# Patient Record
Sex: Male | Born: 1935 | Race: White | Hispanic: No | Smoking: Former smoker
Health system: Southern US, Community
[De-identification: ages and names within clinical notes are randomized; demographics above are authoritative.]

## PROBLEM LIST (undated history)

## (undated) DIAGNOSIS — I6529 Occlusion and stenosis of unspecified carotid artery: Secondary | ICD-10-CM

## (undated) DIAGNOSIS — I10 Essential (primary) hypertension: Secondary | ICD-10-CM

## (undated) DIAGNOSIS — K5792 Diverticulitis of intestine, part unspecified, without perforation or abscess without bleeding: Secondary | ICD-10-CM

## (undated) DIAGNOSIS — D62 Acute posthemorrhagic anemia: Secondary | ICD-10-CM

## (undated) DIAGNOSIS — J449 Chronic obstructive pulmonary disease, unspecified: Secondary | ICD-10-CM

## (undated) DIAGNOSIS — I639 Cerebral infarction, unspecified: Secondary | ICD-10-CM

## (undated) DIAGNOSIS — I2510 Atherosclerotic heart disease of native coronary artery without angina pectoris: Secondary | ICD-10-CM

## (undated) DIAGNOSIS — F17201 Nicotine dependence, unspecified, in remission: Secondary | ICD-10-CM

## (undated) DIAGNOSIS — K625 Hemorrhage of anus and rectum: Secondary | ICD-10-CM

## (undated) DIAGNOSIS — R933 Abnormal findings on diagnostic imaging of other parts of digestive tract: Secondary | ICD-10-CM

## (undated) DIAGNOSIS — C649 Malignant neoplasm of unspecified kidney, except renal pelvis: Secondary | ICD-10-CM

## (undated) DIAGNOSIS — M1990 Unspecified osteoarthritis, unspecified site: Secondary | ICD-10-CM

## (undated) HISTORY — DX: Malignant neoplasm of unspecified kidney, except renal pelvis: C64.9

## (undated) HISTORY — DX: Chronic obstructive pulmonary disease, unspecified: J44.9

## (undated) HISTORY — PX: KIDNEY SURGERY: SHX687

## (undated) HISTORY — PX: OTHER SURGICAL HISTORY: SHX169

## (undated) HISTORY — PX: COLON SURGERY: SHX602

---

## 1997-07-27 ENCOUNTER — Ambulatory Visit (HOSPITAL_COMMUNITY): Admission: RE | Admit: 1997-07-27 | Discharge: 1997-07-27 | Payer: Self-pay | Admitting: Internal Medicine

## 1999-01-02 ENCOUNTER — Encounter: Payer: Self-pay | Admitting: Internal Medicine

## 1999-01-02 ENCOUNTER — Emergency Department (HOSPITAL_COMMUNITY): Admission: EM | Admit: 1999-01-02 | Discharge: 1999-01-02 | Payer: Self-pay | Admitting: Emergency Medicine

## 1999-01-02 ENCOUNTER — Encounter: Payer: Self-pay | Admitting: General Surgery

## 1999-12-23 ENCOUNTER — Encounter: Payer: Self-pay | Admitting: Orthopedic Surgery

## 1999-12-30 ENCOUNTER — Encounter: Payer: Self-pay | Admitting: Orthopedic Surgery

## 1999-12-30 ENCOUNTER — Inpatient Hospital Stay (HOSPITAL_COMMUNITY): Admission: RE | Admit: 1999-12-30 | Discharge: 2000-01-03 | Payer: Self-pay | Admitting: Orthopedic Surgery

## 2003-03-07 ENCOUNTER — Encounter: Admission: RE | Admit: 2003-03-07 | Discharge: 2003-03-07 | Payer: Self-pay | Admitting: Orthopedic Surgery

## 2003-04-05 ENCOUNTER — Encounter: Admission: RE | Admit: 2003-04-05 | Discharge: 2003-04-05 | Payer: Self-pay | Admitting: Orthopedic Surgery

## 2004-06-03 ENCOUNTER — Encounter: Admission: RE | Admit: 2004-06-03 | Discharge: 2004-06-03 | Payer: Self-pay | Admitting: Orthopedic Surgery

## 2005-04-27 ENCOUNTER — Encounter: Admission: RE | Admit: 2005-04-27 | Discharge: 2005-04-27 | Payer: Self-pay | Admitting: Neurological Surgery

## 2005-05-14 ENCOUNTER — Inpatient Hospital Stay (HOSPITAL_COMMUNITY): Admission: RE | Admit: 2005-05-14 | Discharge: 2005-05-21 | Payer: Self-pay | Admitting: Neurological Surgery

## 2005-05-27 ENCOUNTER — Ambulatory Visit (HOSPITAL_COMMUNITY): Admission: RE | Admit: 2005-05-27 | Discharge: 2005-05-27 | Payer: Self-pay | Admitting: Neurological Surgery

## 2005-05-27 ENCOUNTER — Encounter: Payer: Self-pay | Admitting: Vascular Surgery

## 2006-01-20 ENCOUNTER — Ambulatory Visit (HOSPITAL_COMMUNITY): Admission: RE | Admit: 2006-01-20 | Discharge: 2006-01-20 | Payer: Self-pay | Admitting: Internal Medicine

## 2008-03-16 ENCOUNTER — Encounter: Admission: RE | Admit: 2008-03-16 | Discharge: 2008-03-16 | Payer: Self-pay | Admitting: Internal Medicine

## 2009-02-23 DIAGNOSIS — I639 Cerebral infarction, unspecified: Secondary | ICD-10-CM

## 2009-02-23 HISTORY — DX: Cerebral infarction, unspecified: I63.9

## 2010-07-11 NOTE — Discharge Summary (Signed)
[facility]  Patient:    Wayne Meadows, Wayne Meadows                     MRN: [id number] Adm. Date:  [id number] Disc. Date: [id number] Attending:  Tarri Glenn Page Dictator:   Alexzandrew L. Perkins, P.A.-C.                           Discharge Summary  ADMITTING DIAGNOSES: 1. Avascular necrosis, right femoral head. 2. Hypertension. 3. Tobacco dependency.  DISCHARGE DIAGNOSES: 1. Avascular necrosis, right femoral head, status post right total hip    replacement arthroplasty. 2. Postoperative hemorrhagic anemia. 3. Hypertension. 4. Tobacco dependency.  PROCEDURE:  Patient was taken to the operating room on December 30, 1999, and underwent right total hip replacement arthroplasty.  Surgeon:  Tarri Glenn, M.D.  Assistant:  Basil Dess, M.D.  Surgery under spinal anesthesia.  CONSULTS:  None.  HISTORY OF PRESENT ILLNESS:  Patient is a 76 year old male who has been followed for and seen in second opinion regarding his right hip.  He has had increasing pain for some time now.  MRI was performed in January 2001 which demonstrated some mild osteonecrosis involving the right femoral head.  He was felt to have some mild flattening of the femoral head with 40% articular surface involved anteriorly.  He has tried conservative measures including NSAIDS with no relief.  He has had increasing pain and debility and it is felt he would benefit from undergoing surgical intervention.  Patient was subsequently admitted to the hospital.  LABORATORY DATA:  CBC on admission showed a hemoglobin of 12.1, hematocrit 35.6, white cell count elevated at 10.6, red cell count 3.84.  Serial H&Hs were followed.  Last noted hemoglobin and hematocrit at 9.1 and hematocrit of 25.9.  PT and PTT on admission were 14.1 and 30 respectively, with an INR of 1.2.  Serial pro times followed per Coumadin protocol.  Last noted PT-INR was 19.4 and 2.1 respectively.  Chemistry panel on admission:   Elevated glucose of 124. Remaining chemistry panel all within normal limits.  Follow-up BMET on 12/31/99:  Glucose remained stable at 124.  Remaining BMET all within normal limits.  Urinalysis on admission positive for protein and ______ crystals, and 0 to 5 white cells, otherwise urine was negative.  Blood group type O positive.  Chest x-ray:  No active disease, no interval change since previous exam.  This was dated December 23, 1999.  Right hip film shows osteoarthritic changes over her right hip.  HOSPITAL COURSE:  Patient was admitted to [facility], taken to the operating room, underwent the above-stated procedure without complications. Patient tolerated the procedure well, was later returned to the recovery room and then to the orthopedic floor for continued postoperative care.  Patient was placed on PC analgesia for pain control following surgery.  He was weaned off his PCA by postoperative day #2.  Dressing changes were initiated and, on postoperative day #2, wound was healing well.  He did have some slight postoperative temperature noted on postoperative day #2 of 101.9, treated with an incentive spirometer and antipyretics.  Physical therapy was consulted postoperatively.  Patient progressed fairly well with physical therapy, only ambulating 20 feet by postoperative day #2, however increased up to 120 feet by postoperative day #3.  He was ambulating greater than 200 feet prior to discharge.  Patient was placed on Coumadin postoperatively.  He continued to progress with physical  therapy.  He was noted again to have a maximum temperature on the evening prior to admission of 101.2; however, temperature did come back down and was 99 prior to discharge.  Patient is doing quite well.  She has been weaned over to p.o. analgesics and it was decided that patient could be discharged home on January 03, 2000.  DISCHARGE PLAN:  Patient is discharged home on January 03, 2000.  DISCHARGE DIAGNOSES:  Please see above.  DISCHARGE MEDICATIONS: 1. Percocet p.r.n. pain. 2. Robaxin p.r.n. spasm. 3. Coumadin as per pharmacy protocol. 4. Colace as directed.  DIET:  As tolerated.  ACTIVITY:  Touchdown weightbearing.  Home health PT, home health nursing. Patient is placed on hip precautions.  FOLLOW-UP:  Follow up in two weeks.  DISPOSITION:  Home.  CONDITION UPON DISCHARGE:  Improved. DD:  02/04/00 TD:  02/04/00 Job: [id number]

## 2010-07-11 NOTE — H&P (Signed)
[facility]  Patient:    Wayne Meadows, Wayne Meadows [facility])                MRN: [id number] Adm. Date:  12/30/99 Attending:  Jeneen Rinks P. Aplington, M.D. Dictator:   Jenetta Loges, P.A. CC:         Kirtland Bouchard, M.D.   History and Physical  DATE OF BIRTH:  October 17, 1934  DATE OF HISTORY AND PHYSICAL:  December 22, 1999  CHIEF COMPLAINT:  Right hip pain.  HISTORY OF PRESENT ILLNESS:  This is a 76 year old male who was initially seen in our office for a second opinion regarding his right hip. He has had about a year of increasing right hip pain. Initially, he had MRI performed in March 03, 1999, which demonstrated some mild osteonecrosis involving the right femoral head. At that time, he was felt to have mild flattening of the femoral head with 40% articular surface involved anteriorly. He was tried on some NSAIDs with no relief. Over the past 8 months, he has had increasing pain and disability. He was seen in our office 6 months later. Follow-up x-rays were obtained which demonstrated collapse of the femoral head and avascular necrosis much more apparent with significant joint destruction. Since that time, the patient has been maintained on nonsteroidals. A total hip replacement was discussed with him at length. He waited several months. However, he was significant night pain, start up pain, and the disability secondary to his hip and wished to proceed with total hip arthroplasty. Risks, benefits discussed with the patient. He did donate 2 units of autologous blood for the procedure in preparation.  PAST MEDICAL HISTORY: 1. Hypertension. 2. Tobacco dependence.  PAST SURGICAL HISTORY:  Appendectomy.  CURRENT MEDICATIONS: 1. Accupril 20 mg q.d. 2. Mobic 7.5 mg b.i.d. 3. Glucosamine sulfate 1000 mg b.i.d. 4. Vitamins over-the-counter, as well as vitamin E over-the-counter.  FAMILY HISTORY:  Significant for mother with heart disease.  SOCIAL HISTORY:   Smokes at least a pack per day. Social alcohol intake. Primary physician is Kirtland Bouchard, M.D. who has provided medical clearance. He also lives in a three-level home but can be maintained on one level. Living circumstances postoperatively:  He is married and has a wife that will assist him postoperatively.  REVIEW OF SYSTEMS:  The patient denies any recent fevers, chills, night sweats. No bleeding tendencies. CNS:  No blurred or double vision, seizures, or paralysis. RESPIRATORY:  No shortness of breath, productive cough, or hemoptysis. CARDIOVASCULAR:  No chest pain, angina, orthopnea. GI:  No nausea, vomiting, constipation, melena, bloody stools. GENITOURINARY:  No dysuria or hematuria. MUSCULOSKELETAL:  As pertinent to present illness.  PHYSICAL EXAMINATION:  GENERAL:  Well-developed, well-nourished, 76 year old male. Walks with an antalgic gait.  VITAL SIGNS:  Blood pressure 158/82.  HEENT:  Normocephalic. Extraocular motions intact.  NECK:  Supple. No lymphadenopathy. No carotid bruits appreciated on exam.  CHEST:  Clear to auscultation bilaterally. No rales or rhonchi.  HEART:  Regular rate and rhythm. No murmurs, rubs, gallops, heaves, or thrills.  ABDOMEN:  Positive bowel sounds. Soft, nontender.  EXTREMITIES:  Right lower extremity shows decreased range of motion. Internal rotation significantly limited. Positive intact distal pulses.  IMPRESSION: 1. Avascular necrosis right femoral head. 2. Hypertension. 3. Tobacco dependence.  PLAN:  The patient will be admitted for right total hip arthroplasty. DD:  12/23/99 TD:  12/23/99 Job: [id number]

## 2010-07-11 NOTE — Discharge Summary (Signed)
NAME:  FOYE, COMAN NO.:  [id number]   MEDICAL RECORD NO.:  [id number]          PATIENT TYPE:  INP   LOCATION:  [id number]                         FACILITY:  [facility]   PHYSICIAN:  Earleen Newport, M.D.  DATE OF BIRTH:  11-01-1934   DATE OF ADMISSION:  05/14/2005  DATE OF DISCHARGE:  05/21/2005                                 DISCHARGE SUMMARY   ADMITTING DIAGNOSES:  1.  Spondylosis and herniated nucleus pulposus, L2-L3 and L3-L4.  2.  Spondylosis, L4-L5 and L5-S1 with right lumbar radiculopathy.   DISCHARGE AND FINAL DIAGNOSES:  1.  Spondylosis and herniated nucleus pulposus, L2-L3 and L3-L4.  2.  Spondylosis, L4-L5 and L5-S1 with right lumbar radiculopathy.  3.  Urinary retention.  4.  Acute blood loss anemia secondary to surgery.  5.  Postoperative ileus.   CONDITION ON DISCHARGE:  Improving.   HOSPITAL COURSE:  Mr. Beaver Taub is a 76 year old individual who has had  significant back and right lower extremity pain.  He was found to have  severely degenerated hip disease and underwent a hip arthroplasty several  years ago but he has had kept right buttock and hip pain that has been  unrelieved with conservative measures including evaluation and treatment  with epidural steroid injections.  An MRI demonstrated he had significant  disc degeneration with chronically herniated discs at L2-L3 and L3-L4.  He  had severely degenerated discs at L4-L5 and L5-S1.  He was advised regarding  surgical decompression and stabilization from L2 to the sacrum and was  admitted on March 22 and underwent this procedure.  He had segmental  fixation from L2 to the sacrum with interbody fixation and decompression, L2-  L3 and L3-L4.   Since the time of surgery, the patient has been gradually mobilized.  He had  acute blood loss anemia and had urinary retention.  He required the  placement of a Foley catheter which was removed on the second postoperative  day but then was replaced  on day 3 through day 5.  He was started on Flomax  and this gradually improved his respiratory status.  His incision has been  clean and dry.  He has been voiding spontaneously.  He had a brief case of a  postoperative ileus and this is resolved.  He has been seen by physical  therapy and has been given a rolling walker and a 3-in-1 for home use.  He  will be sent home on Percocet #60 without refills and Flexeril #30, 10 mg  one t.i.d. p.r.n. with p.r.n. refills.  He will be seen in the office in  three weeks' time.  His incision is clean and dry.  His staples have been  removed and the wound was dressed with Steri-Strips.   CONDITION ON DISCHARGE:  Improving.     Earleen Newport, M.D.  Electronically Signed    HJE/MEDQ  D:  05/21/2005  T:  05/22/2005  Job:  [id number]

## 2010-07-11 NOTE — Op Note (Signed)
NAME:  Wayne Meadows, Wayne Meadows NO.:  [id number]   MEDICAL RECORD NO.:  [id number]          PATIENT TYPE:  INP   LOCATION:  3310                         FACILITY:  [facility]   PHYSICIAN:  Earleen Newport, M.D.  DATE OF BIRTH:  January 18, 1935   DATE OF PROCEDURE:  05/14/2005  DATE OF DISCHARGE:                                 OPERATIVE REPORT   PREOPERATIVE DIAGNOSES:  1.  Spondylosis and herniated nucleus pulposus L2-3, L3-4.  2.  Spondylosis L4-5 and L5-S1 with right lumbar radiculopathy.   POSTOPERATIVE DIAGNOSES:  1.  Spondylosis and herniated nucleus pulposus L2-3, L3-4.  2.  Spondylosis L4-5 and L5-S1 with right lumbar radiculopathy.   OPERATIONS:  Bilateral diskectomy L2-3, L3-4 posterior interbody arthrodesis  with peak bone spacer, local autograft and allograft L2-3, L3-4, segmental  fixation L2 to sacrum with pedicle screws and posterolateral arthrodesis  with autograft, allograft and Infuse L2 to sacrum.   SURGEON:  Earleen Newport, M.D.   FIRST ASSISTANT:  Elizabeth Sauer, M.D.   ANESTHESIA:  General endotracheal.   INDICATIONS:  [name] is a 76 year old individual who has had  significant back and right lower extremity pain.  He initially was found to  have a severely degenerated hip and underwent hip arthroplasty.  However, he  had a radicular pain in the right buttock and right hip.  He failed  extensive efforts at conservative management though he did get some brief  temporary relief with the use of epidural steroids.  He was evaluated with a  new RI of his lumbar spine and advised regarding surgical decompression that  would involve L2 down to the sacrum.  He is now taken to the operating room  for this procedure.   DESCRIPTION OF PROCEDURE:  The patient was brought to the operating room  supine on the stretcher.  After the smooth induction of general endotracheal  anesthesia, he was turned prone with all the bony prominences being  carefully  padded and protected.  The back was then shaved with a clipper and  prepped with DuraPrep and draped in sterile fashion.  A midline incision was  created over the lower lumbar spine.  Dissection was carried down to lumbar  dorsal fashion.  The first spinous processes were identified as L3-L4 with  localizing radiograph.  Then the interlaminar spaces at L3-4, L4-5 and L5-S1  and L2-3 were cleared using monopolar cautery and periosteal elevators and  packing the wound to the side.  Once this area was uncovered, the facet  joints at L2-3, L3-4, L4-5 and L5-S1 were uncovered, and then a laminotomy  was created removing the inferior margin lamina of L2 out to and including  the entirety of the facet which was also done at L3.  This procedure was  performed bilaterally.  The epidural tissues were then dissected and  epidural veins were cauterized and divided and the common dural tube was  retracted medially.  There was a  broad based bulge at the L2-3 level with a  partially calcified disk protrusion at this level, and this was opened and  removed in a piecemeal fashion with a combination of Kerrison rongeurs and  Kerrison punches.  The disk space was then eventually evacuated in its  entirety using the dissecting tools.  L3-4 was then approached in a similar  fashion and again a complete diskectomy was performed. Once the disk had  been all dissected away of the endplates were prepared using a series of  curettes to remove any cartilaginous end plate that could be had.  A self-  retaining disk spreader was also used to expand the interspace and allow for  better visualization of the disk space itself.  Once this was completed, the  interspaces were sized and 10 mm Peak spaces were placed with bone within  them at the L2-3 level.  The 9 mm spacers were placed at L3-4.  On the right  lateral gutter, there was noted to be a substantial protrusion of the disk  in the extraforaminal space at L3-4, and  this was resected.  The disk  herniation was definitively much larger at the L3-4 level than it was at L2-  3.  Once the placement of the peak spacers with the bone graft was  completed, the bone graft had been prepared with the autograft from the  laminotomies mixed with 10 cc of demineralized bone matrix putty.  The  interspace was also packed with a strip of Infuse.  With this being  completed, the right sided lateral recess was cleared with a combination of  curets and rongeurs and the common dural tube on the right side was  dissected free.  The exiting nerve root at L5 was cleared on the right side  only as the patient had primarily right lumbar radiculopathy.  The right-  sided S1 nerve root was similarly cleared by a laminotomy at the L5-S1  level.  To allow better placement of pedicle screws inferior portion of the  facet joints at L4-5, L5-S1 were opened, and the inferior portion of the  facet was removed itself.  This was prepared for bone graft, and the face of  the facet joint was completely decorticated very carefully to allow more  placement of bone graft material.  Then with fluoroscopy being brought into  the field, pedicle screws were placed at the sacrum L5, L4, L3 and L2 up one  side and then down the other.  Care was taken to make sure that the  placement of pedicle screws was symmetric and within the pedicle and each  pedicle was not only checked radiographically, but also sounded after being  tapped to the 6.5 mm size.  The 6.5 mm x 45 mm screws were used for this  purpose.  Once this procedure was completed and final fluoroscopic views  were obtained, the rods were then contoured to fit between the screws from  L2 down to the sacrum, and the rods were provisionally placed and then the  screws were tightened from the outside in using the appropriate torque  wrench.  The lateral gutters had been decorticated prior to placement of the hardware, and then these  decorticated areas were then packed with the  remainder of autograft and allograft and Infuse strips.  In addition, along  the posterior lateral borders two strips of VITOSS bone matrix demineralized  tricalcium phosphate sponges were placed in the lateral gutters.  The  sponges had been soaked with a bone marrow aspirate that had been obtained  from the multiple pedicle probes that were placed, and the Jamshidi needle  was used to remove the bone marrow aspirate for a total of 2 cc at each  pedicle site.  Once all the hardware was placed and torqued down and the  bone graft was placed, care was taken to make sure that the lateral recess  and lateral gutters were well decompressed.  The nerve roots themselves in  the exit foramen were covered with some small pledgets of Gelfoam that were  soaked in thrombin and once hemostasis was achieved they were left in place.  The lumbodorsal fascia was then closed with #1 Vicryl interrupted fashion; 2-  0 Vicryl was used in the subcutaneous tissues, 3-0 Vicryl subcuticularly and  surgical staples were ultimately placed on the skin.  The patient tolerated  the procedure well.  Blood loss estimate about 1000 mL, and 400 mL of  CellSaver blood was returned to the patient. During the procedure care was  taken to perform laminotomies, however, the spinous process of L5 and the  spinous process of L2 had each fractured off.  These fractured portions were  harvested and used for bone graft.      Earleen Newport, M.D.  Electronically Signed     HJE/MEDQ  D:  05/14/2005  T:  05/16/2005  Job:  [id number]
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
## 2010-07-11 NOTE — Op Note (Signed)
[facility]  Patient:    Wayne Meadows, Wayne Meadows                     MRN: [id number] Proc. Date: 12/30/99 Adm. 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  OPERATION PERFORMED:  Osteonics total hip replacement, right.  SURGEON:  Dr. Shellia Carwin.  ASSISTANT:  Dr. Basil Dess.  ANESTHESIA:  Spinal.  PATHOLOGY AND JUSTIFICATION FOR PROCEDURE:  As stated in the diagnosis. He is age 76 with good bone ______ of the non-cemented hip was the treatment of choice.  DESCRIPTION OF PROCEDURE:  Prophylactic antibiotics, satisfactory spinal anesthesia, Foley catheter inserted, left lateral decubitus position on the Mark 2 frame, right hip was prepped with duraprep, draped in a sterile field. Ioban employed, posterolateral incision down to the fascia lata. Zickels band was cut. External rotators were detached off the femur with cutting cautery. The gluteus medius was partially detached and tagged with #1 Ethibond. The piriformis was likewise identified, detached and tagged with #1 Ethibond. The hip capsule was then identified and subtotal capsulectomy performed and the hip dislocated. I made my initial cut of the femoral neck just below the femoral head and cleared the piriformis fossa of soft tissue. I then placed a guide pin down in the femoral canal over drilling it followed by the canal finder. I then began the cylindrical reaming process and also the broaching to determined proper sizing. Along the way, I used the greater trochanteric reamer and also made my definitive cut on the femoral neck a finger breadth above the lesser trochanter. We went up to a size 10 which gave a nice fit with minimal cancellous bone off the calcar. We then reamed for the distal tip at a 14  tip reaming up to 14.5 mm. I then went to the acetabulum where the capsulectomy was completed. He had a very deep fossa ovale which was cleared of soft tissue. The plain x-ray was a little deceiving in that the acetabular wall looked quite thick but after leveling off the inner acetabulum down to the level of the fossa ovale, there is basically no bone left at this point. Consequently after using an expanding reamer up to a 62, I took a portion of the second bone I got from the femoral neck removing some of the cortical bone and thinning it out and tailored it to fit the defect in the acetabular wall. I then filled the acetabulum with morcellized cancellous bone from the femoral head. I went through a trial reduction with a +10 cut liner at 12 oclock and this gave a nice stable hip with a +5 neck. Consequently I went ahead and placed the final metal back acetabulum secured with 2 screws superiorly, 1 at 40 mm and 1 at 35 mm. I then went through another trial reduction with the trial liner once again at 12 oclock and found the hip to be very stable and I consequently placed my final polyethylene cup in this position. I then returned to the femur which I irrigated well with antibiotic solution and then I gently placed in the microstructured 127 degree primary secure fit femur until we could not impact it anymore. I then  checked reduction once again with a plus 5 neck and found this to be optimally tight. Prior to the surgery, he had measured about 1/4 inch short and I told him that we would try to equalize his leg lengths as close as possible but that the stability of the fit was the most important thing. I then went ahead and put the final C tapered head impacting it and reducing the hip and taking it through a full range of motion with good stability. The wound was then once again irrigated with antibiotic solution, Zickels band was reconstituted with interrupted #1 Vicryl. Piriformis and  gluteus medius were reattached with #1 Ethibond with the gluteus medius through drill holes. Also reattached external rotators with #1 Vicryl through drill holes. The wound was then closed with interrupted #1 Vicryl and the fascia lata and deep subcutaneous tissue with 2-0 Vicryl and superficial subcutaneous tissue and staples in the skin. Betadine Adaptic dry sterile dressing were applied. He was then gently placed on his back and in an abduction pillow. He was taken to the recovery room in satisfactory condition with no known complications. Estimated blood loss was approximately 300 cc with no blood replacement. DD:  12/30/99 TD:  12/30/99 Job: [id number]

## 2011-02-21 ENCOUNTER — Other Ambulatory Visit: Payer: Self-pay

## 2011-02-21 ENCOUNTER — Emergency Department (HOSPITAL_COMMUNITY): Payer: Medicare Other

## 2011-02-21 ENCOUNTER — Encounter: Payer: Self-pay | Admitting: *Deleted

## 2011-02-21 ENCOUNTER — Inpatient Hospital Stay (HOSPITAL_COMMUNITY)
Admission: EM | Admit: 2011-02-21 | Discharge: 2011-02-25 | DRG: 249 | Disposition: A | Discharge status: Home / Self Care | Payer: Medicare Other | Attending: Cardiology | Admitting: Cardiology

## 2011-02-21 DIAGNOSIS — I200 Unstable angina: Secondary | ICD-10-CM

## 2011-02-21 DIAGNOSIS — I2583 Coronary atherosclerosis due to lipid rich plaque: Secondary | ICD-10-CM | POA: Insufficient documentation

## 2011-02-21 DIAGNOSIS — J449 Chronic obstructive pulmonary disease, unspecified: Secondary | ICD-10-CM

## 2011-02-21 DIAGNOSIS — Z87891 Personal history of nicotine dependence: Secondary | ICD-10-CM

## 2011-02-21 DIAGNOSIS — I679 Cerebrovascular disease, unspecified: Secondary | ICD-10-CM | POA: Diagnosis present

## 2011-02-21 DIAGNOSIS — J4489 Other specified chronic obstructive pulmonary disease: Secondary | ICD-10-CM | POA: Diagnosis present

## 2011-02-21 DIAGNOSIS — Z9861 Coronary angioplasty status: Secondary | ICD-10-CM

## 2011-02-21 DIAGNOSIS — F17201 Nicotine dependence, unspecified, in remission: Secondary | ICD-10-CM

## 2011-02-21 DIAGNOSIS — R935 Abnormal findings on diagnostic imaging of other abdominal regions, including retroperitoneum: Secondary | ICD-10-CM

## 2011-02-21 DIAGNOSIS — I214 Non-ST elevation (NSTEMI) myocardial infarction: Principal | ICD-10-CM | POA: Diagnosis present

## 2011-02-21 DIAGNOSIS — I2510 Atherosclerotic heart disease of native coronary artery without angina pectoris: Secondary | ICD-10-CM | POA: Insufficient documentation

## 2011-02-21 DIAGNOSIS — I2111 ST elevation (STEMI) myocardial infarction involving right coronary artery: Secondary | ICD-10-CM | POA: Diagnosis present

## 2011-02-21 DIAGNOSIS — Z8673 Personal history of transient ischemic attack (TIA), and cerebral infarction without residual deficits: Secondary | ICD-10-CM

## 2011-02-21 DIAGNOSIS — I2582 Chronic total occlusion of coronary artery: Secondary | ICD-10-CM | POA: Diagnosis present

## 2011-02-21 DIAGNOSIS — J441 Chronic obstructive pulmonary disease with (acute) exacerbation: Secondary | ICD-10-CM

## 2011-02-21 HISTORY — DX: Essential (primary) hypertension: I10

## 2011-02-21 HISTORY — DX: Nicotine dependence, unspecified, in remission: F17.201

## 2011-02-21 HISTORY — DX: Atherosclerotic heart disease of native coronary artery without angina pectoris: I25.10

## 2011-02-21 HISTORY — DX: Cerebral infarction, unspecified: I63.9

## 2011-02-21 MED ORDER — NITROGLYCERIN 0.4 MG SL SUBL
0.4000 mg | SUBLINGUAL_TABLET | SUBLINGUAL | Status: AC | PRN
  Administered 2011-02-22 (×3): 0.4 mg via SUBLINGUAL
  Filled 2011-02-21: qty 25

## 2011-02-21 MED ORDER — ASPIRIN 81 MG PO CHEW
324.0000 mg | CHEWABLE_TABLET | Freq: Once | ORAL | Status: AC
  Administered 2011-02-21: 324 mg via ORAL
  Filled 2011-02-21: qty 4

## 2011-02-21 MED ORDER — SODIUM CHLORIDE 0.9 % IV SOLN
Freq: Once | INTRAVENOUS | Status: AC
  Administered 2011-02-21: via INTRAVENOUS

## 2011-02-21 NOTE — ED Notes (Signed)
Pt in c/o intermittent chest pain over last two days, states pain has increased tonight and will not go away, states pain is to central chest and radiates into back, pt took SL nitro PTA but states they were old, states some relief after taking nitro but pain has returned.

## 2011-02-21 NOTE — ED Notes (Signed)
Patient transported to X-ray 

## 2011-02-21 NOTE — ED Provider Notes (Signed)
History     CSN: [id number]  Arrival date & time 02/21/11  2255   First MD Initiated Contact with Patient 02/21/11 2308      Chief Complaint  Patient presents with  . Chest Pain    (Consider location/radiation/quality/duration/timing/severity/associated sxs/prior treatment) HPI Comments: 76 year old male with a history of coronary artery disease status post coronary stenting in 2009, TIA/stroke in 2009, hypertension who presents with substernal chest pain radiating to his mid back between the shoulder blades. Onset of symptoms was gradual and approximately one week ago. This has been intermittent, occurs at night but also during the day for the last few days. It is worse with exertion noting that it is hard to walk without pain. He has no associated shortness of breath, nausea, vomiting, swelling of the lower extremities, cough, fever or other complaints. He has taken sublingual nitroglycerin at home several times with some improvement but tonight the pain has persisted. He has also taken Alka-Seltzer several times sometimes with improvement in symptoms not.  Stent placed in [place] Stroke treated in [facility] Primary care physician, [place] in [place]  Patient is a 76 y.o. male presenting with chest pain. The history is provided by the patient and medical records.  Chest Pain     Past Medical History  Diagnosis Date  . Hypertension   . Stroke   . Coronary artery disease     Past Surgical History  Procedure Date  . Hip relacement   . Brain surgery   . Kidney surgery     History reviewed. No pertinent family history.  History  Substance Use Topics  . Smoking status: Never Smoker   . Smokeless tobacco: Not on file  . Alcohol Use: Yes      Review of Systems  Cardiovascular: Positive for chest pain.  All other systems reviewed and are negative.    Allergies  Bee venom  Home Medications   Current Outpatient Rx  Name Route Sig Dispense Refill  .  IPRATROPIUM-ALBUTEROL 18-103 MCG/ACT IN AERO Inhalation Inhale 2 puffs into the lungs every 6 (six) hours as needed.      . ASPIRIN EC 81 MG PO TBEC Oral Take 81 mg by mouth daily.      . CHOLECALCIFEROL 400 UNITS PO TABS Oral Take 2,000 Units by mouth daily.      Marland Kitchen DILTIAZEM HCL ER COATED BEADS 300 MG PO CP24 Oral Take 300 mg by mouth daily.      . ASPIRIN-DIPYRIDAMOLE 25-200 MG PO CP12 Oral Take 1 capsule by mouth 2 (two) times daily.      . ENALAPRIL MALEATE 20 MG PO TABS Oral Take 20 mg by mouth daily.      Marland Kitchen FLAXSEED OIL 1000 MG PO CAPS Oral Take 1,000 mg by mouth daily.      Marland Kitchen HYPROMELLOSE 2.5 % OP SOLN  1 drop.      . MOMETASONE FUROATE 220 MCG/INH IN AEPB Inhalation Inhale 1 puff into the lungs at bedtime.      Marland Kitchen NITROGLYCERIN 0.4 MG SL SUBL Sublingual Place 0.4 mg under the tongue every 5 (five) minutes as needed.      . OMEGA-3-ACID ETHYL ESTERS 1 G PO CAPS Oral Take 1 g by mouth 2 (two) times daily.      Marland Kitchen SIMVASTATIN 80 MG PO TABS Oral Take 80 mg by mouth at bedtime.        BP 129/67  Pulse 86  Temp(Src) 97.8 F (36.6 C) (Oral)  Resp  14  SpO2 95%  Physical Exam  Nursing note and vitals reviewed. Constitutional: He appears well-developed and well-nourished. No distress.  HENT:  Head: Normocephalic and atraumatic.  Mouth/Throat: Oropharynx is clear and moist. No oropharyngeal exudate.  Eyes: Conjunctivae and EOM are normal. Pupils are equal, round, and reactive to light. Right eye exhibits no discharge. Left eye exhibits no discharge. No scleral icterus.  Neck: Normal range of motion. Neck supple. No JVD present. No thyromegaly present.  Cardiovascular: Normal rate, regular rhythm, normal heart sounds and intact distal pulses.  Exam reveals no gallop and no friction rub.   No murmur heard. Pulmonary/Chest: Effort normal and breath sounds normal. No respiratory distress. He has no wheezes. He has no rales.  Abdominal: Soft. Bowel sounds are normal. He exhibits no distension  and no mass. There is no tenderness.  Musculoskeletal: Normal range of motion. He exhibits no edema and no tenderness.  Lymphadenopathy:    He has no cervical adenopathy.  Neurological: He is alert. Coordination normal.  Skin: Skin is warm and dry. No rash noted. No erythema.  Psychiatric: He has a normal mood and affect. His behavior is normal.    ED Course  Procedures (including critical care time)  Labs Reviewed  CBC - Abnormal; Notable for the following:    WBC 12.7 (*)    All other components within normal limits  DIFFERENTIAL - Abnormal; Notable for the following:    Neutro Abs 9.3 (*)    Monocytes Absolute 1.1 (*)    All other components within normal limits  APTT - Abnormal; Notable for the following:    aPTT 41 (*)    All other components within normal limits  POCT I-STAT, CHEM 8 - Abnormal; Notable for the following:    Glucose, Bld 122 (*)    All other components within normal limits  PROTIME-INR  POCT I-STAT TROPONIN I  I-STAT TROPONIN I  I-STAT, CHEM 8   Dg Chest Port 1 View  02/21/2011  *RADIOLOGY REPORT*  Clinical Data: Chest pain and hypertension.  PORTABLE CHEST - 1 VIEW  Comparison: 01/20/2006  Findings: Hyperinflation/COPD. Midline trachea.  Normal heart size and mediastinal contours.  Costophrenic angles are minimally excluded.  Given this factor, no pleural fluid. No pneumothorax. Bibasilar scarring.  IMPRESSION: COPD with bibasilar scarring. No acute superimposed process.  Minimal exclusion of the costophrenic angles bilaterally.  Original Report Authenticated By: Areta Haber, M.D.     1. Unstable angina       MDM  No acute findings on physical exam, clear heart sounds, clear lung sounds, no peripheral edema. EKG abnormal with nonspecific ST abnormalities in the inferior and lateral precordial leads. No ST elevation at this time. We'll proceed with chest x-ray, labs, nitroglycerin, aspirin.  ED ECG REPORT   Date: 02/22/2011   Rate: 92  Rhythm:  normal sinus rhythm  QRS Axis: normal  Intervals: PR prolonged  ST/T Wave abnormalities: nonspecific ST changes  Conduction Disutrbances:none  Narrative Interpretation:   Old EKG Reviewed: changes noted and Abnormal ST segments new from 05/11/2005  Patient reevaluated at 1:00 AM, is currently nearly chest pain-free after second dose of sublingual nitroglycerin. Heparin ordered for likely unstable angina with EKG changes. I have discussed the case with the cardiologist on call who will see the patient in the ER and admit.  CRITICAL CARE Performed by: Johnna Acosta   Total critical care time: 35  Critical care time was exclusive of separately billable procedures and treating other patients.  Critical care was necessary to treat or prevent imminent or life-threatening deterioration.  Critical care was time spent personally by me on the following activities: development of treatment plan with patient and/or surrogate as well as nursing, discussions with consultants, evaluation of patient's response to treatment, examination of patient, obtaining history from patient or surrogate, ordering and performing treatments and interventions, ordering and review of laboratory studies, ordering and review of radiographic studies, pulse oximetry and re-evaluation of patient's condition.        Johnna Acosta, MD 02/22/11 [phone]

## 2011-02-21 NOTE — ED Notes (Signed)
MD at bedside. 

## 2011-02-22 ENCOUNTER — Other Ambulatory Visit: Payer: Self-pay

## 2011-02-22 ENCOUNTER — Encounter (HOSPITAL_COMMUNITY): Payer: Self-pay | Admitting: Cardiovascular Disease

## 2011-02-22 ENCOUNTER — Emergency Department (HOSPITAL_COMMUNITY): Payer: Medicare Other

## 2011-02-22 DIAGNOSIS — J449 Chronic obstructive pulmonary disease, unspecified: Secondary | ICD-10-CM

## 2011-02-22 DIAGNOSIS — I679 Cerebrovascular disease, unspecified: Secondary | ICD-10-CM

## 2011-02-22 DIAGNOSIS — I2510 Atherosclerotic heart disease of native coronary artery without angina pectoris: Secondary | ICD-10-CM | POA: Insufficient documentation

## 2011-02-22 DIAGNOSIS — I214 Non-ST elevation (NSTEMI) myocardial infarction: Secondary | ICD-10-CM | POA: Diagnosis present

## 2011-02-22 DIAGNOSIS — F17201 Nicotine dependence, unspecified, in remission: Secondary | ICD-10-CM

## 2011-02-22 DIAGNOSIS — I2583 Coronary atherosclerosis due to lipid rich plaque: Secondary | ICD-10-CM | POA: Insufficient documentation

## 2011-02-22 DIAGNOSIS — R079 Chest pain, unspecified: Secondary | ICD-10-CM

## 2011-02-22 DIAGNOSIS — J441 Chronic obstructive pulmonary disease with (acute) exacerbation: Secondary | ICD-10-CM

## 2011-02-22 DIAGNOSIS — I2111 ST elevation (STEMI) myocardial infarction involving right coronary artery: Secondary | ICD-10-CM | POA: Diagnosis present

## 2011-02-22 DIAGNOSIS — Z8673 Personal history of transient ischemic attack (TIA), and cerebral infarction without residual deficits: Secondary | ICD-10-CM

## 2011-02-22 LAB — CBC
HCT: 40.9 % (ref 39.0–52.0)
Hemoglobin: 13.6 g/dL (ref 13.0–17.0)
MCH: 31 pg (ref 26.0–34.0)
MCV: 93.2 fL (ref 78.0–100.0)
RBC: 4.39 MIL/uL (ref 4.22–5.81)
WBC: 12.7 10*3/uL — ABNORMAL HIGH (ref 4.0–10.5)

## 2011-02-22 LAB — DIFFERENTIAL
Eosinophils Absolute: 0.3 10*3/uL (ref 0.0–0.7)
Eosinophils Relative: 2 % (ref 0–5)
Lymphocytes Relative: 16 % (ref 12–46)
Lymphs Abs: 2 10*3/uL (ref 0.7–4.0)
Monocytes Absolute: 1.1 10*3/uL — ABNORMAL HIGH (ref 0.1–1.0)
Monocytes Relative: 8 % (ref 3–12)

## 2011-02-22 LAB — POCT I-STAT, CHEM 8
Creatinine, Ser: 1 mg/dL (ref 0.50–1.35)
Glucose, Bld: 122 mg/dL — ABNORMAL HIGH (ref 70–99)
Hemoglobin: 14.6 g/dL (ref 13.0–17.0)
Potassium: 4 mEq/L (ref 3.5–5.1)

## 2011-02-22 MED ORDER — VITAMIN D3 25 MCG (1000 UNIT) PO TABS
2000.0000 [IU] | ORAL_TABLET | Freq: Every day | ORAL | Status: DC
  Administered 2011-02-22 – 2011-02-25 (×4): 2000 [IU] via ORAL
  Filled 2011-02-22 (×5): qty 2

## 2011-02-22 MED ORDER — ASPIRIN EC 81 MG PO TBEC
81.0000 mg | DELAYED_RELEASE_TABLET | Freq: Every day | ORAL | Status: DC
  Administered 2011-02-22: 81 mg via ORAL
  Filled 2011-02-22 (×2): qty 1

## 2011-02-22 MED ORDER — IPRATROPIUM-ALBUTEROL 18-103 MCG/ACT IN AERO
2.0000 | INHALATION_SPRAY | Freq: Four times a day (QID) | RESPIRATORY_TRACT | Status: DC | PRN
  Administered 2011-02-22: 2 via RESPIRATORY_TRACT
  Filled 2011-02-22: qty 14.7

## 2011-02-22 MED ORDER — NITROGLYCERIN 0.4 MG SL SUBL
SUBLINGUAL_TABLET | SUBLINGUAL | Status: AC
  Administered 2011-02-22: 0.4 mg
  Filled 2011-02-22: qty 25

## 2011-02-22 MED ORDER — MORPHINE SULFATE 2 MG/ML IJ SOLN
INTRAMUSCULAR | Status: AC
  Administered 2011-02-23: 1 mg via INTRAVENOUS
  Filled 2011-02-22: qty 1

## 2011-02-22 MED ORDER — HYPROMELLOSE (GONIOSCOPIC) 2.5 % OP SOLN
1.0000 [drp] | Freq: Three times a day (TID) | OPHTHALMIC | Status: DC
  Filled 2011-02-22 (×2): qty 15

## 2011-02-22 MED ORDER — FLAXSEED OIL 1000 MG PO CAPS: 1000.0000 mg | ORAL_CAPSULE | Freq: Every day | ORAL | Status: DC

## 2011-02-22 MED ORDER — ROSUVASTATIN CALCIUM 20 MG PO TABS
20.0000 mg | ORAL_TABLET | Freq: Every day | ORAL | Status: DC
  Administered 2011-02-22 – 2011-02-24 (×2): 20 mg via ORAL
  Filled 2011-02-22 (×5): qty 1

## 2011-02-22 MED ORDER — POLYVINYL ALCOHOL 1.4 % OP SOLN
1.0000 [drp] | Freq: Three times a day (TID) | OPHTHALMIC | Status: DC
  Administered 2011-02-22 – 2011-02-25 (×8): 1 [drp] via OPHTHALMIC
  Filled 2011-02-22: qty 15

## 2011-02-22 MED ORDER — IPRATROPIUM BROMIDE 0.02 % IN SOLN
0.5000 mg | Freq: Four times a day (QID) | RESPIRATORY_TRACT | Status: DC
  Administered 2011-02-22 – 2011-02-23 (×2): 0.5 mg via RESPIRATORY_TRACT
  Filled 2011-02-22 (×2): qty 2.5

## 2011-02-22 MED ORDER — ASPIRIN-DIPYRIDAMOLE ER 25-200 MG PO CP12
1.0000 | ORAL_CAPSULE | Freq: Two times a day (BID) | ORAL | Status: DC
  Administered 2011-02-22 – 2011-02-23 (×3): 1 via ORAL
  Filled 2011-02-22 (×7): qty 1

## 2011-02-22 MED ORDER — FLUTICASONE PROPIONATE HFA 44 MCG/ACT IN AERO
2.0000 | INHALATION_SPRAY | Freq: Two times a day (BID) | RESPIRATORY_TRACT | Status: DC
  Administered 2011-02-22 – 2011-02-25 (×6): 2 via RESPIRATORY_TRACT
  Filled 2011-02-22 (×2): qty 10.6

## 2011-02-22 MED ORDER — SODIUM CHLORIDE 0.9 % IJ SOLN: 3.0000 mL | INTRAMUSCULAR | Status: DC | PRN

## 2011-02-22 MED ORDER — NITROGLYCERIN IN D5W 200-5 MCG/ML-% IV SOLN
2.0000 ug/min | INTRAVENOUS | Status: DC
  Administered 2011-02-22: 5 ug/min via INTRAVENOUS
  Administered 2011-02-22: 50 ug/min via INTRAVENOUS
  Administered 2011-02-23: 60 ug/min via INTRAVENOUS
  Filled 2011-02-22 (×2): qty 250

## 2011-02-22 MED ORDER — ONDANSETRON HCL 4 MG/2ML IJ SOLN: 4.0000 mg | Freq: Four times a day (QID) | INTRAMUSCULAR | Status: DC | PRN

## 2011-02-22 MED ORDER — HEPARIN (PORCINE) IN NACL 100-0.45 UNIT/ML-% IJ SOLN
12.0000 [IU]/kg/h | INTRAMUSCULAR | Status: DC
  Administered 2011-02-22: 12 [IU]/kg/h via INTRAVENOUS
  Filled 2011-02-22: qty 250

## 2011-02-22 MED ORDER — NITROGLYCERIN 0.4 MG SL SUBL: 0.4000 mg | SUBLINGUAL_TABLET | SUBLINGUAL | Status: DC | PRN

## 2011-02-22 MED ORDER — MORPHINE SULFATE 2 MG/ML IJ SOLN
2.0000 mg | Freq: Once | INTRAMUSCULAR | Status: AC
  Administered 2011-02-22: 2 mg via INTRAVENOUS

## 2011-02-22 MED ORDER — OMEGA-3-ACID ETHYL ESTERS 1 G PO CAPS
1.0000 g | ORAL_CAPSULE | Freq: Two times a day (BID) | ORAL | Status: DC
  Administered 2011-02-22 – 2011-02-25 (×7): 1 g via ORAL
  Filled 2011-02-22 (×11): qty 1

## 2011-02-22 MED ORDER — SODIUM CHLORIDE 0.9 % IV SOLN: 250.0000 mL | INTRAVENOUS | Status: DC | PRN

## 2011-02-22 MED ORDER — NITROGLYCERIN 0.4 MG SL SUBL
0.4000 mg | SUBLINGUAL_TABLET | SUBLINGUAL | Status: AC | PRN
  Administered 2011-02-22 (×3): 0.4 mg via SUBLINGUAL
  Filled 2011-02-22: qty 75

## 2011-02-22 MED ORDER — HEPARIN BOLUS VIA INFUSION
4000.0000 [IU] | Freq: Once | INTRAVENOUS | Status: AC
  Administered 2011-02-22: 4000 [IU] via INTRAVENOUS
  Filled 2011-02-22: qty 4000

## 2011-02-22 MED ORDER — ENOXAPARIN SODIUM 100 MG/ML ~~LOC~~ SOLN
90.0000 mg | Freq: Two times a day (BID) | SUBCUTANEOUS | Status: DC
  Administered 2011-02-22: 90 mg via SUBCUTANEOUS
  Administered 2011-02-22: 12:00:00 via SUBCUTANEOUS
  Filled 2011-02-22 (×7): qty 1

## 2011-02-22 MED ORDER — PANTOPRAZOLE SODIUM 40 MG PO TBEC
40.0000 mg | DELAYED_RELEASE_TABLET | Freq: Two times a day (BID) | ORAL | Status: DC
Start: 2011-02-22 — End: 2011-02-25
  Administered 2011-02-22 – 2011-02-25 (×6): 40 mg via ORAL
  Filled 2011-02-22 (×6): qty 1

## 2011-02-22 MED ORDER — ENALAPRIL MALEATE 20 MG PO TABS
20.0000 mg | ORAL_TABLET | Freq: Every day | ORAL | Status: DC
  Administered 2011-02-22 – 2011-02-25 (×4): 20 mg via ORAL
  Filled 2011-02-22 (×5): qty 1

## 2011-02-22 MED ORDER — ACETAMINOPHEN 325 MG PO TABS: 650.0000 mg | ORAL_TABLET | ORAL | Status: DC | PRN

## 2011-02-22 MED ORDER — IOHEXOL 350 MG/ML SOLN
125.0000 mL | Freq: Once | INTRAVENOUS | Status: AC | PRN
  Administered 2011-02-22: 125 mL via INTRAVENOUS

## 2011-02-22 MED ORDER — DILTIAZEM HCL ER COATED BEADS 300 MG PO CP24
300.0000 mg | ORAL_CAPSULE | Freq: Every day | ORAL | Status: DC
  Administered 2011-02-22 – 2011-02-23 (×2): 300 mg via ORAL
  Filled 2011-02-22 (×3): qty 1

## 2011-02-22 MED ORDER — SODIUM CHLORIDE 0.9 % IJ SOLN
3.0000 mL | Freq: Two times a day (BID) | INTRAMUSCULAR | Status: DC
  Administered 2011-02-22: 3 mL via INTRAVENOUS
  Administered 2011-02-23: 10:00:00 via INTRAVENOUS

## 2011-02-22 MED ORDER — ALBUTEROL SULFATE (5 MG/ML) 0.5% IN NEBU
2.5000 mg | INHALATION_SOLUTION | RESPIRATORY_TRACT | Status: DC
  Administered 2011-02-22 – 2011-02-23 (×4): 2.5 mg via RESPIRATORY_TRACT
  Filled 2011-02-22 (×4): qty 0.5

## 2011-02-22 NOTE — ED Notes (Signed)
MD Rothbart with patient.  Pt in no apparent distress.

## 2011-02-22 NOTE — Consult Note (Addendum)
Reason for Consult:Chest Pain Referring Physician: Dr Sabra Heck (ER)  Wayne Meadows is an 76 y.o. male with a history of CAD s/p PCI in 2009. HPI: Patient is a 76 yr old with a history of HTN and CVA in 2009. During that admission for CVA, he had a cardiac catheterization that revealed significant obstructive disease (? Vessel) for which he had PCI. His cardiac history has remained unremarkble since then until about a week ago when he started having recurrent substernal chest pain radiating to his back and jaw. Patient described the pain as a "burning pressure", it was 9/10 at maximal intensity, worsened by physical activity. He denies palpitations, diaphoresis, nausea or vomiting. No orthopnea, PND or leg edema. Vitals were significant for hypertension in the ER otherwise normal, his pulses were normal and bilaterally symmetrical. EKG revealed sinus rhythm with IVCD and widespread ST depression. Chest xray did not reveal any widening of the mediastinum. His presentation was raised the suspicion for aortic dissection and the recommendation was to have that ruled out by CTA prior to treating for possible ACS.  Past Medical History  Diagnosis Date  . Hypertension   . Stroke   . Coronary artery disease     S/p PCI 2009    Past Surgical History  Procedure Date  . Hip relacement   . Brain surgery   . Kidney surgery     History reviewed. No pertinent family history.  Social History:  reports that he has never smoked. He does not have any smokeless tobacco history on file. He reports that he drinks alcohol. He reports that he does not use illicit drugs.  Allergies:  Allergies  Allergen Reactions  . Bee Venom     Medications: I have reviewed the patient's current medications.  Results for orders placed during the hospital encounter of 02/21/11 (from the past 48 hour(s))  CBC     Status: Abnormal   Collection Time   02/21/11 11:35 PM      Component Value Range Comment   WBC 12.7 (*) 4.0  - 10.5 (K/uL)    RBC 4.39  4.22 - 5.81 (MIL/uL)    Hemoglobin 13.6  13.0 - 17.0 (g/dL)    HCT 40.9  39.0 - 52.0 (%)    MCV 93.2  78.0 - 100.0 (fL)    MCH 31.0  26.0 - 34.0 (pg)    MCHC 33.3  30.0 - 36.0 (g/dL)    RDW 12.6  11.5 - 15.5 (%)    Platelets 346  150 - 400 (K/uL)   DIFFERENTIAL     Status: Abnormal   Collection Time   02/21/11 11:35 PM      Component Value Range Comment   Neutrophils Relative 73  43 - 77 (%)    Neutro Abs 9.3 (*) 1.7 - 7.7 (K/uL)    Lymphocytes Relative 16  12 - 46 (%)    Lymphs Abs 2.0  0.7 - 4.0 (K/uL)    Monocytes Relative 8  3 - 12 (%)    Monocytes Absolute 1.1 (*) 0.1 - 1.0 (K/uL)    Eosinophils Relative 2  0 - 5 (%)    Eosinophils Absolute 0.3  0.0 - 0.7 (K/uL)    Basophils Relative 0  0 - 1 (%)    Basophils Absolute 0.1  0.0 - 0.1 (K/uL)   APTT     Status: Abnormal   Collection Time   02/21/11 11:35 PM      Component Value Range Comment  aPTT 41 (*) 24 - 37 (seconds)   PROTIME-INR     Status: Normal   Collection Time   02/21/11 11:35 PM      Component Value Range Comment   Prothrombin Time 14.6  11.6 - 15.2 (seconds)    INR 1.12  0.00 - 1.49    POCT I-STAT TROPONIN I     Status: Normal   Collection Time   02/22/11 12:33 AM      Component Value Range Comment   Troponin i, poc 0.03  0.00 - 0.08 (ng/mL)    Comment 3            POCT I-STAT, CHEM 8     Status: Abnormal   Collection Time   02/22/11 12:35 AM      Component Value Range Comment   Sodium 143  135 - 145 (mEq/L)    Potassium 4.0  3.5 - 5.1 (mEq/L)    Chloride 109  96 - 112 (mEq/L)    BUN 14  6 - 23 (mg/dL)    Creatinine, Ser 1.00  0.50 - 1.35 (mg/dL)    Glucose, Bld 122 (*) 70 - 99 (mg/dL)    Calcium, Ion 1.28  1.12 - 1.32 (mmol/L)    TCO2 24  0 - 100 (mmol/L)    Hemoglobin 14.6  13.0 - 17.0 (g/dL)    HCT 43.0  39.0 - 52.0 (%)     Ct Angio Chest W/cm &/or Wo Cm  02/22/2011  *RADIOLOGY REPORT*  Clinical Data:  76 year old with chest pain.  Evaluate for aortic dissection.   CT ANGIOGRAPHY CHEST, ABDOMEN AND PELVIS  Technique:  Multidetector CT imaging through the chest, abdomen and pelvis was performed using the standard protocol during bolus administration of intravenous contrast.  Multiplanar reconstructed images including MIPs were obtained and reviewed to evaluate the vascular anatomy.  Contrast: 162mL OMNIPAQUE IOHEXOL 350 MG/ML IV SOLN  Comparison:  Chest radiograph 02/21/2011  CTA CHEST  Findings:  No evidence for aortic dissection or intramural hematoma.  Small amount of plaque involving the descending thoracic aorta.  No evidence for a large pulmonary embolism.  There is no significant chest lymphadenopathy.  No significant pericardial or pleural fluid.  Evidence for coronary artery calcifications. The trachea and mainstem bronchi are patent.  There are emphysematous changes in the lungs.  Volume loss or scarring at the lung bases. Focal pleural thickening along the right major fissure on image 32, sequence 11, measures 3 mm.   Review of the MIP images confirms the above findings.  IMPRESSION: No evidence for an aortic dissection and no acute chest findings.  Emphysematous changes.  3 mm nodule along the right major fissure. Based on the evidence of emphysematous disease, the patient may benefit from a 20-month follow-up CT to ensure stability.  CTA ABDOMEN AND PELVIS  Findings:  No evidence for an abdominal aortic aneurysm or dissection.  Incidentally, there is a separate origin of the right hepatic artery off the celiac trunk. There are multiple enhancing structures within the liver.  Largest measures up to 1.4 cm in right hepatic lobe on sequence 5, image 93.  Area of hyperperfusion in the right hepatic lobe with a focal lesion on image 104. There are multiple hyperdense round structures associated with both kidneys.  Most of these structures do not demonstrate significant enhancement on the postcontrast images.  However, there is a 1 cm exophytic round structure along the  anterior left kidney that measures 53 HU on the precontrast images  and 78 HU on the postcontrast images.  Findings raise concern for enhancement of this lesion.  No evidence for free fluid or lymphadenopathy.  No gross abnormality to the adrenal glands, spleen or pancreas.  No gross abnormality to the gallbladder.  The patient has a right total hip replacement and has undergone pedicle screw fixation from L2-S1.  There is a ventral hernia containing loops of bowel but no evidence for obstruction.  There is extensive diverticulosis involving the left colon without acute colonic inflammation.  No gross abnormality to the prostate. Urinary bladder may be slightly herniating into the right inguinal canal.  There is marked degenerative changes at L1-L2.  Review of the MIP images confirms the above findings.  IMPRESSION: No evidence for aortic dissection or aneurysm.  Numerous enhancing small lesions in the liver.  Largest measures up to 1.4 cm.  These findings are indeterminate.  Findings could represent flash filling hemangiomas but a neoplastic process cannot be excluded.  Recommend further characterization of these small liver lesions with an MRI of the abdomen (with and without contrast).  Numerous exophytic round renal lesions.  Majority of these lesions appear to represent low density and hyperdense cysts.  However, there is one lesion in the anterior left kidney that may be enhancing and cannot exclude a neoplastic process.  This lesion could be further evaluated with an MRI of the abdomen and evaluated at the same time as the liver lesions.  These results were called by telephone on 02/22/2011  at  8:30 a.m. to  Dr.  Ashok Cordia, who verbally acknowledged these results.  Original Report Authenticated By: Markus Daft, M.D.   Dg Chest Port 1 View  02/21/2011  *RADIOLOGY REPORT*  Clinical Data: Chest pain and hypertension.  PORTABLE CHEST - 1 VIEW  Comparison: 01/20/2006  Findings: Hyperinflation/COPD. Midline trachea.   Normal heart size and mediastinal contours.  Costophrenic angles are minimally excluded.  Given this factor, no pleural fluid. No pneumothorax. Bibasilar scarring.  IMPRESSION: COPD with bibasilar scarring. No acute superimposed process.  Minimal exclusion of the costophrenic angles bilaterally.  Original Report Authenticated By: Areta Haber, M.D.   Ct Angio Abd/pel W/ And/or W/o  02/22/2011  *RADIOLOGY REPORT*  Clinical Data:  76 year old with chest pain.  Evaluate for aortic dissection.  CT ANGIOGRAPHY CHEST, ABDOMEN AND PELVIS  Technique:  Multidetector CT imaging through the chest, abdomen and pelvis was performed using the standard protocol during bolus administration of intravenous contrast.  Multiplanar reconstructed images including MIPs were obtained and reviewed to evaluate the vascular anatomy.  Contrast: 158mL OMNIPAQUE IOHEXOL 350 MG/ML IV SOLN  Comparison:  Chest radiograph 02/21/2011  CTA CHEST  Findings:  No evidence for aortic dissection or intramural hematoma.  Small amount of plaque involving the descending thoracic aorta.  No evidence for a large pulmonary embolism.  There is no significant chest lymphadenopathy.  No significant pericardial or pleural fluid.  Evidence for coronary artery calcifications. The trachea and mainstem bronchi are patent.  There are emphysematous changes in the lungs.  Volume loss or scarring at the lung bases. Focal pleural thickening along the right major fissure on image 32, sequence 11, measures 3 mm.   Review of the MIP images confirms the above findings.  IMPRESSION: No evidence for an aortic dissection and no acute chest findings.  Emphysematous changes.  3 mm nodule along the right major fissure. Based on the evidence of emphysematous disease, the patient may benefit from a 2-month follow-up CT to ensure stability.  CTA ABDOMEN AND PELVIS  Findings:  No evidence for an abdominal aortic aneurysm or dissection.  Incidentally, there is a separate origin of  the right hepatic artery off the celiac trunk. There are multiple enhancing structures within the liver.  Largest measures up to 1.4 cm in right hepatic lobe on sequence 5, image 93.  Area of hyperperfusion in the right hepatic lobe with a focal lesion on image 104. There are multiple hyperdense round structures associated with both kidneys.  Most of these structures do not demonstrate significant enhancement on the postcontrast images.  However, there is a 1 cm exophytic round structure along the anterior left kidney that measures 53 HU on the precontrast images and 78 HU on the postcontrast images.  Findings raise concern for enhancement of this lesion.  No evidence for free fluid or lymphadenopathy.  No gross abnormality to the adrenal glands, spleen or pancreas.  No gross abnormality to the gallbladder.  The patient has a right total hip replacement and has undergone pedicle screw fixation from L2-S1.  There is a ventral hernia containing loops of bowel but no evidence for obstruction.  There is extensive diverticulosis involving the left colon without acute colonic inflammation.  No gross abnormality to the prostate. Urinary bladder may be slightly herniating into the right inguinal canal.  There is marked degenerative changes at L1-L2.  Review of the MIP images confirms the above findings.  IMPRESSION: No evidence for aortic dissection or aneurysm.  Numerous enhancing small lesions in the liver.  Largest measures up to 1.4 cm.  These findings are indeterminate.  Findings could represent flash filling hemangiomas but a neoplastic process cannot be excluded.  Recommend further characterization of these small liver lesions with an MRI of the abdomen (with and without contrast).  Numerous exophytic round renal lesions.  Majority of these lesions appear to represent low density and hyperdense cysts.  However, there is one lesion in the anterior left kidney that may be enhancing and cannot exclude a neoplastic  process.  This lesion could be further evaluated with an MRI of the abdomen and evaluated at the same time as the liver lesions.  These results were called by telephone on 02/22/2011  at  8:30 a.m. to  Dr.  Ashok Cordia, who verbally acknowledged these results.  Original Report Authenticated By: Markus Daft, M.D.    Review of Systems  Constitutional: Negative.   HENT: Negative.   Eyes: Negative.   Respiratory: Negative.   Gastrointestinal: Negative.   Genitourinary: Negative.   Musculoskeletal: Positive for back pain.  Skin: Negative.   Neurological: Negative.   Endo/Heme/Allergies: Negative.   Psychiatric/Behavioral: Negative.    Blood pressure 137/61, pulse 80, temperature 98.6 F (37 C), temperature source Oral, resp. rate 12, height 6\' 2"  (1.88 m), weight 206 lb (93.441 kg), SpO2 93.00%. Physical Exam  Constitutional: He is oriented to person, place, and time. No distress.  HENT:  Head: Normocephalic.  Mouth/Throat: Oropharynx is clear and moist.  Eyes: Conjunctivae are normal. Pupils are equal, round, and reactive to light. No scleral icterus.  Neck: Normal range of motion. Neck supple. No JVD present.  Cardiovascular: Normal rate, regular rhythm, normal heart sounds and intact distal pulses.  Exam reveals no gallop and no friction rub.   No murmur heard. Respiratory: Effort normal and breath sounds normal. No respiratory distress. He has no wheezes. He has no rales. He exhibits no tenderness.  GI: Soft. Bowel sounds are normal. There is no tenderness.  Genitourinary:  Deferred.  Musculoskeletal: Normal range of motion.  Lymphadenopathy:    He has no cervical adenopathy.  Neurological: He is alert and oriented to person, place, and time. No cranial nerve deficit.  Skin: Skin is warm and dry. No rash noted. He is not diaphoretic. No erythema.  Psychiatric: He has a normal mood and affect.    Assessment/Plan: Substernal chest pain radiating to the back in a patient with CAD  s/p remote PCI.  Given the history of radiation to the back , the possibility of aortic dissection was considered and we recommended ruling this out first. If CTA is normal, will treat patient for ACS. Given the ST depression on EKG and ongoing stuttering chest pain, will possibly give ASA, plavix, BB, ACEI, Statin and heparin drip. Patient will be considered for cardiac catheterization.  Addendum The above plan was communicated to the ER prior to the CTA. However at the time of officially documenting the plan, the CTA had been done and read as negative for aortic dissection. Given this new information, patient will be treated for ACS.  Alric Ran 02/22/2011, 8:42 AM    Cardiology Attending Patient interviewed and examined. Discussed with Alric Ran, MD.  Above note annotated and modified based upon my findings.  Patient presents with worrisome chest discomfort and a history of coronary artery disease plus ST segment depression on EKG this is new when compared to the most recent prior tracing performed in 2007. Symptoms are somewhat atypical in that there is no definite relationship to exertion; patient has a history of hiatal hernia and GERD and was recently admitted to [facility] for abdominal discomfort.  Prior records will be obtained to document the extent of his coronary disease when he underwent PCI. Initial treatment will include aspirin, which he takes regularly do to cerebrovascular disease, IV nitroglycerin and enoxaparin at full dose. Beta blockers will be held due to active wheezing. With EKG changes are demonstrated to be old, symptoms resolved, myocardial infarction rules out and coronary disease was mild, stress testing can be considered. However, he will likely require cardiac catheterization.  Jacqulyn Ducking, MD 02/22/2011, 10:27 AM

## 2011-02-22 NOTE — ED Notes (Signed)
[facility] notified to transport pt to [facility]. Reports approximately 31min to 1hr before transport.

## 2011-02-22 NOTE — ED Notes (Signed)
Inpatient bed obtained for patient.  Beds switched out for patient. Pt reports feeling comfortable in new bed.

## 2011-02-22 NOTE — ED Notes (Signed)
Patient reports pain improved after first nitro to a 7/10.

## 2011-02-22 NOTE — ED Notes (Signed)
MD at bedside. 

## 2011-02-22 NOTE — ED Notes (Signed)
Pt had a brief episode of back and chest pain. Pt pain was not improved with change of position. Pt reported pain to be 9/10 at the worst. Pt nitro drip increased without relief of immediate symptoms. Pt given one nitro SL. Patient reports pain to be down to a 4/10 and continuing to improve. Pt vitals remain stable

## 2011-02-22 NOTE — Progress Notes (Signed)
ANTICOAGULATION CONSULT NOTE - Initial Consult  Pharmacy Consult for Lovenox Indication: chest pain/ACS  Allergies  Allergen Reactions  . Bee Venom     Patient Measurements: Height: 6\' 2"  (188 cm) Weight: 206 lb (93.441 kg) IBW/kg (Calculated) : 82.2   Vital Signs: Temp: 98.6 F (37 C) (12/30 0726) Temp src: Oral (12/30 0726) BP: 137/61 mmHg (12/30 0812) Pulse Rate: 80  (12/30 0812)  Labs:  Basename 02/22/11 0035 02/21/11 2335  HGB 14.6 13.6  HCT 43.0 40.9  PLT -- 346  APTT -- 41*  LABPROT -- 14.6  INR -- 1.12  HEPARINUNFRC -- --  CREATININE 1.00 --  CKTOTAL -- --  CKMB -- --  TROPONINI -- --   Estimated Creatinine Clearance: 75.4 ml/min (by C-G formula based on Cr of 1).  Medical History: Past Medical History  Diagnosis Date  . Hypertension   . CVA (cerebral infarction) 2011    Right cerebral; total obstruction of the right ICA  . Coronary artery disease     S/p PCI 2011  . Tobacco abuse, in remission     Medications:  Scheduled:    . sodium chloride   Intravenous Once  . aspirin  324 mg Oral Once  . aspirin EC  81 mg Oral Daily  . cholecalciferol  2,000 Units Oral Daily  . diltiazem  300 mg Oral Daily  . dipyridamole-aspirin  1 capsule Oral BID  . enalapril  20 mg Oral Daily  . Flaxseed Oil  1,000 mg Oral Daily  . fluticasone  2 puff Inhalation BID  . heparin  4,000 Units Intravenous Once  . omega-3 acid ethyl esters  1 g Oral BID  . rosuvastatin  20 mg Oral Daily    Assessment: Per notes, patient will be treated for ACS,  History of CAD s/p remote PCI, CVA 2009,  Previously on Heparin with 4000 unit bolus at 0200 and infusion rate of 12 untis/kg/hr for about 7 hours Heparin level not obtained Renal function is normal  Goal of Therapy:  Lovenox per renal function   Plan:  Lovenox 90 units SQ Q 12 hours Follow daily renal function and labs.  Reubin Milan 02/22/2011,10:21 AM

## 2011-02-22 NOTE — Progress Notes (Signed)
PHARMACIST - PHYSICIAN ORDER COMMUNICATION  CONCERNING: P&T Medication Policy on Herbal Medications  DESCRIPTION:  This patient's order for:  Flaxseed Oil  has been noted.  This product(s) is classified as an "herbal" or natural product. Due to a lack of definitive safety studies or FDA approval, nonstandard manufacturing practices, plus the potential risk of unknown drug-drug interactions while on inpatient medications, the Pharmacy and Therapeutics Committee does not permit the use of "herbal" or natural products of this type within [facility].  PATIENT IS ALREADY ON LOVAZA.   ACTION TAKEN: The pharmacy department is unable to verify this order at this time and your patient has been informed of this safety policy. Please reevaluate patient's clinical condition at discharge and address if the herbal or natural product(s) should be resumed at that time.

## 2011-02-22 NOTE — ED Notes (Signed)
MD at bedside. Cardiologist at bedside.

## 2011-02-22 NOTE — ED Notes (Signed)
Patient back from CT.

## 2011-02-22 NOTE — ED Provider Notes (Signed)
Pt had been signed out by Dr Sabra Heck that pt was admitted/cardiology seeing. I was informed by nursing staff that [place] [facility] had come to ED, seen pt, consulted, ordered CT angio, and recommended admit to medical service, they will consult.  Recheck pt, no chest discomfort, no increased wob. Pt says has no local pcp, [facility] called to admit. 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  [facility] indicates placed bed request to team 5, tele.   Mirna Mires, MD 02/22/11 [phone]

## 2011-02-22 NOTE — ED Notes (Signed)
Report called to Levada Dy, [place] at [phone]

## 2011-02-22 NOTE — ED Notes (Signed)
Report called to [facility] RN-[facility].

## 2011-02-22 NOTE — ED Notes (Signed)
Pt waiting for [facility] transport to [facility].

## 2011-02-22 NOTE — ED Notes (Signed)
MD Ashok Cordia and Linker made aware of patient episode of chest pain.  Pt now denies any pain and is sitting up eating breakfast.  MD Ashok Cordia said okay to feed patient.

## 2011-02-23 ENCOUNTER — Encounter (HOSPITAL_COMMUNITY)
Admission: EM | Disposition: A | Discharge status: Home / Self Care | Payer: Self-pay | Source: Home / Self Care | Attending: Cardiology

## 2011-02-23 ENCOUNTER — Other Ambulatory Visit: Payer: Self-pay

## 2011-02-23 DIAGNOSIS — R935 Abnormal findings on diagnostic imaging of other abdominal regions, including retroperitoneum: Secondary | ICD-10-CM

## 2011-02-23 DIAGNOSIS — I2510 Atherosclerotic heart disease of native coronary artery without angina pectoris: Secondary | ICD-10-CM

## 2011-02-23 DIAGNOSIS — I498 Other specified cardiac arrhythmias: Secondary | ICD-10-CM

## 2011-02-23 HISTORY — PX: TEMPORARY PACEMAKER INSERTION: SHX5471

## 2011-02-23 HISTORY — PX: LEFT HEART CATHETERIZATION WITH CORONARY ANGIOGRAM: SHX5451

## 2011-02-23 HISTORY — PX: PERCUTANEOUS CORONARY STENT INTERVENTION (PCI-S): SHX5485

## 2011-02-23 LAB — CARDIAC PANEL(CRET KIN+CKTOT+MB+TROPI)
CK, MB: 2.4 ng/mL (ref 0.3–4.0)
CK, MB: 2.6 ng/mL (ref 0.3–4.0)
Relative Index: 5 — ABNORMAL HIGH (ref 0.0–2.5)
Relative Index: 5.7 — ABNORMAL HIGH (ref 0.0–2.5)
Relative Index: INVALID (ref 0.0–2.5)
Relative Index: INVALID (ref 0.0–2.5)
Total CK: 121 U/L (ref 7–232)
Total CK: 137 U/L (ref 7–232)
Total CK: 84 U/L (ref 7–232)
Total CK: 95 U/L (ref 7–232)

## 2011-02-23 SURGERY — LEFT HEART CATHETERIZATION WITH CORONARY ANGIOGRAM
Anesthesia: LOCAL

## 2011-02-23 MED ORDER — BIVALIRUDIN 250 MG IV SOLR
INTRAVENOUS | Status: AC
  Filled 2011-02-23: qty 250

## 2011-02-23 MED ORDER — ASPIRIN 81 MG PO CHEW
324.0000 mg | CHEWABLE_TABLET | ORAL | Status: DC
  Filled 2011-02-23: qty 4

## 2011-02-23 MED ORDER — HEPARIN (PORCINE) IN NACL 2-0.9 UNIT/ML-% IJ SOLN
INTRAMUSCULAR | Status: AC
  Filled 2011-02-23: qty 2000

## 2011-02-23 MED ORDER — CLOPIDOGREL BISULFATE 75 MG PO TABS
75.0000 mg | ORAL_TABLET | Freq: Every day | ORAL | Status: DC
  Administered 2011-02-24 – 2011-02-25 (×2): 75 mg via ORAL
  Filled 2011-02-23 (×2): qty 1

## 2011-02-23 MED ORDER — SODIUM CHLORIDE 0.9 % IV SOLN: 1.0000 mL/kg/h | INTRAVENOUS | Status: DC

## 2011-02-23 MED ORDER — MORPHINE SULFATE 2 MG/ML IJ SOLN
1.0000 mg | INTRAMUSCULAR | Status: DC | PRN
  Filled 2011-02-23: qty 1

## 2011-02-23 MED ORDER — VERAPAMIL HCL 2.5 MG/ML IV SOLN
INTRAVENOUS | Status: AC
  Filled 2011-02-23: qty 2

## 2011-02-23 MED ORDER — LIDOCAINE HCL (PF) 1 % IJ SOLN
INTRAMUSCULAR | Status: AC
  Filled 2011-02-23: qty 30

## 2011-02-23 MED ORDER — MORPHINE SULFATE 2 MG/ML IJ SOLN
1.0000 mg | INTRAMUSCULAR | Status: DC | PRN
  Administered 2011-02-23 (×3): 1 mg via INTRAVENOUS
  Filled 2011-02-23 (×2): qty 1

## 2011-02-23 MED ORDER — SODIUM CHLORIDE 0.9 % IV SOLN
INTRAVENOUS | Status: AC
  Administered 2011-02-23: 150 mL via INTRAVENOUS

## 2011-02-23 MED ORDER — MIDAZOLAM HCL 2 MG/2ML IJ SOLN
INTRAMUSCULAR | Status: AC
  Filled 2011-02-23: qty 2

## 2011-02-23 MED ORDER — DIAZEPAM 5 MG PO TABS: 5.0000 mg | ORAL_TABLET | ORAL | Status: DC

## 2011-02-23 MED ORDER — SODIUM CHLORIDE 0.9 % IV SOLN
INTRAVENOUS | Status: DC
  Administered 2011-02-23 – 2011-02-24 (×3): 10 mL/h via INTRAVENOUS

## 2011-02-23 MED ORDER — BIOTENE DRY MOUTH MT LIQD
15.0000 mL | Freq: Two times a day (BID) | OROMUCOSAL | Status: DC
  Administered 2011-02-23: 15 mL via OROMUCOSAL

## 2011-02-23 MED ORDER — FENTANYL CITRATE 0.05 MG/ML IJ SOLN
INTRAMUSCULAR | Status: AC
  Filled 2011-02-23: qty 2

## 2011-02-23 MED ORDER — NITROGLYCERIN 0.2 MG/ML ON CALL CATH LAB
INTRAVENOUS | Status: AC
  Filled 2011-02-23: qty 1

## 2011-02-23 MED ORDER — HEPARIN SODIUM (PORCINE) 5000 UNIT/ML IJ SOLN
5000.0000 [IU] | Freq: Three times a day (TID) | INTRAMUSCULAR | Status: DC
  Administered 2011-02-23: 5000 [IU] via SUBCUTANEOUS
  Filled 2011-02-23 (×5): qty 1

## 2011-02-23 MED ORDER — ASPIRIN 81 MG PO CHEW
81.0000 mg | CHEWABLE_TABLET | Freq: Every day | ORAL | Status: DC
  Administered 2011-02-24 – 2011-02-25 (×2): 81 mg via ORAL
  Filled 2011-02-23 (×2): qty 1

## 2011-02-23 MED ORDER — ONDANSETRON HCL 4 MG/2ML IJ SOLN
INTRAMUSCULAR | Status: AC
  Filled 2011-02-23: qty 2

## 2011-02-23 MED ORDER — IPRATROPIUM-ALBUTEROL 18-103 MCG/ACT IN AERO
2.0000 | INHALATION_SPRAY | Freq: Four times a day (QID) | RESPIRATORY_TRACT | Status: DC
  Administered 2011-02-23 – 2011-02-25 (×8): 2 via RESPIRATORY_TRACT
  Filled 2011-02-23: qty 14.7

## 2011-02-23 NOTE — Progress Notes (Signed)
[place] Progress Note Patient Name: Wayne Meadows DOB: 17-Oct-1934 MRN: [id number]  Date of Service  02/23/2011   HPI/Events of Note   dvt proph needed  eICU Interventions  See orders for proph   Intervention Category Intermediate Interventions: Best-practice therapies (e.g. DVT, beta blocker, etc.)  Asencion Noble 02/23/2011, 6:29 PM

## 2011-02-23 NOTE — Progress Notes (Signed)
CRITICAL VALUE ALERT  Critical value received:  ckmb 6.1, troponin 2.11  Date of notification:  02/23/2011    Time of notification:  1555  Critical value read back:yes  Nurse who received alert:  Carol Ada, RN  MD notified (1st page):  Dr Lia Foyer  Time of first page:    MD notified (2nd page):  Time of second page:1630  Responding MD:  Dr Lia Foyer   Time MD responded:

## 2011-02-23 NOTE — Progress Notes (Signed)
Subjective: Chest pain last night, better with increase in NTG GTT and morphine. No dyspnea at rest. Some left arm pain as well.   Objective: Temp:  [98 F (36.7 C)-99.5 F (37.5 C)] 98 F (36.7 C) (12/31 0800) Pulse Rate:  [76-94] 81  (12/31 0800) Resp:  [8-18] 12  (12/31 0600) BP: (103-177)/(40-82) 123/55 mmHg (12/31 0800) SpO2:  [93 %-100 %] 100 % (12/31 0800) Weight:  [197 lb 12 oz (89.7 kg)] 197 lb 12 oz (89.7 kg) (12/30 1805)  I/O last 3 completed shifts: In: 186 [I.V.:186] Out: 1380 [Urine:1380]  Telemetry - Sinus rhythm.  Exam -   General - NAD.  Lungs - Decreased BS but clear, no active wheeze.  Cardiac - RRR, no rub or S3.  Abdomen - Soft, NABS.  Extremities - No pitting.  Testing -   Lab Results  Component Value Date   WBC 12.7* 02/21/2011   HGB 14.6 02/22/2011   HCT 43.0 02/22/2011   MCV 93.2 02/21/2011   PLT 346 02/21/2011    Lab Results  Component Value Date   CREATININE 1.00 02/22/2011   BUN 14 02/22/2011   NA 143 02/22/2011   K 4.0 02/22/2011   CL 109 02/22/2011    Lab Results  Component Value Date   CKTOTAL 84 02/23/2011   CKMB 2.4 02/23/2011   TROPONINI <0.30 02/23/2011    ECG - 12/30 tracing reviewed, inferolateral ST depression.   Current Medications    . ipratropium  0.5 mg Nebulization QID   And  . albuterol  2.5 mg Nebulization Q4H  . cholecalciferol  2,000 Units Oral Daily  . diltiazem  300 mg Oral Daily  . dipyridamole-aspirin  1 capsule Oral BID  . enalapril  20 mg Oral Daily  . enoxaparin (LOVENOX) injection  90 mg Subcutaneous Q12H  . fluticasone  2 puff Inhalation BID  .  morphine injection  2 mg Intravenous Once  . morphine      . nitroGLYCERIN      . omega-3 acid ethyl esters  1 g Oral BID  . pantoprazole  40 mg Oral BID AC  . polyvinyl alcohol  1 drop Both Eyes TID  . rosuvastatin  20 mg Oral q1800  . sodium chloride  3 mL Intravenous Q12H  . DISCONTD: aspirin EC  81 mg Oral Daily  . DISCONTD: Flaxseed  Oil  1,000 mg Oral Daily  . DISCONTD: hydroxypropyl methylcellulose  1 drop Both Eyes TID    Assessment:  1. Chest pain/back pain concerning for unstable angina, stuttering over last week. Cardiac markers are negative at this point. CTA negative for aortic dissection.  2. CAD status prior PCI in [facility] 2009, details not clear. LVEF not certain. Has had prior regular medical followup at the [facility] hospital system in [place].  3. History of stroke 2009, on Aggrenox as outpatient.  4. HTN.  5. Lipid status not clear, on Crestor. FLP pending.  6. Lesions noted in liver and kidney by CT, possible cysts however further evaluation recommended to exclude neoplastic process. Would assess with MRI abdomen and pelvis (with and without contrast) after cardiac issues stabilized.  Plan:  Situation discussed with patient. Proceed with cardiac catheterization today to best understand coronary anatomy and assess for revascularization options. He will eventually also need MRI abdomen and pelvis (with and without contrast) to further evaluate lesions as noted above.  Satira Sark, M.D., F.A.C.C.

## 2011-02-23 NOTE — Brief Op Note (Signed)
See operative note Jenkins Rouge 02/23/2011

## 2011-02-23 NOTE — Progress Notes (Signed)
Rt groin sheath pulled at 1715, manual pressure held for 15 minutes.  Site level 0 no bleeding or hematoma.  Bed rest begins at 1730.  Pt given instructions to call nurse for any sign of bleeding.  Pt states "I can do that".  Carol Ada, RN, td, 7:11 PM

## 2011-02-23 NOTE — Progress Notes (Signed)
Received order post PCI, pt on BR. Came to talk with pt today since CR is closed tomorrow for New Years. Pt sleepy and does not want to talk in-depth. Gave pt stent card, heart healthy diet, ex gl, and CRPII. Pt sts he will read information and is in agreement for referral for CRPII in [place]. Will f/u Wed to walk and ed. If pt is still here.  [place], ACSM

## 2011-02-23 NOTE — Op Note (Signed)
Cardiac Catheterization Procedure Note  Name: Wayne Meadows MRN: [id number]  DOB: 10-22-1934  Procedure: Left Heart Cath, Selective Coronary Angiography, LV angiography, PTCA and stenting of the RCA with 4.0 mm x 64mm non DES stent.  Indication:and Procedural Data:  Please see the details of Dr. Kyla Meadows note. The patient underwent radial cardiac catheterization by Dr. Johnsie Meadows.  The patient had a critical right coronary artery stenosis, and after injection of this vessel he developed profound bradycardia. Additional views of the left coronary artery were obtained by Dr. Johnsie Meadows. Emergency temporary transvenous pacing was then performed by Dr. Johnsie Meadows with placement of a 5 French femoral pacing catheter into the RV apex. Backup pacing was performed and the patient was given both atropine and epinephrine. The heart rate subsequently came down and the patient stabilized. He had evidence of a small diagonal occlusion, but a extremely large right coronary artery with diminished flow, and thrombotic appearing lesion in the mid vessel. As a result, I was called in to perform emergency PCI.  The sheath was upgraded to a 24F sheath The patient was given bivalirudin using a weight-based protocol. A CT was checked and found to be appropriate. A JR 4 guiding catheter was utilized. The lesion was crossed with a pro-water guidewire, and dilatations performed using both a 2.5 mm balloon, and a 3 mm balloon. A 4 mm x 20 mm Veriflex non-drug-eluting stent was then placed across the lesion and deployed at 14 atmospheres.  Post dilatation was then done with a 4.5 mm noncompliant balloon. The final result was excellent. Dr. Johnsie Meadows and I then discussed the case, and I went back in and took additional views of the left coronary artery. There was occlusion of a small diagonal branch, and given the vessel size in the presence of some collateralization we elected not to perform an intervention on this vessel. Ventriculography was then  performed in the RAO view. I used a hand-held echoprobe to assess the LV. Following this, the femoral sheath was sewn into place and a TR band was placed for hemostasis of the right radial artery.  He had mild residual chest pain, and was taken to the holding area in satisfactory clinical condition. The temporary transvenous pacing wire was removed. External pads for  pacing were placed.  There were no major complications.  He was in atrial fib at the completion of the procedure.    PROCEDURAL FINDINGS Hemodynamics: AO 117/58 (83) LV 104/20   Coronary angiography: Coronary dominance: right  Left mainstem: Free of critical disease  Left anterior descending (LAD): 60% stenosis at the septal.  Small diagonal 1 with total occlusion proximally and some collateralization of the D1 from D2.  Caliber of vessel is about 1-1.36mm.    Left circumflex (LCx): Ramus intermedius with 30-40% proximally.  Large distally.  AV circumflex diffusely plaqued with patent stent and 60% beyond stent.  Vessel is smaller in caliber  Right coronary artery (RCA): Large caliber vessel with large AM, PD, and PLA with mild irregularity.  Has 99% mid stenosis with Timi 2 flow.  Following PCI, 0% residual.  20% proximal irregularity.  Left ventriculography: Left ventricular systolic function is normal, LVEF is estimated at 55-65%, there is no significant mitral regurgitation      Final Conclusions:   Subtotal large RCA with emergent PCI Total occlusion of the small diagonal Preserved LV function  Recommendations:   ASA/Plavix, hold Aggrenox.  Determine neuro status.  Check enzymes.   Wayne Meadows 02/23/2011, 1:18 PM

## 2011-02-23 NOTE — Op Note (Addendum)
Cardiac Catheterization Procedure Note  Name: Wayne Meadows MRN: [id number] DOB: Jan 08, 1935  Procedure: Left Heart Cath, Selective Coronary Angiography, LV angiography  Indication: Chest pain History of Stent in [facility] 2009   Procedural Details: The right wrist was prepped, draped, and anesthetized with 1% lidocaine. Using the modified Seldinger technique, a 5 French sheath was introduced into the right radial artery. 3 mg of verapamil was administered through the sheath, weight-based unfractionated heparin was administered intravenously. Standard Judkins catheters were used for selective coronary angiography and left ventriculography. Catheter exchanges were performed over an exchange length guidewire. There were no immediate procedural complications. A TR band was used for radial hemostasis at the completion of the procedure.  The patient was transferred to the post catheterization recovery area for further monitoring.  Procedural Findings: Hemodynamics:  AO 124 59   Coronary angiography: Coronary dominance: right  Left mainstem: normal  Left anterior descending (LAD): 30% proxima 40% distal  D1:  Subtotally occluded with late filling small vessel   Left circumflex (LCx): Patent stent to the OM 60% disease distal to stent in small vessel  Right coronary artery (RCA):  95% mid vessel lesion   After injecting the RCA the patient had severe SSCP with CHF and hypotension.  He was given atopine 1mg  and epinephrine.  A temporary pacer was placed in the RFV With good caputure.  After about 10 minutes with epi BP 12/80 and CHB gone  Impression:  Critical RCA lesion  Dr Lia Foyer proceding with PCI of RCA ?  Subtotal occlusion of D1 likely old with late collateral filling  Jenkins Rouge 02/23/2011, 11:40 AM

## 2011-02-24 DIAGNOSIS — I214 Non-ST elevation (NSTEMI) myocardial infarction: Principal | ICD-10-CM

## 2011-02-24 DIAGNOSIS — I200 Unstable angina: Secondary | ICD-10-CM

## 2011-02-24 DIAGNOSIS — I2510 Atherosclerotic heart disease of native coronary artery without angina pectoris: Secondary | ICD-10-CM

## 2011-02-24 LAB — CARDIAC PANEL(CRET KIN+CKTOT+MB+TROPI)
Relative Index: 5.2 — ABNORMAL HIGH (ref 0.0–2.5)
Relative Index: 5.1 — ABNORMAL HIGH (ref 0.0–2.5)
Total CK: 110 U/L (ref 7–232)

## 2011-02-24 LAB — BASIC METABOLIC PANEL
BUN: 16 mg/dL (ref 6–23)
Chloride: 108 mEq/L (ref 96–112)
GFR calc non Af Amer: 62 mL/min — ABNORMAL LOW (ref >90)
Glucose, Bld: 91 mg/dL (ref 70–99)
Potassium: 3.9 mEq/L (ref 3.5–5.1)

## 2011-02-24 LAB — LIPID PANEL
HDL: 31 mg/dL — ABNORMAL LOW (ref >39)
LDL Cholesterol: 50 mg/dL (ref 0–99)
Total CHOL/HDL Ratio: 3.3 RATIO
Triglycerides: 101 mg/dL (ref <150)
VLDL: 20 mg/dL (ref 0–40)

## 2011-02-24 LAB — CBC
HCT: 36.9 % — ABNORMAL LOW (ref 39.0–52.0)
Hemoglobin: 12.2 g/dL — ABNORMAL LOW (ref 13.0–17.0)
MCHC: 33.1 g/dL (ref 30.0–36.0)

## 2011-02-24 MED ORDER — HEPARIN SODIUM (PORCINE) 5000 UNIT/ML IJ SOLN
5000.0000 [IU] | Freq: Three times a day (TID) | INTRAMUSCULAR | Status: DC
  Administered 2011-02-24 – 2011-02-25 (×3): 5000 [IU] via SUBCUTANEOUS
  Filled 2011-02-24 (×5): qty 1

## 2011-02-24 MED ORDER — METOPROLOL TARTRATE 12.5 MG HALF TABLET
12.5000 mg | ORAL_TABLET | Freq: Two times a day (BID) | ORAL | Status: DC
  Administered 2011-02-24 – 2011-02-25 (×3): 12.5 mg via ORAL
  Filled 2011-02-24 (×4): qty 1

## 2011-02-24 NOTE — Progress Notes (Signed)
Name: RAD SHROFF MRN: [id number] DOB: 07-08-1934  ELECTRONIC ICU PHYSICIAN NOTE  Problem:  DVT prophylaxis absent as pt was to be discharged today per nursing now staying   Intervention:  Restart hep sq per best practices protocol  Christinia Gully 02/24/2011, 3:20 PM

## 2011-02-24 NOTE — Progress Notes (Signed)
I called Dr. Lattie Haw to discuss case, and spoke with the patient.  I have reviewed his chart as well. He describes a prior kidney cancer on the left. There are lesions on both sides, and also a liver lesion as well. MRI has been recommended.  Kidney surgery was done one year ago, and back in 2006.  I called Dr. Purcell Nails.  Rec is for MRI and I inquired, which is noted on results, of screws and MRI, and he implied it would be ok if done recently.   Will order MRI with and without contrast as recommended for tomorrow.  Will give PO fluids.  Try to get old records.    Will also start metoprolol in light of recent MI.  I did stop his diltiazem.    Spoke with patient at length.   Note: groin site where Dr. Johnsie Cancel placed temp pacer looks fine.    [name] 12:35 PM 02/24/2011

## 2011-02-24 NOTE — Consult Note (Signed)
Wayne Meadows  77 y.o.  male  Subjective: No symptoms since PCI.  Some problems with prolonged bleeding from cath sites-now stopped.  Allergy: Bee venom  Objective: Vital signs in last 24 hours: Temp:  [98.2 F (36.8 C)-100.1 F (37.8 C)] 98.2 F (36.8 C) (01/01 0800) Pulse Rate:  [51-98] 87  (01/01 0900) Resp:  [12-20] 18  (01/01 0830) BP: (98-159)/(28-77) 130/69 mmHg (01/01 0900) SpO2:  [90 %-98 %] 93 % (01/01 0900)  197 lb 12 oz (89.7 kg) Body mass index is 25.39 kg/(m^2).  Weight change:  Last BM Date: 02/22/11 (per pt report)  Intake/Output from previous day: 12/31 0701 - 01/01 0700 In: 1020.6 [P.O.:240; I.V.:780.6] Out: 800 [Urine:800]  General- Well developed; no acute distress  Neck- No JVD, no carotid bruits Lungs- exp wheezing and prolonged I:E ratio, markedly improved since admit Cardiovascular- normal PMI; normal S1 and S2 Abdomen- normal bowel sounds; soft and non-tender without masses or organomegaly Skin- Warm, no significant lesions Extremities- Nl distal pulses; no edema; normal right RA pulse;  Modest ecchymosis in right FA region without bruits.  Lab Results: Cardiac Markers:   Basename 02/24/11 0054 02/23/11 1809  TROPONINI 2.67* 3.29*   CBC:   Basename 02/24/11 0530 02/22/11 0035 02/21/11 2335  WBC 11.4* -- 12.7*  HGB 12.2* 14.6 --  HCT 36.9* 43.0 --  PLT 297 -- 346   BMET:  Basename 02/24/11 0530 02/22/11 0035  NA 139 143  K 3.9 4.0  CL 108 109  CO2 21 --  GLUCOSE 91 122*  BUN 16 14  CREATININE 1.13 1.00  CALCIUM 9.3 --   Hepatic Function:  No results found for this basename: PROT,ALBUMIN,AST,ALT,ALKPHOS,BILITOT,BILIDIR,IBILI in the last 72 hours GFR:  Estimated Creatinine Clearance: 66.7 ml/min (by C-G formula based on Cr of 1.13). Lipids:   Basename 02/24/11 0530  CHOL 101  TRIG 101  HDL 31*   IPrincipal Problem:  *Unstable angina Active Problems:  Cerebrovascular disease  Coronary atherosclerosis of native coronary  artery  COPD (chronic obstructive pulmonary disease)  Tobacco abuse, in remission  Abnormal abdominal CT scan   Assessment/Plan: ACS- minimal non-Q MI.  Stable post-BMS stent implantation.  OK for discharge.  Importance on uninterrupted Rx with plavix emphasized.  Resume usual inhaled steroids and bronchodilators.  20 minutes.  LOS: 3 days   Jacqulyn Ducking 02/24/2011, 9:14 AM

## 2011-02-25 ENCOUNTER — Inpatient Hospital Stay (HOSPITAL_COMMUNITY): Payer: Medicare Other

## 2011-02-25 DIAGNOSIS — I214 Non-ST elevation (NSTEMI) myocardial infarction: Secondary | ICD-10-CM

## 2011-02-25 LAB — BASIC METABOLIC PANEL
CO2: 23 mEq/L (ref 19–32)
Chloride: 104 mEq/L (ref 96–112)
Glucose, Bld: 93 mg/dL (ref 70–99)
Potassium: 3.7 mEq/L (ref 3.5–5.1)
Sodium: 137 mEq/L (ref 135–145)

## 2011-02-25 MED ORDER — METOPROLOL TARTRATE 12.5 MG HALF TABLET: 12.5000 mg | ORAL_TABLET | Freq: Two times a day (BID) | ORAL | Status: DC

## 2011-02-25 MED ORDER — NITROGLYCERIN 0.4 MG SL SUBL: 0.4000 mg | SUBLINGUAL_TABLET | SUBLINGUAL | Status: DC | PRN

## 2011-02-25 MED ORDER — CLOPIDOGREL BISULFATE 75 MG PO TABS: 75.0000 mg | ORAL_TABLET | Freq: Every day | ORAL | Status: AC

## 2011-02-25 MED ORDER — GADOBENATE DIMEGLUMINE 529 MG/ML IV SOLN
20.0000 mL | Freq: Once | INTRAVENOUS | Status: AC
  Administered 2011-02-25: 20 mL via INTRAVENOUS

## 2011-02-25 MED FILL — Dextrose Inj 5%: INTRAVENOUS | Qty: 50 | Status: AC

## 2011-02-25 NOTE — Progress Notes (Signed)
CARDIAC REHAB PHASE I   PRE:  Rate/Rhythm: 82 SR    BP: sitting 143/71    SaO2:   MODE:  Ambulation: 350 ft   POST:  Rate/Rhythm: 101 ST    BP: sitting 166/69     SaO2:   Tolerated well, some SOB/fatigue toward end. Pt very pleasant. Ed completed. [place], [place], ACSM

## 2011-02-25 NOTE — Discharge Summary (Signed)
Discharge Summary   Patient ID: Wayne Meadows,  MRN: [id number], DOB/AGE: 1934-08-06 77 y.o.  Admit date: 02/21/2011 Discharge date: 02/25/2011  Discharge Diagnoses Principal Problem:  *Non Q wave myocardial infarction Active Problems:  Cerebrovascular disease  Coronary atherosclerosis of native coronary artery  COPD (chronic obstructive pulmonary disease)  Tobacco abuse, in remission  Abnormal abdominal CT scan   Allergies Allergies  Allergen Reactions  . Bee Venom     Procedures  Cardiac catheterization  PROCEDURAL FINDINGS  Hemodynamics:  AO 117/58 (83)  LV 104/20  Coronary angiography:  Coronary dominance: right  Left mainstem: Free of critical disease  Left anterior descending (LAD): 60% stenosis at the septal. Small diagonal 1 with total occlusion proximally and some collateralization of the D1 from D2. Caliber of vessel is about 1-1.82mm.  Left circumflex (LCx): Ramus intermedius with 30-40% proximally. Large distally. AV circumflex diffusely plaqued with patent stent and 60% beyond stent. Vessel is smaller in caliber  Right coronary artery (RCA): Large caliber vessel with large AM, PD, and PLA with mild irregularity. Has 99% mid stenosis with Timi 2 flow. Following PCI, 0% residual. 20% proximal irregularity.  Left ventriculography: Left ventricular systolic function is normal, LVEF is estimated at 55-65%, there is no significant mitral regurgitation  Final Conclusions:  Subtotal large RCA with emergent PCI  Total occlusion of the small diagonal  Preserved LV function  Recommendations:  ASA/Plavix, hold Aggrenox. Determine neuro status. Check enzymes.   History of Present Illness  [name] is a 77 yo Caucasian male with PMHx significant for CAD (s/p PCI to LCx 2011), HTN, HL who was admitted to [facility] hospital with NSTEMI.   He reported SSCP radiating to mid back between shoulder blades. He described it as intermittent, occurring mostly at night, worse with  exertion. This was alleviated by NTG SL in the past, but failed to alleviate recently. He denied n/v, LE swelling, cough, fever and SOB.   Hospital Course   He presented to [facility] ED with EKG revealing inferolateral ST depressions. CXR revealed COPD with bibasilar scarring, no acute process noted. POC TnI initially negative. He was subsequently admitted with NSTEMI. Started on NTG gtt and morphine.   He underwent scheduled cardiac catheterization revealing the above details including 99% stenosis to RCA, BMS intervention successful. During the procedure, he became markedly bradycardic, given epinephrine and atropine, subsequently paced transvenously with success. PCI was emergent, but successful. He was stable, and asymptomatic in the holding area, found to be in atrial fibrillation immediately post-procedure, which resolved. Recommendations were made to pursue DAPT- ASA/Plavix, and hold Aggrenox.   On return to CCU, he remained in NSR without acute ischemic changes or evidence of bradycardia. He ambulated well in the halls and remained asymptomatic. BP remained borderline high. He would benefit from aggressive BP control (<130/80) considering CAD and CVA history. He will be discharged with the aforementioned medications in addition to Metoprolol which replaced outpatient Diltiazem.   He was found to have abnormal findings on CT including renal and liver cysts. He states he has undergone biopsy/excision of these lesions in the past which were in fact malignant. Repeat MRI abdomen was ordered and performed. At the time of this discharge summary, formal results were pending.  Today he is stable, in good condition and will be discharged home with the aforementioned medication changes and adjustments. He will follow-up in our office in 1-2 weeks and was encouraged to follow-up with his PCP regarding abnormal CT/MRI.   Discharge Vitals:  Blood pressure 139/66, pulse 76, temperature 98.4 F (36.9 C),  temperature source Oral, resp. rate 14, height 6\' 2"  (1.88 m), weight 89.7 kg (197 lb 12 oz), SpO2 93.00%.   Labs: Recent Labs  Basename 02/24/11 0530   WBC 11.4*   HGB 12.2*   HCT 36.9*   MCV 95.1   PLT 297    Lab 02/25/11 0520 02/24/11 0530 02/22/11 0035  NA 137 139 143  K 3.7 3.9 4.0  CL 104 108 109  CO2 23 21 --  BUN 17 16 14   CREATININE 1.06 1.13 1.00  CALCIUM 9.4 9.3 --  PROT -- -- --  BILITOT -- -- --  ALKPHOS -- -- --  ALT -- -- --  AST -- -- --  AMYLASE -- -- --  LIPASE -- -- --  GLUCOSE 93 91 122*    Recent Labs  Basename 02/24/11 1655 02/24/11 0054 02/23/11 1809   CKTOTAL 110 131 137   CKMB 5.6* 6.8* 7.8*   CKMBINDEX -- -- --   TROPONINI 1.30* 2.67* 3.29*    Recent Labs  Basename 02/24/11 0530   CHOL 101   HDL 31*   LDLCALC 50   TRIG 101   CHOLHDL 3.3   LDLDIRECT --    Disposition: stable, in good condition, will be discharged home today Discharge Orders    Future Appointments: Provider: Department: Dept Phone: Center:   03/02/2011 3:30 PM Liliane Shi, [facility]     Follow-up Information    Follow up with Richardson Dopp, PA on 03/02/2011. (At 3:30PM)    Contact information:   [address] [phone]        Discharge Medications:  Current Discharge Medication List    START taking these medications   Details  clopidogrel (PLAVIX) 75 MG tablet Take 1 tablet (75 mg total) by mouth daily with breakfast. Qty: 30 tablet, Refills: 3    metoprolol tartrate (LOPRESSOR) 12.5 mg TABS Take 0.5 tablets (12.5 mg total) by mouth 2 (two) times daily. Qty: 60 tablet, Refills: 60      CONTINUE these medications which have NOT CHANGED   Details  albuterol-ipratropium (COMBIVENT) 18-103 MCG/ACT inhaler Inhale 2 puffs into the lungs every 6 (six) hours as needed.      aspirin EC 81 MG tablet Take 81 mg by mouth daily.      cholecalciferol (VITAMIN D) 400 UNITS  TABS Take 2,000 Units by mouth daily.      enalapril (VASOTEC) 20 MG tablet Take 20 mg by mouth daily.      Flaxseed, Linseed, (FLAXSEED OIL) 1000 MG CAPS Take 1,000 mg by mouth daily.      hydroxypropyl methylcellulose (ISOPTO TEARS) 2.5 % ophthalmic solution 1 drop.      mometasone (ASMANEX) 220 MCG/INH inhaler Inhale 1 puff into the lungs at bedtime.      nitroGLYCERIN (NITROSTAT) 0.4 MG SL tablet Place 0.4 mg under the tongue every 5 (five) minutes as needed.      omega-3 acid ethyl esters (LOVAZA) 1 G capsule Take 1 g by mouth 2 (two) times daily.      simvastatin (ZOCOR) 80 MG tablet Take 80 mg by mouth at bedtime.        STOP taking these medications     diltiazem (CARDIZEM CD) 300 MG 24 hr capsule Comments:  Reason for Stopping:       dipyridamole-aspirin (AGGRENOX) 25-200 MG per 12 hr  capsule Comments:  Reason for Stopping:          Outstanding Labs/Studies: Abdominal MRI results pending  Duration of Discharge Encounter: 40 minutes including physician time.  Patient was made aware of need for follow up and for need to return to the [place] with the results of his MRI.  He acknowledged that his follow up for this is at the [facility] hospital.    Signed, R. Valeria Batman, PA-C 02/25/2011, 3:18 PM  [name] 4:18 AM 03/26/2011

## 2011-02-25 NOTE — Progress Notes (Signed)
Patient Name: Wayne Meadows Date of Encounter: 02/25/2011     Principal Problem:  *Non Q wave myocardial infarction Active Problems:  Cerebrovascular disease  Coronary atherosclerosis of native coronary artery  COPD (chronic obstructive pulmonary disease)  Tobacco abuse, in remission  Abnormal abdominal CT scan    SUBJECTIVE: Pt sitting comfortably in chair. He denies chest pain, SOB, lightheadedness, palpitations, diaphoresis, n/v and abdominal pain. He c/o back pain which is a chronic issue for him.   OBJECTIVE  Filed Vitals:   02/25/11 0930 02/25/11 1129 02/25/11 1149 02/25/11 1150  BP: 166/69 139/66    Pulse:  71 76   Temp:  98.4 F (36.9 C)    TempSrc:  Oral    Resp:      Height:      Weight:      SpO2:  92% 93% 93%    Intake/Output Summary (Last 24 hours) at 02/25/11 1414 Last data filed at 02/25/11 1000  Gross per 24 hour  Intake    950 ml  Output      0 ml  Net    950 ml   Weight change:   PHYSICAL EXAM  General: Well developed, well nourished, NAD Head: Normocephalic, atraumatic, sclera non-icteric, no xanthomas Neck: Supple without bruits or JVD. Lungs:  Resp regular and unlabored, CTAB, without wheezes, rales or rhonchi Heart: RRR no s3, s4, or murmurs. Abdomen: Soft, central obesity, non-tender, non-distended, BS + x 4.  Msk:  Strength and tone appears normal for age. Extremities: No clubbing, cyanosis or edema. DP/PT/Radials 2+ and equal bilaterally. Neuro: Alert and oriented X 3. Moves all extremities spontaneously. Psych: Normal affect.  LABS:  Recent Labs  Basename 02/24/11 0530   WBC 11.4*   HGB 12.2*   HCT 36.9*   MCV 95.1   PLT 297   No results found for this basename: VITAMINB12,FOLATE,FERRITIN,TIBC,IRON,RETICCTPCT in the last 72 hours No results found for this basename: DDIMER:2 in the last 72 hours  Lab 02/25/11 0520 02/24/11 0530 02/22/11 0035  NA 137 139 143  K 3.7 3.9 4.0  CL 104 108 109  CO2 23 21 --  BUN 17 16 14     CREATININE 1.06 1.13 1.00  CALCIUM 9.4 9.3 --  PROT -- -- --  BILITOT -- -- --  ALKPHOS -- -- --  ALT -- -- --  AST -- -- --  AMYLASE -- -- --  LIPASE -- -- --  GLUCOSE 93 91 122*   No results found for this basename: HGBA1C in the last 72 hours Recent Labs  Basename 02/24/11 1655 02/24/11 0054 02/23/11 1809   CKTOTAL 110 131 137   CKMB 5.6* 6.8* 7.8*   CKMBINDEX -- -- --   TROPONINI 1.30* 2.67* 3.29*   No components found with this basename: POCBNP Recent Labs  Basename 02/24/11 0530   CHOL 101   HDL 31*   LDLCALC 50   TRIG 101   CHOLHDL 3.3   LDLDIRECT --   No results found for this basename: TSH,T4TOTAL,FREET3,T3FREE,THYROIDAB in the last 72 hours   TELE: NSR, 60-70 bpm, no significant episodes of bradycardia  ECG: NSR, 74 bpm, old Q waves V1-V3, no ST-T wave changes, resolution of prior inferolateral ST depressions   Radiology/Studies:  Ct Angio Chest W/cm &/or Wo Cm  02/22/2011  *RADIOLOGY REPORT*  Clinical Data:  77 year old with chest pain.  Evaluate for aortic dissection.  CT ANGIOGRAPHY CHEST, ABDOMEN AND PELVIS  Technique:  Multidetector CT imaging through the  chest, abdomen and pelvis was performed using the standard protocol during bolus administration of intravenous contrast.  Multiplanar reconstructed images including MIPs were obtained and reviewed to evaluate the vascular anatomy.  Contrast: 142mL OMNIPAQUE IOHEXOL 350 MG/ML IV SOLN  Comparison:  Chest radiograph 02/21/2011  CTA CHEST  Findings:  No evidence for aortic dissection or intramural hematoma.  Small amount of plaque involving the descending thoracic aorta.  No evidence for a large pulmonary embolism.  There is no significant chest lymphadenopathy.  No significant pericardial or pleural fluid.  Evidence for coronary artery calcifications. The trachea and mainstem bronchi are patent.  There are emphysematous changes in the lungs.  Volume loss or scarring at the lung bases. Focal pleural thickening  along the right major fissure on image 32, sequence 11, measures 3 mm.   Review of the MIP images confirms the above findings.  IMPRESSION: No evidence for an aortic dissection and no acute chest findings.  Emphysematous changes.  3 mm nodule along the right major fissure. Based on the evidence of emphysematous disease, the patient may benefit from a 62-month follow-up CT to ensure stability.  CTA ABDOMEN AND PELVIS  Findings:  No evidence for an abdominal aortic aneurysm or dissection.  Incidentally, there is a separate origin of the right hepatic artery off the celiac trunk. There are multiple enhancing structures within the liver.  Largest measures up to 1.4 cm in right hepatic lobe on sequence 5, image 93.  Area of hyperperfusion in the right hepatic lobe with a focal lesion on image 104. There are multiple hyperdense round structures associated with both kidneys.  Most of these structures do not demonstrate significant enhancement on the postcontrast images.  However, there is a 1 cm exophytic round structure along the anterior left kidney that measures 53 HU on the precontrast images and 78 HU on the postcontrast images.  Findings raise concern for enhancement of this lesion.  No evidence for free fluid or lymphadenopathy.  No gross abnormality to the adrenal glands, spleen or pancreas.  No gross abnormality to the gallbladder.  The patient has a right total hip replacement and has undergone pedicle screw fixation from L2-S1.  There is a ventral hernia containing loops of bowel but no evidence for obstruction.  There is extensive diverticulosis involving the left colon without acute colonic inflammation.  No gross abnormality to the prostate. Urinary bladder may be slightly herniating into the right inguinal canal.  There is marked degenerative changes at L1-L2.  Review of the MIP images confirms the above findings.  IMPRESSION: No evidence for aortic dissection or aneurysm.  Numerous enhancing small lesions  in the liver.  Largest measures up to 1.4 cm.  These findings are indeterminate.  Findings could represent flash filling hemangiomas but a neoplastic process cannot be excluded.  Recommend further characterization of these small liver lesions with an MRI of the abdomen (with and without contrast).  Numerous exophytic round renal lesions.  Majority of these lesions appear to represent low density and hyperdense cysts.  However, there is one lesion in the anterior left kidney that may be enhancing and cannot exclude a neoplastic process.  This lesion could be further evaluated with an MRI of the abdomen and evaluated at the same time as the liver lesions.  These results were called by telephone on 02/22/2011  at  8:30 a.m. to  Dr.  Ashok Cordia, who verbally acknowledged these results.  Original Report Authenticated By: Markus Daft, M.D.   Dg Chest [facility]  1 View  02/21/2011  *RADIOLOGY REPORT*  Clinical Data: Chest pain and hypertension.  PORTABLE CHEST - 1 VIEW  Comparison: 01/20/2006  Findings: Hyperinflation/COPD. Midline trachea.  Normal heart size and mediastinal contours.  Costophrenic angles are minimally excluded.  Given this factor, no pleural fluid. No pneumothorax. Bibasilar scarring.  IMPRESSION: COPD with bibasilar scarring. No acute superimposed process.  Minimal exclusion of the costophrenic angles bilaterally.  Original Report Authenticated By: Areta Haber, M.D.   Ct Angio Abd/pel W/ And/or W/o  02/22/2011  *RADIOLOGY REPORT*  Clinical Data:  77 year old with chest pain.  Evaluate for aortic dissection.  CT ANGIOGRAPHY CHEST, ABDOMEN AND PELVIS  Technique:  Multidetector CT imaging through the chest, abdomen and pelvis was performed using the standard protocol during bolus administration of intravenous contrast.  Multiplanar reconstructed images including MIPs were obtained and reviewed to evaluate the vascular anatomy.  Contrast: 144mL OMNIPAQUE IOHEXOL 350 MG/ML IV SOLN  Comparison:  Chest  radiograph 02/21/2011  CTA CHEST  Findings:  No evidence for aortic dissection or intramural hematoma.  Small amount of plaque involving the descending thoracic aorta.  No evidence for a large pulmonary embolism.  There is no significant chest lymphadenopathy.  No significant pericardial or pleural fluid.  Evidence for coronary artery calcifications. The trachea and mainstem bronchi are patent.  There are emphysematous changes in the lungs.  Volume loss or scarring at the lung bases. Focal pleural thickening along the right major fissure on image 32, sequence 11, measures 3 mm.   Review of the MIP images confirms the above findings.  IMPRESSION: No evidence for an aortic dissection and no acute chest findings.  Emphysematous changes.  3 mm nodule along the right major fissure. Based on the evidence of emphysematous disease, the patient may benefit from a 71-month follow-up CT to ensure stability.  CTA ABDOMEN AND PELVIS  Findings:  No evidence for an abdominal aortic aneurysm or dissection.  Incidentally, there is a separate origin of the right hepatic artery off the celiac trunk. There are multiple enhancing structures within the liver.  Largest measures up to 1.4 cm in right hepatic lobe on sequence 5, image 93.  Area of hyperperfusion in the right hepatic lobe with a focal lesion on image 104. There are multiple hyperdense round structures associated with both kidneys.  Most of these structures do not demonstrate significant enhancement on the postcontrast images.  However, there is a 1 cm exophytic round structure along the anterior left kidney that measures 53 HU on the precontrast images and 78 HU on the postcontrast images.  Findings raise concern for enhancement of this lesion.  No evidence for free fluid or lymphadenopathy.  No gross abnormality to the adrenal glands, spleen or pancreas.  No gross abnormality to the gallbladder.  The patient has a right total hip replacement and has undergone pedicle screw  fixation from L2-S1.  There is a ventral hernia containing loops of bowel but no evidence for obstruction.  There is extensive diverticulosis involving the left colon without acute colonic inflammation.  No gross abnormality to the prostate. Urinary bladder may be slightly herniating into the right inguinal canal.  There is marked degenerative changes at L1-L2.  Review of the MIP images confirms the above findings.  IMPRESSION: No evidence for aortic dissection or aneurysm.  Numerous enhancing small lesions in the liver.  Largest measures up to 1.4 cm.  These findings are indeterminate.  Findings could represent flash filling hemangiomas but a neoplastic process cannot be  excluded.  Recommend further characterization of these small liver lesions with an MRI of the abdomen (with and without contrast).  Numerous exophytic round renal lesions.  Majority of these lesions appear to represent low density and hyperdense cysts.  However, there is one lesion in the anterior left kidney that may be enhancing and cannot exclude a neoplastic process.  This lesion could be further evaluated with an MRI of the abdomen and evaluated at the same time as the liver lesions.  These results were called by telephone on 02/22/2011  at  8:30 a.m. to  Dr.  Ashok Cordia, who verbally acknowledged these results.  Original Report Authenticated By: Markus Daft, M.D.    Current Medications:     . albuterol-ipratropium  2 puff Inhalation QID  . aspirin  81 mg Oral Daily  . cholecalciferol  2,000 Units Oral Daily  . clopidogrel  75 mg Oral Q breakfast  . enalapril  20 mg Oral Daily  . fluticasone  2 puff Inhalation BID  . gadobenate dimeglumine  20 mL Intravenous Once  . heparin subcutaneous  5,000 Units Subcutaneous Q8H  . metoprolol tartrate  12.5 mg Oral BID  . omega-3 acid ethyl esters  1 g Oral BID  . pantoprazole  40 mg Oral BID AC  . polyvinyl alcohol  1 drop Both Eyes TID  . rosuvastatin  20 mg Oral q1800  . DISCONTD:  antiseptic oral rinse  15 mL Mouth Rinse BID    ASSESSMENT AND PLAN:  1. NSTEMI- pt s/p emergent PCI (BMS to mid RCA) on 12/31 during which time he became bradycardic requiring intravenous pacing. CEs trending down, resolution of inferolateral ST depressions on EKG. On DAPT- ASA/Plavix. Pt on Aggrenox outpatient for hx of CVA in 2009, this was held. He is asymptomatic today, ambulating well in the halls.   2. Renal, liver cysts noted on CT- pt reports prior diagnostic work-up of these lesions including biopsy/excision, states they are malignant. MRI of the abdomen completed today. Formal results pending. These findings will need to be followed-up outpatient.   3. HTN- would benefit from aggressive control (<130/80) outpatient considering CAD, CVA history  4. HL  - continue on statin  5. Disposition- pt has recovered well today, no further episodes of bradycardia, he is essentially asymptomatic complaining only of chronic back pain. He has undergone scheduled MRI. I suspect he can be discharged soon. At the least, he is stable for transfer to telemetry. Will discuss further with Dr. Lia Foyer.   Signed, R. Valeria Batman, PA-C  Patient is much better.  He is stable for discharge.  No chest pain, and ambulating.  Exam benign.  Will arrange for follow up in the [place] office next week.  His data from [place] is currently pending as their med records was not open.  He has undergone MRI today, so we will have those results for follow up next week.  He wants to go to [facility] for the [facility], but I am not sure he should be doing this at this time, and told him so--but he seems determined as he has gone for thirty consecutive years.  I told him no driving for more than two weeks.  Importance of getting and taking meds emphasized.      Will defer switching back to Aggrenox for a month.    [name] 3:02 PM 02/25/2011  02/25/2011, 2:14 PM

## 2011-03-02 ENCOUNTER — Ambulatory Visit (INDEPENDENT_AMBULATORY_CARE_PROVIDER_SITE_OTHER): Payer: Medicare Other | Admitting: Physician Assistant

## 2011-03-02 ENCOUNTER — Encounter: Payer: Self-pay | Admitting: Physician Assistant

## 2011-03-02 DIAGNOSIS — J984 Other disorders of lung: Secondary | ICD-10-CM

## 2011-03-02 DIAGNOSIS — I2510 Atherosclerotic heart disease of native coronary artery without angina pectoris: Secondary | ICD-10-CM

## 2011-03-02 DIAGNOSIS — E7849 Other hyperlipidemia: Secondary | ICD-10-CM | POA: Insufficient documentation

## 2011-03-02 DIAGNOSIS — K769 Liver disease, unspecified: Secondary | ICD-10-CM | POA: Insufficient documentation

## 2011-03-02 DIAGNOSIS — R911 Solitary pulmonary nodule: Secondary | ICD-10-CM | POA: Insufficient documentation

## 2011-03-02 DIAGNOSIS — K7689 Other specified diseases of liver: Secondary | ICD-10-CM

## 2011-03-02 DIAGNOSIS — E785 Hyperlipidemia, unspecified: Secondary | ICD-10-CM

## 2011-03-02 NOTE — Progress Notes (Signed)
[address] [place], [place]  [address] Phone: [phone] Fax:  [phone]  Date:  03/02/2011   Name:  Wayne Meadows       DOB:  11-18-1934 MRN:  [id number]  PCP:  Dr. Einar Grad at the [facility] [phone], extension [phone]) Primary Cardiologist:  Dr.  [name]  Primary Electrophysiologist:  None    History of Present Illness: DEMTRIUS Meadows is a 77 y.o. male who presents for post hospital follow up.  He has a history of CAD, COPD, hypertension, dyslipidemia, prior stroke.  He has a prior history of PCI to the obtuse marginal in 2009 at [facility].  He was admitted 12/29-1/2 with NSTEMI.  LHC demonstrated significant RCA stenosis and he developed profound bradycardia with injection of the RCA.  This required treatment with epinephrine and atropine and temporary pacemaker placement.  Dr. Lia Foyer performed LHC/PCI 02/23/11: LAD 60% after the septal perforator, D1 occluded with distal collaterals, proximal RI 30-40%, AV circumflex stent patent with 60% stenosis after the stent, RCA 99%, EF 60-65%.  His RCA was treated with a bare-metal stent.  He was placed on aspirin and Plavix.  His Aggrenox was held.  Apparently, his Aggrenox will be held for one month.    He did have a chest and abdominal CT upon presentation to the emergency room 02/22/11.  He had no evidence of aortic dissection.  He did have evidence of emphysema.  There was a 3 mm nodule noted in the right major fissure.  It was recommended that he have a 12 month follow up chest CT.  There were also several liver lesions with the largest being 1.4 cm and several renal lesions resembling cysts.  Follow up MRI was recommended.  MRI was done 02/25/11 and demonstrated 2 separate 11 millimeter lesions possibly representing hemangiomas or FNH.  Metastasis cannot be excluded.  Follow up is recommended in 3-6 months.  He also had multiple bilateral renal cysts most of which appear to be hemorrhagic.  Hepatic  steatosis and gallstones were also noted.  The patient apparently has a history of biopsy of these lesions in the past.  He notes he was dx with renal carcinoma and underwent simple resection of the lesion in 2011 at [facility].    Labs: Hemoglobin 12.2, potassium 3.7, creatinine 1.06, TC 101, HDL 31, LDL 50, TG 101.  He is doing well.  The patient denies chest pain, shortness of breath, syncope, orthopnea, PND or significant pedal edema.   Past Medical History  Diagnosis Date  . Hypertension   . CVA (cerebral infarction) 2011    Right cerebral; total obstruction of the right ICA  . Coronary artery disease     S/p PCI 2011;  NSTEMI 12/12:  LHC/PCI 02/23/11: LAD 60% after the septal perforator, D1 occluded with distal collaterals, proximal RI 30-40%, AV circumflex stent patent with 60% stenosis after the stent, RCA 99%, EF 60-65%.  His RCA was treated with a bare-metal stent  . Tobacco abuse, in remission   . Renal carcinoma   . COPD (chronic obstructive pulmonary disease)     Current Outpatient Prescriptions  Medication Sig Dispense Refill  . albuterol-ipratropium (COMBIVENT) 18-103 MCG/ACT inhaler Inhale 2 puffs into the lungs every 6 (six) hours as needed.        Marland Kitchen aspirin EC 81 MG tablet Take 81 mg by mouth daily.        . cholecalciferol (VITAMIN D) 400 UNITS TABS Take  2,000 Units by mouth daily.        . clopidogrel (PLAVIX) 75 MG tablet Take 1 tablet (75 mg total) by mouth daily with breakfast.  30 tablet  3  . enalapril (VASOTEC) 20 MG tablet Take 20 mg by mouth daily.        . Flaxseed, Linseed, (FLAXSEED OIL) 1000 MG CAPS Take 1,000 mg by mouth daily.        Marland Kitchen HYDROcodone-acetaminophen (VICODIN) 5-500 MG per tablet Take 1 tablet by mouth every 6 (six) hours as needed.        . hydroxypropyl methylcellulose (ISOPTO TEARS) 2.5 % ophthalmic solution 1 drop.        . metoprolol tartrate (LOPRESSOR) 12.5 mg TABS Take 0.5 tablets (12.5 mg total) by mouth 2 (two)  times daily.  60 tablet  60  . mometasone (ASMANEX) 220 MCG/INH inhaler Inhale 1 puff into the lungs at bedtime.        . nitroGLYCERIN (NITROSTAT) 0.4 MG SL tablet Place 1 tablet (0.4 mg total) under the tongue every 5 (five) minutes as needed for chest pain.  25 tablet  3  . omega-3 acid ethyl esters (LOVAZA) 1 G capsule Take 1 g by mouth 2 (two) times daily.        . simvastatin (ZOCOR) 80 MG tablet Take 80 mg by mouth at bedtime.          Allergies: Allergies  Allergen Reactions  . Bee Venom     History  Substance Use Topics  . Smoking status: Former Smoker    Types: Cigarettes    Quit date: 02/24/2007  . Smokeless tobacco: Not on file  . Alcohol Use: Yes     ROS:  Please see the history of present illness.   All other systems reviewed and negative.   PHYSICAL EXAM: VS:  BP 126/74  Pulse 70  Ht 6\' 2"  (1.88 m)  Wt 202 lb (91.627 kg)  BMI 25.94 kg/m2 Well nourished, well developed, in no acute distress HEENT: normal Neck: no JVD Cardiac:  normal S1, S2; RRR; no murmur Lungs:  Decreased breath sounds bilaterally, no wheezing, rhonchi or rales Abd: soft, nontender, no hepatomegaly Ext: no edema; right radial site without hematoma or bruit Skin: warm and dry Neuro:  CNs 2-12 intact, no focal abnormalities noted  EKG:  Sinus rhythm, heart rate 70, normal axis, interventricular conduction delay, nonspecific ST-T wave changes, no change from prior  ASSESSMENT AND PLAN:

## 2011-03-02 NOTE — Patient Instructions (Signed)
Your physician recommends that you schedule a follow-up appointment in: 3 weeks with Dr Lia Foyer  Your physician recommends that you schedule a follow-up appointment in: 2-3 weeks with Dr Einar Grad at the [facility]

## 2011-03-02 NOTE — Assessment & Plan Note (Signed)
He is on high dose simvastatin.  He has been on this for years and it is managed at the [place].

## 2011-03-02 NOTE — Assessment & Plan Note (Signed)
He will need follow up chest CT in 12 mos.  I will try to touch base with his PCP at the [facility] and get follow up arranged there.

## 2011-03-02 NOTE — Assessment & Plan Note (Signed)
He needs follow up.  Again, I will try to reach his PCP and arrange for follow up at the [place].  He has a h/o renal cancer that was resected.

## 2011-03-02 NOTE — Assessment & Plan Note (Signed)
Doing well.  He is not interested in cardiac rehab.  Continue ASA and Plavix.  Follow up with Dr.  [name] to decide when to stop Plavix and restart Aggrenox.

## 2011-03-03 NOTE — Progress Notes (Signed)
Appt scheduled with Dr Einar Grad on 03/30/11 at 9:30am (first available).  Test results and ov were faxed to Dr Einar Grad also.  Pt was notified of date and time of appt.

## 2011-03-17 ENCOUNTER — Other Ambulatory Visit: Payer: Self-pay

## 2011-03-17 ENCOUNTER — Emergency Department (HOSPITAL_COMMUNITY): Payer: Medicare Other

## 2011-03-17 ENCOUNTER — Encounter (HOSPITAL_COMMUNITY): Payer: Self-pay | Admitting: Emergency Medicine

## 2011-03-17 ENCOUNTER — Inpatient Hospital Stay (HOSPITAL_COMMUNITY)
Admission: EM | Admit: 2011-03-17 | Discharge: 2011-03-19 | DRG: 287 | Disposition: A | Discharge status: Home / Self Care | Payer: Medicare Other | Attending: Cardiology | Admitting: Cardiology

## 2011-03-17 DIAGNOSIS — I2510 Atherosclerotic heart disease of native coronary artery without angina pectoris: Secondary | ICD-10-CM | POA: Insufficient documentation

## 2011-03-17 DIAGNOSIS — I498 Other specified cardiac arrhythmias: Secondary | ICD-10-CM | POA: Diagnosis present

## 2011-03-17 DIAGNOSIS — Z87891 Personal history of nicotine dependence: Secondary | ICD-10-CM

## 2011-03-17 DIAGNOSIS — I10 Essential (primary) hypertension: Secondary | ICD-10-CM | POA: Diagnosis present

## 2011-03-17 DIAGNOSIS — Z6825 Body mass index (BMI) 25.0-25.9, adult: Secondary | ICD-10-CM

## 2011-03-17 DIAGNOSIS — I6529 Occlusion and stenosis of unspecified carotid artery: Secondary | ICD-10-CM | POA: Diagnosis present

## 2011-03-17 DIAGNOSIS — Z7902 Long term (current) use of antithrombotics/antiplatelets: Secondary | ICD-10-CM

## 2011-03-17 DIAGNOSIS — K219 Gastro-esophageal reflux disease without esophagitis: Secondary | ICD-10-CM | POA: Diagnosis present

## 2011-03-17 DIAGNOSIS — R911 Solitary pulmonary nodule: Secondary | ICD-10-CM | POA: Diagnosis present

## 2011-03-17 DIAGNOSIS — J4489 Other specified chronic obstructive pulmonary disease: Secondary | ICD-10-CM | POA: Diagnosis present

## 2011-03-17 DIAGNOSIS — Z91038 Other insect allergy status: Secondary | ICD-10-CM

## 2011-03-17 DIAGNOSIS — E785 Hyperlipidemia, unspecified: Secondary | ICD-10-CM | POA: Diagnosis present

## 2011-03-17 DIAGNOSIS — J449 Chronic obstructive pulmonary disease, unspecified: Secondary | ICD-10-CM | POA: Diagnosis present

## 2011-03-17 DIAGNOSIS — K449 Diaphragmatic hernia without obstruction or gangrene: Secondary | ICD-10-CM | POA: Diagnosis present

## 2011-03-17 DIAGNOSIS — Z79899 Other long term (current) drug therapy: Secondary | ICD-10-CM

## 2011-03-17 DIAGNOSIS — Q618 Other cystic kidney diseases: Secondary | ICD-10-CM

## 2011-03-17 DIAGNOSIS — E7849 Other hyperlipidemia: Secondary | ICD-10-CM | POA: Insufficient documentation

## 2011-03-17 DIAGNOSIS — R0789 Other chest pain: Principal | ICD-10-CM | POA: Diagnosis present

## 2011-03-17 DIAGNOSIS — I252 Old myocardial infarction: Secondary | ICD-10-CM

## 2011-03-17 DIAGNOSIS — Z7982 Long term (current) use of aspirin: Secondary | ICD-10-CM

## 2011-03-17 DIAGNOSIS — Z96649 Presence of unspecified artificial hip joint: Secondary | ICD-10-CM

## 2011-03-17 DIAGNOSIS — R079 Chest pain, unspecified: Secondary | ICD-10-CM

## 2011-03-17 DIAGNOSIS — Z9861 Coronary angioplasty status: Secondary | ICD-10-CM

## 2011-03-17 DIAGNOSIS — Z8553 Personal history of malignant neoplasm of renal pelvis: Secondary | ICD-10-CM

## 2011-03-17 DIAGNOSIS — Z8673 Personal history of transient ischemic attack (TIA), and cerebral infarction without residual deficits: Secondary | ICD-10-CM

## 2011-03-17 LAB — CBC
HCT: 42.7 % (ref 39.0–52.0)
Hemoglobin: 14.2 g/dL (ref 13.0–17.0)
MCH: 31.1 pg (ref 26.0–34.0)
MCV: 93.4 fL (ref 78.0–100.0)
Platelets: 315 10*3/uL (ref 150–400)
RBC: 4.57 MIL/uL (ref 4.22–5.81)
WBC: 9.2 10*3/uL (ref 4.0–10.5)

## 2011-03-17 LAB — POCT I-STAT, CHEM 8
BUN: 12 mg/dL (ref 6–23)
Calcium, Ion: 1.28 mmol/L (ref 1.12–1.32)
HCT: 45 % (ref 39.0–52.0)
TCO2: 26 mmol/L (ref 0–100)

## 2011-03-17 LAB — BASIC METABOLIC PANEL
BUN: 12 mg/dL (ref 6–23)
CO2: 26 mEq/L (ref 19–32)
Calcium: 9.8 mg/dL (ref 8.4–10.5)
Chloride: 100 mEq/L (ref 96–112)
Creatinine, Ser: 1.2 mg/dL (ref 0.50–1.35)
Glucose, Bld: 98 mg/dL (ref 70–99)

## 2011-03-17 LAB — POCT I-STAT TROPONIN I: Troponin i, poc: 0.01 ng/mL (ref 0.00–0.08)

## 2011-03-17 MED ORDER — MORPHINE SULFATE 4 MG/ML IJ SOLN
4.0000 mg | Freq: Once | INTRAMUSCULAR | Status: AC
  Administered 2011-03-17: 4 mg via INTRAVENOUS
  Filled 2011-03-17: qty 1

## 2011-03-17 MED ORDER — ONDANSETRON HCL 4 MG/2ML IJ SOLN
4.0000 mg | Freq: Once | INTRAMUSCULAR | Status: AC
  Administered 2011-03-17: 4 mg via INTRAVENOUS
  Filled 2011-03-17: qty 2

## 2011-03-17 MED ORDER — ASPIRIN 81 MG PO CHEW
162.0000 mg | CHEWABLE_TABLET | Freq: Once | ORAL | Status: AC
  Administered 2011-03-17: 162 mg via ORAL
  Filled 2011-03-17: qty 2

## 2011-03-17 MED ORDER — NITROGLYCERIN IN D5W 200-5 MCG/ML-% IV SOLN
2.0000 ug/min | Freq: Once | INTRAVENOUS | Status: AC
  Administered 2011-03-17: 5 ug/min via INTRAVENOUS
  Filled 2011-03-17: qty 250

## 2011-03-17 NOTE — ED Notes (Signed)
PT. REPORTS SUBSTERNAL CHEST PAIN ONSET THIS AFTERNOON , SLIGHT SOB WITH NO COUGH , NO NAUSEA OR DIAPHORESIS , TOOK 3 NTG SL WITH RELIEF.

## 2011-03-17 NOTE — ED Provider Notes (Signed)
History     CSN: [id number]  Arrival date & time 03/17/11  2116   First MD Initiated Contact with Patient 03/17/11 2144      Chief Complaint  Patient presents with  . Chest Pain    (Consider location/radiation/quality/duration/timing/severity/associated sxs/prior treatment) HPI  Patient who had recent cardiac stent placement at the end of December 2012 who is followed by [facility] cardiology, Dr. Hyman Hopes, presents to the emergency department complaining of chest pain. Patient states that since discharge from the hospital early January he has had no complaints of chest pain and has increased exercising daily. Patient notes that around 3 PM today he had gradual onset substernal to left lower chest pain that throughout the evening gradually increased in severity despite taking 3 nitroglycerin spaced out throughout the evening. Patient states that after taking the third nitroglycerin around 7 PM, the chest pain completely resolved until it recurred once again at 845 and with greater severity. Patient states it was at this point that he drove himself to the ER and took a fourth nitroglycerin on the way with only mild decrease in the pain stating a current 5/10 chest discomfort. Patient's daughter is now sitting at bedside. Patient states "maybe a little short of breath" but denies any diaphoresis, nausea, cough, abdominal pain, fevers, chills, recent illness. Patient states he took his daily baby aspirin and Plavix this morning. Patient has taken nothing else for pain prior to arrival.  Past Medical History  Diagnosis Date  . Hypertension   . CVA (cerebral infarction) 2011    Right cerebral; total obstruction of the right ICA  . Coronary artery disease     S/p PCI 2011;  NSTEMI 12/12:  LHC/PCI 02/23/11: LAD 60% after the septal perforator, D1 occluded with distal collaterals, proximal RI 30-40%, AV circumflex stent patent with 60% stenosis after the stent, RCA 99%, EF 60-65%.  His RCA was treated  with a bare-metal stent  . Tobacco abuse, in remission   . Renal carcinoma   . COPD (chronic obstructive pulmonary disease)     Past Surgical History  Procedure Date  . Hip relacement   . Brain surgery   . Kidney surgery   . Colon surgery     No family history on file.  History  Substance Use Topics  . Smoking status: Former Smoker    Types: Cigarettes    Quit date: 02/24/2007  . Smokeless tobacco: Not on file  . Alcohol Use: Yes      Review of Systems  All other systems reviewed and are negative.    Allergies  Bee venom  Home Medications   Current Outpatient Rx  Name Route Sig Dispense Refill  . IPRATROPIUM-ALBUTEROL 18-103 MCG/ACT IN AERO Inhalation Inhale 2 puffs into the lungs every 6 (six) hours as needed. For shortness of breath    . ASPIRIN EC 81 MG PO TBEC Oral Take 81 mg by mouth daily.      . CHOLECALCIFEROL 400 UNITS PO TABS Oral Take 2,000 Units by mouth daily.      Marland Kitchen CLOPIDOGREL BISULFATE 75 MG PO TABS Oral Take 1 tablet (75 mg total) by mouth daily with breakfast. 30 tablet 3  . ENALAPRIL MALEATE 20 MG PO TABS Oral Take 20 mg by mouth daily.      Marland Kitchen HYDROCODONE-ACETAMINOPHEN 5-500 MG PO TABS Oral Take 1 tablet by mouth every 6 (six) hours as needed. For pain    . HYPROMELLOSE 2.5 % OP SOLN Both Eyes Place 1 drop  into both eyes daily.     Marland Kitchen METOPROLOL TARTRATE 12.5 MG HALF TABLET Oral Take 0.5 tablets (12.5 mg total) by mouth 2 (two) times daily. 60 tablet 60  . MOMETASONE FUROATE 220 MCG/INH IN AEPB Inhalation Inhale 1 puff into the lungs at bedtime.      Marland Kitchen NITROGLYCERIN 0.4 MG SL SUBL Sublingual Place 1 tablet (0.4 mg total) under the tongue every 5 (five) minutes as needed for chest pain. 25 tablet 3  . OMEGA-3-ACID ETHYL ESTERS 1 G PO CAPS Oral Take 1 g by mouth 2 (two) times daily.      Marland Kitchen SIMVASTATIN 80 MG PO TABS Oral Take 80 mg by mouth at bedtime.      Marland Kitchen FLAXSEED OIL 1000 MG PO CAPS Oral Take 1,000 mg by mouth daily.        BP 169/72  Pulse  81  Temp(Src) 97.8 F (36.6 C) (Oral)  Resp 20  SpO2 100%  Physical Exam  Nursing note and vitals reviewed. Constitutional: He is oriented to person, place, and time. He appears well-developed and well-nourished. No distress.  HENT:  Head: Normocephalic and atraumatic.  Eyes: Conjunctivae are normal.  Neck: Normal range of motion. Neck supple.  Cardiovascular: Normal rate, regular rhythm, normal heart sounds and intact distal pulses.  Exam reveals no gallop and no friction rub.   No murmur heard. Pulmonary/Chest: Effort normal and breath sounds normal. No respiratory distress. He has no wheezes. He has no rales. He exhibits no tenderness.  Abdominal: Bowel sounds are normal. He exhibits no distension and no mass. There is no tenderness. There is no rebound and no guarding.  Musculoskeletal: Normal range of motion. He exhibits no edema and no tenderness.  Neurological: He is alert and oriented to person, place, and time.  Skin: Skin is warm and dry. No rash noted. He is not diaphoretic. No erythema.  Psychiatric: He has a normal mood and affect.    ED Course  Procedures (including critical care time)  PO aspirin  Labs Reviewed  BASIC METABOLIC PANEL - Abnormal; Notable for the following:    GFR calc non Af Amer 58 (*)    GFR calc Af Amer 67 (*)    All other components within normal limits  CBC  POCT I-STAT, CHEM 8  POCT I-STAT TROPONIN I  I-STAT TROPONIN I   Dg Chest 2 View  03/17/2011  *RADIOLOGY REPORT*  Clinical Data: Mid chest pain.  CHEST - 2 VIEW  Comparison: Chest radiograph performed 02/21/2011, and CTA of the chest performed 02/22/2011  Findings: The lungs are well-aerated.  There is no evidence of focal opacification, pleural effusion or pneumothorax.  Increased density at the lung bases reflects overlying soft tissues.  A tiny nodular density along the right major fissure on the prior CTA may reflect a tiny lymph node.  The heart is normal in size; the mediastinal  contour is within normal limits.  No acute osseous abnormalities are seen.  IMPRESSION: No acute cardiopulmonary process seen.  Original Report Authenticated By: Santa Lighter, M.D.   11:41 PM Patient is lying comfortably in bed stating that the pain is resolved after IV nitroglycerin and pain medication. He remains on cardiac monitor. I'm waiting on [facility]ology to call me back to discuss the patient.  12:26 AM The fellow for [facility] returned my call and he will come to ER to evaluate patient who remains CP free.  No diagnosis found.    MDM  [facility] to  evaluate patient. VSS. CP resolved in ER.         [place], [place] 03/18/11 [phone]

## 2011-03-17 NOTE — ED Notes (Signed)
Stated about 3 this afternoon he started with chest discomfort "like it usually does"  Took NTG about 6pm, 630pm and again at 830pm   Stated it is a burning pain rates it about 5-6

## 2011-03-18 ENCOUNTER — Encounter (HOSPITAL_COMMUNITY): Payer: Self-pay | Admitting: Cardiovascular Disease

## 2011-03-18 ENCOUNTER — Encounter (HOSPITAL_COMMUNITY)
Admission: EM | Disposition: A | Discharge status: Home / Self Care | Payer: Self-pay | Source: Home / Self Care | Attending: Cardiology

## 2011-03-18 DIAGNOSIS — I200 Unstable angina: Secondary | ICD-10-CM | POA: Insufficient documentation

## 2011-03-18 DIAGNOSIS — I2510 Atherosclerotic heart disease of native coronary artery without angina pectoris: Secondary | ICD-10-CM

## 2011-03-18 HISTORY — PX: LEFT HEART CATHETERIZATION WITH CORONARY ANGIOGRAM: SHX5451

## 2011-03-18 HISTORY — DX: Unstable angina: I20.0

## 2011-03-18 LAB — CBC
HCT: 39 % (ref 39.0–52.0)
Hemoglobin: 13.1 g/dL (ref 13.0–17.0)
MCHC: 33.6 g/dL (ref 30.0–36.0)
Platelets: 279 10*3/uL (ref 150–400)
RBC: 4.34 MIL/uL (ref 4.22–5.81)
RDW: 12 % (ref 11.5–15.5)
RDW: 12.2 % (ref 11.5–15.5)
WBC: 8.8 10*3/uL (ref 4.0–10.5)
WBC: 7.5 10*3/uL (ref 4.0–10.5)

## 2011-03-18 LAB — BASIC METABOLIC PANEL
CO2: 24 mEq/L (ref 19–32)
Chloride: 102 mEq/L (ref 96–112)
Creatinine, Ser: 1.03 mg/dL (ref 0.50–1.35)
GFR calc Af Amer: 81 mL/min — ABNORMAL LOW (ref >90)
Sodium: 136 mEq/L (ref 135–145)

## 2011-03-18 LAB — LIPID PANEL
HDL: 33 mg/dL — ABNORMAL LOW (ref >39)
LDL Cholesterol: 73 mg/dL (ref 0–99)
Total CHOL/HDL Ratio: 4.2 RATIO
VLDL: 33 mg/dL (ref 0–40)

## 2011-03-18 LAB — CARDIAC PANEL(CRET KIN+CKTOT+MB+TROPI)
CK, MB: 2 ng/mL (ref 0.3–4.0)
Relative Index: 2.4 (ref 0.0–2.5)
Relative Index: INVALID (ref 0.0–2.5)
Total CK: 115 U/L (ref 7–232)
Total CK: 98 U/L (ref 7–232)
Total CK: 91 U/L (ref 7–232)
Troponin I: <0.3 ng/mL (ref <0.30)

## 2011-03-18 LAB — CREATININE, SERUM
Creatinine, Ser: 1.13 mg/dL (ref 0.50–1.35)
GFR calc Af Amer: 72 mL/min — ABNORMAL LOW (ref >90)
GFR calc non Af Amer: 62 mL/min — ABNORMAL LOW (ref >90)

## 2011-03-18 LAB — POCT ACTIVATED CLOTTING TIME: Activated Clotting Time: 127 seconds

## 2011-03-18 LAB — PROTIME-INR
INR: 1.08 (ref 0.00–1.49)
Prothrombin Time: 14.2 seconds (ref 11.6–15.2)

## 2011-03-18 SURGERY — LEFT HEART CATHETERIZATION WITH CORONARY ANGIOGRAM
Anesthesia: LOCAL

## 2011-03-18 MED ORDER — ONDANSETRON HCL 4 MG/2ML IJ SOLN: 4.0000 mg | Freq: Four times a day (QID) | INTRAMUSCULAR | Status: DC | PRN

## 2011-03-18 MED ORDER — MIDAZOLAM HCL 2 MG/2ML IJ SOLN
INTRAMUSCULAR | Status: AC
  Filled 2011-03-18: qty 2

## 2011-03-18 MED ORDER — SODIUM CHLORIDE 0.9 % IJ SOLN: 3.0000 mL | Freq: Two times a day (BID) | INTRAMUSCULAR | Status: DC

## 2011-03-18 MED ORDER — NITROGLYCERIN IN D5W 200-5 MCG/ML-% IV SOLN
5.0000 ug/min | INTRAVENOUS | Status: DC
  Administered 2011-03-18: 5 ug/min via INTRAVENOUS
  Filled 2011-03-18: qty 250

## 2011-03-18 MED ORDER — IPRATROPIUM-ALBUTEROL 18-103 MCG/ACT IN AERO
2.0000 | INHALATION_SPRAY | Freq: Four times a day (QID) | RESPIRATORY_TRACT | Status: DC | PRN
  Filled 2011-03-18: qty 14.7

## 2011-03-18 MED ORDER — NITROGLYCERIN 0.4 MG SL SUBL: 0.4000 mg | SUBLINGUAL_TABLET | SUBLINGUAL | Status: DC | PRN

## 2011-03-18 MED ORDER — LIDOCAINE HCL (PF) 1 % IJ SOLN
INTRAMUSCULAR | Status: AC
  Filled 2011-03-18: qty 30

## 2011-03-18 MED ORDER — SODIUM CHLORIDE 0.9 % IJ SOLN: 3.0000 mL | INTRAMUSCULAR | Status: DC | PRN

## 2011-03-18 MED ORDER — HYPROMELLOSE (GONIOSCOPIC) 2.5 % OP SOLN
1.0000 [drp] | Freq: Every day | OPHTHALMIC | Status: DC
  Administered 2011-03-18: 1 [drp] via OPHTHALMIC
  Filled 2011-03-18: qty 15

## 2011-03-18 MED ORDER — HEPARIN (PORCINE) IN NACL 2-0.9 UNIT/ML-% IJ SOLN
INTRAMUSCULAR | Status: AC
  Filled 2011-03-18: qty 2000

## 2011-03-18 MED ORDER — METOPROLOL TARTRATE 12.5 MG HALF TABLET
12.5000 mg | ORAL_TABLET | Freq: Two times a day (BID) | ORAL | Status: DC
  Administered 2011-03-18 – 2011-03-19 (×3): 12.5 mg via ORAL
  Filled 2011-03-18: qty 0.5
  Filled 2011-03-18: qty 1
  Filled 2011-03-18: qty 0.5
  Filled 2011-03-18: qty 1

## 2011-03-18 MED ORDER — PANTOPRAZOLE SODIUM 40 MG PO TBEC
40.0000 mg | DELAYED_RELEASE_TABLET | Freq: Every day | ORAL | Status: DC
  Administered 2011-03-18 – 2011-03-19 (×2): 40 mg via ORAL
  Filled 2011-03-18 (×2): qty 1

## 2011-03-18 MED ORDER — NITROGLYCERIN 0.2 MG/ML ON CALL CATH LAB
INTRAVENOUS | Status: AC
  Filled 2011-03-18: qty 1

## 2011-03-18 MED ORDER — ENALAPRIL MALEATE 20 MG PO TABS
20.0000 mg | ORAL_TABLET | Freq: Every day | ORAL | Status: DC
  Administered 2011-03-18 – 2011-03-19 (×2): 20 mg via ORAL
  Filled 2011-03-18 (×2): qty 1

## 2011-03-18 MED ORDER — ASPIRIN 81 MG PO CHEW: 324.0000 mg | CHEWABLE_TABLET | ORAL | Status: AC

## 2011-03-18 MED ORDER — OMEGA-3-ACID ETHYL ESTERS 1 G PO CAPS
1.0000 g | ORAL_CAPSULE | Freq: Two times a day (BID) | ORAL | Status: DC
  Administered 2011-03-18 – 2011-03-19 (×3): 1 g via ORAL
  Filled 2011-03-18 (×4): qty 1

## 2011-03-18 MED ORDER — ASPIRIN 81 MG PO CHEW: 324.0000 mg | CHEWABLE_TABLET | ORAL | Status: DC

## 2011-03-18 MED ORDER — FLAXSEED OIL 1000 MG PO CAPS: 1000.0000 mg | ORAL_CAPSULE | Freq: Every day | ORAL | Status: DC

## 2011-03-18 MED ORDER — SODIUM CHLORIDE 0.9 % IV SOLN: INTRAVENOUS | Status: AC

## 2011-03-18 MED ORDER — ROSUVASTATIN CALCIUM 20 MG PO TABS
20.0000 mg | ORAL_TABLET | Freq: Every day | ORAL | Status: DC
  Administered 2011-03-18 – 2011-03-19 (×2): 20 mg via ORAL
  Filled 2011-03-18 (×2): qty 1

## 2011-03-18 MED ORDER — ACETAMINOPHEN 325 MG PO TABS: 650.0000 mg | ORAL_TABLET | ORAL | Status: DC | PRN

## 2011-03-18 MED ORDER — FENTANYL CITRATE 0.05 MG/ML IJ SOLN
INTRAMUSCULAR | Status: AC
  Filled 2011-03-18: qty 2

## 2011-03-18 MED ORDER — HEPARIN BOLUS VIA INFUSION
4000.0000 [IU] | Freq: Once | INTRAVENOUS | Status: AC
  Filled 2011-03-18: qty 4000

## 2011-03-18 MED ORDER — VITAMIN D3 25 MCG (1000 UNIT) PO TABS
2000.0000 [IU] | ORAL_TABLET | Freq: Every day | ORAL | Status: DC
  Administered 2011-03-18 – 2011-03-19 (×2): 2000 [IU] via ORAL
  Filled 2011-03-18 (×2): qty 2

## 2011-03-18 MED ORDER — FLUTICASONE PROPIONATE HFA 44 MCG/ACT IN AERO
2.0000 | INHALATION_SPRAY | Freq: Two times a day (BID) | RESPIRATORY_TRACT | Status: DC
  Administered 2011-03-18 – 2011-03-19 (×3): 2 via RESPIRATORY_TRACT
  Filled 2011-03-18: qty 10.6

## 2011-03-18 MED ORDER — ASPIRIN 300 MG RE SUPP
300.0000 mg | RECTAL | Status: AC
  Filled 2011-03-18: qty 1

## 2011-03-18 MED ORDER — HEPARIN SODIUM (PORCINE) 5000 UNIT/ML IJ SOLN
5000.0000 [IU] | Freq: Three times a day (TID) | INTRAMUSCULAR | Status: DC
  Administered 2011-03-18 – 2011-03-19 (×2): 5000 [IU] via SUBCUTANEOUS
  Filled 2011-03-18 (×6): qty 1

## 2011-03-18 MED ORDER — ASPIRIN EC 81 MG PO TBEC
81.0000 mg | DELAYED_RELEASE_TABLET | Freq: Every day | ORAL | Status: DC
  Filled 2011-03-18: qty 1

## 2011-03-18 MED ORDER — SODIUM CHLORIDE 0.9 % IV SOLN: 250.0000 mL | INTRAVENOUS | Status: DC | PRN

## 2011-03-18 MED ORDER — CLOPIDOGREL BISULFATE 75 MG PO TABS
75.0000 mg | ORAL_TABLET | Freq: Every day | ORAL | Status: DC
  Administered 2011-03-18 – 2011-03-19 (×2): 75 mg via ORAL
  Filled 2011-03-18 (×2): qty 1

## 2011-03-18 MED ORDER — HEPARIN SOD (PORCINE) IN D5W 100 UNIT/ML IV SOLN
1300.0000 [IU]/h | INTRAVENOUS | Status: DC
  Administered 2011-03-18: 1300 [IU]/h via INTRAVENOUS
  Administered 2011-03-18: 4000 [IU]/h via INTRAVENOUS
  Filled 2011-03-18: qty 250

## 2011-03-18 MED ORDER — SODIUM CHLORIDE 0.9 % IV SOLN: 1.0000 mL/kg/h | INTRAVENOUS | Status: DC

## 2011-03-18 NOTE — ED Provider Notes (Signed)
Medical screening examination/treatment/procedure(s) were conducted as a shared visit with non-physician practitioner(s) and myself.  I personally evaluated the patient during the encounter   77 year old male with a history of hypertension and hypercholesterolemia and, stroke, history of tobacco abuse as well as COPD who presents at approximately 2 weeks after having a stent placed in his coronaries. He developed some chest pain earlier today and states that the pain has been burning, radiating to his back and feels similar to his pain from his prior heart problems. Currently the symptoms are mild but they're starting to come back despite being on a nitroglycerin drip.  Physical exam:  Patient appears to be in mild discomfort, no peripheral edema, abdomen is soft, lungs are clear heart is regular without murmurs.  Assessment:  EKG shows no acute ischemia, patient is uncomfortable and will have to go up and titrate the nitroglycerin drip. Cardiology made aware and the cardiologist on call will see the patient for admission. Currently critical care is being delivered by way of nitroglycerin drip, repeat evaluations, consultation with specialists, evaluation of prior medical record, labs and patient's history. Patient has been informed of results and plan and disposition.  CRITICAL CARE Performed by: Johnna Acosta   Total critical care time: 35  Critical care time was exclusive of separately billable procedures and treating other patients.  Critical care was necessary to treat or prevent imminent or life-threatening deterioration.  Critical care was time spent personally by me on the following activities: development of treatment plan with patient and/or surrogate as well as nursing, discussions with consultants, evaluation of patient's response to treatment, examination of patient, obtaining history from patient or surrogate, ordering and performing treatments and interventions, ordering and review  of laboratory studies, ordering and review of radiographic studies, pulse oximetry and re-evaluation of patient's condition.   Johnna Acosta, MD 03/18/11 [phone]

## 2011-03-18 NOTE — Progress Notes (Signed)
ANTICOAGULATION CONSULT NOTE - Initial Consult  Pharmacy Consult for heparin Indication: chest pain/ACS  Allergies  Allergen Reactions  . Bee Venom     unknown    Patient Measurements: Height: 6\' 2"  (188 cm) Weight: 202 lb (91.627 kg) (Copied from 03/02/11 OV) IBW/kg (Calculated) : 82.2   Vital Signs: Temp: 97.8 F (36.6 C) (01/23 0343) Temp src: Oral (01/23 0343) BP: 124/56 mmHg (01/23 0343) Pulse Rate: 66  (01/23 0343)  Labs:  Basename 03/17/11 2155 03/17/11 2135  HGB 15.3 14.2  HCT 45.0 42.7  PLT -- 315  APTT -- --  LABPROT -- --  INR -- --  HEPARINUNFRC -- --  CREATININE 1.30 1.20  CKTOTAL -- --  CKMB -- --  TROPONINI -- --   Estimated Creatinine Clearance: 58 ml/min (by C-G formula based on Cr of 1.3).  Medical History: Past Medical History  Diagnosis Date  . Hypertension   . CVA (cerebral infarction) 2011    Right cerebral; total obstruction of the right ICA  . Coronary artery disease     S/p PCI 2011;  NSTEMI 12/12:  LHC/PCI 02/23/11: LAD 60% after the septal perforator, D1 occluded with distal collaterals, proximal RI 30-40%, AV circumflex stent patent with 60% stenosis after the stent, RCA 99%, EF 60-65%.  His RCA was treated with a bare-metal stent  . Tobacco abuse, in remission   . Renal carcinoma   . COPD (chronic obstructive pulmonary disease)     Assessment: 77yo male with recent stent placement c/o CP resolved after third NTG tab, to begin heparin for ACS.  Goal of Therapy:  Heparin level 0.3-0.7 units/ml   Plan:  Will give heparin bolus of 4000 units x1 followed by gtt at 1300 units/hr and monitor heparin levels and CBC.  Rogue Bussing PharmD BCPS 03/18/2011,5:05 AM

## 2011-03-18 NOTE — Progress Notes (Signed)
Pt to have cath done today with PCI, as ordered.  Pt educated on procedure, pt verbalized understanding and familiarity of procedure.  Pt transported to cath lab via stretcher on monitor.  Pt stable and reports less pain then before with increase of nitro gtt.   03/18/2011 11:21 AM Delisa Finck, Matt Holmes

## 2011-03-18 NOTE — Procedures (Signed)
Cardiac Catheterization Procedure Note  Name: Wayne Meadows MRN: [id number] DOB: [birth year]  Procedure: Selective Coronary Angiography  Indication: Chest pain, history of CAD   Procedural details: The right groin was prepped, draped, and anesthetized with 1% lidocaine. Using modified Seldinger technique, a 5 French sheath was introduced into the right femoral artery. Standard Judkins catheters were used for coronary angiography. Catheter exchanges were performed over a guidewire. There were no immediate procedural complications. The patient was transferred to the post catheterization recovery area for further monitoring.  Procedural Findings: Hemodynamics:  AO 125/72   Coronary angiography: Coronary dominance: right  Left mainstem: No significant disease.   Left anterior descending (LAD): 50% proximal LAD stenosis after D1 at the 1st septal.  D1 was a small vessel and was subtotally occluded with collaterals filling the distal vessel.   Left circumflex (LCx): Large ramus with 30% proximal stenosis.  The AV LCx itself was a small vessel.  There was a patent mid-LCx stent.  Just proximal to the stent there was a discrete 30-40% stenosis.    Right coronary artery (RCA): Patent proximal to mid RCA stent.  Luminal irregularities in RCA.   Left ventriculography: Not done (recent cath with normal EF on LV-gram).   Final Conclusions:  Patent RCA stent.  No change to appearance of the left system compared to prior study.  There was a chronically subtotally occluded D1 that was a small vessel with good collateral filling beyond the obstruction. I suspect epigastric pain he is having is noncardiac.   Recommendations: Discussed with Dr. Lia Foyer.  Will hold overnight.  Will start Protonix.   [name] 03/18/2011, 11:59 AM

## 2011-03-18 NOTE — H&P (View-Only) (Signed)
HPI:  Patient has developed some recurrent pain after the NTG was turned off that is nearly identical to what he had previously.  It is in midepigastrium.  See prior notes, but he developed severe brady in the lab after Dr. Johnsie Cancel took an image of his right coronary, which was subtotally occluded.  He was opened emergently.  He has hemorrhagic cysts of the kidneys, and MRI lesions of the liver.  Follow up at the [facility] is scheduled Feb 4.  NTG being restarted.  Will cancel nuclear studies given the identical nature of his symptoms.    Current Facility-Administered Medications  Medication Dose Route Frequency Provider Last Rate Last Dose  . acetaminophen (TYLENOL) tablet 650 mg  650 mg Oral Q4H PRN Johnna Acosta, MD      . albuterol-ipratropium (COMBIVENT) inhaler 2 puff  2 puff Inhalation Q6H PRN Johnna Acosta, MD      . aspirin chewable tablet 162 mg  162 mg Oral Once Eben Burow, PA   162 mg at 03/17/11 2301  . aspirin chewable tablet 324 mg  324 mg Oral NOW Johnna Acosta, MD       Or  . aspirin suppository 300 mg  300 mg Rectal NOW Johnna Acosta, MD      . aspirin EC tablet 81 mg  81 mg Oral Daily Johnna Acosta, MD      . cholecalciferol (VITAMIN D) tablet 2,000 Units  2,000 Units Oral Daily Johnna Acosta, MD      . clopidogrel (PLAVIX) tablet 75 mg  75 mg Oral Q breakfast Johnna Acosta, MD   75 mg at 03/18/11 0741  . enalapril (VASOTEC) tablet 20 mg  20 mg Oral Daily Johnna Acosta, MD      . fluticasone (FLOVENT HFA) 44 MCG/ACT inhaler 2 puff  2 puff Inhalation BID Johnna Acosta, MD      . heparin ADULT infusion 100 units/ml (25000 units/250 ml)  1,300 Units/hr Intravenous Continuous Johnna Acosta, MD 40 mL/hr at 03/18/11 0555 4,000 Units/hr at 03/18/11 0555  . heparin bolus via infusion 4,000 Units  4,000 Units Intravenous Once Johnna Acosta, MD      . hydroxypropyl methylcellulose (ISOPTO TEARS) 2.5 % ophthalmic solution 1 drop  1 drop Both Eyes Daily Johnna Acosta, MD      .  metoprolol tartrate (LOPRESSOR) tablet 12.5 mg  12.5 mg Oral BID Johnna Acosta, MD      . morphine 4 MG/ML injection 4 mg  4 mg Intravenous Once Eben Burow, PA   4 mg at 03/17/11 2304  . nitroGLYCERIN (NITROSTAT) SL tablet 0.4 mg  0.4 mg Sublingual Q5 min PRN Johnna Acosta, MD      . nitroGLYCERIN 0.2 mg/mL in dextrose 5 % infusion  2-200 mcg/min Intravenous Once Eben Burow, PA 1.5 mL/hr at 03/17/11 2309 5 mcg/min at 03/17/11 2309  . omega-3 acid ethyl esters (LOVAZA) capsule 1 g  1 g Oral BID Johnna Acosta, MD      . ondansetron [facility]) injection 4 mg  4 mg Intravenous Once Eben Burow, PA   4 mg at 03/17/11 2307  . ondansetron (ZOFRAN) injection 4 mg  4 mg Intravenous Q6H PRN Johnna Acosta, MD      . rosuvastatin (CRESTOR) tablet 20 mg  20 mg Oral Daily Johnna Acosta, MD      . DISCONTD: Flaxseed Oil CAPS 1,000 mg  1,000 mg Oral Daily Johnna Acosta, MD        Allergies  Allergen Reactions  . Bee Venom     unknown    Past Medical History  Diagnosis Date  . Hypertension   . CVA (cerebral infarction) 2011    Right cerebral; total obstruction of the right ICA  . Coronary artery disease     S/p PCI 2011;  NSTEMI 12/12:  LHC/PCI 02/23/11: LAD 60% after the septal perforator, D1 occluded with distal collaterals, proximal RI 30-40%, AV circumflex stent patent with 60% stenosis after the stent, RCA 99%, EF 60-65%.  His RCA was treated with a bare-metal stent  . Tobacco abuse, in remission   . Renal carcinoma   . COPD (chronic obstructive pulmonary disease)     Past Surgical History  Procedure Date  . Hip relacement   . Brain surgery   . Kidney surgery   . Colon surgery     History reviewed. No pertinent family history.  History   Social History  . Marital Status: Divorced    Spouse Name: N/A    Number of Children: N/A  . Years of Education: N/A   Occupational History  . Not on file.   Social History Main Topics  . Smoking status: Former Smoker    Types:  Cigarettes    Quit date: 02/24/2007  . Smokeless tobacco: Not on file  . Alcohol Use: Yes  . Drug Use: No  . Sexually Active: Not on file   Other Topics Concern  . Not on file   Social History Narrative  . No narrative on file    ROS: Please see the HPI.  All other systems reviewed and negative.  PHYSICAL EXAM:  BP 131/62  Pulse 72  Temp(Src) 97.8 F (36.6 C) (Oral)  Resp 11  Ht 6\' 2"  (1.88 m)  Wt 90.3 kg (199 lb 1.2 oz)  BMI 25.56 kg/m2  SpO2 94%  General: Well developed, well nourished, in no acute distress. Head:  Normocephalic and atraumatic. Neck: no JVD Lungs: Clear to auscultation and percussion. Heart: Normal S1 and S2.  No murmur, rubs or gallops.  Abdomen:  Normal bowel sounds; soft; non tender; no organomegaly Pulses: Pulses normal in all 4 extremities.  Small scar at R radial site with minimal erythema.   Extremities: No clubbing or cyanosis. No edema. Neurologic: Alert and oriented x 3.  EKG: none at present.    ASSESSMENT AND PLAN:  1.  Unstable angina  -  Symptoms are similar to what he had previously.  Best option would be for repeat cardiac catheterization.  I have explained this to the patient, and he is agreeable to proceed.  Will cancel nuclear study.  Lab notified.   2.  Abnormal MRI  --  Needs follow up which is scheduled at the [facility] hospital.   3.  Prior CVA 4.  Cysts  -- hemmorhagic --need to watch.

## 2011-03-18 NOTE — ED Notes (Signed)
Dr Cyndie Chime called regarding patient and admission.  Stated he will be here soon.

## 2011-03-18 NOTE — Progress Notes (Signed)
HPI:  Patient has developed some recurrent pain after the NTG was turned off that is nearly identical to what he had previously.  It is in midepigastrium.  See prior notes, but he developed severe brady in the lab after Dr. Johnsie Cancel took an image of his right coronary, which was subtotally occluded.  He was opened emergently.  He has hemorrhagic cysts of the kidneys, and MRI lesions of the liver.  Follow up at the [facility] is scheduled Feb 4.  NTG being restarted.  Will cancel nuclear studies given the identical nature of his symptoms.    Current Facility-Administered Medications  Medication Dose Route Frequency Provider Last Rate Last Dose  . acetaminophen (TYLENOL) tablet 650 mg  650 mg Oral Q4H PRN Johnna Acosta, MD      . albuterol-ipratropium (COMBIVENT) inhaler 2 puff  2 puff Inhalation Q6H PRN Johnna Acosta, MD      . aspirin chewable tablet 162 mg  162 mg Oral Once Eben Burow, PA   162 mg at 03/17/11 2301  . aspirin chewable tablet 324 mg  324 mg Oral NOW Johnna Acosta, MD       Or  . aspirin suppository 300 mg  300 mg Rectal NOW Johnna Acosta, MD      . aspirin EC tablet 81 mg  81 mg Oral Daily Johnna Acosta, MD      . cholecalciferol (VITAMIN D) tablet 2,000 Units  2,000 Units Oral Daily Johnna Acosta, MD      . clopidogrel (PLAVIX) tablet 75 mg  75 mg Oral Q breakfast Johnna Acosta, MD   75 mg at 03/18/11 0741  . enalapril (VASOTEC) tablet 20 mg  20 mg Oral Daily Johnna Acosta, MD      . fluticasone (FLOVENT HFA) 44 MCG/ACT inhaler 2 puff  2 puff Inhalation BID Johnna Acosta, MD      . heparin ADULT infusion 100 units/ml (25000 units/250 ml)  1,300 Units/hr Intravenous Continuous Johnna Acosta, MD 40 mL/hr at 03/18/11 0555 4,000 Units/hr at 03/18/11 0555  . heparin bolus via infusion 4,000 Units  4,000 Units Intravenous Once Johnna Acosta, MD      . hydroxypropyl methylcellulose (ISOPTO TEARS) 2.5 % ophthalmic solution 1 drop  1 drop Both Eyes Daily Johnna Acosta, MD      .  metoprolol tartrate (LOPRESSOR) tablet 12.5 mg  12.5 mg Oral BID Johnna Acosta, MD      . morphine 4 MG/ML injection 4 mg  4 mg Intravenous Once Eben Burow, PA   4 mg at 03/17/11 2304  . nitroGLYCERIN (NITROSTAT) SL tablet 0.4 mg  0.4 mg Sublingual Q5 min PRN Johnna Acosta, MD      . nitroGLYCERIN 0.2 mg/mL in dextrose 5 % infusion  2-200 mcg/min Intravenous Once Eben Burow, PA 1.5 mL/hr at 03/17/11 2309 5 mcg/min at 03/17/11 2309  . omega-3 acid ethyl esters (LOVAZA) capsule 1 g  1 g Oral BID Johnna Acosta, MD      . ondansetron [facility]) injection 4 mg  4 mg Intravenous Once Eben Burow, PA   4 mg at 03/17/11 2307  . ondansetron (ZOFRAN) injection 4 mg  4 mg Intravenous Q6H PRN Johnna Acosta, MD      . rosuvastatin (CRESTOR) tablet 20 mg  20 mg Oral Daily Johnna Acosta, MD      . DISCONTD: Flaxseed Oil CAPS 1,000 mg  1,000 mg Oral Daily Johnna Acosta, MD        Allergies  Allergen Reactions  . Bee Venom     unknown    Past Medical History  Diagnosis Date  . Hypertension   . CVA (cerebral infarction) 2011    Right cerebral; total obstruction of the right ICA  . Coronary artery disease     S/p PCI 2011;  NSTEMI 12/12:  LHC/PCI 02/23/11: LAD 60% after the septal perforator, D1 occluded with distal collaterals, proximal RI 30-40%, AV circumflex stent patent with 60% stenosis after the stent, RCA 99%, EF 60-65%.  His RCA was treated with a bare-metal stent  . Tobacco abuse, in remission   . Renal carcinoma   . COPD (chronic obstructive pulmonary disease)     Past Surgical History  Procedure Date  . Hip relacement   . Brain surgery   . Kidney surgery   . Colon surgery     History reviewed. No pertinent family history.  History   Social History  . Marital Status: Divorced    Spouse Name: N/A    Number of Children: N/A  . Years of Education: N/A   Occupational History  . Not on file.   Social History Main Topics  . Smoking status: Former Smoker    Types:  Cigarettes    Quit date: 02/24/2007  . Smokeless tobacco: Not on file  . Alcohol Use: Yes  . Drug Use: No  . Sexually Active: Not on file   Other Topics Concern  . Not on file   Social History Narrative  . No narrative on file    ROS: Please see the HPI.  All other systems reviewed and negative.  PHYSICAL EXAM:  BP 131/62  Pulse 72  Temp(Src) 97.8 F (36.6 C) (Oral)  Resp 11  Ht 6\' 2"  (1.88 m)  Wt 90.3 kg (199 lb 1.2 oz)  BMI 25.56 kg/m2  SpO2 94%  General: Well developed, well nourished, in no acute distress. Head:  Normocephalic and atraumatic. Neck: no JVD Lungs: Clear to auscultation and percussion. Heart: Normal S1 and S2.  No murmur, rubs or gallops.  Abdomen:  Normal bowel sounds; soft; non tender; no organomegaly Pulses: Pulses normal in all 4 extremities.  Small scar at R radial site with minimal erythema.   Extremities: No clubbing or cyanosis. No edema. Neurologic: Alert and oriented x 3.  EKG: none at present.    ASSESSMENT AND PLAN:  1.  Unstable angina  -  Symptoms are similar to what he had previously.  Best option would be for repeat cardiac catheterization.  I have explained this to the patient, and he is agreeable to proceed.  Will cancel nuclear study.  Lab notified.   2.  Abnormal MRI  --  Needs follow up which is scheduled at the [facility] hospital.   3.  Prior CVA 4.  Cysts  -- hemmorhagic --need to watch.

## 2011-03-18 NOTE — ED Notes (Signed)
Patient has been painfree

## 2011-03-18 NOTE — H&P (Signed)
Wayne Meadows is an 77 y.o. male with CAD s/p remote and then a recent PCI 3 weeks ago, HTN, hyperlipidemia and prior CVA. Chief Complaint: Chest pain  HPI: Patient with the above stated history presents with gradual onset of chest pain first noticed about 3pm yesterday. Intensity of the pain went from mild to 7/10 over a 30 minute period. Pain was dull in nature, non radiating, not associated with any nausea, vomiting or diaphoresis. He might have had mild shortness of breath. He took 3 nitro pills Q 68mins with resolution of pain after the 3rd. He was pain free for about 4 hrs then pain recurred. This time, he got in his car to come to the hospital and took a fourth nitro in the car on the way here. It took NTG drip to address the pain which is still present but now 2/10. Cardiac history is significant for CAD s/p PCI in [place] in 2009. He presented late December 2012 with an NSTEMI and had PCI with BMS to RCA. His old AV groove stent was patent but had a 60% stenosis post stent. (see report below). Of note PCI was complicated by transient CHB that required a transfemoral temporary pacemaker. Following discharge, he was seen in clinic and appeared to be doing well, making advances with his rehab exercises.  EKG in the ER was unremarkable and initial point of care troponin was negative.   Past Medical History  Diagnosis Date  . Hypertension   . CVA (cerebral infarction) 2011    Right cerebral; total obstruction of the right ICA  . Coronary artery disease     S/p PCI 2011;  NSTEMI 12/12:  LHC/PCI 02/23/11: LAD 60% after the septal perforator, D1 occluded with distal collaterals, proximal RI 30-40%, AV circumflex stent patent with 60% stenosis after the stent, RCA 99%, EF 60-65%.  His RCA was treated with a bare-metal stent  . Tobacco abuse, in remission   . Renal carcinoma   . COPD (chronic obstructive pulmonary disease)     Past Surgical History  Procedure Date  . Hip relacement   .  Brain surgery   . Kidney surgery   . Colon surgery     History reviewed. No pertinent family history. Social History:  reports that he quit smoking about 4 years ago. His smoking use included Cigarettes. He does not have any smokeless tobacco history on file. He reports that he drinks alcohol. He reports that he does not use illicit drugs.  Allergies:  Allergies  Allergen Reactions  . Bee Venom     unknown    Medications Prior to Admission  Medication Dose Route Frequency Provider Last Rate Last Dose  . aspirin chewable tablet 162 mg  162 mg Oral Once [employer], [employer]   162 mg at 03/17/11 2301  . morphine 4 MG/ML injection 4 mg  4 mg Intravenous Once [employer], [place]   4 mg at 03/17/11 2304  . nitroGLYCERIN 0.2 mg/mL in dextrose 5 % infusion  2-200 mcg/min Intravenous Once Eben Burow, PA 1.5 mL/hr at 03/17/11 2309 5 mcg/min at 03/17/11 2309  . ondansetron (ZOFRAN) injection 4 mg  4 mg Intravenous Once Eben Burow, PA   4 mg at 03/17/11 2307   Medications Prior to Admission  Medication Sig Dispense Refill  . albuterol-ipratropium (COMBIVENT) 18-103 MCG/ACT inhaler Inhale 2 puffs into the lungs every 6 (six) hours as needed. For shortness of breath      .  aspirin EC 81 MG tablet Take 81 mg by mouth daily.        . cholecalciferol (VITAMIN D) 400 UNITS TABS Take 2,000 Units by mouth daily.        . clopidogrel (PLAVIX) 75 MG tablet Take 1 tablet (75 mg total) by mouth daily with breakfast.  30 tablet  3  . enalapril (VASOTEC) 20 MG tablet Take 20 mg by mouth daily.        Marland Kitchen HYDROcodone-acetaminophen (VICODIN) 5-500 MG per tablet Take 1 tablet by mouth every 6 (six) hours as needed. For pain      . hydroxypropyl methylcellulose (ISOPTO TEARS) 2.5 % ophthalmic solution Place 1 drop into both eyes daily.       . metoprolol tartrate (LOPRESSOR) 12.5 mg TABS Take 0.5 tablets (12.5 mg total) by mouth 2 (two) times daily.  60 tablet  60  . mometasone (ASMANEX) 220 MCG/INH inhaler  Inhale 1 puff into the lungs at bedtime.        . nitroGLYCERIN (NITROSTAT) 0.4 MG SL tablet Place 1 tablet (0.4 mg total) under the tongue every 5 (five) minutes as needed for chest pain.  25 tablet  3  . omega-3 acid ethyl esters (LOVAZA) 1 G capsule Take 1 g by mouth 2 (two) times daily.        . simvastatin (ZOCOR) 80 MG tablet Take 80 mg by mouth at bedtime.        . Flaxseed, Linseed, (FLAXSEED OIL) 1000 MG CAPS Take 1,000 mg by mouth daily.          Results for orders placed during the hospital encounter of 03/17/11 (from the past 48 hour(s))  CBC     Status: Normal   Collection Time   03/17/11  9:35 PM      Component Value Range Comment   WBC 9.2  4.0 - 10.5 (K/uL)    RBC 4.57  4.22 - 5.81 (MIL/uL)    Hemoglobin 14.2  13.0 - 17.0 (g/dL)    HCT 42.7  39.0 - 52.0 (%)    MCV 93.4  78.0 - 100.0 (fL)    MCH 31.1  26.0 - 34.0 (pg)    MCHC 33.3  30.0 - 36.0 (g/dL)    RDW 12.1  11.5 - 15.5 (%)    Platelets 315  150 - 400 (K/uL)   BASIC METABOLIC PANEL     Status: Abnormal   Collection Time   03/17/11  9:35 PM      Component Value Range Comment   Sodium 135  135 - 145 (mEq/L)    Potassium 4.7  3.5 - 5.1 (mEq/L)    Chloride 100  96 - 112 (mEq/L)    CO2 26  19 - 32 (mEq/L)    Glucose, Bld 98  70 - 99 (mg/dL)    BUN 12  6 - 23 (mg/dL)    Creatinine, Ser 1.20  0.50 - 1.35 (mg/dL)    Calcium 9.8  8.4 - 10.5 (mg/dL)    GFR calc non Af Amer 58 (*) >90 (mL/min)    GFR calc Af Amer 67 (*) >90 (mL/min)   POCT I-STAT, CHEM 8     Status: Normal   Collection Time   03/17/11  9:55 PM      Component Value Range Comment   Sodium 139  135 - 145 (mEq/L)    Potassium 4.7  3.5 - 5.1 (mEq/L)    Chloride 103  96 - 112 (mEq/L)  BUN 12  6 - 23 (mg/dL)    Creatinine, Ser 1.30  0.50 - 1.35 (mg/dL)    Glucose, Bld 94  70 - 99 (mg/dL)    Calcium, Ion 1.28  1.12 - 1.32 (mmol/L)    TCO2 26  0 - 100 (mmol/L)    Hemoglobin 15.3  13.0 - 17.0 (g/dL)    HCT 45.0  39.0 - 52.0 (%)   POCT I-STAT TROPONIN I      Status: Normal   Collection Time   03/17/11 10:49 PM      Component Value Range Comment   Troponin i, poc 0.01  0.00 - 0.08 (ng/mL)    Comment 3             Dg Chest 2 View  03/17/2011  *RADIOLOGY REPORT*  Clinical Data: Mid chest pain.  CHEST - 2 VIEW  Comparison: Chest radiograph performed 02/21/2011, and CTA of the chest performed 02/22/2011  Findings: The lungs are well-aerated.  There is no evidence of focal opacification, pleural effusion or pneumothorax.  Increased density at the lung bases reflects overlying soft tissues.  A tiny nodular density along the right major fissure on the prior CTA may reflect a tiny lymph node.  The heart is normal in size; the mediastinal contour is within normal limits.  No acute osseous abnormalities are seen.  IMPRESSION: No acute cardiopulmonary process seen.  Original Report Authenticated By: Santa Lighter, M.D.    Review of Systems  Constitutional: Negative.   HENT: Negative.   Eyes: Negative.   Respiratory: Positive for shortness of breath. Negative for cough, hemoptysis, sputum production and wheezing.   Gastrointestinal: Negative.   Genitourinary: Negative.   Musculoskeletal: Negative.   Skin: Negative.   Neurological: Negative.   Endo/Heme/Allergies: Negative.   Psychiatric/Behavioral: Negative.     Blood pressure 124/56, pulse 66, temperature 97.8 F (36.6 C), temperature source Oral, resp. rate 18, SpO2 99.00%. Physical Exam  Constitutional: He is oriented to person, place, and time. No distress.       Mild chest pain 2/10.  HENT:  Head: Normocephalic and atraumatic.  Eyes: No scleral icterus.  Neck: No JVD present.  Cardiovascular: Normal rate, regular rhythm, normal heart sounds and intact distal pulses.  Exam reveals no gallop and no friction rub.   No murmur heard. Respiratory: Effort normal and breath sounds normal. No respiratory distress. He has no wheezes. He has no rales. He exhibits no tenderness.  GI: He exhibits  distension. There is no tenderness. There is no rebound.  Musculoskeletal: Normal range of motion. He exhibits no edema and no tenderness.  Neurological: He is alert and oriented to person, place, and time.  Skin: Skin is dry. No rash noted. He is not diaphoretic. No erythema. No pallor.  Psychiatric: He has a normal mood and affect. His behavior is normal. Judgment and thought content normal.    Coronary angiography:  Coronary dominance: right  Left mainstem: Free of critical disease  Left anterior descending (LAD): 60% stenosis at the septal. Small diagonal 1 with total occlusion proximally and some collateralization of the D1 from D2. Caliber of vessel is about 1-1.36mm.  Left circumflex (LCx): Ramus intermedius with 30-40% proximally. Large distally. AV circumflex diffusely plaqued with patent stent and 60% beyond stent. Vessel is smaller in caliber  Right coronary artery (RCA): Large caliber vessel with large AM, PD, and PLA with mild irregularity. Has 99% mid stenosis with [place] 2 flow. Following PCI, 0% residual. 20% proximal irregularity.  Left ventriculography: Left ventricular systolic function is normal, LVEF is estimated at 55-65%, there is no significant mitral regurgitation   EKG SInus, normal axis, IVCD, no significant ST segment depression or elevation.  Assessment/Plan Unstable angina in a patient with CAD 3 weeks post PCI HTN Hyperlipidemia  PLAN Admit to step down C/w Nitro drip C/w with home meds ASA, Plavix, BB, ACEI, statin Add heparin infusion for now given pain is ongoing Serial EKG and cardiac enzymes If pain resolves by morning, will have nuclear stress test If pain persists or troponin becomes positive, will proceed to [facility], [place] 03/18/2011, 4:35 AM

## 2011-03-18 NOTE — Interval H&P Note (Signed)
History and Physical Interval Note:  03/18/2011 11:31 AM  Wayne Meadows  has presented today for surgery, with the diagnosis of chest pain  The various methods of treatment have been discussed with the patient and family. After consideration of risks, benefits and other options for treatment, the patient has consented to  Procedure(s): [place] as a surgical intervention .  The patients' history has been reviewed, patient examined, no change in status, stable for surgery.  I have reviewed the patients' chart and labs.  Questions were answered to the patient's satisfaction.     D[place] [employer]

## 2011-03-18 NOTE — ED Provider Notes (Signed)
  Physical Exam  BP 130/60  Pulse 61  Temp(Src) 97.8 F (36.6 C) (Oral)  Resp 15  SpO2 98%  Physical Exam  ED Course  Procedures  MDM Patient discussed with cardiologist who will see the patient for admission.      Johnna Acosta, MD 03/18/11 0120

## 2011-03-19 ENCOUNTER — Telehealth: Payer: Self-pay | Admitting: Cardiology

## 2011-03-19 ENCOUNTER — Other Ambulatory Visit: Payer: Self-pay

## 2011-03-19 DIAGNOSIS — R079 Chest pain, unspecified: Secondary | ICD-10-CM

## 2011-03-19 DIAGNOSIS — I10 Essential (primary) hypertension: Secondary | ICD-10-CM

## 2011-03-19 LAB — CBC
Hemoglobin: 13.2 g/dL (ref 13.0–17.0)
MCH: 31.6 pg (ref 26.0–34.0)
MCHC: 33.5 g/dL (ref 30.0–36.0)
Platelets: 276 10*3/uL (ref 150–400)

## 2011-03-19 MED ORDER — POLYVINYL ALCOHOL 1.4 % OP SOLN
1.0000 [drp] | Freq: Every day | OPHTHALMIC | Status: DC
  Administered 2011-03-19: 1 [drp] via OPHTHALMIC
  Filled 2011-03-19: qty 15

## 2011-03-19 MED ORDER — PANTOPRAZOLE SODIUM 40 MG PO TBEC: 40.0000 mg | DELAYED_RELEASE_TABLET | Freq: Every day | ORAL | Status: DC

## 2011-03-19 NOTE — Progress Notes (Signed)
Subjective:  Patient is stable.  No further chest pain.  Not short of breath and no evidence of DVT.  Had prior CT of chest with no large emboli.  Needs to ambulate this am, then can likely go home later.    Objective:  Vital Signs in the last 24 hours: Temp:  [97.8 F (36.6 C)-98.3 F (36.8 C)] 97.8 F (36.6 C) (01/24 0752) Pulse Rate:  [65-82] 66  (01/24 0752) Resp:  [18-19] 18  (01/24 0752) BP: (103-143)/(46-72) 128/72 mmHg (01/24 0752) SpO2:  [94 %-97 %] 94 % (01/24 0752) Weight:  [88.497 kg (195 lb 1.6 oz)] 88.497 kg (195 lb 1.6 oz) (01/24 0500)  Intake/Output from previous day: 01/23 0701 - 01/24 0700 In: 143.3 [P.O.:98.3; I.V.:45] Out: 1500 [Urine:1500]   Physical Exam: General: Well developed, well nourished, in no acute distress. Head:  Normocephalic and atraumatic. Lungs: Clear to auscultation and percussion. Heart: Normal S1 and S2.  No murmur, rubs or gallops.  Pulses: Pulses normal in all 4 extremities.  Groin site looks good.   Extremities: No clubbing or cyanosis. No edema. Neurologic: Alert and oriented x 3.    Lab Results:  Basename 03/19/11 0540 03/18/11 1249  WBC 8.3 7.5  HGB 13.2 13.1  PLT 276 279    Basename 03/18/11 1249 03/18/11 0503 03/17/11 2155 03/17/11 2135  NA -- 136 139 --  K -- 3.9 4.7 --  CL -- 102 103 --  CO2 -- 24 -- 26  GLUCOSE -- 92 94 --  BUN -- 11 12 --  CREATININE 1.13 1.03 -- --    Basename 03/18/11 1828 03/18/11 1248  TROPONINI <0.30 <0.30   Hepatic Function Panel No results found for this basename: PROT,ALBUMIN,AST,ALT,ALKPHOS,BILITOT,BILIDIR,IBILI in the last 72 hours  Basename 03/18/11 0502  CHOL 139   No results found for this basename: PROTIME in the last 72 hours  Imaging: Dg Chest 2 View  03/17/2011  *RADIOLOGY REPORT*  Clinical Data: Mid chest pain.  CHEST - 2 VIEW  Comparison: Chest radiograph performed 02/21/2011, and CTA of the chest performed 02/22/2011  Findings: The lungs are well-aerated.  There is no  evidence of focal opacification, pleural effusion or pneumothorax.  Increased density at the lung bases reflects overlying soft tissues.  A tiny nodular density along the right major fissure on the prior CTA may reflect a tiny lymph node.  The heart is normal in size; the mediastinal contour is within normal limits.  No acute osseous abnormalities are seen.  IMPRESSION: No acute cardiopulmonary process seen.  Original Report Authenticated By: Santa Lighter, M.D.    EKG:  NSR.  WNL this am  Cardiac Studies:  See cath results of Dr. Aundra Dubin.  Patent stents.    Assessment/Plan:  Patient Active Hospital Problem List: Unstable angina (03/18/2011)   Assessment: Pain was similar.  Not quite sure of source   Plan: Ambulate today.  I would like to see him on Monday in the office for continued followup.  He has a visit to the [facility] in [place] which provides his primary care.  We will make contact again with them on Monday.    Results reviewed in detail with the patient.    [name]  9:05 AM 03/19/2011       [name], MD, [facility], [place] 03/19/2011, 9:01 AM

## 2011-03-19 NOTE — Discharge Summary (Signed)
Discharge Summary   Patient ID: Wayne Meadows MRN: [id number], DOB/AGE: 1934/12/01 77 y.o.  [facility] [place] Primary Cardiologist: [name] MD Admit date: 03/17/2011 D/C date:     03/19/2011      Primary Discharge Diagnoses:  1. Chest pain, non cardiac, likely etiology GI   - Cardiac enzymes negative x 3  -  s/p Cardiac Cath 03/18/11 revealing a patent RCA stent and a chronically subtotally occluded D1 with good collateral filling.  - Initiated on Protonix.    Secondary Discharge Diagnoses:  1. CAD   - s/p PCI to OM '09 (stent placed in [place])  - NSTEMI 02/23/11 --> LHC/PCI - LAD 60% after the septal perforator, D1 occluded with distal collaterals, proximal RI 30-40%, AV circumflex stent patent with 60% stenosis after the stent, RCA 99%, EF 60-65%, s/p BMS to RCA  - s/p Cardiac Cath 03/18/11 with findings as noted below  2. HTN 3. HLD - LDL 73 on statin 4. CVA '09 - Right cerebral; total obstruction of the right ICA  5. H/o Renal Cell Carcinoma s/p resection '11 and recent H/o renal lesions on CT 01/2011 6. H/o pulmonary nodule on CT 01/2011 with recommendation for f/u Chest CT in 89yr 7. H/o liver lesions on CT 01/2011 with f/u MRI completed on 02/25/11 demonstrating 2 separate 11 millimeter lesions possibly representing hemangiomas or FNH. Metastasis cannot be excluded. Follow up is recommended in 3-6 months.  8. H/o Tobacco Abuse 9. COPD 10. Hiatal Hernia 11. GERD  Allergies Allergen  . Bee Venom     Diagnostic Studies/Procedures:   03/18/11 - Selective Coronary Angiography Hemodynamics:  AO 125/72  Coronary dominance: right  Left mainstem: No significant disease.  Left anterior descending (LAD): 50% proximal LAD stenosis after D1 at the 1st septal. D1 was a small vessel and was subtotally occluded with collaterals filling the distal vessel.  Left circumflex (LCx): Large ramus with 30% proximal stenosis. The AV LCx itself was a small vessel.  There was a patent mid-LCx stent. Just proximal to the stent there was a discrete 30-40% stenosis.  Right coronary artery (RCA): Patent proximal to mid RCA stent. Luminal irregularities in RCA.  Left ventriculography: Not done (recent cath with normal EF on LV-gram).  Final Conclusions: Patent RCA stent. No change to appearance of the left system compared to prior study. There was a chronically subtotally occluded D1 that was a small vessel with good collateral filling beyond the obstruction. I suspect epigastric pain he is having is noncardiac.  Recommendations: Discussed with Dr. Lia Foyer. Will hold overnight. Will start Protonix.   History of Present Illness: 77 y.o. male w/ PMHx significant for CAD s/p BMS to RCA on 02/23/11 who presented to [facility] on 03/18/11 with complaints of chest pain that was initially relieved with SL NTG but returned.   Hospital Course: EKG showed no acute ischemia and initial poc troponin was negative. CXR was without acute cardiopulmonary findings.  ASA, Plavix, BB, ACEI, and statin were continued and heparin and NTG drips were initiated. His chest pain continued despite medical therapy and was nearly identical to his previous angina. He underwent cardiac catheterization on 03/18/11 which revealed a patent RCA stent and a chronically subtotally occluded D1 with good collateral filling. It was thought his epigastric pain was noncardiac in nature and he would best benefit from addition of Protonix. Cardiac enzymes remained negative.  He had no further chest pain and was able to ambulate without shortness of  breath or chest pain on day of discharge. He was seen and examined by Dr. Lia Foyer who felt he was stable for discharge home with plans for follow up as scheduled below.    Discharge Vitals: Blood pressure 119/61, pulse 70, temperature 97.7 F (36.5 C), temperature source Oral, resp. rate 18, height 6\' 2"  (1.88 m), weight 195 lb 1.6 oz (88.497 kg), SpO2  94.00%.  Labs: Lab Results   WBC 8.3 03/19/2011   HGB 13.2 03/19/2011   HCT 39.4 03/19/2011   MCV 94.3 03/19/2011   PLT 276 03/19/2011    Lab 03/18/11 1249 03/18/11 0503  NA -- 136  K -- 3.9  CL -- 102  CO2 -- 24  BUN -- 11  CREATININE 1.13 --  CALCIUM -- 9.6  GLUCOSE -- 92     03/17/2011 22:49  Troponin i, poc 0.01   Basename 03/18/11 1828 03/18/11 1248 03/18/11 0502  CKTOTAL 91 98 115  CKMB 2.0 2.6 2.8  TROPONINI <0.30 <0.30 <0.30   Component Value Date   CHOL 139 03/18/2011   HDL 33* 03/18/2011   LDLCALC 73 03/18/2011   TRIG 163* 03/18/2011    Discharge Medications   Medication List  As of 03/19/2011  3:01 PM   TAKE these medications         albuterol-ipratropium 18-103 MCG/ACT inhaler   Commonly known as: COMBIVENT   Inhale 2 puffs into the lungs every 6 (six) hours as needed. For shortness of breath      aspirin EC 81 MG tablet   Take 81 mg by mouth daily.      cholecalciferol 400 UNITS Tabs   Commonly known as: VITAMIN D   Take 2,000 Units by mouth daily.      clopidogrel 75 MG tablet   Commonly known as: PLAVIX   Take 1 tablet (75 mg total) by mouth daily with breakfast.      enalapril 20 MG tablet   Commonly known as: VASOTEC   Take 20 mg by mouth daily.      Flaxseed Oil 1000 MG Caps   Take 1,000 mg by mouth daily.      HYDROcodone-acetaminophen 5-500 MG per tablet   Commonly known as: VICODIN   Take 1 tablet by mouth every 6 (six) hours as needed. For pain      hydroxypropyl methylcellulose 2.5 % ophthalmic solution   Commonly known as: ISOPTO TEARS   Place 1 drop into both eyes daily.      metoprolol tartrate 12.5 mg Tabs   Commonly known as: LOPRESSOR   Take 0.5 tablets (12.5 mg total) by mouth 2 (two) times daily.      mometasone 220 MCG/INH inhaler   Commonly known as: ASMANEX   Inhale 1 puff into the lungs at bedtime.      nitroGLYCERIN 0.4 MG SL tablet   Commonly known as: NITROSTAT   Place 1 tablet (0.4 mg total) under the tongue  every 5 (five) minutes as needed for chest pain.      omega-3 acid ethyl esters 1 G capsule   Commonly known as: LOVAZA   Take 1 g by mouth 2 (two) times daily.      pantoprazole 40 MG tablet   Commonly known as: PROTONIX   Take 1 tablet (40 mg total) by mouth daily at 12 noon.      simvastatin 80 MG tablet   Commonly known as: ZOCOR   Take 80 mg by mouth at bedtime.  Disposition   Discharge Orders    Future Appointments: Provider: Department: Dept Phone: Center:   03/25/2011 12:00 PM [name], [place] [phone] [id number]     Future Orders Please Complete By Expires   Diet - low sodium heart healthy      Increase activity slowly      Discharge instructions      Comments:   **PLEASE REMEMBER TO BRING ALL OF YOUR MEDICATIONS TO EACH OF YOUR FOLLOW-UP OFFICE VISITS.  * KEEP GROIN SITE CLEAN AND DRY. Call the office for any signs of bleedings, pus, swelling, increased pain, or any other concerns. * NO HEAVY LIFTING (>10lbs) OR SEXUAL ACTIVITY X 6 DAYS. * NO DRIVING X 1-2 DAYS. * NO SOAKING BATHS, HOT TUBS, POOLS, ETC., X 6 DAYS.      Follow-up Information    Follow up with Primary Care Provider at the [facility] in [place]. (As scheduled)       Follow up with [name], MD on 03/25/2011. (12:00)    Contact information:   [facility] [place] [phone]           Outstanding Labs/Studies:  1. Follow up Abdominal MRI for liver and renal lesions in 3-37mos 2. Follow up Chest CT for pulmonary nodules in 33yr   Duration of Discharge Encounter: Greater than 30 minutes including physician and PA time.  Signed, Yun Gutierrez PA-C 03/19/2011, 3:01 PM

## 2011-03-23 ENCOUNTER — Ambulatory Visit (INDEPENDENT_AMBULATORY_CARE_PROVIDER_SITE_OTHER): Payer: Medicare Other | Admitting: Cardiology

## 2011-03-23 ENCOUNTER — Encounter: Payer: Self-pay | Admitting: Cardiology

## 2011-03-23 DIAGNOSIS — J984 Other disorders of lung: Secondary | ICD-10-CM

## 2011-03-23 DIAGNOSIS — R935 Abnormal findings on diagnostic imaging of other abdominal regions, including retroperitoneum: Secondary | ICD-10-CM

## 2011-03-23 DIAGNOSIS — E785 Hyperlipidemia, unspecified: Secondary | ICD-10-CM

## 2011-03-23 DIAGNOSIS — R911 Solitary pulmonary nodule: Secondary | ICD-10-CM

## 2011-03-23 DIAGNOSIS — I2510 Atherosclerotic heart disease of native coronary artery without angina pectoris: Secondary | ICD-10-CM

## 2011-03-23 NOTE — Assessment & Plan Note (Signed)
Small.  Follow up one year suggested.  Patient reminded. Copy of CT given to the patient, and he will provide to the [facility].

## 2011-03-23 NOTE — Progress Notes (Signed)
HPI:  Patient is stable.  He was readmitted to the hospital and underwent repeat cardiac cath.  The study was done by Dr. Aundra Dubin.  Today, I gave the patient a copy of his MRI and also CT scan.  He is scheduled to be seen at the [place] with his primary, and he identifies him as the main provider and follow up to his renal cancer.  I made patient aware of the need for follow up  His repeat cath done by Dr. Aundra Dubin demonstrated the following:  AO 125/72  Coronary angiography:  Coronary dominance: right  Left mainstem: No significant disease.  Left anterior descending (LAD): 50% proximal LAD stenosis after D1 at the 1st septal. D1 was a small vessel and was subtotally occluded with collaterals filling the distal vessel.  Left circumflex (LCx): Large ramus with 30% proximal stenosis. The AV LCx itself was a small vessel. There was a patent mid-LCx stent. Just proximal to the stent there was a discrete 30-40% stenosis.  Right coronary artery (RCA): Patent proximal to mid RCA stent. Luminal irregularities in RCA.  Left ventriculography: Not done (recent cath with normal EF on LV-gram).  Final Conclusions: Patent RCA stent. No change to appearance of the left system compared to prior study. There was a chronically subtotally occluded D1 that was a small vessel with good collateral filling beyond the obstruction. I suspect epigastric pain he is having is noncardiac.  Recommendations: Discussed with Dr. Lia Foyer. Will hold overnight. Will start Protonix.  [name]  03/18/2011, 11:59 AM    Current Outpatient Prescriptions  Medication Sig Dispense Refill  . albuterol-ipratropium (COMBIVENT) 18-103 MCG/ACT inhaler Inhale 2 puffs into the lungs every 6 (six) hours as needed. For shortness of breath      . aspirin EC 81 MG tablet Take 81 mg by mouth daily.        . cholecalciferol (VITAMIN D) 400 UNITS TABS Take 2,000 Units by mouth daily.        . clopidogrel (PLAVIX) 75 MG tablet Take 1 tablet (75 mg  total) by mouth daily with breakfast.  30 tablet  3  . enalapril (VASOTEC) 20 MG tablet Take 20 mg by mouth daily.        . Flaxseed, Linseed, (FLAXSEED OIL) 1000 MG CAPS Take 1,000 mg by mouth daily.        Marland Kitchen HYDROcodone-acetaminophen (VICODIN) 5-500 MG per tablet Take 1 tablet by mouth every 6 (six) hours as needed. For pain      . hydroxypropyl methylcellulose (ISOPTO TEARS) 2.5 % ophthalmic solution Place 1 drop into both eyes daily.       . metoprolol tartrate (LOPRESSOR) 12.5 mg TABS Take 0.5 tablets (12.5 mg total) by mouth 2 (two) times daily.  60 tablet  60  . mometasone (ASMANEX) 220 MCG/INH inhaler Inhale 1 puff into the lungs at bedtime.        . Multiple Vitamin (MULTIVITAMIN) tablet Take 1 tablet by mouth daily.      . nitroGLYCERIN (NITROSTAT) 0.4 MG SL tablet Place 1 tablet (0.4 mg total) under the tongue every 5 (five) minutes as needed for chest pain.  25 tablet  3  . omega-3 acid ethyl esters (LOVAZA) 1 G capsule Take 1 g by mouth 2 (two) times daily.        . pantoprazole (PROTONIX) 40 MG tablet Take 1 tablet (40 mg total) by mouth daily at 12 noon.  30 tablet  6  . simvastatin (ZOCOR) 80 MG  tablet Take 80 mg by mouth at bedtime.          Allergies  Allergen Reactions  . Bee Venom     unknown    Past Medical History  Diagnosis Date  . Hypertension   . CVA (cerebral infarction) 2011    Right cerebral; total obstruction of the right ICA  . Coronary artery disease     S/p PCI 2011;  NSTEMI 12/12:  LHC/PCI 02/23/11: LAD 60% after the septal perforator, D1 occluded with distal collaterals, proximal RI 30-40%, AV circumflex stent patent with 60% stenosis after the stent, RCA 99%, EF 60-65%.  His RCA was treated with a bare-metal stent  . Tobacco abuse, in remission   . Renal carcinoma   . COPD (chronic obstructive pulmonary disease)     Past Surgical History  Procedure Date  . Hip relacement   . Brain surgery   . Kidney surgery   . Colon surgery     No family  history on file.  History   Social History  . Marital Status: Divorced    Spouse Name: N/A    Number of Children: N/A  . Years of Education: N/A   Occupational History  . Not on file.   Social History Main Topics  . Smoking status: Former Smoker    Types: Cigarettes    Quit date: 02/24/2007  . Smokeless tobacco: Not on file  . Alcohol Use: Yes  . Drug Use: No  . Sexually Active: Not on file   Other Topics Concern  . Not on file   Social History Narrative  . No narrative on file    ROS: Please see the HPI.  All other systems reviewed and negative.  PHYSICAL EXAM:  BP 126/64  Pulse 73  Ht 6\' 2"  (1.88 m)  Wt 91.082 kg (200 lb 12.8 oz)  BMI 25.78 kg/m2  General: Well developed, well nourished, in no acute distress. Head:  Normocephalic and atraumatic. Neck: no JVD Lungs: Clear to auscultation and percussion. Heart: Normal S1 and S2.  No murmur, rubs or gallops.  Abdomen:  Normal bowel sounds; soft; non tender; no organomegaly Pulses: Pulses normal in all 4 extremities. Extremities: No clubbing or cyanosis. No edema. Neurologic: Alert and oriented x 3.  EKG:  NSR.  Possible LAE.  Delay in R wave progression likely secondary to lead placement.   ASSESSMENT AND PLAN:

## 2011-03-23 NOTE — Patient Instructions (Signed)
Your physician recommends that you schedule a follow-up appointment in: [place] has recommended you make the following change in your medication: DECREASE Simvastatin to 80mg  take one-half tablet by mouth daily

## 2011-03-23 NOTE — Assessment & Plan Note (Signed)
Repeat cath study showed patent stent.  He does have a diagonal occlusion.  Will continue medical therapy.

## 2011-03-23 NOTE — Assessment & Plan Note (Signed)
MRI results given to patient.  Has abnormal lesions with several months follow up recommended preferably at the [place] in [place].  Also has cholelithiasis.  Will remain on DAPT.

## 2011-03-23 NOTE — Assessment & Plan Note (Signed)
simva cut to 40mg .  Need and rec of FDA given to patient.  He will have follow up at [facility].

## 2011-03-25 ENCOUNTER — Ambulatory Visit: Payer: Medicare Other | Admitting: Cardiology

## 2011-03-26 ENCOUNTER — Encounter: Payer: Medicare Other | Admitting: Physician Assistant

## 2011-04-23 ENCOUNTER — Encounter: Payer: Self-pay | Admitting: Cardiology

## 2011-04-23 ENCOUNTER — Ambulatory Visit (INDEPENDENT_AMBULATORY_CARE_PROVIDER_SITE_OTHER): Payer: Medicare Other | Admitting: Cardiology

## 2011-04-23 VITALS — BP 150/82 | HR 66 | Ht 74.0 in | Wt 202.8 lb

## 2011-04-23 DIAGNOSIS — K7689 Other specified diseases of liver: Secondary | ICD-10-CM

## 2011-04-23 DIAGNOSIS — I10 Essential (primary) hypertension: Secondary | ICD-10-CM

## 2011-04-23 DIAGNOSIS — K769 Liver disease, unspecified: Secondary | ICD-10-CM

## 2011-04-23 DIAGNOSIS — I4891 Unspecified atrial fibrillation: Secondary | ICD-10-CM

## 2011-04-23 DIAGNOSIS — K8020 Calculus of gallbladder without cholecystitis without obstruction: Secondary | ICD-10-CM

## 2011-04-23 DIAGNOSIS — I2510 Atherosclerotic heart disease of native coronary artery without angina pectoris: Secondary | ICD-10-CM

## 2011-04-23 DIAGNOSIS — E785 Hyperlipidemia, unspecified: Secondary | ICD-10-CM

## 2011-04-23 DIAGNOSIS — R911 Solitary pulmonary nodule: Secondary | ICD-10-CM

## 2011-04-23 NOTE — Patient Instructions (Signed)
Your physician recommends that you schedule a follow-up appointment in: 3 MONTHS  Your physician recommends that you continue on your current medications as directed. Please refer to the Current Medication list given to you today.   

## 2011-04-23 NOTE — Progress Notes (Signed)
HPI:  [name] is in for follow up.  He has been relatively stable.  He denies chest pain at this time.  He says he does worry all of the time.  I told him I was concerned in that his care is a bit disjointed between here and the [facility] [place].  He also tells me that he only sleeps about 3-4 hours per night for a variety of reasons.  He did say he brought his information from [facility] to the [place] is [facility] where he gets his overall care.  I reemphasized to him the need for this to be followed, and he assured me that a six month follow up at the [facility] [place] with regard to his MRI was planned.  From a cardiac perspective, he has been stable since discharged from the hospital.  No new symptoms.  He has an abnormal MRI with both liver and kidney lesions, but MRI follow up at the [place] has been suggested.  He also has a lung nodule, 13mm, and a follow up study in twelve months was suggested.  Finally he also has cholelithiasis---he was seen by [facility] per patient, but is now followed at the [facility].  He suggests that from time to time he will get RUQ pain with radiation to the right scapula.  He says that he has discussed it with his MD at the [facility] [place], so we will leave it there as this is where his care is mostly covered.    Current Outpatient Prescriptions  Medication Sig Dispense Refill  . albuterol-ipratropium (COMBIVENT) 18-103 MCG/ACT inhaler Inhale 2 puffs into the lungs every 6 (six) hours as needed. For shortness of breath      . aspirin EC 81 MG tablet Take 81 mg by mouth daily.        . cholecalciferol (VITAMIN D) 400 UNITS TABS Take 2,000 Units by mouth daily.        . clopidogrel (PLAVIX) 75 MG tablet Take 1 tablet (75 mg total) by mouth daily with breakfast.  30 tablet  3  . enalapril (VASOTEC) 20 MG tablet Take 20 mg by mouth daily.        . Flaxseed, Linseed, (FLAXSEED OIL) 1000 MG CAPS Take 1,000 mg by mouth daily.        Marland Kitchen HYDROcodone-acetaminophen (VICODIN) 5-500 MG per tablet Take 1 tablet by mouth every 6  (six) hours as needed. For pain      . hydroxypropyl methylcellulose (ISOPTO TEARS) 2.5 % ophthalmic solution Place 1 drop into both eyes daily.       . metoprolol tartrate (LOPRESSOR) 12.5 mg TABS Take 0.5 tablets (12.5 mg total) by mouth 2 (two) times daily.  60 tablet  60  . mometasone (ASMANEX) 220 MCG/INH inhaler Inhale 1 puff into the lungs at bedtime.        . Multiple Vitamin (MULTIVITAMIN) tablet Take 1 tablet by mouth daily.      . nitroGLYCERIN (NITROSTAT) 0.4 MG SL tablet Place 1 tablet (0.4 mg total) under the tongue every 5 (five) minutes as needed for chest pain.  25 tablet  3  . omega-3 acid ethyl esters (LOVAZA) 1 G capsule Take 1 g by mouth 2 (two) times daily.        . pantoprazole (PROTONIX) 40 MG tablet Take 40 mg by mouth 2 (two) times daily.      . simvastatin (ZOCOR) 80 MG tablet Take 0.5 tablets (40 mg total) by mouth at bedtime.  Allergies  Allergen Reactions  . Bee Venom     unknown    Past Medical History  Diagnosis Date  . Hypertension   . CVA (cerebral infarction) 2011    Right cerebral; total obstruction of the right ICA  . Coronary artery disease     S/p PCI 2011;  NSTEMI 12/12:  LHC/PCI 02/23/11: LAD 60% after the septal perforator, D1 occluded with distal collaterals, proximal RI 30-40%, AV circumflex stent patent with 60% stenosis after the stent, RCA 99%, EF 60-65%.  His RCA was treated with a bare-metal stent  . Tobacco abuse, in remission   . Renal carcinoma   . COPD (chronic obstructive pulmonary disease)     Past Surgical History  Procedure Date  . Hip relacement   . Brain surgery   . Kidney surgery   . Colon surgery     No family history on file.  History   Social History  . Marital Status: Divorced    Spouse Name: N/A    Number of Children: N/A  . Years of Education: N/A   Occupational History  . Not on file.   Social History Main Topics  . Smoking status: Former Smoker    Types: Cigarettes    Quit date: 02/24/2007    . Smokeless tobacco: Not on file  . Alcohol Use: Yes  . Drug Use: No  . Sexually Active: Not on file   Other Topics Concern  . Not on file   Social History Narrative  . No narrative on file    ROS: Please see the HPI.  All other systems reviewed and negative.  PHYSICAL EXAM:  BP 150/82  Pulse 66  Ht 6\' 2"  (1.88 m)  Wt 202 lb 12.8 oz (91.989 kg)  BMI 26.04 kg/m2  General: Well developed, well nourished, in no acute distress. Head:  Normocephalic and atraumatic. Neck: no JVD Lungs: Clear to auscultation and percussion. Heart: Normal S1 and S2.  No murmur, rubs or gallops.  Abdomen:  Normal bowel sounds; soft; non tender; no organomegaly.  No masses, and no RUQ tenderness today on exam.   Pulses: Pulses normal in all 4 extremities. Extremities: No clubbing or cyanosis. No edema. Neurologic: Alert and oriented x 3.  EKG:  NSR.  Delay in R wave progression.  No acute changes.    ASSESSMENT AND PLAN:

## 2011-04-26 DIAGNOSIS — K8020 Calculus of gallbladder without cholecystitis without obstruction: Secondary | ICD-10-CM | POA: Insufficient documentation

## 2011-04-26 DIAGNOSIS — I4891 Unspecified atrial fibrillation: Secondary | ICD-10-CM | POA: Insufficient documentation

## 2011-04-26 DIAGNOSIS — I480 Paroxysmal atrial fibrillation: Secondary | ICD-10-CM | POA: Insufficient documentation

## 2011-04-26 NOTE — Assessment & Plan Note (Signed)
Noted in cath lab at the time of his urgent cath, done in the setting of very unstable angina.  He has not had clinical recurrence.  We may elect to do a holter monitor, but would be reluctant to use warfarin at present---has hemorrhagic renal cysts, some disjointed care, and not clear that afib has been recurrent or a continuing issue.

## 2011-04-26 NOTE — Assessment & Plan Note (Signed)
Borderline elevation.

## 2011-04-26 NOTE — Assessment & Plan Note (Signed)
Currently on Simva 40mg  through the [facility] [place] system.

## 2011-04-26 NOTE — Assessment & Plan Note (Signed)
I reminded him to discuss with folks at the [place].

## 2011-04-26 NOTE — Assessment & Plan Note (Signed)
See follow up.  He is not currently having any chest pain and is doing well from that standpoint.  He will continue to follow with me.  He is on DAPT for now per guidelines, but does have BMS in place, and is now out more than two months, so some flexibility in treatment.

## 2011-04-26 NOTE — Assessment & Plan Note (Signed)
See overview.  One year follow up was suggested.  TS

## 2011-04-26 NOTE — Assessment & Plan Note (Signed)
Abnormal MRI.  He carried his records to the [facility], and tells me they are planning a follow up in six months.

## 2011-07-21 ENCOUNTER — Ambulatory Visit: Payer: Medicare Other | Admitting: Cardiology

## 2011-08-20 ENCOUNTER — Ambulatory Visit: Payer: Medicare Other | Admitting: Cardiology

## 2011-08-25 ENCOUNTER — Ambulatory Visit (INDEPENDENT_AMBULATORY_CARE_PROVIDER_SITE_OTHER): Payer: Medicare Other | Admitting: Cardiology

## 2011-08-25 ENCOUNTER — Encounter: Payer: Self-pay | Admitting: Cardiology

## 2011-08-25 VITALS — BP 152/70 | HR 72 | Ht 74.0 in | Wt 210.0 lb

## 2011-08-25 DIAGNOSIS — E785 Hyperlipidemia, unspecified: Secondary | ICD-10-CM

## 2011-08-25 DIAGNOSIS — I10 Essential (primary) hypertension: Secondary | ICD-10-CM

## 2011-08-25 DIAGNOSIS — I2510 Atherosclerotic heart disease of native coronary artery without angina pectoris: Secondary | ICD-10-CM

## 2011-08-25 DIAGNOSIS — I4891 Unspecified atrial fibrillation: Secondary | ICD-10-CM

## 2011-08-25 DIAGNOSIS — R911 Solitary pulmonary nodule: Secondary | ICD-10-CM

## 2011-08-25 NOTE — Progress Notes (Signed)
HPI:  The patient is in for followup. He's been seen at the [facility]. Think he potentially needs to have his gallbladder removed. 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 He continues to have some discomfort intermittently in his right upper quadrant. He's getting out at a time period where certainly this could be feasible, and we decided today would make arrangements for an exercise tolerance test if he did well he probably could go ahead and get this done. Notably, the patient had bare-metal stent placed.  Current Outpatient Prescriptions  Medication Sig Dispense Refill  . albuterol-ipratropium (COMBIVENT) 18-103 MCG/ACT inhaler Inhale 4 puffs into the lungs daily. For shortness of breath      . aspirin EC 81 MG tablet Take 81 mg by mouth daily.        . cholecalciferol (VITAMIN D) 400 UNITS TABS Take 2,000 Units by mouth daily.        . clopidogrel (PLAVIX) 75 MG tablet Take 1 tablet (75 mg total) by mouth daily with breakfast.  30 tablet  3  . enalapril (VASOTEC) 20 MG tablet Take 20 mg by mouth daily.        . Flaxseed, Linseed, (FLAXSEED OIL) 1000 MG CAPS Take 1,000 mg by mouth daily.        Marland Kitchen HYDROcodone-acetaminophen (VICODIN) 5-500 MG per tablet Take 1 tablet by mouth every 6 (six) hours as needed. For pain      . hydroxypropyl methylcellulose (ISOPTO TEARS) 2.5 % ophthalmic solution Place 1 drop into both eyes daily.       . metoprolol tartrate (LOPRESSOR) 12.5 mg TABS Take 0.5 tablets (12.5 mg total) by mouth 2 (two) times daily.  60 tablet  60  . mometasone (ASMANEX) 220 MCG/INH inhaler Inhale 1 puff into the lungs at bedtime.        . Multiple Vitamin (MULTIVITAMIN) tablet Take 1 tablet by mouth daily.      . nitroGLYCERIN (NITROSTAT) 0.4 MG SL tablet Place 1 tablet (0.4 mg total) under the tongue every 5 (five) minutes as needed for chest pain.  25 tablet  3  . omega-3 acid ethyl esters (LOVAZA) 1 G capsule Take 1 g by mouth 2 (two)  times daily.        . pantoprazole (PROTONIX) 40 MG tablet Take 40 mg by mouth 2 (two) times daily.      . simvastatin (ZOCOR) 80 MG tablet Take 0.5 tablets (40 mg total) by mouth at bedtime.        Allergies  Allergen Reactions  . Bee Venom     unknown    Past Medical History  Diagnosis Date  . Hypertension   . CVA (cerebral infarction) 2011    Right cerebral; total obstruction of the right ICA  . Coronary artery disease     S/p PCI 2011;  NSTEMI 12/12:  LHC/PCI 02/23/11: LAD 60% after the septal perforator, D1 occluded with distal collaterals, proximal RI 30-40%, AV circumflex stent patent with 60% stenosis after the stent, RCA 99%, EF 60-65%.  His RCA was treated with a bare-metal stent  . Tobacco abuse, in remission   . Renal carcinoma   . COPD (chronic obstructive pulmonary disease)     Past Surgical History  Procedure Date  . Hip relacement   . Brain surgery   . Kidney surgery   . Colon surgery     No family history on file.  History  Social History  . Marital Status: Divorced    Spouse Name: N/A    Number of Children: N/A  . Years of Education: N/A   Occupational History  . Not on file.   Social History Main Topics  . Smoking status: Former Smoker    Types: Cigarettes    Quit date: 02/24/2007  . Smokeless tobacco: Never Used  . Alcohol Use: Yes  . Drug Use: No  . Sexually Active: Not on file   Other Topics Concern  . Not on file   Social History Narrative  . No narrative on file    ROS: Please see the HPI.  All other systems reviewed and negative.  PHYSICAL EXAM:  BP 152/70  Pulse 72  Ht 6\' 2"  (1.88 m)  Wt 210 lb (95.255 kg)  BMI 26.96 kg/m2  General: Well developed, well nourished, in no acute distress. Head:  Normocephalic and atraumatic. Neck: no JVD Lungs: Clear to auscultation and percussion. Heart: Normal S1 and S2.  No murmur, rubs or gallops.  Abdomen:  Normal bowel sounds; soft; non tender; no organomegaly Pulses: Pulses  normal in all 4 extremities. Extremities: No clubbing or cyanosis. No edema. Neurologic: Alert and oriented x 3.  EKG:  NSR.  Delay in R wave progression, likely due to lead position.    ASSESSMENT AND PLAN:

## 2011-08-25 NOTE — Patient Instructions (Addendum)
Your physician has requested that you have an exercise tolerance test. For further information please visit [email or web address]. Please also follow instruction sheet, as given.   

## 2011-08-30 NOTE — Assessment & Plan Note (Signed)
Being followed at the [facility] hospital.

## 2011-08-30 NOTE — Assessment & Plan Note (Signed)
Patient has had cath with BMS.  He is now out more than six months.  He is to undergone exercise stress testing. If he has important gallstone disease, and it sounds as though he does  (followed at the [place]), I think he could likely undergo the surgery.

## 2011-08-30 NOTE — Assessment & Plan Note (Signed)
Patient being followed at the [place].

## 2011-08-30 NOTE — Assessment & Plan Note (Signed)
See above.  Should have a follow up CT at the end of the year.  Patient is aware and gets most of his studies done at the [facility] hospital.

## 2011-08-30 NOTE — Assessment & Plan Note (Signed)
Borderline elevation.

## 2011-09-16 ENCOUNTER — Emergency Department (HOSPITAL_COMMUNITY)
Admission: EM | Admit: 2011-09-16 | Discharge: 2011-09-17 | Disposition: A | Discharge status: Home / Self Care | Payer: Medicare Other | Attending: Emergency Medicine | Admitting: Emergency Medicine

## 2011-09-16 ENCOUNTER — Encounter (HOSPITAL_COMMUNITY): Payer: Self-pay | Admitting: Emergency Medicine

## 2011-09-16 ENCOUNTER — Emergency Department (HOSPITAL_COMMUNITY): Payer: Medicare Other

## 2011-09-16 DIAGNOSIS — I2510 Atherosclerotic heart disease of native coronary artery without angina pectoris: Secondary | ICD-10-CM | POA: Insufficient documentation

## 2011-09-16 DIAGNOSIS — J029 Acute pharyngitis, unspecified: Secondary | ICD-10-CM | POA: Insufficient documentation

## 2011-09-16 DIAGNOSIS — I10 Essential (primary) hypertension: Secondary | ICD-10-CM | POA: Insufficient documentation

## 2011-09-16 DIAGNOSIS — R0989 Other specified symptoms and signs involving the circulatory and respiratory systems: Secondary | ICD-10-CM

## 2011-09-16 DIAGNOSIS — R6889 Other general symptoms and signs: Secondary | ICD-10-CM | POA: Insufficient documentation

## 2011-09-16 NOTE — ED Notes (Signed)
Pt alert, arrives from home, c/o FB in throat, onset this evening, states ate fish and watermelon, resp even unlabored, no stridor noted, states sensation remains

## 2011-09-16 NOTE — ED Provider Notes (Signed)
History     CSN: [id number]  Arrival date & time 09/16/11  2148   First MD Initiated Contact with Patient 09/16/11 2319      Chief Complaint  Patient presents with  . Foreign Body    (Consider location/radiation/quality/duration/timing/severity/associated sxs/prior treatment) HPI Is a 77 year old white male who ate dinner about 9:30. He felt like something got stuck in his throat. He felt that he was unable to cough it up and became anxious. The symptoms were moderate. The sense of dyspnea has since abated but he feels like there is still some swelling in his throat. He has not vomited. He states he is able to swallow. It does not hurt to swallow. He is having no difficulty speaking.  Past Medical History  Diagnosis Date  . Hypertension   . CVA (cerebral infarction) 2011    Right cerebral; total obstruction of the right ICA  . Coronary artery disease     S/p PCI 2011;  NSTEMI 12/12:  LHC/PCI 02/23/11: LAD 60% after the septal perforator, D1 occluded with distal collaterals, proximal RI 30-40%, AV circumflex stent patent with 60% stenosis after the stent, RCA 99%, EF 60-65%.  His RCA was treated with a bare-metal stent  . Tobacco abuse, in remission   . Renal carcinoma   . COPD (chronic obstructive pulmonary disease)     Past Surgical History  Procedure Date  . Hip relacement   . Brain surgery   . Kidney surgery   . Colon surgery     No family history on file.  History  Substance Use Topics  . Smoking status: Former Smoker    Types: Cigarettes    Quit date: 02/24/2007  . Smokeless tobacco: Never Used  . Alcohol Use: Yes      Review of Systems  All other systems reviewed and are negative.    Allergies  Bee venom  Home Medications   Current Outpatient Rx  Name Route Sig Dispense Refill  . IPRATROPIUM-ALBUTEROL 18-103 MCG/ACT IN AERO Inhalation Inhale 4 puffs into the lungs daily. For shortness of breath    . ASPIRIN EC 81 MG PO TBEC Oral Take 81 mg by  mouth daily.      . CHOLECALCIFEROL 400 UNITS PO TABS Oral Take 2,000 Units by mouth daily.      Marland Kitchen CLOPIDOGREL BISULFATE 75 MG PO TABS Oral Take 1 tablet (75 mg total) by mouth daily with breakfast. 30 tablet 3  . ENALAPRIL MALEATE 20 MG PO TABS Oral Take 20 mg by mouth daily.      Marland Kitchen FLAXSEED OIL 1000 MG PO CAPS Oral Take 1,000 mg by mouth 2 (two) times daily.     Marland Kitchen HYDROCODONE-ACETAMINOPHEN 5-325 MG PO TABS Oral Take 1 tablet by mouth 2 (two) times daily.    Marland Kitchen HYPROMELLOSE 2.5 % OP SOLN Both Eyes Place 1 drop into both eyes daily.     Marland Kitchen METOPROLOL TARTRATE 12.5 MG HALF TABLET Oral Take 0.5 tablets (12.5 mg total) by mouth 2 (two) times daily. 60 tablet 60  . MOMETASONE FUROATE 220 MCG/INH IN AEPB Inhalation Inhale 1 puff into the lungs at bedtime.      Marland Kitchen ONE-DAILY MULTI VITAMINS PO TABS Oral Take 1 tablet by mouth daily.    Marland Kitchen NITROGLYCERIN 0.4 MG SL SUBL Sublingual Place 1 tablet (0.4 mg total) under the tongue every 5 (five) minutes as needed for chest pain. 25 tablet 3  . OMEGA-3-ACID ETHYL ESTERS 1 G PO CAPS Oral Take 1 g  by mouth 2 (two) times daily.      Marland Kitchen PANTOPRAZOLE SODIUM 40 MG PO TBEC Oral Take 40 mg by mouth daily.     Marland Kitchen SIMVASTATIN 80 MG PO TABS Oral Take 0.5 tablets (40 mg total) by mouth at bedtime.      BP 155/70  Pulse 82  Temp 98 F (36.7 C) (Oral)  Resp 20  SpO2 94%  Physical Exam General: Well-developed, well-nourished male in no acute distress; appearance consistent with age of record HENT: normocephalic, atraumatic; edentulous; no pharyngeal edema or foreign body seen Eyes: pupils equal round and reactive to light; extraocular muscles intact Neck: supple Heart: regular rate and rhythm Lungs: clear to auscultation bilaterally; no stridor Abdomen: soft; nondistended Extremities: No deformity; full range of motion; no edema Neurologic: Awake, alert and oriented; motor function intact in all extremities and symmetric; no facial droop Skin: Warm and dry Psychiatric:  Normal mood and affect    ED Course  Procedures (including critical care time)     MDM   Nursing notes and vitals signs, including pulse oximetry, reviewed.  Summary of this visit's results, reviewed by myself:   Imaging Studies: Dg Neck Soft Tissue  09/17/2011  *RADIOLOGY REPORT*  Clinical Data: Fish bone stuck in throat with sore throat.  NECK SOFT TISSUES - 1+ VIEW  Comparison: None.  Findings: No visible foreign body in the neck. No abnormal soft tissue swelling.  Airway shows normal patency.  Calcified atherosclerotic plaque is noted in the region of the left carotid bifurcation.  Spondylosis of the cervical spine, most prominently at C4-5, C5-6 and C6-7.  IMPRESSION: No visible foreign body in the neck or soft tissue swelling. Incidental calcified atherosclerotic plaque at the left carotid bifurcation.  Original Report Authenticated By: Azzie Roup, M.D.      11:50 PM Able swallow fluids without difficulty.      Wynetta Fines, MD 09/17/11 0003

## 2011-09-16 NOTE — ED Notes (Signed)
Pt was able to tolerate fluids.

## 2011-09-16 NOTE — ED Notes (Signed)
Pt states he was eating Tile Fish and then a personal watermelon at 2130. Pt states he feels like something is stuck in his throat. Pt states he had an allergic reaction to bee stings before and had his throat tighten up. He states he feels like whatever was stuck is gone now, but his throat feels kind of tight.

## 2011-09-24 ENCOUNTER — Encounter: Payer: Self-pay | Admitting: Physician Assistant

## 2011-09-24 ENCOUNTER — Ambulatory Visit (INDEPENDENT_AMBULATORY_CARE_PROVIDER_SITE_OTHER): Payer: Medicare Other | Admitting: Physician Assistant

## 2011-09-24 DIAGNOSIS — I4891 Unspecified atrial fibrillation: Secondary | ICD-10-CM

## 2011-09-24 NOTE — Procedures (Signed)
Exercise Treadmill Test  Pre-Exercise Testing Evaluation Rhythm: normal sinus  Rate: 71   PR:  .18 QRS:  .10  QT:  .38 QTc: .42     Test  Exercise Tolerance Test Ordering MD: [name], MD  Interpreting MD: Richardson Dopp PA-C  Unique Test No: 1  Treadmill:  1  Indication for ETT: A-FIB  Contraindication to ETT: No   Stress Modality: exercise - treadmill  Cardiac Imaging Performed: non   Protocol: standard Bruce - maximal  Max BP:  201/81  Max MPHR (bpm):  146 85% MPR (bpm):  124  MPHR obtained (bpm):  102 % MPHR obtained:  70%  Reached 85% MPHR (min:sec):  n/a Total Exercise Time (min-sec):  3:13  Workload in METS:  4.6 Borg Scale: 15  Reason ETT Terminated:  patient's desire to stop    ST Segment Analysis At Rest: normal ST segments - no evidence of significant ST depression With Exercise: no evidence of significant ST depression at submaximal exercise.   Other Information Arrhythmia:  No Angina during ETT:  absent (0) Quality of ETT:  non-diagnostic  ETT Interpretation:  Patient did not achieve target heart rate.  Comments: Poor exercise tolerance. No chest pain. Normal BP response to exercise. No ST-T changes to suggest ischemia at submaximal exercise.   Recommendations: Discussed with Dr.  [name]. He feels his ETT is low risk enough.  No further testing indicated. He may proceed with his surgery at acceptable risk. Richardson Dopp, PA-C  11:39 AM 09/24/2011

## 2011-10-06 ENCOUNTER — Telehealth: Payer: Self-pay | Admitting: Cardiology

## 2011-10-06 NOTE — Telephone Encounter (Signed)
Left message to call back.  I will speak with this pt when he calls back.

## 2011-10-06 NOTE — Telephone Encounter (Signed)
Wayne Meadows called back and said that they did receive the fax our office had sent previously.  She said the clearance note had been turned into pre-op and anesthesia. I left a message on the pt's voicemail to make him aware of this information.

## 2011-10-06 NOTE — Telephone Encounter (Signed)
I spoke with the pt and he said the fax number that we are using for the [place] is incorrect.  I have a left a message for Emy to contact our office with a correct fax number.

## 2011-10-06 NOTE — Telephone Encounter (Signed)
F/u  Returning call back to [place].

## 2011-10-06 NOTE — Telephone Encounter (Signed)
New problem:  patient calling - check on status of cardiac clearance for the [place] . Had stress test done. Left paperwork with Richardson Dopp 8/1.

## 2011-10-25 ENCOUNTER — Emergency Department (HOSPITAL_COMMUNITY): Payer: Medicare Other

## 2011-10-25 ENCOUNTER — Encounter (HOSPITAL_COMMUNITY): Payer: Self-pay | Admitting: *Deleted

## 2011-10-25 ENCOUNTER — Inpatient Hospital Stay (HOSPITAL_COMMUNITY)
Admission: EM | Admit: 2011-10-25 | Discharge: 2011-10-28 | DRG: 195 | Disposition: A | Discharge status: Home Health | Payer: Medicare Other | Attending: Internal Medicine | Admitting: Internal Medicine

## 2011-10-25 DIAGNOSIS — Z9861 Coronary angioplasty status: Secondary | ICD-10-CM

## 2011-10-25 DIAGNOSIS — I679 Cerebrovascular disease, unspecified: Secondary | ICD-10-CM

## 2011-10-25 DIAGNOSIS — E785 Hyperlipidemia, unspecified: Secondary | ICD-10-CM

## 2011-10-25 DIAGNOSIS — Z87891 Personal history of nicotine dependence: Secondary | ICD-10-CM

## 2011-10-25 DIAGNOSIS — Z85528 Personal history of other malignant neoplasm of kidney: Secondary | ICD-10-CM

## 2011-10-25 DIAGNOSIS — J449 Chronic obstructive pulmonary disease, unspecified: Secondary | ICD-10-CM | POA: Diagnosis present

## 2011-10-25 DIAGNOSIS — I4891 Unspecified atrial fibrillation: Secondary | ICD-10-CM

## 2011-10-25 DIAGNOSIS — Z96649 Presence of unspecified artificial hip joint: Secondary | ICD-10-CM

## 2011-10-25 DIAGNOSIS — I2510 Atherosclerotic heart disease of native coronary artery without angina pectoris: Secondary | ICD-10-CM | POA: Diagnosis present

## 2011-10-25 DIAGNOSIS — Z8673 Personal history of transient ischemic attack (TIA), and cerebral infarction without residual deficits: Secondary | ICD-10-CM

## 2011-10-25 DIAGNOSIS — R0602 Shortness of breath: Secondary | ICD-10-CM

## 2011-10-25 DIAGNOSIS — I10 Essential (primary) hypertension: Secondary | ICD-10-CM | POA: Diagnosis present

## 2011-10-25 DIAGNOSIS — D72829 Elevated white blood cell count, unspecified: Secondary | ICD-10-CM

## 2011-10-25 DIAGNOSIS — J441 Chronic obstructive pulmonary disease with (acute) exacerbation: Secondary | ICD-10-CM | POA: Diagnosis present

## 2011-10-25 DIAGNOSIS — J189 Pneumonia, unspecified organism: Principal | ICD-10-CM | POA: Diagnosis present

## 2011-10-25 DIAGNOSIS — J4489 Other specified chronic obstructive pulmonary disease: Secondary | ICD-10-CM | POA: Diagnosis present

## 2011-10-25 DIAGNOSIS — R079 Chest pain, unspecified: Secondary | ICD-10-CM

## 2011-10-25 LAB — POCT I-STAT, CHEM 8
BUN: 13 mg/dL (ref 6–23)
Calcium, Ion: 1.2 mmol/L (ref 1.13–1.30)
Chloride: 105 mEq/L (ref 96–112)
Glucose, Bld: 122 mg/dL — ABNORMAL HIGH (ref 70–99)
TCO2: 20 mmol/L (ref 0–100)

## 2011-10-25 LAB — CBC WITH DIFFERENTIAL/PLATELET
Basophils Relative: 0 % (ref 0–1)
Eosinophils Absolute: 0.2 10*3/uL (ref 0.0–0.7)
Eosinophils Relative: 1 % (ref 0–5)
MCH: 31.2 pg (ref 26.0–34.0)
MCHC: 33.7 g/dL (ref 30.0–36.0)
MCV: 92.6 fL (ref 78.0–100.0)
Monocytes Relative: 8 % (ref 3–12)
Neutrophils Relative %: 85 % — ABNORMAL HIGH (ref 43–77)
Platelets: 240 10*3/uL (ref 150–400)

## 2011-10-25 LAB — PRO B NATRIURETIC PEPTIDE: Pro B Natriuretic peptide (BNP): 524.6 pg/mL — ABNORMAL HIGH (ref 0–125)

## 2011-10-25 LAB — PROTIME-INR
INR: 1.38 (ref 0.00–1.49)
Prothrombin Time: 17.2 seconds — ABNORMAL HIGH (ref 11.6–15.2)

## 2011-10-25 MED ORDER — IOHEXOL 350 MG/ML SOLN
100.0000 mL | Freq: Once | INTRAVENOUS | Status: AC | PRN
Start: 2011-10-25 — End: 2011-10-25
  Administered 2011-10-25: 100 mL via INTRAVENOUS

## 2011-10-25 NOTE — ED Notes (Signed)
IV team at pt bedside 

## 2011-10-25 NOTE — ED Provider Notes (Signed)
History     CSN: [id number]  Arrival date & time 10/25/11  1700   First MD Initiated Contact with Patient 10/25/11 2035      Chief Complaint  Patient presents with  . Chest Pain    left sided   Patient is a 77 year old male with past medical history relevant for hypertension, CVA, coronary artery disease status post stenting, and COPD who presents with complaints of increasing amounts of shortness of breath and chest pressure. Of note patient was recently hospitalized at [facility] in [place] for a cholecystectomy. Patient reports being discharged earlier today. However upon arrival at his home he noted that he became increasingly short of breath. He reports being able to do minimal activity with onset of shortness of breath. In regards to a chest pressure, pressure is located in substernal region and mild in severity.  Associated symptoms include nausea and decreased appetite.   (Consider location/radiation/quality/duration/timing/severity/associated sxs/prior treatment) Patient is a 77 y.o. male presenting with shortness of breath. The history is provided by the patient and medical records. No language interpreter was used.  Shortness of Breath  The current episode started today. The onset was gradual. The problem occurs continuously. The problem has been gradually worsening. The problem is severe. The symptoms are relieved by rest. The symptoms are aggravated by activity. Associated symptoms include chest pressure and shortness of breath. There was no intake of a foreign body. The Heimlich maneuver was not attempted.    Past Medical History  Diagnosis Date  . Hypertension   . CVA (cerebral infarction) 2011    Right cerebral; total obstruction of the right ICA  . Coronary artery disease     S/p PCI 2011;  NSTEMI 12/12:  LHC/PCI 02/23/11: LAD 60% after the septal perforator, D1 occluded with distal collaterals, proximal RI 30-40%, AV circumflex stent patent with 60% stenosis after the  stent, RCA 99%, EF 60-65%.  His RCA was treated with a bare-metal stent  . Tobacco abuse, in remission   . Renal carcinoma   . COPD (chronic obstructive pulmonary disease)     Past Surgical History  Procedure Date  . Hip relacement   . Brain surgery   . Kidney surgery   . Colon surgery     No family history on file.  History  Substance Use Topics  . Smoking status: Former Smoker    Types: Cigarettes    Quit date: 02/24/2007  . Smokeless tobacco: Never Used  . Alcohol Use: Yes      Review of Systems  Respiratory: Positive for shortness of breath.   All other systems reviewed and are negative.    Allergies  Bee venom  Home Medications   Current Outpatient Rx  Name Route Sig Dispense Refill  . IPRATROPIUM-ALBUTEROL 18-103 MCG/ACT IN AERO Inhalation Inhale 4 puffs into the lungs daily. For shortness of breath    . ASPIRIN EC 81 MG PO TBEC Oral Take 81 mg by mouth daily.      . CHOLECALCIFEROL 400 UNITS PO TABS Oral Take 2,000 Units by mouth daily.      Marland Kitchen CLOPIDOGREL BISULFATE 75 MG PO TABS Oral Take 1 tablet (75 mg total) by mouth daily with breakfast. 30 tablet 3  . ENALAPRIL MALEATE 20 MG PO TABS Oral Take 20 mg by mouth daily.      Marland Kitchen FLAXSEED OIL 1000 MG PO CAPS Oral Take 1,000 mg by mouth 2 (two) times daily.     Marland Kitchen HYDROCODONE-ACETAMINOPHEN 5-325 MG PO TABS  Oral Take 1 tablet by mouth 2 (two) times daily.    Marland Kitchen HYPROMELLOSE 2.5 % OP SOLN Both Eyes Place 1 drop into both eyes daily.     Marland Kitchen METOPROLOL TARTRATE 12.5 MG HALF TABLET Oral Take 0.5 tablets (12.5 mg total) by mouth 2 (two) times daily. 60 tablet 60  . MOMETASONE FUROATE 220 MCG/INH IN AEPB Inhalation Inhale 1 puff into the lungs at bedtime.      Marland Kitchen ONE-DAILY MULTI VITAMINS PO TABS Oral Take 1 tablet by mouth daily.    Marland Kitchen NITROGLYCERIN 0.4 MG SL SUBL Sublingual Place 1 tablet (0.4 mg total) under the tongue every 5 (five) minutes as needed for chest pain. 25 tablet 3  . OMEGA-3-ACID ETHYL ESTERS 1 G PO CAPS  Oral Take 1 g by mouth 2 (two) times daily.      Marland Kitchen PANTOPRAZOLE SODIUM 40 MG PO TBEC Oral Take 40 mg by mouth daily.     Marland Kitchen SIMVASTATIN 80 MG PO TABS Oral Take 0.5 tablets (40 mg total) by mouth at bedtime.      BP 174/86  Pulse 87  Temp 99.2 F (37.3 C) (Oral)  Resp 23  SpO2 96%  Physical Exam  Constitutional: He is oriented to person, place, and time. He appears well-developed and well-nourished.  HENT:  Head: Normocephalic and atraumatic.  Right Ear: External ear normal.  Left Ear: External ear normal.  Nose: Nose normal.  Mouth/Throat: Oropharynx is clear and moist.  Eyes: Conjunctivae and EOM are normal. Pupils are equal, round, and reactive to light.  Neck: Normal range of motion. Neck supple.  Cardiovascular: Normal rate, regular rhythm, normal heart sounds and intact distal pulses.  Exam reveals no gallop and no friction rub.   No murmur heard. Pulmonary/Chest: Effort normal and breath sounds normal. No respiratory distress. He has no wheezes. He has no rales. He exhibits no tenderness.  Abdominal: Soft. Bowel sounds are normal. He exhibits distension (mild). He exhibits no mass. There is tenderness (moderate diffuse). There is no rebound and no guarding.  Musculoskeletal: Normal range of motion. He exhibits no edema and no tenderness.  Neurological: He is alert and oriented to person, place, and time. He has normal reflexes.  Skin: Skin is warm and dry.  Psychiatric: He has a normal mood and affect.    ED Course  Procedures (including critical care time)  Labs Reviewed  CBC WITH DIFFERENTIAL - Abnormal; Notable for the following:    WBC 14.5 (*)     RBC 3.91 (*)     Hemoglobin 12.2 (*)     HCT 36.2 (*)     Neutrophils Relative 85 (*)     Neutro Abs 12.4 (*)     Lymphocytes Relative 6 (*)     Monocytes Absolute 1.1 (*)     All other components within normal limits  PROTIME-INR - Abnormal; Notable for the following:    Prothrombin Time 17.2 (*)     All other  components within normal limits  POCT I-STAT, CHEM 8 - Abnormal; Notable for the following:    Glucose, Bld 122 (*)     Hemoglobin 12.9 (*)     HCT 38.0 (*)     All other components within normal limits  PRO B NATRIURETIC PEPTIDE - Abnormal; Notable for the following:    Pro B Natriuretic peptide (BNP) 524.6 (*)     All other components within normal limits  TROPONIN I   Dg Chest 2 View  10/25/2011  *  RADIOLOGY REPORT*  Clinical Data: Shortness of breath, chest pain, cough, congestion, history hypertension, COPD, smoking, stenting, renal cell carcinoma  CHEST - 2 VIEW  Comparison: 03/17/2011  Findings: Borderline enlargement of cardiac silhouette. Slight pulmonary vascular congestion. Tortuous aorta. Bibasilar atelectasis. Underlying emphysematous changes. Small bibasilar pleural effusions. Remaining lungs clear. Bones demineralized.  IMPRESSION: Borderline enlargement of cardiac silhouette. Emphysematous changes with small bibasilar effusions and atelectasis.   Original Report Authenticated By: Burnetta Sabin, M.D.    Ct Angio Chest Pe W/cm &/or Wo Cm  10/26/2011  *RADIOLOGY REPORT*  Clinical Data: Recent laparoscopic cholecystectomy with subsequent shortness of breath and chest pain.  Question pulmonary embolism.  CT ANGIOGRAPHY CHEST  Technique:  Multidetector CT imaging of the chest using the standard protocol during bolus administration of intravenous contrast. Multiplanar reconstructed images including MIPs were obtained and reviewed to evaluate the vascular anatomy.  Contrast: 176mL OMNIPAQUE IOHEXOL 350 MG/ML SOLN  Comparison: Chest CT 02/22/2011.  Findings: The pulmonary arteries are well opacified with contrast. There is no evidence of acute pulmonary embolism.  There is stable mild great vessel, aortic and coronary artery atherosclerosis.  No enlarged mediastinal or hilar lymph nodes are present.  There is trace pleural fluid bilaterally.  There is no pericardial effusion.  A small hiatal  hernia appears stable.  Mild emphysematous changes are present.  There are increased dependent opacities in both lower lobes, primarily linear on the reformatted images.  Component in the right lower lobe is more confluent and could reflect pneumonia.  The visualized upper abdomen is unchanged with mild contour irregularity of the liver.  The previously noted liver lesions are not seen on this examination.  IMPRESSION:  1.  No evidence of acute pulmonary embolism. 2.  Increased dependent opacities at both lung bases may reflect atelectasis.  There is a more confluent component in the right lower lobe which could reflect pneumonia or aspiration.  Trace pleural fluid is present bilaterally. 3.  Stable atherosclerosis.   Original Report Authenticated By: Vivia Ewing, M.D.      Date: 10/25/2011  Rate: 87   Rhythm: normal sinus rhythm  QRS Axis: normal  Intervals: normal  ST/T Wave abnormalities: normal  Conduction Disutrbances:none  Narrative Interpretation:   Old EKG Reviewed: unchanged    1. Shortness of breath   2. Chest pain   3. Healthcare-associated pneumonia   4. Leukocytosis       MDM   Patient is a 77 year old male with past medical history significant for coronary artery disease, COPD, and recent cholecystectomy 3 days ago who presents to emergency department with complaints of shortness of breath and chest pressure.  Upon arrival in the emergency department patient's vital signs remarkable only for blood pressure of 161/66.  On exam patient with multiple surgical incision sites all over abdomen but overall appeared clean dry and intact. Rest of physical exam as above unremarkable. Patient was recently hospitalized there is concern for possible pneumonia versus pulmonary embolism. However given patient's history of coronary artery disease, ACS cannot be ruled out. Review of patient's workup shows the patient has a questionable right lower lobe pneumonia seen on CT scan. Review of  patient's labs shows that he has a leukocytosis. With clinical presentation, leukocytosis, and right lower lobe pneumonia on CT scan it was felt the patient needed treatment for healthcare associated pneumonia and was admitted to hospital service.        Corlis Leak, MD 10/26/11 [phone]

## 2011-10-25 NOTE — ED Notes (Signed)
IV team notified.

## 2011-10-25 NOTE — ED Notes (Signed)
PT reports he had his gallbladder out last week and was D/C on Friday. Surgery was done at [facility] hospital .

## 2011-10-25 NOTE — ED Notes (Signed)
Pt states he was D/C today post gall bladder surgery from [facility]. Pt states that on the way home "I just didn't feel good". Pt states he checked his BP and it was 220/170. Pt reports increased SOB on exertion and constant nausea.

## 2011-10-26 ENCOUNTER — Inpatient Hospital Stay (HOSPITAL_COMMUNITY): Payer: Medicare Other

## 2011-10-26 ENCOUNTER — Encounter (HOSPITAL_COMMUNITY): Payer: Self-pay | Admitting: Internal Medicine

## 2011-10-26 DIAGNOSIS — J189 Pneumonia, unspecified organism: Secondary | ICD-10-CM | POA: Diagnosis present

## 2011-10-26 DIAGNOSIS — I2510 Atherosclerotic heart disease of native coronary artery without angina pectoris: Secondary | ICD-10-CM

## 2011-10-26 DIAGNOSIS — J449 Chronic obstructive pulmonary disease, unspecified: Secondary | ICD-10-CM

## 2011-10-26 DIAGNOSIS — E785 Hyperlipidemia, unspecified: Secondary | ICD-10-CM

## 2011-10-26 LAB — CBC WITH DIFFERENTIAL/PLATELET
Eosinophils Absolute: 0.2 10*3/uL (ref 0.0–0.7)
Lymphocytes Relative: 9 % — ABNORMAL LOW (ref 12–46)
Lymphs Abs: 1.1 10*3/uL (ref 0.7–4.0)
Neutro Abs: 10.2 10*3/uL — ABNORMAL HIGH (ref 1.7–7.7)
Neutrophils Relative %: 83 % — ABNORMAL HIGH (ref 43–77)
Platelets: 218 10*3/uL (ref 150–400)
RBC: 3.61 MIL/uL — ABNORMAL LOW (ref 4.22–5.81)
WBC: 12.4 10*3/uL — ABNORMAL HIGH (ref 4.0–10.5)

## 2011-10-26 LAB — TROPONIN I: Troponin I: <0.3 ng/mL (ref <0.30)

## 2011-10-26 LAB — COMPREHENSIVE METABOLIC PANEL
BUN: 11 mg/dL (ref 6–23)
CO2: 21 mEq/L (ref 19–32)
Chloride: 105 mEq/L (ref 96–112)
Creatinine, Ser: 1 mg/dL (ref 0.50–1.35)
GFR calc Af Amer: 83 mL/min — ABNORMAL LOW (ref >90)
GFR calc non Af Amer: 72 mL/min — ABNORMAL LOW (ref >90)
Total Bilirubin: 1.1 mg/dL (ref 0.3–1.2)

## 2011-10-26 LAB — TSH: TSH: 1.768 u[IU]/mL (ref 0.350–4.500)

## 2011-10-26 MED ORDER — HYDROCODONE-ACETAMINOPHEN 5-325 MG PO TABS
1.0000 | ORAL_TABLET | ORAL | Status: DC | PRN
  Administered 2011-10-26 – 2011-10-28 (×7): 1 via ORAL
  Filled 2011-10-26 (×7): qty 1

## 2011-10-26 MED ORDER — BACLOFEN 10 MG PO TABS
10.0000 mg | ORAL_TABLET | Freq: Three times a day (TID) | ORAL | Status: DC
  Administered 2011-10-26 – 2011-10-28 (×8): 10 mg via ORAL
  Filled 2011-10-26 (×9): qty 1

## 2011-10-26 MED ORDER — IPRATROPIUM-ALBUTEROL 18-103 MCG/ACT IN AERO
2.0000 | INHALATION_SPRAY | RESPIRATORY_TRACT | Status: DC | PRN
  Administered 2011-10-28: 2 via RESPIRATORY_TRACT
  Filled 2011-10-26: qty 14.7

## 2011-10-26 MED ORDER — ACETAMINOPHEN 325 MG PO TABS
650.0000 mg | ORAL_TABLET | Freq: Four times a day (QID) | ORAL | Status: DC | PRN
  Administered 2011-10-26: 650 mg via ORAL
  Filled 2011-10-26: qty 2

## 2011-10-26 MED ORDER — ONDANSETRON HCL 4 MG PO TABS
4.0000 mg | ORAL_TABLET | Freq: Four times a day (QID) | ORAL | Status: DC | PRN
  Administered 2011-10-26: 4 mg via ORAL
  Filled 2011-10-26: qty 1

## 2011-10-26 MED ORDER — CYANOCOBALAMIN 500 MCG PO TABS
500.0000 ug | ORAL_TABLET | Freq: Every day | ORAL | Status: DC
  Administered 2011-10-26 – 2011-10-28 (×3): 500 ug via ORAL
  Filled 2011-10-26 (×3): qty 1

## 2011-10-26 MED ORDER — IPRATROPIUM-ALBUTEROL 18-103 MCG/ACT IN AERO
4.0000 | INHALATION_SPRAY | Freq: Every day | RESPIRATORY_TRACT | Status: DC
Start: 2011-10-26 — End: 2011-10-26
  Administered 2011-10-26: 4 via RESPIRATORY_TRACT
  Filled 2011-10-26: qty 14.7

## 2011-10-26 MED ORDER — VANCOMYCIN HCL 1000 MG IV SOLR
1500.0000 mg | Freq: Once | INTRAVENOUS | Status: AC
  Administered 2011-10-26: 1500 mg via INTRAVENOUS
  Filled 2011-10-26: qty 1500

## 2011-10-26 MED ORDER — FLUTICASONE PROPIONATE HFA 44 MCG/ACT IN AERO
2.0000 | INHALATION_SPRAY | Freq: Two times a day (BID) | RESPIRATORY_TRACT | Status: DC
  Administered 2011-10-26 – 2011-10-28 (×5): 2 via RESPIRATORY_TRACT
  Filled 2011-10-26: qty 10.6

## 2011-10-26 MED ORDER — POLYVINYL ALCOHOL 1.4 % OP SOLN
1.0000 [drp] | Freq: Every day | OPHTHALMIC | Status: DC
  Administered 2011-10-26 – 2011-10-28 (×3): 1 [drp] via OPHTHALMIC
  Filled 2011-10-26: qty 15

## 2011-10-26 MED ORDER — PANTOPRAZOLE SODIUM 40 MG PO TBEC
40.0000 mg | DELAYED_RELEASE_TABLET | Freq: Every day | ORAL | Status: DC
  Administered 2011-10-26 – 2011-10-28 (×3): 40 mg via ORAL
  Filled 2011-10-26 (×3): qty 1

## 2011-10-26 MED ORDER — LEVOFLOXACIN IN D5W 750 MG/150ML IV SOLN
750.0000 mg | INTRAVENOUS | Status: DC
  Administered 2011-10-26 – 2011-10-28 (×3): 750 mg via INTRAVENOUS
  Filled 2011-10-26 (×4): qty 150

## 2011-10-26 MED ORDER — HYDROCODONE-ACETAMINOPHEN 5-325 MG PO TABS
1.0000 | ORAL_TABLET | Freq: Two times a day (BID) | ORAL | Status: DC
  Administered 2011-10-26 (×2): 1 via ORAL
  Filled 2011-10-26 (×2): qty 1

## 2011-10-26 MED ORDER — OMEGA-3-ACID ETHYL ESTERS 1 G PO CAPS
1.0000 g | ORAL_CAPSULE | Freq: Two times a day (BID) | ORAL | Status: DC
  Administered 2011-10-26 – 2011-10-28 (×6): 1 g via ORAL
  Filled 2011-10-26 (×7): qty 1

## 2011-10-26 MED ORDER — VANCOMYCIN HCL 1000 MG IV SOLR
1250.0000 mg | Freq: Two times a day (BID) | INTRAVENOUS | Status: DC
  Administered 2011-10-26 – 2011-10-28 (×5): 1250 mg via INTRAVENOUS
  Filled 2011-10-26 (×6): qty 1250

## 2011-10-26 MED ORDER — SODIUM CHLORIDE 0.9 % IJ SOLN
3.0000 mL | Freq: Two times a day (BID) | INTRAMUSCULAR | Status: DC
  Administered 2011-10-26 – 2011-10-27 (×5): 3 mL via INTRAVENOUS

## 2011-10-26 MED ORDER — SIMVASTATIN 20 MG PO TABS
20.0000 mg | ORAL_TABLET | Freq: Every day | ORAL | Status: DC
  Administered 2011-10-26 – 2011-10-27 (×2): 20 mg via ORAL
  Filled 2011-10-26 (×3): qty 1

## 2011-10-26 MED ORDER — ONDANSETRON HCL 4 MG/2ML IJ SOLN: 4.0000 mg | Freq: Four times a day (QID) | INTRAMUSCULAR | Status: DC | PRN

## 2011-10-26 MED ORDER — ACETAMINOPHEN 650 MG RE SUPP: 650.0000 mg | Freq: Four times a day (QID) | RECTAL | Status: DC | PRN

## 2011-10-26 MED ORDER — ALBUTEROL SULFATE (5 MG/ML) 0.5% IN NEBU
2.5000 mg | INHALATION_SOLUTION | Freq: Four times a day (QID) | RESPIRATORY_TRACT | Status: DC | PRN
  Administered 2011-10-26 (×2): 2.5 mg via RESPIRATORY_TRACT
  Filled 2011-10-26 (×2): qty 0.5

## 2011-10-26 MED ORDER — ADULT MULTIVITAMIN W/MINERALS CH
1.0000 | ORAL_TABLET | Freq: Every day | ORAL | Status: DC
  Administered 2011-10-26 – 2011-10-28 (×3): 1 via ORAL
  Filled 2011-10-26 (×3): qty 1

## 2011-10-26 MED ORDER — ASPIRIN EC 81 MG PO TBEC
81.0000 mg | DELAYED_RELEASE_TABLET | Freq: Every day | ORAL | Status: DC
  Administered 2011-10-26 – 2011-10-28 (×3): 81 mg via ORAL
  Filled 2011-10-26 (×3): qty 1

## 2011-10-26 MED ORDER — SODIUM CHLORIDE 0.9 % IJ SOLN
3.0000 mL | Freq: Two times a day (BID) | INTRAMUSCULAR | Status: DC
  Administered 2011-10-27: 3 mL via INTRAVENOUS

## 2011-10-26 MED ORDER — METOPROLOL TARTRATE 12.5 MG HALF TABLET
12.5000 mg | ORAL_TABLET | Freq: Two times a day (BID) | ORAL | Status: DC
Start: 2011-10-26 — End: 2011-10-28
  Administered 2011-10-26 – 2011-10-28 (×6): 12.5 mg via ORAL
  Filled 2011-10-26 (×7): qty 1

## 2011-10-26 MED ORDER — ENALAPRIL MALEATE 20 MG PO TABS
20.0000 mg | ORAL_TABLET | Freq: Every day | ORAL | Status: DC
  Administered 2011-10-26 – 2011-10-28 (×3): 20 mg via ORAL
  Filled 2011-10-26 (×3): qty 1

## 2011-10-26 MED ORDER — ENOXAPARIN SODIUM 40 MG/0.4ML ~~LOC~~ SOLN
40.0000 mg | SUBCUTANEOUS | Status: DC
  Administered 2011-10-26 – 2011-10-28 (×3): 40 mg via SUBCUTANEOUS
  Filled 2011-10-26 (×3): qty 0.4

## 2011-10-26 MED ORDER — DEXTROSE 5 % IV SOLN
1.0000 g | Freq: Two times a day (BID) | INTRAVENOUS | Status: DC
  Administered 2011-10-26 – 2011-10-28 (×5): 1 g via INTRAVENOUS
  Filled 2011-10-26 (×8): qty 1

## 2011-10-26 MED ORDER — IPRATROPIUM-ALBUTEROL 18-103 MCG/ACT IN AERO
2.0000 | INHALATION_SPRAY | Freq: Two times a day (BID) | RESPIRATORY_TRACT | Status: DC
  Administered 2011-10-27 – 2011-10-28 (×4): 2 via RESPIRATORY_TRACT
  Filled 2011-10-26: qty 14.7

## 2011-10-26 MED ORDER — CLOPIDOGREL BISULFATE 75 MG PO TABS
75.0000 mg | ORAL_TABLET | Freq: Every day | ORAL | Status: DC
  Administered 2011-10-26 – 2011-10-28 (×3): 75 mg via ORAL
  Filled 2011-10-26 (×3): qty 1

## 2011-10-26 MED ORDER — ALBUTEROL SULFATE (5 MG/ML) 0.5% IN NEBU
INHALATION_SOLUTION | RESPIRATORY_TRACT | Status: AC
  Administered 2011-10-26: 2.5 mg via RESPIRATORY_TRACT
  Filled 2011-10-26: qty 0.5

## 2011-10-26 MED ORDER — PIPERACILLIN-TAZOBACTAM 3.375 G IVPB 30 MIN
3.3750 g | Freq: Once | INTRAVENOUS | Status: AC
  Administered 2011-10-26: 3.375 g via INTRAVENOUS
  Filled 2011-10-26: qty 50

## 2011-10-26 MED ORDER — VITAMIN D3 25 MCG (1000 UNIT) PO TABS
2000.0000 [IU] | ORAL_TABLET | Freq: Every day | ORAL | Status: DC
Start: 2011-10-26 — End: 2011-10-28
  Administered 2011-10-26 – 2011-10-28 (×3): 2000 [IU] via ORAL
  Filled 2011-10-26 (×3): qty 2

## 2011-10-26 MED ORDER — NITROGLYCERIN 0.4 MG SL SUBL: 0.4000 mg | SUBLINGUAL_TABLET | SUBLINGUAL | Status: DC | PRN

## 2011-10-26 NOTE — H&P (Signed)
Wayne Meadows is an 77 y.o. male.   Patient was seen and examined on November 02, 2011 at 1:20 AM. PCP - at [facility] medical center. Cardiologist - Dr. Lia Foyer. Chief Complaint: Fever and shortness of breath. HPI: 77 year old male with history of CAD status post stenting, COPD, history of renal cell cancer, who had a recent cholecystectomy at [place] 3 days ago and was discharged yesterday home felt feverish and short of breath after he reached home. At home in accordance temperature 102F. Since patient was feeling unwell he came to the ER. In the ER CT angiogram was done of the chest to rule out PE which showed features concerning for pneumonia. Patient has been started on empiric antibiotics. Will be admitted for further management. Patient denies any chest pain. He has mild nausea but no vomiting. He has pain at the surgical sites of the abdomen. His abdomen is mildly distended which he states is chronic.  Past Medical History  Diagnosis Date  . Hypertension   . CVA (cerebral infarction) 2011    Right cerebral; total obstruction of the right ICA  . Coronary artery disease     S/p PCI 2011;  NSTEMI 12/12:  LHC/PCI 02/23/11: LAD 60% after the septal perforator, D1 occluded with distal collaterals, proximal RI 30-40%, AV circumflex stent patent with 60% stenosis after the stent, RCA 99%, EF 60-65%.  His RCA was treated with a bare-metal stent  . Tobacco abuse, in remission   . Renal carcinoma   . COPD (chronic obstructive pulmonary disease)     Past Surgical History  Procedure Date  . Hip relacement   . Brain surgery   . Kidney surgery   . Colon surgery     History reviewed. No pertinent family history. Social History:  reports that he quit smoking about 4 years ago. His smoking use included Cigarettes. He has never used smokeless tobacco. He reports that he drinks alcohol. He reports that he does not use illicit drugs.  Allergies:  Allergies  Allergen Reactions  . Bee Venom     unknown      (Not in a hospital admission)  Results for orders placed during the hospital encounter of 10/25/11 (from the past 48 hour(s))  TROPONIN I     Status: Normal   Collection Time   10/25/11  5:44 PM      Component Value Range Comment   Troponin I <0.30  <0.30 ng/mL   PRO B NATRIURETIC PEPTIDE     Status: Abnormal   Collection Time   10/25/11  5:44 PM      Component Value Range Comment   Pro B Natriuretic peptide (BNP) 524.6 (*) 0 - 125 pg/mL   CBC WITH DIFFERENTIAL     Status: Abnormal   Collection Time   10/25/11  5:45 PM      Component Value Range Comment   WBC 14.5 (*) 4.0 - 10.5 K/uL    RBC 3.91 (*) 4.22 - 5.81 MIL/uL    Hemoglobin 12.2 (*) 13.0 - 17.0 g/dL    HCT 36.2 (*) 39.0 - 52.0 %    MCV 92.6  78.0 - 100.0 fL    MCH 31.2  26.0 - 34.0 pg    MCHC 33.7  30.0 - 36.0 g/dL    RDW 12.7  11.5 - 15.5 %    Platelets 240  150 - 400 K/uL    Neutrophils Relative 85 (*) 43 - 77 %    Neutro Abs 12.4 (*) 1.7 -  7.7 K/uL    Lymphocytes Relative 6 (*) 12 - 46 %    Lymphs Abs 0.9  0.7 - 4.0 K/uL    Monocytes Relative 8  3 - 12 %    Monocytes Absolute 1.1 (*) 0.1 - 1.0 K/uL    Eosinophils Relative 1  0 - 5 %    Eosinophils Absolute 0.2  0.0 - 0.7 K/uL    Basophils Relative 0  0 - 1 %    Basophils Absolute 0.0  0.0 - 0.1 K/uL   PROTIME-INR     Status: Abnormal   Collection Time   10/25/11  5:45 PM      Component Value Range Comment   Prothrombin Time 17.2 (*) 11.6 - 15.2 seconds    INR 1.38  0.00 - 1.49   POCT I-STAT, CHEM 8     Status: Abnormal   Collection Time   10/25/11  6:16 PM      Component Value Range Comment   Sodium 138  135 - 145 mEq/L    Potassium 4.0  3.5 - 5.1 mEq/L    Chloride 105  96 - 112 mEq/L    BUN 13  6 - 23 mg/dL    Creatinine, Ser 1.20  0.50 - 1.35 mg/dL    Glucose, Bld 122 (*) 70 - 99 mg/dL    Calcium, Ion 1.20  1.13 - 1.30 mmol/L    TCO2 20  0 - 100 mmol/L    Hemoglobin 12.9 (*) 13.0 - 17.0 g/dL    HCT 38.0 (*) 39.0 - 52.0 %    Dg Chest 2  View  10/25/2011  *RADIOLOGY REPORT*  Clinical Data: Shortness of breath, chest pain, cough, congestion, history hypertension, COPD, smoking, stenting, renal cell carcinoma  CHEST - 2 VIEW  Comparison: 03/17/2011  Findings: Borderline enlargement of cardiac silhouette. Slight pulmonary vascular congestion. Tortuous aorta. Bibasilar atelectasis. Underlying emphysematous changes. Small bibasilar pleural effusions. Remaining lungs clear. Bones demineralized.  IMPRESSION: Borderline enlargement of cardiac silhouette. Emphysematous changes with small bibasilar effusions and atelectasis.   Original Report Authenticated By: Burnetta Sabin, M.D.    Ct Angio Chest Pe W/cm &/or Wo Cm  10/26/2011  *RADIOLOGY REPORT*  Clinical Data: Recent laparoscopic cholecystectomy with subsequent shortness of breath and chest pain.  Question pulmonary embolism.  CT ANGIOGRAPHY CHEST  Technique:  Multidetector CT imaging of the chest using the standard protocol during bolus administration of intravenous contrast. Multiplanar reconstructed images including MIPs were obtained and reviewed to evaluate the vascular anatomy.  Contrast: 18mL OMNIPAQUE IOHEXOL 350 MG/ML SOLN  Comparison: Chest CT 02/22/2011.  Findings: The pulmonary arteries are well opacified with contrast. There is no evidence of acute pulmonary embolism.  There is stable mild great vessel, aortic and coronary artery atherosclerosis.  No enlarged mediastinal or hilar lymph nodes are present.  There is trace pleural fluid bilaterally.  There is no pericardial effusion.  A small hiatal hernia appears stable.  Mild emphysematous changes are present.  There are increased dependent opacities in both lower lobes, primarily linear on the reformatted images.  Component in the right lower lobe is more confluent and could reflect pneumonia.  The visualized upper abdomen is unchanged with mild contour irregularity of the liver.  The previously noted liver lesions are not seen on this  examination.  IMPRESSION:  1.  No evidence of acute pulmonary embolism. 2.  Increased dependent opacities at both lung bases may reflect atelectasis.  There is a more confluent component in the  right lower lobe which could reflect pneumonia or aspiration.  Trace pleural fluid is present bilaterally. 3.  Stable atherosclerosis.   Original Report Authenticated By: Vivia Ewing, M.D.     Review of Systems  Constitutional: Positive for fever.  HENT: Negative.   Eyes: Negative.   Respiratory: Negative.   Cardiovascular: Negative.   Gastrointestinal: Positive for nausea.  Genitourinary: Negative.   Musculoskeletal: Negative.   Skin: Negative.   Neurological: Negative.   Endo/Heme/Allergies: Negative.   Psychiatric/Behavioral: Negative.     Blood pressure 174/86, pulse 87, temperature 99.2 F (37.3 C), temperature source Oral, resp. rate 23, SpO2 96.00%. Physical Exam  Constitutional: He is oriented to person, place, and time. He appears well-developed and well-nourished. No distress.  HENT:  Head: Normocephalic and atraumatic.  Right Ear: External ear normal.  Left Ear: External ear normal.  Nose: Nose normal.  Mouth/Throat: Oropharynx is clear and moist. No oropharyngeal exudate.  Eyes: Conjunctivae are normal. Pupils are equal, round, and reactive to light. Right eye exhibits no discharge. Left eye exhibits no discharge. No scleral icterus.  Neck: Normal range of motion. Neck supple.  Cardiovascular: Normal rate and regular rhythm.   Respiratory: Effort normal and breath sounds normal. No respiratory distress. He has no wheezes.  GI: Soft.       Scars and dressing from surgery. Mildly distended abdomen. Small ventral hernia.  Musculoskeletal: Normal range of motion. He exhibits no edema and no tenderness.  Neurological: He is alert and oriented to person, place, and time.       Moves all extremities.  Skin: Skin is warm and dry. He is not diaphoretic.     Assessment/Plan #1.  Health care associated pneumonia - patient will be placed on vancomycin cefepime and Levaquin. Follow closely clinically. #2. Recent cholecystectomy with mild nausea - patient's abdomen is mildly distended which patient states is chronic. I'm not able appreciate bowel sounds I will get a KUB. #3. CAD status post stenting - denies chest pain. But given the shortness of breath we will cycle cardiac markers. Continue antiplatelet agents. #4. COPD - presently not wheezing. #5. History of CVA. #6. History of renal cell cancer.  CODE STATUS - full code.  Anamari Galeas N. 10/26/2011, 1:42 AM

## 2011-10-26 NOTE — ED Notes (Signed)
Admitting MD at pts bedside.

## 2011-10-26 NOTE — ED Notes (Signed)
Pt c/o slight difficulty breathing. O2 stats at 96%. Placed pt on 2L [place]. Pt states that it has relieved his difficulty breathing. Will continue to monitor.

## 2011-10-26 NOTE — Progress Notes (Signed)
ANTIBIOTIC CONSULT NOTE - INITIAL  Pharmacy Consult for Vancocin/Maxipime/Levaquin Indication: rule out pneumonia  Allergies  Allergen Reactions  . Bee Venom     unknown    Patient Measurements: Height: 6\' 2"  (188 cm) Weight: 209 lb 7 oz (95 kg) IBW/kg (Calculated) : 82.2   Vital Signs: Temp: 99.2 F (37.3 C) (09/01 2038) Temp src: Oral (09/01 2038) BP: 164/72 mmHg (09/02 0130) Pulse Rate: 94  (09/02 0130)  Labs:  Basename 10/25/11 1816 10/25/11 1745  WBC -- 14.5*  HGB 12.9* 12.2*  PLT -- 240  LABCREA -- --  CREATININE 1.20 --   Estimated Creatinine Clearance: 62.8 ml/min (by C-G formula based on Cr of 1.2).  Microbiology: No results found for this or any previous visit (from the past 720 hour(s)).  Medical History: Past Medical History  Diagnosis Date  . Hypertension   . CVA (cerebral infarction) 2011    Right cerebral; total obstruction of the right ICA  . Coronary artery disease     S/p PCI 2011;  NSTEMI 12/12:  LHC/PCI 02/23/11: LAD 60% after the septal perforator, D1 occluded with distal collaterals, proximal RI 30-40%, AV circumflex stent patent with 60% stenosis after the stent, RCA 99%, EF 60-65%.  His RCA was treated with a bare-metal stent  . Tobacco abuse, in remission   . Renal carcinoma   . COPD (chronic obstructive pulmonary disease)     Medications:  Prescriptions prior to admission  Medication Sig Dispense Refill  . albuterol-ipratropium (COMBIVENT) 18-103 MCG/ACT inhaler Inhale 4 puffs into the lungs daily. For shortness of breath      . ARTIFICIAL TEAR OP Place 1 drop into both eyes daily.      Marland Kitchen aspirin EC 81 MG tablet Take 81 mg by mouth daily.        . baclofen (LIORESAL) 10 MG tablet Take 10 mg by mouth 3 (three) times daily.      . cholecalciferol (VITAMIN D) 400 UNITS TABS Take 2,000 Units by mouth daily.        . clopidogrel (PLAVIX) 75 MG tablet Take 1 tablet (75 mg total) by mouth daily with breakfast.  30 tablet  3  .  cyanocobalamin 500 MCG tablet Take 500 mcg by mouth daily.      . enalapril (VASOTEC) 20 MG tablet Take 20 mg by mouth daily.        Marland Kitchen EPINEPHrine (EPI-PEN) 0.3 mg/0.3 mL DEVI Inject 0.3 mg into the muscle once.      . Flaxseed, Linseed, (FLAXSEED OIL) 1000 MG CAPS Take 1,000 mg by mouth 2 (two) times daily.       Marland Kitchen HYDROcodone-acetaminophen (NORCO/VICODIN) 5-325 MG per tablet Take 1 tablet by mouth 2 (two) times daily.      . hydroxypropyl methylcellulose (ISOPTO TEARS) 2.5 % ophthalmic solution Place 1 drop into both eyes daily.       . metoprolol tartrate (LOPRESSOR) 12.5 mg TABS Take 0.5 tablets (12.5 mg total) by mouth 2 (two) times daily.  60 tablet  60  . mometasone (ASMANEX) 220 MCG/INH inhaler Inhale 1 puff into the lungs at bedtime.        . Multiple Vitamin (MULTIVITAMIN) tablet Take 1 tablet by mouth daily.      . nitroGLYCERIN (NITROSTAT) 0.4 MG SL tablet Place 1 tablet (0.4 mg total) under the tongue every 5 (five) minutes as needed for chest pain.  25 tablet  3  . omega-3 acid ethyl esters (LOVAZA) 1 G capsule Take 1  g by mouth 2 (two) times daily.        . pantoprazole (PROTONIX) 40 MG tablet Take 40 mg by mouth daily.       . pravastatin (PRAVACHOL) 40 MG tablet Take 40 mg by mouth at bedtime.       Scheduled:    . albuterol-ipratropium  4 puff Inhalation Daily  . aspirin EC  81 mg Oral Daily  . baclofen  10 mg Oral TID  . ceFEPime (MAXIPIME) IV  1 g Intravenous Q12H  . cholecalciferol  2,000 Units Oral Daily  . clopidogrel  75 mg Oral Q breakfast  . cyanocobalamin  500 mcg Oral Daily  . enalapril  20 mg Oral Daily  . enoxaparin (LOVENOX) injection  40 mg Subcutaneous Q24H  . fluticasone  2 puff Inhalation BID  . HYDROcodone-acetaminophen  1 tablet Oral BID  . levofloxacin (LEVAQUIN) IV  750 mg Intravenous Q24H  . metoprolol tartrate  12.5 mg Oral BID  . multivitamin with minerals  1 tablet Oral Daily  . omega-3 acid ethyl esters  1 g Oral BID WC  . pantoprazole  40 mg  Oral Daily  . piperacillin-tazobactam  3.375 g Intravenous Once  . polyvinyl alcohol  1 drop Both Eyes Daily  . simvastatin  20 mg Oral q1800  . sodium chloride  3 mL Intravenous Q12H  . sodium chloride  3 mL Intravenous Q12H  . vancomycin  1,250 mg Intravenous Q12H  . vancomycin  1,500 mg Intravenous Once    Assessment: 77yo male with cholecystectomy 3d ago c/o fever and SOB with CT concerning for PNA to begin IV ABX.  Goal of Therapy:  Vancomycin trough level 15-20 mcg/ml  Plan:  Rec'd vanc 1500mg  and Zosyn 3.375g IV in ED; will continue with vancomycin 1250mg  IV Q12H and add cefepime 1g IV q12H and Levaquin 750mg  IV Q24H and monitor CBC, Cx, levels prn.  [place] Bussing PharmD BCPS 10/26/2011,3:04 AM

## 2011-10-26 NOTE — ED Provider Notes (Signed)
I saw and evaluated the patient, reviewed the resident's note and I agree with the findings and plan.  Perlie Mayo, MD 10/26/11 [phone]

## 2011-10-26 NOTE — Plan of Care (Signed)
Problem: Consults Goal: Diabetes Guidelines if Diabetic/Glucose > 140 If diabetic or lab glucose is > 140 mg/dl - Initiate Diabetes/Hyperglycemia Guidelines & Document Interventions  Outcome: Not Applicable Date Met:  123456 Pt is not diabetic

## 2011-10-26 NOTE — Progress Notes (Signed)
Patient seen and examined. Admitted earlier today with complaints of fever and SOB. On Friday had a cholecystectomy at the [facility]. Was found to have a PNA on chest CT. Is on vanc/cefepime/levaquin for treatment of HCAP given his recent hospital stay. Continue present treatment for now. Titrate down oxygen as tolerated. Will continue to follow.  Domingo Mend, MD [facility] Pager: [phone]

## 2011-10-27 DIAGNOSIS — R0602 Shortness of breath: Secondary | ICD-10-CM

## 2011-10-27 DIAGNOSIS — I679 Cerebrovascular disease, unspecified: Secondary | ICD-10-CM

## 2011-10-27 DIAGNOSIS — I4891 Unspecified atrial fibrillation: Secondary | ICD-10-CM

## 2011-10-27 LAB — BASIC METABOLIC PANEL
Calcium: 9.3 mg/dL (ref 8.4–10.5)
GFR calc Af Amer: 82 mL/min — ABNORMAL LOW (ref >90)
GFR calc non Af Amer: 71 mL/min — ABNORMAL LOW (ref >90)
Glucose, Bld: 110 mg/dL — ABNORMAL HIGH (ref 70–99)
Potassium: 3.9 mEq/L (ref 3.5–5.1)
Sodium: 137 mEq/L (ref 135–145)

## 2011-10-27 LAB — CBC
Hemoglobin: 11.3 g/dL — ABNORMAL LOW (ref 13.0–17.0)
Platelets: 263 10*3/uL (ref 150–400)
RBC: 3.58 MIL/uL — ABNORMAL LOW (ref 4.22–5.81)
WBC: 9.3 10*3/uL (ref 4.0–10.5)

## 2011-10-27 NOTE — Plan of Care (Signed)
Problem: Phase II Progression Outcomes Goal: Wean O2 if indicated Outcome: Progressing Pt started on 1L last night & is currently at this moment 0630 on 10/27/2011 having unlabored breathing with adequate Sats without any oxygen.

## 2011-10-27 NOTE — Progress Notes (Signed)
[facility]             Progress Note   Subjective: Feels better. Is not requiring oxygen.  Objective: Vital signs in last 24 hours: Temp:  [98.3 F (36.8 C)-99.5 F (37.5 C)] 99.3 F (37.4 C) (09/03 0500) Pulse Rate:  [77-84] 77  (09/03 0500) Resp:  [20] 20  (09/02 1400) BP: (155-179)/(68-80) 157/72 mmHg (09/03 0710) SpO2:  [93 %-98 %] 94 % (09/03 0825) Weight change:  Last BM Date: 10/26/11  Intake/Output from previous day: 09/02 0701 - 09/03 0700 In: 3 [I.V.:3] Out: -      Physical Exam: General: Alert, awake, oriented x3, in no acute distress. HEENT: No bruits, no goiter. Heart: Regular rate and rhythm, without murmurs, rubs, gallops. Lungs: Clear to auscultation bilaterally. Abdomen: Soft, nontender, nondistended, positive bowel sounds. Extremities: No clubbing cyanosis or edema with positive pedal pulses. Neuro: Grossly intact, nonfocal.    Lab Results: Basic Metabolic Panel:  Basename 10/27/11 0500 10/26/11 0324  NA 137 136  K 3.9 3.7  CL 106 105  CO2 24 21  GLUCOSE 110* 114*  BUN 9 11  CREATININE 1.01 1.00  CALCIUM 9.3 9.0  MG -- --  PHOS -- --   Liver Function Tests:  [facility] 10/26/11 0324  AST 20  ALT 20  ALKPHOS 84  BILITOT 1.1  PROT 6.5  ALBUMIN 2.9*   CBC:  Basename 10/27/11 0500 10/26/11 0324 10/25/11 1745  WBC 9.3 12.4* --  NEUTROABS -- 10.2* 12.4*  HGB 11.3* 11.2* --  HCT 33.4* 33.0* --  MCV 93.3 91.4 --  PLT 263 218 --   Cardiac Enzymes:  Basename 10/26/11 1519 10/26/11 0911 10/26/11 0324  CKTOTAL -- -- --  CKMB -- -- --  CKMBINDEX -- -- --  TROPONINI <0.30 <0.30 <0.30   BNP:  Basename 10/25/11 1744  PROBNP 524.6*   Thyroid Function Tests:  Basename 10/26/11 0324  TSH 1.768  T4TOTAL --  FREET4 --  T3FREE --  THYROIDAB --   Coagulation:  Basename 10/25/11 1745  LABPROT 17.2*  INR 1.38    Studies/Results: Dg Chest 2 View  10/25/2011  *RADIOLOGY REPORT*  Clinical Data: Shortness of  breath, chest pain, cough, congestion, history hypertension, COPD, smoking, stenting, renal cell carcinoma  CHEST - 2 VIEW  Comparison: 03/17/2011  Findings: Borderline enlargement of cardiac silhouette. Slight pulmonary vascular congestion. Tortuous aorta. Bibasilar atelectasis. Underlying emphysematous changes. Small bibasilar pleural effusions. Remaining lungs clear. Bones demineralized.  IMPRESSION: Borderline enlargement of cardiac silhouette. Emphysematous changes with small bibasilar effusions and atelectasis.   Original Report Authenticated By: Burnetta Sabin, M.D.    Dg Abd 1 View  10/26/2011  *RADIOLOGY REPORT*  Clinical Data: Nausea, right side abdominal pain, gallbladder surgery 3 days ago  ABDOMEN - 1 VIEW  Comparison: 08/04/2006  Findings: Surgical clips right upper quadrant consistent with history cholecystectomy. Prior L2-S1 lumbar fusion. Prior right hip replacement. Excreted contrast material within the urinary bladder. Question retained contrast within bowel loops in right abdomen. Dilated small bowel loops are seen with slight distention of the colon as well, question postoperative ileus. Stool noted in left colon. No definite bowel wall thickening identified. Atelectasis at bilateral lung bases. Osseous demineralization.  IMPRESSION: Question postoperative ileus. Bibasilar atelectasis.   Original Report Authenticated By: Burnetta Sabin, M.D.    Ct Angio Chest Pe W/cm &/or Wo Cm  10/26/2011  *RADIOLOGY REPORT*  Clinical Data: Recent laparoscopic cholecystectomy with subsequent shortness of breath and chest pain.  Question pulmonary embolism.  CT ANGIOGRAPHY CHEST  Technique:  Multidetector CT imaging of the chest using the standard protocol during bolus administration of intravenous contrast. Multiplanar reconstructed images including MIPs were obtained and reviewed to evaluate the vascular anatomy.  Contrast: 154mL OMNIPAQUE IOHEXOL 350 MG/ML SOLN  Comparison: Chest CT 02/22/2011.  Findings:  The pulmonary arteries are well opacified with contrast. There is no evidence of acute pulmonary embolism.  There is stable mild great vessel, aortic and coronary artery atherosclerosis.  No enlarged mediastinal or hilar lymph nodes are present.  There is trace pleural fluid bilaterally.  There is no pericardial effusion.  A small hiatal hernia appears stable.  Mild emphysematous changes are present.  There are increased dependent opacities in both lower lobes, primarily linear on the reformatted images.  Component in the right lower lobe is more confluent and could reflect pneumonia.  The visualized upper abdomen is unchanged with mild contour irregularity of the liver.  The previously noted liver lesions are not seen on this examination.  IMPRESSION:  1.  No evidence of acute pulmonary embolism. 2.  Increased dependent opacities at both lung bases may reflect atelectasis.  There is a more confluent component in the right lower lobe which could reflect pneumonia or aspiration.  Trace pleural fluid is present bilaterally. 3.  Stable atherosclerosis.   Original Report Authenticated By: Vivia Ewing, M.D.     Medications: Scheduled Meds:   . albuterol-ipratropium  2 puff Inhalation BID  . aspirin EC  81 mg Oral Daily  . baclofen  10 mg Oral TID  . ceFEPime (MAXIPIME) IV  1 g Intravenous Q12H  . cholecalciferol  2,000 Units Oral Daily  . clopidogrel  75 mg Oral Q breakfast  . cyanocobalamin  500 mcg Oral Daily  . enalapril  20 mg Oral Daily  . enoxaparin (LOVENOX) injection  40 mg Subcutaneous Q24H  . fluticasone  2 puff Inhalation BID  . levofloxacin (LEVAQUIN) IV  750 mg Intravenous Q24H  . metoprolol tartrate  12.5 mg Oral BID  . multivitamin with minerals  1 tablet Oral Daily  . omega-3 acid ethyl esters  1 g Oral BID WC  . pantoprazole  40 mg Oral Daily  . polyvinyl alcohol  1 drop Both Eyes Daily  . simvastatin  20 mg Oral q1800  . sodium chloride  3 mL Intravenous Q12H  . sodium  chloride  3 mL Intravenous Q12H  . vancomycin  1,250 mg Intravenous Q12H  . DISCONTD: albuterol-ipratropium  4 puff Inhalation Daily  . DISCONTD: HYDROcodone-acetaminophen  1 tablet Oral BID   Continuous Infusions:  PRN Meds:.acetaminophen, acetaminophen, albuterol, albuterol-ipratropium, HYDROcodone-acetaminophen, nitroGLYCERIN, ondansetron (ZOFRAN) IV, ondansetron  Assessment/Plan:  Principal Problem:  *Healthcare-associated pneumonia Active Problems:  Coronary atherosclerosis of native coronary artery  COPD (chronic obstructive pulmonary disease)  Hypertension   #1 HCAP: Seems to be clinically improving. Continue broad spectrum antibiotics today. Consider narrowing to PO coverage if afebrile and leukocytosis continues to improve.  #2 Dispo: perform ambulatory sats. Potential DC home in 1-2 days.  Rest of chronic medical issues have been stable so far.   LOS: 2 days   [facility] Pager: [phone] 10/27/2011, 9:00 AM

## 2011-10-27 NOTE — Progress Notes (Signed)
Pt O2 sats at rest 93%  on room air.  Pt ambulated 300 ft, with O2 saturation between 88-91%  on room air.  Pt oxygen level did not drop below 88% while ambulating.  Pt experienced some mild dyspnea on exertion.  Pt returned to room and O2 saturation returned to 93% on room air at rest.  Shanda Bumps

## 2011-10-28 DIAGNOSIS — I10 Essential (primary) hypertension: Secondary | ICD-10-CM

## 2011-10-28 LAB — CBC
HCT: 32.9 % — ABNORMAL LOW (ref 39.0–52.0)
Hemoglobin: 11.1 g/dL — ABNORMAL LOW (ref 13.0–17.0)
MCV: 92.2 fL (ref 78.0–100.0)
RBC: 3.57 MIL/uL — ABNORMAL LOW (ref 4.22–5.81)
WBC: 8.3 10*3/uL (ref 4.0–10.5)

## 2011-10-28 MED ORDER — HYDRALAZINE HCL 20 MG/ML IJ SOLN
5.0000 mg | Freq: Once | INTRAMUSCULAR | Status: AC
  Administered 2011-10-28: 5 mg via INTRAVENOUS
  Filled 2011-10-28: qty 1

## 2011-10-28 MED ORDER — LEVOFLOXACIN 750 MG PO TABS: 750.0000 mg | ORAL_TABLET | Freq: Every day | ORAL | Status: AC

## 2011-10-28 MED ORDER — POLYETHYLENE GLYCOL 3350 17 G PO PACK
17.0000 g | PACK | Freq: Every day | ORAL | Status: DC
  Administered 2011-10-28: 17 g via ORAL
  Filled 2011-10-28: qty 1

## 2011-10-28 MED ORDER — HYDROCODONE-ACETAMINOPHEN 5-325 MG PO TABS: 1.0000 | ORAL_TABLET | Freq: Two times a day (BID) | ORAL | Status: DC

## 2011-10-28 NOTE — Discharge Summary (Signed)
Physician Discharge Summary  MIAN KOLM [id number] DOB: November 09, 1934 DOA: 10/25/2011  PCP: Provider Not In System  Admit date: 10/25/2011 Discharge date: 10/28/2011  Recommendations for Outpatient Follow-up:  1. Patient will be followed by home health nurse to ensure continued improvement. They will be checking oxygen saturation levels.  Discharge Diagnoses:  Principal Problem:  *Healthcare-associated pneumonia Active Problems:  Coronary atherosclerosis of native coronary artery  COPD (chronic obstructive pulmonary disease)  Hypertension   Discharge Condition: Improved, being discharged home  Diet recommendation: Heart healthy  Filed Weights   10/26/11 0300  Weight: 95 kg (209 lb 7 oz)    History of present illness:  Patient is a 77 year old white male past medical history of CAD and COPD who 3 days prior to admission underwent a cholecystectomy at the [place] and presented to the emergency room on 10/26/11 with complaints of fever and shortness of breath. Pulmonary embolus was ruled out, a chest x-ray noted pneumonia. Patient was admitted and treated for healthcare associated pneumonia given his recent surgery/hospitalization.  Hospital Course:  Healthcare associated pneumonia: Patient was put on broad-spectrum antibiotics for several days. By hospital day 3, his white blood cell count had normalized and with ambulation his oxygen saturation stayed at 90%. At this point his antibiotics were changed over to by mouth and he is being discharged home with 7 more days of antibiotics to complete a 10 day course.  Status post recent cholecystectomy: Patient will follow up at the [facility] accordingly. Continue medication for pain.  CAD: Stable. COPD: Stable.   Procedures:  None  Consultations:  None  Discharge Exam: Filed Vitals:   10/28/11 1530  BP: 161/74  Pulse: 72  Temp:   Resp:    Filed Vitals:   10/28/11 1033 10/28/11 1335 10/28/11 1435 10/28/11 1530  BP: 188/74 184/62   161/74  Pulse: 76 70  72  Temp:  98.9 F (37.2 C)    TempSrc:      Resp:  22    Height:      Weight:      SpO2:  91% 94%     General: Alert and oriented x3, no acute distress, fatigue, looks about stated age  Cardiovascular: Regular rate and rhythm, S1-S2  Respiratory: Clear to auscultation bilaterally  Abdomen soft, nontender, nondistended, positive bowel sounds  Discharge Instructions  Discharge Orders    Future Orders Please Complete By Expires   Diet - low sodium heart healthy      Increase activity slowly        Medication List  As of 10/28/2011  3:37 PM   TAKE these medications         albuterol-ipratropium 18-103 MCG/ACT inhaler   Commonly known as: COMBIVENT   Inhale 4 puffs into the lungs daily. For shortness of breath      ARTIFICIAL TEAR OP   Place 1 drop into both eyes daily.      aspirin EC 81 MG tablet   Take 81 mg by mouth daily.      baclofen 10 MG tablet   Commonly known as: LIORESAL   Take 10 mg by mouth 3 (three) times daily.      cholecalciferol 400 UNITS Tabs   Commonly known as: VITAMIN D   Take 2,000 Units by mouth daily.      clopidogrel 75 MG tablet   Commonly known as: PLAVIX   Take 1 tablet (75 mg total) by mouth daily with breakfast.      cyanocobalamin 500  MCG tablet   Take 500 mcg by mouth daily.      enalapril 20 MG tablet   Commonly known as: VASOTEC   Take 20 mg by mouth daily.      EPINEPHrine 0.3 mg/0.3 mL Devi   Commonly known as: EPI-PEN   Inject 0.3 mg into the muscle once.      Flaxseed Oil 1000 MG Caps   Take 1,000 mg by mouth 2 (two) times daily.      HYDROcodone-acetaminophen 5-325 MG per tablet   Commonly known as: NORCO/VICODIN   Take 1 tablet by mouth 2 (two) times daily.      hydroxypropyl methylcellulose 2.5 % ophthalmic solution   Commonly known as: ISOPTO TEARS   Place 1 drop into both eyes daily.      levofloxacin 750 MG tablet   Commonly known as: LEVAQUIN   Take 1 tablet (750 mg total) by mouth  daily.      metoprolol tartrate 12.5 mg Tabs   Commonly known as: LOPRESSOR   Take 0.5 tablets (12.5 mg total) by mouth 2 (two) times daily.      mometasone 220 MCG/INH inhaler   Commonly known as: ASMANEX   Inhale 1 puff into the lungs at bedtime.      multivitamin tablet   Take 1 tablet by mouth daily.      nitroGLYCERIN 0.4 MG SL tablet   Commonly known as: NITROSTAT   Place 1 tablet (0.4 mg total) under the tongue every 5 (five) minutes as needed for chest pain.      omega-3 acid ethyl esters 1 G capsule   Commonly known as: LOVAZA   Take 1 g by mouth 2 (two) times daily.      pantoprazole 40 MG tablet   Commonly known as: PROTONIX   Take 40 mg by mouth daily.      pravastatin 40 MG tablet   Commonly known as: PRAVACHOL   Take 40 mg by mouth at bedtime.           Follow-up Information    Follow up with Primary doctor at the [facility] in 2 weeks.          The results of significant diagnostics from this hospitalization (including imaging, microbiology, ancillary and laboratory) are listed below for reference.    Significant Diagnostic Studies: Dg Chest 2 View  10/25/2011  IMPRESSION: Borderline enlargement of cardiac silhouette. Emphysematous changes with small bibasilar effusions and atelectasis.   Original Report Authenticated By: Burnetta Sabin, M.D.    Dg Abd 1 View  10/26/2011    IMPRESSION: Question postoperative ileus. Bibasilar atelectasis.   Original Report Authenticated By: Burnetta Sabin, M.D.    Ct Angio Chest Pe W/cm &/or Wo Cm  10/26/2011    IMPRESSION:  1.  No evidence of acute pulmonary embolism. 2.  Increased dependent opacities at both lung bases may reflect atelectasis.  There is a more confluent component in the right lower lobe which could reflect pneumonia or aspiration.  Trace pleural fluid is present bilaterally. 3.  Stable atherosclerosis.   Original Report Authenticated By: Vivia Ewing, M.D.     Microbiology: No results found for this or  any previous visit (from the past 240 hour(s)).   Labs: Basic Metabolic Panel:  Lab [id number] 0500 10/26/11 0324 10/25/11 1816  NA 137 136 138  K 3.9 3.7 4.0  CL 106 105 105  CO2 24 21 --  GLUCOSE 110* 114* 122*  BUN 9  11 13  CREATININE 1.01 1.00 1.20  CALCIUM 9.3 9.0 --  MG -- -- --  PHOS -- -- --   Liver Function Tests:  Lab 10/26/11 0324  AST 20  ALT 20  ALKPHOS 84  BILITOT 1.1  PROT 6.5  ALBUMIN 2.9*   CBC:  Lab 10/28/11 0940 10/27/11 0500 10/26/11 0324 10/25/11 1816 10/25/11 1745  WBC 8.3 9.3 12.4* -- 14.5*  NEUTROABS -- -- 10.2* -- 12.4*  HGB 11.1* 11.3* 11.2* 12.9* 12.2*  HCT 32.9* 33.4* 33.0* 38.0* 36.2*  MCV 92.2 93.3 91.4 -- 92.6  PLT 294 263 218 -- 240   Cardiac Enzymes:  Lab 10/26/11 1519 10/26/11 0911 10/26/11 0324 10/25/11 1744  CKTOTAL -- -- -- --  CKMB -- -- -- --  CKMBINDEX -- -- -- --  TROPONINI <0.30 <0.30 <0.30 <0.30   BNP: BNP (last 3 results)  Basename 10/25/11 1744  PROBNP 524.6*    Time coordinating discharge: 40  minutes  Signed:  Annita Brod  [facility] 10/28/2011, 3:37 PM

## 2011-10-28 NOTE — Progress Notes (Signed)
UR Completed Susi Goslin Graves-Bigelow, RN,BSN [phone]  

## 2011-10-28 NOTE — Care Management Note (Signed)
    Page 1 of 1   10/28/2011     12:34:21 PM   CARE MANAGEMENT NOTE 10/28/2011  Patient:  Wayne Meadows, Wayne Meadows   Account Number:  [id number]  Date Initiated:  10/28/2011  Documentation initiated by:  GRAVES-BIGELOW,Coltan Spinello  Subjective/Objective Assessment:   Pt admitted with HCAP. Pt is from home alone and plan is for d/c today. Pt ambulated and sats remained wnl today. RN will f/u from [facility] for disease managemet and will f/u with sats if need for 02 PCP will be notified and o2 will be obtained.     Action/Plan:   Referral was made to [facility] for services. SOC to begin within 24-48 hours post d/c. No further needs from CM at this time.   Anticipated DC Date:  10/28/2011   Anticipated DC Plan:  [place]  CM consult      [facility] Choice  HOME HEALTH   Choice offered to / List presented to:  C-1 Patient        [place] arranged  HH-1 RN  [place].   Status of service:  Completed, signed off Medicare Important Message given?   (If response is "NO", the following Medicare IM given date fields will be blank) Date Medicare IM given:   Date Additional Medicare IM given:    Discharge Disposition:  [place]  Per UR Regulation:  Reviewed for med. necessity/level of care/duration of stay  If discussed at [place] of Stay Meetings, dates discussed:    Comments:

## 2011-10-28 NOTE — Clinical Documentation Improvement (Signed)
GENERIC DOCUMENTATION CLARIFICATION QUERY  THIS DOCUMENT IS NOT A PERMANENT PART OF THE MEDICAL RECORD  TO RESPOND TO THE THIS QUERY, FOLLOW THE INSTRUCTIONS BELOW:  1. If needed, update documentation for the patient's encounter via the notes activity.  2. Access this query again and click edit on the [facility] [place].  3. After updating, or not, click F2 to complete all highlighted (required) fields concerning your review. Select "additional documentation in the medical record" OR "no additional documentation provided".  4. Click Sign note button.  5. The deficiency will fall out of your In Basket *Please let us know if you are not able to complete this workflow by phone or e-mail (listed below).  Please update your documentation within the medical record to reflect your response to this query.                                                                                        10/28/11   Dear Dr. Isaac Bliss  / Associates,  In a better effort to capture your patient's severity of illness/SOI, risk of mortality/ROM, reflect appropriate length of stay and utilization of resources, a review of the patient medical record has revealed the following indicators.    PER XR 9/2: QUESTIONABLE POSTOPERATIVE ILEUS. BIBASILAR ATELECTASIS. PLEASE ADDRESS IN NOTES AND DC SUMMARY. THANK YOU.  Possible Clinical Conditions? -  Postop ileus, mild -  Other condition -  Unable to determine   Supporting Information: -  Risk Factors: Pt had cholecystectomy 3 days ago at [place]. Admitted with HCAP. -  Signs/Symptoms: Distended abdomen on PE. Nausea. SOB. -  Radiology: 9/2: Question retained contrast within bowel loops in right abdomen. Dilated small bowel loops are seen with slight distention of the colon as well, question postoperative ileus.  Also 9/2: Atelectasis at bilateral lung bases.  -  Treatment: 9/4 Miralax, Up with assistance, Pulse Ox, O2   You may use possible, probable, or suspect with  inpatient documentation. possible, probable, suspected diagnoses MUST be documented at the time of discharge  Reviewed:  no additional documentation provided  Thank You,  Ezekiel Ina RN Clinical Documentation Specialist: Pager:  [place] Management: [place]

## 2013-07-13 ENCOUNTER — Emergency Department (HOSPITAL_COMMUNITY)
Admission: EM | Admit: 2013-07-13 | Discharge: 2013-07-13 | Disposition: A | Discharge status: Home / Self Care | Payer: Medicare Other | Attending: Emergency Medicine | Admitting: Emergency Medicine

## 2013-07-13 ENCOUNTER — Encounter (HOSPITAL_COMMUNITY): Payer: Self-pay | Admitting: Emergency Medicine

## 2013-07-13 ENCOUNTER — Emergency Department (HOSPITAL_COMMUNITY): Payer: Medicare Other

## 2013-07-13 DIAGNOSIS — Z79899 Other long term (current) drug therapy: Secondary | ICD-10-CM | POA: Insufficient documentation

## 2013-07-13 DIAGNOSIS — K029 Dental caries, unspecified: Secondary | ICD-10-CM | POA: Insufficient documentation

## 2013-07-13 DIAGNOSIS — R599 Enlarged lymph nodes, unspecified: Secondary | ICD-10-CM | POA: Insufficient documentation

## 2013-07-13 DIAGNOSIS — I10 Essential (primary) hypertension: Secondary | ICD-10-CM | POA: Insufficient documentation

## 2013-07-13 DIAGNOSIS — Z85528 Personal history of other malignant neoplasm of kidney: Secondary | ICD-10-CM | POA: Insufficient documentation

## 2013-07-13 DIAGNOSIS — J441 Chronic obstructive pulmonary disease with (acute) exacerbation: Secondary | ICD-10-CM | POA: Insufficient documentation

## 2013-07-13 DIAGNOSIS — K089 Disorder of teeth and supporting structures, unspecified: Secondary | ICD-10-CM | POA: Insufficient documentation

## 2013-07-13 DIAGNOSIS — I2510 Atherosclerotic heart disease of native coronary artery without angina pectoris: Secondary | ICD-10-CM | POA: Insufficient documentation

## 2013-07-13 DIAGNOSIS — Z7982 Long term (current) use of aspirin: Secondary | ICD-10-CM | POA: Insufficient documentation

## 2013-07-13 DIAGNOSIS — Z87891 Personal history of nicotine dependence: Secondary | ICD-10-CM | POA: Insufficient documentation

## 2013-07-13 DIAGNOSIS — Z8673 Personal history of transient ischemic attack (TIA), and cerebral infarction without residual deficits: Secondary | ICD-10-CM | POA: Insufficient documentation

## 2013-07-13 DIAGNOSIS — L03211 Cellulitis of face: Secondary | ICD-10-CM

## 2013-07-13 DIAGNOSIS — K002 Abnormalities of size and form of teeth: Secondary | ICD-10-CM | POA: Insufficient documentation

## 2013-07-13 DIAGNOSIS — L0201 Cutaneous abscess of face: Secondary | ICD-10-CM | POA: Insufficient documentation

## 2013-07-13 LAB — BASIC METABOLIC PANEL
BUN: 14 mg/dL (ref 6–23)
CHLORIDE: 102 meq/L (ref 96–112)
CO2: 23 mEq/L (ref 19–32)
CREATININE: 1.02 mg/dL (ref 0.50–1.35)
Calcium: 9.6 mg/dL (ref 8.4–10.5)
GFR calc non Af Amer: 69 mL/min — ABNORMAL LOW (ref >90)
GFR, EST AFRICAN AMERICAN: 80 mL/min — AB (ref >90)
Glucose, Bld: 102 mg/dL — ABNORMAL HIGH (ref 70–99)
Potassium: 3.8 mEq/L (ref 3.7–5.3)
Sodium: 141 mEq/L (ref 137–147)

## 2013-07-13 LAB — CBC
HCT: 40.1 % (ref 39.0–52.0)
Hemoglobin: 13.7 g/dL (ref 13.0–17.0)
MCH: 31.5 pg (ref 26.0–34.0)
MCHC: 34.2 g/dL (ref 30.0–36.0)
MCV: 92.2 fL (ref 78.0–100.0)
PLATELETS: 306 10*3/uL (ref 150–400)
RBC: 4.35 MIL/uL (ref 4.22–5.81)
RDW: 12.5 % (ref 11.5–15.5)
WBC: 9.7 10*3/uL (ref 4.0–10.5)

## 2013-07-13 MED ORDER — CLINDAMYCIN HCL 300 MG PO CAPS
450.0000 mg | ORAL_CAPSULE | Freq: Once | ORAL | Status: AC
  Administered 2013-07-13: 450 mg via ORAL
  Filled 2013-07-13: qty 1

## 2013-07-13 MED ORDER — IOHEXOL 300 MG/ML  SOLN
100.0000 mL | Freq: Once | INTRAMUSCULAR | Status: AC | PRN
  Administered 2013-07-13: 100 mL via INTRAVENOUS

## 2013-07-13 MED ORDER — HYDROCODONE-ACETAMINOPHEN 5-325 MG PO TABS: 1.0000 | ORAL_TABLET | Freq: Four times a day (QID) | ORAL | Status: DC | PRN

## 2013-07-13 MED ORDER — CLINDAMYCIN HCL 150 MG PO CAPS: 450.0000 mg | ORAL_CAPSULE | Freq: Three times a day (TID) | ORAL | Status: DC

## 2013-07-13 NOTE — ED Notes (Signed)
Pt presents with c/o tongue, oral, and facial swelling that has been going on since Sunday. Pt says he has not been exposed to anything new over the past few days. Pt does have significant swelling to his jaw, chin, and inside of his mouth. Pt is is NAD at this time but says he has had some trouble breathing in the mornings after he wakes up. Pt has been unable to eat or drink much since Tuesday.

## 2013-07-13 NOTE — ED Notes (Signed)
Message sent to Pharmacy to have [place] sent to ED Will DC patient when med arrives

## 2013-07-13 NOTE — ED Provider Notes (Signed)
CSN: [id number]     Arrival date & time 07/13/13  1630 History   First MD Initiated Contact with Patient 07/13/13 1646     Chief Complaint  Patient presents with  . Oral Swelling  . Facial Swelling     (Consider location/radiation/quality/duration/timing/severity/associated sxs/prior Treatment) Patient is a 79 y.o. male presenting with tooth pain and general illness. The history is provided by the patient.  Dental Pain Associated symptoms: no fever   Illness Location:  Floor of mouth swelling, pain Quality:  Pain, swelling Severity:  Moderate Onset quality:  Gradual Duration:  5 days Timing:  Intermittent Progression:  Worsening Chronicity:  New Context:  Poor dentition in anterior lower incisors due to inhaled steroids Relieved by:  Nothing Worsened by:  Nothing Associated symptoms: shortness of breath (in the mornings)   Associated symptoms: no cough, no diarrhea, no fever, no loss of consciousness, no nausea, no rash and no vomiting     Past Medical History  Diagnosis Date  . Hypertension   . CVA (cerebral infarction) 2011    Right cerebral; total obstruction of the right ICA  . Coronary artery disease     S/p PCI 2011;  NSTEMI 12/12:  LHC/PCI 02/23/11: LAD 60% after the septal perforator, D1 occluded with distal collaterals, proximal RI 30-40%, AV circumflex stent patent with 60% stenosis after the stent, RCA 99%, EF 60-65%.  His RCA was treated with a bare-metal stent  . Tobacco abuse, in remission   . Renal carcinoma   . COPD (chronic obstructive pulmonary disease)    Past Surgical History  Procedure Laterality Date  . Hip relacement    . Kidney surgery    . Colon surgery     No family history on file. History  Substance Use Topics  . Smoking status: Former Smoker    Types: Cigarettes    Quit date: 02/24/2007  . Smokeless tobacco: Never Used  . Alcohol Use: Yes     Comment: socially     Review of Systems  Constitutional: Negative for fever.   Respiratory: Positive for shortness of breath (in the mornings). Negative for cough.   Gastrointestinal: Negative for nausea, vomiting and diarrhea.  Skin: Negative for rash.  Neurological: Negative for loss of consciousness.  All other systems reviewed and are negative.     Allergies  Bee venom  Home Medications   Prior to Admission medications   Medication Sig Start Date End Date Taking? Authorizing Provider  albuterol-ipratropium (COMBIVENT) 18-103 MCG/ACT inhaler Inhale 4 puffs into the lungs daily. For shortness of breath    Historical Provider, MD  ARTIFICIAL TEAR OP Place 1 drop into both eyes daily.    Historical Provider, MD  aspirin EC 81 MG tablet Take 81 mg by mouth daily.      Historical Provider, MD  baclofen (LIORESAL) 10 MG tablet Take 10 mg by mouth 3 (three) times daily.    Historical Provider, MD  cholecalciferol (VITAMIN D) 400 UNITS TABS Take 2,000 Units by mouth daily.      Historical Provider, MD  cyanocobalamin 500 MCG tablet Take 500 mcg by mouth daily.    Historical Provider, MD  enalapril (VASOTEC) 20 MG tablet Take 20 mg by mouth daily.      Historical Provider, MD  EPINEPHrine (EPI-PEN) 0.3 mg/0.3 mL DEVI Inject 0.3 mg into the muscle once.    Historical Provider, MD  Flaxseed, Linseed, (FLAXSEED OIL) 1000 MG CAPS Take 1,000 mg by mouth 2 (two) times daily.  Historical Provider, MD  HYDROcodone-acetaminophen (NORCO/VICODIN) 5-325 MG per tablet Take 1 tablet by mouth 2 (two) times daily. 10/28/11   Annita Brod, MD  hydroxypropyl methylcellulose (ISOPTO TEARS) 2.5 % ophthalmic solution Place 1 drop into both eyes daily.     Historical Provider, MD  metoprolol tartrate (LOPRESSOR) 12.5 mg TABS Take 0.5 tablets (12.5 mg total) by mouth 2 (two) times daily. 02/25/11   [name], PA-C  mometasone (ASMANEX) 220 MCG/INH inhaler Inhale 1 puff into the lungs at bedtime.      Historical Provider, MD  Multiple Vitamin (MULTIVITAMIN) tablet Take 1 tablet by  mouth daily.    Historical Provider, MD  nitroGLYCERIN (NITROSTAT) 0.4 MG SL tablet Place 1 tablet (0.4 mg total) under the tongue every 5 (five) minutes as needed for chest pain. 02/25/11 02/25/12  [name], PA-C  omega-3 acid ethyl esters (LOVAZA) 1 G capsule Take 1 g by mouth 2 (two) times daily.      Historical Provider, MD  pantoprazole (PROTONIX) 40 MG tablet Take 40 mg by mouth daily.  03/19/11 03/18/12  Jessica A Hope, PA-C  pravastatin (PRAVACHOL) 40 MG tablet Take 40 mg by mouth at bedtime.    Historical Provider, MD   BP 208/89  Pulse 97  Temp(Src) 99 F (37.2 C) (Oral)  Resp 20  SpO2 95% Physical Exam  Constitutional: He is oriented to person, place, and time. He appears well-developed and well-nourished. No distress.  HENT:  Head: Normocephalic and atraumatic.  Mouth/Throat: No oral lesions. No trismus in the jaw. Abnormal dentition. Dental caries (poor dentition, front 4 lower teeth with severe decay. No oral abscess) present. No dental abscesses or lacerations. No oropharyngeal exudate or posterior oropharyngeal edema.  Swelling on floor of mouth just behind front four teeth.  Eyes: EOM are normal. Pupils are equal, round, and reactive to light.  Neck: Normal range of motion. Neck supple. No tracheal deviation present. No thyromegaly present.  Cardiovascular: Normal rate and regular rhythm.  Exam reveals no friction rub.   No murmur heard. Pulmonary/Chest: Effort normal and breath sounds normal. No stridor. No respiratory distress. He has no wheezes. He has no rales.  Abdominal: He exhibits no distension. There is no tenderness. There is no rebound.  Musculoskeletal: Normal range of motion. He exhibits no edema.  Lymphadenopathy:    He has cervical adenopathy (L and R submandibular).  Neurological: He is alert and oriented to person, place, and time.  Skin: He is not diaphoretic.    ED Course  Procedures (including critical care time) Labs Review Labs Reviewed   BASIC METABOLIC PANEL - Abnormal; Notable for the following:    Glucose, Bld 102 (*)    GFR calc non Af Amer 69 (*)    GFR calc Af Amer 80 (*)    All other components within normal limits  CBC    Imaging Review Ct Soft Tissue Neck W Contrast  07/13/2013   CLINICAL DATA:  Floor of mouth swelling.  Question Ludwig's angina.  EXAM: CT NECK WITH CONTRAST  TECHNIQUE: Multidetector CT imaging of the neck was performed using the standard protocol following the bolus administration of intravenous contrast.  CONTRAST:  115mL OMNIPAQUE IOHEXOL 300 MG/ML  SOLN  COMPARISON:  No comparison neck CT. Plain film examination 09/16/2011. MR brain 08/03/2006.  FINDINGS: Superficial inflammation anterior to the mandible extending superficial to the platysma muscle greater on the right reaching the lower hyoid level which may represent cellulitis without drainable abscess. No  inflammation deep within the neck/ floor of mouth.  Prominent caries may contribute to the cellulitis.  Scattered normal-sized lymph nodes may be reactive in origin.  Chronically occluded right internal carotid artery. Atherosclerotic type changes left carotid bifurcation with narrowing which currently does not appear to be hemodynamically significant.  Cervical spondylotic changes most notable at C5-6.  Lung apical pleural thickening with calcification without associated bony destruction.  IMPRESSION: Superficial inflammation anterior to the mandible extending superficial to the platysma muscle greater on the right reaching the lower hyoid level which may represent cellulitis without drainable abscess. No inflammation deep within the neck/ floor of mouth.  Prominent caries may contribute to the cellulitis.  Scattered normal-sized lymph nodes may be reactive in origin.  Chronically occluded right internal carotid artery. Atherosclerotic type changes left carotid bifurcation with narrowing which currently does not appear to be hemodynamically  significant.  Lung apical pleural thickening with calcification without associated bony destruction.   Electronically Signed   By: Chauncey Cruel M.D.   On: 07/13/2013 18:49     EKG Interpretation None      MDM   Final diagnoses:  Cellulitis of chin    63M presents with facial pain, swelling for past 5 days. Began having facial pain 5 days ago, it went away for about 2 days, then it returned 2 days ago and has persisted. He began having some oral swelling also, underneath his tongue, at that time too. Persistent pain and swelling with mild progression of the swelling. On exam, no stridor, talking in complete sentences. No posterior oropharyngeal swelling. Floor of mouth swollen underneath tongue. Submental fullness on the R side. Anterior lower incisors all rotted away to gingiva, no identifiable oral abscess.  Patient has no wheezes, he is breathing well. No lip swelling. Concern for possible early Ludwig's angina. Will CT face. CT shows no evidence of Ludwig's. Chin cellulitis seen with nothing deep to the platysma. No deep abscess/cellulitis. Dr. Erik Obey with ENT states he should f/u with a dentist. Airway still clear, no stridor, resting comfortably. Pain meds, antibiotics prescribed. Stable for discharge. I have reviewed all labs and imaging and considered them in my medical decision making.   Osvaldo Shipper, MD 07/13/13 [id number]

## 2013-07-13 NOTE — Discharge Instructions (Signed)
Cellulitis Cellulitis is an infection of the skin and the tissue beneath it. The infected area is usually red and tender. Cellulitis occurs most often in the arms and lower legs.  CAUSES  Cellulitis is caused by bacteria that enter the skin through cracks or cuts in the skin. The most common types of bacteria that cause cellulitis are Staphylococcus and Streptococcus. SYMPTOMS   Redness and warmth.  Swelling.  Tenderness or pain.  Fever. DIAGNOSIS  Your caregiver can usually determine what is wrong based on a physical exam. Blood tests may also be done. TREATMENT  Treatment usually involves taking an antibiotic medicine. HOME CARE INSTRUCTIONS   Take your antibiotics as directed. Finish them even if you start to feel better.  Keep the infected arm or leg elevated to reduce swelling.  Apply a warm cloth to the affected area up to 4 times per day to relieve pain.  Only take over-the-counter or prescription medicines for pain, discomfort, or fever as directed by your caregiver.  Keep all follow-up appointments as directed by your caregiver. SEEK MEDICAL CARE IF:   You notice red streaks coming from the infected area.  Your red area gets larger or turns dark in color.  Your bone or joint underneath the infected area becomes painful after the skin has healed.  Your infection returns in the same area or another area.  You notice a swollen bump in the infected area.  You develop new symptoms. SEEK IMMEDIATE MEDICAL CARE IF:   You have a fever.  You feel very sleepy.  You develop vomiting or diarrhea.  You have a general ill feeling (malaise) with muscle aches and pains. MAKE SURE YOU:   Understand these instructions.  Will watch your condition.  Will get help right away if you are not doing well or get worse. Document Released: 11/19/2004 Document Revised: 08/11/2011 Document Reviewed: 04/27/2011 [employer] Patient Information 2014 [employer].  

## 2013-08-30 ENCOUNTER — Emergency Department (HOSPITAL_COMMUNITY)
Admission: EM | Admit: 2013-08-30 | Discharge: 2013-08-30 | Disposition: A | Discharge status: Home / Self Care | Payer: Medicare Other | Attending: Emergency Medicine | Admitting: Emergency Medicine

## 2013-08-30 ENCOUNTER — Encounter (HOSPITAL_COMMUNITY): Payer: Self-pay | Admitting: Emergency Medicine

## 2013-08-30 DIAGNOSIS — T63441A Toxic effect of venom of bees, accidental (unintentional), initial encounter: Secondary | ICD-10-CM

## 2013-08-30 DIAGNOSIS — I10 Essential (primary) hypertension: Secondary | ICD-10-CM | POA: Insufficient documentation

## 2013-08-30 DIAGNOSIS — Y9289 Other specified places as the place of occurrence of the external cause: Secondary | ICD-10-CM | POA: Insufficient documentation

## 2013-08-30 DIAGNOSIS — T63461A Toxic effect of venom of wasps, accidental (unintentional), initial encounter: Secondary | ICD-10-CM | POA: Insufficient documentation

## 2013-08-30 DIAGNOSIS — I2510 Atherosclerotic heart disease of native coronary artery without angina pectoris: Secondary | ICD-10-CM | POA: Insufficient documentation

## 2013-08-30 DIAGNOSIS — Z85528 Personal history of other malignant neoplasm of kidney: Secondary | ICD-10-CM | POA: Insufficient documentation

## 2013-08-30 DIAGNOSIS — IMO0002 Reserved for concepts with insufficient information to code with codable children: Secondary | ICD-10-CM | POA: Insufficient documentation

## 2013-08-30 DIAGNOSIS — J441 Chronic obstructive pulmonary disease with (acute) exacerbation: Secondary | ICD-10-CM | POA: Insufficient documentation

## 2013-08-30 DIAGNOSIS — Y9389 Activity, other specified: Secondary | ICD-10-CM | POA: Insufficient documentation

## 2013-08-30 DIAGNOSIS — Z87891 Personal history of nicotine dependence: Secondary | ICD-10-CM | POA: Insufficient documentation

## 2013-08-30 DIAGNOSIS — Z7982 Long term (current) use of aspirin: Secondary | ICD-10-CM | POA: Insufficient documentation

## 2013-08-30 DIAGNOSIS — T6391XA Toxic effect of contact with unspecified venomous animal, accidental (unintentional), initial encounter: Secondary | ICD-10-CM | POA: Insufficient documentation

## 2013-08-30 DIAGNOSIS — Z79899 Other long term (current) drug therapy: Secondary | ICD-10-CM | POA: Insufficient documentation

## 2013-08-30 MED ORDER — METHYLPREDNISOLONE SODIUM SUCC 125 MG IJ SOLR
125.0000 mg | Freq: Once | INTRAMUSCULAR | Status: AC
  Administered 2013-08-30: 125 mg via INTRAVENOUS
  Filled 2013-08-30: qty 2

## 2013-08-30 MED ORDER — FAMOTIDINE IN NACL 20-0.9 MG/50ML-% IV SOLN
20.0000 mg | Freq: Once | INTRAVENOUS | Status: AC
  Administered 2013-08-30: 20 mg via INTRAVENOUS
  Filled 2013-08-30: qty 50

## 2013-08-30 MED ORDER — PREDNISONE 20 MG PO TABS: 40.0000 mg | ORAL_TABLET | Freq: Every day | ORAL | Status: DC

## 2013-08-30 MED ORDER — DIPHENHYDRAMINE HCL 50 MG/ML IJ SOLN
25.0000 mg | Freq: Once | INTRAMUSCULAR | Status: AC
  Administered 2013-08-30: 25 mg via INTRAVENOUS
  Filled 2013-08-30: qty 1

## 2013-08-30 NOTE — Discharge Instructions (Signed)

## 2013-08-30 NOTE — ED Notes (Signed)
Pt reports bee sting and used EPI pin just before arrival.  Pt c/o of shortness of breath EDP Pickering evaluated pt upon arrival to ED

## 2013-08-30 NOTE — ED Provider Notes (Signed)
CSN: [id number]     Arrival date & time 08/30/13  1607 History   First MD Initiated Contact with Patient 08/30/13 1612     Chief Complaint  Patient presents with  . Allergic Reaction     (Consider location/radiation/quality/duration/timing/severity/associated sxs/prior Treatment) Patient is a 79 y.o. male presenting with allergic reaction. The history is provided by the patient.  Allergic Reaction Presenting symptoms: no rash    patient has a history of allergic reactions. He states his been stung twice before and had to come to the ER after giving himself an EpiPen. He states he was done on the left earlobe just prior to arrival. He gave himself a breathing treatment in EpiPen and came to the ER. He states he does feel mildly short of breath. No chest pain. He states he feels a swelling in his left face. No lightheadedness or dizziness. No fevers. No numbness weakness.  Past Medical History  Diagnosis Date  . Hypertension   . CVA (cerebral infarction) 2011    Right cerebral; total obstruction of the right ICA  . Coronary artery disease     S/p PCI 2011;  NSTEMI 12/12:  LHC/PCI 02/23/11: LAD 60% after the septal perforator, D1 occluded with distal collaterals, proximal RI 30-40%, AV circumflex stent patent with 60% stenosis after the stent, RCA 99%, EF 60-65%.  His RCA was treated with a bare-metal stent  . Tobacco abuse, in remission   . Renal carcinoma   . COPD (chronic obstructive pulmonary disease)    Past Surgical History  Procedure Laterality Date  . Hip relacement    . Kidney surgery    . Colon surgery     No family history on file. History  Substance Use Topics  . Smoking status: Former Smoker    Types: Cigarettes    Quit date: 02/24/2007  . Smokeless tobacco: Never Used  . Alcohol Use: Yes     Comment: socially     Review of Systems  Constitutional: Negative for activity change and appetite change.  Eyes: Negative for pain.  Respiratory: Positive for shortness of  breath. Negative for chest tightness.   Cardiovascular: Negative for chest pain and leg swelling.  Gastrointestinal: Negative for nausea, vomiting, abdominal pain and diarrhea.  Genitourinary: Negative for flank pain.  Musculoskeletal: Negative for back pain and neck stiffness.  Skin: Positive for color change. Negative for rash.  Neurological: Negative for weakness, numbness and headaches.  Psychiatric/Behavioral: Negative for behavioral problems.      Allergies  Influenza vaccines and Bee venom  Home Medications   Prior to Admission medications   Medication Sig Start Date End Date Taking? Authorizing Provider  amLODipine (NORVASC) 10 MG tablet Take 5 mg by mouth daily.   Yes Historical Provider, MD  ARTIFICIAL TEAR OP Place 1 drop into both eyes 4 (four) times daily.    Yes Historical Provider, MD  aspirin EC 81 MG tablet Take 81 mg by mouth daily.     Yes Historical Provider, MD  baclofen (LIORESAL) 10 MG tablet Take 10 mg by mouth at bedtime as needed (sleep).    Yes Historical Provider, MD  cyanocobalamin 500 MCG tablet Take 500 mcg by mouth daily.   Yes Historical Provider, MD  docusate sodium (COLACE) 100 MG capsule Take 100 mg by mouth 2 (two) times daily.   Yes Historical Provider, MD  enalapril (VASOTEC) 20 MG tablet Take 20 mg by mouth at bedtime.    Yes Historical Provider, MD  EPINEPHrine (EPI-PEN) 0.3  mg/0.3 mL DEVI Inject 0.3 mg into the muscle once.   Yes Historical Provider, MD  Flaxseed, Linseed, (FLAXSEED OIL) 1000 MG CAPS Take 1,000 mg by mouth 2 (two) times daily.    Yes Historical Provider, MD  HYDROcodone-acetaminophen (NORCO/VICODIN) 5-325 MG per tablet Take 1 tablet by mouth 2 (two) times daily as needed for moderate pain.   Yes Historical Provider, MD  Ipratropium-Albuterol (COMBIVENT) 20-100 MCG/ACT AERS respimat Inhale 1 puff into the lungs 4 (four) times daily.   Yes Historical Provider, MD  metoprolol tartrate (LOPRESSOR) 12.5 mg TABS Take 0.5 tablets (12.5  mg total) by mouth 2 (two) times daily. 02/25/11  Yes [name], PA-C  mometasone (ASMANEX) 220 MCG/INH inhaler Inhale 1 puff into the lungs at bedtime.     Yes Historical Provider, MD  Multiple Vitamin (MULTIVITAMIN) tablet Take 1 tablet by mouth daily.   Yes Historical Provider, MD  Omega-3 Fatty Acids (FISH OIL CONCENTRATE) 1000 MG CAPS Take 1,000 mg by mouth 2 (two) times daily.   Yes Historical Provider, MD  pantoprazole (PROTONIX) 40 MG tablet Take 40 mg by mouth 2 (two) times daily.   Yes Historical Provider, MD  pravastatin (PRAVACHOL) 40 MG tablet Take 40 mg by mouth at bedtime.   Yes Historical Provider, MD  ranitidine (ZANTAC) 300 MG tablet Take 300 mg by mouth at bedtime.   Yes Historical Provider, MD  nitroGLYCERIN (NITROSTAT) 0.4 MG SL tablet Place 1 tablet (0.4 mg total) under the tongue every 5 (five) minutes as needed for chest pain. 02/25/11 02/25/12  [name], PA-C  predniSONE (DELTASONE) 20 MG tablet Take 2 tablets (40 mg total) by mouth daily. 08/30/13   Jasper Riling. Osama Coleson, MD   BP 173/74  Pulse 64  Temp(Src) 98.3 F (36.8 C) (Oral)  Resp 14  SpO2 94% Physical Exam  Constitutional: He is oriented to person, place, and time. He appears well-developed and well-nourished.  HENT:  Head: Normocephalic.  Erythema and mild swelling to left ear. Mild tracking to left face. No fluctuance. No posterior pharyngeal edema. No stridor  Eyes: Pupils are equal, round, and reactive to light.  Cardiovascular: Normal rate and regular rhythm.   Pulmonary/Chest:  Mildly harsh breath sounds. No frank wheezing  Abdominal: Soft. Bowel sounds are normal.  Musculoskeletal: He exhibits no edema.  Neurological: He is alert and oriented to person, place, and time.  Skin: Skin is warm.    ED Course  Procedures (including critical care time) Labs Review Labs Reviewed - No data to display  Imaging Review No results found.   EKG Interpretation   Date/Time:  Wednesday August 30 2013 16:15:54 EDT Ventricular Rate:  91 PR Interval:  169 QRS Duration: 101 QT Interval:  379 QTC Calculation: 466 R Axis:   77 Text Interpretation:  Sinus rhythm Consider left atrial enlargement  Minimal ST depression, diffuse leads Confirmed by Alvino Chapel  MD, Ovid Curd  [phone]) on 08/30/2013 4:29:34 PM      MDM   Final diagnoses:  Allergic reaction to bee sting, accidental or unintentional, initial encounter    Patient is allergic reaction to bee sting. History of same. Patient states he still has EpiPen at home. Will give short dose of steroids. Patient has been stable for 3 and half hours. Will discharge home   Jasper Riling. Alvino Chapel, MD 08/30/13 [phone]

## 2014-01-31 ENCOUNTER — Encounter (HOSPITAL_COMMUNITY): Payer: Self-pay | Admitting: Cardiovascular Disease

## 2014-02-01 ENCOUNTER — Encounter (HOSPITAL_COMMUNITY): Payer: Self-pay | Admitting: Cardiology

## 2014-04-08 ENCOUNTER — Encounter (HOSPITAL_COMMUNITY): Payer: Self-pay | Admitting: Emergency Medicine

## 2014-04-08 ENCOUNTER — Emergency Department (HOSPITAL_COMMUNITY): Payer: Medicare Other

## 2014-04-08 ENCOUNTER — Inpatient Hospital Stay (HOSPITAL_COMMUNITY)
Admission: EM | Admit: 2014-04-08 | Discharge: 2014-04-09 | DRG: 195 | Disposition: A | Discharge status: Home / Self Care | Payer: Medicare Other | Attending: Internal Medicine | Admitting: Internal Medicine

## 2014-04-08 DIAGNOSIS — Z8673 Personal history of transient ischemic attack (TIA), and cerebral infarction without residual deficits: Secondary | ICD-10-CM | POA: Diagnosis not present

## 2014-04-08 DIAGNOSIS — I679 Cerebrovascular disease, unspecified: Secondary | ICD-10-CM | POA: Diagnosis present

## 2014-04-08 DIAGNOSIS — Z887 Allergy status to serum and vaccine status: Secondary | ICD-10-CM

## 2014-04-08 DIAGNOSIS — I2510 Atherosclerotic heart disease of native coronary artery without angina pectoris: Secondary | ICD-10-CM | POA: Diagnosis present

## 2014-04-08 DIAGNOSIS — J449 Chronic obstructive pulmonary disease, unspecified: Secondary | ICD-10-CM | POA: Diagnosis present

## 2014-04-08 DIAGNOSIS — G8929 Other chronic pain: Secondary | ICD-10-CM | POA: Diagnosis present

## 2014-04-08 DIAGNOSIS — R0602 Shortness of breath: Secondary | ICD-10-CM | POA: Diagnosis not present

## 2014-04-08 DIAGNOSIS — Z85528 Personal history of other malignant neoplasm of kidney: Secondary | ICD-10-CM | POA: Diagnosis not present

## 2014-04-08 DIAGNOSIS — Z955 Presence of coronary angioplasty implant and graft: Secondary | ICD-10-CM

## 2014-04-08 DIAGNOSIS — R0781 Pleurodynia: Secondary | ICD-10-CM | POA: Diagnosis present

## 2014-04-08 DIAGNOSIS — R072 Precordial pain: Secondary | ICD-10-CM

## 2014-04-08 DIAGNOSIS — I252 Old myocardial infarction: Secondary | ICD-10-CM

## 2014-04-08 DIAGNOSIS — R918 Other nonspecific abnormal finding of lung field: Secondary | ICD-10-CM | POA: Diagnosis not present

## 2014-04-08 DIAGNOSIS — Z91030 Bee allergy status: Secondary | ICD-10-CM | POA: Diagnosis not present

## 2014-04-08 DIAGNOSIS — R079 Chest pain, unspecified: Secondary | ICD-10-CM | POA: Diagnosis present

## 2014-04-08 DIAGNOSIS — J189 Pneumonia, unspecified organism: Principal | ICD-10-CM | POA: Diagnosis present

## 2014-04-08 DIAGNOSIS — J441 Chronic obstructive pulmonary disease with (acute) exacerbation: Secondary | ICD-10-CM | POA: Diagnosis present

## 2014-04-08 DIAGNOSIS — R0789 Other chest pain: Secondary | ICD-10-CM | POA: Diagnosis not present

## 2014-04-08 DIAGNOSIS — E663 Overweight: Secondary | ICD-10-CM | POA: Diagnosis present

## 2014-04-08 DIAGNOSIS — Z87891 Personal history of nicotine dependence: Secondary | ICD-10-CM | POA: Diagnosis not present

## 2014-04-08 DIAGNOSIS — E7849 Other hyperlipidemia: Secondary | ICD-10-CM | POA: Diagnosis present

## 2014-04-08 DIAGNOSIS — E785 Hyperlipidemia, unspecified: Secondary | ICD-10-CM | POA: Diagnosis present

## 2014-04-08 DIAGNOSIS — J42 Unspecified chronic bronchitis: Secondary | ICD-10-CM | POA: Diagnosis not present

## 2014-04-08 DIAGNOSIS — Z7982 Long term (current) use of aspirin: Secondary | ICD-10-CM | POA: Diagnosis not present

## 2014-04-08 DIAGNOSIS — I10 Essential (primary) hypertension: Secondary | ICD-10-CM | POA: Diagnosis present

## 2014-04-08 DIAGNOSIS — M549 Dorsalgia, unspecified: Secondary | ICD-10-CM | POA: Diagnosis not present

## 2014-04-08 DIAGNOSIS — J181 Lobar pneumonia, unspecified organism: Secondary | ICD-10-CM

## 2014-04-08 HISTORY — DX: Pleurodynia: R07.81

## 2014-04-08 LAB — CBC WITH DIFFERENTIAL/PLATELET
BASOS ABS: 0.1 10*3/uL (ref 0.0–0.1)
BASOS PCT: 1 % (ref 0–1)
Eosinophils Absolute: 0.4 10*3/uL (ref 0.0–0.7)
Eosinophils Relative: 4 % (ref 0–5)
HCT: 39.6 % (ref 39.0–52.0)
Hemoglobin: 13.2 g/dL (ref 13.0–17.0)
LYMPHS PCT: 19 % (ref 12–46)
Lymphs Abs: 1.7 10*3/uL (ref 0.7–4.0)
MCH: 31.1 pg (ref 26.0–34.0)
MCHC: 33.3 g/dL (ref 30.0–36.0)
MCV: 93.2 fL (ref 78.0–100.0)
MONO ABS: 0.8 10*3/uL (ref 0.1–1.0)
Monocytes Relative: 9 % (ref 3–12)
NEUTROS ABS: 6 10*3/uL (ref 1.7–7.7)
NEUTROS PCT: 67 % (ref 43–77)
Platelets: 300 10*3/uL (ref 150–400)
RBC: 4.25 MIL/uL (ref 4.22–5.81)
RDW: 12.5 % (ref 11.5–15.5)
WBC: 9 10*3/uL (ref 4.0–10.5)

## 2014-04-08 LAB — COMPREHENSIVE METABOLIC PANEL
ALT: 18 U/L (ref 0–53)
AST: 23 U/L (ref 0–37)
Albumin: 3.7 g/dL (ref 3.5–5.2)
Alkaline Phosphatase: 61 U/L (ref 39–117)
Anion gap: 4 — ABNORMAL LOW (ref 5–15)
BILIRUBIN TOTAL: 0.6 mg/dL (ref 0.3–1.2)
BUN: 10 mg/dL (ref 6–23)
CO2: 27 mmol/L (ref 19–32)
CREATININE: 1.07 mg/dL (ref 0.50–1.35)
Calcium: 9.1 mg/dL (ref 8.4–10.5)
Chloride: 108 mmol/L (ref 96–112)
GFR calc Af Amer: 75 mL/min — ABNORMAL LOW (ref >90)
GFR, EST NON AFRICAN AMERICAN: 65 mL/min — AB (ref >90)
Glucose, Bld: 109 mg/dL — ABNORMAL HIGH (ref 70–99)
Potassium: 3.7 mmol/L (ref 3.5–5.1)
Sodium: 139 mmol/L (ref 135–145)
TOTAL PROTEIN: 6.7 g/dL (ref 6.0–8.3)

## 2014-04-08 LAB — CBC
HEMATOCRIT: 40.6 % (ref 39.0–52.0)
Hemoglobin: 13.6 g/dL (ref 13.0–17.0)
MCH: 31.2 pg (ref 26.0–34.0)
MCHC: 33.5 g/dL (ref 30.0–36.0)
MCV: 93.1 fL (ref 78.0–100.0)
PLATELETS: 294 10*3/uL (ref 150–400)
RBC: 4.36 MIL/uL (ref 4.22–5.81)
RDW: 12.6 % (ref 11.5–15.5)
WBC: 9.5 10*3/uL (ref 4.0–10.5)

## 2014-04-08 LAB — TROPONIN I
Troponin I: <0.03 ng/mL (ref <0.031)
Troponin I: <0.03 ng/mL (ref <0.031)
Troponin I: <0.03 ng/mL (ref <0.031)

## 2014-04-08 LAB — TSH: TSH: 2.94 u[IU]/mL (ref 0.350–4.500)

## 2014-04-08 LAB — CREATININE, SERUM
Creatinine, Ser: 1.1 mg/dL (ref 0.50–1.35)
GFR calc Af Amer: 73 mL/min — ABNORMAL LOW (ref >90)
GFR, EST NON AFRICAN AMERICAN: 63 mL/min — AB (ref >90)

## 2014-04-08 MED ORDER — FAMOTIDINE 40 MG PO TABS
40.0000 mg | ORAL_TABLET | Freq: Every day | ORAL | Status: DC
  Administered 2014-04-08: 40 mg via ORAL
  Filled 2014-04-08 (×2): qty 1

## 2014-04-08 MED ORDER — TRAZODONE HCL 50 MG PO TABS
50.0000 mg | ORAL_TABLET | Freq: Every evening | ORAL | Status: DC | PRN
  Filled 2014-04-08 (×2): qty 1

## 2014-04-08 MED ORDER — IPRATROPIUM-ALBUTEROL 0.5-2.5 (3) MG/3ML IN SOLN
3.0000 mL | Freq: Four times a day (QID) | RESPIRATORY_TRACT | Status: DC
  Administered 2014-04-08 – 2014-04-09 (×5): 3 mL via RESPIRATORY_TRACT
  Filled 2014-04-08 (×6): qty 3

## 2014-04-08 MED ORDER — MORPHINE SULFATE 2 MG/ML IJ SOLN: 2.0000 mg | INTRAMUSCULAR | Status: DC | PRN

## 2014-04-08 MED ORDER — ONDANSETRON HCL 4 MG/2ML IJ SOLN: 4.0000 mg | Freq: Four times a day (QID) | INTRAMUSCULAR | Status: DC | PRN

## 2014-04-08 MED ORDER — HYDROCODONE-ACETAMINOPHEN 5-325 MG PO TABS
1.0000 | ORAL_TABLET | Freq: Two times a day (BID) | ORAL | Status: DC | PRN
  Administered 2014-04-08: 1 via ORAL
  Filled 2014-04-08: qty 1

## 2014-04-08 MED ORDER — AMLODIPINE BESYLATE 5 MG PO TABS
5.0000 mg | ORAL_TABLET | Freq: Two times a day (BID) | ORAL | Status: DC
  Administered 2014-04-08 – 2014-04-09 (×3): 5 mg via ORAL
  Filled 2014-04-08 (×4): qty 1

## 2014-04-08 MED ORDER — ALBUTEROL SULFATE (2.5 MG/3ML) 0.083% IN NEBU: 2.5000 mg | INHALATION_SOLUTION | Freq: Four times a day (QID) | RESPIRATORY_TRACT | Status: DC

## 2014-04-08 MED ORDER — IPRATROPIUM BROMIDE 0.02 % IN SOLN: 0.5000 mg | Freq: Four times a day (QID) | RESPIRATORY_TRACT | Status: DC

## 2014-04-08 MED ORDER — DOCUSATE SODIUM 100 MG PO CAPS
100.0000 mg | ORAL_CAPSULE | Freq: Two times a day (BID) | ORAL | Status: DC
  Administered 2014-04-08: 100 mg via ORAL
  Filled 2014-04-08 (×3): qty 1

## 2014-04-08 MED ORDER — SODIUM CHLORIDE 0.9 % IV SOLN
INTRAVENOUS | Status: AC
  Administered 2014-04-08: 10:00:00 via INTRAVENOUS

## 2014-04-08 MED ORDER — ALBUTEROL SULFATE (2.5 MG/3ML) 0.083% IN NEBU: 2.5000 mg | INHALATION_SOLUTION | RESPIRATORY_TRACT | Status: DC | PRN

## 2014-04-08 MED ORDER — ADULT MULTIVITAMIN W/MINERALS CH
1.0000 | ORAL_TABLET | Freq: Every day | ORAL | Status: DC
  Administered 2014-04-08 – 2014-04-09 (×2): 1 via ORAL
  Filled 2014-04-08 (×2): qty 1

## 2014-04-08 MED ORDER — METOPROLOL TARTRATE 12.5 MG HALF TABLET
12.5000 mg | ORAL_TABLET | Freq: Two times a day (BID) | ORAL | Status: DC
  Administered 2014-04-08 – 2014-04-09 (×3): 12.5 mg via ORAL
  Filled 2014-04-08 (×4): qty 1

## 2014-04-08 MED ORDER — BACLOFEN 10 MG PO TABS
10.0000 mg | ORAL_TABLET | Freq: Every evening | ORAL | Status: DC | PRN
  Filled 2014-04-08 (×2): qty 1

## 2014-04-08 MED ORDER — CLOPIDOGREL BISULFATE 75 MG PO TABS
75.0000 mg | ORAL_TABLET | Freq: Every day | ORAL | Status: DC
  Administered 2014-04-08 – 2014-04-09 (×2): 75 mg via ORAL
  Filled 2014-04-08 (×2): qty 1

## 2014-04-08 MED ORDER — CEFTRIAXONE SODIUM IN DEXTROSE 20 MG/ML IV SOLN
1.0000 g | INTRAVENOUS | Status: DC
  Administered 2014-04-08: 1 g via INTRAVENOUS
  Filled 2014-04-08 (×2): qty 50

## 2014-04-08 MED ORDER — SODIUM CHLORIDE 0.9 % IJ SOLN
3.0000 mL | Freq: Two times a day (BID) | INTRAMUSCULAR | Status: DC
  Administered 2014-04-08: 3 mL via INTRAVENOUS

## 2014-04-08 MED ORDER — ACETAMINOPHEN 650 MG RE SUPP: 650.0000 mg | Freq: Four times a day (QID) | RECTAL | Status: DC | PRN

## 2014-04-08 MED ORDER — ASPIRIN EC 81 MG PO TBEC
81.0000 mg | DELAYED_RELEASE_TABLET | Freq: Every day | ORAL | Status: DC
  Administered 2014-04-08 – 2014-04-09 (×2): 81 mg via ORAL
  Filled 2014-04-08 (×2): qty 1

## 2014-04-08 MED ORDER — GUAIFENESIN ER 600 MG PO TB12
1200.0000 mg | ORAL_TABLET | Freq: Two times a day (BID) | ORAL | Status: DC
  Administered 2014-04-08 – 2014-04-09 (×3): 1200 mg via ORAL
  Filled 2014-04-08 (×4): qty 2

## 2014-04-08 MED ORDER — DEXTROSE 5 % IV SOLN
500.0000 mg | INTRAVENOUS | Status: DC
  Administered 2014-04-08: 500 mg via INTRAVENOUS
  Filled 2014-04-08 (×2): qty 500

## 2014-04-08 MED ORDER — FLUTICASONE PROPIONATE HFA 44 MCG/ACT IN AERO
2.0000 | INHALATION_SPRAY | Freq: Two times a day (BID) | RESPIRATORY_TRACT | Status: DC
  Administered 2014-04-08 (×2): 2 via RESPIRATORY_TRACT
  Filled 2014-04-08 (×2): qty 10.6

## 2014-04-08 MED ORDER — PRAVASTATIN SODIUM 40 MG PO TABS
40.0000 mg | ORAL_TABLET | Freq: Every day | ORAL | Status: DC
  Administered 2014-04-08: 40 mg via ORAL
  Filled 2014-04-08 (×2): qty 1

## 2014-04-08 MED ORDER — ENOXAPARIN SODIUM 40 MG/0.4ML ~~LOC~~ SOLN
40.0000 mg | SUBCUTANEOUS | Status: DC
  Administered 2014-04-08: 40 mg via SUBCUTANEOUS
  Filled 2014-04-08 (×2): qty 0.4

## 2014-04-08 MED ORDER — ACETAMINOPHEN 325 MG PO TABS
650.0000 mg | ORAL_TABLET | Freq: Four times a day (QID) | ORAL | Status: DC | PRN
  Administered 2014-04-09: 650 mg via ORAL

## 2014-04-08 MED ORDER — NITROGLYCERIN 0.4 MG SL SUBL: 0.4000 mg | SUBLINGUAL_TABLET | SUBLINGUAL | Status: DC | PRN

## 2014-04-08 MED ORDER — ALUM & MAG HYDROXIDE-SIMETH 200-200-20 MG/5ML PO SUSP: 30.0000 mL | Freq: Four times a day (QID) | ORAL | Status: DC | PRN

## 2014-04-08 MED ORDER — CYANOCOBALAMIN 500 MCG PO TABS
500.0000 ug | ORAL_TABLET | Freq: Every day | ORAL | Status: DC
  Administered 2014-04-08 – 2014-04-09 (×2): 500 ug via ORAL
  Filled 2014-04-08 (×2): qty 1

## 2014-04-08 MED ORDER — ENALAPRIL MALEATE 20 MG PO TABS
20.0000 mg | ORAL_TABLET | Freq: Every day | ORAL | Status: DC
  Administered 2014-04-08: 20 mg via ORAL
  Filled 2014-04-08 (×2): qty 1

## 2014-04-08 MED ORDER — ONDANSETRON HCL 4 MG PO TABS: 4.0000 mg | ORAL_TABLET | Freq: Four times a day (QID) | ORAL | Status: DC | PRN

## 2014-04-08 MED ORDER — PANTOPRAZOLE SODIUM 40 MG PO TBEC
40.0000 mg | DELAYED_RELEASE_TABLET | Freq: Two times a day (BID) | ORAL | Status: DC
  Administered 2014-04-08 – 2014-04-09 (×3): 40 mg via ORAL
  Filled 2014-04-08 (×2): qty 1

## 2014-04-08 NOTE — ED Notes (Addendum)
Per EMS: pt from home reports pain between scapula radiating through front of central chest, pt gave self 3 nitros and 4 baby aspirin and 2  Nitro were given EMS. Pain is now at 1/10.  Pt hx of 2 stents with no MI.  Pt reports SOB and denies n/v/d.

## 2014-04-08 NOTE — ED Notes (Addendum)
pt from home reports pain between scapula radiating through front of central chest, pt gave self 3 nitros and 4 baby aspirin and 2  Nitro were given EMS. Pain is now at 1/10.  Pt hx of 2 stents with no MI.  Pt reports SOB and denies n/v/d.   Pt R carotid artery blocked.

## 2014-04-08 NOTE — Progress Notes (Signed)
Utilization Review Completed.Kathlyne Loud T2/14/2016  

## 2014-04-08 NOTE — H&P (Signed)
[facility] History and Physical                                                                                    Wayne Meadows, is a 80 y.o. male  MRN: [id number]   DOB - [birth year]  Admit Date - 04/08/2014  Outpatient Primary MD for the patient is PROVIDER NOT [facility].   With History of -  Past Medical History  Diagnosis Date  . Hypertension   . CVA (cerebral infarction) 2011    Right cerebral; total obstruction of the right ICA  . Coronary artery disease     S/p PCI 2011;  NSTEMI 12/12:  LHC/PCI 02/23/11: LAD 60% after the septal perforator, D1 occluded with distal collaterals, proximal RI 30-40%, AV circumflex stent patent with 60% stenosis after the stent, RCA 99%, EF 60-65%.  His RCA was treated with a bare-metal stent  . Tobacco abuse, in remission   . Renal carcinoma   . COPD (chronic obstructive pulmonary disease)       Past Surgical History  Procedure Laterality Date  . Hip relacement    . Kidney surgery    . Colon surgery    . Left heart catheterization with coronary angiogram N/A 02/23/2011    Procedure: LEFT HEART CATHETERIZATION WITH CORONARY ANGIOGRAM;  Surgeon: [name], MD;  Location: [facility] CATH LAB;  Service: Cardiovascular;  Laterality: N/A;  . Percutaneous coronary stent intervention (pci-s) N/A 02/23/2011    Procedure: PERCUTANEOUS CORONARY STENT INTERVENTION (PCI-S);  Surgeon: [name], MD;  Location: [facility] CATH LAB;  Service: Cardiovascular;  Laterality: N/A;  . Temporary pacemaker insertion N/A 02/23/2011    Procedure: TEMPORARY PACEMAKER INSERTION;  Surgeon: [name], MD;  Location: [facility] CATH LAB;  Service: Cardiovascular;  Laterality: N/A;  . Left heart catheterization with coronary angiogram N/A 03/18/2011    Procedure: LEFT HEART CATHETERIZATION WITH CORONARY ANGIOGRAM;  Surgeon: [name], MD;  Location: [facility] CATH LAB;  Service: Cardiovascular;  Laterality: N/A;    in for   Chief  Complaint  Patient presents with  . Chest Pain     HPI  Wayne Meadows  is a 80 y.o. male, retired Corporate treasurer, with a past medical history of renal cell carcinoma, coronary artery disease status post stenting, COPD, and CVA. [name] woke at 2 AM this morning (2/14) diaphoretic, with chest tightness and pain between his shoulder blades. He had a fever and chills. He reports coughing 4-5 times a day and brings up thick mucus (he doesn't know what color). His chest tightness and back pain have been intermittent since 2 AM. He describes the pain in his back as burning. He states the pain reminds him of his previous anginal pain. [name] lives alone and is independent with his ADLs. He is medically compliant. He tells me that he wakes up every day with pain somewhere in his body usually his back, hip, or abdomen where he has had previous surgeries. He seems to manage well despite his chronic pain.  In the ER, Mr. Rogelia Meadows troponin is normal, his EKG shows sinus rhythm, labs are unremarkable, he has a low-grade  fever. Chest x-ray shows right basilar pneumonia. We will admit him for treatment of community-acquired pneumonia and chest pain rule out.  Review of Systems   In addition to the HPI above,  + Fever-chills, No Headache, No changes with Vision or hearing, No problems swallowing food or Liquids, + Chronic Abdominal pain, No Nausea or Vomiting, he had one episode of diarrhea day before yesterday but none since. No Blood in stool or Urine, No dysuria, No new skin rashes or bruises, No new joints pains-aches,  No new weakness, tingling, numbness in any extremity, No recent weight gain or loss, A full 10 point Review of Systems was done, except as stated above, all other Review of Systems were negative.  Social History History  Substance Use Topics  . Smoking status: Former Smoker    Types: Cigarettes    Quit date: 02/24/2007  . Smokeless tobacco: Never Used  . Alcohol Use: Yes      Comment: socially    he is an ex smoker, and reports smoking for 58 years before he quit several years ago. He drinks a beer occasionally. He denies any recreational drugs. He lives alone but is in contact with his daughter.  Family History  his mother died of a heart attack when she was 29, he doesn't know how his father passed,, he died when the patient was 80 years old  Prior to Admission medications   Medication Sig Start Date End Date Taking? Authorizing Provider  amLODipine (NORVASC) 10 MG tablet Take 5 mg by mouth 2 (two) times daily.   Yes Historical Provider, MD  ARTIFICIAL TEAR OP Place 1 drop into both eyes 2 (two) times daily.    Yes Historical Provider, MD  aspirin EC 81 MG tablet Take 81 mg by mouth daily.     Yes Historical Provider, MD  baclofen (LIORESAL) 10 MG tablet Take 10 mg by mouth at bedtime as needed (sleep).    Yes Historical Provider, MD  clopidogrel (PLAVIX) 75 MG tablet Take 75 mg by mouth daily.   Yes Historical Provider, MD  cyanocobalamin 500 MCG tablet Take 500 mcg by mouth daily.   Yes Historical Provider, MD  docusate sodium (COLACE) 100 MG capsule Take 100 mg by mouth 2 (two) times daily.   Yes Historical Provider, MD  enalapril (VASOTEC) 20 MG tablet Take 20 mg by mouth at bedtime.    Yes Historical Provider, MD  EPINEPHrine (EPI-PEN) 0.3 mg/0.3 mL DEVI Inject 0.3 mg into the muscle once.   Yes Historical Provider, MD  Flaxseed, Linseed, (FLAXSEED OIL) 1000 MG CAPS Take 1,000 mg by mouth 2 (two) times daily.    Yes Historical Provider, MD  HYDROcodone-acetaminophen (NORCO/VICODIN) 5-325 MG per tablet Take 1 tablet by mouth 2 (two) times daily as needed for moderate pain.   Yes Historical Provider, MD  Ipratropium-Albuterol (COMBIVENT) 20-100 MCG/ACT AERS respimat Inhale 1 puff into the lungs 4 (four) times daily.   Yes Historical Provider, MD  metoprolol tartrate (LOPRESSOR) 12.5 mg TABS Take 0.5 tablets (12.5 mg total) by mouth 2 (two) times daily. 02/25/11  Yes  Wayne A Arguello, PA-C  mometasone (ASMANEX) 220 MCG/INH inhaler Inhale 1 puff into the lungs at bedtime.     Yes Historical Provider, MD  Multiple Vitamin (MULTIVITAMIN) tablet Take 1 tablet by mouth daily.   Yes Historical Provider, MD  nitroGLYCERIN (NITROSTAT) 0.4 MG SL tablet Place 0.4 mg under the tongue every 5 (five) minutes as needed for chest pain.   Yes  Historical Provider, MD  Omega-3 Fatty Acids (FISH OIL CONCENTRATE) 1000 MG CAPS Take 1,000 mg by mouth 2 (two) times daily.   Yes Historical Provider, MD  pantoprazole (PROTONIX) 40 MG tablet Take 40 mg by mouth 2 (two) times daily.   Yes Historical Provider, MD  pravastatin (PRAVACHOL) 40 MG tablet Take 40 mg by mouth at bedtime.   Yes Historical Provider, MD  ranitidine (ZANTAC) 300 MG tablet Take 300 mg by mouth at bedtime.   Yes Historical Provider, MD  nitroGLYCERIN (NITROSTAT) 0.4 MG SL tablet Place 1 tablet (0.4 mg total) under the tongue every 5 (five) minutes as needed for chest pain. 02/25/11 02/25/12  Wayne A Arguello, PA-C  predniSONE (DELTASONE) 20 MG tablet Take 2 tablets (40 mg total) by mouth daily. Patient not taking: Reported on 04/08/2014 08/30/13   Jasper Riling. Alvino Chapel, MD    Allergies  Allergen Reactions  . Influenza Vaccines     "Mortally sick for 2 weeks"  . Bee Venom     unknown    Physical Exam  Vitals  Blood pressure 132/70, pulse 67, temperature 99.3 F (37.4 C), temperature source Oral, resp. rate 28, height 6\' 2"  (1.88 m), weight 92.987 kg (205 lb), SpO2 96 %.   General:  lying in bed in NAD,   Psych:  Normal affect and insight, Not Suicidal or Homicidal, Awake Alert, Oriented X 3.  Neuro:   No F.N deficits, ALL C.Nerves Intact, Strength 5/5 all 4 extremities, Sensation intact all 4 extremities.  ENT:  Ears and Eyes appear Normal, Conjunctivae clear, PER. Moist oral mucosa without erythema or exudates.  Neck:  Supple, No lymphadenopathy appreciated  Respiratory:  Symmetrical chest wall movement,  Good air movement bilaterally, CTAB.  Cardiac:  RRR, No Murmurs, no LE edema noted, no JVD.    Abdomen:  Chronically distended abdomen with periumbilical hernia, soft, positive bowel sounds, nontender.  Skin:  No Cyanosis, Normal Skin Turgor, No Skin Rash or Bruise.  Extremities:  Able to move all 4. 5/5 strength in each,  no effusions.  Data Review  CBC  Recent Labs Lab 04/08/14 0450  WBC 9.0  HGB 13.2  HCT 39.6  PLT 300  MCV 93.2  MCH 31.1  MCHC 33.3  RDW 12.5  LYMPHSABS 1.7  MONOABS 0.8  EOSABS 0.4  BASOSABS 0.1    Chemistries   Recent Labs Lab 04/08/14 0450  NA 139  K 3.7  CL 108  CO2 27  GLUCOSE 109*  BUN 10  CREATININE 1.07  CALCIUM 9.1  AST 23  ALT 18  ALKPHOS 61  BILITOT 0.6    Cardiac Enzymes  Recent Labs Lab 04/08/14 0450  TROPONINI <0.03    Imaging results:   Dg Chest 2 View  04/08/2014   CLINICAL DATA:  Acute onset of shortness of breath, chest pain and upper back pain. Initial encounter.  EXAM: CHEST  2 VIEW  COMPARISON:  Chest radiograph and CTA of the chest performed 10/25/2011  FINDINGS: The lungs are well-aerated. Mild right basilar opacity may reflect atelectasis or possibly mild pneumonia. There is no evidence of pleural effusion or pneumothorax.  The heart is normal in size; the mediastinal contour is within normal limits. No acute osseous abnormalities are seen.  IMPRESSION: Mild right basilar airspace opacity may reflect atelectasis or possibly mild pneumonia.   Electronically Signed   By: Garald Balding M.D.   On: 04/08/2014 06:00    My personal review of EKG: NSR   Assessment &  Plan  Principal Problem:   CAP (community acquired pneumonia) Active Problems:   Cerebrovascular disease   COPD (chronic obstructive pulmonary disease)   HLD (hyperlipidemia)   Hypertension   Chest pain  Community-acquired pneumonia in the setting of COPD. Will admit and treat with azithromycin and Rocephin IV, nebulizers, and supportive  care.  Chest pain with history of CAD and Stenting. EKG is reassuring. First troponin is negative.  We will cycle troponins. Repeat EKG when necessary chest pain. Sublingual nitroglycerin when necessary. He is on Pepcid and Protonix. Continue aspirin and plavix.  COPD Not currently wheezing. We will continue home inhalers (except Combivent) and add scheduled nebulizers.  HTN Continue amlodipine, metoprolol, enalapril  Chronic pain Continue baclofen and norco.  HLD Continue pravastatin.     DVT Prophylaxis: lovenox  AM Labs Ordered, also please review Full Orders  Family Communication:   Patient is alert and orientated.  He understands the plan of care.  Code Status:  full  Condition:  Guarded but stable.  Time spent in minutes : [place],  PA-C on 04/08/2014 at 7:46 AM  Between 7am to 7pm - Pager - [phone]  After 7pm go to [email or web address] - password [email or web address]  And look for the night coverage person covering me after hours  [facility]

## 2014-04-08 NOTE — ED Provider Notes (Signed)
CSN: [id number]     Arrival date & time 04/08/14  0433 History   First MD Initiated Contact with Patient 04/08/14 0446     Chief Complaint  Patient presents with  . Chest Pain     (Consider location/radiation/quality/duration/timing/severity/associated sxs/prior Treatment) HPI  patient with a history of CVA and CAD with stent placed in 2012 presents with right-sided scapular pain wake him from sleep around 2:30 this morning. Per EMS the pain radiated through to the chest. Patient does not relate this history to me. He states he has had no pain in the chest with the scapular pain. He states since he has been in the emergency department he is having mild chest pressure. This does not radiate. His Pain has improved. He took 3 nitroglycerin and 4 baby aspirins before calling EMS. EMS gave the patient 2 nitroglycerin glycerin en route. He states his pain is now mostly resolved. At no point did he have nausea, vomiting or shortness of breath. He's had similar pain in the past with both gallbladder-related issues and when requiring cardiac stents. He's had no increased swelling or pain in his legs. Past Medical History  Diagnosis Date  . Hypertension   . CVA (cerebral infarction) 2011    Right cerebral; total obstruction of the right ICA  . Coronary artery disease     S/p PCI 2011;  NSTEMI 12/12:  LHC/PCI 02/23/11: LAD 60% after the septal perforator, D1 occluded with distal collaterals, proximal RI 30-40%, AV circumflex stent patent with 60% stenosis after the stent, RCA 99%, EF 60-65%.  His RCA was treated with a bare-metal stent  . Tobacco abuse, in remission   . Renal carcinoma   . COPD (chronic obstructive pulmonary disease)    Past Surgical History  Procedure Laterality Date  . Hip relacement    . Kidney surgery    . Colon surgery    . Left heart catheterization with coronary angiogram N/A 02/23/2011    Procedure: LEFT HEART CATHETERIZATION WITH CORONARY ANGIOGRAM;  Surgeon: [name], MD;  Location: [facility] CATH LAB;  Service: Cardiovascular;  Laterality: N/A;  . Percutaneous coronary stent intervention (pci-s) N/A 02/23/2011    Procedure: PERCUTANEOUS CORONARY STENT INTERVENTION (PCI-S);  Surgeon: [name], MD;  Location: [facility] CATH LAB;  Service: Cardiovascular;  Laterality: N/A;  . Temporary pacemaker insertion N/A 02/23/2011    Procedure: TEMPORARY PACEMAKER INSERTION;  Surgeon: [name], MD;  Location: [facility] CATH LAB;  Service: Cardiovascular;  Laterality: N/A;  . Left heart catheterization with coronary angiogram N/A 03/18/2011    Procedure: LEFT HEART CATHETERIZATION WITH CORONARY ANGIOGRAM;  Surgeon: [name], MD;  Location: [facility] CATH LAB;  Service: Cardiovascular;  Laterality: N/A;   History reviewed. No pertinent family history. History  Substance Use Topics  . Smoking status: Former Smoker    Types: Cigarettes    Quit date: 02/24/2007  . Smokeless tobacco: Never Used  . Alcohol Use: Yes     Comment: socially     Review of Systems  Constitutional: Negative for fever and chills.  Respiratory: Positive for chest tightness. Negative for cough and shortness of breath.   Cardiovascular: Negative for chest pain.  Gastrointestinal: Negative for nausea, vomiting, abdominal pain and diarrhea.  Musculoskeletal: Positive for back pain. Negative for neck pain and neck stiffness.  Skin: Negative for rash and wound.  Neurological: Negative for dizziness, weakness, light-headedness, numbness and headaches.  All other systems reviewed and are negative.     Allergies  Influenza vaccines and Bee venom  Home Medications   Prior to Admission medications   Medication Sig Start Date End Date Taking? Authorizing Provider  amLODipine (NORVASC) 10 MG tablet Take 5 mg by mouth daily.    Historical Provider, MD  ARTIFICIAL TEAR OP Place 1 drop into both eyes 4 (four) times daily.     Historical Provider, MD  aspirin EC 81 MG tablet Take 81 mg by mouth daily.       Historical Provider, MD  baclofen (LIORESAL) 10 MG tablet Take 10 mg by mouth at bedtime as needed (sleep).     Historical Provider, MD  cyanocobalamin 500 MCG tablet Take 500 mcg by mouth daily.    Historical Provider, MD  docusate sodium (COLACE) 100 MG capsule Take 100 mg by mouth 2 (two) times daily.    Historical Provider, MD  enalapril (VASOTEC) 20 MG tablet Take 20 mg by mouth at bedtime.     Historical Provider, MD  EPINEPHrine (EPI-PEN) 0.3 mg/0.3 mL DEVI Inject 0.3 mg into the muscle once.    Historical Provider, MD  Flaxseed, Linseed, (FLAXSEED OIL) 1000 MG CAPS Take 1,000 mg by mouth 2 (two) times daily.     Historical Provider, MD  HYDROcodone-acetaminophen (NORCO/VICODIN) 5-325 MG per tablet Take 1 tablet by mouth 2 (two) times daily as needed for moderate pain.    Historical Provider, MD  Ipratropium-Albuterol (COMBIVENT) 20-100 MCG/ACT AERS respimat Inhale 1 puff into the lungs 4 (four) times daily.    Historical Provider, MD  metoprolol tartrate (LOPRESSOR) 12.5 mg TABS Take 0.5 tablets (12.5 mg total) by mouth 2 (two) times daily. 02/25/11   [name], PA-C  mometasone (ASMANEX) 220 MCG/INH inhaler Inhale 1 puff into the lungs at bedtime.      Historical Provider, MD  Multiple Vitamin (MULTIVITAMIN) tablet Take 1 tablet by mouth daily.    Historical Provider, MD  nitroGLYCERIN (NITROSTAT) 0.4 MG SL tablet Place 1 tablet (0.4 mg total) under the tongue every 5 (five) minutes as needed for chest pain. 02/25/11 02/25/12  [name], PA-C  Omega-3 Fatty Acids (FISH OIL CONCENTRATE) 1000 MG CAPS Take 1,000 mg by mouth 2 (two) times daily.    Historical Provider, MD  pantoprazole (PROTONIX) 40 MG tablet Take 40 mg by mouth 2 (two) times daily.    Historical Provider, MD  pravastatin (PRAVACHOL) 40 MG tablet Take 40 mg by mouth at bedtime.    Historical Provider, MD  predniSONE (DELTASONE) 20 MG tablet Take 2 tablets (40 mg total) by mouth daily. 08/30/13   Jasper Riling. [place], MD   ranitidine (ZANTAC) 300 MG tablet Take 300 mg by mouth at bedtime.    Historical Provider, MD   BP 132/70 mmHg  Pulse 67  Temp(Src) 99.3 F (37.4 C) (Oral)  Resp 28  Ht 6\' 2"  (1.88 m)  Wt 205 lb (92.987 kg)  BMI 26.31 kg/m2  SpO2 96% Physical Exam  Constitutional: He is oriented to person, place, and time. He appears well-developed and well-nourished. No distress.  HENT:  Head: Normocephalic and atraumatic.  Mouth/Throat: Oropharynx is clear and moist.  Eyes: EOM are normal. Pupils are equal, round, and reactive to light.  Neck: Normal range of motion. Neck supple.  Cardiovascular: Normal rate and regular rhythm.   Pulmonary/Chest: Effort normal and breath sounds normal. No respiratory distress. He has no wheezes. He has no rales. He exhibits no tenderness.  Abdominal: Soft. Bowel sounds are normal. He exhibits no distension and no  mass. There is no tenderness. There is no rebound and no guarding.  Musculoskeletal: Normal range of motion. He exhibits tenderness. He exhibits no edema.   Patient with mild reproduced tenderness of the thoracic back in the right paraspinal region. There is no obvious deformity or evidence of trauma.   Patient has no calf tenderness or swelling.  Neurological: He is alert and oriented to person, place, and time.   5/5 motor in all extremities. Sensation is grossly intact.  Skin: Skin is warm and dry. No rash noted. No erythema.  Psychiatric: He has a normal mood and affect. His behavior is normal.  Nursing note and vitals reviewed.   ED Course  Procedures (including critical care time) Labs Review Labs Reviewed  COMPREHENSIVE METABOLIC PANEL - Abnormal; Notable for the following:    Glucose, Bld 109 (*)    GFR calc non Af Amer 65 (*)    GFR calc Af Amer 75 (*)    Anion gap 4 (*)    All other components within normal limits  CBC WITH DIFFERENTIAL/PLATELET  TROPONIN I    Imaging Review Dg Chest 2 View  04/08/2014   CLINICAL DATA:  Acute  onset of shortness of breath, chest pain and upper back pain. Initial encounter.  EXAM: CHEST  2 VIEW  COMPARISON:  Chest radiograph and CTA of the chest performed 10/25/2011  FINDINGS: The lungs are well-aerated. Mild right basilar opacity may reflect atelectasis or possibly mild pneumonia. There is no evidence of pleural effusion or pneumothorax.  The heart is normal in size; the mediastinal contour is within normal limits. No acute osseous abnormalities are seen.  IMPRESSION: Mild right basilar airspace opacity may reflect atelectasis or possibly mild pneumonia.   Electronically Signed   By: Garald Balding M.D.   On: 04/08/2014 06:00     EKG Interpretation   Date/Time:  Sunday April 08 2014 04:41:39 EST Ventricular Rate:  74 PR Interval:  183 QRS Duration: 96 QT Interval:  393 QTC Calculation: 436 R Axis:   79 Text Interpretation:  Sinus rhythm Anteroseptal infarct, old  no acute  changes noted Confirmed by Lita Mains  MD, Violett Hobbs ([id number]) on 04/08/2014  5:45:56 AM      MDM   Final diagnoses:  Chest pain    when questioned further patient states he has had increased cough with some sputum production. Temp 99.3. Normal WBC. CXR PNA vs atelectasis. Chest pain/pressure has completely resolved. Will discuss with [facility] about admission for r/o MI.    discussed with Dr. Posey Pronto. Advised holding abx. Will admit to obs tele bed.   Julianne Rice, MD 04/08/14 [phone]

## 2014-04-08 NOTE — ED Notes (Signed)
Admitting MD at the bedside.  

## 2014-04-09 DIAGNOSIS — E785 Hyperlipidemia, unspecified: Secondary | ICD-10-CM

## 2014-04-09 LAB — BASIC METABOLIC PANEL
ANION GAP: 4 — AB (ref 5–15)
BUN: 9 mg/dL (ref 6–23)
CALCIUM: 8.7 mg/dL (ref 8.4–10.5)
CO2: 26 mmol/L (ref 19–32)
Chloride: 111 mmol/L (ref 96–112)
Creatinine, Ser: 1.05 mg/dL (ref 0.50–1.35)
GFR calc non Af Amer: 66 mL/min — ABNORMAL LOW (ref >90)
GFR, EST AFRICAN AMERICAN: 77 mL/min — AB (ref >90)
Glucose, Bld: 101 mg/dL — ABNORMAL HIGH (ref 70–99)
POTASSIUM: 3.6 mmol/L (ref 3.5–5.1)
Sodium: 141 mmol/L (ref 135–145)

## 2014-04-09 LAB — CBC
HEMATOCRIT: 36.5 % — AB (ref 39.0–52.0)
HEMOGLOBIN: 12.3 g/dL — AB (ref 13.0–17.0)
MCH: 31.2 pg (ref 26.0–34.0)
MCHC: 33.7 g/dL (ref 30.0–36.0)
MCV: 92.6 fL (ref 78.0–100.0)
PLATELETS: 269 10*3/uL (ref 150–400)
RBC: 3.94 MIL/uL — AB (ref 4.22–5.81)
RDW: 12.8 % (ref 11.5–15.5)
WBC: 7.6 10*3/uL (ref 4.0–10.5)

## 2014-04-09 MED ORDER — LEVOFLOXACIN 500 MG PO TABS: 500.0000 mg | ORAL_TABLET | Freq: Every day | ORAL | Status: DC

## 2014-04-09 MED ORDER — GUAIFENESIN ER 600 MG PO TB12: 1200.0000 mg | ORAL_TABLET | Freq: Two times a day (BID) | ORAL | Status: DC

## 2014-04-09 MED ORDER — LEVOFLOXACIN 500 MG PO TABS
500.0000 mg | ORAL_TABLET | Freq: Every day | ORAL | Status: DC
  Administered 2014-04-09: 500 mg via ORAL
  Filled 2014-04-09: qty 1

## 2014-04-09 NOTE — Discharge Summary (Signed)
Physician Discharge Summary  Wayne Meadows [id number] DOB: 1934/02/25 DOA: 04/08/2014  PCP: PROVIDER NOT IN SYSTEM  Admit date: 04/08/2014 Discharge date: 04/09/2014  Time spent: 45 minutes  Recommendations for Outpatient Follow-up:  Patient will be discharged home. He will need a follow-up with his primary care physician within 1-2 weeks of discharge. Patient to continue taking his medications as prescribed. Patient should follow a heart healthy diet, and resume activity as tolerated.  Discharge Diagnoses:  Immunity acquired pneumonia Chest pain COPD Essential hypertension Chronic pain Hyperlipidemia  Discharge Condition: Stable  Diet recommendation: Heart healthy  Filed Weights   04/08/14 0447 04/08/14 0751  Weight: 92.987 kg (205 lb) 92.4 kg (203 lb 11.3 oz)    History of present illness:  On 04/08/2014 by Imogene Burn, PA with Dr. Gevena Barre Wayne Meadows is a 80 y.o. male, retired [employer] treasurer, with a past medical history of renal cell carcinoma, coronary artery disease status post stenting, COPD, and CVA. Wayne Meadows woke at 2 AM this morning (2/14) diaphoretic, with chest tightness and pain between his shoulder blades. He had a fever and chills. He reports coughing 4-5 times a day and brings up thick mucus (he doesn't know what color). His chest tightness and back pain have been intermittent since 2 AM. He describes the pain in his back as burning. He states the pain reminds him of his previous anginal pain. Wayne Meadows lives alone and is independent with his ADLs. He is medically compliant. He tells me that he wakes up every day with pain somewhere in his body usually his back, hip, or abdomen where he has had previous surgeries. He seems to manage well despite his chronic pain. In the ER, Wayne Meadows troponin is normal, his EKG shows sinus rhythm, labs are unremarkable, he has a low-grade fever. Chest x-ray shows right basilar pneumonia. We will admit him for treatment of  community-acquired pneumonia and chest pain rule out.  Hospital Course:  Community-acquired pneumonia in the setting of COPD. -Patient initially placed on azithromycin and ceftriaxone along with DuoNeb treatments and antitussives -Patient has improved, feels his shortness of breath has improved -Wil discharge patient with Levaquin 500 mg daily for additional 5 days, along with antitussive -Patient should continue his breathing treatments at home  Chest pain with history of CAD and Stenting. -Troponins were cycled and found to be negative -Patient denied any chest pain stated it was more back pain -Likely secondary to his pneumonia -Patient continue his home medications aspirin, Plavix, statin, Metoprolol, enalapril   COPD -Currently stable, patient is not wheezing  -Continue home medications   Essential hypertension  -Stable, Continue amlodipine, metoprolol, enalapril  Chronic pain -Continue baclofen and norco.  Hyperlipidemia  -Continue pravastatin.   Procedures:  None   Consultations:  None   Discharge Exam: Filed Vitals:   04/09/14 0440  BP: 131/51  Pulse: 73  Temp: 98.3 F (36.8 C)  Resp: 20     General: Well developed, well nourished, NAD, appears stated age  HEENT: NCAT,  mucous membranes moist.  Cardiovascular: S1 S2 auscultated, RRR  Respiratory: Clear to auscultation bilaterally with equal chest rise  Abdomen: Soft, nontender, nondistended, + bowel sounds  Extremities: warm dry without cyanosis clubbing or edema  Neuro: AAOx3, nonfocal  Psych: Normal affect and demeanor with intact judgement and insight  Discharge Instructions      Discharge Instructions    Discharge instructions    Complete by:  As directed   Patient will be discharged home.  He will need a follow-up with his primary care physician within 1-2 weeks of discharge. Patient to continue taking his medications as prescribed. Patient should follow a heart healthy diet, and  resume activity as tolerated.            Medication List    STOP taking these medications        predniSONE 20 MG tablet  Commonly known as:  DELTASONE      TAKE these medications        amLODipine 10 MG tablet  Commonly known as:  NORVASC  Take 5 mg by mouth 2 (two) times daily.     ARTIFICIAL TEAR OP  Place 1 drop into both eyes 2 (two) times daily.     aspirin EC 81 MG tablet  Take 81 mg by mouth daily.     baclofen 10 MG tablet  Commonly known as:  LIORESAL  Take 10 mg by mouth at bedtime as needed (sleep).     clopidogrel 75 MG tablet  Commonly known as:  PLAVIX  Take 75 mg by mouth daily.     cyanocobalamin 500 MCG tablet  Take 500 mcg by mouth daily.     docusate sodium 100 MG capsule  Commonly known as:  COLACE  Take 100 mg by mouth 2 (two) times daily.     enalapril 20 MG tablet  Commonly known as:  VASOTEC  Take 20 mg by mouth at bedtime.     EPINEPHrine 0.3 mg/0.3 mL Devi  Commonly known as:  EPI-PEN  Inject 0.3 mg into the muscle once.     FISH OIL CONCENTRATE 1000 MG Caps  Take 1,000 mg by mouth 2 (two) times daily.     Flaxseed Oil 1000 MG Caps  Take 1,000 mg by mouth 2 (two) times daily.     guaiFENesin 600 MG 12 hr tablet  Commonly known as:  MUCINEX  Take 2 tablets (1,200 mg total) by mouth 2 (two) times daily.     HYDROcodone-acetaminophen 5-325 MG per tablet  Commonly known as:  NORCO/VICODIN  Take 1 tablet by mouth 2 (two) times daily as needed for moderate pain.     Ipratropium-Albuterol 20-100 MCG/ACT Aers respimat  Commonly known as:  COMBIVENT  Inhale 1 puff into the lungs 4 (four) times daily.     levofloxacin 500 MG tablet  Commonly known as:  LEVAQUIN  Take 1 tablet (500 mg total) by mouth daily.     metoprolol tartrate 12.5 mg Tabs tablet  Commonly known as:  LOPRESSOR  Take 0.5 tablets (12.5 mg total) by mouth 2 (two) times daily.     mometasone 220 MCG/INH inhaler  Commonly known as:  ASMANEX  Inhale 1 puff  into the lungs at bedtime.     multivitamin tablet  Take 1 tablet by mouth daily.     nitroGLYCERIN 0.4 MG SL tablet  Commonly known as:  NITROSTAT  Place 0.4 mg under the tongue every 5 (five) minutes as needed for chest pain.     pantoprazole 40 MG tablet  Commonly known as:  PROTONIX  Take 40 mg by mouth 2 (two) times daily.     pravastatin 40 MG tablet  Commonly known as:  PRAVACHOL  Take 40 mg by mouth at bedtime.     ranitidine 300 MG tablet  Commonly known as:  ZANTAC  Take 300 mg by mouth at bedtime.       Allergies  Allergen Reactions  . Influenza Vaccines     "  Mortally sick for 2 weeks"  . Bee Venom     unknown      The results of significant diagnostics from this hospitalization (including imaging, microbiology, ancillary and laboratory) are listed below for reference.    Significant Diagnostic Studies: Dg Chest 2 View  04/08/2014   CLINICAL DATA:  Acute onset of shortness of breath, chest pain and upper back pain. Initial encounter.  EXAM: CHEST  2 VIEW  COMPARISON:  Chest radiograph and CTA of the chest performed 10/25/2011  FINDINGS: The lungs are well-aerated. Mild right basilar opacity may reflect atelectasis or possibly mild pneumonia. There is no evidence of pleural effusion or pneumothorax.  The heart is normal in size; the mediastinal contour is within normal limits. No acute osseous abnormalities are seen.  IMPRESSION: Mild right basilar airspace opacity may reflect atelectasis or possibly mild pneumonia.   Electronically Signed   By: Garald Balding M.D.   On: 04/08/2014 06:00    Microbiology: No results found for this or any previous visit (from the past 240 hour(s)).   Labs: Basic Metabolic Panel:  Recent Labs Lab 04/08/14 0450 04/08/14 0855 04/09/14 0500  NA 139  --  141  K 3.7  --  3.6  CL 108  --  111  CO2 27  --  26  GLUCOSE 109*  --  101*  BUN 10  --  9  CREATININE 1.07 1.10 1.05  CALCIUM 9.1  --  8.7   Liver Function  Tests:  Recent Labs Lab 04/08/14 0450  AST 23  ALT 18  ALKPHOS 61  BILITOT 0.6  PROT 6.7  ALBUMIN 3.7   No results for input(s): LIPASE, AMYLASE in the last 168 hours. No results for input(s): AMMONIA in the last 168 hours. CBC:  Recent Labs Lab 04/08/14 0450 04/08/14 0855 04/09/14 0500  WBC 9.0 9.5 7.6  NEUTROABS 6.0  --   --   HGB 13.2 13.6 12.3*  HCT 39.6 40.6 36.5*  MCV 93.2 93.1 92.6  PLT 300 294 269   Cardiac Enzymes:  Recent Labs Lab 04/08/14 0450 04/08/14 0855 04/08/14 1440 04/08/14 1942  TROPONINI <0.03 <0.03 <0.03 <0.03   BNP: BNP (last 3 results) No results for input(s): BNP in the last 8760 hours.  ProBNP (last 3 results) No results for input(s): PROBNP in the last 8760 hours.  CBG: No results for input(s): GLUCAP in the last 168 hours.     SignedCristal Ford  [facility] 04/09/2014, 12:13 PM

## 2014-04-09 NOTE — Evaluation (Signed)
Physical Therapy Evaluation and discharge Patient Details Name: Wayne Meadows MRN: [id number] DOB: 1934-04-05 Today's Date: 04/09/2014   History of Present Illness  Admitted with CAP.  Clinical Impression  Pt moving at baseline level and no skilled PT needs identified.  Will d/c from PT at this time.  o2 94% on room air with gait.    Follow Up Recommendations No PT follow up    Equipment Recommendations  None recommended by PT    Recommendations for Other Services       Precautions / Restrictions        Mobility  Bed Mobility Overal bed mobility: Independent                Transfers Overall transfer level: Modified independent                  Ambulation/Gait Ambulation/Gait assistance: Modified independent (Device/Increase time);Supervision Ambulation Distance (Feet): 225 Feet Assistive device: None Gait Pattern/deviations: Step-through pattern Gait velocity: WNL   General Gait Details: slight limp with gait due to leg length discrepancy.  o2 94% and HR 93 bpm on room air  Stairs            Wheelchair Mobility    Modified Rankin (Stroke Patients Only)       Balance Overall balance assessment: No apparent balance deficits (not formally assessed)                                           Pertinent Vitals/Pain Pain Assessment: 0-10 Pain Score: 4  Pain Location: back, R hip Pain Descriptors / Indicators: Aching Pain Intervention(s): Patient requesting pain meds-RN notified    Home Living Family/patient expects to be discharged to:: Private residence Living Arrangements: Alone   Type of Home: House Home Access: Stairs to enter Entrance Stairs-Rails: Right Entrance Stairs-Number of Steps: 3 Home Layout: One level Home Equipment: None      Prior Function Level of Independence: Independent               Hand Dominance        Extremity/Trunk Assessment   Upper Extremity Assessment: Overall WFL for  tasks assessed           Lower Extremity Assessment: Overall WFL for tasks assessed      Cervical / Trunk Assessment: Normal  Communication   Communication: No difficulties  Cognition Arousal/Alertness: Awake/alert Behavior During Therapy: WFL for tasks assessed/performed Overall Cognitive Status: Within Functional Limits for tasks assessed                      General Comments General comments (skin integrity, edema, etc.): Educated on pursed lip breathing and o2 sats. Pt has o2 sat machine at home.    Exercises        Assessment/Plan    PT Assessment Patent does not need any further PT services  PT Diagnosis Generalized weakness   PT Problem List    PT Treatment Interventions     PT Goals (Current goals can be found in the Care Plan section) Acute Rehab PT Goals PT Goal Formulation: All assessment and education complete, DC therapy    Frequency     Barriers to discharge        Co-evaluation               End of Session   Activity  Tolerance: Patient tolerated treatment well Patient left: in chair Nurse Communication: Mobility status;Patient requests pain meds         Time: 0913-0924 PT Time Calculation (min) (ACUTE ONLY): 11 min   Charges:   PT Evaluation $Initial PT Evaluation Tier I: 1 Procedure     PT G Codes:        Wayne Meadows 04/09/2014, 9:33 AM

## 2014-04-09 NOTE — Clinical Social Work Note (Signed)
CSW consulted for transportation issues- confirmed that patient has no family/friends capable of driving him home today.  Dr would like taxi vs bus due to pneumonia/pt age/inclement weather- patient also live several blocks from nearest bus stop.  CSW provided RN with taxi voucher.  CSW signing off.  Domenica Reamer, [place] Social Worker [phone]

## 2014-04-09 NOTE — Progress Notes (Signed)
SATURATION QUALIFICATIONS: (This note is used to comply with regulatory documentation for home oxygen)  Patient Saturations on Room Air at Rest = 94%  Patient Saturations on Room Air while Ambulating = 94%   Santiago Glad L. Tamala Julian, [place] Pager [phone] 04/09/2014

## 2014-04-10 LAB — HIV ANTIBODY (ROUTINE TESTING W REFLEX): HIV Screen 4th Generation wRfx: NONREACTIVE

## 2014-04-14 LAB — CULTURE, BLOOD (ROUTINE X 2)
CULTURE: NO GROWTH
Culture: NO GROWTH

## 2015-04-19 DIAGNOSIS — M961 Postlaminectomy syndrome, not elsewhere classified: Secondary | ICD-10-CM | POA: Diagnosis not present

## 2015-04-19 DIAGNOSIS — Z79891 Long term (current) use of opiate analgesic: Secondary | ICD-10-CM | POA: Diagnosis not present

## 2015-04-19 DIAGNOSIS — M5416 Radiculopathy, lumbar region: Secondary | ICD-10-CM | POA: Diagnosis not present

## 2015-06-12 DIAGNOSIS — M5416 Radiculopathy, lumbar region: Secondary | ICD-10-CM | POA: Diagnosis not present

## 2015-07-17 DIAGNOSIS — M961 Postlaminectomy syndrome, not elsewhere classified: Secondary | ICD-10-CM | POA: Diagnosis not present

## 2015-07-17 DIAGNOSIS — M5416 Radiculopathy, lumbar region: Secondary | ICD-10-CM | POA: Diagnosis not present

## 2015-07-25 DIAGNOSIS — K625 Hemorrhage of anus and rectum: Secondary | ICD-10-CM

## 2015-07-25 HISTORY — DX: Hemorrhage of anus and rectum: K62.5

## 2015-08-08 ENCOUNTER — Emergency Department (HOSPITAL_COMMUNITY): Payer: Medicare Other

## 2015-08-08 ENCOUNTER — Emergency Department (HOSPITAL_COMMUNITY)
Admission: EM | Admit: 2015-08-08 | Discharge: 2015-08-09 | Disposition: A | Discharge status: Home / Self Care | Payer: Medicare Other | Attending: Emergency Medicine | Admitting: Emergency Medicine

## 2015-08-08 DIAGNOSIS — R51 Headache: Secondary | ICD-10-CM | POA: Insufficient documentation

## 2015-08-08 DIAGNOSIS — Z7982 Long term (current) use of aspirin: Secondary | ICD-10-CM | POA: Insufficient documentation

## 2015-08-08 DIAGNOSIS — R0602 Shortness of breath: Secondary | ICD-10-CM | POA: Insufficient documentation

## 2015-08-08 DIAGNOSIS — M5481 Occipital neuralgia: Secondary | ICD-10-CM | POA: Diagnosis not present

## 2015-08-08 DIAGNOSIS — R030 Elevated blood-pressure reading, without diagnosis of hypertension: Secondary | ICD-10-CM | POA: Diagnosis not present

## 2015-08-08 DIAGNOSIS — J449 Chronic obstructive pulmonary disease, unspecified: Secondary | ICD-10-CM | POA: Diagnosis not present

## 2015-08-08 DIAGNOSIS — I2510 Atherosclerotic heart disease of native coronary artery without angina pectoris: Secondary | ICD-10-CM | POA: Diagnosis not present

## 2015-08-08 DIAGNOSIS — T17208A Unspecified foreign body in pharynx causing other injury, initial encounter: Secondary | ICD-10-CM | POA: Diagnosis not present

## 2015-08-08 DIAGNOSIS — Z87891 Personal history of nicotine dependence: Secondary | ICD-10-CM | POA: Diagnosis not present

## 2015-08-08 DIAGNOSIS — J029 Acute pharyngitis, unspecified: Secondary | ICD-10-CM | POA: Diagnosis not present

## 2015-08-08 DIAGNOSIS — Z79899 Other long term (current) drug therapy: Secondary | ICD-10-CM | POA: Diagnosis not present

## 2015-08-08 DIAGNOSIS — I10 Essential (primary) hypertension: Secondary | ICD-10-CM | POA: Diagnosis not present

## 2015-08-08 DIAGNOSIS — R1310 Dysphagia, unspecified: Secondary | ICD-10-CM | POA: Diagnosis not present

## 2015-08-08 DIAGNOSIS — R070 Pain in throat: Secondary | ICD-10-CM | POA: Diagnosis not present

## 2015-08-08 NOTE — ED Notes (Signed)
Pt stated he does not have a sore throat but just cannot swallow.  States his abdomen feels like it is "burning" and has been doing this off and on today.

## 2015-08-08 NOTE — ED Notes (Signed)
Bed: WA01 Expected date:  Expected time:  Means of arrival:  Comments: EMS 81 yo male who states he feels like he can not swallow-negative for stroke symptoms

## 2015-08-08 NOTE — ED Provider Notes (Addendum)
CSN: [id number]     Arrival date & time 08/08/15  2230 History  By signing my name below, I, Jasmyn B. Alexander, attest that this documentation has been prepared under the direction and in the presence of Orpah Greek, MD.  Electronically Signed: Tedra Coupe. Sheppard Coil, ED Scribe. 08/08/2015. 11:42 PM.    Chief Complaint  Patient presents with  . Sore Throat    The history is provided by the patient. No language interpreter was used.   HPI Comments: Wayne Meadows is a 81 y.o. male, brought in by ambulance, with PMHx of HTN, CVA, and CAD who presents to the Emergency Department complaining of gradual onset, constant, worsening, sore throat onset 1 day. Pt reports one day ago he ate a piece of bread and drank Gatorade when he felt it get stuck in his throat, which he notes pain has been present since event. He has only been able to drink Gatorade, coffee, and take Plavix medication since then. Pt notes symptoms worsened PTA when he had sudden SOB while lying supine, prompting him to call EMS. Pt has associated loss of appetite and trouble upon swallowing. There are no alleviating factors. Denies headache.  Past Medical History  Diagnosis Date  . Hypertension   . CVA (cerebral infarction) 2011    Right cerebral; total obstruction of the right ICA  . Coronary artery disease     S/p PCI 2011;  NSTEMI 12/12:  LHC/PCI 02/23/11: LAD 60% after the septal perforator, D1 occluded with distal collaterals, proximal RI 30-40%, AV circumflex stent patent with 60% stenosis after the stent, RCA 99%, EF 60-65%.  His RCA was treated with a bare-metal stent  . Tobacco abuse, in remission   . Renal carcinoma   . COPD (chronic obstructive pulmonary disease)    Past Surgical History  Procedure Laterality Date  . Hip relacement    . Kidney surgery    . Colon surgery    . Left heart catheterization with coronary angiogram N/A 02/23/2011    Procedure: LEFT HEART CATHETERIZATION WITH CORONARY  ANGIOGRAM;  Surgeon: [name], MD;  Location: [facility] CATH LAB;  Service: Cardiovascular;  Laterality: N/A;  . Percutaneous coronary stent intervention (pci-s) N/A 02/23/2011    Procedure: PERCUTANEOUS CORONARY STENT INTERVENTION (PCI-S);  Surgeon: [name], MD;  Location: [facility] CATH LAB;  Service: Cardiovascular;  Laterality: N/A;  . Temporary pacemaker insertion N/A 02/23/2011    Procedure: TEMPORARY PACEMAKER INSERTION;  Surgeon: [name], MD;  Location: [facility] CATH LAB;  Service: Cardiovascular;  Laterality: N/A;  . Left heart catheterization with coronary angiogram N/A 03/18/2011    Procedure: LEFT HEART CATHETERIZATION WITH CORONARY ANGIOGRAM;  Surgeon: [name], MD;  Location: [facility] CATH LAB;  Service: Cardiovascular;  Laterality: N/A;   No family history on file. Social History  Substance Use Topics  . Smoking status: Former Smoker    Types: Cigarettes    Quit date: 02/24/2007  . Smokeless tobacco: Never Used  . Alcohol Use: Yes     Comment: socially     Review of Systems  Constitutional: Positive for appetite change.  HENT: Positive for sore throat and trouble swallowing.   Respiratory: Positive for shortness of breath.   Neurological: Negative for headaches.  All other systems reviewed and are negative.   Allergies  Influenza vaccines and Bee venom  Home Medications   Prior to Admission medications   Medication Sig Start Date End Date Taking? Authorizing Provider  amLODipine (NORVASC) 10 MG  tablet Take 10 mg by mouth daily.    Yes Historical Provider, MD  ARTIFICIAL TEAR OP Place 1 drop into both eyes 2 (two) times daily as needed (dry eyes).    Yes Historical Provider, MD  aspirin EC 81 MG tablet Take 81 mg by mouth daily.     Yes Historical Provider, MD  baclofen (LIORESAL) 10 MG tablet Take 10 mg by mouth at bedtime as needed (sleep).    Yes Historical Provider, MD  clopidogrel (PLAVIX) 75 MG tablet Take 75 mg by mouth daily.   Yes Historical Provider, MD   cyanocobalamin 500 MCG tablet Take 500 mcg by mouth daily.   Yes Historical Provider, MD  docusate sodium (COLACE) 100 MG capsule Take 100 mg by mouth 2 (two) times daily.   Yes Historical Provider, MD  enalapril (VASOTEC) 20 MG tablet Take 20 mg by mouth at bedtime.    Yes Historical Provider, MD  EPINEPHrine (EPI-PEN) 0.3 mg/0.3 mL DEVI Inject 0.3 mg into the muscle once.   Yes Historical Provider, MD  gabapentin (NEURONTIN) 300 MG capsule Take 300-600 mg by mouth 3 (three) times daily. Takes one capsule in the morning and one at noon, then takes two capsules at bedtime.   Yes Historical Provider, MD  HYDROcodone-acetaminophen (NORCO) 10-325 MG tablet Take 1 tablet by mouth 2 (two) times daily as needed.   Yes Historical Provider, MD  Ipratropium-Albuterol (COMBIVENT) 20-100 MCG/ACT AERS respimat Inhale 1 puff into the lungs 4 (four) times daily.   Yes Historical Provider, MD  metoprolol tartrate (LOPRESSOR) 12.5 mg TABS Take 0.5 tablets (12.5 mg total) by mouth 2 (two) times daily. 02/25/11  Yes [name], PA-C  mometasone (ASMANEX) 220 MCG/INH inhaler Inhale 1 puff into the lungs at bedtime.     Yes Historical Provider, MD  Multiple Vitamin (MULTIVITAMIN) tablet Take 1 tablet by mouth daily.   Yes Historical Provider, MD  nitroGLYCERIN (NITROSTAT) 0.4 MG SL tablet Place 0.4 mg under the tongue every 5 (five) minutes as needed for chest pain.   Yes Historical Provider, MD  Omega-3 Fatty Acids (FISH OIL CONCENTRATE) 1000 MG CAPS Take 1,000 mg by mouth 2 (two) times daily.   Yes Historical Provider, MD  pravastatin (PRAVACHOL) 40 MG tablet Take 40 mg by mouth at bedtime.   Yes Historical Provider, MD  ranitidine (ZANTAC) 300 MG tablet Take 300 mg by mouth at bedtime.   Yes Historical Provider, MD  guaiFENesin (MUCINEX) 600 MG 12 hr tablet Take 2 tablets (1,200 mg total) by mouth 2 (two) times daily. Patient not taking: Reported on 08/09/2015 04/09/14   Velta Addison Mikhail, DO  levofloxacin  (LEVAQUIN) 500 MG tablet Take 1 tablet (500 mg total) by mouth daily. Patient not taking: Reported on 08/09/2015 04/09/14   Maryann Mikhail, DO   BP 155/81 mmHg  Pulse 91  Temp(Src) 98.1 F (36.7 C) (Oral)  Resp 20  SpO2 96% Physical Exam  Constitutional: He is oriented to person, place, and time. He appears well-developed and well-nourished. No distress.  HENT:  Head: Normocephalic and atraumatic.  Right Ear: Hearing normal.  Left Ear: Hearing normal.  Nose: Nose normal.  Mouth/Throat: Oropharynx is clear and moist and mucous membranes are normal.  Eyes: Conjunctivae and EOM are normal. Pupils are equal, round, and reactive to light.  Neck: Normal range of motion. Neck supple.  Cardiovascular: Regular rhythm, S1 normal and S2 normal.  Exam reveals no gallop and no friction rub.   No murmur heard. Pulmonary/Chest: Effort  normal and breath sounds normal. No respiratory distress. He exhibits no tenderness.  Abdominal: Soft. Normal appearance and bowel sounds are normal. There is no hepatosplenomegaly. There is no tenderness. There is no rebound, no guarding, no tenderness at McBurney's point and negative Murphy's sign. No hernia.  Musculoskeletal: Normal range of motion.  Neurological: He is alert and oriented to person, place, and time. He has normal strength. No cranial nerve deficit or sensory deficit. Coordination normal. GCS eye subscore is 4. GCS verbal subscore is 5. GCS motor subscore is 6.  Skin: Skin is warm, dry and intact. No rash noted. No cyanosis.  Psychiatric: He has a normal mood and affect. His speech is normal and behavior is normal. Thought content normal.  Nursing note and vitals reviewed.   ED Course  Procedures (including critical care time) DIAGNOSTIC STUDIES: Oxygen Saturation is 96% on RA, normal by my interpretation.    COORDINATION OF CARE: 11:16 PM-Discussed treatment plan with pt at bedside and pt agreed to plan.   Labs Review Labs Reviewed  BASIC  METABOLIC PANEL - Abnormal; Notable for the following:    Glucose, Bld 106 (*)    All other components within normal limits  CBC WITH DIFFERENTIAL/PLATELET    ED ECG REPORT   Date: 08/09/2015  Rate: 80  Rhythm: normal sinus rhythm  QRS Axis: normal  Intervals: normal  ST/T Wave abnormalities: nonspecific ST changes  Conduction Disutrbances:none  Narrative Interpretation:   Old EKG Reviewed: unchanged  I have personally reviewed the EKG tracing and agree with the computerized printout as noted.   MDM   Final diagnoses:  None  Dysphagia  Patient presents to the emergency department with difficulty swallowing. He was difficult to get the patient to adequately describe to me what he was feeling. He reports that he is having difficulty swallowing solids and liquids. At first it seemed that he was saying that he had a piece of bread stuck in his throat, but ultimately he conveyed to me that everything he swallows feels like it's getting stuck, but it does not. He is able to swallow it, nothing comes back up. He had a headache yesterday. CT scan performed to evaluate for bleed or other abnormalities that would cause difficulty with swallowing. CT head was negative. Soft tissue neck was negative. Lab work was unremarkable. Patient reevaluated and further history revealed the fact that he felt like there was an object in his throat when he swallows. This was therefore concern for possible mass. Patient sent back to radiology for CT scan of neck. There is no mass or other abnormality noted in the oropharyngeal area or upper esophagus.  Nose also been describing symptoms of feeling like he has a burning in his upper abdomen. I suspect that he has significant reflux and is experiencing dysphagia secondary to this. He will require GI evaluation. Patient will be placed on empiric treatment.He is already on Zantac and Protonix. Will increase Protonix to twice a day. Add Carafate.  I personally  performed the services described in this documentation, which was scribed in my presence. The recorded information has been reviewed and is accurate.    Orpah Greek, MD 08/09/15 [place], MD 08/09/15 [phone]

## 2015-08-08 NOTE — ED Notes (Addendum)
Pt states that he started having a low grade fever at home last night but feels like he has a scratchy throat and feels like he has difficulty swallowing. Runny nose this morning. Also has been unable to take his meds, including his zantac. Alert and oriented.

## 2015-08-09 ENCOUNTER — Emergency Department (HOSPITAL_COMMUNITY): Payer: Medicare Other

## 2015-08-09 ENCOUNTER — Encounter (HOSPITAL_COMMUNITY): Payer: Self-pay

## 2015-08-09 DIAGNOSIS — J029 Acute pharyngitis, unspecified: Secondary | ICD-10-CM | POA: Diagnosis not present

## 2015-08-09 DIAGNOSIS — R51 Headache: Secondary | ICD-10-CM | POA: Diagnosis not present

## 2015-08-09 DIAGNOSIS — T17208A Unspecified foreign body in pharynx causing other injury, initial encounter: Secondary | ICD-10-CM | POA: Diagnosis not present

## 2015-08-09 DIAGNOSIS — M5481 Occipital neuralgia: Secondary | ICD-10-CM | POA: Diagnosis not present

## 2015-08-09 LAB — CBC WITH DIFFERENTIAL/PLATELET
Basophils Absolute: 0 10*3/uL (ref 0.0–0.1)
Basophils Relative: 0 %
Eosinophils Absolute: 0.1 10*3/uL (ref 0.0–0.7)
Eosinophils Relative: 2 %
HEMATOCRIT: 41.1 % (ref 39.0–52.0)
Hemoglobin: 14.1 g/dL (ref 13.0–17.0)
LYMPHS ABS: 1.7 10*3/uL (ref 0.7–4.0)
LYMPHS PCT: 24 %
MCH: 31.3 pg (ref 26.0–34.0)
MCHC: 34.3 g/dL (ref 30.0–36.0)
MCV: 91.1 fL (ref 78.0–100.0)
Monocytes Absolute: 0.8 10*3/uL (ref 0.1–1.0)
Monocytes Relative: 11 %
NEUTROS ABS: 4.5 10*3/uL (ref 1.7–7.7)
Neutrophils Relative %: 63 %
Platelets: 322 10*3/uL (ref 150–400)
RBC: 4.51 MIL/uL (ref 4.22–5.81)
RDW: 13.3 % (ref 11.5–15.5)
WBC: 7.1 10*3/uL (ref 4.0–10.5)

## 2015-08-09 LAB — BASIC METABOLIC PANEL
ANION GAP: 7 (ref 5–15)
BUN: 12 mg/dL (ref 6–20)
CHLORIDE: 108 mmol/L (ref 101–111)
CO2: 23 mmol/L (ref 22–32)
Calcium: 9.6 mg/dL (ref 8.9–10.3)
Creatinine, Ser: 1.06 mg/dL (ref 0.61–1.24)
GFR calc Af Amer: >60 mL/min (ref >60)
GFR calc non Af Amer: >60 mL/min (ref >60)
GLUCOSE: 106 mg/dL — AB (ref 65–99)
POTASSIUM: 3.6 mmol/L (ref 3.5–5.1)
Sodium: 138 mmol/L (ref 135–145)

## 2015-08-09 MED ORDER — HYDROCODONE-ACETAMINOPHEN 7.5-325 MG/15ML PO SOLN: 10.0000 mL | Freq: Four times a day (QID) | ORAL | Status: DC | PRN

## 2015-08-09 MED ORDER — PANTOPRAZOLE SODIUM 20 MG PO TBEC: 20.0000 mg | DELAYED_RELEASE_TABLET | Freq: Two times a day (BID) | ORAL | Status: DC

## 2015-08-09 MED ORDER — SUCRALFATE 1 GM/10ML PO SUSP: 1.0000 g | Freq: Three times a day (TID) | ORAL | Status: DC

## 2015-08-09 MED ORDER — HYDROCODONE-ACETAMINOPHEN 7.5-325 MG/15ML PO SOLN
10.0000 mL | Freq: Once | ORAL | Status: AC
  Administered 2015-08-09: 10 mL via ORAL
  Filled 2015-08-09: qty 15

## 2015-08-09 MED ORDER — IOPAMIDOL (ISOVUE-300) INJECTION 61%
75.0000 mL | Freq: Once | INTRAVENOUS | Status: AC | PRN
  Administered 2015-08-09: 75 mL via INTRAVENOUS

## 2015-08-09 NOTE — ED Notes (Signed)
Pt in CT at this time.

## 2015-08-09 NOTE — ED Notes (Signed)
Patient transported to X-ray 

## 2015-08-09 NOTE — Discharge Instructions (Signed)

## 2015-08-09 NOTE — ED Notes (Signed)
Pt able to tolerate drinking fluids.  Pt showed no signs of choking or distress.  Breath sounds clear at this time.

## 2015-08-18 ENCOUNTER — Encounter (HOSPITAL_COMMUNITY): Payer: Self-pay | Admitting: *Deleted

## 2015-08-18 ENCOUNTER — Inpatient Hospital Stay (HOSPITAL_COMMUNITY)
Admission: EM | Admit: 2015-08-18 | Discharge: 2015-08-22 | DRG: 394 | Disposition: A | Discharge status: Home / Self Care | Payer: Medicare Other | Attending: Internal Medicine | Admitting: Internal Medicine

## 2015-08-18 DIAGNOSIS — I6529 Occlusion and stenosis of unspecified carotid artery: Secondary | ICD-10-CM | POA: Diagnosis present

## 2015-08-18 DIAGNOSIS — K219 Gastro-esophageal reflux disease without esophagitis: Secondary | ICD-10-CM | POA: Diagnosis present

## 2015-08-18 DIAGNOSIS — K529 Noninfective gastroenteritis and colitis, unspecified: Secondary | ICD-10-CM | POA: Diagnosis present

## 2015-08-18 DIAGNOSIS — K625 Hemorrhage of anus and rectum: Secondary | ICD-10-CM | POA: Diagnosis not present

## 2015-08-18 DIAGNOSIS — R634 Abnormal weight loss: Secondary | ICD-10-CM | POA: Diagnosis present

## 2015-08-18 DIAGNOSIS — D126 Benign neoplasm of colon, unspecified: Secondary | ICD-10-CM | POA: Diagnosis present

## 2015-08-18 DIAGNOSIS — R1310 Dysphagia, unspecified: Secondary | ICD-10-CM | POA: Diagnosis not present

## 2015-08-18 DIAGNOSIS — Z9049 Acquired absence of other specified parts of digestive tract: Secondary | ICD-10-CM

## 2015-08-18 DIAGNOSIS — J449 Chronic obstructive pulmonary disease, unspecified: Secondary | ICD-10-CM | POA: Diagnosis not present

## 2015-08-18 DIAGNOSIS — K621 Rectal polyp: Secondary | ICD-10-CM | POA: Diagnosis present

## 2015-08-18 DIAGNOSIS — Z7902 Long term (current) use of antithrombotics/antiplatelets: Secondary | ICD-10-CM

## 2015-08-18 DIAGNOSIS — I739 Peripheral vascular disease, unspecified: Secondary | ICD-10-CM | POA: Diagnosis present

## 2015-08-18 DIAGNOSIS — D123 Benign neoplasm of transverse colon: Secondary | ICD-10-CM | POA: Diagnosis present

## 2015-08-18 DIAGNOSIS — Z955 Presence of coronary angioplasty implant and graft: Secondary | ICD-10-CM

## 2015-08-18 DIAGNOSIS — I252 Old myocardial infarction: Secondary | ICD-10-CM

## 2015-08-18 DIAGNOSIS — J432 Centrilobular emphysema: Secondary | ICD-10-CM | POA: Diagnosis present

## 2015-08-18 DIAGNOSIS — Z7951 Long term (current) use of inhaled steroids: Secondary | ICD-10-CM

## 2015-08-18 DIAGNOSIS — K5730 Diverticulosis of large intestine without perforation or abscess without bleeding: Secondary | ICD-10-CM | POA: Diagnosis not present

## 2015-08-18 DIAGNOSIS — K921 Melena: Secondary | ICD-10-CM

## 2015-08-18 DIAGNOSIS — C649 Malignant neoplasm of unspecified kidney, except renal pelvis: Secondary | ICD-10-CM | POA: Diagnosis present

## 2015-08-18 DIAGNOSIS — Z887 Allergy status to serum and vaccine status: Secondary | ICD-10-CM

## 2015-08-18 DIAGNOSIS — I10 Essential (primary) hypertension: Secondary | ICD-10-CM | POA: Diagnosis present

## 2015-08-18 DIAGNOSIS — Z91030 Bee allergy status: Secondary | ICD-10-CM

## 2015-08-18 DIAGNOSIS — K648 Other hemorrhoids: Secondary | ICD-10-CM | POA: Diagnosis not present

## 2015-08-18 DIAGNOSIS — D62 Acute posthemorrhagic anemia: Secondary | ICD-10-CM | POA: Diagnosis not present

## 2015-08-18 DIAGNOSIS — Z8673 Personal history of transient ischemic attack (TIA), and cerebral infarction without residual deficits: Secondary | ICD-10-CM

## 2015-08-18 DIAGNOSIS — E785 Hyperlipidemia, unspecified: Secondary | ICD-10-CM | POA: Diagnosis present

## 2015-08-18 DIAGNOSIS — Z7982 Long term (current) use of aspirin: Secondary | ICD-10-CM

## 2015-08-18 DIAGNOSIS — Z87891 Personal history of nicotine dependence: Secondary | ICD-10-CM

## 2015-08-18 DIAGNOSIS — I2510 Atherosclerotic heart disease of native coronary artery without angina pectoris: Secondary | ICD-10-CM | POA: Diagnosis present

## 2015-08-18 DIAGNOSIS — E7849 Other hyperlipidemia: Secondary | ICD-10-CM | POA: Diagnosis present

## 2015-08-18 DIAGNOSIS — Z85528 Personal history of other malignant neoplasm of kidney: Secondary | ICD-10-CM

## 2015-08-18 HISTORY — DX: Unspecified osteoarthritis, unspecified site: M19.90

## 2015-08-18 HISTORY — DX: Occlusion and stenosis of unspecified carotid artery: I65.29

## 2015-08-18 HISTORY — DX: Hemorrhage of anus and rectum: K62.5

## 2015-08-18 HISTORY — DX: Diverticulitis of intestine, part unspecified, without perforation or abscess without bleeding: K57.92

## 2015-08-18 LAB — TYPE AND SCREEN
ABO/RH(D): O POS
Antibody Screen: NEGATIVE

## 2015-08-18 LAB — COMPREHENSIVE METABOLIC PANEL
ALBUMIN: 4.2 g/dL (ref 3.5–5.0)
ALT: 23 U/L (ref 17–63)
AST: 25 U/L (ref 15–41)
Alkaline Phosphatase: 57 U/L (ref 38–126)
Anion gap: 8 (ref 5–15)
BUN: 8 mg/dL (ref 6–20)
CHLORIDE: 109 mmol/L (ref 101–111)
CO2: 23 mmol/L (ref 22–32)
CREATININE: 1.11 mg/dL (ref 0.61–1.24)
Calcium: 9.7 mg/dL (ref 8.9–10.3)
GFR calc Af Amer: >60 mL/min (ref >60)
GLUCOSE: 95 mg/dL (ref 65–99)
POTASSIUM: 3.9 mmol/L (ref 3.5–5.1)
SODIUM: 140 mmol/L (ref 135–145)
TOTAL PROTEIN: 7.5 g/dL (ref 6.5–8.1)
Total Bilirubin: 0.4 mg/dL (ref 0.3–1.2)

## 2015-08-18 LAB — CBC
HEMATOCRIT: 42.6 % (ref 39.0–52.0)
Hemoglobin: 14.1 g/dL (ref 13.0–17.0)
MCH: 30.1 pg (ref 26.0–34.0)
MCHC: 33.1 g/dL (ref 30.0–36.0)
MCV: 90.8 fL (ref 78.0–100.0)
Platelets: 366 10*3/uL (ref 150–400)
RBC: 4.69 MIL/uL (ref 4.22–5.81)
RDW: 12.8 % (ref 11.5–15.5)
WBC: 8.4 10*3/uL (ref 4.0–10.5)

## 2015-08-18 LAB — LIPASE, BLOOD: LIPASE: 31 U/L (ref 11–51)

## 2015-08-18 LAB — POC OCCULT BLOOD, ED: FECAL OCCULT BLD: POSITIVE — AB

## 2015-08-18 MED ORDER — GI COCKTAIL ~~LOC~~
30.0000 mL | Freq: Once | ORAL | Status: AC
  Administered 2015-08-18: 30 mL via ORAL
  Filled 2015-08-18: qty 30

## 2015-08-18 MED ORDER — FAMOTIDINE IN NACL 20-0.9 MG/50ML-% IV SOLN
20.0000 mg | Freq: Once | INTRAVENOUS | Status: AC
  Administered 2015-08-18: 20 mg via INTRAVENOUS
  Filled 2015-08-18: qty 50

## 2015-08-18 NOTE — ED Notes (Signed)
Pt reports upper abd pains for over a week, has been to [facility] on 6/15 for same. Pt now having bright red blood in stools, reports clots being present. Denies vomiting or diarrhea.

## 2015-08-18 NOTE — ED Provider Notes (Signed)
CSN: [id number]     Arrival date & time 08/18/15  1741 History   First MD Initiated Contact with Patient 08/18/15 2128     Chief Complaint  Patient presents with  . Abdominal Pain  . Blood In Stools     (Consider location/radiation/quality/duration/timing/severity/associated sxs/prior Treatment) HPI Comments: Patient presents with abdominal pain and rectal bleeding ongoing since yesterday. States she "always has blood in his stools" but has become more pronounced over the past day. He's had multiple episodes of blood in the toilet bowl today. There is no stool. States he is passing straight blood with clots and red with wiping. Denies any vomiting but has had nausea. No chest pain or shortness of breath. Endorses some dizziness with standing. Was seen in the ED 10 days ago with dysphagia with a negative workup. Was thought to have gastritis and possible GERD. He is on aspirin and Plavix but no other blood thinners. Reports having a colonoscopy many years ago at the [place] that led to a complication requiring a bowel resection due to bleeding.  The history is provided by the patient.    Past Medical History  Diagnosis Date  . Hypertension   . CVA (cerebral infarction) 2011    Right cerebral; total obstruction of the right ICA  . Coronary artery disease     S/p PCI 2011;  NSTEMI 12/12:  LHC/PCI 02/23/11: LAD 60% after the septal perforator, D1 occluded with distal collaterals, proximal RI 30-40%, AV circumflex stent patent with 60% stenosis after the stent, RCA 99%, EF 60-65%.  His RCA was treated with a bare-metal stent  . Tobacco abuse, in remission   . Renal carcinoma ([place])   . COPD (chronic obstructive pulmonary disease) [facility])    Past Surgical History  Procedure Laterality Date  . Hip relacement    . Kidney surgery    . Colon surgery    . Left heart catheterization with coronary angiogram N/A 02/23/2011    Procedure: LEFT HEART CATHETERIZATION WITH CORONARY ANGIOGRAM;  Surgeon: [name], MD;  Location: [facility] CATH LAB;  Service: Cardiovascular;  Laterality: N/A;  . Percutaneous coronary stent intervention (pci-s) N/A 02/23/2011    Procedure: PERCUTANEOUS CORONARY STENT INTERVENTION (PCI-S);  Surgeon: [name], MD;  Location: [facility] CATH LAB;  Service: Cardiovascular;  Laterality: N/A;  . Temporary pacemaker insertion N/A 02/23/2011    Procedure: TEMPORARY PACEMAKER INSERTION;  Surgeon: [name], MD;  Location: [facility] CATH LAB;  Service: Cardiovascular;  Laterality: N/A;  . Left heart catheterization with coronary angiogram N/A 03/18/2011    Procedure: LEFT HEART CATHETERIZATION WITH CORONARY ANGIOGRAM;  Surgeon: [name], MD;  Location: [facility] CATH LAB;  Service: Cardiovascular;  Laterality: N/A;   History reviewed. No pertinent family history. Social History  Substance Use Topics  . Smoking status: Former Smoker    Types: Cigarettes    Quit date: 02/24/2007  . Smokeless tobacco: Never Used  . Alcohol Use: Yes     Comment: socially     Review of Systems  Constitutional: Positive for activity change and appetite change. Negative for fever and fatigue.  HENT: Negative for congestion and rhinorrhea.   Respiratory: Negative for cough, chest tightness and shortness of breath.   Cardiovascular: Negative for chest pain.  Gastrointestinal: Positive for vomiting, blood in stool and rectal pain. Negative for abdominal pain.  Genitourinary: Negative for dysuria, hematuria, decreased urine volume and testicular pain.  Musculoskeletal: Negative for myalgias and arthralgias.  Skin: Negative for rash.  Neurological: Negative for dizziness, weakness and headaches.  A complete 10 system review of systems was obtained and all systems are negative except as noted in the HPI and PMH.      Allergies  Bee venom and Influenza vaccines  Home Medications   Prior to Admission medications   Medication Sig Start Date End Date Taking? Authorizing Provider  amLODipine (NORVASC)  10 MG tablet Take 10 mg by mouth daily.     Historical Provider, MD  ARTIFICIAL TEAR OP Place 1 drop into both eyes 2 (two) times daily as needed (dry eyes).     Historical Provider, MD  aspirin EC 81 MG tablet Take 81 mg by mouth daily.      Historical Provider, MD  baclofen (LIORESAL) 10 MG tablet Take 10 mg by mouth at bedtime as needed (sleep).     Historical Provider, MD  clopidogrel (PLAVIX) 75 MG tablet Take 75 mg by mouth daily.    Historical Provider, MD  cyanocobalamin 500 MCG tablet Take 500 mcg by mouth daily.    Historical Provider, MD  docusate sodium (COLACE) 100 MG capsule Take 100 mg by mouth 2 (two) times daily.    Historical Provider, MD  enalapril (VASOTEC) 20 MG tablet Take 20 mg by mouth at bedtime.     Historical Provider, MD  EPINEPHrine (EPI-PEN) 0.3 mg/0.3 mL DEVI Inject 0.3 mg into the muscle once.    Historical Provider, MD  gabapentin (NEURONTIN) 300 MG capsule Take 300-600 mg by mouth 3 (three) times daily. Takes one capsule in the morning and one at noon, then takes two capsules at bedtime.    Historical Provider, MD  guaiFENesin (MUCINEX) 600 MG 12 hr tablet Take 2 tablets (1,200 mg total) by mouth 2 (two) times daily. Patient not taking: Reported on 08/09/2015 04/09/14   Velta Addison Mikhail, DO  HYDROcodone-acetaminophen (HYCET) 7.5-325 mg/15 ml solution Take 10 mLs by mouth 4 (four) times daily as needed for moderate pain. 08/09/15   Orpah Greek, MD  HYDROcodone-acetaminophen (NORCO) 10-325 MG tablet Take 1 tablet by mouth 2 (two) times daily as needed.    Historical Provider, MD  Ipratropium-Albuterol (COMBIVENT) 20-100 MCG/ACT AERS respimat Inhale 1 puff into the lungs 4 (four) times daily.    Historical Provider, MD  levofloxacin (LEVAQUIN) 500 MG tablet Take 1 tablet (500 mg total) by mouth daily. Patient not taking: Reported on 08/09/2015 04/09/14   Velta Addison Mikhail, DO  metoprolol tartrate (LOPRESSOR) 12.5 mg TABS Take 0.5 tablets (12.5 mg total) by mouth 2  (two) times daily. 02/25/11   [name], PA-C  mometasone (ASMANEX) 220 MCG/INH inhaler Inhale 1 puff into the lungs at bedtime.      Historical Provider, MD  Multiple Vitamin (MULTIVITAMIN) tablet Take 1 tablet by mouth daily.    Historical Provider, MD  nitroGLYCERIN (NITROSTAT) 0.4 MG SL tablet Place 0.4 mg under the tongue every 5 (five) minutes as needed for chest pain.    Historical Provider, MD  Omega-3 Fatty Acids (FISH OIL CONCENTRATE) 1000 MG CAPS Take 1,000 mg by mouth 2 (two) times daily.    Historical Provider, MD  pantoprazole (PROTONIX) 20 MG tablet Take 1 tablet (20 mg total) by mouth 2 (two) times daily before a meal. 08/09/15   Orpah Greek, MD  pravastatin (PRAVACHOL) 40 MG tablet Take 40 mg by mouth at bedtime.    Historical Provider, MD  ranitidine (ZANTAC) 300 MG tablet Take 300 mg by mouth at bedtime.    Historical  Provider, MD  sucralfate (CARAFATE) 1 GM/10ML suspension Take 10 mLs (1 g total) by mouth 4 (four) times daily -  with meals and at bedtime. 08/09/15   Orpah Greek, MD   BP 167/82 mmHg  Pulse 76  Temp(Src) 97.8 F (36.6 C) (Oral)  Resp 17  SpO2 94% Physical Exam  Constitutional: He is oriented to person, place, and time. He appears well-developed and well-nourished. No distress.  HENT:  Head: Normocephalic and atraumatic.  Mouth/Throat: Oropharynx is clear and moist. No oropharyngeal exudate.  Eyes: Conjunctivae and EOM are normal. Pupils are equal, round, and reactive to light.  Neck: Normal range of motion. Neck supple.  No meningismus.  Cardiovascular: Normal rate, regular rhythm, normal heart sounds and intact distal pulses.   No murmur heard. Pulmonary/Chest: Effort normal and breath sounds normal. No respiratory distress.  Abdominal: Soft. He exhibits distension. There is tenderness. There is no rebound and no guarding.  Diffuse abdominal tenderness, worse in the epigastrium. Midline incision well-healed.  Genitourinary:  No  gross blood. No visible hemorrhoids or anal fissures.  Musculoskeletal: Normal range of motion. He exhibits no edema or tenderness.  Neurological: He is alert and oriented to person, place, and time. No cranial nerve deficit. He exhibits normal muscle tone. Coordination normal.  No ataxia on finger to nose bilaterally. No pronator drift. 5/5 strength throughout. CN 2-12 intact.Equal grip strength. Sensation intact.   Skin: Skin is warm. No rash noted.  Psychiatric: He has a normal mood and affect. His behavior is normal.  Nursing note and vitals reviewed.   ED Course  Procedures (including critical care time) Labs Review Labs Reviewed  POC OCCULT BLOOD, ED - Abnormal; Notable for the following:    Fecal Occult Bld POSITIVE (*)    All other components within normal limits  COMPREHENSIVE METABOLIC PANEL  CBC  LIPASE, BLOOD  TROPONIN I  TYPE AND SCREEN    Imaging Review No results found. I have personally reviewed and evaluated these images and lab results as part of my medical decision-making.   EKG Interpretation None      MDM   Final diagnoses:  None   Patient with abdominal pain and rectal bleeding ongoing over the past 2 days. He is on aspirin and Plavix. States he's been passing straight blood today. He is hemodynamically stable.  Hemoglobin is stable. Hemoccult is positive but no gross blood seen, no evidence of melena. Orthostatics are negative.  Patient is well-appearing. Given his ongoing abdominal pain we'll obtain CT scan. SUspect diverticular or hemorrhoidal bleeding.  Denies melena. No history of ulcers. Denies excessive ETOH or NSAID use.  Care transferred to Dr. Leonides Schanz at shift change. Anticipate observation given his ongoing hematochezia and antiplatelet medication use.   Ezequiel Essex, MD 08/19/15 [phone]

## 2015-08-19 ENCOUNTER — Other Ambulatory Visit: Payer: Self-pay

## 2015-08-19 ENCOUNTER — Emergency Department (HOSPITAL_COMMUNITY): Payer: Medicare Other

## 2015-08-19 ENCOUNTER — Encounter (HOSPITAL_COMMUNITY): Payer: Self-pay | Admitting: Internal Medicine

## 2015-08-19 DIAGNOSIS — K625 Hemorrhage of anus and rectum: Secondary | ICD-10-CM | POA: Insufficient documentation

## 2015-08-19 DIAGNOSIS — K921 Melena: Secondary | ICD-10-CM | POA: Insufficient documentation

## 2015-08-19 DIAGNOSIS — I10 Essential (primary) hypertension: Secondary | ICD-10-CM | POA: Diagnosis not present

## 2015-08-19 DIAGNOSIS — Z91030 Bee allergy status: Secondary | ICD-10-CM | POA: Diagnosis not present

## 2015-08-19 DIAGNOSIS — Z9049 Acquired absence of other specified parts of digestive tract: Secondary | ICD-10-CM | POA: Diagnosis not present

## 2015-08-19 DIAGNOSIS — E785 Hyperlipidemia, unspecified: Secondary | ICD-10-CM | POA: Diagnosis present

## 2015-08-19 DIAGNOSIS — Z7982 Long term (current) use of aspirin: Secondary | ICD-10-CM | POA: Diagnosis not present

## 2015-08-19 DIAGNOSIS — R1310 Dysphagia, unspecified: Secondary | ICD-10-CM | POA: Diagnosis not present

## 2015-08-19 DIAGNOSIS — R634 Abnormal weight loss: Secondary | ICD-10-CM | POA: Diagnosis not present

## 2015-08-19 DIAGNOSIS — Z87891 Personal history of nicotine dependence: Secondary | ICD-10-CM | POA: Diagnosis not present

## 2015-08-19 DIAGNOSIS — Z7902 Long term (current) use of antithrombotics/antiplatelets: Secondary | ICD-10-CM | POA: Diagnosis not present

## 2015-08-19 DIAGNOSIS — K649 Unspecified hemorrhoids: Secondary | ICD-10-CM | POA: Diagnosis not present

## 2015-08-19 DIAGNOSIS — I252 Old myocardial infarction: Secondary | ICD-10-CM | POA: Diagnosis not present

## 2015-08-19 DIAGNOSIS — D123 Benign neoplasm of transverse colon: Secondary | ICD-10-CM | POA: Diagnosis not present

## 2015-08-19 DIAGNOSIS — K5730 Diverticulosis of large intestine without perforation or abscess without bleeding: Secondary | ICD-10-CM | POA: Diagnosis not present

## 2015-08-19 DIAGNOSIS — K621 Rectal polyp: Secondary | ICD-10-CM | POA: Diagnosis not present

## 2015-08-19 DIAGNOSIS — K5790 Diverticulosis of intestine, part unspecified, without perforation or abscess without bleeding: Secondary | ICD-10-CM | POA: Diagnosis not present

## 2015-08-19 DIAGNOSIS — D62 Acute posthemorrhagic anemia: Secondary | ICD-10-CM | POA: Diagnosis not present

## 2015-08-19 DIAGNOSIS — Z955 Presence of coronary angioplasty implant and graft: Secondary | ICD-10-CM | POA: Diagnosis not present

## 2015-08-19 DIAGNOSIS — I2510 Atherosclerotic heart disease of native coronary artery without angina pectoris: Secondary | ICD-10-CM | POA: Diagnosis not present

## 2015-08-19 DIAGNOSIS — I739 Peripheral vascular disease, unspecified: Secondary | ICD-10-CM | POA: Diagnosis present

## 2015-08-19 DIAGNOSIS — Z7951 Long term (current) use of inhaled steroids: Secondary | ICD-10-CM | POA: Diagnosis not present

## 2015-08-19 DIAGNOSIS — I6529 Occlusion and stenosis of unspecified carotid artery: Secondary | ICD-10-CM | POA: Diagnosis not present

## 2015-08-19 DIAGNOSIS — K219 Gastro-esophageal reflux disease without esophagitis: Secondary | ICD-10-CM | POA: Diagnosis not present

## 2015-08-19 DIAGNOSIS — Z85528 Personal history of other malignant neoplasm of kidney: Secondary | ICD-10-CM | POA: Diagnosis not present

## 2015-08-19 DIAGNOSIS — D125 Benign neoplasm of sigmoid colon: Secondary | ICD-10-CM | POA: Diagnosis not present

## 2015-08-19 DIAGNOSIS — Z887 Allergy status to serum and vaccine status: Secondary | ICD-10-CM | POA: Diagnosis not present

## 2015-08-19 DIAGNOSIS — J449 Chronic obstructive pulmonary disease, unspecified: Secondary | ICD-10-CM | POA: Diagnosis not present

## 2015-08-19 DIAGNOSIS — K648 Other hemorrhoids: Secondary | ICD-10-CM | POA: Diagnosis not present

## 2015-08-19 DIAGNOSIS — K529 Noninfective gastroenteritis and colitis, unspecified: Secondary | ICD-10-CM | POA: Diagnosis not present

## 2015-08-19 DIAGNOSIS — Z8673 Personal history of transient ischemic attack (TIA), and cerebral infarction without residual deficits: Secondary | ICD-10-CM | POA: Diagnosis not present

## 2015-08-19 LAB — CBC
HEMATOCRIT: 37.7 % — AB (ref 39.0–52.0)
HEMATOCRIT: 40.4 % (ref 39.0–52.0)
HEMOGLOBIN: 12.5 g/dL — AB (ref 13.0–17.0)
Hemoglobin: 13.4 g/dL (ref 13.0–17.0)
MCH: 29.9 pg (ref 26.0–34.0)
MCH: 30.2 pg (ref 26.0–34.0)
MCHC: 33.2 g/dL (ref 30.0–36.0)
MCHC: 33.2 g/dL (ref 30.0–36.0)
MCV: 90.2 fL (ref 78.0–100.0)
MCV: 91.2 fL (ref 78.0–100.0)
PLATELETS: 361 10*3/uL (ref 150–400)
Platelets: 324 10*3/uL (ref 150–400)
RBC: 4.18 MIL/uL — ABNORMAL LOW (ref 4.22–5.81)
RBC: 4.43 MIL/uL (ref 4.22–5.81)
RDW: 12.9 % (ref 11.5–15.5)
RDW: 13.1 % (ref 11.5–15.5)
WBC: 7 10*3/uL (ref 4.0–10.5)
WBC: 8.1 10*3/uL (ref 4.0–10.5)

## 2015-08-19 LAB — COMPREHENSIVE METABOLIC PANEL
ALBUMIN: 3.6 g/dL (ref 3.5–5.0)
ALT: 19 U/L (ref 17–63)
AST: 23 U/L (ref 15–41)
Alkaline Phosphatase: 46 U/L (ref 38–126)
Anion gap: 6 (ref 5–15)
BILIRUBIN TOTAL: 0.8 mg/dL (ref 0.3–1.2)
BUN: 8 mg/dL (ref 6–20)
CHLORIDE: 109 mmol/L (ref 101–111)
CO2: 24 mmol/L (ref 22–32)
Calcium: 9 mg/dL (ref 8.9–10.3)
Creatinine, Ser: 1.05 mg/dL (ref 0.61–1.24)
GFR calc Af Amer: >60 mL/min (ref >60)
GFR calc non Af Amer: >60 mL/min (ref >60)
GLUCOSE: 89 mg/dL (ref 65–99)
POTASSIUM: 3.5 mmol/L (ref 3.5–5.1)
SODIUM: 139 mmol/L (ref 135–145)
Total Protein: 6.7 g/dL (ref 6.5–8.1)

## 2015-08-19 LAB — PROTIME-INR
INR: 1.2 (ref 0.00–1.49)
Prothrombin Time: 15.3 seconds — ABNORMAL HIGH (ref 11.6–15.2)

## 2015-08-19 LAB — APTT: aPTT: 35 seconds (ref 24–37)

## 2015-08-19 LAB — TROPONIN I

## 2015-08-19 LAB — MRSA PCR SCREENING: MRSA by PCR: NEGATIVE

## 2015-08-19 MED ORDER — DOCUSATE SODIUM 100 MG PO CAPS
100.0000 mg | ORAL_CAPSULE | Freq: Two times a day (BID) | ORAL | Status: DC
  Administered 2015-08-19 – 2015-08-21 (×6): 100 mg via ORAL
  Filled 2015-08-19 (×6): qty 1

## 2015-08-19 MED ORDER — HYDROMORPHONE HCL 1 MG/ML IJ SOLN
0.5000 mg | Freq: Once | INTRAMUSCULAR | Status: AC
  Administered 2015-08-19: 0.5 mg via INTRAVENOUS
  Filled 2015-08-19: qty 1

## 2015-08-19 MED ORDER — MORPHINE SULFATE (PF) 2 MG/ML IV SOLN
1.0000 mg | Freq: Once | INTRAVENOUS | Status: AC
  Administered 2015-08-19: 1 mg via INTRAVENOUS
  Filled 2015-08-19: qty 1

## 2015-08-19 MED ORDER — METOPROLOL TARTRATE 12.5 MG HALF TABLET
12.5000 mg | ORAL_TABLET | Freq: Two times a day (BID) | ORAL | Status: DC
  Administered 2015-08-19 – 2015-08-20 (×3): 12.5 mg via ORAL
  Filled 2015-08-19 (×3): qty 1

## 2015-08-19 MED ORDER — SUCRALFATE 1 GM/10ML PO SUSP
1.0000 g | Freq: Three times a day (TID) | ORAL | Status: DC
  Administered 2015-08-19 – 2015-08-20 (×6): 1 g via ORAL
  Filled 2015-08-19 (×7): qty 10

## 2015-08-19 MED ORDER — ACETAMINOPHEN 650 MG RE SUPP: 650.0000 mg | Freq: Four times a day (QID) | RECTAL | Status: DC | PRN

## 2015-08-19 MED ORDER — IOPAMIDOL (ISOVUE-300) INJECTION 61%
INTRAVENOUS | Status: AC
  Administered 2015-08-19: 100 mL
  Filled 2015-08-19: qty 100

## 2015-08-19 MED ORDER — PRAVASTATIN SODIUM 40 MG PO TABS
40.0000 mg | ORAL_TABLET | Freq: Every day | ORAL | Status: DC
  Administered 2015-08-19 – 2015-08-21 (×3): 40 mg via ORAL
  Filled 2015-08-19 (×3): qty 1

## 2015-08-19 MED ORDER — BUDESONIDE 0.5 MG/2ML IN SUSP
0.5000 mg | Freq: Every day | RESPIRATORY_TRACT | Status: DC
  Administered 2015-08-19 – 2015-08-21 (×3): 0.5 mg via RESPIRATORY_TRACT
  Filled 2015-08-19 (×3): qty 2

## 2015-08-19 MED ORDER — PANTOPRAZOLE SODIUM 40 MG PO TBEC
40.0000 mg | DELAYED_RELEASE_TABLET | Freq: Two times a day (BID) | ORAL | Status: DC
  Administered 2015-08-19 – 2015-08-22 (×6): 40 mg via ORAL
  Filled 2015-08-19 (×6): qty 1

## 2015-08-19 MED ORDER — ACETAMINOPHEN 325 MG PO TABS: 650.0000 mg | ORAL_TABLET | Freq: Four times a day (QID) | ORAL | Status: DC | PRN

## 2015-08-19 MED ORDER — SODIUM CHLORIDE 0.9 % IV SOLN
INTRAVENOUS | Status: DC
  Administered 2015-08-19: 06:00:00 via INTRAVENOUS

## 2015-08-19 MED ORDER — ONDANSETRON HCL 4 MG/2ML IJ SOLN: 4.0000 mg | Freq: Four times a day (QID) | INTRAMUSCULAR | Status: DC | PRN

## 2015-08-19 MED ORDER — IPRATROPIUM-ALBUTEROL 0.5-2.5 (3) MG/3ML IN SOLN: 3.0000 mL | Freq: Four times a day (QID) | RESPIRATORY_TRACT | Status: DC

## 2015-08-19 MED ORDER — TRAMADOL HCL 50 MG PO TABS: 50.0000 mg | ORAL_TABLET | Freq: Four times a day (QID) | ORAL | Status: DC | PRN

## 2015-08-19 MED ORDER — GABAPENTIN 300 MG PO CAPS
300.0000 mg | ORAL_CAPSULE | Freq: Three times a day (TID) | ORAL | Status: DC
  Administered 2015-08-19 – 2015-08-21 (×6): 300 mg via ORAL
  Administered 2015-08-21: 600 mg via ORAL
  Administered 2015-08-21 – 2015-08-22 (×2): 300 mg via ORAL
  Filled 2015-08-19: qty 1
  Filled 2015-08-19: qty 2
  Filled 2015-08-19 (×7): qty 1

## 2015-08-19 MED ORDER — MOMETASONE FUROATE 220 MCG/INH IN AEPB: 1.0000 | INHALATION_SPRAY | Freq: Every day | RESPIRATORY_TRACT | Status: DC

## 2015-08-19 MED ORDER — ASPIRIN 81 MG PO CHEW
81.0000 mg | CHEWABLE_TABLET | Freq: Every day | ORAL | Status: DC
  Administered 2015-08-19 – 2015-08-22 (×4): 81 mg via ORAL
  Filled 2015-08-19 (×4): qty 1

## 2015-08-19 MED ORDER — SODIUM CHLORIDE 0.9% FLUSH
3.0000 mL | Freq: Two times a day (BID) | INTRAVENOUS | Status: DC
  Administered 2015-08-19 – 2015-08-22 (×6): 3 mL via INTRAVENOUS

## 2015-08-19 MED ORDER — FAMOTIDINE IN NACL 20-0.9 MG/50ML-% IV SOLN
20.0000 mg | Freq: Two times a day (BID) | INTRAVENOUS | Status: DC
Start: 2015-08-19 — End: 2015-08-20
  Administered 2015-08-19 (×2): 20 mg via INTRAVENOUS
  Filled 2015-08-19 (×3): qty 50

## 2015-08-19 MED ORDER — OXYCODONE HCL 5 MG PO TABS: 5.0000 mg | ORAL_TABLET | ORAL | Status: DC | PRN

## 2015-08-19 MED ORDER — IPRATROPIUM-ALBUTEROL 0.5-2.5 (3) MG/3ML IN SOLN
3.0000 mL | Freq: Four times a day (QID) | RESPIRATORY_TRACT | Status: DC
  Administered 2015-08-19 – 2015-08-22 (×13): 3 mL via RESPIRATORY_TRACT
  Filled 2015-08-19 (×14): qty 3

## 2015-08-19 MED ORDER — KCL IN DEXTROSE-NACL 20-5-0.9 MEQ/L-%-% IV SOLN
INTRAVENOUS | Status: DC
  Administered 2015-08-19: 1000 mL via INTRAVENOUS
  Administered 2015-08-20: 13:00:00 via INTRAVENOUS
  Filled 2015-08-19 (×4): qty 1000

## 2015-08-19 MED ORDER — PANTOPRAZOLE SODIUM 20 MG PO TBEC
20.0000 mg | DELAYED_RELEASE_TABLET | Freq: Two times a day (BID) | ORAL | Status: DC
  Filled 2015-08-19: qty 1

## 2015-08-19 NOTE — Progress Notes (Signed)
[facility] TEAM 1 - Stepdown/ICU TEAM  Wayne Meadows  [id number] DOB: 1934-09-23 DOA: 08/18/2015 PCP: PROVIDER NOT IN SYSTEM    Brief Narrative:  81 y.o. male, w/ COPD (not on home o2), CAD s/p stent, and CVA who presented w/ c/o bright red rectal bleeding starting 2am on 08/18/2015.  This occurred several times throughout the day and was accompanied by sharp upper abdominal pain + nausea. No emesis. Denied constipation, diarrhea, black stool.  Subjective: Pt is seen for a f/u visit.    Assessment & Plan:  BRBPR  Acute blood loss anemia   D[facility] W[facility]   GERD  CAD - s/p circ stent 2011 holding ASA and Plavix   Hx Renal Cell CA  HTN  COPD  Hx CVA 2011 Known R ICA obstruction   DVT prophylaxis: SCDs Code Status: FULL CODE Family Communication: no family present at time of exam  Disposition Plan:   Consultants:  [facility]   Procedures: none  Antimicrobials:  none   Objective: Blood pressure 168/92, pulse 97, temperature 98.1 F (36.7 C), temperature source Oral, resp. rate 16, height 6\' 2"  (1.88 m), weight 92.5 kg (203 lb 14.8 oz), SpO2 98 %.  Intake/Output Summary (Last 24 hours) at 08/19/15 1156 Last data filed at 08/19/15 0347  Gross per 24 hour  Intake    250 ml  Output    300 ml  Net    -50 ml   Filed Weights   08/19/15 0912  Weight: 92.5 kg (203 lb 14.8 oz)    Examination: Pt was seen for a f/u visit.    CBC:  Recent Labs Lab 08/18/15 2108 08/19/15 0445  WBC 8.4 7.0  HGB 14.1 12.5*  HCT 42.6 37.7*  MCV 90.8 90.2  PLT 366 0000000   Basic Metabolic Panel:  Recent Labs Lab 08/18/15 2108 08/19/15 0445  NA 140 139  K 3.9 3.5  CL 109 109  CO2 23 24  GLUCOSE 95 89  BUN 8 8  CREATININE 1.11 1.05  CALCIUM 9.7 9.0   GFR: Estimated Creatinine Clearance: 67.4 mL/min (by C-G formula based on Cr of 1.05).  Liver Function Tests:  Recent Labs Lab 08/18/15 2108 08/19/15 0445  AST 25 23  ALT 23  19  ALKPHOS 57 46  BILITOT 0.4 0.8  PROT 7.5 6.7  ALBUMIN 4.2 3.6    Recent Labs Lab 08/18/15 2108  LIPASE 31   Cardiac Enzymes:  Recent Labs Lab 08/19/15 0009  TROPONINI <0.03    Recent Results (from the past 240 hour(s))  MRSA PCR Screening     Status: None   Collection Time: 08/19/15  9:00 AM  Result Value Ref Range Status   MRSA by PCR NEGATIVE NEGATIVE Final    Comment:        The GeneXpert MRSA Assay (FDA approved for NASAL specimens only), is one component of a comprehensive MRSA colonization surveillance program. It is not intended to diagnose MRSA infection nor to guide or monitor treatment for MRSA infections.      Scheduled Meds: . budesonide  0.5 mg Nebulization QHS  . docusate sodium  100 mg Oral BID  . famotidine (PEPCID) IV  20 mg Intravenous Q12H  . gabapentin  300-600 mg Oral TID  . ipratropium-albuterol  3 mL Inhalation QID  . metoprolol tartrate  12.5 mg Oral BID  . pantoprazole  40 mg Oral BID AC  . pravastatin  40 mg Oral QHS  .  sodium chloride flush  3 mL Intravenous Q12H  . sucralfate  1 g Oral TID WC & HS   Continuous Infusions: . sodium chloride 75 mL/hr at 08/19/15 0533     LOS: 0 days   Time spent: No Charge  Cherene Altes, MD [facility] Office  [phone] Pager - Text Page per Shea Evans as per below:  On-Call/Text Page:      [email or web address]      password [email or web address]  If 7PM-7AM, please contact night-coverage [email or web address] Password [facility] 08/19/2015, 11:56 AM

## 2015-08-19 NOTE — ED Notes (Signed)
Pt taken to CT.

## 2015-08-19 NOTE — ED Notes (Signed)
Admitting at bedside 

## 2015-08-19 NOTE — Consult Note (Signed)
Consultation  Referring Provider:    Dr. Thereasa Solo  Primary Care Physician:  PROVIDER NOT IN SYSTEM Primary Gastroenterologist:   [facility]  Reason for Consultation:  Rectal bleeding            HPI:   Wayne Meadows is a 81 y.o. male with a past med hx sig for COPD, CAD s/p stent and CVA who presented to the ER this morning, 08/19/15 with a chief complaint of rectal bleeding which started around 2 am on 08/18/15.  At the time of my interview, patient's daughter is present and does assist with this history. The patient describes that around 2:00 yesterday morning he got up to go to the bathroom and when he wiped he saw a large amount of bright red blood on the toilet paper, as well as enough to fill the toilet bowl. This occurred up to 6 times throughout the day yesterday and he was sent to the ER around 6 PM. Since that time the patient has had no further bowel movements. The patient does describe accompanying generalized abdominal pain which at its worst is a 6-7 out of 10. Patient describes this was relieved after a "pain injection" in the ER, though this is "starting to creep back up now". Patient does describe feeling weak and "sick" over the past couple of weeks. He tells me he has also been nauseous over this time with no episodes of vomiting. The patient's last meal was around 1 PM yesterday when he was able to eat some chicken broth. Patient does use a baby aspirin and Plavix daily for his history of carotid stenosis. The last time he took this was on 08/17/15. Patient also describes some weight loss over the past couple of weeks as he has not been able to eat his regular diet. He does report seeing some "small spots of bright red blood, rather smears" when he wipes over the past few years.  Per review patient's chart he was in the ER on 08/08/15 for complaint of dysphagia, CT of the head and neck were done and normal. It was suspected patient had reflux, his Protonix was increased to 40 mg  twice a day and he was told to continue his Ranitidine. The patient tells me he then had his EGD the next day at the [place] which "showed nothing wrong". According to the patient's daughter he is set up for further testing in a week or 2, including what sounds like a swallow study. The patient continues to describe a burning in his stomach which radiates "across the top of it". He also continues with some feeling of dysphagia over the past 2 weeks.  Past medical history is positive for a colonoscopy 4 years ago at the [place] during which they perforated his bowel and he ended up in the hospital for 17 days and had a partial colectomy. We do not have records of this.  Hospital course: hgb 12.5 at 0445 on 08/19/15, down from 14.1 on 08/18/15 at 2108; CT abdomen/pelvis with contrast-extensive colonic diverticulosis, no active inflammatory changes. No bowel obstruction.  Past Medical History  Diagnosis Date  . Hypertension   . CVA (cerebral infarction) 2011    Right cerebral; total obstruction of the right ICA  . Coronary artery disease     S/p PCI 2011;  NSTEMI 12/12:  LHC/PCI 02/23/11: LAD 60% after the septal perforator, D1 occluded with distal collaterals, proximal RI 30-40%, AV circumflex stent patent with 60% stenosis after the  stent, RCA 99%, EF 60-65%.  His RCA was treated with a bare-metal stent  . Tobacco abuse, in remission   . Renal carcinoma ([place])   . COPD (chronic obstructive pulmonary disease) ([place])   . Carotid stenosis     Past Surgical History  Procedure Laterality Date  . Hip relacement    . Kidney surgery    . Colon surgery    . Left heart catheterization with coronary angiogram N/A 02/23/2011    Procedure: LEFT HEART CATHETERIZATION WITH CORONARY ANGIOGRAM;  Surgeon: [name], MD;  Location: [facility] CATH LAB;  Service: Cardiovascular;  Laterality: N/A;  . Percutaneous coronary stent intervention (pci-s) N/A 02/23/2011    Procedure: PERCUTANEOUS CORONARY STENT INTERVENTION (PCI-S);   Surgeon: [name], MD;  Location: [facility] CATH LAB;  Service: Cardiovascular;  Laterality: N/A;  . Temporary pacemaker insertion N/A 02/23/2011    Procedure: TEMPORARY PACEMAKER INSERTION;  Surgeon: [name], MD;  Location: [facility] CATH LAB;  Service: Cardiovascular;  Laterality: N/A;  . Left heart catheterization with coronary angiogram N/A 03/18/2011    Procedure: LEFT HEART CATHETERIZATION WITH CORONARY ANGIOGRAM;  Surgeon: [name], MD;  Location: [facility] CATH LAB;  Service: Cardiovascular;  Laterality: N/A;    Family History  Problem Relation Age of Onset  . Heart attack  84  Patient denies family history of colon cancer, colon polyps, IBD, liver or pancreatic disease, breast or ovarian cancer.  Social History  Substance Use Topics  . Smoking status: Former Smoker    Types: Cigarettes    Quit date: 02/24/2007  . Smokeless tobacco: Never Used  . Alcohol Use: 0.6 oz/week    1 Standard drinks or equivalent per week     Comment: socially     Prior to Admission medications   Medication Sig Start Date End Date Taking? Authorizing Provider  amLODipine (NORVASC) 10 MG tablet Take 10 mg by mouth daily.    Yes Historical Provider, MD  ARTIFICIAL TEAR OP Place 1 drop into both eyes 2 (two) times daily as needed (dry eyes).    Yes Historical Provider, MD  aspirin EC 81 MG tablet Take 81 mg by mouth daily.     Yes Historical Provider, MD  clopidogrel (PLAVIX) 75 MG tablet Take 75 mg by mouth daily.   Yes Historical Provider, MD  cyanocobalamin 500 MCG tablet Take 500 mcg by mouth daily.   Yes Historical Provider, MD  docusate sodium (COLACE) 100 MG capsule Take 100 mg by mouth 2 (two) times daily.   Yes Historical Provider, MD  enalapril (VASOTEC) 20 MG tablet Take 20 mg by mouth at bedtime.    Yes Historical Provider, MD  EPINEPHrine (EPI-PEN) 0.3 mg/0.3 mL DEVI Inject 0.3 mg into the muscle once.   Yes Historical Provider, MD  gabapentin (NEURONTIN) 300 MG capsule Take 300-600 mg by  mouth 3 (three) times daily. Takes one capsule in the morning and one at noon, then takes two capsules at bedtime.   Yes Historical Provider, MD  HYDROcodone-acetaminophen (HYCET) 7.5-325 mg/15 ml solution Take 10 mLs by mouth 4 (four) times daily as needed for moderate pain. 08/09/15  Yes Orpah Greek, MD  HYDROcodone-acetaminophen (NORCO) 10-325 MG tablet Take 1 tablet by mouth 2 (two) times daily as needed for moderate pain.    Yes Historical Provider, MD  Ipratropium-Albuterol (COMBIVENT) 20-100 MCG/ACT AERS respimat Inhale 1 puff into the lungs 4 (four) times daily.   Yes Historical Provider, MD  metoprolol tartrate (LOPRESSOR) 25 MG  tablet Take 12.5 mg by mouth 2 (two) times daily.   Yes Historical Provider, MD  mometasone (ASMANEX) 220 MCG/INH inhaler Inhale 1 puff into the lungs at bedtime.     Yes Historical Provider, MD  Multiple Vitamin (MULTIVITAMIN) tablet Take 1 tablet by mouth daily.   Yes Historical Provider, MD  nitroGLYCERIN (NITROSTAT) 0.4 MG SL tablet Place 0.4 mg under the tongue every 5 (five) minutes as needed for chest pain.   Yes Historical Provider, MD  pantoprazole (PROTONIX) 20 MG tablet Take 1 tablet (20 mg total) by mouth 2 (two) times daily before a meal. 08/09/15  Yes Orpah Greek, MD  pravastatin (PRAVACHOL) 40 MG tablet Take 40 mg by mouth at bedtime.   Yes Historical Provider, MD  ranitidine (ZANTAC) 300 MG tablet Take 300 mg by mouth at bedtime.   Yes Historical Provider, MD  sucralfate (CARAFATE) 1 GM/10ML suspension Take 10 mLs (1 g total) by mouth 4 (four) times daily -  with meals and at bedtime. 08/09/15  Yes Orpah Greek, MD  guaiFENesin (MUCINEX) 600 MG 12 hr tablet Take 2 tablets (1,200 mg total) by mouth 2 (two) times daily. Patient not taking: Reported on 08/09/2015 04/09/14   Velta Addison Mikhail, DO  levofloxacin (LEVAQUIN) 500 MG tablet Take 1 tablet (500 mg total) by mouth daily. Patient not taking: Reported on 08/09/2015 04/09/14    Velta Addison Mikhail, DO  metoprolol tartrate (LOPRESSOR) 12.5 mg TABS Take 0.5 tablets (12.5 mg total) by mouth 2 (two) times daily. 02/25/11   [name], PA-C    Current Facility-Administered Medications  Medication Dose Route Frequency Provider Last Rate Last Dose  . 0.9 %  sodium chloride infusion   Intravenous Continuous Jani Gravel, MD 75 mL/hr at 08/19/15 0533    . acetaminophen (TYLENOL) tablet 650 mg  650 mg Oral Q6H PRN Jani Gravel, MD       Or  . acetaminophen (TYLENOL) suppository 650 mg  650 mg Rectal Q6H PRN Jani Gravel, MD      . budesonide (PULMICORT) nebulizer solution 0.5 mg  0.5 mg Nebulization QHS Wynell Balloon, RPH      . docusate sodium (COLACE) capsule 100 mg  100 mg Oral BID Jani Gravel, MD      . famotidine (PEPCID) IVPB 20 mg premix  20 mg Intravenous Q12H Jani Gravel, MD      . gabapentin (NEURONTIN) capsule 300-600 mg  300-600 mg Oral TID Jani Gravel, MD      . ipratropium-albuterol (DUONEB) 0.5-2.5 (3) MG/3ML nebulizer solution 3 mL  3 mL Inhalation QID Jani Gravel, MD      . metoprolol tartrate (LOPRESSOR) tablet 12.5 mg  12.5 mg Oral BID Jani Gravel, MD      . ondansetron [facility]) injection 4 mg  4 mg Intravenous Q6H PRN Jani Gravel, MD      . pantoprazole (PROTONIX) EC tablet 20 mg  20 mg Oral BID AC Jani Gravel, MD      . pravastatin (PRAVACHOL) tablet 40 mg  40 mg Oral QHS Jani Gravel, MD      . sodium chloride flush (NS) 0.9 % injection 3 mL  3 mL Intravenous Q12H Jani Gravel, MD      . sucralfate (CARAFATE) 1 GM/10ML suspension 1 g  1 g Oral TID WC & HS Jani Gravel, MD        Allergies as of 08/18/2015 - Review Complete 08/18/2015  Allergen Reaction Noted  . Bee venom Anaphylaxis 02/22/2011  .  Influenza vaccines Other (See Comments) 07/13/2013     Review of Systems:    Constitutional: Positive for weight loss and weakness No fever, chills or fatigue HEENT: Eyes: No change in vision               Ears, Nose, Throat:  No change in hearing Skin: No rash or  itching Cardiovascular: No chest pain, chest pressure or chest discomfort. No palpitations or edema       Respiratory: No SOB or cough Gastrointestinal: See HPI and otherwise negative Genitourinary: No dysuria or change in urinary frequency Neurological: No headache, dizziness or syncope Musculoskeletal: Positive for chronic muscle and back pain No new joint pain or stiffness. Hematologic: No history of previous anemia, bleeding or bruising Psychiatric: No history of depression or anxiety   Physical Exam:  Vital signs in last 24 hours: Temp:  [97.8 F (36.6 C)-98.1 F (36.7 C)] 98.1 F (36.7 C) (06/26 0912) Pulse Rate:  [69-96] 80 (06/26 0912) Resp:  [8-18] 16 (06/26 0912) BP: (111-167)/(58-99) 157/68 mmHg (06/26 0912) SpO2:  [94 %-98 %] 97 % (06/26 0912) Weight:  [203 lb 14.8 oz (92.5 kg)] 203 lb 14.8 oz (92.5 kg) (06/26 0912)   General:   Pleasant Caucasian male appears to be in NAD, Well developed, Well nourished, alert and cooperative Head:  Normocephalic and atraumatic. Eyes:   PEERL, EOMI. No icterus. Conjunctiva pink. Ears:  Normal auditory acuity. Neck:  Supple Throat: Oral cavity and pharynx without inflammation, swelling or lesion.  Lungs: Respirations even and unlabored. Lungs clear to auscultation bilaterally.   No wheezes, crackles, or rhonchi.  Heart: Normal S1, S2. No MRG. Regular rate and rhythm. No peripheral edema, cyanosis or pallor.  Abdomen:  Soft, nondistended, moderate analyzed TTP, worse across upper abdomen, No rebound or guarding. Normal bowel sounds. No appreciable masses or hepatomegaly. Incisional scar down midline, umbilical hernia and left sided abdominal wall hernia, easily reducible Rectal:  Not performed.  Msk:  Symmetrical without gross deformities. Peripheral pulses intact.  Extremities:  Without edema, no deformity or joint abnormality. Neurologic:  Alert and  oriented x4;  grossly normal neurologically. Skin:   Dry and intact without  significant lesions or rashes. Psychiatric: Oriented to person, place and time. Demonstrates good judgement and reason without abnormal affect or behaviors.   LAB RESULTS:  Recent Labs  08/18/15 2108 08/19/15 0445  WBC 8.4 7.0  HGB 14.1 12.5*  HCT 42.6 37.7*  PLT 366 324   BMET  Recent Labs  08/18/15 2108 08/19/15 0445  NA 140 139  K 3.9 3.5  CL 109 109  CO2 23 24  GLUCOSE 95 89  BUN 8 8  CREATININE 1.11 1.05  CALCIUM 9.7 9.0   LFT  Recent Labs  08/19/15 0445  PROT 6.7  ALBUMIN 3.6  AST 23  ALT 19  ALKPHOS 46  BILITOT 0.8   PT/INR No results for input(s): LABPROT, INR in the last 72 hours.  STUDIES: Ct Abdomen Pelvis W Contrast  08/19/2015  CLINICAL DATA:  81 year old male with abdominal pain and rectal bleeding EXAM: CT ABDOMEN AND PELVIS WITH CONTRAST TECHNIQUE: Multidetector CT imaging of the abdomen and pelvis was performed using the standard protocol following bolus administration of intravenous contrast. CONTRAST:  145mL ISOVUE-300 IOPAMIDOL (ISOVUE-300) INJECTION 61% COMPARISON:  Abdominal radiograph dated 10/26/2011 and abdominal MRI dated 02/25/2011 FINDINGS: The visualized lung bases are clear. There is coronary vascular calcification. No intra-abdominal free air or free fluid. Cholecystectomy. A 2.2 cm ill-defined right  hepatic lesion with apparent peripheral nodular enhancement is not characterized on this CT but corresponds to the lesion seen on the prior MRI and likely represents a hemangioma. The pancreas, spleen, and the adrenal glands appear unremarkable. There multiple bilateral renal hypodense lesions measuring up to 3.4 cm in the superior pole of the right kidney. The larger lesions represent cysts and the smaller lesions are not well characterized. There is no hydronephrosis on either side. The visualized ureters and urinary bladder appear unremarkable. The prostate and seminal vesicles are grossly unremarkable. There is sigmoid diverticulosis with  muscular hypertrophy. There are scattered colonic diverticula. There is no active inflammatory changes. There is no evidence of bowel obstruction. There is abutment of loops of small bowel to the anterior peritoneal wall and ventral hernia repair mesh compatible with adhesions. There is laxity of the anterior abdominal wall with protrusion of loops of small bowel. There is midline vertical anterior abdominal wall incisional scar. There is osteopenia with degenerative changes of the spine. There is disc space narrowing and anterior wedging and L1-L2. Lower lumbar posterior fixation screws noted. There is a total right hip arthroplasty. No acute fracture or dislocation. IMPRESSION: Extensive colonic diverticulosis. No active inflammatory changes. No bowel obstruction. Electronically Signed   By: Anner Crete M.D.   On: 08/19/2015 01:12     PREVIOUS ENDOSCOPIES:            At the [facility] on 08/09/15-we do not have report, the patient tells me that there was no abnormality. Apparently he is set up for further testing next week including what sounds like a swallow study.   Impression / Plan:  Impression: 1. Hematochezia: Started 08/18/15 at 2am, continued Until 6 PM last night; drop in hgb from 14.1 to 12.5 overnight-Ct shopwing extensive diverticular disease: Consider diverticular bleed vs AVM vs mass vs other 2. Acute blood loss anemia:See above 3. Generalized abdominal pain: Patient describes 2 weeks of epigastric pain and burning and now I generalized abdominal pain-consider GERD versus gastritis versus IBS versus diverticular disease or some other 4. Dysphagia: Patient with recent EGD 2 weeks ago at the [place], normal per patient-requesting report, continues with feelings of dysphagia toward solids foods, patient is set up for further swallow studies and a week or 2 5. GERD: Patient presented to the ED on 08/08/15 with complaint of dysphagia and epigastric pain, at that time Protonix was increased to 40  mg twice a day and patient was told to use ranitidine as well, continues with symptoms, we'll request recent EGD   Plan: 1. Continue supportive measures 2. Serial hgb with transfusion as necessary 3. Patient to remain NPO for now 4. Will request records from recent EGD at [facility] 5. Patient may benefit from colonoscopy in the future, though at this time with no signs of acute active bleeding, we will observe 6. Patient is already set up for further swallow evaluation as an outpatient, unsure if this needs to be pursued during this current hospital stay, at this time increased Protonix to 40mg  BID and continue IV Pepcid BID 7. Will discuss above with Dr. Addison Lank, please await any further recommendations    Thank you for your kind consultation, we will continue to follow.  Lavone Nian [facility]  08/19/2015, 9:23 AM Pager #: [phone]  Addendum: 1500 Per Dr. Silverio Decamp, restarted ASA 81 mg and started pt on clears. Will observe for further signs of acute bleeding. [place]   Attending physician's note   I have taken a  history, examined the patient and reviewed the chart. I agree with the Advanced Practitioner's note, impression and recommendations. 11 yr F with h/o CAD, ?coronoary stents and CVA on Aspirin and Plavix presented with small volume hematochezia. He also has h/o ?colon perforation and is s/p segmental colectomy. Will try to obtain records from [place]. Differential includes hemorrhoidal vs less likely diverticular. Monitor Hgb q12h. Ok to start clears and low dose aspirin given his cardiac disease and also CVA. Will need to discuss with Cardiology if OK to hold Plavix.    Damaris Hippo, MD [phone] Mon-Fri 8a-5p [phone] after 5p, weekends, holidays

## 2015-08-19 NOTE — ED Provider Notes (Signed)
2:00 AM  Assumed care from Dr. Wyvonnia Dusky.  Pt is a 81 y.o. male who presents emergency department with rectal bleeding. Has had several episodes of bright red blood with clots. Is on Plavix and aspirin. Had colonoscopy years ago at the [facility] he reports required bowel resection due to bleeding. Hemodynamically stable in the emergency department. CT of his abdomen shows diverticulosis. Suspect diverticular bleed. Plan was to admit patient for GI bleed.    2:07 AM  Date: 08/19/2015  Rate: 76  Rhythm: normal sinus rhythm  QRS Axis: normal  Intervals: normal  ST/T Wave abnormalities: normal  Conduction Disutrbances: none  Narrative Interpretation: Q waves in anterior leads which is old, minimal ST depression in inferior leads which is old, no new ischemic abnormality    3:00 AM  Discussed patient's case with hospitalist, Dr. Maudie Mercury.  Recommend admission to inpatient, stepdown bed.  I will place holding orders per their request. Patient and family (if present) updated with plan. Care transferred to hospitalist service.   3:10 Am  D/w Dr. Havery Moros on call for gastroenterology. Hospitalist requested I contact GI tonight. I feel patient is stable and can wait to be seen until the morning by gastroenterology. They recommend keeping him on liquid diet only at this time. See patient in consult.    I reviewed all nursing notes, vitals, pertinent old records, EKGs, labs, imaging (as available).   [place], DO 08/19/15 [phone]

## 2015-08-19 NOTE — ED Notes (Signed)
Attempted report to 2C.  

## 2015-08-19 NOTE — H&P (Signed)
TRH H&P   Patient Demographics:    Wayne Meadows, is a 81 y.o. male  MRN: [id number]   DOB - 06-02-1934  Admit Date - 08/18/2015  Outpatient Primary MD for the patient is PROVIDER NOT IN SYSTEM  Dr. Clydene Laming [facility] Jule Ser)  Referring MD/NP/PA: Erasmo Downer Ward  Outpatient Specialists: [name] (cardiologist)  Patient coming from: home  Chief Complaint  Patient presents with  . Abdominal Pain  . Blood In Stools      HPI:    Wayne Meadows  is a 81 y.o. male, w/ Copd(not on home o2), CAD s/p stent, CVA, apparently c/o rectal bleeding starting 2am. 08/18/2015  On the toilet paper and in the toilet.  Occurred several times throughout the day. + abdominal pain " sharp" upper abdomen.  + nausea.  No emesis.  Denies constipation, diarrhea, black stool.   Pt denies fever, chills, lightheadedness, cp, palp, sob.      Review of systems:    In addition to the HPI above,  No Fever-chills, No Headache, No changes with Vision or hearing, No problems swallowing food or Liquids, No Chest pain, Cough or Shortness of Breath, No  Vommitting, Bowel movements are regular, No Blood in stool or Urine, No dysuria, No new skin rashes or bruises, No new joints pains-aches,  No new weakness, tingling, numbness in any extremity, No recent weight gain or loss, No polyuria, polydypsia or polyphagia, No significant Mental Stressors.  A full 10 point Review of Systems was done, except as stated above, all other Review of Systems were negative.   With Past History of the following :    Past Medical History  Diagnosis Date  . Hypertension   . CVA (cerebral infarction) 2011    Right cerebral; total obstruction of the right ICA  . Coronary artery disease     S/p PCI 2011;  NSTEMI 12/12:  LHC/PCI 02/23/11: LAD 60% after the septal perforator, D1 occluded with distal collaterals, proximal  RI 30-40%, AV circumflex stent patent with 60% stenosis after the stent, RCA 99%, EF 60-65%.  His RCA was treated with a bare-metal stent  . Tobacco abuse, in remission   . Renal carcinoma ([place])   . COPD (chronic obstructive pulmonary disease) [facility])       Past Surgical History  Procedure Laterality Date  . Hip relacement    . Kidney surgery    . Colon surgery    . Left heart catheterization with coronary angiogram N/A 02/23/2011    Procedure: LEFT HEART CATHETERIZATION WITH CORONARY ANGIOGRAM;  Surgeon: [name], MD;  Location: [facility] CATH LAB;  Service: Cardiovascular;  Laterality: N/A;  . Percutaneous coronary stent intervention (pci-s) N/A 02/23/2011    Procedure: PERCUTANEOUS CORONARY STENT INTERVENTION (PCI-S);  Surgeon: [name], MD;  Location: [facility] CATH LAB;  Service: Cardiovascular;  Laterality: N/A;  . Temporary pacemaker insertion N/A 02/23/2011  Procedure: TEMPORARY PACEMAKER INSERTION;  Surgeon: [name], MD;  Location: [facility] CATH LAB;  Service: Cardiovascular;  Laterality: N/A;  . Left heart catheterization with coronary angiogram N/A 03/18/2011    Procedure: LEFT HEART CATHETERIZATION WITH CORONARY ANGIOGRAM;  Surgeon: [name], MD;  Location: [facility] CATH LAB;  Service: Cardiovascular;  Laterality: N/A;      Social History:     Social History  Substance Use Topics  . Smoking status: Former Smoker    Types: Cigarettes    Quit date: 02/24/2007  . Smokeless tobacco: Never Used  . Alcohol Use: 0.6 oz/week    1 Standard drinks or equivalent per week     Comment: socially      Lives - at home  Mobility -      Family History :     Family History  Problem Relation Age of Onset  . Heart attack  82      Home Medications:   Prior to Admission medications   Medication Sig Start Date End Date Taking? Authorizing Provider  amLODipine (NORVASC) 10 MG tablet Take 10 mg by mouth daily.    Yes Historical Provider, MD  ARTIFICIAL TEAR OP Place 1 drop  into both eyes 2 (two) times daily as needed (dry eyes).    Yes Historical Provider, MD  aspirin EC 81 MG tablet Take 81 mg by mouth daily.     Yes Historical Provider, MD  clopidogrel (PLAVIX) 75 MG tablet Take 75 mg by mouth daily.   Yes Historical Provider, MD  cyanocobalamin 500 MCG tablet Take 500 mcg by mouth daily.   Yes Historical Provider, MD  docusate sodium (COLACE) 100 MG capsule Take 100 mg by mouth 2 (two) times daily.   Yes Historical Provider, MD  enalapril (VASOTEC) 20 MG tablet Take 20 mg by mouth at bedtime.    Yes Historical Provider, MD  EPINEPHrine (EPI-PEN) 0.3 mg/0.3 mL DEVI Inject 0.3 mg into the muscle once.   Yes Historical Provider, MD  gabapentin (NEURONTIN) 300 MG capsule Take 300-600 mg by mouth 3 (three) times daily. Takes one capsule in the morning and one at noon, then takes two capsules at bedtime.   Yes Historical Provider, MD  HYDROcodone-acetaminophen (HYCET) 7.5-325 mg/15 ml solution Take 10 mLs by mouth 4 (four) times daily as needed for moderate pain. 08/09/15  Yes Orpah Greek, MD  HYDROcodone-acetaminophen (NORCO) 10-325 MG tablet Take 1 tablet by mouth 2 (two) times daily as needed for moderate pain.    Yes Historical Provider, MD  Ipratropium-Albuterol (COMBIVENT) 20-100 MCG/ACT AERS respimat Inhale 1 puff into the lungs 4 (four) times daily.   Yes Historical Provider, MD  metoprolol tartrate (LOPRESSOR) 25 MG tablet Take 12.5 mg by mouth 2 (two) times daily.   Yes Historical Provider, MD  mometasone (ASMANEX) 220 MCG/INH inhaler Inhale 1 puff into the lungs at bedtime.     Yes Historical Provider, MD  Multiple Vitamin (MULTIVITAMIN) tablet Take 1 tablet by mouth daily.   Yes Historical Provider, MD  nitroGLYCERIN (NITROSTAT) 0.4 MG SL tablet Place 0.4 mg under the tongue every 5 (five) minutes as needed for chest pain.   Yes Historical Provider, MD  pantoprazole (PROTONIX) 20 MG tablet Take 1 tablet (20 mg total) by mouth 2 (two) times daily before  a meal. 08/09/15  Yes Orpah Greek, MD  pravastatin (PRAVACHOL) 40 MG tablet Take 40 mg by mouth at bedtime.   Yes Historical Provider, MD  ranitidine (ZANTAC) 300  MG tablet Take 300 mg by mouth at bedtime.   Yes Historical Provider, MD  sucralfate (CARAFATE) 1 GM/10ML suspension Take 10 mLs (1 g total) by mouth 4 (four) times daily -  with meals and at bedtime. 08/09/15  Yes Orpah Greek, MD  guaiFENesin (MUCINEX) 600 MG 12 hr tablet Take 2 tablets (1,200 mg total) by mouth 2 (two) times daily. Patient not taking: Reported on 08/09/2015 04/09/14   Velta Addison Mikhail, DO  levofloxacin (LEVAQUIN) 500 MG tablet Take 1 tablet (500 mg total) by mouth daily. Patient not taking: Reported on 08/09/2015 04/09/14   Velta Addison Mikhail, DO  metoprolol tartrate (LOPRESSOR) 12.5 mg TABS Take 0.5 tablets (12.5 mg total) by mouth 2 (two) times daily. 02/25/11   Meriel Pica, PA-C     Allergies:     Allergies  Allergen Reactions  . Bee Venom Anaphylaxis    Has epi pen  . Influenza Vaccines Other (See Comments)    "Mortally sick for 2 weeks"     Physical Exam:   Vitals  Blood pressure 111/99, pulse 86, temperature 97.8 F (36.6 C), temperature source Oral, resp. rate 12, SpO2 98 %.   1. General  lying in bed in NAD,    2. Normal affect and insight, Not Suicidal or Homicidal, Awake Alert, Oriented X 3.  3. No F.N deficits, ALL C.Nerves Intact, Strength 5/5 all 4 extremities, Sensation intact all 4 extremities, Plantars down going.  4. Ears and Eyes appear Normal,  Pink Conjunctivae clear, PERRLA. Moist Oral Mucosa.  5. Supple Neck, No JVD, No cervical lymphadenopathy appriciated, No Carotid Bruits.  6. Symmetrical Chest wall movement, Good air movement bilaterally, CTAB.  7. RRR, No Gallops, Rubs or Murmurs, No Parasternal Heave.  8. Midline scar, Positive Bowel Sounds, Abdomen Soft, No tenderness, No organomegaly appriciated,No rebound -guarding or rigidity.  9.  No Cyanosis,  Normal Skin Turgor, No Skin Rash or Bruise.  10. Good muscle tone,  joints appear normal , no effusions, Normal ROM.  11. No Palpable Lymph Nodes in Neck or Axillae     Data Review:    CBC  Recent Labs Lab 08/18/15 2108  WBC 8.4  HGB 14.1  HCT 42.6  PLT 366  MCV 90.8  MCH 30.1  MCHC 33.1  RDW 12.8   ------------------------------------------------------------------------------------------------------------------  Chemistries   Recent Labs Lab 08/18/15 2108  NA 140  K 3.9  CL 109  CO2 23  GLUCOSE 95  BUN 8  CREATININE 1.11  CALCIUM 9.7  AST 25  ALT 23  ALKPHOS 57  BILITOT 0.4   ------------------------------------------------------------------------------------------------------------------ CrCl cannot be calculated (Unknown ideal weight.). ------------------------------------------------------------------------------------------------------------------ No results for input(s): TSH, T4TOTAL, T3FREE, THYROIDAB in the last 72 hours.  Invalid input(s): FREET3  Coagulation profile No results for input(s): INR, PROTIME in the last 168 hours. ------------------------------------------------------------------------------------------------------------------- No results for input(s): DDIMER in the last 72 hours. -------------------------------------------------------------------------------------------------------------------  Cardiac Enzymes  Recent Labs Lab 08/19/15 0009  TROPONINI <0.03   ------------------------------------------------------------------------------------------------------------------ No results found for: BNP   ---------------------------------------------------------------------------------------------------------------  Urinalysis No results found for: COLORURINE, APPEARANCEUR, LABSPEC, [place], GLUCOSEU, HGBUR, BILIRUBINUR, KETONESUR, PROTEINUR, UROBILINOGEN, NITRITE,  LEUKOCYTESUR  ----------------------------------------------------------------------------------------------------------------   Imaging Results:    Ct Abdomen Pelvis W Contrast  08/19/2015  CLINICAL DATA:  81 year old male with abdominal pain and rectal bleeding EXAM: CT ABDOMEN AND PELVIS WITH CONTRAST TECHNIQUE: Multidetector CT imaging of the abdomen and pelvis was performed using the standard protocol following bolus administration of intravenous contrast. CONTRAST:  193mL ISOVUE-300 IOPAMIDOL (ISOVUE-300)  INJECTION 61% COMPARISON:  Abdominal radiograph dated 10/26/2011 and abdominal MRI dated 02/25/2011 FINDINGS: The visualized lung bases are clear. There is coronary vascular calcification. No intra-abdominal free air or free fluid. Cholecystectomy. A 2.2 cm ill-defined right hepatic lesion with apparent peripheral nodular enhancement is not characterized on this CT but corresponds to the lesion seen on the prior MRI and likely represents a hemangioma. The pancreas, spleen, and the adrenal glands appear unremarkable. There multiple bilateral renal hypodense lesions measuring up to 3.4 cm in the superior pole of the right kidney. The larger lesions represent cysts and the smaller lesions are not well characterized. There is no hydronephrosis on either side. The visualized ureters and urinary bladder appear unremarkable. The prostate and seminal vesicles are grossly unremarkable. There is sigmoid diverticulosis with muscular hypertrophy. There are scattered colonic diverticula. There is no active inflammatory changes. There is no evidence of bowel obstruction. There is abutment of loops of small bowel to the anterior peritoneal wall and ventral hernia repair mesh compatible with adhesions. There is laxity of the anterior abdominal wall with protrusion of loops of small bowel. There is midline vertical anterior abdominal wall incisional scar. There is osteopenia with degenerative changes of the spine.  There is disc space narrowing and anterior wedging and L1-L2. Lower lumbar posterior fixation screws noted. There is a total right hip arthroplasty. No acute fracture or dislocation. IMPRESSION: Extensive colonic diverticulosis. No active inflammatory changes. No bowel obstruction. Electronically Signed   By: Anner Crete M.D.   On: 08/19/2015 01:12       Assessment & Plan:    Active Problems:   HLD (hyperlipidemia)   Hypertension   Blood in stool   Rectal bleeding    1. Abdominal pain/ rectal bleeding NPO Type and screen Serial cbc Gi consult called by ED, appreciate input   2. CAD Hold asirin, plavix  3.  Hypertension Hold amlodipine, Hold enalapril  4.  Copd  Stable  DVT Prophylaxis  SCDs   AM Labs Ordered, also please review Full Orders  Family Communication: Admission, patients condition and plan of care including tests being ordered have been discussed with the patient  who indicate understanding and agree with the plan and Code Status.  Code Status FULL CODE  Likely DC to    Condition GUARDED    Consults called: GI by ED  Admission status: inpatient  Time spent in minutes : 45 minutes   Jani Gravel M.D on 08/19/2015 at 3:14 AM  Between 7am to 7pm - Pager - [phone]. After 7pm go to [email or web address] - password [facility]  [facility] - Office  [phone]

## 2015-08-20 DIAGNOSIS — D62 Acute posthemorrhagic anemia: Secondary | ICD-10-CM | POA: Diagnosis present

## 2015-08-20 DIAGNOSIS — K625 Hemorrhage of anus and rectum: Secondary | ICD-10-CM | POA: Diagnosis present

## 2015-08-20 DIAGNOSIS — J432 Centrilobular emphysema: Secondary | ICD-10-CM | POA: Diagnosis present

## 2015-08-20 DIAGNOSIS — R1310 Dysphagia, unspecified: Secondary | ICD-10-CM | POA: Diagnosis present

## 2015-08-20 DIAGNOSIS — I10 Essential (primary) hypertension: Secondary | ICD-10-CM | POA: Diagnosis present

## 2015-08-20 DIAGNOSIS — J438 Other emphysema: Secondary | ICD-10-CM

## 2015-08-20 DIAGNOSIS — C649 Malignant neoplasm of unspecified kidney, except renal pelvis: Secondary | ICD-10-CM

## 2015-08-20 LAB — COMPREHENSIVE METABOLIC PANEL
ALT: 21 U/L (ref 17–63)
AST: 25 U/L (ref 15–41)
Albumin: 3.5 g/dL (ref 3.5–5.0)
Alkaline Phosphatase: 50 U/L (ref 38–126)
Anion gap: 8 (ref 5–15)
BILIRUBIN TOTAL: 0.6 mg/dL (ref 0.3–1.2)
BUN: 9 mg/dL (ref 6–20)
CHLORIDE: 110 mmol/L (ref 101–111)
CO2: 24 mmol/L (ref 22–32)
CREATININE: 1.11 mg/dL (ref 0.61–1.24)
Calcium: 9.3 mg/dL (ref 8.9–10.3)
Glucose, Bld: 92 mg/dL (ref 65–99)
Potassium: 3.6 mmol/L (ref 3.5–5.1)
Sodium: 142 mmol/L (ref 135–145)
TOTAL PROTEIN: 6.5 g/dL (ref 6.5–8.1)

## 2015-08-20 LAB — CBC
HEMATOCRIT: 39.2 % (ref 39.0–52.0)
Hemoglobin: 12.9 g/dL — ABNORMAL LOW (ref 13.0–17.0)
MCH: 30 pg (ref 26.0–34.0)
MCHC: 32.9 g/dL (ref 30.0–36.0)
MCV: 91.2 fL (ref 78.0–100.0)
PLATELETS: 336 10*3/uL (ref 150–400)
RBC: 4.3 MIL/uL (ref 4.22–5.81)
RDW: 13 % (ref 11.5–15.5)
WBC: 7.9 10*3/uL (ref 4.0–10.5)

## 2015-08-20 MED ORDER — SODIUM CHLORIDE 0.9 % IV SOLN: INTRAVENOUS | Status: DC

## 2015-08-20 MED ORDER — ENALAPRIL MALEATE 5 MG PO TABS
10.0000 mg | ORAL_TABLET | Freq: Every day | ORAL | Status: DC
  Administered 2015-08-20 – 2015-08-22 (×3): 10 mg via ORAL
  Filled 2015-08-20: qty 1
  Filled 2015-08-20 (×2): qty 2

## 2015-08-20 MED ORDER — PEG-KCL-NACL-NASULF-NA ASC-C 100 G PO SOLR
0.5000 | Freq: Once | ORAL | Status: DC
  Filled 2015-08-20: qty 1

## 2015-08-20 MED ORDER — HYDROCORTISONE ACETATE 25 MG RE SUPP
25.0000 mg | Freq: Two times a day (BID) | RECTAL | Status: DC
  Administered 2015-08-20 – 2015-08-21 (×3): 25 mg via RECTAL
  Filled 2015-08-20 (×2): qty 1

## 2015-08-20 MED ORDER — PEG-KCL-NACL-NASULF-NA ASC-C 100 G PO SOLR: 0.5000 | Freq: Once | ORAL | Status: DC

## 2015-08-20 MED ORDER — METOPROLOL TARTRATE 25 MG PO TABS
25.0000 mg | ORAL_TABLET | Freq: Two times a day (BID) | ORAL | Status: DC
  Administered 2015-08-20 – 2015-08-22 (×4): 25 mg via ORAL
  Filled 2015-08-20: qty 2
  Filled 2015-08-20 (×3): qty 1

## 2015-08-20 MED ORDER — PEG-KCL-NACL-NASULF-NA ASC-C 100 G PO SOLR: 1.0000 | Freq: Once | ORAL | Status: DC

## 2015-08-20 NOTE — Progress Notes (Addendum)
Progress Note   Subjective  [name] is a 81 y/o Caucasian male who was admitted on 6/36/17 with a complaint of rectal bleeding.  This morning, the patient is found sitting up in bed eating a clear liquid diet. He tells me he did have a bowel movement this morning with only a scant amt of brb on the tp. He does continue with some epigastric burning in his stomach, but otherwise denies complaints.   Objective   Vital signs in last 24 hours: Temp:  [98.1 F (36.7 C)-98.9 F (37.2 C)] 98.9 F (37.2 C) (06/27 0740) Pulse Rate:  [71-97] 71 (06/27 0805) Resp:  [13-28] 22 (06/27 0805) BP: (135-178)/(60-92) 160/75 mmHg (06/27 0805) SpO2:  [93 %-99 %] 97 % (06/27 0805) Weight:  [203 lb 14.8 oz (92.5 kg)-205 lb 4 oz (93.1 kg)] 205 lb 4 oz (93.1 kg) (06/27 0500) Last BM Date: 08/18/15 General: Caucasian male in NAD Heart:  Regular rate and rhythm; no murmurs Lungs: Respirations even and unlabored, lungs CTA bilaterally Abdomen:  Soft, Mild generalized ttp worse in epigastric region and nondistended. Normal bowel sounds. Extremities:  Without edema. Neurologic:  Alert and oriented,  grossly normal neurologically. Psych:  Cooperative. Normal mood and affect.  Intake/Output from previous day: 06/26 0701 - 06/27 0700 In: 936 [I.V.:836; IV Piggyback:100] Out: 1100 [Urine:1100]  Lab Results:  Recent Labs  08/19/15 0445 08/19/15 1523 08/20/15 0243  WBC 7.0 8.1 7.9  HGB 12.5* 13.4 12.9*  HCT 37.7* 40.4 39.2  PLT 324 361 336   BMET  Recent Labs  08/18/15 2108 08/19/15 0445 08/20/15 0243  NA 140 139 142  K 3.9 3.5 3.6  CL 109 109 110  CO2 23 24 24   GLUCOSE 95 89 92  BUN 8 8 9   CREATININE 1.11 1.05 1.11  CALCIUM 9.7 9.0 9.3   LFT  Recent Labs  08/20/15 0243  PROT 6.5  ALBUMIN 3.5  AST 25  ALT 21  ALKPHOS 50  BILITOT 0.6   PT/INR  Recent Labs  08/19/15 1523  LABPROT 15.3*  INR 1.20    Studies/Results: Ct Abdomen Pelvis W Contrast  08/19/2015   CLINICAL DATA:  81 year old male with abdominal pain and rectal bleeding EXAM: CT ABDOMEN AND PELVIS WITH CONTRAST TECHNIQUE: Multidetector CT imaging of the abdomen and pelvis was performed using the standard protocol following bolus administration of intravenous contrast. CONTRAST:  125mL ISOVUE-300 IOPAMIDOL (ISOVUE-300) INJECTION 61% COMPARISON:  Abdominal radiograph dated 10/26/2011 and abdominal MRI dated 02/25/2011 FINDINGS: The visualized lung bases are clear. There is coronary vascular calcification. No intra-abdominal free air or free fluid. Cholecystectomy. A 2.2 cm ill-defined right hepatic lesion with apparent peripheral nodular enhancement is not characterized on this CT but corresponds to the lesion seen on the prior MRI and likely represents a hemangioma. The pancreas, spleen, and the adrenal glands appear unremarkable. There multiple bilateral renal hypodense lesions measuring up to 3.4 cm in the superior pole of the right kidney. The larger lesions represent cysts and the smaller lesions are not well characterized. There is no hydronephrosis on either side. The visualized ureters and urinary bladder appear unremarkable. The prostate and seminal vesicles are grossly unremarkable. There is sigmoid diverticulosis with muscular hypertrophy. There are scattered colonic diverticula. There is no active inflammatory changes. There is no evidence of bowel obstruction. There is abutment of loops of small bowel to the anterior peritoneal wall and ventral hernia repair mesh compatible with adhesions. There is laxity of the anterior abdominal  wall with protrusion of loops of small bowel. There is midline vertical anterior abdominal wall incisional scar. There is osteopenia with degenerative changes of the spine. There is disc space narrowing and anterior wedging and L1-L2. Lower lumbar posterior fixation screws noted. There is a total right hip arthroplasty. No acute fracture or dislocation. IMPRESSION:  Extensive colonic diverticulosis. No active inflammatory changes. No bowel obstruction. Electronically Signed   By: Anner Crete M.D.   On: 08/19/2015 01:12       Assessment / Plan:   Impression: 1. Hematochezia: Started 08/18/15 at 2am, continued Until 6 PM 08/18/15; Ct shopwing extensive diverticular disease: initial drop in hgb from 14.1 to 12.5 now 12.9-pt reports scant amt of brb on tp this morning with bowel movement-diff dx continues to include hemorrhoidal vs diverticular bleed 2. Acute blood loss anemia:See above 3. Generalized abdominal pain: Patient describes 2 weeks of epigastric pain and burning and now I generalized abdominal pain-consider GERD versus gastritis versus IBS versus diverticular disease or some other 4. Dysphagia: Patient with recent EGD 2 weeks ago at the [place], normal per patient-requesting report, continues with feelings of dysphagia toward solids foods, patient is set up for further swallow studies in a week or 2 5. GERD: Patient presented to the ED on 08/08/15 with complaint of dysphagia and epigastric pain, at that time Protonix was increased to 40 mg twice a day and patient was told to use ranitidine as well, continues with symptoms, requested recent records from [place], have not received   Plan: 1. Continue supportive measures 2. Serial hgb with transfusion as necessary 3. Continue clear liquid diet for now 4. Patient does express a small amt of brbpr this morning and over the past 2 weeks, could benefit from non-emergent colonoscopy, though pt not enthusiastic regarding this due to past perforation-[facility], could consider as outpatient 5. Will discuss above with Dr. Silverio Decamp, please await any further recs  Thank you for your kind consultation, we will likely sign off today.  Active Problems:   HLD (hyperlipidemia)   Hypertension   Blood in stool   Rectal bleeding     LOS: 1 day   Levin Erp  08/20/2015, 8:57  AM  Pager # [phone]  Addendum: Per discussion with Dr. Silverio Decamp, will proceed with colonoscopy tomorrow for further eval of continued rectal bleeding. Dr. Silverio Decamp will discuss this further with pt this afternoon.    Attending physician's note   I have taken a history, examined the patient and reviewed the chart. I agree with the Advanced Practitioner's note, impression and recommendations. Patient had only episode of blood per rectum, its 2-3 drops of bright red blood "just like when he has a cut", no pain and not mixed with stool. Given the small volume BRBPR likely etiology internal hemorrhoidal bleed. No significant drop in Hgb. Will hold off colonoscopy now, will review records from [place]. Anusol suppository BID. Advance diet. Will schedule for hemorrhoidal band ligation as outpatient.   Damaris Hippo, MD [phone] Mon-Fri 8a-5p [phone] after 5p, weekends, holidays

## 2015-08-20 NOTE — Progress Notes (Signed)
PROGRESS NOTE    Wayne Meadows  [id number] DOB: October 09, 1934 DOA: 08/18/2015 PCP: PROVIDER NOT IN SYSTEM   Brief Narrative:  81 y.o. WM PMHx CVA (Right cerebral; total obstruction of the right ICA), HTN, CAD s/p stent, COPD (not on home o2), Tobacco Abuse  c/o rectal bleeding starting 2am. 08/18/2015 On the toilet paper and in the toilet. Occurred several times throughout the day. + abdominal pain " sharp" upper abdomen. + nausea. No emesis. Denies constipation, diarrhea, black stool. Pt denies fever, chills, lightheadedness, cp, palp, sob.    Assessment & Plan:   Active Problems:   HLD (hyperlipidemia)   Hypertension   Blood in stool   Rectal bleeding   Bright red blood per rectum   Acute blood loss anemia   Dysphagia   CAD in native artery   Renal cell cancer (HCC)   Essential hypertension   Chronic obstructive pulmonary disease (HCC)  BRBPR -Overnight slight amount of bright red blood per rectum. -GI debating over performing colonoscopy now or as an outpatient. Will await their recommendation; counseled patient DC in a.m. if GI not going to perform EGD.  Acute blood loss anemia  -Stable  Recent Labs Lab 08/18/15 2108 08/19/15 0445 08/19/15 1523 08/20/15 0243  HGB 14.1 12.5* 13.4 12.9*   Dysphagia -[facility] 2. Per GI notes EGD performed~2 weeks ago they are requesting records.  GERD -Protonix 40 mg BID -Carafate 1 gm TID  CAD in native artery/ s/p Circumflex stent 2011 -Aspirin 81 mg daily  -Continue to hold Plavix   Renal Cell CA  HTN -Increase metoprolol 25 mg BID -Enalapril 10 mg QHS (home dose is 20 mg)  COPD -Pulmicort nebulizer daily -DuoNeb QID  Hx CVA 2011 Known R ICA obstruction     DVT prophylaxis: SCD Code Status: Full code Family Communication: None Disposition Plan: Per GI   Consultants:  Dr.K Denzil Magnuson [facility]   Procedures/Significant Events:  EGD 2 weeks ago at  the [place], normal per patient  Cultures 6/26 MRSA by PCR negative   Antimicrobials: None   Devices    LINES / TUBES:      Continuous Infusions: . dextrose 5 % and 0.9 % NaCl with KCl 20 mEq/L 1,000 mL (08/19/15 1634)     Subjective: 6/20 7A/O 4, NAD, negative CP negative SOB negative abdominal pain. States gets most of his care at [facility]. States 2013 had colonoscopy compensated by perforation.   Objective: Filed Vitals:   08/20/15 0500 08/20/15 0600 08/20/15 0740 08/20/15 0805  BP:  147/80 152/74 160/75  Pulse:  77 73 71  Temp:   98.9 F (37.2 C)   TempSrc:   Oral   Resp:  14 15 22   Height:      Weight: 93.1 kg (205 lb 4 oz)     SpO2:  96% 95% 97%    Intake/Output Summary (Last 24 hours) at 08/20/15 1047 Last data filed at 08/20/15 0957  Gross per 24 hour  Intake   1296 ml  Output   1100 ml  Net    196 ml   Filed Weights   08/19/15 0912 08/20/15 0500  Weight: 92.5 kg (203 lb 14.8 oz) 93.1 kg (205 lb 4 oz)    Examination:  General: A/O 4, NAD, No acute respiratory distress Eyes: negative scleral hemorrhage, negative anisocoria, negative icterus ENT: Negative Runny nose, negative gingival bleeding, Neck:  Negative scars, masses, torticollis, lymphadenopathy, JVD Lungs:  Clear to auscultation bilaterally without wheezes or crackles Cardiovascular: Regular rate and rhythm without murmur gallop or rub normal S1 and S2 Abdomen: negative abdominal pain, positive distention, positive soft, bowel sounds, no rebound, no ascites, no appreciable mass Extremities: No significant cyanosis, clubbing, or edema bilateral lower extremities Skin: Negative rashes, lesions, ulcers Psychiatric:  Negative depression, negative anxiety, negative fatigue, negative mania  Central nervous system:  Cranial nerves II through XII intact, tongue/uvula midline, all extremities muscle strength 5/5, sensation intact throughout,  negative dysarthria, negative expressive aphasia,  negative receptive aphasia.  .     Data Reviewed: Care during the described time interval was provided by me .  I have reviewed this patient's available data, including medical history, events of note, physical examination, and all test results as part of my evaluation. I have personally reviewed and interpreted all radiology studies.  CBC:  Recent Labs Lab 08/18/15 2108 08/19/15 0445 08/19/15 1523 08/20/15 0243  WBC 8.4 7.0 8.1 7.9  HGB 14.1 12.5* 13.4 12.9*  HCT 42.6 37.7* 40.4 39.2  MCV 90.8 90.2 91.2 91.2  PLT 366 324 361 [id number]   Basic Metabolic Panel:  Recent Labs Lab 08/18/15 2108 08/19/15 0445 08/20/15 0243  NA 140 139 142  K 3.9 3.5 3.6  CL 109 109 110  CO2 23 24 24   GLUCOSE 95 89 92  BUN 8 8 9   CREATININE 1.11 1.05 1.11  CALCIUM 9.7 9.0 9.3   GFR: Estimated Creatinine Clearance: 63.8 mL/min (by C-G formula based on Cr of 1.11). Liver Function Tests:  Recent Labs Lab 08/18/15 2108 08/19/15 0445 08/20/15 0243  AST 25 23 25   ALT 23 19 21   ALKPHOS 57 46 50  BILITOT 0.4 0.8 0.6  PROT 7.5 6.7 6.5  ALBUMIN 4.2 3.6 3.5    Recent Labs Lab 08/18/15 2108  LIPASE 31   No results for input(s): AMMONIA in the last 168 hours. Coagulation Profile:  Recent Labs Lab 08/19/15 1523  INR 1.20   Cardiac Enzymes:  Recent Labs Lab 08/19/15 0009  TROPONINI <0.03   BNP (last 3 results) No results for input(s): PROBNP in the last 8760 hours. HbA1C: No results for input(s): HGBA1C in the last 72 hours. CBG: No results for input(s): GLUCAP in the last 168 hours. Lipid Profile: No results for input(s): CHOL, HDL, LDLCALC, TRIG, CHOLHDL, LDLDIRECT in the last 72 hours. Thyroid Function Tests: No results for input(s): TSH, T4TOTAL, FREET4, T3FREE, THYROIDAB in the last 72 hours. Anemia Panel: No results for input(s): VITAMINB12, FOLATE, FERRITIN, TIBC, IRON, RETICCTPCT in the last 72 hours. Urine analysis: No results found for: COLORURINE, APPEARANCEUR,  LABSPEC, PHURINE, GLUCOSEU, HGBUR, BILIRUBINUR, KETONESUR, PROTEINUR, UROBILINOGEN, NITRITE, LEUKOCYTESUR Sepsis Labs: @LABRCNTIP (procalcitonin:4,lacticidven:4)  ) Recent Results (from the past 240 hour(s))  MRSA PCR Screening     Status: None   Collection Time: 08/19/15  9:00 AM  Result Value Ref Range Status   MRSA by PCR NEGATIVE NEGATIVE Final    Comment:        The GeneXpert MRSA Assay (FDA approved for NASAL specimens only), is one component of a comprehensive MRSA colonization surveillance program. It is not intended to diagnose MRSA infection nor to guide or monitor treatment for MRSA infections.          Radiology Studies: Ct Abdomen Pelvis W Contrast  08/19/2015  CLINICAL DATA:  81 year old male with abdominal pain and rectal bleeding EXAM: CT ABDOMEN AND PELVIS WITH CONTRAST TECHNIQUE: Multidetector CT imaging of the abdomen and pelvis was  performed using the standard protocol following bolus administration of intravenous contrast. CONTRAST:  129mL ISOVUE-300 IOPAMIDOL (ISOVUE-300) INJECTION 61% COMPARISON:  Abdominal radiograph dated 10/26/2011 and abdominal MRI dated 02/25/2011 FINDINGS: The visualized lung bases are clear. There is coronary vascular calcification. No intra-abdominal free air or free fluid. Cholecystectomy. A 2.2 cm ill-defined right hepatic lesion with apparent peripheral nodular enhancement is not characterized on this CT but corresponds to the lesion seen on the prior MRI and likely represents a hemangioma. The pancreas, spleen, and the adrenal glands appear unremarkable. There multiple bilateral renal hypodense lesions measuring up to 3.4 cm in the superior pole of the right kidney. The larger lesions represent cysts and the smaller lesions are not well characterized. There is no hydronephrosis on either side. The visualized ureters and urinary bladder appear unremarkable. The prostate and seminal vesicles are grossly unremarkable. There is sigmoid  diverticulosis with muscular hypertrophy. There are scattered colonic diverticula. There is no active inflammatory changes. There is no evidence of bowel obstruction. There is abutment of loops of small bowel to the anterior peritoneal wall and ventral hernia repair mesh compatible with adhesions. There is laxity of the anterior abdominal wall with protrusion of loops of small bowel. There is midline vertical anterior abdominal wall incisional scar. There is osteopenia with degenerative changes of the spine. There is disc space narrowing and anterior wedging and L1-L2. Lower lumbar posterior fixation screws noted. There is a total right hip arthroplasty. No acute fracture or dislocation. IMPRESSION: Extensive colonic diverticulosis. No active inflammatory changes. No bowel obstruction. Electronically Signed   By: Anner Crete M.D.   On: 08/19/2015 01:12        Scheduled Meds: . aspirin  81 mg Oral Daily  . budesonide  0.5 mg Nebulization QHS  . docusate sodium  100 mg Oral BID  . enalapril  10 mg Oral Daily  . famotidine (PEPCID) IV  20 mg Intravenous Q12H  . gabapentin  300-600 mg Oral TID  . ipratropium-albuterol  3 mL Inhalation QID  . metoprolol tartrate  25 mg Oral BID  . pantoprazole  40 mg Oral BID AC  . pravastatin  40 mg Oral QHS  . sodium chloride flush  3 mL Intravenous Q12H  . sucralfate  1 g Oral TID WC & HS   Continuous Infusions: . dextrose 5 % and 0.9 % NaCl with KCl 20 mEq/L 1,000 mL (08/19/15 1634)     LOS: 1 day    Time spent: 40 minutes    Harjit Douds, Geraldo Docker, MD [facility] Pager [phone]   If 7PM-7AM, please contact night-coverage [email or web address] Password [facility] 08/20/2015, 10:47 AM

## 2015-08-21 ENCOUNTER — Encounter (HOSPITAL_COMMUNITY)
Admission: EM | Disposition: A | Discharge status: Home / Self Care | Payer: Self-pay | Source: Home / Self Care | Attending: Internal Medicine

## 2015-08-21 DIAGNOSIS — I10 Essential (primary) hypertension: Secondary | ICD-10-CM

## 2015-08-21 DIAGNOSIS — R1310 Dysphagia, unspecified: Secondary | ICD-10-CM

## 2015-08-21 DIAGNOSIS — I2510 Atherosclerotic heart disease of native coronary artery without angina pectoris: Secondary | ICD-10-CM

## 2015-08-21 DIAGNOSIS — J449 Chronic obstructive pulmonary disease, unspecified: Secondary | ICD-10-CM

## 2015-08-21 DIAGNOSIS — D62 Acute posthemorrhagic anemia: Secondary | ICD-10-CM

## 2015-08-21 LAB — HEMOGLOBIN: HEMOGLOBIN: 13.7 g/dL (ref 13.0–17.0)

## 2015-08-21 SURGERY — COLONOSCOPY
Anesthesia: Moderate Sedation

## 2015-08-21 MED ORDER — PEG-KCL-NACL-NASULF-NA ASC-C 100 G PO SOLR
0.5000 | Freq: Once | ORAL | Status: AC
  Administered 2015-08-21: 100 g via ORAL

## 2015-08-21 MED ORDER — POLYETHYLENE GLYCOL 3350 17 G PO PACK
17.0000 g | PACK | Freq: Every day | ORAL | Status: DC
  Administered 2015-08-21: 17 g via ORAL
  Filled 2015-08-21: qty 1

## 2015-08-21 MED ORDER — PEG-KCL-NACL-NASULF-NA ASC-C 100 G PO SOLR
0.5000 | Freq: Once | ORAL | Status: AC
  Administered 2015-08-21: 100 g via ORAL
  Filled 2015-08-21: qty 1

## 2015-08-21 MED ORDER — PEG-KCL-NACL-NASULF-NA ASC-C 100 G PO SOLR: 1.0000 | Freq: Once | ORAL | Status: DC

## 2015-08-21 NOTE — Care Management Important Message (Signed)
Important Message  Patient Details  Name: Wayne Meadows MRN: [id number] Date of Birth: 1934-11-23   Medicare Important Message Given:  Yes    Chazz Philson, Leroy Sea 08/21/2015, 10:17 AM

## 2015-08-21 NOTE — Progress Notes (Signed)
[facility] Progress Note  Patient: Wayne Meadows IHK:[id number]   PCP: PROVIDER NOT IN SYSTEM DOB: [birth year]   DOA: 08/18/2015   DOS: 08/21/2015   Date of Service: the patient was seen and examined on 08/21/2015  Subjective: Patient mentions he had another episode of blood this morning as well as a clot. Also started having some complaints of right upper quadrant abdominal pain after eating. Denies any nausea or vomiting. The pain is resolved at the time of my evaluation. Nutrition: Tolerating oral diet but with pain  Brief hospital course: Pt. with PMH of HTN, CAD, RCC, COPD; admitted on 08/18/2015, with complaint of bright red blood per rectum, was found to have suspected hemorrhoidal bleeding, initial plan was to discharge home but due to presence of clots along with the blood now plan is to go for colonoscopy on 08/22/2015. Currently further plan is follow colonoscopy finding..  Assessment and Plan: 1. BRBPR. Likely hemorrhoidal bleeding. Presence of clot is concerning for diverticular bleeding. Patient will be going for colonoscopy on 08/22/2015. Nothing by mouth after midnight. We will follow the results.  2. GERD. Continue Protonix twice a day continue Carafate.  3. Dysphagia. Recent EGD 2 weeks ago. GI is following up on this.  4. HTN. Continue Lopressor.  5. COPD. Continue Pulmicort continue DuoNeb.  6. History of CVA. Currently holding antiplatelet and anticoagulation in the setting of ongoing bleeding.  Pain management: When necessary Tylenol Activity: Currently independent, no indication for physical therapy Bowel regimen: last BM 08/21/2015 Diet: Clear liquid diet, nothing by mouth after midnight DVT Prophylaxis: mechanical compression device  Advance goals of care discussion: Full code  Family Communication: no family was present at bedside, at the time of interview.   Disposition:  Discharge to home. Expected discharge date: 08/22/2015, post  colonoscopy recovery and findings,  Consultants: Gastroenterology Procedures: Colonoscopy 08/22/2015  Antibiotics: Anti-infectives    None        Intake/Output Summary (Last 24 hours) at 08/21/15 1829 Last data filed at 08/21/15 1811  Gross per 24 hour  Intake   1182 ml  Output      0 ml  Net   1182 ml   Filed Weights   08/19/15 0912 08/20/15 0500 08/20/15 2050  Weight: 92.5 kg (203 lb 14.8 oz) 93.1 kg (205 lb 4 oz) 93.532 kg (206 lb 3.2 oz)    Objective: Physical Exam: Filed Vitals:   08/20/15 2050 08/21/15 0417 08/21/15 0751 08/21/15 1811  BP: 159/87 119/60 156/75 154/71  Pulse: 89 72 74 70  Temp: 97.5 F (36.4 C) 98 F (36.7 C) 98.2 F (36.8 C) 98.3 F (36.8 C)  TempSrc: Oral Oral Oral Oral  Resp: 17 17 16 16   Height:      Weight: 93.532 kg (206 lb 3.2 oz)     SpO2: 99% 96% 96% 98%    General: Alert, Awake and Oriented to Time, Place and Person. Appear in mild distress Eyes: PERRL, Conjunctiva normal ENT: Oral Mucosa clear moist. Neck: no JVD, no Abnormal Mass Or lumps Cardiovascular: S1 and S2 Present, no Murmur, Respiratory: Bilateral Air entry equal and Decreased, Clear to Auscultation, no Crackles, no wheezes Abdomen: Bowel Sound present, Soft and mild right-sided tenderness Skin: no redness, no Rash  Extremities: no Pedal edema, no calf tenderness Neurologic: Grossly no focal neuro deficit. Bilaterally Equal motor strength  Data Reviewed: CBC:  Recent Labs Lab 08/18/15 2108 08/19/15 0445 08/19/15 1523 08/20/15 0243 08/21/15 1129  WBC 8.4 7.0 8.1 7.9  --  HGB 14.1 12.5* 13.4 12.9* 13.7  HCT 42.6 37.7* 40.4 39.2  --   MCV 90.8 90.2 91.2 91.2  --   PLT 366 324 361 336  --    Basic Metabolic Panel:  Recent Labs Lab 08/18/15 2108 08/19/15 0445 08/20/15 0243  NA 140 139 142  K 3.9 3.5 3.6  CL 109 109 110  CO2 23 24 24   GLUCOSE 95 89 92  BUN 8 8 9   CREATININE 1.11 1.05 1.11  CALCIUM 9.7 9.0 9.3    Liver Function Tests:  Recent  Labs Lab 08/18/15 2108 08/19/15 0445 08/20/15 0243  AST 25 23 25   ALT 23 19 21   ALKPHOS 57 46 50  BILITOT 0.4 0.8 0.6  PROT 7.5 6.7 6.5  ALBUMIN 4.2 3.6 3.5    Recent Labs Lab 08/18/15 2108  LIPASE 31   No results for input(s): AMMONIA in the last 168 hours. Coagulation Profile:  Recent Labs Lab 08/19/15 1523  INR 1.20   Cardiac Enzymes:  Recent Labs Lab 08/19/15 0009  TROPONINI <0.03   BNP (last 3 results) No results for input(s): PROBNP in the last 8760 hours.  CBG: No results for input(s): GLUCAP in the last 168 hours.  Studies: No results found.   Scheduled Meds: . aspirin  81 mg Oral Daily  . budesonide  0.5 mg Nebulization QHS  . docusate sodium  100 mg Oral BID  . enalapril  10 mg Oral Daily  . gabapentin  300-600 mg Oral TID  . hydrocortisone  25 mg Rectal BID  . ipratropium-albuterol  3 mL Inhalation QID  . metoprolol tartrate  25 mg Oral BID  . pantoprazole  40 mg Oral BID AC  . peg 3350 powder  0.5 kit Oral Once  . polyethylene glycol  17 g Oral Daily  . pravastatin  40 mg Oral QHS  . sodium chloride flush  3 mL Intravenous Q12H   Continuous Infusions:  PRN Meds: acetaminophen **OR** acetaminophen, ondansetron (ZOFRAN) IV  Time spent: 30 minutes  Author: Berle Mull, MD [facility] Pager: [phone] 08/21/2015 6:29 PM  If 7PM-7AM, please contact night-coverage at [email or web address], password [facility]

## 2015-08-21 NOTE — Progress Notes (Signed)
Progress Note   Subjective  [name] is a 81 y/o [place] male who was admitted on 08/19/15 with a complaint of rectal bleeding.  This morning, the patient is found sitting up in his chair. He tells me he had another episode of hematochezia this morning that was about the size of a quarter. He explains that he just saw Dr. Silverio Decamp and plans are to proceed with a colonoscopy tomorrow. He denies any new complaints. He did eat a full regular breakfast.   Objective   Vital signs in last 24 hours: Temp:  [97.5 F (36.4 C)-98.2 F (36.8 C)] 98.2 F (36.8 C) (06/28 0751) Pulse Rate:  [72-89] 74 (06/28 0751) Resp:  [15-17] 16 (06/28 0751) BP: (119-159)/(60-87) 156/75 mmHg (06/28 0751) SpO2:  [96 %-99 %] 96 % (06/28 0751) Weight:  [206 lb 3.2 oz (93.532 kg)] 206 lb 3.2 oz (93.532 kg) (06/27 2050) Last BM Date: 08/19/15 General: Caucasian male in NAD Heart: Regular rate and rhythm; no murmurs Lungs: Respirations even and unlabored, lungs CTA bilaterally Abdomen: Soft, Mild generalized ttp worse in epigastric region and nondistended. Normal bowel sounds. Extremities: Without edema. Neurologic: Alert and oriented, grossly normal neurologically. Psych: Cooperative. Normal mood and affect.  Intake/Output from previous day: 06/27 0701 - 06/28 0700 In: 1662 [P.O.:1302; I.V.:360] Out: 600 [Urine:600] Intake/Output this shift: Total I/O In: 240 [P.O.:240] Out: 0   Lab Results:  Recent Labs  08/19/15 0445 08/19/15 1523 08/20/15 0243  WBC 7.0 8.1 7.9  HGB 12.5* 13.4 12.9*  HCT 37.7* 40.4 39.2  PLT 324 361 336   BMET  Recent Labs  08/18/15 2108 08/19/15 0445 08/20/15 0243  NA 140 139 142  K 3.9 3.5 3.6  CL 109 109 110  CO2 23 24 24   GLUCOSE 95 89 92  BUN 8 8 9   CREATININE 1.11 1.05 1.11  CALCIUM 9.7 9.0 9.3   LFT  Recent Labs  08/20/15 0243  PROT 6.5  ALBUMIN 3.5  AST 25  ALT 21  ALKPHOS 50  BILITOT 0.6   PT/INR  Recent Labs  08/19/15 1523    LABPROT 15.3*  INR 1.20    Studies/Results: No results found.     Assessment / Plan:   Impression: 1. Hematochezia: Started 08/18/15 at 2am, continued Until 6 PM 08/18/15; Ct showing extensive diverticular disease: initial drop in hgb from 14.1 to 12.5 now 13.7-pt reports continued brbpr this morning, slightly more than yesterday am 2. Acute blood loss anemia:See above 3. Generalized abdominal pain: Patient describes 2 weeks of epigastric pain and burning and now I generalized abdominal pain-consider GERD versus gastritis versus IBS versus diverticular disease or some other 4. Dysphagia: Patient with recent EGD 2 weeks ago at the [place], normal per patient-requesting report, continues with feelings of dysphagia toward solids foods, patient is set up for further swallow studies in a week or 2 5. GERD: Patient presented to the ED on 08/08/15 with complaint of dysphagia and epigastric pain, at that time Protonix was increased to 40 mg twice a day and patient was told to use ranitidine as well, continues with symptoms, requested recent records from [place], have not received   Plan: 1. Continue supportive measures 2. Serial hgb with transfusion as necessary 3. Patient to remain on clear liquid the rest of today and NPO after midnight other than bowel prep 4. Colonoscopy scheduled for tomorrow morning with Dr. Silverio Decamp. Moviprep ordered 5. Will discuss above with Dr. Silverio Decamp, please await any further recs  Thank you for your kind consultation, we will likely sign off today.  Active Problems:   HLD (hyperlipidemia)   Hypertension   Blood in stool   Rectal bleeding   Bright red blood per rectum   Acute blood loss anemia   Dysphagia   CAD in native artery   Renal cell cancer ([place])   Essential hypertension   Chronic obstructive pulmonary disease ([place])     LOS: 2 days   Levin Erp  08/21/2015, 12:08 PM  Pager # [phone]

## 2015-08-21 NOTE — Anesthesia Preprocedure Evaluation (Addendum)
Anesthesia Evaluation  Patient identified by MRN, date of birth, ID band Patient awake    Reviewed: Allergy & Precautions, NPO status , Patient's Chart, lab work & pertinent test results, reviewed documented beta blocker date and time   Airway Mallampati: I  TM Distance: >3 FB Neck ROM: Full    Dental  (+) Upper Dentures, Dental Advisory Given   Pulmonary COPD, former smoker,    breath sounds clear to auscultation       Cardiovascular hypertension, Pt. on medications and Pt. on home beta blockers + CAD, + Past MI and + Peripheral Vascular Disease   Rhythm:Regular Rate:Normal     Neuro/Psych negative neurological ROS  negative psych ROS   GI/Hepatic negative GI ROS, Neg liver ROS, GERD  Medicated,  Endo/Other  negative endocrine ROS  Renal/GU Renal disease  negative genitourinary   Musculoskeletal  (+) Arthritis , Osteoarthritis,    Abdominal   Peds negative pediatric ROS (+)  Hematology negative hematology ROS (+)   Anesthesia Other Findings   Reproductive/Obstetrics negative OB ROS                           Lab Results  Component Value Date   WBC 7.9 08/20/2015   HGB 13.7 08/21/2015   HCT 39.2 08/20/2015   MCV 91.2 08/20/2015   PLT 336 08/20/2015   Lab Results  Component Value Date   CREATININE 1.11 08/20/2015   BUN 9 08/20/2015   NA 142 08/20/2015   K 3.6 08/20/2015   CL 110 08/20/2015   CO2 24 08/20/2015   Lab Results  Component Value Date   INR 1.20 08/19/2015   INR 1.38 10/25/2011   INR 1.08 03/18/2011   07/2015 EKG: SR, PAC's noted.   Anesthesia Physical Anesthesia Plan  ASA: III  Anesthesia Plan: MAC   Post-op Pain Management:    Induction: Intravenous  Airway Management Planned: Natural Airway and Nasal Cannula  Additional Equipment:   Intra-op Plan:   Post-operative Plan: Extubation in OR  Informed Consent: I have reviewed the patients History and  Physical, chart, labs and discussed the procedure including the risks, benefits and alternatives for the proposed anesthesia with the patient or authorized representative who has indicated his/her understanding and acceptance.     Plan Discussed with: CRNA  Anesthesia Plan Comments:         Anesthesia Quick Evaluation

## 2015-08-22 ENCOUNTER — Encounter (HOSPITAL_COMMUNITY)
Admission: EM | Disposition: A | Discharge status: Home / Self Care | Payer: Self-pay | Source: Home / Self Care | Attending: Internal Medicine

## 2015-08-22 ENCOUNTER — Inpatient Hospital Stay (HOSPITAL_COMMUNITY): Payer: Medicare Other | Admitting: Anesthesiology

## 2015-08-22 ENCOUNTER — Encounter (HOSPITAL_COMMUNITY): Payer: Self-pay | Admitting: Critical Care Medicine

## 2015-08-22 DIAGNOSIS — D126 Benign neoplasm of colon, unspecified: Secondary | ICD-10-CM | POA: Diagnosis present

## 2015-08-22 DIAGNOSIS — E785 Hyperlipidemia, unspecified: Secondary | ICD-10-CM

## 2015-08-22 DIAGNOSIS — K529 Noninfective gastroenteritis and colitis, unspecified: Secondary | ICD-10-CM | POA: Diagnosis present

## 2015-08-22 DIAGNOSIS — D123 Benign neoplasm of transverse colon: Secondary | ICD-10-CM | POA: Diagnosis present

## 2015-08-22 DIAGNOSIS — D125 Benign neoplasm of sigmoid colon: Secondary | ICD-10-CM

## 2015-08-22 HISTORY — PX: COLONOSCOPY: SHX5424

## 2015-08-22 SURGERY — COLONOSCOPY
Anesthesia: Monitor Anesthesia Care

## 2015-08-22 MED ORDER — PROPOFOL 500 MG/50ML IV EMUL
INTRAVENOUS | Status: DC | PRN
  Administered 2015-08-22: 200 ug/kg/min via INTRAVENOUS

## 2015-08-22 MED ORDER — POLYETHYLENE GLYCOL 3350 17 G PO PACK: 17.0000 g | PACK | Freq: Every day | ORAL | Status: DC

## 2015-08-22 MED ORDER — PROPOFOL 10 MG/ML IV BOLUS
INTRAVENOUS | Status: DC | PRN
  Administered 2015-08-22 (×3): 20 mg via INTRAVENOUS

## 2015-08-22 MED ORDER — HYDROCORTISONE ACETATE 25 MG RE SUPP: 25.0000 mg | Freq: Two times a day (BID) | RECTAL | Status: DC

## 2015-08-22 MED ORDER — PHENYLEPHRINE HCL 10 MG/ML IJ SOLN
INTRAMUSCULAR | Status: DC | PRN
  Administered 2015-08-22: 80 ug via INTRAVENOUS

## 2015-08-22 MED ORDER — LACTATED RINGERS IV SOLN
INTRAVENOUS | Status: DC
  Administered 2015-08-22: 1000 mL via INTRAVENOUS

## 2015-08-22 MED ORDER — AMLODIPINE BESYLATE 5 MG PO TABS: 5.0000 mg | ORAL_TABLET | Freq: Every day | ORAL | Status: DC

## 2015-08-22 NOTE — Progress Notes (Signed)
Patient Discharge:  Disposition: To home  Education: all discharge insrtuctions given to the patient. Confirms understanding.  IV: removed  Telemetry: removed and returned.  Follow-up appointments: given to patient to make appts  Prescriptions: in tow.  Transportation: Family   Belongings: with the patient.

## 2015-08-22 NOTE — Op Note (Signed)
[facility] Patient Name: Wayne Meadows Procedure Date : 08/22/2015 MRN: [id number] Attending MD: [name] , MD Date of Birth: 21-Mar-1934 CSN: [id number] Age: 81 Admit Type: Inpatient Procedure:                Colonoscopy Indications:              Evaluation of unexplained GI bleeding Providers:                [name], MD, Sarah Monday RN, RN, Cletis Athens, Technician Referring MD:              Medicines:                Monitored Anesthesia Care Complications:            No immediate complications. Estimated Blood Loss:     Estimated blood loss was minimal. Procedure:                Pre-Anesthesia Assessment:                           - Prior to the procedure, a History and Physical                            was performed, and patient medications and                            allergies were reviewed. The patient's tolerance of                            previous anesthesia was also reviewed. The risks                            and benefits of the procedure and the sedation                            options and risks were discussed with the patient.                            All questions were answered, and informed consent                            was obtained. Prior Anticoagulants: The patient                            last took Plavix (clopidogrel) 5 days prior to the                            procedure and last took aspirin on the day of the                            procedure. ASA Grade Assessment: III - A patient  with severe systemic disease. After reviewing the                            risks and benefits, the patient was deemed in                            satisfactory condition to undergo the procedure.                           After obtaining informed consent, the colonoscope                            was passed under direct vision. Throughout the      procedure, the patient's blood pressure, pulse, and                            oxygen saturations were monitored continuously. The                            [id number] scope was introduced through                            the anus and advanced to the the terminal ileum,                            with identification of the appendiceal orifice and                            IC valve. The colonoscopy was performed without                            difficulty. The patient tolerated the procedure                            well. The quality of the bowel preparation was                            inadequate. The terminal ileum, ileocecal valve,                            appendiceal orifice, and rectum were photographed. Scope In: 8:11:20 AM Scope Out: 8:44:14 AM Scope Withdrawal Time: 0 hours 19 minutes 44 seconds  Total Procedure Duration: 0 hours 32 minutes 54 seconds  Findings:      The perianal and digital rectal examinations were normal.      Two sessile polyps were found in the transverse colon. The polyps were 5       to 7 mm in size. These polyps were removed with a cold snare. Resection       and retrieval were complete.      Two sessile polyps were found in the rectum and transverse colon. The       polyps were 8 to 12 mm in size. These polyps were removed with a hot       snare. Resection and retrieval were complete.  A scattered area of erythematous granular mucosa with mild edema was       found in the sigmoid colon 35-15cm from anal verge suggestive of       possible ischemic colitis. Biopsies were taken with a cold forceps for       histology. Rectum and rest of the colonic mucosa appeared normal.      Multiple small and large-mouthed diverticula were found in the sigmoid       colon, descending colon and transverse colon.      Non-bleeding internal hemorrhoids were found during retroflexion. The       hemorrhoids were medium-sized. Impression:                - Preparation of the colon was inadequate.                           - Two 5 to 7 mm polyps in the transverse colon,                            removed with a cold snare. Resected and retrieved.                           - Two 8 to 12 mm polyps in the rectum and in the                            transverse colon, removed with a hot snare.                            Resected and retrieved.                           - Granularity in the sigmoid colon. Biopsied.                           - Diverticulosis in the sigmoid colon, in the                            descending colon and in the transverse colon.                           - Non-bleeding internal hemorrhoids. Moderate Sedation:      N/A Recommendation:           - Resume previous diet.                           - Continue present medications.                           - Resume Plavix (clopidogrel) at prior dose                            tomorrow. Refer to managing physician for further                            adjustment of therapy.                           -  Await pathology results to confirm ischemic                            colitis and also for the polyps removed, will be                            mailed to patient.                           - No repeat colonoscopy due to age.                           -Will sign off, but available for any questions Procedure Code(s):        --- Professional ---                           [phone], Colonoscopy, flexible; with removal of                            tumor(s), polyp(s), or other lesion(s) by snare                            technique                           45380, 59, Colonoscopy, flexible; with biopsy,                            single or multiple Diagnosis Code(s):        --- Professional ---                           K64.8, Other hemorrhoids                           K62.1, Rectal polyp                           D12.3, Benign neoplasm of transverse colon (hepatic                             flexure or splenic flexure)                           K63.89, Other specified diseases of intestine                           K92.2, Gastrointestinal hemorrhage, unspecified                           K57.30, Diverticulosis of large intestine without                            perforation or abscess without bleeding CPT copyright 2016 [employer]. All rights reserved. The codes documented in this report are preliminary and upon coder review may  be revised to meet current compliance requirements. Kavitha V.  Silverio Decamp, MD 08/22/2015 8:59:23 AM This report has been signed electronically. Number of Addenda: 0

## 2015-08-22 NOTE — Anesthesia Postprocedure Evaluation (Signed)
Anesthesia Post Note  Patient: Wayne Meadows  Procedure(s) Performed: Procedure(s) (LRB): COLONOSCOPY (N/A)  Patient location during evaluation: PACU Anesthesia Type: MAC Level of consciousness: awake and alert Pain management: pain level controlled Vital Signs Assessment: post-procedure vital signs reviewed and stable Respiratory status: spontaneous breathing, nonlabored ventilation, respiratory function stable and patient connected to nasal cannula oxygen Cardiovascular status: stable and blood pressure returned to baseline Anesthetic complications: no    Last Vitals:  Filed Vitals:   08/22/15 0910 08/22/15 0937  BP: 134/91 143/58  Pulse: 76 76  Temp:  36.6 C  Resp: 15 18    Last Pain:  Filed Vitals:   08/22/15 0938  PainSc: 0-No pain                 Effie Berkshire

## 2015-08-22 NOTE — Care Management Note (Signed)
Case Management Note  Patient Details  Name: Wayne Meadows MRN: [id number] Date of Birth: 12-08-1934  Subjective/Objective:         CM following for progression and d/c planning.            Action/Plan: 08/22/2015 Pt for d/c to home today, No HH or DME needs identified.   Expected Discharge Date:      08/22/2015            Expected Discharge Plan:  Home/Self Care  In-House Referral:  NA  Discharge planning Services  NA  Post Acute Care Choice:  NA Choice offered to:  NA  DME Arranged:  N/A DME Agency:  NA  HH Arranged:  NA HH Agency:  NA  Status of Service:  Completed, signed off  If discussed at [place] of Stay Meetings, dates discussed:    Additional Comments:  Adron Bene, RN 08/22/2015, 11:24 AM

## 2015-08-22 NOTE — Transfer of Care (Signed)
Immediate Anesthesia Transfer of Care Note  Patient: Wayne Meadows  Procedure(s) Performed: Procedure(s): COLONOSCOPY (N/A)  Patient Location: Endoscopy Unit  Anesthesia Type:MAC  Level of Consciousness: sedated  Airway & Oxygen Therapy: Patient Spontanous Breathing and Patient connected to face mask oxygen  Post-op Assessment: Report given to RN and Post -op Vital signs reviewed and stable  Post vital signs: Reviewed and stable  Last Vitals:  Filed Vitals:   08/22/15 0517 08/22/15 0725  BP: 151/71 166/65  Pulse: 81 86  Temp: 36.7 C 36.5 C  Resp: 18 12    Last Pain:  Filed Vitals:   08/22/15 0729  PainSc: 0-No pain         Complications: No apparent anesthesia complications

## 2015-08-22 NOTE — Anesthesia Procedure Notes (Signed)
Procedure Name: MAC Date/Time: 08/22/2015 8:08 AM Performed by: Merrilyn Puma B Pre-anesthesia Checklist: Patient identified, Emergency Drugs available, Suction available, Patient being monitored and Timeout performed Patient Re-evaluated:Patient Re-evaluated prior to inductionOxygen Delivery Method: Simple face mask Placement Confirmation: positive ETCO2 and breath sounds checked- equal and bilateral Dental Injury: Teeth and Oropharynx as per pre-operative assessment

## 2015-08-24 NOTE — Discharge Summary (Signed)
[facility] Discharge Summary   Patient: Wayne Meadows [id number]   PCP: PROVIDER NOT IN SYSTEM DOB: [birth year]   Date of admission: 08/18/2015   Date of discharge: 08/22/2015   Discharge Diagnoses:  Principal Problem:   Bright red blood per rectum Active Problems:   HLD (hyperlipidemia)   Hypertension   Acute blood loss anemia   Dysphagia   CAD in native artery   Renal cell cancer (HCC)   Essential hypertension   Chronic obstructive pulmonary disease (HCC)   Noninfectious gastroenteritis, unspecified   Benign neoplasm of transverse colon   Benign neoplasm of colon  Admitted From: Home Disposition:  Home  Recommendations for Outpatient Follow-up:  1. Follow-up with gastroenterology in 2 weeks. 2. Please follow-up with PCP in one week with CBC   Follow-up Information    Schedule an appointment as soon as possible for a visit in 1 week to follow up.   Why:  with PCP for CBC re check      Follow up with [facility]. Schedule an appointment as soon as possible for a visit in 2 weeks.   Specialty:  Gastroenterology   Contact information:   [address] [place] [phone]     Diet recommendation: Soft diet, cardiac  Activity: The patient is advised to gradually reintroduce usual activities.  Discharge Condition: good  Code Status: Full code  History of present illness: As per the H and P dictated on admission, "Wayne Meadows is a 81 y.o. male, w/ Copd(not on home o2), CAD s/p stent, CVA, apparently c/o rectal bleeding starting 2am. 08/18/2015 On the toilet paper and in the toilet. Occurred several times throughout the day. + abdominal pain " sharp" upper abdomen. + nausea. No emesis. Denies constipation, diarrhea, black stool. Pt denies fever, chills, lightheadedness, cp, palp, sob. "  Hospital Course:  Summary of his active problems in the hospital is as following. 1. BRBPR. Likely hemorrhoidal  bleeding. Presence of clot is concerning for diverticular bleeding. Colonoscopy on 08/22/2015 shows evidence of diverticulosis as well as polyps. There was an area which was concerning for possible colitis possible ischemic colitis. Biopsies were taken and GI will follow-up on this biopsies. I discussed with gastroenterology and felt that the patient can be safely discharged home with close follow-up with them.  2. GERD. Continue Protonix twice a day continue Carafate.  3. Dysphagia. Recent EGD 2 weeks ago. GI is following up on this.  4. HTN. Continue Lopressor.  5. COPD. Continue Pulmicort continue DuoNeb.  6. History of CVA. Initially holding antiplatelet and anticoagulation in the setting of ongoing bleeding. Later on after the procedure GI recommended to resume aspirin and Plavix.  All other chronic medical condition were stable during the hospitalization.  Patient was ambulatory without any assistance. On the day of the discharge the patient's hemoglobin was stable as well as vitals were stable and patient was able to tolerate oral diet after procedure, and no other acute medical condition were reported by patient. the patient was felt safe to be discharge at home with family.  Procedures and Results:  Colonoscopy with biopsy   Consultations:  Gastroenterology  DISCHARGE MEDICATION: Discharge Medication List as of 08/22/2015  2:04 PM    START taking these medications   Details  hydrocortisone (ANUSOL-HC) 25 MG suppository Place 1 suppository (25 mg total) rectally 2 (two) times daily., Starting 08/22/2015, Until Discontinued, Normal    polyethylene glycol (MIRALAX / GLYCOLAX) packet Take 17 g by mouth  daily., Starting 08/22/2015, Until Discontinued, Normal      CONTINUE these medications which have CHANGED   Details  amLODipine (NORVASC) 5 MG tablet Take 1 tablet (5 mg total) by mouth daily., Starting 08/22/2015, Until Discontinued, Normal      CONTINUE these  medications which have NOT CHANGED   Details  ARTIFICIAL TEAR OP Place 1 drop into both eyes 2 (two) times daily as needed (dry eyes). , Until Discontinued, Historical Med    aspirin EC 81 MG tablet Take 81 mg by mouth daily.  , Until Discontinued, Historical Med    clopidogrel (PLAVIX) 75 MG tablet Take 75 mg by mouth daily., Until Discontinued, Historical Med    cyanocobalamin 500 MCG tablet Take 500 mcg by mouth daily., Until Discontinued, Historical Med    docusate sodium (COLACE) 100 MG capsule Take 100 mg by mouth 2 (two) times daily., Until Discontinued, Historical Med    enalapril (VASOTEC) 20 MG tablet Take 20 mg by mouth at bedtime. , Until Discontinued, Historical Med    EPINEPHrine (EPI-PEN) 0.3 mg/0.3 mL DEVI Inject 0.3 mg into the muscle once., Historical Med    gabapentin (NEURONTIN) 300 MG capsule Take 300-600 mg by mouth 3 (three) times daily. Takes one capsule in the morning and one at noon, then takes two capsules at bedtime., Until Discontinued, Historical Med    HYDROcodone-acetaminophen (NORCO) 10-325 MG tablet Take 1 tablet by mouth 2 (two) times daily as needed for moderate pain. , Until Discontinued, Historical Med    Ipratropium-Albuterol (COMBIVENT) 20-100 MCG/ACT AERS respimat Inhale 1 puff into the lungs 4 (four) times daily., Until Discontinued, Historical Med    metoprolol tartrate (LOPRESSOR) 25 MG tablet Take 12.5 mg by mouth 2 (two) times daily., Until Discontinued, Historical Med    mometasone (ASMANEX) 220 MCG/INH inhaler Inhale 1 puff into the lungs at bedtime.  , Until Discontinued, Historical Med    Multiple Vitamin (MULTIVITAMIN) tablet Take 1 tablet by mouth daily., Until Discontinued, Historical Med    nitroGLYCERIN (NITROSTAT) 0.4 MG SL tablet Place 0.4 mg under the tongue every 5 (five) minutes as needed for chest pain., Until Discontinued, Historical Med    pantoprazole (PROTONIX) 20 MG tablet Take 1 tablet (20 mg total) by mouth 2 (two)  times daily before a meal., Starting 08/09/2015, Until Discontinued, Print    pravastatin (PRAVACHOL) 40 MG tablet Take 40 mg by mouth at bedtime., Until Discontinued, Historical Med    ranitidine (ZANTAC) 300 MG tablet Take 300 mg by mouth at bedtime., Until Discontinued, Historical Med    sucralfate (CARAFATE) 1 GM/10ML suspension Take 10 mLs (1 g total) by mouth 4 (four) times daily -  with meals and at bedtime., Starting 08/09/2015, Until Discontinued, Print      STOP taking these medications     HYDROcodone-acetaminophen (HYCET) 7.5-325 mg/15 ml solution      guaiFENesin (MUCINEX) 600 MG 12 hr tablet      levofloxacin (LEVAQUIN) 500 MG tablet        Allergies  Allergen Reactions  . Bee Venom Anaphylaxis    Has epi pen  . Influenza Vaccines Other (See Comments)    "Mortally sick for 2 weeks"   Discharge Instructions    Call MD for:  extreme fatigue    Complete by:  As directed      Call MD for:  persistant dizziness or light-headedness    Complete by:  As directed      Call MD for:  persistant nausea  and vomiting    Complete by:  As directed      Call MD for:  severe uncontrolled pain    Complete by:  As directed      DIET SOFT    Complete by:  As directed      Diet - low sodium heart healthy    Complete by:  As directed      Discharge instructions    Complete by:  As directed   It is important that you read following instructions as well as go over your medication list with RN to help you understand your care after this hospitalization.  Discharge Instructions: Please follow-up with PCP in one week with CBC Please call GI if you have not heard back until Tuesday 08/27/2015.  Please request your primary care physician to go over all Hospital Tests and Procedure/Radiological results at the follow up,  Please get all Hospital records sent to your PCP by signing hospital release before you go home.   You were cared for by a hospitalist during your hospital stay. If you  have any questions about your discharge medications or the care you received while you were in the hospital after you are discharged, you can call the unit and ask to speak with the hospitalist on call if the hospitalist that took care of you is not available.  Once you are discharged, your primary care physician will handle any further medical issues. Please note that NO REFILLS for any discharge medications will be authorized once you are discharged, as it is imperative that you return to your primary care physician (or establish a relationship with a primary care physician if you do not have one) for your aftercare needs so that they can reassess your need for medications and monitor your lab values. You Must read complete instructions/literature along with all the possible adverse reactions/side effects for all the Medicines you take and that have been prescribed to you. Take any new Medicines after you have completely understood and accept all the possible adverse reactions/side effects. Wear Seat belts while driving. If you have smoked or chewed Tobacco in the last 2 yrs please stop smoking and/or stop any Recreational drug use.     Increase activity slowly    Complete by:  As directed           Discharge Exam: Filed Weights   08/20/15 0500 08/20/15 2050 08/21/15 2206  Weight: 93.1 kg (205 lb 4 oz) 93.532 kg (206 lb 3.2 oz) 94.575 kg (208 lb 8 oz)   Filed Vitals:   08/22/15 0910 08/22/15 0937  BP: 134/91 143/58  Pulse: 76 76  Temp:  97.8 F (36.6 C)  Resp: 15 18   General: Appear in no distress, no Rash; Oral Mucosa moist. Cardiovascular: S1 and S2 Present, no Murmur, no JVD Respiratory: Bilateral Air entry present and Clear to Auscultation, no Crackles, no wheezes Abdomen: Bowel Sound present, Soft and no tenderness Extremities: no Pedal edema, no calf tenderness Neurology: Grossly no focal neuro deficit.  The results of significant diagnostics from this hospitalization  (including imaging, microbiology, ancillary and laboratory) are listed below for reference.    Significant Diagnostic Studies: Dg Neck Soft Tissue  08/09/2015  CLINICAL DATA:  Choked on piece of bread, with sore throat, fever and chills. Chest congestion. Initial encounter. EXAM: NECK SOFT TISSUES - 1+ VIEW COMPARISON:  CT of the neck performed 07/13/2013 FINDINGS: The nasopharynx, oropharynx and hypopharynx are grossly unremarkable. The epiglottis is normal in  thickness. Prevertebral soft tissues are within normal limits. No radiopaque foreign bodies are seen. Note that a piece of bread is unlikely to be visible on radiograph. Mild degenerative change is noted at the mid cervical spine, with disc space narrowing and anterior and posterior disc osteophyte complexes. The visualized lung apices are grossly clear. Scattered carotid calcifications are partially imaged. IMPRESSION: 1. No radiopaque foreign bodies seen. Note that a piece of bread is unlikely to be visible on radiograph. 2. Mild degenerative change at the mid cervical spine. Electronically Signed   By: Garald Balding M.D.   On: 08/09/2015 00:39   Dg Chest 2 View  08/09/2015  CLINICAL DATA:  Choked on a piece of bread.  Sore throat. EXAM: CHEST  2 VIEW COMPARISON:  04/08/2014.  10/25/2011 FINDINGS: Again noted is a triangular-shaped density at the right lung base which has minimally changed from the previous examination. This could represent an area of scarring. Remainder of the lungs are clear. Heart and mediastinum are within normal limits. The trachea is midline. Degenerative changes in thoracic spine. No large pleural effusions. IMPRESSION: Persistent density at the right lung base. This could represent scarring but indeterminate. No acute chest findings. Electronically Signed   By: Markus Daft M.D.   On: 08/09/2015 00:38   Ct Head Wo Contrast  08/09/2015  CLINICAL DATA:  Occipital headache, gone now. EXAM: CT HEAD WITHOUT CONTRAST TECHNIQUE:  Contiguous axial images were obtained from the base of the skull through the vertex without intravenous contrast. COMPARISON:  MRI brain 06/28/2014.  CT head 08/02/2006 FINDINGS: Mild cerebral atrophy. No ventricular dilatation. Focal low-attenuation area in the right posterior frontal lobe measuring about 2.3 cm diameter. Abnormality in this area was present on previous MRI, likely representing or area of ischemia. Ventricles and sulci are otherwise symmetrical. No abnormal extra-axial fluid collections. Gray-white matter junctions are distinct. Basal cisterns are not effaced. No acute intracranial hemorrhage. Calvarium appears intact. Mucosal thickening in the paranasal sinuses. Mastoid air cells are not opacified. Vascular calcifications. IMPRESSION: Old focal ischemia in the right posterior frontal lobe. Mild cortical atrophy. No acute intracranial abnormalities. Electronically Signed   By: Lucienne Capers M.D.   On: 08/09/2015 02:23   Ct Soft Tissue Neck W Contrast  08/09/2015  CLINICAL DATA:  Feels something in throat. Rule out mass or foreign body EXAM: CT NECK WITH CONTRAST TECHNIQUE: Multidetector CT imaging of the neck was performed using the standard protocol following the bolus administration of intravenous contrast. CONTRAST:  59mL ISOVUE-300 IOPAMIDOL (ISOVUE-300) INJECTION 61% COMPARISON:  CT neck 07/13/2013 FINDINGS: Pharynx and larynx: Normal pharynx. No mass or foreign body. Tonsils are normal. Tongue is normal. Epiglottis and larynx normal. No foreign body or mass in the proximal esophagus. Salivary glands: Parotid and submandibular glands normal bilaterally. No mass or stone Thyroid: Normal Lymph nodes: No pathologic adenopathy in the neck. Vascular: Chronic occlusion right internal carotid artery unchanged. Atherosclerotic calcification the carotid bifurcation bilaterally. Jugular vein patent bilaterally. Limited intracranial: Negative Visualized orbits: Negative Mastoids and visualized  paranasal sinuses: mucosal edema in the ethmoid and maxillary sinuses bilaterally. Mastoid sinus is clear. Skeleton: Advanced disc degeneration and spondylosis C4-5 and C5-6. No fracture or bony lesion. Upper chest: Lung apices clear with mild apical scarring bilaterally. No mass in the upper mediastinum. IMPRESSION: Negative for mass or foreign body in the pharynx or proximal esophagus. No acute abnormality in the neck Chronic occlusion right internal carotid artery. Electronically Signed   By: Franchot Gallo M.D.  On: 08/09/2015 07:10   Ct Abdomen Pelvis W Contrast  08/19/2015  CLINICAL DATA:  81 year old male with abdominal pain and rectal bleeding EXAM: CT ABDOMEN AND PELVIS WITH CONTRAST TECHNIQUE: Multidetector CT imaging of the abdomen and pelvis was performed using the standard protocol following bolus administration of intravenous contrast. CONTRAST:  168mL ISOVUE-300 IOPAMIDOL (ISOVUE-300) INJECTION 61% COMPARISON:  Abdominal radiograph dated 10/26/2011 and abdominal MRI dated 02/25/2011 FINDINGS: The visualized lung bases are clear. There is coronary vascular calcification. No intra-abdominal free air or free fluid. Cholecystectomy. A 2.2 cm ill-defined right hepatic lesion with apparent peripheral nodular enhancement is not characterized on this CT but corresponds to the lesion seen on the prior MRI and likely represents a hemangioma. The pancreas, spleen, and the adrenal glands appear unremarkable. There multiple bilateral renal hypodense lesions measuring up to 3.4 cm in the superior pole of the right kidney. The larger lesions represent cysts and the smaller lesions are not well characterized. There is no hydronephrosis on either side. The visualized ureters and urinary bladder appear unremarkable. The prostate and seminal vesicles are grossly unremarkable. There is sigmoid diverticulosis with muscular hypertrophy. There are scattered colonic diverticula. There is no active inflammatory changes.  There is no evidence of bowel obstruction. There is abutment of loops of small bowel to the anterior peritoneal wall and ventral hernia repair mesh compatible with adhesions. There is laxity of the anterior abdominal wall with protrusion of loops of small bowel. There is midline vertical anterior abdominal wall incisional scar. There is osteopenia with degenerative changes of the spine. There is disc space narrowing and anterior wedging and L1-L2. Lower lumbar posterior fixation screws noted. There is a total right hip arthroplasty. No acute fracture or dislocation. IMPRESSION: Extensive colonic diverticulosis. No active inflammatory changes. No bowel obstruction. Electronically Signed   By: Anner Crete M.D.   On: 08/19/2015 01:12    Microbiology: Recent Results (from the past 240 hour(s))  MRSA PCR Screening     Status: None   Collection Time: 08/19/15  9:00 AM  Result Value Ref Range Status   MRSA by PCR NEGATIVE NEGATIVE Final    Comment:        The GeneXpert MRSA Assay (FDA approved for NASAL specimens only), is one component of a comprehensive MRSA colonization surveillance program. It is not intended to diagnose MRSA infection nor to guide or monitor treatment for MRSA infections.      Labs: CBC:  Recent Labs Lab 08/18/15 2108 08/19/15 0445 08/19/15 1523 08/20/15 0243 08/21/15 1129  WBC 8.4 7.0 8.1 7.9  --   HGB 14.1 12.5* 13.4 12.9* 13.7  HCT 42.6 37.7* 40.4 39.2  --   MCV 90.8 90.2 91.2 91.2  --   PLT 366 324 361 336  --    Basic Metabolic Panel:  Recent Labs Lab 08/18/15 2108 08/19/15 0445 08/20/15 0243  NA 140 139 142  K 3.9 3.5 3.6  CL 109 109 110  CO2 23 24 24   GLUCOSE 95 89 92  BUN 8 8 9   CREATININE 1.11 1.05 1.11  CALCIUM 9.7 9.0 9.3   Liver Function Tests:  Recent Labs Lab 08/18/15 2108 08/19/15 0445 08/20/15 0243  AST 25 23 25   ALT 23 19 21   ALKPHOS 57 46 50  BILITOT 0.4 0.8 0.6  PROT 7.5 6.7 6.5  ALBUMIN 4.2 3.6 3.5    Recent  Labs Lab 08/18/15 2108  LIPASE 31   No results for input(s): AMMONIA in the last 168 hours.  Cardiac Enzymes:  Recent Labs Lab 08/19/15 0009  TROPONINI <0.03   BNP (last 3 results) No results for input(s): BNP in the last 8760 hours. CBG: No results for input(s): GLUCAP in the last 168 hours. Time spent: 30 minutes  Signed:  Khamron Gellert  [facility] 08/22/2015 , 12:35 AM

## 2015-08-26 ENCOUNTER — Other Ambulatory Visit: Payer: Self-pay

## 2015-08-26 ENCOUNTER — Telehealth: Payer: Self-pay | Admitting: Gastroenterology

## 2015-08-26 ENCOUNTER — Other Ambulatory Visit: Payer: Self-pay | Admitting: Gastroenterology

## 2015-08-26 MED ORDER — MESALAMINE 400 MG PO CPDR: 800.0000 mg | DELAYED_RELEASE_CAPSULE | Freq: Three times a day (TID) | ORAL | Status: DC

## 2015-08-26 MED ORDER — MESALAMINE 800 MG PO TBEC: 800.0000 mg | DELAYED_RELEASE_TABLET | Freq: Three times a day (TID) | ORAL | Status: DC

## 2015-08-26 NOTE — Telephone Encounter (Signed)
The cost of Mesalamine is $600.00. His insurance requires a PA.  I have tried changing the type of Mesalamine to see if that will be covered.  We do not have a copy of his insurance card.  I have asked the pharmacy to send me his ID#

## 2015-08-29 NOTE — Telephone Encounter (Signed)
Dr Silverio Decamp can we try Pentasa for this patient  And how would you want to prescribe it  Thanks

## 2015-08-29 NOTE — Telephone Encounter (Signed)
Patient called back states he is returning Beth's phone call from 7/3.

## 2015-08-29 NOTE — Telephone Encounter (Signed)
PA submitted for medication. It is not on the [facility] formulary.

## 2015-09-01 NOTE — Telephone Encounter (Signed)
Ok to do Pentasa, Please send Rx for 1gm TID. Thanks

## 2015-09-02 ENCOUNTER — Other Ambulatory Visit: Payer: Self-pay

## 2015-09-02 MED ORDER — MESALAMINE ER 500 MG PO CPCR: 1000.0000 mg | ORAL_CAPSULE | Freq: Four times a day (QID) | ORAL | Status: DC

## 2015-09-02 NOTE — Telephone Encounter (Signed)
Robin I have sent in the new Rx, but I won't know if it is not covered. Would you take this over please? The patient is aware the medication would be changed. Thanks

## 2015-09-02 NOTE — Telephone Encounter (Signed)
Received prior auth request today, will work on

## 2015-09-03 NOTE — Telephone Encounter (Signed)
Patient is actually coming it to see Alonza Bogus tomorrow on 7/12 at 10:30am  Will discuss with [employer] alternatives for a mesalamine Gave Pam the prior auth request, patient has never tried and failed another Mesalamine

## 2015-09-04 ENCOUNTER — Encounter: Payer: Self-pay | Admitting: Gastroenterology

## 2015-09-04 ENCOUNTER — Ambulatory Visit (INDEPENDENT_AMBULATORY_CARE_PROVIDER_SITE_OTHER): Payer: Medicare Other | Admitting: Gastroenterology

## 2015-09-04 VITALS — BP 122/58 | HR 78 | Ht 74.0 in | Wt 212.0 lb

## 2015-09-04 DIAGNOSIS — K5010 Crohn's disease of large intestine without complications: Secondary | ICD-10-CM | POA: Diagnosis not present

## 2015-09-04 DIAGNOSIS — R1084 Generalized abdominal pain: Secondary | ICD-10-CM

## 2015-09-04 NOTE — Progress Notes (Signed)
     09/04/2015 Wayne Meadows [id number] 18-Jan-1935   History of Present Illness:  This is a 81 year old male who is here for hospital follow-up.  Had colonoscopy 09/16/2009 from the [place] at which time he had 7 polyps removed. We do not have pathology on those, however. He also had diverticulosis noted throughout the colon. That same day is when he had a laparotomy for repair of perforated colon associated with polypectomy.  Anyway, he is here for recent hospital follow-up of abdominal pain and rectal bleeding.  Underwent colonoscopy on June 29 by Dr. Silverio Decamp. Was found to have inadequate prep. 4 polyps were removed, some tubular adenomas and some hyperplastic, had some granularity in the sigmoid colon that was biopsied. Had diverticulosis and internal hemorrhoids.  Biopsies of the area of granularity in the sigmoid colon showed some active chronic colitis that they said could be segmental colitis associated with diverticulitis. It was advised that he start on a daily mesalamine, but he has not yet done so since the Pentasa he was prescribed was not covered by insurance. He says that he is feeling much better. Has not experienced any further rectal bleeding and abdominal pain is minimal.  Says that overall he feels well.  Patient not having any more rectal bleeding.  Is on Plavix.  Not very interested in hemorrhoid banding at this time.   Current Medications, Allergies, Past Medical History, Past Surgical History, Family History and Social History were reviewed in [employer] record.   Physical Exam: BP 122/58 mmHg  Pulse 78  Ht 6\' 2"  (1.88 m)  Wt 212 lb (96.163 kg)  BMI 27.21 kg/m2 General: Well developed white male in no acute distress Head: Normocephalic and atraumatic Eyes:  Sclerae anicteric, conjunctiva pink  Ears: Normal auditory acuity Lungs: Clear throughout to auscultation Heart: Regular rate and rhythm Abdomen: Soft, non-distended.  Laparotomy scar  noted with associated large incisional and umbilical hernias.  Normal bowel sounds.  Non-tender. Musculoskeletal: Symmetrical with no gross deformities  Extremities: No edema  Neurological: Alert oriented x 4, grossly non-focal Psychological:  Alert and cooperative. 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 Has not yet started mesalamine due to insurance issues. He is feeling much better so he may not need that at this time but is going to check with the [facility] on possibly getting some alternate. I recommended possibly trying to get Lialda 4 g daily. -Internal hemorrhoids:  No further bleeding.  Is on Plavix.  Going to hold off with banding at this time.   -Colon polyps:  7 polyps removed in 2011 with no pathology available. Polyps removed, some tubular adenomas, on recent colonoscopy as well with an adequate prep. Currently there is no plan to repeat colonoscopy due to his age, however. -Abdominal pain:  Some of his abdominal pain is ongoing and may certainly be related to scar tissue/hernia from his surgery.  *We did receive records from his previous colonoscopy in 09/16/2009 from the [place] at which time he had 7 polyps removed. We do not have pathology on those, however. He also had diverticulosis noted throughout the colon. That same day is when he had a laparotomy for repair of perforated colon associated with polypectomy.

## 2015-09-04 NOTE — Progress Notes (Signed)
Reviewed and agree with documentation and assessment and plan. K. Veena Nandigam , MD   

## 2015-09-04 NOTE — Patient Instructions (Signed)
We have given you records we printed for the PA , Janett Billow today for your office visit with [place] to take to the [place].  We can fax Jessica's note to the [place] or you can pick it up tomorrow if you so desire.

## 2015-11-14 ENCOUNTER — Encounter (HOSPITAL_COMMUNITY): Payer: Self-pay

## 2015-11-14 ENCOUNTER — Emergency Department (HOSPITAL_COMMUNITY)
Admission: EM | Admit: 2015-11-14 | Discharge: 2015-11-15 | Disposition: A | Discharge status: Home / Self Care | Payer: Medicare Other | Attending: Emergency Medicine | Admitting: Emergency Medicine

## 2015-11-14 ENCOUNTER — Emergency Department (HOSPITAL_COMMUNITY): Payer: Medicare Other

## 2015-11-14 DIAGNOSIS — R05 Cough: Secondary | ICD-10-CM | POA: Diagnosis not present

## 2015-11-14 DIAGNOSIS — I2510 Atherosclerotic heart disease of native coronary artery without angina pectoris: Secondary | ICD-10-CM | POA: Insufficient documentation

## 2015-11-14 DIAGNOSIS — Z7982 Long term (current) use of aspirin: Secondary | ICD-10-CM | POA: Insufficient documentation

## 2015-11-14 DIAGNOSIS — I10 Essential (primary) hypertension: Secondary | ICD-10-CM | POA: Insufficient documentation

## 2015-11-14 DIAGNOSIS — Z79899 Other long term (current) drug therapy: Secondary | ICD-10-CM | POA: Insufficient documentation

## 2015-11-14 DIAGNOSIS — Z85528 Personal history of other malignant neoplasm of kidney: Secondary | ICD-10-CM | POA: Diagnosis not present

## 2015-11-14 DIAGNOSIS — J449 Chronic obstructive pulmonary disease, unspecified: Secondary | ICD-10-CM | POA: Diagnosis not present

## 2015-11-14 DIAGNOSIS — R0602 Shortness of breath: Secondary | ICD-10-CM | POA: Diagnosis not present

## 2015-11-14 DIAGNOSIS — Z87891 Personal history of nicotine dependence: Secondary | ICD-10-CM | POA: Diagnosis not present

## 2015-11-14 DIAGNOSIS — R069 Unspecified abnormalities of breathing: Secondary | ICD-10-CM | POA: Diagnosis not present

## 2015-11-14 LAB — I-STAT TROPONIN, ED: TROPONIN I, POC: 0 ng/mL (ref 0.00–0.08)

## 2015-11-14 MED ORDER — MAGNESIUM SULFATE 2 GM/50ML IV SOLN
2.0000 g | Freq: Once | INTRAVENOUS | Status: AC
  Administered 2015-11-15: 2 g via INTRAVENOUS
  Filled 2015-11-14: qty 50

## 2015-11-14 MED ORDER — IPRATROPIUM-ALBUTEROL 0.5-2.5 (3) MG/3ML IN SOLN
3.0000 mL | RESPIRATORY_TRACT | Status: AC
  Administered 2015-11-15 (×3): 3 mL via RESPIRATORY_TRACT
  Filled 2015-11-14: qty 9

## 2015-11-14 NOTE — ED Triage Notes (Signed)
Pt BIB [facility] from home c/o shortness of breath. States that it started about an hour and half ago. Hx of COPD. Used Albuterol inhaler x1 today. Hx of pneumonia 1 month ago. 91% RA on EMS arrival.  10 mg of Albuterol, 0.5 Atrovent, 125 mg Solumedrol en route

## 2015-11-14 NOTE — ED Notes (Signed)
EKG given to EDP,Floyd,MD., for review. 

## 2015-11-14 NOTE — ED Notes (Signed)
Bed: WA17 Expected date:  Expected time:  Means of arrival:  Comments: 81 yr old, respiratory distress

## 2015-11-14 NOTE — ED Notes (Signed)
Pt transported to XR.  

## 2015-11-15 DIAGNOSIS — R0602 Shortness of breath: Secondary | ICD-10-CM | POA: Diagnosis not present

## 2015-11-15 LAB — CBC WITH DIFFERENTIAL/PLATELET
BASOS ABS: 0 10*3/uL (ref 0.0–0.1)
Basophils Relative: 0 %
EOS ABS: 0.3 10*3/uL (ref 0.0–0.7)
Eosinophils Relative: 2 %
HEMATOCRIT: 37.3 % — AB (ref 39.0–52.0)
Hemoglobin: 13 g/dL (ref 13.0–17.0)
LYMPHS ABS: 2.2 10*3/uL (ref 0.7–4.0)
Lymphocytes Relative: 14 %
MCH: 31.1 pg (ref 26.0–34.0)
MCHC: 34.9 g/dL (ref 30.0–36.0)
MCV: 89.2 fL (ref 78.0–100.0)
MONO ABS: 1.2 10*3/uL — AB (ref 0.1–1.0)
MONOS PCT: 8 %
Neutro Abs: 11.7 10*3/uL — ABNORMAL HIGH (ref 1.7–7.7)
Neutrophils Relative %: 76 %
PLATELETS: 290 10*3/uL (ref 150–400)
RBC: 4.18 MIL/uL — AB (ref 4.22–5.81)
RDW: 13.1 % (ref 11.5–15.5)
WBC: 15.4 10*3/uL — AB (ref 4.0–10.5)

## 2015-11-15 LAB — BASIC METABOLIC PANEL
ANION GAP: 7 (ref 5–15)
BUN: 14 mg/dL (ref 6–20)
CALCIUM: 8.9 mg/dL (ref 8.9–10.3)
CO2: 23 mmol/L (ref 22–32)
Chloride: 108 mmol/L (ref 101–111)
Creatinine, Ser: 1.25 mg/dL — ABNORMAL HIGH (ref 0.61–1.24)
GFR, EST NON AFRICAN AMERICAN: 53 mL/min — AB (ref >60)
Glucose, Bld: 128 mg/dL — ABNORMAL HIGH (ref 65–99)
Potassium: 4 mmol/L (ref 3.5–5.1)
SODIUM: 138 mmol/L (ref 135–145)

## 2015-11-15 LAB — BRAIN NATRIURETIC PEPTIDE: B NATRIURETIC PEPTIDE 5: 17.2 pg/mL (ref 0.0–100.0)

## 2015-11-15 MED ORDER — PREDNISONE 20 MG PO TABS: ORAL_TABLET | ORAL | 0 refills | Status: DC

## 2015-11-15 NOTE — ED Provider Notes (Signed)
[facility] DEPT Provider Note   CSN: [id number] Arrival date & time: 11/14/15  2307     History   Chief Complaint Chief Complaint  Patient presents with  . Shortness of Breath    HPI Wayne Meadows is a 81 y.o. male.  81 yo M with a chief complaint of shortness of breath. Patient went to sleep and woke up and felt like he was having difficulty breathing. Having a significant cough subjective fevers and chills. Denies any symptoms prior to this event. Feel that he is at something stuck but is unable to cough it out. Denies sick contacts. Denies chest pain denies lower extremity edema denies recent travel denies hemoptysis. Symptoms are mildly improved after having a continuous albuterol treatment en route as well as Solu-Medrol.   The history is provided by the patient.  Shortness of Breath  This is a new problem. The average episode lasts 1 hour. The problem occurs continuously.The current episode started less than 1 hour ago. The problem has been gradually improving. Associated symptoms include cough. Pertinent negatives include no fever, no headaches, no chest pain, no vomiting, no abdominal pain and no rash. He has tried nothing for the symptoms. The treatment provided no relief. Associated medical issues include COPD, chronic lung disease, CAD and past MI. Associated medical issues do not include PE, heart failure or DVT.    Past Medical History:  Diagnosis Date  . Arthritis   . Carotid stenosis   . COPD (chronic obstructive pulmonary disease) ([place])   . Coronary artery disease    S/p PCI 2011;  NSTEMI 12/12:  LHC/PCI 02/23/11: LAD 60% after the septal perforator, D1 occluded with distal collaterals, proximal RI 30-40%, AV circumflex stent patent with 60% stenosis after the stent, RCA 99%, EF 60-65%.  His RCA was treated with a bare-metal stent  . CVA (cerebral infarction) 2011   Right cerebral; total obstruction of the right ICA  . Diverticulitis   . Hypertension   .  Rectal bleeding 07/2015  . Renal carcinoma ([place])   . Tobacco abuse, in remission     Patient Active Problem List   Diagnosis Date Noted  . Segmental colitis ([place]) 09/04/2015  . Generalized abdominal pain 09/04/2015  . Noninfectious gastroenteritis, unspecified   . Benign neoplasm of transverse colon   . Benign neoplasm of colon   . Bright red blood per rectum   . Acute blood loss anemia   . Dysphagia   . CAD in native artery   . Renal cell cancer ([place])   . Essential hypertension   . Chronic obstructive pulmonary disease ([place])   . Blood in stool 08/19/2015  . Rectal bleeding 08/19/2015  . Chest pain 04/08/2014  . CAP (community acquired pneumonia) 04/08/2014  . Pneumonia involving right lung 04/08/2014  . Overweight (BMI 25.0-29.9) 04/08/2014  . Healthcare-associated pneumonia 10/26/2011  . Cholelithiasis 04/26/2011  . Atrial fibrillation ([place]) 04/26/2011  . Chest pain 03/19/2011  . Hypertension 03/19/2011  . Unstable angina ([place]) 03/18/2011    Class: Acute  . Lung nodule 03/02/2011  . Liver lesion 03/02/2011  . HLD (hyperlipidemia) 03/02/2011  . Abnormal abdominal CT scan 02/23/2011  . Non Q wave myocardial infarction ([place]) 02/22/2011    Class: Acute  . Cerebrovascular disease 02/22/2011  . Coronary atherosclerosis of native coronary artery 02/22/2011  . COPD (chronic obstructive pulmonary disease) ([place]) 02/22/2011  . Tobacco abuse, 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/22/2011    Past Surgical History:  Procedure Laterality Date  . COLON SURGERY    .  COLONOSCOPY N/A 08/22/2015   Procedure: COLONOSCOPY;  Surgeon: [name], MD;  Location: [facility];  Service: Endoscopy;  Laterality: N/A;  . hip relacement    . KIDNEY SURGERY    . LEFT HEART CATHETERIZATION WITH CORONARY ANGIOGRAM N/A 02/23/2011   Procedure: LEFT HEART CATHETERIZATION WITH CORONARY ANGIOGRAM;  Surgeon: [name], MD;  Location[facility] CATH LAB;  Service: Cardiovascular;  Laterality: N/A;  . LEFT HEART  CATHETERIZATION WITH CORONARY ANGIOGRAM N/A 03/18/2011   Procedure: LEFT HEART CATHETERIZATION WITH CORONARY ANGIOGRAM;  Surgeon: [name], MD;  Location: [facility] CATH LAB;  Service: Cardiovascular;  Laterality: N/A;  . PERCUTANEOUS CORONARY STENT INTERVENTION (PCI-S) N/A 02/23/2011   Procedure: PERCUTANEOUS CORONARY STENT INTERVENTION (PCI-S);  Surgeon: [name], MD;  Location: [facility] CATH LAB;  Service: Cardiovascular;  Laterality: N/A;  . TEMPORARY PACEMAKER INSERTION N/A 02/23/2011   Procedure: TEMPORARY PACEMAKER INSERTION;  Surgeon: [name], MD;  Location: [facility] CATH LAB;  Service: Cardiovascular;  Laterality: N/A;       Home Medications    Prior to Admission medications   Medication Sig Start Date End Date Taking? Authorizing Provider  albuterol (PROVENTIL HFA;VENTOLIN HFA) 108 (90 Base) MCG/ACT inhaler Inhale 1-2 puffs into the lungs every 6 (six) hours as needed for wheezing or shortness of breath.   Yes Historical Provider, MD  amLODipine (NORVASC) 5 MG tablet Take 5 mg by mouth daily.   Yes Historical Provider, MD  ARTIFICIAL TEAR OP Place 1 drop into both eyes 2 (two) times daily as needed (dry eyes).    Yes Historical Provider, MD  aspirin EC 81 MG tablet Take 81 mg by mouth daily.     Yes Historical Provider, MD  baclofen (LIORESAL) 10 MG tablet Take 10 mg by mouth at bedtime as needed for muscle spasms.   Yes Historical Provider, MD  calcium-vitamin D (OSCAL WITH D) 500-200 MG-UNIT tablet Take 1 tablet by mouth daily with breakfast.   Yes Historical Provider, MD  clopidogrel (PLAVIX) 75 MG tablet Take 75 mg by mouth daily.   Yes Historical Provider, MD  cyanocobalamin 500 MCG tablet Take 500 mcg by mouth daily.   Yes Historical Provider, MD  enalapril (VASOTEC) 20 MG tablet Take 20 mg by mouth at bedtime.    Yes Historical Provider, MD  EPINEPHrine (EPI-PEN) 0.3 mg/0.3 mL DEVI Inject 0.3 mg into the muscle once.   Yes Historical Provider, MD  gabapentin (NEURONTIN) 300  MG capsule Take 300-600 mg by mouth 3 (three) times daily. Takes one capsule in the morning and one at noon, then takes two capsules at bedtime.   Yes Historical Provider, MD  HYDROcodone-acetaminophen (NORCO) 10-325 MG tablet Take 1 tablet by mouth 2 (two) times daily as needed for moderate pain.    Yes Historical Provider, MD  Ipratropium-Albuterol (COMBIVENT) 20-100 MCG/ACT AERS respimat Inhale 1 puff into the lungs 4 (four) times daily.   Yes Historical Provider, MD  MESALAMINE PO Take 1,500 mg by mouth daily.   Yes Historical Provider, MD  metoprolol tartrate (LOPRESSOR) 25 MG tablet Take 12.5 mg by mouth 2 (two) times daily.   Yes Historical Provider, MD  mometasone (ASMANEX) 220 MCG/INH inhaler Inhale 1 puff into the lungs at bedtime.     Yes Historical Provider, MD  Multiple Vitamin (MULTIVITAMIN) tablet Take 1 tablet by mouth daily.   Yes Historical Provider, MD  nitroGLYCERIN (NITROSTAT) 0.4 MG SL tablet Place 0.4 mg under the tongue every 5 (five) minutes as needed  for chest pain.   Yes Historical Provider, MD  Omega-3 Fatty Acids (FISH OIL) 1200 MG CAPS Take 1 capsule by mouth daily.   Yes Historical Provider, MD  pantoprazole (PROTONIX) 20 MG tablet Take 1 tablet (20 mg total) by mouth 2 (two) times daily before a meal. 08/09/15  Yes Orpah Greek, MD  pravastatin (PRAVACHOL) 40 MG tablet Take 40 mg by mouth at bedtime.   Yes Historical Provider, MD  amLODipine (NORVASC) 5 MG tablet Take 1 tablet (5 mg total) by mouth daily. Patient not taking: Reported on 11/14/2015 08/22/15   Lavina Hamman, MD  hydrocortisone (ANUSOL-HC) 25 MG suppository Place 1 suppository (25 mg total) rectally 2 (two) times daily. Patient not taking: Reported on 09/04/2015 08/22/15   Lavina Hamman, MD  polyethylene glycol [facility] / Floria Raveling) packet Take 17 g by mouth daily. Patient taking differently: Take 17 g by mouth daily as needed.  08/22/15   Lavina Hamman, MD  predniSONE (DELTASONE) 20 MG tablet 2  tabs po daily x 4 days 11/15/15   Deno Etienne, DO    Family History Family History  Problem Relation Age of Onset  . Heart attack  3    Social History Social History  Substance Use Topics  . Smoking status: Former Smoker    Types: Cigarettes    Quit date: 02/24/2007  . Smokeless tobacco: Never Used  . Alcohol use 0.6 oz/week    1 Standard drinks or equivalent per week     Comment: socially      Allergies   Bee venom and Influenza vaccines   Review of Systems Review of Systems  Constitutional: Positive for fatigue (subjective). Negative for chills and fever.  HENT: Negative for congestion and facial swelling.   Eyes: Negative for discharge and visual disturbance.  Respiratory: Positive for cough and shortness of breath.   Cardiovascular: Negative for chest pain and palpitations.  Gastrointestinal: Negative for abdominal pain, diarrhea and vomiting.  Musculoskeletal: Negative for arthralgias and myalgias.  Skin: Negative for color change and rash.  Neurological: Negative for tremors, syncope and headaches.  Psychiatric/Behavioral: Negative for confusion and dysphoric mood.     Physical Exam Updated Vital Signs BP (!) 165/102 (BP Location: Right Arm)   Pulse 110   Temp 98.5 F (36.9 C) (Oral)   Resp 18   SpO2 98%   Physical Exam  Constitutional: He is oriented to person, place, and time. He appears well-developed and well-nourished.  HENT:  Head: Normocephalic and atraumatic.  Eyes: EOM are normal. Pupils are equal, round, and reactive to light.  Neck: Normal range of motion. Neck supple. No JVD present.  Cardiovascular: Normal rate and regular rhythm.  Exam reveals no gallop and no friction rub.   No murmur heard. Pulmonary/Chest: No respiratory distress. He has no wheezes.  Prolonged expiration, transmitted upper airway noises.  Abdominal: He exhibits no distension and no mass. There is no tenderness. There is no rebound and no guarding.  Musculoskeletal:  Normal range of motion.  Neurological: He is alert and oriented to person, place, and time.  Skin: No rash noted. No pallor.  Psychiatric: He has a normal mood and affect. His behavior is normal.  Nursing note and vitals reviewed.    ED Treatments / Results  Labs (all labs ordered are listed, but only abnormal results are displayed) Labs Reviewed  CBC WITH DIFFERENTIAL/PLATELET - Abnormal; Notable for the following:       Result Value   WBC 15.4 (*)  RBC 4.18 (*)    HCT 37.3 (*)    Neutro Abs 11.7 (*)    Monocytes Absolute 1.2 (*)    All other components within normal limits  BASIC METABOLIC PANEL - Abnormal; Notable for the following:    Glucose, Bld 128 (*)    Creatinine, Ser 1.25 (*)    GFR calc non Af Amer 53 (*)    All other components within normal limits  BRAIN NATRIURETIC PEPTIDE  I-STAT TROPOININ, ED    EKG  EKG Interpretation  Date/Time:  Thursday November 14 2015 23:11:49 EDT Ventricular Rate:  112 PR Interval:    QRS Duration: 95 QT Interval:  313 QTC Calculation: 428 R Axis:   85 Text Interpretation:  Sinus tachycardia Anterior infarct, old No significant change since last tracing Confirmed by Teela Narducci MD, DANIEL [phone]) on 11/14/2015 11:48:54 PM       Radiology No results found.  Procedures Procedures (including critical care time)  Medications Ordered in ED Medications  magnesium sulfate IVPB 2 g 50 mL (2 g Intravenous New Bag/Given 11/15/15 0028)  ipratropium-albuterol (DUONEB) 0.5-2.5 (3) MG/3ML nebulizer solution 3 mL (3 mLs Nebulization Given 11/15/15 0017)     Initial Impression / Assessment and Plan / ED Course  I have reviewed the triage vital signs and the nursing notes.  Pertinent labs & imaging results that were available during my care of the patient were reviewed by me and considered in my medical decision making (see chart for details).  Clinical Course  Comment By Time  Leukocytosis, ? Steroid administration. Wet read CXR with  no pna, just COPD without acute changes Deno Etienne, DO 09/22 0055    81 yo M With a chief complaint of shortness of breath. I suspect this is related to posterior nasal drip, chest x-ray is negative. Patient has a leukocytosis which may be related to recent steroid administration. Patient with no focal lung findings. All transmitted upper airway noises. Was able to ambulate without difficulty and did not become hypoxic. Discharge home with PCP follow-up.  1:07 AM:  I have discussed the diagnosis/risks/treatment options with the patient and family and believe the pt to be eligible for discharge home to follow-up with PCP. We also discussed returning to the ED immediately if new or worsening sx occur. We discussed the sx which are most concerning (e.g., sudden worsening sob, fever) that necessitate immediate return. Medications administered to the patient during their visit and any new prescriptions provided to the patient are listed below.  Medications given during this visit Medications  magnesium sulfate IVPB 2 g 50 mL (2 g Intravenous New Bag/Given 11/15/15 0028)  ipratropium-albuterol (DUONEB) 0.5-2.5 (3) MG/3ML nebulizer solution 3 mL (3 mLs Nebulization Given 11/15/15 0017)     The patient appears reasonably screen and/or stabilized for discharge and I doubt any other medical condition or other [facility] requiring further screening, evaluation, or treatment in the ED at this time prior to discharge.    Final Clinical Impressions(s) / ED Diagnoses   Final diagnoses:  SOB (shortness of breath)    New Prescriptions New Prescriptions   PREDNISONE (DELTASONE) 20 MG TABLET    2 tabs po daily x 4 days     Deno Etienne, DO 11/15/15 0107

## 2015-11-15 NOTE — ED Notes (Signed)
MD made aware of pulse ox while ambulating

## 2015-11-15 NOTE — ED Notes (Signed)
Pt ambulated down the hall with the Pulse ox. SpO2 level remained between 90-92% throughout ambulation.

## 2015-11-15 NOTE — Discharge Instructions (Signed)
Use your inhaler every 4 hours while awake for the next couple of days.

## 2016-04-11 ENCOUNTER — Emergency Department (HOSPITAL_COMMUNITY): Payer: Medicare Other

## 2016-04-11 ENCOUNTER — Inpatient Hospital Stay (HOSPITAL_COMMUNITY)
Admission: EM | Admit: 2016-04-11 | Discharge: 2016-04-16 | DRG: 871 | Disposition: A | Discharge status: Home / Self Care | Payer: Medicare Other | Attending: Family Medicine | Admitting: Family Medicine

## 2016-04-11 ENCOUNTER — Encounter (HOSPITAL_COMMUNITY): Payer: Self-pay | Admitting: Emergency Medicine

## 2016-04-11 DIAGNOSIS — I129 Hypertensive chronic kidney disease with stage 1 through stage 4 chronic kidney disease, or unspecified chronic kidney disease: Secondary | ICD-10-CM | POA: Diagnosis present

## 2016-04-11 DIAGNOSIS — I10 Essential (primary) hypertension: Secondary | ICD-10-CM | POA: Diagnosis not present

## 2016-04-11 DIAGNOSIS — A419 Sepsis, unspecified organism: Secondary | ICD-10-CM | POA: Diagnosis present

## 2016-04-11 DIAGNOSIS — R740 Nonspecific elevation of levels of transaminase and lactic acid dehydrogenase [LDH]: Secondary | ICD-10-CM | POA: Diagnosis not present

## 2016-04-11 DIAGNOSIS — R7989 Other specified abnormal findings of blood chemistry: Secondary | ICD-10-CM | POA: Diagnosis not present

## 2016-04-11 DIAGNOSIS — Z7952 Long term (current) use of systemic steroids: Secondary | ICD-10-CM

## 2016-04-11 DIAGNOSIS — Z85528 Personal history of other malignant neoplasm of kidney: Secondary | ICD-10-CM

## 2016-04-11 DIAGNOSIS — N289 Disorder of kidney and ureter, unspecified: Secondary | ICD-10-CM | POA: Diagnosis not present

## 2016-04-11 DIAGNOSIS — J181 Lobar pneumonia, unspecified organism: Secondary | ICD-10-CM | POA: Diagnosis not present

## 2016-04-11 DIAGNOSIS — R17 Unspecified jaundice: Secondary | ICD-10-CM | POA: Diagnosis present

## 2016-04-11 DIAGNOSIS — N183 Chronic kidney disease, stage 3 unspecified: Secondary | ICD-10-CM | POA: Diagnosis present

## 2016-04-11 DIAGNOSIS — I2583 Coronary atherosclerosis due to lipid rich plaque: Secondary | ICD-10-CM

## 2016-04-11 DIAGNOSIS — Z9861 Coronary angioplasty status: Secondary | ICD-10-CM

## 2016-04-11 DIAGNOSIS — Z8673 Personal history of transient ischemic attack (TIA), and cerebral infarction without residual deficits: Secondary | ICD-10-CM | POA: Diagnosis not present

## 2016-04-11 DIAGNOSIS — J432 Centrilobular emphysema: Secondary | ICD-10-CM | POA: Diagnosis present

## 2016-04-11 DIAGNOSIS — I700 Atherosclerosis of aorta: Secondary | ICD-10-CM | POA: Diagnosis present

## 2016-04-11 DIAGNOSIS — R6520 Severe sepsis without septic shock: Secondary | ICD-10-CM | POA: Diagnosis present

## 2016-04-11 DIAGNOSIS — Z91030 Bee allergy status: Secondary | ICD-10-CM

## 2016-04-11 DIAGNOSIS — Z79899 Other long term (current) drug therapy: Secondary | ICD-10-CM

## 2016-04-11 DIAGNOSIS — I4891 Unspecified atrial fibrillation: Secondary | ICD-10-CM | POA: Diagnosis present

## 2016-04-11 DIAGNOSIS — E785 Hyperlipidemia, unspecified: Secondary | ICD-10-CM | POA: Diagnosis present

## 2016-04-11 DIAGNOSIS — J441 Chronic obstructive pulmonary disease with (acute) exacerbation: Secondary | ICD-10-CM | POA: Diagnosis present

## 2016-04-11 DIAGNOSIS — Z87891 Personal history of nicotine dependence: Secondary | ICD-10-CM

## 2016-04-11 DIAGNOSIS — Z7902 Long term (current) use of antithrombotics/antiplatelets: Secondary | ICD-10-CM

## 2016-04-11 DIAGNOSIS — K529 Noninfective gastroenteritis and colitis, unspecified: Secondary | ICD-10-CM | POA: Diagnosis present

## 2016-04-11 DIAGNOSIS — I2510 Atherosclerotic heart disease of native coronary artery without angina pectoris: Secondary | ICD-10-CM | POA: Diagnosis present

## 2016-04-11 DIAGNOSIS — Z7982 Long term (current) use of aspirin: Secondary | ICD-10-CM | POA: Diagnosis not present

## 2016-04-11 DIAGNOSIS — I252 Old myocardial infarction: Secondary | ICD-10-CM

## 2016-04-11 DIAGNOSIS — J180 Bronchopneumonia, unspecified organism: Secondary | ICD-10-CM | POA: Diagnosis present

## 2016-04-11 DIAGNOSIS — J189 Pneumonia, unspecified organism: Secondary | ICD-10-CM | POA: Diagnosis present

## 2016-04-11 DIAGNOSIS — R0603 Acute respiratory distress: Secondary | ICD-10-CM | POA: Diagnosis present

## 2016-04-11 DIAGNOSIS — Z8249 Family history of ischemic heart disease and other diseases of the circulatory system: Secondary | ICD-10-CM

## 2016-04-11 HISTORY — DX: Sepsis, unspecified organism: A41.9

## 2016-04-11 LAB — COMPREHENSIVE METABOLIC PANEL
ALBUMIN: 3.8 g/dL (ref 3.5–5.0)
ALT: 21 U/L (ref 17–63)
ANION GAP: 9 (ref 5–15)
AST: 27 U/L (ref 15–41)
Alkaline Phosphatase: 57 U/L (ref 38–126)
BUN: 15 mg/dL (ref 6–20)
CHLORIDE: 107 mmol/L (ref 101–111)
CO2: 22 mmol/L (ref 22–32)
Calcium: 9.1 mg/dL (ref 8.9–10.3)
Creatinine, Ser: 1.39 mg/dL — ABNORMAL HIGH (ref 0.61–1.24)
GFR calc Af Amer: 54 mL/min — ABNORMAL LOW (ref >60)
GFR calc non Af Amer: 47 mL/min — ABNORMAL LOW (ref >60)
Glucose, Bld: 145 mg/dL — ABNORMAL HIGH (ref 65–99)
POTASSIUM: 3.8 mmol/L (ref 3.5–5.1)
Sodium: 138 mmol/L (ref 135–145)
Total Bilirubin: 2.1 mg/dL — ABNORMAL HIGH (ref 0.3–1.2)
Total Protein: 7.7 g/dL (ref 6.5–8.1)

## 2016-04-11 LAB — CBC WITH DIFFERENTIAL/PLATELET
BASOS PCT: 0 %
Basophils Absolute: 0 10*3/uL (ref 0.0–0.1)
Eosinophils Absolute: 0 10*3/uL (ref 0.0–0.7)
Eosinophils Relative: 0 %
HEMATOCRIT: 39.8 % (ref 39.0–52.0)
Hemoglobin: 13.7 g/dL (ref 13.0–17.0)
LYMPHS PCT: 6 %
Lymphs Abs: 1.3 10*3/uL (ref 0.7–4.0)
MCH: 30.7 pg (ref 26.0–34.0)
MCHC: 34.4 g/dL (ref 30.0–36.0)
MCV: 89.2 fL (ref 78.0–100.0)
Monocytes Absolute: 1.7 10*3/uL — ABNORMAL HIGH (ref 0.1–1.0)
Monocytes Relative: 7 %
NEUTROS ABS: 20 10*3/uL — AB (ref 1.7–7.7)
NEUTROS PCT: 87 %
PLATELETS: 299 10*3/uL (ref 150–400)
RBC: 4.46 MIL/uL (ref 4.22–5.81)
RDW: 13.3 % (ref 11.5–15.5)
WBC: 23 10*3/uL — AB (ref 4.0–10.5)

## 2016-04-11 LAB — RESPIRATORY PANEL BY PCR
ADENOVIRUS-RVPPCR: NOT DETECTED
BORDETELLA PERTUSSIS-RVPCR: NOT DETECTED
CHLAMYDOPHILA PNEUMONIAE-RVPPCR: NOT DETECTED
CORONAVIRUS NL63-RVPPCR: NOT DETECTED
Coronavirus 229E: NOT DETECTED
Coronavirus HKU1: NOT DETECTED
Coronavirus OC43: NOT DETECTED
INFLUENZA A-RVPPCR: NOT DETECTED
INFLUENZA B-RVPPCR: NOT DETECTED
Metapneumovirus: NOT DETECTED
Mycoplasma pneumoniae: NOT DETECTED
PARAINFLUENZA VIRUS 3-RVPPCR: NOT DETECTED
PARAINFLUENZA VIRUS 4-RVPPCR: NOT DETECTED
Parainfluenza Virus 1: NOT DETECTED
Parainfluenza Virus 2: NOT DETECTED
RESPIRATORY SYNCYTIAL VIRUS-RVPPCR: NOT DETECTED
RHINOVIRUS / ENTEROVIRUS - RVPPCR: NOT DETECTED

## 2016-04-11 LAB — I-STAT CG4 LACTIC ACID, ED: LACTIC ACID, VENOUS: 2.2 mmol/L — AB (ref 0.5–1.9)

## 2016-04-11 LAB — URINALYSIS, ROUTINE W REFLEX MICROSCOPIC
Bilirubin Urine: NEGATIVE
KETONES UR: 5 mg/dL — AB
Leukocytes, UA: NEGATIVE
NITRITE: NEGATIVE
PH: 5 (ref 5.0–8.0)
Protein, ur: >=300 mg/dL — AB
SPECIFIC GRAVITY, URINE: 1.027 (ref 1.005–1.030)

## 2016-04-11 LAB — BILIRUBIN, FRACTIONATED(TOT/DIR/INDIR)
Bilirubin, Direct: 0.5 mg/dL (ref 0.1–0.5)
Indirect Bilirubin: 1 mg/dL — ABNORMAL HIGH (ref 0.3–0.9)
Total Bilirubin: 1.5 mg/dL — ABNORMAL HIGH (ref 0.3–1.2)

## 2016-04-11 LAB — PROTIME-INR
INR: 1.3
Prothrombin Time: 16.2 seconds — ABNORMAL HIGH (ref 11.4–15.2)

## 2016-04-11 LAB — LACTIC ACID, PLASMA: Lactic Acid, Venous: 1.9 mmol/L (ref 0.5–1.9)

## 2016-04-11 LAB — INFLUENZA PANEL BY PCR (TYPE A & B)
INFLBPCR: NEGATIVE
Influenza A By PCR: NEGATIVE

## 2016-04-11 MED ORDER — ALBUTEROL SULFATE (2.5 MG/3ML) 0.083% IN NEBU
2.5000 mg | INHALATION_SOLUTION | RESPIRATORY_TRACT | Status: DC | PRN
  Administered 2016-04-12: 2.5 mg via RESPIRATORY_TRACT
  Filled 2016-04-11: qty 3

## 2016-04-11 MED ORDER — ACETAMINOPHEN 650 MG RE SUPP: 650.0000 mg | Freq: Four times a day (QID) | RECTAL | Status: DC | PRN

## 2016-04-11 MED ORDER — PREDNISONE 20 MG PO TABS
40.0000 mg | ORAL_TABLET | Freq: Every day | ORAL | Status: AC
  Administered 2016-04-11 – 2016-04-15 (×5): 40 mg via ORAL
  Filled 2016-04-11 (×5): qty 2

## 2016-04-11 MED ORDER — ENOXAPARIN SODIUM 40 MG/0.4ML ~~LOC~~ SOLN
40.0000 mg | SUBCUTANEOUS | Status: DC
  Administered 2016-04-11 – 2016-04-16 (×6): 40 mg via SUBCUTANEOUS
  Filled 2016-04-11 (×6): qty 0.4

## 2016-04-11 MED ORDER — IPRATROPIUM-ALBUTEROL 20-100 MCG/ACT IN AERS: 1.0000 | INHALATION_SPRAY | Freq: Four times a day (QID) | RESPIRATORY_TRACT | Status: DC

## 2016-04-11 MED ORDER — IPRATROPIUM-ALBUTEROL 0.5-2.5 (3) MG/3ML IN SOLN
3.0000 mL | Freq: Four times a day (QID) | RESPIRATORY_TRACT | Status: DC
  Administered 2016-04-11 (×3): 3 mL via RESPIRATORY_TRACT
  Filled 2016-04-11 (×3): qty 3

## 2016-04-11 MED ORDER — ADULT MULTIVITAMIN W/MINERALS CH
1.0000 | ORAL_TABLET | Freq: Every day | ORAL | Status: DC
  Administered 2016-04-11 – 2016-04-16 (×6): 1 via ORAL
  Filled 2016-04-11 (×7): qty 1

## 2016-04-11 MED ORDER — ALBUTEROL SULFATE (2.5 MG/3ML) 0.083% IN NEBU
5.0000 mg | INHALATION_SOLUTION | Freq: Once | RESPIRATORY_TRACT | Status: AC
  Administered 2016-04-11: 5 mg via RESPIRATORY_TRACT
  Filled 2016-04-11: qty 6

## 2016-04-11 MED ORDER — ALBUTEROL SULFATE HFA 108 (90 BASE) MCG/ACT IN AERS: 1.0000 | INHALATION_SPRAY | Freq: Four times a day (QID) | RESPIRATORY_TRACT | Status: DC | PRN

## 2016-04-11 MED ORDER — ONDANSETRON HCL 4 MG/2ML IJ SOLN: 4.0000 mg | Freq: Four times a day (QID) | INTRAMUSCULAR | Status: DC | PRN

## 2016-04-11 MED ORDER — PANTOPRAZOLE SODIUM 20 MG PO TBEC
20.0000 mg | DELAYED_RELEASE_TABLET | Freq: Two times a day (BID) | ORAL | Status: DC
  Administered 2016-04-11 – 2016-04-16 (×11): 20 mg via ORAL
  Filled 2016-04-11 (×12): qty 1

## 2016-04-11 MED ORDER — DEXTROSE 5 % IV SOLN
1.0000 g | Freq: Once | INTRAVENOUS | Status: AC
  Administered 2016-04-11: 1 g via INTRAVENOUS
  Filled 2016-04-11: qty 10

## 2016-04-11 MED ORDER — GABAPENTIN 300 MG PO CAPS
300.0000 mg | ORAL_CAPSULE | ORAL | Status: DC
  Administered 2016-04-11 – 2016-04-16 (×12): 300 mg via ORAL
  Filled 2016-04-11 (×11): qty 1

## 2016-04-11 MED ORDER — BACLOFEN 10 MG PO TABS
10.0000 mg | ORAL_TABLET | Freq: Every day | ORAL | Status: DC
  Administered 2016-04-11 – 2016-04-15 (×5): 10 mg via ORAL
  Filled 2016-04-11 (×5): qty 1

## 2016-04-11 MED ORDER — MESALAMINE 400 MG PO CPDR
1500.0000 mg | DELAYED_RELEASE_CAPSULE | Freq: Every day | ORAL | Status: DC
  Administered 2016-04-11 – 2016-04-16 (×6): 1600 mg via ORAL
  Filled 2016-04-11 (×6): qty 4

## 2016-04-11 MED ORDER — AZITHROMYCIN 250 MG PO TABS
500.0000 mg | ORAL_TABLET | ORAL | Status: DC
  Administered 2016-04-12 – 2016-04-16 (×5): 500 mg via ORAL
  Filled 2016-04-11 (×5): qty 2

## 2016-04-11 MED ORDER — DEXTROSE 5 % IV SOLN
1.0000 g | INTRAVENOUS | Status: DC
  Administered 2016-04-12 – 2016-04-16 (×5): 1 g via INTRAVENOUS
  Filled 2016-04-11 (×5): qty 10

## 2016-04-11 MED ORDER — METOPROLOL TARTRATE 12.5 MG HALF TABLET
12.5000 mg | ORAL_TABLET | Freq: Two times a day (BID) | ORAL | Status: DC
  Administered 2016-04-11 – 2016-04-16 (×11): 12.5 mg via ORAL
  Filled 2016-04-11 (×11): qty 1

## 2016-04-11 MED ORDER — HYDROCODONE-ACETAMINOPHEN 10-325 MG PO TABS
1.0000 | ORAL_TABLET | Freq: Two times a day (BID) | ORAL | Status: DC | PRN
  Administered 2016-04-11 – 2016-04-15 (×3): 1 via ORAL
  Filled 2016-04-11 (×3): qty 1

## 2016-04-11 MED ORDER — CALCIUM CARBONATE-VITAMIN D 500-200 MG-UNIT PO TABS
1.0000 | ORAL_TABLET | Freq: Every day | ORAL | Status: DC
  Administered 2016-04-11 – 2016-04-16 (×6): 1 via ORAL
  Filled 2016-04-11 (×6): qty 1

## 2016-04-11 MED ORDER — ONDANSETRON HCL 4 MG PO TABS: 4.0000 mg | ORAL_TABLET | Freq: Four times a day (QID) | ORAL | Status: DC | PRN

## 2016-04-11 MED ORDER — DEXTROSE 5 % IV SOLN
500.0000 mg | Freq: Once | INTRAVENOUS | Status: AC
  Administered 2016-04-11: 500 mg via INTRAVENOUS
  Filled 2016-04-11: qty 500

## 2016-04-11 MED ORDER — EPINEPHRINE 0.3 MG/0.3ML IJ SOAJ
0.3000 mg | INTRAMUSCULAR | Status: DC | PRN
  Filled 2016-04-11: qty 0.3

## 2016-04-11 MED ORDER — CLOPIDOGREL BISULFATE 75 MG PO TABS
75.0000 mg | ORAL_TABLET | Freq: Every day | ORAL | Status: DC
  Administered 2016-04-11 – 2016-04-16 (×6): 75 mg via ORAL
  Filled 2016-04-11 (×6): qty 1

## 2016-04-11 MED ORDER — METHYLPREDNISOLONE SODIUM SUCC 125 MG IJ SOLR
125.0000 mg | Freq: Once | INTRAMUSCULAR | Status: AC
  Administered 2016-04-11: 125 mg via INTRAVENOUS
  Filled 2016-04-11: qty 2

## 2016-04-11 MED ORDER — OSELTAMIVIR PHOSPHATE 30 MG PO CAPS
30.0000 mg | ORAL_CAPSULE | Freq: Two times a day (BID) | ORAL | Status: DC
  Administered 2016-04-11 – 2016-04-12 (×3): 30 mg via ORAL
  Filled 2016-04-11 (×5): qty 1

## 2016-04-11 MED ORDER — ENALAPRIL MALEATE 20 MG PO TABS
20.0000 mg | ORAL_TABLET | Freq: Every day | ORAL | Status: DC
  Administered 2016-04-11 – 2016-04-15 (×5): 20 mg via ORAL
  Filled 2016-04-11 (×5): qty 1

## 2016-04-11 MED ORDER — SODIUM CHLORIDE 0.9 % IV BOLUS (SEPSIS)
1000.0000 mL | Freq: Once | INTRAVENOUS | Status: AC
  Administered 2016-04-11: 1000 mL via INTRAVENOUS

## 2016-04-11 MED ORDER — IPRATROPIUM-ALBUTEROL 0.5-2.5 (3) MG/3ML IN SOLN
3.0000 mL | Freq: Once | RESPIRATORY_TRACT | Status: AC
  Administered 2016-04-11: 3 mL via RESPIRATORY_TRACT
  Filled 2016-04-11: qty 3

## 2016-04-11 MED ORDER — NITROGLYCERIN 0.4 MG SL SUBL: 0.4000 mg | SUBLINGUAL_TABLET | SUBLINGUAL | Status: DC | PRN

## 2016-04-11 MED ORDER — EPINEPHRINE 0.3 MG/0.3ML IJ SOAJ
0.3000 mg | Freq: Once | INTRAMUSCULAR | Status: DC
  Filled 2016-04-11: qty 0.3

## 2016-04-11 MED ORDER — GABAPENTIN 300 MG PO CAPS
600.0000 mg | ORAL_CAPSULE | Freq: Every day | ORAL | Status: DC
  Administered 2016-04-11 – 2016-04-15 (×5): 600 mg via ORAL
  Filled 2016-04-11 (×6): qty 2

## 2016-04-11 MED ORDER — IPRATROPIUM-ALBUTEROL 0.5-2.5 (3) MG/3ML IN SOLN
3.0000 mL | Freq: Four times a day (QID) | RESPIRATORY_TRACT | Status: DC
  Administered 2016-04-12 – 2016-04-16 (×18): 3 mL via RESPIRATORY_TRACT
  Filled 2016-04-11 (×18): qty 3

## 2016-04-11 MED ORDER — AMLODIPINE BESYLATE 5 MG PO TABS
5.0000 mg | ORAL_TABLET | Freq: Every day | ORAL | Status: DC
  Administered 2016-04-11 – 2016-04-16 (×6): 5 mg via ORAL
  Filled 2016-04-11 (×6): qty 1

## 2016-04-11 MED ORDER — SODIUM CHLORIDE 0.9 % IV SOLN
INTRAVENOUS | Status: DC
  Administered 2016-04-11 – 2016-04-14 (×6): via INTRAVENOUS

## 2016-04-11 MED ORDER — ASPIRIN EC 81 MG PO TBEC
81.0000 mg | DELAYED_RELEASE_TABLET | Freq: Every day | ORAL | Status: DC
  Administered 2016-04-11 – 2016-04-16 (×6): 81 mg via ORAL
  Filled 2016-04-11 (×6): qty 1

## 2016-04-11 MED ORDER — PRAVASTATIN SODIUM 40 MG PO TABS
40.0000 mg | ORAL_TABLET | Freq: Every day | ORAL | Status: DC
  Administered 2016-04-11 – 2016-04-15 (×5): 40 mg via ORAL
  Filled 2016-04-11 (×5): qty 1

## 2016-04-11 MED ORDER — ACETAMINOPHEN 325 MG PO TABS: 650.0000 mg | ORAL_TABLET | Freq: Four times a day (QID) | ORAL | Status: DC | PRN

## 2016-04-11 MED ORDER — ALBUTEROL SULFATE (2.5 MG/3ML) 0.083% IN NEBU: 2.5000 mg | INHALATION_SOLUTION | Freq: Four times a day (QID) | RESPIRATORY_TRACT | Status: DC

## 2016-04-11 NOTE — H&P (Addendum)
History and Physical  Patient Name: Wayne Meadows     FTD:[id number]    DOB: 03/31/1934    DOA: 04/11/2016 PCP: PROVIDER NOT IN SYSTEM   Patient coming from: Home  Chief Complaint: Fever, chills, cough, myalgias  HPI: Wayne Meadows is a 82 y.o. male with a past medical history significant for COPD not on home O2, CAD s/p PCI, hx CVA, renal Ca, CKD III baseline Cr 1.2 who presents with fever, myalgias, cough for 3 days.  He was in his usual state of health until Tuesday night when he developed cough and malaise. Then since Wednesday, he has had progressive fevers, chills, myalgias, congestion, sweats, cough, sputum production, and shortness of breath with wheezing. This didn't improve much with his home albuterol, and tonight had worsened so he came to the ER.  ED course: -Temp 100.7, heart rate 124, respirations 24, pulse ox 91% on room air, BP 156/83 -Na 138, K 3.8, Cr 1.4 (baseline 1.2), WBC 23K, Hgb 13.7 -Lactic acid 2.2 -INR 1.3 -CXR showed patchy left base opacity -Blood cultures were obtained and he was given IV fluids and ceftriaxone+azithromycin for CAP and Solu-medrol and nebs for COPD and TRH were asked to evaluate for admission     ROS: Review of Systems  Constitutional: Positive for chills, fever and malaise/fatigue.  HENT: Positive for congestion and sore throat.   Respiratory: Positive for cough, sputum production, shortness of breath and wheezing.   Gastrointestinal: Positive for nausea and vomiting.  Musculoskeletal: Positive for back pain and myalgias.  Neurological: Positive for dizziness and weakness.  All other systems reviewed and are negative.         Past Medical History:  Diagnosis Date  . Arthritis   . Carotid stenosis   . COPD (chronic obstructive pulmonary disease) ([place])   . Coronary artery disease    S/p PCI 2011;  NSTEMI 12/12:  LHC/PCI 02/23/11: LAD 60% after the septal perforator, D1 occluded with distal collaterals, proximal RI 30-40%,  AV circumflex stent patent with 60% stenosis after the stent, RCA 99%, EF 60-65%.  His RCA was treated with a bare-metal stent  . CVA (cerebral infarction) 2011   Right cerebral; total obstruction of the right ICA  . Diverticulitis   . Hypertension   . Rectal bleeding 07/2015  . Renal carcinoma ([place])   . Tobacco abuse, in remission     Past Surgical History:  Procedure Laterality Date  . COLON SURGERY    . COLONOSCOPY N/A 08/22/2015   Procedure: COLONOSCOPY;  Surgeon: [name], MD;  Location: [facility];  Service: Endoscopy;  Laterality: N/A;  . hip relacement    . KIDNEY SURGERY    . LEFT HEART CATHETERIZATION WITH CORONARY ANGIOGRAM N/A 02/23/2011   Procedure: LEFT HEART CATHETERIZATION WITH CORONARY ANGIOGRAM;  Surgeon: [name], MD;  Location: [facility] CATH LAB;  Service: Cardiovascular;  Laterality: N/A;  . LEFT HEART CATHETERIZATION WITH CORONARY ANGIOGRAM N/A 03/18/2011   Procedure: LEFT HEART CATHETERIZATION WITH CORONARY ANGIOGRAM;  Surgeon: [name], MD;  Location: [facility] CATH LAB;  Service: Cardiovascular;  Laterality: N/A;  . PERCUTANEOUS CORONARY STENT INTERVENTION (PCI-S) N/A 02/23/2011   Procedure: PERCUTANEOUS CORONARY STENT INTERVENTION (PCI-S);  Surgeon: [name], MD;  Location: [facility] CATH LAB;  Service: Cardiovascular;  Laterality: N/A;  . TEMPORARY PACEMAKER INSERTION N/A 02/23/2011   Procedure: TEMPORARY PACEMAKER INSERTION;  Surgeon: [name], MD;  Location: [facility] CATH LAB;  Service: Cardiovascular;  Laterality: N/A;  Social History: Patient lives alone.  The patient walks unassisted.  He is a retired Dealer.  Grew up in [place].  Former smoker.  Minimal alcohol use.    Allergies  Allergen Reactions  . Bee Venom Anaphylaxis    Has epi pen  . Influenza Vaccines Other (See Comments)    "Mortally sick for 2 weeks"    Family history: History reviewed.  No lung disease.  Prior to Admission medications   Medication Sig Start Date End Date  Taking? Authorizing Provider  albuterol (PROVENTIL HFA;VENTOLIN HFA) 108 (90 Base) MCG/ACT inhaler Inhale 1-2 puffs into the lungs every 6 (six) hours as needed for wheezing or shortness of breath.    Historical Provider, MD  amLODipine (NORVASC) 5 MG tablet Take 1 tablet (5 mg total) by mouth daily. Patient not taking: Reported on 11/14/2015 08/22/15   Lavina Hamman, MD  amLODipine (NORVASC) 5 MG tablet Take 5 mg by mouth daily.    Historical Provider, MD  ARTIFICIAL TEAR OP Place 1 drop into both eyes 2 (two) times daily as needed (dry eyes).     Historical Provider, MD  aspirin EC 81 MG tablet Take 81 mg by mouth daily.      Historical Provider, MD  baclofen (LIORESAL) 10 MG tablet Take 10 mg by mouth at bedtime as needed for muscle spasms.    Historical Provider, MD  calcium-vitamin D (OSCAL WITH D) 500-200 MG-UNIT tablet Take 1 tablet by mouth daily with breakfast.    Historical Provider, MD  clopidogrel (PLAVIX) 75 MG tablet Take 75 mg by mouth daily.    Historical Provider, MD  cyanocobalamin 500 MCG tablet Take 500 mcg by mouth daily.    Historical Provider, MD  enalapril (VASOTEC) 20 MG tablet Take 20 mg by mouth at bedtime.     Historical Provider, MD  EPINEPHrine (EPI-PEN) 0.3 mg/0.3 mL DEVI Inject 0.3 mg into the muscle once.    Historical Provider, MD  gabapentin (NEURONTIN) 300 MG capsule Take 300-600 mg by mouth 3 (three) times daily. Takes one capsule in the morning and one at noon, then takes two capsules at bedtime.    Historical Provider, MD  HYDROcodone-acetaminophen (NORCO) 10-325 MG tablet Take 1 tablet by mouth 2 (two) times daily as needed for moderate pain.     Historical Provider, MD  hydrocortisone (ANUSOL-HC) 25 MG suppository Place 1 suppository (25 mg total) rectally 2 (two) times daily. Patient not taking: Reported on 09/04/2015 08/22/15   Lavina Hamman, MD  Ipratropium-Albuterol (COMBIVENT) 20-100 MCG/ACT AERS respimat Inhale 1 puff into the lungs 4 (four) times daily.     Historical Provider, MD  MESALAMINE PO Take 1,500 mg by mouth daily.    Historical Provider, MD  metoprolol tartrate (LOPRESSOR) 25 MG tablet Take 12.5 mg by mouth 2 (two) times daily.    Historical Provider, MD  mometasone [facility]) 220 MCG/INH inhaler Inhale 1 puff into the lungs at bedtime.      Historical Provider, MD  Multiple Vitamin (MULTIVITAMIN) tablet Take 1 tablet by mouth daily.    Historical Provider, MD  nitroGLYCERIN (NITROSTAT) 0.4 MG SL tablet Place 0.4 mg under the tongue every 5 (five) minutes as needed for chest pain.    Historical Provider, MD  Omega-3 Fatty Acids (FISH OIL) 1200 MG CAPS Take 1 capsule by mouth daily.    Historical Provider, MD  pantoprazole (PROTONIX) 20 MG tablet Take 1 tablet (20 mg total) by mouth 2 (two) times daily before a meal.  08/09/15   Orpah Greek, MD  polyethylene glycol (MIRALAX / GLYCOLAX) packet Take 17 g by mouth daily. Patient taking differently: Take 17 g by mouth daily as needed.  08/22/15   Lavina Hamman, MD  pravastatin (PRAVACHOL) 40 MG tablet Take 40 mg by mouth at bedtime.    Historical Provider, MD  predniSONE (DELTASONE) 20 MG tablet 2 tabs po daily x 4 days 11/15/15   Deno Etienne, DO       Physical Exam: BP 170/70   Pulse 107   Temp 100.1 F (37.8 C) (Oral)   Resp 16   Ht 6\' 2"  (1.88 m)   Wt 103.4 kg (228 lb)   SpO2 98%   BMI 29.27 kg/m  General appearance: Well-developed, elderly adult male, alert and in no acute distress, appears to have moderate malaise.   Eyes: Anicteric, conjunctiva pink, lids and lashes normal. PERRL.    ENT: No nasal deformity, discharge, epistaxis.  Hearing normal. OP tacky dry without lesions.   Neck: No neck masses.  Trachea midline.  No thyromegaly/tenderness. Lymph: No cervical or supraclavicular lymphadenopathy. Skin: Warm and dry.  No jaundice.  No suspicious rashes or lesions. Cardiac: Tachycardic, regular, nl S1-S2, no murmurs appreciated.  Capillary refill is brisk.  JVP  normal.  No LE edema.  Radial and DP pulses 2+ and symmetric. Respiratory: Normal respiratory rate and rhythm.  Breath sounds wheezy throughout, no focal rales. Abdomen: Abdomen soft.  No TTP. No ascites, distension, hepatosplenomegaly.   MSK: No deformities or effusions.  No cyanosis or clubbing. Neuro: Cranial nerves normal.  Sensation intact to light touchn. Speech is fluent.  Muscle strength normal.    Psych: Sensorium intact and responding to questions, attention normal.  Behavior appropriate.  Affect normal.  Judgment and insight appear normal.     Labs on Admission:  I have personally reviewed following labs and imaging studies: CBC:  Recent Labs Lab 04/11/16 0320  WBC 23.0*  NEUTROABS 20.0*  HGB 13.7  HCT 39.8  MCV 89.2  PLT 182   Basic Metabolic Panel:  Recent Labs Lab 04/11/16 0320  NA 138  K 3.8  CL 107  CO2 22  GLUCOSE 145*  BUN 15  CREATININE 1.39*  CALCIUM 9.1   GFR: Estimated Creatinine Clearance: 55.3 mL/min (by C-G formula based on SCr of 1.39 mg/dL (H)).  Liver Function Tests:  Recent Labs Lab 04/11/16 0320  AST 27  ALT 21  ALKPHOS 57  BILITOT 2.1*  PROT 7.7  ALBUMIN 3.8   No results for input(s): LIPASE, AMYLASE in the last 168 hours. No results for input(s): AMMONIA in the last 168 hours. Coagulation Profile:  Recent Labs Lab 04/11/16 0320  INR 1.30   Cardiac Enzymes: No results for input(s): CKTOTAL, CKMB, CKMBINDEX, TROPONINI in the last 168 hours. BNP (last 3 results) No results for input(s): PROBNP in the last 8760 hours. HbA1C: No results for input(s): HGBA1C in the last 72 hours. CBG: No results for input(s): GLUCAP in the last 168 hours. Lipid Profile: No results for input(s): CHOL, HDL, LDLCALC, TRIG, CHOLHDL, LDLDIRECT in the last 72 hours. Thyroid Function Tests: No results for input(s): TSH, T4TOTAL, FREET4, T3FREE, THYROIDAB in the last 72 hours. Anemia Panel: No results for input(s): VITAMINB12, FOLATE,  FERRITIN, TIBC, IRON, RETICCTPCT in the last 72 hours. Sepsis Labs: Lactic acid 2.2 Invalid input(s): PROCALCITONIN, LACTICIDVEN No results found for this or any previous visit (from the past 240 hour(s)).  Radiological Exams on Admission: Personally reviewed CXR shows emphysematous changes, ?L base opacity: Dg Chest 2 View  Result Date: 04/11/2016 CLINICAL DATA:  Initial evaluation for acute shortness of breath. EXAM: CHEST  2 VIEW COMPARISON:  Prior radiograph from 11/14/2015. FINDINGS: Cardiac and mediastinal silhouettes are within normal limits. Aortic atherosclerosis. Lungs are hyperinflated with underlying emphysematous changes. Fever. Hazy opacity within the left lower lobe, concerning for pneumonia. Right lung clear. No pulmonary edema or pleural effusion. No pneumothorax. No acute osseous abnormality. IMPRESSION: 1. Patchy and hazy left basilar opacity, concerning for pneumonia. 2. Emphysema. 3. Aortic atherosclerosis. Electronically Signed   By: Jeannine Boga M.D.   On: 04/11/2016 03:54    EKG: Independently reviewed. Rate 120, QTc 414, no ST changes.       Assessment/Plan  1. Sepsis from community-acquired pneumonia:  Suspected source pneumonia. Organism unknown.   Patient meets criteria given tachycardia, tachypnea, fever, leukocytosis, and evidence of organ dysfunction.  Lactate 2.2 mmol/L and repeat ordered within 6 hours.  This patient is not at high risk of poor outcomes with a qSOFA score of 1 (at least 2 of the following clinical criteria: respiratory rate of 22/min or greater, altered mentation, or systolic blood pressure of 100 mm Hg or less).  Antibiotics delivered in the ED.    -Sepsis bundle utilized:  -Blood cultures drawn; sputum culture ordered  -Antibiotics: ceftriaxone and azithromycin and Tamiflu  -Check flu swab  -Check urine strep antigen  -Repeat renal function and complete blood count in AM  -Code SEPSIS called to E-link    2. COPD  exacerbation:  -Prednisone 40 mg daily for 5 days -Albuterol scheduled and PRN -Continue Combivent -Hold Asmanex while on prednisone  3. Hypertension, CAD and CVA secondary prevention:  -Continue metoprolol, enalapril -Continue statin, asiprin, Plavix  4. Elevated total bilirubin:  -Check fractionated bili  5. Other medications:  -Continue PPI -Continue Gabapentin -Continue mesalamine            DVT prophylaxis: Lovenox  Code Status: FULL  Family Communication: Daughter at bedside  Disposition Plan: Anticipate IV antibiotics and fluids for sepsis, Tamiflu at least until PCR returns, steroids and BDs for COPD flare. Consults called: None Admission status: INPATIENT         Medical decision making: Patient seen at 5:26 AM on 04/11/2016.  The patient was discussed with Dr. Roxanne Mins.  What exists of the patient's chart was reviewed in depth and summarized above.  Clinical condition: stable from cardiorespiratory standpoint for floor.        Edwin Dada [facility] Pager [phone]      At the time of admission, it appears that the appropriate admission status for this patient is INPATIENT. This is judged to be reasonable and necessary in order to provide the required intensity of service to ensure the patient's safety given the presenting symptoms, physical exam findings, and initial radiographic and laboratory data in the context of their chronic comorbidities.  Together, these circumstances are felt to place him at high risk for further clinical deterioration threatening life, limb, or organ.   Patient requires inpatient status due to high intensity of service, high risk for further deterioration and high frequency of surveillance required because of this severe exacerbation of their chronic organ failure and acute illness that poses a threat to life, limb or bodily function.  Factors that support inpatient status include: Presentation in  respiratory distress with breathing 24 times per minute, heart rate 124, and wheezing from COPD  exacerbation, with evidence of sepsis physiology from pneumonia.  I certify that at the point of admission it is my clinical judgment that the patient will require inpatient hospital care spanning beyond 2 midnights from the point of admission and that early discharge would result in unnecessary risk of decompensation and readmission or threat to life, limb or bodily function.

## 2016-04-11 NOTE — ED Notes (Signed)
Patient transported to X-ray 

## 2016-04-11 NOTE — Progress Notes (Signed)
Patient was admitted overnight after midnight and H and P has been reviewed and I am in agreement with the current Assessment and Plan done by Dr. Loleta Books. Patient is a 82 year old Caucasian Male with a PMH of COPD (not on Home O2), CAD s/p PCI, Renal Cancer, CKD Stage 3, Hx of CVA and other comorbids who prsented to [facility] with a cc of Cough, Malasie, fever, chills and sweats. Symptoms started Tuesday night and progressively got worse and came in and was diagnosed with Sepsis from a Left sided CAP. We will continue Treatment with IV Abx and continue Prednisone and obtain Respiratory Viral Panel. Influenza PCR was negative so Tamiflu was discontinued. Blood Cx are still pending. Lactic Levels are trending down. Patient is on daily Mesalamine for chronic colitis and will continue. Will repeat CXR in AM and monitor Clinical Response to Abx and repeat CBC and CMP in Am.

## 2016-04-11 NOTE — ED Provider Notes (Signed)
[facility] DEPT Provider Note   CSN: [id number] Arrival date & time: 04/11/16  0250  By signing my name below, I, Dora Sims, attest that this documentation has been prepared under the direction and in the presence of physician practitioner, Delora Fuel, MD. Electronically Signed: Dora Sims, Scribe. 04/11/2016. 3:30 AM.  History   Chief Complaint Chief Complaint  Patient presents with  . Fever  . Shortness of Breath    The history is provided by the patient. No language interpreter was used.     HPI Comments: MADS BORGMEYER is a 82 y.o. male with PMHx including COPD, CAD, HTN, HLD, and A-Fib who presents to the Emergency Department complaining of a persistent fever beginning two days ago. He reports associated chills, nasal congestion, a productive cough with green sputum, SOB, and occasional diaphoresis. He has albuterol inhalers at home and has used them with some improvement of his SOB. Pt states he has not used any medications for his fever because he does not want them to interfere with his blood pressure medications. Pt notes he has been admitted to the hospital in the past for shortness of breath. He did not receive a flu vaccination this season. Pt notes some body aches, most significantly in his back, at baseline but denies acute changes in this condition. No known sick contacts. He denies nausea, vomiting, or any other associated symptoms.   Past Medical History:  Diagnosis Date  . Arthritis   . Carotid stenosis   . COPD (chronic obstructive pulmonary disease) ([place])   . Coronary artery disease    S/p PCI 2011;  NSTEMI 12/12:  LHC/PCI 02/23/11: LAD 60% after the septal perforator, D1 occluded with distal collaterals, proximal RI 30-40%, AV circumflex stent patent with 60% stenosis after the stent, RCA 99%, EF 60-65%.  His RCA was treated with a bare-metal stent  . CVA (cerebral infarction) 2011   Right cerebral; total obstruction of the right ICA  .  Diverticulitis   . Hypertension   . Rectal bleeding 07/2015  . Renal carcinoma ([place])   . Tobacco abuse, in remission     Patient Active Problem List   Diagnosis Date Noted  . Segmental colitis ([place]) 09/04/2015  . Generalized abdominal pain 09/04/2015  . Noninfectious gastroenteritis, unspecified   . Benign neoplasm of transverse colon   . Benign neoplasm of colon   . Bright red blood per rectum   . Acute blood loss anemia   . Dysphagia   . CAD in native artery   . Renal cell cancer ([place])   . Essential hypertension   . Chronic obstructive pulmonary disease ([place])   . Blood in stool 08/19/2015  . Rectal bleeding 08/19/2015  . Chest pain 04/08/2014  . CAP (community acquired pneumonia) 04/08/2014  . Pneumonia involving right lung 04/08/2014  . Overweight (BMI 25.0-29.9) 04/08/2014  . Healthcare-associated pneumonia 10/26/2011  . Cholelithiasis 04/26/2011  . Atrial fibrillation ([place]) 04/26/2011  . Chest pain 03/19/2011  . Hypertension 03/19/2011  . Unstable angina ([place]) 03/18/2011    Class: Acute  . Lung nodule 03/02/2011  . Liver lesion 03/02/2011  . HLD (hyperlipidemia) 03/02/2011  . Abnormal abdominal CT scan 02/23/2011  . Non Q wave myocardial infarction ([place]) 02/22/2011    Class: Acute  . Cerebrovascular disease 02/22/2011  . Coronary atherosclerosis of native coronary artery 02/22/2011  . COPD (chronic obstructive pulmonary disease) ([place]) 02/22/2011  . Tobacco abuse, 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/22/2011    Past Surgical History:  Procedure Laterality Date  .  COLON SURGERY    . COLONOSCOPY N/A 08/22/2015   Procedure: COLONOSCOPY;  Surgeon: [name], MD;  Location: [facility] ENDOSCOPY;  Service: Endoscopy;  Laterality: N/A;  . hip relacement    . KIDNEY SURGERY    . LEFT HEART CATHETERIZATION WITH CORONARY ANGIOGRAM N/A 02/23/2011   Procedure: LEFT HEART CATHETERIZATION WITH CORONARY ANGIOGRAM;  Surgeon: [name], MD;  Location: [facility] CATH LAB;  Service: Cardiovascular;   Laterality: N/A;  . LEFT HEART CATHETERIZATION WITH CORONARY ANGIOGRAM N/A 03/18/2011   Procedure: LEFT HEART CATHETERIZATION WITH CORONARY ANGIOGRAM;  Surgeon: [name], MD;  Location: [facility] CATH LAB;  Service: Cardiovascular;  Laterality: N/A;  . PERCUTANEOUS CORONARY STENT INTERVENTION (PCI-S) N/A 02/23/2011   Procedure: PERCUTANEOUS CORONARY STENT INTERVENTION (PCI-S);  Surgeon: [name], MD;  Location: [facility] CATH LAB;  Service: Cardiovascular;  Laterality: N/A;  . TEMPORARY PACEMAKER INSERTION N/A 02/23/2011   Procedure: TEMPORARY PACEMAKER INSERTION;  Surgeon: [name], MD;  Location: [facility] CATH LAB;  Service: Cardiovascular;  Laterality: N/A;       Home Medications    Prior to Admission medications   Medication Sig Start Date End Date Taking? Authorizing Provider  albuterol (PROVENTIL HFA;VENTOLIN HFA) 108 (90 Base) MCG/ACT inhaler Inhale 1-2 puffs into the lungs every 6 (six) hours as needed for wheezing or shortness of breath.    Historical Provider, MD  amLODipine (NORVASC) 5 MG tablet Take 1 tablet (5 mg total) by mouth daily. Patient not taking: Reported on 11/14/2015 08/22/15   Lavina Hamman, MD  amLODipine (NORVASC) 5 MG tablet Take 5 mg by mouth daily.    Historical Provider, MD  ARTIFICIAL TEAR OP Place 1 drop into both eyes 2 (two) times daily as needed (dry eyes).     Historical Provider, MD  aspirin EC 81 MG tablet Take 81 mg by mouth daily.      Historical Provider, MD  baclofen (LIORESAL) 10 MG tablet Take 10 mg by mouth at bedtime as needed for muscle spasms.    Historical Provider, MD  calcium-vitamin D (OSCAL WITH D) 500-200 MG-UNIT tablet Take 1 tablet by mouth daily with breakfast.    Historical Provider, MD  clopidogrel (PLAVIX) 75 MG tablet Take 75 mg by mouth daily.    Historical Provider, MD  cyanocobalamin 500 MCG tablet Take 500 mcg by mouth daily.    Historical Provider, MD  enalapril (VASOTEC) 20 MG tablet Take 20 mg by mouth at bedtime.      Historical Provider, MD  EPINEPHrine (EPI-PEN) 0.3 mg/0.3 mL DEVI Inject 0.3 mg into the muscle once.    Historical Provider, MD  gabapentin (NEURONTIN) 300 MG capsule Take 300-600 mg by mouth 3 (three) times daily. Takes one capsule in the morning and one at noon, then takes two capsules at bedtime.    Historical Provider, MD  HYDROcodone-acetaminophen (NORCO) 10-325 MG tablet Take 1 tablet by mouth 2 (two) times daily as needed for moderate pain.     Historical Provider, MD  hydrocortisone (ANUSOL-HC) 25 MG suppository Place 1 suppository (25 mg total) rectally 2 (two) times daily. Patient not taking: Reported on 09/04/2015 08/22/15   Lavina Hamman, MD  Ipratropium-Albuterol (COMBIVENT) 20-100 MCG/ACT AERS respimat Inhale 1 puff into the lungs 4 (four) times daily.    Historical Provider, MD  MESALAMINE PO Take 1,500 mg by mouth daily.    Historical Provider, MD  metoprolol tartrate (LOPRESSOR) 25 MG tablet Take 12.5 mg by mouth 2 (two) times daily.  Historical Provider, MD  mometasone [facility]) 220 MCG/INH inhaler Inhale 1 puff into the lungs at bedtime.      Historical Provider, MD  Multiple Vitamin (MULTIVITAMIN) tablet Take 1 tablet by mouth daily.    Historical Provider, MD  nitroGLYCERIN (NITROSTAT) 0.4 MG SL tablet Place 0.4 mg under the tongue every 5 (five) minutes as needed for chest pain.    Historical Provider, MD  Omega-3 Fatty Acids (FISH OIL) 1200 MG CAPS Take 1 capsule by mouth daily.    Historical Provider, MD  pantoprazole (PROTONIX) 20 MG tablet Take 1 tablet (20 mg total) by mouth 2 (two) times daily before a meal. 08/09/15   Orpah Greek, MD  polyethylene glycol (MIRALAX / GLYCOLAX) packet Take 17 g by mouth daily. Patient taking differently: Take 17 g by mouth daily as needed.  08/22/15   Lavina Hamman, MD  pravastatin (PRAVACHOL) 40 MG tablet Take 40 mg by mouth at bedtime.    Historical Provider, MD  predniSONE (DELTASONE) 20 MG tablet 2 tabs po daily x 4 days  11/15/15   Deno Etienne, DO    Family History Family History  Problem Relation Age of Onset  . Heart attack  88    Social History Social History  Substance Use Topics  . Smoking status: Former Smoker    Types: Cigarettes    Quit date: 02/24/2007  . Smokeless tobacco: Never Used  . Alcohol use 0.6 oz/week    1 Standard drinks or equivalent per week     Comment: socially      Allergies   Bee venom and Influenza vaccines   Review of Systems Review of Systems  Constitutional: Positive for chills, diaphoresis and fever.  HENT: Positive for congestion.   Respiratory: Positive for cough and shortness of breath.   Gastrointestinal: Negative for nausea and vomiting.  Musculoskeletal: Positive for back pain (baseline) and myalgias (baseline).  All other systems reviewed and are negative.    Physical Exam Updated Vital Signs BP 156/83   Pulse (!) 124   Temp 100.7 F (38.2 C) (Oral)   Resp 24   Ht 6\' 2"  (1.88 m)   Wt 228 lb (103.4 kg)   SpO2 91%   BMI 29.27 kg/m   Physical Exam  Constitutional: He is oriented to person, place, and time. He appears well-developed and well-nourished.  HENT:  Head: Normocephalic and atraumatic.  Eyes: EOM are normal. Pupils are equal, round, and reactive to light.  Neck: Normal range of motion. Neck supple. No JVD present.  Cardiovascular: Normal rate, regular rhythm and normal heart sounds.   No murmur heard. Pulmonary/Chest: Effort normal. He has wheezes. He has no rales. He exhibits no tenderness.  Diffuse expiratory wheezes. Diminished air flow diffusely.   Abdominal: Soft. Bowel sounds are normal. He exhibits no distension and no mass. There is no tenderness. A hernia is present.  Small incisional hernia which is easily reducible.  Musculoskeletal: Normal range of motion. He exhibits edema.  Trace pretibial edema.  Lymphadenopathy:    He has no cervical adenopathy.  Neurological: He is alert and oriented to person, place, and time.  No cranial nerve deficit. He exhibits normal muscle tone. Coordination normal.  Skin: Skin is warm and dry. No rash noted.  Psychiatric: He has a normal mood and affect. His behavior is normal. Judgment and thought content normal.  Nursing note and vitals reviewed.    ED Treatments / Results  Labs (all labs ordered are listed, but only  abnormal results are displayed) Labs Reviewed  CULTURE, BLOOD (ROUTINE X 2)  CULTURE, BLOOD (ROUTINE X 2)  COMPREHENSIVE METABOLIC PANEL  CBC WITH DIFFERENTIAL/PLATELET  PROTIME-INR  URINALYSIS, ROUTINE W REFLEX MICROSCOPIC  I-STAT CG4 LACTIC ACID, ED    EKG  EKG Interpretation  Date/Time:  Saturday April 11 2016 02:59:48 EST Ventricular Rate:  120 PR Interval:    QRS Duration: 98 QT Interval:  293 QTC Calculation: 414 R Axis:   90 Text Interpretation:  Sinus tachycardia Probable left atrial enlargement Borderline right axis deviation Anteroseptal infarct, old Nonspecific repol abnormality, diffuse leads When compared with ECG of 11/14/2015, No significant change was found Confirmed by [facility]  MD, Donetta Isaza ([id number]) on 04/11/2016 3:05:13 AM       Radiology Dg Chest 2 View  Result Date: 04/11/2016 CLINICAL DATA:  Initial evaluation for acute shortness of breath. EXAM: CHEST  2 VIEW COMPARISON:  Prior radiograph from 11/14/2015. FINDINGS: Cardiac and mediastinal silhouettes are within normal limits. Aortic atherosclerosis. Lungs are hyperinflated with underlying emphysematous changes. Fever. Hazy opacity within the left lower lobe, concerning for pneumonia. Right lung clear. No pulmonary edema or pleural effusion. No pneumothorax. No acute osseous abnormality. IMPRESSION: 1. Patchy and hazy left basilar opacity, concerning for pneumonia. 2. Emphysema. 3. Aortic atherosclerosis. Electronically Signed   By: Jeannine Boga M.D.   On: 04/11/2016 03:54    Procedures Procedures (including critical care time)  DIAGNOSTIC STUDIES: Oxygen Saturation  is 91% on [place], low by my interpretation.    COORDINATION OF CARE: 3:35 AM Discussed treatment plan with pt at bedside and pt agreed to plan.  Medications Ordered in ED Medications  albuterol (PROVENTIL) (2.5 MG/3ML) 0.083% nebulizer solution 5 mg (5 mg Nebulization Given 04/11/16 0318)     Initial Impression / Assessment and Plan / ED Course  I have reviewed the triage vital signs and the nursing notes.  Pertinent labs & imaging results that were available during my care of the patient were reviewed by me and considered in my medical decision making (see chart for details).  Cough with probably represent COPD exacerbation. Significant wheezing is noted. He started on albuterol with ipratropium by nebulizer. Chest x-rays obtained which shows area of probable immunity acquired pneumonia in the left base. Lactic acid level is come back mildly elevated at 2.2. Vital signs do not show evidence of severe sepsis. He has mild tachycardia but normal blood pressure. He is given 1 L fluid, IV ceftriaxone, IV azithromycin, and IV methylprednisolone. He is given additional albuterol with ipratropium. Case is discussed with Dr. Loleta Books of [facility] hospital[facility] who agrees to admit the patient.  Final Clinical Impressions(s) / ED Diagnoses   Final diagnoses:  Community acquired pneumonia of left lower lobe of lung (HCC)  COPD exacerbation (HCC)  Renal insufficiency  Elevated lactic acid level    New Prescriptions New Prescriptions   No medications on file   I personally performed the services described in this documentation, which was scribed in my presence. The recorded information has been reviewed and is accurate.      Delora Fuel, MD [id number]/45/[id number]

## 2016-04-11 NOTE — ED Notes (Signed)
Patient denies pain and is resting comfortably.  

## 2016-04-11 NOTE — ED Notes (Signed)
Hospitalist at bedside 

## 2016-04-11 NOTE — ED Triage Notes (Signed)
Pt reports that for the last 2 days having fever and productive cough. Pt denies taking any medications for fever due to blood pressure medications.

## 2016-04-12 ENCOUNTER — Inpatient Hospital Stay (HOSPITAL_COMMUNITY): Payer: Medicare Other

## 2016-04-12 LAB — CBC
HCT: 35 % — ABNORMAL LOW (ref 39.0–52.0)
Hemoglobin: 12.2 g/dL — ABNORMAL LOW (ref 13.0–17.0)
MCH: 30.8 pg (ref 26.0–34.0)
MCHC: 34.9 g/dL (ref 30.0–36.0)
MCV: 88.4 fL (ref 78.0–100.0)
Platelets: 253 10*3/uL (ref 150–400)
RBC: 3.96 MIL/uL — ABNORMAL LOW (ref 4.22–5.81)
RDW: 13.4 % (ref 11.5–15.5)
WBC: 22.2 10*3/uL — ABNORMAL HIGH (ref 4.0–10.5)

## 2016-04-12 LAB — STREP PNEUMONIAE URINARY ANTIGEN: Strep Pneumo Urinary Antigen: NEGATIVE

## 2016-04-12 LAB — BLOOD CULTURE ID PANEL (REFLEXED)
Acinetobacter baumannii: NOT DETECTED
CANDIDA ALBICANS: NOT DETECTED
CANDIDA GLABRATA: NOT DETECTED
CANDIDA KRUSEI: NOT DETECTED
Candida parapsilosis: NOT DETECTED
Candida tropicalis: NOT DETECTED
ENTEROBACTER CLOACAE COMPLEX: NOT DETECTED
ENTEROBACTERIACEAE SPECIES: NOT DETECTED
ENTEROCOCCUS SPECIES: NOT DETECTED
ESCHERICHIA COLI: NOT DETECTED
Haemophilus influenzae: NOT DETECTED
KLEBSIELLA PNEUMONIAE: NOT DETECTED
Klebsiella oxytoca: NOT DETECTED
LISTERIA MONOCYTOGENES: NOT DETECTED
Methicillin resistance: DETECTED — AB
NEISSERIA MENINGITIDIS: NOT DETECTED
PROTEUS SPECIES: NOT DETECTED
Pseudomonas aeruginosa: NOT DETECTED
STAPHYLOCOCCUS SPECIES: DETECTED — AB
STREPTOCOCCUS AGALACTIAE: NOT DETECTED
STREPTOCOCCUS PYOGENES: NOT DETECTED
STREPTOCOCCUS SPECIES: NOT DETECTED
Serratia marcescens: NOT DETECTED
Staphylococcus aureus (BCID): NOT DETECTED
Streptococcus pneumoniae: NOT DETECTED

## 2016-04-12 LAB — COMPREHENSIVE METABOLIC PANEL
ALBUMIN: 3 g/dL — AB (ref 3.5–5.0)
ALT: 17 U/L (ref 17–63)
ANION GAP: 6 (ref 5–15)
AST: 20 U/L (ref 15–41)
Alkaline Phosphatase: 48 U/L (ref 38–126)
BUN: 24 mg/dL — AB (ref 6–20)
CHLORIDE: 110 mmol/L (ref 101–111)
CO2: 22 mmol/L (ref 22–32)
Calcium: 8.7 mg/dL — ABNORMAL LOW (ref 8.9–10.3)
Creatinine, Ser: 1.4 mg/dL — ABNORMAL HIGH (ref 0.61–1.24)
GFR calc Af Amer: 54 mL/min — ABNORMAL LOW (ref >60)
GFR calc non Af Amer: 46 mL/min — ABNORMAL LOW (ref >60)
GLUCOSE: 224 mg/dL — AB (ref 65–99)
POTASSIUM: 3.7 mmol/L (ref 3.5–5.1)
SODIUM: 138 mmol/L (ref 135–145)
Total Bilirubin: 0.8 mg/dL (ref 0.3–1.2)
Total Protein: 6.4 g/dL — ABNORMAL LOW (ref 6.5–8.1)

## 2016-04-12 LAB — MAGNESIUM: Magnesium: 1.9 mg/dL (ref 1.7–2.4)

## 2016-04-12 LAB — PHOSPHORUS: Phosphorus: 1.7 mg/dL — ABNORMAL LOW (ref 2.5–4.6)

## 2016-04-12 MED ORDER — VANCOMYCIN HCL 10 G IV SOLR
1250.0000 mg | INTRAVENOUS | Status: DC
  Administered 2016-04-13: 1250 mg via INTRAVENOUS
  Filled 2016-04-12 (×2): qty 1250

## 2016-04-12 MED ORDER — VANCOMYCIN HCL 10 G IV SOLR
1500.0000 mg | Freq: Once | INTRAVENOUS | Status: AC
  Administered 2016-04-12: 1500 mg via INTRAVENOUS
  Filled 2016-04-12: qty 1500

## 2016-04-12 NOTE — Progress Notes (Signed)
PHARMACY - PHYSICIAN COMMUNICATION CRITICAL VALUE ALERT - BLOOD CULTURE IDENTIFICATION (BCID)  Results for orders placed or performed during the hospital encounter of 04/11/16  Blood Culture ID Panel (Reflexed) (Collected: 04/11/2016  3:20 AM)  Result Value Ref Range   Enterococcus species NOT DETECTED NOT DETECTED   Listeria monocytogenes NOT DETECTED NOT DETECTED   Staphylococcus species DETECTED (A) NOT DETECTED   Staphylococcus aureus NOT DETECTED NOT DETECTED   Methicillin resistance DETECTED (A) NOT DETECTED   Streptococcus species NOT DETECTED NOT DETECTED   Streptococcus agalactiae NOT DETECTED NOT DETECTED   Streptococcus pneumoniae NOT DETECTED NOT DETECTED   Streptococcus pyogenes NOT DETECTED NOT DETECTED   Acinetobacter baumannii NOT DETECTED NOT DETECTED   Enterobacteriaceae species NOT DETECTED NOT DETECTED   Enterobacter cloacae complex NOT DETECTED NOT DETECTED   Escherichia coli NOT DETECTED NOT DETECTED   Klebsiella oxytoca NOT DETECTED NOT DETECTED   Klebsiella pneumoniae NOT DETECTED NOT DETECTED   Proteus species NOT DETECTED NOT DETECTED   Serratia marcescens NOT DETECTED NOT DETECTED   Haemophilus influenzae NOT DETECTED NOT DETECTED   Neisseria meningitidis NOT DETECTED NOT DETECTED   Pseudomonas aeruginosa NOT DETECTED NOT DETECTED   Candida albicans NOT DETECTED NOT DETECTED   Candida glabrata NOT DETECTED NOT DETECTED   Candida krusei NOT DETECTED NOT DETECTED   Candida parapsilosis NOT DETECTED NOT DETECTED   Candida tropicalis NOT DETECTED NOT DETECTED    Name of physician (or Provider) Contacted: Dr. Maudie Mercury  Changes to prescribed antibiotics required: Start vancomycin per pharmacy until the final results.  Nani Skillern Crowford 04/12/2016  6:11 AM

## 2016-04-12 NOTE — Progress Notes (Signed)
Pharmacy Antibiotic Note  Wayne Meadows is a 82 y.o. male admitted on 04/11/2016 with bacteremia.  Pharmacy has been consulted for Vancomycin dosing.  Plan: Vancomycin 1500mg  iv x1, then Vancomycin 1250mg  IV every 24 hours.  Goal trough 15-20 mcg/mL.  Height: 6\' 2"  (188 cm) Weight: 220 lb 10.9 oz (100.1 kg) IBW/kg (Calculated) : 82.2  Temp (24hrs), Avg:98.3 F (36.8 C), Min:98.2 F (36.8 C), Max:98.6 F (37 C)   Recent Labs Lab 04/11/16 0320 04/11/16 0332 04/11/16 0619  WBC 23.0*  --   --   CREATININE 1.39*  --   --   LATICACIDVEN  --  2.20* 1.9    Estimated Creatinine Clearance: 54.5 mL/min (by C-G formula based on SCr of 1.39 mg/dL (H)).    Allergies  Allergen Reactions  . Bee Venom Anaphylaxis    Has epi pen  . Influenza Vaccines Other (See Comments)    "Mortally sick for 2 weeks"    Antimicrobials this admission: Ceftriaxone 04/11/2016  >> Azithromycin 04/11/2016  >>  Vancomycin 2/17 >>  Dose adjustments this admission: -  Microbiology results: pending  Thank you for allowing pharmacy to be a part of this patient's care.  Nani Skillern Crowford 04/12/2016 6:28 AM

## 2016-04-12 NOTE — Progress Notes (Signed)
PROGRESS NOTE    Wayne Meadows  OFB:[id number] DOB: 1934-11-25 DOA: 04/11/2016 PCP: PROVIDER NOT IN SYSTEM   Brief Narrative:  Wayne Meadows is a 82 y.o. male with a past medical history significant for COPD not on home O2, CAD s/p PCI, hx CVA, renal Ca, CKD III baseline Cr 1.2 who presents with fever, myalgias, cough for 3 days. He was in his usual state of health until Tuesday night when he developed cough and malaise. Then since Wednesday, he has had progressive fevers, chills, myalgias, congestion, sweats, cough, sputum production, and shortness of breath with wheezing. This didn't improve much with his home albuterol, and tonight had worsened so he came to the Er. Admitted for Sepsis from CAP and found to have 1/4 Blood Cx positive for Methicillin Resistant Staphylococcus species. States he is doing ok and did not get any rest last night.   Assessment & Plan:   Principal Problem:   Sepsis, unspecified organism [facility] Active Problems:   Coronary artery disease due to lipid rich plaque   Community acquired pneumonia of left lower lobe of lung ([place])   Essential hypertension   Centrilobular emphysema (HCC)   COPD exacerbation (HCC)   CKD (chronic kidney disease), stage III   Hyperbilirubinemia   Elevated lactic acid level   Renal insufficiency  1. Sepsis from community-acquired pneumonia:  -Suspected source pneumonia. Organism unknown.  -Patient meets criteria given tachycardia, tachypnea, fever, leukocytosis, and evidence of organ dysfunction.   -Lactate 2.2 mmol/L and repeat was 1.6.  This patient is not at high risk of poor outcomes with a qSOFA score of 1 (at least 2 of the following clinical criteria: respiratory rate of 22/min or greater, altered mentation, or systolic blood pressure of 100 mm Hg or less).   -Antibiotics delivered in the ED.   -Sepsis bundle utilized -Blood cultures drawn and 1/4 show Methicillin Resistant Staphylococcus Speices; sputum culture  ordered -Antibiotics: ceftriaxone and azithromycin Initially and changed to Vancomycin with Positive Blood Cx -WBC went from 23.0 -> 22.2 -Tamiflu Discontinued as Flu Negative -Uurine strep antigen Negative -Repeat renal function and complete blood count in AM -C/w NS at 100 mL/hr -C/w Albuterol Nebs q2hprn and DuoNebs 3 mL 4 Times a Day -Code SEPSIS called to E-link  2. COPD exacerbation:  -Prednisone 40 mg daily for 5 days -Albuterol scheduled and PRN -Continue Combivent -Hold Asmanex while on prednisone  3. Hypertension, CAD and CVA secondary prevention:  -Continue Metoprolol, Enalapril and Amlodipine -Continue Pravachol, Asiprin, Plavix  4. Elevated total bilirubin:  -Improved  5. Chronic Colitis -Continue mesalamine  6. Methacillin Resistant Staphylcocccus Bacteremia -1/4 Blood Cx positive -[place] Contaminant -Was placed on IV Vancomycin -Will Repeat Blood Cx and likely D/C Vanc  6. Hx of Renal Carcinoma -Stable  DVT prophylaxis: Lovenox 40 mg sq Code Status: FULL CODE Family Communication: No Family present at bedside Disposition Plan: Home when Stable  Consultants:   None   Procedures:  None   Antimicrobials:  Anti-infectives    Start     Dose/Rate Route Frequency Ordered Stop   04/13/16 0800  vancomycin (VANCOCIN) 1,250 mg in sodium chloride 0.9 % 250 mL IVPB     1,250 mg 166.7 mL/hr over 90 Minutes Intravenous Every 24 hours 04/12/16 0627     04/12/16 0800  azithromycin (ZITHROMAX) tablet 500 mg     500 mg Oral Every 24 hours 04/11/16 0558 04/19/16 0759   04/12/16 0630  vancomycin (VANCOCIN) 1,500 mg in sodium chloride 0.9 %  500 mL IVPB     1,500 mg 250 mL/hr over 120 Minutes Intravenous  Once 04/12/16 0627 04/12/16 0849   04/12/16 0600  cefTRIAXone (ROCEPHIN) 1 g in dextrose 5 % 50 mL IVPB     1 g 100 mL/hr over 30 Minutes Intravenous Every 24 hours 04/11/16 0558 04/19/16 0559   04/11/16 0515  oseltamivir (TAMIFLU) capsule 30 mg     30 mg  Oral 2 times daily 04/11/16 0451 04/16/16 0959   04/11/16 0445  cefTRIAXone (ROCEPHIN) 1 g in dextrose 5 % 50 mL IVPB     1 g 100 mL/hr over 30 Minutes Intravenous  Once 04/11/16 0437 04/11/16 0544   04/11/16 0445  azithromycin (ZITHROMAX) 500 mg in dextrose 5 % 250 mL IVPB     500 mg 250 mL/hr over 60 Minutes Intravenous  Once 04/11/16 0437 04/11/16 0550     Subjective: Seen and examined and was still wheezing. Did not sleep well. No CP and states he feels slightly better.  Objective: Vitals:   04/11/16 2004 04/12/16 0217 04/12/16 0450 04/12/16 0809  BP: (!) 153/56  (!) 136/57   Pulse: 91  77   Resp: 18  18   Temp: 98.6 F (37 C)  98.2 F (36.8 C)   TempSrc: Oral  Oral   SpO2: 97% 96% 96% 96%  Weight:      Height:        Intake/Output Summary (Last 24 hours) at 04/12/16 1125 Last data filed at 04/12/16 3716  Gross per 24 hour  Intake             2230 ml  Output              502 ml  Net             1728 ml   Filed Weights   04/11/16 0255 04/11/16 0611  Weight: 103.4 kg (228 lb) 100.1 kg (220 lb 10.9 oz)   Examination: Physical Exam:  Constitutional: WN/WD, NAD and appears calm and comfortable Eyes: Lids and conjunctivae normal, sclerae anicteric  ENMT: External Ears, Nose appear normal. Grossly normal hearing. Neck: Appears normal, supple, no cervical masses, normal ROM, no appreciable thyromegaly, no JVD Respiratory: Diminished to auscultation bilaterally with expiratory wheezing; No rales, rhonchi or crackles. Normal respiratory effort and patient is not tachypenic. No accessory muscle use.  Cardiovascular: RRR, no murmurs / rubs / gallops. S1 and S2 auscultated. Mild extremity edema. Abdomen: Soft, non-tender, distended 2/2 to Abdominal Wall Herniation and weakneing. No masses palpated. No appreciable hepatosplenomegaly. Bowel sounds positive x4.  GU: Deferred. Musculoskeletal: No clubbing / cyanosis of digits/nails. No joint deformity upper and lower extremities.  No contractures Skin: No rashes, lesions, ulcers on limited skin evaluation. No induration; Warm and dry.  Neurologic: CN 2-12 grossly intact with no focal deficits. Romberg sign cerebellar reflexes not assessed.  Psychiatric: Normal judgment and insight. Alert and oriented x 3. Normal mood and appropriate affect.   Data Reviewed: I have personally reviewed following labs and imaging studies  CBC:  Recent Labs Lab 04/11/16 0320 04/12/16 0534  WBC 23.0* 22.2*  NEUTROABS 20.0*  --   HGB 13.7 12.2*  HCT 39.8 35.0*  MCV 89.2 88.4  PLT 299 967   Basic Metabolic Panel:  Recent Labs Lab 04/11/16 0320 04/12/16 0534  NA 138 138  K 3.8 3.7  CL 107 110  CO2 22 22  GLUCOSE 145* 224*  BUN 15 24*  CREATININE 1.39* 1.40*  CALCIUM  9.1 8.7*  MG  --  1.9  PHOS  --  1.7*   GFR: Estimated Creatinine Clearance: 54.1 mL/min (by C-G formula based on SCr of 1.4 mg/dL (H)). Liver Function Tests:  Recent Labs Lab 04/11/16 0320 04/11/16 0619 04/12/16 0534  AST 27  --  20  ALT 21  --  17  ALKPHOS 57  --  48  BILITOT 2.1* 1.5* 0.8  PROT 7.7  --  6.4*  ALBUMIN 3.8  --  3.0*   No results for input(s): LIPASE, AMYLASE in the last 168 hours. No results for input(s): AMMONIA in the last 168 hours. Coagulation Profile:  Recent Labs Lab 04/11/16 0320  INR 1.30   Cardiac Enzymes: No results for input(s): CKTOTAL, CKMB, CKMBINDEX, TROPONINI in the last 168 hours. BNP (last 3 results) No results for input(s): PROBNP in the last 8760 hours. HbA1C: No results for input(s): HGBA1C in the last 72 hours. CBG: No results for input(s): GLUCAP in the last 168 hours. Lipid Profile: No results for input(s): CHOL, HDL, LDLCALC, TRIG, CHOLHDL, LDLDIRECT in the last 72 hours. Thyroid Function Tests: No results for input(s): TSH, T4TOTAL, FREET4, T3FREE, THYROIDAB in the last 72 hours. Anemia Panel: No results for input(s): VITAMINB12, FOLATE, FERRITIN, TIBC, IRON, RETICCTPCT in the last 72  hours. Sepsis Labs:  Recent Labs Lab 04/11/16 0332 04/11/16 0619  LATICACIDVEN 2.20* 1.9    Recent Results (from the past 240 hour(s))  Culture, blood (Routine x 2)     Status: None (Preliminary result)   Collection Time: 04/11/16  2:40 AM  Result Value Ref Range Status   Specimen Description BLOOD LEFT HAND  Final   Special Requests BOTTLES DRAWN AEROBIC AND ANAEROBIC 5ML  Final   Culture   Final    NO GROWTH 1 DAY Performed at [facility], [place] [address], [place], [place] [address]    Report Status PENDING  Incomplete  Culture, blood (Routine x 2)     Status: None (Preliminary result)   Collection Time: 04/11/16  3:20 AM  Result Value Ref Range Status   Specimen Description BLOOD RIGHT HAND  Final   Special Requests BOTTLES DRAWN AEROBIC AND ANAEROBIC 5ML  Final   Culture  Setup Time   Final    AEROBIC BOTTLE ONLY GRAM POSITIVE COCCI IN CLUSTERS CRITICAL RESULT CALLED TO, READ BACK BY AND VERIFIED WITH: J GRIMSLEY,PHARMD AT 9735 04/12/16 BY T [place] Performed at [facility], [address], [place], [place] [address]    Culture GRAM POSITIVE COCCI  Final   Report Status PENDING  Incomplete  Blood Culture ID Panel (Reflexed)     Status: Abnormal   Collection Time: 04/11/16  3:20 AM  Result Value Ref Range Status   Enterococcus species NOT DETECTED NOT DETECTED Final   Listeria monocytogenes NOT DETECTED NOT DETECTED Final   Staphylococcus species DETECTED (A) NOT DETECTED Final    Comment: Methicillin (oxacillin) resistant coagulase negative staphylococcus. Possible blood culture contaminant (unless isolated from more than one blood culture draw or clinical case suggests pathogenicity). No antibiotic treatment is indicated for blood  culture contaminants. CRITICAL RESULT CALLED TO, READ BACK BY AND VERIFIED WITH: TO JGRIMSLEY(PHARMD) BY TCLEVELAND AT 6:05AM    Staphylococcus aureus NOT DETECTED NOT DETECTED Final   Methicillin resistance DETECTED (A) NOT  DETECTED Final    Comment: CRITICAL RESULT CALLED TO, READ BACK BY AND VERIFIED WITH: TO JGRIMSLEY(PHARMD) BY TCLEVELAND AT 6:05AM    Streptococcus species NOT  DETECTED NOT DETECTED Final   Streptococcus agalactiae NOT DETECTED NOT DETECTED Final   Streptococcus pneumoniae NOT DETECTED NOT DETECTED Final   Streptococcus pyogenes NOT DETECTED NOT DETECTED Final   Acinetobacter baumannii NOT DETECTED NOT DETECTED Final   Enterobacteriaceae species NOT DETECTED NOT DETECTED Final   Enterobacter cloacae complex NOT DETECTED NOT DETECTED Final   Escherichia coli NOT DETECTED NOT DETECTED Final   Klebsiella oxytoca NOT DETECTED NOT DETECTED Final   Klebsiella pneumoniae NOT DETECTED NOT DETECTED Final   Proteus species NOT DETECTED NOT DETECTED Final   Serratia marcescens NOT DETECTED NOT DETECTED Final   Haemophilus influenzae NOT DETECTED NOT DETECTED Final   Neisseria meningitidis NOT DETECTED NOT DETECTED Final   Pseudomonas aeruginosa NOT DETECTED NOT DETECTED Final   Candida albicans NOT DETECTED NOT DETECTED Final   Candida glabrata NOT DETECTED NOT DETECTED Final   Candida krusei NOT DETECTED NOT DETECTED Final   Candida parapsilosis NOT DETECTED NOT DETECTED Final   Candida tropicalis NOT DETECTED NOT DETECTED Final    Comment: Performed at [facility], [place] [address], [place], [place] [address]  Respiratory Panel by PCR     Status: None   Collection Time: 04/11/16  3:35 PM  Result Value Ref Range Status   Adenovirus NOT DETECTED NOT DETECTED Final   Coronavirus 229E NOT DETECTED NOT DETECTED Final   Coronavirus HKU1 NOT DETECTED NOT DETECTED Final   Coronavirus NL63 NOT DETECTED NOT DETECTED Final   Coronavirus OC43 NOT DETECTED NOT DETECTED Final   Metapneumovirus NOT DETECTED NOT DETECTED Final   Rhinovirus / Enterovirus NOT DETECTED NOT DETECTED Final   Influenza A NOT DETECTED NOT DETECTED Final   Influenza B NOT DETECTED NOT DETECTED Final   Parainfluenza Virus 1  NOT DETECTED NOT DETECTED Final   Parainfluenza Virus 2 NOT DETECTED NOT DETECTED Final   Parainfluenza Virus 3 NOT DETECTED NOT DETECTED Final   Parainfluenza Virus 4 NOT DETECTED NOT DETECTED Final   Respiratory Syncytial Virus NOT DETECTED NOT DETECTED Final   Bordetella pertussis NOT DETECTED NOT DETECTED Final   Chlamydophila pneumoniae NOT DETECTED NOT DETECTED Final   Mycoplasma pneumoniae NOT DETECTED NOT DETECTED Final    Radiology Studies: Dg Chest 2 View  Result Date: 04/11/2016 CLINICAL DATA:  Initial evaluation for acute shortness of breath. EXAM: CHEST  2 VIEW COMPARISON:  Prior radiograph from 11/14/2015. FINDINGS: Cardiac and mediastinal silhouettes are within normal limits. Aortic atherosclerosis. Lungs are hyperinflated with underlying emphysematous changes. Fever. Hazy opacity within the left lower lobe, concerning for pneumonia. Right lung clear. No pulmonary edema or pleural effusion. No pneumothorax. No acute osseous abnormality. IMPRESSION: 1. Patchy and hazy left basilar opacity, concerning for pneumonia. 2. Emphysema. 3. Aortic atherosclerosis. Electronically Signed   By: Jeannine Boga M.D.   On: 04/11/2016 03:54   Scheduled Meds: . amLODipine  5 mg Oral Daily  . aspirin EC  81 mg Oral Daily  . azithromycin  500 mg Oral Q24H  . baclofen  10 mg Oral QHS  . calcium-vitamin D  1 tablet Oral Q breakfast  . cefTRIAXone (ROCEPHIN)  IV  1 g Intravenous Q24H  . clopidogrel  75 mg Oral Daily  . enalapril  20 mg Oral QHS  . enoxaparin (LOVENOX) injection  40 mg Subcutaneous Q24H  . gabapentin  300 mg Oral 2 times per day  . gabapentin  600 mg Oral QHS  . ipratropium-albuterol  3 mL Nebulization QID  . Mesalamine  1,600 mg  Oral Daily  . metoprolol tartrate  12.5 mg Oral BID  . multivitamin with minerals  1 tablet Oral Daily  . oseltamivir  30 mg Oral BID  . pantoprazole  20 mg Oral BID AC  . pravastatin  40 mg Oral QHS  . predniSONE  40 mg Oral Q breakfast  .  [START ON 04/13/2016] vancomycin  1,250 mg Intravenous Q24H   Continuous Infusions: . sodium chloride 100 mL/hr at 04/12/16 0318     LOS: 1 day   Kerney Elbe, DO [facility] Pager [phone]  If 7PM-7AM, please contact night-coverage [email or web address] Password [facility] 04/12/2016, 11:25 AM

## 2016-04-13 LAB — URINE CULTURE

## 2016-04-13 LAB — COMPREHENSIVE METABOLIC PANEL
ALT: 18 U/L (ref 17–63)
AST: 19 U/L (ref 15–41)
Albumin: 2.9 g/dL — ABNORMAL LOW (ref 3.5–5.0)
Alkaline Phosphatase: 46 U/L (ref 38–126)
Anion gap: 5 (ref 5–15)
BUN: 24 mg/dL — AB (ref 6–20)
CHLORIDE: 113 mmol/L — AB (ref 101–111)
CO2: 21 mmol/L — ABNORMAL LOW (ref 22–32)
Calcium: 8.9 mg/dL (ref 8.9–10.3)
Creatinine, Ser: 1.23 mg/dL (ref 0.61–1.24)
GFR calc Af Amer: >60 mL/min (ref >60)
GFR, EST NON AFRICAN AMERICAN: 54 mL/min — AB (ref >60)
Glucose, Bld: 154 mg/dL — ABNORMAL HIGH (ref 65–99)
POTASSIUM: 3.9 mmol/L (ref 3.5–5.1)
SODIUM: 139 mmol/L (ref 135–145)
Total Bilirubin: 0.3 mg/dL (ref 0.3–1.2)
Total Protein: 6 g/dL — ABNORMAL LOW (ref 6.5–8.1)

## 2016-04-13 LAB — CBC WITH DIFFERENTIAL/PLATELET
BASOS ABS: 0 10*3/uL (ref 0.0–0.1)
BASOS PCT: 0 %
EOS ABS: 0 10*3/uL (ref 0.0–0.7)
EOS PCT: 0 %
HCT: 33.2 % — ABNORMAL LOW (ref 39.0–52.0)
Hemoglobin: 11.3 g/dL — ABNORMAL LOW (ref 13.0–17.0)
Lymphocytes Relative: 7 %
Lymphs Abs: 1.2 10*3/uL (ref 0.7–4.0)
MCH: 29.7 pg (ref 26.0–34.0)
MCHC: 34 g/dL (ref 30.0–36.0)
MCV: 87.1 fL (ref 78.0–100.0)
MONO ABS: 1 10*3/uL (ref 0.1–1.0)
Monocytes Relative: 5 %
Neutro Abs: 16.8 10*3/uL — ABNORMAL HIGH (ref 1.7–7.7)
Neutrophils Relative %: 88 %
PLATELETS: 311 10*3/uL (ref 150–400)
RBC: 3.81 MIL/uL — ABNORMAL LOW (ref 4.22–5.81)
RDW: 13.6 % (ref 11.5–15.5)
WBC: 19.1 10*3/uL — ABNORMAL HIGH (ref 4.0–10.5)

## 2016-04-13 LAB — CULTURE, BLOOD (ROUTINE X 2)

## 2016-04-13 LAB — MAGNESIUM: MAGNESIUM: 2 mg/dL (ref 1.7–2.4)

## 2016-04-13 LAB — PHOSPHORUS: PHOSPHORUS: 2.2 mg/dL — AB (ref 2.5–4.6)

## 2016-04-13 MED ORDER — ZOLPIDEM TARTRATE 5 MG PO TABS
5.0000 mg | ORAL_TABLET | Freq: Every evening | ORAL | Status: DC | PRN
  Administered 2016-04-13 – 2016-04-14 (×2): 5 mg via ORAL
  Filled 2016-04-13 (×3): qty 1

## 2016-04-13 NOTE — Progress Notes (Signed)
PROGRESS NOTE    [name]  JOA:[id number] DOB: [birth year] DOA: 04/11/2016 PCP: PROVIDER NOT IN SYSTEM   Brief Narrative:  Wayne Meadows is a 82 y.o. male with a past medical history significant for COPD not on home O2, CAD s/p PCI, hx CVA, renal Ca, CKD III baseline Cr 1.2 who presents with fever, myalgias, cough for 3 days. He was in his usual state of health until Tuesday night when he developed cough and malaise. Then since Wednesday, he has had progressive fevers, chills, myalgias, congestion, sweats, cough, sputum production, and shortness of breath with wheezing. This didn't improve much with his home albuterol, and tonight had worsened so he came to the Er. Admitted for Sepsis from CAP and found to have 1/4 Blood Cx positive for Methicillin Resistant Staphylococcus species. Feels like he is improving.   Assessment & Plan:   Principal Problem:   Sepsis, unspecified organism [facility]) Active Problems:   Coronary artery disease due to lipid rich plaque   Community acquired pneumonia of left lower lobe of lung ([place])   Essential hypertension   Centrilobular emphysema (HCC)   COPD exacerbation (HCC)   CKD (chronic kidney disease), stage III   Hyperbilirubinemia   Elevated lactic acid level   Renal insufficiency  1. Sepsis from community-acquired pneumonia:  -Suspected source pneumonia. Organism unknown.  -Patient meets criteria given tachycardia, tachypnea, fever, leukocytosis, and evidence of organ dysfunction.   -Lactate 2.2 mmol/L and repeat was 1.6.  This patient is not at high risk of poor outcomes with a qSOFA score of 1 (at least 2 of the following clinical criteria: respiratory rate of 22/min or greater, altered mentation, or systolic blood pressure of 100 mm Hg or less).   -Antibiotics delivered in the ED.   -Sepsis bundle utilized -Blood cultures drawn and 1/4 show Methicillin Resistant Staphylococcus Speices; sputum culture ordered -Urine Cx showed <10,00 CFU of  Insignificant Growth -Antibiotics: ceftriaxone and azithromycin Initially and  -D/C'd Vancomycin with Positive Blood Cx -WBC went from 23.0 -> 22.2 -> 19.1 -Tamiflu Discontinued as Flu Negative -Uurine strep antigen Negative -Repeat renal function and complete blood count in AM -C/w NS at 100 mL/hr -C/w Albuterol Nebs q2hprn and DuoNebs 3 mL 4 Times a Day -Code SEPSIS called to E-link -Repeat CXR 2/18 showed findings remain concerning for left lower lobe bronchopneumonia. Chronic changes suggestive of underlying COPD redemonstrated, as above. Aortic atherosclerosis.  2. COPD exacerbation:  -Prednisone 40 mg daily for 5 days -Albuterol scheduled and PRN -Continue Combivent -Hold Asmanex while on prednisone  3. Hypertension, CAD and CVA secondary prevention:  -Continue Metoprolol, Enalapril and Amlodipine -Continue Pravachol, Asiprin, Plavix  4. Elevated total bilirubin:  -Improved  5. Chronic Colitis -Continue mesalamine  6. Methacillin Resistant Staphylcocccus Bacteremia -1/4 Blood Cx positive -[place] Contaminant -Was placed on IV Vancomycin -D/C'd IV Vanc  6. Hx of Renal Carcinoma -Stable  DVT prophylaxis: Lovenox 40 mg sq Code Status: FULL CODE Family Communication: No Family present at bedside Disposition Plan: Home when Stable  Consultants:   None   Procedures:  None   Antimicrobials:  Anti-infectives    Start     Dose/Rate Route Frequency Ordered Stop   04/13/16 0800  vancomycin (VANCOCIN) 1,250 mg in sodium chloride 0.9 % 250 mL IVPB  Status:  Discontinued     1,250 mg 166.7 mL/hr over 90 Minutes Intravenous Every 24 hours 04/12/16 0627 04/13/16 2054   04/12/16 0800  azithromycin (ZITHROMAX) tablet 500 mg  500 mg Oral Every 24 hours 04/11/16 0558 04/19/16 0759   04/12/16 0630  vancomycin (VANCOCIN) 1,500 mg in sodium chloride 0.9 % 500 mL IVPB     1,500 mg 250 mL/hr over 120 Minutes Intravenous  Once 04/12/16 0627 04/12/16 0849   04/12/16  0600  cefTRIAXone (ROCEPHIN) 1 g in dextrose 5 % 50 mL IVPB     1 g 100 mL/hr over 30 Minutes Intravenous Every 24 hours 04/11/16 0558 04/19/16 0559   04/11/16 0515  oseltamivir (TAMIFLU) capsule 30 mg  Status:  Discontinued     30 mg Oral 2 times daily 04/11/16 0451 04/12/16 1743   04/11/16 0445  cefTRIAXone (ROCEPHIN) 1 g in dextrose 5 % 50 mL IVPB     1 g 100 mL/hr over 30 Minutes Intravenous  Once 04/11/16 0437 04/11/16 0544   04/11/16 0445  azithromycin (ZITHROMAX) 500 mg in dextrose 5 % 250 mL IVPB     500 mg 250 mL/hr over 60 Minutes Intravenous  Once 04/11/16 0437 04/11/16 0550     Subjective: Seen and examined and was still wheezing. Did not sleep well. No CP and states he feels slightly better.  Objective: Vitals:   04/13/16 0731 04/13/16 1017 04/13/16 1213 04/13/16 1531  BP:    (!) 148/55  Pulse:    90  Resp:    20  Temp:    98.2 F (36.8 C)  TempSrc:    Oral  SpO2: 94% 96% 91% 95%  Weight:      Height:        Intake/Output Summary (Last 24 hours) at 04/13/16 2057 Last data filed at 04/13/16 1914  Gross per 24 hour  Intake             1730 ml  Output                7 ml  Net             1723 ml   Filed Weights   04/11/16 0255 04/11/16 0611  Weight: 103.4 kg (228 lb) 100.1 kg (220 lb 10.9 oz)   Examination: Physical Exam:  Constitutional: WN/WD, NAD and appears calm and comfortable Eyes: Lids and conjunctivae normal, sclerae anicteric  ENMT: External Ears, Nose appear normal. Grossly normal hearing. Neck: Appears normal, supple, no cervical masses, normal ROM, no appreciable thyromegaly, no JVD Respiratory: Diminished to auscultation bilaterally with some wheezing; No rales, rhonchi or crackles. Normal respiratory effort and patient is not tachypenic. No accessory muscle use.  Cardiovascular: RRR, no murmurs / rubs / gallops. S1 and S2 auscultated. Mild extremity edema.  Abdomen: Soft, non-tender, distended 2/2 to Abdominal Wall Herniation and weakneing. No  masses palpated. No appreciable hepatosplenomegaly. Bowel sounds positive x4.  GU: Deferred. Musculoskeletal: No clubbing / cyanosis of digits/nails. No joint deformity upper and lower extremities. No contractures Skin: No rashes, lesions, ulcers on limited skin evaluation. No induration; Warm and dry.  Neurologic: CN 2-12 grossly intact with no focal deficits. Romberg sign cerebellar reflexes not assessed.  Psychiatric: Normal judgment and insight. Alert and oriented x 3. Normal mood and appropriate affect.   Data Reviewed: I have personally reviewed following labs and imaging studies  CBC:  Recent Labs Lab 04/11/16 0320 04/12/16 0534 04/13/16 0535  WBC 23.0* 22.2* 19.1*  NEUTROABS 20.0*  --  16.8*  HGB 13.7 12.2* 11.3*  HCT 39.8 35.0* 33.2*  MCV 89.2 88.4 87.1  PLT 299 253 132   Basic Metabolic Panel:  Recent Labs  Lab 04/11/16 0320 04/12/16 0534 04/13/16 0535  NA 138 138 139  K 3.8 3.7 3.9  CL 107 110 113*  CO2 22 22 21*  GLUCOSE 145* 224* 154*  BUN 15 24* 24*  CREATININE 1.39* 1.40* 1.23  CALCIUM 9.1 8.7* 8.9  MG  --  1.9 2.0  PHOS  --  1.7* 2.2*   GFR: Estimated Creatinine Clearance: 61.6 mL/min (by C-G formula based on SCr of 1.23 mg/dL). Liver Function Tests:  Recent Labs Lab 04/11/16 0320 04/11/16 0619 04/12/16 0534 04/13/16 0535  AST 27  --  20 19  ALT 21  --  17 18  ALKPHOS 57  --  48 46  BILITOT 2.1* 1.5* 0.8 0.3  PROT 7.7  --  6.4* 6.0*  ALBUMIN 3.8  --  3.0* 2.9*   No results for input(s): LIPASE, AMYLASE in the last 168 hours. No results for input(s): AMMONIA in the last 168 hours. Coagulation Profile:  Recent Labs Lab 04/11/16 0320  INR 1.30   Cardiac Enzymes: No results for input(s): CKTOTAL, CKMB, CKMBINDEX, TROPONINI in the last 168 hours. BNP (last 3 results) No results for input(s): PROBNP in the last 8760 hours. HbA1C: No results for input(s): HGBA1C in the last 72 hours. CBG: No results for input(s): GLUCAP in the last 168  hours. Lipid Profile: No results for input(s): CHOL, HDL, LDLCALC, TRIG, CHOLHDL, LDLDIRECT in the last 72 hours. Thyroid Function Tests: No results for input(s): TSH, T4TOTAL, FREET4, T3FREE, THYROIDAB in the last 72 hours. Anemia Panel: No results for input(s): VITAMINB12, FOLATE, FERRITIN, TIBC, IRON, RETICCTPCT in the last 72 hours. Sepsis Labs:  Recent Labs Lab 04/11/16 0332 04/11/16 0619  LATICACIDVEN 2.20* 1.9    Recent Results (from the past 240 hour(s))  Culture, blood (Routine x 2)     Status: None (Preliminary result)   Collection Time: 04/11/16  2:40 AM  Result Value Ref Range Status   Specimen Description BLOOD LEFT HAND  Final   Special Requests BOTTLES DRAWN AEROBIC AND ANAEROBIC 5ML  Final   Culture   Final    NO GROWTH 2 DAYS Performed at [facility], [place] [address], [place], [place] [address]    Report Status PENDING  Incomplete  Culture, blood (Routine x 2)     Status: Abnormal   Collection Time: 04/11/16  3:20 AM  Result Value Ref Range Status   Specimen Description BLOOD RIGHT HAND  Final   Special Requests BOTTLES DRAWN AEROBIC AND ANAEROBIC 5ML  Final   Culture  Setup Time   Final    AEROBIC BOTTLE ONLY GRAM POSITIVE COCCI IN CLUSTERS CRITICAL RESULT CALLED TO, READ BACK BY AND VERIFIED WITH: J GRIMSLEY,PHARMD AT 1638 04/12/16 BY T [place]    Culture (A)  Final    STAPHYLOCOCCUS SPECIES (COAGULASE NEGATIVE) THE SIGNIFICANCE OF ISOLATING THIS ORGANISM FROM A SINGLE SET OF BLOOD CULTURES WHEN MULTIPLE SETS ARE DRAWN IS UNCERTAIN. PLEASE NOTIFY THE MICROBIOLOGY DEPARTMENT WITHIN ONE WEEK IF SPECIATION AND SENSITIVITIES ARE REQUIRED. Performed at [facility], [place] [address] [place], [place] [address]    Report Status 04/13/2016 FINAL  Final  Blood Culture ID Panel (Reflexed)     Status: Abnormal   Collection Time: 04/11/16  3:20 AM  Result Value Ref Range Status   Enterococcus species NOT DETECTED NOT DETECTED Final   Listeria  monocytogenes NOT DETECTED NOT DETECTED Final   Staphylococcus species DETECTED (A) NOT DETECTED Final    Comment: Methicillin (oxacillin)  resistant coagulase negative staphylococcus. Possible blood culture contaminant (unless isolated from more than one blood culture draw or clinical case suggests pathogenicity). No antibiotic treatment is indicated for blood  culture contaminants. CRITICAL RESULT CALLED TO, READ BACK BY AND VERIFIED WITH: TO JGRIMSLEY(PHARMD) BY TCLEVELAND AT 6:05AM    Staphylococcus aureus NOT DETECTED NOT DETECTED Final   Methicillin resistance DETECTED (A) NOT DETECTED Final    Comment: CRITICAL RESULT CALLED TO, READ BACK BY AND VERIFIED WITH: TO JGRIMSLEY(PHARMD) BY TCLEVELAND AT 6:05AM    Streptococcus species NOT DETECTED NOT DETECTED Final   Streptococcus agalactiae NOT DETECTED NOT DETECTED Final   Streptococcus pneumoniae NOT DETECTED NOT DETECTED Final   Streptococcus pyogenes NOT DETECTED NOT DETECTED Final   Acinetobacter baumannii NOT DETECTED NOT DETECTED Final   Enterobacteriaceae species NOT DETECTED NOT DETECTED Final   Enterobacter cloacae complex NOT DETECTED NOT DETECTED Final   Escherichia coli NOT DETECTED NOT DETECTED Final   Klebsiella oxytoca NOT DETECTED NOT DETECTED Final   Klebsiella pneumoniae NOT DETECTED NOT DETECTED Final   Proteus species NOT DETECTED NOT DETECTED Final   Serratia marcescens NOT DETECTED NOT DETECTED Final   Haemophilus influenzae NOT DETECTED NOT DETECTED Final   Neisseria meningitidis NOT DETECTED NOT DETECTED Final   Pseudomonas aeruginosa NOT DETECTED NOT DETECTED Final   Candida albicans NOT DETECTED NOT DETECTED Final   Candida glabrata NOT DETECTED NOT DETECTED Final   Candida krusei NOT DETECTED NOT DETECTED Final   Candida parapsilosis NOT DETECTED NOT DETECTED Final   Candida tropicalis NOT DETECTED NOT DETECTED Final    Comment: Performed at [facility], [address] [place], [place] [address]    Respiratory Panel by PCR     Status: None   Collection Time: 04/11/16  3:35 PM  Result Value Ref Range Status   Adenovirus NOT DETECTED NOT DETECTED Final   Coronavirus 229E NOT DETECTED NOT DETECTED Final   Coronavirus HKU1 NOT DETECTED NOT DETECTED Final   Coronavirus NL63 NOT DETECTED NOT DETECTED Final   Coronavirus OC43 NOT DETECTED NOT DETECTED Final   Metapneumovirus NOT DETECTED NOT DETECTED Final   Rhinovirus / Enterovirus NOT DETECTED NOT DETECTED Final   Influenza A NOT DETECTED NOT DETECTED Final   Influenza B NOT DETECTED NOT DETECTED Final   Parainfluenza Virus 1 NOT DETECTED NOT DETECTED Final   Parainfluenza Virus 2 NOT DETECTED NOT DETECTED Final   Parainfluenza Virus 3 NOT DETECTED NOT DETECTED Final   Parainfluenza Virus 4 NOT DETECTED NOT DETECTED Final   Respiratory Syncytial Virus NOT DETECTED NOT DETECTED Final   Bordetella pertussis NOT DETECTED NOT DETECTED Final   Chlamydophila pneumoniae NOT DETECTED NOT DETECTED Final   Mycoplasma pneumoniae NOT DETECTED NOT DETECTED Final  Urine culture     Status: Abnormal   Collection Time: 04/11/16  7:20 PM  Result Value Ref Range Status   Specimen Description URINE, RANDOM  Final   Special Requests NONE  Final   Culture (A)  Final    <10,000 COLONIES/mL INSIGNIFICANT GROWTH Performed at [facility], [address], [place], [place] [address]    Report Status 04/13/2016 FINAL  Final    Radiology Studies: Dg Chest 2 View  Result Date: 04/12/2016 CLINICAL DATA:  82 year old male with history of congestion. Follow-up for prior history of pneumonia. EXAM: CHEST  2 VIEW COMPARISON:  Chest x-ray 04/11/2016. FINDINGS: Lungs remain hyperexpanded with flattening of the hemidiaphragms, increased retrosternal airspace, and mild emphysematous changes, suggestive of underlying  COPD. Chronic scarring in the right lung base is unchanged. Ill-defined patchy opacity and interstitial prominence in the left base remains  concerning for bronchopneumonia in the left lower lobe. No definite pleural effusions (although there is chronic right basilar pleural scarring). No evidence of pulmonary edema. Heart size is normal. Upper mediastinal contours are within normal limits. Aortic atherosclerosis. IMPRESSION: 1. Findings remain concerning for left lower lobe bronchopneumonia. 2. Chronic changes suggestive of underlying COPD redemonstrated, as above. 3. Aortic atherosclerosis. Electronically Signed   By: Vinnie Langton M.D.   On: 04/12/2016 16:30   Scheduled Meds: . amLODipine  5 mg Oral Daily  . aspirin EC  81 mg Oral Daily  . azithromycin  500 mg Oral Q24H  . baclofen  10 mg Oral QHS  . calcium-vitamin D  1 tablet Oral Q breakfast  . cefTRIAXone (ROCEPHIN)  IV  1 g Intravenous Q24H  . clopidogrel  75 mg Oral Daily  . enalapril  20 mg Oral QHS  . enoxaparin (LOVENOX) injection  40 mg Subcutaneous Q24H  . gabapentin  300 mg Oral 2 times per day  . gabapentin  600 mg Oral QHS  . ipratropium-albuterol  3 mL Nebulization QID  . Mesalamine  1,600 mg Oral Daily  . metoprolol tartrate  12.5 mg Oral BID  . multivitamin with minerals  1 tablet Oral Daily  . pantoprazole  20 mg Oral BID AC  . pravastatin  40 mg Oral QHS  . predniSONE  40 mg Oral Q breakfast   Continuous Infusions: . sodium chloride 100 mL/hr at 04/13/16 1522    LOS: 2 days   Kerney Elbe, DO [facility] Pager [phone]  If 7PM-7AM, please contact night-coverage [email or web address] Password [facility] 04/13/2016, 8:57 PM

## 2016-04-14 LAB — COMPREHENSIVE METABOLIC PANEL
ALK PHOS: 60 U/L (ref 38–126)
ALT: 21 U/L (ref 17–63)
AST: 22 U/L (ref 15–41)
Albumin: 3.1 g/dL — ABNORMAL LOW (ref 3.5–5.0)
Anion gap: 7 (ref 5–15)
BILIRUBIN TOTAL: 0.5 mg/dL (ref 0.3–1.2)
BUN: 18 mg/dL (ref 6–20)
CALCIUM: 9.1 mg/dL (ref 8.9–10.3)
CO2: 22 mmol/L (ref 22–32)
CREATININE: 1.07 mg/dL (ref 0.61–1.24)
Chloride: 112 mmol/L — ABNORMAL HIGH (ref 101–111)
GFR calc Af Amer: >60 mL/min (ref >60)
GLUCOSE: 119 mg/dL — AB (ref 65–99)
Potassium: 3.7 mmol/L (ref 3.5–5.1)
Sodium: 141 mmol/L (ref 135–145)
TOTAL PROTEIN: 6.7 g/dL (ref 6.5–8.1)

## 2016-04-14 LAB — CBC WITH DIFFERENTIAL/PLATELET
BASOS ABS: 0 10*3/uL (ref 0.0–0.1)
BASOS PCT: 0 %
Eosinophils Absolute: 0 10*3/uL (ref 0.0–0.7)
Eosinophils Relative: 0 %
HEMATOCRIT: 35.7 % — AB (ref 39.0–52.0)
HEMOGLOBIN: 12.2 g/dL — AB (ref 13.0–17.0)
LYMPHS PCT: 15 %
Lymphs Abs: 2.3 10*3/uL (ref 0.7–4.0)
MCH: 30.6 pg (ref 26.0–34.0)
MCHC: 34.2 g/dL (ref 30.0–36.0)
MCV: 89.5 fL (ref 78.0–100.0)
MONOS PCT: 6 %
Monocytes Absolute: 0.9 10*3/uL (ref 0.1–1.0)
NEUTROS ABS: 11.7 10*3/uL — AB (ref 1.7–7.7)
NEUTROS PCT: 79 %
Platelets: 343 10*3/uL (ref 150–400)
RBC: 3.99 MIL/uL — ABNORMAL LOW (ref 4.22–5.81)
RDW: 13.7 % (ref 11.5–15.5)
WBC: 14.9 10*3/uL — ABNORMAL HIGH (ref 4.0–10.5)

## 2016-04-14 LAB — CREATININE, SERUM: Creatinine, Ser: 1.11 mg/dL (ref 0.61–1.24)

## 2016-04-14 LAB — MAGNESIUM: Magnesium: 2 mg/dL (ref 1.7–2.4)

## 2016-04-14 LAB — PHOSPHORUS: Phosphorus: 2.9 mg/dL (ref 2.5–4.6)

## 2016-04-14 MED ORDER — DM-GUAIFENESIN ER 30-600 MG PO TB12
1.0000 | ORAL_TABLET | Freq: Two times a day (BID) | ORAL | Status: DC
  Administered 2016-04-14 – 2016-04-16 (×5): 1 via ORAL
  Filled 2016-04-14 (×5): qty 1

## 2016-04-14 MED ORDER — SODIUM CHLORIDE 3 % IN NEBU
4.0000 mL | INHALATION_SOLUTION | Freq: Every day | RESPIRATORY_TRACT | Status: DC
  Administered 2016-04-16: 4 mL via RESPIRATORY_TRACT
  Filled 2016-04-14 (×4): qty 15

## 2016-04-14 NOTE — Progress Notes (Addendum)
PROGRESS NOTE    Wayne Meadows  NOM:[id number] DOB: 1934/04/17 DOA: 04/11/2016 PCP: PROVIDER NOT IN SYSTEM   Brief Narrative:  Wayne Meadows is a 82 y.o. male with a past medical history significant for COPD not on home O2, CAD s/p PCI, hx CVA, renal Ca, CKD III baseline Cr 1.2 who presents with fever, myalgias, cough for 3 days. He was in his usual state of health until Tuesday night when he developed cough and malaise. Then since Wednesday, he has had progressive fevers, chills, myalgias, congestion, sweats, cough, sputum production, and shortness of breath with wheezing. This didn't improve much with his home albuterol, and tonight had worsened so he came to the Er. Admitted for Sepsis from CAP and found to have 1/4 Blood Cx positive for Methicillin Resistant Staphylococcus species. Feels like he is improving.   Assessment & Plan:   Principal Problem:   Sepsis, unspecified organism [facility]) Active Problems:   Coronary artery disease due to lipid rich plaque   Community acquired pneumonia of left lower lobe of lung ([place])   Essential hypertension   Centrilobular emphysema (HCC)   COPD exacerbation (HCC)   CKD (chronic kidney disease), stage III   Hyperbilirubinemia   Elevated lactic acid level   Renal insufficiency  1. Sepsis from community-acquired pneumonia, improving -Sepsis Physiology improving -Suspected source pneumonia. Organism unknown.  -Patient meets criteria given tachycardia, tachypnea, fever, leukocytosis, and evidence of organ dysfunction.   -Lactate 2.2 mmol/L and repeat was 1.6.  This patient is not at high risk of poor outcomes with a qSOFA score of 1 (at least 2 of the following clinical criteria: respiratory rate of 22/min or greater, altered mentation, or systolic blood pressure of 100 mm Hg or less).   -Antibiotics delivered in the ED.   -Sepsis bundle utilized -Blood cultures drawn and 1/4 show Methicillin Resistant Staphylococcus Speices; sputum culture  ordered; Repeat Blood Cx pending -Urine Cx showed <10,00 CFU of Insignificant Growth -Antibiotics: Ceftriaxone and Azithromycin  -D/C'd Vancomycin with Positive Blood Cx (likely contaminant)  -WBC went from 23.0 -> 22.2 -> 19.1 -> 14.9 -Tamiflu Discontinued as Flu Negative -Uurine strep antigen Negative -Repeat renal function and complete blood count in AM -D/C'd NS at 100 mL/hr -C/w Albuterol Nebs q2hprn and DuoNebs 3 mL 4 Times a Day -Code SEPSIS called to E-link -Repeat CXR 2/18 showed findings remain concerning for left lower lobe bronchopneumonia. Chronic changes suggestive of underlying COPD redemonstrated, as above. Aortic atherosclerosis.  2. COPD exacerbation:  -Prednisone 40 mg daily for 5 days -Albuterol scheduled and PRN -Continue Combivent -Hold Asmanex while on prednisone  3. Hypertension, CAD and CVA secondary prevention:  -Continue Metoprolol 12.5 mg po BID, Enalapril 20 mg po Daily and Amlodipine 5 mg po Daily -Continue Pravachol 40 mg po Daily, Asiprin, Plavix 75 mg  4. Elevated total bilirubin:  -Improved  5. Chronic Colitis -Continue Mesalamine  6. Methacillin Resistant Staphylcocccus Bacteremia -1/4 Blood Cx positive; Repeat Blood Cx showed NGTD < 12 hours -[place] Contaminant -Was placed on IV Vancomycin -D/C'd IV Vanc  7. Hx of Renal Carcinoma -Stable  8. CKD Stage III -Patient's Cr was 1.4 on admission and improved to 1.07 with IVF -Baseline Cr was 1.2 -Repeat CMP in AM   DVT prophylaxis: Lovenox 40 mg sq Code Status: FULL CODE Family Communication: No Family present at bedside Disposition Plan: Home when Stable  Consultants:   None   Procedures:  None   Antimicrobials:  Anti-infectives    Start  Dose/Rate Route Frequency Ordered Stop   04/13/16 0800  vancomycin (VANCOCIN) 1,250 mg in sodium chloride 0.9 % 250 mL IVPB  Status:  Discontinued     1,250 mg 166.7 mL/hr over 90 Minutes Intravenous Every 24 hours 04/12/16 0627  04/13/16 2054   04/12/16 0800  azithromycin (ZITHROMAX) tablet 500 mg     500 mg Oral Every 24 hours 04/11/16 0558 04/19/16 0759   04/12/16 0630  vancomycin (VANCOCIN) 1,500 mg in sodium chloride 0.9 % 500 mL IVPB     1,500 mg 250 mL/hr over 120 Minutes Intravenous  Once 04/12/16 0627 04/12/16 0849   04/12/16 0600  cefTRIAXone (ROCEPHIN) 1 g in dextrose 5 % 50 mL IVPB     1 g 100 mL/hr over 30 Minutes Intravenous Every 24 hours 04/11/16 0558 04/19/16 0559   04/11/16 0515  oseltamivir (TAMIFLU) capsule 30 mg  Status:  Discontinued     30 mg Oral 2 times daily 04/11/16 0451 04/12/16 1743   04/11/16 0445  cefTRIAXone (ROCEPHIN) 1 g in dextrose 5 % 50 mL IVPB     1 g 100 mL/hr over 30 Minutes Intravenous  Once 04/11/16 0437 04/11/16 0544   04/11/16 0445  azithromycin (ZITHROMAX) 500 mg in dextrose 5 % 250 mL IVPB     500 mg 250 mL/hr over 60 Minutes Intravenous  Once 04/11/16 0437 04/11/16 0550     Subjective: Seen and examined and was still wheezing diffusely. Thinks he is better than when he came in. No Nausea or Vomiting.   Objective: Vitals:   04/14/16 1233 04/14/16 1405 04/14/16 1500 04/14/16 1627  BP:  135/64  (!) 155/72  Pulse:  88  88  Resp:  20  18  Temp:  98.4 F (36.9 C)  99 F (37.2 C)  TempSrc:  Oral  Oral  SpO2: 92% 95% 93% 94%  Weight:      Height:        Intake/Output Summary (Last 24 hours) at 04/14/16 1902 Last data filed at 04/14/16 1507  Gross per 24 hour  Intake              900 ml  Output                8 ml  Net              892 ml   Filed Weights   04/11/16 0255 04/11/16 0611  Weight: 103.4 kg (228 lb) 100.1 kg (220 lb 10.9 oz)   Examination: Physical Exam:  Constitutional: WN/WD, NAD and appears calm and comfortable Eyes: Lids and conjunctivae normal, sclerae anicteric  ENMT: External Ears, Nose appear normal. Grossly normal hearing. Neck: Appears normal, supple, no cervical masses, normal ROM, no appreciable thyromegaly, no JVD Respiratory:  Diminished to auscultation bilaterally with expiratory wheezing; No rales, rhonchi or crackles. Normal respiratory effort and patient is not tachypenic. No accessory muscle use.  Cardiovascular: RRR, no murmurs / rubs / gallops. S1 and S2 auscultated. Mild extremity edema.  Abdomen: Soft, non-tender, distended 2/2 to Abdominal Wall Herniation and weakneing. No masses palpated. No appreciable hepatosplenomegaly. Bowel sounds positive x4.  GU: Deferred. Musculoskeletal: No clubbing / cyanosis of digits/nails. No joint deformity upper and lower extremities. No contractures Skin: No rashes, lesions, ulcers on limited skin evaluation. No induration; Warm and dry.  Neurologic: CN 2-12 grossly intact with no focal deficits. Romberg sign cerebellar reflexes not assessed.  Psychiatric: Normal judgment and insight. Alert and oriented x 3. Normal mood and  appropriate affect.   Data Reviewed: I have personally reviewed following labs and imaging studies  CBC:  Recent Labs Lab 04/11/16 0320 04/12/16 0534 04/13/16 0535 04/14/16 0546  WBC 23.0* 22.2* 19.1* 14.9*  NEUTROABS 20.0*  --  16.8* 11.7*  HGB 13.7 12.2* 11.3* 12.2*  HCT 39.8 35.0* 33.2* 35.7*  MCV 89.2 88.4 87.1 89.5  PLT 299 253 311 829   Basic Metabolic Panel:  Recent Labs Lab 04/11/16 0320 04/12/16 0534 04/13/16 0535 04/14/16 0546  NA 138 138 139 141  K 3.8 3.7 3.9 3.7  CL 107 110 113* 112*  CO2 22 22 21* 22  GLUCOSE 145* 224* 154* 119*  BUN 15 24* 24* 18  CREATININE 1.39* 1.40* 1.23 1.07  1.11  CALCIUM 9.1 8.7* 8.9 9.1  MG  --  1.9 2.0 2.0  PHOS  --  1.7* 2.2* 2.9   GFR: Estimated Creatinine Clearance: 68.2 mL/min (by C-G formula based on SCr of 1.11 mg/dL). Liver Function Tests:  Recent Labs Lab 04/11/16 0320 04/11/16 0619 04/12/16 0534 04/13/16 0535 04/14/16 0546  AST 27  --  20 19 22   ALT 21  --  17 18 21   ALKPHOS 57  --  48 46 60  BILITOT 2.1* 1.5* 0.8 0.3 0.5  PROT 7.7  --  6.4* 6.0* 6.7  ALBUMIN 3.8   --  3.0* 2.9* 3.1*   No results for input(s): LIPASE, AMYLASE in the last 168 hours. No results for input(s): AMMONIA in the last 168 hours. Coagulation Profile:  Recent Labs Lab 04/11/16 0320  INR 1.30   Cardiac Enzymes: No results for input(s): CKTOTAL, CKMB, CKMBINDEX, TROPONINI in the last 168 hours. BNP (last 3 results) No results for input(s): PROBNP in the last 8760 hours. HbA1C: No results for input(s): HGBA1C in the last 72 hours. CBG: No results for input(s): GLUCAP in the last 168 hours. Lipid Profile: No results for input(s): CHOL, HDL, LDLCALC, TRIG, CHOLHDL, LDLDIRECT in the last 72 hours. Thyroid Function Tests: No results for input(s): TSH, T4TOTAL, FREET4, T3FREE, THYROIDAB in the last 72 hours. Anemia Panel: No results for input(s): VITAMINB12, FOLATE, FERRITIN, TIBC, IRON, RETICCTPCT in the last 72 hours. Sepsis Labs:  Recent Labs Lab 04/11/16 0332 04/11/16 0619  LATICACIDVEN 2.20* 1.9    Recent Results (from the past 240 hour(s))  Culture, blood (Routine x 2)     Status: None (Preliminary result)   Collection Time: 04/11/16  2:40 AM  Result Value Ref Range Status   Specimen Description BLOOD LEFT HAND  Final   Special Requests BOTTLES DRAWN AEROBIC AND ANAEROBIC 5ML  Final   Culture   Final    NO GROWTH 3 DAYS Performed at [facility], [place] [address] [place], [place] [address]    Report Status PENDING  Incomplete  Culture, blood (Routine x 2)     Status: Abnormal   Collection Time: 04/11/16  3:20 AM  Result Value Ref Range Status   Specimen Description BLOOD RIGHT HAND  Final   Special Requests BOTTLES DRAWN AEROBIC AND ANAEROBIC 5ML  Final   Culture  Setup Time   Final    AEROBIC BOTTLE ONLY GRAM POSITIVE COCCI IN CLUSTERS CRITICAL RESULT CALLED TO, READ BACK BY AND VERIFIED WITH: J GRIMSLEY,PHARMD AT 0865 04/12/16 BY T [place]    Culture (A)  Final    STAPHYLOCOCCUS SPECIES (COAGULASE NEGATIVE) THE SIGNIFICANCE OF ISOLATING THIS  ORGANISM FROM A SINGLE SET OF BLOOD CULTURES WHEN MULTIPLE SETS ARE DRAWN  IS UNCERTAIN. PLEASE NOTIFY THE MICROBIOLOGY DEPARTMENT WITHIN ONE WEEK IF SPECIATION AND SENSITIVITIES ARE REQUIRED. Performed at [facility], [place] [address] [place], [place] [address]    Report Status 04/13/2016 FINAL  Final  Blood Culture ID Panel (Reflexed)     Status: Abnormal   Collection Time: 04/11/16  3:20 AM  Result Value Ref Range Status   Enterococcus species NOT DETECTED NOT DETECTED Final   Listeria monocytogenes NOT DETECTED NOT DETECTED Final   Staphylococcus species DETECTED (A) NOT DETECTED Final    Comment: Methicillin (oxacillin) resistant coagulase negative staphylococcus. Possible blood culture contaminant (unless isolated from more than one blood culture draw or clinical case suggests pathogenicity). No antibiotic treatment is indicated for blood  culture contaminants. CRITICAL RESULT CALLED TO, READ BACK BY AND VERIFIED WITH: TO JGRIMSLEY(PHARMD) BY TCLEVELAND AT 6:05AM    Staphylococcus aureus NOT DETECTED NOT DETECTED Final   Methicillin resistance DETECTED (A) NOT DETECTED Final    Comment: CRITICAL RESULT CALLED TO, READ BACK BY AND VERIFIED WITH: TO JGRIMSLEY(PHARMD) BY TCLEVELAND AT 6:05AM    Streptococcus species NOT DETECTED NOT DETECTED Final   Streptococcus agalactiae NOT DETECTED NOT DETECTED Final   Streptococcus pneumoniae NOT DETECTED NOT DETECTED Final   Streptococcus pyogenes NOT DETECTED NOT DETECTED Final   Acinetobacter baumannii NOT DETECTED NOT DETECTED Final   Enterobacteriaceae species NOT DETECTED NOT DETECTED Final   Enterobacter cloacae complex NOT DETECTED NOT DETECTED Final   Escherichia coli NOT DETECTED NOT DETECTED Final   Klebsiella oxytoca NOT DETECTED NOT DETECTED Final   Klebsiella pneumoniae NOT DETECTED NOT DETECTED Final   Proteus species NOT DETECTED NOT DETECTED Final   Serratia marcescens NOT DETECTED NOT DETECTED Final   Haemophilus  influenzae NOT DETECTED NOT DETECTED Final   Neisseria meningitidis NOT DETECTED NOT DETECTED Final   Pseudomonas aeruginosa NOT DETECTED NOT DETECTED Final   Candida albicans NOT DETECTED NOT DETECTED Final   Candida glabrata NOT DETECTED NOT DETECTED Final   Candida krusei NOT DETECTED NOT DETECTED Final   Candida parapsilosis NOT DETECTED NOT DETECTED Final   Candida tropicalis NOT DETECTED NOT DETECTED Final    Comment: Performed at [facility], [address] [place], [place] [address]  Respiratory Panel by PCR     Status: None   Collection Time: 04/11/16  3:35 PM  Result Value Ref Range Status   Adenovirus NOT DETECTED NOT DETECTED Final   Coronavirus 229E NOT DETECTED NOT DETECTED Final   Coronavirus HKU1 NOT DETECTED NOT DETECTED Final   Coronavirus NL63 NOT DETECTED NOT DETECTED Final   Coronavirus OC43 NOT DETECTED NOT DETECTED Final   Metapneumovirus NOT DETECTED NOT DETECTED Final   Rhinovirus / Enterovirus NOT DETECTED NOT DETECTED Final   Influenza A NOT DETECTED NOT DETECTED Final   Influenza B NOT DETECTED NOT DETECTED Final   Parainfluenza Virus 1 NOT DETECTED NOT DETECTED Final   Parainfluenza Virus 2 NOT DETECTED NOT DETECTED Final   Parainfluenza Virus 3 NOT DETECTED NOT DETECTED Final   Parainfluenza Virus 4 NOT DETECTED NOT DETECTED Final   Respiratory Syncytial Virus NOT DETECTED NOT DETECTED Final   Bordetella pertussis NOT DETECTED NOT DETECTED Final   Chlamydophila pneumoniae NOT DETECTED NOT DETECTED Final   Mycoplasma pneumoniae NOT DETECTED NOT DETECTED Final  Urine culture     Status: Abnormal   Collection Time: 04/11/16  7:20 PM  Result Value Ref Range Status   Specimen Description URINE, RANDOM  Final   Special  Requests NONE  Final   Culture (A)  Final    <10,000 COLONIES/mL INSIGNIFICANT GROWTH Performed at [facility], [place] [address] [place], [place] [address]    Report Status 04/13/2016 FINAL  Final  Culture, blood (Routine X  2) w Reflex to ID Panel     Status: None (Preliminary result)   Collection Time: 04/13/16  9:51 PM  Result Value Ref Range Status   Specimen Description BLOOD LEFT ARM  Final   Special Requests BOTTLES DRAWN AEROBIC AND ANAEROBIC 5 CC  Final   Culture   Final    NO GROWTH < 12 HOURS Performed at [facility], [place] [address], [place], [place] [address]    Report Status PENDING  Incomplete  Culture, blood (Routine X 2) w Reflex to ID Panel     Status: None (Preliminary result)   Collection Time: 04/13/16  9:51 PM  Result Value Ref Range Status   Specimen Description BLOOD LEFT HAND  Final   Special Requests IN PEDIATRIC BOTTLE 3 CC  Final   Culture   Final    NO GROWTH < 12 HOURS Performed at [facility], [place] [address] [place],  [address]    Report Status PENDING  Incomplete    Radiology Studies: No results found. Scheduled Meds: . amLODipine  5 mg Oral Daily  . aspirin EC  81 mg Oral Daily  . azithromycin  500 mg Oral Q24H  . baclofen  10 mg Oral QHS  . calcium-vitamin D  1 tablet Oral Q breakfast  . cefTRIAXone (ROCEPHIN)  IV  1 g Intravenous Q24H  . clopidogrel  75 mg Oral Daily  . dextromethorphan-guaiFENesin  1 tablet Oral BID  . enalapril  20 mg Oral QHS  . enoxaparin (LOVENOX) injection  40 mg Subcutaneous Q24H  . gabapentin  300 mg Oral 2 times per day  . gabapentin  600 mg Oral QHS  . ipratropium-albuterol  3 mL Nebulization QID  . Mesalamine  1,600 mg Oral Daily  . metoprolol tartrate  12.5 mg Oral BID  . multivitamin with minerals  1 tablet Oral Daily  . pantoprazole  20 mg Oral BID AC  . pravastatin  40 mg Oral QHS  . predniSONE  40 mg Oral Q breakfast  . sodium chloride HYPERTONIC  4 mL Nebulization Daily   Continuous Infusions: . sodium chloride 100 mL/hr at 04/14/16 1247    LOS: 3 days   Kerney Elbe, DO [facility] Pager [phone]  If 7PM-7AM, please contact night-coverage [email or web address] Password  [email or web address] 04/14/2016, 7:02 PM

## 2016-04-15 DIAGNOSIS — N183 Chronic kidney disease, stage 3 (moderate): Secondary | ICD-10-CM

## 2016-04-15 DIAGNOSIS — A419 Sepsis, unspecified organism: Principal | ICD-10-CM

## 2016-04-15 DIAGNOSIS — J441 Chronic obstructive pulmonary disease with (acute) exacerbation: Secondary | ICD-10-CM

## 2016-04-15 DIAGNOSIS — J181 Lobar pneumonia, unspecified organism: Secondary | ICD-10-CM

## 2016-04-15 LAB — COMPREHENSIVE METABOLIC PANEL
ALT: 23 U/L (ref 17–63)
AST: 20 U/L (ref 15–41)
Albumin: 3.1 g/dL — ABNORMAL LOW (ref 3.5–5.0)
Alkaline Phosphatase: 48 U/L (ref 38–126)
Anion gap: 4 — ABNORMAL LOW (ref 5–15)
BUN: 15 mg/dL (ref 6–20)
CHLORIDE: 110 mmol/L (ref 101–111)
CO2: 27 mmol/L (ref 22–32)
CREATININE: 1.03 mg/dL (ref 0.61–1.24)
Calcium: 9 mg/dL (ref 8.9–10.3)
GFR calc Af Amer: >60 mL/min (ref >60)
GFR calc non Af Amer: >60 mL/min (ref >60)
Glucose, Bld: 102 mg/dL — ABNORMAL HIGH (ref 65–99)
POTASSIUM: 3.2 mmol/L — AB (ref 3.5–5.1)
SODIUM: 141 mmol/L (ref 135–145)
Total Bilirubin: 0.5 mg/dL (ref 0.3–1.2)
Total Protein: 6.3 g/dL — ABNORMAL LOW (ref 6.5–8.1)

## 2016-04-15 LAB — CBC WITH DIFFERENTIAL/PLATELET
BASOS ABS: 0 10*3/uL (ref 0.0–0.1)
BASOS PCT: 0 %
EOS ABS: 0.1 10*3/uL (ref 0.0–0.7)
EOS PCT: 1 %
HCT: 37.1 % — ABNORMAL LOW (ref 39.0–52.0)
Hemoglobin: 12.5 g/dL — ABNORMAL LOW (ref 13.0–17.0)
Lymphocytes Relative: 25 %
Lymphs Abs: 3.4 10*3/uL (ref 0.7–4.0)
MCH: 29.1 pg (ref 26.0–34.0)
MCHC: 33.7 g/dL (ref 30.0–36.0)
MCV: 86.3 fL (ref 78.0–100.0)
MONO ABS: 1.1 10*3/uL — AB (ref 0.1–1.0)
Monocytes Relative: 8 %
Neutro Abs: 9.2 10*3/uL — ABNORMAL HIGH (ref 1.7–7.7)
Neutrophils Relative %: 66 %
Platelets: 346 10*3/uL (ref 150–400)
RBC: 4.3 MIL/uL (ref 4.22–5.81)
RDW: 13.3 % (ref 11.5–15.5)
WBC: 13.8 10*3/uL — AB (ref 4.0–10.5)

## 2016-04-15 LAB — MAGNESIUM: MAGNESIUM: 1.9 mg/dL (ref 1.7–2.4)

## 2016-04-15 LAB — PHOSPHORUS: PHOSPHORUS: 3.1 mg/dL (ref 2.5–4.6)

## 2016-04-15 MED ORDER — POTASSIUM CHLORIDE CRYS ER 20 MEQ PO TBCR
40.0000 meq | EXTENDED_RELEASE_TABLET | Freq: Once | ORAL | Status: AC
  Administered 2016-04-15: 40 meq via ORAL
  Filled 2016-04-15: qty 2

## 2016-04-15 MED ORDER — PREDNISONE 20 MG PO TABS
40.0000 mg | ORAL_TABLET | Freq: Every day | ORAL | Status: DC
  Administered 2016-04-16: 40 mg via ORAL
  Filled 2016-04-15: qty 2

## 2016-04-15 NOTE — Progress Notes (Signed)
PROGRESS NOTE    Wayne Meadows  ZOX:[id number] DOB: 09/18/1934 DOA: 04/11/2016 PCP: PROVIDER NOT IN SYSTEM   Brief Narrative:  Wayne Meadows is a 82 y.o. male with a past medical history significant for COPD not on home O2, CAD s/p PCI, hx CVA, renal Ca, CKD III baseline Cr 1.2 who presents with fever, myalgias, cough for 3 days. He was in his usual state of health until Tuesday night when he developed cough and malaise. Then since Wednesday, he has had progressive fevers, chills, myalgias, congestion, sweats, cough, sputum production, and shortness of breath with wheezing. This didn't improve much with his home albuterol, and tonight had worsened so he came to the Er. Admitted for Sepsis from CAP and found to have 1/4 Blood Cx positive for CoNS felt to be a contaminant. He has improved slowly but remains weak with exertional dyspnea. He lives alone and is at high risk for failure to thrive at home currently.   Assessment & Plan:   Principal Problem:   Sepsis, unspecified organism [facility]) Active Problems:   Coronary artery disease due to lipid rich plaque   Community acquired pneumonia of left lower lobe of lung ([place])   Essential hypertension   Centrilobular emphysema (HCC)   COPD exacerbation (HCC)   CKD (chronic kidney disease), stage III   Hyperbilirubinemia   Elevated lactic acid level   Renal insufficiency  Sepsis from community-acquired pneumonia: Sepsis resolved. Tachycardia, tachypnea, fever, leukocytosis, and evidence of organ dysfunction improved. Lactate 2.2 > 1.6. Leukocytosis improving steadily from 23 on admission. Flu negative.  - Continue ceftriaxone and azithromycin x7 days - Monitoring cultures  COPD exacerbation:  - s/p Prednisone 40 mg daily for 5 days. Will continue given he is still wheezing and likely taper as outpatient. - Continue scheduled and prn nebulizer treatments - Continue Combivent  Hypertension, CAD and CVA secondary prevention:  - Continue  Metoprolol 12.5 mg po BID, Enalapril 20 mg po Daily and Amlodipine 5 mg po Daily - Continue Pravachol 40 mg po Daily, Asiprin, Plavix 75 mg  Elevated total bilirubin: Related to sepsis. Improved.  Chronic Colitis - Continue Mesalamine  CoNS isolate in 1 of 4 blood culture bottles: Almost certainly a contaminant.  - Monitor repeat blood cultures.   Hx of Renal CA: Stable.   CKD Stage III: Patient's Cr was 1.4 on admission and improved to baseline thought to be 1.2. Not diagnostically consistent with AKI.   DVT prophylaxis: Lovenox 40 mg sq Code Status: FULL CODE Family Communication: None at bedside Disposition Plan: Home when improved, anticipate DC in 24 hours.   Consultants:   None   Procedures:  None   Antimicrobials:  Anti-infectives    Start     Dose/Rate Route Frequency Ordered Stop   04/13/16 0800  vancomycin (VANCOCIN) 1,250 mg in sodium chloride 0.9 % 250 mL IVPB  Status:  Discontinued     1,250 mg 166.7 mL/hr over 90 Minutes Intravenous Every 24 hours 04/12/16 0627 04/13/16 2054   04/12/16 0800  azithromycin (ZITHROMAX) tablet 500 mg     500 mg Oral Every 24 hours 04/11/16 0558 04/19/16 0759   04/12/16 0630  vancomycin (VANCOCIN) 1,500 mg in sodium chloride 0.9 % 500 mL IVPB     1,500 mg 250 mL/hr over 120 Minutes Intravenous  Once 04/12/16 0627 04/12/16 0849   04/12/16 0600  cefTRIAXone (ROCEPHIN) 1 g in dextrose 5 % 50 mL IVPB     1 g 100 mL/hr over 30  Minutes Intravenous Every 24 hours 04/11/16 0558 04/19/16 0559   04/11/16 0515  oseltamivir (TAMIFLU) capsule 30 mg  Status:  Discontinued     30 mg Oral 2 times daily 04/11/16 0451 04/12/16 1743   04/11/16 0445  cefTRIAXone (ROCEPHIN) 1 g in dextrose 5 % 50 mL IVPB     1 g 100 mL/hr over 30 Minutes Intravenous  Once 04/11/16 0437 04/11/16 0544   04/11/16 0445  azithromycin (ZITHROMAX) 500 mg in dextrose 5 % 250 mL IVPB     500 mg 250 mL/hr over 60 Minutes Intravenous  Once 04/11/16 0437 04/11/16 0550      Subjective: Patient endorses continued, mildly improved wheezing improved with breathing treatments. Dyspnea has improved when at rest but even with minimal exertion he reports significant dyspnea not at his baseline.   Objective: Vitals:   04/15/16 0448 04/15/16 0908 04/15/16 1153 04/15/16 1413  BP: (!) 170/63   126/75  Pulse: 75   85  Resp: 18   18  Temp: 99 F (37.2 C)   97.7 F (36.5 C)  TempSrc: Oral   Oral  SpO2: 97% 98% 100% 91%  Weight:      Height:        Intake/Output Summary (Last 24 hours) at 04/15/16 1637 Last data filed at 04/15/16 1245  Gross per 24 hour  Intake              720 ml  Output                1 ml  Net              719 ml   Filed Weights   04/11/16 0255 04/11/16 0611  Weight: 103.4 kg (228 lb) 100.1 kg (220 lb 10.9 oz)   Constitutional: WN/WD, NAD and appears calm and comfortable Eyes: Lids and conjunctivae normal, sclerae anicteric  ENMT: External Ears, Nose appear normal. Grossly normal hearing. Neck: Appears normal, supple, no cervical masses, normal ROM, no appreciable thyromegaly, no JVD Respiratory: Diffuse expiratory wheezing with normal rate and effort; No rales, rhonchi or crackles. Increased effort apparent just with standing from seated position which is NOT his baseline. Cardiovascular: RRR, no murmurs / rubs / gallops. S1 and S2 auscultated. Mild extremity edema.  Abdomen: Soft, non-tender, distended 2/2 to Abdominal Wall Herniation and weakening. No masses palpated. No appreciable hepatosplenomegaly. Bowel sounds positive x4.  Musculoskeletal: No clubbing / cyanosis of digits/nails. No joint deformity upper and lower extremities. No contractures Skin: No rashes, lesions, ulcers on limited skin evaluation. No induration; Warm and dry.  Neurologic: CN 2-12 grossly intact with no focal deficits. Romberg sign cerebellar reflexes not assessed.  Psychiatric: Normal judgment and insight. Alert and oriented x 3. Normal mood and appropriate  affect.   Data Reviewed: I have personally reviewed following labs and imaging studies  CBC:  Recent Labs Lab 04/11/16 0320 04/12/16 0534 04/13/16 0535 04/14/16 0546 04/15/16 0612  WBC 23.0* 22.2* 19.1* 14.9* 13.8*  NEUTROABS 20.0*  --  16.8* 11.7* 9.2*  HGB 13.7 12.2* 11.3* 12.2* 12.5*  HCT 39.8 35.0* 33.2* 35.7* 37.1*  MCV 89.2 88.4 87.1 89.5 86.3  PLT 299 253 311 343 545   Basic Metabolic Panel:  Recent Labs Lab 04/11/16 0320 04/12/16 0534 04/13/16 0535 04/14/16 0546 04/15/16 0612  NA 138 138 139 141 141  K 3.8 3.7 3.9 3.7 3.2*  CL 107 110 113* 112* 110  CO2 22 22 21* 22 27  GLUCOSE 145* 224*  154* 119* 102*  BUN 15 24* 24* 18 15  CREATININE 1.39* 1.40* 1.23 1.07  1.11 1.03  CALCIUM 9.1 8.7* 8.9 9.1 9.0  MG  --  1.9 2.0 2.0 1.9  PHOS  --  1.7* 2.2* 2.9 3.1   GFR: Estimated Creatinine Clearance: 73.5 mL/min (by C-G formula based on SCr of 1.03 mg/dL). Liver Function Tests:  Recent Labs Lab 04/11/16 0320 04/11/16 3299 04/12/16 0534 04/13/16 0535 04/14/16 0546 04/15/16 0612  AST 27  --  20 19 22 20   ALT 21  --  17 18 21 23   ALKPHOS 57  --  48 46 60 48  BILITOT 2.1* 1.5* 0.8 0.3 0.5 0.5  PROT 7.7  --  6.4* 6.0* 6.7 6.3*  ALBUMIN 3.8  --  3.0* 2.9* 3.1* 3.1*   Coagulation Profile:  Recent Labs Lab 04/11/16 0320  INR 1.30   Sepsis Labs:  Recent Labs Lab 04/11/16 0332 04/11/16 0619  LATICACIDVEN 2.20* 1.9    Recent Results (from the past 240 hour(s))  Culture, blood (Routine x 2)     Status: None (Preliminary result)   Collection Time: 04/11/16  2:40 AM  Result Value Ref Range Status   Specimen Description BLOOD LEFT HAND  Final   Special Requests BOTTLES DRAWN AEROBIC AND ANAEROBIC 5ML  Final   Culture   Final    NO GROWTH 4 DAYS Performed at [facility], [place] [address] [place], [place] [address]    Report Status PENDING  Incomplete  Culture, blood (Routine x 2)     Status: Abnormal   Collection Time: 04/11/16  3:20 AM    Result Value Ref Range Status   Specimen Description BLOOD RIGHT HAND  Final   Special Requests BOTTLES DRAWN AEROBIC AND ANAEROBIC 5ML  Final   Culture  Setup Time   Final    AEROBIC BOTTLE ONLY GRAM POSITIVE COCCI IN CLUSTERS CRITICAL RESULT CALLED TO, READ BACK BY AND VERIFIED WITH: J GRIMSLEY,PHARMD AT 3419 04/12/16 BY T [place]    Culture (A)  Final    STAPHYLOCOCCUS SPECIES (COAGULASE NEGATIVE) THE SIGNIFICANCE OF ISOLATING THIS ORGANISM FROM A SINGLE SET OF BLOOD CULTURES WHEN MULTIPLE SETS ARE DRAWN IS UNCERTAIN. PLEASE NOTIFY THE MICROBIOLOGY DEPARTMENT WITHIN ONE WEEK IF SPECIATION AND SENSITIVITIES ARE REQUIRED. Performed at [facility], [place] [address], [place], [place] [address]    Report Status 04/13/2016 FINAL  Final  Blood Culture ID Panel (Reflexed)     Status: Abnormal   Collection Time: 04/11/16  3:20 AM  Result Value Ref Range Status   Enterococcus species NOT DETECTED NOT DETECTED Final   Listeria monocytogenes NOT DETECTED NOT DETECTED Final   Staphylococcus species DETECTED (A) NOT DETECTED Final    Comment: Methicillin (oxacillin) resistant coagulase negative staphylococcus. Possible blood culture contaminant (unless isolated from more than one blood culture draw or clinical case suggests pathogenicity). No antibiotic treatment is indicated for blood  culture contaminants. CRITICAL RESULT CALLED TO, READ BACK BY AND VERIFIED WITH: TO JGRIMSLEY(PHARMD) BY TCLEVELAND AT 6:05AM    Staphylococcus aureus NOT DETECTED NOT DETECTED Final   Methicillin resistance DETECTED (A) NOT DETECTED Final    Comment: CRITICAL RESULT CALLED TO, READ BACK BY AND VERIFIED WITH: TO JGRIMSLEY(PHARMD) BY TCLEVELAND AT 6:05AM    Streptococcus species NOT DETECTED NOT DETECTED Final   Streptococcus agalactiae NOT DETECTED NOT DETECTED Final   Streptococcus pneumoniae NOT DETECTED NOT DETECTED Final   Streptococcus pyogenes NOT DETECTED NOT DETECTED Final  Acinetobacter  baumannii NOT DETECTED NOT DETECTED Final   Enterobacteriaceae species NOT DETECTED NOT DETECTED Final   Enterobacter cloacae complex NOT DETECTED NOT DETECTED Final   Escherichia coli NOT DETECTED NOT DETECTED Final   Klebsiella oxytoca NOT DETECTED NOT DETECTED Final   Klebsiella pneumoniae NOT DETECTED NOT DETECTED Final   Proteus species NOT DETECTED NOT DETECTED Final   Serratia marcescens NOT DETECTED NOT DETECTED Final   Haemophilus influenzae NOT DETECTED NOT DETECTED Final   Neisseria meningitidis NOT DETECTED NOT DETECTED Final   Pseudomonas aeruginosa NOT DETECTED NOT DETECTED Final   Candida albicans NOT DETECTED NOT DETECTED Final   Candida glabrata NOT DETECTED NOT DETECTED Final   Candida krusei NOT DETECTED NOT DETECTED Final   Candida parapsilosis NOT DETECTED NOT DETECTED Final   Candida tropicalis NOT DETECTED NOT DETECTED Final    Comment: Performed at [facility], [place] [address], [place], [place] [address]  Respiratory Panel by PCR     Status: None   Collection Time: 04/11/16  3:35 PM  Result Value Ref Range Status   Adenovirus NOT DETECTED NOT DETECTED Final   Coronavirus 229E NOT DETECTED NOT DETECTED Final   Coronavirus HKU1 NOT DETECTED NOT DETECTED Final   Coronavirus NL63 NOT DETECTED NOT DETECTED Final   Coronavirus OC43 NOT DETECTED NOT DETECTED Final   Metapneumovirus NOT DETECTED NOT DETECTED Final   Rhinovirus / Enterovirus NOT DETECTED NOT DETECTED Final   Influenza A NOT DETECTED NOT DETECTED Final   Influenza B NOT DETECTED NOT DETECTED Final   Parainfluenza Virus 1 NOT DETECTED NOT DETECTED Final   Parainfluenza Virus 2 NOT DETECTED NOT DETECTED Final   Parainfluenza Virus 3 NOT DETECTED NOT DETECTED Final   Parainfluenza Virus 4 NOT DETECTED NOT DETECTED Final   Respiratory Syncytial Virus NOT DETECTED NOT DETECTED Final   Bordetella pertussis NOT DETECTED NOT DETECTED Final   Chlamydophila pneumoniae NOT DETECTED NOT DETECTED Final    Mycoplasma pneumoniae NOT DETECTED NOT DETECTED Final  Urine culture     Status: Abnormal   Collection Time: 04/11/16  7:20 PM  Result Value Ref Range Status   Specimen Description URINE, RANDOM  Final   Special Requests NONE  Final   Culture (A)  Final    <10,000 COLONIES/mL INSIGNIFICANT GROWTH Performed at [facility], [address], [place], [place] [address]    Report Status 04/13/2016 FINAL  Final  Culture, blood (Routine X 2) w Reflex to ID Panel     Status: None (Preliminary result)   Collection Time: 04/13/16  9:51 PM  Result Value Ref Range Status   Specimen Description BLOOD LEFT ARM  Final   Special Requests BOTTLES DRAWN AEROBIC AND ANAEROBIC 5 CC  Final   Culture   Final    NO GROWTH 2 DAYS Performed at [facility], [place] [address], [place], [place] [address]    Report Status PENDING  Incomplete  Culture, blood (Routine X 2) w Reflex to ID Panel     Status: None (Preliminary result)   Collection Time: 04/13/16  9:51 PM  Result Value Ref Range Status   Specimen Description BLOOD LEFT HAND  Final   Special Requests IN PEDIATRIC BOTTLE 3 CC  Final   Culture   Final    NO GROWTH 2 DAYS Performed at [facility], [place] [address], [place], [place] [address]    Report Status PENDING  Incomplete    Radiology Studies: No results  found. Scheduled Meds: . amLODipine  5 mg Oral Daily  . aspirin EC  81 mg Oral Daily  . azithromycin  500 mg Oral Q24H  . baclofen  10 mg Oral QHS  . calcium-vitamin D  1 tablet Oral Q breakfast  . cefTRIAXone (ROCEPHIN)  IV  1 g Intravenous Q24H  . clopidogrel  75 mg Oral Daily  . dextromethorphan-guaiFENesin  1 tablet Oral BID  . enalapril  20 mg Oral QHS  . enoxaparin (LOVENOX) injection  40 mg Subcutaneous Q24H  . gabapentin  300 mg Oral 2 times per day  . gabapentin  600 mg Oral QHS  . ipratropium-albuterol  3 mL Nebulization QID  . Mesalamine  1,600 mg Oral Daily  . metoprolol tartrate  12.5 mg Oral BID  .  multivitamin with minerals  1 tablet Oral Daily  . pantoprazole  20 mg Oral BID AC  . pravastatin  40 mg Oral QHS  . sodium chloride HYPERTONIC  4 mL Nebulization Daily     LOS: 4 days   Vance Gather, MD [facility] Pager [phone]   If 7PM-7AM, please contact night-coverage [email or web address] Password [facility] 04/15/2016, 4:37 PM

## 2016-04-16 LAB — BASIC METABOLIC PANEL
ANION GAP: 8 (ref 5–15)
BUN: 19 mg/dL (ref 6–20)
CHLORIDE: 107 mmol/L (ref 101–111)
CO2: 24 mmol/L (ref 22–32)
Calcium: 8.9 mg/dL (ref 8.9–10.3)
Creatinine, Ser: 1.1 mg/dL (ref 0.61–1.24)
GFR calc Af Amer: >60 mL/min (ref >60)
GFR calc non Af Amer: >60 mL/min (ref >60)
GLUCOSE: 102 mg/dL — AB (ref 65–99)
POTASSIUM: 3.3 mmol/L — AB (ref 3.5–5.1)
Sodium: 139 mmol/L (ref 135–145)

## 2016-04-16 LAB — CBC
HCT: 37.4 % — ABNORMAL LOW (ref 39.0–52.0)
HEMOGLOBIN: 12.9 g/dL — AB (ref 13.0–17.0)
MCH: 29.6 pg (ref 26.0–34.0)
MCHC: 34.5 g/dL (ref 30.0–36.0)
MCV: 85.8 fL (ref 78.0–100.0)
Platelets: 371 10*3/uL (ref 150–400)
RBC: 4.36 MIL/uL (ref 4.22–5.81)
RDW: 13.1 % (ref 11.5–15.5)
WBC: 14.4 10*3/uL — ABNORMAL HIGH (ref 4.0–10.5)

## 2016-04-16 LAB — CULTURE, BLOOD (ROUTINE X 2): Culture: NO GROWTH

## 2016-04-16 MED ORDER — LEVOFLOXACIN 500 MG PO TABS: 500.0000 mg | ORAL_TABLET | Freq: Every day | ORAL | 0 refills | Status: DC

## 2016-04-16 MED ORDER — PREDNISONE 20 MG PO TABS: ORAL_TABLET | ORAL | 0 refills | Status: DC

## 2016-04-16 NOTE — Care Management Important Message (Signed)
Important Message  Patient Details  Name: Wayne Meadows MRN: [id number] Date of Birth: 16-Jun-1934   Medicare Important Message Given:  Yes    Kerin Salen 04/16/2016, 10:39 AMImportant Message  Patient Details  Name: Wayne Meadows MRN: [id number] Date of Birth: [birth year]   Medicare Important Message Given:  Yes    Kerin Salen 04/16/2016, 10:39 AM

## 2016-04-16 NOTE — Discharge Summary (Signed)
Physician Discharge Summary  RAYDEL HOSICK WFU:[id number] DOB: 27-Feb-1934 DOA: 04/11/2016  PCP: PROVIDER NOT IN SYSTEM  Admit date: 04/11/2016 Discharge date: 04/16/2016  Admitted From: Home Disposition: Home   Recommendations for Outpatient Follow-up:  1. Follow up with PCP in 1-2 weeks 2. Please obtain BMP/CBC in one week  Home Health: None recommended by PT Equipment/Devices: None recommended Discharge Condition: Stable CODE STATUS: Full Diet recommendation: Heart healthy  Brief/Interim Summary: Drayce Tawil Gordonis a 82 y.o.malewith a past medical history significant for COPD not on home O2, CAD s/p PCI, hx CVA, renal Ca, CKD III baseline Cr 1.2 who presented with fever, myalgias, cough for 3 days. CXR showed infiltrate and he was admitted for sepsis due to CAP. He was also treated for COPD exacerbation with slow improvement. 1 of 2 blood cultures was positive for CoNS felt to be a contaminant with negative repeat cultures. He has improved slowly and was able to ambulate in the halls without significant dyspnea prior to discharge. PT evaluated the patient and had no recommendations for physical therapy or equipment. He is discharged to continue prednisone taper and levaquin.   Discharge Diagnoses:  Principal Problem:   Sepsis, unspecified organism [facility]) Active Problems:   Coronary artery disease due to lipid rich plaque   Community acquired pneumonia of left lower lobe of lung ([place])   Essential hypertension   Centrilobular emphysema (HCC)   COPD exacerbation (HCC)   CKD (chronic kidney disease), stage III   Hyperbilirubinemia   Elevated lactic acid level   Renal insufficiency  Sepsis from community-acquired pneumonia: Sepsis resolved. Tachycardia, tachypnea, fever, leukocytosis, and evidence of organ dysfunction improved. Lactate 2.2 > 1.6. Leukocytosis improving steadily from 23 on admission. Flu negative.  - Change ceftriaxone and azithromycin to levaquin. - Monitoring  cultures  COPD exacerbation: - s/p Prednisone 40 mg daily for 5 days. Will continue given he is still wheezing and likely taper as outpatient. - Continue scheduled and prn nebulizer treatments - Continue Combivent  Hypertension, CAD and CVA secondary prevention: - Continue Metoprolol 12.5 mg po BID, Enalapril 20 mg po Daily and Amlodipine 5 mg po Daily - Continue Pravachol 40 mg po Daily, Asiprin, Plavix 75 mg  Elevated total bilirubin:Related to sepsis. Improved.  Chronic Colitis - Continue Mesalamine  CoNS isolate in 1 of 4 blood culture bottles: Almost certainly a contaminant.  - Monitor repeat blood cultures. Negative at 3 days at discharge.   Hx of Renal CA: Stable.   CKD Stage III: Patient's Cr was 1.4 on admission and improved to baseline thought to be 1.2. Not diagnostically consistent with AKI.   Discharge Instructions Discharge Instructions    Discharge instructions    Complete by:  As directed    You were admitted with a COPD exacerbation and pneumonia and have slowly improved. You are now stable for discharge with the following recommendations:  - Take prednisone as directed for the next 4 days (2 tablets daily starting tomorrow morning, follwed by 1 tablet every morning x 2 more days) - Take levaquin (antibiotic) once daily for 4 days starting tomorrow.  - Continue inhaler medications at home  - Follow up with your primary care doctor in the next 1 - 2 weeks. - If you symptoms return, seek medical care right away.     Allergies as of 04/16/2016      Reactions   Bee Venom Anaphylaxis   Has epi pen   Influenza Vaccines Other (See Comments)   "Mortally sick for  2 weeks"      Medication List    TAKE these medications   albuterol 108 (90 Base) MCG/ACT inhaler Commonly known as:  PROVENTIL HFA;VENTOLIN HFA Inhale 1-2 puffs into the lungs every 6 (six) hours as needed for wheezing or shortness of breath.   amLODipine 5 MG tablet Commonly known as:   NORVASC Take 5 mg by mouth daily.   ARTIFICIAL TEAR OP Place 1 drop into both eyes 2 (two) times daily as needed (dry eyes).   aspirin EC 81 MG tablet Take 81 mg by mouth daily.   baclofen 10 MG tablet Commonly known as:  LIORESAL Take 10 mg by mouth at bedtime.   calcium-vitamin D 500-200 MG-UNIT tablet Commonly known as:  OSCAL WITH D Take 1 tablet by mouth daily with breakfast.   clopidogrel 75 MG tablet Commonly known as:  PLAVIX Take 75 mg by mouth daily.   enalapril 20 MG tablet Commonly known as:  VASOTEC Take 20 mg by mouth at bedtime.   EPINEPHrine 0.3 mg/0.3 mL Devi Commonly known as:  EPI-PEN Inject 0.3 mg into the muscle once.   Fish Oil 1200 MG Caps Take 1 capsule by mouth daily.   gabapentin 300 MG capsule Commonly known as:  NEURONTIN Take 300-600 mg by mouth 3 (three) times daily. Takes one capsule in the morning and one at noon, then takes two capsules at bedtime.   HYDROcodone-acetaminophen 10-325 MG tablet Commonly known as:  NORCO Take 1 tablet by mouth 2 (two) times daily as needed for moderate pain.   Ipratropium-Albuterol 20-100 MCG/ACT Aers respimat Commonly known as:  COMBIVENT Inhale 1 puff into the lungs 4 (four) times daily.   levofloxacin 500 MG tablet Commonly known as:  LEVAQUIN Take 1 tablet (500 mg total) by mouth daily. Start taking on:  04/17/2016   MESALAMINE PO Take 1,500 mg by mouth daily.   metoprolol tartrate 25 MG tablet Commonly known as:  LOPRESSOR Take 12.5 mg by mouth 2 (two) times daily.   mometasone 220 MCG/INH inhaler Commonly known as:  ASMANEX Inhale 1 puff into the lungs at bedtime.   multivitamin tablet Take 1 tablet by mouth daily.   nitroGLYCERIN 0.4 MG SL tablet Commonly known as:  NITROSTAT Place 0.4 mg under the tongue every 5 (five) minutes as needed for chest pain.   pantoprazole 20 MG tablet Commonly known as:  PROTONIX Take 1 tablet (20 mg total) by mouth 2 (two) times daily before a  meal.   polyethylene glycol packet Commonly known as:  MIRALAX / GLYCOLAX Take 17 g by mouth daily. What changed:  when to take this  reasons to take this   pravastatin 40 MG tablet Commonly known as:  PRAVACHOL Take 40 mg by mouth at bedtime.   predniSONE 20 MG tablet Commonly known as:  DELTASONE 2 tabs po daily x2 days, then 1 tab po daily x2 days      Follow-up Information    Primary care provider. Schedule an appointment as soon as possible for a visit in 1 week(s).          Allergies  Allergen Reactions  . Bee Venom Anaphylaxis    Has epi pen  . Influenza Vaccines Other (See Comments)    "Mortally sick for 2 weeks"    Consultations:  None  Procedures/Studies: Dg Chest 2 View  Result Date: 04/12/2016 CLINICAL DATA:  82 year old male with history of congestion. Follow-up for prior history of pneumonia. EXAM: CHEST  2 VIEW  COMPARISON:  Chest x-ray 04/11/2016. FINDINGS: Lungs remain hyperexpanded with flattening of the hemidiaphragms, increased retrosternal airspace, and mild emphysematous changes, suggestive of underlying COPD. Chronic scarring in the right lung base is unchanged. Ill-defined patchy opacity and interstitial prominence in the left base remains concerning for bronchopneumonia in the left lower lobe. No definite pleural effusions (although there is chronic right basilar pleural scarring). No evidence of pulmonary edema. Heart size is normal. Upper mediastinal contours are within normal limits. Aortic atherosclerosis. IMPRESSION: 1. Findings remain concerning for left lower lobe bronchopneumonia. 2. Chronic changes suggestive of underlying COPD redemonstrated, as above. 3. Aortic atherosclerosis. Electronically Signed   By: Vinnie Langton M.D.   On: 04/12/2016 16:30   Dg Chest 2 View  Result Date: 04/11/2016 CLINICAL DATA:  Initial evaluation for acute shortness of breath. EXAM: CHEST  2 VIEW COMPARISON:  Prior radiograph from 11/14/2015. FINDINGS:  Cardiac and mediastinal silhouettes are within normal limits. Aortic atherosclerosis. Lungs are hyperinflated with underlying emphysematous changes. Fever. Hazy opacity within the left lower lobe, concerning for pneumonia. Right lung clear. No pulmonary edema or pleural effusion. No pneumothorax. No acute osseous abnormality. IMPRESSION: 1. Patchy and hazy left basilar opacity, concerning for pneumonia. 2. Emphysema. 3. Aortic atherosclerosis. Electronically Signed   By: Jeannine Boga M.D.   On: 04/11/2016 03:54   Subjective: Patient seen ambulating in the halls not short of breath. Minimal wheezing resolved with breathing treatments. No chest pain. Eating well, wants to go home.   Discharge Exam: Vitals:   04/15/16 2022 04/16/16 0400  BP: (!) 166/81 (!) 146/68  Pulse: 75 81  Resp: 18 17  Temp: 98.7 F (37.1 C) 98.5 F (36.9 C)   General: Pt is alert, awake, not in acute distress Cardiovascular: RRR, S1/S2 +, no rubs, no gallops Respiratory: Nonlabored on room air while ambulating. Scant end-expiratory wheezing without prolongation. Abdominal: Soft, NT, ND, bowel sounds + Extremities: No edema, no cyanosis  The results of significant diagnostics from this hospitalization (including imaging, microbiology, ancillary and laboratory) are listed below for reference.    Labs: BNP (last 3 results)  Recent Labs  11/14/15 2340  BNP 70.9   Basic Metabolic Panel:  Recent Labs Lab 04/12/16 0534 04/13/16 0535 04/14/16 0546 04/15/16 0612 04/16/16 0547  NA 138 139 141 141 139  K 3.7 3.9 3.7 3.2* 3.3*  CL 110 113* 112* 110 107  CO2 22 21* 22 27 24   GLUCOSE 224* 154* 119* 102* 102*  BUN 24* 24* 18 15 19   CREATININE 1.40* 1.23 1.07  1.11 1.03 1.10  CALCIUM 8.7* 8.9 9.1 9.0 8.9  MG 1.9 2.0 2.0 1.9  --   PHOS 1.7* 2.2* 2.9 3.1  --    Liver Function Tests:  Recent Labs Lab 04/11/16 0320 04/11/16 0619 04/12/16 0534 04/13/16 0535 04/14/16 0546 04/15/16 0612  AST 27  --   20 19 22 20   ALT 21  --  17 18 21 23   ALKPHOS 57  --  48 46 60 48  BILITOT 2.1* 1.5* 0.8 0.3 0.5 0.5  PROT 7.7  --  6.4* 6.0* 6.7 6.3*  ALBUMIN 3.8  --  3.0* 2.9* 3.1* 3.1*   CBC:  Recent Labs Lab 04/11/16 0320 04/12/16 0534 04/13/16 0535 04/14/16 0546 04/15/16 0612 04/16/16 0547  WBC 23.0* 22.2* 19.1* 14.9* 13.8* 14.4*  NEUTROABS 20.0*  --  16.8* 11.7* 9.2*  --   HGB 13.7 12.2* 11.3* 12.2* 12.5* 12.9*  HCT 39.8 35.0* 33.2* 35.7* 37.1* 37.4*  MCV 89.2 88.4 87.1 89.5 86.3 85.8  PLT 299 253 311 343 346 371   Urinalysis    Component Value Date/Time   COLORURINE YELLOW 04/11/2016 1920   APPEARANCEUR HAZY (A) 04/11/2016 1920   LABSPEC 1.027 04/11/2016 1920   PHURINE 5.0 04/11/2016 1920   GLUCOSEU >=500 (A) 04/11/2016 1920   HGBUR SMALL (A) 04/11/2016 1920   BILIRUBINUR NEGATIVE 04/11/2016 1920   KETONESUR 5 (A) 04/11/2016 1920   PROTEINUR >=300 (A) 04/11/2016 1920   NITRITE NEGATIVE 04/11/2016 1920   LEUKOCYTESUR NEGATIVE 04/11/2016 1920    Microbiology Recent Results (from the past 240 hour(s))  Culture, blood (Routine x 2)     Status: None   Collection Time: 04/11/16  2:40 AM  Result Value Ref Range Status   Specimen Description BLOOD LEFT HAND  Final   Special Requests BOTTLES DRAWN AEROBIC AND ANAEROBIC 5ML  Final   Culture   Final    NO GROWTH 5 DAYS Performed at [facility], [place] [address], [place], [place] [address]    Report Status 04/16/2016 FINAL  Final  Culture, blood (Routine x 2)     Status: Abnormal   Collection Time: 04/11/16  3:20 AM  Result Value Ref Range Status   Specimen Description BLOOD RIGHT HAND  Final   Special Requests BOTTLES DRAWN AEROBIC AND ANAEROBIC 5ML  Final   Culture  Setup Time   Final    AEROBIC BOTTLE ONLY GRAM POSITIVE COCCI IN CLUSTERS CRITICAL RESULT CALLED TO, READ BACK BY AND VERIFIED WITH: J GRIMSLEY,PHARMD AT 9381 04/12/16 BY T [place]    Culture (A)  Final    STAPHYLOCOCCUS SPECIES (COAGULASE NEGATIVE) THE  SIGNIFICANCE OF ISOLATING THIS ORGANISM FROM A SINGLE SET OF BLOOD CULTURES WHEN MULTIPLE SETS ARE DRAWN IS UNCERTAIN. PLEASE NOTIFY THE MICROBIOLOGY DEPARTMENT WITHIN ONE WEEK IF SPECIATION AND SENSITIVITIES ARE REQUIRED. Performed at [facility], [place] [address], [place], [place] [address]    Report Status 04/13/2016 FINAL  Final  Blood Culture ID Panel (Reflexed)     Status: Abnormal   Collection Time: 04/11/16  3:20 AM  Result Value Ref Range Status   Enterococcus species NOT DETECTED NOT DETECTED Final   Listeria monocytogenes NOT DETECTED NOT DETECTED Final   Staphylococcus species DETECTED (A) NOT DETECTED Final    Comment: Methicillin (oxacillin) resistant coagulase negative staphylococcus. Possible blood culture contaminant (unless isolated from more than one blood culture draw or clinical case suggests pathogenicity). No antibiotic treatment is indicated for blood  culture contaminants. CRITICAL RESULT CALLED TO, READ BACK BY AND VERIFIED WITH: TO JGRIMSLEY(PHARMD) BY TCLEVELAND AT 6:05AM    Staphylococcus aureus NOT DETECTED NOT DETECTED Final   Methicillin resistance DETECTED (A) NOT DETECTED Final    Comment: CRITICAL RESULT CALLED TO, READ BACK BY AND VERIFIED WITH: TO JGRIMSLEY(PHARMD) BY TCLEVELAND AT 6:05AM    Streptococcus species NOT DETECTED NOT DETECTED Final   Streptococcus agalactiae NOT DETECTED NOT DETECTED Final   Streptococcus pneumoniae NOT DETECTED NOT DETECTED Final   Streptococcus pyogenes NOT DETECTED NOT DETECTED Final   Acinetobacter baumannii NOT DETECTED NOT DETECTED Final   Enterobacteriaceae species NOT DETECTED NOT DETECTED Final   Enterobacter cloacae complex NOT DETECTED NOT DETECTED Final   Escherichia coli NOT DETECTED NOT DETECTED Final   Klebsiella oxytoca NOT DETECTED NOT DETECTED Final   Klebsiella pneumoniae NOT DETECTED NOT DETECTED Final   Proteus species NOT DETECTED NOT DETECTED Final   Serratia marcescens NOT DETECTED NOT  DETECTED  Final   Haemophilus influenzae NOT DETECTED NOT DETECTED Final   Neisseria meningitidis NOT DETECTED NOT DETECTED Final   Pseudomonas aeruginosa NOT DETECTED NOT DETECTED Final   Candida albicans NOT DETECTED NOT DETECTED Final   Candida glabrata NOT DETECTED NOT DETECTED Final   Candida krusei NOT DETECTED NOT DETECTED Final   Candida parapsilosis NOT DETECTED NOT DETECTED Final   Candida tropicalis NOT DETECTED NOT DETECTED Final    Comment: Performed at [facility], [place] [address] [place], [place] [address]  Respiratory Panel by PCR     Status: None   Collection Time: 04/11/16  3:35 PM  Result Value Ref Range Status   Adenovirus NOT DETECTED NOT DETECTED Final   Coronavirus 229E NOT DETECTED NOT DETECTED Final   Coronavirus HKU1 NOT DETECTED NOT DETECTED Final   Coronavirus NL63 NOT DETECTED NOT DETECTED Final   Coronavirus OC43 NOT DETECTED NOT DETECTED Final   Metapneumovirus NOT DETECTED NOT DETECTED Final   Rhinovirus / Enterovirus NOT DETECTED NOT DETECTED Final   Influenza A NOT DETECTED NOT DETECTED Final   Influenza B NOT DETECTED NOT DETECTED Final   Parainfluenza Virus 1 NOT DETECTED NOT DETECTED Final   Parainfluenza Virus 2 NOT DETECTED NOT DETECTED Final   Parainfluenza Virus 3 NOT DETECTED NOT DETECTED Final   Parainfluenza Virus 4 NOT DETECTED NOT DETECTED Final   Respiratory Syncytial Virus NOT DETECTED NOT DETECTED Final   Bordetella pertussis NOT DETECTED NOT DETECTED Final   Chlamydophila pneumoniae NOT DETECTED NOT DETECTED Final   Mycoplasma pneumoniae NOT DETECTED NOT DETECTED Final  Urine culture     Status: Abnormal   Collection Time: 04/11/16  7:20 PM  Result Value Ref Range Status   Specimen Description URINE, RANDOM  Final   Special Requests NONE  Final   Culture (A)  Final    <10,000 COLONIES/mL INSIGNIFICANT GROWTH Performed at [facility], [address] [place], [place] [address]    Report Status 04/13/2016 FINAL  Final   Culture, blood (Routine X 2) w Reflex to ID Panel     Status: None (Preliminary result)   Collection Time: 04/13/16  9:51 PM  Result Value Ref Range Status   Specimen Description BLOOD LEFT ARM  Final   Special Requests BOTTLES DRAWN AEROBIC AND ANAEROBIC 5 CC  Final   Culture   Final    NO GROWTH 3 DAYS Performed at [facility], [place] [address], [place], [place] [address]    Report Status PENDING  Incomplete  Culture, blood (Routine X 2) w Reflex to ID Panel     Status: None (Preliminary result)   Collection Time: 04/13/16  9:51 PM  Result Value Ref Range Status   Specimen Description BLOOD LEFT HAND  Final   Special Requests IN PEDIATRIC BOTTLE 3 CC  Final   Culture   Final    NO GROWTH 3 DAYS Performed at [facility], [place] [address] [place], [place] [address]    Report Status PENDING  Incomplete    Time coordinating discharge: Approximately 40 minutes  Vance Gather, MD  [facility] 04/16/2016, 4:37 PM Pager [phone]

## 2016-04-16 NOTE — Evaluation (Signed)
Physical Therapy Evaluation Patient Details Name: Wayne Meadows MRN: [id number] DOB: [birth year] Today's Date: 04/16/2016   History of Present Illness  Pt admitted with fever and SOB and dx with sepsis and CAP.  Pt with hx of CVA, CAD, COPD,   Clinical Impression  Pt admitted as above and presenting with functional mobility limitations 2* decreased endurance with mild SOB.  Pt plans dc home this date with intermittent family assist.    Follow Up Recommendations No PT follow up    Equipment Recommendations  None recommended by PT    Recommendations for Other Services       Precautions / Restrictions Restrictions Weight Bearing Restrictions: No      Mobility  Bed Mobility Overal bed mobility: Modified Independent             General bed mobility comments: Pt unassisted to EOB  Transfers Overall transfer level: Modified independent   Transfers: Sit to/from Stand Sit to Stand: Supervision;Modified independent (Device/Increase time)         General transfer comment: min cues for safety  Ambulation/Gait Ambulation/Gait assistance: Min guard;Independent Ambulation Distance (Feet): 200 Feet Assistive device: None Gait Pattern/deviations: Step-through pattern;Decreased step length - right;Decreased step length - left;Shuffle;Wide base of support Gait velocity: decr Gait velocity interpretation: Below normal speed for age/gender General Gait Details: Pt ambulating at decreased speed vs normal and with reported mild SOB..  Pt demonstrating good stability and safety awareness..  Stairs            Wheelchair Mobility    Modified Rankin (Stroke Patients Only)       Balance Overall balance assessment: No apparent balance deficits (not formally assessed)                                           Pertinent Vitals/Pain Pain Assessment: 0-10 Pain Score: 4  Pain Location: back pain Pain Descriptors / Indicators: Aching Pain  Intervention(s): Limited activity within patient's tolerance;Monitored during session    Home Living Family/patient expects to be discharged to:: Private residence Living Arrangements: Alone Available Help at Discharge: Available PRN/intermittently Type of Home: House Home Access: Stairs to enter Entrance Stairs-Rails: Right Entrance Stairs-Number of Steps: 2 Home Layout: One level Home Equipment: None      Prior Function Level of Independence: Independent               Hand Dominance        Extremity/Trunk Assessment   Upper Extremity Assessment Upper Extremity Assessment: Generalized weakness    Lower Extremity Assessment Lower Extremity Assessment: Generalized weakness       Communication   Communication: No difficulties  Cognition Arousal/Alertness: Awake/alert Behavior During Therapy: WFL for tasks assessed/performed Overall Cognitive Status: Within Functional Limits for tasks assessed                      General Comments      Exercises     Assessment/Plan    PT Assessment Patent does not need any further PT services  PT Problem List         PT Treatment Interventions      PT Goals (Current goals can be found in the Care Plan section)  Acute Rehab PT Goals Patient Stated Goal: Home     Frequency     Barriers to discharge  Co-evaluation               End of Session Equipment Utilized During Treatment: Gait belt Activity Tolerance: Patient tolerated treatment well Patient left: in bed;with call bell/phone within reach Nurse Communication: Mobility status PT Visit Diagnosis: Difficulty in walking, not elsewhere classified (R26.2)         Time: 1145-1200 PT Time Calculation (min) (ACUTE ONLY): 15 min   Charges:   PT Evaluation $PT Eval Low Complexity: 1 Procedure     PT G Codes:         Anandi Abramo 04/16/2016, 4:16 PM

## 2016-04-18 LAB — CULTURE, BLOOD (ROUTINE X 2)
CULTURE: NO GROWTH
CULTURE: NO GROWTH

## 2016-07-07 ENCOUNTER — Emergency Department (HOSPITAL_COMMUNITY)
Admission: EM | Admit: 2016-07-07 | Discharge: 2016-07-08 | Disposition: A | Discharge status: Home / Self Care | Payer: Medicare Other | Attending: Emergency Medicine | Admitting: Emergency Medicine

## 2016-07-07 DIAGNOSIS — Z87891 Personal history of nicotine dependence: Secondary | ICD-10-CM | POA: Insufficient documentation

## 2016-07-07 DIAGNOSIS — I2510 Atherosclerotic heart disease of native coronary artery without angina pectoris: Secondary | ICD-10-CM | POA: Insufficient documentation

## 2016-07-07 DIAGNOSIS — Z79899 Other long term (current) drug therapy: Secondary | ICD-10-CM | POA: Diagnosis not present

## 2016-07-07 DIAGNOSIS — Z8673 Personal history of transient ischemic attack (TIA), and cerebral infarction without residual deficits: Secondary | ICD-10-CM | POA: Insufficient documentation

## 2016-07-07 DIAGNOSIS — I129 Hypertensive chronic kidney disease with stage 1 through stage 4 chronic kidney disease, or unspecified chronic kidney disease: Secondary | ICD-10-CM | POA: Diagnosis not present

## 2016-07-07 DIAGNOSIS — Z0389 Encounter for observation for other suspected diseases and conditions ruled out: Secondary | ICD-10-CM | POA: Diagnosis not present

## 2016-07-07 DIAGNOSIS — N183 Chronic kidney disease, stage 3 (moderate): Secondary | ICD-10-CM | POA: Insufficient documentation

## 2016-07-07 DIAGNOSIS — J441 Chronic obstructive pulmonary disease with (acute) exacerbation: Secondary | ICD-10-CM | POA: Insufficient documentation

## 2016-07-07 DIAGNOSIS — Z85528 Personal history of other malignant neoplasm of kidney: Secondary | ICD-10-CM | POA: Diagnosis not present

## 2016-07-07 DIAGNOSIS — Z7982 Long term (current) use of aspirin: Secondary | ICD-10-CM | POA: Diagnosis not present

## 2016-07-07 DIAGNOSIS — R0602 Shortness of breath: Secondary | ICD-10-CM | POA: Diagnosis present

## 2016-07-07 DIAGNOSIS — R069 Unspecified abnormalities of breathing: Secondary | ICD-10-CM | POA: Diagnosis not present

## 2016-07-07 NOTE — ED Notes (Addendum)
Per EMS- Pt c/o SOB over past couple days, increasingly worse. Diminished breath sounds. Also c/o bilateral leg swelling

## 2016-07-07 NOTE — ED Notes (Signed)
Bed: WA13 Expected date:  Expected time:  Means of arrival:  Comments: EMS 

## 2016-07-08 ENCOUNTER — Other Ambulatory Visit: Payer: Self-pay

## 2016-07-08 ENCOUNTER — Encounter (HOSPITAL_COMMUNITY): Payer: Self-pay

## 2016-07-08 ENCOUNTER — Emergency Department (HOSPITAL_COMMUNITY): Payer: Medicare Other

## 2016-07-08 LAB — CBC WITH DIFFERENTIAL/PLATELET
BASOS PCT: 0 %
Basophils Absolute: 0 10*3/uL (ref 0.0–0.1)
Eosinophils Absolute: 0.2 10*3/uL (ref 0.0–0.7)
Eosinophils Relative: 2 %
HEMATOCRIT: 38.1 % — AB (ref 39.0–52.0)
HEMOGLOBIN: 12.9 g/dL — AB (ref 13.0–17.0)
Lymphocytes Relative: 18 %
Lymphs Abs: 2.1 10*3/uL (ref 0.7–4.0)
MCH: 31 pg (ref 26.0–34.0)
MCHC: 33.9 g/dL (ref 30.0–36.0)
MCV: 91.6 fL (ref 78.0–100.0)
Monocytes Absolute: 1.1 10*3/uL — ABNORMAL HIGH (ref 0.1–1.0)
Monocytes Relative: 9 %
NEUTROS ABS: 8.6 10*3/uL — AB (ref 1.7–7.7)
NEUTROS PCT: 71 %
Platelets: 326 10*3/uL (ref 150–400)
RBC: 4.16 MIL/uL — AB (ref 4.22–5.81)
RDW: 13.6 % (ref 11.5–15.5)
WBC: 12 10*3/uL — AB (ref 4.0–10.5)

## 2016-07-08 LAB — COMPREHENSIVE METABOLIC PANEL
ALBUMIN: 3.5 g/dL (ref 3.5–5.0)
ALT: 19 U/L (ref 17–63)
AST: 22 U/L (ref 15–41)
Alkaline Phosphatase: 57 U/L (ref 38–126)
Anion gap: 7 (ref 5–15)
BILIRUBIN TOTAL: 0.6 mg/dL (ref 0.3–1.2)
BUN: 14 mg/dL (ref 6–20)
CO2: 26 mmol/L (ref 22–32)
Calcium: 8.9 mg/dL (ref 8.9–10.3)
Chloride: 104 mmol/L (ref 101–111)
Creatinine, Ser: 1.32 mg/dL — ABNORMAL HIGH (ref 0.61–1.24)
GFR calc Af Amer: 58 mL/min — ABNORMAL LOW (ref >60)
GFR calc non Af Amer: 50 mL/min — ABNORMAL LOW (ref >60)
GLUCOSE: 119 mg/dL — AB (ref 65–99)
POTASSIUM: 3.8 mmol/L (ref 3.5–5.1)
Sodium: 137 mmol/L (ref 135–145)
TOTAL PROTEIN: 6.7 g/dL (ref 6.5–8.1)

## 2016-07-08 LAB — TROPONIN I: Troponin I: <0.03 ng/mL (ref <0.03)

## 2016-07-08 LAB — BRAIN NATRIURETIC PEPTIDE: B Natriuretic Peptide: 42 pg/mL (ref 0.0–100.0)

## 2016-07-08 MED ORDER — IOPAMIDOL (ISOVUE-370) INJECTION 76%
INTRAVENOUS | Status: AC
  Administered 2016-07-08: 100 mL via INTRAVENOUS
  Filled 2016-07-08: qty 100

## 2016-07-08 MED ORDER — IPRATROPIUM BROMIDE 0.02 % IN SOLN
0.5000 mg | Freq: Once | RESPIRATORY_TRACT | Status: AC
  Administered 2016-07-08: 0.5 mg via RESPIRATORY_TRACT
  Filled 2016-07-08: qty 2.5

## 2016-07-08 MED ORDER — METHYLPREDNISOLONE SODIUM SUCC 125 MG IJ SOLR
125.0000 mg | Freq: Once | INTRAMUSCULAR | Status: AC
  Administered 2016-07-08: 125 mg via INTRAVENOUS
  Filled 2016-07-08: qty 2

## 2016-07-08 MED ORDER — AZITHROMYCIN 250 MG PO TABS: 250.0000 mg | ORAL_TABLET | Freq: Every day | ORAL | 0 refills | Status: DC

## 2016-07-08 MED ORDER — ALBUTEROL (5 MG/ML) CONTINUOUS INHALATION SOLN
10.0000 mg/h | INHALATION_SOLUTION | Freq: Once | RESPIRATORY_TRACT | Status: AC
  Administered 2016-07-08: 10 mg/h via RESPIRATORY_TRACT
  Filled 2016-07-08: qty 20

## 2016-07-08 MED ORDER — ALBUTEROL SULFATE (2.5 MG/3ML) 0.083% IN NEBU
5.0000 mg | INHALATION_SOLUTION | Freq: Once | RESPIRATORY_TRACT | Status: AC
  Administered 2016-07-08: 5 mg via RESPIRATORY_TRACT
  Filled 2016-07-08: qty 6

## 2016-07-08 MED ORDER — PREDNISONE 20 MG PO TABS: ORAL_TABLET | ORAL | 0 refills | Status: DC

## 2016-07-08 MED ORDER — FUROSEMIDE 10 MG/ML IJ SOLN
40.0000 mg | Freq: Once | INTRAMUSCULAR | Status: AC
  Administered 2016-07-08: 40 mg via INTRAVENOUS
  Filled 2016-07-08: qty 4

## 2016-07-08 MED ORDER — IOPAMIDOL (ISOVUE-370) INJECTION 76%
100.0000 mL | Freq: Once | INTRAVENOUS | Status: AC | PRN
  Administered 2016-07-08: 100 mL via INTRAVENOUS

## 2016-07-08 NOTE — ED Provider Notes (Signed)
[facility] DEPT Provider Note   CSN: [id number] Arrival date & time: 07/07/16  2353  By signing my name below, I, Collene Leyden, attest that this documentation has been prepared under the direction and in the presence of Rolland Porter, MD. Electronically Signed: Collene Leyden, Scribe. 07/08/16. 1:00 AM.  Time seen 12:50 AM  History   Chief Complaint Chief Complaint  Patient presents with  . Shortness of Breath   HPI Comments: Wayne Meadows is a 82 y.o. male with a history of COPD, CAD, CVA, HTN, and renal carcinoma, who presents to the Emergency Department by ambulance, complaining of intermittent shortness of breath that began earlier today.He had some trouble breathing this afternoon and then it got better and then he states this evening it got worse again. Patient reports having intermittent trouble with inhalation, he states he feels like he can't take a big deep breath because his abdomen feels tight.. Patient states he feels as if his stomach is full of air. Patient states he received a steroid injection one week ago for his back problems. Patient reports associated cough, wheezing, and bilateral leg swelling (began several days ago but usually goes away during the night). No modifying factors indicated. Occasional drinker. Patient denies any fever, chills, diaphoresis, chest pain, nausea, or vomiting. He is not on oxygen at home, he states he's never been told he had congestive heart failure. He states he was admitted before for pneumonia and this feels different.  The history is provided by the patient. No language interpreter was used.    Past Medical History:  Diagnosis Date  . Arthritis   . Carotid stenosis   . COPD (chronic obstructive pulmonary disease) ([place])   . Coronary artery disease    S/p PCI 2011;  NSTEMI 12/12:  LHC/PCI 02/23/11: LAD 60% after the septal perforator, D1 occluded with distal collaterals, proximal RI 30-40%, AV circumflex stent patent with 60%  stenosis after the stent, RCA 99%, EF 60-65%.  His RCA was treated with a bare-metal stent  . CVA (cerebral infarction) 2011   Right cerebral; total obstruction of the right ICA  . Diverticulitis   . Hypertension   . Rectal bleeding 07/2015  . Renal carcinoma ([place])   . Tobacco abuse, in remission     Patient Active Problem List   Diagnosis Date Noted  . Sepsis, unspecified organism ([place]) 04/11/2016  . COPD exacerbation ([place]) 04/11/2016  . CKD (chronic kidney disease), stage III 04/11/2016  . Hyperbilirubinemia 04/11/2016  . Elevated lactic acid level   . Renal insufficiency   . Segmental colitis ([place]) 09/04/2015  . Generalized abdominal pain 09/04/2015  . Noninfectious gastroenteritis, unspecified   . Benign neoplasm of transverse colon   . Benign neoplasm of colon   . Bright red blood per rectum   . Acute blood loss anemia   . Dysphagia   . Renal cell cancer ([place])   . Essential hypertension   . Centrilobular emphysema ([place])   . Blood in stool 08/19/2015  . Rectal bleeding 08/19/2015  . Chest pain 04/08/2014  . CAP (community acquired pneumonia) 04/08/2014  . Community acquired pneumonia of left lower lobe of lung ([place]) 04/08/2014  . Overweight (BMI 25.0-29.9) 04/08/2014  . Healthcare-associated pneumonia 10/26/2011  . Cholelithiasis 04/26/2011  . Atrial fibrillation ([place]) 04/26/2011  . Chest pain 03/19/2011  . Hypertension 03/19/2011  . Unstable angina ([place]) 03/18/2011    Class: Acute  . Lung nodule 03/02/2011  . Liver lesion 03/02/2011  . HLD (hyperlipidemia) 03/02/2011  .  Abnormal abdominal CT scan 02/23/2011  . Non Q wave myocardial infarction ([place]) 02/22/2011    Class: Acute  . Cerebrovascular disease 02/22/2011  . Coronary artery disease due to lipid rich plaque 02/22/2011  . COPD (chronic obstructive pulmonary disease) ([place]) 02/22/2011  . Tobacco abuse, 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/22/2011    Past Surgical History:  Procedure Laterality Date  . COLON SURGERY    .  COLONOSCOPY N/A 08/22/2015   Procedure: COLONOSCOPY;  Surgeon: [name], MD;  Location: [facility] ENDOSCOPY;  Service: Endoscopy;  Laterality: N/A;  . hip relacement    . KIDNEY SURGERY    . LEFT HEART CATHETERIZATION WITH CORONARY ANGIOGRAM N/A 02/23/2011   Procedure: LEFT HEART CATHETERIZATION WITH CORONARY ANGIOGRAM;  Surgeon: [name], MD;  Location[facility] C[facility] LAB;  Service: Cardiovascular;  Laterality: N/A;  . LEFT HEART CATHETERIZATION WITH CORONARY ANGIOGRAM N/A 03/18/2011   Procedure: LEFT HEART CATHETERIZATION WITH CORONARY ANGIOGRAM;  Surgeon: [name], MD;  Location: [facility] CATH LAB;  Service: Cardiovascular;  Laterality: N/A;  . PERCUTANEOUS CORONARY STENT INTERVENTION (PCI-S) N/A 02/23/2011   Procedure: PERCUTANEOUS CORONARY STENT INTERVENTION (PCI-S);  Surgeon: [name], MD;  Location: [facility] CATH LAB;  Service: Cardiovascular;  Laterality: N/A;  . TEMPORARY PACEMAKER INSERTION N/A 02/23/2011   Procedure: TEMPORARY PACEMAKER INSERTION;  Surgeon: [name], MD;  Location: [facility] CATH LAB;  Service: Cardiovascular;  Laterality: N/A;       Home Medications    Prior to Admission medications   Medication Sig Start Date End Date Taking? Authorizing Provider  albuterol (PROVENTIL HFA;VENTOLIN HFA) 108 (90 Base) MCG/ACT inhaler Inhale 1-2 puffs into the lungs every 6 (six) hours as needed for wheezing or shortness of breath.   Yes [provider]  amLODipine (NORVASC) 5 MG tablet Take 5 mg by mouth daily.   Yes [provider]  aspirin EC 81 MG tablet Take 81 mg by mouth daily.     Yes [provider]  baclofen (LIORESAL) 10 MG tablet Take 10 mg by mouth at bedtime.    Yes [provider]  clopidogrel (PLAVIX) 75 MG tablet Take 75 mg by mouth daily.   Yes [provider]  enalapril (VASOTEC) 20 MG tablet Take 20 mg by mouth at bedtime.    Yes [provider]  Flaxseed, Linseed, (FLAX SEED OIL) 1000 MG CAPS Take 1,000 mg  by mouth daily.   Yes [provider]  gabapentin (NEURONTIN) 300 MG capsule Take 300-600 mg by mouth 3 (three) times daily. Takes one capsule in the morning and one at noon, then takes two capsules at bedtime.   Yes [provider]  HYDROcodone-acetaminophen (NORCO) 10-325 MG tablet Take 1 tablet by mouth 2 (two) times daily as needed for moderate pain.    Yes [provider]  Ipratropium-Albuterol (COMBIVENT) 20-100 MCG/ACT AERS respimat Inhale 1 puff into the lungs 4 (four) times daily.   Yes [provider]  MESALAMINE PO Take 1,500 mg by mouth daily.   Yes [provider]  metoprolol tartrate (LOPRESSOR) 25 MG tablet Take 12.5 mg by mouth 2 (two) times daily.   Yes [provider]  mometasone (ASMANEX) 220 MCG/INH inhaler Inhale 1 puff into the lungs at bedtime.     Yes [provider]  Multiple Vitamin (MULTIVITAMIN) tablet Take 1 tablet by mouth daily.   Yes [provider]  Omega-3 Fatty Acids (FISH OIL) 1200 MG CAPS Take 1 capsule by mouth 2 (two) times daily.  Yes [provider]  pantoprazole (PROTONIX) 20 MG tablet Take 1 tablet (20 mg total) by mouth 2 (two) times daily before a meal. 08/09/15  Yes Pollina, Gwenyth Allegra, MD  polyethylene glycol (MIRALAX / GLYCOLAX) packet Take 17 g by mouth daily. Patient taking differently: Take 17 g by mouth daily as needed.  08/22/15  Yes Lavina Hamman, MD  pravastatin (PRAVACHOL) 40 MG tablet Take 40 mg by mouth at bedtime.   Yes [provider]  azithromycin (ZITHROMAX) 250 MG tablet Take 1 tablet (250 mg total) by mouth daily. Take first 2 tablets together, then 1 every day until finished. 07/08/16   Rolland Porter, MD  EPINEPHrine (EPI-PEN) 0.3 mg/0.3 mL DEVI Inject 0.3 mg into the muscle once.    [provider]  nitroGLYCERIN (NITROSTAT) 0.4 MG SL tablet Place 0.4 mg under the tongue every 5 (five) minutes as needed for chest pain.    [provider]  predniSONE (DELTASONE) 20 MG tablet Take 3 po QD x 3d , then 2 po QD x 3d then 1 po QD x 3d 07/08/16   Rolland Porter, MD    Family History Family History  Problem Relation Age of Onset  . Heart attack Unknown 17    Social History Social History  Substance Use Topics  . Smoking status: Former Smoker    Types: Cigarettes    Quit date: 02/24/2007  . Smokeless tobacco: Never Used  . Alcohol use 0.6 oz/week    1 Standard drinks or equivalent per week     Comment: socially   lives at home   Allergies   Bee venom and Influenza vaccines   Review of Systems Review of Systems  Constitutional: Negative for chills, diaphoresis and fever.  Respiratory: Positive for cough, shortness of breath and wheezing.   Cardiovascular: Negative for chest pain.  Gastrointestinal: Positive for abdominal distention. Negative for nausea and vomiting.  All other systems reviewed and are negative.    Physical Exam Updated Vital Signs BP (!) 159/72 (BP Location: Left Arm)   Pulse 86   Temp 98.1 F (36.7 C) (Oral)   Resp 17   SpO2 97%   Vital signs normal    Physical Exam  Constitutional: He is oriented to person, place, and time. He appears well-developed and well-nourished.  Non-toxic appearance. He does not appear ill. No distress.  HENT:  Head: Normocephalic and atraumatic.  Right Ear: External ear normal.  Left Ear: External ear normal.  Nose: Nose normal. No mucosal edema or rhinorrhea.  Mouth/Throat: Oropharynx is clear and moist and mucous membranes are normal. No dental abscesses or uvula swelling.  Eyes: Conjunctivae and EOM are normal. Pupils are equal, round, and reactive to light.  Neck: Normal range of motion and full passive range of motion without pain. Neck supple.  Cardiovascular: Normal rate, regular rhythm and normal heart sounds.  Exam reveals no gallop and no friction rub.   No murmur heard. Pulmonary/Chest: No respiratory distress. He has decreased breath  sounds. He has no wheezes. He has no rhonchi. He has no rales. He exhibits no tenderness and no crepitus.  Abdominal: Soft. Normal appearance and bowel sounds are normal. He exhibits distension. There is no tenderness. There is no rebound and no guarding.  Musculoskeletal: Normal range of motion. He exhibits edema (Trace edema, almost up to his knees). He exhibits no tenderness.  Moves all extremities well. 1+ at ankles, trace almost up to knees  Neurological: He is alert and  oriented to person, place, and time. He has normal strength. No cranial nerve deficit.  Skin: Skin is warm, dry and intact. No rash noted. No erythema. No pallor.  Psychiatric: He has a normal mood and affect. His speech is normal and behavior is normal. His mood appears not anxious.  Nursing note and vitals reviewed.    ED Treatments / Results   Results for orders placed or performed during the hospital encounter of 07/07/16  CBC with Differential  Result Value Ref Range   WBC 12.0 (H) 4.0 - 10.5 K/uL   RBC 4.16 (L) 4.22 - 5.81 MIL/uL   Hemoglobin 12.9 (L) 13.0 - 17.0 g/dL   HCT 38.1 (L) 39.0 - 52.0 %   MCV 91.6 78.0 - 100.0 fL   MCH 31.0 26.0 - 34.0 pg   MCHC 33.9 30.0 - 36.0 g/dL   RDW 13.6 11.5 - 15.5 %   Platelets 326 150 - 400 K/uL   Neutrophils Relative % 71 %   Neutro Abs 8.6 (H) 1.7 - 7.7 K/uL   Lymphocytes Relative 18 %   Lymphs Abs 2.1 0.7 - 4.0 K/uL   Monocytes Relative 9 %   Monocytes Absolute 1.1 (H) 0.1 - 1.0 K/uL   Eosinophils Relative 2 %   Eosinophils Absolute 0.2 0.0 - 0.7 K/uL   Basophils Relative 0 %   Basophils Absolute 0.0 0.0 - 0.1 K/uL  Comprehensive metabolic panel  Result Value Ref Range   Sodium 137 135 - 145 mmol/L   Potassium 3.8 3.5 - 5.1 mmol/L   Chloride 104 101 - 111 mmol/L   CO2 26 22 - 32 mmol/L   Glucose, Bld 119 (H) 65 - 99 mg/dL   BUN 14 6 - 20 mg/dL   Creatinine, Ser 1.32 (H) 0.61 - 1.24 mg/dL   Calcium 8.9 8.9 - 10.3 mg/dL   Total Protein 6.7 6.5 - 8.1 g/dL    Albumin 3.5 3.5 - 5.0 g/dL   AST 22 15 - 41 U/L   ALT 19 17 - 63 U/L   Alkaline Phosphatase 57 38 - 126 U/L   Total Bilirubin 0.6 0.3 - 1.2 mg/dL   GFR calc non Af Amer 50 (L) >60 mL/min   GFR calc Af Amer 58 (L) >60 mL/min   Anion gap 7 5 - 15  Brain natriuretic peptide  Result Value Ref Range   B Natriuretic Peptide 42.0 0.0 - 100.0 pg/mL  Troponin I  Result Value Ref Range   Troponin I <0.03 <0.03 ng/mL   Laboratory interpretation all normal except Leukocytosis, hyperglycemia, renal insufficiency   EKG  EKG Interpretation None         Radiology Dg Chest 2 View  Result Date: 07/08/2016 CLINICAL DATA:  Shortness of breath for 3 days, progressive over the last few hours. EXAM: CHEST  2 VIEW COMPARISON:  Radiographs 04/12/2016 FINDINGS: Chronic hyperinflation and bronchial thickening. Stable right basilar scarring. Improved left lung opacity from prior exam. Unchanged heart size and mediastinal contours allowing for differences in technique. No pulmonary edema. No new airspace disease. No pleural effusion or pneumothorax. Osseous structures are stable. IMPRESSION: Chronic hyperinflation and bronchial thickening. Right basilar scarring. No acute abnormality. Electronically Signed   By: Jeb Levering M.D.   On: 07/08/2016 00:38   Ct Angio Chest Pe W/cm &/or Wo Cm  Result Date: 07/08/2016 CLINICAL DATA:  Sudden onset of shortness of breath. Shortness of breath different from prior difficulty breathing or pneumonia. Bilateral leg swelling. EXAM: CT  ANGIOGRAPHY CHEST WITH CONTRAST TECHNIQUE: Multidetector CT imaging of the chest was performed using the standard protocol during bolus administration of intravenous contrast. Multiplanar CT image reconstructions and MIPs were obtained to evaluate the vascular anatomy. CONTRAST:  100 cc Isovue 370 IV COMPARISON:  Radiographs earlier this same day.  Chest CT 10/25/2011 FINDINGS: Cardiovascular: There are no filling defects within the  pulmonary arteries to suggest pulmonary embolus. Lower lung zone evaluation partially obscured by breathing motion artifact. Moderate atherosclerosis of the thoracic aorta without aneurysm or dissection. The heart is normal in size. Coronary artery calcifications are seen. Mediastinum/Nodes: No mediastinal, hilar, or axillary adenopathy. No pericardial effusion. The esophagus is decompressed. Lungs/Pleura: Upper lobe predominant emphysema. Mild biapical pleuroparenchymal scarring. Mild central bronchial thickening most prominent in the lower lobes. Peripheral streaky atelectasis, right greater than left lower lobe. Minimal mucous plugging in the posterior basal left lower lobe. No evidence of pulmonary edema. No pleural effusion. No pulmonary mass or suspicious nodule. Upper Abdomen: Multiple bilateral renal cysts. Hepatic meningioma again seen, unchanged from abdominal CT 08/19/2015. No acute abnormality. Musculoskeletal: There are no acute or suspicious osseous abnormalities. Review of the MIP images confirms the above findings. IMPRESSION: 1. No pulmonary embolus. 2. Moderate emphysema. Central bronchial thickening is likely chronic. Minimal mucous plugging in the posterior basal left lower lobe. Streaky lower lobe atelectasis. 3. Thoracic aortic atherosclerosis.  Coronary artery calcifications. Electronically Signed   By: Jeb Levering M.D.   On: 07/08/2016 04:11    Procedures Procedures (including critical care time)  Medications Ordered in ED Medications  albuterol (PROVENTIL) (2.5 MG/3ML) 0.083% nebulizer solution 5 mg (5 mg Nebulization Given 07/08/16 0039)  albuterol (PROVENTIL) (2.5 MG/3ML) 0.083% nebulizer solution 5 mg (5 mg Nebulization Given 07/08/16 0146)  methylPREDNISolone sodium succinate (SOLU-MEDROL) 125 mg/2 mL injection 125 mg (125 mg Intravenous Given 07/08/16 0113)  furosemide (LASIX) injection 40 mg (40 mg Intravenous Given 07/08/16 0115)  albuterol (PROVENTIL,VENTOLIN) solution  continuous neb (10 mg/hr Nebulization Given 07/08/16 0252)  iopamidol (ISOVUE-370) 76 % injection 100 mL (100 mLs Intravenous Contrast Given 07/08/16 0348)  albuterol (PROVENTIL) (2.5 MG/3ML) 0.083% nebulizer solution 5 mg (5 mg Nebulization Given 07/08/16 0500)  ipratropium (ATROVENT) nebulizer solution 0.5 mg (0.5 mg Nebulization Given 07/08/16 0500)     Initial Impression / Assessment and Plan / ED Course  I have reviewed the triage vital signs and the nursing notes.  Pertinent labs & imaging results that were available during my care of the patient were reviewed by me and considered in my medical decision making (see chart for details).    DIAGNOSTIC STUDIES: Oxygen Saturation is 97% on RA, normal by my interpretation.    COORDINATION OF CARE: 12:59 AM Discussed treatment plan with pt at bedside and pt agreed to plan, which includes blood work and lasix. Patient was given a nebulizer treatment and he was given IV soluMedrol.  2:36 AM recheck after nebulizer treatment and Solu-Medrol. Patient states his leg swelling and shortness of breath have improved significantly after breathing treatment and lasix. Patient has had good urine output after given lasix. Discussed second breathing treatment and CT scan. Patient now has wheezing on exam. We discussed doing CT scan angiogram to look for PE. He states this shortness of breath is different from his prior COPD exacerbation and his histories of pneumonia. With the swelling is his legs there was concern for PE.  4:25 AM we discussed his CT results which were good, there is no underlying pneumonia or pulmonary embolus.  On exam patient still has some scattered wheezing. He received an additional albuterol nebulizer treatment.  5:20 AM patient was ambulated by nursing staff he reports his pulse ox remained above 97%. Patient did not get more short of breath. When I examined his lungs he now is clear without any wheezing. At this point he feels ready to  be discharged home.    Final Clinical Impressions(s) / ED Diagnoses   Final diagnoses:  COPD exacerbation (HCC)    New Prescriptions New Prescriptions   AZITHROMYCIN (ZITHROMAX) 250 MG TABLET    Take 1 tablet (250 mg total) by mouth daily. Take first 2 tablets together, then 1 every day until finished.   PREDNISONE (DELTASONE) 20 MG TABLET    Take 3 po QD x 3d , then 2 po QD x 3d then 1 po QD x 3d   Plan discharge  Rolland Porter, MD, FACEP  I personally performed the services described in this documentation, which was scribed in my presence. The recorded information has been reviewed and considered.  Rolland Porter, MD, Barbette Or, MD 07/08/16 [phone]

## 2016-07-08 NOTE — Discharge Instructions (Signed)
Continue using your inhalers and nebulizer's as needed for shortness of breath or wheezing. Take the prednisone and the antibiotic, Zpak, until gone. Recheck if you get a high fever, or your shortness of breath is getting worse instead of better.

## 2016-09-18 ENCOUNTER — Emergency Department (HOSPITAL_COMMUNITY)
Admission: EM | Admit: 2016-09-18 | Discharge: 2016-09-18 | Disposition: A | Discharge status: Home / Self Care | Payer: Medicare Other | Attending: Emergency Medicine | Admitting: Emergency Medicine

## 2016-09-18 ENCOUNTER — Encounter (HOSPITAL_COMMUNITY): Payer: Self-pay | Admitting: Emergency Medicine

## 2016-09-18 ENCOUNTER — Emergency Department (HOSPITAL_COMMUNITY): Payer: Medicare Other

## 2016-09-18 DIAGNOSIS — Z79899 Other long term (current) drug therapy: Secondary | ICD-10-CM | POA: Diagnosis not present

## 2016-09-18 DIAGNOSIS — Z87891 Personal history of nicotine dependence: Secondary | ICD-10-CM | POA: Insufficient documentation

## 2016-09-18 DIAGNOSIS — N183 Chronic kidney disease, stage 3 (moderate): Secondary | ICD-10-CM | POA: Diagnosis not present

## 2016-09-18 DIAGNOSIS — J441 Chronic obstructive pulmonary disease with (acute) exacerbation: Secondary | ICD-10-CM

## 2016-09-18 DIAGNOSIS — Z7982 Long term (current) use of aspirin: Secondary | ICD-10-CM | POA: Diagnosis not present

## 2016-09-18 DIAGNOSIS — R609 Edema, unspecified: Secondary | ICD-10-CM

## 2016-09-18 DIAGNOSIS — I2510 Atherosclerotic heart disease of native coronary artery without angina pectoris: Secondary | ICD-10-CM | POA: Insufficient documentation

## 2016-09-18 DIAGNOSIS — I1310 Hypertensive heart and chronic kidney disease without heart failure, with stage 1 through stage 4 chronic kidney disease, or unspecified chronic kidney disease: Secondary | ICD-10-CM | POA: Insufficient documentation

## 2016-09-18 DIAGNOSIS — R0602 Shortness of breath: Secondary | ICD-10-CM | POA: Diagnosis present

## 2016-09-18 DIAGNOSIS — R600 Localized edema: Secondary | ICD-10-CM | POA: Diagnosis not present

## 2016-09-18 DIAGNOSIS — R9431 Abnormal electrocardiogram [ECG] [EKG]: Secondary | ICD-10-CM | POA: Diagnosis not present

## 2016-09-18 LAB — COMPREHENSIVE METABOLIC PANEL
ALT: 23 U/L (ref 17–63)
AST: 33 U/L (ref 15–41)
Albumin: 4 g/dL (ref 3.5–5.0)
Alkaline Phosphatase: 60 U/L (ref 38–126)
Anion gap: 11 (ref 5–15)
BILIRUBIN TOTAL: 0.7 mg/dL (ref 0.3–1.2)
BUN: 13 mg/dL (ref 6–20)
CO2: 26 mmol/L (ref 22–32)
CREATININE: 1.49 mg/dL — AB (ref 0.61–1.24)
Calcium: 9.3 mg/dL (ref 8.9–10.3)
Chloride: 104 mmol/L (ref 101–111)
GFR, EST AFRICAN AMERICAN: 50 mL/min — AB (ref >60)
GFR, EST NON AFRICAN AMERICAN: 43 mL/min — AB (ref >60)
Glucose, Bld: 148 mg/dL — ABNORMAL HIGH (ref 65–99)
POTASSIUM: 3.9 mmol/L (ref 3.5–5.1)
Sodium: 141 mmol/L (ref 135–145)
TOTAL PROTEIN: 7.4 g/dL (ref 6.5–8.1)

## 2016-09-18 LAB — CBC WITH DIFFERENTIAL/PLATELET
BASOS ABS: 0.1 10*3/uL (ref 0.0–0.1)
Basophils Relative: 1 %
EOS PCT: 3 %
Eosinophils Absolute: 0.2 10*3/uL (ref 0.0–0.7)
HEMATOCRIT: 40.4 % (ref 39.0–52.0)
Hemoglobin: 13.6 g/dL (ref 13.0–17.0)
LYMPHS ABS: 1.6 10*3/uL (ref 0.7–4.0)
LYMPHS PCT: 21 %
MCH: 30.8 pg (ref 26.0–34.0)
MCHC: 33.7 g/dL (ref 30.0–36.0)
MCV: 91.6 fL (ref 78.0–100.0)
MONO ABS: 0.5 10*3/uL (ref 0.1–1.0)
Monocytes Relative: 6 %
NEUTROS ABS: 5.6 10*3/uL (ref 1.7–7.7)
Neutrophils Relative %: 71 %
Platelets: 311 10*3/uL (ref 150–400)
RBC: 4.41 MIL/uL (ref 4.22–5.81)
RDW: 13.1 % (ref 11.5–15.5)
WBC: 8 10*3/uL (ref 4.0–10.5)

## 2016-09-18 LAB — I-STAT TROPONIN, ED
TROPONIN I, POC: 0 ng/mL (ref 0.00–0.08)
Troponin i, poc: 0 ng/mL (ref 0.00–0.08)

## 2016-09-18 LAB — BRAIN NATRIURETIC PEPTIDE: B NATRIURETIC PEPTIDE 5: 37.5 pg/mL (ref 0.0–100.0)

## 2016-09-18 MED ORDER — IPRATROPIUM-ALBUTEROL 0.5-2.5 (3) MG/3ML IN SOLN
3.0000 mL | RESPIRATORY_TRACT | Status: AC
  Administered 2016-09-18 (×2): 3 mL via RESPIRATORY_TRACT
  Filled 2016-09-18: qty 6

## 2016-09-18 MED ORDER — PREDNISONE 10 MG PO TABS: 40.0000 mg | ORAL_TABLET | Freq: Every day | ORAL | 0 refills | Status: AC

## 2016-09-18 NOTE — ED Provider Notes (Signed)
[facility] DEPT Provider Note   CSN: [id number] Arrival date & time: 09/18/16  6659     History   Chief Complaint Chief Complaint  Patient presents with  . Shortness of Breath  . Leg Swelling    HPI Wayne Meadows is a 82 y.o. male.  The history is provided by the patient.  Shortness of Breath  This is a recurrent problem. Duration: several weeks. The problem occurs frequently.The problem has been gradually worsening. Associated symptoms include cough, sputum production, wheezing, orthopnea and leg swelling. Pertinent negatives include no fever, no rhinorrhea and no chest pain. He has tried beta-agonist inhalers for the symptoms. The treatment provided mild relief. Associated medical issues include COPD. Associated medical issues do not include CAD, heart failure or past MI.   [facility], increased 2 weeks ago. Scheduled for ECHO in Sept. Never diagnosed with HF.  Past Medical History:  Diagnosis Date  . Arthritis   . Carotid stenosis   . COPD (chronic obstructive pulmonary disease) ([name])   . Coronary artery disease    S/p PCI 2011;  NSTEMI 12/12:  LHC/PCI 02/23/11: LAD 60% after the septal perforator, D1 occluded with distal collaterals, proximal RI 30-40%, AV circumflex stent patent with 60% stenosis after the stent, RCA 99%, EF 60-65%.  His RCA was treated with a bare-metal stent  . CVA (cerebral infarction) 2011   Right cerebral; total obstruction of the right ICA  . Diverticulitis   . Hypertension   . Rectal bleeding 07/2015  . Renal carcinoma ([facility])   . Tobacco abuse, in remission     Patient Active Problem List   Diagnosis Date Noted  . Sepsis, unspecified organism ([name]) 04/11/2016  . COPD exacerbation ([name]) 04/11/2016  . CKD (chronic kidney disease), stage III 04/11/2016  . Hyperbilirubinemia 04/11/2016  . Elevated lactic acid level   . Renal insufficiency   . Segmental colitis ([name]) 09/04/2015  . Generalized abdominal pain  09/04/2015  . Noninfectious gastroenteritis, unspecified   . Benign neoplasm of transverse colon   . Benign neoplasm of colon   . Bright red blood per rectum   . Acute blood loss anemia   . Dysphagia   . Renal cell cancer ([name])   . Essential hypertension   . Centrilobular emphysema ([name])   . Blood in stool 08/19/2015  . Rectal bleeding 08/19/2015  . Chest pain 04/08/2014  . CAP (community acquired pneumonia) 04/08/2014  . Community acquired pneumonia of left lower lobe of lung ([place]) 04/08/2014  . Overweight (BMI 25.0-29.9) 04/08/2014  . Healthcare-associated pneumonia 10/26/2011  . Cholelithiasis 04/26/2011  . Atrial fibrillation ([place]) 04/26/2011  . Chest pain 03/19/2011  . Hypertension 03/19/2011  . Unstable angina ([place]) 03/18/2011    Class: Acute  . Lung nodule 03/02/2011  . Liver lesion 03/02/2011  . HLD (hyperlipidemia) 03/02/2011  . Abnormal abdominal CT scan 02/23/2011  . Non Q wave myocardial infarction ([place]) 02/22/2011    Class: Acute  . Cerebrovascular disease 02/22/2011  . Coronary artery disease due to lipid rich plaque 02/22/2011  . COPD (chronic obstructive pulmonary disease) ([place]) 02/22/2011  . Tobacco abuse, 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/22/2011    Past Surgical History:  Procedure Laterality Date  . COLON SURGERY    . COLONOSCOPY N/A 08/22/2015   Procedure: COLONOSCOPY;  Surgeon: [name], MD;  Location: [facility];  Service: Endoscopy;  Laterality: N/A;  . hip relacement    . KIDNEY SURGERY    . LEFT  HEART CATHETERIZATION WITH CORONARY ANGIOGRAM N/A 02/23/2011   Procedure: LEFT HEART CATHETERIZATION WITH CORONARY ANGIOGRAM;  Surgeon: [name], MD;  Location: [facility] CATH LAB;  Service: Cardiovascular;  Laterality: N/A;  . LEFT HEART CATHETERIZATION WITH CORONARY ANGIOGRAM N/A 03/18/2011   Procedure: LEFT HEART CATHETERIZATION WITH CORONARY ANGIOGRAM;  Surgeon: [name], MD;  Location: [facility] CATH LAB;  Service: Cardiovascular;  Laterality: N/A;  .  PERCUTANEOUS CORONARY STENT INTERVENTION (PCI-S) N/A 02/23/2011   Procedure: PERCUTANEOUS CORONARY STENT INTERVENTION (PCI-S);  Surgeon: [name], MD;  Location: [facility] CATH LAB;  Service: Cardiovascular;  Laterality: N/A;  . TEMPORARY PACEMAKER INSERTION N/A 02/23/2011   Procedure: TEMPORARY PACEMAKER INSERTION;  Surgeon: [name], MD;  Location: [facility] CATH LAB;  Service: Cardiovascular;  Laterality: N/A;       Home Medications    Prior to Admission medications   Medication Sig Start Date End Date Taking? Authorizing Provider  albuterol (PROVENTIL HFA;VENTOLIN HFA) 108 (90 Base) MCG/ACT inhaler Inhale 1-2 puffs into the lungs every 6 (six) hours as needed for wheezing or shortness of breath.   Yes [provider]  amLODipine (NORVASC) 5 MG tablet Take 5 mg by mouth daily.   Yes [provider]  aspirin EC 81 MG tablet Take 81 mg by mouth daily.     Yes [provider]  baclofen (LIORESAL) 10 MG tablet Take 10 mg by mouth at bedtime.    Yes [provider]  clopidogrel (PLAVIX) 75 MG tablet Take 75 mg by mouth daily.   Yes [provider]  enalapril (VASOTEC) 20 MG tablet Take 20 mg by mouth at bedtime.    Yes [provider]  EPINEPHrine (EPI-PEN) 0.3 mg/0.3 mL DEVI Inject 0.3 mg into the muscle once.   Yes [provider]  Flaxseed, Linseed, (FLAX SEED OIL) 1000 MG CAPS Take 1,000 mg by mouth daily.   Yes [provider]  furosemide (LASIX) 40 MG tablet Take 40 mg by mouth 2 (two) times daily.   Yes [provider]  gabapentin (NEURONTIN) 300 MG capsule Take 300-600 mg by mouth 3 (three) times daily. Takes one capsule in the morning and one at noon, then takes two capsules at bedtime.   Yes [provider]  HYDROcodone-acetaminophen (NORCO) 10-325 MG tablet Take 1 tablet by mouth 2 (two) times daily as needed for moderate pain.    Yes [provider]  Ipratropium-Albuterol (COMBIVENT)  20-100 MCG/ACT AERS respimat Inhale 1 puff into the lungs 4 (four) times daily.   Yes [provider]  MESALAMINE PO Take 1,500 mg by mouth daily.   Yes [provider]  metoprolol tartrate (LOPRESSOR) 25 MG tablet Take 12.5 mg by mouth 2 (two) times daily.   Yes [provider]  mometasone (ASMANEX) 220 MCG/INH inhaler Inhale 1 puff into the lungs at bedtime.     Yes [provider]  Multiple Vitamin (MULTIVITAMIN) tablet Take 1 tablet by mouth daily.   Yes [provider]  nitroGLYCERIN (NITROSTAT) 0.4 MG SL tablet Place 0.4 mg under the tongue every 5 (five) minutes as needed for chest pain.   Yes [provider]  Omega-3 Fatty Acids (FISH OIL) 1200 MG CAPS Take 1 capsule by mouth 2 (two) times daily.    Yes [provider]  pantoprazole (PROTONIX) 20 MG tablet Take 1 tablet (20 mg total) by mouth 2 (two) times daily before a meal. 08/09/15  Yes Pollina, Gwenyth Allegra, MD  polyethylene glycol (  MIRALAX / GLYCOLAX) packet Take 17 g by mouth daily. Patient taking differently: Take 17 g by mouth daily as needed for mild constipation or moderate constipation.  08/22/15  Yes Lavina Hamman, MD  pravastatin (PRAVACHOL) 40 MG tablet Take 40 mg by mouth at bedtime.   Yes [provider]  azithromycin (ZITHROMAX) 250 MG tablet Take 1 tablet (250 mg total) by mouth daily. Take first 2 tablets together, then 1 every day until finished. Patient not taking: Reported on 09/18/2016 07/08/16   Rolland Porter, MD  predniSONE (DELTASONE) 10 MG tablet Take 4 tablets (40 mg total) by mouth daily. 09/18/16 09/22/16  Fatima Blank, MD    Family History Family History  Problem Relation Age of Onset  . Heart attack Unknown 32    Social History Social History  Substance Use Topics  . Smoking status: Former Smoker    Types: Cigarettes    Quit date: 02/24/2007  . Smokeless tobacco: Never Used  . Alcohol use 0.6 oz/week    1 Standard drinks or  equivalent per week     Comment: socially      Allergies   Bee venom and Influenza vaccines   Review of Systems Review of Systems  Constitutional: Negative for fever.  HENT: Negative for rhinorrhea.   Respiratory: Positive for cough, sputum production, shortness of breath and wheezing.   Cardiovascular: Positive for orthopnea and leg swelling. Negative for chest pain.   All other systems are reviewed and are negative for acute change except as noted in the HPI   Physical Exam Updated Vital Signs BP (!) 155/70 (BP Location: Left Arm)   Pulse 78   Temp 98.6 F (37 C)   Resp 15   SpO2 96%   Physical Exam  Constitutional: He is oriented to person, place, and time. He appears well-developed and well-nourished. No distress.  HENT:  Head: Normocephalic and atraumatic.  Nose: Nose normal.  Eyes: Pupils are equal, round, and reactive to light. Conjunctivae and EOM are normal. Right eye exhibits no discharge. Left eye exhibits no discharge. No scleral icterus.  Neck: Normal range of motion. Neck supple.  Cardiovascular: Normal rate and regular rhythm.  Exam reveals no gallop and no friction rub.   No murmur heard. Pulmonary/Chest: Effort normal. No stridor. No respiratory distress. He has wheezes (diffuse). He has rales in the right lower field and the left lower field.  Abdominal: Soft. He exhibits no distension. There is no tenderness.  Musculoskeletal: He exhibits no edema or tenderness.  2+ pitting BLE edema up to knee  Neurological: He is alert and oriented to person, place, and time.  Skin: Skin is warm and dry. No rash noted. He is not diaphoretic. No erythema.  Psychiatric: He has a normal mood and affect.  Vitals reviewed.    ED Treatments / Results  Labs (all labs ordered are listed, but only abnormal results are displayed) Labs Reviewed  COMPREHENSIVE METABOLIC PANEL - Abnormal; Notable for the following:       Result Value   Glucose, Bld 148 (*)    Creatinine,  Ser 1.49 (*)    GFR calc non Af Amer 43 (*)    GFR calc Af Amer 50 (*)    All other components within normal limits  CBC WITH DIFFERENTIAL/PLATELET  BRAIN NATRIURETIC PEPTIDE  I-STAT TROPONIN, ED  I-STAT TROPONIN, ED    EKG  EKG Interpretation  Date/Time:  Friday September 18 2016 09:32:07 EDT Ventricular Rate:  83 PR Interval:  QRS Duration: 101 QT Interval:  373 QTC Calculation: 439 R Axis:   88 Text Interpretation:  Sinus rhythm Borderline right axis deviation resolved TWI in anterior leads NO STEMI Confirmed by Addison Lank [phone]) on 09/18/2016 11:20:46 AM       Radiology Dg Chest 2 View  Result Date: 09/18/2016 CLINICAL DATA:  Cough and shortness of breath beginning today. Symptoms are intermittent. EXAM: CHEST  2 VIEW COMPARISON:  07/08/2016 and multiple previous FINDINGS: Heart size is normal. There is aortic atherosclerosis. The lungs show emphysema with pleural and parenchymal scarring at the apices, with some calcification on the right. Areas of chronic scarring are again seen in both lower lobes. No sign of effusions. No infiltrate, collapse or effusion. No acute bone finding. Previous spinal surgery. IMPRESSION: No active disease detected by radiography. Emphysema in chronic pulmonary scarring as outlined above. Electronically Signed   By: Nelson Chimes M.D.   On: 09/18/2016 10:31    Procedures Procedures (including critical care time)  Medications Ordered in ED Medications  ipratropium-albuterol (DUONEB) 0.5-2.5 (3) MG/3ML nebulizer solution 3 mL (3 mLs Nebulization Not Given 09/18/16 1040)     Initial Impression / Assessment and Plan / ED Course  I have reviewed the triage vital signs and the nursing notes.  Pertinent labs & imaging results that were available during my care of the patient were reviewed by me and considered in my medical decision making (see chart for details).     Workup grossly reassuring and not suggestive of heart failure, or ACS. Low  suspicion for pulmonary embolism. Patient did have diffuse wheezing that was consistent with COPD exacerbation. This improved following the breathing treatment. Chest x-ray without evidence of pneumonia, pleural effusions, pulmonary edema.   The patient is safe for discharge with strict return precautions.  Final Clinical Impressions(s) / ED Diagnoses   Final diagnoses:  Peripheral edema  COPD exacerbation ([place])   Disposition: Discharge  Condition: Good  I have discussed the results, Dx and Tx plan with the patient who expressed understanding and agree(s) with the plan. Discharge instructions discussed at great length. The patient was given strict return precautions who verbalized understanding of the instructions. No further questions at time of discharge.    Discharge Medication List as of 09/18/2016  3:07 PM      Follow Up: Primary care provider  Schedule an appointment as soon as possible for a visit        Yarden Hillis, Grayce Sessions, MD 09/18/16 1603

## 2016-09-18 NOTE — ED Notes (Signed)
O2 STATS FELL TO 88%WHILE WALKING PATIENT AROUND THE EMERGENCY ROOM

## 2016-09-18 NOTE — ED Triage Notes (Signed)
Pt reports bilateral lower leg swelling for the past few weeks that has gradually gotten worse. Is on lasix for this, but it is not helping.  Pt woke up this am with SOB. Hx of COPD, and has an echocardiogram scheduled for September. No CP, dizziness or light headedness.

## 2017-07-31 ENCOUNTER — Emergency Department (HOSPITAL_COMMUNITY): Payer: Medicare Other

## 2017-07-31 ENCOUNTER — Emergency Department (HOSPITAL_COMMUNITY)
Admission: EM | Admit: 2017-07-31 | Discharge: 2017-07-31 | Disposition: A | Discharge status: Home / Self Care | Payer: Medicare Other | Attending: Emergency Medicine | Admitting: Emergency Medicine

## 2017-07-31 ENCOUNTER — Other Ambulatory Visit: Payer: Self-pay

## 2017-07-31 ENCOUNTER — Encounter (HOSPITAL_COMMUNITY): Payer: Self-pay

## 2017-07-31 DIAGNOSIS — R0789 Other chest pain: Secondary | ICD-10-CM | POA: Diagnosis not present

## 2017-07-31 DIAGNOSIS — Z7982 Long term (current) use of aspirin: Secondary | ICD-10-CM | POA: Insufficient documentation

## 2017-07-31 DIAGNOSIS — Z87891 Personal history of nicotine dependence: Secondary | ICD-10-CM | POA: Diagnosis not present

## 2017-07-31 DIAGNOSIS — Z7901 Long term (current) use of anticoagulants: Secondary | ICD-10-CM | POA: Diagnosis not present

## 2017-07-31 DIAGNOSIS — Z79899 Other long term (current) drug therapy: Secondary | ICD-10-CM | POA: Diagnosis not present

## 2017-07-31 DIAGNOSIS — R200 Anesthesia of skin: Secondary | ICD-10-CM | POA: Insufficient documentation

## 2017-07-31 DIAGNOSIS — N183 Chronic kidney disease, stage 3 (moderate): Secondary | ICD-10-CM | POA: Insufficient documentation

## 2017-07-31 DIAGNOSIS — I491 Atrial premature depolarization: Secondary | ICD-10-CM | POA: Diagnosis not present

## 2017-07-31 DIAGNOSIS — R000 Tachycardia, unspecified: Secondary | ICD-10-CM | POA: Diagnosis not present

## 2017-07-31 DIAGNOSIS — I129 Hypertensive chronic kidney disease with stage 1 through stage 4 chronic kidney disease, or unspecified chronic kidney disease: Secondary | ICD-10-CM | POA: Diagnosis not present

## 2017-07-31 DIAGNOSIS — Z85528 Personal history of other malignant neoplasm of kidney: Secondary | ICD-10-CM | POA: Insufficient documentation

## 2017-07-31 DIAGNOSIS — R0902 Hypoxemia: Secondary | ICD-10-CM | POA: Diagnosis not present

## 2017-07-31 DIAGNOSIS — R079 Chest pain, unspecified: Secondary | ICD-10-CM | POA: Diagnosis not present

## 2017-07-31 LAB — CBC
HCT: 41.5 % (ref 39.0–52.0)
Hemoglobin: 13.6 g/dL (ref 13.0–17.0)
MCH: 31.3 pg (ref 26.0–34.0)
MCHC: 32.8 g/dL (ref 30.0–36.0)
MCV: 95.4 fL (ref 78.0–100.0)
PLATELETS: 312 10*3/uL (ref 150–400)
RBC: 4.35 MIL/uL (ref 4.22–5.81)
RDW: 13.2 % (ref 11.5–15.5)
WBC: 8.4 10*3/uL (ref 4.0–10.5)

## 2017-07-31 LAB — COMPREHENSIVE METABOLIC PANEL
ALBUMIN: 3.1 g/dL — AB (ref 3.5–5.0)
ALT: 15 U/L — ABNORMAL LOW (ref 17–63)
ANION GAP: 7 (ref 5–15)
AST: 28 U/L (ref 15–41)
Alkaline Phosphatase: 74 U/L (ref 38–126)
BILIRUBIN TOTAL: 0.8 mg/dL (ref 0.3–1.2)
BUN: 10 mg/dL (ref 6–20)
CO2: 28 mmol/L (ref 22–32)
Calcium: 8.4 mg/dL — ABNORMAL LOW (ref 8.9–10.3)
Chloride: 107 mmol/L (ref 101–111)
Creatinine, Ser: 1.42 mg/dL — ABNORMAL HIGH (ref 0.61–1.24)
GFR calc Af Amer: 52 mL/min — ABNORMAL LOW (ref >60)
GFR calc non Af Amer: 45 mL/min — ABNORMAL LOW (ref >60)
GLUCOSE: 124 mg/dL — AB (ref 65–99)
POTASSIUM: 3.9 mmol/L (ref 3.5–5.1)
SODIUM: 142 mmol/L (ref 135–145)
TOTAL PROTEIN: 5.8 g/dL — AB (ref 6.5–8.1)

## 2017-07-31 LAB — DIFFERENTIAL
Abs Immature Granulocytes: 0 10*3/uL (ref 0.0–0.1)
BASOS ABS: 0.1 10*3/uL (ref 0.0–0.1)
BASOS PCT: 1 %
EOS ABS: 0.3 10*3/uL (ref 0.0–0.7)
EOS PCT: 3 %
IMMATURE GRANULOCYTES: 1 %
Lymphocytes Relative: 20 %
Lymphs Abs: 1.7 10*3/uL (ref 0.7–4.0)
Monocytes Absolute: 0.8 10*3/uL (ref 0.1–1.0)
Monocytes Relative: 9 %
Neutro Abs: 5.5 10*3/uL (ref 1.7–7.7)
Neutrophils Relative %: 66 %

## 2017-07-31 LAB — RAPID URINE DRUG SCREEN, HOSP PERFORMED
Amphetamines: NOT DETECTED
Barbiturates: NOT DETECTED
Benzodiazepines: NOT DETECTED
COCAINE: NOT DETECTED
Opiates: POSITIVE — AB
TETRAHYDROCANNABINOL: NOT DETECTED

## 2017-07-31 LAB — PROTIME-INR
INR: 1.13
PROTHROMBIN TIME: 14.5 s (ref 11.4–15.2)

## 2017-07-31 LAB — URINALYSIS, ROUTINE W REFLEX MICROSCOPIC
Bacteria, UA: NONE SEEN
Bilirubin Urine: NEGATIVE
Glucose, UA: NEGATIVE mg/dL
Hgb urine dipstick: NEGATIVE
KETONES UR: NEGATIVE mg/dL
LEUKOCYTES UA: NEGATIVE
Nitrite: NEGATIVE
PH: 7 (ref 5.0–8.0)
PROTEIN: 100 mg/dL — AB
Specific Gravity, Urine: 1.008 (ref 1.005–1.030)

## 2017-07-31 LAB — I-STAT TROPONIN, ED
TROPONIN I, POC: 0 ng/mL (ref 0.00–0.08)
Troponin i, poc: 0 ng/mL (ref 0.00–0.08)

## 2017-07-31 LAB — APTT: APTT: 34 s (ref 24–36)

## 2017-07-31 MED ORDER — FENTANYL CITRATE (PF) 100 MCG/2ML IJ SOLN
25.0000 ug | Freq: Once | INTRAMUSCULAR | Status: AC
  Administered 2017-07-31: 25 ug via INTRAVENOUS
  Filled 2017-07-31: qty 2

## 2017-07-31 MED ORDER — HYDROCODONE-ACETAMINOPHEN 10-325 MG PO TABS
1.0000 | ORAL_TABLET | Freq: Once | ORAL | Status: AC
  Administered 2017-07-31: 1 via ORAL
  Filled 2017-07-31: qty 1

## 2017-07-31 MED ORDER — GABAPENTIN 300 MG PO CAPS
300.0000 mg | ORAL_CAPSULE | Freq: Once | ORAL | Status: AC
  Administered 2017-07-31: 300 mg via ORAL
  Filled 2017-07-31: qty 1

## 2017-07-31 MED ORDER — SODIUM CHLORIDE 0.9 % IV SOLN
100.0000 mL/h | INTRAVENOUS | Status: DC
  Administered 2017-07-31: 100 mL/h via INTRAVENOUS

## 2017-07-31 MED ORDER — SODIUM CHLORIDE 0.9 % IV BOLUS
500.0000 mL | Freq: Once | INTRAVENOUS | Status: AC
  Administered 2017-07-31: 500 mL via INTRAVENOUS

## 2017-07-31 NOTE — ED Triage Notes (Signed)
Patient at home and felt numbness on left side face, ar, thigh with left sided chest pain.

## 2017-07-31 NOTE — ED Provider Notes (Signed)
[facility] EMERGENCY DEPARTMENT Provider Note   CSN: [id number] Arrival date & time: 07/31/17  1611     History   Chief Complaint Chief Complaint  Patient presents with  . Numbness  . Chest Pain    HPI Wayne Meadows is a 83 y.o. male.  HPI Patient presents with concern of numbness, pain. Onset was about 1 hour ago, in briefly the patient had some apparent speech difficulty, though this has resolved. However, since onset he has had persistent numbness throughout his left upper and lower extremity, without lightheadedness, confusion, disorientation, weakness There is some ongoing pain in the left chest as well, unclear when this started. Patient has a history of prior stroke, states that he takes Plavix as directed, and was doing well until 1 hour ago. Since onset no clear alleviating or exacerbating factors.  History provided by patient and EMS, note the patient was awake and alert, hemodynamically unremarkable in route.  Past Medical History:  Diagnosis Date  . Arthritis   . Carotid stenosis   . COPD (chronic obstructive pulmonary disease) ([place])   . Coronary artery disease    S/p PCI 2011;  NSTEMI 12/12:  LHC/PCI 02/23/11: LAD 60% after the septal perforator, D1 occluded with distal collaterals, proximal RI 30-40%, AV circumflex stent patent with 60% stenosis after the stent, RCA 99%, EF 60-65%.  His RCA was treated with a bare-metal stent  . CVA (cerebral infarction) 2011   Right cerebral; total obstruction of the right ICA  . Diverticulitis   . Hypertension   . Rectal bleeding 07/2015  . Renal carcinoma ([place])   . Tobacco abuse, in remission     Patient Active Problem List   Diagnosis Date Noted  . Sepsis, unspecified organism ([place]) 04/11/2016  . COPD exacerbation ([place]) 04/11/2016  . CKD (chronic kidney disease), stage III ([place]) 04/11/2016  . Hyperbilirubinemia 04/11/2016  . Elevated lactic acid level   . Renal insufficiency   . Segmental  colitis ([place]) 09/04/2015  . Generalized abdominal pain 09/04/2015  . Noninfectious gastroenteritis, unspecified   . Benign neoplasm of transverse colon   . Benign neoplasm of colon   . Bright red blood per rectum   . Acute blood loss anemia   . Dysphagia   . Renal cell cancer ([place])   . Essential hypertension   . Centrilobular emphysema ([place])   . Blood in stool 08/19/2015  . Rectal bleeding 08/19/2015  . Chest pain 04/08/2014  . CAP (community acquired pneumonia) 04/08/2014  . Community acquired pneumonia of left lower lobe of lung ([place]) 04/08/2014  . Overweight (BMI 25.0-29.9) 04/08/2014  . Healthcare-associated pneumonia 10/26/2011  . Cholelithiasis 04/26/2011  . Atrial fibrillation ([place]) 04/26/2011  . Chest pain 03/19/2011  . Hypertension 03/19/2011  . Unstable angina ([place]) 03/18/2011    Class: Acute  . Lung nodule 03/02/2011  . Liver lesion 03/02/2011  . HLD (hyperlipidemia) 03/02/2011  . Abnormal abdominal CT scan 02/23/2011  . Non Q wave myocardial infarction ([place]) 02/22/2011    Class: Acute  . Cerebrovascular disease 02/22/2011  . Coronary artery disease due to lipid rich plaque 02/22/2011  . COPD (chronic obstructive pulmonary disease) ([place]) 02/22/2011  . Tobacco abuse, 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/22/2011    Past Surgical History:  Procedure Laterality Date  . COLON SURGERY    . COLONOSCOPY N/A 08/22/2015   Procedure: COLONOSCOPY;  Surgeon: [name], MD;  Location: [facility];  Service: Endoscopy;  Laterality: N/A;  . hip relacement    .  KIDNEY SURGERY    . LEFT HEART CATHETERIZATION WITH CORONARY ANGIOGRAM N/A 02/23/2011   Procedure: LEFT HEART CATHETERIZATION WITH CORONARY ANGIOGRAM;  Surgeon: [name], MD;  Location: [facility] CATH LAB;  Service: Cardiovascular;  Laterality: N/A;  . LEFT HEART CATHETERIZATION WITH CORONARY ANGIOGRAM N/A 03/18/2011   Procedure: LEFT HEART CATHETERIZATION WITH CORONARY ANGIOGRAM;  Surgeon: [name], MD;  Location: [facility] CATH  LAB;  Service: Cardiovascular;  Laterality: N/A;  . PERCUTANEOUS CORONARY STENT INTERVENTION (PCI-S) N/A 02/23/2011   Procedure: PERCUTANEOUS CORONARY STENT INTERVENTION (PCI-S);  Surgeon: [name], MD;  Location: [facility] CATH LAB;  Service: Cardiovascular;  Laterality: N/A;  . TEMPORARY PACEMAKER INSERTION N/A 02/23/2011   Procedure: TEMPORARY PACEMAKER INSERTION;  Surgeon: [name], MD;  Location: [facility] CATH LAB;  Service: Cardiovascular;  Laterality: N/A;        Home Medications    Prior to Admission medications   Medication Sig Start Date End Date Taking? Authorizing Provider  amLODipine (NORVASC) 5 MG tablet Take 5 mg by mouth daily.   Yes [provider]  aspirin EC 81 MG tablet Take 81 mg by mouth daily.     Yes [provider]  baclofen (LIORESAL) 10 MG tablet Take 10 mg by mouth at bedtime.    Yes [provider]  clopidogrel (PLAVIX) 75 MG tablet Take 75 mg by mouth daily.   Yes [provider]  enalapril (VASOTEC) 20 MG tablet Take 20 mg by mouth at bedtime.    Yes [provider]  EPINEPHrine (EPI-PEN) 0.3 mg/0.3 mL DEVI Inject 0.3 mg into the muscle once as needed (for anaphylactic reaction).    Yes [provider]  furosemide (LASIX) 40 MG tablet Take 40 mg by mouth 2 (two) times daily.   Yes [provider]  gabapentin (NEURONTIN) 300 MG capsule Take 300-600 mg by mouth See admin instructions. Take 300 mg by mouth in the morning then 300 mg at noon then 600 mg at bedtime   Yes [provider]  HYDROcodone-acetaminophen (NORCO) 10-325 MG tablet Take 1 tablet by mouth 2 (two) times daily.    Yes [provider]  Ipratropium-Albuterol (COMBIVENT) 20-100 MCG/ACT AERS respimat Inhale 1 puff into the lungs 4 (four) times daily.   Yes [provider]  Melatonin 3 MG TABS Take 6 mg by mouth at bedtime.   Yes [provider]  metoprolol tartrate (LOPRESSOR) 25 MG tablet Take 12.5 mg  by mouth 2 (two) times daily.   Yes [provider]  mometasone (ASMANEX) 220 MCG/INH inhaler Inhale 1 puff into the lungs at bedtime.     Yes [provider]  Multiple Vitamin (MULTIVITAMIN) tablet Take 1 tablet by mouth daily.   Yes [provider]  nitroGLYCERIN (NITROSTAT) 0.4 MG SL tablet Place 0.4 mg under the tongue every 5 (five) minutes as needed for chest pain.   Yes [provider]  pantoprazole (PROTONIX) 20 MG tablet Take 1 tablet (20 mg total) by mouth 2 (two) times daily before a meal. 08/09/15  Yes Pollina, Gwenyth Allegra, MD  polyethylene glycol (MIRALAX / GLYCOLAX) packet Take 17 g by mouth daily. Patient taking differently: Take 17 g by mouth daily as needed for mild constipation or moderate constipation.  08/22/15  Yes Lavina Hamman, MD  pravastatin (PRAVACHOL) 40 MG tablet Take 40 mg by mouth at bedtime.   Yes [provider]  azithromycin (ZITHROMAX) 250 MG tablet Take 1 tablet (250 mg total) by  mouth daily. Take first 2 tablets together, then 1 every day until finished. Patient not taking: Reported on 07/31/2017 07/08/16   Rolland Porter, MD  MESALAMINE PO Take 1,500 mg by mouth daily.    [provider]    Family History Family History  Problem Relation Age of Onset  . Heart attack Unknown 8    Social History Social History   Tobacco Use  . Smoking status: Former Smoker    Types: Cigarettes    Last attempt to quit: 02/24/2007    Years since quitting: 10.4  . Smokeless tobacco: Never Used  Substance Use Topics  . Alcohol use: Yes    Alcohol/week: 0.6 oz    Types: 1 Standard drinks or equivalent per week    Comment: socially   . Drug use: No     Allergies   Bee venom and Influenza vaccines   Review of Systems Review of Systems  Constitutional:       Per HPI, otherwise negative  HENT:       Per HPI, otherwise negative  Respiratory:       Per HPI, otherwise negative  Cardiovascular:       Per HPI,  otherwise negative  Gastrointestinal: Negative for vomiting.  Endocrine:       Negative aside from HPI  Genitourinary:       Neg aside from HPI   Musculoskeletal:       Per HPI, otherwise negative  Skin: Negative.   Neurological: Positive for numbness. Negative for syncope.     Physical Exam Updated Vital Signs BP (!) 162/69   Pulse 70   Temp 99 F (37.2 C) (Oral)   Resp 13   Ht 6\' 2"  (1.88 m)   Wt 100.2 kg (221 lb)   SpO2 95%   BMI 28.37 kg/m   Physical Exam  Constitutional: He is oriented to person, place, and time. He appears well-developed. No distress.  HENT:  Head: Normocephalic and atraumatic.  Eyes: Conjunctivae and EOM are normal.  Cardiovascular: Normal rate and regular rhythm.  Pulmonary/Chest: Effort normal. No stridor. No respiratory distress.  Abdominal: He exhibits no distension.  Musculoskeletal: He exhibits no edema.  Neurological: He is alert and oriented to person, place, and time. He displays no atrophy and no tremor. No cranial nerve deficit or sensory deficit. He exhibits normal muscle tone. He displays no seizure activity. Coordination normal.  Skin: Skin is warm and dry.  Psychiatric: He has a normal mood and affect.  Nursing note and vitals reviewed.    ED Treatments / Results  Labs (all labs ordered are listed, but only abnormal results are displayed) Labs Reviewed  COMPREHENSIVE METABOLIC PANEL - Abnormal; Notable for the following components:      Result Value   Glucose, Bld 124 (*)    Creatinine, Ser 1.42 (*)    Calcium 8.4 (*)    Total Protein 5.8 (*)    Albumin 3.1 (*)    ALT 15 (*)    GFR calc non Af Amer 45 (*)    GFR calc Af Amer 52 (*)    All other components within normal limits  URINALYSIS, ROUTINE W REFLEX MICROSCOPIC - Abnormal; Notable for the following components:   Color, Urine STRAW (*)    Protein, ur 100 (*)    All other components within normal limits  PROTIME-INR  APTT  CBC  DIFFERENTIAL  RAPID URINE DRUG  SCREEN, HOSP PERFORMED  I-STAT TROPONIN, ED  I-STAT TROPONIN, ED  EKG None  Radiology Dg Chest 2 View  Result Date: 07/31/2017 CLINICAL DATA:  Left facial numbness as well as numbness left arm, chest and leg. EXAM: CHEST - 2 VIEW COMPARISON:  09/18/2016 FINDINGS: Lungs are adequately inflated with stable tenting of the right hemidiaphragm. No focal airspace consolidation or effusion. Flattening of the hemidiaphragms. Cardiomediastinal silhouette and remainder of the exam is unchanged. IMPRESSION: No acute cardiopulmonary disease. Electronically Signed   By: Marin Olp M.D.   On: 07/31/2017 20:34   Ct Head Wo Contrast  Result Date: 07/31/2017 CLINICAL DATA:  Unexplained altered level consciousness. Left-sided facial numbness. EXAM: CT HEAD WITHOUT CONTRAST TECHNIQUE: Contiguous axial images were obtained from the base of the skull through the vertex without intravenous contrast. COMPARISON:  08/09/2015 FINDINGS: Brain: No evidence of acute infarction, hemorrhage, hydrocephalus, extra-axial collection, or mass lesion/mass effect. Old right parietal lobe infarct is unchanged. Stable mild diffuse cerebral atrophy. Vascular:  No hyperdense vessel or other acute findings. Skull: No evidence of fracture or other significant bone abnormality. Sinuses/Orbits:  No acute findings. Other: None. IMPRESSION: No acute intracranial abnormality. Stable mild diffuse cerebral atrophy and old right parietal infarct. Electronically Signed   By: Earle Gell M.D.   On: 07/31/2017 18:07    Procedures Procedures (including critical care time)  Medications Ordered in ED Medications  sodium chloride 0.9 % bolus 500 mL (0 mLs Intravenous Stopped 07/31/17 1900)    Followed by  0.9 %  sodium chloride infusion (100 mL/hr Intravenous New Bag/Given 07/31/17 2027)  fentaNYL (SUBLIMAZE) injection 25 mcg (has no administration in time range)  HYDROcodone-acetaminophen (NORCO) 10-325 MG per tablet 1 tablet (1 tablet Oral Given  07/31/17 2027)  gabapentin (NEURONTIN) capsule 300 mg (300 mg Oral Given 07/31/17 2027)     Initial Impression / Assessment and Plan / ED Course  I have reviewed the triage vital signs and the nursing notes.  Pertinent labs & imaging results that were available during my care of the patient were reviewed by me and considered in my medical decision making (see chart for details).     Date on repeat exam the patient is awake and alert, continues to complain of some pain on his left axilla, multiple family members are not present and we discussed his presentation per The all agreed that the patient is interacting in a typical manner, has normal speech, clear, no facial asymmetry, and appears in his usual state of health. -Troponin unremarkable, initial labs unremarkable, patient awaiting second troponin, chest x-ray.  10:22 PM Patient awake and alert, notes that his left-sided chest pain is intermittent, occasionally in the chest wall, occasionally in the left arm, not consistent, and has not gotten any worse since arrival, and he does have some ongoing back pain from sitting in the bed for several hours, consistent with his prior back pain. Patient has received both gabapentin and Norco, typical home medications for him. He continues to exhibit no respiratory distress, no other chest pain anteriorly, no lightheadedness, no syncope, no confusion. We had a lengthy conversation with his 2 family members present about all findings including 2 normal troponin, nonischemic EKG, reassuring CT scan, low suspicion for stroke given his reassuring physical exam, no evidence for ACS. Patient is already on aspirin and Plavix, maximal medical management for minimizing stroke risk, MI. We discussed admission for pain management and monitoring versus discharge with close outpatient follow-up, and the patient had a strong preference for this latter option. Given  the evidence for other acute  new pathology, with his  unremarkable hemodynamic status, differential reassuring findings, discussion, the patient was discharged in stable condition with outpatient follow-up in 2 days. Final Clinical Impressions(s) / ED Diagnoses   Final diagnoses:  Atypical chest pain  Numbness     Carmin Muskrat, MD 07/31/17 2223

## 2017-07-31 NOTE — Discharge Instructions (Addendum)
As discussed, your evaluation today has been largely reassuring.  But, it is important that you monitor your condition carefully, and do not hesitate to return to the ED if you develop new, or concerning changes in your condition. ? ?Otherwise, please follow-up with your physician for appropriate ongoing care. ? ?

## 2017-07-31 NOTE — ED Notes (Signed)
Patient walked to bathroom with no ill effects.

## 2018-09-26 ENCOUNTER — Other Ambulatory Visit: Payer: Self-pay

## 2020-02-06 DIAGNOSIS — I10 Essential (primary) hypertension: Secondary | ICD-10-CM | POA: Diagnosis not present

## 2020-02-06 DIAGNOSIS — R0902 Hypoxemia: Secondary | ICD-10-CM | POA: Diagnosis not present

## 2020-02-06 DIAGNOSIS — M25572 Pain in left ankle and joints of left foot: Secondary | ICD-10-CM | POA: Diagnosis not present

## 2020-02-06 DIAGNOSIS — M25551 Pain in right hip: Secondary | ICD-10-CM | POA: Diagnosis not present

## 2020-02-06 DIAGNOSIS — R52 Pain, unspecified: Secondary | ICD-10-CM | POA: Diagnosis not present

## 2020-06-20 ENCOUNTER — Inpatient Hospital Stay (HOSPITAL_COMMUNITY)
Admission: EM | Admit: 2020-06-20 | Discharge: 2020-06-23 | DRG: 392 | Disposition: A | Discharge status: Home / Self Care | Payer: No Typology Code available for payment source | Attending: Student in an Organized Health Care Education/Training Program | Admitting: Student in an Organized Health Care Education/Training Program

## 2020-06-20 ENCOUNTER — Encounter (HOSPITAL_COMMUNITY): Payer: Self-pay

## 2020-06-20 ENCOUNTER — Emergency Department (HOSPITAL_COMMUNITY): Payer: No Typology Code available for payment source

## 2020-06-20 DIAGNOSIS — K5720 Diverticulitis of large intestine with perforation and abscess without bleeding: Secondary | ICD-10-CM | POA: Diagnosis present

## 2020-06-20 DIAGNOSIS — J449 Chronic obstructive pulmonary disease, unspecified: Secondary | ICD-10-CM | POA: Diagnosis present

## 2020-06-20 DIAGNOSIS — Z6825 Body mass index (BMI) 25.0-25.9, adult: Secondary | ICD-10-CM

## 2020-06-20 DIAGNOSIS — R112 Nausea with vomiting, unspecified: Secondary | ICD-10-CM | POA: Diagnosis not present

## 2020-06-20 DIAGNOSIS — Z87891 Personal history of nicotine dependence: Secondary | ICD-10-CM

## 2020-06-20 DIAGNOSIS — R0789 Other chest pain: Secondary | ICD-10-CM | POA: Diagnosis present

## 2020-06-20 DIAGNOSIS — E860 Dehydration: Secondary | ICD-10-CM | POA: Diagnosis present

## 2020-06-20 DIAGNOSIS — N2889 Other specified disorders of kidney and ureter: Secondary | ICD-10-CM | POA: Diagnosis present

## 2020-06-20 DIAGNOSIS — Z8249 Family history of ischemic heart disease and other diseases of the circulatory system: Secondary | ICD-10-CM

## 2020-06-20 DIAGNOSIS — R634 Abnormal weight loss: Secondary | ICD-10-CM | POA: Diagnosis present

## 2020-06-20 DIAGNOSIS — K449 Diaphragmatic hernia without obstruction or gangrene: Secondary | ICD-10-CM | POA: Diagnosis present

## 2020-06-20 DIAGNOSIS — D649 Anemia, unspecified: Secondary | ICD-10-CM | POA: Diagnosis present

## 2020-06-20 DIAGNOSIS — R1010 Upper abdominal pain, unspecified: Secondary | ICD-10-CM

## 2020-06-20 DIAGNOSIS — Z7982 Long term (current) use of aspirin: Secondary | ICD-10-CM

## 2020-06-20 DIAGNOSIS — Z85528 Personal history of other malignant neoplasm of kidney: Secondary | ICD-10-CM

## 2020-06-20 DIAGNOSIS — Z955 Presence of coronary angioplasty implant and graft: Secondary | ICD-10-CM

## 2020-06-20 DIAGNOSIS — K319 Disease of stomach and duodenum, unspecified: Secondary | ICD-10-CM

## 2020-06-20 DIAGNOSIS — N179 Acute kidney failure, unspecified: Secondary | ICD-10-CM | POA: Diagnosis not present

## 2020-06-20 DIAGNOSIS — K219 Gastro-esophageal reflux disease without esophagitis: Secondary | ICD-10-CM | POA: Diagnosis present

## 2020-06-20 DIAGNOSIS — R1114 Bilious vomiting: Secondary | ICD-10-CM

## 2020-06-20 DIAGNOSIS — Z7902 Long term (current) use of antithrombotics/antiplatelets: Secondary | ICD-10-CM

## 2020-06-20 DIAGNOSIS — Z20822 Contact with and (suspected) exposure to covid-19: Secondary | ICD-10-CM | POA: Diagnosis present

## 2020-06-20 DIAGNOSIS — I252 Old myocardial infarction: Secondary | ICD-10-CM

## 2020-06-20 DIAGNOSIS — Z79899 Other long term (current) drug therapy: Secondary | ICD-10-CM

## 2020-06-20 DIAGNOSIS — I129 Hypertensive chronic kidney disease with stage 1 through stage 4 chronic kidney disease, or unspecified chronic kidney disease: Secondary | ICD-10-CM | POA: Diagnosis present

## 2020-06-20 DIAGNOSIS — I2510 Atherosclerotic heart disease of native coronary artery without angina pectoris: Secondary | ICD-10-CM | POA: Diagnosis present

## 2020-06-20 DIAGNOSIS — Z91030 Bee allergy status: Secondary | ICD-10-CM

## 2020-06-20 DIAGNOSIS — R079 Chest pain, unspecified: Secondary | ICD-10-CM

## 2020-06-20 DIAGNOSIS — K222 Esophageal obstruction: Secondary | ICD-10-CM | POA: Diagnosis present

## 2020-06-20 DIAGNOSIS — N1832 Chronic kidney disease, stage 3b: Secondary | ICD-10-CM | POA: Diagnosis present

## 2020-06-20 DIAGNOSIS — R935 Abnormal findings on diagnostic imaging of other abdominal regions, including retroperitoneum: Secondary | ICD-10-CM | POA: Diagnosis not present

## 2020-06-20 DIAGNOSIS — Z888 Allergy status to other drugs, medicaments and biological substances status: Secondary | ICD-10-CM

## 2020-06-20 DIAGNOSIS — Z85820 Personal history of malignant melanoma of skin: Secondary | ICD-10-CM

## 2020-06-20 DIAGNOSIS — Z8673 Personal history of transient ischemic attack (TIA), and cerebral infarction without residual deficits: Secondary | ICD-10-CM

## 2020-06-20 HISTORY — DX: Nausea with vomiting, unspecified: R11.2

## 2020-06-20 LAB — SARS CORONAVIRUS 2 (TAT 6-24 HRS): SARS Coronavirus 2: NEGATIVE

## 2020-06-20 LAB — CBC WITH DIFFERENTIAL/PLATELET
Abs Immature Granulocytes: 0.09 10*3/uL — ABNORMAL HIGH (ref 0.00–0.07)
Basophils Absolute: 0 10*3/uL (ref 0.0–0.1)
Basophils Relative: 0 %
Eosinophils Absolute: 0 10*3/uL (ref 0.0–0.5)
Eosinophils Relative: 0 %
HCT: 33.8 % — ABNORMAL LOW (ref 39.0–52.0)
Hemoglobin: 10.7 g/dL — ABNORMAL LOW (ref 13.0–17.0)
Immature Granulocytes: 1 %
Lymphocytes Relative: 7 %
Lymphs Abs: 1 10*3/uL (ref 0.7–4.0)
MCH: 29.6 pg (ref 26.0–34.0)
MCHC: 31.7 g/dL (ref 30.0–36.0)
MCV: 93.6 fL (ref 80.0–100.0)
Monocytes Absolute: 1.1 10*3/uL — ABNORMAL HIGH (ref 0.1–1.0)
Monocytes Relative: 8 %
Neutro Abs: 12.5 10*3/uL — ABNORMAL HIGH (ref 1.7–7.7)
Neutrophils Relative %: 84 %
Platelets: 421 10*3/uL — ABNORMAL HIGH (ref 150–400)
RBC: 3.61 MIL/uL — ABNORMAL LOW (ref 4.22–5.81)
RDW: 13.2 % (ref 11.5–15.5)
WBC: 14.8 10*3/uL — ABNORMAL HIGH (ref 4.0–10.5)
nRBC: 0 % (ref 0.0–0.2)

## 2020-06-20 LAB — COMPREHENSIVE METABOLIC PANEL WITH GFR
ALT: 14 U/L (ref 0–44)
AST: 23 U/L (ref 15–41)
Albumin: 2.1 g/dL — ABNORMAL LOW (ref 3.5–5.0)
Alkaline Phosphatase: 82 U/L (ref 38–126)
Anion gap: 9 (ref 5–15)
BUN: 10 mg/dL (ref 8–23)
CO2: 24 mmol/L (ref 22–32)
Calcium: 8.5 mg/dL — ABNORMAL LOW (ref 8.9–10.3)
Chloride: 104 mmol/L (ref 98–111)
Creatinine, Ser: 2.1 mg/dL — ABNORMAL HIGH (ref 0.61–1.24)
GFR, Estimated: 31 mL/min — ABNORMAL LOW
Glucose, Bld: 145 mg/dL — ABNORMAL HIGH (ref 70–99)
Potassium: 3.4 mmol/L — ABNORMAL LOW (ref 3.5–5.1)
Sodium: 137 mmol/L (ref 135–145)
Total Bilirubin: 0.8 mg/dL (ref 0.3–1.2)
Total Protein: 6.8 g/dL (ref 6.5–8.1)

## 2020-06-20 LAB — URINALYSIS, ROUTINE W REFLEX MICROSCOPIC
Bacteria, UA: NONE SEEN
Bilirubin Urine: NEGATIVE
Glucose, UA: 50 mg/dL — AB
Ketones, ur: 5 mg/dL — AB
Leukocytes,Ua: NEGATIVE
Nitrite: NEGATIVE
Protein, ur: >=300 mg/dL — AB
Specific Gravity, Urine: 1.013 (ref 1.005–1.030)
pH: 7 (ref 5.0–8.0)

## 2020-06-20 LAB — LIPASE, BLOOD: Lipase: 51 U/L (ref 11–51)

## 2020-06-20 MED ORDER — HYDROCODONE-ACETAMINOPHEN 10-325 MG PO TABS
1.0000 | ORAL_TABLET | ORAL | Status: DC | PRN
Start: 2020-06-20 — End: 2020-06-24
  Administered 2020-06-22 – 2020-06-23 (×4): 1 via ORAL
  Filled 2020-06-20 (×5): qty 1

## 2020-06-20 MED ORDER — HYDROCODONE-ACETAMINOPHEN 10-325 MG PO TABS: 1.0000 | ORAL_TABLET | Freq: Two times a day (BID) | ORAL | Status: DC | PRN

## 2020-06-20 MED ORDER — PANTOPRAZOLE SODIUM 40 MG IV SOLR
40.0000 mg | Freq: Two times a day (BID) | INTRAVENOUS | Status: DC
  Administered 2020-06-20 – 2020-06-22 (×4): 40 mg via INTRAVENOUS
  Filled 2020-06-20 (×4): qty 40

## 2020-06-20 MED ORDER — ONDANSETRON HCL 4 MG/2ML IJ SOLN
4.0000 mg | Freq: Once | INTRAMUSCULAR | Status: AC
  Administered 2020-06-20: 4 mg via INTRAVENOUS
  Filled 2020-06-20: qty 2

## 2020-06-20 MED ORDER — CLONIDINE HCL 0.1 MG PO TABS
0.1000 mg | ORAL_TABLET | Freq: Every day | ORAL | Status: DC
  Administered 2020-06-20 – 2020-06-23 (×4): 0.1 mg via ORAL
  Filled 2020-06-20 (×4): qty 1

## 2020-06-20 MED ORDER — METOPROLOL TARTRATE 25 MG PO TABS
25.0000 mg | ORAL_TABLET | Freq: Every day | ORAL | Status: DC
  Administered 2020-06-21 – 2020-06-23 (×3): 25 mg via ORAL
  Filled 2020-06-20 (×3): qty 1

## 2020-06-20 MED ORDER — ONDANSETRON HCL 4 MG PO TABS: 4.0000 mg | ORAL_TABLET | Freq: Four times a day (QID) | ORAL | Status: DC | PRN

## 2020-06-20 MED ORDER — MOMETASONE FURO-FORMOTEROL FUM 100-5 MCG/ACT IN AERO
2.0000 | INHALATION_SPRAY | Freq: Two times a day (BID) | RESPIRATORY_TRACT | Status: DC
  Administered 2020-06-20 – 2020-06-23 (×7): 2 via RESPIRATORY_TRACT
  Filled 2020-06-20: qty 8.8

## 2020-06-20 MED ORDER — ENALAPRIL MALEATE 20 MG PO TABS
20.0000 mg | ORAL_TABLET | Freq: Two times a day (BID) | ORAL | Status: DC
  Filled 2020-06-20: qty 1

## 2020-06-20 MED ORDER — IOHEXOL 9 MG/ML PO SOLN
ORAL | Status: AC
  Filled 2020-06-20: qty 1000

## 2020-06-20 MED ORDER — ALBUTEROL SULFATE HFA 108 (90 BASE) MCG/ACT IN AERS
4.0000 | INHALATION_SPRAY | Freq: Once | RESPIRATORY_TRACT | Status: AC
  Administered 2020-06-20: 4 via RESPIRATORY_TRACT
  Filled 2020-06-20: qty 6.7

## 2020-06-20 MED ORDER — METOCLOPRAMIDE HCL 5 MG/ML IJ SOLN
10.0000 mg | Freq: Once | INTRAMUSCULAR | Status: AC
  Administered 2020-06-20: 10 mg via INTRAVENOUS
  Filled 2020-06-20: qty 2

## 2020-06-20 MED ORDER — ONDANSETRON HCL 4 MG/2ML IJ SOLN: 4.0000 mg | Freq: Four times a day (QID) | INTRAMUSCULAR | Status: DC | PRN

## 2020-06-20 MED ORDER — BACLOFEN 10 MG PO TABS
10.0000 mg | ORAL_TABLET | Freq: Every day | ORAL | Status: DC
  Administered 2020-06-20 – 2020-06-23 (×5): 10 mg via ORAL
  Filled 2020-06-20 (×5): qty 1

## 2020-06-20 MED ORDER — LEVOTHYROXINE SODIUM 75 MCG PO TABS
75.0000 ug | ORAL_TABLET | Freq: Every day | ORAL | Status: DC
  Administered 2020-06-22 – 2020-06-23 (×2): 75 ug via ORAL
  Filled 2020-06-20 (×2): qty 1

## 2020-06-20 MED ORDER — PRAVASTATIN SODIUM 40 MG PO TABS
40.0000 mg | ORAL_TABLET | Freq: Every day | ORAL | Status: DC
  Administered 2020-06-20 – 2020-06-23 (×4): 40 mg via ORAL
  Filled 2020-06-20 (×4): qty 1

## 2020-06-20 MED ORDER — SODIUM CHLORIDE 0.9 % IV BOLUS
500.0000 mL | Freq: Once | INTRAVENOUS | Status: AC
  Administered 2020-06-20: 500 mL via INTRAVENOUS

## 2020-06-20 MED ORDER — ALBUTEROL SULFATE HFA 108 (90 BASE) MCG/ACT IN AERS
1.0000 | INHALATION_SPRAY | Freq: Four times a day (QID) | RESPIRATORY_TRACT | Status: DC | PRN
  Filled 2020-06-20: qty 6.7

## 2020-06-20 MED ORDER — ENALAPRIL MALEATE 20 MG PO TABS
20.0000 mg | ORAL_TABLET | Freq: Every day | ORAL | Status: DC
  Filled 2020-06-20: qty 1

## 2020-06-20 MED ORDER — AMLODIPINE BESYLATE 5 MG PO TABS
5.0000 mg | ORAL_TABLET | Freq: Every day | ORAL | Status: DC
  Administered 2020-06-21 – 2020-06-23 (×3): 5 mg via ORAL
  Filled 2020-06-20 (×3): qty 1

## 2020-06-20 MED ORDER — GABAPENTIN 300 MG PO CAPS
300.0000 mg | ORAL_CAPSULE | Freq: Two times a day (BID) | ORAL | Status: DC
  Administered 2020-06-22 – 2020-06-23 (×4): 300 mg via ORAL
  Filled 2020-06-20 (×5): qty 1

## 2020-06-20 MED ORDER — GABAPENTIN 300 MG PO CAPS
600.0000 mg | ORAL_CAPSULE | Freq: Every day | ORAL | Status: DC
  Administered 2020-06-20 – 2020-06-23 (×4): 600 mg via ORAL
  Filled 2020-06-20 (×4): qty 2

## 2020-06-20 MED ORDER — SODIUM CHLORIDE 0.9 % IV SOLN: INTRAVENOUS | Status: DC

## 2020-06-20 MED ORDER — ENALAPRIL MALEATE 5 MG PO TABS
20.0000 mg | ORAL_TABLET | Freq: Two times a day (BID) | ORAL | Status: DC
  Administered 2020-06-21 – 2020-06-23 (×6): 20 mg via ORAL
  Filled 2020-06-20: qty 1
  Filled 2020-06-20 (×6): qty 4

## 2020-06-20 MED ORDER — IPRATROPIUM-ALBUTEROL 0.5-2.5 (3) MG/3ML IN SOLN
3.0000 mL | Freq: Four times a day (QID) | RESPIRATORY_TRACT | Status: DC
  Administered 2020-06-20: 3 mL via RESPIRATORY_TRACT
  Filled 2020-06-20 (×2): qty 3

## 2020-06-20 NOTE — ED Triage Notes (Signed)
Nausea, vomiting and dehydration x 2 days. +orthostatic VS.

## 2020-06-20 NOTE — ED Notes (Signed)
Pt reports worsening nausea again at this time. MD notified.

## 2020-06-20 NOTE — ED Provider Notes (Signed)
[facility] EMERGENCY DEPARTMENT Provider Note   CSN: [id number] Arrival date & time: 06/20/20  1030     History Chief Complaint  Patient presents with  . Nausea  . Dehydration    Wayne Meadows is a 86 y.o. male.  HPI Patient presents via EMS with abdominal pain, nausea, vomiting. History is obtained by the patient and EMS providers. Patient has multiple medical issues, but was seemingly in his usual state of health until about 3 days ago.  After developing abdominal distention, but not necessarily pain he subsequently began having episodes of nausea, vomiting.  Since that time he has had progressive weakness, anorexia, nausea, vomiting.  No clear change in bowel movements. Pain is currently mostly associated with retching, left-sided upper abdomen. EMS report some increased tachycardia with upright positioning, but no hypotension in route. He did receive fluids, Zofran in route, without appreciable change in his condition.     Past Medical History:  Diagnosis Date  . Arthritis   . Carotid stenosis   . COPD (chronic obstructive pulmonary disease) ([place])   . Coronary artery disease    S/p PCI 2011;  NSTEMI 12/12:  LHC/PCI 02/23/11: LAD 60% after the septal perforator, D1 occluded with distal collaterals, proximal RI 30-40%, AV circumflex stent patent with 60% stenosis after the stent, RCA 99%, EF 60-65%.  His RCA was treated with a bare-metal stent  . CVA (cerebral infarction) 2011   Right cerebral; total obstruction of the right ICA  . Diverticulitis   . Hypertension   . Rectal bleeding 07/2015  . Renal carcinoma ([place])   . Stroke ([place])   . Tobacco abuse, in remission     Patient Active Problem List   Diagnosis Date Noted  . Sepsis, unspecified organism ([place]) 04/11/2016  . COPD exacerbation ([place]) 04/11/2016  . CKD (chronic kidney disease), stage III ([place]) 04/11/2016  . Hyperbilirubinemia 04/11/2016  . Elevated lactic acid level   . Renal  insufficiency   . Segmental colitis ([place]) 09/04/2015  . Generalized abdominal pain 09/04/2015  . Noninfectious gastroenteritis, unspecified   . Benign neoplasm of transverse colon   . Benign neoplasm of colon   . Bright red blood per rectum   . Acute blood loss anemia   . Dysphagia   . Renal cell cancer ([place])   . Essential hypertension   . Centrilobular emphysema ([place])   . Blood in stool 08/19/2015  . Rectal bleeding 08/19/2015  . Chest pain 04/08/2014  . CAP (community acquired pneumonia) 04/08/2014  . Community acquired pneumonia of left lower lobe of lung 04/08/2014  . Overweight (BMI 25.0-29.9) 04/08/2014  . Healthcare-associated pneumonia 10/26/2011  . Cholelithiasis 04/26/2011  . Atrial fibrillation ([place]) 04/26/2011  . Chest pain 03/19/2011  . Hypertension 03/19/2011  . Unstable angina ([place]) 03/18/2011    Class: Acute  . Lung nodule 03/02/2011  . Liver lesion 03/02/2011  . HLD (hyperlipidemia) 03/02/2011  . Abnormal abdominal CT scan 02/23/2011  . Non Q wave myocardial infarction ([place]) 02/22/2011    Class: Acute  . Cerebrovascular disease 02/22/2011  . Coronary artery disease due to lipid rich plaque 02/22/2011  . COPD (chronic obstructive pulmonary disease) ([place]) 02/22/2011  . Tobacco abuse, 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/22/2011    Past Surgical History:  Procedure Laterality Date  . COLON SURGERY    . COLONOSCOPY N/A 08/22/2015   Procedure: COLONOSCOPY;  Surgeon: [name], MD;  Location: [facility] END[facility];  Service: Endoscopy;  Laterality: N/A;  . hip relacement    .  KIDNEY SURGERY    . LEFT HEART CATHETERIZATION WITH CORONARY ANGIOGRAM N/A 02/23/2011   Procedure: LEFT HEART CATHETERIZATION WITH CORONARY ANGIOGRAM;  Surgeon: [name], MD;  Location: [facility] CATH LAB;  Service: Cardiovascular;  Laterality: N/A;  . LEFT HEART CATHETERIZATION WITH CORONARY ANGIOGRAM N/A 03/18/2011   Procedure: LEFT HEART CATHETERIZATION WITH CORONARY ANGIOGRAM;  Surgeon: [name], MD;   Location: [facility] CATH LAB;  Service: Cardiovascular;  Laterality: N/A;  . PERCUTANEOUS CORONARY STENT INTERVENTION (PCI-S) N/A 02/23/2011   Procedure: PERCUTANEOUS CORONARY STENT INTERVENTION (PCI-S);  Surgeon: [name], MD;  Location: [facility] CATH LAB;  Service: Cardiovascular;  Laterality: N/A;  . TEMPORARY PACEMAKER INSERTION N/A 02/23/2011   Procedure: TEMPORARY PACEMAKER INSERTION;  Surgeon: [name], MD;  Location: [facility] CATH LAB;  Service: Cardiovascular;  Laterality: N/A;       Family History  Problem Relation Age of Onset  . Heart attack Other 45    Social History   Tobacco Use  . Smoking status: Former Smoker    Types: Cigarettes    Quit date: 02/24/2007    Years since quitting: 13.3  . Smokeless tobacco: Never Used  Substance Use Topics  . Alcohol use: Not Currently    Alcohol/week: 1.0 standard drink    Types: 1 Standard drinks or equivalent per week    Comment: socially   . Drug use: No    Home Medications Prior to Admission medications   Medication Sig Start Date End Date Taking? Authorizing Provider  amLODipine (NORVASC) 5 MG tablet Take 5 mg by mouth daily.    [provider]  aspirin EC 81 MG tablet Take 81 mg by mouth daily.      [provider]  azithromycin (ZITHROMAX) 250 MG tablet Take 1 tablet (250 mg total) by mouth daily. Take first 2 tablets together, then 1 every day until finished. Patient not taking: Reported on 07/31/2017 07/08/16   Rolland Porter, MD  baclofen (LIORESAL) 10 MG tablet Take 10 mg by mouth at bedtime.     [provider]  clopidogrel (PLAVIX) 75 MG tablet Take 75 mg by mouth daily.    [provider]  enalapril (VASOTEC) 20 MG tablet Take 20 mg by mouth at bedtime.     [provider]  EPINEPHrine (EPI-PEN) 0.3 mg/0.3 mL DEVI Inject 0.3 mg into the muscle once as needed (for anaphylactic reaction).     [provider]  furosemide (LASIX) 40 MG tablet Take 40 mg by mouth 2 (two) times  daily.    [provider]  gabapentin (NEURONTIN) 300 MG capsule Take 300-600 mg by mouth See admin instructions. Take 300 mg by mouth in the morning then 300 mg at noon then 600 mg at bedtime    [provider]  HYDROcodone-acetaminophen (NORCO) 10-325 MG tablet Take 1 tablet by mouth 2 (two) times daily.     [provider]  Ipratropium-Albuterol (COMBIVENT) 20-100 MCG/ACT AERS respimat Inhale 1 puff into the lungs 4 (four) times daily.    [provider]  Melatonin 3 MG TABS Take 6 mg by mouth at bedtime.    [provider]  MESALAMINE PO Take 1,500 mg by mouth daily.    [provider]  metoprolol tartrate (LOPRESSOR) 25 MG tablet Take 12.5 mg by mouth 2 (two) times daily.    [provider]  mometasone (ASMANEX) 220 MCG/INH inhaler Inhale 1 puff into the lungs at bedtime.      [provider]  Multiple Vitamin (MULTIVITAMIN) tablet Take 1 tablet by mouth daily.    [provider]  nitroGLYCERIN (NITROSTAT) 0.4 MG SL tablet Place 0.4 mg under the tongue every 5 (five) minutes as needed for chest pain.    [provider]  pantoprazole (PROTONIX) 20 MG tablet Take 1 tablet (20 mg total) by mouth 2 (two) times daily before a meal. 08/09/15   Pollina, Gwenyth Allegra, MD  polyethylene glycol (MIRALAX / GLYCOLAX) packet Take 17 g by mouth daily. Patient taking differently: Take 17 g by mouth daily as needed for mild constipation or moderate constipation.  08/22/15   Lavina Hamman, MD  pravastatin (PRAVACHOL) 40 MG tablet Take 40 mg by mouth at bedtime.    [provider]    Allergies    Bee venom and Influenza vaccines  Review of Systems   Review of Systems  Constitutional:       Per HPI, otherwise negative  HENT:       Per HPI, otherwise negative  Respiratory:       Per HPI, otherwise negative  Cardiovascular:       Per HPI, otherwise negative  Gastrointestinal: Positive for abdominal  distention, abdominal pain, nausea and vomiting.  Endocrine:       Negative aside from HPI  Genitourinary:       Neg aside from HPI   Musculoskeletal:       Per HPI, otherwise negative  Skin: Negative.   Neurological: Negative for syncope.    Physical Exam Updated Vital Signs BP (!) 154/70   Pulse 94   Temp 98.6 F (37 C) (Oral)   Resp 20   Ht 6\' 2"  (1.88 m)   Wt 93.9 kg   SpO2 97%   BMI 26.58 kg/m   Physical Exam Vitals and nursing note reviewed.  Constitutional:      General: He is not in acute distress.    Appearance: He is well-developed.     Comments: Uncomfortable appearing adult male awake and alert participating in transfer from gurney to [facility].  HENT:     Head: Normocephalic and atraumatic.  Eyes:     Conjunctiva/sclera: Conjunctivae normal.  Cardiovascular:     Rate and Rhythm: Regular rhythm. Tachycardia present.  Pulmonary:     Effort: Pulmonary effort is normal. No respiratory distress.     Breath sounds: No stridor.  Abdominal:     General: There is distension.     Tenderness: There is no abdominal tenderness. There is no guarding.  Skin:    General: Skin is warm and dry.  Neurological:     Mental Status: He is alert and oriented to person, place, and time.     ED Results / Procedures / Treatments   Labs (all labs ordered are listed, but only abnormal results are displayed) Labs Reviewed  COMPREHENSIVE METABOLIC PANEL - Abnormal; Notable for the following components:      Result Value   Potassium 3.4 (*)    Glucose, Bld 145 (*)    Creatinine, Ser 2.10 (*)    Calcium 8.5 (*)    Albumin 2.1 (*)    GFR, Estimated 31 (*)    All other components within normal limits  CBC WITH DIFFERENTIAL/PLATELET - Abnormal; Notable for the following components:   WBC 14.8 (*)    RBC 3.61 (*)    Hemoglobin 10.7 (*)    HCT 33.8 (*)    Platelets 421 (*)    Neutro Abs 12.5 (*)  Monocytes Absolute 1.1 (*)    Abs Immature Granulocytes 0.09 (*)    All  other components within normal limits  SARS CORONAVIRUS 2 (TAT 6-24 HRS)  LIPASE, BLOOD  URINALYSIS, ROUTINE W REFLEX MICROSCOPIC    EKG EKG Interpretation  Date/Time:  Thursday June 20 2020 10:41:31 EDT Ventricular Rate:  101 PR Interval:  193 QRS Duration: 100 QT Interval:  328 QTC Calculation: 426 R Axis:   81 Text Interpretation: Sinus tachycardia Consider left atrial enlargement Anterior infarct, old Nonspecific repol abnormality, diffuse leads Baseline wander Abnormal ECG Confirmed by Carmin Muskrat [phone]) on 06/20/2020 10:44:47 AM   Radiology CT ABDOMEN PELVIS WO CONTRAST  Result Date: 06/20/2020 CLINICAL DATA:  Abdominal distension. Nausea. Diverticulitis suspected. EXAM: CT ABDOMEN AND PELVIS WITHOUT CONTRAST TECHNIQUE: Multidetector CT imaging of the abdomen and pelvis was performed following the standard protocol without IV contrast. COMPARISON:  CT 08/19/2015 FINDINGS: Lower chest: Linear atelectasis in the right lower lobe. No pleural fluid. Normal heart size with coronary artery calcifications. Hepatobiliary: Vague low-density in the right lobe of the liver corresponds to hemangioma on prior contrast-enhanced exam. No evidence of new hepatic lesion. Cholecystectomy without biliary dilatation. Pancreas: Inflammatory fat stranding in the left upper quadrant fat abuts the tail the pancreas, however pancreatic inflammation is not favored at this time. Scattered parenchymal calcifications. No ductal dilatation. Spleen: Normal in size without focal abnormality. Adrenals/Urinary Tract: No adrenal nodule. No hydronephrosis. There is symmetric bilateral perinephric edema, also seen on prior exam. Multiple bilateral low-density lesions consistent with cysts. There are also 2 lesions are not definitively cysts. In the left kidney there is a 16 mm lesion with Hounsfield units of 27, this previously measured 12 mm in 2017. In the upper right kidney there is a subcentimeter hyperdense lesion  with Hounsfield units of 36. Isodense to renal parenchymal lesion in the anterior mid left kidney corresponds to a probable hyperdense cyst on prior exam. This was not definitively seen on prior. No visualized renal calculi. Unremarkable urinary bladder. Stomach/Bowel: There is irregular gastric wall thickening about the cardia and greater curvature, for example series 3, image 21. Wall thickening measures at least 3.5 cm. The administered enteric contrast has exited the stomach, with gastric nondistention limiting detailed assessment. There is adjacent fat stranding in the perigastric fat in the left upper quadrant, prominent in degree. No extraluminal air or enteric contrast. Tiny intramural lipoma in the duodenum. This is of no clinical significance. Mildly dilated small bowel in the lower abdomen measuring up to 3.2 cm. Distal small bowel are nondistended, however no discrete transition point is seen. Extensive colonic diverticulosis. No discrete diverticulitis or colonic inflammation. Appendix is not confidently visualized. No evidence of appendicitis. Vascular/Lymphatic: Aortic and branch atherosclerosis. No aortic aneurysm. Scattered small upper abdominal and perigastric nodes. No enlarged lymph nodes by size criteria. Reproductive: Prostate is unremarkable. Other: Prominent fat stranding and inflammation in the left upper quadrant, which appears to be related to adjacent gastric wall thickening. Small amount of fluid or soft tissue thickening in the left pericolic gutter, series 3, images 35 and 43, may be reactive but is nonspecific. Broad-based lower ventral abdominal wall laxity with rectus diastasis. No free air. No focal fluid collection. Musculoskeletal: Bones diffusely under mineralized. Posterior lumbar fusion L2 through S1. Focal kyphosis at L1-L2 with disc space obliteration. This is not significantly changed from prior exam. Right hip arthroplasty. IMPRESSION: 1. Irregular gastric wall thickening  about the cardia and greater curvature, greater than 3.5 cm. Differential considerations  include neoplasm, peptic ulcer disease or less likely focal gastritis. There is significant adjacent inflammation in the left upper quadrant, but no free air or frank perforation. This inflammation abuts the tail the pancreas, however appears to be related to stomach rather than pancreatic. Endoscopy recommended for further evaluation. 2. Small amount of free fluid or soft tissue density in the left pericolic gutter, possibly reactive but indeterminate. 3. Mildly dilated small bowel in the lower abdomen without discrete transition point, suspicious for ileus. 4. Extensive colonic diverticulosis without diverticulitis. 5. Bilateral renal cysts. There also 2 indeterminate lesions, 1 in the right and 1 in the left kidney. Left renal lesion has slightly increased in size from 2017, currently 16 mm. Recommend MRI characterization after resolution of acute event on an elective outpatient basis. Aortic Atherosclerosis (ICD10-I70.0). Electronically Signed   By: Keith Rake M.D.   On: 06/20/2020 15:43    Procedures Procedures   Medications Ordered in ED Medications  0.9 %  sodium chloride infusion ( Intravenous New Bag/Given 06/20/20 1056)  iohexol (OMNIPAQUE) 9 MG/ML oral solution (has no administration in time range)  ondansetron (ZOFRAN) injection 4 mg (4 mg Intravenous Given 06/20/20 1057)  metoCLOPramide (REGLAN) injection 10 mg (10 mg Intravenous Given 06/20/20 1222)  albuterol (VENTOLIN HFA) 108 (90 Base) MCG/ACT inhaler 4 puff (4 puffs Inhalation Given 06/20/20 1339)  sodium chloride 0.9 % bolus 500 mL (500 mLs Intravenous New Bag/Given 06/20/20 1600)    ED Course  I have reviewed the triage vital signs and the nursing notes.  Pertinent labs & imaging results that were available during my care of the patient were reviewed by me and considered in my medical decision making (see chart for details).    4:32  PM Patient continues to have some emesis, though diminished since arrival.  He remains without hypotension, and heart rate has now diminished somewhat after initial fluid resuscitation. Findings notable for evidence for acute kidney injury, and abnormal CT with concerning changes around the gastric lining. I have discussed the patient's case with our gastroenterology colleagues, the bowel gastrology and they will follow as a consulting service.  I also discussed with our internal medicine colleagues for admission.  MDM Rules/Calculators/A&P MDM Number of Diagnoses or Management Options AKI (acute kidney injury) ([place]): new, needed workup Bilious vomiting with nausea: new, needed workup Gastric lesion: new, needed workup   Amount and/or Complexity of Data Reviewed Clinical lab tests: ordered and reviewed Tests in the radiology section of CPT: ordered and reviewed Tests in the medicine section of CPT: reviewed and ordered Discuss the patient with other providers: yes Independent visualization of images, tracings, or specimens: yes  Risk of Complications, Morbidity, and/or Mortality Presenting problems: high Diagnostic procedures: high Management options: high  Critical Care Total time providing critical care: < 30 minutes  Patient Progress Patient progress: stable   Final Clinical Impression(s) / ED Diagnoses Final diagnoses:  AKI (acute kidney injury) ([place])  Bilious vomiting with nausea  Gastric lesion    Rx / DC Orders ED Discharge Orders    None       Carmin Muskrat, MD 06/20/20 [phone]

## 2020-06-20 NOTE — Hospital Course (Addendum)
Hx of EGD for GERD, diagnosed with hiatal hernia.  No dysphagia or odynphagia.  Other cancer hx: melanoma (2019, excised), renal cell carcinoma (records missing)

## 2020-06-20 NOTE — ED Notes (Signed)
Oral omnipaque solution given to pt at this time. Plan is to do CT 2 hours from now. Pt tolerating PO at this time. Will continue to monitor. Pt was instructed to call RN if he becomes nauseous again.

## 2020-06-20 NOTE — Consult Note (Addendum)
[facility] Consult: 4:12 PM 06/20/2020  LOS: 0 days    Referring Provider: Dr. Vanita Panda in the ED. Primary Care Physician:  System, Provider Not In Primary Gastroenterologist:  unassigned    Reason for Consultation: Nausea, vomiting and irregular appearance to the stomach on CT.   HPI: Wayne Meadows is a 86 y.o. male.  PMH COPD.  CAD on cardiac stenting.  CVA.  Chronic Plavix.  Colon perforation following colonoscopy (not in [place]) around 2013 required partial colectomy.  Previous remote EGDs. 08/2015 colonoscopy: For evaluation of GI bleeding.  An adequate bowel prep.  Polyps removed from rectum and transverse colon.  Sigmoid colon granularity.  Diverticulosis in the sigmoid, descending and transverse colon.  Nonbleeding internal hemorrhoids Pathology on polyps were tubular adenoma and hyperplastic.  Sigmoid biopsies chronic active colitis  Compliant with pantoprazole 20 mg bid.  Takes Plavix but his last dose was on 4/25, Monday.  Daily low-dose aspirin.  Drinks an occasional glass of wine but not a heavy drinker. Between January 2022 and now he is lost 20 pounds associated with reduced appetite.  Was not having any active GI symptoms until this Tuesday, 2 days ago, when he developed active nonbloody emesis of almost everything he tried to swallow.  Eventually having just dry heaves.  Stools brown but smaller in quantity.  Abdomen feels tight and bloated but no true pain.  No lower extremity edema.  No palpitations, dizziness.  Generally feels weak.  Vital signs stable. CTAP w/o contrast: Irregular gastric wall thickening at cardia and greater curvature.  Differential includes neoplasm, PUD or less likely focal gastritis.  Significant adjacent left upper quadrant inflammation, no perforation.  Inflammation  abuts tail of pancreas but appears to be gastric in origin rather than pancreatic.  Small amount of free fluid or soft tissue density in the left pericolic gutter, possibly reactive.  Mildly dilated small bowel loops without obstruction, suspicious for ileus.  Extensive colonic diverticulosis.  Bilateral renal cysts.  One of the cysts in the left kidney has increased in size and recommend MRI for further eval.  WBCs 14.8.  Hb 10.7.  MCV 93.  Normal platelet. GFR 31 with normal BUN, creatinine 2.1.  LFTs, lipase normal    Past Medical History:  Diagnosis Date  . Arthritis   . Carotid stenosis   . COPD (chronic obstructive pulmonary disease) ([place])   . Coronary artery disease    S/p PCI 2011;  NSTEMI 12/12:  LHC/PCI 02/23/11: LAD 60% after the septal perforator, D1 occluded with distal collaterals, proximal RI 30-40%, AV circumflex stent patent with 60% stenosis after the stent, RCA 99%, EF 60-65%.  His RCA was treated with a bare-metal stent  . CVA (cerebral infarction) 2011   Right cerebral; total obstruction of the right ICA  . Diverticulitis   . Hypertension   . Rectal bleeding 07/2015  . Renal carcinoma ([place])   . Stroke ([place])   . Tobacco abuse, in remission     Past Surgical History:  Procedure Laterality Date  . COLON SURGERY    .  COLONOSCOPY N/A 08/22/2015   Procedure: COLONOSCOPY;  Surgeon: [name], MD;  Location: [facility];  Service: Endoscopy;  Laterality: N/A;  . hip relacement    . KIDNEY SURGERY    . LEFT HEART CATHETERIZATION WITH CORONARY ANGIOGRAM N/A 02/23/2011   Procedure: LEFT HEART CATHETERIZATION WITH CORONARY ANGIOGRAM;  Surgeon: [name], MD;  Location: [facility] CATH LAB;  Service: Cardiovascular;  Laterality: N/A;  . LEFT HEART CATHETERIZATION WITH CORONARY ANGIOGRAM N/A 03/18/2011   Procedure: LEFT HEART CATHETERIZATION WITH CORONARY ANGIOGRAM;  Surgeon: [name], MD;  Location: [facility] CATH LAB;  Service: Cardiovascular;  Laterality: N/A;  .  PERCUTANEOUS CORONARY STENT INTERVENTION (PCI-S) N/A 02/23/2011   Procedure: PERCUTANEOUS CORONARY STENT INTERVENTION (PCI-S);  Surgeon: [name], MD;  Location: [facility] CATH LAB;  Service: Cardiovascular;  Laterality: N/A;  . TEMPORARY PACEMAKER INSERTION N/A 02/23/2011   Procedure: TEMPORARY PACEMAKER INSERTION;  Surgeon: [name], MD;  Location: [facility] CATH LAB;  Service: Cardiovascular;  Laterality: N/A;    Prior to Admission medications   Medication Sig Start Date End Date Taking? Authorizing Provider  amLODipine (NORVASC) 5 MG tablet Take 5 mg by mouth daily.    [provider]  aspirin EC 81 MG tablet Take 81 mg by mouth daily.      [provider]  azithromycin (ZITHROMAX) 250 MG tablet Take 1 tablet (250 mg total) by mouth daily. Take first 2 tablets together, then 1 every day until finished. Patient not taking: Reported on 07/31/2017 07/08/16   Rolland Porter, MD  baclofen (LIORESAL) 10 MG tablet Take 10 mg by mouth at bedtime.     [provider]  clopidogrel (PLAVIX) 75 MG tablet Take 75 mg by mouth daily.    [provider]  enalapril (VASOTEC) 20 MG tablet Take 20 mg by mouth at bedtime.     [provider]  EPINEPHrine (EPI-PEN) 0.3 mg/0.3 mL DEVI Inject 0.3 mg into the muscle once as needed (for anaphylactic reaction).     [provider]  furosemide (LASIX) 40 MG tablet Take 40 mg by mouth 2 (two) times daily.    [provider]  gabapentin (NEURONTIN) 300 MG capsule Take 300-600 mg by mouth See admin instructions. Take 300 mg by mouth in the morning then 300 mg at noon then 600 mg at bedtime    [provider]  HYDROcodone-acetaminophen (NORCO) 10-325 MG tablet Take 1 tablet by mouth 2 (two) times daily.     [provider]  Ipratropium-Albuterol (COMBIVENT) 20-100 MCG/ACT AERS respimat Inhale 1 puff into the lungs 4 (four) times daily.    [provider]  Melatonin 3 MG TABS Take 6 mg by mouth  at bedtime.    [provider]  MESALAMINE PO Take 1,500 mg by mouth daily.    [provider]  metoprolol tartrate (LOPRESSOR) 25 MG tablet Take 12.5 mg by mouth 2 (two) times daily.    [provider]  mometasone (ASMANEX) 220 MCG/INH inhaler Inhale 1 puff into the lungs at bedtime.      [provider]  Multiple Vitamin (MULTIVITAMIN) tablet Take 1 tablet by mouth daily.    [provider]  nitroGLYCERIN (NITROSTAT) 0.4 MG SL tablet Place 0.4 mg under the tongue every 5 (five) minutes as needed for chest pain.    [provider]  pantoprazole (PROTONIX) 20 MG tablet Take 1 tablet (20 mg total) by mouth 2 (two) times daily before a meal. 08/09/15  Orpah Greek, MD  polyethylene glycol (MIRALAX / GLYCOLAX) packet Take 17 g by mouth daily. Patient taking differently: Take 17 g by mouth daily as needed for mild constipation or moderate constipation.  08/22/15   Lavina Hamman, MD  pravastatin (PRAVACHOL) 40 MG tablet Take 40 mg by mouth at bedtime.    [provider]    Scheduled Meds: . iohexol       Infusions: . sodium chloride 125 mL/hr at 06/20/20 1056   PRN Meds:    Allergies as of 06/20/2020 - Review Complete 06/20/2020  Allergen Reaction Noted  . Bee venom Anaphylaxis 02/22/2011  . Influenza vaccines Other (See Comments) 07/13/2013    Family History  Problem Relation Age of Onset  . Heart attack Other 30    Social History   Socioeconomic History  . Marital status: Divorced    Spouse name: Not on file  . Number of children: Not on file  . Years of education: Not on file  . Highest education level: Not on file  Occupational History  . Not on file  Tobacco Use  . Smoking status: Former Smoker    Types: Cigarettes    Quit date: 02/24/2007    Years since quitting: 13.3  . Smokeless tobacco: Never Used  Substance and Sexual Activity  . Alcohol use: Not Currently    Alcohol/week: 1.0 standard  drink    Types: 1 Standard drinks or equivalent per week    Comment: socially   . Drug use: No  . Sexual activity: Not on file  Other Topics Concern  . Not on file  Social History Narrative  . Not on file   Social Determinants of Health   Financial Resource Strain: Not on file  Food Insecurity: Not on file  Transportation Needs: Not on file  Physical Activity: Not on file  Stress: Not on file  Social Connections: Not on file  Intimate Partner Violence: Not on file    REVIEW OF SYSTEMS: Constitutional: Weakness ENT:  No nose bleeds Pulm: No shortness of breath.  Occasional cough. CV:  No palpitations, no LE edema.  GU:  No hematuria, no frequency GI: See HPI. Heme: No unusual bleeding or bruising. Transfusions: No records of previous blood transfusions in epic. Neuro:  No headaches, no peripheral tingling or numbness.  No syncope, no seizures. Derm:  No itching, no rash or sores.  Endocrine:  No sweats or chills.  No polyuria or dysuria Immunization: Has received 2 COVID vaccines. Travel:  None beyond local counties in last few months.    PHYSICAL EXAM: Vital signs in last 24 hours: Vitals:   06/20/20 1445 06/20/20 1600  BP: (!) 173/67 (!) 154/70  Pulse: (!) 104 94  Resp: (!) 23 20  Temp:    SpO2: 97% 97%   Wt Readings from Last 3 Encounters:  06/20/20 93.9 kg  07/31/17 100.2 kg  09/18/16 103.4 kg    General: Patient looks unwell, tired.  Does not look unstable Head: No facial asymmetry or swelling.  No signs of head trauma. Eyes: Nose lateral icterus or conjunctival pallor. Ears: Not hard of hearing Nose: No congestion or discharge Mouth: Oropharynx moist, pink, clear.  Bulk of his teeth are absent.  Upper dentures in place.  Tongue midline. Neck: No JVD, no masses, no thyromegaly Lungs: Slight cough.  No dyspnea.  Lungs clear bilaterally Heart: RRR.  No MRG.  S1, S2 present Abdomen: Distended with small incisional hernia in the lower abdomen.  No HSM,  masses, bruits.  Not tender.  Active bowel sounds..   Rectal: Deferred Musc/Skeltl: No joint redness, swelling or significant deformities. Extremities: No lower extremity edema. Neurologic: Alert and oriented x3.  Moves all 4 limbs without tremor, strength not tested. Skin: No rash, no sores, no telangiectasia Nodes: No cervical adenopathy Psych: Cooperative, calm, fluid speech.  Intake/Output from previous day: No intake/output data recorded. Intake/Output this shift: No intake/output data recorded.  LAB RESULTS: Recent Labs    06/20/20 1039  WBC 14.8*  HGB 10.7*  HCT 33.8*  PLT 421*   BMET Lab Results  Component Value Date   NA 137 06/20/2020   NA 142 07/31/2017   NA 141 09/18/2016   K 3.4 (L) 06/20/2020   K 3.9 07/31/2017   K 3.9 09/18/2016   CL 104 06/20/2020   CL 107 07/31/2017   CL 104 09/18/2016   CO2 24 06/20/2020   CO2 28 07/31/2017   CO2 26 09/18/2016   GLUCOSE 145 (H) 06/20/2020   GLUCOSE 124 (H) 07/31/2017   GLUCOSE 148 (H) 09/18/2016   BUN 10 06/20/2020   BUN 10 07/31/2017   BUN 13 09/18/2016   CREATININE 2.10 (H) 06/20/2020   CREATININE 1.42 (H) 07/31/2017   CREATININE 1.49 (H) 09/18/2016   CALCIUM 8.5 (L) 06/20/2020   CALCIUM 8.4 (L) 07/31/2017   CALCIUM 9.3 09/18/2016   LFT Recent Labs    06/20/20 1039  PROT 6.8  ALBUMIN 2.1*  AST 23  ALT 14  ALKPHOS 82  BILITOT 0.8   PT/INR Lab Results  Component Value Date   INR 1.13 07/31/2017   INR 1.30 04/11/2016   INR 1.20 08/19/2015   Hepatitis Panel No results for input(s): HEPBSAG, HCVAB, HEPAIGM, HEPBIGM in the last 72 hours. C-Diff No components found for: CDIFF Lipase     Component Value Date/Time   LIPASE 51 06/20/2020 1039    Drugs of Abuse     Component Value Date/Time   LABOPIA POSITIVE (A) 07/31/2017 2148   COCAINSCRNUR NONE DETECTED 07/31/2017 2148   LABBENZ NONE DETECTED 07/31/2017 2148   AMPHETMU NONE DETECTED 07/31/2017 2148   THCU NONE DETECTED 07/31/2017 2148    LABBARB NONE DETECTED 07/31/2017 2148     RADIOLOGY STUDIES: CT ABDOMEN PELVIS WO CONTRAST  Result Date: 06/20/2020 CLINICAL DATA:  Abdominal distension. Nausea. Diverticulitis suspected. EXAM: CT ABDOMEN AND PELVIS WITHOUT CONTRAST TECHNIQUE: Multidetector CT imaging of the abdomen and pelvis was performed following the standard protocol without IV contrast. COMPARISON:  CT 08/19/2015 FINDINGS: Lower chest: Linear atelectasis in the right lower lobe. No pleural fluid. Normal heart size with coronary artery calcifications. Hepatobiliary: Vague low-density in the right lobe of the liver corresponds to hemangioma on prior contrast-enhanced exam. No evidence of new hepatic lesion. Cholecystectomy without biliary dilatation. Pancreas: Inflammatory fat stranding in the left upper quadrant fat abuts the tail the pancreas, however pancreatic inflammation is not favored at this time. Scattered parenchymal calcifications. No ductal dilatation. Spleen: Normal in size without focal abnormality. Adrenals/Urinary Tract: No adrenal nodule. No hydronephrosis. There is symmetric bilateral perinephric edema, also seen on prior exam. Multiple bilateral low-density lesions consistent with cysts. There are also 2 lesions are not definitively cysts. In the left kidney there is a 16 mm lesion with Hounsfield units of 27, this previously measured 12 mm in 2017. In the upper right kidney there is a subcentimeter hyperdense lesion with Hounsfield units of 36. Isodense to renal parenchymal lesion in the anterior mid left kidney  corresponds to a probable hyperdense cyst on prior exam. This was not definitively seen on prior. No visualized renal calculi. Unremarkable urinary bladder. Stomach/Bowel: There is irregular gastric wall thickening about the cardia and greater curvature, for example series 3, image 21. Wall thickening measures at least 3.5 cm. The administered enteric contrast has exited the stomach, with gastric  nondistention limiting detailed assessment. There is adjacent fat stranding in the perigastric fat in the left upper quadrant, prominent in degree. No extraluminal air or enteric contrast. Tiny intramural lipoma in the duodenum. This is of no clinical significance. Mildly dilated small bowel in the lower abdomen measuring up to 3.2 cm. Distal small bowel are nondistended, however no discrete transition point is seen. Extensive colonic diverticulosis. No discrete diverticulitis or colonic inflammation. Appendix is not confidently visualized. No evidence of appendicitis. Vascular/Lymphatic: Aortic and branch atherosclerosis. No aortic aneurysm. Scattered small upper abdominal and perigastric nodes. No enlarged lymph nodes by size criteria. Reproductive: Prostate is unremarkable. Other: Prominent fat stranding and inflammation in the left upper quadrant, which appears to be related to adjacent gastric wall thickening. Small amount of fluid or soft tissue thickening in the left pericolic gutter, series 3, images 35 and 43, may be reactive but is nonspecific. Broad-based lower ventral abdominal wall laxity with rectus diastasis. No free air. No focal fluid collection. Musculoskeletal: Bones diffusely under mineralized. Posterior lumbar fusion L2 through S1. Focal kyphosis at L1-L2 with disc space obliteration. This is not significantly changed from prior exam. Right hip arthroplasty. IMPRESSION: 1. Irregular gastric wall thickening about the cardia and greater curvature, greater than 3.5 cm. Differential considerations include neoplasm, peptic ulcer disease or less likely focal gastritis. There is significant adjacent inflammation in the left upper quadrant, but no free air or frank perforation. This inflammation abuts the tail the pancreas, however appears to be related to stomach rather than pancreatic. Endoscopy recommended for further evaluation. 2. Small amount of free fluid or soft tissue density in the left  pericolic gutter, possibly reactive but indeterminate. 3. Mildly dilated small bowel in the lower abdomen without discrete transition point, suspicious for ileus. 4. Extensive colonic diverticulosis without diverticulitis. 5. Bilateral renal cysts. There also 2 indeterminate lesions, 1 in the right and 1 in the left kidney. Left renal lesion has slightly increased in size from 2017, currently 16 mm. Recommend MRI characterization after resolution of acute event on an elective outpatient basis. Aortic Atherosclerosis (ICD10-I70.0). Electronically Signed   By: Keith Rake M.D.   On: 06/20/2020 15:43      IMPRESSION:   *   Acute, 3-day history of nausea, nonbloody emesis/dry heaves, abdominal bloating. CT scan showing irregular appearance in the gastric cardia and greater curvature.  History of 20 pound weight loss over 4 months.  Compliant with pantoprazole 20 mg bid.  Overall picture concerning for neoplasm.  *   Normocytic anemia.  Hb 10.7, only prior for comparison is 13.6 in June 2019.  *    Leukocytosis.  *     GI bleed, presumed diverticular in 08/2015.  History of adenomatous and hyperplastic colon polyps along with diverticulosis and sigmoid biopsies confirming chronic active colitis during colonoscopy in 08/2015.  Sustained bowel perforation requiring partial colectomy during colonoscopy prior to that around 2013.  *    chronic Plavix.  History of coronary stenting and CVA.  Due to the nausea and vomiting he has not taken his Plavix for 3 days, the last dose was on Monday 4/25.  PLAN:     *    EGD tomorrow.  *    OK for clear liquids but n.p.o. after midnight  *     Protonix 40 mg IV bid for now.   Azucena Freed  06/20/2020, 4:12 PM Phone [phone]  GI ATTENDING  History, laboratories, x-rays reviewed.  Patient seen and examined.  Agree with comprehensive consultation note as outlined above.  Patient presents with nausea, vomiting, abdominal pain.  No significant  abnormality is appearance of the stomach on CT.  Changes are concerning for neoplasia versus ulcer disease.  Plans for upper endoscopy tomorrow morning.  The patient is high risk given his comorbidities and the need for Plavix.  The nature of the procedure, as well as the risks, benefits, and alternatives were carefully and thoroughly reviewed with the patient. Ample time for discussion and questions allowed. The patient understood, was satisfied, and agreed to proceed.  Docia Chuck. Geri Seminole., M.D. [facility] Division of Gastroenterology

## 2020-06-20 NOTE — H&P (Addendum)
Date: 06/20/2020               Patient Name:  Wayne Meadows MRN: [id number]  DOB: 1934-03-01 Age / Sex: 86 y.o., male   PCP: System, Provider Not In              Medical Service: Internal Medicine Teaching Service              Attending Physician: Dr. Velna Ochs    First Contact: Ethelene Hal, MS3 Pager: [phone]  Second Contact: Dr. Virl Axe Pager: [phone]  Third Contact Dr. Jose Persia Pager: [phone]       After Hours (After 5p/  First Contact Pager: [phone]  weekends / holidays): Second Contact Pager: [phone]   Chief Complaint: Nausea and vomiting with dehydration  History of Present Illness:  Wayne Meadows is a 86 y.o. male with a PMH of COPD, HTN, CAD, CVA, diverticulitis, GERD, renal cell carcinoma, and stage 3b CKD who presented to the ED with nausea and vomiting with dehydration. Wayne Meadows states these symptoms started 3 days ago after Wayne Meadows ate "a couple bites" of potato salad from a restaurant Wayne Meadows frequents. Wayne Meadows could not eat much due to not having an appetite. Wayne Meadows contacted his PCP at the [place] who recommended Wayne Meadows come to the ED. Over the course of two days, Wayne Meadows vomited 2-3 times. Wayne Meadows characterizes the vomit as clear with no food or blood. Wayne Meadows has not been able to keep any food or medications down since these symptoms began, only Gatorade. For the last day, Wayne Meadows has only dry-heaved. Wayne Meadows endorses 5/10 pain in the epigastrium when Wayne Meadows dry heaves. Wayne Meadows also has RUQ pain that radiates to his back occasionally and without specific trigger, but this is chronic and was initially attributed to gallbladder disease although it did not resolve despite cholecystectomy.  Wayne Meadows denies ever having symptoms like this before. Wayne Meadows has not been around any sick contacts that Wayne Meadows is aware of. Wayne Meadows has not traveled outside of adjacent counties. Wayne Meadows last had a bowel movement yesterday that was normal for him without pain or blood; Wayne Meadows has not had any recent diarrhea or constipation. Wayne Meadows has not had any pain  or difficulty urinating. Wayne Meadows denies any fever, but Wayne Meadows does endorse some chills on Monday night where Wayne Meadows felt cold despite being covered up with multiple blankets. Wayne Meadows also has noticed an unintentional weight loss of around 20 pounds since January, and Wayne Meadows remembers his appetite waning around December of 2021. Wayne Meadows endorses a history of gastric reflux that Wayne Meadows takes daily medication for, but Wayne Meadows cannot recall the name. This medication controls his reflux symptoms well. Wayne Meadows denies any chest pain, SOB, dizziness, or palpitations.   Meds: Current Meds  Medication Sig  . albuterol (VENTOLIN HFA) 108 (90 Base) MCG/ACT inhaler Inhale 1 puff into the lungs every 6 (six) hours as needed for shortness of breath.  Marland Kitchen amLODipine (NORVASC) 5 MG tablet Take 10 mg by mouth daily.  . baclofen (LIORESAL) 10 MG tablet Take 10 mg by mouth at bedtime.  . clopidogrel (PLAVIX) 75 MG tablet Take 75 mg by mouth daily.  . enalapril (VASOTEC) 20 MG tablet Take 10 mg in the morning and 20 mg by mouth at bedtime.  . gabapentin (NEURONTIN) 300 MG capsule Take 300-600 mg by mouth See admin instructions. Take 300 mg by mouth in the morning then 300 mg at noon then 600 mg at bedtime  . HYDROcodone-acetaminophen (NORCO) 10-325 MG tablet Take 1  tablet by mouth 2 (two) times daily.  Marland Kitchen MESALAMINE PO Take 1,500 mg by mouth daily.  . metoprolol tartrate (LOPRESSOR) 25 MG tablet Take 25 mg by mouth daily.  . mometasone (ASMANEX) 220 MCG/INH inhaler Inhale 1 puff into the lungs at bedtime.  . Multiple Vitamin (MULTIVITAMIN) tablet Take 1 tablet by mouth daily.  . nitroGLYCERIN (NITROSTAT) 0.4 MG SL tablet Place 0.4 mg under the tongue every 5 (five) minutes as needed for chest pain.  . pantoprazole (PROTONIX) 20 MG tablet Take 1 tablet (20 mg total) by mouth 2 (two) times daily before a meal.  . polyethylene glycol (MIRALAX / GLYCOLAX) packet Take 17 g by mouth daily. (Patient taking differently: Take 17 g by mouth daily as needed for mild  constipation or moderate constipation.)  . pravastatin (PRAVACHOL) 40 MG tablet Take 40 mg by mouth at bedtime.    Allergies: Allergies as of 06/20/2020 - Review Complete 06/20/2020  Allergen Reaction Noted  . Bee venom Anaphylaxis 02/22/2011  . Influenza vaccines Other (See Comments) 07/13/2013   Past Medical History:  Diagnosis Date  . Arthritis   . Carotid stenosis   . COPD (chronic obstructive pulmonary disease) ([place])   . Coronary artery disease    S/p PCI 2011;  NSTEMI 12/12:  LHC/PCI 02/23/11: LAD 60% after the septal perforator, D1 occluded with distal collaterals, proximal RI 30-40%, AV circumflex stent patent with 60% stenosis after the stent, RCA 99%, EF 60-65%.  His RCA was treated with a bare-metal stent  . CVA (cerebral infarction) 2011   Right cerebral; total obstruction of the right ICA  . Diverticulitis   . Hypertension   . Rectal bleeding 07/2015  . Renal carcinoma ([place])   . Stroke ([place])   . 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 Wayne Meadows lives in [place] with his dog. Wayne Meadows is divorced and has one son and one daughter who are healthy. Wayne Meadows reports occasional alcohol use with dinner. Wayne Meadows smoked one pack of cigarettes for about 50 years and quit in 2009. Wayne Meadows denies any other substance use.   Review of Systems: A complete ROS was negative except as per HPI.  Physical Exam: Blood pressure (!) 157/71, pulse (!) 110, temperature 100.1 F (37.8 C), resp. rate 20, height 6\' 2"  (1.88 m), weight 89.5 kg, SpO2 96 %.  Physical Exam Constitutional:      Comments: Uncomfortable in bed.  HENT:     Head: Normocephalic and atraumatic.  Eyes:     Extraocular Movements: Extraocular movements intact.   Cardiovascular:     Rate and Rhythm: Regular rhythm. Tachycardia present.     Pulses: Normal pulses.     Heart sounds: Normal heart sounds.  Pulmonary:     Effort: Pulmonary effort is normal.     Breath sounds: Wheezing present.  Abdominal:     General: Bowel sounds are normal. There is distension.     Palpations: Abdomen is soft.  Musculoskeletal:        General: Normal range of motion.     Cervical back: Normal range of motion.  Neurological:     General: No focal deficit present.     Mental Status: Wayne Meadows is alert and oriented to person, place, and  time.  Psychiatric:        Mood and Affect: Mood normal.        Behavior: Behavior normal.        Thought Content: Thought content normal.    EKG: Sinus tachycardia with possible left atrial enlargement, old anterior infarct, nonspecific repol abnormality in diffuse leads  CT A/P w/o contrast:  IMPRESSION: 1. Irregular gastric wall thickening about the cardia and greater curvature, greater than 3.5 cm. Differential considerations include neoplasm, peptic ulcer disease or less likely focal gastritis. There is significant adjacent inflammation in the left upper quadrant, but no free air or frank perforation. This inflammation abuts the tail the pancreas, however appears to be related to stomach rather than pancreatic. Endoscopy recommended for further evaluation. 2. Small amount of free fluid or soft tissue density in the left pericolic gutter, possibly reactive but indeterminate. 3. Mildly dilated small bowel in the lower abdomen without discrete transition point, suspicious for ileus. 4. Extensive colonic diverticulosis without diverticulitis. 5. Bilateral renal cysts. There also 2 indeterminate lesions, 1 in the right and 1 in the left kidney. Left renal lesion has slightly increased in size from 2017, currently 16 mm. Recommend MRI characterization after resolution of acute event on an elective outpatient basis. 6. Aortic  Atherosclerosis (ICD10-I70.0).   CBC w/ diff: WBC 14.8, RBC 3.61, Hgb 10.7, HCT 33.8, Plts 421 CMP: K 3.4, glucose 145, Cr 2.10, Ca 8.5, albumin 2.1, GFR 31 Lipase: 51  Assessment & Plan by Problem: Active Problems:   Refractory nausea and vomiting  1. Refractory nausea and vomiting Patient is uncomfortable in bed. Wayne Meadows is HDS and afebrile. Has had one episode of dry-heaving without vomiting since arrival to the ED. Tachycardia present. Leukocytosis present. Unsure of baseline hemoglobin, but patient with mild anemia. CT demonstrates gastric wall thickening greater than 3.5 cm with significant adjacent inflammation in the LUQ. Patient with 20lb weight loss over past 4 months. Overall picture concerning for neoplasm vs. PUD. -Gastroenterology consulted, will get EGD tomorrow morning for further evaluation -NPO at midnight -Occult blood card to assess bleeding due to low H&H -Ondansetron 4 mg IV every 6 hours prn for nausea -trend CBC  2. Hypertension Patient hypertensive up to 170s/70s. At home, takes norvasc 10mg  daily, clonidine 0.1mg  daily, enalapril 20mg  in the morning and 10mg  at night. Wayne Meadows was on lasix in the past but this was discontinued. Hypertension likely due being unable to tolerate PO intake vs rebound hypertension from missing clonidine. -Continue home clonidine  3. COPD Patient has mild wheezing on exam without SOB. Has not held medication down due to nausea and vomiting. -Continue home albuterol -Continue home Dulera  4. CKD Stage 3B Stable. Baseline creatinine appears to be 2.20. On admission, creatinine is 2.10 so unlikely for this to be a superimposed acute kidney injury. -trend CMP  5. GERD Patient has chronic reflux.  -Protonix 40 mg IV BID for reflux symptoms  6. History of coronary stenting and CVA On chronic plavix at home, but has been unable to take for past 3 days due to N/V. -holding plavix in anticipation for EGD tomorrow  7. Bilateral indeterminate  renal lesions Patient's MRI demonstrates bilateral lesions in the kidney; the left mass has grown in size since 2017. Patient is stable with no c/o flank pain or dysuria. -Recommend further evaluation in the outpatient setting   Dispo: Admit patient to Observation with expected length of stay less than 2 midnights.  Signed: Mikal Plane, Medical Student 06/20/2020,  7:28 PM  Pager: [phone] After 5pm on weekdays and 1pm on weekends: On Call pager [phone]   Attestation for Student Documentation:  I personally was present and performed or re-performed the history, physical exam and medical decision-making activities of this service and have verified that the service and findings are accurately documented in the student's note.  Virl Axe, MD 06/20/2020, 7:57 PM

## 2020-06-20 NOTE — H&P (View-Only) (Signed)
[facility] Consult: 4:12 PM 06/20/2020  LOS: 0 days    Referring Provider: Dr. Vanita Panda in the ED. Primary Care Physician:  System, Provider Not In Primary Gastroenterologist:  unassigned    Reason for Consultation: Nausea, vomiting and irregular appearance to the stomach on CT.   HPI: Wayne Meadows is a 86 y.o. male.  PMH COPD.  CAD on cardiac stenting.  CVA.  Chronic Plavix.  Colon perforation following colonoscopy (not in [place]) around 2013 required partial colectomy.  Previous remote EGDs. 08/2015 colonoscopy: For evaluation of GI bleeding.  An adequate bowel prep.  Polyps removed from rectum and transverse colon.  Sigmoid colon granularity.  Diverticulosis in the sigmoid, descending and transverse colon.  Nonbleeding internal hemorrhoids Pathology on polyps were tubular adenoma and hyperplastic.  Sigmoid biopsies chronic active colitis  Compliant with pantoprazole 20 mg bid.  Takes Plavix but his last dose was on 4/25, Monday.  Daily low-dose aspirin.  Drinks an occasional glass of wine but not a heavy drinker. Between January 2022 and now he is lost 20 pounds associated with reduced appetite.  Was not having any active GI symptoms until this Tuesday, 2 days ago, when he developed active nonbloody emesis of almost everything he tried to swallow.  Eventually having just dry heaves.  Stools brown but smaller in quantity.  Abdomen feels tight and bloated but no true pain.  No lower extremity edema.  No palpitations, dizziness.  Generally feels weak.  Vital signs stable. CTAP w/o contrast: Irregular gastric wall thickening at cardia and greater curvature.  Differential includes neoplasm, PUD or less likely focal gastritis.  Significant adjacent left upper quadrant inflammation, no perforation.  Inflammation  abuts tail of pancreas but appears to be gastric in origin rather than pancreatic.  Small amount of free fluid or soft tissue density in the left pericolic gutter, possibly reactive.  Mildly dilated small bowel loops without obstruction, suspicious for ileus.  Extensive colonic diverticulosis.  Bilateral renal cysts.  One of the cysts in the left kidney has increased in size and recommend MRI for further eval.  WBCs 14.8.  Hb 10.7.  MCV 93.  Normal platelet. GFR 31 with normal BUN, creatinine 2.1.  LFTs, lipase normal    Past Medical History:  Diagnosis Date  . Arthritis   . Carotid stenosis   . COPD (chronic obstructive pulmonary disease) ([place])   . Coronary artery disease    S/p PCI 2011;  NSTEMI 12/12:  LHC/PCI 02/23/11: LAD 60% after the septal perforator, D1 occluded with distal collaterals, proximal RI 30-40%, AV circumflex stent patent with 60% stenosis after the stent, RCA 99%, EF 60-65%.  His RCA was treated with a bare-metal stent  . CVA (cerebral infarction) 2011   Right cerebral; total obstruction of the right ICA  . Diverticulitis   . Hypertension   . Rectal bleeding 07/2015  . Renal carcinoma ([place])   . Stroke ([place])   . Tobacco abuse, in remission     Past Surgical History:  Procedure Laterality Date  . COLON SURGERY    .  COLONOSCOPY N/A 08/22/2015   Procedure: COLONOSCOPY;  Surgeon: [name], MD;  Location: [facility];  Service: Endoscopy;  Laterality: N/A;  . hip relacement    . KIDNEY SURGERY    . LEFT HEART CATHETERIZATION WITH CORONARY ANGIOGRAM N/A 02/23/2011   Procedure: LEFT HEART CATHETERIZATION WITH CORONARY ANGIOGRAM;  Surgeon: [name], MD;  Location: [facility] CATH LAB;  Service: Cardiovascular;  Laterality: N/A;  . LEFT HEART CATHETERIZATION WITH CORONARY ANGIOGRAM N/A 03/18/2011   Procedure: LEFT HEART CATHETERIZATION WITH CORONARY ANGIOGRAM;  Surgeon: [name], MD;  Location: [facility] CATH LAB;  Service: Cardiovascular;  Laterality: N/A;  .  PERCUTANEOUS CORONARY STENT INTERVENTION (PCI-S) N/A 02/23/2011   Procedure: PERCUTANEOUS CORONARY STENT INTERVENTION (PCI-S);  Surgeon: [name], MD;  Location[facility] C[facility] LAB;  Service: Cardiovascular;  Laterality: N/A;  . TEMPORARY PACEMAKER INSERTION N/A 02/23/2011   Procedure: TEMPORARY PACEMAKER INSERTION;  Surgeon: [name], MD;  Location: [facility] CATH LAB;  Service: Cardiovascular;  Laterality: N/A;    Prior to Admission medications   Medication Sig Start Date End Date Taking? Authorizing Provider  amLODipine (NORVASC) 5 MG tablet Take 5 mg by mouth daily.    [provider]  aspirin EC 81 MG tablet Take 81 mg by mouth daily.      [provider]  azithromycin (ZITHROMAX) 250 MG tablet Take 1 tablet (250 mg total) by mouth daily. Take first 2 tablets together, then 1 every day until finished. Patient not taking: Reported on 07/31/2017 07/08/16   Rolland Porter, MD  baclofen (LIORESAL) 10 MG tablet Take 10 mg by mouth at bedtime.     [provider]  clopidogrel (PLAVIX) 75 MG tablet Take 75 mg by mouth daily.    [provider]  enalapril (VASOTEC) 20 MG tablet Take 20 mg by mouth at bedtime.     [provider]  EPINEPHrine (EPI-PEN) 0.3 mg/0.3 mL DEVI Inject 0.3 mg into the muscle once as needed (for anaphylactic reaction).     [provider]  furosemide (LASIX) 40 MG tablet Take 40 mg by mouth 2 (two) times daily.    [provider]  gabapentin (NEURONTIN) 300 MG capsule Take 300-600 mg by mouth See admin instructions. Take 300 mg by mouth in the morning then 300 mg at noon then 600 mg at bedtime    [provider]  HYDROcodone-acetaminophen (NORCO) 10-325 MG tablet Take 1 tablet by mouth 2 (two) times daily.     [provider]  Ipratropium-Albuterol (COMBIVENT) 20-100 MCG/ACT AERS respimat Inhale 1 puff into the lungs 4 (four) times daily.    [provider]  Melatonin 3 MG TABS Take 6 mg by mouth  at bedtime.    [provider]  MESALAMINE PO Take 1,500 mg by mouth daily.    [provider]  metoprolol tartrate (LOPRESSOR) 25 MG tablet Take 12.5 mg by mouth 2 (two) times daily.    [provider]  mometasone (ASMANEX) 220 MCG/INH inhaler Inhale 1 puff into the lungs at bedtime.      [provider]  Multiple Vitamin (MULTIVITAMIN) tablet Take 1 tablet by mouth daily.    [provider]  nitroGLYCERIN (NITROSTAT) 0.4 MG SL tablet Place 0.4 mg under the tongue every 5 (five) minutes as needed for chest pain.    [provider]  pantoprazole (PROTONIX) 20 MG tablet Take 1 tablet (20 mg total) by mouth 2 (two) times daily before a meal. 08/09/15  Orpah Greek, MD  polyethylene glycol (MIRALAX / GLYCOLAX) packet Take 17 g by mouth daily. Patient taking differently: Take 17 g by mouth daily as needed for mild constipation or moderate constipation.  08/22/15   Lavina Hamman, MD  pravastatin (PRAVACHOL) 40 MG tablet Take 40 mg by mouth at bedtime.    [provider]    Scheduled Meds: . iohexol       Infusions: . sodium chloride 125 mL/hr at 06/20/20 1056   PRN Meds:    Allergies as of 06/20/2020 - Review Complete 06/20/2020  Allergen Reaction Noted  . Bee venom Anaphylaxis 02/22/2011  . Influenza vaccines Other (See Comments) 07/13/2013    Family History  Problem Relation Age of Onset  . Heart attack Other 72    Social History   Socioeconomic History  . Marital status: Divorced    Spouse name: Not on file  . Number of children: Not on file  . Years of education: Not on file  . Highest education level: Not on file  Occupational History  . Not on file  Tobacco Use  . Smoking status: Former Smoker    Types: Cigarettes    Quit date: 02/24/2007    Years since quitting: 13.3  . Smokeless tobacco: Never Used  Substance and Sexual Activity  . Alcohol use: Not Currently    Alcohol/week: 1.0 standard  drink    Types: 1 Standard drinks or equivalent per week    Comment: socially   . Drug use: No  . Sexual activity: Not on file  Other Topics Concern  . Not on file  Social History Narrative  . Not on file   Social Determinants of Health   Financial Resource Strain: Not on file  Food Insecurity: Not on file  Transportation Needs: Not on file  Physical Activity: Not on file  Stress: Not on file  Social Connections: Not on file  Intimate Partner Violence: Not on file    REVIEW OF SYSTEMS: Constitutional: Weakness ENT:  No nose bleeds Pulm: No shortness of breath.  Occasional cough. CV:  No palpitations, no LE edema.  GU:  No hematuria, no frequency GI: See HPI. Heme: No unusual bleeding or bruising. Transfusions: No records of previous blood transfusions in epic. Neuro:  No headaches, no peripheral tingling or numbness.  No syncope, no seizures. Derm:  No itching, no rash or sores.  Endocrine:  No sweats or chills.  No polyuria or dysuria Immunization: Has received 2 COVID vaccines. Travel:  None beyond local counties in last few months.    PHYSICAL EXAM: Vital signs in last 24 hours: Vitals:   06/20/20 1445 06/20/20 1600  BP: (!) 173/67 (!) 154/70  Pulse: (!) 104 94  Resp: (!) 23 20  Temp:    SpO2: 97% 97%   Wt Readings from Last 3 Encounters:  06/20/20 93.9 kg  07/31/17 100.2 kg  09/18/16 103.4 kg    General: Patient looks unwell, tired.  Does not look unstable Head: No facial asymmetry or swelling.  No signs of head trauma. Eyes: Nose lateral icterus or conjunctival pallor. Ears: Not hard of hearing Nose: No congestion or discharge Mouth: Oropharynx moist, pink, clear.  Bulk of his teeth are absent.  Upper dentures in place.  Tongue midline. Neck: No JVD, no masses, no thyromegaly Lungs: Slight cough.  No dyspnea.  Lungs clear bilaterally Heart: RRR.  No MRG.  S1, S2 present Abdomen: Distended with small incisional hernia in the lower abdomen.  No HSM,  masses, bruits.  Not tender.  Active bowel sounds..   Rectal: Deferred Musc/Skeltl: No joint redness, swelling or significant deformities. Extremities: No lower extremity edema. Neurologic: Alert and oriented x3.  Moves all 4 limbs without tremor, strength not tested. Skin: No rash, no sores, no telangiectasia Nodes: No cervical adenopathy Psych: Cooperative, calm, fluid speech.  Intake/Output from previous day: No intake/output data recorded. Intake/Output this shift: No intake/output data recorded.  LAB RESULTS: Recent Labs    06/20/20 1039  WBC 14.8*  HGB 10.7*  HCT 33.8*  PLT 421*   BMET Lab Results  Component Value Date   NA 137 06/20/2020   NA 142 07/31/2017   NA 141 09/18/2016   K 3.4 (L) 06/20/2020   K 3.9 07/31/2017   K 3.9 09/18/2016   CL 104 06/20/2020   CL 107 07/31/2017   CL 104 09/18/2016   CO2 24 06/20/2020   CO2 28 07/31/2017   CO2 26 09/18/2016   GLUCOSE 145 (H) 06/20/2020   GLUCOSE 124 (H) 07/31/2017   GLUCOSE 148 (H) 09/18/2016   BUN 10 06/20/2020   BUN 10 07/31/2017   BUN 13 09/18/2016   CREATININE 2.10 (H) 06/20/2020   CREATININE 1.42 (H) 07/31/2017   CREATININE 1.49 (H) 09/18/2016   CALCIUM 8.5 (L) 06/20/2020   CALCIUM 8.4 (L) 07/31/2017   CALCIUM 9.3 09/18/2016   LFT Recent Labs    06/20/20 1039  PROT 6.8  ALBUMIN 2.1*  AST 23  ALT 14  ALKPHOS 82  BILITOT 0.8   PT/INR Lab Results  Component Value Date   INR 1.13 07/31/2017   INR 1.30 04/11/2016   INR 1.20 08/19/2015   Hepatitis Panel No results for input(s): HEPBSAG, HCVAB, HEPAIGM, HEPBIGM in the last 72 hours. C-Diff No components found for: CDIFF Lipase     Component Value Date/Time   LIPASE 51 06/20/2020 1039    Drugs of Abuse     Component Value Date/Time   LABOPIA POSITIVE (A) 07/31/2017 2148   COCAINSCRNUR NONE DETECTED 07/31/2017 2148   LABBENZ NONE DETECTED 07/31/2017 2148   AMPHETMU NONE DETECTED 07/31/2017 2148   THCU NONE DETECTED 07/31/2017 2148    LABBARB NONE DETECTED 07/31/2017 2148     RADIOLOGY STUDIES: CT ABDOMEN PELVIS WO CONTRAST  Result Date: 06/20/2020 CLINICAL DATA:  Abdominal distension. Nausea. Diverticulitis suspected. EXAM: CT ABDOMEN AND PELVIS WITHOUT CONTRAST TECHNIQUE: Multidetector CT imaging of the abdomen and pelvis was performed following the standard protocol without IV contrast. COMPARISON:  CT 08/19/2015 FINDINGS: Lower chest: Linear atelectasis in the right lower lobe. No pleural fluid. Normal heart size with coronary artery calcifications. Hepatobiliary: Vague low-density in the right lobe of the liver corresponds to hemangioma on prior contrast-enhanced exam. No evidence of new hepatic lesion. Cholecystectomy without biliary dilatation. Pancreas: Inflammatory fat stranding in the left upper quadrant fat abuts the tail the pancreas, however pancreatic inflammation is not favored at this time. Scattered parenchymal calcifications. No ductal dilatation. Spleen: Normal in size without focal abnormality. Adrenals/Urinary Tract: No adrenal nodule. No hydronephrosis. There is symmetric bilateral perinephric edema, also seen on prior exam. Multiple bilateral low-density lesions consistent with cysts. There are also 2 lesions are not definitively cysts. In the left kidney there is a 16 mm lesion with Hounsfield units of 27, this previously measured 12 mm in 2017. In the upper right kidney there is a subcentimeter hyperdense lesion with Hounsfield units of 36. Isodense to renal parenchymal lesion in the anterior mid left kidney  corresponds to a probable hyperdense cyst on prior exam. This was not definitively seen on prior. No visualized renal calculi. Unremarkable urinary bladder. Stomach/Bowel: There is irregular gastric wall thickening about the cardia and greater curvature, for example series 3, image 21. Wall thickening measures at least 3.5 cm. The administered enteric contrast has exited the stomach, with gastric  nondistention limiting detailed assessment. There is adjacent fat stranding in the perigastric fat in the left upper quadrant, prominent in degree. No extraluminal air or enteric contrast. Tiny intramural lipoma in the duodenum. This is of no clinical significance. Mildly dilated small bowel in the lower abdomen measuring up to 3.2 cm. Distal small bowel are nondistended, however no discrete transition point is seen. Extensive colonic diverticulosis. No discrete diverticulitis or colonic inflammation. Appendix is not confidently visualized. No evidence of appendicitis. Vascular/Lymphatic: Aortic and branch atherosclerosis. No aortic aneurysm. Scattered small upper abdominal and perigastric nodes. No enlarged lymph nodes by size criteria. Reproductive: Prostate is unremarkable. Other: Prominent fat stranding and inflammation in the left upper quadrant, which appears to be related to adjacent gastric wall thickening. Small amount of fluid or soft tissue thickening in the left pericolic gutter, series 3, images 35 and 43, may be reactive but is nonspecific. Broad-based lower ventral abdominal wall laxity with rectus diastasis. No free air. No focal fluid collection. Musculoskeletal: Bones diffusely under mineralized. Posterior lumbar fusion L2 through S1. Focal kyphosis at L1-L2 with disc space obliteration. This is not significantly changed from prior exam. Right hip arthroplasty. IMPRESSION: 1. Irregular gastric wall thickening about the cardia and greater curvature, greater than 3.5 cm. Differential considerations include neoplasm, peptic ulcer disease or less likely focal gastritis. There is significant adjacent inflammation in the left upper quadrant, but no free air or frank perforation. This inflammation abuts the tail the pancreas, however appears to be related to stomach rather than pancreatic. Endoscopy recommended for further evaluation. 2. Small amount of free fluid or soft tissue density in the left  pericolic gutter, possibly reactive but indeterminate. 3. Mildly dilated small bowel in the lower abdomen without discrete transition point, suspicious for ileus. 4. Extensive colonic diverticulosis without diverticulitis. 5. Bilateral renal cysts. There also 2 indeterminate lesions, 1 in the right and 1 in the left kidney. Left renal lesion has slightly increased in size from 2017, currently 16 mm. Recommend MRI characterization after resolution of acute event on an elective outpatient basis. Aortic Atherosclerosis (ICD10-I70.0). Electronically Signed   By: Keith Rake M.D.   On: 06/20/2020 15:43      IMPRESSION:   *   Acute, 3-day history of nausea, nonbloody emesis/dry heaves, abdominal bloating. CT scan showing irregular appearance in the gastric cardia and greater curvature.  History of 20 pound weight loss over 4 months.  Compliant with pantoprazole 20 mg bid.  Overall picture concerning for neoplasm.  *   Normocytic anemia.  Hb 10.7, only prior for comparison is 13.6 in June 2019.  *    Leukocytosis.  *     GI bleed, presumed diverticular in 08/2015.  History of adenomatous and hyperplastic colon polyps along with diverticulosis and sigmoid biopsies confirming chronic active colitis during colonoscopy in 08/2015.  Sustained bowel perforation requiring partial colectomy during colonoscopy prior to that around 2013.  *    chronic Plavix.  History of coronary stenting and CVA.  Due to the nausea and vomiting he has not taken his Plavix for 3 days, the last dose was on Monday 4/25.  PLAN:     *    EGD tomorrow.  *    OK for clear liquids but n.p.o. after midnight  *     Protonix 40 mg IV bid for now.   Azucena Freed  06/20/2020, 4:12 PM Phone [phone]  GI ATTENDING  History, laboratories, x-rays reviewed.  Patient seen and examined.  Agree with comprehensive consultation note as outlined above.  Patient presents with nausea, vomiting, abdominal pain.  No significant  abnormality is appearance of the stomach on CT.  Changes are concerning for neoplasia versus ulcer disease.  Plans for upper endoscopy tomorrow morning.  The patient is high risk given his comorbidities and the need for Plavix.  The nature of the procedure, as well as the risks, benefits, and alternatives were carefully and thoroughly reviewed with the patient. Ample time for discussion and questions allowed. The patient understood, was satisfied, and agreed to proceed.  Docia Chuck. Geri Seminole., M.D. [facility] Division of Gastroenterology

## 2020-06-20 NOTE — H&P (View-Only) (Signed)
[facility] Consult: 4:12 PM 06/20/2020  LOS: 0 days    Referring Provider: Dr. Vanita Panda in the ED. Primary Care Physician:  System, Provider Not In Primary Gastroenterologist:  unassigned    Reason for Consultation: Nausea, vomiting and irregular appearance to the stomach on CT.   HPI: Wayne Meadows is a 86 y.o. male.  PMH COPD.  CAD on cardiac stenting.  CVA.  Chronic Plavix.  Colon perforation following colonoscopy (not in [place]) around 2013 required partial colectomy.  Previous remote EGDs. 08/2015 colonoscopy: For evaluation of GI bleeding.  An adequate bowel prep.  Polyps removed from rectum and transverse colon.  Sigmoid colon granularity.  Diverticulosis in the sigmoid, descending and transverse colon.  Nonbleeding internal hemorrhoids Pathology on polyps were tubular adenoma and hyperplastic.  Sigmoid biopsies chronic active colitis  Compliant with pantoprazole 20 mg bid.  Takes Plavix but his last dose was on 4/25, Monday.  Daily low-dose aspirin.  Drinks an occasional glass of wine but not a heavy drinker. Between January 2022 and now he is lost 20 pounds associated with reduced appetite.  Was not having any active GI symptoms until this Tuesday, 2 days ago, when he developed active nonbloody emesis of almost everything he tried to swallow.  Eventually having just dry heaves.  Stools brown but smaller in quantity.  Abdomen feels tight and bloated but no true pain.  No lower extremity edema.  No palpitations, dizziness.  Generally feels weak.  Vital signs stable. CTAP w/o contrast: Irregular gastric wall thickening at cardia and greater curvature.  Differential includes neoplasm, PUD or less likely focal gastritis.  Significant adjacent left upper quadrant inflammation, no perforation.  Inflammation  abuts tail of pancreas but appears to be gastric in origin rather than pancreatic.  Small amount of free fluid or soft tissue density in the left pericolic gutter, possibly reactive.  Mildly dilated small bowel loops without obstruction, suspicious for ileus.  Extensive colonic diverticulosis.  Bilateral renal cysts.  One of the cysts in the left kidney has increased in size and recommend MRI for further eval.  WBCs 14.8.  Hb 10.7.  MCV 93.  Normal platelet. GFR 31 with normal BUN, creatinine 2.1.  LFTs, lipase normal    Past Medical History:  Diagnosis Date  . Arthritis   . Carotid stenosis   . COPD (chronic obstructive pulmonary disease) ([place])   . Coronary artery disease    S/p PCI 2011;  NSTEMI 12/12:  LHC/PCI 02/23/11: LAD 60% after the septal perforator, D1 occluded with distal collaterals, proximal RI 30-40%, AV circumflex stent patent with 60% stenosis after the stent, RCA 99%, EF 60-65%.  His RCA was treated with a bare-metal stent  . CVA (cerebral infarction) 2011   Right cerebral; total obstruction of the right ICA  . Diverticulitis   . Hypertension   . Rectal bleeding 07/2015  . Renal carcinoma ([place])   . Stroke ([place])   . Tobacco abuse, in remission     Past Surgical History:  Procedure Laterality Date  . COLON SURGERY    .  COLONOSCOPY N/A 08/22/2015   Procedure: COLONOSCOPY;  Surgeon: [name], MD;  Location: [facility];  Service: Endoscopy;  Laterality: N/A;  . hip relacement    . KIDNEY SURGERY    . LEFT HEART CATHETERIZATION WITH CORONARY ANGIOGRAM N/A 02/23/2011   Procedure: LEFT HEART CATHETERIZATION WITH CORONARY ANGIOGRAM;  Surgeon: [name], MD;  Location: [facility] CATH LAB;  Service: Cardiovascular;  Laterality: N/A;  . LEFT HEART CATHETERIZATION WITH CORONARY ANGIOGRAM N/A 03/18/2011   Procedure: LEFT HEART CATHETERIZATION WITH CORONARY ANGIOGRAM;  Surgeon: [name], MD;  Location: [facility] CATH LAB;  Service: Cardiovascular;  Laterality: N/A;  .  PERCUTANEOUS CORONARY STENT INTERVENTION (PCI-S) N/A 02/23/2011   Procedure: PERCUTANEOUS CORONARY STENT INTERVENTION (PCI-S);  Surgeon: [name], MD;  Location: [facility] CATH LAB;  Service: Cardiovascular;  Laterality: N/A;  . TEMPORARY PACEMAKER INSERTION N/A 02/23/2011   Procedure: TEMPORARY PACEMAKER INSERTION;  Surgeon: [name], MD;  Location: [facility] CATH LAB;  Service: Cardiovascular;  Laterality: N/A;    Prior to Admission medications   Medication Sig Start Date End Date Taking? Authorizing Provider  amLODipine (NORVASC) 5 MG tablet Take 5 mg by mouth daily.    [provider]  aspirin EC 81 MG tablet Take 81 mg by mouth daily.      [provider]  azithromycin (ZITHROMAX) 250 MG tablet Take 1 tablet (250 mg total) by mouth daily. Take first 2 tablets together, then 1 every day until finished. Patient not taking: Reported on 07/31/2017 07/08/16   Rolland Porter, MD  baclofen (LIORESAL) 10 MG tablet Take 10 mg by mouth at bedtime.     [provider]  clopidogrel (PLAVIX) 75 MG tablet Take 75 mg by mouth daily.    [provider]  enalapril (VASOTEC) 20 MG tablet Take 20 mg by mouth at bedtime.     [provider]  EPINEPHrine (EPI-PEN) 0.3 mg/0.3 mL DEVI Inject 0.3 mg into the muscle once as needed (for anaphylactic reaction).     [provider]  furosemide (LASIX) 40 MG tablet Take 40 mg by mouth 2 (two) times daily.    [provider]  gabapentin (NEURONTIN) 300 MG capsule Take 300-600 mg by mouth See admin instructions. Take 300 mg by mouth in the morning then 300 mg at noon then 600 mg at bedtime    [provider]  HYDROcodone-acetaminophen (NORCO) 10-325 MG tablet Take 1 tablet by mouth 2 (two) times daily.     [provider]  Ipratropium-Albuterol (COMBIVENT) 20-100 MCG/ACT AERS respimat Inhale 1 puff into the lungs 4 (four) times daily.    [provider]  Melatonin 3 MG TABS Take 6 mg by mouth  at bedtime.    [provider]  MESALAMINE PO Take 1,500 mg by mouth daily.    [provider]  metoprolol tartrate (LOPRESSOR) 25 MG tablet Take 12.5 mg by mouth 2 (two) times daily.    [provider]  mometasone (ASMANEX) 220 MCG/INH inhaler Inhale 1 puff into the lungs at bedtime.      [provider]  Multiple Vitamin (MULTIVITAMIN) tablet Take 1 tablet by mouth daily.    [provider]  nitroGLYCERIN (NITROSTAT) 0.4 MG SL tablet Place 0.4 mg under the tongue every 5 (five) minutes as needed for chest pain.    [provider]  pantoprazole (PROTONIX) 20 MG tablet Take 1 tablet (20 mg total) by mouth 2 (two) times daily before a meal. 08/09/15  Orpah Greek, MD  polyethylene glycol (MIRALAX / GLYCOLAX) packet Take 17 g by mouth daily. Patient taking differently: Take 17 g by mouth daily as needed for mild constipation or moderate constipation.  08/22/15   Lavina Hamman, MD  pravastatin (PRAVACHOL) 40 MG tablet Take 40 mg by mouth at bedtime.    [provider]    Scheduled Meds: . iohexol       Infusions: . sodium chloride 125 mL/hr at 06/20/20 1056   PRN Meds:    Allergies as of 06/20/2020 - Review Complete 06/20/2020  Allergen Reaction Noted  . Bee venom Anaphylaxis 02/22/2011  . Influenza vaccines Other (See Comments) 07/13/2013    Family History  Problem Relation Age of Onset  . Heart attack Other 28    Social History   Socioeconomic History  . Marital status: Divorced    Spouse name: Not on file  . Number of children: Not on file  . Years of education: Not on file  . Highest education level: Not on file  Occupational History  . Not on file  Tobacco Use  . Smoking status: Former Smoker    Types: Cigarettes    Quit date: 02/24/2007    Years since quitting: 13.3  . Smokeless tobacco: Never Used  Substance and Sexual Activity  . Alcohol use: Not Currently    Alcohol/week: 1.0 standard  drink    Types: 1 Standard drinks or equivalent per week    Comment: socially   . Drug use: No  . Sexual activity: Not on file  Other Topics Concern  . Not on file  Social History Narrative  . Not on file   Social Determinants of Health   Financial Resource Strain: Not on file  Food Insecurity: Not on file  Transportation Needs: Not on file  Physical Activity: Not on file  Stress: Not on file  Social Connections: Not on file  Intimate Partner Violence: Not on file    REVIEW OF SYSTEMS: Constitutional: Weakness ENT:  No nose bleeds Pulm: No shortness of breath.  Occasional cough. CV:  No palpitations, no LE edema.  GU:  No hematuria, no frequency GI: See HPI. Heme: No unusual bleeding or bruising. Transfusions: No records of previous blood transfusions in epic. Neuro:  No headaches, no peripheral tingling or numbness.  No syncope, no seizures. Derm:  No itching, no rash or sores.  Endocrine:  No sweats or chills.  No polyuria or dysuria Immunization: Has received 2 COVID vaccines. Travel:  None beyond local counties in last few months.    PHYSICAL EXAM: Vital signs in last 24 hours: Vitals:   06/20/20 1445 06/20/20 1600  BP: (!) 173/67 (!) 154/70  Pulse: (!) 104 94  Resp: (!) 23 20  Temp:    SpO2: 97% 97%   Wt Readings from Last 3 Encounters:  06/20/20 93.9 kg  07/31/17 100.2 kg  09/18/16 103.4 kg    General: Patient looks unwell, tired.  Does not look unstable Head: No facial asymmetry or swelling.  No signs of head trauma. Eyes: Nose lateral icterus or conjunctival pallor. Ears: Not hard of hearing Nose: No congestion or discharge Mouth: Oropharynx moist, pink, clear.  Bulk of his teeth are absent.  Upper dentures in place.  Tongue midline. Neck: No JVD, no masses, no thyromegaly Lungs: Slight cough.  No dyspnea.  Lungs clear bilaterally Heart: RRR.  No MRG.  S1, S2 present Abdomen: Distended with small incisional hernia in the lower abdomen.  No HSM,  masses, bruits.  Not tender.  Active bowel sounds..   Rectal: Deferred Musc/Skeltl: No joint redness, swelling or significant deformities. Extremities: No lower extremity edema. Neurologic: Alert and oriented x3.  Moves all 4 limbs without tremor, strength not tested. Skin: No rash, no sores, no telangiectasia Nodes: No cervical adenopathy Psych: Cooperative, calm, fluid speech.  Intake/Output from previous day: No intake/output data recorded. Intake/Output this shift: No intake/output data recorded.  LAB RESULTS: Recent Labs    06/20/20 1039  WBC 14.8*  HGB 10.7*  HCT 33.8*  PLT 421*   BMET Lab Results  Component Value Date   NA 137 06/20/2020   NA 142 07/31/2017   NA 141 09/18/2016   K 3.4 (L) 06/20/2020   K 3.9 07/31/2017   K 3.9 09/18/2016   CL 104 06/20/2020   CL 107 07/31/2017   CL 104 09/18/2016   CO2 24 06/20/2020   CO2 28 07/31/2017   CO2 26 09/18/2016   GLUCOSE 145 (H) 06/20/2020   GLUCOSE 124 (H) 07/31/2017   GLUCOSE 148 (H) 09/18/2016   BUN 10 06/20/2020   BUN 10 07/31/2017   BUN 13 09/18/2016   CREATININE 2.10 (H) 06/20/2020   CREATININE 1.42 (H) 07/31/2017   CREATININE 1.49 (H) 09/18/2016   CALCIUM 8.5 (L) 06/20/2020   CALCIUM 8.4 (L) 07/31/2017   CALCIUM 9.3 09/18/2016   LFT Recent Labs    06/20/20 1039  PROT 6.8  ALBUMIN 2.1*  AST 23  ALT 14  ALKPHOS 82  BILITOT 0.8   PT/INR Lab Results  Component Value Date   INR 1.13 07/31/2017   INR 1.30 04/11/2016   INR 1.20 08/19/2015   Hepatitis Panel No results for input(s): HEPBSAG, HCVAB, HEPAIGM, HEPBIGM in the last 72 hours. C-Diff No components found for: CDIFF Lipase     Component Value Date/Time   LIPASE 51 06/20/2020 1039    Drugs of Abuse     Component Value Date/Time   LABOPIA POSITIVE (A) 07/31/2017 2148   COCAINSCRNUR NONE DETECTED 07/31/2017 2148   LABBENZ NONE DETECTED 07/31/2017 2148   AMPHETMU NONE DETECTED 07/31/2017 2148   THCU NONE DETECTED 07/31/2017 2148    LABBARB NONE DETECTED 07/31/2017 2148     RADIOLOGY STUDIES: CT ABDOMEN PELVIS WO CONTRAST  Result Date: 06/20/2020 CLINICAL DATA:  Abdominal distension. Nausea. Diverticulitis suspected. EXAM: CT ABDOMEN AND PELVIS WITHOUT CONTRAST TECHNIQUE: Multidetector CT imaging of the abdomen and pelvis was performed following the standard protocol without IV contrast. COMPARISON:  CT 08/19/2015 FINDINGS: Lower chest: Linear atelectasis in the right lower lobe. No pleural fluid. Normal heart size with coronary artery calcifications. Hepatobiliary: Vague low-density in the right lobe of the liver corresponds to hemangioma on prior contrast-enhanced exam. No evidence of new hepatic lesion. Cholecystectomy without biliary dilatation. Pancreas: Inflammatory fat stranding in the left upper quadrant fat abuts the tail the pancreas, however pancreatic inflammation is not favored at this time. Scattered parenchymal calcifications. No ductal dilatation. Spleen: Normal in size without focal abnormality. Adrenals/Urinary Tract: No adrenal nodule. No hydronephrosis. There is symmetric bilateral perinephric edema, also seen on prior exam. Multiple bilateral low-density lesions consistent with cysts. There are also 2 lesions are not definitively cysts. In the left kidney there is a 16 mm lesion with Hounsfield units of 27, this previously measured 12 mm in 2017. In the upper right kidney there is a subcentimeter hyperdense lesion with Hounsfield units of 36. Isodense to renal parenchymal lesion in the anterior mid left kidney  corresponds to a probable hyperdense cyst on prior exam. This was not definitively seen on prior. No visualized renal calculi. Unremarkable urinary bladder. Stomach/Bowel: There is irregular gastric wall thickening about the cardia and greater curvature, for example series 3, image 21. Wall thickening measures at least 3.5 cm. The administered enteric contrast has exited the stomach, with gastric  nondistention limiting detailed assessment. There is adjacent fat stranding in the perigastric fat in the left upper quadrant, prominent in degree. No extraluminal air or enteric contrast. Tiny intramural lipoma in the duodenum. This is of no clinical significance. Mildly dilated small bowel in the lower abdomen measuring up to 3.2 cm. Distal small bowel are nondistended, however no discrete transition point is seen. Extensive colonic diverticulosis. No discrete diverticulitis or colonic inflammation. Appendix is not confidently visualized. No evidence of appendicitis. Vascular/Lymphatic: Aortic and branch atherosclerosis. No aortic aneurysm. Scattered small upper abdominal and perigastric nodes. No enlarged lymph nodes by size criteria. Reproductive: Prostate is unremarkable. Other: Prominent fat stranding and inflammation in the left upper quadrant, which appears to be related to adjacent gastric wall thickening. Small amount of fluid or soft tissue thickening in the left pericolic gutter, series 3, images 35 and 43, may be reactive but is nonspecific. Broad-based lower ventral abdominal wall laxity with rectus diastasis. No free air. No focal fluid collection. Musculoskeletal: Bones diffusely under mineralized. Posterior lumbar fusion L2 through S1. Focal kyphosis at L1-L2 with disc space obliteration. This is not significantly changed from prior exam. Right hip arthroplasty. IMPRESSION: 1. Irregular gastric wall thickening about the cardia and greater curvature, greater than 3.5 cm. Differential considerations include neoplasm, peptic ulcer disease or less likely focal gastritis. There is significant adjacent inflammation in the left upper quadrant, but no free air or frank perforation. This inflammation abuts the tail the pancreas, however appears to be related to stomach rather than pancreatic. Endoscopy recommended for further evaluation. 2. Small amount of free fluid or soft tissue density in the left  pericolic gutter, possibly reactive but indeterminate. 3. Mildly dilated small bowel in the lower abdomen without discrete transition point, suspicious for ileus. 4. Extensive colonic diverticulosis without diverticulitis. 5. Bilateral renal cysts. There also 2 indeterminate lesions, 1 in the right and 1 in the left kidney. Left renal lesion has slightly increased in size from 2017, currently 16 mm. Recommend MRI characterization after resolution of acute event on an elective outpatient basis. Aortic Atherosclerosis (ICD10-I70.0). Electronically Signed   By: Keith Rake M.D.   On: 06/20/2020 15:43      IMPRESSION:   *   Acute, 3-day history of nausea, nonbloody emesis/dry heaves, abdominal bloating. CT scan showing irregular appearance in the gastric cardia and greater curvature.  History of 20 pound weight loss over 4 months.  Compliant with pantoprazole 20 mg bid.  Overall picture concerning for neoplasm.  *   Normocytic anemia.  Hb 10.7, only prior for comparison is 13.6 in June 2019.  *    Leukocytosis.  *     GI bleed, presumed diverticular in 08/2015.  History of adenomatous and hyperplastic colon polyps along with diverticulosis and sigmoid biopsies confirming chronic active colitis during colonoscopy in 08/2015.  Sustained bowel perforation requiring partial colectomy during colonoscopy prior to that around 2013.  *    chronic Plavix.  History of coronary stenting and CVA.  Due to the nausea and vomiting he has not taken his Plavix for 3 days, the last dose was on Monday 4/25.  PLAN:     *    EGD tomorrow.  *    OK for clear liquids but n.p.o. after midnight  *     Protonix 40 mg IV bid for now.   Azucena Freed  06/20/2020, 4:12 PM Phone [phone]  GI ATTENDING  History, laboratories, x-rays reviewed.  Patient seen and examined.  Agree with comprehensive consultation note as outlined above.  Patient presents with nausea, vomiting, abdominal pain.  No significant  abnormality is appearance of the stomach on CT.  Changes are concerning for neoplasia versus ulcer disease.  Plans for upper endoscopy tomorrow morning.  The patient is high risk given his comorbidities and the need for Plavix.  The nature of the procedure, as well as the risks, benefits, and alternatives were carefully and thoroughly reviewed with the patient. Ample time for discussion and questions allowed. The patient understood, was satisfied, and agreed to proceed.  Docia Chuck. Geri Seminole., M.D. [facility] Division of Gastroenterology

## 2020-06-21 ENCOUNTER — Encounter (HOSPITAL_COMMUNITY)
Admission: EM | Disposition: A | Discharge status: Home / Self Care | Payer: Self-pay | Source: Home / Self Care | Attending: Student in an Organized Health Care Education/Training Program

## 2020-06-21 ENCOUNTER — Encounter (HOSPITAL_COMMUNITY): Payer: Self-pay | Admitting: Internal Medicine

## 2020-06-21 ENCOUNTER — Observation Stay (HOSPITAL_COMMUNITY): Payer: No Typology Code available for payment source | Admitting: Certified Registered Nurse Anesthetist

## 2020-06-21 DIAGNOSIS — K319 Disease of stomach and duodenum, unspecified: Secondary | ICD-10-CM | POA: Diagnosis not present

## 2020-06-21 DIAGNOSIS — K222 Esophageal obstruction: Secondary | ICD-10-CM

## 2020-06-21 DIAGNOSIS — R112 Nausea with vomiting, unspecified: Secondary | ICD-10-CM | POA: Diagnosis not present

## 2020-06-21 HISTORY — PX: ESOPHAGOGASTRODUODENOSCOPY (EGD) WITH PROPOFOL: SHX5813

## 2020-06-21 LAB — COMPREHENSIVE METABOLIC PANEL
ALT: 18 U/L (ref 0–44)
AST: 32 U/L (ref 15–41)
Albumin: 1.9 g/dL — ABNORMAL LOW (ref 3.5–5.0)
Alkaline Phosphatase: 74 U/L (ref 38–126)
Anion gap: 6 (ref 5–15)
BUN: 9 mg/dL (ref 8–23)
CO2: 22 mmol/L (ref 22–32)
Calcium: 8.2 mg/dL — ABNORMAL LOW (ref 8.9–10.3)
Chloride: 107 mmol/L (ref 98–111)
Creatinine, Ser: 1.97 mg/dL — ABNORMAL HIGH (ref 0.61–1.24)
GFR, Estimated: 33 mL/min — ABNORMAL LOW (ref >60)
Glucose, Bld: 128 mg/dL — ABNORMAL HIGH (ref 70–99)
Potassium: 3.1 mmol/L — ABNORMAL LOW (ref 3.5–5.1)
Sodium: 135 mmol/L (ref 135–145)
Total Bilirubin: 0.8 mg/dL (ref 0.3–1.2)
Total Protein: 5.8 g/dL — ABNORMAL LOW (ref 6.5–8.1)

## 2020-06-21 LAB — CBC WITH DIFFERENTIAL/PLATELET
Abs Immature Granulocytes: 0.07 10*3/uL (ref 0.00–0.07)
Basophils Absolute: 0 10*3/uL (ref 0.0–0.1)
Basophils Relative: 0 %
Eosinophils Absolute: 0 10*3/uL (ref 0.0–0.5)
Eosinophils Relative: 0 %
HCT: 31.1 % — ABNORMAL LOW (ref 39.0–52.0)
Hemoglobin: 10 g/dL — ABNORMAL LOW (ref 13.0–17.0)
Immature Granulocytes: 1 %
Lymphocytes Relative: 10 %
Lymphs Abs: 1.3 10*3/uL (ref 0.7–4.0)
MCH: 30 pg (ref 26.0–34.0)
MCHC: 32.2 g/dL (ref 30.0–36.0)
MCV: 93.4 fL (ref 80.0–100.0)
Monocytes Absolute: 1 10*3/uL (ref 0.1–1.0)
Monocytes Relative: 8 %
Neutro Abs: 9.9 10*3/uL — ABNORMAL HIGH (ref 1.7–7.7)
Neutrophils Relative %: 81 %
Platelets: 393 10*3/uL (ref 150–400)
RBC: 3.33 MIL/uL — ABNORMAL LOW (ref 4.22–5.81)
RDW: 13.2 % (ref 11.5–15.5)
WBC: 12.4 10*3/uL — ABNORMAL HIGH (ref 4.0–10.5)
nRBC: 0 % (ref 0.0–0.2)

## 2020-06-21 SURGERY — ESOPHAGOGASTRODUODENOSCOPY (EGD) WITH PROPOFOL
Anesthesia: Monitor Anesthesia Care

## 2020-06-21 MED ORDER — IPRATROPIUM-ALBUTEROL 0.5-2.5 (3) MG/3ML IN SOLN
3.0000 mL | Freq: Four times a day (QID) | RESPIRATORY_TRACT | Status: DC
  Administered 2020-06-21: 3 mL via RESPIRATORY_TRACT
  Filled 2020-06-21: qty 3

## 2020-06-21 MED ORDER — PROPOFOL 500 MG/50ML IV EMUL
INTRAVENOUS | Status: DC | PRN
  Administered 2020-06-21: 75 ug/kg/min via INTRAVENOUS

## 2020-06-21 MED ORDER — LIDOCAINE 2% (20 MG/ML) 5 ML SYRINGE
INTRAMUSCULAR | Status: DC | PRN
  Administered 2020-06-21: 100 mg via INTRAVENOUS

## 2020-06-21 MED ORDER — PROPOFOL 10 MG/ML IV BOLUS
INTRAVENOUS | Status: DC | PRN
  Administered 2020-06-21 (×2): 20 mg via INTRAVENOUS

## 2020-06-21 MED ORDER — POTASSIUM CHLORIDE 10 MEQ/100ML IV SOLN: 10.0000 meq | INTRAVENOUS | Status: DC

## 2020-06-21 MED ORDER — LACTATED RINGERS IV SOLN
INTRAVENOUS | Status: AC | PRN
  Administered 2020-06-21: 1000 mL via INTRAVENOUS

## 2020-06-21 MED ORDER — POTASSIUM CHLORIDE 10 MEQ/100ML IV SOLN
10.0000 meq | INTRAVENOUS | Status: AC
  Administered 2020-06-21 (×4): 10 meq via INTRAVENOUS
  Filled 2020-06-21 (×4): qty 100

## 2020-06-21 SURGICAL SUPPLY — 14 items

## 2020-06-21 NOTE — Transfer of Care (Signed)
Immediate Anesthesia Transfer of Care Note  Patient: DONTEZ HAUSS  Procedure(s) Performed: ESOPHAGOGASTRODUODENOSCOPY (EGD) WITH PROPOFOL (N/A )  Patient Location: PACU  Anesthesia Type:MAC  Level of Consciousness: awake, alert  and oriented  Airway & Oxygen Therapy: Patient Spontanous Breathing  Post-op Assessment: Report given to RN and Post -op Vital signs reviewed and stable  Post vital signs: Reviewed and stable  Last Vitals:  Vitals Value Taken Time  BP    Temp    Pulse 92 06/21/20 1152  Resp 25 06/21/20 1152  SpO2 95 % 06/21/20 1152  Vitals shown include unvalidated device data.  Last Pain:  Vitals:   06/21/20 1049  TempSrc: Temporal  PainSc: 0-No pain         Complications: No complications documented.

## 2020-06-21 NOTE — Anesthesia Postprocedure Evaluation (Signed)
Anesthesia Post Note  Patient: Wayne Meadows  Procedure(s) Performed: ESOPHAGOGASTRODUODENOSCOPY (EGD) WITH PROPOFOL (N/A )     Patient location during evaluation: Endoscopy Anesthesia Type: MAC Level of consciousness: awake and alert Pain management: pain level controlled Vital Signs Assessment: post-procedure vital signs reviewed and stable Respiratory status: spontaneous breathing, nonlabored ventilation and respiratory function stable Cardiovascular status: blood pressure returned to baseline and stable Postop Assessment: no apparent nausea or vomiting Anesthetic complications: no   No complications documented.  Last Vitals:  Vitals:   06/21/20 1200 06/21/20 1210  BP: (!) 143/37 (!) 157/46  Pulse: 89 94  Resp: (!) 22 17  Temp:    SpO2: 93% 95%    Last Pain:  Vitals:   06/21/20 1153  TempSrc: Temporal  PainSc: 0-No pain                 Lynda Rainwater

## 2020-06-21 NOTE — Op Note (Signed)
[facility] Patient Name: Wayne Meadows Procedure Date : 06/21/2020 MRN: [id number] Attending MD: Docia Chuck. Henrene Pastor , MD Date of Birth: Dec 23, 1934 CSN: [id number] Age: 86 Admit Type: Inpatient Procedure:                Upper GI endoscopy Indications:              Abdominal pain, Nausea with vomiting, Weight loss Providers:                Docia Chuck. Henrene Pastor, MD, Josie Dixon, RN, Laverda Sorenson, Technician, Dorthea Cove, CRNA Referring MD:             [facility] hospitalist Medicines:                Monitored Anesthesia Care Complications:            No immediate complications. Estimated Blood Loss:     Estimated blood loss: none. Procedure:                Pre-Anesthesia Assessment:                           - Prior to the procedure, a History and Physical                            was performed, and patient medications and                            allergies were reviewed. The patient's tolerance of                            previous anesthesia was also reviewed. The risks                            and benefits of the procedure and the sedation                            options and risks were discussed with the patient.                            All questions were answered, and informed consent                            was obtained. Prior Anticoagulants: The patient has                            taken Plavix (clopidogrel), last dose was 1 day                            prior to procedure. ASA Grade Assessment: III - A                            patient with severe systemic disease. After  reviewing the risks and benefits, the patient was                            deemed in satisfactory condition to undergo the                            procedure.                           After obtaining informed consent, the endoscope was                            passed under direct vision. Throughout the                             procedure, the patient's blood pressure, pulse, and                            oxygen saturations were monitored continuously. The                            [id number]-H190 ([id number] Olympus gastroscope was                            introduced through the mouth, and advanced to the                            second part of duodenum. The upper GI endoscopy was                            accomplished without difficulty. The patient                            tolerated the procedure well. Scope In: Scope Out: Findings:      The esophagus revealed a large caliber distal esophageal ring and       irregular Z-line. Otherwise normal      The stomach was grossly normal. There was the suggestion of slightly       congested mucosa in the proximal portion. No obvious mucosal       abnormality. Biopsies not taken as the patient is on Plavix. Small       hiatal hernia.      The examined duodenum was normal.      The cardia and gastric fundus were normal on retroflexion. Impression:               1. Incidental esophageal stricture                           2. Otherwise essentially normal EGD. Possibly                            mildly congested proximal esophageal mucosa as                            described. Recommendation:  1. Resume previous diet                           2. Resume previous medications                           3. Given the CT scan findings, patient symptom                            complex, and unremarkable upper endoscopic mucosal                            examination, I would recommend outpatient upper                            endoscopic ultrasound. I will forward a note to my                            [facility] colleagues who can arrange the examination as                            an outpatient.                           Okay to go home from GI perspective. Discussed with                            patient. He was provided a copy of this note. Procedure Code(s):         --- Professional ---                           [phone], Esophagogastroduodenoscopy, flexible,                            transoral; diagnostic, including collection of                            specimen(s) by brushing or washing, when performed                            (separate procedure) Diagnosis Code(s):        --- Professional ---                           R10.9, Unspecified abdominal pain                           R11.2, Nausea with vomiting, unspecified                           R63.4, Abnormal weight loss CPT copyright 2019 [employer]. All rights reserved. The codes documented in this report are preliminary and upon coder review may  be revised to meet current compliance requirements. Docia Chuck. Henrene Pastor, MD 06/21/2020 12:01:48 PM This report has been signed electronically. Number of Addenda: 0

## 2020-06-21 NOTE — Progress Notes (Signed)
Subjective:  Patient is doing well this morning. He has had no nausea or vomiting since being admitted. He states his mouth is dry but understands he is NPO until his EGD is complete. He denies any dysphagia or odynophagia. He has had an EGD before from the [place] that only showed hiatal hernia; his symptoms of epigastric pain at that time went away after the procedure. 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 He also has a history of renal cell carcinoma that was followed by the [place].  Objective:  Vital signs in last 24 hours: Vitals:   06/21/20 1150 06/21/20 1153 06/21/20 1200 06/21/20 1210  BP: (!) 150/37  (!) 143/37 (!) 157/46  Pulse: 91 93 89 94  Resp: (!) 24 (!) 24 (!) 22 17  Temp:  97.9 F (36.6 C)    TempSrc:  Temporal    SpO2: 95%  93% 95%  Weight:      Height:       Weight change:   Intake/Output Summary (Last 24 hours) at 06/21/2020 1225 Last data filed at 06/21/2020 1147 Gross per 24 hour  Intake 5820 ml  Output --  Net 5820 ml   Physical Exam Constitutional:      Appearance: Normal appearance.  HENT:     Head: Normocephalic and atraumatic.  Eyes:     Extraocular Movements: Extraocular movements intact.  Cardiovascular:     Rate and Rhythm: Normal rate and regular rhythm.     Pulses: Normal pulses.     Heart sounds: Normal heart sounds.  Pulmonary:     Effort: Pulmonary effort is normal.     Breath sounds: Normal breath sounds.  Abdominal:     General: Bowel sounds are normal.     Palpations: Abdomen is soft.  Musculoskeletal:        General: Normal range of motion.     Cervical back: Normal range of motion.  Neurological:     General: No focal deficit present.     Mental Status: He is alert and oriented to person, place, and time.  Psychiatric:        Mood and Affect: Mood normal.        Behavior: Behavior normal.        Thought Content: Thought content normal.    Assessment/Plan:  Active Problems:   Abnormal  CT of the abdomen   Refractory nausea and vomiting   Gastric lesion   Esophageal stricture  1. Refractory nausea and vomiting Patient is doing well this morning. No nausea, vomiting, or dry-heaving since admission. He has no pain. Febrile at 100F. Leukocytosis and mild anemia present. He is HDS. Leukocytosis and slight fever could represent infection, however WBC is declining and the clinical presentation with MRI are more concerning for neoplasm at this time. -Awaiting results of EGD and further recommendations from GI to guide further management, appreciate assistance -Continue NPO until after procedure -Awaiting occult blood stool to evaluate any bleeding in light of H&H trending down -Continue ondansetron 4 mg IV every 6 hours prn for nausea -Trend CBC  2. Hypertension Patient's blood pressure is decreased to 159/68 this morning from 170s/70s in the ED. -Continue clonidine 0.1 mg daily -Continue home norvasc 10mg  daily and enalapril 20mg  in the morning and 10mg  at night  3. COPD Patient is not wheezing today. SpO2 of 94% likely represents baseline due to COPD. -Continue home albuterol -Continue home Dulera  4. Stage 3b CKD  Patient's creatinine decreased to 1.97 from 2.10 and GFR stable at 33. Creatinine likely decreased slightly due to IV fluids after dehydration secondary to vomiting. -Trend CMP  5. GERD Patient has chronic reflux controlled with pantoprazole. -Continue Protonix 4 mg IV bid  6. History of coronary stenting and CVA Patient is on chronic Plavix 75 mg daily but has not had a dose in 4 days now. -Continue to hold until after EGD  7. Bilateral indeterminate renal lesions Patient's MRI demonstrates bilateral lesions in the kidney; the left mass has grown in size since 2017. Patient continues to be stable without flank pain or dysuria. -Will follow up outpatient for further evaluation -Will attempt to receive records from the [place] concerning previous renal cell  carcinoma   LOS: 0 days   Mikal Plane, Medical Student 06/21/2020, 12:25 PM Pager: [phone] After 5pm on weekdays and 1pm on weekends: On Call pager [phone]

## 2020-06-21 NOTE — Anesthesia Preprocedure Evaluation (Signed)
Anesthesia Evaluation  Patient identified by MRN, date of birth, ID band Patient awake    Reviewed: Allergy & Precautions, NPO status , Patient's Chart, lab work & pertinent test results, reviewed documented beta blocker date and time   Airway Mallampati: I  TM Distance: >3 FB Neck ROM: Full    Dental  (+) Upper Dentures, Dental Advisory Given   Pulmonary COPD, former smoker,    breath sounds clear to auscultation       Cardiovascular hypertension, Pt. on medications and Pt. on home beta blockers + CAD, + Past MI and + Peripheral Vascular Disease   Rhythm:Regular Rate:Normal     Neuro/Psych CVA negative psych ROS   GI/Hepatic negative GI ROS, Neg liver ROS, GERD  Medicated,  Endo/Other  negative endocrine ROS  Renal/GU Renal disease  negative genitourinary   Musculoskeletal  (+) Arthritis , Osteoarthritis,    Abdominal   Peds negative pediatric ROS (+)  Hematology negative hematology ROS (+)   Anesthesia Other Findings   Reproductive/Obstetrics negative OB ROS                             Lab Results  Component Value Date   WBC 12.4 (H) 06/21/2020   HGB 10.0 (L) 06/21/2020   HCT 31.1 (L) 06/21/2020   MCV 93.4 06/21/2020   PLT 393 06/21/2020   Lab Results  Component Value Date   CREATININE 1.97 (H) 06/21/2020   BUN 9 06/21/2020   NA 135 06/21/2020   K 3.1 (L) 06/21/2020   CL 107 06/21/2020   CO2 22 06/21/2020   Lab Results  Component Value Date   INR 1.13 07/31/2017   INR 1.30 04/11/2016   INR 1.20 08/19/2015   07/2015 EKG: SR, PAC's noted.   Anesthesia Physical  Anesthesia Plan  ASA: III  Anesthesia Plan: MAC   Post-op Pain Management:    Induction: Intravenous  PONV Risk Score and Plan: 1 and Ondansetron and Treatment may vary due to age or medical condition  Airway Management Planned: Natural Airway and Nasal Cannula  Additional Equipment:   Intra-op Plan:    Post-operative Plan:   Informed Consent: I have reviewed the patients History and Physical, chart, labs and discussed the procedure including the risks, benefits and alternatives for the proposed anesthesia with the patient or authorized representative who has indicated his/her understanding and acceptance.       Plan Discussed with: CRNA  Anesthesia Plan Comments:         Anesthesia Quick Evaluation

## 2020-06-21 NOTE — Anesthesia Procedure Notes (Signed)
Procedure Name: MAC Date/Time: 06/21/2020 11:35 AM Performed by: Dorthea Cove, CRNA Pre-anesthesia Checklist: Patient identified, Emergency Drugs available, Suction available, Patient being monitored and Timeout performed Patient Re-evaluated:Patient Re-evaluated prior to induction Oxygen Delivery Method: Nasal cannula Preoxygenation: Pre-oxygenation with 100% oxygen Induction Type: IV induction Placement Confirmation: positive ETCO2

## 2020-06-21 NOTE — Discharge Summary (Incomplete)
Name: Wayne Meadows MRN: [id number] DOB: April 01, 1934 86 y.o. PCP: System, Provider Not In  Date of Admission: 06/20/2020 10:30 AM Date of Discharge: *** Attending Physician: ***  Discharge Diagnosis: 1. Refractory Nausea/Vomiting  2. Gastric Wall Abnormality  3. Hypertension   Discharge Medications: Allergies as of 06/21/2020      Reactions   Bee Venom Anaphylaxis   Has epi pen   Influenza Vaccines Other (See Comments)   "Mortally sick for 2 weeks"    Med Rec must be completed prior to using this [facility]***       Disposition and follow-up:   Wayne Meadows was discharged from [facility] in {DISCHARGE CONDITION:19696} condition.  At the hospital follow up visit please address:  1.  ***  2.  Labs / imaging needed at time of follow-up: ***  3.  Pending labs/ test needing follow-up: ***  Follow-up Appointments:   Hospital Course by problem list: 1. Refractory Nausea/Vomiting  [name] was admitted to [facility] after developing nausea and vomiting for 3 days that led to inability to tolerate p.o. intake.  Symptoms likely secondary to gastric wall abnormality as described below.  Patient was treated symptomatically with Zofran and gentle IV fluid resuscitation.  Prior to discharge patient was able to tolerate p.o. intake.  2. Gastric Wall Abnormality  On initial evaluation, patient had a CT abdomen pelvis without contrast that demonstrated irregular gastric wall thickening greater than 3.5 cm concerning for neoplasm versus peptic ulcer disease. There was significant inflammation in the surrounding region including the pancreas. Of note patient noted a history of GERD that he was taking daily PPI for with adequate control of symptoms.  He did endorse a 20 pound weight loss in the last 4 months that was unintentional. GI was consulted and EGD was performed on 06/21/2020.  Other than an esophageal stricture EGD was largely normal, although there was  mild congestive proximal esophageal mucosa.  Biopsies were not obtained due to patient's previous Plavix use.  GI recommended outpatient endoscopic ultrasound that they will arrange.    3. Hypertension  On admission, patient was noted to be significantly hypertensive in the setting of inability to tolerate his p.o. medications.  His home medications were restarted and blood pressure improved to ***.   Discharge Exam:   BP (!) 157/46   Pulse 94   Temp 97.9 F (36.6 C) (Temporal)   Resp 17   Ht 6\' 2"  (1.88 m)   Wt 89.5 kg   SpO2 95%   BMI 25.33 kg/m  Discharge exam: ***  Pertinent Labs, Studies, and Procedures:  ***  CT A/P WO contrast:  FINDINGS: Lower chest: Linear atelectasis in the right lower lobe. No pleural fluid. Normal heart size with coronary artery calcifications.  Hepatobiliary: Vague low-density in the right lobe of the liver corresponds to hemangioma on prior contrast-enhanced exam. No evidence of new hepatic lesion. Cholecystectomy without biliary dilatation.  Pancreas: Inflammatory fat stranding in the left upper quadrant fat abuts the tail the pancreas, however pancreatic inflammation is not favored at this time. Scattered parenchymal calcifications. No ductal dilatation.  Spleen: Normal in size without focal abnormality.  Adrenals/Urinary Tract: No adrenal nodule. No hydronephrosis. There is symmetric bilateral perinephric edema, also seen on prior exam. Multiple bilateral low-density lesions consistent with cysts. There are also 2 lesions are not definitively cysts. In the left kidney there is a 16 mm lesion with Hounsfield units of 27, this previously measured 12 mm in  2017. In the upper right kidney there is a subcentimeter hyperdense lesion with Hounsfield units of 36. Isodense to renal parenchymal lesion in the anterior mid left kidney corresponds to a probable hyperdense cyst on prior exam. This was not definitively seen on prior. No visualized  renal calculi. Unremarkable urinary bladder.  Stomach/Bowel: There is irregular gastric wall thickening about the cardia and greater curvature, for example series 3, image 21. Wall thickening measures at least 3.5 cm. The administered enteric contrast has exited the stomach, with gastric nondistention limiting detailed assessment. There is adjacent fat stranding in the perigastric fat in the left upper quadrant, prominent in degree. No extraluminal air or enteric contrast. Tiny intramural lipoma in the duodenum. This is of no clinical significance. Mildly dilated small bowel in the lower abdomen measuring up to 3.2 cm. Distal small bowel are nondistended, however no discrete transition point is seen. Extensive colonic diverticulosis. No discrete diverticulitis or colonic inflammation. Appendix is not confidently visualized. No evidence of appendicitis.  Vascular/Lymphatic: Aortic and branch atherosclerosis. No aortic aneurysm. Scattered small upper abdominal and perigastric nodes. No enlarged lymph nodes by size criteria.  Reproductive: Prostate is unremarkable.  Other: Prominent fat stranding and inflammation in the left upper quadrant, which appears to be related to adjacent gastric wall thickening. Small amount of fluid or soft tissue thickening in the left pericolic gutter, series 3, images 35 and 43, may be reactive but is nonspecific. Broad-based lower ventral abdominal wall laxity with rectus diastasis. No free air. No focal fluid collection.  Musculoskeletal: Bones diffusely under mineralized. Posterior lumbar fusion L2 through S1. Focal kyphosis at L1-L2 with disc space obliteration. This is not significantly changed from prior exam. Right hip arthroplasty.  IMPRESSION: 1. Irregular gastric wall thickening about the cardia and greater curvature, greater than 3.5 cm. Differential considerations include neoplasm, peptic ulcer disease or less likely focal gastritis.  There is significant adjacent inflammation in the left upper quadrant, but no free air or frank perforation. This inflammation abuts the tail the pancreas, however appears to be related to stomach rather than pancreatic. Endoscopy recommended for further evaluation. 2. Small amount of free fluid or soft tissue density in the left pericolic gutter, possibly reactive but indeterminate. 3. Mildly dilated small bowel in the lower abdomen without discrete transition point, suspicious for ileus. 4. Extensive colonic diverticulosis without diverticulitis. 5. Bilateral renal cysts. There also 2 indeterminate lesions, 1 in the right and 1 in the left kidney. Left renal lesion has slightly increased in size from 2017, currently 16 mm. Recommend MRI characterization after resolution of acute event on an elective outpatient basis.  Aortic Atherosclerosis (ICD10-I70.0).  Discharge Instructions:   Signed: ***

## 2020-06-21 NOTE — Interval H&P Note (Signed)
History and Physical Interval Note:  06/21/2020 11:39 AM  Wayne Meadows  has presented today for surgery, with the diagnosis of Evaluate irregular stomach on CT scan.  Nausea, vomiting abdominal pain..  The various methods of treatment have been discussed with the patient and family. After consideration of risks, benefits and other options for treatment, the patient has consented to  Procedure(s): ESOPHAGOGASTRODUODENOSCOPY (EGD) WITH PROPOFOL (N/A) as a surgical intervention.  The patient's history has been reviewed, patient examined, no change in status, stable for surgery.  I have reviewed the patient's chart and labs.  Questions were answered to the patient's satisfaction.     Scarlette Shorts

## 2020-06-22 DIAGNOSIS — K449 Diaphragmatic hernia without obstruction or gangrene: Secondary | ICD-10-CM | POA: Diagnosis present

## 2020-06-22 DIAGNOSIS — R0789 Other chest pain: Secondary | ICD-10-CM | POA: Diagnosis present

## 2020-06-22 DIAGNOSIS — Z85528 Personal history of other malignant neoplasm of kidney: Secondary | ICD-10-CM | POA: Diagnosis not present

## 2020-06-22 DIAGNOSIS — Z79899 Other long term (current) drug therapy: Secondary | ICD-10-CM | POA: Diagnosis not present

## 2020-06-22 DIAGNOSIS — Z91030 Bee allergy status: Secondary | ICD-10-CM | POA: Diagnosis not present

## 2020-06-22 DIAGNOSIS — I129 Hypertensive chronic kidney disease with stage 1 through stage 4 chronic kidney disease, or unspecified chronic kidney disease: Secondary | ICD-10-CM | POA: Diagnosis present

## 2020-06-22 DIAGNOSIS — E860 Dehydration: Secondary | ICD-10-CM | POA: Diagnosis present

## 2020-06-22 DIAGNOSIS — R112 Nausea with vomiting, unspecified: Secondary | ICD-10-CM | POA: Diagnosis present

## 2020-06-22 DIAGNOSIS — R634 Abnormal weight loss: Secondary | ICD-10-CM | POA: Diagnosis present

## 2020-06-22 DIAGNOSIS — K5720 Diverticulitis of large intestine with perforation and abscess without bleeding: Secondary | ICD-10-CM | POA: Diagnosis present

## 2020-06-22 DIAGNOSIS — Z20822 Contact with and (suspected) exposure to covid-19: Secondary | ICD-10-CM | POA: Diagnosis present

## 2020-06-22 DIAGNOSIS — D649 Anemia, unspecified: Secondary | ICD-10-CM | POA: Diagnosis present

## 2020-06-22 DIAGNOSIS — J449 Chronic obstructive pulmonary disease, unspecified: Secondary | ICD-10-CM | POA: Diagnosis present

## 2020-06-22 DIAGNOSIS — Z955 Presence of coronary angioplasty implant and graft: Secondary | ICD-10-CM | POA: Diagnosis not present

## 2020-06-22 DIAGNOSIS — K219 Gastro-esophageal reflux disease without esophagitis: Secondary | ICD-10-CM | POA: Diagnosis present

## 2020-06-22 DIAGNOSIS — Z7902 Long term (current) use of antithrombotics/antiplatelets: Secondary | ICD-10-CM | POA: Diagnosis not present

## 2020-06-22 DIAGNOSIS — Z888 Allergy status to other drugs, medicaments and biological substances status: Secondary | ICD-10-CM | POA: Diagnosis not present

## 2020-06-22 DIAGNOSIS — Z8673 Personal history of transient ischemic attack (TIA), and cerebral infarction without residual deficits: Secondary | ICD-10-CM | POA: Diagnosis not present

## 2020-06-22 DIAGNOSIS — N1832 Chronic kidney disease, stage 3b: Secondary | ICD-10-CM | POA: Diagnosis present

## 2020-06-22 DIAGNOSIS — N179 Acute kidney failure, unspecified: Secondary | ICD-10-CM | POA: Diagnosis present

## 2020-06-22 DIAGNOSIS — I2510 Atherosclerotic heart disease of native coronary artery without angina pectoris: Secondary | ICD-10-CM | POA: Diagnosis present

## 2020-06-22 DIAGNOSIS — K222 Esophageal obstruction: Secondary | ICD-10-CM | POA: Diagnosis present

## 2020-06-22 DIAGNOSIS — Z6825 Body mass index (BMI) 25.0-25.9, adult: Secondary | ICD-10-CM | POA: Diagnosis not present

## 2020-06-22 DIAGNOSIS — I252 Old myocardial infarction: Secondary | ICD-10-CM | POA: Diagnosis not present

## 2020-06-22 LAB — CBC WITH DIFFERENTIAL/PLATELET
Abs Immature Granulocytes: 0.08 10*3/uL — ABNORMAL HIGH (ref 0.00–0.07)
Basophils Absolute: 0.1 10*3/uL (ref 0.0–0.1)
Basophils Relative: 0 %
Eosinophils Absolute: 0.2 10*3/uL (ref 0.0–0.5)
Eosinophils Relative: 2 %
HCT: 30.7 % — ABNORMAL LOW (ref 39.0–52.0)
Hemoglobin: 10.1 g/dL — ABNORMAL LOW (ref 13.0–17.0)
Immature Granulocytes: 1 %
Lymphocytes Relative: 11 %
Lymphs Abs: 1.4 10*3/uL (ref 0.7–4.0)
MCH: 30.4 pg (ref 26.0–34.0)
MCHC: 32.9 g/dL (ref 30.0–36.0)
MCV: 92.5 fL (ref 80.0–100.0)
Monocytes Absolute: 1.1 10*3/uL — ABNORMAL HIGH (ref 0.1–1.0)
Monocytes Relative: 9 %
Neutro Abs: 9.6 10*3/uL — ABNORMAL HIGH (ref 1.7–7.7)
Neutrophils Relative %: 77 %
Platelets: 384 10*3/uL (ref 150–400)
RBC: 3.32 MIL/uL — ABNORMAL LOW (ref 4.22–5.81)
RDW: 13.2 % (ref 11.5–15.5)
WBC: 12.4 10*3/uL — ABNORMAL HIGH (ref 4.0–10.5)
nRBC: 0 % (ref 0.0–0.2)

## 2020-06-22 LAB — COMPREHENSIVE METABOLIC PANEL
ALT: 20 U/L (ref 0–44)
AST: 29 U/L (ref 15–41)
Albumin: 1.9 g/dL — ABNORMAL LOW (ref 3.5–5.0)
Alkaline Phosphatase: 73 U/L (ref 38–126)
Anion gap: 9 (ref 5–15)
BUN: 8 mg/dL (ref 8–23)
CO2: 21 mmol/L — ABNORMAL LOW (ref 22–32)
Calcium: 8.5 mg/dL — ABNORMAL LOW (ref 8.9–10.3)
Chloride: 107 mmol/L (ref 98–111)
Creatinine, Ser: 1.81 mg/dL — ABNORMAL HIGH (ref 0.61–1.24)
GFR, Estimated: 37 mL/min — ABNORMAL LOW (ref >60)
Glucose, Bld: 115 mg/dL — ABNORMAL HIGH (ref 70–99)
Potassium: 3.8 mmol/L (ref 3.5–5.1)
Sodium: 137 mmol/L (ref 135–145)
Total Bilirubin: 0.8 mg/dL (ref 0.3–1.2)
Total Protein: 5.8 g/dL — ABNORMAL LOW (ref 6.5–8.1)

## 2020-06-22 MED ORDER — PANTOPRAZOLE SODIUM 40 MG PO TBEC
40.0000 mg | DELAYED_RELEASE_TABLET | Freq: Two times a day (BID) | ORAL | Status: DC
  Administered 2020-06-22 – 2020-06-23 (×3): 40 mg via ORAL
  Filled 2020-06-22 (×3): qty 1

## 2020-06-22 MED ORDER — CLOPIDOGREL BISULFATE 75 MG PO TABS
75.0000 mg | ORAL_TABLET | Freq: Every day | ORAL | Status: DC
  Administered 2020-06-22 – 2020-06-23 (×2): 75 mg via ORAL
  Filled 2020-06-22 (×2): qty 1

## 2020-06-22 MED ORDER — LIDOCAINE 5 % EX PTCH
1.0000 | MEDICATED_PATCH | CUTANEOUS | Status: DC
  Administered 2020-06-22: 1 via TRANSDERMAL
  Filled 2020-06-22: qty 1

## 2020-06-22 NOTE — Plan of Care (Signed)
Cardiovascular complication will bed avoided:Will continue to monitor vs and give medication as indicated.

## 2020-06-22 NOTE — Progress Notes (Signed)
Pt states he was awakened by severe pain to (L) side. Pt states that the hernia has been there for a while, however, he as never had pain to the area before. States the area is painful to touch. Pt was given prn medication and on call provider notified. Will follow up and tx as indicated.

## 2020-06-22 NOTE — Progress Notes (Signed)
Subjective:  O/N events: woke up with L sided pain  [name] was seen and evaluated at bedside this AM. He states that he is doing "terribly" and believes he broke a rib last night. He states that he had a severe coughing fit He was able to sleep through the night but woke up with L sided pain. He states that he received Norco which has provided little benefit.   Objective:  Vital signs in last 24 hours: Vitals:   06/21/20 2215 06/22/20 0540 06/22/20 0758 06/22/20 0946  BP: (!) 163/74 (!) 170/85  (!) 143/69  Pulse: 87 90  96  Resp: 18 20  19   Temp:  97.9 F (36.6 C)  98 F (36.7 C)  TempSrc:  Oral  Oral  SpO2:  95% 92% 94%  Weight:      Height:       Physical Exam Constitutional:      General: He is not in acute distress.    Appearance: Normal appearance. He is not ill-appearing or toxic-appearing.  Cardiovascular:     Rate and Rhythm: Normal rate and regular rhythm.     Pulses: Normal pulses.     Heart sounds: Normal heart sounds. No murmur heard. No friction rub. No gallop.   Pulmonary:     Effort: Pulmonary effort is normal.     Breath sounds: Normal breath sounds.  Abdominal:     General: Abdomen is flat. Bowel sounds are normal.     Palpations: Abdomen is soft.     Tenderness: There is no abdominal tenderness. There is no guarding.  Musculoskeletal:        General: Tenderness present. No swelling, deformity or signs of injury.     Right lower leg: No edema.     Left lower leg: No edema.     Comments: Tenderness on the L lateral chest wall to palpation. No obvious malformations.   Skin:    Coloration: Skin is not jaundiced.     Findings: No bruising or erythema.  Neurological:     Mental Status: He is alert and oriented to person, place, and time.     CBC Latest Ref Rng & Units 06/22/2020 06/21/2020 06/20/2020  WBC 4.0 - 10.5 K/uL 12.4(H) 12.4(H) 14.8(H)  Hemoglobin 13.0 - 17.0 g/dL 10.1(L) 10.0(L) 10.7(L)  Hematocrit 39.0 - 52.0 % 30.7(L) 31.1(L) 33.8(L)   Platelets 150 - 400 K/uL 384 393 421(H)   BMP Latest Ref Rng & Units 06/22/2020 06/21/2020 06/20/2020  Glucose 70 - 99 mg/dL 115(H) 128(H) 145(H)  BUN 8 - 23 mg/dL 8 9 10   Creatinine 0.61 - 1.24 mg/dL 1.81(H) 1.97(H) 2.10(H)  Sodium 135 - 145 mmol/L 137 135 137  Potassium 3.5 - 5.1 mmol/L 3.8 3.1(L) 3.4(L)  Chloride 98 - 111 mmol/L 107 107 104  CO2 22 - 32 mmol/L 21(L) 22 24  Calcium 8.9 - 10.3 mg/dL 8.5(L) 8.2(L) 8.5(L)     Assessment/Plan:  Active Problems:   Abnormal CT of the abdomen   Refractory nausea and vomiting   Gastric lesion   Esophageal stricture  Refractory Nausea and Vomiting Patient doing well this AM. Is willing to attempt his diet this AM. No vomiting or retching ON. He has not tried his diet yet, will order this AM and see if he can tolerate liquids.  - Liquid diet today, observe to see if patient tolerates diet  - FOBT waiting collection -Continue ondansetron 4 mg IV every 6 hours prn for nausea -Trend CBC  Left Lateral Chest Wall Pain:  Patient with coughing fit last night, awoke this morning with lateral rib pain. Patient states that he may have broken his rib. He states that pain is "stabbing" and is aggravated by deep breaths in. Patient received Norco for his pain which has provided little relief. On physical exam, there is no bruising, or malformation of the chest wall, mild tenderness on palpation, likely MSK in nature. Patient already on baclofen, due to kidney function will hold off on NSAIDS.  - Lidocaine 5% patch    Hypertension - Clonidine 0.1 mg daily - Norvasc 10mg  daily  - Enalapril 20mg  in the morning and 10mg  at night  COPD -Home albuterol -Home Dulera  Stage 3b CKD Patient's kidney function continues to improve.  -Trend CMP  GERD -Protonix 4 mg IV twice daily   History of coronary stenting and CVA Patient underwent EGD yesterday with no biopsy. He has been off Plavix for 4 days due to his nausea and vomiting. Will restart  today. - Plavix 75 mg daily   Bilateral indeterminate renal lesions Patient's MRI demonstrates bilateral lesions in the kidney; the left mass has grown in size since 2017. Patient continues to be stable without flank pain or dysuria. -Will follow up outpatient for further evaluation  Barriers to Discharge: Continued medical workup Dispo: Anticipated discharge in approximately 1-2 day(s).   Maudie Mercury, MD 06/22/2020, 10:04 AM Pager: [phone] After 5pm on weekdays and 1pm on weekends: On Call pager [phone]

## 2020-06-23 ENCOUNTER — Inpatient Hospital Stay (HOSPITAL_COMMUNITY): Payer: No Typology Code available for payment source

## 2020-06-23 DIAGNOSIS — R112 Nausea with vomiting, unspecified: Principal | ICD-10-CM

## 2020-06-23 DIAGNOSIS — N179 Acute kidney failure, unspecified: Secondary | ICD-10-CM

## 2020-06-23 DIAGNOSIS — R0789 Other chest pain: Secondary | ICD-10-CM

## 2020-06-23 LAB — BASIC METABOLIC PANEL
Anion gap: 8 (ref 5–15)
BUN: 9 mg/dL (ref 8–23)
CO2: 23 mmol/L (ref 22–32)
Calcium: 8.7 mg/dL — ABNORMAL LOW (ref 8.9–10.3)
Chloride: 106 mmol/L (ref 98–111)
Creatinine, Ser: 1.96 mg/dL — ABNORMAL HIGH (ref 0.61–1.24)
GFR, Estimated: 33 mL/min — ABNORMAL LOW (ref >60)
Glucose, Bld: 108 mg/dL — ABNORMAL HIGH (ref 70–99)
Potassium: 3.9 mmol/L (ref 3.5–5.1)
Sodium: 137 mmol/L (ref 135–145)

## 2020-06-23 LAB — CBC
HCT: 31.7 % — ABNORMAL LOW (ref 39.0–52.0)
Hemoglobin: 10.2 g/dL — ABNORMAL LOW (ref 13.0–17.0)
MCH: 30.3 pg (ref 26.0–34.0)
MCHC: 32.2 g/dL (ref 30.0–36.0)
MCV: 94.1 fL (ref 80.0–100.0)
Platelets: 421 10*3/uL — ABNORMAL HIGH (ref 150–400)
RBC: 3.37 MIL/uL — ABNORMAL LOW (ref 4.22–5.81)
RDW: 13.4 % (ref 11.5–15.5)
WBC: 11.3 10*3/uL — ABNORMAL HIGH (ref 4.0–10.5)
nRBC: 0 % (ref 0.0–0.2)

## 2020-06-23 MED ORDER — CLONIDINE HCL 0.1 MG PO TABS: 0.1000 mg | ORAL_TABLET | Freq: Every day | ORAL | 0 refills | Status: DC

## 2020-06-23 MED ORDER — DICLOFENAC SODIUM 1 % EX GEL: 2.0000 g | Freq: Four times a day (QID) | CUTANEOUS | 0 refills | Status: DC

## 2020-06-23 MED ORDER — DICLOFENAC SODIUM 1 % EX GEL
2.0000 g | Freq: Four times a day (QID) | CUTANEOUS | Status: DC
  Administered 2020-06-23 (×4): 2 g via TOPICAL
  Filled 2020-06-23: qty 100

## 2020-06-23 MED ORDER — PANTOPRAZOLE SODIUM 40 MG PO TBEC: 40.0000 mg | DELAYED_RELEASE_TABLET | Freq: Two times a day (BID) | ORAL | 0 refills | Status: DC

## 2020-06-23 MED ORDER — ONDANSETRON HCL 4 MG PO TABS: 4.0000 mg | ORAL_TABLET | Freq: Four times a day (QID) | ORAL | 0 refills | Status: DC | PRN

## 2020-06-23 MED ORDER — LEVOTHYROXINE SODIUM 75 MCG PO TABS: 75.0000 ug | ORAL_TABLET | Freq: Every day | ORAL | 0 refills | Status: DC

## 2020-06-23 NOTE — Discharge Summary (Addendum)
Name: Wayne Meadows MRN: [id number] DOB: [birth year] 86 y.o. PCP: System, Provider Not In  Date of Admission: 06/20/2020 10:30 AM Date of Discharge: 06/23/2020 Attending Physician: No att. providers found  Discharge Diagnosis: 1. Refractory nausea and vomiting 2. Left lateral chest wall pain 3. Bilateral indeterminate renal lesions  Discharge Medications: Allergies as of 06/23/2020      Reactions   Bee Venom Anaphylaxis   Has epi pen   Influenza Vaccines Other (See Comments)   "Mortally sick for 2 weeks"      Medication List    STOP taking these medications   furosemide 40 MG tablet Commonly known as: LASIX   MESALAMINE PO     TAKE these medications   albuterol 108 (90 Base) MCG/ACT inhaler Commonly known as: VENTOLIN HFA Inhale 1 puff into the lungs every 6 (six) hours as needed for shortness of breath.   amLODipine 5 MG tablet Commonly known as: NORVASC Take 5 mg by mouth daily.   aspirin EC 81 MG tablet Take 81 mg by mouth daily.   baclofen 10 MG tablet Commonly known as: LIORESAL Take 10 mg by mouth at bedtime.   cloNIDine 0.1 MG tablet Commonly known as: CATAPRES Take 1 tablet (0.1 mg total) by mouth daily.   clopidogrel 75 MG tablet Commonly known as: PLAVIX Take 75 mg by mouth daily.   diclofenac Sodium 1 % Gel Commonly known as: VOLTAREN Apply 2 g topically 4 (four) times daily.   enalapril 20 MG tablet Commonly known as: VASOTEC Take 20 mg by mouth at bedtime.   EPINEPHrine 0.3 mg/0.3 mL Devi Commonly known as: EPI-PEN Inject 0.3 mg into the muscle once as needed (for anaphylactic reaction).   gabapentin 300 MG capsule Commonly known as: NEURONTIN Take 300-600 mg by mouth See admin instructions. Take 300 mg by mouth in the morning then 300 mg at noon then 600 mg at bedtime   HYDROcodone-acetaminophen 10-325 MG tablet Commonly known as: NORCO Take 1 tablet by mouth 2 (two) times daily.   Ipratropium-Albuterol 20-100 MCG/ACT Aers  respimat Commonly known as: COMBIVENT Inhale 1 puff into the lungs 4 (four) times daily.   levothyroxine 75 MCG tablet Commonly known as: SYNTHROID Take 1 tablet (75 mcg total) by mouth daily at 6 (six) AM.   melatonin 3 MG Tabs tablet Take 6 mg by mouth at bedtime.   metoprolol tartrate 25 MG tablet Commonly known as: LOPRESSOR Take 25 mg by mouth daily.   mometasone 220 MCG/INH inhaler Commonly known as: ASMANEX Inhale 1 puff into the lungs at bedtime.   multivitamin tablet Take 1 tablet by mouth daily.   nitroGLYCERIN 0.4 MG SL tablet Commonly known as: NITROSTAT Place 0.4 mg under the tongue every 5 (five) minutes as needed for chest pain.   ondansetron 4 MG tablet Commonly known as: ZOFRAN Take 1 tablet (4 mg total) by mouth every 6 (six) hours as needed for nausea.   pantoprazole 40 MG tablet Commonly known as: PROTONIX Take 1 tablet (40 mg total) by mouth 2 (two) times daily. What changed:   medication strength  how much to take  when to take this   polyethylene glycol 17 g packet Commonly known as: MIRALAX / GLYCOLAX Take 17 g by mouth daily. What changed:   when to take this  reasons to take this   pravastatin 40 MG tablet Commonly known as: PRAVACHOL Take 40 mg by mouth at bedtime.      Disposition and follow-up:  Mr.Dwyne Drema Dallas was discharged from [facility] in Good condition. At the hospital follow up visit please address:  1.  Follow up: -Refractory nausea and vomiting: Ensure OP GI gastric [place] was obtained and any recommendations addressed, assess symptom resolution/recurrence -Left lateral chest wall pain: Assess efficacy of diclofenac gel and resolution/persistence of symptoms -Bilateral indeterminate renal lesions: Assess any urinary complaints, consider nephrology referral for further management  2.  Labs / imaging needed at time of follow-up: BMP for stage 3b CKD  3.  Pending labs/ test needing follow-up:  FOBT  Follow-up Appointments: request sent, will follow up with patient  Hospital Course by problem list: 1. Refractory nausea and vomiting: Patient came in with 3 days of persistent nausea and vomiting after eating some potato salad. He vomited 2-3 times and stated the vomit was clear with no food or blood. One day before presenting to the ED, he had stopped vomiting but still endorsed nausea and dry-heaving that brought on 5/10 pain in the epigastrium. He denied ever having symptoms like these before, traveling outside adjacent counties, fever, diarrhea, constipation, chest pain, SOB, dizziness, or palpitations. He did endorse some chilling and a 4 month history of decreased appetite and 20 pound weight loss. He also stated he had a history of melanoma and renal cell carcinoma.  Throughout admission, he did not have any more nausea, vomiting, or dry heaving. He did not have any dysphagia or odynophagia. He became febrile one day into admission, but his WBC continued to decline. The fever resolved spontaneously. He was mildly anemic, likely due to fluid administration. EGD after GI consult was essentially normal with recommendations for OP gastric [place]. Patient was scheduled to be discharged, but he did not have an appetite to eat. Discharge continued upon toleration of liquid diet this morning.  2. Left lateral chest wall pain Patient had a coughing spell two days ago that caused him to have a stabbing, positional, pleuritic chest pain. Norco was not helpful. Lidocaine 5% patch was given with no substantial relief. Ordered diclofenac 1% topical gel 2 g Q6H prn for pain.  3. Bilateral indeterminate renal lesions Patient found to have two indeterminate renal lesions found incidentally on CT A/P. Left renal lesion enlarged from CT in 2017. Planned to follow up outpatient for further management.  Discharge Exam:   BP (!) 143/66 (BP Location: Right Arm)   Pulse 85   Temp 99.1 F (37.3 C) (Oral)    Resp 18   Ht 6\' 2"  (1.88 m)   Wt 89.5 kg   SpO2 94%   BMI 25.33 kg/m   Physical Exam Constitutional:      Appearance: Normal appearance.  HENT:     Head: Normocephalic and atraumatic.  Eyes:     Extraocular Movements: Extraocular movements intact.  Cardiovascular:     Rate and Rhythm: Normal rate and regular rhythm.     Pulses: Normal pulses.     Heart sounds: Normal heart sounds.  Pulmonary:     Effort: Pulmonary effort is normal.     Breath sounds: Normal breath sounds.  Abdominal:     General: Bowel sounds are normal.     Palpations: Abdomen is soft.  Musculoskeletal:        General: Normal range of motion.     Cervical back: Normal range of motion and neck supple.     Comments: TTP of the left lateral chest wall  Skin:    General: Skin is warm and dry.  Neurological:     General: No focal deficit present.     Mental Status: He is alert and oriented to person, place, and time. Mental status is at baseline.  Psychiatric:        Mood and Affect: Mood normal.        Behavior: Behavior normal.        Thought Content: Thought content normal.    Pertinent Labs, Studies, and Procedures:   Recent Results (from the past 2160 hour(s))  Comprehensive metabolic panel     Status: Abnormal   Collection Time: 06/20/20 10:39 AM  Result Value Ref Range   Sodium 137 135 - 145 mmol/L   Potassium 3.4 (L) 3.5 - 5.1 mmol/L   Chloride 104 98 - 111 mmol/L   CO2 24 22 - 32 mmol/L   Glucose, Bld 145 (H) 70 - 99 mg/dL    Comment: Glucose reference range applies only to samples taken after fasting for at least 8 hours.   BUN 10 8 - 23 mg/dL   Creatinine, Ser 2.10 (H) 0.61 - 1.24 mg/dL   Calcium 8.5 (L) 8.9 - 10.3 mg/dL   Total Protein 6.8 6.5 - 8.1 g/dL   Albumin 2.1 (L) 3.5 - 5.0 g/dL   AST 23 15 - 41 U/L   ALT 14 0 - 44 U/L   Alkaline Phosphatase 82 38 - 126 U/L   Total Bilirubin 0.8 0.3 - 1.2 mg/dL   GFR, Estimated 31 (L) >60 mL/min    Comment: (NOTE) Calculated using the  CKD-EPI Creatinine Equation (2021)    Anion gap 9 5 - 15    Comment: Performed at [facility], [place] [address], [place], [place] [address]  Lipase, blood     Status: None   Collection Time: 06/20/20 10:39 AM  Result Value Ref Range   Lipase 51 11 - 51 U/L    Comment: Performed at [facility] [address], [place], [place] [address]  CBC WITH DIFFERENTIAL     Status: Abnormal   Collection Time: 06/20/20 10:39 AM  Result Value Ref Range   WBC 14.8 (H) 4.0 - 10.5 K/uL   RBC 3.61 (L) 4.22 - 5.81 MIL/uL   Hemoglobin 10.7 (L) 13.0 - 17.0 g/dL   HCT 33.8 (L) 39.0 - 52.0 %   MCV 93.6 80.0 - 100.0 fL   MCH 29.6 26.0 - 34.0 pg   MCHC 31.7 30.0 - 36.0 g/dL   RDW 13.2 11.5 - 15.5 %   Platelets 421 (H) 150 - 400 K/uL   nRBC 0.0 0.0 - 0.2 %   Neutrophils Relative % 84 %   Neutro Abs 12.5 (H) 1.7 - 7.7 K/uL   Lymphocytes Relative 7 %   Lymphs Abs 1.0 0.7 - 4.0 K/uL   Monocytes Relative 8 %   Monocytes Absolute 1.1 (H) 0.1 - 1.0 K/uL   Eosinophils Relative 0 %   Eosinophils Absolute 0.0 0.0 - 0.5 K/uL   Basophils Relative 0 %   Basophils Absolute 0.0 0.0 - 0.1 K/uL   Immature Granulocytes 1 %   Abs Immature Granulocytes 0.09 (H) 0.00 - 0.07 K/uL    Comment: Performed at [facility], [address] [place], [place] [address]  SARS CORONAVIRUS 2 (TAT 6-24 HRS) Nasopharyngeal Nasopharyngeal Swab     Status: None   Collection Time: 06/20/20  3:58 PM   Specimen: Nasopharyngeal Swab  Result Value Ref Range   SARS Coronavirus 2 NEGATIVE NEGATIVE  Comment: (NOTE) SARS-CoV-2 target nucleic acids are NOT DETECTED.  The SARS-CoV-2 RNA is generally detectable in upper and lower respiratory specimens during the acute phase of infection. Negative results do not preclude SARS-CoV-2 infection, do not rule out co-infections with other pathogens, and should not be used as the sole basis for treatment or other patient management decisions. Negative results must be combined with  clinical observations, patient history, and epidemiological information. The expected result is Negative.  Fact Sheet for Patients: SugarRoll.be  Fact Sheet for Healthcare Providers: https://www.woods-mathews.com/  This test is not yet approved or cleared by the [place] FDA and  has been authorized for detection and/or diagnosis of SARS-CoV-2 by FDA under an Emergency Use Authorization (EUA). This EUA will remain  in effect (meaning this test can be used) for the duration of the COVID-19 declaration under Se ction 564(b)(1) of the Act, 21 U.S.C. section 360bbb-3(b)(1), unless the authorization is terminated or revoked sooner.  Performed at [facility], [place] [address], [place], [place] [address]   Urinalysis, Routine w reflex microscopic     Status: Abnormal   Collection Time: 06/20/20 10:36 PM  Result Value Ref Range   Color, Urine YELLOW YELLOW   APPearance CLEAR CLEAR   Specific Gravity, Urine 1.013 1.005 - 1.030   pH 7.0 5.0 - 8.0   Glucose, UA 50 (A) NEGATIVE mg/dL   Hgb urine dipstick SMALL (A) NEGATIVE   Bilirubin Urine NEGATIVE NEGATIVE   Ketones, ur 5 (A) NEGATIVE mg/dL   Protein, ur >=300 (A) NEGATIVE mg/dL   Nitrite NEGATIVE NEGATIVE   Leukocytes,Ua NEGATIVE NEGATIVE   RBC / HPF 0-5 0 - 5 RBC/hpf   WBC, UA 0-5 0 - 5 WBC/hpf   Bacteria, UA NONE SEEN NONE SEEN   Squamous Epithelial / LPF 0-5 0 - 5    Comment: Performed at [facility], [place] [address] [place], [place] [address]  Comprehensive metabolic panel     Status: Abnormal   Collection Time: 06/21/20 12:42 AM  Result Value Ref Range   Sodium 135 135 - 145 mmol/L   Potassium 3.1 (L) 3.5 - 5.1 mmol/L   Chloride 107 98 - 111 mmol/L   CO2 22 22 - 32 mmol/L   Glucose, Bld 128 (H) 70 - 99 mg/dL    Comment: Glucose reference range applies only to samples taken after fasting for at least 8 hours.   BUN 9 8 - 23 mg/dL   Creatinine, Ser 1.97 (H) 0.61 -  1.24 mg/dL   Calcium 8.2 (L) 8.9 - 10.3 mg/dL   Total Protein 5.8 (L) 6.5 - 8.1 g/dL   Albumin 1.9 (L) 3.5 - 5.0 g/dL   AST 32 15 - 41 U/L   ALT 18 0 - 44 U/L   Alkaline Phosphatase 74 38 - 126 U/L   Total Bilirubin 0.8 0.3 - 1.2 mg/dL   GFR, Estimated 33 (L) >60 mL/min    Comment: (NOTE) Calculated using the CKD-EPI Creatinine Equation (2021)    Anion gap 6 5 - 15    Comment: Performed at [facility], [place] [address], [place], [place] [address]  CBC with Differential/Platelet     Status: Abnormal   Collection Time: 06/21/20 12:42 AM  Result Value Ref Range   WBC 12.4 (H) 4.0 - 10.5 K/uL   RBC 3.33 (L) 4.22 - 5.81 MIL/uL   Hemoglobin 10.0 (L) 13.0 - 17.0 g/dL   HCT 31.1 (L) 39.0 - 52.0 %  MCV 93.4 80.0 - 100.0 fL   MCH 30.0 26.0 - 34.0 pg   MCHC 32.2 30.0 - 36.0 g/dL   RDW 13.2 11.5 - 15.5 %   Platelets 393 150 - 400 K/uL   nRBC 0.0 0.0 - 0.2 %   Neutrophils Relative % 81 %   Neutro Abs 9.9 (H) 1.7 - 7.7 K/uL   Lymphocytes Relative 10 %   Lymphs Abs 1.3 0.7 - 4.0 K/uL   Monocytes Relative 8 %   Monocytes Absolute 1.0 0.1 - 1.0 K/uL   Eosinophils Relative 0 %   Eosinophils Absolute 0.0 0.0 - 0.5 K/uL   Basophils Relative 0 %   Basophils Absolute 0.0 0.0 - 0.1 K/uL   Immature Granulocytes 1 %   Abs Immature Granulocytes 0.07 0.00 - 0.07 K/uL    Comment: Performed at [facility] [address], [place], [place] [address]  CBC with Differential/Platelet     Status: Abnormal   Collection Time: 06/22/20  1:45 AM  Result Value Ref Range   WBC 12.4 (H) 4.0 - 10.5 K/uL   RBC 3.32 (L) 4.22 - 5.81 MIL/uL   Hemoglobin 10.1 (L) 13.0 - 17.0 g/dL   HCT 30.7 (L) 39.0 - 52.0 %   MCV 92.5 80.0 - 100.0 fL   MCH 30.4 26.0 - 34.0 pg   MCHC 32.9 30.0 - 36.0 g/dL   RDW 13.2 11.5 - 15.5 %   Platelets 384 150 - 400 K/uL   nRBC 0.0 0.0 - 0.2 %   Neutrophils Relative % 77 %   Neutro Abs 9.6 (H) 1.7 - 7.7 K/uL   Lymphocytes Relative 11 %   Lymphs Abs 1.4 0.7 - 4.0 K/uL    Monocytes Relative 9 %   Monocytes Absolute 1.1 (H) 0.1 - 1.0 K/uL   Eosinophils Relative 2 %   Eosinophils Absolute 0.2 0.0 - 0.5 K/uL   Basophils Relative 0 %   Basophils Absolute 0.1 0.0 - 0.1 K/uL   Immature Granulocytes 1 %   Abs Immature Granulocytes 0.08 (H) 0.00 - 0.07 K/uL    Comment: Performed at [facility] [address] [place], [place] [address]  Comprehensive metabolic panel     Status: Abnormal   Collection Time: 06/22/20  1:45 AM  Result Value Ref Range   Sodium 137 135 - 145 mmol/L   Potassium 3.8 3.5 - 5.1 mmol/L   Chloride 107 98 - 111 mmol/L   CO2 21 (L) 22 - 32 mmol/L   Glucose, Bld 115 (H) 70 - 99 mg/dL    Comment: Glucose reference range applies only to samples taken after fasting for at least 8 hours.   BUN 8 8 - 23 mg/dL   Creatinine, Ser 1.81 (H) 0.61 - 1.24 mg/dL   Calcium 8.5 (L) 8.9 - 10.3 mg/dL   Total Protein 5.8 (L) 6.5 - 8.1 g/dL   Albumin 1.9 (L) 3.5 - 5.0 g/dL   AST 29 15 - 41 U/L   ALT 20 0 - 44 U/L   Alkaline Phosphatase 73 38 - 126 U/L   Total Bilirubin 0.8 0.3 - 1.2 mg/dL   GFR, Estimated 37 (L) >60 mL/min    Comment: (NOTE) Calculated using the CKD-EPI Creatinine Equation (2021)    Anion gap 9 5 - 15    Comment: Performed at [facility], [place] [address] [place], [place] [address]  CBC     Status: Abnormal   Collection Time: 06/23/20  3:06 AM  Result Value Ref Range   WBC 11.3 (H) 4.0 - 10.5 K/uL   RBC 3.37 (L) 4.22 - 5.81 MIL/uL   Hemoglobin 10.2 (L) 13.0 - 17.0 g/dL   HCT 31.7 (L) 39.0 - 52.0 %   MCV 94.1 80.0 - 100.0 fL   MCH 30.3 26.0 - 34.0 pg   MCHC 32.2 30.0 - 36.0 g/dL   RDW 13.4 11.5 - 15.5 %   Platelets 421 (H) 150 - 400 K/uL   nRBC 0.0 0.0 - 0.2 %    Comment: Performed at [facility] [address] [place], [place] [address]  Basic metabolic panel     Status: Abnormal   Collection Time: 06/23/20  3:06 AM  Result Value Ref Range   Sodium 137 135 - 145 mmol/L   Potassium 3.9 3.5 - 5.1 mmol/L    Chloride 106 98 - 111 mmol/L   CO2 23 22 - 32 mmol/L   Glucose, Bld 108 (H) 70 - 99 mg/dL    Comment: Glucose reference range applies only to samples taken after fasting for at least 8 hours.   BUN 9 8 - 23 mg/dL   Creatinine, Ser 1.96 (H) 0.61 - 1.24 mg/dL   Calcium 8.7 (L) 8.9 - 10.3 mg/dL   GFR, Estimated 33 (L) >60 mL/min    Comment: (NOTE) Calculated using the CKD-EPI Creatinine Equation (2021)    Anion gap 8 5 - 15    Comment: Performed at [facility] [address], [place], [place] [address]   CT A/P WO CONTRAST IMPRESSION: 1. Irregular gastric wall thickening about the cardia and greater curvature, greater than 3.5 cm. Differential considerations include neoplasm, peptic ulcer disease or less likely focal gastritis. There is significant adjacent inflammation in the left upper quadrant, but no free air or frank perforation. This inflammation abuts the tail the pancreas, however appears to be related to stomach rather than pancreatic. Endoscopy recommended for further evaluation. 2. Small amount of free fluid or soft tissue density in the left pericolic gutter, possibly reactive but indeterminate. 3. Mildly dilated small bowel in the lower abdomen without discrete transition point, suspicious for ileus. 4. Extensive colonic diverticulosis without diverticulitis. 5. Bilateral renal cysts. There also 2 indeterminate lesions, 1 in the right and 1 in the left kidney. Left renal lesion has slightly increased in size from 2017, currently 16 mm. Recommend MRI characterization after resolution of acute event on an elective outpatient basis. 6. Aortic Atherosclerosis (ICD10-I70.0).  Discharge Instructions: Discharge Instructions    Call MD for:  difficulty breathing, headache or visual disturbances   Complete by: As directed    Call MD for:  extreme fatigue   Complete by: As directed    Call MD for:  hives   Complete by: As directed    Call MD for:  persistant  dizziness or light-headedness   Complete by: As directed    Call MD for:  persistant nausea and vomiting   Complete by: As directed    Call MD for:  redness, tenderness, or signs of infection (pain, swelling, redness, odor or green/yellow discharge around incision site)   Complete by: As directed    Call MD for:  severe uncontrolled pain   Complete by: As directed    Call MD for:  temperature >100.4   Complete by: As directed    Diet - low sodium heart healthy   Complete by: As directed    Increase activity slowly  Complete by: As directed       [name], it was a pleasure taking care of you. You were hospitalized for nausea and vomiting that was likely caused by thickening of your stomach wall. Your symptoms improved with the nausea medication Zofran and fluids. Here are your discharge instructions: 1. Follow up with the gastroenterologist to get an ultrasound of your stomach wall for further evaluation since the EGD we did here did not offer [place] an explanation of your symptoms. 2. We have sent in a prescription for the diclofenac gel that you can place on your left side for pain. If this pain continues, please follow up with [place] sooner. 3. At your follow up visit, we will discuss the places on your kidneys that were found on the CT scan. 4. Please continue all of your home medications as prescribed. 5. We will contact you soon to schedule your follow up visit with [place].  Signed: Mikal Plane, Medical Student 06/24/2020, 12:11 PM   Pager: [phone] After 5pm on weekdays and 1pm on weekends: On Call pager [phone]

## 2020-06-23 NOTE — Discharge Instructions (Signed)
[name], it was a pleasure taking care of you. You were hospitalized for nausea and vomiting that was likely caused by thickening of your stomach wall. Your symptoms improved with the nausea medication Zofran and fluids.  Here are your discharge instructions: 1. Follow up with the gastroenterologist to get an ultrasound of your stomach wall for further evaluation since the EGD we did here did not offer [place] an explanation of your symptoms. 2. We have sent in a prescription for the diclofenac gel that you can place on your left side for pain. If this pain continues, please follow up with [place] sooner. 3. At your follow up visit, we will discuss the places on your kidneys that were found on the CT scan. 4. Please continue all of your home medications as prescribed. 5. We will contact you soon to schedule your follow up visit with [place].  Nausea and Vomiting, Adult Nausea is feeling sick to your stomach or feeling that you are about to throw up (vomit). Vomiting is when food in your stomach is thrown up and out of the mouth. Throwing up can make you feel weak. It can also make you lose too much water in your body (get dehydrated). If you lose too much water in your body, you may:  Feel tired.  Feel thirsty.  Have a dry mouth.  Have cracked lips.  Go pee (urinate) less often. Older adults and people with other diseases or a weak body defense system (immune system) are at higher risk for losing too much water in the body. If you feel sick to your stomach and you throw up, it is important to follow instructions from your doctor about how to take care of yourself. Follow these instructions at home: Watch your symptoms for any changes. Tell your doctor about them. Follow these instructions to care for yourself at home. Eating and drinking  Take an ORS (oral rehydration solution). This is a drink that is sold at pharmacies and stores.  Drink clear fluids in small amounts as you are able, such  as: ? Water. ? Ice chips. ? Fruit juice that has water added (diluted fruit juice). ? Low-calorie sports drinks.  Eat bland, easy-to-digest foods in small amounts as you are able, such as: ? Bananas. ? Applesauce. ? Rice. ? Low-fat (lean) meats. ? Toast. ? Crackers.  Avoid drinking fluids that have a lot of sugar or caffeine in them. This includes energy drinks, sports drinks, and soda.  Avoid alcohol.  Avoid spicy or fatty foods.      General instructions  Take over-the-counter and prescription medicines only as told by your doctor.  Drink enough fluid to keep your pee (urine) pale yellow.  Wash your hands often with soap and water. If you cannot use soap and water, use hand sanitizer.  Make sure that all people in your home wash their hands well and often.  Rest at home while you get better.  Watch your condition for any changes.  Take slow and deep breaths when you feel sick to your stomach.  Keep all follow-up visits as told by your doctor. This is important. Contact a doctor if:  Your symptoms get worse.  You have new symptoms.  You have a fever.  You cannot drink fluids without throwing up.  You feel sick to your stomach for more than 2 days.  You feel light-headed or dizzy.  You have a headache.  You have muscle cramps.  You have a rash.  You  have pain while peeing. Get help right away if:  You have pain in your chest, neck, arm, or jaw.  You feel very weak or you pass out (faint).  You throw up again and again.  You have throw up that is bright red or looks like black coffee grounds.  You have bloody or black poop (stools) or poop that looks like tar.  You have a very bad headache, a stiff neck, or both.  You have very bad pain, cramping, or bloating in your belly (abdomen).  You have trouble breathing.  You are breathing very quickly.  Your heart is beating very quickly.  Your skin feels cold and clammy.  You feel  confused.  You have signs of losing too much water in your body, such as: ? Dark pee, very little pee, or no pee. ? Cracked lips. ? Dry mouth. ? Sunken eyes. ? Sleepiness. ? Weakness. These symptoms may be an emergency. Do not wait to see if the symptoms will go away. Get medical help right away. Call your local emergency services (911 in the [place] Do not drive yourself to the hospital. Summary  Nausea is feeling sick to your stomach or feeling that you are about to throw up (vomit). Vomiting is when food in your stomach is thrown up and out of the mouth.  Follow instructions from your doctor about eating and drinking to keep from losing too much water in your body.  Take over-the-counter and prescription medicines only as told by your doctor.  Contact your doctor if your symptoms get worse or you have new symptoms.  Keep all follow-up visits as told by your doctor. This is important. This information is not intended to replace advice given to you by your health care provider. Make sure you discuss any questions you have with your health care provider. Document Revised: 06/03/2018 Document Reviewed: 07/20/2017 Elsevier Patient Education  2021 [employer].

## 2020-06-23 NOTE — Progress Notes (Addendum)
Subjective:  Patient is doing overall well today. He has continued to not have nausea or vomiting. He has tolerated his liquid diet well.   He endorses ongoing pain at the left lateral chest wall after his coughing spell that worsens with sitting up, twisting, coughing, and pressing on the area. He states that the lidocaine patch has not give him any relief.  Objective:  Vital signs in last 24 hours: Vitals:   06/22/20 0946 06/22/20 1918 06/22/20 2043 06/23/20 0752  BP: (!) 143/69  138/70   Pulse: 96  79 86  Resp: 19  18 17   Temp: 98 F (36.7 C)  98.3 F (36.8 C)   TempSrc: Oral  Oral   SpO2: 94% 94%  95%  Weight:      Height:       Weight change:  No intake or output data in the 24 hours ending 06/23/20 1047  Physical Exam Constitutional:      Appearance: Normal appearance.  HENT:     Head: Normocephalic and atraumatic.  Eyes:     Extraocular Movements: Extraocular movements intact.  Cardiovascular:     Rate and Rhythm: Normal rate and regular rhythm.     Pulses: Normal pulses.     Heart sounds: Normal heart sounds.  Pulmonary:     Effort: Pulmonary effort is normal.     Breath sounds: Normal breath sounds.  Abdominal:     General: Bowel sounds are normal.     Palpations: Abdomen is soft.  Musculoskeletal:        General: Normal range of motion.     Cervical back: Normal range of motion and neck supple.     Comments: TTP of the left lateral chest wall with no obvious deformities  Skin:    General: Skin is warm and dry.  Neurological:     General: No focal deficit present.     Mental Status: He is alert and oriented to person, place, and time. Mental status is at baseline.  Psychiatric:        Mood and Affect: Mood normal.        Behavior: Behavior normal.        Thought Content: Thought content normal.     Assessment/Plan:  Active Problems:   Abnormal CT of the abdomen   Refractory nausea and vomiting   Gastric lesion   Esophageal stricture    Nausea and vomiting in adult  Refractory Nausea and Vomiting Patient doing well this morning. He is afebrile. His WBC continues to trend down. H&H is stable. He has continued to have no nausea or vomiting since admission. He tolerated his liquid diet this morning well. GI recommended f/u outpatient gastric ultrasound for further evaluation of thickened gastric wall given essentially normal EGD. -Discharge this afternoon with plans to follow up with GI for OP gastric ultrasound -Will follow up on pending FOBT  Left Lateral Chest Wall Pain:  Patient continues to have left lateral chest wall pain after a coughing fit two days ago. Patient states that the lidocaine patch did not offer relief. Due to pain being positional, pleuritic, and increased with palpation, likely MSK in nature but esophageal perforation on differential given recent EGD. Patient already on baclofen, due to kidney function will hold off on systemic NSAIDS -Will get CXR to ensure no perforation or other pathology contributing to new onset chest wall pain -If normal CXR, will discharge on diclofenac 1% topical gel 2 g Q6H prn for pain  Hypertension -  Clonidine 0.1 mg daily - Norvasc 10mg  daily  - Enalapril 20mg  in the morning and 10mg  at night  COPD -Home albuterol -Home Dulera  Stage 3b CKD Patient's kidney function continues to improve and is stable at Cr 1.96 and GFR 33, likely his baseline. -Check BMP at follow up  GERD -Continue home Protonix 20 mg twice daily before meals  History of coronary stenting and CVA Patient underwent EGD two days ago with no biopsy. Continued yesterday. -Plavix 75 mg daily   Bilateral indeterminate renal lesions Patient's MRI demonstrates bilateral lesions in the kidney; the left mass has grown in size since 2017. Patient continues to be stable without flank pain or dysuria. -Will follow up outpatient for further evaluation   LOS: 1 day   Mikal Plane, Medical  Student 06/23/2020, 10:47 AM Pager: [phone] After 5pm on weekdays and 1pm on weekends: On Call pager [phone]

## 2020-06-24 ENCOUNTER — Other Ambulatory Visit: Payer: Self-pay

## 2020-06-24 ENCOUNTER — Encounter (HOSPITAL_COMMUNITY): Payer: Self-pay | Admitting: Internal Medicine

## 2020-06-24 ENCOUNTER — Telehealth: Payer: Self-pay

## 2020-06-24 DIAGNOSIS — R634 Abnormal weight loss: Secondary | ICD-10-CM

## 2020-06-24 DIAGNOSIS — R933 Abnormal findings on diagnostic imaging of other parts of digestive tract: Secondary | ICD-10-CM

## 2020-06-24 NOTE — Telephone Encounter (Signed)
-----   Message from Milus Banister, MD sent at 06/24/2020  7:48 AM EDT ----- Regarding: RE: Upper endoscopic ultrasound John, I agree that clinically and radiographically this is very concerning.  Endoscopic ultrasound certainly best next step.  Eamonn Sermeno, He needs upper endoscopic ultrasound with myself or Dr. Rush Landmark, first available for abnormal stomach, weight loss.  Please let me know if this is going to be more than 2 or 3 weeks out.    ----- Message ----- From: Irene Shipper, MD Sent: 06/21/2020  12:44 PM EDT To: Milus Banister, MD, Irene Shipper, MD, # Subject: Upper endoscopic ultrasound                    Gentleman, I saw this patient in the hospital yesterday when he presented with short-lived nausea, vomiting, and abdominal discomfort.  Over the past 6 months he has had decreased appetite and weight loss.  Not much in the way of pain.  CT scan identified abnormal thickening of the proximal stomach.  Endoscopy today was unremarkable.  The proximal gastric mucosa, overlying, was normal.  May have had a slightly congested appearance.  Was not biopsied (on Plavix). Given his symptom complex and the abnormality on CT, I think that an endoscopic ultrasound is warranted to look for abnormal thickening of the stomach below the gastric mucosa proper.  The patient is likely going home.  If there is agreement, and this examination can be performed electively, I would be grateful.  I did discuss this with the patient and provided him a copy of my endoscopy report.  Thanks JP

## 2020-06-24 NOTE — Telephone Encounter (Signed)
EUS has been scheduled for 07/18/20 at 1115 am  COVID test 5/23 at 1010  plavix letter to be sent - need to know who prescribes  Left message on machine to call back

## 2020-06-25 NOTE — Telephone Encounter (Signed)
Left message on machine to call back  

## 2020-06-26 NOTE — Telephone Encounter (Signed)
The pt has been advised of the EUS appt and COVID test.  Dr Sherral Hammers the PCP in [place] prescribes the plavix.  Will send a letter

## 2020-06-26 NOTE — Telephone Encounter (Signed)
Left message on machine to call back  

## 2020-06-26 NOTE — Telephone Encounter (Signed)
Letter sent to [facility] in regards to plavix.

## 2020-07-08 ENCOUNTER — Emergency Department (HOSPITAL_COMMUNITY): Payer: No Typology Code available for payment source

## 2020-07-08 ENCOUNTER — Other Ambulatory Visit: Payer: Self-pay

## 2020-07-08 ENCOUNTER — Observation Stay (HOSPITAL_COMMUNITY)
Admission: EM | Admit: 2020-07-08 | Discharge: 2020-07-10 | Disposition: A | Discharge status: Home / Self Care | Payer: No Typology Code available for payment source | Attending: Cardiovascular Disease | Admitting: Cardiovascular Disease

## 2020-07-08 DIAGNOSIS — I2582 Chronic total occlusion of coronary artery: Secondary | ICD-10-CM | POA: Diagnosis not present

## 2020-07-08 DIAGNOSIS — R4182 Altered mental status, unspecified: Secondary | ICD-10-CM | POA: Diagnosis not present

## 2020-07-08 DIAGNOSIS — I129 Hypertensive chronic kidney disease with stage 1 through stage 4 chronic kidney disease, or unspecified chronic kidney disease: Secondary | ICD-10-CM | POA: Insufficient documentation

## 2020-07-08 DIAGNOSIS — R0789 Other chest pain: Secondary | ICD-10-CM | POA: Diagnosis present

## 2020-07-08 DIAGNOSIS — Z20822 Contact with and (suspected) exposure to covid-19: Secondary | ICD-10-CM | POA: Diagnosis not present

## 2020-07-08 DIAGNOSIS — E039 Hypothyroidism, unspecified: Secondary | ICD-10-CM | POA: Diagnosis not present

## 2020-07-08 DIAGNOSIS — J449 Chronic obstructive pulmonary disease, unspecified: Secondary | ICD-10-CM | POA: Insufficient documentation

## 2020-07-08 DIAGNOSIS — R079 Chest pain, unspecified: Secondary | ICD-10-CM | POA: Diagnosis present

## 2020-07-08 DIAGNOSIS — I2510 Atherosclerotic heart disease of native coronary artery without angina pectoris: Principal | ICD-10-CM | POA: Diagnosis present

## 2020-07-08 DIAGNOSIS — J441 Chronic obstructive pulmonary disease with (acute) exacerbation: Secondary | ICD-10-CM | POA: Diagnosis present

## 2020-07-08 DIAGNOSIS — N1832 Chronic kidney disease, stage 3b: Secondary | ICD-10-CM | POA: Insufficient documentation

## 2020-07-08 DIAGNOSIS — Z8673 Personal history of transient ischemic attack (TIA), and cerebral infarction without residual deficits: Secondary | ICD-10-CM | POA: Diagnosis not present

## 2020-07-08 DIAGNOSIS — Z7982 Long term (current) use of aspirin: Secondary | ICD-10-CM | POA: Insufficient documentation

## 2020-07-08 DIAGNOSIS — Z79899 Other long term (current) drug therapy: Secondary | ICD-10-CM | POA: Insufficient documentation

## 2020-07-08 DIAGNOSIS — I10 Essential (primary) hypertension: Secondary | ICD-10-CM | POA: Diagnosis present

## 2020-07-08 DIAGNOSIS — R0781 Pleurodynia: Secondary | ICD-10-CM | POA: Diagnosis present

## 2020-07-08 DIAGNOSIS — F17201 Nicotine dependence, unspecified, in remission: Secondary | ICD-10-CM

## 2020-07-08 LAB — CBC WITH DIFFERENTIAL/PLATELET
Abs Immature Granulocytes: 0.03 10*3/uL (ref 0.00–0.07)
Basophils Absolute: 0.1 10*3/uL (ref 0.0–0.1)
Basophils Relative: 1 %
Eosinophils Absolute: 0.2 10*3/uL (ref 0.0–0.5)
Eosinophils Relative: 2 %
HCT: 31.5 % — ABNORMAL LOW (ref 39.0–52.0)
Hemoglobin: 9.8 g/dL — ABNORMAL LOW (ref 13.0–17.0)
Immature Granulocytes: 0 %
Lymphocytes Relative: 12 %
Lymphs Abs: 1.1 10*3/uL (ref 0.7–4.0)
MCH: 29.8 pg (ref 26.0–34.0)
MCHC: 31.1 g/dL (ref 30.0–36.0)
MCV: 95.7 fL (ref 80.0–100.0)
Monocytes Absolute: 0.7 10*3/uL (ref 0.1–1.0)
Monocytes Relative: 7 %
Neutro Abs: 7.2 10*3/uL (ref 1.7–7.7)
Neutrophils Relative %: 78 %
Platelets: 358 10*3/uL (ref 150–400)
RBC: 3.29 MIL/uL — ABNORMAL LOW (ref 4.22–5.81)
RDW: 14.1 % (ref 11.5–15.5)
WBC: 9.3 10*3/uL (ref 4.0–10.5)
nRBC: 0 % (ref 0.0–0.2)

## 2020-07-08 LAB — BASIC METABOLIC PANEL
Anion gap: 7 (ref 5–15)
BUN: 16 mg/dL (ref 8–23)
CO2: 28 mmol/L (ref 22–32)
Calcium: 8.5 mg/dL — ABNORMAL LOW (ref 8.9–10.3)
Chloride: 102 mmol/L (ref 98–111)
Creatinine, Ser: 1.87 mg/dL — ABNORMAL HIGH (ref 0.61–1.24)
GFR, Estimated: 35 mL/min — ABNORMAL LOW (ref >60)
Glucose, Bld: 175 mg/dL — ABNORMAL HIGH (ref 70–99)
Potassium: 4.5 mmol/L (ref 3.5–5.1)
Sodium: 137 mmol/L (ref 135–145)

## 2020-07-08 LAB — TROPONIN I (HIGH SENSITIVITY)
Troponin I (High Sensitivity): 5 ng/L (ref <18)
Troponin I (High Sensitivity): 4 ng/L (ref <18)

## 2020-07-08 MED ORDER — NITROGLYCERIN 0.4 MG SL SUBL
0.4000 mg | SUBLINGUAL_TABLET | Freq: Once | SUBLINGUAL | Status: AC
  Administered 2020-07-08: 0.4 mg via SUBLINGUAL
  Filled 2020-07-08: qty 1

## 2020-07-08 MED ORDER — BACLOFEN 10 MG PO TABS
10.0000 mg | ORAL_TABLET | Freq: Every day | ORAL | Status: DC
  Administered 2020-07-08 – 2020-07-09 (×2): 10 mg via ORAL
  Filled 2020-07-08 (×2): qty 1

## 2020-07-08 MED ORDER — ASPIRIN EC 81 MG PO TBEC
81.0000 mg | DELAYED_RELEASE_TABLET | Freq: Every day | ORAL | Status: DC
  Administered 2020-07-10: 81 mg via ORAL
  Filled 2020-07-08: qty 1

## 2020-07-08 MED ORDER — PRAVASTATIN SODIUM 40 MG PO TABS
40.0000 mg | ORAL_TABLET | Freq: Every day | ORAL | Status: DC
  Administered 2020-07-09: 40 mg via ORAL
  Filled 2020-07-08 (×2): qty 1

## 2020-07-08 MED ORDER — FENTANYL CITRATE (PF) 100 MCG/2ML IJ SOLN
50.0000 ug | Freq: Once | INTRAMUSCULAR | Status: AC
  Administered 2020-07-08: 50 ug via INTRAVENOUS
  Filled 2020-07-08: qty 2

## 2020-07-08 MED ORDER — NITROGLYCERIN 0.4 MG SL SUBL
0.4000 mg | SUBLINGUAL_TABLET | SUBLINGUAL | Status: DC | PRN
  Administered 2020-07-08: 0.4 mg via SUBLINGUAL
  Filled 2020-07-08: qty 1

## 2020-07-08 MED ORDER — LEVOTHYROXINE SODIUM 50 MCG PO TABS
50.0000 ug | ORAL_TABLET | Freq: Every day | ORAL | Status: DC
  Administered 2020-07-09 – 2020-07-10 (×2): 50 ug via ORAL
  Filled 2020-07-08 (×2): qty 1

## 2020-07-08 MED ORDER — ONDANSETRON HCL 4 MG/2ML IJ SOLN
4.0000 mg | Freq: Four times a day (QID) | INTRAMUSCULAR | Status: DC | PRN
Start: 2020-07-08 — End: 2020-07-08

## 2020-07-08 MED ORDER — HYDROCODONE-ACETAMINOPHEN 10-325 MG PO TABS
1.0000 | ORAL_TABLET | Freq: Two times a day (BID) | ORAL | Status: DC | PRN
  Administered 2020-07-08: 1 via ORAL
  Filled 2020-07-08: qty 1

## 2020-07-08 MED ORDER — POLYETHYLENE GLYCOL 3350 17 G PO PACK: 17.0000 g | PACK | Freq: Every day | ORAL | Status: DC | PRN

## 2020-07-08 MED ORDER — PANTOPRAZOLE SODIUM 40 MG PO TBEC
40.0000 mg | DELAYED_RELEASE_TABLET | Freq: Two times a day (BID) | ORAL | Status: DC
  Administered 2020-07-08 – 2020-07-10 (×4): 40 mg via ORAL
  Filled 2020-07-08 (×4): qty 1

## 2020-07-08 MED ORDER — MELATONIN 3 MG PO TABS
6.0000 mg | ORAL_TABLET | Freq: Every day | ORAL | Status: DC
  Administered 2020-07-08 – 2020-07-09 (×2): 6 mg via ORAL
  Filled 2020-07-08 (×2): qty 2

## 2020-07-08 MED ORDER — GABAPENTIN 300 MG PO CAPS
600.0000 mg | ORAL_CAPSULE | Freq: Every day | ORAL | Status: DC
  Administered 2020-07-08: 600 mg via ORAL
  Filled 2020-07-08: qty 2

## 2020-07-08 MED ORDER — BUDESONIDE 0.25 MG/2ML IN SUSP
0.2500 mg | Freq: Two times a day (BID) | RESPIRATORY_TRACT | Status: DC
  Administered 2020-07-08 – 2020-07-10 (×4): 0.25 mg via RESPIRATORY_TRACT
  Filled 2020-07-08 (×5): qty 2

## 2020-07-08 MED ORDER — ALBUTEROL SULFATE (2.5 MG/3ML) 0.083% IN NEBU
2.5000 mg | INHALATION_SOLUTION | Freq: Four times a day (QID) | RESPIRATORY_TRACT | Status: DC | PRN
  Administered 2020-07-08 – 2020-07-09 (×2): 2.5 mg via RESPIRATORY_TRACT
  Filled 2020-07-08 (×2): qty 3

## 2020-07-08 MED ORDER — GABAPENTIN 300 MG PO CAPS: 300.0000 mg | ORAL_CAPSULE | Freq: Two times a day (BID) | ORAL | Status: DC

## 2020-07-08 MED ORDER — METOPROLOL TARTRATE 25 MG PO TABS
25.0000 mg | ORAL_TABLET | Freq: Every day | ORAL | Status: DC
  Administered 2020-07-09 – 2020-07-10 (×2): 25 mg via ORAL
  Filled 2020-07-08 (×2): qty 1

## 2020-07-08 MED ORDER — IPRATROPIUM-ALBUTEROL 0.5-2.5 (3) MG/3ML IN SOLN
3.0000 mL | Freq: Four times a day (QID) | RESPIRATORY_TRACT | Status: DC
  Administered 2020-07-08 – 2020-07-09 (×2): 3 mL via RESPIRATORY_TRACT
  Filled 2020-07-08 (×2): qty 3

## 2020-07-08 MED ORDER — HEPARIN SODIUM (PORCINE) 5000 UNIT/ML IJ SOLN
5000.0000 [IU] | Freq: Three times a day (TID) | INTRAMUSCULAR | Status: DC
  Administered 2020-07-08 – 2020-07-09 (×2): 5000 [IU] via SUBCUTANEOUS
  Filled 2020-07-08 (×4): qty 1

## 2020-07-08 MED ORDER — ACETAMINOPHEN 325 MG PO TABS: 650.0000 mg | ORAL_TABLET | ORAL | Status: DC | PRN

## 2020-07-08 MED ORDER — NITROGLYCERIN 0.4 MG SL SUBL
0.4000 mg | SUBLINGUAL_TABLET | Freq: Once | SUBLINGUAL | Status: AC
  Administered 2020-07-08: 0.4 mg via SUBLINGUAL

## 2020-07-08 MED ORDER — CLONIDINE HCL 0.1 MG PO TABS
0.1000 mg | ORAL_TABLET | Freq: Every day | ORAL | Status: DC
  Administered 2020-07-09 – 2020-07-10 (×2): 0.1 mg via ORAL
  Filled 2020-07-08 (×2): qty 1

## 2020-07-08 MED ORDER — AMLODIPINE BESYLATE 5 MG PO TABS
5.0000 mg | ORAL_TABLET | Freq: Every day | ORAL | Status: DC
  Administered 2020-07-09 – 2020-07-10 (×2): 5 mg via ORAL
  Filled 2020-07-08 (×2): qty 1

## 2020-07-08 MED ORDER — ENALAPRIL MALEATE 20 MG PO TABS: 20.0000 mg | ORAL_TABLET | Freq: Every day | ORAL | Status: DC

## 2020-07-08 MED ORDER — CLOPIDOGREL BISULFATE 75 MG PO TABS
75.0000 mg | ORAL_TABLET | Freq: Every day | ORAL | Status: DC
  Administered 2020-07-09 – 2020-07-10 (×2): 75 mg via ORAL
  Filled 2020-07-08 (×2): qty 1

## 2020-07-08 MED ORDER — ONDANSETRON HCL 4 MG PO TABS: 4.0000 mg | ORAL_TABLET | Freq: Four times a day (QID) | ORAL | Status: DC | PRN

## 2020-07-08 MED ORDER — MOMETASONE FUROATE 220 MCG/INH IN AEPB: 1.0000 | INHALATION_SPRAY | Freq: Every day | RESPIRATORY_TRACT | Status: DC

## 2020-07-08 MED ORDER — GABAPENTIN 300 MG PO CAPS: 300.0000 mg | ORAL_CAPSULE | ORAL | Status: DC

## 2020-07-08 MED ORDER — SODIUM CHLORIDE 0.9% FLUSH
3.0000 mL | Freq: Two times a day (BID) | INTRAVENOUS | Status: DC
  Administered 2020-07-08 – 2020-07-10 (×2): 3 mL via INTRAVENOUS

## 2020-07-08 NOTE — Plan of Care (Signed)
   Shared Decision Making/Informed Consent  The risks [stroke (1 in 1000), death (1 in 1000), kidney failure [usually temporary] (1 in 500), bleeding (1 in 200), allergic reaction [possibly serious] (1 in 200)], benefits (diagnostic support and management of coronary artery disease) and alternatives of a cardiac catheterization were discussed in detail with [place] and he is willing to proceed.

## 2020-07-08 NOTE — Consult Note (Addendum)
Cardiology Consultation:   [name] MRN: [id number]; DOB: Oct 03, 1934  Admit date: 07/08/2020 Date of Consult: 07/08/2020  PCP:  System, Provider Not In   [place] Providers Cardiologist:  Dr Lia Foyer last seen  in 2013    Meadows Profile:   Wayne Meadows is a 86 y.o. male with a hx of CAD s/p PCI, hypertension, hyperlipidemia, CVA in 2009, COPD, GERD, CKD stage III, bilateral renal lesions,  who is being seen 07/08/2020 for Wayne evaluation of chest pain at Wayne request of Dr. Roslynn Amble.   History of Present Illness:   Wayne Meadows has long standing CAD, received stent placement in 2009 at [facility].   Wayne Meadows was admitted for NSTEMI on 02/23/2011 and emergent heart cath with performed by Dr. Lia Foyer , showed 99% stenosis of large RCA s/p PCI with BMS,  total occlusion of small diagonal 1 with some collateralization.   AV circumflex diffusely plaqued with patent stent and 60% beyond stent, LVEF is estimated 55 to 65%.  Wayne Meadows was bradycardic during Wayne procedure and required epinephrine/atropine/transvenous pacing.  Wayne Meadows also developed post-procedure atrial fibrillation after Wayne procedure.  Anticoagulation was not pursued due to known hemorrhagic renal cyst.   Wayne Meadows was admitted shortly after on 03/18/11 for unstable angina, decision was made to repeat cardiac cath performed by Dr. Aundra Dubin, which revealed patent proximal to mid RCA stent.  No significant change of left system including 50% proximal LAD stenosis after D1, Chronically subtotally occluded D1 that was a small vessel with good collateral filling beyond Wayne obstruction, large ramus with 30% proximal stenosis, left circumflex was a small vessel and patent mid left circumflex stent.  His pain was felt noncardiac and epigastric in origin. Wayne Meadows was given protonix.   Wayne Meadows was last evaluated by Dr. Lia Foyer in Wayne office 08/25/2011, reported some right upper quadrant discomfort and plan for gallbladder surgery.  Wayne Meadows was recommended  exercise stress testing before gallbladder surgery.  Wayne Meadows was maintained on medical therapy with aspirin, plavix, enalapril, metoprolol, simvastatin at Wayne time.   Wayne Meadows had no further follow up Dr Enid Baas since 2013.   Wayne Meadows was recently admitted here from 06/20/2020-06/23/2020 for refractory nausea and vomiting. EGD showed incidental esophageal stricture, otherwise normal EGD, Wayne Meadows was recommended outpatient EUS. Wayne Meadows had CTAP done on 4/28 which showed irregular gastric wall thickening about Wayne cardia and greater curvature >3.5cm. ? Ileus. bilateral renal cysts.  Wayne Meadows complained of some left-sided chest wall pain and was treated with topical diclofenac.   Wayne Meadows returned to Wayne ER today with chief complaints of midsternal chest pain radiating to his back.  Wayne Meadows described pain as a burning sensation.  Wayne Meadows states his pain finally improved after Wayne Meadows took 6 tablets of sublingual nitroglycerin and 324 mg aspirin and IV fentanyl at ED.  Wayne Meadows reports intermittent dull achy chest pain over Wayne past several weeks, but pain was much more severe today. Wayne Meadows states pain is not triggered by exertion, nor relieved by resting. Wayne Meadows has been taking all his medication regularly. Wayne Meadows has not followed up with cardiology since 2013. Wayne Meadows reports mild dizziness earlier while walking to Wayne bathroom. Wayne Meadows reports chronic SOB with exertion due to COPD, denied worsening of SOB.  Wayne Meadows denied any diaphoresis, paresthesia, nausea, vomiting.   Diagnostic work-up today at ED revealed elevated glucose 175, creatinine 1.87, GFR 35.  CBC revealed hemoglobin 9.8, remarkable. Hs troponin negative x2.  Chest x-ray revealed COPD without acute finding.  EKG showed sinus rhythm with ventricular  rate of 67 bpm, no acute change.  Wayne Meadows is hemodynamically stable at ED.  Wayne Meadows was given 50 mcg of fentanyl, 0.4 mg sublingual nitroglycerin x2.  Cardiology consult was requested for further management.     Past Medical History:  Diagnosis Date  . Arthritis   . Carotid stenosis   . COPD  (chronic obstructive pulmonary disease) ([place])   . Coronary artery disease    S/p PCI 2011;  NSTEMI 12/12:  LHC/PCI 02/23/11: LAD 60% after Wayne septal perforator, D1 occluded with distal collaterals, proximal RI 30-40%, AV circumflex stent patent with 60% stenosis after Wayne stent, RCA 99%, EF 60-65%.  His RCA was treated with a bare-metal stent  . CVA (cerebral infarction) 2011   Right cerebral; total obstruction of Wayne right ICA  . Diverticulitis   . Hypertension   . Rectal bleeding 07/2015  . Renal carcinoma ([place])   . Stroke ([place])   . Tobacco abuse, in remission     Past Surgical History:  Procedure Laterality Date  . COLON SURGERY    . COLONOSCOPY N/A 08/22/2015   Procedure: COLONOSCOPY;  Surgeon: [name], MD;  Location: [facility] ENDOSCOPY;  Service: Endoscopy;  Laterality: N/A;  . ESOPHAGOGASTRODUODENOSCOPY (EGD) WITH PROPOFOL N/A 06/21/2020   Procedure: ESOPHAGOGASTRODUODENOSCOPY (EGD) WITH PROPOFOL;  Surgeon: Irene Shipper, MD;  Location: [facility] ENDOSCOPY;  Service: Endoscopy;  Laterality: N/A;  . hip relacement    . KIDNEY SURGERY    . LEFT HEART CATHETERIZATION WITH CORONARY ANGIOGRAM N/A 02/23/2011   Procedure: LEFT HEART CATHETERIZATION WITH CORONARY ANGIOGRAM;  Surgeon: [name], MD;  Location: [facility] CATH LAB;  Service: Cardiovascular;  Laterality: N/A;  . LEFT HEART CATHETERIZATION WITH CORONARY ANGIOGRAM N/A 03/18/2011   Procedure: LEFT HEART CATHETERIZATION WITH CORONARY ANGIOGRAM;  Surgeon: [name], MD;  Location: [facility] CATH LAB;  Service: Cardiovascular;  Laterality: N/A;  . PERCUTANEOUS CORONARY STENT INTERVENTION (PCI-S) N/A 02/23/2011   Procedure: PERCUTANEOUS CORONARY STENT INTERVENTION (PCI-S);  Surgeon: [name], MD;  Location: [facility] CATH LAB;  Service: Cardiovascular;  Laterality: N/A;  . TEMPORARY PACEMAKER INSERTION N/A 02/23/2011   Procedure: TEMPORARY PACEMAKER INSERTION;  Surgeon: [name], MD;  Location: [facility] CATH LAB;  Service: Cardiovascular;   Laterality: N/A;     Home Medications:  Prior to Admission medications   Medication Sig Start Date End Date Taking? Authorizing Provider  albuterol (VENTOLIN HFA) 108 (90 Base) MCG/ACT inhaler Inhale 1 puff into Wayne lungs every 6 (six) hours as needed for shortness of breath.    [provider]  amLODipine (NORVASC) 5 MG tablet Take 5 mg by mouth daily.    [provider]  aspirin EC 81 MG tablet Take 81 mg by mouth daily.    [provider]  baclofen (LIORESAL) 10 MG tablet Take 10 mg by mouth at bedtime.    [provider]  cloNIDine (CATAPRES) 0.1 MG tablet Take 1 tablet (0.1 mg total) by mouth daily. 06/24/20   Harvie Heck, MD  clopidogrel (PLAVIX) 75 MG tablet Take 75 mg by mouth daily.    [provider]  diclofenac Sodium (VOLTAREN) 1 % GEL Apply 2 g topically 4 (four) times daily. 06/23/20   Harvie Heck, MD  enalapril (VASOTEC) 20 MG tablet Take 20 mg by mouth at bedtime.    [provider]  EPINEPHrine (EPI-PEN) 0.3 mg/0.3 mL DEVI Inject 0.3 mg into Wayne muscle once as needed (for anaphylactic reaction).     [provider]  gabapentin (NEURONTIN) 300 MG capsule Take 300-600 mg by mouth See admin instructions. Take 300 mg by mouth in Wayne morning then 300 mg at noon then 600 mg at bedtime    [provider]  HYDROcodone-acetaminophen (NORCO) 10-325 MG tablet Take 1 tablet by mouth 2 (two) times daily.    [provider]  Ipratropium-Albuterol (COMBIVENT) 20-100 MCG/ACT AERS respimat Inhale 1 puff into Wayne lungs 4 (four) times daily.    [provider]  levothyroxine (SYNTHROID) 75 MCG tablet Take 1 tablet (75 mcg total) by mouth daily at 6 (six) AM. 06/24/20   Aslam, Loralyn Freshwater, MD  Melatonin 3 MG TABS Take 6 mg by mouth at bedtime.    [provider]  metoprolol tartrate (LOPRESSOR) 25 MG tablet Take 25 mg by mouth daily.    [provider]  mometasone (ASMANEX) 220 MCG/INH inhaler Inhale 1  puff into Wayne lungs at bedtime.    [provider]  Multiple Vitamin (MULTIVITAMIN) tablet Take 1 tablet by mouth daily.    [provider]  nitroGLYCERIN (NITROSTAT) 0.4 MG SL tablet Place 0.4 mg under Wayne tongue every 5 (five) minutes as needed for chest pain.    [provider]  ondansetron (ZOFRAN) 4 MG tablet Take 1 tablet (4 mg total) by mouth every 6 (six) hours as needed for nausea. 06/23/20   Harvie Heck, MD  pantoprazole (PROTONIX) 40 MG tablet Take 1 tablet (40 mg total) by mouth 2 (two) times daily. 06/23/20   Harvie Heck, MD  polyethylene glycol (MIRALAX / GLYCOLAX) packet Take 17 g by mouth daily. Meadows taking differently: Take 17 g by mouth daily as needed for mild constipation or moderate constipation. 08/22/15   Lavina Hamman, MD  pravastatin (PRAVACHOL) 40 MG tablet Take 40 mg by mouth at bedtime.    [provider]    Inpatient Medications: Scheduled Meds:  Continuous Infusions:  PRN Meds:   Allergies:    Allergies  Allergen Reactions  . Bee Venom Anaphylaxis    Has epi pen  . Influenza Vaccines Other (See Comments)    "Mortally sick for 2 weeks"    Social History:   Social History   Socioeconomic History  . Marital status: Divorced    Spouse name: Not on file  . Number of children: Not on file  . Years of education: Not on file  . Highest education level: Not on file  Occupational History  . Not on file  Tobacco Use  . Smoking status: Former Smoker    Types: Cigarettes    Quit date: 02/24/2007    Years since quitting: 13.3  . Smokeless tobacco: Never Used  Substance and Sexual Activity  . Alcohol use: Not Currently    Alcohol/week: 1.0 standard drink    Types: 1 Standard drinks or equivalent per week    Comment: socially   . Drug use: No  . Sexual activity: Not on file  Other Topics Concern  . Not on file  Social History Narrative  . Not on file   Social Determinants of Health   Financial Resource Strain:  Not on file  Food Insecurity: Not on file  Transportation Needs: Not on file  Physical Activity: Not on file  Stress: Not on file  Social Connections: Not on file  Intimate Partner Violence: Not on file    Family History:    Family History  Problem Relation Age of Onset  . Heart attack Other 82     ROS:  Constitutional: Denied fever, chills, malaise, night sweats Eyes: Denied vision change or loss Ears/Nose/Mouth/Throat: Denied ear ache, sore throat, coughing, sinus pain Cardiovascular: see HPI  Respiratory: chronic shortness of breath Gastrointestinal: Denied nausea, vomiting, abdominal pain, diarrhea Genital/Urinary: Denied dysuria, hematuria, urinary frequency/urgency Musculoskeletal: Denied muscle ache, joint pain, weakness Skin: Denied rash, wound Neuro: Denied headache, dizziness, syncope Psych: Denied history of depression/anxiety  Endocrine: Denied history of diabetes   Physical Exam/Data:   Vitals:   07/08/20 1245 07/08/20 1415 07/08/20 1430 07/08/20 1530  BP: (!) 146/64 (!) 146/69 (!) 147/58 (!) 162/66  Pulse: 62 65 63 63  Resp: 10 12 14 10   Temp:      TempSrc:      SpO2: 95% 96% 95% 94%  Weight:      Height:       No intake or output data in Wayne 24 hours ending 07/08/20 1656 Last 3 Weights 07/08/2020 06/20/2020 06/20/2020  Weight (lbs) 207 lb 197 lb 5 oz 207 lb  Weight (kg) 93.895 kg 89.5 kg 93.895 kg     Body mass index is 26.58 kg/m.   Vitals:  Vitals:   07/08/20 1430 07/08/20 1530  BP: (!) 147/58 (!) 162/66  Pulse: 63 63  Resp: 14 10  Temp:    SpO2: 95% 94%   General Appearance: In no apparent distress, laying in bed HEENT: Normocephalic, atraumatic. EOMs intact. Sclera anicteric.  Neck: Supple, trachea midline, no JVDs Cardiovascular: Regular rate and rhythm, normal S1-S2,  no murmur/rub/gallop Respiratory: Resting breathing unlabored, lungs sounds clear to auscultation bilaterally, no use of accessory muscles. On room air.  No wheezes,  rales or rhonchi.   Gastrointestinal: Bowel sounds positive, abdomen large and round, soft, non-tender, non-distended.  Extremities: Able to move all extremities in bed without difficulty, BLE 1+ edema with compression stocking in place Genitourinary: Genital exam not performed Musculoskeletal: Normal muscle bulk and tone, muscle strength 5/5 throughout, no limited range of motion, no swollen or erythematous joints Skin: Intact, warm, dry. No rashes or petechiae noted in exposed areas.  Neurologic: Alert, oriented to person, place and time. Fluent speech, no gross focal neuro deficit Psychiatric: Normal affect. Mood is appropriate.    EKG:  Wayne EKG was personally reviewed and demonstrates:  EKG showed sinus rhythm with ventricular rate of 67 bpm, no acute change.  Telemetry:  Telemetry was personally reviewed and demonstrates:  Sinus rhythm with HR 60s, occasional PVCs    Relevant CV Studies:  Echo from 02/08/20:  LeftVentricle: Systolic function is low normal. EF: 50-55%.  . TricuspidValve: There is mild regurgitation.  . TricuspidValve: Wayne right ventricular systolic pressure is mildly  elevated (37-49 mmHg).   Cardiac Cath on 03/18/2011:  Coronary angiography: Coronary dominance: right  Left mainstem: No significant disease.   Left anterior descending (LAD): 50% proximal LAD stenosis after D1 at Wayne 1st septal.  D1 was a small vessel and was subtotally occluded with collaterals filling Wayne distal vessel.   Left circumflex (LCx): Large ramus with 30% proximal stenosis.  Wayne AV LCx itself was a small vessel.  There was a patent mid-LCx stent.  Just proximal to Wayne stent there was a discrete 30-40% stenosis.    Right coronary artery (RCA): Patent proximal to mid RCA stent.  Luminal irregularities in RCA.   Left ventriculography: Not done (recent cath with normal EF on LV-gram).   Final Conclusions:  Patent RCA stent.  No change to appearance of Wayne left system  compared to prior study.  There was a chronically subtotally occluded D1 that was a small vessel with good collateral filling beyond Wayne obstruction. I suspect epigastric pain Wayne Meadows is having is noncardiac.   Recommendations: Discussed with Dr. Lia Foyer.  Will hold overnight.  Will start Protonix.    Cardiac catheterization on 02/23/2011:  Coronary angiography: Coronary dominance: right  Left mainstem: Free of critical disease  Left anterior descending (LAD): 60% stenosis at Wayne septal.  Small diagonal 1 with total occlusion proximally and some collateralization of Wayne D1 from D2.  Caliber of vessel is about 1-1.26mm.    Left circumflex (LCx): Ramus intermedius with 30-40% proximally.  Large distally.  AV circumflex diffusely plaqued with patent stent and 60% beyond stent.  Vessel is smaller in caliber  Right coronary artery (RCA): Large caliber vessel with large AM, PD, and PLA with mild irregularity.  Has 99% mid stenosis with [place] 2 flow.  Following PCI, 0% residual.  20% proximal irregularity.  Left ventriculography: Left ventricular systolic function is normal, LVEF is estimated at 55-65%, there is no significant mitral regurgitation   Laboratory Data:  High Sensitivity Troponin:   Recent Labs  Lab 07/08/20 0935 07/08/20 1147  TROPONINIHS 5 4     Chemistry Recent Labs  Lab 07/08/20 0935  NA 137  K 4.5  CL 102  CO2 28  GLUCOSE 175*  BUN 16  CREATININE 1.87*  CALCIUM 8.5*  GFRNONAA 35*  ANIONGAP 7    No results for input(s): PROT, ALBUMIN, AST, ALT, ALKPHOS, BILITOT in Wayne last 168 hours. Hematology Recent Labs  Lab 07/08/20 0935  WBC 9.3  RBC 3.29*  HGB 9.8*  HCT 31.5*  MCV 95.7  MCH 29.8  MCHC 31.1  RDW 14.1  PLT 358   BNPNo results for input(s): BNP, PROBNP in Wayne last 168 hours.  DDimer No results for input(s): DDIMER in Wayne last 168 hours.   Radiology/Studies:  DG Chest 2 View  Result Date: 07/08/2020 CLINICAL DATA:  Chest pain and back pain  EXAM: CHEST - 2 VIEW COMPARISON:  06/23/2020 FINDINGS: Normal heart size and mediastinal contours. Hyperinflation with diaphragm flattening and interstitial coarsening at Wayne bases. There is no edema, consolidation, effusion, or pneumothorax. IMPRESSION: COPD without acute superimposed finding. Electronically Signed   By: Monte Fantasia M.D.   On: 07/08/2020 10:39     Assessment and Plan:   Chest pain CAD s/p multiple PCI ( last in 2012 ) - presented with chest pain for 1 week, worsened today, continue have pain currently  - HS trop negative x2 - EKG without acute ischemic changes  - Last cardiac cath from 03/18/2011 revealed a 50% proximal LAD stenosis after D1, D1 was a small vessel with subtotal occlusion and collateralization, 30% proximal large ramus stenosis, small AV left Cx with patent mid left Cx stent, patent proximal to mid RCA stent (BMS) - Echo from 02/08/2020 with EF 50 to 55%, mild TR, mild RV systolic pressure elevated 37-49 mmHg - will admit to cardiac telemetry unit - recommend ischemic workup with left heart cath tomorrow, NPO after midnight, gentle IVF for renal hydration - continue GDMT with aspirin + Plavix, enalapril, metoprolol - repeat BMP, CBC, INR, lipid profile, A1C tomorrow   Hypertension - BP elevated, will resume home med amlodipine,  enalapril and metoprolol, consider wean off clonidine long term   HLD - LDL 73 from 2013 , will update lipid profile tomorrow  - continue pravastatin 40 mg daily   Hypothyroidism - resume levothyroxine , follow  up with PCP   GERD - continue PPI   COPD - no evidence of acute exacerbation   CKD stage IIIb - renal index near baseline - trend BMP daily, avoid nephrotoxin  - start IVF at 67ml.hr tonight for pre-cath renal hydration  Hx of CVA  - no residual deficit, on DAPT + statin      Risk Assessment/Risk Scores:   HEAR Score (for undifferentiated chest pain):  HEAR Score: 4       For questions or updates,  please contact [place] Please consult [email or web address] for contact info under    Signed, Margie Billet, NP  07/08/2020 4:56 PM   Meadows seen, examined. Available data reviewed. Agree with findings, assessment, and plan as outlined by Margie Billet, NP.  Wayne Meadows is independently interviewed and examined.  Wayne Meadows is an alert, oriented, elderly male in no distress.  HEENT is normal, JVP is normal, carotid upstrokes are normal without bruits, lungs are clear bilaterally, heart is regular rate and rhythm with no murmur gallop, abdomen is soft and nontender with no organomegaly, extremities have trace pretibial edema.  At Wayne time of my evaluation, Wayne Meadows continues to experience substernal chest discomfort.  Wayne Meadows states that this feels exactly like his prior cardiac pain and it is not similar to symptoms of gastroesophageal reflux that Wayne Meadows has experienced in Wayne past.  His EKG has no acute ischemic changes and this is personally reviewed today.  High-sensitivity troponin is normal at 5 and 4, respectively.  Wayne Meadows has a significant cardiac history with prior PCI in 2011 and 2012, most recently with bare-metal stenting of Wayne right coronary artery.  Wayne Meadows has not had cardiology follow-up since Dr. Maren Beach retirement in 2013.  After review of potential diagnostic options with both noninvasive and invasive assessment, I think it is most prudent to pursue limited contrast cardiac catheterization in this Meadows with known CAD, prior CVA, known total occlusion of Wayne right internal carotid artery, all features placing him at high risk of recurrent ischemia.  Wayne Meadows will be treated with nitroglycerin as needed.  We will hold on IV heparin with negative cardiac markers. I have reviewed Wayne risks, indications, and alternatives to cardiac catheterization, possible angioplasty, and stenting with Wayne Meadows. Risks include but are not limited to bleeding, infection, vascular injury, stroke, myocardial infection, arrhythmia,  kidney injury, radiation-related injury in Wayne case of prolonged fluoroscopy use, emergency cardiac surgery, and death. Wayne Meadows understands Wayne risks of serious complication is 1-2 in [id number] with diagnostic cardiac cath and 1-2% or less with angioplasty/stenting.  Wayne Meadows will be started on IV fluids this evening because of his chronic kidney disease with all precautions taken to prevent contrast nephropathy.  Wayne Meadows takes enalapril at nighttime and we will hold this tonight.  Sherren Mocha, M.D. 07/08/2020 5:06 PM      This note serve as H&P

## 2020-07-08 NOTE — ED Provider Notes (Signed)
[facility] EMERGENCY DEPARTMENT Provider Note   CSN: [id number] Arrival date & time: 07/08/20  8280     History Chief Complaint  Patient presents with  . Chest Pain    Wayne Meadows is a 86 y.o. male.  Presented to the emergency room with concern for chest pain.  Patient reports over the past couple weeks he has been having intermittent dull achy chest pains.  Mostly mild.  Then today it seemed to be more worse up to 10 out of 10 in severity.  Described as a burning sensation across his chest, also seemed to go to his shoulder blades at times.  States that he took 324 aspirin and nitro and this seemed to help his symptoms significantly.  Still having mild pain at present.  Also at times when short of breath.  No difficulty breathing at present.  CAD status post stent placement, CVA, hypertension, COPD  HPI  HPI: A 86 year old patient with a history of CVA, hypertension and hypercholesterolemia presents for evaluation of chest pain. Initial onset of pain was less than one hour ago. The patient's chest pain is sharp, is not worse with exertion and is relieved by nitroglycerin. The patient's chest pain is middle- or left-sided, is not well-localized, is not described as heaviness/pressure/tightness and does not radiate to the arms/jaw/neck. The patient does not complain of nausea and denies diaphoresis. The patient has no history of peripheral artery disease, has not smoked in the past 90 days, denies any history of treated diabetes, has no relevant family history of coronary artery disease (first degree relative at less than age 52) and does not have an elevated BMI (>=30).   Past Medical History:  Diagnosis Date  . Arthritis   . Carotid stenosis   . COPD (chronic obstructive pulmonary disease) ([place])   . Coronary artery disease    S/p PCI 2011;  NSTEMI 12/12:  LHC/PCI 02/23/11: LAD 60% after the septal perforator, D1 occluded with distal collaterals, proximal RI  30-40%, AV circumflex stent patent with 60% stenosis after the stent, RCA 99%, EF 60-65%.  His RCA was treated with a bare-metal stent  . CVA (cerebral infarction) 2011   Right cerebral; total obstruction of the right ICA  . Diverticulitis   . Hypertension   . Rectal bleeding 07/2015  . Renal carcinoma ([place])   . Stroke ([place])   . Tobacco abuse, in remission     Patient Active Problem List   Diagnosis Date Noted  . Nausea and vomiting in adult 06/22/2020  . Gastric lesion   . Esophageal stricture   . Refractory nausea and vomiting 06/20/2020  . Sepsis, unspecified organism ([place]) 04/11/2016  . COPD exacerbation ([place]) 04/11/2016  . CKD (chronic kidney disease), stage III ([place]) 04/11/2016  . Hyperbilirubinemia 04/11/2016  . Elevated lactic acid level   . Renal insufficiency   . Segmental colitis ([place]) 09/04/2015  . Generalized abdominal pain 09/04/2015  . Noninfectious gastroenteritis, unspecified   . Benign neoplasm of transverse colon   . Benign neoplasm of colon   . Bright red blood per rectum   . Acute blood loss anemia   . Dysphagia   . Renal cell cancer ([place])   . Essential hypertension   . Centrilobular emphysema ([place])   . Blood in stool 08/19/2015  . Rectal bleeding 08/19/2015  . Chest pain 04/08/2014  . CAP (community acquired pneumonia) 04/08/2014  . Community acquired pneumonia of left lower lobe of lung 04/08/2014  . Overweight (BMI  25.0-29.9) 04/08/2014  . Healthcare-associated pneumonia 10/26/2011  . Cholelithiasis 04/26/2011  . Atrial fibrillation ([place]) 04/26/2011  . Chest pain 03/19/2011  . Hypertension 03/19/2011  . Unstable angina ([place]) 03/18/2011    Class: Acute  . Lung nodule 03/02/2011  . Liver lesion 03/02/2011  . HLD (hyperlipidemia) 03/02/2011  . Abnormal CT of the abdomen 02/23/2011  . Non Q wave myocardial infarction ([place]) 02/22/2011    Class: Acute  . Cerebrovascular disease 02/22/2011  . Coronary artery disease due to lipid rich plaque  02/22/2011  . COPD (chronic obstructive pulmonary disease) ([place]) 02/22/2011  . Tobacco abuse, 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/22/2011    Past Surgical History:  Procedure Laterality Date  . COLON SURGERY    . COLONOSCOPY N/A 08/22/2015   Procedure: COLONOSCOPY;  Surgeon: [name], MD;  Location: [place] ENDOSCOPY;  Service: Endoscopy;  Laterality: N/A;  . ESOPHAGOGASTRODUODENOSCOPY (EGD) WITH PROPOFOL N/A 06/21/2020   Procedure: ESOPHAGOGASTRODUODENOSCOPY (EGD) WITH PROPOFOL;  Surgeon: Irene Shipper, MD;  Location: [facility] ENDOSCOPY;  Service: Endoscopy;  Laterality: N/A;  . hip relacement    . KIDNEY SURGERY    . LEFT HEART CATHETERIZATION WITH CORONARY ANGIOGRAM N/A 02/23/2011   Procedure: LEFT HEART CATHETERIZATION WITH CORONARY ANGIOGRAM;  Surgeon: [name], MD;  Location: [facility] CATH LAB;  Service: Cardiovascular;  Laterality: N/A;  . LEFT HEART CATHETERIZATION WITH CORONARY ANGIOGRAM N/A 03/18/2011   Procedure: LEFT HEART CATHETERIZATION WITH CORONARY ANGIOGRAM;  Surgeon: [name], MD;  Location: [facility] CATH LAB;  Service: Cardiovascular;  Laterality: N/A;  . PERCUTANEOUS CORONARY STENT INTERVENTION (PCI-S) N/A 02/23/2011   Procedure: PERCUTANEOUS CORONARY STENT INTERVENTION (PCI-S);  Surgeon: [name], MD;  Location: [facility] CATH LAB;  Service: Cardiovascular;  Laterality: N/A;  . TEMPORARY PACEMAKER INSERTION N/A 02/23/2011   Procedure: TEMPORARY PACEMAKER INSERTION;  Surgeon: [name], MD;  Location: [facility] CATH LAB;  Service: Cardiovascular;  Laterality: N/A;       Family History  Problem Relation Age of Onset  . Heart attack Other 54    Social History   Tobacco Use  . Smoking status: Former Smoker    Types: Cigarettes    Quit date: 02/24/2007    Years since quitting: 13.3  . Smokeless tobacco: Never Used  Substance Use Topics  . Alcohol use: Not Currently    Alcohol/week: 1.0 standard drink    Types: 1 Standard drinks or equivalent per week    Comment: socially   . Drug use:  No    Home Medications Prior to Admission medications   Medication Sig Start Date End Date Taking? Authorizing Provider  albuterol (VENTOLIN HFA) 108 (90 Base) MCG/ACT inhaler Inhale 1 puff into the lungs every 6 (six) hours as needed for shortness of breath.    [provider]  amLODipine (NORVASC) 5 MG tablet Take 5 mg by mouth daily.    [provider]  aspirin EC 81 MG tablet Take 81 mg by mouth daily.    [provider]  baclofen (LIORESAL) 10 MG tablet Take 10 mg by mouth at bedtime.    [provider]  cloNIDine (CATAPRES) 0.1 MG tablet Take 1 tablet (0.1 mg total) by mouth daily. 06/24/20   Harvie Heck, MD  clopidogrel (PLAVIX) 75 MG tablet Take 75 mg by mouth daily.    [provider]  diclofenac Sodium (VOLTAREN) 1 % GEL Apply 2 g topically 4 (four) times daily. 06/23/20   Harvie Heck, MD  enalapril (VASOTEC) 20 MG tablet Take 20  mg by mouth at bedtime.    [provider]  EPINEPHrine (EPI-PEN) 0.3 mg/0.3 mL DEVI Inject 0.3 mg into the muscle once as needed (for anaphylactic reaction).     [provider]  gabapentin (NEURONTIN) 300 MG capsule Take 300-600 mg by mouth See admin instructions. Take 300 mg by mouth in the morning then 300 mg at noon then 600 mg at bedtime    [provider]  HYDROcodone-acetaminophen (NORCO) 10-325 MG tablet Take 1 tablet by mouth 2 (two) times daily.    [provider]  Ipratropium-Albuterol (COMBIVENT) 20-100 MCG/ACT AERS respimat Inhale 1 puff into the lungs 4 (four) times daily.    [provider]  levothyroxine (SYNTHROID) 75 MCG tablet Take 1 tablet (75 mcg total) by mouth daily at 6 (six) AM. 06/24/20   Aslam, Loralyn Freshwater, MD  Melatonin 3 MG TABS Take 6 mg by mouth at bedtime.    [provider]  metoprolol tartrate (LOPRESSOR) 25 MG tablet Take 25 mg by mouth daily.    [provider]  mometasone (ASMANEX) 220 MCG/INH inhaler Inhale 1 puff into the  lungs at bedtime.    [provider]  Multiple Vitamin (MULTIVITAMIN) tablet Take 1 tablet by mouth daily.    [provider]  nitroGLYCERIN (NITROSTAT) 0.4 MG SL tablet Place 0.4 mg under the tongue every 5 (five) minutes as needed for chest pain.    [provider]  ondansetron (ZOFRAN) 4 MG tablet Take 1 tablet (4 mg total) by mouth every 6 (six) hours as needed for nausea. 06/23/20   Harvie Heck, MD  pantoprazole (PROTONIX) 40 MG tablet Take 1 tablet (40 mg total) by mouth 2 (two) times daily. 06/23/20   Harvie Heck, MD  polyethylene glycol (MIRALAX / GLYCOLAX) packet Take 17 g by mouth daily. Patient taking differently: Take 17 g by mouth daily as needed for mild constipation or moderate constipation. 08/22/15   Lavina Hamman, MD  pravastatin (PRAVACHOL) 40 MG tablet Take 40 mg by mouth at bedtime.    [provider]    Allergies    Bee venom and Influenza vaccines  Review of Systems   Review of Systems  Constitutional: Negative for chills and fever.  HENT: Negative for ear pain and sore throat.   Eyes: Negative for pain and visual disturbance.  Respiratory: Negative for cough and shortness of breath.   Cardiovascular: Positive for chest pain. Negative for palpitations.  Gastrointestinal: Negative for abdominal pain and vomiting.  Genitourinary: Negative for dysuria and hematuria.  Musculoskeletal: Negative for arthralgias and back pain.  Skin: Negative for color change and rash.  Neurological: Negative for seizures and syncope.  All other systems reviewed and are negative.   Physical Exam Updated Vital Signs BP (!) 162/66   Pulse 63   Temp 97.9 F (36.6 C) (Oral)   Resp 10   Ht 6\' 2"  (1.88 m)   Wt 93.9 kg   SpO2 94%   BMI 26.58 kg/m   Physical Exam Vitals and nursing note reviewed.  Constitutional:      Appearance: He is well-developed.  HENT:     Head: Normocephalic and atraumatic.  Eyes:     Conjunctiva/sclera: Conjunctivae  normal.  Cardiovascular:     Rate and Rhythm: Normal rate and regular rhythm.     Heart sounds: No murmur heard.   Pulmonary:     Effort: Pulmonary effort is normal. No respiratory distress.     Breath sounds: Normal breath sounds.  Abdominal:     Palpations: Abdomen is soft.     Tenderness: There is no abdominal tenderness.  Musculoskeletal:     Cervical back: Neck supple.  Skin:    General: Skin is warm and dry.  Neurological:     Mental Status: He is alert.     ED Results / Procedures / Treatments   Labs (all labs ordered are listed, but only abnormal results are displayed) Labs Reviewed  CBC WITH DIFFERENTIAL/PLATELET - Abnormal; Notable for the following components:      Result Value   RBC 3.29 (*)    Hemoglobin 9.8 (*)    HCT 31.5 (*)    All other components within normal limits  BASIC METABOLIC PANEL - Abnormal; Notable for the following components:   Glucose, Bld 175 (*)    Creatinine, Ser 1.87 (*)    Calcium 8.5 (*)    GFR, Estimated 35 (*)    All other components within normal limits  TROPONIN I (HIGH SENSITIVITY)  TROPONIN I (HIGH SENSITIVITY)    EKG EKG Interpretation  Date/Time:  Monday Jul 08 2020 09:38:34 EDT Ventricular Rate:  67 PR Interval:  172 QRS Duration: 99 QT Interval:  413 QTC Calculation: 436 R Axis:   77 Text Interpretation: Sinus rhythm Anterior infarct, old Confirmed by Madalyn Rob [phone]) on 07/08/2020 10:01:19 AM   Radiology DG Chest 2 View  Result Date: 07/08/2020 CLINICAL DATA:  Chest pain and back pain EXAM: CHEST - 2 VIEW COMPARISON:  06/23/2020 FINDINGS: Normal heart size and mediastinal contours. Hyperinflation with diaphragm flattening and interstitial coarsening at the bases. There is no edema, consolidation, effusion, or pneumothorax. IMPRESSION: COPD without acute superimposed finding. Electronically Signed   By: Monte Fantasia M.D.   On: 07/08/2020 10:39    Procedures Procedures   Medications Ordered in  ED Medications  nitroGLYCERIN (NITROSTAT) SL tablet 0.4 mg (0.4 mg Sublingual Given 07/08/20 1058)  fentaNYL (SUBLIMAZE) injection 50 mcg (50 mcg Intravenous Given 07/08/20 1028)  nitroGLYCERIN (NITROSTAT) SL tablet 0.4 mg (0.4 mg Sublingual Given 07/08/20 1104)    ED Course  I have reviewed the triage vital signs and the nursing notes.  Pertinent labs & imaging results that were available during my care of the patient were reviewed by me and considered in my medical decision making (see chart for details).  Clinical Course as of 07/08/20 1632  Mon Jul 08, 2020  1108 Pain improved after nitroglycerin [RD]  1155 D/w Wannetta Sender - cards will see [RD]    Clinical Course User Index [RD] Lucrezia Starch, MD   MDM Rules/Calculators/A&P HEAR Score: 52                       86 year old gentleman with extensive past medical history including history of coronary artery disease presenting to ER with concern for chest pain.  On exam, he was well-appearing in no distress, vital stable.  EKG without obvious ischemic change and troponin within normal limits.  Chest x-ray within normal limits.  Remainder basic labs stable.  Symptoms improved after receiving nitroglycerin.  Given his extensive coronary history, consult to cardiology.  While awaiting cardiology evaluation, signed out to Dr. Francia Greaves.  Final Clinical Impression(s) / ED Diagnoses Final diagnoses:  Chest pain, unspecified type    Rx / DC Orders ED Discharge Orders    None       Lucrezia Starch, MD 07/08/20 [phone]

## 2020-07-08 NOTE — ED Triage Notes (Signed)
Pt brought to ED via EMS from home with c/o central CP radiating to back. Pt describes pain as a burning sensation; denies headache, n/v. 4 SL nitro and 324 mg asp given PTA with no improvement. Pain decreased from 10/10 to 4/10 with 4 mg morphine given en route. Patient alert and oriented x 4 in ED, rates pain 4/10.  EMS v/s: 139/69 71 pulse 97% room air 207 cbg

## 2020-07-09 ENCOUNTER — Encounter (HOSPITAL_COMMUNITY)
Admission: EM | Disposition: A | Discharge status: Home / Self Care | Payer: Self-pay | Source: Home / Self Care | Attending: Emergency Medicine

## 2020-07-09 ENCOUNTER — Encounter (HOSPITAL_COMMUNITY): Payer: Self-pay | Admitting: Cardiology

## 2020-07-09 DIAGNOSIS — R400 Somnolence: Secondary | ICD-10-CM

## 2020-07-09 DIAGNOSIS — R079 Chest pain, unspecified: Secondary | ICD-10-CM | POA: Diagnosis not present

## 2020-07-09 DIAGNOSIS — I25110 Atherosclerotic heart disease of native coronary artery with unstable angina pectoris: Secondary | ICD-10-CM

## 2020-07-09 HISTORY — PX: LEFT HEART CATH AND CORONARY ANGIOGRAPHY: CATH118249

## 2020-07-09 LAB — CBC
HCT: 39.8 % (ref 39.0–52.0)
Hemoglobin: 12.7 g/dL — ABNORMAL LOW (ref 13.0–17.0)
MCH: 29.9 pg (ref 26.0–34.0)
MCHC: 31.9 g/dL (ref 30.0–36.0)
MCV: 93.6 fL (ref 80.0–100.0)
Platelets: 280 10*3/uL (ref 150–400)
RBC: 4.25 MIL/uL (ref 4.22–5.81)
RDW: 14.1 % (ref 11.5–15.5)
WBC: 7.4 10*3/uL (ref 4.0–10.5)
nRBC: 0 % (ref 0.0–0.2)

## 2020-07-09 LAB — BASIC METABOLIC PANEL
Anion gap: 6 (ref 5–15)
BUN: 14 mg/dL (ref 8–23)
CO2: 28 mmol/L (ref 22–32)
Calcium: 8.6 mg/dL — ABNORMAL LOW (ref 8.9–10.3)
Chloride: 103 mmol/L (ref 98–111)
Creatinine, Ser: 1.9 mg/dL — ABNORMAL HIGH (ref 0.61–1.24)
GFR, Estimated: 35 mL/min — ABNORMAL LOW (ref >60)
Glucose, Bld: 101 mg/dL — ABNORMAL HIGH (ref 70–99)
Potassium: 4.1 mmol/L (ref 3.5–5.1)
Sodium: 137 mmol/L (ref 135–145)

## 2020-07-09 LAB — MRSA PCR SCREENING: MRSA by PCR: POSITIVE — AB

## 2020-07-09 LAB — LIPID PANEL
Cholesterol: 84 mg/dL (ref 0–200)
HDL: 22 mg/dL — ABNORMAL LOW (ref >40)
LDL Cholesterol: 38 mg/dL (ref 0–99)
Total CHOL/HDL Ratio: 3.8 RATIO
Triglycerides: 119 mg/dL (ref <150)
VLDL: 24 mg/dL (ref 0–40)

## 2020-07-09 LAB — HEMOGLOBIN A1C
Hgb A1c MFr Bld: 7.3 % — ABNORMAL HIGH (ref 4.8–5.6)
Mean Plasma Glucose: 162.81 mg/dL

## 2020-07-09 LAB — PROTIME-INR
INR: 1.2 (ref 0.8–1.2)
Prothrombin Time: 15.1 seconds (ref 11.4–15.2)

## 2020-07-09 LAB — SARS CORONAVIRUS 2 (TAT 6-24 HRS): SARS Coronavirus 2: NEGATIVE

## 2020-07-09 SURGERY — LEFT HEART CATH AND CORONARY ANGIOGRAPHY
Anesthesia: LOCAL

## 2020-07-09 MED ORDER — CHLORHEXIDINE GLUCONATE CLOTH 2 % EX PADS
6.0000 | MEDICATED_PAD | Freq: Every day | CUTANEOUS | Status: DC
  Administered 2020-07-09: 6 via TOPICAL

## 2020-07-09 MED ORDER — LABETALOL HCL 5 MG/ML IV SOLN
INTRAVENOUS | Status: AC
  Filled 2020-07-09: qty 4

## 2020-07-09 MED ORDER — HEPARIN SODIUM (PORCINE) 1000 UNIT/ML IJ SOLN
INTRAMUSCULAR | Status: AC
  Filled 2020-07-09: qty 1

## 2020-07-09 MED ORDER — SODIUM CHLORIDE 0.9 % WEIGHT BASED INFUSION: 1.0000 mL/kg/h | INTRAVENOUS | Status: AC

## 2020-07-09 MED ORDER — NITROGLYCERIN 0.4 MG SL SUBL
SUBLINGUAL_TABLET | SUBLINGUAL | Status: AC
  Filled 2020-07-09: qty 1

## 2020-07-09 MED ORDER — LABETALOL HCL 5 MG/ML IV SOLN
INTRAVENOUS | Status: DC | PRN
  Administered 2020-07-09: 10 mg via INTRAVENOUS

## 2020-07-09 MED ORDER — SODIUM CHLORIDE 0.9 % IV SOLN
250.0000 mL | INTRAVENOUS | Status: DC | PRN
Start: 2020-07-09 — End: 2020-07-10

## 2020-07-09 MED ORDER — HEPARIN (PORCINE) IN NACL 1000-0.9 UT/500ML-% IV SOLN
INTRAVENOUS | Status: AC
  Filled 2020-07-09: qty 1500

## 2020-07-09 MED ORDER — VERAPAMIL HCL 2.5 MG/ML IV SOLN
INTRAVENOUS | Status: DC | PRN
  Administered 2020-07-09: 10 mL via INTRA_ARTERIAL

## 2020-07-09 MED ORDER — HEPARIN SODIUM (PORCINE) 1000 UNIT/ML IJ SOLN
INTRAMUSCULAR | Status: DC | PRN
  Administered 2020-07-09: 4500 [IU] via INTRAVENOUS

## 2020-07-09 MED ORDER — HEPARIN (PORCINE) IN NACL 1000-0.9 UT/500ML-% IV SOLN
INTRAVENOUS | Status: DC | PRN
  Administered 2020-07-09 (×2): 500 mL

## 2020-07-09 MED ORDER — NITROGLYCERIN 0.4 MG SL SUBL
SUBLINGUAL_TABLET | SUBLINGUAL | Status: DC | PRN
  Administered 2020-07-09: .4 mg via SUBLINGUAL

## 2020-07-09 MED ORDER — FENTANYL CITRATE (PF) 100 MCG/2ML IJ SOLN
INTRAMUSCULAR | Status: DC | PRN
  Administered 2020-07-09 (×2): 25 ug via INTRAVENOUS

## 2020-07-09 MED ORDER — MUPIROCIN 2 % EX OINT
1.0000 "application " | TOPICAL_OINTMENT | Freq: Two times a day (BID) | CUTANEOUS | Status: DC
  Administered 2020-07-09 – 2020-07-10 (×3): 1 via NASAL
  Filled 2020-07-09: qty 22

## 2020-07-09 MED ORDER — SODIUM CHLORIDE 0.9 % IV SOLN: 250.0000 mL | INTRAVENOUS | Status: DC | PRN

## 2020-07-09 MED ORDER — MIDAZOLAM HCL 2 MG/2ML IJ SOLN
INTRAMUSCULAR | Status: DC | PRN
  Administered 2020-07-09: 0.5 mg via INTRAVENOUS

## 2020-07-09 MED ORDER — SODIUM CHLORIDE 0.9 % IV SOLN: INTRAVENOUS | Status: DC

## 2020-07-09 MED ORDER — VERAPAMIL HCL 2.5 MG/ML IV SOLN
INTRAVENOUS | Status: AC
  Filled 2020-07-09: qty 2

## 2020-07-09 MED ORDER — LIDOCAINE HCL (PF) 1 % IJ SOLN
INTRAMUSCULAR | Status: AC
  Filled 2020-07-09: qty 30

## 2020-07-09 MED ORDER — LABETALOL HCL 5 MG/ML IV SOLN: 10.0000 mg | INTRAVENOUS | Status: AC | PRN

## 2020-07-09 MED ORDER — ASPIRIN 81 MG PO CHEW: 81.0000 mg | CHEWABLE_TABLET | ORAL | Status: DC

## 2020-07-09 MED ORDER — SODIUM CHLORIDE 0.9% FLUSH
3.0000 mL | Freq: Two times a day (BID) | INTRAVENOUS | Status: DC
  Administered 2020-07-10: 3 mL via INTRAVENOUS

## 2020-07-09 MED ORDER — FENTANYL CITRATE (PF) 100 MCG/2ML IJ SOLN
INTRAMUSCULAR | Status: AC
  Filled 2020-07-09: qty 2

## 2020-07-09 MED ORDER — HYDRALAZINE HCL 20 MG/ML IJ SOLN: 10.0000 mg | INTRAMUSCULAR | Status: AC | PRN

## 2020-07-09 MED ORDER — SODIUM CHLORIDE 0.9% FLUSH
3.0000 mL | INTRAVENOUS | Status: DC | PRN
Start: 2020-07-09 — End: 2020-07-10

## 2020-07-09 MED ORDER — IOHEXOL 350 MG/ML SOLN
INTRAVENOUS | Status: DC | PRN
  Administered 2020-07-09: 45 mL

## 2020-07-09 MED ORDER — SODIUM CHLORIDE 0.9% FLUSH: 3.0000 mL | INTRAVENOUS | Status: DC | PRN

## 2020-07-09 MED ORDER — LIDOCAINE HCL (PF) 1 % IJ SOLN
INTRAMUSCULAR | Status: DC | PRN
  Administered 2020-07-09: 3 mL

## 2020-07-09 MED ORDER — ASPIRIN 81 MG PO CHEW
81.0000 mg | CHEWABLE_TABLET | ORAL | Status: AC
  Administered 2020-07-09: 81 mg via ORAL
  Filled 2020-07-09: qty 1

## 2020-07-09 MED ORDER — MIDAZOLAM HCL 2 MG/2ML IJ SOLN
INTRAMUSCULAR | Status: AC
  Filled 2020-07-09: qty 2

## 2020-07-09 SURGICAL SUPPLY — 10 items
CATH OPTITORQUE TIG 4.0 5F (CATHETERS) ×2 IMPLANT
DEVICE RAD COMP TR BAND LRG (VASCULAR PRODUCTS) ×2 IMPLANT
GLIDESHEATH SLEND SS 6F .021 (SHEATH) ×2 IMPLANT
GUIDEWIRE INQWIRE 1.5J.035X260 (WIRE) ×1 IMPLANT
INQWIRE 1.5J .035X260CM (WIRE) ×2
KIT HEART LEFT (KITS) ×2 IMPLANT
PACK CARDIAC CATHETERIZATION (CUSTOM PROCEDURE TRAY) ×2 IMPLANT
SHEATH PROBE COVER 6X72 (BAG) ×2 IMPLANT
TRANSDUCER W/STOPCOCK (MISCELLANEOUS) ×2 IMPLANT
TUBING CIL FLEX 10 FLL-RA (TUBING) ×2 IMPLANT

## 2020-07-09 NOTE — Progress Notes (Addendum)
Progress Note  Patient Name: Wayne Meadows Date of Encounter: 07/09/2020  [facility] Cardiologist: None   Subjective   No chest pain overnight, no shortness of breath, somnolent this morning  Inpatient Medications    Scheduled Meds: . amLODipine  5 mg Oral Daily  . aspirin EC  81 mg Oral Daily  . baclofen  10 mg Oral QHS  . budesonide (PULMICORT) nebulizer solution  0.25 mg Nebulization BID  . Chlorhexidine Gluconate Cloth  6 each Topical Q0600  . cloNIDine  0.1 mg Oral Daily  . clopidogrel  75 mg Oral Daily  . gabapentin  300 mg Oral BID  . gabapentin  600 mg Oral QHS  . heparin  5,000 Units Subcutaneous Q8H  . levothyroxine  50 mcg Oral Q0600  . melatonin  6 mg Oral QHS  . metoprolol tartrate  25 mg Oral Daily  . mupirocin ointment  1 application Nasal BID  . pantoprazole  40 mg Oral BID  . pravastatin  40 mg Oral QHS  . sodium chloride flush  3 mL Intravenous Q12H   Continuous Infusions: . sodium chloride    . sodium chloride 75 mL/hr at 07/09/20 0622   PRN Meds: sodium chloride, acetaminophen, albuterol, HYDROcodone-acetaminophen, nitroGLYCERIN, ondansetron, polyethylene glycol, sodium chloride flush   Vital Signs    Vitals:   07/09/20 0255 07/09/20 0741 07/09/20 0841 07/09/20 0842  BP: (!) 166/70 140/66    Pulse: 82 75    Resp: 18 14    Temp: 98.8 F (37.1 C) 98.7 F (37.1 C)    TempSrc: Oral Oral    SpO2: 92%  92% 95%  Weight: 89 kg     Height: 6\' 2"  (1.88 m)      No intake or output data in the 24 hours ending 07/09/20 0906 Last 3 Weights 07/09/2020 07/08/2020 06/20/2020  Weight (lbs) 196 lb 3.4 oz 207 lb 197 lb 5 oz  Weight (kg) 89 kg 93.895 kg 89.5 kg      Telemetry    Normal sinus rhythm without arrhythmia - Personally Reviewed  Physical Exam  Somnolent but arousable, responds to questions appropriately GEN: No acute distress.   Neck: No JVD Cardiac: RRR, no murmurs, rubs, or gallops.  Respiratory: Clear to auscultation  bilaterally. GI: Soft, nontender, non-distended  MS: No edema; No deformity. Neuro:   Somnolent but nonfocal.  Moves all extremities to command, grip strength and plantar extension 5/5 bilateral Psych: Somnolent  Labs    High Sensitivity Troponin:   Recent Labs  Lab 07/08/20 0935 07/08/20 1147  TROPONINIHS 5 4      Chemistry Recent Labs  Lab 07/08/20 0935 07/09/20 0500  NA 137 137  K 4.5 4.1  CL 102 103  CO2 28 28  GLUCOSE 175* 101*  BUN 16 14  CREATININE 1.87* 1.90*  CALCIUM 8.5* 8.6*  GFRNONAA 35* 35*  ANIONGAP 7 6     Hematology Recent Labs  Lab 07/08/20 0935 07/09/20 0500  WBC 9.3 7.4  RBC 3.29* 4.25  HGB 9.8* 12.7*  HCT 31.5* 39.8  MCV 95.7 93.6  MCH 29.8 29.9  MCHC 31.1 31.9  RDW 14.1 14.1  PLT 358 280    BNPNo results for input(s): BNP, PROBNP in the last 168 hours.   DDimer No results for input(s): DDIMER in the last 168 hours.   Radiology    DG Chest 2 View  Result Date: 07/08/2020 CLINICAL DATA:  Chest pain and back pain EXAM: CHEST - 2  VIEW COMPARISON:  06/23/2020 FINDINGS: Normal heart size and mediastinal contours. Hyperinflation with diaphragm flattening and interstitial coarsening at the bases. There is no edema, consolidation, effusion, or pneumothorax. IMPRESSION: COPD without acute superimposed finding. Electronically Signed   By: Monte Fantasia M.D.   On: 07/08/2020 10:39    Cardiac Studies   Pending  Patient Profile     86 y.o. male with a hx of CAD s/p PCI, hypertension, hyperlipidemia, CVA in 2009, COPD, GERD, CKD stage III, bilateral renal lesions,  who is being seen 07/08/2020 for the evaluation of chest pain   Assessment & Plan    1.  Chest pain: Patient with remote history of multiple PCI procedures, has been clinically stable for several years, now presents with recurrent chest discomfort.  Cardiac markers are negative and EKG has no ischemic changes.  Had planned for cardiac catheterization today.  However, the patient  appears sedated this morning.  He is having trouble with verbal expression.  I have reviewed his medications and I wonder if this is related to his nighttime dose of gabapentin that was administered last night.  This was on his home medicine list, and he received 600 mg.  I do not see any other anxiolytic medications or narcotics given overnight.  We will cancel heart catheterization this morning and consider delaying his catheterization until tomorrow versus noninvasive assessment tomorrow.  The patient has had no recurrent chest discomfort overnight and he is resting comfortably this morning.  2.  Hypertension: Blood pressure currently controlled on current medicines.  3.  Mixed hyperlipidemia: Patient treated with pravastatin.  LDL cholesterol is 38.  4.  Chronic kidney disease stage IIIb: Creatinine stable after fluids overnight.  ACE inhibitor on hold pending cardiac catheterization.  5.  Altered mental status: The patient is able to tell me his name, age, and address.  He is somnolent this morning and I suspect this is medication related as outlined above.  His neurologic exam is otherwise completely nonfocal.  Hold gabapentin and all narcotic analgesics.  Continue to follow.  Hold on cardiac testing today.  For questions or updates, please contact [place] Please consult [email or web address] for contact info under     Signed, Sherren Mocha, MD  07/09/2020, 9:06 AM    Addendum: The patient is reevaluated.  He is now wide-awake and mentating normally.  He tells me that he was just very somnolent this morning because he did not sleep at all overnight.  He is answering all questions appropriately.  We discussed cardiac catheterization again and he would like to move forward with this today.  He is still n.p.o. and has received IV fluids overnight.  We will proceed with cardiac catheterization using limited contrast today in light of his stage IIIb chronic kidney disease.  If he requires PCI, it  will likely need to be staged.  Sherren Mocha 07/09/2020 10:14 AM

## 2020-07-09 NOTE — Brief Op Note (Addendum)
   BRIEF CARDIAC CATH NOTE  07/09/2020  11:39 AM   PROCEDURE:  Procedure(s): LEFT HEART CATH AND CORONARY ANGIOGRAPHY (N/A)  SURGEON:  Surgeon(s) and Role:    * Leonie Man, MD - Primary  PATIENT:  Wayne Meadows  86 y.o. male with distant history of BMS to RCA as well as stent to the LCx.  Most recent catheterization was in 2011 by Dr. Aundra Dubin.  Stents were patent with 50% mid LAD lesion.  Small  first diagonal was noted to be occluded.  He presents with symptoms concerning for unstable angina-symptoms similar to previous stents.  He is now referred for diagnostic catheterization.  Plan is minimal contrast based on his creatinine of 1.9.  If necessary would be staged PCI.  PRE-OPERATIVE DIAGNOSIS: Unstable angina  POST-OPERATIVE DIAGNOSIS:    Stable three-vessel disease with widely patent stent in the proximal Ramus Intermedius, moderate 40 to 50% in-stent restenosis in the proximal RCA BMS, focal 50% mid LAD just prior to a bend (the previously noted subtotaled first diagonal branch is now fully occluded).  Severe Systemic Hypertension with moderately elevated LVEDP.  (LV gram not performed because of contrast conservation)  Time Out: Verified patient identification, verified procedure, site/side was marked, verified correct patient position, special equipment/implants available, medications/allergies/relevent history reviewed, required imaging and test results available. Performed.  Access:  . RIGHT Radial Artery: 6 Fr sheath -- Seldinger technique using Micropuncture Kit -- Direct ultrasound guidance used.  Permanent image obtained and placed on chart. -- 10 mL radial cocktail IA; 4500 Units IV Heparin  Left Heart Catheterization: 5 Fr TIG 4.0 Catheter was advanced or exchanged over a J-wire under direct fluoroscopic guidance into the ascending aorta; the catheter was advanced across the aortic valve first for hemodynamics.  After pullback across aortic valve, the catheter was  then used to engage first the Left Coronary Artery followed by the Right Coronary Artery for selective coronary cineangiography.  Review of initial angiography revealed: Moderate three-vessel disease: Widely patent ramus intermedius stent, for stable 50% mid LAD lesion with known occlusion of D1, 40 to 50% ISR in RCA BMS.  Preparations are made for optimization medical management.  He was given 10 mg IV labetalol for hypertension along with additional fentanyl for backslash chest pain.  Upon completion of Angiogaphy, the catheter was removed completely out of the body over a wire, without complication.  Radial sheath removed in the Cardiac Catheterization lab with TR Band placed for hemostasis.  TR Band: 1130 Hours; 16 mL air  MEDICATIONS . SQ Lidocaine 3 mL . Radial Cocktail: 3 mg Verapmil in 10 mL NS . Heparin: 4500 Units . 10 mg IV Labetalol   ANESTHESIA:   local and IV sedation; 0.5 mg Versed, 50 mcg fentanyl, 3 mL local lidocaine  EBL:  <20 mL  COUNTS:  n/a  DICTATION: .Note written in EPIC  PLAN OF CARE: Patient will require additional blood pressure monitoring and IV fluid resuscitation after cardiac catheterization.  We will determine discharge plans based on primary team discussion.  PATIENT DISPOSITION:  PACU - hemodynamically stable.   Delay start of Pharmacological VTE agent (>24hrs) due to surgical blood loss or risk of bleeding: not applicable.   Glenetta Hew, MD

## 2020-07-09 NOTE — H&P (View-Only) (Signed)
Progress Note  Patient Name: Wayne Meadows Date of Encounter: 07/09/2020  [facility] Cardiologist: None   Subjective   No chest pain overnight, no shortness of breath, somnolent this morning  Inpatient Medications    Scheduled Meds: . amLODipine  5 mg Oral Daily  . aspirin EC  81 mg Oral Daily  . baclofen  10 mg Oral QHS  . budesonide (PULMICORT) nebulizer solution  0.25 mg Nebulization BID  . Chlorhexidine Gluconate Cloth  6 each Topical Q0600  . cloNIDine  0.1 mg Oral Daily  . clopidogrel  75 mg Oral Daily  . gabapentin  300 mg Oral BID  . gabapentin  600 mg Oral QHS  . heparin  5,000 Units Subcutaneous Q8H  . levothyroxine  50 mcg Oral Q0600  . melatonin  6 mg Oral QHS  . metoprolol tartrate  25 mg Oral Daily  . mupirocin ointment  1 application Nasal BID  . pantoprazole  40 mg Oral BID  . pravastatin  40 mg Oral QHS  . sodium chloride flush  3 mL Intravenous Q12H   Continuous Infusions: . sodium chloride    . sodium chloride 75 mL/hr at 07/09/20 0622   PRN Meds: sodium chloride, acetaminophen, albuterol, HYDROcodone-acetaminophen, nitroGLYCERIN, ondansetron, polyethylene glycol, sodium chloride flush   Vital Signs    Vitals:   07/09/20 0255 07/09/20 0741 07/09/20 0841 07/09/20 0842  BP: (!) 166/70 140/66    Pulse: 82 75    Resp: 18 14    Temp: 98.8 F (37.1 C) 98.7 F (37.1 C)    TempSrc: Oral Oral    SpO2: 92%  92% 95%  Weight: 89 kg     Height: 6\' 2"  (1.88 m)      No intake or output data in the 24 hours ending 07/09/20 0906 Last 3 Weights 07/09/2020 07/08/2020 06/20/2020  Weight (lbs) 196 lb 3.4 oz 207 lb 197 lb 5 oz  Weight (kg) 89 kg 93.895 kg 89.5 kg      Telemetry    Normal sinus rhythm without arrhythmia - Personally Reviewed  Physical Exam  Somnolent but arousable, responds to questions appropriately GEN: No acute distress.   Neck: No JVD Cardiac: RRR, no murmurs, rubs, or gallops.  Respiratory: Clear to auscultation  bilaterally. GI: Soft, nontender, non-distended  MS: No edema; No deformity. Neuro:   Somnolent but nonfocal.  Moves all extremities to command, grip strength and plantar extension 5/5 bilateral Psych: Somnolent  Labs    High Sensitivity Troponin:   Recent Labs  Lab 07/08/20 0935 07/08/20 1147  TROPONINIHS 5 4      Chemistry Recent Labs  Lab 07/08/20 0935 07/09/20 0500  NA 137 137  K 4.5 4.1  CL 102 103  CO2 28 28  GLUCOSE 175* 101*  BUN 16 14  CREATININE 1.87* 1.90*  CALCIUM 8.5* 8.6*  GFRNONAA 35* 35*  ANIONGAP 7 6     Hematology Recent Labs  Lab 07/08/20 0935 07/09/20 0500  WBC 9.3 7.4  RBC 3.29* 4.25  HGB 9.8* 12.7*  HCT 31.5* 39.8  MCV 95.7 93.6  MCH 29.8 29.9  MCHC 31.1 31.9  RDW 14.1 14.1  PLT 358 280    BNPNo results for input(s): BNP, PROBNP in the last 168 hours.   DDimer No results for input(s): DDIMER in the last 168 hours.   Radiology    DG Chest 2 View  Result Date: 07/08/2020 CLINICAL DATA:  Chest pain and back pain EXAM: CHEST - 2  VIEW COMPARISON:  06/23/2020 FINDINGS: Normal heart size and mediastinal contours. Hyperinflation with diaphragm flattening and interstitial coarsening at the bases. There is no edema, consolidation, effusion, or pneumothorax. IMPRESSION: COPD without acute superimposed finding. Electronically Signed   By: Monte Fantasia M.D.   On: 07/08/2020 10:39    Cardiac Studies   Pending  Patient Profile     86 y.o. male with a hx of CAD s/p PCI, hypertension, hyperlipidemia, CVA in 2009, COPD, GERD, CKD stage III, bilateral renal lesions,  who is being seen 07/08/2020 for the evaluation of chest pain   Assessment & Plan    1.  Chest pain: Patient with remote history of multiple PCI procedures, has been clinically stable for several years, now presents with recurrent chest discomfort.  Cardiac markers are negative and EKG has no ischemic changes.  Had planned for cardiac catheterization today.  However, the patient  appears sedated this morning.  He is having trouble with verbal expression.  I have reviewed his medications and I wonder if this is related to his nighttime dose of gabapentin that was administered last night.  This was on his home medicine list, and he received 600 mg.  I do not see any other anxiolytic medications or narcotics given overnight.  We will cancel heart catheterization this morning and consider delaying his catheterization until tomorrow versus noninvasive assessment tomorrow.  The patient has had no recurrent chest discomfort overnight and he is resting comfortably this morning.  2.  Hypertension: Blood pressure currently controlled on current medicines.  3.  Mixed hyperlipidemia: Patient treated with pravastatin.  LDL cholesterol is 38.  4.  Chronic kidney disease stage IIIb: Creatinine stable after fluids overnight.  ACE inhibitor on hold pending cardiac catheterization.  5.  Altered mental status: The patient is able to tell me his name, age, and address.  He is somnolent this morning and I suspect this is medication related as outlined above.  His neurologic exam is otherwise completely nonfocal.  Hold gabapentin and all narcotic analgesics.  Continue to follow.  Hold on cardiac testing today.  For questions or updates, please contact [facility] Please consult [email or web address] for contact info under     Signed, Sherren Mocha, MD  07/09/2020, 9:06 AM    Addendum: The patient is reevaluated.  He is now wide-awake and mentating normally.  He tells me that he was just very somnolent this morning because he did not sleep at all overnight.  He is answering all questions appropriately.  We discussed cardiac catheterization again and he would like to move forward with this today.  He is still n.p.o. and has received IV fluids overnight.  We will proceed with cardiac catheterization using limited contrast today in light of his stage IIIb chronic kidney disease.  If he requires PCI, it  will likely need to be staged.  Sherren Mocha 07/09/2020 10:14 AM

## 2020-07-09 NOTE — Plan of Care (Signed)

## 2020-07-09 NOTE — Interval H&P Note (Signed)
History and Physical Interval Note:  07/09/2020 11:02 AM  Wayne Meadows  has presented today for surgery, with the diagnosis of nstemi.  The various methods of treatment have been discussed with the patient and family. After consideration of risks, benefits and other options for treatment, the patient has consented to  Procedure(s): LEFT HEART CATH AND CORONARY ANGIOGRAPHY (N/A)   as a surgical intervention.  The patient's history has been reviewed, patient examined, no change in status, stable for surgery.  I have reviewed the patient's chart and labs.  Questions were answered to the patient's satisfaction.     Glenetta Hew

## 2020-07-10 DIAGNOSIS — R072 Precordial pain: Secondary | ICD-10-CM | POA: Diagnosis not present

## 2020-07-10 DIAGNOSIS — I2582 Chronic total occlusion of coronary artery: Secondary | ICD-10-CM | POA: Diagnosis not present

## 2020-07-10 DIAGNOSIS — I2510 Atherosclerotic heart disease of native coronary artery without angina pectoris: Secondary | ICD-10-CM | POA: Diagnosis not present

## 2020-07-10 DIAGNOSIS — E039 Hypothyroidism, unspecified: Secondary | ICD-10-CM | POA: Diagnosis not present

## 2020-07-10 DIAGNOSIS — Z20822 Contact with and (suspected) exposure to covid-19: Secondary | ICD-10-CM | POA: Diagnosis not present

## 2020-07-10 LAB — BASIC METABOLIC PANEL
Anion gap: 5 (ref 5–15)
BUN: 11 mg/dL (ref 8–23)
CO2: 25 mmol/L (ref 22–32)
Calcium: 8.6 mg/dL — ABNORMAL LOW (ref 8.9–10.3)
Chloride: 110 mmol/L (ref 98–111)
Creatinine, Ser: 1.77 mg/dL — ABNORMAL HIGH (ref 0.61–1.24)
GFR, Estimated: 38 mL/min — ABNORMAL LOW (ref >60)
Glucose, Bld: 102 mg/dL — ABNORMAL HIGH (ref 70–99)
Potassium: 4.3 mmol/L (ref 3.5–5.1)
Sodium: 140 mmol/L (ref 135–145)

## 2020-07-10 MED ORDER — AMLODIPINE BESYLATE 10 MG PO TABS: 10.0000 mg | ORAL_TABLET | Freq: Every day | ORAL | 1 refills | Status: DC

## 2020-07-10 MED ORDER — AMLODIPINE BESYLATE 10 MG PO TABS: 10.0000 mg | ORAL_TABLET | Freq: Every day | ORAL | Status: DC

## 2020-07-10 MED ORDER — ENALAPRIL MALEATE 5 MG PO TABS
20.0000 mg | ORAL_TABLET | Freq: Every day | ORAL | Status: DC
  Filled 2020-07-10: qty 1

## 2020-07-10 MED ORDER — HYDROCODONE-ACETAMINOPHEN 10-325 MG PO TABS
1.0000 | ORAL_TABLET | Freq: Two times a day (BID) | ORAL | Status: DC | PRN
  Administered 2020-07-10: 1 via ORAL
  Filled 2020-07-10: qty 1

## 2020-07-10 MED ORDER — ENALAPRIL MALEATE 20 MG PO TABS
20.0000 mg | ORAL_TABLET | Freq: Every day | ORAL | Status: DC
  Filled 2020-07-10: qty 1

## 2020-07-10 NOTE — TOC Progression Note (Signed)
Transition of Care [facility]) - Progression Note    Patient Details  Name: Wayne Meadows MRN: [id number] Date of Birth: [birth year]  Transition of Care [facility]) CM/SW [place], [place] Phone Number: 07/10/2020, 12:46 PM  Clinical Narrative:     CSW arranged [facility] with [facility] to take pt home.        Expected Discharge Plan and Services           Expected Discharge Date: 07/10/20                                     Social Determinants of Health (SDOH) Interventions    Readmission Risk Interventions No flowsheet data found.

## 2020-07-10 NOTE — Plan of Care (Signed)

## 2020-07-10 NOTE — Discharge Summary (Addendum)
Discharge Summary    Patient ID: Wayne Meadows MRN: [id number]; DOB: Oct 22, 1934  Admit date: 07/08/2020 Discharge date: 07/10/2020  PCP:  System, Provider Not In   [facility] Providers Cardiologist:  Sherren Mocha, MD     Discharge Diagnoses    Principal Problem:   Chest pain Active Problems:   Coronary artery disease due to lipid rich plaque   COPD (chronic obstructive pulmonary disease) ([place])   Tobacco abuse, in remission   Hypertension    Diagnostic Studies/Procedures    Left heart cath 07/09/20:   LV end diastolic pressure is moderately elevated. 17 mmHg  ------------------------  RCA is a large-caliber, dominant vessel  Prox RCA to Mid RCA bare-metal stent is 45% stenosed.  --- RPDA lesion is 60% stenosed.  Mid LAD lesion is 50% stenosed. Previously noted, mild diffuse disease otherwise noted.  ----1st Diag lesion is 100% stenosed. Known  Previously placed Ramus stent (unknown type) is widely patent. Remainder of the ramus intermedius is free of disease.  LCx is a relatively small caliber, nondominant vessel with 1 small OM branch in the AV groove branch. Most of the LCx territory is covered by the Ramus Intermedius.   POST-OPERATIVE DIAGNOSIS:  Stable three-vessel disease with widely patent stent in the proximal Ramus Intermedius, moderate 40 to 50% in-stent restenosis in the proximal RCA BMS, focal 50% mid LAD just prior to a bend (the previously noted subtotaled first diagonal branch is now fully occluded).  Severe Systemic Hypertensionwith moderately elevated LVEDP. (LV gram not performed because of contrast conservation)   RECOMMENDATIONS            Consider evaluation for causes of nonanginal chest pain  Patient will require additional blood pressure monitoring and IV fluid resuscitation after cardiac catheterization. We will determine discharge plans based on primary team discussion.   _____________   History of Present  Illness    Wayne Meadows has long standing CAD, received stent placement in 2009 at [facility].   He was admitted for NSTEMI on 02/23/2011 and emergent heart cath with performed by Dr. Lia Foyer , showed 99% stenosis of large RCA s/p PCI with BMS,  total occlusion of small diagonal 1 with some collateralization.  AV circumflex diffusely plaqued with patent stent and 60% beyond stent, LVEF is estimated 55 to 65%.  He was bradycardic during the procedure and required epinephrine/atropine/transvenous pacing.  He also developed post-procedure atrial fibrillation after the procedure.  Anticoagulation was not pursued due to known hemorrhagic renal cyst.   He was admitted shortly after on 03/18/11 for unstable angina, decision was made to repeat cardiac cath performed by Dr. Aundra Dubin, which revealed patent proximal to mid RCA stent.  No significant change of left system including 50% proximal LAD stenosis after D1, Chronically subtotally occluded D1 that was a small vessel with good collateral filling beyond the obstruction, large ramus with 30% proximal stenosis, left circumflex was a small vessel and patent mid left circumflex stent.  His pain was felt noncardiac and epigastric in origin. He was given protonix.   He was last evaluated by Dr. Lia Foyer in the office 08/25/2011, reported some right upper quadrant discomfort and plan for gallbladder surgery.  He was recommended exercise stress testing before gallbladder surgery.  He was maintained on medical therapy with aspirin, plavix, enalapril, metoprolol, simvastatin at the time.   He had no further follow up Dr Enid Baas since 2013.   He was recently admitted here from 06/20/2020-06/23/2020 for refractory nausea and vomiting. EGD showed  incidental esophageal stricture, otherwise normal EGD, he was recommended outpatient EUS. He had CTAP done on 4/28 which showed irregular gastric wall thickening about the cardia and greater curvature >3.5cm. ? Ileus. bilateral renal  cysts.  He complained of some left-sided chest wall pain and was treated with topical diclofenac.   He returned to the ER 07/08/20 with chief complaints of midsternal chest pain radiating to his back.  He described pain as a burning sensation.  He stateed his pain finally improved after he took 6 tablets of sublingual nitroglycerin and 324 mg aspirin and IV fentanyl at ED.  He reported intermittent dull achy chest pain over the past several weeks, but pain was much more severe today. He stated pain is not triggered by exertion, nor relieved by resting. He had been taking all his medication regularly. He had not followed up with cardiology since 2013. He reported mild dizziness earlier while walking to the bathroom. He reported chronic SOB with exertion due to COPD, denied worsening of SOB.  He denied any diaphoresis, paresthesia, nausea, vomiting.   Diagnostic work-up at ED revealed elevated glucose 175, creatinine 1.87, GFR 35.  CBC revealed hemoglobin 9.8, remarkable. Hs troponin negative x2.  Chest x-ray revealed COPD without acute finding.  EKG showed sinus rhythm with ventricular rate of 67 bpm, no acute change.  He was hemodynamically stable at ED.  He was given 50 mcg of fentanyl, 0.4 mg sublingual nitroglycerin x2.  Cardiology consult was requested for further management.    Hospital Course     Consultants: NA  Chest pain CAD s/p multiple PCI ( last in 2012 ) - presented with chest pain for 1 week - HS trop negative x2 - EKG without acute ischemic changes  - Echo from 02/08/2020 with EF 50 to 55%, mild TR, mild RV systolic pressure elevated 37-49 mmHg - Left heart cath on 07/09/20: LVEDP 59mmHg, Stable three-vessel disease with widely patent stent in the proximal Ramus Intermedius, moderate 40 to 50% in-stent restenosis in the proximal RCA BMS, focal 50% mid LAD just prior to a bend (the previously noted subtotaled first diagonal branch is now fully occluded). Severe Systemic  Hypertensionwith moderately elevated LVEDP. - chest pain is possible MUSK in origin  - continue home dose GDMT with aspirin + Plavix, enalapril, metoprolol, and statin  - follow up with cardiology on 08/21/20 arranged (first available appointment)   Hypertension - BP elevated, continued on amlodipine, metoprolol, and clonidine, will resume enalapril 20mg  today (was held before cath) and increased amlodipine to 10mg  daily at discharge - re-evaluate BP in the office   HLD - LDL 38 from 07/09/20 , at goal  - continue pravastatin 40 mg daily   Hypothyroidism - resume levothyroxine , follow up with PCP   GERD - continue Protonix    COPD - no evidence of acute exacerbation   CKD stage IIIb - renal index near baseline - gentle IVF hydration was given for renal hydration pre/post cath  Hx of CVA  - no residual deficit, on DAPT + statin     Did the patient have an acute coronary syndrome (MI, NSTEMI, STEMI, etc) this admission?:  No                                Did the patient have a percutaneous coronary intervention (stent / angioplasty)?:  No.       _____________  Discharge Vitals Blood pressure Marland Kitchen)  179/70, pulse 79, temperature 97.6 F (36.4 C), temperature source Oral, resp. rate 18, height 6\' 2"  (1.88 m), weight 88.6 kg, SpO2 93 %.  Filed Weights   07/08/20 0929 07/09/20 0255 07/10/20 0422  Weight: 93.9 kg 89 kg 88.6 kg    Labs & Radiologic Studies    CBC Recent Labs    07/08/20 0935 07/09/20 0500  WBC 9.3 7.4  NEUTROABS 7.2  --   HGB 9.8* 12.7*  HCT 31.5* 39.8  MCV 95.7 93.6  PLT 358 245   Basic Metabolic Panel Recent Labs    07/09/20 0500 07/10/20 0128  NA 137 140  K 4.1 4.3  CL 103 110  CO2 28 25  GLUCOSE 101* 102*  BUN 14 11  CREATININE 1.90* 1.77*  CALCIUM 8.6* 8.6*   Liver Function Tests No results for input(s): AST, ALT, ALKPHOS, BILITOT, PROT, ALBUMIN in the last 72 hours. No results for input(s): LIPASE, AMYLASE in the last 72  hours. High Sensitivity Troponin:   Recent Labs  Lab 07/08/20 0935 07/08/20 1147  TROPONINIHS 5 4    BNP Invalid input(s): POCBNP D-Dimer No results for input(s): DDIMER in the last 72 hours. Hemoglobin A1C Recent Labs    07/09/20 0500  HGBA1C 7.3*   Fasting Lipid Panel Recent Labs    07/09/20 0500  CHOL 84  HDL 22*  LDLCALC 38  TRIG 119  CHOLHDL 3.8   Thyroid Function Tests No results for input(s): TSH, T4TOTAL, T3FREE, THYROIDAB in the last 72 hours.  Invalid input(s): FREET3 _____________  CT ABDOMEN PELVIS WO CONTRAST  Result Date: 06/20/2020 CLINICAL DATA:  Abdominal distension. Nausea. Diverticulitis suspected. EXAM: CT ABDOMEN AND PELVIS WITHOUT CONTRAST TECHNIQUE: Multidetector CT imaging of the abdomen and pelvis was performed following the standard protocol without IV contrast. COMPARISON:  CT 08/19/2015 FINDINGS: Lower chest: Linear atelectasis in the right lower lobe. No pleural fluid. Normal heart size with coronary artery calcifications. Hepatobiliary: Vague low-density in the right lobe of the liver corresponds to hemangioma on prior contrast-enhanced exam. No evidence of new hepatic lesion. Cholecystectomy without biliary dilatation. Pancreas: Inflammatory fat stranding in the left upper quadrant fat abuts the tail the pancreas, however pancreatic inflammation is not favored at this time. Scattered parenchymal calcifications. No ductal dilatation. Spleen: Normal in size without focal abnormality. Adrenals/Urinary Tract: No adrenal nodule. No hydronephrosis. There is symmetric bilateral perinephric edema, also seen on prior exam. Multiple bilateral low-density lesions consistent with cysts. There are also 2 lesions are not definitively cysts. In the left kidney there is a 16 mm lesion with Hounsfield units of 27, this previously measured 12 mm in 2017. In the upper right kidney there is a subcentimeter hyperdense lesion with Hounsfield units of 36. Isodense to renal  parenchymal lesion in the anterior mid left kidney corresponds to a probable hyperdense cyst on prior exam. This was not definitively seen on prior. No visualized renal calculi. Unremarkable urinary bladder. Stomach/Bowel: There is irregular gastric wall thickening about the cardia and greater curvature, for example series 3, image 21. Wall thickening measures at least 3.5 cm. The administered enteric contrast has exited the stomach, with gastric nondistention limiting detailed assessment. There is adjacent fat stranding in the perigastric fat in the left upper quadrant, prominent in degree. No extraluminal air or enteric contrast. Tiny intramural lipoma in the duodenum. This is of no clinical significance. Mildly dilated small bowel in the lower abdomen measuring up to 3.2 cm. Distal small bowel are nondistended, however no discrete transition  point is seen. Extensive colonic diverticulosis. No discrete diverticulitis or colonic inflammation. Appendix is not confidently visualized. No evidence of appendicitis. Vascular/Lymphatic: Aortic and branch atherosclerosis. No aortic aneurysm. Scattered small upper abdominal and perigastric nodes. No enlarged lymph nodes by size criteria. Reproductive: Prostate is unremarkable. Other: Prominent fat stranding and inflammation in the left upper quadrant, which appears to be related to adjacent gastric wall thickening. Small amount of fluid or soft tissue thickening in the left pericolic gutter, series 3, images 35 and 43, may be reactive but is nonspecific. Broad-based lower ventral abdominal wall laxity with rectus diastasis. No free air. No focal fluid collection. Musculoskeletal: Bones diffusely under mineralized. Posterior lumbar fusion L2 through S1. Focal kyphosis at L1-L2 with disc space obliteration. This is not significantly changed from prior exam. Right hip arthroplasty. IMPRESSION: 1. Irregular gastric wall thickening about the cardia and greater curvature, greater  than 3.5 cm. Differential considerations include neoplasm, peptic ulcer disease or less likely focal gastritis. There is significant adjacent inflammation in the left upper quadrant, but no free air or frank perforation. This inflammation abuts the tail the pancreas, however appears to be related to stomach rather than pancreatic. Endoscopy recommended for further evaluation. 2. Small amount of free fluid or soft tissue density in the left pericolic gutter, possibly reactive but indeterminate. 3. Mildly dilated small bowel in the lower abdomen without discrete transition point, suspicious for ileus. 4. Extensive colonic diverticulosis without diverticulitis. 5. Bilateral renal cysts. There also 2 indeterminate lesions, 1 in the right and 1 in the left kidney. Left renal lesion has slightly increased in size from 2017, currently 16 mm. Recommend MRI characterization after resolution of acute event on an elective outpatient basis. Aortic Atherosclerosis (ICD10-I70.0). Electronically Signed   By: Keith Rake M.D.   On: 06/20/2020 15:43   DG Chest 2 View  Result Date: 07/08/2020 CLINICAL DATA:  Chest pain and back pain EXAM: CHEST - 2 VIEW COMPARISON:  06/23/2020 FINDINGS: Normal heart size and mediastinal contours. Hyperinflation with diaphragm flattening and interstitial coarsening at the bases. There is no edema, consolidation, effusion, or pneumothorax. IMPRESSION: COPD without acute superimposed finding. Electronically Signed   By: Monte Fantasia M.D.   On: 07/08/2020 10:39   DG Chest 2 View  Result Date: 06/23/2020 CLINICAL DATA:  Left-sided chest pain. EXAM: CHEST - 2 VIEW COMPARISON:  07/31/2017 FINDINGS: Lungs are hyperexpanded. Stable scarring at the right base. Linear atelectasis or scarring again noted at the left base. The cardiopericardial silhouette is within normal limits for size. The visualized bony structures of the thorax show no acute abnormality. Bones are diffusely demineralized.  IMPRESSION: Emphysema with basilar scarring.  No acute cardiopulmonary findings. Electronically Signed   By: Misty Stanley M.D.   On: 06/23/2020 16:00   CARDIAC CATHETERIZATION  Result Date: 7/61/6073  LV end diastolic pressure is moderately elevated. 17 mmHg  ------------------------  RCA is a large-caliber, dominant vessel  Prox RCA to Mid RCA bare-metal stent is 45% stenosed.  --- RPDA lesion is 60% stenosed.  Mid LAD lesion is 50% stenosed. Previously noted, mild diffuse disease otherwise noted.  ----1st Diag lesion is 100% stenosed. Known  Previously placed Ramus stent (unknown type) is widely patent. Remainder of the ramus intermedius is free of disease.  LCx is a relatively small caliber, nondominant vessel with 1 small OM branch in the AV groove branch. Most of the LCx territory is covered by the Ramus Intermedius.  POST-OPERATIVE DIAGNOSIS:   Stable three-vessel disease with widely  patent stent in the proximal Ramus Intermedius, moderate 40 to 50% in-stent restenosis in the proximal RCA BMS, focal 50% mid LAD just prior to a bend (the previously noted subtotaled first diagonal branch is now fully occluded).  Severe Systemic Hypertension with moderately elevated LVEDP.  (LV gram not performed because of contrast conservation) RECOMMENDATIONS  Consider evaluation for causes of nonanginal chest pain  Patient will require additional blood pressure monitoring and IV fluid resuscitation after cardiac catheterization.  We will determine discharge plans based on primary team discussion. Glenetta Hew, MD  Disposition   Pt is being discharged home today in good condition.  Follow-up Plans & Appointments     Follow-up Information    Imogene Burn, PA-C Follow up on 08/21/2020.   Specialty: Cardiology Why: at 11:15 am  Contact information: [address] [place] [address] [phone]              Discharge Instructions    Diet - low sodium heart healthy    Complete by: As directed    Discharge instructions   Complete by: As directed    Radial Site Care  Refer to this sheet in the next few weeks. These instructions provide you with information on caring for yourself after your procedure. Your caregiver may also give you more specific instructions. Your treatment has been planned according to current medical practices, but problems sometimes occur. Call your caregiver if you have any problems or questions after your procedure.  HOME CARE INSTRUCTIONS You may shower the day after the procedure.Remove the bandage (dressing) and gently wash the site with plain soap and water.Gently pat the site dry.  Do not apply powder or lotion to the site.  Do not submerge the affected site in water for 3 to 5 days.  Inspect the site at least twice daily.  Do not flex or bend the affected arm for 24 hours.  No lifting over 5 pounds (2.3 kg) for 5 days after your procedure.  Do not drive home if you are discharged the same day of the procedure. Have someone else drive you.  You may drive 24 hours after the procedure unless otherwise instructed by your caregiver.   What to expect: Any bruising will usually fade within 1 to 2 weeks.  Blood that collects in the tissue (hematoma) may be painful to the touch. It should usually decrease in size and tenderness within 1 to 2 weeks.   SEEK IMMEDIATE MEDICAL CARE IF: You have unusual pain at the radial site.  You have redness, warmth, swelling, or pain at the radial site.  You have drainage (other than a small amount of blood on the dressing).  You have chills.  You have a fever or persistent symptoms for more than 72 hours.  You have a fever and your symptoms suddenly get worse.  Your arm becomes pale, cool, tingly, or numb.  You have heavy bleeding from the site. Hold pressure on the site.   PLEASE DO NOT MISS ANY DOSES OF YOUR ASPIRIN/PLAVIX!!!!! Also keep a log of you blood pressures and bring back to your  follow up appt. Please call the office with any questions.   Patients taking blood thinners should generally stay away from medicines like ibuprofen, Advil, Motrin, naproxen, and Aleve due to risk of stomach bleeding. You may take Tylenol as directed or talk to your primary doctor about alternatives.   Increase activity slowly   Complete by: As directed  Discharge Medications   Allergies as of 07/10/2020      Reactions   Bee Venom Anaphylaxis   Has epi pen   Influenza Vaccines Other (See Comments)   "Mortally sick for 2 weeks"      Medication List    TAKE these medications   albuterol 108 (90 Base) MCG/ACT inhaler Commonly known as: VENTOLIN HFA Inhale 1 puff into the lungs every 6 (six) hours as needed for shortness of breath.   amLODipine 10 MG tablet Commonly known as: NORVASC Take 1 tablet (10 mg total) by mouth daily. Start taking on: Jul 11, 2020 What changed:   medication strength  how much to take   aspirin EC 81 MG tablet Take 81 mg by mouth daily.   baclofen 10 MG tablet Commonly known as: LIORESAL Take 10 mg by mouth at bedtime.   cloNIDine 0.1 MG tablet Commonly known as: CATAPRES Take 1 tablet (0.1 mg total) by mouth daily.   clopidogrel 75 MG tablet Commonly known as: PLAVIX Take 75 mg by mouth daily.   diclofenac Sodium 1 % Gel Commonly known as: VOLTAREN Apply 2 g topically 4 (four) times daily.   enalapril 20 MG tablet Commonly known as: VASOTEC Take 20 mg by mouth at bedtime.   EPINEPHrine 0.3 mg/0.3 mL Devi Commonly known as: EPI-PEN Inject 0.3 mg into the muscle once as needed (for anaphylactic reaction).   gabapentin 300 MG capsule Commonly known as: NEURONTIN Take 300-600 mg by mouth See admin instructions. Take 300 mg by mouth in the morning then 300 mg at noon then 600 mg at bedtime   HYDROcodone-acetaminophen 10-325 MG tablet Commonly known as: NORCO Take 1 tablet by mouth 2 (two) times daily.   Ipratropium-Albuterol  20-100 MCG/ACT Aers respimat Commonly known as: COMBIVENT Inhale 1 puff into the lungs 4 (four) times daily.   levothyroxine 50 MCG tablet Commonly known as: SYNTHROID Take 50 mcg by mouth daily before breakfast.   melatonin 3 MG Tabs tablet Take 6 mg by mouth at bedtime.   metoprolol tartrate 25 MG tablet Commonly known as: LOPRESSOR Take 25 mg by mouth daily.   mometasone 220 MCG/INH inhaler Commonly known as: ASMANEX Inhale 1 puff into the lungs at bedtime.   multivitamin tablet Take 1 tablet by mouth daily.   nitroGLYCERIN 0.4 MG SL tablet Commonly known as: NITROSTAT Place 0.4 mg under the tongue every 5 (five) minutes as needed for chest pain.   ondansetron 4 MG tablet Commonly known as: ZOFRAN Take 1 tablet (4 mg total) by mouth every 6 (six) hours as needed for nausea.   pantoprazole 40 MG tablet Commonly known as: PROTONIX Take 1 tablet (40 mg total) by mouth 2 (two) times daily.   polyethylene glycol 17 g packet Commonly known as: MIRALAX / GLYCOLAX Take 17 g by mouth daily. What changed:   when to take this  reasons to take this   pravastatin 40 MG tablet Commonly known as: PRAVACHOL Take 40 mg by mouth at bedtime.          Outstanding Labs/Studies     Duration of Discharge Encounter   Greater than 30 minutes including physician time.  Signed, Margie Billet, NP 07/10/2020, 11:18 AM

## 2020-07-10 NOTE — Progress Notes (Signed)
Progress Note  Patient Name: Wayne Meadows Date of Encounter: 07/10/2020  [facility] Cardiologist: None   Subjective   Complaining of back pain this morning.  This is chronic and he takes hydrocodone/acetaminophen for this.  No chest pain or pressure.  No shortness of breath.  Inpatient Medications    Scheduled Meds: . amLODipine  5 mg Oral Daily  . aspirin EC  81 mg Oral Daily  . baclofen  10 mg Oral QHS  . budesonide (PULMICORT) nebulizer solution  0.25 mg Nebulization BID  . Chlorhexidine Gluconate Cloth  6 each Topical Q0600  . cloNIDine  0.1 mg Oral Daily  . clopidogrel  75 mg Oral Daily  . heparin  5,000 Units Subcutaneous Q8H  . levothyroxine  50 mcg Oral Q0600  . melatonin  6 mg Oral QHS  . metoprolol tartrate  25 mg Oral Daily  . mupirocin ointment  1 application Nasal BID  . pantoprazole  40 mg Oral BID  . pravastatin  40 mg Oral QHS  . sodium chloride flush  3 mL Intravenous Q12H  . sodium chloride flush  3 mL Intravenous Q12H   Continuous Infusions: . sodium chloride     PRN Meds: sodium chloride, acetaminophen, albuterol, HYDROcodone-acetaminophen, nitroGLYCERIN, ondansetron, polyethylene glycol, sodium chloride flush   Vital Signs    Vitals:   07/10/20 0300 07/10/20 0422 07/10/20 0817 07/10/20 0839  BP: (!) 141/58  (!) 179/70   Pulse: 71  80 79  Resp: 16  20 18   Temp: 98.6 F (37 C)  97.6 F (36.4 C)   TempSrc: Oral  Oral   SpO2: 91%  92% 93%  Weight:  88.6 kg    Height:        Intake/Output Summary (Last 24 hours) at 07/10/2020 1043 Last data filed at 07/09/2020 1900 Gross per 24 hour  Intake 1143.23 ml  Output --  Net 1143.23 ml   Last 3 Weights 07/10/2020 07/09/2020 07/08/2020  Weight (lbs) 195 lb 5.2 oz 196 lb 3.4 oz 207 lb  Weight (kg) 88.6 kg 89 kg 93.895 kg      Telemetry    Normal sinus rhythm without significant arrhythmia - Personally Reviewed  ECG    Normal sinus rhythm 83 bpm, within normal limits - Personally  Reviewed  Physical Exam  Alert, oriented, no distress GEN: No acute distress.   Neck: No JVD Cardiac: RRR, no murmurs, rubs, or gallops.  Respiratory: Clear to auscultation bilaterally. GI: Soft, nontender, non-distended  MS: No edema; No deformity.  Right radial cath site is clear. Neuro:  Nonfocal  Psych: Normal affect   Labs    High Sensitivity Troponin:   Recent Labs  Lab 07/08/20 0935 07/08/20 1147  TROPONINIHS 5 4      Chemistry Recent Labs  Lab 07/08/20 0935 07/09/20 0500 07/10/20 0128  NA 137 137 140  K 4.5 4.1 4.3  CL 102 103 110  CO2 28 28 25   GLUCOSE 175* 101* 102*  BUN 16 14 11   CREATININE 1.87* 1.90* 1.77*  CALCIUM 8.5* 8.6* 8.6*  GFRNONAA 35* 35* 38*  ANIONGAP 7 6 5      Hematology Recent Labs  Lab 07/08/20 0935 07/09/20 0500  WBC 9.3 7.4  RBC 3.29* 4.25  HGB 9.8* 12.7*  HCT 31.5* 39.8  MCV 95.7 93.6  MCH 29.8 29.9  MCHC 31.1 31.9  RDW 14.1 14.1  PLT 358 280    BNPNo results for input(s): BNP, PROBNP in the last 168 hours.  DDimer No results for input(s): DDIMER in the last 168 hours.   Radiology    CARDIAC CATHETERIZATION  Result Date: 9/48/0165  LV end diastolic pressure is moderately elevated. 17 mmHg  ------------------------  RCA is a large-caliber, dominant vessel  Prox RCA to Mid RCA bare-metal stent is 45% stenosed.  --- RPDA lesion is 60% stenosed.  Mid LAD lesion is 50% stenosed. Previously noted, mild diffuse disease otherwise noted.  ----1st Diag lesion is 100% stenosed. Known  Previously placed Ramus stent (unknown type) is widely patent. Remainder of the ramus intermedius is free of disease.  LCx is a relatively small caliber, nondominant vessel with 1 small OM branch in the AV groove branch. Most of the LCx territory is covered by the Ramus Intermedius.  POST-OPERATIVE DIAGNOSIS:   Stable three-vessel disease with widely patent stent in the proximal Ramus Intermedius, moderate 40 to 50% in-stent restenosis in the  proximal RCA BMS, focal 50% mid LAD just prior to a bend (the previously noted subtotaled first diagonal branch is now fully occluded).  Severe Systemic Hypertension with moderately elevated LVEDP.  (LV gram not performed because of contrast conservation) RECOMMENDATIONS  Consider evaluation for causes of nonanginal chest pain  Patient will require additional blood pressure monitoring and IV fluid resuscitation after cardiac catheterization.  We will determine discharge plans based on primary team discussion. Glenetta Hew, MD   Cardiac Studies   Cardiac catheterization films reviewed as above  Patient Profile     86 y.o. male presents with symptoms concerning for unstable angina, negative objective markers, undergoes cardiac catheterization 07/09/2020  Assessment & Plan    1.  Chest pain at rest: Reviewed coronary angiogram images from yesterday's procedure and the patient has moderate nonobstructive coronary artery disease with no high-grade stenoses.  Ongoing medical therapy is indicated.  The patient is medically stable for discharge today.  He will be continued on his current regimen.  2.  Hypertension: Blood pressure controlled on current medical regimen.  3.  Mixed hyperlipidemia: Lipids at goal with LDL cholesterol less than 55 on pravastatin.  4.  Stage IIIb chronic kidney disease: Renal function stable post catheterization treated with adequate prehydration.  Disposition: Medically stable for discharge home today.  Treated with aspirin, clopidogrel, beta-blocker, statin drug, and amlodipine.  Okay to start back on enalapril tonight.  For questions or updates, please contact [place] Please consult [email or web address] for contact info under        Signed, Sherren Mocha, MD  07/10/2020, 10:43 AM

## 2020-07-10 NOTE — Discharge Instructions (Signed)

## 2020-07-12 ENCOUNTER — Telehealth: Payer: Self-pay

## 2020-07-12 NOTE — Telephone Encounter (Signed)
-----   Message from Timothy Lasso, RN sent at 07/10/2020  9:11 AM EDT -----  ----- Message ----- From: Timothy Lasso, RN Sent: 07/10/2020  12:00 AM EDT To: Timothy Lasso, RN  Make sure we have anti coag response from Dr Joesph Fillers

## 2020-07-12 NOTE — Telephone Encounter (Signed)
Dr Ardis Hughs while reviewing the chart for plavix hold I see that the pt was discharged from the hospital on 5/18-see below.  Should he proceed with EUS on 5/26?  Left heart cath 07/09/20:   LV end diastolic pressure is moderately elevated. 17 mmHg  ------------------------  RCA is a large-caliber, dominant vessel  Prox RCA to Mid RCA bare-metal stent is 45% stenosed.  --- RPDA lesion is 60% stenosed.  Mid LAD lesion is 50% stenosed. Previously noted, mild diffuse disease otherwise noted.  ----1st Diag lesion is 100% stenosed. Known  Previously placed Ramus stent (unknown type) is widely patent. Remainder of the ramus intermedius is free of disease.  LCx is a relatively small caliber, nondominant vessel with 1 small OM branch in the AV groove branch. Most of the LCx territory is covered by the Ramus Intermedius.  POST-OPERATIVE DIAGNOSIS:  Stable three-vessel disease with widely patent stent in the proximal Ramus Intermedius, moderate 40 to 50% in-stent restenosis in the proximal RCA BMS, focal 50% mid LAD just prior to a bend (the previously noted subtotaled first diagonal branch is now fully occluded).  Severe Systemic Hypertensionwith moderately elevated LVEDP. (LV gram not performed because of contrast conservation)   RECOMMENDATIONS  Consider evaluation for causes of nonanginal chest pain  Patient will require additional blood pressure monitoring and IV fluid resuscitation after cardiac catheterization. We will determine discharge plans based on primary team discussion.

## 2020-07-15 ENCOUNTER — Encounter (HOSPITAL_COMMUNITY): Payer: Self-pay | Admitting: Gastroenterology

## 2020-07-15 ENCOUNTER — Other Ambulatory Visit (HOSPITAL_COMMUNITY): Payer: No Typology Code available for payment source

## 2020-07-15 ENCOUNTER — Other Ambulatory Visit: Payer: Self-pay

## 2020-07-15 NOTE — Telephone Encounter (Signed)
The pt has been advised and will hold plavix until after procedure

## 2020-07-15 NOTE — Telephone Encounter (Signed)
Yes, it is still a good idea given his abnormal CT scan, GI symptoms.

## 2020-07-15 NOTE — Telephone Encounter (Signed)
PLEASE RESPOND ASAP PT PROCEDURE 5/26 [facility] Pre-operative Risk Assessment     Request for surgical clearance:     Endoscopy Procedure  What type of surgery is being performed?     EUS  When is this surgery scheduled?     5/26 PLEASE RESPOND ASAP  What type of clearance is required ?   Pharmacy  Are there any medications that need to be held prior to surgery and how long? PLAVIX  Practice name and name of physician performing surgery?      [facility]  What is your office phone and fax number?      Phone- [phone]  [phone]  Anesthesia type (None, local, MAC, general) ?       MAC

## 2020-07-15 NOTE — Telephone Encounter (Signed)
   Primary Cardiologist: Sherren Mocha, MD  Chart reviewed as part of pre-operative protocol coverage.   86 y.o. male with . Coronary artery disease  o S/p PCI to RI in 2009 [facility]) o NSTEMI 2012 s/p BMS to RCA o Cath 07/09/20: patent stents in RI, RCA; chronic occlusion of D1, mod non-obs dz in LAD. Marland Kitchen Hypertension  . Hyperlipidemia  . COPD . Chronic kidney disease  . Hx of CVA   Reviewed question of holding Clopidogrel (Plavix) with Dr. Burt Knack.   Recommendations: . Patient may hold Clopidogrel (Plavix) for procedure on 5/26.  Please resume post op as soon as it is felt to be safe.   Please call with questions. Richardson Dopp, PA-C 07/15/2020, 11:10 AM

## 2020-07-15 NOTE — Progress Notes (Signed)
Attempted to obtain medical history via telephone, unable to reach at this time. I left a voicemail to return pre surgical testing department's phone call.  

## 2020-07-15 NOTE — Telephone Encounter (Signed)
Dr Ardis Hughs I spoke with the pt and he has not heard from Dr Sherral Hammers regarding his plavix. I have not gotten a response either after multiple attempts.  Dr Sherral Hammers is a [facility] and per the pt it is very difficult to get anyone on the phone.  He tells me that he has stopped his plavix for procedures in the past.  He has not taken it today and his procedure is 5/26.  How would you like to proceed?

## 2020-07-15 NOTE — Telephone Encounter (Signed)
He just had an angiogram with [facility], can you reach out to Dr. Ellyn Hack for his opinion about plavix.  Thanks

## 2020-07-18 ENCOUNTER — Ambulatory Visit (HOSPITAL_COMMUNITY): Payer: Medicare Other | Admitting: Anesthesiology

## 2020-07-18 ENCOUNTER — Other Ambulatory Visit: Payer: Self-pay

## 2020-07-18 ENCOUNTER — Encounter (HOSPITAL_COMMUNITY): Payer: Self-pay | Admitting: Gastroenterology

## 2020-07-18 ENCOUNTER — Ambulatory Visit (HOSPITAL_COMMUNITY)
Admission: RE | Admit: 2020-07-18 | Discharge: 2020-07-18 | Disposition: A | Discharge status: Home / Self Care | Payer: Medicare Other | Attending: Gastroenterology | Admitting: Gastroenterology

## 2020-07-18 ENCOUNTER — Encounter (HOSPITAL_COMMUNITY)
Admission: RE | Disposition: A | Discharge status: Home / Self Care | Payer: Self-pay | Source: Home / Self Care | Attending: Gastroenterology

## 2020-07-18 DIAGNOSIS — Z79899 Other long term (current) drug therapy: Secondary | ICD-10-CM | POA: Diagnosis not present

## 2020-07-18 DIAGNOSIS — Z955 Presence of coronary angioplasty implant and graft: Secondary | ICD-10-CM | POA: Diagnosis not present

## 2020-07-18 DIAGNOSIS — R634 Abnormal weight loss: Secondary | ICD-10-CM | POA: Insufficient documentation

## 2020-07-18 DIAGNOSIS — Z7902 Long term (current) use of antithrombotics/antiplatelets: Secondary | ICD-10-CM | POA: Insufficient documentation

## 2020-07-18 DIAGNOSIS — Z87891 Personal history of nicotine dependence: Secondary | ICD-10-CM | POA: Diagnosis not present

## 2020-07-18 DIAGNOSIS — J441 Chronic obstructive pulmonary disease with (acute) exacerbation: Secondary | ICD-10-CM | POA: Diagnosis not present

## 2020-07-18 DIAGNOSIS — R630 Anorexia: Secondary | ICD-10-CM | POA: Insufficient documentation

## 2020-07-18 DIAGNOSIS — D62 Acute posthemorrhagic anemia: Secondary | ICD-10-CM | POA: Diagnosis not present

## 2020-07-18 DIAGNOSIS — K3189 Other diseases of stomach and duodenum: Secondary | ICD-10-CM | POA: Diagnosis not present

## 2020-07-18 DIAGNOSIS — Z8673 Personal history of transient ischemic attack (TIA), and cerebral infarction without residual deficits: Secondary | ICD-10-CM | POA: Insufficient documentation

## 2020-07-18 DIAGNOSIS — Z79891 Long term (current) use of opiate analgesic: Secondary | ICD-10-CM | POA: Insufficient documentation

## 2020-07-18 DIAGNOSIS — R8569 Abnormal cytological findings in specimens from other digestive organs and abdominal cavity: Secondary | ICD-10-CM | POA: Diagnosis not present

## 2020-07-18 DIAGNOSIS — Z6825 Body mass index (BMI) 25.0-25.9, adult: Secondary | ICD-10-CM | POA: Diagnosis not present

## 2020-07-18 DIAGNOSIS — K859 Acute pancreatitis without necrosis or infection, unspecified: Secondary | ICD-10-CM | POA: Diagnosis not present

## 2020-07-18 DIAGNOSIS — Z7982 Long term (current) use of aspirin: Secondary | ICD-10-CM | POA: Insufficient documentation

## 2020-07-18 DIAGNOSIS — R933 Abnormal findings on diagnostic imaging of other parts of digestive tract: Secondary | ICD-10-CM | POA: Insufficient documentation

## 2020-07-18 DIAGNOSIS — Z7951 Long term (current) use of inhaled steroids: Secondary | ICD-10-CM | POA: Diagnosis not present

## 2020-07-18 DIAGNOSIS — K222 Esophageal obstruction: Secondary | ICD-10-CM | POA: Diagnosis not present

## 2020-07-18 HISTORY — PX: FINE NEEDLE ASPIRATION: SHX5430

## 2020-07-18 HISTORY — PX: EUS: SHX5427

## 2020-07-18 HISTORY — PX: ESOPHAGOGASTRODUODENOSCOPY: SHX5428

## 2020-07-18 SURGERY — UPPER ENDOSCOPIC ULTRASOUND (EUS) RADIAL
Anesthesia: Monitor Anesthesia Care

## 2020-07-18 MED ORDER — CIPROFLOXACIN IN D5W 400 MG/200ML IV SOLN
INTRAVENOUS | Status: AC
  Filled 2020-07-18: qty 200

## 2020-07-18 MED ORDER — PROPOFOL 1000 MG/100ML IV EMUL
INTRAVENOUS | Status: AC
  Filled 2020-07-18: qty 100

## 2020-07-18 MED ORDER — LACTATED RINGERS IV SOLN: INTRAVENOUS | Status: DC | PRN

## 2020-07-18 MED ORDER — PHENYLEPHRINE 40 MCG/ML (10ML) SYRINGE FOR IV PUSH (FOR BLOOD PRESSURE SUPPORT)
PREFILLED_SYRINGE | INTRAVENOUS | Status: DC | PRN
  Administered 2020-07-18: 80 ug via INTRAVENOUS
  Administered 2020-07-18: 120 ug via INTRAVENOUS
  Administered 2020-07-18: 80 ug via INTRAVENOUS

## 2020-07-18 MED ORDER — PROPOFOL 10 MG/ML IV BOLUS
INTRAVENOUS | Status: DC | PRN
  Administered 2020-07-18: 20 mg via INTRAVENOUS
  Administered 2020-07-18: 30 mg via INTRAVENOUS

## 2020-07-18 MED ORDER — CIPROFLOXACIN HCL 500 MG PO TABS: 500.0000 mg | ORAL_TABLET | Freq: Two times a day (BID) | ORAL | 0 refills | Status: AC

## 2020-07-18 MED ORDER — PROPOFOL 500 MG/50ML IV EMUL
INTRAVENOUS | Status: DC | PRN
  Administered 2020-07-18: 125 ug/kg/min via INTRAVENOUS

## 2020-07-18 MED ORDER — LACTATED RINGERS IV SOLN: Freq: Once | INTRAVENOUS | Status: AC

## 2020-07-18 MED ORDER — CIPROFLOXACIN IN D5W 400 MG/200ML IV SOLN
INTRAVENOUS | Status: DC | PRN
  Administered 2020-07-18: 400 mg via INTRAVENOUS

## 2020-07-18 MED ORDER — SODIUM CHLORIDE 0.9 % IV SOLN
INTRAVENOUS | Status: DC
Start: 2020-07-18 — End: 2020-07-18

## 2020-07-18 MED ORDER — LIDOCAINE 2% (20 MG/ML) 5 ML SYRINGE
INTRAMUSCULAR | Status: DC | PRN
  Administered 2020-07-18: 60 mg via INTRAVENOUS

## 2020-07-18 MED ORDER — PROPOFOL 500 MG/50ML IV EMUL
INTRAVENOUS | Status: AC
  Filled 2020-07-18: qty 50

## 2020-07-18 NOTE — Op Note (Addendum)
[facility] Patient Name: Wayne Meadows Procedure Date: 07/18/2020 MRN: [id number] Attending MD: Milus Banister , MD Date of Birth: [birth year] CSN: [id number] Age: 86 Admit Type: Inpatient Procedure:                Upper EUS Indications:              recent anorexia, weight loss; CT (without IV                            contrast) showed abnormal proximal gastric wall,                            abnormal pancreas; in past 1-2 weeks symptoms have                            completely resolved Providers:                Milus Banister, MD, Burtis Junes, RN, Fransico Setters                            Mbumina, Technician Referring MD:             Scarlette Shorts, MD Medicines:                Monitored Anesthesia Care, cipro 400mg  IV Complications:            No immediate complications. Estimated blood loss:                            None. Estimated Blood Loss:     Estimated blood loss: none. Procedure:                Pre-Anesthesia Assessment:                           - Prior to the procedure, a History and Physical                            was performed, and patient medications and                            allergies were reviewed. The patient's tolerance of                            previous anesthesia was also reviewed. The risks                            and benefits of the procedure and the sedation                            options and risks were discussed with the patient.                            All questions were answered, and informed consent  was obtained. Prior Anticoagulants: The patient has                            taken Plavix (clopidogrel), last dose was 4 days                            prior to procedure. ASA Grade Assessment: IV - A                            patient with severe systemic disease that is a                            constant threat to life. After reviewing the risks                            and benefits, the  patient was deemed in                            satisfactory condition to undergo the procedure.                           After obtaining informed consent, the endoscope was                            passed under direct vision. Throughout the                            procedure, the patient's blood pressure, pulse, and                            oxygen saturations were monitored continuously. The                            [id number] ([id number]) Olympus Radial EUS scope was                            introduced through the mouth, and advanced to the                            second part of duodenum. The upper EUS was                            accomplished without difficulty. The patient                            tolerated the procedure well. Scope In: Scope Out: Findings:      ENDOSCOPIC FINDING (limited views with radial and linear EUS scopes): :      The examined esophagus was endoscopically normal.      The entire examined stomach was endoscopically normal.      The examined duodenum was endoscopically normal.      ENDOSONOGRAPHIC FINDING: :      1. The region of the tail of pancreas and nearby proximal gastric wall  was very abnormal. There were two gastric intramural 'lesions' directly       adjacent to each other. These were mix solid, cystic (fluid) appearing.       The largest was 3.5cm by 3.4cm. I sampled this intramural gastric lesion       with two passes with a 41 guage EUS FNA and then 1 pass with a 19 gauge       EUS FNA needle when it was clear that this was a mainly fluid filled       lesion (white/yellow) that seemed most consistent with a region of       necrosis or infection. Perhaps an intramural gastric pseuodcyst. I send       the sample for cytology review, culture, sensitivity. Total fliud volume       was about 8cc.      2. The gastric intramural pathology was quite close in proximity to a       very abnormal tail of pancreas. There were clearly  changes of chronic       pancreatitis in the body and tail of the pancreas (several shadowing       calcifications on this exam and also on recent CT). These chronic       changes started mid-pancreatic body and continued through the tail of       the pancreas proximally. There parenchym in this distal section of the       pancreas was more hypoechoic than is usual. There were no discrete       masses or tumors. I elected to sample the tail of the pancreas within       this section with two passes of a 22 gauge EUS FNA needle.      3. No peripancreatic, perigastric adenopathy.      4. CBD was normal, non-dilated.      5. Pancreatic parenchyma in the uncinate, head, neck was more normal       appearing (less hypoechoic) but still quite lobular.      6. Limited views of the liver, spleen were normal. Impression:               - It is interesting that his GI symptoms improved                            7-10 days ago (anorexia is gone and he's started to                            put weight back on).                           - Very abnormal proximal gastric wall and nearby                            tail of pancreas. He clearly has changes of chronic                            pancreatitis (calcifications in the tail). The                            intramural gastric 'lesions' may represent  inflammatory chages (intramural pseudocysts?). 8 cc                            of milky, yellowish fluid was aspirated from the                            gastric wall 'lesions.' Sent for cytology and micro                            workup. I am putting him on cipro 500BID for 7 days                            on chance that this is infectious.                           - Abnormal tail of pancreas was also sampled by                            FNA. There were clear macroscopic changes of                            chronic pancreatitis (calcifications on this exam                             and recent CT). Changes of chronic pancreatitis                            reduce the sensitivity of this examination for                            detection neoplasms. Await final cytology results,                            preliminary readings did not show changes                            consistent with malignancy. Moderate Sedation:      Not Applicable - Patient had care per Anesthesia. Recommendation:           - Discharge patient to home.                           - Start cipro 500mg  BID for 7 days.                           - OK to resume your plavix tomorrow. Procedure Code(s):        --- Professional ---                           [phone], Esophagogastroduodenoscopy, flexible,                            transoral; with transendoscopic ultrasound-guided  intramural or transmural fine needle                            aspiration/biopsy(s), (includes endoscopic                            ultrasound examination limited to the esophagus,                            stomach or duodenum, and adjacent structures) Diagnosis Code(s):        --- Professional ---                           K31.89, Other diseases of stomach and duodenum                           R93.3, Abnormal findings on diagnostic imaging of                            other parts of digestive tract CPT copyright 2019 American [employer] Association. All rights reserved. The codes documented in this report are preliminary and upon coder review may  be revised to meet current compliance requirements. Milus Banister, MD 07/18/2020 12:24:19 PM This report has been signed electronically. Number of Addenda: 0

## 2020-07-18 NOTE — Discharge Instructions (Signed)
YOU HAD AN ENDOSCOPIC PROCEDURE TODAY: Refer to the procedure report and other information in the discharge instructions given to you for any specific questions about what was found during the examination. If this information does not answer your questions, please call [place] office at [phone] to clarify.   YOU SHOULD EXPECT: Some feelings of bloating in the abdomen. Passage of more gas than usual. Walking can help get rid of the air that was put into your GI tract during the procedure and reduce the bloating. If you had a lower endoscopy (such as a colonoscopy or flexible sigmoidoscopy) you may notice spotting of blood in your stool or on the toilet paper. Some abdominal soreness may be present for a day or two, also.  DIET: Your first meal following the procedure should be a light meal and then it is ok to progress to your normal diet. A half-sandwich or bowl of soup is an example of a good first meal. Heavy or fried foods are harder to digest and may make you feel nauseous or bloated. Drink plenty of fluids but you should avoid alcoholic beverages for 24 hours. If you had a esophageal dilation, please see attached instructions for diet.    ACTIVITY: Your care partner should take you home directly after the procedure. You should plan to take it easy, moving slowly for the rest of the day. You can resume normal activity the day after the procedure however YOU SHOULD NOT DRIVE, use power tools, machinery or perform tasks that involve climbing or major physical exertion for 24 hours (because of the sedation medicines used during the test).   SYMPTOMS TO REPORT IMMEDIATELY: A gastroenterologist can be reached at any hour. Please call [phone]  for any of the following symptoms:   Following upper endoscopy (EGD, EUS, ERCP, esophageal dilation) Vomiting of blood or coffee ground material  New, significant abdominal pain  New, significant chest pain or pain under the shoulder blades  Painful or  persistently difficult swallowing  New shortness of breath  Black, tarry-looking or red, bloody stools  FOLLOW UP:  If any biopsies were taken you will be contacted by phone or by letter within the next 1-3 weeks. Call [phone]  if you have not heard about the biopsies in 3 weeks.  Please also call with any specific questions about appointments or follow up tests.  

## 2020-07-18 NOTE — Transfer of Care (Signed)
Immediate Anesthesia Transfer of Care Note  Patient: Wayne Meadows  Procedure(s) Performed: UPPER ENDOSCOPIC ULTRASOUND (EUS) RADIAL (N/A ) ESOPHAGOGASTRODUODENOSCOPY (EGD) (N/A ) UPPER ENDOSCOPIC ULTRASOUND (EUS) LINEAR (N/A ) FINE NEEDLE ASPIRATION (FNA) LINEAR (N/A )  Patient Location: Endoscopy Unit  Anesthesia Type:MAC  Level of Consciousness: drowsy and patient cooperative  Airway & Oxygen Therapy: Patient Spontanous Breathing and Patient connected to face mask oxygen  Post-op Assessment: Report given to RN and Post -op Vital signs reviewed and stable  Post vital signs: Reviewed and stable  Last Vitals:  Vitals Value Taken Time  BP 144/81 07/18/20 1220  Temp 36.8 C 07/18/20 1217  Pulse 71 07/18/20 1220  Resp 13 07/18/20 1220  SpO2 100 % 07/18/20 1220  Vitals shown include unvalidated device data.  Last Pain:  Vitals:   07/18/20 1217  TempSrc: Oral  PainSc:          Complications: No complications documented.

## 2020-07-18 NOTE — Anesthesia Preprocedure Evaluation (Signed)
Anesthesia Evaluation  Patient identified by MRN, date of birth, ID band Patient awake    Reviewed: Allergy & Precautions, NPO status , Patient's Chart, lab work & pertinent test results  Airway Mallampati: II  TM Distance: >3 FB Neck ROM: Full    Dental no notable dental hx.    Pulmonary COPD, former smoker,    Pulmonary exam normal breath sounds clear to auscultation       Cardiovascular hypertension, + CAD, + Past MI, + Cardiac Stents and + Peripheral Vascular Disease  Normal cardiovascular exam Rhythm:Regular Rate:Normal     Neuro/Psych CVA negative psych ROS   GI/Hepatic negative GI ROS, Neg liver ROS,   Endo/Other  negative endocrine ROS  Renal/GU negative Renal ROS  negative genitourinary   Musculoskeletal negative musculoskeletal ROS (+)   Abdominal   Peds negative pediatric ROS (+)  Hematology negative hematology ROS (+)   Anesthesia Other Findings   Reproductive/Obstetrics negative OB ROS                             Anesthesia Physical Anesthesia Plan  ASA: III  Anesthesia Plan: MAC   Post-op Pain Management:    Induction: Intravenous  PONV Risk Score and Plan: 1 and Propofol infusion and Treatment may vary due to age or medical condition  Airway Management Planned: Simple Face Mask  Additional Equipment:   Intra-op Plan:   Post-operative Plan:   Informed Consent: I have reviewed the patients History and Physical, chart, labs and discussed the procedure including the risks, benefits and alternatives for the proposed anesthesia with the patient or authorized representative who has indicated his/her understanding and acceptance.     Dental advisory given  Plan Discussed with: CRNA and Surgeon  Anesthesia Plan Comments:         Anesthesia Quick Evaluation

## 2020-07-18 NOTE — Interval H&P Note (Signed)
History and Physical Interval Note:  07/18/2020 9:57 AM  Wayne Meadows  has presented today for surgery, with the diagnosis of abnormal stomach-weight loss.  The various methods of treatment have been discussed with the patient and family. After consideration of risks, benefits and other options for treatment, the patient has consented to  Procedure(s): UPPER ENDOSCOPIC ULTRASOUND (EUS) RADIAL (N/A) as a surgical intervention.  The patient's history has been reviewed, patient examined, no change in status, stable for surgery.  I have reviewed the patient's chart and labs.  Questions were answered to the patient's satisfaction.     Milus Banister

## 2020-07-18 NOTE — Anesthesia Postprocedure Evaluation (Signed)
Anesthesia Post Note  Patient: Wayne Meadows  Procedure(s) Performed: UPPER ENDOSCOPIC ULTRASOUND (EUS) RADIAL (N/A ) ESOPHAGOGASTRODUODENOSCOPY (EGD) (N/A ) UPPER ENDOSCOPIC ULTRASOUND (EUS) LINEAR (N/A ) FINE NEEDLE ASPIRATION (FNA) LINEAR (N/A )     Patient location during evaluation: PACU Anesthesia Type: MAC Level of consciousness: awake and alert Pain management: pain level controlled Vital Signs Assessment: post-procedure vital signs reviewed and stable Respiratory status: spontaneous breathing, nonlabored ventilation, respiratory function stable and patient connected to nasal cannula oxygen Cardiovascular status: stable and blood pressure returned to baseline Postop Assessment: no apparent nausea or vomiting Anesthetic complications: no   No complications documented.  Last Vitals:  Vitals:   07/18/20 1217 07/18/20 1220  BP: (!) 134/42 (!) 144/81  Pulse: 70 71  Resp: 14 13  Temp: 36.8 C   SpO2: 100% 100%    Last Pain:  Vitals:   07/18/20 1220  TempSrc:   PainSc: 0-No pain                 Cyann Venti S

## 2020-07-21 ENCOUNTER — Encounter (HOSPITAL_COMMUNITY): Payer: Self-pay | Admitting: Gastroenterology

## 2020-07-23 LAB — AEROBIC/ANAEROBIC CULTURE W GRAM STAIN (SURGICAL/DEEP WOUND)

## 2020-07-23 LAB — CYTOLOGY - NON PAP

## 2020-07-25 ENCOUNTER — Telehealth: Payer: Self-pay | Admitting: Gastroenterology

## 2020-07-25 NOTE — Telephone Encounter (Signed)
Inbound call from patient. Return Dr. Ardis Hughs call about results. Best contact number [phone]

## 2020-07-25 NOTE — Telephone Encounter (Signed)
Dr Ardis Hughs the pt is returning your call.

## 2020-08-11 ENCOUNTER — Other Ambulatory Visit: Payer: Self-pay

## 2020-08-11 ENCOUNTER — Inpatient Hospital Stay (HOSPITAL_COMMUNITY)
Admission: EM | Admit: 2020-08-11 | Discharge: 2020-08-15 | DRG: 440 | Disposition: A | Discharge status: Home / Self Care | Payer: No Typology Code available for payment source | Attending: Internal Medicine | Admitting: Internal Medicine

## 2020-08-11 ENCOUNTER — Encounter (HOSPITAL_COMMUNITY): Payer: Self-pay

## 2020-08-11 DIAGNOSIS — D1803 Hemangioma of intra-abdominal structures: Secondary | ICD-10-CM | POA: Diagnosis present

## 2020-08-11 DIAGNOSIS — Z87891 Personal history of nicotine dependence: Secondary | ICD-10-CM

## 2020-08-11 DIAGNOSIS — Z7902 Long term (current) use of antithrombotics/antiplatelets: Secondary | ICD-10-CM

## 2020-08-11 DIAGNOSIS — Z79899 Other long term (current) drug therapy: Secondary | ICD-10-CM

## 2020-08-11 DIAGNOSIS — I480 Paroxysmal atrial fibrillation: Secondary | ICD-10-CM | POA: Diagnosis present

## 2020-08-11 DIAGNOSIS — E785 Hyperlipidemia, unspecified: Secondary | ICD-10-CM | POA: Diagnosis present

## 2020-08-11 DIAGNOSIS — I10 Essential (primary) hypertension: Secondary | ICD-10-CM | POA: Diagnosis present

## 2020-08-11 DIAGNOSIS — I4891 Unspecified atrial fibrillation: Secondary | ICD-10-CM | POA: Diagnosis present

## 2020-08-11 DIAGNOSIS — Z79890 Hormone replacement therapy (postmenopausal): Secondary | ICD-10-CM

## 2020-08-11 DIAGNOSIS — Z79891 Long term (current) use of opiate analgesic: Secondary | ICD-10-CM

## 2020-08-11 DIAGNOSIS — Z91030 Bee allergy status: Secondary | ICD-10-CM

## 2020-08-11 DIAGNOSIS — Z9049 Acquired absence of other specified parts of digestive tract: Secondary | ICD-10-CM

## 2020-08-11 DIAGNOSIS — I129 Hypertensive chronic kidney disease with stage 1 through stage 4 chronic kidney disease, or unspecified chronic kidney disease: Secondary | ICD-10-CM | POA: Diagnosis present

## 2020-08-11 DIAGNOSIS — J441 Chronic obstructive pulmonary disease with (acute) exacerbation: Secondary | ICD-10-CM | POA: Diagnosis present

## 2020-08-11 DIAGNOSIS — J449 Chronic obstructive pulmonary disease, unspecified: Secondary | ICD-10-CM | POA: Diagnosis present

## 2020-08-11 DIAGNOSIS — I2583 Coronary atherosclerosis due to lipid rich plaque: Secondary | ICD-10-CM | POA: Diagnosis present

## 2020-08-11 DIAGNOSIS — D631 Anemia in chronic kidney disease: Secondary | ICD-10-CM | POA: Diagnosis present

## 2020-08-11 DIAGNOSIS — E039 Hypothyroidism, unspecified: Secondary | ICD-10-CM | POA: Diagnosis present

## 2020-08-11 DIAGNOSIS — K859 Acute pancreatitis without necrosis or infection, unspecified: Principal | ICD-10-CM | POA: Diagnosis present

## 2020-08-11 DIAGNOSIS — Z20822 Contact with and (suspected) exposure to covid-19: Secondary | ICD-10-CM | POA: Diagnosis present

## 2020-08-11 DIAGNOSIS — Z8673 Personal history of transient ischemic attack (TIA), and cerebral infarction without residual deficits: Secondary | ICD-10-CM

## 2020-08-11 DIAGNOSIS — N183 Chronic kidney disease, stage 3 unspecified: Secondary | ICD-10-CM | POA: Diagnosis present

## 2020-08-11 DIAGNOSIS — K861 Other chronic pancreatitis: Secondary | ICD-10-CM | POA: Diagnosis present

## 2020-08-11 DIAGNOSIS — G8929 Other chronic pain: Secondary | ICD-10-CM | POA: Diagnosis present

## 2020-08-11 DIAGNOSIS — E1122 Type 2 diabetes mellitus with diabetic chronic kidney disease: Secondary | ICD-10-CM | POA: Diagnosis present

## 2020-08-11 DIAGNOSIS — R933 Abnormal findings on diagnostic imaging of other parts of digestive tract: Secondary | ICD-10-CM

## 2020-08-11 DIAGNOSIS — N1832 Chronic kidney disease, stage 3b: Secondary | ICD-10-CM | POA: Diagnosis present

## 2020-08-11 DIAGNOSIS — Z85528 Personal history of other malignant neoplasm of kidney: Secondary | ICD-10-CM

## 2020-08-11 DIAGNOSIS — Z888 Allergy status to other drugs, medicaments and biological substances status: Secondary | ICD-10-CM

## 2020-08-11 DIAGNOSIS — Z955 Presence of coronary angioplasty implant and graft: Secondary | ICD-10-CM

## 2020-08-11 DIAGNOSIS — I252 Old myocardial infarction: Secondary | ICD-10-CM

## 2020-08-11 DIAGNOSIS — Z8249 Family history of ischemic heart disease and other diseases of the circulatory system: Secondary | ICD-10-CM

## 2020-08-11 DIAGNOSIS — I2510 Atherosclerotic heart disease of native coronary artery without angina pectoris: Secondary | ICD-10-CM | POA: Diagnosis present

## 2020-08-11 DIAGNOSIS — Z7982 Long term (current) use of aspirin: Secondary | ICD-10-CM

## 2020-08-11 NOTE — ED Triage Notes (Signed)
Pt to ED from home with c/o substernal CP which began Saturday morning. Pt had a heart cath 2 weeks ago, states it was clean. Pt states he also had a pancreatic biopsy 2 weeks ago. Endorses heavy lifting recently, but unsure of when that was. Pt is a poor historian. Arrives A+O, VSS, NADN.

## 2020-08-12 ENCOUNTER — Emergency Department (HOSPITAL_COMMUNITY): Payer: No Typology Code available for payment source

## 2020-08-12 DIAGNOSIS — E039 Hypothyroidism, unspecified: Secondary | ICD-10-CM | POA: Diagnosis present

## 2020-08-12 DIAGNOSIS — Z7982 Long term (current) use of aspirin: Secondary | ICD-10-CM | POA: Diagnosis not present

## 2020-08-12 DIAGNOSIS — I2583 Coronary atherosclerosis due to lipid rich plaque: Secondary | ICD-10-CM | POA: Diagnosis not present

## 2020-08-12 DIAGNOSIS — I4891 Unspecified atrial fibrillation: Secondary | ICD-10-CM | POA: Diagnosis present

## 2020-08-12 DIAGNOSIS — R933 Abnormal findings on diagnostic imaging of other parts of digestive tract: Secondary | ICD-10-CM | POA: Diagnosis not present

## 2020-08-12 DIAGNOSIS — K859 Acute pancreatitis without necrosis or infection, unspecified: Secondary | ICD-10-CM

## 2020-08-12 DIAGNOSIS — Z8249 Family history of ischemic heart disease and other diseases of the circulatory system: Secondary | ICD-10-CM | POA: Diagnosis not present

## 2020-08-12 DIAGNOSIS — Z7902 Long term (current) use of antithrombotics/antiplatelets: Secondary | ICD-10-CM | POA: Diagnosis not present

## 2020-08-12 DIAGNOSIS — Z85528 Personal history of other malignant neoplasm of kidney: Secondary | ICD-10-CM | POA: Diagnosis not present

## 2020-08-12 DIAGNOSIS — Z20822 Contact with and (suspected) exposure to covid-19: Secondary | ICD-10-CM | POA: Diagnosis present

## 2020-08-12 DIAGNOSIS — Z8673 Personal history of transient ischemic attack (TIA), and cerebral infarction without residual deficits: Secondary | ICD-10-CM | POA: Diagnosis not present

## 2020-08-12 DIAGNOSIS — E1122 Type 2 diabetes mellitus with diabetic chronic kidney disease: Secondary | ICD-10-CM | POA: Diagnosis present

## 2020-08-12 DIAGNOSIS — I2510 Atherosclerotic heart disease of native coronary artery without angina pectoris: Secondary | ICD-10-CM | POA: Diagnosis not present

## 2020-08-12 DIAGNOSIS — Z888 Allergy status to other drugs, medicaments and biological substances status: Secondary | ICD-10-CM | POA: Diagnosis not present

## 2020-08-12 DIAGNOSIS — E785 Hyperlipidemia, unspecified: Secondary | ICD-10-CM | POA: Diagnosis present

## 2020-08-12 DIAGNOSIS — J449 Chronic obstructive pulmonary disease, unspecified: Secondary | ICD-10-CM | POA: Diagnosis not present

## 2020-08-12 DIAGNOSIS — Z87891 Personal history of nicotine dependence: Secondary | ICD-10-CM | POA: Diagnosis not present

## 2020-08-12 DIAGNOSIS — I252 Old myocardial infarction: Secondary | ICD-10-CM | POA: Diagnosis not present

## 2020-08-12 DIAGNOSIS — I129 Hypertensive chronic kidney disease with stage 1 through stage 4 chronic kidney disease, or unspecified chronic kidney disease: Secondary | ICD-10-CM | POA: Diagnosis present

## 2020-08-12 DIAGNOSIS — Z91030 Bee allergy status: Secondary | ICD-10-CM | POA: Diagnosis not present

## 2020-08-12 DIAGNOSIS — N1832 Chronic kidney disease, stage 3b: Secondary | ICD-10-CM | POA: Diagnosis not present

## 2020-08-12 DIAGNOSIS — D631 Anemia in chronic kidney disease: Secondary | ICD-10-CM | POA: Diagnosis present

## 2020-08-12 DIAGNOSIS — Z9049 Acquired absence of other specified parts of digestive tract: Secondary | ICD-10-CM | POA: Diagnosis not present

## 2020-08-12 DIAGNOSIS — K861 Other chronic pancreatitis: Secondary | ICD-10-CM | POA: Diagnosis not present

## 2020-08-12 DIAGNOSIS — Z79899 Other long term (current) drug therapy: Secondary | ICD-10-CM | POA: Diagnosis not present

## 2020-08-12 DIAGNOSIS — Z955 Presence of coronary angioplasty implant and graft: Secondary | ICD-10-CM | POA: Diagnosis not present

## 2020-08-12 HISTORY — DX: Acute pancreatitis without necrosis or infection, unspecified: K85.90

## 2020-08-12 LAB — CBC
HCT: 36.8 % — ABNORMAL LOW (ref 39.0–52.0)
Hemoglobin: 11.6 g/dL — ABNORMAL LOW (ref 13.0–17.0)
MCH: 29.3 pg (ref 26.0–34.0)
MCHC: 31.5 g/dL (ref 30.0–36.0)
MCV: 92.9 fL (ref 80.0–100.0)
Platelets: 421 10*3/uL — ABNORMAL HIGH (ref 150–400)
RBC: 3.96 MIL/uL — ABNORMAL LOW (ref 4.22–5.81)
RDW: 14.1 % (ref 11.5–15.5)
WBC: 12.2 10*3/uL — ABNORMAL HIGH (ref 4.0–10.5)
nRBC: 0 % (ref 0.0–0.2)

## 2020-08-12 LAB — COMPREHENSIVE METABOLIC PANEL
ALT: 12 U/L (ref 0–44)
AST: 12 U/L — ABNORMAL LOW (ref 15–41)
Albumin: 3.4 g/dL — ABNORMAL LOW (ref 3.5–5.0)
Alkaline Phosphatase: 94 U/L (ref 38–126)
Anion gap: 10 (ref 5–15)
BUN: 16 mg/dL (ref 8–23)
CO2: 28 mmol/L (ref 22–32)
Calcium: 9.9 mg/dL (ref 8.9–10.3)
Chloride: 100 mmol/L (ref 98–111)
Creatinine, Ser: 1.76 mg/dL — ABNORMAL HIGH (ref 0.61–1.24)
GFR, Estimated: 38 mL/min — ABNORMAL LOW (ref >60)
Glucose, Bld: 132 mg/dL — ABNORMAL HIGH (ref 70–99)
Potassium: 4.8 mmol/L (ref 3.5–5.1)
Sodium: 138 mmol/L (ref 135–145)
Total Bilirubin: 0.4 mg/dL (ref 0.3–1.2)
Total Protein: 7.9 g/dL (ref 6.5–8.1)

## 2020-08-12 LAB — LIPID PANEL
Cholesterol: 65 mg/dL (ref 0–200)
HDL: 27 mg/dL — ABNORMAL LOW (ref >40)
LDL Cholesterol: 23 mg/dL (ref 0–99)
Total CHOL/HDL Ratio: 2.4 RATIO
Triglycerides: 74 mg/dL (ref <150)
VLDL: 15 mg/dL (ref 0–40)

## 2020-08-12 LAB — PROTIME-INR
INR: 1.2 (ref 0.8–1.2)
Prothrombin Time: 15.3 seconds — ABNORMAL HIGH (ref 11.4–15.2)

## 2020-08-12 LAB — URINALYSIS, ROUTINE W REFLEX MICROSCOPIC
Bacteria, UA: NONE SEEN
Bilirubin Urine: NEGATIVE
Glucose, UA: NEGATIVE mg/dL
Hgb urine dipstick: NEGATIVE
Ketones, ur: NEGATIVE mg/dL
Leukocytes,Ua: NEGATIVE
Nitrite: NEGATIVE
Protein, ur: 100 mg/dL — AB
Specific Gravity, Urine: 1.006 (ref 1.005–1.030)
pH: 8 (ref 5.0–8.0)

## 2020-08-12 LAB — RESP PANEL BY RT-PCR (FLU A&B, COVID) ARPGX2
Influenza A by PCR: NEGATIVE
Influenza B by PCR: NEGATIVE
SARS Coronavirus 2 by RT PCR: NEGATIVE

## 2020-08-12 LAB — TROPONIN I (HIGH SENSITIVITY)
Troponin I (High Sensitivity): 5 ng/L (ref <18)
Troponin I (High Sensitivity): 5 ng/L (ref <18)

## 2020-08-12 LAB — GLUCOSE, CAPILLARY
Glucose-Capillary: 99 mg/dL (ref 70–99)
Glucose-Capillary: 91 mg/dL (ref 70–99)

## 2020-08-12 LAB — CBG MONITORING, ED: Glucose-Capillary: 106 mg/dL — ABNORMAL HIGH (ref 70–99)

## 2020-08-12 LAB — LIPASE, BLOOD: Lipase: 89 U/L — ABNORMAL HIGH (ref 11–51)

## 2020-08-12 MED ORDER — METOPROLOL TARTRATE 25 MG PO TABS
25.0000 mg | ORAL_TABLET | Freq: Every day | ORAL | Status: DC
  Administered 2020-08-12 – 2020-08-15 (×4): 25 mg via ORAL
  Filled 2020-08-12 (×4): qty 1

## 2020-08-12 MED ORDER — BUDESONIDE 0.25 MG/2ML IN SUSP
0.2500 mg | Freq: Every day | RESPIRATORY_TRACT | Status: DC
  Administered 2020-08-12: 0.25 mg via RESPIRATORY_TRACT
  Filled 2020-08-12: qty 2

## 2020-08-12 MED ORDER — IPRATROPIUM-ALBUTEROL 0.5-2.5 (3) MG/3ML IN SOLN
3.0000 mL | Freq: Four times a day (QID) | RESPIRATORY_TRACT | Status: DC
  Administered 2020-08-12 – 2020-08-15 (×14): 3 mL via RESPIRATORY_TRACT
  Filled 2020-08-12 (×13): qty 3

## 2020-08-12 MED ORDER — HYDROMORPHONE HCL 2 MG/ML IJ SOLN
2.0000 mg | INTRAMUSCULAR | Status: DC | PRN
  Administered 2020-08-12 – 2020-08-13 (×5): 2 mg via INTRAVENOUS
  Filled 2020-08-12 (×5): qty 1

## 2020-08-12 MED ORDER — CLOPIDOGREL BISULFATE 75 MG PO TABS
75.0000 mg | ORAL_TABLET | Freq: Every day | ORAL | Status: DC
  Administered 2020-08-12 – 2020-08-15 (×4): 75 mg via ORAL
  Filled 2020-08-12 (×4): qty 1

## 2020-08-12 MED ORDER — PRAVASTATIN SODIUM 40 MG PO TABS
40.0000 mg | ORAL_TABLET | Freq: Every day | ORAL | Status: DC
  Administered 2020-08-12 – 2020-08-14 (×3): 40 mg via ORAL
  Filled 2020-08-12 (×3): qty 1

## 2020-08-12 MED ORDER — LACTATED RINGERS IV SOLN
INTRAVENOUS | Status: DC
  Administered 2020-08-12: 1000 mL via INTRAVENOUS

## 2020-08-12 MED ORDER — ENOXAPARIN SODIUM 40 MG/0.4ML IJ SOSY
40.0000 mg | PREFILLED_SYRINGE | INTRAMUSCULAR | Status: DC
  Administered 2020-08-12 – 2020-08-15 (×4): 40 mg via SUBCUTANEOUS
  Filled 2020-08-12 (×4): qty 0.4

## 2020-08-12 MED ORDER — GABAPENTIN 300 MG PO CAPS
300.0000 mg | ORAL_CAPSULE | Freq: Two times a day (BID) | ORAL | Status: DC
  Administered 2020-08-12 – 2020-08-15 (×8): 300 mg via ORAL
  Filled 2020-08-12 (×8): qty 1

## 2020-08-12 MED ORDER — ENALAPRIL MALEATE 10 MG PO TABS
20.0000 mg | ORAL_TABLET | Freq: Every day | ORAL | Status: DC
  Administered 2020-08-12 – 2020-08-15 (×4): 20 mg via ORAL
  Filled 2020-08-12 (×4): qty 2

## 2020-08-12 MED ORDER — ENALAPRIL MALEATE 10 MG PO TABS: 20.0000 mg | ORAL_TABLET | Freq: Two times a day (BID) | ORAL | Status: DC

## 2020-08-12 MED ORDER — INSULIN ASPART 100 UNIT/ML IJ SOLN
0.0000 [IU] | Freq: Three times a day (TID) | INTRAMUSCULAR | Status: DC
  Administered 2020-08-14 – 2020-08-15 (×2): 2 [IU] via SUBCUTANEOUS
  Filled 2020-08-12: qty 0.15

## 2020-08-12 MED ORDER — GABAPENTIN 300 MG PO CAPS
600.0000 mg | ORAL_CAPSULE | Freq: Every day | ORAL | Status: DC
  Administered 2020-08-12 – 2020-08-14 (×3): 600 mg via ORAL
  Filled 2020-08-12 (×3): qty 2

## 2020-08-12 MED ORDER — HYDROMORPHONE HCL 1 MG/ML IJ SOLN
1.0000 mg | Freq: Once | INTRAMUSCULAR | Status: AC
  Administered 2020-08-12: 1 mg via INTRAVENOUS
  Filled 2020-08-12: qty 1

## 2020-08-12 MED ORDER — BUDESONIDE 0.25 MG/2ML IN SUSP
0.2500 mg | Freq: Every day | RESPIRATORY_TRACT | Status: DC
  Administered 2020-08-13 – 2020-08-14 (×2): 0.25 mg via RESPIRATORY_TRACT
  Filled 2020-08-12 (×2): qty 2

## 2020-08-12 MED ORDER — SODIUM CHLORIDE (PF) 0.9 % IJ SOLN
INTRAMUSCULAR | Status: AC
  Filled 2020-08-12: qty 50

## 2020-08-12 MED ORDER — IPRATROPIUM-ALBUTEROL 20-100 MCG/ACT IN AERS: 1.0000 | INHALATION_SPRAY | Freq: Four times a day (QID) | RESPIRATORY_TRACT | Status: DC

## 2020-08-12 MED ORDER — GABAPENTIN 300 MG PO CAPS: 300.0000 mg | ORAL_CAPSULE | ORAL | Status: DC

## 2020-08-12 MED ORDER — CLONIDINE HCL 0.1 MG PO TABS
0.1000 mg | ORAL_TABLET | Freq: Every day | ORAL | Status: DC
  Administered 2020-08-12 – 2020-08-15 (×4): 0.1 mg via ORAL
  Filled 2020-08-12 (×4): qty 1

## 2020-08-12 MED ORDER — AMLODIPINE BESYLATE 10 MG PO TABS
10.0000 mg | ORAL_TABLET | Freq: Every day | ORAL | Status: DC
  Administered 2020-08-12 – 2020-08-15 (×4): 10 mg via ORAL
  Filled 2020-08-12 (×3): qty 1
  Filled 2020-08-12: qty 2

## 2020-08-12 MED ORDER — HYDROMORPHONE HCL 1 MG/ML IJ SOLN
0.5000 mg | Freq: Once | INTRAMUSCULAR | Status: AC
Start: 2020-08-12 — End: 2020-08-12
  Administered 2020-08-12: 0.5 mg via INTRAVENOUS
  Filled 2020-08-12: qty 1

## 2020-08-12 MED ORDER — ASPIRIN EC 81 MG PO TBEC
81.0000 mg | DELAYED_RELEASE_TABLET | Freq: Every day | ORAL | Status: DC
  Administered 2020-08-12 – 2020-08-15 (×4): 81 mg via ORAL
  Filled 2020-08-12 (×4): qty 1

## 2020-08-12 MED ORDER — ENALAPRIL MALEATE 10 MG PO TABS
10.0000 mg | ORAL_TABLET | Freq: Every day | ORAL | Status: DC
  Administered 2020-08-12 – 2020-08-14 (×3): 10 mg via ORAL
  Filled 2020-08-12 (×3): qty 1

## 2020-08-12 MED ORDER — LEVOTHYROXINE SODIUM 50 MCG PO TABS
50.0000 ug | ORAL_TABLET | Freq: Every day | ORAL | Status: DC
  Administered 2020-08-12 – 2020-08-15 (×4): 50 ug via ORAL
  Filled 2020-08-12 (×4): qty 1

## 2020-08-12 MED ORDER — IOHEXOL 350 MG/ML SOLN
100.0000 mL | Freq: Once | INTRAVENOUS | Status: AC | PRN
  Administered 2020-08-12: 80 mL via INTRAVENOUS

## 2020-08-12 MED ORDER — MOMETASONE FUROATE 220 MCG/INH IN AEPB: 1.0000 | INHALATION_SPRAY | Freq: Every day | RESPIRATORY_TRACT | Status: DC

## 2020-08-12 NOTE — H&P (Addendum)
History and Physical    JAYSEAN MANVILLE KZS:[id number] DOB: [birth year] DOA: 08/11/2020  PCP: System, Provider Not In  Patient coming from: home   Chief Complaint: abdominal pain  HPI: Wayne Meadows is a 86 y.o. male with medical history significant for cops, cad, htn, ckd3, who presents with the above.  Two admissions last month. First admission for abd pain and nausea/vomiting. Had egd that hospitalization, then had outpatient EUS with biopsies of abnormal region of stomach and pancreatic tail, results neg for malignancy. Was also admitted later last month for chest pain with left heart cath showing stable 3 vessel disease.  Patient reports 3 days intense pain that was initially in chest but now is upper abdominal, primarily left upper abdomen but also epigastric and radiating to the back. No associated nausea or vomitintg. Patient denies heavy drinking or drug use, he is on chronic opioids for pain. Has hx cholecystectomy. Denies fever or cough or dysuria or hematuria.  ED Course:   Labs, imaging showing acute pancreatitis  Review of Systems: As per HPI otherwise 10 point review of systems negative.    Past Medical History:  Diagnosis Date   Arthritis    Carotid stenosis    COPD (chronic obstructive pulmonary disease) ([place])    Coronary artery disease    S/p PCI 2011;  NSTEMI 12/12:  LHC/PCI 02/23/11: LAD 60% after the septal perforator, D1 occluded with distal collaterals, proximal RI 30-40%, AV circumflex stent patent with 60% stenosis after the stent, RCA 99%, EF 60-65%.  His RCA was treated with a bare-metal stent   CVA (cerebral infarction) 2011   Right cerebral; total obstruction of the right ICA   Diverticulitis    Hypertension    Rectal bleeding 07/2015   Renal carcinoma (HCC)    Stroke ([place])    Tobacco abuse, in remission     Past Surgical History:  Procedure Laterality Date   COLON SURGERY     COLONOSCOPY N/A 08/22/2015   Procedure: COLONOSCOPY;  Surgeon:  [name], MD;  Location: [facility] ENDOSCOPY;  Service: Endoscopy;  Laterality: N/A;   ESOPHAGOGASTRODUODENOSCOPY N/A 07/18/2020   Procedure: ESOPHAGOGASTRODUODENOSCOPY (EGD);  Surgeon: Milus Banister, MD;  Location: [facility];  Service: Endoscopy;  Laterality: N/A;   ESOPHAGOGASTRODUODENOSCOPY (EGD) WITH PROPOFOL N/A 06/21/2020   Procedure: ESOPHAGOGASTRODUODENOSCOPY (EGD) WITH PROPOFOL;  Surgeon: Irene Shipper, MD;  Location: [facility] ENDOSCOPY;  Service: Endoscopy;  Laterality: N/A;   EUS N/A 07/18/2020   Procedure: UPPER ENDOSCOPIC ULTRASOUND (EUS) RADIAL;  Surgeon: Milus Banister, MD;  Location: [facility] ENDOSCOPY;  Service: Endoscopy;  Laterality: N/A;   FINE NEEDLE ASPIRATION N/A 07/18/2020   Procedure: FINE NEEDLE ASPIRATION (FNA) LINEAR;  Surgeon: Milus Banister, MD;  Location: [facility] ENDOSCOPY;  Service: Endoscopy;  Laterality: N/A;   hip relacement     KIDNEY SURGERY     LEFT HEART CATH AND CORONARY ANGIOGRAPHY N/A 07/09/2020   Procedure: LEFT HEART CATH AND CORONARY ANGIOGRAPHY;  Surgeon: Leonie Man, MD;  Location: [facility];  Service: Cardiovascular;  Laterality: N/A;   LEFT HEART CATHETERIZATION WITH CORONARY ANGIOGRAM N/A 02/23/2011   Procedure: LEFT HEART CATHETERIZATION WITH CORONARY ANGIOGRAM;  Surgeon: [name], MD;  Location: [facility] CATH LAB;  Service: Cardiovascular;  Laterality: N/A;   LEFT HEART CATHETERIZATION WITH CORONARY ANGIOGRAM N/A 03/18/2011   Procedure: LEFT HEART CATHETERIZATION WITH CORONARY ANGIOGRAM;  Surgeon: [name], MD;  Location: [facility] CATH LAB;  Service: Cardiovascular;  Laterality: N/A;  PERCUTANEOUS CORONARY STENT INTERVENTION (PCI-S) N/A 02/23/2011   Procedure: PERCUTANEOUS CORONARY STENT INTERVENTION (PCI-S);  Surgeon: [name], MD;  Location: [facility] CATH LAB;  Service: Cardiovascular;  Laterality: N/A;   TEMPORARY PACEMAKER INSERTION N/A 02/23/2011   Procedure: TEMPORARY PACEMAKER INSERTION;  Surgeon: [name], MD;  Location: [facility]  CATH LAB;  Service: Cardiovascular;  Laterality: N/A;     reports that he quit smoking about 13 years ago. His smoking use included cigarettes. He has never used smokeless tobacco. He reports previous alcohol use of about 1.0 standard drink of alcohol per week. He reports that he does not use drugs.  Allergies  Allergen Reactions   Bee Venom Anaphylaxis    Has epi pen   Influenza Vaccines Other (See Comments)    "Mortally sick for 2 weeks"    Family History  Problem Relation Age of Onset   Heart attack Other 66    Prior to Admission medications   Medication Sig Start Date End Date Taking? Authorizing Provider  amLODipine (NORVASC) 10 MG tablet Take 1 tablet (10 mg total) by mouth daily. 07/11/20  Yes Margie Billet, NP  aspirin EC 81 MG tablet Take 81 mg by mouth daily.   Yes [provider]  baclofen (LIORESAL) 10 MG tablet Take 10 mg by mouth at bedtime.   Yes [provider]  cloNIDine (CATAPRES) 0.1 MG tablet Take 1 tablet (0.1 mg total) by mouth daily. 06/24/20  Yes Aslam, Loralyn Freshwater, MD  clopidogrel (PLAVIX) 75 MG tablet Take 75 mg by mouth daily.   Yes [provider]  diclofenac Sodium (VOLTAREN) 1 % GEL Apply 2 g topically 4 (four) times daily. 06/23/20  Yes Aslam, Loralyn Freshwater, MD  enalapril (VASOTEC) 20 MG tablet Take 20 mg by mouth 2 (two) times daily. 1 whole tablet in the morning, and 1/2 tablet in the evenings   Yes [provider]  EPINEPHrine (EPI-PEN) 0.3 mg/0.3 mL DEVI Inject 0.3 mg into the muscle once as needed (for anaphylactic reaction).    Yes [provider]  ferrous sulfate 325 (65 FE) MG tablet Take 1 tablet by mouth. Every Monday, Wednesday, and Friday 07/30/20  Yes [provider]  gabapentin (NEURONTIN) 300 MG capsule Take 300-600 mg by mouth See admin instructions. Take 300 mg by mouth in the morning then 300 mg at noon then 600 mg at bedtime   Yes [provider]  HYDROcodone-acetaminophen (NORCO) 10-325 MG tablet  Take 1 tablet by mouth 2 (two) times daily.   Yes [provider]  Ipratropium-Albuterol (COMBIVENT) 20-100 MCG/ACT AERS respimat Inhale 1 puff into the lungs 4 (four) times daily.   Yes [provider]  levothyroxine (SYNTHROID) 50 MCG tablet Take 50 mcg by mouth daily before breakfast.   Yes [provider]  Melatonin 3 MG TABS Take 6 mg by mouth at bedtime.   Yes [provider]  metoprolol tartrate (LOPRESSOR) 25 MG tablet Take 25 mg by mouth daily.   Yes [provider]  mometasone (ASMANEX) 220 MCG/INH inhaler Inhale 1 puff into the lungs at bedtime.   Yes [provider]  Multiple Vitamin (MULTIVITAMIN) tablet Take 1 tablet by mouth daily.   Yes [provider]  nitroGLYCERIN (NITROSTAT) 0.4 MG SL tablet Place 0.4 mg under the tongue every 5 (five) minutes as needed for chest pain.   Yes [provider]  ondansetron (ZOFRAN) 4 MG tablet Take 1 tablet (4 mg total) by mouth every 6 (six) hours as  needed for nausea. 06/23/20  Yes Aslam, Loralyn Freshwater, MD  pantoprazole (PROTONIX) 40 MG tablet Take 1 tablet (40 mg total) by mouth 2 (two) times daily. 06/23/20  Yes Aslam, Loralyn Freshwater, MD  polyethylene glycol (MIRALAX / GLYCOLAX) packet Take 17 g by mouth daily. Patient taking differently: Take 17 g by mouth daily as needed for mild constipation or moderate constipation. 08/22/15  Yes Lavina Hamman, MD  pravastatin (PRAVACHOL) 40 MG tablet Take 40 mg by mouth at bedtime.   Yes [provider]  vitamin B-12 (CYANOCOBALAMIN) 500 MCG tablet Take 2 tablets by mouth every Monday, Wednesday, and Friday. 07/29/20  Yes [provider]  albuterol (VENTOLIN HFA) 108 (90 Base) MCG/ACT inhaler Inhale 1 puff into the lungs every 6 (six) hours as needed for shortness of breath.    [provider]    Physical Exam: Vitals:   08/12/20 0615 08/12/20 0630 08/12/20 0715 08/12/20 0730  BP: 130/78 (!) 144/77 (!) 167/72 (!) 185/71  Pulse:  70 72 74 75  Resp: 16 16 12 12   Temp:      TempSrc:      SpO2: 94% 94% 94% 95%  Weight:      Height:        Constitutional: appears in pain Head: Atraumatic Eyes: Conjunctiva clear ENM: Moist mucous membranes. Normal dentition.  Neck: Supple Respiratory: few scattered rhonchi Cardiovascular: Regular rate and rhythm. Soft systolic murmur Abdomen: ttp diffusely upper quadrants, no rebound Musculoskeletal: No joint deformity upper and lower extremities. Normal ROM, no contractures. Normal muscle tone.  Skin: No rashes, lesions, or ulcers.  Extremities: No peripheral edema. Palpable peripheral pulses. Neurologic: Alert, moving all 4 extremities. Psychiatric: Normal insight and judgement.   Labs on Admission: I have personally reviewed following labs and imaging studies  CBC: Recent Labs  Lab 08/12/20 0006  WBC 12.2*  HGB 11.6*  HCT 36.8*  MCV 92.9  PLT 127*   Basic Metabolic Panel: Recent Labs  Lab 08/12/20 0006  NA 138  K 4.8  CL 100  CO2 28  GLUCOSE 132*  BUN 16  CREATININE 1.76*  CALCIUM 9.9   GFR: Estimated Creatinine Clearance: 37 mL/min (A) (by C-G formula based on SCr of 1.76 mg/dL (H)). Liver Function Tests: Recent Labs  Lab 08/12/20 0006  AST 12*  ALT 12  ALKPHOS 94  BILITOT 0.4  PROT 7.9  ALBUMIN 3.4*   Recent Labs  Lab 08/12/20 0006  LIPASE 89*   No results for input(s): AMMONIA in the last 168 hours. Coagulation Profile: Recent Labs  Lab 08/12/20 0006  INR 1.2   Cardiac Enzymes: No results for input(s): CKTOTAL, CKMB, CKMBINDEX, TROPONINI in the last 168 hours. BNP (last 3 results) No results for input(s): PROBNP in the last 8760 hours. HbA1C: No results for input(s): HGBA1C in the last 72 hours. CBG: No results for input(s): GLUCAP in the last 168 hours. Lipid Profile: No results for input(s): CHOL, HDL, LDLCALC, TRIG, CHOLHDL, LDLDIRECT in the last 72 hours. Thyroid Function Tests: No results for input(s): TSH, T4TOTAL,  FREET4, T3FREE, THYROIDAB in the last 72 hours. Anemia Panel: No results for input(s): VITAMINB12, FOLATE, FERRITIN, TIBC, IRON, RETICCTPCT in the last 72 hours. Urine analysis:    Component Value Date/Time   COLORURINE YELLOW 06/20/2020 2236   APPEARANCEUR CLEAR 06/20/2020 2236   LABSPEC 1.013 06/20/2020 2236   PHURINE 7.0 06/20/2020 2236   GLUCOSEU 50 (A) 06/20/2020 2236   HGBUR SMALL (A) 06/20/2020 2236   BILIRUBINUR NEGATIVE 06/20/2020  [place] 5 (A) 06/20/2020 2236   PROTEINUR >=300 (A) 06/20/2020 2236   NITRITE NEGATIVE 06/20/2020 2236   LEUKOCYTESUR NEGATIVE 06/20/2020 2236    Radiological Exams on Admission: CT Angio Chest Pulmonary Embolism (PE) W or WO Contrast  Result Date: 08/12/2020 CLINICAL DATA:  Nonlocalized acute abdominal pain. EXAM: CT ANGIOGRAPHY CHEST CT ABDOMEN AND PELVIS WITH CONTRAST TECHNIQUE: Multidetector CT imaging of the chest was performed using the standard protocol during bolus administration of intravenous contrast. Multiplanar CT image reconstructions and MIPs were obtained to evaluate the vascular anatomy. Multidetector CT imaging of the abdomen and pelvis was performed using the standard protocol during bolus administration of intravenous contrast. CONTRAST:  45mL OMNIPAQUE IOHEXOL 350 MG/ML SOLN COMPARISON:  CT abdomen pelvis 06/20/2020, CT angio chest 07/08/2016 FINDINGS: CTA CHEST FINDINGS Cardiovascular: Satisfactory opacification of the pulmonary arteries to the segmental level. Unable to evaluate at the subsegmental level due to respiratory motion artifact (3: 209, 5:110). No evidence of pulmonary embolism. The main pulmonary artery is normal in caliber. Normal heart size. No pericardial effusion. The thoracic aorta is normal in caliber. Atherosclerotic plaque including at least 3 vessel coronary artery calcifications. Mediastinum/Nodes: No enlarged mediastinal, hilar, or axillary lymph nodes. Thyroid gland, trachea, and esophagus demonstrate no  significant findings. Lungs/Pleura: Biapical pleural/pulmonary scarring. Centrilobular emphysematous changes. No focal consolidation. Stable 4 mm subpleural nodule on the right (4:16). No new pulmonary nodule. No pulmonary mass. No pleural effusion. No pneumothorax. Diffuse bronchial wall thickening. Debris within the trachea and carina as well as the left mainstem bronchi. Musculoskeletal: No chest wall abnormality. No suspicious lytic or blastic osseous lesions. No acute displaced fracture. Multilevel degenerative changes of the spine. Review of the MIP images confirms the above findings. CT ABDOMEN and PELVIS FINDINGS Hepatobiliary: Similar-appearing peripheral enhancing 2.2 cm right hepatic lobe lesion on the pulmonary artery phase that fills in on the portal venous phase likely representing a hepatic hemangioma (3:296). Similar appearing finding measuring 0.7 cm (3:325). No focal liver abnormality. Status post cholecystectomy. No biliary dilatation. Pancreas: Calcifications throughout the pancreatic parenchyma consistent with chronic pancreatitis. Hazy pancreatic contour with associated peripancreatic fat stranding and trace free fluid along the distal pancreas. No definite pseudocyst formation. Question early organizing fluid collection along the greater curvature of the gastric lumen (7:67, 4:26). Spleen: Normal in size without focal abnormality. Adrenals/Urinary Tract: No adrenal nodule bilaterally. Bilateral kidneys enhance symmetrically. Several similar in size in appearance fluid density lesions within the kidneys likely represent simple renal cysts. Subcentimeter hypodensities are too small to characterize. No hydronephrosis. No hydroureter. The urinary bladder is distended with urine. Stomach/Bowel: Stomach is within normal limits. No evidence of bowel wall thickening or dilatation. Diffuse colonic diverticulosis. The appendix is not definitely identified. Vascular/Lymphatic: No splenic artery aneurysm  formation. No abdominal aorta or iliac aneurysm. Severe atherosclerotic plaque of the aorta and its branches. No abdominal, pelvic, or inguinal lymphadenopathy. Reproductive: Prostate is unremarkable. Other: No intraperitoneal free fluid. No intraperitoneal free gas. No organized fluid collection. Musculoskeletal: No abdominal wall hernia or abnormality. Diffusely decreased bone density. No suspicious lytic or blastic osseous lesions. No acute displaced fracture. Similar-appearing multilevel degenerative changes of the spine with posterolateral fusion and laminectomy of the L2 through L5 levels. Partially visualized total right hip arthroplasty. Review of the MIP images confirms the above findings. IMPRESSION: 1. Acute on chronic pancreatitis. No definite pseudocyst formation. 2. No central or segmental pulmonary embolus. Unable to evaluate at the subsegmental level due to respiratory motion artifact. 3.  Debris noted within the central airways as well as diffuse bronchial wall thickening. No new focal consolidation or finding of infection/inflammation within the lungs. 4. Diffuse colonic diverticulosis with no acute diverticulitis. 5. Two similar-appearing hepatic hemangioma. 6. Aortic Atherosclerosis (ICD10-I70.0) and Emphysema (ICD10-J43.9). 7. Status post cholecystectomy. Electronically Signed   By: Iven Finn M.D.   On: 08/12/2020 03:44   CT ABDOMEN PELVIS W CONTRAST  Result Date: 08/12/2020 CLINICAL DATA:  Nonlocalized acute abdominal pain. EXAM: CT ANGIOGRAPHY CHEST CT ABDOMEN AND PELVIS WITH CONTRAST TECHNIQUE: Multidetector CT imaging of the chest was performed using the standard protocol during bolus administration of intravenous contrast. Multiplanar CT image reconstructions and MIPs were obtained to evaluate the vascular anatomy. Multidetector CT imaging of the abdomen and pelvis was performed using the standard protocol during bolus administration of intravenous contrast. CONTRAST:  63mL  OMNIPAQUE IOHEXOL 350 MG/ML SOLN COMPARISON:  CT abdomen pelvis 06/20/2020, CT angio chest 07/08/2016 FINDINGS: CTA CHEST FINDINGS Cardiovascular: Satisfactory opacification of the pulmonary arteries to the segmental level. Unable to evaluate at the subsegmental level due to respiratory motion artifact (3: 209, 5:110). No evidence of pulmonary embolism. The main pulmonary artery is normal in caliber. Normal heart size. No pericardial effusion. The thoracic aorta is normal in caliber. Atherosclerotic plaque including at least 3 vessel coronary artery calcifications. Mediastinum/Nodes: No enlarged mediastinal, hilar, or axillary lymph nodes. Thyroid gland, trachea, and esophagus demonstrate no significant findings. Lungs/Pleura: Biapical pleural/pulmonary scarring. Centrilobular emphysematous changes. No focal consolidation. Stable 4 mm subpleural nodule on the right (4:16). No new pulmonary nodule. No pulmonary mass. No pleural effusion. No pneumothorax. Diffuse bronchial wall thickening. Debris within the trachea and carina as well as the left mainstem bronchi. Musculoskeletal: No chest wall abnormality. No suspicious lytic or blastic osseous lesions. No acute displaced fracture. Multilevel degenerative changes of the spine. Review of the MIP images confirms the above findings. CT ABDOMEN and PELVIS FINDINGS Hepatobiliary: Similar-appearing peripheral enhancing 2.2 cm right hepatic lobe lesion on the pulmonary artery phase that fills in on the portal venous phase likely representing a hepatic hemangioma (3:296). Similar appearing finding measuring 0.7 cm (3:325). No focal liver abnormality. Status post cholecystectomy. No biliary dilatation. Pancreas: Calcifications throughout the pancreatic parenchyma consistent with chronic pancreatitis. Hazy pancreatic contour with associated peripancreatic fat stranding and trace free fluid along the distal pancreas. No definite pseudocyst formation. Question early organizing  fluid collection along the greater curvature of the gastric lumen (7:67, 4:26). Spleen: Normal in size without focal abnormality. Adrenals/Urinary Tract: No adrenal nodule bilaterally. Bilateral kidneys enhance symmetrically. Several similar in size in appearance fluid density lesions within the kidneys likely represent simple renal cysts. Subcentimeter hypodensities are too small to characterize. No hydronephrosis. No hydroureter. The urinary bladder is distended with urine. Stomach/Bowel: Stomach is within normal limits. No evidence of bowel wall thickening or dilatation. Diffuse colonic diverticulosis. The appendix is not definitely identified. Vascular/Lymphatic: No splenic artery aneurysm formation. No abdominal aorta or iliac aneurysm. Severe atherosclerotic plaque of the aorta and its branches. No abdominal, pelvic, or inguinal lymphadenopathy. Reproductive: Prostate is unremarkable. Other: No intraperitoneal free fluid. No intraperitoneal free gas. No organized fluid collection. Musculoskeletal: No abdominal wall hernia or abnormality. Diffusely decreased bone density. No suspicious lytic or blastic osseous lesions. No acute displaced fracture. Similar-appearing multilevel degenerative changes of the spine with posterolateral fusion and laminectomy of the L2 through L5 levels. Partially visualized total right hip arthroplasty. Review of the MIP images confirms the above findings. IMPRESSION: 1. Acute on chronic  pancreatitis. No definite pseudocyst formation. 2. No central or segmental pulmonary embolus. Unable to evaluate at the subsegmental level due to respiratory motion artifact. 3. Debris noted within the central airways as well as diffuse bronchial wall thickening. No new focal consolidation or finding of infection/inflammation within the lungs. 4. Diffuse colonic diverticulosis with no acute diverticulitis. 5. Two similar-appearing hepatic hemangioma. 6. Aortic Atherosclerosis (ICD10-I70.0) and  Emphysema (ICD10-J43.9). 7. Status post cholecystectomy. Electronically Signed   By: Iven Finn M.D.   On: 08/12/2020 03:44     Assessment/Plan Principal Problem:   Acute pancreatitis Active Problems:   Coronary artery disease due to lipid rich plaque   COPD (chronic obstructive pulmonary disease) (HCC)   Hypertension   Atrial fibrillation (HCC)   COPD exacerbation (HCC)   CKD (chronic kidney disease), stage III ([place])   # Acute pancreatitis Recent u/s showing signs chronic pancreatitis though pt denies hx of pancreatitis. No heavy drinking. Is s/p cholecystectomy, no stone seen on CT. Did recently have biopsy of abnormal appearing tail of pancreas (path showed atypical cells favored to be inflammatory), question whether this could be complication of that procedure. Here lipase mildly elevated, ct showing acute on chronic pancreatitis. No necrosis seen. Hemodynamically stable. - npo - LR - dilaudid for pain - f/u triglycerides - gi consult given recent procedure  # COPD Does not appear exacerbated - cont home mometasone, combivent  # Chronic pain - dilaudid as above, hold home oxy - cont home gabapentin  # HTN Here bp elevated - resume home meds: amlodipine, clonidine, enelapril, metoprolol  # CAD Recent LHC w/ stable 3 v disease. Here upper abd pain but troponins wnl x2 - cont home statin, asa, plavix  # hypothyroid - cont home levothyroxine  # T2DM A1c 7.2 last month, appears to be new dx - SSI   # CKD3 Cr 1.76 appears to be baseline - monitor   DVT prophylaxis: lovenox Code Status: full  Family Communication: none @ bedside   Consults called: [facility]   Level of care: Telemetry Status is: Inpatient  Remains inpatient appropriate because:Inpatient level of care appropriate due to severity of illness  Dispo: The patient is from: Home              Anticipated d/c is to: Home              Patient currently is not medically stable to d/c.   Difficult  to place patient No        Desma Maxim MD [facility] Pager [phone]  If 7PM-7AM, please contact night-coverage [email or web address] Password [facility]  08/12/2020, 7:51 AM

## 2020-08-12 NOTE — ED Provider Notes (Signed)
[facility] Emergency Department Provider Note MRN:  [id number]  Arrival date & time: 08/12/20     Chief Complaint   Chest Pain   History of Present Illness   Wayne Meadows is a 86 y.o. year-old male with a history of CAD presenting to the ED with chief complaint of chest pain.  Patient is endorsing central chest pain as well as left upper quadrant/left flank pain.  Present for the past 5 days.  Explains that he had a biopsy of his pancreas and the next day he was lifting some heavy items at home and experienced some pain in this area.  Seems to have worsened since then.  Pain described as a sharp and/or pressure pain, moderate to severe at this time.  Denies dizziness or diaphoresis, no nausea vomiting, no trouble breathing.  No numbness or weakness to the arms or legs.  Review of Systems  A complete 10 system review of systems was obtained and all systems are negative except as noted in the HPI and PMH.   Patient's Health History    Past Medical History:  Diagnosis Date   Arthritis    Carotid stenosis    COPD (chronic obstructive pulmonary disease) ([place])    Coronary artery disease    S/p PCI 2011;  NSTEMI 12/12:  LHC/PCI 02/23/11: LAD 60% after the septal perforator, D1 occluded with distal collaterals, proximal RI 30-40%, AV circumflex stent patent with 60% stenosis after the stent, RCA 99%, EF 60-65%.  His RCA was treated with a bare-metal stent   CVA (cerebral infarction) 2011   Right cerebral; total obstruction of the right ICA   Diverticulitis    Hypertension    Rectal bleeding 07/2015   Renal carcinoma (HCC)    Stroke ([place])    Tobacco abuse, in remission     Past Surgical History:  Procedure Laterality Date   COLON SURGERY     COLONOSCOPY N/A 08/22/2015   Procedure: COLONOSCOPY;  Surgeon: [name], MD;  Location: [facility] ENDOSCOPY;  Service: Endoscopy;  Laterality: N/A;   ESOPHAGOGASTRODUODENOSCOPY N/A 07/18/2020   Procedure:  ESOPHAGOGASTRODUODENOSCOPY (EGD);  Surgeon: Milus Banister, MD;  Location: [facility];  Service: Endoscopy;  Laterality: N/A;   ESOPHAGOGASTRODUODENOSCOPY (EGD) WITH PROPOFOL N/A 06/21/2020   Procedure: ESOPHAGOGASTRODUODENOSCOPY (EGD) WITH PROPOFOL;  Surgeon: Irene Shipper, MD;  Location: [facility] ENDOSCOPY;  Service: Endoscopy;  Laterality: N/A;   EUS N/A 07/18/2020   Procedure: UPPER ENDOSCOPIC ULTRASOUND (EUS) RADIAL;  Surgeon: Milus Banister, MD;  Location: [facility] ENDOSCOPY;  Service: Endoscopy;  Laterality: N/A;   FINE NEEDLE ASPIRATION N/A 07/18/2020   Procedure: FINE NEEDLE ASPIRATION (FNA) LINEAR;  Surgeon: Milus Banister, MD;  Location: [facility] ENDOSCOPY;  Service: Endoscopy;  Laterality: N/A;   hip relacement     KIDNEY SURGERY     LEFT HEART CATH AND CORONARY ANGIOGRAPHY N/A 07/09/2020   Procedure: LEFT HEART CATH AND CORONARY ANGIOGRAPHY;  Surgeon: Leonie Man, MD;  Location: [facility];  Service: Cardiovascular;  Laterality: N/A;   LEFT HEART CATHETERIZATION WITH CORONARY ANGIOGRAM N/A 02/23/2011   Procedure: LEFT HEART CATHETERIZATION WITH CORONARY ANGIOGRAM;  Surgeon: [name], MD;  Location: [facility] CATH LAB;  Service: Cardiovascular;  Laterality: N/A;   LEFT HEART CATHETERIZATION WITH CORONARY ANGIOGRAM N/A 03/18/2011   Procedure: LEFT HEART CATHETERIZATION WITH CORONARY ANGIOGRAM;  Surgeon: [name], MD;  Location: [facility] CATH LAB;  Service: Cardiovascular;  Laterality: N/A;   PERCUTANEOUS CORONARY STENT INTERVENTION (PCI-S)  N/A 02/23/2011   Procedure: PERCUTANEOUS CORONARY STENT INTERVENTION (PCI-S);  Surgeon: [name], MD;  Location: [facility] CATH LAB;  Service: Cardiovascular;  Laterality: N/A;   TEMPORARY PACEMAKER INSERTION N/A 02/23/2011   Procedure: TEMPORARY PACEMAKER INSERTION;  Surgeon: [name], MD;  Location: [facility] C[facility] LAB;  Service: Cardiovascular;  Laterality: N/A;    Family History  Problem Relation Age of Onset   Heart attack Other 43    Social  History   Socioeconomic History   Marital status: Divorced    Spouse name: Not on file   Number of children: Not on file   Years of education: Not on file   Highest education level: Not on file  Occupational History   Not on file  Tobacco Use   Smoking status: Former    Pack years: 0.00    Types: Cigarettes    Quit date: 02/24/2007    Years since quitting: 13.4   Smokeless tobacco: Never  Substance and Sexual Activity   Alcohol use: Not Currently    Alcohol/week: 1.0 standard drink    Types: 1 Standard drinks or equivalent per week    Comment: socially    Drug use: No   Sexual activity: Not on file  Other Topics Concern   Not on file  Social History Narrative   Not on file   Social Determinants of Health   Financial Resource Strain: Not on file  Food Insecurity: Not on file  Transportation Needs: Not on file  Physical Activity: Not on file  Stress: Not on file  Social Connections: Not on file  Intimate Partner Violence: Not on file     Physical Exam   Vitals:   08/12/20 0515 08/12/20 0600  BP: (!) 160/67 (!) 153/66  Pulse: 72 73  Resp: 13 11  Temp:    SpO2: 92% 90%    CONSTITUTIONAL: Well-appearing, NAD NEURO:  Alert and oriented x 3, no focal deficits EYES:  eyes equal and reactive ENT/NECK:  no LAD, no JVD CARDIO: Regular rate, well-perfused, normal S1 and S2 PULM:  CTAB no wheezing or rhonchi GI/GU:  normal bowel sounds, non-distended, non-tender MSK/SPINE:  No gross deformities, no edema SKIN:  no rash, atraumatic PSYCH:  Appropriate speech and behavior  *Additional and/or pertinent findings included in MDM below  Diagnostic and Interventional Summary    EKG Interpretation  Date/Time:  August 11, 2020 at 23: 39: 51 Ventricular Rate:  77 PR Interval:    QRS Duration: 97 QT Interval:  396 QTC Calculation: 449 R Axis:     Text Interpretation: Sinus rhythm, no ischemic findings Confirm by Dr. Gerlene Fee at [place] Reviewed  CBC  - Abnormal; Notable for the following components:      Result Value   WBC 12.2 (*)    RBC 3.96 (*)    Hemoglobin 11.6 (*)    HCT 36.8 (*)    Platelets 421 (*)    All other components within normal limits  COMPREHENSIVE METABOLIC PANEL - Abnormal; Notable for the following components:   Glucose, Bld 132 (*)    Creatinine, Ser 1.76 (*)    Albumin 3.4 (*)    AST 12 (*)    GFR, Estimated 38 (*)    All other components within normal limits  LIPASE, BLOOD - Abnormal; Notable for the following components:   Lipase 89 (*)    All other components within normal limits  PROTIME-INR - Abnormal; Notable for  the following components:   Prothrombin Time 15.3 (*)    All other components within normal limits  RESP PANEL BY RT-PCR (FLU A&B, COVID) ARPGX2  TROPONIN I (HIGH SENSITIVITY)  TROPONIN I (HIGH SENSITIVITY)    CT Angio Chest Pulmonary Embolism (PE) W or WO Contrast  Final Result    CT ABDOMEN PELVIS W CONTRAST  Final Result      Medications  sodium chloride (PF) 0.9 % injection (has no administration in time range)  HYDROmorphone (DILAUDID) injection 0.5 mg (0.5 mg Intravenous Given 08/12/20 0025)  iohexol (OMNIPAQUE) 350 MG/ML injection 100 mL (80 mLs Intravenous Contrast Given 08/12/20 0237)  HYDROmorphone (DILAUDID) injection 1 mg (1 mg Intravenous Given 08/12/20 0427)     Procedures  /  Critical Care Procedures  ED Course and Medical Decision Making  I have reviewed the triage vital signs, the nursing notes, and pertinent available records from the EMR.  Listed above are laboratory and imaging tests that I personally ordered, reviewed, and interpreted and then considered in my medical decision making (see below for details).  Considering ACS given patient's extensive cardiac history.  Also considering bleeding from recent biopsy, post procedure pancreatitis also consideration.  PE should also be considered.  Awaiting labs, CT imaging.     Labs reveal elevated lipase, troponin  is negative x2.  CT showing acute on chronic pancreatitis.  Patient still in a great amount of pain, providing further pain control, will admit to medicine.  Barth Kirks. Sedonia Small, [place] [email or web address]  Final Clinical Impressions(s) / ED Diagnoses     ICD-10-CM   1. Acute pancreatitis, unspecified complication status, unspecified pancreatitis type  K85.90       ED Discharge Orders     None        Discharge Instructions Discussed with and Provided to Patient:   Discharge Instructions   None       Maudie Flakes, MD 08/12/20 [phone]

## 2020-08-12 NOTE — Consult Note (Signed)
Consultation  Referring Provider: Dr. Si Raider    Primary Care Physician:  System, Provider Not In Primary Gastroenterologist:    Dr. Silverio Decamp     Reason for Consultation:   Acute on chronic pancreatitis         HPI:   Wayne Meadows is a 86 y.o. male with a past medical history of partial colectomy following colon perforation from a colonoscopy in 2013 (not [facility]) status post partial colectomy, COPD, CAD, CVA and renal carcinoma as well as stroke on Plavix, who presented to the hospital with epigastric pain and found to have acute on chronic pancreatitis.    06/20/2020 admission for nausea and vomiting.  Abnormal CT showing some gastric wall thickening.  EGD performed which was essentially normal.  EUS recommended.    07/09/2020 patient had a cardiac cath with stent placed.    07/18/2020 EUS as below.    Today, the patient tells me that he has really been having pain ever since the time of his EUS.  This seems to have increased over the past 2 to 3 days.  Tells me that just after the procedure he was moving a cooler from one car to the other and felt a severe left upper quadrant/left side pain which felt like a "pull", this seemed to get slightly better but then 2 days ago this pain came back as well as some epigastric pain.  Apparently he used nitro "3 sets of 3", which seemed to work initially and then stopped working which made him present to the ER.  Tells me this left upper quadrant pain sometimes radiates into the top part of his stomach and is more of "a burning".  Associated symptoms include nausea but no vomiting.  He tells me he has been eating well at home and actually gaining weight.  Continues his Protonix 40 mg twice daily and has no symptoms of reflux.  Patient was unaware of results from EUS when asked today.  Describes very normal stools over the past couple of weeks.    Does admit to occasional alcohol use, "a nice red wine with the right meal", or "a beer with a pizza".  This  is not every night for him.    Denies fever, chills or change in bowel habits.  ER course:   CT abdomen pelvis with contrast with acute on chronic pancreatitis, no definite pseudocyst formation, no central or segmental pulmonary embolus, debris noted within the central airways as well as diffuse bronchial wall thickening, diffuse colonic diverticulosis with no acute diverticulitis, 2 similar-appearing hepatic hemangiomas, aortic atherosclerosis and status post cholecystectomy     GI history: 07/18/2020 EUS with very abnormal proximal gastric wall and nearby tail of pancreas, clear changes of chronic pancreatitis (calcifications in the tail), intramural gastric (lesions) may represent inflammatory changes (intramural pseudocyst), 8 cc of milky, yellowish fluid was aspirated from the gastric wall "lesions", sent for cytology and micro work-up, started on Cipro 500 twice daily for 7 days on chance that this is infectious, abnormal tail the pancreas also sampled by FNA, clear macroscopic changes of chronic pancreatitis; pathology showed atypical cells present in acute inflammation.  At that time followed up he completed Cipro x7 days and felt completely fine, was feeling fine for 1 to 2 weeks prior to the procedure as well, not losing any more weight-plan at that time to repeat CT in 2 months-noted that if changes were much improved and it was almost certainly not a cancer, if  changes were the same or worsening should have further testing (probably repeat EUS) 06/21/2020 EGD: Incidental esophageal stricture, some congestion in the lower esophagus and otherwise normal, EUS recommended CTAP without contrast 06/20/2020: Irregular gastric wall thickening at the cardia and greater curvature, differential included neoplasm, PUD or less likely focal gastritis-significant adjacent left upper quadrant inflammation with no perforation, inflammation about the tail of the pancreas but appeared to be gastric in origin rather  than.  Pancreatic 08/2015 colonoscopy: Evaluation of GI bleeding with inadequate bowel prep, polyps removed from rectum and transverse colon, sigmoid colon granularity, diverticulosis in the sigmoid, descending and transverse colon as well as nonbleeding internal hemorrhoids, pathology with tubular adenomas and hyperplastic, sigmoid biopsies with chronic active colitis  Past Medical History:  Diagnosis Date  . Arthritis   . Carotid stenosis   . COPD (chronic obstructive pulmonary disease) ([place])   . Coronary artery disease    S/p PCI 2011;  NSTEMI 12/12:  LHC/PCI 02/23/11: LAD 60% after the septal perforator, D1 occluded with distal collaterals, proximal RI 30-40%, AV circumflex stent patent with 60% stenosis after the stent, RCA 99%, EF 60-65%.  His RCA was treated with a bare-metal stent  . CVA (cerebral infarction) 2011   Right cerebral; total obstruction of the right ICA  . Diverticulitis   . Hypertension   . Rectal bleeding 07/2015  . Renal carcinoma ([place])   . Stroke ([place])   . Tobacco abuse, in remission     Past Surgical History:  Procedure Laterality Date  . COLON SURGERY    . COLONOSCOPY N/A 08/22/2015   Procedure: COLONOSCOPY;  Surgeon: [name], MD;  Location: [facility] ENDOSCOPY;  Service: Endoscopy;  Laterality: N/A;  . ESOPHAGOGASTRODUODENOSCOPY N/A 07/18/2020   Procedure: ESOPHAGOGASTRODUODENOSCOPY (EGD);  Surgeon: Milus Banister, MD;  Location: [facility];  Service: Endoscopy;  Laterality: N/A;  . ESOPHAGOGASTRODUODENOSCOPY (EGD) WITH PROPOFOL N/A 06/21/2020   Procedure: ESOPHAGOGASTRODUODENOSCOPY (EGD) WITH PROPOFOL;  Surgeon: Irene Shipper, MD;  Location[facility] ENDOSCOPY;  Service: Endoscopy;  Laterality: N/A;  . EUS N/A 07/18/2020   Procedure: UPPER ENDOSCOPIC ULTRASOUND (EUS) RADIAL;  Surgeon: Milus Banister, MD;  Location: [facility] ENDOSCOPY;  Service: Endoscopy;  Laterality: N/A;  . FINE NEEDLE ASPIRATION N/A 07/18/2020   Procedure: FINE NEEDLE ASPIRATION (FNA) LINEAR;   Surgeon: Milus Banister, MD;  Location: [facility] ENDOSCOPY;  Service: Endoscopy;  Laterality: N/A;  . hip relacement    . KIDNEY SURGERY    . LEFT HEART CATH AND CORONARY ANGIOGRAPHY N/A 07/09/2020   Procedure: LEFT HEART CATH AND CORONARY ANGIOGRAPHY;  Surgeon: Leonie Man, MD;  Location: [facility];  Service: Cardiovascular;  Laterality: N/A;  . LEFT HEART CATHETERIZATION WITH CORONARY ANGIOGRAM N/A 02/23/2011   Procedure: LEFT HEART CATHETERIZATION WITH CORONARY ANGIOGRAM;  Surgeon: [name], MD;  Location: [facility] CATH LAB;  Service: Cardiovascular;  Laterality: N/A;  . LEFT HEART CATHETERIZATION WITH CORONARY ANGIOGRAM N/A 03/18/2011   Procedure: LEFT HEART CATHETERIZATION WITH CORONARY ANGIOGRAM;  Surgeon: [name], MD;  Location: [facility] CATH LAB;  Service: Cardiovascular;  Laterality: N/A;  . PERCUTANEOUS CORONARY STENT INTERVENTION (PCI-S) N/A 02/23/2011   Procedure: PERCUTANEOUS CORONARY STENT INTERVENTION (PCI-S);  Surgeon: [name], MD;  Location: [facility] CATH LAB;  Service: Cardiovascular;  Laterality: N/A;  . TEMPORARY PACEMAKER INSERTION N/A 02/23/2011   Procedure: TEMPORARY PACEMAKER INSERTION;  Surgeon: [name], MD;  Location: [facility] CATH LAB;  Service: Cardiovascular;  Laterality: N/A;    Family History  Problem Relation Age of Onset  . Heart attack Other 3    Social History   Tobacco Use  . Smoking status: Former    Pack years: 0.00    Types: Cigarettes    Quit date: 02/24/2007    Years since quitting: 13.4  . Smokeless tobacco: Never  Substance Use Topics  . Alcohol use: Not Currently    Alcohol/week: 1.0 standard drink    Types: 1 Standard drinks or equivalent per week    Comment: socially   . Drug use: No    Prior to Admission medications   Medication Sig Start Date End Date Taking? Authorizing Provider  amLODipine (NORVASC) 10 MG tablet Take 1 tablet (10 mg total) by mouth daily. 07/11/20  Yes Margie Billet, NP  aspirin EC 81 MG tablet Take 81  mg by mouth daily.   Yes [provider]  baclofen (LIORESAL) 10 MG tablet Take 10 mg by mouth at bedtime.   Yes [provider]  cloNIDine (CATAPRES) 0.1 MG tablet Take 1 tablet (0.1 mg total) by mouth daily. 06/24/20  Yes Aslam, Loralyn Freshwater, MD  clopidogrel (PLAVIX) 75 MG tablet Take 75 mg by mouth daily.   Yes [provider]  diclofenac Sodium (VOLTAREN) 1 % GEL Apply 2 g topically 4 (four) times daily. 06/23/20  Yes Aslam, Loralyn Freshwater, MD  enalapril (VASOTEC) 20 MG tablet Take 20 mg by mouth 2 (two) times daily. 1 whole tablet in the morning, and 1/2 tablet in the evenings   Yes [provider]  EPINEPHrine (EPI-PEN) 0.3 mg/0.3 mL DEVI Inject 0.3 mg into the muscle once as needed (for anaphylactic reaction).    Yes [provider]  ferrous sulfate 325 (65 FE) MG tablet Take 1 tablet by mouth. Every Monday, Wednesday, and Friday 07/30/20  Yes [provider]  gabapentin (NEURONTIN) 300 MG capsule Take 300-600 mg by mouth See admin instructions. Take 300 mg by mouth in the morning then 300 mg at noon then 600 mg at bedtime   Yes [provider]  HYDROcodone-acetaminophen (NORCO) 10-325 MG tablet Take 1 tablet by mouth 2 (two) times daily.   Yes [provider]  Ipratropium-Albuterol (COMBIVENT) 20-100 MCG/ACT AERS respimat Inhale 1 puff into the lungs 4 (four) times daily.   Yes [provider]  levothyroxine (SYNTHROID) 50 MCG tablet Take 50 mcg by mouth daily before breakfast.   Yes [provider]  Melatonin 3 MG TABS Take 6 mg by mouth at bedtime.   Yes [provider]  metoprolol tartrate (LOPRESSOR) 25 MG tablet Take 25 mg by mouth daily.   Yes [provider]  mometasone (ASMANEX) 220 MCG/INH inhaler Inhale 1 puff into the lungs at bedtime.   Yes [provider]  Multiple Vitamin (MULTIVITAMIN) tablet Take 1 tablet by mouth daily.   Yes [provider]  nitroGLYCERIN (NITROSTAT) 0.4  MG SL tablet Place 0.4 mg under the tongue every 5 (five) minutes as needed for chest pain.   Yes [provider]  ondansetron (ZOFRAN) 4 MG tablet Take 1 tablet (4 mg total) by mouth every 6 (six) hours as needed for nausea. 06/23/20  Yes Aslam, Loralyn Freshwater, MD  pantoprazole (PROTONIX) 40 MG tablet Take 1 tablet (40 mg total) by mouth 2 (two) times daily. 06/23/20  Yes Aslam, Loralyn Freshwater, MD  polyethylene glycol (MIRALAX / GLYCOLAX) packet Take 17 g by mouth daily. Patient taking differently: Take 17 g by mouth daily as needed for mild constipation or moderate constipation.  08/22/15  Yes Lavina Hamman, MD  pravastatin (PRAVACHOL) 40 MG tablet Take 40 mg by mouth at bedtime.   Yes [provider]  vitamin B-12 (CYANOCOBALAMIN) 500 MCG tablet Take 2 tablets by mouth every Monday, Wednesday, and Friday. 07/29/20  Yes [provider]  albuterol (VENTOLIN HFA) 108 (90 Base) MCG/ACT inhaler Inhale 1 puff into the lungs every 6 (six) hours as needed for shortness of breath.    [provider]    Current Facility-Administered Medications  Medication Dose Route Frequency Provider Last Rate Last Admin  . amLODipine (NORVASC) tablet 10 mg  10 mg Oral Daily Wouk, Ailene Rud, MD      . aspirin EC tablet 81 mg  81 mg Oral Daily Wouk, Ailene Rud, MD      . cloNIDine (CATAPRES) tablet 0.1 mg  0.1 mg Oral Daily Wouk, Ailene Rud, MD      . clopidogrel (PLAVIX) tablet 75 mg  75 mg Oral Daily Wouk, Ailene Rud, MD      . enalapril (VASOTEC) tablet 20 mg  20 mg Oral Daily Wouk, Ailene Rud, MD       And  . enalapril (VASOTEC) tablet 10 mg  10 mg Oral QHS Wouk, Ailene Rud, MD      . enoxaparin (LOVENOX) injection 40 mg  40 mg Subcutaneous Q24H Wouk, Ailene Rud, MD      . gabapentin (NEURONTIN) capsule 300 mg  300 mg Oral BID WC Wouk, Ailene Rud, MD       And  . gabapentin (NEURONTIN) capsule 600 mg  600 mg Oral QHS Wouk, Ailene Rud, MD      . HYDROmorphone (DILAUDID) injection 2  mg  2 mg Intravenous Q3H PRN Gwynne Edinger, MD   2 mg at 08/12/20 0836  . insulin aspart (novoLOG) injection 0-15 Units  0-15 Units Subcutaneous TID WC Wouk, Ailene Rud, MD      . ipratropium-albuterol (DUONEB) 0.5-2.5 (3) MG/3ML nebulizer solution 3 mL  3 mL Nebulization QID Wouk, Ailene Rud, MD      . lactated ringers infusion   Intravenous Continuous Wouk, Ailene Rud, MD      . levothyroxine (SYNTHROID) tablet 50 mcg  50 mcg Oral QAC breakfast Wouk, Ailene Rud, MD      . metoprolol tartrate (LOPRESSOR) tablet 25 mg  25 mg Oral Daily Wouk, Ailene Rud, MD      . mometasone [facility]) inhaler 1 puff  1 puff Inhalation QHS Wouk, Ailene Rud, MD      . pravastatin (PRAVACHOL) tablet 40 mg  40 mg Oral QHS Wouk, Ailene Rud, MD       Current Outpatient Medications  Medication Sig Dispense Refill  . amLODipine (NORVASC) 10 MG tablet Take 1 tablet (10 mg total) by mouth daily. 90 tablet 1  . aspirin EC 81 MG tablet Take 81 mg by mouth daily.    . baclofen (LIORESAL) 10 MG tablet Take 10 mg by mouth at bedtime.    . cloNIDine (CATAPRES) 0.1 MG tablet Take 1 tablet (0.1 mg total) by mouth daily. 30 tablet 0  . clopidogrel (PLAVIX) 75 MG tablet Take 75 mg by mouth daily.    . diclofenac Sodium (VOLTAREN) 1 % GEL Apply 2 g topically 4 (four) times daily. 150 g 0  . enalapril (VASOTEC) 20 MG tablet Take 20 mg by mouth 2 (two) times daily. 1 whole tablet in the morning, and 1/2 tablet in the evenings    . EPINEPHrine (EPI-PEN)  0.3 mg/0.3 mL DEVI Inject 0.3 mg into the muscle once as needed (for anaphylactic reaction).     . ferrous sulfate 325 (65 FE) MG tablet Take 1 tablet by mouth. Every Monday, Wednesday, and Friday    . gabapentin (NEURONTIN) 300 MG capsule Take 300-600 mg by mouth See admin instructions. Take 300 mg by mouth in the morning then 300 mg at noon then 600 mg at bedtime    . HYDROcodone-acetaminophen (NORCO) 10-325 MG tablet Take 1 tablet by mouth 2 (two) times daily.    .  Ipratropium-Albuterol (COMBIVENT) 20-100 MCG/ACT AERS respimat Inhale 1 puff into the lungs 4 (four) times daily.    Marland Kitchen levothyroxine (SYNTHROID) 50 MCG tablet Take 50 mcg by mouth daily before breakfast.    . Melatonin 3 MG TABS Take 6 mg by mouth at bedtime.    . metoprolol tartrate (LOPRESSOR) 25 MG tablet Take 25 mg by mouth daily.    . mometasone (ASMANEX) 220 MCG/INH inhaler Inhale 1 puff into the lungs at bedtime.    . Multiple Vitamin (MULTIVITAMIN) tablet Take 1 tablet by mouth daily.    . nitroGLYCERIN (NITROSTAT) 0.4 MG SL tablet Place 0.4 mg under the tongue every 5 (five) minutes as needed for chest pain.    Marland Kitchen ondansetron (ZOFRAN) 4 MG tablet Take 1 tablet (4 mg total) by mouth every 6 (six) hours as needed for nausea. 20 tablet 0  . pantoprazole (PROTONIX) 40 MG tablet Take 1 tablet (40 mg total) by mouth 2 (two) times daily. 60 tablet 0  . polyethylene glycol (MIRALAX / GLYCOLAX) packet Take 17 g by mouth daily. (Patient taking differently: Take 17 g by mouth daily as needed for mild constipation or moderate constipation.) 14 each 0  . pravastatin (PRAVACHOL) 40 MG tablet Take 40 mg by mouth at bedtime.    . vitamin B-12 (CYANOCOBALAMIN) 500 MCG tablet Take 2 tablets by mouth every Monday, Wednesday, and Friday.    Marland Kitchen albuterol (VENTOLIN HFA) 108 (90 Base) MCG/ACT inhaler Inhale 1 puff into the lungs every 6 (six) hours as needed for shortness of breath.      Allergies as of 08/11/2020 - Review Complete 08/11/2020  Allergen Reaction Noted  . Bee venom Anaphylaxis 02/22/2011  . Influenza vaccines Other (See Comments) 07/13/2013     Review of Systems:    Constitutional: No weight loss, fever or chills Skin: No rash  Cardiovascular: No chest pain Respiratory: No SOB  Gastrointestinal: See HPI and otherwise negative Genitourinary: No dysuria  Neurological: No headache, dizziness or syncope Musculoskeletal: No new muscle or joint pain Hematologic: No bleeding Psychiatric: No  history of depression or anxiety    Physical Exam:  Vital signs in last 24 hours: Temp:  [98 F (36.7 C)] 98 F (36.7 C) (06/19 2336) Pulse Rate:  [70-83] 75 (06/20 0815) Resp:  [10-16] 12 (06/20 0815) BP: (130-191)/(61-90) 191/77 (06/20 0815) SpO2:  [90 %-96 %] 94 % (06/20 0815) Weight:  [93.9 kg] 93.9 kg (06/19 2336)   General:   Pleasant Caucasian male appears to be in NAD, Well developed, Well nourished, alert and cooperative Head:  Normocephalic and atraumatic. Eyes:   PEERL, EOMI. No icterus. Conjunctiva pink. Ears:  Normal auditory acuity. Neck:  Supple Throat: Oral cavity and pharynx without inflammation, swelling or lesion.  Lungs: Respirations even and unlabored. Lungs clear to auscultation bilaterally.   No wheezes, crackles, or rhonchi.  Heart: Normal S1, S2. No MRG. Regular rate and rhythm. No peripheral edema, cyanosis  or pallor.  Abdomen:  Soft, nondistended, mild epigastric/LUQ ttp, No rebound or guarding. Normal bowel sounds. No appreciable masses or hepatomegaly. Rectal:  Not performed.  Msk:  Symmetrical without gross deformities. Peripheral pulses intact.  Extremities:  Without edema, no deformity or joint abnormality.  Neurologic:  Alert and  oriented x4;  grossly normal neurologically.  Skin:   Dry and intact without significant lesions or rashes.+multiple tattoos Psychiatric: Demonstrates good judgement and reason without abnormal affect or behaviors.   LAB RESULTS: Recent Labs    08/12/20 0006  WBC 12.2*  HGB 11.6*  HCT 36.8*  PLT 421*   BMET Recent Labs    08/12/20 0006  NA 138  K 4.8  CL 100  CO2 28  GLUCOSE 132*  BUN 16  CREATININE 1.76*  CALCIUM 9.9   LFT Recent Labs    08/12/20 0006  PROT 7.9  ALBUMIN 3.4*  AST 12*  ALT 12  ALKPHOS 94  BILITOT 0.4   PT/INR Recent Labs    08/12/20 0006  LABPROT 15.3*  INR 1.2    STUDIES: CT Angio Chest Pulmonary Embolism (PE) W or WO Contrast  Result Date: 08/12/2020 CLINICAL DATA:   Nonlocalized acute abdominal pain. EXAM: CT ANGIOGRAPHY CHEST CT ABDOMEN AND PELVIS WITH CONTRAST TECHNIQUE: Multidetector CT imaging of the chest was performed using the standard protocol during bolus administration of intravenous contrast. Multiplanar CT image reconstructions and MIPs were obtained to evaluate the vascular anatomy. Multidetector CT imaging of the abdomen and pelvis was performed using the standard protocol during bolus administration of intravenous contrast. CONTRAST:  38mL OMNIPAQUE IOHEXOL 350 MG/ML SOLN COMPARISON:  CT abdomen pelvis 06/20/2020, CT angio chest 07/08/2016 FINDINGS: CTA CHEST FINDINGS Cardiovascular: Satisfactory opacification of the pulmonary arteries to the segmental level. Unable to evaluate at the subsegmental level due to respiratory motion artifact (3: 209, 5:110). No evidence of pulmonary embolism. The main pulmonary artery is normal in caliber. Normal heart size. No pericardial effusion. The thoracic aorta is normal in caliber. Atherosclerotic plaque including at least 3 vessel coronary artery calcifications. Mediastinum/Nodes: No enlarged mediastinal, hilar, or axillary lymph nodes. Thyroid gland, trachea, and esophagus demonstrate no significant findings. Lungs/Pleura: Biapical pleural/pulmonary scarring. Centrilobular emphysematous changes. No focal consolidation. Stable 4 mm subpleural nodule on the right (4:16). No new pulmonary nodule. No pulmonary mass. No pleural effusion. No pneumothorax. Diffuse bronchial wall thickening. Debris within the trachea and carina as well as the left mainstem bronchi. Musculoskeletal: No chest wall abnormality. No suspicious lytic or blastic osseous lesions. No acute displaced fracture. Multilevel degenerative changes of the spine. Review of the MIP images confirms the above findings. CT ABDOMEN and PELVIS FINDINGS Hepatobiliary: Similar-appearing peripheral enhancing 2.2 cm right hepatic lobe lesion on the pulmonary artery phase that  fills in on the portal venous phase likely representing a hepatic hemangioma (3:296). Similar appearing finding measuring 0.7 cm (3:325). No focal liver abnormality. Status post cholecystectomy. No biliary dilatation. Pancreas: Calcifications throughout the pancreatic parenchyma consistent with chronic pancreatitis. Hazy pancreatic contour with associated peripancreatic fat stranding and trace free fluid along the distal pancreas. No definite pseudocyst formation. Question early organizing fluid collection along the greater curvature of the gastric lumen (7:67, 4:26). Spleen: Normal in size without focal abnormality. Adrenals/Urinary Tract: No adrenal nodule bilaterally. Bilateral kidneys enhance symmetrically. Several similar in size in appearance fluid density lesions within the kidneys likely represent simple renal cysts. Subcentimeter hypodensities are too small to characterize. No hydronephrosis. No hydroureter. The urinary bladder is distended with  urine. Stomach/Bowel: Stomach is within normal limits. No evidence of bowel wall thickening or dilatation. Diffuse colonic diverticulosis. The appendix is not definitely identified. Vascular/Lymphatic: No splenic artery aneurysm formation. No abdominal aorta or iliac aneurysm. Severe atherosclerotic plaque of the aorta and its branches. No abdominal, pelvic, or inguinal lymphadenopathy. Reproductive: Prostate is unremarkable. Other: No intraperitoneal free fluid. No intraperitoneal free gas. No organized fluid collection. Musculoskeletal: No abdominal wall hernia or abnormality. Diffusely decreased bone density. No suspicious lytic or blastic osseous lesions. No acute displaced fracture. Similar-appearing multilevel degenerative changes of the spine with posterolateral fusion and laminectomy of the L2 through L5 levels. Partially visualized total right hip arthroplasty. Review of the MIP images confirms the above findings. IMPRESSION: 1. Acute on chronic  pancreatitis. No definite pseudocyst formation. 2. No central or segmental pulmonary embolus. Unable to evaluate at the subsegmental level due to respiratory motion artifact. 3. Debris noted within the central airways as well as diffuse bronchial wall thickening. No new focal consolidation or finding of infection/inflammation within the lungs. 4. Diffuse colonic diverticulosis with no acute diverticulitis. 5. Two similar-appearing hepatic hemangioma. 6. Aortic Atherosclerosis (ICD10-I70.0) and Emphysema (ICD10-J43.9). 7. Status post cholecystectomy. Electronically Signed   By: Iven Finn M.D.   On: 08/12/2020 03:44   CT ABDOMEN PELVIS W CONTRAST  Result Date: 08/12/2020 CLINICAL DATA:  Nonlocalized acute abdominal pain. EXAM: CT ANGIOGRAPHY CHEST CT ABDOMEN AND PELVIS WITH CONTRAST TECHNIQUE: Multidetector CT imaging of the chest was performed using the standard protocol during bolus administration of intravenous contrast. Multiplanar CT image reconstructions and MIPs were obtained to evaluate the vascular anatomy. Multidetector CT imaging of the abdomen and pelvis was performed using the standard protocol during bolus administration of intravenous contrast. CONTRAST:  17mL OMNIPAQUE IOHEXOL 350 MG/ML SOLN COMPARISON:  CT abdomen pelvis 06/20/2020, CT angio chest 07/08/2016 FINDINGS: CTA CHEST FINDINGS Cardiovascular: Satisfactory opacification of the pulmonary arteries to the segmental level. Unable to evaluate at the subsegmental level due to respiratory motion artifact (3: 209, 5:110). No evidence of pulmonary embolism. The main pulmonary artery is normal in caliber. Normal heart size. No pericardial effusion. The thoracic aorta is normal in caliber. Atherosclerotic plaque including at least 3 vessel coronary artery calcifications. Mediastinum/Nodes: No enlarged mediastinal, hilar, or axillary lymph nodes. Thyroid gland, trachea, and esophagus demonstrate no significant findings. Lungs/Pleura: Biapical  pleural/pulmonary scarring. Centrilobular emphysematous changes. No focal consolidation. Stable 4 mm subpleural nodule on the right (4:16). No new pulmonary nodule. No pulmonary mass. No pleural effusion. No pneumothorax. Diffuse bronchial wall thickening. Debris within the trachea and carina as well as the left mainstem bronchi. Musculoskeletal: No chest wall abnormality. No suspicious lytic or blastic osseous lesions. No acute displaced fracture. Multilevel degenerative changes of the spine. Review of the MIP images confirms the above findings. CT ABDOMEN and PELVIS FINDINGS Hepatobiliary: Similar-appearing peripheral enhancing 2.2 cm right hepatic lobe lesion on the pulmonary artery phase that fills in on the portal venous phase likely representing a hepatic hemangioma (3:296). Similar appearing finding measuring 0.7 cm (3:325). No focal liver abnormality. Status post cholecystectomy. No biliary dilatation. Pancreas: Calcifications throughout the pancreatic parenchyma consistent with chronic pancreatitis. Hazy pancreatic contour with associated peripancreatic fat stranding and trace free fluid along the distal pancreas. No definite pseudocyst formation. Question early organizing fluid collection along the greater curvature of the gastric lumen (7:67, 4:26). Spleen: Normal in size without focal abnormality. Adrenals/Urinary Tract: No adrenal nodule bilaterally. Bilateral kidneys enhance symmetrically. Several similar in size in appearance fluid density lesions  within the kidneys likely represent simple renal cysts. Subcentimeter hypodensities are too small to characterize. No hydronephrosis. No hydroureter. The urinary bladder is distended with urine. Stomach/Bowel: Stomach is within normal limits. No evidence of bowel wall thickening or dilatation. Diffuse colonic diverticulosis. The appendix is not definitely identified. Vascular/Lymphatic: No splenic artery aneurysm formation. No abdominal aorta or iliac  aneurysm. Severe atherosclerotic plaque of the aorta and its branches. No abdominal, pelvic, or inguinal lymphadenopathy. Reproductive: Prostate is unremarkable. Other: No intraperitoneal free fluid. No intraperitoneal free gas. No organized fluid collection. Musculoskeletal: No abdominal wall hernia or abnormality. Diffusely decreased bone density. No suspicious lytic or blastic osseous lesions. No acute displaced fracture. Similar-appearing multilevel degenerative changes of the spine with posterolateral fusion and laminectomy of the L2 through L5 levels. Partially visualized total right hip arthroplasty. Review of the MIP images confirms the above findings. IMPRESSION: 1. Acute on chronic pancreatitis. No definite pseudocyst formation. 2. No central or segmental pulmonary embolus. Unable to evaluate at the subsegmental level due to respiratory motion artifact. 3. Debris noted within the central airways as well as diffuse bronchial wall thickening. No new focal consolidation or finding of infection/inflammation within the lungs. 4. Diffuse colonic diverticulosis with no acute diverticulitis. 5. Two similar-appearing hepatic hemangioma. 6. Aortic Atherosclerosis (ICD10-I70.0) and Emphysema (ICD10-J43.9). 7. Status post cholecystectomy. Electronically Signed   By: Iven Finn M.D.   On: 08/12/2020 03:44     Impression / Plan:   Impression: 1.  Acute on chronic pancreatitis: Recent GI work-up including EUS with signs of chronic pancreatitis and some atypical cells thought to either be inflammation or cancer, at time of admission CT as above with acute on chronic pancreatitis 2.  Epigastric pain: With above  Plan: 1.  Agree with n.p.o. 2.  Analgesics and antiemetics as ordered 3.  Continue lactated Ringer's at 125 mL/hr for now 4.  Please await any further recommendations from Dr. Havery Moros later today  Thank you for your kind consultation, we will continue to follow.  Lavone Nian [facility]   08/12/2020, 8:38 AM

## 2020-08-13 DIAGNOSIS — K861 Other chronic pancreatitis: Secondary | ICD-10-CM

## 2020-08-13 DIAGNOSIS — K859 Acute pancreatitis without necrosis or infection, unspecified: Principal | ICD-10-CM

## 2020-08-13 DIAGNOSIS — N1832 Chronic kidney disease, stage 3b: Secondary | ICD-10-CM

## 2020-08-13 DIAGNOSIS — J449 Chronic obstructive pulmonary disease, unspecified: Secondary | ICD-10-CM

## 2020-08-13 DIAGNOSIS — I2510 Atherosclerotic heart disease of native coronary artery without angina pectoris: Secondary | ICD-10-CM

## 2020-08-13 DIAGNOSIS — R933 Abnormal findings on diagnostic imaging of other parts of digestive tract: Secondary | ICD-10-CM

## 2020-08-13 DIAGNOSIS — I2583 Coronary atherosclerosis due to lipid rich plaque: Secondary | ICD-10-CM

## 2020-08-13 LAB — COMPREHENSIVE METABOLIC PANEL
ALT: 11 U/L (ref 0–44)
AST: 20 U/L (ref 15–41)
Albumin: 2.6 g/dL — ABNORMAL LOW (ref 3.5–5.0)
Alkaline Phosphatase: 93 U/L (ref 38–126)
Anion gap: 8 (ref 5–15)
BUN: 15 mg/dL (ref 8–23)
CO2: 28 mmol/L (ref 22–32)
Calcium: 8.6 mg/dL — ABNORMAL LOW (ref 8.9–10.3)
Chloride: 102 mmol/L (ref 98–111)
Creatinine, Ser: 1.74 mg/dL — ABNORMAL HIGH (ref 0.61–1.24)
GFR, Estimated: 38 mL/min — ABNORMAL LOW (ref >60)
Glucose, Bld: 98 mg/dL (ref 70–99)
Potassium: 4 mmol/L (ref 3.5–5.1)
Sodium: 138 mmol/L (ref 135–145)
Total Bilirubin: 0.5 mg/dL (ref 0.3–1.2)
Total Protein: 6.4 g/dL — ABNORMAL LOW (ref 6.5–8.1)

## 2020-08-13 LAB — GLUCOSE, CAPILLARY
Glucose-Capillary: 83 mg/dL (ref 70–99)
Glucose-Capillary: 104 mg/dL — ABNORMAL HIGH (ref 70–99)
Glucose-Capillary: 108 mg/dL — ABNORMAL HIGH (ref 70–99)
Glucose-Capillary: 143 mg/dL — ABNORMAL HIGH (ref 70–99)

## 2020-08-13 LAB — LIPASE, BLOOD: Lipase: 67 U/L — ABNORMAL HIGH (ref 11–51)

## 2020-08-13 MED ORDER — HYDROMORPHONE HCL 1 MG/ML IJ SOLN
0.5000 mg | INTRAMUSCULAR | Status: DC | PRN
  Administered 2020-08-14 – 2020-08-15 (×5): 1 mg via INTRAVENOUS
  Filled 2020-08-13 (×5): qty 1

## 2020-08-13 NOTE — Progress Notes (Addendum)
Progress Note   Subjective  Chief Complaint: Acute on chronic pancreatitis  Interestingly, again patient's pain is mostly on the left side, even tender over his ribs.  Tells me that he is only having a small amount of pain today as compared to yesterday.  For the most part feels better.  Explained that he is still not really hungry but is okay with trying to order some clear liquids for lunch.  Mostly complains that his IV pole has been going off constantly and his hip is hurting.  He would like to get home as soon as he can.   Objective   Vital signs in last 24 hours: Temp:  [98.2 F (36.8 C)-99.5 F (37.5 C)] 99.5 F (37.5 C) (06/21 0614) Pulse Rate:  [67-86] 86 (06/21 0818) Resp:  [11-18] 16 (06/21 1610) BP: (120-184)/(60-97) 145/60 (06/21 0818) SpO2:  [89 %-94 %] 93 % (06/21 0818) Last BM Date: 08/13/20 General:    white male in NAD Heart:  Regular rate and rhythm; no murmurs Lungs: Respirations even and unlabored, lungs CTA bilaterally Abdomen:  Soft, moderate ttp over LUQ and nondistended. Normal bowel sounds. Extremities:  Without edema. Psych:  Cooperative. Normal mood and affect.  Intake/Output from previous day: 06/20 0701 - 06/21 0700 In: 2559.5 [P.O.:60; I.V.:2499.5] Out: 1 [Urine:1]   Lab Results: Recent Labs    08/12/20 0006  WBC 12.2*  HGB 11.6*  HCT 36.8*  PLT 421*   BMET Recent Labs    08/12/20 0006 08/13/20 0446  NA 138 138  K 4.8 4.0  CL 100 102  CO2 28 28  GLUCOSE 132* 98  BUN 16 15  CREATININE 1.76* 1.74*  CALCIUM 9.9 8.6*   LFT Recent Labs    08/13/20 0446  PROT 6.4*  ALBUMIN 2.6*  AST 20  ALT 11  ALKPHOS 93  BILITOT 0.5   PT/INR Recent Labs    08/12/20 0006  LABPROT 15.3*  INR 1.2    Studies/Results: CT Angio Chest Pulmonary Embolism (PE) W or WO Contrast  Result Date: 08/12/2020 CLINICAL DATA:  Nonlocalized acute abdominal pain. EXAM: CT ANGIOGRAPHY CHEST CT ABDOMEN AND PELVIS WITH CONTRAST TECHNIQUE:  Multidetector CT imaging of the chest was performed using the standard protocol during bolus administration of intravenous contrast. Multiplanar CT image reconstructions and MIPs were obtained to evaluate the vascular anatomy. Multidetector CT imaging of the abdomen and pelvis was performed using the standard protocol during bolus administration of intravenous contrast. CONTRAST:  48mL OMNIPAQUE IOHEXOL 350 MG/ML SOLN COMPARISON:  CT abdomen pelvis 06/20/2020, CT angio chest 07/08/2016 FINDINGS: CTA CHEST FINDINGS Cardiovascular: Satisfactory opacification of the pulmonary arteries to the segmental level. Unable to evaluate at the subsegmental level due to respiratory motion artifact (3: 209, 5:110). No evidence of pulmonary embolism. The main pulmonary artery is normal in caliber. Normal heart size. No pericardial effusion. The thoracic aorta is normal in caliber. Atherosclerotic plaque including at least 3 vessel coronary artery calcifications. Mediastinum/Nodes: No enlarged mediastinal, hilar, or axillary lymph nodes. Thyroid gland, trachea, and esophagus demonstrate no significant findings. Lungs/Pleura: Biapical pleural/pulmonary scarring. Centrilobular emphysematous changes. No focal consolidation. Stable 4 mm subpleural nodule on the right (4:16). No new pulmonary nodule. No pulmonary mass. No pleural effusion. No pneumothorax. Diffuse bronchial wall thickening. Debris within the trachea and carina as well as the left mainstem bronchi. Musculoskeletal: No chest wall abnormality. No suspicious lytic or blastic osseous lesions. No acute displaced fracture. Multilevel degenerative changes of the spine. Review of the  MIP images confirms the above findings. CT ABDOMEN and PELVIS FINDINGS Hepatobiliary: Similar-appearing peripheral enhancing 2.2 cm right hepatic lobe lesion on the pulmonary artery phase that fills in on the portal venous phase likely representing a hepatic hemangioma (3:296). Similar appearing  finding measuring 0.7 cm (3:325). No focal liver abnormality. Status post cholecystectomy. No biliary dilatation. Pancreas: Calcifications throughout the pancreatic parenchyma consistent with chronic pancreatitis. Hazy pancreatic contour with associated peripancreatic fat stranding and trace free fluid along the distal pancreas. No definite pseudocyst formation. Question early organizing fluid collection along the greater curvature of the gastric lumen (7:67, 4:26). Spleen: Normal in size without focal abnormality. Adrenals/Urinary Tract: No adrenal nodule bilaterally. Bilateral kidneys enhance symmetrically. Several similar in size in appearance fluid density lesions within the kidneys likely represent simple renal cysts. Subcentimeter hypodensities are too small to characterize. No hydronephrosis. No hydroureter. The urinary bladder is distended with urine. Stomach/Bowel: Stomach is within normal limits. No evidence of bowel wall thickening or dilatation. Diffuse colonic diverticulosis. The appendix is not definitely identified. Vascular/Lymphatic: No splenic artery aneurysm formation. No abdominal aorta or iliac aneurysm. Severe atherosclerotic plaque of the aorta and its branches. No abdominal, pelvic, or inguinal lymphadenopathy. Reproductive: Prostate is unremarkable. Other: No intraperitoneal free fluid. No intraperitoneal free gas. No organized fluid collection. Musculoskeletal: No abdominal wall hernia or abnormality. Diffusely decreased bone density. No suspicious lytic or blastic osseous lesions. No acute displaced fracture. Similar-appearing multilevel degenerative changes of the spine with posterolateral fusion and laminectomy of the L2 through L5 levels. Partially visualized total right hip arthroplasty. Review of the MIP images confirms the above findings. IMPRESSION: 1. Acute on chronic pancreatitis. No definite pseudocyst formation. 2. No central or segmental pulmonary embolus. Unable to evaluate at  the subsegmental level due to respiratory motion artifact. 3. Debris noted within the central airways as well as diffuse bronchial wall thickening. No new focal consolidation or finding of infection/inflammation within the lungs. 4. Diffuse colonic diverticulosis with no acute diverticulitis. 5. Two similar-appearing hepatic hemangioma. 6. Aortic Atherosclerosis (ICD10-I70.0) and Emphysema (ICD10-J43.9). 7. Status post cholecystectomy. Electronically Signed   By: Iven Finn M.D.   On: 08/12/2020 03:44   CT ABDOMEN PELVIS W CONTRAST  Result Date: 08/12/2020 CLINICAL DATA:  Nonlocalized acute abdominal pain. EXAM: CT ANGIOGRAPHY CHEST CT ABDOMEN AND PELVIS WITH CONTRAST TECHNIQUE: Multidetector CT imaging of the chest was performed using the standard protocol during bolus administration of intravenous contrast. Multiplanar CT image reconstructions and MIPs were obtained to evaluate the vascular anatomy. Multidetector CT imaging of the abdomen and pelvis was performed using the standard protocol during bolus administration of intravenous contrast. CONTRAST:  50mL OMNIPAQUE IOHEXOL 350 MG/ML SOLN COMPARISON:  CT abdomen pelvis 06/20/2020, CT angio chest 07/08/2016 FINDINGS: CTA CHEST FINDINGS Cardiovascular: Satisfactory opacification of the pulmonary arteries to the segmental level. Unable to evaluate at the subsegmental level due to respiratory motion artifact (3: 209, 5:110). No evidence of pulmonary embolism. The main pulmonary artery is normal in caliber. Normal heart size. No pericardial effusion. The thoracic aorta is normal in caliber. Atherosclerotic plaque including at least 3 vessel coronary artery calcifications. Mediastinum/Nodes: No enlarged mediastinal, hilar, or axillary lymph nodes. Thyroid gland, trachea, and esophagus demonstrate no significant findings. Lungs/Pleura: Biapical pleural/pulmonary scarring. Centrilobular emphysematous changes. No focal consolidation. Stable 4 mm subpleural  nodule on the right (4:16). No new pulmonary nodule. No pulmonary mass. No pleural effusion. No pneumothorax. Diffuse bronchial wall thickening. Debris within the trachea and carina as well as the left mainstem  bronchi. Musculoskeletal: No chest wall abnormality. No suspicious lytic or blastic osseous lesions. No acute displaced fracture. Multilevel degenerative changes of the spine. Review of the MIP images confirms the above findings. CT ABDOMEN and PELVIS FINDINGS Hepatobiliary: Similar-appearing peripheral enhancing 2.2 cm right hepatic lobe lesion on the pulmonary artery phase that fills in on the portal venous phase likely representing a hepatic hemangioma (3:296). Similar appearing finding measuring 0.7 cm (3:325). No focal liver abnormality. Status post cholecystectomy. No biliary dilatation. Pancreas: Calcifications throughout the pancreatic parenchyma consistent with chronic pancreatitis. Hazy pancreatic contour with associated peripancreatic fat stranding and trace free fluid along the distal pancreas. No definite pseudocyst formation. Question early organizing fluid collection along the greater curvature of the gastric lumen (7:67, 4:26). Spleen: Normal in size without focal abnormality. Adrenals/Urinary Tract: No adrenal nodule bilaterally. Bilateral kidneys enhance symmetrically. Several similar in size in appearance fluid density lesions within the kidneys likely represent simple renal cysts. Subcentimeter hypodensities are too small to characterize. No hydronephrosis. No hydroureter. The urinary bladder is distended with urine. Stomach/Bowel: Stomach is within normal limits. No evidence of bowel wall thickening or dilatation. Diffuse colonic diverticulosis. The appendix is not definitely identified. Vascular/Lymphatic: No splenic artery aneurysm formation. No abdominal aorta or iliac aneurysm. Severe atherosclerotic plaque of the aorta and its branches. No abdominal, pelvic, or inguinal  lymphadenopathy. Reproductive: Prostate is unremarkable. Other: No intraperitoneal free fluid. No intraperitoneal free gas. No organized fluid collection. Musculoskeletal: No abdominal wall hernia or abnormality. Diffusely decreased bone density. No suspicious lytic or blastic osseous lesions. No acute displaced fracture. Similar-appearing multilevel degenerative changes of the spine with posterolateral fusion and laminectomy of the L2 through L5 levels. Partially visualized total right hip arthroplasty. Review of the MIP images confirms the above findings. IMPRESSION: 1. Acute on chronic pancreatitis. No definite pseudocyst formation. 2. No central or segmental pulmonary embolus. Unable to evaluate at the subsegmental level due to respiratory motion artifact. 3. Debris noted within the central airways as well as diffuse bronchial wall thickening. No new focal consolidation or finding of infection/inflammation within the lungs. 4. Diffuse colonic diverticulosis with no acute diverticulitis. 5. Two similar-appearing hepatic hemangioma. 6. Aortic Atherosclerosis (ICD10-I70.0) and Emphysema (ICD10-J43.9). 7. Status post cholecystectomy. Electronically Signed   By: Iven Finn M.D.   On: 08/12/2020 03:44       Assessment / Plan:   Assessment: 1.  Acute on chronic pancreatitis 2.  Epigastric pain  Plan: 1.  Advanced patient's diet to clear liquids for lunch, we will see how he does with this. 2.  Continue analgesics and antiemetics 3.  Continue fluids 4.  Again patient will warrant a follow-up EUS at some point given this occurrence 5.  Please await any further recommendations from Dr. Havery Moros later today  Thank for kind consultation, we will continue to follow.    LOS: 1 day   Levin Erp  08/13/2020, 10:20 AM

## 2020-08-13 NOTE — Progress Notes (Signed)
PT Cancellation Note  Patient Details Name: Wayne Meadows MRN: [id number] DOB: [birth year]   Cancelled Treatment:    Reason Eval/Treat Not Completed: Patient declined, no reason specified Pt declined mobility today.  Pt states, "maybe tomorrow."     Trena Platt 08/13/2020, 2:38 PM Jannette Spanner PT, DPT Acute Rehabilitation Services Pager: [phone] [phone]

## 2020-08-13 NOTE — Progress Notes (Signed)
CVPatient ID: Wayne Meadows, male   DOB: Jul 15, 1934, 86 y.o.   MRN: [id number]  PROGRESS NOTE    Wayne Meadows  IRW:[id number] DOB: 10/18/1934 DOA: 08/11/2020 PCP: System, Provider Not In   Brief Narrative:  86 year old male with history of COPD, CAD status post cardiac cath and stenting on 07/09/2020, hypertension, CKD stage III, unspecified CVA, renal carcinoma, status post partial colectomy in 2013 following colon perforation from a colonoscopy, recent pancreatitis requiring outpatient EUS on 07/18/2020 showing changes of chronic pancreatitis of the tail and some associated intramural gastric fluid collections presented again on 08/12/2020 with worsening abdominal pain and imaging showed findings of acute on chronic pancreatitis with no definite pseudocyst formation.  GI was consulted.  He was started on IV fluids.  Assessment & Plan:   Acute on chronic pancreatitis -Patient had recent pancreatitis requiring outpatient EUS on 07/18/2020 showing changes of chronic pancreatitis of the tail and some associated intramural gastric fluid collections -Presented again with worsening abdominal pain and imaging showed findings of acute on chronic pancreatitis with no definite pseudocyst formation. -GI following.  Continue IV fluids, pain management, antiemetics as needed.  Diet advancement as per GI.  COPD -Stable.  Continue current nebs.  Hypertension -Continue amlodipine, clonidine, enalapril and metoprolol  Hyperlipidemia - continue statin  CAD -Patient had a recent cardiac cath and stenting on 07/09/2020. -Continue aspirin, beta-blocker, statin.  Outpatient follow-up with cardiology.  Hypothyroidism -Continue levothyroxine  CKD stage IIIb next-creatinine stable  Leukocytosis -Possibly reactive.  Repeat a.m. labs  Diabetes mellitus type 2 -A1c 7.2 last month.  Continue CBGs with SSI    DVT prophylaxis: Lovenox Code Status: Full Family Communication: None at  bedside Disposition Plan: Status is: Inpatient  Remains inpatient appropriate because:Inpatient level of care appropriate due to severity of illness  Dispo: The patient is from: Home              Anticipated d/c is to: Home              Patient currently is not medically stable to d/c.   Difficult to place patient No  Consultants: GI  Procedures: None  Antimicrobials: None   Subjective: Patient seen and examined at bedside.  Complains of some left-sided pain.  Does not feel hungry.  Denies current nausea or vomiting.  No overnight fever, worsening shortness of breath reported.  Objective: Vitals:   08/12/20 2130 08/13/20 0614 08/13/20 0802 08/13/20 0818  BP: (!) 149/72 136/63  (!) 145/60  Pulse: 86 81  86  Resp: 14 16    Temp: 99.3 F (37.4 C) 99.5 F (37.5 C)    TempSrc: Oral Oral    SpO2: 93% 91% 93% 93%  Weight:      Height:        Intake/Output Summary (Last 24 hours) at 08/13/2020 1105 Last data filed at 08/13/2020 0656 Gross per 24 hour  Intake 2559.52 ml  Output 1 ml  Net 2558.52 ml   Filed Weights   08/11/20 2336  Weight: 93.9 kg    Examination:  General exam: Appears calm and comfortable.  Currently on room air. Respiratory system: Bilateral decreased breath sounds at bases Cardiovascular system: S1 & S2 heard, Rate controlled Gastrointestinal system: Abdomen is nondistended, soft mildly tender in the left upper quadrant.  Normal bowel sounds heard. Extremities: No cyanosis, clubbing, edema  Central nervous system: Alert and oriented. No focal neurological deficits. Moving extremities Skin: No rashes, lesions or ulcers Psychiatry: Judgement and insight appear normal.  Mood & affect appropriate.     Data Reviewed: I have personally reviewed following labs and imaging studies  CBC: Recent Labs  Lab 08/12/20 0006  WBC 12.2*  HGB 11.6*  HCT 36.8*  MCV 92.9  PLT 098*   Basic Metabolic Panel: Recent Labs  Lab 08/12/20 0006 08/13/20 0446   NA 138 138  K 4.8 4.0  CL 100 102  CO2 28 28  GLUCOSE 132* 98  BUN 16 15  CREATININE 1.76* 1.74*  CALCIUM 9.9 8.6*   GFR: Estimated Creatinine Clearance: 37.4 mL/min (A) (by C-G formula based on SCr of 1.74 mg/dL (H)). Liver Function Tests: Recent Labs  Lab 08/12/20 0006 08/13/20 0446  AST 12* 20  ALT 12 11  ALKPHOS 94 93  BILITOT 0.4 0.5  PROT 7.9 6.4*  ALBUMIN 3.4* 2.6*   Recent Labs  Lab 08/12/20 0006 08/13/20 0446  LIPASE 89* 67*   No results for input(s): AMMONIA in the last 168 hours. Coagulation Profile: Recent Labs  Lab 08/12/20 0006  INR 1.2   Cardiac Enzymes: No results for input(s): CKTOTAL, CKMB, CKMBINDEX, TROPONINI in the last 168 hours. BNP (last 3 results) No results for input(s): PROBNP in the last 8760 hours. HbA1C: No results for input(s): HGBA1C in the last 72 hours. CBG: Recent Labs  Lab 08/12/20 1209 08/12/20 1734 08/12/20 2128 08/13/20 0711  GLUCAP 106* 99 91 83   Lipid Profile: Recent Labs    08/12/20 0400  CHOL 65  HDL 27*  LDLCALC 23  TRIG 74  CHOLHDL 2.4   Thyroid Function Tests: No results for input(s): TSH, T4TOTAL, FREET4, T3FREE, THYROIDAB in the last 72 hours. Anemia Panel: No results for input(s): VITAMINB12, FOLATE, FERRITIN, TIBC, IRON, RETICCTPCT in the last 72 hours. Sepsis Labs: No results for input(s): PROCALCITON, LATICACIDVEN in the last 168 hours.  Recent Results (from the past 240 hour(s))  Resp Panel by RT-PCR (Flu A&B, Covid) Nasopharyngeal Swab     Status: None   Collection Time: 08/12/20  4:08 AM   Specimen: Nasopharyngeal Swab; Nasopharyngeal(NP) swabs in vial transport medium  Result Value Ref Range Status   SARS Coronavirus 2 by RT PCR NEGATIVE NEGATIVE Final    Comment: (NOTE) SARS-CoV-2 target nucleic acids are NOT DETECTED.  The SARS-CoV-2 RNA is generally detectable in upper respiratory specimens during the acute phase of infection. The lowest concentration of SARS-CoV-2 viral copies  this assay can detect is 138 copies/mL. A negative result does not preclude SARS-Cov-2 infection and should not be used as the sole basis for treatment or other patient management decisions. A negative result may occur with  improper specimen collection/handling, submission of specimen other than nasopharyngeal swab, presence of viral mutation(s) within the areas targeted by this assay, and inadequate number of viral copies(<138 copies/mL). A negative result must be combined with clinical observations, patient history, and epidemiological information. The expected result is Negative.  Fact Sheet for Patients:  EntrepreneurPulse.com.au  Fact Sheet for Healthcare Providers:  IncredibleEmployment.be  This test is no t yet approved or cleared by the [place] FDA and  has been authorized for detection and/or diagnosis of SARS-CoV-2 by FDA under an Emergency Use Authorization (EUA). This EUA will remain  in effect (meaning this test can be used) for the duration of the COVID-19 declaration under Section 564(b)(1) of the Act, 21 U.S.C.section 360bbb-3(b)(1), unless the authorization is terminated  or revoked sooner.       Influenza A by PCR NEGATIVE NEGATIVE Final   Influenza  B by PCR NEGATIVE NEGATIVE Final    Comment: (NOTE) The Xpert Xpress SARS-CoV-2/FLU/RSV plus assay is intended as an aid in the diagnosis of influenza from Nasopharyngeal swab specimens and should not be used as a sole basis for treatment. Nasal washings and aspirates are unacceptable for Xpert Xpress SARS-CoV-2/FLU/RSV testing.  Fact Sheet for Patients: EntrepreneurPulse.com.au  Fact Sheet for Healthcare Providers: IncredibleEmployment.be  This test is not yet approved or cleared by the [place] FDA and has been authorized for detection and/or diagnosis of SARS-CoV-2 by FDA under an Emergency Use Authorization (EUA). This EUA  will remain in effect (meaning this test can be used) for the duration of the COVID-19 declaration under Section 564(b)(1) of the Act, 21 U.S.C. section 360bbb-3(b)(1), unless the authorization is terminated or revoked.  Performed at [facility], [place] [address] [place], [place] [address]          Radiology Studies: CT Angio Chest Pulmonary Embolism (PE) W or WO Contrast  Result Date: 08/12/2020 CLINICAL DATA:  Nonlocalized acute abdominal pain. EXAM: CT ANGIOGRAPHY CHEST CT ABDOMEN AND PELVIS WITH CONTRAST TECHNIQUE: Multidetector CT imaging of the chest was performed using the standard protocol during bolus administration of intravenous contrast. Multiplanar CT image reconstructions and MIPs were obtained to evaluate the vascular anatomy. Multidetector CT imaging of the abdomen and pelvis was performed using the standard protocol during bolus administration of intravenous contrast. CONTRAST:  41mL OMNIPAQUE IOHEXOL 350 MG/ML SOLN COMPARISON:  CT abdomen pelvis 06/20/2020, CT angio chest 07/08/2016 FINDINGS: CTA CHEST FINDINGS Cardiovascular: Satisfactory opacification of the pulmonary arteries to the segmental level. Unable to evaluate at the subsegmental level due to respiratory motion artifact (3: 209, 5:110). No evidence of pulmonary embolism. The main pulmonary artery is normal in caliber. Normal heart size. No pericardial effusion. The thoracic aorta is normal in caliber. Atherosclerotic plaque including at least 3 vessel coronary artery calcifications. Mediastinum/Nodes: No enlarged mediastinal, hilar, or axillary lymph nodes. Thyroid gland, trachea, and esophagus demonstrate no significant findings. Lungs/Pleura: Biapical pleural/pulmonary scarring. Centrilobular emphysematous changes. No focal consolidation. Stable 4 mm subpleural nodule on the right (4:16). No new pulmonary nodule. No pulmonary mass. No pleural effusion. No pneumothorax. Diffuse bronchial wall  thickening. Debris within the trachea and carina as well as the left mainstem bronchi. Musculoskeletal: No chest wall abnormality. No suspicious lytic or blastic osseous lesions. No acute displaced fracture. Multilevel degenerative changes of the spine. Review of the MIP images confirms the above findings. CT ABDOMEN and PELVIS FINDINGS Hepatobiliary: Similar-appearing peripheral enhancing 2.2 cm right hepatic lobe lesion on the pulmonary artery phase that fills in on the portal venous phase likely representing a hepatic hemangioma (3:296). Similar appearing finding measuring 0.7 cm (3:325). No focal liver abnormality. Status post cholecystectomy. No biliary dilatation. Pancreas: Calcifications throughout the pancreatic parenchyma consistent with chronic pancreatitis. Hazy pancreatic contour with associated peripancreatic fat stranding and trace free fluid along the distal pancreas. No definite pseudocyst formation. Question early organizing fluid collection along the greater curvature of the gastric lumen (7:67, 4:26). Spleen: Normal in size without focal abnormality. Adrenals/Urinary Tract: No adrenal nodule bilaterally. Bilateral kidneys enhance symmetrically. Several similar in size in appearance fluid density lesions within the kidneys likely represent simple renal cysts. Subcentimeter hypodensities are too small to characterize. No hydronephrosis. No hydroureter. The urinary bladder is distended with urine. Stomach/Bowel: Stomach is within normal limits. No evidence of bowel wall thickening or dilatation. Diffuse colonic diverticulosis. The appendix is not definitely identified. Vascular/Lymphatic: No splenic artery  aneurysm formation. No abdominal aorta or iliac aneurysm. Severe atherosclerotic plaque of the aorta and its branches. No abdominal, pelvic, or inguinal lymphadenopathy. Reproductive: Prostate is unremarkable. Other: No intraperitoneal free fluid. No intraperitoneal free gas. No organized fluid  collection. Musculoskeletal: No abdominal wall hernia or abnormality. Diffusely decreased bone density. No suspicious lytic or blastic osseous lesions. No acute displaced fracture. Similar-appearing multilevel degenerative changes of the spine with posterolateral fusion and laminectomy of the L2 through L5 levels. Partially visualized total right hip arthroplasty. Review of the MIP images confirms the above findings. IMPRESSION: 1. Acute on chronic pancreatitis. No definite pseudocyst formation. 2. No central or segmental pulmonary embolus. Unable to evaluate at the subsegmental level due to respiratory motion artifact. 3. Debris noted within the central airways as well as diffuse bronchial wall thickening. No new focal consolidation or finding of infection/inflammation within the lungs. 4. Diffuse colonic diverticulosis with no acute diverticulitis. 5. Two similar-appearing hepatic hemangioma. 6. Aortic Atherosclerosis (ICD10-I70.0) and Emphysema (ICD10-J43.9). 7. Status post cholecystectomy. Electronically Signed   By: Iven Finn M.D.   On: 08/12/2020 03:44   CT ABDOMEN PELVIS W CONTRAST  Result Date: 08/12/2020 CLINICAL DATA:  Nonlocalized acute abdominal pain. EXAM: CT ANGIOGRAPHY CHEST CT ABDOMEN AND PELVIS WITH CONTRAST TECHNIQUE: Multidetector CT imaging of the chest was performed using the standard protocol during bolus administration of intravenous contrast. Multiplanar CT image reconstructions and MIPs were obtained to evaluate the vascular anatomy. Multidetector CT imaging of the abdomen and pelvis was performed using the standard protocol during bolus administration of intravenous contrast. CONTRAST:  80mL OMNIPAQUE IOHEXOL 350 MG/ML SOLN COMPARISON:  CT abdomen pelvis 06/20/2020, CT angio chest 07/08/2016 FINDINGS: CTA CHEST FINDINGS Cardiovascular: Satisfactory opacification of the pulmonary arteries to the segmental level. Unable to evaluate at the subsegmental level due to respiratory motion  artifact (3: 209, 5:110). No evidence of pulmonary embolism. The main pulmonary artery is normal in caliber. Normal heart size. No pericardial effusion. The thoracic aorta is normal in caliber. Atherosclerotic plaque including at least 3 vessel coronary artery calcifications. Mediastinum/Nodes: No enlarged mediastinal, hilar, or axillary lymph nodes. Thyroid gland, trachea, and esophagus demonstrate no significant findings. Lungs/Pleura: Biapical pleural/pulmonary scarring. Centrilobular emphysematous changes. No focal consolidation. Stable 4 mm subpleural nodule on the right (4:16). No new pulmonary nodule. No pulmonary mass. No pleural effusion. No pneumothorax. Diffuse bronchial wall thickening. Debris within the trachea and carina as well as the left mainstem bronchi. Musculoskeletal: No chest wall abnormality. No suspicious lytic or blastic osseous lesions. No acute displaced fracture. Multilevel degenerative changes of the spine. Review of the MIP images confirms the above findings. CT ABDOMEN and PELVIS FINDINGS Hepatobiliary: Similar-appearing peripheral enhancing 2.2 cm right hepatic lobe lesion on the pulmonary artery phase that fills in on the portal venous phase likely representing a hepatic hemangioma (3:296). Similar appearing finding measuring 0.7 cm (3:325). No focal liver abnormality. Status post cholecystectomy. No biliary dilatation. Pancreas: Calcifications throughout the pancreatic parenchyma consistent with chronic pancreatitis. Hazy pancreatic contour with associated peripancreatic fat stranding and trace free fluid along the distal pancreas. No definite pseudocyst formation. Question early organizing fluid collection along the greater curvature of the gastric lumen (7:67, 4:26). Spleen: Normal in size without focal abnormality. Adrenals/Urinary Tract: No adrenal nodule bilaterally. Bilateral kidneys enhance symmetrically. Several similar in size in appearance fluid density lesions within the  kidneys likely represent simple renal cysts. Subcentimeter hypodensities are too small to characterize. No hydronephrosis. No hydroureter. The urinary bladder is distended with urine. Stomach/Bowel:  Stomach is within normal limits. No evidence of bowel wall thickening or dilatation. Diffuse colonic diverticulosis. The appendix is not definitely identified. Vascular/Lymphatic: No splenic artery aneurysm formation. No abdominal aorta or iliac aneurysm. Severe atherosclerotic plaque of the aorta and its branches. No abdominal, pelvic, or inguinal lymphadenopathy. Reproductive: Prostate is unremarkable. Other: No intraperitoneal free fluid. No intraperitoneal free gas. No organized fluid collection. Musculoskeletal: No abdominal wall hernia or abnormality. Diffusely decreased bone density. No suspicious lytic or blastic osseous lesions. No acute displaced fracture. Similar-appearing multilevel degenerative changes of the spine with posterolateral fusion and laminectomy of the L2 through L5 levels. Partially visualized total right hip arthroplasty. Review of the MIP images confirms the above findings. IMPRESSION: 1. Acute on chronic pancreatitis. No definite pseudocyst formation. 2. No central or segmental pulmonary embolus. Unable to evaluate at the subsegmental level due to respiratory motion artifact. 3. Debris noted within the central airways as well as diffuse bronchial wall thickening. No new focal consolidation or finding of infection/inflammation within the lungs. 4. Diffuse colonic diverticulosis with no acute diverticulitis. 5. Two similar-appearing hepatic hemangioma. 6. Aortic Atherosclerosis (ICD10-I70.0) and Emphysema (ICD10-J43.9). 7. Status post cholecystectomy. Electronically Signed   By: Iven Finn M.D.   On: 08/12/2020 03:44        Scheduled Meds:  amLODipine  10 mg Oral Daily   aspirin EC  81 mg Oral Daily   budesonide  0.25 mg Nebulization QHS   cloNIDine  0.1 mg Oral Daily    clopidogrel  75 mg Oral Daily   enalapril  20 mg Oral Daily   And   enalapril  10 mg Oral QHS   enoxaparin (LOVENOX) injection  40 mg Subcutaneous Q24H   gabapentin  300 mg Oral BID WC   And   gabapentin  600 mg Oral QHS   insulin aspart  0-15 Units Subcutaneous TID WC   ipratropium-albuterol  3 mL Nebulization QID   levothyroxine  50 mcg Oral QAC breakfast   metoprolol tartrate  25 mg Oral Daily   pravastatin  40 mg Oral QHS   Continuous Infusions:  lactated ringers 125 mL/hr at 08/13/20 1002          Sally-Ann Cutbirth, MD [facility] 08/13/2020, 11:05 AM

## 2020-08-14 DIAGNOSIS — K859 Acute pancreatitis without necrosis or infection, unspecified: Secondary | ICD-10-CM | POA: Diagnosis not present

## 2020-08-14 DIAGNOSIS — R933 Abnormal findings on diagnostic imaging of other parts of digestive tract: Secondary | ICD-10-CM | POA: Diagnosis not present

## 2020-08-14 LAB — COMPREHENSIVE METABOLIC PANEL
ALT: 11 U/L (ref 0–44)
AST: 20 U/L (ref 15–41)
Albumin: 2.6 g/dL — ABNORMAL LOW (ref 3.5–5.0)
Alkaline Phosphatase: 87 U/L (ref 38–126)
Anion gap: 8 (ref 5–15)
BUN: 13 mg/dL (ref 8–23)
CO2: 25 mmol/L (ref 22–32)
Calcium: 8.5 mg/dL — ABNORMAL LOW (ref 8.9–10.3)
Chloride: 105 mmol/L (ref 98–111)
Creatinine, Ser: 1.58 mg/dL — ABNORMAL HIGH (ref 0.61–1.24)
GFR, Estimated: 43 mL/min — ABNORMAL LOW (ref >60)
Glucose, Bld: 119 mg/dL — ABNORMAL HIGH (ref 70–99)
Potassium: 3.7 mmol/L (ref 3.5–5.1)
Sodium: 138 mmol/L (ref 135–145)
Total Bilirubin: 0.6 mg/dL (ref 0.3–1.2)
Total Protein: 6.4 g/dL — ABNORMAL LOW (ref 6.5–8.1)

## 2020-08-14 LAB — RETICULOCYTES
Immature Retic Fract: 17.7 % — ABNORMAL HIGH (ref 2.3–15.9)
RBC.: 3.37 MIL/uL — ABNORMAL LOW (ref 4.22–5.81)
Retic Count, Absolute: 60 10*3/uL (ref 19.0–186.0)
Retic Ct Pct: 1.8 % (ref 0.4–3.1)

## 2020-08-14 LAB — CBC WITH DIFFERENTIAL/PLATELET
Abs Immature Granulocytes: 0.02 10*3/uL (ref 0.00–0.07)
Basophils Absolute: 0.1 10*3/uL (ref 0.0–0.1)
Basophils Relative: 1 %
Eosinophils Absolute: 0.3 10*3/uL (ref 0.0–0.5)
Eosinophils Relative: 4 %
HCT: 29.9 % — ABNORMAL LOW (ref 39.0–52.0)
Hemoglobin: 9.5 g/dL — ABNORMAL LOW (ref 13.0–17.0)
Immature Granulocytes: 0 %
Lymphocytes Relative: 19 %
Lymphs Abs: 1.4 10*3/uL (ref 0.7–4.0)
MCH: 29 pg (ref 26.0–34.0)
MCHC: 31.8 g/dL (ref 30.0–36.0)
MCV: 91.2 fL (ref 80.0–100.0)
Monocytes Absolute: 0.7 10*3/uL (ref 0.1–1.0)
Monocytes Relative: 9 %
Neutro Abs: 5 10*3/uL (ref 1.7–7.7)
Neutrophils Relative %: 67 %
Platelets: 294 10*3/uL (ref 150–400)
RBC: 3.28 MIL/uL — ABNORMAL LOW (ref 4.22–5.81)
RDW: 14.6 % (ref 11.5–15.5)
WBC: 7.5 10*3/uL (ref 4.0–10.5)
nRBC: 0 % (ref 0.0–0.2)

## 2020-08-14 LAB — FOLATE: Folate: 14.7 ng/mL (ref >5.9)

## 2020-08-14 LAB — GLUCOSE, CAPILLARY
Glucose-Capillary: 108 mg/dL — ABNORMAL HIGH (ref 70–99)
Glucose-Capillary: 133 mg/dL — ABNORMAL HIGH (ref 70–99)
Glucose-Capillary: 103 mg/dL — ABNORMAL HIGH (ref 70–99)
Glucose-Capillary: 128 mg/dL — ABNORMAL HIGH (ref 70–99)

## 2020-08-14 LAB — MAGNESIUM: Magnesium: 1.8 mg/dL (ref 1.7–2.4)

## 2020-08-14 LAB — VITAMIN B12: Vitamin B-12: 240 pg/mL (ref 180–914)

## 2020-08-14 LAB — FERRITIN: Ferritin: 161 ng/mL (ref 24–336)

## 2020-08-14 LAB — IRON AND TIBC
Iron: 35 ug/dL — ABNORMAL LOW (ref 45–182)
Saturation Ratios: 17 % — ABNORMAL LOW (ref 17.9–39.5)
TIBC: 207 ug/dL — ABNORMAL LOW (ref 250–450)
UIBC: 172 ug/dL

## 2020-08-14 NOTE — Evaluation (Signed)
Physical Therapy Evaluation Patient Details Name: Wayne Meadows MRN: [id number] DOB: [birth year] Today's Date: 08/14/2020   History of Present Illness  86 year old male admitted to the hospital 08/11/2020 secondary to N/V with acute on chronic pancreatitis.  PMHx significant for COPD, CAD, CVA, arthritis  Clinical Impression  Pt admitted with above diagnosis. Pt currently with functional limitations due to the deficits listed below (see PT Problem List). Pt will benefit from skilled PT to increase their independence and safety with mobility to allow discharge to the venue listed below.   Pt ambulated in hallway and SPO2 88-93% on room air.  Pt reports mild dyspnea and increased work of breathing with activity however.  Pt pleasant and thankful for mobilizing and left in recliner end of session.  Pt would benefit from ambulating with nursing during acute stay.      Follow Up Recommendations No PT follow up    Equipment Recommendations  None recommended by PT    Recommendations for Other Services       Precautions / Restrictions Precautions Precautions: None      Mobility  Bed Mobility Overal bed mobility: Needs Assistance Bed Mobility: Supine to Sit     Supine to sit: Supervision;HOB elevated          Transfers Overall transfer level: Needs assistance Equipment used: None Transfers: Sit to/from Stand Sit to Stand: Min guard            Ambulation/Gait Ambulation/Gait assistance: Min Gaffer (Feet): 160 Feet Assistive device: IV Pole Gait Pattern/deviations: Step-through pattern;Decreased stride length;Trunk flexed     General Gait Details: cues for posture however pt reports chronic back issues, distance to tolerance, held IV pole for a little more support, SPO2 at lowest 88% however mostly 93% on room air  Stairs            Wheelchair Mobility    Modified Rankin (Stroke Patients Only)       Balance Overall balance  assessment: No apparent balance deficits (not formally assessed)                                           Pertinent Vitals/Pain Pain Assessment: Faces Faces Pain Scale: Hurts little more Pain Location: right hip and low back Pain Descriptors / Indicators: Sore;Aching Pain Intervention(s): Repositioned;Monitored during session;Premedicated before session    Home Living Family/patient expects to be discharged to:: Private residence Living Arrangements: Alone Available Help at Discharge: Family;Available PRN/intermittently Type of Home: House Home Access: Stairs to enter Entrance Stairs-Rails: Right Entrance Stairs-Number of Steps: 2 Home Layout: One level Home Equipment: Walker - 2 wheels;Cane - single point      Prior Function Level of Independence: Independent               Hand Dominance        Extremity/Trunk Assessment        Lower Extremity Assessment Lower Extremity Assessment: Generalized weakness    Cervical / Trunk Assessment Cervical / Trunk Assessment: Other exceptions Cervical / Trunk Exceptions: prefers more trunk flexion due to hx of back sx  Communication   Communication: No difficulties  Cognition Arousal/Alertness: Awake/alert Behavior During Therapy: WFL for tasks assessed/performed Overall Cognitive Status: Within Functional Limits for tasks assessed  General Comments      Exercises     Assessment/Plan    PT Assessment Patient needs continued PT services  PT Problem List Decreased activity tolerance;Decreased mobility;Decreased knowledge of use of DME;Cardiopulmonary status limiting activity       PT Treatment Interventions Gait training;DME instruction;Therapeutic exercise;Functional mobility training;Therapeutic activities;Patient/family education    PT Goals (Current goals can be found in the Care Plan section)  Acute Rehab PT Goals PT Goal  Formulation: With patient Time For Goal Achievement: 08/29/20 Potential to Achieve Goals: Good    Frequency Min 3X/week   Barriers to discharge        Co-evaluation               AM-PAC PT "6 Clicks" Mobility  Outcome Measure Help needed turning from your back to your side while in a flat bed without using bedrails?: A Little Help needed moving from lying on your back to sitting on the side of a flat bed without using bedrails?: A Little Help needed moving to and from a bed to a chair (including a wheelchair)?: A Little Help needed standing up from a chair using your arms (e.g., wheelchair or bedside chair)?: A Little Help needed to walk in hospital room?: A Little Help needed climbing 3-5 steps with a railing? : A Little 6 Click Score: 18    End of Session   Activity Tolerance: Patient tolerated treatment well Patient left: in chair;with call bell/phone within reach Nurse Communication: Mobility status PT Visit Diagnosis: Difficulty in walking, not elsewhere classified (R26.2)    Time: 9381-0175 PT Time Calculation (min) (ACUTE ONLY): 16 min   Charges:   PT Evaluation $PT Eval Low Complexity: 1 Low        Kati PT, DPT Acute Rehabilitation Services Pager: [phone] Office: [phone]  York Ram E 08/14/2020, 12:15 PM

## 2020-08-14 NOTE — Progress Notes (Signed)
[facility] Progress Note  CC:   Acute on chronic pancreatitis  Subjective:  He did not sleep well last night, doesn't sleep well at home either. No N/V or abdominal pain. He complains of right hip and back pain. He is tolerating a clear liquid diet. No BM since admission. Passing gas per the rectum. No CP. He had some SOB during the night, stated he wore oxygen nasal canula during night. No current SOB.   Objective:  Vital signs in last 24 hours: Temp:  [98.5 F (36.9 C)-99.3 F (37.4 C)] 98.5 F (36.9 C) (06/22 0415) Pulse Rate:  [84-90] 86 (06/22 0415) Resp:  [16] 16 (06/22 0415) BP: (136-159)/(47-60) 159/47 (06/22 0415) SpO2:  [92 %-94 %] 92 % (06/22 0415) Weight:  [97.1 kg] 97.1 kg (06/22 0417) Last BM Date: 08/13/20  General: Alert 86 year old male in NAD.  Heart: RRR, no murmur.  Pulm:  Few expiratory wheezes, diminished in the bases.  Abdomen: Soft with gaseous distension. Nontender. No mass. Extensive midline abdominal scar intact. No HSM.  Extremities:  No lower extremity edema.  Neurologic:  Alert and  oriented x 4. Speech is clear. Moves all extremities.  Psych:  Alert and cooperative. Normal mood and affect.  Intake/Output from previous day: 06/21 0701 - 06/22 0700 In: 3225.6 [P.O.:360; I.V.:2865.6] Out: -  Intake/Output this shift: No intake/output data recorded.  Lab Results: Recent Labs    08/12/20 0006 08/14/20 0354  WBC 12.2* 7.5  HGB 11.6* 9.5*  HCT 36.8* 29.9*  PLT 421* 294   BMET Recent Labs    08/12/20 0006 08/13/20 0446 08/14/20 0354  NA 138 138 138  K 4.8 4.0 3.7  CL 100 102 105  CO2 28 28 25   GLUCOSE 132* 98 119*  BUN 16 15 13   CREATININE 1.76* 1.74* 1.58*  CALCIUM 9.9 8.6* 8.5*   LFT Recent Labs    08/14/20 0354  PROT 6.4*  ALBUMIN 2.6*  AST 20  ALT 11  ALKPHOS 87  BILITOT 0.6   PT/INR Recent Labs    08/12/20 0006  LABPROT 15.3*  INR 1.2   Hepatitis Panel No results for input(s): HEPBSAG,  HCVAB, HEPAIGM, HEPBIGM in the last 72 hours.  No results found.  Assessment / Plan:  41. 86 year old male admitted to the hospital 08/11/2020 secondary to N/V with acute on chronic pancreatitis. EUS 07/18/2020 showed chronic pancreatitis changes of the tail and had some associated intramural gastric fluid collections. Cytology showed necrotic debris from the intramural gastric cystic lesions and biopsies of the pancreatic tail showed some acute inflammation with atypical cells, no malignancy. CT scan 08/12/2020 showed some acute on chronic changes, more so in the tail with early fluid collection along the greater curvature of the gastric lumen, no definite pseudocyst formation. Lipase 67. Tgs 74. Normal LFTs. Ca++ 8.5. No Etoh use.  -IgG4 level in am -Continue Clear liquid diet  -Eventual repeat EUS -Decrease IV fluids 75 cc/hr -Continue Ondansetron 4mg  po or IV Q 6 hours  -Continue Dilaudid Q 3 hrs PRN -OOB -> chair with assistance  -Further recommendation per Dr. Havery Moros   2. Epigastric pain. No current pain. EGD 06/21/2020 showed an incidental esophageal stricture, normal stomach.  -Pantoprazole 40mg  po bid  3. Normocytic anemia. Hg 9.5. (Hg level 11.6 on 08/12/2020. Hg 10.2 on 06/23/2020). No overt GI bleeding. Aggressive IV hydration likely dilutional component.  -CBC, iron panel and B12 add to am collection   4. CKD.  History of renal carcinoma. Cr 1.74 -> 1.58  5. S/P colectomy secondary to colon perforation 2013  6. CVA  2011 on Plavix and ASA   7. CAD, MI 2012 s/p DES x 2  8. COPD  9. 2.2 cm right liver lesion, likely hemangioma. Normal LFTs.  -AFP in am     Principal Problem:   Acute pancreatitis Active Problems:   Coronary artery disease due to lipid rich plaque   COPD (chronic obstructive pulmonary disease) (HCC)   Hypertension   Atrial fibrillation (HCC)   COPD exacerbation (HCC)   CKD (chronic kidney disease), stage III (HCC)   Abnormal finding on GI tract  imaging     LOS: 2 days   Noralyn Pick  08/14/2020, 8:20 AM

## 2020-08-14 NOTE — Plan of Care (Signed)
  Problem: Health Behavior/Discharge Planning: Goal: Ability to manage health-related needs will improve Outcome: Progressing   Problem: Clinical Measurements: Goal: Will remain free from infection Outcome: Progressing Goal: Respiratory complications will improve Outcome: Progressing   Problem: Activity: Goal: Risk for activity intolerance will decrease Outcome: Progressing   Problem: Nutrition: Goal: Adequate nutrition will be maintained Outcome: Progressing   Problem: Pain Managment: Goal: General experience of comfort will improve Outcome: Progressing

## 2020-08-14 NOTE — Progress Notes (Signed)
Progress Note    Wayne Meadows   UQJ:[id number]  DOB: [birth year]  DOA: 08/11/2020     2  PCP: System, Provider Not In  CC: abd pain  Hospital Course:  86 year old male with history of COPD, CAD status post cardiac cath and stenting on 07/09/2020, hypertension, CKD stage III, unspecified CVA, renal carcinoma, status post partial colectomy in 2013 following colon perforation from a colonoscopy, recent pancreatitis requiring outpatient EUS on 07/18/2020 showing changes of chronic pancreatitis of the tail and some associated intramural gastric fluid collections presented again on 08/12/2020 with worsening abdominal pain and imaging showed findings of acute on chronic pancreatitis with no definite pseudocyst formation.  GI was consulted.  He was started on IV fluids.  Interval History:  No events overnight.  Sitting up in recliner comfortable, no questions or concerns this morning.  ROS: Constitutional: negative for chills and fevers, Respiratory: negative for cough, Cardiovascular: negative for chest pain, and Gastrointestinal: negative for abdominal pain  Assessment & Plan: Acute on chronic pancreatitis -Patient had recent pancreatitis requiring outpatient EUS on 07/18/2020 showing changes of chronic pancreatitis of the tail and some associated intramural gastric fluid collections -Presented again with worsening abdominal pain and imaging showed findings of acute on chronic pancreatitis with no definite pseudocyst formation. -GI following.  Continue IV fluids, pain management, antiemetics as needed.  Diet advancement as per GI.  Advance to full liquids today and if tolerates plan is for soft diet tomorrow and probable discharging home - Outpatient follow-up with GI for repeat MRCP in approximately 3 weeks - GI recommending for RD consult to educate patient on low residue diet; consult placed    COPD -Stable.  Continue current nebs.  Hypertension -Continue amlodipine, clonidine,  enalapril and metoprolol  Hyperlipidemia - continue statin  CAD -Patient had a recent cardiac cath and stenting on 07/09/2020. -Continue aspirin, beta-blocker, statin.  Outpatient follow-up with cardiology.  Hypothyroidism -Continue levothyroxine   CKD stage IIIb next-creatinine stable  Leukocytosis -Possibly reactive.  Repeat a.m. labs  Diabetes mellitus type 2 -A1c 7.2 last month.  Continue CBGs with SSI  Old records reviewed in assessment of this patient  Antimicrobials:   DVT prophylaxis: enoxaparin (LOVENOX) injection 40 mg Start: 08/12/20 1000   Code Status:   Code Status: Full Code Family Communication:   Disposition Plan: Status is: Inpatient  Remains inpatient appropriate because:Inpatient level of care appropriate due to severity of illness  Dispo: The patient is from: Home              Anticipated d/c is to: Home              Patient currently is not medically stable to d/c.   Difficult to place patient No   Risk of unplanned readmission score: Unplanned Admission- Pilot do not use: 25.26   Objective: Blood pressure (!) 151/68, pulse 81, temperature 98.7 F (37.1 C), temperature source Oral, resp. rate 19, height 6\' 2"  (1.88 m), weight 97.1 kg, SpO2 96 %.  Examination: General appearance: alert, cooperative, and no distress Head: Normocephalic, without obvious abnormality, atraumatic Eyes:  EOMI Lungs: clear to auscultation bilaterally Heart: regular rate and rhythm and S1, S2 normal Abdomen: normal findings: bowel sounds normal and soft, non-tender Extremities:  no edema Skin: mobility and turgor normal Neurologic: Grossly normal  Consultants:  GI  Procedures:    Data Reviewed: I have personally reviewed following labs and imaging studies Results for orders placed or performed during the hospital encounter of 08/11/20 (from  the past 24 hour(s))  Glucose, capillary     Status: Abnormal   Collection Time: 08/13/20  8:30 PM  Result Value Ref  Range   Glucose-Capillary 143 (H) 70 - 99 mg/dL  CBC with Differential/Platelet     Status: Abnormal   Collection Time: 08/14/20  3:54 AM  Result Value Ref Range   WBC 7.5 4.0 - 10.5 K/uL   RBC 3.28 (L) 4.22 - 5.81 MIL/uL   Hemoglobin 9.5 (L) 13.0 - 17.0 g/dL   HCT 29.9 (L) 39.0 - 52.0 %   MCV 91.2 80.0 - 100.0 fL   MCH 29.0 26.0 - 34.0 pg   MCHC 31.8 30.0 - 36.0 g/dL   RDW 14.6 11.5 - 15.5 %   Platelets 294 150 - 400 K/uL   nRBC 0.0 0.0 - 0.2 %   Neutrophils Relative % 67 %   Neutro Abs 5.0 1.7 - 7.7 K/uL   Lymphocytes Relative 19 %   Lymphs Abs 1.4 0.7 - 4.0 K/uL   Monocytes Relative 9 %   Monocytes Absolute 0.7 0.1 - 1.0 K/uL   Eosinophils Relative 4 %   Eosinophils Absolute 0.3 0.0 - 0.5 K/uL   Basophils Relative 1 %   Basophils Absolute 0.1 0.0 - 0.1 K/uL   Immature Granulocytes 0 %   Abs Immature Granulocytes 0.02 0.00 - 0.07 K/uL  Comprehensive metabolic panel     Status: Abnormal   Collection Time: 08/14/20  3:54 AM  Result Value Ref Range   Sodium 138 135 - 145 mmol/L   Potassium 3.7 3.5 - 5.1 mmol/L   Chloride 105 98 - 111 mmol/L   CO2 25 22 - 32 mmol/L   Glucose, Bld 119 (H) 70 - 99 mg/dL   BUN 13 8 - 23 mg/dL   Creatinine, Ser 1.58 (H) 0.61 - 1.24 mg/dL   Calcium 8.5 (L) 8.9 - 10.3 mg/dL   Total Protein 6.4 (L) 6.5 - 8.1 g/dL   Albumin 2.6 (L) 3.5 - 5.0 g/dL   AST 20 15 - 41 U/L   ALT 11 0 - 44 U/L   Alkaline Phosphatase 87 38 - 126 U/L   Total Bilirubin 0.6 0.3 - 1.2 mg/dL   GFR, Estimated 43 (L) >60 mL/min   Anion gap 8 5 - 15  Magnesium     Status: None   Collection Time: 08/14/20  3:54 AM  Result Value Ref Range   Magnesium 1.8 1.7 - 2.4 mg/dL  Glucose, capillary     Status: Abnormal   Collection Time: 08/14/20  7:28 AM  Result Value Ref Range   Glucose-Capillary 108 (H) 70 - 99 mg/dL  Vitamin B12     Status: None   Collection Time: 08/14/20 10:04 AM  Result Value Ref Range   Vitamin B-12 240 180 - 914 pg/mL  Folate     Status: None    Collection Time: 08/14/20 10:04 AM  Result Value Ref Range   Folate 14.7 >5.9 ng/mL  Iron and TIBC     Status: Abnormal   Collection Time: 08/14/20 10:04 AM  Result Value Ref Range   Iron 35 (L) 45 - 182 ug/dL   TIBC 207 (L) 250 - 450 ug/dL   Saturation Ratios 17 (L) 17.9 - 39.5 %   UIBC 172 ug/dL  Ferritin     Status: None   Collection Time: 08/14/20 10:04 AM  Result Value Ref Range   Ferritin 161 24 - 336 ng/mL  Reticulocytes  Status: Abnormal   Collection Time: 08/14/20 10:04 AM  Result Value Ref Range   Retic Ct Pct 1.8 0.4 - 3.1 %   RBC. 3.37 (L) 4.22 - 5.81 MIL/uL   Retic Count, Absolute 60.0 19.0 - 186.0 K/uL   Immature Retic Fract 17.7 (H) 2.3 - 15.9 %  Glucose, capillary     Status: Abnormal   Collection Time: 08/14/20 11:48 AM  Result Value Ref Range   Glucose-Capillary 133 (H) 70 - 99 mg/dL  Glucose, capillary     Status: Abnormal   Collection Time: 08/14/20  4:23 PM  Result Value Ref Range   Glucose-Capillary 103 (H) 70 - 99 mg/dL    Recent Results (from the past 240 hour(s))  Resp Panel by RT-PCR (Flu A&B, Covid) Nasopharyngeal Swab     Status: None   Collection Time: 08/12/20  4:08 AM   Specimen: Nasopharyngeal Swab; Nasopharyngeal(NP) swabs in vial transport medium  Result Value Ref Range Status   SARS Coronavirus 2 by RT PCR NEGATIVE NEGATIVE Final    Comment: (NOTE) SARS-CoV-2 target nucleic acids are NOT DETECTED.  The SARS-CoV-2 RNA is generally detectable in upper respiratory specimens during the acute phase of infection. The lowest concentration of SARS-CoV-2 viral copies this assay can detect is 138 copies/mL. A negative result does not preclude SARS-Cov-2 infection and should not be used as the sole basis for treatment or other patient management decisions. A negative result may occur with  improper specimen collection/handling, submission of specimen other than nasopharyngeal swab, presence of viral mutation(s) within the areas targeted by  this assay, and inadequate number of viral copies(<138 copies/mL). A negative result must be combined with clinical observations, patient history, and epidemiological information. The expected result is Negative.  Fact Sheet for Patients:  EntrepreneurPulse.com.au  Fact Sheet for Healthcare Providers:  IncredibleEmployment.be  This test is no t yet approved or cleared by the [place] FDA and  has been authorized for detection and/or diagnosis of SARS-CoV-2 by FDA under an Emergency Use Authorization (EUA). This EUA will remain  in effect (meaning this test can be used) for the duration of the COVID-19 declaration under Section 564(b)(1) of the Act, 21 U.S.C.section 360bbb-3(b)(1), unless the authorization is terminated  or revoked sooner.       Influenza A by PCR NEGATIVE NEGATIVE Final   Influenza B by PCR NEGATIVE NEGATIVE Final    Comment: (NOTE) The Xpert Xpress SARS-CoV-2/FLU/RSV plus assay is intended as an aid in the diagnosis of influenza from Nasopharyngeal swab specimens and should not be used as a sole basis for treatment. Nasal washings and aspirates are unacceptable for Xpert Xpress SARS-CoV-2/FLU/RSV testing.  Fact Sheet for Patients: EntrepreneurPulse.com.au  Fact Sheet for Healthcare Providers: IncredibleEmployment.be  This test is not yet approved or cleared by the [place] FDA and has been authorized for detection and/or diagnosis of SARS-CoV-2 by FDA under an Emergency Use Authorization (EUA). This EUA will remain in effect (meaning this test can be used) for the duration of the COVID-19 declaration under Section 564(b)(1) of the Act, 21 U.S.C. section 360bbb-3(b)(1), unless the authorization is terminated or revoked.  Performed at [facility], [place] [address] [place], [place] [address]      Radiology Studies: No results found. CT Angio Chest  Pulmonary Embolism (PE) W or WO Contrast  Final Result    CT ABDOMEN PELVIS W CONTRAST  Final Result      Scheduled Meds:  amLODipine  10 mg Oral Daily  aspirin EC  81 mg Oral Daily   budesonide  0.25 mg Nebulization QHS   cloNIDine  0.1 mg Oral Daily   clopidogrel  75 mg Oral Daily   enalapril  20 mg Oral Daily   And   enalapril  10 mg Oral QHS   enoxaparin (LOVENOX) injection  40 mg Subcutaneous Q24H   gabapentin  300 mg Oral BID WC   And   gabapentin  600 mg Oral QHS   insulin aspart  0-15 Units Subcutaneous TID WC   ipratropium-albuterol  3 mL Nebulization QID   levothyroxine  50 mcg Oral QAC breakfast   metoprolol tartrate  25 mg Oral Daily   pravastatin  40 mg Oral QHS   PRN Meds: HYDROmorphone (DILAUDID) injection Continuous Infusions:  lactated ringers 75 mL/hr at 08/14/20 0918     LOS: 2 days  Time spent: Greater than 50% of the 35 minute visit was spent in counseling/coordination of care for the patient as laid out in the A&P.   [name], MD [facility] 08/14/2020, 5:38 PM

## 2020-08-15 ENCOUNTER — Telehealth: Payer: Self-pay

## 2020-08-15 ENCOUNTER — Other Ambulatory Visit: Payer: Self-pay

## 2020-08-15 DIAGNOSIS — K8500 Idiopathic acute pancreatitis without necrosis or infection: Secondary | ICD-10-CM

## 2020-08-15 LAB — GLUCOSE, CAPILLARY
Glucose-Capillary: 112 mg/dL — ABNORMAL HIGH (ref 70–99)
Glucose-Capillary: 141 mg/dL — ABNORMAL HIGH (ref 70–99)

## 2020-08-15 MED ORDER — POLYETHYLENE GLYCOL 3350 17 G PO PACK: 17.0000 g | PACK | Freq: Every day | ORAL | Status: DC | PRN

## 2020-08-15 NOTE — Progress Notes (Signed)
NUTRITION NOTE  Secure chat message received from MD that plan is for patient to d/c home today and request for low-residue diet education. Due to staffing constraints, RD may not be able to see patient today, but will add handouts from the [employer] to Discharge Instructions and will monitor for ability to see patient as the day progresses. MD aware of these things.      Jarome Matin, MS, RD, LDN, CNSC Inpatient Clinical Dietitian RD pager # available in [place]  After hours/weekend pager # available in [facility]

## 2020-08-15 NOTE — Plan of Care (Signed)
Discharge instructions reviewed with patient, questions answered, verbalized understanding.  Patient transported to ED entrance to drive himself home.

## 2020-08-15 NOTE — Telephone Encounter (Signed)
Can he be put with an APP and further out than the 3 to 4 week window? Thanks

## 2020-08-15 NOTE — Progress Notes (Signed)
PT Cancellation Note  Patient Details Name: Wayne Meadows MRN: [id number] DOB: 09/19/1934   Cancelled Treatment:    Reason Eval/Treat Not Completed: Patient declined, no reason specified.  Pt declined reporting he is discharging home today.   Marquasia Schmieder,KATHrine E 08/15/2020, 12:10 PM Jannette Spanner PT, DPT Acute Rehabilitation Services Pager: [phone] Office: [phone]

## 2020-08-15 NOTE — Discharge Instructions (Addendum)
Fiber-Restricted (13 grams) Nutrition Therapy A fiber-restricted diet contains less than 13 grams of fiber daily.  Your registered dietitian nutritionist (RDN) or health care provider may suggest you eat less fiber if you have Crohn's disease or ulcerative colitis and are in a flare or are taking prednisone or budesonide medications.  You might also be prescribed this diet if you have irritable bowel syndrome with diarrhea or if you are recovering from gastrointestinal surgery.  As your symptoms and condition get better, your RDN or health care provider will help you add more fiber to your diet.  It's also important to eat enough protein foods while you are on a fiber-restricted diet. A fiber-restricted diet includes limited amounts of foods that your body cannot digest.  This diet should help you slow the movement of food in your intestines and lower the amount and bulk of your stool. It may also help with your diarrhea, stomach pain, gas and bloating. A fiber-restricted diet may be low in some nutrients, because a variety of foods are limited to reduce symptoms.  Take a chewable multivitamin with minerals to make sure you are getting enough nutrients.  You might need calcium with vitamin D supplements too if you're not able to eat enough calcium and vitamin D in your diet.  Tips Eat about 5 to 6 small meals every 3 or 4 hours daily. Eat a protein food or dairy product at every meal or snack if your body can tolerate it.  See the Foods Recommended table for ideas. Avoid acidic, spicy, fried, greasy and high-fat foods. You may need to limit foods/beverages that contain: Sugar Lactose. Try lactose-free products to reduce symptoms of gas or bloating. Fructose High-fructose corn syrup Sugar-free sweeteners such as aspartame, sucralose, or sorbitol Caffeine Do not eat whole grains, seeds, fruit and vegetable peels or skins, whole nuts, raw vegetables, most raw fruits and the connective tissues  of meats. If you have a stricture, avoid all whole grains, raw fruits and raw vegetables and switch to a low-fiber diet (less than 8 grams fiber daily). Take calcium with vitamin D supplements at a different time than the multivitamin with minerals. All vitamin and mineral supplements should be taken with food. Choose foods that have been safely handled and prepared to lower your risk of foodborne illness.  These suggestions help most people with symptoms.  However, if your symptoms get worse after eating specific foods on this list, you should stop eating them until you recover.  Foods Recommended These foods are low in fiber and may help your symptoms.  However, if your symptoms get worse after eating specific foods on this list, you should stop eating them until you recover  Food Group Foods Recommended Grains Grain foods with less than 2 grams fiber per serving White flour Bread, bagels, rolls, crackers, and pasta made from white or refined flour Cold or hot cereals made from white or refined flour such as corn flakes, crispy rice, puffed rice, cream of wheat, cream of rice, or refined grits Protein Foods Tender, well-cooked, lean meats made without added fat: beef, fish, lamb, pork, or poultry Lean deli meats (heated to steaming) Well-cooked eggs Tofu Smooth nut butters: almond, peanut, or sunflower Dairy If you have lactose intolerance, drinking milk products from cows or goats may make diarrhea worse. Foods marked with an asterisk (*) have lactose. Buttermilk* Fat-free, 1%, and 2% milk* Lactose-free milk Powdered milk and evaporated milk* Fortified non-dairy milks: almond, cashew, coconut, or rice (be aware that  these options are not good sources of protein so you will need to eat an additional protein food) Fortified pea milk and soymilk (may cause gas and bloating in some people) Yogurt* with live active cultures without fruit, granola, or nuts Lactose-free yogurt Kefir  (many are 99% lactose-free) Cheese*: cheddar, Swiss, Parmesan (low-fat, block, hard and aged cheese are usually lower in lactose) Low-fat ice cream* Lactose-free ice cream Cottage cheese* Lactose-free cottage cheese Vegetables See the Foods Not Recommended table for vegetables to avoid Well-cooked vegetables without seeds or skins Potatoes without skin: white, red and yellow Small amounts of sweet potatoes without skin may be added as fiber is increased in the diet Strained vegetable juice Fruit Fruit juice, except for prune juice Ripe bananas Melons: cantaloupe, honeydew or watermelon Peeled apple; a baked apple will have less fiber than a fresh apple Canned soft fruits in juice, avoid pineapple Oils Limit fats and oils to less than 8 teaspoons per day. Choose oils (olive, canola) more often than solid fats Beverages Healthy people need 8 to 10 cups of fluid each day which mainly is recommended to be plain water; coffee, tea and water with added flavor packets are not included in this recommendation as these often increase symptoms.  You may need to drink more to replace fluids lost from diarrhea. Decaffeinated coffee Caffeine-free teas Rehydration beverages     Fat-Restricted Nutrition Therapy A fat-restricted diet can help if you have trouble digesting or absorbing fat.  This nutrition therapy will help prevent uncomfortable side effects, such as diarrhea, bloating, and cramping, that may occur when you consume high-fat foods.  In addition, this nutrition therapy may help you absorb important nutrients in your diet.  Important Points to Keep in Mind Keep the total amount of fat that you eat (including heart-healthy fats) to 25% to 35% of the calories that you eat.  If you should eat 2,000 calories per day, you can have between 50 and 75 grams of fat per day. Limit saturated fats and trans fats: Foods high in saturated fats include marbled (fatty) meat, poultry skin, bacon,  sausage, whole milk, cream, and butter. Trans fats are found in stick margarine, shortening, some fried foods, baked goods, pastries, and packaged foods made with hydrogenated oils. If you eat these foods, have them only once in a while and in small amounts.  Try to use oils instead of butter or stick margarine whenever possible, or use reduced-fat, whipped, or liquid spreads. Lactose (the sugar in milk), dairy products, and dietary fiber in foods can also cause diarrhea, bloating, and cramping.  Keeping a food journal and recording your symptoms may help you better understand which foods are causing your symptoms.  Foods Recommended  Dairy  Fat-free (skim), low-fat (1%) milk or buttermilk Fat-free or low-fat yogurt or cottage cheese; lactose-free yogurt or lactose-free cottage cheese Fat-free and low-fat cheese Fat-free, lactose-free milk and lactose-free, low-fat ice cream/frozen yogurt and sour cream may be tolerated better Fruits and Vegetables  Fresh, frozen, canned, or dried fruit Fresh, frozen, or canned vegetables without added fat or salt Grains  Whole grain breads and cereals, including oats and barley Pasta, especially whole wheat or other whole grain types Brown rice Low-fat crackers and pretzels Proteins  Lean cuts of beef and pork (loin, leg, round, extra-lean hamburger) Skinless Cytogeneticist and other wild game Dried beans and peas Meat alternatives made with soy or textured vegetable protein Egg whites or egg substitute Cold cuts made with lean meat or  soy protein Fats and Oils (Use sparingly) Unsaturated oils (olive, peanut, soy, sunflower, canola) Soft or liquid margarines and vegetable oil spreads Salad dressings, seeds and nuts, avocado   Foods Not Recommended  Dairy  Whole milk and reduced-fat (2%) milk Whole milk yogurt or ice cream Cream Half-and-half Cream cheese Sour cream Cheese Fruits and Vegetables  Fried fruits or vegetables Fruit  served with butter or cream Vegetables prepared with butter, cheese, or cream sauce. Grains  High-fat bakery products, such as doughnuts, biscuits, croissants, danish pastries, pies, cookies Snacks made with partially hydrogenated oils, including chips, cheese puffs, snack mixes, regular crackers, butter-flavored popcorn Proteins  Higher-fat cuts of meats (ribs, T-bone steak, regular hamburger) Berniece Salines Sausage Cold cuts, such as salami or bologna Corned beef Hot dogs Organ meats (liver, brains, sweetbreads) Poultry with skin Fried meat, poultry, and fish Whole eggs and egg yolks Fats and Oils  Butter Stick margarine Shortening Partially hydrogenated oils Tropical oils (coconut, palm, and palm kernel oils

## 2020-08-15 NOTE — Discharge Summary (Signed)
Physician Discharge Summary   Wayne Meadows:[id number] DOB: 08/07/1934 DOA: 08/11/2020  PCP: System, Provider Not In  Admit date: 08/11/2020 Discharge date: 08/15/2020   Admitted From: home Disposition:  home Discharging physician: [name], MD  Recommendations for Outpatient Follow-up:  Follow up with GI  Home Health:  Equipment/Devices:   Patient discharged to home in Discharge Condition: stable Risk of unplanned readmission score: Unplanned Admission- Pilot do not use: 25.54  CODE STATUS: Full Diet recommendation:  Diet Orders (From admission, onward)     Start     Ordered   08/15/20 0833  DIET SOFT Room service appropriate? Yes; Fluid consistency: Thin  Diet effective now       Question Answer Comment  Room service appropriate? Yes   Fluid consistency: Thin      08/15/20 0832            Hospital Course:  86 year old male with history of COPD, CAD status post cardiac cath and stenting on 07/09/2020, hypertension, CKD stage III, unspecified CVA, renal carcinoma, status post partial colectomy in 2013 following colon perforation from a colonoscopy, recent pancreatitis requiring outpatient EUS on 07/18/2020 showing changes of chronic pancreatitis of the tail and some associated intramural gastric fluid collections presented again on 08/12/2020 with worsening abdominal pain and imaging showed findings of acute on chronic pancreatitis with no definite pseudocyst formation.  GI was consulted.  He was started on IV fluids.  Acute on chronic pancreatitis -Patient had recent pancreatitis requiring outpatient EUS on 07/18/2020 showing changes of chronic pancreatitis of the tail and some associated intramural gastric fluid collections -Presented again with worsening abdominal pain and imaging showed findings of acute on chronic pancreatitis with no definite pseudocyst formation. -Responded well to bowel rest with fluids followed by slow advancement of diet up to soft  diet/low residue diet prior to discharge - Dietitian also consulted prior to discharge for diet education per GI recommendations -Patient will follow up outpatient with GI for repeat MRCP   COPD -Stable.  Continue current nebs.  Hypertension -Continue amlodipine, clonidine, enalapril and metoprolol  Hyperlipidemia - continue statin  CAD -Patient had a recent cardiac cath and stenting on 07/09/2020. -Continue aspirin, beta-blocker, statin.  Outpatient follow-up with cardiology.  Hypothyroidism -Continue levothyroxine   CKD stage IIIb next-creatinine stable  Leukocytosis -Possibly reactive.   Diabetes mellitus type 2 -A1c 7.2 last month.  Continue CBGs with SSI   The patient's chronic medical conditions were treated accordingly per the patient's home medication regimen except as noted.  On day of discharge, patient was felt deemed stable for discharge. Patient/family member advised to call PCP or come back to ER if needed.   Principal Diagnosis: Acute pancreatitis  Discharge Diagnoses: Active Hospital Problems   Diagnosis Date Noted   Acute pancreatitis 08/12/2020   Abnormal finding on GI tract imaging    COPD exacerbation ([place]) 04/11/2016   CKD (chronic kidney disease), stage III ([place]) 04/11/2016   Atrial fibrillation ([place]) 04/26/2011   Hypertension 03/19/2011   Coronary artery disease due to lipid rich plaque 02/22/2011   COPD (chronic obstructive pulmonary disease) ([place]) 02/22/2011    Resolved Hospital Problems  No resolved problems to display.    Discharge Instructions     Increase activity slowly   Complete by: As directed       Allergies as of 08/15/2020       Reactions   Bee Venom Anaphylaxis   Has epi pen   Influenza Vaccines Other (See Comments)   "  Mortally sick for 2 weeks"        Medication List     TAKE these medications    albuterol 108 (90 Base) MCG/ACT inhaler Commonly known as: VENTOLIN HFA Inhale 1 puff into the lungs every 6  (six) hours as needed for shortness of breath.   amLODipine 10 MG tablet Commonly known as: NORVASC Take 1 tablet (10 mg total) by mouth daily. Notes to patient: 08/16/2020    aspirin EC 81 MG tablet Take 81 mg by mouth daily. Notes to patient: 08/16/2020    baclofen 10 MG tablet Commonly known as: LIORESAL Take 10 mg by mouth at bedtime. Notes to patient: 08/15/2020 bedtime    cloNIDine 0.1 MG tablet Commonly known as: CATAPRES Take 1 tablet (0.1 mg total) by mouth daily. Notes to patient: 08/16/2020    clopidogrel 75 MG tablet Commonly known as: PLAVIX Take 75 mg by mouth daily. Notes to patient: 08/16/2020    diclofenac Sodium 1 % Gel Commonly known as: VOLTAREN Apply 2 g topically 4 (four) times daily.   enalapril 20 MG tablet Commonly known as: VASOTEC Take 20 mg by mouth 2 (two) times daily. 1 whole tablet in the morning, and 1/2 tablet in the evenings Notes to patient: 08/15/2020 evening dose    EPINEPHrine 0.3 mg/0.3 mL Devi Commonly known as: EPI-PEN Inject 0.3 mg into the muscle once as needed (for anaphylactic reaction).   ferrous sulfate 325 (65 FE) MG tablet Take 1 tablet by mouth. Every Monday, Wednesday, and Friday Notes to patient: Friday 08/16/2020    gabapentin 300 MG capsule Commonly known as: NEURONTIN Take 300-600 mg by mouth See admin instructions. Take 300 mg by mouth in the morning then 300 mg at noon then 600 mg at bedtime Notes to patient: 08/15/2020 bedtime    HYDROcodone-acetaminophen 10-325 MG tablet Commonly known as: NORCO Take 1 tablet by mouth 2 (two) times daily.   Ipratropium-Albuterol 20-100 MCG/ACT Aers respimat Commonly known as: COMBIVENT Inhale 1 puff into the lungs 4 (four) times daily. Notes to patient: 08/15/2020 2 more times today    levothyroxine 50 MCG tablet Commonly known as: SYNTHROID Take 50 mcg by mouth daily before breakfast. Notes to patient: 08/16/2020    melatonin 3 MG Tabs tablet Take 6 mg by mouth at  bedtime. Notes to patient: 08/15/2020 bedtime    metoprolol tartrate 25 MG tablet Commonly known as: LOPRESSOR Take 25 mg by mouth daily. Notes to patient: 08/16/2020    mometasone 220 MCG/INH inhaler Commonly known as: ASMANEX Inhale 1 puff into the lungs at bedtime. Notes to patient: 08/15/2020 bedtime    multivitamin tablet Take 1 tablet by mouth daily. Notes to patient: 08/16/2020    nitroGLYCERIN 0.4 MG SL tablet Commonly known as: NITROSTAT Place 0.4 mg under the tongue every 5 (five) minutes as needed for chest pain.   ondansetron 4 MG tablet Commonly known as: ZOFRAN Take 1 tablet (4 mg total) by mouth every 6 (six) hours as needed for nausea.   pantoprazole 40 MG tablet Commonly known as: PROTONIX Take 1 tablet (40 mg total) by mouth 2 (two) times daily. Notes to patient: 08/15/2020 evening dose    polyethylene glycol 17 g packet Commonly known as: MIRALAX / GLYCOLAX Take 17 g by mouth daily as needed for mild constipation or moderate constipation.   pravastatin 40 MG tablet Commonly known as: PRAVACHOL Take 40 mg by mouth at bedtime. Notes to patient: 08/15/2020 bedtime    vitamin B-12 500 MCG  tablet Commonly known as: CYANOCOBALAMIN Take 2 tablets by mouth every Monday, Wednesday, and Friday. Notes to patient: Friday 08/16/2020         Allergies  Allergen Reactions   Bee Venom Anaphylaxis    Has epi pen   Influenza Vaccines Other (See Comments)    "Mortally sick for 2 weeks"    Consultations:   Discharge Exam: BP (!) 164/70 (BP Location: Right Arm)   Pulse 83   Temp 98.3 F (36.8 C) (Oral)   Resp 18   Ht 6\' 2"  (1.88 m)   Wt 89.1 kg   SpO2 93%   BMI 25.22 kg/m  General appearance: alert, cooperative, and no distress Head: Normocephalic, without obvious abnormality, atraumatic Eyes:  EOMI Lungs: clear to auscultation bilaterally Heart: regular rate and rhythm and S1, S2 normal Abdomen: normal findings: bowel sounds normal and soft,  non-tender Extremities:  no edema Skin: mobility and turgor normal Neurologic: Grossly normal  The results of significant diagnostics from this hospitalization (including imaging, microbiology, ancillary and laboratory) are listed below for reference.   Microbiology: Recent Results (from the past 240 hour(s))  Resp Panel by RT-PCR (Flu A&B, Covid) Nasopharyngeal Swab     Status: None   Collection Time: 08/12/20  4:08 AM   Specimen: Nasopharyngeal Swab; Nasopharyngeal(NP) swabs in vial transport medium  Result Value Ref Range Status   SARS Coronavirus 2 by RT PCR NEGATIVE NEGATIVE Final    Comment: (NOTE) SARS-CoV-2 target nucleic acids are NOT DETECTED.  The SARS-CoV-2 RNA is generally detectable in upper respiratory specimens during the acute phase of infection. The lowest concentration of SARS-CoV-2 viral copies this assay can detect is 138 copies/mL. A negative result does not preclude SARS-Cov-2 infection and should not be used as the sole basis for treatment or other patient management decisions. A negative result may occur with  improper specimen collection/handling, submission of specimen other than nasopharyngeal swab, presence of viral mutation(s) within the areas targeted by this assay, and inadequate number of viral copies(<138 copies/mL). A negative result must be combined with clinical observations, patient history, and epidemiological information. The expected result is Negative.  Fact Sheet for Patients:  EntrepreneurPulse.com.au  Fact Sheet for Healthcare Providers:  IncredibleEmployment.be  This test is no t yet approved or cleared by the [place] FDA and  has been authorized for detection and/or diagnosis of SARS-CoV-2 by FDA under an Emergency Use Authorization (EUA). This EUA will remain  in effect (meaning this test can be used) for the duration of the COVID-19 declaration under Section 564(b)(1) of the Act,  21 U.S.C.section 360bbb-3(b)(1), unless the authorization is terminated  or revoked sooner.       Influenza A by PCR NEGATIVE NEGATIVE Final   Influenza B by PCR NEGATIVE NEGATIVE Final    Comment: (NOTE) The Xpert Xpress SARS-CoV-2/FLU/RSV plus assay is intended as an aid in the diagnosis of influenza from Nasopharyngeal swab specimens and should not be used as a sole basis for treatment. Nasal washings and aspirates are unacceptable for Xpert Xpress SARS-CoV-2/FLU/RSV testing.  Fact Sheet for Patients: EntrepreneurPulse.com.au  Fact Sheet for Healthcare Providers: IncredibleEmployment.be  This test is not yet approved or cleared by the [place] FDA and has been authorized for detection and/or diagnosis of SARS-CoV-2 by FDA under an Emergency Use Authorization (EUA). This EUA will remain in effect (meaning this test can be used) for the duration of the COVID-19 declaration under Section 564(b)(1) of the Act, 21 U.S.C. section 360bbb-3(b)(1), unless the authorization  is terminated or revoked.  Performed at [facility], [place] [address] [place], [place] [address]      Labs: BNP (last 3 results) No results for input(s): BNP in the last 8760 hours. Basic Metabolic Panel: Recent Labs  Lab 08/12/20 0006 08/13/20 0446 08/14/20 0354  NA 138 138 138  K 4.8 4.0 3.7  CL 100 102 105  CO2 28 28 25   GLUCOSE 132* 98 119*  BUN 16 15 13   CREATININE 1.76* 1.74* 1.58*  CALCIUM 9.9 8.6* 8.5*  MG  --   --  1.8   Liver Function Tests: Recent Labs  Lab 08/12/20 0006 08/13/20 0446 08/14/20 0354  AST 12* 20 20  ALT 12 11 11   ALKPHOS 94 93 87  BILITOT 0.4 0.5 0.6  PROT 7.9 6.4* 6.4*  ALBUMIN 3.4* 2.6* 2.6*   Recent Labs  Lab 08/12/20 0006 08/13/20 0446  LIPASE 89* 67*   No results for input(s): AMMONIA in the last 168 hours. CBC: Recent Labs  Lab 08/12/20 0006 08/14/20 0354  WBC 12.2* 7.5  NEUTROABS  --   5.0  HGB 11.6* 9.5*  HCT 36.8* 29.9*  MCV 92.9 91.2  PLT 421* 294   Cardiac Enzymes: No results for input(s): CKTOTAL, CKMB, CKMBINDEX, TROPONINI in the last 168 hours. BNP: Invalid input(s): POCBNP CBG: Recent Labs  Lab 08/14/20 1148 08/14/20 1623 08/14/20 2119 08/15/20 0728 08/15/20 1152  GLUCAP 133* 103* 128* 112* 141*   D-Dimer No results for input(s): DDIMER in the last 72 hours. Hgb A1c No results for input(s): HGBA1C in the last 72 hours. Lipid Profile No results for input(s): CHOL, HDL, LDLCALC, TRIG, CHOLHDL, LDLDIRECT in the last 72 hours. Thyroid function studies No results for input(s): TSH, T4TOTAL, T3FREE, THYROIDAB in the last 72 hours.  Invalid input(s): FREET3 Anemia work up Recent Labs    08/14/20 1004  VITAMINB12 240  FOLATE 14.7  FERRITIN 161  TIBC 207*  IRON 35*  RETICCTPCT 1.8   Urinalysis    Component Value Date/Time   COLORURINE STRAW (A) 08/12/2020 0818   APPEARANCEUR CLEAR 08/12/2020 0818   LABSPEC 1.006 08/12/2020 0818   PHURINE 8.0 08/12/2020 0818   GLUCOSEU NEGATIVE 08/12/2020 0818   HGBUR NEGATIVE 08/12/2020 0818   BILIRUBINUR NEGATIVE 08/12/2020 0818   KETONESUR NEGATIVE 08/12/2020 0818   PROTEINUR 100 (A) 08/12/2020 0818   NITRITE NEGATIVE 08/12/2020 0818   LEUKOCYTESUR NEGATIVE 08/12/2020 0818   Sepsis Labs Invalid input(s): PROCALCITONIN,  WBC,  LACTICIDVEN Microbiology Recent Results (from the past 240 hour(s))  Resp Panel by RT-PCR (Flu A&B, Covid) Nasopharyngeal Swab     Status: None   Collection Time: 08/12/20  4:08 AM   Specimen: Nasopharyngeal Swab; Nasopharyngeal(NP) swabs in vial transport medium  Result Value Ref Range Status   SARS Coronavirus 2 by RT PCR NEGATIVE NEGATIVE Final    Comment: (NOTE) SARS-CoV-2 target nucleic acids are NOT DETECTED.  The SARS-CoV-2 RNA is generally detectable in upper respiratory specimens during the acute phase of infection. The lowest concentration of SARS-CoV-2 viral  copies this assay can detect is 138 copies/mL. A negative result does not preclude SARS-Cov-2 infection and should not be used as the sole basis for treatment or other patient management decisions. A negative result may occur with  improper specimen collection/handling, submission of specimen other than nasopharyngeal swab, presence of viral mutation(s) within the areas targeted by this assay, and inadequate number of viral copies(<138 copies/mL). A negative result must be combined with clinical observations,  patient history, and epidemiological information. The expected result is Negative.  Fact Sheet for Patients:  EntrepreneurPulse.com.au  Fact Sheet for Healthcare Providers:  IncredibleEmployment.be  This test is no t yet approved or cleared by the [place] FDA and  has been authorized for detection and/or diagnosis of SARS-CoV-2 by FDA under an Emergency Use Authorization (EUA). This EUA will remain  in effect (meaning this test can be used) for the duration of the COVID-19 declaration under Section 564(b)(1) of the Act, 21 U.S.C.section 360bbb-3(b)(1), unless the authorization is terminated  or revoked sooner.       Influenza A by PCR NEGATIVE NEGATIVE Final   Influenza B by PCR NEGATIVE NEGATIVE Final    Comment: (NOTE) The Xpert Xpress SARS-CoV-2/FLU/RSV plus assay is intended as an aid in the diagnosis of influenza from Nasopharyngeal swab specimens and should not be used as a sole basis for treatment. Nasal washings and aspirates are unacceptable for Xpert Xpress SARS-CoV-2/FLU/RSV testing.  Fact Sheet for Patients: EntrepreneurPulse.com.au  Fact Sheet for Healthcare Providers: IncredibleEmployment.be  This test is not yet approved or cleared by the [place] FDA and has been authorized for detection and/or diagnosis of SARS-CoV-2 by FDA under an Emergency Use Authorization (EUA). This  EUA will remain in effect (meaning this test can be used) for the duration of the COVID-19 declaration under Section 564(b)(1) of the Act, 21 U.S.C. section 360bbb-3(b)(1), unless the authorization is terminated or revoked.  Performed at [facility], [place] [address], [place], [place] [address]     Procedures/Studies: CT Angio Chest Pulmonary Embolism (PE) W or WO Contrast  Result Date: 08/12/2020 CLINICAL DATA:  Nonlocalized acute abdominal pain. EXAM: CT ANGIOGRAPHY CHEST CT ABDOMEN AND PELVIS WITH CONTRAST TECHNIQUE: Multidetector CT imaging of the chest was performed using the standard protocol during bolus administration of intravenous contrast. Multiplanar CT image reconstructions and MIPs were obtained to evaluate the vascular anatomy. Multidetector CT imaging of the abdomen and pelvis was performed using the standard protocol during bolus administration of intravenous contrast. CONTRAST:  31mL OMNIPAQUE IOHEXOL 350 MG/ML SOLN COMPARISON:  CT abdomen pelvis 06/20/2020, CT angio chest 07/08/2016 FINDINGS: CTA CHEST FINDINGS Cardiovascular: Satisfactory opacification of the pulmonary arteries to the segmental level. Unable to evaluate at the subsegmental level due to respiratory motion artifact (3: 209, 5:110). No evidence of pulmonary embolism. The main pulmonary artery is normal in caliber. Normal heart size. No pericardial effusion. The thoracic aorta is normal in caliber. Atherosclerotic plaque including at least 3 vessel coronary artery calcifications. Mediastinum/Nodes: No enlarged mediastinal, hilar, or axillary lymph nodes. Thyroid gland, trachea, and esophagus demonstrate no significant findings. Lungs/Pleura: Biapical pleural/pulmonary scarring. Centrilobular emphysematous changes. No focal consolidation. Stable 4 mm subpleural nodule on the right (4:16). No new pulmonary nodule. No pulmonary mass. No pleural effusion. No pneumothorax. Diffuse bronchial wall thickening.  Debris within the trachea and carina as well as the left mainstem bronchi. Musculoskeletal: No chest wall abnormality. No suspicious lytic or blastic osseous lesions. No acute displaced fracture. Multilevel degenerative changes of the spine. Review of the MIP images confirms the above findings. CT ABDOMEN and PELVIS FINDINGS Hepatobiliary: Similar-appearing peripheral enhancing 2.2 cm right hepatic lobe lesion on the pulmonary artery phase that fills in on the portal venous phase likely representing a hepatic hemangioma (3:296). Similar appearing finding measuring 0.7 cm (3:325). No focal liver abnormality. Status post cholecystectomy. No biliary dilatation. Pancreas: Calcifications throughout the pancreatic parenchyma consistent with chronic pancreatitis. Hazy pancreatic contour with associated peripancreatic fat  stranding and trace free fluid along the distal pancreas. No definite pseudocyst formation. Question early organizing fluid collection along the greater curvature of the gastric lumen (7:67, 4:26). Spleen: Normal in size without focal abnormality. Adrenals/Urinary Tract: No adrenal nodule bilaterally. Bilateral kidneys enhance symmetrically. Several similar in size in appearance fluid density lesions within the kidneys likely represent simple renal cysts. Subcentimeter hypodensities are too small to characterize. No hydronephrosis. No hydroureter. The urinary bladder is distended with urine. Stomach/Bowel: Stomach is within normal limits. No evidence of bowel wall thickening or dilatation. Diffuse colonic diverticulosis. The appendix is not definitely identified. Vascular/Lymphatic: No splenic artery aneurysm formation. No abdominal aorta or iliac aneurysm. Severe atherosclerotic plaque of the aorta and its branches. No abdominal, pelvic, or inguinal lymphadenopathy. Reproductive: Prostate is unremarkable. Other: No intraperitoneal free fluid. No intraperitoneal free gas. No organized fluid collection.  Musculoskeletal: No abdominal wall hernia or abnormality. Diffusely decreased bone density. No suspicious lytic or blastic osseous lesions. No acute displaced fracture. Similar-appearing multilevel degenerative changes of the spine with posterolateral fusion and laminectomy of the L2 through L5 levels. Partially visualized total right hip arthroplasty. Review of the MIP images confirms the above findings. IMPRESSION: 1. Acute on chronic pancreatitis. No definite pseudocyst formation. 2. No central or segmental pulmonary embolus. Unable to evaluate at the subsegmental level due to respiratory motion artifact. 3. Debris noted within the central airways as well as diffuse bronchial wall thickening. No new focal consolidation or finding of infection/inflammation within the lungs. 4. Diffuse colonic diverticulosis with no acute diverticulitis. 5. Two similar-appearing hepatic hemangioma. 6. Aortic Atherosclerosis (ICD10-I70.0) and Emphysema (ICD10-J43.9). 7. Status post cholecystectomy. Electronically Signed   By: Iven Finn M.D.   On: 08/12/2020 03:44   CT ABDOMEN PELVIS W CONTRAST  Result Date: 08/12/2020 CLINICAL DATA:  Nonlocalized acute abdominal pain. EXAM: CT ANGIOGRAPHY CHEST CT ABDOMEN AND PELVIS WITH CONTRAST TECHNIQUE: Multidetector CT imaging of the chest was performed using the standard protocol during bolus administration of intravenous contrast. Multiplanar CT image reconstructions and MIPs were obtained to evaluate the vascular anatomy. Multidetector CT imaging of the abdomen and pelvis was performed using the standard protocol during bolus administration of intravenous contrast. CONTRAST:  67mL OMNIPAQUE IOHEXOL 350 MG/ML SOLN COMPARISON:  CT abdomen pelvis 06/20/2020, CT angio chest 07/08/2016 FINDINGS: CTA CHEST FINDINGS Cardiovascular: Satisfactory opacification of the pulmonary arteries to the segmental level. Unable to evaluate at the subsegmental level due to respiratory motion artifact  (3: 209, 5:110). No evidence of pulmonary embolism. The main pulmonary artery is normal in caliber. Normal heart size. No pericardial effusion. The thoracic aorta is normal in caliber. Atherosclerotic plaque including at least 3 vessel coronary artery calcifications. Mediastinum/Nodes: No enlarged mediastinal, hilar, or axillary lymph nodes. Thyroid gland, trachea, and esophagus demonstrate no significant findings. Lungs/Pleura: Biapical pleural/pulmonary scarring. Centrilobular emphysematous changes. No focal consolidation. Stable 4 mm subpleural nodule on the right (4:16). No new pulmonary nodule. No pulmonary mass. No pleural effusion. No pneumothorax. Diffuse bronchial wall thickening. Debris within the trachea and carina as well as the left mainstem bronchi. Musculoskeletal: No chest wall abnormality. No suspicious lytic or blastic osseous lesions. No acute displaced fracture. Multilevel degenerative changes of the spine. Review of the MIP images confirms the above findings. CT ABDOMEN and PELVIS FINDINGS Hepatobiliary: Similar-appearing peripheral enhancing 2.2 cm right hepatic lobe lesion on the pulmonary artery phase that fills in on the portal venous phase likely representing a hepatic hemangioma (3:296). Similar appearing finding measuring 0.7 cm (3:325). No focal  liver abnormality. Status post cholecystectomy. No biliary dilatation. Pancreas: Calcifications throughout the pancreatic parenchyma consistent with chronic pancreatitis. Hazy pancreatic contour with associated peripancreatic fat stranding and trace free fluid along the distal pancreas. No definite pseudocyst formation. Question early organizing fluid collection along the greater curvature of the gastric lumen (7:67, 4:26). Spleen: Normal in size without focal abnormality. Adrenals/Urinary Tract: No adrenal nodule bilaterally. Bilateral kidneys enhance symmetrically. Several similar in size in appearance fluid density lesions within the kidneys  likely represent simple renal cysts. Subcentimeter hypodensities are too small to characterize. No hydronephrosis. No hydroureter. The urinary bladder is distended with urine. Stomach/Bowel: Stomach is within normal limits. No evidence of bowel wall thickening or dilatation. Diffuse colonic diverticulosis. The appendix is not definitely identified. Vascular/Lymphatic: No splenic artery aneurysm formation. No abdominal aorta or iliac aneurysm. Severe atherosclerotic plaque of the aorta and its branches. No abdominal, pelvic, or inguinal lymphadenopathy. Reproductive: Prostate is unremarkable. Other: No intraperitoneal free fluid. No intraperitoneal free gas. No organized fluid collection. Musculoskeletal: No abdominal wall hernia or abnormality. Diffusely decreased bone density. No suspicious lytic or blastic osseous lesions. No acute displaced fracture. Similar-appearing multilevel degenerative changes of the spine with posterolateral fusion and laminectomy of the L2 through L5 levels. Partially visualized total right hip arthroplasty. Review of the MIP images confirms the above findings. IMPRESSION: 1. Acute on chronic pancreatitis. No definite pseudocyst formation. 2. No central or segmental pulmonary embolus. Unable to evaluate at the subsegmental level due to respiratory motion artifact. 3. Debris noted within the central airways as well as diffuse bronchial wall thickening. No new focal consolidation or finding of infection/inflammation within the lungs. 4. Diffuse colonic diverticulosis with no acute diverticulitis. 5. Two similar-appearing hepatic hemangioma. 6. Aortic Atherosclerosis (ICD10-I70.0) and Emphysema (ICD10-J43.9). 7. Status post cholecystectomy. Electronically Signed   By: Iven Finn M.D.   On: 08/12/2020 03:44     Time coordinating discharge: Over 30 minutes    [name], MD  [facility] 08/15/2020, 2:57 PM

## 2020-08-15 NOTE — Telephone Encounter (Signed)
-----   Message from Yetta Flock, MD sent at 08/14/2020  2:24 PM EDT ----- Regarding: outpatient follow up [facility],  This is one of Dr. Silverio Decamp outpatients. Needs an MRCP coordinated for 3-4 weeks from now and outpatient follow up with Dr. Silverio Decamp or APP in 3-4 weeks. Thanks.  Dr. Loni Muse

## 2020-08-18 LAB — IGG 4

## 2020-08-18 LAB — AFP TUMOR MARKER: AFP, Serum, Tumor Marker: 1.5 ng/mL (ref 0.0–6.4)

## 2020-08-19 ENCOUNTER — Other Ambulatory Visit: Payer: Self-pay

## 2020-08-19 NOTE — Telephone Encounter (Signed)
Okay to schedule follow-up visit with app in 4 to 6 weeks.  Thank you

## 2020-08-19 NOTE — Telephone Encounter (Signed)
MRCP at [facility] on 09/09/20 at 11:00 am, arrive at 10:30 am.  Follow up appointment 09/13/20 at 11:00 am, arrive at 10:45 am to see Tye Savoy, NP.  Called the patient. Cell phone goes from ringing to a fast busy signal. Called  the home number. No answer. Left a message to call back.  Left a message on the voicemail of the daughter.

## 2020-08-20 NOTE — Telephone Encounter (Signed)
Patient aware of the appointments. He has had an MRI before. He required anti-anxiety medication. Confirmed the [facility] [address] [facility]. Can we send something in for him. He understands he must have a driver.

## 2020-08-20 NOTE — Progress Notes (Signed)
Cardiology Office Note    Date:  08/21/2020   ID:  JUEL BELLEROSE, DOB 1934-10-21, MRN [id number]   PCP:  System, Provider Not In   [place]  Cardiologist:  Sherren Mocha, MD   Advanced Practice Provider:  No care team member to display Electrophysiologist:  None   [id number]   Chief Complaint  Patient presents with   Hospitalization Follow-up     History of Present Illness:  Wayne Meadows is a 86 y.o. male with history of CAD status post BMS to RCA and stent to the circumflex.  Cath in 2013 stents were patent and he had a 50% LAD and the first small diagonal that was occluded.  Repeat cardiac cath 07/09/2020 for chest pain stable three-vessel disease with widely patent stent in the ramus intermedius, moderate 40 to 50% in-stent restenosis in the proximal RCA BMS, focal 50% mid LAD just prior to the bend, occluded first diagonal.  Severe systemic hypertension with moderately elevated LVEDP.  Medical therapy recommended.  Patient discharged 08/15/2020 after hospitalization with acute on chronic pancreatitis responded well to bowel rest with fluids.  Patient comes in alone, went to the wrong address and walked a long way and became short of breath getting here. Not eating much and not getting regular exercise. No chest pain. DOE is chronic, legs weak.  Leg edema improves with compression hose.  Past Medical History:  Diagnosis Date   Arthritis    Carotid stenosis    COPD (chronic obstructive pulmonary disease) ([place])    Coronary artery disease    S/p PCI 2011;  NSTEMI 12/12:  LHC/PCI 02/23/11: LAD 60% after the septal perforator, D1 occluded with distal collaterals, proximal RI 30-40%, AV circumflex stent patent with 60% stenosis after the stent, RCA 99%, EF 60-65%.  His RCA was treated with a bare-metal stent   CVA (cerebral infarction) 2011   Right cerebral; total obstruction of the right ICA   Diverticulitis    Hypertension    Rectal bleeding  07/2015   Renal carcinoma (HCC)    Stroke ([place])    Tobacco abuse, in remission     Past Surgical History:  Procedure Laterality Date   COLON SURGERY     COLONOSCOPY N/A 08/22/2015   Procedure: COLONOSCOPY;  Surgeon: [name], MD;  Location: [facility] ENDOSCOPY;  Service: Endoscopy;  Laterality: N/A;   ESOPHAGOGASTRODUODENOSCOPY N/A 07/18/2020   Procedure: ESOPHAGOGASTRODUODENOSCOPY (EGD);  Surgeon: Milus Banister, MD;  Location: [facility];  Service: Endoscopy;  Laterality: N/A;   ESOPHAGOGASTRODUODENOSCOPY (EGD) WITH PROPOFOL N/A 06/21/2020   Procedure: ESOPHAGOGASTRODUODENOSCOPY (EGD) WITH PROPOFOL;  Surgeon: Irene Shipper, MD;  Location: [facility] ENDOSCOPY;  Service: Endoscopy;  Laterality: N/A;   EUS N/A 07/18/2020   Procedure: UPPER ENDOSCOPIC ULTRASOUND (EUS) RADIAL;  Surgeon: Milus Banister, MD;  Location: [facility] ENDOSCOPY;  Service: Endoscopy;  Laterality: N/A;   FINE NEEDLE ASPIRATION N/A 07/18/2020   Procedure: FINE NEEDLE ASPIRATION (FNA) LINEAR;  Surgeon: Milus Banister, MD;  Location: [facility] ENDOSCOPY;  Service: Endoscopy;  Laterality: N/A;   hip relacement     KIDNEY SURGERY     LEFT HEART CATH AND CORONARY ANGIOGRAPHY N/A 07/09/2020   Procedure: LEFT HEART CATH AND CORONARY ANGIOGRAPHY;  Surgeon: Leonie Man, MD;  Location: [place] CV LAB;  Service: Cardiovascular;  Laterality: N/A;   LEFT HEART CATHETERIZATION WITH CORONARY ANGIOGRAM N/A 02/23/2011   Procedure: LEFT HEART CATHETERIZATION WITH CORONARY ANGIOGRAM;  Surgeon: [name], MD;  Location: [facility] CATH LAB;  Service: Cardiovascular;  Laterality: N/A;   LEFT HEART CATHETERIZATION WITH CORONARY ANGIOGRAM N/A 03/18/2011   Procedure: LEFT HEART CATHETERIZATION WITH CORONARY ANGIOGRAM;  Surgeon: [name], MD;  Location: [facility] CATH LAB;  Service: Cardiovascular;  Laterality: N/A;   PERCUTANEOUS CORONARY STENT INTERVENTION (PCI-S) N/A 02/23/2011   Procedure: PERCUTANEOUS CORONARY STENT INTERVENTION (PCI-S);  Surgeon:  [name], MD;  Location: [facility] CATH LAB;  Service: Cardiovascular;  Laterality: N/A;   TEMPORARY PACEMAKER INSERTION N/A 02/23/2011   Procedure: TEMPORARY PACEMAKER INSERTION;  Surgeon: [name], MD;  Location: [facility] CATH LAB;  Service: Cardiovascular;  Laterality: N/A;    Current Medications: Current Meds  Medication Sig   albuterol (VENTOLIN HFA) 108 (90 Base) MCG/ACT inhaler Inhale 1 puff into the lungs every 6 (six) hours as needed for shortness of breath.   amLODipine (NORVASC) 10 MG tablet Take 1 tablet (10 mg total) by mouth daily.   aspirin EC 81 MG tablet Take 81 mg by mouth daily.   baclofen (LIORESAL) 10 MG tablet Take 10 mg by mouth at bedtime.   cloNIDine (CATAPRES) 0.1 MG tablet Take 1 tablet (0.1 mg total) by mouth daily.   clopidogrel (PLAVIX) 75 MG tablet Take 75 mg by mouth daily.   diclofenac Sodium (VOLTAREN) 1 % GEL Apply 2 g topically 4 (four) times daily.   enalapril (VASOTEC) 20 MG tablet Take 20 mg by mouth 2 (two) times daily. 1 whole tablet in the morning, and 1/2 tablet in the evenings   EPINEPHrine (EPI-PEN) 0.3 mg/0.3 mL DEVI Inject 0.3 mg into the muscle once as needed (for anaphylactic reaction).    ferrous sulfate 325 (65 FE) MG tablet Take 1 tablet by mouth. Every Monday, Wednesday, and Friday   gabapentin (NEURONTIN) 300 MG capsule Take 300-600 mg by mouth See admin instructions. Take 300 mg by mouth in the morning then 300 mg at noon then 600 mg at bedtime   HYDROcodone-acetaminophen (NORCO) 10-325 MG tablet Take 1 tablet by mouth 2 (two) times daily.   Ipratropium-Albuterol (COMBIVENT) 20-100 MCG/ACT AERS respimat Inhale 1 puff into the lungs 4 (four) times daily.   levothyroxine (SYNTHROID) 50 MCG tablet Take 50 mcg by mouth daily before breakfast.   Melatonin 3 MG TABS Take 6 mg by mouth at bedtime.   metoprolol tartrate (LOPRESSOR) 25 MG tablet Take 25 mg by mouth daily.   mometasone (ASMANEX) 220 MCG/INH inhaler Inhale 1 puff into the lungs at  bedtime.   Multiple Vitamin (MULTIVITAMIN) tablet Take 1 tablet by mouth daily.   nitroGLYCERIN (NITROSTAT) 0.4 MG SL tablet Place 0.4 mg under the tongue every 5 (five) minutes as needed for chest pain.   ondansetron (ZOFRAN) 4 MG tablet Take 1 tablet (4 mg total) by mouth every 6 (six) hours as needed for nausea.   pantoprazole (PROTONIX) 40 MG tablet Take 1 tablet (40 mg total) by mouth 2 (two) times daily.   polyethylene glycol (MIRALAX / GLYCOLAX) 17 g packet Take 17 g by mouth daily as needed for mild constipation or moderate constipation.   pravastatin (PRAVACHOL) 40 MG tablet Take 40 mg by mouth at bedtime.   vitamin B-12 (CYANOCOBALAMIN) 500 MCG tablet Take 2 tablets by mouth every Monday, Wednesday, and Friday.     Allergies:   Bee venom and Influenza vaccines   Social History   Socioeconomic History   Marital status: Divorced    Spouse name: Not on file   Number of children: Not  on file   Years of education: Not on file   Highest education level: Not on file  Occupational History   Not on file  Tobacco Use   Smoking status: Former    Pack years: 0.00    Types: Cigarettes    Quit date: 02/24/2007    Years since quitting: 13.4   Smokeless tobacco: Never  Substance and Sexual Activity   Alcohol use: Not Currently    Alcohol/week: 1.0 standard drink    Types: 1 Standard drinks or equivalent per week    Comment: socially    Drug use: No   Sexual activity: Not on file  Other Topics Concern   Not on file  Social History Narrative   Not on file   Social Determinants of Health   Financial Resource Strain: Not on file  Food Insecurity: Not on file  Transportation Needs: Not on file  Physical Activity: Not on file  Stress: Not on file  Social Connections: Not on file     Family History:  The patient's  family history includes Heart attack (age of onset: 31) in an other family member.   ROS:   Please see the history of present illness.    ROS All other systems  reviewed and are negative.   PHYSICAL EXAM:   VS:  BP (!) 118/52   Pulse 77   Ht 6\' 2"  (1.88 m)   Wt 194 lb (88 kg)   SpO2 95%   BMI 24.91 kg/m   Physical Exam  GEN: Well nourished, well developed, in no acute distress  Neck: no JVD, carotid bruits, or masses Cardiac:RRR; no murmurs, rubs, or gallops  Respiratory:  Decrease breath sounds throughout with some scattered wheezes GI: soft, nontender, nondistended, + BS Ext: plus 1-2 edema bilaterally, otherwise without cyanosis, clubbing, Good distal pulses bilaterally Neuro:  Alert and Oriented x 3, Psych: euthymic mood, full affect  Wt Readings from Last 3 Encounters:  08/21/20 194 lb (88 kg)  08/15/20 196 lb 6.9 oz (89.1 kg)  07/18/20 196 lb 3.4 oz (89 kg)      Studies/Labs Reviewed:   EKG:  EKG is not ordered today.   Recent Labs: 08/14/2020: ALT 11; BUN 13; Creatinine, Ser 1.58; Hemoglobin 9.5; Magnesium 1.8; Platelets 294; Potassium 3.7; Sodium 138   Lipid Panel    Component Value Date/Time   CHOL 65 08/12/2020 0400   TRIG 74 08/12/2020 0400   HDL 27 (L) 08/12/2020 0400   CHOLHDL 2.4 08/12/2020 0400   VLDL 15 08/12/2020 0400   LDLCALC 23 08/12/2020 0400    Additional studies/ records that were reviewed today include:   Left heart cath 07/09/20:   LV end diastolic pressure is moderately elevated. 17 mmHg ------------------------ RCA is a large-caliber, dominant vessel Prox RCA to Mid RCA bare-metal stent is 45% stenosed. --- RPDA lesion is 60% stenosed. Mid LAD lesion is 50% stenosed. Previously noted, mild diffuse disease otherwise noted. ----1st Diag lesion is 100% stenosed. Known Previously placed Ramus stent (unknown type) is widely patent. Remainder of the ramus intermedius is free of disease. LCx is a relatively small caliber, nondominant vessel with 1 small OM branch in the AV groove branch. Most of the LCx territory is covered by the Ramus Intermedius.   POST-OPERATIVE DIAGNOSIS:   Stable  three-vessel disease with widely patent stent in the proximal Ramus Intermedius, moderate 40 to 50% in-stent restenosis in the proximal RCA BMS, focal 50% mid LAD just prior to a bend (the previously  noted subtotaled first diagonal branch is now fully occluded). Severe Systemic Hypertension with moderately elevated LVEDP.  (LV gram not performed because of contrast conservation)     RECOMMENDATIONS           Consider evaluation for causes of nonanginal chest pain Patient will require additional blood pressure monitoring and IV fluid resuscitation after cardiac catheterization.  We will determine discharge plans based on primary team discussion.     _____________  Risk Assessment/Calculations:         ASSESSMENT:    1. Coronary artery disease due to lipid rich plaque   2. Chronic pancreatitis, unspecified pancreatitis type ([place])   3. Cerebrovascular disease   4. Primary hypertension   5. Other hyperlipidemia   6. Stage 3 chronic kidney disease, unspecified whether stage 3a or 3b CKD (HCC)      PLAN:  In order of problems listed above:  CAD status post BMS to the RCA and stent to the circumflex in the past.  Recent cardiac cath 07/09/2020 nonobstructive disease medical therapy recommended.  Please see above for details.  Patient without angina.  Chronic dyspnea on exertion most likely due to COPD.  No bleeding problems on Plavix and aspirin  Chronic pancreatitis discharge 08/15/2020 after acute episode  History of CVA on Plavix aspirin and statin  Hypertension blood pressure was elevated at time of cardiac cath.  Blood pressure well controlled today.  He has had multiple bouts of pancreatitis and is just getting back to normal.  Overall has lost 30 pounds.  Continue clonidine, enalapril, metoprolol  Hyperlipidemia on Pravachol, LDL 23 08/12/2020  CKD stage IIIb creatinine 1.58 08/14/2020  Leg edema managed with compression stockings. Doesn't take a diuretic.    Shared  Decision Making/Informed Consent        Medication Adjustments/Labs and Tests Ordered: Current medicines are reviewed at length with the patient today.  Concerns regarding medicines are outlined above.  Medication changes, Labs and Tests ordered today are listed in the Patient Instructions below. Patient Instructions  Medication Instructions:  Your physician recommends that you continue on your current medications as directed. Please refer to the Current Medication list given to you today.  *If you need a refill on your cardiac medications before your next appointment, please call your pharmacy*   Lab Work: None If you have labs (blood work) drawn today and your tests are completely normal, you will receive your results only by: [place] (if you have MyChart) OR A paper copy in the mail If you have any lab test that is abnormal or we need to change your treatment, we will call you to review the results.   Follow-Up: At [facility], you and your health needs are our priority.  As part of our continuing mission to provide you with exceptional heart care, we have created designated Provider Care Teams.  These Care Teams include your primary Cardiologist (physician) and Advanced Practice Providers (APPs -  Physician Assistants and Nurse Practitioners) who all work together to provide you with the care you need, when you need it.  We recommend signing up for the patient portal called "MyChart".  Sign up information is provided on this After Visit Summary.  MyChart is used to connect with patients for Virtual Visits (Telemedicine).  Patients are able to view lab/test results, encounter notes, upcoming appointments, etc.  Non-urgent messages can be sent to your provider as well.   To learn more about what you can do with MyChart, go to  [email or web address].    Your next appointment:   3-4 month(s)  The format for your next appointment:   In Person  Provider:   You may see  Sherren Mocha, MD or one of the following Advanced Practice Providers on your designated Care Team:   Richardson Dopp, PA-C [place] [address], PA-C   Signed, Wayne Meadows, [place]  08/21/2020 12:24 PM    [address], [place], [place]  [address] Phone: [phone]; Fax: [phone]

## 2020-08-21 ENCOUNTER — Ambulatory Visit (INDEPENDENT_AMBULATORY_CARE_PROVIDER_SITE_OTHER): Payer: Medicare Other | Admitting: Physician Assistant

## 2020-08-21 ENCOUNTER — Other Ambulatory Visit: Payer: Self-pay

## 2020-08-21 ENCOUNTER — Encounter: Payer: Self-pay | Admitting: Physician Assistant

## 2020-08-21 VITALS — BP 118/52 | HR 77 | Ht 74.0 in | Wt 194.0 lb

## 2020-08-21 DIAGNOSIS — I2583 Coronary atherosclerosis due to lipid rich plaque: Secondary | ICD-10-CM | POA: Diagnosis not present

## 2020-08-21 DIAGNOSIS — E7849 Other hyperlipidemia: Secondary | ICD-10-CM

## 2020-08-21 DIAGNOSIS — N183 Chronic kidney disease, stage 3 unspecified: Secondary | ICD-10-CM | POA: Diagnosis not present

## 2020-08-21 DIAGNOSIS — K861 Other chronic pancreatitis: Secondary | ICD-10-CM

## 2020-08-21 DIAGNOSIS — I2510 Atherosclerotic heart disease of native coronary artery without angina pectoris: Secondary | ICD-10-CM | POA: Diagnosis not present

## 2020-08-21 DIAGNOSIS — I679 Cerebrovascular disease, unspecified: Secondary | ICD-10-CM

## 2020-08-21 DIAGNOSIS — I10 Essential (primary) hypertension: Secondary | ICD-10-CM | POA: Diagnosis not present

## 2020-08-21 MED ORDER — LORAZEPAM 0.5 MG PO TABS: ORAL_TABLET | ORAL | 0 refills | Status: DC

## 2020-08-21 NOTE — Telephone Encounter (Signed)
Ativan 0.5 mg #2 0 [facility]

## 2020-08-21 NOTE — Patient Instructions (Signed)
Medication Instructions:  Your physician recommends that you continue on your current medications as directed. Please refer to the Current Medication list given to you today.  *If you need a refill on your cardiac medications before your next appointment, please call your pharmacy*   Lab Work: None If you have labs (blood work) drawn today and your tests are completely normal, you will receive your results only by: [place] (if you have MyChart) OR A paper copy in the mail If you have any lab test that is abnormal or we need to change your treatment, we will call you to review the results.   Follow-Up: At [facility], you and your health needs are our priority.  As part of our continuing mission to provide you with exceptional heart care, we have created designated Provider Care Teams.  These Care Teams include your primary Cardiologist (physician) and Advanced Practice Providers (APPs -  Physician Assistants and Nurse Practitioners) who all work together to provide you with the care you need, when you need it.  We recommend signing up for the patient portal called "MyChart".  Sign up information is provided on this After Visit Summary.  MyChart is used to connect with patients for Virtual Visits (Telemedicine).  Patients are able to view lab/test results, encounter notes, upcoming appointments, etc.  Non-urgent messages can be sent to your provider as well.   To learn more about what you can do with MyChart, go to [email or web address].    Your next appointment:   3-4 month(s)  The format for your next appointment:   In Person  Provider:   You may see Sherren Mocha, MD or one of the following Advanced Practice Providers on your designated Care Team:   Richardson Dopp, PA-C [place], [place]

## 2020-08-21 NOTE — Telephone Encounter (Signed)
Please send Rx for Ativan 0.5mg  X2. Thank you

## 2020-09-09 ENCOUNTER — Other Ambulatory Visit: Payer: Self-pay

## 2020-09-09 ENCOUNTER — Ambulatory Visit (HOSPITAL_COMMUNITY)
Admission: RE | Admit: 2020-09-09 | Discharge: 2020-09-09 | Disposition: A | Discharge status: Home / Self Care | Payer: Medicare Other | Source: Ambulatory Visit | Attending: Gastroenterology | Admitting: Gastroenterology

## 2020-09-09 ENCOUNTER — Ambulatory Visit (HOSPITAL_COMMUNITY): Admission: RE | Admit: 2020-09-09 | Payer: Medicare Other | Source: Ambulatory Visit

## 2020-09-09 DIAGNOSIS — K859 Acute pancreatitis without necrosis or infection, unspecified: Secondary | ICD-10-CM | POA: Diagnosis not present

## 2020-09-09 DIAGNOSIS — K8689 Other specified diseases of pancreas: Secondary | ICD-10-CM | POA: Diagnosis not present

## 2020-09-09 DIAGNOSIS — D1803 Hemangioma of intra-abdominal structures: Secondary | ICD-10-CM | POA: Diagnosis not present

## 2020-09-09 DIAGNOSIS — K8500 Idiopathic acute pancreatitis without necrosis or infection: Secondary | ICD-10-CM | POA: Diagnosis not present

## 2020-09-09 DIAGNOSIS — N281 Cyst of kidney, acquired: Secondary | ICD-10-CM | POA: Diagnosis not present

## 2020-09-09 MED ORDER — GADOBUTROL 1 MMOL/ML IV SOLN
10.0000 mL | Freq: Once | INTRAVENOUS | Status: AC | PRN
  Administered 2020-09-09: 10 mL via INTRAVENOUS

## 2020-09-13 ENCOUNTER — Ambulatory Visit (INDEPENDENT_AMBULATORY_CARE_PROVIDER_SITE_OTHER): Payer: Medicare Other | Admitting: Nurse Practitioner

## 2020-09-13 ENCOUNTER — Encounter: Payer: Self-pay | Admitting: Nurse Practitioner

## 2020-09-13 VITALS — BP 150/64 | HR 72 | Ht 74.0 in | Wt 197.0 lb

## 2020-09-13 DIAGNOSIS — I2510 Atherosclerotic heart disease of native coronary artery without angina pectoris: Secondary | ICD-10-CM

## 2020-09-13 DIAGNOSIS — I2583 Coronary atherosclerosis due to lipid rich plaque: Secondary | ICD-10-CM

## 2020-09-13 DIAGNOSIS — K8689 Other specified diseases of pancreas: Secondary | ICD-10-CM | POA: Diagnosis not present

## 2020-09-13 DIAGNOSIS — K859 Acute pancreatitis without necrosis or infection, unspecified: Secondary | ICD-10-CM

## 2020-09-13 NOTE — Progress Notes (Signed)
ASSESSMENT AND PLAN    # 86 year old male with multiple medical problems on chronic Plavix recently hospitalized for acute on chronic pancreatitis complicated by intramural gastric fluid collections.  He underwent EUS by Dr. Ardis Hughs in late May . Cytology showed acute inflammation and atypical cells, no malignancy.  Follow-up MRCP on 7/18 shows resolving acute pancreatitis but also shows 2 cm lesion in pancreatic body concerning for neoplasm.  -- I will discuss MRCP findings with patient's PCP, Dr. Silverio Decamp.  He may need repeat endoscopic ultrasound with FNA of pancreatic lesion   # CAD / distant PCI.  Recent left cardiac cath for unstable angina showed stable three-vessel disease with widely patent stent in the proximal Ramus Intermedius, moderate 40 to 50% in-stent restenosis in the proximal RCA BMS, focal 50% mid LAD just prior to a bend (the previously noted subtotaled first diagonal branch is now fully occluded). Severe Systemic Hypertension with moderately elevated LVEDP.  (LV gram not performed because of contrast conservation)   HISTORY OF PRESENT ILLNESS    Chief Complaint : Hospital follow-up  Wayne Meadows is a 86 y.o. male known to Dr. Silverio Decamp with a past medical history significant for COPD, CAD/PCI, CVA, chronic antiplatelet therapy , CKD, hypothyroidism , DM2 , colon perforation status post partial colectomy in 2013, diverticulosis, adenomatous colon polyps, segmental colitis in 2017. See PMH below for any additional medical problems.    Patient known to Dr. Silverio Decamp from 2017 when he was evaluated in the office for rectal bleeding.Marland Kitchen  He underwent colonoscopy with removal of some tubular adenomas and biopsies of sigmoid colon showing abnormal mucosa , sigmoid biopsies returned showing active chronic colitis versus segmental colitis associated with diverticulitis.  We recommended mesalamine but there was some problems with insurance coverage.  At the time of follow-up patient  was feeling better.  Our plan was to find an alternate medication that was covered by his insurance but it appears that he was lost to follow-up.   The next time we saw the patient was in April of this year in the hospital for evaluation of nausea, vomiting and abnormal on CT scan showing irregular gastric wall thickening about the cardia and greater curvature. There was significant adjacent inflammation in the left upper quadrant, but no free air or frank perforation. This inflammation abutted the tail of pancreas but appeared to be related to stomach rather than pancreas.  Inpatient EGD showed only a large caliber esophageal ring.  Given lack of findings on EGD and outpatient EUS was recommended.  EUS late May by Dr. Ardis Hughs showed a very abnormal proximal gastric wall and nearby tail of the pancreas with changes of chronic pancreatitis.  Milky fluid was aspirated from the gastric wall and sent for cytology and micro .  Patient was given a course of antibiotics  Cytology showed atypical cells and mixed acute and chronic inflammation. Fluid culture showed multiple organisms present, none predominant.   We saw the patient again for inpatient consultation 08/12/2020 after he was admitted for chest/upper abdominal pain.  CT scan showed acute on chronic pancreatitis with possible early organizing fluid collection along the greater curvature of the gastric lumen.  Patient responded to supportive measures and was discharged home within a few days. Plan was for outpatient MRCP in a few weeks. He had the MRCP on 09/10/20 which showed resolving pancreatitis, mild dilatation and beaded appearance of main PD c/w chronic pancreatitis. A focal mildly hyperenhancing masslike density is seen in the pancreatic  body at site of pancreatic duct stricture, measuring 2.0 x 1.9 cm . This is suspicious for a pancreatic mass   INTERVAL HISTORY: Here for hospital follow up.  He ate cashews a few days ago and had mild, transient LUQ  pain. Other than that he is eating and drinking okay and has not been having any abdominal pain.   PREVIOUS ENDOSCOPIC EVALUATIONS / PERTINENT STUDIES:   April 2022 noncontrast CT scan of the abdomen and pelvis IMPRESSION: 1. Irregular gastric wall thickening about the cardia and greater curvature, greater than 3.5 cm. Differential considerations include neoplasm, peptic ulcer disease or less likely focal gastritis. There is significant adjacent inflammation in the left upper quadrant, but no free air or frank perforation. This inflammation abuts the tail the pancreas, however appears to be related to stomach rather than pancreatic. Endoscopy recommended for further evaluation. 2. Small amount of free fluid or soft tissue density in the left pericolic gutter, possibly reactive but indeterminate. 3. Mildly dilated small bowel in the lower abdomen without discrete transition point, suspicious for ileus. 4. Extensive colonic diverticulosis without diverticulitis. 5. Bilateral renal cysts. There also 2 indeterminate lesions, 1 in the right and 1 in the left kidney. Left renal lesion has slightly increased in size from 2017, currently 16 mm. Recommend MRI characterization after resolution of acute event on an elective outpatient basis.  08/12/2020 CTAP with contrast IMPRESSION: 1. Acute on chronic pancreatitis. No definite pseudocyst formation. 2. No central or segmental pulmonary embolus. Unable to evaluate at the subsegmental level due to respiratory motion artifact. 3. Debris noted within the central airways as well as diffuse bronchial wall thickening. No new focal consolidation or finding of infection/inflammation within the lungs. 4. Diffuse colonic diverticulosis with no acute diverticulitis. 5. Two similar-appearing hepatic hemangioma. 6. Aortic Atherosclerosis (ICD10-I70.0) and Emphysema (ICD10-J43.9). 7. Status post cholecystectomy.  09/09/20 MRCP IMPRESSION: Decreased  inflammatory changes surrounding the pancreatic tail, consistent with improving acute pancreatitis. No evidence of pancreatic necrosis or pseudocyst.   2 cm masslike area in the pancreatic body at site of pancreatic duct stricture, highly suspicious for pancreatic neoplasm. Consider endoscopic ultrasound for further evaluation.   No evidence of abdominal metastatic disease.   Stable small benign hemangiomas in the right hepatic lobe.   Multiple benign Bosniak category 1 and 2 renal cysts.      Past Medical History:  Diagnosis Date   Arthritis    Carotid stenosis    COPD (chronic obstructive pulmonary disease) ([place])    Coronary artery disease    S/p PCI 2011;  NSTEMI 12/12:  LHC/PCI 02/23/11: LAD 60% after the septal perforator, D1 occluded with distal collaterals, proximal RI 30-40%, AV circumflex stent patent with 60% stenosis after the stent, RCA 99%, EF 60-65%.  His RCA was treated with a bare-metal stent   CVA (cerebral infarction) 2011   Right cerebral; total obstruction of the right ICA   Diverticulitis    Hypertension    Rectal bleeding 07/2015   Renal carcinoma (HCC)    Stroke (HCC)    Tobacco abuse, in remission     Current Medications, Allergies, Past Surgical History, Family History and Social History were reviewed in [employer] record.   Current Outpatient Medications  Medication Sig Dispense Refill   albuterol (VENTOLIN HFA) 108 (90 Base) MCG/ACT inhaler Inhale 1 puff into the lungs every 6 (six) hours as needed for shortness of breath.     amLODipine (NORVASC) 10 MG tablet Take 1 tablet (10 mg  total) by mouth daily. 90 tablet 1   aspirin EC 81 MG tablet Take 81 mg by mouth daily.     baclofen (LIORESAL) 10 MG tablet Take 10 mg by mouth at bedtime.     cloNIDine (CATAPRES) 0.1 MG tablet Take 1 tablet (0.1 mg total) by mouth daily. 30 tablet 0   clopidogrel (PLAVIX) 75 MG tablet Take 75 mg by mouth daily.     diclofenac Sodium (VOLTAREN)  1 % GEL Apply 2 g topically 4 (four) times daily. 150 g 0   enalapril (VASOTEC) 20 MG tablet Take 20 mg by mouth 2 (two) times daily. 1 whole tablet in the morning, and 1/2 tablet in the evenings     EPINEPHrine (EPI-PEN) 0.3 mg/0.3 mL DEVI Inject 0.3 mg into the muscle once as needed (for anaphylactic reaction).      ferrous sulfate 325 (65 FE) MG tablet Take 1 tablet by mouth. Every Monday, Wednesday, and Friday     gabapentin (NEURONTIN) 300 MG capsule Take 300-600 mg by mouth See admin instructions. Take 300 mg by mouth in the morning then 300 mg at noon then 600 mg at bedtime     HYDROcodone-acetaminophen (NORCO) 10-325 MG tablet Take 1 tablet by mouth 2 (two) times daily.     Ipratropium-Albuterol (COMBIVENT) 20-100 MCG/ACT AERS respimat Inhale 1 puff into the lungs 4 (four) times daily.     levothyroxine (SYNTHROID) 50 MCG tablet Take 50 mcg by mouth daily before breakfast.     LORazepam (ATIVAN) 0.5 MG tablet 1 PO 1 hour before MRI repeat after 30 minutes if needed 2 tablet 0   Melatonin 3 MG TABS Take 6 mg by mouth at bedtime.     metoprolol tartrate (LOPRESSOR) 25 MG tablet Take 25 mg by mouth daily.     mometasone (ASMANEX) 220 MCG/INH inhaler Inhale 1 puff into the lungs at bedtime.     Multiple Vitamin (MULTIVITAMIN) tablet Take 1 tablet by mouth daily.     nitroGLYCERIN (NITROSTAT) 0.4 MG SL tablet Place 0.4 mg under the tongue every 5 (five) minutes as needed for chest pain.     ondansetron (ZOFRAN) 4 MG tablet Take 1 tablet (4 mg total) by mouth every 6 (six) hours as needed for nausea. 20 tablet 0   pantoprazole (PROTONIX) 40 MG tablet Take 1 tablet (40 mg total) by mouth 2 (two) times daily. 60 tablet 0   polyethylene glycol (MIRALAX / GLYCOLAX) 17 g packet Take 17 g by mouth daily as needed for mild constipation or moderate constipation.     pravastatin (PRAVACHOL) 40 MG tablet Take 40 mg by mouth at bedtime.     vitamin B-12 (CYANOCOBALAMIN) 500 MCG tablet Take 2 tablets by  mouth every Monday, Wednesday, and Friday.     No current facility-administered medications for this visit.    Review of Systems: No chest pain. No shortness of breath. No urinary complaints.   PHYSICAL EXAM :    Wt Readings from Last 3 Encounters:  09/13/20 197 lb (89.4 kg)  08/21/20 194 lb (88 kg)  08/15/20 196 lb 6.9 oz (89.1 kg)    BP (!) 150/64   Pulse 72   Ht 6\' 2"  (1.88 m)   Wt 197 lb (89.4 kg)   BMI 25.29 kg/m  Constitutional:  Pleasant male in no acute distress. Psychiatric: Normal mood and affect. Behavior is normal. EENT: Pupils normal.  Conjunctivae are normal. No scleral icterus. Neck supple.  Cardiovascular: Normal rate, regular rhythm.  Pulmonary/chest: Effort normal and breath sounds normal. No wheezing, rales or rhonchi. Abdominal: Soft, nondistended, nontender. Bowel sounds active throughout. There are no masses palpable. No hepatomegaly. Neurological: Alert and oriented to person place and time. Skin: Skin is warm and dry. No rashes noted.  I spent 35 minutes total reviewing records (hospital consultation, progress notes, abdominal scans,  endoscopy reports),  obtaining history, performing exam, counseling patient and documenting visit / findings.    Tye Savoy, NP  09/13/2020, 11:26 AM

## 2020-09-13 NOTE — Patient Instructions (Signed)
If you are age 84 or older, your body mass index should be between 23-30. Your Body mass index is 25.29 kg/m. If this is out of the aforementioned range listed, please consider follow up with your Primary Care Provider.  We will call you to discuss further testing.  It was great seeing you today! Thank you for entrusting me with your care and choosing [facility].  Tye Savoy, NP

## 2020-10-21 NOTE — Progress Notes (Signed)
Reviewed and agree with documentation and assessment and plan. K. Veena Yoltzin Barg , MD   

## 2020-11-14 ENCOUNTER — Telehealth: Payer: Self-pay | Admitting: Nurse Practitioner

## 2020-11-14 NOTE — Telephone Encounter (Signed)
Patient returned the call, he is overall feeling well.  He has not had any severe abdominal pain since last hospitalization in June 2022. He had mild discomfort last week, thinks it is related to eating beans and heavy meal. He quit drinking alcohol completely 2 weeks ago.  Prior to that he would have a glass of wine or beer, denies excessive use of alcohol. Patient is upset about all the bills he has been getting from [facility] for the work-up he has had so far.  Advised him to call [facility] billing and also to contact [facility] so that they are appropriately processed.  I discussed briefly with Dr. Ardis Hughs regarding follow-up imaging with repeat MRI/MRCP or EUS to further delineate the 2 cm lesion in the distal body of pancreas.  I will forward the message to Dr. Ardis Hughs to review the images

## 2020-11-14 NOTE — Telephone Encounter (Signed)
Pt returned Dr. Woodward Ku call from yesterday. She is off this afternoon. Pls call pt again.

## 2020-11-15 ENCOUNTER — Other Ambulatory Visit: Payer: Self-pay

## 2020-11-15 ENCOUNTER — Telehealth: Payer: Self-pay | Admitting: *Deleted

## 2020-11-15 DIAGNOSIS — K8689 Other specified diseases of pancreas: Secondary | ICD-10-CM

## 2020-11-15 NOTE — Telephone Encounter (Signed)
EUS has been scheduled for 10/27 at 930 am at [facility] with DJ for pancreatic lesion.  Pt is on plavix

## 2020-11-15 NOTE — Telephone Encounter (Signed)
Dr. Burt Knack to review Plavix.  Patient has a history of BMS to RCA and stent to left circumflex.  Cardiac catheterization in 2013 showed patent stents, occluded first small diagonal, 50% LAD lesion.  Most recently, patient underwent repeat cardiac catheterization on 07/09/2020 for chest pain that showed moderate 40 to 50% in-stent restenosis in the proximal RCA BMS, focal 50% mid LAD lesion, occluded D1, medical therapy was recommended.  Since the last PCI was clearly over 1 year ago, would you be okay with him hold Plavix for 5 days prior to GI procedure?  Please forward your response to P CV DIV PREOP

## 2020-11-15 NOTE — Telephone Encounter (Signed)
Left message on machine to call back  

## 2020-11-15 NOTE — Telephone Encounter (Signed)
[facility] Pre-operative Risk Assessment     Request for surgical clearance:     Endoscopy Procedure  What type of surgery is being performed?     EUS  When is this surgery scheduled?     12/19/2020  What type of clearance is required ?   Pharmacy  Are there any medications that need to be held prior to surgery and how long? Plavix  Practice name and name of physician performing surgery?      [facility]  Dr Ardis Hughs   What is your office phone and fax number?      Phone- [phone]  [phone]  Anesthesia type (None, local, MAC, general) ?       MAC

## 2020-11-15 NOTE — Telephone Encounter (Addendum)
Called patient to discuss Dr. Ardis Hughs recommendation, did not reach him left a message on his cell phone to call back. Spoke to his daughter [name] and explained the reason for follow-up EUS exam,   She was provided an opportunity to ask questions and all were answered.  She agreed with the plan and demonstrated an understanding of the instructions.  She said her father will call back if he has any further questions  Shirlean Mylar, can you please obtain clearance from cardiology/prescribing MD to hold Plavix for 5 days prior to the procedure.   Patty, please review procedure time and instructions with patient.   Thank you

## 2020-11-15 NOTE — Telephone Encounter (Addendum)
I think we should repeat his upper EUS with me (my first available).  If you could please let him know, I will have Patty reach out to schedule.  Thanks   Patty, He needs upper endoscopic ultrasound with me, first available.  He will need to hold his Plavix 5 days prior and that will need to be authorized by the prescribing physician.  Thank you

## 2020-11-15 NOTE — Telephone Encounter (Signed)
Error appt time is 10 am due to [facility] clean up time

## 2020-11-17 NOTE — Telephone Encounter (Signed)
The patient is at acceptable risk of holding clopidogrel 5 days prior to the procedure as requested. Should resume when deemd safe after the procedure. Thank you.

## 2020-11-18 NOTE — Telephone Encounter (Signed)
EUS scheduled, pt instructed and medications reviewed.  Patient instructions mailed to home.  Patient to call with any questions or concerns.  I did advise the pt he can hold his plavix 5 days prior to the procedure.  I have also faxed the office note to the [place] at [phone] as pt requested.  Alternate fax number if needed is [phone] Shirlean Mylar you do not need to call the pt.

## 2020-11-18 NOTE — Telephone Encounter (Signed)
    Patient Name: Wayne Meadows  DOB: 1934/09/19 MRN: [id number]  Primary Cardiologist: Sherren Mocha, MD  Chart reviewed as part of pre-operative protocol coverage. I reached out to patient for update on how he is doing. The patient affirms he has been doing well without any new cardiac symptoms. Therefore, based on ACC/AHA guidelines, the patient would be at acceptable risk for the planned procedure without further cardiovascular testing. The patient was advised that if he develops new symptoms prior to surgery to contact our office to arrange for a follow-up visit, and he verbalized understanding.  Per Dr. Burt Knack, "The patient is at acceptable risk of holding clopidogrel 5 days prior to the procedure as requested. Should resume when deemd safe after the procedure." GI team is already aware of this and has notified patient.  I will route this recommendation to the requesting party via Epic fax function and remove from pre-op pool.  Please call with questions.  Charlie Pitter, PA-C 11/18/2020, 1:28 PM

## 2020-11-18 NOTE — Telephone Encounter (Signed)
Informed patient ok to hold plavix 5 days before procedure

## 2020-12-10 ENCOUNTER — Encounter (HOSPITAL_COMMUNITY): Payer: Self-pay | Admitting: Gastroenterology

## 2020-12-10 NOTE — Progress Notes (Signed)
Attempted to obtain medical history via telephone, unable to reach at this time. I left a voicemail to return pre surgical testing department's phone call.  

## 2020-12-19 ENCOUNTER — Telehealth: Payer: Self-pay | Admitting: Gastroenterology

## 2020-12-19 ENCOUNTER — Encounter (HOSPITAL_COMMUNITY): Admission: RE | Payer: Self-pay | Source: Ambulatory Visit

## 2020-12-19 ENCOUNTER — Ambulatory Visit (HOSPITAL_COMMUNITY)
Admission: RE | Admit: 2020-12-19 | Payer: No Typology Code available for payment source | Source: Ambulatory Visit | Admitting: Gastroenterology

## 2020-12-19 SURGERY — UPPER ENDOSCOPIC ULTRASOUND (EUS) RADIAL
Anesthesia: Monitor Anesthesia Care

## 2020-12-19 NOTE — Telephone Encounter (Signed)
He did not show for his repeat EUS today.  Looking through Epic it seems he's getting this done somewhere else instead ([facility] [place]?) in next few weeks.  I called all 3 phone numbers that we have a couple times today, no answer at any of them.  Please let me know if he want any further help here.  Thanks

## 2020-12-19 NOTE — Telephone Encounter (Signed)
Pt returned call. States he needed to cancel the procedure d/t the [facility] not covering [facility] provider services. He states he made Barrie Dunker (unknown employee for this practice) aware that he could not afford the procedure and would need to ensure the procedure was canceled given [facility] refusal to cover services. Thanked pt for clarifying this miscommunication and advised we will ensure this is forwarded to the appropriate parties. Pt apologized, stating "I that Santiago Glad took care of it." Advised no apology was necessary and again expressed appreciation for his clarification. Routing to Dr.'s Ardis Hughs and [place] as Juluis Rainier. Will also forward to Practice Admin for further f/u.  Additionally, notes of referral (procedure auth) indicate:  Referral Notes    Date Time Type Summary User  12/18/2020 9:08 AM EDT Insurance Verification lmom for [employer] nurse at [facility] to call me back regarding if pt needs a referral/auth for this procedure [phone] [facility] I  Note Text:  lmom for [facility] nurse at [facility] to call me back regarding if pt needs a referral/auth for this procedure [phone]

## 2020-12-19 NOTE — Telephone Encounter (Signed)
I am sorry Linna Hoff that he no showed.  Beth, can you please check what happened and why patient didn't inform us. Thanks

## 2020-12-19 NOTE — Telephone Encounter (Signed)
Per Dr. Woodward Ku request, called pt and LVM requesting returned call.

## 2020-12-20 NOTE — Telephone Encounter (Signed)
Thank you Ammie for following up.

## 2020-12-24 DIAGNOSIS — K861 Other chronic pancreatitis: Secondary | ICD-10-CM | POA: Insufficient documentation

## 2020-12-24 NOTE — Progress Notes (Deleted)
Cardiology Office Note:    Date:  12/24/2020   ID:  Wayne Meadows, DOB Jul 12, 1934, MRN [id number]  PCP:  System, Provider Not In   [facility] Prov[facility] Cardiologist:  Sherren Mocha, MD { Click to update primary MD,subspecialty MD or APP then REFRESH:1}  *** Referring MD: No ref. provider found   Chief Complaint:  No chief complaint on file. {Click here for Visit Info    :1}   Patient Profile:   Wayne Meadows is a 86 y.o. male with:  Coronary artery disease  S/p prior stent to AV LCx NSTEMI in 2021 s/p BMS to RCA  Cath in 5/22: LCx and RCA stents patent  Hypertension  Hyperlipidemia  Renal Cell CA Chronic kidney disease  COPD Carotid artery disease Hx of CVA in 2011 On ASA and Plavix  Chronic pancreatitis  Admx in 6/22 Leg edema  Hypothyroidism   History of Present Illness: Wayne Meadows was last seen in clinic by Ermalinda Barrios, PA-C in 6/22.  He returns for f/u.  ***      ASSESSMENT & PLAN:   No problem-specific Assessment & Plan notes found for this encounter.        {Are you ordering a CV Procedure (e.g. stress test, cath, DCCV, TEE, etc)?   Press F2        :[id number]}   Dispo:  No follow-ups on file.    Prior CV studies: Cardiac catheterization 07-17-20 RCA prox stent patent with 38 ISR; RPDA 60 LAD mid 28; D1 100 CTO  RI stent patent LCx patent  LVEDP 15  {Select studies to display:26339}    Past Medical History:  Diagnosis Date   Arthritis    Carotid stenosis    COPD (chronic obstructive pulmonary disease) ([place])    Coronary artery disease    S/p PCI 2011;  NSTEMI 12/12:  LHC/PCI 02/23/11: LAD 60% after the septal perforator, D1 occluded with distal collaterals, proximal RI 30-40%, AV circumflex stent patent with 60% stenosis after the stent, RCA 99%, EF 60-65%.  His RCA was treated with a bare-metal stent   CVA (cerebral infarction) 2011   Right cerebral; total obstruction of the right ICA   Diverticulitis    Hypertension    Rectal  bleeding 07/2015   Renal carcinoma (HCC)    Stroke ([place])    Tobacco abuse, in remission    Current Medications: No outpatient medications have been marked as taking for the 12/25/20 encounter (Appointment) with Richardson Dopp T, PA-C.    Allergies:   Bee venom and Influenza vaccines   Social History   Tobacco Use   Smoking status: Former    Types: Cigarettes    Quit date: 02/24/2007    Years since quitting: 13.8   Smokeless tobacco: Never  Substance Use Topics   Alcohol use: Not Currently    Alcohol/week: 1.0 standard drink    Types: 1 Standard drinks or equivalent per week    Comment: socially    Drug use: No    Family Hx: The patient's family history includes Heart attack (age of onset: 77) in an other family member.  ROS   EKGs/Labs/Other Test Reviewed:    EKG:  EKG is *** ordered today.  The ekg ordered today demonstrates ***  Recent Labs: 08/14/2020: ALT 11; BUN 13; Creatinine, Ser 1.58; Hemoglobin 9.5; Magnesium 1.8; Platelets 294; Potassium 3.7; Sodium 138   Recent Lipid Panel Lab Results  Component Value Date/Time   CHOL 65 08/12/2020 04:00 AM  TRIG 74 08/12/2020 04:00 AM   HDL 27 (L) 08/12/2020 04:00 AM   LDLCALC 23 08/12/2020 04:00 AM     Risk Assessment/Calculations:   {Does this patient have ATRIAL FIBRILLATION?[phone]}      Physical Exam:    VS:  There were no vitals taken for this visit.    Wt Readings from Last 3 Encounters:  09/13/20 197 lb (89.4 kg)  08/21/20 194 lb (88 kg)  08/15/20 196 lb 6.9 oz (89.1 kg)    Physical Exam ***    Medication Adjustments/Labs and Tests Ordered: Current medicines are reviewed at length with the patient today.  Concerns regarding medicines are outlined above.  Tests Ordered: No orders of the defined types were placed in this encounter.  Medication Changes: No orders of the defined types were placed in this encounter.  Signed, Richardson Dopp, PA-C  12/24/2020 10:51 PM    [facility] [place], [place],   [address] Phone: [phone]; Fax: [phone]

## 2020-12-25 ENCOUNTER — Ambulatory Visit: Payer: Medicare Other | Admitting: Physician Assistant

## 2020-12-25 DIAGNOSIS — N183 Chronic kidney disease, stage 3 unspecified: Secondary | ICD-10-CM

## 2020-12-25 DIAGNOSIS — I10 Essential (primary) hypertension: Secondary | ICD-10-CM

## 2020-12-25 DIAGNOSIS — E7849 Other hyperlipidemia: Secondary | ICD-10-CM

## 2020-12-25 DIAGNOSIS — I2510 Atherosclerotic heart disease of native coronary artery without angina pectoris: Secondary | ICD-10-CM

## 2020-12-25 DIAGNOSIS — K861 Other chronic pancreatitis: Secondary | ICD-10-CM

## 2021-01-01 ENCOUNTER — Emergency Department (HOSPITAL_COMMUNITY): Payer: No Typology Code available for payment source

## 2021-01-01 ENCOUNTER — Emergency Department (HOSPITAL_COMMUNITY)
Admission: EM | Admit: 2021-01-01 | Discharge: 2021-01-02 | Disposition: A | Discharge status: Home / Self Care | Payer: No Typology Code available for payment source | Attending: Emergency Medicine | Admitting: Emergency Medicine

## 2021-01-01 ENCOUNTER — Other Ambulatory Visit: Payer: Self-pay

## 2021-01-01 ENCOUNTER — Encounter (HOSPITAL_COMMUNITY): Payer: Self-pay

## 2021-01-01 DIAGNOSIS — Z85528 Personal history of other malignant neoplasm of kidney: Secondary | ICD-10-CM | POA: Diagnosis not present

## 2021-01-01 DIAGNOSIS — J9811 Atelectasis: Secondary | ICD-10-CM | POA: Diagnosis not present

## 2021-01-01 DIAGNOSIS — Z7951 Long term (current) use of inhaled steroids: Secondary | ICD-10-CM | POA: Insufficient documentation

## 2021-01-01 DIAGNOSIS — Z87891 Personal history of nicotine dependence: Secondary | ICD-10-CM | POA: Diagnosis not present

## 2021-01-01 DIAGNOSIS — Z7982 Long term (current) use of aspirin: Secondary | ICD-10-CM | POA: Diagnosis not present

## 2021-01-01 DIAGNOSIS — R030 Elevated blood-pressure reading, without diagnosis of hypertension: Secondary | ICD-10-CM

## 2021-01-01 DIAGNOSIS — J449 Chronic obstructive pulmonary disease, unspecified: Secondary | ICD-10-CM | POA: Insufficient documentation

## 2021-01-01 DIAGNOSIS — N183 Chronic kidney disease, stage 3 unspecified: Secondary | ICD-10-CM | POA: Insufficient documentation

## 2021-01-01 DIAGNOSIS — Z7902 Long term (current) use of antithrombotics/antiplatelets: Secondary | ICD-10-CM | POA: Diagnosis not present

## 2021-01-01 DIAGNOSIS — I129 Hypertensive chronic kidney disease with stage 1 through stage 4 chronic kidney disease, or unspecified chronic kidney disease: Secondary | ICD-10-CM | POA: Insufficient documentation

## 2021-01-01 DIAGNOSIS — L03116 Cellulitis of left lower limb: Secondary | ICD-10-CM | POA: Insufficient documentation

## 2021-01-01 DIAGNOSIS — L0390 Cellulitis, unspecified: Secondary | ICD-10-CM

## 2021-01-01 DIAGNOSIS — Z79899 Other long term (current) drug therapy: Secondary | ICD-10-CM | POA: Insufficient documentation

## 2021-01-01 DIAGNOSIS — R600 Localized edema: Secondary | ICD-10-CM | POA: Diagnosis not present

## 2021-01-01 DIAGNOSIS — M7989 Other specified soft tissue disorders: Secondary | ICD-10-CM | POA: Diagnosis not present

## 2021-01-01 DIAGNOSIS — J90 Pleural effusion, not elsewhere classified: Secondary | ICD-10-CM | POA: Diagnosis not present

## 2021-01-01 DIAGNOSIS — I872 Venous insufficiency (chronic) (peripheral): Secondary | ICD-10-CM | POA: Diagnosis not present

## 2021-01-01 DIAGNOSIS — R0602 Shortness of breath: Secondary | ICD-10-CM | POA: Diagnosis not present

## 2021-01-01 LAB — D-DIMER, QUANTITATIVE: D-Dimer, Quant: 2 ug/mL-FEU — ABNORMAL HIGH (ref 0.00–0.50)

## 2021-01-01 LAB — BASIC METABOLIC PANEL
Anion gap: 10 (ref 5–15)
BUN: 22 mg/dL (ref 8–23)
CO2: 27 mmol/L (ref 22–32)
Calcium: 8.9 mg/dL (ref 8.9–10.3)
Chloride: 105 mmol/L (ref 98–111)
Creatinine, Ser: 1.98 mg/dL — ABNORMAL HIGH (ref 0.61–1.24)
GFR, Estimated: 33 mL/min — ABNORMAL LOW (ref >60)
Glucose, Bld: 133 mg/dL — ABNORMAL HIGH (ref 70–99)
Potassium: 3.9 mmol/L (ref 3.5–5.1)
Sodium: 142 mmol/L (ref 135–145)

## 2021-01-01 LAB — CBC
HCT: 36.5 % — ABNORMAL LOW (ref 39.0–52.0)
Hemoglobin: 11.4 g/dL — ABNORMAL LOW (ref 13.0–17.0)
MCH: 29 pg (ref 26.0–34.0)
MCHC: 31.2 g/dL (ref 30.0–36.0)
MCV: 92.9 fL (ref 80.0–100.0)
Platelets: 328 10*3/uL (ref 150–400)
RBC: 3.93 MIL/uL — ABNORMAL LOW (ref 4.22–5.81)
RDW: 14.5 % (ref 11.5–15.5)
WBC: 11.8 10*3/uL — ABNORMAL HIGH (ref 4.0–10.5)
nRBC: 0 % (ref 0.0–0.2)

## 2021-01-01 LAB — TROPONIN I (HIGH SENSITIVITY): Troponin I (High Sensitivity): 4 ng/L (ref <18)

## 2021-01-01 LAB — BRAIN NATRIURETIC PEPTIDE: B Natriuretic Peptide: 308.5 pg/mL — ABNORMAL HIGH (ref 0.0–100.0)

## 2021-01-01 MED ORDER — CEPHALEXIN 500 MG PO CAPS: 500.0000 mg | ORAL_CAPSULE | Freq: Two times a day (BID) | ORAL | 0 refills | Status: AC

## 2021-01-01 NOTE — ED Notes (Addendum)
VASCULAR TECH [place] left at 7pm. Gray,MD made aware.

## 2021-01-01 NOTE — ED Provider Notes (Signed)
[facility] DEPT Provider Note   CSN: [id number] Arrival date & time: 01/01/21  1517     History Chief Complaint  Patient presents with   Leg Pain    Wayne Meadows is a 86 y.o. male.  This is a 86 y.o. male with significant medical history as below, including copd, htn, cva who presents to the ED with complaint of leg swelling, redness. Sent by [facility] for evaluation   Location:  right LE Duration:  2 mos Onset:  gradual Timing:  constant Description:  aching to right leg, mild swelling, peeling/oozing of clear liquid  Severity:  mild Exacerbating/Alleviating Factors:  not identified Associated Symptoms:  no fevers or chills, no hx dvt or pe, no trauma, he is ambulatory without much difficulty, feels ambulation is baseline currently.    The history is provided by the patient. No language interpreter was used.  Leg Pain Associated symptoms: no fever       Past Medical History:  Diagnosis Date   Arthritis    Carotid stenosis    COPD (chronic obstructive pulmonary disease) ([place])    Coronary artery disease    S/p PCI 2011;  NSTEMI 12/12:  LHC/PCI 02/23/11: LAD 60% after the septal perforator, D1 occluded with distal collaterals, proximal RI 30-40%, AV circumflex stent patent with 60% stenosis after the stent, RCA 99%, EF 60-65%.  His RCA was treated with a bare-metal stent   CVA (cerebral infarction) 2011   Right cerebral; total obstruction of the right ICA   Diverticulitis    Hypertension    Rectal bleeding 07/2015   Renal carcinoma (HCC)    Stroke ([place])    Tobacco abuse, in remission     Patient Active Problem List   Diagnosis Date Noted   Chronic pancreatitis ([place]) 12/24/2020   Abnormal finding on GI tract imaging    Acute pancreatitis 08/12/2020   Nausea and vomiting in adult 06/22/2020   Gastric lesion    Esophageal stricture    Refractory nausea and vomiting 06/20/2020   Sepsis, unspecified organism ([place]) 04/11/2016   COPD  exacerbation ([place]) 04/11/2016   Stage 3 chronic kidney disease ([place]) 04/11/2016   Hyperbilirubinemia 04/11/2016   Elevated lactic acid level    Renal insufficiency    Segmental colitis ([place]) 09/04/2015   Generalized abdominal pain 09/04/2015   Noninfectious gastroenteritis, unspecified    Benign neoplasm of transverse colon    Benign neoplasm of colon    Bright red blood per rectum    Acute blood loss anemia    Dysphagia    Renal cell cancer ([place])    Essential hypertension    Centrilobular emphysema (HCC)    Blood in stool 08/19/2015   Rectal bleeding 08/19/2015   Chest pain 04/08/2014   CAP (community acquired pneumonia) 04/08/2014   Community acquired pneumonia of left lower lobe of lung 04/08/2014   Overweight (BMI 25.0-29.9) 04/08/2014   Healthcare-associated pneumonia 10/26/2011   Cholelithiasis 04/26/2011   Atrial fibrillation ([place]) 04/26/2011   Chest pain 03/19/2011   Hypertension 03/19/2011   Unstable angina ([place]) 03/18/2011    Class: Acute   Lung nodule 03/02/2011   Liver lesion 03/02/2011   Other hyperlipidemia 03/02/2011   Abnormal CT of the abdomen 02/23/2011   Non Q wave myocardial infarction ([place]) 02/22/2011    Class: Acute   Cerebrovascular disease 02/22/2011   Coronary artery disease due to lipid rich plaque 02/22/2011   COPD (chronic obstructive pulmonary disease) ([place]) 02/22/2011   Tobacco abuse,  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/22/2011    Past Surgical History:  Procedure Laterality Date   COLON SURGERY     COLONOSCOPY N/A 08/22/2015   Procedure: COLONOSCOPY;  Surgeon: [name], MD;  Location: [facility];  Service: Endoscopy;  Laterality: N/A;   ESOPHAGOGASTRODUODENOSCOPY N/A 07/18/2020   Procedure: ESOPHAGOGASTRODUODENOSCOPY (EGD);  Surgeon: Milus Banister, MD;  Location: [facility];  Service: Endoscopy;  Laterality: N/A;   ESOPHAGOGASTRODUODENOSCOPY (EGD) WITH PROPOFOL N/A 06/21/2020   Procedure: ESOPHAGOGASTRODUODENOSCOPY (EGD) WITH PROPOFOL;   Surgeon: Irene Shipper, MD;  Location: [facility] ENDOSCOPY;  Service: Endoscopy;  Laterality: N/A;   EUS N/A 07/18/2020   Procedure: UPPER ENDOSCOPIC ULTRASOUND (EUS) RADIAL;  Surgeon: Milus Banister, MD;  Location: [facility] ENDOSCOPY;  Service: Endoscopy;  Laterality: N/A;   FINE NEEDLE ASPIRATION N/A 07/18/2020   Procedure: FINE NEEDLE ASPIRATION (FNA) LINEAR;  Surgeon: Milus Banister, MD;  Location: [facility] ENDOSCOPY;  Service: Endoscopy;  Laterality: N/A;   hip relacement     KIDNEY SURGERY     LEFT HEART CATH AND CORONARY ANGIOGRAPHY N/A 07/09/2020   Procedure: LEFT HEART CATH AND CORONARY ANGIOGRAPHY;  Surgeon: Leonie Man, MD;  Location: [facility];  Service: Cardiovascular;  Laterality: N/A;   LEFT HEART CATHETERIZATION WITH CORONARY ANGIOGRAM N/A 02/23/2011   Procedure: LEFT HEART CATHETERIZATION WITH CORONARY ANGIOGRAM;  Surgeon: [name], MD;  Location: [facility] CATH LAB;  Service: Cardiovascular;  Laterality: N/A;   LEFT HEART CATHETERIZATION WITH CORONARY ANGIOGRAM N/A 03/18/2011   Procedure: LEFT HEART CATHETERIZATION WITH CORONARY ANGIOGRAM;  Surgeon: [name], MD;  Location: [facility] CATH LAB;  Service: Cardiovascular;  Laterality: N/A;   PERCUTANEOUS CORONARY STENT INTERVENTION (PCI-S) N/A 02/23/2011   Procedure: PERCUTANEOUS CORONARY STENT INTERVENTION (PCI-S);  Surgeon: [name], MD;  Location: [facility] CATH LAB;  Service: Cardiovascular;  Laterality: N/A;   TEMPORARY PACEMAKER INSERTION N/A 02/23/2011   Procedure: TEMPORARY PACEMAKER INSERTION;  Surgeon: [name], MD;  Location: [facility] CATH LAB;  Service: Cardiovascular;  Laterality: N/A;       Family History  Problem Relation Age of Onset   Heart attack Other 19    Social History   Tobacco Use   Smoking status: Former    Types: Cigarettes    Quit date: 02/24/2007    Years since quitting: 13.8   Smokeless tobacco: Never  Substance Use Topics   Alcohol use: Not Currently    Alcohol/week: 1.0 standard drink     Types: 1 Standard drinks or equivalent per week    Comment: socially    Drug use: No    Home Medications Prior to Admission medications   Medication Sig Start Date End Date Taking? Authorizing Provider  cephALEXin (KEFLEX) 500 MG capsule Take 1 capsule (500 mg total) by mouth 2 (two) times daily for 7 days. 01/01/21 01/08/21 Yes Wynona Dove A, DO  albuterol (VENTOLIN HFA) 108 (90 Base) MCG/ACT inhaler Inhale 1 puff into the lungs every 6 (six) hours as needed for shortness of breath.    [provider]  amLODipine (NORVASC) 10 MG tablet Take 1 tablet (10 mg total) by mouth daily. 07/11/20   Margie Billet, NP  aspirin EC 81 MG tablet Take 81 mg by mouth daily.    [provider]  baclofen (LIORESAL) 10 MG tablet Take 10 mg by mouth at bedtime.    [provider]  cloNIDine (CATAPRES) 0.1 MG tablet Take 1 tablet (0.1 mg total) by mouth daily. 06/24/20   Aslam,  Loralyn Freshwater, MD  clopidogrel (PLAVIX) 75 MG tablet Take 75 mg by mouth daily.    [provider]  diclofenac Sodium (VOLTAREN) 1 % GEL Apply 2 g topically 4 (four) times daily. 06/23/20   Harvie Heck, MD  enalapril (VASOTEC) 20 MG tablet Take 20 mg by mouth 2 (two) times daily. 1 whole tablet in the morning, and 1/2 tablet in the evenings    [provider]  EPINEPHrine (EPI-PEN) 0.3 mg/0.3 mL DEVI Inject 0.3 mg into the muscle once as needed (for anaphylactic reaction).     [provider]  ferrous sulfate 325 (65 FE) MG tablet Take 1 tablet by mouth. Every Monday, Wednesday, and Friday 07/30/20   [provider]  gabapentin (NEURONTIN) 300 MG capsule Take 300-600 mg by mouth See admin instructions. Take 300 mg by mouth in the morning then 300 mg at noon then 600 mg at bedtime    [provider]  HYDROcodone-acetaminophen (NORCO) 10-325 MG tablet Take 1 tablet by mouth 2 (two) times daily.    [provider]  Ipratropium-Albuterol (COMBIVENT) 20-100 MCG/ACT AERS respimat  Inhale 1 puff into the lungs 4 (four) times daily.    [provider]  levothyroxine (SYNTHROID) 50 MCG tablet Take 50 mcg by mouth daily before breakfast.    [provider]  LORazepam (ATIVAN) 0.5 MG tablet 1 PO 1 hour before MRI repeat after 30 minutes if needed 08/21/20   [name], MD  Melatonin 3 MG TABS Take 6 mg by mouth at bedtime.    [provider]  metoprolol tartrate (LOPRESSOR) 25 MG tablet Take 25 mg by mouth daily.    [provider]  mometasone (ASMANEX) 220 MCG/INH inhaler Inhale 1 puff into the lungs at bedtime.    [provider]  Multiple Vitamin (MULTIVITAMIN) tablet Take 1 tablet by mouth daily.    [provider]  nitroGLYCERIN (NITROSTAT) 0.4 MG SL tablet Place 0.4 mg under the tongue every 5 (five) minutes as needed for chest pain.    [provider]  ondansetron (ZOFRAN) 4 MG tablet Take 1 tablet (4 mg total) by mouth every 6 (six) hours as needed for nausea. 06/23/20   Harvie Heck, MD  pantoprazole (PROTONIX) 40 MG tablet Take 1 tablet (40 mg total) by mouth 2 (two) times daily. 06/23/20   Harvie Heck, MD  polyethylene glycol (MIRALAX / GLYCOLAX) 17 g packet Take 17 g by mouth daily as needed for mild constipation or moderate constipation. 08/15/20   [name], MD  pravastatin (PRAVACHOL) 40 MG tablet Take 40 mg by mouth at bedtime.    [provider]  vitamin B-12 (CYANOCOBALAMIN) 500 MCG tablet Take 2 tablets by mouth every Monday, Wednesday, and Friday. 07/29/20   [provider]    Allergies    Bee venom and Influenza vaccines  Review of Systems   Review of Systems  Constitutional:  Negative for chills and fever.  HENT:  Negative for facial swelling and trouble swallowing.   Eyes:  Negative for photophobia and visual disturbance.  Respiratory:  Negative for cough and shortness of breath.   Cardiovascular:  Positive for leg swelling. Negative for chest pain and  palpitations.  Gastrointestinal:  Negative for abdominal pain, nausea and vomiting.  Endocrine: Negative for polydipsia and polyuria.  Genitourinary:  Negative for difficulty urinating and hematuria.  Musculoskeletal:  Negative for gait problem and joint swelling.  Skin:  Positive for rash. Negative for pallor.  Neurological:  Negative  for syncope and headaches.  Psychiatric/Behavioral:  Negative for agitation and confusion.    Physical Exam Updated Vital Signs BP (!) 172/78   Pulse 70   Temp 97.8 F (36.6 C) (Oral)   Resp 15   Ht 6\' 2"  (1.88 m)   Wt 93 kg   SpO2 90%   BMI 26.32 kg/m   Physical Exam Vitals and nursing note reviewed.  Constitutional:      General: He is not in acute distress.    Appearance: He is well-developed.  HENT:     Head: Normocephalic and atraumatic.     Right Ear: External ear normal.     Left Ear: External ear normal.     Mouth/Throat:     Mouth: Mucous membranes are moist.  Eyes:     General: No scleral icterus. Cardiovascular:     Rate and Rhythm: Normal rate and regular rhythm.     Pulses: Normal pulses.     Heart sounds: Normal heart sounds.  Pulmonary:     Effort: Pulmonary effort is normal. No respiratory distress.     Breath sounds: Normal breath sounds.  Abdominal:     General: Abdomen is flat.     Palpations: Abdomen is soft.     Tenderness: There is no abdominal tenderness.  Musculoskeletal:        General: Normal range of motion.     Cervical back: Normal range of motion.     Right lower leg: No edema.     Left lower leg: Edema present.  Skin:    General: Skin is warm and dry.     Capillary Refill: Capillary refill takes less than 2 seconds.     Comments: B/l le stasis dermitis, question of small area of cellulitis to RLE. 2+ dp pulses b/l  Neurological:     Mental Status: He is alert and oriented to person, place, and time.  Psychiatric:        Mood and Affect: Mood normal.        Behavior: Behavior normal.    ED  Results / Procedures / Treatments   Labs (all labs ordered are listed, but only abnormal results are displayed) Labs Reviewed  BRAIN NATRIURETIC PEPTIDE - Abnormal; Notable for the following components:      Result Value   B Natriuretic Peptide 308.5 (*)    All other components within normal limits  BASIC METABOLIC PANEL - Abnormal; Notable for the following components:   Glucose, Bld 133 (*)    Creatinine, Ser 1.98 (*)    GFR, Estimated 33 (*)    All other components within normal limits  CBC - Abnormal; Notable for the following components:   WBC 11.8 (*)    RBC 3.93 (*)    Hemoglobin 11.4 (*)    HCT 36.5 (*)    All other components within normal limits  D-DIMER, QUANTITATIVE - Abnormal; Notable for the following components:   D-Dimer, Quant 2.00 (*)    All other components within normal limits  TROPONIN I (HIGH SENSITIVITY)  TROPONIN I (HIGH SENSITIVITY)    EKG EKG Interpretation  Date/Time:  Wednesday January 01 2021 20:34:00 EST Ventricular Rate:  77 PR Interval:  70 QRS Duration: 101 QT Interval:  408 QTC Calculation: 462 R Axis:   94 Text Interpretation: Sinus rhythm Interpretation limited secondary to artifact No stemi Confirmed by Wynona Dove ([id number]) on 01/01/2021 11:51:26 PM  Radiology DG Chest 2 View  Result Date: 01/01/2021 CLINICAL DATA:  Bilateral leg  swelling.  Shortness of breath. EXAM: CHEST - 2 VIEW COMPARISON:  Chest x-ray 07/08/2020, CT chest 08/12/2020 FINDINGS: The heart and mediastinal contours are unchanged. Aortic calcification. Right base atelectasis. No focal consolidation. No pulmonary edema. No right pleural effusion. Interval development of a trace left pleural effusion. No pneumothorax. No acute osseous abnormality. IMPRESSION: 1. Interval development of a trace left pleural effusion. 2.  Aortic Atherosclerosis (ICD10-I70.0). Electronically Signed   By: Iven Finn M.D.   On: 01/01/2021 21:12    Procedures Procedures   Medications  Ordered in ED Medications - No data to display  ED Course  I have reviewed the triage vital signs and the nursing notes.  Pertinent labs & imaging results that were available during my care of the patient were reviewed by me and considered in my medical decision making (see chart for details).    MDM Rules/Calculators/A&P                           CC: leg complaint  This patient complains of above; this involves an extensive number of treatment options and is a complaint that carries with it a high risk of complications and morbidity. Vital signs were reviewed. Serious etiologies considered.  Record review:  Previous records obtained and reviewed   Work up as above, notable for:  Labs & imaging results that were available during my care of the patient were reviewed by me and considered in my medical decision making.   I ordered imaging studies which included cxr and I independently visualized and interpreted imaging which showed stable.  BNP is mildly elevated, he has no dyspnea, cxr without overt volume overload, at this time doubt acute CHF Exacerbation. He has low risk well's score, d-dimer obtained which is elevated. Vascular testing not available after 7pm. Recommend start lovenox and pt will f/u in the morning for duplex.   EKG without acute ischemia, initial troponin is not elevated. No chest pain.  Question of small area of cellulitis to his RLE, otherwise appears to be stasis dermitis. Will start pt on keflex.    At this time patient signed out to incoming physician pending troponin and lovenox dosing. He is hemodynamically stable at this time.      This chart was dictated using voice recognition software.  Despite best efforts to proofread,  errors can occur which can change the documentation meaning.  Final Clinical Impression(s) / ED Diagnoses Final diagnoses:  Venous stasis dermatitis of both lower extremities  Left leg swelling  Cellulitis, unspecified  cellulitis site    Rx / DC Orders ED Discharge Orders          Ordered    cephALEXin (KEFLEX) 500 MG capsule  2 times daily        01/01/21 2332    LE VENOUS        01/01/21 2332             Jeanell Sparrow, DO 01/01/21 2356

## 2021-01-01 NOTE — ED Provider Notes (Signed)
Emergency Medicine Provider Triage Evaluation Note  Wayne Meadows , a 86 y.o. male  was evaluated in triage.  Pt complains of right lower leg pain since mid September.  Was given antibiotics by his doctor however this has not made it better.  Review of Systems  Positive: Pain, weeping Negative: Fevers, chills or palpitations  Physical Exam  BP (!) 166/70 (BP Location: Right Arm)   Pulse 73   Temp 97.8 F (36.6 C) (Oral)   Resp 20   Ht 6\' 2"  (1.88 m)   Wt 93 kg   SpO2 95%   BMI 26.32 kg/m  Gen:   Awake, no distress   Resp:  Normal effort  MSK:   Moves extremities without difficulty  Other:  Bandage and cellulitic right lower leg  Medical Decision Making  Medically screening exam initiated at 3:56 PM.  Appropriate orders placed.  FRASER BUSCHE was informed that the remainder of the evaluation will be completed by another provider, this initial triage assessment does not replace that evaluation, and the importance of remaining in the ED until their evaluation is complete.     Rhae Hammock, PA-C 01/01/21 [place]on, [place] A, DO 01/03/21 [phone]

## 2021-01-01 NOTE — ED Triage Notes (Signed)
Pt. Arrived POV c/o R. leg pain, swelling and redness. Pt. Has been seen by his [place] doctor who told him to come to the ED if his leg got worse.

## 2021-01-01 NOTE — Discharge Instructions (Addendum)
Please follow up with PCP regarding elevated blood pressure  Please follow up in the morning regarding lower extremity ultrasound to rule out a blood clot to your left leg.

## 2021-01-02 ENCOUNTER — Ambulatory Visit (HOSPITAL_COMMUNITY): Admission: RE | Admit: 2021-01-02 | Payer: No Typology Code available for payment source | Source: Ambulatory Visit

## 2021-01-02 LAB — TROPONIN I (HIGH SENSITIVITY): Troponin I (High Sensitivity): 4 ng/L (ref <18)

## 2021-01-02 MED ORDER — ENOXAPARIN SODIUM 100 MG/ML IJ SOSY
1.0000 mg/kg | PREFILLED_SYRINGE | Freq: Two times a day (BID) | INTRAMUSCULAR | Status: DC
Start: 2021-01-02 — End: 2021-01-02
  Filled 2021-01-02: qty 0.93

## 2021-01-02 MED ORDER — ENOXAPARIN SODIUM 100 MG/ML IJ SOSY
95.0000 mg | PREFILLED_SYRINGE | Freq: Two times a day (BID) | INTRAMUSCULAR | Status: DC
  Administered 2021-01-02: 95 mg via SUBCUTANEOUS
  Filled 2021-01-02: qty 0.95

## 2021-01-02 NOTE — Progress Notes (Signed)
ANTICOAGULATION CONSULT NOTE - Initial Consult  Pharmacy Consult for Lovenox Indication: DVT  Allergies  Allergen Reactions   Bee Venom Anaphylaxis    Has epi pen   Influenza Vaccines Other (See Comments)    "Mortally sick for 2 weeks"    Patient Measurements: Height: 6\' 2"  (188 cm) Weight: 93 kg (205 lb) IBW/kg (Calculated) : 82.2    Vital Signs: Temp: 97.8 F (36.6 C) (11/09 1539) Temp Source: Oral (11/09 1539) BP: 167/69 (11/10 0010) Pulse Rate: 77 (11/10 0010)  Labs: Recent Labs    01/01/21 2044 01/01/21 2323  HGB 11.4*  --   HCT 36.5*  --   PLT 328  --   CREATININE 1.98*  --   TROPONINIHS 4 4    Estimated Creatinine Clearance: 32.9 mL/min (A) (by C-G formula based on SCr of 1.98 mg/dL (H)).   Medical History: Past Medical History:  Diagnosis Date   Arthritis    Carotid stenosis    COPD (chronic obstructive pulmonary disease) ([place])    Coronary artery disease    S/p PCI 2011;  NSTEMI 12/12:  LHC/PCI 02/23/11: LAD 60% after the septal perforator, D1 occluded with distal collaterals, proximal RI 30-40%, AV circumflex stent patent with 60% stenosis after the stent, RCA 99%, EF 60-65%.  His RCA was treated with a bare-metal stent   CVA (cerebral infarction) 2011   Right cerebral; total obstruction of the right ICA   Diverticulitis    Hypertension    Rectal bleeding 07/2015   Renal carcinoma (HCC)    Stroke (HCC)    Tobacco abuse, in remission     Medications:  No oral anticoagulation PTA  Assessment: 86 yr male with c/o leg swelling, redness and pain Elevated D-dimer Dopplers ordered for 11/10 AM  Goal of Therapy:  Full anticoagulation Monitor platelets by anticoagulation protocol: Yes   Plan:  Lovenox 1 mg/kg sq q12h Follow CBC, renal function F/U dopplers  Nataya Bastedo, Toribio Harbour, PharmD 01/02/2021,12:35 AM

## 2021-03-04 ENCOUNTER — Encounter (HOSPITAL_COMMUNITY): Payer: Self-pay

## 2021-03-04 ENCOUNTER — Other Ambulatory Visit: Payer: Self-pay

## 2021-03-04 ENCOUNTER — Emergency Department (HOSPITAL_COMMUNITY): Payer: No Typology Code available for payment source

## 2021-03-04 ENCOUNTER — Inpatient Hospital Stay (HOSPITAL_COMMUNITY)
Admission: EM | Admit: 2021-03-04 | Discharge: 2021-03-11 | DRG: 603 | Disposition: A | Discharge status: Home Health | Payer: No Typology Code available for payment source | Attending: Family Medicine | Admitting: Family Medicine

## 2021-03-04 DIAGNOSIS — I10 Essential (primary) hypertension: Secondary | ICD-10-CM | POA: Diagnosis present

## 2021-03-04 DIAGNOSIS — M7989 Other specified soft tissue disorders: Secondary | ICD-10-CM | POA: Diagnosis not present

## 2021-03-04 DIAGNOSIS — Z6825 Body mass index (BMI) 25.0-25.9, adult: Secondary | ICD-10-CM

## 2021-03-04 DIAGNOSIS — I2583 Coronary atherosclerosis due to lipid rich plaque: Secondary | ICD-10-CM | POA: Diagnosis present

## 2021-03-04 DIAGNOSIS — I252 Old myocardial infarction: Secondary | ICD-10-CM

## 2021-03-04 DIAGNOSIS — Z887 Allergy status to serum and vaccine status: Secondary | ICD-10-CM

## 2021-03-04 DIAGNOSIS — J439 Emphysema, unspecified: Secondary | ICD-10-CM | POA: Diagnosis present

## 2021-03-04 DIAGNOSIS — L03116 Cellulitis of left lower limb: Secondary | ICD-10-CM | POA: Diagnosis present

## 2021-03-04 DIAGNOSIS — R809 Proteinuria, unspecified: Secondary | ICD-10-CM | POA: Diagnosis present

## 2021-03-04 DIAGNOSIS — N1832 Chronic kidney disease, stage 3b: Secondary | ICD-10-CM | POA: Diagnosis present

## 2021-03-04 DIAGNOSIS — Z8673 Personal history of transient ischemic attack (TIA), and cerebral infarction without residual deficits: Secondary | ICD-10-CM | POA: Diagnosis not present

## 2021-03-04 DIAGNOSIS — L299 Pruritus, unspecified: Secondary | ICD-10-CM | POA: Diagnosis present

## 2021-03-04 DIAGNOSIS — Z91030 Bee allergy status: Secondary | ICD-10-CM

## 2021-03-04 DIAGNOSIS — R059 Cough, unspecified: Secondary | ICD-10-CM

## 2021-03-04 DIAGNOSIS — Z79890 Hormone replacement therapy (postmenopausal): Secondary | ICD-10-CM

## 2021-03-04 DIAGNOSIS — Z955 Presence of coronary angioplasty implant and graft: Secondary | ICD-10-CM

## 2021-03-04 DIAGNOSIS — L03115 Cellulitis of right lower limb: Secondary | ICD-10-CM | POA: Diagnosis not present

## 2021-03-04 DIAGNOSIS — Z20822 Contact with and (suspected) exposure to covid-19: Secondary | ICD-10-CM | POA: Diagnosis present

## 2021-03-04 DIAGNOSIS — L03119 Cellulitis of unspecified part of limb: Secondary | ICD-10-CM | POA: Diagnosis present

## 2021-03-04 DIAGNOSIS — J9811 Atelectasis: Secondary | ICD-10-CM | POA: Diagnosis not present

## 2021-03-04 DIAGNOSIS — N183 Chronic kidney disease, stage 3 unspecified: Secondary | ICD-10-CM

## 2021-03-04 DIAGNOSIS — Z8249 Family history of ischemic heart disease and other diseases of the circulatory system: Secondary | ICD-10-CM

## 2021-03-04 DIAGNOSIS — E663 Overweight: Secondary | ICD-10-CM | POA: Diagnosis present

## 2021-03-04 DIAGNOSIS — Z79891 Long term (current) use of opiate analgesic: Secondary | ICD-10-CM

## 2021-03-04 DIAGNOSIS — F419 Anxiety disorder, unspecified: Secondary | ICD-10-CM | POA: Diagnosis present

## 2021-03-04 DIAGNOSIS — R931 Abnormal findings on diagnostic imaging of heart and coronary circulation: Secondary | ICD-10-CM

## 2021-03-04 DIAGNOSIS — Z789 Other specified health status: Secondary | ICD-10-CM

## 2021-03-04 DIAGNOSIS — R0781 Pleurodynia: Secondary | ICD-10-CM

## 2021-03-04 DIAGNOSIS — Z87891 Personal history of nicotine dependence: Secondary | ICD-10-CM | POA: Diagnosis not present

## 2021-03-04 DIAGNOSIS — E039 Hypothyroidism, unspecified: Secondary | ICD-10-CM | POA: Diagnosis present

## 2021-03-04 DIAGNOSIS — J441 Chronic obstructive pulmonary disease with (acute) exacerbation: Secondary | ICD-10-CM | POA: Diagnosis present

## 2021-03-04 DIAGNOSIS — Z7902 Long term (current) use of antithrombotics/antiplatelets: Secondary | ICD-10-CM

## 2021-03-04 DIAGNOSIS — R700 Elevated erythrocyte sedimentation rate: Secondary | ICD-10-CM | POA: Diagnosis present

## 2021-03-04 DIAGNOSIS — I2510 Atherosclerotic heart disease of native coronary artery without angina pectoris: Secondary | ICD-10-CM | POA: Diagnosis present

## 2021-03-04 DIAGNOSIS — R7989 Other specified abnormal findings of blood chemistry: Secondary | ICD-10-CM | POA: Diagnosis not present

## 2021-03-04 DIAGNOSIS — R079 Chest pain, unspecified: Secondary | ICD-10-CM | POA: Diagnosis not present

## 2021-03-04 DIAGNOSIS — Z79899 Other long term (current) drug therapy: Secondary | ICD-10-CM

## 2021-03-04 DIAGNOSIS — Z7982 Long term (current) use of aspirin: Secondary | ICD-10-CM

## 2021-03-04 DIAGNOSIS — R21 Rash and other nonspecific skin eruption: Secondary | ICD-10-CM | POA: Diagnosis present

## 2021-03-04 DIAGNOSIS — N179 Acute kidney failure, unspecified: Secondary | ICD-10-CM | POA: Diagnosis not present

## 2021-03-04 DIAGNOSIS — I129 Hypertensive chronic kidney disease with stage 1 through stage 4 chronic kidney disease, or unspecified chronic kidney disease: Secondary | ICD-10-CM | POA: Diagnosis present

## 2021-03-04 DIAGNOSIS — Z85528 Personal history of other malignant neoplasm of kidney: Secondary | ICD-10-CM

## 2021-03-04 DIAGNOSIS — I493 Ventricular premature depolarization: Secondary | ICD-10-CM | POA: Diagnosis present

## 2021-03-04 DIAGNOSIS — L0390 Cellulitis, unspecified: Secondary | ICD-10-CM | POA: Diagnosis not present

## 2021-03-04 DIAGNOSIS — J449 Chronic obstructive pulmonary disease, unspecified: Secondary | ICD-10-CM | POA: Diagnosis present

## 2021-03-04 LAB — CBC
HCT: 33.5 % — ABNORMAL LOW (ref 39.0–52.0)
Hemoglobin: 10.9 g/dL — ABNORMAL LOW (ref 13.0–17.0)
MCH: 29.7 pg (ref 26.0–34.0)
MCHC: 32.5 g/dL (ref 30.0–36.0)
MCV: 91.3 fL (ref 80.0–100.0)
Platelets: 455 10*3/uL — ABNORMAL HIGH (ref 150–400)
RBC: 3.67 MIL/uL — ABNORMAL LOW (ref 4.22–5.81)
RDW: 13.7 % (ref 11.5–15.5)
WBC: 13.1 10*3/uL — ABNORMAL HIGH (ref 4.0–10.5)
nRBC: 0 % (ref 0.0–0.2)

## 2021-03-04 LAB — COMPREHENSIVE METABOLIC PANEL
ALT: 10 U/L (ref 0–44)
AST: 14 U/L — ABNORMAL LOW (ref 15–41)
Albumin: 2.8 g/dL — ABNORMAL LOW (ref 3.5–5.0)
Alkaline Phosphatase: 86 U/L (ref 38–126)
Anion gap: 13 (ref 5–15)
BUN: 19 mg/dL (ref 8–23)
CO2: 24 mmol/L (ref 22–32)
Calcium: 8.5 mg/dL — ABNORMAL LOW (ref 8.9–10.3)
Chloride: 101 mmol/L (ref 98–111)
Creatinine, Ser: 2.38 mg/dL — ABNORMAL HIGH (ref 0.61–1.24)
GFR, Estimated: 26 mL/min — ABNORMAL LOW (ref >60)
Glucose, Bld: 92 mg/dL (ref 70–99)
Potassium: 4.7 mmol/L (ref 3.5–5.1)
Sodium: 138 mmol/L (ref 135–145)
Total Bilirubin: 0.6 mg/dL (ref 0.3–1.2)
Total Protein: 7.4 g/dL (ref 6.5–8.1)

## 2021-03-04 LAB — CBC WITH DIFFERENTIAL/PLATELET
Abs Immature Granulocytes: 0.07 10*3/uL (ref 0.00–0.07)
Basophils Absolute: 0.1 10*3/uL (ref 0.0–0.1)
Basophils Relative: 1 %
Eosinophils Absolute: 0.3 10*3/uL (ref 0.0–0.5)
Eosinophils Relative: 2 %
HCT: 38.1 % — ABNORMAL LOW (ref 39.0–52.0)
Hemoglobin: 11.9 g/dL — ABNORMAL LOW (ref 13.0–17.0)
Immature Granulocytes: 1 %
Lymphocytes Relative: 7 %
Lymphs Abs: 1.1 10*3/uL (ref 0.7–4.0)
MCH: 29.2 pg (ref 26.0–34.0)
MCHC: 31.2 g/dL (ref 30.0–36.0)
MCV: 93.6 fL (ref 80.0–100.0)
Monocytes Absolute: 1.3 10*3/uL — ABNORMAL HIGH (ref 0.1–1.0)
Monocytes Relative: 9 %
Neutro Abs: 12.2 10*3/uL — ABNORMAL HIGH (ref 1.7–7.7)
Neutrophils Relative %: 80 %
Platelets: 485 10*3/uL — ABNORMAL HIGH (ref 150–400)
RBC: 4.07 MIL/uL — ABNORMAL LOW (ref 4.22–5.81)
RDW: 13.7 % (ref 11.5–15.5)
WBC: 15 10*3/uL — ABNORMAL HIGH (ref 4.0–10.5)
nRBC: 0 % (ref 0.0–0.2)

## 2021-03-04 LAB — RESP PANEL BY RT-PCR (FLU A&B, COVID) ARPGX2
Influenza A by PCR: NEGATIVE
Influenza B by PCR: NEGATIVE
SARS Coronavirus 2 by RT PCR: NEGATIVE

## 2021-03-04 LAB — LACTIC ACID, PLASMA: Lactic Acid, Venous: 1.8 mmol/L (ref 0.5–1.9)

## 2021-03-04 LAB — TROPONIN I (HIGH SENSITIVITY)
Troponin I (High Sensitivity): 4 ng/L (ref <18)
Troponin I (High Sensitivity): 4 ng/L (ref <18)

## 2021-03-04 LAB — CREATININE, SERUM
Creatinine, Ser: 2.47 mg/dL — ABNORMAL HIGH (ref 0.61–1.24)
GFR, Estimated: 25 mL/min — ABNORMAL LOW (ref >60)

## 2021-03-04 MED ORDER — VANCOMYCIN HCL 1750 MG/350ML IV SOLN
1750.0000 mg | Freq: Once | INTRAVENOUS | Status: AC
  Administered 2021-03-04: 1750 mg via INTRAVENOUS
  Filled 2021-03-04: qty 350

## 2021-03-04 MED ORDER — HEPARIN SODIUM (PORCINE) 5000 UNIT/ML IJ SOLN
5000.0000 [IU] | Freq: Three times a day (TID) | INTRAMUSCULAR | Status: DC
  Administered 2021-03-04 – 2021-03-07 (×10): 5000 [IU] via SUBCUTANEOUS
  Filled 2021-03-04 (×11): qty 1

## 2021-03-04 MED ORDER — ACETAMINOPHEN 650 MG RE SUPP: 650.0000 mg | Freq: Four times a day (QID) | RECTAL | Status: DC | PRN

## 2021-03-04 MED ORDER — HYDROCODONE-ACETAMINOPHEN 5-325 MG PO TABS
1.0000 | ORAL_TABLET | Freq: Four times a day (QID) | ORAL | Status: DC | PRN
  Administered 2021-03-04 – 2021-03-06 (×5): 1 via ORAL
  Filled 2021-03-04 (×5): qty 1

## 2021-03-04 MED ORDER — IPRATROPIUM-ALBUTEROL 0.5-2.5 (3) MG/3ML IN SOLN
3.0000 mL | Freq: Four times a day (QID) | RESPIRATORY_TRACT | Status: DC
  Administered 2021-03-04 – 2021-03-08 (×13): 3 mL via RESPIRATORY_TRACT
  Filled 2021-03-04 (×14): qty 3

## 2021-03-04 MED ORDER — ACETAMINOPHEN 325 MG PO TABS
650.0000 mg | ORAL_TABLET | Freq: Four times a day (QID) | ORAL | Status: DC | PRN
  Administered 2021-03-04 – 2021-03-05 (×2): 650 mg via ORAL
  Filled 2021-03-04 (×2): qty 2

## 2021-03-04 MED ORDER — ONDANSETRON HCL 4 MG PO TABS: 4.0000 mg | ORAL_TABLET | Freq: Four times a day (QID) | ORAL | Status: DC | PRN

## 2021-03-04 MED ORDER — LEVOTHYROXINE SODIUM 50 MCG PO TABS
50.0000 ug | ORAL_TABLET | Freq: Every day | ORAL | Status: DC
  Administered 2021-03-05 – 2021-03-11 (×7): 50 ug via ORAL
  Filled 2021-03-04 (×7): qty 1

## 2021-03-04 MED ORDER — FERROUS SULFATE 325 (65 FE) MG PO TABS
325.0000 mg | ORAL_TABLET | ORAL | Status: DC
  Administered 2021-03-05 – 2021-03-10 (×3): 325 mg via ORAL
  Filled 2021-03-04 (×4): qty 1

## 2021-03-04 MED ORDER — AMLODIPINE BESYLATE 10 MG PO TABS
10.0000 mg | ORAL_TABLET | Freq: Every day | ORAL | Status: DC
  Administered 2021-03-05 – 2021-03-11 (×7): 10 mg via ORAL
  Filled 2021-03-04 (×7): qty 1

## 2021-03-04 MED ORDER — ASPIRIN EC 81 MG PO TBEC
81.0000 mg | DELAYED_RELEASE_TABLET | Freq: Every day | ORAL | Status: DC
  Administered 2021-03-05 – 2021-03-11 (×7): 81 mg via ORAL
  Filled 2021-03-04 (×7): qty 1

## 2021-03-04 MED ORDER — VANCOMYCIN HCL 1250 MG/250ML IV SOLN: 1250.0000 mg | INTRAVENOUS | Status: DC

## 2021-03-04 MED ORDER — MOMETASONE FUROATE 220 MCG/INH IN AEPB: 1.0000 | INHALATION_SPRAY | Freq: Every day | RESPIRATORY_TRACT | Status: DC

## 2021-03-04 MED ORDER — SODIUM CHLORIDE 0.9 % IV SOLN: 2.0000 g | INTRAVENOUS | Status: DC

## 2021-03-04 MED ORDER — SODIUM CHLORIDE 0.9 % IV SOLN: INTRAVENOUS | Status: DC

## 2021-03-04 MED ORDER — CLOPIDOGREL BISULFATE 75 MG PO TABS
75.0000 mg | ORAL_TABLET | Freq: Every day | ORAL | Status: DC
  Administered 2021-03-05 – 2021-03-11 (×7): 75 mg via ORAL
  Filled 2021-03-04 (×7): qty 1

## 2021-03-04 MED ORDER — VANCOMYCIN HCL IN DEXTROSE 1-5 GM/200ML-% IV SOLN
1000.0000 mg | Freq: Once | INTRAVENOUS | Status: DC
  Filled 2021-03-04: qty 200

## 2021-03-04 MED ORDER — DOCUSATE SODIUM 100 MG PO CAPS
100.0000 mg | ORAL_CAPSULE | Freq: Two times a day (BID) | ORAL | Status: DC
  Administered 2021-03-04 – 2021-03-08 (×4): 100 mg via ORAL
  Filled 2021-03-04 (×12): qty 1

## 2021-03-04 MED ORDER — ONDANSETRON HCL 4 MG/2ML IJ SOLN
4.0000 mg | Freq: Four times a day (QID) | INTRAMUSCULAR | Status: DC | PRN
  Administered 2021-03-07: 4 mg via INTRAVENOUS
  Filled 2021-03-04: qty 2

## 2021-03-04 MED ORDER — SODIUM CHLORIDE 0.9 % IV SOLN
2.0000 g | Freq: Once | INTRAVENOUS | Status: AC
  Administered 2021-03-04: 2 g via INTRAVENOUS
  Filled 2021-03-04: qty 2

## 2021-03-04 MED ORDER — CLONIDINE HCL 0.1 MG PO TABS
0.1000 mg | ORAL_TABLET | Freq: Every day | ORAL | Status: DC
  Administered 2021-03-05 – 2021-03-11 (×7): 0.1 mg via ORAL
  Filled 2021-03-04 (×7): qty 1

## 2021-03-04 MED ORDER — PANTOPRAZOLE SODIUM 40 MG PO TBEC
40.0000 mg | DELAYED_RELEASE_TABLET | Freq: Two times a day (BID) | ORAL | Status: DC
  Administered 2021-03-04 – 2021-03-11 (×14): 40 mg via ORAL
  Filled 2021-03-04 (×14): qty 1

## 2021-03-04 MED ORDER — IPRATROPIUM-ALBUTEROL 20-100 MCG/ACT IN AERS: 1.0000 | INHALATION_SPRAY | Freq: Four times a day (QID) | RESPIRATORY_TRACT | Status: DC

## 2021-03-04 MED ORDER — HYDROCODONE-ACETAMINOPHEN 10-325 MG PO TABS: 1.0000 | ORAL_TABLET | Freq: Two times a day (BID) | ORAL | Status: DC

## 2021-03-04 NOTE — ED Triage Notes (Addendum)
Patient reports that he has had redness and swelling since 09/22. Patient states he has had 2 rounds of antibiotics, but "after finishing it worked for a while, but now is much worse." Patient's lower extremities are weeping and red.   Patient added that he has rash on his abdomen x 2 weeks.

## 2021-03-04 NOTE — H&P (Signed)
History and Physical    CHRISTIA COAXUM XFG:[id number] DOB: 1934-05-17 DOA: 03/04/2021  PCP: [name]   Patient coming from:  Home  I have personally briefly reviewed patient's old medical records in [place]  Chief Complaint: Pain and redness of both lower extremities.  HPI: Wayne Meadows is a 87 y.o. male with PMH significant for essential hypertension, CAD with stents, history of CVA, COPD, arthritis, tobacco abuse in remission presented in the ED with complaints of pain, swelling and redness of both lower extremities.  Patient reports that he has been dealing with recurrent cellulitis in both lower extremities for last 2 months.  He has completed 2 courses of antibiotics and cellulitis has improved, now he has recurrence of redness, pain and swelling in both lower extremities.  He denies any trauma, injury, fall.  He denies any fever, chills, cough, sore throat, runny nose, headache, sick contact, recent travel.  ED Course: He was hemodynamically stable except hypertension. Temp 98.6, HR 95, RR 18, BP 181/78, SPO2 96% on room air. Labs include sodium 138, potassium 4.7, chloride 101, bicarb 24, glucose 92, BUN 19, creatinine 2.38, calcium 8.5, anion gap 13, alkaline phosphatase 86, albumin 2.8, AST 14, ALT 10, total bilirubin 0.6, troponin 4> 4,, lactic acid 1.9, WBC 15.0, hemoglobin 11.9, hematocrit 38.1, platelet 485, influenza negative, COVID-negative, chest x-ray no infiltrate.   Review of Systems:  Review of Systems  Constitutional: Negative.   HENT: Negative.    Eyes: Negative.   Respiratory: Negative.    Cardiovascular: Negative.   Gastrointestinal: Negative.   Genitourinary: Negative.   Musculoskeletal: Negative.   Skin:  Positive for rash.       Cellulitis of both lower extremities.  Neurological: Negative.   Endo/Heme/Allergies: Negative.   Psychiatric/Behavioral: Negative.     Past Medical History:  Diagnosis Date   Arthritis    Carotid  stenosis    COPD (chronic obstructive pulmonary disease) ([place])    Coronary artery disease    S/p PCI 2011;  NSTEMI 12/12:  LHC/PCI 02/23/11: LAD 60% after the septal perforator, D1 occluded with distal collaterals, proximal RI 30-40%, AV circumflex stent patent with 60% stenosis after the stent, RCA 99%, EF 60-65%.  His RCA was treated with a bare-metal stent   CVA (cerebral infarction) 2011   Right cerebral; total obstruction of the right ICA   Diverticulitis    Hypertension    Rectal bleeding 07/2015   Renal carcinoma (H[facility])    Stroke ([place])    Tobacco abuse, in remission     Past Surgical History:  Procedure Laterality Date   COLON SURGERY     COLONOSCOPY N/A 08/22/2015   Procedure: COLONOSCOPY;  Surgeon: [name], MD;  Location: [facility] ENDOSCOPY;  Service: Endoscopy;  Laterality: N/A;   ESOPHAGOGASTRODUODENOSCOPY N/A 07/18/2020   Procedure: ESOPHAGOGASTRODUODENOSCOPY (EGD);  Surgeon: Milus Banister, MD;  Location: [facility];  Service: Endoscopy;  Laterality: N/A;   ESOPHAGOGASTRODUODENOSCOPY (EGD) WITH PROPOFOL N/A 06/21/2020   Procedure: ESOPHAGOGASTRODUODENOSCOPY (EGD) WITH PROPOFOL;  Surgeon: Irene Shipper, MD;  Location: [facility] ENDOSCOPY;  Service: Endoscopy;  Laterality: N/A;   EUS N/A 07/18/2020   Procedure: UPPER ENDOSCOPIC ULTRASOUND (EUS) RADIAL;  Surgeon: Milus Banister, MD;  Location: [facility] ENDOSCOPY;  Service: Endoscopy;  Laterality: N/A;   FINE NEEDLE ASPIRATION N/A 07/18/2020   Procedure: FINE NEEDLE ASPIRATION (FNA) LINEAR;  Surgeon: Milus Banister, MD;  Location: [facility] ENDOSCOPY;  Service: Endoscopy;  Laterality: N/A;   hip relacement  KIDNEY SURGERY     LEFT HEART CATH AND CORONARY ANGIOGRAPHY N/A 07/09/2020   Procedure: LEFT HEART CATH AND CORONARY ANGIOGRAPHY;  Surgeon: Leonie Man, MD;  Location: [facility];  Service: Cardiovascular;  Laterality: N/A;   LEFT HEART CATHETERIZATION WITH CORONARY ANGIOGRAM N/A 02/23/2011   Procedure: LEFT HEART  CATHETERIZATION WITH CORONARY ANGIOGRAM;  Surgeon: [name], MD;  Location: [facility] CATH LAB;  Service: Cardiovascular;  Laterality: N/A;   LEFT HEART CATHETERIZATION WITH CORONARY ANGIOGRAM N/A 03/18/2011   Procedure: LEFT HEART CATHETERIZATION WITH CORONARY ANGIOGRAM;  Surgeon: [name], MD;  Location: [facility] CATH LAB;  Service: Cardiovascular;  Laterality: N/A;   PERCUTANEOUS CORONARY STENT INTERVENTION (PCI-S) N/A 02/23/2011   Procedure: PERCUTANEOUS CORONARY STENT INTERVENTION (PCI-S);  Surgeon: [name], MD;  Location: [facility] CATH LAB;  Service: Cardiovascular;  Laterality: N/A;   TEMPORARY PACEMAKER INSERTION N/A 02/23/2011   Procedure: TEMPORARY PACEMAKER INSERTION;  Surgeon: [name], MD;  Location[facility] CATH LAB;  Service: Cardiovascular;  Laterality: N/A;     reports that he quit smoking about 14 years ago. His smoking use included cigarettes. He has never used smokeless tobacco. He reports that he does not currently use alcohol after a past usage of about 1.0 standard drink per week. He reports that he does not use drugs.  Allergies  Allergen Reactions   Bee Venom Anaphylaxis    Has epi pen   Influenza Vaccines Other (See Comments)    "Mortally sick for 2 weeks"    Family History  Problem Relation Age of Onset   Heart attack Other 64   Family history reviewed and not pertinent .  Prior to Admission medications   Medication Sig Start Date End Date Taking? Authorizing Provider  albuterol (VENTOLIN HFA) 108 (90 Base) MCG/ACT inhaler Inhale 1 puff into the lungs every 6 (six) hours as needed for shortness of breath.    [provider]  amLODipine (NORVASC) 10 MG tablet Take 1 tablet (10 mg total) by mouth daily. 07/11/20   Margie Billet, NP  aspirin EC 81 MG tablet Take 81 mg by mouth daily.    [provider]  baclofen (LIORESAL) 10 MG tablet Take 10 mg by mouth at bedtime.    [provider]  cloNIDine (CATAPRES) 0.1 MG tablet Take 1 tablet  (0.1 mg total) by mouth daily. 06/24/20   Harvie Heck, MD  clopidogrel (PLAVIX) 75 MG tablet Take 75 mg by mouth daily.    [provider]  diclofenac Sodium (VOLTAREN) 1 % GEL Apply 2 g topically 4 (four) times daily. 06/23/20   Harvie Heck, MD  enalapril (VASOTEC) 20 MG tablet Take 20 mg by mouth 2 (two) times daily. 1 whole tablet in the morning, and 1/2 tablet in the evenings    [provider]  EPINEPHrine (EPI-PEN) 0.3 mg/0.3 mL DEVI Inject 0.3 mg into the muscle once as needed (for anaphylactic reaction).     [provider]  ferrous sulfate 325 (65 FE) MG tablet Take 1 tablet by mouth. Every Monday, Wednesday, and Friday 07/30/20   [provider]  gabapentin (NEURONTIN) 300 MG capsule Take 300-600 mg by mouth See admin instructions. Take 300 mg by mouth in the morning then 300 mg at noon then 600 mg at bedtime    [provider]  HYDROcodone-acetaminophen (NORCO) 10-325 MG tablet Take 1 tablet by mouth 2 (two) times daily.    [provider]  Ipratropium-Albuterol (COMBIVENT) 20-100 MCG/ACT  AERS respimat Inhale 1 puff into the lungs 4 (four) times daily.    [provider]  levothyroxine (SYNTHROID) 50 MCG tablet Take 50 mcg by mouth daily before breakfast.    [provider]  LORazepam (ATIVAN) 0.5 MG tablet 1 PO 1 hour before MRI repeat after 30 minutes if needed 08/21/20   [name], MD  Melatonin 3 MG TABS Take 6 mg by mouth at bedtime.    [provider]  metoprolol tartrate (LOPRESSOR) 25 MG tablet Take 25 mg by mouth daily.    [provider]  mometasone (ASMANEX) 220 MCG/INH inhaler Inhale 1 puff into the lungs at bedtime.    [provider]  Multiple Vitamin (MULTIVITAMIN) tablet Take 1 tablet by mouth daily.    [provider]  nitroGLYCERIN (NITROSTAT) 0.4 MG SL tablet Place 0.4 mg under the tongue every 5 (five) minutes as needed for chest pain.    [provider]  ondansetron (ZOFRAN) 4 MG tablet Take 1 tablet (4 mg total) by mouth every 6 (six) hours as needed for nausea. 06/23/20   Harvie Heck, MD  pantoprazole (PROTONIX) 40 MG tablet Take 1 tablet (40 mg total) by mouth 2 (two) times daily. 06/23/20   Harvie Heck, MD  polyethylene glycol (MIRALAX / GLYCOLAX) 17 g packet Take 17 g by mouth daily as needed for mild constipation or moderate constipation. 08/15/20   [name], MD  pravastatin (PRAVACHOL) 40 MG tablet Take 40 mg by mouth at bedtime.    [provider]  vitamin B-12 (CYANOCOBALAMIN) 500 MCG tablet Take 2 tablets by mouth every Monday, Wednesday, and Friday. 07/29/20   [provider]    Physical Exam: Vitals:   03/04/21 1205 03/04/21 1213 03/04/21 1345 03/04/21 1520  BP: (!) 181/78  (!) 173/123 (!) 127/109  Pulse: 95  92 84  Resp: 18  18 18   Temp: 98.6 F (37 C)   99.2 F (37.3 C)  TempSrc: Oral   Oral  SpO2: 96%  92% 94%  Weight:  88.5 kg    Height:  6\' 2"  (1.88 m)      Constitutional: Appears comfortable, not in any acute distress. Vitals:   03/04/21 1205 03/04/21 1213 03/04/21 1345 03/04/21 1520  BP: (!) 181/78  (!) 173/123 (!) 127/109  Pulse: 95  92 84  Resp: 18  18 18   Temp: 98.6 F (37 C)   99.2 F (37.3 C)  TempSrc: Oral   Oral  SpO2: 96%  92% 94%  Weight:  88.5 kg    Height:  6\' 2"  (1.88 m)     Eyes: PERRL, lids and conjunctivae normal ENMT: Mucous membranes are moist.  Posterior pharynx without exudate.  Normal dentition.  Neck: normal, supple, no masses, no thyromegaly Respiratory: Clear to auscultation bilaterally, no wheezing, no crackles.  No accessory muscle use.  Cardiovascular: S1-S2 heard, regular rate and rhythm, no murmur.   Abdomen: Soft, distended, nontender, abdominal hernia noted, BS+ Musculoskeletal:  Good ROM, no contractures. Normal muscle tone.  Skin: Both lower extremities swollen, erythematous, warm, tender, with yellow excoriation. Neurologic: CN 2-12 grossly intact.  Sensation intact, DTR normal. Strength 5/5 in all 4.  Psychiatric: Normal judgment and insight. Alert and oriented x 3. Normal mood.    Labs on Admission: I have personally reviewed following labs and imaging studies  CBC: Recent Labs  Lab 03/04/21 1256 03/04/21 1427  WBC 15.0* 13.1*  NEUTROABS 12.2*  --   HGB 11.9* 10.9*  HCT 38.1* 33.5*  MCV 93.6 91.3  PLT 485* 492*   Basic Metabolic Panel: Recent Labs  Lab 03/04/21 1256 03/04/21 1427  NA 138  --   K 4.7  --   CL 101  --   CO2 24  --   GLUCOSE 92  --   BUN 19  --   CREATININE 2.38* 2.47*  CALCIUM 8.5*  --    GFR: Estimated Creatinine Clearance: 25.9 mL/min (A) (by C-G formula based on SCr of 2.47 mg/dL (H)). Liver Function Tests: Recent Labs  Lab 03/04/21 1256  AST 14*  ALT 10  ALKPHOS 86  BILITOT 0.6  PROT 7.4  ALBUMIN 2.8*   No results for input(s): LIPASE, AMYLASE in the last 168 hours. No results for input(s): AMMONIA in the last 168 hours. Coagulation Profile: No results for input(s): INR, PROTIME in the last 168 hours. Cardiac Enzymes: No results for input(s): CKTOTAL, CKMB, CKMBINDEX, TROPONINI in the last 168 hours. BNP (last 3 results) No results for input(s): PROBNP in the last 8760 hours. HbA1C: No results for input(s): HGBA1C in the last 72 hours. CBG: No results for input(s): GLUCAP in the last 168 hours. Lipid Profile: No results for input(s): CHOL, HDL, LDLCALC, TRIG, CHOLHDL, LDLDIRECT in the last 72 hours. Thyroid Function Tests: No results for input(s): TSH, T4TOTAL, FREET4, T3FREE, THYROIDAB in the last 72 hours. Anemia Panel: No results for input(s): VITAMINB12, FOLATE, FERRITIN, TIBC, IRON, RETICCTPCT in the last 72 hours. Urine analysis:    Component Value Date/Time   COLORURINE STRAW (A) 08/12/2020 0818   APPEARANCEUR CLEAR 08/12/2020 0818   LABSPEC 1.006 08/12/2020 0818   PHURINE 8.0 08/12/2020 0818   GLUCOSEU NEGATIVE 08/12/2020 0818   HGBUR NEGATIVE 08/12/2020 0818    BILIRUBINUR NEGATIVE 08/12/2020 0818   KETONESUR NEGATIVE 08/12/2020 0818   PROTEINUR 100 (A) 08/12/2020 0818   NITRITE NEGATIVE 08/12/2020 0818   LEUKOCYTESUR NEGATIVE 08/12/2020 0818    Radiological Exams on Admission: DG Chest 2 View  Result Date: 03/04/2021 CLINICAL DATA:  Productive cough EXAM: CHEST - 2 VIEW COMPARISON:  January 01, 2021. FINDINGS: Similar right basilar atelectasis. Suspected trace bilateral pleural effusions. No consolidation. No visible pleural effusions or pneumothorax. Incompletely imaged lumbar fusion hardware. IMPRESSION: 1. Similar right basilar atelectasis. 2. Suspected trace bilateral pleural effusions. 3. Chronic hyperinflation. Electronically Signed   By: Margaretha Sheffield M.D.   On: 03/04/2021 13:11    EKG: Ordered.  please review EKG  Assessment/Plan Principal Problem:   Recurrent cellulitis of lower leg Active Problems:   Coronary artery disease due to lipid rich plaque   COPD (chronic obstructive pulmonary disease) (HCC)   Hypertension   Overweight (BMI 25.0-29.9)   Recurrent cellulitis of both lower extremities: Patient presented with pain, erythema, swelling consistent with cellulitis Patient has completed 2 courses of p.o. antibiotics. He presented with recurrence of cellulitis. Continue vancomycin and cefepime. Follow blood cultures and urine culture, continue to follow clinical course. There is no signs of sepsis.  Essential hypertension: Resume home medications. Amlodipine, clonidine, metoprolol.  Hold lisinopril given AKI  AKI on CKD stage IIIa: Baseline serum creatinine 1.4-1.5 Presented with serum creatinine 2.38 Suspect prerenal, continue IV hydration. Continue to monitor serum creatinine  CAD s/p stents: Continue aspirin, Plavix and pravastatin.  Hypothyroidism: Continue levothyroxine  Anxiety disorder: Continue lorazepam.  COPD: Not in exacerbation,  Continue home inhalers.    DVT prophylaxis: Heparin Code  Status: Full code Family Communication: No family at bedside Disposition Plan:  Status is: Inpatient  Remains inpatient appropriate because: Admitted for recurrent cellulitis of lower extremities requiring IV antibiotics  Consults called: None Admission status: Inpatient   Shawna Clamp MD [facility]   If 7PM-7AM, please contact night-coverage   03/04/2021, 4:15 PM

## 2021-03-04 NOTE — Progress Notes (Signed)
Pharmacy Antibiotic Note  Wayne Meadows is a 87 y.o. male admitted on 03/04/2021 with cellulitis.  Pharmacy has been consulted for vancomycin & cefepime dosing.  Plan: Cefepime 2 gm IV q24 Vancomycin 1750 mg IV x 1 then vancomycin 1250 mg IV q36 for est AUC 489.9, Css min 12.6 using SCr 2.38 and Vd 0.72 F/u renal function, WBC, temp, culture data  Vancomycin levels as needed  Height: 6\' 2"  (188 cm) Weight: 88.5 kg (195 lb) IBW/kg (Calculated) : 82.2  Temp (24hrs), Avg:98.6 F (37 C), Min:98.6 F (37 C), Max:98.6 F (37 C)  Recent Labs  Lab 03/04/21 1256  WBC 15.0*  CREATININE 2.38*  LATICACIDVEN 1.8    Estimated Creatinine Clearance: 26.9 mL/min (A) (by C-G formula based on SCr of 2.38 mg/dL (H)).    Allergies  Allergen Reactions   Bee Venom Anaphylaxis    Has epi pen   Influenza Vaccines Other (See Comments)    "Mortally sick for 2 weeks"    Antimicrobials this admission: Cefepime 1/10>> Vanco 1/10>>  Dose adjustments this admission:  Microbiology results  Thank you for allowing pharmacy to be a part of this patients care.  Eudelia Bunch, Pharm.D 03/04/2021 3:00 PM

## 2021-03-04 NOTE — ED Provider Triage Note (Signed)
Emergency Medicine Provider Triage Evaluation Note  Wayne Meadows , a 87 y.o. male  was evaluated in triage.  Pt complains of worsening swelling and redness to bilateral lower extremities.  States that swelling and redness started September 2022 and has gotten progressively worse since then.    Patient also endorses new productive cough and shortness of breath with exertion.  States that this has began over the last few days.  Patient endorses history of COPD.  States he is not on any home oxygen.  Review of Systems  Positive: Shortness of breath, productive cough, swelling and redness to bilateral lower extremities,  Negative: Fever, chest pain, lightheadedness, syncope numbness, weakness  Physical Exam  BP (!) 181/78 (BP Location: Left Arm)    Pulse 95    Temp 98.6 F (37 C) (Oral)    Resp 18    Ht 6\' 2"  (1.88 m)    Wt 88.5 kg    SpO2 96%    BMI 25.04 kg/m  Gen:   Awake, no distress   Resp:  Normal effort, lungs clear to auscultation bilaterally MSK:   Moves extremities without difficulty; patient has edema and erythema to bilateral lower extremities.  Dry skin as well as wounds present to bilateral lower extremities. Other:    Medical Decision Making  Medically screening exam initiated at 12:20 PM.  Appropriate orders placed.  Wayne Meadows was informed that the remainder of the evaluation will be completed by another provider, this initial triage assessment does not replace that evaluation, and the importance of remaining in the ED until their evaluation is complete.     Wayne Meadows, [place] 03/04/21 1226

## 2021-03-04 NOTE — Plan of Care (Signed)
°  Problem: Clinical Measurements: Goal: Ability to maintain clinical measurements within normal limits will improve Outcome: Progressing   Problem: Clinical Measurements: Goal: Will remain free from infection Outcome: Progressing   Problem: Pain Managment: Goal: General experience of comfort will improve Outcome: Progressing   Problem: Safety: Goal: Ability to remain free from injury will improve Outcome: Progressing

## 2021-03-04 NOTE — ED Provider Notes (Signed)
[facility] DEPT Provider Note   CSN: [id number] Arrival date & time: 03/04/21  1159     History  No chief complaint on file.   Wayne Meadows is a 87 y.o. male.  Pt presents to the ED today with redness and swelling to both legs.  He said he's been through 2 rounds of antibiotics.  The antibiotics take the cellulitis away, but it comes back.  He said he has a lot of pain in his legs and the cellulitis is worse that prior episodes.  He denies f/c.      Home Medications Prior to Admission medications   Medication Sig Start Date End Date Taking? Authorizing Provider  albuterol (VENTOLIN HFA) 108 (90 Base) MCG/ACT inhaler Inhale 1 puff into the lungs every 6 (six) hours as needed for shortness of breath.   Yes [provider]  amLODipine (NORVASC) 10 MG tablet Take 1 tablet (10 mg total) by mouth daily. 07/11/20  Yes Margie Billet, NP  aspirin EC 81 MG tablet Take 81 mg by mouth daily.   Yes [provider]  baclofen (LIORESAL) 10 MG tablet Take 10 mg by mouth at bedtime.   Yes [provider]  cloNIDine (CATAPRES) 0.1 MG tablet Take 1 tablet (0.1 mg total) by mouth daily. 06/24/20  Yes Aslam, Loralyn Freshwater, MD  clopidogrel (PLAVIX) 75 MG tablet Take 75 mg by mouth daily.   Yes [provider]  enalapril (VASOTEC) 20 MG tablet Take 20 mg by mouth 2 (two) times daily. 1 whole tablet in the morning, and 1/2 tablet in the evenings   Yes [provider]  EPINEPHrine (EPI-PEN) 0.3 mg/0.3 mL DEVI Inject 0.3 mg into the muscle once as needed (for anaphylactic reaction).    Yes [provider]  ferrous sulfate 325 (65 FE) MG tablet Take 1 tablet by mouth. Every Monday, Wednesday, and Friday 07/30/20  Yes [provider]  gabapentin (NEURONTIN) 300 MG capsule Take 300-600 mg by mouth See admin instructions. Take 300 mg by mouth in the morning then 300 mg at noon then 600 mg at bedtime   Yes [provider]   HYDROcodone-acetaminophen (NORCO) 10-325 MG tablet Take 1 tablet by mouth 2 (two) times daily.   Yes [provider]  Ipratropium-Albuterol (COMBIVENT) 20-100 MCG/ACT AERS respimat Inhale 1 puff into the lungs 4 (four) times daily.   Yes [provider]  levothyroxine (SYNTHROID) 50 MCG tablet Take 50 mcg by mouth daily before breakfast.   Yes [provider]  Melatonin 3 MG TABS Take 6 mg by mouth at bedtime.   Yes [provider]  metoprolol tartrate (LOPRESSOR) 25 MG tablet Take 25 mg by mouth daily.   Yes [provider]  Multiple Vitamin (MULTIVITAMIN) tablet Take 1 tablet by mouth daily.   Yes [provider]  nitroGLYCERIN (NITROSTAT) 0.4 MG SL tablet Place 0.4 mg under the tongue every 5 (five) minutes as needed for chest pain.   Yes [provider]  pantoprazole (PROTONIX) 40 MG tablet Take 1 tablet (40 mg total) by mouth 2 (two) times daily. 06/23/20  Yes Aslam, Loralyn Freshwater, MD  pravastatin (PRAVACHOL) 40 MG tablet Take 40 mg by mouth at bedtime.   Yes [provider]  diclofenac Sodium (VOLTAREN) 1 % GEL Apply 2 g topically 4 (four) times daily. Patient not taking: Reported on 03/04/2021 06/23/20   Harvie Heck, MD  LORazepam (ATIVAN) 0.5 MG tablet 1 PO 1 hour before MRI repeat after  30 minutes if needed Patient not taking: Reported on 03/04/2021 08/21/20   [name], MD  ondansetron (ZOFRAN) 4 MG tablet Take 1 tablet (4 mg total) by mouth every 6 (six) hours as needed for nausea. Patient not taking: Reported on 03/04/2021 06/23/20   Harvie Heck, MD  polyethylene glycol (MIRALAX / GLYCOLAX) 17 g packet Take 17 g by mouth daily as needed for mild constipation or moderate constipation. Patient not taking: Reported on 03/04/2021 08/15/20   [name], MD      Allergies    Bee venom and Influenza vaccines    Review of Systems   Review of Systems  Skin:  Positive for color change and rash.  All other systems  reviewed and are negative.  Physical Exam Updated Vital Signs BP (!) 173/123    Pulse 92    Temp 98.6 F (37 C) (Oral)    Resp 18    Ht 6\' 2"  (1.88 m)    Wt 88.5 kg    SpO2 92%    BMI 25.04 kg/m  Physical Exam Vitals and nursing note reviewed.  Constitutional:      Appearance: Normal appearance.  HENT:     Head: Normocephalic and atraumatic.     Right Ear: External ear normal.     Left Ear: External ear normal.     Nose: Nose normal.     Mouth/Throat:     Mouth: Mucous membranes are moist.     Pharynx: Oropharynx is clear.  Eyes:     Extraocular Movements: Extraocular movements intact.     Conjunctiva/sclera: Conjunctivae normal.     Pupils: Pupils are equal, round, and reactive to light.  Cardiovascular:     Rate and Rhythm: Normal rate and regular rhythm.     Pulses: Normal pulses.     Heart sounds: Normal heart sounds.  Pulmonary:     Effort: Pulmonary effort is normal.     Breath sounds: Normal breath sounds.  Abdominal:     General: Abdomen is flat. Bowel sounds are normal.     Palpations: Abdomen is soft.  Musculoskeletal:        General: Normal range of motion.     Cervical back: Normal range of motion and neck supple.     Right lower leg: Edema present.     Left lower leg: Edema present.  Skin:    Capillary Refill: Capillary refill takes less than 2 seconds.     Comments: Bilateral LE cellulitis with drainage.  See picture.  Neurological:     General: No focal deficit present.     Mental Status: He is alert and oriented to person, place, and time.  Psychiatric:        Mood and Affect: Mood normal.        Behavior: Behavior normal.      ED Results / Procedures / Treatments   Labs (all labs ordered are listed, but only abnormal results are displayed) Labs Reviewed  COMPREHENSIVE METABOLIC PANEL - Abnormal; Notable for the following components:      Result Value   Creatinine, Ser 2.38 (*)    Calcium 8.5 (*)    Albumin 2.8 (*)    AST 14 (*)    GFR,  Estimated 26 (*)    All other components within normal limits  CBC WITH DIFFERENTIAL/PLATELET - Abnormal; Notable for the following components:   WBC 15.0 (*)    RBC 4.07 (*)    Hemoglobin 11.9 (*)  HCT 38.1 (*)    Platelets 485 (*)    Neutro Abs 12.2 (*)    Monocytes Absolute 1.3 (*)    All other components within normal limits  RESP PANEL BY RT-PCR (FLU A&B, COVID) ARPGX2  LACTIC ACID, PLASMA  CBC  CREATININE, SERUM  TROPONIN I (HIGH SENSITIVITY)  TROPONIN I (HIGH SENSITIVITY)    EKG None  Radiology DG Chest 2 View  Result Date: 03/04/2021 CLINICAL DATA:  Productive cough EXAM: CHEST - 2 VIEW COMPARISON:  January 01, 2021. FINDINGS: Similar right basilar atelectasis. Suspected trace bilateral pleural effusions. No consolidation. No visible pleural effusions or pneumothorax. Incompletely imaged lumbar fusion hardware. IMPRESSION: 1. Similar right basilar atelectasis. 2. Suspected trace bilateral pleural effusions. 3. Chronic hyperinflation. Electronically Signed   By: Margaretha Sheffield M.D.   On: 03/04/2021 13:11    Procedures Procedures    Medications Ordered in ED Medications  aspirin EC tablet 81 mg (has no administration in time range)  amLODipine (NORVASC) tablet 10 mg (has no administration in time range)  cloNIDine (CATAPRES) tablet 0.1 mg (has no administration in time range)  levothyroxine (SYNTHROID) tablet 50 mcg (has no administration in time range)  pantoprazole (PROTONIX) EC tablet 40 mg (has no administration in time range)  clopidogrel (PLAVIX) tablet 75 mg (has no administration in time range)  ferrous sulfate tablet 325 mg (has no administration in time range)  heparin injection 5,000 Units (has no administration in time range)  0.9 %  sodium chloride infusion (has no administration in time range)  acetaminophen (TYLENOL) tablet 650 mg (has no administration in time range)    Or  acetaminophen (TYLENOL) suppository 650 mg (has no administration in time  range)  docusate sodium (COLACE) capsule 100 mg (has no administration in time range)  ondansetron (ZOFRAN) tablet 4 mg (has no administration in time range)    Or  ondansetron (ZOFRAN) injection 4 mg (has no administration in time range)  vancomycin (VANCOREADY) IVPB 1750 mg/350 mL (has no administration in time range)  ceFEPIme (MAXIPIME) 2 g in sodium chloride 0.9 % 100 mL IVPB (has no administration in time range)  ipratropium-albuterol (DUONEB) 0.5-2.5 (3) MG/3ML nebulizer solution 3 mL (has no administration in time range)  ceFEPIme (MAXIPIME) 2 g in sodium chloride 0.9 % 100 mL IVPB (2 g Intravenous New Bag/Given 03/04/21 1433)    ED Course/ Medical Decision Making/ A&P                           Medical Decision Making  Pt's labs and xrays reviewed.  Pt does have cellulitis that has not responded to outpatient treatment.  Pt given vancomycin and maxipime for IV abx.  Pt d/w Dr. [name] ([facility]) for admission.         Final Clinical Impression(s) / ED Diagnoses Final diagnoses:  Cellulitis of right lower extremity  Cellulitis of left lower extremity  Failure of outpatient treatment  Acute renal failure superimposed on stage 3b chronic kidney disease, unspecified acute renal failure type [facility])    Rx / DC Orders ED Discharge Orders     None         Isla Pence, MD 03/04/21 1523

## 2021-03-05 ENCOUNTER — Inpatient Hospital Stay (HOSPITAL_COMMUNITY): Payer: No Typology Code available for payment source

## 2021-03-05 DIAGNOSIS — N179 Acute kidney failure, unspecified: Secondary | ICD-10-CM

## 2021-03-05 DIAGNOSIS — R21 Rash and other nonspecific skin eruption: Secondary | ICD-10-CM

## 2021-03-05 DIAGNOSIS — F419 Anxiety disorder, unspecified: Secondary | ICD-10-CM

## 2021-03-05 DIAGNOSIS — E039 Hypothyroidism, unspecified: Secondary | ICD-10-CM

## 2021-03-05 HISTORY — DX: Rash and other nonspecific skin eruption: R21

## 2021-03-05 LAB — COMPREHENSIVE METABOLIC PANEL WITH GFR
ALT: 8 U/L (ref 0–44)
AST: 11 U/L — ABNORMAL LOW (ref 15–41)
Albumin: 2.2 g/dL — ABNORMAL LOW (ref 3.5–5.0)
Alkaline Phosphatase: 69 U/L (ref 38–126)
Anion gap: 9 (ref 5–15)
BUN: 19 mg/dL (ref 8–23)
CO2: 22 mmol/L (ref 22–32)
Calcium: 8 mg/dL — ABNORMAL LOW (ref 8.9–10.3)
Chloride: 109 mmol/L (ref 98–111)
Creatinine, Ser: 2.43 mg/dL — ABNORMAL HIGH (ref 0.61–1.24)
GFR, Estimated: 26 mL/min — ABNORMAL LOW
Glucose, Bld: 93 mg/dL (ref 70–99)
Potassium: 4.2 mmol/L (ref 3.5–5.1)
Sodium: 140 mmol/L (ref 135–145)
Total Bilirubin: 0.7 mg/dL (ref 0.3–1.2)
Total Protein: 5.9 g/dL — ABNORMAL LOW (ref 6.5–8.1)

## 2021-03-05 LAB — URINALYSIS, COMPLETE (UACMP) WITH MICROSCOPIC
Bacteria, UA: NONE SEEN
Bilirubin Urine: NEGATIVE
Glucose, UA: 50 mg/dL — AB
Hgb urine dipstick: NEGATIVE
Ketones, ur: NEGATIVE mg/dL
Leukocytes,Ua: NEGATIVE
Nitrite: NEGATIVE
Protein, ur: >=300 mg/dL — AB
Specific Gravity, Urine: 1.012 (ref 1.005–1.030)
pH: 5 (ref 5.0–8.0)

## 2021-03-05 LAB — CBC
HCT: 31.9 % — ABNORMAL LOW (ref 39.0–52.0)
Hemoglobin: 10.1 g/dL — ABNORMAL LOW (ref 13.0–17.0)
MCH: 29.6 pg (ref 26.0–34.0)
MCHC: 31.7 g/dL (ref 30.0–36.0)
MCV: 93.5 fL (ref 80.0–100.0)
Platelets: 366 K/uL (ref 150–400)
RBC: 3.41 MIL/uL — ABNORMAL LOW (ref 4.22–5.81)
RDW: 13.9 % (ref 11.5–15.5)
WBC: 8.3 K/uL (ref 4.0–10.5)
nRBC: 0 % (ref 0.0–0.2)

## 2021-03-05 LAB — C-REACTIVE PROTEIN: CRP: 9.3 mg/dL — ABNORMAL HIGH (ref <1.0)

## 2021-03-05 LAB — SEDIMENTATION RATE: Sed Rate: 69 mm/hr — ABNORMAL HIGH (ref 0–16)

## 2021-03-05 LAB — MAGNESIUM: Magnesium: 1.6 mg/dL — ABNORMAL LOW (ref 1.7–2.4)

## 2021-03-05 LAB — PHOSPHORUS: Phosphorus: 3.8 mg/dL (ref 2.5–4.6)

## 2021-03-05 LAB — PROCALCITONIN: Procalcitonin: <0.1 ng/mL

## 2021-03-05 MED ORDER — CEFAZOLIN SODIUM-DEXTROSE 1-4 GM/50ML-% IV SOLN
1.0000 g | Freq: Two times a day (BID) | INTRAVENOUS | Status: DC
  Administered 2021-03-05 – 2021-03-06 (×3): 1 g via INTRAVENOUS
  Filled 2021-03-05 (×3): qty 50

## 2021-03-05 MED ORDER — AQUAPHOR EX OINT
TOPICAL_OINTMENT | Freq: Two times a day (BID) | CUTANEOUS | Status: DC | PRN
  Filled 2021-03-05: qty 50

## 2021-03-05 MED ORDER — PRAVASTATIN SODIUM 20 MG PO TABS
40.0000 mg | ORAL_TABLET | Freq: Every day | ORAL | Status: DC
  Administered 2021-03-05 – 2021-03-10 (×6): 40 mg via ORAL
  Filled 2021-03-05 (×6): qty 2

## 2021-03-05 MED ORDER — ALBUTEROL SULFATE (2.5 MG/3ML) 0.083% IN NEBU: 3.0000 mL | INHALATION_SOLUTION | Freq: Four times a day (QID) | RESPIRATORY_TRACT | Status: DC | PRN

## 2021-03-05 MED ORDER — METOPROLOL TARTRATE 25 MG PO TABS
25.0000 mg | ORAL_TABLET | Freq: Every day | ORAL | Status: DC
  Administered 2021-03-05 – 2021-03-11 (×7): 25 mg via ORAL
  Filled 2021-03-05 (×7): qty 1

## 2021-03-05 MED ORDER — MAGNESIUM OXIDE -MG SUPPLEMENT 400 (240 MG) MG PO TABS
400.0000 mg | ORAL_TABLET | Freq: Two times a day (BID) | ORAL | Status: DC
  Administered 2021-03-05 – 2021-03-11 (×13): 400 mg via ORAL
  Filled 2021-03-05 (×13): qty 1

## 2021-03-05 NOTE — Assessment & Plan Note (Addendum)
Holding enalapril Clonidine, amlodipine, metoprolol Follow outpatient

## 2021-03-05 NOTE — Assessment & Plan Note (Addendum)
Home inhalers Required O2 yesterday with therapy, but O2 requirement today resolved - unclear, transient

## 2021-03-05 NOTE — Hospital Course (Addendum)
Wayne Meadows is Wayne Meadows 86 y.o. male with PMH significant for essential hypertension, CAD with stents, history of CVA, COPD, arthritis, tobacco abuse in remission presented in the ED with complaints of pain, swelling and redness of both lower extremities.  Patient reports that he has been dealing with recurrent cellulitis in both lower extremities for last 2 months.   He was treated with antibiotics and received wound care.  He's gradually improved.  He had generalized rash and was treated with permetherin for concern for scabies.  Hospitalization was complicated by AKI.  He refused labs on 1/17 and requested discharge.  Recommended holding enalapril and following with outpatient nephrologist.

## 2021-03-05 NOTE — Assessment & Plan Note (Addendum)
Bilateral LE cellulitis Sounds like recurrent episodes since Sept 2022? Continue vanc/ceftriaxone - pain improving since 1/12 - discharged on abx Elevated sed rate and CRP ABI wnl I think dermatology follow up with recurrent nature would be reasonable Wound care recs apprecaited

## 2021-03-05 NOTE — Progress Notes (Signed)
PROGRESS NOTE    Wayne Meadows  AOZ:[id number] DOB: 1934/05/08 DOA: 03/04/2021 PCP: [name]  No chief complaint on file.   Brief Narrative:  Wayne Meadows is Wayne Meadows 87 y.o. male with PMH significant for essential hypertension, CAD with stents, history of CVA, COPD, arthritis, tobacco abuse in remission presented in the ED with complaints of pain, swelling and redness of both lower extremities.  Patient reports that he has been dealing with recurrent cellulitis in both lower extremities for last 2 months.      Assessment & Plan:   Principal Problem:   Recurrent cellulitis of lower leg Active Problems:   AKI (acute kidney injury) (HCC)   Rash   Hypertension   CAD (coronary artery disease)   Hypothyroidism   COPD (chronic obstructive pulmonary disease) (HCC)   Anxiety   Overweight (BMI 25.0-29.9)   CKD (chronic kidney disease), stage III (HCC)   * Recurrent cellulitis of lower leg- (present on admission) Bilateral LE cellulitis Sounds like recurrent episodes since Sept 2022? Narrow abx to ancef Elevated sed rate and CRP Follow I think dermatology follow up with recurrent nature would be reasonable   Rash Unclear etiology, flesh colored plaques scattered Occasional pruritus, continue to follow Derm follow outpatient reasonable  AKI (acute kidney injury) ([facility]) baseline creatinine appears to be ~2 2.4 today Renal US without hydro UA pending Hold enalapril Continue IVF   Hypertension- (present on admission) Holding enalapril Clonidine, amlodipine, metoprolol  CAD (coronary artery disease) DAPT Will resume pravastatin  Hypothyroidism synthroid  COPD (chronic obstructive pulmonary disease) ([facility])- (present on admission) Home inhalers  Anxiety ativan   DVT prophylaxis: heparin Code Status: full Family Communication: none at bedside Disposition:   Status is: Inpatient  Remains inpatient appropriate because: need for IV abx    Consultants:  none  Procedures:  none  Antimicrobials:  Anti-infectives (From admission, onward)    Start     Dose/Rate Route Frequency Ordered Stop   03/06/21 0600  vancomycin (VANCOREADY) IVPB 1250 mg/250 mL  Status:  Discontinued        1,250 mg 166.7 mL/hr over 90 Minutes Intravenous Every 36 hours 03/04/21 1717 03/05/21 0706   03/05/21 1200  ceFEPIme (MAXIPIME) 2 g in sodium chloride 0.9 % 100 mL IVPB  Status:  Discontinued        2 g 200 mL/hr over 30 Minutes Intravenous Every 24 hours 03/04/21 1503 03/05/21 0706   03/05/21 0800  ceFAZolin (ANCEF) IVPB 1 g/50 mL premix        1 g 100 mL/hr over 30 Minutes Intravenous Every 12 hours 03/05/21 0706     03/04/21 1530  vancomycin (VANCOREADY) IVPB 1750 mg/350 mL        1,750 mg 175 mL/hr over 120 Minutes Intravenous  Once 03/04/21 1452 03/04/21 1914   03/04/21 1400  vancomycin (VANCOCIN) IVPB 1000 mg/200 mL premix  Status:  Discontinued        1,000 mg 200 mL/hr over 60 Minutes Intravenous  Once 03/04/21 1354 03/04/21 1452   03/04/21 1400  ceFEPIme (MAXIPIME) 2 g in sodium chloride 0.9 % 100 mL IVPB        2 g 200 mL/hr over 30 Minutes Intravenous  Once 03/04/21 1354 03/04/21 1503       Subjective: C/o recurrent cellulitis  Objective: Vitals:   03/05/21 0526 03/05/21 0806 03/05/21 1131 03/05/21 1415  BP: (!) 155/66   (!) 129/58  Pulse: 80   82  Resp: 16  18  Temp: 99.5 F (37.5 C)   98.8 F (37.1 C)  TempSrc: Oral   Oral  SpO2: 91% (!) 89% (!) 88% 91%  Weight:      Height:        Intake/Output Summary (Last 24 hours) at 03/05/2021 1556 Last data filed at 03/05/2021 1400 Gross per 24 hour  Intake 2129.48 ml  Output 0 ml  Net 2129.48 ml   Filed Weights   03/04/21 1213  Weight: 88.5 kg    Examination:  General exam: Appears calm and comfortable  Respiratory system: Clear to auscultation. Respiratory effort normal. Cardiovascular system: RRR Gastrointestinal system: Abdomen is nondistended, soft and  nontender.  Central nervous system: Alert and oriented. No focal neurological deficits. Extremities: bilateral LE with erythema to below knees, flaky skin, ttp, no fluctuance, crepitus, soft compartments, areas of erosion scattered Psychiatry: Judgement and insight appear normal. Mood & affect appropriate.     Data Reviewed: I have personally reviewed following labs and imaging studies  CBC: Recent Labs  Lab 03/04/21 1256 03/04/21 1427 03/05/21 0408  WBC 15.0* 13.1* 8.3  NEUTROABS 12.2*  --   --   HGB 11.9* 10.9* 10.1*  HCT 38.1* 33.5* 31.9*  MCV 93.6 91.3 93.5  PLT 485* 455* 250    Basic Metabolic Panel: Recent Labs  Lab 03/04/21 1256 03/04/21 1427 03/05/21 0408  NA 138  --  140  K 4.7  --  4.2  CL 101  --  109  CO2 24  --  22  GLUCOSE 92  --  93  BUN 19  --  19  CREATININE 2.38* 2.47* 2.43*  CALCIUM 8.5*  --  8.0*  MG  --   --  1.6*  PHOS  --   --  3.8    GFR: Estimated Creatinine Clearance: 26.3 mL/min (Wayne Meadows) (by C-G formula based on SCr of 2.43 mg/dL (H)).  Liver Function Tests: Recent Labs  Lab 03/04/21 1256 03/05/21 0408  AST 14* 11*  ALT 10 8  ALKPHOS 86 69  BILITOT 0.6 0.7  PROT 7.4 5.9*  ALBUMIN 2.8* 2.2*    CBG: No results for input(s): GLUCAP in the last 168 hours.   Recent Results (from the past 240 hour(s))  Resp Panel by RT-PCR (Flu Wayne Meadows&B, Covid) Nasopharyngeal Swab     Status: None   Collection Time: 03/04/21 12:56 PM   Specimen: Nasopharyngeal Swab; Nasopharyngeal(NP) swabs in vial transport medium  Result Value Ref Range Status   SARS Coronavirus 2 by RT PCR NEGATIVE NEGATIVE Final    Comment: (NOTE) SARS-CoV-2 target nucleic acids are NOT DETECTED.  The SARS-CoV-2 RNA is generally detectable in upper respiratory specimens during the acute phase of infection. The lowest concentration of SARS-CoV-2 viral copies this assay can detect is 138 copies/mL. Wayne Meadows negative result does not preclude SARS-Cov-2 infection and should not be used as  the sole basis for treatment or other patient management decisions. Wayne Meadows negative result may occur with  improper specimen collection/handling, submission of specimen other than nasopharyngeal swab, presence of viral mutation(s) within the areas targeted by this assay, and inadequate number of viral copies(<138 copies/mL). Daune Colgate negative result must be combined with clinical observations, patient history, and epidemiological information. The expected result is Negative.  Fact Sheet for Patients:  EntrepreneurPulse.com.au  Fact Sheet for Healthcare Providers:  IncredibleEmployment.be  This test is no t yet approved or cleared by the [place] FDA and  has been authorized for detection and/or diagnosis of SARS-CoV-2 by  FDA under an Emergency Use Authorization (EUA). This EUA will remain  in effect (meaning this test can be used) for the duration of the COVID-19 declaration under Section 564(b)(1) of the Act, 21 U.S.C.section 360bbb-3(b)(1), unless the authorization is terminated  or revoked sooner.       Influenza Daralyn Bert by PCR NEGATIVE NEGATIVE Final   Influenza B by PCR NEGATIVE NEGATIVE Final    Comment: (NOTE) The Xpert Xpress SARS-CoV-2/FLU/RSV plus assay is intended as an aid in the diagnosis of influenza from Nasopharyngeal swab specimens and should not be used as Pascal Stiggers sole basis for treatment. Nasal washings and aspirates are unacceptable for Xpert Xpress SARS-CoV-2/FLU/RSV testing.  Fact Sheet for Patients: EntrepreneurPulse.com.au  Fact Sheet for Healthcare Providers: IncredibleEmployment.be  This test is not yet approved or cleared by the [place] FDA and has been authorized for detection and/or diagnosis of SARS-CoV-2 by FDA under an Emergency Use Authorization (EUA). This EUA will remain in effect (meaning this test can be used) for the duration of the COVID-19 declaration under Section 564(b)(1) of  the Act, 21 U.S.C. section 360bbb-3(b)(1), unless the authorization is terminated or revoked.  Performed at [facility], [place] [address], [place], [place] [address]          Radiology Studies: DG Chest 2 View  Result Date: 03/04/2021 CLINICAL DATA:  Productive cough EXAM: CHEST - 2 VIEW COMPARISON:  January 01, 2021. FINDINGS: Similar right basilar atelectasis. Suspected trace bilateral pleural effusions. No consolidation. No visible pleural effusions or pneumothorax. Incompletely imaged lumbar fusion hardware. IMPRESSION: 1. Similar right basilar atelectasis. 2. Suspected trace bilateral pleural effusions. 3. Chronic hyperinflation. Electronically Signed   By: Margaretha Sheffield M.D.   On: 03/04/2021 13:11   US RENAL  Result Date: 03/05/2021 CLINICAL DATA:  Acute kidney injury, history of left kidney biopsy and partial left nephrectomy. EXAM: RENAL / URINARY TRACT ULTRASOUND COMPLETE COMPARISON:  No prior renal ultrasound, correlation is made with abdomen ultrasound 03/16/2008 and CT abdomen pelvis 08/12/2020 FINDINGS: Right Kidney: Renal measurements: 10.5 x 5.9 x 6.8 cm = volume: 221 mL. Echogenicity is increased. No hydronephrosis visualized. Multiple anechoic lesions are seen, compatible with renal cysts, the largest of which measures up to 4.2 x 4.0 x 3.7 cm. Left Kidney: Renal measurements: 10.2 x 5.9 x 5.5 cm = volume: 174 mL. Echogenicity is increased. No hydronephrosis visualized. Multiple anechoic lesions are seen, compatible with renal cysts, the largest of which measures up to 3.2 x 2.5 x 2.8 cm Bladder: Appears normal for degree of bladder distention. Other: None. IMPRESSION: 1. Increased renal echogenicity bilaterally, compatible with medical renal disease. 2. Multiple anechoic lesions in the bilateral kidneys, compatible with renal cysts. Electronically Signed   By: Merilyn Baba M.D.   On: 03/05/2021 12:41        Scheduled Meds:  amLODipine  10 mg Oral  Daily   aspirin EC  81 mg Oral Daily   cloNIDine  0.1 mg Oral Daily   clopidogrel  75 mg Oral Daily   docusate sodium  100 mg Oral BID   ferrous sulfate  325 mg Oral Q M,W,F   heparin  5,000 Units Subcutaneous Q8H   ipratropium-albuterol  3 mL Nebulization QID   levothyroxine  50 mcg Oral QAC breakfast   magnesium oxide  400 mg Oral BID   metoprolol tartrate  25 mg Oral Daily   pantoprazole  40 mg Oral BID   Continuous Infusions:  sodium chloride 50 mL/hr at 03/05/21 1045  ceFAZolin (ANCEF) IV 1 g (03/05/21 0755)     LOS: 1 day    Time spent: over 30 min    Fayrene Helper, MD [facility]   To contact the attending provider between 7A-7P or the covering provider during after hours 7P-7A, please log into the web site [email or web address] and access using universal [place] password for that web site. If you do not have the password, please call the hospital operator.  03/05/2021, 3:56 PM

## 2021-03-05 NOTE — Progress Notes (Signed)
°  Transition of Care [facility]) Screening Note   Patient Details  Name: Wayne Meadows Date of Birth: 1934-04-29   Transition of Care [facility]) CM/SW Contact:    Lennart Pall, LCSW Phone Number: 03/05/2021, 10:23 AM    Transition of Care Department [facility]) has reviewed patient and no TOC needs have been identified at this time. We will continue to monitor patient advancement through interdisciplinary progression rounds. If new patient transition needs arise, please place a TOC consult.

## 2021-03-05 NOTE — Assessment & Plan Note (Addendum)
Unclear etiology, scattered flesh colored plaques and papules, intermittent pruritus  Occasional pruritus, continue to follow ? Consider scabies - not classic.  S/p permethrin.  Will give another dose in about Myrle Dues week. Derm follow outpatient reasonable

## 2021-03-05 NOTE — Assessment & Plan Note (Signed)
DAPT Will resume pravastatin

## 2021-03-05 NOTE — Plan of Care (Signed)

## 2021-03-05 NOTE — Assessment & Plan Note (Signed)
synthroid °

## 2021-03-05 NOTE — Assessment & Plan Note (Addendum)
baseline creatinine appears to be ~2 Rising today FeNa suggests prerenal, he didn't appear grossly overloaded, but net positive 12 L.  Will check orthostatics (negative), recheck weight (similar to weight in November).  Echo from 1/14 with normal vena cava size with >50% resp variability.  Given O2 requirement on 1/16, considered lasix trial but he refused and has refused labs today.  He wants to follow outpatient. Hold enalapril Renal US without hydro UA with proteinuria (follow UP/C -> 5.6, significant) Continue IVF Denies change in urinary frequency Needs outpatient follow up

## 2021-03-05 NOTE — Assessment & Plan Note (Addendum)
noted 

## 2021-03-05 NOTE — Evaluation (Addendum)
Physical Therapy Evaluation Patient Details Name: Wayne Meadows MRN: [id number] DOB: 03-Jun-1934 Today's Date: 03/05/2021  History of Present Illness  87 y.o. male with PMH significant for essential hypertension, CAD with stents, history of CVA, COPD (not on home O2), arthritis, tobacco abuse in remission presented in the ED with complaints of pain, swelling and redness of both lower extremities. Dx of recurrent cellulitis.  Clinical Impression  Pt admitted with above diagnosis. Pt ambulated 130' with RW without loss of balance, distance limited by 3/4 dyspnea, SpO2 88% on room air walking. Pt reports decrease in overall activity tolerance level 2* pain in feet. He is mobilizing well enough to DC home. Pt currently with functional limitations due to the deficits listed below (see PT Problem List). Pt will benefit from skilled PT to increase their independence and safety with mobility to allow discharge to the venue listed below.          Recommendations for follow up therapy are one component of a multi-disciplinary discharge planning process, led by the attending physician.  Recommendations may be updated based on patient status, additional functional criteria and insurance authorization.  Follow Up Recommendations No PT follow up    Assistance Recommended at Discharge Set up Supervision/Assistance  Patient can return home with the following       Equipment Recommendations None recommended by PT  Recommendations for Other Services       Functional Status Assessment Patient has had a recent decline in their functional status and demonstrates the ability to make significant improvements in function in a reasonable and predictable amount of time.     Precautions / Restrictions Precautions Precautions: Fall Precaution Comments: denies falls in past 6 months Restrictions Weight Bearing Restrictions: No      Mobility  Bed Mobility Overal bed mobility: Modified Independent              General bed mobility comments: HOB up, used rail    Transfers Overall transfer level: Modified independent Equipment used: Rolling walker (2 wheels)                    Ambulation/Gait Ambulation/Gait assistance: Supervision Gait Distance (Feet): 130 Feet Assistive device: Rolling walker (2 wheels) Gait Pattern/deviations: Step-through pattern;Decreased stride length Gait velocity: decr     General Gait Details: steady, no loss of balance, 3/4 dyspnea (h/o COPD, not on home O2), SaO2 88% on RA walking  Stairs            Wheelchair Mobility    Modified Rankin (Stroke Patients Only)       Balance Overall balance assessment: Modified Independent                                           Pertinent Vitals/Pain Pain Assessment: 0-10 Pain Score: 4  Pain Location: B feet with walking Pain Descriptors / Indicators: Shooting Pain Intervention(s): Limited activity within patient's tolerance;Monitored during session;Premedicated before session;Repositioned    Home Living Family/patient expects to be discharged to:: Private residence Living Arrangements: Alone Available Help at Discharge: Family;Available PRN/intermittently Type of Home: House Home Access: Stairs to enter Entrance Stairs-Rails: None Entrance Stairs-Number of Steps: 2   Home Layout: One level Home Equipment: Rollator (4 wheels);Cane - single point Additional Comments: family able to assist prn if needed    Prior Function Prior Level of Function : Independent/Modified Independent;Driving  Mobility Comments: cane or rollator for long distances, no AD in home, denies falls in past 6 months       Hand Dominance        Extremity/Trunk Assessment   Upper Extremity Assessment Upper Extremity Assessment: Overall WFL for tasks assessed    Lower Extremity Assessment Lower Extremity Assessment: Overall WFL for tasks assessed    Cervical / Trunk  Assessment Cervical / Trunk Assessment: Normal  Communication   Communication: No difficulties  Cognition Arousal/Alertness: Awake/alert Behavior During Therapy: WFL for tasks assessed/performed Overall Cognitive Status: Within Functional Limits for tasks assessed                                          General Comments      Exercises     Assessment/Plan    PT Assessment Patient needs continued PT services  PT Problem List Decreased activity tolerance;Cardiopulmonary status limiting activity       PT Treatment Interventions Gait training;Therapeutic exercise;Patient/family education    PT Goals (Current goals can be found in the Care Plan section)  Acute Rehab PT Goals Patient Stated Goal: likes to read PT Goal Formulation: With patient Time For Goal Achievement: 03/19/21 Potential to Achieve Goals: Good    Frequency Min 3X/week     Co-evaluation               AM-PAC PT "6 Clicks" Mobility  Outcome Measure Help needed turning from your back to your side while in a flat bed without using bedrails?: None Help needed moving from lying on your back to sitting on the side of a flat bed without using bedrails?: A Little Help needed moving to and from a bed to a chair (including a wheelchair)?: None Help needed standing up from a chair using your arms (e.g., wheelchair or bedside chair)?: None Help needed to walk in hospital room?: None Help needed climbing 3-5 steps with a railing? : A Little 6 Click Score: 22    End of Session Equipment Utilized During Treatment: Gait belt Activity Tolerance: Treatment limited secondary to medical complications (Comment);Patient limited by pain (dyspnea) Patient left: in chair;with call bell/phone within reach;with chair alarm set Nurse Communication: Mobility status PT Visit Diagnosis: Difficulty in walking, not elsewhere classified (R26.2);Pain Pain - Right/Left: Right Pain - part of body: Ankle and joints  of foot    Time: 7048-8891 PT Time Calculation (min) (ACUTE ONLY): 25 min   Charges:   PT Evaluation $PT Eval Moderate Complexity: 1 Mod PT Treatments $Gait Training: 8-22 mins       Blondell Reveal Kistler PT 03/05/2021  Acute Rehabilitation Services Pager [phone] Office [phone]

## 2021-03-06 ENCOUNTER — Inpatient Hospital Stay (HOSPITAL_COMMUNITY): Payer: No Typology Code available for payment source

## 2021-03-06 DIAGNOSIS — L0390 Cellulitis, unspecified: Secondary | ICD-10-CM

## 2021-03-06 LAB — CBC WITH DIFFERENTIAL/PLATELET
Abs Immature Granulocytes: 0.04 10*3/uL (ref 0.00–0.07)
Basophils Absolute: 0.1 10*3/uL (ref 0.0–0.1)
Basophils Relative: 1 %
Eosinophils Absolute: 0.3 10*3/uL (ref 0.0–0.5)
Eosinophils Relative: 5 %
HCT: 33 % — ABNORMAL LOW (ref 39.0–52.0)
Hemoglobin: 10.6 g/dL — ABNORMAL LOW (ref 13.0–17.0)
Immature Granulocytes: 1 %
Lymphocytes Relative: 17 %
Lymphs Abs: 1.3 10*3/uL (ref 0.7–4.0)
MCH: 29.7 pg (ref 26.0–34.0)
MCHC: 32.1 g/dL (ref 30.0–36.0)
MCV: 92.4 fL (ref 80.0–100.0)
Monocytes Absolute: 0.7 10*3/uL (ref 0.1–1.0)
Monocytes Relative: 9 %
Neutro Abs: 5.1 10*3/uL (ref 1.7–7.7)
Neutrophils Relative %: 67 %
Platelets: 349 10*3/uL (ref 150–400)
RBC: 3.57 MIL/uL — ABNORMAL LOW (ref 4.22–5.81)
RDW: 13.9 % (ref 11.5–15.5)
WBC: 7.5 10*3/uL (ref 4.0–10.5)
nRBC: 0 % (ref 0.0–0.2)

## 2021-03-06 LAB — COMPREHENSIVE METABOLIC PANEL
ALT: 8 U/L (ref 0–44)
AST: 11 U/L — ABNORMAL LOW (ref 15–41)
Albumin: 2.2 g/dL — ABNORMAL LOW (ref 3.5–5.0)
Alkaline Phosphatase: 68 U/L (ref 38–126)
Anion gap: 8 (ref 5–15)
BUN: 16 mg/dL (ref 8–23)
CO2: 21 mmol/L — ABNORMAL LOW (ref 22–32)
Calcium: 8.2 mg/dL — ABNORMAL LOW (ref 8.9–10.3)
Chloride: 112 mmol/L — ABNORMAL HIGH (ref 98–111)
Creatinine, Ser: 2.34 mg/dL — ABNORMAL HIGH (ref 0.61–1.24)
GFR, Estimated: 27 mL/min — ABNORMAL LOW (ref >60)
Glucose, Bld: 93 mg/dL (ref 70–99)
Potassium: 3.7 mmol/L (ref 3.5–5.1)
Sodium: 141 mmol/L (ref 135–145)
Total Bilirubin: 0.4 mg/dL (ref 0.3–1.2)
Total Protein: 6.2 g/dL — ABNORMAL LOW (ref 6.5–8.1)

## 2021-03-06 LAB — PROTEIN / CREATININE RATIO, URINE
Creatinine, Urine: 67.63 mg/dL
Protein Creatinine Ratio: 5.64 mg/mg{Cre} — ABNORMAL HIGH (ref 0.00–0.15)
Total Protein, Urine: 381.35 mg/dL

## 2021-03-06 LAB — PROCALCITONIN: Procalcitonin: <0.1 ng/mL

## 2021-03-06 LAB — MAGNESIUM: Magnesium: 1.5 mg/dL — ABNORMAL LOW (ref 1.7–2.4)

## 2021-03-06 LAB — C-REACTIVE PROTEIN: CRP: 6.6 mg/dL — ABNORMAL HIGH (ref <1.0)

## 2021-03-06 LAB — PHOSPHORUS: Phosphorus: 3.1 mg/dL (ref 2.5–4.6)

## 2021-03-06 MED ORDER — SODIUM CHLORIDE 0.9 % IV SOLN
1.0000 g | INTRAVENOUS | Status: DC
  Administered 2021-03-06 – 2021-03-09 (×4): 1 g via INTRAVENOUS
  Filled 2021-03-06 (×4): qty 10

## 2021-03-06 MED ORDER — HYDROCODONE-ACETAMINOPHEN 5-325 MG PO TABS
1.0000 | ORAL_TABLET | ORAL | Status: DC | PRN
  Administered 2021-03-06 – 2021-03-11 (×11): 1 via ORAL
  Filled 2021-03-06 (×12): qty 1

## 2021-03-06 MED ORDER — PERMETHRIN 5 % EX CREA
TOPICAL_CREAM | Freq: Once | CUTANEOUS | Status: AC
  Filled 2021-03-06: qty 60

## 2021-03-06 MED ORDER — HYDROCERIN EX CREA
TOPICAL_CREAM | Freq: Two times a day (BID) | CUTANEOUS | Status: DC
  Administered 2021-03-08: 1 via TOPICAL
  Filled 2021-03-06: qty 113

## 2021-03-06 MED ORDER — MORPHINE SULFATE (PF) 2 MG/ML IV SOLN: 2.0000 mg | INTRAVENOUS | Status: DC | PRN

## 2021-03-06 MED ORDER — VANCOMYCIN HCL 1500 MG/300ML IV SOLN
1500.0000 mg | INTRAVENOUS | Status: DC
  Administered 2021-03-06: 1500 mg via INTRAVENOUS
  Filled 2021-03-06: qty 300

## 2021-03-06 NOTE — Progress Notes (Signed)
ABI's have been completed. Preliminary results can be found in CV Proc through chart review.   03/06/21 2:43 PM Wayne Meadows RVT

## 2021-03-06 NOTE — Progress Notes (Signed)
Pharmacy Antibiotic Note  Wayne Meadows is a 87 y.o. male with hx recurrent cellulitis who presented to the ED on 03/04/2021 with bilateral LE pain and redness.  He was started on vancomycin and cefepime on admission for cellulitis and this was de-escalated to ancef on 1/11.   Pharmacy has been consulted on 1/12 to resume vancomycin back.  Today, 03/06/2021: - afeb, wbc wnl - scr 2.34 (crcl~27) - last dose of vancomycin (1750 mg IV) given too patient on 1/10 at 1714  Plan: - vancomycin 1500 mg IV q48h starting at 5pm today - ceftriaxone 1 gm IV q24h per MD  __________________________________________  Height: 6\' 2"  (188 cm) Weight: 88.5 kg (195 lb) IBW/kg (Calculated) : 82.2  Temp (24hrs), Avg:98.3 F (36.8 C), Min:97.9 F (36.6 C), Max:98.8 F (37.1 C)  Recent Labs  Lab 03/04/21 1256 03/04/21 1427 03/05/21 0408 03/06/21 0406  WBC 15.0* 13.1* 8.3 7.5  CREATININE 2.38* 2.47* 2.43* 2.34*  LATICACIDVEN 1.8  --   --   --     Estimated Creatinine Clearance: 27.3 mL/min (A) (by C-G formula based on SCr of 2.34 mg/dL (H)).    Allergies  Allergen Reactions   Bee Venom Anaphylaxis    Has epi pen   Influenza Vaccines Other (See Comments)    "Mortally sick for 2 weeks"    Thank you for allowing pharmacy to be a part of this patients care.  Lynelle Doctor 03/06/2021 12:48 PM

## 2021-03-06 NOTE — Progress Notes (Addendum)
PROGRESS NOTE    CARREL LEATHER  TIR:[id number] DOB: [birth year] DOA: 03/04/2021 PCP: [name]  No chief complaint on file.   Brief Narrative:  Wayne Meadows is Wayne Meadows 87 y.o. male with PMH significant for essential hypertension, CAD with stents, history of CVA, COPD, arthritis, tobacco abuse in remission presented in the ED with complaints of pain, swelling and redness of both lower extremities.  Patient reports that he has been dealing with recurrent cellulitis in both lower extremities for last 2 months.      Assessment & Plan:   Principal Problem:   Recurrent cellulitis of lower leg Active Problems:   AKI (acute kidney injury) (HCC)   Rash   Hypertension   CAD (coronary artery disease)   Hypothyroidism   COPD (chronic obstructive pulmonary disease) (HCC)   Anxiety   Overweight (BMI 25.0-29.9)   CKD (chronic kidney disease), stage III (HCC)   * Recurrent cellulitis of lower leg- (present on admission) Bilateral LE cellulitis Sounds like recurrent episodes since Sept 2022? Broaden abx today, worsened RLE pain - no crepitus/fluctuance, will have low threshold for imaging, but think we can hold off now Elevated sed rate and CRP ABI Follow I think dermatology follow up with recurrent nature would be reasonable Wound care recs apprecaited   Rash Unclear etiology, scattered flesh colored plaques and papules, intermittent pruritus  Occasional pruritus, continue to follow ? Consider scabies - not classic, but I think trial of permethrin reasonable Derm follow outpatient reasonable  AKI (acute kidney injury) ([place]) baseline creatinine appears to be ~2 2.47 peak 2.34 now, follow  Renal US without hydro UA with proteinuria (follow UP/C) Hold enalapril Continue IVF   Hypertension- (present on admission) Holding enalapril Clonidine, amlodipine, metoprolol  CAD (coronary artery disease) DAPT Will resume pravastatin  Hypothyroidism synthroid  COPD  (chronic obstructive pulmonary disease) ([place])- (present on admission) Home inhalers  Anxiety ativan   DVT prophylaxis: heparin Code Status: full Family Communication: none at bedside Disposition:   Status is: Inpatient  Remains inpatient appropriate because: need for IV abx   Consultants:  none  Procedures:  none  Antimicrobials:  Anti-infectives (From admission, onward)    Start     Dose/Rate Route Frequency Ordered Stop   03/06/21 1700  vancomycin (VANCOREADY) IVPB 1500 mg/300 mL        1,500 mg 150 mL/hr over 120 Minutes Intravenous Every 48 hours 03/06/21 1257     03/06/21 1600  cefTRIAXone (ROCEPHIN) 1 g in sodium chloride 0.9 % 100 mL IVPB        1 g 200 mL/hr over 30 Minutes Intravenous Every 24 hours 03/06/21 1201 03/13/21 1559   03/06/21 0600  vancomycin (VANCOREADY) IVPB 1250 mg/250 mL  Status:  Discontinued        1,250 mg 166.7 mL/hr over 90 Minutes Intravenous Every 36 hours 03/04/21 1717 03/05/21 0706   03/05/21 1200  ceFEPIme (MAXIPIME) 2 g in sodium chloride 0.9 % 100 mL IVPB  Status:  Discontinued        2 g 200 mL/hr over 30 Minutes Intravenous Every 24 hours 03/04/21 1503 03/05/21 0706   03/05/21 0800  ceFAZolin (ANCEF) IVPB 1 g/50 mL premix  Status:  Discontinued        1 g 100 mL/hr over 30 Minutes Intravenous Every 12 hours 03/05/21 0706 03/06/21 1201   03/04/21 1530  vancomycin (VANCOREADY) IVPB 1750 mg/350 mL        1,750 mg 175 mL/hr over 120 Minutes  Intravenous  Once 03/04/21 1452 03/05/21 2000   03/04/21 1400  vancomycin (VANCOCIN) IVPB 1000 mg/200 mL premix  Status:  Discontinued        1,000 mg 200 mL/hr over 60 Minutes Intravenous  Once 03/04/21 1354 03/04/21 1452   03/04/21 1400  ceFEPIme (MAXIPIME) 2 g in sodium chloride 0.9 % 100 mL IVPB        2 g 200 mL/hr over 30 Minutes Intravenous  Once 03/04/21 1354 03/05/21 1957       Subjective: C/o RLE pain, worse today  Objective: Vitals:   03/06/21 0751 03/06/21 0906 03/06/21 1331  03/06/21 1750  BP:  (!) 170/83 (!) 163/71   Pulse:  97 87   Resp:   18   Temp:  97.9 F (36.6 C) 98.3 F (36.8 C)   TempSrc:  Oral Oral   SpO2: 91% 93% 90% 94%  Weight:      Height:        Intake/Output Summary (Last 24 hours) at 03/06/2021 2016 Last data filed at 03/06/2021 1800 Gross per 24 hour  Intake 1696.96 ml  Output 1650 ml  Net 46.96 ml   Filed Weights   03/04/21 1213  Weight: 88.5 kg    Examination:  General: No acute distress. Cardiovascular: RRR Lungs: unlabored Abdomen: Soft, nontender, nondistended Neurological: Alert and oriented 3. Moves all extremities 4 . Cranial nerves II through XII grossly intact. Skin: bilateral LE erythema with scattered yellowish crust/scale, some erosions at ankle (most on medial right) - scattered excoriated papules and flesh colored plaques on upper extremities, left thigh, and abdomen --- no fluctuance, crepitus, soft compartments. Extremities: No clubbing or cyanosis. No edema.     Data Reviewed: I have personally reviewed following labs and imaging studies  CBC: Recent Labs  Lab 03/04/21 1256 03/04/21 1427 03/05/21 0408 03/06/21 0406  WBC 15.0* 13.1* 8.3 7.5  NEUTROABS 12.2*  --   --  5.1  HGB 11.9* 10.9* 10.1* 10.6*  HCT 38.1* 33.5* 31.9* 33.0*  MCV 93.6 91.3 93.5 92.4  PLT 485* 455* 366 932    Basic Metabolic Panel: Recent Labs  Lab 03/04/21 1256 03/04/21 1427 03/05/21 0408 03/06/21 0406  NA 138  --  140 141  K 4.7  --  4.2 3.7  CL 101  --  109 112*  CO2 24  --  22 21*  GLUCOSE 92  --  93 93  BUN 19  --  19 16  CREATININE 2.38* 2.47* 2.43* 2.34*  CALCIUM 8.5*  --  8.0* 8.2*  MG  --   --  1.6* 1.5*  PHOS  --   --  3.8 3.1    GFR: Estimated Creatinine Clearance: 27.3 mL/min (Kainen Struckman) (by C-G formula based on SCr of 2.34 mg/dL (H)).  Liver Function Tests: Recent Labs  Lab 03/04/21 1256 03/05/21 0408 03/06/21 0406  AST 14* 11* 11*  ALT 10 8 8   ALKPHOS 86 69 68  BILITOT 0.6 0.7 0.4  PROT 7.4  5.9* 6.2*  ALBUMIN 2.8* 2.2* 2.2*    CBG: No results for input(s): GLUCAP in the last 168 hours.   Recent Results (from the past 240 hour(s))  Resp Panel by RT-PCR (Flu Gino Garrabrant&B, Covid) Nasopharyngeal Swab     Status: None   Collection Time: 03/04/21 12:56 PM   Specimen: Nasopharyngeal Swab; Nasopharyngeal(NP) swabs in vial transport medium  Result Value Ref Range Status   SARS Coronavirus 2 by RT PCR NEGATIVE NEGATIVE Final    Comment: (NOTE)  SARS-CoV-2 target nucleic acids are NOT DETECTED.  The SARS-CoV-2 RNA is generally detectable in upper respiratory specimens during the acute phase of infection. The lowest concentration of SARS-CoV-2 viral copies this assay can detect is 138 copies/mL. Khizar Fiorella negative result does not preclude SARS-Cov-2 infection and should not be used as the sole basis for treatment or other patient management decisions. San[place] Hazan negative result may occur with  improper specimen collection/handling, submission of specimen other than nasopharyngeal swab, presence of viral mutation(s) within the areas targeted by this assay, and inadequate number of viral copies(<138 copies/mL). Marsa Matteo negative result must be combined with clinical observations, patient history, and epidemiological information. The expected result is Negative.  Fact Sheet for Patients:  EntrepreneurPulse.com.au  Fact Sheet for Healthcare Providers:  IncredibleEmployment.be  This test is no t yet approved or cleared by the [place] FDA and  has been authorized for detection and/or diagnosis of SARS-CoV-2 by FDA under an Emergency Use Authorization (EUA). This EUA will remain  in effect (meaning this test can be used) for the duration of the COVID-19 declaration under Section 564(b)(1) of the Act, 21 U.S.C.section 360bbb-3(b)(1), unless the authorization is terminated  or revoked sooner.       Influenza Keeana Pieratt by PCR NEGATIVE NEGATIVE Final   Influenza B by PCR NEGATIVE  NEGATIVE Final    Comment: (NOTE) The Xpert Xpress SARS-CoV-2/FLU/RSV plus assay is intended as an aid in the diagnosis of influenza from Nasopharyngeal swab specimens and should not be used as [place] sole basis for treatment. Nasal washings and aspirates are unacceptable for Xpert Xpress SARS-CoV-2/FLU/RSV testing.  Fact Sheet for Patients: EntrepreneurPulse.com.au  Fact Sheet for Healthcare Providers: IncredibleEmployment.be  This test is not yet approved or cleared by the [place] FDA and has been authorized for detection and/or diagnosis of SARS-CoV-2 by FDA under an Emergency Use Authorization (EUA). This EUA will remain in effect (meaning this test can be used) for the duration of the COVID-19 declaration under Section 564(b)(1) of the Act, 21 U.S.C. section 360bbb-3(b)(1), unless the authorization is terminated or revoked.  Performed at [facility], [place] [address], [place], [place] [address]          Radiology Studies: US RENAL  Result Date: 03/05/2021 CLINICAL DATA:  Acute kidney injury, history of left kidney biopsy and partial left nephrectomy. EXAM: RENAL / URINARY TRACT ULTRASOUND COMPLETE COMPARISON:  No prior renal ultrasound, correlation is made with abdomen ultrasound 03/16/2008 and CT abdomen pelvis 08/12/2020 FINDINGS: Right Kidney: Renal measurements: 10.5 x 5.9 x 6.8 cm = volume: 221 mL. Echogenicity is increased. No hydronephrosis visualized. Multiple anechoic lesions are seen, compatible with renal cysts, the largest of which measures up to 4.2 x 4.0 x 3.7 cm. Left Kidney: Renal measurements: 10.2 x 5.9 x 5.5 cm = volume: 174 mL. Echogenicity is increased. No hydronephrosis visualized. Multiple anechoic lesions are seen, compatible with renal cysts, the largest of which measures up to 3.2 x 2.5 x 2.8 cm Bladder: Appears normal for degree of bladder distention. Other: None. IMPRESSION: 1. Increased renal  echogenicity bilaterally, compatible with medical renal disease. 2. Multiple anechoic lesions in the bilateral kidneys, compatible with renal cysts. Electronically Signed   By: Merilyn Baba M.D.   On: 03/05/2021 12:41   VAS [place] ABI WITH/WO TBI  Result Date: 03/06/2021  LOWER EXTREMITY DOPPLER STUDY Patient Name:  TORRES HARDENBROOK  Date of Exam:   03/06/2021 Medical Rec #: [id number]         Accession #:  [id number] Date of Birth: 16-Jun-1934        Patient Gender: M Patient Age:   70 years Exam Location:  [facility] Procedure:      VAS [place] ABI WITH/WO TBI Referring Phys: Abdallah Hern POWELL JR --------------------------------------------------------------------------------  Indications: Cellulitis. High Risk Factors: Hypertension, coronary artery disease.  Comparison Study: No prior studies. Performing Technologist: Carlos Levering RVT  Examination Guidelines: Marius Betts complete evaluation includes at minimum, Doppler waveform signals and systolic blood pressure reading at the level of bilateral brachial, anterior tibial, and posterior tibial arteries, when vessel segments are accessible. Bilateral testing is considered an integral part of Darneshia Demary complete examination. Photoelectric Plethysmograph (PPG) waveforms and toe systolic pressure readings are included as required and additional duplex testing as needed. Limited examinations for reoccurring indications may be performed as noted.  ABI Findings: +---------+------------------+-----+--------+--------+  Right     Rt Pressure (mmHg) Index Waveform Comment   +---------+------------------+-----+--------+--------+  Brachial  173                                         +---------+------------------+-----+--------+--------+  PTA       230                1.26  biphasic           +---------+------------------+-----+--------+--------+  DP        219                1.20  biphasic           +---------+------------------+-----+--------+--------+  Great Toe 85                 0.46                      +---------+------------------+-----+--------+--------+ +---------+------------------+-----+---------+-------+  Left      Lt Pressure (mmHg) Index Waveform  Comment  +---------+------------------+-----+---------+-------+  Brachial  183                      triphasic          +---------+------------------+-----+---------+-------+  PTA       189                1.03  biphasic           +---------+------------------+-----+---------+-------+  DP        186                1.02  biphasic           +---------+------------------+-----+---------+-------+  Great Toe 105                0.57                     +---------+------------------+-----+---------+-------+ +-------+-----------+-----------+------------+------------+  ABI/TBI Today's ABI Today's TBI Previous ABI Previous TBI  +-------+-----------+-----------+------------+------------+  Right   1.26                                               +-------+-----------+-----------+------------+------------+  Left    1.03                                               +-------+-----------+-----------+------------+------------+  Summary: Right: Resting right ankle-brachial index is within normal range. No evidence of significant right lower extremity arterial disease. The right toe-brachial index is abnormal. Left: Resting left ankle-brachial index is within normal range. No evidence of significant left lower extremity arterial disease. The left toe-brachial index is abnormal.  *See table(s) above for measurements and observations.  Electronically signed by Orlie Pollen on 03/06/2021 at 3:57:06 PM.    Final         Scheduled Meds:  amLODipine  10 mg Oral Daily   aspirin EC  81 mg Oral Daily   cloNIDine  0.1 mg Oral Daily   clopidogrel  75 mg Oral Daily   docusate sodium  100 mg Oral BID   ferrous sulfate  325 mg Oral Q M,W,F   heparin  5,000 Units Subcutaneous Q8H   hydrocerin   Topical BID   ipratropium-albuterol  3 mL Nebulization QID   levothyroxine   50 mcg Oral QAC breakfast   magnesium oxide  400 mg Oral BID   metoprolol tartrate  25 mg Oral Daily   pantoprazole  40 mg Oral BID   permethrin   Topical Once   pravastatin  40 mg Oral QHS   Continuous Infusions:  sodium chloride 50 mL/hr at 03/06/21 1807   cefTRIAXone (ROCEPHIN)  IV 1 g (03/06/21 1607)   vancomycin 1,500 mg (03/06/21 1813)     LOS: 2 days    Time spent: over 62 min    Fayrene Helper, MD [facility]   To contact the attending provider between 7A-7P or the covering provider during after hours 7P-7A, please log into the web site [email or web address] and access using universal [place] password for that web site. If you do not have the password, please call the hospital operator.  03/06/2021, 8:16 PM

## 2021-03-06 NOTE — Consult Note (Signed)
[facility] Nurse Consult Note: Reason for Consult: bilateral erythema, mild edema, partial thickness wounds. See photos uploaded to EMR for further illustration. Wound type:infectious Pressure Injury POA: N/A Wound SFS:ELTR, dry Drainage (amount, consistency, odor) None Periwound:erythema Dressing procedure/placement/frequency: I will provide guidance for Nursing for the topical care of the LEs using a twice daily cleanse with our house skin cleanser that is pH balanced and no rinse. This is to be followed by application of Eucerin cream.  Topical care for open esions will be to cover with Xeroform gauze and top with ABD pads. Securement is to be with Kerlix roll gauze/paper tape.  Feet are to be placed into pressure redistribution heel boots to float heels and correct lateral rotation.  [place] nursing team will not follow, but will remain available to this patient, the nursing and medical teams.  Please re-consult if needed. Thanks, Maudie Flakes, MSN, RN, [place], Arther Abbott  Pager# [phone]

## 2021-03-06 NOTE — Progress Notes (Signed)
Bilateral lower leg wound care performed per orders, pt tolerated well.

## 2021-03-07 ENCOUNTER — Inpatient Hospital Stay (HOSPITAL_COMMUNITY): Payer: No Typology Code available for payment source

## 2021-03-07 DIAGNOSIS — R809 Proteinuria, unspecified: Secondary | ICD-10-CM

## 2021-03-07 LAB — CBC WITH DIFFERENTIAL/PLATELET
Abs Immature Granulocytes: 0.08 10*3/uL — ABNORMAL HIGH (ref 0.00–0.07)
Basophils Absolute: 0.1 10*3/uL (ref 0.0–0.1)
Basophils Relative: 1 %
Eosinophils Absolute: 0.1 10*3/uL (ref 0.0–0.5)
Eosinophils Relative: 1 %
HCT: 36.1 % — ABNORMAL LOW (ref 39.0–52.0)
Hemoglobin: 11.7 g/dL — ABNORMAL LOW (ref 13.0–17.0)
Immature Granulocytes: 1 %
Lymphocytes Relative: 9 %
Lymphs Abs: 1 10*3/uL (ref 0.7–4.0)
MCH: 29.6 pg (ref 26.0–34.0)
MCHC: 32.4 g/dL (ref 30.0–36.0)
MCV: 91.4 fL (ref 80.0–100.0)
Monocytes Absolute: 0.6 10*3/uL (ref 0.1–1.0)
Monocytes Relative: 6 %
Neutro Abs: 9.6 10*3/uL — ABNORMAL HIGH (ref 1.7–7.7)
Neutrophils Relative %: 82 %
Platelets: 406 10*3/uL — ABNORMAL HIGH (ref 150–400)
RBC: 3.95 MIL/uL — ABNORMAL LOW (ref 4.22–5.81)
RDW: 13.7 % (ref 11.5–15.5)
WBC: 11.4 10*3/uL — ABNORMAL HIGH (ref 4.0–10.5)
nRBC: 0 % (ref 0.0–0.2)

## 2021-03-07 LAB — COMPREHENSIVE METABOLIC PANEL
ALT: 8 U/L (ref 0–44)
AST: 20 U/L (ref 15–41)
Albumin: 2.4 g/dL — ABNORMAL LOW (ref 3.5–5.0)
Alkaline Phosphatase: 77 U/L (ref 38–126)
Anion gap: 13 (ref 5–15)
BUN: 14 mg/dL (ref 8–23)
CO2: 20 mmol/L — ABNORMAL LOW (ref 22–32)
Calcium: 8.7 mg/dL — ABNORMAL LOW (ref 8.9–10.3)
Chloride: 107 mmol/L (ref 98–111)
Creatinine, Ser: 1.86 mg/dL — ABNORMAL HIGH (ref 0.61–1.24)
GFR, Estimated: 35 mL/min — ABNORMAL LOW (ref >60)
Glucose, Bld: 97 mg/dL (ref 70–99)
Potassium: 3.8 mmol/L (ref 3.5–5.1)
Sodium: 140 mmol/L (ref 135–145)
Total Bilirubin: 0.6 mg/dL (ref 0.3–1.2)
Total Protein: 6.7 g/dL (ref 6.5–8.1)

## 2021-03-07 LAB — D-DIMER, QUANTITATIVE: D-Dimer, Quant: 4.21 ug/mL-FEU — ABNORMAL HIGH (ref 0.00–0.50)

## 2021-03-07 LAB — HIV ANTIBODY (ROUTINE TESTING W REFLEX): HIV Screen 4th Generation wRfx: NONREACTIVE

## 2021-03-07 LAB — MAGNESIUM: Magnesium: 1.7 mg/dL (ref 1.7–2.4)

## 2021-03-07 LAB — PHOSPHORUS: Phosphorus: 2.5 mg/dL (ref 2.5–4.6)

## 2021-03-07 LAB — PROCALCITONIN: Procalcitonin: <0.1 ng/mL

## 2021-03-07 LAB — TROPONIN I (HIGH SENSITIVITY)
Troponin I (High Sensitivity): 95 ng/L — ABNORMAL HIGH (ref <18)
Troponin I (High Sensitivity): 121 ng/L (ref <18)

## 2021-03-07 LAB — BRAIN NATRIURETIC PEPTIDE: B Natriuretic Peptide: 931.2 pg/mL — ABNORMAL HIGH (ref 0.0–100.0)

## 2021-03-07 MED ORDER — VANCOMYCIN HCL IN DEXTROSE 1-5 GM/200ML-% IV SOLN
1000.0000 mg | INTRAVENOUS | Status: DC
  Administered 2021-03-07 – 2021-03-08 (×2): 1000 mg via INTRAVENOUS
  Filled 2021-03-07 (×2): qty 200

## 2021-03-07 MED ORDER — CLONAZEPAM 0.5 MG PO TABS: 0.2500 mg | ORAL_TABLET | Freq: Two times a day (BID) | ORAL | Status: DC | PRN

## 2021-03-07 MED ORDER — HEPARIN BOLUS VIA INFUSION
2500.0000 [IU] | Freq: Once | INTRAVENOUS | Status: AC
  Administered 2021-03-07: 2500 [IU] via INTRAVENOUS
  Filled 2021-03-07: qty 2500

## 2021-03-07 MED ORDER — HEPARIN (PORCINE) 25000 UT/250ML-% IV SOLN
1700.0000 [IU]/h | INTRAVENOUS | Status: DC
  Administered 2021-03-07: 1400 [IU]/h via INTRAVENOUS
  Administered 2021-03-08: 1500 [IU]/h via INTRAVENOUS
  Administered 2021-03-09: 1700 [IU]/h via INTRAVENOUS
  Filled 2021-03-07 (×3): qty 250

## 2021-03-07 MED ORDER — HYDRALAZINE HCL 25 MG PO TABS
25.0000 mg | ORAL_TABLET | Freq: Three times a day (TID) | ORAL | Status: DC
  Administered 2021-03-07 – 2021-03-11 (×14): 25 mg via ORAL
  Filled 2021-03-07 (×14): qty 1

## 2021-03-07 MED ORDER — CLONAZEPAM 0.125 MG PO TBDP: 0.2500 mg | ORAL_TABLET | Freq: Two times a day (BID) | ORAL | Status: DC | PRN

## 2021-03-07 MED ORDER — TRAZODONE HCL 50 MG PO TABS
50.0000 mg | ORAL_TABLET | Freq: Every day | ORAL | Status: DC
  Administered 2021-03-07 – 2021-03-10 (×4): 50 mg via ORAL
  Filled 2021-03-07 (×4): qty 1

## 2021-03-07 NOTE — Progress Notes (Signed)
ANTICOAGULATION CONSULT NOTE - Initial Consult  Pharmacy Consult for IV heparin Indication:  suspected PE  Allergies  Allergen Reactions   Bee Venom Anaphylaxis    Has epi pen   Influenza Vaccines Other (See Comments)    "Mortally sick for 2 weeks"    Patient Measurements: Height: 6\' 2"  (188 cm) Weight: 88.5 kg (195 lb) IBW/kg (Calculated) : 82.2 Heparin Dosing Weight: 88.5 kg  Vital Signs: Temp: 98.2 F (36.8 C) (01/13 2022) Temp Source: Oral (01/13 2022) BP: 163/65 (01/13 2022) Pulse Rate: 96 (01/13 2022)  Labs: Recent Labs    03/05/21 0408 03/06/21 0406 03/07/21 0412 03/07/21 1714 03/07/21 1842  HGB 10.1* 10.6* 11.7*  --   --   HCT 31.9* 33.0* 36.1*  --   --   PLT 366 349 406*  --   --   CREATININE 2.43* 2.34* 1.86*  --   --   TROPONINIHS  --   --   --  95* 121*    Estimated Creatinine Clearance: 34.4 mL/min (A) (by C-G formula based on SCr of 1.86 mg/dL (H)).   Medical History: Past Medical History:  Diagnosis Date   Arthritis    Carotid stenosis    COPD (chronic obstructive pulmonary disease) ([place])    Coronary artery disease    S/p PCI 2011;  NSTEMI 12/12:  LHC/PCI 02/23/11: LAD 60% after the septal perforator, D1 occluded with distal collaterals, proximal RI 30-40%, AV circumflex stent patent with 60% stenosis after the stent, RCA 99%, EF 60-65%.  His RCA was treated with a bare-metal stent   CVA (cerebral infarction) 2011   Right cerebral; total obstruction of the right ICA   Diverticulitis    Hypertension    Rectal bleeding 07/2015   Renal carcinoma (HCC)    Stroke (HCC)    Tobacco abuse, in remission     Medications:  Scheduled:   amLODipine  10 mg Oral Daily   aspirin EC  81 mg Oral Daily   cloNIDine  0.1 mg Oral Daily   clopidogrel  75 mg Oral Daily   docusate sodium  100 mg Oral BID   ferrous sulfate  325 mg Oral Q M,W,F   heparin  5,000 Units Subcutaneous Q8H   hydrALAZINE  25 mg Oral Q8H   hydrocerin   Topical BID    ipratropium-albuterol  3 mL Nebulization QID   levothyroxine  50 mcg Oral QAC breakfast   magnesium oxide  400 mg Oral BID   metoprolol tartrate  25 mg Oral Daily   pantoprazole  40 mg Oral BID   permethrin   Topical Once   pravastatin  40 mg Oral QHS   traZODone  50 mg Oral QHS   Infusions:   sodium chloride 50 mL/hr at 03/07/21 1530   cefTRIAXone (ROCEPHIN)  IV 1 g (03/07/21 1545)   vancomycin      Assessment: 87 yo admitted with recurrent cellulitis now with elevated d-dimer and concern for PE to start IV heparin pending VQ scan results  Goal of Therapy:  Heparin level 0.3-0.7 units/ml Monitor platelets by anticoagulation protocol: Yes   Plan:  IV heparin 2500 units bolus then IV heparin 1400 units/hr Check heparin level 8 hours after start of IV heparin Daily CBC  Kara Mead 03/07/2021,8:50 PM

## 2021-03-07 NOTE — Progress Notes (Addendum)
PROGRESS NOTE    Wayne Meadows  AYT:[id number] DOB: 29-Oct-1934 DOA: 03/04/2021 PCP: [name]  No chief complaint on file.   Brief Narrative:  Wayne Meadows is Wayne Meadows 87 y.o. male with PMH significant for essential hypertension, CAD with stents, history of CVA, COPD, arthritis, tobacco abuse in remission presented in the ED with complaints of pain, swelling and redness of both lower extremities.  Patient reports that he has been dealing with recurrent cellulitis in both lower extremities for last 2 months.      Assessment & Plan:   Principal Problem:   Recurrent cellulitis of lower leg Active Problems:   Rash   AKI (acute kidney injury) ([place])   Proteinuria   Hypertension   Pleuritic chest pain   CAD (coronary artery disease)   Hypothyroidism   COPD (chronic obstructive pulmonary disease) (HCC)   Anxiety   Overweight (BMI 25.0-29.9)   CKD (chronic kidney disease), stage III (HCC)   * Recurrent cellulitis of lower leg- (present on admission) Bilateral LE cellulitis Sounds like recurrent episodes since Sept 2022? Continue vanc/ceftriaxone - pain improved since 1/12 Elevated sed rate and CRP ABI wnl I think dermatology follow up with recurrent nature would be reasonable Wound care recs apprecaited   Rash Unclear etiology, scattered flesh colored plaques and papules, intermittent pruritus  Occasional pruritus, continue to follow ? Consider scabies - not classic, but I think trial of permethrin reasonable - will do this tonight Derm follow outpatient reasonable  Proteinuria UP/C 5.6 Follow with renal outpatient Enalapril currently on hold with AKI at presentation, monitor for resumption  AKI (acute kidney injury) ([place]) baseline creatinine appears to be ~2 2.47 peak 1.86, follow - improving Renal US without hydro UA with proteinuria (follow UP/C -> 5.6, significant) Hold enalapril Continue IVF   Pleuritic chest pain Right sided, worse with  cough CXR without acute intrathoracic process EKG appears similar, PVC's, QT prolongation Will follow troponin, BNP Consider CT scan or additional w/u pending progress/findings Addendum -> elevated d dimer, BNP, troponin -> concerning for PE, will start heparin, lower suspicion for ACS based on pleuritic nature and ekg - no CP at this time   Hypertension- (present on admission) Holding enalapril Clonidine, amlodipine, metoprolol Add hydralazine with elevated BP today  CAD (coronary artery disease) DAPT Will resume pravastatin  Hypothyroidism synthroid  COPD (chronic obstructive pulmonary disease) ([place])- (present on admission) Home inhalers  Anxiety Will add clonazepam x2 prn (he wasn't taking ativan on home med list) Trazodone qhs for insomonia   DVT prophylaxis: heparin Code Status: full Family Communication: none at bedside Disposition:   Status is: Inpatient  Remains inpatient appropriate because: need for IV abx   Consultants:  none  Procedures:  none  Antimicrobials:  Anti-infectives (From admission, onward)    Start     Dose/Rate Route Frequency Ordered Stop   03/07/21 2300  vancomycin (VANCOCIN) IVPB 1000 mg/200 mL premix        1,000 mg 200 mL/hr over 60 Minutes Intravenous Every 24 hours 03/07/21 0923     03/06/21 1700  vancomycin (VANCOREADY) IVPB 1500 mg/300 mL  Status:  Discontinued        1,500 mg 150 mL/hr over 120 Minutes Intravenous Every 48 hours 03/06/21 1257 03/07/21 0923   03/06/21 1600  cefTRIAXone (ROCEPHIN) 1 g in sodium chloride 0.9 % 100 mL IVPB        1 g 200 mL/hr over 30 Minutes Intravenous Every 24 hours 03/06/21 1201 03/13/21 1559  03/06/21 0600  vancomycin (VANCOREADY) IVPB 1250 mg/250 mL  Status:  Discontinued        1,250 mg 166.7 mL/hr over 90 Minutes Intravenous Every 36 hours 03/04/21 1717 03/05/21 0706   03/05/21 1200  ceFEPIme (MAXIPIME) 2 g in sodium chloride 0.9 % 100 mL IVPB  Status:  Discontinued        2 g 200  mL/hr over 30 Minutes Intravenous Every 24 hours 03/04/21 1503 03/05/21 0706   03/05/21 0800  ceFAZolin (ANCEF) IVPB 1 g/50 mL premix  Status:  Discontinued        1 g 100 mL/hr over 30 Minutes Intravenous Every 12 hours 03/05/21 0706 03/06/21 1201   03/04/21 1530  vancomycin (VANCOREADY) IVPB 1750 mg/350 mL        1,750 mg 175 mL/hr over 120 Minutes Intravenous  Once 03/04/21 1452 03/05/21 2000   03/04/21 1400  vancomycin (VANCOCIN) IVPB 1000 mg/200 mL premix  Status:  Discontinued        1,000 mg 200 mL/hr over 60 Minutes Intravenous  Once 03/04/21 1354 03/04/21 1452   03/04/21 1400  ceFEPIme (MAXIPIME) 2 g in sodium chloride 0.9 % 100 mL IVPB        2 g 200 mL/hr over 30 Minutes Intravenous  Once 03/04/21 1354 03/05/21 1957       Subjective: C/o right sided pleuritic chest pain  Objective: Vitals:   03/07/21 1111 03/07/21 1343 03/07/21 2011 03/07/21 2022  BP: (!) 165/82 (!) 170/77  (!) 163/65  Pulse: (!) 108 94  96  Resp: 16 16  18   Temp: 98.8 F (37.1 C) 98 F (36.7 C)  98.2 F (36.8 C)  TempSrc: Oral Oral  Oral  SpO2: 91% 94% 94% 96%  Weight:      Height:        Intake/Output Summary (Last 24 hours) at 03/07/2021 2049 Last data filed at 03/07/2021 1800 Gross per 24 hour  Intake 2512.85 ml  Output 200 ml  Net 2312.85 ml   Filed Weights   03/04/21 1213  Weight: 88.5 kg    Examination:  General: No acute distress. Cardiovascular: RRR Lungs: unlabored Abdomen: Soft, nontender, nondistended Neurological: Alert and oriented 3. Moves all extremities 4 . Cranial nerves II through XII grossly intact. Skin: scattered papules and plaques Extremities: bilateral lower extremites with dressings intact    Data Reviewed: I have personally reviewed following labs and imaging studies  CBC: Recent Labs  Lab 03/04/21 1256 03/04/21 1427 03/05/21 0408 03/06/21 0406 03/07/21 0412  WBC 15.0* 13.1* 8.3 7.5 11.4*  NEUTROABS 12.2*  --   --  5.1 9.6*  HGB 11.9* 10.9*  10.1* 10.6* 11.7*  HCT 38.1* 33.5* 31.9* 33.0* 36.1*  MCV 93.6 91.3 93.5 92.4 91.4  PLT 485* 455* 366 349 406*    Basic Metabolic Panel: Recent Labs  Lab 03/04/21 1256 03/04/21 1427 03/05/21 0408 03/06/21 0406 03/07/21 0412  NA 138  --  140 141 140  K 4.7  --  4.2 3.7 3.8  CL 101  --  109 112* 107  CO2 24  --  22 21* 20*  GLUCOSE 92  --  93 93 97  BUN 19  --  19 16 14   CREATININE 2.38* 2.47* 2.43* 2.34* 1.86*  CALCIUM 8.5*  --  8.0* 8.2* 8.7*  MG  --   --  1.6* 1.5* 1.7  PHOS  --   --  3.8 3.1 2.5    GFR: Estimated Creatinine Clearance: 34.4 mL/min (  Madilyne Tadlock) (by C-G formula based on SCr of 1.86 mg/dL (H)).  Liver Function Tests: Recent Labs  Lab 03/04/21 1256 03/05/21 0408 03/06/21 0406 03/07/21 0412  AST 14* 11* 11* 20  ALT 10 8 8 8   ALKPHOS 86 69 68 77  BILITOT 0.6 0.7 0.4 0.6  PROT 7.4 5.9* 6.2* 6.7  ALBUMIN 2.8* 2.2* 2.2* 2.4*    CBG: No results for input(s): GLUCAP in the last 168 hours.   Recent Results (from the past 240 hour(s))  Resp Panel by RT-PCR (Flu Dorotea Hand&B, Covid) Nasopharyngeal Swab     Status: None   Collection Time: 03/04/21 12:56 PM   Specimen: Nasopharyngeal Swab; Nasopharyngeal(NP) swabs in vial transport medium  Result Value Ref Range Status   SARS Coronavirus 2 by RT PCR NEGATIVE NEGATIVE Final    Comment: (NOTE) SARS-CoV-2 target nucleic acids are NOT DETECTED.  The SARS-CoV-2 RNA is generally detectable in upper respiratory specimens during the acute phase of infection. The lowest concentration of SARS-CoV-2 viral copies this assay can detect is 138 copies/mL. Skylene Deremer negative result does not preclude SARS-Cov-2 infection and should not be used as the sole basis for treatment or other patient management decisions. Ilean Spradlin negative result may occur with  improper specimen collection/handling, submission of specimen other than nasopharyngeal swab, presence of viral mutation(s) within the areas targeted by this assay, and inadequate number of  viral copies(<138 copies/mL). Vanessa Alesi negative result must be combined with clinical observations, patient history, and epidemiological information. The expected result is Negative.  Fact Sheet for Patients:  EntrepreneurPulse.com.au  Fact Sheet for Healthcare Providers:  IncredibleEmployment.be  This test is no t yet approved or cleared by the [place] FDA and  has been authorized for detection and/or diagnosis of SARS-CoV-2 by FDA under an Emergency Use Authorization (EUA). This EUA will remain  in effect (meaning this test can be used) for the duration of the COVID-19 declaration under Section 564(b)(1) of the Act, 21 U.S.C.section 360bbb-3(b)(1), unless the authorization is terminated  or revoked sooner.       Influenza Akisha Sturgill by PCR NEGATIVE NEGATIVE Final   Influenza B by PCR NEGATIVE NEGATIVE Final    Comment: (NOTE) The Xpert Xpress SARS-CoV-2/FLU/RSV plus assay is intended as an aid in the diagnosis of influenza from Nasopharyngeal swab specimens and should not be used as Vanna Shavers sole basis for treatment. Nasal washings and aspirates are unacceptable for Xpert Xpress SARS-CoV-2/FLU/RSV testing.  Fact Sheet for Patients: EntrepreneurPulse.com.au  Fact Sheet for Healthcare Providers: IncredibleEmployment.be  This test is not yet approved or cleared by the [place] FDA and has been authorized for detection and/or diagnosis of SARS-CoV-2 by FDA under an Emergency Use Authorization (EUA). This EUA will remain in effect (meaning this test can be used) for the duration of the COVID-19 declaration under Section 564(b)(1) of the Act, 21 U.S.C. section 360bbb-3(b)(1), unless the authorization is terminated or revoked.  Performed at [facility], [place] [address], [place],  [address]          Radiology Studies: DG CHEST PORT 1 VIEW  Result Date: 03/07/2021 CLINICAL DATA:  Cough  EXAM: PORTABLE CHEST 1 VIEW COMPARISON:  03/04/2021 FINDINGS: Single frontal view of the chest demonstrates Neoma Uhrich stable cardiac silhouette. Lungs are hyperinflated with chronic bilateral scarring unchanged. No acute airspace disease, effusion, or pneumothorax. No acute bony abnormality. IMPRESSION: 1. Emphysema, no acute intrathoracic process. Electronically Signed   By: Randa Ngo M.D.   On: 03/07/2021 15:01   VAS [place] ABI  WITH/WO TBI  Result Date: 03/06/2021  LOWER EXTREMITY DOPPLER STUDY Patient Name:  Wayne Meadows  Date of Exam:   03/06/2021 Medical Rec #: [id number]         Accession #:    [id number] Date of Birth: 1934/06/04        Patient Gender: M Patient Age:   5 years Exam Location:  [facility] Procedure:      VAS [place] ABI WITH/WO TBI Referring Phys: Shakiara Lukic POWELL JR --------------------------------------------------------------------------------  Indications: Cellulitis. High Risk Factors: Hypertension, coronary artery disease.  Comparison Study: No prior studies. Performing Technologist: Carlos Levering RVT  Examination Guidelines: Wayne Meadows complete evaluation includes at minimum, Doppler waveform signals and systolic blood pressure reading at the level of bilateral brachial, anterior tibial, and posterior tibial arteries, when vessel segments are accessible. Bilateral testing is considered an integral part of Alexio Sroka complete examination. Photoelectric Plethysmograph (PPG) waveforms and toe systolic pressure readings are included as required and additional duplex testing as needed. Limited examinations for reoccurring indications may be performed as noted.  ABI Findings: +---------+------------------+-----+--------+--------+  Right     Rt Pressure (mmHg) Index Waveform Comment   +---------+------------------+-----+--------+--------+  Brachial  173                                         +---------+------------------+-----+--------+--------+  PTA       230                1.26  biphasic            +---------+------------------+-----+--------+--------+  DP        219                1.20  biphasic           +---------+------------------+-----+--------+--------+  Great Toe 85                 0.46                     +---------+------------------+-----+--------+--------+ +---------+------------------+-----+---------+-------+  Left      Lt Pressure (mmHg) Index Waveform  Comment  +---------+------------------+-----+---------+-------+  Brachial  183                      triphasic          +---------+------------------+-----+---------+-------+  PTA       189                1.03  biphasic           +---------+------------------+-----+---------+-------+  DP        186                1.02  biphasic           +---------+------------------+-----+---------+-------+  Great Toe 105                0.57                     +---------+------------------+-----+---------+-------+ +-------+-----------+-----------+------------+------------+  ABI/TBI Today's ABI Today's TBI Previous ABI Previous TBI  +-------+-----------+-----------+------------+------------+  Right   1.26                                               +-------+-----------+-----------+------------+------------+  Left    1.03                                               +-------+-----------+-----------+------------+------------+  Summary: Right: Resting right ankle-brachial index is within normal range. No evidence of significant right lower extremity arterial disease. The right toe-brachial index is abnormal. Left: Resting left ankle-brachial index is within normal range. No evidence of significant left lower extremity arterial disease. The left toe-brachial index is abnormal.  *See table(s) above for measurements and observations.  Electronically signed by Orlie Pollen on 03/06/2021 at 3:57:06 PM.    Final         Scheduled Meds:  amLODipine  10 mg Oral Daily   aspirin EC  81 mg Oral Daily   cloNIDine  0.1 mg Oral Daily   clopidogrel  75 mg Oral Daily    docusate sodium  100 mg Oral BID   ferrous sulfate  325 mg Oral Q M,W,F   heparin  5,000 Units Subcutaneous Q8H   hydrALAZINE  25 mg Oral Q8H   hydrocerin   Topical BID   ipratropium-albuterol  3 mL Nebulization QID   levothyroxine  50 mcg Oral QAC breakfast   magnesium oxide  400 mg Oral BID   metoprolol tartrate  25 mg Oral Daily   pantoprazole  40 mg Oral BID   permethrin   Topical Once   pravastatin  40 mg Oral QHS   traZODone  50 mg Oral QHS   Continuous Infusions:  sodium chloride 50 mL/hr at 03/07/21 1530   cefTRIAXone (ROCEPHIN)  IV 1 g (03/07/21 1545)   vancomycin       LOS: 3 days    Time spent: over 30 min    Fayrene Helper, MD [facility]   To contact the attending provider between 7A-7P or the covering provider during after hours 7P-7A, please log into the web site [email or web address] and access using universal [place] password for that web site. If you do not have the password, please call the hospital operator.  03/07/2021, 8:49 PM

## 2021-03-07 NOTE — Progress Notes (Signed)
°   03/07/21 1400  Mobility  Activity Refused mobility   Pt stated he was having pain in his feet, refused mobility.  [place] Specialist Acute Rehab Services Office: [phone]

## 2021-03-07 NOTE — Progress Notes (Signed)
Pharmacy Antibiotic Note  Wayne Meadows is a 87 y.o. male with hx recurrent cellulitis who presented to the ED on 03/04/2021 with bilateral LE pain and redness.  He was started on vancomycin and cefepime on admission for cellulitis and this was de-escalated to ancef on 1/11.  Pharmacy has been consulted on 1/12 to resume vancomycin back.  Today, 03/07/2021: - day #4 abx - afeb, wbc up 11.4 - hx CKD; scr trending down with 1.86 today (crcl~34)  Plan: - adjust vancomycin 1000 mg IV q24h for est AUC 479 - ceftriaxone 1 gm IV q24h per MD  __________________________________________  Height: 6\' 2"  (188 cm) Weight: 88.5 kg (195 lb) IBW/kg (Calculated) : 82.2  Temp (24hrs), Avg:98.1 F (36.7 C), Min:97.7 F (36.5 C), Max:98.3 F (36.8 C)  Recent Labs  Lab 03/04/21 1256 03/04/21 1427 03/05/21 0408 03/06/21 0406 03/07/21 0412  WBC 15.0* 13.1* 8.3 7.5 11.4*  CREATININE 2.38* 2.47* 2.43* 2.34* 1.86*  LATICACIDVEN 1.8  --   --   --   --      Estimated Creatinine Clearance: 34.4 mL/min (A) (by C-G formula based on SCr of 1.86 mg/dL (H)).    Allergies  Allergen Reactions   Bee Venom Anaphylaxis    Has epi pen   Influenza Vaccines Other (See Comments)    "Mortally sick for 2 weeks"    Thank you for allowing pharmacy to be a part of this patients care.  Lynelle Doctor 03/07/2021 9:10 AM

## 2021-03-07 NOTE — Progress Notes (Addendum)
PIV consult: Long 22g placed R upper arm. Marcie, RN made aware of pt sneezing repeatedly and report of new "tickle in his throat". Denied dyspnea. Encouraged him to notify RN if new symptoms developed.

## 2021-03-07 NOTE — Assessment & Plan Note (Signed)
UP/C 5.6 Follow with renal outpatient Enalapril currently on hold with AKI at presentation, monitor for resumption

## 2021-03-07 NOTE — Assessment & Plan Note (Addendum)
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

## 2021-03-08 ENCOUNTER — Inpatient Hospital Stay (HOSPITAL_COMMUNITY): Payer: No Typology Code available for payment source

## 2021-03-08 DIAGNOSIS — R079 Chest pain, unspecified: Secondary | ICD-10-CM

## 2021-03-08 DIAGNOSIS — L0390 Cellulitis, unspecified: Secondary | ICD-10-CM

## 2021-03-08 DIAGNOSIS — I10 Essential (primary) hypertension: Secondary | ICD-10-CM | POA: Diagnosis not present

## 2021-03-08 DIAGNOSIS — R931 Abnormal findings on diagnostic imaging of heart and coronary circulation: Secondary | ICD-10-CM

## 2021-03-08 DIAGNOSIS — R7989 Other specified abnormal findings of blood chemistry: Secondary | ICD-10-CM

## 2021-03-08 HISTORY — DX: Abnormal findings on diagnostic imaging of heart and coronary circulation: R93.1

## 2021-03-08 LAB — CBC WITH DIFFERENTIAL/PLATELET
Abs Immature Granulocytes: 0.07 10*3/uL (ref 0.00–0.07)
Basophils Absolute: 0.1 10*3/uL (ref 0.0–0.1)
Basophils Relative: 1 %
Eosinophils Absolute: 0.3 10*3/uL (ref 0.0–0.5)
Eosinophils Relative: 2 %
HCT: 32.8 % — ABNORMAL LOW (ref 39.0–52.0)
Hemoglobin: 10.3 g/dL — ABNORMAL LOW (ref 13.0–17.0)
Immature Granulocytes: 1 %
Lymphocytes Relative: 11 %
Lymphs Abs: 1.4 10*3/uL (ref 0.7–4.0)
MCH: 29.3 pg (ref 26.0–34.0)
MCHC: 31.4 g/dL (ref 30.0–36.0)
MCV: 93.2 fL (ref 80.0–100.0)
Monocytes Absolute: 0.9 10*3/uL (ref 0.1–1.0)
Monocytes Relative: 8 %
Neutro Abs: 9.2 10*3/uL — ABNORMAL HIGH (ref 1.7–7.7)
Neutrophils Relative %: 77 %
Platelets: 376 10*3/uL (ref 150–400)
RBC: 3.52 MIL/uL — ABNORMAL LOW (ref 4.22–5.81)
RDW: 14.2 % (ref 11.5–15.5)
WBC: 11.9 10*3/uL — ABNORMAL HIGH (ref 4.0–10.5)
nRBC: 0 % (ref 0.0–0.2)

## 2021-03-08 LAB — COMPREHENSIVE METABOLIC PANEL
ALT: 6 U/L (ref 0–44)
AST: 26 U/L (ref 15–41)
Albumin: 2.3 g/dL — ABNORMAL LOW (ref 3.5–5.0)
Alkaline Phosphatase: 70 U/L (ref 38–126)
Anion gap: 8 (ref 5–15)
BUN: 20 mg/dL (ref 8–23)
CO2: 19 mmol/L — ABNORMAL LOW (ref 22–32)
Calcium: 8.3 mg/dL — ABNORMAL LOW (ref 8.9–10.3)
Chloride: 114 mmol/L — ABNORMAL HIGH (ref 98–111)
Creatinine, Ser: 2.18 mg/dL — ABNORMAL HIGH (ref 0.61–1.24)
GFR, Estimated: 29 mL/min — ABNORMAL LOW (ref >60)
Glucose, Bld: 84 mg/dL (ref 70–99)
Potassium: 3.8 mmol/L (ref 3.5–5.1)
Sodium: 141 mmol/L (ref 135–145)
Total Bilirubin: 0.5 mg/dL (ref 0.3–1.2)
Total Protein: 6.2 g/dL — ABNORMAL LOW (ref 6.5–8.1)

## 2021-03-08 LAB — ECHOCARDIOGRAM COMPLETE
AR max vel: 2.59 cm2
AV Area VTI: 2.49 cm2
AV Area mean vel: 2.39 cm2
AV Mean grad: 8 mmHg
AV Peak grad: 12.8 mmHg
Ao pk vel: 1.79 m/s
Area-P 1/2: 2.71 cm2
Height: 74 in
S' Lateral: 3 cm
Weight: 3120 oz

## 2021-03-08 LAB — HEPARIN LEVEL (UNFRACTIONATED)
Heparin Unfractionated: 0.31 IU/mL (ref 0.30–0.70)
Heparin Unfractionated: 0.41 IU/mL (ref 0.30–0.70)
Heparin Unfractionated: 0.43 IU/mL (ref 0.30–0.70)

## 2021-03-08 LAB — BRAIN NATRIURETIC PEPTIDE: B Natriuretic Peptide: 915.5 pg/mL — ABNORMAL HIGH (ref 0.0–100.0)

## 2021-03-08 LAB — MAGNESIUM: Magnesium: 2 mg/dL (ref 1.7–2.4)

## 2021-03-08 LAB — TROPONIN I (HIGH SENSITIVITY): Troponin I (High Sensitivity): 98 ng/L — ABNORMAL HIGH (ref <18)

## 2021-03-08 LAB — PHOSPHORUS: Phosphorus: 3.3 mg/dL (ref 2.5–4.6)

## 2021-03-08 MED ORDER — IPRATROPIUM-ALBUTEROL 0.5-2.5 (3) MG/3ML IN SOLN
3.0000 mL | Freq: Three times a day (TID) | RESPIRATORY_TRACT | Status: DC
  Administered 2021-03-08 – 2021-03-11 (×6): 3 mL via RESPIRATORY_TRACT
  Filled 2021-03-08 (×7): qty 3

## 2021-03-08 NOTE — Assessment & Plan Note (Addendum)
Will discuss with cards mass along atrial septum and recommended follow up? Discussed with cards and ok for outpatient follow up

## 2021-03-08 NOTE — Progress Notes (Signed)
°   03/08/21 1417  Mobility  Activity Refused mobility   Pt stated feet were bothering him. Refused session.   [place] Specialist Acute Rehab Services Office: [phone]

## 2021-03-08 NOTE — Progress Notes (Signed)
Brief Pharmacy Anti-Coagulation Note:  For Full details see note from Gretta Arab PharmD  A/P: Confirmatory heparin level 0.43 on 1500 units/hr Per RN no bleeding Continue heparin drip at 1500 units/hr Daily heparin level and CBC  Dolly Rias RPh 03/08/2021, 10:52 PM

## 2021-03-08 NOTE — Progress Notes (Signed)
ANTICOAGULATION CONSULT NOTE - Follow Up Consult  Pharmacy Consult for Heparin Indication: pulmonary embolus  Allergies  Allergen Reactions   Bee Venom Anaphylaxis    Has epi pen   Influenza Vaccines Other (See Comments)    "Mortally sick for 2 weeks"    Patient Measurements: Height: 6\' 2"  (188 cm) Weight: 88.5 kg (195 lb) IBW/kg (Calculated) : 82.2 Heparin Dosing Weight: TBW  Vital Signs: Temp: 98.2 F (36.8 C) (01/14 1402) Temp Source: Oral (01/14 1402) BP: 166/74 (01/14 1402) Pulse Rate: 91 (01/14 1402)  Labs: Recent Labs    03/06/21 0406 03/07/21 0412 03/07/21 1714 03/07/21 1842 03/08/21 0509  HGB 10.6* 11.7*  --   --  10.3*  HCT 33.0* 36.1*  --   --  32.8*  PLT 349 406*  --   --  376  HEPARINUNFRC  --   --   --   --  0.31  CREATININE 2.34* 1.86*  --   --  2.18*  TROPONINIHS  --   --  95* 121* 98*    Estimated Creatinine Clearance: 29.3 mL/min (A) (by C-G formula based on SCr of 2.18 mg/dL (H)).   Medications:  Infusions:   sodium chloride 50 mL/hr at 03/08/21 1230   cefTRIAXone (ROCEPHIN)  IV Stopped (03/07/21 1900)   heparin 1,500 Units/hr (03/08/21 1402)   vancomycin 1,000 mg (03/07/21 2203)    Assessment: 87 yo admitted with recurrent cellulitis now with elevated d-dimer 4.2 (however, note acute on CKD) and concern for PE to start IV heparin pending VQ scan results.  Today, 03/08/2021: Heparin level 0.41, therapeutic on heparin 1500 units/hr CBC: Hgb down to 10.3 (baseline 10-12) and PLT WNL SCr 2.18 (baseline ~ 1.6-2) VQ scan ordered.   Goal of Therapy:  Heparin level 0.3-0.7 units/ml Monitor platelets by anticoagulation protocol: Yes   Plan:  Continue heparin IV infusion at 1500 units/hr Confirmatory Heparin level in 8 hours Daily heparin level and CBC Follow up VQ scan results when available.     Gretta Arab PharmD, BCPS Clinical Pharmacist [facility] main pharmacy [phone] 03/08/2021 2:47 PM

## 2021-03-08 NOTE — Progress Notes (Signed)
Echocardiogram 2D Echocardiogram has been performed.  Wayne Meadows 03/08/2021, 9:40 AM

## 2021-03-08 NOTE — Progress Notes (Signed)
ANTICOAGULATION CONSULT NOTE - follow up  Pharmacy Consult for IV heparin Indication:  suspected PE  Allergies  Allergen Reactions   Bee Venom Anaphylaxis    Has epi pen   Influenza Vaccines Other (See Comments)    "Mortally sick for 2 weeks"    Patient Measurements: Height: 6\' 2"  (188 cm) Weight: 88.5 kg (195 lb) IBW/kg (Calculated) : 82.2 Heparin Dosing Weight: 88.5 kg  Vital Signs: Temp: 98 F (36.7 C) (01/14 0521) Temp Source: Oral (01/14 0521) BP: 172/66 (01/14 0521) Pulse Rate: 90 (01/14 0521)  Labs: Recent Labs    03/06/21 0406 03/07/21 0412 03/07/21 1714 03/07/21 1842 03/08/21 0509  HGB 10.6* 11.7*  --   --  10.3*  HCT 33.0* 36.1*  --   --  32.8*  PLT 349 406*  --   --  376  HEPARINUNFRC  --   --   --   --  0.31  CREATININE 2.34* 1.86*  --   --   --   TROPONINIHS  --   --  95* 121*  --      Estimated Creatinine Clearance: 34.4 mL/min (A) (by C-G formula based on SCr of 1.86 mg/dL (H)).   Medical History: Past Medical History:  Diagnosis Date   Arthritis    Carotid stenosis    COPD (chronic obstructive pulmonary disease) ([place])    Coronary artery disease    S/p PCI 2011;  NSTEMI 12/12:  LHC/PCI 02/23/11: LAD 60% after the septal perforator, D1 occluded with distal collaterals, proximal RI 30-40%, AV circumflex stent patent with 60% stenosis after the stent, RCA 99%, EF 60-65%.  His RCA was treated with a bare-metal stent   CVA (cerebral infarction) 2011   Right cerebral; total obstruction of the right ICA   Diverticulitis    Hypertension    Rectal bleeding 07/2015   Renal carcinoma ([facility])    Stroke ([facility])    Tobacco abuse, in remission     Medications:  Scheduled:   amLODipine  10 mg Oral Daily   aspirin EC  81 mg Oral Daily   cloNIDine  0.1 mg Oral Daily   clopidogrel  75 mg Oral Daily   docusate sodium  100 mg Oral BID   ferrous sulfate  325 mg Oral Q M,W,F   hydrALAZINE  25 mg Oral Q8H   hydrocerin   Topical BID   ipratropium-albuterol   3 mL Nebulization QID   levothyroxine  50 mcg Oral QAC breakfast   magnesium oxide  400 mg Oral BID   metoprolol tartrate  25 mg Oral Daily   pantoprazole  40 mg Oral BID   permethrin   Topical Once   pravastatin  40 mg Oral QHS   traZODone  50 mg Oral QHS   Infusions:   sodium chloride 50 mL/hr at 03/08/21 0034   cefTRIAXone (ROCEPHIN)  IV Stopped (03/07/21 1900)   heparin 1,400 Units/hr (03/07/21 2201)   vancomycin 1,000 mg (03/07/21 2203)    Assessment: 87 yo admitted with recurrent cellulitis now with elevated d-dimer and concern for PE to start IV heparin pending VQ scan results  03/08/2021 HL 0.31 low end of therapeutic Hgb 10.3, plts WNL Scr improved to 1.86 No bleeding noted   Goal of Therapy:  Heparin level 0.3-0.7 units/ml Monitor platelets by anticoagulation protocol: Yes   Plan:  Increase IV heparin to 1500 units/hr Check heparin level in 8 hours Daily CBC  Dolly Rias RPh 03/08/2021, 6:15 AM

## 2021-03-08 NOTE — Progress Notes (Signed)
PROGRESS NOTE    Wayne Meadows  PIR:[id number] DOB: 1934-12-19 DOA: 03/04/2021 PCP: [name]  No chief complaint on file.   Brief Narrative:  Wayne Meadows is Wayne Meadows 87 y.o. male with PMH significant for essential hypertension, CAD with stents, history of CVA, COPD, arthritis, tobacco abuse in remission presented in the ED with complaints of pain, swelling and redness of both lower extremities.  Patient reports that he has been dealing with recurrent cellulitis in both lower extremities for last 2 months.      Assessment & Plan:   Principal Problem:   Recurrent cellulitis of lower leg Active Problems:   Rash   AKI (acute kidney injury) ([place])   Proteinuria   Hypertension   Pleuritic chest pain   CAD (coronary artery disease)   Hypothyroidism   COPD (chronic obstructive pulmonary disease) (HCC)   Anxiety   Abnormal echocardiogram   Overweight (BMI 25.0-29.9)   CKD (chronic kidney disease), stage III (HCC)   * Recurrent cellulitis of lower leg- (present on admission) Bilateral LE cellulitis Sounds like recurrent episodes since Sept 2022? Continue vanc/ceftriaxone - pain improving since 1/12 Elevated sed rate and CRP ABI wnl I think dermatology follow up with recurrent nature would be reasonable Wound care recs apprecaited   Rash Unclear etiology, scattered flesh colored plaques and papules, intermittent pruritus  Occasional pruritus, continue to follow ? Consider scabies - not classic, but I think trial of permethrin reasonable - will do this tonight (refused last night) Derm follow outpatient reasonable  Proteinuria UP/C 5.6 Follow with renal outpatient Enalapril currently on hold with AKI at presentation, monitor for resumption  AKI (acute kidney injury) ([place]) baseline creatinine appears to be ~2 2.47 peak Fluctuating 2.18 today, follow Renal US without hydro UA with proteinuria (follow UP/C -> 5.6, significant) Hold enalapril Continue  IVF   Pleuritic chest pain Right sided, worse with cough CXR without acute intrathoracic process EKG appears similar, PVC's, QT prolongation Troponin and BNP elevated and d dimer VQ scan pending Echo with EF 65-70%, no RWMA, grade 1 diastolic dysfunction, RVSF normal, mildly elevated PASP, small mass along atrial septum into RA c/f lipoma Continue heparin for possible PE, follow VQ scan   Hypertension- (present on admission) Holding enalapril Clonidine, amlodipine, metoprolol Add hydralazine with elevated BP today  CAD (coronary artery disease) DAPT Will resume pravastatin  Hypothyroidism synthroid  COPD (chronic obstructive pulmonary disease) ([place])- (present on admission) Home inhalers  Abnormal echocardiogram Will discuss with cards mass along atrial septum and recommended follow up?  Anxiety Trazodone qhs for insomonia   DVT prophylaxis: heparin Code Status: full Family Communication: none at bedside Disposition:   Status is: Inpatient  Remains inpatient appropriate because: need for IV abx   Consultants:  none  Procedures:  none  Antimicrobials:  Anti-infectives (From admission, onward)    Start     Dose/Rate Route Frequency Ordered Stop   03/07/21 2300  vancomycin (VANCOCIN) IVPB 1000 mg/200 mL premix        1,000 mg 200 mL/hr over 60 Minutes Intravenous Every 24 hours 03/07/21 0923     03/06/21 1700  vancomycin (VANCOREADY) IVPB 1500 mg/300 mL  Status:  Discontinued        1,500 mg 150 mL/hr over 120 Minutes Intravenous Every 48 hours 03/06/21 1257 03/07/21 0923   03/06/21 1600  cefTRIAXone (ROCEPHIN) 1 g in sodium chloride 0.9 % 100 mL IVPB        1 g 200 mL/hr over 30  Minutes Intravenous Every 24 hours 03/06/21 1201 03/13/21 1559   03/06/21 0600  vancomycin (VANCOREADY) IVPB 1250 mg/250 mL  Status:  Discontinued        1,250 mg 166.7 mL/hr over 90 Minutes Intravenous Every 36 hours 03/04/21 1717 03/05/21 0706   03/05/21 1200  ceFEPIme  (MAXIPIME) 2 g in sodium chloride 0.9 % 100 mL IVPB  Status:  Discontinued        2 g 200 mL/hr over 30 Minutes Intravenous Every 24 hours 03/04/21 1503 03/05/21 0706   03/05/21 0800  ceFAZolin (ANCEF) IVPB 1 g/50 mL premix  Status:  Discontinued        1 g 100 mL/hr over 30 Minutes Intravenous Every 12 hours 03/05/21 0706 03/06/21 1201   03/04/21 1530  vancomycin (VANCOREADY) IVPB 1750 mg/350 mL        1,750 mg 175 mL/hr over 120 Minutes Intravenous  Once 03/04/21 1452 03/05/21 2000   03/04/21 1400  vancomycin (VANCOCIN) IVPB 1000 mg/200 mL premix  Status:  Discontinued        1,000 mg 200 mL/hr over 60 Minutes Intravenous  Once 03/04/21 1354 03/04/21 1452   03/04/21 1400  ceFEPIme (MAXIPIME) 2 g in sodium chloride 0.9 % 100 mL IVPB        2 g 200 mL/hr over 30 Minutes Intravenous  Once 03/04/21 1354 03/05/21 1957       Subjective: C/o right sided pleuritic chest pain  Objective: Vitals:   03/07/21 2022 03/08/21 0521 03/08/21 0753 03/08/21 1134  BP: (!) 163/65 (!) 172/66    Pulse: 96 90    Resp: 18 18    Temp: 98.2 F (36.8 C) 98 F (36.7 C)    TempSrc: Oral Oral    SpO2: 96% 90% 92% 92%  Weight:      Height:        Intake/Output Summary (Last 24 hours) at 03/08/2021 1342 Last data filed at 03/08/2021 1000 Gross per 24 hour  Intake 1680.37 ml  Output 100 ml  Net 1580.37 ml   Filed Weights   03/04/21 1213  Weight: 88.5 kg    Examination:  General: No acute distress. Cardiovascular: RRR Lungs: unlabored Abdomen: Soft, nontender, nondistended Neurological: Alert and oriented 3. Moves all extremities 4 . Cranial nerves II through XII grossly intact. Skin: scattered papules and plaques Extremities: bilateral lower extremites with dressings intact    Data Reviewed: I have personally reviewed following labs and imaging studies  CBC: Recent Labs  Lab 03/04/21 1256 03/04/21 1427 03/05/21 0408 03/06/21 0406 03/07/21 0412 03/08/21 0509  WBC 15.0* 13.1*  8.3 7.5 11.4* 11.9*  NEUTROABS 12.2*  --   --  5.1 9.6* 9.2*  HGB 11.9* 10.9* 10.1* 10.6* 11.7* 10.3*  HCT 38.1* 33.5* 31.9* 33.0* 36.1* 32.8*  MCV 93.6 91.3 93.5 92.4 91.4 93.2  PLT 485* 455* 366 349 406* 956    Basic Metabolic Panel: Recent Labs  Lab 03/04/21 1256 03/04/21 1427 03/05/21 0408 03/06/21 0406 03/07/21 0412 03/08/21 0509  NA 138  --  140 141 140 141  K 4.7  --  4.2 3.7 3.8 3.8  CL 101  --  109 112* 107 114*  CO2 24  --  22 21* 20* 19*  GLUCOSE 92  --  93 93 97 84  BUN 19  --  19 16 14 20   CREATININE 2.38* 2.47* 2.43* 2.34* 1.86* 2.18*  CALCIUM 8.5*  --  8.0* 8.2* 8.7* 8.3*  MG  --   --  1.6* 1.5* 1.7 2.0  PHOS  --   --  3.8 3.1 2.5 3.3    GFR: Estimated Creatinine Clearance: 29.3 mL/min ([place] Wands) (by C-G formula based on SCr of 2.18 mg/dL (H)).  Liver Function Tests: Recent Labs  Lab 03/04/21 1256 03/05/21 0408 03/06/21 0406 03/07/21 0412 03/08/21 0509  AST 14* 11* 11* 20 26  ALT 10 8 8 8 6   ALKPHOS 86 69 68 77 70  BILITOT 0.6 0.7 0.4 0.6 0.5  PROT 7.4 5.9* 6.2* 6.7 6.2*  ALBUMIN 2.8* 2.2* 2.2* 2.4* 2.3*    CBG: No results for input(s): GLUCAP in the last 168 hours.   Recent Results (from the past 240 hour(s))  Resp Panel by RT-PCR (Flu Jerzey Komperda&B, Covid) Nasopharyngeal Swab     Status: None   Collection Time: 03/04/21 12:56 PM   Specimen: Nasopharyngeal Swab; Nasopharyngeal(NP) swabs in vial transport medium  Result Value Ref Range Status   SARS Coronavirus 2 by RT PCR NEGATIVE NEGATIVE Final    Comment: (NOTE) SARS-CoV-2 target nucleic acids are NOT DETECTED.  The SARS-CoV-2 RNA is generally detectable in upper respiratory specimens during the acute phase of infection. The lowest concentration of SARS-CoV-2 viral copies this assay can detect is 138 copies/mL. Zaryia Markel negative result does not preclude SARS-Cov-2 infection and should not be used as the sole basis for treatment or other patient management decisions. Equilla Que negative result may occur with   improper specimen collection/handling, submission of specimen other than nasopharyngeal swab, presence of viral mutation(s) within the areas targeted by this assay, and inadequate number of viral copies(<138 copies/mL). Trayton Szabo negative result must be combined with clinical observations, patient history, and epidemiological information. The expected result is Negative.  Fact Sheet for Patients:  EntrepreneurPulse.com.au  Fact Sheet for Healthcare Providers:  IncredibleEmployment.be  This test is no t yet approved or cleared by the [place] FDA and  has been authorized for detection and/or diagnosis of SARS-CoV-2 by FDA under an Emergency Use Authorization (EUA). This EUA will remain  in effect (meaning this test can be used) for the duration of the COVID-19 declaration under Section 564(b)(1) of the Act, 21 U.S.C.section 360bbb-3(b)(1), unless the authorization is terminated  or revoked sooner.       Influenza Saundra Gin by PCR NEGATIVE NEGATIVE Final   Influenza B by PCR NEGATIVE NEGATIVE Final    Comment: (NOTE) The Xpert Xpress SARS-CoV-2/FLU/RSV plus assay is intended as an aid in the diagnosis of influenza from Nasopharyngeal swab specimens and should not be used as Tyera Hansley sole basis for treatment. Nasal washings and aspirates are unacceptable for Xpert Xpress SARS-CoV-2/FLU/RSV testing.  Fact Sheet for Patients: EntrepreneurPulse.com.au  Fact Sheet for Healthcare Providers: IncredibleEmployment.be  This test is not yet approved or cleared by the [place] FDA and has been authorized for detection and/or diagnosis of SARS-CoV-2 by FDA under an Emergency Use Authorization (EUA). This EUA will remain in effect (meaning this test can be used) for the duration of the COVID-19 declaration under Section 564(b)(1) of the Act, 21 U.S.C. section 360bbb-3(b)(1), unless the authorization is terminated  or revoked.  Performed at [facility], [place] [address] [place], [place] [address]          Radiology Studies: DG CHEST PORT 1 VIEW  Result Date: 03/07/2021 CLINICAL DATA:  Cough EXAM: PORTABLE CHEST 1 VIEW COMPARISON:  03/04/2021 FINDINGS: Single frontal view of the chest demonstrates Janaria Mccammon stable cardiac silhouette. Lungs are hyperinflated with chronic bilateral scarring unchanged. No acute airspace disease,  effusion, or pneumothorax. No acute bony abnormality. IMPRESSION: 1. Emphysema, no acute intrathoracic process. Electronically Signed   By: Randa Ngo M.D.   On: 03/07/2021 15:01   VAS [place] ABI WITH/WO TBI  Result Date: 03/06/2021  LOWER EXTREMITY DOPPLER STUDY Patient Name:  AJ CRUNKLETON  Date of Exam:   03/06/2021 Medical Rec #: [id number]         Accession #:    [id number] Date of Birth: December 17, 1934        Patient Gender: M Patient Age:   78 years Exam Location:  [facility] Procedure:      VAS [place] ABI WITH/WO TBI Referring Phys: Tavyn Kurka POWELL JR --------------------------------------------------------------------------------  Indications: Cellulitis. High Risk Factors: Hypertension, coronary artery disease.  Comparison Study: No prior studies. Performing Technologist: Carlos Levering RVT  Examination Guidelines: Xavian Hardcastle complete evaluation includes at minimum, Doppler waveform signals and systolic blood pressure reading at the level of bilateral brachial, anterior tibial, and posterior tibial arteries, when vessel segments are accessible. Bilateral testing is considered an integral part of Abie Killian complete examination. Photoelectric Plethysmograph (PPG) waveforms and toe systolic pressure readings are included as required and additional duplex testing as needed. Limited examinations for reoccurring indications may be performed as noted.  ABI Findings: +---------+------------------+-----+--------+--------+  Right     Rt Pressure (mmHg) Index Waveform Comment    +---------+------------------+-----+--------+--------+  Brachial  173                                         +---------+------------------+-----+--------+--------+  PTA       230                1.26  biphasic           +---------+------------------+-----+--------+--------+  DP        219                1.20  biphasic           +---------+------------------+-----+--------+--------+  Great Toe 85                 0.46                     +---------+------------------+-----+--------+--------+ +---------+------------------+-----+---------+-------+  Left      Lt Pressure (mmHg) Index Waveform  Comment  +---------+------------------+-----+---------+-------+  Brachial  183                      triphasic          +---------+------------------+-----+---------+-------+  PTA       189                1.03  biphasic           +---------+------------------+-----+---------+-------+  DP        186                1.02  biphasic           +---------+------------------+-----+---------+-------+  Great Toe 105                0.57                     +---------+------------------+-----+---------+-------+ +-------+-----------+-----------+------------+------------+  ABI/TBI Today's ABI Today's TBI Previous ABI Previous TBI  +-------+-----------+-----------+------------+------------+  Right   1.26                                               +-------+-----------+-----------+------------+------------+  Left    1.03                                               +-------+-----------+-----------+------------+------------+  Summary: Right: Resting right ankle-brachial index is within normal range. No evidence of significant right lower extremity arterial disease. The right toe-brachial index is abnormal. Left: Resting left ankle-brachial index is within normal range. No evidence of significant left lower extremity arterial disease. The left toe-brachial index is abnormal.  *See table(s) above for measurements and observations.  Electronically  signed by Orlie Pollen on 03/06/2021 at 3:57:06 PM.    Final    ECHOCARDIOGRAM COMPLETE  Result Date: 03/08/2021    ECHOCARDIOGRAM REPORT   Patient Name:   SIRRON FRANCESCONI Date of Exam: 03/08/2021 Medical Rec #:  [id number]        Height:       74.0 in Accession #:    [id number]       Weight:       195.0 lb Date of Birth:  10-06-1934       BSA:          2.150 m Patient Age:    39 years         BP:           172/66 mmHg Patient Gender: M                HR:           90 bpm. Exam Location:  Inpatient Procedure: 2D Echo Indications:    "Elevated brain natriuretic peptide level"  History:        Patient has no prior history of Echocardiogram examinations.                 CAD, COPD and Stroke; Risk Factors:Hypertension.  Sonographer:    Arlyss Gandy Referring Phys: [place]  1. Left ventricular ejection fraction, by estimation, is 65 to 70%. The left ventricle has hyperdynamic function. The left ventricle has no regional wall motion abnormalities. There is mild concentric left ventricular hypertrophy. Left ventricular diastolic parameters are consistent with Grade I diastolic dysfunction (impaired relaxation).  2. Right ventricular systolic function is normal. The right ventricular size is normal. There is mildly elevated pulmonary artery systolic pressure.  3. Left atrial size was mildly dilated.  4. 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 can't rule out PFO.  5. Right atrial size was mildly dilated.  6. The mitral valve is normal in structure. No evidence of mitral valve regurgitation.  7. The aortic valve was not well visualized. Aortic valve regurgitation is mild to moderate.  8. The inferior vena cava is normal in size with greater than 50% respiratory variability, suggesting right atrial pressure of 3 mmHg. Comparison(s): No prior Echocardiogram. FINDINGS  Left Ventricle: Left ventricular ejection fraction, by estimation, is 65 to 70%. The left  ventricle has hyperdynamic function. The left ventricle has no regional wall motion abnormalities. The left ventricular internal cavity size was normal in size. There is mild concentric left ventricular hypertrophy. Left ventricular diastolic parameters are consistent with Grade I diastolic dysfunction (impaired relaxation). Right Ventricle: The right ventricular size is normal. No increase in right ventricular wall thickness. Right ventricular systolic function is normal. There is mildly elevated pulmonary artery systolic pressure. The tricuspid regurgitant  velocity is 2.96  m/s, and with an assumed right atrial pressure of 3 mmHg, the estimated right ventricular systolic pressure is 88.4 mmHg. Left Atrium: Left atrial size was mildly dilated. Right Atrium: Right atrial size was mildly dilated. Pericardium: There is no evidence of pericardial effusion. Mitral Valve: The mitral valve is normal in structure. No evidence of mitral valve regurgitation. Tricuspid Valve: The tricuspid valve is normal in structure. Tricuspid valve regurgitation is mild. Aortic Valve: The aortic valve was not well visualized. Aortic valve regurgitation is mild to moderate. Aortic valve mean gradient measures 8.0 mmHg. Aortic valve peak gradient measures 12.8 mmHg. Aortic valve area, by VTI measures 2.49 cm. Pulmonic Valve: The pulmonic valve was not well visualized. Aorta: The aortic root and ascending aorta are structurally normal, with no evidence of dilitation. Venous: The inferior vena cava is normal in size with greater than 50% respiratory variability, suggesting right atrial pressure of 3 mmHg. IAS/Shunts: Can't rule out PFO.  LEFT VENTRICLE PLAX 2D LVIDd:         4.40 cm   Diastology LVIDs:         3.00 cm   LV e' medial:    7.51 cm/s LV PW:         1.10 cm   LV E/e' medial:  11.0 LV IVS:        1.20 cm   LV e' lateral:   8.81 cm/s LVOT diam:     2.20 cm   LV E/e' lateral: 9.4 LV SV:         88 LV SV Index:   41 LVOT Area:      3.80 cm  RIGHT VENTRICLE             IVC RV Basal diam:  4.00 cm     IVC diam: 2.30 cm RV S prime:     21.10 cm/s TAPSE (M-mode): 2.3 cm LEFT ATRIUM             Index        RIGHT ATRIUM           Index LA diam:        4.70 cm 2.19 cm/m   RA Area:     19.80 cm LA Vol (A2C):   94.9 ml 44.14 ml/m  RA Volume:   55.60 ml  25.86 ml/m LA Vol (A4C):   68.2 ml 31.72 ml/m LA Biplane Vol: 83.0 ml 38.60 ml/m  AORTIC VALVE AV Area (Vmax):    2.59 cm AV Area (Vmean):   2.39 cm AV Area (VTI):     2.49 cm AV Vmax:           179.00 cm/s AV Vmean:          134.000 cm/s AV VTI:            0.353 m AV Peak Grad:      12.8 mmHg AV Mean Grad:      8.0 mmHg LVOT Vmax:         122.00 cm/s LVOT Vmean:        84.400 cm/s LVOT VTI:          0.231 m LVOT/AV VTI ratio: 0.65  AORTA Ao Root diam: 3.00 cm Ao Asc diam:  2.60 cm MITRAL VALVE               TRICUSPID VALVE MV Area (PHT): 2.71 cm    TR Peak grad:   35.0 mmHg MV Decel Time: 280  msec    TR Vmax:        296.00 cm/s MV E velocity: 82.70 cm/s MV Roth Ress velocity: 97.70 cm/s  SHUNTS MV E/Shayan Bramhall ratio:  0.85        Systemic VTI:  0.23 m                            Systemic Diam: 2.20 cm Phineas Inches Electronically signed by Phineas Inches Signature Date/Time: 03/08/2021/1:31:12 PM    Final    VAS [place] LOWER EXTREMITY VENOUS (DVT)  Result Date: 03/08/2021  Lower Venous DVT Study Patient Name:  ELIE LEPPO  Date of Exam:   03/08/2021 Medical Rec #: [id number]         Accession #:    [id number] Date of Birth: 05-Jul-1934        Patient Gender: M Patient Age:   84 years Exam Location:  [facility] Procedure:      VAS [place] LOWER EXTREMITY VENOUS (DVT) Referring Phys: Kymberlie Brazeau POWELL JR --------------------------------------------------------------------------------  Indications: Elevated d-dimer, cellulitis, suspected PE.  Anticoagulation: Heparin. Limitations: Bilateral bandaging of mid-lower calves. Comparison Study: No prior studies. Performing Technologist: Darlin Coco RDMS, RVT  Examination  Guidelines: Aicha Clingenpeel complete evaluation includes B-mode imaging, spectral Doppler, color Doppler, and power Doppler as needed of all accessible portions of each vessel. Bilateral testing is considered an integral part of Stark Aguinaga complete examination. Limited examinations for reoccurring indications may be performed as noted. The reflux portion of the exam is performed with the patient in reverse Trendelenburg.  +---------+---------------+---------+-----------+----------+-------------------+  RIGHT     Compressibility Phasicity Spontaneity Properties Thrombus Aging       +---------+---------------+---------+-----------+----------+-------------------+  CFV       Full            Yes       Yes                                         +---------+---------------+---------+-----------+----------+-------------------+  SFJ       Full                                                                  +---------+---------------+---------+-----------+----------+-------------------+  FV Prox   Full                                                                  +---------+---------------+---------+-----------+----------+-------------------+  FV Mid    Full                                                                  +---------+---------------+---------+-----------+----------+-------------------+  FV Distal Full                                                                  +---------+---------------+---------+-----------+----------+-------------------+  PFV       Full                                                                  +---------+---------------+---------+-----------+----------+-------------------+  POP       Full            Yes       Yes                                         +---------+---------------+---------+-----------+----------+-------------------+  PTV                                                        Limited views of                                                                 proximal segment                                                                  patent by color      +---------+---------------+---------+-----------+----------+-------------------+  PERO                                                       Limited views of                                                                 proximal segment                                                                 patent by color      +---------+---------------+---------+-----------+----------+-------------------+   +---------+---------------+---------+-----------+----------+-------------------+  LEFT      Compressibility Phasicity Spontaneity Properties Thrombus Aging       +---------+---------------+---------+-----------+----------+-------------------+  CFV       Full            Yes       Yes                                         +---------+---------------+---------+-----------+----------+-------------------+  SFJ       Full                                                                  +---------+---------------+---------+-----------+----------+-------------------+  FV Prox   Full                                                                  +---------+---------------+---------+-----------+----------+-------------------+  FV Mid    Full                                                                  +---------+---------------+---------+-----------+----------+-------------------+  FV Distal Full                                                                  +---------+---------------+---------+-----------+----------+-------------------+  PFV       Full                                                                  +---------+---------------+---------+-----------+----------+-------------------+  POP       Full            Yes       Yes                                         +---------+---------------+---------+-----------+----------+-------------------+  PTV                                                        Limited views of                                                                  proximal segment  patent by color      +---------+---------------+---------+-----------+----------+-------------------+  PERO                                                       Limited views of                                                                 proximal segment                                                                 patent by color      +---------+---------------+---------+-----------+----------+-------------------+     Summary: RIGHT: - There is no evidence of deep vein thrombosis in the lower extremity. However, portions of this examination were limited- see technologist comments above.  - No cystic structure found in the popliteal fossa.  LEFT: - There is no evidence of deep vein thrombosis in the lower extremity. However, portions of this examination were limited- see technologist comments above.  - No cystic structure found in the popliteal fossa.  *See table(s) above for measurements and observations.    Preliminary         Scheduled Meds:  amLODipine  10 mg Oral Daily   aspirin EC  81 mg Oral Daily   cloNIDine  0.1 mg Oral Daily   clopidogrel  75 mg Oral Daily   docusate sodium  100 mg Oral BID   ferrous sulfate  325 mg Oral Q M,W,F   hydrALAZINE  25 mg Oral Q8H   hydrocerin   Topical BID   ipratropium-albuterol  3 mL Nebulization QID   levothyroxine  50 mcg Oral QAC breakfast   magnesium oxide  400 mg Oral BID   metoprolol tartrate  25 mg Oral Daily   pantoprazole  40 mg Oral BID   permethrin   Topical Once   pravastatin  40 mg Oral QHS   traZODone  50 mg Oral QHS   Continuous Infusions:  sodium chloride 50 mL/hr at 03/08/21 1230   cefTRIAXone (ROCEPHIN)  IV Stopped (03/07/21 1900)   heparin 1,500 Units/hr (03/08/21 0651)   vancomycin 1,000 mg (03/07/21 2203)     LOS: 4 days    Time spent: over 30 min    Fayrene Helper, MD [facility]   To contact the attending provider between 7A-7P or the covering provider during after hours 7P-7A, please log into the web site [email or web address] and access using universal [place] password for that web site. If you do not have the password, please call the hospital operator.  03/08/2021, 1:42 PM

## 2021-03-08 NOTE — Progress Notes (Signed)
Lower extremity venous bilateral study completed.  Preliminary results relayed to Florene Glen, MD via secure chat.  See CV Proc for preliminary results report.   Darlin Coco, RDMS, RVT

## 2021-03-09 ENCOUNTER — Inpatient Hospital Stay (HOSPITAL_COMMUNITY): Payer: No Typology Code available for payment source

## 2021-03-09 LAB — COMPREHENSIVE METABOLIC PANEL
ALT: 7 U/L (ref 0–44)
AST: 26 U/L (ref 15–41)
Albumin: 2.5 g/dL — ABNORMAL LOW (ref 3.5–5.0)
Alkaline Phosphatase: 71 U/L (ref 38–126)
Anion gap: 9 (ref 5–15)
BUN: 22 mg/dL (ref 8–23)
CO2: 18 mmol/L — ABNORMAL LOW (ref 22–32)
Calcium: 8.5 mg/dL — ABNORMAL LOW (ref 8.9–10.3)
Chloride: 112 mmol/L — ABNORMAL HIGH (ref 98–111)
Creatinine, Ser: 2.36 mg/dL — ABNORMAL HIGH (ref 0.61–1.24)
GFR, Estimated: 26 mL/min — ABNORMAL LOW (ref >60)
Glucose, Bld: 89 mg/dL (ref 70–99)
Potassium: 3.9 mmol/L (ref 3.5–5.1)
Sodium: 139 mmol/L (ref 135–145)
Total Bilirubin: 0.6 mg/dL (ref 0.3–1.2)
Total Protein: 6.7 g/dL (ref 6.5–8.1)

## 2021-03-09 LAB — HEPARIN LEVEL (UNFRACTIONATED)
Heparin Unfractionated: <0.1 IU/mL — ABNORMAL LOW (ref 0.30–0.70)
Heparin Unfractionated: 0.33 IU/mL (ref 0.30–0.70)

## 2021-03-09 LAB — CBC
HCT: 36.6 % — ABNORMAL LOW (ref 39.0–52.0)
Hemoglobin: 11.4 g/dL — ABNORMAL LOW (ref 13.0–17.0)
MCH: 29.8 pg (ref 26.0–34.0)
MCHC: 31.1 g/dL (ref 30.0–36.0)
MCV: 95.6 fL (ref 80.0–100.0)
Platelets: 347 10*3/uL (ref 150–400)
RBC: 3.83 MIL/uL — ABNORMAL LOW (ref 4.22–5.81)
RDW: 14.5 % (ref 11.5–15.5)
WBC: 11.6 10*3/uL — ABNORMAL HIGH (ref 4.0–10.5)
nRBC: 0 % (ref 0.0–0.2)

## 2021-03-09 LAB — PHOSPHORUS: Phosphorus: 3.5 mg/dL (ref 2.5–4.6)

## 2021-03-09 LAB — MAGNESIUM: Magnesium: 2.1 mg/dL (ref 1.7–2.4)

## 2021-03-09 MED ORDER — VANCOMYCIN HCL 1500 MG/300ML IV SOLN
1500.0000 mg | INTRAVENOUS | Status: DC
  Filled 2021-03-09: qty 300

## 2021-03-09 MED ORDER — HEPARIN BOLUS VIA INFUSION
2000.0000 [IU] | Freq: Once | INTRAVENOUS | Status: AC
  Administered 2021-03-09: 2000 [IU] via INTRAVENOUS
  Filled 2021-03-09: qty 2000

## 2021-03-09 MED ORDER — TECHNETIUM TO 99M ALBUMIN AGGREGATED: 4.4000 | Freq: Once | INTRAVENOUS | Status: DC | PRN

## 2021-03-09 NOTE — Progress Notes (Signed)
PROGRESS NOTE    Wayne Meadows  WUX:[id number] DOB: 02-13-1935 DOA: 03/04/2021 PCP: [name]  No chief complaint on file.   Brief Narrative:  [name] is Victorino Meadows 87 y.o. male with PMH significant for essential hypertension, CAD with stents, history of CVA, COPD, arthritis, tobacco abuse in remission presented in the ED with complaints of pain, swelling and redness of both lower extremities.  Patient reports that he has been dealing with recurrent cellulitis in both lower extremities for last 2 months.      Assessment & Plan:   Principal Problem:   Recurrent cellulitis of lower leg Active Problems:   Rash   Hypertension   Pleuritic chest pain   AKI (acute kidney injury) (HCC)   Proteinuria   CAD (coronary artery disease)   Hypothyroidism   COPD (chronic obstructive pulmonary disease) (HCC)   Anxiety   Abnormal echocardiogram   Overweight (BMI 25.0-29.9)   CKD (chronic kidney disease), stage III (HCC)   * Recurrent cellulitis of lower leg- (present on admission) Bilateral LE cellulitis Sounds like recurrent episodes since Sept 2022? Continue vanc/ceftriaxone - pain improving since 1/12 Elevated sed rate and CRP ABI wnl I think dermatology follow up with recurrent nature would be reasonable Wound care recs apprecaited   Rash Unclear etiology, scattered flesh colored plaques and papules, intermittent pruritus  Occasional pruritus, continue to follow ? Consider scabies - not classic.  S/p permethrin.  Will give another dose in about Camaria Gerald week. Derm follow outpatient reasonable  Proteinuria UP/C 5.6 Follow with renal outpatient Enalapril currently on hold with AKI at presentation, monitor for resumption  AKI (acute kidney injury) ([place]) baseline creatinine appears to be ~2 2.36 peak Fluctuating 2.18 today, follow Renal US without hydro UA with proteinuria (follow UP/C -> 5.6, significant) Hold enalapril Continue IVF   Pleuritic chest  pain Right sided, worse with cough EKG appears similar, PVC's, QT prolongation Troponin and BNP elevated and d dimer VQ scan without evidence of PE  CXR with emphysema 1/15 Echo with EF 65-70%, no RWMA, grade 1 diastolic dysfunction, RVSF normal, mildly elevated PASP, small mass along atrial septum into RA c/f lipoma Stop heparin   Hypertension- (present on admission) Holding enalapril Clonidine, amlodipine, metoprolol Add hydralazine with elevated BP today  CAD (coronary artery disease) DAPT Will resume pravastatin  Hypothyroidism synthroid  COPD (chronic obstructive pulmonary disease) ([place])- (present on admission) Home inhalers  Abnormal echocardiogram Will discuss with cards mass along atrial septum and recommended follow up? Discussed with cards and ok for outpatient follow up  Anxiety Trazodone qhs for insomonia   DVT prophylaxis: heparin Code Status: full Family Communication: none at bedside Disposition:   Status is: Inpatient  Remains inpatient appropriate because: need for IV abx   Consultants:  none  Procedures:  none  Antimicrobials:  Anti-infectives (From admission, onward)    Start     Dose/Rate Route Frequency Ordered Stop   03/10/21 1000  vancomycin (VANCOREADY) IVPB 1500 mg/300 mL        1,500 mg 150 mL/hr over 120 Minutes Intravenous Every 48 hours 03/09/21 0851     03/07/21 2300  vancomycin (VANCOCIN) IVPB 1000 mg/200 mL premix  Status:  Discontinued        1,000 mg 200 mL/hr over 60 Minutes Intravenous Every 24 hours 03/07/21 0923 03/09/21 0851   03/06/21 1700  vancomycin (VANCOREADY) IVPB 1500 mg/300 mL  Status:  Discontinued        1,500 mg 150 mL/hr over  120 Minutes Intravenous Every 48 hours 03/06/21 1257 03/07/21 0923   03/06/21 1600  cefTRIAXone (ROCEPHIN) 1 g in sodium chloride 0.9 % 100 mL IVPB        1 g 200 mL/hr over 30 Minutes Intravenous Every 24 hours 03/06/21 1201 03/13/21 1559   03/06/21 0600  vancomycin (VANCOREADY)  IVPB 1250 mg/250 mL  Status:  Discontinued        1,250 mg 166.7 mL/hr over 90 Minutes Intravenous Every 36 hours 03/04/21 1717 03/05/21 0706   03/05/21 1200  ceFEPIme (MAXIPIME) 2 g in sodium chloride 0.9 % 100 mL IVPB  Status:  Discontinued        2 g 200 mL/hr over 30 Minutes Intravenous Every 24 hours 03/04/21 1503 03/05/21 0706   03/05/21 0800  ceFAZolin (ANCEF) IVPB 1 g/50 mL premix  Status:  Discontinued        1 g 100 mL/hr over 30 Minutes Intravenous Every 12 hours 03/05/21 0706 03/06/21 1201   03/04/21 1530  vancomycin (VANCOREADY) IVPB 1750 mg/350 mL        1,750 mg 175 mL/hr over 120 Minutes Intravenous  Once 03/04/21 1452 03/05/21 2000   03/04/21 1400  vancomycin (VANCOCIN) IVPB 1000 mg/200 mL premix  Status:  Discontinued        1,000 mg 200 mL/hr over 60 Minutes Intravenous  Once 03/04/21 1354 03/04/21 1452   03/04/21 1400  ceFEPIme (MAXIPIME) 2 g in sodium chloride 0.9 % 100 mL IVPB        2 g 200 mL/hr over 30 Minutes Intravenous  Once 03/04/21 1354 03/05/21 1957       Subjective: Feeling ok   Objective: Vitals:   03/09/21 0447 03/09/21 0803 03/09/21 1413 03/09/21 1743  BP: (!) 172/76  (!) 155/65   Pulse: 96  91   Resp: 18  18   Temp: 97.9 F (36.6 C)  97.9 F (36.6 C)   TempSrc: Oral  Oral   SpO2: 92% 93% 94% 91%  Weight:      Height:        Intake/Output Summary (Last 24 hours) at 03/09/2021 1759 Last data filed at 03/09/2021 1633 Gross per 24 hour  Intake 1744.86 ml  Output 0 ml  Net 1744.86 ml   Filed Weights   03/04/21 1213  Weight: 88.5 kg    Examination:  General: No acute distress. Cardiovascular: RRR Lungs: unlabored Abdomen: Soft, nontender, nondistended  Neurological: Alert and oriented 3. Moves all extremities 4. Cranial nerves II through XII grossly intact. Skin: Warm and dry. No rashes or lesions. Extremities: improved erythema, flaking - improved pain to LE's     Data Reviewed: I have personally reviewed following labs  and imaging studies  CBC: Recent Labs  Lab 03/04/21 1256 03/04/21 1427 03/05/21 0408 03/06/21 0406 03/07/21 0412 03/08/21 0509 03/09/21 0416  WBC 15.0*   < > 8.3 7.5 11.4* 11.9* 11.6*  NEUTROABS 12.2*  --   --  5.1 9.6* 9.2*  --   HGB 11.9*   < > 10.1* 10.6* 11.7* 10.3* 11.4*  HCT 38.1*   < > 31.9* 33.0* 36.1* 32.8* 36.6*  MCV 93.6   < > 93.5 92.4 91.4 93.2 95.6  PLT 485*   < > 366 349 406* 376 347   < > = values in this interval not displayed.    Basic Metabolic Panel: Recent Labs  Lab 03/05/21 0408 03/06/21 0406 03/07/21 0412 03/08/21 0509 03/09/21 0416  NA 140 141 140 141 139  K 4.2 3.7 3.8 3.8 3.9  CL 109 112* 107 114* 112*  CO2 22 21* 20* 19* 18*  GLUCOSE 93 93 97 84 89  BUN 19 16 14 20 22   CREATININE 2.43* 2.34* 1.86* 2.18* 2.36*  CALCIUM 8.0* 8.2* 8.7* 8.3* 8.5*  MG 1.6* 1.5* 1.7 2.0 2.1  PHOS 3.8 3.1 2.5 3.3 3.5    GFR: Estimated Creatinine Clearance: 27.1 mL/min (Jill Ruppe) (by C-G formula based on SCr of 2.36 mg/dL (H)).  Liver Function Tests: Recent Labs  Lab 03/05/21 0408 03/06/21 0406 03/07/21 0412 03/08/21 0509 03/09/21 0416  AST 11* 11* 20 26 26   ALT 8 8 8 6 7   ALKPHOS 69 68 77 70 71  BILITOT 0.7 0.4 0.6 0.5 0.6  PROT 5.9* 6.2* 6.7 6.2* 6.7  ALBUMIN 2.2* 2.2* 2.4* 2.3* 2.5*    CBG: No results for input(s): GLUCAP in the last 168 hours.   Recent Results (from the past 240 hour(s))  Resp Panel by RT-PCR (Flu Terri Rorrer&B, Covid) Nasopharyngeal Swab     Status: None   Collection Time: 03/04/21 12:56 PM   Specimen: Nasopharyngeal Swab; Nasopharyngeal(NP) swabs in vial transport medium  Result Value Ref Range Status   SARS Coronavirus 2 by RT PCR NEGATIVE NEGATIVE Final    Comment: (NOTE) SARS-CoV-2 target nucleic acids are NOT DETECTED.  The SARS-CoV-2 RNA is generally detectable in upper respiratory specimens during the acute phase of infection. The lowest concentration of SARS-CoV-2 viral copies this assay can detect is 138 copies/mL. Zedekiah Hinderman negative  result does not preclude SARS-Cov-2 infection and should not be used as the sole basis for treatment or other patient management decisions. Quandarius Nill negative result may occur with  improper specimen collection/handling, submission of specimen other than nasopharyngeal swab, presence of viral mutation(s) within the areas targeted by this assay, and inadequate number of viral copies(<138 copies/mL). Jynesis Nakamura negative result must be combined with clinical observations, patient history, and epidemiological information. The expected result is Negative.  Fact Sheet for Patients:  EntrepreneurPulse.com.au  Fact Sheet for Healthcare Providers:  IncredibleEmployment.be  This test is no t yet approved or cleared by the [place] FDA and  has been authorized for detection and/or diagnosis of SARS-CoV-2 by FDA under an Emergency Use Authorization (EUA). This EUA will remain  in effect (meaning this test can be used) for the duration of the COVID-19 declaration under Section 564(b)(1) of the Act, 21 U.S.C.section 360bbb-3(b)(1), unless the authorization is terminated  or revoked sooner.       Influenza Muneeb Veras by PCR NEGATIVE NEGATIVE Final   Influenza B by PCR NEGATIVE NEGATIVE Final    Comment: (NOTE) The Xpert Xpress SARS-CoV-2/FLU/RSV plus assay is intended as an aid in the diagnosis of influenza from Nasopharyngeal swab specimens and should not be used as Dreana Britz sole basis for treatment. Nasal washings and aspirates are unacceptable for Xpert Xpress SARS-CoV-2/FLU/RSV testing.  Fact Sheet for Patients: EntrepreneurPulse.com.au  Fact Sheet for Healthcare Providers: IncredibleEmployment.be  This test is not yet approved or cleared by the [place] FDA and has been authorized for detection and/or diagnosis of SARS-CoV-2 by FDA under an Emergency Use Authorization (EUA). This EUA will remain in effect (meaning this test can be used)  for the duration of the COVID-19 declaration under Section 564(b)(1) of the Act, 21 U.S.C. section 360bbb-3(b)(1), unless the authorization is terminated or revoked.  Performed at [facility], [place] [address], [place], [place] [address]          Radiology  Studies: NM Pulmonary Perfusion  Result Date: 03/09/2021 CLINICAL DATA:  Positive D-dimer.  History of COPD. EXAM: NUCLEAR MEDICINE PERFUSION LUNG SCAN TECHNIQUE: Perfusion images were obtained in multiple projections after intravenous injection of radiopharmaceutical. Ventilation scans intentionally deferred if perfusion scan and chest x-ray adequate for interpretation during COVID 19 epidemic. RADIOPHARMACEUTICALS:  4.4 mCi Tc-61m MAA IV COMPARISON:  03/07/2021.  Current chest radiograph is pending. FINDINGS: There are no segmental perfusion defects to suggest pulmonary thromboembolism. Nonsegmental heterogeneous areas of relative decreased perfusion are noted, mostly in the upper lobes, consistent with emphysema. IMPRESSION: No evidence of Myha Arizpe pulmonary embolism. Electronically Signed   By: Lajean Manes M.D.   On: 03/09/2021 14:42   DG CHEST PORT 1 VIEW  Result Date: 03/09/2021 CLINICAL DATA:  Cough EXAM: PORTABLE CHEST 1 VIEW COMPARISON:  03/07/2021 FINDINGS: Two frontal views of the chest demonstrate Sione Baumgarten stable cardiac silhouette. The lungs are hyperinflated with background interstitial prominence consistent with emphysema. No acute airspace disease, effusion, or pneumothorax. No acute bony abnormalities. IMPRESSION: 1. Stable emphysema.  No acute process. Electronically Signed   By: Randa Ngo M.D.   On: 03/09/2021 16:06   ECHOCARDIOGRAM COMPLETE  Result Date: 03/08/2021    ECHOCARDIOGRAM REPORT   Patient Name:   SHAKAI DOLLEY Date of Exam: 03/08/2021 Medical Rec #:  [id number]        Height:       74.0 in Accession #:    [id number]       Weight:       195.0 lb Date of Birth:  1935/02/17       BSA:          2.150  m Patient Age:    68 years         BP:           172/66 mmHg Patient Gender: M                HR:           90 bpm. Exam Location:  Inpatient Procedure: 2D Echo Indications:    "Elevated brain natriuretic peptide level"  History:        Patient has no prior history of Echocardiogram examinations.                 CAD, COPD and Stroke; Risk Factors:Hypertension.  Sonographer:    Arlyss Gandy Referring Phys: [place]  1. Left ventricular ejection fraction, by estimation, is 65 to 70%. The left ventricle has hyperdynamic function. The left ventricle has no regional wall motion abnormalities. There is mild concentric left ventricular hypertrophy. Left ventricular diastolic parameters are consistent with Grade I diastolic dysfunction (impaired relaxation).  2. Right ventricular systolic function is normal. The right ventricular size is normal. There is mildly elevated pulmonary artery systolic pressure.  3. Left atrial size was mildly dilated.  4. 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 can't rule out PFO.  5. Right atrial size was mildly dilated.  6. The mitral valve is normal in structure. No evidence of mitral valve regurgitation.  7. The aortic valve was not well visualized. Aortic valve regurgitation is mild to moderate.  8. The inferior vena cava is normal in size with greater than 50% respiratory variability, suggesting right atrial pressure of 3 mmHg. Comparison(s): No prior Echocardiogram. FINDINGS  Left Ventricle: Left ventricular ejection fraction, by estimation, is 65 to 70%. The left ventricle has hyperdynamic function. The  left ventricle has no regional wall motion abnormalities. The left ventricular internal cavity size was normal in size. There is mild concentric left ventricular hypertrophy. Left ventricular diastolic parameters are consistent with Grade I diastolic dysfunction (impaired relaxation). Right Ventricle: The right ventricular size  is normal. No increase in right ventricular wall thickness. Right ventricular systolic function is normal. There is mildly elevated pulmonary artery systolic pressure. The tricuspid regurgitant velocity is 2.96  m/s, and with an assumed right atrial pressure of 3 mmHg, the estimated right ventricular systolic pressure is 09.9 mmHg. Left Atrium: Left atrial size was mildly dilated. Right Atrium: Right atrial size was mildly dilated. Pericardium: There is no evidence of pericardial effusion. Mitral Valve: The mitral valve is normal in structure. No evidence of mitral valve regurgitation. Tricuspid Valve: The tricuspid valve is normal in structure. Tricuspid valve regurgitation is mild. Aortic Valve: The aortic valve was not well visualized. Aortic valve regurgitation is mild to moderate. Aortic valve mean gradient measures 8.0 mmHg. Aortic valve peak gradient measures 12.8 mmHg. Aortic valve area, by VTI measures 2.49 cm. Pulmonic Valve: The pulmonic valve was not well visualized. Aorta: The aortic root and ascending aorta are structurally normal, with no evidence of dilitation. Venous: The inferior vena cava is normal in size with greater than 50% respiratory variability, suggesting right atrial pressure of 3 mmHg. IAS/Shunts: Can't rule out PFO.  LEFT VENTRICLE PLAX 2D LVIDd:         4.40 cm   Diastology LVIDs:         3.00 cm   LV e' medial:    7.51 cm/s LV PW:         1.10 cm   LV E/e' medial:  11.0 LV IVS:        1.20 cm   LV e' lateral:   8.81 cm/s LVOT diam:     2.20 cm   LV E/e' lateral: 9.4 LV SV:         88 LV SV Index:   41 LVOT Area:     3.80 cm  RIGHT VENTRICLE             IVC RV Basal diam:  4.00 cm     IVC diam: 2.30 cm RV S prime:     21.10 cm/s TAPSE (M-mode): 2.3 cm LEFT ATRIUM             Index        RIGHT ATRIUM           Index LA diam:        4.70 cm 2.19 cm/m   RA Area:     19.80 cm LA Vol (A2C):   94.9 ml 44.14 ml/m  RA Volume:   55.60 ml  25.86 ml/m LA Vol (A4C):   68.2 ml 31.72 ml/m LA  Biplane Vol: 83.0 ml 38.60 ml/m  AORTIC VALVE AV Area (Vmax):    2.59 cm AV Area (Vmean):   2.39 cm AV Area (VTI):     2.49 cm AV Vmax:           179.00 cm/s AV Vmean:          134.000 cm/s AV VTI:            0.353 m AV Peak Grad:      12.8 mmHg AV Mean Grad:      8.0 mmHg LVOT Vmax:         122.00 cm/s LVOT Vmean:  84.400 cm/s LVOT VTI:          0.231 m LVOT/AV VTI ratio: 0.65  AORTA Ao Root diam: 3.00 cm Ao Asc diam:  2.60 cm MITRAL VALVE               TRICUSPID VALVE MV Area (PHT): 2.71 cm    TR Peak grad:   35.0 mmHg MV Decel Time: 280 msec    TR Vmax:        296.00 cm/s MV E velocity: 82.70 cm/s MV Sanam Marmo velocity: 97.70 cm/s  SHUNTS MV E/Ruberta Holck ratio:  0.85        Systemic VTI:  0.23 m                            Systemic Diam: 2.20 cm Phineas Inches Electronically signed by Phineas Inches Signature Date/Time: 03/08/2021/1:31:12 PM    Final    VAS [place] LOWER EXTREMITY VENOUS (DVT)  Result Date: 03/08/2021  Lower Venous DVT Study Patient Name:  EURA RADABAUGH  Date of Exam:   03/08/2021 Medical Rec #: [id number]         Accession #:    [id number] Date of Birth: May 19, 1934        Patient Gender: M Patient Age:   75 years Exam Location:  [facility] Procedure:      VAS [place] LOWER EXTREMITY VENOUS (DVT) Referring Phys: Gleb Mcguire POWELL JR --------------------------------------------------------------------------------  Indications: Elevated d-dimer, cellulitis, suspected PE.  Anticoagulation: Heparin. Limitations: Bilateral bandaging of mid-lower calves. Comparison Study: No prior studies. Performing Technologist: Darlin Coco RDMS, RVT  Examination Guidelines: Shacola Schussler complete evaluation includes B-mode imaging, spectral Doppler, color Doppler, and power Doppler as needed of all accessible portions of each vessel. Bilateral testing is considered an integral part of Cecilia Nishikawa complete examination. Limited examinations for reoccurring indications may be performed as noted. The reflux portion of the exam is performed with the patient  in reverse Trendelenburg.  +---------+---------------+---------+-----------+----------+-------------------+  RIGHT     Compressibility Phasicity Spontaneity Properties Thrombus Aging       +---------+---------------+---------+-----------+----------+-------------------+  CFV       Full            Yes       Yes                                         +---------+---------------+---------+-----------+----------+-------------------+  SFJ       Full                                                                  +---------+---------------+---------+-----------+----------+-------------------+  FV Prox   Full                                                                  +---------+---------------+---------+-----------+----------+-------------------+  FV Mid    Full                                                                  +---------+---------------+---------+-----------+----------+-------------------+  FV Distal Full                                                                  +---------+---------------+---------+-----------+----------+-------------------+  PFV       Full                                                                  +---------+---------------+---------+-----------+----------+-------------------+  POP       Full            Yes       Yes                                         +---------+---------------+---------+-----------+----------+-------------------+  PTV                                                        Limited views of                                                                 proximal segment                                                                 patent by color      +---------+---------------+---------+-----------+----------+-------------------+  PERO                                                       Limited views of                                                                 proximal segment  patent by color      +---------+---------------+---------+-----------+----------+-------------------+   +---------+---------------+---------+-----------+----------+-------------------+  LEFT      Compressibility Phasicity Spontaneity Properties Thrombus Aging       +---------+---------------+---------+-----------+----------+-------------------+  CFV       Full            Yes       Yes                                         +---------+---------------+---------+-----------+----------+-------------------+  SFJ       Full                                                                  +---------+---------------+---------+-----------+----------+-------------------+  FV Prox   Full                                                                  +---------+---------------+---------+-----------+----------+-------------------+  FV Mid    Full                                                                  +---------+---------------+---------+-----------+----------+-------------------+  FV Distal Full                                                                  +---------+---------------+---------+-----------+----------+-------------------+  PFV       Full                                                                  +---------+---------------+---------+-----------+----------+-------------------+  POP       Full            Yes       Yes                                         +---------+---------------+---------+-----------+----------+-------------------+  PTV                                                        Limited views of  proximal segment                                                                 patent by color      +---------+---------------+---------+-----------+----------+-------------------+  PERO                                                       Limited views of                                                                  proximal segment                                                                 patent by color      +---------+---------------+---------+-----------+----------+-------------------+     Summary: RIGHT: - There is no evidence of deep vein thrombosis in the lower extremity. However, portions of this examination were limited- see technologist comments above.  - No cystic structure found in the popliteal fossa.  LEFT: - There is no evidence of deep vein thrombosis in the lower extremity. However, portions of this examination were limited- see technologist comments above.  - No cystic structure found in the popliteal fossa.  *See table(s) above for measurements and observations. Electronically signed by Jamelle Haring on 03/08/2021 at 3:14:03 PM.    Final     Scheduled Meds:  amLODipine  10 mg Oral Daily   aspirin EC  81 mg Oral Daily   cloNIDine  0.1 mg Oral Daily   clopidogrel  75 mg Oral Daily   docusate sodium  100 mg Oral BID   ferrous sulfate  325 mg Oral Q M,W,F   hydrALAZINE  25 mg Oral Q8H   hydrocerin   Topical BID   ipratropium-albuterol  3 mL Nebulization TID   levothyroxine  50 mcg Oral QAC breakfast   magnesium oxide  400 mg Oral BID   metoprolol tartrate  25 mg Oral Daily   pantoprazole  40 mg Oral BID   pravastatin  40 mg Oral QHS   traZODone  50 mg Oral QHS   Continuous Infusions:  sodium chloride 100 mL/hr at 03/09/21 0916   cefTRIAXone (ROCEPHIN)  IV 1 g (03/09/21 1636)   [START ON 03/10/2021] vancomycin       LOS: 5 days    Time spent: over 30 min    Fayrene Helper, MD [facility]   To contact the attending provider between 7A-7P or the covering provider during after hours 7P-7A, please log into the web site [email or web address] and access using universal [place] password for that web site. If you do not have the password, please call  the hospital operator.  03/09/2021, 5:59 PM

## 2021-03-09 NOTE — Progress Notes (Signed)
ANTICOAGULATION CONSULT NOTE - Follow Up Consult  Pharmacy Consult for Heparin Indication: pulmonary embolus  Allergies  Allergen Reactions   Bee Venom Anaphylaxis    Has epi pen   Influenza Vaccines Other (See Comments)    "Mortally sick for 2 weeks"    Patient Measurements: Height: 6\' 2"  (188 cm) Weight: 88.5 kg (195 lb) IBW/kg (Calculated) : 82.2 Heparin Dosing Weight: TBW  Vital Signs: Temp: 97.9 F (36.6 C) (01/15 0447) Temp Source: Oral (01/15 0447) BP: 172/76 (01/15 0447) Pulse Rate: 96 (01/15 0447)  Labs: Recent Labs    03/07/21 0412 03/07/21 0412 03/07/21 1714 03/07/21 1842 03/08/21 0509 03/08/21 1436 03/08/21 2207 03/09/21 0416  HGB 11.7*  --   --   --  10.3*  --   --  11.4*  HCT 36.1*  --   --   --  32.8*  --   --  36.6*  PLT 406*  --   --   --  376  --   --  347  HEPARINUNFRC  --    < >  --   --  0.31 0.41 0.43 <0.10*  CREATININE 1.86*  --   --   --  2.18*  --   --  2.36*  TROPONINIHS  --   --  95* 121* 98*  --   --   --    < > = values in this interval not displayed.     Estimated Creatinine Clearance: 27.1 mL/min (A) (by C-G formula based on SCr of 2.36 mg/dL (H)).   Medications:  Infusions:   sodium chloride 50 mL/hr at 03/08/21 1230   cefTRIAXone (ROCEPHIN)  IV 1 g (03/08/21 1658)   heparin 1,500 Units/hr (03/08/21 1402)   vancomycin 1,000 mg (03/08/21 2203)    Assessment: 87 yo admitted with recurrent cellulitis now with elevated d-dimer 4.2 (however, note acute on CKD) and concern for PE to start IV heparin pending VQ scan results.  Today, 03/09/2021: Heparin level <0.1,sub-therapeutic on heparin 1500 units/hr CBC: Hgb 11.4, plts 347 SCr 2.36 (baseline ~ 1.6-2) VQ scan ordered. RN confirmed heparin running and had not been off, no bleeding   Goal of Therapy:  Heparin level 0.3-0.7 units/ml Monitor platelets by anticoagulation protocol: Yes   Plan:  Heparin bolus 2000 units x 1 Increase heparin IV infusion to 1700  units/hr Heparin level in 8 hours Daily heparin level and CBC Follow up VQ scan results when available.    Dolly Rias RPh 03/09/2021, 6:08 AM

## 2021-03-09 NOTE — Progress Notes (Signed)
Called [facility] about a VQ scan for the patient per Dr. Florene Glen, MD request. Left a call back number. Waiting for returned phone call.

## 2021-03-09 NOTE — Progress Notes (Signed)
Pharmacy Antibiotic Note  Wayne Meadows is a 87 y.o. male with hx recurrent cellulitis who presented to the ED on 03/04/2021 with bilateral LE pain and redness.  He was started on vancomycin and cefepime on admission for cellulitis and this was de-escalated to ancef on 1/11.  Pharmacy has been consulted on 1/12 to resume vancomycin.  Today, 03/09/2021: - day #6 abx - afeb, wbc 11.6 - hx CKD; scr trending up to 2.36  Plan: - adjust vancomycin 1500 mg IV q48h for est AUC 437 - ceftriaxone 1 gm IV q24h per MD  __________________________________________  Height: 6\' 2"  (188 cm) Weight: 88.5 kg (195 lb) IBW/kg (Calculated) : 82.2  Temp (24hrs), Avg:98 F (36.7 C), Min:97.9 F (36.6 C), Max:98.2 F (36.8 C)  Recent Labs  Lab 03/04/21 1256 03/04/21 1427 03/05/21 0408 03/06/21 0406 03/07/21 0412 03/08/21 0509 03/09/21 0416  WBC 15.0*   < > 8.3 7.5 11.4* 11.9* 11.6*  CREATININE 2.38*   < > 2.43* 2.34* 1.86* 2.18* 2.36*  LATICACIDVEN 1.8  --   --   --   --   --   --    < > = values in this interval not displayed.     Estimated Creatinine Clearance: 27.1 mL/min (A) (by C-G formula based on SCr of 2.36 mg/dL (H)).    Allergies  Allergen Reactions   Bee Venom Anaphylaxis    Has epi pen   Influenza Vaccines Other (See Comments)    "Mortally sick for 2 weeks"    Thank you for allowing pharmacy to be a part of this patients care.  Gretta Arab PharmD, BCPS Clinical Pharmacist [facility] [phone] 03/09/2021 8:44 AM

## 2021-03-09 NOTE — Plan of Care (Signed)
  Problem: Education: Goal: Knowledge of General Education information will improve Description Including pain rating scale, medication(s)/side effects and non-pharmacologic comfort measures Outcome: Progressing   Problem: Health Behavior/Discharge Planning: Goal: Ability to manage health-related needs will improve Outcome: Progressing   

## 2021-03-10 LAB — COMPREHENSIVE METABOLIC PANEL
ALT: 6 U/L (ref 0–44)
AST: 19 U/L (ref 15–41)
Albumin: 2.1 g/dL — ABNORMAL LOW (ref 3.5–5.0)
Alkaline Phosphatase: 60 U/L (ref 38–126)
Anion gap: 7 (ref 5–15)
BUN: 27 mg/dL — ABNORMAL HIGH (ref 8–23)
CO2: 18 mmol/L — ABNORMAL LOW (ref 22–32)
Calcium: 8.2 mg/dL — ABNORMAL LOW (ref 8.9–10.3)
Chloride: 115 mmol/L — ABNORMAL HIGH (ref 98–111)
Creatinine, Ser: 2.86 mg/dL — ABNORMAL HIGH (ref 0.61–1.24)
GFR, Estimated: 21 mL/min — ABNORMAL LOW (ref >60)
Glucose, Bld: 86 mg/dL (ref 70–99)
Potassium: 3.7 mmol/L (ref 3.5–5.1)
Sodium: 140 mmol/L (ref 135–145)
Total Bilirubin: 0.7 mg/dL (ref 0.3–1.2)
Total Protein: 5.8 g/dL — ABNORMAL LOW (ref 6.5–8.1)

## 2021-03-10 LAB — URINALYSIS, COMPLETE (UACMP) WITH MICROSCOPIC
Bilirubin Urine: NEGATIVE
Glucose, UA: 50 mg/dL — AB
Ketones, ur: NEGATIVE mg/dL
Leukocytes,Ua: NEGATIVE
Nitrite: NEGATIVE
Protein, ur: >=300 mg/dL — AB
Specific Gravity, Urine: 1.02 (ref 1.005–1.030)
pH: 5 (ref 5.0–8.0)

## 2021-03-10 LAB — CBC WITH DIFFERENTIAL/PLATELET
Abs Immature Granulocytes: 0.15 10*3/uL — ABNORMAL HIGH (ref 0.00–0.07)
Basophils Absolute: 0.1 10*3/uL (ref 0.0–0.1)
Basophils Relative: 1 %
Eosinophils Absolute: 0.4 10*3/uL (ref 0.0–0.5)
Eosinophils Relative: 4 %
HCT: 30.3 % — ABNORMAL LOW (ref 39.0–52.0)
Hemoglobin: 9.7 g/dL — ABNORMAL LOW (ref 13.0–17.0)
Immature Granulocytes: 2 %
Lymphocytes Relative: 13 %
Lymphs Abs: 1.3 10*3/uL (ref 0.7–4.0)
MCH: 29.8 pg (ref 26.0–34.0)
MCHC: 32 g/dL (ref 30.0–36.0)
MCV: 93.2 fL (ref 80.0–100.0)
Monocytes Absolute: 0.8 10*3/uL (ref 0.1–1.0)
Monocytes Relative: 9 %
Neutro Abs: 7.1 10*3/uL (ref 1.7–7.7)
Neutrophils Relative %: 71 %
Platelets: 316 10*3/uL (ref 150–400)
RBC: 3.25 MIL/uL — ABNORMAL LOW (ref 4.22–5.81)
RDW: 14.6 % (ref 11.5–15.5)
WBC: 9.8 10*3/uL (ref 4.0–10.5)
nRBC: 0 % (ref 0.0–0.2)

## 2021-03-10 LAB — BASIC METABOLIC PANEL
Anion gap: 7 (ref 5–15)
BUN: 28 mg/dL — ABNORMAL HIGH (ref 8–23)
CO2: 18 mmol/L — ABNORMAL LOW (ref 22–32)
Calcium: 8.1 mg/dL — ABNORMAL LOW (ref 8.9–10.3)
Chloride: 114 mmol/L — ABNORMAL HIGH (ref 98–111)
Creatinine, Ser: 2.93 mg/dL — ABNORMAL HIGH (ref 0.61–1.24)
GFR, Estimated: 20 mL/min — ABNORMAL LOW (ref >60)
Glucose, Bld: 94 mg/dL (ref 70–99)
Potassium: 3.7 mmol/L (ref 3.5–5.1)
Sodium: 139 mmol/L (ref 135–145)

## 2021-03-10 LAB — SODIUM, URINE, RANDOM: Sodium, Ur: 16 mmol/L

## 2021-03-10 LAB — MAGNESIUM: Magnesium: 2 mg/dL (ref 1.7–2.4)

## 2021-03-10 LAB — CREATININE, URINE, RANDOM: Creatinine, Urine: 220.62 mg/dL

## 2021-03-10 LAB — PHOSPHORUS: Phosphorus: 3.7 mg/dL (ref 2.5–4.6)

## 2021-03-10 MED ORDER — AMOXICILLIN-POT CLAVULANATE 500-125 MG PO TABS
1.0000 | ORAL_TABLET | Freq: Two times a day (BID) | ORAL | Status: DC
  Administered 2021-03-10: 500 mg via ORAL
  Filled 2021-03-10: qty 1

## 2021-03-10 MED ORDER — LACTATED RINGERS IV BOLUS: 1000.0000 mL | Freq: Once | INTRAVENOUS | Status: DC

## 2021-03-10 MED ORDER — DOXYCYCLINE HYCLATE 100 MG PO TABS
100.0000 mg | ORAL_TABLET | Freq: Two times a day (BID) | ORAL | Status: DC
  Administered 2021-03-10 – 2021-03-11 (×2): 100 mg via ORAL
  Filled 2021-03-10 (×2): qty 1

## 2021-03-10 MED ORDER — AMOXICILLIN-POT CLAVULANATE 875-125 MG PO TABS: 1.0000 | ORAL_TABLET | Freq: Two times a day (BID) | ORAL | Status: DC

## 2021-03-10 MED ORDER — AMOXICILLIN-POT CLAVULANATE 500-125 MG PO TABS
1.0000 | ORAL_TABLET | Freq: Two times a day (BID) | ORAL | Status: DC
  Administered 2021-03-10 – 2021-03-11 (×2): 500 mg via ORAL
  Filled 2021-03-10 (×2): qty 1

## 2021-03-10 MED ORDER — DOXYCYCLINE HYCLATE 100 MG PO TABS
100.0000 mg | ORAL_TABLET | Freq: Two times a day (BID) | ORAL | Status: DC
  Administered 2021-03-10: 100 mg via ORAL
  Filled 2021-03-10: qty 1

## 2021-03-10 NOTE — Progress Notes (Signed)
PHARMACY NOTE:  ANTIMICROBIAL RENAL DOSAGE ADJUSTMENT  Current antimicrobial regimen includes a mismatch between antimicrobial dosage and estimated renal function.  As per policy approved by the Pharmacy & Therapeutics and Medical Executive Committees, the antimicrobial dosage will be adjusted accordingly.  Current antimicrobial dosage:  Augmentin 875-125mg  PO BID  Indication: recurrent cellulitis of lower leg  Renal Function:  Estimated Creatinine Clearance: 22.4 mL/min (A) (by C-G formula based on SCr of 2.86 mg/dL (H)). []      On intermittent HD, scheduled: []      On CRRT    Antimicrobial dosage has been changed to:  Augmentin 500-125mg  PO BID  Thank you for allowing pharmacy to be a part of this patient's care.  Luiz Ochoa, [facility] 03/10/2021 8:04 AM

## 2021-03-10 NOTE — Progress Notes (Signed)
SATURATION QUALIFICATIONS: (This note is used to comply with regulatory documentation for home oxygen)  Patient Saturations on Room Air at Rest = 90%  Patient Saturations on Room Air while Ambulating = 81%  Patient Saturations on 2 Liters of oxygen while Ambulating = 94%  Please briefly explain why patient needs home oxygen: Pt noted to desat to 81% on RA during gait and took >2 minutes to recover to 90%. Pt desaturated with transfers as well to 87% and improved with 2L/min to 94%. He will need supplemental O2 to maintain sufficient oxygen perfusion.  Verner Mould, DPT Acute Rehabilitation Services Office [phone] Pager [phone]

## 2021-03-10 NOTE — Progress Notes (Signed)
PROGRESS NOTE    Wayne Meadows  CVE:[id number] DOB: 02-04-1935 DOA: 03/04/2021 PCP: [name]  No chief complaint on file.   Brief Narrative:  Wayne Meadows is Wayne Meadows 87 y.o. male with PMH significant for essential hypertension, CAD with stents, history of CVA, COPD, arthritis, tobacco abuse in remission presented in the ED with complaints of pain, swelling and redness of both lower extremities.  Patient reports that he has been dealing with recurrent cellulitis in both lower extremities for last 2 months.      Assessment & Plan:   Principal Problem:   Recurrent cellulitis of lower leg Active Problems:   Rash   Hypertension   Pleuritic chest pain   AKI (acute kidney injury) (HCC)   Proteinuria   CAD (coronary artery disease)   Hypothyroidism   COPD (chronic obstructive pulmonary disease) (HCC)   Anxiety   Abnormal echocardiogram   Overweight (BMI 25.0-29.9)   CKD (chronic kidney disease), stage III (HCC)   * Recurrent cellulitis of lower leg- (present on admission) Bilateral LE cellulitis Sounds like recurrent episodes since Sept 2022? Continue vanc/ceftriaxone - pain improving since 1/12 Elevated sed rate and CRP ABI wnl I think dermatology follow up with recurrent nature would be reasonable Wound care recs apprecaited   Rash Unclear etiology, scattered flesh colored plaques and papules, intermittent pruritus  Occasional pruritus, continue to follow ? Consider scabies - not classic.  S/p permethrin.  Will give another dose in about Wayne Meadows week. Derm follow outpatient reasonable  Proteinuria UP/C 5.6 Follow with renal outpatient Enalapril currently on hold with AKI at presentation, monitor for resumption  AKI (acute kidney injury) ([place]) baseline creatinine appears to be ~2 Rising today FeNa suggests prerenal, he doesn't appear grossly overloaded, but net positive 12 L.  Will check orthostatics, recheck weight.  Echo from 1/14 with normal vena cava  size with >50% resp variability.  Consider fluid trial vs trial of diuresis based on repeat weight, orthostatics, and exam.   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 Discussed with cards and ok for outpatient follow up  Anxiety Trazodone qhs for insomonia   DVT prophylaxis: heparin Code Status: full Family Communication: none at bedside Disposition:   Status is: Inpatient  Remains inpatient appropriate because: need for IV abx   Consultants:  none  Procedures:  none  Antimicrobials:  Anti-infectives (From admission, onward)    Start     Dose/Rate Route Frequency Ordered Stop   03/10/21 2200  doxycycline (VIBRA-TABS) tablet 100 mg        100 mg Oral Every 12 hours 03/10/21 1535 03/14/21 2159   03/10/21 2200  amoxicillin-clavulanate (AUGMENTIN) 500-125 MG per tablet 500 mg        1 tablet Oral 2 times daily  03/10/21 1535 03/14/21 2159   03/10/21 1000  vancomycin (VANCOREADY) IVPB 1500 mg/300 mL  Status:  Discontinued        1,500 mg 150 mL/hr over 120 Minutes Intravenous Every 48 hours 03/09/21 0851 03/10/21 0751   03/10/21 1000   amoxicillin-clavulanate (AUGMENTIN) 875-125 MG per tablet 1 tablet  Status:  Discontinued        1 tablet Oral Every 12 hours 03/10/21 0751 03/10/21 0805   03/10/21 1000  doxycycline (VIBRA-TABS) tablet 100 mg  Status:  Discontinued        100 mg Oral Every 12 hours 03/10/21 0751 03/10/21 1535   03/10/21 1000  amoxicillin-clavulanate (AUGMENTIN) 500-125 MG per tablet 500 mg  Status:  Discontinued        1 tablet Oral 2 times daily 03/10/21 0805 03/10/21 1535   03/07/21 2300  vancomycin (VANCOCIN) IVPB 1000 mg/200 mL premix  Status:  Discontinued        1,000 mg 200 mL/hr over 60 Minutes Intravenous Every 24 hours 03/07/21 0923 03/09/21 0851   03/06/21 1700  vancomycin (VANCOREADY) IVPB 1500 mg/300 mL  Status:  Discontinued        1,500 mg 150 mL/hr over 120 Minutes Intravenous Every 48 hours 03/06/21 1257 03/07/21 0923   03/06/21 1600  cefTRIAXone (ROCEPHIN) 1 g in sodium chloride 0.9 % 100 mL IVPB  Status:  Discontinued        1 g 200 mL/hr over 30 Minutes Intravenous Every 24 hours 03/06/21 1201 03/10/21 0751   03/06/21 0600  vancomycin (VANCOREADY) IVPB 1250 mg/250 mL  Status:  Discontinued        1,250 mg 166.7 mL/hr over 90 Minutes Intravenous Every 36 hours 03/04/21 1717 03/05/21 0706   03/05/21 1200  ceFEPIme (MAXIPIME) 2 g in sodium chloride 0.9 % 100 mL IVPB  Status:  Discontinued        2 g 200 mL/hr over 30 Minutes Intravenous Every 24 hours 03/04/21 1503 03/05/21 0706   03/05/21 0800  ceFAZolin (ANCEF) IVPB 1 g/50 mL premix  Status:  Discontinued        1 g 100 mL/hr over 30 Minutes Intravenous Every 12 hours 03/05/21 0706 03/06/21 1201   03/04/21 1530  vancomycin (VANCOREADY) IVPB 1750 mg/350 mL        1,750 mg 175 mL/hr over 120 Minutes Intravenous  Once 03/04/21 1452 03/05/21 2000   03/04/21 1400  vancomycin (VANCOCIN) IVPB 1000 mg/200 mL premix  Status:  Discontinued        1,000 mg 200 mL/hr over 60 Minutes Intravenous  Once 03/04/21 1354 03/04/21 1452   03/04/21 1400   ceFEPIme (MAXIPIME) 2 g in sodium chloride 0.9 % 100 mL IVPB        2 g 200 mL/hr over 30 Minutes Intravenous  Once 03/04/21 1354 03/05/21 1957       Subjective: No new complaints  Objective: Vitals:   03/09/21 1743 03/09/21 2149 03/10/21 0528 03/10/21 1357  BP:  (!) 152/68 (!) 179/73 (!) 167/70  Pulse:  93 94 85  Resp:  18 16 18   Temp:  98.2 F (36.8 C) 97.8 F (36.6 C) 98.2 F (36.8 C)  TempSrc:  Oral Oral Oral  SpO2: 91% 91% (!) 89% 95%  Weight:      Height:        Intake/Output Summary (Last 24 hours) at 03/10/2021 1559 Last data filed at 03/10/2021 1400 Gross per 24 hour  Intake 2693.41 ml  Output 0 ml  Net  2693.41 ml   Filed Weights   03/04/21 1213  Weight: 88.5 kg    Examination:  General: No acute distress. Cardiovascular: RRR Lungs: unlabored Abdomen: Soft, nontender, nondistended  Neurological: Alert and oriented 3. Moves all extremities 4 . Cranial nerves II through XII grossly intact. Skin: bilateral dressings intact Extremities: No clubbing or cyanosis. No edema.  Data Reviewed: I have personally reviewed following labs and imaging studies  CBC: Recent Labs  Lab 03/04/21 1256 03/04/21 1427 03/06/21 0406 03/07/21 0412 03/08/21 0509 03/09/21 0416 03/10/21 0357  WBC 15.0*   < > 7.5 11.4* 11.9* 11.6* 9.8  NEUTROABS 12.2*  --  5.1 9.6* 9.2*  --  7.1  HGB 11.9*   < > 10.6* 11.7* 10.3* 11.4* 9.7*  HCT 38.1*   < > 33.0* 36.1* 32.8* 36.6* 30.3*  MCV 93.6   < > 92.4 91.4 93.2 95.6 93.2  PLT 485*   < > 349 406* 376 347 316   < > = values in this interval not displayed.    Basic Metabolic Panel: Recent Labs  Lab 03/06/21 0406 03/07/21 0412 03/08/21 0509 03/09/21 0416 03/10/21 0357 03/10/21 1236  NA 141 140 141 139 140 139  K 3.7 3.8 3.8 3.9 3.7 3.7  CL 112* 107 114* 112* 115* 114*  CO2 21* 20* 19* 18* 18* 18*  GLUCOSE 93 97 84 89 86 94  BUN 16 14 20 22  27* 28*  CREATININE 2.34* 1.86* 2.18* 2.36* 2.86* 2.93*  CALCIUM 8.2* 8.7* 8.3*  8.5* 8.2* 8.1*  MG 1.5* 1.7 2.0 2.1 2.0  --   PHOS 3.1 2.5 3.3 3.5 3.7  --     GFR: Estimated Creatinine Clearance: 21.8 mL/min (Savier Trickett) (by C-G formula based on SCr of 2.93 mg/dL (H)).  Liver Function Tests: Recent Labs  Lab 03/06/21 0406 03/07/21 0412 03/08/21 0509 03/09/21 0416 03/10/21 0357  AST 11* 20 26 26 19   ALT 8 8 6 7 6   ALKPHOS 68 77 70 71 60  BILITOT 0.4 0.6 0.5 0.6 0.7  PROT 6.2* 6.7 6.2* 6.7 5.8*  ALBUMIN 2.2* 2.4* 2.3* 2.5* 2.1*    CBG: No results for input(s): GLUCAP in the last 168 hours.   Recent Results (from the past 240 hour(s))  Resp Panel by RT-PCR (Flu Criston Chancellor&B, Covid) Nasopharyngeal Swab     Status: None   Collection Time: 03/04/21 12:56 PM   Specimen: Nasopharyngeal Swab; Nasopharyngeal(NP) swabs in vial transport medium  Result Value Ref Range Status   SARS Coronavirus 2 by RT PCR NEGATIVE NEGATIVE Final    Comment: (NOTE) SARS-CoV-2 target nucleic acids are NOT DETECTED.  The SARS-CoV-2 RNA is generally detectable in upper respiratory specimens during the acute phase of infection. The lowest concentration of SARS-CoV-2 viral copies this assay can detect is 138 copies/mL. Zayonna Ayuso negative result does not preclude SARS-Cov-2 infection and should not be used as the sole basis for treatment or other patient management decisions. Grizelda Piscopo negative result may occur with  improper specimen collection/handling, submission of specimen other than nasopharyngeal swab, presence of viral mutation(s) within the areas targeted by this assay, and inadequate number of viral copies(<138 copies/mL). Cassius Cullinane negative result must be combined with clinical observations, patient history, and epidemiological information. The expected result is Negative.  Fact Sheet for Patients:  EntrepreneurPulse.com.au  Fact Sheet for Healthcare Providers:  IncredibleEmployment.be  This test is no t yet approved or cleared by the [place] FDA and  has been  authorized for detection and/or diagnosis of SARS-CoV-2  by FDA under an Emergency Use Authorization (EUA). This EUA will remain  in effect (meaning this test can be used) for the duration of the COVID-19 declaration under Section 564(b)(1) of the Act, 21 U.S.C.section 360bbb-3(b)(1), unless the authorization is terminated  or revoked sooner.       Influenza Chantry Headen by PCR NEGATIVE NEGATIVE Final   Influenza B by PCR NEGATIVE NEGATIVE Final    Comment: (NOTE) The Xpert Xpress SARS-CoV-2/FLU/RSV plus assay is intended as an aid in the diagnosis of influenza from Nasopharyngeal swab specimens and should not be used as Detrich Rakestraw sole basis for treatment. Nasal washings and aspirates are unacceptable for Xpert Xpress SARS-CoV-2/FLU/RSV testing.  Fact Sheet for Patients: EntrepreneurPulse.com.au  Fact Sheet for Healthcare Providers: IncredibleEmployment.be  This test is not yet approved or cleared by the [place] FDA and has been authorized for detection and/or diagnosis of SARS-CoV-2 by FDA under an Emergency Use Authorization (EUA). This EUA will remain in effect (meaning this test can be used) for the duration of the COVID-19 declaration under Section 564(b)(1) of the Act, 21 U.S.C. section 360bbb-3(b)(1), unless the authorization is terminated or revoked.  Performed at [facility], [place] [address] [place], [place] [address]          Radiology Studies: NM Pulmonary Perfusion  Result Date: 03/09/2021 CLINICAL DATA:  Positive D-dimer.  History of COPD. EXAM: NUCLEAR MEDICINE PERFUSION LUNG SCAN TECHNIQUE: Perfusion images were obtained in multiple projections after intravenous injection of radiopharmaceutical. Ventilation scans intentionally deferred if perfusion scan and chest x-ray adequate for interpretation during COVID 19 epidemic. RADIOPHARMACEUTICALS:  4.4 mCi Tc-91m MAA IV COMPARISON:  03/07/2021.  Current chest radiograph  is pending. FINDINGS: There are no segmental perfusion defects to suggest pulmonary thromboembolism. Nonsegmental heterogeneous areas of relative decreased perfusion are noted, mostly in the upper lobes, consistent with emphysema. IMPRESSION: No evidence of Natasa Stigall pulmonary embolism. Electronically Signed   By: Lajean Manes M.D.   On: 03/09/2021 14:42   DG CHEST PORT 1 VIEW  Result Date: 03/09/2021 CLINICAL DATA:  Cough EXAM: PORTABLE CHEST 1 VIEW COMPARISON:  03/07/2021 FINDINGS: Two frontal views of the chest demonstrate Azia Toutant stable cardiac silhouette. The lungs are hyperinflated with background interstitial prominence consistent with emphysema. No acute airspace disease, effusion, or pneumothorax. No acute bony abnormalities. IMPRESSION: 1. Stable emphysema.  No acute process. Electronically Signed   By: Randa Ngo M.D.   On: 03/09/2021 16:06    Scheduled Meds:  amLODipine  10 mg Oral Daily   amoxicillin-clavulanate  1 tablet Oral BID   aspirin EC  81 mg Oral Daily   cloNIDine  0.1 mg Oral Daily   clopidogrel  75 mg Oral Daily   docusate sodium  100 mg Oral BID   doxycycline  100 mg Oral Q12H   ferrous sulfate  325 mg Oral Q M,W,F   hydrALAZINE  25 mg Oral Q8H   hydrocerin   Topical BID   ipratropium-albuterol  3 mL Nebulization TID   levothyroxine  50 mcg Oral QAC breakfast   magnesium oxide  400 mg Oral BID   metoprolol tartrate  25 mg Oral Daily   pantoprazole  40 mg Oral BID   pravastatin  40 mg Oral QHS   traZODone  50 mg Oral QHS   Continuous Infusions:     LOS: 6 days    Time spent: over 30 min    Fayrene Helper, MD [facility]   To contact the attending provider between 7A-7P  or the covering provider during after hours 7P-7A, please log into the web site [email or web address] and access using universal [place] password for that web site. If you do not have the password, please call the hospital operator.  03/10/2021, 3:59 PM

## 2021-03-10 NOTE — Progress Notes (Signed)
Physical Therapy Treatment Patient Details Name: Wayne Meadows MRN: [id number] DOB: September 10, 1934 Today's Date: 03/10/2021   History of Present Illness 87 y.o. male with PMH significant for essential hypertension, CAD with stents, history of CVA, COPD (not on home O2), arthritis, tobacco abuse in remission presented in the ED with complaints of pain, swelling and redness of both lower extremities. Dx of recurrent cellulitis.    PT Comments    Patient required encouragement to participate but agreeable to mobilize. Pt ambulated ~20' with RW and no overt LOB however pt required cues to maintain safe position to RW. Pt completed stair mobility with min guard for safety. Pt noted to desat to 81% on RA during gait and took >2 minutes to recover to 90%. Pt desaturated with 5xsit<>stand as well to 87% and improved with 2L/min to 94% in less than 1 min. Given fatigue and weakness recommend HHPT. [facility] also [facility] as he requires it for sufficient saturation during mobility.    Recommendations for follow up therapy are one component of a multi-disciplinary discharge planning process, led by the attending physician.  Recommendations may be updated based on patient status, additional functional criteria and insurance authorization.  Follow Up Recommendations  Home health PT     Assistance Recommended at Discharge Set up Supervision/Assistance  Patient can return home with the following A little help with walking and/or transfers;Assistance with cooking/housework;Assist for transportation;Help with stairs or ramp for entrance   Equipment Recommendations       Recommendations for Other Services       Precautions / Restrictions Precautions Precautions: Fall Precaution Comments: denies falls in past 6 months Restrictions Weight Bearing Restrictions: No     Mobility  Bed Mobility Overal bed mobility: Modified Independent             General bed mobility  comments: HOB elevated, pt using bed rail and extra time    Transfers Overall transfer level: Needs assistance Equipment used: Rolling walker (2 wheels) Transfers: Sit to/from Stand Sit to Stand: Supervision           General transfer comment: supervision for safety, cues to power up with UE from EOB not RW. EOB elevated slightly.    Ambulation/Gait Ambulation/Gait assistance: Min guard;Supervision Gait Distance (Feet): 20 Feet Assistive device: Rolling walker (2 wheels) Gait Pattern/deviations: Step-through pattern;Decreased stride length Gait velocity: decr     General Gait Details: No overt LOB throughuout. pt required ceus for safe proxmitiy of RW as he has tendency to keep it too far forward. SpO2 drop to 81% on RA with gait and took 2-3 minutes to recover to 90%.   Stairs Stairs: Yes Stairs assistance: Min guard Stair Management: One rail Left;Step to pattern;Forwards Number of Stairs: 3 General stair comments: pt sequenced stairs "up with strong, down with weak". no LOB noted and pt required guarding with single rail only.   Wheelchair Mobility    Modified Rankin (Stroke Patients Only)       Balance Overall balance assessment: Needs assistance Sitting-balance support: Feet supported Sitting balance-Leahy Scale: Good     Standing balance support: Reliant on assistive device for balance;During functional activity;Bilateral upper extremity supported Standing balance-Leahy Scale: Fair                              Cognition Arousal/Alertness: Awake/alert Behavior During Therapy: WFL for tasks assessed/performed Overall Cognitive Status: Within Functional Limits for  tasks assessed                                 General Comments: a little grumpy at times but agreeable with encouragement        Exercises      General Comments        Pertinent Vitals/Pain Pain Assessment: 0-10 Pain Score: 7  Pain Location: back  pain Pain Descriptors / Indicators: Aching;Discomfort Pain Intervention(s): Limited activity within patient's tolerance;Monitored during session;Repositioned;Heat applied;Patient requesting pain meds-RN notified    Home Living                          Prior Function            PT Goals (current goals can now be found in the care plan section) Acute Rehab PT Goals Patient Stated Goal: likes to read PT Goal Formulation: With patient Time For Goal Achievement: 03/19/21 Potential to Achieve Goals: Good Progress towards PT goals: Progressing toward goals    Frequency    Min 3X/week      PT Plan Current plan remains appropriate    Co-evaluation              AM-PAC PT "6 Clicks" Mobility   Outcome Measure  Help needed turning from your back to your side while in a flat bed without using bedrails?: None Help needed moving from lying on your back to sitting on the side of a flat bed without using bedrails?: A Little Help needed moving to and from a bed to a chair (including a wheelchair)?: A Little Help needed standing up from a chair using your arms (e.g., wheelchair or bedside chair)?: A Little Help needed to walk in hospital room?: A Little Help needed climbing 3-5 steps with a railing? : A Little 6 Click Score: 19    End of Session Equipment Utilized During Treatment: Gait belt Activity Tolerance: Patient tolerated treatment well Patient left: in bed;with call bell/phone within reach (seated EOB) Nurse Communication: Mobility status PT Visit Diagnosis: Difficulty in walking, not elsewhere classified (R26.2);Pain Pain - part of body:  (back)     Time: 1256-1330 PT Time Calculation (min) (ACUTE ONLY): 34 min  Charges:  $Gait Training: 23-37 mins                     Wayne Meadows, DPT Acute Rehabilitation Services Office [phone] Pager [phone]    Wayne Meadows 03/10/2021, 3:17 PM

## 2021-03-11 MED ORDER — IPRATROPIUM-ALBUTEROL 0.5-2.5 (3) MG/3ML IN SOLN: 3.0000 mL | Freq: Two times a day (BID) | RESPIRATORY_TRACT | Status: DC

## 2021-03-11 MED ORDER — DOXYCYCLINE HYCLATE 100 MG PO TABS: 100.0000 mg | ORAL_TABLET | Freq: Two times a day (BID) | ORAL | 0 refills | Status: AC

## 2021-03-11 MED ORDER — AMOXICILLIN-POT CLAVULANATE 500-125 MG PO TABS: 1.0000 | ORAL_TABLET | Freq: Two times a day (BID) | ORAL | 0 refills | Status: AC

## 2021-03-11 NOTE — Discharge Summary (Signed)
Physician Discharge Summary  KHAMRON GELLERT WSF:[id number] DOB: [birth year] DOA: 03/04/2021  PCP: [name]  Admit date: 03/04/2021 Discharge date: 03/11/2021  Time spent: 40 minutes  Recommendations for Outpatient Follow-up:  Follow outpatient CBC/CMP Follow with renal outpatient for proteinuria, worsening creatinine Follow with dermatology for LE's outpatient, generalized rash Consider repeat dose of permethrin if concern for scabies Follow abnormal echo outpatient Follow o2 needs outpatient    Discharge Diagnoses:  Principal Problem:   Recurrent cellulitis of lower leg Active Problems:   Rash   Hypertension   Pleuritic chest pain   AKI (acute kidney injury) ([place])   Proteinuria   CAD (coronary artery disease)   Hypothyroidism   COPD (chronic obstructive pulmonary disease) (H[facility])   Anxiety   Abnormal echocardiogram   Overweight (BMI 25.0-29.9)   CKD (chronic kidney disease), stage III [facility])   Discharge Condition: stable  Diet recommendation: heart heatlhy  Filed Weights   03/04/21 1213 03/10/21 1629  Weight: 88.5 kg 94.9 kg    History of present illness:  Wayne Meadows is Wayne Meadows 87 y.o. male with PMH significant for essential hypertension, CAD with stents, history of CVA, COPD, arthritis, tobacco abuse in remission presented in the ED with complaints of pain, swelling and redness of both lower extremities.  Patient reports that he has been dealing with recurrent cellulitis in both lower extremities for last 2 months.   He was treated with antibiotics and received wound care.  He's gradually improved.  He had generalized rash and was treated with permetherin for concern for scabies.  Hospitalization was complicated by AKI.  He refused labs on 1/17 and requested discharge.  Recommended holding enalapril and following with outpatient nephrologist.  Hospital Course:  * Recurrent cellulitis of lower leg- (present on admission) Bilateral LE cellulitis Sounds  like recurrent episodes since Sept 2022? Continue vanc/ceftriaxone - pain improving since 1/12 - discharged on abx Elevated sed rate and CRP ABI wnl I think dermatology follow up with recurrent nature would be reasonable Wound care recs apprecaited   Rash Unclear etiology, scattered flesh colored plaques and papules, intermittent pruritus  Occasional pruritus, continue to follow ? Consider scabies - not classic.  S/p permethrin.  Will give another dose in about Izael Bessinger week. Derm follow outpatient reasonable  Proteinuria UP/C 5.6 Follow with renal outpatient Enalapril currently on hold with AKI at presentation, monitor for resumption  AKI (acute kidney injury) ([place]) baseline creatinine appears to be ~2 Rising today FeNa suggests prerenal, he didn't appear grossly overloaded, but net positive 12 L.  Will check orthostatics (negative), recheck weight (similar to weight in November).  Echo from 1/14 with normal vena cava size with >50% resp variability.  Given O2 requirement on 1/16, considered lasix trial but he refused and has refused labs today.  He wants to follow outpatient. 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 Discussed with cards and ok for outpatient follow up  Anxiety noted    Procedures:  LE [place] Summary:  RIGHT:  - There is no evidence of deep vein thrombosis in the lower extremity.  However, portions of this examination were limited- see technologist  comments above.     - No cystic structure found in the popliteal fossa.     LEFT:  - There is no evidence of deep vein thrombosis in the lower extremity.  However, portions of this examination were limited- see technologist  comments above.     - No cystic structure found in the popliteal fossa.   Echo IMPRESSIONS     1. Left ventricular ejection fraction, by estimation, is 65 to 70%. The  left ventricle has hyperdynamic function. The left ventricle has no  regional wall motion abnormalities. There is mild concentric left  ventricular hypertrophy. Left ventricular  diastolic parameters are consistent with Grade I diastolic dysfunction  (impaired relaxation).   2. Right ventricular systolic function is normal. The right ventricular  size is normal. There is mildly elevated pulmonary artery systolic  pressure.   3. Left atrial size was mildly dilated.   4. 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  can't rule out PFO.   5. Right atrial size was mildly dilated.   6. The mitral valve is normal in structure. No evidence of mitral valve  regurgitation.   7. The aortic valve was not well visualized. Aortic valve regurgitation  is mild to moderate.   8. The inferior vena cava is normal in size with greater than 50%  respiratory variability, suggesting right atrial pressure of 3 mmHg.   Comparison(s): No prior Echocardiogram.   Summary:  Right: Resting right ankle-brachial index is within normal range. No  evidence of  significant right lower extremity arterial disease. The right  toe-brachial index is abnormal.   Left: Resting left ankle-brachial index is within normal range. No  evidence of significant left lower extremity arterial disease. The left  toe-brachial index is abnormal.   Consultations: none  Discharge Exam: Vitals:   03/11/21 0724 03/11/21 1343  BP:  (!) 163/70  Pulse:  87  Resp:  18  Temp:  97.6 F (36.4 C)  SpO2: 91% 94%   Eager for discharge Refusing labs  General: No acute distress. Cardiovascular: Heart sounds show Mcgregor Tinnon regular rate, and rhythm.  Lungs: Clear to auscultation bilaterally with good air movement.  Abdomen: Soft, nontender, nondistended Neurological: Alert and oriented 3. Moves all extremities 4. Cranial nerves II through XII grossly intact. Skin: Warm and dry. No rashes or lesions. Extremities: dressings intact  Discharge Instructions   Discharge Instructions     Ambulatory referral to Dermatology   Complete by: As directed    Call MD for:  difficulty breathing, headache or visual disturbances   Complete by: As directed    Call MD for:  extreme fatigue   Complete by: As directed    Call MD for:  hives   Complete by: As directed    Call MD for:  persistant dizziness or light-headedness   Complete by: As directed    Call MD for:  persistant nausea and vomiting   Complete by: As directed    Call MD for:  redness,  tenderness, or signs of infection (pain, swelling, redness, odor or green/yellow discharge around incision site)   Complete by: As directed    Call MD for:  severe uncontrolled pain   Complete by: As directed    Call MD for:  temperature >100.4   Complete by: As directed    Diet - low sodium heart healthy   Complete by: As directed    Discharge instructions   Complete by: As directed    You were seen for cellulitis and have improved.  Continue your wound care at home as prescribed.  Follow up with your PCP to follow your wound  care.  I wanted to check your kidney function today, but you declined labs.  Your kidney function was down yesterday and needs close follow up.  Hold your enalapril until you follow up with your PCP for labs.  You have Pamla Pangle lot of protein in your urine, this should be followed with your kidney doctor.  Take 1 more dose of the permethrin in 1 week for possible scabies.  You have abnormal echo findings that should be followed outpatient.  I'll try to get you home health.   Return for new, recurrent, or worsening symptoms.  Please ask your PCP to request records from this hospitalization so they know what was done and what the next steps will be.   Discharge wound care:   Complete by: As directed    Wound care to bilateral LEs:  Cleanse with house skin cleanser, pat dry.  Apply Eucerin Cream to legs and feet, but do not apply between digits.  Top open areas with folded pieces of xeroform gauze Kellie Simmering # [id number]), cover with ABD pads and Secure by wrapping from just below toes to just below knees/paper with Kerlix roll gauze/tape. Float heels.   Increase activity slowly   Complete by: As directed       Allergies as of 03/11/2021       Reactions   Bee Venom Anaphylaxis   Has epi pen   Influenza Vaccines Other (See Comments)   "Mortally sick for 2 weeks"        Medication List     STOP taking these medications    enalapril 20 MG tablet Commonly known as: VASOTEC       TAKE these medications    albuterol 108 (90 Base) MCG/ACT inhaler Commonly known as: VENTOLIN HFA Inhale 1 puff into the lungs every 6 (six) hours as needed for shortness of breath.   amLODipine 10 MG tablet Commonly known as: NORVASC Take 1 tablet (10 mg total) by mouth daily.   amoxicillin-clavulanate 500-125 MG tablet Commonly known as: AUGMENTIN Take 1 tablet (500 mg total) by mouth 2 (two) times daily for 3 days.   aspirin EC 81 MG tablet Take 81 mg by mouth daily.   baclofen 10 MG tablet Commonly  known as: LIORESAL Take 10 mg by mouth at bedtime.   cloNIDine 0.1 MG tablet Commonly known as: CATAPRES Take 1 tablet (0.1 mg total) by mouth daily.   clopidogrel 75 MG tablet Commonly known as: PLAVIX Take 75 mg by mouth daily.   diclofenac Sodium 1 % Gel Commonly known as: VOLTAREN Apply 2 g topically 4 (four) times daily.   doxycycline 100 MG tablet Commonly known as: VIBRA-TABS Take 1 tablet (100 mg total) by mouth every 12 (twelve) hours for 3 days.   EPINEPHrine 0.3 mg/0.3 mL Devi Commonly known as: EPI-PEN Inject 0.3 mg into the muscle once as  needed (for anaphylactic reaction).   ferrous sulfate 325 (65 FE) MG tablet Take 1 tablet by mouth. Every Monday, Wednesday, and Friday   gabapentin 300 MG capsule Commonly known as: NEURONTIN Take 300-600 mg by mouth See admin instructions. Take 300 mg by mouth in the morning then 300 mg at noon then 600 mg at bedtime   HYDROcodone-acetaminophen 10-325 MG tablet Commonly known as: NORCO Take 1 tablet by mouth 2 (two) times daily.   Ipratropium-Albuterol 20-100 MCG/ACT Aers respimat Commonly known as: COMBIVENT Inhale 1 puff into the lungs 4 (four) times daily.   levothyroxine 50 MCG tablet Commonly known as: SYNTHROID Take 50 mcg by mouth daily before breakfast.   LORazepam 0.5 MG tablet Commonly known as: Ativan 1 PO 1 hour before MRI repeat after 30 minutes if needed   melatonin 3 MG Tabs tablet Take 6 mg by mouth at bedtime.   metoprolol tartrate 25 MG tablet Commonly known as: LOPRESSOR Take 25 mg by mouth daily.   multivitamin tablet Take 1 tablet by mouth daily.   nitroGLYCERIN 0.4 MG SL tablet Commonly known as: NITROSTAT Place 0.4 mg under the tongue every 5 (five) minutes as needed for chest pain.   ondansetron 4 MG tablet Commonly known as: ZOFRAN Take 1 tablet (4 mg total) by mouth every 6 (six) hours as needed for nausea.   pantoprazole 40 MG tablet Commonly known as: PROTONIX Take 1 tablet  (40 mg total) by mouth 2 (two) times daily.   polyethylene glycol 17 g packet Commonly known as: MIRALAX / GLYCOLAX Take 17 g by mouth daily as needed for mild constipation or moderate constipation.   pravastatin 40 MG tablet Commonly known as: PRAVACHOL Take 40 mg by mouth at bedtime.               Discharge Care Instructions  (From admission, onward)           Start     Ordered   03/11/21 0000  Discharge wound care:       Comments: Wound care to bilateral LEs:  Cleanse with house skin cleanser, pat dry.  Apply Eucerin Cream to legs and feet, but do not apply between digits.  Top open areas with folded pieces of xeroform gauze Kellie Simmering # 294), cover with ABD pads and Secure by wrapping from just below toes to just below knees/paper with Kerlix roll gauze/tape. Float heels.   03/11/21 1706           Allergies  Allergen Reactions   Bee Venom Anaphylaxis    Has epi pen   Influenza Vaccines Other (See Comments)    "Mortally sick for 2 weeks"    Follow-up Information     [facility] Follow [facility]   Specialty: [place] Why: [facility] will contact you once [facility] authorization has been approved to set up services.                 The results of significant diagnostics from this hospitalization (including imaging, microbiology, ancillary and laboratory) are listed below for reference.    Significant Diagnostic Studies: DG Chest 2 View  Result Date: 03/04/2021 CLINICAL DATA:  Productive cough EXAM: CHEST - 2 VIEW COMPARISON:  January 01, 2021. FINDINGS: Similar right basilar atelectasis. Suspected trace bilateral pleural effusions. No consolidation. No visible pleural effusions or pneumothorax. Incompletely imaged lumbar fusion hardware. IMPRESSION: 1. Similar right basilar atelectasis. 2. Suspected trace bilateral pleural effusions. 3. Chronic hyperinflation. Electronically Signed  By: Margaretha Sheffield M.D.   On: 03/04/2021 13:11    NM Pulmonary Perfusion  Result Date: 03/09/2021 CLINICAL DATA:  Positive D-dimer.  History of COPD. EXAM: NUCLEAR MEDICINE PERFUSION LUNG SCAN TECHNIQUE: Perfusion images were obtained in multiple projections after intravenous injection of radiopharmaceutical. Ventilation scans intentionally deferred if perfusion scan and chest x-ray adequate for interpretation during COVID 19 epidemic. RADIOPHARMACEUTICALS:  4.4 mCi Tc-83m MAA IV COMPARISON:  03/07/2021.  Current chest radiograph is pending. FINDINGS: There are no segmental perfusion defects to suggest pulmonary thromboembolism. Nonsegmental heterogeneous areas of relative decreased perfusion are noted, mostly in the upper lobes, consistent with emphysema. IMPRESSION: No evidence of Raymonde Hamblin pulmonary embolism. Electronically Signed   By: Lajean Manes M.D.   On: 03/09/2021 14:42   US RENAL  Result Date: 03/05/2021 CLINICAL DATA:  Acute kidney injury, history of left kidney biopsy and partial left nephrectomy. EXAM: RENAL / URINARY TRACT ULTRASOUND COMPLETE COMPARISON:  No prior renal ultrasound, correlation is made with abdomen ultrasound 03/16/2008 and CT abdomen pelvis 08/12/2020 FINDINGS: Right Kidney: Renal measurements: 10.5 x 5.9 x 6.8 cm = volume: 221 mL. Echogenicity is increased. No hydronephrosis visualized. Multiple anechoic lesions are seen, compatible with renal cysts, the largest of which measures up to 4.2 x 4.0 x 3.7 cm. Left Kidney: Renal measurements: 10.2 x 5.9 x 5.5 cm = volume: 174 mL. Echogenicity is increased. No hydronephrosis visualized. Multiple anechoic lesions are seen, compatible with renal cysts, the largest of which measures up to 3.2 x 2.5 x 2.8 cm Bladder: Appears normal for degree of bladder distention. Other: None. IMPRESSION: 1. Increased renal echogenicity bilaterally, compatible with medical renal disease. 2. Multiple anechoic lesions in the bilateral kidneys, compatible with renal cysts. Electronically Signed   By: Merilyn Baba M.D.   On: 03/05/2021 12:41   DG CHEST PORT 1 VIEW  Result Date: 03/09/2021 CLINICAL DATA:  Cough EXAM: PORTABLE CHEST 1 VIEW COMPARISON:  03/07/2021 FINDINGS: Two frontal views of the chest demonstrate Aliena Ghrist stable cardiac silhouette. The lungs are hyperinflated with background interstitial prominence consistent with emphysema. No acute airspace disease, effusion, or pneumothorax. No acute bony abnormalities. IMPRESSION: 1. Stable emphysema.  No acute process. Electronically Signed   By: Randa Ngo M.D.   On: 03/09/2021 16:06   DG CHEST PORT 1 VIEW  Result Date: 03/07/2021 CLINICAL DATA:  Cough EXAM: PORTABLE CHEST 1 VIEW COMPARISON:  03/04/2021 FINDINGS: Single frontal view of the chest demonstrates Libbi Towner stable cardiac silhouette. Lungs are hyperinflated with chronic bilateral scarring unchanged. No acute airspace disease, effusion, or pneumothorax. No acute bony abnormality. IMPRESSION: 1. Emphysema, no acute intrathoracic process. Electronically Signed   By: Randa Ngo M.D.   On: 03/07/2021 15:01   VAS [place] ABI WITH/WO TBI  Result Date: 03/06/2021  LOWER EXTREMITY DOPPLER STUDY Patient Name:  GARRIE WOODIN  Date of Exam:   03/06/2021 Medical Rec #: [id number]         Accession #:    [id number] Date of Birth: 29-Nov-1934        Patient Gender: M Patient Age:   37 years Exam Location:  [facility] Procedure:      VAS [place] ABI WITH/WO TBI Referring Phys: Coty Larsh POWELL JR --------------------------------------------------------------------------------  Indications: Cellulitis. High Risk Factors: Hypertension, coronary artery disease.  Comparison Study: No prior studies. Performing Technologist: Carlos Levering RVT  Examination Guidelines: Mardee Clune complete evaluation includes at minimum, Doppler waveform signals and systolic blood pressure reading at the level of bilateral brachial, anterior  tibial, and posterior tibial arteries, when vessel segments are accessible. Bilateral testing is considered an  integral part of Elinda Bunten complete examination. Photoelectric Plethysmograph (PPG) waveforms and toe systolic pressure readings are included as required and additional duplex testing as needed. Limited examinations for reoccurring indications may be performed as noted.  ABI Findings: +---------+------------------+-----+--------+--------+  Right     Rt Pressure (mmHg) Index Waveform Comment   +---------+------------------+-----+--------+--------+  Brachial  173                                         +---------+------------------+-----+--------+--------+  PTA       230                1.26  biphasic           +---------+------------------+-----+--------+--------+  DP        219                1.20  biphasic           +---------+------------------+-----+--------+--------+  Great Toe 85                 0.46                     +---------+------------------+-----+--------+--------+ +---------+------------------+-----+---------+-------+  Left      Lt Pressure (mmHg) Index Waveform  Comment  +---------+------------------+-----+---------+-------+  Brachial  183                      triphasic          +---------+------------------+-----+---------+-------+  PTA       189                1.03  biphasic           +---------+------------------+-----+---------+-------+  DP        186                1.02  biphasic           +---------+------------------+-----+---------+-------+  Great Toe 105                0.57                     +---------+------------------+-----+---------+-------+ +-------+-----------+-----------+------------+------------+  ABI/TBI Today's ABI Today's TBI Previous ABI Previous TBI  +-------+-----------+-----------+------------+------------+  Right   1.26                                               +-------+-----------+-----------+------------+------------+  Left    1.03                                               +-------+-----------+-----------+------------+------------+  Summary: Right: Resting right  ankle-brachial index is within normal range. No evidence of significant right lower extremity arterial disease. The right toe-brachial index is abnormal. Left: Resting left ankle-brachial index is within normal range. No evidence of significant left lower extremity arterial disease. The left toe-brachial index is abnormal.  *See table(s) above for measurements and observations.  Electronically signed by Orlie Pollen on 03/06/2021 at 3:57:06 PM.    Final  ECHOCARDIOGRAM COMPLETE  Result Date: 03/08/2021    ECHOCARDIOGRAM REPORT   Patient Name:   TYRAE ALCOSER Date of Exam: 03/08/2021 Medical Rec #:  [id number]        Height:       74.0 in Accession #:    [id number]       Weight:       195.0 lb Date of Birth:  14-Apr-1934       BSA:          2.150 m Patient Age:    63 years         BP:           172/66 mmHg Patient Gender: M                HR:           90 bpm. Exam Location:  Inpatient Procedure: 2D Echo Indications:    "Elevated brain natriuretic peptide level"  History:        Patient has no prior history of Echocardiogram examinations.                 CAD, COPD and Stroke; Risk Factors:Hypertension.  Sonographer:    Arlyss Gandy Referring Phys: [place]  1. Left ventricular ejection fraction, by estimation, is 65 to 70%. The left ventricle has hyperdynamic function. The left ventricle has no regional wall motion abnormalities. There is mild concentric left ventricular hypertrophy. Left ventricular diastolic parameters are consistent with Grade I diastolic dysfunction (impaired relaxation).  2. Right ventricular systolic function is normal. The right ventricular size is normal. There is mildly elevated pulmonary artery systolic pressure.  3. Left atrial size was mildly dilated.  4. 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 can't rule out PFO.  5. Right atrial size was mildly dilated.  6. The mitral valve is normal in structure. No evidence of mitral  valve regurgitation.  7. The aortic valve was not well visualized. Aortic valve regurgitation is mild to moderate.  8. The inferior vena cava is normal in size with greater than 50% respiratory variability, suggesting right atrial pressure of 3 mmHg. Comparison(s): No prior Echocardiogram. FINDINGS  Left Ventricle: Left ventricular ejection fraction, by estimation, is 65 to 70%. The left ventricle has hyperdynamic function. The left ventricle has no regional wall motion abnormalities. The left ventricular internal cavity size was normal in size. There is mild concentric left ventricular hypertrophy. Left ventricular diastolic parameters are consistent with Grade I diastolic dysfunction (impaired relaxation). Right Ventricle: The right ventricular size is normal. No increase in right ventricular wall thickness. Right ventricular systolic function is normal. There is mildly elevated pulmonary artery systolic pressure. The tricuspid regurgitant velocity is 2.96  m/s, and with an assumed right atrial pressure of 3 mmHg, the estimated right ventricular systolic pressure is 38.1 mmHg. Left Atrium: Left atrial size was mildly dilated. Right Atrium: Right atrial size was mildly dilated. Pericardium: There is no evidence of pericardial effusion. Mitral Valve: The mitral valve is normal in structure. No evidence of mitral valve regurgitation. Tricuspid Valve: The tricuspid valve is normal in structure. Tricuspid valve regurgitation is mild. Aortic Valve: The aortic valve was not well visualized. Aortic valve regurgitation is mild to moderate. Aortic valve mean gradient measures 8.0 mmHg. Aortic valve peak gradient measures 12.8 mmHg. Aortic valve area, by VTI measures 2.49 cm. Pulmonic Valve: The pulmonic valve was not well visualized. Aorta: The aortic root and  ascending aorta are structurally normal, with no evidence of dilitation. Venous: The inferior vena cava is normal in size with greater than 50% respiratory  variability, suggesting right atrial pressure of 3 mmHg. IAS/Shunts: Can't rule out PFO.  LEFT VENTRICLE PLAX 2D LVIDd:         4.40 cm   Diastology LVIDs:         3.00 cm   LV e' medial:    7.51 cm/s LV PW:         1.10 cm   LV E/e' medial:  11.0 LV IVS:        1.20 cm   LV e' lateral:   8.81 cm/s LVOT diam:     2.20 cm   LV E/e' lateral: 9.4 LV SV:         88 LV SV Index:   41 LVOT Area:     3.80 cm  RIGHT VENTRICLE             IVC RV Basal diam:  4.00 cm     IVC diam: 2.30 cm RV S prime:     21.10 cm/s TAPSE (M-mode): 2.3 cm LEFT ATRIUM             Index        RIGHT ATRIUM           Index LA diam:        4.70 cm 2.19 cm/m   RA Area:     19.80 cm LA Vol (A2C):   94.9 ml 44.14 ml/m  RA Volume:   55.60 ml  25.86 ml/m LA Vol (A4C):   68.2 ml 31.72 ml/m LA Biplane Vol: 83.0 ml 38.60 ml/m  AORTIC VALVE AV Area (Vmax):    2.59 cm AV Area (Vmean):   2.39 cm AV Area (VTI):     2.49 cm AV Vmax:           179.00 cm/s AV Vmean:          134.000 cm/s AV VTI:            0.353 m AV Peak Grad:      12.8 mmHg AV Mean Grad:      8.0 mmHg LVOT Vmax:         122.00 cm/s LVOT Vmean:        84.400 cm/s LVOT VTI:          0.231 m LVOT/AV VTI ratio: 0.65  AORTA Ao Root diam: 3.00 cm Ao Asc diam:  2.60 cm MITRAL VALVE               TRICUSPID VALVE MV Area (PHT): 2.71 cm    TR Peak grad:   35.0 mmHg MV Decel Time: 280 msec    TR Vmax:        296.00 cm/s MV E velocity: 82.70 cm/s MV Nylene Inlow velocity: 97.70 cm/s  SHUNTS MV E/Abraham Margulies ratio:  0.85        Systemic VTI:  0.23 m                            Systemic Diam: 2.20 cm Phineas Inches Electronically signed by Phineas Inches Signature Date/Time: 03/08/2021/1:31:12 PM    Final    VAS [place] LOWER EXTREMITY VENOUS (DVT)  Result Date: 03/08/2021  Lower Venous DVT Study Patient Name:  AUGUSTEN LIPKIN  Date of Exam:   03/08/2021 Medical Rec #: [id number]  Accession #:    [id number] Date of Birth: Oct 31, 1934        Patient Gender: M Patient Age:   89 years Exam Location:  [facility]  Procedure:      VAS [place] LOWER EXTREMITY VENOUS (DVT) Referring Phys: Blessed Girdner POWELL JR --------------------------------------------------------------------------------  Indications: Elevated d-dimer, cellulitis, suspected PE.  Anticoagulation: Heparin. Limitations: Bilateral bandaging of mid-lower calves. Comparison Study: No prior studies. Performing Technologist: Darlin Coco RDMS, RVT  Examination Guidelines: Olando Willems complete evaluation includes B-mode imaging, spectral Doppler, color Doppler, and power Doppler as needed of all accessible portions of each vessel. Bilateral testing is considered an integral part of Pebble Botkin complete examination. Limited examinations for reoccurring indications may be performed as noted. The reflux portion of the exam is performed with the patient in reverse Trendelenburg.  +---------+---------------+---------+-----------+----------+-------------------+  RIGHT     Compressibility Phasicity Spontaneity Properties Thrombus Aging       +---------+---------------+---------+-----------+----------+-------------------+  CFV       Full            Yes       Yes                                         +---------+---------------+---------+-----------+----------+-------------------+  SFJ       Full                                                                  +---------+---------------+---------+-----------+----------+-------------------+  FV Prox   Full                                                                  +---------+---------------+---------+-----------+----------+-------------------+  FV Mid    Full                                                                  +---------+---------------+---------+-----------+----------+-------------------+  FV Distal Full                                                                  +---------+---------------+---------+-----------+----------+-------------------+  PFV       Full                                                                   +---------+---------------+---------+-----------+----------+-------------------+  POP  Full            Yes       Yes                                         +---------+---------------+---------+-----------+----------+-------------------+  PTV                                                        Limited views of                                                                 proximal segment                                                                 patent by color      +---------+---------------+---------+-----------+----------+-------------------+  PERO                                                       Limited views of                                                                 proximal segment                                                                 patent by color      +---------+---------------+---------+-----------+----------+-------------------+   +---------+---------------+---------+-----------+----------+-------------------+  LEFT      Compressibility Phasicity Spontaneity Properties Thrombus Aging       +---------+---------------+---------+-----------+----------+-------------------+  CFV       Full            Yes       Yes                                         +---------+---------------+---------+-----------+----------+-------------------+  SFJ       Full                                                                  +---------+---------------+---------+-----------+----------+-------------------+  FV Prox   Full                                                                  +---------+---------------+---------+-----------+----------+-------------------+  FV Mid    Full                                                                  +---------+---------------+---------+-----------+----------+-------------------+  FV Distal Full                                                                  +---------+---------------+---------+-----------+----------+-------------------+   PFV       Full                                                                  +---------+---------------+---------+-----------+----------+-------------------+  POP       Full            Yes       Yes                                         +---------+---------------+---------+-----------+----------+-------------------+  PTV                                                        Limited views of                                                                 proximal segment                                                                 patent by color      +---------+---------------+---------+-----------+----------+-------------------+  PERO  Limited views of                                                                 proximal segment                                                                 patent by color      +---------+---------------+---------+-----------+----------+-------------------+     Summary: RIGHT: - There is no evidence of deep vein thrombosis in the lower extremity. However, portions of this examination were limited- see technologist comments above.  - No cystic structure found in the popliteal fossa.  LEFT: - There is no evidence of deep vein thrombosis in the lower extremity. However, portions of this examination were limited- see technologist comments above.  - No cystic structure found in the popliteal fossa.  *See table(s) above for measurements and observations. Electronically signed by Jamelle Haring on 03/08/2021 at 3:14:03 PM.    Final     Microbiology: Recent Results (from the past 240 hour(s))  Resp Panel by RT-PCR (Flu Fidencia Mccloud&B, Covid) Nasopharyngeal Swab     Status: None   Collection Time: 03/04/21 12:56 PM   Specimen: Nasopharyngeal Swab; Nasopharyngeal(NP) swabs in vial transport medium  Result Value Ref Range Status   SARS Coronavirus 2 by RT PCR NEGATIVE NEGATIVE Final    Comment: (NOTE) SARS-CoV-2 target nucleic  acids are NOT DETECTED.  The SARS-CoV-2 RNA is generally detectable in upper respiratory specimens during the acute phase of infection. The lowest concentration of SARS-CoV-2 viral copies this assay can detect is 138 copies/mL. Muneeb Veras negative result does not preclude SARS-Cov-2 infection and should not be used as the sole basis for treatment or other patient management decisions. Ascher Schroepfer negative result may occur with  improper specimen collection/handling, submission of specimen other than nasopharyngeal swab, presence of viral mutation(s) within the areas targeted by this assay, and inadequate number of viral copies(<138 copies/mL). Wednesday Ericsson negative result must be combined with clinical observations, patient history, and epidemiological information. The expected result is Negative.  Fact Sheet for Patients:  EntrepreneurPulse.com.au  Fact Sheet for Healthcare Providers:  IncredibleEmployment.be  This test is no t yet approved or cleared by the [place] FDA and  has been authorized for detection and/or diagnosis of SARS-CoV-2 by FDA under an Emergency Use Authorization (EUA). This EUA will remain  in effect (meaning this test can be used) for the duration of the COVID-19 declaration under Section 564(b)(1) of the Act, 21 U.S.C.section 360bbb-3(b)(1), unless the authorization is terminated  or revoked sooner.       Influenza Aayansh Codispoti by PCR NEGATIVE NEGATIVE Final   Influenza B by PCR NEGATIVE NEGATIVE Final    Comment: (NOTE) The Xpert Xpress SARS-CoV-2/FLU/RSV plus assay is intended as an aid in the diagnosis of influenza from Nasopharyngeal swab specimens and should not be used as Elnoria Livingston sole basis for treatment. Nasal washings and aspirates are unacceptable for Xpert Xpress SARS-CoV-2/FLU/RSV testing.  Fact Sheet for Patients: EntrepreneurPulse.com.au  Fact Sheet for Healthcare Providers: IncredibleEmployment.be  This  test is not  yet approved or cleared by the [place] and has been authorized for detection and/or diagnosis of SARS-CoV-2 by FDA under an Emergency Use Authorization (EUA). This EUA will remain in effect (meaning this test can be used) for the duration of the COVID-19 declaration under Section 564(b)(1) of the Act, 21 U.S.C. section 360bbb-3(b)(1), unless the authorization is terminated or revoked.  Performed at [facility], [place] [address] [place], [place] [address]      Labs: Basic Metabolic Panel: Recent Labs  Lab 03/06/21 0406 03/07/21 0412 03/08/21 0509 03/09/21 0416 03/10/21 0357 03/10/21 1236  NA 141 140 141 139 140 139  K 3.7 3.8 3.8 3.9 3.7 3.7  CL 112* 107 114* 112* 115* 114*  CO2 21* 20* 19* 18* 18* 18*  GLUCOSE 93 97 84 89 86 94  BUN 16 14 20 22  27* 28*  CREATININE 2.34* 1.86* 2.18* 2.36* 2.86* 2.93*  CALCIUM 8.2* 8.7* 8.3* 8.5* 8.2* 8.1*  MG 1.5* 1.7 2.0 2.1 2.0  --   PHOS 3.1 2.5 3.3 3.5 3.7  --    Liver Function Tests: Recent Labs  Lab 03/06/21 0406 03/07/21 0412 03/08/21 0509 03/09/21 0416 03/10/21 0357  AST 11* 20 26 26 19   ALT 8 8 6 7 6   ALKPHOS 68 77 70 71 60  BILITOT 0.4 0.6 0.5 0.6 0.7  PROT 6.2* 6.7 6.2* 6.7 5.8*  ALBUMIN 2.2* 2.4* 2.3* 2.5* 2.1*   No results for input(s): LIPASE, AMYLASE in the last 168 hours. No results for input(s): AMMONIA in the last 168 hours. CBC: Recent Labs  Lab 03/06/21 0406 03/07/21 0412 03/08/21 0509 03/09/21 0416 03/10/21 0357  WBC 7.5 11.4* 11.9* 11.6* 9.8  NEUTROABS 5.1 9.6* 9.2*  --  7.1  HGB 10.6* 11.7* 10.3* 11.4* 9.7*  HCT 33.0* 36.1* 32.8* 36.6* 30.3*  MCV 92.4 91.4 93.2 95.6 93.2  PLT 349 406* 376 347 316   Cardiac Enzymes: No results for input(s): CKTOTAL, CKMB, CKMBINDEX, TROPONINI in the last 168 hours. BNP: BNP (last 3 results) Recent Labs    01/01/21 2044 03/07/21 1714 03/08/21 0509  BNP 308.5* 931.2* 915.5*    ProBNP (last 3 results) No results  for input(s): PROBNP in the last 8760 hours.  CBG: No results for input(s): GLUCAP in the last 168 hours.     Signed:  Fayrene Helper MD.  [facility] 03/11/2021, 10:45 PM

## 2021-03-11 NOTE — Progress Notes (Signed)
Patient walked to the nurse's station from his room, 1304, and had to be wheeled back from being winded. His O2 stayed around 93 percent.

## 2021-03-11 NOTE — Progress Notes (Signed)
Patient was given discharge instructions, and all questions were answered.  Patient was stable for discharge and was taken to the main exit by wheelchair. 

## 2021-03-11 NOTE — Plan of Care (Signed)
  Problem: Coping: Goal: Level of anxiety will decrease Outcome: Progressing   Problem: Pain Managment: Goal: General experience of comfort will improve Outcome: Progressing   

## 2021-03-11 NOTE — TOC Transition Note (Signed)
Transition of Care [facility]) - CM/SW Discharge Note   Patient Details  Name: Wayne Meadows MRN: [id number] Date of Birth: Jun 17, 1934  [facility] CM/SW Contact:  Roseanne Kaufman, RN Phone Number: 03/11/2021, 5:39 PM   Clinical Narrative:     Patient being seen inpatient for recurrent cellulitis of lower leg. Patient has family support with son & daughter in-law that will check on him at home. This RNCM received verbal for [place] services: PT/RN/HHA, no TOC consult on file. No additional TOC needs.    Patient Goals and CMS Choice  [name], BSN, RN, [place], [place] [phone] RNCM spoke with pt at bedside regarding discharge planning for [place]. Offered pt medicare.gov list of home health agencies to choose from.  Pt chose [facility] to render services. Corene Cornea of [facility] notified. Patient made aware that [facility] will be in contact in 24-48 hours ( once the [place] authorization is approved).  No DME needs identified at this time.    Discharge Placement  Safe discharge to home             Discharge Plan and [place] [facility] PT, RN, HHA            Social Determinants of Health (SDOH) Interventions     Readmission Risk Interventions No flowsheet data found.

## 2021-03-17 ENCOUNTER — Encounter (HOSPITAL_COMMUNITY): Payer: Self-pay

## 2021-03-17 ENCOUNTER — Other Ambulatory Visit: Payer: Self-pay

## 2021-03-17 ENCOUNTER — Inpatient Hospital Stay (HOSPITAL_COMMUNITY)
Admission: EM | Admit: 2021-03-17 | Discharge: 2021-04-04 | DRG: 674 | Disposition: A | Discharge status: Skilled Nursing Facility | Payer: No Typology Code available for payment source | Attending: Internal Medicine | Admitting: Internal Medicine

## 2021-03-17 DIAGNOSIS — N179 Acute kidney failure, unspecified: Secondary | ICD-10-CM | POA: Diagnosis not present

## 2021-03-17 DIAGNOSIS — R112 Nausea with vomiting, unspecified: Secondary | ICD-10-CM

## 2021-03-17 DIAGNOSIS — E039 Hypothyroidism, unspecified: Secondary | ICD-10-CM | POA: Diagnosis present

## 2021-03-17 DIAGNOSIS — R2689 Other abnormalities of gait and mobility: Secondary | ICD-10-CM | POA: Diagnosis not present

## 2021-03-17 DIAGNOSIS — Z955 Presence of coronary angioplasty implant and graft: Secondary | ICD-10-CM

## 2021-03-17 DIAGNOSIS — R7989 Other specified abnormal findings of blood chemistry: Secondary | ICD-10-CM

## 2021-03-17 DIAGNOSIS — R000 Tachycardia, unspecified: Secondary | ICD-10-CM | POA: Diagnosis not present

## 2021-03-17 DIAGNOSIS — Z887 Allergy status to serum and vaccine status: Secondary | ICD-10-CM

## 2021-03-17 DIAGNOSIS — I10 Essential (primary) hypertension: Secondary | ICD-10-CM | POA: Diagnosis not present

## 2021-03-17 DIAGNOSIS — N186 End stage renal disease: Secondary | ICD-10-CM | POA: Diagnosis present

## 2021-03-17 DIAGNOSIS — D631 Anemia in chronic kidney disease: Secondary | ICD-10-CM | POA: Diagnosis present

## 2021-03-17 DIAGNOSIS — I132 Hypertensive heart and chronic kidney disease with heart failure and with stage 5 chronic kidney disease, or end stage renal disease: Secondary | ICD-10-CM | POA: Diagnosis present

## 2021-03-17 DIAGNOSIS — E1122 Type 2 diabetes mellitus with diabetic chronic kidney disease: Secondary | ICD-10-CM | POA: Diagnosis present

## 2021-03-17 DIAGNOSIS — Z743 Need for continuous supervision: Secondary | ICD-10-CM | POA: Diagnosis not present

## 2021-03-17 DIAGNOSIS — Z7902 Long term (current) use of antithrombotics/antiplatelets: Secondary | ICD-10-CM

## 2021-03-17 DIAGNOSIS — C649 Malignant neoplasm of unspecified kidney, except renal pelvis: Secondary | ICD-10-CM | POA: Diagnosis present

## 2021-03-17 DIAGNOSIS — I480 Paroxysmal atrial fibrillation: Secondary | ICD-10-CM | POA: Diagnosis present

## 2021-03-17 DIAGNOSIS — E8770 Fluid overload, unspecified: Secondary | ICD-10-CM | POA: Diagnosis present

## 2021-03-17 DIAGNOSIS — Z85528 Personal history of other malignant neoplasm of kidney: Secondary | ICD-10-CM

## 2021-03-17 DIAGNOSIS — Z20822 Contact with and (suspected) exposure to covid-19: Secondary | ICD-10-CM | POA: Diagnosis present

## 2021-03-17 DIAGNOSIS — J449 Chronic obstructive pulmonary disease, unspecified: Secondary | ICD-10-CM | POA: Diagnosis present

## 2021-03-17 DIAGNOSIS — J441 Chronic obstructive pulmonary disease with (acute) exacerbation: Secondary | ICD-10-CM | POA: Diagnosis present

## 2021-03-17 DIAGNOSIS — J432 Centrilobular emphysema: Secondary | ICD-10-CM | POA: Diagnosis present

## 2021-03-17 DIAGNOSIS — Z87891 Personal history of nicotine dependence: Secondary | ICD-10-CM

## 2021-03-17 DIAGNOSIS — I2510 Atherosclerotic heart disease of native coronary artery without angina pectoris: Secondary | ICD-10-CM | POA: Diagnosis present

## 2021-03-17 DIAGNOSIS — Z79899 Other long term (current) drug therapy: Secondary | ICD-10-CM

## 2021-03-17 DIAGNOSIS — E872 Acidosis, unspecified: Secondary | ICD-10-CM | POA: Diagnosis present

## 2021-03-17 DIAGNOSIS — R339 Retention of urine, unspecified: Secondary | ICD-10-CM | POA: Diagnosis present

## 2021-03-17 DIAGNOSIS — Z8673 Personal history of transient ischemic attack (TIA), and cerebral infarction without residual deficits: Secondary | ICD-10-CM

## 2021-03-17 DIAGNOSIS — Z992 Dependence on renal dialysis: Secondary | ICD-10-CM

## 2021-03-17 DIAGNOSIS — I252 Old myocardial infarction: Secondary | ICD-10-CM

## 2021-03-17 DIAGNOSIS — Z79890 Hormone replacement therapy (postmenopausal): Secondary | ICD-10-CM

## 2021-03-17 DIAGNOSIS — R531 Weakness: Secondary | ICD-10-CM | POA: Diagnosis not present

## 2021-03-17 DIAGNOSIS — I872 Venous insufficiency (chronic) (peripheral): Secondary | ICD-10-CM | POA: Diagnosis present

## 2021-03-17 DIAGNOSIS — I82711 Chronic embolism and thrombosis of superficial veins of right upper extremity: Secondary | ICD-10-CM | POA: Diagnosis present

## 2021-03-17 DIAGNOSIS — Z7982 Long term (current) use of aspirin: Secondary | ICD-10-CM

## 2021-03-17 DIAGNOSIS — Z8249 Family history of ischemic heart disease and other diseases of the circulatory system: Secondary | ICD-10-CM

## 2021-03-17 DIAGNOSIS — Z91030 Bee allergy status: Secondary | ICD-10-CM

## 2021-03-17 DIAGNOSIS — I4891 Unspecified atrial fibrillation: Secondary | ICD-10-CM | POA: Diagnosis present

## 2021-03-17 DIAGNOSIS — M898X9 Other specified disorders of bone, unspecified site: Secondary | ICD-10-CM | POA: Diagnosis present

## 2021-03-17 DIAGNOSIS — L8990 Pressure ulcer of unspecified site, unspecified stage: Secondary | ICD-10-CM | POA: Insufficient documentation

## 2021-03-17 LAB — HEPATIC FUNCTION PANEL
ALT: 10 U/L (ref 0–44)
AST: 15 U/L (ref 15–41)
Albumin: 2.5 g/dL — ABNORMAL LOW (ref 3.5–5.0)
Alkaline Phosphatase: 75 U/L (ref 38–126)
Bilirubin, Direct: 0.1 mg/dL (ref 0.0–0.2)
Indirect Bilirubin: 0.5 mg/dL (ref 0.3–0.9)
Total Bilirubin: 0.6 mg/dL (ref 0.3–1.2)
Total Protein: 6.6 g/dL (ref 6.5–8.1)

## 2021-03-17 LAB — RESP PANEL BY RT-PCR (FLU A&B, COVID) ARPGX2
Influenza A by PCR: NEGATIVE
Influenza B by PCR: NEGATIVE
SARS Coronavirus 2 by RT PCR: NEGATIVE

## 2021-03-17 LAB — BASIC METABOLIC PANEL
Anion gap: 7 (ref 5–15)
BUN: 57 mg/dL — ABNORMAL HIGH (ref 8–23)
CO2: 17 mmol/L — ABNORMAL LOW (ref 22–32)
Calcium: 8.3 mg/dL — ABNORMAL LOW (ref 8.9–10.3)
Chloride: 115 mmol/L — ABNORMAL HIGH (ref 98–111)
Creatinine, Ser: 4.87 mg/dL — ABNORMAL HIGH (ref 0.61–1.24)
GFR, Estimated: 11 mL/min — ABNORMAL LOW (ref >60)
Glucose, Bld: 111 mg/dL — ABNORMAL HIGH (ref 70–99)
Potassium: 4.5 mmol/L (ref 3.5–5.1)
Sodium: 139 mmol/L (ref 135–145)

## 2021-03-17 LAB — MAGNESIUM: Magnesium: 2.3 mg/dL (ref 1.7–2.4)

## 2021-03-17 LAB — CBC WITH DIFFERENTIAL/PLATELET
Abs Immature Granulocytes: 0.05 10*3/uL (ref 0.00–0.07)
Basophils Absolute: 0.1 10*3/uL (ref 0.0–0.1)
Basophils Relative: 1 %
Eosinophils Absolute: 0.3 10*3/uL (ref 0.0–0.5)
Eosinophils Relative: 3 %
HCT: 31.3 % — ABNORMAL LOW (ref 39.0–52.0)
Hemoglobin: 9.9 g/dL — ABNORMAL LOW (ref 13.0–17.0)
Immature Granulocytes: 1 %
Lymphocytes Relative: 14 %
Lymphs Abs: 1.4 10*3/uL (ref 0.7–4.0)
MCH: 29.7 pg (ref 26.0–34.0)
MCHC: 31.6 g/dL (ref 30.0–36.0)
MCV: 94 fL (ref 80.0–100.0)
Monocytes Absolute: 0.9 10*3/uL (ref 0.1–1.0)
Monocytes Relative: 9 %
Neutro Abs: 7.1 10*3/uL (ref 1.7–7.7)
Neutrophils Relative %: 72 %
Platelets: 295 10*3/uL (ref 150–400)
RBC: 3.33 MIL/uL — ABNORMAL LOW (ref 4.22–5.81)
RDW: 15.8 % — ABNORMAL HIGH (ref 11.5–15.5)
WBC: 9.8 10*3/uL (ref 4.0–10.5)
nRBC: 0 % (ref 0.0–0.2)

## 2021-03-17 LAB — BRAIN NATRIURETIC PEPTIDE: B Natriuretic Peptide: 280.9 pg/mL — ABNORMAL HIGH (ref 0.0–100.0)

## 2021-03-17 NOTE — ED Triage Notes (Signed)
BIB by [facility] from home after a recent admission for BIL LE cellulitis. Pt now unable to stand and walk r/t weakness in his LE. Pt has been compliant with prescribed abx. Pt also complaining of abd distention. 220/100

## 2021-03-18 ENCOUNTER — Encounter (HOSPITAL_COMMUNITY): Payer: Self-pay | Admitting: Internal Medicine

## 2021-03-18 ENCOUNTER — Observation Stay (HOSPITAL_COMMUNITY): Payer: No Typology Code available for payment source

## 2021-03-18 DIAGNOSIS — N179 Acute kidney failure, unspecified: Secondary | ICD-10-CM | POA: Diagnosis not present

## 2021-03-18 DIAGNOSIS — I252 Old myocardial infarction: Secondary | ICD-10-CM | POA: Diagnosis not present

## 2021-03-18 DIAGNOSIS — R531 Weakness: Secondary | ICD-10-CM | POA: Diagnosis not present

## 2021-03-18 DIAGNOSIS — N281 Cyst of kidney, acquired: Secondary | ICD-10-CM | POA: Diagnosis not present

## 2021-03-18 DIAGNOSIS — N186 End stage renal disease: Secondary | ICD-10-CM | POA: Diagnosis not present

## 2021-03-18 DIAGNOSIS — Z79899 Other long term (current) drug therapy: Secondary | ICD-10-CM | POA: Diagnosis not present

## 2021-03-18 DIAGNOSIS — J439 Emphysema, unspecified: Secondary | ICD-10-CM | POA: Diagnosis not present

## 2021-03-18 DIAGNOSIS — J432 Centrilobular emphysema: Secondary | ICD-10-CM

## 2021-03-18 DIAGNOSIS — R140 Abdominal distension (gaseous): Secondary | ICD-10-CM | POA: Diagnosis not present

## 2021-03-18 DIAGNOSIS — Z87891 Personal history of nicotine dependence: Secondary | ICD-10-CM | POA: Diagnosis not present

## 2021-03-18 DIAGNOSIS — J90 Pleural effusion, not elsewhere classified: Secondary | ICD-10-CM | POA: Diagnosis not present

## 2021-03-18 DIAGNOSIS — Z8673 Personal history of transient ischemic attack (TIA), and cerebral infarction without residual deficits: Secondary | ICD-10-CM | POA: Diagnosis not present

## 2021-03-18 DIAGNOSIS — Z7982 Long term (current) use of aspirin: Secondary | ICD-10-CM | POA: Diagnosis not present

## 2021-03-18 DIAGNOSIS — M6281 Muscle weakness (generalized): Secondary | ICD-10-CM | POA: Diagnosis not present

## 2021-03-18 DIAGNOSIS — I739 Peripheral vascular disease, unspecified: Secondary | ICD-10-CM | POA: Diagnosis not present

## 2021-03-18 DIAGNOSIS — I2510 Atherosclerotic heart disease of native coronary artery without angina pectoris: Secondary | ICD-10-CM | POA: Diagnosis not present

## 2021-03-18 DIAGNOSIS — I132 Hypertensive heart and chronic kidney disease with heart failure and with stage 5 chronic kidney disease, or end stage renal disease: Secondary | ICD-10-CM | POA: Diagnosis present

## 2021-03-18 DIAGNOSIS — J449 Chronic obstructive pulmonary disease, unspecified: Secondary | ICD-10-CM

## 2021-03-18 DIAGNOSIS — I10 Essential (primary) hypertension: Secondary | ICD-10-CM | POA: Diagnosis not present

## 2021-03-18 DIAGNOSIS — K6389 Other specified diseases of intestine: Secondary | ICD-10-CM | POA: Diagnosis not present

## 2021-03-18 DIAGNOSIS — L03116 Cellulitis of left lower limb: Secondary | ICD-10-CM | POA: Diagnosis not present

## 2021-03-18 DIAGNOSIS — I82711 Chronic embolism and thrombosis of superficial veins of right upper extremity: Secondary | ICD-10-CM | POA: Diagnosis present

## 2021-03-18 DIAGNOSIS — E8770 Fluid overload, unspecified: Secondary | ICD-10-CM | POA: Diagnosis present

## 2021-03-18 DIAGNOSIS — I4891 Unspecified atrial fibrillation: Secondary | ICD-10-CM

## 2021-03-18 DIAGNOSIS — R339 Retention of urine, unspecified: Secondary | ICD-10-CM | POA: Diagnosis present

## 2021-03-18 DIAGNOSIS — Z7401 Bed confinement status: Secondary | ICD-10-CM | POA: Diagnosis not present

## 2021-03-18 DIAGNOSIS — Z992 Dependence on renal dialysis: Secondary | ICD-10-CM | POA: Diagnosis not present

## 2021-03-18 DIAGNOSIS — I872 Venous insufficiency (chronic) (peripheral): Secondary | ICD-10-CM | POA: Diagnosis present

## 2021-03-18 DIAGNOSIS — E039 Hypothyroidism, unspecified: Secondary | ICD-10-CM | POA: Diagnosis not present

## 2021-03-18 DIAGNOSIS — L03115 Cellulitis of right lower limb: Secondary | ICD-10-CM | POA: Diagnosis not present

## 2021-03-18 DIAGNOSIS — Z955 Presence of coronary angioplasty implant and graft: Secondary | ICD-10-CM | POA: Diagnosis not present

## 2021-03-18 DIAGNOSIS — Z981 Arthrodesis status: Secondary | ICD-10-CM | POA: Diagnosis not present

## 2021-03-18 DIAGNOSIS — C649 Malignant neoplasm of unspecified kidney, except renal pelvis: Secondary | ICD-10-CM

## 2021-03-18 DIAGNOSIS — Z20822 Contact with and (suspected) exposure to covid-19: Secondary | ICD-10-CM | POA: Diagnosis present

## 2021-03-18 DIAGNOSIS — E872 Acidosis, unspecified: Secondary | ICD-10-CM | POA: Diagnosis present

## 2021-03-18 DIAGNOSIS — E1122 Type 2 diabetes mellitus with diabetic chronic kidney disease: Secondary | ICD-10-CM | POA: Diagnosis not present

## 2021-03-18 DIAGNOSIS — I679 Cerebrovascular disease, unspecified: Secondary | ICD-10-CM | POA: Diagnosis not present

## 2021-03-18 DIAGNOSIS — D631 Anemia in chronic kidney disease: Secondary | ICD-10-CM | POA: Diagnosis not present

## 2021-03-18 DIAGNOSIS — N189 Chronic kidney disease, unspecified: Secondary | ICD-10-CM | POA: Diagnosis not present

## 2021-03-18 DIAGNOSIS — M898X9 Other specified disorders of bone, unspecified site: Secondary | ICD-10-CM | POA: Diagnosis present

## 2021-03-18 LAB — CBC
HCT: 30.2 % — ABNORMAL LOW (ref 39.0–52.0)
Hemoglobin: 9.7 g/dL — ABNORMAL LOW (ref 13.0–17.0)
MCH: 29.9 pg (ref 26.0–34.0)
MCHC: 32.1 g/dL (ref 30.0–36.0)
MCV: 93.2 fL (ref 80.0–100.0)
Platelets: 258 10*3/uL (ref 150–400)
RBC: 3.24 MIL/uL — ABNORMAL LOW (ref 4.22–5.81)
RDW: 15.8 % — ABNORMAL HIGH (ref 11.5–15.5)
WBC: 9.4 10*3/uL (ref 4.0–10.5)
nRBC: 0 % (ref 0.0–0.2)

## 2021-03-18 LAB — BASIC METABOLIC PANEL
Anion gap: 8 (ref 5–15)
BUN: 59 mg/dL — ABNORMAL HIGH (ref 8–23)
CO2: 17 mmol/L — ABNORMAL LOW (ref 22–32)
Calcium: 8.1 mg/dL — ABNORMAL LOW (ref 8.9–10.3)
Chloride: 115 mmol/L — ABNORMAL HIGH (ref 98–111)
Creatinine, Ser: 4.93 mg/dL — ABNORMAL HIGH (ref 0.61–1.24)
GFR, Estimated: 11 mL/min — ABNORMAL LOW (ref >60)
Glucose, Bld: 103 mg/dL — ABNORMAL HIGH (ref 70–99)
Potassium: 4.3 mmol/L (ref 3.5–5.1)
Sodium: 140 mmol/L (ref 135–145)

## 2021-03-18 LAB — CK: Total CK: 44 U/L — ABNORMAL LOW (ref 49–397)

## 2021-03-18 MED ORDER — HYDROCODONE-ACETAMINOPHEN 10-325 MG PO TABS: 0.5000 | ORAL_TABLET | Freq: Once | ORAL | Status: DC | PRN

## 2021-03-18 MED ORDER — ACETAMINOPHEN 325 MG PO TABS
650.0000 mg | ORAL_TABLET | Freq: Four times a day (QID) | ORAL | Status: DC | PRN
  Administered 2021-03-20 – 2021-04-04 (×9): 650 mg via ORAL
  Filled 2021-03-18 (×9): qty 2

## 2021-03-18 MED ORDER — HEPARIN SODIUM (PORCINE) 5000 UNIT/ML IJ SOLN
5000.0000 [IU] | Freq: Three times a day (TID) | INTRAMUSCULAR | Status: DC
  Administered 2021-03-18 – 2021-04-04 (×49): 5000 [IU] via SUBCUTANEOUS
  Filled 2021-03-18 (×50): qty 1

## 2021-03-18 MED ORDER — CLONIDINE HCL 0.1 MG PO TABS
0.1000 mg | ORAL_TABLET | Freq: Every day | ORAL | Status: DC
  Administered 2021-03-18 – 2021-04-04 (×17): 0.1 mg via ORAL
  Filled 2021-03-18 (×17): qty 1

## 2021-03-18 MED ORDER — ALBUTEROL SULFATE (2.5 MG/3ML) 0.083% IN NEBU
2.5000 mg | INHALATION_SOLUTION | Freq: Four times a day (QID) | RESPIRATORY_TRACT | Status: DC | PRN
  Administered 2021-03-18 – 2021-03-24 (×3): 2.5 mg via RESPIRATORY_TRACT
  Filled 2021-03-18 (×3): qty 3

## 2021-03-18 MED ORDER — HYDROCODONE-ACETAMINOPHEN 10-325 MG PO TABS
1.0000 | ORAL_TABLET | Freq: Two times a day (BID) | ORAL | Status: DC | PRN
  Administered 2021-03-19 – 2021-04-04 (×24): 1 via ORAL
  Filled 2021-03-18 (×24): qty 1

## 2021-03-18 MED ORDER — LEVOTHYROXINE SODIUM 50 MCG PO TABS
50.0000 ug | ORAL_TABLET | Freq: Every day | ORAL | Status: DC
  Administered 2021-03-18 – 2021-04-04 (×17): 50 ug via ORAL
  Filled 2021-03-18 (×18): qty 1

## 2021-03-18 MED ORDER — PRAVASTATIN SODIUM 40 MG PO TABS
40.0000 mg | ORAL_TABLET | Freq: Every day | ORAL | Status: DC
  Administered 2021-03-18 – 2021-04-03 (×18): 40 mg via ORAL
  Filled 2021-03-18 (×9): qty 1
  Filled 2021-03-18: qty 2
  Filled 2021-03-18 (×2): qty 1
  Filled 2021-03-18: qty 2
  Filled 2021-03-18 (×5): qty 1

## 2021-03-18 MED ORDER — HYDROCODONE-ACETAMINOPHEN 10-325 MG PO TABS
0.5000 | ORAL_TABLET | Freq: Two times a day (BID) | ORAL | Status: DC | PRN
  Administered 2021-03-18: 20:00:00 0.5 via ORAL
  Filled 2021-03-18: qty 1

## 2021-03-18 MED ORDER — ALBUTEROL SULFATE (2.5 MG/3ML) 0.083% IN NEBU
2.5000 mg | INHALATION_SOLUTION | Freq: Four times a day (QID) | RESPIRATORY_TRACT | Status: DC
  Administered 2021-03-19 (×4): 2.5 mg via RESPIRATORY_TRACT
  Filled 2021-03-18 (×4): qty 3

## 2021-03-18 MED ORDER — SODIUM CHLORIDE 0.9 % IV SOLN
2.0000 g | INTRAVENOUS | Status: DC
  Administered 2021-03-18 – 2021-03-20 (×3): 2 g via INTRAVENOUS
  Filled 2021-03-18 (×3): qty 20

## 2021-03-18 MED ORDER — ASPIRIN EC 81 MG PO TBEC
81.0000 mg | DELAYED_RELEASE_TABLET | Freq: Every day | ORAL | Status: DC
  Administered 2021-03-18 – 2021-04-04 (×17): 81 mg via ORAL
  Filled 2021-03-18 (×17): qty 1

## 2021-03-18 MED ORDER — HYDROCERIN EX CREA
TOPICAL_CREAM | CUTANEOUS | Status: DC
  Filled 2021-03-18 (×3): qty 113

## 2021-03-18 MED ORDER — METOPROLOL TARTRATE 25 MG PO TABS
25.0000 mg | ORAL_TABLET | Freq: Every day | ORAL | Status: DC
  Administered 2021-03-18 – 2021-04-04 (×18): 25 mg via ORAL
  Filled 2021-03-18 (×18): qty 1

## 2021-03-18 MED ORDER — FERROUS SULFATE 325 (65 FE) MG PO TABS
325.0000 mg | ORAL_TABLET | Freq: Two times a day (BID) | ORAL | Status: DC
  Administered 2021-03-18 – 2021-03-29 (×18): 325 mg via ORAL
  Filled 2021-03-18 (×32): qty 1

## 2021-03-18 MED ORDER — HYDROCODONE-ACETAMINOPHEN 10-325 MG PO TABS
0.5000 | ORAL_TABLET | Freq: Once | ORAL | Status: AC
Start: 2021-03-18 — End: 2021-03-18
  Administered 2021-03-18: 21:00:00 0.5 via ORAL

## 2021-03-18 MED ORDER — CLOPIDOGREL BISULFATE 75 MG PO TABS
75.0000 mg | ORAL_TABLET | Freq: Every day | ORAL | Status: DC
  Administered 2021-03-18 – 2021-03-28 (×11): 75 mg via ORAL
  Filled 2021-03-18 (×11): qty 1

## 2021-03-18 MED ORDER — AMLODIPINE BESYLATE 10 MG PO TABS
10.0000 mg | ORAL_TABLET | Freq: Every day | ORAL | Status: DC
  Administered 2021-03-18 – 2021-04-04 (×17): 10 mg via ORAL
  Filled 2021-03-18 (×9): qty 1
  Filled 2021-03-18: qty 2
  Filled 2021-03-18 (×7): qty 1

## 2021-03-18 MED ORDER — ACETAMINOPHEN 650 MG RE SUPP: 650.0000 mg | Freq: Four times a day (QID) | RECTAL | Status: DC | PRN

## 2021-03-18 NOTE — H&P (Signed)
History and Physical    Wayne Meadows:[id number] DOB: [birth year] DOA: 03/17/2021  PCP: [name]  Patient coming from: Home.  Chief Complaint: Weakness.  HPI: Wayne Meadows is a 87 y.o. male with history of chronic kidney disease stage III, CVA and CAD, diabetes mellitus, COPD, hypertension was recently admitted for possible cellulitis of the lower extremity discharged on antibiotics presents to the ER because of increasing weakness unable to ambulate with worsening peripheral edema and increasing erythema of the lower extremities.  Denies any nausea vomiting diarrhea or using any NSAIDs.  ED Course: In the ER on exam patient has weeping wounds of the both lower extremity which are appears erythematous.  No signs of any acute ischemic extremities.  Labs show creatinine has worsened from 2.9 it is 4.8.  Patient also states his abdomen is distended but denies any abdominal pain.  Patient admitted for further management of acute on chronic kidney disease stage IV with persistent lower extremity erythema and now having weeping wounds.  COVID test negative.  Review of Systems: As per HPI, rest all negative.   Past Medical History:  Diagnosis Date   Arthritis    Carotid stenosis    COPD (chronic obstructive pulmonary disease) ([place])    Coronary artery disease    S/p PCI 2011;  NSTEMI 12/12:  LHC/PCI 02/23/11: LAD 60% after the septal perforator, D1 occluded with distal collaterals, proximal RI 30-40%, AV circumflex stent patent with 60% stenosis after the stent, RCA 99%, EF 60-65%.  His RCA was treated with a bare-metal stent   CVA (cerebral infarction) 2011   Right cerebral; total obstruction of the right ICA   Diverticulitis    Hypertension    Rectal bleeding 07/2015   Renal carcinoma (HCC)    Stroke ([place])    Tobacco abuse, in remission     Past Surgical History:  Procedure Laterality Date   COLON SURGERY     COLONOSCOPY N/A 08/22/2015   Procedure:  COLONOSCOPY;  Surgeon: [name], MD;  Location: [facility] ENDOSCOPY;  Service: Endoscopy;  Laterality: N/A;   ESOPHAGOGASTRODUODENOSCOPY N/A 07/18/2020   Procedure: ESOPHAGOGASTRODUODENOSCOPY (EGD);  Surgeon: Milus Banister, MD;  Location: [facility];  Service: Endoscopy;  Laterality: N/A;   ESOPHAGOGASTRODUODENOSCOPY (EGD) WITH PROPOFOL N/A 06/21/2020   Procedure: ESOPHAGOGASTRODUODENOSCOPY (EGD) WITH PROPOFOL;  Surgeon: Irene Shipper, MD;  Location[facility] ENDOSCOPY;  Service: Endoscopy;  Laterality: N/A;   EUS N/A 07/18/2020   Procedure: UPPER ENDOSCOPIC ULTRASOUND (EUS) RADIAL;  Surgeon: Milus Banister, MD;  Location: [facility] ENDOSCOPY;  Service: Endoscopy;  Laterality: N/A;   FINE NEEDLE ASPIRATION N/A 07/18/2020   Procedure: FINE NEEDLE ASPIRATION (FNA) LINEAR;  Surgeon: Milus Banister, MD;  Location: [facility] ENDOSCOPY;  Service: Endoscopy;  Laterality: N/A;   hip relacement     KIDNEY SURGERY     LEFT HEART CATH AND CORONARY ANGIOGRAPHY N/A 07/09/2020   Procedure: LEFT HEART CATH AND CORONARY ANGIOGRAPHY;  Surgeon: Leonie Man, MD;  Location: [place] CV LAB;  Service: Cardiovascular;  Laterality: N/A;   LEFT HEART CATHETERIZATION WITH CORONARY ANGIOGRAM N/A 02/23/2011   Procedure: LEFT HEART CATHETERIZATION WITH CORONARY ANGIOGRAM;  Surgeon: [name], MD;  Location: [facility] CATH LAB;  Service: Cardiovascular;  Laterality: N/A;   LEFT HEART CATHETERIZATION WITH CORONARY ANGIOGRAM N/A 03/18/2011   Procedure: LEFT HEART CATHETERIZATION WITH CORONARY ANGIOGRAM;  Surgeon: [name], MD;  Location: [facility] CATH LAB;  Service: Cardiovascular;  Laterality: N/A;   PERCUTANEOUS  CORONARY STENT INTERVENTION (PCI-S) N/A 02/23/2011   Procedure: PERCUTANEOUS CORONARY STENT INTERVENTION (PCI-S);  Surgeon: [name], MD;  Location: [facility] CATH LAB;  Service: Cardiovascular;  Laterality: N/A;   TEMPORARY PACEMAKER INSERTION N/A 02/23/2011   Procedure: TEMPORARY PACEMAKER INSERTION;  Surgeon: [name], MD;  Location: [facility] CATH LAB;  Service: Cardiovascular;  Laterality: N/A;     reports that he quit smoking about 14 years ago. His smoking use included cigarettes. He has never used smokeless tobacco. He reports that he does not currently use alcohol after a past usage of about 1.0 standard drink per week. He reports that he does not use drugs.  Allergies  Allergen Reactions   Bee Venom Anaphylaxis    Has epi pen   Influenza Vaccines Other (See Comments)    "Mortally sick for 2 weeks"    Family History  Problem Relation Age of Onset   Heart attack Other 88    Prior to Admission medications   Medication Sig Start Date End Date Taking? Authorizing Provider  amLODipine (NORVASC) 10 MG tablet Take 1 tablet (10 mg total) by mouth daily. 07/11/20  Yes Margie Billet, NP  aspirin EC 81 MG tablet Take 81 mg by mouth daily.   Yes [provider]  baclofen (LIORESAL) 10 MG tablet Take 10 mg by mouth at bedtime.   Yes [provider]  cloNIDine (CATAPRES) 0.1 MG tablet Take 1 tablet (0.1 mg total) by mouth daily. 06/24/20  Yes Aslam, Loralyn Freshwater, MD  clopidogrel (PLAVIX) 75 MG tablet Take 75 mg by mouth daily.   Yes [provider]  ferrous sulfate 325 (65 FE) MG tablet Take 1 tablet by mouth. Every Monday, Wednesday, and Friday 07/30/20  Yes [provider]  furosemide (LASIX) 40 MG tablet Take 40 mg by mouth 2 (two) times daily. 03/14/21  Yes [provider]  gabapentin (NEURONTIN) 300 MG capsule Take 300-600 mg by mouth See admin instructions. Take 300 mg by mouth in the morning then 300 mg at noon then 600 mg at bedtime   Yes [provider]  HYDROcodone-acetaminophen (NORCO) 10-325 MG tablet Take 1 tablet by mouth 2 (two) times daily.   Yes [provider]  Ipratropium-Albuterol (COMBIVENT) 20-100 MCG/ACT AERS respimat Inhale 1 puff into the lungs 4 (four) times daily.   Yes [provider]  levothyroxine (SYNTHROID) 50 MCG tablet  Take 50 mcg by mouth daily before breakfast.   Yes [provider]  metoprolol tartrate (LOPRESSOR) 25 MG tablet Take 25 mg by mouth daily.   Yes [provider]  Multiple Vitamin (MULTIVITAMIN) tablet Take 1 tablet by mouth daily.   Yes [provider]  polyethylene glycol (MIRALAX / GLYCOLAX) 17 g packet Take 17 g by mouth daily as needed for mild constipation or moderate constipation. 08/15/20  Yes [name], MD  pravastatin (PRAVACHOL) 40 MG tablet Take 40 mg by mouth at bedtime.   Yes [provider]  albuterol (VENTOLIN HFA) 108 (90 Base) MCG/ACT inhaler Inhale 1 puff into the lungs every 6 (six) hours as needed for shortness of breath.    [provider]  diclofenac Sodium (VOLTAREN) 1 % GEL Apply 2 g topically 4 (four) times daily. Patient not taking: Reported on 03/04/2021 06/23/20   Harvie Heck, MD  EPINEPHrine (EPI-PEN) 0.3 mg/0.3 mL DEVI Inject 0.3 mg into the muscle once as needed (for anaphylactic reaction).  Patient not taking: Reported on 03/18/2021    [provider]  LORazepam (ATIVAN) 0.5 MG tablet 1 PO 1 hour before MRI repeat after 30 minutes if needed Patient not taking: Reported on 03/04/2021 08/21/20   [name], MD  nitroGLYCERIN (NITROSTAT) 0.4 MG SL tablet Place 0.4 mg under the tongue every 5 (five) minutes as needed for chest pain. Patient not taking: Reported on 03/18/2021    [provider]    Physical Exam: Constitutional: Moderately built and nourished. Vitals:   03/18/21 0030 03/18/21 0045 03/18/21 0100 03/18/21 0115  BP: (!) 162/70 (!) 162/88 (!) 166/60 (!) 154/65  Pulse: (!) 103  (!) 101   Resp: 18 14 15 15   Temp:      TempSrc:      SpO2: 94%  100%   Weight:      Height:       Eyes: Anicteric no pallor. ENMT: No discharge from the ears eyes nose and mouth. Neck: No mass felt.  No neck rigidity. Respiratory: No rhonchi or crepitations. Cardiovascular: S1-S2 heard. Abdomen: Soft  nontender bowel sound present. Musculoskeletal: Bilateral lower extremity edema and erythema.  Pulses felt. Skin: Erythema of the both lower extremities with weeping wounds. Neurologic: Alert awake oriented time place and person.  Moves all extremities. Psychiatric: Appears normal.  Normal affect.   Labs on Admission: I have personally reviewed following labs and imaging studies  CBC: Recent Labs  Lab 03/17/21 2155  WBC 9.8  NEUTROABS 7.1  HGB 9.9*  HCT 31.3*  MCV 94.0  PLT 629   Basic Metabolic Panel: Recent Labs  Lab 03/17/21 2155  NA 139  K 4.5  CL 115*  CO2 17*  GLUCOSE 111*  BUN 57*  CREATININE 4.87*  CALCIUM 8.3*  MG 2.3   GFR: Estimated Creatinine Clearance: 13.1 mL/min (A) (by C-G formula based on SCr of 4.87 mg/dL (H)). Liver Function Tests: Recent Labs  Lab 03/17/21 2155  AST 15  ALT 10  ALKPHOS 75  BILITOT 0.6  PROT 6.6  ALBUMIN 2.5*   No results for input(s): LIPASE, AMYLASE in the last 168 hours. No results for input(s): AMMONIA in the last 168 hours. Coagulation Profile: No results for input(s): INR, PROTIME in the last 168 hours. Cardiac Enzymes: No results for input(s): CKTOTAL, CKMB, CKMBINDEX, TROPONINI in the last 168 hours. BNP (last 3 results) No results for input(s): PROBNP in the last 8760 hours. HbA1C: No results for input(s): HGBA1C in the last 72 hours. CBG: No results for input(s): GLUCAP in the last 168 hours. Lipid Profile: No results for input(s): CHOL, HDL, LDLCALC, TRIG, CHOLHDL, LDLDIRECT in the last 72 hours. Thyroid Function Tests: No results for input(s): TSH, T4TOTAL, FREET4, T3FREE, THYROIDAB in the last 72 hours. Anemia Panel: No results for input(s): VITAMINB12, FOLATE, FERRITIN, TIBC, IRON, RETICCTPCT in the last 72 hours. Urine analysis:    Component Value Date/Time   COLORURINE YELLOW 03/10/2021 1204   APPEARANCEUR CLOUDY (A) 03/10/2021 1204   LABSPEC 1.020 03/10/2021 1204   PHURINE 5.0 03/10/2021 1204    GLUCOSEU 50 (A) 03/10/2021 1204   HGBUR SMALL (A) 03/10/2021 [place] 03/10/2021 [place] 03/10/2021 1204   PROTEINUR >=300 (A) 03/10/2021 1204   NITRITE NEGATIVE 03/10/2021 1204   LEUKOCYTESUR NEGATIVE 03/10/2021 1204   Sepsis Labs: @LABRCNTIP (procalcitonin:4,lacticidven:4) ) Recent Results (from the past 240 hour(s))  Resp Panel by RT-PCR (Flu A&B, Covid) Nasopharyngeal Swab     Status: None   Collection Time: 03/17/21 10:08 PM   Specimen: Nasopharyngeal Swab; Nasopharyngeal(NP) swabs  in vial transport medium  Result Value Ref Range Status   SARS Coronavirus 2 by RT PCR NEGATIVE NEGATIVE Final    Comment: (NOTE) SARS-CoV-2 target nucleic acids are NOT DETECTED.  The SARS-CoV-2 RNA is generally detectable in upper respiratory specimens during the acute phase of infection. The lowest concentration of SARS-CoV-2 viral copies this assay can detect is 138 copies/mL. A negative result does not preclude SARS-Cov-2 infection and should not be used as the sole basis for treatment or other patient management decisions. A negative result may occur with  improper specimen collection/handling, submission of specimen other than nasopharyngeal swab, presence of viral mutation(s) within the areas targeted by this assay, and inadequate number of viral copies(<138 copies/mL). A negative result must be combined with clinical observations, patient history, and epidemiological information. The expected result is Negative.  Fact Sheet for Patients:  EntrepreneurPulse.com.au  Fact Sheet for Healthcare Providers:  IncredibleEmployment.be  This test is no t yet approved or cleared by the [place] FDA and  has been authorized for detection and/or diagnosis of SARS-CoV-2 by FDA under an Emergency Use Authorization (EUA). This EUA will remain  in effect (meaning this test can be used) for the duration of the COVID-19  declaration under Section 564(b)(1) of the Act, 21 U.S.C.section 360bbb-3(b)(1), unless the authorization is terminated  or revoked sooner.       Influenza A by PCR NEGATIVE NEGATIVE Final   Influenza B by PCR NEGATIVE NEGATIVE Final    Comment: (NOTE) The Xpert Xpress SARS-CoV-2/FLU/RSV plus assay is intended as an aid in the diagnosis of influenza from Nasopharyngeal swab specimens and should not be used as a sole basis for treatment. Nasal washings and aspirates are unacceptable for Xpert Xpress SARS-CoV-2/FLU/RSV testing.  Fact Sheet for Patients: EntrepreneurPulse.com.au  Fact Sheet for Healthcare Providers: IncredibleEmployment.be  This test is not yet approved or cleared by the [place] FDA and has been authorized for detection and/or diagnosis of SARS-CoV-2 by FDA under an Emergency Use Authorization (EUA). This EUA will remain in effect (meaning this test can be used) for the duration of the COVID-19 declaration under Section 564(b)(1) of the Act, 21 U.S.C. section 360bbb-3(b)(1), unless the authorization is terminated or revoked.  Performed at [facility], [place] [address] [place], [place] [address]      Radiological Exams on Admission: No results found.  EKG: Independently reviewed.  Sinus tachycardia.  Assessment/Plan Principal Problem:   Acute renal failure (ARF) (HCC) Active Problems:   CAD (coronary artery disease)   COPD (chronic obstructive pulmonary disease) (HCC)   Atrial fibrillation (HCC)   Renal cell cancer (HCC)   Essential hypertension   Centrilobular emphysema (HCC)   Hypothyroidism   ARF (acute renal failure) (HCC)    Acute on chronic kidney disease stage IV with non-anion gap metabolic acidosis-patient states he still makes urine.  UA is pending.  Recent admission UA showing proteinuria.  Patient has worsening edema now and appears fluid overloaded.  At this point will need  nephrology input.  Check renal ultrasound. Bilateral lower erythema and likely worsened from anasarca Place patient on ceftriaxone get wound team consult.  Recent ABI was normal.  Dopplers were negative for DVT at that time. CAD status post stenting on Plavix and statins.  Takes beta-blockers. Hypertension on amlodipine clonidine and beta-blockers.  As needed IV hydralazine. Hypothyroidism on Synthroid. COPD not actively wheezing. Chronic anemia likely from renal disease follow CBC.  KUB is pending.   DVT prophylaxis: Heparin. Code  Status: Full code. Family Communication: Discussed with patient. Disposition Plan: Home. Consults called: We will need nephrology input. Admission status: Observation.   Rise Patience MD [facility] Pager [phone].  If 7PM-7AM, please contact night-coverage [email or web address] Password [facility]  03/18/2021, 1:59 AM

## 2021-03-18 NOTE — Consult Note (Signed)
[facility] Nurse wound consult note Consultation was completed by review of records, images and assistance from the bedside/clinical staff Reason for Consult: LE wounds Patient known to the [place] nursing team from previous admission, just DC 03/11/21. Reviewed images and notes Patient with normal ABIs 03/06/21; R 1.26/L 1.03 Patient with persistent LE edema and classic signs of venous dermatitis with drainage  Wound type: weeping and ulceration related to venous stasis disease Pressure Injury POA: NA Dressing procedure/placement/frequency:  Add silver hydrofiber for noted excessive drainage, cover with foam to hold place.  Eucerin to remainder of LEs except between toes. Orthopedic tech to apply bilateral Unna's boots when topical care provided by the bedside nurse.  Kellie Simmering # provided for needed supplies for bedside nurse. [place] with provider and bedside staff for care needed and to update admitting provider of decision to proceed with therapeutic compression.  Will need to continue [facility] for Unna's boot changes 2x week with topical care and assessment if DC to home.   Discussed POC with patient and bedside nurse.  Re consult if needed, will not follow at this time. Thanks  Adrina Armijo [employer], RN,CWOCN, CNS, [place] [phone])

## 2021-03-18 NOTE — ED Notes (Signed)
I re-wrapped pt's left ankle with new sterile gauze and wrap due to previous gauze were saturated.

## 2021-03-18 NOTE — Progress Notes (Signed)
Same day note  Patient seen and examined at bedside.  Patient was admitted to the hospital for generalized weakness and leg swelling.  At the time of my evaluation, patient complains of leg swelling and discomfort.  Physical examination reveals elderly male with generalized weakness and bilateral lower extremity edema with dressing.  Laboratory data and imaging was reviewed  Assessment and Plan.  Acute on chronic kidney disease stage IV with non-anion gap metabolic acidosis- Patient still makes urine.   Recent UA showing proteinuria.  Repeat UA pending at this time.  Patient with worsening lower extremity edema.  Communicated with nephrology for consultation.  Patient has worsening edema now and appears fluid overloaded.  Ultrasound of the kidneys showed atrophic and echogenic kidneys suggestive of chronic kidney disease.  Follow-up with nephrology.  Gross bilateral lower extremity edema anasarca/venous insufficiency.  Patient did have a recent ABI which was normal.  Dopplers were negative for DVT at that time.  Patient would benefit from compression of the lower extremities/Unna boots   CAD  History of coronary stent placement.  Continue Plavix statin and beta-blockers.    Essential hypertension  Continue amlodipine clonidine and beta-blockers.  As needed IV hydralazine.  Hypothyroidism Continue Synthroid  COPD  Compensated at this time.  Continue bronchodilators as needed  Chronic anemia likely from renal disease, follow CBC  No Charge  Signed,  Delila Pereyra, MD [facility]

## 2021-03-18 NOTE — ED Notes (Signed)
Placed pt on 2 L/M nasal cannula due to SpO2 readings RA 89-91% RA

## 2021-03-18 NOTE — Progress Notes (Signed)
Upon receiving repot on pt, notified by NT that Sats 80s on RA and wheezing noted. Placed on 2L [place]. Notified RT for Neb tx.  Also c/o pain 7/10 aching in back/chronic since back sx 2007 and a fusion. He states he takes Norco 10mg /325 at home for pain and that he has for "years."  He also c/o needing his inhaler that he uses at home which is combivent and he states he uses it QID. ] Notified J.Olena Heckle, NP.

## 2021-03-19 DIAGNOSIS — J432 Centrilobular emphysema: Secondary | ICD-10-CM | POA: Diagnosis not present

## 2021-03-19 DIAGNOSIS — N179 Acute kidney failure, unspecified: Secondary | ICD-10-CM | POA: Diagnosis not present

## 2021-03-19 DIAGNOSIS — I4891 Unspecified atrial fibrillation: Secondary | ICD-10-CM | POA: Diagnosis not present

## 2021-03-19 DIAGNOSIS — J449 Chronic obstructive pulmonary disease, unspecified: Secondary | ICD-10-CM | POA: Diagnosis not present

## 2021-03-19 DIAGNOSIS — E039 Hypothyroidism, unspecified: Secondary | ICD-10-CM

## 2021-03-19 LAB — CBC
HCT: 29.8 % — ABNORMAL LOW (ref 39.0–52.0)
Hemoglobin: 9.5 g/dL — ABNORMAL LOW (ref 13.0–17.0)
MCH: 29.4 pg (ref 26.0–34.0)
MCHC: 31.9 g/dL (ref 30.0–36.0)
MCV: 92.3 fL (ref 80.0–100.0)
Platelets: 252 10*3/uL (ref 150–400)
RBC: 3.23 MIL/uL — ABNORMAL LOW (ref 4.22–5.81)
RDW: 15.9 % — ABNORMAL HIGH (ref 11.5–15.5)
WBC: 8.3 10*3/uL (ref 4.0–10.5)
nRBC: 0 % (ref 0.0–0.2)

## 2021-03-19 LAB — MAGNESIUM: Magnesium: 2.5 mg/dL — ABNORMAL HIGH (ref 1.7–2.4)

## 2021-03-19 LAB — COMPREHENSIVE METABOLIC PANEL
ALT: 8 U/L (ref 0–44)
AST: 13 U/L — ABNORMAL LOW (ref 15–41)
Albumin: 2.3 g/dL — ABNORMAL LOW (ref 3.5–5.0)
Alkaline Phosphatase: 61 U/L (ref 38–126)
Anion gap: 9 (ref 5–15)
BUN: 66 mg/dL — ABNORMAL HIGH (ref 8–23)
CO2: 17 mmol/L — ABNORMAL LOW (ref 22–32)
Calcium: 8.2 mg/dL — ABNORMAL LOW (ref 8.9–10.3)
Chloride: 114 mmol/L — ABNORMAL HIGH (ref 98–111)
Creatinine, Ser: 4.8 mg/dL — ABNORMAL HIGH (ref 0.61–1.24)
GFR, Estimated: 11 mL/min — ABNORMAL LOW (ref >60)
Glucose, Bld: 118 mg/dL — ABNORMAL HIGH (ref 70–99)
Potassium: 4.4 mmol/L (ref 3.5–5.1)
Sodium: 140 mmol/L (ref 135–145)
Total Bilirubin: 0.5 mg/dL (ref 0.3–1.2)
Total Protein: 6.1 g/dL — ABNORMAL LOW (ref 6.5–8.1)

## 2021-03-19 LAB — URINALYSIS, ROUTINE W REFLEX MICROSCOPIC
Bilirubin Urine: NEGATIVE
Glucose, UA: NEGATIVE mg/dL
Ketones, ur: NEGATIVE mg/dL
Leukocytes,Ua: NEGATIVE
Nitrite: NEGATIVE
Protein, ur: >=300 mg/dL — AB
Specific Gravity, Urine: 1.015 (ref 1.005–1.030)
pH: 5 (ref 5.0–8.0)

## 2021-03-19 LAB — PHOSPHORUS: Phosphorus: 5.3 mg/dL — ABNORMAL HIGH (ref 2.5–4.6)

## 2021-03-19 LAB — SODIUM, URINE, RANDOM: Sodium, Ur: 29 mmol/L

## 2021-03-19 LAB — CREATININE, URINE, RANDOM: Creatinine, Urine: 150.1 mg/dL

## 2021-03-19 MED ORDER — POLYETHYLENE GLYCOL 3350 17 G PO PACK
17.0000 g | PACK | Freq: Every day | ORAL | Status: DC
  Administered 2021-03-19 – 2021-03-27 (×8): 17 g via ORAL
  Filled 2021-03-19 (×12): qty 1

## 2021-03-19 MED ORDER — SIMETHICONE 40 MG/0.6ML PO SUSP
40.0000 mg | Freq: Four times a day (QID) | ORAL | Status: DC | PRN
  Filled 2021-03-19: qty 0.6

## 2021-03-19 MED ORDER — ALBUTEROL SULFATE (2.5 MG/3ML) 0.083% IN NEBU: 2.5000 mg | INHALATION_SOLUTION | Freq: Two times a day (BID) | RESPIRATORY_TRACT | Status: DC

## 2021-03-19 MED ORDER — FUROSEMIDE 10 MG/ML IJ SOLN
80.0000 mg | Freq: Three times a day (TID) | INTRAMUSCULAR | Status: DC
  Administered 2021-03-19 – 2021-03-20 (×3): 80 mg via INTRAVENOUS
  Filled 2021-03-19 (×3): qty 8

## 2021-03-19 MED ORDER — DOCUSATE SODIUM 100 MG PO CAPS
100.0000 mg | ORAL_CAPSULE | Freq: Two times a day (BID) | ORAL | Status: DC
  Administered 2021-03-19 – 2021-04-03 (×24): 100 mg via ORAL
  Filled 2021-03-19 (×29): qty 1

## 2021-03-19 NOTE — NC FL2 (Signed)
[place] LEVEL OF CARE SCREENING TOOL     IDENTIFICATION  Patient Name: Wayne Meadows Birthdate: 01/07/1935 Sex: male Admission Date (Current Location): 03/17/2021  [facility] and [place] Number:      Facility and Address:  [facility],  [place], [place]      Provider Number: [id number]  Attending Physician Name and Address:  Flora Lipps, MD  Relative Name and Phone Number:  Lyna Poser Daughter [phone]    Current Level of Care: Hospital Recommended Level of Care: [place] Prior Approval Number:    Date Approved/Denied:   PASRR Number: [id number]  Discharge Plan: SNF    Current Diagnoses: Patient Active Problem List   Diagnosis Date Noted   ARF (acute renal failure) ([place]) 03/18/2021   Acute renal failure (ARF) ([place]) 03/17/2021   Abnormal echocardiogram 03/08/2021   Proteinuria 03/07/2021   AKI (acute kidney injury) ([place]) 03/05/2021   Hypothyroidism 03/05/2021   Anxiety 03/05/2021   Rash 03/05/2021   Recurrent cellulitis of lower leg 03/04/2021   Chronic pancreatitis ([place]) 12/24/2020   Abnormal finding on GI tract imaging    Acute pancreatitis 08/12/2020   Nausea and vomiting in adult 06/22/2020   Gastric lesion    Esophageal stricture    Refractory nausea and vomiting 06/20/2020   Sepsis, unspecified organism ([place]) 04/11/2016   COPD exacerbation ([place]) 04/11/2016   CKD (chronic kidney disease), stage III ([place]) 04/11/2016   Hyperbilirubinemia 04/11/2016   Elevated lactic acid level    Renal insufficiency    Segmental colitis ([place]) 09/04/2015   Generalized abdominal pain 09/04/2015   Noninfectious gastroenteritis, unspecified    Benign neoplasm of transverse colon    Benign neoplasm of colon    Bright red blood per rectum    Acute blood loss anemia    Dysphagia    Renal cell cancer (HCC)    Essential hypertension    Centrilobular emphysema (HCC)    Blood in stool 08/19/2015    Rectal bleeding 08/19/2015   Pleuritic chest pain 04/08/2014   CAP (community acquired pneumonia) 04/08/2014   Community acquired pneumonia of left lower lobe of lung 04/08/2014   Overweight (BMI 25.0-29.9) 04/08/2014   Healthcare-associated pneumonia 10/26/2011   Cholelithiasis 04/26/2011   Atrial fibrillation ([place]) 04/26/2011   Chest pain 03/19/2011   Hypertension 03/19/2011   Unstable angina ([place]) 03/18/2011   Lung nodule 03/02/2011   Liver lesion 03/02/2011   Other hyperlipidemia 03/02/2011   Abnormal CT of the abdomen 02/23/2011   Non Q wave myocardial infarction ([place]) 02/22/2011   Cerebrovascular disease 02/22/2011   CAD (coronary artery disease) 02/22/2011   COPD (chronic obstructive pulmonary disease) ([place]) 02/22/2011   Tobacco abuse, 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/22/2011    Orientation RESPIRATION BLADDER Height & Weight     Self, Time, Situation, Place  Normal Continent Weight: 99.8 kg Height:  6\' 2"  (188 cm)  BEHAVIORAL SYMPTOMS/MOOD NEUROLOGICAL BOWEL NUTRITION STATUS      Continent Diet (Heart Healthy)  AMBULATORY STATUS COMMUNICATION OF NEEDS Skin   Extensive Assist Verbally Other (Comment) (Bilateral legs drainage)                       Personal Care Assistance Level of Assistance  Bathing, Feeding, Dressing Bathing Assistance: Maximum assistance Feeding assistance: Limited assistance Dressing Assistance: Maximum assistance     Functional Limitations Info  Sight, Hearing, Speech Sight Info: Adequate Hearing Info: Adequate Speech Info: Adequate    SPECIAL CARE FACTORS  FREQUENCY  PT (By licensed PT), OT (By licensed OT)     PT Frequency: x5 week OT Frequency: x5 week            Contractures Contractures Info: Not present    Additional Factors Info  Code Status, Allergies Code Status Info: FULL Allergies Info: Bee Venom, Influenza Vaccines           Current Medications (03/19/2021):  This is the current hospital active medication list Current  Facility-Administered Medications  Medication Dose Route Frequency Provider Last Rate Last Admin   acetaminophen (TYLENOL) tablet 650 mg  650 mg Oral Q6H PRN Rise Patience, MD       Or   acetaminophen (TYLENOL) suppository 650 mg  650 mg Rectal Q6H PRN Rise Patience, MD       albuterol (PROVENTIL) (2.5 MG/3ML) 0.083% nebulizer solution 2.5 mg  2.5 mg Inhalation Q6H PRN Pokhrel, Laxman, MD   2.5 mg at 03/18/21 2008   albuterol (PROVENTIL) (2.5 MG/3ML) 0.083% nebulizer solution 2.5 mg  2.5 mg Nebulization Q6H Pokhrel, Laxman, MD   2.5 mg at 03/19/21 0809   amLODipine (NORVASC) tablet 10 mg  10 mg Oral Daily Rise Patience, MD   10 mg at 03/19/21 9211   aspirin EC tablet 81 mg  81 mg Oral Daily Rise Patience, MD   81 mg at 03/19/21 9417   cefTRIAXone (ROCEPHIN) 2 g in sodium chloride 0.9 % 100 mL IVPB  2 g Intravenous Q24H Rise Patience, MD 200 mL/hr at 03/19/21 0237 2 g at 03/19/21 0237   cloNIDine (CATAPRES) tablet 0.1 mg  0.1 mg Oral Daily Rise Patience, MD   0.1 mg at 03/19/21 4081   clopidogrel (PLAVIX) tablet 75 mg  75 mg Oral Daily Rise Patience, MD   75 mg at 03/19/21 4481   docusate sodium (COLACE) capsule 100 mg  100 mg Oral BID Pokhrel, Laxman, MD       ferrous sulfate tablet 325 mg  325 mg Oral BID WC Rise Patience, MD   325 mg at 03/19/21 0918   heparin injection 5,000 Units  5,000 Units Subcutaneous Q8H Rise Patience, MD   5,000 Units at 03/19/21 0556   [START ON 03/20/2021] hydrocerin (EUCERIN) cream   Topical Once per day on Mon Thu Pokhrel, Laxman, MD       HYDROcodone-acetaminophen ([place]) 10-325 MG per tablet 1 tablet  1 tablet Oral BID PRN Flora Lipps, MD   1 tablet at 03/19/21 0917   levothyroxine (SYNTHROID) tablet 50 mcg  50 mcg Oral Q0600 Rise Patience, MD   50 mcg at 03/19/21 0557   metoprolol tartrate (LOPRESSOR) tablet 25 mg  25 mg Oral Daily Rise Patience, MD   25 mg at 03/19/21 8563    polyethylene glycol (MIRALAX / GLYCOLAX) packet 17 g  17 g Oral Daily Pokhrel, Laxman, MD       pravastatin (PRAVACHOL) tablet 40 mg  40 mg Oral QHS Rise Patience, MD   40 mg at 03/18/21 2026   simethicone (MYLICON) 40 JS/9.7WY suspension 40 mg  40 mg Oral QID PRN Pokhrel, Corrie Mckusick, MD         Discharge Medications: Please see discharge summary for a list of discharge medications.  Relevant Imaging Results:  Relevant Lab Results:   Additional Information OV#[id number]  Purcell Mouton, RN

## 2021-03-19 NOTE — TOC Progression Note (Signed)
Transition of Care [facility]) - Progression Note    Patient Details  Name: Wayne Meadows MRN: [id number] Date of Birth: [birth year]  Transition of Care [facility] CM/SW Contact  Purcell Mouton, RN Phone Number: 03/19/2021, 1:26 PM  Clinical Narrative:     Spoke with pt who agreed to going to SNF for Rehab. [facility].   Expected Discharge Plan: [place] Barriers to Discharge: No Barriers Identified  Expected Discharge Plan and Services Expected Discharge Plan: [place]   Discharge Planning Services: CM Consult Post Acute Care Choice: [place] Living arrangements for the past 2 months: Single Family Home                                       Social Determinants of Health (SDOH) Interventions    Readmission Risk Interventions Readmission Risk Prevention Plan 03/19/2021  SW Recovery Care/Counseling Consult Complete  Skilled Nursing Facility Complete  Some recent data might be hidden

## 2021-03-19 NOTE — ED Provider Notes (Signed)
[name] 4TH FLOOR PROGRESSIVE CARE AND UROLOGY Provider Note   CSN: [id number] Arrival date & time: 03/17/21  2017     History  Chief Complaint  Patient presents with   Weakness    Wayne Meadows is a 87 y.o. male.  Patient presents to the ER for generalized weakness, increased leg swelling and fluid discharge from bilateral legs.  Symptoms ongoing for the past 2 to 3 days.  He was recently discharged from the hospital this past week after admission for cellulitis, noted to have worsening kidney injury at the time but ultimately left AGAINST MEDICAL ADVICE and returns today.  Denies fevers or cough or vomiting or diarrhea.      Home Medications Prior to Admission medications   Medication Sig Start Date End Date Taking? Authorizing Provider  amLODipine (NORVASC) 10 MG tablet Take 1 tablet (10 mg total) by mouth daily. 07/11/20  Yes Margie Billet, NP  aspirin EC 81 MG tablet Take 81 mg by mouth daily.   Yes [provider]  baclofen (LIORESAL) 10 MG tablet Take 10 mg by mouth at bedtime.   Yes [provider]  cloNIDine (CATAPRES) 0.1 MG tablet Take 1 tablet (0.1 mg total) by mouth daily. 06/24/20  Yes Aslam, Loralyn Freshwater, MD  clopidogrel (PLAVIX) 75 MG tablet Take 75 mg by mouth daily.   Yes [provider]  ferrous sulfate 325 (65 FE) MG tablet Take 1 tablet by mouth. Every Monday, Wednesday, and Friday 07/30/20  Yes [provider]  furosemide (LASIX) 40 MG tablet Take 40 mg by mouth 2 (two) times daily. 03/14/21  Yes [provider]  gabapentin (NEURONTIN) 300 MG capsule Take 300-600 mg by mouth See admin instructions. Take 300 mg by mouth in the morning then 300 mg at noon then 600 mg at bedtime   Yes [provider]  HYDROcodone-acetaminophen (NORCO) 10-325 MG tablet Take 1 tablet by mouth 2 (two) times daily.   Yes [provider]  Ipratropium-Albuterol (COMBIVENT) 20-100 MCG/ACT AERS respimat Inhale 1 puff into the lungs 4  (four) times daily.   Yes [provider]  levothyroxine (SYNTHROID) 50 MCG tablet Take 50 mcg by mouth daily before breakfast.   Yes [provider]  metoprolol tartrate (LOPRESSOR) 25 MG tablet Take 25 mg by mouth daily.   Yes [provider]  Multiple Vitamin (MULTIVITAMIN) tablet Take 1 tablet by mouth daily.   Yes [provider]  polyethylene glycol (MIRALAX / GLYCOLAX) 17 g packet Take 17 g by mouth daily as needed for mild constipation or moderate constipation. 08/15/20  Yes [name], MD  pravastatin (PRAVACHOL) 40 MG tablet Take 40 mg by mouth at bedtime.   Yes [provider]  albuterol (VENTOLIN HFA) 108 (90 Base) MCG/ACT inhaler Inhale 1 puff into the lungs every 6 (six) hours as needed for shortness of breath.    [provider]  diclofenac Sodium (VOLTAREN) 1 % GEL Apply 2 g topically 4 (four) times daily. Patient not taking: Reported on 03/04/2021 06/23/20   Harvie Heck, MD  EPINEPHrine (EPI-PEN) 0.3 mg/0.3 mL DEVI Inject 0.3 mg into the muscle once as needed (for anaphylactic reaction).  Patient not taking: Reported on 03/18/2021    [provider]  LORazepam (ATIVAN) 0.5 MG tablet 1 PO 1 hour before MRI repeat after 30 minutes if needed Patient not taking: Reported on 03/04/2021 08/21/20   [name], MD  nitroGLYCERIN (NITROSTAT) 0.4 MG SL tablet Place 0.4 mg under  the tongue every 5 (five) minutes as needed for chest pain. Patient not taking: Reported on 03/18/2021    [provider]      Allergies    Bee venom and Influenza vaccines    Review of Systems   Review of Systems  Constitutional:  Negative for fever.  HENT:  Negative for ear pain and sore throat.   Eyes:  Negative for pain.  Respiratory:  Negative for cough.   Cardiovascular:  Negative for chest pain.  Gastrointestinal:  Negative for abdominal pain.  Genitourinary:  Negative for flank pain.  Musculoskeletal:  Negative for back  pain.  Skin:  Negative for color change and rash.  Neurological:  Negative for syncope.  All other systems reviewed and are negative.  Physical Exam Updated Vital Signs BP (!) 162/71 (BP Location: Left Arm)    Pulse 87    Temp 98.1 F (36.7 C) (Oral)    Resp 18    Ht 6\' 2"  (1.88 m)    Wt 99.8 kg    SpO2 92%    BMI 28.25 kg/m  Physical Exam Constitutional:      Appearance: He is well-developed.  HENT:     Head: Normocephalic.     Nose: Nose normal.  Eyes:     Extraocular Movements: Extraocular movements intact.  Cardiovascular:     Rate and Rhythm: Normal rate.  Pulmonary:     Effort: Pulmonary effort is normal.  Skin:    Coloration: Skin is not jaundiced.     Comments: Bilateral lower extremity induration mild erythema.  Bilateral lower extremity fluid weeping from his pretibial region.  Neurological:     Mental Status: He is alert. Mental status is at baseline.    ED Results / Procedures / Treatments   Labs (all labs ordered are listed, but only abnormal results are displayed) Labs Reviewed  CBC WITH DIFFERENTIAL/PLATELET - Abnormal; Notable for the following components:      Result Value   RBC 3.33 (*)    Hemoglobin 9.9 (*)    HCT 31.3 (*)    RDW 15.8 (*)    All other components within normal limits  BASIC METABOLIC PANEL - Abnormal; Notable for the following components:   Chloride 115 (*)    CO2 17 (*)    Glucose, Bld 111 (*)    BUN 57 (*)    Creatinine, Ser 4.87 (*)    Calcium 8.3 (*)    GFR, Estimated 11 (*)    All other components within normal limits  BRAIN NATRIURETIC PEPTIDE - Abnormal; Notable for the following components:   B Natriuretic Peptide 280.9 (*)    All other components within normal limits  HEPATIC FUNCTION PANEL - Abnormal; Notable for the following components:   Albumin 2.5 (*)    All other components within normal limits  CBC - Abnormal; Notable for the following components:   RBC 3.24 (*)    Hemoglobin 9.7 (*)    HCT 30.2 (*)    RDW  15.8 (*)    All other components within normal limits  BASIC METABOLIC PANEL - Abnormal; Notable for the following components:   Chloride 115 (*)    CO2 17 (*)    Glucose, Bld 103 (*)    BUN 59 (*)    Creatinine, Ser 4.93 (*)    Calcium 8.1 (*)    GFR, Estimated 11 (*)    All other components within normal limits  CK - Abnormal; Notable for the following  components:   Total CK 44 (*)    All other components within normal limits  CBC - Abnormal; Notable for the following components:   RBC 3.23 (*)    Hemoglobin 9.5 (*)    HCT 29.8 (*)    RDW 15.9 (*)    All other components within normal limits  COMPREHENSIVE METABOLIC PANEL - Abnormal; Notable for the following components:   Chloride 114 (*)    CO2 17 (*)    Glucose, Bld 118 (*)    BUN 66 (*)    Creatinine, Ser 4.80 (*)    Calcium 8.2 (*)    Total Protein 6.1 (*)    Albumin 2.3 (*)    AST 13 (*)    GFR, Estimated 11 (*)    All other components within normal limits  MAGNESIUM - Abnormal; Notable for the following components:   Magnesium 2.5 (*)    All other components within normal limits  PHOSPHORUS - Abnormal; Notable for the following components:   Phosphorus 5.3 (*)    All other components within normal limits  RESP PANEL BY RT-PCR (FLU A&B, COVID) [id number]  MAGNESIUM  URINALYSIS, ROUTINE W REFLEX MICROSCOPIC    EKG EKG Interpretation  Date/Time:  Monday March 17 2021 22:13:20 EST Ventricular Rate:  101 PR Interval:  175 QRS Duration: 98 QT Interval:  358 QTC Calculation: 464 R Axis:   106 Text Interpretation: Sinus tachycardia Anterior infarct, old Borderline repolarization abnormality Interpretation limited secondary to artifact Confirmed by Wynona Dove ([id number]) on 03/18/2021 9:57:25 AM  Radiology DG Abd 1 View  Result Date: 03/18/2021 CLINICAL DATA:  Increased weakness and distension EXAM: ABDOMEN - 1 VIEW COMPARISON:  10/26/2011 KUB, CT abdomen 08/12/2020 FINDINGS: Gaseous distension of the small bowel  and colon. No evidence of pneumoperitoneum, portal venous gas or pneumatosis. No pathologic calcifications along the expected course of the ureters. No acute osseous abnormality. Posterior spinal fusion from L2 through S1. Right hip arthroplasty. IMPRESSION: Gaseous distension of the small bowel and colon which may reflect an ileus versus partial bowel obstruction. Electronically Signed   By: Kathreen Devoid M.D.   On: 03/18/2021 06:35   US RENAL  Result Date: 03/18/2021 CLINICAL DATA:  Acute renal failure. EXAM: RENAL / URINARY TRACT ULTRASOUND COMPLETE COMPARISON:  Renal ultrasound dated 03/05/2021. FINDINGS: Right Kidney: Renal measurements: 11.4 x 5.9 x 6.3 cm = volume: 224 mL. Moderate parenchyma atrophy. There is diffuse increased renal echogenicity. No hydronephrosis or shadowing stone. Several cysts measure up to 4 cm in the upper pole. Left Kidney: Renal measurements: 9.6 x 5.8 x 5.7 cm = volume: 165 mL. Moderate parenchyma atrophy. Increased echogenicity. No hydronephrosis or shadowing stone. Several cysts measure up to 3.5 cm. Bladder: Appears normal for degree of bladder distention. Other: None. IMPRESSION: 1. Atrophic and echogenic kidneys in keeping with chronic kidney disease. No hydronephrosis or shadowing stone. 2. Bilateral renal cysts. Electronically Signed   By: Anner Crete M.D.   On: 03/18/2021 03:18    Procedures Procedures    Medications Ordered in ED Medications  aspirin EC tablet 81 mg (81 mg Oral Given 03/19/21 0918)  amLODipine (NORVASC) tablet 10 mg (10 mg Oral Given 03/19/21 0918)  cloNIDine (CATAPRES) tablet 0.1 mg (0.1 mg Oral Given 03/19/21 0918)  metoprolol tartrate (LOPRESSOR) tablet 25 mg (25 mg Oral Given 03/19/21 0918)  pravastatin (PRAVACHOL) tablet 40 mg (40 mg Oral Given 03/18/21 2026)  levothyroxine (SYNTHROID) tablet 50 mcg (50 mcg Oral Given 03/19/21 0557)  clopidogrel (  PLAVIX) tablet 75 mg (75 mg Oral Given 03/19/21 0918)  ferrous sulfate tablet 325 mg (325  mg Oral Given 03/19/21 0918)  heparin injection 5,000 Units (5,000 Units Subcutaneous Given 03/19/21 0556)  acetaminophen (TYLENOL) tablet 650 mg (has no administration in time range)    Or  acetaminophen (TYLENOL) suppository 650 mg (has no administration in time range)  cefTRIAXone (ROCEPHIN) 2 g in sodium chloride 0.9 % 100 mL IVPB (2 g Intravenous New Bag/Given 03/19/21 0237)  hydrocerin (EUCERIN) cream (has no administration in time range)  albuterol (PROVENTIL) (2.5 MG/3ML) 0.083% nebulizer solution 2.5 mg (2.5 mg Inhalation Given 03/18/21 2008)  albuterol (PROVENTIL) (2.5 MG/3ML) 0.083% nebulizer solution 2.5 mg (2.5 mg Nebulization Given 03/19/21 0809)  HYDROcodone-acetaminophen (NORCO) 10-325 MG per tablet 1 tablet (1 tablet Oral Given 03/19/21 0917)  HYDROcodone-acetaminophen (NORCO) 10-325 MG per tablet 0.5 tablet (0.5 tablets Oral Given 03/18/21 2108)    ED Course/ Medical Decision Making/ A&P                           Medical Decision Making Amount and/or Complexity of Data Reviewed Labs: ordered.  Risk Decision regarding hospitalization.   Labs show worsening kidney function.  Creatinine doubled to 4.  Patient appears to have significant peripheral edema in the lower extremities with fluid weeping through his dressings in his lower extremity.  Hospitalist consulted for readmission.        Final Clinical Impression(s) / ED Diagnoses Final diagnoses:  ARF (acute renal failure) (HCC)  Nausea & vomiting    Rx / DC Orders ED Discharge Orders     None         Luna Fuse, MD 03/19/21 [phone]

## 2021-03-19 NOTE — Evaluation (Signed)
Physical Therapy Evaluation Patient Details Name: Wayne Meadows MRN: [id number] DOB: 1934/11/17 Today's Date: 03/19/2021  History of Present Illness  87 y.o. male recently admitted for cellulitis of the lower extremity discharged on 1/17 on antibiotics. Pt presented to hospital with increasing weakness, difficulty ambulating with increasing redness and swelling of his lower extremities.  In the ED, patient was noted to have a weeping of his bilateral lower extremities with erythema.  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  PMH significant for HTN, CAD with stents, history of CVA, COPD (not on home O2), arthritis, tobacco abuse in remission presented in the ED with complaints of pain, swelling and redness of both lower extremities. Dx of recurrent cellulitis and ARF.   Clinical Impression  Wayne Meadows is 87 y.o. male admitted with above HPI and diagnosis. Patient is currently limited by functional impairments below (see PT problem list). Patient lives alone and PTA on 03/04/21 he was independent with rollator for mobility at baseline. He currently requires Mod assist for bed mobility and +2 assist to stand with RW from EOB. Patient will benefit from continued skilled PT interventions to address impairments and progress independence with mobility, recommending ST rehab at SNF to address impairments and improve independence with mobility prior to return home. Acute PT will follow and progress as able.     Recommendations for follow up therapy are one component of a multi-disciplinary discharge planning process, led by the attending physician.  Recommendations may be updated based on patient status, additional functional criteria and insurance authorization.  Follow Up Recommendations Skilled nursing-short term rehab (<3 hours/day)    Assistance Recommended at Discharge Frequent or  constant Supervision/Assistance  Patient can return home with the following       Equipment Recommendations None recommended by PT  Recommendations for Other Services       Functional Status Assessment Patient has had a recent decline in their functional status and demonstrates the ability to make significant improvements in function in a reasonable and predictable amount of time.     Precautions / Restrictions Precautions Precautions: Fall Restrictions Weight Bearing Restrictions: No      Mobility  Bed Mobility Overal bed mobility: Needs Assistance Bed Mobility: Supine to Sit, Sit to Supine     Supine to sit: Min assist, Mod assist, HOB elevated Sit to supine: Mod assist, Max assist, HOB elevated   General bed mobility comments: cues for use of bed rail and assist with  moving LE's off EOB. therapist using bed pad to pivot pt and pt then able to scoot forward with bil UE use. Mod-Max assist to bring LE's back onto bed. Pt able to use UE's and bend knees to help scoot self superior with bed in trendelenburg. EOS pt placed in chair position in bed.    Transfers Overall transfer level: Needs assistance Equipment used: Rolling walker (2 wheels) Transfers: Sit to/from Stand Sit to Stand: Min assist, +2 physical assistance, +2 safety/equipment, From elevated surface           General transfer comment: +2 Min assist to power up from EOB and for safety to steady. pt decelined need for [facility] and declined to sit in recliner. pt took small forward/backward/side steps at EOB wtih RW.    Ambulation/Gait             Pre-gait activities: pt took small forward/backward/side steps at EOB wtih  RW. pt took small forward/backward/side steps at EOB wtih RW.    Stairs            Wheelchair Mobility    Modified Rankin (Stroke Patients Only)       Balance Overall balance assessment: Needs assistance Sitting-balance support: Feet supported Sitting balance-Leahy Scale:  Fair     Standing balance support: Reliant on assistive device for balance, During functional activity, Bilateral upper extremity supported Standing balance-Leahy Scale: Poor                               Pertinent Vitals/Pain Pain Assessment Pain Assessment: Faces Faces Pain Scale: Hurts little more Pain Descriptors / Indicators: Aching, Discomfort Pain Intervention(s): Limited activity within patient's tolerance, Monitored during session, Repositioned    Home Living Family/patient expects to be discharged to:: Private residence Living Arrangements: Alone Available Help at Discharge: Family;Available PRN/intermittently Type of Home: House Home Access: Stairs to enter Entrance Stairs-Rails: None Entrance Stairs-Number of Steps: 2   Home Layout: One level Home Equipment: Rollator (4 wheels);Kasandra Knudsen - single point Additional Comments: family unable to provide assist needed for pt to get up and mobilize and is only available intermittently as they work    Prior Function Prior Level of Function : Independent/Modified Independent;Driving             Mobility Comments: at start of prior admission 1/10-1/17 pt was able to ambulate >100' with RW and supervision. pt reports worsening strength and tremor since that admission and has not ambulated since discharge home on the 17th. pt got weaker and reports his son was unable to help him stand.       Hand Dominance        Extremity/Trunk Assessment   Upper Extremity Assessment Upper Extremity Assessment: Generalized weakness;Defer to OT evaluation    Lower Extremity Assessment Lower Extremity Assessment: Generalized weakness    Cervical / Trunk Assessment Cervical / Trunk Assessment: Normal;Back Surgery  Communication   Communication: No difficulties  Cognition Arousal/Alertness: Awake/alert Behavior During Therapy: WFL for tasks assessed/performed Overall Cognitive Status: Within Functional Limits for tasks  assessed                                 General Comments: pt has a dry sense of humor, seems dissapointed in current mobility level, agreeable to participate with encouragement        General Comments      Exercises     Assessment/Plan    PT Assessment Patient needs continued PT services  PT Problem List Decreased activity tolerance;Cardiopulmonary status limiting activity       PT Treatment Interventions Gait training;Therapeutic exercise;Patient/family education    PT Goals (Current goals can be found in the Care Plan section)  Acute Rehab PT Goals Patient Stated Goal: get walking again PT Goal Formulation: With patient Time For Goal Achievement: 04/02/21 Potential to Achieve Goals: Good    Frequency Min 3X/week     Co-evaluation               AM-PAC PT "6 Clicks" Mobility  Outcome Measure Help needed turning from your back to your side while in a flat bed without using bedrails?: A Little Help needed moving from lying on your back to sitting on the side of a flat bed without using bedrails?: A Lot Help needed moving to and from a bed  to a chair (including a wheelchair)?: A Lot Help needed standing up from a chair using your arms (e.g., wheelchair or bedside chair)?: A Lot Help needed to walk in hospital room?: A Lot Help needed climbing 3-5 steps with a railing? : Total 6 Click Score: 12    End of Session Equipment Utilized During Treatment: Gait belt Activity Tolerance: Patient tolerated treatment well Patient left: in bed;with call bell/phone within reach Nurse Communication: Mobility status PT Visit Diagnosis: Difficulty in walking, not elsewhere classified (R26.2);Pain Pain - Right/Left: Right Pain - part of body: Ankle and joints of foot    Time: 1014-1101 PT Time Calculation (min) (ACUTE ONLY): 47 min   Charges:   PT Evaluation $PT Eval Moderate Complexity: 1 Mod PT Treatments $Therapeutic Activity: 23-37 mins         Verner Mould, DPT Acute Rehabilitation Services Office [phone] Pager [phone]   Jacques Navy 03/19/2021, 11:32 AM

## 2021-03-19 NOTE — Progress Notes (Signed)
PROGRESS NOTE  Wayne Meadows JKD:[id number] DOB: [birth year] DOA: 03/17/2021 PCP: [name]   LOS: 1 day   Brief narrative:   Wayne Meadows is a 87 y.o. male with history of chronic kidney disease stage III, CVA and CAD, diabetes mellitus, COPD, hypertension was recently admitted for cellulitis of the lower extremity discharged on antibiotic presented to hospital with increasing weakness, difficulty ambulating with increasing redness and swelling of his lower extremities.  In the ED, patient was noted to have a weeping of his bilateral lower extremities with erythema.  Creatinine had worsened from 2.9 to 4.8 on presentation.  Patient has mild distention.  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   Assessment/Plan:  Principal Problem:   Acute renal failure (ARF) (HCC) Active Problems:   CAD (coronary artery disease)   COPD (chronic obstructive pulmonary disease) (HCC)   Atrial fibrillation (HCC)   Renal cell cancer (HCC)   Essential hypertension   Centrilobular emphysema (HCC)   AKI (acute kidney injury) ([place])   Hypothyroidism   ARF (acute renal failure) (HCC)   Acute on chronic kidney disease stage IV with non-anion gap metabolic acidosis- Patient still makes urine.  Recent UA showing proteinuria.  Repeat UA pending at this time.  Patient with worsening lower extremity edema.  Communicated with nephrology for consultation.  Patient has worsening edema now and appears fluid overloaded.  Ultrasound of the kidneys showed atrophic and echogenic kidneys suggestive of chronic kidney disease.  Follow-up with nephrology.   Gross bilateral lower extremity edema anasarca/venous insufficiency.  Patient did have a recent ABI which was normal.  Dopplers were negative for DVT at that time.  Patient would benefit from compression of the lower extremities/Unna boots    CAD   History of coronary stent placement.  Continue Plavix statin and beta-blockers.     Essential hypertension  Continue amlodipine clonidine and beta-blockers.  As needed IV hydralazine.   Hypothyroidism Continue Synthroid   COPD  Compensated at this time.  Continue bronchodilators as needed   Chronic anemia likely from renal disease, follow CBC   Debility, deconditioning. Patient lives by himself at home and has a walker and cane at home.  DVT prophylaxis: heparin injection 5,000 Units Start: 03/18/21 0600   Code Status: Full code  Family Communication: I spoke with the patient's daughter on the phone and updated her about the clinical condition of the patient.    Status is: Inpatient  Remains inpatient appropriate because: Worsening CKD, fluid overload  Consultants: Nephrology  Procedures: None  Anti-infectives:  Rocephin IV  Anti-infectives (From admission, onward)    Start     Dose/Rate Route Frequency Ordered Stop   03/18/21 0200  cefTRIAXone (ROCEPHIN) 2 g in sodium chloride 0.9 % 100 mL IVPB        2 g 200 mL/hr over 30 Minutes Intravenous Every 24 hours 03/18/21 0158        Subjective:  Today, patient was seen and examined at bedside.  Complains of mild back pain.  Denies any chest pain, shortness of breath, fever or chills.  Has had bowel movement 2 days back.  Had eaten food yesterday.  Slept okay.  Objective: Vitals:   03/19/21 0454 03/19/21 0810  BP: (!) 162/71   Pulse: 87   Resp: 18   Temp: 98.1 F (36.7 C)   SpO2: 97% 92%    Intake/Output Summary (Last  24 hours) at 03/19/2021 0958 Last data filed at 03/18/2021 2209 Gross per 24 hour  Intake 480 ml  Output --  Net 480 ml   Filed Weights   03/17/21 2035 03/19/21 0500  Weight: 94.8 kg 99.8 kg   Body mass index is 28.25 kg/m.   Physical Exam:  GENERAL: Patient is alert awake and oriented. Not in obvious distress.  Slow to respond. HENT: Pallor noted.  Pupils equally reactive to  light. Oral mucosa is moist NECK: is supple, no gross swelling noted. CHEST: Clear to auscultation. No crackles or wheezes.  Diminished breath sounds bilaterally. CVS: S1 and S2 heard, no murmur. Regular rate and rhythm.  Distended and tympanic. ABDOMEN: Soft, non-tender, bowel sounds are present.  External Foley catheter in place EXTREMITIES: Bilateral lower extremity with Unna boot. CNS: Cranial nerves are intact.  Moves all extremities. SKIN: warm and dry without rashes.  Bilateral lower extremity with Unna boot.  Data Review: I have personally reviewed the following laboratory data and studies,  CBC: Recent Labs  Lab 03/17/21 2155 03/18/21 0418 03/19/21 0357  WBC 9.8 9.4 8.3  NEUTROABS 7.1  --   --   HGB 9.9* 9.7* 9.5*  HCT 31.3* 30.2* 29.8*  MCV 94.0 93.2 92.3  PLT 295 258 154   Basic Metabolic Panel: Recent Labs  Lab 03/17/21 2155 03/18/21 0418 03/19/21 0357  NA 139 140 140  K 4.5 4.3 4.4  CL 115* 115* 114*  CO2 17* 17* 17*  GLUCOSE 111* 103* 118*  BUN 57* 59* 66*  CREATININE 4.87* 4.93* 4.80*  CALCIUM 8.3* 8.1* 8.2*  MG 2.3  --  2.5*  PHOS  --   --  5.3*   Liver Function Tests: Recent Labs  Lab 03/17/21 2155 03/19/21 0357  AST 15 13*  ALT 10 8  ALKPHOS 75 61  BILITOT 0.6 0.5  PROT 6.6 6.1*  ALBUMIN 2.5* 2.3*   No results for input(s): LIPASE, AMYLASE in the last 168 hours. No results for input(s): AMMONIA in the last 168 hours. Cardiac Enzymes: Recent Labs  Lab 03/18/21 0418  CKTOTAL 44*   BNP (last 3 results) Recent Labs    03/07/21 1714 03/08/21 0509 03/17/21 2155  BNP 931.2* 915.5* 280.9*    ProBNP (last 3 results) No results for input(s): PROBNP in the last 8760 hours.  CBG: No results for input(s): GLUCAP in the last 168 hours. Recent Results (from the past 240 hour(s))  Resp Panel by RT-PCR (Flu A&B, Covid) Nasopharyngeal Swab     Status: None   Collection Time: 03/17/21 10:08 PM   Specimen: Nasopharyngeal Swab;  Nasopharyngeal(NP) swabs in vial transport medium  Result Value Ref Range Status   SARS Coronavirus 2 by RT PCR NEGATIVE NEGATIVE Final    Comment: (NOTE) SARS-CoV-2 target nucleic acids are NOT DETECTED.  The SARS-CoV-2 RNA is generally detectable in upper respiratory specimens during the acute phase of infection. The lowest concentration of SARS-CoV-2 viral copies this assay can detect is 138 copies/mL. A negative result does not preclude SARS-Cov-2 infection and should not be used as the sole basis for treatment or other patient management decisions. A negative result may occur with  improper specimen collection/handling, submission of specimen other than nasopharyngeal swab, presence of viral mutation(s) within the areas targeted by this assay, and inadequate number of viral copies(<138 copies/mL). A negative result must be combined with clinical observations, patient history, and epidemiological information. The expected result is Negative.  Fact Sheet for Patients:  EntrepreneurPulse.com.au  Fact Sheet for Healthcare Providers:  IncredibleEmployment.be  This test is no t yet approved or cleared by the [place] FDA and  has been authorized for detection and/or diagnosis of SARS-CoV-2 by FDA under an Emergency Use Authorization (EUA). This EUA will remain  in effect (meaning this test can be used) for the duration of the COVID-19 declaration under Section 564(b)(1) of the Act, 21 U.S.C.section 360bbb-3(b)(1), unless the authorization is terminated  or revoked sooner.       Influenza A by PCR NEGATIVE NEGATIVE Final   Influenza B by PCR NEGATIVE NEGATIVE Final    Comment: (NOTE) The Xpert Xpress SARS-CoV-2/FLU/RSV plus assay is intended as an aid in the diagnosis of influenza from Nasopharyngeal swab specimens and should not be used as a sole basis for treatment. Nasal washings and aspirates are unacceptable for Xpert Xpress  SARS-CoV-2/FLU/RSV testing.  Fact Sheet for Patients: EntrepreneurPulse.com.au  Fact Sheet for Healthcare Providers: IncredibleEmployment.be  This test is not yet approved or cleared by the [place] FDA and has been authorized for detection and/or diagnosis of SARS-CoV-2 by FDA under an Emergency Use Authorization (EUA). This EUA will remain in effect (meaning this test can be used) for the duration of the COVID-19 declaration under Section 564(b)(1) of the Act, 21 U.S.C. section 360bbb-3(b)(1), unless the authorization is terminated or revoked.  Performed at [facility], [place] [address] [place], [place] [address]      Studies: DG Abd 1 View  Result Date: 03/18/2021 CLINICAL DATA:  Increased weakness and distension EXAM: ABDOMEN - 1 VIEW COMPARISON:  10/26/2011 KUB, CT abdomen 08/12/2020 FINDINGS: Gaseous distension of the small bowel and colon. No evidence of pneumoperitoneum, portal venous gas or pneumatosis. No pathologic calcifications along the expected course of the ureters. No acute osseous abnormality. Posterior spinal fusion from L2 through S1. Right hip arthroplasty. IMPRESSION: Gaseous distension of the small bowel and colon which may reflect an ileus versus partial bowel obstruction. Electronically Signed   By: Kathreen Devoid M.D.   On: 03/18/2021 06:35   US RENAL  Result Date: 03/18/2021 CLINICAL DATA:  Acute renal failure. EXAM: RENAL / URINARY TRACT ULTRASOUND COMPLETE COMPARISON:  Renal ultrasound dated 03/05/2021. FINDINGS: Right Kidney: Renal measurements: 11.4 x 5.9 x 6.3 cm = volume: 224 mL. Moderate parenchyma atrophy. There is diffuse increased renal echogenicity. No hydronephrosis or shadowing stone. Several cysts measure up to 4 cm in the upper pole. Left Kidney: Renal measurements: 9.6 x 5.8 x 5.7 cm = volume: 165 mL. Moderate parenchyma atrophy. Increased echogenicity. No hydronephrosis or shadowing  stone. Several cysts measure up to 3.5 cm. Bladder: Appears normal for degree of bladder distention. Other: None. IMPRESSION: 1. Atrophic and echogenic kidneys in keeping with chronic kidney disease. No hydronephrosis or shadowing stone. 2. Bilateral renal cysts. Electronically Signed   By: Anner Crete M.D.   On: 03/18/2021 03:18      Flora Lipps, MD  [facility] 03/19/2021  If 7PM-7AM, please contact night-coverage

## 2021-03-19 NOTE — Consult Note (Signed)
Renal Service Consult Note [facility]  Wayne Meadows 03/19/2021 Wayne Blazing, MD Requesting Physician: Dr. Louanne Meadows  Reason for Consult: Renal failure HPI: The patient is a 87 y.o. year-old Wayne/ hx of COPD, CAD hx stenting, sp CVA, HTN, CKD 3b who presented from home after recent admit for LE cellulitis.  Pt c/o unable to walk, gen'd weakness in LE's, abd distension. In ED pt had worsening LE edema and erythema Wayne/ weeping wounds. Creat had worsened to 4.8 from b/l creat 1.9- 2.5.  Pt was admitted. Unna boots were added to the lower legs bilat.  Rocephin was started for cellulitis. Asked to see for renal faliure and volume overload.   Pt seen in room. Hx as above. No nsaids. No acei or ARB.    ROS - denies CP, no joint pain, no HA, no blurry vision, no rash, no diarrhea, no nausea/ vomiting, no dysuria, no difficulty voiding   Past Medical History  Past Medical History:  Diagnosis Date   Arthritis    Carotid stenosis    COPD (chronic obstructive pulmonary disease) ([place])    Coronary artery disease    S/p PCI 2011;  NSTEMI 12/12:  LHC/PCI 02/23/11: LAD 60% after the septal perforator, D1 occluded with distal collaterals, proximal RI 30-40%, AV circumflex stent patent with 60% stenosis after the stent, RCA 99%, EF 60-65%.  His RCA was treated with a bare-metal stent   CVA (cerebral infarction) 2011   Right cerebral; total obstruction of the right ICA   Diverticulitis    Hypertension    Rectal bleeding 07/2015   Renal carcinoma (HCC)    Stroke (HCC)    Tobacco abuse, in remission    Past Surgical History  Past Surgical History:  Procedure Laterality Date   COLON SURGERY     COLONOSCOPY N/A 08/22/2015   Procedure: COLONOSCOPY;  Surgeon: Wayne Pole, MD;  Location: [facility] ENDOSCOPY;  Service: Endoscopy;  Laterality: N/A;   ESOPHAGOGASTRODUODENOSCOPY N/A 07/18/2020   Procedure: ESOPHAGOGASTRODUODENOSCOPY (EGD);  Surgeon: Wayne Banister, MD;  Location: [facility]  ENDOS[facility];  Service: Endoscopy;  Laterality: N/A;   ESOPHAGOGASTRODUODENOSCOPY (EGD) WITH PROPOFOL N/A 06/21/2020   Procedure: ESOPHAGOGASTRODUODENOSCOPY (EGD) WITH PROPOFOL;  Surgeon: Wayne Shipper, MD;  Location[facility] ENDOSCOPY;  Service: Endoscopy;  Laterality: N/A;   EUS N/A 07/18/2020   Procedure: UPPER ENDOSCOPIC ULTRASOUND (EUS) RADIAL;  Surgeon: Wayne Banister, MD;  Location: [facility] ENDOSCOPY;  Service: Endoscopy;  Laterality: N/A;   FINE NEEDLE ASPIRATION N/A 07/18/2020   Procedure: FINE NEEDLE ASPIRATION (FNA) LINEAR;  Surgeon: Wayne Banister, MD;  Location: [facility] ENDOSCOPY;  Service: Endoscopy;  Laterality: N/A;   hip relacement     KIDNEY SURGERY     LEFT HEART CATH AND CORONARY ANGIOGRAPHY N/A 07/09/2020   Procedure: LEFT HEART CATH AND CORONARY ANGIOGRAPHY;  Surgeon: Wayne Man, MD;  Location: [facility] CV LAB;  Service: Cardiovascular;  Laterality: N/A;   LEFT HEART CATHETERIZATION WITH CORONARY ANGIOGRAM N/A 02/23/2011   Procedure: LEFT HEART CATHETERIZATION WITH CORONARY ANGIOGRAM;  Surgeon: Wayne Hector, MD;  Location: [facility] CATH LAB;  Service: Cardiovascular;  Laterality: N/A;   LEFT HEART CATHETERIZATION WITH CORONARY ANGIOGRAM N/A 03/18/2011   Procedure: LEFT HEART CATHETERIZATION WITH CORONARY ANGIOGRAM;  Surgeon: Wayne Dresser, MD;  Location: [facility] CATH LAB;  Service: Cardiovascular;  Laterality: N/A;   PERCUTANEOUS CORONARY STENT INTERVENTION (PCI-S) N/A 02/23/2011   Procedure: PERCUTANEOUS CORONARY STENT INTERVENTION (PCI-S);  Surgeon: Wayne Hector, MD;  Location: [facility]  CATH LAB;  Service: Cardiovascular;  Laterality: N/A;   TEMPORARY PACEMAKER INSERTION N/A 02/23/2011   Procedure: TEMPORARY PACEMAKER INSERTION;  Surgeon: Wayne Hector, MD;  Location: [facility] CATH LAB;  Service: Cardiovascular;  Laterality: N/A;   Family History  Family History  Problem Relation Age of Onset   Heart attack Other 19   Social History  reports that he quit smoking about 14 years ago. His smoking  use included cigarettes. He has never used smokeless tobacco. He reports that he does not currently use alcohol after a past usage of about 1.0 standard drink per week. He reports that he does not use drugs. Allergies  Allergies  Allergen Reactions   Bee Venom Anaphylaxis    Has epi pen   Influenza Vaccines Other (See Comments)    "Mortally sick for 2 weeks"   Home medications Prior to Admission medications   Medication Sig Start Date End Date Taking? Authorizing Provider  amLODipine (NORVASC) 10 MG tablet Take 1 tablet (10 mg total) by mouth daily. 07/11/20  Yes Wayne Billet, NP  aspirin EC 81 MG tablet Take 81 mg by mouth daily.   Yes [provider]  baclofen (LIORESAL) 10 MG tablet Take 10 mg by mouth at bedtime.   Yes [provider]  cloNIDine (CATAPRES) 0.1 MG tablet Take 1 tablet (0.1 mg total) by mouth daily. 06/24/20  Yes Wayne Meadows, Wayne Freshwater, MD  clopidogrel (PLAVIX) 75 MG tablet Take 75 mg by mouth daily.   Yes [provider]  ferrous sulfate 325 (65 FE) MG tablet Take 1 tablet by mouth. Every Monday, Wednesday, and Friday 07/30/20  Yes [provider]  furosemide (LASIX) 40 MG tablet Take 40 mg by mouth 2 (two) times daily. 03/14/21  Yes [provider]  gabapentin (NEURONTIN) 300 MG capsule Take 300-600 mg by mouth See admin instructions. Take 300 mg by mouth in the morning then 300 mg at noon then 600 mg at bedtime   Yes [provider]  HYDROcodone-acetaminophen (NORCO) 10-325 MG tablet Take 1 tablet by mouth 2 (two) times daily.   Yes [provider]  Ipratropium-Albuterol (COMBIVENT) 20-100 MCG/ACT AERS respimat Inhale 1 puff into the lungs 4 (four) times daily.   Yes [provider]  levothyroxine (SYNTHROID) 50 MCG tablet Take 50 mcg by mouth daily before breakfast.   Yes [provider]  metoprolol tartrate (LOPRESSOR) 25 MG tablet Take 25 mg by mouth daily.   Yes [provider]  Multiple Vitamin  (MULTIVITAMIN) tablet Take 1 tablet by mouth daily.   Yes [provider]  polyethylene glycol (MIRALAX / GLYCOLAX) 17 g packet Take 17 g by mouth daily as needed for mild constipation or moderate constipation. 08/15/20  Yes [name], MD  pravastatin (PRAVACHOL) 40 MG tablet Take 40 mg by mouth at bedtime.   Yes [provider]  albuterol (VENTOLIN HFA) 108 (90 Base) MCG/ACT inhaler Inhale 1 puff into the lungs every 6 (six) hours as needed for shortness of breath.    [provider]  diclofenac Sodium (VOLTAREN) 1 % GEL Apply 2 g topically 4 (four) times daily. Patient not taking: Reported on 03/04/2021 06/23/20   Harvie Heck, MD  EPINEPHrine (EPI-PEN) 0.3 mg/0.3 mL DEVI Inject 0.3 mg into the muscle once as needed (for anaphylactic reaction).  Patient not taking: Reported on 03/18/2021    [provider]  LORazepam (ATIVAN) 0.5 MG tablet 1 PO 1 hour before MRI repeat after 30 minutes if needed Patient  not taking: Reported on 03/04/2021 08/21/20   Wayne Pole, MD  nitroGLYCERIN (NITROSTAT) 0.4 MG SL tablet Place 0.4 mg under the tongue every 5 (five) minutes as needed for chest pain. Patient not taking: Reported on 03/18/2021    [provider]     Vitals:   03/19/21 0454 03/19/21 0500 03/19/21 0810 03/19/21 1323  BP: (!) 162/71   140/66  Pulse: 87   79  Resp: 18   18  Temp: 98.1 F (36.7 C)   98.3 F (36.8 C)  TempSrc: Oral   Oral  SpO2: 97%  92% 93%  Weight:  99.8 kg    Height:       Exam Gen alert, no distress No rash, cyanosis or gangrene Sclera anicteric, throat clear  No jvd or bruits Chest occ rales at bases, no wheezing, nasal O2 Bilat lower legs are wrapped, hips 2-3+ edema, UE's 2+ bilat edema RRR no MRG Abd soft ntnd no mass or ascites +bs GU normal male MS no joint effusions Neuro is alert, Ox 3 , nf     Home meds include - norvasc, asa, catapres, plavix, lasix 40 bid, norco prn, combivent, synthroid,  neurontin, lopressor 25 qd, pravachol, sl ntg, prns/ vits / supps       Date    Creat  eGFR    Jan -June 2022 1.81- 2.02 Jan 2021  1.98  33 ml/min, stage IIIb     Jan 10- 16, 2023 1.86- 2.93     Jan 23  4.87   Jan 24  4.93    Mar 19, 2020 4.80        Alb 2.3  Co2 17  BUN 66  creat 4.80       BNP 281      Last CXR 1/15 = COPD      Renal US 1/24 - 11.4 and 9.6 cm kidneys Wayne/o hydronephrosis. Mod atrophy and ^'d echotexture bilaterally.      ECHO 03/08/20 - LVEF 65- 70%, G1DD     Assessment/ Plan: AKI on CKD 3b - b/l creat from Nov 2022 1.98, eGFR 33 ml/min.  Here Wayne/ AKI creat 4.8 in setting of weeping edema of LE's and UE's, volume overload, LE cellulitis possibly. Renal US shows chronic changes, no obstruction. UA pending. AKI likely due to decompensated CHF/ vol overload.  Recommend IV Lasix 80 mg tid, titrate as needed. No uremic sx/ sx at this time, no need for RRT at this time. Will follow.  HTN - cont BP meds but keep SBP > 120 in setting of acute diuresis COPD H/o CVA H/o CAD sp stent Anemia - Hb 9's, follow       [name]  MD 03/19/2021, 4:19 PM  Recent Labs  Lab 03/18/21 0418 03/19/21 0357  WBC 9.4 8.3  HGB 9.7* 9.5*   Recent Labs  Lab 03/18/21 0418 03/19/21 0357  K 4.3 4.4  BUN 59* 66*  CREATININE 4.93* 4.80*  CALCIUM 8.1* 8.2*  PHOS  --  5.3*

## 2021-03-20 DIAGNOSIS — N179 Acute kidney failure, unspecified: Secondary | ICD-10-CM | POA: Diagnosis not present

## 2021-03-20 DIAGNOSIS — I4891 Unspecified atrial fibrillation: Secondary | ICD-10-CM | POA: Diagnosis not present

## 2021-03-20 DIAGNOSIS — J449 Chronic obstructive pulmonary disease, unspecified: Secondary | ICD-10-CM | POA: Diagnosis not present

## 2021-03-20 DIAGNOSIS — J432 Centrilobular emphysema: Secondary | ICD-10-CM | POA: Diagnosis not present

## 2021-03-20 LAB — CBC
HCT: 29.6 % — ABNORMAL LOW (ref 39.0–52.0)
Hemoglobin: 9.3 g/dL — ABNORMAL LOW (ref 13.0–17.0)
MCH: 29.4 pg (ref 26.0–34.0)
MCHC: 31.4 g/dL (ref 30.0–36.0)
MCV: 93.7 fL (ref 80.0–100.0)
Platelets: 245 10*3/uL (ref 150–400)
RBC: 3.16 MIL/uL — ABNORMAL LOW (ref 4.22–5.81)
RDW: 15.8 % — ABNORMAL HIGH (ref 11.5–15.5)
WBC: 8.3 10*3/uL (ref 4.0–10.5)
nRBC: 0 % (ref 0.0–0.2)

## 2021-03-20 LAB — COMPREHENSIVE METABOLIC PANEL
ALT: 8 U/L (ref 0–44)
AST: 14 U/L — ABNORMAL LOW (ref 15–41)
Albumin: 2.2 g/dL — ABNORMAL LOW (ref 3.5–5.0)
Alkaline Phosphatase: 64 U/L (ref 38–126)
Anion gap: 10 (ref 5–15)
BUN: 65 mg/dL — ABNORMAL HIGH (ref 8–23)
CO2: 16 mmol/L — ABNORMAL LOW (ref 22–32)
Calcium: 8.3 mg/dL — ABNORMAL LOW (ref 8.9–10.3)
Chloride: 114 mmol/L — ABNORMAL HIGH (ref 98–111)
Creatinine, Ser: 4.9 mg/dL — ABNORMAL HIGH (ref 0.61–1.24)
GFR, Estimated: 11 mL/min — ABNORMAL LOW (ref >60)
Glucose, Bld: 84 mg/dL (ref 70–99)
Potassium: 4.9 mmol/L (ref 3.5–5.1)
Sodium: 140 mmol/L (ref 135–145)
Total Bilirubin: 0.5 mg/dL (ref 0.3–1.2)
Total Protein: 6 g/dL — ABNORMAL LOW (ref 6.5–8.1)

## 2021-03-20 LAB — MAGNESIUM: Magnesium: 2.1 mg/dL (ref 1.7–2.4)

## 2021-03-20 LAB — PHOSPHORUS: Phosphorus: 5.8 mg/dL — ABNORMAL HIGH (ref 2.5–4.6)

## 2021-03-20 MED ORDER — ONDANSETRON HCL 4 MG/2ML IJ SOLN
4.0000 mg | Freq: Four times a day (QID) | INTRAMUSCULAR | Status: DC | PRN
  Administered 2021-03-20 – 2021-04-03 (×13): 4 mg via INTRAVENOUS
  Filled 2021-03-20 (×13): qty 2

## 2021-03-20 MED ORDER — LIDOCAINE 5 % EX PTCH
1.0000 | MEDICATED_PATCH | CUTANEOUS | Status: DC
  Administered 2021-03-20 – 2021-04-04 (×14): 1 via TRANSDERMAL
  Filled 2021-03-20 (×15): qty 1

## 2021-03-20 MED ORDER — FUROSEMIDE 10 MG/ML IJ SOLN
120.0000 mg | Freq: Three times a day (TID) | INTRAVENOUS | Status: DC
  Filled 2021-03-20: qty 12

## 2021-03-20 MED ORDER — CHLORHEXIDINE GLUCONATE CLOTH 2 % EX PADS
6.0000 | MEDICATED_PAD | Freq: Every day | CUTANEOUS | Status: DC
  Administered 2021-03-20 – 2021-03-29 (×10): 6 via TOPICAL

## 2021-03-20 MED ORDER — FUROSEMIDE 10 MG/ML IJ SOLN
80.0000 mg | Freq: Three times a day (TID) | INTRAMUSCULAR | Status: DC
  Administered 2021-03-20 – 2021-03-21 (×3): 80 mg via INTRAVENOUS
  Filled 2021-03-20 (×2): qty 8

## 2021-03-20 NOTE — Progress Notes (Signed)
Orthopedic Tech Progress Note Patient Details:  [name] 1934/06/13 [id number]  Patient ID: [name], male   DOB: December 06, 1934, 87 y.o.   MRN: [id number]  [name] 03/20/2021, 10:06 AM Bi lat unna boot applied

## 2021-03-20 NOTE — Progress Notes (Addendum)
PROGRESS NOTE  Wayne Meadows UUE:[id number] DOB: June 14, 1934 DOA: 03/17/2021 PCP: [name]   LOS: 2 days   Brief narrative:   Wayne Meadows is a 87 y.o. male with history of chronic kidney disease stage IIIb, CVA and CAD, diabetes mellitus, COPD, hypertension was recently admitted for cellulitis of the lower extremity, discharged on antibiotic presented to hospital with increasing weakness, difficulty ambulating with increasing redness and swelling of his lower extremities.  In the ED, patient was noted to have a weeping of his bilateral lower extremities with erythema.  Creatinine had worsened from 2.9 to 4.8 on presentation.   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   Assessment/Plan:  Principal Problem:   Acute renal failure (ARF) (HCC) Active Problems:   CAD (coronary artery disease)   COPD (chronic obstructive pulmonary disease) (HCC)   Atrial fibrillation (HCC)   Renal cell cancer (HCC)   Essential hypertension   Centrilobular emphysema (HCC)   AKI (acute kidney injury) ([place])   Hypothyroidism   ARF (acute renal failure) (HCC)   Acute on chronic kidney disease stage IV with non-anion gap metabolic acidosis- Urinalysis with proteinuria..  Patient with lower extremity edema.  Nephrology on board and has been started on IV Lasix every 8 hourly.  Patient did have urinary retention and required in and out catheterization.  Will consider Foley catheter for strict intake and output charting if continues to retain.  Ultrasound of the kidneys showed atrophic and echogenic kidneys suggestive of chronic kidney disease.  Autoimmune work-up has been sent as well.   Gross bilateral lower extremity edema anasarca/venous insufficiency.  Patient did have a recent ABI which was normal.  Dopplers were negative for DVT at that time.  Patient would benefit from compression of  the lower extremities/Unna boots    CAD  History of coronary stent placement.  Continue Plavix statin and beta-blockers.   Right chest wall pain.  Likely musculoskeletal, patient has tenderness on palpation and especially painful on movement of the chest wall.  Will add Lidoderm patch, warm compress.  Monitor   Essential hypertension  Continue amlodipine clonidine and beta-blockers.  As needed IV hydralazine.   Hypothyroidism Continue Synthroid   COPD  Compensated at this time.  Continue bronchodilators as needed   Chronic anemia likely from renal disease, follow CBC   Debility, deconditioning. Patient lives by himself at home and has a walker and cane at home.  Therapy has recommended skilled nursing facility  DVT prophylaxis: heparin injection 5,000 Units Start: 03/18/21 0600   Code Status: Full code  Family Communication:  I spoke with the patient's daughter on the phone on 03/19/2021  Status is: Inpatient  Remains inpatient appropriate because: Worsening CKD, fluid overload IV diuretics  Consultants: Nephrology  Procedures: None  Anti-infectives:  Rocephin IV  Anti-infectives (From admission, onward)    Start     Dose/Rate Route Frequency Ordered Stop   03/18/21 0200  cefTRIAXone (ROCEPHIN) 2 g in sodium chloride 0.9 % 100 mL IVPB        2 g 200 mL/hr over 30 Minutes Intravenous Every 24 hours 03/18/21 0158        Subjective:  Today, patient was seen and examined at bedside.  Complains of mild chest wall pain especially on moving his chest wall.  Has mild shortness of breath.  No nausea vomiting.  Has not had a bowel movement.  Patient did have urinary retention yesterday and required In-N-Out catheterization.    Vitals:   03/19/21 2035 03/20/21 0341  BP: (!) 142/74 (!) 166/72  Pulse: 90 88  Resp: 19 20  Temp: 98.7 F (37.1 C) 99.1 F (37.3 C)  SpO2: 94% 90%    Intake/Output Summary (Last 24 hours) at 03/20/2021 0946 Last data filed at 03/20/2021  0600 Gross per 24 hour  Intake 820 ml  Output 1450 ml  Net -630 ml    Filed Weights   03/17/21 2035 03/19/21 0500 03/20/21 0454  Weight: 94.8 kg 99.8 kg 98.4 kg   Body mass index is 27.85 kg/m.   Physical Exam:  GENERAL: Patient is alert awake and oriented. Not in obvious distress.  Slow to respond.  On nasal cannula oxygen. HENT: Mild pallor noted.  Pupils equally reactive to light. Oral mucosa is moist NECK: is supple, no gross swelling noted. CHEST: Clear to auscultation. No crackles or wheezes.  Diminished breath sounds bilaterally. CVS: S1 and S2 heard, no murmur. Regular rate and rhythm.  Distended and tympanic. ABDOMEN: Soft, non-tender, bowel sounds are present.  External Foley catheter in place EXTREMITIES: Bilateral lower extremity with erythema edema. CNS: Cranial nerves are intact.  Moves all extremities. SKIN: warm and dry without rashes  Data Review: I have personally reviewed the following laboratory data and studies,  CBC: Recent Labs  Lab 03/17/21 2155 03/18/21 0418 03/19/21 0357 03/20/21 0340  WBC 9.8 9.4 8.3 8.3  NEUTROABS 7.1  --   --   --   HGB 9.9* 9.7* 9.5* 9.3*  HCT 31.3* 30.2* 29.8* 29.6*  MCV 94.0 93.2 92.3 93.7  PLT 295 258 252 443    Basic Metabolic Panel: Recent Labs  Lab 03/17/21 2155 03/18/21 0418 03/19/21 0357 03/20/21 0340  NA 139 140 140 140  K 4.5 4.3 4.4 4.9  CL 115* 115* 114* 114*  CO2 17* 17* 17* 16*  GLUCOSE 111* 103* 118* 84  BUN 57* 59* 66* 65*  CREATININE 4.87* 4.93* 4.80* 4.90*  CALCIUM 8.3* 8.1* 8.2* 8.3*  MG 2.3  --  2.5* 2.1  PHOS  --   --  5.3* 5.8*    Liver Function Tests: Recent Labs  Lab 03/17/21 2155 03/19/21 0357 03/20/21 0340  AST 15 13* 14*  ALT 10 8 8   ALKPHOS 75 61 64  BILITOT 0.6 0.5 0.5  PROT 6.6 6.1* 6.0*  ALBUMIN 2.5* 2.3* 2.2*    No results for input(s): LIPASE, AMYLASE in the last 168 hours. No results for input(s): AMMONIA in the last 168 hours. Cardiac Enzymes: Recent Labs   Lab 03/18/21 0418  CKTOTAL 44*    BNP (last 3 results) Recent Labs    03/07/21 1714 03/08/21 0509 03/17/21 2155  BNP 931.2* 915.5* 280.9*     ProBNP (last 3 results) No results for input(s): PROBNP in the last 8760 hours.  CBG: No results for input(s): GLUCAP in the last 168 hours. Recent Results (from the past 240 hour(s))  Resp Panel by RT-PCR (Flu A&B, Covid) Nasopharyngeal Swab     Status: None   Collection Time: 03/17/21 10:08 PM   Specimen: Nasopharyngeal Swab; Nasopharyngeal(NP) swabs in vial transport medium  Result Value Ref Range Status   SARS Coronavirus 2 by RT PCR NEGATIVE NEGATIVE Final    Comment: (NOTE) SARS-CoV-2 target nucleic acids are NOT DETECTED.  The SARS-CoV-2 RNA is generally detectable in upper respiratory specimens during the acute phase of infection. The lowest concentration of SARS-CoV-2  viral copies this assay can detect is 138 copies/mL. A negative result does not preclude SARS-Cov-2 infection and should not be used as the sole basis for treatment or other patient management decisions. A negative result may occur with  improper specimen collection/handling, submission of specimen other than nasopharyngeal swab, presence of viral mutation(s) within the areas targeted by this assay, and inadequate number of viral copies(<138 copies/mL). A negative result must be combined with clinical observations, patient history, and epidemiological information. The expected result is Negative.  Fact Sheet for Patients:  EntrepreneurPulse.com.au  Fact Sheet for Healthcare Providers:  IncredibleEmployment.be  This test is no t yet approved or cleared by the [place] FDA and  has been authorized for detection and/or diagnosis of SARS-CoV-2 by FDA under an Emergency Use Authorization (EUA). This EUA will remain  in effect (meaning this test can be used) for the duration of the COVID-19 declaration under Section  564(b)(1) of the Act, 21 U.S.C.section 360bbb-3(b)(1), unless the authorization is terminated  or revoked sooner.       Influenza A by PCR NEGATIVE NEGATIVE Final   Influenza B by PCR NEGATIVE NEGATIVE Final    Comment: (NOTE) The Xpert Xpress SARS-CoV-2/FLU/RSV plus assay is intended as an aid in the diagnosis of influenza from Nasopharyngeal swab specimens and should not be used as a sole basis for treatment. Nasal washings and aspirates are unacceptable for Xpert Xpress SARS-CoV-2/FLU/RSV testing.  Fact Sheet for Patients: EntrepreneurPulse.com.au  Fact Sheet for Healthcare Providers: IncredibleEmployment.be  This test is not yet approved or cleared by the [place] FDA and has been authorized for detection and/or diagnosis of SARS-CoV-2 by FDA under an Emergency Use Authorization (EUA). This EUA will remain in effect (meaning this test can be used) for the duration of the COVID-19 declaration under Section 564(b)(1) of the Act, 21 U.S.C. section 360bbb-3(b)(1), unless the authorization is terminated or revoked.  Performed at [facility], [place] [address] [place], [place] [address]       Studies: No results found.    Flora Lipps, MD  [facility] 03/20/2021  If 7PM-7AM, please contact night-coverage

## 2021-03-20 NOTE — Progress Notes (Signed)
[facility] Progress Note  Summary: 87 y.o. year-old w/ hx of COPD, CAD hx stenting, sp CVA, HTN, CKD 3b who presented from home after recent admit for LE cellulitis.  Pt c/o unable to walk, gen'd weakness in LE's, abd distension. In ED pt had worsening LE edema and erythema w/ weeping wounds. Creat had worsened to 4.8 from b/l creat 1.9- 2.5.  Pt was admitted. Unna boots were added to the lower legs bilat.  Rocephin was started for cellulitis. Asked to see for renal faliure and volume overload.   Subjective: seen in room, nurses says had I/O cath x 2 w/ 600cc then 800 cc returned, alhtough the bladder scan only implicated around 680 cc both times. Pt w/o new c/o, no n/v or jerking /confusion.   Vitals:   03/19/21 2022 03/19/21 2035 03/20/21 0341 03/20/21 0454  BP:  (!) 142/74 (!) 166/72   Pulse:  90 88   Resp:  19 20   Temp:  98.7 F (37.1 C) 99.1 F (37.3 C)   TempSrc:  Oral Oral   SpO2: 93% 94% 90%   Weight:    98.4 kg  Height:        Exam: Gen alert, no distress No jvd or bruits Chest occ rales at bases, no wheezing, nasal O2 Bilat lower legs are wrapped, hips 2+ edema UE's 2+ bilat edema RRR no MRG Abd soft ntnd no mass or ascites +bs GU normal male w/ condom cath MS no joint effusions Neuro is alert, Ox 3 , nf     Home meds include - norvasc, asa, catapres, plavix, lasix 40 bid, norco prn, combivent, synthroid, neurontin, lopressor 25 qd, pravachol, sl ntg, prns/ vits / supps       Date                         Creat               eGFR    Jan -June 2022        1.81- 2.02 Jan 2021                  1.98                 33 ml/min, stage IIIb       Jan 10- 16, 2023      1.86- 2.93            Jan 23                      4.87   Jan 24                       4.93    Mar 19, 2020            4.80                        Alb 2.3  Co2 17  BUN 66  creat 4.80       BNP 281      Last CXR 1/15 = COPD      Renal US 1/24 - 11.4 and 9.6 cm kidneys w/o hydronephrosis.  Mod atrophy and ^'d echotexture bilaterally.      ECHO 03/08/20 - LVEF 65- 70%, G1DD       UNa 29, UCr 150  UA 21-50 rbcs/ 0-5 wbcs, prot > 300        UP/C ration from 03/06/21 prior admit = 381/67 = 5.64       Assessment/ Plan: AKI on CKD 3b - b/l creat from Nov 2022 1.98, eGFR 33 ml/min.  Here w/ AKI creat 4.8 in setting of weeping edema of LE's and UE's, volume overload, LE cellulitis possibly. Renal US shows chronic changes, no obstruction. UA w/ rbc's, high protein. Recent UP/C ratio high at 5 gms. AKI likely due to decompensated CHF/ vol overload.  For CKD could have glomerular disease and/or HTN'sive nephrosclerosis. No hx DM. Will order serologies for prognostication. Pt seems to be diuresing but having urinary retention. Cont IV Lasix 80 mg tid, place foley, send serologies. If needs HD will tx to [facility] but for now okay to stay at [facility].   HTN - okay to cont BP meds but keep SBP > 110 in setting of acute diuresis COPD H/o CVA H/o CAD sp stent Anemia - Hb 9's, follow          Rob Hays Dunnigan 03/20/2021, 6:44 AM   Recent Labs  Lab 03/19/21 0357 03/20/21 0340  K 4.4 4.9  BUN 66* 65*  CREATININE 4.80* 4.90*  CALCIUM 8.2* 8.3*  PHOS 5.3* 5.8*  HGB 9.5* 9.3*   Inpatient medications:  albuterol  2.5 mg Nebulization BID   amLODipine  10 mg Oral Daily   aspirin EC  81 mg Oral Daily   cloNIDine  0.1 mg Oral Daily   clopidogrel  75 mg Oral Daily   docusate sodium  100 mg Oral BID   ferrous sulfate  325 mg Oral BID WC   furosemide  80 mg Intravenous Q8H   heparin  5,000 Units Subcutaneous Q8H   hydrocerin   Topical Once per day on Mon Thu   levothyroxine  50 mcg Oral Q0600   metoprolol tartrate  25 mg Oral Daily   polyethylene glycol  17 g Oral Daily   pravastatin  40 mg Oral QHS    cefTRIAXone (ROCEPHIN)  IV 2 g (03/20/21 0300)   acetaminophen **OR** acetaminophen, albuterol, HYDROcodone-acetaminophen, simethicone

## 2021-03-21 DIAGNOSIS — J449 Chronic obstructive pulmonary disease, unspecified: Secondary | ICD-10-CM | POA: Diagnosis not present

## 2021-03-21 DIAGNOSIS — N179 Acute kidney failure, unspecified: Secondary | ICD-10-CM | POA: Diagnosis not present

## 2021-03-21 DIAGNOSIS — I4891 Unspecified atrial fibrillation: Secondary | ICD-10-CM | POA: Diagnosis not present

## 2021-03-21 DIAGNOSIS — J432 Centrilobular emphysema: Secondary | ICD-10-CM | POA: Diagnosis not present

## 2021-03-21 LAB — BASIC METABOLIC PANEL
Anion gap: 8 (ref 5–15)
BUN: 69 mg/dL — ABNORMAL HIGH (ref 8–23)
CO2: 19 mmol/L — ABNORMAL LOW (ref 22–32)
Calcium: 8.1 mg/dL — ABNORMAL LOW (ref 8.9–10.3)
Chloride: 112 mmol/L — ABNORMAL HIGH (ref 98–111)
Creatinine, Ser: 4.81 mg/dL — ABNORMAL HIGH (ref 0.61–1.24)
GFR, Estimated: 11 mL/min — ABNORMAL LOW (ref >60)
Glucose, Bld: 97 mg/dL (ref 70–99)
Potassium: 5.1 mmol/L (ref 3.5–5.1)
Sodium: 139 mmol/L (ref 135–145)

## 2021-03-21 LAB — CBC
HCT: 31.7 % — ABNORMAL LOW (ref 39.0–52.0)
Hemoglobin: 9.7 g/dL — ABNORMAL LOW (ref 13.0–17.0)
MCH: 29.1 pg (ref 26.0–34.0)
MCHC: 30.6 g/dL (ref 30.0–36.0)
MCV: 95.2 fL (ref 80.0–100.0)
Platelets: 264 10*3/uL (ref 150–400)
RBC: 3.33 MIL/uL — ABNORMAL LOW (ref 4.22–5.81)
RDW: 15.8 % — ABNORMAL HIGH (ref 11.5–15.5)
WBC: 7.8 10*3/uL (ref 4.0–10.5)
nRBC: 0 % (ref 0.0–0.2)

## 2021-03-21 LAB — HEPATITIS PANEL, ACUTE
HCV Ab: NONREACTIVE
Hep A IgM: NONREACTIVE
Hep B C IgM: NONREACTIVE
Hepatitis B Surface Ag: NONREACTIVE

## 2021-03-21 LAB — MAGNESIUM: Magnesium: 2.4 mg/dL (ref 1.7–2.4)

## 2021-03-21 MED ORDER — FUROSEMIDE 10 MG/ML IJ SOLN
120.0000 mg | Freq: Three times a day (TID) | INTRAVENOUS | Status: DC
  Administered 2021-03-21 – 2021-03-22 (×4): 120 mg via INTRAVENOUS
  Filled 2021-03-21 (×2): qty 10
  Filled 2021-03-21 (×2): qty 12
  Filled 2021-03-21: qty 2

## 2021-03-21 NOTE — Progress Notes (Signed)
PROGRESS NOTE  Wayne Meadows LKG:[id number] DOB: 1934-05-23 DOA: 03/17/2021 PCP: [facility] [place]   LOS: 3 days   Brief narrative:   Wayne Meadows is a 87 y.o. male with history of chronic kidney disease stage IIIb, CVA and CAD, diabetes mellitus, COPD, hypertension was recently admitted for cellulitis of the lower extremity, discharged on antibiotic presented to hospital with increasing weakness, difficulty ambulating with increasing redness and swelling of his lower extremities.  In the ED, patient was noted to have a weeping of his bilateral lower extremities with erythema.  Creatinine had worsened from 2.9 to 4.8 on presentation.   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   Assessment/Plan:  Principal Problem:   Acute renal failure (ARF) (HCC) Active Problems:   CAD (coronary artery disease)   COPD (chronic obstructive pulmonary disease) (HCC)   Atrial fibrillation (HCC)   Renal cell cancer (HCC)   Essential hypertension   Centrilobular emphysema (HCC)   AKI (acute kidney injury) ([place])   Hypothyroidism   ARF (acute renal failure) (HCC)   Acute on chronic kidney disease stage IV with non-anion gap metabolic acidosis- Urinalysis with proteinuria..  Patient with lower extremity edema.  Nephrology on board and has been started on IV Lasix 120 mg 3 times daily.  Patient did have urinary retention and required in and out catheterization.  Continue Foley catheter for strict intake and output charting.  Ultrasound of the kidneys showed atrophic and echogenic kidneys suggestive of chronic kidney disease.  Autoimmune work-up has been sent as well.  Follow nephrology recommendation.   Gross bilateral lower extremity edema anasarca/venous insufficiency.  Patient did have a recent ABI which was normal.  Dopplers were negative for DVT at that time.  Continue Unna boots.     CAD  History of coronary stent placement.  Continue Plavix statin and beta-blockers.  No acute issues at this time.  Right chest wall pain.  Likely musculoskeletal, patient has tenderness on palpation and especially painful on movement of the chest wall.  Continue Lidoderm patch, warm compress.  Monitor   Essential hypertension  Continue amlodipine clonidine and beta-blockers.  As needed IV hydralazine.   Hypothyroidism Continue Synthroid   COPD  Compensated at this time.  Continue bronchodilators as needed   Chronic anemia likely from renal disease, follow CBC   Debility, deconditioning. Patient lives by himself at home and has a walker and cane at home.  Physical therapy has recommended skilled nursing facility on discharge.  DVT prophylaxis: heparin injection 5,000 Units Start: 03/18/21 0600   Code Status: Full code  Family Communication:  I spoke with the patient's daughter on the phone on 03/19/2021  Status is: Inpatient  Remains inpatient appropriate because: Worsening CKD, fluid overload, IV diuretics  Consultants: Nephrology  Procedures: None  Anti-infectives:  Rocephin IV  Anti-infectives (From admission, onward)    Start     Dose/Rate Route Frequency Ordered Stop   03/18/21 0200  cefTRIAXone (ROCEPHIN) 2 g in sodium chloride 0.9 % 100 mL IVPB  Status:  Discontinued        2 g 200 mL/hr over 30 Minutes Intravenous Every 24 hours 03/18/21 0158 03/20/21 1542      Subjective:  Today, patient was seen and examined at bedside.  Complains of mild nausea.  Has diminished appetite.  Has not had a bowel movement.  Denies abdominal pain.  No shortness of breath  cough or fever.    Vitals:   03/20/21 2104 03/21/21 0437  BP: (!) 155/70 (!) 163/75  Pulse: 85 86  Resp: 18 18  Temp: 98.4 F (36.9 C) 98.4 F (36.9 C)  SpO2: 92% 91%    Intake/Output Summary (Last 24 hours) at 03/21/2021 1230 Last data filed at 03/21/2021 1000 Gross per 24 hour  Intake 480  ml  Output 1930 ml  Net -1450 ml    Filed Weights   03/19/21 0500 03/20/21 0454 03/21/21 0437  Weight: 99.8 kg 98.4 kg 98.7 kg   Body mass index is 27.94 kg/m.   Physical Exam:  General:  Average built, not in obvious distress, on nasal cannula oxygen, HENT:   No scleral pallor or icterus noted. Oral mucosa is moist.  Chest:  Clear breath sounds.  Diminished breath sounds bilaterally. No crackles or wheezes.  CVS: S1 &S2 heard. No murmur.  Regular rate and rhythm. Abdomen: Soft, nontender, nondistended.  Bowel sounds are heard.  Foley catheter in place. Extremities: bilateral lower extremity edema with Unna boot at this time. Psych: Alert, awake and communicative, slow to respond. CNS:  No cranial nerve deficits.  Moves all extremities. Skin: Warm and dry.  No rashes noted.   Data Review: I have personally reviewed the following laboratory data and studies,  CBC: Recent Labs  Lab 03/17/21 2155 03/18/21 0418 03/19/21 0357 03/20/21 0340 03/21/21 0409  WBC 9.8 9.4 8.3 8.3 7.8  NEUTROABS 7.1  --   --   --   --   HGB 9.9* 9.7* 9.5* 9.3* 9.7*  HCT 31.3* 30.2* 29.8* 29.6* 31.7*  MCV 94.0 93.2 92.3 93.7 95.2  PLT 295 258 252 245 818    Basic Metabolic Panel: Recent Labs  Lab 03/17/21 2155 03/18/21 0418 03/19/21 0357 03/20/21 0340 03/21/21 0409  NA 139 140 140 140 139  K 4.5 4.3 4.4 4.9 5.1  CL 115* 115* 114* 114* 112*  CO2 17* 17* 17* 16* 19*  GLUCOSE 111* 103* 118* 84 97  BUN 57* 59* 66* 65* 69*  CREATININE 4.87* 4.93* 4.80* 4.90* 4.81*  CALCIUM 8.3* 8.1* 8.2* 8.3* 8.1*  MG 2.3  --  2.5* 2.1 2.4  PHOS  --   --  5.3* 5.8*  --     Liver Function Tests: Recent Labs  Lab 03/17/21 2155 03/19/21 0357 03/20/21 0340  AST 15 13* 14*  ALT 10 8 8   ALKPHOS 75 61 64  BILITOT 0.6 0.5 0.5  PROT 6.6 6.1* 6.0*  ALBUMIN 2.5* 2.3* 2.2*    No results for input(s): LIPASE, AMYLASE in the last 168 hours. No results for input(s): AMMONIA in the last 168 hours. Cardiac  Enzymes: Recent Labs  Lab 03/18/21 0418  CKTOTAL 44*    BNP (last 3 results) Recent Labs    03/07/21 1714 03/08/21 0509 03/17/21 2155  BNP 931.2* 915.5* 280.9*     ProBNP (last 3 results) No results for input(s): PROBNP in the last 8760 hours.  CBG: No results for input(s): GLUCAP in the last 168 hours. Recent Results (from the past 240 hour(s))  Resp Panel by RT-PCR (Flu A&B, Covid) Nasopharyngeal Swab     Status: None   Collection Time: 03/17/21 10:08 PM   Specimen: Nasopharyngeal Swab; Nasopharyngeal(NP) swabs in vial transport medium  Result Value Ref Range Status   SARS Coronavirus 2 by RT PCR NEGATIVE NEGATIVE Final    Comment: (NOTE) SARS-CoV-2 target nucleic acids are NOT DETECTED.  The SARS-CoV-2 RNA is  generally detectable in upper respiratory specimens during the acute phase of infection. The lowest concentration of SARS-CoV-2 viral copies this assay can detect is 138 copies/mL. A negative result does not preclude SARS-Cov-2 infection and should not be used as the sole basis for treatment or other patient management decisions. A negative result may occur with  improper specimen collection/handling, submission of specimen other than nasopharyngeal swab, presence of viral mutation(s) within the areas targeted by this assay, and inadequate number of viral copies(<138 copies/mL). A negative result must be combined with clinical observations, patient history, and epidemiological information. The expected result is Negative.  Fact Sheet for Patients:  EntrepreneurPulse.com.au  Fact Sheet for Healthcare Providers:  IncredibleEmployment.be  This test is no t yet approved or cleared by the [place] FDA and  has been authorized for detection and/or diagnosis of SARS-CoV-2 by FDA under an Emergency Use Authorization (EUA). This EUA will remain  in effect (meaning this test can be used) for the duration of the COVID-19  declaration under Section 564(b)(1) of the Act, 21 U.S.C.section 360bbb-3(b)(1), unless the authorization is terminated  or revoked sooner.       Influenza A by PCR NEGATIVE NEGATIVE Final   Influenza B by PCR NEGATIVE NEGATIVE Final    Comment: (NOTE) The Xpert Xpress SARS-CoV-2/FLU/RSV plus assay is intended as an aid in the diagnosis of influenza from Nasopharyngeal swab specimens and should not be used as a sole basis for treatment. Nasal washings and aspirates are unacceptable for Xpert Xpress SARS-CoV-2/FLU/RSV testing.  Fact Sheet for Patients: EntrepreneurPulse.com.au  Fact Sheet for Healthcare Providers: IncredibleEmployment.be  This test is not yet approved or cleared by the [place] FDA and has been authorized for detection and/or diagnosis of SARS-CoV-2 by FDA under an Emergency Use Authorization (EUA). This EUA will remain in effect (meaning this test can be used) for the duration of the COVID-19 declaration under Section 564(b)(1) of the Act, 21 U.S.C. section 360bbb-3(b)(1), unless the authorization is terminated or revoked.  Performed at [facility], [place] [address], [place], [place] [address]       Studies: No results found.    Flora Lipps, MD  [facility] 03/21/2021  If 7PM-7AM, please contact night-coverage

## 2021-03-21 NOTE — TOC Progression Note (Signed)
Transition of Care [facility]) - Progression Note    Patient Details  Name: BLAIN HUNSUCKER MRN: [id number] Date of Birth: 01-14-1935  [facility] of [facility] CM/SW Contact  Purcell Mouton, RN Phone Number: 03/21/2021, 10:02 AM  Clinical Narrative:    [name], PCP Dr. Marlon Pel, CSW [facility] [place].    Expected Discharge Plan: [place] Barriers to Discharge: No Barriers Identified  Expected Discharge Plan and Services Expected Discharge Plan: [place]   Discharge Planning Services: CM Consult Post Acute Care Choice: [place] Living arrangements for the past 2 months: Single Family Home                                       Social Determinants of Health (SDOH) Interventions    Readmission Risk Interventions Readmission Risk Prevention Plan 03/19/2021  SW Recovery Care/Counseling Consult Complete  Skilled Nursing Facility Complete  Some recent data might be hidden

## 2021-03-21 NOTE — Progress Notes (Signed)
Physical Therapy Treatment Patient Details Name: Wayne Meadows MRN: [id number] DOB: 10/30/1934 Today's Date: 03/21/2021   History of Present Illness 87 y.o. male recently admitted for cellulitis of the lower extremity discharged on 1/17 on antibiotics. Pt presented to hospital with increasing weakness, difficulty ambulating with increasing redness and swelling of his lower extremities.  In the ED, patient was noted to have a weeping of his bilateral lower extremities with erythema.  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  PMH significant for HTN, CAD with stents, history of CVA, COPD (not on home O2), arthritis, tobacco abuse in remission presented in the ED with complaints of pain, swelling and redness of both lower extremities. Dx of recurrent cellulitis and ARF.    PT Comments    General Comments: AxO x 3 flat affect and required MAX encouragement to participate.  Biggest c/o was his back pain. He really did not want to get OOB.  General bed mobility comments: due to back pain assisted with partial "lof Roll" side roll to transition to EOB.  Pt required MAX encouragement and Max Assist. General transfer comment: pt required + 2 side by side assist to rise from elevated bed.  "My legs wont hold me".  Stated pt.  "I'm weak". Pt was able to pivot 1/4 turn to recliner but quickly sat due to fatigue. Pt remained on 2 lts nasal.  Positioned in recliner with multiple pillows as pt stated he sleeps in his recliner because he can breath better. Pt will need ST Rehab at SNF prior to returning home to address his mobility decline.   Recommendations for follow up therapy are one component of a multi-disciplinary discharge planning process, led by the attending physician.  Recommendations may be updated based on patient status, additional functional criteria and insurance authorization.  Follow Up  Recommendations  Skilled nursing-short term rehab (<3 hours/day)     Assistance Recommended at Discharge Frequent or constant Supervision/Assistance  Patient can return home with the following A little help with walking and/or transfers;Assistance with cooking/housework;Assist for transportation;Help with stairs or ramp for entrance   Equipment Recommendations  None recommended by PT    Recommendations for Other Services       Precautions / Restrictions Precautions Precautions: Fall Precaution Comments: monitor sats Restrictions Weight Bearing Restrictions: No     Mobility  Bed Mobility Overal bed mobility: Needs Assistance Bed Mobility: Sidelying to Sit, Rolling Rolling: Mod assist Sidelying to sit: Max assist       General bed mobility comments: due to back pain assisted with partial "lof Roll" side roll to transition to EOB.  Pt required MAX encouragement and Max Assist.    Transfers Overall transfer level: Needs assistance Equipment used: Rolling walker (2 wheels) Transfers: Sit to/from Stand Sit to Stand: Min assist, +2 physical assistance, +2 safety/equipment, From elevated surface, Mod assist           General transfer comment: pt required + 2 side by side assist to rise from elevated bed.  "My legs wont hold me".  Stated pt.  "I'm weak". Pt was able to pivot 1/4 turn to recliner but quickly sat due to fatigue.    Ambulation/Gait               General Gait Details: transfers only this session due to increased c/o weakness and back pain.   Stairs  Wheelchair Mobility    Modified Rankin (Stroke Patients Only)       Balance                                            Cognition Arousal/Alertness: Awake/alert Behavior During Therapy: WFL for tasks assessed/performed Overall Cognitive Status: Within Functional Limits for tasks assessed                                 General Comments: AxO x 3  flat affect and required MAX encouragement to participate.  Biggest c/o was his back pain.        Exercises      General Comments        Pertinent Vitals/Pain Pain Assessment Pain Assessment: Faces Faces Pain Scale: Hurts even more Pain Location: back pain Pain Descriptors / Indicators: Discomfort, Grimacing, Stabbing Pain Intervention(s): Monitored during session, Repositioned    Home Living                          Prior Function            PT Goals (current goals can now be found in the care plan section) Progress towards PT goals: Progressing toward goals    Frequency    Min 3X/week      PT Plan Current plan remains appropriate    Co-evaluation              AM-PAC PT "6 Clicks" Mobility   Outcome Measure  Help needed turning from your back to your side while in a flat bed without using bedrails?: A Lot Help needed moving from lying on your back to sitting on the side of a flat bed without using bedrails?: A Lot Help needed moving to and from a bed to a chair (including a wheelchair)?: A Lot Help needed standing up from a chair using your arms (e.g., wheelchair or bedside chair)?: A Lot Help needed to walk in hospital room?: Total Help needed climbing 3-5 steps with a railing? : Total 6 Click Score: 10    End of Session Equipment Utilized During Treatment: Gait belt Activity Tolerance: Patient tolerated treatment well Patient left: in chair;with call bell/phone within reach;with chair alarm set Nurse Communication: Mobility status PT Visit Diagnosis: Difficulty in walking, not elsewhere classified (R26.2);Pain     Time: 5929-2446 PT Time Calculation (min) (ACUTE ONLY): 28 min  Charges:  $Therapeutic Activity: 23-37 mins                     {Breaunna Gottlieb  PTA Acute  Rehabilitation Services Pager      [phone] Office      [phone]

## 2021-03-21 NOTE — Progress Notes (Signed)
[facility] Progress Note  Summary: 87 y.o. year-old w/ hx of COPD, CAD hx stenting, sp CVA, HTN, CKD 3b who presented from home after recent admit for LE cellulitis.  Pt c/o unable to walk, gen'd weakness in LE's, abd distension. In ED pt had worsening LE edema and erythema w/ weeping wounds. Creat had worsened to 4.8 from b/l creat 1.9- 2.5.  Pt was admitted. Unna boots were added to the lower legs bilat.  Rocephin was started for cellulitis. Asked to see for renal faliure and volume overload.   Subjective: seen in room, states arms are improving some w/ respect to swelling. Creat up 4.8, UOP better yest at 2100.    Vitals:   03/20/21 1253 03/20/21 1600 03/20/21 2104 03/21/21 0437  BP: (!) 178/74 (!) 152/80 (!) 155/70 (!) 163/75  Pulse: 83  85 86  Resp: 16  18 18   Temp: 98 F (36.7 C)  98.4 F (36.9 C) 98.4 F (36.9 C)  TempSrc: Oral  Oral Oral  SpO2: 92%  92% 91%  Weight:    98.7 kg  Height:        Exam: Gen alert, no distress No jvd or bruits Chest occ rales at bases, no wheezing, nasal O2 Bilat lower legs wrapped, hips 2+ edema UE's 2+ bilat edema RRR no MRG Abd soft ntnd no mass or ascites +bs GU normal male w/ condom cath Neuro is alert, Ox 3 , nf     Home meds include - norvasc, asa, catapres, plavix, lasix 40 bid, norco prn, combivent, synthroid, neurontin, lopressor 25 qd, pravachol, sl ntg, prns/ vits / supps       Date                         Creat               eGFR    Jan -June 2022        1.81- 2.02 Jan 2021                  1.98                 33 ml/min, stage IIIb       Jan 10- 16, 2023      1.86- 2.93            Jan 23                      4.87   Jan 24                       4.93    Mar 19, 2020            4.80                        Alb 2.3  Co2 17  BUN 66  creat 4.80       BNP 281      Last CXR 1/15 = COPD      Renal US 1/24 - 11.4 and 9.6 cm kidneys w/o hydronephrosis. Mod atrophy and ^'d echotexture bilaterally.      ECHO  03/08/20 - LVEF 65- 70%, G1DD       UNa 29, UCr 150        UA 21-50 rbcs/ 0-5 wbcs, prot > 300  UP/C ration from 03/06/21 prior admit = 381/67 = 5.64       Assessment/ Plan: AKI on CKD 3b - b/l creat from Nov 2022 1.98, eGFR 33 ml/min.  Here w/ AKI creat 4.8 in setting of weeping edema of LE's and UE's, volume overload, LE cellulitis possibly. Renal US shows chronic changes, no obstruction. UA w/ rbc's, high protein. Recent UP/C ratio high at 5 gms. AKI likely due to decompensated CHF/ vol overload. CKD could be glomerular disease and/or HTN'sive nephrosclerosis. No hx DM. Foley placed for urinary retention. Sent serologies for proteinuria 5 gm, doubtful he would be candidate for immunosuppression though. I am concerned he may progress to needing dialysis. He was taking care of himself prior to admission. Have d/w family and pt. Cont IV lasix for now, will ^120 tid.  HTN - okay to cont BP meds but keep SBP > 110 in setting of acute diuresis COPD H/o CVA H/o CAD sp stent Anemia - Hb 9's, follow          Rob Kairos Panetta 03/21/2021, 10:14 AM   Recent Labs  Lab 03/19/21 0357 03/20/21 0340 03/21/21 0409  K 4.4 4.9 5.1  BUN 66* 65* 69*  CREATININE 4.80* 4.90* 4.81*  CALCIUM 8.2* 8.3* 8.1*  PHOS 5.3* 5.8*  --   HGB 9.5* 9.3* 9.7*    Inpatient medications:  amLODipine  10 mg Oral Daily   aspirin EC  81 mg Oral Daily   Chlorhexidine Gluconate Cloth  6 each Topical Daily   cloNIDine  0.1 mg Oral Daily   clopidogrel  75 mg Oral Daily   docusate sodium  100 mg Oral BID   ferrous sulfate  325 mg Oral BID WC   heparin  5,000 Units Subcutaneous Q8H   hydrocerin   Topical Once per day on Mon Thu   levothyroxine  50 mcg Oral Q0600   lidocaine  1 patch Transdermal Q24H   metoprolol tartrate  25 mg Oral Daily   polyethylene glycol  17 g Oral Daily   pravastatin  40 mg Oral QHS    furosemide     acetaminophen **OR** acetaminophen, albuterol, HYDROcodone-acetaminophen, ondansetron  (ZOFRAN) IV, simethicone

## 2021-03-21 NOTE — TOC Progression Note (Signed)
Transition of Care [facility]) - Progression Note    Patient Details  Name: Wayne Meadows MRN: [id number] Date of Birth: 09-Sep-1934  Transition of Care [facility]) CM/SW Contact  Purcell Mouton, RN Phone Number: 03/21/2021, 10:05 AM  Clinical Narrative:    Informed pt that [facility] offered him a bed. Pt states that he will need to speak to his daughter.   Expected Discharge Plan: [place] Barriers to Discharge: No Barriers Identified  Expected Discharge Plan and Services Expected Discharge Plan: [place]   Discharge Planning Services: CM Consult Post Acute Care Choice: [place] Living arrangements for the past 2 months: Single Family Home                                       Social Determinants of Health (SDOH) Interventions    Readmission Risk Interventions Readmission Risk Prevention Plan 03/19/2021  SW Recovery Care/Counseling Consult Complete  Skilled Nursing Facility Complete  Some recent data might be hidden

## 2021-03-22 DIAGNOSIS — N179 Acute kidney failure, unspecified: Secondary | ICD-10-CM | POA: Diagnosis not present

## 2021-03-22 DIAGNOSIS — I4891 Unspecified atrial fibrillation: Secondary | ICD-10-CM | POA: Diagnosis not present

## 2021-03-22 DIAGNOSIS — J432 Centrilobular emphysema: Secondary | ICD-10-CM | POA: Diagnosis not present

## 2021-03-22 DIAGNOSIS — J449 Chronic obstructive pulmonary disease, unspecified: Secondary | ICD-10-CM | POA: Diagnosis not present

## 2021-03-22 LAB — ANTI-DNA ANTIBODY, DOUBLE-STRANDED: ds DNA Ab: 2 IU/mL (ref 0–9)

## 2021-03-22 MED ORDER — FUROSEMIDE 10 MG/ML IJ SOLN
160.0000 mg | Freq: Three times a day (TID) | INTRAVENOUS | Status: DC
  Administered 2021-03-22 – 2021-03-23 (×2): 160 mg via INTRAVENOUS
  Filled 2021-03-22: qty 10
  Filled 2021-03-22: qty 16
  Filled 2021-03-22: qty 10

## 2021-03-22 MED ORDER — METOLAZONE 5 MG PO TABS
10.0000 mg | ORAL_TABLET | Freq: Every day | ORAL | Status: DC
  Administered 2021-03-22: 10 mg via ORAL
  Filled 2021-03-22 (×2): qty 2

## 2021-03-22 NOTE — Progress Notes (Signed)
PROGRESS NOTE  CHICO CAWOOD NLZ:[id number] DOB: 05/10/1934 DOA: 03/17/2021 PCP: [facility]   LOS: 4 days   Brief narrative:   Wayne Meadows is a 87 y.o. male with history of chronic kidney disease stage IIIb, CVA and CAD, diabetes mellitus, COPD, hypertension was recently admitted for cellulitis of the lower extremity, discharged on antibiotic presented to hospital with increasing weakness, difficulty ambulating with increasing redness and swelling of his lower extremities.  In the ED, patient was noted to have a weeping of his bilateral lower extremities with erythema.  Creatinine had worsened from 2.9 to 4.8 on presentation.   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   Assessment/Plan:  Principal Problem:   Acute renal failure (ARF) (HCC) Active Problems:   CAD (coronary artery disease)   COPD (chronic obstructive pulmonary disease) (HCC)   Atrial fibrillation (HCC)   Renal cell cancer (HCC)   Essential hypertension   Centrilobular emphysema (HCC)   AKI (acute kidney injury) ([place])   Hypothyroidism   ARF (acute renal failure) (HCC)   Acute on chronic kidney disease stage IV with non-anion gap metabolic acidosis- Urinalysis with proteinuria. Nephrology on board and is on IV Lasix 120 mg 3 times daily.  Patient did have urinary retention and required in and out catheterization.  Continue Foley catheter for strict intake and output charting.  Ultrasound of the kidneys showed atrophic and echogenic kidneys suggestive of chronic kidney disease.  Autoimmune work-up has been sent as well.  Follow nephrology recommendation.  Patient is negative balance for 2881 mL so far.   Gross bilateral lower extremity edema anasarca/venous insufficiency.  Patient did have a recent ABI which was normal.  Dopplers were negative for DVT at that time.  Continue Unna boots.    CAD   History of coronary stent placement.  Continue Plavix statin and beta-blockers.  No acute issues at this time.  Right chest wall pain.  Improved with supportive care  Essential hypertension  Continue amlodipine clonidine and beta-blockers.  As needed IV hydralazine.   Hypothyroidism Continue Synthroid   COPD  Compensated at this time.  Continue bronchodilators as needed has an occasional cough.   Chronic anemia likely from renal disease, follow CBC.  Hemoglobin of 9.7.  Debility, deconditioning. Patient lives by himself at home and has a walker and cane at home.  Physical therapy has recommended skilled nursing facility on discharge.  DVT prophylaxis: heparin injection 5,000 Units Start: 03/18/21 0600   Code Status: Full code  Family Communication:  I spoke with the patient's daughter on the phone on 03/19/2021  Status is: Inpatient   Remains inpatient appropriate because: Worsening CKD, fluid overload, IV diuretics  Consultants: Nephrology  Procedures: None  Anti-infectives:  Rocephin IV  Anti-infectives (From admission, onward)    Start     Dose/Rate Route Frequency Ordered Stop   03/18/21 0200  cefTRIAXone (ROCEPHIN) 2 g in sodium chloride 0.9 % 100 mL IVPB  Status:  Discontinued        2 g 200 mL/hr over 30 Minutes Intravenous Every 24 hours 03/18/21 0158 03/20/21 1542      Subjective:  Today, patient was seen and examined at bedside.  Complains of mild cough.  Slightly constipated.  Denies any fever chills or rigor.   Vitals:   03/22/21 0732 03/22/21 0800  BP:  (!) 165/72  Pulse:  91  Resp:  18  Temp:  98.2 F (36.8 C)  SpO2: 90% 91%    Intake/Output Summary (Last 24 hours) at 03/22/2021 1147 Last data filed at 03/22/2021 1058 Gross per 24 hour  Intake 344 ml  Output 1625 ml  Net -1281 ml    Filed Weights   03/21/21 0437 03/22/21 0600 03/22/21 0800  Weight: 98.7 kg 101.4 kg 99.3 kg   Body mass index is 28.11 kg/m.   Physical  Exam:  General:  Average built, not in obvious distress, on 3 L of oxygen by nasal cannula. HENT:   No scleral pallor or icterus noted. Oral mucosa is moist.  Chest:   Diminished breath sounds bilaterally. es.  CVS: S1 &S2 heard. No murmur.  Regular rate and rhythm. Abdomen: Soft, nontender, nondistended.  Bowel sounds are heard.   Extremities: No cyanosis, clubbing with lower extremity edema on Unna boot.  Peripheral pulses are palpable. Psych: Alert, awake and oriented, slow to respond. CNS:  No cranial nerve deficits.  Moves all extremities. Skin: Warm and dry.  No rashes noted.   Data Review: I have personally reviewed the following laboratory data and studies,  CBC: Recent Labs  Lab 03/17/21 2155 03/18/21 0418 03/19/21 0357 03/20/21 0340 03/21/21 0409  WBC 9.8 9.4 8.3 8.3 7.8  NEUTROABS 7.1  --   --   --   --   HGB 9.9* 9.7* 9.5* 9.3* 9.7*  HCT 31.3* 30.2* 29.8* 29.6* 31.7*  MCV 94.0 93.2 92.3 93.7 95.2  PLT 295 258 252 245 295    Basic Metabolic Panel: Recent Labs  Lab 03/17/21 2155 03/18/21 0418 03/19/21 0357 03/20/21 0340 03/21/21 0409  NA 139 140 140 140 139  K 4.5 4.3 4.4 4.9 5.1  CL 115* 115* 114* 114* 112*  CO2 17* 17* 17* 16* 19*  GLUCOSE 111* 103* 118* 84 97  BUN 57* 59* 66* 65* 69*  CREATININE 4.87* 4.93* 4.80* 4.90* 4.81*  CALCIUM 8.3* 8.1* 8.2* 8.3* 8.1*  MG 2.3  --  2.5* 2.1 2.4  PHOS  --   --  5.3* 5.8*  --     Liver Function Tests: Recent Labs  Lab 03/17/21 2155 03/19/21 0357 03/20/21 0340  AST 15 13* 14*  ALT 10 8 8   ALKPHOS 75 61 64  BILITOT 0.6 0.5 0.5  PROT 6.6 6.1* 6.0*  ALBUMIN 2.5* 2.3* 2.2*    No results for input(s): LIPASE, AMYLASE in the last 168 hours. No results for input(s): AMMONIA in the last 168 hours. Cardiac Enzymes: Recent Labs  Lab 03/18/21 0418  CKTOTAL 44*    BNP (last 3 results) Recent Labs    03/07/21 1714 03/08/21 0509 03/17/21 2155  BNP 931.2* 915.5* 280.9*     ProBNP (last 3  results) No results for input(s): PROBNP in the last 8760 hours.  CBG: No results for input(s): GLUCAP in the last 168 hours. Recent Results (from the past 240 hour(s))  Resp Panel by RT-PCR (Flu A&B, Covid) Nasopharyngeal Swab     Status: None   Collection Time: 03/17/21 10:08 PM   Specimen: Nasopharyngeal Swab; Nasopharyngeal(NP) swabs in vial transport medium  Result Value Ref Range Status   SARS Coronavirus 2 by RT PCR NEGATIVE NEGATIVE Final    Comment: (NOTE) SARS-CoV-2 target nucleic acids are NOT DETECTED.  The SARS-CoV-2 RNA is generally detectable in upper respiratory specimens during the acute phase of infection. The lowest concentration of SARS-CoV-2 viral copies this assay can detect is 138 copies/mL. A negative result does not  preclude SARS-Cov-2 infection and should not be used as the sole basis for treatment or other patient management decisions. A negative result may occur with  improper specimen collection/handling, submission of specimen other than nasopharyngeal swab, presence of viral mutation(s) within the areas targeted by this assay, and inadequate number of viral copies(<138 copies/mL). A negative result must be combined with clinical observations, patient history, and epidemiological information. The expected result is Negative.  Fact Sheet for Patients:  EntrepreneurPulse.com.au  Fact Sheet for Healthcare Providers:  IncredibleEmployment.be  This test is no t yet approved or cleared by the [place] FDA and  has been authorized for detection and/or diagnosis of SARS-CoV-2 by FDA under an Emergency Use Authorization (EUA). This EUA will remain  in effect (meaning this test can be used) for the duration of the COVID-19 declaration under Section 564(b)(1) of the Act, 21 U.S.C.section 360bbb-3(b)(1), unless the authorization is terminated  or revoked sooner.       Influenza A by PCR NEGATIVE NEGATIVE Final    Influenza B by PCR NEGATIVE NEGATIVE Final    Comment: (NOTE) The Xpert Xpress SARS-CoV-2/FLU/RSV plus assay is intended as an aid in the diagnosis of influenza from Nasopharyngeal swab specimens and should not be used as a sole basis for treatment. Nasal washings and aspirates are unacceptable for Xpert Xpress SARS-CoV-2/FLU/RSV testing.  Fact Sheet for Patients: EntrepreneurPulse.com.au  Fact Sheet for Healthcare Providers: IncredibleEmployment.be  This test is not yet approved or cleared by the [place] FDA and has been authorized for detection and/or diagnosis of SARS-CoV-2 by FDA under an Emergency Use Authorization (EUA). This EUA will remain in effect (meaning this test can be used) for the duration of the COVID-19 declaration under Section 564(b)(1) of the Act, 21 U.S.C. section 360bbb-3(b)(1), unless the authorization is terminated or revoked.  Performed at [facility], [place] [address] [place], [place] [address]       Studies: No results found.    Flora Lipps, MD  [facility] 03/22/2021  If 7PM-7AM, please contact night-coverage

## 2021-03-22 NOTE — Progress Notes (Signed)
[facility] Progress Note  Summary: 87 y.o. year-old w/ hx of COPD, CAD hx stenting, sp CVA, HTN, CKD 3b who presented from home after recent admit for LE cellulitis.  Pt c/o unable to walk, gen'd weakness in LE's, abd distension. In ED pt had worsening LE edema and erythema w/ weeping wounds. Creat had worsened to 4.8 from b/l creat 1.9- 2.5.  Pt was admitted. Unna boots were added to the lower legs bilat.  Rocephin was started for cellulitis. Asked to see for renal failure and volume overload.   Subjective: seen in room, feels like swelling in arms is improving  Vitals:   03/22/21 0715 03/22/21 0732 03/22/21 0800 03/22/21 1200  BP:   (!) 165/72 (!) 154/70  Pulse:   91 73  Resp:   18 18  Temp:   98.2 F (36.8 C) 98.1 F (36.7 C)  TempSrc:   Oral Oral  SpO2: (!) 88% 90% 91% 94%  Weight:   99.3 kg   Height:   6' 2"  (1.88 m)     Exam: Gen alert, no distress No jvd or bruits Chest occ rales at bases, no wheezing, nasal O2 Bilat lower legs wrapped, hips 2+ edema UE's 2+ bilat edema RRR no MRG Abd soft ntnd no mass or ascites +bs GU normal male w/ condom cath Neuro is alert, Ox 3 , nf     Home meds include - norvasc, asa, catapres, plavix, lasix 40 bid, norco prn, combivent, synthroid, neurontin, lopressor 25 qd, pravachol, sl ntg, prns/ vits / supps       Date                         Creat               eGFR    Jan -June 2022        1.81- 2.02 Jan 2021                  1.98                 33 ml/min, stage IIIb       Jan 10- 16, 2023      1.86- 2.93            Jan 23                      4.87   Jan 24                       4.93    Mar 19, 2020            4.80                        Alb 2.3  Co2 17  BUN 66  creat 4.80       BNP 281      Last CXR 1/15 = COPD      Renal US 1/24 - 11.4 and 9.6 cm kidneys w/o hydronephrosis. Mod atrophy and ^'d echotexture bilaterally.      ECHO 03/08/20 - LVEF 65- 70%, G1DD       UNa 29, UCr 150        UA 21-50 rbcs/ 0-5  wbcs, prot > 300        UP/C ration from 03/06/21 prior admit = 381/67 = 5.64  Assessment/ Plan: AKI on CKD 3b - b/l creat from Nov 2022 1.98, eGFR 33 ml/min.  Here w/ AKI creat 4.8 in setting of weeping edema of LE's and UE's, volume overload, LE cellulitis possibly. Renal US shows chronic changes, no obstruction. UA w/ rbc's, high protein. Recent UP/C ratio high at 5 gms. AKI likely due to decompensated CHF/ vol overload. CKD could be glomerular disease and/or HTN'sive nephrosclerosis. No hx DM. Foley placed for urinary retention. Sent serologies for proteinuria just for prognosis sake, don't think he would be candidate for immunosuppression. I am concerned he may progress to needing dialysis, but no indication yet.  He is elderly but was taking care of himself prior to admission. Have d/w family and pt. Regarding diuresis, he feels better but wt's aren't down and still edematous. Will add zaroxolyn po, and ^ IV lasix to 160 tid.  HTN - okay to cont BP meds but keep SBP > 110 in setting of acute diuresis COPD H/o CVA H/o CAD sp stent Anemia - Hb 9's, follow          Rob Dymphna Wadley 03/22/2021, 3:19 PM   Recent Labs  Lab 03/19/21 0357 03/20/21 0340 03/21/21 0409  K 4.4 4.9 5.1  BUN 66* 65* 69*  CREATININE 4.80* 4.90* 4.81*  CALCIUM 8.2* 8.3* 8.1*  PHOS 5.3* 5.8*  --   HGB 9.5* 9.3* 9.7*    Inpatient medications:  amLODipine  10 mg Oral Daily   aspirin EC  81 mg Oral Daily   Chlorhexidine Gluconate Cloth  6 each Topical Daily   cloNIDine  0.1 mg Oral Daily   clopidogrel  75 mg Oral Daily   docusate sodium  100 mg Oral BID   ferrous sulfate  325 mg Oral BID WC   heparin  5,000 Units Subcutaneous Q8H   hydrocerin   Topical Once per day on Mon Thu   levothyroxine  50 mcg Oral Q0600   lidocaine  1 patch Transdermal Q24H   metolazone  10 mg Oral Daily   metoprolol tartrate  25 mg Oral Daily   polyethylene glycol  17 g Oral Daily   pravastatin  40 mg Oral QHS     furosemide 120 mg (03/22/21 1400)   acetaminophen **OR** acetaminophen, albuterol, HYDROcodone-acetaminophen, ondansetron (ZOFRAN) IV, simethicone

## 2021-03-23 DIAGNOSIS — J449 Chronic obstructive pulmonary disease, unspecified: Secondary | ICD-10-CM | POA: Diagnosis not present

## 2021-03-23 DIAGNOSIS — I4891 Unspecified atrial fibrillation: Secondary | ICD-10-CM | POA: Diagnosis not present

## 2021-03-23 DIAGNOSIS — J432 Centrilobular emphysema: Secondary | ICD-10-CM | POA: Diagnosis not present

## 2021-03-23 DIAGNOSIS — N179 Acute kidney failure, unspecified: Secondary | ICD-10-CM | POA: Diagnosis not present

## 2021-03-23 LAB — BASIC METABOLIC PANEL
Anion gap: 9 (ref 5–15)
BUN: 71 mg/dL — ABNORMAL HIGH (ref 8–23)
CO2: 18 mmol/L — ABNORMAL LOW (ref 22–32)
Calcium: 8.4 mg/dL — ABNORMAL LOW (ref 8.9–10.3)
Chloride: 110 mmol/L (ref 98–111)
Creatinine, Ser: 5.33 mg/dL — ABNORMAL HIGH (ref 0.61–1.24)
GFR, Estimated: 10 mL/min — ABNORMAL LOW (ref >60)
Glucose, Bld: 82 mg/dL (ref 70–99)
Potassium: 5.2 mmol/L — ABNORMAL HIGH (ref 3.5–5.1)
Sodium: 137 mmol/L (ref 135–145)

## 2021-03-23 LAB — C4 COMPLEMENT: Complement C4, Body Fluid: 34 mg/dL (ref 12–38)

## 2021-03-23 LAB — CBC
HCT: 32.6 % — ABNORMAL LOW (ref 39.0–52.0)
Hemoglobin: 9.9 g/dL — ABNORMAL LOW (ref 13.0–17.0)
MCH: 29.1 pg (ref 26.0–34.0)
MCHC: 30.4 g/dL (ref 30.0–36.0)
MCV: 95.9 fL (ref 80.0–100.0)
Platelets: 238 10*3/uL (ref 150–400)
RBC: 3.4 MIL/uL — ABNORMAL LOW (ref 4.22–5.81)
RDW: 15.4 % (ref 11.5–15.5)
WBC: 6.3 10*3/uL (ref 4.0–10.5)
nRBC: 0 % (ref 0.0–0.2)

## 2021-03-23 LAB — ANTISTREPTOLYSIN O TITER: ASO: 75 IU/mL (ref 0.0–200.0)

## 2021-03-23 LAB — C3 COMPLEMENT: C3 Complement: 127 mg/dL (ref 82–167)

## 2021-03-23 LAB — MAGNESIUM: Magnesium: 2.3 mg/dL (ref 1.7–2.4)

## 2021-03-23 MED ORDER — HYDRALAZINE HCL 10 MG PO TABS
10.0000 mg | ORAL_TABLET | Freq: Three times a day (TID) | ORAL | Status: DC | PRN
  Administered 2021-03-24: 10 mg via ORAL
  Filled 2021-03-23: qty 1

## 2021-03-23 MED ORDER — HYDROCORTISONE 1 % EX CREA
TOPICAL_CREAM | Freq: Two times a day (BID) | CUTANEOUS | Status: DC | PRN
  Filled 2021-03-23 (×2): qty 28

## 2021-03-23 MED ORDER — FUROSEMIDE 10 MG/ML IJ SOLN
120.0000 mg | Freq: Two times a day (BID) | INTRAVENOUS | Status: DC
  Administered 2021-03-23 – 2021-03-25 (×4): 120 mg via INTRAVENOUS
  Filled 2021-03-23 (×2): qty 10
  Filled 2021-03-23 (×2): qty 12
  Filled 2021-03-23: qty 10
  Filled 2021-03-23: qty 12

## 2021-03-23 NOTE — Progress Notes (Addendum)
PROGRESS NOTE  KESHAWN Meadows NLG:[id number] DOB: 1934/12/29 DOA: 03/17/2021 PCP: [facility]   LOS: 5 days   Brief narrative:   Wayne Meadows is a 87 y.o. male with history of chronic kidney disease stage IIIb, CVA and CAD, diabetes mellitus, COPD, hypertension was recently admitted for cellulitis of the lower extremity, discharged on antibiotic presented to hospital with increasing weakness, difficulty ambulating with increasing redness and swelling of his lower extremities.  In the ED, patient was noted to have a weeping of his bilateral lower extremities with erythema.  Creatinine had worsened from 2.9 to 4.8 on presentation.   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   Assessment/Plan:  Principal Problem:   Acute renal failure (ARF) (HCC) Active Problems:   CAD (coronary artery disease)   COPD (chronic obstructive pulmonary disease) (HCC)   Atrial fibrillation (HCC)   Renal cell cancer (HCC)   Essential hypertension   Centrilobular emphysema (HCC)   AKI (acute kidney injury) ([place])   Hypothyroidism   ARF (acute renal failure) (HCC)   Acute on chronic kidney disease stage IV with non-anion gap metabolic acidosis- Urinalysis with proteinuria. Nephrology on board and is on IV Lasix 160 mg 3 times daily with Zaroxolyn..  Patient did have urinary retention and is currently on Foley catheter for strict intake and output charting.  Ultrasound of the kidneys showed atrophic and echogenic kidneys suggestive of chronic kidney disease.  Autoimmune work-up has been initiated in the hospital.  Follow nephrology recommendation.  Patient is negative balance for 3532 mL so far, rising up creatinine with elevated potassium at 5.2 today.   Gross bilateral lower extremity edema anasarca/venous insufficiency.  Patient did have a recent ABI which was normal.  Dopplers were  negative for DVT at that time.  Continue Unna boots.  Currently on high-dose IV diuretics.  CAD  History of coronary stent placement.  Continue Plavix statin and beta-blockers.  No acute issues at this time.  Right chest wall pain.  Improved with supportive care  Essential hypertension  Continue amlodipine clonidine and beta-blockers.  As needed IV hydralazine.  Blood pressure is still slightly elevated.   Hypothyroidism Continue Synthroid   COPD  Compensated at this time.  Continue bronchodilators   Chronic anemia likely from renal disease, follow CBC.  Hemoglobin of 9.9.  Debility, deconditioning. Patient lives by himself at home and has a walker and cane at home.  Physical therapy has recommended skilled nursing facility on discharge.  DVT prophylaxis: heparin injection 5,000 Units Start: 03/18/21 0600   Code Status: Full code  Family Communication:  I spoke with the patient's daughter on the phone on 03/23/2021 and updated her about the clinical condition of the patient.  Status is: Inpatient   Remains inpatient appropriate because: Worsening CKD, fluid overload, IV diuretics  Consultants: Nephrology  Procedures: None  Anti-infectives:  Rocephin IV  Anti-infectives (From admission, onward)    Start     Dose/Rate Route Frequency Ordered Stop   03/18/21 0200  cefTRIAXone (ROCEPHIN) 2 g in sodium chloride 0.9 % 100 mL IVPB  Status:  Discontinued        2 g 200 mL/hr over 30 Minutes Intravenous Every 24 hours 03/18/21 0158 03/20/21 1542      Subjective:  Today, patient was seen and examined at bedside.  Patient has been having some urinary output.  Denies any chest pain, shortness  of breath, nausea or vomiting.  Since still feels that he is constipated.  Vitals:   03/22/21 2041 03/23/21 0418  BP: (!) 160/74 (!) 165/65  Pulse: 78 80  Resp: 18 18  Temp: 98.3 F (36.8 C) 98.3 F (36.8 C)  SpO2: 93% 92%    Intake/Output Summary (Last 24 hours) at  03/23/2021 0940 Last data filed at 03/23/2021 0900 Gross per 24 hour  Intake 1309 ml  Output 2600 ml  Net -1291 ml    Filed Weights   03/22/21 0600 03/22/21 0800 03/23/21 0545  Weight: 101.4 kg 99.3 kg 97 kg   Body mass index is 27.45 kg/m.   Physical Exam:  General:  Average built, not in obvious distress, on nasal cannula oxygen, elderly male, HENT:   No scleral pallor or icterus noted. Oral mucosa is moist.  Chest:  Diminished breath sounds bilaterally.  CVS: S1 &S2 heard. No murmur.  Regular rate and rhythm. Abdomen: Soft, nontender, nondistended.  Bowel sounds are heard.   Extremities: No cyanosis, clubbing with Unna boot in place.  Peripheral pulses are palpable. Psych: Alert, awake and oriented, normal mood CNS:  No cranial nerve deficits.  Moves all extremities. Skin: Warm and dry.  No rashes noted.   Data Review: I have personally reviewed the following laboratory data and studies,  CBC: Recent Labs  Lab 03/17/21 2155 03/18/21 0418 03/19/21 0357 03/20/21 0340 03/21/21 0409 03/23/21 0354  WBC 9.8 9.4 8.3 8.3 7.8 6.3  NEUTROABS 7.1  --   --   --   --   --   HGB 9.9* 9.7* 9.5* 9.3* 9.7* 9.9*  HCT 31.3* 30.2* 29.8* 29.6* 31.7* 32.6*  MCV 94.0 93.2 92.3 93.7 95.2 95.9  PLT 295 258 252 245 264 789    Basic Metabolic Panel: Recent Labs  Lab 03/17/21 2155 03/18/21 0418 03/19/21 0357 03/20/21 0340 03/21/21 0409 03/23/21 0354  NA 139 140 140 140 139 137  K 4.5 4.3 4.4 4.9 5.1 5.2*  CL 115* 115* 114* 114* 112* 110  CO2 17* 17* 17* 16* 19* 18*  GLUCOSE 111* 103* 118* 84 97 82  BUN 57* 59* 66* 65* 69* 71*  CREATININE 4.87* 4.93* 4.80* 4.90* 4.81* 5.33*  CALCIUM 8.3* 8.1* 8.2* 8.3* 8.1* 8.4*  MG 2.3  --  2.5* 2.1 2.4 2.3  PHOS  --   --  5.3* 5.8*  --   --     Liver Function Tests: Recent Labs  Lab 03/17/21 2155 03/19/21 0357 03/20/21 0340  AST 15 13* 14*  ALT 10 8 8   ALKPHOS 75 61 64  BILITOT 0.6 0.5 0.5  PROT 6.6 6.1* 6.0*  ALBUMIN 2.5* 2.3*  2.2*    No results for input(s): LIPASE, AMYLASE in the last 168 hours. No results for input(s): AMMONIA in the last 168 hours. Cardiac Enzymes: Recent Labs  Lab 03/18/21 0418  CKTOTAL 44*    BNP (last 3 results) Recent Labs    03/07/21 1714 03/08/21 0509 03/17/21 2155  BNP 931.2* 915.5* 280.9*     ProBNP (last 3 results) No results for input(s): PROBNP in the last 8760 hours.  CBG: No results for input(s): GLUCAP in the last 168 hours. Recent Results (from the past 240 hour(s))  Resp Panel by RT-PCR (Flu A&B, Covid) Nasopharyngeal Swab     Status: None   Collection Time: 03/17/21 10:08 PM   Specimen: Nasopharyngeal Swab; Nasopharyngeal(NP) swabs in vial transport medium  Result Value Ref Range Status   SARS  Coronavirus 2 by RT PCR NEGATIVE NEGATIVE Final    Comment: (NOTE) SARS-CoV-2 target nucleic acids are NOT DETECTED.  The SARS-CoV-2 RNA is generally detectable in upper respiratory specimens during the acute phase of infection. The lowest concentration of SARS-CoV-2 viral copies this assay can detect is 138 copies/mL. A negative result does not preclude SARS-Cov-2 infection and should not be used as the sole basis for treatment or other patient management decisions. A negative result may occur with  improper specimen collection/handling, submission of specimen other than nasopharyngeal swab, presence of viral mutation(s) within the areas targeted by this assay, and inadequate number of viral copies(<138 copies/mL). A negative result must be combined with clinical observations, patient history, and epidemiological information. The expected result is Negative.  Fact Sheet for Patients:  EntrepreneurPulse.com.au  Fact Sheet for Healthcare Providers:  IncredibleEmployment.be  This test is no t yet approved or cleared by the [place] FDA and  has been authorized for detection and/or diagnosis of SARS-CoV-2 by FDA under  an Emergency Use Authorization (EUA). This EUA will remain  in effect (meaning this test can be used) for the duration of the COVID-19 declaration under Section 564(b)(1) of the Act, 21 U.S.C.section 360bbb-3(b)(1), unless the authorization is terminated  or revoked sooner.       Influenza A by PCR NEGATIVE NEGATIVE Final   Influenza B by PCR NEGATIVE NEGATIVE Final    Comment: (NOTE) The Xpert Xpress SARS-CoV-2/FLU/RSV plus assay is intended as an aid in the diagnosis of influenza from Nasopharyngeal swab specimens and should not be used as a sole basis for treatment. Nasal washings and aspirates are unacceptable for Xpert Xpress SARS-CoV-2/FLU/RSV testing.  Fact Sheet for Patients: EntrepreneurPulse.com.au  Fact Sheet for Healthcare Providers: IncredibleEmployment.be  This test is not yet approved or cleared by the [place] FDA and has been authorized for detection and/or diagnosis of SARS-CoV-2 by FDA under an Emergency Use Authorization (EUA). This EUA will remain in effect (meaning this test can be used) for the duration of the COVID-19 declaration under Section 564(b)(1) of the Act, 21 U.S.C. section 360bbb-3(b)(1), unless the authorization is terminated or revoked.  Performed at [facility], [place] [address], [place], [place] [address]       Studies: No results found.    Flora Lipps, MD  [facility] 03/23/2021  If 7PM-7AM, please contact night-coverage

## 2021-03-23 NOTE — Progress Notes (Signed)
[facility] Progress Note  Summary: 87 y.o. year-old w/ hx of COPD, CAD hx stenting, sp CVA, HTN, CKD 3b who presented from home after recent admit for LE cellulitis.  Pt c/o unable to walk, gen'd weakness in LE's, abd distension. In ED pt had worsening LE edema and erythema w/ weeping wounds. Creat had worsened to 4.8 from b/l creat 1.9- 2.5.  Pt was admitted. Unna boots were added to the lower legs bilat.  Rocephin was started for cellulitis. Asked to see for renal failure and volume overload.   Subjective: seen in room, 1.3 L UOP yest, wt's down 97kg   Vitals:   03/22/21 1200 03/22/21 2041 03/23/21 0418 03/23/21 0545  BP: (!) 154/70 (!) 160/74 (!) 165/65   Pulse: 73 78 80   Resp: _0 Temp: 98.1 F (36.7 C) 98.3 F (36.8 C) 98.3 F (36.8 C)   TempSrc: Oral Oral Oral   SpO2: 94% 93% 92%   Weight:    97 kg  Height:        Exam: Gen alert, no distress No jvd or bruits Chest occ rales at bases, no wheezing, nasal O2 Bilat lower legs wrapped, hips 2+ edema UE's 2+ bilat edema RRR no MRG Abd soft ntnd no mass or ascites +bs GU normal male w/ condom cath Neuro is alert, Ox 3 , nf     Home meds include - norvasc, asa, catapres, plavix, lasix 40 bid, norco prn, combivent, synthroid, neurontin, lopressor 25 qd, pravachol, sl ntg, prns/ vits / supps       Date                         Creat               eGFR    Jan -June 2022        1.81- 2.02 Jan 2021                  1.98                 33 ml/min, stage IIIb       Jan 10- 16, 2023      1.86- 2.93            Jan 23                      4.87   Jan 24                       4.93    Mar 19, 2020            4.80                        Alb 2.3  Co2 17  BUN 66  creat 4.80       BNP 281      Last CXR 1/15 = COPD      Renal US 1/24 - 11.4 and 9.6 cm kidneys w/o hydronephrosis. Mod atrophy and ^'d echotexture bilaterally.      ECHO 03/08/20 - LVEF 65- 70%, G1DD       UNa 29, UCr 150        UA 21-50 rbcs/ 0-5  wbcs, prot > 300        UP/C ratio from 03/06/21 prior admit = 381/67 = 5.64  Assessment/ Plan: AKI on CKD 3b - b/l creat from Nov 2022 1.98, eGFR 33 ml/min.  Here w/ AKI creat 4.8 in setting of weeping edema of LE's and UE's, volume overload, LE cellulitis possibly. Renal US shows chronic changes, no obstruction. UA w/ rbc's, high protein. Recent UP/C ratio high at 5 gms. AKI likely due to decompensated CHF/ vol overload. CKD could be glomerular disease and/or HTN'sive nephrosclerosis. No hx DM. Foley placed for urinary retention. Sent serologies for proteinuria, for prognostication.  Don't think he would be candidate for immunosuppression though. Concerned he may progress to dialysis. He is elderly but was taking care of himself prior to admission and said he'd want to "do everything". Wt's coming down today, 1st time. 1.3 L UOP yest.  Creat up 5.3  > reducing lasix to IV 120 bid, will dc zaroxolyn. No signs of uremia.   Will follow.  HTN - okay to cont BP meds but keep SBP > 110 in setting of acute diuresis COPD H/o CVA H/o CAD sp stent Anemia - Hb 9's, follow          Wayne Meadows 03/23/2021, 6:05 AM   Recent Labs  Lab 03/19/21 0357 03/20/21 0340 03/21/21 0409 03/23/21 0354  K 4.4 4.9 5.1 5.2*  BUN 66* 65* 69* 71*  CREATININE 4.80* 4.90* 4.81* 5.33*  CALCIUM 8.2* 8.3* 8.1* 8.4*  PHOS 5.3* 5.8*  --   --   HGB 9.5* 9.3* 9.7* 9.9*    Inpatient medications:  amLODipine  10 mg Oral Daily   aspirin EC  81 mg Oral Daily   Chlorhexidine Gluconate Cloth  6 each Topical Daily   cloNIDine  0.1 mg Oral Daily   clopidogrel  75 mg Oral Daily   docusate sodium  100 mg Oral BID   ferrous sulfate  325 mg Oral BID WC   heparin  5,000 Units Subcutaneous Q8H   hydrocerin   Topical Once per day on Mon Thu   levothyroxine  50 mcg Oral Q0600   lidocaine  1 patch Transdermal Q24H   metolazone  10 mg Oral Daily   metoprolol tartrate  25 mg Oral Daily   polyethylene glycol  17 g Oral  Daily   pravastatin  40 mg Oral QHS    furosemide 160 mg (03/23/21 0539)   acetaminophen **OR** acetaminophen, albuterol, hydrALAZINE, HYDROcodone-acetaminophen, ondansetron (ZOFRAN) IV, simethicone

## 2021-03-24 DIAGNOSIS — I4891 Unspecified atrial fibrillation: Secondary | ICD-10-CM | POA: Diagnosis not present

## 2021-03-24 DIAGNOSIS — J432 Centrilobular emphysema: Secondary | ICD-10-CM | POA: Diagnosis not present

## 2021-03-24 DIAGNOSIS — J449 Chronic obstructive pulmonary disease, unspecified: Secondary | ICD-10-CM | POA: Diagnosis not present

## 2021-03-24 DIAGNOSIS — N179 Acute kidney failure, unspecified: Secondary | ICD-10-CM | POA: Diagnosis not present

## 2021-03-24 LAB — COMPREHENSIVE METABOLIC PANEL
ALT: 8 U/L (ref 0–44)
AST: 12 U/L — ABNORMAL LOW (ref 15–41)
Albumin: 2.2 g/dL — ABNORMAL LOW (ref 3.5–5.0)
Alkaline Phosphatase: 67 U/L (ref 38–126)
Anion gap: 10 (ref 5–15)
BUN: 74 mg/dL — ABNORMAL HIGH (ref 8–23)
CO2: 19 mmol/L — ABNORMAL LOW (ref 22–32)
Calcium: 8.5 mg/dL — ABNORMAL LOW (ref 8.9–10.3)
Chloride: 109 mmol/L (ref 98–111)
Creatinine, Ser: 5.31 mg/dL — ABNORMAL HIGH (ref 0.61–1.24)
GFR, Estimated: 10 mL/min — ABNORMAL LOW (ref >60)
Glucose, Bld: 86 mg/dL (ref 70–99)
Potassium: 5 mmol/L (ref 3.5–5.1)
Sodium: 138 mmol/L (ref 135–145)
Total Bilirubin: 0.5 mg/dL (ref 0.3–1.2)
Total Protein: 6.2 g/dL — ABNORMAL LOW (ref 6.5–8.1)

## 2021-03-24 LAB — CBC
HCT: 31.4 % — ABNORMAL LOW (ref 39.0–52.0)
Hemoglobin: 9.9 g/dL — ABNORMAL LOW (ref 13.0–17.0)
MCH: 29.6 pg (ref 26.0–34.0)
MCHC: 31.5 g/dL (ref 30.0–36.0)
MCV: 94 fL (ref 80.0–100.0)
Platelets: 248 10*3/uL (ref 150–400)
RBC: 3.34 MIL/uL — ABNORMAL LOW (ref 4.22–5.81)
RDW: 15.1 % (ref 11.5–15.5)
WBC: 7.1 10*3/uL (ref 4.0–10.5)
nRBC: 0 % (ref 0.0–0.2)

## 2021-03-24 LAB — PHOSPHORUS: Phosphorus: 7.7 mg/dL — ABNORMAL HIGH (ref 2.5–4.6)

## 2021-03-24 LAB — MAGNESIUM: Magnesium: 2.3 mg/dL (ref 1.7–2.4)

## 2021-03-24 MED ORDER — MELATONIN 3 MG PO TABS
3.0000 mg | ORAL_TABLET | Freq: Every day | ORAL | Status: DC
  Administered 2021-03-24 – 2021-04-03 (×11): 3 mg via ORAL
  Filled 2021-03-24 (×11): qty 1

## 2021-03-24 NOTE — Progress Notes (Signed)
PROGRESS NOTE  Wayne Meadows QMV:[id number] DOB: January 29, 1935 DOA: 03/17/2021 [facility]: [facility]   LOS: 6 days   Brief narrative:   Wayne Meadows is a 87 y.o. male with history of chronic kidney disease stage IIIb, CVA and CAD, diabetes mellitus, COPD, hypertension was recently admitted for cellulitis of the lower extremity, discharged on antibiotic presented to hospital with increasing weakness, difficulty ambulating with increasing redness and swelling of his lower extremities.  In the ED, patient was noted to have a weeping of his bilateral lower extremities with erythema.  Creatinine had worsened from 2.9 to 4.8 on presentation.   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  Nephrology was consulted during hospitalization for management of fluid overload and renal failure.  Assessment/Plan:  Principal Problem:   Acute renal failure (ARF) (HCC) Active Problems:   CAD (coronary artery disease)   COPD (chronic obstructive pulmonary disease) (HCC)   Atrial fibrillation (HCC)   Renal cell cancer (HCC)   Essential hypertension   Centrilobular emphysema (HCC)   AKI (acute kidney injury) ([place])   Hypothyroidism   ARF (acute renal failure) (HCC)   Acute on chronic kidney disease stage IV with non-anion gap metabolic acidosis- Urinalysis with proteinuria. Nephrology on board and is on IV Lasix 120 mg every 12 hours.  Patient also received Zaroxolyn during hospitalization.  Continue Foley catheter and strict intake and output charting.  Ultrasound of the kidneys with echogenic kidneys.  Follow nephrology recommendation.  Patient is negative balance for 3849 mL so far.  Gross bilateral lower extremity edema anasarca/venous insufficiency.  Patient did have a recent ABI which was normal.  Dopplers were negative for DVT at that time.  Continue Unna boots.  Currently on high-dose IV  diuretics as per nephrology..  CAD  History of coronary stent placement.  Continue Plavix statin and beta-blockers.  No acute issues at this time.  Denies chest pain.  Has remained stable.  Essential hypertension  Continue amlodipine clonidine and beta-blockers.  On as needed hydralazine and IV diuretic.   Hypothyroidism Continue Synthroid   COPD  Compensated at this time.  Continue bronchodilators   Chronic anemia likely from renal disease, follow CBC.  Hemoglobin of 9.9.  Debility, deconditioning. Patient lives by himself at home and has a walker and cane at home.  Physical therapy has recommended skilled nursing facility on discharge.  DVT prophylaxis: heparin injection 5,000 Units Start: 03/18/21 0600   Code Status: Full code  Family Communication:  I spoke with the patient's daughter on the phone on 03/23/2021   Status is: Inpatient   Remains inpatient appropriate because: Worsening CKD, fluid overload, IV diuretics  Consultants: Nephrology  Procedures: None  Anti-infectives:  Rocephin IV 1/24>1/26  Anti-infectives (From admission, onward)    Start     Dose/Rate Route Frequency Ordered Stop   03/18/21 0200  cefTRIAXone (ROCEPHIN) 2 g in sodium chloride 0.9 % 100 mL IVPB  Status:  Discontinued        2 g 200 mL/hr over 30 Minutes Intravenous Every 24 hours 03/18/21 0158 03/20/21 1542      Subjective:  Today, patient was seen and examined at bedside.  Denies any nausea vomiting abdominal pain.  Denies increasing shortness of breath.  Has weakness.   Vitals:   03/24/21 0553 03/24/21 0813  BP: (!) 165/76 (!) 182/71  Pulse: 92 87  Resp: (!) 21  Temp: 98.3 F (36.8 C)   SpO2: 91% 92%    Intake/Output Summary (Last 24 hours) at 03/24/2021 0953 Last data filed at 03/24/2021 0500 Gross per 24 hour  Intake 882.58 ml  Output 1200 ml  Net -317.42 ml    Filed Weights   03/22/21 0800 03/23/21 0545 03/24/21 0500  Weight: 99.3 kg 97 kg 96.5 kg   Body  mass index is 27.31 kg/m.   Physical Exam:  General:  Average built, not in obvious distress, on nasal cannula oxygen HENT:   No scleral pallor or icterus noted. Oral mucosa is moist.  Chest: .  Diminished breath sounds bilaterally.  Coarse breath sounds noted. CVS: S1 &S2 heard. No murmur.  Regular rate and rhythm. Abdomen: Soft, nontender, nondistended.  Bowel sounds are heard.   Extremities: No cyanosis, clubbing bilateral lower extremities with Unna boot.  Peripheral edema present. Psych: Alert, awake and oriented, normal mood CNS:  No cranial nerve deficits.  Power equal in all extremities.   Skin: Warm and dry.  No rashes noted.   Data Review: I have personally reviewed the following laboratory data and studies,  CBC: Recent Labs  Lab 03/17/21 2155 03/18/21 0418 03/19/21 0357 03/20/21 0340 03/21/21 0409 03/23/21 0354 03/24/21 0354  WBC 9.8   < > 8.3 8.3 7.8 6.3 7.1  NEUTROABS 7.1  --   --   --   --   --   --   HGB 9.9*   < > 9.5* 9.3* 9.7* 9.9* 9.9*  HCT 31.3*   < > 29.8* 29.6* 31.7* 32.6* 31.4*  MCV 94.0   < > 92.3 93.7 95.2 95.9 94.0  PLT 295   < > 252 245 264 238 248   < > = values in this interval not displayed.    Basic Metabolic Panel: Recent Labs  Lab 03/19/21 0357 03/20/21 0340 03/21/21 0409 03/23/21 0354 03/24/21 0354  NA 140 140 139 137 138  K 4.4 4.9 5.1 5.2* 5.0  CL 114* 114* 112* 110 109  CO2 17* 16* 19* 18* 19*  GLUCOSE 118* 84 97 82 86  BUN 66* 65* 69* 71* 74*  CREATININE 4.80* 4.90* 4.81* 5.33* 5.31*  CALCIUM 8.2* 8.3* 8.1* 8.4* 8.5*  MG 2.5* 2.1 2.4 2.3 2.3  PHOS 5.3* 5.8*  --   --  7.7*    Liver Function Tests: Recent Labs  Lab 03/17/21 2155 03/19/21 0357 03/20/21 0340 03/24/21 0354  AST 15 13* 14* 12*  ALT 10 8 8 8   ALKPHOS 75 61 64 67  BILITOT 0.6 0.5 0.5 0.5  PROT 6.6 6.1* 6.0* 6.2*  ALBUMIN 2.5* 2.3* 2.2* 2.2*    No results for input(s): LIPASE, AMYLASE in the last 168 hours. No results for input(s): AMMONIA in the  last 168 hours. Cardiac Enzymes: Recent Labs  Lab 03/18/21 0418  CKTOTAL 44*    BNP (last 3 results) Recent Labs    03/07/21 1714 03/08/21 0509 03/17/21 2155  BNP 931.2* 915.5* 280.9*     ProBNP (last 3 results) No results for input(s): PROBNP in the last 8760 hours.  CBG: No results for input(s): GLUCAP in the last 168 hours. Recent Results (from the past 240 hour(s))  Resp Panel by RT-PCR (Flu A&B, Covid) Nasopharyngeal Swab     Status: None   Collection Time: 03/17/21 10:08 PM   Specimen: Nasopharyngeal Swab; Nasopharyngeal(NP) swabs in vial transport medium  Result Value Ref Range Status   SARS Coronavirus 2 by RT PCR NEGATIVE  NEGATIVE Final    Comment: (NOTE) SARS-CoV-2 target nucleic acids are NOT DETECTED.  The SARS-CoV-2 RNA is generally detectable in upper respiratory specimens during the acute phase of infection. The lowest concentration of SARS-CoV-2 viral copies this assay can detect is 138 copies/mL. A negative result does not preclude SARS-Cov-2 infection and should not be used as the sole basis for treatment or other patient management decisions. A negative result may occur with  improper specimen collection/handling, submission of specimen other than nasopharyngeal swab, presence of viral mutation(s) within the areas targeted by this assay, and inadequate number of viral copies(<138 copies/mL). A negative result must be combined with clinical observations, patient history, and epidemiological information. The expected result is Negative.  Fact Sheet for Patients:  EntrepreneurPulse.com.au  Fact Sheet for Healthcare Providers:  IncredibleEmployment.be  This test is no t yet approved or cleared by the [place] FDA and  has been authorized for detection and/or diagnosis of SARS-CoV-2 by FDA under an Emergency Use Authorization (EUA). This EUA will remain  in effect (meaning this test can be used) for the  duration of the COVID-19 declaration under Section 564(b)(1) of the Act, 21 U.S.C.section 360bbb-3(b)(1), unless the authorization is terminated  or revoked sooner.       Influenza A by PCR NEGATIVE NEGATIVE Final   Influenza B by PCR NEGATIVE NEGATIVE Final    Comment: (NOTE) The Xpert Xpress SARS-CoV-2/FLU/RSV plus assay is intended as an aid in the diagnosis of influenza from Nasopharyngeal swab specimens and should not be used as a sole basis for treatment. Nasal washings and aspirates are unacceptable for Xpert Xpress SARS-CoV-2/FLU/RSV testing.  Fact Sheet for Patients: EntrepreneurPulse.com.au  Fact Sheet for Healthcare Providers: IncredibleEmployment.be  This test is not yet approved or cleared by the [place] FDA and has been authorized for detection and/or diagnosis of SARS-CoV-2 by FDA under an Emergency Use Authorization (EUA). This EUA will remain in effect (meaning this test can be used) for the duration of the COVID-19 declaration under Section 564(b)(1) of the Act, 21 U.S.C. section 360bbb-3(b)(1), unless the authorization is terminated or revoked.  Performed at [facility], [place] [address], [place], [place] [address]       Studies: No results found.    Flora Lipps, MD  [facility] 03/24/2021  If 7PM-7AM, please contact night-coverage

## 2021-03-24 NOTE — Progress Notes (Signed)
Orthopedic Tech Progress Note Patient Details:  Wayne Meadows [birth year] [id number]  Ortho Devices Type of Ortho Device: Louretta Parma boot Ortho Device/Splint Location: Bilateral unna boots Ortho Device/Splint Interventions: Application   Post Interventions Patient Tolerated: Well Instructions Provided: Care of device  [place] Pink 03/24/2021, 3:46 PM

## 2021-03-24 NOTE — Progress Notes (Signed)
Patient ID: Wayne Meadows, male   DOB: 1934/10/06, 87 y.o.   MRN: [id number] [facility] Progress Note   Assessment/ Plan:   1. Acute kidney Injury on chronic kidney disease stage IIIb: Baseline creatinine around 2.0 and admission data/labs indicative of acute kidney injury likely due to CHF decompensation (decreased effective arterial blood volume).  Renal function unchanged overnight with unimpressive urine output following decreased diuretic dose.  He does not appear to have any acute indications for dialysis at this time and I will continue to monitor him on the current dose of diuresis. 2.  Hypertension: Blood pressures are intermittently elevated while on clonidine, amlodipine and metoprolol.  Continue to monitor cautiously with ongoing diuresis to limit hemodynamic fluctuations. 3.  Anemia: Without overt blood loss and likely secondary to chronic illness 4.  Non-anion gap metabolic acidosis: Secondary to progressive chronic kidney disease with element of acute kidney injury, will monitor with ongoing diuresis/potential renal recovery.  Subjective:   Reports to be feeling better with a sensation that he is losing a lot of fluid.  He is only net -0.5 L overnight with a corresponding weight loss.   Objective:   BP (!) 171/70 (BP Location: Left Arm)    Pulse 74    Temp 97.7 F (36.5 C) (Oral)    Resp 20    Ht 6\' 2"  (1.88 m)    Wt 96.5 kg    SpO2 93%    BMI 27.31 kg/m   Intake/Output Summary (Last 24 hours) at 03/24/2021 1505 Last data filed at 03/24/2021 0500 Gross per 24 hour  Intake 642.58 ml  Output 1200 ml  Net -557.42 ml   Weight change: -2.8 kg  Physical Exam: Gen: Appears comfortable resting in bed, conversant with prompting CVS: Pulse regular rhythm, normal rate, S1 and S2 normal Resp: Diminished breath sounds over bases with fine rales left side Abd: Soft, obese, nontender Ext: 1-2+ bilateral lower extremity/thigh edema  Imaging: No results  found.  Labs: BMET Recent Labs  Lab 03/17/21 2155 03/18/21 0418 03/19/21 0357 03/20/21 0340 03/21/21 0409 03/23/21 0354 03/24/21 0354  NA 139 140 140 140 139 137 138  K 4.5 4.3 4.4 4.9 5.1 5.2* 5.0  CL 115* 115* 114* 114* 112* 110 109  CO2 17* 17* 17* 16* 19* 18* 19*  GLUCOSE 111* 103* 118* 84 97 82 86  BUN 57* 59* 66* 65* 69* 71* 74*  CREATININE 4.87* 4.93* 4.80* 4.90* 4.81* 5.33* 5.31*  CALCIUM 8.3* 8.1* 8.2* 8.3* 8.1* 8.4* 8.5*  PHOS  --   --  5.3* 5.8*  --   --  7.7*   CBC Recent Labs  Lab 03/17/21 2155 03/18/21 0418 03/20/21 0340 03/21/21 0409 03/23/21 0354 03/24/21 0354  WBC 9.8   < > 8.3 7.8 6.3 7.1  NEUTROABS 7.1  --   --   --   --   --   HGB 9.9*   < > 9.3* 9.7* 9.9* 9.9*  HCT 31.3*   < > 29.6* 31.7* 32.6* 31.4*  MCV 94.0   < > 93.7 95.2 95.9 94.0  PLT 295   < > 245 264 238 248   < > = values in this interval not displayed.    Medications:     amLODipine  10 mg Oral Daily   aspirin EC  81 mg Oral Daily   Chlorhexidine Gluconate Cloth  6 each Topical Daily   cloNIDine  0.1 mg Oral Daily   clopidogrel  75 mg  Oral Daily   docusate sodium  100 mg Oral BID   ferrous sulfate  325 mg Oral BID WC   heparin  5,000 Units Subcutaneous Q8H   hydrocerin   Topical Once per day on Mon Thu   levothyroxine  50 mcg Oral Q0600   lidocaine  1 patch Transdermal Q24H   metoprolol tartrate  25 mg Oral Daily   polyethylene glycol  17 g Oral Daily   pravastatin  40 mg Oral QHS   Elmarie Shiley, MD 03/24/2021, 3:05 PM

## 2021-03-24 NOTE — Progress Notes (Signed)
Physical Therapy Treatment Patient Details Name: Wayne Meadows MRN: [id number] DOB: August 11, 1934 Today's Date: 03/24/2021   History of Present Illness 87 y.o. male recently admitted for cellulitis of the lower extremity discharged on 1/17 on antibiotics. Pt presented to hospital with increasing weakness, difficulty ambulating with increasing redness and swelling of his lower extremities.  In the ED, patient was noted to have a weeping of his bilateral lower extremities with erythema.  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  PMH significant for HTN, CAD with stents, history of CVA, COPD (not on home O2), arthritis, tobacco abuse in remission presented in the ED with complaints of pain, swelling and redness of both lower extremities. Dx of recurrent cellulitis and ARF.    PT Comments    The  patient required encouragement to  participate, but did mobilize. Wayne Meadows lift equipment used today for safe transfers with 1 person assisting.  Patient will  benefit from SNF rehab.  Patient resting on 3 L at 90%, 90 HR, After transfer 81 %. Slow rise back to 88% on 3 L [name].  Recommendations for follow up therapy are one component of a multi-disciplinary discharge planning process, led by the attending physician.  Recommendations may be updated based on patient status, additional functional criteria and insurance authorization.  Follow Up Recommendations  Skilled nursing-short term rehab (<3 hours/day)     Assistance Recommended at Discharge Frequent or constant Supervision/Assistance  Patient can return home with the following Assistance with cooking/housework;Assist for transportation;Help with stairs or ramp for entrance;Two people to help with walking and/or transfers;A lot of help with bathing/dressing/bathroom   Equipment Recommendations  None recommended by PT    Recommendations for  Other Services       Precautions / Restrictions Precautions Precautions: Fall Precaution Comments: monitor sats     Mobility  Bed Mobility   Bed Mobility: Supine to Sit     Supine to sit: Min assist, HOB elevated     General bed mobility comments: extra time to rise ansd scoot to bed edge.    Transfers Overall transfer level: Needs assistance   Transfers: Sit to/from Stand, Bed to chair/wheelchair/BSC Sit to Stand: Mod assist           General transfer comment: patient reported that he and RN "fell" to bed yesterday, got tangled in chair. Wayne Meadows used today with having only 1 person to assist. Patient stood at [facility] x ~ 2 minutes and was steady. He  did not sit onto the paltforms while turning to recliner. Stood againe with minguard, sat down into recliner with minguard and slow dexcent. Transfer via Lift Equipment: Stedy  Ambulation/Gait                   Stairs             Wheelchair Mobility    Modified Rankin (Stroke Patients Only)       Balance Overall balance assessment: Needs assistance Sitting-balance support: Feet supported Sitting balance-Leahy Scale: Fair     Standing balance support: Reliant on assistive device for balance, During functional activity, Bilateral upper extremity supported Standing balance-Leahy Scale: Poor                              Cognition Arousal/Alertness: Awake/alert Behavior During Therapy: Flat affect  General Comments: AxO x month, not date. .        Exercises      General Comments        Pertinent Vitals/Pain Pain Assessment Faces Pain Scale: Hurts even more Pain Location: back pain Pain Descriptors / Indicators: Discomfort, Grimacing Pain Intervention(s): Patient requesting pain meds-RN notified, Monitored during session    Home Living                          Prior Function            PT Goals (current  goals can now be found in the care plan section) Acute Rehab PT Goals Potential to Achieve Goals: Good Progress towards PT goals: Progressing toward goals    Frequency    Min 2X/week      PT Plan Current plan remains appropriate    Co-evaluation              AM-PAC PT "6 Clicks" Mobility   Outcome Measure  Help needed turning from your back to your side while in a flat bed without using bedrails?: A Little Help needed moving from lying on your back to sitting on the side of a flat bed without using bedrails?: A Little Help needed moving to and from a bed to a chair (including a wheelchair)?: A Lot Help needed standing up from a chair using your arms (e.g., wheelchair or bedside chair)?: A Lot Help needed to walk in hospital room?: Total Help needed climbing 3-5 steps with a railing? : Total 6 Click Score: 12    End of Session Equipment Utilized During Treatment: Gait belt Activity Tolerance: Treatment limited secondary to medical complications (Comment);Patient limited by pain Patient left: in chair;with call bell/phone within reach;with chair alarm set Nurse Communication: Mobility status;Patient requests pain meds PT Visit Diagnosis: Difficulty in walking, not elsewhere classified (R26.2);Pain     Time: 0240-9735 PT Time Calculation (min) (ACUTE ONLY): 24 min  Charges:  $Therapeutic Activity: 23-37 mins                     Wayne Meadows PT Acute Rehabilitation Services Pager [phone] Office [phone]    Wayne Meadows 03/24/2021, 10:19 AM

## 2021-03-25 DIAGNOSIS — N179 Acute kidney failure, unspecified: Secondary | ICD-10-CM | POA: Diagnosis not present

## 2021-03-25 DIAGNOSIS — I4891 Unspecified atrial fibrillation: Secondary | ICD-10-CM | POA: Diagnosis not present

## 2021-03-25 DIAGNOSIS — J449 Chronic obstructive pulmonary disease, unspecified: Secondary | ICD-10-CM | POA: Diagnosis not present

## 2021-03-25 DIAGNOSIS — J432 Centrilobular emphysema: Secondary | ICD-10-CM | POA: Diagnosis not present

## 2021-03-25 LAB — RENAL FUNCTION PANEL
Albumin: 2.2 g/dL — ABNORMAL LOW (ref 3.5–5.0)
Anion gap: 10 (ref 5–15)
BUN: 74 mg/dL — ABNORMAL HIGH (ref 8–23)
CO2: 17 mmol/L — ABNORMAL LOW (ref 22–32)
Calcium: 8.3 mg/dL — ABNORMAL LOW (ref 8.9–10.3)
Chloride: 108 mmol/L (ref 98–111)
Creatinine, Ser: 5.55 mg/dL — ABNORMAL HIGH (ref 0.61–1.24)
GFR, Estimated: 9 mL/min — ABNORMAL LOW (ref >60)
Glucose, Bld: 77 mg/dL (ref 70–99)
Phosphorus: 7.1 mg/dL — ABNORMAL HIGH (ref 2.5–4.6)
Potassium: 5.1 mmol/L (ref 3.5–5.1)
Sodium: 135 mmol/L (ref 135–145)

## 2021-03-25 LAB — IRON AND TIBC
Iron: 50 ug/dL (ref 45–182)
Saturation Ratios: 29 % (ref 17.9–39.5)
TIBC: 175 ug/dL — ABNORMAL LOW (ref 250–450)
UIBC: 125 ug/dL

## 2021-03-25 LAB — ANCA PROFILE
Anti-MPO Antibodies: <0.2 units (ref 0.0–0.9)
Anti-PR3 Antibodies: <0.2 units (ref 0.0–0.9)
Atypical P-ANCA titer: <1:20 {titer}
C-ANCA: <1:20 {titer}
P-ANCA: <1:20 {titer}

## 2021-03-25 LAB — CBC
HCT: 32.2 % — ABNORMAL LOW (ref 39.0–52.0)
Hemoglobin: 10.2 g/dL — ABNORMAL LOW (ref 13.0–17.0)
MCH: 29.5 pg (ref 26.0–34.0)
MCHC: 31.7 g/dL (ref 30.0–36.0)
MCV: 93.1 fL (ref 80.0–100.0)
Platelets: 251 10*3/uL (ref 150–400)
RBC: 3.46 MIL/uL — ABNORMAL LOW (ref 4.22–5.81)
RDW: 15.1 % (ref 11.5–15.5)
WBC: 6.5 10*3/uL (ref 4.0–10.5)
nRBC: 0 % (ref 0.0–0.2)

## 2021-03-25 LAB — FERRITIN: Ferritin: 101 ng/mL (ref 24–336)

## 2021-03-25 LAB — MAGNESIUM: Magnesium: 2.3 mg/dL (ref 1.7–2.4)

## 2021-03-25 MED ORDER — FUROSEMIDE 10 MG/ML IJ SOLN
80.0000 mg | Freq: Once | INTRAMUSCULAR | Status: AC
  Administered 2021-03-26: 80 mg via INTRAVENOUS
  Filled 2021-03-25: qty 8

## 2021-03-25 MED ORDER — ALBUMIN HUMAN 25 % IV SOLN
12.5000 g | Freq: Once | INTRAVENOUS | Status: AC
  Administered 2021-03-25: 12.5 g via INTRAVENOUS
  Filled 2021-03-25: qty 50

## 2021-03-25 MED ORDER — ALBUMIN HUMAN 25 % IV SOLN
12.5000 g | Freq: Once | INTRAVENOUS | Status: AC
  Administered 2021-03-26: 12.5 g via INTRAVENOUS
  Filled 2021-03-25: qty 50

## 2021-03-25 MED ORDER — FUROSEMIDE 10 MG/ML IJ SOLN
80.0000 mg | Freq: Once | INTRAMUSCULAR | Status: AC
  Administered 2021-03-25: 80 mg via INTRAVENOUS
  Filled 2021-03-25: qty 8

## 2021-03-25 NOTE — Progress Notes (Signed)
Patient ID: Wayne Meadows, male   DOB: 1934-08-20, 87 y.o.   MRN: [id number] [facility] Progress Note   Assessment/ Plan:   1. Acute kidney Injury on chronic kidney disease stage IIIb: Baseline creatinine around 2.0 and admission data/labs indicative of acute kidney injury likely due to CHF decompensation (decreased effective arterial blood volume).  Urine output appears to be essentially fixed and rather unresponsive for the current dose of diuretics-we will try switching to IV furosemide 80 mg 3 times daily with albumin to see if we can improve pharmacokinetics.  I had a brief discussion with him regarding the difficulty of managing cardiorenal syndrome and the likelihood that he may end up on dialysis-he understands this. 2.  Hypertension: Blood pressures are intermittently elevated while on clonidine, amlodipine and metoprolol.  Continue to monitor cautiously with ongoing diuresis to limit hemodynamic fluctuations. 3.  Anemia: Without overt blood loss and likely secondary to chronic illness 4.  Non-anion gap metabolic acidosis: Secondary to progressive chronic kidney disease with element of acute kidney injury, will monitor with ongoing diuresis/potential renal recovery.  Subjective:   Reports that he feels better and is waiting for help to get out of bed into the chair.   Objective:   BP (!) 146/61 (BP Location: Left Arm)    Pulse 72    Temp 98.7 F (37.1 C) (Oral)    Resp 16    Ht 6\' 2"  (1.88 m)    Wt 93.7 kg    SpO2 94%    BMI 26.52 kg/m   Intake/Output Summary (Last 24 hours) at 03/25/2021 1310 Last data filed at 03/25/2021 1226 Gross per 24 hour  Intake 480 ml  Output 1800 ml  Net -1320 ml   Weight change: -2.8 kg  Physical Exam: Gen: Appears comfortable resting in bed, watching television and able to answer questions without problems. CVS: Pulse regular rhythm, normal rate, S1 and S2 normal Resp: Diminished breath sounds over bases with fine rales left  side Abd: Soft, obese, nontender Ext: 1-2+ over thighs/lower back, trace edema over legs with Unna boots  Imaging: No results found.  Labs: BMET Recent Labs  Lab 03/19/21 0357 03/20/21 0340 03/21/21 0409 03/23/21 0354 03/24/21 0354 03/25/21 0347  NA 140 140 139 137 138 135  K 4.4 4.9 5.1 5.2* 5.0 5.1  CL 114* 114* 112* 110 109 108  CO2 17* 16* 19* 18* 19* 17*  GLUCOSE 118* 84 97 82 86 77  BUN 66* 65* 69* 71* 74* 74*  CREATININE 4.80* 4.90* 4.81* 5.33* 5.31* 5.55*  CALCIUM 8.2* 8.3* 8.1* 8.4* 8.5* 8.3*  PHOS 5.3* 5.8*  --   --  7.7* 7.1*   CBC Recent Labs  Lab 03/21/21 0409 03/23/21 0354 03/24/21 0354 03/25/21 0347  WBC 7.8 6.3 7.1 6.5  HGB 9.7* 9.9* 9.9* 10.2*  HCT 31.7* 32.6* 31.4* 32.2*  MCV 95.2 95.9 94.0 93.1  PLT 264 238 248 251    Medications:     amLODipine  10 mg Oral Daily   aspirin EC  81 mg Oral Daily   Chlorhexidine Gluconate Cloth  6 each Topical Daily   cloNIDine  0.1 mg Oral Daily   clopidogrel  75 mg Oral Daily   docusate sodium  100 mg Oral BID   ferrous sulfate  325 mg Oral BID WC   heparin  5,000 Units Subcutaneous Q8H   hydrocerin   Topical Once per day on Mon Thu   levothyroxine  50 mcg Oral Q0600  lidocaine  1 patch Transdermal Q24H   melatonin  3 mg Oral QHS   metoprolol tartrate  25 mg Oral Daily   polyethylene glycol  17 g Oral Daily   pravastatin  40 mg Oral QHS   Elmarie Shiley, MD 03/25/2021, 1:10 PM

## 2021-03-25 NOTE — Progress Notes (Signed)
PROGRESS NOTE  JARICK HARKINS DGL:[id number] DOB: 1934-03-21 DOA: 03/17/2021 PCP: [facility]   LOS: 7 days   Brief narrative:   Wayne Meadows is a 87 y.o. male with history of chronic kidney disease stage IIIb, CVA and CAD, diabetes mellitus, COPD, hypertension was recently admitted for cellulitis of the lower extremity, discharged on antibiotic presented to hospital with increasing weakness, difficulty ambulating with increasing redness and swelling of his lower extremities.  In the ED, patient was noted to have a weeping of his bilateral lower extremities with erythema.  Creatinine had worsened from 2.9 to 4.8 on presentation.   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  Nephrology was consulted during hospitalization for management of fluid overload and renal failure.  Assessment/Plan:  Principal Problem:   Acute renal failure (ARF) (HCC) Active Problems:   CAD (coronary artery disease)   COPD (chronic obstructive pulmonary disease) (HCC)   Atrial fibrillation (HCC)   Renal cell cancer (HCC)   Essential hypertension   Centrilobular emphysema (HCC)   AKI (acute kidney injury) ([place])   Hypothyroidism   ARF (acute renal failure) (HCC)   Acute on chronic kidney disease stage IV with non-anion gap metabolic acidosis- Urinalysis with proteinuria. Nephrology on board and is on IV Lasix 120 mg every 12 hours.  Patient also received Zaroxolyn during hospitalization.  Continue Foley catheter and strict intake and output charting.  Ultrasound of the kidneys with echogenic kidneys.  Follow nephrology recommendation.  Patient is negative balance for 4069 mL so far so far.  Still without uremic symptoms.  creatinine with a slow rising up trend..  Creatinine of 5.5 today.  Gross bilateral lower extremity edema anasarca/venous insufficiency.  Patient did have a recent ABI which  was normal.  Dopplers were negative for DVT at that time.  Continue Unna boots.  Currently on  IV diuretics as per nephrology..  CAD  History of coronary stent placement.  Continue Plavix statin and beta-blockers.  No acute issues at this time.  Denies chest pain.  Has remained stable.  Essential hypertension  Continue amlodipine, clonidine and metoprolol on as needed hydralazine and IV diuretic.   Hypothyroidism Continue Synthroid   COPD  Compensated at this time.  Continue bronchodilators   Chronic anemia likely from renal disease, follow CBC.  Hemoglobin of 10.2 at this time  Debility, deconditioning. Patient lives by himself at home and has a walker and cane at home.  Physical therapy has recommended skilled nursing facility on discharge.  Disposition.  Likely to skilled nursing facility on discharge pending clinical improvement.  DVT prophylaxis: heparin injection 5,000 Units Start: 03/18/21 0600   Code Status: Full code  Family Communication:  I spoke with the patient's daughter on the phone on 03/23/2021   Status is: Inpatient   Remains inpatient appropriate because: Worsening CKD, fluid overload, IV diuretics  Consultants: Nephrology  Procedures: None  Anti-infectives:  Rocephin IV 1/24>1/26  Anti-infectives (From admission, onward)    Start     Dose/Rate Route Frequency Ordered Stop   03/18/21 0200  cefTRIAXone (ROCEPHIN) 2 g in sodium chloride 0.9 % 100 mL IVPB  Status:  Discontinued        2 g 200 mL/hr over 30 Minutes Intravenous Every 24 hours 03/18/21 0158 03/20/21 1542      Subjective:  Today, patient was seen and examined at bedside.  Patient denies any nausea vomiting  fever chills or rigor.  Has been eating okay.  Has had bowel movements.  Denies shortness of breath chest pain palpitation.   Vitals:   03/25/21 0503 03/25/21 0913  BP: (!) 153/59 (!) 165/63  Pulse: 81 88  Resp: 18   Temp: 98.7 F (37.1 C)   SpO2: 92% 94%    Intake/Output  Summary (Last 24 hours) at 03/25/2021 1035 Last data filed at 03/25/2021 0919 Gross per 24 hour  Intake 840 ml  Output 1300 ml  Net -460 ml    Filed Weights   03/23/21 0545 03/24/21 0500 03/25/21 0503  Weight: 97 kg 96.5 kg 93.7 kg   Body mass index is 26.52 kg/m.   Physical Exam:  General:  Average built, not in obvious distress, on 3 L of oxygen by nasal cannula. HENT:   No scleral pallor or icterus noted. Oral mucosa is moist.  Chest:    Diminished breath sounds bilaterally.  Coarse breath sounds noted. CVS: S1 &S2 heard. No murmur.  Regular rate and rhythm. Abdomen: Soft, nontender, nondistended.  Bowel sounds are heard.   Extremities: No cyanosis, clubbing with bilateral lower extremity edema on Unna boot..  Peripheral pulses are palpable. Psych: Alert, awake and oriented, normal mood CNS:  No cranial nerve deficits.  Moves all extremities Skin: Warm and dry.  No rashes noted.   Data Review: I have personally reviewed the following laboratory data and studies,  CBC: Recent Labs  Lab 03/20/21 0340 03/21/21 0409 03/23/21 0354 03/24/21 0354 03/25/21 0347  WBC 8.3 7.8 6.3 7.1 6.5  HGB 9.3* 9.7* 9.9* 9.9* 10.2*  HCT 29.6* 31.7* 32.6* 31.4* 32.2*  MCV 93.7 95.2 95.9 94.0 93.1  PLT 245 264 238 248 202    Basic Metabolic Panel: Recent Labs  Lab 03/19/21 0357 03/20/21 0340 03/21/21 0409 03/23/21 0354 03/24/21 0354 03/25/21 0347  NA 140 140 139 137 138 135  K 4.4 4.9 5.1 5.2* 5.0 5.1  CL 114* 114* 112* 110 109 108  CO2 17* 16* 19* 18* 19* 17*  GLUCOSE 118* 84 97 82 86 77  BUN 66* 65* 69* 71* 74* 74*  CREATININE 4.80* 4.90* 4.81* 5.33* 5.31* 5.55*  CALCIUM 8.2* 8.3* 8.1* 8.4* 8.5* 8.3*  MG 2.5* 2.1 2.4 2.3 2.3 2.3  PHOS 5.3* 5.8*  --   --  7.7* 7.1*    Liver Function Tests: Recent Labs  Lab 03/19/21 0357 03/20/21 0340 03/24/21 0354 03/25/21 0347  AST 13* 14* 12*  --   ALT 8 8 8   --   ALKPHOS 61 64 67  --   BILITOT 0.5 0.5 0.5  --   PROT 6.1* 6.0*  6.2*  --   ALBUMIN 2.3* 2.2* 2.2* 2.2*    No results for input(s): LIPASE, AMYLASE in the last 168 hours. No results for input(s): AMMONIA in the last 168 hours. Cardiac Enzymes: No results for input(s): CKTOTAL, CKMB, CKMBINDEX, TROPONINI in the last 168 hours.  BNP (last 3 results) Recent Labs    03/07/21 1714 03/08/21 0509 03/17/21 2155  BNP 931.2* 915.5* 280.9*     ProBNP (last 3 results) No results for input(s): PROBNP in the last 8760 hours.  CBG: No results for input(s): GLUCAP in the last 168 hours. Recent Results (from the past 240 hour(s))  Resp Panel by RT-PCR (Flu A&B, Covid) Nasopharyngeal Swab     Status: None   Collection Time: 03/17/21 10:08 PM   Specimen: Nasopharyngeal Swab; Nasopharyngeal(NP) swabs in vial transport medium  Result  Value Ref Range Status   SARS Coronavirus 2 by RT PCR NEGATIVE NEGATIVE Final    Comment: (NOTE) SARS-CoV-2 target nucleic acids are NOT DETECTED.  The SARS-CoV-2 RNA is generally detectable in upper respiratory specimens during the acute phase of infection. The lowest concentration of SARS-CoV-2 viral copies this assay can detect is 138 copies/mL. A negative result does not preclude SARS-Cov-2 infection and should not be used as the sole basis for treatment or other patient management decisions. A negative result may occur with  improper specimen collection/handling, submission of specimen other than nasopharyngeal swab, presence of viral mutation(s) within the areas targeted by this assay, and inadequate number of viral copies(<138 copies/mL). A negative result must be combined with clinical observations, patient history, and epidemiological information. The expected result is Negative.  Fact Sheet for Patients:  EntrepreneurPulse.com.au  Fact Sheet for Healthcare Providers:  IncredibleEmployment.be  This test is no t yet approved or cleared by the [place] FDA and  has been  authorized for detection and/or diagnosis of SARS-CoV-2 by FDA under an Emergency Use Authorization (EUA). This EUA will remain  in effect (meaning this test can be used) for the duration of the COVID-19 declaration under Section 564(b)(1) of the Act, 21 U.S.C.section 360bbb-3(b)(1), unless the authorization is terminated  or revoked sooner.       Influenza A by PCR NEGATIVE NEGATIVE Final   Influenza B by PCR NEGATIVE NEGATIVE Final    Comment: (NOTE) The Xpert Xpress SARS-CoV-2/FLU/RSV plus assay is intended as an aid in the diagnosis of influenza from Nasopharyngeal swab specimens and should not be used as a sole basis for treatment. Nasal washings and aspirates are unacceptable for Xpert Xpress SARS-CoV-2/FLU/RSV testing.  Fact Sheet for Patients: EntrepreneurPulse.com.au  Fact Sheet for Healthcare Providers: IncredibleEmployment.be  This test is not yet approved or cleared by the [place] FDA and has been authorized for detection and/or diagnosis of SARS-CoV-2 by FDA under an Emergency Use Authorization (EUA). This EUA will remain in effect (meaning this test can be used) for the duration of the COVID-19 declaration under Section 564(b)(1) of the Act, 21 U.S.C. section 360bbb-3(b)(1), unless the authorization is terminated or revoked.  Performed at [facility], [place] [address] [place], [place] [address]       Studies: No results found.    Flora Lipps, MD  [facility] 03/25/2021  If 7PM-7AM, please contact night-coverage

## 2021-03-26 DIAGNOSIS — E039 Hypothyroidism, unspecified: Secondary | ICD-10-CM | POA: Diagnosis not present

## 2021-03-26 DIAGNOSIS — N179 Acute kidney failure, unspecified: Secondary | ICD-10-CM | POA: Diagnosis not present

## 2021-03-26 DIAGNOSIS — I4891 Unspecified atrial fibrillation: Secondary | ICD-10-CM | POA: Diagnosis not present

## 2021-03-26 LAB — MAGNESIUM: Magnesium: 2.4 mg/dL (ref 1.7–2.4)

## 2021-03-26 LAB — RENAL FUNCTION PANEL
Albumin: 2.4 g/dL — ABNORMAL LOW (ref 3.5–5.0)
Anion gap: 12 (ref 5–15)
BUN: 77 mg/dL — ABNORMAL HIGH (ref 8–23)
CO2: 18 mmol/L — ABNORMAL LOW (ref 22–32)
Calcium: 8.3 mg/dL — ABNORMAL LOW (ref 8.9–10.3)
Chloride: 106 mmol/L (ref 98–111)
Creatinine, Ser: 5.78 mg/dL — ABNORMAL HIGH (ref 0.61–1.24)
GFR, Estimated: 9 mL/min — ABNORMAL LOW (ref >60)
Glucose, Bld: 82 mg/dL (ref 70–99)
Phosphorus: 7.5 mg/dL — ABNORMAL HIGH (ref 2.5–4.6)
Potassium: 4.7 mmol/L (ref 3.5–5.1)
Sodium: 136 mmol/L (ref 135–145)

## 2021-03-26 LAB — CBC
HCT: 29.3 % — ABNORMAL LOW (ref 39.0–52.0)
Hemoglobin: 9.2 g/dL — ABNORMAL LOW (ref 13.0–17.0)
MCH: 29.8 pg (ref 26.0–34.0)
MCHC: 31.4 g/dL (ref 30.0–36.0)
MCV: 94.8 fL (ref 80.0–100.0)
Platelets: 232 10*3/uL (ref 150–400)
RBC: 3.09 MIL/uL — ABNORMAL LOW (ref 4.22–5.81)
RDW: 15.3 % (ref 11.5–15.5)
WBC: 6.3 10*3/uL (ref 4.0–10.5)
nRBC: 0 % (ref 0.0–0.2)

## 2021-03-26 MED ORDER — ALBUMIN HUMAN 25 % IV SOLN
12.5000 g | Freq: Once | INTRAVENOUS | Status: AC
  Administered 2021-03-26: 12.5 g via INTRAVENOUS
  Filled 2021-03-26: qty 50

## 2021-03-26 MED ORDER — FUROSEMIDE 10 MG/ML IJ SOLN
80.0000 mg | Freq: Once | INTRAMUSCULAR | Status: AC
  Administered 2021-03-26: 80 mg via INTRAVENOUS
  Filled 2021-03-26: qty 8

## 2021-03-26 MED ORDER — FUROSEMIDE 10 MG/ML IJ SOLN
80.0000 mg | Freq: Once | INTRAMUSCULAR | Status: AC
  Administered 2021-03-27: 80 mg via INTRAVENOUS
  Filled 2021-03-26: qty 8

## 2021-03-26 MED ORDER — ALBUMIN HUMAN 25 % IV SOLN
12.5000 g | Freq: Once | INTRAVENOUS | Status: AC
  Administered 2021-03-27: 12.5 g via INTRAVENOUS
  Filled 2021-03-26: qty 50

## 2021-03-26 NOTE — Progress Notes (Signed)
PROGRESS NOTE  Wayne Meadows KYH:[id number] DOB: 07/05/1934 DOA: 03/17/2021 PCP: [facility]   LOS: 8 days   Brief narrative: Wayne Meadows is a 87 y.o. male with history of chronic kidney disease stage IIIb, CVA and CAD, diabetes mellitus, COPD, hypertension was recently admitted for cellulitis of the lower extremity, discharged on antibiotic presented to hospital with increasing weakness, difficulty ambulating with increasing redness and swelling of his lower extremities.  In the ED, patient was noted to have a weeping of his bilateral lower extremities with erythema.  Creatinine had worsened from 2.9 to 4.8 on presentation.   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  Nephrology was consulted during hospitalization for management of fluid overload and renal failure.  Assessment/Plan:  Principal Problem:   Acute renal failure (ARF) (HCC) Active Problems:   CAD (coronary artery disease)   COPD (chronic obstructive pulmonary disease) (HCC)   Atrial fibrillation (HCC)   Renal cell cancer (HCC)   Essential hypertension   Centrilobular emphysema (HCC)   AKI (acute kidney injury) ([place])   Hypothyroidism   ARF (acute renal failure) (HCC)   Acute on chronic kidney disease stage IV with non-anion gap metabolic acidosis: Followed by nephrology.  On albumin and Lasix trial.  Urine output 1150 mL last 24 hours after Lasix.  Renal function slightly creeping up.   Renal ultrasound with echogenic kidneys consistent with CKD.   No uremic symptoms but again no meaningful recovery of renal functions.   Further management as per nephrology.    Gross bilateral lower extremity edema anasarca/venous insufficiency.  Patient did have a recent ABI which was normal.  Dopplers were negative for DVT at that time.  Continue Unna boots.   CAD  History of coronary stent placement.   Continue Plavix statin and beta-blockers.  No acute issues at this time.  Denies chest pain.  Has remained stable.  Essential hypertension  Continue amlodipine, clonidine and metoprolol on as needed hydralazine and IV diuretic.   Hypothyroidism Continue Synthroid   COPD  Compensated at this time.  Continue bronchodilators   Chronic anemia likely from renal disease, follow CBC.  Hemoglobin is a stable at 9-10.  Debility, deconditioning. Patient lives by himself at home and has a walker and cane at home.  Physical therapy has recommended skilled nursing facility on discharge.  Will refer to SNF once medically stable.  Disposition.  Likely to skilled nursing facility on discharge pending clinical improvement.  DVT prophylaxis: heparin injection 5,000 Units Start: 03/18/21 0600   Code Status: Full code  Family Communication: None.   Status is: Inpatient   Remains inpatient appropriate because: Worsening CKD, fluid overload, IV diuretics  Consultants: Nephrology  Procedures: None  Anti-infectives:  Rocephin IV 1/24>1/26  Anti-infectives (From admission, onward)    Start     Dose/Rate Route Frequency Ordered Stop   03/18/21 0200  cefTRIAXone (ROCEPHIN) 2 g in sodium chloride 0.9 % 100 mL IVPB  Status:  Discontinued        2 g 200 mL/hr over 30 Minutes Intravenous Every 24 hours 03/18/21 0158 03/20/21 1542      Subjective:  Patient seen and examined.  He was sleepy.  He was not very interested in conversation.  No other overnight events.  He denies any complaints.  Vitals:   03/26/21 1025 03/26/21 1026  BP: (!) 162/66   Pulse:  81  Resp:  Temp:    SpO2:      Intake/Output Summary (Last 24 hours) at 03/26/2021 1255 Last data filed at 03/26/2021 1610 Gross per 24 hour  Intake 1015.64 ml  Output 1025 ml  Net -9.36 ml   Filed Weights   03/24/21 0500 03/25/21 0503 03/26/21 0500  Weight: 96.5 kg 93.7 kg 94.8 kg   Body mass index is 26.83 kg/m.   Physical  Exam:  General: Looks fairly comfortable and sleeping in bed.  On 2 L oxygen. Cardiovascular: Bilateral clear.  S1-S2 normal.  Regular rate rhythm. Respiratory: No added sounds. Gastrointestinal: Soft.  Nontender.  Bowel sounds present. Ext: Both legs on Unna boot, visible swelling on the dorsum of the foot. Neuro: Alert on stimulation but mostly sleepy.  Moves all extremities.  Data Review: I have personally reviewed the following laboratory data and studies,  CBC: Recent Labs  Lab 03/21/21 0409 03/23/21 0354 03/24/21 0354 03/25/21 0347 03/26/21 0403  WBC 7.8 6.3 7.1 6.5 6.3  HGB 9.7* 9.9* 9.9* 10.2* 9.2*  HCT 31.7* 32.6* 31.4* 32.2* 29.3*  MCV 95.2 95.9 94.0 93.1 94.8  PLT 264 238 248 251 960   Basic Metabolic Panel: Recent Labs  Lab 03/20/21 0340 03/21/21 0409 03/23/21 0354 03/24/21 0354 03/25/21 0347 03/26/21 0403  NA 140 139 137 138 135 136  K 4.9 5.1 5.2* 5.0 5.1 4.7  CL 114* 112* 110 109 108 106  CO2 16* 19* 18* 19* 17* 18*  GLUCOSE 84 97 82 86 77 82  BUN 65* 69* 71* 74* 74* 77*  CREATININE 4.90* 4.81* 5.33* 5.31* 5.55* 5.78*  CALCIUM 8.3* 8.1* 8.4* 8.5* 8.3* 8.3*  MG 2.1 2.4 2.3 2.3 2.3 2.4  PHOS 5.8*  --   --  7.7* 7.1* 7.5*   Liver Function Tests: Recent Labs  Lab 03/20/21 0340 03/24/21 0354 03/25/21 0347 03/26/21 0403  AST 14* 12*  --   --   ALT 8 8  --   --   ALKPHOS 64 67  --   --   BILITOT 0.5 0.5  --   --   PROT 6.0* 6.2*  --   --   ALBUMIN 2.2* 2.2* 2.2* 2.4*   No results for input(s): LIPASE, AMYLASE in the last 168 hours. No results for input(s): AMMONIA in the last 168 hours. Cardiac Enzymes: No results for input(s): CKTOTAL, CKMB, CKMBINDEX, TROPONINI in the last 168 hours.  BNP (last 3 results) Recent Labs    03/07/21 1714 03/08/21 0509 03/17/21 2155  BNP 931.2* 915.5* 280.9*    ProBNP (last 3 results) No results for input(s): PROBNP in the last 8760 hours.  CBG: No results for input(s): GLUCAP in the last 168  hours. Recent Results (from the past 240 hour(s))  Resp Panel by RT-PCR (Flu A&B, Covid) Nasopharyngeal Swab     Status: None   Collection Time: 03/17/21 10:08 PM   Specimen: Nasopharyngeal Swab; Nasopharyngeal(NP) swabs in vial transport medium  Result Value Ref Range Status   SARS Coronavirus 2 by RT PCR NEGATIVE NEGATIVE Final    Comment: (NOTE) SARS-CoV-2 target nucleic acids are NOT DETECTED.  The SARS-CoV-2 RNA is generally detectable in upper respiratory specimens during the acute phase of infection. The lowest concentration of SARS-CoV-2 viral copies this assay can detect is 138 copies/mL. A negative result does not preclude SARS-Cov-2 infection and should not be used as the sole basis for treatment or other patient management decisions. A negative result may occur with  improper specimen collection/handling,  submission of specimen other than nasopharyngeal swab, presence of viral mutation(s) within the areas targeted by this assay, and inadequate number of viral copies(<138 copies/mL). A negative result must be combined with clinical observations, patient history, and epidemiological information. The expected result is Negative.  Fact Sheet for Patients:  EntrepreneurPulse.com.au  Fact Sheet for Healthcare Providers:  IncredibleEmployment.be  This test is no t yet approved or cleared by the [place] FDA and  has been authorized for detection and/or diagnosis of SARS-CoV-2 by FDA under an Emergency Use Authorization (EUA). This EUA will remain  in effect (meaning this test can be used) for the duration of the COVID-19 declaration under Section 564(b)(1) of the Act, 21 U.S.C.section 360bbb-3(b)(1), unless the authorization is terminated  or revoked sooner.       Influenza A by PCR NEGATIVE NEGATIVE Final   Influenza B by PCR NEGATIVE NEGATIVE Final    Comment: (NOTE) The Xpert Xpress SARS-CoV-2/FLU/RSV plus assay is intended as  an aid in the diagnosis of influenza from Nasopharyngeal swab specimens and should not be used as a sole basis for treatment. Nasal washings and aspirates are unacceptable for Xpert Xpress SARS-CoV-2/FLU/RSV testing.  Fact Sheet for Patients: EntrepreneurPulse.com.au  Fact Sheet for Healthcare Providers: IncredibleEmployment.be  This test is not yet approved or cleared by the [place] FDA and has been authorized for detection and/or diagnosis of SARS-CoV-2 by FDA under an Emergency Use Authorization (EUA). This EUA will remain in effect (meaning this test can be used) for the duration of the COVID-19 declaration under Section 564(b)(1) of the Act, 21 U.S.C. section 360bbb-3(b)(1), unless the authorization is terminated or revoked.  Performed at [facility], [place] [address] [place], [place] [address]       Studies: No results found.  Total time spent: 30 minutes  Barb Merino, MD  [facility] 03/26/2021  If 7PM-7AM, please contact night-coverage

## 2021-03-26 NOTE — Progress Notes (Signed)
Physical Therapy Treatment Patient Details Name: Wayne Meadows MRN: [id number] DOB: 05-16-1934 Today's Date: 03/26/2021   History of Present Illness 87 y.o. male recently admitted for cellulitis of the lower extremity discharged on 1/17 on antibiotics. Pt presented to hospital with increasing weakness, difficulty ambulating with increasing redness and swelling of his lower extremities.  In the ED, patient was noted to have a weeping of his bilateral lower extremities with erythema.  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  PMH significant for HTN, CAD with stents, history of CVA, COPD (not on home O2), arthritis, tobacco abuse in remission presented in the ED with complaints of pain, swelling and redness of both lower extremities. Dx of recurrent cellulitis and ARF.    PT Comments    Pt in bed on 3 lts sats 93%.  Assisted OOB required increased time and effort.  General bed mobility comments: extra time to rise ansd scoot to bed edge. General transfer comment: used B platform EVA walker this session for increased support and to increase mobility.General Gait Details: used EVA walker for increased support and + 2 assist for safety such that recliner was following.  Pt very weak and nervous.  Remained on 3 lts nasal with sats ranged from 86 - 92% and instructions on purse lip breathing as well as several standing rest breaks.  Pt tolerated amb 45 feet but afterward became nausea with dry heaving.  RN called to room.  Pt will need ST Rehab at SNF  Recommendations for follow up therapy are one component of a multi-disciplinary discharge planning process, led by the attending physician.  Recommendations may be updated based on patient status, additional functional criteria and insurance authorization.  Follow Up Recommendations  Skilled nursing-short term rehab (<3 hours/day)     Assistance  Recommended at Discharge    Patient can return home with the following Assistance with cooking/housework;Assist for transportation;Help with stairs or ramp for entrance;Two people to help with walking and/or transfers;A lot of help with bathing/dressing/bathroom   Equipment Recommendations  None recommended by PT    Recommendations for Other Services       Precautions / Restrictions Precautions Precautions: Fall Precaution Comments: monitor sats Restrictions Weight Bearing Restrictions: No     Mobility  Bed Mobility Overal bed mobility: Needs Assistance Bed Mobility: Supine to Sit     Supine to sit: Min assist, HOB elevated     General bed mobility comments: extra time to rise ansd scoot to bed edge.    Transfers Overall transfer level: Needs assistance Equipment used: Bilateral platform walker Transfers: Sit to/from Stand Sit to Stand: Mod assist, Min assist           General transfer comment: used B platform EVA walker this session for increased support and to increase mobility.    Ambulation/Gait Ambulation/Gait assistance: Min assist, Mod assist Gait Distance (Feet): 45 Feet Assistive device: Bilateral platform walker (EVA walker) Gait Pattern/deviations: Step-through pattern, Decreased stride length       General Gait Details: used EVA walker for increased support and + 2 assist for safety such that recliner was following.  Pt very weak and nervous.  Remained on 3 lts nasal with sats ranged from 86 - 92% and instructions on purse lip breathing as well as several standing rest breaks.  Pt tolerated amb 45 feet but afterward became nausea with dry heaving.  RN called to room.  Stairs             Wheelchair Mobility    Modified Rankin (Stroke Patients Only)       Balance                                            Cognition Arousal/Alertness: Awake/alert Behavior During Therapy: Flat affect Overall Cognitive Status: Within  Functional Limits for tasks assessed                                 General Comments: AxO x 3 very pleasant but does not feel well, tired        Exercises      General Comments        Pertinent Vitals/Pain Pain Assessment Pain Assessment: Faces Pain Location: back pain "chronic" Pain Descriptors / Indicators: Discomfort, Grimacing    Home Living                          Prior Function            PT Goals (current goals can now be found in the care plan section) Progress towards PT goals: Progressing toward goals    Frequency    Min 2X/week      PT Plan Current plan remains appropriate    Co-evaluation              AM-PAC PT "6 Clicks" Mobility   Outcome Measure  Help needed turning from your back to your side while in a flat bed without using bedrails?: A Little Help needed moving from lying on your back to sitting on the side of a flat bed without using bedrails?: A Little Help needed moving to and from a bed to a chair (including a wheelchair)?: A Lot Help needed standing up from a chair using your arms (e.g., wheelchair or bedside chair)?: A Lot Help needed to walk in hospital room?: A Lot Help needed climbing 3-5 steps with a railing? : Total 6 Click Score: 13    End of Session Equipment Utilized During Treatment: Gait belt Activity Tolerance: Other (comment) (nausea) Patient left: in chair;with call bell/phone within reach;with chair alarm set Nurse Communication: Mobility status;Patient requests pain meds PT Visit Diagnosis: Difficulty in walking, not elsewhere classified (R26.2);Pain     Time: 1505-1530 PT Time Calculation (min) (ACUTE ONLY): 25 min  Charges:  $Gait Training: 8-22 mins $Therapeutic Activity: 8-22 mins                     Rica Koyanagi  PTA Acute  Rehabilitation Services Pager      [phone] Office      [phone]

## 2021-03-26 NOTE — Progress Notes (Signed)
Patient ID: Wayne Meadows, male   DOB: [birth year], 87 y.o.   MRN: [id number] [name] Progress Note   Assessment/ Plan:   1. Acute kidney Injury on chronic kidney disease stage IIIb: Baseline creatinine around 2.0 and admission data/labs indicative of acute kidney injury likely due to CHF decompensation (decreased effective arterial blood volume).  He has had another 24 hours of relatively fixed urine output on albumin/furosemide and the plan is to continue another 24-hour attempt at diuresis; in spite of his slowly worsening renal function, he does not have any acute indications for dialysis. 2.  Hypertension: Blood pressures are intermittently elevated while on clonidine, amlodipine and metoprolol.  Continue to monitor cautiously with ongoing diuresis to limit hemodynamic fluctuations. 3.  Anemia: Without overt blood loss and likely secondary to chronic illness 4.  Non-anion gap metabolic acidosis: Secondary to progressive chronic kidney disease with element of acute kidney injury, will monitor with ongoing diuresis/potential renal recovery.  Subjective:   Reports that he continues to feel better even with modest amounts of diuresis   Objective:   BP (!) 162/66    Pulse 81    Temp 97.6 F (36.4 C) (Oral)    Resp 15    Ht 6\' 2"  (1.88 m)    Wt 94.8 kg    SpO2 90%    BMI 26.83 kg/m   Intake/Output Summary (Last 24 hours) at 03/26/2021 1240 Last data filed at 03/26/2021 9924 Gross per 24 hour  Intake 1015.64 ml  Output 1025 ml  Net -9.36 ml   Weight change: 1.102 kg  Physical Exam: Gen: Comfortably sleeping in bed, awakens easily to conversation CVS: Pulse regular rhythm, normal rate, S1 and S2 normal Resp: Diminished breath sounds over bases with fine rales left side Abd: Soft, obese, nontender Ext: 1-2+ over thighs/lower back, trace edema over legs with Unna boots  Imaging: No results found.  Labs: BMET Recent Labs  Lab 03/20/21 0340 03/21/21 0409  03/23/21 0354 03/24/21 0354 03/25/21 0347 03/26/21 0403  NA 140 139 137 138 135 136  K 4.9 5.1 5.2* 5.0 5.1 4.7  CL 114* 112* 110 109 108 106  CO2 16* 19* 18* 19* 17* 18*  GLUCOSE 84 97 82 86 77 82  BUN 65* 69* 71* 74* 74* 77*  CREATININE 4.90* 4.81* 5.33* 5.31* 5.55* 5.78*  CALCIUM 8.3* 8.1* 8.4* 8.5* 8.3* 8.3*  PHOS 5.8*  --   --  7.7* 7.1* 7.5*   CBC Recent Labs  Lab 03/23/21 0354 03/24/21 0354 03/25/21 0347 03/26/21 0403  WBC 6.3 7.1 6.5 6.3  HGB 9.9* 9.9* 10.2* 9.2*  HCT 32.6* 31.4* 32.2* 29.3*  MCV 95.9 94.0 93.1 94.8  PLT 238 248 251 232    Medications:     amLODipine  10 mg Oral Daily   aspirin EC  81 mg Oral Daily   Chlorhexidine Gluconate Cloth  6 each Topical Daily   cloNIDine  0.1 mg Oral Daily   clopidogrel  75 mg Oral Daily   docusate sodium  100 mg Oral BID   ferrous sulfate  325 mg Oral BID WC   heparin  5,000 Units Subcutaneous Q8H   hydrocerin   Topical Once per day on Mon Thu   levothyroxine  50 mcg Oral Q0600   lidocaine  1 patch Transdermal Q24H   melatonin  3 mg Oral QHS   metoprolol tartrate  25 mg Oral Daily   polyethylene glycol  17 g Oral Daily   pravastatin  40 mg Oral QHS   Elmarie Shiley, MD 03/26/2021, 12:40 PM

## 2021-03-27 ENCOUNTER — Inpatient Hospital Stay (HOSPITAL_COMMUNITY): Payer: No Typology Code available for payment source

## 2021-03-27 DIAGNOSIS — N179 Acute kidney failure, unspecified: Secondary | ICD-10-CM | POA: Diagnosis not present

## 2021-03-27 DIAGNOSIS — I4891 Unspecified atrial fibrillation: Secondary | ICD-10-CM | POA: Diagnosis not present

## 2021-03-27 DIAGNOSIS — E039 Hypothyroidism, unspecified: Secondary | ICD-10-CM | POA: Diagnosis not present

## 2021-03-27 LAB — BASIC METABOLIC PANEL
Anion gap: 12 (ref 5–15)
BUN: 77 mg/dL — ABNORMAL HIGH (ref 8–23)
CO2: 18 mmol/L — ABNORMAL LOW (ref 22–32)
Calcium: 8.5 mg/dL — ABNORMAL LOW (ref 8.9–10.3)
Chloride: 107 mmol/L (ref 98–111)
Creatinine, Ser: 6.3 mg/dL — ABNORMAL HIGH (ref 0.61–1.24)
GFR, Estimated: 8 mL/min — ABNORMAL LOW (ref >60)
Glucose, Bld: 81 mg/dL (ref 70–99)
Potassium: 4.6 mmol/L (ref 3.5–5.1)
Sodium: 137 mmol/L (ref 135–145)

## 2021-03-27 LAB — ANTINUCLEAR ANTIBODIES, IFA: ANA Ab, IFA: NEGATIVE

## 2021-03-27 MED ORDER — LANTHANUM CARBONATE 500 MG PO CHEW
1000.0000 mg | CHEWABLE_TABLET | Freq: Three times a day (TID) | ORAL | Status: DC
  Administered 2021-03-27 – 2021-04-04 (×12): 1000 mg via ORAL
  Filled 2021-03-27 (×19): qty 2

## 2021-03-27 MED ORDER — SODIUM CHLORIDE 0.9 % IV SOLN
12.5000 mg | Freq: Three times a day (TID) | INTRAVENOUS | Status: DC | PRN
  Administered 2021-03-27: 12.5 mg via INTRAVENOUS
  Filled 2021-03-27: qty 0.5
  Filled 2021-03-27: qty 12.5

## 2021-03-27 NOTE — Progress Notes (Signed)
Orthopedic Tech Progress Note Patient Details:  Wayne Meadows [birth year] [id number]  Patient ID: [name], male   DOB: [birth year], 87 y.o.   MRN: [id number]  [name] 03/27/2021, 11:22 AM Bi lat unna boot applied

## 2021-03-27 NOTE — Progress Notes (Signed)
PROGRESS NOTE  Wayne Meadows VQQ:[id number] DOB: 24-Oct-1934 DOA: 03/17/2021 PCP: [facility]   LOS: 9 days   Brief narrative: Wayne Meadows is a 87 y.o. male with history of chronic kidney disease stage IIIb, CVA and CAD, diabetes mellitus, COPD, hypertension was recently admitted for cellulitis of the lower extremity, discharged on antibiotic presented to hospital with increasing weakness, difficulty ambulating with increasing redness and swelling of his lower extremities.  In the ED, patient was noted to have weeping of his bilateral lower extremities with erythema.  Creatinine had worsened from 2.9 to 4.8 on presentation.   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  Nephrology was consulted during hospitalization for management of fluid overload and renal failure.  Assessment/Plan:  Principal Problem:   Acute renal failure (ARF) (HCC) Active Problems:   CAD (coronary artery disease)   COPD (chronic obstructive pulmonary disease) (HCC)   Atrial fibrillation (HCC)   Renal cell cancer (HCC)   Essential hypertension   Centrilobular emphysema (HCC)   AKI (acute kidney injury) ([place])   Hypothyroidism   ARF (acute renal failure) (HCC)   Acute on chronic kidney disease stage IV with non-anion gap metabolic acidosis: Followed by nephrology.  On intermittent albumin and Lasix trial.  Urine output 1400 mL last 24 hours after Lasix.  Renal function continues to worsen. Renal ultrasound with echogenic kidneys consistent with CKD.   No uremic symptoms but again no meaningful recovery of renal functions.   Further management as per nephrology.   Holding diuretics today and monitoring.  Gross bilateral lower extremity edema anasarca/venous insufficiency.  Patient did have a recent ABI which was normal.  Dopplers were negative for DVT at that time.  Continue Unna boots.   Changed today.  CAD  History of coronary stent placement.  Continue Plavix, statin and beta-blockers.  No acute issues at this time.  Denies chest pain.  Has remained stable.  Essential hypertension  Continue amlodipine, clonidine and metoprolol on as needed hydralazine and IV diuretic.   Hypothyroidism Continue Synthroid   COPD  Compensated at this time.  Continue bronchodilators   Chronic anemia likely from renal disease, follow CBC.  Hemoglobin is a stable at 9-10.  Debility, deconditioning. Patient lives by himself at home and has a walker and cane at home.  Physical therapy has recommended skilled nursing facility on discharge.  Will refer to SNF once medically stable.  Disposition.  Likely to skilled nursing facility on discharge pending clinical improvement.  DVT prophylaxis: heparin injection 5,000 Units Start: 03/18/21 0600   Code Status: Full code  Family Communication: Daughter on the phone.   Status is: Inpatient   Remains inpatient appropriate because: Worsening CKD, fluid overload, IV diuretics  Consultants: Nephrology  Procedures: None  Anti-infectives:  Rocephin IV 1/24>1/26  Anti-infectives (From admission, onward)    Start     Dose/Rate Route Frequency Ordered Stop   03/18/21 0200  cefTRIAXone (ROCEPHIN) 2 g in sodium chloride 0.9 % 100 mL IVPB  Status:  Discontinued        2 g 200 mL/hr over 30 Minutes Intravenous Every 24 hours 03/18/21 0158 03/20/21 1542      Subjective:  Patient seen and examined.  Poor historian.  He himself denies any complaints.  No family at the bedside.  No other overnight events.  Vitals:   03/27/21 0932 03/27/21 0933  BP: (!) 149/68  Pulse:  82  Resp:    Temp:    SpO2:      Intake/Output Summary (Last 24 hours) at 03/27/2021 1256 Last data filed at 03/27/2021 0541 Gross per 24 hour  Intake 390 ml  Output 1100 ml  Net -710 ml   Filed Weights   03/25/21 0503 03/26/21 0500 03/27/21 0500  Weight: 93.7 kg  94.8 kg 93.2 kg   Body mass index is 26.38 kg/m.   Physical Exam:  General: Looks fairly comfortable and communicative. On 2-3 L oxygen. Cardiovascular: Bilateral clear.  S1-S2 normal.  Regular rate rhythm. Respiratory: No added sounds. Gastrointestinal: Soft.  Nontender.  Bowel sounds present.  Distended but nontender. Ext: Both legs on Unna boot, no swelling on the dorsum of the foot. Neuro: Alert and awake. Moves all extremities.  Data Review: I have personally reviewed the following laboratory data and studies,  CBC: Recent Labs  Lab 03/21/21 0409 03/23/21 0354 03/24/21 0354 03/25/21 0347 03/26/21 0403  WBC 7.8 6.3 7.1 6.5 6.3  HGB 9.7* 9.9* 9.9* 10.2* 9.2*  HCT 31.7* 32.6* 31.4* 32.2* 29.3*  MCV 95.2 95.9 94.0 93.1 94.8  PLT 264 238 248 251 350   Basic Metabolic Panel: Recent Labs  Lab 03/21/21 0409 03/23/21 0354 03/24/21 0354 03/25/21 0347 03/26/21 0403 03/27/21 0714  NA 139 137 138 135 136 137  K 5.1 5.2* 5.0 5.1 4.7 4.6  CL 112* 110 109 108 106 107  CO2 19* 18* 19* 17* 18* 18*  GLUCOSE 97 82 86 77 82 81  BUN 69* 71* 74* 74* 77* 77*  CREATININE 4.81* 5.33* 5.31* 5.55* 5.78* 6.30*  CALCIUM 8.1* 8.4* 8.5* 8.3* 8.3* 8.5*  MG 2.4 2.3 2.3 2.3 2.4  --   PHOS  --   --  7.7* 7.1* 7.5*  --    Liver Function Tests: Recent Labs  Lab 03/24/21 0354 03/25/21 0347 03/26/21 0403  AST 12*  --   --   ALT 8  --   --   ALKPHOS 67  --   --   BILITOT 0.5  --   --   PROT 6.2*  --   --   ALBUMIN 2.2* 2.2* 2.4*   No results for input(s): LIPASE, AMYLASE in the last 168 hours. No results for input(s): AMMONIA in the last 168 hours. Cardiac Enzymes: No results for input(s): CKTOTAL, CKMB, CKMBINDEX, TROPONINI in the last 168 hours.  BNP (last 3 results) Recent Labs    03/07/21 1714 03/08/21 0509 03/17/21 2155  BNP 931.2* 915.5* 280.9*    ProBNP (last 3 results) No results for input(s): PROBNP in the last 8760 hours.  CBG: No results for input(s): GLUCAP in  the last 168 hours. Recent Results (from the past 240 hour(s))  Resp Panel by RT-PCR (Flu A&B, Covid) Nasopharyngeal Swab     Status: None   Collection Time: 03/17/21 10:08 PM   Specimen: Nasopharyngeal Swab; Nasopharyngeal(NP) swabs in vial transport medium  Result Value Ref Range Status   SARS Coronavirus 2 by RT PCR NEGATIVE NEGATIVE Final    Comment: (NOTE) SARS-CoV-2 target nucleic acids are NOT DETECTED.  The SARS-CoV-2 RNA is generally detectable in upper respiratory specimens during the acute phase of infection. The lowest concentration of SARS-CoV-2 viral copies this assay can detect is 138 copies/mL. A negative result does not preclude SARS-Cov-2 infection and should not be used as the sole basis for treatment or other patient management decisions. A negative result may occur with  improper specimen  collection/handling, submission of specimen other than nasopharyngeal swab, presence of viral mutation(s) within the areas targeted by this assay, and inadequate number of viral copies(<138 copies/mL). A negative result must be combined with clinical observations, patient history, and epidemiological information. The expected result is Negative.  Fact Sheet for Patients:  EntrepreneurPulse.com.au  Fact Sheet for Healthcare Providers:  IncredibleEmployment.be  This test is no t yet approved or cleared by the [place] FDA and  has been authorized for detection and/or diagnosis of SARS-CoV-2 by FDA under an Emergency Use Authorization (EUA). This EUA will remain  in effect (meaning this test can be used) for the duration of the COVID-19 declaration under Section 564(b)(1) of the Act, 21 U.S.C.section 360bbb-3(b)(1), unless the authorization is terminated  or revoked sooner.       Influenza A by PCR NEGATIVE NEGATIVE Final   Influenza B by PCR NEGATIVE NEGATIVE Final    Comment: (NOTE) The Xpert Xpress SARS-CoV-2/FLU/RSV plus assay is  intended as an aid in the diagnosis of influenza from Nasopharyngeal swab specimens and should not be used as a sole basis for treatment. Nasal washings and aspirates are unacceptable for Xpert Xpress SARS-CoV-2/FLU/RSV testing.  Fact Sheet for Patients: EntrepreneurPulse.com.au  Fact Sheet for Healthcare Providers: IncredibleEmployment.be  This test is not yet approved or cleared by the [place] FDA and has been authorized for detection and/or diagnosis of SARS-CoV-2 by FDA under an Emergency Use Authorization (EUA). This EUA will remain in effect (meaning this test can be used) for the duration of the COVID-19 declaration under Section 564(b)(1) of the Act, 21 U.S.C. section 360bbb-3(b)(1), unless the authorization is terminated or revoked.  Performed at [facility], [place] [address], [place], [place] [address]       Studies: No results found.  Total time spent: 30 minutes  Barb Merino, MD  [facility] 03/27/2021  If 7PM-7AM, please contact night-coverage

## 2021-03-27 NOTE — Progress Notes (Signed)
Patient ID: Wayne Meadows, male   DOB: 23-Aug-1934, 87 y.o.   MRN: [id number] [facility] Progress Note   Assessment/ Plan:   1. Acute kidney Injury on chronic kidney disease stage IIIb: Baseline creatinine around 2.0 and admission data/labs indicative of acute kidney injury likely due to CHF decompensation (decreased effective arterial blood volume).  At this time, I suspect that he is intravascularly depleted with some third spacing/pitting edema around his hips/thighs.  I will obtain a chest x-ray and hold diuresis for the next 24 hours to monitor.  No acute indication for dialysis. 2.  Hypertension: Blood pressures are intermittently elevated while on clonidine, amlodipine and metoprolol.  Continue to monitor cautiously with ongoing diuresis to limit hemodynamic fluctuations. 3.  Anemia: Without overt blood loss and likely secondary to chronic illness 4.  Non-anion gap metabolic acidosis: Secondary to progressive chronic kidney disease with element of acute kidney injury, will start sodium bicarbonate when bicarbonate level is <18. 5.  Hyperphosphatemia: Secondary to acute kidney injury on chronic kidney disease, begin phosphorus binder with meals.  Subjective:   Reports that he continues to feel better even with modest amounts of diuresis   Objective:   BP (!) 149/68 (BP Location: Left Arm)    Pulse 82    Temp 97.6 F (36.4 C) (Oral)    Resp 15    Ht 6\' 2"  (1.88 m)    Wt 93.2 kg    SpO2 92%    BMI 26.38 kg/m   Intake/Output Summary (Last 24 hours) at 03/27/2021 1216 Last data filed at 03/27/2021 0541 Gross per 24 hour  Intake 390 ml  Output 1100 ml  Net -710 ml   Weight change: -1.602 kg  Physical Exam: Gen: Comfortably sleeping in bed, awakens easily to conversation CVS: Pulse regular rhythm, normal rate, S1 and S2 normal Resp: Anteriorly clear to auscultation bilaterally, no distinct rales or rhonchi Abd: Soft, obese, nontender Ext: Trace-1+ edema over thighs/back,  trace edema over legs with Unna boots  Imaging: No results found.  Labs: BMET Recent Labs  Lab 03/21/21 0409 03/23/21 0354 03/24/21 0354 03/25/21 0347 03/26/21 0403 03/27/21 0714  NA 139 137 138 135 136 137  K 5.1 5.2* 5.0 5.1 4.7 4.6  CL 112* 110 109 108 106 107  CO2 19* 18* 19* 17* 18* 18*  GLUCOSE 97 82 86 77 82 81  BUN 69* 71* 74* 74* 77* 77*  CREATININE 4.81* 5.33* 5.31* 5.55* 5.78* 6.30*  CALCIUM 8.1* 8.4* 8.5* 8.3* 8.3* 8.5*  PHOS  --   --  7.7* 7.1* 7.5*  --    CBC Recent Labs  Lab 03/23/21 0354 03/24/21 0354 03/25/21 0347 03/26/21 0403  WBC 6.3 7.1 6.5 6.3  HGB 9.9* 9.9* 10.2* 9.2*  HCT 32.6* 31.4* 32.2* 29.3*  MCV 95.9 94.0 93.1 94.8  PLT 238 248 251 232    Medications:     amLODipine  10 mg Oral Daily   aspirin EC  81 mg Oral Daily   Chlorhexidine Gluconate Cloth  6 each Topical Daily   cloNIDine  0.1 mg Oral Daily   clopidogrel  75 mg Oral Daily   docusate sodium  100 mg Oral BID   ferrous sulfate  325 mg Oral BID WC   heparin  5,000 Units Subcutaneous Q8H   hydrocerin   Topical Once per day on Mon Thu   levothyroxine  50 mcg Oral Q0600   lidocaine  1 patch Transdermal Q24H   melatonin  3 mg Oral QHS   metoprolol tartrate  25 mg Oral Daily   polyethylene glycol  17 g Oral Daily   pravastatin  40 mg Oral QHS   Elmarie Shiley, MD 03/27/2021, 12:16 PM

## 2021-03-28 DIAGNOSIS — I4891 Unspecified atrial fibrillation: Secondary | ICD-10-CM | POA: Diagnosis not present

## 2021-03-28 DIAGNOSIS — N179 Acute kidney failure, unspecified: Secondary | ICD-10-CM | POA: Diagnosis not present

## 2021-03-28 DIAGNOSIS — E039 Hypothyroidism, unspecified: Secondary | ICD-10-CM | POA: Diagnosis not present

## 2021-03-28 LAB — RENAL FUNCTION PANEL
Albumin: 2.6 g/dL — ABNORMAL LOW (ref 3.5–5.0)
Anion gap: 10 (ref 5–15)
BUN: 79 mg/dL — ABNORMAL HIGH (ref 8–23)
CO2: 19 mmol/L — ABNORMAL LOW (ref 22–32)
Calcium: 8.2 mg/dL — ABNORMAL LOW (ref 8.9–10.3)
Chloride: 109 mmol/L (ref 98–111)
Creatinine, Ser: 6.42 mg/dL — ABNORMAL HIGH (ref 0.61–1.24)
GFR, Estimated: 8 mL/min — ABNORMAL LOW (ref >60)
Glucose, Bld: 88 mg/dL (ref 70–99)
Phosphorus: 8 mg/dL — ABNORMAL HIGH (ref 2.5–4.6)
Potassium: 4.5 mmol/L (ref 3.5–5.1)
Sodium: 138 mmol/L (ref 135–145)

## 2021-03-28 NOTE — TOC Progression Note (Signed)
Transition of Care [facility]) - Progression Note    Patient Details  Name: Wayne Meadows MRN: [id number] Date of Birth: [birth year]  Transition of Care [facility]) CM/SW Contact  Purcell Mouton, RN Phone Number: 03/28/2021, 3:43 PM  Clinical Narrative:    TOC will continue to follow for placement.    Expected Discharge Plan: [place] Barriers to Discharge: No Barriers Identified  Expected Discharge Plan and Services Expected Discharge Plan: [place]   Discharge Planning Services: CM Consult Post Acute Care Choice: [place] Living arrangements for the past 2 months: Single Family Home                                       Social Determinants of Health (SDOH) Interventions    Readmission Risk Interventions Readmission Risk Prevention Plan 03/21/2021 03/19/2021  Transportation Screening Complete -  [place] or Home Care Consult Complete -  SW Recovery Care/Counseling Consult Complete Complete  Skilled Nursing Facility Complete Complete  Some recent data might be hidden

## 2021-03-28 NOTE — Progress Notes (Signed)
Patient ID: Wayne Meadows, male   DOB: 24-Jun-1934, 87 y.o.   MRN: [id number] [facility] Progress Note   Assessment/ Plan:   1. Acute kidney Injury on chronic kidney disease stage IIIb: Baseline creatinine around 2.0 and admission data/labs indicative of acute kidney injury likely due to CHF decompensation (decreased effective arterial blood volume).  Diuretics discontinued yesterday for concerns of developing intravascular volume contraction-creatinine only slightly worse today with continued urine output and no significant worsening of respiratory status/oxygen requirements.  We will continue to follow him closely and agree with holding Plavix at this time in case he needs placement of TDC to start dialysis. 2.  Hypertension: Blood pressures are intermittently elevated while on clonidine, amlodipine and metoprolol.  Continue to monitor cautiously with ongoing diuresis to limit hemodynamic fluctuations. 3.  Anemia: Without overt blood loss and likely secondary to chronic illness 4.  Non-anion gap metabolic acidosis: Secondary to progressive chronic kidney disease with element of acute kidney injury, will start sodium bicarbonate when bicarbonate level is <18. 5.  Hyperphosphatemia: Secondary to acute kidney injury on chronic kidney disease, begin phosphorus binder with meals.  Subjective:   Reports to be feeling fair, denies any chest pain and has chronic/unchanged shortness of breath that is no worse than yesterday.   Objective:   BP (!) 148/57 (BP Location: Left Arm)    Pulse 74    Temp 98.7 F (37.1 C) (Oral)    Resp 18    Ht 6\' 2"  (1.88 m)    Wt 94.4 kg    SpO2 92%    BMI 26.72 kg/m   Intake/Output Summary (Last 24 hours) at 03/28/2021 1302 Last data filed at 03/28/2021 0500 Gross per 24 hour  Intake 290 ml  Output 1225 ml  Net -935 ml   Weight change: -0.6 kg  Physical Exam: Gen: Sitting up comfortably in bed, nurse technician at bedside getting vital signs CVS: Pulse  regular rhythm, normal rate, S1 and S2 normal Resp: Anteriorly clear to auscultation bilaterally, no distinct rales or rhonchi Abd: Soft, obese, nontender Ext: Trace-1+ edema over thighs/back, trace edema over legs with Unna boots  Imaging: DG Chest 2 View  Result Date: 03/27/2021 CLINICAL DATA:  Weakness, COPD, former smoker EXAM: CHEST - 2 VIEW COMPARISON:  Chest x-ray 03/09/2021. FINDINGS: The heart and mediastinal contours are unchanged. Aortic calcification. Bibasilar vague airspace opacities. Chronic coarsened insert markings with no overt pulmonary edema. Bilateral at least small volume pleural effusions. Effusion. No pneumothorax. No acute osseous abnormality. IMPRESSION: 1. Bilateral at least small volume pleural effusions. 2. Bibasilar vague airspace opacities may represent a combination of atelectasis versus infection/inflammation. 3. Aortic Atherosclerosis (ICD10-I70.0) and Emphysema (ICD10-J43.9). Electronically Signed   By: Iven Finn M.D.   On: 03/27/2021 15:05    Labs: BMET Recent Labs  Lab 03/23/21 0354 03/24/21 0354 03/25/21 0347 03/26/21 0403 03/27/21 0714 03/28/21 0405  NA 137 138 135 136 137 138  K 5.2* 5.0 5.1 4.7 4.6 4.5  CL 110 109 108 106 107 109  CO2 18* 19* 17* 18* 18* 19*  GLUCOSE 82 86 77 82 81 88  BUN 71* 74* 74* 77* 77* 79*  CREATININE 5.33* 5.31* 5.55* 5.78* 6.30* 6.42*  CALCIUM 8.4* 8.5* 8.3* 8.3* 8.5* 8.2*  PHOS  --  7.7* 7.1* 7.5*  --  8.0*   CBC Recent Labs  Lab 03/23/21 0354 03/24/21 0354 03/25/21 0347 03/26/21 0403  WBC 6.3 7.1 6.5 6.3  HGB 9.9* 9.9* 10.2* 9.2*  HCT 32.6* 31.4* 32.2* 29.3*  MCV 95.9 94.0 93.1 94.8  PLT 238 248 251 232    Medications:     amLODipine  10 mg Oral Daily   aspirin EC  81 mg Oral Daily   Chlorhexidine Gluconate Cloth  6 each Topical Daily   cloNIDine  0.1 mg Oral Daily   docusate sodium  100 mg Oral BID   ferrous sulfate  325 mg Oral BID WC   heparin  5,000 Units Subcutaneous Q8H   hydrocerin    Topical Once per day on Mon Thu   lanthanum  1,000 mg Oral TID WC   levothyroxine  50 mcg Oral Q0600   lidocaine  1 patch Transdermal Q24H   melatonin  3 mg Oral QHS   metoprolol tartrate  25 mg Oral Daily   polyethylene glycol  17 g Oral Daily   pravastatin  40 mg Oral QHS   Elmarie Shiley, MD 03/28/2021, 1:02 PM

## 2021-03-28 NOTE — Progress Notes (Signed)
Pt was transported to 4th floor without issue. Settled and reoriented to the unit.

## 2021-03-28 NOTE — Progress Notes (Signed)
PROGRESS NOTE  AHAD COLARUSSO XFG:[id number] DOB: 1934-04-20 DOA: 03/17/2021 PCP: [facility]   LOS: 10 days   Brief narrative: Wayne Meadows is a 87 y.o. male with history of chronic kidney disease stage IIIb, CVA and CAD, diabetes mellitus, COPD, hypertension was recently admitted for cellulitis of the lower extremity, discharged on antibiotic presented to hospital with increasing weakness, difficulty ambulating with increasing redness and swelling of his lower extremities.  In the ED, patient was noted to have weeping of his bilateral lower extremities with erythema.  Creatinine had worsened from 2.9 to 4.8 on presentation.   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  Nephrology was consulted during hospitalization for management of fluid overload and renal failure.  Assessment/Plan:  Principal Problem:   Acute renal failure (ARF) (HCC) Active Problems:   CAD (coronary artery disease)   COPD (chronic obstructive pulmonary disease) (HCC)   Atrial fibrillation (HCC)   Renal cell cancer (HCC)   Essential hypertension   Centrilobular emphysema (HCC)   AKI (acute kidney injury) ([place])   Hypothyroidism   ARF (acute renal failure) (HCC)   Acute on chronic kidney disease stage IV with non-anion gap metabolic acidosis: Followed by nephrology.  On intermittent albumin and Lasix trial.  Urine output 1200 mL last 24 hours. Renal function continues to worsen. Renal ultrasound with echogenic kidneys consistent with CKD.   No uremic symptoms but again no meaningful recovery of renal functions.   Further management as per nephrology.   Patient is potentially needing hemodialysis, will hold Plavix for now.  Gross bilateral lower extremity edema anasarca/venous insufficiency.  Patient did have a recent ABI which was normal.  Dopplers were negative for DVT at that time.   Continue Unna boots.  Changed 2/2.  CAD  History of coronary stent placement.  No acute issues at this time.  Denies chest pain.  Has remained stable.  Continue aspirin.  Holding Plavix today in case he needs a tunneled catheter.  Essential hypertension  Continue amlodipine, clonidine and metoprolol on as needed hydralazine and IV diuretic.   Hypothyroidism Continue Synthroid   COPD  Compensated at this time.  Continue bronchodilators   Chronic anemia likely from renal disease, follow CBC.  Hemoglobin is a stable at 9-10.  Debility, deconditioning. Patient lives by himself at home and has a walker and cane at home.  Physical therapy has recommended skilled nursing facility on discharge.  Will refer to SNF once medically stable.  Disposition.  Likely to skilled nursing facility on discharge pending clinical improvement.  DVT prophylaxis: heparin injection 5,000 Units Start: 03/18/21 0600   Code Status: Full code  Family Communication: Daughter on the phone 2/2.   Status is: Inpatient   Remains inpatient appropriate because: Worsening CKD, fluid overload, IV diuretics  Consultants: Nephrology  Procedures: None  Anti-infectives:  Rocephin IV 1/24>1/26  Anti-infectives (From admission, onward)    Start     Dose/Rate Route Frequency Ordered Stop   03/18/21 0200  cefTRIAXone (ROCEPHIN) 2 g in sodium chloride 0.9 % 100 mL IVPB  Status:  Discontinued        2 g 200 mL/hr over 30 Minutes Intravenous Every 24 hours 03/18/21 0158 03/20/21 1542      Subjective:  Patient seen and examined.  Patient himself feels well, he denies any complaints.  Vitals:   03/27/21 2035 03/28/21 0542  BP: (!) 147/68 Marland Kitchen)  148/57  Pulse: 73 74  Resp: 13 18  Temp: 99.1 F (37.3 C) 98.7 F (37.1 C)  SpO2: 92% 92%    Intake/Output Summary (Last 24 hours) at 03/28/2021 1110 Last data filed at 03/28/2021 0500 Gross per 24 hour  Intake 290 ml  Output 1225 ml  Net -935 ml   Filed Weights    03/27/21 0500 03/28/21 0428 03/28/21 0542  Weight: 93.2 kg 92.6 kg 94.4 kg   Body mass index is 26.72 kg/m.   Physical Exam:  General: Looks fairly comfortable and communicative.  On 2 L oxygen.  He was trying to eat breakfast.  Looks comfortable. Cardiovascular: Bilateral clear.  S1-S2 normal.  Regular rate rhythm. Respiratory: No added sounds. Gastrointestinal: Soft.  Nontender.  Bowel sounds present.   Ext: Both legs on Unna boot, no swelling on the dorsum of the foot. Neuro: Alert and awake. Moves all extremities.  Data Review: I have personally reviewed the following laboratory data and studies,  CBC: Recent Labs  Lab 03/23/21 0354 03/24/21 0354 03/25/21 0347 03/26/21 0403  WBC 6.3 7.1 6.5 6.3  HGB 9.9* 9.9* 10.2* 9.2*  HCT 32.6* 31.4* 32.2* 29.3*  MCV 95.9 94.0 93.1 94.8  PLT 238 248 251 856   Basic Metabolic Panel: Recent Labs  Lab 03/23/21 0354 03/24/21 0354 03/25/21 0347 03/26/21 0403 03/27/21 0714 03/28/21 0405  NA 137 138 135 136 137 138  K 5.2* 5.0 5.1 4.7 4.6 4.5  CL 110 109 108 106 107 109  CO2 18* 19* 17* 18* 18* 19*  GLUCOSE 82 86 77 82 81 88  BUN 71* 74* 74* 77* 77* 79*  CREATININE 5.33* 5.31* 5.55* 5.78* 6.30* 6.42*  CALCIUM 8.4* 8.5* 8.3* 8.3* 8.5* 8.2*  MG 2.3 2.3 2.3 2.4  --   --   PHOS  --  7.7* 7.1* 7.5*  --  8.0*   Liver Function Tests: Recent Labs  Lab 03/24/21 0354 03/25/21 0347 03/26/21 0403 03/28/21 0405  AST 12*  --   --   --   ALT 8  --   --   --   ALKPHOS 67  --   --   --   BILITOT 0.5  --   --   --   PROT 6.2*  --   --   --   ALBUMIN 2.2* 2.2* 2.4* 2.6*   No results for input(s): LIPASE, AMYLASE in the last 168 hours. No results for input(s): AMMONIA in the last 168 hours. Cardiac Enzymes: No results for input(s): CKTOTAL, CKMB, CKMBINDEX, TROPONINI in the last 168 hours.  BNP (last 3 results) Recent Labs    03/07/21 1714 03/08/21 0509 03/17/21 2155  BNP 931.2* 915.5* 280.9*    ProBNP (last 3 results) No  results for input(s): PROBNP in the last 8760 hours.  CBG: No results for input(s): GLUCAP in the last 168 hours. No results found for this or any previous visit (from the past 240 hour(s)).     Studies: DG Chest 2 View  Result Date: 03/27/2021 CLINICAL DATA:  Weakness, COPD, former smoker EXAM: CHEST - 2 VIEW COMPARISON:  Chest x-ray 03/09/2021. FINDINGS: The heart and mediastinal contours are unchanged. Aortic calcification. Bibasilar vague airspace opacities. Chronic coarsened insert markings with no overt pulmonary edema. Bilateral at least small volume pleural effusions. Effusion. No pneumothorax. No acute osseous abnormality. IMPRESSION: 1. Bilateral at least small volume pleural effusions. 2. Bibasilar vague airspace opacities may represent a combination of atelectasis versus infection/inflammation. 3. Aortic Atherosclerosis (  ICD10-I70.0) and Emphysema (ICD10-J43.9). Electronically Signed   By: Iven Finn M.D.   On: 03/27/2021 15:05    Total time spent: 30 minutes  Barb Merino, MD  [facility] 03/28/2021  If 7PM-7AM, please contact night-coverage

## 2021-03-28 NOTE — Progress Notes (Signed)
Physical Therapy Treatment Patient Details Name: Wayne Meadows MRN: [id number] DOB: 26-May-1934 Today's Date: 03/28/2021   History of Present Illness 87 y.o. male recently admitted for cellulitis of the lower extremity discharged on 1/17 on antibiotics. Pt presented to hospital with increasing weakness, difficulty ambulating with increasing redness and swelling of his lower extremities.  In the ED, patient was noted to have a weeping of his bilateral lower extremities with erythema.  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  PMH significant for HTN, CAD with stents, history of CVA, COPD (not on home O2), arthritis, tobacco abuse in remission presented in the ED with complaints of pain, swelling and redness of both lower extremities. Dx of recurrent cellulitis and ARF.    PT Comments    Pt in bed on 4 lts nasal at 96%.  Assisted OOB to amb required + 2 assist for safety.   General bed mobility comments: extra time to rise ansd scoot to bed edge.  rest break after.  General transfer comment: used B platform EVA walker this session for increased support and to increase mobility. General Gait Details: used B platform EVA walker for increased support and to decrease pt's anxiety.  Remained on 3 lts nasal avg sats 88% and HR 94.  Very limited activity tolerance due to weakness and 2/4 dyspnea.  Positioned to comfort in recliner.   Pt was not on oxygen prior to admit.  Unable to wean as sats decreased to 88% while on 3 lts with activity.   Recommendations for follow up therapy are one component of a multi-disciplinary discharge planning process, led by the attending physician.  Recommendations may be updated based on patient status, additional functional criteria and insurance authorization.  Follow Up Recommendations  Skilled nursing-short term rehab (<3 hours/day)     Assistance Recommended at  Discharge Frequent or constant Supervision/Assistance  Patient can return home with the following Assistance with cooking/housework;Assist for transportation;Help with stairs or ramp for entrance;Two people to help with walking and/or transfers;A lot of help with bathing/dressing/bathroom   Equipment Recommendations  None recommended by PT    Recommendations for Other Services       Precautions / Restrictions Precautions Precautions: Fall Precaution Comments: monitor sats Restrictions Weight Bearing Restrictions: No     Mobility  Bed Mobility Overal bed mobility: Needs Assistance Bed Mobility: Supine to Sit     Supine to sit: Min assist, HOB elevated     General bed mobility comments: extra time to rise ansd scoot to bed edge.  rest break after    Transfers Overall transfer level: Needs assistance Equipment used: Bilateral platform walker Transfers: Sit to/from Stand Sit to Stand: Min assist, +2 safety/equipment           General transfer comment: used B platform EVA walker this session for increased support and to increase mobility.    Ambulation/Gait Ambulation/Gait assistance: Min assist, Mod assist Gait Distance (Feet): 28 Feet Assistive device: Bilateral platform walker Gait Pattern/deviations: Step-through pattern, Decreased stride length Gait velocity: decreased     General Gait Details: used B platform EVA walker for increased support and to decrease pt's anxiety.  Remained on 3 lts nasal avg sats 88% and HR 94.  Very limited activity tolerance due to weakness and 2/4 dyspnea.   Stairs             Wheelchair Mobility    Modified Rankin (  Stroke Patients Only)       Balance                                            Cognition Arousal/Alertness: Awake/alert Behavior During Therapy: WFL for tasks assessed/performed Overall Cognitive Status: Within Functional Limits for tasks assessed                                  General Comments: AxO x 3 very pleasant but does not like to be rushed        Exercises      General Comments        Pertinent Vitals/Pain Pain Assessment Pain Assessment: Faces Faces Pain Scale: Hurts little more Pain Location: back pain "chronic"    Home Living                          Prior Function            PT Goals (current goals can now be found in the care plan section) Progress towards PT goals: Progressing toward goals    Frequency    Min 2X/week      PT Plan Current plan remains appropriate    Co-evaluation              AM-PAC PT "6 Clicks" Mobility   Outcome Measure  Help needed turning from your back to your side while in a flat bed without using bedrails?: A Little Help needed moving from lying on your back to sitting on the side of a flat bed without using bedrails?: A Little Help needed moving to and from a bed to a chair (including a wheelchair)?: A Little Help needed standing up from a chair using your arms (e.g., wheelchair or bedside chair)?: A Lot Help needed to walk in hospital room?: A Lot Help needed climbing 3-5 steps with a railing? : A Lot 6 Click Score: 15    End of Session Equipment Utilized During Treatment: Gait belt Activity Tolerance: Patient tolerated treatment well Patient left: in chair;with call bell/phone within reach;with chair alarm set Nurse Communication: Mobility status PT Visit Diagnosis: Difficulty in walking, not elsewhere classified (R26.2);Pain     Time: 8099-8338 PT Time Calculation (min) (ACUTE ONLY): 17 min  Charges:  $Gait Training: 8-22 mins                     {Natalie Mceuen  PTA Acute  Rehabilitation [employer]      [phone] Office      [phone]

## 2021-03-29 DIAGNOSIS — I4891 Unspecified atrial fibrillation: Secondary | ICD-10-CM | POA: Diagnosis not present

## 2021-03-29 DIAGNOSIS — N179 Acute kidney failure, unspecified: Secondary | ICD-10-CM | POA: Diagnosis not present

## 2021-03-29 DIAGNOSIS — E039 Hypothyroidism, unspecified: Secondary | ICD-10-CM | POA: Diagnosis not present

## 2021-03-29 LAB — BASIC METABOLIC PANEL
Anion gap: 12 (ref 5–15)
BUN: 83 mg/dL — ABNORMAL HIGH (ref 8–23)
CO2: 17 mmol/L — ABNORMAL LOW (ref 22–32)
Calcium: 8 mg/dL — ABNORMAL LOW (ref 8.9–10.3)
Chloride: 106 mmol/L (ref 98–111)
Creatinine, Ser: 6.95 mg/dL — ABNORMAL HIGH (ref 0.61–1.24)
GFR, Estimated: 7 mL/min — ABNORMAL LOW (ref >60)
Glucose, Bld: 114 mg/dL — ABNORMAL HIGH (ref 70–99)
Potassium: 4.7 mmol/L (ref 3.5–5.1)
Sodium: 135 mmol/L (ref 135–145)

## 2021-03-29 LAB — HEPATITIS B SURFACE ANTIBODY,QUALITATIVE: Hep B S Ab: NONREACTIVE

## 2021-03-29 LAB — HEPATITIS B SURFACE ANTIGEN: Hepatitis B Surface Ag: NONREACTIVE

## 2021-03-29 MED ORDER — FUROSEMIDE 10 MG/ML IJ SOLN
80.0000 mg | Freq: Three times a day (TID) | INTRAMUSCULAR | Status: DC
  Administered 2021-03-29 – 2021-04-02 (×11): 80 mg via INTRAVENOUS
  Filled 2021-03-29 (×11): qty 8

## 2021-03-29 MED ORDER — SODIUM BICARBONATE 650 MG PO TABS
650.0000 mg | ORAL_TABLET | Freq: Three times a day (TID) | ORAL | Status: DC
  Administered 2021-03-29 – 2021-03-31 (×7): 650 mg via ORAL
  Filled 2021-03-29 (×7): qty 1

## 2021-03-29 NOTE — Progress Notes (Signed)
PROGRESS NOTE  Wayne Meadows QQV:[id number] DOB: April 24, 1934 DOA: 03/17/2021 [facility]: [facility]   LOS: 11 days   Brief narrative: Wayne Meadows is a 87 y.o. male with history of chronic kidney disease stage IIIb, CVA and CAD, diabetes mellitus, COPD, hypertension was recently admitted for cellulitis of the lower extremity, discharged on antibiotic presented to hospital with increasing weakness, difficulty ambulating with increasing redness and swelling of his lower extremities.  In the ED, patient was noted to have weeping of his bilateral lower extremities with erythema.  Creatinine had worsened from 2.9 to 4.8 on presentation.   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  Nephrology was consulted during hospitalization for management of fluid overload and renal failure.  Assessment/Plan:  Principal Problem:   Acute renal failure (ARF) (HCC) Active Problems:   CAD (coronary artery disease)   COPD (chronic obstructive pulmonary disease) (HCC)   Atrial fibrillation (HCC)   Renal cell cancer (HCC)   Essential hypertension   Centrilobular emphysema (HCC)   AKI (acute kidney injury) ([place])   Hypothyroidism   ARF (acute renal failure) (HCC)   Acute on chronic kidney disease stage IV with non-anion gap metabolic acidosis: Followed by nephrology. Currently on conservative management. On intermittent albumin and Lasix trial.  Urine output 1000 mL last 24 hours. Renal function continues to worsen. Renal ultrasound with echogenic kidneys consistent with CKD.   No uremic symptoms but again no meaningful recovery of renal functions.   Further management as per nephrology.   Patient is potentially needing hemodialysis, will hold Plavix for now. Foley catheter exchanged with good outcome.  Gross bilateral lower extremity edema anasarca/venous insufficiency.  Patient did have a  recent ABI which was normal.  Dopplers were negative for DVT at that time.  Continue Unna boots.  Changed 2/2.  CAD  History of coronary stent placement.  No acute issues at this time.  Denies chest pain.  Has remained stable.  Continue aspirin.  Holding Plavix today in case he needs a tunneled catheter.  Essential hypertension  Continue amlodipine, clonidine and metoprolol on as needed hydralazine and IV diuretic.   Hypothyroidism Continue Synthroid   COPD  Compensated at this time.  Continue bronchodilators.   Chronic anemia likely from renal disease, follow CBC.  Hemoglobin is a stable at 9-10.  Debility, deconditioning. Patient lives by himself at home and has a walker and cane at home.  Physical therapy has recommended skilled nursing facility on discharge.  Will refer to SNF once medically stable.  Disposition.  Likely to skilled nursing facility on discharge pending clinical improvement.  DVT prophylaxis: heparin injection 5,000 Units Start: 03/18/21 0600   Code Status: Full code  Family Communication: Daughter on the phone 2/3   Status is: Inpatient   Remains inpatient appropriate because: Worsening CKD, fluid overload, IV diuretics  Consultants: Nephrology  Procedures: None  Anti-infectives:  Rocephin IV 1/24>1/26  Anti-infectives (From admission, onward)    Start     Dose/Rate Route Frequency Ordered Stop   03/18/21 0200  cefTRIAXone (ROCEPHIN) 2 g in sodium chloride 0.9 % 100 mL IVPB  Status:  Discontinued        2 g 200 mL/hr over 30 Minutes Intravenous Every 24 hours 03/18/21 0158 03/20/21 1542      Subjective:  Seen and examined. No overnight events . Foley leaking around and some suprapubic fullness present.  Total  1350 ml urine out put after foley was exchanged.   Vitals:   03/28/21 2300 03/29/21 0514  BP:  (!) 142/62  Pulse:  74  Resp:  16  Temp:  98.4 F (36.9 C)  SpO2: 95% 95%    Intake/Output Summary (Last 24 hours) at 03/29/2021  1140 Last data filed at 03/29/2021 1045 Gross per 24 hour  Intake 120 ml  Output 1350 ml  Net -1230 ml   Filed Weights   03/28/21 0428 03/28/21 0542 03/29/21 0500  Weight: 92.6 kg 94.4 kg 92.5 kg   Body mass index is 26.18 kg/m.   Physical Exam:  General: Looks fairly comfortable and communicative.  On 2-3 L oxygen.  Cardiovascular: Bilateral clear.  S1-S2 normal.  Regular rate rhythm. Respiratory: No added sounds. Gastrointestinal: Soft.  Nontender.  Bowel sounds present.   Ext: Both legs on Unna boot, no swelling on the dorsum of the foot. Neuro: Alert and awake. Moves all extremities. Foley with clear urine.  Data Review: I have personally reviewed the following laboratory data and studies,  CBC: Recent Labs  Lab 03/23/21 0354 03/24/21 0354 03/25/21 0347 03/26/21 0403  WBC 6.3 7.1 6.5 6.3  HGB 9.9* 9.9* 10.2* 9.2*  HCT 32.6* 31.4* 32.2* 29.3*  MCV 95.9 94.0 93.1 94.8  PLT 238 248 251 017   Basic Metabolic Panel: Recent Labs  Lab 03/23/21 0354 03/24/21 0354 03/25/21 0347 03/26/21 0403 03/27/21 0714 03/28/21 0405 03/29/21 1002  NA 137 138 135 136 137 138 135  K 5.2* 5.0 5.1 4.7 4.6 4.5 4.7  CL 110 109 108 106 107 109 106  CO2 18* 19* 17* 18* 18* 19* 17*  GLUCOSE 82 86 77 82 81 88 114*  BUN 71* 74* 74* 77* 77* 79* 83*  CREATININE 5.33* 5.31* 5.55* 5.78* 6.30* 6.42* 6.95*  CALCIUM 8.4* 8.5* 8.3* 8.3* 8.5* 8.2* 8.0*  MG 2.3 2.3 2.3 2.4  --   --   --   PHOS  --  7.7* 7.1* 7.5*  --  8.0*  --    Liver Function Tests: Recent Labs  Lab 03/24/21 0354 03/25/21 0347 03/26/21 0403 03/28/21 0405  AST 12*  --   --   --   ALT 8  --   --   --   ALKPHOS 67  --   --   --   BILITOT 0.5  --   --   --   PROT 6.2*  --   --   --   ALBUMIN 2.2* 2.2* 2.4* 2.6*   No results for input(s): LIPASE, AMYLASE in the last 168 hours. No results for input(s): AMMONIA in the last 168 hours. Cardiac Enzymes: No results for input(s): CKTOTAL, CKMB, CKMBINDEX, TROPONINI in the  last 168 hours.  BNP (last 3 results) Recent Labs    03/07/21 1714 03/08/21 0509 03/17/21 2155  BNP 931.2* 915.5* 280.9*    ProBNP (last 3 results) No results for input(s): PROBNP in the last 8760 hours.  CBG: No results for input(s): GLUCAP in the last 168 hours. No results found for this or any previous visit (from the past 240 hour(s)).     Studies: DG Chest 2 View  Result Date: 03/27/2021 CLINICAL DATA:  Weakness, COPD, former smoker EXAM: CHEST - 2 VIEW COMPARISON:  Chest x-ray 03/09/2021. FINDINGS: The heart and mediastinal contours are unchanged. Aortic calcification. Bibasilar vague airspace opacities. Chronic coarsened insert markings with no overt pulmonary edema. Bilateral at least small volume pleural effusions. Effusion. No  pneumothorax. No acute osseous abnormality. IMPRESSION: 1. Bilateral at least small volume pleural effusions. 2. Bibasilar vague airspace opacities may represent a combination of atelectasis versus infection/inflammation. 3. Aortic Atherosclerosis (ICD10-I70.0) and Emphysema (ICD10-J43.9). Electronically Signed   By: Iven Finn M.D.   On: 03/27/2021 15:05    Total time spent: 30 minutes  Barb Merino, MD  [facility] 03/29/2021  If 7PM-7AM, please contact night-coverage

## 2021-03-29 NOTE — Progress Notes (Signed)
NEW ADMISSION NOTE New Admission Note:   Arrival Method:  [facility] stritcher Transfer from [place] Orientation:  A&O x4 Telemetry: Box 14 Assessment: Completed Skin: see assessemnt IV: Rt AC Pain: 4 0-10 scale Tubes: Arrived with urinary catheter Safety Measures: Safety Fall Prevention Plan has been given, discussed and signed Admission: Completed [facility] Orientation: Patient has been orientated to the room, unit and staff.  Family: no present  Orders have been reviewed and implemented. Will continue to monitor the patient. Call light has been placed within reach and bed alarm has been activated.   Berneta Levins, RN

## 2021-03-29 NOTE — Progress Notes (Addendum)
Report called to [facility] at [phone]. SRP, RN

## 2021-03-29 NOTE — Progress Notes (Signed)
Handoff to on RN, Wille Glaser. SRP, RN

## 2021-03-29 NOTE — Progress Notes (Signed)
[facility] dispatched, family updated of transport. SRP, RN

## 2021-03-29 NOTE — Progress Notes (Signed)
Patient ID: Wayne Meadows, male   DOB: Jun 20, 1934, 87 y.o.   MRN: [id number] [facility] Progress Note   Assessment/ Plan:   1. Acute kidney Injury on chronic kidney disease stage IIIb: Baseline creatinine around 2.0 and data points to progressive worsening renal function likely of cardiorenal mechanism with poor response to diuresis.  Will begin the process to have been transferred to [facility] with the anticipation that he will need dialysis within the next 48 to 72 hours.  Plavix on hold to allow [facility] placement +/- permanent dialysis access.   2.  Hypertension: Blood pressures are intermittently elevated while on clonidine, amlodipine and metoprolol.   3.  Anemia: Without overt blood loss and likely secondary to chronic illness 4.  Non-anion gap metabolic acidosis: Secondary to progressive chronic kidney disease with element of acute kidney injury, will start sodium bicarbonate at this time 5.  Hyperphosphatemia: Secondary to acute kidney injury on chronic kidney disease, begin phosphorus binder with meals.  Subjective:   Denies any chest pain and states that he feels well-denies any dysgeusia/limb jerking movements   Objective:   BP (!) 142/62 (BP Location: Left Arm)    Pulse 74    Temp 98.4 F (36.9 C) (Oral)    Resp 16    Ht 6\' 2"  (1.88 m)    Wt 92.5 kg    SpO2 95%    BMI 26.18 kg/m   Intake/Output Summary (Last 24 hours) at 03/29/2021 1256 Last data filed at 03/29/2021 1045 Gross per 24 hour  Intake 120 ml  Output 1350 ml  Net -1230 ml   Weight change: -0.1 kg  Physical Exam: Gen: Comfortably resting in bed, awakens with some difficulty CVS: Pulse regular rhythm, normal rate, S1 and S2 normal Resp: Anteriorly clear to auscultation bilaterally, no distinct rales or rhonchi Abd: Soft, obese, nontender Ext: Trace-1+ edema over thighs/back, trace edema over legs with Unna boots  Imaging: DG Chest 2 View  Result Date: 03/27/2021 CLINICAL DATA:  Weakness,  COPD, former smoker EXAM: CHEST - 2 VIEW COMPARISON:  Chest x-ray 03/09/2021. FINDINGS: The heart and mediastinal contours are unchanged. Aortic calcification. Bibasilar vague airspace opacities. Chronic coarsened insert markings with no overt pulmonary edema. Bilateral at least small volume pleural effusions. Effusion. No pneumothorax. No acute osseous abnormality. IMPRESSION: 1. Bilateral at least small volume pleural effusions. 2. Bibasilar vague airspace opacities may represent a combination of atelectasis versus infection/inflammation. 3. Aortic Atherosclerosis (ICD10-I70.0) and Emphysema (ICD10-J43.9). Electronically Signed   By: Iven Finn M.D.   On: 03/27/2021 15:05    Labs: BMET Recent Labs  Lab 03/23/21 0354 03/24/21 0354 03/25/21 0347 03/26/21 0403 03/27/21 0714 03/28/21 0405 03/29/21 1002  NA 137 138 135 136 137 138 135  K 5.2* 5.0 5.1 4.7 4.6 4.5 4.7  CL 110 109 108 106 107 109 106  CO2 18* 19* 17* 18* 18* 19* 17*  GLUCOSE 82 86 77 82 81 88 114*  BUN 71* 74* 74* 77* 77* 79* 83*  CREATININE 5.33* 5.31* 5.55* 5.78* 6.30* 6.42* 6.95*  CALCIUM 8.4* 8.5* 8.3* 8.3* 8.5* 8.2* 8.0*  PHOS  --  7.7* 7.1* 7.5*  --  8.0*  --    CBC Recent Labs  Lab 03/23/21 0354 03/24/21 0354 03/25/21 0347 03/26/21 0403  WBC 6.3 7.1 6.5 6.3  HGB 9.9* 9.9* 10.2* 9.2*  HCT 32.6* 31.4* 32.2* 29.3*  MCV 95.9 94.0 93.1 94.8  PLT 238 248 251 232    Medications:  amLODipine  10 mg Oral Daily   aspirin EC  81 mg Oral Daily   Chlorhexidine Gluconate Cloth  6 each Topical Daily   cloNIDine  0.1 mg Oral Daily   docusate sodium  100 mg Oral BID   ferrous sulfate  325 mg Oral BID WC   heparin  5,000 Units Subcutaneous Q8H   hydrocerin   Topical Once per day on Mon Thu   lanthanum  1,000 mg Oral TID WC   levothyroxine  50 mcg Oral Q0600   lidocaine  1 patch Transdermal Q24H   melatonin  3 mg Oral QHS   metoprolol tartrate  25 mg Oral Daily   polyethylene glycol  17 g Oral Daily    pravastatin  40 mg Oral QHS   Elmarie Shiley, MD 03/29/2021, 12:56 PM

## 2021-03-29 NOTE — Plan of Care (Signed)

## 2021-03-29 NOTE — Progress Notes (Signed)
Updated Dtg Lyna Poser of room number at [facility] [address] [facility] 82M 17C. SP, RN

## 2021-03-29 NOTE — Progress Notes (Signed)
[facility] arrived to transport pt to [facility], pt is stable condition. SRP, RN

## 2021-03-29 NOTE — Progress Notes (Signed)
Foley catheter exchanged 54 F per MD request/order. Output 650 ml instantly slightly cloudy amber urine. ABD less distended and soft to touch. SRP,RN

## 2021-03-30 ENCOUNTER — Encounter (HOSPITAL_COMMUNITY): Payer: Self-pay | Admitting: Internal Medicine

## 2021-03-30 DIAGNOSIS — J449 Chronic obstructive pulmonary disease, unspecified: Secondary | ICD-10-CM | POA: Diagnosis not present

## 2021-03-30 DIAGNOSIS — L8990 Pressure ulcer of unspecified site, unspecified stage: Secondary | ICD-10-CM

## 2021-03-30 DIAGNOSIS — J432 Centrilobular emphysema: Secondary | ICD-10-CM | POA: Diagnosis not present

## 2021-03-30 DIAGNOSIS — N179 Acute kidney failure, unspecified: Secondary | ICD-10-CM | POA: Diagnosis not present

## 2021-03-30 HISTORY — DX: Pressure ulcer of unspecified site, unspecified stage: L89.90

## 2021-03-30 LAB — RENAL FUNCTION PANEL
Albumin: 2.1 g/dL — ABNORMAL LOW (ref 3.5–5.0)
Anion gap: 12 (ref 5–15)
BUN: 80 mg/dL — ABNORMAL HIGH (ref 8–23)
CO2: 20 mmol/L — ABNORMAL LOW (ref 22–32)
Calcium: 8 mg/dL — ABNORMAL LOW (ref 8.9–10.3)
Chloride: 106 mmol/L (ref 98–111)
Creatinine, Ser: 7.11 mg/dL — ABNORMAL HIGH (ref 0.61–1.24)
GFR, Estimated: 7 mL/min — ABNORMAL LOW (ref >60)
Glucose, Bld: 97 mg/dL (ref 70–99)
Phosphorus: 6.9 mg/dL — ABNORMAL HIGH (ref 2.5–4.6)
Potassium: 4.6 mmol/L (ref 3.5–5.1)
Sodium: 138 mmol/L (ref 135–145)

## 2021-03-30 MED ORDER — CEFAZOLIN SODIUM-DEXTROSE 2-4 GM/100ML-% IV SOLN: 2.0000 g | INTRAVENOUS | Status: AC

## 2021-03-30 MED ORDER — CHLORHEXIDINE GLUCONATE CLOTH 2 % EX PADS
6.0000 | MEDICATED_PAD | Freq: Every day | CUTANEOUS | Status: DC
  Administered 2021-03-30 – 2021-04-04 (×6): 6 via TOPICAL

## 2021-03-30 NOTE — Progress Notes (Signed)
PROGRESS NOTE  NOAL ABSHIER FVC:[id number] DOB: 08/22/1934 DOA: 03/17/2021 PCP: [name]   LOS: 12 days   Brief narrative: Wayne Meadows is a 87 y.o. male with history of chronic kidney disease stage IIIb, CVA and CAD, diabetes mellitus, COPD, hypertension was recently admitted for cellulitis of the lower extremity, discharged on antibiotic presented to hospital with increasing weakness, difficulty ambulating with increasing redness and swelling of his lower extremities.  In the ED, patient was noted to have weeping of his bilateral lower extremities with erythema.  Creatinine had worsened from 2.9 to 4.8 on presentation.   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  Nephrology was consulted during hospitalization for management of fluid overload and renal failure.  2/5 patient is drowsy.  Denies any shortness of breath, chest pain, abdominal pain.  Did vomit x1.  Nothing at this point.  Assessment/Plan:  Principal Problem:   Acute renal failure (ARF) (HCC) Active Problems:   CAD (coronary artery disease)   COPD (chronic obstructive pulmonary disease) (HCC)   Atrial fibrillation (HCC)   Renal cell cancer (HCC)   Essential hypertension   Centrilobular emphysema (HCC)   AKI (acute kidney injury) ([place])   Hypothyroidism   ARF (acute renal failure) (HCC)   Pressure injury of skin   Acute on chronic kidney disease stage IV with non-anion gap metabolic acidosis: Followed by nephrology. Currently on conservative management. On intermittent albumin and Lasix trial.  Urine output 1000 mL last 24 hours. Renal function continues to worsen. Renal ultrasound with echogenic kidneys consistent with CKD.   No uremic symptoms but again no meaningful recovery of renal functions.   Further management as per nephrology.   Patient is potentially needing hemodialysis, will hold  Plavix for now. Foley catheter exchanged with good outcome. 2/5 plan to proceed with HD catheter placement tomorrow We will need vein mapping/vascular surgery consultation Process initiated for outpatient dialysis unit placement tomorrow IV Lasix   Gross bilateral lower extremity edema anasarca/venous insufficiency.  Patient did have a recent ABI which was normal.  Dopplers were negative for DVT at that time.  Continue Unna boots.  Changed 2/2. 2/5 plan for catheter placement for HD Continue IV Lasix   CAD  History of coronary stent placement. Chest pain-free Continue aspirin Holding Plavix for IR procedure above    Essential hypertension  Overall BP stable with fluctuance of elevation  Continue clonidine, amlodipine and metoprolol      Hypothyroidism Continue Synthroid   COPD  Compensated at this time.  Continue bronchodilators.   Chronic anemia likely from renal disease, follow CBC.  Hemoglobin is a stable at 9-10.  Debility, deconditioning. Patient lives by himself at home and has a walker and cane at home.  Physical therapy has recommended skilled nursing facility on discharge.  Will refer to SNF once medically stable.  Disposition.  Likely to skilled nursing facility on discharge pending clinical improvement.  DVT prophylaxis: heparin injection 5,000 Units Start: 03/18/21 0600   Code Status: Full code  Family Communication: None at bedside   Status is: Inpatient   Remains inpatient appropriate because: Worsening CKD, fluid overload, IV diuretics, needs HD catheter placement, vascular surgery consulted  Consultants: Nephrology IR  Procedures: None  Anti-infectives:  Rocephin IV 1/24>1/26  Anti-infectives (From admission, onward)    Start     Dose/Rate Route Frequency Ordered Stop   03/31/21 0600  ceFAZolin (  ANCEF) IVPB 2g/100 mL premix        2 g 200 mL/hr over 30 Minutes Intravenous On call 03/30/21 1118 04/01/21 0600   03/18/21 0200   cefTRIAXone (ROCEPHIN) 2 g in sodium chloride 0.9 % 100 mL IVPB  Status:  Discontinued        2 g 200 mL/hr over 30 Minutes Intravenous Every 24 hours 03/18/21 0158 03/20/21 1542      Subjective:  Seen and examined. No overnight events . Foley leaking around and some suprapubic fullness present.  Total 1350 ml urine out put after foley was exchanged.   Vitals:   03/29/21 2351 03/30/21 0327  BP: 140/71 (!) 157/64  Pulse: 70 73  Resp: 20 19  Temp: 98.3 F (36.8 C) 97.9 F (36.6 C)  SpO2: 98% 94%    Intake/Output Summary (Last 24 hours) at 03/30/2021 1147 Last data filed at 03/29/2021 2112 Gross per 24 hour  Intake 360 ml  Output 400 ml  Net -40 ml   Filed Weights   03/28/21 0542 03/29/21 0500 03/30/21 0650  Weight: 94.4 kg 92.5 kg 92.3 kg   Body mass index is 26.13 kg/m.   Physical Exam:  Calm, NAD Cta no w/r Reg s1/s2 no gallop Soft benign +bs +edema b/l Drawsy but oriented x3 Mood and affect appropriate in current setting   Data Review: I have personally reviewed the following laboratory data and studies,  CBC: Recent Labs  Lab 03/24/21 0354 03/25/21 0347 03/26/21 0403  WBC 7.1 6.5 6.3  HGB 9.9* 10.2* 9.2*  HCT 31.4* 32.2* 29.3*  MCV 94.0 93.1 94.8  PLT 248 251 144   Basic Metabolic Panel: Recent Labs  Lab 03/24/21 0354 03/25/21 0347 03/26/21 0403 03/27/21 0714 03/28/21 0405 03/29/21 1002  NA 138 135 136 137 138 135  K 5.0 5.1 4.7 4.6 4.5 4.7  CL 109 108 106 107 109 106  CO2 19* 17* 18* 18* 19* 17*  GLUCOSE 86 77 82 81 88 114*  BUN 74* 74* 77* 77* 79* 83*  CREATININE 5.31* 5.55* 5.78* 6.30* 6.42* 6.95*  CALCIUM 8.5* 8.3* 8.3* 8.5* 8.2* 8.0*  MG 2.3 2.3 2.4  --   --   --   PHOS 7.7* 7.1* 7.5*  --  8.0*  --    Liver Function Tests: Recent Labs  Lab 03/24/21 0354 03/25/21 0347 03/26/21 0403 03/28/21 0405  AST 12*  --   --   --   ALT 8  --   --   --   ALKPHOS 67  --   --   --   BILITOT 0.5  --   --   --   PROT 6.2*  --   --   --    ALBUMIN 2.2* 2.2* 2.4* 2.6*   No results for input(s): LIPASE, AMYLASE in the last 168 hours. No results for input(s): AMMONIA in the last 168 hours. Cardiac Enzymes: No results for input(s): CKTOTAL, CKMB, CKMBINDEX, TROPONINI in the last 168 hours.  BNP (last 3 results) Recent Labs    03/07/21 1714 03/08/21 0509 03/17/21 2155  BNP 931.2* 915.5* 280.9*    ProBNP (last 3 results) No results for input(s): PROBNP in the last 8760 hours.  CBG: No results for input(s): GLUCAP in the last 168 hours. No results found for this or any previous visit (from the past 240 hour(s)).     Studies: No results found.  Total time spent: 35 minutes  Nolberto Hanlon, MD  [facility] 03/30/2021  If 7PM-7AM, please contact night-coverage

## 2021-03-30 NOTE — Progress Notes (Signed)
Patient ID: Wayne Meadows, male   DOB: Jan 13, 1935, 87 y.o.   MRN: [id number] [facility] Progress Note   Assessment/ Plan:   1. Acute kidney Injury on chronic kidney disease stage IIIb: Baseline creatinine around 2.0 and data points to progressive worsening renal function likely of cardiorenal mechanism with poor response to diuresis.  I will consult interventional radiology to place him on schedule for tomorrow for placement of tunneled hemodialysis catheter and he will need vein mapping/vascular surgery consultation and the process initiated for outpatient dialysis unit placement tomorrow.  Plavix on hold to allow [facility] placement +/- permanent dialysis access.   2.  Hypertension: Blood pressures are intermittently elevated while on clonidine, amlodipine and metoprolol.  Monitor with hemodialysis/volume unloading. 3.  Anemia: Without overt blood loss and likely secondary to chronic illness.  Iron stores replete, begin ESA with dialysis. 4.  Non-anion gap metabolic acidosis: Secondary to progressive chronic kidney disease with element of acute kidney injury, continue sodium bicarbonate at this time and stop once on dialysis. 5.  Hyperphosphatemia: Secondary to acute kidney injury on chronic kidney disease, continue lanthanum for phosphorus binding and monitor with hemodialysis/renal diet.  Subjective:   Denies acute events overnight, specifically denies any chest pain or shortness of breath.  States that he is sleepy because of nothing better to do in the hospital.   Objective:   BP (!) 157/64 (BP Location: Left Arm)    Pulse 73    Temp 97.9 F (36.6 C) (Oral)    Resp 19    Ht 6\' 2"  (1.88 m)    Wt 92.3 kg    SpO2 94%    BMI 26.13 kg/m   Intake/Output Summary (Last 24 hours) at 03/30/2021 3810 Last data filed at 03/29/2021 2112 Gross per 24 hour  Intake 360 ml  Output 1400 ml  Net -1040 ml   Weight change: -0.2 kg  Physical Exam: Gen: Comfortably resting in bed, awakens to  calling his name and answers questions appropriately CVS: Pulse regular rhythm, normal rate, S1 and S2 normal Resp: Anteriorly clear to auscultation bilaterally, no distinct rales or rhonchi Abd: Soft, obese, nontender Ext: Trace-1+ edema over thighs/back, trace edema over legs with Unna boots  Imaging: No results found.  Labs: BMET Recent Labs  Lab 03/24/21 0354 03/25/21 0347 03/26/21 0403 03/27/21 0714 03/28/21 0405 03/29/21 1002  NA 138 135 136 137 138 135  K 5.0 5.1 4.7 4.6 4.5 4.7  CL 109 108 106 107 109 106  CO2 19* 17* 18* 18* 19* 17*  GLUCOSE 86 77 82 81 88 114*  BUN 74* 74* 77* 77* 79* 83*  CREATININE 5.31* 5.55* 5.78* 6.30* 6.42* 6.95*  CALCIUM 8.5* 8.3* 8.3* 8.5* 8.2* 8.0*  PHOS 7.7* 7.1* 7.5*  --  8.0*  --    CBC Recent Labs  Lab 03/24/21 0354 03/25/21 0347 03/26/21 0403  WBC 7.1 6.5 6.3  HGB 9.9* 10.2* 9.2*  HCT 31.4* 32.2* 29.3*  MCV 94.0 93.1 94.8  PLT 248 251 232    Medications:     amLODipine  10 mg Oral Daily   aspirin EC  81 mg Oral Daily   Chlorhexidine Gluconate Cloth  6 each Topical Daily   Chlorhexidine Gluconate Cloth  6 each Topical Q0600   cloNIDine  0.1 mg Oral Daily   docusate sodium  100 mg Oral BID   ferrous sulfate  325 mg Oral BID WC   furosemide  80 mg Intravenous TID   heparin  5,000 Units Subcutaneous Q8H   hydrocerin   Topical Once per day on Mon Thu   lanthanum  1,000 mg Oral TID WC   levothyroxine  50 mcg Oral Q0600   lidocaine  1 patch Transdermal Q24H   melatonin  3 mg Oral QHS   metoprolol tartrate  25 mg Oral Daily   polyethylene glycol  17 g Oral Daily   pravastatin  40 mg Oral QHS   sodium bicarbonate  650 mg Oral TID   Elmarie Shiley, MD 03/30/2021, 8:43 AM

## 2021-03-30 NOTE — Progress Notes (Signed)
Chief Complaint: Patient was seen in consultation today for dialysis catheter   Referring Physician(s): Dr. Elmarie Shiley  Supervising Physician: Sandi Mariscal  Patient Status: [facility] - In-pt  History of Present Illness: Wayne Meadows is a 87 y.o. male with progressive CKD now in need of HD. IR is asked to place tunneled HD catheter. PMHx, meds, labs, imaging, allergies reviewed. Normally on Plavix for hx of CAD and CVA but has been held a couple days. Feels okay other than a little depressed about starting dialysis.    Past Medical History:  Diagnosis Date   Arthritis    Carotid stenosis    COPD (chronic obstructive pulmonary disease) ([place])    Coronary artery disease    S/p PCI 2011;  NSTEMI 12/12:  LHC/PCI 02/23/11: LAD 60% after the septal perforator, D1 occluded with distal collaterals, proximal RI 30-40%, AV circumflex stent patent with 60% stenosis after the stent, RCA 99%, EF 60-65%.  His RCA was treated with a bare-metal stent   CVA (cerebral infarction) 2011   Right cerebral; total obstruction of the right ICA   Diverticulitis    Hypertension    Rectal bleeding 07/2015   Renal carcinoma (HCC)    Stroke ([place])    Tobacco abuse, in remission     Past Surgical History:  Procedure Laterality Date   COLON SURGERY     COLONOSCOPY N/A 08/22/2015   Procedure: COLONOSCOPY;  Surgeon: [name], MD;  Location: [facility] ENDOSCOPY;  Service: Endoscopy;  Laterality: N/A;   ESOPHAGOGASTRODUODENOSCOPY N/A 07/18/2020   Procedure: ESOPHAGOGASTRODUODENOSCOPY (EGD);  Surgeon: Milus Banister, MD;  Location: [facility];  Service: Endoscopy;  Laterality: N/A;   ESOPHAGOGASTRODUODENOSCOPY (EGD) WITH PROPOFOL N/A 06/21/2020   Procedure: ESOPHAGOGASTRODUODENOSCOPY (EGD) WITH PROPOFOL;  Surgeon: Irene Shipper, MD;  Location: [facility] ENDOSCOPY;  Service: Endoscopy;  Laterality: N/A;   EUS N/A 07/18/2020   Procedure: UPPER ENDOSCOPIC ULTRASOUND (EUS) RADIAL;  Surgeon: Milus Banister, MD;   Location: [facility] ENDOSCOPY;  Service: Endoscopy;  Laterality: N/A;   FINE NEEDLE ASPIRATION N/A 07/18/2020   Procedure: FINE NEEDLE ASPIRATION (FNA) LINEAR;  Surgeon: Milus Banister, MD;  Location: [facility] ENDOSCOPY;  Service: Endoscopy;  Laterality: N/A;   hip relacement     KIDNEY SURGERY     LEFT HEART CATH AND CORONARY ANGIOGRAPHY N/A 07/09/2020   Procedure: LEFT HEART CATH AND CORONARY ANGIOGRAPHY;  Surgeon: Leonie Man, MD;  Location: [facility] CV LAB;  Service: Cardiovascular;  Laterality: N/A;   LEFT HEART CATHETERIZATION WITH CORONARY ANGIOGRAM N/A 02/23/2011   Procedure: LEFT HEART CATHETERIZATION WITH CORONARY ANGIOGRAM;  Surgeon: [name], MD;  Location: [facility] CATH LAB;  Service: Cardiovascular;  Laterality: N/A;   LEFT HEART CATHETERIZATION WITH CORONARY ANGIOGRAM N/A 03/18/2011   Procedure: LEFT HEART CATHETERIZATION WITH CORONARY ANGIOGRAM;  Surgeon: [name], MD;  Location: [facility] CATH LAB;  Service: Cardiovascular;  Laterality: N/A;   PERCUTANEOUS CORONARY STENT INTERVENTION (PCI-S) N/A 02/23/2011   Procedure: PERCUTANEOUS CORONARY STENT INTERVENTION (PCI-S);  Surgeon: [name], MD;  Location: [facility] CATH LAB;  Service: Cardiovascular;  Laterality: N/A;   TEMPORARY PACEMAKER INSERTION N/A 02/23/2011   Procedure: TEMPORARY PACEMAKER INSERTION;  Surgeon: [name], MD;  Location: [facility] CATH LAB;  Service: Cardiovascular;  Laterality: N/A;    Allergies: Bee venom and Influenza vaccines  Medications:  Current Facility-Administered Medications:    acetaminophen (TYLENOL) tablet 650 mg, 650 mg, Oral, Q6H PRN, 650 mg at 03/28/21 0236 **OR** acetaminophen (TYLENOL) suppository  650 mg, 650 mg, Rectal, Q6H PRN, Rise Patience, MD   albuterol (PROVENTIL) (2.5 MG/3ML) 0.083% nebulizer solution 2.5 mg, 2.5 mg, Inhalation, Q6H PRN, Pokhrel, Laxman, MD, 2.5 mg at 03/24/21 1734   amLODipine (NORVASC) tablet 10 mg, 10 mg, Oral, Daily, Rise Patience, MD, 10 mg at 03/30/21  5993   aspirin EC tablet 81 mg, 81 mg, Oral, Daily, Rise Patience, MD, 81 mg at 03/30/21 5701   Chlorhexidine Gluconate Cloth 2 % PADS 6 each, 6 each, Topical, Q0600, Elmarie Shiley, MD, 6 each at 03/30/21 [phone]   cloNIDine (CATAPRES) tablet 0.1 mg, 0.1 mg, Oral, Daily, Rise Patience, MD, 0.1 mg at 03/30/21 9030   docusate sodium (COLACE) capsule 100 mg, 100 mg, Oral, BID, Pokhrel, Laxman, MD, 100 mg at 03/30/21 0923   ferrous sulfate tablet 325 mg, 325 mg, Oral, BID WC, Rise Patience, MD, 325 mg at 03/29/21 1810   furosemide (LASIX) injection 80 mg, 80 mg, Intravenous, TID, Elmarie Shiley, MD, 80 mg at 03/30/21 0851   heparin injection 5,000 Units, 5,000 Units, Subcutaneous, Q8H, Rise Patience, MD, 5,000 Units at 03/30/21 3007   hydrALAZINE (APRESOLINE) tablet 10 mg, 10 mg, Oral, Q8H PRN, Blount, Xenia T, NP, 10 mg at 03/24/21 2124   hydrocerin (EUCERIN) cream, , Topical, Once per day on Mon Thu, Louanne Belton, Corrie Mckusick, MD, Given at 03/27/21 1046   HYDROcodone-acetaminophen (NORCO) 10-325 MG per tablet 1 tablet, 1 tablet, Oral, BID PRN, Pokhrel, Laxman, MD, 1 tablet at 03/30/21 0128   hydrocortisone cream 1 %, , Topical, BID PRN, Pokhrel, Laxman, MD, Given at 03/29/21 1029   lanthanum (FOSRENOL) chewable tablet 1,000 mg, 1,000 mg, Oral, TID WC, Elmarie Shiley, MD, 1,000 mg at 03/30/21 6226   levothyroxine (SYNTHROID) tablet 50 mcg, 50 mcg, Oral, Q0600, Rise Patience, MD, 50 mcg at 03/30/21 0529   lidocaine (LIDODERM) 5 % 1 patch, 1 patch, Transdermal, Q24H, Pokhrel, Laxman, MD, 1 patch at 03/30/21 0852   melatonin tablet 3 mg, 3 mg, Oral, QHS, Kathryne Eriksson, NP, 3 mg at 03/29/21 2146   metoprolol tartrate (LOPRESSOR) tablet 25 mg, 25 mg, Oral, Daily, Rise Patience, MD, 25 mg at 03/30/21 0852   ondansetron (ZOFRAN) injection 4 mg, 4 mg, Intravenous, Q6H PRN, Pokhrel, Laxman, MD, 4 mg at 03/27/21 1211   polyethylene glycol (MIRALAX / GLYCOLAX) packet 17 g, 17 g, Oral, Daily,  Pokhrel, Laxman, MD, 17 g at 03/27/21 0934   pravastatin (PRAVACHOL) tablet 40 mg, 40 mg, Oral, QHS, Rise Patience, MD, 40 mg at 03/29/21 2146   promethazine (PHENERGAN) 12.5 mg in sodium chloride 0.9 % 50 mL IVPB, 12.5 mg, Intravenous, Q8H PRN, Barb Merino, MD, Last Rate: 200 mL/hr at 03/27/21 1740, 12.5 mg at 03/27/21 1740   simethicone (MYLICON) 40 JF/3.5KT suspension 40 mg, 40 mg, Oral, QID PRN, Pokhrel, Laxman, MD   sodium bicarbonate tablet 650 mg, 650 mg, Oral, TID, Elmarie Shiley, MD, 650 mg at 03/30/21 6256    Family History  Problem Relation Age of Onset   Heart attack Other 20    Social History   Socioeconomic History   Marital status: Divorced    Spouse name: Not on file   Number of children: Not on file   Years of education: Not on file   Highest education level: Not on file  Occupational History   Not on file  Tobacco Use   Smoking status: Former    Types: Cigarettes    Quit  date: 02/24/2007    Years since quitting: 14.1   Smokeless tobacco: Never  Vaping Use   Vaping Use: Never used  Substance and Sexual Activity   Alcohol use: Not Currently    Alcohol/week: 1.0 standard drink    Types: 1 Standard drinks or equivalent per week    Comment: socially    Drug use: No   Sexual activity: Not on file  Other Topics Concern   Not on file  Social History Narrative   Not on file   Social Determinants of Health   Financial Resource Strain: Not on file  Food Insecurity: Not on file  Transportation Needs: Not on file  Physical Activity: Not on file  Stress: Not on file  Social Connections: Not on file    Review of Systems: A 12 point ROS discussed and pertinent positives are indicated in the HPI above.  All other systems are negative.  Review of Systems  Vital Signs: BP (!) 157/64 (BP Location: Left Arm)    Pulse 73    Temp 97.9 F (36.6 C) (Oral)    Resp 19    Ht 6\' 2"  (1.88 m)    Wt 92.3 kg    SpO2 94%    BMI 26.13 kg/m   Physical  Exam Constitutional:      Appearance: Normal appearance.  HENT:     Mouth/Throat:     Mouth: Mucous membranes are moist.     Pharynx: Oropharynx is clear.  Cardiovascular:     Rate and Rhythm: Normal rate and regular rhythm.     Heart sounds: Normal heart sounds.  Pulmonary:     Effort: Pulmonary effort is normal. No respiratory distress.     Breath sounds: Normal breath sounds.  Musculoskeletal:     Cervical back: Normal range of motion and neck supple.  Skin:    General: Skin is warm and dry.  Neurological:     General: No focal deficit present.     Mental Status: He is alert and oriented to person, place, and time.  Psychiatric:        Mood and Affect: Mood normal.        Thought Content: Thought content normal.        Judgment: Judgment normal.    Imaging: DG Chest 2 View  Result Date: 03/27/2021 CLINICAL DATA:  Weakness, COPD, former smoker EXAM: CHEST - 2 VIEW COMPARISON:  Chest x-ray 03/09/2021. FINDINGS: The heart and mediastinal contours are unchanged. Aortic calcification. Bibasilar vague airspace opacities. Chronic coarsened insert markings with no overt pulmonary edema. Bilateral at least small volume pleural effusions. Effusion. No pneumothorax. No acute osseous abnormality. IMPRESSION: 1. Bilateral at least small volume pleural effusions. 2. Bibasilar vague airspace opacities may represent a combination of atelectasis versus infection/inflammation. 3. Aortic Atherosclerosis (ICD10-I70.0) and Emphysema (ICD10-J43.9). Electronically Signed   By: Iven Finn M.D.   On: 03/27/2021 15:05   DG Chest 2 View  Result Date: 03/04/2021 CLINICAL DATA:  Productive cough EXAM: CHEST - 2 VIEW COMPARISON:  January 01, 2021. FINDINGS: Similar right basilar atelectasis. Suspected trace bilateral pleural effusions. No consolidation. No visible pleural effusions or pneumothorax. Incompletely imaged lumbar fusion hardware. IMPRESSION: 1. Similar right basilar atelectasis. 2. Suspected  trace bilateral pleural effusions. 3. Chronic hyperinflation. Electronically Signed   By: Margaretha Sheffield M.D.   On: 03/04/2021 13:11   DG Abd 1 View  Result Date: 03/18/2021 CLINICAL DATA:  Increased weakness and distension EXAM: ABDOMEN - 1 VIEW COMPARISON:  10/26/2011  KUB, CT abdomen 08/12/2020 FINDINGS: Gaseous distension of the small bowel and colon. No evidence of pneumoperitoneum, portal venous gas or pneumatosis. No pathologic calcifications along the expected course of the ureters. No acute osseous abnormality. Posterior spinal fusion from L2 through S1. Right hip arthroplasty. IMPRESSION: Gaseous distension of the small bowel and colon which may reflect an ileus versus partial bowel obstruction. Electronically Signed   By: Kathreen Devoid M.D.   On: 03/18/2021 06:35   NM Pulmonary Perfusion  Result Date: 03/09/2021 CLINICAL DATA:  Positive D-dimer.  History of COPD. EXAM: NUCLEAR MEDICINE PERFUSION LUNG SCAN TECHNIQUE: Perfusion images were obtained in multiple projections after intravenous injection of radiopharmaceutical. Ventilation scans intentionally deferred if perfusion scan and chest x-ray adequate for interpretation during COVID 19 epidemic. RADIOPHARMACEUTICALS:  4.4 mCi Tc-33m MAA IV COMPARISON:  03/07/2021.  Current chest radiograph is pending. FINDINGS: There are no segmental perfusion defects to suggest pulmonary thromboembolism. Nonsegmental heterogeneous areas of relative decreased perfusion are noted, mostly in the upper lobes, consistent with emphysema. IMPRESSION: No evidence of a pulmonary embolism. Electronically Signed   By: Lajean Manes M.D.   On: 03/09/2021 14:42   US RENAL  Result Date: 03/18/2021 CLINICAL DATA:  Acute renal failure. EXAM: RENAL / URINARY TRACT ULTRASOUND COMPLETE COMPARISON:  Renal ultrasound dated 03/05/2021. FINDINGS: Right Kidney: Renal measurements: 11.4 x 5.9 x 6.3 cm = volume: 224 mL. Moderate parenchyma atrophy. There is diffuse increased renal  echogenicity. No hydronephrosis or shadowing stone. Several cysts measure up to 4 cm in the upper pole. Left Kidney: Renal measurements: 9.6 x 5.8 x 5.7 cm = volume: 165 mL. Moderate parenchyma atrophy. Increased echogenicity. No hydronephrosis or shadowing stone. Several cysts measure up to 3.5 cm. Bladder: Appears normal for degree of bladder distention. Other: None. IMPRESSION: 1. Atrophic and echogenic kidneys in keeping with chronic kidney disease. No hydronephrosis or shadowing stone. 2. Bilateral renal cysts. Electronically Signed   By: Anner Crete M.D.   On: 03/18/2021 03:18   US RENAL  Result Date: 03/05/2021 CLINICAL DATA:  Acute kidney injury, history of left kidney biopsy and partial left nephrectomy. EXAM: RENAL / URINARY TRACT ULTRASOUND COMPLETE COMPARISON:  No prior renal ultrasound, correlation is made with abdomen ultrasound 03/16/2008 and CT abdomen pelvis 08/12/2020 FINDINGS: Right Kidney: Renal measurements: 10.5 x 5.9 x 6.8 cm = volume: 221 mL. Echogenicity is increased. No hydronephrosis visualized. Multiple anechoic lesions are seen, compatible with renal cysts, the largest of which measures up to 4.2 x 4.0 x 3.7 cm. Left Kidney: Renal measurements: 10.2 x 5.9 x 5.5 cm = volume: 174 mL. Echogenicity is increased. No hydronephrosis visualized. Multiple anechoic lesions are seen, compatible with renal cysts, the largest of which measures up to 3.2 x 2.5 x 2.8 cm Bladder: Appears normal for degree of bladder distention. Other: None. IMPRESSION: 1. Increased renal echogenicity bilaterally, compatible with medical renal disease. 2. Multiple anechoic lesions in the bilateral kidneys, compatible with renal cysts. Electronically Signed   By: Merilyn Baba M.D.   On: 03/05/2021 12:41   DG CHEST PORT 1 VIEW  Result Date: 03/09/2021 CLINICAL DATA:  Cough EXAM: PORTABLE CHEST 1 VIEW COMPARISON:  03/07/2021 FINDINGS: Two frontal views of the chest demonstrate a stable cardiac silhouette. The  lungs are hyperinflated with background interstitial prominence consistent with emphysema. No acute airspace disease, effusion, or pneumothorax. No acute bony abnormalities. IMPRESSION: 1. Stable emphysema.  No acute process. Electronically Signed   By: Randa Ngo M.D.   On: 03/09/2021  16:06   DG CHEST PORT 1 VIEW  Result Date: 03/07/2021 CLINICAL DATA:  Cough EXAM: PORTABLE CHEST 1 VIEW COMPARISON:  03/04/2021 FINDINGS: Single frontal view of the chest demonstrates a stable cardiac silhouette. Lungs are hyperinflated with chronic bilateral scarring unchanged. No acute airspace disease, effusion, or pneumothorax. No acute bony abnormality. IMPRESSION: 1. Emphysema, no acute intrathoracic process. Electronically Signed   By: Randa Ngo M.D.   On: 03/07/2021 15:01   VAS [place] ABI WITH/WO TBI  Result Date: 03/06/2021  LOWER EXTREMITY DOPPLER STUDY Patient Name:  Wayne Meadows  Date of Exam:   03/06/2021 Medical Rec #: [id number]         Accession #:    [id number] Date of Birth: Apr 07, 1934        Patient Gender: M Patient Age:   63 years Exam [facility]  [facility] Procedure:      VAS [place] ABI WITH/WO TBI Referring Phys: A POWELL JR --------------------------------------------------------------------------------  Indications: Cellulitis. High Risk Factors: Hypertension, coronary artery disease.  Comparison Study: No prior studies. Performing Technologist: Carlos Levering RVT  Examination Guidelines: A complete evaluation includes at minimum, Doppler waveform signals and systolic blood pressure reading at the level of bilateral brachial, anterior tibial, and posterior tibial arteries, when vessel segments are accessible. Bilateral testing is considered an integral part of a complete examination. Photoelectric Plethysmograph (PPG) waveforms and toe systolic pressure readings are included as required and additional duplex testing as needed. Limited examinations for reoccurring indications may be  performed as noted.  ABI Findings: +---------+------------------+-----+--------+--------+  Right     Rt Pressure (mmHg) Index Waveform Comment   +---------+------------------+-----+--------+--------+  Brachial  173                                         +---------+------------------+-----+--------+--------+  PTA       230                1.26  biphasic           +---------+------------------+-----+--------+--------+  DP        219                1.20  biphasic           +---------+------------------+-----+--------+--------+  Great Toe 85                 0.46                     +---------+------------------+-----+--------+--------+ +---------+------------------+-----+---------+-------+  Left      Lt Pressure (mmHg) Index Waveform  Comment  +---------+------------------+-----+---------+-------+  Brachial  183                      triphasic          +---------+------------------+-----+---------+-------+  PTA       189                1.03  biphasic           +---------+------------------+-----+---------+-------+  DP        186                1.02  biphasic           +---------+------------------+-----+---------+-------+  Great Toe 105                0.57                     +---------+------------------+-----+---------+-------+ +-------+-----------+-----------+------------+------------+  ABI/TBI Today's ABI Today's TBI Previous ABI Previous TBI  +-------+-----------+-----------+------------+------------+  Right   1.26                                               +-------+-----------+-----------+------------+------------+  Left    1.03                                               +-------+-----------+-----------+------------+------------+  Summary: Right: Resting right ankle-brachial index is within normal range. No evidence of significant right lower extremity arterial disease. The right toe-brachial index is abnormal. Left: Resting left ankle-brachial index is within normal range. No evidence of significant left  lower extremity arterial disease. The left toe-brachial index is abnormal.  *See table(s) above for measurements and observations.  Electronically signed by Orlie Pollen on 03/06/2021 at 3:57:06 PM.    Final    ECHOCARDIOGRAM COMPLETE  Result Date: 03/08/2021    ECHOCARDIOGRAM REPORT   Patient Name:   Wayne Meadows Date of Exam: 03/08/2021 Medical Rec #:  [id number]        Height:       74.0 in Accession #:    [id number]       Weight:       195.0 lb Date of Birth:  1934/11/20       BSA:          2.150 m Patient Age:    79 years         BP:           172/66 mmHg Patient Gender: M                HR:           90 bpm. Exam Location:  Inpatient Procedure: 2D Echo Indications:    "Elevated brain natriuretic peptide level"  History:        Patient has no prior history of Echocardiogram examinations.                 CAD, COPD and Stroke; Risk Factors:Hypertension.  Sonographer:    Arlyss Gandy Referring Phys: [place]  1. Left ventricular ejection fraction, by estimation, is 65 to 70%. The left ventricle has hyperdynamic function. The left ventricle has no regional wall motion abnormalities. There is mild concentric left ventricular hypertrophy. Left ventricular diastolic parameters are consistent with Grade I diastolic dysfunction (impaired relaxation).  2. Right ventricular systolic function is normal. The right ventricular size is normal. There is mildly elevated pulmonary artery systolic pressure.  3. Left atrial size was mildly dilated.  4. 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 can't rule out PFO.  5. Right atrial size was mildly dilated.  6. The mitral valve is normal in structure. No evidence of mitral valve regurgitation.  7. The aortic valve was not well visualized. Aortic valve regurgitation is mild to moderate.  8. The inferior vena cava is normal in size with greater than 50% respiratory variability, suggesting right atrial pressure of 3 mmHg.  Comparison(s): No prior Echocardiogram. FINDINGS  Left Ventricle: Left ventricular ejection fraction, by estimation, is 65 to 70%. The left ventricle has hyperdynamic function. The left ventricle has no regional wall  motion abnormalities. The left ventricular internal cavity size was normal in size. There is mild concentric left ventricular hypertrophy. Left ventricular diastolic parameters are consistent with Grade I diastolic dysfunction (impaired relaxation). Right Ventricle: The right ventricular size is normal. No increase in right ventricular wall thickness. Right ventricular systolic function is normal. There is mildly elevated pulmonary artery systolic pressure. The tricuspid regurgitant velocity is 2.96  m/s, and with an assumed right atrial pressure of 3 mmHg, the estimated right ventricular systolic pressure is 74.0 mmHg. Left Atrium: Left atrial size was mildly dilated. Right Atrium: Right atrial size was mildly dilated. Pericardium: There is no evidence of pericardial effusion. Mitral Valve: The mitral valve is normal in structure. No evidence of mitral valve regurgitation. Tricuspid Valve: The tricuspid valve is normal in structure. Tricuspid valve regurgitation is mild. Aortic Valve: The aortic valve was not well visualized. Aortic valve regurgitation is mild to moderate. Aortic valve mean gradient measures 8.0 mmHg. Aortic valve peak gradient measures 12.8 mmHg. Aortic valve area, by VTI measures 2.49 cm. Pulmonic Valve: The pulmonic valve was not well visualized. Aorta: The aortic root and ascending aorta are structurally normal, with no evidence of dilitation. Venous: The inferior vena cava is normal in size with greater than 50% respiratory variability, suggesting right atrial pressure of 3 mmHg. IAS/Shunts: Can't rule out PFO.  LEFT VENTRICLE PLAX 2D LVIDd:         4.40 cm   Diastology LVIDs:         3.00 cm   LV e' medial:    7.51 cm/s LV PW:         1.10 cm   LV E/e' medial:  11.0 LV IVS:         1.20 cm   LV e' lateral:   8.81 cm/s LVOT diam:     2.20 cm   LV E/e' lateral: 9.4 LV SV:         88 LV SV Index:   41 LVOT Area:     3.80 cm  RIGHT VENTRICLE             IVC RV Basal diam:  4.00 cm     IVC diam: 2.30 cm RV S prime:     21.10 cm/s TAPSE (M-mode): 2.3 cm LEFT ATRIUM             Index        RIGHT ATRIUM           Index LA diam:        4.70 cm 2.19 cm/m   RA Area:     19.80 cm LA Vol (A2C):   94.9 ml 44.14 ml/m  RA Volume:   55.60 ml  25.86 ml/m LA Vol (A4C):   68.2 ml 31.72 ml/m LA Biplane Vol: 83.0 ml 38.60 ml/m  AORTIC VALVE AV Area (Vmax):    2.59 cm AV Area (Vmean):   2.39 cm AV Area (VTI):     2.49 cm AV Vmax:           179.00 cm/s AV Vmean:          134.000 cm/s AV VTI:            0.353 m AV Peak Grad:      12.8 mmHg AV Mean Grad:      8.0 mmHg LVOT Vmax:         122.00 cm/s LVOT Vmean:        84.400 cm/s LVOT VTI:  0.231 m LVOT/AV VTI ratio: 0.65  AORTA Ao Root diam: 3.00 cm Ao Asc diam:  2.60 cm MITRAL VALVE               TRICUSPID VALVE MV Area (PHT): 2.71 cm    TR Peak grad:   35.0 mmHg MV Decel Time: 280 msec    TR Vmax:        296.00 cm/s MV E velocity: 82.70 cm/s MV A velocity: 97.70 cm/s  SHUNTS MV E/A ratio:  0.85        Systemic VTI:  0.23 m                            Systemic Diam: 2.20 cm Phineas Inches Electronically signed by Phineas Inches Signature Date/Time: 03/08/2021/1:31:12 PM    Final    VAS [place] LOWER EXTREMITY VENOUS (DVT)  Result Date: 03/08/2021  Lower Venous DVT Study Patient Name:  Wayne Meadows  Date of Exam:   03/08/2021 Medical Rec #: [id number]         Accession #:    [id number] Date of Birth: 09/04/1934        Patient Gender: M Patient Age:   52 years Exam Location:  [facility] Procedure:      VAS [place] LOWER EXTREMITY VENOUS (DVT) Referring Phys: A POWELL JR --------------------------------------------------------------------------------  Indications: Elevated d-dimer, cellulitis, suspected PE.  Anticoagulation: Heparin. Limitations:  Bilateral bandaging of mid-lower calves. Comparison Study: No prior studies. Performing Technologist: Darlin Coco RDMS, RVT  Examination Guidelines: A complete evaluation includes B-mode imaging, spectral Doppler, color Doppler, and power Doppler as needed of all accessible portions of each vessel. Bilateral testing is considered an integral part of a complete examination. Limited examinations for reoccurring indications may be performed as noted. The reflux portion of the exam is performed with the patient in reverse Trendelenburg.  +---------+---------------+---------+-----------+----------+-------------------+  RIGHT     Compressibility Phasicity Spontaneity Properties Thrombus Aging       +---------+---------------+---------+-----------+----------+-------------------+  CFV       Full            Yes       Yes                                         +---------+---------------+---------+-----------+----------+-------------------+  SFJ       Full                                                                  +---------+---------------+---------+-----------+----------+-------------------+  FV Prox   Full                                                                  +---------+---------------+---------+-----------+----------+-------------------+  FV Mid    Full                                                                  +---------+---------------+---------+-----------+----------+-------------------+  FV Distal Full                                                                  +---------+---------------+---------+-----------+----------+-------------------+  PFV       Full                                                                  +---------+---------------+---------+-----------+----------+-------------------+  POP       Full            Yes       Yes                                         +---------+---------------+---------+-----------+----------+-------------------+  PTV                                                         Limited views of                                                                 proximal segment                                                                 patent by color      +---------+---------------+---------+-----------+----------+-------------------+  PERO                                                       Limited views of                                                                 proximal segment  patent by color      +---------+---------------+---------+-----------+----------+-------------------+   +---------+---------------+---------+-----------+----------+-------------------+  LEFT      Compressibility Phasicity Spontaneity Properties Thrombus Aging       +---------+---------------+---------+-----------+----------+-------------------+  CFV       Full            Yes       Yes                                         +---------+---------------+---------+-----------+----------+-------------------+  SFJ       Full                                                                  +---------+---------------+---------+-----------+----------+-------------------+  FV Prox   Full                                                                  +---------+---------------+---------+-----------+----------+-------------------+  FV Mid    Full                                                                  +---------+---------------+---------+-----------+----------+-------------------+  FV Distal Full                                                                  +---------+---------------+---------+-----------+----------+-------------------+  PFV       Full                                                                  +---------+---------------+---------+-----------+----------+-------------------+  POP       Full            Yes       Yes                                          +---------+---------------+---------+-----------+----------+-------------------+  PTV                                                        Limited views of  proximal segment                                                                 patent by color      +---------+---------------+---------+-----------+----------+-------------------+  PERO                                                       Limited views of                                                                 proximal segment                                                                 patent by color      +---------+---------------+---------+-----------+----------+-------------------+     Summary: RIGHT: - There is no evidence of deep vein thrombosis in the lower extremity. However, portions of this examination were limited- see technologist comments above.  - No cystic structure found in the popliteal fossa.  LEFT: - There is no evidence of deep vein thrombosis in the lower extremity. However, portions of this examination were limited- see technologist comments above.  - No cystic structure found in the popliteal fossa.  *See table(s) above for measurements and observations. Electronically signed by Jamelle Haring on 03/08/2021 at 3:14:03 PM.    Final     Labs:  CBC: Recent Labs    03/23/21 0354 03/24/21 0354 03/25/21 0347 03/26/21 0403  WBC 6.3 7.1 6.5 6.3  HGB 9.9* 9.9* 10.2* 9.2*  HCT 32.6* 31.4* 32.2* 29.3*  PLT 238 248 251 232    COAGS: Recent Labs    07/09/20 0500 08/12/20 0006  INR 1.2 1.2    BMP: Recent Labs    03/26/21 0403 03/27/21 0714 03/28/21 0405 03/29/21 1002  NA 136 137 138 135  K 4.7 4.6 4.5 4.7  CL 106 107 109 106  CO2 18* 18* 19* 17*  GLUCOSE 82 81 88 114*  BUN 77* 77* 79* 83*  CALCIUM 8.3* 8.5* 8.2* 8.0*  CREATININE 5.78* 6.30* 6.42* 6.95*  GFRNONAA 9* 8* 8* 7*    LIVER FUNCTION TESTS: Recent Labs    03/17/21 2155  03/19/21 0357 03/20/21 0340 03/24/21 0354 03/25/21 0347 03/26/21 0403 03/28/21 0405  BILITOT 0.6 0.5 0.5 0.5  --   --   --   AST 15 13* 14* 12*  --   --   --   ALT 10 8 8 8   --   --   --   ALKPHOS 75 61 64 67  --   --   --   PROT 6.6 6.1* 6.0* 6.2*  --   --   --  ALBUMIN 2.5* 2.3* 2.2* 2.2* 2.2* 2.4* 2.6*     Assessment and Plan: Progressive CKD in need of dialysis For placement of tunneled HD catheter tomorrow NPO p MN Risks and benefits of image guided hemodialysis catheter placement was discussed with the patient including, but not limited to bleeding, infection, pneumothorax, or fibrin sheath development and need for additional procedures.  All of the patient's questions were answered, patient is agreeable to proceed. Consent signed and in chart.   Thank you for this interesting consult.  I greatly enjoyed meeting Wayne Meadows and look forward to participating in their care.  A copy of this report was sent to the requesting provider on this date.  Electronically Signed: Ascencion Dike, PA-C 03/30/2021, 11:08 AM   I spent a total of 20 minutes in face to face in clinical consultation, greater than 50% of which was counseling/coordinating care for HD catheter

## 2021-03-31 ENCOUNTER — Inpatient Hospital Stay (HOSPITAL_COMMUNITY): Payer: No Typology Code available for payment source

## 2021-03-31 ENCOUNTER — Encounter (HOSPITAL_COMMUNITY): Payer: Self-pay | Admitting: Interventional Radiology

## 2021-03-31 DIAGNOSIS — N186 End stage renal disease: Secondary | ICD-10-CM | POA: Diagnosis not present

## 2021-03-31 DIAGNOSIS — Z992 Dependence on renal dialysis: Secondary | ICD-10-CM | POA: Diagnosis not present

## 2021-03-31 DIAGNOSIS — I10 Essential (primary) hypertension: Secondary | ICD-10-CM | POA: Diagnosis not present

## 2021-03-31 DIAGNOSIS — I2510 Atherosclerotic heart disease of native coronary artery without angina pectoris: Secondary | ICD-10-CM

## 2021-03-31 DIAGNOSIS — I4891 Unspecified atrial fibrillation: Secondary | ICD-10-CM | POA: Diagnosis not present

## 2021-03-31 DIAGNOSIS — N179 Acute kidney failure, unspecified: Secondary | ICD-10-CM | POA: Diagnosis not present

## 2021-03-31 HISTORY — PX: IR US GUIDE VASC ACCESS RIGHT: IMG2390

## 2021-03-31 HISTORY — PX: IR FLUORO GUIDE CV LINE RIGHT: IMG2283

## 2021-03-31 LAB — RENAL FUNCTION PANEL
Albumin: 2 g/dL — ABNORMAL LOW (ref 3.5–5.0)
Anion gap: 14 (ref 5–15)
BUN: 82 mg/dL — ABNORMAL HIGH (ref 8–23)
CO2: 19 mmol/L — ABNORMAL LOW (ref 22–32)
Calcium: 7.8 mg/dL — ABNORMAL LOW (ref 8.9–10.3)
Chloride: 104 mmol/L (ref 98–111)
Creatinine, Ser: 7.32 mg/dL — ABNORMAL HIGH (ref 0.61–1.24)
GFR, Estimated: 7 mL/min — ABNORMAL LOW (ref >60)
Glucose, Bld: 80 mg/dL (ref 70–99)
Phosphorus: 7.3 mg/dL — ABNORMAL HIGH (ref 2.5–4.6)
Potassium: 4.4 mmol/L (ref 3.5–5.1)
Sodium: 137 mmol/L (ref 135–145)

## 2021-03-31 LAB — CBC
HCT: 31 % — ABNORMAL LOW (ref 39.0–52.0)
Hemoglobin: 9.5 g/dL — ABNORMAL LOW (ref 13.0–17.0)
MCH: 28.4 pg (ref 26.0–34.0)
MCHC: 30.6 g/dL (ref 30.0–36.0)
MCV: 92.5 fL (ref 80.0–100.0)
Platelets: 252 10*3/uL (ref 150–400)
RBC: 3.35 MIL/uL — ABNORMAL LOW (ref 4.22–5.81)
RDW: 15 % (ref 11.5–15.5)
WBC: 6.1 10*3/uL (ref 4.0–10.5)
nRBC: 0 % (ref 0.0–0.2)

## 2021-03-31 MED ORDER — SODIUM CHLORIDE 0.9 % IV SOLN: 100.0000 mL | INTRAVENOUS | Status: DC | PRN

## 2021-03-31 MED ORDER — LIDOCAINE HCL 1 % IJ SOLN
INTRAMUSCULAR | Status: AC
  Filled 2021-03-31: qty 20

## 2021-03-31 MED ORDER — MIDAZOLAM HCL 2 MG/2ML IJ SOLN
INTRAMUSCULAR | Status: AC
  Filled 2021-03-31: qty 2

## 2021-03-31 MED ORDER — HEPARIN SODIUM (PORCINE) 1000 UNIT/ML IJ SOLN
INTRAMUSCULAR | Status: AC
  Filled 2021-03-31: qty 4

## 2021-03-31 MED ORDER — HEPARIN SODIUM (PORCINE) 1000 UNIT/ML DIALYSIS
1000.0000 [IU] | INTRAMUSCULAR | Status: DC | PRN
  Administered 2021-03-31: 1000 [IU] via INTRAVENOUS_CENTRAL

## 2021-03-31 MED ORDER — MIDAZOLAM HCL 2 MG/2ML IJ SOLN
INTRAMUSCULAR | Status: AC | PRN
Start: 2021-03-31 — End: 2021-03-31
  Administered 2021-03-31: 1 mg via INTRAVENOUS

## 2021-03-31 MED ORDER — HEPARIN SODIUM (PORCINE) 1000 UNIT/ML IJ SOLN
INTRAMUSCULAR | Status: AC
  Filled 2021-03-31: qty 10

## 2021-03-31 MED ORDER — CEFAZOLIN SODIUM-DEXTROSE 2-4 GM/100ML-% IV SOLN
INTRAVENOUS | Status: AC
  Administered 2021-03-31: 2 g via INTRAVENOUS
  Filled 2021-03-31: qty 100

## 2021-03-31 MED ORDER — FENTANYL CITRATE (PF) 100 MCG/2ML IJ SOLN
INTRAMUSCULAR | Status: AC | PRN
  Administered 2021-03-31: 25 ug via INTRAVENOUS

## 2021-03-31 MED ORDER — ALTEPLASE 2 MG IJ SOLR: 2.0000 mg | Freq: Once | INTRAMUSCULAR | Status: DC | PRN

## 2021-03-31 MED ORDER — FENTANYL CITRATE (PF) 100 MCG/2ML IJ SOLN
INTRAMUSCULAR | Status: AC
  Filled 2021-03-31: qty 2

## 2021-03-31 NOTE — Plan of Care (Signed)

## 2021-03-31 NOTE — Progress Notes (Signed)
Talked to ortho twice today to let them know pt will have dialysis.  Planning to come up at 6:30

## 2021-03-31 NOTE — Progress Notes (Signed)
Patient ID: Wayne Meadows, male   DOB: 24-May-1934, 87 y.o.   MRN: [id number] [facility] Progress Note   Assessment/ Plan:   1. Acute kidney Injury on chronic kidney disease stage IIIb: Baseline creatinine around 2.0 and data points to progressive worsening renal function likely of cardiorenal mechanism with poor response to diuresis. Consulting IR to place [facility] and do HD today and tomorrow. Will need vein mapping/vascular surgery consultation and the process initiated for outpatient dialysis unit placement as well.  Plavix on hold to allow [facility] placement and permanent dialysis access placement.   2.  Hypertension: Blood pressures are intermittently elevated while on clonidine, amlodipine and metoprolol.  Monitor with hemodialysis/volume unloading. 3.  Anemia: Without overt blood loss and likely secondary to chronic illness.  Iron stores replete, begin ESA with dialysis. 4.  Non-anion gap metabolic acidosis: Secondary to progressive chronic kidney disease with element of acute kidney injury, continue sodium bicarbonate at this time and stop once on dialysis. 5.  Hyperphosphatemia: Secondary to acute kidney injury on chronic kidney disease, continue lanthanum for phosphorus binding and monitor with hemodialysis/renal diet.  [name], MD 03/31/2021, 6:02 PM     Subjective:   No c/o's , seen on HD   Objective:   BP (!) 178/73 (BP Location: Left Arm)    Pulse 88    Temp 98.6 F (37 C) (Oral)    Resp 16    Ht 6\' 2"  (1.88 m)    Wt 86.5 kg    SpO2 97%    BMI 24.48 kg/m   Intake/Output Summary (Last 24 hours) at 03/31/2021 1801 Last data filed at 03/31/2021 1711 Gross per 24 hour  Intake 460 ml  Output 1300 ml  Net -840 ml    Weight change: -1.9 kg  Physical Exam: Gen: Comfortably resting in bed, awakens to calling his name and answers questions appropriately CVS: Pulse regular rhythm, normal rate, S1 and S2 normal Resp: Anteriorly clear to auscultation bilaterally, no  distinct rales or rhonchi Abd: Soft, obese, nontender Ext: Trace-1+ edema over thighs/back, trace edema over legs with [place]na boots Labs: BMET Recent Labs  Lab 03/25/21 0347 03/26/21 0403 03/27/21 0714 03/28/21 0405 03/29/21 1002 03/30/21 0136 03/31/21 0504  NA 135 136 137 138 135 138 137  K 5.1 4.7 4.6 4.5 4.7 4.6 4.4  CL 108 106 107 109 106 106 104  CO2 17* 18* 18* 19* 17* 20* 19*  GLUCOSE 77 82 81 88 114* 97 80  BUN 74* 77* 77* 79* 83* 80* 82*  CREATININE 5.55* 5.78* 6.30* 6.42* 6.95* 7.11* 7.32*  CALCIUM 8.3* 8.3* 8.5* 8.2* 8.0* 8.0* 7.8*  PHOS 7.1* 7.5*  --  8.0*  --  6.9* 7.3*    CBC Recent Labs  Lab 03/25/21 0347 03/26/21 0403 03/31/21 0459  WBC 6.5 6.3 6.1  HGB 10.2* 9.2* 9.5*  HCT 32.2* 29.3* 31.0*  MCV 93.1 94.8 92.5  PLT 251 232 252     Medications:     amLODipine  10 mg Oral Daily   aspirin EC  81 mg Oral Daily   Chlorhexidine Gluconate Cloth  6 each Topical Q0600   cloNIDine  0.1 mg Oral Daily   docusate sodium  100 mg Oral BID   ferrous sulfate  325 mg Oral BID WC   furosemide  80 mg Intravenous TID   heparin  5,000 Units Subcutaneous Q8H   heparin sodium (porcine)       heparin sodium (porcine)  hydrocerin   Topical Once per day on Mon Thu   lanthanum  1,000 mg Oral TID WC   levothyroxine  50 mcg Oral Q0600   lidocaine  1 patch Transdermal Q24H   lidocaine       melatonin  3 mg Oral QHS   metoprolol tartrate  25 mg Oral Daily   polyethylene glycol  17 g Oral Daily   pravastatin  40 mg Oral QHS   sodium bicarbonate  650 mg Oral TID

## 2021-03-31 NOTE — Progress Notes (Addendum)
Requested to see pt for out-pt HD needs at d/c. Met with pt at bedside while receiving HD. Introduced self and discussed role. Pt prefers a clinic close to his home. Pt also for snf placement and will need to be clipped [facility] or [facility] per CSW note. [facility] is closest to pt's home and snf can provide transport to this clinic. Will make referral to [facility] admissions this afternoon. Pt states he plans to drive self or have family/friends assist if needed. Pt also agreeable to medicare benefits being used for out-pt HD needs. Will follow and assist.   Melven Sartorius Renal Navigator [phone]

## 2021-03-31 NOTE — TOC Progression Note (Signed)
Transition of Care [facility]) - Initial/Assessment Note    Patient Details  Name: Wayne Meadows MRN: [id number] Date of Birth: Feb 14, 1935  [facility] CM/SW Contact:    Milinda Antis, [place] Phone Number: 03/31/2021, 12:31 PM  Clinical Narrative:                 CSW met with the patient at bedside to follow up on SNF referrals made while the patient was at the other hospital.  The patient is still agreeable to SNF placement at [facility].  CSW spoke with Narda Rutherford at [facility] and the facility is open to accepting the patient.  The patient will need to go to [facility]. or [facility] for dialysis on MWF for the facility to accommodate.  CSW inquired about whether the facility was aware if the [place] has been contacted.  Narda Rutherford reported that she did not think that process was started.    CSW attempted to contact a Education officer, museum at [employer].  CSW was transferred numerous times and was unable to leave a message.  CSW sent the SNF approval form and will await a response.    Expected Discharge Plan: Skilled Nursing Facility Barriers to Discharge: No Barriers Identified   Patient Goals and CMS Choice Patient states their goals for this hospitalization and ongoing recovery are:: To get stronger CMS Medicare.gov Compare Post Acute Care list provided to:: Patient Choice offered to / list presented to : Patient  Expected Discharge Plan and Services Expected Discharge Plan: [place]   Discharge Planning Services: CM Consult Post Acute Care Choice: [place] Living arrangements for the past 2 months: Single Family Home                                      Prior Living Arrangements/Services Living arrangements for the past 2 months: Single Family Home Lives with:: Self Patient language and need for interpreter reviewed:: No Do you feel safe going back to the place where you live?: Yes        Care giver support system in place?: Yes  (comment)      Activities of Daily Living Home Assistive Devices/Equipment: Gil[place] Rile (specify type) ADL Screening (condition at time of admission) Patient's cognitive ability adequate to safely complete daily activities?: Yes Is the patient deaf or have difficulty hearing?: No Does the patient have difficulty seeing, even when wearing glasses/contacts?: No Does the patient have difficulty concentrating, remembering, or making decisions?: No Patient able to express need for assistance with ADLs?: Yes Does the patient have difficulty dressing or bathing?: Yes Independently performs ADLs?: No Communication: Independent Dressing (OT): Needs assistance Grooming: Independent Feeding: Independent Bathing: Needs assistance Toileting: Needs assistance In/Out Bed: Needs assistance Does the patient have difficulty walking or climbing stairs?: No Weakness of Legs: Both Weakness of Arms/Hands: None  Permission Sought/Granted Permission sought to share information with : Case Manager                Emotional Assessment Appearance:: Appears stated age     Orientation: : Oriented to Self, Oriented to Place, Oriented to  Time, Oriented to Situation      Admission diagnosis:  ARF (acute renal failure) ([place]) [N17.9] Nausea & vomiting [R11.2] Acute renal failure (ARF) (HCC) [N17.9] AKI (acute kidney injury) ([place]) [N17.9] Patient Active Problem List   Diagnosis Date Noted   Pressure injury of skin 03/30/2021  ARF (acute renal failure) ([place]) 03/18/2021   Acute renal failure (ARF) ([place]) 03/17/2021   Abnormal echocardiogram 03/08/2021   Proteinuria 03/07/2021   AKI (acute kidney injury) ([place]) 03/05/2021   Hypothyroidism 03/05/2021   Anxiety 03/05/2021   Rash 03/05/2021   Recurrent cellulitis of lower leg 03/04/2021   Chronic pancreatitis ([place]) 12/24/2020   Abnormal finding on GI tract imaging    Acute pancreatitis 08/12/2020   Nausea and vomiting in adult 06/22/2020   Gastric  lesion    Esophageal stricture    Refractory nausea and vomiting 06/20/2020   Sepsis, unspecified organism ([place]) 04/11/2016   COPD exacerbation ([place]) 04/11/2016   CKD (chronic kidney disease), stage III ([place]) 04/11/2016   Hyperbilirubinemia 04/11/2016   Elevated lactic acid level    Renal insufficiency    Segmental colitis ([place]) 09/04/2015   Generalized abdominal pain 09/04/2015   Noninfectious gastroenteritis, unspecified    Benign neoplasm of transverse colon    Benign neoplasm of colon    Bright red blood per rectum    Acute blood loss anemia    Dysphagia    Renal cell cancer (HCC)    Essential hypertension    Centrilobular emphysema ([place])    Blood in stool 08/19/2015   Rectal bleeding 08/19/2015   Pleuritic chest pain 04/08/2014   CAP (community acquired pneumonia) 04/08/2014   Community acquired pneumonia of left lower lobe of lung 04/08/2014   Overweight (BMI 25.0-29.9) 04/08/2014   Healthcare-associated pneumonia 10/26/2011   Cholelithiasis 04/26/2011   Atrial fibrillation ([place]) 04/26/2011   Chest pain 03/19/2011   Hypertension 03/19/2011   Unstable angina ([place]) 03/18/2011    Class: Acute   Lung nodule 03/02/2011   Liver lesion 03/02/2011   Other hyperlipidemia 03/02/2011   Abnormal CT of the abdomen 02/23/2011   Non Q wave myocardial infarction ([place]) 02/22/2011    Class: Acute   Cerebrovascular disease 02/22/2011   CAD (coronary artery disease) 02/22/2011   COPD (chronic obstructive pulmonary disease) ([place]) 02/22/2011   Tobacco abuse, 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/22/2011   PCP:  Clinic, [place]:   [id number] [place] [facility] [id number]: [phone]: [phone]    Social Determinants of Health (SDOH) Interventions    Readmission Risk Interventions Readmission Risk Prevention Plan 03/21/2021 03/19/2021  Transportation Screening Complete -   HRI or Home Care Consult Complete -  SW Recovery Care/Counseling Consult Complete Complete  Skilled Nursing Facility Complete Complete  Some recent data might be hidden

## 2021-03-31 NOTE — Procedures (Signed)
Interventional Radiology Procedure Note  Procedure: Right IJ 23 cm Palindrome tunneled HD catheter.  Cath tip in RA and ready for use.  Complications: None  Estimated Blood Loss: None  Recommendations: - Routine line care.   Signed,  Criselda Peaches, MD

## 2021-03-31 NOTE — Consult Note (Addendum)
Hospital Consult    Reason for Consult:  permanent dialysis access Requesting Physician:  Dr. Jonnie Finner MRN #:  [id number]  History of Present Illness: This is a 87 y.o. male being seen in consultation for evaluation for permanent dialysis access placement.  Right IJ TDC was placed by interventional radiology today.  He was seen during HD treatment and right IJ TDC seems to be working well.  He is left arm dominant and would prefer access placement in his right arm.  He does not have a pacemaker.  He does not take blood thinners.  Patient states he was also admitted for management of cellulitis of bilateral lower extremities.  It appears he has Unna boot wraps to the level of the knee bilaterally.  Past Medical History:  Diagnosis Date   Arthritis    Carotid stenosis    COPD (chronic obstructive pulmonary disease) ([place])    Coronary artery disease    S/p PCI 2011;  NSTEMI 12/12:  LHC/PCI 02/23/11: LAD 60% after the septal perforator, D1 occluded with distal collaterals, proximal RI 30-40%, AV circumflex stent patent with 60% stenosis after the stent, RCA 99%, EF 60-65%.  His RCA was treated with a bare-metal stent   CVA (cerebral infarction) 2011   Right cerebral; total obstruction of the right ICA   Diverticulitis    Hypertension    Rectal bleeding 07/2015   Renal carcinoma (HCC)    Stroke ([place])    Tobacco abuse, in remission     Past Surgical History:  Procedure Laterality Date   COLON SURGERY     COLONOSCOPY N/A 08/22/2015   Procedure: COLONOSCOPY;  Surgeon: [name], MD;  Location: [facility] ENDOSCOPY;  Service: Endoscopy;  Laterality: N/A;   ESOPHAGOGASTRODUODENOSCOPY N/A 07/18/2020   Procedure: ESOPHAGOGASTRODUODENOSCOPY (EGD);  Surgeon: Milus Banister, MD;  Location: [facility];  Service: Endoscopy;  Laterality: N/A;   ESOPHAGOGASTRODUODENOSCOPY (EGD) WITH PROPOFOL N/A 06/21/2020   Procedure: ESOPHAGOGASTRODUODENOSCOPY (EGD) WITH PROPOFOL;  Surgeon: Irene Shipper, MD;   Location: [facility] ENDOSCOPY;  Service: Endoscopy;  Laterality: N/A;   EUS N/A 07/18/2020   Procedure: UPPER ENDOSCOPIC ULTRASOUND (EUS) RADIAL;  Surgeon: Milus Banister, MD;  Location: [facility] ENDOSCOPY;  Service: Endoscopy;  Laterality: N/A;   FINE NEEDLE ASPIRATION N/A 07/18/2020   Procedure: FINE NEEDLE ASPIRATION (FNA) LINEAR;  Surgeon: Milus Banister, MD;  Location: [facility] ENDOSCOPY;  Service: Endoscopy;  Laterality: N/A;   hip relacement     IR FLUORO GUIDE CV LINE RIGHT  03/31/2021   IR US GUIDE VASC ACCESS RIGHT  03/31/2021   KIDNEY SURGERY     LEFT HEART CATH AND CORONARY ANGIOGRAPHY N/A 07/09/2020   Procedure: LEFT HEART CATH AND CORONARY ANGIOGRAPHY;  Surgeon: Leonie Man, MD;  Location: [facility];  Service: Cardiovascular;  Laterality: N/A;   LEFT HEART CATHETERIZATION WITH CORONARY ANGIOGRAM N/A 02/23/2011   Procedure: LEFT HEART CATHETERIZATION WITH CORONARY ANGIOGRAM;  Surgeon: [name], MD;  Location: [facility] CATH LAB;  Service: Cardiovascular;  Laterality: N/A;   LEFT HEART CATHETERIZATION WITH CORONARY ANGIOGRAM N/A 03/18/2011   Procedure: LEFT HEART CATHETERIZATION WITH CORONARY ANGIOGRAM;  Surgeon: [name], MD;  Location: [facility] CATH LAB;  Service: Cardiovascular;  Laterality: N/A;   PERCUTANEOUS CORONARY STENT INTERVENTION (PCI-S) N/A 02/23/2011   Procedure: PERCUTANEOUS CORONARY STENT INTERVENTION (PCI-S);  Surgeon: [name], MD;  Location: [facility] CATH LAB;  Service: Cardiovascular;  Laterality: N/A;   TEMPORARY PACEMAKER INSERTION N/A 02/23/2011   Procedure: TEMPORARY PACEMAKER  INSERTION;  Surgeon: [name], MD;  Location: [facility] CATH LAB;  Service: Cardiovascular;  Laterality: N/A;    Allergies  Allergen Reactions   Bee Venom Anaphylaxis    Has epi pen   Influenza Vaccines Other (See Comments)    "Mortally sick for 2 weeks"    Prior to Admission medications   Medication Sig Start Date End Date Taking? Authorizing Provider  amLODipine (NORVASC) 10 MG tablet  Take 1 tablet (10 mg total) by mouth daily. 07/11/20  Yes Margie Billet, NP  aspirin EC 81 MG tablet Take 81 mg by mouth daily.   Yes [provider]  baclofen (LIORESAL) 10 MG tablet Take 10 mg by mouth at bedtime.   Yes [provider]  cloNIDine (CATAPRES) 0.1 MG tablet Take 1 tablet (0.1 mg total) by mouth daily. 06/24/20  Yes Aslam, Loralyn Freshwater, MD  clopidogrel (PLAVIX) 75 MG tablet Take 75 mg by mouth daily.   Yes [provider]  ferrous sulfate 325 (65 FE) MG tablet Take 1 tablet by mouth. Every Monday, Wednesday, and Friday 07/30/20  Yes [provider]  furosemide (LASIX) 40 MG tablet Take 40 mg by mouth 2 (two) times daily. 03/14/21  Yes [provider]  gabapentin (NEURONTIN) 300 MG capsule Take 300-600 mg by mouth See admin instructions. Take 300 mg by mouth in the morning then 300 mg at noon then 600 mg at bedtime   Yes [provider]  HYDROcodone-acetaminophen (NORCO) 10-325 MG tablet Take 1 tablet by mouth 2 (two) times daily.   Yes [provider]  Ipratropium-Albuterol (COMBIVENT) 20-100 MCG/ACT AERS respimat Inhale 1 puff into the lungs 4 (four) times daily.   Yes [provider]  levothyroxine (SYNTHROID) 50 MCG tablet Take 50 mcg by mouth daily before breakfast.   Yes [provider]  metoprolol tartrate (LOPRESSOR) 25 MG tablet Take 25 mg by mouth daily.   Yes [provider]  Multiple Vitamin (MULTIVITAMIN) tablet Take 1 tablet by mouth daily.   Yes [provider]  polyethylene glycol (MIRALAX / GLYCOLAX) 17 g packet Take 17 g by mouth daily as needed for mild constipation or moderate constipation. 08/15/20  Yes [name], MD  pravastatin (PRAVACHOL) 40 MG tablet Take 40 mg by mouth at bedtime.   Yes [provider]  albuterol (VENTOLIN HFA) 108 (90 Base) MCG/ACT inhaler Inhale 1 puff into the lungs every 6 (six) hours as needed for shortness of breath.    [provider]   diclofenac Sodium (VOLTAREN) 1 % GEL Apply 2 g topically 4 (four) times daily. Patient not taking: Reported on 03/04/2021 06/23/20   Harvie Heck, MD  EPINEPHrine (EPI-PEN) 0.3 mg/0.3 mL DEVI Inject 0.3 mg into the muscle once as needed (for anaphylactic reaction).  Patient not taking: Reported on 03/18/2021    [provider]  LORazepam (ATIVAN) 0.5 MG tablet 1 PO 1 hour before MRI repeat after 30 minutes if needed Patient not taking: Reported on 03/04/2021 08/21/20   [name], MD  nitroGLYCERIN (NITROSTAT) 0.4 MG SL tablet Place 0.4 mg under the tongue every 5 (five) minutes as needed for chest pain. Patient not taking: Reported on 03/18/2021    [provider]    Social History   Socioeconomic History   Marital status: Divorced    Spouse name: Not on file   Number of children: Not on file   Years of education: Not on file   Highest education level: Not on file  Occupational History   Not on file  Tobacco Use   Smoking status: Former    Types: Cigarettes    Quit date: 02/24/2007    Years since quitting: 14.1   Smokeless tobacco: Never  Vaping Use   Vaping Use: Never used  Substance and Sexual Activity   Alcohol use: Not Currently    Alcohol/week: 1.0 standard drink    Types: 1 Standard drinks or equivalent per week    Comment: socially    Drug use: No   Sexual activity: Not on file  Other Topics Concern   Not on file  Social History Narrative   Not on file   Social Determinants of Health   Financial Resource Strain: Not on file  Food Insecurity: Not on file  Transportation Needs: Not on file  Physical Activity: Not on file  Stress: Not on file  Social Connections: Not on file  Intimate Partner Violence: Not on file     Family History  Problem Relation Age of Onset   Heart attack Other 84    ROS: Otherwise negative unless mentioned in HPI  Physical Examination  Vitals:   03/31/21 1600 03/31/21 1630  BP: (!) 159/74 (!) 180/78   Pulse: 79 83  Resp: 15 14  Temp:    SpO2:     Body mass index is 24.65 kg/m.  General:  WDWN in NAD Gait: Not observed HENT: WNL, normocephalic Pulmonary: normal non-labored breathing, without Rales, rhonchi,  wheezing Cardiac: regular Abdomen:  soft, NT/ND, no masses Skin: without rashes Vascular Exam/Pulses: symmetrical radial pulses Extremities: IV in right AC fossa Musculoskeletal: no muscle wasting or atrophy  Neurologic: A&O X 3;  No focal weakness or paresthesias are detected; speech is fluent/normal Psychiatric:  The pt has Normal affect. Lymph:  Unremarkable  CBC    Component Value Date/Time   WBC 6.1 03/31/2021 0459   RBC 3.35 (L) 03/31/2021 0459   HGB 9.5 (L) 03/31/2021 0459   HCT 31.0 (L) 03/31/2021 0459   PLT 252 03/31/2021 0459   MCV 92.5 03/31/2021 0459   MCH 28.4 03/31/2021 0459   MCHC 30.6 03/31/2021 0459   RDW 15.0 03/31/2021 0459   LYMPHSABS 1.4 03/17/2021 2155   MONOABS 0.9 03/17/2021 2155   EOSABS 0.3 03/17/2021 2155   BASOSABS 0.1 03/17/2021 2155    BMET    Component Value Date/Time   NA 137 03/31/2021 0504   K 4.4 03/31/2021 0504   CL 104 03/31/2021 0504   CO2 19 (L) 03/31/2021 0504   GLUCOSE 80 03/31/2021 0504   BUN 82 (H) 03/31/2021 0504   CREATININE 7.32 (H) 03/31/2021 0504   CALCIUM 7.8 (L) 03/31/2021 0504   GFRNONAA 7 (L) 03/31/2021 0504   GFRAA 52 (L) 07/31/2017 1630    COAGS: Lab Results  Component Value Date   INR 1.2 08/12/2020   INR 1.2 07/09/2020   INR 1.13 07/31/2017     Non-Invasive Vascular Imaging:   Vein map pending    ASSESSMENT/PLAN: This is a 87 y.o. male with end-stage renal disease on hemodialysis via right IJ TDC.  He is being evaluated for placement of permanent dialysis access  -Patient is left arm dominant and would prefer access placement in his right arm -Vein mapping is pending; we will await vein mapping results prior to making a decision on right arm IV -Due to scheduling, dialysis access  placement will be on Wednesday or Thursday at the earliest.  We will make a decision on laterality based on  vein map results -On-call vascular surgeon Dr. Virl Cagey will evaluate the patient later today and provide further treatment plans   Dagoberto Ligas PA-C [facility] [phone]  Will follow up vein mapping and make recommendations, regarding fistula v graft and timing to OR accordingly.   Broadus John

## 2021-03-31 NOTE — Progress Notes (Signed)
PROGRESS NOTE  Wayne Meadows ZHG:[id number] DOB: 1934/03/31 DOA: 03/17/2021 PCP: [facility]   LOS: 13 days   Brief narrative: Wayne Meadows is a 87 y.o. male with history of chronic kidney disease stage IIIb, CVA and CAD, diabetes mellitus, COPD, hypertension was recently admitted for cellulitis of the lower extremity, discharged on antibiotic presented to hospital with increasing weakness, difficulty ambulating with increasing redness and swelling of his lower extremities.  In the ED, patient was noted to have weeping of his bilateral lower extremities with erythema.  Creatinine had worsened from 2.9 to 4.8 on presentation.   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  Nephrology was consulted during hospitalization for management of fluid overload and renal failure.  2/5 patient is drowsy.  Denies any shortness of breath, chest pain, abdominal pain.  Did vomit x1.  Nothing at this point. 2/6 status post right IJ tunneled HD catheter placed by IR today  Assessment/Plan:  Principal Problem:   Acute renal failure (ARF) (HCC) Active Problems:   CAD (coronary artery disease)   COPD (chronic obstructive pulmonary disease) (HCC)   Atrial fibrillation (HCC)   Renal cell cancer (HCC)   Essential hypertension   Centrilobular emphysema (HCC)   AKI (acute kidney injury) ([place])   Hypothyroidism   ARF (acute renal failure) (HCC)   Pressure injury of skin   Acute on chronic kidney disease stage IV with non-anion gap metabolic acidosis: Followed by nephrology. Currently on conservative management. On intermittent albumin and Lasix trial.  Urine output 1000 mL last 24 hours. Renal function continues to worsen. Renal ultrasound with echogenic kidneys consistent with CKD.   No uremic symptoms but again no meaningful recovery of renal functions.   Further management as per  nephrology.   Patient is potentially needing hemodialysis, will hold Plavix for now. Foley catheter exchanged with good outcome. 2/6 right IJ HD catheter placed by IR today We will need vein mapping/vascular surgery Process initiated for outpatient dialysis center placement  Continue IV Lasix    Gross bilateral lower extremity edema anasarca/venous insufficiency.  Patient did have a recent ABI which was normal.  Dopplers were negative for DVT at that time.  Continue Unna boots.  Changed 2/2. Continue IV Lasix  HD as above     CAD  History of coronary stent placement. Chest pain-free 2/6 continue aspirin. Plavix held for IR procedure as above.  Will see when okay to resume      Essential hypertension  BP overall stable has functions of elevation  Continue clonidine, amlodipine, and metoprolol      Hypothyroidism Continue Synthroid   COPD  Compensated at this time.  Continue bronchodilators.   Chronic anemia likely from renal disease, follow CBC.  Hemoglobin is a stable at 9-10.  Debility, deconditioning. Patient lives by himself at home and has a walker and cane at home.  Physical therapy has recommended skilled nursing facility on discharge.  Will refer to SNF once medically stable.  Disposition.  Likely to skilled nursing facility on discharge pending clinical improvement.  DVT prophylaxis: heparin injection 5,000 Units Start: 03/18/21 0600   Code Status: Full code  Family Communication: None at bedside   Status is: Inpatient   Remains inpatient appropriate because: Worsening CKD, fluid overload, IV diuretics, needs HD catheter placement, vascular surgery consulted  Consultants: Nephrology IR  Procedures: None  Anti-infectives:  Rocephin IV 1/24>1/26  Anti-infectives (From admission, onward)    Start     Dose/Rate Route Frequency Ordered Stop   03/31/21 0600  ceFAZolin (ANCEF) IVPB 2g/100 mL premix        2 g 200 mL/hr over 30 Minutes  Intravenous On call 03/30/21 1118 03/31/21 0907   03/18/21 0200  cefTRIAXone (ROCEPHIN) 2 g in sodium chloride 0.9 % 100 mL IVPB  Status:  Discontinued        2 g 200 mL/hr over 30 Minutes Intravenous Every 24 hours 03/18/21 0158 03/20/21 1542      Subjective: Has no complaints. Denies sob, cp, dizziness  Vitals:   03/31/21 0910 03/31/21 0929  BP: (!) 154/69 (!) 145/70  Pulse: 76 71  Resp: 17 18  Temp:  97.9 F (36.6 C)  SpO2: 99% 97%    Intake/Output Summary (Last 24 hours) at 03/31/2021 1439 Last data filed at 03/31/2021 7564 Gross per 24 hour  Intake 340 ml  Output 1525 ml  Net -1185 ml   Filed Weights   03/29/21 0500 03/30/21 0650 03/31/21 0500  Weight: 92.5 kg 92.3 kg 90.4 kg   Body mass index is 25.59 kg/m.   Physical Exam: Calm, NAD Cta no w/r Rt IJ cath in place. Dressing on it Reg s1/s2 no gallop Soft benign +bs No edema Aaoxox3  Mood and affect appropriate in current setting   Data Review: I have personally reviewed the following laboratory data and studies,  CBC: Recent Labs  Lab 03/25/21 0347 03/26/21 0403 03/31/21 0459  WBC 6.5 6.3 6.1  HGB 10.2* 9.2* 9.5*  HCT 32.2* 29.3* 31.0*  MCV 93.1 94.8 92.5  PLT 251 232 332   Basic Metabolic Panel: Recent Labs  Lab 03/25/21 0347 03/26/21 0403 03/27/21 0714 03/28/21 0405 03/29/21 1002 03/30/21 0136 03/31/21 0504  NA 135 136 137 138 135 138 137  K 5.1 4.7 4.6 4.5 4.7 4.6 4.4  CL 108 106 107 109 106 106 104  CO2 17* 18* 18* 19* 17* 20* 19*  GLUCOSE 77 82 81 88 114* 97 80  BUN 74* 77* 77* 79* 83* 80* 82*  CREATININE 5.55* 5.78* 6.30* 6.42* 6.95* 7.11* 7.32*  CALCIUM 8.3* 8.3* 8.5* 8.2* 8.0* 8.0* 7.8*  MG 2.3 2.4  --   --   --   --   --   PHOS 7.1* 7.5*  --  8.0*  --  6.9* 7.3*   Liver Function Tests: Recent Labs  Lab 03/25/21 0347 03/26/21 0403 03/28/21 0405 03/30/21 0136 03/31/21 0504  ALBUMIN 2.2* 2.4* 2.6* 2.1* 2.0*   No results for input(s): LIPASE, AMYLASE in the last 168  hours. No results for input(s): AMMONIA in the last 168 hours. Cardiac Enzymes: No results for input(s): CKTOTAL, CKMB, CKMBINDEX, TROPONINI in the last 168 hours.  BNP (last 3 results) Recent Labs    03/07/21 1714 03/08/21 0509 03/17/21 2155  BNP 931.2* 915.5* 280.9*    ProBNP (last 3 results) No results for input(s): PROBNP in the last 8760 hours.  CBG: No results for input(s): GLUCAP in the last 168 hours. No results found for this or any previous visit (from the past 240 hour(s)).     Studies: IR Fluoro Guide CV Line Right  Result Date: 03/31/2021 INDICATION: 87 year old male with progressive renal dysfunction in need of acute hemodialysis. He presents for EXAM: TUNNELED CENTRAL VENOUS HEMODIALYSIS CATHETER PLACEMENT WITH ULTRASOUND AND FLUOROSCOPIC GUIDANCE MEDICATIONS: 2 g Ancef. The antibiotic was given in an appropriate time interval prior to skin  puncture. ANESTHESIA/SEDATION: Moderate (conscious) sedation was employed during this procedure. A total of Versed 1 mg and Fentanyl 25 mcg was administered intravenously. Moderate Sedation Time: 17 minutes. The patient's level of consciousness and vital signs were monitored continuously by radiology nursing throughout the procedure under my direct supervision. FLUOROSCOPY TIME:  2 mGy, air kerma COMPLICATIONS: None immediate. PROCEDURE: Informed written consent was obtained from the patient after a discussion of the risks, benefits, and alternatives to treatment. Questions regarding the procedure were encouraged and answered. The right neck and chest were prepped with chlorhexidine in a sterile fashion, and a sterile drape was applied covering the operative field. Maximum barrier sterile technique with sterile gowns and gloves were used for the procedure. A timeout was performed prior to the initiation of the procedure. After creating a small venotomy incision, a micropuncture kit was utilized to access the right internal jugular vein  under direct, real-time ultrasound guidance after the overlying soft tissues were anesthetized with 1% lidocaine with epinephrine. Ultrasound image documentation was performed. The microwire was kinked to measure appropriate catheter length. A stiff Glidewire was advanced to the level of the IVC and the micropuncture sheath was exchanged for a peel-away sheath. A Palindrome tunneled hemodialysis catheter measuring 23 cm from tip to cuff was tunneled in a retrograde fashion from the anterior chest wall to the venotomy incision. The catheter was then placed through the peel-away sheath with tips ultimately positioned within the superior aspect of the right atrium. Final catheter positioning was confirmed and documented with a spot radiographic image. The catheter aspirates and flushes normally. The catheter was flushed with appropriate volume heparin dwells. The catheter exit site was secured with a 0-Prolene retention suture. The venotomy incision was closed with an interrupted 4-0 Vicryl, Dermabond and Steri-strips. Dressings were applied. The patient tolerated the procedure well without immediate post procedural complication. IMPRESSION: Successful placement of 23 cm tip to cuff tunneled hemodialysis catheter via the right internal jugular vein with tips terminating within the superior aspect of the right atrium. The catheter is ready for immediate use. Electronically Signed   By: Jacqulynn Cadet M.D.   On: 03/31/2021 10:48    Total time spent: 35 minutes  Nolberto Hanlon, MD  [facility] 03/31/2021  If 7PM-7AM, please contact night-coverage

## 2021-04-01 ENCOUNTER — Encounter (HOSPITAL_COMMUNITY): Payer: Medicare Other

## 2021-04-01 DIAGNOSIS — I2510 Atherosclerotic heart disease of native coronary artery without angina pectoris: Secondary | ICD-10-CM | POA: Diagnosis not present

## 2021-04-01 DIAGNOSIS — J449 Chronic obstructive pulmonary disease, unspecified: Secondary | ICD-10-CM | POA: Diagnosis not present

## 2021-04-01 DIAGNOSIS — E039 Hypothyroidism, unspecified: Secondary | ICD-10-CM | POA: Diagnosis not present

## 2021-04-01 DIAGNOSIS — E872 Acidosis, unspecified: Secondary | ICD-10-CM

## 2021-04-01 DIAGNOSIS — N179 Acute kidney failure, unspecified: Secondary | ICD-10-CM | POA: Diagnosis not present

## 2021-04-01 LAB — RENAL FUNCTION PANEL
Albumin: 2.2 g/dL — ABNORMAL LOW (ref 3.5–5.0)
Anion gap: 14 (ref 5–15)
BUN: 56 mg/dL — ABNORMAL HIGH (ref 8–23)
CO2: 23 mmol/L (ref 22–32)
Calcium: 7.9 mg/dL — ABNORMAL LOW (ref 8.9–10.3)
Chloride: 104 mmol/L (ref 98–111)
Creatinine, Ser: 5.73 mg/dL — ABNORMAL HIGH (ref 0.61–1.24)
GFR, Estimated: 9 mL/min — ABNORMAL LOW (ref >60)
Glucose, Bld: 73 mg/dL (ref 70–99)
Phosphorus: 5.7 mg/dL — ABNORMAL HIGH (ref 2.5–4.6)
Potassium: 4.4 mmol/L (ref 3.5–5.1)
Sodium: 141 mmol/L (ref 135–145)

## 2021-04-01 LAB — CBC
HCT: 30.8 % — ABNORMAL LOW (ref 39.0–52.0)
Hemoglobin: 9.6 g/dL — ABNORMAL LOW (ref 13.0–17.0)
MCH: 29 pg (ref 26.0–34.0)
MCHC: 31.2 g/dL (ref 30.0–36.0)
MCV: 93.1 fL (ref 80.0–100.0)
Platelets: 275 10*3/uL (ref 150–400)
RBC: 3.31 MIL/uL — ABNORMAL LOW (ref 4.22–5.81)
RDW: 14.9 % (ref 11.5–15.5)
WBC: 8.1 10*3/uL (ref 4.0–10.5)
nRBC: 0 % (ref 0.0–0.2)

## 2021-04-01 MED ORDER — HEPARIN SODIUM (PORCINE) 1000 UNIT/ML IJ SOLN
INTRAMUSCULAR | Status: AC
  Administered 2021-04-01: 1000 [IU]
  Filled 2021-04-01: qty 4

## 2021-04-01 NOTE — Progress Notes (Signed)
Pt has been accepted at [facility] on TTS schedule. Pt will need to arrive at 7:00 for 7:20 chair time. Working with clinic to figure out how pt will complete paperwork prior to first treatment since pt is supposed to admit to snf at d/c. Met with pt at bedside to discuss details and provide schedule letter but pt is not feeling well and request navigator to return tomorrow to discuss out-pt arrangements. Update provided to nephrologist. Will provide update to CSW as well for snf placement purposes. Will follow and assist.   Melven Sartorius Renal Navigator [phone]

## 2021-04-01 NOTE — TOC Progression Note (Addendum)
Transition of Care [facility]) - Initial/Assessment Note    Patient Details  Name: Wayne Meadows MRN: [id number] Date of Birth: [birth year]  Transition of Care [facility]) CM/SW Contact:    Milinda Antis, LCSWA Phone Number: 04/01/2021, 11:59 AM  Clinical Narrative:                 CSW received a VM from [place], Education officer, museum with the [place] [phone]).  CSW attempted to return the call and there was no answer.  CSW left another VM requesting a returned call.    12:23-  CSW received a returned call from [place], [place] with the [place] and was informed that the patient's social worker is Lesly Rubenstein at phone number [phone] ex. [phone].  CSW left a secured VM requesting a returned call from this Education officer, museum.    13:00-  CSW received a returned call from social worker, Lesly Rubenstein, and was informed that the [place] decided that the patient would need to use his Medicare benefits for SNF placement.  CSW inquired about the patient's Medicare number and benefits as this is not listed in Epic.  CSW was informed that the patient has Medicare A, B, and D.  The patient's Medicare number as given by the [place] is [id number].    CSW contacted Janie with admissions at [facility] to inquire about whether they can accept the patient's Medicare.  There was no answer.  CSW is awaiting a response.  Expected Discharge Plan: Skilled Nursing Facility Barriers to Discharge: No Barriers Identified   Patient Goals and CMS Choice Patient states their goals for this hospitalization and ongoing recovery are:: To get stronger CMS Medicare.gov Compare Post Acute Care list provided to:: Patient Choice offered to / list presented to : Patient  Expected Discharge Plan and Services Expected Discharge Plan: [place]   Discharge Planning Services: CM Consult Post Acute Care Choice: [place] Living arrangements for the past 2 months: Single Family Home                                       Prior Living Arrangements/Services Living arrangements for the past 2 months: Single Family Home Lives with:: Self Patient language and need for interpreter reviewed:: No Do you feel safe going back to the place where you live?: Yes        Care giver support system in place?: Yes (comment)      Activities of Daily Living Home Assistive Devices/Equipment: Gilford Rile (specify type) ADL Screening (condition at time of admission) Patient's cognitive ability adequate to safely complete daily activities?: Yes Is the patient deaf or have difficulty hearing?: No Does the patient have difficulty seeing, even when wearing glasses/contacts?: No Does the patient have difficulty concentrating, remembering, or making decisions?: No Patient able to express need for assistance with ADLs?: Yes Does the patient have difficulty dressing or bathing?: Yes Independently performs ADLs?: No Communication: Independent Dressing (OT): Needs assistance Grooming: Independent Feeding: Independent Bathing: Needs assistance Toileting: Needs assistance In/Out Bed: Needs assistance Does the patient have difficulty walking or climbing stairs?: No Weakness of Legs: Both Weakness of Arms/Hands: None  Permission Sought/Granted Permission sought to share information with : Case Manager                Emotional Assessment Appearance:: Appears stated age     Orientation: : Oriented to Self, Oriented to Place, Oriented to  Time, Oriented to Situation      Admission diagnosis:  ARF (acute renal failure) (HCC) [N17.9] Nausea & vomiting [R11.2] Acute renal failure (ARF) (HCC) [N17.9] AKI (acute kidney injury) ([place]) [N17.9] Patient Active Problem List   Diagnosis Date Noted   Pressure injury of skin 03/30/2021   ARF (acute renal failure) ([place]) 03/18/2021   Acute renal failure (ARF) ([place]) 03/17/2021   Abnormal echocardiogram 03/08/2021   Proteinuria 03/07/2021   AKI (acute kidney injury) ([place]) 03/05/2021    Hypothyroidism 03/05/2021   Anxiety 03/05/2021   Rash 03/05/2021   Recurrent cellulitis of lower leg 03/04/2021   Chronic pancreatitis ([place]) 12/24/2020   Abnormal finding on GI tract imaging    Acute pancreatitis 08/12/2020   Nausea and vomiting in adult 06/22/2020   Gastric lesion    Esophageal stricture    Refractory nausea and vomiting 06/20/2020   Sepsis, unspecified organism ([place]) 04/11/2016   COPD exacerbation ([place]) 04/11/2016   CKD (chronic kidney disease), stage III ([place]) 04/11/2016   Hyperbilirubinemia 04/11/2016   Elevated lactic acid level    Renal insufficiency    Segmental colitis ([place]) 09/04/2015   Generalized abdominal pain 09/04/2015   Noninfectious gastroenteritis, unspecified    Benign neoplasm of transverse colon    Benign neoplasm of colon    Bright red blood per rectum    Acute blood loss anemia    Dysphagia    Renal cell cancer ([place])    Essential hypertension    Centrilobular emphysema ([place])    Blood in stool 08/19/2015   Rectal bleeding 08/19/2015   Pleuritic chest pain 04/08/2014   CAP (community acquired pneumonia) 04/08/2014   Community acquired pneumonia of left lower lobe of lung 04/08/2014   Overweight (BMI 25.0-29.9) 04/08/2014   Healthcare-associated pneumonia 10/26/2011   Cholelithiasis 04/26/2011   Atrial fibrillation ([place]) 04/26/2011   Chest pain 03/19/2011   Hypertension 03/19/2011   Unstable angina ([place]) 03/18/2011    Class: Acute   Lung nodule 03/02/2011   Liver lesion 03/02/2011   Other hyperlipidemia 03/02/2011   Abnormal CT of the abdomen 02/23/2011   Non Q wave myocardial infarction ([place]) 02/22/2011    Class: Acute   Cerebrovascular disease 02/22/2011   CAD (coronary artery disease) 02/22/2011   COPD (chronic obstructive pulmonary disease) ([place]) 02/22/2011   Tobacco abuse, 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/22/2011   PCP:  Clinic, [place]:   [id number] - [place], [place] [address] [place] [address] Phone: [phone] Fax: [phone]     Social Determinants of Health (SDOH) Interventions    Readmission Risk Interventions Readmission Risk Prevention Plan 03/21/2021 03/19/2021  Transportation Screening Complete -  HRI or Home Care Consult Complete -  SW Recovery Care/Counseling Consult Complete Complete  Skilled Nursing Facility Complete Complete  Some recent data might be hidden

## 2021-04-01 NOTE — Progress Notes (Signed)
PT Cancellation Note  Patient Details Name: Wayne Meadows MRN: [id number] DOB: 1934-06-30   Cancelled Treatment:    Reason Eval/Treat Not Completed: Medical issues which prohibited therapy. Checked back in on patient after HD. Patient has been nauseated and vomiting. Will hold for today and re-attempt tomorrow.    Tayllor Breitenstein 04/01/2021, 2:11 PM

## 2021-04-01 NOTE — Progress Notes (Signed)
Patient ID: Wayne Meadows, male   DOB: [birth year], 87 y.o.   MRN: [id number] [facility] Progress Note   Assessment/ Plan:   1. ESRD: new to dialysis. Baseline creatinine around 2.0- 2.8. pt admitted w/ creat 4.8 and progressed to 7.3, likely due to cardiorenal mechanism with poor response to diuresis. Decision was made to start patient on dialysis.  IR placed [place] on 2/06 and pt had 1st HD 2/06, and is on HD again this am. VVS has seen and will plan permanent access later this week. Plavix is on hold. Pt is not expected to recover, suspect this is ESRD. CLIP process is underway. Plan as above.  2.  Hypertension: Blood pressures okay on clonidine, amlodipine and metoprolol.  Unable to pull much fluid today due to N/V, will keep even.  3.  Anemia: Without overt blood loss and likely secondary to chronic illness.  Iron stores replete, begin ESA with dialysis. 4.  Hyperphosphatemia: Secondary to acute kidney injury on chronic kidney disease, continue lanthanum for phosphorus binding and monitor with hemodialysis/renal diet.  [name], MD 04/01/2021, 11:01 AM     Subjective:   No c/o's , seen on HD   Objective:   BP (!) 158/70    Pulse 79    Temp 97.6 F (36.4 C) (Temporal)    Resp 13    Ht 6\' 2"  (1.88 m)    Wt 85 kg    SpO2 98%    BMI 24.06 kg/m   Intake/Output Summary (Last 24 hours) at 04/01/2021 1101 Last data filed at 04/01/2021 0900 Gross per 24 hour  Intake 420 ml  Output 1400 ml  Net -980 ml    Weight change: -3.3 kg  Physical Exam: Gen: Comfortably seen in HD CVS: Pulse regular rhythm, normal rate, S1 and S2 normal Resp: Anteriorly clear to auscultation bilaterally, no distinct rales or rhonchi Abd: Soft, obese, nontender Ext: Trace-1+ edema over thighs/back, trace edema over legs with Unna boots Labs: BMET Recent Labs  Lab 03/26/21 0403 03/27/21 0714 03/28/21 0405 03/29/21 1002 03/30/21 0136 03/31/21 0504 04/01/21 0314  NA 136 137 138 135 138 137  141  K 4.7 4.6 4.5 4.7 4.6 4.4 4.4  CL 106 107 109 106 106 104 104  CO2 18* 18* 19* 17* 20* 19* 23  GLUCOSE 82 81 88 114* 97 80 73  BUN 77* 77* 79* 83* 80* 82* 56*  CREATININE 5.78* 6.30* 6.42* 6.95* 7.11* 7.32* 5.73*  CALCIUM 8.3* 8.5* 8.2* 8.0* 8.0* 7.8* 7.9*  PHOS 7.5*  --  8.0*  --  6.9* 7.3* 5.7*    CBC Recent Labs  Lab 03/26/21 0403 03/31/21 0459 04/01/21 0857  WBC 6.3 6.1 8.1  HGB 9.2* 9.5* 9.6*  HCT 29.3* 31.0* 30.8*  MCV 94.8 92.5 93.1  PLT 232 252 275     Medications:     amLODipine  10 mg Oral Daily   aspirin EC  81 mg Oral Daily   Chlorhexidine Gluconate Cloth  6 each Topical Q0600   cloNIDine  0.1 mg Oral Daily   docusate sodium  100 mg Oral BID   ferrous sulfate  325 mg Oral BID WC   furosemide  80 mg Intravenous TID   heparin  5,000 Units Subcutaneous Q8H   heparin sodium (porcine)       hydrocerin   Topical Once per day on Mon Thu   lanthanum  1,000 mg Oral TID WC   levothyroxine  50 mcg Oral Q0600  lidocaine  1 patch Transdermal Q24H   melatonin  3 mg Oral QHS   metoprolol tartrate  25 mg Oral Daily   polyethylene glycol  17 g Oral Daily   pravastatin  40 mg Oral QHS

## 2021-04-01 NOTE — Progress Notes (Signed)
PT Cancellation Note  Patient Details Name: Wayne Meadows MRN: [id number] DOB: [birth year]   Cancelled Treatment:    Reason Eval/Treat Not Completed: Patient at procedure or test/unavailable. Patient at HD this am, will check back later as time allows.    Nawal Burling 04/01/2021, 9:59 AM

## 2021-04-01 NOTE — Progress Notes (Signed)
PROGRESS NOTE  Wayne Meadows:[id number] DOB: November 26, 1934 DOA: 03/17/2021 PCP: [facility]   LOS: 14 days   Brief narrative: Wayne Meadows is a 87 y.o. male with history of chronic kidney disease stage IIIb, CVA and CAD, diabetes mellitus, COPD, hypertension was recently admitted for cellulitis of the lower extremity, discharged on antibiotic presented to hospital with increasing weakness, difficulty ambulating with increasing redness and swelling of his lower extremities.  In the ED, patient was noted to have weeping of his bilateral lower extremities with erythema.  Creatinine had worsened from 2.9 to 4.8 on presentation.   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  Nephrology was consulted during hospitalization for management of fluid overload and renal failure.   2/6 status post right IJ tunneled HD catheter placed by IR today 2/7 Going for HD today  Assessment/Plan:  Principal Problem:   Acute renal failure (ARF) (HCC) Active Problems:   CAD (coronary artery disease)   COPD (chronic obstructive pulmonary disease) (HCC)   Atrial fibrillation (HCC)   Renal cell cancer ([place])   Essential hypertension   Centrilobular emphysema (HCC)   AKI (acute kidney injury) ([place])   Hypothyroidism   ARF (acute renal failure) (HCC)   Pressure injury of skin   Acute on chronic kidney disease stage IV with non-anion gap metabolic acidosis: Renal function continues to worsen. Renal ultrasound with echogenic kidneys consistent with CKD.   No uremic symptoms but again no meaningful recovery of renal functions.   Foley catheter exchanged with good outcome. 2/6 s/p right IJ HD catheter placed by IR  2/7 vascular surgery's input was appreciated.  Plan for follow-up with vein mapping and then recommendation regarding fistula versus graft and timing to OR accordingly  For now we  will hold Plavix until cleared by vascular /postprocedure Plan for HD today Continue IV Lasix    Gross bilateral lower extremity edema anasarca/venous insufficiency.  Patient did have a recent ABI which was normal.  Dopplers were negative for DVT at that time.  Continue Unna boots.  Changed 2/2. 2/7 continue IV Lasix Starting on dialysis as above     CAD  History of coronary stent placement. Chest pain-free 2/7 continue aspirin  Holding Plavix for primary dialysis access placement by vascular        Essential hypertension  BP fluctuates Continue clonidine, amlodipine, metoprolol Once HD started need to monitor volume unloading and BP   Non AG metabolic acidosis: 2/2 progressive ckd with worsening Continue sodiium bicarb , stop once on HD.     Hypothyroidism Continue Synthroid   COPD  Compensated at this time.  Continue bronchodilators.   Chronic anemia likely from renal disease Hemoglobin is a stable at 9-10.  Debility, deconditioning. Patient lives by himself at home and has a walker and cane at home.  Physical therapy has recommended skilled nursing facility on discharge.  Will refer to SNF once medically stable.  Disposition.  Likely to skilled nursing facility on discharge pending clinical improvement.  DVT prophylaxis: heparin injection 5,000 Units Start: 03/18/21 0600   Code Status: Full code  Family Communication: None at bedside   Status is: Inpatient   Remains inpatient appropriate because: Worsening CKD, fluid overload, IV diuretics, needs HD catheter placement, vascular surgery consulted  Consultants: Nephrology IR  Procedures: None  Anti-infectives:  Rocephin IV 1/24>1/26  Anti-infectives (From admission, onward)    Start  Dose/Rate Route Frequency Ordered Stop   03/31/21 0600  ceFAZolin (ANCEF) IVPB 2g/100 mL premix        2 g 200 mL/hr over 30 Minutes Intravenous On call 03/30/21 1118 03/31/21 0907   03/18/21 0200   cefTRIAXone (ROCEPHIN) 2 g in sodium chloride 0.9 % 100 mL IVPB  Status:  Discontinued        2 g 200 mL/hr over 30 Minutes Intravenous Every 24 hours 03/18/21 0158 03/20/21 1542      Subjective: No sob, cp, or any complaints   Vitals:   03/31/21 2054 04/01/21 0540  BP: (!) 154/65 (!) 158/65  Pulse: 73 72  Resp: 18 18  Temp: 98 F (36.7 C) 98.3 F (36.8 C)  SpO2: 94% 96%    Intake/Output Summary (Last 24 hours) at 04/01/2021 0821 Last data filed at 04/01/2021 0600 Gross per 24 hour  Intake 760 ml  Output 1900 ml  Net -1140 ml   Filed Weights   03/31/21 0500 03/31/21 1501 03/31/21 1711  Weight: 90.4 kg 87.1 kg 86.5 kg   Body mass index is 24.48 kg/m.   Physical Exam: Calm, nad Cta no wheezing Reg s1/s2 no gallop Soft benign +bs Dressing LE b/l Aaoxox3   Data Review: I have personally reviewed the following laboratory data and studies,  CBC: Recent Labs  Lab 03/26/21 0403 03/31/21 0459  WBC 6.3 6.1  HGB 9.2* 9.5*  HCT 29.3* 31.0*  MCV 94.8 92.5  PLT 232 638   Basic Metabolic Panel: Recent Labs  Lab 03/26/21 0403 03/27/21 0714 03/28/21 0405 03/29/21 1002 03/30/21 0136 03/31/21 0504 04/01/21 0314  NA 136   < > 138 135 138 137 141  K 4.7   < > 4.5 4.7 4.6 4.4 4.4  CL 106   < > 109 106 106 104 104  CO2 18*   < > 19* 17* 20* 19* 23  GLUCOSE 82   < > 88 114* 97 80 73  BUN 77*   < > 79* 83* 80* 82* 56*  CREATININE 5.78*   < > 6.42* 6.95* 7.11* 7.32* 5.73*  CALCIUM 8.3*   < > 8.2* 8.0* 8.0* 7.8* 7.9*  MG 2.4  --   --   --   --   --   --   PHOS 7.5*  --  8.0*  --  6.9* 7.3* 5.7*   < > = values in this interval not displayed.   Liver Function Tests: Recent Labs  Lab 03/26/21 0403 03/28/21 0405 03/30/21 0136 03/31/21 0504 04/01/21 0314  ALBUMIN 2.4* 2.6* 2.1* 2.0* 2.2*   No results for input(s): LIPASE, AMYLASE in the last 168 hours. No results for input(s): AMMONIA in the last 168 hours. Cardiac Enzymes: No results for input(s): CKTOTAL,  CKMB, CKMBINDEX, TROPONINI in the last 168 hours.  BNP (last 3 results) Recent Labs    03/07/21 1714 03/08/21 0509 03/17/21 2155  BNP 931.2* 915.5* 280.9*    ProBNP (last 3 results) No results for input(s): PROBNP in the last 8760 hours.  CBG: No results for input(s): GLUCAP in the last 168 hours. No results found for this or any previous visit (from the past 240 hour(s)).     Studies: IR Fluoro Guide CV Line Right  Result Date: 03/31/2021 INDICATION: 87 year old male with progressive renal dysfunction in need of acute hemodialysis. He presents for EXAM: TUNNELED CENTRAL VENOUS HEMODIALYSIS CATHETER PLACEMENT WITH ULTRASOUND AND FLUOROSCOPIC GUIDANCE MEDICATIONS: 2 g Ancef. The antibiotic was given in an  appropriate time interval prior to skin puncture. ANESTHESIA/SEDATION: Moderate (conscious) sedation was employed during this procedure. A total of Versed 1 mg and Fentanyl 25 mcg was administered intravenously. Moderate Sedation Time: 17 minutes. The patient's level of consciousness and vital signs were monitored continuously by radiology nursing throughout the procedure under my direct supervision. FLUOROSCOPY TIME:  2 mGy, air kerma COMPLICATIONS: None immediate. PROCEDURE: Informed written consent was obtained from the patient after a discussion of the risks, benefits, and alternatives to treatment. Questions regarding the procedure were encouraged and answered. The right neck and chest were prepped with chlorhexidine in a sterile fashion, and a sterile drape was applied covering the operative field. Maximum barrier sterile technique with sterile gowns and gloves were used for the procedure. A timeout was performed prior to the initiation of the procedure. After creating a small venotomy incision, a micropuncture kit was utilized to access the right internal jugular vein under direct, real-time ultrasound guidance after the overlying soft tissues were anesthetized with 1% lidocaine with  epinephrine. Ultrasound image documentation was performed. The microwire was kinked to measure appropriate catheter length. A stiff Glidewire was advanced to the level of the IVC and the micropuncture sheath was exchanged for a peel-away sheath. A Palindrome tunneled hemodialysis catheter measuring 23 cm from tip to cuff was tunneled in a retrograde fashion from the anterior chest wall to the venotomy incision. The catheter was then placed through the peel-away sheath with tips ultimately positioned within the superior aspect of the right atrium. Final catheter positioning was confirmed and documented with a spot radiographic image. The catheter aspirates and flushes normally. The catheter was flushed with appropriate volume heparin dwells. The catheter exit site was secured with a 0-Prolene retention suture. The venotomy incision was closed with an interrupted 4-0 Vicryl, Dermabond and Steri-strips. Dressings were applied. The patient tolerated the procedure well without immediate post procedural complication. IMPRESSION: Successful placement of 23 cm tip to cuff tunneled hemodialysis catheter via the right internal jugular vein with tips terminating within the superior aspect of the right atrium. The catheter is ready for immediate use. Electronically Signed   By: Jacqulynn Cadet M.D.   On: 03/31/2021 10:48    Total time spent: 35 minutes  Nolberto Hanlon, MD  [facility] 04/01/2021  If 7PM-7AM, please contact night-coverage

## 2021-04-02 ENCOUNTER — Inpatient Hospital Stay (HOSPITAL_COMMUNITY): Payer: No Typology Code available for payment source

## 2021-04-02 DIAGNOSIS — N186 End stage renal disease: Secondary | ICD-10-CM | POA: Diagnosis not present

## 2021-04-02 DIAGNOSIS — N179 Acute kidney failure, unspecified: Secondary | ICD-10-CM | POA: Diagnosis not present

## 2021-04-02 DIAGNOSIS — I2510 Atherosclerotic heart disease of native coronary artery without angina pectoris: Secondary | ICD-10-CM | POA: Diagnosis not present

## 2021-04-02 DIAGNOSIS — I4891 Unspecified atrial fibrillation: Secondary | ICD-10-CM | POA: Diagnosis not present

## 2021-04-02 DIAGNOSIS — J432 Centrilobular emphysema: Secondary | ICD-10-CM | POA: Diagnosis not present

## 2021-04-02 LAB — RENAL FUNCTION PANEL
Albumin: 2.1 g/dL — ABNORMAL LOW (ref 3.5–5.0)
Anion gap: 16 — ABNORMAL HIGH (ref 5–15)
BUN: 33 mg/dL — ABNORMAL HIGH (ref 8–23)
CO2: 17 mmol/L — ABNORMAL LOW (ref 22–32)
Calcium: 7.9 mg/dL — ABNORMAL LOW (ref 8.9–10.3)
Chloride: 103 mmol/L (ref 98–111)
Creatinine, Ser: 4.33 mg/dL — ABNORMAL HIGH (ref 0.61–1.24)
GFR, Estimated: 13 mL/min — ABNORMAL LOW (ref >60)
Glucose, Bld: 71 mg/dL (ref 70–99)
Phosphorus: 4.7 mg/dL — ABNORMAL HIGH (ref 2.5–4.6)
Potassium: 5.4 mmol/L — ABNORMAL HIGH (ref 3.5–5.1)
Sodium: 136 mmol/L (ref 135–145)

## 2021-04-02 MED ORDER — SODIUM CHLORIDE 0.9 % IV SOLN: INTRAVENOUS | Status: AC

## 2021-04-02 MED ORDER — SODIUM ZIRCONIUM CYCLOSILICATE 10 G PO PACK
10.0000 g | PACK | Freq: Three times a day (TID) | ORAL | Status: AC
  Administered 2021-04-02 (×2): 10 g via ORAL
  Filled 2021-04-02 (×2): qty 1

## 2021-04-02 NOTE — Progress Notes (Signed)
Met with pt at bedside to discuss out-pt HD arrangements. Pt has been accepted to [facility] on MWF schedule. Pt will need to arrive at 12:40 for 1:00 chair time. For first appt, pt will need to arrive at 12:00 to complete paperwork prior to treatment. Pt agreeable to plan and provided schedule letter with details. Arrangements added to AVS as well. Pt requested that navigator speak to pt's dtr as well. Spoke to pt's dtr via phone and provided details of out-pt HD arrangements. Dtr agreeable to plan. CSW advised of MWF schedule and confirms snf can meet pt's HD needs. Will follow and assist.   Melven Sartorius Renal Navigator [phone]

## 2021-04-02 NOTE — Progress Notes (Addendum)
Patient ID: Wayne Meadows, male   DOB: 05/02/1934, 87 y.o.   MRN: [id number]  [facility] Progress Note   Assessment/ Plan:   ESRD: new to dialysis. Baseline creatinine around 2.0- 2.8, pt was admitted w/ creat 4.8 and progressed to 7.3, likely due to cardiorenal mechanism with poor response to diuresis. Decision was made to start patient on HD.  IR placed [place] on 2/06 and pt had 1st HD 2/06 and 2nd HD 2/07. VVS has seen and will plan permanent access probably tomorrow am. Plavix is on hold. Pt is not expected to recover renal function, suspect this is ESRD. CLIP process is underway. Next HD tomorrow.  Nausea/ dry heaves: started on HD yesterday. N/V usually would go away with new dialysis start. Think we may have dried him out too much w/ IV lasix and now w/ HD. Will give some fluid back w/ 1L bolus overnight and dc IV lasix and keep even on HD tomorrow.  Hypertension: Blood pressures okay on clonidine, amlodipine and metoprolol. Vol overload has resolved, see above alos.  Anemia ckd: Without overt blood loss and likely secondary to chronic illness.  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 No abd pain, no CP , no fevers.    Objective:   BP (!) 166/60    Pulse 81    Temp 98.6 F (37 C) (Oral)    Resp 16    Ht 6\' 2"  (1.88 m)    Wt 87.9 kg    SpO2 94%    BMI 24.88 kg/m   Intake/Output Summary (Last 24 hours) at 04/02/2021 1559 Last data filed at 04/02/2021 1300 Gross per 24 hour  Intake 600 ml  Output 525 ml  Net 75 ml    Weight change: -2.1 kg  Physical Exam: Gen: Comfortably seen in HD CVS: Pulse regular rhythm, normal rate, S1 and S2 normal Resp: Anteriorly clear to auscultation bilaterally, no distinct rales or rhonchi Abd: Soft, obese,  nontender Ext: trace edema pretib, legs are wrapped, no hip or UE edema, resolved Labs: BMET Recent Labs  Lab 03/27/21 0714 03/28/21 0405 03/29/21 1002 03/30/21 0136 03/31/21 0504 04/01/21 0314 04/02/21 0305  NA 137 138 135 138 137 141 136  K 4.6 4.5 4.7 4.6 4.4 4.4 5.4*  CL 107 109 106 106 104 104 103  CO2 18* 19* 17* 20* 19* 23 17*  GLUCOSE 81 88 114* 97 80 73 71  BUN 77* 79* 83* 80* 82* 56* 33*  CREATININE 6.30* 6.42* 6.95* 7.11* 7.32* 5.73* 4.33*  CALCIUM 8.5* 8.2* 8.0* 8.0* 7.8* 7.9* 7.9*  PHOS  --  8.0*  --  6.9* 7.3* 5.7* 4.7*    CBC Recent Labs  Lab 03/31/21 0459 04/01/21 0857  WBC 6.1 8.1  HGB 9.5* 9.6*  HCT 31.0* 30.8*  MCV 92.5 93.1  PLT 252 275     Medications:     amLODipine  10 mg Oral Daily   aspirin EC  81 mg Oral Daily   Chlorhexidine Gluconate Cloth  6 each Topical Q0600   cloNIDine  0.1 mg Oral Daily   docusate sodium  100 mg Oral BID   ferrous sulfate  325 mg Oral BID WC   furosemide  80  mg Intravenous TID   heparin  5,000 Units Subcutaneous Q8H   hydrocerin   Topical Once per day on Mon Thu   lanthanum  1,000 mg Oral TID WC   levothyroxine  50 mcg Oral Q0600   lidocaine  1 patch Transdermal Q24H   melatonin  3 mg Oral QHS   metoprolol tartrate  25 mg Oral Daily   polyethylene glycol  17 g Oral Daily   pravastatin  40 mg Oral QHS   sodium zirconium cyclosilicate  10 g Oral TID

## 2021-04-02 NOTE — TOC Progression Note (Addendum)
Transition of Care [facility]) - Initial/Assessment Note    Patient Details  Name: Wayne Meadows MRN: [id number] Date of Birth: 09-01-1934  Transition of Care [facility]) CM/SW Contact:    Milinda Antis, [place] Phone Number: 04/02/2021, 10:50 AM  Clinical Narrative:                 CSW contacted Janie with admissions at [facility] to inquire about whether the facility can the patient under his Medicare benefits and is waiting a returned call.  CSW received a returned call from [facility].  The facility can accept the patient when he is medically ready.  The patient will receive HD on MWF @ [place] arrival time 12pm the first day (12:40 everyday after) with a seat time of 1pm.   Expected Discharge Plan: [place] Barriers to Discharge: No Barriers Identified   Patient Goals and CMS Choice Patient states their goals for this hospitalization and ongoing recovery are:: To get stronger CMS Medicare.gov Compare Post Acute Care list provided to:: Patient Choice offered to / list presented to : Patient  Expected Discharge Plan and Services Expected Discharge Plan: [place]   Discharge Planning Services: CM Consult Post Acute Care Choice: [place] Living arrangements for the past 2 months: Single Family Home                                      Prior Living Arrangements/Services Living arrangements for the past 2 months: Single Family Home Lives with:: Self Patient language and need for interpreter reviewed:: No Do you feel safe going back to the place where you live?: Yes        Care giver support system in place?: Yes (comment)      Activities of Daily Living Home Assistive Devices/Equipment: Walker (specify type) ADL Screening (condition at time of admission) Patient's cognitive ability adequate to safely complete daily activities?: Yes Is the patient deaf or have difficulty hearing?: No Does the patient have difficulty seeing,  even when wearing glasses/contacts?: No Does the patient have difficulty concentrating, remembering, or making decisions?: No Patient able to express need for assistance with ADLs?: Yes Does the patient have difficulty dressing or bathing?: Yes Independently performs ADLs?: No Communication: Independent Dressing (OT): Needs assistance Grooming: Independent Feeding: Independent Bathing: Needs assistance Toileting: Needs assistance In/Out Bed: Needs assistance Does the patient have difficulty walking or climbing stairs?: No Weakness of Legs: Both Weakness of Arms/Hands: None  Permission Sought/Granted Permission sought to share information with : Case Manager                Emotional Assessment Appearance:: Appears stated age     Orientation: : Oriented to Self, Oriented to Place, Oriented to  Time, Oriented to Situation      Admission diagnosis:  ARF (acute renal failure) ([place]) [N17.9] Nausea & vomiting [R11.2] Acute renal failure (ARF) (HCC) [N17.9] AKI (acute kidney injury) ([place]) [N17.9] Patient Active Problem List   Diagnosis Date Noted   Pressure injury of skin 03/30/2021   ARF (acute renal failure) ([place]) 03/18/2021   Acute renal failure (ARF) ([place]) 03/17/2021   Abnormal echocardiogram 03/08/2021   Proteinuria 03/07/2021   AKI (acute kidney injury) ([place]) 03/05/2021   Hypothyroidism 03/05/2021   Anxiety 03/05/2021   Rash 03/05/2021   Recurrent cellulitis of lower leg 03/04/2021   Chronic pancreatitis ([place]) 12/24/2020   Abnormal finding on GI tract  imaging    Acute pancreatitis 08/12/2020   Nausea and vomiting in adult 06/22/2020   Gastric lesion    Esophageal stricture    Refractory nausea and vomiting 06/20/2020   Sepsis, unspecified organism ([place]) 04/11/2016   COPD exacerbation ([place]) 04/11/2016   CKD (chronic kidney disease), stage III ([place]) 04/11/2016   Hyperbilirubinemia 04/11/2016   Elevated lactic acid level    Renal insufficiency    Segmental  colitis ([place]) 09/04/2015   Generalized abdominal pain 09/04/2015   Noninfectious gastroenteritis, unspecified    Benign neoplasm of transverse colon    Benign neoplasm of colon    Bright red blood per rectum    Acute blood loss anemia    Dysphagia    Renal cell cancer (HCC)    Essential hypertension    Centrilobular emphysema (HCC)    Blood in stool 08/19/2015   Rectal bleeding 08/19/2015   Pleuritic chest pain 04/08/2014   CAP (community acquired pneumonia) 04/08/2014   Community acquired pneumonia of left lower lobe of lung 04/08/2014   Overweight (BMI 25.0-29.9) 04/08/2014   Healthcare-associated pneumonia 10/26/2011   Cholelithiasis 04/26/2011   Atrial fibrillation ([place]) 04/26/2011   Chest pain 03/19/2011   Hypertension 03/19/2011   Unstable angina ([place]) 03/18/2011    Class: Acute   Lung nodule 03/02/2011   Liver lesion 03/02/2011   Other hyperlipidemia 03/02/2011   Abnormal CT of the abdomen 02/23/2011   Non Q wave myocardial infarction ([place]) 02/22/2011    Class: Acute   Cerebrovascular disease 02/22/2011   CAD (coronary artery disease) 02/22/2011   COPD (chronic obstructive pulmonary disease) ([place]) 02/22/2011   Tobacco abuse, 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/22/2011   PCP:  Clinic, [place]:   [id number] - [place], [address]. AT [address] [place] [address] Phone: [phone] Fax: [phone]     Social Determinants of Health (SDOH) Interventions    Readmission Risk Interventions Readmission Risk Prevention Plan 03/21/2021 03/19/2021  Transportation Screening Complete -  HRI or Home Care Consult Complete -  SW Recovery Care/Counseling Consult Complete Complete  Skilled Nursing Facility Complete Complete  Some recent data might be hidden

## 2021-04-02 NOTE — Progress Notes (Addendum)
°  Daily Progress Note   ASSESSMENT/PLAN:   Imaging reviewed. Will schedule pt for right arm AV fistula tomorrow.  Will discuss this with him further at bedside shortly   Please make NPO tonight.  Dialysis planned after fistula.  Cassandria Santee MD MS [facility] [phone] 04/02/2021  3:18 PM

## 2021-04-02 NOTE — Progress Notes (Signed)
Physical Therapy Treatment Patient Details Name: Wayne Meadows MRN: [id number] DOB: May 12, 1934 Today's Date: 04/02/2021   History of Present Illness 87 y.o. male recently admitted for cellulitis of the lower extremity discharged on 1/17 on antibiotics. Pt presented to hospital with increasing weakness, difficulty ambulating with increasing redness and swelling of his lower extremities.  In the ED, patient was noted to have a weeping of his bilateral lower extremities with erythema.  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  PMH significant for HTN, CAD with stents, history of CVA, COPD (not on home O2), arthritis, tobacco abuse in remission presented in the ED with complaints of pain, swelling and redness of both lower extremities. Dx of recurrent cellulitis and ARF.    PT Comments    Patient limited this session by nausea/dry heaving. Patient willing to get OOB to chair despite nausea. Patient required minA for sit to stand from elevated surface and minA to take pivotal steps towards recliner with RW. Patient willing to attempt further mobility next session once nausea improves. Continue to recommend SNF for ongoing Physical Therapy.       Recommendations for follow up therapy are one component of a multi-disciplinary discharge planning process, led by the attending physician.  Recommendations may be updated based on patient status, additional functional criteria and insurance authorization.  Follow Up Recommendations  Skilled nursing-short term rehab (<3 hours/day)     Assistance Recommended at Discharge Frequent or constant Supervision/Assistance  Patient can return home with the following Assistance with cooking/housework;Assist for transportation;Help with stairs or ramp for entrance;A lot of help with bathing/dressing/bathroom;A lot of help with walking and/or transfers    Equipment Recommendations  Rolling Woodie Degraffenreid (2 wheels)    Recommendations for Other Services       Precautions / Restrictions Precautions Precautions: Fall Precaution Comments: monitor sats Restrictions Weight Bearing Restrictions: No     Mobility  Bed Mobility Overal bed mobility: Needs Assistance Bed Mobility: Supine to Sit     Supine to sit: Min assist, HOB elevated          Transfers Overall transfer level: Needs assistance Equipment used: Rolling Irini Leet (2 wheels) Transfers: Sit to/from Stand Sit to Stand: Min assist, From elevated surface   Step pivot transfers: Min assist       General transfer comment: minA to rise from elevated bed surface and minA to step pivot towards recliner for balance and RW management    Ambulation/Gait               General Gait Details: deferred due to nausea   Stairs             Wheelchair Mobility    Modified Rankin (Stroke Patients Only)       Balance Overall balance assessment: Needs assistance Sitting-balance support: Feet supported Sitting balance-Leahy Scale: Fair     Standing balance support: Bilateral upper extremity supported, Reliant on assistive device for balance Standing balance-Leahy Scale: Poor                              Cognition Arousal/Alertness: Awake/alert Behavior During Therapy: WFL for tasks assessed/performed Overall Cognitive Status: Within Functional Limits for tasks assessed  Exercises      General Comments General comments (skin integrity, edema, etc.): VSS on 2L O2 [name]      Pertinent Vitals/Pain Pain Assessment Pain Assessment: Faces Faces Pain Scale: Hurts little more Pain Location: back pain "chronic" Pain Descriptors / Indicators: Discomfort, Grimacing Pain Intervention(s): Monitored during session    Home Living                          Prior Function            PT  Goals (current goals can now be found in the care plan section) Acute Rehab PT Goals Patient Stated Goal: to get better PT Goal Formulation: With patient Time For Goal Achievement: 04/16/21 Potential to Achieve Goals: Good Progress towards PT goals: Progressing toward goals    Frequency    Min 2X/week      PT Plan Current plan remains appropriate    Co-evaluation              AM-PAC PT "6 Clicks" Mobility   Outcome Measure  Help needed turning from your back to your side while in a flat bed without using bedrails?: A Little Help needed moving from lying on your back to sitting on the side of a flat bed without using bedrails?: A Little Help needed moving to and from a bed to a chair (including a wheelchair)?: A Little Help needed standing up from a chair using your arms (e.g., wheelchair or bedside chair)?: A Little Help needed to walk in hospital room?: A Lot Help needed climbing 3-5 steps with a railing? : Total 6 Click Score: 15    End of Session Equipment Utilized During Treatment: Gait belt Activity Tolerance: Patient tolerated treatment well Patient left: in chair;with call bell/phone within reach Nurse Communication: Mobility status PT Visit Diagnosis: Difficulty in walking, not elsewhere classified (R26.2);Pain Pain - Right/Left: Right Pain - part of body: Ankle and joints of foot     Time: 6568-1275 PT Time Calculation (min) (ACUTE ONLY): 24 min  Charges:  $Therapeutic Activity: 23-37 mins                     Ashlan Dignan A. Gilford Rile PT, DPT Acute Rehabilitation Services Pager [phone] Office [phone]    Linna Hoff 04/02/2021, 3:42 PM

## 2021-04-02 NOTE — Progress Notes (Signed)
°PROGRESS NOTE ° ° ° °Wayne Meadows  MRN:[id number] DOB: 02/19/1935 DOA: 03/17/2021 °PCP: [facility] ° ° °Brief Narrative:  °Wayne Meadows is a 87 y.o. male with history of chronic kidney disease stage IIIb, CVA and CAD, diabetes mellitus, COPD, hypertension was recently admitted for cellulitis of the lower extremity, discharged on antibiotic presented to hospital with increasing weakness, difficulty ambulating with increasing redness and swelling of his lower extremities.  In the ED, patient was noted to have weeping of his bilateral lower extremities with erythema.  Creatinine had worsened from 2.9 to 4.8 on presentation.   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  Nephrology was consulted during hospitalization for management of fluid overload and renal failure. ° °2/6 status post right IJ tunneled HD catheter placed by IR today °2/7 tolerated HD °2/8 complaining of nausea ° ° °Assessment & Plan: °  °Acute on chronic kidney disease stage IV with non-anion gap metabolic acidosis: °Urine output continues to decline °Nephrology following, appreciate insight recommendations °Right IJ HD catheter placed by IR -likely transition to more permanent dialysis access with vascular surgery, vein mapping ongoing.  Scheduling of procedure per vascular surgery.  Plavix currently being held tentatively. °  °Gross bilateral lower extremity edema anasarca/venous insufficiency.   °Secondary to above °Improving with fluid management with dialysis ° °CAD, chronic °History of coronary stent placement. °Continue aspirin; Holding Plavix for primary dialysis access placement by vascular  ° °Essential hypertension  °Continue clonidine, amlodipine, metoprolol °Once HD started need to monitor volume unloading and BP -May need to wean down home blood pressure medications pending postdialysis pressures °   °Hypothyroidism, chronic °Continue Synthroid °  °COPD, chronic  °Compensated at this time.  Continue bronchodilators. °  °Chronic anemia, of chronic disease, stable °Appears to be at baseline around 9-10. °  °Debility, deconditioning °Ambulatory dysfunction, chronic °Uses a walker and cane at home  -PT recommending SNF  °  °DVT prophylaxis: Heparin °Code Status: Full °Family Communication: None present ° °Status is: Inpatient ° °Dispo: The patient is from: Home °             Anticipated d/c is to: SNF °             Anticipated d/c date is: 40 to 72 hours °             Patient currently not medically stable for discharge ° °Consultants:  °Nephrology, vascular surgery ° °Antimicrobials:  °None ° °Subjective: °No acute issues or events overnight, patient's only complaint is nausea well controlled with Zofran overnight and again after medications this morning on an empty stomach ° °Objective: °Vitals:  ° 04/01/21 1703 04/01/21 2038 04/02/21 0505 04/02/21 0505  °BP: (!) 155/66 (!) 153/58  (!) 158/63  °Pulse: 72 74  72  °Resp: 16 18  18  °Temp: 98.3 °F (36.8 °C) 98.7 °F (37.1 °C)  98.5 °F (36.9 °C)  °TempSrc:  Oral  Oral  °SpO2: 94% 94%  94%  °Weight:   87.9 kg   °Height:      ° ° °Intake/Output Summary (Last 24 hours) at 04/02/2021 0758 °Last data filed at 04/01/2021 2337 °Gross per 24 hour  °Intake 240 ml  °Output 504 ml  °Net -264 ml  ° °Filed Weights  ° 04/01/21 0847 04/01/21 1124 04/02/21 0505  °Weight: 85 kg 85 kg 87.9 kg  ° ° °Examination: ° °General exam:   exam: Appears calm and comfortable  Respiratory system: Clear to auscultation. Respiratory effort normal. Cardiovascular system: S1 & S2 heard, RRR. No JVD, murmurs, rubs, gallops or clicks. No pedal edema. Gastrointestinal system: Abdomen is nondistended, soft and nontender. No organomegaly or masses felt. Normal bowel sounds heard. Central nervous system: Alert and oriented. No focal neurological deficits. Extremities: Symmetric 5 x 5 power. Skin: No rashes,  lesions or ulcers Psychiatry: Judgement and insight appear normal. Mood & affect appropriate.     Data Reviewed: I have personally reviewed following labs and imaging studies  CBC: Recent Labs  Lab 03/31/21 0459 04/01/21 0857  WBC 6.1 8.1  HGB 9.5* 9.6*  HCT 31.0* 30.8*  MCV 92.5 93.1  PLT 252 322   Basic Metabolic Panel: Recent Labs  Lab 03/28/21 0405 03/29/21 1002 03/30/21 0136 03/31/21 0504 04/01/21 0314 04/02/21 0305  NA 138 135 138 137 141 136  K 4.5 4.7 4.6 4.4 4.4 5.4*  CL 109 106 106 104 104 103  CO2 19* 17* 20* 19* 23 17*  GLUCOSE 88 114* 97 80 73 71  BUN 79* 83* 80* 82* 56* 33*  CREATININE 6.42* 6.95* 7.11* 7.32* 5.73* 4.33*  CALCIUM 8.2* 8.0* 8.0* 7.8* 7.9* 7.9*  PHOS 8.0*  --  6.9* 7.3* 5.7* 4.7*   GFR: Estimated Creatinine Clearance: 14.8 mL/min (A) (by C-G formula based on SCr of 4.33 mg/dL (H)). Liver Function Tests: Recent Labs  Lab 03/28/21 0405 03/30/21 0136 03/31/21 0504 04/01/21 0314 04/02/21 0305  ALBUMIN 2.6* 2.1* 2.0* 2.2* 2.1*   No results for input(s): LIPASE, AMYLASE in the last 168 hours. No results for input(s): AMMONIA in the last 168 hours. Coagulation Profile: No results for input(s): INR, PROTIME in the last 168 hours. Cardiac Enzymes: No results for input(s): CKTOTAL, CKMB, CKMBINDEX, TROPONINI in the last 168 hours. BNP (last 3 results) No results for input(s): PROBNP in the last 8760 hours. HbA1C: No results for input(s): HGBA1C in the last 72 hours. CBG: No results for input(s): GLUCAP in the last 168 hours. Lipid Profile: No results for input(s): CHOL, HDL, LDLCALC, TRIG, CHOLHDL, LDLDIRECT in the last 72 hours. Thyroid Function Tests: No results for input(s): TSH, T4TOTAL, FREET4, T3FREE, THYROIDAB in the last 72 hours. Anemia Panel: No results for input(s): VITAMINB12, FOLATE, FERRITIN, TIBC, IRON, RETICCTPCT in the last 72 hours. Sepsis Labs: No results for input(s): PROCALCITON, LATICACIDVEN in the last 168  hours.  No results found for this or any previous visit (from the past 240 hour(s)).       Radiology Studies: IR Fluoro Guide CV Line Right  Result Date: 03/31/2021 INDICATION: 87 year old male with progressive renal dysfunction in need of acute hemodialysis. He presents for EXAM: TUNNELED CENTRAL VENOUS HEMODIALYSIS CATHETER PLACEMENT WITH ULTRASOUND AND FLUOROSCOPIC GUIDANCE MEDICATIONS: 2 g Ancef. The antibiotic was given in an appropriate time interval prior to skin puncture. ANESTHESIA/SEDATION: Moderate (conscious) sedation was employed during this procedure. A total of Versed 1 mg and Fentanyl 25 mcg was administered intravenously. Moderate Sedation Time: 17 minutes. The patient's level of consciousness and vital signs were monitored continuously by radiology nursing throughout the procedure under my direct supervision. FLUOROSCOPY TIME:  2 mGy, air kerma COMPLICATIONS: None immediate. PROCEDURE: Informed written consent was obtained from the patient after a discussion of the risks, benefits, and alternatives to treatment. Questions regarding the procedure were encouraged and answered. The right neck and chest were prepped with chlorhexidine in a sterile fashion, and a sterile drape was applied covering the  field. Maximum barrier sterile technique with sterile gowns and gloves were used for the procedure. A timeout was performed prior to the initiation of the procedure. After creating a small venotomy incision, a micropuncture kit was utilized to access the right internal jugular vein under direct, real-time ultrasound guidance after the overlying soft tissues were anesthetized with 1% lidocaine with epinephrine. Ultrasound image documentation was performed. The microwire was kinked to measure appropriate catheter length. A stiff Glidewire was advanced to the level of the IVC and the micropuncture sheath was exchanged for a peel-away sheath. A Palindrome tunneled hemodialysis catheter  measuring 23 cm from tip to cuff was tunneled in a retrograde fashion from the anterior chest wall to the venotomy incision. The catheter was then placed through the peel-away sheath with tips ultimately positioned within the superior aspect of the right atrium. Final catheter positioning was confirmed and documented with a spot radiographic image. The catheter aspirates and flushes normally. The catheter was flushed with appropriate volume heparin dwells. The catheter exit site was secured with a 0-Prolene retention suture. The venotomy incision was closed with an interrupted 4-0 Vicryl, Dermabond and Steri-strips. Dressings were applied. The patient tolerated the procedure well without immediate post procedural complication. IMPRESSION: Successful placement of 23 cm tip to cuff tunneled hemodialysis catheter via the right internal jugular vein with tips terminating within the superior aspect of the right atrium. The catheter is ready for immediate use. Electronically Signed   By: Heath  McCullough M.D.   On: 03/31/2021 10:48   ° ° ° ° ° °Scheduled Meds: ° amLODipine  10 mg Oral Daily  ° aspirin EC  81 mg Oral Daily  ° Chlorhexidine Gluconate Cloth  6 each Topical Q0600  ° cloNIDine  0.1 mg Oral Daily  ° docusate sodium  100 mg Oral BID  ° ferrous sulfate  325 mg Oral BID WC  ° furosemide  80 mg Intravenous TID  ° heparin  5,000 Units Subcutaneous Q8H  ° hydrocerin   Topical Once per day on Mon Thu  ° lanthanum  1,000 mg Oral TID WC  ° levothyroxine  50 mcg Oral Q0600  ° lidocaine  1 patch Transdermal Q24H  ° melatonin  3 mg Oral QHS  ° metoprolol tartrate  25 mg Oral Daily  ° polyethylene glycol  17 g Oral Daily  ° pravastatin  40 mg Oral QHS  ° °Continuous Infusions: ° promethazine (PHENERGAN) injection (IM or IVPB) 12.5 mg (03/27/21 1740)  ° ° ° LOS: 15 days  ° °Time spent: 40min ° °Kesean C , DO °[facility] ° °If 7PM-7AM, please contact night-coverage [email or web address] ° °04/02/2021, 7:58 AM    ° ° ° °

## 2021-04-02 NOTE — Progress Notes (Signed)
BUE vein mapping has been completed.  Results can be found under chart review under CV PROC. 04/02/2021 1:15 PM Lakevia Perris RVT, RDMS

## 2021-04-03 ENCOUNTER — Inpatient Hospital Stay (HOSPITAL_COMMUNITY): Payer: No Typology Code available for payment source | Admitting: Anesthesiology

## 2021-04-03 ENCOUNTER — Encounter (HOSPITAL_COMMUNITY)
Admission: EM | Disposition: A | Discharge status: Skilled Nursing Facility | Payer: Self-pay | Source: Home / Self Care | Attending: Internal Medicine

## 2021-04-03 ENCOUNTER — Other Ambulatory Visit: Payer: Self-pay

## 2021-04-03 ENCOUNTER — Encounter (HOSPITAL_COMMUNITY): Payer: Self-pay | Admitting: Internal Medicine

## 2021-04-03 DIAGNOSIS — I132 Hypertensive heart and chronic kidney disease with heart failure and with stage 5 chronic kidney disease, or end stage renal disease: Secondary | ICD-10-CM

## 2021-04-03 DIAGNOSIS — I4891 Unspecified atrial fibrillation: Secondary | ICD-10-CM | POA: Diagnosis not present

## 2021-04-03 DIAGNOSIS — N186 End stage renal disease: Secondary | ICD-10-CM

## 2021-04-03 DIAGNOSIS — J432 Centrilobular emphysema: Secondary | ICD-10-CM | POA: Diagnosis not present

## 2021-04-03 DIAGNOSIS — N179 Acute kidney failure, unspecified: Secondary | ICD-10-CM | POA: Diagnosis not present

## 2021-04-03 DIAGNOSIS — I2510 Atherosclerotic heart disease of native coronary artery without angina pectoris: Secondary | ICD-10-CM

## 2021-04-03 DIAGNOSIS — Z992 Dependence on renal dialysis: Secondary | ICD-10-CM

## 2021-04-03 HISTORY — PX: AV FISTULA PLACEMENT: SHX1204

## 2021-04-03 HISTORY — PX: THROMBECTOMY W/ EMBOLECTOMY: SHX2507

## 2021-04-03 LAB — RENAL FUNCTION PANEL
Albumin: 2.1 g/dL — ABNORMAL LOW (ref 3.5–5.0)
Anion gap: 15 (ref 5–15)
BUN: 40 mg/dL — ABNORMAL HIGH (ref 8–23)
CO2: 21 mmol/L — ABNORMAL LOW (ref 22–32)
Calcium: 8 mg/dL — ABNORMAL LOW (ref 8.9–10.3)
Chloride: 103 mmol/L (ref 98–111)
Creatinine, Ser: 5.29 mg/dL — ABNORMAL HIGH (ref 0.61–1.24)
GFR, Estimated: 10 mL/min — ABNORMAL LOW (ref >60)
Glucose, Bld: 77 mg/dL (ref 70–99)
Phosphorus: 5.8 mg/dL — ABNORMAL HIGH (ref 2.5–4.6)
Potassium: 4 mmol/L (ref 3.5–5.1)
Sodium: 139 mmol/L (ref 135–145)

## 2021-04-03 LAB — SURGICAL PCR SCREEN
MRSA, PCR: POSITIVE — AB
Staphylococcus aureus: POSITIVE — AB

## 2021-04-03 SURGERY — ARTERIOVENOUS (AV) FISTULA CREATION
Anesthesia: Monitor Anesthesia Care | Site: Arm Upper | Laterality: Right

## 2021-04-03 MED ORDER — FENTANYL CITRATE (PF) 100 MCG/2ML IJ SOLN: 50.0000 ug | Freq: Once | INTRAMUSCULAR | Status: AC

## 2021-04-03 MED ORDER — GLYCOPYRROLATE PF 0.2 MG/ML IJ SOSY
PREFILLED_SYRINGE | INTRAMUSCULAR | Status: AC
  Filled 2021-04-03: qty 1

## 2021-04-03 MED ORDER — EPHEDRINE SULFATE-NACL 50-0.9 MG/10ML-% IV SOSY
PREFILLED_SYRINGE | INTRAVENOUS | Status: DC | PRN
  Administered 2021-04-03: 5 mg via INTRAVENOUS

## 2021-04-03 MED ORDER — MEPIVACAINE HCL (PF) 2 % IJ SOLN
INTRAMUSCULAR | Status: DC | PRN
  Administered 2021-04-03: 20 mL

## 2021-04-03 MED ORDER — PROPOFOL 500 MG/50ML IV EMUL
INTRAVENOUS | Status: DC | PRN
  Administered 2021-04-03: 75 ug/kg/min via INTRAVENOUS

## 2021-04-03 MED ORDER — CEFAZOLIN SODIUM-DEXTROSE 2-3 GM-%(50ML) IV SOLR
INTRAVENOUS | Status: DC | PRN
Start: 2021-04-03 — End: 2021-04-03
  Administered 2021-04-03: 2 g via INTRAVENOUS

## 2021-04-03 MED ORDER — SODIUM CHLORIDE 0.9 % IV SOLN: INTRAVENOUS | Status: DC

## 2021-04-03 MED ORDER — PHENYLEPHRINE HCL-NACL 20-0.9 MG/250ML-% IV SOLN
INTRAVENOUS | Status: DC | PRN
  Administered 2021-04-03: 20 ug/min via INTRAVENOUS

## 2021-04-03 MED ORDER — SODIUM CHLORIDE 0.9 % IV SOLN: INTRAVENOUS | Status: DC | PRN

## 2021-04-03 MED ORDER — FENTANYL CITRATE (PF) 100 MCG/2ML IJ SOLN
INTRAMUSCULAR | Status: AC
  Administered 2021-04-03: 50 ug via INTRAVENOUS
  Filled 2021-04-03: qty 2

## 2021-04-03 MED ORDER — HEPARIN 6000 UNIT IRRIGATION SOLUTION
Status: AC
  Filled 2021-04-03: qty 500

## 2021-04-03 MED ORDER — 0.9 % SODIUM CHLORIDE (POUR BTL) OPTIME
TOPICAL | Status: DC | PRN
  Administered 2021-04-03: 1000 mL

## 2021-04-03 MED ORDER — ACETAMINOPHEN 500 MG PO TABS
1000.0000 mg | ORAL_TABLET | Freq: Once | ORAL | Status: AC
  Administered 2021-04-03: 1000 mg via ORAL

## 2021-04-03 MED ORDER — CHLORHEXIDINE GLUCONATE 0.12 % MT SOLN
OROMUCOSAL | Status: AC
  Filled 2021-04-03: qty 15

## 2021-04-03 MED ORDER — FENTANYL CITRATE (PF) 100 MCG/2ML IJ SOLN
25.0000 ug | INTRAMUSCULAR | Status: DC | PRN
  Administered 2021-04-03: 25 ug via INTRAVENOUS

## 2021-04-03 MED ORDER — HEPARIN SODIUM (PORCINE) 1000 UNIT/ML IJ SOLN
INTRAMUSCULAR | Status: DC | PRN
  Administered 2021-04-03: 5000 [IU] via INTRAVENOUS

## 2021-04-03 MED ORDER — PHENYLEPHRINE 40 MCG/ML (10ML) SYRINGE FOR IV PUSH (FOR BLOOD PRESSURE SUPPORT)
PREFILLED_SYRINGE | INTRAVENOUS | Status: DC | PRN
Start: 2021-04-03 — End: 2021-04-03
  Administered 2021-04-03 (×3): 80 ug via INTRAVENOUS

## 2021-04-03 MED ORDER — ORAL CARE MOUTH RINSE: 15.0000 mL | Freq: Once | OROMUCOSAL | Status: AC

## 2021-04-03 MED ORDER — LIDOCAINE-EPINEPHRINE (PF) 1 %-1:200000 IJ SOLN
INTRAMUSCULAR | Status: AC
  Filled 2021-04-03: qty 30

## 2021-04-03 MED ORDER — ACETAMINOPHEN 500 MG PO TABS
1000.0000 mg | ORAL_TABLET | Freq: Once | ORAL | Status: AC
  Filled 2021-04-03: qty 2

## 2021-04-03 MED ORDER — MIDAZOLAM HCL 2 MG/2ML IJ SOLN
INTRAMUSCULAR | Status: AC
  Filled 2021-04-03: qty 2

## 2021-04-03 MED ORDER — FENTANYL CITRATE (PF) 250 MCG/5ML IJ SOLN
INTRAMUSCULAR | Status: AC
  Filled 2021-04-03: qty 5

## 2021-04-03 MED ORDER — FENTANYL CITRATE (PF) 100 MCG/2ML IJ SOLN
INTRAMUSCULAR | Status: AC
  Filled 2021-04-03: qty 2

## 2021-04-03 MED ORDER — CHLORHEXIDINE GLUCONATE 0.12 % MT SOLN
15.0000 mL | Freq: Once | OROMUCOSAL | Status: AC
  Administered 2021-04-03: 15 mL via OROMUCOSAL

## 2021-04-03 MED ORDER — ONDANSETRON HCL 4 MG/2ML IJ SOLN: 4.0000 mg | Freq: Once | INTRAMUSCULAR | Status: DC | PRN

## 2021-04-03 MED ORDER — PAPAVERINE HCL 30 MG/ML IJ SOLN
INTRAMUSCULAR | Status: AC
  Filled 2021-04-03: qty 2

## 2021-04-03 MED ORDER — MUPIROCIN 2 % EX OINT
1.0000 "application " | TOPICAL_OINTMENT | Freq: Two times a day (BID) | CUTANEOUS | Status: DC
  Administered 2021-04-03 – 2021-04-04 (×3): 1 via NASAL
  Filled 2021-04-03 (×2): qty 22

## 2021-04-03 MED ORDER — HEPARIN 6000 UNIT IRRIGATION SOLUTION
Status: DC | PRN
  Administered 2021-04-03: 1

## 2021-04-03 SURGICAL SUPPLY — 38 items
ADH SKN CLS APL DERMABOND .7 (GAUZE/BANDAGES/DRESSINGS) ×1
APL PRP STRL LF DISP 70% ISPRP (MISCELLANEOUS) ×1
APL SKNCLS STERI-STRIP NONHPOA (GAUZE/BANDAGES/DRESSINGS) ×1
ARMBAND PINK RESTRICT EXTREMIT (MISCELLANEOUS) ×2 IMPLANT
BENZOIN TINCTURE PRP APPL 2/3 (GAUZE/BANDAGES/DRESSINGS) ×2 IMPLANT
CANISTER SUCT 3000ML PPV (MISCELLANEOUS) ×2 IMPLANT
CANNULA VESSEL 3MM 2 BLNT TIP (CANNULA) ×2 IMPLANT
CATH EMB 4FR 40CM (CATHETERS) ×1 IMPLANT
CHLORAPREP W/TINT 26 (MISCELLANEOUS) ×2 IMPLANT
CLIP LIGATING EXTRA MED SLVR (CLIP) ×2 IMPLANT
CLIP LIGATING EXTRA SM BLUE (MISCELLANEOUS) ×2 IMPLANT
COVER PROBE W GEL 5X96 (DRAPES) ×1 IMPLANT
DERMABOND ADVANCED (GAUZE/BANDAGES/DRESSINGS) ×1
DERMABOND ADVANCED .7 DNX12 (GAUZE/BANDAGES/DRESSINGS) IMPLANT
ELECT REM PT RETURN 9FT ADLT (ELECTROSURGICAL) ×2
ELECTRODE REM PT RTRN 9FT ADLT (ELECTROSURGICAL) ×1 IMPLANT
GLOVE SURG POLYISO LF SZ8 (GLOVE) ×2 IMPLANT
GOWN STRL REUS W/ TWL LRG LVL3 (GOWN DISPOSABLE) ×2 IMPLANT
GOWN STRL REUS W/ TWL XL LVL3 (GOWN DISPOSABLE) ×1 IMPLANT
GOWN STRL REUS W/TWL LRG LVL3 (GOWN DISPOSABLE) ×4
GOWN STRL REUS W/TWL XL LVL3 (GOWN DISPOSABLE) ×2
KIT BASIN OR (CUSTOM PROCEDURE TRAY) ×2 IMPLANT
KIT TURNOVER KIT B (KITS) ×2 IMPLANT
NDL 18GX1X1/2 (RX/OR ONLY) (NEEDLE) IMPLANT
NEEDLE 18GX1X1/2 (RX/OR ONLY) (NEEDLE) IMPLANT
NS IRRIG 1000ML POUR BTL (IV SOLUTION) ×2 IMPLANT
PACK CV ACCESS (CUSTOM PROCEDURE TRAY) ×2 IMPLANT
PAD ARMBOARD 7.5X6 YLW CONV (MISCELLANEOUS) ×4 IMPLANT
SLING ARM FOAM STRAP LRG (SOFTGOODS) ×1 IMPLANT
STRIP CLOSURE SKIN 1/2X4 (GAUZE/BANDAGES/DRESSINGS) ×2 IMPLANT
SUT MNCRL AB 4-0 PS2 18 (SUTURE) ×2 IMPLANT
SUT PROLENE 6 0 BV (SUTURE) ×3 IMPLANT
SUT VIC AB 3-0 SH 27 (SUTURE) ×2
SUT VIC AB 3-0 SH 27X BRD (SUTURE) ×1 IMPLANT
SYR 3ML LL SCALE MARK (SYRINGE) ×1 IMPLANT
TOWEL GREEN STERILE (TOWEL DISPOSABLE) ×2 IMPLANT
UNDERPAD 30X36 HEAVY ABSORB (UNDERPADS AND DIAPERS) ×2 IMPLANT
WATER STERILE IRR 1000ML POUR (IV SOLUTION) ×2 IMPLANT

## 2021-04-03 NOTE — Progress Notes (Signed)
IVT consulted for PIV placement prior to surgery.  Pt location shows in surgery now.  Called primary RN and advised if a PIV is needed when the pt returns from surgery to put in a new consult.

## 2021-04-03 NOTE — Anesthesia Procedure Notes (Signed)
Anesthesia Regional Block: Supraclavicular block   Pre-Anesthetic Checklist: , timeout performed,  Correct Patient, Correct Site, Correct Laterality,  Correct Procedure, Correct Position, site marked,  Risks and benefits discussed,  Surgical consent,  Pre-op evaluation,  At surgeon's request and post-op pain management  Laterality: Right  Prep: Maximum Sterile Barrier Precautions used, chloraprep       Needles:  Injection technique: Single-shot  Needle Type: Echogenic Stimulator Needle     Needle Length: 9cm  Needle Gauge: 22     Additional Needles:   Procedures:,,,, ultrasound used (permanent image in chart),,    Narrative:  Start time: 04/03/2021 8:15 AM End time: 04/03/2021 8:20 AM Injection made incrementally with aspirations every 5 mL.  Performed by: Personally  Anesthesiologist: Pervis Hocking, DO  Additional Notes: Monitors applied. No increased pain on injection. No increased resistance to injection. Injection made in 5cc increments. Good needle visualization. Patient tolerated procedure well.

## 2021-04-03 NOTE — Progress Notes (Signed)
[facility] OF [place] PROGRESS NOTE  ASSESSMENT / PLAN: Wayne Meadows is a 87 y.o. male with new diagnosis ESRD. Plan right brachiocephalic arteriovenous fistula today in OR.   SUBJECTIVE: Reviewed operative plan. All questions answered.  OBJECTIVE: BP (!) 179/65    Pulse 71    Temp 98.3 F (36.8 C) (Oral)    Resp 18    Ht 6\' 2"  (1.88 m)    Wt 87.9 kg    SpO2 95%    BMI 24.88 kg/m   Intake/Output Summary (Last 24 hours) at 04/03/2021 0828 Last data filed at 04/03/2021 0640 Gross per 24 hour  Intake 1260 ml  Output 1600 ml  Net -340 ml    No distress RRR Unlabored breathing 2+ radial pulses  CBC Latest Ref Rng & Units 04/01/2021 03/31/2021 03/26/2021  WBC 4.0 - 10.5 K/uL 8.1 6.1 6.3  Hemoglobin 13.0 - 17.0 g/dL 9.6(L) 9.5(L) 9.2(L)  Hematocrit 39.0 - 52.0 % 30.8(L) 31.0(L) 29.3(L)  Platelets 150 - 400 K/uL 275 252 232     CMP Latest Ref Rng & Units 04/03/2021 04/02/2021 04/01/2021  Glucose 70 - 99 mg/dL 77 71 73  BUN 8 - 23 mg/dL 40(H) 33(H) 56(H)  Creatinine 0.61 - 1.24 mg/dL 5.29(H) 4.33(H) 5.73(H)  Sodium 135 - 145 mmol/L 139 136 141  Potassium 3.5 - 5.1 mmol/L 4.0 5.4(H) 4.4  Chloride 98 - 111 mmol/L 103 103 104  CO2 22 - 32 mmol/L 21(L) 17(L) 23  Calcium 8.9 - 10.3 mg/dL 8.0(L) 7.9(L) 7.9(L)  Total Protein 6.5 - 8.1 g/dL - - -  Total Bilirubin 0.3 - 1.2 mg/dL - - -  Alkaline Phos 38 - 126 U/L - - -  AST 15 - 41 U/L - - -  ALT 0 - 44 U/L - - -    Estimated Creatinine Clearance: 12.1 mL/min (A) (by C-G formula based on SCr of 5.29 mg/dL (H)).  Wayne Meadows. Stanford Breed, MD [facility] Phone Number: [phone] 04/03/2021 8:28 AM

## 2021-04-03 NOTE — Progress Notes (Signed)
0705 Received call from [place] that transport will be coming to pick patient up for surgery.OR staff made aware that there is no consent as there was no order for OR. Also no peripheral IV since AVF will be placed on Rt FA and IV was taken out and IV team RN probably won't be able to make it before transport comes.0715 Call made to Short stay RN and gave report.

## 2021-04-03 NOTE — Progress Notes (Signed)
PROGRESS NOTE    Wayne Meadows  YYT:[id number] DOB: December 19, 1934 DOA: 03/17/2021 PCP: [name]   Brief Narrative:  Wayne Meadows is a 87 y.o. male with history of chronic kidney disease stage IIIb, CVA and CAD, diabetes mellitus, COPD, hypertension was recently admitted for cellulitis of the lower extremity, discharged on antibiotic presented to hospital with increasing weakness, difficulty ambulating with increasing redness and swelling of his lower extremities.  In the ED, patient was noted to have weeping of his bilateral lower extremities with erythema.  Creatinine had worsened from 2.9 to 4.8 on presentation.   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  Nephrology was consulted during hospitalization for management of fluid overload and renal failure.  2/6 status post right IJ tunneled HD catheter placed by IR today 2/7 tolerated HD 2/8 complaining of nausea 2/9 RUE Brachiocephalic AV fistula placement  Assessment & Plan:   Acute on chronic kidney disease stage IV with non-anion gap metabolic acidosis: Nephrology and vascular following, appreciate insight recommendations Right IJ catheter 2/6 - RUE brachiocephalic fistula 2/9  Plavix on hold perioperatively   Gross bilateral lower extremity edema anasarca/venous insufficiency.   Secondary to above Improving with fluid management with dialysis  CAD, chronic History of coronary stent placement. Continue aspirin; Holding Plavix for primary dialysis access placement by vascular   Essential hypertension  Continue clonidine, amlodipine, metoprolol Once HD started need to monitor volume unloading and BP -May need to wean down home blood pressure medications pending postdialysis pressures   Hypothyroidism, chronic Continue Synthroid   COPD, chronic  Compensated at this time.  Continue bronchodilators.    Chronic anemia, of chronic disease, stable Appears to be at baseline around 9-10.   Debility, deconditioning Ambulatory dysfunction, chronic Uses a walker and cane at home  -PT recommending SNF    DVT prophylaxis: Heparin Code Status: Full Family Communication: None present  Status is: Inpatient  Dispo: The patient is from: Home              Anticipated d/c is to: SNF              Anticipated d/c date is: 24-48 hours              Patient currently not medically stable for discharge  Consultants:  Nephrology, vascular surgery  Antimicrobials:  None  Subjective: No acute issues or events overnight, tolerated procedure well  Objective: Vitals:   04/02/21 2022 04/03/21 0442 04/03/21 0500 04/03/21 0813  BP: (!) 155/60 (!) 151/60  (!) 179/65  Pulse: 72 71  71  Resp: 18 18  18   Temp: 98.3 F (36.8 C) 98.3 F (36.8 C)  98.3 F (36.8 C)  TempSrc: Oral Oral  Oral  SpO2: 96% 95%    Weight:   87.9 kg   Height:        Intake/Output Summary (Last 24 hours) at 04/03/2021 0814 Last data filed at 04/03/2021 0640 Gross per 24 hour  Intake 1260 ml  Output 1600 ml  Net -340 ml    Filed Weights   04/01/21 1124 04/02/21 0505 04/03/21 0500  Weight: 85 kg 87.9 kg 87.9 kg    Examination:  General exam: Appears calm and comfortable  Respiratory system: Clear to auscultation. Respiratory effort normal. Cardiovascular system: S1 & S2 heard, RRR. No JVD, murmurs, rubs, gallops or clicks. No pedal edema. Gastrointestinal system: Abdomen is nondistended,  soft and nontender. No organomegaly or masses felt. Normal bowel sounds heard. Central nervous system: Alert and oriented. No focal neurological deficits. Extremities: Symmetric 5 x 5 power. Skin: No rashes, lesions or ulcers Psychiatry: Judgement and insight appear normal. Mood & affect appropriate.   Data Reviewed: I have personally reviewed following labs and imaging studies  CBC: Recent Labs  Lab 03/31/21 0459  04/01/21 0857  WBC 6.1 8.1  HGB 9.5* 9.6*  HCT 31.0* 30.8*  MCV 92.5 93.1  PLT 252 607    Basic Metabolic Panel: Recent Labs  Lab 03/30/21 0136 03/31/21 0504 04/01/21 0314 04/02/21 0305 04/03/21 0104  NA 138 137 141 136 139  K 4.6 4.4 4.4 5.4* 4.0  CL 106 104 104 103 103  CO2 20* 19* 23 17* 21*  GLUCOSE 97 80 73 71 77  BUN 80* 82* 56* 33* 40*  CREATININE 7.11* 7.32* 5.73* 4.33* 5.29*  CALCIUM 8.0* 7.8* 7.9* 7.9* 8.0*  PHOS 6.9* 7.3* 5.7* 4.7* 5.8*    GFR: Estimated Creatinine Clearance: 12.1 mL/min (A) (by C-G formula based on SCr of 5.29 mg/dL (H)). Liver Function Tests: Recent Labs  Lab 03/30/21 0136 03/31/21 0504 04/01/21 0314 04/02/21 0305 04/03/21 0104  ALBUMIN 2.1* 2.0* 2.2* 2.1* 2.1*    No results for input(s): LIPASE, AMYLASE in the last 168 hours. No results for input(s): AMMONIA in the last 168 hours. Coagulation Profile: No results for input(s): INR, PROTIME in the last 168 hours. Cardiac Enzymes: No results for input(s): CKTOTAL, CKMB, CKMBINDEX, TROPONINI in the last 168 hours. BNP (last 3 results) No results for input(s): PROBNP in the last 8760 hours. HbA1C: No results for input(s): HGBA1C in the last 72 hours. CBG: No results for input(s): GLUCAP in the last 168 hours. Lipid Profile: No results for input(s): CHOL, HDL, LDLCALC, TRIG, CHOLHDL, LDLDIRECT in the last 72 hours. Thyroid Function Tests: No results for input(s): TSH, T4TOTAL, FREET4, T3FREE, THYROIDAB in the last 72 hours. Anemia Panel: No results for input(s): VITAMINB12, FOLATE, FERRITIN, TIBC, IRON, RETICCTPCT in the last 72 hours. Sepsis Labs: No results for input(s): PROCALCITON, LATICACIDVEN in the last 168 hours. No results found for this or any previous visit (from the past 240 hour(s)).   Radiology Studies: VAS [place] UPPER EXT VEIN MAPPING (PRE-OP AVF)  Result Date: 04/02/2021 UPPER EXTREMITY VEIN MAPPING Patient Name:  Wayne Meadows  Date of Exam:   04/02/2021 Medical  Rec #: [id number]         Accession #:    [id number] Date of Birth: 09/29/1934        Patient Gender: M Patient Age:   57 years Exam Location:  [facility] Procedure:      VAS [place] UPPER EXT VEIN MAPPING (PRE-OP AVF) Referring Phys: Dagoberto Ligas --------------------------------------------------------------------------------  Indications: Pre-access. Comparison Study: No previous exams Performing Technologist: Jody Hill RVT, RDMS  Examination Guidelines: A complete evaluation includes B-mode imaging, spectral Doppler, color Doppler, and power Doppler as needed of all accessible portions of each vessel. Bilateral testing is considered an integral part of a complete examination. Limited examinations for reoccurring indications may be performed as noted. +-----------------+-------------+----------+--------------------------+  Right Cephalic    Diameter (cm) Depth (cm)          Findings           +-----------------+-------------+----------+--------------------------+  Shoulder              0.36         0.71                                +-----------------+-------------+----------+--------------------------+  Prox upper arm        0.38         0.63                                +-----------------+-------------+----------+--------------------------+  Mid upper arm         0.32         0.36                                +-----------------+-------------+----------+--------------------------+  Dist upper arm        0.31         0.34                                +-----------------+-------------+----------+--------------------------+  Antecubital fossa     0.45         0.30                                +-----------------+-------------+----------+--------------------------+  Prox forearm                               not visualized and IV/TAPE  +-----------------+-------------+----------+--------------------------+  Mid forearm           0.36         0.43                                 +-----------------+-------------+----------+--------------------------+  Dist forearm          0.31         0.40                                +-----------------+-------------+----------+--------------------------+  Wrist                 0.25         0.51            branching           +-----------------+-------------+----------+--------------------------+ +-----------------+-------------+----------+---------+  Right Basilic     Diameter (cm) Depth (cm) Findings   +-----------------+-------------+----------+---------+  Prox upper arm        0.70                            +-----------------+-------------+----------+---------+  Mid upper arm         0.61                            +-----------------+-------------+----------+---------+  Dist upper arm        0.52                            +-----------------+-------------+----------+---------+  Antecubital fossa     0.58                            +-----------------+-------------+----------+---------+  Prox forearm          0.37  branching  +-----------------+-------------+----------+---------+  Mid forearm           0.32                            +-----------------+-------------+----------+---------+  Distal forearm        0.27                            +-----------------+-------------+----------+---------+  Wrist                 0.20                            +-----------------+-------------+----------+---------+ +-----------------+-------------+----------+---------+  Left Cephalic     Diameter (cm) Depth (cm) Findings   +-----------------+-------------+----------+---------+  Shoulder              0.37         0.45               +-----------------+-------------+----------+---------+  Prox upper arm        0.27         0.28               +-----------------+-------------+----------+---------+  Mid upper arm         0.28         0.28               +-----------------+-------------+----------+---------+  Dist upper arm        0.32         0.48                +-----------------+-------------+----------+---------+  Antecubital fossa     0.39         0.53    branching  +-----------------+-------------+----------+---------+  Prox forearm          0.26         0.52               +-----------------+-------------+----------+---------+  Mid forearm           0.32         0.47               +-----------------+-------------+----------+---------+  Dist forearm          0.27         0.55               +-----------------+-------------+----------+---------+  Wrist                 0.33         0.45    branching  +-----------------+-------------+----------+---------+ +-----------------+-------------+----------+---------+  Left Basilic      Diameter (cm) Depth (cm) Findings   +-----------------+-------------+----------+---------+  Prox upper arm        0.56                            +-----------------+-------------+----------+---------+  Mid upper arm         0.55                            +-----------------+-------------+----------+---------+  Dist upper arm        0.58                            +-----------------+-------------+----------+---------+  Antecubital fossa     0.48                 branching  +-----------------+-------------+----------+---------+  Prox forearm          0.27                            +-----------------+-------------+----------+---------+  Mid forearm           0.27                            +-----------------+-------------+----------+---------+  Distal forearm        0.27                            +-----------------+-------------+----------+---------+  Wrist                 0.32                            +-----------------+-------------+----------+---------+ *See table(s) above for measurements and observations.  Diagnosing physician: Harold Barban MD Electronically signed by Harold Barban MD on 04/02/2021 at 8:54:40 PM.    Final         Scheduled Meds:  acetaminophen  1,000 mg Oral Once   acetaminophen  1,000 mg Oral Once   [MAR Hold]  amLODipine  10 mg Oral Daily   [MAR Hold] aspirin EC  81 mg Oral Daily   chlorhexidine  15 mL Mouth/Throat Once   Or   mouth rinse  15 mL Mouth Rinse Once   [MAR Hold] Chlorhexidine Gluconate Cloth  6 each Topical Q0600   [MAR Hold] cloNIDine  0.1 mg Oral Daily   [MAR Hold] docusate sodium  100 mg Oral BID   [MAR Hold] ferrous sulfate  325 mg Oral BID WC   [MAR Hold] heparin  5,000 Units Subcutaneous Q8H   [MAR Hold] hydrocerin   Topical Once per day on Mon Thu   [facility] Hold] lanthanum  1,000 mg Oral TID WC   [MAR Hold] levothyroxine  50 mcg Oral Q0600   [MAR Hold] lidocaine  1 patch Transdermal Q24H   [MAR Hold] melatonin  3 mg Oral QHS   [MAR Hold] metoprolol tartrate  25 mg Oral Daily   [MAR Hold] polyethylene glycol  17 g Oral Daily   [MAR Hold] pravastatin  40 mg Oral QHS   Continuous Infusions:  sodium chloride       LOS: 16 days   Time spent: 64min  Alberto C Destiny Hagin, DO [facility]  If 7PM-7AM, please contact night-coverage [email or web address]  04/03/2021, 8:14 AM

## 2021-04-03 NOTE — Transfer of Care (Signed)
Immediate Anesthesia Transfer of Care Note  Patient: Wayne Meadows  Procedure(s) Performed: ARTERIOVENOUS (AV) CREATION OF RIGHT ARM BRACHIOCEPHALIC FISTULA (Right: Arm Upper) THROMBECTOMY ARTERIOVENOUS FISTULA (Right: Arm Upper)  Patient Location: PACU  Anesthesia Type:MAC combined with regional for post-op pain  Level of Consciousness: drowsy and patient cooperative  Airway & Oxygen Therapy: Patient Spontanous Breathing and Patient connected to face mask oxygen  Post-op Assessment: Report given to RN and Post -op Vital signs reviewed and stable  Post vital signs: Reviewed and stable  Last Vitals:  Vitals Value Taken Time  BP 142/60 04/03/21 0956  Temp    Pulse 71 04/03/21 0957  Resp 17 04/03/21 0957  SpO2 96 % 04/03/21 0957  Vitals shown include unvalidated device data.  Last Pain:  Vitals:   04/03/21 0813  TempSrc: Oral  PainSc: 0-No pain      Patients Stated Pain Goal: 0 (58/09/98 3382)  Complications: No notable events documented.

## 2021-04-03 NOTE — Anesthesia Postprocedure Evaluation (Signed)
Anesthesia Post Note  Patient: Wayne Meadows  Procedure(s) Performed: ARTERIOVENOUS (AV) CREATION OF RIGHT ARM BRACHIOCEPHALIC FISTULA (Right: Arm Upper) THROMBECTOMY ARTERIOVENOUS FISTULA (Right: Arm Upper)     Patient location during evaluation: PACU Anesthesia Type: Regional and MAC Level of consciousness: awake and alert Pain management: pain level controlled Vital Signs Assessment: post-procedure vital signs reviewed and stable Respiratory status: spontaneous breathing, nonlabored ventilation and respiratory function stable Cardiovascular status: blood pressure returned to baseline and stable Postop Assessment: no apparent nausea or vomiting Anesthetic complications: no   No notable events documented.  Last Vitals:  Vitals:   04/03/21 1025 04/03/21 1045  BP: (!) 145/66 (!) 143/60  Pulse:  64  Resp: 12 16  Temp:  36.6 C  SpO2:  94%    Last Pain:  Vitals:   04/03/21 1340  TempSrc:   PainSc: [place]

## 2021-04-03 NOTE — Op Note (Signed)
DATE OF SERVICE: 04/03/2021  PATIENT:  Wayne Meadows  87 y.o. male  PRE-OPERATIVE DIAGNOSIS:  ESRD  POST-OPERATIVE DIAGNOSIS:  Same  PROCEDURE:   Right cephalic vein thrombectomy Right brachiocephalic arteriovenous fistula creation  SURGEON:  Surgeon(s) and Role:    * Cherre Robins, MD - Primary  ASSISTANT: Gerri Lins, PA-C  An assistant was required to facilitate exposure and expedite the case.  ANESTHESIA:   regional and MAC  EBL: minimal  BLOOD ADMINISTERED:none  DRAINS: none   LOCAL MEDICATIONS USED:  NONE  SPECIMEN:  none  COUNTS: confirmed correct.  TOURNIQUET:  none  PATIENT DISPOSITION:  PACU - hemodynamically stable.   Delay start of Pharmacological VTE agent (>24hrs) due to surgical blood loss or risk of bleeding: no  INDICATION FOR PROCEDURE: Wayne Meadows is a 87 y.o. male with new diagnosis of end-stage renal disease.  A tunneled dialysis catheter has been placed.  Preoperative vein mapping suggested suitable right cephalic vein for AV fistula creation.  The vein has been previously cannulated with an intravenous catheter. After careful discussion of risks, benefits, and alternatives the patient was offered fistula creation. The patient understood and wished to proceed.  OPERATIVE FINDINGS: Chronic appearing thrombus in the right cephalic vein easily thrombectomized with a #4 Fogarty embolectomy balloon catheter.  Otherwise unremarkable brachiocephalic arteriovenous fistula creation.  Palpable radial pulse at completion.  Good Doppler thrill distal to the anastomosis.  DESCRIPTION OF PROCEDURE: After identification of the patient in the pre-operative holding area, the patient was transferred to the operating room. The patient was positioned supine on the operating room table. Anesthesia was induced. The right arm was prepped and draped in standard fashion. A surgical pause was performed confirming correct patient, procedure, and operative  location.  Using intraoperative ultrasound, the course of the right upper extremity superficial veins was mapped.  The cephalic vein appeared adequate for arteriovenous fistula creation.  The brachial artery was similarly mapped.  The artery appeared adequate for arterial venous creation.   A transverse incision was made in the right arm just distal to the antecubital crease.  Incision was carried down through subcutaneous tissue until the cephalic vein was identified and skeletonized.  We continued our exposure through the aponeurosis of the biceps.  The brachial artery was encountered its usual position.  The artery was circumferentially exposed and encircled with Silastic Vesseloops.  Patient was systemically heparinized.  The distal cephalic vein was transected.  A #4 Fogarty embolectomy balloon catheter was passed centrally into the cephalic vein.  Several passes of the Fogarty were performed.  Chronic thrombus returned twice.  A third pass was negative.  The distal stump of the cephalic vein was oversewn with a 2-0 silk suture.  The proximal vein was controlled with a bulldog clamp.  The brachial artery was clamped proximally medially.  An anterior arteriotomy was made with a 11 blade.  The arteriotomy was extended with Potts scissors.  Using a parachute technique the cephalic vein was anastomosed to the brachial arteriotomy in end-to-side fashion with continuous running suture of 6-0 Prolene.  Immediately prior to completion the anastomosis was flushed and de-aired.  The anastomosis was completed.  Hemostasis was assured.  The fistula was interrogated with Doppler.  Pulsatile flow was noted in the first 2 cm of the fistula.  After this, audible bruit was heard throughout the course of the cephalic vein.  A radial artery pulse was felt at the wrist.   Upon completion of the case instrument  and sharps counts were confirmed correct. The patient was transferred to the PACU in good condition. I was  present for all portions of the procedure.  [name]. Stanford Breed, MD [facility] Phone Number: [phone] 04/03/2021 9:47 AM

## 2021-04-03 NOTE — Progress Notes (Signed)
Patient ID: Wayne Meadows, male   DOB: 02-26-1934, 87 y.o.   MRN: [id number] [facility] Progress Note   Assessment/ Plan:   ESRD: new to dialysis. Baseline creatinine around 2.0- 2.8, pt was admitted w/ creat 4.8 and progressed to 7.3, likely due to cardiorenal mechanism with poor response to diuresis. Decision was made to start patient on HD.  IR placed TDC on 2/06 and pt had 1st HD 2/06 and 2nd HD 2/07. VVS has seen and have created R BC AVF today. Pt is not expected to recover renal function, suspect this is ESRD. CLIP process is underway. HD today.  Nausea/ dry heaves: started on HD 2/07. Suspect dehydrated, gave some IVF's overnight. No nausea today. Keep even w/ HD today.  Hypertension: Blood pressures okay on clonidine, amlodipine and metoprolol. Vol overload has resolved, see above also.  Anemia ckd: Without overt blood loss and likely secondary to chronic illness.  Iron stores replete. Last HD 9 on 1/27, observe. Consider esa.  Hyperphosphatemia: d/t CKD/ ESRD, improving w/ fosrenol as binder MBD CKD: Ca in range, phos down to 4.7 on binder as above  [name], MD 04/03/2021, 3:48 PM     Subjective:   No c/o's , seen in room, R arm hurting after AVF placement   Objective:   BP (!) 154/72    Pulse 67    Temp 98.5 F (36.9 C) (Oral)    Resp 12    Ht 6\' 2"  (1.88 m)    Wt 87 kg    SpO2 94%    BMI 24.63 kg/m   Intake/Output Summary (Last 24 hours) at 04/03/2021 1548 Last data filed at 04/03/2021 1025 Gross per 24 hour  Intake 1300 ml  Output 770 ml  Net 530 ml    Weight change: 2.9 kg  Physical Exam: Gen: Comfortably seen in HD CVS: Pulse regular rhythm, normal rate, S1 and S2 normal Resp: Anteriorly clear to auscultation bilaterally, no distinct rales or rhonchi Abd: Soft, obese, nontender Ext: trace edema pretib, lower legs are wrapped, no hip or UE edema  RIJ TDC   Labs: BMET Recent Labs  Lab 03/28/21 0405 03/29/21 1002 03/30/21 0136  03/31/21 0504 04/01/21 0314 04/02/21 0305 04/03/21 0104  NA 138 135 138 137 141 136 139  K 4.5 4.7 4.6 4.4 4.4 5.4* 4.0  CL 109 106 106 104 104 103 103  CO2 19* 17* 20* 19* 23 17* 21*  GLUCOSE 88 114* 97 80 73 71 77  BUN 79* 83* 80* 82* 56* 33* 40*  CREATININE 6.42* 6.95* 7.11* 7.32* 5.73* 4.33* 5.29*  CALCIUM 8.2* 8.0* 8.0* 7.8* 7.9* 7.9* 8.0*  PHOS 8.0*  --  6.9* 7.3* 5.7* 4.7* 5.8*    CBC Recent Labs  Lab 03/31/21 0459 04/01/21 0857  WBC 6.1 8.1  HGB 9.5* 9.6*  HCT 31.0* 30.8*  MCV 92.5 93.1  PLT 252 275     Medications:     amLODipine  10 mg Oral Daily   aspirin EC  81 mg Oral Daily   chlorhexidine       Chlorhexidine Gluconate Cloth  6 each Topical Q0600   cloNIDine  0.1 mg Oral Daily   docusate sodium  100 mg Oral BID   fentaNYL       ferrous sulfate  325 mg Oral BID WC   heparin  5,000 Units Subcutaneous Q8H   hydrocerin   Topical Once per day on Mon Thu   lanthanum  1,000 mg  Oral TID WC   levothyroxine  50 mcg Oral Q0600   lidocaine  1 patch Transdermal Q24H   melatonin  3 mg Oral QHS   metoprolol tartrate  25 mg Oral Daily   mupirocin ointment  1 application Nasal BID   polyethylene glycol  17 g Oral Daily   pravastatin  40 mg Oral QHS

## 2021-04-03 NOTE — Anesthesia Preprocedure Evaluation (Addendum)
Anesthesia Evaluation  Patient identified by MRN, date of birth, ID band Patient awake    Reviewed: Allergy & Precautions, NPO status , Patient's Chart, lab work & pertinent test results, reviewed documented beta blocker date and time   Airway Mallampati: III  TM Distance: >3 FB Neck ROM: Full    Dental  (+) Edentulous Upper, Dental Advisory Given, Poor Dentition   Pulmonary COPD,  COPD inhaler, former smoker,  Quit smoking 2009   Pulmonary exam normal breath sounds clear to auscultation       Cardiovascular hypertension, Pt. on medications and Pt. on home beta blockers + CAD, + Past MI, + Cardiac Stents and +CHF (grade 1 diastolic dysfunction)  Normal cardiovascular exam+ Valvular Problems/Murmurs (mild to mod AI) AI  Rhythm:Regular Rate:Normal  Echo 02/2021: 1. Left ventricular ejection fraction, by estimation, is 65 to 70%. The  left ventricle has hyperdynamic function. The left ventricle has no  regional wall motion abnormalities. There is mild concentric left  ventricular hypertrophy. Left ventricular  diastolic parameters are consistent with Grade I diastolic dysfunction  (impaired relaxation).  2. Right ventricular systolic function is normal. The right ventricular  size is normal. There is mildly elevated pulmonary artery systolic  pressure.  3. Left atrial size was mildly dilated.  4. 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  can't rule out PFO.  5. Right atrial size was mildly dilated.  6. The mitral valve is normal in structure. No evidence of mitral valve  regurgitation.  7. The aortic valve was not well visualized. Aortic valve regurgitation  is mild to moderate.  8. The inferior vena cava is normal in size with greater than 50%  respiratory variability, suggesting right atrial pressure of 3 mmHg.    Neuro/Psych PSYCHIATRIC DISORDERS Anxiety CVA (2011)    GI/Hepatic negative  GI ROS, Neg liver ROS,   Endo/Other  Hypothyroidism   Renal/GU ESRF and DialysisRenal diseaseHx RCC  negative genitourinary   Musculoskeletal  (+) Arthritis , Osteoarthritis,    Abdominal   Peds negative pediatric ROS (+)  Hematology  (+) Blood dyscrasia, anemia , Hb 9.6   Anesthesia Other Findings plavix   Reproductive/Obstetrics negative OB ROS                            Anesthesia Physical Anesthesia Plan  ASA: 3  Anesthesia Plan: MAC and Regional   Post-op Pain Management: Regional block and Tylenol PO (pre-op)   Induction:   PONV Risk Score and Plan: 2 and Propofol infusion and TIVA  Airway Management Planned: Natural Airway and Simple Face Mask  Additional Equipment: None  Intra-op Plan:   Post-operative Plan:   Informed Consent: I have reviewed the patients History and Physical, chart, labs and discussed the procedure including the risks, benefits and alternatives for the proposed anesthesia with the patient or authorized representative who has indicated his/her understanding and acceptance.       Plan Discussed with: CRNA  Anesthesia Plan Comments:         Anesthesia Quick Evaluation

## 2021-04-04 ENCOUNTER — Encounter (HOSPITAL_COMMUNITY): Payer: Self-pay | Admitting: Vascular Surgery

## 2021-04-04 DIAGNOSIS — K254 Chronic or unspecified gastric ulcer with hemorrhage: Secondary | ICD-10-CM | POA: Diagnosis present

## 2021-04-04 DIAGNOSIS — K769 Liver disease, unspecified: Secondary | ICD-10-CM | POA: Diagnosis present

## 2021-04-04 DIAGNOSIS — D62 Acute posthemorrhagic anemia: Secondary | ICD-10-CM | POA: Diagnosis present

## 2021-04-04 DIAGNOSIS — Z7401 Bed confinement status: Secondary | ICD-10-CM | POA: Diagnosis not present

## 2021-04-04 DIAGNOSIS — N2581 Secondary hyperparathyroidism of renal origin: Secondary | ICD-10-CM | POA: Diagnosis present

## 2021-04-04 DIAGNOSIS — K319 Disease of stomach and duodenum, unspecified: Secondary | ICD-10-CM | POA: Diagnosis present

## 2021-04-04 DIAGNOSIS — K861 Other chronic pancreatitis: Secondary | ICD-10-CM | POA: Diagnosis present

## 2021-04-04 DIAGNOSIS — J432 Centrilobular emphysema: Secondary | ICD-10-CM | POA: Diagnosis not present

## 2021-04-04 DIAGNOSIS — Z20822 Contact with and (suspected) exposure to covid-19: Secondary | ICD-10-CM | POA: Diagnosis present

## 2021-04-04 DIAGNOSIS — L89321 Pressure ulcer of left buttock, stage 1: Secondary | ICD-10-CM | POA: Diagnosis present

## 2021-04-04 DIAGNOSIS — K922 Gastrointestinal hemorrhage, unspecified: Secondary | ICD-10-CM | POA: Diagnosis not present

## 2021-04-04 DIAGNOSIS — K264 Chronic or unspecified duodenal ulcer with hemorrhage: Secondary | ICD-10-CM | POA: Diagnosis present

## 2021-04-04 DIAGNOSIS — I10 Essential (primary) hypertension: Secondary | ICD-10-CM | POA: Diagnosis not present

## 2021-04-04 DIAGNOSIS — I1311 Hypertensive heart and chronic kidney disease without heart failure, with stage 5 chronic kidney disease, or end stage renal disease: Secondary | ICD-10-CM | POA: Diagnosis present

## 2021-04-04 DIAGNOSIS — I2510 Atherosclerotic heart disease of native coronary artery without angina pectoris: Secondary | ICD-10-CM | POA: Diagnosis not present

## 2021-04-04 DIAGNOSIS — T8130XA Disruption of wound, unspecified, initial encounter: Secondary | ICD-10-CM | POA: Diagnosis not present

## 2021-04-04 DIAGNOSIS — D638 Anemia in other chronic diseases classified elsewhere: Secondary | ICD-10-CM | POA: Diagnosis not present

## 2021-04-04 DIAGNOSIS — K259 Gastric ulcer, unspecified as acute or chronic, without hemorrhage or perforation: Secondary | ICD-10-CM | POA: Diagnosis not present

## 2021-04-04 DIAGNOSIS — R110 Nausea: Secondary | ICD-10-CM | POA: Diagnosis not present

## 2021-04-04 DIAGNOSIS — E1122 Type 2 diabetes mellitus with diabetic chronic kidney disease: Secondary | ICD-10-CM | POA: Diagnosis not present

## 2021-04-04 DIAGNOSIS — L03116 Cellulitis of left lower limb: Secondary | ICD-10-CM | POA: Diagnosis not present

## 2021-04-04 DIAGNOSIS — N186 End stage renal disease: Secondary | ICD-10-CM | POA: Diagnosis not present

## 2021-04-04 DIAGNOSIS — J9811 Atelectasis: Secondary | ICD-10-CM | POA: Diagnosis present

## 2021-04-04 DIAGNOSIS — R1111 Vomiting without nausea: Secondary | ICD-10-CM | POA: Diagnosis not present

## 2021-04-04 DIAGNOSIS — M6281 Muscle weakness (generalized): Secondary | ICD-10-CM | POA: Diagnosis not present

## 2021-04-04 DIAGNOSIS — E782 Mixed hyperlipidemia: Secondary | ICD-10-CM | POA: Diagnosis not present

## 2021-04-04 DIAGNOSIS — R791 Abnormal coagulation profile: Secondary | ICD-10-CM | POA: Diagnosis not present

## 2021-04-04 DIAGNOSIS — I6529 Occlusion and stenosis of unspecified carotid artery: Secondary | ICD-10-CM | POA: Diagnosis present

## 2021-04-04 DIAGNOSIS — K269 Duodenal ulcer, unspecified as acute or chronic, without hemorrhage or perforation: Secondary | ICD-10-CM | POA: Diagnosis not present

## 2021-04-04 DIAGNOSIS — I4891 Unspecified atrial fibrillation: Secondary | ICD-10-CM | POA: Diagnosis not present

## 2021-04-04 DIAGNOSIS — J189 Pneumonia, unspecified organism: Secondary | ICD-10-CM | POA: Diagnosis present

## 2021-04-04 DIAGNOSIS — E039 Hypothyroidism, unspecified: Secondary | ICD-10-CM | POA: Diagnosis not present

## 2021-04-04 DIAGNOSIS — D631 Anemia in chronic kidney disease: Secondary | ICD-10-CM | POA: Diagnosis not present

## 2021-04-04 DIAGNOSIS — Z992 Dependence on renal dialysis: Secondary | ICD-10-CM | POA: Diagnosis not present

## 2021-04-04 DIAGNOSIS — I959 Hypotension, unspecified: Secondary | ICD-10-CM | POA: Diagnosis not present

## 2021-04-04 DIAGNOSIS — R197 Diarrhea, unspecified: Secondary | ICD-10-CM | POA: Diagnosis not present

## 2021-04-04 DIAGNOSIS — N179 Acute kidney failure, unspecified: Secondary | ICD-10-CM | POA: Diagnosis present

## 2021-04-04 DIAGNOSIS — J439 Emphysema, unspecified: Secondary | ICD-10-CM | POA: Diagnosis present

## 2021-04-04 DIAGNOSIS — E872 Acidosis, unspecified: Secondary | ICD-10-CM | POA: Diagnosis present

## 2021-04-04 DIAGNOSIS — I480 Paroxysmal atrial fibrillation: Secondary | ICD-10-CM | POA: Diagnosis not present

## 2021-04-04 DIAGNOSIS — D689 Coagulation defect, unspecified: Secondary | ICD-10-CM | POA: Diagnosis present

## 2021-04-04 DIAGNOSIS — K2980 Duodenitis without bleeding: Secondary | ICD-10-CM | POA: Diagnosis present

## 2021-04-04 DIAGNOSIS — R600 Localized edema: Secondary | ICD-10-CM | POA: Diagnosis not present

## 2021-04-04 DIAGNOSIS — L03115 Cellulitis of right lower limb: Secondary | ICD-10-CM | POA: Diagnosis not present

## 2021-04-04 DIAGNOSIS — I679 Cerebrovascular disease, unspecified: Secondary | ICD-10-CM | POA: Diagnosis not present

## 2021-04-04 DIAGNOSIS — R531 Weakness: Secondary | ICD-10-CM | POA: Diagnosis not present

## 2021-04-04 DIAGNOSIS — I120 Hypertensive chronic kidney disease with stage 5 chronic kidney disease or end stage renal disease: Secondary | ICD-10-CM | POA: Diagnosis not present

## 2021-04-04 DIAGNOSIS — Z48812 Encounter for surgical aftercare following surgery on the circulatory system: Secondary | ICD-10-CM

## 2021-04-04 DIAGNOSIS — I639 Cerebral infarction, unspecified: Secondary | ICD-10-CM | POA: Diagnosis not present

## 2021-04-04 DIAGNOSIS — J90 Pleural effusion, not elsewhere classified: Secondary | ICD-10-CM | POA: Diagnosis present

## 2021-04-04 DIAGNOSIS — I739 Peripheral vascular disease, unspecified: Secondary | ICD-10-CM | POA: Diagnosis not present

## 2021-04-04 DIAGNOSIS — J449 Chronic obstructive pulmonary disease, unspecified: Secondary | ICD-10-CM | POA: Diagnosis not present

## 2021-04-04 DIAGNOSIS — K920 Hematemesis: Secondary | ICD-10-CM | POA: Diagnosis not present

## 2021-04-04 LAB — RENAL FUNCTION PANEL
Albumin: 2.1 g/dL — ABNORMAL LOW (ref 3.5–5.0)
Anion gap: 13 (ref 5–15)
BUN: 20 mg/dL (ref 8–23)
CO2: 23 mmol/L (ref 22–32)
Calcium: 8 mg/dL — ABNORMAL LOW (ref 8.9–10.3)
Chloride: 101 mmol/L (ref 98–111)
Creatinine, Ser: 3.49 mg/dL — ABNORMAL HIGH (ref 0.61–1.24)
GFR, Estimated: 17 mL/min — ABNORMAL LOW (ref >60)
Glucose, Bld: 66 mg/dL — ABNORMAL LOW (ref 70–99)
Phosphorus: 4.4 mg/dL (ref 2.5–4.6)
Potassium: 3.7 mmol/L (ref 3.5–5.1)
Sodium: 137 mmol/L (ref 135–145)

## 2021-04-04 MED ORDER — HYDROCODONE-ACETAMINOPHEN 10-325 MG PO TABS
1.0000 | ORAL_TABLET | ORAL | Status: DC | PRN
  Administered 2021-04-04 (×2): 1 via ORAL
  Filled 2021-04-04 (×2): qty 1

## 2021-04-04 MED ORDER — LANTHANUM CARBONATE 1000 MG PO CHEW: 1000.0000 mg | CHEWABLE_TABLET | Freq: Three times a day (TID) | ORAL | 0 refills | Status: DC

## 2021-04-04 MED ORDER — HYDROCODONE-ACETAMINOPHEN 10-325 MG PO TABS: 1.0000 | ORAL_TABLET | ORAL | 0 refills | Status: DC | PRN

## 2021-04-04 MED ORDER — HYDRALAZINE HCL 10 MG PO TABS: 10.0000 mg | ORAL_TABLET | Freq: Three times a day (TID) | ORAL | 0 refills | Status: DC | PRN

## 2021-04-04 NOTE — Progress Notes (Signed)
Inpatient Diabetes Program Recommendations  AACE/ADA: New Consensus Statement on Inpatient Glycemic Control (2015)  Target Ranges:  Prepandial:   less than 140 mg/dL      Peak postprandial:   less than 180 mg/dL (1-2 hours)      Critically ill patients:  140 - 180 mg/dL   Lab Results  Component Value Date   GLUCAP 141 (H) 08/15/2020   HGBA1C 7.3 (H) 07/09/2020    Review of Glycemic Control  Latest Reference Range & Units 04/04/21 03:49  Glucose 70 - 99 mg/dL 66 (L)  (L): Data is abnormally low Diabetes history: Type 2 DM Outpatient Diabetes medications: None Current orders for Inpatient glycemic control: none  Inpatient Diabetes Program Recommendations:    Consider adding CBGs TID & HS.   Thanks, Bronson Curb, MSN, RNC-OB Diabetes Coordinator [phone] (8a-5p)

## 2021-04-04 NOTE — Progress Notes (Signed)
Patient ID: [place], male   DOB: 1934-10-18, 87 y.o.   MRN: [id number] [facility] Progress Note   Assessment/ Plan:   ESRD: new to dialysis. Baseline creatinine around 2.0- 2.8, pt was admitted w/ creat 4.8 and progressed to 7.3, likely due to cardiorenal mechanism with poor response to diuresis. Decision was made to start patient on HD.  IR placed TDC on 2/06 and pt had 1st HD 2/06 and 2nd HD 2/07. SP R BC AVF by [facility] on 2/09. Pt is not expected to recover renal function, suspect this is ESRD. Pt CLIP'd to MWF at [facility], to start on 2/13.  Nausea/ dry heaves: resolved w/ dc'ing IV lasix and giving back some IVF's Hypertension: Blood pressures okay on clonidine, amlodipine and metoprolol. Vol overload has resolved, see above also.  Anemia ckd: Without overt blood loss and likely secondary to chronic illness.  Iron stores replete. Last HD 9 on 1/27, observe. Consider esa.  Hyperphosphatemia: d/t CKD/ ESRD, improving w/ fosrenol as binder MBD CKD: Ca in range, phos down to 4.7 on binder as above  [name], MD 04/04/2021, 6:08 PM     Subjective:   No c/o's , feeling better today   Objective:   BP (!) 161/74 (BP Location: Left Arm)    Pulse 77    Temp 98.7 F (37.1 C)    Resp 19    Ht 6\' 2"  (1.88 m)    Wt 89.7 kg    SpO2 94%    BMI 25.39 kg/m   Intake/Output Summary (Last 24 hours) at 04/04/2021 1808 Last data filed at 04/04/2021 1700 Gross per 24 hour  Intake 840 ml  Output 1125 ml  Net -285 ml    Weight change: -0.9 kg  Physical Exam: Gen: Comfortably seen in HD CVS: Pulse regular rhythm, normal rate, S1 and S2 normal Resp: Anteriorly clear to auscultation bilaterally, no distinct rales or rhonchi Abd: Soft, obese, nontender Ext: trace edema pretib, lower legs are wrapped, no hip or UE edema  RIJ TDC   Labs: BMET Recent Labs  Lab 03/29/21 1002 03/30/21 0136 03/31/21 0504 04/01/21 0314 04/02/21 0305 04/03/21 0104 04/04/21 0349  NA 135 138 137  141 136 139 137  K 4.7 4.6 4.4 4.4 5.4* 4.0 3.7  CL 106 106 104 104 103 103 101  CO2 17* 20* 19* 23 17* 21* 23  GLUCOSE 114* 97 80 73 71 77 66*  BUN 83* 80* 82* 56* 33* 40* 20  CREATININE 6.95* 7.11* 7.32* 5.73* 4.33* 5.29* 3.49*  CALCIUM 8.0* 8.0* 7.8* 7.9* 7.9* 8.0* 8.0*  PHOS  --  6.9* 7.3* 5.7* 4.7* 5.8* 4.4    CBC Recent Labs  Lab 03/31/21 0459 04/01/21 0857  WBC 6.1 8.1  HGB 9.5* 9.6*  HCT 31.0* 30.8*  MCV 92.5 93.1  PLT 252 275     Medications:     amLODipine  10 mg Oral Daily   aspirin EC  81 mg Oral Daily   Chlorhexidine Gluconate Cloth  6 each Topical Q0600   cloNIDine  0.1 mg Oral Daily   docusate sodium  100 mg Oral BID   ferrous sulfate  325 mg Oral BID WC   heparin  5,000 Units Subcutaneous Q8H   hydrocerin   Topical Once per day on Mon Thu   lanthanum  1,000 mg Oral TID WC   levothyroxine  50 mcg Oral Q0600   lidocaine  1 patch Transdermal Q24H   melatonin  3  mg Oral QHS   metoprolol tartrate  25 mg Oral Daily   mupirocin ointment  1 application Nasal BID   polyethylene glycol  17 g Oral Daily   pravastatin  40 mg Oral QHS

## 2021-04-04 NOTE — Discharge Summary (Addendum)
Physician Discharge Summary  Wayne Meadows AYT:[id number] DOB: 09/19/1934 DOA: 03/17/2021  PCP: [name]  Admit date: 03/17/2021 Discharge date: 04/04/2021  Admitted From: Home Disposition: SNF  Recommendations for Outpatient Follow-up:  Follow up with PCP in 1-2 weeks Please obtain BMP/CBC in one week  Discharge Condition: Stable CODE STATUS: Full Diet recommendation: Diabetic diet as tolerated  Brief/Interim Summary: Wayne Meadows is a 87 y.o. male with history of chronic kidney disease stage IIIb, CVA and CAD, diabetes mellitus, COPD, hypertension was recently admitted for cellulitis of the lower extremity, discharged on antibiotic presented to hospital with increasing weakness, difficulty ambulating with increasing redness and swelling of his lower extremities.  In the ED, patient was noted to have weeping of his bilateral lower extremities with erythema.  Creatinine had worsened from 2.9 to 4.8 on presentation.   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  Nephrology was consulted during hospitalization for management of fluid overload and renal failure.   2/6 status post right IJ tunneled HD catheter placed by IR today 2/7 tolerated HD 2/8 complaining of nausea 2/9 RUE Brachiocephalic AV fistula placement   Assessment & Plan:   New onset ESRD with non-anion gap metabolic acidosis, resolving: Nephrology and vascular following, appreciate insight recommendations Right IJ catheter 2/6 - RUE brachiocephalic fistula 2/9  Plavix on hold until cleared by vascular at follow-up   Gross bilateral lower extremity edema anasarca/venous insufficiency.   Secondary to above Improving with fluid management with dialysis   CAD, chronic History of coronary stent placement. Continue aspirin; Holding Plavix for primary dialysis access placement by vascular     Essential hypertension  Continue clonidine, amlodipine, metoprolol   Hypothyroidism, chronic Continue Synthroid   COPD, chronic  Compensated at this time.  Continue bronchodilators.   Chronic anemia, of chronic disease, stable Appears to be at baseline around 9-10.   Debility, deconditioning Ambulatory dysfunction, chronic Uses a walker and cane at home  -PT recommending SNF     Discharge Diagnoses:  No new Assessment & Plan notes have been filed under this hospital service since the last note was generated. Service: Hospitalist  Discharge Instructions   Allergies as of 04/04/2021       Reactions   Bee Venom Anaphylaxis   Has epi pen   Influenza Vaccines Other (See Comments)   "Mortally sick for 2 weeks"        Medication List     STOP taking these medications    baclofen 10 MG tablet Commonly known as: LIORESAL   clopidogrel 75 MG tablet Commonly known as: PLAVIX   diclofenac Sodium 1 % Gel Commonly known as: VOLTAREN   EPINEPHrine 0.3 mg/0.3 mL Devi Commonly known as: EPI-PEN   furosemide 40 MG tablet Commonly known as: LASIX   gabapentin 300 MG capsule Commonly known as: NEURONTIN   LORazepam 0.5 MG tablet Commonly known as: Ativan   nitroGLYCERIN 0.4 MG SL tablet Commonly known as: NITROSTAT       TAKE these medications    albuterol 108 (90 Base) MCG/ACT inhaler Commonly known as: VENTOLIN HFA Inhale 1 puff into the lungs every 6 (six) hours as needed for shortness of breath.   amLODipine 10 MG tablet Commonly known as: NORVASC Take 1 tablet (10 mg total) by mouth daily.   aspirin EC 81 MG tablet Take 81 mg by mouth daily.   cloNIDine 0.1 MG tablet  Commonly known as: CATAPRES Take 1 tablet (0.1 mg total) by mouth daily.   ferrous sulfate 325 (65 FE) MG tablet Take 1 tablet by mouth. Every Monday, Wednesday, and Friday   hydrALAZINE 10 MG tablet Commonly known as: APRESOLINE Take 1 tablet (10 mg total) by mouth every 8  (eight) hours as needed (for SBP greater than 180).   HYDROcodone-acetaminophen 10-325 MG tablet Commonly known as: NORCO Take 1 tablet by mouth every 4 (four) hours as needed for up to 5 days for severe pain. What changed:  when to take this reasons to take this   Ipratropium-Albuterol 20-100 MCG/ACT Aers respimat Commonly known as: COMBIVENT Inhale 1 puff into the lungs 4 (four) times daily.   lanthanum 1000 MG chewable tablet Commonly known as: FOSRENOL Chew 1 tablet (1,000 mg total) by mouth 3 (three) times daily with meals.   levothyroxine 50 MCG tablet Commonly known as: SYNTHROID Take 50 mcg by mouth daily before breakfast.   metoprolol tartrate 25 MG tablet Commonly known as: LOPRESSOR Take 25 mg by mouth daily.   multivitamin tablet Take 1 tablet by mouth daily.   polyethylene glycol 17 g packet Commonly known as: MIRALAX / GLYCOLAX Take 17 g by mouth daily as needed for mild constipation or moderate constipation.   pravastatin 40 MG tablet Commonly known as: PRAVACHOL Take 40 mg by mouth at bedtime.        [facility] Go on 04/07/2021.   Why: Schedule is Monday/Wednesday/Friday with 1:00 chair time.  For first appointment, pt will need to arrive at 12:00 to complete paperwork prior to treatment.  After first treatment, pt will need to arrive at 12:40. Contact information: [place] [address] [phone]                Allergies  Allergen Reactions   Bee Venom Anaphylaxis    Has epi pen   Influenza Vaccines Other (See Comments)    "Mortally sick for 2 weeks"    Consultations: Nephrology  Procedures/Studies: DG Chest 2 View  Result Date: 03/27/2021 CLINICAL DATA:  Weakness, COPD, former smoker EXAM: CHEST - 2 VIEW COMPARISON:  Chest x-ray 03/09/2021. FINDINGS: The heart and mediastinal contours are unchanged. Aortic calcification. Bibasilar vague airspace opacities. Chronic coarsened  insert markings with no overt pulmonary edema. Bilateral at least small volume pleural effusions. Effusion. No pneumothorax. No acute osseous abnormality. IMPRESSION: 1. Bilateral at least small volume pleural effusions. 2. Bibasilar vague airspace opacities may represent a combination of atelectasis versus infection/inflammation. 3. Aortic Atherosclerosis (ICD10-I70.0) and Emphysema (ICD10-J43.9). Electronically Signed   By: Iven Finn M.D.   On: 03/27/2021 15:05   DG Abd 1 View  Result Date: 03/18/2021 CLINICAL DATA:  Increased weakness and distension EXAM: ABDOMEN - 1 VIEW COMPARISON:  10/26/2011 KUB, CT abdomen 08/12/2020 FINDINGS: Gaseous distension of the small bowel and colon. No evidence of pneumoperitoneum, portal venous gas or pneumatosis. No pathologic calcifications along the expected course of the ureters. No acute osseous abnormality. Posterior spinal fusion from L2 through S1. Right hip arthroplasty. IMPRESSION: Gaseous distension of the small bowel and colon which may reflect an ileus versus partial bowel obstruction. Electronically Signed   By: Kathreen Devoid M.D.   On: 03/18/2021 06:35   NM Pulmonary Perfusion  Result Date: 03/09/2021 CLINICAL DATA:  Positive D-dimer.  History of COPD. EXAM: NUCLEAR MEDICINE PERFUSION LUNG SCAN TECHNIQUE: Perfusion images were obtained in multiple projections after intravenous injection of  radiopharmaceutical. Ventilation scans intentionally deferred if perfusion scan and chest x-ray adequate for interpretation during COVID 19 epidemic. RADIOPHARMACEUTICALS:  4.4 mCi Tc-74mMAA IV COMPARISON:  03/07/2021.  Current chest radiograph is pending. FINDINGS: There are no segmental perfusion defects to suggest pulmonary thromboembolism. Nonsegmental heterogeneous areas of relative decreased perfusion are noted, mostly in the upper lobes, consistent with emphysema. IMPRESSION: No evidence of a pulmonary embolism. Electronically Signed   By: DLajean ManesM.D.    On: 03/09/2021 14:42   [place]  Result Date: 03/18/2021 CLINICAL DATA:  Acute renal failure. EXAM: RENAL / URINARY TRACT ULTRASOUND COMPLETE COMPARISON:  Renal ultrasound dated 03/05/2021. FINDINGS: Right Kidney: Renal measurements: 11.4 x 5.9 x 6.3 cm = volume: 224 mL. Moderate parenchyma atrophy. There is diffuse increased renal echogenicity. No hydronephrosis or shadowing stone. Several cysts measure up to 4 cm in the upper pole. Left Kidney: Renal measurements: 9.6 x 5.8 x 5.7 cm = volume: 165 mL. Moderate parenchyma atrophy. Increased echogenicity. No hydronephrosis or shadowing stone. Several cysts measure up to 3.5 cm. Bladder: Appears normal for degree of bladder distention. Other: None. IMPRESSION: 1. Atrophic and echogenic kidneys in keeping with chronic kidney disease. No hydronephrosis or shadowing stone. 2. Bilateral renal cysts. Electronically Signed   By: AAnner CreteM.D.   On: 03/18/2021 03:18   [place]  Result Date: 03/05/2021 CLINICAL DATA:  Acute kidney injury, history of left kidney biopsy and partial left nephrectomy. EXAM: RENAL / URINARY TRACT ULTRASOUND COMPLETE COMPARISON:  No prior renal ultrasound, correlation is made with abdomen ultrasound 03/16/2008 and CT abdomen pelvis 08/12/2020 FINDINGS: Right Kidney: Renal measurements: 10.5 x 5.9 x 6.8 cm = volume: 221 mL. Echogenicity is increased. No hydronephrosis visualized. Multiple anechoic lesions are seen, compatible with renal cysts, the largest of which measures up to 4.2 x 4.0 x 3.7 cm. Left Kidney: Renal measurements: 10.2 x 5.9 x 5.5 cm = volume: 174 mL. Echogenicity is increased. No hydronephrosis visualized. Multiple anechoic lesions are seen, compatible with renal cysts, the largest of which measures up to 3.2 x 2.5 x 2.8 cm Bladder: Appears normal for degree of bladder distention. Other: None. IMPRESSION: 1. Increased renal echogenicity bilaterally, compatible with medical renal disease. 2. Multiple anechoic lesions  in the bilateral kidneys, compatible with renal cysts. Electronically Signed   By: AMerilyn BabaM.D.   On: 03/05/2021 12:41   IR Fluoro Guide CV Line Right  Result Date: 03/31/2021 INDICATION: 87year old male with progressive renal dysfunction in need of acute hemodialysis. He presents for EXAM: TUNNELED CENTRAL VENOUS HEMODIALYSIS CATHETER PLACEMENT WITH ULTRASOUND AND FLUOROSCOPIC GUIDANCE MEDICATIONS: 2 g Ancef. The antibiotic was given in an appropriate time interval prior to skin puncture. ANESTHESIA/SEDATION: Moderate (conscious) sedation was employed during this procedure. A total of Versed 1 mg and Fentanyl 25 mcg was administered intravenously. Moderate Sedation Time: 17 minutes. The patient's level of consciousness and vital signs were monitored continuously by radiology nursing throughout the procedure under my direct supervision. FLUOROSCOPY TIME:  2 mGy, air kerma COMPLICATIONS: None immediate. PROCEDURE: Informed written consent was obtained from the patient after a discussion of the risks, benefits, and alternatives to treatment. Questions regarding the procedure were encouraged and answered. The right neck and chest were prepped with chlorhexidine in a sterile fashion, and a sterile drape was applied covering the operative field. Maximum barrier sterile technique with sterile gowns and gloves were used for the procedure. A timeout was performed prior to the initiation of the procedure. After creating  a small venotomy incision, a micropuncture kit was utilized to access the right internal jugular vein under direct, real-time ultrasound guidance after the overlying soft tissues were anesthetized with 1% lidocaine with epinephrine. Ultrasound image documentation was performed. The microwire was kinked to measure appropriate catheter length. A stiff Glidewire was advanced to the level of the IVC and the micropuncture sheath was exchanged for a peel-away sheath. A Palindrome tunneled hemodialysis  catheter measuring 23 cm from tip to cuff was tunneled in a retrograde fashion from the anterior chest wall to the venotomy incision. The catheter was then placed through the peel-away sheath with tips ultimately positioned within the superior aspect of the right atrium. Final catheter positioning was confirmed and documented with a spot radiographic image. The catheter aspirates and flushes normally. The catheter was flushed with appropriate volume heparin dwells. The catheter exit site was secured with a 0-Prolene retention suture. The venotomy incision was closed with an interrupted 4-0 Vicryl, Dermabond and Steri-strips. Dressings were applied. The patient tolerated the procedure well without immediate post procedural complication. IMPRESSION: Successful placement of 23 cm tip to cuff tunneled hemodialysis catheter via the right internal jugular vein with tips terminating within the superior aspect of the right atrium. The catheter is ready for immediate use. Electronically Signed   By: Jacqulynn Cadet M.D.   On: 03/31/2021 10:48   DG CHEST PORT 1 VIEW  Result Date: 03/09/2021 CLINICAL DATA:  Cough EXAM: PORTABLE CHEST 1 VIEW COMPARISON:  03/07/2021 FINDINGS: Two frontal views of the chest demonstrate a stable cardiac silhouette. The lungs are hyperinflated with background interstitial prominence consistent with emphysema. No acute airspace disease, effusion, or pneumothorax. No acute bony abnormalities. IMPRESSION: 1. Stable emphysema.  No acute process. Electronically Signed   By: Randa Ngo M.D.   On: 03/09/2021 16:06   DG CHEST PORT 1 VIEW  Result Date: 03/07/2021 CLINICAL DATA:  Cough EXAM: PORTABLE CHEST 1 VIEW COMPARISON:  03/04/2021 FINDINGS: Single frontal view of the chest demonstrates a stable cardiac silhouette. Lungs are hyperinflated with chronic bilateral scarring unchanged. No acute airspace disease, effusion, or pneumothorax. No acute bony abnormality. IMPRESSION: 1. Emphysema, no  acute intrathoracic process. Electronically Signed   By: Randa Ngo M.D.   On: 03/07/2021 15:01   VAS [place] ABI WITH/WO TBI  Result Date: 03/06/2021  LOWER EXTREMITY DOPPLER STUDY Patient Name:  ROMELL WOLDEN  Date of Exam:   03/06/2021 Medical Rec #: [id number]         Accession #:    [id number] Date of Birth: 10/28/1934        Patient Gender: M Patient Age:   42 years Exam Location:  [facility] [place] Procedure:      VAS [place] ABI WITH/WO TBI Referring Phys: A POWELL JR --------------------------------------------------------------------------------  Indications: Cellulitis. High Risk Factors: Hypertension, coronary artery disease.  Comparison Study: No prior studies. Performing Technologist: Carlos Levering RVT  Examination Guidelines: A complete evaluation includes at minimum, Doppler waveform signals and systolic blood pressure reading at the level of bilateral brachial, anterior tibial, and posterior tibial arteries, when vessel segments are accessible. Bilateral testing is considered an integral part of a complete examination. Photoelectric Plethysmograph (PPG) waveforms and toe systolic pressure readings are included as required and additional duplex testing as needed. Limited examinations for reoccurring indications may be performed as noted.  ABI Findings: +---------+------------------+-----+--------+--------+  Right     Rt Pressure (mmHg) Index Waveform Comment   +---------+------------------+-----+--------+--------+  Brachial  173                                         +---------+------------------+-----+--------+--------+  PTA       230                1.26  biphasic           +---------+------------------+-----+--------+--------+  DP        219                1.20  biphasic           +---------+------------------+-----+--------+--------+  Great Toe 85                 0.46                     +---------+------------------+-----+--------+--------+  +---------+------------------+-----+---------+-------+  Left      Lt Pressure (mmHg) Index Waveform  Comment  +---------+------------------+-----+---------+-------+  Brachial  183                      triphasic          +---------+------------------+-----+---------+-------+  PTA       189                1.03  biphasic           +---------+------------------+-----+---------+-------+  DP        186                1.02  biphasic           +---------+------------------+-----+---------+-------+  Great Toe 105                0.57                     +---------+------------------+-----+---------+-------+ +-------+-----------+-----------+------------+------------+  ABI/TBI Today's ABI Today's TBI Previous ABI Previous TBI  +-------+-----------+-----------+------------+------------+  Right   1.26                                               +-------+-----------+-----------+------------+------------+  Left    1.03                                               +-------+-----------+-----------+------------+------------+  Summary: Right: Resting right ankle-brachial index is within normal range. No evidence of significant right lower extremity arterial disease. The right toe-brachial index is abnormal. Left: Resting left ankle-brachial index is within normal range. No evidence of significant left lower extremity arterial disease. The left toe-brachial index is abnormal.  *See table(s) above for measurements and observations.  Electronically signed by Orlie Pollen on 03/06/2021 at 3:57:06 PM.    Final    ECHOCARDIOGRAM COMPLETE  Result Date: 03/08/2021    ECHOCARDIOGRAM REPORT   Patient Name:   PAXON PROPES Date of Exam: 03/08/2021 Medical Rec #:  [id number]        Height:       74.0 in Accession #:    [id number]       Weight:       195.0 lb Date of Birth:  10/13/1934       BSA:          2.150 m Patient Age:    17 years         BP:           172/66  mmHg Patient Gender: M                HR:           90 bpm. Exam Location:   Inpatient Procedure: 2D Echo Indications:    "Elevated brain natriuretic peptide level"  History:        Patient has no prior history of Echocardiogram examinations.                 CAD, COPD and Stroke; Risk Factors:Hypertension.  Sonographer:    Arlyss Gandy Referring Phys: [place]  1. Left ventricular ejection fraction, by estimation, is 65 to 70%. The left ventricle has hyperdynamic function. The left ventricle has no regional wall motion abnormalities. There is mild concentric left ventricular hypertrophy. Left ventricular diastolic parameters are consistent with Grade I diastolic dysfunction (impaired relaxation).  2. Right ventricular systolic function is normal. The right ventricular size is normal. There is mildly elevated pulmonary artery systolic pressure.  3. Left atrial size was mildly dilated.  4. 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 can't rule out PFO.  5. Right atrial size was mildly dilated.  6. The mitral valve is normal in structure. No evidence of mitral valve regurgitation.  7. The aortic valve was not well visualized. Aortic valve regurgitation is mild to moderate.  8. The inferior vena cava is normal in size with greater than 50% respiratory variability, suggesting right atrial pressure of 3 mmHg. Comparison(s): No prior Echocardiogram. FINDINGS  Left Ventricle: Left ventricular ejection fraction, by estimation, is 65 to 70%. The left ventricle has hyperdynamic function. The left ventricle has no regional wall motion abnormalities. The left ventricular internal cavity size was normal in size. There is mild concentric left ventricular hypertrophy. Left ventricular diastolic parameters are consistent with Grade I diastolic dysfunction (impaired relaxation). Right Ventricle: The right ventricular size is normal. No increase in right ventricular wall thickness. Right ventricular systolic function is normal. There is mildly elevated  pulmonary artery systolic pressure. The tricuspid regurgitant velocity is 2.96  m/s, and with an assumed right atrial pressure of 3 mmHg, the estimated right ventricular systolic pressure is 81.2 mmHg. Left Atrium: Left atrial size was mildly dilated. Right Atrium: Right atrial size was mildly dilated. Pericardium: There is no evidence of pericardial effusion. Mitral Valve: The mitral valve is normal in structure. No evidence of mitral valve regurgitation. Tricuspid Valve: The tricuspid valve is normal in structure. Tricuspid valve regurgitation is mild. Aortic Valve: The aortic valve was not well visualized. Aortic valve regurgitation is mild to moderate. Aortic valve mean gradient measures 8.0 mmHg. Aortic valve peak gradient measures 12.8 mmHg. Aortic valve area, by VTI measures 2.49 cm. Pulmonic Valve: The pulmonic valve was not well visualized. Aorta: The aortic root and ascending aorta are structurally normal, with no evidence of dilitation. Venous: The inferior vena cava is normal in size with greater than 50% respiratory variability, suggesting right atrial pressure of 3 mmHg. IAS/Shunts: Can't rule out PFO.  LEFT VENTRICLE PLAX 2D LVIDd:         4.40 cm   Diastology LVIDs:         3.00 cm   LV e' medial:    7.51 cm/s LV PW:         1.10 cm   LV E/e' medial:  11.0 LV IVS:        1.20 cm   LV e' lateral:   8.81  cm/s LVOT diam:     2.20 cm   LV E/e' lateral: 9.4 LV SV:         88 LV SV Index:   41 LVOT Area:     3.80 cm  RIGHT VENTRICLE             IVC RV Basal diam:  4.00 cm     IVC diam: 2.30 cm RV S prime:     21.10 cm/s TAPSE (M-mode): 2.3 cm LEFT ATRIUM             Index        RIGHT ATRIUM           Index LA diam:        4.70 cm 2.19 cm/m   RA Area:     19.80 cm LA Vol (A2C):   94.9 ml 44.14 ml/m  RA Volume:   55.60 ml  25.86 ml/m LA Vol (A4C):   68.2 ml 31.72 ml/m LA Biplane Vol: 83.0 ml 38.60 ml/m  AORTIC VALVE AV Area (Vmax):    2.59 cm AV Area (Vmean):   2.39 cm AV Area (VTI):     2.49 cm  AV Vmax:           179.00 cm/s AV Vmean:          134.000 cm/s AV VTI:            0.353 m AV Peak Grad:      12.8 mmHg AV Mean Grad:      8.0 mmHg LVOT Vmax:         122.00 cm/s LVOT Vmean:        84.400 cm/s LVOT VTI:          0.231 m LVOT/AV VTI ratio: 0.65  AORTA Ao Root diam: 3.00 cm Ao Asc diam:  2.60 cm MITRAL VALVE               TRICUSPID VALVE MV Area (PHT): 2.71 cm    TR Peak grad:   35.0 mmHg MV Decel Time: 280 msec    TR Vmax:        296.00 cm/s MV E velocity: 82.70 cm/s MV A velocity: 97.70 cm/s  SHUNTS MV E/A ratio:  0.85        Systemic VTI:  0.23 m                            Systemic Diam: 2.20 cm Phineas Inches Electronically signed by Phineas Inches Signature Date/Time: 03/08/2021/1:31:12 PM    Final    VAS [place] LOWER EXTREMITY VENOUS (DVT)  Result Date: 03/08/2021  Lower Venous DVT Study Patient Name:  LOY MCCARTT  Date of Exam:   03/08/2021 Medical Rec #: [id number]         Accession #:    [id number] Date of Birth: [birth year]        Patient Gender: M Patient Age:   68 years Exam Location:  [facility] Procedure:      VAS [place] LOWER EXTREMITY VENOUS (DVT) Referring Phys: A POWELL JR --------------------------------------------------------------------------------  Indications: Elevated d-dimer, cellulitis, suspected PE.  Anticoagulation: Heparin. Limitations: Bilateral bandaging of mid-lower calves. Comparison Study: No prior studies. Performing Technologist: Darlin Coco RDMS, RVT  Examination Guidelines: A complete evaluation includes B-mode imaging, spectral Doppler, color Doppler, and power Doppler as needed of all accessible portions of each vessel. Bilateral testing is considered an integral part of a  complete examination. Limited examinations for reoccurring indications may be performed as noted. The reflux portion of the exam is performed with the patient in reverse Trendelenburg.  +---------+---------------+---------+-----------+----------+-------------------+  RIGHT      Compressibility Phasicity Spontaneity Properties Thrombus Aging       +---------+---------------+---------+-----------+----------+-------------------+  CFV       Full            Yes       Yes                                         +---------+---------------+---------+-----------+----------+-------------------+  SFJ       Full                                                                  +---------+---------------+---------+-----------+----------+-------------------+  FV Prox   Full                                                                  +---------+---------------+---------+-----------+----------+-------------------+  FV Mid    Full                                                                  +---------+---------------+---------+-----------+----------+-------------------+  FV Distal Full                                                                  +---------+---------------+---------+-----------+----------+-------------------+  PFV       Full                                                                  +---------+---------------+---------+-----------+----------+-------------------+  POP       Full            Yes       Yes                                         +---------+---------------+---------+-----------+----------+-------------------+  PTV  Limited views of                                                                 proximal segment                                                                 patent by color      +---------+---------------+---------+-----------+----------+-------------------+  PERO                                                       Limited views of                                                                 proximal segment                                                                 patent by color      +---------+---------------+---------+-----------+----------+-------------------+    +---------+---------------+---------+-----------+----------+-------------------+  LEFT      Compressibility Phasicity Spontaneity Properties Thrombus Aging       +---------+---------------+---------+-----------+----------+-------------------+  CFV       Full            Yes       Yes                                         +---------+---------------+---------+-----------+----------+-------------------+  SFJ       Full                                                                  +---------+---------------+---------+-----------+----------+-------------------+  FV Prox   Full                                                                  +---------+---------------+---------+-----------+----------+-------------------+  FV Mid    Full                                                                  +---------+---------------+---------+-----------+----------+-------------------+  FV Distal Full                                                                  +---------+---------------+---------+-----------+----------+-------------------+  PFV       Full                                                                  +---------+---------------+---------+-----------+----------+-------------------+  POP       Full            Yes       Yes                                         +---------+---------------+---------+-----------+----------+-------------------+  PTV                                                        Limited views of                                                                 proximal segment                                                                 patent by color      +---------+---------------+---------+-----------+----------+-------------------+  PERO                                                       Limited views of                                                                 proximal segment  patent by color       +---------+---------------+---------+-----------+----------+-------------------+     Summary: RIGHT: - There is no evidence of deep vein thrombosis in the lower extremity. However, portions of this examination were limited- see technologist comments above.  - No cystic structure found in the popliteal fossa.  LEFT: - There is no evidence of deep vein thrombosis in the lower extremity. However, portions of this examination were limited- see technologist comments above.  - No cystic structure found in the popliteal fossa.  *See table(s) above for measurements and observations. Electronically signed by Jamelle Haring on 03/08/2021 at 3:14:03 PM.    Final    VAS [place] UPPER EXT VEIN MAPPING (PRE-OP AVF)  Result Date: 04/02/2021 UPPER EXTREMITY VEIN MAPPING Patient Name:  HUTCHINSON ISENBERG  Date of Exam:   04/02/2021 Medical Rec #: [id number]         Accession #:    [id number] Date of Birth: 11-21-1934        Patient Gender: M Patient Age:   24 years Exam Location:  [facility] Procedure:      VAS [place] UPPER EXT VEIN MAPPING (PRE-OP AVF) Referring Phys: Dagoberto Ligas --------------------------------------------------------------------------------  Indications: Pre-access. Comparison Study: No previous exams Performing Technologist: Jody Hill RVT, RDMS  Examination Guidelines: A complete evaluation includes B-mode imaging, spectral Doppler, color Doppler, and power Doppler as needed of all accessible portions of each vessel. Bilateral testing is considered an integral part of a complete examination. Limited examinations for reoccurring indications may be performed as noted. +-----------------+-------------+----------+--------------------------+  Right Cephalic    Diameter (cm) Depth (cm)          Findings           +-----------------+-------------+----------+--------------------------+  Shoulder              0.36         0.71                                 +-----------------+-------------+----------+--------------------------+  Prox upper arm        0.38         0.63                                +-----------------+-------------+----------+--------------------------+  Mid upper arm         0.32         0.36                                +-----------------+-------------+----------+--------------------------+  Dist upper arm        0.31         0.34                                +-----------------+-------------+----------+--------------------------+  Antecubital fossa     0.45         0.30                                +-----------------+-------------+----------+--------------------------+  Prox forearm                               not visualized and IV/TAPE  +-----------------+-------------+----------+--------------------------+  Mid forearm           0.36         0.43                                +-----------------+-------------+----------+--------------------------+  Dist forearm          0.31         0.40                                +-----------------+-------------+----------+--------------------------+  Wrist                 0.25         0.51            branching           +-----------------+-------------+----------+--------------------------+ +-----------------+-------------+----------+---------+  Right Basilic     Diameter (cm) Depth (cm) Findings   +-----------------+-------------+----------+---------+  Prox upper arm        0.70                            +-----------------+-------------+----------+---------+  Mid upper arm         0.61                            +-----------------+-------------+----------+---------+  Dist upper arm        0.52                            +-----------------+-------------+----------+---------+  Antecubital fossa     0.58                            +-----------------+-------------+----------+---------+  Prox forearm          0.37                 branching  +-----------------+-------------+----------+---------+  Mid  forearm           0.32                            +-----------------+-------------+----------+---------+  Distal forearm        0.27                            +-----------------+-------------+----------+---------+  Wrist                 0.20                            +-----------------+-------------+----------+---------+ +-----------------+-------------+----------+---------+  Left Cephalic     Diameter (cm) Depth (cm) Findings   +-----------------+-------------+----------+---------+  Shoulder              0.37         0.45               +-----------------+-------------+----------+---------+  Prox upper arm        0.27         0.28               +-----------------+-------------+----------+---------+  Mid upper arm         0.28  0.28               +-----------------+-------------+----------+---------+  Dist upper arm        0.32         0.48               +-----------------+-------------+----------+---------+  Antecubital fossa     0.39         0.53    branching  +-----------------+-------------+----------+---------+  Prox forearm          0.26         0.52               +-----------------+-------------+----------+---------+  Mid forearm           0.32         0.47               +-----------------+-------------+----------+---------+  Dist forearm          0.27         0.55               +-----------------+-------------+----------+---------+  Wrist                 0.33         0.45    branching  +-----------------+-------------+----------+---------+ +-----------------+-------------+----------+---------+  Left Basilic      Diameter (cm) Depth (cm) Findings   +-----------------+-------------+----------+---------+  Prox upper arm        0.56                            +-----------------+-------------+----------+---------+  Mid upper arm         0.55                            +-----------------+-------------+----------+---------+  Dist upper arm        0.58                             +-----------------+-------------+----------+---------+  Antecubital fossa     0.48                 branching  +-----------------+-------------+----------+---------+  Prox forearm          0.27                            +-----------------+-------------+----------+---------+  Mid forearm           0.27                            +-----------------+-------------+----------+---------+  Distal forearm        0.27                            +-----------------+-------------+----------+---------+  Wrist                 0.32                            +-----------------+-------------+----------+---------+ *See table(s) above for measurements and observations.  Diagnosing physician: Harold Barban MD Electronically signed by Harold Barban MD on 04/02/2021 at 8:54:40 PM.    Final      Subjective: No acute issues or events overnight tolerated procedure well pain  currently well controlled   Discharge Exam: Vitals:   04/04/21 0446 04/04/21 0927  BP: (!) 171/63 (!) 192/60  Pulse: 82 84  Resp: 17 19  Temp: 98.4 F (36.9 C) 98.8 F (37.1 C)  SpO2: 95% 94%   Vitals:   04/03/21 2112 04/04/21 0446 04/04/21 0500 04/04/21 0927  BP: (!) 168/61 (!) 171/63  (!) 192/60  Pulse: 73 82  84  Resp: 19 17  19   Temp: 98.5 F (36.9 C) 98.4 F (36.9 C)  98.8 F (37.1 C)  TempSrc: Oral     SpO2: 93% 95%  94%  Weight:   89.7 kg   Height:        General:  Pleasantly resting in bed, No acute distress. HEENT:  Normocephalic atraumatic.  Sclerae nonicteric, noninjected.  Extraocular movements intact bilaterally. Neck: Right IJ HD cath. Lungs:  Clear to auscultate bilaterally without rhonchi, wheeze, or rales. Heart:  Regular rate and rhythm.  Without murmurs, rubs, or gallops. Abdomen:  Soft, nontender, nondistended.  Without guarding or rebound. Extremities: Without cyanosis, clubbing, edema, or obvious deformity.  Right forearm fistula graft wound healing well Vascular:  Dorsalis pedis and posterior tibial pulses  palpable bilaterally. Skin:  Warm and dry, no erythema, no ulcerations.   The results of significant diagnostics from this hospitalization (including imaging, microbiology, ancillary and laboratory) are listed below for reference.     Microbiology: Recent Results (from the past 240 hour(s))  Surgical pcr screen     Status: Abnormal   Collection Time: 04/03/21  7:05 AM   Specimen: Nasal Mucosa; Nasal Swab  Result Value Ref Range Status   MRSA, PCR POSITIVE (A) NEGATIVE Final    Comment: RESULT CALLED TO, READ BACK BY AND VERIFIED WITH: RN Charlene Brooke [phone] MLM    Staphylococcus aureus POSITIVE (A) NEGATIVE Final    Comment: (NOTE) The Xpert SA Assay (FDA approved for NASAL specimens in patients 33 years of age and older), is one component of a comprehensive surveillance program. It is not intended to diagnose infection nor to guide or monitor treatment. Performed at [facility], [place] [address], [place], [place] [address]      Labs: BNP (last 3 results) Recent Labs    03/07/21 1714 03/08/21 0509 03/17/21 2155  BNP 931.2* 915.5* 518.8*   Basic Metabolic Panel: Recent Labs  Lab 03/31/21 0504 04/01/21 0314 04/02/21 0305 04/03/21 0104 04/04/21 0349  NA 137 141 136 139 137  K 4.4 4.4 5.4* 4.0 3.7  CL 104 104 103 103 101  CO2 19* 23 17* 21* 23  GLUCOSE 80 73 71 77 66*  BUN 82* 56* 33* 40* 20  CREATININE 7.32* 5.73* 4.33* 5.29* 3.49*  CALCIUM 7.8* 7.9* 7.9* 8.0* 8.0*  PHOS 7.3* 5.7* 4.7* 5.8* 4.4   Liver Function Tests: Recent Labs  Lab 03/31/21 0504 04/01/21 0314 04/02/21 0305 04/03/21 0104 04/04/21 0349  ALBUMIN 2.0* 2.2* 2.1* 2.1* 2.1*   No results for input(s): LIPASE, AMYLASE in the last 168 hours. No results for input(s): AMMONIA in the last 168 hours. CBC: Recent Labs  Lab 03/31/21 0459 04/01/21 0857  WBC 6.1 8.1  HGB 9.5* 9.6*  HCT 31.0* 30.8*  MCV 92.5 93.1  PLT 252 275   Cardiac Enzymes: No results for input(s): CKTOTAL, CKMB,  CKMBINDEX, TROPONINI in the last 168 hours. BNP: Invalid input(s): POCBNP CBG: No results for input(s): GLUCAP in the last 168 hours. D-Dimer No results for input(s): DDIMER in the last 72  hours. Hgb A1c No results for input(s): HGBA1C in the last 72 hours. Lipid Profile No results for input(s): CHOL, HDL, LDLCALC, TRIG, CHOLHDL, LDLDIRECT in the last 72 hours. Thyroid function studies No results for input(s): TSH, T4TOTAL, T3FREE, THYROIDAB in the last 72 hours.  Invalid input(s): FREET3 Anemia work up No results for input(s): VITAMINB12, FOLATE, FERRITIN, TIBC, IRON, RETICCTPCT in the last 72 hours. Urinalysis    Component Value Date/Time   COLORURINE YELLOW 03/19/2021 1820   APPEARANCEUR HAZY (A) 03/19/2021 1820   LABSPEC 1.015 03/19/2021 1820   PHURINE 5.0 03/19/2021 1820   GLUCOSEU NEGATIVE 03/19/2021 1820   HGBUR MODERATE (A) 03/19/2021 1820   BILIRUBINUR NEGATIVE 03/19/2021 1820   KETONESUR NEGATIVE 03/19/2021 1820   PROTEINUR >=300 (A) 03/19/2021 1820   NITRITE NEGATIVE 03/19/2021 1820   LEUKOCYTESUR NEGATIVE 03/19/2021 1820   Sepsis Labs Invalid input(s): PROCALCITONIN,  WBC,  LACTICIDVEN Microbiology Recent Results (from the past 240 hour(s))  Surgical pcr screen     Status: Abnormal   Collection Time: 04/03/21  7:05 AM   Specimen: Nasal Mucosa; Nasal Swab  Result Value Ref Range Status   MRSA, PCR POSITIVE (A) NEGATIVE Final    Comment: RESULT CALLED TO, READ BACK BY AND VERIFIED WITH: RN Charlene Brooke [phone] MLM    Staphylococcus aureus POSITIVE (A) NEGATIVE Final    Comment: (NOTE) The Xpert SA Assay (FDA approved for NASAL specimens in patients 77 years of age and older), is one component of a comprehensive surveillance program. It is not intended to diagnose infection nor to guide or monitor treatment. Performed at [facility], [place] [address] [place], [place] [address]      Time coordinating discharge: Over 30  minutes  SIGNED:   Little Ishikawa, DO [facility] 04/04/2021, 12:00 PM Pager   If 7PM-7AM, please contact night-coverage [email or web address]

## 2021-04-04 NOTE — TOC Transition Note (Signed)
Transition of Care [facility]) - CM/SW Discharge Note   Patient Details  Name: Wayne Meadows MRN: [id number] Date of Birth: Jun 01, 1934  [facility]) CM/SW Contact:  Milinda Antis, [place] Phone Number: 04/04/2021, 1:41 PM   Clinical Narrative:    Patient will DC to:  [facility] Anticipated DC date: 04/04/2021 Family notified: Yes Transport by: Corey Harold   Per MD patient ready for DC to SN. RN to call report prior to discharge [phone] room 215. RN, patient, patient's family, and facility notified of DC. Discharge Summary and FL2 sent to facility. DC packet on chart. Ambulance transport requested for patient.   CSW will sign off for now as social work intervention is no longer needed. Please consult [place] again if new needs arise.     Final next level of care: Skilled Nursing Facility Barriers to Discharge: Barriers Resolved   Patient Goals and CMS Choice Patient states their goals for this hospitalization and ongoing recovery are:: To get stronger CMS Medicare.gov Compare Post Acute Care list provided to:: Patient Choice offered to / list presented to : Patient  Discharge Placement              Patient chooses bed at: [facility] Patient to be transferred to facility by: [place] Name of family member notified: Lyna Poser (Daughter)   [phone] Patient and family notified of of transfer: 04/04/21  Discharge Plan and Services   Discharge Planning Services: CM Consult Post Acute Care Choice: [place]                               Social Determinants of Health (SDOH) Interventions     Readmission Risk Interventions Readmission Risk Prevention Plan 03/21/2021 03/19/2021  Transportation Screening Complete -  [place] or Home Care Consult Complete -  SW Recovery Care/Counseling Consult Complete Complete  Skilled Nursing Facility Complete Complete  Some recent data might be hidden

## 2021-04-04 NOTE — Progress Notes (Signed)
°  Postoperative hemodialysis access     Date of Surgery:  04/03/2021 Surgeon: Stanford Breed  Subjective:  c/o pain in the arm but no pain in his hand  PHYSICAL EXAMINATION:  Vitals:   04/03/21 2112 04/04/21 0446  BP: (!) 168/61 (!) 171/63  Pulse: 73 82  Resp: 19 17  Temp: 98.5 F (36.9 C) 98.4 F (36.9 C)  SpO2: 93% 95%    Incision is clean Sensation in digits is intact;  There is  Thrill  The fistula is palpable  The radial pulse is palpable   ASSESSMENT/PLAN:  Wayne Meadows is a 87 y.o. year old male who is s/p right BC AVF 04/04/2021 by Dr. Stanford Breed.  - fistula is patent -pt does not have evidence of steal sx -f/u with [facility] in 6 weeks to check maturation of AVF.  Discussed with pt that access does not last forever and may need further interventions or even new access in the future. -our office will arrange appt. -ok to take off sling to avoid frozen shoulder -will sign off-call as needed.   Leontine Locket, PA-C [facility] [phone]

## 2021-04-04 NOTE — Discharge Instructions (Signed)
° °  [facility]  Discharge Instructions  AV Fistula or Graft Surgery for Dialysis Access  Please refer to the following instructions for your post-procedure care. Your surgeon or physician assistant will discuss any changes with you.  Activity  You may drive the day following your surgery, if you are comfortable and no longer taking prescription pain medication. Resume full activity as the soreness in your incision resolves.  Bathing/Showering  You may shower after you go home. Keep your incision dry for 48 hours. Do not soak in a bathtub, hot tub, or swim until the incision heals completely. You may not shower if you have a hemodialysis catheter.  Incision Care  Clean your incision with mild soap and water after 48 hours. Pat the area dry with a clean towel. You do not need a bandage unless otherwise instructed. Do not apply any ointments or creams to your incision. You may have skin glue on your incision. Do not peel it off. It will come off on its own in about one week. Your arm may swell a bit after surgery. To reduce swelling use pillows to elevate your arm so it is above your heart. Your doctor will tell you if you need to lightly wrap your arm with an ACE bandage.  Diet  Resume your normal diet. There are not special food restrictions following this procedure. In order to heal from your surgery, it is CRITICAL to get adequate nutrition. Your body requires vitamins, minerals, and protein. Vegetables are the best source of vitamins and minerals. Vegetables also provide the perfect balance of protein. Processed food has little nutritional value, so try to avoid this.  Medications  Resume taking all of your medications. If your incision is causing pain, you may take over-the counter pain relievers such as acetaminophen (Tylenol). If you were prescribed a stronger pain medication, please be aware these medications can cause nausea and constipation. Prevent  nausea by taking the medication with a snack or meal. Avoid constipation by drinking plenty of fluids and eating foods with high amount of fiber, such as fruits, vegetables, and grains.  Do not take Tylenol if you are taking prescription pain medications.  Follow up Your surgeon may want to see you in the office following your access surgery. If so, this will be arranged at the time of your surgery.  Please call us immediately for any of the following conditions:  Increased pain, redness, drainage (pus) from your incision site Fever of 101 degrees or higher Severe or worsening pain at your incision site Hand pain or numbness.  Reduce your risk of vascular disease:  Stop smoking. If you would like help, call [facility] at 1-800-QUIT-NOW 4172701707) or [place] at [place] your cholesterol Maintain a desired weight Control your diabetes Keep your blood pressure down  Dialysis  It will take several weeks to several months for your new dialysis access to be ready for use. Your surgeon will determine when it is okay to use it. Your nephrologist will continue to direct your dialysis. You can continue to use your Permcath until your new access is ready for use.   04/04/2021 Wayne Meadows [id number] 23-Jun-1934  Surgeon(s): Cherre Robins, MD  Procedure(s): Creation of right brachiocephalic AV fistula  x Do not stick fistula for 12 weeks    If you have any questions, please call the office at [phone].

## 2021-04-04 NOTE — Progress Notes (Signed)
Pt to d/c to snf today. Pt accepted at [facility] on MWF and will start Monday. Contacted nephrologist this am to advise him that pt is for d/c to snf today. Inquired if pt will require HD today prior to dc since snf cannot transport to clinic today or tomorrow. MD feels pt able to d/c today without HD and start at out-pt clinic on Monday. Contacted renal PA regarding clinic's need for orders. Contacted Melissa at [facility] to make clinic aware pt will start on Monday.   Melven Sartorius Renal Navigator [phone]

## 2021-04-04 NOTE — Progress Notes (Signed)
DISCHARGE NOTE SNF [facility] to be discharged Skilled nursing facility, [facility], per MD order. Patient verbalized understanding.  Skin clean, dry and intact without evidence of skin break down, no evidence of skin tears noted. IV catheter discontinued intact. Site without signs and symptoms of complications. Dressing and pressure applied. Pt denies pain at the site currently. No complaints noted.  Patient free of new lines, drains, and wounds. Discharged with Foley Catheter.  Discharge packet assembled. An After Visit Summary (AVS) was printed and given to the EMS personnel. Patient escorted via stretcher and discharged to [facility] via ambulance. Report called to accepting facility; all questions and concerns addressed.   Berneta Levins, RN

## 2021-04-04 NOTE — Progress Notes (Signed)
Called [name] to give reports  spoke to [facility].

## 2021-04-05 DIAGNOSIS — J449 Chronic obstructive pulmonary disease, unspecified: Secondary | ICD-10-CM | POA: Diagnosis not present

## 2021-04-05 DIAGNOSIS — I639 Cerebral infarction, unspecified: Secondary | ICD-10-CM | POA: Diagnosis not present

## 2021-04-05 DIAGNOSIS — N186 End stage renal disease: Secondary | ICD-10-CM | POA: Diagnosis not present

## 2021-04-05 DIAGNOSIS — E039 Hypothyroidism, unspecified: Secondary | ICD-10-CM | POA: Diagnosis not present

## 2021-04-05 DIAGNOSIS — I120 Hypertensive chronic kidney disease with stage 5 chronic kidney disease or end stage renal disease: Secondary | ICD-10-CM | POA: Diagnosis not present

## 2021-04-05 DIAGNOSIS — I2510 Atherosclerotic heart disease of native coronary artery without angina pectoris: Secondary | ICD-10-CM | POA: Diagnosis not present

## 2021-04-05 DIAGNOSIS — D638 Anemia in other chronic diseases classified elsewhere: Secondary | ICD-10-CM | POA: Diagnosis not present

## 2021-04-05 DIAGNOSIS — E782 Mixed hyperlipidemia: Secondary | ICD-10-CM | POA: Diagnosis not present

## 2021-04-05 DIAGNOSIS — M6281 Muscle weakness (generalized): Secondary | ICD-10-CM | POA: Diagnosis not present

## 2021-04-05 DIAGNOSIS — R600 Localized edema: Secondary | ICD-10-CM | POA: Diagnosis not present

## 2021-04-06 ENCOUNTER — Emergency Department (HOSPITAL_COMMUNITY): Payer: No Typology Code available for payment source

## 2021-04-06 ENCOUNTER — Inpatient Hospital Stay (HOSPITAL_COMMUNITY)
Admission: EM | Admit: 2021-04-06 | Discharge: 2021-04-11 | DRG: 377 | Disposition: A | Discharge status: Skilled Nursing Facility | Payer: No Typology Code available for payment source | Source: Skilled Nursing Facility | Attending: Internal Medicine | Admitting: Internal Medicine

## 2021-04-06 ENCOUNTER — Other Ambulatory Visit: Payer: Self-pay

## 2021-04-06 ENCOUNTER — Encounter (HOSPITAL_COMMUNITY): Payer: Self-pay | Admitting: Emergency Medicine

## 2021-04-06 DIAGNOSIS — M1990 Unspecified osteoarthritis, unspecified site: Secondary | ICD-10-CM | POA: Diagnosis present

## 2021-04-06 DIAGNOSIS — Z86010 Personal history of colonic polyps: Secondary | ICD-10-CM

## 2021-04-06 DIAGNOSIS — J90 Pleural effusion, not elsewhere classified: Secondary | ICD-10-CM | POA: Diagnosis present

## 2021-04-06 DIAGNOSIS — K269 Duodenal ulcer, unspecified as acute or chronic, without hemorrhage or perforation: Secondary | ICD-10-CM | POA: Diagnosis not present

## 2021-04-06 DIAGNOSIS — Z936 Other artificial openings of urinary tract status: Secondary | ICD-10-CM

## 2021-04-06 DIAGNOSIS — I480 Paroxysmal atrial fibrillation: Secondary | ICD-10-CM | POA: Diagnosis not present

## 2021-04-06 DIAGNOSIS — I4891 Unspecified atrial fibrillation: Secondary | ICD-10-CM | POA: Diagnosis not present

## 2021-04-06 DIAGNOSIS — I1311 Hypertensive heart and chronic kidney disease without heart failure, with stage 5 chronic kidney disease, or end stage renal disease: Secondary | ICD-10-CM | POA: Diagnosis present

## 2021-04-06 DIAGNOSIS — K2270 Barrett's esophagus without dysplasia: Secondary | ICD-10-CM | POA: Diagnosis present

## 2021-04-06 DIAGNOSIS — E872 Acidosis, unspecified: Secondary | ICD-10-CM | POA: Diagnosis present

## 2021-04-06 DIAGNOSIS — R600 Localized edema: Secondary | ICD-10-CM | POA: Diagnosis present

## 2021-04-06 DIAGNOSIS — I10 Essential (primary) hypertension: Secondary | ICD-10-CM | POA: Diagnosis not present

## 2021-04-06 DIAGNOSIS — Z8719 Personal history of other diseases of the digestive system: Secondary | ICD-10-CM

## 2021-04-06 DIAGNOSIS — I252 Old myocardial infarction: Secondary | ICD-10-CM

## 2021-04-06 DIAGNOSIS — K274 Chronic or unspecified peptic ulcer, site unspecified, with hemorrhage: Secondary | ICD-10-CM | POA: Diagnosis not present

## 2021-04-06 DIAGNOSIS — R262 Difficulty in walking, not elsewhere classified: Secondary | ICD-10-CM | POA: Diagnosis not present

## 2021-04-06 DIAGNOSIS — J449 Chronic obstructive pulmonary disease, unspecified: Secondary | ICD-10-CM | POA: Diagnosis present

## 2021-04-06 DIAGNOSIS — I959 Hypotension, unspecified: Secondary | ICD-10-CM | POA: Diagnosis not present

## 2021-04-06 DIAGNOSIS — R791 Abnormal coagulation profile: Secondary | ICD-10-CM | POA: Diagnosis not present

## 2021-04-06 DIAGNOSIS — Z85528 Personal history of other malignant neoplasm of kidney: Secondary | ICD-10-CM

## 2021-04-06 DIAGNOSIS — Z20822 Contact with and (suspected) exposure to covid-19: Secondary | ICD-10-CM | POA: Diagnosis present

## 2021-04-06 DIAGNOSIS — Z992 Dependence on renal dialysis: Secondary | ICD-10-CM | POA: Diagnosis not present

## 2021-04-06 DIAGNOSIS — J439 Emphysema, unspecified: Secondary | ICD-10-CM | POA: Diagnosis not present

## 2021-04-06 DIAGNOSIS — K922 Gastrointestinal hemorrhage, unspecified: Secondary | ICD-10-CM | POA: Diagnosis present

## 2021-04-06 DIAGNOSIS — K264 Chronic or unspecified duodenal ulcer with hemorrhage: Secondary | ICD-10-CM | POA: Diagnosis present

## 2021-04-06 DIAGNOSIS — K319 Disease of stomach and duodenum, unspecified: Secondary | ICD-10-CM | POA: Diagnosis not present

## 2021-04-06 DIAGNOSIS — J189 Pneumonia, unspecified organism: Secondary | ICD-10-CM | POA: Diagnosis present

## 2021-04-06 DIAGNOSIS — Z887 Allergy status to serum and vaccine status: Secondary | ICD-10-CM

## 2021-04-06 DIAGNOSIS — K920 Hematemesis: Secondary | ICD-10-CM | POA: Diagnosis not present

## 2021-04-06 DIAGNOSIS — N179 Acute kidney failure, unspecified: Secondary | ICD-10-CM | POA: Diagnosis present

## 2021-04-06 DIAGNOSIS — K2289 Other specified disease of esophagus: Secondary | ICD-10-CM | POA: Diagnosis not present

## 2021-04-06 DIAGNOSIS — R269 Unspecified abnormalities of gait and mobility: Secondary | ICD-10-CM | POA: Diagnosis present

## 2021-04-06 DIAGNOSIS — Z955 Presence of coronary angioplasty implant and graft: Secondary | ICD-10-CM

## 2021-04-06 DIAGNOSIS — Z79899 Other long term (current) drug therapy: Secondary | ICD-10-CM

## 2021-04-06 DIAGNOSIS — R1312 Dysphagia, oropharyngeal phase: Secondary | ICD-10-CM | POA: Diagnosis not present

## 2021-04-06 DIAGNOSIS — Z91030 Bee allergy status: Secondary | ICD-10-CM

## 2021-04-06 DIAGNOSIS — L03116 Cellulitis of left lower limb: Secondary | ICD-10-CM | POA: Diagnosis not present

## 2021-04-06 DIAGNOSIS — L03115 Cellulitis of right lower limb: Secondary | ICD-10-CM | POA: Diagnosis not present

## 2021-04-06 DIAGNOSIS — Z8673 Personal history of transient ischemic attack (TIA), and cerebral infarction without residual deficits: Secondary | ICD-10-CM

## 2021-04-06 DIAGNOSIS — R58 Hemorrhage, not elsewhere classified: Secondary | ICD-10-CM | POA: Diagnosis not present

## 2021-04-06 DIAGNOSIS — N2581 Secondary hyperparathyroidism of renal origin: Secondary | ICD-10-CM | POA: Diagnosis present

## 2021-04-06 DIAGNOSIS — Z9049 Acquired absence of other specified parts of digestive tract: Secondary | ICD-10-CM

## 2021-04-06 DIAGNOSIS — K861 Other chronic pancreatitis: Secondary | ICD-10-CM | POA: Diagnosis present

## 2021-04-06 DIAGNOSIS — D631 Anemia in chronic kidney disease: Secondary | ICD-10-CM | POA: Diagnosis present

## 2021-04-06 DIAGNOSIS — I739 Peripheral vascular disease, unspecified: Secondary | ICD-10-CM | POA: Diagnosis not present

## 2021-04-06 DIAGNOSIS — Z87891 Personal history of nicotine dependence: Secondary | ICD-10-CM

## 2021-04-06 DIAGNOSIS — D62 Acute posthemorrhagic anemia: Secondary | ICD-10-CM | POA: Diagnosis present

## 2021-04-06 DIAGNOSIS — K259 Gastric ulcer, unspecified as acute or chronic, without hemorrhage or perforation: Secondary | ICD-10-CM

## 2021-04-06 DIAGNOSIS — L89321 Pressure ulcer of left buttock, stage 1: Secondary | ICD-10-CM | POA: Diagnosis present

## 2021-04-06 DIAGNOSIS — Z7401 Bed confinement status: Secondary | ICD-10-CM | POA: Diagnosis not present

## 2021-04-06 DIAGNOSIS — E039 Hypothyroidism, unspecified: Secondary | ICD-10-CM | POA: Diagnosis not present

## 2021-04-06 DIAGNOSIS — R1084 Generalized abdominal pain: Secondary | ICD-10-CM

## 2021-04-06 DIAGNOSIS — D689 Coagulation defect, unspecified: Secondary | ICD-10-CM | POA: Diagnosis present

## 2021-04-06 DIAGNOSIS — K769 Liver disease, unspecified: Secondary | ICD-10-CM | POA: Diagnosis present

## 2021-04-06 DIAGNOSIS — I679 Cerebrovascular disease, unspecified: Secondary | ICD-10-CM | POA: Diagnosis not present

## 2021-04-06 DIAGNOSIS — K253 Acute gastric ulcer without hemorrhage or perforation: Secondary | ICD-10-CM | POA: Diagnosis not present

## 2021-04-06 DIAGNOSIS — I872 Venous insufficiency (chronic) (peripheral): Secondary | ICD-10-CM | POA: Diagnosis present

## 2021-04-06 DIAGNOSIS — K254 Chronic or unspecified gastric ulcer with hemorrhage: Secondary | ICD-10-CM | POA: Diagnosis present

## 2021-04-06 DIAGNOSIS — M6281 Muscle weakness (generalized): Secondary | ICD-10-CM | POA: Diagnosis not present

## 2021-04-06 DIAGNOSIS — R339 Retention of urine, unspecified: Secondary | ICD-10-CM | POA: Diagnosis present

## 2021-04-06 DIAGNOSIS — R197 Diarrhea, unspecified: Secondary | ICD-10-CM | POA: Diagnosis not present

## 2021-04-06 DIAGNOSIS — K2980 Duodenitis without bleeding: Secondary | ICD-10-CM | POA: Diagnosis present

## 2021-04-06 DIAGNOSIS — N139 Obstructive and reflux uropathy, unspecified: Secondary | ICD-10-CM | POA: Diagnosis not present

## 2021-04-06 DIAGNOSIS — R601 Generalized edema: Secondary | ICD-10-CM | POA: Diagnosis present

## 2021-04-06 DIAGNOSIS — R5381 Other malaise: Secondary | ICD-10-CM | POA: Diagnosis present

## 2021-04-06 DIAGNOSIS — N186 End stage renal disease: Secondary | ICD-10-CM | POA: Diagnosis present

## 2021-04-06 DIAGNOSIS — J441 Chronic obstructive pulmonary disease with (acute) exacerbation: Secondary | ICD-10-CM | POA: Diagnosis present

## 2021-04-06 DIAGNOSIS — E1122 Type 2 diabetes mellitus with diabetic chronic kidney disease: Secondary | ICD-10-CM | POA: Diagnosis present

## 2021-04-06 DIAGNOSIS — J9811 Atelectasis: Secondary | ICD-10-CM | POA: Diagnosis present

## 2021-04-06 DIAGNOSIS — R110 Nausea: Secondary | ICD-10-CM | POA: Diagnosis not present

## 2021-04-06 DIAGNOSIS — Z8249 Family history of ischemic heart disease and other diseases of the circulatory system: Secondary | ICD-10-CM

## 2021-04-06 DIAGNOSIS — T8130XA Disruption of wound, unspecified, initial encounter: Secondary | ICD-10-CM | POA: Diagnosis not present

## 2021-04-06 DIAGNOSIS — I2510 Atherosclerotic heart disease of native coronary artery without angina pectoris: Secondary | ICD-10-CM | POA: Diagnosis present

## 2021-04-06 DIAGNOSIS — R1111 Vomiting without nausea: Secondary | ICD-10-CM | POA: Diagnosis not present

## 2021-04-06 DIAGNOSIS — M79601 Pain in right arm: Secondary | ICD-10-CM | POA: Diagnosis present

## 2021-04-06 DIAGNOSIS — K2981 Duodenitis with bleeding: Secondary | ICD-10-CM | POA: Diagnosis not present

## 2021-04-06 DIAGNOSIS — I6529 Occlusion and stenosis of unspecified carotid artery: Secondary | ICD-10-CM | POA: Diagnosis present

## 2021-04-06 HISTORY — DX: Pneumonia, unspecified organism: J18.9

## 2021-04-06 LAB — COMPREHENSIVE METABOLIC PANEL
ALT: 5 U/L (ref 0–44)
AST: 12 U/L — ABNORMAL LOW (ref 15–41)
Albumin: 1.8 g/dL — ABNORMAL LOW (ref 3.5–5.0)
Alkaline Phosphatase: 46 U/L (ref 38–126)
Anion gap: 16 — ABNORMAL HIGH (ref 5–15)
BUN: 60 mg/dL — ABNORMAL HIGH (ref 8–23)
CO2: 20 mmol/L — ABNORMAL LOW (ref 22–32)
Calcium: 7.6 mg/dL — ABNORMAL LOW (ref 8.9–10.3)
Chloride: 100 mmol/L (ref 98–111)
Creatinine, Ser: 5.65 mg/dL — ABNORMAL HIGH (ref 0.61–1.24)
GFR, Estimated: 9 mL/min — ABNORMAL LOW (ref >60)
Glucose, Bld: 145 mg/dL — ABNORMAL HIGH (ref 70–99)
Potassium: 4.5 mmol/L (ref 3.5–5.1)
Sodium: 136 mmol/L (ref 135–145)
Total Bilirubin: 0.3 mg/dL (ref 0.3–1.2)
Total Protein: 5 g/dL — ABNORMAL LOW (ref 6.5–8.1)

## 2021-04-06 LAB — CBC
HCT: 20.1 % — ABNORMAL LOW (ref 39.0–52.0)
Hemoglobin: 6.6 g/dL — CL (ref 13.0–17.0)
MCH: 28.4 pg (ref 26.0–34.0)
MCHC: 32.8 g/dL (ref 30.0–36.0)
MCV: 86.6 fL (ref 80.0–100.0)
Platelets: 289 10*3/uL (ref 150–400)
RBC: 2.32 MIL/uL — ABNORMAL LOW (ref 4.22–5.81)
RDW: 17.6 % — ABNORMAL HIGH (ref 11.5–15.5)
WBC: 13.1 10*3/uL — ABNORMAL HIGH (ref 4.0–10.5)
nRBC: 0 % (ref 0.0–0.2)

## 2021-04-06 LAB — LIPASE, BLOOD: Lipase: 65 U/L — ABNORMAL HIGH (ref 11–51)

## 2021-04-06 LAB — CBC WITH DIFFERENTIAL/PLATELET
Abs Immature Granulocytes: 0.11 10*3/uL — ABNORMAL HIGH (ref 0.00–0.07)
Basophils Absolute: 0.1 10*3/uL (ref 0.0–0.1)
Basophils Relative: 0 %
Eosinophils Absolute: 0 10*3/uL (ref 0.0–0.5)
Eosinophils Relative: 0 %
HCT: 25.9 % — ABNORMAL LOW (ref 39.0–52.0)
Hemoglobin: 8.4 g/dL — ABNORMAL LOW (ref 13.0–17.0)
Immature Granulocytes: 1 %
Lymphocytes Relative: 7 %
Lymphs Abs: 0.8 10*3/uL (ref 0.7–4.0)
MCH: 30.1 pg (ref 26.0–34.0)
MCHC: 32.4 g/dL (ref 30.0–36.0)
MCV: 92.8 fL (ref 80.0–100.0)
Monocytes Absolute: 0.5 10*3/uL (ref 0.1–1.0)
Monocytes Relative: 4 %
Neutro Abs: 10.9 10*3/uL — ABNORMAL HIGH (ref 1.7–7.7)
Neutrophils Relative %: 88 %
Platelets: 417 10*3/uL — ABNORMAL HIGH (ref 150–400)
RBC: 2.79 MIL/uL — ABNORMAL LOW (ref 4.22–5.81)
RDW: 14.5 % (ref 11.5–15.5)
WBC: 12.4 10*3/uL — ABNORMAL HIGH (ref 4.0–10.5)
nRBC: 0 % (ref 0.0–0.2)

## 2021-04-06 LAB — RESP PANEL BY RT-PCR (FLU A&B, COVID) ARPGX2
Influenza A by PCR: NEGATIVE
Influenza B by PCR: NEGATIVE
SARS Coronavirus 2 by RT PCR: NEGATIVE

## 2021-04-06 LAB — PREPARE RBC (CROSSMATCH)

## 2021-04-06 LAB — PROCALCITONIN: Procalcitonin: 0.29 ng/mL

## 2021-04-06 LAB — PROTIME-INR
INR: 4 — ABNORMAL HIGH (ref 0.8–1.2)
Prothrombin Time: 38.9 seconds — ABNORMAL HIGH (ref 11.4–15.2)

## 2021-04-06 LAB — POC OCCULT BLOOD, ED: Fecal Occult Bld: POSITIVE — AB

## 2021-04-06 LAB — HEMOGLOBIN AND HEMATOCRIT, BLOOD
HCT: 22.8 % — ABNORMAL LOW (ref 39.0–52.0)
Hemoglobin: 6.8 g/dL — CL (ref 13.0–17.0)

## 2021-04-06 MED ORDER — HYDRALAZINE HCL 10 MG PO TABS: 10.0000 mg | ORAL_TABLET | Freq: Three times a day (TID) | ORAL | Status: DC | PRN

## 2021-04-06 MED ORDER — PANTOPRAZOLE INFUSION (NEW) - SIMPLE MED
8.0000 mg/h | INTRAVENOUS | Status: DC
  Administered 2021-04-06 – 2021-04-08 (×3): 8 mg/h via INTRAVENOUS
  Filled 2021-04-06 (×2): qty 80
  Filled 2021-04-06 (×2): qty 100

## 2021-04-06 MED ORDER — SODIUM CHLORIDE 0.9 % IV SOLN
1.0000 g | Freq: Once | INTRAVENOUS | Status: AC
  Administered 2021-04-06: 1 g via INTRAVENOUS
  Filled 2021-04-06: qty 10

## 2021-04-06 MED ORDER — SODIUM CHLORIDE 0.9% IV SOLUTION: Freq: Once | INTRAVENOUS | Status: DC

## 2021-04-06 MED ORDER — LACTATED RINGERS IV SOLN: INTRAVENOUS | Status: DC

## 2021-04-06 MED ORDER — OCTREOTIDE LOAD VIA INFUSION
50.0000 ug | Freq: Once | INTRAVENOUS | Status: AC
  Administered 2021-04-06: 50 ug via INTRAVENOUS
  Filled 2021-04-06: qty 25

## 2021-04-06 MED ORDER — VITAMIN K1 10 MG/ML IJ SOLN
5.0000 mg | Freq: Once | INTRAVENOUS | Status: AC
  Administered 2021-04-06: 5 mg via INTRAVENOUS
  Filled 2021-04-06: qty 0.5

## 2021-04-06 MED ORDER — ONDANSETRON HCL 4 MG/2ML IJ SOLN
4.0000 mg | Freq: Once | INTRAMUSCULAR | Status: AC
  Administered 2021-04-06: 4 mg via INTRAVENOUS
  Filled 2021-04-06: qty 2

## 2021-04-06 MED ORDER — FUROSEMIDE 10 MG/ML IJ SOLN
20.0000 mg | Freq: Once | INTRAMUSCULAR | Status: AC
Start: 2021-04-06 — End: 2021-04-06
  Administered 2021-04-06: 20 mg via INTRAVENOUS
  Filled 2021-04-06: qty 2

## 2021-04-06 MED ORDER — ACETAMINOPHEN 650 MG RE SUPP: 650.0000 mg | Freq: Four times a day (QID) | RECTAL | Status: DC | PRN

## 2021-04-06 MED ORDER — PANTOPRAZOLE SODIUM 40 MG IV SOLR
40.0000 mg | Freq: Once | INTRAVENOUS | Status: AC
  Administered 2021-04-06: 40 mg via INTRAVENOUS
  Filled 2021-04-06: qty 10

## 2021-04-06 MED ORDER — SODIUM CHLORIDE 0.9 % IV SOLN
12.5000 mg | Freq: Once | INTRAVENOUS | Status: AC
  Administered 2021-04-06: 12.5 mg via INTRAVENOUS
  Filled 2021-04-06: qty 0.5

## 2021-04-06 MED ORDER — SODIUM CHLORIDE 0.9 % IV SOLN
50.0000 ug/h | INTRAVENOUS | Status: DC
  Administered 2021-04-06 – 2021-04-08 (×6): 50 ug/h via INTRAVENOUS
  Filled 2021-04-06 (×6): qty 1

## 2021-04-06 MED ORDER — HYDROMORPHONE HCL 1 MG/ML IJ SOLN
1.0000 mg | Freq: Once | INTRAMUSCULAR | Status: AC
  Administered 2021-04-06: 1 mg via INTRAVENOUS
  Filled 2021-04-06: qty 1

## 2021-04-06 MED ORDER — SODIUM CHLORIDE 0.9 % IV SOLN
2.0000 g | INTRAVENOUS | Status: DC
  Administered 2021-04-06 – 2021-04-07 (×2): 2 g via INTRAVENOUS
  Filled 2021-04-06 (×2): qty 20

## 2021-04-06 MED ORDER — ACETAMINOPHEN 325 MG PO TABS: 650.0000 mg | ORAL_TABLET | Freq: Four times a day (QID) | ORAL | Status: DC | PRN

## 2021-04-06 MED ORDER — PRAVASTATIN SODIUM 40 MG PO TABS
40.0000 mg | ORAL_TABLET | Freq: Every day | ORAL | Status: DC
Start: 2021-04-06 — End: 2021-04-12
  Administered 2021-04-07 – 2021-04-10 (×4): 40 mg via ORAL
  Filled 2021-04-06 (×4): qty 1

## 2021-04-06 MED ORDER — IPRATROPIUM-ALBUTEROL 0.5-2.5 (3) MG/3ML IN SOLN
3.0000 mL | Freq: Four times a day (QID) | RESPIRATORY_TRACT | Status: DC | PRN
  Administered 2021-04-08 – 2021-04-10 (×4): 3 mL via RESPIRATORY_TRACT
  Filled 2021-04-06 (×4): qty 3

## 2021-04-06 MED ORDER — MORPHINE SULFATE (PF) 4 MG/ML IV SOLN
4.0000 mg | Freq: Once | INTRAVENOUS | Status: AC
  Administered 2021-04-06: 4 mg via INTRAVENOUS
  Filled 2021-04-06: qty 1

## 2021-04-06 MED ORDER — CHLORHEXIDINE GLUCONATE CLOTH 2 % EX PADS
6.0000 | MEDICATED_PAD | Freq: Every day | CUTANEOUS | Status: DC
  Administered 2021-04-07 – 2021-04-11 (×4): 6 via TOPICAL

## 2021-04-06 MED ORDER — SODIUM CHLORIDE 0.9 % IV SOLN
500.0000 mg | INTRAVENOUS | Status: DC
  Administered 2021-04-06 – 2021-04-07 (×2): 500 mg via INTRAVENOUS
  Filled 2021-04-06 (×3): qty 5

## 2021-04-06 MED ORDER — PROCHLORPERAZINE EDISYLATE 10 MG/2ML IJ SOLN
10.0000 mg | Freq: Four times a day (QID) | INTRAMUSCULAR | Status: AC | PRN
  Administered 2021-04-10 – 2021-04-11 (×3): 10 mg via INTRAVENOUS
  Filled 2021-04-06 (×3): qty 2

## 2021-04-06 MED ORDER — HYDROMORPHONE HCL 1 MG/ML IJ SOLN
1.0000 mg | INTRAMUSCULAR | Status: DC | PRN
  Administered 2021-04-06: 1 mg via INTRAVENOUS
  Filled 2021-04-06: qty 1

## 2021-04-06 MED ORDER — SODIUM CHLORIDE 0.9 % IV SOLN
500.0000 mg | Freq: Once | INTRAVENOUS | Status: AC
  Administered 2021-04-06: 500 mg via INTRAVENOUS
  Filled 2021-04-06: qty 5

## 2021-04-06 NOTE — ED Provider Notes (Signed)
[facility] EMERGENCY DEPARTMENT Provider Note   CSN: [id number] Arrival date & time: 04/06/21  0149     History  Chief Complaint  Patient presents with   Rectal Bleeding    Wayne Meadows is a 87 y.o. male.  This is a 87 y.o. male  with significant medical history as below, including ESRD on HD T/TH/Sa (last Th), COPD, diverticulitis, renal carcinoma who presents to the ED with complaint of abd pain, n/v, diarrhea, dark stool.  Patient ports emesis x2 weeks, worse in the past 24 hours.  Reduced oral intake.  Minimal appetite.  Ongoing generalized abdominal pain, cramping, sharp, stabbing.  Nausea.  Last emesis just prior to arrival.  Diarrhea the past 24 hours, dark.  No thinners.  Indwelling Foley catheter, urine is much darker than typical.  No fevers, chills, chest pain.  Does have mild dyspnea.  No trauma.  No recent med changes.     Past Medical History: No date: Arthritis No date: Carotid stenosis No date: COPD (chronic obstructive pulmonary disease) (HCC) No date: Coronary artery disease     Comment:  S/p PCI 2011;  NSTEMI 12/12:  LHC/PCI 02/23/11: LAD 60%               after the septal perforator, D1 occluded with distal               collaterals, proximal RI 30-40%, AV circumflex stent               patent with 60% stenosis after the stent, RCA 99%, EF               60-65%.  His RCA was treated with a bare-metal stent 2011: CVA (cerebral infarction)     Comment:  Right cerebral; total obstruction of the right ICA No date: Diverticulitis No date: Hypertension 07/2015: Rectal bleeding No date: Renal carcinoma (HCC) No date: Stroke [facility]) No date: Tobacco abuse, in remission  Past Surgical History: 04/03/2021: AV FISTULA PLACEMENT; Right     Comment:  Procedure: ARTERIOVENOUS (AV) CREATION OF RIGHT ARM               BRACHIOCEPHALIC FISTULA;  Surgeon: Cherre Robins, MD;               Location: [facility] OR;  Service: Vascular;  Laterality: Right; No  date: COLON SURGERY 08/22/2015: COLONOSCOPY; N/A     Comment:  Procedure: COLONOSCOPY;  Surgeon: [name],               MD;  Location: [facility] ENDOSCOPY;  Service: Endoscopy;                Laterality: N/A; 07/18/2020: ESOPHAGOGASTRODUODENOSCOPY; N/A     Comment:  Procedure: ESOPHAGOGASTRODUODENOSCOPY (EGD);  Surgeon:               Milus Banister, MD;  Location: [facility];  Service:               Endoscopy;  Laterality: N/A; 06/21/2020: ESOPHAGOGASTRODUODENOSCOPY (EGD) WITH PROPOFOL; N/A     Comment:  Procedure: ESOPHAGOGASTRODUODENOSCOPY (EGD) WITH               PROPOFOL;  Surgeon: Irene Shipper, MD;  Location: [facility]               ENDOSCOPY;  Service: Endoscopy;  Laterality: N/A; 07/18/2020: EUS; N/A     Comment:  Procedure: UPPER ENDOSCOPIC ULTRASOUND (EUS) RADIAL;  Surgeon: Milus Banister, MD;  Location: [facility] ENDOS[facility];                Service: Endoscopy;  Laterality: N/A; 07/18/2020: FINE NEEDLE ASPIRATION; N/A     Comment:  Procedure: FINE NEEDLE ASPIRATION (FNA) LINEAR;                Surgeon: Milus Banister, MD;  Location: [facility] ENDOSCOPY;                Service: Endoscopy;  Laterality: N/A; No date: hip relacement 03/31/2021: IR FLUORO GUIDE CV LINE RIGHT 03/31/2021: IR US GUIDE VASC ACCESS RIGHT No date: KIDNEY SURGERY 07/09/2020: LEFT HEART CATH AND CORONARY ANGIOGRAPHY; N/A     Comment:  Procedure: LEFT HEART CATH AND CORONARY ANGIOGRAPHY;                Surgeon: Leonie Man, MD;  Location: [place] [facility]               LAB;  Service: Cardiovascular;  Laterality: N/A; 02/23/2011: LEFT HEART CATHETERIZATION WITH CORONARY ANGIOGRAM; N/A     Comment:  Procedure: LEFT HEART CATHETERIZATION WITH CORONARY               ANGIOGRAM;  Surgeon: [name], MD;  Location: [facility]               CATH LAB;  Service: Cardiovascular;  Laterality: N/A; 03/18/2011: LEFT HEART CATHETERIZATION WITH CORONARY ANGIOGRAM; N/A     Comment:  Procedure: LEFT HEART CATHETERIZATION WITH  CORONARY               ANGIOGRAM;  Surgeon: [name], MD;  Location: [facility]               CATH LAB;  Service: Cardiovascular;  Laterality: N/A; 02/23/2011: PERCUTANEOUS CORONARY STENT INTERVENTION (PCI-S); N/A     Comment:  Procedure: PERCUTANEOUS CORONARY STENT INTERVENTION               (PCI-S);  Surgeon: [name], MD;  Location: [facility] CATH              LAB;  Service: Cardiovascular;  Laterality: N/A; 02/23/2011: TEMPORARY PACEMAKER INSERTION; N/A     Comment:  Procedure: TEMPORARY PACEMAKER INSERTION;  Surgeon:               [name], MD;  Location: [facility] CATH LAB;  Service:               Cardiovascular;  Laterality: N/A; 04/03/2021: THROMBECTOMY W/ EMBOLECTOMY; Right     Comment:  Procedure: THROMBECTOMY ARTERIOVENOUS FISTULA;  Surgeon:              Cherre Robins, MD;  Location: [place];  Service:               Vascular;  Laterality: Right;    The history is provided by the patient. No language interpreter was used.  Rectal Bleeding Associated symptoms: vomiting   Associated symptoms: no abdominal pain and no fever       Home Medications Prior to Admission medications   Medication Sig Start Date End Date Taking? Authorizing Provider  albuterol (VENTOLIN HFA) 108 (90 Base) MCG/ACT inhaler Inhale 1 puff into the lungs every 6 (six) hours as needed for shortness of breath.   Yes [provider]  hydrALAZINE (APRESOLINE) 10 MG tablet Take 1 tablet (10 mg total) by mouth every 8 (eight) hours as needed (for SBP  greater than 180). 04/04/21  Yes Little Ishikawa, MD  Ipratropium-Albuterol (COMBIVENT) 20-100 MCG/ACT AERS respimat Inhale 1 puff into the lungs 4 (four) times daily.   Yes [provider]  lanthanum (FOSRENOL) 1000 MG chewable tablet Chew 1 tablet (1,000 mg total) by mouth 3 (three) times daily with meals. 04/04/21  Yes Little Ishikawa, MD  pravastatin (PRAVACHOL) 40 MG tablet Take 40 mg by mouth at bedtime.   Yes [provider]   amLODipine (NORVASC) 10 MG tablet Take 1 tablet (10 mg total) by mouth daily. Patient not taking: Reported on 04/06/2021 07/11/20   Margie Billet, NP  aspirin EC 81 MG tablet Take 81 mg by mouth daily. Patient not taking: Reported on 04/06/2021    [provider]  cloNIDine (CATAPRES) 0.1 MG tablet Take 1 tablet (0.1 mg total) by mouth daily. Patient not taking: Reported on 04/06/2021 06/24/20   Harvie Heck, MD  ferrous sulfate 325 (65 FE) MG tablet Take 1 tablet by mouth. Every Monday, Wednesday, and Friday Patient not taking: Reported on 04/06/2021 07/30/20   [provider]  HYDROcodone-acetaminophen (NORCO) 10-325 MG tablet Take 1 tablet by mouth every 4 (four) hours as needed for up to 5 days for severe pain. Patient not taking: Reported on 04/06/2021 04/04/21 04/09/21  Little Ishikawa, MD  levothyroxine (SYNTHROID) 50 MCG tablet Take 50 mcg by mouth daily before breakfast. Patient not taking: Reported on 04/06/2021    [provider]  metoprolol tartrate (LOPRESSOR) 25 MG tablet Take 25 mg by mouth daily. Patient not taking: Reported on 04/06/2021    [provider]  Multiple Vitamin (MULTIVITAMIN) tablet Take 1 tablet by mouth daily. Patient not taking: Reported on 04/06/2021    [provider]  polyethylene glycol (MIRALAX / GLYCOLAX) 17 g packet Take 17 g by mouth daily as needed for mild constipation or moderate constipation. Patient not taking: Reported on 04/06/2021 08/15/20   [name], MD      Allergies    Bee venom and Influenza vaccines    Review of Systems   Review of Systems  Constitutional:  Positive for appetite change and fatigue. Negative for chills and fever.  HENT:  Negative for facial swelling and trouble swallowing.   Eyes:  Negative for photophobia and visual disturbance.  Respiratory:  Positive for shortness of breath. Negative for cough.   Cardiovascular:  Negative for chest pain and palpitations.  Gastrointestinal:   Positive for diarrhea, hematochezia, nausea and vomiting. Negative for abdominal pain and blood in stool.       Dark stool, no frank bleeding   Endocrine: Negative for polydipsia and polyuria.  Genitourinary:  Negative for difficulty urinating and hematuria.  Musculoskeletal:  Negative for gait problem and joint swelling.  Skin:  Negative for pallor and rash.  Neurological:  Negative for syncope and headaches.  Psychiatric/Behavioral:  Negative for agitation and confusion.    Physical Exam Updated Vital Signs BP (!) 135/53    Pulse 87    Temp 97.6 F (36.4 C)    Resp 20    Ht 6\' 2"  (1.88 m)    Wt 89.7 kg    SpO2 94%    BMI 25.39 kg/m  Physical Exam Vitals and nursing note reviewed.  Constitutional:      General: He is not in acute distress.    Appearance: He is well-developed. He is ill-appearing.     Comments: pale  HENT:     Head: Normocephalic and atraumatic.  Right Ear: External ear normal.     Left Ear: External ear normal.     Mouth/Throat:     Mouth: Mucous membranes are moist.  Eyes:     General: Gaze aligned appropriately. No scleral icterus.    Comments: Conjunctival pallor  Cardiovascular:     Rate and Rhythm: Normal rate and regular rhythm.     Pulses: Normal pulses.     Heart sounds: Normal heart sounds.  Pulmonary:     Effort: Pulmonary effort is normal. No respiratory distress.     Breath sounds: Normal breath sounds.  Abdominal:     General: Abdomen is flat.     Palpations: Abdomen is soft.     Tenderness: There is generalized abdominal tenderness. There is no guarding or rebound.    Genitourinary:    Comments: Foley, dark urine Musculoskeletal:        General: Normal range of motion.     Cervical back: Normal range of motion.     Right lower leg: No edema.     Left lower leg: No edema.  Skin:    General: Skin is warm and dry.     Capillary Refill: Capillary refill takes less than 2 seconds.  Neurological:     Mental Status: He is alert and  oriented to person, place, and time.  Psychiatric:        Mood and Affect: Mood normal.        Behavior: Behavior normal.    ED Results / Procedures / Treatments   Labs (all labs ordered are listed, but only abnormal results are displayed) Labs Reviewed  CBC WITH DIFFERENTIAL/PLATELET - Abnormal; Notable for the following components:      Result Value   WBC 12.4 (*)    RBC 2.79 (*)    Hemoglobin 8.4 (*)    HCT 25.9 (*)    Platelets 417 (*)    Neutro Abs 10.9 (*)    Abs Immature Granulocytes 0.11 (*)    All other components within normal limits  COMPREHENSIVE METABOLIC PANEL - Abnormal; Notable for the following components:   CO2 20 (*)    Glucose, Bld 145 (*)    BUN 60 (*)    Creatinine, Ser 5.65 (*)    Calcium 7.6 (*)    Total Protein 5.0 (*)    Albumin 1.8 (*)    AST 12 (*)    GFR, Estimated 9 (*)    Anion gap 16 (*)    All other components within normal limits  LIPASE, BLOOD - Abnormal; Notable for the following components:   Lipase 65 (*)    All other components within normal limits  PROTIME-INR - Abnormal; Notable for the following components:   Prothrombin Time 38.9 (*)    INR 4.0 (*)    All other components within normal limits  POC OCCULT BLOOD, ED - Abnormal; Notable for the following components:   Fecal Occult Bld POSITIVE (*)    All other components within normal limits  RESP PANEL BY RT-PCR (FLU A&B, COVID) [id number]  TYPE AND SCREEN    EKG EKG Interpretation  Date/Time:  Sunday April 06 2021 02:46:21 EST Ventricular Rate:  91 PR Interval:  146 QRS Duration: 93 QT Interval:  380 QTC Calculation: 468 R Axis:   66 Text Interpretation: Sinus rhythm Minimal ST depression, inferior leads When compared with ECG of 03/17/2021, No significant change was found Confirmed by Delora Fuel ([id number]) on 04/06/2021 4:10:41 AM  Radiology CT ABDOMEN PELVIS  WO CONTRAST  Result Date: 04/06/2021 CLINICAL DATA:  Abdominal pain. EXAM: CT ABDOMEN AND PELVIS WITHOUT  CONTRAST TECHNIQUE: Multidetector CT imaging of the abdomen and pelvis was performed following the standard protocol without IV contrast. RADIATION DOSE REDUCTION: This exam was performed according to the departmental dose-optimization program which includes automated exposure control, adjustment of the mA and/or kV according to patient size and/or use of iterative reconstruction technique. COMPARISON:  CT abdomen pelvis dated 08/12/2020. FINDINGS: Evaluation of this exam is limited in the absence of intravenous contrast. Evaluation is also limited due to streak artifact caused by patient's arms. Lower chest: Partially visualized small bilateral pleural effusions, left greater right with associated partial compressive atelectasis of the lower lobes versus pneumonia. Clinical correlation is recommended. Three vessel coronary vascular calcification and small pericardial effusion. Partially visualized central venous line with tip at the cavoatrial junction. No intra-abdominal free air or free fluid. Hepatobiliary: Ill-defined 2.5 cm hypodense lesion in the right lobe of the liver not characterized on this CT. No intrahepatic biliary dilatation. Cholecystectomy. Pancreas: Atrophic pancreas with scattered coarse calcification suggestive of chronic pancreatitis. No active inflammatory changes. Spleen: Normal in size without focal abnormality. Adrenals/Urinary Tract: The adrenal glands are suboptimally visualized. There is no hydronephrosis or nephrolithiasis on either side. Bilateral renal cysts measure up to 4.5 cm in the upper pole of the right kidney. Additional smaller hypodense lesions are not characterized on this CT. The visualized ureters appear unremarkable. The urinary bladder is decompressed around a Foley catheter. Stomach/Bowel: Mild stranding surrounding the duodenal C-loop may represent duodenum this. Clinical correlation is recommended. There is sigmoid diverticulosis and scattered colonic diverticula  without active inflammatory changes. No bowel obstruction. Vascular/Lymphatic: Advanced aortoiliac atherosclerotic disease. The IVC is unremarkable. No portal venous gas. There is no adenopathy. Reproductive: The prostate gland is poorly visualized. Other: Mild diffuse subcutaneous edema. Musculoskeletal: Osteopenia with degenerative changes of the spine. Lower lumbar posterior fusion hardware as well as right hip arthroplasty. No acute osseous pathology. IMPRESSION: 1. Mild stranding surrounding the duodenal C-loop may represent duodenum. No bowel obstruction. 2. Colonic diverticulosis. 3. No hydronephrosis or nephrolithiasis. 4. Partially visualized small bilateral pleural effusions, left greater right with associated partial compressive atelectasis of the lower lobes versus pneumonia. 5. Ill-defined 2.5 cm hypodense lesion in the right lobe of the liver not characterized on this CT. 6. Aortic Atherosclerosis (ICD10-I70.0). Electronically Signed   By: Anner Crete M.D.   On: 04/06/2021 03:16   DG Chest Portable 1 View  Result Date: 04/06/2021 CLINICAL DATA:  Difficulty breathing EXAM: PORTABLE CHEST 1 VIEW COMPARISON:  03/27/2021 FINDINGS: Hazy opacities in the bilateral lower lungs, reflecting combination of small to moderate bilateral pleural effusions (right greater than left) and suspected lower lobe compressive atelectasis. No frank interstitial edema. The heart is normal in size.  Mild thoracic aortic atherosclerosis. Right IJ dialysis catheter terminates in the upper right atrium. IMPRESSION: Small to moderate bilateral pleural effusions (right greater than left) with suspected lower lobe compressive atelectasis. Electronically Signed   By: Julian Hy M.D.   On: 04/06/2021 02:23    Procedures .Critical Care Performed by: Jeanell Sparrow, DO Authorized by: Jeanell Sparrow, DO   Critical care provider statement:    Critical care time (minutes):  32   Critical care time was exclusive of:   Separately billable procedures and treating other patients   Critical care was necessary to treat or prevent imminent or life-threatening deterioration of the following conditions:  Hepatic failure  Critical care was time spent personally by me on the following activities:  Development of treatment plan with patient or surrogate, discussions with consultants, evaluation of patient's response to treatment, examination of patient, ordering and review of laboratory studies, ordering and review of radiographic studies, ordering and performing treatments and interventions, pulse oximetry, re-evaluation of patient's condition, review of old charts and obtaining history from patient or surrogate   Care discussed with: admitting provider      Medications Ordered in ED Medications  octreotide (SANDOSTATIN) 2 mcg/mL load via infusion 50 mcg (50 mcg Intravenous Bolus from Bag 04/06/21 0344)    And  octreotide (SANDOSTATIN) 500 mcg in sodium chloride 0.9 % 250 mL (2 mcg/mL) infusion (50 mcg/hr Intravenous New Bag/Given 04/06/21 0347)  azithromycin (ZITHROMAX) 500 mg in sodium chloride 0.9 % 250 mL IVPB (has no administration in time range)  ondansetron (ZOFRAN) injection 4 mg (4 mg Intravenous Given 04/06/21 0233)  pantoprazole (PROTONIX) injection 40 mg (40 mg Intravenous Given 04/06/21 0234)  cefTRIAXone (ROCEPHIN) 1 g in sodium chloride 0.9 % 100 mL IVPB (0 g Intravenous Stopped 04/06/21 0340)  morphine (PF) 4 MG/ML injection 4 mg (4 mg Intravenous Given 04/06/21 0305)  furosemide (LASIX) injection 20 mg (20 mg Intravenous Given 04/06/21 0341)  HYDROmorphone (DILAUDID) injection 1 mg (1 mg Intravenous Given 04/06/21 0531)  promethazine (PHENERGAN) 12.5 mg in sodium chloride 0.9 % 50 mL IVPB (0 mg Intravenous Stopped 04/06/21 5053)    ED Course/ Medical Decision Making/ A&P Clinical Course as of 04/06/21 0608  Sun Apr 06, 2021  0402 Hemoglobin(!): 8.4 Pt with +FOBT, hematemesis, melena,. INR 4, BUN 60; HDS,  not urgently requiring blood transfusion at this time but would recommend trend H/h, he is agreeable to blood transfusion if it is necessary.  [SG]  [id number] Secure chat sent to GI for evaluation. Dr Candis Schatz.  [SG]    Clinical Course User Index [SG] Jeanell Sparrow, DO                           Medical Decision Making Amount and/or Complexity of Data Reviewed Labs: ordered. Decision-making details documented in ED Course. Radiology: ordered.  Risk Prescription drug management. Decision regarding hospitalization.    CC: abd pain, n/v/d  This patient presents to the Emergency Department for the above complaint. This involves an extensive number of treatment options and is a complaint that carries with it a high risk of complications and morbidity. Vital signs were reviewed. Serious etiologies considered.  Record review:  Previous records obtained and reviewed   Medical and surgical history as noted above.   Work up as above, notable for:  Labs & imaging results that were available during my care of the patient were reviewed by me and considered in my medical decision making.   I ordered imaging studies which included CXR, CTAP and I independently visualized and interpreted imaging which showed chest x-ray with bilateral pleural effusions, CT abdomen pelvis with possible duodenitis, bilateral pleural effusions again noted, hypodense lesion on the right lobe of the liver.  Cardiac monitoring reviewed and interpreted personally which shows NSR  Social determinants of health include - N/a  Personally discussed patient care with consultant; gastroenterology Dr. Candis Schatz  Management: Protonix bolus and infusion, octreotide bolus and infusion, Rocephin, azithromycin,  IVF, zofran, morphine  Reassessment:  Patient does report feeling somewhat better.  Labs reviewed, hemoglobin is mildly reduced from 1 week ago.  No need for emergent  transfusion at this time however would recommend  continue to trend H&H given likely ongoing UGI hemorrhage.  Patient is agreeable to transfusion if needed.  BUN is elevated, likely associated with GI bleeding.  INR is also 4.  Bili stable, mental status stable, LFTs stable.  No thinners.  No chronic alcohol abuse.  Unclear etiology.    Patient with likely UGI bleed, possible pneumonia, elevated INR.  Recommend admission.  Patient agreeable.  D/w Dr Tonie Griffith who accepts pt for admission.       This chart was dictated using voice recognition software.  Despite best efforts to proofread,  errors can occur which can change the documentation meaning.         Final Clinical Impression(s) / ED Diagnoses Final diagnoses:  Gastrointestinal hemorrhage, unspecified gastrointestinal hemorrhage type  ESRD (end stage renal disease) on dialysis ([place])  Generalized abdominal pain  Community acquired pneumonia, unspecified laterality  Elevated INR    Rx / DC Orders ED Discharge Orders     None         Jeanell Sparrow, DO 04/06/21 6384

## 2021-04-06 NOTE — Assessment & Plan Note (Addendum)
Started on antibiotics initially but then discontinued due to normal procalcitonin.  Leukocytosis thought to be reactive  Stable from a respiratory standpoint.  Will need home oxygen as discussed above.

## 2021-04-06 NOTE — Assessment & Plan Note (Addendum)
Patient presented with coffee-ground emesis.  Seen by gastroenterology.  Underwent EGD on 2/14 which showed nonbleeding gastric and duodenal ulcers.  These were biopsied.  PPI twice a day as recommended for 6 weeks followed by once a day.   Seems to be stable from a gastroenterology standpoint. Experienced nausea yesterday.  No vomiting.  Can use antiemetics as needed.

## 2021-04-06 NOTE — Assessment & Plan Note (Addendum)
Patient was on amlodipine metoprolol and clonidine prior to admission.  Amlodipine switched over to diltiazem due to atrial fibrillation Clonidine was resumed to avoid rebound hypertension.  Holding metoprolol for now since he is now on diltiazem. Changed over to long-acting diltiazem today at a slightly higher dose which should help the blood pressure.

## 2021-04-06 NOTE — Assessment & Plan Note (Addendum)
Stable

## 2021-04-06 NOTE — ED Notes (Signed)
Pt back from CT

## 2021-04-06 NOTE — Assessment & Plan Note (Addendum)
Has hx of chronic anemia with Hgb in the 9.5-10 range.  Hemoglobin dropped to 6.6.  Patient was transfused PRBC. Hemoglobin stable for the last 3 days.  No recurrence of bleeding.

## 2021-04-06 NOTE — ED Notes (Addendum)
Hgb 6.8-CRITICAL CALLED FROM LAB. Eddie Candle RN notified.

## 2021-04-06 NOTE — Assessment & Plan Note (Addendum)
Reason for elevated INR not clear.  Improved with vitamin K.

## 2021-04-06 NOTE — ED Triage Notes (Signed)
Pt bib [facility] from [facility] c/o vomiting x2 weeks, vomited blood today, and dark coffee ground diarrhea. Pt also has dark urine. Pt is a recent dialysis pt going T, TH, and Sat. Dialysis refused him today.   EMS vitals 92 HR 97 O2 149/59 BP

## 2021-04-06 NOTE — Assessment & Plan Note (Addendum)
Stable from a pulmonary standpoint.  He is noted to desaturate to mid 80s on room air.  He will need home oxygen.

## 2021-04-06 NOTE — ED Notes (Signed)
Patient transported to CT 

## 2021-04-06 NOTE — Consult Note (Addendum)
Referring Provider: Dr. Wynona Dove Primary Care Physician:  [name] Primary Gastroenterologist: Dr. Silverio Decamp  Reason for Consultation:  Anemia, GI bleed, hematemesis/melena  HPI: Wayne Meadows is a 87 y.o. male with a past medical history of hypertension, coronary artery disease s/p NSTEMI with RCA stent 2012, CVA, diabetes mellitus type 2, renal cell carcinoma status post resection in 2011, chronic kidney disease stage IV, pancreatitis, 2 cm pancreatic body lesion, colon perforation status post partial colectomy in 2013, diverticulosis, adenomatous colon polyps, segmental colitis in 2017.  He was admitted to the hospital 1/14 - 03/17/2021 due to having cellulitis to his lower extremity.  He was found to have AKI on CKD with a creatinine level 2.9 to 4.8.  He was evaluated by nephrology and underwent placement of right IJ HD catheter for hemodialysis.  He subsequently underwent R UE AV fistula placement on 04/03/2021.  He was discharged home to [facility] on 04/04/2021.  He developed hematemesis x 12 days and developed hematemesis 1 or 2 days ago.  He was transferred to [facility] ED by EMS for further evaluation. Not on blood thinners.  He is on ASA 81 mg daily.  Labs in the ED showed a Hg level of 8.5 (baseline Hg 9.5 on 04/01/2021). INR 4.  WBC 12.4.  Platelet 417 BUN 60.  Creatinine 6.65 (3.49 on 04/04/2021).  Albumin 1.8.  Lipase 65.  AST 12.  ALT 5.  Total bili 0.3.  SARS coronavirus 2 negative.  CTAP without contrast showed mild stranding surrounding the duodenal C-loop without evidence of bowel obstruction, colonic diverticulosis and small bilateral pleural effusions.  A 2.5 cm right liver lesion was also noted.  He had 2 large-volume episodes of hematemesis since arriving in the ED and a smear of black stool x1.  He denies having any dysphagia or heartburn.  He has intermittent generalized abdominal pain.  He reported passing dark stools for the past 10 days.  He  started vomiting several days ago.  He describes his emesis as bile green to black.  He denies any chest pain, palpitations or shortness of breath at this time.  No family at the bedside.  He underwent an EGD by Dr. Henrene Pastor 06/21/2020 during a hospital admission for N/V, abdominal pain and weight loss which showed an incidental esophageal stricture and possibly mild congested proximal esophageal mucosa.  These were not obtained as the patient was on Plavix at that time.  He was hospitalized for acute on chronic pancreatitis complicated by intramural gastric fluid collection 08/2020. He underwent EUS by Dr. Ardis Hughs in late May . Cytology showed acute inflammation and atypical cells, no malignancy. Follow-up MRCP on 7/18 shows resolving acute pancreatitis but also shows 2 cm lesion in pancreatic body concerning for neoplasm.  Multiple attempts were made by our office to contact the patient for EUS and biopsy, however, the patient did not respond.  Past GI Procedures:  EGD 06/21/2020: 1. Incidental esophageal stricture 2. Otherwise essentially normal EGD. Possibly mildly congested proximal esophageal mucosa as described.  Colonoscopy 08/22/2015: - Preparation of the colon was inadequate. - Two 5 to 7 mm polyps in the transverse colon, removed with a cold snare. Resected and retrieved. - Two 8 to 12 mm polyps in the rectum and in the transverse colon, removed with a hot snare. Resected and retrieved. - Granularity in the sigmoid colon. Biopsied. - Diverticulosis in the sigmoid colon, in the descending colon and in the transverse colon. - Non-bleeding internal hemorrhoids. -  No further colonoscopies recommended due to age 89. Colon, polyp(s), Transverse - TUBULAR ADENOMA, 2 FRAGMENTS. - HYPERPLASTIC POLYP, 1 FRAGMENT. - NO HIGH GRADE DYSPLASIA OR MALIGNANCY IDENTIFIED. 2. Colon, biopsy, Sigmoid - CHRONIC ACTIVE COLITIS. - NO DYSPLASIA OR MALIGNANCY IDENTIFIED. - SEE COMMENT.  Past Medical History:   Diagnosis Date   Arthritis    Carotid stenosis    COPD (chronic obstructive pulmonary disease) ([place])    Coronary artery disease    S/p PCI 2011;  NSTEMI 12/12:  LHC/PCI 02/23/11: LAD 60% after the septal perforator, D1 occluded with distal collaterals, proximal RI 30-40%, AV circumflex stent patent with 60% stenosis after the stent, RCA 99%, EF 60-65%.  His RCA was treated with a bare-metal stent   CVA (cerebral infarction) 2011   Right cerebral; total obstruction of the right ICA   Diverticulitis    Hypertension    Rectal bleeding 07/2015   Renal carcinoma (HCC)    Stroke (HCC)    Tobacco abuse, in remission     Past Surgical History:  Procedure Laterality Date   AV FISTULA PLACEMENT Right 04/03/2021   Procedure: ARTERIOVENOUS (AV) CREATION OF RIGHT ARM BRACHIOCEPHALIC FISTULA;  Surgeon: Cherre Robins, MD;  Location: [facility];  Service: Vascular;  Laterality: Right;   COLON SURGERY     COLONOSCOPY N/A 08/22/2015   Procedure: COLONOSCOPY;  Surgeon: [name], MD;  Location: [facility]COPY;  Service: Endoscopy;  Laterality: N/A;   ESOPHAGOGASTRODUODENOSCOPY N/A 07/18/2020   Procedure: ESOPHAGOGASTRODUODENOSCOPY (EGD);  Surgeon: Milus Banister, MD;  Location: [facility];  Service: Endoscopy;  Laterality: N/A;   ESOPHAGOGASTRODUODENOSCOPY (EGD) WITH PROPOFOL N/A 06/21/2020   Procedure: ESOPHAGOGASTRODUODENOSCOPY (EGD) WITH PROPOFOL;  Surgeon: Irene Shipper, MD;  Location: [facility] ENDOSCOPY;  Service: Endoscopy;  Laterality: N/A;   EUS N/A 07/18/2020   Procedure: UPPER ENDOSCOPIC ULTRASOUND (EUS) RADIAL;  Surgeon: Milus Banister, MD;  Location: [facility] ENDOSCOPY;  Service: Endoscopy;  Laterality: N/A;   FINE NEEDLE ASPIRATION N/A 07/18/2020   Procedure: FINE NEEDLE ASPIRATION (FNA) LINEAR;  Surgeon: Milus Banister, MD;  Location: [facility] ENDOSCOPY;  Service: Endoscopy;  Laterality: N/A;   hip relacement     IR FLUORO GUIDE CV LINE RIGHT  03/31/2021   IR US GUIDE VASC ACCESS RIGHT  03/31/2021    KIDNEY SURGERY     LEFT HEART CATH AND CORONARY ANGIOGRAPHY N/A 07/09/2020   Procedure: LEFT HEART CATH AND CORONARY ANGIOGRAPHY;  Surgeon: Leonie Man, MD;  Location: [facility];  Service: Cardiovascular;  Laterality: N/A;   LEFT HEART CATHETERIZATION WITH CORONARY ANGIOGRAM N/A 02/23/2011   Procedure: LEFT HEART CATHETERIZATION WITH CORONARY ANGIOGRAM;  Surgeon: [name], MD;  Location: [facility] C[facility] LAB;  Service: Cardiovascular;  Laterality: N/A;   LEFT HEART CATHETERIZATION WITH CORONARY ANGIOGRAM N/A 03/18/2011   Procedure: LEFT HEART CATHETERIZATION WITH CORONARY ANGIOGRAM;  Surgeon: [name], MD;  Location: [facility] CATH LAB;  Service: Cardiovascular;  Laterality: N/A;   PERCUTANEOUS CORONARY STENT INTERVENTION (PCI-S) N/A 02/23/2011   Procedure: PERCUTANEOUS CORONARY STENT INTERVENTION (PCI-S);  Surgeon: [name], MD;  Location: [facility] CATH LAB;  Service: Cardiovascular;  Laterality: N/A;   TEMPORARY PACEMAKER INSERTION N/A 02/23/2011   Procedure: TEMPORARY PACEMAKER INSERTION;  Surgeon: [name], MD;  Location: [facility] CATH LAB;  Service: Cardiovascular;  Laterality: N/A;   THROMBECTOMY W/ EMBOLECTOMY Right 04/03/2021   Procedure: THROMBECTOMY ARTERIOVENOUS FISTULA;  Surgeon: Cherre Robins, MD;  Location: [facility];  Service: Vascular;  Laterality: Right;  Prior to Admission medications   Medication Sig Start Date End Date Taking? Authorizing Provider  albuterol (VENTOLIN HFA) 108 (90 Base) MCG/ACT inhaler Inhale 1 puff into the lungs every 6 (six) hours as needed for shortness of breath.   Yes [provider]  hydrALAZINE (APRESOLINE) 10 MG tablet Take 1 tablet (10 mg total) by mouth every 8 (eight) hours as needed (for SBP greater than 180). 04/04/21  Yes Little Ishikawa, MD  Ipratropium-Albuterol (COMBIVENT) 20-100 MCG/ACT AERS respimat Inhale 1 puff into the lungs 4 (four) times daily.   Yes [provider]  lanthanum (FOSRENOL) 1000 MG chewable  tablet Chew 1 tablet (1,000 mg total) by mouth 3 (three) times daily with meals. 04/04/21  Yes Little Ishikawa, MD  pravastatin (PRAVACHOL) 40 MG tablet Take 40 mg by mouth at bedtime.   Yes [provider]  amLODipine (NORVASC) 10 MG tablet Take 1 tablet (10 mg total) by mouth daily. Patient not taking: Reported on 04/06/2021 07/11/20   Margie Billet, NP  aspirin EC 81 MG tablet Take 81 mg by mouth daily. Patient not taking: Reported on 04/06/2021    [provider]  cloNIDine (CATAPRES) 0.1 MG tablet Take 1 tablet (0.1 mg total) by mouth daily. Patient not taking: Reported on 04/06/2021 06/24/20   Harvie Heck, MD  ferrous sulfate 325 (65 FE) MG tablet Take 1 tablet by mouth. Every Monday, Wednesday, and Friday Patient not taking: Reported on 04/06/2021 07/30/20   [provider]  HYDROcodone-acetaminophen (NORCO) 10-325 MG tablet Take 1 tablet by mouth every 4 (four) hours as needed for up to 5 days for severe pain. Patient not taking: Reported on 04/06/2021 04/04/21 04/09/21  Little Ishikawa, MD  levothyroxine (SYNTHROID) 50 MCG tablet Take 50 mcg by mouth daily before breakfast. Patient not taking: Reported on 04/06/2021    [provider]  metoprolol tartrate (LOPRESSOR) 25 MG tablet Take 25 mg by mouth daily. Patient not taking: Reported on 04/06/2021    [provider]  Multiple Vitamin (MULTIVITAMIN) tablet Take 1 tablet by mouth daily. Patient not taking: Reported on 04/06/2021    [provider]  polyethylene glycol (MIRALAX / GLYCOLAX) 17 g packet Take 17 g by mouth daily as needed for mild constipation or moderate constipation. Patient not taking: Reported on 04/06/2021 08/15/20   [name], MD    Current Facility-Administered Medications  Medication Dose Route Frequency Provider Last Rate Last Admin   octreotide (SANDOSTATIN) 500 mcg in sodium chloride 0.9 % 250 mL (2 mcg/mL) infusion  50 mcg/hr Intravenous Continuous Wynona Dove  A, DO 25 mL/hr at 04/06/21 0347 50 mcg/hr at 04/06/21 0347   Current Outpatient Medications  Medication Sig Dispense Refill   albuterol (VENTOLIN HFA) 108 (90 Base) MCG/ACT inhaler Inhale 1 puff into the lungs every 6 (six) hours as needed for shortness of breath.     hydrALAZINE (APRESOLINE) 10 MG tablet Take 1 tablet (10 mg total) by mouth every 8 (eight) hours as needed (for SBP greater than 180). 90 tablet 0   Ipratropium-Albuterol (COMBIVENT) 20-100 MCG/ACT AERS respimat Inhale 1 puff into the lungs 4 (four) times daily.     lanthanum (FOSRENOL) 1000 MG chewable tablet Chew 1 tablet (1,000 mg total) by mouth 3 (three) times daily with meals. 90 tablet 0   pravastatin (PRAVACHOL) 40 MG tablet Take 40 mg by mouth at bedtime.     amLODipine (NORVASC) 10 MG tablet Take 1 tablet (10 mg total) by mouth daily. (  Patient not taking: Reported on 04/06/2021) 90 tablet 1   aspirin EC 81 MG tablet Take 81 mg by mouth daily. (Patient not taking: Reported on 04/06/2021)     cloNIDine (CATAPRES) 0.1 MG tablet Take 1 tablet (0.1 mg total) by mouth daily. (Patient not taking: Reported on 04/06/2021) 30 tablet 0   ferrous sulfate 325 (65 FE) MG tablet Take 1 tablet by mouth. Every Monday, Wednesday, and Friday (Patient not taking: Reported on 04/06/2021)     HYDROcodone-acetaminophen (NORCO) 10-325 MG tablet Take 1 tablet by mouth every 4 (four) hours as needed for up to 5 days for severe pain. (Patient not taking: Reported on 04/06/2021) 30 tablet 0   levothyroxine (SYNTHROID) 50 MCG tablet Take 50 mcg by mouth daily before breakfast. (Patient not taking: Reported on 04/06/2021)     metoprolol tartrate (LOPRESSOR) 25 MG tablet Take 25 mg by mouth daily. (Patient not taking: Reported on 04/06/2021)     Multiple Vitamin (MULTIVITAMIN) tablet Take 1 tablet by mouth daily. (Patient not taking: Reported on 04/06/2021)     polyethylene glycol (MIRALAX / GLYCOLAX) 17 g packet Take 17 g by mouth daily as needed for mild  constipation or moderate constipation. (Patient not taking: Reported on 04/06/2021)      Allergies as of 04/06/2021 - Review Complete 04/06/2021  Allergen Reaction Noted   Bee venom Anaphylaxis 02/22/2011   Influenza vaccines Other (See Comments) 07/13/2013    Family History  Problem Relation Age of Onset   Heart attack Other 5    Social History   Socioeconomic History   Marital status: Divorced    Spouse name: Not on file   Number of children: Not on file   Years of education: Not on file   Highest education level: Not on file  Occupational History   Not on file  Tobacco Use   Smoking status: Former    Types: Cigarettes    Quit date: 02/24/2007    Years since quitting: 14.1   Smokeless tobacco: Never  Vaping Use   Vaping Use: Never used  Substance and Sexual Activity   Alcohol use: Not Currently    Alcohol/week: 1.0 standard drink    Types: 1 Standard drinks or equivalent per week    Comment: socially    Drug use: No   Sexual activity: Not on file  Other Topics Concern   Not on file  Social History Narrative   Not on file   Social Determinants of Health   Financial Resource Strain: Not on file  Food Insecurity: Not on file  Transportation Needs: Not on file  Physical Activity: Not on file  Stress: Not on file  Social Connections: Not on file  Intimate Partner Violence: Not on file   Review of Systems: See HPI, all other systems reviewed and are negative  Physical Exam: Vital signs in last 24 hours: Temp:  [97.6 F (36.4 C)] 97.6 F (36.4 C) (02/12 0159) Pulse Rate:  [82-96] 87 (02/12 0845) Resp:  [13-21] 15 (02/12 0845) BP: (110-155)/(46-61) 124/55 (02/12 0845) SpO2:  [91 %-97 %] 91 % (02/12 0845) Weight:  [89.7 kg] 89.7 kg (02/12 0155)   General: Ill-appearing 87 year old male in no acute distress.  Dried blood on his mouth. Head:  Normocephalic and atraumatic. Eyes:  No scleral icterus. Conjunctiva pink. Ears:  Normal auditory acuity. Nose:  No  deformity, discharge or lesions. Mouth: Poor dentition no ulcers or lesions.  Neck:  Supple. No lymphadenopathy or thyromegaly.  Lungs: Breath sounds  clear, decreased in the bases. Heart: Regular rhythm, no murmurs. Abdomen: Distended, mild generalized tenderness without rebound or guarding.  Positive bowel sounds all 4 quadrants.  Midline abdominal scar intact. Rectal: Deferred.  Melena reported by the ED physician. Musculoskeletal:  Symmetrical without gross deformities.  Pulses:  Normal pulses noted. Extremities: Bilateral lower extremities with Ace wrap and dressing. Neurologic:  Alert and  oriented x 3. No focal deficits.  Skin:  Intact without significant lesions or rashes. Psych:  Alert and cooperative. Normal mood and affect.  Intake/Output from previous day: 02/11 0701 - 02/12 0700 In: 150 [IV Piggyback:150] Out: 325 [Urine:325] Intake/Output this shift: Total I/O In: 250 [IV Piggyback:250] Out: -   Lab Results: Recent Labs    04/06/21 0227  WBC 12.4*  HGB 8.4*  HCT 25.9*  PLT 417*   BMET Recent Labs    04/04/21 0349 04/06/21 0227  NA 137 136  K 3.7 4.5  CL 101 100  CO2 23 20*  GLUCOSE 66* 145*  BUN 20 60*  CREATININE 3.49* 5.65*  CALCIUM 8.0* 7.6*   LFT Recent Labs    04/06/21 0227  PROT 5.0*  ALBUMIN 1.8*  AST 12*  ALT 5  ALKPHOS 46  BILITOT 0.3   PT/INR Recent Labs    04/06/21 0227  LABPROT 38.9*  INR 4.0*   Hepatitis Panel No results for input(s): HEPBSAG, HCVAB, HEPAIGM, HEPBIGM in the last 72 hours.    Studies/Results: CT ABDOMEN PELVIS WO CONTRAST  Result Date: 04/06/2021 CLINICAL DATA:  Abdominal pain. EXAM: CT ABDOMEN AND PELVIS WITHOUT CONTRAST TECHNIQUE: Multidetector CT imaging of the abdomen and pelvis was performed following the standard protocol without IV contrast. RADIATION DOSE REDUCTION: This exam was performed according to the departmental dose-optimization program which includes automated exposure control,  adjustment of the mA and/or kV according to patient size and/or use of iterative reconstruction technique. COMPARISON:  CT abdomen pelvis dated 08/12/2020. FINDINGS: Evaluation of this exam is limited in the absence of intravenous contrast. Evaluation is also limited due to streak artifact caused by patient's arms. Lower chest: Partially visualized small bilateral pleural effusions, left greater right with associated partial compressive atelectasis of the lower lobes versus pneumonia. Clinical correlation is recommended. Three vessel coronary vascular calcification and small pericardial effusion. Partially visualized central venous line with tip at the cavoatrial junction. No intra-abdominal free air or free fluid. Hepatobiliary: Ill-defined 2.5 cm hypodense lesion in the right lobe of the liver not characterized on this CT. No intrahepatic biliary dilatation. Cholecystectomy. Pancreas: Atrophic pancreas with scattered coarse calcification suggestive of chronic pancreatitis. No active inflammatory changes. Spleen: Normal in size without focal abnormality. Adrenals/Urinary Tract: The adrenal glands are suboptimally visualized. There is no hydronephrosis or nephrolithiasis on either side. Bilateral renal cysts measure up to 4.5 cm in the upper pole of the right kidney. Additional smaller hypodense lesions are not characterized on this CT. The visualized ureters appear unremarkable. The urinary bladder is decompressed around a Foley catheter. Stomach/Bowel: Mild stranding surrounding the duodenal C-loop may represent duodenum this. Clinical correlation is recommended. There is sigmoid diverticulosis and scattered colonic diverticula without active inflammatory changes. No bowel obstruction. Vascular/Lymphatic: Advanced aortoiliac atherosclerotic disease. The IVC is unremarkable. No portal venous gas. There is no adenopathy. Reproductive: The prostate gland is poorly visualized. Other: Mild diffuse subcutaneous edema.  Musculoskeletal: Osteopenia with degenerative changes of the spine. Lower lumbar posterior fusion hardware as well as right hip arthroplasty. No acute osseous pathology. IMPRESSION: 1. Mild stranding surrounding the  duodenal C-loop may represent duodenum. No bowel obstruction. 2. Colonic diverticulosis. 3. No hydronephrosis or nephrolithiasis. 4. Partially visualized small bilateral pleural effusions, left greater right with associated partial compressive atelectasis of the lower lobes versus pneumonia. 5. Ill-defined 2.5 cm hypodense lesion in the right lobe of the liver not characterized on this CT. 6. Aortic Atherosclerosis (ICD10-I70.0). Electronically Signed   By: Anner Crete M.D.   On: 04/06/2021 03:16   DG Chest Portable 1 View  Result Date: 04/06/2021 CLINICAL DATA:  Difficulty breathing EXAM: PORTABLE CHEST 1 VIEW COMPARISON:  03/27/2021 FINDINGS: Hazy opacities in the bilateral lower lungs, reflecting combination of small to moderate bilateral pleural effusions (right greater than left) and suspected lower lobe compressive atelectasis. No frank interstitial edema. The heart is normal in size.  Mild thoracic aortic atherosclerosis. Right IJ dialysis catheter terminates in the upper right atrium. IMPRESSION: Small to moderate bilateral pleural effusions (right greater than left) with suspected lower lobe compressive atelectasis. Electronically Signed   By: Julian Hy M.D.   On: 04/06/2021 02:23    IMPRESSION/PLAN:  54) 87 year old male admitted to the hospital with upper GI bleed/melena/hematemesis with anemia.  Received Pantoprazole 40 mg IV x1.  On octreotide infusion.  Admission hemoglobin 8.5 down from baseline 9.5.  Hemodynamically stable at this time. -NPO -IV fluids per the hospitalist -Zofran 4 mg IV every 6 hours -EGD once INR corrected -Check H&H every 6 hours x 24-hour -Transfuse for hemoglobin less than 8 -Await further recommendations per Dr. Candis Schatz  2)  Coagulopathy.  INR 4.  Patient is not on anticoagulants.  Suspect sepsis driving elevated INR. -Vitamin K 10 mg IV ordered by the hospital -May require FFP he continues to demonstrate active GI bleeding  3) Leukocytosis.  Possible urosepsis versus uremia from recent hemodialysis catheter placement versus pneumonia.  Chest x-ray showed small to moderate bilateral pleural effusions left lower lobe atelectasis. -Blood and urine cultures if not already done  4) AKI on CKD. Recently started on HD via right subclavian dialysis catheter.  5) History of renal cell carcinoma status post resection in 2011  6) CAD  7) DM II   Patrecia Pour Kennedy-Smith  04/06/2021, 10:12 AM   I have taken a history, reviewed the chart and examined the patient. I performed a substantive portion of this encounter, including complete performance of at least one of the key components, in conjunction with the APP. I agree with the APP's note, impression and recommendations  87 year old male with history of coronary artery disease, CVA, renal cell carcinoma, end-stage renal disease with recent initiation of dialysis who presented with symptoms of upper GI bleed and acute drop in hemoglobin, found to have suspected inflammatory changes in the second portion of his duodenum.  When I examined the patient, he was not very interested in talking to me and gave me a very vague responses to questions in terms of how long he had been having his symptoms and what his stool looked like.  However, reviewing notes from from other providers as well as NP Quenton Fetter, it seems that he has been having melanic stools for a week or so now and started having hematemesis in the last 2 days.  Is also been having abdominal pain and poor p.o. intake.  Interestingly, his INR was 4.  He does not have cirrhosis and has no prior history of elevated INR.  I do not see that he has been given antibiotics recently.  I recommend that we give vitamin K  IV,  and recheck his INR.  If he continues to show significant active GI bleeding, would recommend transfusing with FFP.  Patient needs an upper endoscopy, but would like INR to be less than 2 as this improves the effectiveness of endoscopic hemostatic interventions if needed.  For now, recommend medically treating with high-dose acid suppression, serial CBCs with transfusions as needed.  Okay to continue octreotide, although low suspicion for variceal bleed, given no history of cirrhosis and EGD within the year showing no cirrhosis.  Dr. Bryan Lemma will be taking over the service tomorrow.  He will determine whether to proceed with an EGD tomorrow based on the patient's INR and clinical status.  Rowen Wilmer E. Candis Schatz, MD [facility]

## 2021-04-06 NOTE — H&P (Signed)
History and Physical    Patient: Wayne Meadows [id number] DOB: [birth year] DOA: 04/06/2021 DOS: the patient was seen and examined on 04/06/2021 PCP: [name]  Patient coming from: SNF  Chief Complaint:  Chief Complaint  Patient presents with   Rectal Bleeding    HPI: Wayne Meadows is a 87 y.o. male with medical history significant of HTN, ESRD on HD, COPD, chronic pancreatitis, hypothyroidism, CAD, hx of CVA who presents for evaluation of abdominal pain, nausea, vomiting.  Reports he has been having abdominal pain with nausea for the last week.  He reports he has been having bouts of vomiting over that time.  The abdominal pain and vomiting is gotten worse in the last 24 hours.  He reports he has had coffee-ground emesis as well as bright red blood with his vomit since yesterday afternoon.  Reports the abdominal pain is a cramping sharp pain in the upper and middle of his abdomen that does not radiate to his back or shoulders.  Reports he has some dark diarrhea in the last 24 hours as well.  He states he has not had any fever or chills.  He has shortness of breath with exertion which is not new for him.  He has not noted any coughing.  He denies any chest pain or palpitations.  He has not had any abdominal trauma.  Reports he does make a small amount of urine but has not paid attention to have his color is changed.  When the Foley catheter was placed in the emergency room he noted a very turbid dark-colored urine.  He is recently started on hemodialysis for his end-stage renal disease.  He last had dialysis Thursday and he is not sure when he was supposed to go back for dialysis again. He is not on any anticoagulation.  He reports he has been taking his medications He has a history of smoking but quit in 2009.  He denies alcohol or illicit drug use.  Emergency room [name] has been hemodynamically stable.  He was noted to have coffee-ground emesis in his mouth on exam  and melena.  Hemoccult of the stool was positive.  He was found to have a decrease in his hemoglobin level to 8.4.  Hemoglobin was 9.5 a few weeks ago when he was in the hospital.  He continues to have abdominal pain nausea and vomiting was given pain medication and antiemetics.  He was given octreotide as well as IV Protonix dose.  CT scan also noted bibasilar pneumonia and he was started on antibiotic with Rocephin and azithromycin.  ER physician sent consult by secure chat to gastroenterology to evaluate patient in the morning.  Hospitalist service asked to admit for further management  Review of Systems: As mentioned in the history of present illness. All other systems reviewed and are negative. Past Medical History:  Diagnosis Date   Arthritis    Carotid stenosis    COPD (chronic obstructive pulmonary disease) ([place])    Coronary artery disease    S/p PCI 2011;  NSTEMI 12/12:  LHC/PCI 02/23/11: LAD 60% after the septal perforator, D1 occluded with distal collaterals, proximal RI 30-40%, AV circumflex stent patent with 60% stenosis after the stent, RCA 99%, EF 60-65%.  His RCA was treated with a bare-metal stent   CVA (cerebral infarction) 2011   Right cerebral; total obstruction of the right ICA   Diverticulitis    Hypertension    Rectal bleeding 07/2015   Renal carcinoma ([place])  Stroke [facility])    Tobacco abuse, in remission    Past Surgical History:  Procedure Laterality Date   AV FISTULA PLACEMENT Right 04/03/2021   Procedure: ARTERIOVENOUS (AV) CREATION OF RIGHT ARM BRACHIOCEPHALIC FISTULA;  Surgeon: Cherre Robins, MD;  Location: [place];  Service: Vascular;  Laterality: Right;   COLON SURGERY     COLONOSCOPY N/A 08/22/2015   Procedure: COLONOSCOPY;  Surgeon: [name], MD;  Location: [place] ENDOSCOPY;  Service: Endoscopy;  Laterality: N/A;   ESOPHAGOGASTRODUODENOSCOPY N/A 07/18/2020   Procedure: ESOPHAGOGASTRODUODENOSCOPY (EGD);  Surgeon: Milus Banister, MD;  Location: [facility]  ENDOSCOPY;  Service: Endoscopy;  Laterality: N/A;   ESOPHAGOGASTRODUODENOSCOPY (EGD) WITH PROPOFOL N/A 06/21/2020   Procedure: ESOPHAGOGASTRODUODENOSCOPY (EGD) WITH PROPOFOL;  Surgeon: Irene Shipper, MD;  Location: [facility] ENDOSCOPY;  Service: Endoscopy;  Laterality: N/A;   EUS N/A 07/18/2020   Procedure: UPPER ENDOSCOPIC ULTRASOUND (EUS) RADIAL;  Surgeon: Milus Banister, MD;  Location: [facility] ENDOSCOPY;  Service: Endoscopy;  Laterality: N/A;   FINE NEEDLE ASPIRATION N/A 07/18/2020   Procedure: FINE NEEDLE ASPIRATION (FNA) LINEAR;  Surgeon: Milus Banister, MD;  Location: [facility] ENDOSCOPY;  Service: Endoscopy;  Laterality: N/A;   hip relacement     IR FLUORO GUIDE CV LINE RIGHT  03/31/2021   IR US GUIDE VASC ACCESS RIGHT  03/31/2021   KIDNEY SURGERY     LEFT HEART CATH AND CORONARY ANGIOGRAPHY N/A 07/09/2020   Procedure: LEFT HEART CATH AND CORONARY ANGIOGRAPHY;  Surgeon: Leonie Man, MD;  Location: [place] CV LAB;  Service: Cardiovascular;  Laterality: N/A;   LEFT HEART CATHETERIZATION WITH CORONARY ANGIOGRAM N/A 02/23/2011   Procedure: LEFT HEART CATHETERIZATION WITH CORONARY ANGIOGRAM;  Surgeon: [name], MD;  Location: [facility] CATH LAB;  Service: Cardiovascular;  Laterality: N/A;   LEFT HEART CATHETERIZATION WITH CORONARY ANGIOGRAM N/A 03/18/2011   Procedure: LEFT HEART CATHETERIZATION WITH CORONARY ANGIOGRAM;  Surgeon: [name], MD;  Location: [facility] CATH LAB;  Service: Cardiovascular;  Laterality: N/A;   PERCUTANEOUS CORONARY STENT INTERVENTION (PCI-S) N/A 02/23/2011   Procedure: PERCUTANEOUS CORONARY STENT INTERVENTION (PCI-S);  Surgeon: [name], MD;  Location: [facility] CATH LAB;  Service: Cardiovascular;  Laterality: N/A;   TEMPORARY PACEMAKER INSERTION N/A 02/23/2011   Procedure: TEMPORARY PACEMAKER INSERTION;  Surgeon: [name], MD;  Location: [facility] CATH LAB;  Service: Cardiovascular;  Laterality: N/A;   THROMBECTOMY W/ EMBOLECTOMY Right 04/03/2021   Procedure: THROMBECTOMY ARTERIOVENOUS  FISTULA;  Surgeon: Cherre Robins, MD;  Location: [place];  Service: Vascular;  Laterality: Right;   Social History:  reports that he quit smoking about 14 years ago. His smoking use included cigarettes. He has never used smokeless tobacco. He reports that he does not currently use alcohol after a past usage of about 1.0 standard drink per week. He reports that he does not use drugs.  Allergies  Allergen Reactions   Bee Venom Anaphylaxis    Has epi pen   Influenza Vaccines Other (See Comments)    "Mortally sick for 2 weeks"    Family History  Problem Relation Age of Onset   Heart attack Other 88    Prior to Admission medications   Medication Sig Start Date End Date Taking? Authorizing Provider  albuterol (VENTOLIN HFA) 108 (90 Base) MCG/ACT inhaler Inhale 1 puff into the lungs every 6 (six) hours as needed for shortness of breath.   Yes [provider]  hydrALAZINE (APRESOLINE) 10 MG tablet Take 1 tablet (10 mg total) by  mouth every 8 (eight) hours as needed (for SBP greater than 180). 04/04/21  Yes Little Ishikawa, MD  Ipratropium-Albuterol (COMBIVENT) 20-100 MCG/ACT AERS respimat Inhale 1 puff into the lungs 4 (four) times daily.   Yes [provider]  lanthanum (FOSRENOL) 1000 MG chewable tablet Chew 1 tablet (1,000 mg total) by mouth 3 (three) times daily with meals. 04/04/21  Yes Little Ishikawa, MD  pravastatin (PRAVACHOL) 40 MG tablet Take 40 mg by mouth at bedtime.   Yes [provider]  amLODipine (NORVASC) 10 MG tablet Take 1 tablet (10 mg total) by mouth daily. Patient not taking: Reported on 04/06/2021 07/11/20   Margie Billet, NP  aspirin EC 81 MG tablet Take 81 mg by mouth daily. Patient not taking: Reported on 04/06/2021    [provider]  cloNIDine (CATAPRES) 0.1 MG tablet Take 1 tablet (0.1 mg total) by mouth daily. Patient not taking: Reported on 04/06/2021 06/24/20   Harvie Heck, MD  ferrous sulfate 325 (65 FE) MG tablet Take 1  tablet by mouth. Every Monday, Wednesday, and Friday Patient not taking: Reported on 04/06/2021 07/30/20   [provider]  HYDROcodone-acetaminophen (NORCO) 10-325 MG tablet Take 1 tablet by mouth every 4 (four) hours as needed for up to 5 days for severe pain. Patient not taking: Reported on 04/06/2021 04/04/21 04/09/21  Little Ishikawa, MD  levothyroxine (SYNTHROID) 50 MCG tablet Take 50 mcg by mouth daily before breakfast. Patient not taking: Reported on 04/06/2021    [provider]  metoprolol tartrate (LOPRESSOR) 25 MG tablet Take 25 mg by mouth daily. Patient not taking: Reported on 04/06/2021    [provider]  Multiple Vitamin (MULTIVITAMIN) tablet Take 1 tablet by mouth daily. Patient not taking: Reported on 04/06/2021    [provider]  polyethylene glycol (MIRALAX / GLYCOLAX) 17 g packet Take 17 g by mouth daily as needed for mild constipation or moderate constipation. Patient not taking: Reported on 04/06/2021 08/15/20   [name], MD    Physical Exam: Vitals:   04/06/21 0345 04/06/21 0400 04/06/21 0430 04/06/21 0500  BP: (!) 130/57 (!) 145/60 (!) 138/51 (!) 128/52  Pulse: 87 96 90 82  Resp: 18 (!) 21 19 18   Temp:      SpO2: 95% 95% 93% 94%  Weight:      Height:       General: WD, Alert and oriented x3. Chronically Ill appearing Eyes: EOMI, PERRL, conjunctivae pale.  Sclera nonicteric HENT:  [place]/AT, external ears normal.  Nares patent without epistasis.  Mucous membranes are dry. Posterior pharynx clear of any exudate Neck: Soft, normal range of motion, supple, no masses,  Trachea midline Respiratory: Equal but diminished breath sounds with bibasilar rhonchi more pronounced on the left.  No wheezing. Normal respiratory effort. No accessory muscle use.  Cardiovascular: Regular rate and rhythm, no murmurs / rubs / gallops.  Bilateral Un[place] boots. HD catheter in right upper chest.  Abdomen: Soft, generalized tenderness, nondistended, no  rebound or guarding.  No masses palpated. Bowel sounds hypoactive Musculoskeletal: FROM. no cyanosis. No joint deformity upper and lower extremities. Normal muscle tone.  Skin: Warm, dry, intact no rashes. No induration Neurologic: CN 2-12 grossly intact.  Normal speech. Sensation intact, Grip strength 5/5 bilaterally Psychiatric: Normal judgment and insight.  Normal mood.    Data Reviewed: Lab work reveals WBC of 12,400 hemoglobin 8.4 hematocrit 25.9 platelets 417,000; sodium 136 potassium 4.5 chloride 100 bicarb 20 creatinine 5.65 BUN 60 glucose  145 albumin 1.8 lipase 65 alkaline phosphatase 46 AST 12 ALT 5 total bilirubin 0.3; INR 4.0 Hemoccult positive stool.  COVID-negative influenza A and B are negative .  EKG shows normal sinus rhythm with minimal ST depression but no acute ST elevation.  QTc 468  CT of the abdomen pelvis shows mild stranding around the duodenal C-loop with no bowel obstruction.  There is chronic pancreatitis noted.  Small bilateral pleural effusions left greater than right with bibasilar compression atelectasis versus pneumonia.  Assessment and Plan: * Acute upper GI bleed- (present on admission) Mr. Dinkins is admitted to Progressive care unit.  Started on IV protonix, octreotide. GI consulted by secure chat by ER physician to evaluate pt in am.  NPO Antiemetic provided as needed.  Pain control provided.  IVF hydration with LR @ 100 ml/hr Check serial Hgb/Hct levels  ESRD on hemodialysis [facility]) Consult nephrology in am to evaluate pt for HD needs.  Pt states last had HD on Thursday and he is not sure when he is suppose to have it again  Acute blood loss anemia- (present on admission) Has hx of chronic anemia with Hgb in the 9.5-10 range. Is lower today with GI bleeding.  Monitor Hgb/Hct and if drops will need PRBC transfusion  Community acquired pneumonia Started on rocephin and azithromycin for antibiotic coverage. Recheck CBC in am.  Supplemental oxygen  as needed to keep O2 sat 93-96%   Essential hypertension- (present on admission) Continue home medication of  hydralazine. Pt reports not taking metoprolol or norvasc any longer Monitor BP  COPD (chronic obstructive pulmonary disease) ([place])- (present on admission) Duonebs as needed every 6 hours.  Incentive spirometer every 2 hours while awake  Chronic pancreatitis ([place])- (present on admission) Pain control provided. CT scan shows chronic pancreatitis and lipase mildly elevated.  Supratherapeutic INR Monitor INR. Pt not on anticoagulation   Advance Care Planning: CODE STATUS: Full code   SCDs for DVT prophylaxis  Consults: Gastroenterology consulted by secure chat by ER physician.  Nephrology will be consulted in the morning  Family Communication: Diagnosis and plan discussed with patient.  Patient verbalized understanding agrees with plan.  Further recommendation follow as clinical indicated   Author: Eben Burow, MD 04/06/2021 5:11 AM  For on call review [email or web address].

## 2021-04-06 NOTE — Assessment & Plan Note (Addendum)
Urinary retention  Nephrology is following.  He is on a Monday Wednesday Friday schedule.   Recently underwent fistula placement.  As result he still has right arm swelling.  Patient was seen by vascular surgery and they did not see any obvious areas of concerns.  Has good radial pulses.  Antibiotic lotion ordered for the wound. Noted to have a Foley catheter.  Review of his records suggest that it was placed in January for possible retention.  At that time he had presented with acute kidney injury.  Flomax was initiated.  Recommend doing a voiding trial at skilled nursing facility once he is more mobile.

## 2021-04-06 NOTE — Progress Notes (Signed)
°  PROGRESS NOTE    Wayne Meadows  YYQ:[id number] DOB: 12/02/1934 DOA: 04/06/2021 PCP: [name]   Brief Narrative:  Please see H/P done this am for full history - briefly:  Wayne Meadows is a 87 y.o. male with history of chronic kidney disease stage IIIb, CVA and CAD, diabetes mellitus, COPD, hypertension was admitted twice in the recent past - once for cellulitis of the lower extremity, discharged on antibiotics, second for worsening renal function and transitioned to HD due to acute renal failure. Discharged 2/10 and stable for PT, tolerating dialysis well. Patient presents back to hospital again from SNF for episode of coffee ground emesis overnight. GI consulted for follow up.  2/6 Status post right IJ tunneled HD catheter placed by IR 2/7 Tolerated first HD 2/8 Complaining of nausea, resolved with treatment 2/9 RUE Brachiocephalic AV fistula placement 2/10 - DISCHARGED TO REHAB 2/11 - READMITTED DUE TO COFFEE GROUND EMESIS AND WORSENING ANEMIA  Assessment & Plan:   Acute on chronic anemia - rule out GI bleed -GI following - defer to their expertise for further imaging/endoscopy - INR inappropriately elevated - vitamin K x1 dose - follow INR - Octreotide ongoing  Questionable HCAP ruled out - leukemoid reaction Patient does not meet sepsis criteria -discontinue antibiotics given negative procalcitonin and has no overt symptoms, leukocytosis likely reactive given above symptomatic anemia; continue to follow clinically   LOS: 0 days   Time spent: 22min  Silvia C Seryna Marek, DO [facility]  If 7PM-7AM, please contact night-coverage [email or web address]  04/06/2021, 7:44 AM

## 2021-04-06 NOTE — ED Notes (Signed)
Pts transport to the floor  delayed   no one to transport upstairs  3 pts needing to go

## 2021-04-07 DIAGNOSIS — D62 Acute posthemorrhagic anemia: Secondary | ICD-10-CM

## 2021-04-07 DIAGNOSIS — N186 End stage renal disease: Secondary | ICD-10-CM

## 2021-04-07 DIAGNOSIS — Z992 Dependence on renal dialysis: Secondary | ICD-10-CM

## 2021-04-07 DIAGNOSIS — R791 Abnormal coagulation profile: Secondary | ICD-10-CM

## 2021-04-07 LAB — COMPREHENSIVE METABOLIC PANEL
ALT: <5 U/L (ref 0–44)
AST: 16 U/L (ref 15–41)
Albumin: 1.9 g/dL — ABNORMAL LOW (ref 3.5–5.0)
Alkaline Phosphatase: 40 U/L (ref 38–126)
Anion gap: 17 — ABNORMAL HIGH (ref 5–15)
BUN: 81 mg/dL — ABNORMAL HIGH (ref 8–23)
CO2: 17 mmol/L — ABNORMAL LOW (ref 22–32)
Calcium: 7.5 mg/dL — ABNORMAL LOW (ref 8.9–10.3)
Chloride: 106 mmol/L (ref 98–111)
Creatinine, Ser: 7.07 mg/dL — ABNORMAL HIGH (ref 0.61–1.24)
GFR, Estimated: 7 mL/min — ABNORMAL LOW (ref >60)
Glucose, Bld: 104 mg/dL — ABNORMAL HIGH (ref 70–99)
Potassium: 4.8 mmol/L (ref 3.5–5.1)
Sodium: 140 mmol/L (ref 135–145)
Total Bilirubin: 0.8 mg/dL (ref 0.3–1.2)
Total Protein: 4.6 g/dL — ABNORMAL LOW (ref 6.5–8.1)

## 2021-04-07 LAB — CBC
HCT: 25.2 % — ABNORMAL LOW (ref 39.0–52.0)
Hemoglobin: 8.6 g/dL — ABNORMAL LOW (ref 13.0–17.0)
MCH: 29.4 pg (ref 26.0–34.0)
MCHC: 34.1 g/dL (ref 30.0–36.0)
MCV: 86 fL (ref 80.0–100.0)
Platelets: 258 10*3/uL (ref 150–400)
RBC: 2.93 MIL/uL — ABNORMAL LOW (ref 4.22–5.81)
RDW: 16.9 % — ABNORMAL HIGH (ref 11.5–15.5)
WBC: 14.3 10*3/uL — ABNORMAL HIGH (ref 4.0–10.5)
nRBC: 0 % (ref 0.0–0.2)

## 2021-04-07 LAB — PROTIME-INR
INR: 1.2 (ref 0.8–1.2)
Prothrombin Time: 15.5 seconds — ABNORMAL HIGH (ref 11.4–15.2)

## 2021-04-07 LAB — HEMOGLOBIN AND HEMATOCRIT, BLOOD
HCT: 27 % — ABNORMAL LOW (ref 39.0–52.0)
Hemoglobin: 9 g/dL — ABNORMAL LOW (ref 13.0–17.0)

## 2021-04-07 MED ORDER — CHLORHEXIDINE GLUCONATE CLOTH 2 % EX PADS
6.0000 | MEDICATED_PAD | Freq: Every day | CUTANEOUS | Status: DC
  Administered 2021-04-07 – 2021-04-10 (×3): 6 via TOPICAL

## 2021-04-07 MED ORDER — HYDROCODONE-ACETAMINOPHEN 5-325 MG PO TABS
1.0000 | ORAL_TABLET | ORAL | Status: DC | PRN
  Administered 2021-04-07 – 2021-04-09 (×4): 2 via ORAL
  Administered 2021-04-09: 1 via ORAL
  Administered 2021-04-10: 2 via ORAL
  Administered 2021-04-10 – 2021-04-11 (×3): 1 via ORAL
  Filled 2021-04-07 (×5): qty 2
  Filled 2021-04-07 (×2): qty 1
  Filled 2021-04-07 (×2): qty 2

## 2021-04-07 MED ORDER — HYDROMORPHONE HCL 1 MG/ML IJ SOLN
0.5000 mg | INTRAMUSCULAR | Status: DC | PRN
  Administered 2021-04-08 – 2021-04-11 (×4): 0.5 mg via INTRAVENOUS
  Filled 2021-04-07 (×4): qty 0.5

## 2021-04-07 MED ORDER — HEPARIN SODIUM (PORCINE) 1000 UNIT/ML IJ SOLN
INTRAMUSCULAR | Status: AC
  Administered 2021-04-07: 1000 [IU]
  Filled 2021-04-07: qty 4

## 2021-04-07 NOTE — Plan of Care (Signed)

## 2021-04-07 NOTE — Progress Notes (Addendum)
Date and time results received: 04/06/2021 :2300  Test: hgb  Critical Value: 6.6  Name of Provider Notified: X. Blount  Orders Received? Or Actions Taken?: Transfuse 2 units PRBC

## 2021-04-07 NOTE — Plan of Care (Addendum)
Dr. Avon Gully at bedside, voiced concern about right arm swelling and pain. Oral PRN meds ordered.   Problem: Education: Goal: Knowledge of General Education information will improve Description: Including pain rating scale, medication(s)/side effects and non-pharmacologic comfort measures Outcome: Progressing   Problem: Activity: Goal: Risk for activity intolerance will decrease Outcome: Progressing   Problem: Pain Managment: Goal: General experience of comfort will improve Outcome: Progressing   Problem: Safety: Goal: Ability to remain free from injury will improve Outcome: Progressing   Problem: Skin Integrity: Goal: Risk for impaired skin integrity will decrease Outcome: Progressing

## 2021-04-07 NOTE — Progress Notes (Signed)
PROGRESS NOTE    Wayne Meadows  IWP:[id number] DOB: 07-02-1934 DOA: 04/06/2021 PCP: [name]   Brief Narrative:  Wayne Meadows is a 87 y.o. male with history of chronic kidney disease stage IIIb, CVA and CAD, diabetes mellitus, COPD, hypertension was admitted twice in the recent past - once for cellulitis of the lower extremity, discharged on antibiotics, second for worsening renal function and transitioned to HD due to acute renal failure. Discharged 2/10 and stable for PT, tolerating dialysis well. Patient presents back to hospital again from SNF for episode of coffee ground emesis overnight. GI consulted for follow up.   2/6 Status post right IJ tunneled HD catheter placed by IR 2/7 Tolerated first HD 2/8 Complaining of nausea, resolved with treatment 2/9 RUE Brachiocephalic AV fistula placement 2/10 - DISCHARGED TO REHAB 2/11 - READMITTED DUE TO [place] AND WORSENING ANEMIA 2/12-13 - Continues to be anemic - EGD pending INR correction  Assessment & Plan:   Principal Problem:   Acute upper GI bleed Active Problems:   COPD (chronic obstructive pulmonary disease) (HCC)   Essential hypertension   Acute blood loss anemia   Chronic pancreatitis ([place])   ESRD on hemodialysis (HCC)   Supratherapeutic INR   Community acquired pneumonia  Acute on chronic anemia -likely acute upper GI bleed - GI following - defer to their expertise for further imaging/endoscopy - INR inappropriately elevated at intake corrected to 1.2 today with vitamin K - EGD pending INR correction per GI signed out - Octreotide ongoing   Questionable HCAP ruled out - leukemoid reaction Patient does not meet sepsis criteria -discontinue antibiotics given negative procalcitonin and has no overt symptoms, leukocytosis likely reactive given above symptomatic anemia; continue to follow clinically  Recently noted ESRD with non-anion gap metabolic acidosis, resolving: Recently  discharged with new IJ catheter for ongoing dialysis Right IJ catheter 2/6 - RUE brachiocephalic fistula 2/9  Plavix on hold in the setting of likely active GI bleed   Gross bilateral lower extremity edema anasarca/venous insufficiency.   Secondary to above Improving with fluid management with dialysis   CAD, chronic History of coronary stent placement. Continue aspirin; Holding Plavix for primary dialysis access placement by vascular    Essential hypertension  Continue clonidine, amlodipine, metoprolol   Hypothyroidism, chronic Continue Synthroid   COPD, chronic  Compensated at this time.  Continue bronchodilators.   Chronic anemia, of chronic disease, stable Appears to be at baseline around 9-10.   Debility, deconditioning Ambulatory dysfunction, chronic Uses a walker and cane at home  - recent DC to SNF - plan to return post workup as above  DVT prophylaxis: None given above Code Status: Full Family Communication: None present  Status is: Inpatient  Dispo: The patient is from: Facility              Anticipated d/c is to: Same              Anticipated d/c date is: 48 to 72 hours pending clinical course              Patient currently not medically stable for discharge  Consultants:  GI, nephrology  Procedures:  Pending above  Antimicrobials:  None  Subjective: No acute issues or events overnight, appears volume overloaded continues to have issues with phlebotomy due to hard stick -admits to ongoing nausea but denies vomiting chest pain shortness of breath headache fevers chills diarrhea constipation  Objective: Vitals:   04/07/21 0108 04/07/21 0330 04/07/21  0345 04/07/21 0445  BP: (!) 152/56  (!) 154/58 (!) 155/72  Pulse: 93   91  Resp: 16   19  Temp: 98.6 F (37 C) 98.6 F (37 C)  98.5 F (36.9 C)  TempSrc: Oral Oral  Oral  SpO2:    97%  Weight:      Height:        Intake/Output Summary (Last 24 hours) at 04/07/2021 0747 Last data filed at  04/07/2021 0315 Gross per 24 hour  Intake 815 ml  Output --  Net 815 ml   Filed Weights   04/06/21 0155  Weight: 89.7 kg    Examination:  General:  Pleasantly resting in bed, No acute distress. HEENT:  Normocephalic atraumatic.  Sclerae nonicteric, noninjected.  Extraocular movements intact bilaterally. Neck:  Without mass or deformity.  Trachea is midline. Lungs:  Clear to auscultate bilaterally without rhonchi, wheeze, or rales. Heart:  Regular rate and rhythm.  Without murmurs, rubs, or gallops. Abdomen:  Soft, nontender, nondistended.  Without guarding or rebound. Extremities: Without cyanosis, clubbing, edema, or obvious deformity. Vascular:  Dorsalis pedis and posterior tibial pulses palpable bilaterally. Skin:  Warm and dry, no erythema, no ulcerations.  Data Reviewed: I have personally reviewed following labs and imaging studies  CBC: Recent Labs  Lab 04/01/21 0857 04/06/21 0227 04/06/21 0956 04/06/21 2206  WBC 8.1 12.4*  --  13.1*  NEUTROABS  --  10.9*  --   --   HGB 9.6* 8.4* 6.8* 6.6*  HCT 30.8* 25.9* 22.8* 20.1*  MCV 93.1 92.8  --  86.6  PLT 275 417*  --  831   Basic Metabolic Panel: Recent Labs  Lab 04/01/21 0314 04/02/21 0305 04/03/21 0104 04/04/21 0349 04/06/21 0227  NA 141 136 139 137 136  K 4.4 5.4* 4.0 3.7 4.5  CL 104 103 103 101 100  CO2 23 17* 21* 23 20*  GLUCOSE 73 71 77 66* 145*  BUN 56* 33* 40* 20 60*  CREATININE 5.73* 4.33* 5.29* 3.49* 5.65*  CALCIUM 7.9* 7.9* 8.0* 8.0* 7.6*  PHOS 5.7* 4.7* 5.8* 4.4  --    GFR: Estimated Creatinine Clearance: 11.3 mL/min (A) (by C-G formula based on SCr of 5.65 mg/dL (H)). Liver Function Tests: Recent Labs  Lab 04/01/21 0314 04/02/21 0305 04/03/21 0104 04/04/21 0349 04/06/21 0227  AST  --   --   --   --  12*  ALT  --   --   --   --  5  ALKPHOS  --   --   --   --  46  BILITOT  --   --   --   --  0.3  PROT  --   --   --   --  5.0*  ALBUMIN 2.2* 2.1* 2.1* 2.1* 1.8*   Recent Labs  Lab  04/06/21 0227  LIPASE 65*   No results for input(s): AMMONIA in the last 168 hours. Coagulation Profile: Recent Labs  Lab 04/06/21 0227  INR 4.0*   Cardiac Enzymes: No results for input(s): CKTOTAL, CKMB, CKMBINDEX, TROPONINI in the last 168 hours. BNP (last 3 results) No results for input(s): PROBNP in the last 8760 hours. HbA1C: No results for input(s): HGBA1C in the last 72 hours. CBG: No results for input(s): GLUCAP in the last 168 hours. Lipid Profile: No results for input(s): CHOL, HDL, LDLCALC, TRIG, CHOLHDL, LDLDIRECT in the last 72 hours. Thyroid Function Tests: No results for input(s): TSH, T4TOTAL, FREET4, T3FREE, THYROIDAB in the  last 72 hours. Anemia Panel: No results for input(s): VITAMINB12, FOLATE, FERRITIN, TIBC, IRON, RETICCTPCT in the last 72 hours. Sepsis Labs: Recent Labs  Lab 04/06/21 0956  PROCALCITON 0.29    Recent Results (from the past 240 hour(s))  Surgical pcr screen     Status: Abnormal   Collection Time: 04/03/21  7:05 AM   Specimen: Nasal Mucosa; Nasal Swab  Result Value Ref Range Status   MRSA, PCR POSITIVE (A) NEGATIVE Final    Comment: RESULT CALLED TO, READ BACK BY AND VERIFIED WITH: RN Charlene Brooke [phone] 25 MLM    Staphylococcus aureus POSITIVE (A) NEGATIVE Final    Comment: (NOTE) The Xpert SA Assay (FDA approved for NASAL specimens in patients 2 years of age and older), is one component of a comprehensive surveillance program. It is not intended to diagnose infection nor to guide or monitor treatment. Performed at [facility], [place] [address], [place], [place] [address]   Resp Panel by RT-PCR (Flu A&B, Covid) Nasopharyngeal Swab     Status: None   Collection Time: 04/06/21  2:04 AM   Specimen: Nasopharyngeal Swab; Nasopharyngeal(NP) swabs in vial transport medium  Result Value Ref Range Status   SARS Coronavirus 2 by RT PCR NEGATIVE NEGATIVE Final    Comment: (NOTE) SARS-CoV-2 target nucleic acids are NOT  DETECTED.  The SARS-CoV-2 RNA is generally detectable in upper respiratory specimens during the acute phase of infection. The lowest concentration of SARS-CoV-2 viral copies this assay can detect is 138 copies/mL. A negative result does not preclude SARS-Cov-2 infection and should not be used as the sole basis for treatment or other patient management decisions. A negative result may occur with  improper specimen collection/handling, submission of specimen other than nasopharyngeal swab, presence of viral mutation(s) within the areas targeted by this assay, and inadequate number of viral copies(<138 copies/mL). A negative result must be combined with clinical observations, patient history, and epidemiological information. The expected result is Negative.  Fact Sheet for Patients:  EntrepreneurPulse.com.au  Fact Sheet for Healthcare Providers:  IncredibleEmployment.be  This test is no t yet approved or cleared by the [place] FDA and  has been authorized for detection and/or diagnosis of SARS-CoV-2 by FDA under an Emergency Use Authorization (EUA). This EUA will remain  in effect (meaning this test can be used) for the duration of the COVID-19 declaration under Section 564(b)(1) of the Act, 21 U.S.C.section 360bbb-3(b)(1), unless the authorization is terminated  or revoked sooner.       Influenza A by PCR NEGATIVE NEGATIVE Final   Influenza B by PCR NEGATIVE NEGATIVE Final    Comment: (NOTE) The Xpert Xpress SARS-CoV-2/FLU/RSV plus assay is intended as an aid in the diagnosis of influenza from Nasopharyngeal swab specimens and should not be used as a sole basis for treatment. Nasal washings and aspirates are unacceptable for Xpert Xpress SARS-CoV-2/FLU/RSV testing.  Fact Sheet for Patients: EntrepreneurPulse.com.au  Fact Sheet for Healthcare Providers: IncredibleEmployment.be  This test is not yet  approved or cleared by the [place] FDA and has been authorized for detection and/or diagnosis of SARS-CoV-2 by FDA under an Emergency Use Authorization (EUA). This EUA will remain in effect (meaning this test can be used) for the duration of the COVID-19 declaration under Section 564(b)(1) of the Act, 21 U.S.C. section 360bbb-3(b)(1), unless the authorization is terminated or revoked.  Performed at [facility], [place] [address] [place], [place] [address]  Radiology Studies: CT ABDOMEN PELVIS WO CONTRAST  Result Date: 04/06/2021 CLINICAL DATA:  Abdominal pain. EXAM: CT ABDOMEN AND PELVIS WITHOUT CONTRAST TECHNIQUE: Multidetector CT imaging of the abdomen and pelvis was performed following the standard protocol without IV contrast. RADIATION DOSE REDUCTION: This exam was performed according to the departmental dose-optimization program which includes automated exposure control, adjustment of the mA and/or kV according to patient size and/or use of iterative reconstruction technique. COMPARISON:  CT abdomen pelvis dated 08/12/2020. FINDINGS: Evaluation of this exam is limited in the absence of intravenous contrast. Evaluation is also limited due to streak artifact caused by patient's arms. Lower chest: Partially visualized small bilateral pleural effusions, left greater right with associated partial compressive atelectasis of the lower lobes versus pneumonia. Clinical correlation is recommended. Three vessel coronary vascular calcification and small pericardial effusion. Partially visualized central venous line with tip at the cavoatrial junction. No intra-abdominal free air or free fluid. Hepatobiliary: Ill-defined 2.5 cm hypodense lesion in the right lobe of the liver not characterized on this CT. No intrahepatic biliary dilatation. Cholecystectomy. Pancreas: Atrophic pancreas with scattered coarse calcification suggestive of chronic pancreatitis. No active inflammatory  changes. Spleen: Normal in size without focal abnormality. Adrenals/Urinary Tract: The adrenal glands are suboptimally visualized. There is no hydronephrosis or nephrolithiasis on either side. Bilateral renal cysts measure up to 4.5 cm in the upper pole of the right kidney. Additional smaller hypodense lesions are not characterized on this CT. The visualized ureters appear unremarkable. The urinary bladder is decompressed around a Foley catheter. Stomach/Bowel: Mild stranding surrounding the duodenal C-loop may represent duodenum this. Clinical correlation is recommended. There is sigmoid diverticulosis and scattered colonic diverticula without active inflammatory changes. No bowel obstruction. Vascular/Lymphatic: Advanced aortoiliac atherosclerotic disease. The IVC is unremarkable. No portal venous gas. There is no adenopathy. Reproductive: The prostate gland is poorly visualized. Other: Mild diffuse subcutaneous edema. Musculoskeletal: Osteopenia with degenerative changes of the spine. Lower lumbar posterior fusion hardware as well as right hip arthroplasty. No acute osseous pathology. IMPRESSION: 1. Mild stranding surrounding the duodenal C-loop may represent duodenum. No bowel obstruction. 2. Colonic diverticulosis. 3. No hydronephrosis or nephrolithiasis. 4. Partially visualized small bilateral pleural effusions, left greater right with associated partial compressive atelectasis of the lower lobes versus pneumonia. 5. Ill-defined 2.5 cm hypodense lesion in the right lobe of the liver not characterized on this CT. 6. Aortic Atherosclerosis (ICD10-I70.0). Electronically Signed   By: Anner Crete M.D.   On: 04/06/2021 03:16   DG Chest Portable 1 View  Result Date: 04/06/2021 CLINICAL DATA:  Difficulty breathing EXAM: PORTABLE CHEST 1 VIEW COMPARISON:  03/27/2021 FINDINGS: Hazy opacities in the bilateral lower lungs, reflecting combination of small to moderate bilateral pleural effusions (right greater than  left) and suspected lower lobe compressive atelectasis. No frank interstitial edema. The heart is normal in size.  Mild thoracic aortic atherosclerosis. Right IJ dialysis catheter terminates in the upper right atrium. IMPRESSION: Small to moderate bilateral pleural effusions (right greater than left) with suspected lower lobe compressive atelectasis. Electronically Signed   By: Julian Hy M.D.   On: 04/06/2021 02:23    Scheduled Meds:  sodium chloride   Intravenous Once   sodium chloride   Intravenous Once   Chlorhexidine Gluconate Cloth  6 each Topical Daily   pravastatin  40 mg Oral QHS   Continuous Infusions:  azithromycin Stopped (04/06/21 2129)   cefTRIAXone (ROCEPHIN)  IV Stopped (04/06/21 1936)   lactated ringers Stopped (04/07/21 0045)   octreotide  (SANDOSTATIN)  IV infusion 50 mcg/hr (04/06/21 2337)   pantoprazole Stopped (04/07/21 0045)     LOS: 1 day   Time spent: 59min  Barry C Rafeef Lau, DO [facility]  If 7PM-7AM, please contact night-coverage [email or web address]  04/07/2021, 7:47 AM

## 2021-04-07 NOTE — Progress Notes (Addendum)
Patient ID: Wayne Meadows, male   DOB: 18-May-1934, 87 y.o.   MRN: [id number]     Attending physician's note   I have reviewed the chart and discussed his care on rounds. I performed a substantive portion of this encounter, including complete performance of at least one of the key components, in conjunction with the APP. I agree with the APP's note, impression, and recommendations with my edits.   INR now 1.2.  Unclear why it was 4.0 on admission, but robust response to vitamin K.  Otherwise Hgb decreased to 6.6 earlier today, responsive to 3 unit PRBC transfusion with posttransfusion H/H 8.6/25.2.  Iron studies were normal in January.  Baseline Hgb ~9.5.  - Continue serial H/H checks with additional blood products as needed per protocol - Plan for EGD tomorrow for diagnostic and potentially therapeutic intent - Continue high-dose PPI - Clears okay today with n.p.o. at midnight - HD today per Nephrology  Gerrit Heck, DO, FACG [phone] office          Progress Note   Subjective   Day # 2  CC; GI bleed- melena, hematemesis  Patient says he has not had any bowel movements or melena and no further vomiting or hematemesis   INR 4.0  on admit ( no thinners )- Pending today HGB down to 6.6 this am from 8.5 on admit - transfused 3 units-follow-up hemoglobin pending   Objective   Vital signs in last 24 hours: Temp:  [98.1 F (36.7 C)-99 F (37.2 C)] 99 F (37.2 C) (02/13 0803) Pulse Rate:  [83-93] 93 (02/13 0803) Resp:  [13-20] 18 (02/13 0803) BP: (124-166)/(49-74) 166/64 (02/13 0803) SpO2:  [91 %-97 %] 96 % (02/13 0803) Last BM Date: 04/06/21 General:   Elderly white male in NAD Heart:  Regular rate and rhythm; no murmurs Lungs: Respirations even and unlabored, lungs CTA bilaterally Abdomen:  Soft, nontender and nondistended. Normal bowel sounds. Extremities:  Without edema. Neurologic:  Alert and oriented,  grossly normal neurologically. Psych:  Cooperative.  Normal mood and affect.  Intake/Output from previous day: 02/12 0701 - 02/13 0700 In: 1065 [Blood:765; IV Piggyback:300] Out: -  Intake/Output this shift: Total I/O In: 448 [Blood:448] Out: -   Lab Results: Recent Labs    04/06/21 0227 04/06/21 0956 04/06/21 2206  WBC 12.4*  --  13.1*  HGB 8.4* 6.8* 6.6*  HCT 25.9* 22.8* 20.1*  PLT 417*  --  289   BMET Recent Labs    04/06/21 0227  NA 136  K 4.5  CL 100  CO2 20*  GLUCOSE 145*  BUN 60*  CREATININE 5.65*  CALCIUM 7.6*   LFT Recent Labs    04/06/21 0227  PROT 5.0*  ALBUMIN 1.8*  AST 12*  ALT 5  ALKPHOS 46  BILITOT 0.3   PT/INR Recent Labs    04/06/21 0227  LABPROT 38.9*  INR 4.0*    Studies/Results: CT ABDOMEN PELVIS WO CONTRAST  Result Date: 04/06/2021 CLINICAL DATA:  Abdominal pain. EXAM: CT ABDOMEN AND PELVIS WITHOUT CONTRAST TECHNIQUE: Multidetector CT imaging of the abdomen and pelvis was performed following the standard protocol without IV contrast. RADIATION DOSE REDUCTION: This exam was performed according to the departmental dose-optimization program which includes automated exposure control, adjustment of the mA and/or kV according to patient size and/or use of iterative reconstruction technique. COMPARISON:  CT abdomen pelvis dated 08/12/2020. FINDINGS: Evaluation of this exam is limited in the absence of intravenous contrast. Evaluation is also limited due  to streak artifact caused by patient's arms. Lower chest: Partially visualized small bilateral pleural effusions, left greater right with associated partial compressive atelectasis of the lower lobes versus pneumonia. Clinical correlation is recommended. Three vessel coronary vascular calcification and small pericardial effusion. Partially visualized central venous line with tip at the cavoatrial junction. No intra-abdominal free air or free fluid. Hepatobiliary: Ill-defined 2.5 cm hypodense lesion in the right lobe of the liver not characterized on  this CT. No intrahepatic biliary dilatation. Cholecystectomy. Pancreas: Atrophic pancreas with scattered coarse calcification suggestive of chronic pancreatitis. No active inflammatory changes. Spleen: Normal in size without focal abnormality. Adrenals/Urinary Tract: The adrenal glands are suboptimally visualized. There is no hydronephrosis or nephrolithiasis on either side. Bilateral renal cysts measure up to 4.5 cm in the upper pole of the right kidney. Additional smaller hypodense lesions are not characterized on this CT. The visualized ureters appear unremarkable. The urinary bladder is decompressed around a Foley catheter. Stomach/Bowel: Mild stranding surrounding the duodenal C-loop may represent duodenum this. Clinical correlation is recommended. There is sigmoid diverticulosis and scattered colonic diverticula without active inflammatory changes. No bowel obstruction. Vascular/Lymphatic: Advanced aortoiliac atherosclerotic disease. The IVC is unremarkable. No portal venous gas. There is no adenopathy. Reproductive: The prostate gland is poorly visualized. Other: Mild diffuse subcutaneous edema. Musculoskeletal: Osteopenia with degenerative changes of the spine. Lower lumbar posterior fusion hardware as well as right hip arthroplasty. No acute osseous pathology. IMPRESSION: 1. Mild stranding surrounding the duodenal C-loop may represent duodenum. No bowel obstruction. 2. Colonic diverticulosis. 3. No hydronephrosis or nephrolithiasis. 4. Partially visualized small bilateral pleural effusions, left greater right with associated partial compressive atelectasis of the lower lobes versus pneumonia. 5. Ill-defined 2.5 cm hypodense lesion in the right lobe of the liver not characterized on this CT. 6. Aortic Atherosclerosis (ICD10-I70.0). Electronically Signed   By: Anner Crete M.D.   On: 04/06/2021 03:16   DG Chest Portable 1 View  Result Date: 04/06/2021 CLINICAL DATA:  Difficulty breathing EXAM:  PORTABLE CHEST 1 VIEW COMPARISON:  03/27/2021 FINDINGS: Hazy opacities in the bilateral lower lungs, reflecting combination of small to moderate bilateral pleural effusions (right greater than left) and suspected lower lobe compressive atelectasis. No frank interstitial edema. The heart is normal in size.  Mild thoracic aortic atherosclerosis. Right IJ dialysis catheter terminates in the upper right atrium. IMPRESSION: Small to moderate bilateral pleural effusions (right greater than left) with suspected lower lobe compressive atelectasis. Electronically Signed   By: Julian Hy M.D.   On: 04/06/2021 02:23       Assessment / Plan:    #11 87 year old white male with acute upper GI bleed, presenting with melena, and hematemesis/coffee-ground emesis  Baseline hemoglobin 9.5/8.5 on admit then down to 6.6 earlier today Patient has been transfused 3 units of packed RBCs, follow-up hemoglobin pending  Rule out peptic ulcer disease.  #2 acute coagulopathy-INR 4.0 on admit-etiology not clear, patient is not on blood thinners, question secondary to underlying sepsis Status post vitamin K yesterday, INR pending today  #3 leukocytosis-on admit-probable pneumonia #4 end-stage renal disease-new start to dialysis and status post very recent dialysis catheter placement-had dialysis 04/02/2020 #5 history of CVA 6.  History of coronary artery disease 7.  Adult onset diabetes mellitus 8.  COPD 10.  History of renal cell CA 11.  History of chronic pancreatitis  Plan; follow-up INR, may need further vitamin K and/or FFP Continue to trend hemoglobin-transfuse for hemoglobin 7.5 or less Continue IV PPI infusion  We will keep  him n.p.o. after midnight, tentatively scheduled for EGD for tomorrow with Dr. Bryan Lemma.  Procedure was discussed in detail with the patient today including indications risks and benefits and he is agreeable to proceed.  Clear liquids today    Principal Problem:   Acute upper  GI bleed Active Problems:   COPD (chronic obstructive pulmonary disease) (HCC)   Acute blood loss anemia   Essential hypertension   Chronic pancreatitis (HCC)   ESRD on hemodialysis ([place])   Supratherapeutic INR   Community acquired pneumonia     LOS: 1 day   Amy Esterwood PA-C 04/07/2021, 10:38 AM

## 2021-04-07 NOTE — H&P (View-Only) (Signed)
Patient ID: Wayne Meadows, male   DOB: October 01, 1934, 87 y.o.   MRN: [id number]     Attending physician's note   I have reviewed the chart and discussed his care on rounds. I performed a substantive portion of this encounter, including complete performance of at least one of the key components, in conjunction with the APP. I agree with the APP's note, impression, and recommendations with my edits.   INR now 1.2.  Unclear why it was 4.0 on admission, but robust response to vitamin K.  Otherwise Hgb decreased to 6.6 earlier today, responsive to 3 unit PRBC transfusion with posttransfusion H/H 8.6/25.2.  Iron studies were normal in January.  Baseline Hgb ~9.5.  - Continue serial H/H checks with additional blood products as needed per protocol - Plan for EGD tomorrow for diagnostic and potentially therapeutic intent - Continue high-dose PPI - Clears okay today with n.p.o. at midnight - HD today per Nephrology  Wayne Heck, DO, FACG [phone] office          Progress Note   Subjective   Day # 2  CC; GI bleed- melena, hematemesis  Patient says he has not had any bowel movements or melena and no further vomiting or hematemesis   INR 4.0  on admit ( no thinners )- Pending today HGB down to 6.6 this am from 8.5 on admit - transfused 3 units-follow-up hemoglobin pending   Objective   Vital signs in last 24 hours: Temp:  [98.1 F (36.7 C)-99 F (37.2 C)] 99 F (37.2 C) (02/13 0803) Pulse Rate:  [83-93] 93 (02/13 0803) Resp:  [13-20] 18 (02/13 0803) BP: (124-166)/(49-74) 166/64 (02/13 0803) SpO2:  [91 %-97 %] 96 % (02/13 0803) Last BM Date: 04/06/21 General:   Elderly white male in NAD Heart:  Regular rate and rhythm; no murmurs Lungs: Respirations even and unlabored, lungs CTA bilaterally Abdomen:  Soft, nontender and nondistended. Normal bowel sounds. Extremities:  Without edema. Neurologic:  Alert and oriented,  grossly normal neurologically. Psych:  Cooperative.  Normal mood and affect.  Intake/Output from previous day: 02/12 0701 - 02/13 0700 In: 1065 [Blood:765; IV Piggyback:300] Out: -  Intake/Output this shift: Total I/O In: 448 [Blood:448] Out: -   Lab Results: Recent Labs    04/06/21 0227 04/06/21 0956 04/06/21 2206  WBC 12.4*  --  13.1*  HGB 8.4* 6.8* 6.6*  HCT 25.9* 22.8* 20.1*  PLT 417*  --  289   BMET Recent Labs    04/06/21 0227  NA 136  K 4.5  CL 100  CO2 20*  GLUCOSE 145*  BUN 60*  CREATININE 5.65*  CALCIUM 7.6*   LFT Recent Labs    04/06/21 0227  PROT 5.0*  ALBUMIN 1.8*  AST 12*  ALT 5  ALKPHOS 46  BILITOT 0.3   PT/INR Recent Labs    04/06/21 0227  LABPROT 38.9*  INR 4.0*    Studies/Results: CT ABDOMEN PELVIS WO CONTRAST  Result Date: 04/06/2021 CLINICAL DATA:  Abdominal pain. EXAM: CT ABDOMEN AND PELVIS WITHOUT CONTRAST TECHNIQUE: Multidetector CT imaging of the abdomen and pelvis was performed following the standard protocol without IV contrast. RADIATION DOSE REDUCTION: This exam was performed according to the departmental dose-optimization program which includes automated exposure control, adjustment of the mA and/or kV according to patient size and/or use of iterative reconstruction technique. COMPARISON:  CT abdomen pelvis dated 08/12/2020. FINDINGS: Evaluation of this exam is limited in the absence of intravenous contrast. Evaluation is also limited due  to streak artifact caused by patient's arms. Lower chest: Partially visualized small bilateral pleural effusions, left greater right with associated partial compressive atelectasis of the lower lobes versus pneumonia. Clinical correlation is recommended. Three vessel coronary vascular calcification and small pericardial effusion. Partially visualized central venous line with tip at the cavoatrial junction. No intra-abdominal free air or free fluid. Hepatobiliary: Ill-defined 2.5 cm hypodense lesion in the right lobe of the liver not characterized on  this CT. No intrahepatic biliary dilatation. Cholecystectomy. Pancreas: Atrophic pancreas with scattered coarse calcification suggestive of chronic pancreatitis. No active inflammatory changes. Spleen: Normal in size without focal abnormality. Adrenals/Urinary Tract: The adrenal glands are suboptimally visualized. There is no hydronephrosis or nephrolithiasis on either side. Bilateral renal cysts measure up to 4.5 cm in the upper pole of the right kidney. Additional smaller hypodense lesions are not characterized on this CT. The visualized ureters appear unremarkable. The urinary bladder is decompressed around a Foley catheter. Stomach/Bowel: Mild stranding surrounding the duodenal C-loop may represent duodenum this. Clinical correlation is recommended. There is sigmoid diverticulosis and scattered colonic diverticula without active inflammatory changes. No bowel obstruction. Vascular/Lymphatic: Advanced aortoiliac atherosclerotic disease. The IVC is unremarkable. No portal venous gas. There is no adenopathy. Reproductive: The prostate gland is poorly visualized. Other: Mild diffuse subcutaneous edema. Musculoskeletal: Osteopenia with degenerative changes of the spine. Lower lumbar posterior fusion hardware as well as right hip arthroplasty. No acute osseous pathology. IMPRESSION: 1. Mild stranding surrounding the duodenal C-loop may represent duodenum. No bowel obstruction. 2. Colonic diverticulosis. 3. No hydronephrosis or nephrolithiasis. 4. Partially visualized small bilateral pleural effusions, left greater right with associated partial compressive atelectasis of the lower lobes versus pneumonia. 5. Ill-defined 2.5 cm hypodense lesion in the right lobe of the liver not characterized on this CT. 6. Aortic Atherosclerosis (ICD10-I70.0). Electronically Signed   By: Anner Crete M.D.   On: 04/06/2021 03:16   DG Chest Portable 1 View  Result Date: 04/06/2021 CLINICAL DATA:  Difficulty breathing EXAM:  PORTABLE CHEST 1 VIEW COMPARISON:  03/27/2021 FINDINGS: Hazy opacities in the bilateral lower lungs, reflecting combination of small to moderate bilateral pleural effusions (right greater than left) and suspected lower lobe compressive atelectasis. No frank interstitial edema. The heart is normal in size.  Mild thoracic aortic atherosclerosis. Right IJ dialysis catheter terminates in the upper right atrium. IMPRESSION: Small to moderate bilateral pleural effusions (right greater than left) with suspected lower lobe compressive atelectasis. Electronically Signed   By: Julian Hy M.D.   On: 04/06/2021 02:23       Assessment / Plan:    #36 87 year old white male with acute upper GI bleed, presenting with melena, and hematemesis/coffee-ground emesis  Baseline hemoglobin 9.5/8.5 on admit then down to 6.6 earlier today Patient has been transfused 3 units of packed RBCs, follow-up hemoglobin pending  Rule out peptic ulcer disease.  #2 acute coagulopathy-INR 4.0 on admit-etiology not clear, patient is not on blood thinners, question secondary to underlying sepsis Status post vitamin K yesterday, INR pending today  #3 leukocytosis-on admit-probable pneumonia #4 end-stage renal disease-new start to dialysis and status post very recent dialysis catheter placement-had dialysis 04/02/2020 #5 history of CVA 6.  History of coronary artery disease 7.  Adult onset diabetes mellitus 8.  COPD 10.  History of renal cell CA 11.  History of chronic pancreatitis  Plan; follow-up INR, may need further vitamin K and/or FFP Continue to trend hemoglobin-transfuse for hemoglobin 7.5 or less Continue IV PPI infusion  We will keep  him n.p.o. after midnight, tentatively scheduled for EGD for tomorrow with Dr. Bryan Lemma.  Procedure was discussed in detail with the patient today including indications risks and benefits and he is agreeable to proceed.  Clear liquids today    Principal Problem:   Acute upper  GI bleed Active Problems:   COPD (chronic obstructive pulmonary disease) (HCC)   Acute blood loss anemia   Essential hypertension   Chronic pancreatitis (HCC)   ESRD on hemodialysis ([place])   Supratherapeutic INR   Community acquired pneumonia     LOS: 1 day   Wayne Esterwood PA-C 04/07/2021, 10:38 AM

## 2021-04-07 NOTE — Consult Note (Signed)
Wayne Meadows Renal Consultation Note    Indication for Consultation:  Management of ESRD/hemodialysis; anemia, hypertension/volume and secondary hyperparathyroidism  ZOX:WRUEAV, Wayne Meadows  HPI: Wayne Meadows is a 87 y.o. male with ESRD 2/2 cardiorenal syndrome on HD MWF at [facility].  New start to dialysis just starting HD on 04/01/21.  Past medical history significant for CVA and CAD, diabetes mellitus, COPD, hypertension, COPD, chronic pancreatitis and Hx renal carcinoma.  Recent admission 1/23-2/10 due to AKI on CKD >ESRD starting dialysis.   Patient seen and examined at bedside.  Presented to the ED due to abdominal pain, nausea and vomiting x 1 week. Symptoms acutely worsened over the weekend.  Admits to bright red blood was well as coffee ground emesis and dark stool starting Saturday.  Additional symptoms included dyspnea with exertion.  Denies CP, fever, chills, cough, dizziness and fatigue.    Pertinent findings in work up include Hgb 6.6, +FOBT, CXR with small-moderate b/l pleural effusions, CT abdomen with no obstruction, b/l pneumonia and small pleural effusions.  Patient has been admitted for further evaluation and management.   Past Medical History:  Diagnosis Date   Arthritis    Carotid stenosis    COPD (chronic obstructive pulmonary disease) ([facility])    Coronary artery disease    S/p PCI 2011;  NSTEMI 12/12:  LHC/PCI 02/23/11: LAD 60% after the septal perforator, D1 occluded with distal collaterals, proximal RI 30-40%, AV circumflex stent patent with 60% stenosis after the stent, RCA 99%, EF 60-65%.  His RCA was treated with a bare-metal stent   CVA (cerebral infarction) 2011   Right cerebral; total obstruction of the right ICA   Diverticulitis    Hypertension    Rectal bleeding 07/2015   Renal carcinoma (HCC)    Stroke (HCC)    Tobacco abuse, in remission    Past Surgical History:  Procedure Laterality Date   AV FISTULA PLACEMENT Right 04/03/2021    Procedure: ARTERIOVENOUS (AV) CREATION OF RIGHT ARM BRACHIOCEPHALIC FISTULA;  Surgeon: Cherre Robins, MD;  Location: [facility];  Service: Vascular;  Laterality: Right;   COLON SURGERY     COLONOSCOPY N/A 08/22/2015   Procedure: COLONOSCOPY;  Surgeon: [name], MD;  Location: [facility] ENDOSCOPY;  Service: Endoscopy;  Laterality: N/A;   ESOPHAGOGASTRODUODENOSCOPY N/A 07/18/2020   Procedure: ESOPHAGOGASTRODUODENOSCOPY (EGD);  Surgeon: Milus Banister, MD;  Location: [facility];  Service: Endoscopy;  Laterality: N/A;   ESOPHAGOGASTRODUODENOSCOPY (EGD) WITH PROPOFOL N/A 06/21/2020   Procedure: ESOPHAGOGASTRODUODENOSCOPY (EGD) WITH PROPOFOL;  Surgeon: Irene Shipper, MD;  Location: [facility] ENDOSCOPY;  Service: Endoscopy;  Laterality: N/A;   EUS N/A 07/18/2020   Procedure: UPPER ENDOSCOPIC ULTRASOUND (EUS) RADIAL;  Surgeon: Milus Banister, MD;  Location: [facility] ENDOSCOPY;  Service: Endoscopy;  Laterality: N/A;   FINE NEEDLE ASPIRATION N/A 07/18/2020   Procedure: FINE NEEDLE ASPIRATION (FNA) LINEAR;  Surgeon: Milus Banister, MD;  Location: [facility] ENDOSCOPY;  Service: Endoscopy;  Laterality: N/A;   hip relacement     IR FLUORO GUIDE CV LINE RIGHT  03/31/2021   IR US GUIDE VASC ACCESS RIGHT  03/31/2021   KIDNEY SURGERY     LEFT HEART CATH AND CORONARY ANGIOGRAPHY N/A 07/09/2020   Procedure: LEFT HEART CATH AND CORONARY ANGIOGRAPHY;  Surgeon: Leonie Man, MD;  Location: [facility] CV LAB;  Service: Cardiovascular;  Laterality: N/A;   LEFT HEART CATHETERIZATION WITH CORONARY ANGIOGRAM N/A 02/23/2011   Procedure: LEFT HEART CATHETERIZATION WITH CORONARY ANGIOGRAM;  Surgeon: [name],  MD;  Location: [facility] CATH LAB;  Service: Cardiovascular;  Laterality: N/A;   LEFT HEART CATHETERIZATION WITH CORONARY ANGIOGRAM N/A 03/18/2011   Procedure: LEFT HEART CATHETERIZATION WITH CORONARY ANGIOGRAM;  Surgeon: [name], MD;  Location: [facility] CATH LAB;  Service: Cardiovascular;  Laterality: N/A;   PERCUTANEOUS CORONARY  STENT INTERVENTION (PCI-S) N/A 02/23/2011   Procedure: PERCUTANEOUS CORONARY STENT INTERVENTION (PCI-S);  Surgeon: [name], MD;  Location: [facility] CATH LAB;  Service: Cardiovascular;  Laterality: N/A;   TEMPORARY PACEMAKER INSERTION N/A 02/23/2011   Procedure: TEMPORARY PACEMAKER INSERTION;  Surgeon: [name], MD;  Location: [facility] CATH LAB;  Service: Cardiovascular;  Laterality: N/A;   THROMBECTOMY W/ EMBOLECTOMY Right 04/03/2021   Procedure: THROMBECTOMY ARTERIOVENOUS FISTULA;  Surgeon: Cherre Robins, MD;  Location: [facility] OR;  Service: Vascular;  Laterality: Right;   Family History  Problem Relation Age of Onset   Heart attack Other 69   Social History:  reports that he quit smoking about 14 years ago. His smoking use included cigarettes. He has never used smokeless tobacco. He reports that he does not currently use alcohol after a past usage of about 1.0 standard drink per week. He reports that he does not use drugs. Allergies  Allergen Reactions   Bee Venom Anaphylaxis    Has epi pen   Influenza Vaccines Other (See Comments)    "Mortally sick for 2 weeks"   Prior to Admission medications   Medication Sig Start Date End Date Taking? Authorizing Provider  albuterol (VENTOLIN HFA) 108 (90 Base) MCG/ACT inhaler Inhale 1 puff into the lungs every 6 (six) hours as needed for shortness of breath.   Yes [provider]  hydrALAZINE (APRESOLINE) 10 MG tablet Take 1 tablet (10 mg total) by mouth every 8 (eight) hours as needed (for SBP greater than 180). 04/04/21  Yes Little Ishikawa, MD  Ipratropium-Albuterol (COMBIVENT) 20-100 MCG/ACT AERS respimat Inhale 1 puff into the lungs 4 (four) times daily.   Yes [provider]  lanthanum (FOSRENOL) 1000 MG chewable tablet Chew 1 tablet (1,000 mg total) by mouth 3 (three) times daily with meals. 04/04/21  Yes Little Ishikawa, MD  pravastatin (PRAVACHOL) 40 MG tablet Take 40 mg by mouth at bedtime.   Yes [provider]  amLODipine (NORVASC) 10 MG tablet Take 1 tablet (10 mg total) by mouth daily. Patient not taking: Reported on 04/06/2021 07/11/20   Margie Billet, NP  aspirin EC 81 MG tablet Take 81 mg by mouth daily. Patient not taking: Reported on 04/06/2021    [provider]  cloNIDine (CATAPRES) 0.1 MG tablet Take 1 tablet (0.1 mg total) by mouth daily. Patient not taking: Reported on 04/06/2021 06/24/20   Harvie Heck, MD  ferrous sulfate 325 (65 FE) MG tablet Take 1 tablet by mouth. Every Monday, Wednesday, and Friday Patient not taking: Reported on 04/06/2021 07/30/20   [provider]  HYDROcodone-acetaminophen (NORCO) 10-325 MG tablet Take 1 tablet by mouth every 4 (four) hours as needed for up to 5 days for severe pain. Patient not taking: Reported on 04/06/2021 04/04/21 04/09/21  Little Ishikawa, MD  levothyroxine (SYNTHROID) 50 MCG tablet Take 50 mcg by mouth daily before breakfast. Patient not taking: Reported on 04/06/2021    [provider]  metoprolol tartrate (LOPRESSOR) 25 MG tablet Take 25 mg by mouth daily. Patient not taking: Reported on 04/06/2021    [provider]  Multiple Vitamin (MULTIVITAMIN) tablet Take 1 tablet by mouth daily.  Patient not taking: Reported on 04/06/2021    [provider]  polyethylene glycol (MIRALAX / GLYCOLAX) 17 g packet Take 17 g by mouth daily as needed for mild constipation or moderate constipation. Patient not taking: Reported on 04/06/2021 08/15/20   [name], MD   Current Facility-Administered Medications  Medication Dose Route Frequency Provider Last Rate Last Admin   0.9 %  sodium chloride infusion (Manually program via Guardrails IV Fluids)   Intravenous Once Little Ishikawa, MD       0.9 %  sodium chloride infusion (Manually program via Guardrails IV Fluids)   Intravenous Once Lovey Newcomer T, NP       acetaminophen (TYLENOL) tablet 650 mg  650 mg Oral Q6H PRN Chotiner, [name], MD       Or    acetaminophen (TYLENOL) suppository 650 mg  650 mg Rectal Q6H PRN Chotiner, [name], MD       azithromycin (ZITHROMAX) 500 mg in sodium chloride 0.9 % 250 mL IVPB  500 mg Intravenous Q24H Chotiner, [name], MD   Stopped at 04/06/21 2129   cefTRIAXone (ROCEPHIN) 2 g in sodium chloride 0.9 % 100 mL IVPB  2 g Intravenous Q24H Chotiner, [name], MD   Stopped at 04/06/21 1936   Chlorhexidine Gluconate Cloth 2 % PADS 6 each  6 each Topical Daily Chotiner, [name], MD       Chlorhexidine Gluconate Cloth 2 % PADS 6 each  6 each Topical Q0600 Meiya Wisler, Ria Comment, PA       hydrALAZINE (APRESOLINE) tablet 10 mg  10 mg Oral Q8H PRN Chotiner, [name], MD       HYDROmorphone (DILAUDID) injection 1 mg  1 mg Intravenous Q3H PRN Chotiner, [name], MD   1 mg at 04/06/21 1845   ipratropium-albuterol (DUONEB) 0.5-2.5 (3) MG/3ML nebulizer solution 3 mL  3 mL Nebulization Q6H PRN Chotiner, [name], MD       lactated ringers infusion   Intravenous Continuous Chotiner, [name], MD   Paused at 04/07/21 0045   octreotide (SANDOSTATIN) 500 mcg in sodium chloride 0.9 % 250 mL (2 mcg/mL) infusion  50 mcg/hr Intravenous Continuous Chotiner, [name], MD 25 mL/hr at 04/06/21 2337 50 mcg/hr at 04/06/21 2337   pantoprozole (PROTONIX) 80 mg /NS 100 mL infusion  8 mg/hr Intravenous Continuous Chotiner, [name], MD   Paused at 04/07/21 0045   pravastatin (PRAVACHOL) tablet 40 mg  40 mg Oral QHS Chotiner, [name], MD       prochlorperazine (COMPAZINE) injection 10 mg  10 mg Intravenous Q6H PRN Chotiner, [name], MD       Labs: Basic Metabolic Panel: Recent Labs  Lab 04/02/21 0305 04/03/21 0104 04/04/21 0349 04/06/21 0227  NA 136 139 137 136  K 5.4* 4.0 3.7 4.5  CL 103 103 101 100  CO2 17* 21* 23 20*  GLUCOSE 71 77 66* 145*  BUN 33* 40* 20 60*  CREATININE 4.33* 5.29* 3.49* 5.65*  CALCIUM 7.9* 8.0* 8.0* 7.6*  PHOS 4.7* 5.8* 4.4  --    Liver Function Tests: Recent Labs  Lab 04/03/21 0104  04/04/21 0349 04/06/21 0227  AST  --   --  12*  ALT  --   --  5  ALKPHOS  --   --  46  BILITOT  --   --  0.3  PROT  --   --  5.0*  ALBUMIN 2.1* 2.1* 1.8*   Recent Labs  Lab 04/06/21  3220  LIPASE 65*    CBC: Recent Labs  Lab 04/01/21 0857 04/06/21 0227 04/06/21 0956 04/06/21 2206  WBC 8.1 12.4*  --  13.1*  NEUTROABS  --  10.9*  --   --   HGB 9.6* 8.4* 6.8* 6.6*  HCT 30.8* 25.9* 22.8* 20.1*  MCV 93.1 92.8  --  86.6  PLT 275 417*  --  289   Studies/Results: CT ABDOMEN PELVIS WO CONTRAST  Result Date: 04/06/2021 CLINICAL DATA:  Abdominal pain. EXAM: CT ABDOMEN AND PELVIS WITHOUT CONTRAST TECHNIQUE: Multidetector CT imaging of the abdomen and pelvis was performed following the standard protocol without IV contrast. RADIATION DOSE REDUCTION: This exam was performed according to the departmental dose-optimization program which includes automated exposure control, adjustment of the mA and/or kV according to patient size and/or use of iterative reconstruction technique. COMPARISON:  CT abdomen pelvis dated 08/12/2020. FINDINGS: Evaluation of this exam is limited in the absence of intravenous contrast. Evaluation is also limited due to streak artifact caused by patient's arms. Lower chest: Partially visualized small bilateral pleural effusions, left greater right with associated partial compressive atelectasis of the lower lobes versus pneumonia. Clinical correlation is recommended. Three vessel coronary vascular calcification and small pericardial effusion. Partially visualized central venous line with tip at the cavoatrial junction. No intra-abdominal free air or free fluid. Hepatobiliary: Ill-defined 2.5 cm hypodense lesion in the right lobe of the liver not characterized on this CT. No intrahepatic biliary dilatation. Cholecystectomy. Pancreas: Atrophic pancreas with scattered coarse calcification suggestive of chronic pancreatitis. No active inflammatory changes. Spleen: Normal in size  without focal abnormality. Adrenals/Urinary Tract: The adrenal glands are suboptimally visualized. There is no hydronephrosis or nephrolithiasis on either side. Bilateral renal cysts measure up to 4.5 cm in the upper pole of the right kidney. Additional smaller hypodense lesions are not characterized on this CT. The visualized ureters appear unremarkable. The urinary bladder is decompressed around a Foley catheter. Stomach/Bowel: Mild stranding surrounding the duodenal C-loop may represent duodenum this. Clinical correlation is recommended. There is sigmoid diverticulosis and scattered colonic diverticula without active inflammatory changes. No bowel obstruction. Vascular/Lymphatic: Advanced aortoiliac atherosclerotic disease. The IVC is unremarkable. No portal venous gas. There is no adenopathy. Reproductive: The prostate gland is poorly visualized. Other: Mild diffuse subcutaneous edema. Musculoskeletal: Osteopenia with degenerative changes of the spine. Lower lumbar posterior fusion hardware as well as right hip arthroplasty. No acute osseous pathology. IMPRESSION: 1. Mild stranding surrounding the duodenal C-loop may represent duodenum. No bowel obstruction. 2. Colonic diverticulosis. 3. No hydronephrosis or nephrolithiasis. 4. Partially visualized small bilateral pleural effusions, left greater right with associated partial compressive atelectasis of the lower lobes versus pneumonia. 5. Ill-defined 2.5 cm hypodense lesion in the right lobe of the liver not characterized on this CT. 6. Aortic Atherosclerosis (ICD10-I70.0). Electronically Signed   By: Anner Crete M.D.   On: 04/06/2021 03:16   DG Chest Portable 1 View  Result Date: 04/06/2021 CLINICAL DATA:  Difficulty breathing EXAM: PORTABLE CHEST 1 VIEW COMPARISON:  03/27/2021 FINDINGS: Hazy opacities in the bilateral lower lungs, reflecting combination of small to moderate bilateral pleural effusions (right greater than left) and suspected lower lobe  compressive atelectasis. No frank interstitial edema. The heart is normal in size.  Mild thoracic aortic atherosclerosis. Right IJ dialysis catheter terminates in the upper right atrium. IMPRESSION: Small to moderate bilateral pleural effusions (right greater than left) with suspected lower lobe compressive atelectasis. Electronically Signed   By: Julian Hy M.D.   On: 04/06/2021  02:23    ROS: All others negative except those listed in HPI.  Physical Exam: Vitals:   04/07/21 0330 04/07/21 0345 04/07/21 0445 04/07/21 0803  BP:  (!) 154/58 (!) 155/72 (!) 166/64  Pulse:   91 93  Resp:   19 18  Temp: 98.6 F (37 C)  98.5 F (36.9 C) 99 F (37.2 C)  TempSrc: Oral  Oral Oral  SpO2:   97% 96%  Weight:      Height:         General: chronically ill appearing male in NAD Head: NCAT sclera not icteric MMM Neck: Supple. No lymphadenopathy Lungs: CTA bilaterally. No wheeze, rales or rhonchi. Breathing is unlabored on 2L via [place] Heart: RRR. No murmur, rubs or gallops.  Abdomen: soft, nontender, +BS, no guarding, no rebound tenderness Extremities:no edema in b/l LE, UE edema R>L Neuro: AAOx3. Moves all extremities spontaneously. Psych:  Responds to questions appropriately with a normal affect. Dialysis Access: TDC, RU AVF maturing   Dialysis Orders:  MWF  - [facility]  3>3.5>4hrs, BFR 400, DFR 800  EDW 87kg, 2K/ 2.5Ca  Access: TDC, AVF maturing  Heparin 3000 unit bolus Mircera 50 mcg q2wks - not given  Assessment/Plan:  Acute GIB - Hgb drop 8.4>6.6 s/p 3 units pRBC.  Plan for EGD once INR corrected.  GI following.  Leukocytosis - PNA noted on CXR/CT.  ABX started - azithromax.   ESRD -  on MWF, new start at [facility] last admit.  No outpatient HD yet.  Orders written for today.  Last HD 2/8. No heparin.   Hypertension/volume  - BP elevated.  Working to establish EDW, continue UF as tolerated.   ABLA on chronic anemia of CKD - due to #1. As above.  Continue ESA.   Secondary Hyperparathyroidism -   Calcium low, use increased Ca bath.  Check phos. Not on VDRA.  Nutrition - Renal diet w/fluid restrictions.  Hx CAD DMT2 - per PMD Hx CVA  Jen Mow, PA-C [facility] 04/07/2021, 9:26 AM

## 2021-04-08 ENCOUNTER — Encounter (HOSPITAL_COMMUNITY): Payer: Self-pay | Admitting: Family Medicine

## 2021-04-08 ENCOUNTER — Inpatient Hospital Stay (HOSPITAL_COMMUNITY): Payer: No Typology Code available for payment source | Admitting: Anesthesiology

## 2021-04-08 ENCOUNTER — Encounter (HOSPITAL_COMMUNITY)
Admission: EM | Disposition: A | Discharge status: Skilled Nursing Facility | Payer: Self-pay | Source: Skilled Nursing Facility | Attending: Internal Medicine

## 2021-04-08 DIAGNOSIS — K259 Gastric ulcer, unspecified as acute or chronic, without hemorrhage or perforation: Secondary | ICD-10-CM

## 2021-04-08 DIAGNOSIS — D62 Acute posthemorrhagic anemia: Secondary | ICD-10-CM

## 2021-04-08 DIAGNOSIS — K2289 Other specified disease of esophagus: Secondary | ICD-10-CM

## 2021-04-08 DIAGNOSIS — K274 Chronic or unspecified peptic ulcer, site unspecified, with hemorrhage: Secondary | ICD-10-CM

## 2021-04-08 DIAGNOSIS — K269 Duodenal ulcer, unspecified as acute or chronic, without hemorrhage or perforation: Secondary | ICD-10-CM

## 2021-04-08 DIAGNOSIS — E039 Hypothyroidism, unspecified: Secondary | ICD-10-CM

## 2021-04-08 HISTORY — PX: HEMOSTASIS CLIP PLACEMENT: SHX6857

## 2021-04-08 HISTORY — PX: ESOPHAGOGASTRODUODENOSCOPY (EGD) WITH PROPOFOL: SHX5813

## 2021-04-08 HISTORY — PX: BIOPSY: SHX5522

## 2021-04-08 LAB — TYPE AND SCREEN
ABO/RH(D): O POS
Antibody Screen: NEGATIVE
Unit division: 0
Unit division: 0
Unit division: 0

## 2021-04-08 LAB — BPAM RBC
Blood Product Expiration Date: 202303032359
Blood Product Expiration Date: 202303132359
Blood Product Expiration Date: 202303132359
ISSUE DATE / TIME: 202302121252
ISSUE DATE / TIME: 202302130036
ISSUE DATE / TIME: 202302130338
Unit Type and Rh: 5100
Unit Type and Rh: 5100
Unit Type and Rh: 5100

## 2021-04-08 LAB — CBC
HCT: 24.1 % — ABNORMAL LOW (ref 39.0–52.0)
Hemoglobin: 8.2 g/dL — ABNORMAL LOW (ref 13.0–17.0)
MCH: 29.5 pg (ref 26.0–34.0)
MCHC: 34 g/dL (ref 30.0–36.0)
MCV: 86.7 fL (ref 80.0–100.0)
Platelets: 235 10*3/uL (ref 150–400)
RBC: 2.78 MIL/uL — ABNORMAL LOW (ref 4.22–5.81)
RDW: 16.9 % — ABNORMAL HIGH (ref 11.5–15.5)
WBC: 10.9 10*3/uL — ABNORMAL HIGH (ref 4.0–10.5)
nRBC: 0 % (ref 0.0–0.2)

## 2021-04-08 LAB — BASIC METABOLIC PANEL
Anion gap: 15 (ref 5–15)
BUN: 42 mg/dL — ABNORMAL HIGH (ref 8–23)
CO2: 19 mmol/L — ABNORMAL LOW (ref 22–32)
Calcium: 8 mg/dL — ABNORMAL LOW (ref 8.9–10.3)
Chloride: 103 mmol/L (ref 98–111)
Creatinine, Ser: 4.68 mg/dL — ABNORMAL HIGH (ref 0.61–1.24)
GFR, Estimated: 12 mL/min — ABNORMAL LOW (ref >60)
Glucose, Bld: 110 mg/dL — ABNORMAL HIGH (ref 70–99)
Potassium: 3.7 mmol/L (ref 3.5–5.1)
Sodium: 137 mmol/L (ref 135–145)

## 2021-04-08 LAB — PROTIME-INR
INR: 1.3 — ABNORMAL HIGH (ref 0.8–1.2)
Prothrombin Time: 15.8 seconds — ABNORMAL HIGH (ref 11.4–15.2)

## 2021-04-08 SURGERY — ESOPHAGOGASTRODUODENOSCOPY (EGD) WITH PROPOFOL
Anesthesia: Monitor Anesthesia Care

## 2021-04-08 MED ORDER — DILTIAZEM HCL ER 60 MG PO CP12
60.0000 mg | ORAL_CAPSULE | Freq: Two times a day (BID) | ORAL | Status: AC
  Administered 2021-04-08 – 2021-04-10 (×6): 60 mg via ORAL
  Filled 2021-04-08 (×6): qty 1

## 2021-04-08 MED ORDER — SODIUM CHLORIDE 0.9 % IV SOLN
INTRAVENOUS | Status: AC | PRN
  Administered 2021-04-08: 500 mL via INTRAVENOUS

## 2021-04-08 MED ORDER — SODIUM CHLORIDE 0.9 % IV SOLN: INTRAVENOUS | Status: DC | PRN

## 2021-04-08 MED ORDER — PROPOFOL 500 MG/50ML IV EMUL
INTRAVENOUS | Status: DC | PRN
  Administered 2021-04-08: 125 ug/kg/min via INTRAVENOUS

## 2021-04-08 MED ORDER — PROPOFOL 10 MG/ML IV BOLUS
INTRAVENOUS | Status: DC | PRN
Start: 2021-04-08 — End: 2021-04-08
  Administered 2021-04-08: 20 mg via INTRAVENOUS

## 2021-04-08 MED ORDER — DILTIAZEM HCL-DEXTROSE 125-5 MG/125ML-% IV SOLN (PREMIX)
5.0000 mg/h | INTRAVENOUS | Status: DC
  Administered 2021-04-08: 15 mg/h via INTRAVENOUS
  Administered 2021-04-08: 5 mg/h via INTRAVENOUS
  Administered 2021-04-08: 10 mg/h via INTRAVENOUS
  Filled 2021-04-08: qty 125

## 2021-04-08 MED ORDER — PANTOPRAZOLE SODIUM 40 MG PO TBEC
40.0000 mg | DELAYED_RELEASE_TABLET | Freq: Two times a day (BID) | ORAL | Status: DC
  Administered 2021-04-08 – 2021-04-11 (×7): 40 mg via ORAL
  Filled 2021-04-08 (×7): qty 1

## 2021-04-08 MED ORDER — METOPROLOL TARTRATE 5 MG/5ML IV SOLN
5.0000 mg | Freq: Once | INTRAVENOUS | Status: AC
  Administered 2021-04-08: 5 mg via INTRAVENOUS
  Filled 2021-04-08 (×2): qty 5

## 2021-04-08 SURGICAL SUPPLY — 15 items

## 2021-04-08 NOTE — Progress Notes (Signed)
PROGRESS NOTE    Wayne Meadows  MGQ:[id number] DOB: 18-Jun-1934 DOA: 04/06/2021 PCP: [name]   Brief Narrative:  Wayne Meadows is a 87 y.o. male with history of chronic kidney disease stage IIIb, CVA and CAD, diabetes mellitus, COPD, hypertension was admitted twice in the recent past - once for cellulitis of the lower extremity, discharged on antibiotics, second for worsening renal function and transitioned to HD due to acute renal failure. Discharged 2/10 and stable for PT, tolerating dialysis well. Patient presents back to hospital again from SNF for episode of coffee ground emesis overnight. GI consulted for follow up.   2/6 Status post right IJ tunneled HD catheter placed by IR 2/7 Tolerated first HD 2/8 Complaining of nausea, resolved with treatment 2/9 RUE Brachiocephalic AV fistula placement 2/10 - DISCHARGED TO REHAB 2/11 - READMITTED DUE TO COFFEE GROUND EMESIS AND WORSENING ANEMIA 2/12-13 - Continues to be anemic - EGD pending INR correction 2/14 - EGD shows nonbleeding ulcer, patient complaining of worsening RUE pain at new fistula sight - vascular asked to evaluate; post EGD A-fib with RVR improving with diltiazem drip  Assessment & Plan:   Principal Problem:   Acute upper GI bleed Active Problems:   Acute blood loss anemia   Atrial fibrillation (HCC)   COPD (chronic obstructive pulmonary disease) ([place])   Essential hypertension   Chronic pancreatitis (HCC)   ESRD (end stage renal disease) on dialysis ([place])   Supratherapeutic INR   Community acquired pneumonia   Elevated INR   Gastric ulcer without hemorrhage or perforation   Duodenal ulcer  Acute on chronic anemia -likely acute upper GI bleed - GI following - defer to their expertise for further imaging/endoscopy - INR inappropriately elevated at intake corrected to 1.2 today with vitamin K - EGD pending INR correction per GI signed out - Octreotide ongoing   Acute on chronic paroxysmal  A-fib with RVR, provoked  -Heart rate elevated today post procedurally in the setting of pain, symptomatic anemia and procedure -Resolving with diltiazem drip transition to additional diltiazem p.o. twice daily along with patient's chronic metoprolol -History of paroxysmal A-fib, not on anticoagulation likely due to burden per discussion at bedside today; continue rate control medications, outpatient follow-up with cardiology as indicated  Questionable HCAP ruled out - leukemoid reaction Patient does not meet sepsis criteria -discontinue antibiotics given negative procalcitonin and has no overt symptoms, leukocytosis likely reactive given above symptomatic anemia; continue to follow clinically  Recently noted ESRD with non-anion gap metabolic acidosis, resolving: Recently discharged with new IJ catheter for ongoing dialysis Right IJ catheter 2/6 - RUE brachiocephalic fistula 2/9  Plavix on hold in the setting of GI bleed   Gross bilateral lower extremity edema anasarca/venous insufficiency.   Secondary to above Improving with fluid management with dialysis   CAD, chronic History of coronary stent placement. Continue aspirin; Holding Plavix for primary dialysis access placement by vascular    Essential hypertension  Continue clonidine, amlodipine, metoprolol   Hypothyroidism, chronic Continue Synthroid   COPD, chronic  Compensated at this time.  Continue bronchodilators.   Chronic anemia, of chronic disease, stable Appears to be at baseline around 9-10.   Debility, deconditioning Ambulatory dysfunction, chronic Uses a walker and cane at home  - recent DC to SNF - plan to return post workup as above  DVT prophylaxis: None given above Code Status: Full Family Communication: None present  Status is: Inpatient  Dispo: The patient is from: Facility  Anticipated d/c is to: Same              Anticipated d/c date is: 48 to 72 hours pending clinical course               Patient currently not medically stable for discharge  Consultants:  GI, nephrology  Procedures:  Pending above  Antimicrobials:  None  Subjective: No acute issues or events overnight, right upper extremity fistula continues to be painful, post EGD patient had notable A-fib with RVR into the 150s to 160s improved with diltiazem drip, patient remained asymptomatic during this episode, heart rate downtrending appropriately transition to p.o. diltiazem this afternoon and off drip if tolerated.  Objective: Vitals:   04/08/21 1339 04/08/21 1353 04/08/21 1403 04/08/21 1433  BP: 122/83 (!) 142/90 (!) 135/102   Pulse: (!) 128 (!) 114 (!) 238   Resp: 16 (!) 27 16 13   Temp:   98.4 F (36.9 C)   TempSrc:      SpO2: 95% 98%    Weight:      Height:        Intake/Output Summary (Last 24 hours) at 04/08/2021 1542 Last data filed at 04/08/2021 1349 Gross per 24 hour  Intake 1993.4 ml  Output 750 ml  Net 1243.4 ml   Filed Weights   04/06/21 0155 04/07/21 1218 04/07/21 1528  Weight: 89.7 kg 90.3 kg 88.3 kg    Examination:  General:  Pleasantly resting in bed, No acute distress. HEENT:  Normocephalic atraumatic.  Sclerae nonicteric, noninjected.  Extraocular movements intact bilaterally. Neck:  Without mass or deformity.  Trachea is midline. Lungs:  Clear to auscultate bilaterally without rhonchi, wheeze, or rales. Heart: Irregularly irregular, tachycardic without overt murmurs rubs . Abdomen:  Soft, nontender, nondistended.  Without guarding or rebound. Extremities: Without cyanosis, clubbing, edema, right upper extremity fistula site clean dry intact Skin:  Warm and dry, no erythema, no ulcerations.  Right upper extremity postop fistula site clean dry intact  Data Reviewed: I have personally reviewed following labs and imaging studies  CBC: Recent Labs  Lab 04/06/21 0227 04/06/21 0956 04/06/21 2206 04/07/21 0930 04/07/21 1845 04/08/21 0453  WBC 12.4*  --  13.1* 14.3*  --   10.9*  NEUTROABS 10.9*  --   --   --   --   --   HGB 8.4* 6.8* 6.6* 8.6* 9.0* 8.2*  HCT 25.9* 22.8* 20.1* 25.2* 27.0* 24.1*  MCV 92.8  --  86.6 86.0  --  86.7  PLT 417*  --  289 258  --  355   Basic Metabolic Panel: Recent Labs  Lab 04/02/21 0305 04/03/21 0104 04/04/21 0349 04/06/21 0227 04/07/21 0930 04/08/21 0453  NA 136 139 137 136 140 137  K 5.4* 4.0 3.7 4.5 4.8 3.7  CL 103 103 101 100 106 103  CO2 17* 21* 23 20* 17* 19*  GLUCOSE 71 77 66* 145* 104* 110*  BUN 33* 40* 20 60* 81* 42*  CREATININE 4.33* 5.29* 3.49* 5.65* 7.07* 4.68*  CALCIUM 7.9* 8.0* 8.0* 7.6* 7.5* 8.0*  PHOS 4.7* 5.8* 4.4  --   --   --    GFR: Estimated Creatinine Clearance: 13.7 mL/min (A) (by C-G formula based on SCr of 4.68 mg/dL (H)). Liver Function Tests: Recent Labs  Lab 04/02/21 0305 04/03/21 0104 04/04/21 0349 04/06/21 0227 04/07/21 0930  AST  --   --   --  12* 16  ALT  --   --   --  5 <5  ALKPHOS  --   --   --  46 40  BILITOT  --   --   --  0.3 0.8  PROT  --   --   --  5.0* 4.6*  ALBUMIN 2.1* 2.1* 2.1* 1.8* 1.9*   Recent Labs  Lab 04/06/21 0227  LIPASE 65*   No results for input(s): AMMONIA in the last 168 hours. Coagulation Profile: Recent Labs  Lab 04/06/21 0227 04/07/21 0930 04/08/21 0453  INR 4.0* 1.2 1.3*   Cardiac Enzymes: No results for input(s): CKTOTAL, CKMB, CKMBINDEX, TROPONINI in the last 168 hours. BNP (last 3 results) No results for input(s): PROBNP in the last 8760 hours. HbA1C: No results for input(s): HGBA1C in the last 72 hours. CBG: No results for input(s): GLUCAP in the last 168 hours. Lipid Profile: No results for input(s): CHOL, HDL, LDLCALC, TRIG, CHOLHDL, LDLDIRECT in the last 72 hours. Thyroid Function Tests: No results for input(s): TSH, T4TOTAL, FREET4, T3FREE, THYROIDAB in the last 72 hours. Anemia Panel: No results for input(s): VITAMINB12, FOLATE, FERRITIN, TIBC, IRON, RETICCTPCT in the last 72 hours. Sepsis Labs: Recent Labs  Lab  04/06/21 0956  PROCALCITON 0.29    Recent Results (from the past 240 hour(s))  Surgical pcr screen     Status: Abnormal   Collection Time: 04/03/21  7:05 AM   Specimen: Nasal Mucosa; Nasal Swab  Result Value Ref Range Status   MRSA, PCR POSITIVE (A) NEGATIVE Final    Comment: RESULT CALLED TO, READ BACK BY AND VERIFIED WITH: RN Charlene Brooke [phone]    Staphylococcus aureus POSITIVE (A) NEGATIVE Final    Comment: (NOTE) The Xpert SA Assay (FDA approved for NASAL specimens in patients 83 years of age and older), is one component of a comprehensive surveillance program. It is not intended to diagnose infection nor to guide or monitor treatment. Performed at [facility], [place] [address], [place], [place] [address]   Resp Panel by RT-PCR (Flu A&B, Covid) Nasopharyngeal Swab     Status: None   Collection Time: 04/06/21  2:04 AM   Specimen: Nasopharyngeal Swab; Nasopharyngeal(NP) swabs in vial transport medium  Result Value Ref Range Status   SARS Coronavirus 2 by RT PCR NEGATIVE NEGATIVE Final    Comment: (NOTE) SARS-CoV-2 target nucleic acids are NOT DETECTED.  The SARS-CoV-2 RNA is generally detectable in upper respiratory specimens during the acute phase of infection. The lowest concentration of SARS-CoV-2 viral copies this assay can detect is 138 copies/mL. A negative result does not preclude SARS-Cov-2 infection and should not be used as the sole basis for treatment or other patient management decisions. A negative result may occur with  improper specimen collection/handling, submission of specimen other than nasopharyngeal swab, presence of viral mutation(s) within the areas targeted by this assay, and inadequate number of viral copies(<138 copies/mL). A negative result must be combined with clinical observations, patient history, and epidemiological information. The expected result is Negative.  Fact Sheet for Patients:   EntrepreneurPulse.com.au  Fact Sheet for Healthcare Providers:  IncredibleEmployment.be  This test is no t yet approved or cleared by the [place] FDA and  has been authorized for detection and/or diagnosis of SARS-CoV-2 by FDA under an Emergency Use Authorization (EUA). This EUA will remain  in effect (meaning this test can be used) for the duration of the COVID-19 declaration under Section 564(b)(1) of the Act, 21 U.S.C.section 360bbb-3(b)(1), unless the authorization is terminated  or revoked sooner.  Influenza A by PCR NEGATIVE NEGATIVE Final   Influenza B by PCR NEGATIVE NEGATIVE Final    Comment: (NOTE) The Xpert Xpress SARS-CoV-2/FLU/RSV plus assay is intended as an aid in the diagnosis of influenza from Nasopharyngeal swab specimens and should not be used as a sole basis for treatment. Nasal washings and aspirates are unacceptable for Xpert Xpress SARS-CoV-2/FLU/RSV testing.  Fact Sheet for Patients: EntrepreneurPulse.com.au  Fact Sheet for Healthcare Providers: IncredibleEmployment.be  This test is not yet approved or cleared by the [place] FDA and has been authorized for detection and/or diagnosis of SARS-CoV-2 by FDA under an Emergency Use Authorization (EUA). This EUA will remain in effect (meaning this test can be used) for the duration of the COVID-19 declaration under Section 564(b)(1) of the Act, 21 U.S.C. section 360bbb-3(b)(1), unless the authorization is terminated or revoked.  Performed at [facility], [place] [address] [place], [place] [address]      Radiology Studies: No results found.  Scheduled Meds:  sodium chloride   Intravenous Once   sodium chloride   Intravenous Once   Chlorhexidine Gluconate Cloth  6 each Topical Daily   Chlorhexidine Gluconate Cloth  6 each Topical Q0600   diltiazem  60 mg Oral Q12H   pantoprazole  40 mg Oral BID    pravastatin  40 mg Oral QHS   Continuous Infusions:  diltiazem (CARDIZEM) infusion 15 mg/hr (04/08/21 1325)     LOS: 2 days   Time spent: 74min  Wayne C Jayd Forrey, DO [facility]  If 7PM-7AM, please contact night-coverage [email or web address]  04/08/2021, 3:42 PM

## 2021-04-08 NOTE — Consult Note (Addendum)
°  Postoperative hemodialysis access   Date of Surgery:  04/04/2021 Surgeon: Stanford Breed  Subjective:  c/o pain in the crease of the arm especially with palpation and straightening the arm.  Denies any pain in the right hand   PHYSICAL EXAMINATION:  Vitals:   04/08/21 0421 04/08/21 0726  BP: (!) 169/60 (!) 157/66  Pulse: 96 89  Resp: 14 13  Temp: 98.5 F (36.9 C) 98.3 F (36.8 C)  SpO2: 99% 99%    Incision is clean with scant bloody drainage.   Right arm with some swelling and tenderness around the incision.  Appears to be diffuse hematoma around incision. Sensation in digits is intact; motor in tact; right hand is warm. There is an excellent thrill throughout the fistula The fistula is easily palpable throughout the upper arm The right radial pulse is easily palpable   ASSESSMENT/PLAN:  Wayne Meadows is a 87 y.o. year old male who is s/p right BC AVF on 04/04/2021 by Dr. Stanford Breed.  - fistula is patent and easily palpable throughout the upper arm.  -he does have pain at the antecubital space and around the incision with palpation.   Denies pain around the right elbow with palpation.   Appears to have diffuse hematoma.  He was having incisional pain on POD 1 as well.   -pt does not have evidence of steal sx as he denies any pain in his right hand and motor and sensory are in tact.  -Recommend elevation of right arm as tolerated.    Leontine Locket, PA-C [facility] [phone]  I have interviewed the patient and examined the patient. I agree with the findings by the PA.  Good thrill in fistula.  The right hand is warm and well-perfused.  Will follow.  Gae Gallop, MD

## 2021-04-08 NOTE — Transfer of Care (Signed)
Immediate Anesthesia Transfer of Care Note  Patient: Wayne Meadows  Procedure(s) Performed: ESOPHAGOGASTRODUODENOSCOPY (EGD) WITH PROPOFOL BIOPSY HEMOSTASIS CLIP PLACEMENT  Patient Location: Endoscopy  Anesthesia Type:MAC  Level of Consciousness: drowsy and patient cooperative  Airway & Oxygen Therapy: Patient Spontanous Breathing and Patient connected to nasal cannula oxygen  Post-op Assessment: Report given to RN and Post -op Vital signs reviewed and stable  Post vital signs: Reviewed and stable  Last Vitals:  Vitals Value Taken Time  BP 145/73 04/08/21 1116  Temp    Pulse 82 04/08/21 1120  Resp 16 04/08/21 1120  SpO2 94 % 04/08/21 1120    Last Pain:  Vitals:   04/08/21 0854  TempSrc: Temporal  PainSc: 0-No pain      Patients Stated Pain Goal: 3 (68/25/74 9355)  Complications: No notable events documented.

## 2021-04-08 NOTE — Progress Notes (Signed)
[facility] Progress Note   Subjective:   Patient seen and examined at bedside.  Reports he is still feeling groggy from the sedation this AM.  Denies CP, SOB, abdominal pain, weakness and fatigue.  Objective Vitals:   04/08/21 1339 04/08/21 1353 04/08/21 1403 04/08/21 1433  BP: 122/83 (!) 142/90 (!) 135/102   Pulse: (!) 128 (!) 114 (!) 238   Resp: 16 (!) 27 16 13   Temp:   98.4 F (36.9 C)   TempSrc:      SpO2: 95% 98%    Weight:      Height:       Physical Exam General:chronically ill appearing male in NAD Heart:RRR, no mrg Lungs:CTAB anterolaterally, nml WOB on 4L O2 via [place] Abdomen:soft, NTND Extremities:trace LE edema Dialysis Access: [facility], RU AVF +b/t   Filed Weights   04/06/21 0155 04/07/21 1218 04/07/21 1528  Weight: 89.7 kg 90.3 kg 88.3 kg    Intake/Output Summary (Last 24 hours) at 04/08/2021 1441 Last data filed at 04/08/2021 1349 Gross per 24 hour  Intake 1993.4 ml  Output 2250 ml  Net -256.6 ml    Additional Objective Labs: Basic Metabolic Panel: Recent Labs  Lab 04/02/21 0305 04/03/21 0104 04/04/21 0349 04/06/21 0227 04/07/21 0930 04/08/21 0453  NA 136 139 137 136 140 137  K 5.4* 4.0 3.7 4.5 4.8 3.7  CL 103 103 101 100 106 103  CO2 17* 21* 23 20* 17* 19*  GLUCOSE 71 77 66* 145* 104* 110*  BUN 33* 40* 20 60* 81* 42*  CREATININE 4.33* 5.29* 3.49* 5.65* 7.07* 4.68*  CALCIUM 7.9* 8.0* 8.0* 7.6* 7.5* 8.0*  PHOS 4.7* 5.8* 4.4  --   --   --    Liver Function Tests: Recent Labs  Lab 04/04/21 0349 04/06/21 0227 04/07/21 0930  AST  --  12* 16  ALT  --  5 <5  ALKPHOS  --  46 40  BILITOT  --  0.3 0.8  PROT  --  5.0* 4.6*  ALBUMIN 2.1* 1.8* 1.9*   Recent Labs  Lab 04/06/21 0227  LIPASE 65*   CBC: Recent Labs  Lab 04/06/21 0227 04/06/21 0956 04/06/21 2206 04/07/21 0930 04/07/21 1845 04/08/21 0453  WBC 12.4*  --  13.1* 14.3*  --  10.9*  NEUTROABS 10.9*  --   --   --   --   --   HGB 8.4*   < > 6.6* 8.6* 9.0* 8.2*  HCT  25.9*   < > 20.1* 25.2* 27.0* 24.1*  MCV 92.8  --  86.6 86.0  --  86.7  PLT 417*  --  289 258  --  235   < > = values in this interval not displayed.     Medications:  diltiazem (CARDIZEM) infusion 15 mg/hr (04/08/21 1325)    sodium chloride   Intravenous Once   sodium chloride   Intravenous Once   Chlorhexidine Gluconate Cloth  6 each Topical Daily   Chlorhexidine Gluconate Cloth  6 each Topical Q0600   pantoprazole  40 mg Oral BID   pravastatin  40 mg Oral QHS    Dialysis Orders: MWF  - [facility]  3>3.5>4hrs, BFR 400, DFR 800  EDW 87kg, 2K/ 2.5Ca   Access: TDC, AVF maturing  Heparin 3000 unit bolus Mircera 50 mcg q2wks - not given   Assessment/Plan:  Acute GIB - Hgb drop 8.4>6.6 s/p 3 units pRBC.  EGD today with gastric and duodenal ulcers biopsied as well as salmon  colored gastric mucosa.  GI following.  Leukocytosis - Improving WBC 14.9>10.9. PNA noted on CXR/CT.  ABX started - azithromax.  RUE pain/swelling - seen by VVS, no indication of steal syndrome.  Possible hematoma.  Recommend to elevate arm as able.   ESRD -  on MWF, new start at [facility] last admit.  No outpatient HD yet. Continue on regular schedule. No heparin.   Hypertension/volume  - BP elevated, continue home meds.  Working to establish EDW, continue UF as tolerated. 1.5L removed with HD yesterday.  ABLA on chronic anemia of CKD - due to #1. As above.  Continue ESA.   Secondary Hyperparathyroidism -  Calcium low, use increased Ca bath.  Check phos. Not on VDRA.  Nutrition - Renal diet w/fluid restrictions.  Hx CAD DMT2 - per PMD Hx CVA  Jen Mow, PA-C [facility] 04/08/2021,2:41 PM  LOS: 2 days

## 2021-04-08 NOTE — Op Note (Signed)
[facility] Patient Name: Wayne Meadows Procedure Date : 04/08/2021 MRN: [id number] Attending MD: Gerrit Heck , MD Date of Birth: [birth year] CSN: [id number] Age: 87 Admit Type: Inpatient Procedure:                Upper GI endoscopy Indications:              Acute post hemorrhagic anemia, Coffee-ground                            emesis, Hematemesis, Melena Providers:                Gerrit Heck, MD, Ervin Knack, RN, Frazier Richards, Technician Referring MD:              Medicines:                Monitored Anesthesia Care Complications:            No immediate complications. Estimated Blood Loss:     Estimated blood loss was minimal. Procedure:                Pre-Anesthesia Assessment:                           - Prior to the procedure, a History and Physical                            was performed, and patient medications and                            allergies were reviewed. The patient's tolerance of                            previous anesthesia was also reviewed. The risks                            and benefits of the procedure and the sedation                            options and risks were discussed with the patient.                            All questions were answered, and informed consent                            was obtained. Prior Anticoagulants: The patient has                            taken no previous anticoagulant or antiplatelet                            agents. ASA Grade Assessment: III - A patient with  severe systemic disease. After reviewing the risks                            and benefits, the patient was deemed in                            satisfactory condition to undergo the procedure.                           After obtaining informed consent, the endoscope was                            passed under direct vision. Throughout the                            procedure, the  patient's blood pressure, pulse, and                            oxygen saturations were monitored continuously. The                            [id number]-H190 ([id number]) Olympus endoscope was introduced                            through the mouth, and advanced to the second part                            of duodenum. The upper GI endoscopy was                            accomplished without difficulty. The patient                            tolerated the procedure well. Scope In: Scope Out: Findings:      Islands of salmon-colored mucosa were present from 37 to 39 cm. No other       visible abnormalities were present. Biopsies were taken with a cold       forceps for histology. There was persistent mild oozing at one of the       biopsy sites. For hemostasis, one hemostatic clip was successfully       placed (MR conditional). There was no bleeding at the end of the       procedure.      The upper third of the esophagus and middle third of the esophagus were       normal.      Two non-bleeding superficial gastric ulcers with no stigmata of bleeding       were found in the prepyloric region of the stomach. The largest lesion       was 2 mm in largest dimension. Biopsies were taken with a cold forceps       for histology. Estimated blood loss was minimal. The remainder of the       stomach was normal appearing.      Three non-bleeding cratered duodenal ulcers with no stigmata of bleeding       were found in the  duodenal bulb and in the first portion of the       duodenum. The largest lesion was 6 mm in largest dimension. Biopsies       were taken with a cold forceps for histology. Estimated blood loss was       minimal. Impression:               - Salmon-colored mucosa. Biopsied.                           - Normal upper third of esophagus and middle third                            of esophagus.                           - Non-bleeding gastric ulcers with no stigmata of                             bleeding. Biopsied.                           - Non-bleeding duodenal ulcers with no stigmata of                            bleeding. Biopsied.                           - Normal 2nd portion of the duodenum. Recommendation:           - Return patient to hospital ward for ongoing care.                           - Advance diet as tolerated.                           - Continue present medications.                           - Await pathology results.                           - Use Protonix (pantoprazole) 40 mg PO BID for 6                            weeks to promote mucosal healing, then reduce to 40                            mg daily. Procedure Code(s):        --- Professional ---                           [phone], Esophagogastroduodenoscopy, flexible,                            transoral; with biopsy, single or multiple Diagnosis Code(s):        --- Professional ---  K22.8, Other specified diseases of esophagus                           K25.9, Gastric ulcer, unspecified as acute or                            chronic, without hemorrhage or perforation                           K26.9, Duodenal ulcer, unspecified as acute or                            chronic, without hemorrhage or perforation                           D62, Acute posthemorrhagic anemia                           K92.0, Hematemesis                           K92.1, Melena (includes Hematochezia) CPT copyright 2019 American Medical Association. All rights reserved. The codes documented in this report are preliminary and upon coder review may  be revised to meet current compliance requirements. Gerrit Heck, MD 04/08/2021 11:32:48 AM Number of Addenda: 0

## 2021-04-08 NOTE — Interval H&P Note (Signed)
History and Physical Interval Note:  04/08/2021 10:06 AM  Wayne Meadows  has presented today for surgery, with the diagnosis of GI bleed.  The various methods of treatment have been discussed with the patient and family. After consideration of risks, benefits and other options for treatment, the patient has consented to  Procedure(s): ESOPHAGOGASTRODUODENOSCOPY (EGD) WITH PROPOFOL (N/A) as a surgical intervention.  The patient's history has been reviewed, patient examined, no change in status, stable for surgery.  I have reviewed the patient's chart and labs.  Questions were answered to the patient's satisfaction.     Dominic Pea Khalie Wince

## 2021-04-08 NOTE — Plan of Care (Addendum)
Voiced concern to Dr. Avon Gully about patient right arm still red, painful and swollen. [place] reaching out to vascular for further evaluation.   Problem: Education: Goal: Knowledge of General Education information will improve Description: Including pain rating scale, medication(s)/side effects and non-pharmacologic comfort measures Outcome: Progressing   Problem: Activity: Goal: Risk for activity intolerance will decrease Outcome: Progressing   Problem: Pain Managment: Goal: General experience of comfort will improve Outcome: Progressing   Problem: Safety: Goal: Ability to remain free from injury will improve Outcome: Progressing   Problem: Skin Integrity: Goal: Risk for impaired skin integrity will decrease Outcome: Progressing

## 2021-04-08 NOTE — Progress Notes (Signed)
°  Patient went in to Afib with RVR in endo. Cardizem drip initiated. Patient came back to the floor will heart rate rising. Cardizem drip titrated to the max 15 mg/hr. Dr Avon Gully at beside along with rapid response [facility]. Dr. Avon Gully ordered one dose lopressor.   Patient stated on PO Cardizem, per [facility] DC drip one hour post PO Cardizem administration.     04/08/21 1245  Assess: MEWS Score  Temp 98.3 F (36.8 C)  BP (!) 164/64  Pulse Rate (!) 127  Resp 19  SpO2 93 %  O2 Device Nasal Cannula  O2 Flow Rate (L/min) 3 L/min  Assess: if the MEWS score is Yellow or Red  Were vital signs taken at a resting state? Yes  Focused Assessment Change from prior assessment (see assessment flowsheet)  Early Detection of Sepsis Score *See Row Information* Low  MEWS guidelines implemented *See Row Information* Yes  Treat  MEWS Interventions Escalated (See documentation below)  Take Vital Signs  Increase Vital Sign Frequency  Yellow: Q 2hr X 2 then Q 4hr X 2, if remains yellow, continue Q 4hrs  Escalate  MEWS: Escalate Yellow: discuss with charge nurse/RN and consider discussing with provider and RRT  Notify: Provider  Provider Name/Title Avon Gully MD  Date Provider Notified 04/08/21 (1250)  Time Provider Notified 1250  Notification Type Page  Notification Reason Change in status  Provider response At bedside;See new orders  Date of Provider Response 04/08/21 (1250)  Time of Provider Response 1250  Notify: Rapid Response  Name of Rapid Response RN Notified Helle RN  Date Rapid Response Notified 04/08/21  Time Rapid Response Notified 1250  Document  Patient Outcome Stabilized after interventions  Progress note created (see row info) Yes

## 2021-04-08 NOTE — Anesthesia Preprocedure Evaluation (Signed)
Anesthesia Evaluation  Patient identified by MRN, date of birth, ID band Patient awake    Reviewed: Allergy & Precautions, NPO status , Patient's Chart, lab work & pertinent test results  History of Anesthesia Complications (+) history of anesthetic complications  Airway Mallampati: III  TM Distance: >3 FB Neck ROM: Full    Dental  (+) Edentulous Upper, Edentulous Lower, Dental Advisory Given   Pulmonary COPD, former smoker,    breath sounds clear to auscultation       Cardiovascular hypertension, (-) angina+ CAD and + Past MI   Rhythm:Regular  1. Left ventricular ejection fraction, by estimation, is 65 to 70%. The  left ventricle has hyperdynamic function. The left ventricle has no  regional wall motion abnormalities. There is mild concentric left  ventricular hypertrophy. Left ventricular  diastolic parameters are consistent with Grade I diastolic dysfunction  (impaired relaxation).  2. Right ventricular systolic function is normal. The right ventricular  size is normal. There is mildly elevated pulmonary artery systolic  pressure.  3. Left atrial size was mildly dilated.  4. 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  can't rule out PFO.  5. Right atrial size was mildly dilated.  6. The mitral valve is normal in structure. No evidence of mitral valve  regurgitation.  7. The aortic valve was not well visualized. Aortic valve regurgitation  is mild to moderate.  8. The inferior vena cava is normal in size with greater than 50%  respiratory variability, suggesting right atrial pressure of 3 mmHg.    ? LV end diastolic pressure is moderately elevated. 17 mmHg ? ------------------------ ? RCA is a large-caliber, dominant vessel ? Prox RCA to Mid RCA bare-metal stent is 45% stenosed. ? --- RPDA lesion is 60% stenosed. ? Mid LAD lesion is 50% stenosed. Previously noted, mild diffuse disease  otherwise noted. ? ----1st Diag lesion is 100% stenosed. Known ? Previously placed Ramus stent (unknown type) is widely patent. Remainder of the ramus intermedius is free of disease. ? LCx is a relatively small caliber, nondominant vessel with 1 small OM branch in the AV groove branch. Most of the LCx territory is covered by the Ramus Intermedius.   POST-OPERATIVE DIAGNOSIS: ? Stable three-vessel disease with widely patent stent in the proximal Ramus Intermedius, moderate 40 to 50% in-stent restenosis in the proximal RCA BMS, focal 50% mid LAD just prior to a bend (the previously noted subtotaled first diagonal branch is now fully occluded). ? Severe Systemic Hypertensionwith moderately elevated LVEDP. (LV gram not performed because of contrast conservation)   RECOMMENDATIONS           ? Consider evaluation for causes of nonanginal chest pain ? Patient will require additional blood pressure monitoring and IV fluid resuscitation after cardiac catheterization. We will determine discharge plans based on primary team discussion.    Neuro/Psych PSYCHIATRIC DISORDERS Anxiety CVA    GI/Hepatic Neg liver ROS, ? GI bleed   Endo/Other  Hypothyroidism   Renal/GU ESRF and DialysisRenal diseaseLab Results      Component                Value               Date                      CREATININE               4.68 (H)  04/08/2021           Lab Results      Component                Value               Date                      K                        3.7                 04/08/2021                Musculoskeletal  (+) Arthritis ,   Abdominal   Peds  Hematology  (+) Blood dyscrasia, anemia ,   Anesthesia Other Findings   Reproductive/Obstetrics                             Anesthesia Physical Anesthesia Plan  ASA: 4  Anesthesia Plan: MAC   Post-op Pain Management:    Induction: Intravenous  PONV Risk Score and Plan: 1 and Propofol infusion  and Treatment may vary due to age or medical condition  Airway Management Planned: Nasal Cannula  Additional Equipment: None  Intra-op Plan:   Post-operative Plan:   Informed Consent: I have reviewed the patients History and Physical, chart, labs and discussed the procedure including the risks, benefits and alternatives for the proposed anesthesia with the patient or authorized representative who has indicated his/her understanding and acceptance.     Dental advisory given  Plan Discussed with: CRNA and Anesthesiologist  Anesthesia Plan Comments:         Anesthesia Quick Evaluation

## 2021-04-09 ENCOUNTER — Encounter (HOSPITAL_COMMUNITY): Payer: Self-pay | Admitting: Gastroenterology

## 2021-04-09 DIAGNOSIS — I480 Paroxysmal atrial fibrillation: Secondary | ICD-10-CM

## 2021-04-09 DIAGNOSIS — I4891 Unspecified atrial fibrillation: Secondary | ICD-10-CM

## 2021-04-09 DIAGNOSIS — I10 Essential (primary) hypertension: Secondary | ICD-10-CM

## 2021-04-09 HISTORY — DX: Paroxysmal atrial fibrillation: I48.0

## 2021-04-09 LAB — BASIC METABOLIC PANEL
Anion gap: 12 (ref 5–15)
BUN: 49 mg/dL — ABNORMAL HIGH (ref 8–23)
CO2: 22 mmol/L (ref 22–32)
Calcium: 7.6 mg/dL — ABNORMAL LOW (ref 8.9–10.3)
Chloride: 104 mmol/L (ref 98–111)
Creatinine, Ser: 5.43 mg/dL — ABNORMAL HIGH (ref 0.61–1.24)
GFR, Estimated: 10 mL/min — ABNORMAL LOW (ref >60)
Glucose, Bld: 106 mg/dL — ABNORMAL HIGH (ref 70–99)
Potassium: 3.8 mmol/L (ref 3.5–5.1)
Sodium: 138 mmol/L (ref 135–145)

## 2021-04-09 LAB — PROTIME-INR
INR: 1.2 (ref 0.8–1.2)
Prothrombin Time: 15.5 seconds — ABNORMAL HIGH (ref 11.4–15.2)

## 2021-04-09 LAB — CBC
HCT: 23 % — ABNORMAL LOW (ref 39.0–52.0)
Hemoglobin: 7.6 g/dL — ABNORMAL LOW (ref 13.0–17.0)
MCH: 29.1 pg (ref 26.0–34.0)
MCHC: 33 g/dL (ref 30.0–36.0)
MCV: 88.1 fL (ref 80.0–100.0)
Platelets: 265 10*3/uL (ref 150–400)
RBC: 2.61 MIL/uL — ABNORMAL LOW (ref 4.22–5.81)
RDW: 16.5 % — ABNORMAL HIGH (ref 11.5–15.5)
WBC: 9.8 10*3/uL (ref 4.0–10.5)
nRBC: 0 % (ref 0.0–0.2)

## 2021-04-09 MED ORDER — POLYETHYLENE GLYCOL 3350 17 G PO PACK: 17.0000 g | PACK | Freq: Every day | ORAL | Status: DC | PRN

## 2021-04-09 MED ORDER — CAMPHOR-MENTHOL 0.5-0.5 % EX LOTN
TOPICAL_LOTION | CUTANEOUS | Status: DC | PRN
  Filled 2021-04-09 (×2): qty 222

## 2021-04-09 MED ORDER — BACITRACIN-NEOMYCIN-POLYMYXIN OINTMENT TUBE
TOPICAL_OINTMENT | Freq: Three times a day (TID) | CUTANEOUS | Status: DC
  Filled 2021-04-09: qty 14

## 2021-04-09 MED ORDER — CLONIDINE HCL 0.1 MG PO TABS
0.1000 mg | ORAL_TABLET | Freq: Every day | ORAL | Status: DC
  Administered 2021-04-09 – 2021-04-11 (×3): 0.1 mg via ORAL
  Filled 2021-04-09 (×3): qty 1

## 2021-04-09 MED ORDER — HEPARIN SODIUM (PORCINE) 1000 UNIT/ML IJ SOLN
3800.0000 [IU] | INTRAMUSCULAR | Status: DC | PRN
  Filled 2021-04-09: qty 3.8

## 2021-04-09 MED ORDER — LEVOTHYROXINE SODIUM 50 MCG PO TABS
50.0000 ug | ORAL_TABLET | Freq: Every day | ORAL | Status: DC
  Administered 2021-04-10 – 2021-04-11 (×2): 50 ug via ORAL
  Filled 2021-04-09 (×2): qty 1

## 2021-04-09 MED ORDER — HEPARIN SODIUM (PORCINE) 1000 UNIT/ML IJ SOLN
INTRAMUSCULAR | Status: AC
Start: 2021-04-09 — End: 2021-04-09
  Administered 2021-04-09: 3800 [IU]
  Filled 2021-04-09: qty 4

## 2021-04-09 NOTE — Plan of Care (Signed)

## 2021-04-09 NOTE — Anesthesia Postprocedure Evaluation (Signed)
Anesthesia Post Note  Patient: [place]  Procedure(s) Performed: ESOPHAGOGASTRODUODENOSCOPY (EGD) WITH PROPOFOL BIOPSY HEMOSTASIS CLIP PLACEMENT     Patient location during evaluation: PACU Anesthesia Type: MAC Level of consciousness: awake and alert Pain management: pain level controlled Vital Signs Assessment: post-procedure vital signs reviewed and stable Respiratory status: spontaneous breathing, nonlabored ventilation, respiratory function stable and patient connected to nasal cannula oxygen Cardiovascular status: stable and blood pressure returned to baseline Postop Assessment: no apparent nausea or vomiting Anesthetic complications: no   No notable events documented.  Last Vitals:  Vitals:   04/09/21 1322 04/09/21 1349  BP: (!) 174/67 (!) 180/60  Pulse: 77 84  Resp: 15 18  Temp: 36.8 C 36.9 C  SpO2: 100% 98%    Last Pain:  Vitals:   04/09/21 2100  TempSrc:   PainSc: 0-No pain                 [name]

## 2021-04-09 NOTE — Assessment & Plan Note (Addendum)
Continue levothyroxine 

## 2021-04-09 NOTE — Plan of Care (Signed)
VSS. Currently on 3L O2. Given PRN pain medications for RUE pain. RUE cleansed, antibiotic ointment applied. Repositioned for comfort. Call bell within reach. Bed alarm on.   Problem: Clinical Measurements: Goal: Ability to maintain clinical measurements within normal limits will improve Outcome: Progressing Goal: Will remain free from infection Outcome: Progressing Goal: Diagnostic test results will improve Outcome: Progressing Goal: Respiratory complications will improve Outcome: Progressing Goal: Cardiovascular complication will be avoided Outcome: Progressing   Problem: Elimination: Goal: Will not experience complications related to bowel motility Outcome: Progressing Goal: Will not experience complications related to urinary retention Outcome: Progressing   Problem: Pain Managment: Goal: General experience of comfort will improve Outcome: Progressing   Problem: Safety: Goal: Ability to remain free from injury will improve Outcome: Progressing   Problem: Skin Integrity: Goal: Risk for impaired skin integrity will decrease Outcome: Progressing

## 2021-04-09 NOTE — Progress Notes (Signed)
[facility] Progress Note   Subjective:   Patient seen and examined at bedside.  Afib w/ RVR yesterday after EGD, improved with cardizem. He reports that his rt arm is still bothering him, still swollen. Otherwise no other complaints. Open for HD today.  Objective Vitals:   04/09/21 0941 04/09/21 1005 04/09/21 1011 04/09/21 1030  BP:  (!) 170/64 (!) 165/66 (!) 161/65  Pulse:  76 74 78  Resp:   14   Temp:  98 F (36.7 C)    TempSrc:  Oral    SpO2:  100% 100%   Weight: 88 kg     Height:       Physical Exam General:chronically ill appearing male in NAD Heart:RRR, no mrg Lungs:CTAB anterolaterally, nml WOB on 4L O2 via [place] Abdomen:soft, NTND Extremities: swollen RUE Dialysis Access: [facility], RU AVF +b/t   Filed Weights   04/07/21 1218 04/07/21 1528 04/09/21 0941  Weight: 90.3 kg 88.3 kg 88 kg    Intake/Output Summary (Last 24 hours) at 04/09/2021 1102 Last data filed at 04/09/2021 0926 Gross per 24 hour  Intake 510.56 ml  Output 1110 ml  Net -599.44 ml    Additional Objective Labs: Basic Metabolic Panel: Recent Labs  Lab 04/03/21 0104 04/04/21 0349 04/06/21 0227 04/07/21 0930 04/08/21 0453 04/09/21 0416  NA 139 137   < > 140 137 138  K 4.0 3.7   < > 4.8 3.7 3.8  CL 103 101   < > 106 103 104  CO2 21* 23   < > 17* 19* 22  GLUCOSE 77 66*   < > 104* 110* 106*  BUN 40* 20   < > 81* 42* 49*  CREATININE 5.29* 3.49*   < > 7.07* 4.68* 5.43*  CALCIUM 8.0* 8.0*   < > 7.5* 8.0* 7.6*  PHOS 5.8* 4.4  --   --   --   --    < > = values in this interval not displayed.   Liver Function Tests: Recent Labs  Lab 04/04/21 0349 04/06/21 0227 04/07/21 0930  AST  --  12* 16  ALT  --  5 <5  ALKPHOS  --  46 40  BILITOT  --  0.3 0.8  PROT  --  5.0* 4.6*  ALBUMIN 2.1* 1.8* 1.9*   Recent Labs  Lab 04/06/21 0227  LIPASE 65*   CBC: Recent Labs  Lab 04/06/21 0227 04/06/21 0956 04/06/21 2206 04/07/21 0930 04/07/21 1845 04/08/21 0453 04/09/21 0416  WBC  12.4*  --  13.1* 14.3*  --  10.9* 9.8  NEUTROABS 10.9*  --   --   --   --   --   --   HGB 8.4*   < > 6.6* 8.6* 9.0* 8.2* 7.6*  HCT 25.9*   < > 20.1* 25.2* 27.0* 24.1* 23.0*  MCV 92.8  --  86.6 86.0  --  86.7 88.1  PLT 417*  --  289 258  --  235 265   < > = values in this interval not displayed.     Medications:    sodium chloride   Intravenous Once   sodium chloride   Intravenous Once   Chlorhexidine Gluconate Cloth  6 each Topical Daily   Chlorhexidine Gluconate Cloth  6 each Topical Q0600   cloNIDine  0.1 mg Oral Daily   diltiazem  60 mg Oral Q12H   [START ON 04/10/2021] levothyroxine  50 mcg Oral QAC breakfast   pantoprazole  40 mg Oral  BID   pravastatin  40 mg Oral QHS    Dialysis Orders: MWF  - [facility]  3>3.5>4hrs, BFR 400, DFR 800  EDW 87kg, 2K/ 2.5Ca   Access: TDC, AVF maturing  Heparin 3000 unit bolus Mircera 50 mcg q2wks - not given   Assessment/Plan:  Acute GIB - Hgb drop 8.4>6.6 s/p 3 units pRBC.  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  GI following.  Leukocytosis - resolved. PNA noted on CXR/CT.  ABX started - azithromax.  Afib w/ RVR: controlled w/ cardizem, now on PO. Per pmd, will follow with cardio as an outpatient RUE pain/swelling - seen by VVS, no indication of steal syndrome.  Possible hematoma.  Recommend to elevate arm as able.   ESRD -  on MWF, new start at [facility] last admit.  No outpatient HD yet. Continue on regular schedule. No heparin. HD today  Hypertension/volume  - BP elevated, continue home meds.  Working to establish EDW, continue UF as tolerated.  ABLA on chronic anemia of CKD - due to #1. As above.  Continue ESA.   Secondary Hyperparathyroidism -  Calcium low, use increased Ca bath.  Check phos. Not on VDRA.  Nutrition - Renal diet w/fluid restrictions.  Hx CAD DMT2 - per PMD Hx CVA  Gean Quint, MD [facility] 04/09/2021,11:02 AM  LOS: 3 days

## 2021-04-09 NOTE — Hospital Course (Addendum)
87 y.o. male with history of chronic kidney disease stage IIIb, CVA and CAD, diabetes mellitus, COPD, hypertension was admitted twice in the recent past - once for cellulitis of the lower extremity, discharged on antibiotics, second for worsening renal function and transitioned to HD due to acute renal failure. Discharged 2/10 and stable for PT, tolerating dialysis well. Patient presents back to hospital again from SNF for episode of coffee ground emesis overnight. GI consulted for follow up.   2/6 Status post right IJ tunneled HD catheter placed by IR 2/7 Tolerated first HD 2/8 Complaining of nausea, resolved with treatment 2/9 RUE Brachiocephalic AV fistula placement 2/10 - DISCHARGED TO REHAB 2/11 - READMITTED DUE TO COFFEE GROUND EMESIS AND WORSENING ANEMIA 2/12-13 - Continues to be anemic - EGD pending INR correction 2/14 - EGD shows nonbleeding ulcer, patient complaining of worsening RUE pain at new fistula sight - vascular asked to evaluate;  post EGD A-fib with RVR resolved with diltiazem

## 2021-04-09 NOTE — Progress Notes (Signed)
[facility] PROGRESS NOTE   Wayne Meadows PTW:[id number] DOB: 07-22-1934 DOA: 04/06/2021  3 DOS: the patient was seen and examined on 04/09/2021  PCP: [name]  Brief History and Hospital Course:  87 y.o. male with history of chronic kidney disease stage IIIb, CVA and CAD, diabetes mellitus, COPD, hypertension was admitted twice in the recent past - once for cellulitis of the lower extremity, discharged on antibiotics, second for worsening renal function and transitioned to HD due to acute renal failure. Discharged 2/10 and stable for PT, tolerating dialysis well. Patient presents back to hospital again from SNF for episode of coffee ground emesis overnight. GI consulted for follow up.   2/6 Status post right IJ tunneled HD catheter placed by IR 2/7 Tolerated first HD 2/8 Complaining of nausea, resolved with treatment 2/9 RUE Brachiocephalic AV fistula placement 2/10 - DISCHARGED TO REHAB 2/11 - READMITTED DUE TO COFFEE GROUND EMESIS AND WORSENING ANEMIA 2/12-13 - Continues to be anemic - EGD pending INR correction 2/14 - EGD shows nonbleeding ulcer, patient complaining of worsening RUE pain at new fistula sight - vascular asked to evaluate; post EGD A-fib with RVR improving with diltiazem drip  Consultants: Gastroenterology  Procedures: EGD 2/14    Subjective: Overall feels well.  Concerned about his right arm swelling.  Denies any chest pain shortness of breath.  No nausea vomiting.    Assessment/Plan:  * Acute upper GI bleed- (present on admission) Patient presented with coffee-ground emesis.  Seen by gastroenterology.  Underwent EGD on 2/14 which showed nonbleeding gastric and duodenal ulcers.  These were biopsied.  PPI twice a day as recommended for 6 weeks followed by once a day.    Acute blood loss anemia- (present on admission) Has hx of chronic anemia with Hgb in the 9.5-10 range.  Hemoglobin dropped to 6.6.  Patient was transfused PRBC. Noted  to be 7.6 this morning.  We will recheck tomorrow.  Has not had any recurrence of his bleeding.  Paroxysmal atrial fibrillation with RVR ([place]) Patient was noted to be in atrial fibrillation with RVR after his upper endoscopy.  Required diltiazem infusion briefly.  Subsequently transitioned to oral diltiazem.  Noted to be in sinus rhythm this morning.  Did not see any history of atrial fibrillation mention in the chart previously.  Cardiology notes also reviewed.  However based on Dr. Justus Memory note from yesterday as per his discussion with patient he does have a history of paroxysmal atrial fibrillation and is not on anticoagulation due to low burden. Will defer these issues to cardiology in the outpatient setting. He is followed by Dr. Burt Knack.  ESRD (end stage renal disease) on dialysis [facility] Nephrology is following.  He is on a Monday Wednesday Friday schedule.   Recently underwent fistula placement.  As result he still has right arm swelling.  Patient reassured.  Vascular surgery has seen the patient.  Has good radial pulses.  COPD (chronic obstructive pulmonary disease) ([place])- (present on admission) Stable from a pulmonary standpoint.  Essential hypertension- (present on admission) Monitor blood pressures closely.  Noted to be on diltiazem.  Looks like he was on amlodipine metoprolol and clonidine previously. Blood pressure noted to be elevated.  Will resume clonidine to avoid rebound hypertension.  Community acquired pneumonia Started on antibiotics initially but then discontinued due to normal procalcitonin.  Leukocytosis thought to be reactive   Hypothyroidism- (present on admission) Continue levothyroxine  Chronic pancreatitis ([place])- (present on admission) Stable.  Supratherapeutic INR Reason for  elevated INR not clear.  Improved with vitamin K.    Stage I decubitus buttocks left Pressure Injury 04/06/21 Buttocks Left Stage 1 -  Intact skin with non-blanchable redness of a  localized area usually over a bony prominence. healing area with dry flaky area (Active)  04/06/21 2140  Location: Buttocks  Location Orientation: Left  Staging: Stage 1 -  Intact skin with non-blanchable redness of a localized area usually over a bony prominence.  Wound Description (Comments): healing area with dry flaky area  Present on Admission: Yes       DVT Prophylaxis: SCDs Code Status: Full code Family Communication: Discussed with patient Disposition Plan: Here from skilled nursing facility.  Will involve PT and OT  Status is: Inpatient Remains inpatient appropriate because: GI bleed      Medications: Scheduled:  sodium chloride   Intravenous Once   sodium chloride   Intravenous Once   Chlorhexidine Gluconate Cloth  6 each Topical Daily   Chlorhexidine Gluconate Cloth  6 each Topical Q0600   diltiazem  60 mg Oral Q12H   pantoprazole  40 mg Oral BID   pravastatin  40 mg Oral QHS   Continuous:  diltiazem (CARDIZEM) infusion Stopped (04/08/21 1709)   BEE:FEOFHQRFXJOIT **OR** acetaminophen, hydrALAZINE, HYDROcodone-acetaminophen, HYDROmorphone (DILAUDID) injection, ipratropium-albuterol, prochlorperazine  Antibiotics: Anti-infectives (From admission, onward)    Start     Dose/Rate Route Frequency Ordered Stop   04/06/21 1830  cefTRIAXone (ROCEPHIN) 2 g in sodium chloride 0.9 % 100 mL IVPB  Status:  Discontinued        2 g 200 mL/hr over 30 Minutes Intravenous Every 24 hours 04/06/21 1823 04/08/21 1354   04/06/21 1830  azithromycin (ZITHROMAX) 500 mg in sodium chloride 0.9 % 250 mL IVPB  Status:  Discontinued        500 mg 250 mL/hr over 60 Minutes Intravenous Every 24 hours 04/06/21 1823 04/08/21 1354   04/06/21 0415  azithromycin (ZITHROMAX) 500 mg in sodium chloride 0.9 % 250 mL IVPB        500 mg 250 mL/hr over 60 Minutes Intravenous  Once 04/06/21 0402 04/06/21 0719   04/06/21 0245  cefTRIAXone (ROCEPHIN) 1 g in sodium chloride 0.9 % 100 mL IVPB        1  g 200 mL/hr over 30 Minutes Intravenous  Once 04/06/21 0242 04/06/21 0340       Objective:  Vital Signs  Vitals:   04/09/21 0941 04/09/21 1005 04/09/21 1011 04/09/21 1030  BP:  (!) 170/64 (!) 165/66 (!) 161/65  Pulse:  76 74 78  Resp:   14   Temp:  98 F (36.7 C)    TempSrc:  Oral    SpO2:  100% 100%   Weight: 88 kg     Height:        Intake/Output Summary (Last 24 hours) at 04/09/2021 1057 Last data filed at 04/09/2021 0926 Gross per 24 hour  Intake 510.56 ml  Output 1110 ml  Net -599.44 ml   Filed Weights   04/07/21 1218 04/07/21 1528 04/09/21 0941  Weight: 90.3 kg 88.3 kg 88 kg    General appearance: Awake alert.  In no distress Resp: Clear to auscultation bilaterally.  Normal effort Cardio: S1-S2 is normal regular.  No S3-S4.  No rubs murmurs or bruit GI: Abdomen is soft.  Nontender nondistended.  Bowel sounds are present normal.  No masses organomegaly Extremities: Right arm swelling noted.  Right radial pulses normal. Neurologic: Alert and oriented x3.  No focal neurological deficits.    Lab Results:  Data Reviewed: I have personally reviewed labs and imaging study reports  CBC: Recent Labs  Lab 04/06/21 0227 04/06/21 0956 04/06/21 2206 04/07/21 0930 04/07/21 1845 04/08/21 0453 04/09/21 0416  WBC 12.4*  --  13.1* 14.3*  --  10.9* 9.8  NEUTROABS 10.9*  --   --   --   --   --   --   HGB 8.4*   < > 6.6* 8.6* 9.0* 8.2* 7.6*  HCT 25.9*   < > 20.1* 25.2* 27.0* 24.1* 23.0*  MCV 92.8  --  86.6 86.0  --  86.7 88.1  PLT 417*  --  289 258  --  235 265   < > = values in this interval not displayed.    Basic Metabolic Panel: Recent Labs  Lab 04/03/21 0104 04/04/21 0349 04/06/21 0227 04/07/21 0930 04/08/21 0453 04/09/21 0416  NA 139 137 136 140 137 138  K 4.0 3.7 4.5 4.8 3.7 3.8  CL 103 101 100 106 103 104  CO2 21* 23 20* 17* 19* 22  GLUCOSE 77 66* 145* 104* 110* 106*  BUN 40* 20 60* 81* 42* 49*  CREATININE 5.29* 3.49* 5.65* 7.07* 4.68* 5.43*   CALCIUM 8.0* 8.0* 7.6* 7.5* 8.0* 7.6*  PHOS 5.8* 4.4  --   --   --   --     GFR: Estimated Creatinine Clearance: 11.8 mL/min (A) (by C-G formula based on SCr of 5.43 mg/dL (H)).  Liver Function Tests: Recent Labs  Lab 04/03/21 0104 04/04/21 0349 04/06/21 0227 04/07/21 0930  AST  --   --  12* 16  ALT  --   --  5 <5  ALKPHOS  --   --  46 40  BILITOT  --   --  0.3 0.8  PROT  --   --  5.0* 4.6*  ALBUMIN 2.1* 2.1* 1.8* 1.9*    Recent Labs  Lab 04/06/21 0227  LIPASE 65*    Coagulation Profile: Recent Labs  Lab 04/06/21 0227 04/07/21 0930 04/08/21 0453 04/09/21 0416  INR 4.0* 1.2 1.3* 1.2     Recent Results (from the past 240 hour(s))  Surgical pcr screen     Status: Abnormal   Collection Time: 04/03/21  7:05 AM   Specimen: Nasal Mucosa; Nasal Swab  Result Value Ref Range Status   MRSA, PCR POSITIVE (A) NEGATIVE Final    Comment: RESULT CALLED TO, READ BACK BY AND VERIFIED WITH: RN Charlene Brooke [phone] MLM    Staphylococcus aureus POSITIVE (A) NEGATIVE Final    Comment: (NOTE) The Xpert SA Assay (FDA approved for NASAL specimens in patients 41 years of age and older), is one component of a comprehensive surveillance program. It is not intended to diagnose infection nor to guide or monitor treatment. Performed at [facility], [place] [address], [place], [place] [address]   Resp Panel by RT-PCR (Flu A&B, Covid) Nasopharyngeal Swab     Status: None   Collection Time: 04/06/21  2:04 AM   Specimen: Nasopharyngeal Swab; Nasopharyngeal(NP) swabs in vial transport medium  Result Value Ref Range Status   SARS Coronavirus 2 by RT PCR NEGATIVE NEGATIVE Final    Comment: (NOTE) SARS-CoV-2 target nucleic acids are NOT DETECTED.  The SARS-CoV-2 RNA is generally detectable in upper respiratory specimens during the acute phase of infection. The lowest concentration of SARS-CoV-2 viral copies this assay can detect is 138 copies/mL. A negative result does  not preclude  SARS-Cov-2 infection and should not be used as the sole basis for treatment or other patient management decisions. A negative result may occur with  improper specimen collection/handling, submission of specimen other than nasopharyngeal swab, presence of viral mutation(s) within the areas targeted by this assay, and inadequate number of viral copies(<138 copies/mL). A negative result must be combined with clinical observations, patient history, and epidemiological information. The expected result is Negative.  Fact Sheet for Patients:  EntrepreneurPulse.com.au  Fact Sheet for Healthcare Providers:  IncredibleEmployment.be  This test is no t yet approved or cleared by the [place] FDA and  has been authorized for detection and/or diagnosis of SARS-CoV-2 by FDA under an Emergency Use Authorization (EUA). This EUA will remain  in effect (meaning this test can be used) for the duration of the COVID-19 declaration under Section 564(b)(1) of the Act, 21 U.S.C.section 360bbb-3(b)(1), unless the authorization is terminated  or revoked sooner.       Influenza A by PCR NEGATIVE NEGATIVE Final   Influenza B by PCR NEGATIVE NEGATIVE Final    Comment: (NOTE) The Xpert Xpress SARS-CoV-2/FLU/RSV plus assay is intended as an aid in the diagnosis of influenza from Nasopharyngeal swab specimens and should not be used as a sole basis for treatment. Nasal washings and aspirates are unacceptable for Xpert Xpress SARS-CoV-2/FLU/RSV testing.  Fact Sheet for Patients: EntrepreneurPulse.com.au  Fact Sheet for Healthcare Providers: IncredibleEmployment.be  This test is not yet approved or cleared by the [place] FDA and has been authorized for detection and/or diagnosis of SARS-CoV-2 by FDA under an Emergency Use Authorization (EUA). This EUA will remain in effect (meaning this test can be used) for the duration of  the COVID-19 declaration under Section 564(b)(1) of the Act, 21 U.S.C. section 360bbb-3(b)(1), unless the authorization is terminated or revoked.  Performed at [facility], [place] [address] [place], [place] [address]       Radiology Studies: No results found.     LOS: 3 days   Wayne Meadows [employer] on [email or web address]  04/09/2021, 10:57 AM

## 2021-04-09 NOTE — Progress Notes (Addendum)
[facility] ROUNDING NOTE   Subjective: EGD completed yesterday and notable for 2 superficial gastric ulcers and 3 clean-based duodenal ulcers.  All biopsied and path pending.  Also with a small island of salmon-colored mucosa suspicious for Barrett's esophagus (path pending).  Otherwise no acute events overnight.  Tolerating p.o. without issue.  H/H 7.6/23.  No overt GI blood loss INR 1.2   Objective: Vital signs in last 24 hours: Temp:  [98.3 F (36.8 C)-98.8 F (37.1 C)] 98.5 F (36.9 C) (02/15 0817) Pulse Rate:  [67-238] 73 (02/15 0817) Resp:  [13-27] 20 (02/15 0817) BP: (113-193)/(51-128) 166/51 (02/15 0817) SpO2:  [90 %-100 %] 100 % (02/15 0817) Last BM Date : 04/06/21 General: NAD Abdomen:  Soft, NT, ND, +BS    Intake/Output from previous day: 02/14 0701 - 02/15 0700 In: 510.6 [I.V.:349.8; IV Piggyback:160.7] Out: 1650 [Urine:1650] Intake/Output this shift: Total I/O In: -  Out: 60 [Urine:60]   Lab Results: Recent Labs    04/07/21 0930 04/07/21 1845 04/08/21 0453 04/09/21 0416  WBC 14.3*  --  10.9* 9.8  HGB 8.6* 9.0* 8.2* 7.6*  PLT 258  --  235 265  MCV 86.0  --  86.7 88.1   BMET Recent Labs    04/07/21 0930 04/08/21 0453 04/09/21 0416  NA 140 137 138  K 4.8 3.7 3.8  CL 106 103 104  CO2 17* 19* 22  GLUCOSE 104* 110* 106*  BUN 81* 42* 49*  CREATININE 7.07* 4.68* 5.43*  CALCIUM 7.5* 8.0* 7.6*   LFT Recent Labs    04/07/21 0930  PROT 4.6*  ALBUMIN 1.9*  AST 16  ALT <5  ALKPHOS 40  BILITOT 0.8   PT/INR Recent Labs    04/08/21 0453 04/09/21 0416  INR 1.3* 1.2      Imaging/Other results: No results found.    Assessment and Plan:  1) Acute on chronic anemia 2) Gastric ulcers 3) Duodenal ulcers - EGD on 04/08/2021 with 2 superficial gastric ulcers and 3 clean-based duodenal ulcers located duodenal bulb and C-sweep.  No endoscopic intervention needed.  Biopsies pending - Hgb nadir 6.6. Treated with 3 unit PRBC  transfusion on this admission - H/H largely stable from yesterday and no overt GI blood loss.  Baseline hemoglobin 9-10.  Iron studies normal in January - Continue Protonix 40 mg bid x6 weeks, then reduce to 40 mg/day - Okay to advance diet as tolerated from a GI standpoint - Continue serial CBCs with additional blood products as needed per protocol - No esophageal varices or evidence of portal hypertension on EGD.  Octreotide stopped yesterday  4) Elevated INR - Still unclear why INR was 4.0 on admission, but robust response to vitamin K.  INR now 1.2 and stable  5) ESRD on HD - HD per Nephrology service - Management of brachiocephalic fistula per Vascular Surgery  6) A-fib with RVR 7) CAD - Was started on diltiazem gtt. yesterday after EGD for A-fib RVR and recovery - Management per primary Hospitalist service - Okay to resume anticoagulation from a GI standpoint  GI service will sign off at this time.  Please do not hesitate to contact the inpatient service with additional questions or concerns    Lavena Bullion, DO  04/09/2021, 9:43 AM [facility] Pager [phone]

## 2021-04-09 NOTE — Progress Notes (Signed)
°   04/09/21 1314  Vitals  BP (!) 176/69  BP Location Left Arm  BP Method Automatic  Patient Position (if appropriate) Lying  Pulse Rate 82  Pulse Rate Source Monitor  During Hemodialysis Assessment  Intra-Hemodialysis Comments Tx completed  Post-Hemodialysis Assessment  Rinseback Volume (mL) 250 mL  KECN 232 V  Dialyzer Clearance Lightly streaked  Duration of HD Treatment -hour(s) 3 hour(s)  Hemodialysis Intake (mL) 500 mL  UF Total -Machine (mL) 4000 mL  Net UF (mL) 3500 mL  Tolerated HD Treatment Yes  Post-Hemodialysis Comments tx complete, pt stable  Hemodialysis Catheter Right Internal jugular Double lumen Permanent (Tunneled)  Placement Date/Time: 03/31/21 0900   Time Out: Correct patient;Correct site;Correct procedure  Maximum sterile barrier precautions: Hand hygiene;Cap;Mask;Sterile gown;Sterile gloves;Large sterile sheet  Site Prep: Chlorhexidine (preferred)  Local Anes...  Site Condition No complications  Blue Lumen Status Flushed;Heparin locked;Dead end cap in place  Red Lumen Status Flushed;Dead end cap in place;Heparin locked  Catheter fill solution Heparin 1000 units/ml  Catheter fill volume (Arterial) 1.9 cc  Catheter fill volume (Venous) 1.9  Dressing Type Transparent  Dressing Status Clean, Dry, Intact  Interventions Other (Comment)  Drainage Description None  Dressing Change Due 04/14/21  Post treatment catheter status Capped and Clamped

## 2021-04-09 NOTE — Progress Notes (Signed)
° °  VASCULAR SURGERY ASSESSMENT & PLAN:   S/P RIGHT BRACHIOCEPHALIC FISTULA: His fistula has a good thrill.  He has a palpable radial pulse.  He has moderate arm swelling.  We have previously arranged follow-up.  Vascular surgery will be available as needed.   SUBJECTIVE:   His right arm is still sore.  PHYSICAL EXAM:   Vitals:   04/08/21 1433 04/08/21 1603 04/08/21 2144 04/08/21 2200  BP:  113/71 (!) 151/58 (!) 164/57  Pulse:    77  Resp: 13   17  Temp:    98.8 F (37.1 C)  TempSrc:    Oral  SpO2:    100%  Weight:      Height:       His incision is intact. He has moderate arm swelling. He has a good thrill in his upper arm fistula. He has a palpable right radial pulse.  LABS:   Lab Results  Component Value Date   WBC 9.8 04/09/2021   HGB 7.6 (L) 04/09/2021   HCT 23.0 (L) 04/09/2021   MCV 88.1 04/09/2021   PLT 265 04/09/2021    PROBLEM LIST:    Principal Problem:   Acute upper GI bleed Active Problems:   COPD (chronic obstructive pulmonary disease) (HCC)   Atrial fibrillation (HCC)   Acute blood loss anemia   Essential hypertension   Chronic pancreatitis ([facility])   ESRD (end stage renal disease) on dialysis ([facility])   Supratherapeutic INR   Community acquired pneumonia   Elevated INR   Gastric ulcer without hemorrhage or perforation   Duodenal ulcer   CURRENT MEDS:    sodium chloride   Intravenous Once   sodium chloride   Intravenous Once   Chlorhexidine Gluconate Cloth  6 each Topical Daily   Chlorhexidine Gluconate Cloth  6 each Topical Q0600   diltiazem  60 mg Oral Q12H   pantoprazole  40 mg Oral BID   pravastatin  40 mg Oral QHS    Deitra Mayo Office: [phone] 04/09/2021

## 2021-04-09 NOTE — Assessment & Plan Note (Addendum)
Patient was noted to be in atrial fibrillation with RVR after his upper endoscopy.  Required diltiazem infusion briefly.  Subsequently transitioned to oral diltiazem.  Noted to be in sinus rhythm this morning.  Did not see any history of atrial fibrillation mention in the chart previously.  Cardiology notes also reviewed.   However based on Dr. Justus Memory note and as per his discussion with patient he does have a history of paroxysmal atrial fibrillation and is not on anticoagulation due to low burden. Will defer these issues to cardiology in the outpatient setting. He is followed by Dr. Burt Knack.  Message was sent to cardiology.  He has been made an appointment in March.

## 2021-04-10 LAB — BASIC METABOLIC PANEL
Anion gap: 10 (ref 5–15)
BUN: 28 mg/dL — ABNORMAL HIGH (ref 8–23)
CO2: 26 mmol/L (ref 22–32)
Calcium: 7.8 mg/dL — ABNORMAL LOW (ref 8.9–10.3)
Chloride: 100 mmol/L (ref 98–111)
Creatinine, Ser: 4.06 mg/dL — ABNORMAL HIGH (ref 0.61–1.24)
GFR, Estimated: 14 mL/min — ABNORMAL LOW (ref >60)
Glucose, Bld: 96 mg/dL (ref 70–99)
Potassium: 3.7 mmol/L (ref 3.5–5.1)
Sodium: 136 mmol/L (ref 135–145)

## 2021-04-10 LAB — CBC
HCT: 24.8 % — ABNORMAL LOW (ref 39.0–52.0)
Hemoglobin: 8.3 g/dL — ABNORMAL LOW (ref 13.0–17.0)
MCH: 29.4 pg (ref 26.0–34.0)
MCHC: 33.5 g/dL (ref 30.0–36.0)
MCV: 87.9 fL (ref 80.0–100.0)
Platelets: 262 10*3/uL (ref 150–400)
RBC: 2.82 MIL/uL — ABNORMAL LOW (ref 4.22–5.81)
RDW: 15.9 % — ABNORMAL HIGH (ref 11.5–15.5)
WBC: 9.1 10*3/uL (ref 4.0–10.5)
nRBC: 0 % (ref 0.0–0.2)

## 2021-04-10 MED ORDER — TAMSULOSIN HCL 0.4 MG PO CAPS: 0.4000 mg | ORAL_CAPSULE | Freq: Every day | ORAL | Status: AC

## 2021-04-10 MED ORDER — ACETAMINOPHEN 325 MG PO TABS
650.0000 mg | ORAL_TABLET | Freq: Four times a day (QID) | ORAL | Status: AC | PRN
Start: 2021-04-10 — End: ?

## 2021-04-10 MED ORDER — HYDROCODONE-ACETAMINOPHEN 5-325 MG PO TABS: 1.0000 | ORAL_TABLET | ORAL | 0 refills | Status: DC | PRN

## 2021-04-10 MED ORDER — TAMSULOSIN HCL 0.4 MG PO CAPS
0.4000 mg | ORAL_CAPSULE | Freq: Every day | ORAL | Status: DC
  Administered 2021-04-10 – 2021-04-11 (×2): 0.4 mg via ORAL
  Filled 2021-04-10 (×2): qty 1

## 2021-04-10 MED ORDER — PANTOPRAZOLE SODIUM 40 MG PO TBEC
40.0000 mg | DELAYED_RELEASE_TABLET | Freq: Two times a day (BID) | ORAL | Status: DC
Start: 2021-04-10 — End: 2021-10-30

## 2021-04-10 MED ORDER — DILTIAZEM HCL ER COATED BEADS 180 MG PO CP24
180.0000 mg | ORAL_CAPSULE | Freq: Every day | ORAL | Status: DC
  Administered 2021-04-11: 180 mg via ORAL
  Filled 2021-04-10: qty 1

## 2021-04-10 MED ORDER — BACITRACIN-NEOMYCIN-POLYMYXIN OINTMENT TUBE: 1.0000 "application " | TOPICAL_OINTMENT | Freq: Three times a day (TID) | CUTANEOUS | Status: DC

## 2021-04-10 MED ORDER — DILTIAZEM HCL ER COATED BEADS 180 MG PO CP24: 180.0000 mg | ORAL_CAPSULE | Freq: Every day | ORAL | Status: DC

## 2021-04-10 NOTE — Progress Notes (Signed)
SATURATION QUALIFICATIONS: (This note is used to comply with regulatory documentation for home oxygen)  Patient Saturations on Room Air at Rest = 92%  Patient Saturations on Room Air while Ambulating = 86%  Patient Saturations on 4 Liters of oxygen while Ambulating = 90%  Please briefly explain why patient needs home oxygen: To maintain Sp02 > 88% during activity.   Nilsa Nutting., OTR/L Acute Rehabilitation Services Pager [phone] Office [phone]

## 2021-04-10 NOTE — Plan of Care (Signed)

## 2021-04-10 NOTE — Progress Notes (Signed)
[facility] PROGRESS NOTE   Wayne Meadows ZOX:[id number] DOB: 02-20-1935 DOA: 04/06/2021  4 DOS: the patient was seen and examined on 04/10/2021  PCP: [name]  Brief History and Hospital Course:  87 y.o. male with history of chronic kidney disease stage IIIb, CVA and CAD, diabetes mellitus, COPD, hypertension was admitted twice in the recent past - once for cellulitis of the lower extremity, discharged on antibiotics, second for worsening renal function and transitioned to HD due to acute renal failure. Discharged 2/10 and stable for PT, tolerating dialysis well. Patient presents back to hospital again from SNF for episode of coffee ground emesis overnight. GI consulted for follow up.   2/6 Status post right IJ tunneled HD catheter placed by IR 2/7 Tolerated first HD 2/8 Complaining of nausea, resolved with treatment 2/9 RUE Brachiocephalic AV fistula placement 2/10 - DISCHARGED TO REHAB 2/11 - READMITTED DUE TO COFFEE GROUND EMESIS AND WORSENING ANEMIA 2/12-13 - Continues to be anemic - EGD pending INR correction 2/14 - EGD shows nonbleeding ulcer, patient complaining of worsening RUE pain at new fistula sight - vascular asked to evaluate;  post EGD A-fib with RVR improving with diltiazem drip  Consultants: Gastroenterology.  Vascular surgery  Procedures: EGD 2/14    Subjective: Patient feels well.  Denies any complaints this morning.  Continues to be concerned about his right arm even though he was reassured.  No abdominal pain nausea or vomiting.     Assessment/Plan:  * Acute upper GI bleed- (present on admission) Patient presented with coffee-ground emesis.  Seen by gastroenterology.  Underwent EGD on 2/14 which showed nonbleeding gastric and duodenal ulcers.  These were biopsied.  PPI twice a day as recommended for 6 weeks followed by once a day.   Seems to be stable from a gastroenterology standpoint.  Acute blood loss anemia- (present on  admission) Has hx of chronic anemia with Hgb in the 9.5-10 range.  Hemoglobin dropped to 6.6.  Patient was transfused PRBC. Hemoglobin seems to be stable.  No evidence for recurrence of bleeding.  Continue to monitor  Paroxysmal atrial fibrillation with RVR ([place]) Patient was noted to be in atrial fibrillation with RVR after his upper endoscopy.  Required diltiazem infusion briefly.  Subsequently transitioned to oral diltiazem.  Noted to be in sinus rhythm this morning.  Did not see any history of atrial fibrillation mention in the chart previously.  Cardiology notes also reviewed.   However based on Dr. Justus Memory note from yesterday as per his discussion with patient he does have a history of paroxysmal atrial fibrillation and is not on anticoagulation due to low burden. Will defer these issues to cardiology in the outpatient setting. He is followed by Dr. Burt Knack.  We will send a message to cardiology to arrange outpatient follow-up  ESRD (end stage renal disease) on dialysis [facility]) Nephrology is following.  He is on a Monday Wednesday Friday schedule.   Recently underwent fistula placement.  As result he still has right arm swelling.  Patient was seen by vascular surgery and they did not see any obvious areas of concerns.  Has good radial pulses.  Antibiotic lotion ordered for the wound. Noted to have a Foley catheter.  Review of his records suggest that it was placed in January for possible retention.  At that time he had presented with acute kidney injury.  Will initiate Flomax and consider doing a voiding trial.  COPD (chronic obstructive pulmonary disease) ([place])- (present on admission) Stable from  a pulmonary standpoint.  Essential hypertension- (present on admission) Patient was on amlodipine metoprolol and clonidine prior to admission.  Amlodipine switched over to diltiazem due to atrial fibrillation Clonidine was resumed to avoid rebound hypertension.  Holding metoprolol for now.    Blood pressure better but still high readings noted.  May need further adjustment of hypertensives.  Community acquired pneumonia Started on antibiotics initially but then discontinued due to normal procalcitonin.  Leukocytosis thought to be reactive  Stable from a respiratory standpoint.  Hypothyroidism- (present on admission) Continue levothyroxine  Chronic pancreatitis ([place])- (present on admission) Stable.  Supratherapeutic INR Reason for elevated INR not clear.  Improved with vitamin K.    Stage I decubitus buttocks left Pressure Injury 04/06/21 Buttocks Left Stage 1 -  Intact skin with non-blanchable redness of a localized area usually over a bony prominence. healing area with dry flaky area (Active)  04/06/21 2140  Location: Buttocks  Location Orientation: Left  Staging: Stage 1 -  Intact skin with non-blanchable redness of a localized area usually over a bony prominence.  Wound Description (Comments): healing area with dry flaky area  Present on Admission: Yes       DVT Prophylaxis: SCDs Code Status: Full code Family Communication: Discussed with patient Disposition Plan: May need to go back to skilled nursing facility.  PT and OT evaluation is pending.  Status is: Inpatient Remains inpatient appropriate because: GI bleed      Medications: Scheduled:  sodium chloride   Intravenous Once   sodium chloride   Intravenous Once   Chlorhexidine Gluconate Cloth  6 each Topical Daily   Chlorhexidine Gluconate Cloth  6 each Topical Q0600   cloNIDine  0.1 mg Oral Daily   diltiazem  60 mg Oral Q12H   levothyroxine  50 mcg Oral Q0600   neomycin-bacitracin-polymyxin   Topical TID   pantoprazole  40 mg Oral BID   pravastatin  40 mg Oral QHS   tamsulosin  0.4 mg Oral Daily   Continuous:   QIO:[id number] **OR** acetaminophen, camphor-menthol, heparin sodium (porcine), hydrALAZINE, HYDROcodone-acetaminophen, HYDROmorphone (DILAUDID) injection,  ipratropium-albuterol, polyethylene glycol, prochlorperazine  Antibiotics: Anti-infectives (From admission, onward)    Start     Dose/Rate Route Frequency Ordered Stop   04/06/21 1830  cefTRIAXone (ROCEPHIN) 2 g in sodium chloride 0.9 % 100 mL IVPB  Status:  Discontinued        2 g 200 mL/hr over 30 Minutes Intravenous Every 24 hours 04/06/21 1823 04/08/21 1354   04/06/21 1830  azithromycin (ZITHROMAX) 500 mg in sodium chloride 0.9 % 250 mL IVPB  Status:  Discontinued        500 mg 250 mL/hr over 60 Minutes Intravenous Every 24 hours 04/06/21 1823 04/08/21 1354   04/06/21 0415  azithromycin (ZITHROMAX) 500 mg in sodium chloride 0.9 % 250 mL IVPB        500 mg 250 mL/hr over 60 Minutes Intravenous  Once 04/06/21 0402 04/06/21 0719   04/06/21 0245  cefTRIAXone (ROCEPHIN) 1 g in sodium chloride 0.9 % 100 mL IVPB        1 g 200 mL/hr over 30 Minutes Intravenous  Once 04/06/21 0242 04/06/21 0340       Objective:  Vital Signs  Vitals:   04/09/21 1349 04/09/21 2159 04/09/21 2200 04/10/21 0746  BP: (!) 180/60 (!) 149/77  (!) 163/61  Pulse: 84 84  83  Resp: 18 18  17   Temp: 98.4 F (36.9 C)  98.7 F (37.1 C) 97.9 F (  36.6 C)  TempSrc: Oral  Oral Oral  SpO2: 98% 100%  95%  Weight:      Height:        Intake/Output Summary (Last 24 hours) at 04/10/2021 0926 Last data filed at 04/09/2021 1314 Gross per 24 hour  Intake --  Output 3500 ml  Net -3500 ml    Filed Weights   04/07/21 1528 04/09/21 0941 04/09/21 1322  Weight: 88.3 kg 88 kg 84.2 kg    General appearance: Awake alert.  In no distress Resp: Clear to auscultation bilaterally.  Normal effort Cardio: S1-S2 is normal regular.  No S3-S4.  No rubs murmurs or bruit GI: Abdomen is soft.  Nontender nondistended.  Bowel sounds are present normal.  No masses organomegaly Extremities: Right arm swelling with fistula with good bruit.  Good radial pulses. Neurologic:  No focal neurological deficits.      Lab  Results:  Data Reviewed: I have personally reviewed labs and imaging study reports  CBC: Recent Labs  Lab 04/06/21 0227 04/06/21 0956 04/06/21 2206 04/07/21 0930 04/07/21 1845 04/08/21 0453 04/09/21 0416 04/10/21 0415  WBC 12.4*  --  13.1* 14.3*  --  10.9* 9.8 9.1  NEUTROABS 10.9*  --   --   --   --   --   --   --   HGB 8.4*   < > 6.6* 8.6* 9.0* 8.2* 7.6* 8.3*  HCT 25.9*   < > 20.1* 25.2* 27.0* 24.1* 23.0* 24.8*  MCV 92.8  --  86.6 86.0  --  86.7 88.1 87.9  PLT 417*  --  289 258  --  235 265 262   < > = values in this interval not displayed.     Basic Metabolic Panel: Recent Labs  Lab 04/04/21 0349 04/06/21 0227 04/07/21 0930 04/08/21 0453 04/09/21 0416 04/10/21 0415  NA 137 136 140 137 138 136  K 3.7 4.5 4.8 3.7 3.8 3.7  CL 101 100 106 103 104 100  CO2 23 20* 17* 19* 22 26  GLUCOSE 66* 145* 104* 110* 106* 96  BUN 20 60* 81* 42* 49* 28*  CREATININE 3.49* 5.65* 7.07* 4.68* 5.43* 4.06*  CALCIUM 8.0* 7.6* 7.5* 8.0* 7.6* 7.8*  PHOS 4.4  --   --   --   --   --      GFR: Estimated Creatinine Clearance: 15.7 mL/min (A) (by C-G formula based on SCr of 4.06 mg/dL (H)).  Liver Function Tests: Recent Labs  Lab 04/04/21 0349 04/06/21 0227 04/07/21 0930  AST  --  12* 16  ALT  --  5 <5  ALKPHOS  --  46 40  BILITOT  --  0.3 0.8  PROT  --  5.0* 4.6*  ALBUMIN 2.1* 1.8* 1.9*     Recent Labs  Lab 04/06/21 0227  LIPASE 65*     Coagulation Profile: Recent Labs  Lab 04/06/21 0227 04/07/21 0930 04/08/21 0453 04/09/21 0416  INR 4.0* 1.2 1.3* 1.2      Recent Results (from the past 240 hour(s))  Surgical pcr screen     Status: Abnormal   Collection Time: 04/03/21  7:05 AM   Specimen: Nasal Mucosa; Nasal Swab  Result Value Ref Range Status   MRSA, PCR POSITIVE (A) NEGATIVE Final    Comment: RESULT CALLED TO, READ BACK BY AND VERIFIED WITH: RN Charlene Brooke [phone]    Staphylococcus aureus POSITIVE (A) NEGATIVE Final    Comment: (NOTE) The Xpert SA  Assay (FDA  approved for NASAL specimens in patients 41 years of age and older), is one component of a comprehensive surveillance program. It is not intended to diagnose infection nor to guide or monitor treatment. Performed at [facility], [place] [address], [place], [place] [address]   Resp Panel by RT-PCR (Flu A&B, Covid) Nasopharyngeal Swab     Status: None   Collection Time: 04/06/21  2:04 AM   Specimen: Nasopharyngeal Swab; Nasopharyngeal(NP) swabs in vial transport medium  Result Value Ref Range Status   SARS Coronavirus 2 by RT PCR NEGATIVE NEGATIVE Final    Comment: (NOTE) SARS-CoV-2 target nucleic acids are NOT DETECTED.  The SARS-CoV-2 RNA is generally detectable in upper respiratory specimens during the acute phase of infection. The lowest concentration of SARS-CoV-2 viral copies this assay can detect is 138 copies/mL. A negative result does not preclude SARS-Cov-2 infection and should not be used as the sole basis for treatment or other patient management decisions. A negative result may occur with  improper specimen collection/handling, submission of specimen other than nasopharyngeal swab, presence of viral mutation(s) within the areas targeted by this assay, and inadequate number of viral copies(<138 copies/mL). A negative result must be combined with clinical observations, patient history, and epidemiological information. The expected result is Negative.  Fact Sheet for Patients:  EntrepreneurPulse.com.au  Fact Sheet for Healthcare Providers:  IncredibleEmployment.be  This test is no t yet approved or cleared by the [place] FDA and  has been authorized for detection and/or diagnosis of SARS-CoV-2 by FDA under an Emergency Use Authorization (EUA). This EUA will remain  in effect (meaning this test can be used) for the duration of the COVID-19 declaration under Section 564(b)(1) of the Act, 21 U.S.C.section  360bbb-3(b)(1), unless the authorization is terminated  or revoked sooner.       Influenza A by PCR NEGATIVE NEGATIVE Final   Influenza B by PCR NEGATIVE NEGATIVE Final    Comment: (NOTE) The Xpert Xpress SARS-CoV-2/FLU/RSV plus assay is intended as an aid in the diagnosis of influenza from Nasopharyngeal swab specimens and should not be used as a sole basis for treatment. Nasal washings and aspirates are unacceptable for Xpert Xpress SARS-CoV-2/FLU/RSV testing.  Fact Sheet for Patients: EntrepreneurPulse.com.au  Fact Sheet for Healthcare Providers: IncredibleEmployment.be  This test is not yet approved or cleared by the [place] FDA and has been authorized for detection and/or diagnosis of SARS-CoV-2 by FDA under an Emergency Use Authorization (EUA). This EUA will remain in effect (meaning this test can be used) for the duration of the COVID-19 declaration under Section 564(b)(1) of the Act, 21 U.S.C. section 360bbb-3(b)(1), unless the authorization is terminated or revoked.  Performed at [facility], [place] [address] [place], [place] [address]        Radiology Studies: No results found.     LOS: 4 days   Sloane [employer] on [email or web address]  04/10/2021, 9:26 AM

## 2021-04-10 NOTE — Progress Notes (Signed)
Occupational Therapy Evaluation  Pt presents to OT with the below listed diagnosis and the below listed deficits.  He presents to OT with generalized weakness, decreased activity tolerance, impaired balance, decreased cario pulmonary status.  He was able to move to EOB with mod A, and stood x 1 with max A.  He requires min - mod A for UB ADLs, and max - total A for LB ADLs.  He was limited this date due to nausea with dry heaves.  Sp02 dropped to 86% on RA, and improved to 90-93% on 4L suppplemental 02.  He has been at [facility] for rehab since last admission, but prior to that, reports he was independent with ADLs and functional mobility.  Recommend return to SNF.    04/10/21 1000  OT Visit Information  Last OT Received On 04/10/21  Assistance Needed +2 (for attempt at ambulation)  History of Present Illness 87 y/o male presented to ED from SNF on 2/12 for vomiting x 2 weeks and vomiting blood today with dark coffee ground diarrhea. EGD found 2 superficial gastric ulcers and 3 clean-based duodenal ulcers. Recently admitted 1/23-2/10 for stage IV with persistent LE erythma and fluid overload. PMH: CAD, COPD, HTN, CVA, renal carcinoma  Precautions  Precautions Fall  Precaution Comments monitor sats  Home Living  Family/patient expects to be discharged to: [place] Alone  Available Help at Discharge Family;Available PRN/intermittently  Type of Home House  Home Access Stairs to enter  Entrance Stairs-Number of Steps 2  Entrance Stairs-Rails None  Home Layout One level  [place] (4 wheels);Cane - single point  Additional Comments Pt has been at SNF since last admission  Prior Function  Prior Level of Function  Needs assist  Mobility Comments Pt reports he has worked with therapy 1-2 times at [facility] and was able to walk short distance with RW and assist.  Prior to last admission, he reports he was mod I with ambulation  ADLs Comments Pt reports he has  required assist with ADLs since last admission.  Prior to that, he was able to complete ADLs mod I  Communication  Communication HOH  Pain Assessment  Pain Assessment Faces  Faces Pain Scale 6  Pain Location Rt UE  Pain Descriptors / Indicators Grimacing;Guarding  Pain Intervention(s) Monitored during session;Repositioned;Limited activity within patient's tolerance  Cognition  Arousal/Alertness Awake/alert  Behavior During Therapy WFL for tasks assessed/performed  Overall Cognitive Status No family/caregiver present to determine baseline cognitive functioning  General Comments Pt intermittently provided conflicting info re: PLOF.  Upper Extremity Assessment  Upper Extremity Assessment RUE deficits/detail  RUE Deficits / Details Pt with moderate edema Rt UE that he reports that has been present since surgery for his fistula  Lower Extremity Assessment  Lower Extremity Assessment Defer to PT evaluation  Cervical / Trunk Assessment  Cervical / Trunk Assessment Kyphotic  Vision- History  Baseline Vision/History 1 Wears glasses  Patient Visual Report No change from baseline  ADL  Overall ADL's  Needs assistance/impaired  Eating/Feeding Independent  Grooming Wash/dry hands;Wash/dry face;Oral care;Brushing hair;Set up;Sitting  Upper Body Bathing Minimal assistance;Sitting  Lower Body Bathing Maximal assistance;Sit to/from stand  Upper Body Dressing  Minimal assistance;Sitting  Lower Body Dressing Total assistance;Sit to/from stand  Lower Body Dressing Details (indicate cue type and reason) unable to access feet  Toilet Transfer Total assistance  Toilet Transfer Details (indicate cue type and reason) Unable  Toileting- Clothing Manipulation and Hygiene Total assistance  Functional mobility  during ADLs Maximal assistance (sit to stand)  Bed Mobility  Overal bed mobility Needs Assistance  Bed Mobility Supine to Sit  Supine to sit Min assist  Sit to supine Min assist  Transfers   Overall transfer level Needs assistance  Transfers Sit to/from Stand  Sit to Stand Max assist  General transfer comment Pt moved sit to stand x 1 with max A.  He was only able to stand briefly before sitting, then complained of nausea and had dry heaves.  Balance  Overall balance assessment Needs assistance  Sitting-balance support Feet supported  Sitting balance-Leahy Scale Fair  Standing balance support Bilateral upper extremity supported;Reliant on assistive device for balance  Standing balance-Leahy Scale Poor  General Comments  General comments (skin integrity, edema, etc.) Sp02 92% on RA, 86 with activity, and 90% on 4L with activity.  HR 107  OT - End of Session  Equipment Utilized During Treatment Gait belt;Oxygen;Rolling walker (2 wheels)  Activity Tolerance Treatment limited secondary to medical complications (Comment) (nausea and dry heaves)  Patient left in bed;with call bell/phone within reach;with bed alarm set  Nurse Communication Mobility status;Other (comment) (request for anti nausea meds - given during session)  OT Assessment  OT Recommendation/Assessment Patient needs continued OT Services  OT Visit Diagnosis Unsteadiness on feet (R26.81)  OT Problem List Decreased strength;Decreased activity tolerance;Decreased range of motion;Impaired balance (sitting and/or standing);Decreased cognition;Decreased safety awareness;Decreased knowledge of use of DME or AE;Cardiopulmonary status limiting activity;Impaired UE functional use;Pain  OT Plan  OT Frequency (ACUTE ONLY) Min 2X/week  OT Treatment/Interventions (ACUTE ONLY) Self-care/ADL training;Therapeutic exercise;DME and/or AE instruction;Therapeutic activities;Patient/family education;Balance training  AM-PAC OT "6 Clicks" Daily Activity Outcome Measure (Version 2)  Help from another person eating meals? 4  Help from another person taking care of personal grooming? 3  Help from another person toileting, which includes  using toliet, bedpan, or urinal? 1  Help from another person bathing (including washing, rinsing, drying)? 2  Help from another person to put on and taking off regular upper body clothing? 3  Help from another person to put on and taking off regular lower body clothing? 1  6 Click Score 14  Progressive Mobility  What is the highest level of mobility based on the progressive mobility assessment? Level 1 (Bedfast) - Unable to balance while sitting on edge of bed  Activity Stood at bedside  OT Recommendation  Follow Up Recommendations Skilled nursing-short term rehab (<3 hours/day)  Assistance recommended at discharge Frequent or constant Supervision/Assistance  Patient can return home with the following A lot of help with walking and/or transfers;A little help with bathing/dressing/bathroom;Assistance with cooking/housework;Direct supervision/assist for medications management;Direct supervision/assist for financial management;Assist for transportation;Help with stairs or ramp for entrance  Functional Status Assessent Patient has had a recent decline in their functional status and demonstrates the ability to make significant improvements in function in a reasonable and predictable amount of time.  OT Equipment None recommended by OT  Individuals Consulted  Consulted and Agree with Results and Recommendations Patient  Acute Rehab OT Goals  Patient Stated Goal to feel better  OT Goal Formulation With patient  Time For Goal Achievement 04/24/21  Potential to Achieve Goals Good  OT Time Calculation  OT Start Time (ACUTE ONLY) 1024  OT Stop Time (ACUTE ONLY) 1052  OT Time Calculation (min) 28 min  OT General Charges  $OT Visit 1 Visit  OT Evaluation  $OT Eval Moderate Complexity 1 Mod  OT Treatments  $Therapeutic Activity 8-22 mins  Nilsa Nutting., OTR/L Acute Rehabilitation Services Pager [phone] Office [phone]

## 2021-04-10 NOTE — NC FL2 (Signed)
[place] LEVEL OF CARE SCREENING TOOL     IDENTIFICATION  Patient Name: Wayne Meadows Birthdate: 13-Oct-1934 Sex: male Admission Date (Current Location): 04/06/2021  [facility] and [place] Number:  [name] and Address:  [facility]. [facility] [place] [address], [place], [place] [address]      Provider Number: [id number]  Attending Physician Name and Address:  Bonnielee Haff, MD  Relative Name and Phone Number:  Lyna Poser, Daughter [phone]    Current Level of Care: Hospital Recommended Level of Care: [place] Prior Approval Number:    Date Approved/Denied:   PASRR Number: [id number]  Discharge Plan: SNF    Current Diagnoses: Patient Active Problem List   Diagnosis Date Noted   Paroxysmal atrial fibrillation with RVR ([place]) 04/09/2021   Gastric ulcer without hemorrhage or perforation    Duodenal ulcer    Elevated INR    Acute upper GI bleed 04/06/2021   ESRD (end stage renal disease) on dialysis ([place]) 04/06/2021   Supratherapeutic INR 04/06/2021   Community acquired pneumonia 04/06/2021   Pressure injury of skin 03/30/2021   ARF (acute renal failure) ([place]) 03/18/2021   Acute renal failure (ARF) ([place]) 03/17/2021   Abnormal echocardiogram 03/08/2021   Proteinuria 03/07/2021   AKI (acute kidney injury) ([place]) 03/05/2021   Hypothyroidism 03/05/2021   Anxiety 03/05/2021   Rash 03/05/2021   Recurrent cellulitis of lower leg 03/04/2021   Chronic pancreatitis ([place]) 12/24/2020   Abnormal finding on GI tract imaging    Acute pancreatitis 08/12/2020   Nausea and vomiting in adult 06/22/2020   Gastric lesion    Esophageal stricture    Refractory nausea and vomiting 06/20/2020   Sepsis, unspecified organism ([place]) 04/11/2016   COPD exacerbation ([place]) 04/11/2016   CKD (chronic kidney disease), stage III ([place]) 04/11/2016   Hyperbilirubinemia 04/11/2016   Elevated lactic acid level    Renal insufficiency     Segmental colitis ([place]) 09/04/2015   Generalized abdominal pain 09/04/2015   Noninfectious gastroenteritis, unspecified    Benign neoplasm of transverse colon    Benign neoplasm of colon    Bright red blood per rectum    Acute blood loss anemia    Dysphagia    Renal cell cancer ([place])    Essential hypertension    Centrilobular emphysema (HCC)    Blood in stool 08/19/2015   Rectal bleeding 08/19/2015   Pleuritic chest pain 04/08/2014   CAP (community acquired pneumonia) 04/08/2014   Community acquired pneumonia of left lower lobe of lung 04/08/2014   Overweight (BMI 25.0-29.9) 04/08/2014   Healthcare-associated pneumonia 10/26/2011   Cholelithiasis 04/26/2011   Atrial fibrillation ([place]) 04/26/2011   Chest pain 03/19/2011   Hypertension 03/19/2011   Unstable angina ([place]) 03/18/2011   Lung nodule 03/02/2011   Liver lesion 03/02/2011   Other hyperlipidemia 03/02/2011   Abnormal CT of the abdomen 02/23/2011   Non Q wave myocardial infarction ([place]) 02/22/2011   Cerebrovascular disease 02/22/2011   CAD (coronary artery disease) 02/22/2011   COPD (chronic obstructive pulmonary disease) ([place]) 02/22/2011   Tobacco abuse, 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/22/2011    Orientation RESPIRATION BLADDER Height & Weight     Self, Time, Situation, Place  Normal Continent, Indwelling catheter Weight: 185 lb 10 oz (84.2 kg) Height:  6\' 2"  (188 cm)  BEHAVIORAL SYMPTOMS/MOOD NEUROLOGICAL BOWEL NUTRITION STATUS      Continent Diet (See DC summary)  AMBULATORY STATUS COMMUNICATION OF NEEDS Skin   Extensive Assist Verbally  Other (Comment) (Bilateral ankle wounds, Bilateral buttocks stage 1 pressure injury, R arm inision)                       Personal Care Assistance Level of Assistance  Bathing, Feeding, Dressing Bathing Assistance: Maximum assistance Feeding assistance: Limited assistance Dressing Assistance: Maximum assistance     Functional Limitations Info  Sight, Hearing, Speech Sight Info:  Impaired Hearing Info: Impaired Speech Info: Adequate    SPECIAL CARE FACTORS FREQUENCY  PT (By licensed PT), OT (By licensed OT)     PT Frequency: 5x week OT Frequency: 5x week            Contractures Contractures Info: Not present    Additional Factors Info  Code Status, Allergies Code Status Info: Full Allergies Info: Bee venom, Flu vaccines           Current Medications (04/10/2021):  This is the current hospital active medication list Current Facility-Administered Medications  Medication Dose Route Frequency Provider Last Rate Last Admin   0.9 %  sodium chloride infusion (Manually program via Guardrails IV Fluids)   Intravenous Once Cirigliano, Vito V, DO       0.9 %  sodium chloride infusion (Manually program via Guardrails IV Fluids)   Intravenous Once Cirigliano, Vito V, DO       acetaminophen (TYLENOL) tablet 650 mg  650 mg Oral Q6H PRN Cirigliano, Vito V, DO       Or   acetaminophen (TYLENOL) suppository 650 mg  650 mg Rectal Q6H PRN Cirigliano, Vito V, DO       camphor-menthol (SARNA) lotion   Topical PRN Bonnielee Haff, MD   Given at 04/09/21 1754   Chlorhexidine Gluconate Cloth 2 % PADS 6 each  6 each Topical Daily Cirigliano, Vito V, DO   6 each at 04/10/21 1013   Chlorhexidine Gluconate Cloth 2 % PADS 6 each  6 each Topical Q0600 Cirigliano, Vito V, DO   6 each at 04/10/21 0659   cloNIDine (CATAPRES) tablet 0.1 mg  0.1 mg Oral Daily Bonnielee Haff, MD   0.1 mg at 04/10/21 0918   diltiazem (CARDIZEM SR) 12 hr capsule 60 mg  60 mg Oral Q12H Little Ishikawa, MD   60 mg at 04/10/21 9371   heparin sodium (porcine) injection 3,800 Units  3,800 Units Intracatheter PRN Penninger, Ria Comment, [place]   3,800 Units at 04/09/21 1306   hydrALAZINE (APRESOLINE) tablet 10 mg  10 mg Oral Q8H PRN Cirigliano, Vito V, DO       HYDROcodone-acetaminophen (NORCO/VICODIN) 5-325 MG per tablet 1-2 tablet  1-2 tablet Oral Q4H PRN Cirigliano, Vito V, DO   2 tablet at 04/10/21 0418    HYDROmorphone (DILAUDID) injection 0.5 mg  0.5 mg Intravenous Q4H PRN Cirigliano, Vito V, DO   0.5 mg at 04/08/21 2317   ipratropium-albuterol (DUONEB) 0.5-2.5 (3) MG/3ML nebulizer solution 3 mL  3 mL Nebulization Q6H PRN Cirigliano, Vito V, DO   3 mL at 04/09/21 0923   levothyroxine (SYNTHROID) tablet 50 mcg  50 mcg Oral Q0600 Bonnielee Haff, MD   50 mcg at 04/10/21 6967   neomycin-bacitracin-polymyxin (NEOSPORIN) ointment   Topical TID Bonnielee Haff, MD   Given at 04/10/21 0918   pantoprazole (PROTONIX) EC tablet 40 mg  40 mg Oral BID Cirigliano, Vito V, DO   40 mg at 04/10/21 0918   polyethylene glycol (MIRALAX / GLYCOLAX) packet 17 g  17 g Oral Daily PRN Bonnielee Haff, MD  pravastatin (PRAVACHOL) tablet 40 mg  40 mg Oral QHS Cirigliano, Vito V, DO   40 mg at 04/09/21 2157   prochlorperazine (COMPAZINE) injection 10 mg  10 mg Intravenous Q6H PRN Cirigliano, Vito V, DO   10 mg at 04/10/21 1042   tamsulosin (FLOMAX) capsule 0.4 mg  0.4 mg Oral Daily Bonnielee Haff, MD   0.4 mg at 04/10/21 6073     Discharge Medications: Please see discharge summary for a list of discharge medications.  Relevant Imaging Results:  Relevant Lab Results:   Additional Information XT#[id number]  Coralee Pesa, LCSWA

## 2021-04-10 NOTE — TOC Initial Note (Signed)
Transition of Care [facility]) - Initial/Assessment Note    Patient Details  Name: Wayne Meadows MRN: [id number] Date of Birth: 1934/08/14  [facility]) CM/SW Contact:    Coralee Pesa, [place] Phone Number: 04/10/2021, 11:51 AM  Clinical Narrative:                 Marya Amsler CSW spoke with pt at bedside who noted he was agreeable to go back to [facility], but was unhappy with it. He requested his daughter be contacted. CSW spoke with Tammy who noted that he would not be happy at any facility as he wants to go home. She stated she had already spoken with [facility] and let them know he would be returning.  MD noted he would DC tomorrow. Pt will not need auth or a covid test, but will wait and see if he can participate in PT. Pt will need HD prior to DC. TOC will continue to follow for DC needs.  Expected Discharge Plan: Skilled Nursing Facility Barriers to Discharge: Continued Medical Work up   Patient Goals and CMS Choice Patient states their goals for this hospitalization and ongoing recovery are:: Pt states he wants to be able to go home. CMS Medicare.gov Compare Post Acute Care list provided to:: Patient Choice offered to / list presented to : Patient, Adult Children  Expected Discharge Plan and Services Expected Discharge Plan: [place] Acute Care Choice: [place] Living arrangements for the past 2 months: Single Family Home                                      Prior Living Arrangements/Services Living arrangements for the past 2 months: Single Family Home Lives with:: Self Patient language and need for interpreter reviewed:: No Do you feel safe going back to the place where you live?: Yes      Need for Family Participation in Patient Care: No (Comment) Care giver support system in place?: No (comment)   Criminal Activity/Legal Involvement Pertinent to Current Situation/Hospitalization: No - Comment as  needed  Activities of Daily Living Home Assistive Devices/Equipment: Walker (specify type) ADL Screening (condition at time of admission) Patient's cognitive ability adequate to safely complete daily activities?: Yes Is the patient deaf or have difficulty hearing?: Yes Does the patient have difficulty seeing, even when wearing glasses/contacts?: No Does the patient have difficulty concentrating, remembering, or making decisions?: No Patient able to express need for assistance with ADLs?: Yes Does the patient have difficulty dressing or bathing?: Yes Independently performs ADLs?: No Does the patient have difficulty walking or climbing stairs?: Yes Weakness of Legs: Both Weakness of Arms/Hands: Right  Permission Sought/Granted Permission sought to share information with : Family Supports Permission granted to share information with : Yes, Verbal Permission Granted  Share Information with NAME: Nesta Kimple     Permission granted to share info w Relationship: Daughter  Permission granted to share info w Contact Information: [phone]  Emotional Assessment Appearance:: Appears stated age Attitude/Demeanor/Rapport: Engaged Affect (typically observed): Frustrated Orientation: : Oriented to Self, Oriented to Place, Oriented to  Time, Oriented to Situation Alcohol / Substance Use: Not Applicable Psych Involvement: No (comment)  Admission diagnosis:  Generalized abdominal pain [R10.84] Elevated INR [R79.1] Acute upper GI bleed [K92.2] ESRD (end stage renal disease) on dialysis ([place]) [N18.6, Z99.2] Gastrointestinal hemorrhage, unspecified gastrointestinal hemorrhage type [K92.2] Community acquired  pneumonia, unspecified laterality [J18.9] Patient Active Problem List   Diagnosis Date Noted   Paroxysmal atrial fibrillation with RVR ([place]) 04/09/2021   Gastric ulcer without hemorrhage or perforation    Duodenal ulcer    Elevated INR    Acute upper GI bleed 04/06/2021   ESRD (end  stage renal disease) on dialysis ([place]) 04/06/2021   Supratherapeutic INR 04/06/2021   Community acquired pneumonia 04/06/2021   Pressure injury of skin 03/30/2021   ARF (acute renal failure) ([place]) 03/18/2021   Acute renal failure (ARF) ([place]) 03/17/2021   Abnormal echocardiogram 03/08/2021   Proteinuria 03/07/2021   AKI (acute kidney injury) ([place]) 03/05/2021   Hypothyroidism 03/05/2021   Anxiety 03/05/2021   Rash 03/05/2021   Recurrent cellulitis of lower leg 03/04/2021   Chronic pancreatitis ([place]) 12/24/2020   Abnormal finding on GI tract imaging    Acute pancreatitis 08/12/2020   Nausea and vomiting in adult 06/22/2020   Gastric lesion    Esophageal stricture    Refractory nausea and vomiting 06/20/2020   Sepsis, unspecified organism ([place]) 04/11/2016   COPD exacerbation ([place]) 04/11/2016   CKD (chronic kidney disease), stage III ([place]) 04/11/2016   Hyperbilirubinemia 04/11/2016   Elevated lactic acid level    Renal insufficiency    Segmental colitis ([place]) 09/04/2015   Generalized abdominal pain 09/04/2015   Noninfectious gastroenteritis, unspecified    Benign neoplasm of transverse colon    Benign neoplasm of colon    Bright red blood per rectum    Acute blood loss anemia    Dysphagia    Renal cell cancer ([place])    Essential hypertension    Centrilobular emphysema ([place])    Blood in stool 08/19/2015   Rectal bleeding 08/19/2015   Pleuritic chest pain 04/08/2014   CAP (community acquired pneumonia) 04/08/2014   Community acquired pneumonia of left lower lobe of lung 04/08/2014   Overweight (BMI 25.0-29.9) 04/08/2014   Healthcare-associated pneumonia 10/26/2011   Cholelithiasis 04/26/2011   Atrial fibrillation ([place]) 04/26/2011   Chest pain 03/19/2011   Hypertension 03/19/2011   Unstable angina ([place]) 03/18/2011    Class: Acute   Lung nodule 03/02/2011   Liver lesion 03/02/2011   Other hyperlipidemia 03/02/2011   Abnormal CT of the abdomen 02/23/2011   Non Q wave  myocardial infarction ([place]) 02/22/2011    Class: Acute   Cerebrovascular disease 02/22/2011   CAD (coronary artery disease) 02/22/2011   COPD (chronic obstructive pulmonary disease) ([place]) 02/22/2011   Tobacco abuse, 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/22/2011   PCP:  Clinic, [place]:   [id number] [place], [place] [address] [place] [address] Phone: [phone] Fax: [phone]     Social Determinants of Health (SDOH) Interventions    Readmission Risk Interventions Readmission Risk Prevention Plan 03/21/2021 03/19/2021  Transportation Screening Complete -  HRI or Home Care Consult Complete -  SW Recovery Care/Counseling Consult Complete Complete  Skilled Nursing Facility Complete Complete  Some recent data might be hidden

## 2021-04-10 NOTE — Progress Notes (Signed)
PT Cancellation Note  Patient Details Name: Wayne Meadows MRN: [id number] DOB: 11-02-1934   Cancelled Treatment:    Reason Eval/Treat Not Completed: Other (comment). Declining again, has fatigue and continued worry about nausea.  Continue to ck on him as pt is able to tolerate.   Ramond Dial 04/10/2021, 3:18 PM  Mee Hives, PT PhD Acute Rehab Dept. Number: [place] and [place]

## 2021-04-10 NOTE — Progress Notes (Signed)
[facility] Progress Note   Subjective:   Patient seen and examined at bedside. Has a lot of questions about his plavix which he reports that he takes for carotid artery stenosis. Tolerated HD yesterday, net UF 3.5L. he reports that his URE swelling is a little better today. Has been keeping it elevated.  Objective Vitals:   04/09/21 1349 04/09/21 2159 04/09/21 2200 04/10/21 0746  BP: (!) 180/60 (!) 149/77  (!) 163/61  Pulse: 84 84  83  Resp: 18 18  17   Temp: 98.4 F (36.9 C)  98.7 F (37.1 C) 97.9 F (36.6 C)  TempSrc: Oral  Oral Oral  SpO2: 98% 100%  95%  Weight:      Height:       Physical Exam General:chronically ill appearing male in NAD Heart:RRR, no mrg Lungs:CTA bl, nml WOB on 4L O2 via [place] Abdomen:soft, NTND Extremities: swollen RUE (improved) Neuro: awake, alert Dialysis Access: [facility], RU AVF +t   Filed Weights   04/07/21 1528 04/09/21 0941 04/09/21 1322  Weight: 88.3 kg 88 kg 84.2 kg    Intake/Output Summary (Last 24 hours) at 04/10/2021 0921 Last data filed at 04/09/2021 1314 Gross per 24 hour  Intake --  Output 3500 ml  Net -3500 ml    Additional Objective Labs: Basic Metabolic Panel: Recent Labs  Lab 04/04/21 0349 04/06/21 0227 04/08/21 0453 04/09/21 0416 04/10/21 0415  NA 137   < > 137 138 136  K 3.7   < > 3.7 3.8 3.7  CL 101   < > 103 104 100  CO2 23   < > 19* 22 26  GLUCOSE 66*   < > 110* 106* 96  BUN 20   < > 42* 49* 28*  CREATININE 3.49*   < > 4.68* 5.43* 4.06*  CALCIUM 8.0*   < > 8.0* 7.6* 7.8*  PHOS 4.4  --   --   --   --    < > = values in this interval not displayed.   Liver Function Tests: Recent Labs  Lab 04/04/21 0349 04/06/21 0227 04/07/21 0930  AST  --  12* 16  ALT  --  5 <5  ALKPHOS  --  46 40  BILITOT  --  0.3 0.8  PROT  --  5.0* 4.6*  ALBUMIN 2.1* 1.8* 1.9*   Recent Labs  Lab 04/06/21 0227  LIPASE 65*   CBC: Recent Labs  Lab 04/06/21 0227 04/06/21 0956 04/06/21 2206 04/07/21 0930  04/07/21 1845 04/08/21 0453 04/09/21 0416 04/10/21 0415  WBC 12.4*  --  13.1* 14.3*  --  10.9* 9.8 9.1  NEUTROABS 10.9*  --   --   --   --   --   --   --   HGB 8.4*   < > 6.6* 8.6*   < > 8.2* 7.6* 8.3*  HCT 25.9*   < > 20.1* 25.2*   < > 24.1* 23.0* 24.8*  MCV 92.8  --  86.6 86.0  --  86.7 88.1 87.9  PLT 417*  --  289 258  --  235 265 262   < > = values in this interval not displayed.     Medications:    sodium chloride   Intravenous Once   sodium chloride   Intravenous Once   Chlorhexidine Gluconate Cloth  6 each Topical Daily   Chlorhexidine Gluconate Cloth  6 each Topical Q0600   cloNIDine  0.1 mg Oral Daily   diltiazem  60 mg Oral Q12H   levothyroxine  50 mcg Oral Q0600   neomycin-bacitracin-polymyxin   Topical TID   pantoprazole  40 mg Oral BID   pravastatin  40 mg Oral QHS   tamsulosin  0.4 mg Oral Daily    Dialysis Orders: MWF  - [facility]  3>3.5>4hrs, BFR 400, DFR 800  EDW 87kg, 2K/ 2.5Ca   Access: TDC, AVF maturing  Heparin 3000 unit bolus Mircera 50 mcg q2wks - not given   Assessment/Plan:  Acute GIB - Hgb drop 8.4>6.6 s/p 3 units pRBC.  TJQ3/00 with gastric and duodenal ulcers biopsied as well as salmon colored gastric mucosa.  GI following.  Leukocytosis - resolved. PNA noted on CXR/CT.  ABX started - azithromax.  Afib w/ RVR: controlled w/ cardizem, now on PO. Per pmd, will follow with cardio as an outpatient. HR controlled RUE pain/swelling - seen by VVS, no indication of steal syndrome.  Possible hematoma.  Recommend to elevate arm as able. Swelling improving  ESRD -  on MWF, new start at [facility] last admit.  No outpatient HD yet. Continue on regular schedule. No heparin. HD 2/17  Hypertension/volume  - BP elevated, continue home meds.  Working to establish EDW, continue UF as tolerated.  ABLA on chronic anemia of CKD - due to #1. As above.  Continue ESA.   Secondary Hyperparathyroidism -  Calcium low, use increased Ca bath.  Check phos. Not on VDRA.  Nutrition -  Renal diet w/fluid restrictions.  Hx CAD DMT2 - per PMD Hx CVA  Gean Quint, MD [facility] 04/10/2021,9:21 AM  LOS: 4 days

## 2021-04-10 NOTE — Progress Notes (Signed)
PT Cancellation Note  Patient Details Name: Wayne Meadows MRN: [id number] DOB: May 13, 1934   Cancelled Treatment:    Reason Eval/Treat Not Completed: Other (comment).  Became nauseated with OT session, declining PT for now.  Retry as time and pt allow.   Ramond Dial 04/10/2021, 11:03 AM  Mee Hives, PT PhD Acute Rehab Dept. Number: [place] and [place]

## 2021-04-11 DIAGNOSIS — I679 Cerebrovascular disease, unspecified: Secondary | ICD-10-CM | POA: Diagnosis not present

## 2021-04-11 DIAGNOSIS — L97819 Non-pressure chronic ulcer of other part of right lower leg with unspecified severity: Secondary | ICD-10-CM | POA: Diagnosis not present

## 2021-04-11 DIAGNOSIS — J439 Emphysema, unspecified: Secondary | ICD-10-CM | POA: Diagnosis not present

## 2021-04-11 DIAGNOSIS — N139 Obstructive and reflux uropathy, unspecified: Secondary | ICD-10-CM | POA: Diagnosis not present

## 2021-04-11 DIAGNOSIS — Y832 Surgical operation with anastomosis, bypass or graft as the cause of abnormal reaction of the patient, or of later complication, without mention of misadventure at the time of the procedure: Secondary | ICD-10-CM | POA: Diagnosis present

## 2021-04-11 DIAGNOSIS — I1311 Hypertensive heart and chronic kidney disease without heart failure, with stage 5 chronic kidney disease, or end stage renal disease: Secondary | ICD-10-CM | POA: Diagnosis present

## 2021-04-11 DIAGNOSIS — R1312 Dysphagia, oropharyngeal phase: Secondary | ICD-10-CM | POA: Diagnosis not present

## 2021-04-11 DIAGNOSIS — E039 Hypothyroidism, unspecified: Secondary | ICD-10-CM | POA: Diagnosis not present

## 2021-04-11 DIAGNOSIS — R262 Difficulty in walking, not elsewhere classified: Secondary | ICD-10-CM | POA: Diagnosis not present

## 2021-04-11 DIAGNOSIS — A499 Bacterial infection, unspecified: Secondary | ICD-10-CM | POA: Diagnosis not present

## 2021-04-11 DIAGNOSIS — E782 Mixed hyperlipidemia: Secondary | ICD-10-CM | POA: Diagnosis not present

## 2021-04-11 DIAGNOSIS — I120 Hypertensive chronic kidney disease with stage 5 chronic kidney disease or end stage renal disease: Secondary | ICD-10-CM | POA: Diagnosis not present

## 2021-04-11 DIAGNOSIS — L03116 Cellulitis of left lower limb: Secondary | ICD-10-CM | POA: Diagnosis not present

## 2021-04-11 DIAGNOSIS — N186 End stage renal disease: Secondary | ICD-10-CM | POA: Diagnosis not present

## 2021-04-11 DIAGNOSIS — I10 Essential (primary) hypertension: Secondary | ICD-10-CM | POA: Diagnosis not present

## 2021-04-11 DIAGNOSIS — K259 Gastric ulcer, unspecified as acute or chronic, without hemorrhage or perforation: Secondary | ICD-10-CM | POA: Diagnosis not present

## 2021-04-11 DIAGNOSIS — E876 Hypokalemia: Secondary | ICD-10-CM | POA: Diagnosis present

## 2021-04-11 DIAGNOSIS — L03115 Cellulitis of right lower limb: Secondary | ICD-10-CM | POA: Diagnosis not present

## 2021-04-11 DIAGNOSIS — D631 Anemia in chronic kidney disease: Secondary | ICD-10-CM | POA: Diagnosis present

## 2021-04-11 DIAGNOSIS — N2581 Secondary hyperparathyroidism of renal origin: Secondary | ICD-10-CM | POA: Diagnosis present

## 2021-04-11 DIAGNOSIS — L03119 Cellulitis of unspecified part of limb: Secondary | ICD-10-CM | POA: Diagnosis not present

## 2021-04-11 DIAGNOSIS — I2510 Atherosclerotic heart disease of native coronary artery without angina pectoris: Secondary | ICD-10-CM | POA: Diagnosis not present

## 2021-04-11 DIAGNOSIS — D638 Anemia in other chronic diseases classified elsewhere: Secondary | ICD-10-CM | POA: Diagnosis not present

## 2021-04-11 DIAGNOSIS — E559 Vitamin D deficiency, unspecified: Secondary | ICD-10-CM | POA: Diagnosis not present

## 2021-04-11 DIAGNOSIS — N390 Urinary tract infection, site not specified: Secondary | ICD-10-CM | POA: Diagnosis present

## 2021-04-11 DIAGNOSIS — I480 Paroxysmal atrial fibrillation: Secondary | ICD-10-CM | POA: Diagnosis present

## 2021-04-11 DIAGNOSIS — R110 Nausea: Secondary | ICD-10-CM | POA: Diagnosis not present

## 2021-04-11 DIAGNOSIS — T8131XA Disruption of external operation (surgical) wound, not elsewhere classified, initial encounter: Secondary | ICD-10-CM | POA: Diagnosis present

## 2021-04-11 DIAGNOSIS — D509 Iron deficiency anemia, unspecified: Secondary | ICD-10-CM | POA: Diagnosis present

## 2021-04-11 DIAGNOSIS — Z20822 Contact with and (suspected) exposure to covid-19: Secondary | ICD-10-CM | POA: Diagnosis present

## 2021-04-11 DIAGNOSIS — T827XXA Infection and inflammatory reaction due to other cardiac and vascular devices, implants and grafts, initial encounter: Secondary | ICD-10-CM | POA: Diagnosis not present

## 2021-04-11 DIAGNOSIS — F419 Anxiety disorder, unspecified: Secondary | ICD-10-CM | POA: Diagnosis present

## 2021-04-11 DIAGNOSIS — B965 Pseudomonas (aeruginosa) (mallei) (pseudomallei) as the cause of diseases classified elsewhere: Secondary | ICD-10-CM | POA: Diagnosis present

## 2021-04-11 DIAGNOSIS — M6281 Muscle weakness (generalized): Secondary | ICD-10-CM | POA: Diagnosis not present

## 2021-04-11 DIAGNOSIS — K861 Other chronic pancreatitis: Secondary | ICD-10-CM | POA: Diagnosis not present

## 2021-04-11 DIAGNOSIS — K922 Gastrointestinal hemorrhage, unspecified: Secondary | ICD-10-CM | POA: Diagnosis not present

## 2021-04-11 DIAGNOSIS — K253 Acute gastric ulcer without hemorrhage or perforation: Secondary | ICD-10-CM | POA: Diagnosis not present

## 2021-04-11 DIAGNOSIS — J449 Chronic obstructive pulmonary disease, unspecified: Secondary | ICD-10-CM | POA: Diagnosis not present

## 2021-04-11 DIAGNOSIS — T827XXS Infection and inflammatory reaction due to other cardiac and vascular devices, implants and grafts, sequela: Secondary | ICD-10-CM | POA: Diagnosis not present

## 2021-04-11 DIAGNOSIS — Z992 Dependence on renal dialysis: Secondary | ICD-10-CM | POA: Diagnosis not present

## 2021-04-11 DIAGNOSIS — J189 Pneumonia, unspecified organism: Secondary | ICD-10-CM | POA: Diagnosis not present

## 2021-04-11 DIAGNOSIS — E7849 Other hyperlipidemia: Secondary | ICD-10-CM | POA: Diagnosis not present

## 2021-04-11 DIAGNOSIS — R339 Retention of urine, unspecified: Secondary | ICD-10-CM | POA: Diagnosis present

## 2021-04-11 DIAGNOSIS — T8130XA Disruption of wound, unspecified, initial encounter: Secondary | ICD-10-CM | POA: Diagnosis not present

## 2021-04-11 DIAGNOSIS — Z8673 Personal history of transient ischemic attack (TIA), and cerebral infarction without residual deficits: Secondary | ICD-10-CM | POA: Diagnosis not present

## 2021-04-11 DIAGNOSIS — M1990 Unspecified osteoarthritis, unspecified site: Secondary | ICD-10-CM | POA: Diagnosis present

## 2021-04-11 DIAGNOSIS — I4891 Unspecified atrial fibrillation: Secondary | ICD-10-CM | POA: Diagnosis not present

## 2021-04-11 DIAGNOSIS — R600 Localized edema: Secondary | ICD-10-CM | POA: Diagnosis not present

## 2021-04-11 DIAGNOSIS — A419 Sepsis, unspecified organism: Secondary | ICD-10-CM | POA: Diagnosis present

## 2021-04-11 DIAGNOSIS — G8929 Other chronic pain: Secondary | ICD-10-CM | POA: Diagnosis present

## 2021-04-11 DIAGNOSIS — Z7401 Bed confinement status: Secondary | ICD-10-CM | POA: Diagnosis not present

## 2021-04-11 DIAGNOSIS — R58 Hemorrhage, not elsewhere classified: Secondary | ICD-10-CM | POA: Diagnosis not present

## 2021-04-11 DIAGNOSIS — I639 Cerebral infarction, unspecified: Secondary | ICD-10-CM | POA: Diagnosis not present

## 2021-04-11 DIAGNOSIS — I6529 Occlusion and stenosis of unspecified carotid artery: Secondary | ICD-10-CM | POA: Diagnosis present

## 2021-04-11 DIAGNOSIS — E1122 Type 2 diabetes mellitus with diabetic chronic kidney disease: Secondary | ICD-10-CM | POA: Diagnosis not present

## 2021-04-11 DIAGNOSIS — I739 Peripheral vascular disease, unspecified: Secondary | ICD-10-CM | POA: Diagnosis not present

## 2021-04-11 LAB — CBC
HCT: 24.8 % — ABNORMAL LOW (ref 39.0–52.0)
Hemoglobin: 8.4 g/dL — ABNORMAL LOW (ref 13.0–17.0)
MCH: 29.9 pg (ref 26.0–34.0)
MCHC: 33.9 g/dL (ref 30.0–36.0)
MCV: 88.3 fL (ref 80.0–100.0)
Platelets: 248 10*3/uL (ref 150–400)
RBC: 2.81 MIL/uL — ABNORMAL LOW (ref 4.22–5.81)
RDW: 15.7 % — ABNORMAL HIGH (ref 11.5–15.5)
WBC: 8.5 10*3/uL (ref 4.0–10.5)
nRBC: 0 % (ref 0.0–0.2)

## 2021-04-11 LAB — SURGICAL PATHOLOGY

## 2021-04-11 MED ORDER — ONDANSETRON HCL 4 MG PO TABS: 4.0000 mg | ORAL_TABLET | Freq: Every day | ORAL | 1 refills | Status: DC | PRN

## 2021-04-11 MED ORDER — HEPARIN SODIUM (PORCINE) 1000 UNIT/ML DIALYSIS
1000.0000 [IU] | INTRAMUSCULAR | Status: DC | PRN
  Administered 2021-04-11: 1000 [IU] via INTRAVENOUS_CENTRAL

## 2021-04-11 MED ORDER — ALTEPLASE 2 MG IJ SOLR: 2.0000 mg | Freq: Once | INTRAMUSCULAR | Status: DC | PRN

## 2021-04-11 MED ORDER — SODIUM CHLORIDE 0.9 % IV SOLN: 100.0000 mL | INTRAVENOUS | Status: DC | PRN

## 2021-04-11 MED ORDER — LIDOCAINE HCL (PF) 1 % IJ SOLN: 5.0000 mL | INTRAMUSCULAR | Status: DC | PRN

## 2021-04-11 MED ORDER — LIDOCAINE-PRILOCAINE 2.5-2.5 % EX CREA: 1.0000 "application " | TOPICAL_CREAM | CUTANEOUS | Status: DC | PRN

## 2021-04-11 MED ORDER — PENTAFLUOROPROP-TETRAFLUOROETH EX AERO: 1.0000 "application " | INHALATION_SPRAY | CUTANEOUS | Status: DC | PRN

## 2021-04-11 NOTE — TOC Transition Note (Addendum)
Transition of Care [facility]) - CM/SW Discharge Note   Patient Details  Name: Wayne Meadows MRN: [id number] Date of Birth: 1934-04-15  Transition of [facility]) CM/SW Contact:  Coralee Pesa, [place] Phone Number: 04/11/2021, 11:56 AM   Clinical Narrative:    Pt to be transported to [facility] after Dialysis, by PTAR.  Pt is on Will call for transport, RN to call when ready. Nurse to call report to [phone]. Rm [phone].  Final next level of care: Skilled Nursing Facility Barriers to Discharge:  (HD)   Patient Goals and CMS Choice Patient states their goals for this hospitalization and ongoing recovery are:: Pt states he wants to be able to go home. CMS Medicare.gov Compare Post Acute Care list provided to:: Patient Choice offered to / list presented to : Patient, Adult Children  Discharge Placement              Patient chooses bed at: [facility] Patient to be transferred to facility by: [place] Name of family member notified: Tammy, patient Patient and family notified of of transfer: 04/11/21  Discharge Plan and Services     Post Acute Care Choice: [place]                               Social Determinants of Health (SDOH) Interventions     Readmission Risk Interventions Readmission Risk Prevention Plan 03/21/2021 03/19/2021  Transportation Screening Complete -  [place] or Home Care Consult Complete -  SW Recovery Care/Counseling Consult Complete Complete  Skilled Nursing Facility Complete Complete  Some recent data might be hidden

## 2021-04-11 NOTE — Plan of Care (Signed)
  Problem: Education: Goal: Knowledge of General Education information will improve Description: Including pain rating scale, medication(s)/side effects and non-pharmacologic comfort measures Outcome: Progressing   Problem: Health Behavior/Discharge Planning: Goal: Ability to manage health-related needs will improve Outcome: Progressing   Problem: Clinical Measurements: Goal: Will remain free from infection Outcome: Progressing   

## 2021-04-11 NOTE — Evaluation (Signed)
Physical Therapy Evaluation Patient Details Name: Wayne Meadows MRN: [id number] DOB: 1934/03/03 Today's Date: 04/11/2021  History of Present Illness  87 y/o male presented to ED from SNF on 2/12 for vomiting x 2 weeks and vomiting blood today with dark coffee ground diarrhea. EGD found 2 superficial gastric ulcers and 3 clean-based duodenal ulcers. Recently admitted 1/23-2/10 for stage IV with persistent LE erythma and fluid overload. PMH: CAD, COPD, HTN, CVA, renal carcinoma  Clinical Impression  Pt was seen for evaluation of mobility on side of bed, with pt demonstrating some desaturation even on O2 with standing and sidestepping.  He is worried about nausea developing to stand, and got the emesis bag to manage his concerns.  However, did well and no vomiting occurred.  Follow acutely for goals of PT to progress his independence and safety for gait and transfers to get home.  SNF recommended due to limited help at home and dense weakness, difficulty standing.       Recommendations for follow up therapy are one component of a multi-disciplinary discharge planning process, led by the attending physician.  Recommendations may be updated based on patient status, additional functional criteria and insurance authorization.  Follow Up Recommendations Skilled nursing-short term rehab (<3 hours/day)    Assistance Recommended at Discharge Frequent or constant Supervision/Assistance  Patient can return home with the following  A little help with walking and/or transfers;A little help with bathing/dressing/bathroom;Assistance with cooking/housework;Assist for transportation;Help with stairs or ramp for entrance    Equipment Recommendations Rolling walker (2 wheels)  Recommendations for Other Services       Functional Status Assessment Patient has had a recent decline in their functional status and demonstrates the ability to make significant improvements in function in a reasonable and predictable  amount of time.     Precautions / Restrictions Precautions Precautions: Fall Precaution Comments: monitor sats Restrictions Weight Bearing Restrictions: No      Mobility  Bed Mobility Overal bed mobility: Needs Assistance Bed Mobility: Supine to Sit, Sit to Supine Rolling: Min assist, Mod assist   Supine to sit: Min assist, Mod assist Sit to supine: Mod assist   General bed mobility comments: assist with bed rail and hob elevated    Transfers Overall transfer level: Needs assistance Equipment used: Rolling walker (2 wheels) Transfers: Sit to/from Stand Sit to Stand: Mod assist           General transfer comment: mod to stand and after second effort grabbed bag for emesis "Just in case"    Ambulation/Gait Ambulation/Gait assistance: Min assist Gait Distance (Feet): 3 Feet Assistive device: Rolling walker (2 wheels) Gait Pattern/deviations: Step-to pattern, Decreased stride length Gait velocity: decreased Gait velocity interpretation: <1.8 ft/sec, indicate of risk for recurrent falls Pre-gait activities: standing balance ck and observation of sats General Gait Details: sidesteps with use of walker hugging side of bed as pt is feeling unsteady on his feet  Stairs            Wheelchair Mobility    Modified Rankin (Stroke Patients Only)       Balance Overall balance assessment: Needs assistance Sitting-balance support: Feet supported Sitting balance-Leahy Scale: Good     Standing balance support: Bilateral upper extremity supported, Reliant on assistive device for balance Standing balance-Leahy Scale: Poor                               Pertinent Vitals/Pain Pain Assessment Pain Assessment:  No/denies pain Faces Pain Scale: Hurts a little bit Breathing: normal Negative Vocalization: none Facial Expression: smiling or inexpressive Body Language: relaxed Consolability: no need to console PAINAD Score: 0 Pain Location: R arm with  pressure due to edema Pain Descriptors / Indicators: Guarding Pain Intervention(s): Monitored during session, Repositioned    Home Living Family/patient expects to be discharged to:: Skilled nursing facility Living Arrangements: Alone Available Help at Discharge: Family;Available PRN/intermittently Type of Home: House Home Access: Stairs to enter Entrance Stairs-Rails: None Entrance Stairs-Number of Steps: 2   Home Layout: One level Home Equipment: Rollator (4 wheels);Cane - single point Additional Comments: Pt has been at SNF since last admission    Prior Function Prior Level of Function : Needs assist       Physical Assist : Mobility (physical) Mobility (physical): Gait;Transfers;Bed mobility   Mobility Comments: has not had time to progress since admission to SNF with a fast return to hosp       Hand Dominance   Dominant Hand: Right    Extremity/Trunk Assessment   Upper Extremity Assessment Upper Extremity Assessment: Defer to OT evaluation    Lower Extremity Assessment Lower Extremity Assessment: Generalized weakness    Cervical / Trunk Assessment Cervical / Trunk Assessment: Kyphotic  Communication   Communication: HOH  Cognition Arousal/Alertness: Awake/alert Behavior During Therapy: WFL for tasks assessed/performed Overall Cognitive Status: No family/caregiver present to determine baseline cognitive functioning                                 General Comments: has difficulty with getting history information        General Comments General comments (skin integrity, edema, etc.): Pt was able to stand with O2 support, sats dropped briefly in standing and down to 86% then back into 93% range    Exercises     Assessment/Plan    PT Assessment Patient needs continued PT services  PT Problem List Decreased strength;Decreased activity tolerance;Decreased balance;Decreased coordination;Decreased mobility;Cardiopulmonary status limiting  activity;Decreased skin integrity;Pain       PT Treatment Interventions Gait training;Therapeutic exercise;Patient/family education;DME instruction;Therapeutic activities;Functional mobility training;Neuromuscular re-education;Balance training    PT Goals (Current goals can be found in the Care Plan section)  Acute Rehab PT Goals Patient Stated Goal: to finish rehab for home PT Goal Formulation: With patient Time For Goal Achievement: 04/25/21 Potential to Achieve Goals: Good    Frequency Min 2X/week     Co-evaluation               AM-PAC PT "6 Clicks" Mobility  Outcome Measure Help needed turning from your back to your side while in a flat bed without using bedrails?: A Lot Help needed moving from lying on your back to sitting on the side of a flat bed without using bedrails?: A Lot Help needed moving to and from a bed to a chair (including a wheelchair)?: A Lot Help needed standing up from a chair using your arms (e.g., wheelchair or bedside chair)?: A Lot Help needed to walk in hospital room?: A Lot Help needed climbing 3-5 steps with a railing? : Total 6 Click Score: 11    End of Session Equipment Utilized During Treatment: Gait belt Activity Tolerance: Patient tolerated treatment well;Patient limited by fatigue Patient left: with call bell/phone within reach;in bed;with bed alarm set;Other (comment) (heading to HD) Nurse Communication: Mobility status PT Visit Diagnosis: Difficulty in walking, not elsewhere classified (R26.2);Muscle weakness (generalized) (  M62.81);Unsteadiness on feet (R26.81);Other abnormalities of gait and mobility (R26.89) Pain - Right/Left: Right Pain - part of body: Arm    Time: 1292-9090 PT Time Calculation (min) (ACUTE ONLY): 25 min   Charges:   PT Evaluation $PT Eval Moderate Complexity: 1 Mod PT Treatments $Therapeutic Activity: 8-22 mins       Ramond Dial 04/11/2021, 2:39 PM  Mee Hives, PT PhD Acute Rehab Dept. Number: [place] [facility] [place]

## 2021-04-11 NOTE — Progress Notes (Signed)
[facility] Progress Note   Subjective:   Seen in room, reports arm was weeping last night but seems to be improving. Has been trying to keep it elevated. Denies SOB, CP, palpitations, dizziness.   Objective Vitals:   04/10/21 2100 04/10/21 2201 04/11/21 0300 04/11/21 0401  BP:  (!) 159/60 (!) 162/57 (!) 157/56  Pulse:  80    Resp:      Temp: 98.3 F (36.8 C)     TempSrc: Oral     SpO2:  98%  99%  Weight:      Height:       Physical Exam General: WDWN male, alert and in NAD Heart: RRR, no murmurs, rubs or gallops Lungs: CTA bilaterally without wheezing Abdomen: Soft, non-tender, non-distended, +BS Extremities: no edema b/l lower extremities, 2+ edema RUE with bruising on dorsum Dialysis Access:  TDC, RUE AVF + t/b  Additional Objective Labs: Basic Metabolic Panel: Recent Labs  Lab 04/08/21 0453 04/09/21 0416 04/10/21 0415  NA 137 138 136  K 3.7 3.8 3.7  CL 103 104 100  CO2 19* 22 26  GLUCOSE 110* 106* 96  BUN 42* 49* 28*  CREATININE 4.68* 5.43* 4.06*  CALCIUM 8.0* 7.6* 7.8*   Liver Function Tests: Recent Labs  Lab 04/06/21 0227 04/07/21 0930  AST 12* 16  ALT 5 <5  ALKPHOS 46 40  BILITOT 0.3 0.8  PROT 5.0* 4.6*  ALBUMIN 1.8* 1.9*   Recent Labs  Lab 04/06/21 0227  LIPASE 65*   CBC: Recent Labs  Lab 04/06/21 0227 04/06/21 0956 04/07/21 0930 04/07/21 1845 04/08/21 0453 04/09/21 0416 04/10/21 0415 04/11/21 0307  WBC 12.4*   < > 14.3*  --  10.9* 9.8 9.1 8.5  NEUTROABS 10.9*  --   --   --   --   --   --   --   HGB 8.4*   < > 8.6*   < > 8.2* 7.6* 8.3* 8.4*  HCT 25.9*   < > 25.2*   < > 24.1* 23.0* 24.8* 24.8*  MCV 92.8   < > 86.0  --  86.7 88.1 87.9 88.3  PLT 417*   < > 258  --  235 265 262 248   < > = values in this interval not displayed.   Blood Culture    Component Value Date/Time   SDES  07/18/2020 1129    ABSCESS GASTRIC WALL MASS Performed at [facility] [address], [place], [place] [address]     [place]  07/18/2020 1129    NONE Performed at [facility], [place] [address] [place], [place] [address]    CULT (A) 07/18/2020 1129    MULTIPLE ORGANISMS PRESENT, NONE PREDOMINANT NO GROUP A STREP (S.PYOGENES) ISOLATED NO STAPHYLOCOCCUS AUREUS ISOLATED NO ANAEROBES ISOLATED Performed at [facility], [place] [address], [place], [place] [address]    REPTSTATUS 07/23/2020 FINAL 07/18/2020 1129    Medications:   sodium chloride   Intravenous Once   sodium chloride   Intravenous Once   Chlorhexidine Gluconate Cloth  6 each Topical Daily   Chlorhexidine Gluconate Cloth  6 each Topical Q0600   cloNIDine  0.1 mg Oral Daily   diltiazem  180 mg Oral Daily   levothyroxine  50 mcg Oral Q0600   neomycin-bacitracin-polymyxin   Topical TID   pantoprazole  40 mg Oral BID   pravastatin  40 mg Oral QHS   tamsulosin  0.4 mg Oral Daily  Dialysis Orders: MWF  - [facility]  3>3.5>4hrs, BFR 400, DFR 800  EDW 87kg, 2K/ 2.5Ca   Access: TDC, AVF maturing  Heparin 3000 unit bolus Mircera 50 mcg q2wks - not given   Assessment/Plan:  Acute GIB - Hgb dropped, s/p 3 units pRBC.  MNO1/77 with gastric and duodenal ulcers biopsied as well as salmon colored gastric mucosa.  GI following. Hgb now stable at 8.4.  Leukocytosis - resolved. PNA noted on CXR/CT.  ABX started - azithromax.  Afib w/ RVR: controlled w/ cardizem, now on PO. Per pmd, will follow with cardio as an outpatient. HR controlled RUE pain/swelling - seen by VVS, no indication of steal syndrome.  Possible hematoma.  Recommend to elevate arm as able. Swelling improving  ESRD -  on MWF, new start at [facility] last admit.  No outpatient HD yet. Continue on regular schedule. No heparin. HD today.   Hypertension/volume  - BP elevated, continue home meds.  Working to establish EDW, continue UF as tolerated.  ABLA on chronic anemia of CKD - due to #1. As above.  Continue ESA.   Secondary Hyperparathyroidism -  Calcium low, use  increased Ca bath.  Check phos. Not on VDRA.  Nutrition - Renal diet w/fluid restrictions.  Hx CAD DMT2 - per PMD Hx CVA  Anice Paganini, PA-C 04/11/2021, 9:34 AM  [facility] Pager: [phone]

## 2021-04-11 NOTE — Progress Notes (Addendum)
Pt to d/c to snf today. Contacted [facility] to make clinic aware pt will start on Monday. Contacted renal PA regarding clinic's need for orders. Contacted CSW to request that snf be reminded that pt needs to arrive at 12:00 on Monday to complete paperwork prior to 1:00 chair time. HD appts added to AVS as well.   Melven Sartorius Renal Navigator [phone]

## 2021-04-11 NOTE — Discharge Summary (Signed)
[facility]  Physician Discharge Summary   Patient ID: Wayne Meadows MRN: [id number] DOB/AGE: 1934/06/30 87 y.o.  Admit date: 04/06/2021 Discharge date:   04/11/2021   PCP: Clinic, [place]:  Principal Problem:   Acute upper GI bleed Active Problems:   Acute blood loss anemia   Atrial fibrillation (HCC)   Paroxysmal atrial fibrillation with RVR (HCC)   ESRD (end stage renal disease) on dialysis (HCC)   COPD (chronic obstructive pulmonary disease) (HCC)   Essential hypertension   Community acquired pneumonia   Hypothyroidism   Chronic pancreatitis (HCC)   Supratherapeutic INR   Elevated INR   Gastric ulcer without hemorrhage or perforation   Duodenal ulcer   RECOMMENDATIONS FOR OUTPATIENT FOLLOW UP: Check CBC and basic metabolic panel in 3 to 4 days Monitor blood pressure and adjust medications as indicated Appointment has been made with cardiology in early March. Patient to keep up with his usual hemodialysis schedule on Monday Wednesday Fridays Needs oxygen continuously at 2 L/min Voiding trial once he is more mobile   Home Health: Going to SNF Equipment/Devices: None  CODE STATUS: Full code  DISCHARGE CONDITION: fair  Diet recommendation: Heart healthy  INITIAL HISTORY: 87 y.o. male with history of chronic kidney disease stage IIIb, CVA and CAD, diabetes mellitus, COPD, hypertension was admitted twice in the recent past - once for cellulitis of the lower extremity, discharged on antibiotics, second for worsening renal function and transitioned to HD due to acute renal failure. Discharged 2/10 and stable for PT, tolerating dialysis well. Patient presents back to hospital again from SNF for episode of coffee ground emesis overnight. GI consulted for follow up.   2/6 Status post right IJ tunneled HD catheter placed by IR 2/7 Tolerated first HD 2/8 Complaining of nausea, resolved with treatment 2/9 RUE Brachiocephalic AV  fistula placement 2/10 - DISCHARGED TO REHAB 2/11 - READMITTED DUE TO COFFEE GROUND EMESIS AND WORSENING ANEMIA 2/12-13 - Continues to be anemic - EGD pending INR correction 2/14 - EGD shows nonbleeding ulcer, patient complaining of worsening RUE pain at new fistula sight - vascular asked to evaluate;  post EGD A-fib with RVR resolved with diltiazem   Consultants: Gastroenterology.  Vascular surgery   Procedures: EGD 2/14    HOSPITAL COURSE:   * Acute upper GI bleed- (present on admission) Patient presented with coffee-ground emesis.  Seen by gastroenterology.  Underwent EGD on 2/14 which showed nonbleeding gastric and duodenal ulcers.  These were biopsied.  PPI twice a day as recommended for 6 weeks followed by once a day.   Seems to be stable from a gastroenterology standpoint. Experienced nausea yesterday.  No vomiting.  Can use antiemetics as needed.  Acute blood loss anemia- (present on admission) Has hx of chronic anemia with Hgb in the 9.5-10 range.  Hemoglobin dropped to 6.6.  Patient was transfused PRBC. Hemoglobin stable for the last 3 days.  No recurrence of bleeding.  Paroxysmal atrial fibrillation with RVR ([place]) Patient was noted to be in atrial fibrillation with RVR after his upper endoscopy.  Required diltiazem infusion briefly.  Subsequently transitioned to oral diltiazem.  Noted to be in sinus rhythm this morning.  Did not see any history of atrial fibrillation mention in the chart previously.  Cardiology notes also reviewed.   However based on Dr. Justus Memory note and as per his discussion with patient he does have a history of paroxysmal atrial fibrillation and is not on anticoagulation due to low burden. Will  defer these issues to cardiology in the outpatient setting. He is followed by Dr. Burt Knack.  Message was sent to cardiology.  He has been made an appointment in March.    ESRD (end stage renal disease) on dialysis [facility]) Urinary retention  Nephrology is  following.  He is on a Monday Wednesday Friday schedule.   Recently underwent fistula placement.  As result he still has right arm swelling.  Patient was seen by vascular surgery and they did not see any obvious areas of concerns.  Has good radial pulses.  Antibiotic lotion ordered for the wound. Noted to have a Foley catheter.  Review of his records suggest that it was placed in January for possible retention.  At that time he had presented with acute kidney injury.  Flomax was initiated.  Recommend doing a voiding trial at skilled nursing facility once he is more mobile.  COPD (chronic obstructive pulmonary disease) ([place])- (present on admission) Stable from a pulmonary standpoint.  He is noted to desaturate to mid 80s on room air.  He will need home oxygen.  Essential hypertension- (present on admission) Patient was on amlodipine metoprolol and clonidine prior to admission.  Amlodipine switched over to diltiazem due to atrial fibrillation Clonidine was resumed to avoid rebound hypertension.  Holding metoprolol for now since he is now on diltiazem. Changed over to long-acting diltiazem today at a slightly higher dose which should help the blood pressure.  Community acquired pneumonia Started on antibiotics initially but then discontinued due to normal procalcitonin.  Leukocytosis thought to be reactive  Stable from a respiratory standpoint.  Will need home oxygen as discussed above.  Hypothyroidism- (present on admission) Continue levothyroxine  Chronic pancreatitis ([place])- (present on admission) Stable.  Supratherapeutic INR Reason for elevated INR not clear.  Improved with vitamin K.   Stage I decubitus Pressure Injury 04/06/21 Buttocks Left Stage 1 -  Intact skin with non-blanchable redness of a localized area usually over a bony prominence. healing area with dry flaky area (Active)  04/06/21 2140  Location: Buttocks  Location Orientation: Left  Staging: Stage 1 -  Intact skin with  non-blanchable redness of a localized area usually over a bony prominence.  Wound Description (Comments): healing area with dry flaky area  Present on Admission: Yes     Patient is stable.  Okay for discharge to skilled nursing facility after dialysis.   PERTINENT LABS:  The results of significant diagnostics from this hospitalization (including imaging, microbiology, ancillary and laboratory) are listed below for reference.    Microbiology: Recent Results (from the past 240 hour(s))  Surgical pcr screen     Status: Abnormal   Collection Time: 04/03/21  7:05 AM   Specimen: Nasal Mucosa; Nasal Swab  Result Value Ref Range Status   MRSA, PCR POSITIVE (A) NEGATIVE Final    Comment: RESULT CALLED TO, READ BACK BY AND VERIFIED WITH: RN Charlene Brooke [phone] [id number] MLM    Staphylococcus aureus POSITIVE (A) NEGATIVE Final    Comment: (NOTE) The Xpert SA Assay (FDA approved for NASAL specimens in patients 79 years of age and older), is one component of a comprehensive surveillance program. It is not intended to diagnose infection nor to guide or monitor treatment. Performed at [facility], [place] [address], [place], [place] [address]   Resp Panel by RT-PCR (Flu A&B, Covid) Nasopharyngeal Swab     Status: None   Collection Time: 04/06/21  2:04 AM   Specimen: Nasopharyngeal Swab; Nasopharyngeal(NP) swabs in vial  transport medium  Result Value Ref Range Status   SARS Coronavirus 2 by RT PCR NEGATIVE NEGATIVE Final    Comment: (NOTE) SARS-CoV-2 target nucleic acids are NOT DETECTED.  The SARS-CoV-2 RNA is generally detectable in upper respiratory specimens during the acute phase of infection. The lowest concentration of SARS-CoV-2 viral copies this assay can detect is 138 copies/mL. A negative result does not preclude SARS-Cov-2 infection and should not be used as the sole basis for treatment or other patient management decisions. A negative result may occur with  improper specimen  collection/handling, submission of specimen other than nasopharyngeal swab, presence of viral mutation(s) within the areas targeted by this assay, and inadequate number of viral copies(<138 copies/mL). A negative result must be combined with clinical observations, patient history, and epidemiological information. The expected result is Negative.  Fact Sheet for Patients:  EntrepreneurPulse.com.au  Fact Sheet for Healthcare Providers:  IncredibleEmployment.be  This test is no t yet approved or cleared by the [place] FDA and  has been authorized for detection and/or diagnosis of SARS-CoV-2 by FDA under an Emergency Use Authorization (EUA). This EUA will remain  in effect (meaning this test can be used) for the duration of the COVID-19 declaration under Section 564(b)(1) of the Act, 21 U.S.C.section 360bbb-3(b)(1), unless the authorization is terminated  or revoked sooner.       Influenza A by PCR NEGATIVE NEGATIVE Final   Influenza B by PCR NEGATIVE NEGATIVE Final    Comment: (NOTE) The Xpert Xpress SARS-CoV-2/FLU/RSV plus assay is intended as an aid in the diagnosis of influenza from Nasopharyngeal swab specimens and should not be used as a sole basis for treatment. Nasal washings and aspirates are unacceptable for Xpert Xpress SARS-CoV-2/FLU/RSV testing.  Fact Sheet for Patients: EntrepreneurPulse.com.au  Fact Sheet for Healthcare Providers: IncredibleEmployment.be  This test is not yet approved or cleared by the [place] FDA and has been authorized for detection and/or diagnosis of SARS-CoV-2 by FDA under an Emergency Use Authorization (EUA). This EUA will remain in effect (meaning this test can be used) for the duration of the COVID-19 declaration under Section 564(b)(1) of the Act, 21 U.S.C. section 360bbb-3(b)(1), unless the authorization is terminated or revoked.  Performed at [facility], [place] [address], [place],  [address]      Labs:  COVID-19 Labs   Lab Results  Component Value Date   SARSCOV2NAA NEGATIVE 04/06/2021   [place] NEGATIVE 03/17/2021   [place] NEGATIVE 03/04/2021   [place] NEGATIVE 08/12/2020      Basic Metabolic Panel: Recent Labs  Lab 04/06/21 0227 04/07/21 0930 04/08/21 0453 04/09/21 0416 04/10/21 0415  NA 136 140 137 138 136  K 4.5 4.8 3.7 3.8 3.7  CL 100 106 103 104 100  CO2 20* 17* 19* 22 26  GLUCOSE 145* 104* 110* 106* 96  BUN 60* 81* 42* 49* 28*  CREATININE 5.65* 7.07* 4.68* 5.43* 4.06*  CALCIUM 7.6* 7.5* 8.0* 7.6* 7.8*   Liver Function Tests: Recent Labs  Lab 04/06/21 0227 04/07/21 0930  AST 12* 16  ALT 5 <5  ALKPHOS 46 40  BILITOT 0.3 0.8  PROT 5.0* 4.6*  ALBUMIN 1.8* 1.9*   Recent Labs  Lab 04/06/21 0227  LIPASE 65*    CBC: Recent Labs  Lab 04/06/21 0227 04/06/21 0956 04/07/21 0930 04/07/21 1845 04/08/21 0453 04/09/21 0416 04/10/21 0415 04/11/21 0307  WBC 12.4*   < > 14.3*  --  10.9* 9.8 9.1 8.5  NEUTROABS 10.9*  --   --   --   --   --   --   --  HGB 8.4*   < > 8.6* 9.0* 8.2* 7.6* 8.3* 8.4*  HCT 25.9*   < > 25.2* 27.0* 24.1* 23.0* 24.8* 24.8*  MCV 92.8   < > 86.0  --  86.7 88.1 87.9 88.3  PLT 417*   < > 258  --  235 265 262 248   < > = values in this interval not displayed.     IMAGING STUDIES CT ABDOMEN PELVIS WO CONTRAST  Result Date: 04/06/2021 CLINICAL DATA:  Abdominal pain. EXAM: CT ABDOMEN AND PELVIS WITHOUT CONTRAST TECHNIQUE: Multidetector CT imaging of the abdomen and pelvis was performed following the standard protocol without IV contrast. RADIATION DOSE REDUCTION: This exam was performed according to the departmental dose-optimization program which includes automated exposure control, adjustment of the mA and/or kV according to patient size and/or use of iterative reconstruction technique. COMPARISON:  CT abdomen pelvis dated 08/12/2020. FINDINGS:  Evaluation of this exam is limited in the absence of intravenous contrast. Evaluation is also limited due to streak artifact caused by patient's arms. Lower chest: Partially visualized small bilateral pleural effusions, left greater right with associated partial compressive atelectasis of the lower lobes versus pneumonia. Clinical correlation is recommended. Three vessel coronary vascular calcification and small pericardial effusion. Partially visualized central venous line with tip at the cavoatrial junction. No intra-abdominal free air or free fluid. Hepatobiliary: Ill-defined 2.5 cm hypodense lesion in the right lobe of the liver not characterized on this CT. No intrahepatic biliary dilatation. Cholecystectomy. Pancreas: Atrophic pancreas with scattered coarse calcification suggestive of chronic pancreatitis. No active inflammatory changes. Spleen: Normal in size without focal abnormality. Adrenals/Urinary Tract: The adrenal glands are suboptimally visualized. There is no hydronephrosis or nephrolithiasis on either side. Bilateral renal cysts measure up to 4.5 cm in the upper pole of the right kidney. Additional smaller hypodense lesions are not characterized on this CT. The visualized ureters appear unremarkable. The urinary bladder is decompressed around a Foley catheter. Stomach/Bowel: Mild stranding surrounding the duodenal C-loop may represent duodenum this. Clinical correlation is recommended. There is sigmoid diverticulosis and scattered colonic diverticula without active inflammatory changes. No bowel obstruction. Vascular/Lymphatic: Advanced aortoiliac atherosclerotic disease. The IVC is unremarkable. No portal venous gas. There is no adenopathy. Reproductive: The prostate gland is poorly visualized. Other: Mild diffuse subcutaneous edema. Musculoskeletal: Osteopenia with degenerative changes of the spine. Lower lumbar posterior fusion hardware as well as right hip arthroplasty. No acute osseous  pathology. IMPRESSION: 1. Mild stranding surrounding the duodenal C-loop may represent duodenum. No bowel obstruction. 2. Colonic diverticulosis. 3. No hydronephrosis or nephrolithiasis. 4. Partially visualized small bilateral pleural effusions, left greater right with associated partial compressive atelectasis of the lower lobes versus pneumonia. 5. Ill-defined 2.5 cm hypodense lesion in the right lobe of the liver not characterized on this CT. 6. Aortic Atherosclerosis (ICD10-I70.0). Electronically Signed   By: Anner Crete M.D.   On: 04/06/2021 03:16   DG Chest 2 View  Result Date: 03/27/2021 CLINICAL DATA:  Weakness, COPD, former smoker EXAM: CHEST - 2 VIEW COMPARISON:  Chest x-ray 03/09/2021. FINDINGS: The heart and mediastinal contours are unchanged. Aortic calcification. Bibasilar vague airspace opacities. Chronic coarsened insert markings with no overt pulmonary edema. Bilateral at least small volume pleural effusions. Effusion. No pneumothorax. No acute osseous abnormality. IMPRESSION: 1. Bilateral at least small volume pleural effusions. 2. Bibasilar vague airspace opacities may represent a combination of atelectasis versus infection/inflammation. 3. Aortic Atherosclerosis (ICD10-I70.0) and Emphysema (ICD10-J43.9). Electronically Signed   By: Iven Finn M.D.   On:  03/27/2021 15:05   DG Abd 1 View  Result Date: 03/18/2021 CLINICAL DATA:  Increased weakness and distension EXAM: ABDOMEN - 1 VIEW COMPARISON:  10/26/2011 KUB, CT abdomen 08/12/2020 FINDINGS: Gaseous distension of the small bowel and colon. No evidence of pneumoperitoneum, portal venous gas or pneumatosis. No pathologic calcifications along the expected course of the ureters. No acute osseous abnormality. Posterior spinal fusion from L2 through S1. Right hip arthroplasty. IMPRESSION: Gaseous distension of the small bowel and colon which may reflect an ileus versus partial bowel obstruction. Electronically Signed   By: Kathreen Devoid  M.D.   On: 03/18/2021 06:35   US RENAL  Result Date: 03/18/2021 CLINICAL DATA:  Acute renal failure. EXAM: RENAL / URINARY TRACT ULTRASOUND COMPLETE COMPARISON:  Renal ultrasound dated 03/05/2021. FINDINGS: Right Kidney: Renal measurements: 11.4 x 5.9 x 6.3 cm = volume: 224 mL. Moderate parenchyma atrophy. There is diffuse increased renal echogenicity. No hydronephrosis or shadowing stone. Several cysts measure up to 4 cm in the upper pole. Left Kidney: Renal measurements: 9.6 x 5.8 x 5.7 cm = volume: 165 mL. Moderate parenchyma atrophy. Increased echogenicity. No hydronephrosis or shadowing stone. Several cysts measure up to 3.5 cm. Bladder: Appears normal for degree of bladder distention. Other: None. IMPRESSION: 1. Atrophic and echogenic kidneys in keeping with chronic kidney disease. No hydronephrosis or shadowing stone. 2. Bilateral renal cysts. Electronically Signed   By: Anner Crete M.D.   On: 03/18/2021 03:18   IR Fluoro Guide CV Line Right  Result Date: 03/31/2021 INDICATION: 87 year old male with progressive renal dysfunction in need of acute hemodialysis. He presents for EXAM: TUNNELED CENTRAL VENOUS HEMODIALYSIS CATHETER PLACEMENT WITH ULTRASOUND AND FLUOROSCOPIC GUIDANCE MEDICATIONS: 2 g Ancef. The antibiotic was given in an appropriate time interval prior to skin puncture. ANESTHESIA/SEDATION: Moderate (conscious) sedation was employed during this procedure. A total of Versed 1 mg and Fentanyl 25 mcg was administered intravenously. Moderate Sedation Time: 17 minutes. The patient's level of consciousness and vital signs were monitored continuously by radiology nursing throughout the procedure under my direct supervision. FLUOROSCOPY TIME:  2 mGy, air kerma COMPLICATIONS: None immediate. PROCEDURE: Informed written consent was obtained from the patient after a discussion of the risks, benefits, and alternatives to treatment. Questions regarding the procedure were encouraged and answered. The  right neck and chest were prepped with chlorhexidine in a sterile fashion, and a sterile drape was applied covering the operative field. Maximum barrier sterile technique with sterile gowns and gloves were used for the procedure. A timeout was performed prior to the initiation of the procedure. After creating a small venotomy incision, a micropuncture kit was utilized to access the right internal jugular vein under direct, real-time ultrasound guidance after the overlying soft tissues were anesthetized with 1% lidocaine with epinephrine. Ultrasound image documentation was performed. The microwire was kinked to measure appropriate catheter length. A stiff Glidewire was advanced to the level of the IVC and the micropuncture sheath was exchanged for a peel-away sheath. A Palindrome tunneled hemodialysis catheter measuring 23 cm from tip to cuff was tunneled in a retrograde fashion from the anterior chest wall to the venotomy incision. The catheter was then placed through the peel-away sheath with tips ultimately positioned within the superior aspect of the right atrium. Final catheter positioning was confirmed and documented with a spot radiographic image. The catheter aspirates and flushes normally. The catheter was flushed with appropriate volume heparin dwells. The catheter exit site was secured with a 0-Prolene retention suture. The venotomy incision was  closed with an interrupted 4-0 Vicryl, Dermabond and Steri-strips. Dressings were applied. The patient tolerated the procedure well without immediate post procedural complication. IMPRESSION: Successful placement of 23 cm tip to cuff tunneled hemodialysis catheter via the right internal jugular vein with tips terminating within the superior aspect of the right atrium. The catheter is ready for immediate use. Electronically Signed   By: Jacqulynn Cadet M.D.   On: 03/31/2021 10:48   DG Chest Portable 1 View  Result Date: 04/06/2021 CLINICAL DATA:  Difficulty  breathing EXAM: PORTABLE CHEST 1 VIEW COMPARISON:  03/27/2021 FINDINGS: Hazy opacities in the bilateral lower lungs, reflecting combination of small to moderate bilateral pleural effusions (right greater than left) and suspected lower lobe compressive atelectasis. No frank interstitial edema. The heart is normal in size.  Mild thoracic aortic atherosclerosis. Right IJ dialysis catheter terminates in the upper right atrium. IMPRESSION: Small to moderate bilateral pleural effusions (right greater than left) with suspected lower lobe compressive atelectasis. Electronically Signed   By: Julian Hy M.D.   On: 04/06/2021 02:23   VAS [place] UPPER EXT VEIN MAPPING (PRE-OP AVF)  Result Date: 04/02/2021 [place] MAPPING Patient Name:  Wayne Meadows  Date of Exam:   04/02/2021 Medical Rec #: [id number]         Accession #:    [id number] Date of Birth: 1934/05/12        Patient Gender: M Patient Age:   71 years Exam Location:  [facility] Procedure:      VAS [place] UPPER EXT VEIN MAPPING (PRE-OP AVF) Referring Phys: Dagoberto Ligas --------------------------------------------------------------------------------  Indications: Pre-access. Comparison Study: No previous exams Performing Technologist: Jody Hill RVT, RDMS  Examination Guidelines: A complete evaluation includes B-mode imaging, spectral Doppler, color Doppler, and power Doppler as needed of all accessible portions of each vessel. Bilateral testing is considered an integral part of a complete examination. Limited examinations for reoccurring indications may be performed as noted. +-----------------+-------------+----------+--------------------------+  Right Cephalic    Diameter (cm) Depth (cm)          Findings           +-----------------+-------------+----------+--------------------------+  Shoulder              0.36         0.71                                +-----------------+-------------+----------+--------------------------+  Prox upper arm         0.38         0.63                                +-----------------+-------------+----------+--------------------------+  Mid upper arm         0.32         0.36                                +-----------------+-------------+----------+--------------------------+  Dist upper arm        0.31         0.34                                +-----------------+-------------+----------+--------------------------+  Antecubital fossa     0.45  0.30                                +-----------------+-------------+----------+--------------------------+  Prox forearm                               not visualized and IV/TAPE  +-----------------+-------------+----------+--------------------------+  Mid forearm           0.36         0.43                                +-----------------+-------------+----------+--------------------------+  Dist forearm          0.31         0.40                                +-----------------+-------------+----------+--------------------------+  Wrist                 0.25         0.51            branching           +-----------------+-------------+----------+--------------------------+ +-----------------+-------------+----------+---------+  Right Basilic     Diameter (cm) Depth (cm) Findings   +-----------------+-------------+----------+---------+  Prox upper arm        0.70                            +-----------------+-------------+----------+---------+  Mid upper arm         0.61                            +-----------------+-------------+----------+---------+  Dist upper arm        0.52                            +-----------------+-------------+----------+---------+  Antecubital fossa     0.58                            +-----------------+-------------+----------+---------+  Prox forearm          0.37                 branching  +-----------------+-------------+----------+---------+  Mid forearm           0.32                             +-----------------+-------------+----------+---------+  Distal forearm        0.27                            +-----------------+-------------+----------+---------+  Wrist                 0.20                            +-----------------+-------------+----------+---------+ +-----------------+-------------+----------+---------+  Left Cephalic     Diameter (cm) Depth (cm) Findings   +-----------------+-------------+----------+---------+  Shoulder              0.37  0.45               +-----------------+-------------+----------+---------+  Prox upper arm        0.27         0.28               +-----------------+-------------+----------+---------+  Mid upper arm         0.28         0.28               +-----------------+-------------+----------+---------+  Dist upper arm        0.32         0.48               +-----------------+-------------+----------+---------+  Antecubital fossa     0.39         0.53    branching  +-----------------+-------------+----------+---------+  Prox forearm          0.26         0.52               +-----------------+-------------+----------+---------+  Mid forearm           0.32         0.47               +-----------------+-------------+----------+---------+  Dist forearm          0.27         0.55               +-----------------+-------------+----------+---------+  Wrist                 0.33         0.45    branching  +-----------------+-------------+----------+---------+ +-----------------+-------------+----------+---------+  Left Basilic      Diameter (cm) Depth (cm) Findings   +-----------------+-------------+----------+---------+  Prox upper arm        0.56                            +-----------------+-------------+----------+---------+  Mid upper arm         0.55                            +-----------------+-------------+----------+---------+  Dist upper arm        0.58                            +-----------------+-------------+----------+---------+  Antecubital fossa      0.48                 branching  +-----------------+-------------+----------+---------+  Prox forearm          0.27                            +-----------------+-------------+----------+---------+  Mid forearm           0.27                            +-----------------+-------------+----------+---------+  Distal forearm        0.27                            +-----------------+-------------+----------+---------+  Wrist  0.32                            +-----------------+-------------+----------+---------+ *See table(s) above for measurements and observations.  Diagnosing physician: Harold Barban MD Electronically signed by Harold Barban MD on 04/02/2021 at 8:54:40 PM.    Final     DISCHARGE EXAMINATION: Vitals:   04/10/21 2100 04/10/21 2201 04/11/21 0300 04/11/21 0401  BP:  (!) 159/60 (!) 162/57 (!) 157/56  Pulse:  80    Resp:      Temp: 98.3 F (36.8 C)     TempSrc: Oral     SpO2:  98%  99%  Weight:      Height:       General appearance: Awake alert.  In no distress Resp: Clear to auscultation bilaterally.  Normal effort Cardio: S1-S2 is normal regular.  No S3-S4.  No rubs murmurs or bruit GI: Abdomen is soft.  Nontender nondistended.  Bowel sounds are present normal.  No masses organomegaly Extremities: Swelling of the right arm is stable.   DISPOSITION: SNF  Discharge Instructions     Call MD for:  difficulty breathing, headache or visual disturbances   Complete by: As directed    Call MD for:  difficulty breathing, headache or visual disturbances   Complete by: As directed    Call MD for:  extreme fatigue   Complete by: As directed    Call MD for:  extreme fatigue   Complete by: As directed    Call MD for:  persistant dizziness or light-headedness   Complete by: As directed    Call MD for:  persistant dizziness or light-headedness   Complete by: As directed    Call MD for:  persistant nausea and vomiting   Complete by: As directed    Call MD for:  persistant  nausea and vomiting   Complete by: As directed    Call MD for:  severe uncontrolled pain   Complete by: As directed    Call MD for:  severe uncontrolled pain   Complete by: As directed    Call MD for:  temperature >100.4   Complete by: As directed    Call MD for:  temperature >100.4   Complete by: As directed    Diet - low sodium heart healthy   Complete by: As directed    Discharge instructions   Complete by: As directed    Please review instructions on the discharge summary.  You were cared for by a hospitalist during your hospital stay. If you have any questions about your discharge medications or the care you received while you were in the hospital after you are discharged, you can call the unit and asked to speak with the hospitalist on call if the hospitalist that took care of you is not available. Once you are discharged, your primary care physician will handle any further medical issues. Please note that NO REFILLS for any discharge medications will be authorized once you are discharged, as it is imperative that you return to your primary care physician (or establish a relationship with a primary care physician if you do not have one) for your aftercare needs so that they can reassess your need for medications and monitor your lab values. If you do not have a primary care physician, you can call [phone] for a physician referral.   Discharge instructions   Complete by: As directed    Please review instructions on the discharge  summary.  You were cared for by a hospitalist during your hospital stay. If you have any questions about your discharge medications or the care you received while you were in the hospital after you are discharged, you can call the unit and asked to speak with the hospitalist on call if the hospitalist that took care of you is not available. Once you are discharged, your primary care physician will handle any further medical issues. Please note that NO REFILLS for  any discharge medications will be authorized once you are discharged, as it is imperative that you return to your primary care physician (or establish a relationship with a primary care physician if you do not have one) for your aftercare needs so that they can reassess your need for medications and monitor your lab values. If you do not have a primary care physician, you can call [phone] for a physician referral.   Increase activity slowly   Complete by: As directed    Increase activity slowly   Complete by: As directed    No wound care   Complete by: As directed    No wound care   Complete by: As directed          Allergies as of 04/11/2021       Reactions   Bee Venom Anaphylaxis   Has epi pen   Influenza Vaccines Other (See Comments)   "Mortally sick for 2 weeks"        Medication List     STOP taking these medications    amLODipine 10 MG tablet Commonly known as: NORVASC   HYDROcodone-acetaminophen 10-325 MG tablet Commonly known as: NORCO Replaced by: HYDROcodone-acetaminophen 5-325 MG tablet   metoprolol tartrate 25 MG tablet Commonly known as: LOPRESSOR       TAKE these medications    acetaminophen 325 MG tablet Commonly known as: TYLENOL Take 2 tablets (650 mg total) by mouth every 6 (six) hours as needed for mild pain (or Fever >/= 101).   albuterol 108 (90 Base) MCG/ACT inhaler Commonly known as: VENTOLIN HFA Inhale 1 puff into the lungs every 6 (six) hours as needed for shortness of breath.   aspirin EC 81 MG tablet Take 81 mg by mouth daily.   cloNIDine 0.1 MG tablet Commonly known as: CATAPRES Take 1 tablet (0.1 mg total) by mouth daily.   diltiazem 180 MG 24 hr capsule Commonly known as: CARDIZEM CD Take 1 capsule (180 mg total) by mouth daily.   ferrous sulfate 325 (65 FE) MG tablet Take 1 tablet by mouth. Every Monday, Wednesday, and Friday   hydrALAZINE 10 MG tablet Commonly known as: APRESOLINE Take 1 tablet (10 mg total) by  mouth every 8 (eight) hours as needed (for SBP greater than 180).   HYDROcodone-acetaminophen 5-325 MG tablet Commonly known as: NORCO/VICODIN Take 1-2 tablets by mouth every 4 (four) hours as needed for moderate pain or severe pain. Replaces: HYDROcodone-acetaminophen 10-325 MG tablet   Ipratropium-Albuterol 20-100 MCG/ACT Aers respimat Commonly known as: COMBIVENT Inhale 1 puff into the lungs 4 (four) times daily.   lanthanum 1000 MG chewable tablet Commonly known as: FOSRENOL Chew 1 tablet (1,000 mg total) by mouth 3 (three) times daily with meals.   levothyroxine 50 MCG tablet Commonly known as: SYNTHROID Take 50 mcg by mouth daily before breakfast.   multivitamin tablet Take 1 tablet by mouth daily.   neomycin-bacitracin-polymyxin Oint Commonly known as: NEOSPORIN Apply 1 application topically 3 (three) times daily. To right antecubital area  ondansetron 4 MG tablet Commonly known as: Zofran Take 1 tablet (4 mg total) by mouth daily as needed for nausea or vomiting.   pantoprazole 40 MG tablet Commonly known as: PROTONIX Take 1 tablet (40 mg total) by mouth 2 (two) times daily.   polyethylene glycol 17 g packet Commonly known as: MIRALAX / GLYCOLAX Take 17 g by mouth daily as needed for mild constipation or moderate constipation.   pravastatin 40 MG tablet Commonly known as: PRAVACHOL Take 40 mg by mouth at bedtime.   tamsulosin 0.4 MG Caps capsule Commonly known as: FLOMAX Take 1 capsule (0.4 mg total) by mouth daily.          Follow-up Information     Clinic, [place]. Schedule an appointment as soon as possible for a visit in 3 week(s).   Contact information: [place] [address] [phone]         [place] [facility] Follow up on 04/28/2021.   Why: With Nicholes Rough on 04/28/21 at 1:30PM Contact information: [place]  [id number] [phone]                TOTAL DISCHARGE TIME: 32 minutes  [facility] Pager on [email or web address]  04/11/2021, 9:48 AM

## 2021-04-14 DIAGNOSIS — Z992 Dependence on renal dialysis: Secondary | ICD-10-CM | POA: Diagnosis not present

## 2021-04-14 DIAGNOSIS — N186 End stage renal disease: Secondary | ICD-10-CM | POA: Diagnosis not present

## 2021-04-14 DIAGNOSIS — A499 Bacterial infection, unspecified: Secondary | ICD-10-CM | POA: Diagnosis not present

## 2021-04-15 DIAGNOSIS — E782 Mixed hyperlipidemia: Secondary | ICD-10-CM | POA: Diagnosis not present

## 2021-04-15 DIAGNOSIS — I10 Essential (primary) hypertension: Secondary | ICD-10-CM | POA: Diagnosis not present

## 2021-04-15 DIAGNOSIS — I2510 Atherosclerotic heart disease of native coronary artery without angina pectoris: Secondary | ICD-10-CM | POA: Diagnosis not present

## 2021-04-15 DIAGNOSIS — M6281 Muscle weakness (generalized): Secondary | ICD-10-CM | POA: Diagnosis not present

## 2021-04-15 DIAGNOSIS — N186 End stage renal disease: Secondary | ICD-10-CM | POA: Diagnosis not present

## 2021-04-15 DIAGNOSIS — I120 Hypertensive chronic kidney disease with stage 5 chronic kidney disease or end stage renal disease: Secondary | ICD-10-CM | POA: Diagnosis not present

## 2021-04-15 DIAGNOSIS — D638 Anemia in other chronic diseases classified elsewhere: Secondary | ICD-10-CM | POA: Diagnosis not present

## 2021-04-15 DIAGNOSIS — E039 Hypothyroidism, unspecified: Secondary | ICD-10-CM | POA: Diagnosis not present

## 2021-04-15 DIAGNOSIS — K259 Gastric ulcer, unspecified as acute or chronic, without hemorrhage or perforation: Secondary | ICD-10-CM | POA: Diagnosis not present

## 2021-04-15 DIAGNOSIS — I639 Cerebral infarction, unspecified: Secondary | ICD-10-CM | POA: Diagnosis not present

## 2021-04-15 DIAGNOSIS — J449 Chronic obstructive pulmonary disease, unspecified: Secondary | ICD-10-CM | POA: Diagnosis not present

## 2021-04-15 DIAGNOSIS — R110 Nausea: Secondary | ICD-10-CM | POA: Diagnosis not present

## 2021-04-16 DIAGNOSIS — A499 Bacterial infection, unspecified: Secondary | ICD-10-CM | POA: Diagnosis not present

## 2021-04-16 DIAGNOSIS — Z992 Dependence on renal dialysis: Secondary | ICD-10-CM | POA: Diagnosis not present

## 2021-04-16 DIAGNOSIS — N186 End stage renal disease: Secondary | ICD-10-CM | POA: Diagnosis not present

## 2021-04-17 DIAGNOSIS — E039 Hypothyroidism, unspecified: Secondary | ICD-10-CM | POA: Diagnosis not present

## 2021-04-17 DIAGNOSIS — L03119 Cellulitis of unspecified part of limb: Secondary | ICD-10-CM | POA: Diagnosis not present

## 2021-04-17 DIAGNOSIS — L97819 Non-pressure chronic ulcer of other part of right lower leg with unspecified severity: Secondary | ICD-10-CM | POA: Diagnosis not present

## 2021-04-17 DIAGNOSIS — D638 Anemia in other chronic diseases classified elsewhere: Secondary | ICD-10-CM | POA: Diagnosis not present

## 2021-04-17 DIAGNOSIS — N186 End stage renal disease: Secondary | ICD-10-CM | POA: Diagnosis not present

## 2021-04-17 DIAGNOSIS — I120 Hypertensive chronic kidney disease with stage 5 chronic kidney disease or end stage renal disease: Secondary | ICD-10-CM | POA: Diagnosis not present

## 2021-04-17 DIAGNOSIS — E559 Vitamin D deficiency, unspecified: Secondary | ICD-10-CM | POA: Diagnosis not present

## 2021-04-17 DIAGNOSIS — R600 Localized edema: Secondary | ICD-10-CM | POA: Diagnosis not present

## 2021-04-17 DIAGNOSIS — J449 Chronic obstructive pulmonary disease, unspecified: Secondary | ICD-10-CM | POA: Diagnosis not present

## 2021-04-18 ENCOUNTER — Encounter (HOSPITAL_COMMUNITY): Payer: Self-pay | Admitting: Internal Medicine

## 2021-04-18 ENCOUNTER — Emergency Department (HOSPITAL_COMMUNITY): Payer: No Typology Code available for payment source

## 2021-04-18 ENCOUNTER — Telehealth: Payer: Self-pay | Admitting: *Deleted

## 2021-04-18 ENCOUNTER — Inpatient Hospital Stay (HOSPITAL_COMMUNITY)
Admission: EM | Admit: 2021-04-18 | Discharge: 2021-04-24 | DRG: 314 | Disposition: A | Discharge status: Skilled Nursing Facility | Payer: No Typology Code available for payment source | Source: Skilled Nursing Facility | Attending: Internal Medicine | Admitting: Internal Medicine

## 2021-04-18 DIAGNOSIS — Z85528 Personal history of other malignant neoplasm of kidney: Secondary | ICD-10-CM

## 2021-04-18 DIAGNOSIS — K861 Other chronic pancreatitis: Secondary | ICD-10-CM | POA: Diagnosis not present

## 2021-04-18 DIAGNOSIS — I6529 Occlusion and stenosis of unspecified carotid artery: Secondary | ICD-10-CM | POA: Diagnosis present

## 2021-04-18 DIAGNOSIS — R339 Retention of urine, unspecified: Secondary | ICD-10-CM | POA: Diagnosis present

## 2021-04-18 DIAGNOSIS — Z8673 Personal history of transient ischemic attack (TIA), and cerebral infarction without residual deficits: Secondary | ICD-10-CM

## 2021-04-18 DIAGNOSIS — B965 Pseudomonas (aeruginosa) (mallei) (pseudomallei) as the cause of diseases classified elsewhere: Secondary | ICD-10-CM | POA: Diagnosis present

## 2021-04-18 DIAGNOSIS — N186 End stage renal disease: Secondary | ICD-10-CM | POA: Diagnosis present

## 2021-04-18 DIAGNOSIS — Z992 Dependence on renal dialysis: Secondary | ICD-10-CM

## 2021-04-18 DIAGNOSIS — T827XXA Infection and inflammatory reaction due to other cardiac and vascular devices, implants and grafts, initial encounter: Secondary | ICD-10-CM | POA: Diagnosis not present

## 2021-04-18 DIAGNOSIS — G8929 Other chronic pain: Secondary | ICD-10-CM | POA: Diagnosis present

## 2021-04-18 DIAGNOSIS — E1122 Type 2 diabetes mellitus with diabetic chronic kidney disease: Secondary | ICD-10-CM | POA: Diagnosis present

## 2021-04-18 DIAGNOSIS — A499 Bacterial infection, unspecified: Secondary | ICD-10-CM | POA: Diagnosis not present

## 2021-04-18 DIAGNOSIS — R0602 Shortness of breath: Secondary | ICD-10-CM

## 2021-04-18 DIAGNOSIS — E7849 Other hyperlipidemia: Secondary | ICD-10-CM

## 2021-04-18 DIAGNOSIS — A419 Sepsis, unspecified organism: Secondary | ICD-10-CM

## 2021-04-18 DIAGNOSIS — I2510 Atherosclerotic heart disease of native coronary artery without angina pectoris: Secondary | ICD-10-CM | POA: Diagnosis present

## 2021-04-18 DIAGNOSIS — Z8249 Family history of ischemic heart disease and other diseases of the circulatory system: Secondary | ICD-10-CM

## 2021-04-18 DIAGNOSIS — I10 Essential (primary) hypertension: Secondary | ICD-10-CM | POA: Diagnosis present

## 2021-04-18 DIAGNOSIS — I480 Paroxysmal atrial fibrillation: Secondary | ICD-10-CM | POA: Diagnosis present

## 2021-04-18 DIAGNOSIS — I252 Old myocardial infarction: Secondary | ICD-10-CM

## 2021-04-18 DIAGNOSIS — J449 Chronic obstructive pulmonary disease, unspecified: Secondary | ICD-10-CM | POA: Diagnosis not present

## 2021-04-18 DIAGNOSIS — Z955 Presence of coronary angioplasty implant and graft: Secondary | ICD-10-CM

## 2021-04-18 DIAGNOSIS — J441 Chronic obstructive pulmonary disease with (acute) exacerbation: Secondary | ICD-10-CM | POA: Diagnosis present

## 2021-04-18 DIAGNOSIS — D631 Anemia in chronic kidney disease: Secondary | ICD-10-CM | POA: Diagnosis present

## 2021-04-18 DIAGNOSIS — Z87891 Personal history of nicotine dependence: Secondary | ICD-10-CM

## 2021-04-18 DIAGNOSIS — M1990 Unspecified osteoarthritis, unspecified site: Secondary | ICD-10-CM | POA: Diagnosis present

## 2021-04-18 DIAGNOSIS — E876 Hypokalemia: Secondary | ICD-10-CM | POA: Diagnosis present

## 2021-04-18 DIAGNOSIS — T82898A Other specified complication of vascular prosthetic devices, implants and grafts, initial encounter: Secondary | ICD-10-CM

## 2021-04-18 DIAGNOSIS — D509 Iron deficiency anemia, unspecified: Secondary | ICD-10-CM | POA: Diagnosis present

## 2021-04-18 DIAGNOSIS — M545 Low back pain, unspecified: Secondary | ICD-10-CM | POA: Diagnosis present

## 2021-04-18 DIAGNOSIS — Z22322 Carrier or suspected carrier of Methicillin resistant Staphylococcus aureus: Secondary | ICD-10-CM

## 2021-04-18 DIAGNOSIS — N2581 Secondary hyperparathyroidism of renal origin: Secondary | ICD-10-CM | POA: Diagnosis present

## 2021-04-18 DIAGNOSIS — Z20822 Contact with and (suspected) exposure to covid-19: Secondary | ICD-10-CM | POA: Diagnosis present

## 2021-04-18 DIAGNOSIS — I1311 Hypertensive heart and chronic kidney disease without heart failure, with stage 5 chronic kidney disease, or end stage renal disease: Secondary | ICD-10-CM | POA: Diagnosis present

## 2021-04-18 DIAGNOSIS — N390 Urinary tract infection, site not specified: Secondary | ICD-10-CM | POA: Diagnosis present

## 2021-04-18 DIAGNOSIS — N185 Chronic kidney disease, stage 5: Secondary | ICD-10-CM

## 2021-04-18 DIAGNOSIS — T8131XA Disruption of external operation (surgical) wound, not elsewhere classified, initial encounter: Secondary | ICD-10-CM | POA: Diagnosis present

## 2021-04-18 DIAGNOSIS — I4891 Unspecified atrial fibrillation: Secondary | ICD-10-CM

## 2021-04-18 DIAGNOSIS — Y832 Surgical operation with anastomosis, bypass or graft as the cause of abnormal reaction of the patient, or of later complication, without mention of misadventure at the time of the procedure: Secondary | ICD-10-CM | POA: Diagnosis present

## 2021-04-18 DIAGNOSIS — F419 Anxiety disorder, unspecified: Secondary | ICD-10-CM | POA: Diagnosis present

## 2021-04-18 DIAGNOSIS — E039 Hypothyroidism, unspecified: Secondary | ICD-10-CM | POA: Diagnosis present

## 2021-04-18 HISTORY — DX: Abnormal findings on diagnostic imaging of other parts of digestive tract: R93.3

## 2021-04-18 HISTORY — DX: Infection and inflammatory reaction due to other cardiac and vascular devices, implants and grafts, initial encounter: T82.7XXA

## 2021-04-18 HISTORY — DX: Acute posthemorrhagic anemia: D62

## 2021-04-18 LAB — URINALYSIS, ROUTINE W REFLEX MICROSCOPIC
Bilirubin Urine: NEGATIVE
Glucose, UA: NEGATIVE mg/dL
Ketones, ur: NEGATIVE mg/dL
Nitrite: NEGATIVE
Protein, ur: >=300 mg/dL — AB
RBC / HPF: >50 RBC/hpf — ABNORMAL HIGH (ref 0–5)
Specific Gravity, Urine: 1.029 (ref 1.005–1.030)
WBC, UA: >50 WBC/hpf — ABNORMAL HIGH (ref 0–5)
pH: 5 (ref 5.0–8.0)

## 2021-04-18 LAB — CBC WITH DIFFERENTIAL/PLATELET
Abs Immature Granulocytes: 0.04 10*3/uL (ref 0.00–0.07)
Basophils Absolute: 0.1 10*3/uL (ref 0.0–0.1)
Basophils Relative: 1 %
Eosinophils Absolute: 0.2 10*3/uL (ref 0.0–0.5)
Eosinophils Relative: 2 %
HCT: 25.2 % — ABNORMAL LOW (ref 39.0–52.0)
Hemoglobin: 7.9 g/dL — ABNORMAL LOW (ref 13.0–17.0)
Immature Granulocytes: 0 %
Lymphocytes Relative: 14 %
Lymphs Abs: 1.7 10*3/uL (ref 0.7–4.0)
MCH: 29 pg (ref 26.0–34.0)
MCHC: 31.3 g/dL (ref 30.0–36.0)
MCV: 92.6 fL (ref 80.0–100.0)
Monocytes Absolute: 1.1 10*3/uL — ABNORMAL HIGH (ref 0.1–1.0)
Monocytes Relative: 9 %
Neutro Abs: 9.3 10*3/uL — ABNORMAL HIGH (ref 1.7–7.7)
Neutrophils Relative %: 74 %
Platelets: 350 10*3/uL (ref 150–400)
RBC: 2.72 MIL/uL — ABNORMAL LOW (ref 4.22–5.81)
RDW: 16.2 % — ABNORMAL HIGH (ref 11.5–15.5)
WBC: 12.4 10*3/uL — ABNORMAL HIGH (ref 4.0–10.5)
nRBC: 0 % (ref 0.0–0.2)

## 2021-04-18 LAB — APTT: aPTT: 34 seconds (ref 24–36)

## 2021-04-18 LAB — COMPREHENSIVE METABOLIC PANEL
ALT: 7 U/L (ref 0–44)
AST: 18 U/L (ref 15–41)
Albumin: 2.2 g/dL — ABNORMAL LOW (ref 3.5–5.0)
Alkaline Phosphatase: 84 U/L (ref 38–126)
Anion gap: 10 (ref 5–15)
BUN: 11 mg/dL (ref 8–23)
CO2: 30 mmol/L (ref 22–32)
Calcium: 8.2 mg/dL — ABNORMAL LOW (ref 8.9–10.3)
Chloride: 97 mmol/L — ABNORMAL LOW (ref 98–111)
Creatinine, Ser: 3.28 mg/dL — ABNORMAL HIGH (ref 0.61–1.24)
GFR, Estimated: 18 mL/min — ABNORMAL LOW (ref >60)
Glucose, Bld: 116 mg/dL — ABNORMAL HIGH (ref 70–99)
Potassium: 2.9 mmol/L — ABNORMAL LOW (ref 3.5–5.1)
Sodium: 137 mmol/L (ref 135–145)
Total Bilirubin: 0.3 mg/dL (ref 0.3–1.2)
Total Protein: 6 g/dL — ABNORMAL LOW (ref 6.5–8.1)

## 2021-04-18 LAB — PROTIME-INR
INR: 1.1 (ref 0.8–1.2)
Prothrombin Time: 14.1 seconds (ref 11.4–15.2)

## 2021-04-18 LAB — LACTIC ACID, PLASMA
Lactic Acid, Venous: 2 mmol/L (ref 0.5–1.9)
Lactic Acid, Venous: 1.2 mmol/L (ref 0.5–1.9)

## 2021-04-18 LAB — MAGNESIUM: Magnesium: 1.5 mg/dL — ABNORMAL LOW (ref 1.7–2.4)

## 2021-04-18 MED ORDER — SODIUM CHLORIDE 0.9 % IV SOLN
1.0000 g | INTRAVENOUS | Status: DC
  Administered 2021-04-19 – 2021-04-20 (×2): 1 g via INTRAVENOUS
  Filled 2021-04-18 (×3): qty 1

## 2021-04-18 MED ORDER — LEVOTHYROXINE SODIUM 50 MCG PO TABS
50.0000 ug | ORAL_TABLET | Freq: Every day | ORAL | Status: DC
  Administered 2021-04-20 – 2021-04-24 (×5): 50 ug via ORAL
  Filled 2021-04-18 (×7): qty 1

## 2021-04-18 MED ORDER — DILTIAZEM HCL ER COATED BEADS 180 MG PO CP24
180.0000 mg | ORAL_CAPSULE | Freq: Every day | ORAL | Status: DC
  Administered 2021-04-19 – 2021-04-24 (×6): 180 mg via ORAL
  Filled 2021-04-18 (×6): qty 1

## 2021-04-18 MED ORDER — IPRATROPIUM-ALBUTEROL 20-100 MCG/ACT IN AERS: 1.0000 | INHALATION_SPRAY | Freq: Four times a day (QID) | RESPIRATORY_TRACT | Status: DC

## 2021-04-18 MED ORDER — ASPIRIN EC 81 MG PO TBEC
81.0000 mg | DELAYED_RELEASE_TABLET | Freq: Every day | ORAL | Status: DC
Start: 2021-04-19 — End: 2021-04-19
  Administered 2021-04-19: 81 mg via ORAL
  Filled 2021-04-18: qty 1

## 2021-04-18 MED ORDER — LANTHANUM CARBONATE 500 MG PO CHEW
1000.0000 mg | CHEWABLE_TABLET | Freq: Three times a day (TID) | ORAL | Status: DC
  Administered 2021-04-19 – 2021-04-23 (×4): 1000 mg via ORAL
  Filled 2021-04-18 (×10): qty 2

## 2021-04-18 MED ORDER — VANCOMYCIN HCL 1750 MG/350ML IV SOLN
1750.0000 mg | Freq: Once | INTRAVENOUS | Status: DC
  Filled 2021-04-18: qty 350

## 2021-04-18 MED ORDER — ACETAMINOPHEN 650 MG RE SUPP: 650.0000 mg | Freq: Four times a day (QID) | RECTAL | Status: DC | PRN

## 2021-04-18 MED ORDER — HYDROCODONE-ACETAMINOPHEN 5-325 MG PO TABS
1.0000 | ORAL_TABLET | ORAL | Status: DC | PRN
  Administered 2021-04-19 – 2021-04-21 (×8): 2 via ORAL
  Administered 2021-04-22: 1 via ORAL
  Administered 2021-04-23 – 2021-04-24 (×3): 2 via ORAL
  Filled 2021-04-18: qty 2
  Filled 2021-04-18: qty 1
  Filled 2021-04-18 (×10): qty 2

## 2021-04-18 MED ORDER — IPRATROPIUM-ALBUTEROL 0.5-2.5 (3) MG/3ML IN SOLN
3.0000 mL | Freq: Four times a day (QID) | RESPIRATORY_TRACT | Status: DC
  Administered 2021-04-18 – 2021-04-21 (×10): 3 mL via RESPIRATORY_TRACT
  Filled 2021-04-18 (×11): qty 3

## 2021-04-18 MED ORDER — ALBUTEROL SULFATE (2.5 MG/3ML) 0.083% IN NEBU
3.0000 mL | INHALATION_SOLUTION | Freq: Four times a day (QID) | RESPIRATORY_TRACT | Status: DC | PRN
  Administered 2021-04-21: 3 mL via RESPIRATORY_TRACT
  Filled 2021-04-18: qty 3

## 2021-04-18 MED ORDER — SODIUM CHLORIDE 0.9% FLUSH
3.0000 mL | Freq: Two times a day (BID) | INTRAVENOUS | Status: DC
  Administered 2021-04-18 – 2021-04-24 (×7): 3 mL via INTRAVENOUS

## 2021-04-18 MED ORDER — PANTOPRAZOLE SODIUM 40 MG PO TBEC
40.0000 mg | DELAYED_RELEASE_TABLET | Freq: Two times a day (BID) | ORAL | Status: DC
  Administered 2021-04-18 – 2021-04-24 (×10): 40 mg via ORAL
  Filled 2021-04-18 (×12): qty 1

## 2021-04-18 MED ORDER — POTASSIUM CHLORIDE 20 MEQ PO PACK
20.0000 meq | PACK | Freq: Once | ORAL | Status: AC
  Administered 2021-04-18: 20 meq via ORAL
  Filled 2021-04-18: qty 1

## 2021-04-18 MED ORDER — SODIUM CHLORIDE 0.9 % IV SOLN: 2.0000 g | Freq: Once | INTRAVENOUS | Status: DC

## 2021-04-18 MED ORDER — PRAVASTATIN SODIUM 40 MG PO TABS
40.0000 mg | ORAL_TABLET | Freq: Every day | ORAL | Status: DC
  Administered 2021-04-18 – 2021-04-23 (×6): 40 mg via ORAL
  Filled 2021-04-18 (×6): qty 1

## 2021-04-18 MED ORDER — ACETAMINOPHEN 325 MG PO TABS
650.0000 mg | ORAL_TABLET | Freq: Four times a day (QID) | ORAL | Status: DC | PRN
  Administered 2021-04-23: 650 mg via ORAL
  Filled 2021-04-18: qty 2

## 2021-04-18 NOTE — Consult Note (Signed)
Hospital Consult    Reason for Consult:  Concern for infection right arm AVF Referring Physician:  ED MRN #:  [id number]  History of Present Illness: This is a 87 y.o. male with end-stage renal disease that presents for evaluation of his right arm AV fistula.  Patient had a right brachiocephalic placed by Dr. Stanford Breed on 04/03/2021.  Patient states that the incision started draining approximately 5 days after surgery.  It has been persistently draining.  He was sent to the ED for antibiotics.  Currently using RIJ tunneled catheter for dialysis.  Past Medical History:  Diagnosis Date   Arthritis    Carotid stenosis    COPD (chronic obstructive pulmonary disease) ([place])    Coronary artery disease    S/p PCI 2011;  NSTEMI 12/12:  LHC/PCI 02/23/11: LAD 60% after the septal perforator, D1 occluded with distal collaterals, proximal RI 30-40%, AV circumflex stent patent with 60% stenosis after the stent, RCA 99%, EF 60-65%.  His RCA was treated with a bare-metal stent   CVA (cerebral infarction) 2011   Right cerebral; total obstruction of the right ICA   Diverticulitis    Hypertension    Rectal bleeding 07/2015   Renal carcinoma (HCC)    Stroke ([place])    Tobacco abuse, in remission     Past Surgical History:  Procedure Laterality Date   AV FISTULA PLACEMENT Right 04/03/2021   Procedure: ARTERIOVENOUS (AV) CREATION OF RIGHT ARM BRACHIOCEPHALIC FISTULA;  Surgeon: Cherre Robins, MD;  Location: [place];  Service: Vascular;  Laterality: Right;   BIOPSY  04/08/2021   Procedure: BIOPSY;  Surgeon: Lavena Bullion, DO;  Location: [place];  Service: Gastroenterology;;   COLON SURGERY     COLONOSCOPY N/A 08/22/2015   Procedure: COLONOSCOPY;  Surgeon: [name], MD;  Location: [facility] [place] [facility] [place] [facility];  Service: Endoscopy;  Laterality: N/A;   ESOPHAGOGASTRODUODENOSCOPY N/A 07/18/2020   Procedure: ESOPHAGOGASTRODUODENOSCOPY (EGD);  Surgeon: Milus Banister, MD;  Location: [facility];  Service:  Endoscopy;  Laterality: N/A;   ESOPHAGOGASTRODUODENOSCOPY (EGD) WITH PROPOFOL N/A 06/21/2020   Procedure: ESOPHAGOGASTRODUODENOSCOPY (EGD) WITH PROPOFOL;  Surgeon: Irene Shipper, MD;  Location: [facility] ENDOSCOPY;  Service: Endoscopy;  Laterality: N/A;   ESOPHAGOGASTRODUODENOSCOPY (EGD) WITH PROPOFOL N/A 04/08/2021   Procedure: ESOPHAGOGASTRODUODENOSCOPY (EGD) WITH PROPOFOL;  Surgeon: Lavena Bullion, DO;  Location: [place];  Service: Gastroenterology;  Laterality: N/A;   EUS N/A 07/18/2020   Procedure: UPPER ENDOSCOPIC ULTRASOUND (EUS) RADIAL;  Surgeon: Milus Banister, MD;  Location: [facility] END[facility];  Service: Endoscopy;  Laterality: N/A;   FINE NEEDLE ASPIRATION N/A 07/18/2020   Procedure: FINE NEEDLE ASPIRATION (FNA) LINEAR;  Surgeon: Milus Banister, MD;  Location: [facility] ENDOSCOPY;  Service: Endoscopy;  Laterality: N/A;   HEMOSTASIS CLIP PLACEMENT  04/08/2021   Procedure: HEMOSTASIS CLIP PLACEMENT;  Surgeon: Lavena Bullion, DO;  Location: [place];  Service: Gastroenterology;;   hip relacement     IR FLUORO GUIDE CV LINE RIGHT  03/31/2021   IR US GUIDE VASC ACCESS RIGHT  03/31/2021   KIDNEY SURGERY     LEFT HEART CATH AND CORONARY ANGIOGRAPHY N/A 07/09/2020   Procedure: LEFT HEART CATH AND CORONARY ANGIOGRAPHY;  Surgeon: Leonie Man, MD;  Location: [place] CV LAB;  Service: Cardiovascular;  Laterality: N/A;   LEFT HEART CATHETERIZATION WITH CORONARY ANGIOGRAM N/A 02/23/2011   Procedure: LEFT HEART CATHETERIZATION WITH CORONARY ANGIOGRAM;  Surgeon: [name], MD;  Location[facility] C[facility] LAB;  Service: Cardiovascular;  Laterality: N/A;  LEFT HEART CATHETERIZATION WITH CORONARY ANGIOGRAM N/A 03/18/2011   Procedure: LEFT HEART CATHETERIZATION WITH CORONARY ANGIOGRAM;  Surgeon: [name], MD;  Location: [facility] CATH LAB;  Service: Cardiovascular;  Laterality: N/A;   PERCUTANEOUS CORONARY STENT INTERVENTION (PCI-S) N/A 02/23/2011   Procedure: PERCUTANEOUS CORONARY STENT INTERVENTION  (PCI-S);  Surgeon: [name], MD;  Location: [facility] CATH LAB;  Service: Cardiovascular;  Laterality: N/A;   TEMPORARY PACEMAKER INSERTION N/A 02/23/2011   Procedure: TEMPORARY PACEMAKER INSERTION;  Surgeon: [name], MD;  Location: [facility] CATH LAB;  Service: Cardiovascular;  Laterality: N/A;   THROMBECTOMY W/ EMBOLECTOMY Right 04/03/2021   Procedure: THROMBECTOMY ARTERIOVENOUS FISTULA;  Surgeon: Cherre Robins, MD;  Location: [place];  Service: Vascular;  Laterality: Right;    Allergies  Allergen Reactions   Bee Venom Anaphylaxis    Has epi pen   Influenza Vaccines Other (See Comments)    "Mortally sick for 2 weeks"    Prior to Admission medications   Medication Sig Start Date End Date Taking? Authorizing Provider  acetaminophen (TYLENOL) 325 MG tablet Take 2 tablets (650 mg total) by mouth every 6 (six) hours as needed for mild pain (or Fever >/= 101). 04/10/21   Bonnielee Haff, MD  albuterol (VENTOLIN HFA) 108 (90 Base) MCG/ACT inhaler Inhale 1 puff into the lungs every 6 (six) hours as needed for shortness of breath.    [provider]  aspirin EC 81 MG tablet Take 81 mg by mouth daily. Patient not taking: Reported on 04/06/2021    [provider]  cloNIDine (CATAPRES) 0.1 MG tablet Take 1 tablet (0.1 mg total) by mouth daily. Patient not taking: Reported on 04/06/2021 06/24/20   Harvie Heck, MD  diltiazem (CARDIZEM CD) 180 MG 24 hr capsule Take 1 capsule (180 mg total) by mouth daily. 04/11/21   Bonnielee Haff, MD  ferrous sulfate 325 (65 FE) MG tablet Take 1 tablet by mouth. Every Monday, Wednesday, and Friday Patient not taking: Reported on 04/06/2021 07/30/20   [provider]  hydrALAZINE (APRESOLINE) 10 MG tablet Take 1 tablet (10 mg total) by mouth every 8 (eight) hours as needed (for SBP greater than 180). 04/04/21   Little Ishikawa, MD  HYDROcodone-acetaminophen (NORCO/VICODIN) 5-325 MG tablet Take 1-2 tablets by mouth every 4 (four) hours as needed  for moderate pain or severe pain. 04/10/21   Bonnielee Haff, MD  Ipratropium-Albuterol (COMBIVENT) 20-100 MCG/ACT AERS respimat Inhale 1 puff into the lungs 4 (four) times daily.    [provider]  lanthanum (FOSRENOL) 1000 MG chewable tablet Chew 1 tablet (1,000 mg total) by mouth 3 (three) times daily with meals. 04/04/21   Little Ishikawa, MD  levothyroxine (SYNTHROID) 50 MCG tablet Take 50 mcg by mouth daily before breakfast. Patient not taking: Reported on 04/06/2021    [provider]  Multiple Vitamin (MULTIVITAMIN) tablet Take 1 tablet by mouth daily. Patient not taking: Reported on 04/06/2021    [provider]  neomycin-bacitracin-polymyxin (NEOSPORIN) OINT Apply 1 application topically 3 (three) times daily. To right antecubital area 04/10/21   Bonnielee Haff, MD  ondansetron (ZOFRAN) 4 MG tablet Take 1 tablet (4 mg total) by mouth daily as needed for nausea or vomiting. 04/11/21 04/11/22  Bonnielee Haff, MD  pantoprazole (PROTONIX) 40 MG tablet Take 1 tablet (40 mg total) by mouth 2 (two) times daily. 04/10/21   Bonnielee Haff, MD  polyethylene glycol (MIRALAX / GLYCOLAX) 17 g packet Take 17 g by mouth daily as needed  for mild constipation or moderate constipation. Patient not taking: Reported on 04/06/2021 08/15/20   [name], MD  pravastatin (PRAVACHOL) 40 MG tablet Take 40 mg by mouth at bedtime.    [provider]  tamsulosin (FLOMAX) 0.4 MG CAPS capsule Take 1 capsule (0.4 mg total) by mouth daily. 04/11/21   Bonnielee Haff, MD    Social History   Socioeconomic History   Marital status: Divorced    Spouse name: Not on file   Number of children: Not on file   Years of education: Not on file   Highest education level: Not on file  Occupational History   Not on file  Tobacco Use   Smoking status: Former    Types: Cigarettes    Quit date: 02/24/2007    Years since quitting: 14.1   Smokeless tobacco: Never  Vaping Use   Vaping Use:  Never used  Substance and Sexual Activity   Alcohol use: Not Currently    Alcohol/week: 1.0 standard drink    Types: 1 Standard drinks or equivalent per week    Comment: socially    Drug use: No   Sexual activity: Not on file  Other Topics Concern   Not on file  Social History Narrative   Not on file   Social Determinants of Health   Financial Resource Strain: Not on file  Food Insecurity: Not on file  Transportation Needs: Not on file  Physical Activity: Not on file  Stress: Not on file  Social Connections: Not on file  Intimate Partner Violence: Not on file     Family History  Problem Relation Age of Onset   Heart attack Other 82    ROS: [x]  Positive   [ ]  Negative   [ ]  All sytems reviewed and are negative  Cardiovascular: []  chest pain/pressure []  palpitations []  SOB lying flat []  DOE []  pain in legs while walking []  pain in legs at rest []  pain in legs at night []  non-healing ulcers []  hx of DVT []  swelling in legs  Pulmonary: []  productive cough []  asthma/wheezing []  home O2  Neurologic: []  weakness in []  arms []  legs []  numbness in []  arms []  legs []  hx of CVA []  mini stroke [] difficulty speaking or slurred speech []  temporary loss of vision in one eye []  dizziness  Hematologic: []  hx of cancer []  bleeding problems []  problems with blood clotting easily  Endocrine:   []  diabetes []  thyroid disease  GI []  vomiting blood []  blood in stool  GU: []  CKD/renal failure []  HD--[]  M/W/F or []  T/T/S []  burning with urination []  blood in urine  Psychiatric: []  anxiety []  depression  Musculoskeletal: []  arthritis []  joint pain  Integumentary: []  rashes []  ulcers  Constitutional: []  fever []  chills   Physical Examination  Vitals:   04/18/21 1801  BP: (!) 148/53  Pulse: (!) 104  Resp: (!) 22  Temp: 98.3 F (36.8 C)  SpO2: 97%   There is no height or weight on file to calculate BMI.  General:  WDWN in NAD Gait: Not  observed HENT: WNL, normocephalic Pulmonary: normal non-labored breathing, without Rales, rhonchi,  wheezing Cardiac: regular, without  Murmurs, rubs or gallops;  Abdomen:  soft, NT/ND Vascular Exam/Pulses: Right arm AV fistula with good thrill and palpable radial pulse Incision at the antecubitum has an open sinus medially draining clear fluid Musculoskeletal: no muscle wasting or atrophy  Neurologic: A&O X 3; Appropriate Affect ; SENSATION: normal; MOTOR FUNCTION:  moving all  extremities equally. Speech is fluent/normal      CBC    Component Value Date/Time   WBC 12.4 (H) 04/18/2021 1820   RBC 2.72 (L) 04/18/2021 1820   HGB 7.9 (L) 04/18/2021 1820   HCT 25.2 (L) 04/18/2021 1820   PLT 350 04/18/2021 1820   MCV 92.6 04/18/2021 1820   MCH 29.0 04/18/2021 1820   MCHC 31.3 04/18/2021 1820   RDW 16.2 (H) 04/18/2021 1820   LYMPHSABS 1.7 04/18/2021 1820   MONOABS 1.1 (H) 04/18/2021 1820   EOSABS 0.2 04/18/2021 1820   BASOSABS 0.1 04/18/2021 1820    BMET    Component Value Date/Time   NA 137 04/18/2021 1820   K 2.9 (L) 04/18/2021 1820   CL 97 (L) 04/18/2021 1820   CO2 30 04/18/2021 1820   GLUCOSE 116 (H) 04/18/2021 1820   BUN 11 04/18/2021 1820   CREATININE 3.28 (H) 04/18/2021 1820   CALCIUM 8.2 (L) 04/18/2021 1820   GFRNONAA 18 (L) 04/18/2021 1820   GFRAA 52 (L) 07/31/2017 1630    COAGS: Lab Results  Component Value Date   INR 1.1 04/18/2021   INR 1.2 04/09/2021   INR 1.3 (H) 04/08/2021     Non-Invasive Vascular Imaging:    None  ASSESSMENT/PLAN: This is a 87 y.o. male with end-stage renal disease that presents for evaluation of his right arm AV fistula.  He has had breakdown of the incision at the antecubital area as pictured above and he states this started draining about 5 days after his right brachiocephalic AV fistula was placed.  He has fairly significant serous drainage.  Great thrill in the fistula.  I packed the wound with half-inch iodoform gauze in  the ED and will attempt meticulous local wound care.  May ultimately require washout in the OR.  Patient is being admitted to hospitalist and appreciate their assistance.  Marty Heck, MD [facility] Office: [place]

## 2021-04-18 NOTE — ED Notes (Signed)
This RN attempted IV x2 - MD notified and IV team orders placed

## 2021-04-18 NOTE — ED Notes (Signed)
Pt with chronic catheter in place. Pt urine very dark. MD Trilby Drummer aware at this time. Urine sample to be collected

## 2021-04-18 NOTE — ED Provider Triage Note (Signed)
Emergency Medicine Provider Triage Evaluation Note  CAMILA NORVILLE , a 87 y.o. male  was evaluated in triage.  Pt complains of shortness of breath and right arm infection.  Patient was sent from [facility] due to right arm cellulitis.  Patient was started on ceftazidime and vancomycin earlier today.  Patient also reports that he started having shortness of breath earlier today.  Patient denies any coughing.  Patient was noted to have wheezing on exam with EMS.  Received DuoNeb.  Patient was noted to have hematuria in his Foley catheter bag.  Is unsure how long this has been going on for.  Review of Systems  Positive: Shortness of breath, right arm infection, hematuria, wheezing, shortness of breath Negative: Fever, chills, abdominal pain, nausea, vomiting, diarrhea  Physical Exam  BP (!) 148/53 (BP Location: Left Arm)    Pulse (!) 104    Temp 98.3 F (36.8 C) (Oral)    Resp (!) 22    SpO2 97%  Gen:   Awake, no distress   Resp:  Normal effort, expiratory wheezing to all lung fields MSK:   Moves extremities without difficulty; redness and warmth to right upper extremity Other:  Abdomen protuberant, nondistended, nontender.  Blood noted in Foley catheter bag.  Medical Decision Making  Medically screening exam initiated at 6:11 PM.  Appropriate orders placed.  Felicita [name] was informed that the remainder of the evaluation will be completed by another provider, this initial triage assessment does not replace that evaluation, and the importance of remaining in the ED until their evaluation is complete.  Patient noted to be tachycardic and increased respiratory rate.  Known source of infection.  We will initiate code sepsis at this time.   [name], [place] 04/18/21 1816

## 2021-04-18 NOTE — Telephone Encounter (Signed)
Wayne Meadows, from [facility] called at 2:31 PM requesting an "urgent" appointment. The kidney center obtained a wound and blood culture and administered an antibiotic.  I discussed this with Dr Virl Cagey. I returned the call and was told the center had sent patient to the ED as the AVF appeared to have a "hole"  on the site.

## 2021-04-18 NOTE — ED Notes (Signed)
IV team at bedside 

## 2021-04-18 NOTE — ED Triage Notes (Signed)
Pt here from [facility] facility. Pt went to HD, completed full tx. Noticed right forearm warm to touch and swollen. Recent fissula placement which failed. Received Vancomyosin and ceftezidime IV. Marland Kitchen Also noted endwelling cath output is cloudy blood. Decreased output and increased bilateral flank pain. Wheezing lung sounds, received duoneb with EMS.   89% RA before tx 200/78 CBG 111 HR 94 RR 20

## 2021-04-18 NOTE — ED Provider Notes (Signed)
[facility] EMERGENCY DEPARTMENT Provider Note   CSN: [id number] Arrival date & time: 04/18/21  1757     History  Chief Complaint  Patient presents with   Arm infection     Wayne Meadows is a 87 y.o. male.  87 year old male presents with worsening infection to his right upper extremity.  History of end-stage renal disease on dialysis.  Had a fistula placed 2 weeks ago and has been on IV antibiotics of vancomycin and ceftazidime.  Wound continues to not improve.  Went to dialysis today and sent here for likely admission.  Patient had slight cough but does have history of COPD.  Denies any vomiting.      Home Medications Prior to Admission medications   Medication Sig Start Date End Date Taking? Authorizing Provider  acetaminophen (TYLENOL) 325 MG tablet Take 2 tablets (650 mg total) by mouth every 6 (six) hours as needed for mild pain (or Fever >/= 101). 04/10/21   Bonnielee Haff, MD  albuterol (VENTOLIN HFA) 108 (90 Base) MCG/ACT inhaler Inhale 1 puff into the lungs every 6 (six) hours as needed for shortness of breath.    [provider]  aspirin EC 81 MG tablet Take 81 mg by mouth daily. Patient not taking: Reported on 04/06/2021    [provider]  cloNIDine (CATAPRES) 0.1 MG tablet Take 1 tablet (0.1 mg total) by mouth daily. Patient not taking: Reported on 04/06/2021 06/24/20   Harvie Heck, MD  diltiazem (CARDIZEM CD) 180 MG 24 hr capsule Take 1 capsule (180 mg total) by mouth daily. 04/11/21   Bonnielee Haff, MD  ferrous sulfate 325 (65 FE) MG tablet Take 1 tablet by mouth. Every Monday, Wednesday, and Friday Patient not taking: Reported on 04/06/2021 07/30/20   [provider]  hydrALAZINE (APRESOLINE) 10 MG tablet Take 1 tablet (10 mg total) by mouth every 8 (eight) hours as needed (for SBP greater than 180). 04/04/21   Little Ishikawa, MD  HYDROcodone-acetaminophen (NORCO/VICODIN) 5-325 MG tablet Take 1-2 tablets by mouth every  4 (four) hours as needed for moderate pain or severe pain. 04/10/21   Bonnielee Haff, MD  Ipratropium-Albuterol (COMBIVENT) 20-100 MCG/ACT AERS respimat Inhale 1 puff into the lungs 4 (four) times daily.    [provider]  lanthanum (FOSRENOL) 1000 MG chewable tablet Chew 1 tablet (1,000 mg total) by mouth 3 (three) times daily with meals. 04/04/21   Little Ishikawa, MD  levothyroxine (SYNTHROID) 50 MCG tablet Take 50 mcg by mouth daily before breakfast. Patient not taking: Reported on 04/06/2021    [provider]  Multiple Vitamin (MULTIVITAMIN) tablet Take 1 tablet by mouth daily. Patient not taking: Reported on 04/06/2021    [provider]  neomycin-bacitracin-polymyxin (NEOSPORIN) OINT Apply 1 application topically 3 (three) times daily. To right antecubital area 04/10/21   Bonnielee Haff, MD  ondansetron (ZOFRAN) 4 MG tablet Take 1 tablet (4 mg total) by mouth daily as needed for nausea or vomiting. 04/11/21 04/11/22  Bonnielee Haff, MD  pantoprazole (PROTONIX) 40 MG tablet Take 1 tablet (40 mg total) by mouth 2 (two) times daily. 04/10/21   Bonnielee Haff, MD  polyethylene glycol (MIRALAX / GLYCOLAX) 17 g packet Take 17 g by mouth daily as needed for mild constipation or moderate constipation. Patient not taking: Reported on 04/06/2021 08/15/20   [name], MD  pravastatin (PRAVACHOL) 40 MG tablet Take 40 mg by mouth at bedtime.    [provider]  tamsulosin [facility])  0.4 MG CAPS capsule Take 1 capsule (0.4 mg total) by mouth daily. 04/11/21   Bonnielee Haff, MD      Allergies    Bee venom and Influenza vaccines    Review of Systems   Review of Systems  All other systems reviewed and are negative.  Physical Exam Updated Vital Signs BP (!) 148/53 (BP Location: Left Arm)    Pulse (!) 104    Temp 98.3 F (36.8 C) (Oral)    Resp (!) 22    SpO2 97%  Physical Exam Vitals and nursing note reviewed.  Constitutional:      General: He is not in  acute distress.    Appearance: Normal appearance. He is well-developed. He is not toxic-appearing.  HENT:     Head: Normocephalic and atraumatic.  Eyes:     General: Lids are normal.     Conjunctiva/sclera: Conjunctivae normal.     Pupils: Pupils are equal, round, and reactive to light.  Neck:     Thyroid: No thyroid mass.     Trachea: No tracheal deviation.  Cardiovascular:     Rate and Rhythm: Normal rate and regular rhythm.     Heart sounds: Normal heart sounds. No murmur heard.   No gallop.  Pulmonary:     Effort: Pulmonary effort is normal. No respiratory distress.     Breath sounds: Normal breath sounds. No stridor. No decreased breath sounds, wheezing, rhonchi or rales.  Abdominal:     General: There is no distension.     Palpations: Abdomen is soft.     Tenderness: There is no abdominal tenderness. There is no rebound.  Musculoskeletal:        General: No tenderness. Normal range of motion.       Arms:     Cervical back: Normal range of motion and neck supple.  Skin:    General: Skin is warm and dry.     Findings: No abrasion or rash.  Neurological:     Mental Status: He is alert and oriented to person, place, and time. Mental status is at baseline.     GCS: GCS eye subscore is 4. GCS verbal subscore is 5. GCS motor subscore is 6.     Cranial Nerves: No cranial nerve deficit.     Sensory: No sensory deficit.     Motor: Motor function is intact.  Psychiatric:        Attention and Perception: Attention normal.        Speech: Speech normal.        Behavior: Behavior normal.    ED Results / Procedures / Treatments   Labs (all labs ordered are listed, but only abnormal results are displayed) Labs Reviewed  LACTIC ACID, PLASMA - Abnormal; Notable for the following components:      Result Value   Lactic Acid, Venous 2.0 (*)    All other components within normal limits  COMPREHENSIVE METABOLIC PANEL - Abnormal; Notable for the following components:   Potassium 2.9  (*)    Chloride 97 (*)    Glucose, Bld 116 (*)    Creatinine, Ser 3.28 (*)    Calcium 8.2 (*)    Total Protein 6.0 (*)    Albumin 2.2 (*)    GFR, Estimated 18 (*)    All other components within normal limits  CBC WITH DIFFERENTIAL/PLATELET - Abnormal; Notable for the following components:   WBC 12.4 (*)    RBC 2.72 (*)    Hemoglobin 7.9 (*)  HCT 25.2 (*)    RDW 16.2 (*)    Neutro Abs 9.3 (*)    Monocytes Absolute 1.1 (*)    All other components within normal limits  RESP PANEL BY RT-PCR (FLU A&B, COVID) ARPGX2  CULTURE, BLOOD (ROUTINE X 2)  CULTURE, BLOOD (ROUTINE X 2)  URINE CULTURE  PROTIME-INR  APTT  LACTIC ACID, PLASMA  URINALYSIS, ROUTINE W REFLEX MICROSCOPIC    EKG None  Radiology DG Chest 2 View  Result Date: 04/18/2021 CLINICAL DATA:  Questionable sepsis EXAM: CHEST - 2 VIEW COMPARISON:  04/06/2021 FINDINGS: Right dialysis catheter remains in place, unchanged. Small bilateral pleural effusions. Bibasilar atelectasis or infiltrates. Findings similar to prior study. Heart is normal size. No acute bony abnormality. IMPRESSION: Small bilateral pleural effusions with bibasilar atelectasis or infiltrates. Electronically Signed   By: Rolm Baptise M.D.   On: 04/18/2021 19:28    Procedures Procedures    Medications Ordered in ED Medications - No data to display  ED Course/ Medical Decision Making/ A&P                           Medical Decision Making  Patient with elevated lactate here.  Blood cultures ordered.  Concern for possible bacteremia.  Chest x-ray without evidence of pneumonia at this time.  Mild leukocytosis on the CBC.  Will initiate antibiotic coverage.  Discussed with vascular surgery who will come and see the patient.  Will admit to the hospital service        Final Clinical Impression(s) / ED Diagnoses Final diagnoses:  Sepsis [facility])    Rx / DC Orders ED Discharge Orders     None         Lacretia Leigh, MD 04/18/21 2030

## 2021-04-18 NOTE — Progress Notes (Addendum)
Pharmacy Antibiotic Note  Wayne Meadows is a 87 y.o. male admitted on 04/18/2021 with bacteremia.  Pharmacy has been consulted for vancomycin dosing. Patient with a history notable for ESRD on HD. Patient presenting with right forearm warm to touch and swollen from HD center. Per HD center tolerated ~2.5 hr session today.  Patient started on vanco/ceftaz at HD center today for concern for line infection.  WBC 12.4; LA 2; T 97.8 F  Plan: Cefepime 1g q24hr on 2/25-- already received ceftazidime - no further dosing needed today Per [facility], patient given dose of vancomycin (2g) and ceftazidime (2g IV) today during HD session. Would plan for Vanco 1g doses per HD while inpatient if continued Subsequent dosing per HD schedule - no dose needed tonight Trend WBC, Fever, Renal function, & Clinical course F/u cultures, clinical course, WBC, fever De-escalate when able F/u Nephrology plan     Temp (24hrs), Avg:98.3 F (36.8 C), Min:98.3 F (36.8 C), Max:98.3 F (36.8 C)  Recent Labs  Lab 04/18/21 1820  WBC 12.4*  CREATININE 3.28*  LATICACIDVEN 2.0*    Estimated Creatinine Clearance: 19.5 mL/min (A) (by C-G formula based on SCr of 3.28 mg/dL (H)).    Allergies  Allergen Reactions   Bee Venom Anaphylaxis    Has epi pen   Influenza Vaccines Other (See Comments)    "Mortally sick for 2 weeks"    Antimicrobials this admission: cefepime 2/24 >>  vancomycin 2/24 >>  Microbiology results: Pending  Thank you for allowing pharmacy to be a part of this patients care.  Lorelei Pont, PharmD, BCPS 04/18/2021 8:36 PM ED Clinical Pharmacist -  [phone]

## 2021-04-18 NOTE — H&P (Signed)
History and Physical   Wayne Meadows DJM:[id number] DOB: 03/16/1934 DOA: 04/18/2021  PCP: [name]   Patient coming from: Home  Chief Complaint: Fistula infection   HPI: Wayne Meadows is a 87 y.o. male with medical history significant of colitis, renal cell cancer, recurrent cellulitis, paroxysmal A-fib, hyperlipidemia, hypertension, CAD, hypothyroidism, ESRD on HD, gastric ulcer, GI bleed, COPD, anxiety, CVA presenting with persistent arm/fistula infection.  Patient reportedly had fistula placed 2 weeks ago and has been on IV vancomycin and ceftazidime outpatient.  Wound has failed to improve despite these IV antibiotics.  Had HD today and he had continued issues with his fistula and infection and sent to the ED for further evaluation.  Denies fevers, chills, chest pain, shortness of breath, abdominal pain, constipation, diarrhea, nausea, vomiting.  ED Course: Vital signs in the ED significant for blood pressure in the 222L systolic, pulse in the 79G to 100s, respiratory rate in the teens to 20s.  Lab work-up included BMP which showed a potassium of 2.9, chloride 97, creatinine 3.28 which is stable and ESRD, glucose 116, calcium 8.2, protein 6, albumin 2.2.  CBC with leukocytosis to 12.4, hemoglobin stable at 7.9.  Lactic acid mildly elevated at 2.  Respiratory panel from flu COVID pending.  Urinalysis, urine culture, blood cultures pending.  Chest x-ray showed small bilateral effusions with basilar atelectasis versus infiltrate.  Patient started on cefepime in the ED.  Vascular surgery was consulted and will see the patient plan for possible surgery while admitted.  Review of Systems: As per HPI otherwise all other systems reviewed and are negative.  Past Medical History:  Diagnosis Date   Abnormal echocardiogram 03/08/2021   Abnormal finding on GI tract imaging    Acute blood loss anemia    Acute pancreatitis 08/12/2020   Arthritis    Carotid stenosis    Community  acquired pneumonia 04/06/2021   COPD (chronic obstructive pulmonary disease) (HCC)    Coronary artery disease    S/p PCI 2011;  NSTEMI 12/12:  LHC/PCI 02/23/11: LAD 60% after the septal perforator, D1 occluded with distal collaterals, proximal RI 30-40%, AV circumflex stent patent with 60% stenosis after the stent, RCA 99%, EF 60-65%.  His RCA was treated with a bare-metal stent   CVA (cerebral infarction) 2011   Right cerebral; total obstruction of the right ICA   Diverticulitis    Hypertension    Paroxysmal atrial fibrillation with RVR ([place]) 04/09/2021   Pleuritic chest pain 04/08/2014   Pressure injury of skin 03/30/2021   Rash 03/05/2021   Rectal bleeding 07/2015   Refractory nausea and vomiting 06/20/2020   Renal carcinoma ([place])    Sepsis, unspecified organism ([place]) 04/11/2016   Stroke ([place])    Tobacco abuse, in remission     Past Surgical History:  Procedure Laterality Date   AV FISTULA PLACEMENT Right 04/03/2021   Procedure: ARTERIOVENOUS (AV) CREATION OF RIGHT ARM BRACHIOCEPHALIC FISTULA;  Surgeon: Cherre Robins, MD;  Location: [place];  Service: Vascular;  Laterality: Right;   BIOPSY  04/08/2021   Procedure: BIOPSY;  Surgeon: Lavena Bullion, DO;  Location: [place];  Service: Gastroenterology;;   COLON SURGERY     COLONOSCOPY N/A 08/22/2015   Procedure: COLONOSCOPY;  Surgeon: [name], MD;  Location: [facility];  Service: Endoscopy;  Laterality: N/A;   ESOPHAGOGASTRODUODENOSCOPY N/A 07/18/2020   Procedure: ESOPHAGOGASTRODUODENOSCOPY (EGD);  Surgeon: Milus Banister, MD;  Location: [facility];  Service: Endoscopy;  Laterality: N/A;  ESOPHAGOGASTRODUODENOSCOPY (EGD) WITH PROPOFOL N/A 06/21/2020   Procedure: ESOPHAGOGASTRODUODENOSCOPY (EGD) WITH PROPOFOL;  Surgeon: Irene Shipper, MD;  Location: [facility];  Service: Endoscopy;  Laterality: N/A;   ESOPHAGOGASTRODUODENOSCOPY (EGD) WITH PROPOFOL N/A 04/08/2021   Procedure: ESOPHAGOGASTRODUODENOSCOPY (EGD) WITH  PROPOFOL;  Surgeon: Lavena Bullion, DO;  Location: [place];  Service: Gastroenterology;  Laterality: N/A;   EUS N/A 07/18/2020   Procedure: UPPER ENDOSCOPIC ULTRASOUND (EUS) RADIAL;  Surgeon: Milus Banister, MD;  Location: [facility];  Service: Endoscopy;  Laterality: N/A;   FINE NEEDLE ASPIRATION N/A 07/18/2020   Procedure: FINE NEEDLE ASPIRATION (FNA) LINEAR;  Surgeon: Milus Banister, MD;  Location: [facility];  Service: Endoscopy;  Laterality: N/A;   HEMOSTASIS CLIP PLACEMENT  04/08/2021   Procedure: HEMOSTASIS CLIP PLACEMENT;  Surgeon: Lavena Bullion, DO;  Location: [place];  Service: Gastroenterology;;   hip relacement     IR FLUORO GUIDE CV LINE RIGHT  03/31/2021   IR US GUIDE VASC ACCESS RIGHT  03/31/2021   KIDNEY SURGERY     LEFT HEART CATH AND CORONARY ANGIOGRAPHY N/A 07/09/2020   Procedure: LEFT HEART CATH AND CORONARY ANGIOGRAPHY;  Surgeon: Leonie Man, MD;  Location: [place] CV [facility];  Service: Cardiovascular;  Laterality: N/A;   LEFT HEART CATHETERIZATION WITH CORONARY ANGIOGRAM N/A 02/23/2011   Procedure: LEFT HEART CATHETERIZATION WITH CORONARY ANGIOGRAM;  Surgeon: [name], MD;  Location: [facility] CATH LAB;  Service: Cardiovascular;  Laterality: N/A;   LEFT HEART CATHETERIZATION WITH CORONARY ANGIOGRAM N/A 03/18/2011   Procedure: LEFT HEART CATHETERIZATION WITH CORONARY ANGIOGRAM;  Surgeon: [name], MD;  Location: [facility] CATH LAB;  Service: Cardiovascular;  Laterality: N/A;   PERCUTANEOUS CORONARY STENT INTERVENTION (PCI-S) N/A 02/23/2011   Procedure: PERCUTANEOUS CORONARY STENT INTERVENTION (PCI-S);  Surgeon: [name], MD;  Location: [facility] CATH LAB;  Service: Cardiovascular;  Laterality: N/A;   TEMPORARY PACEMAKER INSERTION N/A 02/23/2011   Procedure: TEMPORARY PACEMAKER INSERTION;  Surgeon: [name], MD;  Location: [facility] CATH LAB;  Service: Cardiovascular;  Laterality: N/A;   THROMBECTOMY W/ EMBOLECTOMY Right 04/03/2021   Procedure: THROMBECTOMY  ARTERIOVENOUS FISTULA;  Surgeon: Cherre Robins, MD;  Location: [facility] OR;  Service: Vascular;  Laterality: Right;    Social History  reports that he quit smoking about 14 years ago. His smoking use included cigarettes. He has never used smokeless tobacco. He reports that he does not currently use alcohol after a past usage of about 1.0 standard drink per week. He reports that he does not use drugs.  Allergies  Allergen Reactions   Bee Venom Anaphylaxis    Has epi pen   Influenza Vaccines Other (See Comments)    "Mortally sick for 2 weeks"    Family History  Problem Relation Age of Onset   Heart attack Other 53  Reviewed on admission  Prior to Admission medications   Medication Sig Start Date End Date Taking? Authorizing Provider  acetaminophen (TYLENOL) 325 MG tablet Take 2 tablets (650 mg total) by mouth every 6 (six) hours as needed for mild pain (or Fever >/= 101). 04/10/21   Bonnielee Haff, MD  albuterol (VENTOLIN HFA) 108 (90 Base) MCG/ACT inhaler Inhale 1 puff into the lungs every 6 (six) hours as needed for shortness of breath.    [provider]  aspirin EC 81 MG tablet Take 81 mg by mouth daily. Patient not taking: Reported on 04/06/2021    [provider]  cloNIDine (CATAPRES) 0.1 MG tablet Take 1  tablet (0.1 mg total) by mouth daily. Patient not taking: Reported on 04/06/2021 06/24/20   Harvie Heck, MD  diltiazem (CARDIZEM CD) 180 MG 24 hr capsule Take 1 capsule (180 mg total) by mouth daily. 04/11/21   Bonnielee Haff, MD  ferrous sulfate 325 (65 FE) MG tablet Take 1 tablet by mouth. Every Monday, Wednesday, and Friday Patient not taking: Reported on 04/06/2021 07/30/20   [provider]  hydrALAZINE (APRESOLINE) 10 MG tablet Take 1 tablet (10 mg total) by mouth every 8 (eight) hours as needed (for SBP greater than 180). 04/04/21   Little Ishikawa, MD  HYDROcodone-acetaminophen (NORCO/VICODIN) 5-325 MG tablet Take 1-2 tablets by mouth every 4 (four)  hours as needed for moderate pain or severe pain. 04/10/21   Bonnielee Haff, MD  Ipratropium-Albuterol (COMBIVENT) 20-100 MCG/ACT AERS respimat Inhale 1 puff into the lungs 4 (four) times daily.    [provider]  lanthanum (FOSRENOL) 1000 MG chewable tablet Chew 1 tablet (1,000 mg total) by mouth 3 (three) times daily with meals. 04/04/21   Little Ishikawa, MD  levothyroxine (SYNTHROID) 50 MCG tablet Take 50 mcg by mouth daily before breakfast. Patient not taking: Reported on 04/06/2021    [provider]  Multiple Vitamin (MULTIVITAMIN) tablet Take 1 tablet by mouth daily. Patient not taking: Reported on 04/06/2021    [provider]  neomycin-bacitracin-polymyxin (NEOSPORIN) OINT Apply 1 application topically 3 (three) times daily. To right antecubital area 04/10/21   Bonnielee Haff, MD  ondansetron (ZOFRAN) 4 MG tablet Take 1 tablet (4 mg total) by mouth daily as needed for nausea or vomiting. 04/11/21 04/11/22  Bonnielee Haff, MD  pantoprazole (PROTONIX) 40 MG tablet Take 1 tablet (40 mg total) by mouth 2 (two) times daily. 04/10/21   Bonnielee Haff, MD  polyethylene glycol (MIRALAX / GLYCOLAX) 17 g packet Take 17 g by mouth daily as needed for mild constipation or moderate constipation. Patient not taking: Reported on 04/06/2021 08/15/20   [name], MD  pravastatin (PRAVACHOL) 40 MG tablet Take 40 mg by mouth at bedtime.    [provider]  tamsulosin (FLOMAX) 0.4 MG CAPS capsule Take 1 capsule (0.4 mg total) by mouth daily. 04/11/21   Bonnielee Haff, MD    Physical Exam: Vitals:   04/18/21 1801 04/18/21 2040 04/18/21 2042  BP: (!) 148/53 (!) 144/60   Pulse: (!) 104 99   Resp: (!) 22 16   Temp: 98.3 F (36.8 C)  97.8 F (36.6 C)  TempSrc: Oral  Oral  SpO2: 97% 99%     Physical Exam Constitutional:      General: He is not in acute distress.    Appearance: Normal appearance.  HENT:     Head: Normocephalic and atraumatic.      Mouth/Throat:     Mouth: Mucous membranes are moist.     Pharynx: Oropharynx is clear.  Eyes:     Extraocular Movements: Extraocular movements intact.     Pupils: Pupils are equal, round, and reactive to light.  Cardiovascular:     Rate and Rhythm: Normal rate and regular rhythm.     Pulses: Normal pulses.     Heart sounds: Normal heart sounds.  Pulmonary:     Effort: Pulmonary effort is normal. No respiratory distress.     Breath sounds: Normal breath sounds.  Abdominal:     General: Bowel sounds are normal. There is no distension.     Palpations: Abdomen is soft.     Tenderness:  There is no abdominal tenderness.  Musculoskeletal:        General: No swelling or deformity.     Comments: Right arm fistula site bandaged.  Some erythema is visible beyond the bandage  Skin:    General: Skin is warm and dry.  Neurological:     General: No focal deficit present.     Mental Status: Mental status is at baseline.   Labs on Admission: I have personally reviewed following labs and imaging studies  CBC: Recent Labs  Lab 04/18/21 1820  WBC 12.4*  NEUTROABS 9.3*  HGB 7.9*  HCT 25.2*  MCV 92.6  PLT 053    Basic Metabolic Panel: Recent Labs  Lab 04/18/21 1820  NA 137  K 2.9*  CL 97*  CO2 30  GLUCOSE 116*  BUN 11  CREATININE 3.28*  CALCIUM 8.2*    GFR: Estimated Creatinine Clearance: 19.5 mL/min (A) (by C-G formula based on SCr of 3.28 mg/dL (H)).  Liver Function Tests: Recent Labs  Lab 04/18/21 1820  AST 18  ALT 7  ALKPHOS 84  BILITOT 0.3  PROT 6.0*  ALBUMIN 2.2*    Urine analysis:    Component Value Date/Time   COLORURINE YELLOW 03/19/2021 1820   APPEARANCEUR HAZY (A) 03/19/2021 1820   LABSPEC 1.015 03/19/2021 1820   PHURINE 5.0 03/19/2021 1820   GLUCOSEU NEGATIVE 03/19/2021 1820   HGBUR MODERATE (A) 03/19/2021 1820   BILIRUBINUR NEGATIVE 03/19/2021 1820   KETONESUR NEGATIVE 03/19/2021 1820   PROTEINUR >=300 (A) 03/19/2021 1820   NITRITE NEGATIVE  03/19/2021 1820   LEUKOCYTESUR NEGATIVE 03/19/2021 1820    Radiological Exams on Admission: DG Chest 2 View  Result Date: 04/18/2021 CLINICAL DATA:  Questionable sepsis EXAM: CHEST - 2 VIEW COMPARISON:  04/06/2021 FINDINGS: Right dialysis catheter remains in place, unchanged. Small bilateral pleural effusions. Bibasilar atelectasis or infiltrates. Findings similar to prior study. Heart is normal size. No acute bony abnormality. IMPRESSION: Small bilateral pleural effusions with bibasilar atelectasis or infiltrates. Electronically Signed   By: Rolm Baptise M.D.   On: 04/18/2021 19:28    EKG: ordered in the ED, but not yet performed  Assessment/Plan Principal Problem:   Arteriovenous fistula infection ([place]) Active Problems:   History of CVA (cerebrovascular accident)   COPD (chronic obstructive pulmonary disease) ([place])   Other hyperlipidemia   Atrial fibrillation (HCC)   Essential hypertension   Chronic pancreatitis (HCC)   Hypothyroidism   ESRD (end stage renal disease) on dialysis (HCC)   AV fistula infection > Patient presenting with persistent infection/worsening of the skin and the AV fistula site that was placed 2 weeks ago. > Received vancomycin and ceftazidime at HD center per report. > Noted to have continued issues at HD today and sent to the ED for further evaluation. > Borderline tachycardia in the ED with leukocytosis 12.4 and borderline lactic acid of 2. > Cefepime ordered in the ED > Vascular surgery consulted and are seeing the patient with plan for possible surgery - Appreciate vascular surgery recommendations - Monitor on telemetry - Continue with cefepime, add vancomycin - Trend fever curve and white count - Trend lactic acid - Follow-up urinalysis, urine culture, blood culture  ESRD Hypokalemia > On HD Monday Wednesday Friday, Last session today. > Concern for fistula infection as above, seen at dialysis earlier today.  Stable lites other than potassium  3.9. - Will need nephrology consult while here - 20 milliequivalents p.o. potassium, Check magnesium - Avoid nephrotoxic agents - Trend renal function  electrolytes - Continue home lisinopril  Hypertension - Continue home diltiazem - Will need to confirm Clonidine, hydralazine  Hyperlipidemia CAD Hx CVA - Continue home pravastatin, aspirin  COPD - Continue home Combivent and as needed albuterol  Chronic pancreatitis - Stable, continue to monitor  DVT prophylaxis: SCDs Code Status:   Full Family Communication:  Attempted to update patient's daughter, [name], by phone however there is no answer   Disposition Plan:   Patient is from:  Home  Anticipated DC to:  Home  Anticipated DC date:  1 to 3 days  Anticipated DC barriers: None  Consults called:  Vascular surgery, consulted by EDP Admission status:  Observation, telemetry  Severity of Illness: The appropriate patient status for this patient is OBSERVATION. Observation status is judged to be reasonable and necessary in order to provide the required intensity of service to ensure the patient's safety. The patient's presenting symptoms, physical exam findings, and initial radiographic and laboratory data in the context of their medical condition is felt to place them at decreased risk for further clinical deterioration. Furthermore, it is anticipated that the patient will be medically stable for discharge from the hospital within 2 midnights of admission.    Marcelyn Bruins MD [facility]  How to contact the [facility] Attending or Consulting provider [place] or covering provider during after hours [place], for this patient?   Check the care team in [facility] and look for a) attending/consulting [facility] provider listed and b) the [facility] team listed Log into [email or web address] and use [place]'s universal password to access. If you do not have the password, please contact the hospital operator. Locate the [facility] provider you are looking for  under [facility] and page to a number that you can be directly reached. If you still have difficulty reaching the provider, please page the [facility] (Director on Call) for the Hospitalists listed on amion for assistance.  04/18/2021, 9:44 PM

## 2021-04-18 NOTE — ED Notes (Signed)
MD Trilby Drummer notified of only one IV able to be placed

## 2021-04-18 NOTE — ED Notes (Signed)
Admitting MD at bedside.

## 2021-04-19 ENCOUNTER — Other Ambulatory Visit: Payer: Self-pay

## 2021-04-19 ENCOUNTER — Encounter (HOSPITAL_COMMUNITY): Payer: Self-pay | Admitting: Internal Medicine

## 2021-04-19 DIAGNOSIS — G8929 Other chronic pain: Secondary | ICD-10-CM | POA: Diagnosis present

## 2021-04-19 DIAGNOSIS — I129 Hypertensive chronic kidney disease with stage 1 through stage 4 chronic kidney disease, or unspecified chronic kidney disease: Secondary | ICD-10-CM | POA: Diagnosis not present

## 2021-04-19 DIAGNOSIS — N390 Urinary tract infection, site not specified: Secondary | ICD-10-CM | POA: Diagnosis present

## 2021-04-19 DIAGNOSIS — I4891 Unspecified atrial fibrillation: Secondary | ICD-10-CM | POA: Diagnosis not present

## 2021-04-19 DIAGNOSIS — T827XXD Infection and inflammatory reaction due to other cardiac and vascular devices, implants and grafts, subsequent encounter: Secondary | ICD-10-CM | POA: Diagnosis not present

## 2021-04-19 DIAGNOSIS — J439 Emphysema, unspecified: Secondary | ICD-10-CM | POA: Diagnosis not present

## 2021-04-19 DIAGNOSIS — T8131XA Disruption of external operation (surgical) wound, not elsewhere classified, initial encounter: Secondary | ICD-10-CM

## 2021-04-19 DIAGNOSIS — E039 Hypothyroidism, unspecified: Secondary | ICD-10-CM | POA: Diagnosis not present

## 2021-04-19 DIAGNOSIS — M1990 Unspecified osteoarthritis, unspecified site: Secondary | ICD-10-CM | POA: Diagnosis present

## 2021-04-19 DIAGNOSIS — N2581 Secondary hyperparathyroidism of renal origin: Secondary | ICD-10-CM | POA: Diagnosis present

## 2021-04-19 DIAGNOSIS — E1122 Type 2 diabetes mellitus with diabetic chronic kidney disease: Secondary | ICD-10-CM | POA: Diagnosis not present

## 2021-04-19 DIAGNOSIS — F419 Anxiety disorder, unspecified: Secondary | ICD-10-CM | POA: Diagnosis present

## 2021-04-19 DIAGNOSIS — R262 Difficulty in walking, not elsewhere classified: Secondary | ICD-10-CM | POA: Diagnosis not present

## 2021-04-19 DIAGNOSIS — M6281 Muscle weakness (generalized): Secondary | ICD-10-CM | POA: Diagnosis not present

## 2021-04-19 DIAGNOSIS — N186 End stage renal disease: Secondary | ICD-10-CM | POA: Diagnosis not present

## 2021-04-19 DIAGNOSIS — R339 Retention of urine, unspecified: Secondary | ICD-10-CM | POA: Diagnosis present

## 2021-04-19 DIAGNOSIS — D631 Anemia in chronic kidney disease: Secondary | ICD-10-CM | POA: Diagnosis not present

## 2021-04-19 DIAGNOSIS — I739 Peripheral vascular disease, unspecified: Secondary | ICD-10-CM | POA: Diagnosis not present

## 2021-04-19 DIAGNOSIS — T827XXA Infection and inflammatory reaction due to other cardiac and vascular devices, implants and grafts, initial encounter: Secondary | ICD-10-CM | POA: Diagnosis not present

## 2021-04-19 DIAGNOSIS — I6529 Occlusion and stenosis of unspecified carotid artery: Secondary | ICD-10-CM | POA: Diagnosis present

## 2021-04-19 DIAGNOSIS — T827XXS Infection and inflammatory reaction due to other cardiac and vascular devices, implants and grafts, sequela: Secondary | ICD-10-CM | POA: Diagnosis not present

## 2021-04-19 DIAGNOSIS — K861 Other chronic pancreatitis: Secondary | ICD-10-CM | POA: Diagnosis not present

## 2021-04-19 DIAGNOSIS — Z992 Dependence on renal dialysis: Secondary | ICD-10-CM | POA: Diagnosis not present

## 2021-04-19 DIAGNOSIS — E876 Hypokalemia: Secondary | ICD-10-CM | POA: Diagnosis present

## 2021-04-19 DIAGNOSIS — Z20822 Contact with and (suspected) exposure to covid-19: Secondary | ICD-10-CM | POA: Diagnosis present

## 2021-04-19 DIAGNOSIS — A419 Sepsis, unspecified organism: Secondary | ICD-10-CM | POA: Diagnosis present

## 2021-04-19 DIAGNOSIS — R278 Other lack of coordination: Secondary | ICD-10-CM | POA: Diagnosis not present

## 2021-04-19 DIAGNOSIS — Z8673 Personal history of transient ischemic attack (TIA), and cerebral infarction without residual deficits: Secondary | ICD-10-CM | POA: Diagnosis not present

## 2021-04-19 DIAGNOSIS — I10 Essential (primary) hypertension: Secondary | ICD-10-CM | POA: Diagnosis not present

## 2021-04-19 DIAGNOSIS — I480 Paroxysmal atrial fibrillation: Secondary | ICD-10-CM | POA: Diagnosis present

## 2021-04-19 DIAGNOSIS — Y832 Surgical operation with anastomosis, bypass or graft as the cause of abnormal reaction of the patient, or of later complication, without mention of misadventure at the time of the procedure: Secondary | ICD-10-CM | POA: Diagnosis present

## 2021-04-19 DIAGNOSIS — I1311 Hypertensive heart and chronic kidney disease without heart failure, with stage 5 chronic kidney disease, or end stage renal disease: Secondary | ICD-10-CM | POA: Diagnosis present

## 2021-04-19 DIAGNOSIS — R2681 Unsteadiness on feet: Secondary | ICD-10-CM | POA: Diagnosis not present

## 2021-04-19 DIAGNOSIS — R41841 Cognitive communication deficit: Secondary | ICD-10-CM | POA: Diagnosis not present

## 2021-04-19 DIAGNOSIS — I679 Cerebrovascular disease, unspecified: Secondary | ICD-10-CM | POA: Diagnosis not present

## 2021-04-19 DIAGNOSIS — T8130XA Disruption of wound, unspecified, initial encounter: Secondary | ICD-10-CM | POA: Diagnosis not present

## 2021-04-19 DIAGNOSIS — J449 Chronic obstructive pulmonary disease, unspecified: Secondary | ICD-10-CM | POA: Diagnosis not present

## 2021-04-19 DIAGNOSIS — B965 Pseudomonas (aeruginosa) (mallei) (pseudomallei) as the cause of diseases classified elsewhere: Secondary | ICD-10-CM | POA: Diagnosis present

## 2021-04-19 DIAGNOSIS — E7849 Other hyperlipidemia: Secondary | ICD-10-CM | POA: Diagnosis not present

## 2021-04-19 DIAGNOSIS — N139 Obstructive and reflux uropathy, unspecified: Secondary | ICD-10-CM | POA: Diagnosis not present

## 2021-04-19 DIAGNOSIS — D509 Iron deficiency anemia, unspecified: Secondary | ICD-10-CM | POA: Diagnosis present

## 2021-04-19 LAB — COMPREHENSIVE METABOLIC PANEL
ALT: 6 U/L (ref 0–44)
AST: 13 U/L — ABNORMAL LOW (ref 15–41)
Albumin: 1.8 g/dL — ABNORMAL LOW (ref 3.5–5.0)
Alkaline Phosphatase: 65 U/L (ref 38–126)
Anion gap: 11 (ref 5–15)
BUN: 15 mg/dL (ref 8–23)
CO2: 27 mmol/L (ref 22–32)
Calcium: 7.8 mg/dL — ABNORMAL LOW (ref 8.9–10.3)
Chloride: 100 mmol/L (ref 98–111)
Creatinine, Ser: 4.04 mg/dL — ABNORMAL HIGH (ref 0.61–1.24)
GFR, Estimated: 14 mL/min — ABNORMAL LOW (ref >60)
Glucose, Bld: 91 mg/dL (ref 70–99)
Potassium: 3.6 mmol/L (ref 3.5–5.1)
Sodium: 138 mmol/L (ref 135–145)
Total Bilirubin: 0.2 mg/dL — ABNORMAL LOW (ref 0.3–1.2)
Total Protein: 5.1 g/dL — ABNORMAL LOW (ref 6.5–8.1)

## 2021-04-19 LAB — RESP PANEL BY RT-PCR (FLU A&B, COVID) ARPGX2
Influenza A by PCR: NEGATIVE
Influenza B by PCR: NEGATIVE
SARS Coronavirus 2 by RT PCR: NEGATIVE

## 2021-04-19 LAB — CBC
HCT: 20.8 % — ABNORMAL LOW (ref 39.0–52.0)
Hemoglobin: 6.6 g/dL — CL (ref 13.0–17.0)
MCH: 29.3 pg (ref 26.0–34.0)
MCHC: 31.7 g/dL (ref 30.0–36.0)
MCV: 92.4 fL (ref 80.0–100.0)
Platelets: 254 10*3/uL (ref 150–400)
RBC: 2.25 MIL/uL — ABNORMAL LOW (ref 4.22–5.81)
RDW: 16.2 % — ABNORMAL HIGH (ref 11.5–15.5)
WBC: 9.2 10*3/uL (ref 4.0–10.5)
nRBC: 0 % (ref 0.0–0.2)

## 2021-04-19 LAB — HEMOGLOBIN AND HEMATOCRIT, BLOOD
HCT: 21.5 % — ABNORMAL LOW (ref 39.0–52.0)
Hemoglobin: 7 g/dL — ABNORMAL LOW (ref 13.0–17.0)

## 2021-04-19 LAB — PREPARE RBC (CROSSMATCH)

## 2021-04-19 MED ORDER — CHLORHEXIDINE GLUCONATE CLOTH 2 % EX PADS
6.0000 | MEDICATED_PAD | Freq: Every day | CUTANEOUS | Status: DC
  Administered 2021-04-19 – 2021-04-22 (×4): 6 via TOPICAL

## 2021-04-19 MED ORDER — SODIUM CHLORIDE 0.9% IV SOLUTION: Freq: Once | INTRAVENOUS | Status: AC

## 2021-04-19 MED ORDER — VANCOMYCIN HCL IN DEXTROSE 1-5 GM/200ML-% IV SOLN
1000.0000 mg | INTRAVENOUS | Status: DC
  Administered 2021-04-21: 1000 mg via INTRAVENOUS
  Filled 2021-04-19 (×2): qty 200

## 2021-04-19 NOTE — Consult Note (Addendum)
[facility]  Total days of antibiotics 2               Reason for Consult: AV fistula infection    Referring Physician: [place]  Principal Problem:   Arteriovenous fistula infection ([place]) Active Problems:   History of CVA (cerebrovascular accident)   COPD (chronic obstructive pulmonary disease) ([place])   Other hyperlipidemia   Atrial fibrillation (HCC)   Essential hypertension   Chronic pancreatitis (HCC)   Hypothyroidism   ESRD (end stage renal disease) on dialysis [facility])    HPI: Wayne Meadows is a 87 y.o. male withhistory of COPD, HTN, chronic pancreatitis, ESRD on HD, who was admitted on 2/24 for worsening right arm pain and erythema c/w cellulitis. Also complains of shortness of breath. He had right AVF placed on 2/9 but roughly 5 DAYS after placement started to have signs of infection per notes- and has been on vancomycin and ceftaz. Which has not improved. On admit, he had leukocytosis of 12K, LA of 2. He was continuesd on vancomycin ad cefepime. He was seen by vascular who felt he had some dehiscence of his antecubital incision on the medial side of brachiocephalic fistula. No plans for surgical debridement but recommended wound care. Also had asymptomatic anemia, with recent history of GI bleed. Blood cx NGTD but < 24hr. No other recent positive cultures. He is MRSA colonized.  Past Medical History:  Diagnosis Date   Abnormal echocardiogram 03/08/2021   Abnormal finding on GI tract imaging    Acute blood loss anemia    Acute pancreatitis 08/12/2020   Arthritis    Carotid stenosis    Community acquired pneumonia 04/06/2021   COPD (chronic obstructive pulmonary disease) (HCC)    Coronary artery disease    S/p PCI 2011;  NSTEMI 12/12:  LHC/PCI 02/23/11: LAD 60% after the septal perforator, D1 occluded with distal collaterals, proximal RI 30-40%, AV circumflex stent patent with 60% stenosis after the stent, RCA 99%, EF 60-65%.  His RCA was treated  with a bare-metal stent   CVA (cerebral infarction) 2011   Right cerebral; total obstruction of the right ICA   Diverticulitis    Hypertension    Paroxysmal atrial fibrillation with RVR ([place]) 04/09/2021   Pleuritic chest pain 04/08/2014   Pressure injury of skin 03/30/2021   Rash 03/05/2021   Rectal bleeding 07/2015   Refractory nausea and vomiting 06/20/2020   Renal carcinoma (HCC)    Sepsis, unspecified organism ([place]) 04/11/2016   Stroke ([place])    Tobacco abuse, in remission     Allergies:  Allergies  Allergen Reactions   Bee Venom Anaphylaxis    Has epi pen   Influenza Vaccines Other (See Comments)    "Mortally sick for 2 weeks"   MEDICATIONS:  Chlorhexidine Gluconate Cloth  6 each Topical Daily   diltiazem  180 mg Oral Daily   ipratropium-albuterol  3 mL Nebulization QID   lanthanum  1,000 mg Oral TID WC   levothyroxine  50 mcg Oral QAC breakfast   pantoprazole  40 mg Oral BID   pravastatin  40 mg Oral QHS   sodium chloride flush  3 mL Intravenous Q12H    Social History   Tobacco Use   Smoking status: Former    Types: Cigarettes    Quit date: 02/24/2007    Years since quitting: 14.1   Smokeless tobacco: Never  Vaping Use   Vaping Use: Never used  Substance Use Topics  Alcohol use: Not Currently    Alcohol/week: 1.0 standard drink    Types: 1 Standard drinks or equivalent per week    Comment: socially    Drug use: No    Family History  Problem Relation Age of Onset   Heart attack Other 11    Review of Systems -  Constitutional: Negative for fever, chills, diaphoresis, activity change, appetite change, fatigue and unexpected weight change.  HENT: Negative for congestion, sore throat, rhinorrhea, sneezing, trouble swallowing and sinus pressure.  Eyes: Negative for photophobia and visual disturbance.  Respiratory: Negative for cough, chest tightness, shortness of breath, wheezing and stridor.  Cardiovascular: Negative for chest pain, palpitations and leg  swelling.  Gastrointestinal: Negative for nausea, vomiting, abdominal pain, diarrhea, constipation, blood in stool, abdominal distention and anal bleeding.  Genitourinary: Negative for dysuria, hematuria, flank pain and difficulty urinating.  Musculoskeletal: +right arm tenderness.Negative for myalgias, back pain, joint swelling, arthralgias and gait problem.  Skin: Negative for color change, pallor, rash and wound.  Neurological: Negative for dizziness, tremors, weakness and light-headedness.  Hematological: Negative for adenopathy. Does not bruise/bleed easily.  Psychiatric/Behavioral: Negative for behavioral problems, confusion, sleep disturbance, dysphoric mood, decreased concentration and agitation.    OBJECTIVE: Temp:  [97.8 F (36.6 C)-99 F (37.2 C)] 98.8 F (37.1 C) (02/25 1339) Pulse Rate:  [82-104] 82 (02/25 1339) Resp:  [14-25] 15 (02/25 1339) BP: (138-169)/(52-74) 157/54 (02/25 1339) SpO2:  [95 %-100 %] 95 % (02/25 1339) Weight:  [81.7 kg-92.5 kg] 81.7 kg (02/25 0500) Physical Exam  Constitutional: He is oriented to person, place, and time. He appears well-developed and well-nourished. No distress.  HENT:  Mouth/Throat: Oropharynx is clear and moist. No oropharyngeal exudate.  Cardiovascular: Normal rate, regular rhythm and normal heart sounds. Exam reveals no gallop and no friction rub.  No murmur heard.  Pulmonary/Chest: Effort normal and breath sounds normal. No respiratory distress. He has no wheezes.  Abdominal: Soft. Bowel sounds are normal. He exhibits no distension. There is no tenderness.  Ext: serosangious drainage on bandage, pinpoint opening-medial proximal aspect of incision Neurological: He is alert and oriented to person, place, and time.  Skin: Skin is warm and dry. No rash noted. No erythema.  Psychiatric: He has a normal mood and affect. His behavior is normal.    LABS: Results for orders placed or performed during the hospital encounter of 04/18/21  (from the past 48 hour(s))  Resp Panel by RT-PCR (Flu A&B, Covid) Nasopharyngeal Swab     Status: None   Collection Time: 04/18/21  6:08 PM   Specimen: Nasopharyngeal Swab; Nasopharyngeal(NP) swabs in vial transport medium  Result Value Ref Range   SARS Coronavirus 2 by RT PCR NEGATIVE NEGATIVE    Comment: (NOTE) SARS-CoV-2 target nucleic acids are NOT DETECTED.  The SARS-CoV-2 RNA is generally detectable in upper respiratory specimens during the acute phase of infection. The lowest concentration of SARS-CoV-2 viral copies this assay can detect is 138 copies/mL. A negative result does not preclude SARS-Cov-2 infection and should not be used as the sole basis for treatment or other patient management decisions. A negative result may occur with  improper specimen collection/handling, submission of specimen other than nasopharyngeal swab, presence of viral mutation(s) within the areas targeted by this assay, and inadequate number of viral copies(<138 copies/mL). A negative result must be combined with clinical observations, patient history, and epidemiological information. The expected result is Negative.  Fact Sheet for Patients:  EntrepreneurPulse.com.au  Fact Sheet for Healthcare Providers:  IncredibleEmployment.be  This test is no t yet approved or cleared by the [place] and  has been authorized for detection and/or diagnosis of SARS-CoV-2 by FDA under an Emergency Use Authorization (EUA). This EUA will remain  in effect (meaning this test can be used) for the duration of the COVID-19 declaration under Section 564(b)(1) of the Act, 21 U.S.C.section 360bbb-3(b)(1), unless the authorization is terminated  or revoked sooner.       Influenza A by PCR NEGATIVE NEGATIVE   Influenza B by PCR NEGATIVE NEGATIVE    Comment: (NOTE) The Xpert Xpress SARS-CoV-2/FLU/RSV plus assay is intended as an aid in the diagnosis of influenza from  Nasopharyngeal swab specimens and should not be used as a sole basis for treatment. Nasal washings and aspirates are unacceptable for Xpert Xpress SARS-CoV-2/FLU/RSV testing.  Fact Sheet for Patients: EntrepreneurPulse.com.au  Fact Sheet for Healthcare Providers: IncredibleEmployment.be  This test is not yet approved or cleared by the [place] FDA and has been authorized for detection and/or diagnosis of SARS-CoV-2 by FDA under an Emergency Use Authorization (EUA). This EUA will remain in effect (meaning this test can be used) for the duration of the COVID-19 declaration under Section 564(b)(1) of the Act, 21 U.S.C. section 360bbb-3(b)(1), unless the authorization is terminated or revoked.  Performed at [facility], [place] [address], [place], [place] [address]   Lactic acid, plasma     Status: Abnormal   Collection Time: 04/18/21  6:20 PM  Result Value Ref Range   Lactic Acid, Venous 2.0 (HH) 0.5 - 1.9 mmol/L    Comment: CRITICAL RESULT CALLED TO, READ BACK BY AND VERIFIED WITH: K.OWENS,RN 04/18/2021 AT 1917 A.HUGHES Performed at [facility], [place] [address], [place], [place] [address]   Comprehensive metabolic panel     Status: Abnormal   Collection Time: 04/18/21  6:20 PM  Result Value Ref Range   Sodium 137 135 - 145 mmol/L   Potassium 2.9 (L) 3.5 - 5.1 mmol/L   Chloride 97 (L) 98 - 111 mmol/L   CO2 30 22 - 32 mmol/L   Glucose, Bld 116 (H) 70 - 99 mg/dL    Comment: Glucose reference range applies only to samples taken after fasting for at least 8 hours.   BUN 11 8 - 23 mg/dL   Creatinine, Ser 3.28 (H) 0.61 - 1.24 mg/dL   Calcium 8.2 (L) 8.9 - 10.3 mg/dL   Total Protein 6.0 (L) 6.5 - 8.1 g/dL   Albumin 2.2 (L) 3.5 - 5.0 g/dL   AST 18 15 - 41 U/L   ALT 7 0 - 44 U/L   Alkaline Phosphatase 84 38 - 126 U/L   Total Bilirubin 0.3 0.3 - 1.2 mg/dL   GFR, Estimated 18 (L) >60 mL/min    Comment: (NOTE) Calculated using the  CKD-EPI Creatinine Equation (2021)    Anion gap 10 5 - 15    Comment: Performed at [facility], [place] [address] [place], [place] [address]  CBC WITH DIFFERENTIAL     Status: Abnormal   Collection Time: 04/18/21  6:20 PM  Result Value Ref Range   WBC 12.4 (H) 4.0 - 10.5 K/uL   RBC 2.72 (L) 4.22 - 5.81 MIL/uL   Hemoglobin 7.9 (L) 13.0 - 17.0 g/dL   HCT 25.2 (L) 39.0 - 52.0 %   MCV 92.6 80.0 - 100.0 fL   MCH 29.0 26.0 - 34.0 pg   MCHC 31.3 30.0 - 36.0 g/dL  RDW 16.2 (H) 11.5 - 15.5 %   Platelets 350 150 - 400 K/uL   nRBC 0.0 0.0 - 0.2 %   Neutrophils Relative % 74 %   Neutro Abs 9.3 (H) 1.7 - 7.7 K/uL   Lymphocytes Relative 14 %   Lymphs Abs 1.7 0.7 - 4.0 K/uL   Monocytes Relative 9 %   Monocytes Absolute 1.1 (H) 0.1 - 1.0 K/uL   Eosinophils Relative 2 %   Eosinophils Absolute 0.2 0.0 - 0.5 K/uL   Basophils Relative 1 %   Basophils Absolute 0.1 0.0 - 0.1 K/uL   Immature Granulocytes 0 %   Abs Immature Granulocytes 0.04 0.00 - 0.07 K/uL    Comment: Performed at [facility] [address], [place], [place] [address]  Protime-INR     Status: None   Collection Time: 04/18/21  6:20 PM  Result Value Ref Range   Prothrombin Time 14.1 11.4 - 15.2 seconds   INR 1.1 0.8 - 1.2    Comment: (NOTE) INR goal varies based on device and disease states. Performed at [facility], [place] [address], [place], [place] [address]   APTT     Status: None   Collection Time: 04/18/21  6:20 PM  Result Value Ref Range   aPTT 34 24 - 36 seconds    Comment: Performed at [facility] [address] [place], [place] [address]  Blood Culture (routine x 2)     Status: None (Preliminary result)   Collection Time: 04/18/21  6:20 PM   Specimen: BLOOD  Result Value Ref Range   Specimen Description BLOOD SITE NOT SPECIFIED    Special Requests      BOTTLES DRAWN AEROBIC AND ANAEROBIC Blood Culture results may not be optimal due to an excessive volume of blood received in culture  bottles   Culture      NO GROWTH < 24 HOURS Performed at [facility] [address], [place], [place] [address]    Report Status PENDING   Lactic acid, plasma     Status: None   Collection Time: 04/18/21  9:20 PM  Result Value Ref Range   Lactic Acid, Venous 1.2 0.5 - 1.9 mmol/L    Comment: Performed at [facility] [address] [place], [place] [address]  Blood Culture (routine x 2)     Status: None (Preliminary result)   Collection Time: 04/18/21  9:20 PM   Specimen: BLOOD  Result Value Ref Range   Specimen Description BLOOD BLOOD LEFT ARM    Special Requests      BOTTLES DRAWN AEROBIC AND ANAEROBIC Blood Culture adequate volume   Culture      NO GROWTH < 12 HOURS Performed at [facility], [place] [address], [place], [place] [address]    Report Status PENDING   Magnesium     Status: Abnormal   Collection Time: 04/18/21  9:20 PM  Result Value Ref Range   Magnesium 1.5 (L) 1.7 - 2.4 mg/dL    Comment: Performed at [facility] [address], [place], [place] [address]  Urinalysis, Routine w reflex microscopic     Status: Abnormal   Collection Time: 04/18/21 11:16 PM  Result Value Ref Range   Color, Urine AMBER (A) YELLOW   APPearance TURBID (A) CLEAR   Specific Gravity, Urine 1.029 1.005 - 1.030   pH 5.0 5.0 - 8.0   Glucose, UA NEGATIVE NEGATIVE mg/dL  Hgb urine dipstick LARGE (A) NEGATIVE   Bilirubin Urine NEGATIVE NEGATIVE   Ketones, ur NEGATIVE NEGATIVE mg/dL   Protein, ur >=300 (A) NEGATIVE mg/dL   Nitrite NEGATIVE NEGATIVE   Leukocytes,Ua MODERATE (A) NEGATIVE   RBC / HPF >50 (H) 0 - 5 RBC/hpf   WBC, UA >50 (H) 0 - 5 WBC/hpf   Bacteria, UA MANY (A) NONE SEEN   Budding Yeast PRESENT     Comment: Performed at [facility] [address], [place], [place] [address]  CBC     Status: Abnormal   Collection Time: 04/19/21  6:19 AM  Result Value Ref Range   WBC 9.2 4.0 - 10.5 K/uL   RBC 2.25 (L) 4.22 - 5.81 MIL/uL   Hemoglobin 6.6  (LL) 13.0 - 17.0 g/dL    Comment: REPEATED TO VERIFY THIS CRITICAL RESULT HAS VERIFIED AND BEEN CALLED TO RN COURTNEY CLARK BY [employer] ON 02 25 2023 AT 0641, AND HAS BEEN READ BACK.     HCT 20.8 (L) 39.0 - 52.0 %   MCV 92.4 80.0 - 100.0 fL   MCH 29.3 26.0 - 34.0 pg   MCHC 31.7 30.0 - 36.0 g/dL   RDW 16.2 (H) 11.5 - 15.5 %   Platelets 254 150 - 400 K/uL   nRBC 0.0 0.0 - 0.2 %    Comment: Performed at [facility] [address], [place], [place] [address]  Comprehensive metabolic panel     Status: Abnormal   Collection Time: 04/19/21  6:19 AM  Result Value Ref Range   Sodium 138 135 - 145 mmol/L   Potassium 3.6 3.5 - 5.1 mmol/L    Comment: DELTA CHECK NOTED   Chloride 100 98 - 111 mmol/L   CO2 27 22 - 32 mmol/L   Glucose, Bld 91 70 - 99 mg/dL    Comment: Glucose reference range applies only to samples taken after fasting for at least 8 hours.   BUN 15 8 - 23 mg/dL   Creatinine, Ser 4.04 (H) 0.61 - 1.24 mg/dL   Calcium 7.8 (L) 8.9 - 10.3 mg/dL   Total Protein 5.1 (L) 6.5 - 8.1 g/dL   Albumin 1.8 (L) 3.5 - 5.0 g/dL   AST 13 (L) 15 - 41 U/L   ALT 6 0 - 44 U/L   Alkaline Phosphatase 65 38 - 126 U/L   Total Bilirubin 0.2 (L) 0.3 - 1.2 mg/dL   GFR, Estimated 14 (L) >60 mL/min    Comment: (NOTE) Calculated using the CKD-EPI Creatinine Equation (2021)    Anion gap 11 5 - 15    Comment: Performed at [facility], [place] [address], [place], [place] [address]  Hemoglobin and hematocrit, blood     Status: Abnormal   Collection Time: 04/19/21  7:36 AM  Result Value Ref Range   Hemoglobin 7.0 (L) 13.0 - 17.0 g/dL    Comment: REPEATED TO VERIFY   HCT 21.5 (L) 39.0 - 52.0 %    Comment: Performed at [facility] [address], [place], [place] [address]  Type and screen [place]     Status: None (Preliminary result)   Collection Time: 04/19/21  7:43 AM  Result Value Ref Range   ABO/RH(D) O POS    Antibody Screen NEG    Sample Expiration  04/22/2021,2359    Unit Number [id number]    Blood Component Type RED CELLS,LR    Unit division 00  Status of Unit ISSUED    Transfusion Status OK TO TRANSFUSE    Crossmatch Result      Compatible Performed at [facility], [place] [address] [place], [place] [address]   Prepare RBC (crossmatch)     Status: None   Collection Time: 04/19/21 12:07 PM  Result Value Ref Range   Order Confirmation      ORDER PROCESSED BY BLOOD BANK Performed at [facility], [place] [address] [place], [place] [address]     MICRO: reviewed IMAGING: DG Chest 2 View  Result Date: 04/18/2021 CLINICAL DATA:  Questionable sepsis EXAM: CHEST - 2 VIEW COMPARISON:  04/06/2021 FINDINGS: Right dialysis catheter remains in place, unchanged. Small bilateral pleural effusions. Bibasilar atelectasis or infiltrates. Findings similar to prior study. Heart is normal size. No acute bony abnormality. IMPRESSION: Small bilateral pleural effusions with bibasilar atelectasis or infiltrates. Electronically Signed   By: Rolm Baptise M.D.   On: 04/18/2021 19:28    Assessment/Plan:  87yo M with right AVF infection with small incision dehiscence. Who already has been on iv abx for roughly 7-10d still with signs of infection - recommend to continue on vancomycin and cefepime with HD -since wound is still draining, consider getting bedside sample/wound cx - recommend CT imaging on his right upper extremity to see if there is any fluid collection that may need aspiration - continue with local wound care  Michelangelo Rindfleisch B. [facility] Infect[facility] [phone]

## 2021-04-19 NOTE — Progress Notes (Signed)
Patient was admitted on the unit with his belongings. Patient has been oriented to the unit. Patient is NPO and asked for rationale,was thirsty. Per cross cover MD, patient may have wash out procedure today involving AV fistula,later on. Patient was given mouth moistening swabs and was content. Patient stated he understood that he is not to eat or drink for possible procedure today.

## 2021-04-19 NOTE — Progress Notes (Signed)
PROGRESS NOTE    DECKER COGDELL  MVH:[id number] DOB: [birth year] DOA: 04/18/2021 PCP: [name]   Brief Narrative:  COY ROCHFORD is a 87 y.o. male with medical history significant of colitis, renal cell cancer, recurrent cellulitis, paroxysmal A-fib, hyperlipidemia, hypertension, CAD, hypothyroidism, ESRD on HD, gastric ulcer, GI bleed, COPD, anxiety, CVA presenting with persistent arm/fistula infection. Has been on vancomycin and ceftazidime outpatient. Presented for worsening pain and infection at fistula site.  Assessment & Plan:   Principal Problem:   Arteriovenous fistula infection ([place]) Active Problems:   Atrial fibrillation (HCC)   COPD (chronic obstructive pulmonary disease) (HCC)   Essential hypertension   Hypothyroidism   History of CVA (cerebrovascular accident)   Other hyperlipidemia   Chronic pancreatitis (HCC)   ESRD (end stage renal disease) on dialysis ([place])   Sepsis secondary to AV fistula infection, POA > Patient presenting with persistent infection/worsening of the skin and the AV fistula site that was placed 2 weeks ago. > Has been on vancomycin and ceftazidime since discharge earlier this month > Vascular surgery/nephrology consulted - Continue cefepime and vancomycin -Cultures pending, afebrile since admission -IV sidelined for antibiotic recommendations given concern for recurrent/new infection while on antibiotics   Acute symptomatic anemia, unclear source Rule out acute blood loss anemia on chronic anemia of chronic disease -Recently admitted for GI bleed, reports ongoing black stool on occasion -Continue transfusion hemoglobin less than 7 or symptomatic, blood ideally transfused with dialysis to help moderate volume status -1 unit PRBC ordered -follow repeat H&H per protocol -Continue to hold any anticoagulation -discontinue aspirin  ESRD Hypokalemia > On HD Monday Wednesday Friday > Concern for fistula infection as  above -Nephrology following   Hypertension - Continue home diltiazem, lisinopril -currently well controlled  Hyperlipidemia CAD Hx CVA - Continue home pravastatin, aspirin  COPD - Continue home Combivent and as needed albuterol   Chronic pancreatitis - Stable, continue to monitor   DVT prophylaxis:  SCDs Code Status:  Full Family Communication: None available  Status is: Inpatient  Dispo: The patient is from: Facility              Anticipated d/c is to: Same              Anticipated d/c date is: 48 to 72 hours              Patient currently not medically stable for discharge  Consultants:  Nephrology, vascular surgery  Procedures:  None  Antimicrobials:  Vancomycin, cefepime  Subjective: No acute issues or events overnight, continues to feel weak general malaise but denies nausea vomiting diarrhea constipation headache fevers or chills.  Right arm pain ongoing.  Also reports occasional black stool outpatient without any overt bright red blood per rectum.  Objective: Vitals:   04/19/21 0200 04/19/21 0230 04/19/21 0309 04/19/21 0500  BP: (!) 154/57  (!) 150/52   Pulse: 89  (!) 103   Resp: 16  18   Temp:  98.8 F (37.1 C) 98 F (36.7 C)   TempSrc:  Oral Oral   SpO2: 100%  96%   Weight:    81.7 kg  Height:        Intake/Output Summary (Last 24 hours) at 04/19/2021 0734 Last data filed at 04/18/2021 2302 Gross per 24 hour  Intake --  Output 200 ml  Net -200 ml   Filed Weights   04/18/21 2304 04/19/21 0500  Weight: 92.5 kg 81.7 kg    Examination:  General:  Pleasantly resting in bed, No acute distress. HEENT:  Normocephalic atraumatic.  Sclerae nonicteric, noninjected.  Extraocular movements intact bilaterally. Neck:  Without mass or deformity.  Trachea is midline. Lungs:  Clear to auscultate bilaterally without rhonchi, wheeze, or rales. Heart:  Regular rate and rhythm.  Without murmurs, rubs, or gallops. Abdomen:  Soft, nontender, nondistended.   Without guarding or rebound. Extremities: Right upper extremity fistula bandage clean dry intact  Data Reviewed: I have personally reviewed following labs and imaging studies  CBC: Recent Labs  Lab 04/18/21 1820 04/19/21 0619  WBC 12.4* 9.2  NEUTROABS 9.3*  --   HGB 7.9* 6.6*  HCT 25.2* 20.8*  MCV 92.6 92.4  PLT 350 242   Basic Metabolic Panel: Recent Labs  Lab 04/18/21 1820 04/18/21 2120 04/19/21 0619  NA 137  --  138  K 2.9*  --  3.6  CL 97*  --  100  CO2 30  --  27  GLUCOSE 116*  --  91  BUN 11  --  15  CREATININE 3.28*  --  4.04*  CALCIUM 8.2*  --  7.8*  MG  --  1.5*  --    GFR: Estimated Creatinine Clearance: 15.7 mL/min (A) (by C-G formula based on SCr of 4.04 mg/dL (H)). Liver Function Tests: Recent Labs  Lab 04/18/21 1820 04/19/21 0619  AST 18 13*  ALT 7 6  ALKPHOS 84 65  BILITOT 0.3 0.2*  PROT 6.0* 5.1*  ALBUMIN 2.2* 1.8*   No results for input(s): LIPASE, AMYLASE in the last 168 hours. No results for input(s): AMMONIA in the last 168 hours. Coagulation Profile: Recent Labs  Lab 04/18/21 1820  INR 1.1   Cardiac Enzymes: No results for input(s): CKTOTAL, CKMB, CKMBINDEX, TROPONINI in the last 168 hours. BNP (last 3 results) No results for input(s): PROBNP in the last 8760 hours. HbA1C: No results for input(s): HGBA1C in the last 72 hours. CBG: No results for input(s): GLUCAP in the last 168 hours. Lipid Profile: No results for input(s): CHOL, HDL, LDLCALC, TRIG, CHOLHDL, LDLDIRECT in the last 72 hours. Thyroid Function Tests: No results for input(s): TSH, T4TOTAL, FREET4, T3FREE, THYROIDAB in the last 72 hours. Anemia Panel: No results for input(s): VITAMINB12, FOLATE, FERRITIN, TIBC, IRON, RETICCTPCT in the last 72 hours. Sepsis Labs: Recent Labs  Lab 04/18/21 1820 04/18/21 2120  LATICACIDVEN 2.0* 1.2    Recent Results (from the past 240 hour(s))  Resp Panel by RT-PCR (Flu A&B, Covid) Nasopharyngeal Swab     Status: None    Collection Time: 04/18/21  6:08 PM   Specimen: Nasopharyngeal Swab; Nasopharyngeal(NP) swabs in vial transport medium  Result Value Ref Range Status   SARS Coronavirus 2 by RT PCR NEGATIVE NEGATIVE Final    Comment: (NOTE) SARS-CoV-2 target nucleic acids are NOT DETECTED.  The SARS-CoV-2 RNA is generally detectable in upper respiratory specimens during the acute phase of infection. The lowest concentration of SARS-CoV-2 viral copies this assay can detect is 138 copies/mL. A negative result does not preclude SARS-Cov-2 infection and should not be used as the sole basis for treatment or other patient management decisions. A negative result may occur with  improper specimen collection/handling, submission of specimen other than nasopharyngeal swab, presence of viral mutation(s) within the areas targeted by this assay, and inadequate number of viral copies(<138 copies/mL). A negative result must be combined with clinical observations, patient history, and epidemiological information. The expected result is Negative.  Fact Sheet for Patients:  EntrepreneurPulse.com.au  Fact  Sheet for Healthcare Providers:  IncredibleEmployment.be  This test is no t yet approved or cleared by the [place] FDA and  has been authorized for detection and/or diagnosis of SARS-CoV-2 by FDA under an Emergency Use Authorization (EUA). This EUA will remain  in effect (meaning this test can be used) for the duration of the COVID-19 declaration under Section 564(b)(1) of the Act, 21 U.S.C.section 360bbb-3(b)(1), unless the authorization is terminated  or revoked sooner.       Influenza A by PCR NEGATIVE NEGATIVE Final   Influenza B by PCR NEGATIVE NEGATIVE Final    Comment: (NOTE) The Xpert Xpress SARS-CoV-2/FLU/RSV plus assay is intended as an aid in the diagnosis of influenza from Nasopharyngeal swab specimens and should not be used as a sole basis for treatment.  Nasal washings and aspirates are unacceptable for Xpert Xpress SARS-CoV-2/FLU/RSV testing.  Fact Sheet for Patients: EntrepreneurPulse.com.au  Fact Sheet for Healthcare Providers: IncredibleEmployment.be  This test is not yet approved or cleared by the [place] FDA and has been authorized for detection and/or diagnosis of SARS-CoV-2 by FDA under an Emergency Use Authorization (EUA). This EUA will remain in effect (meaning this test can be used) for the duration of the COVID-19 declaration under Section 564(b)(1) of the Act, 21 U.S.C. section 360bbb-3(b)(1), unless the authorization is terminated or revoked.  Performed at [facility], [place] [address] [place],  [address]          Radiology Studies: DG Chest 2 View  Result Date: 04/18/2021 CLINICAL DATA:  Questionable sepsis EXAM: CHEST - 2 VIEW COMPARISON:  04/06/2021 FINDINGS: Right dialysis catheter remains in place, unchanged. Small bilateral pleural effusions. Bibasilar atelectasis or infiltrates. Findings similar to prior study. Heart is normal size. No acute bony abnormality. IMPRESSION: Small bilateral pleural effusions with bibasilar atelectasis or infiltrates. Electronically Signed   By: Rolm Baptise M.D.   On: 04/18/2021 19:28    Scheduled Meds:  aspirin EC  81 mg Oral Daily   Chlorhexidine Gluconate Cloth  6 each Topical Daily   diltiazem  180 mg Oral Daily   ipratropium-albuterol  3 mL Nebulization QID   lanthanum  1,000 mg Oral TID WC   levothyroxine  50 mcg Oral QAC breakfast   pantoprazole  40 mg Oral BID   pravastatin  40 mg Oral QHS   sodium chloride flush  3 mL Intravenous Q12H   Continuous Infusions:  ceFEPime (MAXIPIME) IV       LOS: 0 days   Time spent: 75min  Kobi C Indyah Saulnier, DO [facility]  If 7PM-7AM, please contact night-coverage [email or web address]  04/19/2021, 7:34 AM

## 2021-04-19 NOTE — Progress Notes (Signed)
[facility]  Subjective  - no complaints.   Objective (!) 169/61 95 99 F (37.2 C) (Oral) 18 99%  Intake/Output Summary (Last 24 hours) at 04/19/2021 1150 Last data filed at 04/18/2021 2302 Gross per 24 hour  Intake --  Output 200 ml  Net -200 ml    Right arm AV fistula with good thrill Dressing changed in right antecubital fossa with much less serous drainage  Laboratory Lab Results: Recent Labs    04/18/21 1820 04/19/21 0619 04/19/21 0736  WBC 12.4* 9.2  --   HGB 7.9* 6.6* 7.0*  HCT 25.2* 20.8* 21.5*  PLT 350 254  --    BMET Recent Labs    04/18/21 1820 04/19/21 0619  NA 137 138  K 2.9* 3.6  CL 97* 100  CO2 30 27  GLUCOSE 116* 91  BUN 11 15  CREATININE 3.28* 4.04*  CALCIUM 8.2* 7.8*    COAG Lab Results  Component Value Date   INR 1.1 04/18/2021   INR 1.2 04/09/2021   INR 1.3 (H) 04/08/2021   No results found for: PTT  Assessment/Planning:  87 year old male admitted last night with concern for infection of right arm AV fistula.  He had dehiscence of his antecubital incision on the medial side where he had a brachiocephalic fistula by Dr. Stanford Breed.  I changed his dressing again at bedside this morning with half-inch iodoform packing and much less drainage.  I have wrapped his arm with an Ace to help with some of the swelling.  Discussed with meticulous wound care we may be able to get this healed without additional OR intervention but we will have to monitor.  Marty Heck 04/19/2021 11:50 AM --

## 2021-04-19 NOTE — Progress Notes (Signed)
HOSPITAL MEDICINE OVERNIGHT EVENT NOTE    Notified by nursing the patient's hemoglobin this morning is 6.6.  This is down from 7.9 yesterday evening on admission.  Nursing reports no clinical evidence of bleeding to explain this drop.  Patient has not received any intravenous fluids.  Patient is currently hemodynamically stable with no chest pain or shortness of breath.  We will obtain a stat repeat hemoglobin and hematocrit to confirm this substantial drop in addition to obtaining a type and screen.  Vernelle Emerald  MD [facility]

## 2021-04-20 DIAGNOSIS — K861 Other chronic pancreatitis: Secondary | ICD-10-CM | POA: Diagnosis not present

## 2021-04-20 DIAGNOSIS — I4891 Unspecified atrial fibrillation: Secondary | ICD-10-CM | POA: Diagnosis not present

## 2021-04-20 DIAGNOSIS — T827XXA Infection and inflammatory reaction due to other cardiac and vascular devices, implants and grafts, initial encounter: Secondary | ICD-10-CM | POA: Diagnosis not present

## 2021-04-20 DIAGNOSIS — J449 Chronic obstructive pulmonary disease, unspecified: Secondary | ICD-10-CM | POA: Diagnosis not present

## 2021-04-20 LAB — BASIC METABOLIC PANEL
Anion gap: 10 (ref 5–15)
BUN: 22 mg/dL (ref 8–23)
CO2: 27 mmol/L (ref 22–32)
Calcium: 7.9 mg/dL — ABNORMAL LOW (ref 8.9–10.3)
Chloride: 101 mmol/L (ref 98–111)
Creatinine, Ser: 5.18 mg/dL — ABNORMAL HIGH (ref 0.61–1.24)
GFR, Estimated: 10 mL/min — ABNORMAL LOW (ref >60)
Glucose, Bld: 95 mg/dL (ref 70–99)
Potassium: 3.4 mmol/L — ABNORMAL LOW (ref 3.5–5.1)
Sodium: 138 mmol/L (ref 135–145)

## 2021-04-20 LAB — TYPE AND SCREEN
ABO/RH(D): O POS
Antibody Screen: NEGATIVE
Unit division: 0

## 2021-04-20 LAB — CBC
HCT: 24.4 % — ABNORMAL LOW (ref 39.0–52.0)
Hemoglobin: 8.1 g/dL — ABNORMAL LOW (ref 13.0–17.0)
MCH: 30.1 pg (ref 26.0–34.0)
MCHC: 33.2 g/dL (ref 30.0–36.0)
MCV: 90.7 fL (ref 80.0–100.0)
Platelets: 253 10*3/uL (ref 150–400)
RBC: 2.69 MIL/uL — ABNORMAL LOW (ref 4.22–5.81)
RDW: 15.9 % — ABNORMAL HIGH (ref 11.5–15.5)
WBC: 8.1 10*3/uL (ref 4.0–10.5)
nRBC: 0 % (ref 0.0–0.2)

## 2021-04-20 LAB — BPAM RBC
Blood Product Expiration Date: 202303292359
ISSUE DATE / TIME: 202302251323
Unit Type and Rh: 5100

## 2021-04-20 MED ORDER — CAMPHOR-MENTHOL 0.5-0.5 % EX LOTN
TOPICAL_LOTION | CUTANEOUS | Status: DC | PRN
  Filled 2021-04-20: qty 222

## 2021-04-20 MED ORDER — CHLORHEXIDINE GLUCONATE CLOTH 2 % EX PADS
6.0000 | MEDICATED_PAD | Freq: Every day | CUTANEOUS | Status: DC
  Administered 2021-04-22: 6 via TOPICAL

## 2021-04-20 MED ORDER — MELATONIN 5 MG PO TABS
10.0000 mg | ORAL_TABLET | Freq: Every evening | ORAL | Status: DC | PRN
  Administered 2021-04-20 – 2021-04-24 (×4): 10 mg via ORAL
  Filled 2021-04-20 (×5): qty 2

## 2021-04-20 MED ORDER — PROSOURCE PLUS PO LIQD
30.0000 mL | Freq: Two times a day (BID) | ORAL | Status: DC
  Administered 2021-04-21 – 2021-04-24 (×4): 30 mL via ORAL
  Filled 2021-04-20 (×6): qty 30

## 2021-04-20 MED ORDER — NEPRO/CARBSTEADY PO LIQD
237.0000 mL | Freq: Three times a day (TID) | ORAL | Status: DC
  Administered 2021-04-20 – 2021-04-24 (×7): 237 mL via ORAL

## 2021-04-20 NOTE — Progress Notes (Signed)
[facility]  Subjective  - no complaints.  Feels drainage improving.   Objective (!) 167/50 89 98.3 F (36.8 C) 18 90%  Intake/Output Summary (Last 24 hours) at 04/20/2021 1034 Last data filed at 04/20/2021 0800 Gross per 24 hour  Intake 1341.04 ml  Output 425 ml  Net 916.04 ml    Right arm AV fistula with good thrill Dressing changed in right antecubital fossa with much less serous drainage, no signs of cellulitis or purulent drainage  Laboratory Lab Results: Recent Labs    04/19/21 0619 04/19/21 0736 04/20/21 0341  WBC 9.2  --  8.1  HGB 6.6* 7.0* 8.1*  HCT 20.8* 21.5* 24.4*  PLT 254  --  253   BMET Recent Labs    04/19/21 0619 04/20/21 0341  NA 138 138  K 3.6 3.4*  CL 100 101  CO2 27 27  GLUCOSE 91 95  BUN 15 22  CREATININE 4.04* 5.18*  CALCIUM 7.8* 7.9*    COAG Lab Results  Component Value Date   INR 1.1 04/18/2021   INR 1.2 04/09/2021   INR 1.3 (H) 04/08/2021   No results found for: PTT  Assessment/Planning:  87 year old male admitted Friday night with concern for infection of right arm AV fistula.  He had dehiscence of his antecubital incision on the medial side where he had a brachiocephalic fistula by Dr. Stanford Breed.  I changed his dressing again at bedside this morning with half-inch iodoform packing and much less drainage.  No signs of cellulitis or purulent drainage.  Trying good wound care.  If this fails may require going to the OR but drainage seems to be improving.  We will continue to follow.  Marty Heck 04/20/2021 10:34 AM --

## 2021-04-20 NOTE — Progress Notes (Addendum)
PROGRESS NOTE    Wayne Meadows  PZW:[id number] DOB: 1934/04/10 DOA: 04/18/2021 PCP: [name]   Brief Narrative:  Wayne Meadows is a 87 y.o. male with medical history significant of colitis, renal cell cancer, recurrent cellulitis, paroxysmal A-fib, hyperlipidemia, hypertension, CAD, hypothyroidism, ESRD on HD, gastric ulcer, GI bleed, COPD, anxiety, CVA presenting with persistent arm/fistula infection. **UPDATE - patient was NOT on antibiotics - these were started at intake. Presented for worsening pain and infection at fistula site.  Assessment & Plan:   Principal Problem:   Arteriovenous fistula infection ([place]) Active Problems:   Atrial fibrillation (HCC)   COPD (chronic obstructive pulmonary disease) (HCC)   Essential hypertension   Hypothyroidism   History of CVA (cerebrovascular accident)   Other hyperlipidemia   Chronic pancreatitis (HCC)   ESRD (end stage renal disease) on dialysis ([place])   Sepsis secondary to AV fistula infection, POA > Patient was not previously on outpatient antibiotics (this was reported at intake but is inaccurate) - he is currently on vancomycin and cefepime -ID following, recommending imaging of the right upper extremity to rule out deep tissue infection or abscess -we will need to schedule this prior to dialysis given need for contrast - Vascular surgery/nephrology/ID consulted - Cultures pending, afebrile since admission   Acute symptomatic anemia, unclear source Rule out acute blood loss anemia on chronic anemia of chronic disease with concurrent chronic iron deficiency anemia -Recently admitted for GI bleed, reports ongoing black stool on occasion -Continue transfusion hemoglobin less than 7 or symptomatic, blood ideally transfused with dialysis to help moderate volume status -1 unit PRBC ordered  -Repeat H&H today stable -Continue to hold any anticoagulation -discontinue aspirin  ESRD Hypokalemia > On HD Monday  Wednesday Friday > Concern for fistula infection as above -Nephrology following   Hypertension - Continue home diltiazem, lisinopril -currently well controlled  Hyperlipidemia CAD Hx CVA - Continue home pravastatin, aspirin  COPD - Continue home Combivent and as needed albuterol Chronic pancreatitis - Stable, continue to monitor   DVT prophylaxis:  SCDs Code Status:  Full Family Communication: None available  Status is: Inpatient  Dispo: The patient is from: Facility              Anticipated d/c is to: Same              Anticipated d/c date is: 48 to 72 hours              Patient currently not medically stable for discharge  Consultants:  Nephrology, vascular surgery  Procedures:  None  Antimicrobials:  Vancomycin, cefepime  Subjective: No acute issues or events overnight, denies nausea vomiting diarrhea constipation headache fevers chills or chest pain.  Ongoing chronic low back pain and right upper extremity pain around previous fistula surgical site well controlled on current regimen.  Objective: Vitals:   04/19/21 2027 04/20/21 0657 04/20/21 0843 04/20/21 0940  BP: (!) 159/59 (!) 175/62  (!) 167/50  Pulse: 89 91  89  Resp: 18 17  18   Temp: 98.6 F (37 C) 98.3 F (36.8 C)  98.3 F (36.8 C)  TempSrc: Oral Oral    SpO2: 93% 94% 94% 90%  Weight: 82 kg     Height: 6\' 2"  (1.88 m)       Intake/Output Summary (Last 24 hours) at 04/20/2021 1048 Last data filed at 04/20/2021 0800 Gross per 24 hour  Intake 1341.04 ml  Output 425 ml  Net 916.04 ml    Danley Danker  Weights   04/18/21 2304 04/19/21 0500 04/19/21 2027  Weight: 92.5 kg 81.7 kg 82 kg    Examination:  General:  Pleasantly resting in bed, No acute distress. HEENT:  Normocephalic atraumatic.  Sclerae nonicteric, noninjected.  Extraocular movements intact bilaterally. Neck:  Without mass or deformity.  Trachea is midline. Lungs:  Clear to auscultate bilaterally without rhonchi, wheeze, or rales. Heart:   Regular rate and rhythm.  Without murmurs, rubs, or gallops. Abdomen:  Soft, nontender, nondistended.  Without guarding or rebound. Extremities: Right upper extremity fistula bandage with clear serous drainage  Data Reviewed: I have personally reviewed following labs and imaging studies  CBC: Recent Labs  Lab 04/18/21 1820 04/19/21 0619 04/19/21 0736 04/20/21 0341  WBC 12.4* 9.2  --  8.1  NEUTROABS 9.3*  --   --   --   HGB 7.9* 6.6* 7.0* 8.1*  HCT 25.2* 20.8* 21.5* 24.4*  MCV 92.6 92.4  --  90.7  PLT 350 254  --  381    Basic Metabolic Panel: Recent Labs  Lab 04/18/21 1820 04/18/21 2120 04/19/21 0619 04/20/21 0341  NA 137  --  138 138  K 2.9*  --  3.6 3.4*  CL 97*  --  100 101  CO2 30  --  27 27  GLUCOSE 116*  --  91 95  BUN 11  --  15 22  CREATININE 3.28*  --  4.04* 5.18*  CALCIUM 8.2*  --  7.8* 7.9*  MG  --  1.5*  --   --     GFR: Estimated Creatinine Clearance: 12.3 mL/min (A) (by C-G formula based on SCr of 5.18 mg/dL (H)). Liver Function Tests: Recent Labs  Lab 04/18/21 1820 04/19/21 0619  AST 18 13*  ALT 7 6  ALKPHOS 84 65  BILITOT 0.3 0.2*  PROT 6.0* 5.1*  ALBUMIN 2.2* 1.8*    No results for input(s): LIPASE, AMYLASE in the last 168 hours. No results for input(s): AMMONIA in the last 168 hours. Coagulation Profile: Recent Labs  Lab 04/18/21 1820  INR 1.1    Cardiac Enzymes: No results for input(s): CKTOTAL, CKMB, CKMBINDEX, TROPONINI in the last 168 hours. BNP (last 3 results) No results for input(s): PROBNP in the last 8760 hours. HbA1C: No results for input(s): HGBA1C in the last 72 hours. CBG: No results for input(s): GLUCAP in the last 168 hours. Lipid Profile: No results for input(s): CHOL, HDL, LDLCALC, TRIG, CHOLHDL, LDLDIRECT in the last 72 hours. Thyroid Function Tests: No results for input(s): TSH, T4TOTAL, FREET4, T3FREE, THYROIDAB in the last 72 hours. Anemia Panel: No results for input(s): VITAMINB12, FOLATE, FERRITIN,  TIBC, IRON, RETICCTPCT in the last 72 hours. Sepsis Labs: Recent Labs  Lab 04/18/21 1820 04/18/21 2120  LATICACIDVEN 2.0* 1.2     Recent Results (from the past 240 hour(s))  Resp Panel by RT-PCR (Flu A&B, Covid) Nasopharyngeal Swab     Status: None   Collection Time: 04/18/21  6:08 PM   Specimen: Nasopharyngeal Swab; Nasopharyngeal(NP) swabs in vial transport medium  Result Value Ref Range Status   SARS Coronavirus 2 by RT PCR NEGATIVE NEGATIVE Final    Comment: (NOTE) SARS-CoV-2 target nucleic acids are NOT DETECTED.  The SARS-CoV-2 RNA is generally detectable in upper respiratory specimens during the acute phase of infection. The lowest concentration of SARS-CoV-2 viral copies this assay can detect is 138 copies/mL. A negative result does not preclude SARS-Cov-2 infection and should not be used as the sole basis for  treatment or other patient management decisions. A negative result may occur with  improper specimen collection/handling, submission of specimen other than nasopharyngeal swab, presence of viral mutation(s) within the areas targeted by this assay, and inadequate number of viral copies(<138 copies/mL). A negative result must be combined with clinical observations, patient history, and epidemiological information. The expected result is Negative.  Fact Sheet for Patients:  EntrepreneurPulse.com.au  Fact Sheet for Healthcare Providers:  IncredibleEmployment.be  This test is no t yet approved or cleared by the [place] FDA and  has been authorized for detection and/or diagnosis of SARS-CoV-2 by FDA under an Emergency Use Authorization (EUA). This EUA will remain  in effect (meaning this test can be used) for the duration of the COVID-19 declaration under Section 564(b)(1) of the Act, 21 U.S.C.section 360bbb-3(b)(1), unless the authorization is terminated  or revoked sooner.       Influenza A by PCR NEGATIVE NEGATIVE  Final   Influenza B by PCR NEGATIVE NEGATIVE Final    Comment: (NOTE) The Xpert Xpress SARS-CoV-2/FLU/RSV plus assay is intended as an aid in the diagnosis of influenza from Nasopharyngeal swab specimens and should not be used as a sole basis for treatment. Nasal washings and aspirates are unacceptable for Xpert Xpress SARS-CoV-2/FLU/RSV testing.  Fact Sheet for Patients: EntrepreneurPulse.com.au  Fact Sheet for Healthcare Providers: IncredibleEmployment.be  This test is not yet approved or cleared by the [place] FDA and has been authorized for detection and/or diagnosis of SARS-CoV-2 by FDA under an Emergency Use Authorization (EUA). This EUA will remain in effect (meaning this test can be used) for the duration of the COVID-19 declaration under Section 564(b)(1) of the Act, 21 U.S.C. section 360bbb-3(b)(1), unless the authorization is terminated or revoked.  Performed at [facility], [place] [address], [place], [place] [address]   Urine Culture     Status: Abnormal (Preliminary result)   Collection Time: 04/18/21  6:08 PM   Specimen: In/Out Cath Urine  Result Value Ref Range Status   Specimen Description IN/OUT CATH URINE  Final   Special Requests NONE  Final   Culture (A)  Final    >=100,000 COLONIES/mL GRAM NEGATIVE RODS SUSCEPTIBILITIES TO FOLLOW Performed at [facility], [place] [address], [place], [place] [address]    Report Status PENDING  Incomplete  Blood Culture (routine x 2)     Status: None (Preliminary result)   Collection Time: 04/18/21  6:20 PM   Specimen: BLOOD  Result Value Ref Range Status   Specimen Description BLOOD SITE NOT SPECIFIED  Final   Special Requests   Final    BOTTLES DRAWN AEROBIC AND ANAEROBIC Blood Culture results may not be optimal due to an excessive volume of blood received in culture bottles   Culture   Final    NO GROWTH 2 DAYS Performed at [facility], [place] [address],  [place], [place] [address]    Report Status PENDING  Incomplete  Blood Culture (routine x 2)     Status: None (Preliminary result)   Collection Time: 04/18/21  9:20 PM   Specimen: BLOOD  Result Value Ref Range Status   Specimen Description BLOOD BLOOD LEFT ARM  Final   Special Requests   Final    BOTTLES DRAWN AEROBIC AND ANAEROBIC Blood Culture adequate volume   Culture   Final    NO GROWTH 2 DAYS Performed at [facility], [place] [address], [place], [place] [address]    Report Status  PENDING  Incomplete     Radiology Studies: DG Chest 2 View  Result Date: 04/18/2021 CLINICAL DATA:  Questionable sepsis EXAM: CHEST - 2 VIEW COMPARISON:  04/06/2021 FINDINGS: Right dialysis catheter remains in place, unchanged. Small bilateral pleural effusions. Bibasilar atelectasis or infiltrates. Findings similar to prior study. Heart is normal size. No acute bony abnormality. IMPRESSION: Small bilateral pleural effusions with bibasilar atelectasis or infiltrates. Electronically Signed   By: Rolm Baptise M.D.   On: 04/18/2021 19:28    Scheduled Meds:  Chlorhexidine Gluconate Cloth  6 each Topical Daily   diltiazem  180 mg Oral Daily   ipratropium-albuterol  3 mL Nebulization QID   lanthanum  1,000 mg Oral TID WC   levothyroxine  50 mcg Oral QAC breakfast   pantoprazole  40 mg Oral BID   pravastatin  40 mg Oral QHS   sodium chloride flush  3 mL Intravenous Q12H   Continuous Infusions:  ceFEPime (MAXIPIME) IV 1 g (04/19/21 1752)   [START ON 04/21/2021] vancomycin       LOS: 1 day   Time spent: 73min  [place] C Carolyn Maniscalco, DO [facility]  If 7PM-7AM, please contact night-coverage [email or web address]  04/20/2021, 10:48 AM

## 2021-04-20 NOTE — Consult Note (Signed)
[facility] Renal Consultation Note    Indication for Consultation:  Management of ESRD/hemodialysis; anemia, hypertension/volume and secondary hyperparathyroidism  RVU:YEBXID, [name]  HPI: Wayne Meadows is a 87 y.o. male with ESRD 2/2 cardiorenal syndrome on HD MWF at [facility].  New start to dialysis just starting HD on 04/01/21.  Past medical history significant for CVA and CAD, diabetes mellitus, COPD, hypertension, chronic pancreatitis and Hx renal carcinoma.  Multiple recent admissions 1/23-2/10 due to AKI on CKD >ESRD starting dialysis and 2/12-2/17 due to upper GIB.  Patient sent to ED from dialysis due to RUE infection.  Dialysis nurse unwrapped AVF noting "the pt's suture site was (+) for redness, drainage, and pus.  It also has a hole where fluid comes out as it pulsates, pt said it is painful."  They collected wound and blood cultures and gave initial dose of Vanc and Fortaz.  They sent the patient to the hospital non emergently for further evaluation.  Seen and examined at bedside.  Reports ongoing drainage from incision for about 2 weeks.  States it is painful.  Denies CP, SOB, abdominal pain, fever, chills and n/v/d.    Pertinent findings since admission include hypertension, CXR with no acute findings, and Hgb drop 7.9>6.6, now improved to 8.1 s/p 1 unit pRBC.  Patient admitted for further evaluation and management.   Past Medical History:  Diagnosis Date   Abnormal echocardiogram 03/08/2021   Abnormal finding on GI tract imaging    Acute blood loss anemia    Acute pancreatitis 08/12/2020   Arthritis    Carotid stenosis    Community acquired pneumonia 04/06/2021   COPD (chronic obstructive pulmonary disease) (HCC)    Coronary artery disease    S/p PCI 2011;  NSTEMI 12/12:  LHC/PCI 02/23/11: LAD 60% after the septal perforator, D1 occluded with distal collaterals, proximal RI 30-40%, AV circumflex stent patent with 60% stenosis after the stent, RCA 99%, EF  60-65%.  His RCA was treated with a bare-metal stent   CVA (cerebral infarction) 2011   Right cerebral; total obstruction of the right ICA   Diverticulitis    Hypertension    Paroxysmal atrial fibrillation with RVR ([place]) 04/09/2021   Pleuritic chest pain 04/08/2014   Pressure injury of skin 03/30/2021   Rash 03/05/2021   Rectal bleeding 07/2015   Refractory nausea and vomiting 06/20/2020   Renal carcinoma ([place])    Sepsis, unspecified organism ([place]) 04/11/2016   Stroke ([employer])    Tobacco abuse, in remission    Past Surgical History:  Procedure Laterality Date   AV FISTULA PLACEMENT Right 04/03/2021   Procedure: ARTERIOVENOUS (AV) CREATION OF RIGHT ARM BRACHIOCEPHALIC FISTULA;  Surgeon: Cherre Robins, MD;  Location: [place];  Service: Vascular;  Laterality: Right;   BIOPSY  04/08/2021   Procedure: BIOPSY;  Surgeon: Lavena Bullion, DO;  Location: [place];  Service: Gastroenterology;;   COLON SURGERY     COLONOSCOPY N/A 08/22/2015   Procedure: COLONOSCOPY;  Surgeon: [name], MD;  Location: [facility];  Service: Endoscopy;  Laterality: N/A;   ESOPHAGOGASTRODUODENOSCOPY N/A 07/18/2020   Procedure: ESOPHAGOGASTRODUODENOSCOPY (EGD);  Surgeon: Milus Banister, MD;  Location: [facility];  Service: Endoscopy;  Laterality: N/A;   ESOPHAGOGASTRODUODENOSCOPY (EGD) WITH PROPOFOL N/A 06/21/2020   Procedure: ESOPHAGOGASTRODUODENOSCOPY (EGD) WITH PROPOFOL;  Surgeon: Irene Shipper, MD;  Location: [facility];  Service: Endoscopy;  Laterality: N/A;   ESOPHAGOGASTRODUODENOSCOPY (EGD) WITH PROPOFOL N/A 04/08/2021   Procedure: ESOPHAGOGASTRODUODENOSCOPY (EGD) WITH PROPOFOL;  Surgeon: Lavena Bullion, DO;  Location: [facility] ENDOSCOPY;  Service: Gastroenterology;  Laterality: N/A;   EUS N/A 07/18/2020   Procedure: UPPER ENDOSCOPIC ULTRASOUND (EUS) RADIAL;  Surgeon: Milus Banister, MD;  Location: [facility];  Service: Endoscopy;  Laterality: N/A;   FINE NEEDLE ASPIRATION N/A 07/18/2020    Procedure: FINE NEEDLE ASPIRATION (FNA) LINEAR;  Surgeon: Milus Banister, MD;  Location: [facility];  Service: Endoscopy;  Laterality: N/A;   HEMOSTASIS CLIP PLACEMENT  04/08/2021   Procedure: HEMOSTASIS CLIP PLACEMENT;  Surgeon: Lavena Bullion, DO;  Location: [place];  Service: Gastroenterology;;   hip relacement     IR FLUORO GUIDE CV LINE RIGHT  03/31/2021   IR US GUIDE VASC ACCESS RIGHT  03/31/2021   KIDNEY SURGERY     LEFT HEART CATH AND CORONARY ANGIOGRAPHY N/A 07/09/2020   Procedure: LEFT HEART CATH AND CORONARY ANGIOGRAPHY;  Surgeon: Leonie Man, MD;  Location: [facility] CV LAB;  Service: Cardiovascular;  Laterality: N/A;   LEFT HEART CATHETERIZATION WITH CORONARY ANGIOGRAM N/A 02/23/2011   Procedure: LEFT HEART CATHETERIZATION WITH CORONARY ANGIOGRAM;  Surgeon: [name], MD;  Location: [facility] CATH LAB;  Service: Cardiovascular;  Laterality: N/A;   LEFT HEART CATHETERIZATION WITH CORONARY ANGIOGRAM N/A 03/18/2011   Procedure: LEFT HEART CATHETERIZATION WITH CORONARY ANGIOGRAM;  Surgeon: [name], MD;  Location: [facility] CATH LAB;  Service: Cardiovascular;  Laterality: N/A;   PERCUTANEOUS CORONARY STENT INTERVENTION (PCI-S) N/A 02/23/2011   Procedure: PERCUTANEOUS CORONARY STENT INTERVENTION (PCI-S);  Surgeon: [name], MD;  Location: [facility] CATH LAB;  Service: Cardiovascular;  Laterality: N/A;   TEMPORARY PACEMAKER INSERTION N/A 02/23/2011   Procedure: TEMPORARY PACEMAKER INSERTION;  Surgeon: [name], MD;  Location: [facility] CATH LAB;  Service: Cardiovascular;  Laterality: N/A;   THROMBECTOMY W/ EMBOLECTOMY Right 04/03/2021   Procedure: THROMBECTOMY ARTERIOVENOUS FISTULA;  Surgeon: Cherre Robins, MD;  Location: [facility] OR;  Service: Vascular;  Laterality: Right;   Family History  Problem Relation Age of Onset   Heart attack Other 65   Social History:  reports that he quit smoking about 14 years ago. His smoking use included cigarettes. He has never used smokeless  tobacco. He reports that he does not currently use alcohol after a past usage of about 1.0 standard drink per week. He reports that he does not use drugs. Allergies  Allergen Reactions   Bee Venom Anaphylaxis    Has epi pen   Influenza Vaccines Other (See Comments)    "Mortally sick for 2 weeks"   Prior to Admission medications   Medication Sig Start Date End Date Taking? Authorizing Provider  acetaminophen (TYLENOL) 325 MG tablet Take 2 tablets (650 mg total) by mouth every 6 (six) hours as needed for mild pain (or Fever >/= 101). 04/10/21   Bonnielee Haff, MD  albuterol (VENTOLIN HFA) 108 (90 Base) MCG/ACT inhaler Inhale 1 puff into the lungs every 6 (six) hours as needed for shortness of breath.    [provider]  aspirin EC 81 MG tablet Take 81 mg by mouth daily. Patient not taking: Reported on 04/06/2021    [provider]  cloNIDine (CATAPRES) 0.1 MG tablet Take 1 tablet (0.1 mg total) by mouth daily. Patient not taking: Reported on 04/06/2021 06/24/20   Harvie Heck, MD  diltiazem (CARDIZEM CD) 180 MG 24 hr capsule Take 1 capsule (180 mg total) by mouth daily. 04/11/21   Bonnielee Haff, MD  ferrous sulfate 325 (65 FE) MG tablet  Take 1 tablet by mouth. Every Monday, Wednesday, and Friday Patient not taking: Reported on 04/06/2021 07/30/20   [provider]  hydrALAZINE (APRESOLINE) 10 MG tablet Take 1 tablet (10 mg total) by mouth every 8 (eight) hours as needed (for SBP greater than 180). 04/04/21   Little Ishikawa, MD  HYDROcodone-acetaminophen (NORCO/VICODIN) 5-325 MG tablet Take 1-2 tablets by mouth every 4 (four) hours as needed for moderate pain or severe pain. 04/10/21   Bonnielee Haff, MD  Ipratropium-Albuterol (COMBIVENT) 20-100 MCG/ACT AERS respimat Inhale 1 puff into the lungs 4 (four) times daily.    [provider]  lanthanum (FOSRENOL) 1000 MG chewable tablet Chew 1 tablet (1,000 mg total) by mouth 3 (three) times daily with meals. 04/04/21    Little Ishikawa, MD  levothyroxine (SYNTHROID) 50 MCG tablet Take 50 mcg by mouth daily before breakfast. Patient not taking: Reported on 04/06/2021    [provider]  Multiple Vitamin (MULTIVITAMIN) tablet Take 1 tablet by mouth daily. Patient not taking: Reported on 04/06/2021    [provider]  neomycin-bacitracin-polymyxin (NEOSPORIN) OINT Apply 1 application topically 3 (three) times daily. To right antecubital area 04/10/21   Bonnielee Haff, MD  ondansetron (ZOFRAN) 4 MG tablet Take 1 tablet (4 mg total) by mouth daily as needed for nausea or vomiting. 04/11/21 04/11/22  Bonnielee Haff, MD  pantoprazole (PROTONIX) 40 MG tablet Take 1 tablet (40 mg total) by mouth 2 (two) times daily. 04/10/21   Bonnielee Haff, MD  polyethylene glycol (MIRALAX / GLYCOLAX) 17 g packet Take 17 g by mouth daily as needed for mild constipation or moderate constipation. Patient not taking: Reported on 04/06/2021 08/15/20   [name], MD  pravastatin (PRAVACHOL) 40 MG tablet Take 40 mg by mouth at bedtime.    [provider]  tamsulosin (FLOMAX) 0.4 MG CAPS capsule Take 1 capsule (0.4 mg total) by mouth daily. 04/11/21   Bonnielee Haff, MD   Current Facility-Administered Medications  Medication Dose Route Frequency Provider Last Rate Last Admin   acetaminophen (TYLENOL) tablet 650 mg  650 mg Oral Q6H PRN Marcelyn Bruins, MD       Or   acetaminophen (TYLENOL) suppository 650 mg  650 mg Rectal Q6H PRN Marcelyn Bruins, MD       albuterol (PROVENTIL) (2.5 MG/3ML) 0.083% nebulizer solution 3 mL  3 mL Inhalation Q6H PRN Marcelyn Bruins, MD       ceFEPIme (MAXIPIME) 1 g in sodium chloride 0.9 % 100 mL IVPB  1 g Intravenous Q24H Marcelyn Bruins, MD 200 mL/hr at 04/19/21 1752 1 g at 04/19/21 1752   Chlorhexidine Gluconate Cloth 2 % PADS 6 each  6 each Topical Daily Little Ishikawa, MD   6 each at 04/20/21 0919   diltiazem (CARDIZEM CD) 24 hr capsule 180 mg  180 mg  Oral Daily Marcelyn Bruins, MD   180 mg at 04/20/21 0834   HYDROcodone-acetaminophen (NORCO/VICODIN) 5-325 MG per tablet 1-2 tablet  1-2 tablet Oral Q4H PRN Marcelyn Bruins, MD   2 tablet at 04/20/21 0836   ipratropium-albuterol (DUONEB) 0.5-2.5 (3) MG/3ML nebulizer solution 3 mL  3 mL Nebulization QID Marcelyn Bruins, MD   3 mL at 04/20/21 0841   lanthanum (FOSRENOL) chewable tablet 1,000 mg  1,000 mg Oral TID WC Marcelyn Bruins, MD   1,000 mg at 04/19/21 1751   levothyroxine (SYNTHROID) tablet 50 mcg  50 mcg Oral QAC breakfast Marcelyn Bruins, MD  50 mcg at 04/20/21 0834   pantoprazole (PROTONIX) EC tablet 40 mg  40 mg Oral BID Marcelyn Bruins, MD   40 mg at 04/20/21 4196   pravastatin (PRAVACHOL) tablet 40 mg  40 mg Oral QHS Marcelyn Bruins, MD   40 mg at 04/19/21 2236   sodium chloride flush (NS) 0.9 % injection 3 mL  3 mL Intravenous Q12H Marcelyn Bruins, MD   3 mL at 04/19/21 0941   [START ON 04/21/2021] vancomycin (VANCOCIN) IVPB 1000 mg/200 mL premix  1,000 mg Intravenous Q M,W,F-HD Little Ishikawa, MD       Labs: Basic Metabolic Panel: Recent Labs  Lab 04/18/21 1820 04/19/21 0619 04/20/21 0341  NA 137 138 138  K 2.9* 3.6 3.4*  CL 97* 100 101  CO2 30 27 27   GLUCOSE 116* 91 95  BUN 11 15 22   CREATININE 3.28* 4.04* 5.18*  CALCIUM 8.2* 7.8* 7.9*   Liver Function Tests: Recent Labs  Lab 04/18/21 1820 04/19/21 0619  AST 18 13*  ALT 7 6  ALKPHOS 84 65  BILITOT 0.3 0.2*  PROT 6.0* 5.1*  ALBUMIN 2.2* 1.8*   CBC: Recent Labs  Lab 04/18/21 1820 04/19/21 0619 04/19/21 0736 04/20/21 0341  WBC 12.4* 9.2  --  8.1  NEUTROABS 9.3*  --   --   --   HGB 7.9* 6.6* 7.0* 8.1*  HCT 25.2* 20.8* 21.5* 24.4*  MCV 92.6 92.4  --  90.7  PLT 350 254  --  253   Studies/Results: DG Chest 2 View  Result Date: 04/18/2021 CLINICAL DATA:  Questionable sepsis EXAM: CHEST - 2 VIEW COMPARISON:  04/06/2021 FINDINGS: Right dialysis catheter remains in place,  unchanged. Small bilateral pleural effusions. Bibasilar atelectasis or infiltrates. Findings similar to prior study. Heart is normal size. No acute bony abnormality. IMPRESSION: Small bilateral pleural effusions with bibasilar atelectasis or infiltrates. Electronically Signed   By: Rolm Baptise M.D.   On: 04/18/2021 19:28    ROS: All others negative except those listed in HPI.  Physical Exam: Vitals:   04/19/21 2027 04/20/21 0657 04/20/21 0843 04/20/21 0940  BP: (!) 159/59 (!) 175/62  (!) 167/50  Pulse: 89 91  89  Resp: 18 17  18   Temp: 98.6 F (37 C) 98.3 F (36.8 C)  98.3 F (36.8 C)  TempSrc: Oral Oral    SpO2: 93% 94% 94% 90%  Weight: 82 kg     Height: 6\' 2"  (1.88 m)        General: chronically ill appearing, elderly male in NAD Head: NCAT sclera not icteric MMM Neck: Supple. No lymphadenopathy Lungs: mostly CTA bilaterally. +wheeze on R. No rales or rhonchi. Breathing is unlabored on 2L O2 via [place] Heart: RRR. No murmur, rubs or gallops.  Abdomen: soft, nontender, +BS, no guarding, no rebound tenderness Lower extremities:1+ edema, LLE dressed  Neuro: AAOx3. Moves all extremities spontaneously. Psych:  Responds to questions appropriately with a normal affect. Dialysis Access:TDC, RU AVF w/ACE bandage with drainage present  Dialysis Orders:  MWF [facility] 3h 300/500 83kg 2K 2.25Ca  TDC , RU AVF maturing Hep 3000  mircera 50q2wks (not yet given)   Assessment/Plan:  Infected RUE wound - wound culture and blood culture taken at OP dialysis center but have not yet resulted. BC collected here as well.  Given initial dose of Vanc/Fortaz on 2/24. Wound packed by VVS with daily dressing changes.  May need to go to OR for washout if not improved but  per VVS looks like its improving.  ID consulted, CT w/contrast ordered for tomorrow to see if any fluid collection present.   ESRD -  on HD MWF.  Will write orders for HD following CT tomorrow.  Hypertension/volume  - BP elevated, continue home  meds.  Meeting EDW, +edema, UF as tolerated.    Acute on chronic Anemia of CKD - recent admit for GIB.  Hgb drop since admit 7.9>6.6, now improved to 8.1 s/p 1 unit pRBC.  Will give aranesp with HD tomorrow.  Transfuse prn.  Secondary Hyperparathyroidism -  Corrected Ca in goal.  Not on VDRA or binders.  Check phos. .     Nutrition - Alb 1.8 - start protein supplements.  Renal diet w/fluid restrictions.  Hx CVA Hx COPD on 2L O2 at baseline  Jen Mow, PA-C [employer] 04/20/2021, 11:49 AM

## 2021-04-21 DIAGNOSIS — T8130XA Disruption of wound, unspecified, initial encounter: Secondary | ICD-10-CM | POA: Diagnosis not present

## 2021-04-21 LAB — BASIC METABOLIC PANEL
Anion gap: 8 (ref 5–15)
BUN: 32 mg/dL — ABNORMAL HIGH (ref 8–23)
CO2: 28 mmol/L (ref 22–32)
Calcium: 7.8 mg/dL — ABNORMAL LOW (ref 8.9–10.3)
Chloride: 102 mmol/L (ref 98–111)
Creatinine, Ser: 6.76 mg/dL — ABNORMAL HIGH (ref 0.61–1.24)
GFR, Estimated: 7 mL/min — ABNORMAL LOW (ref >60)
Glucose, Bld: 108 mg/dL — ABNORMAL HIGH (ref 70–99)
Potassium: 3.7 mmol/L (ref 3.5–5.1)
Sodium: 138 mmol/L (ref 135–145)

## 2021-04-21 LAB — URINE CULTURE: Culture: >=100000 — AB

## 2021-04-21 LAB — CBC
HCT: 23.4 % — ABNORMAL LOW (ref 39.0–52.0)
Hemoglobin: 7.6 g/dL — ABNORMAL LOW (ref 13.0–17.0)
MCH: 29.8 pg (ref 26.0–34.0)
MCHC: 32.5 g/dL (ref 30.0–36.0)
MCV: 91.8 fL (ref 80.0–100.0)
Platelets: 250 10*3/uL (ref 150–400)
RBC: 2.55 MIL/uL — ABNORMAL LOW (ref 4.22–5.81)
RDW: 15.8 % — ABNORMAL HIGH (ref 11.5–15.5)
WBC: 8.8 10*3/uL (ref 4.0–10.5)
nRBC: 0 % (ref 0.0–0.2)

## 2021-04-21 MED ORDER — IPRATROPIUM-ALBUTEROL 0.5-2.5 (3) MG/3ML IN SOLN
3.0000 mL | Freq: Two times a day (BID) | RESPIRATORY_TRACT | Status: DC
  Administered 2021-04-21 – 2021-04-24 (×5): 3 mL via RESPIRATORY_TRACT
  Filled 2021-04-21 (×6): qty 3

## 2021-04-21 MED ORDER — DARBEPOETIN ALFA 60 MCG/0.3ML IJ SOSY
60.0000 ug | PREFILLED_SYRINGE | INTRAMUSCULAR | Status: DC
  Administered 2021-04-21: 60 ug via INTRAVENOUS
  Filled 2021-04-21: qty 0.3

## 2021-04-21 MED ORDER — ONDANSETRON HCL 4 MG/2ML IJ SOLN
4.0000 mg | Freq: Four times a day (QID) | INTRAMUSCULAR | Status: DC | PRN
Start: 2021-04-21 — End: 2021-04-25
  Administered 2021-04-21: 4 mg via INTRAVENOUS
  Filled 2021-04-21: qty 2

## 2021-04-21 MED ORDER — HEPARIN SODIUM (PORCINE) 1000 UNIT/ML IJ SOLN
INTRAMUSCULAR | Status: AC
  Administered 2021-04-21: 1000 [IU]
  Filled 2021-04-21: qty 4

## 2021-04-21 NOTE — Progress Notes (Signed)
[facility] for Infectious Disease   Reason for visit: Follow up on concern for infection of AV fistula  Interval History: WBC wnl, no fever.  No rash.   Day 4 total antibiotics  Physical Exam: Constitutional:  Vitals:   04/21/21 1130 04/21/21 1200  BP: (!) 180/79 (!) 180/77  Pulse: (!) 101 97  Resp: 14 14  Temp:    SpO2: 97% 98%   patient appears in NAD Respiratory: Normal respiratory effort MS: right arm wrapped  Review of Systems: Constitutional: negative for fevers and chills  Lab Results  Component Value Date   WBC 8.8 04/21/2021   HGB 7.6 (L) 04/21/2021   HCT 23.4 (L) 04/21/2021   MCV 91.8 04/21/2021   PLT 250 04/21/2021    Lab Results  Component Value Date   CREATININE 6.76 (H) 04/21/2021   BUN 32 (H) 04/21/2021   NA 138 04/21/2021   K 3.7 04/21/2021   CL 102 04/21/2021   CO2 28 04/21/2021    Lab Results  Component Value Date   ALT 6 04/19/2021   AST 13 (L) 04/19/2021   ALKPHOS 65 04/19/2021     Microbiology: Recent Results (from the past 240 hour(s))  Resp Panel by RT-PCR (Flu A&B, Covid) Nasopharyngeal Swab     Status: None   Collection Time: 04/18/21  6:08 PM   Specimen: Nasopharyngeal Swab; Nasopharyngeal(NP) swabs in vial transport medium  Result Value Ref Range Status   SARS Coronavirus 2 by RT PCR NEGATIVE NEGATIVE Final    Comment: (NOTE) SARS-CoV-2 target nucleic acids are NOT DETECTED.  The SARS-CoV-2 RNA is generally detectable in upper respiratory specimens during the acute phase of infection. The lowest concentration of SARS-CoV-2 viral copies this assay can detect is 138 copies/mL. A negative result does not preclude SARS-Cov-2 infection and should not be used as the sole basis for treatment or other patient management decisions. A negative result may occur with  improper specimen collection/handling, submission of specimen other than nasopharyngeal swab, presence of viral mutation(s) within the areas targeted by this  assay, and inadequate number of viral copies(<138 copies/mL). A negative result must be combined with clinical observations, patient history, and epidemiological information. The expected result is Negative.  Fact Sheet for Patients:  EntrepreneurPulse.com.au  Fact Sheet for Healthcare Providers:  IncredibleEmployment.be  This test is no t yet approved or cleared by the [place] FDA and  has been authorized for detection and/or diagnosis of SARS-CoV-2 by FDA under an Emergency Use Authorization (EUA). This EUA will remain  in effect (meaning this test can be used) for the duration of the COVID-19 declaration under Section 564(b)(1) of the Act, 21 U.S.C.section 360bbb-3(b)(1), unless the authorization is terminated  or revoked sooner.       Influenza A by PCR NEGATIVE NEGATIVE Final   Influenza B by PCR NEGATIVE NEGATIVE Final    Comment: (NOTE) The Xpert Xpress SARS-CoV-2/FLU/RSV plus assay is intended as an aid in the diagnosis of influenza from Nasopharyngeal swab specimens and should not be used as a sole basis for treatment. Nasal washings and aspirates are unacceptable for Xpert Xpress SARS-CoV-2/FLU/RSV testing.  Fact Sheet for Patients: EntrepreneurPulse.com.au  Fact Sheet for Healthcare Providers: IncredibleEmployment.be  This test is not yet approved or cleared by the [place] FDA and has been authorized for detection and/or diagnosis of SARS-CoV-2 by FDA under an Emergency Use Authorization (EUA). This EUA will remain in effect (meaning this test can be used) for the duration of the  COVID-19 declaration under Section 564(b)(1) of the Act, 21 U.S.C. section 360bbb-3(b)(1), unless the authorization is terminated or revoked.  Performed at [facility], [place] [address], [place], [place] [address]   Urine Culture     Status: Abnormal (Preliminary result)   Collection Time:  04/18/21  6:08 PM   Specimen: In/Out Cath Urine  Result Value Ref Range Status   Specimen Description IN/OUT CATH URINE  Final   Special Requests NONE  Final   Culture (A)  Final    >=100,000 COLONIES/mL PSEUDOMONAS AERUGINOSA SUSCEPTIBILITIES TO FOLLOW Performed at [facility], [place] [address], [place], [place] [address]    Report Status PENDING  Incomplete  Blood Culture (routine x 2)     Status: None (Preliminary result)   Collection Time: 04/18/21  6:20 PM   Specimen: BLOOD  Result Value Ref Range Status   Specimen Description BLOOD SITE NOT SPECIFIED  Final   Special Requests   Final    BOTTLES DRAWN AEROBIC AND ANAEROBIC Blood Culture results may not be optimal due to an excessive volume of blood received in culture bottles   Culture   Final    NO GROWTH 3 DAYS Performed at [facility], [place] [address] [place], [place] [address]    Report Status PENDING  Incomplete  Blood Culture (routine x 2)     Status: None (Preliminary result)   Collection Time: 04/18/21  9:20 PM   Specimen: BLOOD  Result Value Ref Range Status   Specimen Description BLOOD BLOOD LEFT ARM  Final   Special Requests   Final    BOTTLES DRAWN AEROBIC AND ANAEROBIC Blood Culture adequate volume   Culture   Final    NO GROWTH 3 DAYS Performed at [facility], [place] [address], [place],  [address]    Report Status PENDING  Incomplete    Impression/Plan:  1. AV fistula wound - no signs of infection on clinical exam and no objective findings. He looks well overall.  At this point, will stop antibiotics and can observe off of antibiotics.    2.  ESRD - on intermittent hemodialysis and seen today on dialysis.

## 2021-04-21 NOTE — Progress Notes (Addendum)
°  Progress Note    04/21/2021 7:49 AM * No surgery found *  Subjective: short of breath and feeling some abdominal bloating. Says he had no interest in eating this morning which is rare for him. No major complaints regarding his arm. Denies any pain   Vitals:   04/20/21 2130 04/21/21 0643  BP: (!) 179/60 (!) 190/74  Pulse: (!) 103 97  Resp: 18 18  Temp: 98.7 F (37.1 C) 98.3 F (36.8 C)  SpO2: 95% 95%   Physical Exam: Cardiac:  regular Lungs:  on 2L , non labored Incisions:  right AC with fibrinous exudate on lateral side of wound 1 cm x 2 cm deep medial opening. Probed with cotton tipped applicator. No drainage. Packed wound with iodoform packing, 4x4s, kelix wrap and ACE.   Extremities:  Right AV Fistula with great thrill. 2+ radial pulse Abdomen:  distended, soft, non tender Neurologic: alert and oriented  CBC    Component Value Date/Time   WBC 8.8 04/21/2021 0427   RBC 2.55 (L) 04/21/2021 0427   HGB 7.6 (L) 04/21/2021 0427   HCT 23.4 (L) 04/21/2021 0427   PLT 250 04/21/2021 0427   MCV 91.8 04/21/2021 0427   MCH 29.8 04/21/2021 0427   MCHC 32.5 04/21/2021 0427   RDW 15.8 (H) 04/21/2021 0427   LYMPHSABS 1.7 04/18/2021 1820   MONOABS 1.1 (H) 04/18/2021 1820   EOSABS 0.2 04/18/2021 1820   BASOSABS 0.1 04/18/2021 1820    BMET    Component Value Date/Time   NA 138 04/21/2021 0427   K 3.7 04/21/2021 0427   CL 102 04/21/2021 0427   CO2 28 04/21/2021 0427   GLUCOSE 108 (H) 04/21/2021 0427   BUN 32 (H) 04/21/2021 0427   CREATININE 6.76 (H) 04/21/2021 0427   CALCIUM 7.8 (L) 04/21/2021 0427   GFRNONAA 7 (L) 04/21/2021 0427   GFRAA 52 (L) 07/31/2017 1630    INR    Component Value Date/Time   INR 1.1 04/18/2021 1820     Intake/Output Summary (Last 24 hours) at 04/21/2021 0749 Last data filed at 04/21/2021 0600 Gross per 24 hour  Intake 1380 ml  Output 0 ml  Net 1380 ml     Assessment/Plan:  87 y.o. male admitted with concerns of infected right AV  fistula. Dehiscence of right AC incision for brachiocephalic AV fistula. Dressings changed this morning. Well appearing. No drainage. No erythema or signs of infection. Iodoform packing reapplied Afebrile. No leukocytosis Remains on Vancomycin  Will continue to follow. Presently no need for surgical intervention  Marval Regal [facility] [phone] 04/21/2021 7:49 AM  VASCULAR STAFF ADDENDUM: I have independently interviewed and examined the patient. I agree with the above.  Continue local care to the arm.  Wrap from fingers to mid arm to avoid hand swelling. Will try to manage this non-operatively with local wound care alone.   [name]. Stanford Breed, MD [facility] Phone Number: [phone] 04/21/2021 10:55 AM

## 2021-04-21 NOTE — Progress Notes (Signed)
[facility] Progress Note   Assessment/ Plan:   Dialysis Orders:  MWF [facility] 3h 300/500 83kg 2K 2.25Ca  TDC , RU AVF maturing Hep 3000  mircera 50q2wks (not yet given)    Assessment/Plan:  Infected RUE wound - wound culture and blood culture taken at OP dialysis center but have not yet resulted. BC collected here as well.  Given initial dose of Vanc/Fortaz on 2/24. Wound packed by VVS with daily dressing changes.  May need to go to OR for washout if not improved but per VVS looks like its improving.  ID consulted, CT w/contrast ordered.  Remain on vanc/ cefepime with dialysis for now.  ESRD -  on HD MWF.  on schedule today.    Hypertension/volume  - BP elevated, lots of edema.  Increasing UF goal for today- probe for EDW, will likely need new on d/c.  Acute on chronic Anemia of CKD - recent admit for GIB.  Hgb drop since admit 7.9>6.6, now improved to 8.1 s/p 1 unit pRBC.  ESA with HD  Transfuse prn.  Secondary Hyperparathyroidism -  Corrected Ca in goal.  Not on VDRA or binders.  Check phos. .     Nutrition - Alb 1.8 - start protein supplements.  Renal diet w/fluid restrictions.  Hx CVA Hx COPD on 2L O2 at baseline Dispo: inpatient  Subjective:    Seen and examined on dialysis.  The treatment was supervised.  I have made appropriate changes.  BRF 400 mL/ min via R IJ TDC.  UF goal 2L--> 3L--> pt reporting SOB and has BP of 180s-190s.  Some R UE pain.     Objective:   BP (!) 190/74 (BP Location: Left Arm)    Pulse 97    Temp 98.3 F (36.8 C) (Oral)    Resp 18    Ht 6\' 2"  (1.88 m)    Wt 82 kg    SpO2 95%    BMI 23.21 kg/m   Physical Exam: Gen:NAD, sitting in bed CVS: RRR Resp: bilateral crackles Abd: distended with + fluid wave Ext: 2+ LE edema and flank edema ACCESS: R IJ TDC, R UE AVF in Acewrap- pictures of dehiscence visualized  Labs: BMET Recent Labs  Lab 04/18/21 1820 04/19/21 0619 04/20/21 0341 04/21/21 0427  NA 137 138 138 138  K 2.9* 3.6 3.4* 3.7   CL 97* 100 101 102  CO2 30 27 27 28   GLUCOSE 116* 91 95 108*  BUN 11 15 22  32*  CREATININE 3.28* 4.04* 5.18* 6.76*  CALCIUM 8.2* 7.8* 7.9* 7.8*   CBC Recent Labs  Lab 04/18/21 1820 04/19/21 0619 04/19/21 0736 04/20/21 0341 04/21/21 0427  WBC 12.4* 9.2  --  8.1 8.8  NEUTROABS 9.3*  --   --   --   --   HGB 7.9* 6.6* 7.0* 8.1* 7.6*  HCT 25.2* 20.8* 21.5* 24.4* 23.4*  MCV 92.6 92.4  --  90.7 91.8  PLT 350 254  --  253 250      Medications:     (feeding supplement) PROSource Plus  30 mL Oral BID BM   Chlorhexidine Gluconate Cloth  6 each Topical Daily   Chlorhexidine Gluconate Cloth  6 each Topical Q0600   diltiazem  180 mg Oral Daily   feeding supplement (NEPRO CARB STEADY)  237 mL Oral TID AC   ipratropium-albuterol  3 mL Nebulization QID   lanthanum  1,000 mg Oral TID WC   levothyroxine  50 mcg Oral QAC  breakfast   pantoprazole  40 mg Oral BID   pravastatin  40 mg Oral QHS   sodium chloride flush  3 mL Intravenous Q12H     Madelon Lips MD 04/21/2021, 8:57 AM

## 2021-04-21 NOTE — Progress Notes (Signed)
PROGRESS NOTE    Wayne Meadows  TIW:[id number] DOB: [birth year] DOA: 04/18/2021 PCP: [name]   Brief Narrative:  Wayne Meadows is a 87 y.o. male with medical history significant of colitis, renal cell cancer, recurrent cellulitis, paroxysmal A-fib, hyperlipidemia, hypertension, CAD, hypothyroidism, ESRD on HD, gastric ulcer, GI bleed, COPD, anxiety, CVA presenting with persistent arm/fistula infection. **UPDATE - patient was NOT on antibiotics - these were started at intake. Presented for worsening pain and infection at fistula site.  Assessment & Plan:   Principal Problem:   Arteriovenous fistula infection ([place]) Active Problems:   Atrial fibrillation (HCC)   COPD (chronic obstructive pulmonary disease) (HCC)   Essential hypertension   Hypothyroidism   History of CVA (cerebrovascular accident)   Other hyperlipidemia   Chronic pancreatitis (HCC)   ESRD (end stage renal disease) on dialysis ([place])   Sepsis secondary to AV fistula infection, POA Questionable UTI, POA, Resolved > Patient was not previously on outpatient antibiotics (this was reported at intake but is inaccurate -received a single dose of vancomycin/ceftazidime and dialysis prior to admit) -Discontinue antibiotics 04/21/2021 per discussion with infectious disease, follow clinically -imaging discontinued - Vascular surgery/nephrology/ID consulted - Cultures pending, afebrile since admission -Urine culture taken at admission shows Proteus, patient has completed 3 days of ceftazidime/cefepime at this point   Acute symptomatic anemia, unclear source Rule out acute blood loss anemia on chronic anemia of chronic disease with concurrent chronic iron deficiency anemia -Recently admitted for GI bleed, reports ongoing black stool on occasion -Continue transfusion hemoglobin less than 7 or symptomatic, blood ideally transfused with dialysis to help moderate volume status -1 unit PRBC ordered  -Repeat H&H  today stable -Continue to hold any anticoagulation -discontinue aspirin  ESRD Hypokalemia > On HD Monday Wednesday Friday > Concern for fistula infection as above -Nephrology following   Hypertension - Continue home diltiazem, lisinopril -currently well controlled  Hyperlipidemia CAD Hx CVA - Continue home pravastatin, aspirin  COPD - Continue home Combivent and as needed albuterol Chronic pancreatitis - Stable, continue to monitor   DVT prophylaxis:  SCDs Code Status:  Full Family Communication: None available  Status is: Inpatient  Dispo: The patient is from: Facility              Anticipated d/c is to: Same              Anticipated d/c date is: 48 to 72 hours              Patient currently not medically stable for discharge  Consultants:  Nephrology, vascular surgery  Procedures:  None  Antimicrobials:  Vancomycin, cefepime  Subjective: No acute issues or events overnight, denies nausea vomiting diarrhea constipation headache fevers chills or chest pain.  Ongoing chronic low back pain and right upper extremity pain around previous fistula surgical site well controlled on current regimen.  Objective: Vitals:   04/20/21 1700 04/20/21 2049 04/20/21 2130 04/21/21 0643  BP: (!) 149/53  (!) 179/60 (!) 190/74  Pulse: 85  (!) 103 97  Resp: 17  18 18   Temp: 98.2 F (36.8 C)  98.7 F (37.1 C) 98.3 F (36.8 C)  TempSrc:   Oral Oral  SpO2: 95% 99% 95% 95%  Weight:      Height:        Intake/Output Summary (Last 24 hours) at 04/21/2021 0725 Last data filed at 04/21/2021 0600 Gross per 24 hour  Intake 1380 ml  Output 0 ml  Net 1380 ml  Filed Weights   04/18/21 2304 04/19/21 0500 04/19/21 2027  Weight: 92.5 kg 81.7 kg 82 kg    Examination:  General:  Pleasantly resting in bed, No acute distress. HEENT:  Normocephalic atraumatic.  Sclerae nonicteric, noninjected.  Extraocular movements intact bilaterally. Neck:  Without mass or deformity.  Trachea is  midline. Lungs:  Clear to auscultate bilaterally without rhonchi, wheeze, or rales. Heart:  Regular rate and rhythm.  Without murmurs, rubs, or gallops. Abdomen:  Soft, nontender, nondistended.  Without guarding or rebound. Extremities: Right upper extremity fistula bandage with clear serous drainage  Data Reviewed: I have personally reviewed following labs and imaging studies  CBC: Recent Labs  Lab 04/18/21 1820 04/19/21 0619 04/19/21 0736 04/20/21 0341 04/21/21 0427  WBC 12.4* 9.2  --  8.1 8.8  NEUTROABS 9.3*  --   --   --   --   HGB 7.9* 6.6* 7.0* 8.1* 7.6*  HCT 25.2* 20.8* 21.5* 24.4* 23.4*  MCV 92.6 92.4  --  90.7 91.8  PLT 350 254  --  253 376    Basic Metabolic Panel: Recent Labs  Lab 04/18/21 1820 04/18/21 2120 04/19/21 0619 04/20/21 0341 04/21/21 0427  NA 137  --  138 138 138  K 2.9*  --  3.6 3.4* 3.7  CL 97*  --  100 101 102  CO2 30  --  27 27 28   GLUCOSE 116*  --  91 95 108*  BUN 11  --  15 22 32*  CREATININE 3.28*  --  4.04* 5.18* 6.76*  CALCIUM 8.2*  --  7.8* 7.9* 7.8*  MG  --  1.5*  --   --   --     GFR: Estimated Creatinine Clearance: 9.4 mL/min (A) (by C-G formula based on SCr of 6.76 mg/dL (H)). Liver Function Tests: Recent Labs  Lab 04/18/21 1820 04/19/21 0619  AST 18 13*  ALT 7 6  ALKPHOS 84 65  BILITOT 0.3 0.2*  PROT 6.0* 5.1*  ALBUMIN 2.2* 1.8*    No results for input(s): LIPASE, AMYLASE in the last 168 hours. No results for input(s): AMMONIA in the last 168 hours. Coagulation Profile: Recent Labs  Lab 04/18/21 1820  INR 1.1    Cardiac Enzymes: No results for input(s): CKTOTAL, CKMB, CKMBINDEX, TROPONINI in the last 168 hours. BNP (last 3 results) No results for input(s): PROBNP in the last 8760 hours. HbA1C: No results for input(s): HGBA1C in the last 72 hours. CBG: No results for input(s): GLUCAP in the last 168 hours. Lipid Profile: No results for input(s): CHOL, HDL, LDLCALC, TRIG, CHOLHDL, LDLDIRECT in the last 72  hours. Thyroid Function Tests: No results for input(s): TSH, T4TOTAL, FREET4, T3FREE, THYROIDAB in the last 72 hours. Anemia Panel: No results for input(s): VITAMINB12, FOLATE, FERRITIN, TIBC, IRON, RETICCTPCT in the last 72 hours. Sepsis Labs: Recent Labs  Lab 04/18/21 1820 04/18/21 2120  LATICACIDVEN 2.0* 1.2     Recent Results (from the past 240 hour(s))  Resp Panel by RT-PCR (Flu A&B, Covid) Nasopharyngeal Swab     Status: None   Collection Time: 04/18/21  6:08 PM   Specimen: Nasopharyngeal Swab; Nasopharyngeal(NP) swabs in vial transport medium  Result Value Ref Range Status   SARS Coronavirus 2 by RT PCR NEGATIVE NEGATIVE Final    Comment: (NOTE) SARS-CoV-2 target nucleic acids are NOT DETECTED.  The SARS-CoV-2 RNA is generally detectable in upper respiratory specimens during the acute phase of infection. The lowest concentration of SARS-CoV-2 viral copies this  assay can detect is 138 copies/mL. A negative result does not preclude SARS-Cov-2 infection and should not be used as the sole basis for treatment or other patient management decisions. A negative result may occur with  improper specimen collection/handling, submission of specimen other than nasopharyngeal swab, presence of viral mutation(s) within the areas targeted by this assay, and inadequate number of viral copies(<138 copies/mL). A negative result must be combined with clinical observations, patient history, and epidemiological information. The expected result is Negative.  Fact Sheet for Patients:  EntrepreneurPulse.com.au  Fact Sheet for Healthcare Providers:  IncredibleEmployment.be  This test is no t yet approved or cleared by the [place] FDA and  has been authorized for detection and/or diagnosis of SARS-CoV-2 by FDA under an Emergency Use Authorization (EUA). This EUA will remain  in effect (meaning this test can be used) for the duration of the COVID-19  declaration under Section 564(b)(1) of the Act, 21 U.S.C.section 360bbb-3(b)(1), unless the authorization is terminated  or revoked sooner.       Influenza A by PCR NEGATIVE NEGATIVE Final   Influenza B by PCR NEGATIVE NEGATIVE Final    Comment: (NOTE) The Xpert Xpress SARS-CoV-2/FLU/RSV plus assay is intended as an aid in the diagnosis of influenza from Nasopharyngeal swab specimens and should not be used as a sole basis for treatment. Nasal washings and aspirates are unacceptable for Xpert Xpress SARS-CoV-2/FLU/RSV testing.  Fact Sheet for Patients: EntrepreneurPulse.com.au  Fact Sheet for Healthcare Providers: IncredibleEmployment.be  This test is not yet approved or cleared by the [place] FDA and has been authorized for detection and/or diagnosis of SARS-CoV-2 by FDA under an Emergency Use Authorization (EUA). This EUA will remain in effect (meaning this test can be used) for the duration of the COVID-19 declaration under Section 564(b)(1) of the Act, 21 U.S.C. section 360bbb-3(b)(1), unless the authorization is terminated or revoked.  Performed at [facility], [place] [address], [place], [place] [address]   Urine Culture     Status: Abnormal (Preliminary result)   Collection Time: 04/18/21  6:08 PM   Specimen: In/Out Cath Urine  Result Value Ref Range Status   Specimen Description IN/OUT CATH URINE  Final   Special Requests NONE  Final   Culture (A)  Final    >=100,000 COLONIES/mL PSEUDOMONAS AERUGINOSA SUSCEPTIBILITIES TO FOLLOW Performed at [facility], [place] [address] [place], [place] [address]    Report Status PENDING  Incomplete  Blood Culture (routine x 2)     Status: None (Preliminary result)   Collection Time: 04/18/21  6:20 PM   Specimen: BLOOD  Result Value Ref Range Status   Specimen Description BLOOD SITE NOT SPECIFIED  Final   Special Requests   Final    BOTTLES DRAWN AEROBIC AND ANAEROBIC Blood  Culture results may not be optimal due to an excessive volume of blood received in culture bottles   Culture   Final    NO GROWTH 2 DAYS Performed at [facility], [place] [address], [place], [place] [address]    Report Status PENDING  Incomplete  Blood Culture (routine x 2)     Status: None (Preliminary result)   Collection Time: 04/18/21  9:20 PM   Specimen: BLOOD  Result Value Ref Range Status   Specimen Description BLOOD BLOOD LEFT ARM  Final   Special Requests   Final    BOTTLES DRAWN AEROBIC AND ANAEROBIC Blood Culture adequate volume   Culture   Final  NO GROWTH 2 DAYS Performed at [facility], [place] [address], [place], [place] [address]    Report Status PENDING  Incomplete     Radiology Studies: No results found.  Scheduled Meds:  (feeding supplement) PROSource Plus  30 mL Oral BID BM   Chlorhexidine Gluconate Cloth  6 each Topical Daily   Chlorhexidine Gluconate Cloth  6 each Topical Q0600   diltiazem  180 mg Oral Daily   feeding supplement (NEPRO CARB STEADY)  237 mL Oral TID AC   ipratropium-albuterol  3 mL Nebulization QID   lanthanum  1,000 mg Oral TID WC   levothyroxine  50 mcg Oral QAC breakfast   pantoprazole  40 mg Oral BID   pravastatin  40 mg Oral QHS   sodium chloride flush  3 mL Intravenous Q12H   Continuous Infusions:  ceFEPime (MAXIPIME) IV 1 g (04/20/21 1719)   vancomycin       LOS: 2 days   Time spent: 23min  Wayne C Jamiere Gulas, DO [facility]  If 7PM-7AM, please contact night-coverage [email or web address]  04/21/2021, 7:25 AM

## 2021-04-21 NOTE — Progress Notes (Signed)
Pt receives out-pt HD at [facility] on MWF. Pt arrives at 12:40 for 1:00 chair time. Pt admitted from snf. Will assist as needed.   Melven Sartorius Renal Navigator [phone]

## 2021-04-21 NOTE — Consult Note (Addendum)
[facility] Nurse Consult Note: Reason for Consult: Consult requested for bilat legs. Pt is familiar to [facility] team from previous admissions. He was wearing bilat Una boots prior to admission and states they are changed twice a week.  These were previously removed upon admission.  Wound type: Bilat legs with scattered areas of red moist partial thickness wounds to anterior and posterior calves, small amt yellow drainage.  Dry scabs in patchy areas, no edema.  Dressing procedure/placement/frequency: Continue present plan of care: notified ortho tech of need to apply bilat Una boots and change Q Mon and Thurs.  Please re-consult if further assistance is needed.  Thank-you,  Julien Girt MSN, [place], [place], [place], [place]

## 2021-04-21 NOTE — Progress Notes (Signed)
Orthopedic Tech Progress Note Patient Details:  Wayne Meadows 09-Apr-1934 [id number]  Ortho Devices Type of Ortho Device: Haematologist Ortho Device/Splint Location: BLE Ortho Device/Splint Interventions: Ordered, Application, Adjustment   Post Interventions Patient Tolerated: Well Instructions Provided: Care of device  Janit Pagan 04/21/2021, 5:24 PM

## 2021-04-22 DIAGNOSIS — N186 End stage renal disease: Secondary | ICD-10-CM | POA: Diagnosis not present

## 2021-04-22 DIAGNOSIS — T827XXS Infection and inflammatory reaction due to other cardiac and vascular devices, implants and grafts, sequela: Secondary | ICD-10-CM | POA: Diagnosis not present

## 2021-04-22 DIAGNOSIS — J449 Chronic obstructive pulmonary disease, unspecified: Secondary | ICD-10-CM | POA: Diagnosis not present

## 2021-04-22 DIAGNOSIS — I129 Hypertensive chronic kidney disease with stage 1 through stage 4 chronic kidney disease, or unspecified chronic kidney disease: Secondary | ICD-10-CM | POA: Diagnosis not present

## 2021-04-22 DIAGNOSIS — T8130XA Disruption of wound, unspecified, initial encounter: Secondary | ICD-10-CM | POA: Diagnosis not present

## 2021-04-22 DIAGNOSIS — I4891 Unspecified atrial fibrillation: Secondary | ICD-10-CM | POA: Diagnosis not present

## 2021-04-22 DIAGNOSIS — Z992 Dependence on renal dialysis: Secondary | ICD-10-CM | POA: Diagnosis not present

## 2021-04-22 LAB — BASIC METABOLIC PANEL
Anion gap: 9 (ref 5–15)
BUN: 18 mg/dL (ref 8–23)
CO2: 28 mmol/L (ref 22–32)
Calcium: 8.2 mg/dL — ABNORMAL LOW (ref 8.9–10.3)
Chloride: 98 mmol/L (ref 98–111)
Creatinine, Ser: 4.64 mg/dL — ABNORMAL HIGH (ref 0.61–1.24)
GFR, Estimated: 12 mL/min — ABNORMAL LOW (ref >60)
Glucose, Bld: 91 mg/dL (ref 70–99)
Potassium: 4.2 mmol/L (ref 3.5–5.1)
Sodium: 135 mmol/L (ref 135–145)

## 2021-04-22 LAB — CBC
HCT: 26.1 % — ABNORMAL LOW (ref 39.0–52.0)
Hemoglobin: 8.6 g/dL — ABNORMAL LOW (ref 13.0–17.0)
MCH: 30.2 pg (ref 26.0–34.0)
MCHC: 33 g/dL (ref 30.0–36.0)
MCV: 91.6 fL (ref 80.0–100.0)
Platelets: 287 10*3/uL (ref 150–400)
RBC: 2.85 MIL/uL — ABNORMAL LOW (ref 4.22–5.81)
RDW: 15.7 % — ABNORMAL HIGH (ref 11.5–15.5)
WBC: 8.8 10*3/uL (ref 4.0–10.5)
nRBC: 0 % (ref 0.0–0.2)

## 2021-04-22 MED ORDER — ALTEPLASE 2 MG IJ SOLR: 2.0000 mg | Freq: Once | INTRAMUSCULAR | Status: DC | PRN

## 2021-04-22 MED ORDER — LIDOCAINE HCL (PF) 1 % IJ SOLN: 5.0000 mL | INTRAMUSCULAR | Status: DC | PRN

## 2021-04-22 MED ORDER — HEPARIN SODIUM (PORCINE) 1000 UNIT/ML DIALYSIS
1000.0000 [IU] | INTRAMUSCULAR | Status: DC | PRN
  Administered 2021-04-23: 1000 [IU] via INTRAVENOUS_CENTRAL
  Filled 2021-04-22 (×2): qty 1

## 2021-04-22 MED ORDER — LIDOCAINE-PRILOCAINE 2.5-2.5 % EX CREA: 1.0000 "application " | TOPICAL_CREAM | CUTANEOUS | Status: DC | PRN

## 2021-04-22 MED ORDER — CHLORHEXIDINE GLUCONATE CLOTH 2 % EX PADS
6.0000 | MEDICATED_PAD | Freq: Every day | CUTANEOUS | Status: DC
  Administered 2021-04-23 – 2021-04-24 (×2): 6 via TOPICAL

## 2021-04-22 MED ORDER — PENTAFLUOROPROP-TETRAFLUOROETH EX AERO: 1.0000 "application " | INHALATION_SPRAY | CUTANEOUS | Status: DC | PRN

## 2021-04-22 MED ORDER — SODIUM CHLORIDE 0.9 % IV SOLN: 100.0000 mL | INTRAVENOUS | Status: DC | PRN

## 2021-04-22 NOTE — Progress Notes (Addendum)
[facility] Progress Note   Subjective:    Seen and examined patient at bedside. He reports not sleeping very well overnight but otherwise no acute complaints. Denies SOB, CP, and N/V. He tolerated yesterday's HD with net UF 3L. Plan for HD 04/23/21 if patient is still here.  Objective Vitals:   04/21/21 2055 04/22/21 0439 04/22/21 0848 04/22/21 0930  BP: (!) 158/63 (!) 144/78  (!) 165/48  Pulse: 71 68  85  Resp: 18 18  18   Temp: 98.9 F (37.2 C) 98.2 F (36.8 C)  99.4 F (37.4 C)  TempSrc: Oral   Oral  SpO2: 98% 96% 97% 96%  Weight:  81.3 kg    Height:       Physical Exam General: NAD; Sitting up in bed Heart: S1 and S2; No murmurs, gallops, or rubs Lungs: mild congestion with expiratory wheezing bilaterally Abdomen: round and distended Extremities: no edema BLLE; noted ace-wrap at LEs Dialysis Access: R IJ TDC; RUE AVF in ace-wrap   [facility] Weights   04/21/21 0832 04/21/21 1228 04/22/21 0439  Weight: 83.7 kg 80.7 kg 81.3 kg    Intake/Output Summary (Last 24 hours) at 04/22/2021 1236 Last data filed at 04/22/2021 0900 Gross per 24 hour  Intake 537 ml  Output 0 ml  Net 537 ml    Additional Objective Labs: Basic Metabolic Panel: Recent Labs  Lab 04/20/21 0341 04/21/21 0427 04/22/21 0414  NA 138 138 135  K 3.4* 3.7 4.2  CL 101 102 98  CO2 27 28 28   GLUCOSE 95 108* 91  BUN 22 32* 18  CREATININE 5.18* 6.76* 4.64*  CALCIUM 7.9* 7.8* 8.2*   Liver Function Tests: Recent Labs  Lab 04/18/21 1820 04/19/21 0619  AST 18 13*  ALT 7 6  ALKPHOS 84 65  BILITOT 0.3 0.2*  PROT 6.0* 5.1*  ALBUMIN 2.2* 1.8*   No results for input(s): LIPASE, AMYLASE in the last 168 hours. CBC: Recent Labs  Lab 04/18/21 1820 04/19/21 0619 04/19/21 0736 04/20/21 0341 04/21/21 0427 04/22/21 0414  WBC 12.4* 9.2  --  8.1 8.8 8.8  NEUTROABS 9.3*  --   --   --   --   --   HGB 7.9* 6.6*   < > 8.1* 7.6* 8.6*  HCT 25.2* 20.8*   < > 24.4* 23.4* 26.1*  MCV 92.6 92.4  --   90.7 91.8 91.6  PLT 350 254  --  253 250 287   < > = values in this interval not displayed.   Blood Culture    Component Value Date/Time   SDES BLOOD BLOOD LEFT ARM 04/18/2021 2120   SPECREQUEST  04/18/2021 2120    BOTTLES DRAWN AEROBIC AND ANAEROBIC Blood Culture adequate volume   CULT  04/18/2021 2120    NO GROWTH 4 DAYS Performed at [facility], [place] [address] [place], [place] [address]    REPTSTATUS PENDING 04/18/2021 2120    Cardiac Enzymes: No results for input(s): CKTOTAL, CKMB, CKMBINDEX, TROPONINI in the last 168 hours. CBG: No results for input(s): GLUCAP in the last 168 hours. Iron Studies: No results for input(s): IRON, TIBC, TRANSFERRIN, FERRITIN in the last 72 hours. Lab Results  Component Value Date   INR 1.1 04/18/2021   INR 1.2 04/09/2021   INR 1.3 (H) 04/08/2021   Studies/Results: No results found.  Medications:   (feeding supplement) PROSource Plus  30 mL Oral BID BM   Chlorhexidine Gluconate Cloth  6 each Topical Daily  Chlorhexidine Gluconate Cloth  6 each Topical Q0600   darbepoetin (ARANESP) injection - DIALYSIS  60 mcg Intravenous Q Mon-HD   diltiazem  180 mg Oral Daily   feeding supplement (NEPRO CARB STEADY)  237 mL Oral TID AC   ipratropium-albuterol  3 mL Nebulization BID   lanthanum  1,000 mg Oral TID WC   levothyroxine  50 mcg Oral QAC breakfast   pantoprazole  40 mg Oral BID   pravastatin  40 mg Oral QHS   sodium chloride flush  3 mL Intravenous Q12H    Dialysis Orders: MWF [facility] 3h 300/500 83kg 2K 2.25Ca  TDC , RU AVF maturing Hep 3000  mircera 50q2wks (not yet given)   Assessment/Plan: Infected RUE wound - wound culture and blood culture taken at OP dialysis center but have not yet resulted. BC collected here as well-from 2/24 (-) X 4 days. ABX d/c'd 2/27. Pt completed 3 days ceftazidime/cefepime. Wound packed by VVS with daily dressing changes.  May need to go to OR for washout if not improved but per VVS looks like its  improving.  ID consulted, CT w/contrast ordered.  ESRD -  on HD MWF. Plan for HD 3/1 if patient is still here. Hypertension/volume  - BP elevated, lots of edema.  Increasing UF goal for today- probe for EDW, will likely need new on d/c. Acute on chronic Anemia of CKD - recent admit for GIB.  Hgb drop since admit 7.9>6.6, now improved to 8.6 s/p 1 unit pRBC.  ESA with HD  Transfuse prn. Secondary Hyperparathyroidism -  Corrected Ca in goal.  Not on VDRA or binders.  Check phos. .    Nutrition - Alb 1.8 - on protein supplements.  Renal diet w/fluid restrictions.  Hx CVA Hx COPD on 2L O2 at baseline Dispo: Reviewed last VVS note: AVF dressing changed this AM, no evidence for infection. Okay for discharge from VVS standpoint. Also okay for discharge from renal standpoint. Can resume HD 3/1 in outpatient but will place HD orders in case patient is still here.  Tobie Poet, NP [facility] 04/22/2021,12:36 PM  LOS: 3 days

## 2021-04-22 NOTE — Progress Notes (Unsigned)
Office Visit    Patient Name: Wayne Meadows Date of Encounter: 04/22/2021  PCP:  Clinic, [facility]  Cardiologist:  Sherren Mocha, MD  Advanced Practice Provider:  No care team member to display Electrophysiologist:  None   HPI    Wayne Meadows is a 87 y.o. male with a hx of CAD status post BMS to RCA and stent in circumflex, carotid stenosis, COPD, CVA, hypertension, remote tobacco abuse presents today for follow-up appointment.  Cardiac cath in 2013 showed that stents were patent and that he had a 50% LAD and first small diagonal that was occluded.  Repeat cardiac catheterization May 2022 for chest pain showed stable three-vessel disease with widely patent stents in the ramus intermedius, moderate 40 to 50% in-stent restenosis in the proximal RCA BMS, focal 50% mid LAD just prior to the bend, occluded first diagonal.  Patient was discharged from the hospital 08/15/2020 after hospitalization with acute on chronic pancreatitis which responded well to bowel rest and fluids.  Patient was last seen June 2022 after discharge from the hospital and had not been eating much at that point and not getting regular exercise.  He did have some shortness of breath with activity which is chronic.  No chest pain.  He did have some leg weakness and leg edema which improved with compression hose.  Today, he ***  Past Medical History    Past Medical History:  Diagnosis Date   Abnormal echocardiogram 03/08/2021   Abnormal finding on GI tract imaging    Acute blood loss anemia    Acute pancreatitis 08/12/2020   Arthritis    Carotid stenosis    Community acquired pneumonia 04/06/2021   COPD (chronic obstructive pulmonary disease) (HCC)    Coronary artery disease    S/p PCI 2011;  NSTEMI 12/12:  LHC/PCI 02/23/11: LAD 60% after the septal perforator, D1 occluded with distal collaterals, proximal RI 30-40%, AV circumflex stent patent with 60% stenosis after  the stent, RCA 99%, EF 60-65%.  His RCA was treated with a bare-metal stent   CVA (cerebral infarction) 2011   Right cerebral; total obstruction of the right ICA   Diverticulitis    Hypertension    Paroxysmal atrial fibrillation with RVR ([place]) 04/09/2021   Pleuritic chest pain 04/08/2014   Pressure injury of skin 03/30/2021   Rash 03/05/2021   Rectal bleeding 07/2015   Refractory nausea and vomiting 06/20/2020   Renal carcinoma ([place])    Sepsis, unspecified organism ([place]) 04/11/2016   Stroke ([place])    Tobacco abuse, in remission    Past Surgical History:  Procedure Laterality Date   AV FISTULA PLACEMENT Right 04/03/2021   Procedure: ARTERIOVENOUS (AV) CREATION OF RIGHT ARM BRACHIOCEPHALIC FISTULA;  Surgeon: Cherre Robins, MD;  Location: [facility];  Service: Vascular;  Laterality: Right;   BIOPSY  04/08/2021   Procedure: BIOPSY;  Surgeon: Lavena Bullion, DO;  Location: [facility];  Service: Gastroenterology;;   COLON SURGERY     COLONOSCOPY N/A 08/22/2015   Procedure: COLONOSCOPY;  Surgeon: [name], MD;  Location: [facility];  Service: Endoscopy;  Laterality: N/A;   ESOPHAGOGASTRODUODENOSCOPY N/A 07/18/2020   Procedure: ESOPHAGOGASTRODUODENOSCOPY (EGD);  Surgeon: Milus Banister, MD;  Location: [facility];  Service: Endoscopy;  Laterality: N/A;   ESOPHAGOGASTRODUODENOSCOPY (EGD) WITH PROPOFOL N/A 06/21/2020   Procedure: ESOPHAGOGASTRODUODENOSCOPY (EGD) WITH PROPOFOL;  Surgeon: Irene Shipper, MD;  Location: [facility] ENDOSCOPY;  Service: Endoscopy;  Laterality: N/A;  ESOPHAGOGASTRODUODENOSCOPY (EGD) WITH PROPOFOL N/A 04/08/2021   Procedure: ESOPHAGOGASTRODUODENOSCOPY (EGD) WITH PROPOFOL;  Surgeon: Lavena Bullion, DO;  Location: [facility];  Service: Gastroenterology;  Laterality: N/A;   EUS N/A 07/18/2020   Procedure: UPPER ENDOSCOPIC ULTRASOUND (EUS) RADIAL;  Surgeon: Milus Banister, MD;  Location: [facility];  Service: Endoscopy;  Laterality: N/A;   FINE NEEDLE ASPIRATION N/A  07/18/2020   Procedure: FINE NEEDLE ASPIRATION (FNA) LINEAR;  Surgeon: Milus Banister, MD;  Location: [facility];  Service: Endoscopy;  Laterality: N/A;   HEMOSTASIS CLIP PLACEMENT  04/08/2021   Procedure: HEMOSTASIS CLIP PLACEMENT;  Surgeon: Lavena Bullion, DO;  Location: [facility];  Service: Gastroenterology;;   hip relacement     IR FLUORO GUIDE CV LINE RIGHT  03/31/2021   IR US GUIDE VASC ACCESS RIGHT  03/31/2021   KIDNEY SURGERY     LEFT HEART CATH AND CORONARY ANGIOGRAPHY N/A 07/09/2020   Procedure: LEFT HEART CATH AND CORONARY ANGIOGRAPHY;  Surgeon: Leonie Man, MD;  Location: [facility];  Service: Cardiovascular;  Laterality: N/A;   LEFT HEART CATHETERIZATION WITH CORONARY ANGIOGRAM N/A 02/23/2011   Procedure: LEFT HEART CATHETERIZATION WITH CORONARY ANGIOGRAM;  Surgeon: [name], MD;  Location: [facility] CATH LAB;  Service: Cardiovascular;  Laterality: N/A;   LEFT HEART CATHETERIZATION WITH CORONARY ANGIOGRAM N/A 03/18/2011   Procedure: LEFT HEART CATHETERIZATION WITH CORONARY ANGIOGRAM;  Surgeon: [name], MD;  Location: [facility] CATH LAB;  Service: Cardiovascular;  Laterality: N/A;   PERCUTANEOUS CORONARY STENT INTERVENTION (PCI-S) N/A 02/23/2011   Procedure: PERCUTANEOUS CORONARY STENT INTERVENTION (PCI-S);  Surgeon: [name], MD;  Location: [facility] CATH LAB;  Service: Cardiovascular;  Laterality: N/A;   TEMPORARY PACEMAKER INSERTION N/A 02/23/2011   Procedure: TEMPORARY PACEMAKER INSERTION;  Surgeon: [name], MD;  Location: [facility] CATH LAB;  Service: Cardiovascular;  Laterality: N/A;   THROMBECTOMY W/ EMBOLECTOMY Right 04/03/2021   Procedure: THROMBECTOMY ARTERIOVENOUS FISTULA;  Surgeon: Cherre Robins, MD;  Location: [facility];  Service: Vascular;  Laterality: Right;    Allergies  Allergies  Allergen Reactions   Bee Venom Anaphylaxis    Has epi pen   Influenza Vaccines Other (See Comments)    "Mortally sick for 2 weeks"   EKGs/Labs/Other Studies Reviewed:    The following studies were reviewed today:  Left heart cath 07/09/20:   LV end diastolic pressure is moderately elevated. 17 mmHg ------------------------ RCA is a large-caliber, dominant vessel Prox RCA to Mid RCA bare-metal stent is 45% stenosed. --- RPDA lesion is 60% stenosed. Mid LAD lesion is 50% stenosed. Previously noted, mild diffuse disease otherwise noted. ----1st Diag lesion is 100% stenosed. Known Previously placed Ramus stent (unknown type) is widely patent. Remainder of the ramus intermedius is free of disease. LCx is a relatively small caliber, nondominant vessel with 1 small OM branch in the AV groove branch. Most of the LCx territory is covered by the Ramus Intermedius.  EKG:  EKG is *** ordered today.  The ekg ordered today demonstrates ***  Recent Labs: 03/17/2021: B Natriuretic Peptide 280.9 04/18/2021: Magnesium 1.5 04/19/2021: ALT 6 04/22/2021: BUN 18; Creatinine, Ser 4.64; Hemoglobin 8.6; Platelets 287; Potassium 4.2; Sodium 135  Recent Lipid Panel    Component Value Date/Time   CHOL 65 08/12/2020 0400   TRIG 74 08/12/2020 0400   HDL 27 (L) 08/12/2020 0400   CHOLHDL 2.4 08/12/2020 0400   VLDL 15 08/12/2020 0400   LDLCALC 23 08/12/2020 0400    Risk Assessment/Calculations:  {  Does this patient have ATRIAL FIBRILLATION?:[phone]}  Home Medications   No outpatient medications have been marked as taking for the 04/28/21 encounter (Appointment) with Elgie Collard, PA-C.     Review of Systems   ***   All other systems reviewed and are otherwise negative except as noted above.  Physical Exam    VS:  There were no vitals taken for this visit. , BMI There is no height or weight on file to calculate BMI.  Wt Readings from Last 3 Encounters:  04/22/21 179 lb 3.7 oz (81.3 kg)  04/11/21 181 lb (82.1 kg)  04/04/21 197 lb 12 oz (89.7 kg)     GEN: Well nourished, well developed, in no acute distress. HEENT: normal. Neck: Supple, no JVD, carotid bruits,  or masses. Cardiac: ***RRR, no murmurs, rubs, or gallops. No clubbing, cyanosis, edema.  ***Radials/PT 2+ and equal bilaterally.  Respiratory:  ***Respirations regular and unlabored, clear to auscultation bilaterally. GI: Soft, nontender, nondistended. MS: No deformity or atrophy. Skin: Warm and dry, no rash. Neuro:  Strength and sensation are intact. Psych: Normal affect.  Assessment & Plan    Stable three-vessel CAD  Severe hypertension  History of CVA      Disposition: Follow up {follow up:15908} with Sherren Mocha, MD or APP.  Signed, Elgie Collard, PA-C 04/22/2021, 12:29 PM  [facility]

## 2021-04-22 NOTE — Progress Notes (Signed)
PROGRESS NOTE    Wayne Meadows  ZOX:[id number] DOB: 06/08/1934 DOA: 04/18/2021 PCP: Clinic, [name]   Brief Narrative:  Wayne Meadows is a 87 y.o. male with medical history significant of colitis, renal cell cancer, recurrent cellulitis, paroxysmal A-fib, hyperlipidemia, hypertension, CAD, hypothyroidism, ESRD on HD, gastric ulcer, GI bleed, COPD, anxiety, CVA presenting with persistent arm/fistula infection. Patient was NOT on antibiotics - these were started at dialysis just prior to intake. Presented for worsening pain and infection at fistula site.  Assessment & Plan:   Principal Problem:   Arteriovenous fistula infection ([place]) Active Problems:   Atrial fibrillation (HCC)   COPD (chronic obstructive pulmonary disease) (HCC)   Essential hypertension   Hypothyroidism   History of CVA (cerebrovascular accident)   Other hyperlipidemia   Chronic pancreatitis (HCC)   ESRD (end stage renal disease) on dialysis ([place])   Sepsis secondary to AV fistula infection, POA Questionable UTI, POA, Resolved - Patient was not previously on outpatient antibiotics (this was reported at intake but is inaccurate -received a single dose of vancomycin/ceftazidime at dialysis prior to admit) - Discontinue antibiotics 04/21/2021 per discussion with infectious disease, follow clinically - Remains afebrile, without WBC - Vascular surgery/nephrology/ID consulted - Cultures pending, afebrile since admission - Urine culture taken at admission shows Proteus, patient has completed 3 days of ceftazidime/cefepime   Acute symptomatic anemia, unclear source Rule out acute blood loss anemia on chronic anemia of chronic disease with concurrent chronic iron deficiency anemia - Recently admitted for GI bleed, reports ongoing black stool on occasion - Continue transfusion hemoglobin less than 7 or symptomatic, blood ideally transfused with dialysis to help moderate volume status - 1 unit PRBC ordered  -  Repeat H&H today stable - Continue to hold any anticoagulation  ESRD MWF, stable Hypokalemia - On HD Monday Wednesday Friday - Concern for fistula infection as above - Nephrology following   Hypertension - Continue home diltiazem, lisinopril - currently well controlled  Hyperlipidemia CAD Hx CVA - Continue home pravastatin, aspirin  COPD - Continue home Combivent and as needed albuterol Chronic pancreatitis - Stable, continue to monitor   DVT prophylaxis:  SCDs Code Status:  Full Family Communication: None available  Status is: Inpatient  Dispo: The patient is from: Facility              Anticipated d/c is to: Same              Anticipated d/c date is: 24-48 hours              Patient currently not medically stable for discharge -continues to be evaluated for postoperative fistula wound healing  Consultants:  Nephrology, vascular surgery  Procedures:  None  Antimicrobials:  Discontinued 04/21/2021  Subjective: No acute issues or events overnight, notes ongoing chronic low back pain and right upper extremity pain around previous fistula surgical site well controlled on current regimen.  Objective: Vitals:   04/21/21 1746 04/21/21 2002 04/21/21 2055 04/22/21 0439  BP: 137/65  (!) 158/63 (!) 144/78  Pulse: 77  71 68  Resp: 20  18 18   Temp: 99.5 F (37.5 C)  98.9 F (37.2 C) 98.2 F (36.8 C)  TempSrc:   Oral   SpO2: 94% 98% 98% 96%  Weight:    81.3 kg  Height:        Intake/Output Summary (Last 24 hours) at 04/22/2021 0731 Last data filed at 04/22/2021 0600 Gross per 24 hour  Intake 397 ml  Output 3000  ml  Net -2603 ml    Filed Weights   04/21/21 0832 04/21/21 1228 04/22/21 0439  Weight: 83.7 kg 80.7 kg 81.3 kg    Examination:  General:  Pleasantly resting in bed, No acute distress. HEENT:  Normocephalic atraumatic.  Sclerae nonicteric, noninjected.  Extraocular movements intact bilaterally. Neck:  Without mass or deformity.  Trachea is  midline. Lungs:  Clear to auscultate bilaterally without rhonchi, wheeze, or rales. Heart:  Regular rate and rhythm.  Without murmurs, rubs, or gallops. Abdomen:  Soft, nontender, nondistended.  Without guarding or rebound. Extremities: Right upper extremity fistula bandage clean dry intact  Data Reviewed: I have personally reviewed following labs and imaging studies  CBC: Recent Labs  Lab 04/18/21 1820 04/19/21 0619 04/19/21 0736 04/20/21 0341 04/21/21 0427 04/22/21 0414  WBC 12.4* 9.2  --  8.1 8.8 8.8  NEUTROABS 9.3*  --   --   --   --   --   HGB 7.9* 6.6* 7.0* 8.1* 7.6* 8.6*  HCT 25.2* 20.8* 21.5* 24.4* 23.4* 26.1*  MCV 92.6 92.4  --  90.7 91.8 91.6  PLT 350 254  --  253 250 182    Basic Metabolic Panel: Recent Labs  Lab 04/18/21 1820 04/18/21 2120 04/19/21 0619 04/20/21 0341 04/21/21 0427 04/22/21 0414  NA 137  --  138 138 138 135  K 2.9*  --  3.6 3.4* 3.7 4.2  CL 97*  --  100 101 102 98  CO2 30  --  27 27 28 28   GLUCOSE 116*  --  91 95 108* 91  BUN 11  --  15 22 32* 18  CREATININE 3.28*  --  4.04* 5.18* 6.76* 4.64*  CALCIUM 8.2*  --  7.8* 7.9* 7.8* 8.2*  MG  --  1.5*  --   --   --   --     GFR: Estimated Creatinine Clearance: 13.6 mL/min (A) (by C-G formula based on SCr of 4.64 mg/dL (H)). Liver Function Tests: Recent Labs  Lab 04/18/21 1820 04/19/21 0619  AST 18 13*  ALT 7 6  ALKPHOS 84 65  BILITOT 0.3 0.2*  PROT 6.0* 5.1*  ALBUMIN 2.2* 1.8*    No results for input(s): LIPASE, AMYLASE in the last 168 hours. No results for input(s): AMMONIA in the last 168 hours. Coagulation Profile: Recent Labs  Lab 04/18/21 1820  INR 1.1    Cardiac Enzymes: No results for input(s): CKTOTAL, CKMB, CKMBINDEX, TROPONINI in the last 168 hours. BNP (last 3 results) No results for input(s): PROBNP in the last 8760 hours. HbA1C: No results for input(s): HGBA1C in the last 72 hours. CBG: No results for input(s): GLUCAP in the last 168 hours. Lipid  Profile: No results for input(s): CHOL, HDL, LDLCALC, TRIG, CHOLHDL, LDLDIRECT in the last 72 hours. Thyroid Function Tests: No results for input(s): TSH, T4TOTAL, FREET4, T3FREE, THYROIDAB in the last 72 hours. Anemia Panel: No results for input(s): VITAMINB12, FOLATE, FERRITIN, TIBC, IRON, RETICCTPCT in the last 72 hours. Sepsis Labs: Recent Labs  Lab 04/18/21 1820 04/18/21 2120  LATICACIDVEN 2.0* 1.2     Recent Results (from the past 240 hour(s))  Resp Panel by RT-PCR (Flu A&B, Covid) Nasopharyngeal Swab     Status: None   Collection Time: 04/18/21  6:08 PM   Specimen: Nasopharyngeal Swab; Nasopharyngeal(NP) swabs in vial transport medium  Result Value Ref Range Status   SARS Coronavirus 2 by RT PCR NEGATIVE NEGATIVE Final    Comment: (NOTE) SARS-CoV-2 target  nucleic acids are NOT DETECTED.  The SARS-CoV-2 RNA is generally detectable in upper respiratory specimens during the acute phase of infection. The lowest concentration of SARS-CoV-2 viral copies this assay can detect is 138 copies/mL. A negative result does not preclude SARS-Cov-2 infection and should not be used as the sole basis for treatment or other patient management decisions. A negative result may occur with  improper specimen collection/handling, submission of specimen other than nasopharyngeal swab, presence of viral mutation(s) within the areas targeted by this assay, and inadequate number of viral copies(<138 copies/mL). A negative result must be combined with clinical observations, patient history, and epidemiological information. The expected result is Negative.  Fact Sheet for Patients:  EntrepreneurPulse.com.au  Fact Sheet for Healthcare Providers:  IncredibleEmployment.be  This test is no t yet approved or cleared by the [place] FDA and  has been authorized for detection and/or diagnosis of SARS-CoV-2 by FDA under an Emergency Use Authorization (EUA). This  EUA will remain  in effect (meaning this test can be used) for the duration of the COVID-19 declaration under Section 564(b)(1) of the Act, 21 U.S.C.section 360bbb-3(b)(1), unless the authorization is terminated  or revoked sooner.       Influenza A by PCR NEGATIVE NEGATIVE Final   Influenza B by PCR NEGATIVE NEGATIVE Final    Comment: (NOTE) The Xpert Xpress SARS-CoV-2/FLU/RSV plus assay is intended as an aid in the diagnosis of influenza from Nasopharyngeal swab specimens and should not be used as a sole basis for treatment. Nasal washings and aspirates are unacceptable for Xpert Xpress SARS-CoV-2/FLU/RSV testing.  Fact Sheet for Patients: EntrepreneurPulse.com.au  Fact Sheet for Healthcare Providers: IncredibleEmployment.be  This test is not yet approved or cleared by the [place] FDA and has been authorized for detection and/or diagnosis of SARS-CoV-2 by FDA under an Emergency Use Authorization (EUA). This EUA will remain in effect (meaning this test can be used) for the duration of the COVID-19 declaration under Section 564(b)(1) of the Act, 21 U.S.C. section 360bbb-3(b)(1), unless the authorization is terminated or revoked.  Performed at [facility], [place] [address], [place], [place] [address]   Urine Culture     Status: Abnormal   Collection Time: 04/18/21  6:08 PM   Specimen: In/Out Cath Urine  Result Value Ref Range Status   Specimen Description IN/OUT CATH URINE  Final   Special Requests   Final    NONE Performed at [facility], [place] [address] [place], [place] [address]    Culture >=100,000 COLONIES/mL PSEUDOMONAS AERUGINOSA (A)  Final   Report Status 04/21/2021 FINAL  Final   Organism ID, Bacteria PSEUDOMONAS AERUGINOSA (A)  Final      Susceptibility   Pseudomonas aeruginosa - MIC*    CEFTAZIDIME 2 SENSITIVE Sensitive     CIPROFLOXACIN <=0.25 SENSITIVE Sensitive     GENTAMICIN <=1 SENSITIVE Sensitive      IMIPENEM 1 SENSITIVE Sensitive     PIP/TAZO <=4 SENSITIVE Sensitive     CEFEPIME 2 SENSITIVE Sensitive     * >=100,000 COLONIES/mL PSEUDOMONAS AERUGINOSA  Blood Culture (routine x 2)     Status: None (Preliminary result)   Collection Time: 04/18/21  6:20 PM   Specimen: BLOOD  Result Value Ref Range Status   Specimen Description BLOOD SITE NOT SPECIFIED  Final   Special Requests   Final    BOTTLES DRAWN AEROBIC AND ANAEROBIC Blood Culture results may not be optimal due to an excessive volume of blood received in  culture bottles   Culture   Final    NO GROWTH 3 DAYS Performed at [facility], [place] [address], [place], [place] [address]    Report Status PENDING  Incomplete  Blood Culture (routine x 2)     Status: None (Preliminary result)   Collection Time: 04/18/21  9:20 PM   Specimen: BLOOD  Result Value Ref Range Status   Specimen Description BLOOD BLOOD LEFT ARM  Final   Special Requests   Final    BOTTLES DRAWN AEROBIC AND ANAEROBIC Blood Culture adequate volume   Culture   Final    NO GROWTH 3 DAYS Performed at [facility], [place] [address] [place], [place] [address]    Report Status PENDING  Incomplete     Radiology Studies: No results found.  Scheduled Meds:  (feeding supplement) PROSource Plus  30 mL Oral BID BM   Chlorhexidine Gluconate Cloth  6 each Topical Daily   Chlorhexidine Gluconate Cloth  6 each Topical Q0600   darbepoetin (ARANESP) injection - DIALYSIS  60 mcg Intravenous Q Mon-HD   diltiazem  180 mg Oral Daily   feeding supplement (NEPRO CARB STEADY)  237 mL Oral TID AC   ipratropium-albuterol  3 mL Nebulization BID   lanthanum  1,000 mg Oral TID WC   levothyroxine  50 mcg Oral QAC breakfast   pantoprazole  40 mg Oral BID   pravastatin  40 mg Oral QHS   sodium chloride flush  3 mL Intravenous Q12H     LOS: 3 days   Time spent: 68min  Ladarius C Genia Perin, DO [facility]  If 7PM-7AM, please contact  night-coverage [email or web address]  04/22/2021, 7:31 AM

## 2021-04-22 NOTE — Evaluation (Signed)
Physical Therapy Evaluation Patient Details Name: Wayne Meadows MRN: [id number] DOB: [birth year] Today's Date: 04/22/2021  History of Present Illness  87 y/o male presented to ED from SNF on 2/12 for vomiting x 2 weeks and vomiting blood today with dark coffee ground diarrhea. EGD found 2 superficial gastric ulcers and 3 clean-based duodenal ulcers. Recently admitted 1/23-2/10 for stage IV with persistent LE erythma and fluid overload. PMH: CAD, COPD, HTN, CVA, renal carcinoma  Clinical Impression  Pt admitted from [facility] with above. Pt was ambulatory with a RW in early January; reports he has been working with PT on transfers and limited walking. Pt able to sit up edge of bed with supervision. Requesting to lie back down due to increase in back pain despite max encouragement. RUE elevated and encouraged finger ROM to decrease edema. Recommend return to SNF upon discharge.     Recommendations for follow up therapy are one component of a multi-disciplinary discharge planning process, led by the attending physician.  Recommendations may be updated based on patient status, additional functional criteria and insurance authorization.  Follow Up Recommendations Skilled nursing-short term rehab (<3 hours/day)    Assistance Recommended at Discharge Frequent or constant Supervision/Assistance  Patient can return home with the following  A little help with walking and/or transfers;A little help with bathing/dressing/bathroom;Assistance with cooking/housework;Assist for transportation;Help with stairs or ramp for entrance    Equipment Recommendations Other (comment) (defer)  Recommendations for Other Services       Functional Status Assessment Patient has had a recent decline in their functional status and demonstrates the ability to make significant improvements in function in a reasonable and predictable amount of time.     Precautions / Restrictions Precautions Precautions:  Fall Restrictions Weight Bearing Restrictions: No      Mobility  Bed Mobility Overal bed mobility: Needs Assistance Bed Mobility: Supine to Sit, Sit to Supine     Supine to sit: Supervision Sit to supine: Supervision   General bed mobility comments: no physical assist required    Transfers                   General transfer comment: deferred by pt    Ambulation/Gait                  Stairs            Wheelchair Mobility    Modified Rankin (Stroke Patients Only)       Balance Overall balance assessment: Needs assistance Sitting-balance support: Feet supported Sitting balance-Leahy Scale: Good                                       Pertinent Vitals/Pain Pain Assessment Pain Assessment: Faces Faces Pain Scale: Hurts even more Pain Location: back, RUE Pain Descriptors / Indicators: Discomfort, Grimacing, Guarding Pain Intervention(s): Limited activity within patient's tolerance, Monitored during session    Home Living Family/patient expects to be discharged to:: Private residence Living Arrangements: Alone Available Help at Discharge: Family;Available PRN/intermittently Type of Home: House Home Access: Stairs to enter Entrance Stairs-Rails: None Entrance Stairs-Number of Steps: 2   Home Layout: One level Home Equipment: Rollator (4 wheels);Cane - single point Additional Comments: Pt plans to return to [facility]    Prior Function Prior Level of Function : Needs assist             Mobility Comments: At beginning of January,  pt was ambulating > 100' with RW. Pt reports since at [place] has been working with PT ADLs Comments: needs assist     Hand Dominance   Dominant Hand: Right    Extremity/Trunk Assessment   Upper Extremity Assessment Upper Extremity Assessment: RUE deficits/detail RUE Deficits / Details: Ace wrap donned, moderate edema    Lower Extremity Assessment Lower Extremity Assessment:  Generalized weakness    Cervical / Trunk Assessment Cervical / Trunk Assessment: Kyphotic  Communication   Communication: HOH  Cognition Arousal/Alertness: Awake/alert Behavior During Therapy: WFL for tasks assessed/performed Overall Cognitive Status: No family/caregiver present to determine baseline cognitive functioning                                 General Comments: Pt self limiting        General Comments      Exercises     Assessment/Plan    PT Assessment Patient needs continued PT services  PT Problem List Decreased strength;Decreased activity tolerance;Decreased balance;Decreased coordination;Decreased mobility;Cardiopulmonary status limiting activity;Decreased skin integrity;Pain       PT Treatment Interventions Gait training;Therapeutic exercise;Patient/family education;DME instruction;Therapeutic activities;Functional mobility training;Neuromuscular re-education;Balance training    PT Goals (Current goals can be found in the Care Plan section)  Acute Rehab PT Goals Patient Stated Goal: to finish rehab for home PT Goal Formulation: With patient Time For Goal Achievement: 05/06/21 Potential to Achieve Goals: Good    Frequency Min 2X/week     Co-evaluation               AM-PAC PT "6 Clicks" Mobility  Outcome Measure Help needed turning from your back to your side while in a flat bed without using bedrails?: A Little Help needed moving from lying on your back to sitting on the side of a flat bed without using bedrails?: A Little Help needed moving to and from a bed to a chair (including a wheelchair)?: A Lot Help needed standing up from a chair using your arms (e.g., wheelchair or bedside chair)?: A Lot Help needed to walk in hospital room?: Total Help needed climbing 3-5 steps with a railing? : Total 6 Click Score: 12    End of Session   Activity Tolerance: Patient limited by pain Patient left: in bed;with call bell/phone within  reach;with bed alarm set Nurse Communication: Mobility status PT Visit Diagnosis: Difficulty in walking, not elsewhere classified (R26.2);Muscle weakness (generalized) (M62.81);Unsteadiness on feet (R26.81);Other abnormalities of gait and mobility (R26.89) Pain - Right/Left: Right Pain - part of body: Arm    Time: 5573-2202 PT Time Calculation (min) (ACUTE ONLY): 17 min   Charges:   PT Evaluation $PT Eval Moderate Complexity: 1 Mod          Wyona Almas, PT, DPT Acute Rehabilitation Services Pager [phone] Office [phone]   Deno Etienne 04/22/2021, 4:54 PM

## 2021-04-22 NOTE — TOC Progression Note (Signed)
Transition of Care [facility]) - Progression Note    Patient Details  Name: Wayne Meadows MRN: [id number] Date of Birth: 1934-10-01  Transition of Care [facility]) CM/SW [place], [place] Phone Number: 04/22/2021, 2:54 PM  Clinical Narrative:     CSW notified Jani with [facility] of likely DC tomorrow. She reports that pt was there using Medicare part A. Should not need auth.   Expected Discharge Plan: Skilled Nursing Facility Barriers to Discharge: Continued Medical Work up  Expected Discharge Plan and Services Expected Discharge Plan: [place]                                               Social Determinants of Health (SDOH) Interventions    Readmission Risk Interventions Readmission Risk Prevention Plan 03/21/2021 03/19/2021  Transportation Screening Complete -  [place] or Home Care Consult Complete -  SW Recovery Care/Counseling Consult Complete Complete  Skilled Nursing Facility Complete Complete  Some recent data might be hidden

## 2021-04-22 NOTE — Progress Notes (Signed)
°  Progress Note    04/22/2021 12:16 PM * No surgery found *  Subjective:  no complaints   Vitals:   04/22/21 0848 04/22/21 0930  BP:  (!) 165/48  Pulse:  85  Resp:  18  Temp:  99.4 F (37.4 C)  SpO2: 97% 96%   Physical Exam: Cardiac:  regular Lungs:  non labored Incisions:  right AC incision unchanged from yesterday. There is still fibrinous exudate in wound bed. 1 cm x 2 cm deep medial opening. This was repacked with iodoform packing. 4x4s, kerlix and ACE applied Extremities:  well perfused and warm. 2+ right radial pulse. Good thrill in right AV Fistula Neurologic: alert and oriented  CBC    Component Value Date/Time   WBC 8.8 04/22/2021 0414   RBC 2.85 (L) 04/22/2021 0414   HGB 8.6 (L) 04/22/2021 0414   HCT 26.1 (L) 04/22/2021 0414   PLT 287 04/22/2021 0414   MCV 91.6 04/22/2021 0414   MCH 30.2 04/22/2021 0414   MCHC 33.0 04/22/2021 0414   RDW 15.7 (H) 04/22/2021 0414   LYMPHSABS 1.7 04/18/2021 1820   MONOABS 1.1 (H) 04/18/2021 1820   EOSABS 0.2 04/18/2021 1820   BASOSABS 0.1 04/18/2021 1820    BMET    Component Value Date/Time   NA 135 04/22/2021 0414   K 4.2 04/22/2021 0414   CL 98 04/22/2021 0414   CO2 28 04/22/2021 0414   GLUCOSE 91 04/22/2021 0414   BUN 18 04/22/2021 0414   CREATININE 4.64 (H) 04/22/2021 0414   CALCIUM 8.2 (L) 04/22/2021 0414   GFRNONAA 12 (L) 04/22/2021 0414   GFRAA 52 (L) 07/31/2017 1630    INR    Component Value Date/Time   INR 1.1 04/18/2021 1820     Intake/Output Summary (Last 24 hours) at 04/22/2021 1216 Last data filed at 04/22/2021 0900 Gross per 24 hour  Intake 537 ml  Output 3000 ml  Net -2463 ml     Assessment/Plan:  87 y.o. male admitted with concerns of infected right AV fistula. Dehiscence of right AC incision for brachiocephalic AV fistula. Dressings changed this morning. Well appearing. No drainage. No erythema or signs of infection. Iodoform packing reapplied Afebrile. No leukocytosis Will order daily  dressing changes by RN while admitted From vascular standpoint he can go home with daily packing changes Will arrange close follow up in our office for wound check in 1 week   Karoline Caldwell, PA-C [facility] [phone] 04/22/2021 12:16 PM

## 2021-04-23 ENCOUNTER — Inpatient Hospital Stay (HOSPITAL_COMMUNITY): Payer: No Typology Code available for payment source

## 2021-04-23 DIAGNOSIS — N186 End stage renal disease: Secondary | ICD-10-CM | POA: Diagnosis not present

## 2021-04-23 DIAGNOSIS — T827XXS Infection and inflammatory reaction due to other cardiac and vascular devices, implants and grafts, sequela: Secondary | ICD-10-CM | POA: Diagnosis not present

## 2021-04-23 DIAGNOSIS — I10 Essential (primary) hypertension: Secondary | ICD-10-CM | POA: Diagnosis not present

## 2021-04-23 DIAGNOSIS — K861 Other chronic pancreatitis: Secondary | ICD-10-CM | POA: Diagnosis not present

## 2021-04-23 LAB — BASIC METABOLIC PANEL
Anion gap: 9 (ref 5–15)
BUN: 29 mg/dL — ABNORMAL HIGH (ref 8–23)
CO2: 26 mmol/L (ref 22–32)
Calcium: 8.1 mg/dL — ABNORMAL LOW (ref 8.9–10.3)
Chloride: 100 mmol/L (ref 98–111)
Creatinine, Ser: 6.01 mg/dL — ABNORMAL HIGH (ref 0.61–1.24)
GFR, Estimated: 9 mL/min — ABNORMAL LOW (ref >60)
Glucose, Bld: 89 mg/dL (ref 70–99)
Potassium: 3.4 mmol/L — ABNORMAL LOW (ref 3.5–5.1)
Sodium: 135 mmol/L (ref 135–145)

## 2021-04-23 LAB — CULTURE, BLOOD (ROUTINE X 2)
Culture: NO GROWTH
Culture: NO GROWTH
Special Requests: ADEQUATE

## 2021-04-23 LAB — CBC
HCT: 23.7 % — ABNORMAL LOW (ref 39.0–52.0)
Hemoglobin: 7.5 g/dL — ABNORMAL LOW (ref 13.0–17.0)
MCH: 29.3 pg (ref 26.0–34.0)
MCHC: 31.6 g/dL (ref 30.0–36.0)
MCV: 92.6 fL (ref 80.0–100.0)
Platelets: 245 10*3/uL (ref 150–400)
RBC: 2.56 MIL/uL — ABNORMAL LOW (ref 4.22–5.81)
RDW: 15.7 % — ABNORMAL HIGH (ref 11.5–15.5)
WBC: 7.6 10*3/uL (ref 4.0–10.5)
nRBC: 0 % (ref 0.0–0.2)

## 2021-04-23 MED ORDER — PROSOURCE PLUS PO LIQD: 30.0000 mL | Freq: Two times a day (BID) | ORAL | Status: DC

## 2021-04-23 MED ORDER — CAMPHOR-MENTHOL 0.5-0.5 % EX LOTN: TOPICAL_LOTION | CUTANEOUS | 0 refills | Status: DC | PRN

## 2021-04-23 MED ORDER — NAPHAZOLINE-GLYCERIN 0.012-0.25 % OP SOLN: 1.0000 [drp] | Freq: Three times a day (TID) | OPHTHALMIC | Status: DC

## 2021-04-23 MED ORDER — NAPHAZOLINE-GLYCERIN 0.012-0.25 % OP SOLN
1.0000 [drp] | Freq: Three times a day (TID) | OPHTHALMIC | Status: DC
  Administered 2021-04-23 – 2021-04-24 (×3): 2 [drp] via OPHTHALMIC
  Filled 2021-04-23: qty 15

## 2021-04-23 MED ORDER — HYDROCODONE-ACETAMINOPHEN 5-325 MG PO TABS
1.0000 | ORAL_TABLET | Freq: Four times a day (QID) | ORAL | 0 refills | Status: DC | PRN
Start: 2021-04-23 — End: 2021-09-17

## 2021-04-23 MED ORDER — NEPRO/CARBSTEADY PO LIQD: 237.0000 mL | Freq: Three times a day (TID) | ORAL | 0 refills | Status: DC

## 2021-04-23 NOTE — Progress Notes (Signed)
Pt currently in dialysis.  We will be back later today or tomorrow to check his arm incision.  Ok for regular dressing changes per order\ with 1/2 inch iodoform gauze daily.  If discharged home, continue daily dressing changes and we will see him back in the office on 3/7.  Appt has already been made.  ? ? ?Leontine Locket, PAC ?04/23/2021. ?7:29 AM ? ?

## 2021-04-23 NOTE — Progress Notes (Signed)
D/C order noted. Contacted [facility] to make clinic aware of pt's d/c today to snf and pt will resume care on Friday.  ? ?Melven Sartorius ?Renal Navigator ?[phone] ?

## 2021-04-23 NOTE — TOC Transition Note (Signed)
Transition of Care (TOC) - CM/SW Discharge Note ? ? ?Patient Details  ?Name: PARRY PO ?MRN: [id number] ?Date of Birth: [birth year] ? ?Transition of Care (TOC) CM/SW Contact:  ?Paulene Floor Joyclyn Plazola, LCSWA ?Phone Number: ?04/23/2021, 4:10 PM ? ? ?Clinical Narrative:    ?Patient will DC to: [facility] ?Anticipated DC date:  04/23/2021 ?Family notified:  Yes ?Transport by: Corey Harold ? ? ?Per MD patient ready for DC to SNF. RN to call report prior to discharge [phone] room 3221. RN, patient, patient's family, and facility notified of DC. Discharge Summary and FL2 sent to facility. DC packet on chart. Ambulance transport requested for patient.  ? ?CSW will sign off for now as social work intervention is no longer needed. Please consult [place] again if new needs arise. ?  ? ? ?Final next level of care: [place] ?Barriers to Discharge: Barriers Resolved ? ? ?Patient Goals and CMS Choice ?  ?  ?  ? ?Discharge Placement ?  ?           ?Patient chooses bed at: [facility] ?Patient to be transferred to facility by: [facility] ?Name of family member notified: Lyna Poser (Daughter)   [phone] ?Patient and family notified of of transfer: 04/23/21 ? ?Discharge Plan and Services ?  ?  ?           ?  ?  ?  ?  ?  ?  ?  ?  ?  ?  ? ?Social Determinants of Health (SDOH) Interventions ?  ? ? ?Readmission Risk Interventions ?Readmission Risk Prevention Plan 03/21/2021 03/19/2021  ?Transportation Screening Complete -  ?[place] or Home Care Consult Complete -  ?SW Recovery Care/Counseling Consult Complete Complete  ?Skilled Nursing Facility Complete Complete  ?Some recent data might be hidden  ? ? ? ? ? ?

## 2021-04-23 NOTE — Progress Notes (Signed)
?[facility] ?Progress Note  ? ?Subjective:    ? ?Seen and examined on HD.  BFR 400 mL /min via TDC UF goal 2.5L.  R arm dressed in [place].  Reports R eye gritty feeling ? ?Objective ?Vitals:  ? 04/23/21 0900 04/23/21 0930 04/23/21 1000 04/23/21 1030  ?BP: (!) 171/67 (!) 155/58 (!) 178/66 (!) 166/62  ?Pulse: 100     ?Resp: 19 17 16 18   ?Temp:      ?TempSrc:      ?SpO2:      ?Weight:      ?Height:      ? ?Physical Exam ?General: NAD; Sitting up in bed ?HEENT: R eye slightly red and watery ?Heart: S1 and S2; No murmurs, gallops, or rubs ?Lungs: mild congestion with expiratory wheezing bilaterally ?Abdomen: round and distended ?Extremities: no edema BLLE; noted ace-wrap at LEs ?Dialysis Access: R IJ TDC; RUE AVF in ace-wrap  ? ?Filed Weights  ? 04/22/21 0439 04/23/21 0550 04/23/21 0710  ?Weight: 81.3 kg 78.5 kg 78.5 kg  ? ? ?Intake/Output Summary (Last 24 hours) at 04/23/2021 1044 ?Last data filed at 04/22/2021 2137 ?Gross per 24 hour  ?Intake 400 ml  ?Output 275 ml  ?Net 125 ml  ? ? ?Additional Objective ?Labs: ?Basic Metabolic Panel: ?Recent Labs  ?Lab 04/21/21 ?1007 04/22/21 ?0414 04/23/21 ?1219  ?NA 138 135 135  ?K 3.7 4.2 3.4*  ?CL 102 98 100  ?CO2 28 28 26   ?GLUCOSE 108* 91 89  ?BUN 32* 18 29*  ?CREATININE 6.76* 4.64* 6.01*  ?CALCIUM 7.8* 8.2* 8.1*  ? ?Liver Function Tests: ?Recent Labs  ?Lab 04/18/21 ?1820 04/19/21 ?7588  ?AST 18 13*  ?ALT 7 6  ?ALKPHOS 84 65  ?BILITOT 0.3 0.2*  ?PROT 6.0* 5.1*  ?ALBUMIN 2.2* 1.8*  ? ?No results for input(s): LIPASE, AMYLASE in the last 168 hours. ?CBC: ?Recent Labs  ?Lab 04/18/21 ?1820 04/19/21 ?3254 04/19/21 ?9826 04/20/21 ?4158 04/21/21 ?3094 04/22/21 ?0768 04/23/21 ?0881  ?WBC 12.4* 9.2  --  8.1 8.8 8.8 7.6  ?NEUTROABS 9.3*  --   --   --   --   --   --   ?HGB 7.9* 6.6*   < > 8.1* 7.6* 8.6* 7.5*  ?HCT 25.2* 20.8*   < > 24.4* 23.4* 26.1* 23.7*  ?MCV 92.6 92.4  --  90.7 91.8 91.6 92.6  ?PLT 350 254  --  253 250 287 245  ? < > = values in this interval not  displayed.  ? ?Blood Culture ?   ?Component Value Date/Time  ? SDES BLOOD BLOOD LEFT ARM 04/18/2021 2120  ? SPECREQUEST  04/18/2021 2120  ?  BOTTLES DRAWN AEROBIC AND ANAEROBIC Blood Culture adequate volume  ? CULT  04/18/2021 2120  ?  NO GROWTH 4 DAYS ?Performed at [facility], [address], [place], [place] [address] ?  ? REPTSTATUS PENDING 04/18/2021 2120  ? ? ?Cardiac Enzymes: ?No results for input(s): CKTOTAL, CKMB, CKMBINDEX, TROPONINI in the last 168 hours. ?CBG: ?No results for input(s): GLUCAP in the last 168 hours. ?Iron Studies: No results for input(s): IRON, TIBC, TRANSFERRIN, FERRITIN in the last 72 hours. ?Lab Results  ?Component Value Date  ? INR 1.1 04/18/2021  ? INR 1.2 04/09/2021  ? INR 1.3 (H) 04/08/2021  ? ?Studies/Results: ?No results found. ? ?Medications: ? sodium chloride    ? sodium chloride    ? ? (feeding supplement) PROSource Plus  30 mL Oral BID BM  ? Chlorhexidine  Gluconate Cloth  6 each Topical Q0600  ? darbepoetin (ARANESP) injection - DIALYSIS  60 mcg Intravenous Q Mon-HD  ? diltiazem  180 mg Oral Daily  ? feeding supplement (NEPRO CARB STEADY)  237 mL Oral TID AC  ? ipratropium-albuterol  3 mL Nebulization BID  ? lanthanum  1,000 mg Oral TID WC  ? levothyroxine  50 mcg Oral QAC breakfast  ? pantoprazole  40 mg Oral BID  ? pravastatin  40 mg Oral QHS  ? sodium chloride flush  3 mL Intravenous Q12H  ? ? ?Dialysis Orders: ?MWF [facility] 3h 300/500 83kg 2K 2.25Ca  ?[place] , RU AVF maturing ?Hep 3000  ?mircera 50q2wks (not yet given)  ? ?Assessment/Plan: ?Infected RUE wound - wound culture and blood culture taken at OP dialysis center- CONS and yeast from 2/24.  BC collected here as well-from 2/24 (-) X 4 days. ABX d/c'd 2/27. Pt completed 3 days ceftazidime/cefepime. Wound packed by VVS with daily dressing changes.   ?ESRD -  on HD MWF. HD today 04/23/21 ?Hypertension/volume  - BP elevated, lots of edema.  Increasing UF goal for today- probe for EDW, will likely need new on d/c. ?Acute on  chronic Anemia of CKD - recent admit for GIB.  Hgb drop since admit 7.9>6.6, now improved to 8.6 s/p 1 unit pRBC.  ESA with HD  Transfuse prn. ?Secondary Hyperparathyroidism -  Corrected Ca in goal.  Not on VDRA or binders.  Check phos. Marland Kitchen    ?Nutrition - Alb 1.8 - on protein supplements.  Renal diet w/fluid restrictions.  ?Hx CVA ?Hx COPD on 2L O2 at baseline ?Dispo: Ok to go from renal perspective. ? ?Madelon Lips MD ?[facility] ?04/23/2021,10:44 AM ? LOS: 4 days  ?  ?

## 2021-04-23 NOTE — Progress Notes (Signed)
[facility] [place] ?PROGRESS NOTE ? ?ASSESSMENT / PLAN: ?Wayne Meadows is a 87 y.o. male with right antecubital wound dehiscence. Wound granulating at the base. Repacked today. Trying to manage non-operatively. Call vascular surgery for any bleeding from wound.   ? ?SUBJECTIVE: ?No new problems. Understandably frustrated with recovery. ? ?OBJECTIVE: ?BP (!) 187/65   Pulse (!) 106   Temp 98.6 ?F (37 ?C) (Oral)   Resp 18   Ht 6\' 2"  (1.88 m)   Wt 78.5 kg   SpO2 90%   BMI 22.22 kg/m?  ? ?Intake/Output Summary (Last 24 hours) at 04/23/2021 1309 ?Last data filed at 04/23/2021 1046 ?Gross per 24 hour  ?Intake 280 ml  ?Output 2275 ml  ?Net -1995 ml  ?  ?Constitutional: Elderly; chronically ill appearing. no acute distress. ?Vascular: Unchanged appearance of right forearm wound. Repacked with iodoform gently.  ? ?CBC Latest Ref Rng & Units 04/23/2021 04/22/2021 04/21/2021  ?WBC 4.0 - 10.5 K/uL 7.6 8.8 8.8  ?Hemoglobin 13.0 - 17.0 g/dL 7.5(L) 8.6(L) 7.6(L)  ?Hematocrit 39.0 - 52.0 % 23.7(L) 26.1(L) 23.4(L)  ?Platelets 150 - 400 K/uL 245 287 250  ?  ? ?CMP Latest Ref Rng & Units 04/23/2021 04/22/2021 04/21/2021  ?Glucose 70 - 99 mg/dL 89 91 108(H)  ?BUN 8 - 23 mg/dL 29(H) 18 32(H)  ?Creatinine 0.61 - 1.24 mg/dL 6.01(H) 4.64(H) 6.76(H)  ?Sodium 135 - 145 mmol/L 135 135 138  ?Potassium 3.5 - 5.1 mmol/L 3.4(L) 4.2 3.7  ?Chloride 98 - 111 mmol/L 100 98 102  ?CO2 22 - 32 mmol/L 26 28 28   ?Calcium 8.9 - 10.3 mg/dL 8.1(L) 8.2(L) 7.8(L)  ?Total Protein 6.5 - 8.1 g/dL - - -  ?Total Bilirubin 0.3 - 1.2 mg/dL - - -  ?Alkaline Phos 38 - 126 U/L - - -  ?AST 15 - 41 U/L - - -  ?ALT 0 - 44 U/L - - -  ? ? ?Estimated Creatinine Clearance: 10.2 mL/min (A) (by C-G formula based on SCr of 6.01 mg/dL (H)). ? ?Wayne Meadows. Wayne Breed, MD ?[facility] [place] ?Office Phone Number: [phone] ?04/23/2021 1:09 PM ? ? ? ?

## 2021-04-23 NOTE — Plan of Care (Signed)
  Problem: Health Behavior/Discharge Planning: Goal: Ability to manage health-related needs will improve Outcome: Adequate for Discharge   Problem: Clinical Measurements: Goal: Ability to maintain clinical measurements within normal limits will improve Outcome: Adequate for Discharge Goal: Will remain free from infection Outcome: Adequate for Discharge Goal: Diagnostic test results will improve Outcome: Adequate for Discharge Goal: Respiratory complications will improve Outcome: Adequate for Discharge Goal: Cardiovascular complication will be avoided Outcome: Adequate for Discharge   Problem: Activity: Goal: Risk for activity intolerance will decrease Outcome: Adequate for Discharge   

## 2021-04-23 NOTE — Discharge Summary (Addendum)
Physician Discharge Summary   Patient: Wayne Meadows MRN: [id number] DOB: 1934/11/01  Admit date:     04/18/2021  Discharge date: 04/24/21  Discharge Physician: Alma Friendly   PCP: [name]   Recommendations at discharge:   Follow up with Vascular surgery on 04/29/21 Follow up with PCP in 1 week Follow up with dialysis   Discharge Diagnoses: Principal Problem:   Arteriovenous fistula infection ([place]) Active Problems:   History of CVA (cerebrovascular accident)   COPD (chronic obstructive pulmonary disease) ([place])   Other hyperlipidemia   Atrial fibrillation ([place])   Essential hypertension   Chronic pancreatitis ([place])   Hypothyroidism   ESRD (end stage renal disease) on dialysis [facility])    Hospital Course: Wayne Meadows is a 87 y.o. male with medical history significant of colitis, renal cell cancer, recurrent cellulitis, paroxysmal A-fib, hyperlipidemia, hypertension, CAD, hypothyroidism, ESRD on HD, gastric ulcer, GI bleed, COPD, anxiety, CVA presenting with persistent arm/fistula infection. Patient was NOT on antibiotics - these were started at dialysis just prior to intake. Presented for worsening pain and possible infection at fistula site.   Today, pt denies any cough, worsening SOB, chest pain, fever/chills. Will need O2 for d/c and should be weaned off if possible. Stable to d/c to SNF with close follow up with Vascular and PCP. Of note, pt had a Foley catheter that was placed about a month ago for ??monitoring urine outpt/urine retention, of which no follow-up was done.  Foley catheter was removed on 04/23/2021, serial bladder scans done, showed no more than 150 cc of urine.  Not surprising as patient is now ESRD on HD.  Follow-up with HD as an outpatient   Assessment and Plan:  Sepsis secondary to right arm AV fistula infection, POA Questionable UTI, POA, Resolved Received a single dose of vancomycin/ceftazidime at dialysis prior to  admit Discontinued antibiotics 04/21/2021 per discussion with infectious disease, follow clinically Remained afebrile, without WBC, off AB BC X 2 NGTD Urine Cx grew pseudomonas, completed 3 days of ceftazidime/cefepime Follow up with Vascular surgery on 04/29/21- advise strict wound dressing  Acute symptomatic anemia Acute on chronic anemia of CKD, with concurrent chronic iron deficiency anemia Recently admitted for GI bleed Received 1U of PRBC during this admission, ESA given with HD Continue oral iron supplementation    ESRD MWF, stable Hypokalemia On HD Monday Wednesday Friday  Hypertension Continue home diltiazem, clonidine, hydralazine prn C urrently well controlled  Hyperlipidemia CAD Hx CVA Continue home pravastatin, aspirin  COPD - Continue home Combivent and as needed albuterol  Chronic pancreatitis - Stable, continue to monitor          Pain control - [place] was reviewed. and patient was instructed, not to drive, operate heavy machinery, perform activities at heights, swimming or participation in water activities or provide baby-sitting services while on Pain, Sleep and Anxiety Medications; until their outpatient Physician has advised to do so again. Also recommended to not to take more than prescribed Pain, Sleep and Anxiety Medications.   Consultants: Nephrology, Vascular, ID Procedures performed: None Disposition: Skilled nursing facility Diet recommendation:  Renal diet  DISCHARGE MEDICATION: Allergies as of 04/23/2021       Reactions   Bee Venom Anaphylaxis   Has epi pen   Influenza Vaccines Other (See Comments)   "Mortally sick for 2 weeks"        Medication List     STOP taking these medications  amoxicillin-clavulanate 875-125 MG tablet Commonly known as: AUGMENTIN       TAKE these medications    (feeding supplement) PROSource Plus liquid Take 30 mLs by mouth 2 (two) times daily  between meals.   feeding supplement (NEPRO CARB STEADY) Liqd Take 237 mLs by mouth 3 (three) times daily before meals.   acetaminophen 325 MG tablet Commonly known as: TYLENOL Take 2 tablets (650 mg total) by mouth every 6 (six) hours as needed for mild pain (or Fever >/= 101).   albuterol 108 (90 Base) MCG/ACT inhaler Commonly known as: VENTOLIN HFA Inhale 1 puff into the lungs every 6 (six) hours as needed for shortness of breath.   aspirin EC 81 MG tablet Take 81 mg by mouth daily.   camphor-menthol lotion Commonly known as: SARNA Apply topically as needed for itching.   cloNIDine 0.1 MG tablet Commonly known as: CATAPRES Take 1 tablet (0.1 mg total) by mouth daily.   diltiazem 180 MG 24 hr capsule Commonly known as: CARDIZEM CD Take 1 capsule (180 mg total) by mouth daily.   ergocalciferol 1.25 MG (50000 UT) capsule Commonly known as: VITAMIN D2 Take 50,000 Units by mouth once a week.   ferrous sulfate 325 (65 FE) MG tablet Take 1 tablet by mouth See admin instructions. Take one tablet by mouth Every Monday, Wednesday, and Friday   hydrALAZINE 10 MG tablet Commonly known as: APRESOLINE Take 1 tablet (10 mg total) by mouth every 8 (eight) hours as needed (for SBP greater than 180).   HYDROcodone-acetaminophen 5-325 MG tablet Commonly known as: NORCO/VICODIN Take 1 tablet by mouth every 6 (six) hours as needed for moderate pain or severe pain. What changed:  how much to take when to take this   Ipratropium-Albuterol 20-100 MCG/ACT Aers respimat Commonly known as: COMBIVENT Inhale 1 puff into the lungs 4 (four) times daily.   lanthanum 1000 MG chewable tablet Commonly known as: FOSRENOL Chew 1 tablet (1,000 mg total) by mouth 3 (three) times daily with meals.   levothyroxine 50 MCG tablet Commonly known as: SYNTHROID Take 50 mcg by mouth daily before breakfast.   multivitamin tablet Take 1 tablet by mouth daily.   naphazoline-glycerin 0.012-0.25 %  Soln Commonly known as: CLEAR EYES REDNESS Place 1-2 drops into the right eye 3 (three) times daily.   neomycin-bacitracin-polymyxin Oint Commonly known as: NEOSPORIN Apply 1 application topically 3 (three) times daily. To right antecubital area   ondansetron 4 MG tablet Commonly known as: Zofran Take 1 tablet (4 mg total) by mouth daily as needed for nausea or vomiting.   pantoprazole 40 MG tablet Commonly known as: PROTONIX Take 1 tablet (40 mg total) by mouth 2 (two) times daily.   polyethylene glycol 17 g packet Commonly known as: MIRALAX / GLYCOLAX Take 17 g by mouth daily as needed for mild constipation or moderate constipation.   pravastatin 40 MG tablet Commonly known as: PRAVACHOL Take 40 mg by mouth at bedtime.   tamsulosin 0.4 MG Caps capsule Commonly known as: FLOMAX Take 1 capsule (0.4 mg total) by mouth daily.        Follow-up Information     Clinic, [facility]. Schedule an appointment as soon as possible for a visit in 1 week(s).   Contact information: [place] [address] [phone]         Sherren Mocha, MD .   Specialty: Cardiology Contact information: [phone] [place] [address] [phone]  Marty Heck, MD. Go on 04/29/2021.   Specialty: Vascular Surgery Why: Follow up for your fistula wound Contact information: [address] [place] [address] [phone]                 Discharge Exam: Filed Weights   04/22/21 0439 04/23/21 0550 04/23/21 0710  Weight: 81.3 kg 78.5 kg 78.5 kg   General: NAD  Cardiovascular: S1, S2 present Respiratory: CTAB Abdomen: Soft, nontender, nondistended, bowel sounds present Musculoskeletal: No bilateral pedal edema noted with noted B/l ace wrap. Rt arm with ace dressing Skin: As above Psychiatry: Normal mood   Condition at discharge: stable  The results of significant diagnostics from this  hospitalization (including imaging, microbiology, ancillary and laboratory) are listed below for reference.   Imaging Studies: CT ABDOMEN PELVIS WO CONTRAST  Result Date: 04/06/2021 CLINICAL DATA:  Abdominal pain. EXAM: CT ABDOMEN AND PELVIS WITHOUT CONTRAST TECHNIQUE: Multidetector CT imaging of the abdomen and pelvis was performed following the standard protocol without IV contrast. RADIATION DOSE REDUCTION: This exam was performed according to the departmental dose-optimization program which includes automated exposure control, adjustment of the mA and/or kV according to patient size and/or use of iterative reconstruction technique. COMPARISON:  CT abdomen pelvis dated 08/12/2020. FINDINGS: Evaluation of this exam is limited in the absence of intravenous contrast. Evaluation is also limited due to streak artifact caused by patient's arms. Lower chest: Partially visualized small bilateral pleural effusions, left greater right with associated partial compressive atelectasis of the lower lobes versus pneumonia. Clinical correlation is recommended. Three vessel coronary vascular calcification and small pericardial effusion. Partially visualized central venous line with tip at the cavoatrial junction. No intra-abdominal free air or free fluid. Hepatobiliary: Ill-defined 2.5 cm hypodense lesion in the right lobe of the liver not characterized on this CT. No intrahepatic biliary dilatation. Cholecystectomy. Pancreas: Atrophic pancreas with scattered coarse calcification suggestive of chronic pancreatitis. No active inflammatory changes. Spleen: Normal in size without focal abnormality. Adrenals/Urinary Tract: The adrenal glands are suboptimally visualized. There is no hydronephrosis or nephrolithiasis on either side. Bilateral renal cysts measure up to 4.5 cm in the upper pole of the right kidney. Additional smaller hypodense lesions are not characterized on this CT. The visualized ureters appear unremarkable. The  urinary bladder is decompressed around a Foley catheter. Stomach/Bowel: Mild stranding surrounding the duodenal C-loop may represent duodenum this. Clinical correlation is recommended. There is sigmoid diverticulosis and scattered colonic diverticula without active inflammatory changes. No bowel obstruction. Vascular/Lymphatic: Advanced aortoiliac atherosclerotic disease. The IVC is unremarkable. No portal venous gas. There is no adenopathy. Reproductive: The prostate gland is poorly visualized. Other: Mild diffuse subcutaneous edema. Musculoskeletal: Osteopenia with degenerative changes of the spine. Lower lumbar posterior fusion hardware as well as right hip arthroplasty. No acute osseous pathology. IMPRESSION: 1. Mild stranding surrounding the duodenal C-loop may represent duodenum. No bowel obstruction. 2. Colonic diverticulosis. 3. No hydronephrosis or nephrolithiasis. 4. Partially visualized small bilateral pleural effusions, left greater right with associated partial compressive atelectasis of the lower lobes versus pneumonia. 5. Ill-defined 2.5 cm hypodense lesion in the right lobe of the liver not characterized on this CT. 6. Aortic Atherosclerosis (ICD10-I70.0). Electronically Signed   By: Anner Crete M.D.   On: 04/06/2021 03:16   DG Chest 2 View  Result Date: 04/18/2021 CLINICAL DATA:  Questionable sepsis EXAM: CHEST - 2 VIEW COMPARISON:  04/06/2021 FINDINGS: Right dialysis catheter remains in place, unchanged. Small bilateral pleural effusions. Bibasilar atelectasis or infiltrates. Findings similar to prior  study. Heart is normal size. No acute bony abnormality. IMPRESSION: Small bilateral pleural effusions with bibasilar atelectasis or infiltrates. Electronically Signed   By: Rolm Baptise M.D.   On: 04/18/2021 19:28   DG Chest 2 View  Result Date: 03/27/2021 CLINICAL DATA:  Weakness, COPD, former smoker EXAM: CHEST - 2 VIEW COMPARISON:  Chest x-ray 03/09/2021. FINDINGS: The heart and  mediastinal contours are unchanged. Aortic calcification. Bibasilar vague airspace opacities. Chronic coarsened insert markings with no overt pulmonary edema. Bilateral at least small volume pleural effusions. Effusion. No pneumothorax. No acute osseous abnormality. IMPRESSION: 1. Bilateral at least small volume pleural effusions. 2. Bibasilar vague airspace opacities may represent a combination of atelectasis versus infection/inflammation. 3. Aortic Atherosclerosis (ICD10-I70.0) and Emphysema (ICD10-J43.9). Electronically Signed   By: Iven Finn M.D.   On: 03/27/2021 15:05   IR Fluoro Guide CV Line Right  Result Date: 03/31/2021 INDICATION: 87 year old male with progressive renal dysfunction in need of acute hemodialysis. He presents for EXAM: TUNNELED CENTRAL VENOUS HEMODIALYSIS CATHETER PLACEMENT WITH ULTRASOUND AND FLUOROSCOPIC GUIDANCE MEDICATIONS: 2 g Ancef. The antibiotic was given in an appropriate time interval prior to skin puncture. ANESTHESIA/SEDATION: Moderate (conscious) sedation was employed during this procedure. A total of Versed 1 mg and Fentanyl 25 mcg was administered intravenously. Moderate Sedation Time: 17 minutes. The patient's level of consciousness and vital signs were monitored continuously by radiology nursing throughout the procedure under my direct supervision. FLUOROSCOPY TIME:  2 mGy, air kerma COMPLICATIONS: None immediate. PROCEDURE: Informed written consent was obtained from the patient after a discussion of the risks, benefits, and alternatives to treatment. Questions regarding the procedure were encouraged and answered. The right neck and chest were prepped with chlorhexidine in a sterile fashion, and a sterile drape was applied covering the operative field. Maximum barrier sterile technique with sterile gowns and gloves were used for the procedure. A timeout was performed prior to the initiation of the procedure. After creating a small venotomy incision, a micropuncture  kit was utilized to access the right internal jugular vein under direct, real-time ultrasound guidance after the overlying soft tissues were anesthetized with 1% lidocaine with epinephrine. Ultrasound image documentation was performed. The microwire was kinked to measure appropriate catheter length. A stiff Glidewire was advanced to the level of the IVC and the micropuncture sheath was exchanged for a peel-away sheath. A Palindrome tunneled hemodialysis catheter measuring 23 cm from tip to cuff was tunneled in a retrograde fashion from the anterior chest wall to the venotomy incision. The catheter was then placed through the peel-away sheath with tips ultimately positioned within the superior aspect of the right atrium. Final catheter positioning was confirmed and documented with a spot radiographic image. The catheter aspirates and flushes normally. The catheter was flushed with appropriate volume heparin dwells. The catheter exit site was secured with a 0-Prolene retention suture. The venotomy incision was closed with an interrupted 4-0 Vicryl, Dermabond and Steri-strips. Dressings were applied. The patient tolerated the procedure well without immediate post procedural complication. IMPRESSION: Successful placement of 23 cm tip to cuff tunneled hemodialysis catheter via the right internal jugular vein with tips terminating within the superior aspect of the right atrium. The catheter is ready for immediate use. Electronically Signed   By: Jacqulynn Cadet M.D.   On: 03/31/2021 10:48   DG Chest Port 1 View  Result Date: 04/23/2021 CLINICAL DATA:  Shortness of breath EXAM: PORTABLE CHEST 1 VIEW COMPARISON:  Chest radiograph 04/18/2021 FINDINGS: A tunneled right-sided vascular catheter is in  stable position terminating in the lower SVC/cavoatrial junction. The cardiomediastinal silhouette is stable. There are small bilateral pleural effusions with adjacent airspace opacity in the lung bases, overall not  significantly changed compared to the prior study from 04/18/2021. The upper lungs remain well aerated. There is no pneumothorax. The bones are stable. IMPRESSION: Small bilateral pleural effusions with adjacent airspace opacity in the lung bases, overall not significantly changed since 04/18/2021. Electronically Signed   By: Valetta Mole M.D.   On: 04/23/2021 12:41   DG Chest Portable 1 View  Result Date: 04/06/2021 CLINICAL DATA:  Difficulty breathing EXAM: PORTABLE CHEST 1 VIEW COMPARISON:  03/27/2021 FINDINGS: Hazy opacities in the bilateral lower lungs, reflecting combination of small to moderate bilateral pleural effusions (right greater than left) and suspected lower lobe compressive atelectasis. No frank interstitial edema. The heart is normal in size.  Mild thoracic aortic atherosclerosis. Right IJ dialysis catheter terminates in the upper right atrium. IMPRESSION: Small to moderate bilateral pleural effusions (right greater than left) with suspected lower lobe compressive atelectasis. Electronically Signed   By: Julian Hy M.D.   On: 04/06/2021 02:23   VAS [place] UPPER EXT VEIN MAPPING (PRE-OP AVF)  Result Date: 04/02/2021 [place] MAPPING Patient Name:  Wayne Meadows  Date of Exam:   04/02/2021 Medical Rec #: [id number]         Accession #:    [id number] Date of Birth: 1934-07-07        Patient Gender: M Patient Age:   89 years Exam Location:  [facility] Procedure:      VAS [place] UPPER EXT VEIN MAPPING (PRE-OP AVF) Referring Phys: Dagoberto Ligas --------------------------------------------------------------------------------  Indications: Pre-access. Comparison Study: No previous exams Performing Technologist: Jody Hill RVT, RDMS  Examination Guidelines: A complete evaluation includes B-mode imaging, spectral Doppler, color Doppler, and power Doppler as needed of all accessible portions of each vessel. Bilateral testing is considered an integral part of a complete examination.  Limited examinations for reoccurring indications may be performed as noted. +-----------------+-------------+----------+--------------------------+  Right Cephalic    Diameter (cm) Depth (cm)          Findings           +-----------------+-------------+----------+--------------------------+  Shoulder              0.36         0.71                                +-----------------+-------------+----------+--------------------------+  Prox upper arm        0.38         0.63                                +-----------------+-------------+----------+--------------------------+  Mid upper arm         0.32         0.36                                +-----------------+-------------+----------+--------------------------+  Dist upper arm        0.31         0.34                                +-----------------+-------------+----------+--------------------------+  Antecubital fossa  0.45         0.30                                +-----------------+-------------+----------+--------------------------+  Prox forearm                               not visualized and IV/TAPE  +-----------------+-------------+----------+--------------------------+  Mid forearm           0.36         0.43                                +-----------------+-------------+----------+--------------------------+  Dist forearm          0.31         0.40                                +-----------------+-------------+----------+--------------------------+  Wrist                 0.25         0.51            branching           +-----------------+-------------+----------+--------------------------+ +-----------------+-------------+----------+---------+  Right Basilic     Diameter (cm) Depth (cm) Findings   +-----------------+-------------+----------+---------+  Prox upper arm        0.70                            +-----------------+-------------+----------+---------+  Mid upper arm         0.61                             +-----------------+-------------+----------+---------+  Dist upper arm        0.52                            +-----------------+-------------+----------+---------+  Antecubital fossa     0.58                            +-----------------+-------------+----------+---------+  Prox forearm          0.37                 branching  +-----------------+-------------+----------+---------+  Mid forearm           0.32                            +-----------------+-------------+----------+---------+  Distal forearm        0.27                            +-----------------+-------------+----------+---------+  Wrist                 0.20                            +-----------------+-------------+----------+---------+ +-----------------+-------------+----------+---------+  Left Cephalic     Diameter (cm) Depth (cm) Findings   +-----------------+-------------+----------+---------+  Shoulder  0.37         0.45               +-----------------+-------------+----------+---------+  Prox upper arm        0.27         0.28               +-----------------+-------------+----------+---------+  Mid upper arm         0.28         0.28               +-----------------+-------------+----------+---------+  Dist upper arm        0.32         0.48               +-----------------+-------------+----------+---------+  Antecubital fossa     0.39         0.53    branching  +-----------------+-------------+----------+---------+  Prox forearm          0.26         0.52               +-----------------+-------------+----------+---------+  Mid forearm           0.32         0.47               +-----------------+-------------+----------+---------+  Dist forearm          0.27         0.55               +-----------------+-------------+----------+---------+  Wrist                 0.33         0.45    branching  +-----------------+-------------+----------+---------+ +-----------------+-------------+----------+---------+  Left Basilic       Diameter (cm) Depth (cm) Findings   +-----------------+-------------+----------+---------+  Prox upper arm        0.56                            +-----------------+-------------+----------+---------+  Mid upper arm         0.55                            +-----------------+-------------+----------+---------+  Dist upper arm        0.58                            +-----------------+-------------+----------+---------+  Antecubital fossa     0.48                 branching  +-----------------+-------------+----------+---------+  Prox forearm          0.27                            +-----------------+-------------+----------+---------+  Mid forearm           0.27                            +-----------------+-------------+----------+---------+  Distal forearm        0.27                            +-----------------+-------------+----------+---------+  Wrist  0.32                            +-----------------+-------------+----------+---------+ *See table(s) above for measurements and observations.  Diagnosing physician: Harold Barban MD Electronically signed by Harold Barban MD on 04/02/2021 at 8:54:40 PM.    Final     Microbiology: Results for orders placed or performed during the hospital encounter of 04/18/21  Resp Panel by RT-PCR (Flu A&B, Covid) Nasopharyngeal Swab     Status: None   Collection Time: 04/18/21  6:08 PM   Specimen: Nasopharyngeal Swab; Nasopharyngeal(NP) swabs in vial transport medium  Result Value Ref Range Status   SARS Coronavirus 2 by RT PCR NEGATIVE NEGATIVE Final    Comment: (NOTE) SARS-CoV-2 target nucleic acids are NOT DETECTED.  The SARS-CoV-2 RNA is generally detectable in upper respiratory specimens during the acute phase of infection. The lowest concentration of SARS-CoV-2 viral copies this assay can detect is 138 copies/mL. A negative result does not preclude SARS-Cov-2 infection and should not be used as the sole basis for treatment or other patient  management decisions. A negative result may occur with  improper specimen collection/handling, submission of specimen other than nasopharyngeal swab, presence of viral mutation(s) within the areas targeted by this assay, and inadequate number of viral copies(<138 copies/mL). A negative result must be combined with clinical observations, patient history, and epidemiological information. The expected result is Negative.  Fact Sheet for Patients:  EntrepreneurPulse.com.au  Fact Sheet for Healthcare Providers:  IncredibleEmployment.be  This test is no t yet approved or cleared by the [place] FDA and  has been authorized for detection and/or diagnosis of SARS-CoV-2 by FDA under an Emergency Use Authorization (EUA). This EUA will remain  in effect (meaning this test can be used) for the duration of the COVID-19 declaration under Section 564(b)(1) of the Act, 21 U.S.C.section 360bbb-3(b)(1), unless the authorization is terminated  or revoked sooner.       Influenza A by PCR NEGATIVE NEGATIVE Final   Influenza B by PCR NEGATIVE NEGATIVE Final    Comment: (NOTE) The Xpert Xpress SARS-CoV-2/FLU/RSV plus assay is intended as an aid in the diagnosis of influenza from Nasopharyngeal swab specimens and should not be used as a sole basis for treatment. Nasal washings and aspirates are unacceptable for Xpert Xpress SARS-CoV-2/FLU/RSV testing.  Fact Sheet for Patients: EntrepreneurPulse.com.au  Fact Sheet for Healthcare Providers: IncredibleEmployment.be  This test is not yet approved or cleared by the [place] FDA and has been authorized for detection and/or diagnosis of SARS-CoV-2 by FDA under an Emergency Use Authorization (EUA). This EUA will remain in effect (meaning this test can be used) for the duration of the COVID-19 declaration under Section 564(b)(1) of the Act, 21 U.S.C. section 360bbb-3(b)(1),  unless the authorization is terminated or revoked.  Performed at [facility], [place] [address] [place], [place] [address]   Urine Culture     Status: Abnormal   Collection Time: 04/18/21  6:08 PM   Specimen: In/Out Cath Urine  Result Value Ref Range Status   Specimen Description IN/OUT CATH URINE  Final   Special Requests   Final    NONE Performed at [facility], [place] [address] [place], [place] [address]    Culture >=100,000 COLONIES/mL PSEUDOMONAS AERUGINOSA (A)  Final   Report Status 04/21/2021 FINAL  Final   Organism ID, Bacteria PSEUDOMONAS AERUGINOSA (A)  Final      Susceptibility  Pseudomonas aeruginosa - MIC*    CEFTAZIDIME 2 SENSITIVE Sensitive     CIPROFLOXACIN <=0.25 SENSITIVE Sensitive     GENTAMICIN <=1 SENSITIVE Sensitive     IMIPENEM 1 SENSITIVE Sensitive     PIP/TAZO <=4 SENSITIVE Sensitive     CEFEPIME 2 SENSITIVE Sensitive     * >=100,000 COLONIES/mL PSEUDOMONAS AERUGINOSA  Blood Culture (routine x 2)     Status: None   Collection Time: 04/18/21  6:20 PM   Specimen: BLOOD  Result Value Ref Range Status   Specimen Description BLOOD SITE NOT SPECIFIED  Final   Special Requests   Final    BOTTLES DRAWN AEROBIC AND ANAEROBIC Blood Culture results may not be optimal due to an excessive volume of blood received in culture bottles   Culture   Final    NO GROWTH 5 DAYS Performed at [facility], [place] [address] [place], [place] [address]    Report Status 04/23/2021 FINAL  Final  Blood Culture (routine x 2)     Status: None   Collection Time: 04/18/21  9:20 PM   Specimen: BLOOD  Result Value Ref Range Status   Specimen Description BLOOD BLOOD LEFT ARM  Final   Special Requests   Final    BOTTLES DRAWN AEROBIC AND ANAEROBIC Blood Culture adequate volume   Culture   Final    NO GROWTH 5 DAYS Performed at [facility], [place] [address] [place], [place] [address]    Report Status 04/23/2021 FINAL  Final    Labs: CBC: Recent Labs   Lab 04/18/21 1820 04/19/21 8563 04/19/21 0736 04/20/21 0341 04/21/21 0427 04/22/21 0414 04/23/21 0648  WBC 12.4* 9.2  --  8.1 8.8 8.8 7.6  NEUTROABS 9.3*  --   --   --   --   --   --   HGB 7.9* 6.6* 7.0* 8.1* 7.6* 8.6* 7.5*  HCT 25.2* 20.8* 21.5* 24.4* 23.4* 26.1* 23.7*  MCV 92.6 92.4  --  90.7 91.8 91.6 92.6  PLT 350 254  --  253 250 287 149   Basic Metabolic Panel: Recent Labs  Lab 04/18/21 2120 04/19/21 0619 04/20/21 0341 04/21/21 0427 04/22/21 0414 04/23/21 0648  NA  --  138 138 138 135 135  K  --  3.6 3.4* 3.7 4.2 3.4*  CL  --  100 101 102 98 100  CO2  --  27 27 28 28 26   GLUCOSE  --  91 95 108* 91 89  BUN  --  15 22 32* 18 29*  CREATININE  --  4.04* 5.18* 6.76* 4.64* 6.01*  CALCIUM  --  7.8* 7.9* 7.8* 8.2* 8.1*  MG 1.5*  --   --   --   --   --    Liver Function Tests: Recent Labs  Lab 04/18/21 1820 04/19/21 0619  AST 18 13*  ALT 7 6  ALKPHOS 84 65  BILITOT 0.3 0.2*  PROT 6.0* 5.1*  ALBUMIN 2.2* 1.8*   CBG: No results for input(s): GLUCAP in the last 168 hours.  Discharge time spent: greater than 30 minutes.  Signed: Alma Friendly, MD [facility] 04/23/2021

## 2021-04-23 NOTE — TOC Progression Note (Signed)
Transition of Care (TOC) - Initial/Assessment Note  ? ? ?Patient Details  ?Name: Wayne Meadows ?MRN: [id number] ?Date of Birth: [birth year] ? ?Transition of Care (TOC) CM/SW Contact:    ?Paulene Floor Tasfia Vasseur, LCSWA ?Phone Number: ?04/23/2021, 10:32 AM ? ?Clinical Narrative:                 ?CSW notified of possible d/c today and updated the facility and the patient's daughter.   ? ?Expected Discharge Plan: [place] ?Barriers to Discharge: Continued Medical Work up ? ? ?Patient Goals and CMS Choice ?  ?  ?  ? ?Expected Discharge Plan and Services ?Expected Discharge Plan: [place] ?  ?  ?  ?  ?                ?  ?  ?  ?  ?  ?  ?  ?  ?  ?  ? ?Prior Living Arrangements/Services ?  ?  ?  ?       ?  ?  ?  ?  ? ?Activities of Daily Living ?Home Assistive Devices/Equipment: Wheelchair ?ADL Screening (condition at time of admission) ?Patient's cognitive ability adequate to safely complete daily activities?: Yes ?Is the patient deaf or have difficulty hearing?: No ?Does the patient have difficulty seeing, even when wearing glasses/contacts?: No ?Does the patient have difficulty concentrating, remembering, or making decisions?: No ?Patient able to express need for assistance with ADLs?: Yes ?Does the patient have difficulty dressing or bathing?: No ?Independently performs ADLs?: No ?Communication: Independent ?Dressing (OT): Needs assistance ?Is this a change from baseline?: Pre-admission baseline ?Grooming: Needs assistance ?Is this a change from baseline?: Pre-admission baseline ?Feeding: Independent ?Bathing: Needs assistance ?Is this a change from baseline?: Pre-admission baseline ?Toileting: Needs assistance ?Is this a change from baseline?: Pre-admission baseline ?In/Out Bed: Needs assistance ?Is this a change from baseline?: Pre-admission baseline ?Walks in Home: Needs assistance ?Is this a change from baseline?: Pre-admission baseline ?Does the patient have difficulty walking or climbing  stairs?: Yes ?Weakness of Legs: Both ?Weakness of Arms/Hands: None ? ?Permission Sought/Granted ?  ?  ?   ?   ?   ?   ? ?Emotional Assessment ?  ?  ?  ?  ?  ?  ? ?Admission diagnosis:  Arteriovenous fistula infection ([place]) [T82.7XXA] ?Sepsis ([place]) [A41.9] ?Patient Active Problem List  ? Diagnosis Date Noted  ? Arteriovenous fistula infection ([place]) 04/18/2021  ? Gastric ulcer without hemorrhage or perforation   ? Duodenal ulcer   ? Elevated INR   ? Acute upper GI bleed 04/06/2021  ? ESRD (end stage renal disease) on dialysis ([place]) 04/06/2021  ? Supratherapeutic INR 04/06/2021  ? Proteinuria 03/07/2021  ? AKI (acute kidney injury) ([place]) 03/05/2021  ? Hypothyroidism 03/05/2021  ? Anxiety 03/05/2021  ? Recurrent cellulitis of lower leg 03/04/2021  ? Chronic pancreatitis ([place]) 12/24/2020  ? Nausea and vomiting in adult 06/22/2020  ? Esophageal stricture   ? Hyperbilirubinemia 04/11/2016  ? Elevated lactic acid level   ? Segmental colitis ([place]) 09/04/2015  ? Noninfectious gastroenteritis, unspecified   ? Benign neoplasm of transverse colon   ? Benign neoplasm of colon   ? Bright red blood per rectum   ? Dysphagia   ? Renal cell cancer ([place])   ? Essential hypertension   ? Centrilobular emphysema ([place])   ? Blood in stool 08/19/2015  ? Rectal bleeding 08/19/2015  ? CAP (community acquired pneumonia) 04/08/2014  ? Overweight (BMI 25.0-29.9)  04/08/2014  ? Healthcare-associated pneumonia 10/26/2011  ? Cholelithiasis 04/26/2011  ? Atrial fibrillation ([place]) 04/26/2011  ? Chest pain 03/19/2011  ? Unstable angina ([place]) 03/18/2011  ?  Class: Acute  ? Lung nodule 03/02/2011  ? Liver lesion 03/02/2011  ? Other hyperlipidemia 03/02/2011  ? Abnormal CT of the abdomen 02/23/2011  ? Non Q wave myocardial infarction ([place]) 02/22/2011  ?  Class: Acute  ? History of CVA (cerebrovascular accident) 02/22/2011  ? CAD (coronary artery disease) 02/22/2011  ? COPD (chronic obstructive pulmonary disease) ([place]) 02/22/2011  ? Tobacco abuse, in  remission 02/22/2011  ? ?PCP:  [name] ?Pharmacy:  No Pharmacies Listed ? ? ? ?Social Determinants of Health (SDOH) Interventions ?  ? ?Readmission Risk Interventions ?Readmission Risk Prevention Plan 03/21/2021 03/19/2021  ?Transportation Screening Complete -  ?[place] or Home Care Consult Complete -  ?SW Recovery Care/Counseling Consult Complete Complete  ?Skilled Nursing Facility Complete Complete  ?Some recent data might be hidden  ? ? ? ?

## 2021-04-24 DIAGNOSIS — I129 Hypertensive chronic kidney disease with stage 1 through stage 4 chronic kidney disease, or unspecified chronic kidney disease: Secondary | ICD-10-CM | POA: Diagnosis not present

## 2021-04-24 DIAGNOSIS — R5383 Other fatigue: Secondary | ICD-10-CM | POA: Diagnosis not present

## 2021-04-24 DIAGNOSIS — N139 Obstructive and reflux uropathy, unspecified: Secondary | ICD-10-CM | POA: Diagnosis not present

## 2021-04-24 DIAGNOSIS — T827XXD Infection and inflammatory reaction due to other cardiac and vascular devices, implants and grafts, subsequent encounter: Secondary | ICD-10-CM | POA: Diagnosis not present

## 2021-04-24 DIAGNOSIS — I639 Cerebral infarction, unspecified: Secondary | ICD-10-CM | POA: Diagnosis not present

## 2021-04-24 DIAGNOSIS — L03119 Cellulitis of unspecified part of limb: Secondary | ICD-10-CM | POA: Diagnosis not present

## 2021-04-24 DIAGNOSIS — L299 Pruritus, unspecified: Secondary | ICD-10-CM | POA: Diagnosis not present

## 2021-04-24 DIAGNOSIS — E782 Mixed hyperlipidemia: Secondary | ICD-10-CM | POA: Diagnosis not present

## 2021-04-24 DIAGNOSIS — K861 Other chronic pancreatitis: Secondary | ICD-10-CM | POA: Diagnosis not present

## 2021-04-24 DIAGNOSIS — I120 Hypertensive chronic kidney disease with stage 5 chronic kidney disease or end stage renal disease: Secondary | ICD-10-CM | POA: Diagnosis not present

## 2021-04-24 DIAGNOSIS — L89133 Pressure ulcer of right lower back, stage 3: Secondary | ICD-10-CM | POA: Diagnosis not present

## 2021-04-24 DIAGNOSIS — E039 Hypothyroidism, unspecified: Secondary | ICD-10-CM | POA: Diagnosis not present

## 2021-04-24 DIAGNOSIS — I10 Essential (primary) hypertension: Secondary | ICD-10-CM | POA: Diagnosis not present

## 2021-04-24 DIAGNOSIS — L89134 Pressure ulcer of right lower back, stage 4: Secondary | ICD-10-CM | POA: Diagnosis not present

## 2021-04-24 DIAGNOSIS — I679 Cerebrovascular disease, unspecified: Secondary | ICD-10-CM | POA: Diagnosis not present

## 2021-04-24 DIAGNOSIS — M6281 Muscle weakness (generalized): Secondary | ICD-10-CM | POA: Diagnosis not present

## 2021-04-24 DIAGNOSIS — L97819 Non-pressure chronic ulcer of other part of right lower leg with unspecified severity: Secondary | ICD-10-CM | POA: Diagnosis not present

## 2021-04-24 DIAGNOSIS — I739 Peripheral vascular disease, unspecified: Secondary | ICD-10-CM | POA: Diagnosis not present

## 2021-04-24 DIAGNOSIS — A499 Bacterial infection, unspecified: Secondary | ICD-10-CM | POA: Diagnosis not present

## 2021-04-24 DIAGNOSIS — N186 End stage renal disease: Secondary | ICD-10-CM | POA: Diagnosis not present

## 2021-04-24 DIAGNOSIS — Z992 Dependence on renal dialysis: Secondary | ICD-10-CM | POA: Diagnosis not present

## 2021-04-24 DIAGNOSIS — K5900 Constipation, unspecified: Secondary | ICD-10-CM | POA: Diagnosis not present

## 2021-04-24 DIAGNOSIS — E1122 Type 2 diabetes mellitus with diabetic chronic kidney disease: Secondary | ICD-10-CM | POA: Diagnosis not present

## 2021-04-24 DIAGNOSIS — N390 Urinary tract infection, site not specified: Secondary | ICD-10-CM | POA: Diagnosis not present

## 2021-04-24 DIAGNOSIS — F419 Anxiety disorder, unspecified: Secondary | ICD-10-CM | POA: Diagnosis not present

## 2021-04-24 DIAGNOSIS — R1084 Generalized abdominal pain: Secondary | ICD-10-CM | POA: Diagnosis not present

## 2021-04-24 DIAGNOSIS — R278 Other lack of coordination: Secondary | ICD-10-CM | POA: Diagnosis not present

## 2021-04-24 DIAGNOSIS — A419 Sepsis, unspecified organism: Secondary | ICD-10-CM | POA: Diagnosis not present

## 2021-04-24 DIAGNOSIS — R109 Unspecified abdominal pain: Secondary | ICD-10-CM | POA: Diagnosis present

## 2021-04-24 DIAGNOSIS — D631 Anemia in chronic kidney disease: Secondary | ICD-10-CM | POA: Diagnosis not present

## 2021-04-24 DIAGNOSIS — Z7982 Long term (current) use of aspirin: Secondary | ICD-10-CM | POA: Diagnosis not present

## 2021-04-24 DIAGNOSIS — Z23 Encounter for immunization: Secondary | ICD-10-CM | POA: Diagnosis not present

## 2021-04-24 DIAGNOSIS — Z743 Need for continuous supervision: Secondary | ICD-10-CM | POA: Diagnosis not present

## 2021-04-24 DIAGNOSIS — R262 Difficulty in walking, not elsewhere classified: Secondary | ICD-10-CM | POA: Diagnosis not present

## 2021-04-24 DIAGNOSIS — J439 Emphysema, unspecified: Secondary | ICD-10-CM | POA: Diagnosis not present

## 2021-04-24 DIAGNOSIS — J449 Chronic obstructive pulmonary disease, unspecified: Secondary | ICD-10-CM | POA: Diagnosis not present

## 2021-04-24 DIAGNOSIS — R41841 Cognitive communication deficit: Secondary | ICD-10-CM | POA: Diagnosis not present

## 2021-04-24 DIAGNOSIS — R2681 Unsteadiness on feet: Secondary | ICD-10-CM | POA: Diagnosis not present

## 2021-04-24 DIAGNOSIS — N2581 Secondary hyperparathyroidism of renal origin: Secondary | ICD-10-CM | POA: Diagnosis not present

## 2021-04-24 DIAGNOSIS — I2510 Atherosclerotic heart disease of native coronary artery without angina pectoris: Secondary | ICD-10-CM | POA: Diagnosis not present

## 2021-04-24 DIAGNOSIS — D509 Iron deficiency anemia, unspecified: Secondary | ICD-10-CM | POA: Diagnosis not present

## 2021-04-24 DIAGNOSIS — F29 Unspecified psychosis not due to a substance or known physiological condition: Secondary | ICD-10-CM | POA: Diagnosis not present

## 2021-04-24 DIAGNOSIS — D638 Anemia in other chronic diseases classified elsewhere: Secondary | ICD-10-CM | POA: Diagnosis not present

## 2021-04-24 DIAGNOSIS — K259 Gastric ulcer, unspecified as acute or chronic, without hemorrhage or perforation: Secondary | ICD-10-CM | POA: Diagnosis not present

## 2021-04-24 LAB — BASIC METABOLIC PANEL
Anion gap: 12 (ref 5–15)
BUN: 17 mg/dL (ref 8–23)
CO2: 26 mmol/L (ref 22–32)
Calcium: 8 mg/dL — ABNORMAL LOW (ref 8.9–10.3)
Chloride: 100 mmol/L (ref 98–111)
Creatinine, Ser: 3.98 mg/dL — ABNORMAL HIGH (ref 0.61–1.24)
GFR, Estimated: 14 mL/min — ABNORMAL LOW (ref >60)
Glucose, Bld: 92 mg/dL (ref 70–99)
Potassium: 3.7 mmol/L (ref 3.5–5.1)
Sodium: 138 mmol/L (ref 135–145)

## 2021-04-24 LAB — CBC
HCT: 26.6 % — ABNORMAL LOW (ref 39.0–52.0)
Hemoglobin: 8.5 g/dL — ABNORMAL LOW (ref 13.0–17.0)
MCH: 29.6 pg (ref 26.0–34.0)
MCHC: 32 g/dL (ref 30.0–36.0)
MCV: 92.7 fL (ref 80.0–100.0)
Platelets: 245 10*3/uL (ref 150–400)
RBC: 2.87 MIL/uL — ABNORMAL LOW (ref 4.22–5.81)
RDW: 15.8 % — ABNORMAL HIGH (ref 11.5–15.5)
WBC: 7.6 10*3/uL (ref 4.0–10.5)
nRBC: 0 % (ref 0.0–0.2)

## 2021-04-24 LAB — HEPATITIS B SURFACE ANTIGEN: Hepatitis B Surface Ag: NONREACTIVE

## 2021-04-24 MED ORDER — LIDOCAINE-PRILOCAINE 2.5-2.5 % EX CREA: 1.0000 "application " | TOPICAL_CREAM | CUTANEOUS | Status: DC | PRN

## 2021-04-24 MED ORDER — HEPARIN SODIUM (PORCINE) 1000 UNIT/ML DIALYSIS: 1000.0000 [IU] | INTRAMUSCULAR | Status: DC | PRN

## 2021-04-24 MED ORDER — LIDOCAINE HCL (PF) 1 % IJ SOLN: 5.0000 mL | INTRAMUSCULAR | Status: DC | PRN

## 2021-04-24 MED ORDER — CLONIDINE HCL 0.1 MG PO TABS
0.1000 mg | ORAL_TABLET | Freq: Every day | ORAL | Status: DC
Start: 2021-04-24 — End: 2021-04-25
  Administered 2021-04-24: 0.1 mg via ORAL
  Filled 2021-04-24: qty 1

## 2021-04-24 MED ORDER — ALTEPLASE 2 MG IJ SOLR: 2.0000 mg | Freq: Once | INTRAMUSCULAR | Status: DC | PRN

## 2021-04-24 MED ORDER — HYDRALAZINE HCL 10 MG PO TABS: 10.0000 mg | ORAL_TABLET | Freq: Three times a day (TID) | ORAL | Status: DC | PRN

## 2021-04-24 MED ORDER — SODIUM CHLORIDE 0.9 % IV SOLN: 100.0000 mL | INTRAVENOUS | Status: DC | PRN

## 2021-04-24 MED ORDER — PENTAFLUOROPROP-TETRAFLUOROETH EX AERO: 1.0000 "application " | INHALATION_SPRAY | CUTANEOUS | Status: DC | PRN

## 2021-04-24 NOTE — Progress Notes (Addendum)
?[facility] ?Progress Note  ? ?Subjective:    ?Seen and examined patient at bedside. He reports not sleeping very well at night. Denies SOB, CP, and N/V. Tolerated yesterday's HD with net UF 2L. Possible dc to SNF today. Plan for HD 3/3 if patient is still here. ? ?Objective ?Vitals:  ? 04/23/21 2115 04/24/21 0532 04/24/21 0804 04/24/21 1014  ?BP:  (!) 180/60  (!) 175/60  ?Pulse: 100 92  (!) 101  ?Resp: 18 18  18   ?Temp:  98.5 ?F (36.9 ?C)  98.7 ?F (37.1 ?C)  ?TempSrc:  Oral  Oral  ?SpO2:  93% 91% 93%  ?Weight:  78 kg    ?Height:      ? ?Physical Exam ?General: NAD; Lying in bed ?Heart: S1 and S2; No murmurs, gallops, or rubs ?Lungs: mild congestion with expiratory wheezing bilaterally ?Abdomen: round and distended ?Extremities: no edema BLLE; noted ace-wrap at LEs ?Dialysis Access: R IJ TDC; RUE AVF in ace-wrap (wound photo visualized) ? ?Filed Weights  ? 04/23/21 0550 04/23/21 0710 04/24/21 0532  ?Weight: 78.5 kg 78.5 kg 78 kg  ? ? ?Intake/Output Summary (Last 24 hours) at 04/24/2021 1049 ?Last data filed at 04/24/2021 0900 ?Gross per 24 hour  ?Intake 714 ml  ?Output 125 ml  ?Net 589 ml  ? ? ?Additional Objective ?Labs: ?Basic Metabolic Panel: ?Recent Labs  ?Lab 04/22/21 ?0414 04/23/21 ?2671 04/24/21 ?0413  ?NA 135 135 138  ?K 4.2 3.4* 3.7  ?CL 98 100 100  ?CO2 28 26 26   ?GLUCOSE 91 89 92  ?BUN 18 29* 17  ?CREATININE 4.64* 6.01* 3.98*  ?CALCIUM 8.2* 8.1* 8.0*  ? ?Liver Function Tests: ?Recent Labs  ?Lab 04/18/21 ?1820 04/19/21 ?2458  ?AST 18 13*  ?ALT 7 6  ?ALKPHOS 84 65  ?BILITOT 0.3 0.2*  ?PROT 6.0* 5.1*  ?ALBUMIN 2.2* 1.8*  ? ?No results for input(s): LIPASE, AMYLASE in the last 168 hours. ?CBC: ?Recent Labs  ?Lab 04/18/21 ?1820 04/19/21 ?0998 04/20/21 ?3382 04/21/21 ?5053 04/22/21 ?9767 04/23/21 ?3419 04/24/21 ?0413  ?WBC 12.4*   < > 8.1 8.8 8.8 7.6 7.6  ?NEUTROABS 9.3*  --   --   --   --   --   --   ?HGB 7.9*   < > 8.1* 7.6* 8.6* 7.5* 8.5*  ?HCT 25.2*   < > 24.4* 23.4* 26.1* 23.7* 26.6*  ?MCV  92.6   < > 90.7 91.8 91.6 92.6 92.7  ?PLT 350   < > 253 250 287 245 245  ? < > = values in this interval not displayed.  ? ?Blood Culture ?   ?Component Value Date/Time  ? SDES BLOOD BLOOD LEFT ARM 04/18/2021 2120  ? SPECREQUEST  04/18/2021 2120  ?  BOTTLES DRAWN AEROBIC AND ANAEROBIC Blood Culture adequate volume  ? CULT  04/18/2021 2120  ?  NO GROWTH 5 DAYS ?Performed at [facility], [place] [address] [place], [place] [address] ?  ? REPTSTATUS 04/23/2021 FINAL 04/18/2021 2120  ? ? ?Cardiac Enzymes: ?No results for input(s): CKTOTAL, CKMB, CKMBINDEX, TROPONINI in the last 168 hours. ?CBG: ?No results for input(s): GLUCAP in the last 168 hours. ?Iron Studies: No results for input(s): IRON, TIBC, TRANSFERRIN, FERRITIN in the last 72 hours. ?Lab Results  ?Component Value Date  ? INR 1.1 04/18/2021  ? INR 1.2 04/09/2021  ? INR 1.3 (H) 04/08/2021  ? ?Studies/Results: ?DG Chest Port 1 View ? ?Result Date: 04/23/2021 ?CLINICAL DATA:  Shortness of breath EXAM:  PORTABLE CHEST 1 VIEW COMPARISON:  Chest radiograph 04/18/2021 FINDINGS: A tunneled right-sided vascular catheter is in stable position terminating in the lower SVC/cavoatrial junction. The cardiomediastinal silhouette is stable. There are small bilateral pleural effusions with adjacent airspace opacity in the lung bases, overall not significantly changed compared to the prior study from 04/18/2021. The upper lungs remain well aerated. There is no pneumothorax. The bones are stable. IMPRESSION: Small bilateral pleural effusions with adjacent airspace opacity in the lung bases, overall not significantly changed since 04/18/2021. Electronically Signed   By: Valetta Mole M.D.   On: 04/23/2021 12:41   ? ?Medications: ? ? (feeding supplement) PROSource Plus  30 mL Oral BID BM  ? Chlorhexidine Gluconate Cloth  6 each Topical Q0600  ? cloNIDine  0.1 mg Oral Daily  ? darbepoetin (ARANESP) injection - DIALYSIS  60 mcg Intravenous Q Mon-HD  ? diltiazem  180 mg Oral Daily  ?  feeding supplement (NEPRO CARB STEADY)  237 mL Oral TID AC  ? ipratropium-albuterol  3 mL Nebulization BID  ? lanthanum  1,000 mg Oral TID WC  ? levothyroxine  50 mcg Oral QAC breakfast  ? naphazoline-glycerin  1-2 drop Right Eye TID  ? pantoprazole  40 mg Oral BID  ? pravastatin  40 mg Oral QHS  ? sodium chloride flush  3 mL Intravenous Q12H  ? ? ?Dialysis Orders: ?MWF [facility] 3h 300/500 83kg 2K 2.25Ca  ?Delia , RU AVF maturing ?Hep 3000  ?mircera 50q2wks (not yet given)  ? ?Assessment/Plan: ?Infected RUE wound - wound culture and blood culture taken at OP dialysis center- CONS and yeast from 2/24.  BC collected here as well-from 2/24 (-) X 5 days. ABX d/c'd 2/27. Pt completed 3 days ceftazidime/cefepime. Wound packed by VVS with daily dressing changes.   ?ESRD -  on HD MWF. Tolerated yesterday's HD with net UF 2L ?Hypertension/volume  - BP elevated, UF goal increased yesterday, Push UF, will lower EDW on d/c. ?Acute on chronic Anemia of CKD - recent admit for GIB. Now 8.5 s/p 1 unit pRBC.  ESA with HD  Transfuse prn. ?Secondary Hyperparathyroidism -  Corrected Ca in goal.  Not on VDRA or binders.    ?Nutrition - Alb 1.8 - on protein supplements.  Renal diet w/fluid restrictions.  ?Hx CVA ?Hx COPD on 2L O2 at baseline ?Dispo: Ok to go from renal perspective. Possible dc to SNF today. Plan for HD 3/3 if patient is still here. ? ?Tobie Poet, NP ?[facility] ?04/24/2021,10:49 AM ? LOS: 5 days  ?  ?

## 2021-04-24 NOTE — Progress Notes (Signed)
Physical Therapy Treatment ?Patient Details ?Name: Wayne Meadows ?MRN: [id number] ?DOB: 06-05-1934 ?Today's Date: 04/24/2021 ? ? ?History of Present Illness 87 y/o male presented to ED from SNF on 2/12 for vomiting x 2 weeks and vomiting blood today with dark coffee ground diarrhea. EGD found 2 superficial gastric ulcers and 3 clean-based duodenal ulcers. Recently admitted 1/23-2/10 for stage IV with persistent LE erythma and fluid overload. PMH: CAD, COPD, HTN, CVA, renal carcinoma ? ?  ?PT Comments  ? ? Pt reluctant but eventually agreeable to mobilize. Pt stating he couldn't stand because he was too weak after being in the bed. Explained that is why we were working with him. Used Stedy to stand with pt to give him reassurance/confidence that he could stand. Recommend return to SNF for further therapy.    ?Recommendations for follow up therapy are one component of a multi-disciplinary discharge planning process, led by the attending physician.  Recommendations may be updated based on patient status, additional functional criteria and insurance authorization. ? ?Follow Up Recommendations ? Skilled nursing-short term rehab (<3 hours/day) ?  ?  ?Assistance Recommended at Discharge Frequent or constant Supervision/Assistance  ?Patient can return home with the following A little help with walking and/or transfers;A little help with bathing/dressing/bathroom;Assistance with cooking/housework;Assist for transportation;Help with stairs or ramp for entrance ?  ?Equipment Recommendations ? Other (comment) (defer)  ?  ?Recommendations for Other Services   ? ? ?  ?Precautions / Restrictions Precautions ?Precautions: Fall ?Restrictions ?Weight Bearing Restrictions: No  ?  ? ?Mobility ? Bed Mobility ?Overal bed mobility: Needs Assistance ?Bed Mobility: Supine to Sit, Sit to Supine ?  ?  ?Supine to sit: Supervision ?Sit to supine: Supervision ?  ?General bed mobility comments: no physical assist required ?  ? ?Transfers ?Overall  transfer level: Needs assistance ?Equipment used: Ambulation equipment used ?Transfers: Sit to/from Stand ?Sit to Stand: Mod assist ?  ?  ?  ?  ?  ?General transfer comment: Assist to bring hips up stand x 2 using Stedy. Used stedy due to pt's hesitation to attempt mobility. ?  ? ?Ambulation/Gait ?  ?  ?  ?  ?  ?  ?  ?  ? ? ?Stairs ?  ?  ?  ?  ?  ? ? ?Wheelchair Mobility ?  ? ?Modified Rankin (Stroke Patients Only) ?  ? ? ?  ?Balance Overall balance assessment: Needs assistance ?Sitting-balance support: Feet supported ?Sitting balance-Leahy Scale: Good ?  ?  ?  ?  ?  ?  ?  ?  ?  ?  ?  ?  ?  ?  ?  ?  ?  ? ?  ?Cognition Arousal/Alertness: Awake/alert ?Behavior During Therapy: [facility] for tasks assessed/performed ?Overall Cognitive Status: No family/caregiver present to determine baseline cognitive functioning ?  ?  ?  ?  ?  ?  ?  ?  ?  ?  ?  ?  ?  ?  ?  ?  ?General Comments: Pt self limiting ?  ?  ? ?  ?Exercises   ? ?  ?General Comments   ?  ?  ? ?Pertinent Vitals/Pain Pain Assessment ?Pain Assessment: Faces ?Faces Pain Scale: Hurts a little bit ?Pain Location: generalized ?Pain Descriptors / Indicators: Grimacing ?Pain Intervention(s): Limited activity within patient's tolerance  ? ? ?Home Living   ?  ?  ?  ?  ?  ?  ?  ?  ?  ?   ?  ?Prior Function    ?  ?  ?   ? ?  PT Goals (current goals can now be found in the care plan section) Acute Rehab PT Goals ?Patient Stated Goal: to finish rehab for home ?Progress towards PT goals: Progressing toward goals ? ?  ?Frequency ? ? ? Min 2X/week ? ? ? ?  ?PT Plan Current plan remains appropriate  ? ? ?Co-evaluation   ?  ?  ?  ?  ? ?  ?AM-PAC PT "6 Clicks" Mobility   ?Outcome Measure ? Help needed turning from your back to your side while in a flat bed without using bedrails?: A Little ?Help needed moving from lying on your back to sitting on the side of a flat bed without using bedrails?: A Little ?Help needed moving to and from a bed to a chair (including a wheelchair)?: A Lot ?Help  needed standing up from a chair using your arms (e.g., wheelchair or bedside chair)?: A Lot ?Help needed to walk in hospital room?: Total ?Help needed climbing 3-5 steps with a railing? : Total ?6 Click Score: 12 ? ?  ?End of Session   ?Activity Tolerance: Other (comment) (self limiting) ?Patient left: in bed;with call bell/phone within reach;with bed alarm set ?Nurse Communication: Mobility status;Need for lift equipment ?PT Visit Diagnosis: Difficulty in walking, not elsewhere classified (R26.2);Muscle weakness (generalized) (M62.81);Unsteadiness on feet (R26.81);Other abnormalities of gait and mobility (R26.89) ?Pain - Right/Left: Right ?Pain - part of body: Arm ?  ? ? ?Time: 0233-4356 ?PT Time Calculation (min) (ACUTE ONLY): 13 min ? ?Charges:  $Therapeutic Activity: 8-22 mins          ?          ? ?[facility] PT ?Acute Rehabilitation Services ?Pager [phone] ?Office [phone] ? ? ? ?Shary Decamp [facility] ?04/24/2021, 2:04 PM ? ?

## 2021-04-24 NOTE — Progress Notes (Signed)
Bladder scan showed 38ml. Patient havent urinated yet. Per patient, he didn't drink much and he does pee alot ?

## 2021-04-24 NOTE — TOC Progression Note (Signed)
Transition of Care (TOC) - Initial/Assessment Note  ? ? ?Patient Details  ?Name: Wayne Meadows ?MRN: [id number] ?Date of Birth: [birth year] ? ?Transition of Care (TOC) CM/SW Contact:    ?Paulene Floor Jatorian Renault, LCSWA ?Phone Number: ?04/24/2021, 10:14 AM ? ?Clinical Narrative:                 ?CSW contacted admissions with [facility] and informed the facility of the possibility of the patient discharging today if he voids.   ? ?Expected Discharge Plan: [place] ?Barriers to Discharge: Barriers Resolved ? ? ?Patient Goals and CMS Choice ?  ?  ?  ? ?Expected Discharge Plan and Services ?Expected Discharge Plan: [place] ?  ?  ?  ?  ?Expected Discharge Date: 04/23/21               ?  ?  ?  ?  ?  ?  ?  ?  ?  ?  ? ?Prior Living Arrangements/Services ?  ?  ?  ?       ?  ?  ?  ?  ? ?Activities of Daily Living ?Home Assistive Devices/Equipment: Wheelchair ?ADL Screening (condition at time of admission) ?Patient's cognitive ability adequate to safely complete daily activities?: Yes ?Is the patient deaf or have difficulty hearing?: No ?Does the patient have difficulty seeing, even when wearing glasses/contacts?: No ?Does the patient have difficulty concentrating, remembering, or making decisions?: No ?Patient able to express need for assistance with ADLs?: Yes ?Does the patient have difficulty dressing or bathing?: No ?Independently performs ADLs?: No ?Communication: Independent ?Dressing (OT): Needs assistance ?Is this a change from baseline?: Pre-admission baseline ?Grooming: Needs assistance ?Is this a change from baseline?: Pre-admission baseline ?Feeding: Independent ?Bathing: Needs assistance ?Is this a change from baseline?: Pre-admission baseline ?Toileting: Needs assistance ?Is this a change from baseline?: Pre-admission baseline ?In/Out Bed: Needs assistance ?Is this a change from baseline?: Pre-admission baseline ?Walks in Home: Needs assistance ?Is this a change from baseline?:  Pre-admission baseline ?Does the patient have difficulty walking or climbing stairs?: Yes ?Weakness of Legs: Both ?Weakness of Arms/Hands: None ? ?Permission Sought/Granted ?  ?  ?   ?   ?   ?   ? ?Emotional Assessment ?  ?  ?  ?  ?  ?  ? ?Admission diagnosis:  Arteriovenous fistula infection ([place]) [T82.7XXA] ?Sepsis ([place]) [A41.9] ?Patient Active Problem List  ? Diagnosis Date Noted  ? Arteriovenous fistula infection ([place]) 04/18/2021  ? Gastric ulcer without hemorrhage or perforation   ? Duodenal ulcer   ? Elevated INR   ? Acute upper GI bleed 04/06/2021  ? ESRD (end stage renal disease) on dialysis ([place]) 04/06/2021  ? Supratherapeutic INR 04/06/2021  ? Proteinuria 03/07/2021  ? AKI (acute kidney injury) ([place]) 03/05/2021  ? Hypothyroidism 03/05/2021  ? Anxiety 03/05/2021  ? Recurrent cellulitis of lower leg 03/04/2021  ? Chronic pancreatitis ([place]) 12/24/2020  ? Nausea and vomiting in adult 06/22/2020  ? Esophageal stricture   ? Hyperbilirubinemia 04/11/2016  ? Elevated lactic acid level   ? Segmental colitis ([place]) 09/04/2015  ? Noninfectious gastroenteritis, unspecified   ? Benign neoplasm of transverse colon   ? Benign neoplasm of colon   ? Bright red blood per rectum   ? Dysphagia   ? Renal cell cancer ([place])   ? Essential hypertension   ? Centrilobular emphysema ([place])   ? Blood in stool 08/19/2015  ? Rectal bleeding 08/19/2015  ? CAP (community acquired pneumonia)  04/08/2014  ? Overweight (BMI 25.0-29.9) 04/08/2014  ? Healthcare-associated pneumonia 10/26/2011  ? Cholelithiasis 04/26/2011  ? Atrial fibrillation ([place]) 04/26/2011  ? Chest pain 03/19/2011  ? Unstable angina ([place]) 03/18/2011  ?  Class: Acute  ? Lung nodule 03/02/2011  ? Liver lesion 03/02/2011  ? Other hyperlipidemia 03/02/2011  ? Abnormal CT of the abdomen 02/23/2011  ? Non Q wave myocardial infarction ([place]) 02/22/2011  ?  Class: Acute  ? History of CVA (cerebrovascular accident) 02/22/2011  ? CAD (coronary artery disease) 02/22/2011  ? COPD (chronic  obstructive pulmonary disease) ([place]) 02/22/2011  ? Tobacco abuse, 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/22/2011  ? ?PCP:  [name] ?Pharmacy:  No Pharmacies Listed ? ? ? ?Social Determinants of Health (SDOH) Interventions ?  ? ?Readmission Risk Interventions ?Readmission Risk Prevention Plan 03/21/2021 03/19/2021  ?Transportation Screening Complete -  ?[place] or Home Care Consult Complete -  ?SW Recovery Care/Counseling Consult Complete Complete  ?Skilled Nursing Facility Complete Complete  ?Some recent data might be hidden  ? ? ? ?

## 2021-04-24 NOTE — Progress Notes (Signed)
Patient havent urinated since foley catheter discontinued at 5pm. Bladder scan performed showed 150cc. Oncall night MD Bridgett Larsson made aware. Will continue to monitor patient. ?

## 2021-04-24 NOTE — Progress Notes (Signed)
Contacted [facility] to make them aware pt did not d/c yesterday as planned but to d/c today. Clinic aware to expect pt at clinic tomorrow.  ? ?Melven Sartorius ?Renal Navigator ?[phone] ?

## 2021-04-24 NOTE — TOC Transition Note (Signed)
Transition of Care (TOC) - CM/SW Discharge Note ? ? ?Patient Details  ?Name: Wayne Meadows ?MRN: [id number] ?Date of Birth: 08/20/1934 ? ?Transition of Care (TOC) CM/SW Contact:  ?Paulene Floor Johniece Hornbaker, LCSWA ?Phone Number: ?04/24/2021, 2:54 PM ? ? ?Clinical Narrative:    ?Patient discharging today.  See transition note from 3/1. ? ?Final next level of care: [place] ?Barriers to Discharge: Barriers Resolved ? ? ?Patient Goals and CMS Choice ?  ?  ?  ? ?Discharge Placement ?  ?           ?Patient chooses bed at: [facility] ?Patient to be transferred to facility by: [facility] ?Name of family member notified: Lyna Poser (Daughter)   [phone] ?Patient and family notified of of transfer: 04/23/21 ? ?Discharge Plan and Services ?  ?  ?           ?  ?  ?  ?  ?  ?  ?  ?  ?  ?  ? ?Social Determinants of Health (SDOH) Interventions ?  ? ? ?Readmission Risk Interventions ?Readmission Risk Prevention Plan 03/21/2021 03/19/2021  ?Transportation Screening Complete -  ?[place] or Home Care Consult Complete -  ?SW Recovery Care/Counseling Consult Complete Complete  ?Skilled Nursing Facility Complete Complete  ?Some recent data might be hidden  ? ? ? ? ? ?

## 2021-04-24 NOTE — Progress Notes (Signed)
?  Progress Note ? ? ? ?04/24/2021 ?7:44 AM ? ? ?Subjective:  no complaints.   ? ?afebrile ? ?Vitals:  ? 04/23/21 2115 04/24/21 0532  ?BP:  (!) 180/60  ?Pulse: 100 92  ?Resp: 18 18  ?Temp:  98.5 ?F (36.9 ?C)  ?SpO2:  93%  ? ? ?Physical Exam: ?General:  no distress ?Lungs:  non labored ?Incisions:   ? ? ? ?CBC ?   ?Component Value Date/Time  ? WBC 7.6 04/24/2021 0413  ? RBC 2.87 (L) 04/24/2021 0413  ? HGB 8.5 (L) 04/24/2021 0413  ? HCT 26.6 (L) 04/24/2021 0413  ? PLT 245 04/24/2021 0413  ? MCV 92.7 04/24/2021 0413  ? MCH 29.6 04/24/2021 0413  ? MCHC 32.0 04/24/2021 0413  ? RDW 15.8 (H) 04/24/2021 0413  ? LYMPHSABS 1.7 04/18/2021 1820  ? MONOABS 1.1 (H) 04/18/2021 1820  ? EOSABS 0.2 04/18/2021 1820  ? BASOSABS 0.1 04/18/2021 1820  ? ? ?BMET ?   ?Component Value Date/Time  ? NA 138 04/24/2021 0413  ? K 3.7 04/24/2021 0413  ? CL 100 04/24/2021 0413  ? CO2 26 04/24/2021 0413  ? GLUCOSE 92 04/24/2021 0413  ? BUN 17 04/24/2021 0413  ? CREATININE 3.98 (H) 04/24/2021 0413  ? CALCIUM 8.0 (L) 04/24/2021 0413  ? GFRNONAA 14 (L) 04/24/2021 0413  ? GFRAA 52 (L) 07/31/2017 1630  ? ? ?INR ?   ?Component Value Date/Time  ? INR 1.1 04/18/2021 1820  ? ? ? ?Intake/Output Summary (Last 24 hours) at 04/24/2021 0744 ?Last data filed at 04/24/2021 0540 ?Gross per 24 hour  ?Intake 594 ml  ?Output 2125 ml  ?Net -1531 ml  ? ? ? ?Assessment/Plan:  87 y.o. male is s/p:  ?Right AVF now with dehiscence of wound ? ? ?-fibrinous exudate present in wound.  Superficial exudate trimmed.   ?-changed from iodoform to wet to dry saline dressing change to help clean up the wound.  Pt tolerated dressing change well. ?-most likely for discharge to SNF today.  He will follow up on 3/7 for wound check.   ?-would change to wet to dry dressing changes daily. ? ? ? ?Leontine Locket, PA-C ?[facility] ?[phone] ?04/24/2021 ?7:44 AM ? ? ? ?

## 2021-04-24 NOTE — Progress Notes (Signed)
DISCHARGE NOTE HOME ?Wayne Meadows to be discharged Skilled nursing facility per MD order. Discussed prescriptions and follow up appointments with the patient. Prescriptions given to patient; medication list explained in detail. Patient verbalized understanding. ? ?Skin clean, dry and intact without evidence of skin break down, no evidence of skin tears noted. IV catheter discontinued intact. Site without signs and symptoms of complications. Dressing and pressure applied. Pt denies pain at the site currently. No complaints noted. ? ?Patient free of lines, drains, and wounds.  ? ?An After Visit Summary (AVS) was printed and given to the patient/placed in discharge packet. ?Patient escorted via stretcher, and discharged home via [place]. ? ?Vira Agar, RN  ?

## 2021-04-25 DIAGNOSIS — J449 Chronic obstructive pulmonary disease, unspecified: Secondary | ICD-10-CM | POA: Diagnosis not present

## 2021-04-25 DIAGNOSIS — I2510 Atherosclerotic heart disease of native coronary artery without angina pectoris: Secondary | ICD-10-CM | POA: Diagnosis not present

## 2021-04-25 DIAGNOSIS — I10 Essential (primary) hypertension: Secondary | ICD-10-CM | POA: Diagnosis not present

## 2021-04-25 DIAGNOSIS — Z992 Dependence on renal dialysis: Secondary | ICD-10-CM | POA: Diagnosis not present

## 2021-04-25 DIAGNOSIS — D509 Iron deficiency anemia, unspecified: Secondary | ICD-10-CM | POA: Diagnosis not present

## 2021-04-25 DIAGNOSIS — N2581 Secondary hyperparathyroidism of renal origin: Secondary | ICD-10-CM | POA: Diagnosis not present

## 2021-04-25 DIAGNOSIS — N186 End stage renal disease: Secondary | ICD-10-CM | POA: Diagnosis not present

## 2021-04-25 DIAGNOSIS — K259 Gastric ulcer, unspecified as acute or chronic, without hemorrhage or perforation: Secondary | ICD-10-CM | POA: Diagnosis not present

## 2021-04-25 DIAGNOSIS — M6281 Muscle weakness (generalized): Secondary | ICD-10-CM | POA: Diagnosis not present

## 2021-04-25 DIAGNOSIS — I639 Cerebral infarction, unspecified: Secondary | ICD-10-CM | POA: Diagnosis not present

## 2021-04-25 DIAGNOSIS — I129 Hypertensive chronic kidney disease with stage 1 through stage 4 chronic kidney disease, or unspecified chronic kidney disease: Secondary | ICD-10-CM | POA: Diagnosis not present

## 2021-04-25 DIAGNOSIS — D631 Anemia in chronic kidney disease: Secondary | ICD-10-CM | POA: Diagnosis not present

## 2021-04-25 DIAGNOSIS — I120 Hypertensive chronic kidney disease with stage 5 chronic kidney disease or end stage renal disease: Secondary | ICD-10-CM | POA: Diagnosis not present

## 2021-04-25 DIAGNOSIS — E039 Hypothyroidism, unspecified: Secondary | ICD-10-CM | POA: Diagnosis not present

## 2021-04-25 DIAGNOSIS — E782 Mixed hyperlipidemia: Secondary | ICD-10-CM | POA: Diagnosis not present

## 2021-04-25 DIAGNOSIS — Z23 Encounter for immunization: Secondary | ICD-10-CM | POA: Diagnosis not present

## 2021-04-25 DIAGNOSIS — A499 Bacterial infection, unspecified: Secondary | ICD-10-CM | POA: Diagnosis not present

## 2021-04-25 DIAGNOSIS — D638 Anemia in other chronic diseases classified elsewhere: Secondary | ICD-10-CM | POA: Diagnosis not present

## 2021-04-25 DIAGNOSIS — L03119 Cellulitis of unspecified part of limb: Secondary | ICD-10-CM | POA: Diagnosis not present

## 2021-04-28 ENCOUNTER — Ambulatory Visit: Payer: Medicare Other | Admitting: Physician Assistant

## 2021-04-28 DIAGNOSIS — Z992 Dependence on renal dialysis: Secondary | ICD-10-CM | POA: Diagnosis not present

## 2021-04-28 DIAGNOSIS — I2510 Atherosclerotic heart disease of native coronary artery without angina pectoris: Secondary | ICD-10-CM

## 2021-04-28 DIAGNOSIS — A499 Bacterial infection, unspecified: Secondary | ICD-10-CM | POA: Diagnosis not present

## 2021-04-28 DIAGNOSIS — I639 Cerebral infarction, unspecified: Secondary | ICD-10-CM

## 2021-04-28 DIAGNOSIS — N186 End stage renal disease: Secondary | ICD-10-CM | POA: Diagnosis not present

## 2021-04-28 DIAGNOSIS — N2581 Secondary hyperparathyroidism of renal origin: Secondary | ICD-10-CM | POA: Diagnosis not present

## 2021-04-28 DIAGNOSIS — D509 Iron deficiency anemia, unspecified: Secondary | ICD-10-CM | POA: Diagnosis not present

## 2021-04-28 DIAGNOSIS — I10 Essential (primary) hypertension: Secondary | ICD-10-CM

## 2021-04-28 DIAGNOSIS — Z23 Encounter for immunization: Secondary | ICD-10-CM | POA: Diagnosis not present

## 2021-04-28 DIAGNOSIS — D631 Anemia in chronic kidney disease: Secondary | ICD-10-CM | POA: Diagnosis not present

## 2021-04-28 NOTE — Progress Notes (Deleted)
[facility] [place] ?PROGRESS NOTE ? ?ASSESSMENT / PLAN: ?Wayne Meadows is a 87 y.o. male ***  ? ?SUBJECTIVE: ?*** ? ?OBJECTIVE: ?There were no vitals taken for this visit. ?'@INTAKEOUTPUTBRIEF'$ @  ?Urine output over past 24 hours: *** ? ?Constitutional: *** appearing. *** acute distress. ?CNS: *** ?Cardiac: ***. ?Pulmonary: *** ?Abdomen: *** ?Vascular: *** ? ?CBC Latest Ref Rng & Units 04/24/2021 04/23/2021 04/22/2021  ?WBC 4.0 - 10.5 K/uL 7.6 7.6 8.8  ?Hemoglobin 13.0 - 17.0 g/dL 8.5(L) 7.5(L) 8.6(L)  ?Hematocrit 39.0 - 52.0 % 26.6(L) 23.7(L) 26.1(L)  ?Platelets 150 - 400 K/uL 245 245 287  ?  ? ?CMP Latest Ref Rng & Units 04/24/2021 04/23/2021 04/22/2021  ?Glucose 70 - 99 mg/dL 92 89 91  ?BUN 8 - 23 mg/dL 17 29(H) 18  ?Creatinine 0.61 - 1.24 mg/dL 3.98(H) 6.01(H) 4.64(H)  ?Sodium 135 - 145 mmol/L 138 135 135  ?Potassium 3.5 - 5.1 mmol/L 3.7 3.4(L) 4.2  ?Chloride 98 - 111 mmol/L 100 100 98  ?CO2 22 - 32 mmol/L '26 26 28  '$ ?Calcium 8.9 - 10.3 mg/dL 8.0(L) 8.1(L) 8.2(L)  ?Total Protein 6.5 - 8.1 g/dL - - -  ?Total Bilirubin 0.3 - 1.2 mg/dL - - -  ?Alkaline Phos 38 - 126 U/L - - -  ?AST 15 - 41 U/L - - -  ?ALT 0 - 44 U/L - - -  ? ? ?Estimated Creatinine Clearance: 15.2 mL/min (A) (by C-G formula based on SCr of 3.98 mg/dL (H)). ? ?*** ? ?Wayne Meadows. Stanford Breed, MD ?[facility] ?Office Phone Number: [phone] ?04/28/2021 8:03 PM ? ? ? ?

## 2021-04-29 ENCOUNTER — Ambulatory Visit: Payer: Medicare Other | Admitting: Vascular Surgery

## 2021-04-29 DIAGNOSIS — I120 Hypertensive chronic kidney disease with stage 5 chronic kidney disease or end stage renal disease: Secondary | ICD-10-CM | POA: Diagnosis not present

## 2021-04-29 DIAGNOSIS — I10 Essential (primary) hypertension: Secondary | ICD-10-CM | POA: Diagnosis not present

## 2021-04-29 DIAGNOSIS — J449 Chronic obstructive pulmonary disease, unspecified: Secondary | ICD-10-CM | POA: Diagnosis not present

## 2021-04-29 DIAGNOSIS — F419 Anxiety disorder, unspecified: Secondary | ICD-10-CM | POA: Diagnosis not present

## 2021-04-29 DIAGNOSIS — M6281 Muscle weakness (generalized): Secondary | ICD-10-CM | POA: Diagnosis not present

## 2021-04-29 DIAGNOSIS — N186 End stage renal disease: Secondary | ICD-10-CM | POA: Diagnosis not present

## 2021-04-30 DIAGNOSIS — D631 Anemia in chronic kidney disease: Secondary | ICD-10-CM | POA: Diagnosis not present

## 2021-04-30 DIAGNOSIS — A499 Bacterial infection, unspecified: Secondary | ICD-10-CM | POA: Diagnosis not present

## 2021-04-30 DIAGNOSIS — N186 End stage renal disease: Secondary | ICD-10-CM | POA: Diagnosis not present

## 2021-04-30 DIAGNOSIS — Z992 Dependence on renal dialysis: Secondary | ICD-10-CM | POA: Diagnosis not present

## 2021-04-30 DIAGNOSIS — N2581 Secondary hyperparathyroidism of renal origin: Secondary | ICD-10-CM | POA: Diagnosis not present

## 2021-04-30 DIAGNOSIS — Z23 Encounter for immunization: Secondary | ICD-10-CM | POA: Diagnosis not present

## 2021-04-30 DIAGNOSIS — D509 Iron deficiency anemia, unspecified: Secondary | ICD-10-CM | POA: Diagnosis not present

## 2021-05-01 ENCOUNTER — Emergency Department (HOSPITAL_COMMUNITY)
Admission: EM | Admit: 2021-05-01 | Discharge: 2021-05-01 | Disposition: A | Discharge status: Skilled Nursing Facility | Payer: No Typology Code available for payment source | Attending: Emergency Medicine | Admitting: Emergency Medicine

## 2021-05-01 ENCOUNTER — Other Ambulatory Visit: Payer: Self-pay

## 2021-05-01 ENCOUNTER — Emergency Department (HOSPITAL_COMMUNITY): Payer: No Typology Code available for payment source

## 2021-05-01 ENCOUNTER — Encounter (HOSPITAL_COMMUNITY): Payer: Self-pay | Admitting: *Deleted

## 2021-05-01 DIAGNOSIS — Z992 Dependence on renal dialysis: Secondary | ICD-10-CM | POA: Diagnosis not present

## 2021-05-01 DIAGNOSIS — Z7982 Long term (current) use of aspirin: Secondary | ICD-10-CM | POA: Insufficient documentation

## 2021-05-01 DIAGNOSIS — N390 Urinary tract infection, site not specified: Secondary | ICD-10-CM

## 2021-05-01 DIAGNOSIS — R109 Unspecified abdominal pain: Secondary | ICD-10-CM | POA: Insufficient documentation

## 2021-05-01 DIAGNOSIS — K5900 Constipation, unspecified: Secondary | ICD-10-CM

## 2021-05-01 LAB — URINALYSIS, ROUTINE W REFLEX MICROSCOPIC
Bilirubin Urine: NEGATIVE
Glucose, UA: NEGATIVE mg/dL
Ketones, ur: 5 mg/dL — AB
Nitrite: NEGATIVE
Protein, ur: >=300 mg/dL — AB
RBC / HPF: >50 RBC/hpf — ABNORMAL HIGH (ref 0–5)
Specific Gravity, Urine: 1.025 (ref 1.005–1.030)
WBC, UA: >50 WBC/hpf — ABNORMAL HIGH (ref 0–5)
pH: 5 (ref 5.0–8.0)

## 2021-05-01 LAB — CBC WITH DIFFERENTIAL/PLATELET
Abs Immature Granulocytes: 0.06 10*3/uL (ref 0.00–0.07)
Basophils Absolute: 0 10*3/uL (ref 0.0–0.1)
Basophils Relative: 0 %
Eosinophils Absolute: 0 10*3/uL (ref 0.0–0.5)
Eosinophils Relative: 0 %
HCT: 28.8 % — ABNORMAL LOW (ref 39.0–52.0)
Hemoglobin: 9.4 g/dL — ABNORMAL LOW (ref 13.0–17.0)
Immature Granulocytes: 1 %
Lymphocytes Relative: 7 %
Lymphs Abs: 0.8 10*3/uL (ref 0.7–4.0)
MCH: 29.7 pg (ref 26.0–34.0)
MCHC: 32.6 g/dL (ref 30.0–36.0)
MCV: 91.1 fL (ref 80.0–100.0)
Monocytes Absolute: 0.7 10*3/uL (ref 0.1–1.0)
Monocytes Relative: 6 %
Neutro Abs: 9.2 10*3/uL — ABNORMAL HIGH (ref 1.7–7.7)
Neutrophils Relative %: 86 %
Platelets: 337 10*3/uL (ref 150–400)
RBC: 3.16 MIL/uL — ABNORMAL LOW (ref 4.22–5.81)
RDW: 16.1 % — ABNORMAL HIGH (ref 11.5–15.5)
WBC: 10.8 10*3/uL — ABNORMAL HIGH (ref 4.0–10.5)
nRBC: 0 % (ref 0.0–0.2)

## 2021-05-01 LAB — COMPREHENSIVE METABOLIC PANEL
ALT: 7 U/L (ref 0–44)
AST: 16 U/L (ref 15–41)
Albumin: 1.9 g/dL — ABNORMAL LOW (ref 3.5–5.0)
Alkaline Phosphatase: 94 U/L (ref 38–126)
Anion gap: 13 (ref 5–15)
BUN: 27 mg/dL — ABNORMAL HIGH (ref 8–23)
CO2: 29 mmol/L (ref 22–32)
Calcium: 7.9 mg/dL — ABNORMAL LOW (ref 8.9–10.3)
Chloride: 97 mmol/L — ABNORMAL LOW (ref 98–111)
Creatinine, Ser: 4.87 mg/dL — ABNORMAL HIGH (ref 0.61–1.24)
GFR, Estimated: 11 mL/min — ABNORMAL LOW (ref >60)
Glucose, Bld: 112 mg/dL — ABNORMAL HIGH (ref 70–99)
Potassium: 3.4 mmol/L — ABNORMAL LOW (ref 3.5–5.1)
Sodium: 139 mmol/L (ref 135–145)
Total Bilirubin: 0.2 mg/dL — ABNORMAL LOW (ref 0.3–1.2)
Total Protein: 6.3 g/dL — ABNORMAL LOW (ref 6.5–8.1)

## 2021-05-01 LAB — TYPE AND SCREEN
ABO/RH(D): O POS
Antibody Screen: NEGATIVE

## 2021-05-01 LAB — LIPASE, BLOOD: Lipase: 44 U/L (ref 11–51)

## 2021-05-01 LAB — LACTIC ACID, PLASMA: Lactic Acid, Venous: 0.8 mmol/L (ref 0.5–1.9)

## 2021-05-01 LAB — POC OCCULT BLOOD, ED: Fecal Occult Bld: POSITIVE — AB

## 2021-05-01 MED ORDER — SORBITOL 70 % SOLN
960.0000 mL | TOPICAL_OIL | Freq: Once | ORAL | Status: AC
  Administered 2021-05-01: 960 mL via RECTAL
  Filled 2021-05-01: qty 473

## 2021-05-01 MED ORDER — FENTANYL CITRATE PF 50 MCG/ML IJ SOSY
50.0000 ug | PREFILLED_SYRINGE | Freq: Once | INTRAMUSCULAR | Status: AC
Start: 2021-05-01 — End: 2021-05-01
  Administered 2021-05-01: 07:00:00 50 ug via INTRAVENOUS
  Filled 2021-05-01: qty 1

## 2021-05-01 MED ORDER — POLYETHYLENE GLYCOL 3350 17 G PO PACK
17.0000 g | PACK | Freq: Every day | ORAL | 0 refills | Status: AC
Start: 2021-05-01 — End: ?

## 2021-05-01 MED ORDER — SORBITOL 70 % SOLN
960.0000 mL | TOPICAL_OIL | Freq: Once | ORAL | Status: DC
  Filled 2021-05-01: qty 473

## 2021-05-01 MED ORDER — DOCUSATE SODIUM 100 MG PO CAPS: 100.0000 mg | ORAL_CAPSULE | Freq: Two times a day (BID) | ORAL | 0 refills | Status: DC

## 2021-05-01 MED ORDER — ONDANSETRON HCL 4 MG/2ML IJ SOLN
4.0000 mg | Freq: Once | INTRAMUSCULAR | Status: AC
  Administered 2021-05-01: 07:00:00 4 mg via INTRAVENOUS
  Filled 2021-05-01: qty 2

## 2021-05-01 MED ORDER — ACETAMINOPHEN 500 MG PO TABS
1000.0000 mg | ORAL_TABLET | Freq: Four times a day (QID) | ORAL | Status: DC | PRN
  Administered 2021-05-01: 20:00:00 1000 mg via ORAL
  Filled 2021-05-01: qty 2

## 2021-05-01 MED ORDER — CEPHALEXIN 250 MG PO CAPS
250.0000 mg | ORAL_CAPSULE | Freq: Once | ORAL | Status: AC
  Administered 2021-05-01: 17:00:00 250 mg via ORAL
  Filled 2021-05-01: qty 1

## 2021-05-01 MED ORDER — CEPHALEXIN 250 MG PO CAPS: 250.0000 mg | ORAL_CAPSULE | Freq: Every day | ORAL | 0 refills | Status: AC

## 2021-05-01 MED ORDER — MINERAL OIL RE ENEM
1.0000 | ENEMA | Freq: Once | RECTAL | Status: DC
  Filled 2021-05-01: qty 1

## 2021-05-01 NOTE — ED Notes (Signed)
This paramedic administered 3/4ths of the enema. Pt stated "I can't take anymore". ?

## 2021-05-01 NOTE — ED Triage Notes (Addendum)
Patient presents to ED via [facility] from [facility]. C/o abd. Pain onset last pm at 7pm. Vomited x 1 earlier. States he is a M-W-F dialysis patient and had a full dialysis treatment Monday. Bowel sounds present.  ?

## 2021-05-01 NOTE — Discharge Instructions (Signed)
If you develop worsening, continued, or recurrent abdominal pain, uncontrolled vomiting, fever, chest or back pain, or any other new/concerning symptoms then return to the ER for evaluation.  

## 2021-05-01 NOTE — ED Provider Notes (Signed)
7:49 AM  Care transferred to me.  Patient is complaining of a lot of abdominal pain on my assessment.  CT images reviewed and there is a significant amount of stool.  No obstruction. Dr. Dayna Barker reports there was no impaction.  Thus we will give enema and reassess. I discussed with pharmacy, no apparent contraindications to smog enema, will try this. ? ?Patient did have multiple stools throughout the morning and afternoon.  He is feeling a lot better and states his abdominal discomfort is gone.  However later he states he needed to urinate but could not and was found to have 400 cc or so in his bladder on the bladder scan.  Thus an in and out was performed as he does not urinate often.  This was appearing abnormal visually so urinalysis was obtained which shows likely urinary tract infection.  We will treat with Keflex dosed with his dialysis.  Otherwise, from a constipation perspective he feels a lot better and feels well enough for discharge.  Will adjust meds for bowel regimen ?  ?Sherwood Gambler, MD ?05/01/21 1535 ? ?

## 2021-05-01 NOTE — ED Provider Notes (Signed)
?[facility] ?Provider Note ? ? ?CSN: [id number] ?Arrival date & time: 05/01/21  0547 ? ?  ? ?History ? ?Chief Complaint  ?Patient presents with  ? Abdominal Pain  ? ? ?Wayne Meadows is a 87 y.o. male. ? ?87 year old male who presents to the emergency department today secondary to abdominal pain.  Patient states a also has abdominal cramping after dialysis.  He received dialysis yesterday.  When he got home he had the abdominal cramping that he often gets but states that he got worse throughout the night instead of better like he usually does.  He had 1 episode nonbloody nonbilious emesis.  States he feels like he needs to have a bowel movement he cannot.  He tried suppository and MiraLAX without help.  Patient states afebrile.  On review the records appears the patient's had some GI bleeding in the past but he does not recall this.  Patient states he passes gas still.  Only makes urine about once a week. ? ? ?Abdominal Pain ? ?  ? ?Home Medications ?Prior to Admission medications   ?Medication Sig Start Date End Date Taking? Authorizing Provider  ?acetaminophen (TYLENOL) 325 MG tablet Take 2 tablets (650 mg total) by mouth every 6 (six) hours as needed for mild pain (or Fever >/= 101). 04/10/21   Bonnielee Haff, MD  ?albuterol (VENTOLIN HFA) 108 (90 Base) MCG/ACT inhaler Inhale 1 puff into the lungs every 6 (six) hours as needed for shortness of breath.    [provider]  ?aspirin EC 81 MG tablet Take 81 mg by mouth daily.    [provider]  ?camphor-menthol Timoteo Ace) lotion Apply topically as needed for itching. 04/23/21   Alma Friendly, MD  ?cloNIDine (CATAPRES) 0.1 MG tablet Take 1 tablet (0.1 mg total) by mouth daily. 06/24/20   Harvie Heck, MD  ?diltiazem (CARDIZEM CD) 180 MG 24 hr capsule Take 1 capsule (180 mg total) by mouth daily. 04/11/21   Bonnielee Haff, MD  ?ergocalciferol (VITAMIN D2) 1.25 MG (50000 UT) capsule Take 50,000 Units by mouth  once a week.    [provider]  ?ferrous sulfate 325 (65 FE) MG tablet Take 1 tablet by mouth See admin instructions. Take one tablet by mouth Every Monday, Wednesday, and Friday 07/30/20   [provider]  ?hydrALAZINE (APRESOLINE) 10 MG tablet Take 1 tablet (10 mg total) by mouth every 8 (eight) hours as needed (for SBP greater than 180). 04/04/21   Little Ishikawa, MD  ?HYDROcodone-acetaminophen (NORCO/VICODIN) 5-325 MG tablet Take 1 tablet by mouth every 6 (six) hours as needed for moderate pain or severe pain. 04/23/21   Alma Friendly, MD  ?Ipratropium-Albuterol (COMBIVENT) 20-100 MCG/ACT AERS respimat Inhale 1 puff into the lungs 4 (four) times daily.    [provider]  ?lanthanum (FOSRENOL) 1000 MG chewable tablet Chew 1 tablet (1,000 mg total) by mouth 3 (three) times daily with meals. 04/04/21   Little Ishikawa, MD  ?levothyroxine (SYNTHROID) 50 MCG tablet Take 50 mcg by mouth daily before breakfast.    [provider]  ?Multiple Vitamin (MULTIVITAMIN) tablet Take 1 tablet by mouth daily.    [provider]  ?naphazoline-glycerin (CLEAR EYES REDNESS) 0.012-0.25 % SOLN Place 1-2 drops into the right eye 3 (three) times daily. 04/23/21   Alma Friendly, MD  ?neomycin-bacitracin-polymyxin (NEOSPORIN) OINT Apply 1 application topically 3 (three) times daily. To right antecubital area 04/10/21   Bonnielee Haff, MD  ?Nutritional  Supplements (,FEEDING SUPPLEMENT, PROSOURCE PLUS) liquid Take 30 mLs by mouth 2 (two) times daily between meals. 04/23/21   Alma Friendly, MD  ?Nutritional Supplements (FEEDING SUPPLEMENT, NEPRO CARB STEADY,) LIQD Take 237 mLs by mouth 3 (three) times daily before meals. 04/23/21   Alma Friendly, MD  ?ondansetron (ZOFRAN) 4 MG tablet Take 1 tablet (4 mg total) by mouth daily as needed for nausea or vomiting. 04/11/21 04/11/22  Bonnielee Haff, MD  ?pantoprazole (PROTONIX) 40 MG tablet Take 1 tablet (40 mg total) by  mouth 2 (two) times daily. 04/10/21   Bonnielee Haff, MD  ?polyethylene glycol (MIRALAX / GLYCOLAX) 17 g packet Take 17 g by mouth daily as needed for mild constipation or moderate constipation. 08/15/20   [name], MD  ?pravastatin (PRAVACHOL) 40 MG tablet Take 40 mg by mouth at bedtime.    [provider]  ?tamsulosin (FLOMAX) 0.4 MG CAPS capsule Take 1 capsule (0.4 mg total) by mouth daily. 04/11/21   Bonnielee Haff, MD  ?   ? ?Allergies    ?Bee venom and Influenza vaccines   ? ?Review of Systems   ?Review of Systems  ?Gastrointestinal:  Positive for abdominal pain.  ? ?Physical Exam ?Updated Vital Signs ?BP (!) 182/75   Pulse 99   Temp 98.7 ?F (37.1 ?C) (Oral)   Resp 17   Ht '6\' 2"'$  (1.88 m)   Wt 77.6 kg   SpO2 98%   BMI 21.96 kg/m?  ?Physical Exam ?Vitals and nursing note reviewed.  ?Constitutional:   ?   Appearance: He is well-developed.  ?HENT:  ?   Head: Normocephalic and atraumatic.  ?Cardiovascular:  ?   Rate and Rhythm: Normal rate.  ?   Heart sounds: Normal heart sounds.  ?Pulmonary:  ?   Effort: Pulmonary effort is normal. No respiratory distress.  ?Abdominal:  ?   General: Bowel sounds are normal. There is no distension.  ?   Tenderness: There is abdominal tenderness (Diffuse without guarding or other evidence of peritonitis.).  ?Genitourinary: ?   Comments: Dark stool in his depends and around his rectum.  No obvious rectal impaction.  Stool sent for Hemoccult testing. ?Musculoskeletal:     ?   General: Normal range of motion.  ?   Cervical back: Normal range of motion.  ?Skin: ?   Coloration: Skin is pale.  ?Neurological:  ?   Mental Status: He is alert.  ? ? ?ED Results / Procedures / Treatments   ?Labs ?(all labs ordered are listed, but only abnormal results are displayed) ?Labs Reviewed  ?POC OCCULT BLOOD, ED - Abnormal; Notable for the following components:  ?    Result Value  ? Fecal Occult Bld POSITIVE (*)   ? All other components within normal limits  ?CBC WITH  DIFFERENTIAL/PLATELET  ?COMPREHENSIVE METABOLIC PANEL  ?LIPASE, BLOOD  ?TYPE AND SCREEN  ? ? ?EKG ?None ? ?Radiology ?No results found. ? ?Procedures ?Procedures  ? ? ?Medications Ordered in ED ?Medications  ?fentaNYL (SUBLIMAZE) injection 50 mcg (has no administration in time range)  ?ondansetron (ZOFRAN) injection 4 mg (has no administration in time range)  ? ? ?ED Course/ Medical Decision Making/ A&P ?  ?                        ?Medical Decision Making ?Amount and/or Complexity of Data Reviewed ?Labs: ordered. ?Radiology: ordered. ?ECG/medicine tests: ordered. ? ?Risk ?OTC drugs. ?Prescription drug management. ? ? ?Patient with melanotic  stool concern for possible GI bleed.  Type and screen, CBC, CMP, Hemoccult, lactic acid, lipase and CT scan ordered.  Will give pain medicine and nausea medicine for now.  He is a dialysis patient so we will be judicious on use of fluids. ? ?Care transferred pending ct scan and reevaluation.  ? ?Final Clinical Impression(s) / ED Diagnoses ?Final diagnoses:  ?None  ? ? ?Rx / DC Orders ?ED Discharge Orders   ? ? None  ? ?  ? ? ?  ?Labrina Lines, Corene Cornea, MD ?05/02/21 0122 ? ?

## 2021-05-02 DIAGNOSIS — K5900 Constipation, unspecified: Secondary | ICD-10-CM | POA: Diagnosis not present

## 2021-05-02 DIAGNOSIS — J449 Chronic obstructive pulmonary disease, unspecified: Secondary | ICD-10-CM | POA: Diagnosis not present

## 2021-05-02 DIAGNOSIS — D631 Anemia in chronic kidney disease: Secondary | ICD-10-CM | POA: Diagnosis not present

## 2021-05-02 DIAGNOSIS — Z992 Dependence on renal dialysis: Secondary | ICD-10-CM | POA: Diagnosis not present

## 2021-05-02 DIAGNOSIS — D509 Iron deficiency anemia, unspecified: Secondary | ICD-10-CM | POA: Diagnosis not present

## 2021-05-02 DIAGNOSIS — N186 End stage renal disease: Secondary | ICD-10-CM | POA: Diagnosis not present

## 2021-05-02 DIAGNOSIS — I10 Essential (primary) hypertension: Secondary | ICD-10-CM | POA: Diagnosis not present

## 2021-05-02 DIAGNOSIS — A499 Bacterial infection, unspecified: Secondary | ICD-10-CM | POA: Diagnosis not present

## 2021-05-02 DIAGNOSIS — Z23 Encounter for immunization: Secondary | ICD-10-CM | POA: Diagnosis not present

## 2021-05-02 DIAGNOSIS — N2581 Secondary hyperparathyroidism of renal origin: Secondary | ICD-10-CM | POA: Diagnosis not present

## 2021-05-02 LAB — URINE CULTURE: Culture: >=100000 — AB

## 2021-05-03 ENCOUNTER — Telehealth (HOSPITAL_BASED_OUTPATIENT_CLINIC_OR_DEPARTMENT_OTHER): Payer: Self-pay | Admitting: *Deleted

## 2021-05-03 NOTE — Progress Notes (Signed)
ED Antimicrobial Stewardship Positive Culture Follow Up   Wayne Meadows is an 87 y.o. male who presented to [facility] on 05/01/2021 with a chief complaint of  Chief Complaint  Patient presents with   Abdominal Pain    Recent Results (from the past 720 hour(s))  Resp Panel by RT-PCR (Flu A&B, Covid) Nasopharyngeal Swab     Status: None   Collection Time: 04/06/21  2:04 AM   Specimen: Nasopharyngeal Swab; Nasopharyngeal(NP) swabs in vial transport medium  Result Value Ref Range Status   SARS Coronavirus 2 by RT PCR NEGATIVE NEGATIVE Final    Comment: (NOTE) SARS-CoV-2 target nucleic acids are NOT DETECTED.  The SARS-CoV-2 RNA is generally detectable in upper respiratory specimens during the acute phase of infection. The lowest concentration of SARS-CoV-2 viral copies this assay can detect is 138 copies/mL. A negative result does not preclude SARS-Cov-2 infection and should not be used as the sole basis for treatment or other patient management decisions. A negative result may occur with  improper specimen collection/handling, submission of specimen other than nasopharyngeal swab, presence of viral mutation(s) within the areas targeted by this assay, and inadequate number of viral copies(<138 copies/mL). A negative result must be combined with clinical observations, patient history, and epidemiological information. The expected result is Negative.  Fact Sheet for Patients:  EntrepreneurPulse.com.au  Fact Sheet for Healthcare Providers:  IncredibleEmployment.be  This test is no t yet approved or cleared by the [place] FDA and  has been authorized for detection and/or diagnosis of SARS-CoV-2 by FDA under an Emergency Use Authorization (EUA). This EUA will remain  in effect (meaning this test can be used) for the duration of the COVID-19 declaration under Section 564(b)(1) of the Act, 21 U.S.C.section 360bbb-3(b)(1), unless the  authorization is terminated  or revoked sooner.       Influenza A by PCR NEGATIVE NEGATIVE Final   Influenza B by PCR NEGATIVE NEGATIVE Final    Comment: (NOTE) The Xpert Xpress SARS-CoV-2/FLU/RSV plus assay is intended as an aid in the diagnosis of influenza from Nasopharyngeal swab specimens and should not be used as a sole basis for treatment. Nasal washings and aspirates are unacceptable for Xpert Xpress SARS-CoV-2/FLU/RSV testing.  Fact Sheet for Patients: EntrepreneurPulse.com.au  Fact Sheet for Healthcare Providers: IncredibleEmployment.be  This test is not yet approved or cleared by the [place] FDA and has been authorized for detection and/or diagnosis of SARS-CoV-2 by FDA under an Emergency Use Authorization (EUA). This EUA will remain in effect (meaning this test can be used) for the duration of the COVID-19 declaration under Section 564(b)(1) of the Act, 21 U.S.C. section 360bbb-3(b)(1), unless the authorization is terminated or revoked.  Performed at [facility], [place] [address], [place],  [address]   Resp Panel by RT-PCR (Flu A&B, Covid) Nasopharyngeal Swab     Status: None   Collection Time: 04/18/21  6:08 PM   Specimen: Nasopharyngeal Swab; Nasopharyngeal(NP) swabs in vial transport medium  Result Value Ref Range Status   SARS Coronavirus 2 by RT PCR NEGATIVE NEGATIVE Final    Comment: (NOTE) SARS-CoV-2 target nucleic acids are NOT DETECTED.  The SARS-CoV-2 RNA is generally detectable in upper respiratory specimens during the acute phase of infection. The lowest concentration of SARS-CoV-2 viral copies this assay can detect is 138 copies/mL. A negative result does not preclude SARS-Cov-2 infection and should not be used as the sole basis for treatment or other patient management decisions. A negative result may occur  with  improper specimen collection/handling, submission of specimen other than  nasopharyngeal swab, presence of viral mutation(s) within the areas targeted by this assay, and inadequate number of viral copies(<138 copies/mL). A negative result must be combined with clinical observations, patient history, and epidemiological information. The expected result is Negative.  Fact Sheet for Patients:  EntrepreneurPulse.com.au  Fact Sheet for Healthcare Providers:  IncredibleEmployment.be  This test is no t yet approved or cleared by the [place] FDA and  has been authorized for detection and/or diagnosis of SARS-CoV-2 by FDA under an Emergency Use Authorization (EUA). This EUA will remain  in effect (meaning this test can be used) for the duration of the COVID-19 declaration under Section 564(b)(1) of the Act, 21 U.S.C.section 360bbb-3(b)(1), unless the authorization is terminated  or revoked sooner.       Influenza A by PCR NEGATIVE NEGATIVE Final   Influenza B by PCR NEGATIVE NEGATIVE Final    Comment: (NOTE) The Xpert Xpress SARS-CoV-2/FLU/RSV plus assay is intended as an aid in the diagnosis of influenza from Nasopharyngeal swab specimens and should not be used as a sole basis for treatment. Nasal washings and aspirates are unacceptable for Xpert Xpress SARS-CoV-2/FLU/RSV testing.  Fact Sheet for Patients: EntrepreneurPulse.com.au  Fact Sheet for Healthcare Providers: IncredibleEmployment.be  This test is not yet approved or cleared by the [place] FDA and has been authorized for detection and/or diagnosis of SARS-CoV-2 by FDA under an Emergency Use Authorization (EUA). This EUA will remain in effect (meaning this test can be used) for the duration of the COVID-19 declaration under Section 564(b)(1) of the Act, 21 U.S.C. section 360bbb-3(b)(1), unless the authorization is terminated or revoked.  Performed at [facility], [place] [address] [place], [place] [address]    Urine Culture     Status: Abnormal   Collection Time: 04/18/21  6:08 PM   Specimen: In/Out Cath Urine  Result Value Ref Range Status   Specimen Description IN/OUT CATH URINE  Final   Special Requests   Final    NONE Performed at [facility], [place] [address], [place], [place] [address]    Culture >=100,000 COLONIES/mL PSEUDOMONAS AERUGINOSA (A)  Final   Report Status 04/21/2021 FINAL  Final   Organism ID, Bacteria PSEUDOMONAS AERUGINOSA (A)  Final      Susceptibility   Pseudomonas aeruginosa - MIC*    CEFTAZIDIME 2 SENSITIVE Sensitive     CIPROFLOXACIN <=0.25 SENSITIVE Sensitive     GENTAMICIN <=1 SENSITIVE Sensitive     IMIPENEM 1 SENSITIVE Sensitive     PIP/TAZO <=4 SENSITIVE Sensitive     CEFEPIME 2 SENSITIVE Sensitive     * >=100,000 COLONIES/mL PSEUDOMONAS AERUGINOSA  Blood Culture (routine x 2)     Status: None   Collection Time: 04/18/21  6:20 PM   Specimen: BLOOD  Result Value Ref Range Status   Specimen Description BLOOD SITE NOT SPECIFIED  Final   Special Requests   Final    BOTTLES DRAWN AEROBIC AND ANAEROBIC Blood Culture results may not be optimal due to an excessive volume of blood received in culture bottles   Culture   Final    NO GROWTH 5 DAYS Performed at [facility], [place] [address], [place], [place] [address]    Report Status 04/23/2021 FINAL  Final  Blood Culture (routine x 2)     Status: None   Collection Time: 04/18/21  9:20 PM   Specimen: BLOOD  Result Value Ref Range Status  Specimen Description BLOOD BLOOD LEFT ARM  Final   Special Requests   Final    BOTTLES DRAWN AEROBIC AND ANAEROBIC Blood Culture adequate volume   Culture   Final    NO GROWTH 5 DAYS Performed at [facility], [address], [place], [place] [address]    Report Status 04/23/2021 FINAL  Final  Urine Culture     Status: Abnormal   Collection Time: 05/01/21  2:46 PM   Specimen: Urine, Catheterized  Result Value Ref Range Status   Specimen Description  URINE, CATHETERIZED  Final   Special Requests   Final    NONE Performed at [facility], [place] [address] [place], [place] [address]    Culture >=100,000 COLONIES/mL YEAST (A)  Final   Report Status 05/02/2021 FINAL  Final    '[x]'$  Patient may benefit from treatment for potential yeast infection.  New antibiotic prescription: Fluconazole '100mg'$  daily (take after dialysis on dialysis days) for 2 weeks (Qty 14; Refills 0)  ED Provider: Dorise Bullion PA   Lorelei Pont, PharmD, BCPS 05/03/2021 11:18 AM ED Clinical Pharmacist -  [phone]

## 2021-05-03 NOTE — Telephone Encounter (Signed)
Post ED Visit - Positive Culture Follow-up: Unsuccessful Patient Follow-up ? ?Culture assessed and recommendations reviewed by: ? ?'[x]'$ Lorelei Pont, Pharm.D. ?'[]'$  Heide Guile, Pharm.D., BCPS AQ-ID ?'[]'$  Parks Neptune, Pharm.D., BCPS ?'[]'$  Alycia Rossetti, Pharm.D., BCPS ?'[]'$  Minh Pham, Pharm.D., BCPS, AAHIVP ?'[]'$  Legrand Como, Pharm.D., BCPS, AAHIVP ?'[]'$  Wynell Balloon, PharmD ?'[]'$  Vincenza Hews, PharmD, BCPS ? ?Positive urine culture ? ?'[]'$  Patient discharged without antimicrobial prescription and treatment is now indicated ?'[]'$  Organism is resistant to prescribed ED discharge antimicrobial ?'[x]'$  Fluconazole '100mg'$  daily ( take in evenings on dialysis days for 2 weeks.  ? ? ?Unable to contact patient  letter will be sent to address on file ? ?Wayne Meadows ?05/03/2021, 3:36 PM  ?

## 2021-05-05 ENCOUNTER — Other Ambulatory Visit: Payer: Self-pay | Admitting: *Deleted

## 2021-05-05 DIAGNOSIS — N390 Urinary tract infection, site not specified: Secondary | ICD-10-CM | POA: Diagnosis not present

## 2021-05-05 DIAGNOSIS — J449 Chronic obstructive pulmonary disease, unspecified: Secondary | ICD-10-CM | POA: Diagnosis not present

## 2021-05-05 DIAGNOSIS — D509 Iron deficiency anemia, unspecified: Secondary | ICD-10-CM | POA: Diagnosis not present

## 2021-05-05 DIAGNOSIS — N186 End stage renal disease: Secondary | ICD-10-CM

## 2021-05-05 DIAGNOSIS — N2581 Secondary hyperparathyroidism of renal origin: Secondary | ICD-10-CM | POA: Diagnosis not present

## 2021-05-05 DIAGNOSIS — D631 Anemia in chronic kidney disease: Secondary | ICD-10-CM | POA: Diagnosis not present

## 2021-05-05 DIAGNOSIS — A499 Bacterial infection, unspecified: Secondary | ICD-10-CM | POA: Diagnosis not present

## 2021-05-05 DIAGNOSIS — Z23 Encounter for immunization: Secondary | ICD-10-CM | POA: Diagnosis not present

## 2021-05-05 DIAGNOSIS — Z992 Dependence on renal dialysis: Secondary | ICD-10-CM | POA: Diagnosis not present

## 2021-05-05 DIAGNOSIS — K259 Gastric ulcer, unspecified as acute or chronic, without hemorrhage or perforation: Secondary | ICD-10-CM | POA: Diagnosis not present

## 2021-05-05 DIAGNOSIS — I120 Hypertensive chronic kidney disease with stage 5 chronic kidney disease or end stage renal disease: Secondary | ICD-10-CM | POA: Diagnosis not present

## 2021-05-05 DIAGNOSIS — F419 Anxiety disorder, unspecified: Secondary | ICD-10-CM | POA: Diagnosis not present

## 2021-05-07 ENCOUNTER — Other Ambulatory Visit: Payer: Self-pay | Admitting: *Deleted

## 2021-05-07 DIAGNOSIS — D509 Iron deficiency anemia, unspecified: Secondary | ICD-10-CM | POA: Diagnosis not present

## 2021-05-07 DIAGNOSIS — A419 Sepsis, unspecified organism: Secondary | ICD-10-CM | POA: Diagnosis not present

## 2021-05-07 DIAGNOSIS — Z992 Dependence on renal dialysis: Secondary | ICD-10-CM | POA: Diagnosis not present

## 2021-05-07 DIAGNOSIS — N186 End stage renal disease: Secondary | ICD-10-CM | POA: Diagnosis not present

## 2021-05-07 DIAGNOSIS — D638 Anemia in other chronic diseases classified elsewhere: Secondary | ICD-10-CM | POA: Diagnosis not present

## 2021-05-07 DIAGNOSIS — N2581 Secondary hyperparathyroidism of renal origin: Secondary | ICD-10-CM | POA: Diagnosis not present

## 2021-05-07 DIAGNOSIS — A499 Bacterial infection, unspecified: Secondary | ICD-10-CM | POA: Diagnosis not present

## 2021-05-07 DIAGNOSIS — N390 Urinary tract infection, site not specified: Secondary | ICD-10-CM | POA: Diagnosis not present

## 2021-05-07 DIAGNOSIS — Z23 Encounter for immunization: Secondary | ICD-10-CM | POA: Diagnosis not present

## 2021-05-07 DIAGNOSIS — D631 Anemia in chronic kidney disease: Secondary | ICD-10-CM | POA: Diagnosis not present

## 2021-05-07 NOTE — Patient Outreach (Signed)
Per [facility] eligible member resides in [facility].  Screened for potential [facility] Care Management services as a benefit of member's insurance plan. ? ?[name] admitted to SNF on 04/24/21 after hospitalization. ? ?Facility site visit to [facility] skilled nursing facility. Met with Wayne Meadows, SNF SW concerning  potential [facility] needs. Anticipated transition plan is home.  ? ?[name]'s PCP is with the [place]. Will follow up to see if member is followed by [facility] provider specialists. ? ?Will continue to follow for potential [facility] Care Management needs.  ? ? ?Wayne Rolling, MSN, RN,BSN ?[place] Coordinator ?[phone] [facility]) ?[phone]  (Toll free office)  ? ? ? ? ? ? ? ?  ?

## 2021-05-08 DIAGNOSIS — L97819 Non-pressure chronic ulcer of other part of right lower leg with unspecified severity: Secondary | ICD-10-CM | POA: Diagnosis not present

## 2021-05-09 DIAGNOSIS — D509 Iron deficiency anemia, unspecified: Secondary | ICD-10-CM | POA: Diagnosis not present

## 2021-05-09 DIAGNOSIS — Z992 Dependence on renal dialysis: Secondary | ICD-10-CM | POA: Diagnosis not present

## 2021-05-09 DIAGNOSIS — Z23 Encounter for immunization: Secondary | ICD-10-CM | POA: Diagnosis not present

## 2021-05-09 DIAGNOSIS — N2581 Secondary hyperparathyroidism of renal origin: Secondary | ICD-10-CM | POA: Diagnosis not present

## 2021-05-09 DIAGNOSIS — N186 End stage renal disease: Secondary | ICD-10-CM | POA: Diagnosis not present

## 2021-05-09 DIAGNOSIS — D631 Anemia in chronic kidney disease: Secondary | ICD-10-CM | POA: Diagnosis not present

## 2021-05-09 DIAGNOSIS — A499 Bacterial infection, unspecified: Secondary | ICD-10-CM | POA: Diagnosis not present

## 2021-05-12 DIAGNOSIS — D631 Anemia in chronic kidney disease: Secondary | ICD-10-CM | POA: Diagnosis not present

## 2021-05-12 DIAGNOSIS — A499 Bacterial infection, unspecified: Secondary | ICD-10-CM | POA: Diagnosis not present

## 2021-05-12 DIAGNOSIS — N2581 Secondary hyperparathyroidism of renal origin: Secondary | ICD-10-CM | POA: Diagnosis not present

## 2021-05-12 DIAGNOSIS — F419 Anxiety disorder, unspecified: Secondary | ICD-10-CM | POA: Diagnosis not present

## 2021-05-12 DIAGNOSIS — J449 Chronic obstructive pulmonary disease, unspecified: Secondary | ICD-10-CM | POA: Diagnosis not present

## 2021-05-12 DIAGNOSIS — Z992 Dependence on renal dialysis: Secondary | ICD-10-CM | POA: Diagnosis not present

## 2021-05-12 DIAGNOSIS — Z23 Encounter for immunization: Secondary | ICD-10-CM | POA: Diagnosis not present

## 2021-05-12 DIAGNOSIS — K5900 Constipation, unspecified: Secondary | ICD-10-CM | POA: Diagnosis not present

## 2021-05-12 DIAGNOSIS — D509 Iron deficiency anemia, unspecified: Secondary | ICD-10-CM | POA: Diagnosis not present

## 2021-05-12 DIAGNOSIS — I120 Hypertensive chronic kidney disease with stage 5 chronic kidney disease or end stage renal disease: Secondary | ICD-10-CM | POA: Diagnosis not present

## 2021-05-12 DIAGNOSIS — N186 End stage renal disease: Secondary | ICD-10-CM | POA: Diagnosis not present

## 2021-05-14 DIAGNOSIS — N186 End stage renal disease: Secondary | ICD-10-CM | POA: Diagnosis not present

## 2021-05-14 DIAGNOSIS — D509 Iron deficiency anemia, unspecified: Secondary | ICD-10-CM | POA: Diagnosis not present

## 2021-05-14 DIAGNOSIS — N2581 Secondary hyperparathyroidism of renal origin: Secondary | ICD-10-CM | POA: Diagnosis not present

## 2021-05-14 DIAGNOSIS — D631 Anemia in chronic kidney disease: Secondary | ICD-10-CM | POA: Diagnosis not present

## 2021-05-14 DIAGNOSIS — A499 Bacterial infection, unspecified: Secondary | ICD-10-CM | POA: Diagnosis not present

## 2021-05-14 DIAGNOSIS — Z992 Dependence on renal dialysis: Secondary | ICD-10-CM | POA: Diagnosis not present

## 2021-05-16 DIAGNOSIS — D509 Iron deficiency anemia, unspecified: Secondary | ICD-10-CM | POA: Diagnosis not present

## 2021-05-16 DIAGNOSIS — Z992 Dependence on renal dialysis: Secondary | ICD-10-CM | POA: Diagnosis not present

## 2021-05-16 DIAGNOSIS — N2581 Secondary hyperparathyroidism of renal origin: Secondary | ICD-10-CM | POA: Diagnosis not present

## 2021-05-16 DIAGNOSIS — D631 Anemia in chronic kidney disease: Secondary | ICD-10-CM | POA: Diagnosis not present

## 2021-05-16 DIAGNOSIS — A499 Bacterial infection, unspecified: Secondary | ICD-10-CM | POA: Diagnosis not present

## 2021-05-16 DIAGNOSIS — N186 End stage renal disease: Secondary | ICD-10-CM | POA: Diagnosis not present

## 2021-05-19 DIAGNOSIS — D631 Anemia in chronic kidney disease: Secondary | ICD-10-CM | POA: Diagnosis not present

## 2021-05-19 DIAGNOSIS — Z992 Dependence on renal dialysis: Secondary | ICD-10-CM | POA: Diagnosis not present

## 2021-05-19 DIAGNOSIS — N2581 Secondary hyperparathyroidism of renal origin: Secondary | ICD-10-CM | POA: Diagnosis not present

## 2021-05-19 DIAGNOSIS — N186 End stage renal disease: Secondary | ICD-10-CM | POA: Diagnosis not present

## 2021-05-19 DIAGNOSIS — D509 Iron deficiency anemia, unspecified: Secondary | ICD-10-CM | POA: Diagnosis not present

## 2021-05-19 DIAGNOSIS — A499 Bacterial infection, unspecified: Secondary | ICD-10-CM | POA: Diagnosis not present

## 2021-05-20 ENCOUNTER — Encounter (HOSPITAL_COMMUNITY): Payer: Medicare Other

## 2021-05-21 DIAGNOSIS — Z992 Dependence on renal dialysis: Secondary | ICD-10-CM | POA: Diagnosis not present

## 2021-05-21 DIAGNOSIS — N186 End stage renal disease: Secondary | ICD-10-CM | POA: Diagnosis not present

## 2021-05-21 DIAGNOSIS — A499 Bacterial infection, unspecified: Secondary | ICD-10-CM | POA: Diagnosis not present

## 2021-05-21 DIAGNOSIS — N2581 Secondary hyperparathyroidism of renal origin: Secondary | ICD-10-CM | POA: Diagnosis not present

## 2021-05-21 DIAGNOSIS — D631 Anemia in chronic kidney disease: Secondary | ICD-10-CM | POA: Diagnosis not present

## 2021-05-21 DIAGNOSIS — D509 Iron deficiency anemia, unspecified: Secondary | ICD-10-CM | POA: Diagnosis not present

## 2021-05-23 DIAGNOSIS — D509 Iron deficiency anemia, unspecified: Secondary | ICD-10-CM | POA: Diagnosis not present

## 2021-05-23 DIAGNOSIS — Z992 Dependence on renal dialysis: Secondary | ICD-10-CM | POA: Diagnosis not present

## 2021-05-23 DIAGNOSIS — D631 Anemia in chronic kidney disease: Secondary | ICD-10-CM | POA: Diagnosis not present

## 2021-05-23 DIAGNOSIS — N2581 Secondary hyperparathyroidism of renal origin: Secondary | ICD-10-CM | POA: Diagnosis not present

## 2021-05-23 DIAGNOSIS — A499 Bacterial infection, unspecified: Secondary | ICD-10-CM | POA: Diagnosis not present

## 2021-05-23 DIAGNOSIS — N186 End stage renal disease: Secondary | ICD-10-CM | POA: Diagnosis not present

## 2021-05-28 ENCOUNTER — Other Ambulatory Visit: Payer: Self-pay | Admitting: *Deleted

## 2021-05-28 DIAGNOSIS — J449 Chronic obstructive pulmonary disease, unspecified: Secondary | ICD-10-CM | POA: Diagnosis not present

## 2021-05-28 DIAGNOSIS — M6281 Muscle weakness (generalized): Secondary | ICD-10-CM | POA: Diagnosis not present

## 2021-05-28 DIAGNOSIS — N186 End stage renal disease: Secondary | ICD-10-CM | POA: Diagnosis not present

## 2021-05-28 DIAGNOSIS — F419 Anxiety disorder, unspecified: Secondary | ICD-10-CM | POA: Diagnosis not present

## 2021-05-28 NOTE — Patient Outreach (Signed)
Per [employer] eligible member currently resides in [facility]. Screening for potential [facility] Care Management services as a benefit of member's insurance plan. ? ?Update received from [place], [place] indicating [name] is under long term care.  ? ?No further [facility] needs identified.  ? ? ? ?Marthenia Rolling, MSN, RN,BSN ?[place] Coordinator ?[phone] [facility]) ?[phone]  (Toll free office)   ?

## 2021-05-29 DIAGNOSIS — L89133 Pressure ulcer of right lower back, stage 3: Secondary | ICD-10-CM | POA: Diagnosis not present

## 2021-06-02 ENCOUNTER — Telehealth: Payer: Self-pay

## 2021-06-02 NOTE — Telephone Encounter (Signed)
Attempted to call pt to schedule f/u appt as requested on surgical pathology result note. Left message for pt to call back.  ?

## 2021-06-02 NOTE — Telephone Encounter (Signed)
-----   Message from Marice Potter, RN sent at 04/29/2021  9:08 AM EST ----- ?Pt needs follow up appt scheduled to discuss whether or not repeat upper endoscopy in 1 year is appropriate. ? ?

## 2021-06-03 NOTE — Telephone Encounter (Signed)
Left message for pt to call back  °

## 2021-06-05 DIAGNOSIS — L89133 Pressure ulcer of right lower back, stage 3: Secondary | ICD-10-CM | POA: Diagnosis not present

## 2021-06-11 NOTE — Telephone Encounter (Signed)
Left message for pt to call back  °

## 2021-06-11 NOTE — Telephone Encounter (Signed)
Letter mailed to pt.  

## 2021-06-25 DIAGNOSIS — I10 Essential (primary) hypertension: Secondary | ICD-10-CM | POA: Diagnosis not present

## 2021-06-25 DIAGNOSIS — J449 Chronic obstructive pulmonary disease, unspecified: Secondary | ICD-10-CM | POA: Diagnosis not present

## 2021-06-25 DIAGNOSIS — L299 Pruritus, unspecified: Secondary | ICD-10-CM | POA: Diagnosis not present

## 2021-06-25 DIAGNOSIS — N186 End stage renal disease: Secondary | ICD-10-CM | POA: Diagnosis not present

## 2021-06-25 DIAGNOSIS — F419 Anxiety disorder, unspecified: Secondary | ICD-10-CM | POA: Diagnosis not present

## 2021-06-26 DIAGNOSIS — L89134 Pressure ulcer of right lower back, stage 4: Secondary | ICD-10-CM | POA: Diagnosis not present

## 2021-06-29 DIAGNOSIS — I739 Peripheral vascular disease, unspecified: Secondary | ICD-10-CM | POA: Diagnosis not present

## 2021-06-29 DIAGNOSIS — M6259 Muscle wasting and atrophy, not elsewhere classified, multiple sites: Secondary | ICD-10-CM | POA: Diagnosis not present

## 2021-06-29 DIAGNOSIS — I679 Cerebrovascular disease, unspecified: Secondary | ICD-10-CM | POA: Diagnosis not present

## 2021-06-29 DIAGNOSIS — K861 Other chronic pancreatitis: Secondary | ICD-10-CM | POA: Diagnosis not present

## 2021-06-29 DIAGNOSIS — Z992 Dependence on renal dialysis: Secondary | ICD-10-CM | POA: Diagnosis not present

## 2021-06-29 DIAGNOSIS — R262 Difficulty in walking, not elsewhere classified: Secondary | ICD-10-CM | POA: Diagnosis not present

## 2021-06-29 DIAGNOSIS — J439 Emphysema, unspecified: Secondary | ICD-10-CM | POA: Diagnosis not present

## 2021-06-29 DIAGNOSIS — N186 End stage renal disease: Secondary | ICD-10-CM | POA: Diagnosis not present

## 2021-06-29 DIAGNOSIS — N139 Obstructive and reflux uropathy, unspecified: Secondary | ICD-10-CM | POA: Diagnosis not present

## 2021-06-29 DIAGNOSIS — D631 Anemia in chronic kidney disease: Secondary | ICD-10-CM | POA: Diagnosis not present

## 2021-06-29 DIAGNOSIS — J449 Chronic obstructive pulmonary disease, unspecified: Secondary | ICD-10-CM | POA: Diagnosis not present

## 2021-06-29 DIAGNOSIS — R2681 Unsteadiness on feet: Secondary | ICD-10-CM | POA: Diagnosis not present

## 2021-06-29 DIAGNOSIS — E1122 Type 2 diabetes mellitus with diabetic chronic kidney disease: Secondary | ICD-10-CM | POA: Diagnosis not present

## 2021-07-01 DIAGNOSIS — J449 Chronic obstructive pulmonary disease, unspecified: Secondary | ICD-10-CM | POA: Diagnosis not present

## 2021-07-01 DIAGNOSIS — N186 End stage renal disease: Secondary | ICD-10-CM | POA: Diagnosis not present

## 2021-07-01 DIAGNOSIS — L299 Pruritus, unspecified: Secondary | ICD-10-CM | POA: Diagnosis not present

## 2021-07-01 DIAGNOSIS — M6259 Muscle wasting and atrophy, not elsewhere classified, multiple sites: Secondary | ICD-10-CM | POA: Diagnosis not present

## 2021-07-01 DIAGNOSIS — E1122 Type 2 diabetes mellitus with diabetic chronic kidney disease: Secondary | ICD-10-CM | POA: Diagnosis not present

## 2021-07-01 DIAGNOSIS — F419 Anxiety disorder, unspecified: Secondary | ICD-10-CM | POA: Diagnosis not present

## 2021-07-01 DIAGNOSIS — I679 Cerebrovascular disease, unspecified: Secondary | ICD-10-CM | POA: Diagnosis not present

## 2021-07-01 DIAGNOSIS — K861 Other chronic pancreatitis: Secondary | ICD-10-CM | POA: Diagnosis not present

## 2021-07-01 DIAGNOSIS — I120 Hypertensive chronic kidney disease with stage 5 chronic kidney disease or end stage renal disease: Secondary | ICD-10-CM | POA: Diagnosis not present

## 2021-07-02 DIAGNOSIS — N186 End stage renal disease: Secondary | ICD-10-CM | POA: Diagnosis not present

## 2021-07-02 DIAGNOSIS — M6259 Muscle wasting and atrophy, not elsewhere classified, multiple sites: Secondary | ICD-10-CM | POA: Diagnosis not present

## 2021-07-02 DIAGNOSIS — J449 Chronic obstructive pulmonary disease, unspecified: Secondary | ICD-10-CM | POA: Diagnosis not present

## 2021-07-02 DIAGNOSIS — K861 Other chronic pancreatitis: Secondary | ICD-10-CM | POA: Diagnosis not present

## 2021-07-02 DIAGNOSIS — I679 Cerebrovascular disease, unspecified: Secondary | ICD-10-CM | POA: Diagnosis not present

## 2021-07-02 DIAGNOSIS — E1122 Type 2 diabetes mellitus with diabetic chronic kidney disease: Secondary | ICD-10-CM | POA: Diagnosis not present

## 2021-07-03 DIAGNOSIS — N186 End stage renal disease: Secondary | ICD-10-CM | POA: Diagnosis not present

## 2021-07-03 DIAGNOSIS — I679 Cerebrovascular disease, unspecified: Secondary | ICD-10-CM | POA: Diagnosis not present

## 2021-07-03 DIAGNOSIS — K861 Other chronic pancreatitis: Secondary | ICD-10-CM | POA: Diagnosis not present

## 2021-07-03 DIAGNOSIS — E1122 Type 2 diabetes mellitus with diabetic chronic kidney disease: Secondary | ICD-10-CM | POA: Diagnosis not present

## 2021-07-03 DIAGNOSIS — L89134 Pressure ulcer of right lower back, stage 4: Secondary | ICD-10-CM | POA: Diagnosis not present

## 2021-07-03 DIAGNOSIS — J449 Chronic obstructive pulmonary disease, unspecified: Secondary | ICD-10-CM | POA: Diagnosis not present

## 2021-07-03 DIAGNOSIS — M6259 Muscle wasting and atrophy, not elsewhere classified, multiple sites: Secondary | ICD-10-CM | POA: Diagnosis not present

## 2021-07-04 DIAGNOSIS — I679 Cerebrovascular disease, unspecified: Secondary | ICD-10-CM | POA: Diagnosis not present

## 2021-07-04 DIAGNOSIS — L299 Pruritus, unspecified: Secondary | ICD-10-CM | POA: Diagnosis not present

## 2021-07-04 DIAGNOSIS — N186 End stage renal disease: Secondary | ICD-10-CM | POA: Diagnosis not present

## 2021-07-04 DIAGNOSIS — E1122 Type 2 diabetes mellitus with diabetic chronic kidney disease: Secondary | ICD-10-CM | POA: Diagnosis not present

## 2021-07-04 DIAGNOSIS — J449 Chronic obstructive pulmonary disease, unspecified: Secondary | ICD-10-CM | POA: Diagnosis not present

## 2021-07-04 DIAGNOSIS — M6259 Muscle wasting and atrophy, not elsewhere classified, multiple sites: Secondary | ICD-10-CM | POA: Diagnosis not present

## 2021-07-04 DIAGNOSIS — M6281 Muscle weakness (generalized): Secondary | ICD-10-CM | POA: Diagnosis not present

## 2021-07-04 DIAGNOSIS — F419 Anxiety disorder, unspecified: Secondary | ICD-10-CM | POA: Diagnosis not present

## 2021-07-04 DIAGNOSIS — K861 Other chronic pancreatitis: Secondary | ICD-10-CM | POA: Diagnosis not present

## 2021-07-05 DIAGNOSIS — I679 Cerebrovascular disease, unspecified: Secondary | ICD-10-CM | POA: Diagnosis not present

## 2021-07-05 DIAGNOSIS — J449 Chronic obstructive pulmonary disease, unspecified: Secondary | ICD-10-CM | POA: Diagnosis not present

## 2021-07-05 DIAGNOSIS — K861 Other chronic pancreatitis: Secondary | ICD-10-CM | POA: Diagnosis not present

## 2021-07-05 DIAGNOSIS — M6259 Muscle wasting and atrophy, not elsewhere classified, multiple sites: Secondary | ICD-10-CM | POA: Diagnosis not present

## 2021-07-05 DIAGNOSIS — E1122 Type 2 diabetes mellitus with diabetic chronic kidney disease: Secondary | ICD-10-CM | POA: Diagnosis not present

## 2021-07-05 DIAGNOSIS — N186 End stage renal disease: Secondary | ICD-10-CM | POA: Diagnosis not present

## 2021-07-06 DIAGNOSIS — M6259 Muscle wasting and atrophy, not elsewhere classified, multiple sites: Secondary | ICD-10-CM | POA: Diagnosis not present

## 2021-07-06 DIAGNOSIS — I679 Cerebrovascular disease, unspecified: Secondary | ICD-10-CM | POA: Diagnosis not present

## 2021-07-06 DIAGNOSIS — E1122 Type 2 diabetes mellitus with diabetic chronic kidney disease: Secondary | ICD-10-CM | POA: Diagnosis not present

## 2021-07-06 DIAGNOSIS — N186 End stage renal disease: Secondary | ICD-10-CM | POA: Diagnosis not present

## 2021-07-06 DIAGNOSIS — J449 Chronic obstructive pulmonary disease, unspecified: Secondary | ICD-10-CM | POA: Diagnosis not present

## 2021-07-06 DIAGNOSIS — K861 Other chronic pancreatitis: Secondary | ICD-10-CM | POA: Diagnosis not present

## 2021-07-07 DIAGNOSIS — N186 End stage renal disease: Secondary | ICD-10-CM | POA: Diagnosis not present

## 2021-07-07 DIAGNOSIS — J449 Chronic obstructive pulmonary disease, unspecified: Secondary | ICD-10-CM | POA: Diagnosis not present

## 2021-07-07 DIAGNOSIS — I679 Cerebrovascular disease, unspecified: Secondary | ICD-10-CM | POA: Diagnosis not present

## 2021-07-07 DIAGNOSIS — E1122 Type 2 diabetes mellitus with diabetic chronic kidney disease: Secondary | ICD-10-CM | POA: Diagnosis not present

## 2021-07-07 DIAGNOSIS — M6259 Muscle wasting and atrophy, not elsewhere classified, multiple sites: Secondary | ICD-10-CM | POA: Diagnosis not present

## 2021-07-07 DIAGNOSIS — K861 Other chronic pancreatitis: Secondary | ICD-10-CM | POA: Diagnosis not present

## 2021-07-08 DIAGNOSIS — I10 Essential (primary) hypertension: Secondary | ICD-10-CM | POA: Diagnosis not present

## 2021-07-08 DIAGNOSIS — N186 End stage renal disease: Secondary | ICD-10-CM | POA: Diagnosis not present

## 2021-07-08 DIAGNOSIS — M6259 Muscle wasting and atrophy, not elsewhere classified, multiple sites: Secondary | ICD-10-CM | POA: Diagnosis not present

## 2021-07-08 DIAGNOSIS — E1122 Type 2 diabetes mellitus with diabetic chronic kidney disease: Secondary | ICD-10-CM | POA: Diagnosis not present

## 2021-07-08 DIAGNOSIS — I679 Cerebrovascular disease, unspecified: Secondary | ICD-10-CM | POA: Diagnosis not present

## 2021-07-08 DIAGNOSIS — M6281 Muscle weakness (generalized): Secondary | ICD-10-CM | POA: Diagnosis not present

## 2021-07-08 DIAGNOSIS — F419 Anxiety disorder, unspecified: Secondary | ICD-10-CM | POA: Diagnosis not present

## 2021-07-08 DIAGNOSIS — J449 Chronic obstructive pulmonary disease, unspecified: Secondary | ICD-10-CM | POA: Diagnosis not present

## 2021-07-08 DIAGNOSIS — K861 Other chronic pancreatitis: Secondary | ICD-10-CM | POA: Diagnosis not present

## 2021-07-09 DIAGNOSIS — N390 Urinary tract infection, site not specified: Secondary | ICD-10-CM | POA: Diagnosis not present

## 2021-07-09 DIAGNOSIS — L89134 Pressure ulcer of right lower back, stage 4: Secondary | ICD-10-CM | POA: Diagnosis not present

## 2021-07-09 DIAGNOSIS — J449 Chronic obstructive pulmonary disease, unspecified: Secondary | ICD-10-CM | POA: Diagnosis not present

## 2021-07-09 DIAGNOSIS — I679 Cerebrovascular disease, unspecified: Secondary | ICD-10-CM | POA: Diagnosis not present

## 2021-07-09 DIAGNOSIS — M6259 Muscle wasting and atrophy, not elsewhere classified, multiple sites: Secondary | ICD-10-CM | POA: Diagnosis not present

## 2021-07-09 DIAGNOSIS — N186 End stage renal disease: Secondary | ICD-10-CM | POA: Diagnosis not present

## 2021-07-09 DIAGNOSIS — E1122 Type 2 diabetes mellitus with diabetic chronic kidney disease: Secondary | ICD-10-CM | POA: Diagnosis not present

## 2021-07-09 DIAGNOSIS — K861 Other chronic pancreatitis: Secondary | ICD-10-CM | POA: Diagnosis not present

## 2021-07-10 DIAGNOSIS — I120 Hypertensive chronic kidney disease with stage 5 chronic kidney disease or end stage renal disease: Secondary | ICD-10-CM | POA: Diagnosis not present

## 2021-07-10 DIAGNOSIS — N390 Urinary tract infection, site not specified: Secondary | ICD-10-CM | POA: Diagnosis not present

## 2021-07-10 DIAGNOSIS — J449 Chronic obstructive pulmonary disease, unspecified: Secondary | ICD-10-CM | POA: Diagnosis not present

## 2021-07-10 DIAGNOSIS — E1122 Type 2 diabetes mellitus with diabetic chronic kidney disease: Secondary | ICD-10-CM | POA: Diagnosis not present

## 2021-07-10 DIAGNOSIS — M6259 Muscle wasting and atrophy, not elsewhere classified, multiple sites: Secondary | ICD-10-CM | POA: Diagnosis not present

## 2021-07-10 DIAGNOSIS — N186 End stage renal disease: Secondary | ICD-10-CM | POA: Diagnosis not present

## 2021-07-10 DIAGNOSIS — I679 Cerebrovascular disease, unspecified: Secondary | ICD-10-CM | POA: Diagnosis not present

## 2021-07-10 DIAGNOSIS — K861 Other chronic pancreatitis: Secondary | ICD-10-CM | POA: Diagnosis not present

## 2021-07-10 DIAGNOSIS — F419 Anxiety disorder, unspecified: Secondary | ICD-10-CM | POA: Diagnosis not present

## 2021-07-13 DIAGNOSIS — K861 Other chronic pancreatitis: Secondary | ICD-10-CM | POA: Diagnosis not present

## 2021-07-13 DIAGNOSIS — M6259 Muscle wasting and atrophy, not elsewhere classified, multiple sites: Secondary | ICD-10-CM | POA: Diagnosis not present

## 2021-07-13 DIAGNOSIS — I679 Cerebrovascular disease, unspecified: Secondary | ICD-10-CM | POA: Diagnosis not present

## 2021-07-13 DIAGNOSIS — N186 End stage renal disease: Secondary | ICD-10-CM | POA: Diagnosis not present

## 2021-07-13 DIAGNOSIS — E1122 Type 2 diabetes mellitus with diabetic chronic kidney disease: Secondary | ICD-10-CM | POA: Diagnosis not present

## 2021-07-13 DIAGNOSIS — J449 Chronic obstructive pulmonary disease, unspecified: Secondary | ICD-10-CM | POA: Diagnosis not present

## 2021-07-14 DIAGNOSIS — K861 Other chronic pancreatitis: Secondary | ICD-10-CM | POA: Diagnosis not present

## 2021-07-14 DIAGNOSIS — J449 Chronic obstructive pulmonary disease, unspecified: Secondary | ICD-10-CM | POA: Diagnosis not present

## 2021-07-14 DIAGNOSIS — I679 Cerebrovascular disease, unspecified: Secondary | ICD-10-CM | POA: Diagnosis not present

## 2021-07-14 DIAGNOSIS — N186 End stage renal disease: Secondary | ICD-10-CM | POA: Diagnosis not present

## 2021-07-14 DIAGNOSIS — M6259 Muscle wasting and atrophy, not elsewhere classified, multiple sites: Secondary | ICD-10-CM | POA: Diagnosis not present

## 2021-07-14 DIAGNOSIS — E1122 Type 2 diabetes mellitus with diabetic chronic kidney disease: Secondary | ICD-10-CM | POA: Diagnosis not present

## 2021-07-15 ENCOUNTER — Ambulatory Visit: Payer: Medicare Other | Admitting: Podiatry

## 2021-07-15 DIAGNOSIS — M6259 Muscle wasting and atrophy, not elsewhere classified, multiple sites: Secondary | ICD-10-CM | POA: Diagnosis not present

## 2021-07-15 DIAGNOSIS — K861 Other chronic pancreatitis: Secondary | ICD-10-CM | POA: Diagnosis not present

## 2021-07-15 DIAGNOSIS — N186 End stage renal disease: Secondary | ICD-10-CM | POA: Diagnosis not present

## 2021-07-15 DIAGNOSIS — E1122 Type 2 diabetes mellitus with diabetic chronic kidney disease: Secondary | ICD-10-CM | POA: Diagnosis not present

## 2021-07-15 DIAGNOSIS — I679 Cerebrovascular disease, unspecified: Secondary | ICD-10-CM | POA: Diagnosis not present

## 2021-07-15 DIAGNOSIS — J449 Chronic obstructive pulmonary disease, unspecified: Secondary | ICD-10-CM | POA: Diagnosis not present

## 2021-07-16 DIAGNOSIS — I120 Hypertensive chronic kidney disease with stage 5 chronic kidney disease or end stage renal disease: Secondary | ICD-10-CM | POA: Diagnosis not present

## 2021-07-16 DIAGNOSIS — R0602 Shortness of breath: Secondary | ICD-10-CM | POA: Diagnosis not present

## 2021-07-16 DIAGNOSIS — R062 Wheezing: Secondary | ICD-10-CM | POA: Diagnosis not present

## 2021-07-16 DIAGNOSIS — R918 Other nonspecific abnormal finding of lung field: Secondary | ICD-10-CM | POA: Diagnosis not present

## 2021-07-16 DIAGNOSIS — F419 Anxiety disorder, unspecified: Secondary | ICD-10-CM | POA: Diagnosis not present

## 2021-07-16 DIAGNOSIS — J449 Chronic obstructive pulmonary disease, unspecified: Secondary | ICD-10-CM | POA: Diagnosis not present

## 2021-07-17 DIAGNOSIS — E1122 Type 2 diabetes mellitus with diabetic chronic kidney disease: Secondary | ICD-10-CM | POA: Diagnosis not present

## 2021-07-17 DIAGNOSIS — M6259 Muscle wasting and atrophy, not elsewhere classified, multiple sites: Secondary | ICD-10-CM | POA: Diagnosis not present

## 2021-07-17 DIAGNOSIS — F419 Anxiety disorder, unspecified: Secondary | ICD-10-CM | POA: Diagnosis not present

## 2021-07-17 DIAGNOSIS — M6281 Muscle weakness (generalized): Secondary | ICD-10-CM | POA: Diagnosis not present

## 2021-07-17 DIAGNOSIS — L89134 Pressure ulcer of right lower back, stage 4: Secondary | ICD-10-CM | POA: Diagnosis not present

## 2021-07-17 DIAGNOSIS — J449 Chronic obstructive pulmonary disease, unspecified: Secondary | ICD-10-CM | POA: Diagnosis not present

## 2021-07-17 DIAGNOSIS — N186 End stage renal disease: Secondary | ICD-10-CM | POA: Diagnosis not present

## 2021-07-17 DIAGNOSIS — K861 Other chronic pancreatitis: Secondary | ICD-10-CM | POA: Diagnosis not present

## 2021-07-17 DIAGNOSIS — I10 Essential (primary) hypertension: Secondary | ICD-10-CM | POA: Diagnosis not present

## 2021-07-17 DIAGNOSIS — I679 Cerebrovascular disease, unspecified: Secondary | ICD-10-CM | POA: Diagnosis not present

## 2021-07-18 DIAGNOSIS — J449 Chronic obstructive pulmonary disease, unspecified: Secondary | ICD-10-CM | POA: Diagnosis not present

## 2021-07-18 DIAGNOSIS — M6259 Muscle wasting and atrophy, not elsewhere classified, multiple sites: Secondary | ICD-10-CM | POA: Diagnosis not present

## 2021-07-18 DIAGNOSIS — I679 Cerebrovascular disease, unspecified: Secondary | ICD-10-CM | POA: Diagnosis not present

## 2021-07-18 DIAGNOSIS — K861 Other chronic pancreatitis: Secondary | ICD-10-CM | POA: Diagnosis not present

## 2021-07-18 DIAGNOSIS — N186 End stage renal disease: Secondary | ICD-10-CM | POA: Diagnosis not present

## 2021-07-18 DIAGNOSIS — E1122 Type 2 diabetes mellitus with diabetic chronic kidney disease: Secondary | ICD-10-CM | POA: Diagnosis not present

## 2021-07-20 DIAGNOSIS — N186 End stage renal disease: Secondary | ICD-10-CM | POA: Diagnosis not present

## 2021-07-20 DIAGNOSIS — M6259 Muscle wasting and atrophy, not elsewhere classified, multiple sites: Secondary | ICD-10-CM | POA: Diagnosis not present

## 2021-07-20 DIAGNOSIS — J449 Chronic obstructive pulmonary disease, unspecified: Secondary | ICD-10-CM | POA: Diagnosis not present

## 2021-07-20 DIAGNOSIS — I679 Cerebrovascular disease, unspecified: Secondary | ICD-10-CM | POA: Diagnosis not present

## 2021-07-20 DIAGNOSIS — K861 Other chronic pancreatitis: Secondary | ICD-10-CM | POA: Diagnosis not present

## 2021-07-20 DIAGNOSIS — E1122 Type 2 diabetes mellitus with diabetic chronic kidney disease: Secondary | ICD-10-CM | POA: Diagnosis not present

## 2021-07-22 DIAGNOSIS — K861 Other chronic pancreatitis: Secondary | ICD-10-CM | POA: Diagnosis not present

## 2021-07-22 DIAGNOSIS — J449 Chronic obstructive pulmonary disease, unspecified: Secondary | ICD-10-CM | POA: Diagnosis not present

## 2021-07-22 DIAGNOSIS — E1122 Type 2 diabetes mellitus with diabetic chronic kidney disease: Secondary | ICD-10-CM | POA: Diagnosis not present

## 2021-07-22 DIAGNOSIS — I679 Cerebrovascular disease, unspecified: Secondary | ICD-10-CM | POA: Diagnosis not present

## 2021-07-22 DIAGNOSIS — N186 End stage renal disease: Secondary | ICD-10-CM | POA: Diagnosis not present

## 2021-07-22 DIAGNOSIS — M6259 Muscle wasting and atrophy, not elsewhere classified, multiple sites: Secondary | ICD-10-CM | POA: Diagnosis not present

## 2021-07-23 DIAGNOSIS — J189 Pneumonia, unspecified organism: Secondary | ICD-10-CM | POA: Diagnosis not present

## 2021-07-23 DIAGNOSIS — K861 Other chronic pancreatitis: Secondary | ICD-10-CM | POA: Diagnosis not present

## 2021-07-23 DIAGNOSIS — I120 Hypertensive chronic kidney disease with stage 5 chronic kidney disease or end stage renal disease: Secondary | ICD-10-CM | POA: Diagnosis not present

## 2021-07-23 DIAGNOSIS — N186 End stage renal disease: Secondary | ICD-10-CM | POA: Diagnosis not present

## 2021-07-23 DIAGNOSIS — I679 Cerebrovascular disease, unspecified: Secondary | ICD-10-CM | POA: Diagnosis not present

## 2021-07-23 DIAGNOSIS — F419 Anxiety disorder, unspecified: Secondary | ICD-10-CM | POA: Diagnosis not present

## 2021-07-23 DIAGNOSIS — E1122 Type 2 diabetes mellitus with diabetic chronic kidney disease: Secondary | ICD-10-CM | POA: Diagnosis not present

## 2021-07-23 DIAGNOSIS — M6259 Muscle wasting and atrophy, not elsewhere classified, multiple sites: Secondary | ICD-10-CM | POA: Diagnosis not present

## 2021-07-23 DIAGNOSIS — J449 Chronic obstructive pulmonary disease, unspecified: Secondary | ICD-10-CM | POA: Diagnosis not present

## 2021-07-24 DIAGNOSIS — N139 Obstructive and reflux uropathy, unspecified: Secondary | ICD-10-CM | POA: Diagnosis not present

## 2021-07-24 DIAGNOSIS — R2681 Unsteadiness on feet: Secondary | ICD-10-CM | POA: Diagnosis not present

## 2021-07-24 DIAGNOSIS — I679 Cerebrovascular disease, unspecified: Secondary | ICD-10-CM | POA: Diagnosis not present

## 2021-07-24 DIAGNOSIS — E1122 Type 2 diabetes mellitus with diabetic chronic kidney disease: Secondary | ICD-10-CM | POA: Diagnosis not present

## 2021-07-24 DIAGNOSIS — R278 Other lack of coordination: Secondary | ICD-10-CM | POA: Diagnosis not present

## 2021-07-24 DIAGNOSIS — N186 End stage renal disease: Secondary | ICD-10-CM | POA: Diagnosis not present

## 2021-07-24 DIAGNOSIS — J439 Emphysema, unspecified: Secondary | ICD-10-CM | POA: Diagnosis not present

## 2021-07-24 DIAGNOSIS — M6259 Muscle wasting and atrophy, not elsewhere classified, multiple sites: Secondary | ICD-10-CM | POA: Diagnosis not present

## 2021-07-24 DIAGNOSIS — I739 Peripheral vascular disease, unspecified: Secondary | ICD-10-CM | POA: Diagnosis not present

## 2021-07-24 DIAGNOSIS — L89134 Pressure ulcer of right lower back, stage 4: Secondary | ICD-10-CM | POA: Diagnosis not present

## 2021-07-24 DIAGNOSIS — Z992 Dependence on renal dialysis: Secondary | ICD-10-CM | POA: Diagnosis not present

## 2021-07-24 DIAGNOSIS — J449 Chronic obstructive pulmonary disease, unspecified: Secondary | ICD-10-CM | POA: Diagnosis not present

## 2021-07-24 DIAGNOSIS — M6281 Muscle weakness (generalized): Secondary | ICD-10-CM | POA: Diagnosis not present

## 2021-07-24 DIAGNOSIS — K861 Other chronic pancreatitis: Secondary | ICD-10-CM | POA: Diagnosis not present

## 2021-07-24 DIAGNOSIS — R262 Difficulty in walking, not elsewhere classified: Secondary | ICD-10-CM | POA: Diagnosis not present

## 2021-07-24 DIAGNOSIS — D631 Anemia in chronic kidney disease: Secondary | ICD-10-CM | POA: Diagnosis not present

## 2021-07-24 DIAGNOSIS — R293 Abnormal posture: Secondary | ICD-10-CM | POA: Diagnosis not present

## 2021-07-26 DIAGNOSIS — E1122 Type 2 diabetes mellitus with diabetic chronic kidney disease: Secondary | ICD-10-CM | POA: Diagnosis not present

## 2021-07-26 DIAGNOSIS — R262 Difficulty in walking, not elsewhere classified: Secondary | ICD-10-CM | POA: Diagnosis not present

## 2021-07-26 DIAGNOSIS — K861 Other chronic pancreatitis: Secondary | ICD-10-CM | POA: Diagnosis not present

## 2021-07-26 DIAGNOSIS — I679 Cerebrovascular disease, unspecified: Secondary | ICD-10-CM | POA: Diagnosis not present

## 2021-07-26 DIAGNOSIS — J449 Chronic obstructive pulmonary disease, unspecified: Secondary | ICD-10-CM | POA: Diagnosis not present

## 2021-07-26 DIAGNOSIS — N186 End stage renal disease: Secondary | ICD-10-CM | POA: Diagnosis not present

## 2021-07-27 DIAGNOSIS — J449 Chronic obstructive pulmonary disease, unspecified: Secondary | ICD-10-CM | POA: Diagnosis not present

## 2021-07-27 DIAGNOSIS — R262 Difficulty in walking, not elsewhere classified: Secondary | ICD-10-CM | POA: Diagnosis not present

## 2021-07-27 DIAGNOSIS — I679 Cerebrovascular disease, unspecified: Secondary | ICD-10-CM | POA: Diagnosis not present

## 2021-07-27 DIAGNOSIS — N186 End stage renal disease: Secondary | ICD-10-CM | POA: Diagnosis not present

## 2021-07-27 DIAGNOSIS — K861 Other chronic pancreatitis: Secondary | ICD-10-CM | POA: Diagnosis not present

## 2021-07-27 DIAGNOSIS — E1122 Type 2 diabetes mellitus with diabetic chronic kidney disease: Secondary | ICD-10-CM | POA: Diagnosis not present

## 2021-07-28 DIAGNOSIS — N186 End stage renal disease: Secondary | ICD-10-CM | POA: Diagnosis not present

## 2021-07-28 DIAGNOSIS — J449 Chronic obstructive pulmonary disease, unspecified: Secondary | ICD-10-CM | POA: Diagnosis not present

## 2021-07-28 DIAGNOSIS — I679 Cerebrovascular disease, unspecified: Secondary | ICD-10-CM | POA: Diagnosis not present

## 2021-07-28 DIAGNOSIS — R262 Difficulty in walking, not elsewhere classified: Secondary | ICD-10-CM | POA: Diagnosis not present

## 2021-07-28 DIAGNOSIS — E1122 Type 2 diabetes mellitus with diabetic chronic kidney disease: Secondary | ICD-10-CM | POA: Diagnosis not present

## 2021-07-28 DIAGNOSIS — K861 Other chronic pancreatitis: Secondary | ICD-10-CM | POA: Diagnosis not present

## 2021-07-29 ENCOUNTER — Encounter (HOSPITAL_COMMUNITY): Payer: No Typology Code available for payment source

## 2021-07-29 ENCOUNTER — Ambulatory Visit: Payer: Medicare Other | Admitting: Vascular Surgery

## 2021-07-29 DIAGNOSIS — I679 Cerebrovascular disease, unspecified: Secondary | ICD-10-CM | POA: Diagnosis not present

## 2021-07-29 DIAGNOSIS — J189 Pneumonia, unspecified organism: Secondary | ICD-10-CM | POA: Diagnosis not present

## 2021-07-29 DIAGNOSIS — J449 Chronic obstructive pulmonary disease, unspecified: Secondary | ICD-10-CM | POA: Diagnosis not present

## 2021-07-29 DIAGNOSIS — R262 Difficulty in walking, not elsewhere classified: Secondary | ICD-10-CM | POA: Diagnosis not present

## 2021-07-29 DIAGNOSIS — F419 Anxiety disorder, unspecified: Secondary | ICD-10-CM | POA: Diagnosis not present

## 2021-07-29 DIAGNOSIS — I10 Essential (primary) hypertension: Secondary | ICD-10-CM | POA: Diagnosis not present

## 2021-07-29 DIAGNOSIS — N186 End stage renal disease: Secondary | ICD-10-CM | POA: Diagnosis not present

## 2021-07-29 DIAGNOSIS — K861 Other chronic pancreatitis: Secondary | ICD-10-CM | POA: Diagnosis not present

## 2021-07-29 DIAGNOSIS — E1122 Type 2 diabetes mellitus with diabetic chronic kidney disease: Secondary | ICD-10-CM | POA: Diagnosis not present

## 2021-07-29 DIAGNOSIS — I120 Hypertensive chronic kidney disease with stage 5 chronic kidney disease or end stage renal disease: Secondary | ICD-10-CM | POA: Diagnosis not present

## 2021-07-30 DIAGNOSIS — K861 Other chronic pancreatitis: Secondary | ICD-10-CM | POA: Diagnosis not present

## 2021-07-30 DIAGNOSIS — E1122 Type 2 diabetes mellitus with diabetic chronic kidney disease: Secondary | ICD-10-CM | POA: Diagnosis not present

## 2021-07-30 DIAGNOSIS — N186 End stage renal disease: Secondary | ICD-10-CM | POA: Diagnosis not present

## 2021-07-30 DIAGNOSIS — R262 Difficulty in walking, not elsewhere classified: Secondary | ICD-10-CM | POA: Diagnosis not present

## 2021-07-30 DIAGNOSIS — J449 Chronic obstructive pulmonary disease, unspecified: Secondary | ICD-10-CM | POA: Diagnosis not present

## 2021-07-30 DIAGNOSIS — I679 Cerebrovascular disease, unspecified: Secondary | ICD-10-CM | POA: Diagnosis not present

## 2021-07-31 DIAGNOSIS — I679 Cerebrovascular disease, unspecified: Secondary | ICD-10-CM | POA: Diagnosis not present

## 2021-07-31 DIAGNOSIS — N186 End stage renal disease: Secondary | ICD-10-CM | POA: Diagnosis not present

## 2021-07-31 DIAGNOSIS — E1122 Type 2 diabetes mellitus with diabetic chronic kidney disease: Secondary | ICD-10-CM | POA: Diagnosis not present

## 2021-07-31 DIAGNOSIS — R262 Difficulty in walking, not elsewhere classified: Secondary | ICD-10-CM | POA: Diagnosis not present

## 2021-07-31 DIAGNOSIS — J449 Chronic obstructive pulmonary disease, unspecified: Secondary | ICD-10-CM | POA: Diagnosis not present

## 2021-07-31 DIAGNOSIS — L89134 Pressure ulcer of right lower back, stage 4: Secondary | ICD-10-CM | POA: Diagnosis not present

## 2021-07-31 DIAGNOSIS — K861 Other chronic pancreatitis: Secondary | ICD-10-CM | POA: Diagnosis not present

## 2021-08-01 DIAGNOSIS — K861 Other chronic pancreatitis: Secondary | ICD-10-CM | POA: Diagnosis not present

## 2021-08-01 DIAGNOSIS — N186 End stage renal disease: Secondary | ICD-10-CM | POA: Diagnosis not present

## 2021-08-01 DIAGNOSIS — J449 Chronic obstructive pulmonary disease, unspecified: Secondary | ICD-10-CM | POA: Diagnosis not present

## 2021-08-01 DIAGNOSIS — I679 Cerebrovascular disease, unspecified: Secondary | ICD-10-CM | POA: Diagnosis not present

## 2021-08-01 DIAGNOSIS — R262 Difficulty in walking, not elsewhere classified: Secondary | ICD-10-CM | POA: Diagnosis not present

## 2021-08-01 DIAGNOSIS — E1122 Type 2 diabetes mellitus with diabetic chronic kidney disease: Secondary | ICD-10-CM | POA: Diagnosis not present

## 2021-08-02 DIAGNOSIS — R262 Difficulty in walking, not elsewhere classified: Secondary | ICD-10-CM | POA: Diagnosis not present

## 2021-08-02 DIAGNOSIS — N186 End stage renal disease: Secondary | ICD-10-CM | POA: Diagnosis not present

## 2021-08-02 DIAGNOSIS — I679 Cerebrovascular disease, unspecified: Secondary | ICD-10-CM | POA: Diagnosis not present

## 2021-08-02 DIAGNOSIS — J449 Chronic obstructive pulmonary disease, unspecified: Secondary | ICD-10-CM | POA: Diagnosis not present

## 2021-08-02 DIAGNOSIS — E1122 Type 2 diabetes mellitus with diabetic chronic kidney disease: Secondary | ICD-10-CM | POA: Diagnosis not present

## 2021-08-02 DIAGNOSIS — K861 Other chronic pancreatitis: Secondary | ICD-10-CM | POA: Diagnosis not present

## 2021-08-03 DIAGNOSIS — E1122 Type 2 diabetes mellitus with diabetic chronic kidney disease: Secondary | ICD-10-CM | POA: Diagnosis not present

## 2021-08-03 DIAGNOSIS — N186 End stage renal disease: Secondary | ICD-10-CM | POA: Diagnosis not present

## 2021-08-03 DIAGNOSIS — J449 Chronic obstructive pulmonary disease, unspecified: Secondary | ICD-10-CM | POA: Diagnosis not present

## 2021-08-03 DIAGNOSIS — R262 Difficulty in walking, not elsewhere classified: Secondary | ICD-10-CM | POA: Diagnosis not present

## 2021-08-03 DIAGNOSIS — I679 Cerebrovascular disease, unspecified: Secondary | ICD-10-CM | POA: Diagnosis not present

## 2021-08-03 DIAGNOSIS — K861 Other chronic pancreatitis: Secondary | ICD-10-CM | POA: Diagnosis not present

## 2021-08-04 DIAGNOSIS — N186 End stage renal disease: Secondary | ICD-10-CM | POA: Diagnosis not present

## 2021-08-04 DIAGNOSIS — R262 Difficulty in walking, not elsewhere classified: Secondary | ICD-10-CM | POA: Diagnosis not present

## 2021-08-04 DIAGNOSIS — K861 Other chronic pancreatitis: Secondary | ICD-10-CM | POA: Diagnosis not present

## 2021-08-04 DIAGNOSIS — J449 Chronic obstructive pulmonary disease, unspecified: Secondary | ICD-10-CM | POA: Diagnosis not present

## 2021-08-04 DIAGNOSIS — I679 Cerebrovascular disease, unspecified: Secondary | ICD-10-CM | POA: Diagnosis not present

## 2021-08-04 DIAGNOSIS — E1122 Type 2 diabetes mellitus with diabetic chronic kidney disease: Secondary | ICD-10-CM | POA: Diagnosis not present

## 2021-08-05 ENCOUNTER — Ambulatory Visit (HOSPITAL_COMMUNITY)
Admission: RE | Admit: 2021-08-05 | Discharge: 2021-08-05 | Disposition: A | Discharge status: Home / Self Care | Payer: Medicare Other | Source: Ambulatory Visit | Attending: Vascular Surgery | Admitting: Vascular Surgery

## 2021-08-05 DIAGNOSIS — Z992 Dependence on renal dialysis: Secondary | ICD-10-CM | POA: Insufficient documentation

## 2021-08-05 DIAGNOSIS — I679 Cerebrovascular disease, unspecified: Secondary | ICD-10-CM | POA: Diagnosis not present

## 2021-08-05 DIAGNOSIS — K861 Other chronic pancreatitis: Secondary | ICD-10-CM | POA: Diagnosis not present

## 2021-08-05 DIAGNOSIS — E1122 Type 2 diabetes mellitus with diabetic chronic kidney disease: Secondary | ICD-10-CM | POA: Diagnosis not present

## 2021-08-05 DIAGNOSIS — R262 Difficulty in walking, not elsewhere classified: Secondary | ICD-10-CM | POA: Diagnosis not present

## 2021-08-05 DIAGNOSIS — N186 End stage renal disease: Secondary | ICD-10-CM | POA: Diagnosis not present

## 2021-08-05 DIAGNOSIS — J449 Chronic obstructive pulmonary disease, unspecified: Secondary | ICD-10-CM | POA: Diagnosis not present

## 2021-08-06 DIAGNOSIS — J449 Chronic obstructive pulmonary disease, unspecified: Secondary | ICD-10-CM | POA: Diagnosis not present

## 2021-08-06 DIAGNOSIS — R262 Difficulty in walking, not elsewhere classified: Secondary | ICD-10-CM | POA: Diagnosis not present

## 2021-08-06 DIAGNOSIS — N186 End stage renal disease: Secondary | ICD-10-CM | POA: Diagnosis not present

## 2021-08-06 DIAGNOSIS — I679 Cerebrovascular disease, unspecified: Secondary | ICD-10-CM | POA: Diagnosis not present

## 2021-08-06 DIAGNOSIS — K861 Other chronic pancreatitis: Secondary | ICD-10-CM | POA: Diagnosis not present

## 2021-08-06 DIAGNOSIS — E1122 Type 2 diabetes mellitus with diabetic chronic kidney disease: Secondary | ICD-10-CM | POA: Diagnosis not present

## 2021-08-07 DIAGNOSIS — N186 End stage renal disease: Secondary | ICD-10-CM | POA: Diagnosis not present

## 2021-08-07 DIAGNOSIS — E1122 Type 2 diabetes mellitus with diabetic chronic kidney disease: Secondary | ICD-10-CM | POA: Diagnosis not present

## 2021-08-07 DIAGNOSIS — R262 Difficulty in walking, not elsewhere classified: Secondary | ICD-10-CM | POA: Diagnosis not present

## 2021-08-07 DIAGNOSIS — J449 Chronic obstructive pulmonary disease, unspecified: Secondary | ICD-10-CM | POA: Diagnosis not present

## 2021-08-07 DIAGNOSIS — I679 Cerebrovascular disease, unspecified: Secondary | ICD-10-CM | POA: Diagnosis not present

## 2021-08-07 DIAGNOSIS — L89134 Pressure ulcer of right lower back, stage 4: Secondary | ICD-10-CM | POA: Diagnosis not present

## 2021-08-07 DIAGNOSIS — K861 Other chronic pancreatitis: Secondary | ICD-10-CM | POA: Diagnosis not present

## 2021-08-08 DIAGNOSIS — R262 Difficulty in walking, not elsewhere classified: Secondary | ICD-10-CM | POA: Diagnosis not present

## 2021-08-08 DIAGNOSIS — J449 Chronic obstructive pulmonary disease, unspecified: Secondary | ICD-10-CM | POA: Diagnosis not present

## 2021-08-08 DIAGNOSIS — N186 End stage renal disease: Secondary | ICD-10-CM | POA: Diagnosis not present

## 2021-08-08 DIAGNOSIS — E1122 Type 2 diabetes mellitus with diabetic chronic kidney disease: Secondary | ICD-10-CM | POA: Diagnosis not present

## 2021-08-08 DIAGNOSIS — I679 Cerebrovascular disease, unspecified: Secondary | ICD-10-CM | POA: Diagnosis not present

## 2021-08-08 DIAGNOSIS — K861 Other chronic pancreatitis: Secondary | ICD-10-CM | POA: Diagnosis not present

## 2021-08-10 DIAGNOSIS — J449 Chronic obstructive pulmonary disease, unspecified: Secondary | ICD-10-CM | POA: Diagnosis not present

## 2021-08-10 DIAGNOSIS — R262 Difficulty in walking, not elsewhere classified: Secondary | ICD-10-CM | POA: Diagnosis not present

## 2021-08-10 DIAGNOSIS — I679 Cerebrovascular disease, unspecified: Secondary | ICD-10-CM | POA: Diagnosis not present

## 2021-08-10 DIAGNOSIS — N186 End stage renal disease: Secondary | ICD-10-CM | POA: Diagnosis not present

## 2021-08-10 DIAGNOSIS — E1122 Type 2 diabetes mellitus with diabetic chronic kidney disease: Secondary | ICD-10-CM | POA: Diagnosis not present

## 2021-08-10 DIAGNOSIS — K861 Other chronic pancreatitis: Secondary | ICD-10-CM | POA: Diagnosis not present

## 2021-08-11 DIAGNOSIS — R262 Difficulty in walking, not elsewhere classified: Secondary | ICD-10-CM | POA: Diagnosis not present

## 2021-08-11 DIAGNOSIS — K861 Other chronic pancreatitis: Secondary | ICD-10-CM | POA: Diagnosis not present

## 2021-08-11 DIAGNOSIS — E1122 Type 2 diabetes mellitus with diabetic chronic kidney disease: Secondary | ICD-10-CM | POA: Diagnosis not present

## 2021-08-11 DIAGNOSIS — N186 End stage renal disease: Secondary | ICD-10-CM | POA: Diagnosis not present

## 2021-08-11 DIAGNOSIS — J449 Chronic obstructive pulmonary disease, unspecified: Secondary | ICD-10-CM | POA: Diagnosis not present

## 2021-08-11 DIAGNOSIS — I679 Cerebrovascular disease, unspecified: Secondary | ICD-10-CM | POA: Diagnosis not present

## 2021-08-12 DIAGNOSIS — E1122 Type 2 diabetes mellitus with diabetic chronic kidney disease: Secondary | ICD-10-CM | POA: Diagnosis not present

## 2021-08-12 DIAGNOSIS — I679 Cerebrovascular disease, unspecified: Secondary | ICD-10-CM | POA: Diagnosis not present

## 2021-08-12 DIAGNOSIS — K861 Other chronic pancreatitis: Secondary | ICD-10-CM | POA: Diagnosis not present

## 2021-08-12 DIAGNOSIS — J449 Chronic obstructive pulmonary disease, unspecified: Secondary | ICD-10-CM | POA: Diagnosis not present

## 2021-08-12 DIAGNOSIS — R262 Difficulty in walking, not elsewhere classified: Secondary | ICD-10-CM | POA: Diagnosis not present

## 2021-08-12 DIAGNOSIS — N186 End stage renal disease: Secondary | ICD-10-CM | POA: Diagnosis not present

## 2021-08-13 ENCOUNTER — Encounter (HOSPITAL_COMMUNITY): Payer: Self-pay | Admitting: Family Medicine

## 2021-08-13 ENCOUNTER — Emergency Department (HOSPITAL_COMMUNITY): Payer: No Typology Code available for payment source

## 2021-08-13 ENCOUNTER — Inpatient Hospital Stay (HOSPITAL_COMMUNITY)
Admission: EM | Admit: 2021-08-13 | Discharge: 2021-08-20 | DRG: 193 | Payer: No Typology Code available for payment source | Attending: Internal Medicine | Admitting: Internal Medicine

## 2021-08-13 DIAGNOSIS — M1990 Unspecified osteoarthritis, unspecified site: Secondary | ICD-10-CM | POA: Diagnosis present

## 2021-08-13 DIAGNOSIS — Z79890 Hormone replacement therapy (postmenopausal): Secondary | ICD-10-CM

## 2021-08-13 DIAGNOSIS — J189 Pneumonia, unspecified organism: Secondary | ICD-10-CM | POA: Diagnosis not present

## 2021-08-13 DIAGNOSIS — F419 Anxiety disorder, unspecified: Secondary | ICD-10-CM | POA: Diagnosis present

## 2021-08-13 DIAGNOSIS — J441 Chronic obstructive pulmonary disease with (acute) exacerbation: Secondary | ICD-10-CM | POA: Diagnosis not present

## 2021-08-13 DIAGNOSIS — Z887 Allergy status to serum and vaccine status: Secondary | ICD-10-CM

## 2021-08-13 DIAGNOSIS — K8689 Other specified diseases of pancreas: Secondary | ICD-10-CM | POA: Diagnosis present

## 2021-08-13 DIAGNOSIS — I252 Old myocardial infarction: Secondary | ICD-10-CM | POA: Diagnosis not present

## 2021-08-13 DIAGNOSIS — I10 Essential (primary) hypertension: Secondary | ICD-10-CM | POA: Diagnosis not present

## 2021-08-13 DIAGNOSIS — Z85528 Personal history of other malignant neoplasm of kidney: Secondary | ICD-10-CM

## 2021-08-13 DIAGNOSIS — J9601 Acute respiratory failure with hypoxia: Secondary | ICD-10-CM | POA: Diagnosis not present

## 2021-08-13 DIAGNOSIS — K219 Gastro-esophageal reflux disease without esophagitis: Secondary | ICD-10-CM | POA: Diagnosis present

## 2021-08-13 DIAGNOSIS — I2510 Atherosclerotic heart disease of native coronary artery without angina pectoris: Secondary | ICD-10-CM | POA: Diagnosis not present

## 2021-08-13 DIAGNOSIS — Z79899 Other long term (current) drug therapy: Secondary | ICD-10-CM

## 2021-08-13 DIAGNOSIS — N186 End stage renal disease: Secondary | ICD-10-CM

## 2021-08-13 DIAGNOSIS — I120 Hypertensive chronic kidney disease with stage 5 chronic kidney disease or end stage renal disease: Secondary | ICD-10-CM | POA: Diagnosis not present

## 2021-08-13 DIAGNOSIS — N400 Enlarged prostate without lower urinary tract symptoms: Secondary | ICD-10-CM | POA: Diagnosis present

## 2021-08-13 DIAGNOSIS — M898X9 Other specified disorders of bone, unspecified site: Secondary | ICD-10-CM | POA: Diagnosis present

## 2021-08-13 DIAGNOSIS — D631 Anemia in chronic kidney disease: Secondary | ICD-10-CM | POA: Diagnosis present

## 2021-08-13 DIAGNOSIS — Z9981 Dependence on supplemental oxygen: Secondary | ICD-10-CM | POA: Diagnosis not present

## 2021-08-13 DIAGNOSIS — J9621 Acute and chronic respiratory failure with hypoxia: Secondary | ICD-10-CM | POA: Diagnosis present

## 2021-08-13 DIAGNOSIS — N2581 Secondary hyperparathyroidism of renal origin: Secondary | ICD-10-CM | POA: Diagnosis present

## 2021-08-13 DIAGNOSIS — I480 Paroxysmal atrial fibrillation: Secondary | ICD-10-CM | POA: Diagnosis present

## 2021-08-13 DIAGNOSIS — R911 Solitary pulmonary nodule: Secondary | ICD-10-CM | POA: Diagnosis present

## 2021-08-13 DIAGNOSIS — I5033 Acute on chronic diastolic (congestive) heart failure: Secondary | ICD-10-CM | POA: Diagnosis present

## 2021-08-13 DIAGNOSIS — Z20822 Contact with and (suspected) exposure to covid-19: Secondary | ICD-10-CM | POA: Diagnosis present

## 2021-08-13 DIAGNOSIS — K56600 Partial intestinal obstruction, unspecified as to cause: Secondary | ICD-10-CM | POA: Diagnosis not present

## 2021-08-13 DIAGNOSIS — E039 Hypothyroidism, unspecified: Secondary | ICD-10-CM | POA: Diagnosis present

## 2021-08-13 DIAGNOSIS — I132 Hypertensive heart and chronic kidney disease with heart failure and with stage 5 chronic kidney disease, or end stage renal disease: Secondary | ICD-10-CM | POA: Diagnosis present

## 2021-08-13 DIAGNOSIS — Z8673 Personal history of transient ischemic attack (TIA), and cerebral infarction without residual deficits: Secondary | ICD-10-CM

## 2021-08-13 DIAGNOSIS — Z7982 Long term (current) use of aspirin: Secondary | ICD-10-CM

## 2021-08-13 DIAGNOSIS — F32A Depression, unspecified: Secondary | ICD-10-CM | POA: Diagnosis present

## 2021-08-13 DIAGNOSIS — Z7401 Bed confinement status: Secondary | ICD-10-CM | POA: Diagnosis not present

## 2021-08-13 DIAGNOSIS — J449 Chronic obstructive pulmonary disease, unspecified: Secondary | ICD-10-CM | POA: Diagnosis not present

## 2021-08-13 DIAGNOSIS — Y95 Nosocomial condition: Secondary | ICD-10-CM | POA: Diagnosis present

## 2021-08-13 DIAGNOSIS — L89103 Pressure ulcer of unspecified part of back, stage 3: Secondary | ICD-10-CM | POA: Diagnosis present

## 2021-08-13 DIAGNOSIS — K56609 Unspecified intestinal obstruction, unspecified as to partial versus complete obstruction: Secondary | ICD-10-CM

## 2021-08-13 DIAGNOSIS — M2550 Pain in unspecified joint: Secondary | ICD-10-CM | POA: Diagnosis not present

## 2021-08-13 DIAGNOSIS — Z955 Presence of coronary angioplasty implant and graft: Secondary | ICD-10-CM

## 2021-08-13 DIAGNOSIS — J440 Chronic obstructive pulmonary disease with acute lower respiratory infection: Secondary | ICD-10-CM | POA: Diagnosis present

## 2021-08-13 DIAGNOSIS — R269 Unspecified abnormalities of gait and mobility: Secondary | ICD-10-CM | POA: Diagnosis present

## 2021-08-13 DIAGNOSIS — Z87891 Personal history of nicotine dependence: Secondary | ICD-10-CM

## 2021-08-13 DIAGNOSIS — Z91030 Bee allergy status: Secondary | ICD-10-CM

## 2021-08-13 DIAGNOSIS — Z992 Dependence on renal dialysis: Secondary | ICD-10-CM

## 2021-08-13 DIAGNOSIS — D509 Iron deficiency anemia, unspecified: Secondary | ICD-10-CM | POA: Diagnosis present

## 2021-08-13 LAB — CBC WITH DIFFERENTIAL/PLATELET
Abs Immature Granulocytes: 0.03 10*3/uL (ref 0.00–0.07)
Basophils Absolute: 0.1 10*3/uL (ref 0.0–0.1)
Basophils Relative: 1 %
Eosinophils Absolute: 0.7 10*3/uL — ABNORMAL HIGH (ref 0.0–0.5)
Eosinophils Relative: 8 %
HCT: 41.1 % (ref 39.0–52.0)
Hemoglobin: 13.5 g/dL (ref 13.0–17.0)
Immature Granulocytes: 0 %
Lymphocytes Relative: 14 %
Lymphs Abs: 1.2 10*3/uL (ref 0.7–4.0)
MCH: 29.9 pg (ref 26.0–34.0)
MCHC: 32.8 g/dL (ref 30.0–36.0)
MCV: 90.9 fL (ref 80.0–100.0)
Monocytes Absolute: 1 10*3/uL (ref 0.1–1.0)
Monocytes Relative: 11 %
Neutro Abs: 5.8 10*3/uL (ref 1.7–7.7)
Neutrophils Relative %: 66 %
Platelets: 283 10*3/uL (ref 150–400)
RBC: 4.52 MIL/uL (ref 4.22–5.81)
RDW: 14.9 % (ref 11.5–15.5)
WBC: 8.8 10*3/uL (ref 4.0–10.5)
nRBC: 0 % (ref 0.0–0.2)

## 2021-08-13 LAB — COMPREHENSIVE METABOLIC PANEL
ALT: 13 U/L (ref 0–44)
AST: 16 U/L (ref 15–41)
Albumin: 3.2 g/dL — ABNORMAL LOW (ref 3.5–5.0)
Alkaline Phosphatase: 82 U/L (ref 38–126)
Anion gap: 14 (ref 5–15)
BUN: 20 mg/dL (ref 8–23)
CO2: 26 mmol/L (ref 22–32)
Calcium: 9.4 mg/dL (ref 8.9–10.3)
Chloride: 99 mmol/L (ref 98–111)
Creatinine, Ser: 3.37 mg/dL — ABNORMAL HIGH (ref 0.61–1.24)
GFR, Estimated: 17 mL/min — ABNORMAL LOW (ref >60)
Glucose, Bld: 96 mg/dL (ref 70–99)
Potassium: 4.1 mmol/L (ref 3.5–5.1)
Sodium: 139 mmol/L (ref 135–145)
Total Bilirubin: 0.7 mg/dL (ref 0.3–1.2)
Total Protein: 7.4 g/dL (ref 6.5–8.1)

## 2021-08-13 LAB — APTT: aPTT: 36 seconds (ref 24–36)

## 2021-08-13 LAB — PROTIME-INR
INR: 1.1 (ref 0.8–1.2)
Prothrombin Time: 14.2 seconds (ref 11.4–15.2)

## 2021-08-13 LAB — LACTIC ACID, PLASMA: Lactic Acid, Venous: 1.2 mmol/L (ref 0.5–1.9)

## 2021-08-13 MED ORDER — IPRATROPIUM-ALBUTEROL 0.5-2.5 (3) MG/3ML IN SOLN
3.0000 mL | Freq: Once | RESPIRATORY_TRACT | Status: AC
  Administered 2021-08-13: 3 mL via RESPIRATORY_TRACT
  Filled 2021-08-13: qty 3

## 2021-08-13 MED ORDER — SODIUM CHLORIDE 0.9 % IV SOLN
500.0000 mg | INTRAVENOUS | Status: DC
  Administered 2021-08-13: 500 mg via INTRAVENOUS
  Filled 2021-08-13: qty 5

## 2021-08-13 MED ORDER — PREDNISONE 20 MG PO TABS
40.0000 mg | ORAL_TABLET | Freq: Every day | ORAL | Status: DC
  Administered 2021-08-15 – 2021-08-16 (×2): 40 mg via ORAL
  Filled 2021-08-13 (×2): qty 2

## 2021-08-13 MED ORDER — SODIUM CHLORIDE 0.9 % IV SOLN
2.0000 g | INTRAVENOUS | Status: AC
  Administered 2021-08-14 – 2021-08-17 (×4): 2 g via INTRAVENOUS
  Filled 2021-08-13 (×4): qty 20

## 2021-08-13 MED ORDER — HEPARIN SODIUM (PORCINE) 5000 UNIT/ML IJ SOLN
5000.0000 [IU] | Freq: Three times a day (TID) | INTRAMUSCULAR | Status: DC
  Administered 2021-08-13 – 2021-08-20 (×20): 5000 [IU] via SUBCUTANEOUS
  Filled 2021-08-13 (×19): qty 1

## 2021-08-13 MED ORDER — IPRATROPIUM-ALBUTEROL 0.5-2.5 (3) MG/3ML IN SOLN
3.0000 mL | Freq: Three times a day (TID) | RESPIRATORY_TRACT | Status: DC
  Administered 2021-08-13 – 2021-08-14 (×2): 3 mL via RESPIRATORY_TRACT
  Filled 2021-08-13 (×3): qty 3

## 2021-08-13 MED ORDER — AZITHROMYCIN 500 MG PO TABS
500.0000 mg | ORAL_TABLET | Freq: Every day | ORAL | Status: DC
  Administered 2021-08-14 – 2021-08-16 (×3): 500 mg via ORAL
  Filled 2021-08-13 (×3): qty 1

## 2021-08-13 MED ORDER — ACETAMINOPHEN 325 MG PO TABS: 650.0000 mg | ORAL_TABLET | Freq: Four times a day (QID) | ORAL | Status: DC | PRN

## 2021-08-13 MED ORDER — SENNOSIDES-DOCUSATE SODIUM 8.6-50 MG PO TABS: 1.0000 | ORAL_TABLET | Freq: Every evening | ORAL | Status: DC | PRN

## 2021-08-13 MED ORDER — OXYCODONE HCL 5 MG PO TABS
5.0000 mg | ORAL_TABLET | ORAL | Status: DC | PRN
  Administered 2021-08-13 – 2021-08-16 (×9): 5 mg via ORAL
  Filled 2021-08-13 (×9): qty 1

## 2021-08-13 MED ORDER — METHYLPREDNISOLONE SODIUM SUCC 125 MG IJ SOLR
80.0000 mg | Freq: Two times a day (BID) | INTRAMUSCULAR | Status: AC
  Administered 2021-08-14 (×2): 80 mg via INTRAVENOUS
  Filled 2021-08-13 (×2): qty 2

## 2021-08-13 MED ORDER — GUAIFENESIN 100 MG/5ML PO LIQD: 5.0000 mL | ORAL | Status: DC | PRN

## 2021-08-13 MED ORDER — SODIUM CHLORIDE 0.9 % IV SOLN
1.0000 g | Freq: Once | INTRAVENOUS | Status: AC
  Administered 2021-08-13: 1 g via INTRAVENOUS
  Filled 2021-08-13: qty 10

## 2021-08-13 MED ORDER — HYDROMORPHONE HCL 1 MG/ML IJ SOLN
0.5000 mg | Freq: Once | INTRAMUSCULAR | Status: AC | PRN
  Administered 2021-08-16: 0.5 mg via INTRAVENOUS
  Filled 2021-08-13: qty 1

## 2021-08-13 MED ORDER — ALBUTEROL SULFATE (2.5 MG/3ML) 0.083% IN NEBU
2.5000 mg | INHALATION_SOLUTION | RESPIRATORY_TRACT | Status: DC | PRN
  Administered 2021-08-18 – 2021-08-20 (×2): 2.5 mg via RESPIRATORY_TRACT
  Filled 2021-08-13 (×2): qty 3

## 2021-08-13 NOTE — H&P (Signed)
History and Physical    ELVIS LAUFER HKV:[id number] DOB: [birth year] DOA: 08/13/2021  PCP: Rexene Agent, MD   Patient coming from: ALF   Chief Complaint: SOB, pleuritic pain   HPI: Wayne Meadows is a pleasant 87 y.o. male with medical history significant for history of renal cell carcinoma, emphysema, chronic hypoxic respiratory failure, CAD, hypertension, ESRD on dialysis, and documented history of paroxysmal atrial fibrillation not on anticoagulation, now presenting to the emergency department with shortness of breath, cough, and pleuritic pain.  He has been receiving treatment for pneumonia at his nursing facility, and has had persistent shortness of breath and cough, and then also developed severe pleuritic pain near his right scapula this morning.  He reports completing his dialysis session today but continued to be short of breath and was transported to the hospital for evaluation.  He was treated with Solu-Medrol and nebulized breathing treatments prior to arrival in the ED  ED Course: Upon arrival to the ED, patient is found to be afebrile and saturating in the upper 80s on his usual 3 L/min of supplemental oxygen with slightly elevated heart rate and stable blood pressure.  EKG features sinus tachycardia with rate 105.  Chest x-ray with mild pulmonary edema, right basilar streaky airspace opacity concerning for infection, and chronic emphysematous changes.  Chemistry panel notable for normal potassium, normal bicarbonate, normal BUN, and creatinine 3.37.  WBC is normal with eosinophilia and lactate is reassuringly normal.  Blood cultures collected and the patient was treated with Rocephin and azithromycin in the ED.  Review of Systems:  All other systems reviewed and apart from HPI, are negative.  Past Medical History:  Diagnosis Date   Abnormal echocardiogram 03/08/2021   Abnormal finding on GI tract imaging    Acute blood loss anemia    Acute pancreatitis 08/12/2020    Arthritis    Carotid stenosis    Community acquired pneumonia 04/06/2021   COPD (chronic obstructive pulmonary disease) (HCC)    Coronary artery disease    S/p PCI 2011;  NSTEMI 12/12:  LHC/PCI 02/23/11: LAD 60% after the septal perforator, D1 occluded with distal collaterals, proximal RI 30-40%, AV circumflex stent patent with 60% stenosis after the stent, RCA 99%, EF 60-65%.  His RCA was treated with a bare-metal stent   CVA (cerebral infarction) 2011   Right cerebral; total obstruction of the right ICA   Diverticulitis    Hypertension    Paroxysmal atrial fibrillation with RVR ([place]) 04/09/2021   Pleuritic chest pain 04/08/2014   Pressure injury of skin 03/30/2021   Rash 03/05/2021   Rectal bleeding 07/2015   Refractory nausea and vomiting 06/20/2020   Renal carcinoma ([place])    Sepsis, unspecified organism ([place]) 04/11/2016   Stroke ([place])    Tobacco abuse, in remission     Past Surgical History:  Procedure Laterality Date   AV FISTULA PLACEMENT Right 04/03/2021   Procedure: ARTERIOVENOUS (AV) CREATION OF RIGHT ARM BRACHIOCEPHALIC FISTULA;  Surgeon: Cherre Robins, MD;  Location: [place];  Service: Vascular;  Laterality: Right;   BIOPSY  04/08/2021   Procedure: BIOPSY;  Surgeon: Lavena Bullion, DO;  Location: [place];  Service: Gastroenterology;;   COLON SURGERY     COLONOSCOPY N/A 08/22/2015   Procedure: COLONOSCOPY;  Surgeon: [name], MD;  Location: [place] END[facility]COPY;  Service: Endoscopy;  Laterality: N/A;   ESOPHAGOGASTRODUODENOSCOPY N/A 07/18/2020   Procedure: ESOPHAGOGASTRODUODENOSCOPY (EGD);  Surgeon: Milus Banister, MD;  Location: [facility];  Service:  Endoscopy;  Laterality: N/A;   ESOPHAGOGASTRODUODENOSCOPY (EGD) WITH PROPOFOL N/A 06/21/2020   Procedure: ESOPHAGOGASTRODUODENOSCOPY (EGD) WITH PROPOFOL;  Surgeon: Irene Shipper, MD;  Location: [facility] ENDOSCOPY;  Service: Endoscopy;  Laterality: N/A;   ESOPHAGOGASTRODUODENOSCOPY (EGD) WITH PROPOFOL N/A 04/08/2021   Procedure:  ESOPHAGOGASTRODUODENOSCOPY (EGD) WITH PROPOFOL;  Surgeon: Lavena Bullion, DO;  Location: [place];  Service: Gastroenterology;  Laterality: N/A;   EUS N/A 07/18/2020   Procedure: UPPER ENDOSCOPIC ULTRASOUND (EUS) RADIAL;  Surgeon: Milus Banister, MD;  Location: [facility] ENDOSCOPY;  Service: Endoscopy;  Laterality: N/A;   FINE NEEDLE ASPIRATION N/A 07/18/2020   Procedure: FINE NEEDLE ASPIRATION (FNA) LINEAR;  Surgeon: Milus Banister, MD;  Location: [facility] ENDOSCOPY;  Service: Endoscopy;  Laterality: N/A;   HEMOSTASIS CLIP PLACEMENT  04/08/2021   Procedure: HEMOSTASIS CLIP PLACEMENT;  Surgeon: Lavena Bullion, DO;  Location: [place];  Service: Gastroenterology;;   hip relacement     IR FLUORO GUIDE CV LINE RIGHT  03/31/2021   IR US GUIDE VASC ACCESS RIGHT  03/31/2021   KIDNEY SURGERY     LEFT HEART CATH AND CORONARY ANGIOGRAPHY N/A 07/09/2020   Procedure: LEFT HEART CATH AND CORONARY ANGIOGRAPHY;  Surgeon: Leonie Man, MD;  Location: [place] CV LAB;  Service: Cardiovascular;  Laterality: N/A;   LEFT HEART CATHETERIZATION WITH CORONARY ANGIOGRAM N/A 02/23/2011   Procedure: LEFT HEART CATHETERIZATION WITH CORONARY ANGIOGRAM;  Surgeon: [name], MD;  Location: [facility] CATH LAB;  Service: Cardiovascular;  Laterality: N/A;   LEFT HEART CATHETERIZATION WITH CORONARY ANGIOGRAM N/A 03/18/2011   Procedure: LEFT HEART CATHETERIZATION WITH CORONARY ANGIOGRAM;  Surgeon: [name], MD;  Location: [facility] CATH LAB;  Service: Cardiovascular;  Laterality: N/A;   PERCUTANEOUS CORONARY STENT INTERVENTION (PCI-S) N/A 02/23/2011   Procedure: PERCUTANEOUS CORONARY STENT INTERVENTION (PCI-S);  Surgeon: [name], MD;  Location: [facility] CATH LAB;  Service: Cardiovascular;  Laterality: N/A;   TEMPORARY PACEMAKER INSERTION N/A 02/23/2011   Procedure: TEMPORARY PACEMAKER INSERTION;  Surgeon: [name], MD;  Location: [facility] CATH LAB;  Service: Cardiovascular;  Laterality: N/A;   THROMBECTOMY W/ EMBOLECTOMY  Right 04/03/2021   Procedure: THROMBECTOMY ARTERIOVENOUS FISTULA;  Surgeon: Cherre Robins, MD;  Location: [place];  Service: Vascular;  Laterality: Right;    Social History:   reports that he quit smoking about 14 years ago. His smoking use included cigarettes. He has never used smokeless tobacco. He reports that he does not currently use alcohol after a past usage of about 1.0 standard drink of alcohol per week. He reports that he does not use drugs.  Allergies  Allergen Reactions   Bee Venom Anaphylaxis    Has epi pen   Influenza Vaccines Other (See Comments)    "Mortally sick for 2 weeks"    Family History  Problem Relation Age of Onset   Heart attack Other 72     Prior to Admission medications   Medication Sig Start Date End Date Taking? Authorizing Provider  acetaminophen (TYLENOL) 325 MG tablet Take 2 tablets (650 mg total) by mouth every 6 (six) hours as needed for mild pain (or Fever >/= 101). 04/10/21   Bonnielee Haff, MD  albuterol (VENTOLIN HFA) 108 (90 Base) MCG/ACT inhaler Inhale 1 puff into the lungs every 6 (six) hours as needed for shortness of breath.    [provider]  aspirin EC 81 MG tablet Take 81 mg by mouth daily.    [provider]  camphor-menthol Timoteo Ace) lotion Apply topically  as needed for itching. 04/23/21   Alma Friendly, MD  cloNIDine (CATAPRES) 0.1 MG tablet Take 1 tablet (0.1 mg total) by mouth daily. 06/24/20   Harvie Heck, MD  diltiazem (CARDIZEM CD) 180 MG 24 hr capsule Take 1 capsule (180 mg total) by mouth daily. 04/11/21   Bonnielee Haff, MD  docusate sodium (COLACE) 100 MG capsule Take 1 capsule (100 mg total) by mouth every 12 (twelve) hours. 05/01/21   Sherwood Gambler, MD  ergocalciferol (VITAMIN D2) 1.25 MG (50000 UT) capsule Take 50,000 Units by mouth once a week.    [provider]  ferrous sulfate 325 (65 FE) MG tablet Take 1 tablet by mouth See admin instructions. Take one tablet by mouth Every Monday,  Wednesday, and Friday 07/30/20   [provider]  hydrALAZINE (APRESOLINE) 10 MG tablet Take 1 tablet (10 mg total) by mouth every 8 (eight) hours as needed (for SBP greater than 180). 04/04/21   Little Ishikawa, MD  HYDROcodone-acetaminophen (NORCO/VICODIN) 5-325 MG tablet Take 1 tablet by mouth every 6 (six) hours as needed for moderate pain or severe pain. 04/23/21   Alma Friendly, MD  Ipratropium-Albuterol (COMBIVENT) 20-100 MCG/ACT AERS respimat Inhale 1 puff into the lungs 4 (four) times daily.    [provider]  lanthanum (FOSRENOL) 1000 MG chewable tablet Chew 1 tablet (1,000 mg total) by mouth 3 (three) times daily with meals. 04/04/21   Little Ishikawa, MD  levothyroxine (SYNTHROID) 50 MCG tablet Take 50 mcg by mouth daily before breakfast.    [provider]  Multiple Vitamin (MULTIVITAMIN) tablet Take 1 tablet by mouth daily.    [provider]  naphazoline-glycerin (CLEAR EYES REDNESS) 0.012-0.25 % SOLN Place 1-2 drops into the right eye 3 (three) times daily. 04/23/21   Alma Friendly, MD  neomycin-bacitracin-polymyxin (NEOSPORIN) OINT Apply 1 application topically 3 (three) times daily. To right antecubital area 04/10/21   Bonnielee Haff, MD  Nutritional Supplements (,FEEDING SUPPLEMENT, PROSOURCE PLUS) liquid Take 30 mLs by mouth 2 (two) times daily between meals. 04/23/21   Alma Friendly, MD  Nutritional Supplements (FEEDING SUPPLEMENT, NEPRO CARB STEADY,) LIQD Take 237 mLs by mouth 3 (three) times daily before meals. 04/23/21   Alma Friendly, MD  ondansetron (ZOFRAN) 4 MG tablet Take 1 tablet (4 mg total) by mouth daily as needed for nausea or vomiting. 04/11/21 04/11/22  Bonnielee Haff, MD  pantoprazole (PROTONIX) 40 MG tablet Take 1 tablet (40 mg total) by mouth 2 (two) times daily. 04/10/21   Bonnielee Haff, MD  polyethylene glycol (MIRALAX / GLYCOLAX) 17 g packet Take 17 g by mouth daily. 05/01/21   Sherwood Gambler, MD   pravastatin (PRAVACHOL) 40 MG tablet Take 40 mg by mouth at bedtime.    [provider]  tamsulosin (FLOMAX) 0.4 MG CAPS capsule Take 1 capsule (0.4 mg total) by mouth daily. 04/11/21   Bonnielee Haff, MD    Physical Exam: Vitals:   08/13/21 2015 08/13/21 2045 08/13/21 2100 08/13/21 2115  BP: (!) 159/64 (!) 156/63 (!) 161/63 (!) 165/63  Pulse: (!) 105 (!) 105 (!) 104 (!) 108  Resp: '16 18 19 19  '$ Temp:      TempSrc:      SpO2: 96% 97% 97% 97%    Constitutional: NAD, calm  Eyes: PERTLA, lids and conjunctivae normal ENMT: Mucous membranes are moist. Posterior pharynx clear of any exudate or lesions.   Neck: supple, no masses  Respiratory: Diminished breath sounds bilaterally  with prolonged expiratory phase, wheezing, and coarse rales on right. No accessory muscle use.  Cardiovascular: S1 & S2 heard, regular rate and rhythm. Pretibial pitting edema. Abdomen: No distension, no tenderness, soft. Bowel sounds active.  Musculoskeletal: no clubbing / cyanosis. No joint deformity upper and lower extremities.   Skin: Erythema involving bilateral lower legs. Warm, dry, well-perfused. Neurologic: CN 2-12 grossly intact. Moving all extremities. Alert and oriented.  Psychiatric: Pleasant. Cooperative.    Labs and Imaging on Admission: I have personally reviewed following labs and imaging studies  CBC: Recent Labs  Lab 08/13/21 1758  WBC 8.8  NEUTROABS 5.8  HGB 13.5  HCT 41.1  MCV 90.9  PLT 967   Basic Metabolic Panel: Recent Labs  Lab 08/13/21 1758  NA 139  K 4.1  CL 99  CO2 26  GLUCOSE 96  BUN 20  CREATININE 3.37*  CALCIUM 9.4   GFR: CrCl cannot be calculated (Unknown ideal weight.). Liver Function Tests: Recent Labs  Lab 08/13/21 1758  AST 16  ALT 13  ALKPHOS 82  BILITOT 0.7  PROT 7.4  ALBUMIN 3.2*   No results for input(s): "LIPASE", "AMYLASE" in the last 168 hours. No results for input(s): "AMMONIA" in the last 168 hours. Coagulation  Profile: Recent Labs  Lab 08/13/21 1758  INR 1.1   Cardiac Enzymes: No results for input(s): "CKTOTAL", "CKMB", "CKMBINDEX", "TROPONINI" in the last 168 hours. BNP (last 3 results) No results for input(s): "PROBNP" in the last 8760 hours. HbA1C: No results for input(s): "HGBA1C" in the last 72 hours. CBG: No results for input(s): "GLUCAP" in the last 168 hours. Lipid Profile: No results for input(s): "CHOL", "HDL", "LDLCALC", "TRIG", "CHOLHDL", "LDLDIRECT" in the last 72 hours. Thyroid Function Tests: No results for input(s): "TSH", "T4TOTAL", "FREET4", "T3FREE", "THYROIDAB" in the last 72 hours. Anemia Panel: No results for input(s): "VITAMINB12", "FOLATE", "FERRITIN", "TIBC", "IRON", "RETICCTPCT" in the last 72 hours. Urine analysis:    Component Value Date/Time   COLORURINE YELLOW 05/01/2021 1446   APPEARANCEUR TURBID (A) 05/01/2021 1446   LABSPEC 1.025 05/01/2021 1446   PHURINE 5.0 05/01/2021 1446   GLUCOSEU NEGATIVE 05/01/2021 1446   HGBUR LARGE (A) 05/01/2021 1446   BILIRUBINUR NEGATIVE 05/01/2021 1446   KETONESUR 5 (A) 05/01/2021 1446   PROTEINUR >=300 (A) 05/01/2021 1446   NITRITE NEGATIVE 05/01/2021 1446   LEUKOCYTESUR MODERATE (A) 05/01/2021 1446   Sepsis Labs: '@LABRCNTIP'$ (procalcitonin:4,lacticidven:4) )No results found for this or any previous visit (from the past 240 hour(s)).   Radiological Exams on Admission: DG Chest Port 1 View  Result Date: 08/13/2021 CLINICAL DATA:  Questionable sepsis - evaluate for abnormality EXAM: PORTABLE CHEST 1 VIEW COMPARISON:  Chest x-ray 04/06/2021, CT chest 08/12/2020 FINDINGS: Right chest wall dialysis catheter with tip overlying the superior cavoatrial junction. The heart and mediastinal contours are unchanged. Aortic calcification. Prominent hilar regions. Right base streaky airspace opacity. No focal consolidation. Chronic coarsened markings with likely increased superimposed interstitial markings. No pleural effusion. No  pneumothorax. No acute osseous abnormality.  Old healed right rib fractures. IMPRESSION: 1. Mild pulmonary edema. 2. Right base streaky airspace opacity. Finding may represent a infection/inflammation. Followup PA and lateral chest X-ray is recommended in 3-4 weeks following therapy to ensure resolution and exclude underlying malignancy. 3. Aortic Atherosclerosis (ICD10-I70.0) and Emphysema (ICD10-J43.9). Electronically Signed   By: Iven Finn M.D.   On: 08/13/2021 18:25    EKG: Independently reviewed. Sinus tachycardia, rate 105.   Assessment/Plan   1. Pneumonia; acute COPD exacerbation; acute  on chronic hypoxic respiratory failure  - Presents with persistent cough and SOB despite outpatient treatment for PNA and full dialysis session [place], has also developed pleuritic pain on the right, and is found be in acute COPD exacerbation with likely pneumonia at right base on CXR  - Blood culture collected in ED and Rocephin and azithromycin started  - Check sputum culture, check strep pneumo and legionella antigens, start systemic steroids, continue Rocephin and azithromycin, schedule duonebs, use additional albuterol as needed, and trend procalcitonin    2. ESRD  - He reports completing a full dialysis session just pta in ED  - Restrict fluids, renally-dose medications     3. CAD  - No anginal complaints    4. Hypertension  - Pt unsure about his medications, plan to continue home regimen pending pharmacy medication reconciliation     DVT prophylaxis: sq heparin  Code Status: Full, discussed with patient in ED  Level of Care: Level of care: Med-Surg Family Communication: None present  Disposition Plan:  Patient is from: ALF  Anticipated d/c is to: TBD Anticipated d/c date is: 08/16/21  Patient currently: pending improved respiratory status  Consults called: none  Admission status: Inpatient     Vianne Bulls, MD [facility]  08/13/2021, 9:57 PM

## 2021-08-13 NOTE — ED Triage Notes (Signed)
Pt via [facility] from dialysis c/o SHOB, onset this AM. Resident of [facility], currently being tx for pneumonia; pt denies improvement in symptoms. Dialysis MWF, compliant. Hx COPD, on 3L [place] at baseline  Solumedrol & duoneb PTA via 22g L hand

## 2021-08-13 NOTE — ED Notes (Signed)
No rectal temp needed per EDP

## 2021-08-13 NOTE — ED Provider Notes (Signed)
[facility] EMERGENCY DEPARTMENT Provider Note   CSN: [id number] Arrival date & time: 08/13/21  1742     History Chief Complaint  Patient presents with   Shortness of Breath    Wayne Meadows is a 87 y.o. male with history of CKD on dialysis, A-fib, CVA, hypertension presents the emergency department for evaluation of shortness of breath.  Patient reports has been feeling short of breath for the past few days which was he diagnosed with pneumonia. He reports he was not feeling any better and received multiple breathing treatments for dialysis today.  He got the dialysis off and felt better although he still felt short of breath and decided to come to the emergency department.  He is usually on 3 L nasal cannula at baseline.  He was originally satting 88% on 3 L upon initial presentation.  He reports some upper mid back pain.  Denies any chest pain, fevers, runny nose, nasal congestion, abdominal pain, nausea, or vomiting.  Shortness of Breath Associated symptoms: cough   Associated symptoms: no abdominal pain, no chest pain, no fever and no vomiting        Home Medications Prior to Admission medications   Medication Sig Start Date End Date Taking? Authorizing Provider  acetaminophen (TYLENOL) 325 MG tablet Take 2 tablets (650 mg total) by mouth every 6 (six) hours as needed for mild pain (or Fever >/= 101). 04/10/21   Bonnielee Haff, MD  albuterol (VENTOLIN HFA) 108 (90 Base) MCG/ACT inhaler Inhale 1 puff into the lungs every 6 (six) hours as needed for shortness of breath.    [provider]  aspirin EC 81 MG tablet Take 81 mg by mouth daily.    [provider]  camphor-menthol Timoteo Ace) lotion Apply topically as needed for itching. 04/23/21   Alma Friendly, MD  cloNIDine (CATAPRES) 0.1 MG tablet Take 1 tablet (0.1 mg total) by mouth daily. 06/24/20   Harvie Heck, MD  diltiazem (CARDIZEM CD) 180 MG 24 hr capsule Take 1 capsule (180 mg total) by  mouth daily. 04/11/21   Bonnielee Haff, MD  docusate sodium (COLACE) 100 MG capsule Take 1 capsule (100 mg total) by mouth every 12 (twelve) hours. 05/01/21   Sherwood Gambler, MD  ergocalciferol (VITAMIN D2) 1.25 MG (50000 UT) capsule Take 50,000 Units by mouth once a week.    [provider]  ferrous sulfate 325 (65 FE) MG tablet Take 1 tablet by mouth See admin instructions. Take one tablet by mouth Every Monday, Wednesday, and Friday 07/30/20   [provider]  hydrALAZINE (APRESOLINE) 10 MG tablet Take 1 tablet (10 mg total) by mouth every 8 (eight) hours as needed (for SBP greater than 180). 04/04/21   Little Ishikawa, MD  HYDROcodone-acetaminophen (NORCO/VICODIN) 5-325 MG tablet Take 1 tablet by mouth every 6 (six) hours as needed for moderate pain or severe pain. 04/23/21   Alma Friendly, MD  Ipratropium-Albuterol (COMBIVENT) 20-100 MCG/ACT AERS respimat Inhale 1 puff into the lungs 4 (four) times daily.    [provider]  lanthanum (FOSRENOL) 1000 MG chewable tablet Chew 1 tablet (1,000 mg total) by mouth 3 (three) times daily with meals. 04/04/21   Little Ishikawa, MD  levothyroxine (SYNTHROID) 50 MCG tablet Take 50 mcg by mouth daily before breakfast.    [provider]  Multiple Vitamin (MULTIVITAMIN) tablet Take 1 tablet by mouth daily.    [provider]  naphazoline-glycerin (CLEAR EYES REDNESS) 0.012-0.25 % SOLN  Place 1-2 drops into the right eye 3 (three) times daily. 04/23/21   Alma Friendly, MD  neomycin-bacitracin-polymyxin (NEOSPORIN) OINT Apply 1 application topically 3 (three) times daily. To right antecubital area 04/10/21   Bonnielee Haff, MD  Nutritional Supplements (,FEEDING SUPPLEMENT, PROSOURCE PLUS) liquid Take 30 mLs by mouth 2 (two) times daily between meals. 04/23/21   Alma Friendly, MD  Nutritional Supplements (FEEDING SUPPLEMENT, NEPRO CARB STEADY,) LIQD Take 237 mLs by mouth 3 (three) times daily before  meals. 04/23/21   Alma Friendly, MD  ondansetron (ZOFRAN) 4 MG tablet Take 1 tablet (4 mg total) by mouth daily as needed for nausea or vomiting. 04/11/21 04/11/22  Bonnielee Haff, MD  pantoprazole (PROTONIX) 40 MG tablet Take 1 tablet (40 mg total) by mouth 2 (two) times daily. 04/10/21   Bonnielee Haff, MD  polyethylene glycol (MIRALAX / GLYCOLAX) 17 g packet Take 17 g by mouth daily. 05/01/21   Sherwood Gambler, MD  pravastatin (PRAVACHOL) 40 MG tablet Take 40 mg by mouth at bedtime.    [provider]  tamsulosin (FLOMAX) 0.4 MG CAPS capsule Take 1 capsule (0.4 mg total) by mouth daily. 04/11/21   Bonnielee Haff, MD      Allergies    Bee venom and Influenza vaccines    Review of Systems   Review of Systems  Constitutional:  Negative for chills and fever.  HENT:  Negative for congestion and rhinorrhea.   Respiratory:  Positive for cough and shortness of breath.   Cardiovascular:  Negative for chest pain and palpitations.  Gastrointestinal:  Negative for abdominal pain, constipation, diarrhea, nausea and vomiting.  Genitourinary:  Negative for dysuria and hematuria.  Musculoskeletal:  Positive for back pain.    Physical Exam Updated Vital Signs BP (!) 154/65   Pulse 100   Temp 99.1 F (37.3 C) (Oral)   Resp 17   SpO2 98%  Physical Exam Vitals and nursing note reviewed.  Constitutional:      Appearance: Normal appearance.     Comments: Frail, cachetic  Eyes:     General: No scleral icterus. Cardiovascular:     Rate and Rhythm: Tachycardia present.  Pulmonary:     Comments: Patient is slightly tachypenic, mild belly breathing.  He is speaking in full sentences yet needing to take a break between sentences.  Respiratory rate around 24.  Nasal cannula in place.  Patient has coarse rhonchorous sounds bilaterally, however right greater than left. Musculoskeletal:     Right lower leg: No tenderness. No edema.     Left lower leg: No tenderness. No edema.  Skin:     General: Skin is warm and dry.     Comments: Patient has erythema surrounding his dialysis catheter in his right chest.  Patient reports he is allergic to the tape.  Neurological:     General: No focal deficit present.     Mental Status: He is alert. Mental status is at baseline.  Psychiatric:        Mood and Affect: Mood normal.     ED Results / Procedures / Treatments   Labs (all labs ordered are listed, but only abnormal results are displayed) Labs Reviewed  COMPREHENSIVE METABOLIC PANEL - Abnormal; Notable for the following components:      Result Value   Creatinine, Ser 3.37 (*)    Albumin 3.2 (*)    GFR, Estimated 17 (*)    All other components within normal limits  CBC WITH DIFFERENTIAL/PLATELET - Abnormal;  Notable for the following components:   Eosinophils Absolute 0.7 (*)    All other components within normal limits  CULTURE, BLOOD (ROUTINE X 2)  CULTURE, BLOOD (ROUTINE X 2)  URINE CULTURE  LACTIC ACID, PLASMA  PROTIME-INR  APTT  URINALYSIS, ROUTINE W REFLEX MICROSCOPIC    EKG EKG Interpretation  Date/Time:  Wednesday August 13 2021 17:50:40 EDT Ventricular Rate:  105 PR Interval:  171 QRS Duration: 99 QT Interval:  367 QTC Calculation: 485 R Axis:   88 Text Interpretation: Sinus tachycardia Prominent P waves, nondiagnostic Anterior infarct, old Minimal ST depression, inferior leads Confirmed by Davonna Belling [phone]) on 08/13/2021 7:17:00 PM  Radiology DG Chest Port 1 View  Result Date: 08/13/2021 CLINICAL DATA:  Questionable sepsis - evaluate for abnormality EXAM: PORTABLE CHEST 1 VIEW COMPARISON:  Chest x-ray 04/06/2021, CT chest 08/12/2020 FINDINGS: Right chest wall dialysis catheter with tip overlying the superior cavoatrial junction. The heart and mediastinal contours are unchanged. Aortic calcification. Prominent hilar regions. Right base streaky airspace opacity. No focal consolidation. Chronic coarsened markings with likely increased superimposed  interstitial markings. No pleural effusion. No pneumothorax. No acute osseous abnormality.  Old healed right rib fractures. IMPRESSION: 1. Mild pulmonary edema. 2. Right base streaky airspace opacity. Finding may represent a infection/inflammation. Followup PA and lateral chest X-ray is recommended in 3-4 weeks following therapy to ensure resolution and exclude underlying malignancy. 3. Aortic Atherosclerosis (ICD10-I70.0) and Emphysema (ICD10-J43.9). Electronically Signed   By: Iven Finn M.D.   On: 08/13/2021 18:25    Procedures Procedures   Medications Ordered in ED Medications  ipratropium-albuterol (DUONEB) 0.5-2.5 (3) MG/3ML nebulizer solution 3 mL (3 mLs Nebulization Given 08/13/21 1818)    ED Course/ Medical Decision Making/ A&P                           Medical Decision Making Amount and/or Complexity of Data Reviewed Labs: ordered. Radiology: ordered. ECG/medicine tests: ordered.  Risk Prescription drug management. Decision regarding hospitalization.   87 year old male presents the emergency department for evaluation of shortness of breath.  Differential diagnosis includes but is not limited to COPD exacerbation, pneumonia, pneumothorax, ACS, sepsis.  Vital signs show mildly elevated blood pressure 165/63, afebrile, elevated pulse rate in the low 100s although this is normal for patient looking at previous notes.  Initially was satting 88% on his at home 3 L.  Patient was upgraded to 6 L and resulted in 97%.  Physical exam is pertinent for a frail, cachectic. Patient is slightly tachypenic, mild belly breathing.  He is speaking in full sentences yet needing to take a break between sentences.  Respiratory rate around 24.  Nasal cannula in place.  Patient has coarse rhonchorous sounds bilaterally, however right greater than left.  We will order evolving sepsis labs.  I independently reviewed and interpreted the patient's labs.  CBC shows no leukocytosis or anemia.  Patient has  slightly elevated absolute eosinophil count.  CMP shows creatinine at 3.37 although patient known dialysis patient just finished session today.  Decreased albumin at 3.2 otherwise no electrolyte or LFT abnormality.  Normal PTT and PT/INR.  Lactic acid normal.    CXR shows: 1. Mild pulmonary edema. 2. Right base streaky airspace opacity. Finding may represent a infection/inflammation. Followup PA and lateral chest X-ray is recommended in 3-4 weeks following therapy to ensure resolution and exclude underlying malignancy. 3. Aortic Atherosclerosis   Patient does not meet sepsis criteria.  The patient is  not hypotensive and has a normal lactic acid.  No fluids were given.  Although likely source is from his pneumonia, did order Rocephin and azithromycin.  Ordered DuoNeb.    On reevaluation, patient is satting better and has decreased work of breathing.  Given the patient has failed outpatient treatment, will admit to medicine.  Dr. Myna Hidalgo to admit.  I discussed this case with my attending physician who cosigned this note including patient's presenting symptoms, physical exam, and planned diagnostics and interventions. Attending physician stated agreement with plan or made changes to plan which were implemented.   Final Clinical Impression(s) / ED Diagnoses Final diagnoses:  Community acquired pneumonia of right lower lobe of lung    Rx / DC Orders ED Discharge Orders     None         Sherrell Puller, Hershal Coria 08/13/21 2144    Davonna Belling, MD 08/13/21 [phone]

## 2021-08-13 NOTE — ED Notes (Signed)
Pt O2 88% on 3L; increased to 6L [place], O2 sats 93%

## 2021-08-14 ENCOUNTER — Inpatient Hospital Stay (HOSPITAL_COMMUNITY): Payer: No Typology Code available for payment source

## 2021-08-14 ENCOUNTER — Other Ambulatory Visit: Payer: Self-pay

## 2021-08-14 DIAGNOSIS — J189 Pneumonia, unspecified organism: Secondary | ICD-10-CM | POA: Diagnosis not present

## 2021-08-14 DIAGNOSIS — J9621 Acute and chronic respiratory failure with hypoxia: Secondary | ICD-10-CM | POA: Diagnosis not present

## 2021-08-14 DIAGNOSIS — J441 Chronic obstructive pulmonary disease with (acute) exacerbation: Secondary | ICD-10-CM | POA: Diagnosis not present

## 2021-08-14 DIAGNOSIS — I10 Essential (primary) hypertension: Secondary | ICD-10-CM

## 2021-08-14 DIAGNOSIS — I2510 Atherosclerotic heart disease of native coronary artery without angina pectoris: Secondary | ICD-10-CM

## 2021-08-14 DIAGNOSIS — Z992 Dependence on renal dialysis: Secondary | ICD-10-CM

## 2021-08-14 DIAGNOSIS — N186 End stage renal disease: Secondary | ICD-10-CM

## 2021-08-14 LAB — CBC
HCT: 40.5 % (ref 39.0–52.0)
Hemoglobin: 13 g/dL (ref 13.0–17.0)
MCH: 29.7 pg (ref 26.0–34.0)
MCHC: 32.1 g/dL (ref 30.0–36.0)
MCV: 92.7 fL (ref 80.0–100.0)
Platelets: 300 10*3/uL (ref 150–400)
RBC: 4.37 MIL/uL (ref 4.22–5.81)
RDW: 14.9 % (ref 11.5–15.5)
WBC: 5.7 10*3/uL (ref 4.0–10.5)
nRBC: 0 % (ref 0.0–0.2)

## 2021-08-14 LAB — BASIC METABOLIC PANEL
Anion gap: 23 — ABNORMAL HIGH (ref 5–15)
BUN: 33 mg/dL — ABNORMAL HIGH (ref 8–23)
CO2: 19 mmol/L — ABNORMAL LOW (ref 22–32)
Calcium: 9.2 mg/dL (ref 8.9–10.3)
Chloride: 96 mmol/L — ABNORMAL LOW (ref 98–111)
Creatinine, Ser: 4.59 mg/dL — ABNORMAL HIGH (ref 0.61–1.24)
GFR, Estimated: 12 mL/min — ABNORMAL LOW (ref >60)
Glucose, Bld: 156 mg/dL — ABNORMAL HIGH (ref 70–99)
Potassium: 4.9 mmol/L (ref 3.5–5.1)
Sodium: 138 mmol/L (ref 135–145)

## 2021-08-14 LAB — HEPATITIS B SURFACE ANTIBODY,QUALITATIVE: Hep B S Ab: NONREACTIVE

## 2021-08-14 LAB — PROCALCITONIN: Procalcitonin: 0.23 ng/mL

## 2021-08-14 LAB — SARS CORONAVIRUS 2 BY RT PCR: SARS Coronavirus 2 by RT PCR: NEGATIVE

## 2021-08-14 LAB — HEPATITIS C ANTIBODY: HCV Ab: NONREACTIVE

## 2021-08-14 LAB — HEPATITIS B CORE ANTIBODY, TOTAL: Hep B Core Total Ab: NONREACTIVE

## 2021-08-14 LAB — HEPATITIS B SURFACE ANTIGEN: Hepatitis B Surface Ag: NONREACTIVE

## 2021-08-14 LAB — D-DIMER, QUANTITATIVE: D-Dimer, Quant: 2.01 ug/mL-FEU — ABNORMAL HIGH (ref 0.00–0.50)

## 2021-08-14 MED ORDER — MELATONIN 5 MG PO TABS
5.0000 mg | ORAL_TABLET | Freq: Every day | ORAL | Status: DC
  Administered 2021-08-14 – 2021-08-16 (×3): 5 mg via ORAL
  Filled 2021-08-14 (×3): qty 1

## 2021-08-14 MED ORDER — LIDOCAINE HCL (PF) 1 % IJ SOLN: 5.0000 mL | INTRAMUSCULAR | Status: DC | PRN

## 2021-08-14 MED ORDER — ALTEPLASE 2 MG IJ SOLR: 2.0000 mg | Freq: Once | INTRAMUSCULAR | Status: DC | PRN

## 2021-08-14 MED ORDER — TAMSULOSIN HCL 0.4 MG PO CAPS
0.4000 mg | ORAL_CAPSULE | Freq: Every day | ORAL | Status: DC
  Administered 2021-08-14 – 2021-08-17 (×4): 0.4 mg via ORAL
  Filled 2021-08-14 (×4): qty 1

## 2021-08-14 MED ORDER — ANTICOAGULANT SODIUM CITRATE 4% (200MG/5ML) IV SOLN
5.0000 mL | Status: DC | PRN
  Filled 2021-08-14: qty 5

## 2021-08-14 MED ORDER — LIDOCAINE-PRILOCAINE 2.5-2.5 % EX CREA
1.0000 | TOPICAL_CREAM | CUTANEOUS | Status: DC | PRN
  Filled 2021-08-14: qty 5

## 2021-08-14 MED ORDER — BUSPIRONE HCL 5 MG PO TABS
5.0000 mg | ORAL_TABLET | Freq: Two times a day (BID) | ORAL | Status: DC
Start: 2021-08-14 — End: 2021-08-18
  Administered 2021-08-14 – 2021-08-17 (×7): 5 mg via ORAL
  Filled 2021-08-14 (×7): qty 1

## 2021-08-14 MED ORDER — PANTOPRAZOLE SODIUM 40 MG PO TBEC
40.0000 mg | DELAYED_RELEASE_TABLET | Freq: Two times a day (BID) | ORAL | Status: DC
  Administered 2021-08-14 – 2021-08-16 (×6): 40 mg via ORAL
  Filled 2021-08-14 (×6): qty 1

## 2021-08-14 MED ORDER — HEPARIN SODIUM (PORCINE) 1000 UNIT/ML DIALYSIS
3000.0000 [IU] | Freq: Once | INTRAMUSCULAR | Status: DC
  Filled 2021-08-14: qty 3

## 2021-08-14 MED ORDER — ASPIRIN 81 MG PO TBEC
81.0000 mg | DELAYED_RELEASE_TABLET | Freq: Every day | ORAL | Status: DC
  Administered 2021-08-14 – 2021-08-17 (×4): 81 mg via ORAL
  Filled 2021-08-14 (×4): qty 1

## 2021-08-14 MED ORDER — CHLORHEXIDINE GLUCONATE CLOTH 2 % EX PADS
6.0000 | MEDICATED_PAD | Freq: Every day | CUTANEOUS | Status: DC
Start: 2021-08-15 — End: 2021-08-17
  Administered 2021-08-15 – 2021-08-16 (×2): 6 via TOPICAL

## 2021-08-14 MED ORDER — IPRATROPIUM-ALBUTEROL 0.5-2.5 (3) MG/3ML IN SOLN
3.0000 mL | Freq: Three times a day (TID) | RESPIRATORY_TRACT | Status: DC
  Administered 2021-08-14 – 2021-08-16 (×4): 3 mL via RESPIRATORY_TRACT
  Filled 2021-08-14 (×3): qty 3

## 2021-08-14 MED ORDER — DILTIAZEM HCL ER COATED BEADS 180 MG PO CP24
180.0000 mg | ORAL_CAPSULE | Freq: Every day | ORAL | Status: DC
Start: 2021-08-14 — End: 2021-08-18
  Administered 2021-08-14 – 2021-08-17 (×4): 180 mg via ORAL
  Filled 2021-08-14 (×4): qty 1

## 2021-08-14 MED ORDER — DOCUSATE SODIUM 100 MG PO CAPS
100.0000 mg | ORAL_CAPSULE | Freq: Two times a day (BID) | ORAL | Status: DC
Start: 2021-08-14 — End: 2021-08-17
  Administered 2021-08-14 – 2021-08-16 (×5): 100 mg via ORAL
  Filled 2021-08-14 (×6): qty 1

## 2021-08-14 MED ORDER — HYDROXYZINE HCL 10 MG PO TABS
10.0000 mg | ORAL_TABLET | Freq: Three times a day (TID) | ORAL | Status: DC | PRN
  Administered 2021-08-14 – 2021-08-16 (×5): 10 mg via ORAL
  Filled 2021-08-14 (×6): qty 1

## 2021-08-14 MED ORDER — HEPARIN SODIUM (PORCINE) 1000 UNIT/ML DIALYSIS
3000.0000 [IU] | Freq: Once | INTRAMUSCULAR | Status: AC
  Administered 2021-08-14: 3000 [IU] via INTRAVENOUS_CENTRAL
  Filled 2021-08-14: qty 3

## 2021-08-14 MED ORDER — LIDOCAINE-PRILOCAINE 2.5-2.5 % EX CREA: 1.0000 | TOPICAL_CREAM | CUTANEOUS | Status: DC | PRN

## 2021-08-14 MED ORDER — POLYETHYLENE GLYCOL 3350 17 G PO PACK
17.0000 g | PACK | Freq: Every day | ORAL | Status: DC | PRN
  Administered 2021-08-16: 17 g via ORAL
  Filled 2021-08-14: qty 1

## 2021-08-14 MED ORDER — FERROUS SULFATE 325 (65 FE) MG PO TABS
325.0000 mg | ORAL_TABLET | ORAL | Status: DC
Start: 2021-08-15 — End: 2021-08-15

## 2021-08-14 MED ORDER — PRAVASTATIN SODIUM 40 MG PO TABS
40.0000 mg | ORAL_TABLET | Freq: Every day | ORAL | Status: DC
  Administered 2021-08-14 – 2021-08-16 (×3): 40 mg via ORAL
  Filled 2021-08-14 (×3): qty 1

## 2021-08-14 MED ORDER — LEVOTHYROXINE SODIUM 50 MCG PO TABS
50.0000 ug | ORAL_TABLET | Freq: Every day | ORAL | Status: DC
  Administered 2021-08-14 – 2021-08-16 (×3): 50 ug via ORAL
  Filled 2021-08-14: qty 1
  Filled 2021-08-14: qty 2
  Filled 2021-08-14: qty 1

## 2021-08-14 MED ORDER — PENTAFLUOROPROP-TETRAFLUOROETH EX AERO
1.0000 | INHALATION_SPRAY | CUTANEOUS | Status: DC | PRN
  Filled 2021-08-14: qty 116

## 2021-08-14 MED ORDER — PENTAFLUOROPROP-TETRAFLUOROETH EX AERO: 1.0000 | INHALATION_SPRAY | CUTANEOUS | Status: DC | PRN

## 2021-08-14 MED ORDER — CLONIDINE HCL 0.1 MG PO TABS
0.1000 mg | ORAL_TABLET | Freq: Every day | ORAL | Status: DC
  Administered 2021-08-14 – 2021-08-17 (×4): 0.1 mg via ORAL
  Filled 2021-08-14 (×4): qty 1

## 2021-08-14 MED ORDER — HEPARIN SODIUM (PORCINE) 1000 UNIT/ML DIALYSIS: 1000.0000 [IU] | INTRAMUSCULAR | Status: DC | PRN

## 2021-08-14 NOTE — Progress Notes (Signed)
Received patient in bed, alert and oriented. Informed consent signed and in chart.  Time tx initiated: 1430  Pre HD weight:  75.8 KG  Pre HD VS:  164/69 BP 114 HR 91% 21 Resp 98.3 T  Time tx completed: 3795  HD treatment completed. Patient tolerated well. Right AFT without signs and symptoms of complications. Patient transported back to the room, alert and orient and in no acute distress. Patient declined snack when offered. Report given to bedside RN.  Total UF removed: 4L  Medication given: N/A  Post HD VS: 144/62 BP 104 HR 93% 18 R 98.2 T   Post HD weight: 71.5 KG

## 2021-08-14 NOTE — Final Consult Note (Addendum)
[facility] Renal Consultation Note    Indication for Consultation:  Management of ESRD/hemodialysis; anemia, hypertension/volume and secondary hyperparathyroidism PCP: None on file Nephrologist: Dr. Joelyn Oms  HPI: Wayne Meadows is a 87 y.o. male with ESRD on hemodialysis MWF at [facility]. PMH: HTN, CAD, CVA, PAF not on AC, COPD, Anemia, SHPT. Patient was brought to ED from [facility] 08/14/2021 with C/O SOB, cough, pleurisy. T-99.1 WBC 8.8  HGB 13.5 PLT 283 NA 139 K+ 4.1 CO2 26 SCr 3.37 BUN 20. CXR with mild pulmonary edema, right base streaky airspace opacity. He has been admitted for CAP/acute respiratory failure. BC pending. He has been started on ABX per primary.  He has not missed HD but not getting to OP EDW.  Seen in ED. He appears acutely ill. HR 110, BP 1262/68. He is complaining of pain between his shoulder blades 8/10. Discussed with primary. He apparently has had this type of pain earlier. Does have wheezing present but also trace-1+ pedal edema, few bibasilar crackles. Erythema around [facility], severe itching around site. Will order urgent HD today off schedule. Will run sequential for volume removal.   Past Medical History:  Diagnosis Date   Abnormal echocardiogram 03/08/2021   Abnormal finding on GI tract imaging    Acute blood loss anemia    Acute pancreatitis 08/12/2020   Arthritis    Carotid stenosis    Community acquired pneumonia 04/06/2021   COPD (chronic obstructive pulmonary disease) (HCC)    Coronary artery disease    S/p PCI 2011;  NSTEMI 12/12:  LHC/PCI 02/23/11: LAD 60% after the septal perforator, D1 occluded with distal collaterals, proximal RI 30-40%, AV circumflex stent patent with 60% stenosis after the stent, RCA 99%, EF 60-65%.  His RCA was treated with a bare-metal stent   CVA (cerebral infarction) 2011   Right cerebral; total obstruction of the right ICA   Diverticulitis    Hypertension    Paroxysmal atrial fibrillation with RVR ([place])  04/09/2021   Pleuritic chest pain 04/08/2014   Pressure injury of skin 03/30/2021   Rash 03/05/2021   Rectal bleeding 07/2015   Refractory nausea and vomiting 06/20/2020   Renal carcinoma ([place])    Sepsis, unspecified organism ([place]) 04/11/2016   Stroke ([place])    Tobacco abuse, in remission    Past Surgical History:  Procedure Laterality Date   AV FISTULA PLACEMENT Right 04/03/2021   Procedure: ARTERIOVENOUS (AV) CREATION OF RIGHT ARM BRACHIOCEPHALIC FISTULA;  Surgeon: Cherre Robins, MD;  Location: [place];  Service: Vascular;  Laterality: Right;   BIOPSY  04/08/2021   Procedure: BIOPSY;  Surgeon: Lavena Bullion, DO;  Location: [place];  Service: Gastroenterology;;   COLON SURGERY     COLONOSCOPY N/A 08/22/2015   Procedure: COLONOSCOPY;  Surgeon: [name], MD;  Location: [place] [facility];  Service: Endoscopy;  Laterality: N/A;   ESOPHAGOGASTRODUODENOSCOPY N/A 07/18/2020   Procedure: ESOPHAGOGASTRODUODENOSCOPY (EGD);  Surgeon: Milus Banister, MD;  Location: [facility];  Service: Endoscopy;  Laterality: N/A;   ESOPHAGOGASTRODUODENOSCOPY (EGD) WITH PROPOFOL N/A 06/21/2020   Procedure: ESOPHAGOGASTRODUODENOSCOPY (EGD) WITH PROPOFOL;  Surgeon: Irene Shipper, MD;  Location: [facility] ENDOSCOPY;  Service: Endoscopy;  Laterality: N/A;   ESOPHAGOGASTRODUODENOSCOPY (EGD) WITH PROPOFOL N/A 04/08/2021   Procedure: ESOPHAGOGASTRODUODENOSCOPY (EGD) WITH PROPOFOL;  Surgeon: Lavena Bullion, DO;  Location: [place];  Service: Gastroenterology;  Laterality: N/A;   EUS N/A 07/18/2020   Procedure: UPPER ENDOSCOPIC ULTRASOUND (EUS) RADIAL;  Surgeon: Milus Banister, MD;  Location:  [facility];  Service: Endoscopy;  Laterality: N/A;   FINE NEEDLE ASPIRATION N/A 07/18/2020   Procedure: FINE NEEDLE ASPIRATION (FNA) LINEAR;  Surgeon: Milus Banister, MD;  Location: [facility];  Service: Endoscopy;  Laterality: N/A;   HEMOSTASIS CLIP PLACEMENT  04/08/2021   Procedure: HEMOSTASIS CLIP PLACEMENT;  Surgeon:  Lavena Bullion, DO;  Location: [place];  Service: Gastroenterology;;   hip relacement     IR FLUORO GUIDE CV LINE RIGHT  03/31/2021   IR US GUIDE VASC ACCESS RIGHT  03/31/2021   KIDNEY SURGERY     LEFT HEART CATH AND CORONARY ANGIOGRAPHY N/A 07/09/2020   Procedure: LEFT HEART CATH AND CORONARY ANGIOGRAPHY;  Surgeon: Leonie Man, MD;  Location: [place] CV [facility];  Service: Cardiovascular;  Laterality: N/A;   LEFT HEART CATHETERIZATION WITH CORONARY ANGIOGRAM N/A 02/23/2011   Procedure: LEFT HEART CATHETERIZATION WITH CORONARY ANGIOGRAM;  Surgeon: [name], MD;  Location: [facility] CATH LAB;  Service: Cardiovascular;  Laterality: N/A;   LEFT HEART CATHETERIZATION WITH CORONARY ANGIOGRAM N/A 03/18/2011   Procedure: LEFT HEART CATHETERIZATION WITH CORONARY ANGIOGRAM;  Surgeon: [name], MD;  Location: [facility] CATH LAB;  Service: Cardiovascular;  Laterality: N/A;   PERCUTANEOUS CORONARY STENT INTERVENTION (PCI-S) N/A 02/23/2011   Procedure: PERCUTANEOUS CORONARY STENT INTERVENTION (PCI-S);  Surgeon: [name], MD;  Location: [facility] CATH LAB;  Service: Cardiovascular;  Laterality: N/A;   TEMPORARY PACEMAKER INSERTION N/A 02/23/2011   Procedure: TEMPORARY PACEMAKER INSERTION;  Surgeon: [name], MD;  Location: [facility] CATH LAB;  Service: Cardiovascular;  Laterality: N/A;   THROMBECTOMY W/ EMBOLECTOMY Right 04/03/2021   Procedure: THROMBECTOMY ARTERIOVENOUS FISTULA;  Surgeon: Cherre Robins, MD;  Location: [facility] OR;  Service: Vascular;  Laterality: Right;   Family History  Problem Relation Age of Onset   Heart attack Other 53   Social History:  reports that he quit smoking about 14 years ago. His smoking use included cigarettes. He has never used smokeless tobacco. He reports that he does not currently use alcohol after a past usage of about 1.0 standard drink of alcohol per week. He reports that he does not use drugs. Allergies  Allergen Reactions   Bee Venom Anaphylaxis    Has epi pen    Influenza Vaccines Other (See Comments)    "Mortally sick for 2 weeks"   Prior to Admission medications   Medication Sig Start Date End Date Taking? Authorizing Provider  acetaminophen (TYLENOL) 325 MG tablet Take 2 tablets (650 mg total) by mouth every 6 (six) hours as needed for mild pain (or Fever >/= 101). 04/10/21  Yes Bonnielee Haff, MD  albuterol (VENTOLIN HFA) 108 (90 Base) MCG/ACT inhaler Inhale 1 puff into the lungs every 6 (six) hours as needed for shortness of breath.   Yes [provider]  Amino Acids-Protein Hydrolys (FEEDING SUPPLEMENT, PRO-STAT SUGAR FREE 64,) LIQD Take 30 mLs by mouth every evening.   Yes [provider]  amoxicillin-clavulanate (AUGMENTIN) 500-125 MG tablet Take 1 tablet by mouth See admin instructions. Bid x 7 days   Yes [provider]  aspirin EC 81 MG tablet Take 81 mg by mouth daily.   Yes [provider]  bisacodyl (DULCOLAX) 10 MG suppository Place 10 mg rectally daily as needed for moderate constipation.   Yes [provider]  busPIRone (BUSPAR) 5 MG tablet Take 5 mg by mouth 2 (two) times daily.   Yes [provider]  camphor-menthol Timoteo Ace) lotion Apply topically as needed  for itching. Patient taking differently: Apply 1 Application topically 2 (two) times daily as needed for itching. 04/23/21  Yes Alma Friendly, MD  cloNIDine (CATAPRES) 0.1 MG tablet Take 1 tablet (0.1 mg total) by mouth daily. 06/24/20  Yes Aslam, Loralyn Freshwater, MD  diltiazem (CARDIZEM CD) 180 MG 24 hr capsule Take 1 capsule (180 mg total) by mouth daily. 04/11/21  Yes Bonnielee Haff, MD  docusate sodium (COLACE) 100 MG capsule Take 1 capsule (100 mg total) by mouth every 12 (twelve) hours. 05/01/21  Yes Sherwood Gambler, MD  ergocalciferol (VITAMIN D2) 1.25 MG (50000 UT) capsule Take 50,000 Units by mouth every Friday.   Yes [provider]  ferrous sulfate 325 (65 FE) MG tablet Take 1 tablet by mouth See admin instructions.  Take one tablet by mouth Every Monday, Wednesday, and Friday 07/30/20  Yes [provider]  hydrALAZINE (APRESOLINE) 10 MG tablet Take 1 tablet (10 mg total) by mouth every 8 (eight) hours as needed (for SBP greater than 180). 04/04/21  Yes Little Ishikawa, MD  HYDROcodone-acetaminophen (NORCO/VICODIN) 5-325 MG tablet Take 1 tablet by mouth every 6 (six) hours as needed for moderate pain or severe pain. 04/23/21  Yes Alma Friendly, MD  hydrOXYzine (ATARAX) 10 MG tablet Take 10 mg by mouth 3 (three) times daily as needed for itching.   Yes [provider]  Ipratropium-Albuterol (COMBIVENT) 20-100 MCG/ACT AERS respimat Inhale 1 puff into the lungs 4 (four) times daily.   Yes [provider]  ipratropium-albuterol (DUONEB) 0.5-2.5 (3) MG/3ML SOLN Take 3 mLs by nebulization every 4 (four) hours as needed (shortness of breath/wheezing).   Yes [provider]  lanthanum (FOSRENOL) 1000 MG chewable tablet Chew 1 tablet (1,000 mg total) by mouth 3 (three) times daily with meals. 04/04/21  Yes Little Ishikawa, MD  levothyroxine (SYNTHROID) 50 MCG tablet Take 50 mcg by mouth daily before breakfast.   Yes [provider]  melatonin 5 MG TABS Take 5 mg by mouth at bedtime.   Yes [provider]  Menthol, Topical Analgesic, (BIOFREEZE) 4 % GEL Apply 1 Application topically 3 (three) times daily as needed (pain).   Yes [provider]  Multiple Vitamin (MULTIVITAMIN) tablet Take 1 tablet by mouth daily.   Yes [provider]  naphazoline-glycerin (CLEAR EYES REDNESS) 0.012-0.25 % SOLN Place 1-2 drops into the right eye 3 (three) times daily. 04/23/21  Yes Alma Friendly, MD  Nutritional Supplements (NOVASOURCE RENAL PO) Take by mouth in the morning and at bedtime.   Yes [provider]  ondansetron (ZOFRAN) 4 MG tablet Take 1 tablet (4 mg total) by mouth daily as needed for nausea or vomiting. 04/11/21 04/11/22 Yes Bonnielee Haff, MD  pantoprazole (PROTONIX) 40 MG tablet Take 1 tablet (40 mg total) by mouth 2 (two) times daily. 04/10/21  Yes Bonnielee Haff, MD  polyethylene glycol (MIRALAX / GLYCOLAX) 17 g packet Take 17 g by mouth daily. 05/01/21  Yes Sherwood Gambler, MD  pravastatin (PRAVACHOL) 40 MG tablet Take 40 mg by mouth at bedtime.   Yes [provider]  predniSONE (DELTASONE) 20 MG tablet Take 40 mg by mouth See admin instructions. Qd x 9 days   Yes [provider]  tamsulosin (FLOMAX) 0.4 MG CAPS capsule Take 1 capsule (0.4 mg total) by mouth daily. 04/11/21  Yes Bonnielee Haff, MD  methylPREDNISolone sodium succinate (SOLU-MEDROL, PF,) 40 MG injection ACT-O-VIAL Inject 40 mg into the muscle once. Patient not taking: Reported  on 08/14/2021    [provider]  Multiple Vitamins-Minerals (DECUBI-VITE PO) Take 1 tablet by mouth daily. Patient not taking: Reported on 08/14/2021    [provider]  neomycin-bacitracin-polymyxin (NEOSPORIN) OINT Apply 1 application topically 3 (three) times daily. To right antecubital area Patient not taking: Reported on 08/14/2021 04/10/21   Bonnielee Haff, MD  Nutritional Supplements (,FEEDING SUPPLEMENT, PROSOURCE PLUS) liquid Take 30 mLs by mouth 2 (two) times daily between meals. Patient not taking: Reported on 08/14/2021 04/23/21   Alma Friendly, MD  Nutritional Supplements (FEEDING SUPPLEMENT, NEPRO CARB STEADY,) LIQD Take 237 mLs by mouth 3 (three) times daily before meals. Patient not taking: Reported on 08/14/2021 04/23/21   Alma Friendly, MD   Current Facility-Administered Medications  Medication Dose Route Frequency Provider Last Rate Last Admin   acetaminophen (TYLENOL) tablet 650 mg  650 mg Oral Q6H PRN Opyd, Ilene Qua, MD       albuterol (PROVENTIL) (2.5 MG/3ML) 0.083% nebulizer solution 2.5 mg  2.5 mg Nebulization Q2H PRN Opyd, Ilene Qua, MD       aspirin EC tablet 81 mg  81 mg Oral Daily [place] Eric J, DO   81 mg at  08/14/21 1029   azithromycin (ZITHROMAX) tablet 500 mg  500 mg Oral Daily Opyd, Ilene Qua, MD       busPIRone (BUSPAR) tablet 5 mg  5 mg Oral BID [place] Eric J, DO   5 mg at 08/14/21 1030   cefTRIAXone (ROCEPHIN) 2 g in sodium chloride 0.9 % 100 mL IVPB  2 g Intravenous Q24H Opyd, Ilene Qua, MD       cloNIDine (CATAPRES) tablet 0.1 mg  0.1 mg Oral Daily [place] Donnamarie Poag, DO   0.1 mg at 08/14/21 1029   diltiazem (CARDIZEM CD) 24 hr capsule 180 mg  180 mg Oral Daily [place] Eric J, DO   180 mg at 08/14/21 1030   docusate sodium (COLACE) capsule 100 mg  100 mg Oral Q12H [place] Donnamarie Poag, DO       [START ON 08/15/2021] ferrous sulfate tablet 325 mg  325 mg Oral Q M,W,F-2000 [place] Eric J, DO       guaiFENesin (ROBITUSSIN) 100 MG/5ML liquid 5 mL  5 mL Oral Q4H PRN Opyd, Ilene Qua, MD       heparin injection 5,000 Units  5,000 Units Subcutaneous Q8H Opyd, Ilene Qua, MD   5,000 Units at 08/14/21 0511   HYDROmorphone (DILAUDID) injection 0.5 mg  0.5 mg Intravenous Once PRN Opyd, Ilene Qua, MD       hydrOXYzine (ATARAX) tablet 10 mg  10 mg Oral TID PRN [place] Eric J, DO       ipratropium-albuterol (DUONEB) 0.5-2.5 (3) MG/3ML nebulizer solution 3 mL  3 mL Nebulization TID Opyd, Ilene Qua, MD   3 mL at 08/13/21 2233   levothyroxine (SYNTHROID) tablet 50 mcg  50 mcg Oral Q0600 [place] Eric J, DO   50 mcg at 08/14/21 0759   melatonin tablet 5 mg  5 mg Oral QHS [place] Eric J, DO       methylPREDNISolone sodium succinate (SOLU-MEDROL) 125 mg/2 mL injection 80 mg  80 mg Intravenous Q12H Opyd, Ilene Qua, MD   80 mg at 08/14/21 4128   Followed by   Derrill Memo ON 08/15/2021] predniSONE (DELTASONE) tablet 40 mg  40 mg Oral Q breakfast Opyd, Ilene Qua, MD       oxyCODONE (Oxy IR/ROXICODONE) immediate release tablet 5 mg  5 mg Oral Q4H  PRN Vianne Bulls, MD   5 mg at 08/14/21 0511   pantoprazole (PROTONIX) EC tablet 40 mg  40 mg Oral BID [place] Eric J, DO   40 mg at 08/14/21 1029   polyethylene glycol (MIRALAX /  GLYCOLAX) packet 17 g  17 g Oral Daily PRN [place] Eric J, DO       pravastatin (PRAVACHOL) tablet 40 mg  40 mg Oral QHS [place] Eric J, DO       senna-docusate (Senokot-S) tablet 1 tablet  1 tablet Oral QHS PRN Opyd, Ilene Qua, MD       tamsulosin (FLOMAX) capsule 0.4 mg  0.4 mg Oral Daily [place] Donnamarie Poag, DO   0.4 mg at 08/14/21 1029   Current Outpatient Medications  Medication Sig Dispense Refill   acetaminophen (TYLENOL) 325 MG tablet Take 2 tablets (650 mg total) by mouth every 6 (six) hours as needed for mild pain (or Fever >/= 101).     albuterol (VENTOLIN HFA) 108 (90 Base) MCG/ACT inhaler Inhale 1 puff into the lungs every 6 (six) hours as needed for shortness of breath.     Amino Acids-Protein Hydrolys (FEEDING SUPPLEMENT, PRO-STAT SUGAR FREE 64,) LIQD Take 30 mLs by mouth every evening.     amoxicillin-clavulanate (AUGMENTIN) 500-125 MG tablet Take 1 tablet by mouth See admin instructions. Bid x 7 days     aspirin EC 81 MG tablet Take 81 mg by mouth daily.     bisacodyl (DULCOLAX) 10 MG suppository Place 10 mg rectally daily as needed for moderate constipation.     busPIRone (BUSPAR) 5 MG tablet Take 5 mg by mouth 2 (two) times daily.     camphor-menthol (SARNA) lotion Apply topically as needed for itching. (Patient taking differently: Apply 1 Application topically 2 (two) times daily as needed for itching.) 222 mL 0   cloNIDine (CATAPRES) 0.1 MG tablet Take 1 tablet (0.1 mg total) by mouth daily. 30 tablet 0   diltiazem (CARDIZEM CD) 180 MG 24 hr capsule Take 1 capsule (180 mg total) by mouth daily.     docusate sodium (COLACE) 100 MG capsule Take 1 capsule (100 mg total) by mouth every 12 (twelve) hours. 60 capsule 0   ergocalciferol (VITAMIN D2) 1.25 MG (50000 UT) capsule Take 50,000 Units by mouth every Friday.     ferrous sulfate 325 (65 FE) MG tablet Take 1 tablet by mouth See admin instructions. Take one tablet by mouth Every Monday, Wednesday, and Friday     hydrALAZINE  (APRESOLINE) 10 MG tablet Take 1 tablet (10 mg total) by mouth every 8 (eight) hours as needed (for SBP greater than 180). 90 tablet 0   HYDROcodone-acetaminophen (NORCO/VICODIN) 5-325 MG tablet Take 1 tablet by mouth every 6 (six) hours as needed for moderate pain or severe pain. 25 tablet 0   hydrOXYzine (ATARAX) 10 MG tablet Take 10 mg by mouth 3 (three) times daily as needed for itching.     Ipratropium-Albuterol (COMBIVENT) 20-100 MCG/ACT AERS respimat Inhale 1 puff into the lungs 4 (four) times daily.     ipratropium-albuterol (DUONEB) 0.5-2.5 (3) MG/3ML SOLN Take 3 mLs by nebulization every 4 (four) hours as needed (shortness of breath/wheezing).     lanthanum (FOSRENOL) 1000 MG chewable tablet Chew 1 tablet (1,000 mg total) by mouth 3 (three) times daily with meals. 90 tablet 0   levothyroxine (SYNTHROID) 50 MCG tablet Take 50 mcg by mouth daily before breakfast.     melatonin 5 MG TABS Take 5  mg by mouth at bedtime.     Menthol, Topical Analgesic, (BIOFREEZE) 4 % GEL Apply 1 Application topically 3 (three) times daily as needed (pain).     Multiple Vitamin (MULTIVITAMIN) tablet Take 1 tablet by mouth daily.     naphazoline-glycerin (CLEAR EYES REDNESS) 0.012-0.25 % SOLN Place 1-2 drops into the right eye 3 (three) times daily.     Nutritional Supplements (NOVASOURCE RENAL PO) Take by mouth in the morning and at bedtime.     ondansetron (ZOFRAN) 4 MG tablet Take 1 tablet (4 mg total) by mouth daily as needed for nausea or vomiting. 30 tablet 1   pantoprazole (PROTONIX) 40 MG tablet Take 1 tablet (40 mg total) by mouth 2 (two) times daily.     polyethylene glycol (MIRALAX / GLYCOLAX) 17 g packet Take 17 g by mouth daily. 14 each 0   pravastatin (PRAVACHOL) 40 MG tablet Take 40 mg by mouth at bedtime.     predniSONE (DELTASONE) 20 MG tablet Take 40 mg by mouth See admin instructions. Qd x 9 days     tamsulosin (FLOMAX) 0.4 MG CAPS capsule Take 1 capsule (0.4 mg total) by mouth daily. 30  capsule    methylPREDNISolone sodium succinate (SOLU-MEDROL, PF,) 40 MG injection ACT-O-VIAL Inject 40 mg into the muscle once. (Patient not taking: Reported on 08/14/2021)     Multiple Vitamins-Minerals (DECUBI-VITE PO) Take 1 tablet by mouth daily. (Patient not taking: Reported on 08/14/2021)     neomycin-bacitracin-polymyxin (NEOSPORIN) OINT Apply 1 application topically 3 (three) times daily. To right antecubital area (Patient not taking: Reported on 08/14/2021)     Nutritional Supplements (,FEEDING SUPPLEMENT, PROSOURCE PLUS) liquid Take 30 mLs by mouth 2 (two) times daily between meals. (Patient not taking: Reported on 08/14/2021)     Nutritional Supplements (FEEDING SUPPLEMENT, NEPRO CARB STEADY,) LIQD Take 237 mLs by mouth 3 (three) times daily before meals. (Patient not taking: Reported on 08/14/2021)  0   Labs: Basic Metabolic Panel: Recent Labs  Lab 08/13/21 1758 08/14/21 0447  NA 139 138  K 4.1 4.9  CL 99 96*  CO2 26 19*  GLUCOSE 96 156*  BUN 20 33*  CREATININE 3.37* 4.59*  CALCIUM 9.4 9.2   Liver Function Tests: Recent Labs  Lab 08/13/21 1758  AST 16  ALT 13  ALKPHOS 82  BILITOT 0.7  PROT 7.4  ALBUMIN 3.2*   No results for input(s): "LIPASE", "AMYLASE" in the last 168 hours. No results for input(s): "AMMONIA" in the last 168 hours. CBC: Recent Labs  Lab 08/13/21 1758 08/14/21 0447  WBC 8.8 5.7  NEUTROABS 5.8  --   HGB 13.5 13.0  HCT 41.1 40.5  MCV 90.9 92.7  PLT 283 300   Cardiac Enzymes: No results for input(s): "CKTOTAL", "CKMB", "CKMBINDEX", "TROPONINI" in the last 168 hours. CBG: No results for input(s): "GLUCAP" in the last 168 hours. Iron Studies: No results for input(s): "IRON", "TIBC", "TRANSFERRIN", "FERRITIN" in the last 72 hours. Studies/Results: DG Chest Port 1 View  Result Date: 08/13/2021 CLINICAL DATA:  Questionable sepsis - evaluate for abnormality EXAM: PORTABLE CHEST 1 VIEW COMPARISON:  Chest x-ray 04/06/2021, CT chest 08/12/2020  FINDINGS: Right chest wall dialysis catheter with tip overlying the superior cavoatrial junction. The heart and mediastinal contours are unchanged. Aortic calcification. Prominent hilar regions. Right base streaky airspace opacity. No focal consolidation. Chronic coarsened markings with likely increased superimposed interstitial markings. No pleural effusion. No pneumothorax. No acute osseous abnormality.  Old healed right rib fractures.  IMPRESSION: 1. Mild pulmonary edema. 2. Right base streaky airspace opacity. Finding may represent a infection/inflammation. Followup PA and lateral chest X-ray is recommended in 3-4 weeks following therapy to ensure resolution and exclude underlying malignancy. 3. Aortic Atherosclerosis (ICD10-I70.0) and Emphysema (ICD10-J43.9). Electronically Signed   By: Iven Finn M.D.   On: 08/13/2021 18:25    ROS: As per HPI otherwise negative.  Physical Exam: Vitals:   08/14/21 0551 08/14/21 0600 08/14/21 0700 08/14/21 1000  BP:  (!) 153/60 (!) 163/62 (!) 162/66  Pulse: (!) 110 (!) 111 (!) 105 (!) 105  Resp: '17 18 16 15  '$ Temp:      TempSrc:      SpO2: 96% 94% 93% 93%     General: Chronically ill in mild acute distress. Head: Normocephalic, atraumatic, sclera non-icteric, mucus membranes are dry Neck: Supple. JVD elevated 1/2 to mandible. Lungs: Breathing is labored. Expiratory wheezing with basilar crackles. O2 via [place]  @ 5L/M.O2 Sats 93.  Heart: ST S1,S2 RRR No M, S1.S2 +S4.  Abdomen: Soft, non-tender, non-distended with normoactive bowel sounds. No rebound/guarding. No obvious abdominal masses. Lower extremities:Trace-1+ BLE edema with erythema present BLE.  Neuro: Alert and oriented X 3. Moves all extremities spontaneously. Psych:  Responds to questions appropriately with a normal affect. Dialysis Access: R AVF +T/B RIJ TDC with erythema around site, severe itching.  Dialysis Orders:[facility] MWF 4 hrs 180NRe 300/500 76 kg 3.0K/2.5 Ca TDC/AVF -Heparin 3000 units IV  TIW -Venofer 100 mg IV X 10 (2/10 doses have been given)   Assessment/Plan:  CAP/Acute hypoxic respiratory failure-ABX per primary  pulmonary edema/Volume overload-pulmonary edema noted on CXR and by exam.  Urgent HD today. Attempt to optimize volume with HD.  COPD exacerbation-per primary  Pleuritic Pain- pain between shoulder blades worse with inspiration. W/U per primary  ESRD -  MWF. Extra treatment today-sequential, regular HD tomorrow. Erythema around [facility]. Needs TDC to be removed. Will consult IR.   Hypertension/volume  - BP high. Resume home medication. Volume   Anemia  - HGB 13. Does not need Fe load. Follow HGB.   Metabolic bone disease -  Continue binders. RFP added to today's labs.   Nutrition - Albumin low. Add protein supps.   H/O CVA H/O CVA H/O PAF not on AC.    Rita H. Owens Shark, NP-C 08/14/2021, 11:21 AM  [employer] [phone]   Seen and examined independently.  Agree with note and exam as documented above by physician extender and as noted here.  Wayne Meadows is a pleasant gentleman with a history of ESRD on MWF at [facility], HTN, COPD, CAD, and pafib who presented to the hospital with shortness of breath.  He states that he has had PNA for two weeks and that his symptoms have persisted and worsened despite outpatient treatment with oral meds.  He got a dose of IV medications outpatient HD and this helped transiently but then he presented today.  He did go to HD yesterday and left above his EDW.  He states that they have been cannulating his AVF for a couple of weeks.  He does share that he has lost quite a lot of weight since January 2023 due to cellulitis of his legs - he estimates 60 lbs.   General elderly male in bed short of breath with coughing fits HEENT normocephalic atraumatic extraocular movements intact sclera anicteric Neck supple trachea midline Lungs reduced breath sounds; wet sounding cough frequently; head of bed elevated. On oxygen via  nasal  cannula Heart S1S2 no rub Abdomen soft nontender nondistended Extremities trace edema  Psych normal mood and affect Neuro - alert and oriented x 3  Right chest tunneled catheter - erythema around site. RUE AVF with bruit and thrill   # Healthcare associated PNA  - abx per primary team   # Acute hypoxic respiratory failure - optimize volume status/lung compliance with additional UF only treatment today  - covid screen negative - appreciate primary team  - would consult pulm if his respiratory status does not improve with volume removal - does he need bronch?   # COPD exacerbation  - per primary team   # ESRD  - additional UF treatment today then resume his HD per MWF schedule tomorrow  - we are getting his tunneled catheter out as AVF has been functional two weeks per outpatient unit  # HTN  - optimize volume status with HD  # Anemia CKD - no indication for ESA. Outpatient rx as above  # Metabolic bone disease - continue binders when taking PO. Activated vit D rx as above   Claudia Desanctis, MD 08/14/2021 4:04 PM

## 2021-08-14 NOTE — Progress Notes (Signed)
Wayne NOTE    TORRENCE HAMMACK  VOZ:[id number] DOB: 02/28/1934 DOA: 08/13/2021 PCP: Rexene Agent, Wayne    Brief Narrative:   LUMAN HOLWAY is a 87 y.o. male with past medical history significant for renal cell Meadows, Wayne Meadows, Wayne Meadows, Wayne Meadows, Wayne Meadows, Wayne Meadows, hypothyroidism, GERD, depression/anxiety, anemia of Wayne renal disease, iron deficiency anemia, documented history of paroxysmal atrial fibrillation not on anticoagulation who presents to [facility] ED via EMS from [facility] SNF with complaints of shortness of breath, cough and pleuritic pain.  Patient reports he had been receiving treatment for pneumonia at his facility, but despite with persistent shortness of breath/cough and severe pleuritic pain near his right scapula.  Patient reports completing his hemodialysis session earlier in the day, but despite continued to be short of breath and was transported to the hospital for further evaluation.  EMS treated with Solu-Medrol and nebulized breathing treatments prior to arrival to the ED.  Patient reports that his weight has been stable with hemodialysis.  In the ED, temperature 99.1 F, HR 103, RR 18, BP 164/67, SPO2 88% on 3 L nasal Meadows.  WBC 8.8, hemoglobin 13.5, platelets 283.  Sodium 139, potassium 4.1, chloride 99, CO2 26, glucose 96, BUN 20, creatinine 3.37.  AST 16, ALT 13, total bilirubin 0.7.  Lactic acid 1.2.  Chest x-ray with mild pulmonary edema, right base streaky airspace opacity consistent with infection versus inflammation, aortic atherosclerosis and Wayne Meadows.  Blood cultures x2 obtained.  Started on ceftriaxone and azithromycin.  TRH consulted for further evaluation and management of pneumonia and COPD exacerbation.  Assessment & Plan:   Acute on Wayne hypoxic respiratory failure Patient presenting to ED via EMS from SNF following dialysis which she was compliant.  Patient on 3 L nasal Meadows at baseline;  and on arrival was noted to be 88% on his 3 L which was increased to 6 L.  Etiology likely multifactorial given right base opacity on chest x-ray concerning for community-acquired pneumonia as well as COPD exacerbation. --Continue treatment as below --Continue to titrate supplemental oxygen, maintain SPO2 >/= 88%  Community-acquired pneumonia Chest x-ray on arrival with right base streaky airspace opacity consistent with infection.  Apparently patient was being treated for pneumonia outpatient with Augmentin.  Patient is afebrile without leukocytosis but elevated procalcitonin of 0.23. --Strep pneumonia and Legionella urinary antigen: Pending --Blood cultures x2: No growth <12h --Azithromycin 500 mg p.o. daily x5 days --Ceftriaxone 2 g IV every 24 hours x5 days  COPD exacerbation Patient reports progressive wheezing over the last few days.  At baseline utilizes Combivent inhaler 1 puff 4 times daily, albuterol and DuoNebs as needed. --Solu-Medrol 80 mg IV q12h x 1 day; followed by prednisone 40 mg daily, plan 5-day course --DuoNebs schedule III times daily --Albuterol neb every 2 hours as needed shortness of breath/wheezing --Guaifenesin every 4 hours as needed cough --Continue supplemental oxygen, maintain SPO2 >/= 88%  Pleuritic pain Patient reports pleuritic pain on deep inspiration to right scapular region.  EKG with sinus tachycardia and no concerning dynamic changes. --Checking D-dimer given history of proximal atrial fibrillation not on anticoagulation, if elevated will order CTA chest to rule out PE and bilateral lower extremity ultrasound  Essential hypertension --Cardizem CD100 80 mg p.o. daily --Clonidine 0.1 mg p.o. daily  Wayne on HD Patient receives dialysis on a Monday/Wednesday/Friday schedule on [employer].  Reports full dialysis session on 08/13/2021.  Currently patient states receiving dialysis through  his right upper extremity AV fistula.  Also has tunneled HD catheter  right chest in place. --Nephrology consulted for continued HD while inpatient --May need increased UF given pulmonary edema noted on chest x-ray --Also discussed with nephrology regarding if able to remove tunneled HD catheter given patient's reaction to the dressing causing some localized erythema to the site --Strict I's and O's and daily weights --Renal panel in the a.m.  Hypothyroidism --Levothyroxine 50 mcg p.o. daily  HLD: Pravastatin 40 mg p.o. daily  BPH: Tamsulosin 0.4 mg p.o. daily  History of paroxysmal atrial fibrillation not on anticoagulation EKG on admission with sinus tachycardia.  Currently not on anticoagulation.  Checking D-dimer as above. --Continue to monitor on telemetry  Wayne Meadows --Continue aspirin and statin  Depression/anxiety: BuSpar 5 mg p.o. twice daily  Anemia of Wayne renal/medical disease Iron deficiency anemia Hemoglobin 13.0, stable. --Ferrous sulfate 325 mg daily on Monday/Wednesday/Friday  GERD: Protonix 40 g p.o. twice daily  Weakness/deconditioning/gait disturbance: Currently resides at [facility] SNF.  Prior to that was living alone in his own home. --PT/OT evaluation   DVT prophylaxis: heparin injection 5,000 Units Start: 08/13/21 2200    Code Status: Full Code Family Communication: No family present at bedside this morning  Disposition Plan:  Level of care: Med-Surg Status is: Inpatient Remains inpatient appropriate because: Continued need of IV antibiotics, further work-up for pleuritic pain, IV steroids and scheduled DuoNebs.  Anticipate discharge back to SNF in 2-3 days    Consultants:  Nephrology, Dr. Royce Macadamia  Procedures:  None  Antimicrobials:  Azithromycin 6/21>> Ceftriaxone 6/21>>   Subjective: Patient seen examined bedside, resting calmly.  Remains in ED holding area.  Continues with intermittent productive cough with reported white sputum.  Shortness of breath improved but continues with occasional pleuritic pain  on deep inspiration to right shoulder/scapular region.  Feels tired and fatigued.  No other questions or concerns at this time other than requesting a warm blanket.  Denies headache, no dizziness, no chest pain, no palpitations, no abdominal pain, no fever/chills/night sweats, no nausea/vomiting/diarrhea, no focal weakness, no paresthesias.  No acute events overnight per nursing staff.  Objective: Vitals:   08/14/21 0551 08/14/21 0600 08/14/21 0700 08/14/21 1000  BP:  (!) 153/60 (!) 163/62 (!) 162/66  Pulse: (!) 110 (!) 111 (!) 105 (!) 105  Resp: '17 18 16 15  '$ Temp:      TempSrc:      SpO2: 96% 94% 93% 93%   No intake or output data in the 24 hours ending 08/14/21 1039 There were no vitals filed for this visit.  Examination:  Physical Exam: GEN: NAD, alert and oriented x 3, chronically ill in appearance HEENT: NCAT, PERRL, EOMI, sclera clear, MMM PULM: Breath sounds slight diminished bilateral bases with crackles, mid to late expiratory wheezing, normal respiratory effort without accessory muscle use, on 3 L nasal Meadows with SPO2 93% at rest. CV: RRR w/o M/G/R, right chest tunneled HD catheter noted with dressing in place, with mild erythema under dressing that is unchanged per patient as he states has localized reaction to this GI: abd soft, NTND, NABS, no R/G/M MSK: Trace bilateral lower extremity peripheral edema to mid shin that is symmetric, muscle strength globally intact 5/5 bilateral upper/lower extremities NEURO: CN II-XII intact, no focal deficits, sensation to light touch intact PSYCH: normal mood/affect Integumentary: Wayne venous changes bilateral lower extremities to anterior shins, redness noted under dressing to right chest tunneled HD catheter that is unchanged per patient, otherwise no other  concerning wounds/rashes/lesions    Data Reviewed: I have personally reviewed following labs and imaging studies  CBC: Recent Labs  Lab 08/13/21 1758 08/14/21 0447  WBC  8.8 5.7  NEUTROABS 5.8  --   HGB 13.5 13.0  HCT 41.1 40.5  MCV 90.9 92.7  PLT 283 027   Basic Metabolic Panel: Recent Labs  Lab 08/13/21 1758 08/14/21 0447  NA 139 138  K 4.1 4.9  CL 99 96*  CO2 26 19*  GLUCOSE 96 156*  BUN 20 33*  CREATININE 3.37* 4.59*  CALCIUM 9.4 9.2   GFR: CrCl cannot be calculated (Unknown ideal weight.). Liver Function Tests: Recent Labs  Lab 08/13/21 1758  AST 16  ALT 13  ALKPHOS 82  BILITOT 0.7  PROT 7.4  ALBUMIN 3.2*   No results for input(s): "LIPASE", "AMYLASE" in the last 168 hours. No results for input(s): "AMMONIA" in the last 168 hours. Coagulation Profile: Recent Labs  Lab 08/13/21 1758  INR 1.1   Cardiac Enzymes: No results for input(s): "CKTOTAL", "CKMB", "CKMBINDEX", "TROPONINI" in the last 168 hours. BNP (last 3 results) No results for input(s): "PROBNP" in the last 8760 hours. HbA1C: No results for input(s): "HGBA1C" in the last 72 hours. CBG: No results for input(s): "GLUCAP" in the last 168 hours. Lipid Profile: No results for input(s): "CHOL", "HDL", "LDLCALC", "TRIG", "CHOLHDL", "LDLDIRECT" in the last 72 hours. Thyroid Function Tests: No results for input(s): "TSH", "T4TOTAL", "FREET4", "T3FREE", "THYROIDAB" in the last 72 hours. Anemia Panel: No results for input(s): "VITAMINB12", "FOLATE", "FERRITIN", "TIBC", "IRON", "RETICCTPCT" in the last 72 hours. Sepsis Labs: Recent Labs  Lab 08/13/21 1758 08/14/21 0447  PROCALCITON  --  0.23  LATICACIDVEN 1.2  --     Recent Results (from the past 240 hour(s))  Blood Culture (routine x 2)     Status: None (Preliminary result)   Collection Time: 08/13/21  6:04 PM   Specimen: BLOOD  Result Value Ref Range Status   Specimen Description BLOOD LEFT ANTECUBITAL  Final   Special Requests   Final    BOTTLES DRAWN AEROBIC AND ANAEROBIC Blood Culture results may not be optimal due to an inadequate volume of blood received in culture bottles   Culture   Final    NO  GROWTH < 12 HOURS Performed at [facility], [place] [address], [place], [place] [address]    Report Status PENDING  Incomplete         Radiology Studies: DG Chest Port 1 View  Result Date: 08/13/2021 CLINICAL DATA:  Questionable sepsis - evaluate for abnormality EXAM: PORTABLE CHEST 1 VIEW COMPARISON:  Chest x-ray 04/06/2021, CT chest 08/12/2020 FINDINGS: Right chest wall dialysis catheter with tip overlying the superior cavoatrial junction. The heart and mediastinal contours are unchanged. Aortic calcification. Prominent hilar regions. Right base streaky airspace opacity. No focal consolidation. Wayne coarsened markings with likely increased superimposed interstitial markings. No pleural effusion. No pneumothorax. No acute osseous abnormality.  Old healed right rib fractures. IMPRESSION: 1. Mild pulmonary edema. 2. Right base streaky airspace opacity. Finding may represent a infection/inflammation. Followup PA and lateral chest X-ray is recommended in 3-4 weeks following therapy to ensure resolution and exclude underlying malignancy. 3. Aortic Atherosclerosis (ICD10-I70.0) and Wayne Meadows (ICD10-J43.9). Electronically Signed   By: Iven Finn M.D.   On: 08/13/2021 18:25        Scheduled Meds:  aspirin EC  81 mg Oral Daily   azithromycin  500 mg Oral Daily   busPIRone  5  mg Oral BID   cloNIDine  0.1 mg Oral Daily   diltiazem  180 mg Oral Daily   docusate sodium  100 mg Oral Q12H   [START ON 08/15/2021] ferrous sulfate  325 mg Oral Q M,W,F-2000   heparin  5,000 Units Subcutaneous Q8H   ipratropium-albuterol  3 mL Nebulization TID   levothyroxine  50 mcg Oral Q0600   melatonin  5 mg Oral QHS   methylPREDNISolone (SOLU-MEDROL) injection  80 mg Intravenous Q12H   Followed by   Derrill Memo ON 08/15/2021] predniSONE  40 mg Oral Q breakfast   pantoprazole  40 mg Oral BID   pravastatin  40 mg Oral QHS   tamsulosin  0.4 mg Oral Daily   Continuous Infusions:  cefTRIAXone (ROCEPHIN)  IV        LOS: 1 day    Time spent: 52 minutes spent on chart review, discussion with nursing staff, consultants, updating family and interview/physical exam; more than 50% of that time was spent in counseling and/or coordination of care.    Elnora Quizon J [place] DO [facility] Available via Epic secure chat 7am-7pm After these hours, please refer to coverage provider listed on [email or web address] 08/14/2021, 10:39 AM

## 2021-08-15 ENCOUNTER — Inpatient Hospital Stay (HOSPITAL_COMMUNITY): Payer: No Typology Code available for payment source

## 2021-08-15 DIAGNOSIS — J441 Chronic obstructive pulmonary disease with (acute) exacerbation: Secondary | ICD-10-CM | POA: Diagnosis not present

## 2021-08-15 DIAGNOSIS — I2510 Atherosclerotic heart disease of native coronary artery without angina pectoris: Secondary | ICD-10-CM | POA: Diagnosis not present

## 2021-08-15 DIAGNOSIS — J9621 Acute and chronic respiratory failure with hypoxia: Secondary | ICD-10-CM | POA: Diagnosis not present

## 2021-08-15 DIAGNOSIS — J189 Pneumonia, unspecified organism: Secondary | ICD-10-CM | POA: Diagnosis not present

## 2021-08-15 HISTORY — PX: IR REMOVAL TUN CV CATH W/O FL: IMG2289

## 2021-08-15 LAB — CBC
HCT: 38.9 % — ABNORMAL LOW (ref 39.0–52.0)
Hemoglobin: 12.9 g/dL — ABNORMAL LOW (ref 13.0–17.0)
MCH: 29.3 pg (ref 26.0–34.0)
MCHC: 33.2 g/dL (ref 30.0–36.0)
MCV: 88.4 fL (ref 80.0–100.0)
Platelets: 306 10*3/uL (ref 150–400)
RBC: 4.4 MIL/uL (ref 4.22–5.81)
RDW: 14.7 % (ref 11.5–15.5)
WBC: 11.8 10*3/uL — ABNORMAL HIGH (ref 4.0–10.5)
nRBC: 0 % (ref 0.0–0.2)

## 2021-08-15 LAB — RENAL FUNCTION PANEL
Albumin: 3 g/dL — ABNORMAL LOW (ref 3.5–5.0)
Anion gap: 14 (ref 5–15)
BUN: 37 mg/dL — ABNORMAL HIGH (ref 8–23)
CO2: 27 mmol/L (ref 22–32)
Calcium: 9.3 mg/dL (ref 8.9–10.3)
Chloride: 94 mmol/L — ABNORMAL LOW (ref 98–111)
Creatinine, Ser: 4.2 mg/dL — ABNORMAL HIGH (ref 0.61–1.24)
GFR, Estimated: 13 mL/min — ABNORMAL LOW (ref >60)
Glucose, Bld: 177 mg/dL — ABNORMAL HIGH (ref 70–99)
Phosphorus: 6.2 mg/dL — ABNORMAL HIGH (ref 2.5–4.6)
Potassium: 4.8 mmol/L (ref 3.5–5.1)
Sodium: 135 mmol/L (ref 135–145)

## 2021-08-15 LAB — HEPATITIS B SURFACE ANTIBODY, QUANTITATIVE: Hep B S AB Quant (Post): <3.1 m[IU]/mL — ABNORMAL LOW (ref >9.9)

## 2021-08-15 LAB — PROCALCITONIN: Procalcitonin: 0.24 ng/mL

## 2021-08-15 LAB — MRSA NEXT GEN BY PCR, NASAL: MRSA by PCR Next Gen: POSITIVE — AB

## 2021-08-15 MED ORDER — LIDOCAINE HCL (PF) 1 % IJ SOLN
INTRAMUSCULAR | Status: DC | PRN
  Administered 2021-08-15: 10 mL

## 2021-08-15 MED ORDER — LANTHANUM CARBONATE 500 MG PO CHEW
1000.0000 mg | CHEWABLE_TABLET | Freq: Three times a day (TID) | ORAL | Status: DC
Start: 2021-08-15 — End: 2021-08-18
  Administered 2021-08-15 – 2021-08-16 (×5): 1000 mg via ORAL
  Filled 2021-08-15 (×6): qty 2

## 2021-08-15 MED ORDER — IOHEXOL 350 MG/ML SOLN
60.0000 mL | Freq: Once | INTRAVENOUS | Status: AC | PRN
  Administered 2021-08-15: 60 mL via INTRAVENOUS

## 2021-08-15 NOTE — Progress Notes (Signed)
UF goal increased by 0.5kgs to equal a total UF of 3.0kgs per Dr. Malen Gauze.

## 2021-08-15 NOTE — TOC Progression Note (Signed)
Transition of Care [facility]) - Initial/Assessment Note    Patient Details  Name: Wayne Meadows MRN: [id number] Date of Birth: 26-May-1934  Transition of Care [facility]) CM/SW Contact:    Ralene Bathe, LCSWA Phone Number: 08/15/2021, 3:39 PM  Clinical Narrative:                  Transition of Care Department [facility]) has reviewed patient.  Patient is LTC @ [facility] (SNF) and can return when medically ready.  TOC will continue to follow.   Patient Goals and CMS Choice        Expected Discharge Plan and Services                                                Prior Living Arrangements/Services                       Activities of Daily Living Home Assistive Devices/Equipment: Environmental consultant (specify type), Eyeglasses, Dentures (specify type) ADL Screening (condition at time of admission) Patient's cognitive ability adequate to safely complete daily activities?: Yes Is the patient deaf or have difficulty hearing?: No Does the patient have difficulty seeing, even when wearing glasses/contacts?: No Does the patient have difficulty concentrating, remembering, or making decisions?: No Patient able to express need for assistance with ADLs?: Yes Does the patient have difficulty dressing or bathing?: No Independently performs ADLs?: Yes (appropriate for developmental age) Does the patient have difficulty walking or climbing stairs?: Yes Weakness of Legs: Both Weakness of Arms/Hands: None  Permission Sought/Granted                  Emotional Assessment              Admission diagnosis:  Right lower lobe pneumonia [J18.9] Acute respiratory failure with hypoxia (HCC) [J96.01] Community acquired pneumonia of right lower lobe of lung [J18.9] Patient Active Problem List   Diagnosis Date Noted   Right lower lobe pneumonia 08/13/2021   Acute on chronic respiratory failure with hypoxia (HCC) 08/13/2021   Arteriovenous fistula infection (HCC) 04/18/2021    Gastric ulcer without hemorrhage or perforation    Duodenal ulcer    Elevated INR    Acute upper GI bleed 04/06/2021   ESRD (end stage renal disease) on dialysis (HCC) 04/06/2021   Supratherapeutic INR 04/06/2021   Proteinuria 03/07/2021   AKI (acute kidney injury) (HCC) 03/05/2021   Hypothyroidism 03/05/2021   Anxiety 03/05/2021   Recurrent cellulitis of lower leg 03/04/2021   Chronic pancreatitis (HCC) 12/24/2020   Nausea and vomiting in adult 06/22/2020   Esophageal stricture    Hyperbilirubinemia 04/11/2016   Elevated lactic acid level    Segmental colitis (HCC) 09/04/2015   Noninfectious gastroenteritis, unspecified    Benign neoplasm of transverse colon    Benign neoplasm of colon    Bright red blood per rectum    Dysphagia    Renal cell cancer (HCC)    Hypertension    Centrilobular emphysema (HCC)    Blood in stool 08/19/2015   Rectal bleeding 08/19/2015   CAP (community acquired pneumonia) 04/08/2014   Overweight (BMI 25.0-29.9) 04/08/2014   Healthcare-associated pneumonia 10/26/2011   Cholelithiasis 04/26/2011   Atrial fibrillation (HCC) 04/26/2011   Chest pain 03/19/2011   Unstable angina (HCC) 03/18/2011    Class: Acute   Lung nodule 03/02/2011  Liver lesion 03/02/2011   Other hyperlipidemia 03/02/2011   Abnormal CT of the abdomen 02/23/2011   Non Q wave myocardial infarction (HCC) 02/22/2011    Class: Acute   History of CVA (cerebrovascular accident) 02/22/2011   CAD (coronary artery disease) 02/22/2011   COPD with acute exacerbation (HCC) 02/22/2011   Tobacco abuse, 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/22/2011   PCP:  Arita Miss, MD Pharmacy:  No Pharmacies Listed    Social Determinants of Health (SDOH) Interventions    Readmission Risk Interventions    03/21/2021    1:21 PM 03/19/2021    1:23 PM  Readmission Risk Prevention Plan  Transportation Screening Complete   HRI or Home Care Consult Complete   SW Recovery Care/Counseling Consult Complete Complete   Skilled Nursing Facility Complete Complete

## 2021-08-15 NOTE — Progress Notes (Signed)
Note entered by Moishe Spice, LPN reflecting Calcium drip d'cd was entered in error.  Patient was not on a Calcium drip.

## 2021-08-16 ENCOUNTER — Inpatient Hospital Stay (HOSPITAL_COMMUNITY): Payer: No Typology Code available for payment source

## 2021-08-16 DIAGNOSIS — J441 Chronic obstructive pulmonary disease with (acute) exacerbation: Secondary | ICD-10-CM | POA: Diagnosis not present

## 2021-08-16 DIAGNOSIS — I2510 Atherosclerotic heart disease of native coronary artery without angina pectoris: Secondary | ICD-10-CM | POA: Diagnosis not present

## 2021-08-16 DIAGNOSIS — J9621 Acute and chronic respiratory failure with hypoxia: Secondary | ICD-10-CM | POA: Diagnosis not present

## 2021-08-16 DIAGNOSIS — J189 Pneumonia, unspecified organism: Secondary | ICD-10-CM | POA: Diagnosis not present

## 2021-08-16 LAB — CBC
HCT: 41.9 % (ref 39.0–52.0)
Hemoglobin: 14.1 g/dL (ref 13.0–17.0)
MCH: 29.7 pg (ref 26.0–34.0)
MCHC: 33.7 g/dL (ref 30.0–36.0)
MCV: 88.2 fL (ref 80.0–100.0)
Platelets: 318 10*3/uL (ref 150–400)
RBC: 4.75 MIL/uL (ref 4.22–5.81)
RDW: 14.6 % (ref 11.5–15.5)
WBC: 11 10*3/uL — ABNORMAL HIGH (ref 4.0–10.5)
nRBC: 0 % (ref 0.0–0.2)

## 2021-08-16 MED ORDER — GUAIFENESIN ER 600 MG PO TB12
600.0000 mg | ORAL_TABLET | Freq: Two times a day (BID) | ORAL | Status: DC
  Administered 2021-08-16 (×2): 600 mg via ORAL
  Filled 2021-08-16 (×2): qty 1

## 2021-08-16 MED ORDER — IPRATROPIUM-ALBUTEROL 20-100 MCG/ACT IN AERS
1.0000 | INHALATION_SPRAY | Freq: Three times a day (TID) | RESPIRATORY_TRACT | Status: DC
  Administered 2021-08-16 – 2021-08-19 (×9): 1 via RESPIRATORY_TRACT
  Filled 2021-08-16: qty 4

## 2021-08-16 MED ORDER — SIMETHICONE 80 MG PO CHEW
160.0000 mg | CHEWABLE_TABLET | Freq: Once | ORAL | Status: AC
  Administered 2021-08-17: 160 mg via ORAL
  Filled 2021-08-16: qty 2

## 2021-08-16 NOTE — Progress Notes (Signed)
SATURATION QUALIFICATIONS: (This note is used to comply with regulatory documentation for home oxygen)  Patient Saturations on Room Air at Rest = 89%  Patient Saturations on Room Air while Ambulating = 86%  Patient Saturations on 2 Liters of oxygen while Ambulating = 91%  Please briefly explain why patient needs home oxygen: Unable to maintain O2 above 88 when ambulating.

## 2021-08-16 NOTE — Plan of Care (Signed)
  Problem: Activity: Goal: Ability to tolerate increased activity will improve Outcome: Progressing   Problem: Activity: Goal: Ability to tolerate increased activity will improve Outcome: Progressing   Problem: Respiratory: Goal: Levels of oxygenation will improve Outcome: Not Progressing

## 2021-08-17 ENCOUNTER — Inpatient Hospital Stay (HOSPITAL_COMMUNITY): Payer: No Typology Code available for payment source

## 2021-08-17 DIAGNOSIS — J189 Pneumonia, unspecified organism: Secondary | ICD-10-CM | POA: Diagnosis not present

## 2021-08-17 DIAGNOSIS — J9621 Acute and chronic respiratory failure with hypoxia: Secondary | ICD-10-CM | POA: Diagnosis not present

## 2021-08-17 DIAGNOSIS — J441 Chronic obstructive pulmonary disease with (acute) exacerbation: Secondary | ICD-10-CM | POA: Diagnosis not present

## 2021-08-17 DIAGNOSIS — I2510 Atherosclerotic heart disease of native coronary artery without angina pectoris: Secondary | ICD-10-CM | POA: Diagnosis not present

## 2021-08-17 DIAGNOSIS — K56600 Partial intestinal obstruction, unspecified as to cause: Secondary | ICD-10-CM

## 2021-08-17 HISTORY — DX: Partial intestinal obstruction, unspecified as to cause: K56.600

## 2021-08-17 LAB — RENAL FUNCTION PANEL
Albumin: 3.3 g/dL — ABNORMAL LOW (ref 3.5–5.0)
Anion gap: 22 — ABNORMAL HIGH (ref 5–15)
BUN: 67 mg/dL — ABNORMAL HIGH (ref 8–23)
CO2: 23 mmol/L (ref 22–32)
Calcium: 9.6 mg/dL (ref 8.9–10.3)
Chloride: 89 mmol/L — ABNORMAL LOW (ref 98–111)
Creatinine, Ser: 6.9 mg/dL — ABNORMAL HIGH (ref 0.61–1.24)
GFR, Estimated: 7 mL/min — ABNORMAL LOW (ref >60)
Glucose, Bld: 117 mg/dL — ABNORMAL HIGH (ref 70–99)
Phosphorus: 7 mg/dL — ABNORMAL HIGH (ref 2.5–4.6)
Potassium: 4.7 mmol/L (ref 3.5–5.1)
Sodium: 134 mmol/L — ABNORMAL LOW (ref 135–145)

## 2021-08-17 MED ORDER — PANTOPRAZOLE SODIUM 40 MG IV SOLR
40.0000 mg | Freq: Two times a day (BID) | INTRAVENOUS | Status: DC
  Administered 2021-08-17 – 2021-08-19 (×5): 40 mg via INTRAVENOUS
  Filled 2021-08-17 (×5): qty 10

## 2021-08-17 MED ORDER — HYDROMORPHONE HCL 1 MG/ML IJ SOLN
1.0000 mg | Freq: Once | INTRAMUSCULAR | Status: AC
  Administered 2021-08-17: 1 mg via INTRAVENOUS
  Filled 2021-08-17: qty 1

## 2021-08-17 MED ORDER — LORAZEPAM 2 MG/ML IJ SOLN
1.0000 mg | INTRAMUSCULAR | Status: AC
  Administered 2021-08-17: 1 mg via INTRAVENOUS
  Filled 2021-08-17: qty 1

## 2021-08-17 MED ORDER — METHYLPREDNISOLONE SODIUM SUCC 40 MG IJ SOLR
40.0000 mg | Freq: Every day | INTRAMUSCULAR | Status: AC
  Administered 2021-08-17 – 2021-08-18 (×2): 40 mg via INTRAVENOUS
  Filled 2021-08-17 (×2): qty 1

## 2021-08-17 MED ORDER — ONDANSETRON HCL 4 MG/2ML IJ SOLN
4.0000 mg | Freq: Four times a day (QID) | INTRAMUSCULAR | Status: DC | PRN
  Administered 2021-08-17 – 2021-08-18 (×3): 4 mg via INTRAVENOUS
  Filled 2021-08-17 (×3): qty 2

## 2021-08-17 MED ORDER — PROCHLORPERAZINE EDISYLATE 10 MG/2ML IJ SOLN
10.0000 mg | Freq: Four times a day (QID) | INTRAMUSCULAR | Status: DC | PRN
  Administered 2021-08-17 – 2021-08-18 (×2): 10 mg via INTRAVENOUS
  Filled 2021-08-17 (×2): qty 2

## 2021-08-17 MED ORDER — DIATRIZOATE MEGLUMINE & SODIUM 66-10 % PO SOLN
90.0000 mL | Freq: Once | ORAL | Status: AC
  Administered 2021-08-17: 90 mL via NASOGASTRIC
  Filled 2021-08-17: qty 90

## 2021-08-17 MED ORDER — HYDROMORPHONE HCL 1 MG/ML IJ SOLN
1.0000 mg | INTRAMUSCULAR | Status: DC | PRN
  Administered 2021-08-17 – 2021-08-20 (×11): 1 mg via INTRAVENOUS
  Filled 2021-08-17 (×12): qty 1

## 2021-08-17 MED ORDER — LACTATED RINGERS IV SOLN: INTRAVENOUS | Status: DC

## 2021-08-17 MED ORDER — CHLORHEXIDINE GLUCONATE CLOTH 2 % EX PADS: 6.0000 | MEDICATED_PAD | Freq: Every day | CUTANEOUS | Status: DC

## 2021-08-17 MED ORDER — SODIUM CHLORIDE 0.9 % IV SOLN
500.0000 mg | INTRAVENOUS | Status: DC
  Administered 2021-08-17: 500 mg via INTRAVENOUS
  Filled 2021-08-17: qty 5

## 2021-08-17 NOTE — Progress Notes (Signed)
  X-cover Note: Sent for CT abd/pelvis due to continue abd pain and distension. CT reviewed. Shows Small bowel obstruction with transition at the terminal ileum, likely a partial obstruction.  Pt already NPO. Start gentle IVF. Hold po meds if possible. Change to IV meds if possible.   Carollee Herter, DO [facility]

## 2021-08-18 ENCOUNTER — Inpatient Hospital Stay (HOSPITAL_COMMUNITY): Payer: No Typology Code available for payment source

## 2021-08-18 DIAGNOSIS — I2510 Atherosclerotic heart disease of native coronary artery without angina pectoris: Secondary | ICD-10-CM | POA: Diagnosis not present

## 2021-08-18 DIAGNOSIS — J441 Chronic obstructive pulmonary disease with (acute) exacerbation: Secondary | ICD-10-CM | POA: Diagnosis not present

## 2021-08-18 DIAGNOSIS — J189 Pneumonia, unspecified organism: Secondary | ICD-10-CM | POA: Diagnosis not present

## 2021-08-18 DIAGNOSIS — K56600 Partial intestinal obstruction, unspecified as to cause: Secondary | ICD-10-CM

## 2021-08-18 DIAGNOSIS — J9621 Acute and chronic respiratory failure with hypoxia: Secondary | ICD-10-CM | POA: Diagnosis not present

## 2021-08-18 LAB — CULTURE, BLOOD (ROUTINE X 2): Culture: NO GROWTH

## 2021-08-18 LAB — CBC
HCT: 48.9 % (ref 39.0–52.0)
Hemoglobin: 15.9 g/dL (ref 13.0–17.0)
MCH: 28.5 pg (ref 26.0–34.0)
MCHC: 32.5 g/dL (ref 30.0–36.0)
MCV: 87.6 fL (ref 80.0–100.0)
Platelets: 481 10*3/uL — ABNORMAL HIGH (ref 150–400)
RBC: 5.58 MIL/uL (ref 4.22–5.81)
RDW: 14.8 % (ref 11.5–15.5)
WBC: 15 10*3/uL — ABNORMAL HIGH (ref 4.0–10.5)
nRBC: 0 % (ref 0.0–0.2)

## 2021-08-18 LAB — RENAL FUNCTION PANEL
Albumin: 3.6 g/dL (ref 3.5–5.0)
Anion gap: 27 — ABNORMAL HIGH (ref 5–15)
BUN: 90 mg/dL — ABNORMAL HIGH (ref 8–23)
CO2: 20 mmol/L — ABNORMAL LOW (ref 22–32)
Calcium: 9.8 mg/dL (ref 8.9–10.3)
Chloride: 89 mmol/L — ABNORMAL LOW (ref 98–111)
Creatinine, Ser: 9.15 mg/dL — ABNORMAL HIGH (ref 0.61–1.24)
GFR, Estimated: 5 mL/min — ABNORMAL LOW (ref >60)
Glucose, Bld: 145 mg/dL — ABNORMAL HIGH (ref 70–99)
Phosphorus: 9.9 mg/dL — ABNORMAL HIGH (ref 2.5–4.6)
Potassium: 6 mmol/L — ABNORMAL HIGH (ref 3.5–5.1)
Sodium: 136 mmol/L (ref 135–145)

## 2021-08-18 MED ORDER — ALTEPLASE 2 MG IJ SOLR: 2.0000 mg | Freq: Once | INTRAMUSCULAR | Status: DC | PRN

## 2021-08-18 MED ORDER — LEVOTHYROXINE SODIUM 50 MCG PO TABS
50.0000 ug | ORAL_TABLET | Freq: Every day | ORAL | Status: DC
  Filled 2021-08-18: qty 1

## 2021-08-18 MED ORDER — HEPARIN SODIUM (PORCINE) 1000 UNIT/ML DIALYSIS: 1000.0000 [IU] | INTRAMUSCULAR | Status: DC | PRN

## 2021-08-18 MED ORDER — CLONIDINE HCL 0.1 MG PO TABS
0.1000 mg | ORAL_TABLET | Freq: Every day | ORAL | Status: DC
  Filled 2021-08-18: qty 1

## 2021-08-18 MED ORDER — SODIUM CHLORIDE 0.9 % IV SOLN: INTRAVENOUS | Status: AC

## 2021-08-18 MED ORDER — PRAVASTATIN SODIUM 40 MG PO TABS
40.0000 mg | ORAL_TABLET | Freq: Every day | ORAL | Status: DC
Start: 2021-08-18 — End: 2021-08-18
  Filled 2021-08-18: qty 1

## 2021-08-18 MED ORDER — ANTICOAGULANT SODIUM CITRATE 4% (200MG/5ML) IV SOLN
5.0000 mL | Status: DC | PRN
Start: 2021-08-18 — End: 2021-08-18
  Filled 2021-08-18: qty 5

## 2021-08-18 MED ORDER — LORAZEPAM 2 MG/ML IJ SOLN
2.0000 mg | INTRAMUSCULAR | Status: AC
  Administered 2021-08-18: 2 mg via INTRAVENOUS
  Filled 2021-08-18: qty 1

## 2021-08-18 MED ORDER — LEVOTHYROXINE SODIUM 100 MCG/5ML IV SOLN: 37.5000 ug | Freq: Every day | INTRAVENOUS | Status: DC

## 2021-08-18 MED ORDER — PENTAFLUOROPROP-TETRAFLUOROETH EX AERO: 1.0000 | INHALATION_SPRAY | CUTANEOUS | Status: DC | PRN

## 2021-08-18 MED ORDER — MELATONIN 5 MG PO TABS
5.0000 mg | ORAL_TABLET | Freq: Every day | ORAL | Status: DC
  Filled 2021-08-18: qty 1

## 2021-08-18 MED ORDER — HYDRALAZINE HCL 20 MG/ML IJ SOLN: 10.0000 mg | Freq: Four times a day (QID) | INTRAMUSCULAR | Status: DC | PRN

## 2021-08-18 MED ORDER — ACETAMINOPHEN 325 MG PO TABS: 650.0000 mg | ORAL_TABLET | Freq: Four times a day (QID) | ORAL | Status: DC | PRN

## 2021-08-18 MED ORDER — LIDOCAINE VISCOUS HCL 2 % MT SOLN
5.0000 mL | Freq: Once | OROMUCOSAL | Status: DC
  Filled 2021-08-18: qty 15

## 2021-08-18 MED ORDER — LIDOCAINE HCL (PF) 1 % IJ SOLN
5.0000 mL | INTRAMUSCULAR | Status: DC | PRN
Start: 2021-08-18 — End: 2021-08-18

## 2021-08-18 MED ORDER — HEPARIN SODIUM (PORCINE) 1000 UNIT/ML DIALYSIS
3000.0000 [IU] | INTRAMUSCULAR | Status: DC | PRN
  Administered 2021-08-18: 3000 [IU] via INTRAVENOUS_CENTRAL
  Filled 2021-08-18: qty 3

## 2021-08-18 MED ORDER — LIDOCAINE-PRILOCAINE 2.5-2.5 % EX CREA: 1.0000 | TOPICAL_CREAM | CUTANEOUS | Status: DC | PRN

## 2021-08-18 MED ORDER — LANTHANUM CARBONATE 500 MG PO CHEW
1000.0000 mg | CHEWABLE_TABLET | Freq: Three times a day (TID) | ORAL | Status: DC
  Filled 2021-08-18: qty 2

## 2021-08-18 MED ORDER — BUSPIRONE HCL 5 MG PO TABS
5.0000 mg | ORAL_TABLET | Freq: Two times a day (BID) | ORAL | Status: DC
Start: 2021-08-18 — End: 2021-08-18
  Filled 2021-08-18: qty 1

## 2021-08-18 NOTE — Progress Notes (Signed)
 [facility] Progress Note   Subjective: Seen on HD. Seems slightly lethargic but no C/Os at present. General surgery at bedside.   Objective Vitals:   08/18/21 0552 08/18/21 0754 08/18/21 0901 08/18/21 0903  BP: (!) 182/78 (!) 188/64 139/71   Pulse: (!) 109 (!) 108 (!) 104   Resp: 18 18 14    Temp: 97.8 F (36.6 C) 98 F (36.7 C) 97.9 F (36.6 C)   TempSrc: Oral Oral Axillary   SpO2: 90% 96% 96%   Weight:    70.5 kg  Height:       General elderly male in bed in no acute distress at rest normal work of breathing. On oxygen  via nasal cannula Heart S1S2 no rub Lungs: Decreased in bases, otherwise CTAB. Respirations shallow, no WOB.  Abdomen soft still tender to palpation. NG in place.  Extremities: No LE edema Psych normal mood and affect Neuro - alert and oriented x 3  AV Access:  RUE AVF cannulated at present.  Dialysis Orders:  Additional Objective Labs: Basic Metabolic Panel: Recent Labs  Lab 08/15/21 0310 08/17/21 0244 08/18/21 0333  NA 135 134* 136  K 4.8 4.7 6.0*  CL 94* 89* 89*  CO2 27 23 20*  GLUCOSE 177* 117* 145*  BUN 37* 67* 90*  CREATININE 4.20* 6.90* 9.15*  CALCIUM  9.3 9.6 9.8  PHOS 6.2* 7.0* 9.9*   Liver Function Tests: Recent Labs  Lab 08/13/21 1758 08/15/21 0310 08/17/21 0244 08/18/21 0333  AST 16  --   --   --   ALT 13  --   --   --   ALKPHOS 82  --   --   --   BILITOT 0.7  --   --   --   PROT 7.4  --   --   --   ALBUMIN  3.2* 3.0* 3.3* 3.6   No results for input(s): LIPASE, AMYLASE in the last 168 hours. CBC: Recent Labs  Lab 08/13/21 1758 08/14/21 0447 08/15/21 0310 08/16/21 0152 08/16/21 0542 08/18/21 0333  WBC 8.8 5.7 11.8* QUESTIONABLE RESULTS, RECOMMEND RECOLLECT TO VERIFY 11.0* 15.0*  NEUTROABS 5.8  --   --   --   --   --   HGB 13.5 13.0 12.9*  --  14.1 15.9  HCT 41.1 40.5 38.9*  --  41.9 48.9  MCV 90.9 92.7 88.4  --  88.2 87.6  PLT 283 300 306  --  318 481*   Blood Culture    Component Value  Date/Time   SDES BLOOD LEFT FOREARM 08/14/2021 0449   SPECREQUEST  08/14/2021 0449    BOTTLES DRAWN AEROBIC AND ANAEROBIC Blood Culture results may not be optimal due to an inadequate volume of blood received in culture bottles   CULT  08/14/2021 0449    CULTURE REINCUBATED FOR BETTER GROWTH Performed at [facility], [address], [place], [place] [address]    REPTSTATUS PENDING 08/14/2021 [phone]    Cardiac Enzymes: No results for input(s): CKTOTAL, CKMB, CKMBINDEX, TROPONINI in the last 168 hours. CBG: No results for input(s): GLUCAP in the last 168 hours. Iron Studies: No results for input(s): IRON, TIBC, TRANSFERRIN, FERRITIN in the last 72 hours. @lablastinr3 @ Studies/Results: DG Abd 1 View  Result Date: 08/18/2021 CLINICAL DATA:  Enteric tube placement. EXAM: ABDOMEN - 1 VIEW COMPARISON:  Abdominal x-ray from same day at 0539 hours. FLUOROSCOPY TIME:  Radiation Exposure Index (as provided by the fluoroscopic device): 4.0 mGy Kerma FINDINGS: New enteric tube  within the stomach. Unchanged small bowel dilatation. Similar just metallic density material throughout the visualized small bowel and colon. IMPRESSION: 1. New enteric tube within the stomach. Electronically Signed   By: Elsie ONEIDA Shoulder M.D.   On: 08/18/2021 08:53   DG Abd 1 View  Result Date: 08/18/2021 CLINICAL DATA:  Worsening abdominal distention. EXAM: ABDOMEN - 1 VIEW COMPARISON:  Abdominal x-ray and CT abdomen pelvis from yesterday. FINDINGS: Diffuse small bowel dilatation is not significantly changed. Similar ingested metallic density material throughout the small bowel and colon. IMPRESSION: 1. Unchanged small bowel obstruction. Electronically Signed   By: Elsie ONEIDA Shoulder M.D.   On: 08/18/2021 08:03   DG CHEST PORT 1 VIEW  Result Date: 08/18/2021 CLINICAL DATA:  Shortness of breath. EXAM: PORTABLE CHEST 1 VIEW COMPARISON:  CT chest dated August 15, 2021. Chest x-ray dated August 13, 2021. FINDINGS:  The patient is rotated to the right. Interval removal of the tunneled right internal jugular dialysis catheter. The heart size and mediastinal contours are within normal limits. Normal pulmonary vascularity. Unchanged minimal bibasilar atelectasis/scarring. No focal consolidation, pleural effusion, or pneumothorax. No acute osseous abnormality. Old right sixth rib fracture again noted. IMPRESSION: 1. No active disease. Electronically Signed   By: Elsie ONEIDA Shoulder M.D.   On: 08/18/2021 08:00   DG Abd Portable 1V-Small Bowel Obstruction Protocol-initial, 8 hr delay  Result Date: 08/17/2021 CLINICAL DATA:  Small-bowel obstruction EXAM: PORTABLE ABDOMEN - 1 VIEW COMPARISON:  CT from earlier in the same day. FINDINGS: Scattered large and small bowel gas is noted. Persistent small bowel dilatation is seen. No free air is noted. Scattered contrast material is noted throughout the large and small bowel related to prior CT examination. No definitive passage of small bowel contrast into the colon is noted at this time. Postsurgical changes in the lumbar spine and right hip are noted. IMPRESSION: Persistent small bowel dilatation. No passage of contrast material from the small bowel into the colon is noted at this time. Continued follow-up is recommended. Electronically Signed   By: Oneil Devonshire M.D.   On: 08/17/2021 20:22   CT ABDOMEN PELVIS WO CONTRAST  Result Date: 08/17/2021 CLINICAL DATA:  Abdominal distension and pain, last bowel movement yesterday. EXAM: CT ABDOMEN AND PELVIS WITHOUT CONTRAST TECHNIQUE: Multidetector CT imaging of the abdomen and pelvis was performed following the standard protocol without IV contrast. RADIATION DOSE REDUCTION: This exam was performed according to the departmental dose-optimization program which includes automated exposure control, adjustment of the mA and/or kV according to patient size and/or use of iterative reconstruction technique. COMPARISON:  05/01/2021 FINDINGS: Lower  chest: Normal heart size. Chronic small pericardial effusion. Extensive atheromatous calcification. A left pleural effusion is essentially resolved. Pleural thickening and subpleural opacification at the lung bases. Hepatobiliary: Soft tissue density mass in the right lobe liver measuring 2.1 cm, reference description June 2022 abdominal CT with contrast. No worrisome growth since prior.Cholecystectomy without bile duct dilatation. Pancreas: Coarse calcifications in the pancreas with ductal dilatation at the tail associated with a central stone, unchanged. No evidence of acute pancreatitis. Spleen: Unremarkable. Adrenals/Urinary Tract: Negative adrenals. Small kidneys with bilateral renal cystic densities. Bladder is largely obscured by streak artifact. Stomach/Bowel: Dilated small bowel loops with air contrast levels measuring up to 4.7 cm in diameter. A transition point is seen in the right lower quadrant at the level of the terminal ileum where there could be some small bowel wall thickening. Stool moderately distends the proximal colon. The distal colon is decompressed although  containing high-density material that outlines innumerable colonic diverticula. As before, there is ingested metallic density material throughout the lumen of the bowel. No appendicitis. Vascular/Lymphatic: No acute vascular abnormality. Confluent atheromatous calcification of the aorta. No mass or adenopathy. Reproductive:No acute finding Other: No ascites or pneumoperitoneum. Musculoskeletal: Solid fusion from L1-S1. Kyphotic deformity due to collapse and wedging at L1-2. Right hip replacement. Generalized osteopenia. IMPRESSION: 1. Small bowel obstruction with transition at the terminal ileum, likely a partial obstruction. 2. Decreased in colonic stool burden since March 2023. 3. Chronic calcific pancreatitis and renal atrophy. Electronically Signed   By: Dorn Roulette M.D.   On: 08/17/2021 04:44   DG Abd 1 View  Result Date:  08/16/2021 CLINICAL DATA:  Abdominal pain EXAM: ABDOMEN - 1 VIEW COMPARISON:  None Available. FINDINGS: There is diffuse mild gaseous distension of the colon extending into the rectal vault suggesting a colonic ileus. There is high density material seen throughout the abdomen in keeping with residual flocculated barium contrast. No free intraperitoneal gas. Underpenetration precludes evaluation of the visceral shadows. Lumbar fusion with instrumentation has been performed. Right total hip arthroplasty has been performed. IMPRESSION: Mild colonic ileus. No free intraperitoneal gas. Electronically Signed   By: Dorethia Molt M.D.   On: 08/16/2021 22:59   Medications:  anticoagulant sodium citrate       aspirin  EC  81 mg Oral Daily   busPIRone   5 mg Oral BID   Chlorhexidine  Gluconate Cloth  6 each Topical Q0600   cloNIDine   0.1 mg Oral Daily   diltiazem   180 mg Oral Daily   heparin   5,000 Units Subcutaneous Q8H   Ipratropium-Albuterol   1 puff Inhalation TID   lanthanum   1,000 mg Oral TID WC   levothyroxine   50 mcg Oral Q0600   lidocaine   5 mL Mouth/Throat Once   melatonin  5 mg Oral QHS   methylPREDNISolone  (SOLU-MEDROL ) injection  40 mg Intravenous Daily   pantoprazole  (PROTONIX ) IV  40 mg Intravenous Q12H   pravastatin   40 mg Oral QHS   tamsulosin   0.4 mg Oral Daily     Dialysis Orders: [facility] MWF 4 hrs 180NRe 300/500 76 kg 3.0K/2.5 Ca TDC/AVF -Heparin  3000 units IV TIW -Venofer  100 mg IV X 10 (2/10 doses have been given) DC this order.      Assessment/Plan:  CAP/Acute hypoxic respiratory failure-ABX per primary  Pulmonary edema/Volume overload-pulmonary edema noted on CXR and by exam.  Serial dialysis over 3 days last week with net UF 7L.   Edema is improved.  CT angio with no significant airspace disease.  SBO - KUB w/mild colonic ileus, CT abd w/ likely partial SBO. Now NPO. Per PMD. General surgery consulted.   COPD exacerbation-per primary  Pleuritic Pain- pain between shoulder  blades worse with inspiration.  CTA negative for PE. W/U per primary  ESRD -  MWF. Serial HD on [facility] this week.  Next HD on 6/26 unless acute indication prior.  TDC removed by IR 08/15/21 appreciate assistance.   Hypertension/volume  - HR 110. Has been NPO and rec'd HD 3 days last week. He seems dry. Will start IVF @ 50cc/Hr. Give NS 500 now and run even.   Anemia  - HGB 15.9. Does not need Fe load or ESA. Follow HGB.   Metabolic bone disease -  Corrected Ca+ 10 PO4 7. Resume lanthanum  binders when eating.   Nutrition - Albumin  low. Add protein supps. Currently NPO.  H/O CVA H/O CVA H/O PAF not on [facility]  Setsuko Robins H. Xaria Judon NP-C 08/18/2021, 9:34 AM  [employer] [phone]

## 2021-08-18 NOTE — Progress Notes (Signed)
Given 500 cc of NS per MD goal reduced RN notified

## 2021-08-18 NOTE — Progress Notes (Signed)
Machine alarmed Tech notice hematoma on venous site blood returned LPN and CN notified

## 2021-08-19 ENCOUNTER — Ambulatory Visit: Payer: Medicare Other | Admitting: Vascular Surgery

## 2021-08-19 DIAGNOSIS — J441 Chronic obstructive pulmonary disease with (acute) exacerbation: Secondary | ICD-10-CM | POA: Diagnosis not present

## 2021-08-19 DIAGNOSIS — J189 Pneumonia, unspecified organism: Secondary | ICD-10-CM | POA: Diagnosis not present

## 2021-08-19 DIAGNOSIS — I2510 Atherosclerotic heart disease of native coronary artery without angina pectoris: Secondary | ICD-10-CM | POA: Diagnosis not present

## 2021-08-19 DIAGNOSIS — J9621 Acute and chronic respiratory failure with hypoxia: Secondary | ICD-10-CM | POA: Diagnosis not present

## 2021-08-19 LAB — CBC
HCT: 39.1 % (ref 39.0–52.0)
Hemoglobin: 12.6 g/dL — ABNORMAL LOW (ref 13.0–17.0)
MCH: 28.8 pg (ref 26.0–34.0)
MCHC: 32.2 g/dL (ref 30.0–36.0)
MCV: 89.5 fL (ref 80.0–100.0)
Platelets: 297 10*3/uL (ref 150–400)
RBC: 4.37 MIL/uL (ref 4.22–5.81)
RDW: 14.7 % (ref 11.5–15.5)
WBC: 9.6 10*3/uL (ref 4.0–10.5)
nRBC: 0 % (ref 0.0–0.2)

## 2021-08-19 LAB — CULTURE, BLOOD (ROUTINE X 2)

## 2021-08-19 LAB — RENAL FUNCTION PANEL
Albumin: 2.8 g/dL — ABNORMAL LOW (ref 3.5–5.0)
Anion gap: 19 — ABNORMAL HIGH (ref 5–15)
BUN: 59 mg/dL — ABNORMAL HIGH (ref 8–23)
CO2: 23 mmol/L (ref 22–32)
Calcium: 8.9 mg/dL (ref 8.9–10.3)
Chloride: 97 mmol/L — ABNORMAL LOW (ref 98–111)
Creatinine, Ser: 6.31 mg/dL — ABNORMAL HIGH (ref 0.61–1.24)
GFR, Estimated: 8 mL/min — ABNORMAL LOW (ref >60)
Glucose, Bld: 95 mg/dL (ref 70–99)
Phosphorus: 9.9 mg/dL — ABNORMAL HIGH (ref 2.5–4.6)
Potassium: 5.2 mmol/L — ABNORMAL HIGH (ref 3.5–5.1)
Sodium: 139 mmol/L (ref 135–145)

## 2021-08-19 MED ORDER — MELATONIN 5 MG PO TABS
5.0000 mg | ORAL_TABLET | Freq: Every day | ORAL | Status: DC
  Administered 2021-08-19: 5 mg via ORAL
  Filled 2021-08-19: qty 1

## 2021-08-19 MED ORDER — FERROUS SULFATE 325 (65 FE) MG PO TABS: 325.0000 mg | ORAL_TABLET | ORAL | Status: DC

## 2021-08-19 MED ORDER — CHLORHEXIDINE GLUCONATE CLOTH 2 % EX PADS
6.0000 | MEDICATED_PAD | Freq: Every day | CUTANEOUS | Status: DC
  Administered 2021-08-20: 6 via TOPICAL

## 2021-08-19 MED ORDER — CLONIDINE HCL 0.1 MG PO TABS
0.1000 mg | ORAL_TABLET | Freq: Every day | ORAL | Status: DC
  Administered 2021-08-19 – 2021-08-20 (×2): 0.1 mg via ORAL
  Filled 2021-08-19 (×2): qty 1

## 2021-08-19 MED ORDER — LEVOTHYROXINE SODIUM 50 MCG PO TABS
50.0000 ug | ORAL_TABLET | Freq: Every day | ORAL | Status: DC
  Administered 2021-08-20: 50 ug via ORAL
  Filled 2021-08-19: qty 1

## 2021-08-19 MED ORDER — BUSPIRONE HCL 5 MG PO TABS
5.0000 mg | ORAL_TABLET | Freq: Two times a day (BID) | ORAL | Status: DC
  Administered 2021-08-19 – 2021-08-20 (×2): 5 mg via ORAL
  Filled 2021-08-19 (×2): qty 1

## 2021-08-19 MED ORDER — MUPIROCIN 2 % EX OINT
1.0000 | TOPICAL_OINTMENT | Freq: Two times a day (BID) | CUTANEOUS | Status: DC
  Administered 2021-08-19 – 2021-08-20 (×3): 1 via NASAL
  Filled 2021-08-19: qty 22

## 2021-08-19 MED ORDER — DILTIAZEM HCL ER COATED BEADS 180 MG PO CP24
180.0000 mg | ORAL_CAPSULE | Freq: Every day | ORAL | Status: DC
  Administered 2021-08-19 – 2021-08-20 (×2): 180 mg via ORAL
  Filled 2021-08-19 (×2): qty 1

## 2021-08-19 MED ORDER — DOCUSATE SODIUM 100 MG PO CAPS
100.0000 mg | ORAL_CAPSULE | Freq: Two times a day (BID) | ORAL | Status: DC
  Administered 2021-08-20: 100 mg via ORAL
  Filled 2021-08-19: qty 1

## 2021-08-19 MED ORDER — PRAVASTATIN SODIUM 40 MG PO TABS: 40.0000 mg | ORAL_TABLET | Freq: Every day | ORAL | Status: DC

## 2021-08-19 MED ORDER — PANTOPRAZOLE SODIUM 40 MG PO TBEC
40.0000 mg | DELAYED_RELEASE_TABLET | Freq: Two times a day (BID) | ORAL | Status: DC
  Administered 2021-08-19: 40 mg via ORAL
  Filled 2021-08-19: qty 1

## 2021-08-19 MED ORDER — TAMSULOSIN HCL 0.4 MG PO CAPS
0.4000 mg | ORAL_CAPSULE | Freq: Every day | ORAL | Status: DC
  Administered 2021-08-20: 0.4 mg via ORAL
  Filled 2021-08-19: qty 1

## 2021-08-19 MED ORDER — ORAL CARE MOUTH RINSE: 15.0000 mL | OROMUCOSAL | Status: DC | PRN

## 2021-08-19 MED ORDER — PHENOL 1.4 % MT LIQD
1.0000 | OROMUCOSAL | Status: DC | PRN
Start: 2021-08-19 — End: 2021-08-20
  Administered 2021-08-19: 1 via OROMUCOSAL
  Filled 2021-08-19: qty 177

## 2021-08-19 NOTE — Progress Notes (Signed)
NG tube removed, pt tolerated good.

## 2021-08-19 NOTE — Progress Notes (Signed)
[facility] Progress Note   Subjective: Up in chair, looks much better than yesterday. NG clamped, can hear bowel sounds LQ of abdomen. AVF infiltrated 08/18/2021. Very bruised and tender. + T/B  Objective Vitals:   08/18/21 2201 08/19/21 0431 08/19/21 0500 08/19/21 0908  BP: (!) 168/60 (!) 164/67  (!) 161/67  Pulse: 98 96  94  Resp: 18 20  17   Temp: 97.9 F (36.6 C) 98.1 F (36.7 C)  98 F (36.7 C)  TempSrc: Oral Oral  Oral  SpO2: 96% 96%  99%  Weight:   68.8 kg   Height:       General elderly male in NAD Heart S1S2 no M/R/G Lungs: Decreased in bases, otherwise CTAB. Respirations shallow, no WOB.  Abdomen NT, hypoactive  NG in place-clamped Extremities: No LE edema Psych normal mood and affect Neuro - alert and oriented x 3  AV Access:  RUE AVF bruised, tender but +T/B   Additional Objective Labs: Basic Metabolic Panel: Recent Labs  Lab 08/17/21 0244 08/18/21 0333 08/19/21 0532  NA 134* 136 139  K 4.7 6.0* 5.2*  CL 89* 89* 97*  CO2 23 20* 23  GLUCOSE 117* 145* 95  BUN 67* 90* 59*  CREATININE 6.90* 9.15* 6.31*  CALCIUM 9.6 9.8 8.9  PHOS 7.0* 9.9* 9.9*   Liver Function Tests: Recent Labs  Lab 08/13/21 1758 08/15/21 0310 08/17/21 0244 08/18/21 0333 08/19/21 0532  AST 16  --   --   --   --   ALT 13  --   --   --   --   ALKPHOS 82  --   --   --   --   BILITOT 0.7  --   --   --   --   PROT 7.4  --   --   --   --   ALBUMIN 3.2*   < > 3.3* 3.6 2.8*   < > = values in this interval not displayed.   No results for input(s): "LIPASE", "AMYLASE" in the last 168 hours. CBC: Recent Labs  Lab 08/13/21 1758 08/14/21 0447 08/15/21 0310 08/16/21 0152 08/16/21 0542 08/18/21 0333 08/19/21 0532  WBC 8.8 5.7 11.8*   < > 11.0* 15.0* 9.6  NEUTROABS 5.8  --   --   --   --   --   --   HGB 13.5 13.0 12.9*  --  14.1 15.9 12.6*  HCT 41.1 40.5 38.9*  --  41.9 48.9 39.1  MCV 90.9 92.7 88.4  --  88.2 87.6 89.5  PLT 283 300 306  --  318 481* 297   < >  = values in this interval not displayed.   Blood Culture    Component Value Date/Time   SDES BLOOD LEFT FOREARM 08/14/2021 0449   SPECREQUEST  08/14/2021 0449    BOTTLES DRAWN AEROBIC AND ANAEROBIC Blood Culture results may not be optimal due to an inadequate volume of blood received in culture bottles   CULT (A) 08/14/2021 0449    DIPHTHEROIDS(CORYNEBACTERIUM SPECIES) Standardized susceptibility testing for this organism is not available. Performed at [facility], [address], [place], [place] [address]    REPTSTATUS 08/19/2021 FINAL 08/14/2021 0449    Cardiac Enzymes: No results for input(s): "CKTOTAL", "CKMB", "CKMBINDEX", "TROPONINI" in the last 168 hours. CBG: No results for input(s): "GLUCAP" in the last 168 hours. Iron Studies: No results for input(s): "IRON", "TIBC", "TRANSFERRIN", "FERRITIN" in the last 72 hours. @lablastinr3 @ Studies/Results: DG  Abd 1 View  Result Date: 08/18/2021 CLINICAL DATA:  Enteric tube placement. EXAM: ABDOMEN - 1 VIEW COMPARISON:  Abdominal x-ray from same day at 0539 hours. FLUOROSCOPY TIME:  Radiation Exposure Index (as provided by the fluoroscopic device): 4.0 mGy Kerma FINDINGS: New enteric tube within the stomach. Unchanged small bowel dilatation. Similar just metallic density material throughout the visualized small bowel and colon. IMPRESSION: 1. New enteric tube within the stomach. Electronically Signed   By: Obie Dredge M.D.   On: 08/18/2021 08:53   DG Abd 1 View  Result Date: 08/18/2021 CLINICAL DATA:  Worsening abdominal distention. EXAM: ABDOMEN - 1 VIEW COMPARISON:  Abdominal x-ray and CT abdomen pelvis from yesterday. FINDINGS: Diffuse small bowel dilatation is not significantly changed. Similar ingested metallic density material throughout the small bowel and colon. IMPRESSION: 1. Unchanged small bowel obstruction. Electronically Signed   By: Obie Dredge M.D.   On: 08/18/2021 08:03   DG CHEST PORT 1 VIEW  Result  Date: 08/18/2021 CLINICAL DATA:  Shortness of breath. EXAM: PORTABLE CHEST 1 VIEW COMPARISON:  CT chest dated August 15, 2021. Chest x-ray dated August 13, 2021. FINDINGS: The patient is rotated to the right. Interval removal of the tunneled right internal jugular dialysis catheter. The heart size and mediastinal contours are within normal limits. Normal pulmonary vascularity. Unchanged minimal bibasilar atelectasis/scarring. No focal consolidation, pleural effusion, or pneumothorax. No acute osseous abnormality. Old right sixth rib fracture again noted. IMPRESSION: 1. No active disease. Electronically Signed   By: Obie Dredge M.D.   On: 08/18/2021 08:00   DG Abd Portable 1V-Small Bowel Obstruction Protocol-initial, 8 hr delay  Result Date: 08/17/2021 CLINICAL DATA:  Small-bowel obstruction EXAM: PORTABLE ABDOMEN - 1 VIEW COMPARISON:  CT from earlier in the same day. FINDINGS: Scattered large and small bowel gas is noted. Persistent small bowel dilatation is seen. No free air is noted. Scattered contrast material is noted throughout the large and small bowel related to prior CT examination. No definitive passage of small bowel contrast into the colon is noted at this time. Postsurgical changes in the lumbar spine and right hip are noted. IMPRESSION: Persistent small bowel dilatation. No passage of contrast material from the small bowel into the colon is noted at this time. Continued follow-up is recommended. Electronically Signed   By: Alcide Clever M.D.   On: 08/17/2021 20:22   Medications:   Chlorhexidine Gluconate Cloth  6 each Topical Q0600   heparin  5,000 Units Subcutaneous Q8H   Ipratropium-Albuterol  1 puff Inhalation TID   [START ON 08/25/2021] levothyroxine  37.5 mcg Intravenous Daily   lidocaine  5 mL Mouth/Throat Once   mupirocin ointment  1 Application Nasal BID   pantoprazole (PROTONIX) IV  40 mg Intravenous Q12H     Dialysis Orders: [facility] MWF 4 hrs 180NRe 300/500 76 kg 3.0K/2.5 Ca  TDC/AVF -Heparin 3000 units IV TIW -Venofer 100 mg IV X 10 (2/10 doses have been given) DC this order.      Assessment/Plan:  CAP/Acute hypoxic respiratory failure-ABX per primary  Pulmonary edema/Volume overload-pulmonary edema noted on CXR and by exam.  Serial dialysis over 3 days last week with net UF 7L.   Edema is improved.  CT angio with no significant airspace disease.  SBO - KUB w/mild colonic ileus, CT abd w/ likely partial SBO. Now NPO. Per PMD. General surgery consulted. Resolving.   COPD exacerbation-per primary  Pleuritic Pain- pain between shoulder blades worse with inspiration.  CTA negative for PE.  W/U per primary  ESRD -  MWF. Next HD 08/20/2021. TDC removed by IR 08/15/21 appreciate assistance.   Hypertension/volume  - Was given fluid in HD yesterday. Rec'd at least 1 liter and had NS overnight. Looks better. UF as tolerated. Lower EDW on discharge.   Anemia  - HGB 12.6. Does not need Fe load or ESA. Follow HGB.   Metabolic bone disease -  Corrected Ca+ 10 PO4 7. Resume lanthanum binders when eating.   Nutrition - Albumin low. Add protein supps. Currently NPO.  H/O CVA H/O CVA H/O PAF not on AC  Nycole Kawahara H. Layliana Devins NP-C 08/19/2021, 11:45 AM  [employer] [employer] [phone]

## 2021-08-20 ENCOUNTER — Inpatient Hospital Stay (HOSPITAL_COMMUNITY): Payer: No Typology Code available for payment source

## 2021-08-20 DIAGNOSIS — J9621 Acute and chronic respiratory failure with hypoxia: Secondary | ICD-10-CM | POA: Diagnosis not present

## 2021-08-20 DIAGNOSIS — J9601 Acute respiratory failure with hypoxia: Secondary | ICD-10-CM | POA: Diagnosis not present

## 2021-08-20 DIAGNOSIS — J189 Pneumonia, unspecified organism: Secondary | ICD-10-CM | POA: Diagnosis not present

## 2021-08-20 LAB — CBC
HCT: 38.7 % — ABNORMAL LOW (ref 39.0–52.0)
Hemoglobin: 12.2 g/dL — ABNORMAL LOW (ref 13.0–17.0)
MCH: 28.8 pg (ref 26.0–34.0)
MCHC: 31.5 g/dL (ref 30.0–36.0)
MCV: 91.3 fL (ref 80.0–100.0)
Platelets: 251 10*3/uL (ref 150–400)
RBC: 4.24 MIL/uL (ref 4.22–5.81)
RDW: 14.7 % (ref 11.5–15.5)
WBC: 9.6 10*3/uL (ref 4.0–10.5)
nRBC: 0 % (ref 0.0–0.2)

## 2021-08-20 LAB — RENAL FUNCTION PANEL
Albumin: 2.7 g/dL — ABNORMAL LOW (ref 3.5–5.0)
Anion gap: 16 — ABNORMAL HIGH (ref 5–15)
BUN: 76 mg/dL — ABNORMAL HIGH (ref 8–23)
CO2: 26 mmol/L (ref 22–32)
Calcium: 8.7 mg/dL — ABNORMAL LOW (ref 8.9–10.3)
Chloride: 96 mmol/L — ABNORMAL LOW (ref 98–111)
Creatinine, Ser: 8.01 mg/dL — ABNORMAL HIGH (ref 0.61–1.24)
GFR, Estimated: 6 mL/min — ABNORMAL LOW (ref >60)
Glucose, Bld: 91 mg/dL (ref 70–99)
Phosphorus: 9.9 mg/dL — ABNORMAL HIGH (ref 2.5–4.6)
Potassium: 4.9 mmol/L (ref 3.5–5.1)
Sodium: 138 mmol/L (ref 135–145)

## 2021-08-20 MED ORDER — HEPARIN SODIUM (PORCINE) 1000 UNIT/ML DIALYSIS
20.0000 [IU]/kg | INTRAMUSCULAR | Status: DC | PRN
  Administered 2021-08-20: 1400 [IU] via INTRAVENOUS_CENTRAL

## 2021-08-20 MED ORDER — LIDOCAINE-PRILOCAINE 2.5-2.5 % EX CREA: 1.0000 | TOPICAL_CREAM | CUTANEOUS | Status: DC | PRN

## 2021-08-20 MED ORDER — IPRATROPIUM-ALBUTEROL 0.5-2.5 (3) MG/3ML IN SOLN
3.0000 mL | Freq: Four times a day (QID) | RESPIRATORY_TRACT | Status: DC | PRN
Start: 2021-08-20 — End: 2021-08-20
  Administered 2021-08-20: 3 mL via RESPIRATORY_TRACT

## 2021-08-20 MED ORDER — PENTAFLUOROPROP-TETRAFLUOROETH EX AERO: 1.0000 | INHALATION_SPRAY | CUTANEOUS | Status: DC | PRN

## 2021-08-20 MED ORDER — HEPARIN SODIUM (PORCINE) 1000 UNIT/ML IJ SOLN
1400.0000 [IU] | Freq: Once | INTRAMUSCULAR | Status: AC
  Filled 2021-08-20: qty 2

## 2021-08-20 MED ORDER — ALPRAZOLAM 0.25 MG PO TABS
0.2500 mg | ORAL_TABLET | ORAL | Status: AC | PRN
Start: 2021-08-20 — End: 2021-08-20
  Administered 2021-08-20: 0.25 mg via ORAL
  Filled 2021-08-20: qty 1

## 2021-08-20 NOTE — Plan of Care (Signed)
  Problem: Education: Goal: Knowledge of the prescribed therapeutic regimen will improve Outcome: Completed/Met Goal: Individualized Educational Video(s) Outcome: Completed/Met   Problem: Activity: Goal: Ability to tolerate increased activity will improve Outcome: Completed/Met Goal: Will verbalize the importance of balancing activity with adequate rest periods Outcome: Completed/Met   Problem: Respiratory: Goal: Ability to maintain a clear airway will improve Outcome: Completed/Met Goal: Levels of oxygenation will improve Outcome: Completed/Met Goal: Ability to maintain adequate ventilation will improve Outcome: Completed/Met   Problem: Activity: Goal: Ability to tolerate increased activity will improve Outcome: Completed/Met   Problem: Clinical Measurements: Goal: Ability to maintain a body temperature in the normal range will improve Outcome: Completed/Met   Problem: Respiratory: Goal: Ability to maintain adequate ventilation will improve Outcome: Completed/Met Goal: Ability to maintain a clear airway will improve Outcome: Completed/Met   Problem: Education: Goal: Knowledge of General Education information will improve Description: Including pain rating scale, medication(s)/side effects and non-pharmacologic comfort measures Outcome: Completed/Met   Problem: Health Behavior/Discharge Planning: Goal: Ability to manage health-related needs will improve Outcome: Completed/Met   Problem: Clinical Measurements: Goal: Ability to maintain clinical measurements within normal limits will improve Outcome: Completed/Met Goal: Will remain free from infection Outcome: Completed/Met Goal: Diagnostic test results will improve Outcome: Completed/Met Goal: Respiratory complications will improve Outcome: Completed/Met Goal: Cardiovascular complication will be avoided Outcome: Completed/Met   Problem: Activity: Goal: Risk for activity intolerance will decrease Outcome:  Completed/Met   Problem: Nutrition: Goal: Adequate nutrition will be maintained Outcome: Completed/Met   Problem: Coping: Goal: Level of anxiety will decrease Outcome: Completed/Met   Problem: Elimination: Goal: Will not experience complications related to bowel motility Outcome: Completed/Met Goal: Will not experience complications related to urinary retention Outcome: Completed/Met   Problem: Pain Managment: Goal: General experience of comfort will improve Outcome: Completed/Met   Problem: Safety: Goal: Ability to remain free from injury will improve Outcome: Completed/Met   Problem: Skin Integrity: Goal: Risk for impaired skin integrity will decrease Outcome: Completed/Met

## 2021-08-20 NOTE — TOC Transition Note (Addendum)
Transition of Care [facility]) - CM/SW Discharge Note   Patient Details  Name: Wayne Meadows MRN: [id number] Date of Birth: 12-Nov-1934  [facility] CM/SW Contact:  Milinda Antis, [place] Phone Number: 08/20/2021, 2:25 PM   Clinical Narrative:    Patient will DC to: [facility] Anticipated DC date:  08/20/2021 Family notified: Yes Transport by: Corey Harold   Per MD patient ready for DC to SNF. RN to call report prior to discharge [phone] room 712. RN, patient, patient's family, and facility notified of DC. Discharge Summary and FL2 sent to facility. DC packet on chart. Ambulance transport requested for patient.   CSW will sign off for now as social work intervention is no longer needed. Please consult [place] again if new needs arise.     Final next level of care: Skilled Nursing Facility Barriers to Discharge: No Barriers Identified   Patient Goals and CMS Choice        Discharge Placement              Patient chooses bed at:  ([facility]) Patient to be transferred to facility by: [facility] Name of family member notified: Lyna Poser (Daughter)   [phone] Patient and family notified of of transfer: 08/20/21  Discharge Plan and Services                                     Social Determinants of Health (SDOH) Interventions     Readmission Risk Interventions    03/21/2021    1:21 PM 03/19/2021    1:23 PM  Readmission Risk Prevention Plan  Transportation Screening Complete   HRI or Home Care Consult Complete   SW Recovery Care/Counseling Consult Complete Complete  Skilled Nursing Facility Complete Complete

## 2021-08-20 NOTE — Progress Notes (Signed)
[facility] Progress Note     Subjective: CC:  Denies any abd pain. Reports ongoing flatus and multiple BMs yesterday. Tolerating FLD.   Objective: Vital signs in last 24 hours: Temp:  [98 F (36.7 C)-98.1 F (36.7 C)] 98 F (36.7 C) (06/28 0423) Pulse Rate:  [76-96] 76 (06/28 0423) Resp:  [16-18] 16 (06/28 0423) BP: (123-171)/(49-67) 123/49 (06/28 0423) SpO2:  [90 %-99 %] 97 % (06/28 0423) Weight:  [70.1 kg] 70.1 kg (06/28 0423) Last BM Date : 08/18/21  Intake/Output from previous day: 06/27 0701 - 06/28 0700 In: 420 [P.O.:420] Out: -  Intake/Output this shift: No intake/output data recorded.  PE: Gen:  Alert, NAD, pleasant Card:  Regular rate and rhythm Pulm:  Normal effort, Abd: Soft, mild distention, significant diastasis, nontender Skin: warm and dry, no rashes  Psych: A&Ox3   Lab Results:  Recent Labs    08/19/21 0532 08/20/21 0401  WBC 9.6 9.6  HGB 12.6* 12.2*  HCT 39.1 38.7*  PLT 297 251   BMET Recent Labs    08/19/21 0532 08/20/21 0401  NA 139 138  K 5.2* 4.9  CL 97* 96*  CO2 23 26  GLUCOSE 95 91  BUN 59* 76*  CREATININE 6.31* 8.01*  CALCIUM 8.9 8.7*   PT/INR No results for input(s): "LABPROT", "INR" in the last 72 hours. CMP     Component Value Date/Time   NA 138 08/20/2021 0401   K 4.9 08/20/2021 0401   CL 96 (L) 08/20/2021 0401   CO2 26 08/20/2021 0401   GLUCOSE 91 08/20/2021 0401   BUN 76 (H) 08/20/2021 0401   CREATININE 8.01 (H) 08/20/2021 0401   CALCIUM 8.7 (L) 08/20/2021 0401   PROT 7.4 08/13/2021 1758   ALBUMIN 2.7 (L) 08/20/2021 0401   AST 16 08/13/2021 1758   ALT 13 08/13/2021 1758   ALKPHOS 82 08/13/2021 1758   BILITOT 0.7 08/13/2021 1758   GFRNONAA 6 (L) 08/20/2021 0401   GFRAA 52 (L) 07/31/2017 1630   Lipase     Component Value Date/Time   LIPASE 44 05/01/2021 0558       Studies/Results: DG Abd 1 View  Result Date: 08/18/2021 CLINICAL DATA:  Enteric tube placement. EXAM: ABDOMEN - 1 VIEW  COMPARISON:  Abdominal x-ray from same day at 0539 hours. FLUOROSCOPY TIME:  Radiation Exposure Index (as provided by the fluoroscopic device): 4.0 mGy Kerma FINDINGS: New enteric tube within the stomach. Unchanged small bowel dilatation. Similar just metallic density material throughout the visualized small bowel and colon. IMPRESSION: 1. New enteric tube within the stomach. Electronically Signed   By: Titus Dubin M.D.   On: 08/18/2021 08:53    Anti-infectives: Anti-infectives (From admission, onward)    Start     Dose/Rate Route Frequency Ordered Stop   08/17/21 0615  azithromycin (ZITHROMAX) 500 mg in sodium chloride 0.9 % 250 mL IVPB  Status:  Discontinued        500 mg 250 mL/hr over 60 Minutes Intravenous Every 24 hours 08/17/21 0518 08/17/21 1318   08/14/21 2030  cefTRIAXone (ROCEPHIN) 2 g in sodium chloride 0.9 % 100 mL IVPB        2 g 200 mL/hr over 30 Minutes Intravenous Every 24 hours 08/13/21 2157 08/18/21 0700   08/14/21 2030  azithromycin (ZITHROMAX) tablet 500 mg  Status:  Discontinued        500 mg Oral Daily 08/13/21 2157 08/17/21 0518   08/13/21 2030  cefTRIAXone (ROCEPHIN) 1 g in sodium  chloride 0.9 % 100 mL IVPB        1 g 200 mL/hr over 30 Minutes Intravenous  Once 08/13/21 2021 08/13/21 2156   08/13/21 2030  azithromycin (ZITHROMAX) 500 mg in sodium chloride 0.9 % 250 mL IVPB  Status:  Discontinued        500 mg 250 mL/hr over 60 Minutes Intravenous Every 24 hours 08/13/21 2021 08/13/21 2159        Assessment/Plan  pSBO vs ileus - hx of appendectomy and ex lap due to perforation after colonoscopy. - NG placed in fluoro 6/27, return of bowel function 6/28 >> NG removed. - advance to SOFT diet (I continued 1200 mL fluid restriction), his SBO v ileus is resolving. No acute surgical needs. CCS will sign off, please call as needed. - mobilize as able, avoid narcotics as able - replete electrolytes PRN with goal K > 4.0 and Mg > 2.0      FEN - SOFT,fluid  restriction per primary team VTE - SCD's, SQH  ID - Rocephin 6/21-25 for CAP Admit - on TR[facility] service    Acute on chronic respiratory failure, CAP COPD on home 3L home O2 Diastolic CHFpEF HTN CAD ESRD on HD [facility]     LOS: 7 days   I reviewed nursing notes, hospitalist notes, last 24 h vitals and pain scores, last 48 h intake and output, last 24 h labs and trends, and last 24 h imaging results.    Obie Dredge, PA-C [facility] Please see Amion for pager number during day hours 7:00am-4:30pm

## 2021-08-20 NOTE — Progress Notes (Signed)
Advised by CSW that pt will d/c today. Spoke to [place] at [facility] to make clinic aware of pt's d/c today and that pt will resume on Friday.   Melven Sartorius Renal Navigator [phone]

## 2021-08-20 NOTE — Hospital Course (Signed)
87 y.o. male with past medical history significant for renal cell carcinoma, emphysema, chronic hypoxic respiratory failure on 3 L nasal cannula, CAD, HTN, ESRD on HD MWF, hypothyroidism, GERD, depression/anxiety, anemia of chronic renal disease, iron deficiency anemia, documented history of paroxysmal atrial fibrillation not on anticoagulation who presents to [facility] ED via EMS from [facility] with complaints of shortness of breath, cough and pleuritic pain.  Patient reports he had been receiving treatment for pneumonia at his facility, but despite with persistent shortness of breath/cough and severe pleuritic pain near his right scapula.  Patient reports completing his hemodialysis session earlier in the day, but despite continued to be short of breath and was transported to the hospital for further evaluation.  EMS treated with Solu-Medrol and nebulized breathing treatments prior to arrival to the ED.  Patient reports that his weight has been stable with hemodialysis.   In the ED, temperature 99.1 F, HR 103, RR 18, BP 164/67, SPO2 88% on 3 L nasal cannula.  WBC 8.8, hemoglobin 13.5, platelets 283.  Sodium 139, potassium 4.1, chloride 99, CO2 26, glucose 96, BUN 20, creatinine 3.37.  AST 16, ALT 13, total bilirubin 0.7.  Lactic acid 1.2.  Chest x-ray with mild pulmonary edema, right base streaky airspace opacity consistent with infection versus inflammation, aortic atherosclerosis and emphysema.  Blood cultures x2 obtained.  Started on ceftriaxone and azithromycin.  TRH consulted for further evaluation and management of pneumonia and COPD exacerbation.

## 2021-08-20 NOTE — Discharge Summary (Signed)
Physician Discharge Summary   Patient: Wayne Meadows MRN: [id number] DOB: 01/27/1935  Admit date:     08/13/2021  Discharge date: 08/20/21  Discharge Physician: Marylu Lund   PCP: Rexene Agent, MD   Recommendations at discharge:    Follow up with PCP in 1-2 weeks Follow up with General Surgery as needed Recommend outpatient surveillance with repeat CT chest 12 months  Discharge Diagnoses: Principal Problem:   Right lower lobe pneumonia Active Problems:   CAD (coronary artery disease)   COPD with acute exacerbation (HCC)   Hypertension   ESRD (end stage renal disease) on dialysis ([place])   Acute on chronic respiratory failure with hypoxia (HCC)   Partial small bowel obstruction ([place])  Resolved Problems:   * No resolved hospital problems. *  Hospital Course: 87 y.o. male with past medical history significant for renal cell carcinoma, emphysema, chronic hypoxic respiratory failure on 3 L nasal cannula, CAD, HTN, ESRD on HD MWF, hypothyroidism, GERD, depression/anxiety, anemia of chronic renal disease, iron deficiency anemia, documented history of paroxysmal atrial fibrillation not on anticoagulation who presents to [facility] ED via EMS from [facility]F with complaints of shortness of breath, cough and pleuritic pain.  Patient reports he had been receiving treatment for pneumonia at his facility, but despite with persistent shortness of breath/cough and severe pleuritic pain near his right scapula.  Patient reports completing his hemodialysis session earlier in the day, but despite continued to be short of breath and was transported to the hospital for further evaluation.  EMS treated with Solu-Medrol and nebulized breathing treatments prior to arrival to the ED.  Patient reports that his weight has been stable with hemodialysis.   In the ED, temperature 99.1 F, HR 103, RR 18, BP 164/67, SPO2 88% on 3 L nasal cannula.  WBC 8.8, hemoglobin 13.5, platelets 283.  Sodium 139, potassium  4.1, chloride 99, CO2 26, glucose 96, BUN 20, creatinine 3.37.  AST 16, ALT 13, total bilirubin 0.7.  Lactic acid 1.2.  Chest x-ray with mild pulmonary edema, right base streaky airspace opacity consistent with infection versus inflammation, aortic atherosclerosis and emphysema.  Blood cultures x2 obtained.  Started on ceftriaxone and azithromycin.  TRH consulted for further evaluation and management of pneumonia and COPD exacerbation.  Assessment and Plan: Partial small bowel obstruction On the evening of 6/24, patient complaining of abdominal pain, distention.  KUB notable for colonic ileus.  Received simethicone, IV Dilaudid but symptoms persisted.  CT abdomen/pelvis with small bowel obstruction with transition point at the terminal ileum likely partial obstruction.  History of appendectomy in the 1960s and exlap for complication following reported endoscopic procedure at the [place] but unknown time. --General surgery had been following --Pt improved with supportive care. NG tube initially needed and later clamped and removed --diet was advanced per General Surgery to soft   Acute on chronic hypoxic respiratory failure Patient presenting to ED via EMS from SNF following dialysis which she was compliant.  Patient on 3 L nasal cannula at baseline; and on arrival was noted to be 88% on his 3 L which was increased to 6 L.  Etiology likely multifactorial given right base opacity on chest x-ray concerning for community-acquired pneumonia, COPD exacerbation and volume overload with pulmonary edema on chest x-ray. --Continue treatment as below --O2 was weaned to baseline 3LNC   Community-acquired pneumonia Chest x-ray on arrival with right base streaky airspace opacity consistent with infection.  Apparently patient was being treated for pneumonia outpatient with Augmentin.  Patient is afebrile without leukocytosis but elevated procalcitonin of 0.23.  Completed 5-day course of IV azithromycin and  ceftriaxone. --Blood Cx x2: 1/4 (aerobic bottle), + Diptheroids, likely contaminant; remaining Cx NG x 5 days   COPD exacerbation Patient reports progressive wheezing over the last few days.  At baseline utilizes Combivent inhaler 1 puff 4 times daily, albuterol and DuoNebs as needed.  Completed 5-day course of steroids. --Combivent 4 times daily --Albuterol neb every 2 hours as needed shortness of breath/wheezing --Mucinex 600 mg twice daily, hold while n.p.o. --Continue supplemental oxygen, maintain SPO2 >/= 88%; on 3 L nasal cannula at baseline   Acute on chronic diastolic congestive heart failure exacerbation Chest x-ray on admission with pulmonary edema.  TTE 03/08/2021 with LVEF 65 to 99%, grade 1 diastolic dysfunction, mild concentric LVH, biatrial enlargement, mild/moderate AR, IVC normal in size. --Continue volume management with HD   Pleuritic pain Patient reports pleuritic pain on deep inspiration to right scapular region.  EKG with sinus tachycardia and no concerning dynamic changes.  D-dimer elevated 2.01; although normal when adjusted for age.  CTA chest PE study 6/23 negative for pulmonary embolism, densities bilateral lung bases consistent with atelectasis, no concerning airspace disease/consolidation.   Pulmonary nodule Incidental finding on CTA chest of 4 mm pulmonary nodule right lung apex.  Recommend outpatient surveillance with repeat CT chest 12 months   Distal pancreatic body/tail lesion concerning for neoplasm Patient continues with abnormal appearance distal pancreatic body/tail with ductal dilation and calcification consistent with postinflammatory process versus neoplasm.  Initially noted on MRI 09/09/2020 with 2 cm masslike area of the pancreatic body at the site of pancreatic duct stricture measuring 2.0 x 1.9 cm suspicious for pancreatic mass.  Was initially referred to GI in which Dr. Ardis Hughs was planning to do an EUS, but unfortunately the [place] stated that he needed to  go to [facility] digestive health services as they were in his network. --Continue outpatient [facility]   Essential hypertension -remained stable -Cont home meds on d/c   ESRD on HD Patient receives dialysis on a Monday/Wednesday/Friday schedule on [employer].  Reports full dialysis session on 08/13/2021.  Currently patient states receiving dialysis through his right upper extremity AV fistula.  Also has tunneled HD catheter right chest in place that was removed by interventional radiology on 08/15/2021. --Nephrology following, appreciate assistance -Continue MWF HD   Hypothyroidism --Levothyroxine 50 mcg p.o. daily   HLD: Pravastatin 40 mg p.o. daily   BPH: Tamsulosin 0.4 mg p.o. daily   History of paroxysmal atrial fibrillation not on anticoagulation EKG on admission with sinus tachycardia.  Currently not on anticoagulation.  D-dimer elevated, but normal for adjusted age, CTA chest negative for PE.   CAD --Continue aspirin and statin   Depression/anxiety: BuSpar 5 mg p.o. twice daily   Anemia of chronic renal/medical disease Iron deficiency anemia Hemoglobin 12.9, stable. --Ferrous sulfate 325 mg daily on Monday/Wednesday/Friday   GERD: Protonix 40 g p.o. twice daily   Weakness/deconditioning/gait disturbance: Currently resides at [facility] SN[facility].  Prior to that was living alone in his own home. --PT/OT continue to recommend SNF        Consultants: Nephrology, General Surgery Procedures performed:   Disposition: Skilled nursing facility Diet recommendation:  Renal diet DISCHARGE MEDICATION: Allergies as of 08/20/2021       Reactions   Bee Venom Anaphylaxis   Has epi pen   Influenza Vaccines Other (See Comments)   "Mortally sick for 2 weeks"  Medication List     STOP taking these medications    amoxicillin-clavulanate 500-125 MG tablet Commonly known as: AUGMENTIN   predniSONE 20 MG tablet Commonly known as: DELTASONE    SOLU-Medrol (PF) 40 MG injection ACT-O-VIAL Generic drug: methylPREDNISolone sodium succinate       TAKE these medications    NOVASOURCE RENAL PO Take by mouth in the morning and at bedtime.   (feeding supplement) PROSource Plus liquid Take 30 mLs by mouth 2 (two) times daily between meals.   feeding supplement (NEPRO CARB STEADY) Liqd Take 237 mLs by mouth 3 (three) times daily before meals.   acetaminophen 325 MG tablet Commonly known as: TYLENOL Take 2 tablets (650 mg total) by mouth every 6 (six) hours as needed for mild pain (or Fever >/= 101).   albuterol 108 (90 Base) MCG/ACT inhaler Commonly known as: VENTOLIN HFA Inhale 1 puff into the lungs every 6 (six) hours as needed for shortness of breath.   aspirin EC 81 MG tablet Take 81 mg by mouth daily.   Biofreeze 4 % Gel Generic drug: Menthol (Topical Analgesic) Apply 1 Application topically 3 (three) times daily as needed (pain).   bisacodyl 10 MG suppository Commonly known as: DULCOLAX Place 10 mg rectally daily as needed for moderate constipation.   busPIRone 5 MG tablet Commonly known as: BUSPAR Take 5 mg by mouth 2 (two) times daily.   camphor-menthol lotion Commonly known as: SARNA Apply topically as needed for itching. What changed:  how much to take when to take this   cloNIDine 0.1 MG tablet Commonly known as: CATAPRES Take 1 tablet (0.1 mg total) by mouth daily.   DECUBI-VITE PO Take 1 tablet by mouth daily.   diltiazem 180 MG 24 hr capsule Commonly known as: CARDIZEM CD Take 1 capsule (180 mg total) by mouth daily.   docusate sodium 100 MG capsule Commonly known as: COLACE Take 1 capsule (100 mg total) by mouth every 12 (twelve) hours.   ergocalciferol 1.25 MG (50000 UT) capsule Commonly known as: VITAMIN D2 Take 50,000 Units by mouth every Friday.   feeding supplement (PRO-STAT SUGAR FREE 64) Liqd Take 30 mLs by mouth every evening.   ferrous sulfate 325 (65 FE) MG tablet Take 1  tablet by mouth See admin instructions. Take one tablet by mouth Every Monday, Wednesday, and Friday   hydrALAZINE 10 MG tablet Commonly known as: APRESOLINE Take 1 tablet (10 mg total) by mouth every 8 (eight) hours as needed (for SBP greater than 180).   HYDROcodone-acetaminophen 5-325 MG tablet Commonly known as: NORCO/VICODIN Take 1 tablet by mouth every 6 (six) hours as needed for moderate pain or severe pain.   hydrOXYzine 10 MG tablet Commonly known as: ATARAX Take 10 mg by mouth 3 (three) times daily as needed for itching.   ipratropium-albuterol 0.5-2.5 (3) MG/3ML Soln Commonly known as: DUONEB Take 3 mLs by nebulization every 4 (four) hours as needed (shortness of breath/wheezing).   Ipratropium-Albuterol 20-100 MCG/ACT Aers respimat Commonly known as: COMBIVENT Inhale 1 puff into the lungs 4 (four) times daily.   lanthanum 1000 MG chewable tablet Commonly known as: FOSRENOL Chew 1 tablet (1,000 mg total) by mouth 3 (three) times daily with meals.   levothyroxine 50 MCG tablet Commonly known as: SYNTHROID Take 50 mcg by mouth daily before breakfast.   melatonin 5 MG Tabs Take 5 mg by mouth at bedtime.   multivitamin tablet Take 1 tablet by mouth daily.   naphazoline-glycerin 0.012-0.25 % Soln  Commonly known as: CLEAR EYES REDNESS Place 1-2 drops into the right eye 3 (three) times daily.   neomycin-bacitracin-polymyxin Oint Commonly known as: NEOSPORIN Apply 1 application topically 3 (three) times daily. To right antecubital area   ondansetron 4 MG tablet Commonly known as: Zofran Take 1 tablet (4 mg total) by mouth daily as needed for nausea or vomiting.   pantoprazole 40 MG tablet Commonly known as: PROTONIX Take 1 tablet (40 mg total) by mouth 2 (two) times daily.   polyethylene glycol 17 g packet Commonly known as: MIRALAX / GLYCOLAX Take 17 g by mouth daily.   pravastatin 40 MG tablet Commonly known as: PRAVACHOL Take 40 mg by mouth at  bedtime.   tamsulosin 0.4 MG Caps capsule Commonly known as: FLOMAX Take 1 capsule (0.4 mg total) by mouth daily.        Follow-up Information     Rexene Agent, MD Follow up in 2 week(s).   Specialty: Nephrology Why: Hospital follow up Contact information: [place] [address] [phone]         Sherren Mocha, MD .   Specialty: Cardiology Contact information: [address] [place] [address] [phone]         Surgery, [place] Follow up.   Specialty: General Surgery Why: As needed Contact information: [address] [place] [address] [phone]                Discharge Exam: Danley Danker Weights   08/19/21 0500 08/20/21 0423 08/20/21 0850  Weight: 68.8 kg 70.1 kg 69.4 kg   General exam: Awake, laying in bed, in nad Respiratory system: Normal respiratory effort, no wheezing Cardiovascular system: regular rate, s1, s2 Gastrointestinal system: Soft, nondistended, positive BS Central nervous system: CN2-12 grossly intact, strength intact Extremities: Perfused, no clubbing Skin: Normal skin turgor, no notable skin lesions seen Psychiatry: Mood normal // no visual hallucinations   Condition at discharge: fair  The results of significant diagnostics from this hospitalization (including imaging, microbiology, ancillary and laboratory) are listed below for reference.   Imaging Studies: DG Abd 1 View  Result Date: 08/18/2021 CLINICAL DATA:  Enteric tube placement. EXAM: ABDOMEN - 1 VIEW COMPARISON:  Abdominal x-ray from same day at 0539 hours. FLUOROSCOPY TIME:  Radiation Exposure Index (as provided by the fluoroscopic device): 4.0 mGy Kerma FINDINGS: New enteric tube within the stomach. Unchanged small bowel dilatation. Similar just metallic density material throughout the visualized small bowel and colon. IMPRESSION: 1. New enteric tube within the stomach. Electronically Signed   By: Titus Dubin M.D.   On: 08/18/2021 08:53   DG Abd 1 View  Result Date: 08/18/2021 CLINICAL DATA:  Worsening abdominal distention. EXAM: ABDOMEN - 1 VIEW COMPARISON:  Abdominal x-ray and CT abdomen pelvis from yesterday. FINDINGS: Diffuse small bowel dilatation is not significantly changed. Similar ingested metallic density material throughout the small bowel and colon. IMPRESSION: 1. Unchanged small bowel obstruction. Electronically Signed   By: Titus Dubin M.D.   On: 08/18/2021 08:03   DG CHEST PORT 1 VIEW  Result Date: 08/18/2021 CLINICAL DATA:  Shortness of breath. EXAM: PORTABLE CHEST 1 VIEW COMPARISON:  CT chest dated August 15, 2021. Chest x-ray dated August 13, 2021. FINDINGS: The patient is rotated to the right. Interval removal of the tunneled right internal jugular dialysis catheter. The heart size and mediastinal contours are within normal limits. Normal pulmonary vascularity. Unchanged minimal bibasilar atelectasis/scarring. No focal consolidation, pleural effusion,  or pneumothorax. No acute osseous abnormality. Old right sixth rib fracture again noted. IMPRESSION: 1. No active disease. Electronically Signed   By: Titus Dubin M.D.   On: 08/18/2021 08:00   DG Abd Portable 1V-Small Bowel Obstruction Protocol-initial, 8 hr delay  Result Date: 08/17/2021 CLINICAL DATA:  Small-bowel obstruction EXAM: PORTABLE ABDOMEN - 1 VIEW COMPARISON:  CT from earlier in the same day. FINDINGS: Scattered large and small bowel gas is noted. Persistent small bowel dilatation is seen. No free air is noted. Scattered contrast material is noted throughout the large and small bowel related to prior CT examination. No definitive passage of small bowel contrast into the colon is noted at this time. Postsurgical changes in the lumbar spine and right hip are noted. IMPRESSION: Persistent small bowel dilatation. No passage of contrast material from the small bowel into the colon is noted at this time. Continued follow-up is  recommended. Electronically Signed   By: Inez Catalina M.D.   On: 08/17/2021 20:22   CT ABDOMEN PELVIS WO CONTRAST  Result Date: 08/17/2021 CLINICAL DATA:  Abdominal distension and pain, last bowel movement yesterday. EXAM: CT ABDOMEN AND PELVIS WITHOUT CONTRAST TECHNIQUE: Multidetector CT imaging of the abdomen and pelvis was performed following the standard protocol without IV contrast. RADIATION DOSE REDUCTION: This exam was performed according to the departmental dose-optimization program which includes automated exposure control, adjustment of the mA and/or kV according to patient size and/or use of iterative reconstruction technique. COMPARISON:  05/01/2021 FINDINGS: Lower chest: Normal heart size. Chronic small pericardial effusion. Extensive atheromatous calcification. A left pleural effusion is essentially resolved. Pleural thickening and subpleural opacification at the lung bases. Hepatobiliary: Soft tissue density mass in the right lobe liver measuring 2.1 cm, reference description June 2022 abdominal CT with contrast. No worrisome growth since prior.Cholecystectomy without bile duct dilatation. Pancreas: Coarse calcifications in the pancreas with ductal dilatation at the tail associated with a central stone, unchanged. No evidence of acute pancreatitis. Spleen: Unremarkable. Adrenals/Urinary Tract: Negative adrenals. Small kidneys with bilateral renal cystic densities. Bladder is largely obscured by streak artifact. Stomach/Bowel: Dilated small bowel loops with air contrast levels measuring up to 4.7 cm in diameter. A transition point is seen in the right lower quadrant at the level of the terminal ileum where there could be some small bowel wall thickening. Stool moderately distends the proximal colon. The distal colon is decompressed although containing high-density material that outlines innumerable colonic diverticula. As before, there is ingested metallic density material throughout the lumen of  the bowel. No appendicitis. Vascular/Lymphatic: No acute vascular abnormality. Confluent atheromatous calcification of the aorta. No mass or adenopathy. Reproductive:No acute finding Other: No ascites or pneumoperitoneum. Musculoskeletal: Solid fusion from L1-S1. Kyphotic deformity due to collapse and wedging at L1-2. Right hip replacement. Generalized osteopenia. IMPRESSION: 1. Small bowel obstruction with transition at the terminal ileum, likely a partial obstruction. 2. Decreased in colonic stool burden since March 2023. 3. Chronic calcific pancreatitis and renal atrophy. Electronically Signed   By: Jorje Guild M.D.   On: 08/17/2021 04:44   DG Abd 1 View  Result Date: 08/16/2021 CLINICAL DATA:  Abdominal pain EXAM: ABDOMEN - 1 VIEW COMPARISON:  None Available. FINDINGS: There is diffuse mild gaseous distension of the colon extending into the rectal vault suggesting a colonic ileus. There is high density material seen throughout the abdomen in keeping with residual flocculated barium contrast. No free intraperitoneal gas. Underpenetration precludes evaluation of the visceral shadows. Lumbar fusion with instrumentation has been performed.  Right total hip arthroplasty has been performed. IMPRESSION: Mild colonic ileus. No free intraperitoneal gas. Electronically Signed   By: Fidela Salisbury M.D.   On: 08/16/2021 22:59   CT Angio Chest Pulmonary Embolism (PE) W or WO Contrast  Result Date: 08/15/2021 CLINICAL DATA:  Pulmonary embolism suspected.  Positive D-dimer. EXAM: CT ANGIOGRAPHY CHEST WITH CONTRAST TECHNIQUE: Multidetector CT imaging of the chest was performed using the standard protocol during bolus administration of intravenous contrast. Multiplanar CT image reconstructions and MIPs were obtained to evaluate the vascular anatomy. RADIATION DOSE REDUCTION: This exam was performed according to the departmental dose-optimization program which includes automated exposure control, adjustment of the mA  and/or kV according to patient size and/or use of iterative reconstruction technique. CONTRAST:  81m OMNIPAQUE IOHEXOL 350 MG/ML SOLN COMPARISON:  CT chest 08/12/2020 and CT abdomen and pelvis 05/01/2021 FINDINGS: Cardiovascular: Negative for pulmonary embolism. Small to moderate amount of pericardial fluid along the anterior aspect of the heart has increased since 05/01/2021. Heart size is not enlarged. Atherosclerotic calcifications in the thoracic aorta without aneurysm. Coronary artery calcifications. Mediastinum/Nodes: Left thyroid nodules, largest measures 1.3 cm and no follow-up imaging is recommended. No lymph node enlargement in the mediastinum or hila. No axillary lymph node enlargement. Lungs/Pleura: Trachea and mainstem bronchi are patent. Centrilobular emphysema. Pleural calcifications along the posterior right upper lung. New 4 mm nodule at the right lung apex on sequence 6, image 16. Few bandlike densities at the lung bases are most compatible with areas of atelectasis or scarring. No significant airspace disease or consolidation in the lungs. Upper Abdomen: Again noted is a 2.1 cm lesion in the right hepatic lobe on sequence 5 image 153 and previously described as a hemangioma on MRI. Cholecystectomy. Again noted are bilateral renal cysts and some of these are hyperdense. Calcifications involving the distal pancreatic body and tail and there appears to be diffuse duct dilatation and low density in this area. Musculoskeletal: Old right sixth rib fracture. No acute bone abnormality. Review of the MIP images confirms the above findings. IMPRESSION: 1. Negative for a pulmonary embolism. 2. Densities at the lung bases are most compatible with atelectasis or scarring. There is no significant airspace disease or consolidation in lungs. 3. Aortic Atherosclerosis (ICD10-I70.0) and Emphysema (ICD10-J43.9). 4. New 4 mm pulmonary nodule at the right lung apex. Recommend a 12 month follow-up chest CT based on  the underlying emphysema. 5. Slightly increased pericardial fluid along the anterior aspect of the heart. 6. **An incidental finding of potential clinical significance has been found. Again noted is an abnormal appearance of the distal pancreatic body and pancreatic tail. There is duct dilatation in this area with calcifications. The findings could represent postinflammatory changes with duct dilatation but cannot exclude an underlying neoplasm as was discussed on the MRI from 09/09/2020. 7. Multiple renal cysts. Some of the cysts are hyperdense and similar to the prior examinations. Electronically Signed   By: AMarkus DaftM.D.   On: 08/15/2021 17:16   IR Removal Tun Cv Cath W/O FL  Result Date: 08/15/2021 INDICATION: ESRD on HD. Prior RIGHT IJ dialysis catheter. Patient with functioning RIGHT upper extremity AV fistula. EXAM: REMOVAL OF TUNNELED HEMODIALYSIS CATHETER MEDICATIONS: None COMPLICATIONS: None immediate. PROCEDURE: Informed written consent was obtained from the patient following an explanation of the procedure, risks, benefits and alternatives to treatment. A time out was performed prior to the initiation of the procedure. Maximal barrier sterile technique was utilized including caps, mask, sterile gowns, sterile gloves, large sterile  drape, hand hygiene, and chlorhexidine. 1% lidocaine with epinephrine was injected under sterile conditions along the subcutaneous tunnel. Utilizing a combination of blunt dissection and gentle traction, the catheter was removed intact. Hemostasis was obtained with manual compression. A dressing was placed. The patient tolerated the procedure well without immediate post procedural complication. IMPRESSION: Successful removal of RIGHT chest tunneled dialysis catheter. Procedure performed by Brynda Greathouse, IR PA Electronically Signed   By: Michaelle Birks M.D.   On: 08/15/2021 17:09   DG Chest Port 1 View  Result Date: 08/13/2021 CLINICAL DATA:  Questionable sepsis -  evaluate for abnormality EXAM: PORTABLE CHEST 1 VIEW COMPARISON:  Chest x-ray 04/06/2021, CT chest 08/12/2020 FINDINGS: Right chest wall dialysis catheter with tip overlying the superior cavoatrial junction. The heart and mediastinal contours are unchanged. Aortic calcification. Prominent hilar regions. Right base streaky airspace opacity. No focal consolidation. Chronic coarsened markings with likely increased superimposed interstitial markings. No pleural effusion. No pneumothorax. No acute osseous abnormality.  Old healed right rib fractures. IMPRESSION: 1. Mild pulmonary edema. 2. Right base streaky airspace opacity. Finding may represent a infection/inflammation. Followup PA and lateral chest X-ray is recommended in 3-4 weeks following therapy to ensure resolution and exclude underlying malignancy. 3. Aortic Atherosclerosis (ICD10-I70.0) and Emphysema (ICD10-J43.9). Electronically Signed   By: Iven Finn M.D.   On: 08/13/2021 18:25   VAS US DUPLEX DIALYSIS ACCESS (AVF, AVG)  Result Date: 08/06/2021 DIALYSIS ACCESS Patient Name:  BRYKER FLETCHALL  Date of Exam:   08/05/2021 Medical Rec #: [id number]         Accession #:    [id number] Date of Birth: 1934-12-31        Patient Gender: M Patient Age:   87 years Exam Location:  [facility] Procedure:      VAS US DUPLEX DIALYSIS ACCESS (AVF, AVG) Referring Phys: Jamelle Haring --------------------------------------------------------------------------------  Reason for Exam: Unable to dialyze through AVF/AVG. Access Site: Right Upper Extremity. Access Type: Brachial-cephalic AVF. History: Created on 04/03/21. No earlier follow up exam. Patient states they began          using the fistula 2 weeks ago. States he had a procedure at an outside          facility prior to use. Now unable to use, patient does not know why.          Arm is bruised. Patient currently has a catheter for HD. Performing Technologist: Ralene Cork RVT  Examination  Guidelines: A complete evaluation includes B-mode imaging, spectral Doppler, color Doppler, and power Doppler as needed of all accessible portions of each vessel. Unilateral testing is considered an integral part of a complete examination. Limited examinations for reoccurring indications may be performed as noted.  Findings: +--------------------+----------+-----------------+--------+ AVF                 PSV (cm/s)Flow Vol (mL/min)Comments +--------------------+----------+-----------------+--------+ Native artery inflow   271          1295                +--------------------+----------+-----------------+--------+ AVF Anastomosis        592                              +--------------------+----------+-----------------+--------+  +------------+----------+-------------+----------+-----------------------------+ OUTFLOW VEINPSV (cm/s)Diameter (cm)Depth (cm)          Describe            +------------+----------+-------------+----------+-----------------------------+ Prox UA  159        0.68        0.48           small branch          +------------+----------+-------------+----------+-----------------------------+ Mid UA         163        0.65        0.22                                 +------------+----------+-------------+----------+-----------------------------+ Dist UA        178        0.76        0.30                                 +------------+----------+-------------+----------+-----------------------------+ AC Fossa       335        0.58        0.58   fibrous strands and competing                                                         branch             +------------+----------+-------------+----------+-----------------------------+  Summary: Patent right BC AVF. Fibrous strands seen in the ACF/distal forearm. Branches seen in the ACF/distal forearm. (0.435 cm and 0.341 cm)  *See table(s) above for measurements and observations.  Diagnosing physician:  Deitra Mayo MD Electronically signed by Deitra Mayo MD on 08/06/2021 at 11:16:42 AM.   --------------------------------------------------------------------------------   Final     Microbiology: Results for orders placed or performed during the hospital encounter of 08/13/21  Blood Culture (routine x 2)     Status: None   Collection Time: 08/13/21  6:04 PM   Specimen: BLOOD  Result Value Ref Range Status   Specimen Description BLOOD LEFT ANTECUBITAL  Final   Special Requests   Final    BOTTLES DRAWN AEROBIC AND ANAEROBIC Blood Culture results may not be optimal due to an inadequate volume of blood received in culture bottles   Culture   Final    NO GROWTH 5 DAYS Performed at [facility], [place] [address], [place], [place] [address]    Report Status 08/18/2021 FINAL  Final  Blood Culture (routine x 2)     Status: Abnormal   Collection Time: 08/14/21  4:49 AM   Specimen: BLOOD LEFT FOREARM  Result Value Ref Range Status   Specimen Description BLOOD LEFT FOREARM  Final   Special Requests   Final    BOTTLES DRAWN AEROBIC AND ANAEROBIC Blood Culture results may not be optimal due to an inadequate volume of blood received in culture bottles   Culture  Setup Time   Final    GRAM POSITIVE RODS AEROBIC BOTTLE ONLY CRITICAL RESULT CALLED TO, READ BACK BY AND VERIFIED WITH: V BRYK,PHARMD'@0346'$  08/16/21 [place]    Culture (A)  Final    DIPHTHEROIDS(CORYNEBACTERIUM SPECIES) Standardized susceptibility testing for this organism is not available. Performed at [facility], [place] [address] [place], [place] [address]    Report Status 08/19/2021 FINAL  Final  SARS Coronavirus 2 by RT PCR (hospital order, performed in [facility] hospital lab) *cepheid single result test* Anterior Nasal Swab     Status: None   Collection Time: 08/14/21 12:42 PM   Specimen: Anterior Nasal Swab  Result Value Ref Range Status   SARS Coronavirus 2 by RT PCR NEGATIVE NEGATIVE Final    Comment:  (NOTE) SARS-CoV-2 target nucleic acids are NOT DETECTED.  The SARS-CoV-2 RNA is generally detectable in upper and lower respiratory specimens during the acute phase of infection. The lowest concentration of SARS-CoV-2 viral copies this assay can detect is 250 copies / mL. A negative result does not preclude SARS-CoV-2 infection and should not be used as the sole basis for treatment or other patient management decisions.  A negative result may occur with improper specimen collection / handling, submission of specimen other than nasopharyngeal swab, presence of viral mutation(s) within the areas targeted by this assay, and inadequate number of viral copies (<250 copies / mL). A negative result must be combined with clinical observations, patient history, and epidemiological information.  Fact Sheet for Patients:   https://www.patel.info/  Fact Sheet for Healthcare Providers: https://hall.com/  This test is not yet approved or  cleared by the [place] FDA and has been authorized for detection and/or diagnosis of SARS-CoV-2 by FDA under an Emergency Use Authorization (EUA).  This EUA will remain in effect (meaning this test can be used) for the duration of the COVID-19 declaration under Section 564(b)(1) of the Act, 21 U.S.C. section 360bbb-3(b)(1), unless the authorization is terminated or revoked sooner.  Performed at [facility], [place] [address] [place], [place] [address]   MRSA Next Gen by PCR, Nasal     Status: Abnormal   Collection Time: 08/15/21  6:22 AM   Specimen: Nasal Mucosa; Nasal Swab  Result Value Ref Range Status   MRSA by PCR Next Gen POSITIVE (A) NOT DETECTED Final    Comment: CRITICAL RESULT CALLED TO, READ BACK BY AND VERIFIED WITH: RN, Luberta Mutter [id number] '@0823'$  THANEY Performed at [facility], [place] [address] [place], [place] [address]     Labs: CBC: Recent Labs  Lab 08/13/21 1758 08/14/21 0447  08/15/21 0310 08/16/21 0152 08/16/21 0542 08/18/21 0333 08/19/21 0532 08/20/21 0401  WBC 8.8   < > 11.8* QUESTIONABLE RESULTS, RECOMMEND RECOLLECT TO VERIFY 11.0* 15.0* 9.6 9.6  NEUTROABS 5.8  --   --   --   --   --   --   --   HGB 13.5   < > 12.9*  --  14.1 15.9 12.6* 12.2*  HCT 41.1   < > 38.9*  --  41.9 48.9 39.1 38.7*  MCV 90.9   < > 88.4  --  88.2 87.6 89.5 91.3  PLT 283   < > 306  --  318 481* 297 251   < > = values in this interval not displayed.   Basic Metabolic Panel: Recent Labs  Lab 08/15/21 0310 08/17/21 0244 08/18/21 0333 08/19/21 0532 08/20/21 0401  NA 135 134* 136 139 138  K 4.8 4.7 6.0* 5.2* 4.9  CL 94* 89* 89* 97* 96*  CO2 27 23 20* 23 26  GLUCOSE 177* 117* 145* 95 91  BUN 37* 67* 90* 59* 76*  CREATININE 4.20* 6.90* 9.15* 6.31* 8.01*  CALCIUM 9.3 9.6 9.8 8.9 8.7*  PHOS 6.2* 7.0* 9.9* 9.9* 9.9*   Liver Function Tests: Recent Labs  Lab 08/13/21 1758 08/15/21 0310 08/17/21 0244 08/18/21 0333 08/19/21 0532 08/20/21 0401  AST 16  --   --   --   --   --  ALT 13  --   --   --   --   --   ALKPHOS 82  --   --   --   --   --   BILITOT 0.7  --   --   --   --   --   PROT 7.4  --   --   --   --   --   ALBUMIN 3.2* 3.0* 3.3* 3.6 2.8* 2.7*   CBG: No results for input(s): "GLUCAP" in the last 168 hours.  Discharge time spent: less than 30 minutes.  Signed: Marylu Lund, MD [facility] 08/20/2021

## 2021-08-20 NOTE — Progress Notes (Signed)
DISCHARGE NOTE [name] to be discharged Skilled nursing facility per MD order. Patient verbalized understanding.  Skin clean, dry and intact without evidence of skin break down, no evidence of skin tears noted. IV catheter discontinued intact. Site without signs and symptoms of complications. Dressing and pressure applied. Pt denies pain at the site currently. No complaints noted.  Patient free of lines, drains, and wounds.   Discharge packet assembled. An After Visit Summary (AVS) was printed and given to the EMS personnel. Patient escorted via stretcher and discharged to [facility] via ambulance. Report called to accepting facility; all questions and concerns addressed.   Vira Agar, RN

## 2021-08-20 NOTE — Progress Notes (Addendum)
[facility] Progress Note   Subjective: Seen on HD asking for muscle relaxer for pain in R leg. Educated about avoiding muscle relaxing medications in regular doses, avoiding baclofen all together. He looks much better. Hopefully going home soon.  Objective Vitals:   08/19/21 2252 08/20/21 0423 08/20/21 0850 08/20/21 0900  BP: (!) 156/62 (!) 123/49 (!) 192/161 (!) 163/54  Pulse: 90 76 72 78  Resp:  '16 11 13  '$ Temp:  98 F (36.7 C) 98.2 F (36.8 C)   TempSrc:  Oral Oral   SpO2: 92% 97% 95% 97%  Weight:  70.1 kg 69.4 kg   Height:       Physical Exam General elderly male in NAD Heart S1S2 no M/R/G Lungs: Decreased in bases, otherwise CTAB. Respirations shallow, no WOB.  Abdomen-NABS, NT, ND Extremities: No LE edema Neuro - alert and oriented x 3  AV Access:  RUE AVF bruised, tender but +T/B   Additional Objective Labs: Basic Metabolic Panel: Recent Labs  Lab 08/18/21 0333 08/19/21 0532 08/20/21 0401  NA 136 139 138  K 6.0* 5.2* 4.9  CL 89* 97* 96*  CO2 20* 23 26  GLUCOSE 145* 95 91  BUN 90* 59* 76*  CREATININE 9.15* 6.31* 8.01*  CALCIUM 9.8 8.9 8.7*  PHOS 9.9* 9.9* 9.9*   Liver Function Tests: Recent Labs  Lab 08/13/21 1758 08/15/21 0310 08/18/21 0333 08/19/21 0532 08/20/21 0401  AST 16  --   --   --   --   ALT 13  --   --   --   --   ALKPHOS 82  --   --   --   --   BILITOT 0.7  --   --   --   --   PROT 7.4  --   --   --   --   ALBUMIN 3.2*   < > 3.6 2.8* 2.7*   < > = values in this interval not displayed.   No results for input(s): "LIPASE", "AMYLASE" in the last 168 hours. CBC: Recent Labs  Lab 08/13/21 1758 08/14/21 0447 08/15/21 0310 08/16/21 0152 08/16/21 0542 08/18/21 0333 08/19/21 0532 08/20/21 0401  WBC 8.8   < > 11.8*   < > 11.0* 15.0* 9.6 9.6  NEUTROABS 5.8  --   --   --   --   --   --   --   HGB 13.5   < > 12.9*  --  14.1 15.9 12.6* 12.2*  HCT 41.1   < > 38.9*  --  41.9 48.9 39.1 38.7*  MCV 90.9   < > 88.4  --   88.2 87.6 89.5 91.3  PLT 283   < > 306  --  318 481* 297 251   < > = values in this interval not displayed.   Blood Culture    Component Value Date/Time   SDES BLOOD LEFT FOREARM 08/14/2021 0449   SPECREQUEST  08/14/2021 0449    BOTTLES DRAWN AEROBIC AND ANAEROBIC Blood Culture results may not be optimal due to an inadequate volume of blood received in culture bottles   CULT (A) 08/14/2021 0449    DIPHTHEROIDS(CORYNEBACTERIUM SPECIES) Standardized susceptibility testing for this organism is not available. Performed at [facility], [place] [address], [place], [place] [address]    REPTSTATUS 08/19/2021 FINAL 08/14/2021 0449    Cardiac Enzymes: No results for input(s): "CKTOTAL", "CKMB", "CKMBINDEX", "TROPONINI" in the last 168 hours. CBG: No results for input(s): "  GLUCAP" in the last 168 hours. Iron Studies: No results for input(s): "IRON", "TIBC", "TRANSFERRIN", "FERRITIN" in the last 72 hours. '@lablastinr3'$ @ Studies/Results: No results found. Medications:   busPIRone  5 mg Oral BID   Chlorhexidine Gluconate Cloth  6 each Topical Q0600   cloNIDine  0.1 mg Oral Daily   diltiazem  180 mg Oral Daily   docusate sodium  100 mg Oral Q12H   ferrous sulfate  325 mg Oral Q M,W,F-2000   heparin  5,000 Units Subcutaneous Q8H   heparin sodium (porcine)  1,400 Units Intravenous Once   levothyroxine  50 mcg Oral Q0600   lidocaine  5 mL Mouth/Throat Once   melatonin  5 mg Oral QHS   mupirocin ointment  1 Application Nasal BID   pantoprazole  40 mg Oral BID   pravastatin  40 mg Oral QHS   tamsulosin  0.4 mg Oral Daily     Dialysis Orders: [facility] MWF 4 hrs 180NRe 300/500 76 kg 3.0K/2.5 Ca TDC/AVF -Heparin 3000 units IV TIW -Venofer 100 mg IV X 10 (2/10 doses have been given) DC this order.      Assessment/Plan:  CAP/Acute hypoxic respiratory failure-ABX per primary. Resolved.   Pulmonary edema/Volume overload-pulmonary edema noted on CXR and by exam.  Serial dialysis over 3 days  last week with net UF 7L.   Edema is improved.  CT angio with no significant airspace disease. Resolved.  SBO - KUB w/mild colonic ileus, CT abd w/ likely partial SBO. Resolved.  COPD exacerbation-per primary  Pleuritic Pain- pain between shoulder blades worse with inspiration.  CTA negative for PE. W/U per primary. Resolved.   ESRD -  MWF. Next HD 08/22/2021. TDC removed by IR 08/15/21 appreciate assistance.   Hypertension/volume  - Was given fluid in HD yesterday. Rec'd at least 1 liter and had NS overnight. Looks better. UF as tolerated. Lower EDW on discharge.   Anemia  - HGB 12.2. Does not need Fe load or ESA. Follow HGB.   Metabolic bone disease -  Corrected Ca+ 10 PO4 7. Resume lanthanum binders when eating.   Nutrition - Albumin low. Add protein supps. Currently NPO.  H/O CVA H/O CVA H/O PAF not on AC  Kelin Borum H. Zamier Eggebrecht NP-C 08/20/2021, 10:07 AM  [employer] [phone]

## 2021-08-21 DIAGNOSIS — E039 Hypothyroidism, unspecified: Secondary | ICD-10-CM | POA: Diagnosis not present

## 2021-08-21 DIAGNOSIS — I10 Essential (primary) hypertension: Secondary | ICD-10-CM | POA: Diagnosis not present

## 2021-08-21 DIAGNOSIS — R278 Other lack of coordination: Secondary | ICD-10-CM | POA: Diagnosis not present

## 2021-08-21 DIAGNOSIS — D638 Anemia in other chronic diseases classified elsewhere: Secondary | ICD-10-CM | POA: Diagnosis not present

## 2021-08-21 DIAGNOSIS — Z992 Dependence on renal dialysis: Secondary | ICD-10-CM | POA: Diagnosis not present

## 2021-08-21 DIAGNOSIS — R262 Difficulty in walking, not elsewhere classified: Secondary | ICD-10-CM | POA: Diagnosis not present

## 2021-08-21 DIAGNOSIS — I120 Hypertensive chronic kidney disease with stage 5 chronic kidney disease or end stage renal disease: Secondary | ICD-10-CM | POA: Diagnosis not present

## 2021-08-21 DIAGNOSIS — I5033 Acute on chronic diastolic (congestive) heart failure: Secondary | ICD-10-CM | POA: Diagnosis not present

## 2021-08-21 DIAGNOSIS — E1122 Type 2 diabetes mellitus with diabetic chronic kidney disease: Secondary | ICD-10-CM | POA: Diagnosis not present

## 2021-08-21 DIAGNOSIS — N186 End stage renal disease: Secondary | ICD-10-CM | POA: Diagnosis not present

## 2021-08-21 DIAGNOSIS — N139 Obstructive and reflux uropathy, unspecified: Secondary | ICD-10-CM | POA: Diagnosis not present

## 2021-08-21 DIAGNOSIS — K861 Other chronic pancreatitis: Secondary | ICD-10-CM | POA: Diagnosis not present

## 2021-08-21 DIAGNOSIS — M6281 Muscle weakness (generalized): Secondary | ICD-10-CM | POA: Diagnosis not present

## 2021-08-21 DIAGNOSIS — M6259 Muscle wasting and atrophy, not elsewhere classified, multiple sites: Secondary | ICD-10-CM | POA: Diagnosis not present

## 2021-08-21 DIAGNOSIS — J9621 Acute and chronic respiratory failure with hypoxia: Secondary | ICD-10-CM | POA: Diagnosis not present

## 2021-08-21 DIAGNOSIS — R2681 Unsteadiness on feet: Secondary | ICD-10-CM | POA: Diagnosis not present

## 2021-08-21 DIAGNOSIS — R293 Abnormal posture: Secondary | ICD-10-CM | POA: Diagnosis not present

## 2021-08-21 DIAGNOSIS — J439 Emphysema, unspecified: Secondary | ICD-10-CM | POA: Diagnosis not present

## 2021-08-21 DIAGNOSIS — E782 Mixed hyperlipidemia: Secondary | ICD-10-CM | POA: Diagnosis not present

## 2021-08-21 DIAGNOSIS — I2510 Atherosclerotic heart disease of native coronary artery without angina pectoris: Secondary | ICD-10-CM | POA: Diagnosis not present

## 2021-08-21 DIAGNOSIS — D631 Anemia in chronic kidney disease: Secondary | ICD-10-CM | POA: Diagnosis not present

## 2021-08-21 DIAGNOSIS — K5669 Other partial intestinal obstruction: Secondary | ICD-10-CM | POA: Diagnosis not present

## 2021-08-21 DIAGNOSIS — J449 Chronic obstructive pulmonary disease, unspecified: Secondary | ICD-10-CM | POA: Diagnosis not present

## 2021-08-21 DIAGNOSIS — I639 Cerebral infarction, unspecified: Secondary | ICD-10-CM | POA: Diagnosis not present

## 2021-08-21 DIAGNOSIS — J189 Pneumonia, unspecified organism: Secondary | ICD-10-CM | POA: Diagnosis not present

## 2021-08-21 DIAGNOSIS — I739 Peripheral vascular disease, unspecified: Secondary | ICD-10-CM | POA: Diagnosis not present

## 2021-08-21 DIAGNOSIS — I679 Cerebrovascular disease, unspecified: Secondary | ICD-10-CM | POA: Diagnosis not present

## 2021-08-21 DIAGNOSIS — D500 Iron deficiency anemia secondary to blood loss (chronic): Secondary | ICD-10-CM | POA: Diagnosis not present

## 2021-08-21 DIAGNOSIS — K259 Gastric ulcer, unspecified as acute or chronic, without hemorrhage or perforation: Secondary | ICD-10-CM | POA: Diagnosis not present

## 2021-08-21 DIAGNOSIS — K869 Disease of pancreas, unspecified: Secondary | ICD-10-CM | POA: Diagnosis not present

## 2021-08-22 DIAGNOSIS — I10 Essential (primary) hypertension: Secondary | ICD-10-CM | POA: Diagnosis not present

## 2021-08-22 DIAGNOSIS — F419 Anxiety disorder, unspecified: Secondary | ICD-10-CM | POA: Diagnosis not present

## 2021-08-22 DIAGNOSIS — R600 Localized edema: Secondary | ICD-10-CM | POA: Diagnosis not present

## 2021-08-22 DIAGNOSIS — J449 Chronic obstructive pulmonary disease, unspecified: Secondary | ICD-10-CM | POA: Diagnosis not present

## 2021-08-24 DIAGNOSIS — R2681 Unsteadiness on feet: Secondary | ICD-10-CM | POA: Diagnosis not present

## 2021-08-24 DIAGNOSIS — Z992 Dependence on renal dialysis: Secondary | ICD-10-CM | POA: Diagnosis not present

## 2021-08-24 DIAGNOSIS — F419 Anxiety disorder, unspecified: Secondary | ICD-10-CM | POA: Diagnosis not present

## 2021-08-24 DIAGNOSIS — N139 Obstructive and reflux uropathy, unspecified: Secondary | ICD-10-CM | POA: Diagnosis not present

## 2021-08-24 DIAGNOSIS — J439 Emphysema, unspecified: Secondary | ICD-10-CM | POA: Diagnosis not present

## 2021-08-24 DIAGNOSIS — M6281 Muscle weakness (generalized): Secondary | ICD-10-CM | POA: Diagnosis not present

## 2021-08-24 DIAGNOSIS — I739 Peripheral vascular disease, unspecified: Secondary | ICD-10-CM | POA: Diagnosis not present

## 2021-08-24 DIAGNOSIS — J449 Chronic obstructive pulmonary disease, unspecified: Secondary | ICD-10-CM | POA: Diagnosis not present

## 2021-08-24 DIAGNOSIS — I120 Hypertensive chronic kidney disease with stage 5 chronic kidney disease or end stage renal disease: Secondary | ICD-10-CM | POA: Diagnosis not present

## 2021-08-24 DIAGNOSIS — E1122 Type 2 diabetes mellitus with diabetic chronic kidney disease: Secondary | ICD-10-CM | POA: Diagnosis not present

## 2021-08-24 DIAGNOSIS — R262 Difficulty in walking, not elsewhere classified: Secondary | ICD-10-CM | POA: Diagnosis not present

## 2021-08-24 DIAGNOSIS — R293 Abnormal posture: Secondary | ICD-10-CM | POA: Diagnosis not present

## 2021-08-24 DIAGNOSIS — K861 Other chronic pancreatitis: Secondary | ICD-10-CM | POA: Diagnosis not present

## 2021-08-24 DIAGNOSIS — D631 Anemia in chronic kidney disease: Secondary | ICD-10-CM | POA: Diagnosis not present

## 2021-08-24 DIAGNOSIS — I2510 Atherosclerotic heart disease of native coronary artery without angina pectoris: Secondary | ICD-10-CM | POA: Diagnosis not present

## 2021-08-24 DIAGNOSIS — I679 Cerebrovascular disease, unspecified: Secondary | ICD-10-CM | POA: Diagnosis not present

## 2021-08-24 DIAGNOSIS — N186 End stage renal disease: Secondary | ICD-10-CM | POA: Diagnosis not present

## 2021-08-25 DIAGNOSIS — K861 Other chronic pancreatitis: Secondary | ICD-10-CM | POA: Diagnosis not present

## 2021-08-25 DIAGNOSIS — J449 Chronic obstructive pulmonary disease, unspecified: Secondary | ICD-10-CM | POA: Diagnosis not present

## 2021-08-25 DIAGNOSIS — E1122 Type 2 diabetes mellitus with diabetic chronic kidney disease: Secondary | ICD-10-CM | POA: Diagnosis not present

## 2021-08-25 DIAGNOSIS — N186 End stage renal disease: Secondary | ICD-10-CM | POA: Diagnosis not present

## 2021-08-25 DIAGNOSIS — I679 Cerebrovascular disease, unspecified: Secondary | ICD-10-CM | POA: Diagnosis not present

## 2021-08-25 DIAGNOSIS — R262 Difficulty in walking, not elsewhere classified: Secondary | ICD-10-CM | POA: Diagnosis not present

## 2021-08-26 DIAGNOSIS — R262 Difficulty in walking, not elsewhere classified: Secondary | ICD-10-CM | POA: Diagnosis not present

## 2021-08-26 DIAGNOSIS — E1122 Type 2 diabetes mellitus with diabetic chronic kidney disease: Secondary | ICD-10-CM | POA: Diagnosis not present

## 2021-08-26 DIAGNOSIS — K861 Other chronic pancreatitis: Secondary | ICD-10-CM | POA: Diagnosis not present

## 2021-08-26 DIAGNOSIS — J449 Chronic obstructive pulmonary disease, unspecified: Secondary | ICD-10-CM | POA: Diagnosis not present

## 2021-08-26 DIAGNOSIS — I679 Cerebrovascular disease, unspecified: Secondary | ICD-10-CM | POA: Diagnosis not present

## 2021-08-26 DIAGNOSIS — N186 End stage renal disease: Secondary | ICD-10-CM | POA: Diagnosis not present

## 2021-08-27 DIAGNOSIS — N186 End stage renal disease: Secondary | ICD-10-CM | POA: Diagnosis not present

## 2021-08-27 DIAGNOSIS — K861 Other chronic pancreatitis: Secondary | ICD-10-CM | POA: Diagnosis not present

## 2021-08-27 DIAGNOSIS — J449 Chronic obstructive pulmonary disease, unspecified: Secondary | ICD-10-CM | POA: Diagnosis not present

## 2021-08-27 DIAGNOSIS — I679 Cerebrovascular disease, unspecified: Secondary | ICD-10-CM | POA: Diagnosis not present

## 2021-08-27 DIAGNOSIS — E1122 Type 2 diabetes mellitus with diabetic chronic kidney disease: Secondary | ICD-10-CM | POA: Diagnosis not present

## 2021-08-27 DIAGNOSIS — R262 Difficulty in walking, not elsewhere classified: Secondary | ICD-10-CM | POA: Diagnosis not present

## 2021-08-28 DIAGNOSIS — N186 End stage renal disease: Secondary | ICD-10-CM | POA: Diagnosis not present

## 2021-08-28 DIAGNOSIS — E1122 Type 2 diabetes mellitus with diabetic chronic kidney disease: Secondary | ICD-10-CM | POA: Diagnosis not present

## 2021-08-28 DIAGNOSIS — R262 Difficulty in walking, not elsewhere classified: Secondary | ICD-10-CM | POA: Diagnosis not present

## 2021-08-28 DIAGNOSIS — I679 Cerebrovascular disease, unspecified: Secondary | ICD-10-CM | POA: Diagnosis not present

## 2021-08-28 DIAGNOSIS — J449 Chronic obstructive pulmonary disease, unspecified: Secondary | ICD-10-CM | POA: Diagnosis not present

## 2021-08-28 DIAGNOSIS — K861 Other chronic pancreatitis: Secondary | ICD-10-CM | POA: Diagnosis not present

## 2021-08-28 DIAGNOSIS — L89134 Pressure ulcer of right lower back, stage 4: Secondary | ICD-10-CM | POA: Diagnosis not present

## 2021-08-29 DIAGNOSIS — I679 Cerebrovascular disease, unspecified: Secondary | ICD-10-CM | POA: Diagnosis not present

## 2021-08-29 DIAGNOSIS — J449 Chronic obstructive pulmonary disease, unspecified: Secondary | ICD-10-CM | POA: Diagnosis not present

## 2021-08-29 DIAGNOSIS — K861 Other chronic pancreatitis: Secondary | ICD-10-CM | POA: Diagnosis not present

## 2021-08-29 DIAGNOSIS — R262 Difficulty in walking, not elsewhere classified: Secondary | ICD-10-CM | POA: Diagnosis not present

## 2021-08-29 DIAGNOSIS — N186 End stage renal disease: Secondary | ICD-10-CM | POA: Diagnosis not present

## 2021-08-29 DIAGNOSIS — E1122 Type 2 diabetes mellitus with diabetic chronic kidney disease: Secondary | ICD-10-CM | POA: Diagnosis not present

## 2021-09-01 DIAGNOSIS — N186 End stage renal disease: Secondary | ICD-10-CM | POA: Diagnosis not present

## 2021-09-01 DIAGNOSIS — R262 Difficulty in walking, not elsewhere classified: Secondary | ICD-10-CM | POA: Diagnosis not present

## 2021-09-01 DIAGNOSIS — E1122 Type 2 diabetes mellitus with diabetic chronic kidney disease: Secondary | ICD-10-CM | POA: Diagnosis not present

## 2021-09-01 DIAGNOSIS — J449 Chronic obstructive pulmonary disease, unspecified: Secondary | ICD-10-CM | POA: Diagnosis not present

## 2021-09-01 DIAGNOSIS — I679 Cerebrovascular disease, unspecified: Secondary | ICD-10-CM | POA: Diagnosis not present

## 2021-09-01 DIAGNOSIS — K861 Other chronic pancreatitis: Secondary | ICD-10-CM | POA: Diagnosis not present

## 2021-09-02 DIAGNOSIS — N186 End stage renal disease: Secondary | ICD-10-CM | POA: Diagnosis not present

## 2021-09-02 DIAGNOSIS — J449 Chronic obstructive pulmonary disease, unspecified: Secondary | ICD-10-CM | POA: Diagnosis not present

## 2021-09-02 DIAGNOSIS — K861 Other chronic pancreatitis: Secondary | ICD-10-CM | POA: Diagnosis not present

## 2021-09-02 DIAGNOSIS — R262 Difficulty in walking, not elsewhere classified: Secondary | ICD-10-CM | POA: Diagnosis not present

## 2021-09-02 DIAGNOSIS — E1122 Type 2 diabetes mellitus with diabetic chronic kidney disease: Secondary | ICD-10-CM | POA: Diagnosis not present

## 2021-09-02 DIAGNOSIS — I679 Cerebrovascular disease, unspecified: Secondary | ICD-10-CM | POA: Diagnosis not present

## 2021-09-03 ENCOUNTER — Other Ambulatory Visit: Payer: Self-pay

## 2021-09-03 ENCOUNTER — Encounter (HOSPITAL_COMMUNITY): Payer: Self-pay

## 2021-09-03 ENCOUNTER — Emergency Department (HOSPITAL_COMMUNITY): Payer: No Typology Code available for payment source

## 2021-09-03 ENCOUNTER — Emergency Department (HOSPITAL_COMMUNITY)
Admission: EM | Admit: 2021-09-03 | Discharge: 2021-09-04 | Disposition: A | Discharge status: Home / Self Care | Payer: No Typology Code available for payment source | Attending: Emergency Medicine | Admitting: Emergency Medicine

## 2021-09-03 DIAGNOSIS — Z992 Dependence on renal dialysis: Secondary | ICD-10-CM | POA: Insufficient documentation

## 2021-09-03 DIAGNOSIS — E1122 Type 2 diabetes mellitus with diabetic chronic kidney disease: Secondary | ICD-10-CM | POA: Diagnosis not present

## 2021-09-03 DIAGNOSIS — N186 End stage renal disease: Secondary | ICD-10-CM | POA: Diagnosis not present

## 2021-09-03 DIAGNOSIS — T07XXXA Unspecified multiple injuries, initial encounter: Secondary | ICD-10-CM

## 2021-09-03 DIAGNOSIS — W06XXXA Fall from bed, initial encounter: Secondary | ICD-10-CM | POA: Diagnosis not present

## 2021-09-03 DIAGNOSIS — R609 Edema, unspecified: Secondary | ICD-10-CM | POA: Diagnosis not present

## 2021-09-03 DIAGNOSIS — Z7982 Long term (current) use of aspirin: Secondary | ICD-10-CM | POA: Insufficient documentation

## 2021-09-03 DIAGNOSIS — W19XXXA Unspecified fall, initial encounter: Secondary | ICD-10-CM

## 2021-09-03 DIAGNOSIS — R262 Difficulty in walking, not elsewhere classified: Secondary | ICD-10-CM | POA: Diagnosis not present

## 2021-09-03 DIAGNOSIS — K861 Other chronic pancreatitis: Secondary | ICD-10-CM | POA: Diagnosis not present

## 2021-09-03 DIAGNOSIS — S299XXA Unspecified injury of thorax, initial encounter: Secondary | ICD-10-CM | POA: Diagnosis not present

## 2021-09-03 DIAGNOSIS — S40812A Abrasion of left upper arm, initial encounter: Secondary | ICD-10-CM | POA: Insufficient documentation

## 2021-09-03 DIAGNOSIS — J449 Chronic obstructive pulmonary disease, unspecified: Secondary | ICD-10-CM | POA: Diagnosis not present

## 2021-09-03 DIAGNOSIS — R000 Tachycardia, unspecified: Secondary | ICD-10-CM | POA: Insufficient documentation

## 2021-09-03 DIAGNOSIS — S80912A Unspecified superficial injury of left knee, initial encounter: Secondary | ICD-10-CM | POA: Diagnosis not present

## 2021-09-03 DIAGNOSIS — Z743 Need for continuous supervision: Secondary | ICD-10-CM | POA: Diagnosis not present

## 2021-09-03 DIAGNOSIS — I679 Cerebrovascular disease, unspecified: Secondary | ICD-10-CM | POA: Diagnosis not present

## 2021-09-03 DIAGNOSIS — S79912A Unspecified injury of left hip, initial encounter: Secondary | ICD-10-CM | POA: Insufficient documentation

## 2021-09-03 DIAGNOSIS — S50312A Abrasion of left elbow, initial encounter: Secondary | ICD-10-CM | POA: Insufficient documentation

## 2021-09-03 DIAGNOSIS — S4992XA Unspecified injury of left shoulder and upper arm, initial encounter: Secondary | ICD-10-CM | POA: Diagnosis present

## 2021-09-03 LAB — CBC WITH DIFFERENTIAL/PLATELET
Abs Immature Granulocytes: 0.07 10*3/uL (ref 0.00–0.07)
Basophils Absolute: 0 10*3/uL (ref 0.0–0.1)
Basophils Relative: 0 %
Eosinophils Absolute: 0.5 10*3/uL (ref 0.0–0.5)
Eosinophils Relative: 5 %
HCT: 33.9 % — ABNORMAL LOW (ref 39.0–52.0)
Hemoglobin: 11.1 g/dL — ABNORMAL LOW (ref 13.0–17.0)
Immature Granulocytes: 1 %
Lymphocytes Relative: 8 %
Lymphs Abs: 0.8 10*3/uL (ref 0.7–4.0)
MCH: 28.8 pg (ref 26.0–34.0)
MCHC: 32.7 g/dL (ref 30.0–36.0)
MCV: 88.1 fL (ref 80.0–100.0)
Monocytes Absolute: 0.9 10*3/uL (ref 0.1–1.0)
Monocytes Relative: 9 %
Neutro Abs: 7.8 10*3/uL — ABNORMAL HIGH (ref 1.7–7.7)
Neutrophils Relative %: 77 %
Platelets: 211 10*3/uL (ref 150–400)
RBC: 3.85 MIL/uL — ABNORMAL LOW (ref 4.22–5.81)
RDW: 15.4 % (ref 11.5–15.5)
WBC: 10.1 10*3/uL (ref 4.0–10.5)
nRBC: 0 % (ref 0.0–0.2)

## 2021-09-03 LAB — BASIC METABOLIC PANEL
Anion gap: 13 (ref 5–15)
BUN: 22 mg/dL (ref 8–23)
CO2: 27 mmol/L (ref 22–32)
Calcium: 8.6 mg/dL — ABNORMAL LOW (ref 8.9–10.3)
Chloride: 99 mmol/L (ref 98–111)
Creatinine, Ser: 2.94 mg/dL — ABNORMAL HIGH (ref 0.61–1.24)
GFR, Estimated: 20 mL/min — ABNORMAL LOW (ref >60)
Glucose, Bld: 86 mg/dL (ref 70–99)
Potassium: 4.3 mmol/L (ref 3.5–5.1)
Sodium: 139 mmol/L (ref 135–145)

## 2021-09-03 MED ORDER — HYDROCODONE-ACETAMINOPHEN 5-325 MG PO TABS
1.0000 | ORAL_TABLET | Freq: Once | ORAL | Status: AC
  Administered 2021-09-03: 1 via ORAL
  Filled 2021-09-03: qty 1

## 2021-09-03 MED ORDER — FENTANYL CITRATE PF 50 MCG/ML IJ SOSY
100.0000 ug | PREFILLED_SYRINGE | Freq: Once | INTRAMUSCULAR | Status: AC
  Administered 2021-09-03: 100 ug via INTRAVENOUS
  Filled 2021-09-03: qty 2

## 2021-09-03 NOTE — ED Notes (Signed)
Report received from [place], [place]. All questions answered. Patient currently in radiology.

## 2021-09-03 NOTE — ED Notes (Signed)
Skin tear to left elbow cleaned with NS, non stick dressing applied, secured with kerlix and tape.

## 2021-09-03 NOTE — ED Triage Notes (Signed)
Pt BIB [facility] from his dialysis center after his full Tx d/t his c/o pain in his Left hip, ribs & arm d/t a fall he had last night. A/Ox4, COPD at baseline & wears 4L O2 via n/c. 130/60, 100 bpm, 20 resp, 97% on RA.

## 2021-09-03 NOTE — ED Provider Notes (Signed)
[facility] Provider Note   CSN: [id number] Arrival date & time: 09/03/21  1744     History  Chief Complaint  Patient presents with   Wayne Meadows is a 87 y.o. male.  HPI He [place] out of bed last night,-- injured left elbow, left ribs left hip and left knee.  He states that a nurse helped him back into bed, and he helped the nurse bandaged the wounds of his left elbow.  He was able to complete dialysis today.     Home Medications Prior to Admission medications   Medication Sig Start Date End Date Taking? Authorizing Provider  acetaminophen (TYLENOL) 325 MG tablet Take 2 tablets (650 mg total) by mouth every 6 (six) hours as needed for mild pain (or Fever >/= 101). 04/10/21   Bonnielee Haff, MD  albuterol (VENTOLIN HFA) 108 (90 Base) MCG/ACT inhaler Inhale 1 puff into the lungs every 6 (six) hours as needed for shortness of breath.    [provider]  Amino Acids-Protein Hydrolys (FEEDING SUPPLEMENT, PRO-STAT SUGAR FREE 64,) LIQD Take 30 mLs by mouth every evening.    [provider]  aspirin EC 81 MG tablet Take 81 mg by mouth daily.    [provider]  bisacodyl (DULCOLAX) 10 MG suppository Place 10 mg rectally daily as needed for moderate constipation.    [provider]  busPIRone (BUSPAR) 5 MG tablet Take 5 mg by mouth 2 (two) times daily.    [provider]  camphor-menthol Timoteo Ace) lotion Apply topically as needed for itching. Patient taking differently: Apply 1 Application topically 2 (two) times daily as needed for itching. 04/23/21   Alma Friendly, MD  cloNIDine (CATAPRES) 0.1 MG tablet Take 1 tablet (0.1 mg total) by mouth daily. 06/24/20   Harvie Heck, MD  diltiazem (CARDIZEM CD) 180 MG 24 hr capsule Take 1 capsule (180 mg total) by mouth daily. 04/11/21   Bonnielee Haff, MD  docusate sodium (COLACE) 100 MG capsule Take 1 capsule (100 mg total) by mouth every 12 (twelve)  hours. 05/01/21   Sherwood Gambler, MD  ergocalciferol (VITAMIN D2) 1.25 MG (50000 UT) capsule Take 50,000 Units by mouth every Friday.    [provider]  ferrous sulfate 325 (65 FE) MG tablet Take 1 tablet by mouth See admin instructions. Take one tablet by mouth Every Monday, Wednesday, and Friday 07/30/20   [provider]  hydrALAZINE (APRESOLINE) 10 MG tablet Take 1 tablet (10 mg total) by mouth every 8 (eight) hours as needed (for SBP greater than 180). 04/04/21   Little Ishikawa, MD  HYDROcodone-acetaminophen (NORCO/VICODIN) 5-325 MG tablet Take 1 tablet by mouth every 6 (six) hours as needed for moderate pain or severe pain. 04/23/21   Alma Friendly, MD  hydrOXYzine (ATARAX) 10 MG tablet Take 10 mg by mouth 3 (three) times daily as needed for itching.    [provider]  Ipratropium-Albuterol (COMBIVENT) 20-100 MCG/ACT AERS respimat Inhale 1 puff into the lungs 4 (four) times daily.    [provider]  ipratropium-albuterol (DUONEB) 0.5-2.5 (3) MG/3ML SOLN Take 3 mLs by nebulization every 4 (four) hours as needed (shortness of breath/wheezing).    [provider]  lanthanum (FOSRENOL) 1000 MG chewable tablet Chew 1 tablet (1,000 mg total) by mouth 3 (three) times daily with meals. 04/04/21   Little Ishikawa, MD  levothyroxine (SYNTHROID) 50 MCG tablet Take 50 mcg by mouth daily before  breakfast.    [provider]  melatonin 5 MG TABS Take 5 mg by mouth at bedtime.    [provider]  Menthol, Topical Analgesic, (BIOFREEZE) 4 % GEL Apply 1 Application topically 3 (three) times daily as needed (pain).    [provider]  Multiple Vitamin (MULTIVITAMIN) tablet Take 1 tablet by mouth daily.    [provider]  Multiple Vitamins-Minerals (DECUBI-VITE PO) Take 1 tablet by mouth daily. Patient not taking: Reported on 08/14/2021    [provider]  naphazoline-glycerin (CLEAR EYES REDNESS) 0.012-0.25 %  SOLN Place 1-2 drops into the right eye 3 (three) times daily. 04/23/21   Alma Friendly, MD  neomycin-bacitracin-polymyxin (NEOSPORIN) OINT Apply 1 application topically 3 (three) times daily. To right antecubital area Patient not taking: Reported on 08/14/2021 04/10/21   Bonnielee Haff, MD  Nutritional Supplements (,FEEDING SUPPLEMENT, PROSOURCE PLUS) liquid Take 30 mLs by mouth 2 (two) times daily between meals. Patient not taking: Reported on 08/14/2021 04/23/21   Alma Friendly, MD  Nutritional Supplements (FEEDING SUPPLEMENT, NEPRO CARB STEADY,) LIQD Take 237 mLs by mouth 3 (three) times daily before meals. Patient not taking: Reported on 08/14/2021 04/23/21   Alma Friendly, MD  Nutritional Supplements (NOVASOURCE RENAL PO) Take by mouth in the morning and at bedtime.    [provider]  ondansetron (ZOFRAN) 4 MG tablet Take 1 tablet (4 mg total) by mouth daily as needed for nausea or vomiting. 04/11/21 04/11/22  Bonnielee Haff, MD  pantoprazole (PROTONIX) 40 MG tablet Take 1 tablet (40 mg total) by mouth 2 (two) times daily. 04/10/21   Bonnielee Haff, MD  polyethylene glycol (MIRALAX / GLYCOLAX) 17 g packet Take 17 g by mouth daily. 05/01/21   Sherwood Gambler, MD  pravastatin (PRAVACHOL) 40 MG tablet Take 40 mg by mouth at bedtime.    [provider]  tamsulosin (FLOMAX) 0.4 MG CAPS capsule Take 1 capsule (0.4 mg total) by mouth daily. 04/11/21   Bonnielee Haff, MD      Allergies    Bee venom and Influenza vaccines    Review of Systems   Review of Systems  Physical Exam Updated Vital Signs BP 133/71 (BP Location: Left Wrist)   Pulse (!) 105   Temp 98.2 F (36.8 C) (Oral)   Resp 16   SpO2 93%  Physical Exam Vitals and nursing note reviewed.  Constitutional:      General: He is in acute distress (He is uncomfortable).     Appearance: He is well-developed. He is not ill-appearing.  HENT:     Head: Normocephalic and atraumatic.     Right Ear: External  ear normal.     Left Ear: External ear normal.  Eyes:     Conjunctiva/sclera: Conjunctivae normal.     Pupils: Pupils are equal, round, and reactive to light.  Neck:     Trachea: Phonation normal.  Cardiovascular:     Rate and Rhythm: Normal rate and regular rhythm.     Heart sounds: Normal heart sounds.     Comments: Vascular fistula right upper arm, appears normal with a normal pulsation. Pulmonary:     Effort: Pulmonary effort is normal.     Breath sounds: Normal breath sounds.  Chest:     Chest wall: Tenderness present.  Abdominal:     Palpations: Abdomen is soft.     Tenderness: There is no abdominal tenderness.  Musculoskeletal:     Cervical back: Normal range of motion and neck  supple.     Comments: Guards against movement of the left hip secondary to pain.  Guards against movement of the left elbow secondary to pain.  No deformity of the left knee.  Chest wall tenderness is present.  Skin:    General: Skin is warm and dry.     Comments: Abrasion left elbow  Neurological:     Mental Status: He is alert and oriented to person, place, and time.     Cranial Nerves: No cranial nerve deficit.     Sensory: No sensory deficit.     Motor: No abnormal muscle tone.     Coordination: Coordination normal.  Psychiatric:        Mood and Affect: Mood normal.        Behavior: Behavior normal.        Thought Content: Thought content normal.        Judgment: Judgment normal.     ED Results / Procedures / Treatments   Labs (all labs ordered are listed, but only abnormal results are displayed) Labs Reviewed  BASIC METABOLIC PANEL - Abnormal; Notable for the following components:      Result Value   Creatinine, Ser 2.94 (*)    Calcium 8.6 (*)    GFR, Estimated 20 (*)    All other components within normal limits  CBC WITH DIFFERENTIAL/PLATELET - Abnormal; Notable for the following components:   RBC 3.85 (*)    Hemoglobin 11.1 (*)    HCT 33.9 (*)    Neutro Abs 7.8 (*)    All  other components within normal limits    EKG EKG Interpretation  Date/Time:  Wednesday September 03 2021 17:45:36 EDT Ventricular Rate:  100 PR Interval:  184 QRS Duration: 98 QT Interval:  389 QTC Calculation: 502 R Axis:   88 Text Interpretation: Sinus tachycardia Multiple ventricular premature complexes Consider left atrial enlargement Anterior infarct, old Prolonged QT interval Since last tracing QT has lengthened and pvc are new Otherwise no significant change Confirmed by Daleen Bo [phone]) on 09/03/2021 6:03:51 PM  Radiology DG Hip Unilat With Pelvis 2-3 Views Left  Result Date: 09/03/2021 CLINICAL DATA:  Pain EXAM: DG HIP (WITH OR WITHOUT PELVIS) 2-3V LEFT COMPARISON:  Radiograph 08/18/2021 FINDINGS: Prior right total hip arthroplasty and lumbosacral fusion. There is no evidence of acute fracture. There is sclerosis of the femoral head. No articular surface collapse. There is mild left hip osteoarthritis. Vascular calcifications. There is persistent hyperdense material presumably ingested and bowel. IMPRESSION: No evidence of acute fracture. 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 No articular surface collapse. Mild left hip osteoarthritis. Electronically Signed   By: Maurine Simmering M.D.   On: 09/03/2021 19:39   DG Knee Complete 4 Views Left  Result Date: 09/03/2021 CLINICAL DATA:  [place] off bed this morning.  Left knee pain. EXAM: LEFT KNEE - COMPLETE 4+ VIEW COMPARISON:  None Available. FINDINGS: No evidence of fracture, dislocation, or joint effusion. No evidence of arthropathy or other focal bone abnormality. Generalized osteopenia noted. Peripheral vascular calcification also seen. IMPRESSION: No acute findings. Electronically Signed   By: Marlaine Hind M.D.   On: 09/03/2021 19:36   DG Ribs Unilateral W/Chest Left  Result Date: 09/03/2021 CLINICAL DATA:  Fall. EXAM: LEFT RIBS AND CHEST - 3+ VIEW COMPARISON:  None Available. FINDINGS: No acute left-sided rib  fractures are seen. There are healed left seventh and eighth rib fractures. There is linear atelectasis in the lung bases. The lungs are otherwise  clear. Cardiomediastinal silhouette within normal limits. No pleural effusion or pneumothorax. There is a healed right sixth rib fracture. IMPRESSION: 1. No acute rib fractures are seen. 2. Bibasilar atelectasis. Electronically Signed   By: Ronney Asters M.D.   On: 09/03/2021 19:36   DG Elbow Complete Left  Result Date: 09/03/2021 CLINICAL DATA:  Pain. EXAM: LEFT ELBOW - COMPLETE 3+ VIEW COMPARISON:  None Available. FINDINGS: There is no acute fracture or dislocation. There is soft tissue swelling surrounding the elbow. No significant joint effusion. Mild degenerative osteophytes are seen. Small olecranon spur is present. IMPRESSION: 1. No acute fracture or dislocation. 2. Soft tissue swelling surrounding the elbow. Electronically Signed   By: Ronney Asters M.D.   On: 09/03/2021 19:34    Procedures Procedures    Medications Ordered in ED Medications  fentaNYL (SUBLIMAZE) injection 100 mcg (100 mcg Intravenous Given 09/03/21 1832)  HYDROcodone-acetaminophen (NORCO/VICODIN) 5-325 MG per tablet 1 tablet (1 tablet Oral Given 09/03/21 2158)    ED Course/ Medical Decision Making/ A&P Clinical Course as of 09/03/21 2217  Wed Sep 03, 2021  2023 DG Hip Unilat With Pelvis 2-3 Views Left [EW]    Clinical Course User Index [EW] Daleen Bo, MD                           Medical Decision Making Patient presenting with vomiting last night.  It did not prevent him from going to dialysis today.  Amount and/or Complexity of Data Reviewed Independent Historian:     Details: He is a cogent historian Labs: ordered.    Details: CBC, metabolic panel -- normal except creatinine high, calcium low, GFR low, hemoglobin low Radiology: ordered and independent interpretation performed. Decision-making details documented in ED Course.    Details: Radiography of the  left ribs and chest, left elbow, pelvis, left hip and left knee.  No acute fracture or dislocation.  Risk Prescription drug management. Decision regarding hospitalization. Risk Details: Patient presenting with fall.  He has multiple contusions but no evidence for fracture on imaging.  He appears stable hemodynamically and from a renal perspective.  He dialyzed today.  He is not heart failure or have an oxygen requirement.  Potassium is normal.  Wound of the left elbow was managed with dressing change by me.  He does not require suture closure.  He does not require hospitalization at this time.           Final Clinical Impression(s) / ED Diagnoses Final diagnoses:  Contusion, multiple sites  Fall, initial encounter  Abrasion of left upper extremity, initial encounter    Rx / DC Orders ED Discharge Orders     None         Daleen Bo, MD 09/03/21 2221

## 2021-09-03 NOTE — ED Notes (Signed)
Patient back from radiology.

## 2021-09-03 NOTE — ED Notes (Signed)
Attempted to call report to [facility], no answer at this time.

## 2021-09-03 NOTE — ED Notes (Signed)
Report given to [place] M, Therapist, sports. Patient waiting for transport back to facility.

## 2021-09-03 NOTE — Discharge Instructions (Signed)
There was no serious injury from the fall last night.  Use your regular medication for pain at your facility.  We cleaned the wound of your left elbow, and put a dressing on it.  Have your nurse change the dressing once a day, clean it well and reapply with a nonadherent gauze dressing.  Gradually advance your activity.  Continue your regular medications and treatments.  Return here if needed.

## 2021-09-04 DIAGNOSIS — G894 Chronic pain syndrome: Secondary | ICD-10-CM | POA: Diagnosis not present

## 2021-09-04 DIAGNOSIS — E1122 Type 2 diabetes mellitus with diabetic chronic kidney disease: Secondary | ICD-10-CM | POA: Diagnosis not present

## 2021-09-04 DIAGNOSIS — M2550 Pain in unspecified joint: Secondary | ICD-10-CM | POA: Diagnosis not present

## 2021-09-04 DIAGNOSIS — L299 Pruritus, unspecified: Secondary | ICD-10-CM | POA: Diagnosis not present

## 2021-09-04 DIAGNOSIS — Z7401 Bed confinement status: Secondary | ICD-10-CM | POA: Diagnosis not present

## 2021-09-04 DIAGNOSIS — J449 Chronic obstructive pulmonary disease, unspecified: Secondary | ICD-10-CM | POA: Diagnosis not present

## 2021-09-04 DIAGNOSIS — N186 End stage renal disease: Secondary | ICD-10-CM | POA: Diagnosis not present

## 2021-09-04 DIAGNOSIS — R262 Difficulty in walking, not elsewhere classified: Secondary | ICD-10-CM | POA: Diagnosis not present

## 2021-09-04 DIAGNOSIS — I679 Cerebrovascular disease, unspecified: Secondary | ICD-10-CM | POA: Diagnosis not present

## 2021-09-04 DIAGNOSIS — K861 Other chronic pancreatitis: Secondary | ICD-10-CM | POA: Diagnosis not present

## 2021-09-04 DIAGNOSIS — I10 Essential (primary) hypertension: Secondary | ICD-10-CM | POA: Diagnosis not present

## 2021-09-04 DIAGNOSIS — L89134 Pressure ulcer of right lower back, stage 4: Secondary | ICD-10-CM | POA: Diagnosis not present

## 2021-09-04 DIAGNOSIS — W19XXXD Unspecified fall, subsequent encounter: Secondary | ICD-10-CM | POA: Diagnosis not present

## 2021-09-05 DIAGNOSIS — E1122 Type 2 diabetes mellitus with diabetic chronic kidney disease: Secondary | ICD-10-CM | POA: Diagnosis not present

## 2021-09-05 DIAGNOSIS — K861 Other chronic pancreatitis: Secondary | ICD-10-CM | POA: Diagnosis not present

## 2021-09-05 DIAGNOSIS — J449 Chronic obstructive pulmonary disease, unspecified: Secondary | ICD-10-CM | POA: Diagnosis not present

## 2021-09-05 DIAGNOSIS — N186 End stage renal disease: Secondary | ICD-10-CM | POA: Diagnosis not present

## 2021-09-05 DIAGNOSIS — I679 Cerebrovascular disease, unspecified: Secondary | ICD-10-CM | POA: Diagnosis not present

## 2021-09-05 DIAGNOSIS — R262 Difficulty in walking, not elsewhere classified: Secondary | ICD-10-CM | POA: Diagnosis not present

## 2021-09-07 DIAGNOSIS — K861 Other chronic pancreatitis: Secondary | ICD-10-CM | POA: Diagnosis not present

## 2021-09-07 DIAGNOSIS — N186 End stage renal disease: Secondary | ICD-10-CM | POA: Diagnosis not present

## 2021-09-07 DIAGNOSIS — I679 Cerebrovascular disease, unspecified: Secondary | ICD-10-CM | POA: Diagnosis not present

## 2021-09-07 DIAGNOSIS — E1122 Type 2 diabetes mellitus with diabetic chronic kidney disease: Secondary | ICD-10-CM | POA: Diagnosis not present

## 2021-09-07 DIAGNOSIS — R262 Difficulty in walking, not elsewhere classified: Secondary | ICD-10-CM | POA: Diagnosis not present

## 2021-09-07 DIAGNOSIS — J449 Chronic obstructive pulmonary disease, unspecified: Secondary | ICD-10-CM | POA: Diagnosis not present

## 2021-09-09 ENCOUNTER — Other Ambulatory Visit: Payer: Self-pay

## 2021-09-09 ENCOUNTER — Ambulatory Visit (INDEPENDENT_AMBULATORY_CARE_PROVIDER_SITE_OTHER): Payer: Medicare Other | Admitting: Vascular Surgery

## 2021-09-09 ENCOUNTER — Encounter: Payer: Self-pay | Admitting: Vascular Surgery

## 2021-09-09 VITALS — BP 160/67 | HR 99 | Temp 98.0°F | Resp 20

## 2021-09-09 DIAGNOSIS — Z992 Dependence on renal dialysis: Secondary | ICD-10-CM | POA: Diagnosis not present

## 2021-09-09 DIAGNOSIS — E1122 Type 2 diabetes mellitus with diabetic chronic kidney disease: Secondary | ICD-10-CM | POA: Diagnosis not present

## 2021-09-09 DIAGNOSIS — I10 Essential (primary) hypertension: Secondary | ICD-10-CM | POA: Diagnosis not present

## 2021-09-09 DIAGNOSIS — I679 Cerebrovascular disease, unspecified: Secondary | ICD-10-CM | POA: Diagnosis not present

## 2021-09-09 DIAGNOSIS — R262 Difficulty in walking, not elsewhere classified: Secondary | ICD-10-CM | POA: Diagnosis not present

## 2021-09-09 DIAGNOSIS — J449 Chronic obstructive pulmonary disease, unspecified: Secondary | ICD-10-CM | POA: Diagnosis not present

## 2021-09-09 DIAGNOSIS — K861 Other chronic pancreatitis: Secondary | ICD-10-CM | POA: Diagnosis not present

## 2021-09-09 DIAGNOSIS — G894 Chronic pain syndrome: Secondary | ICD-10-CM | POA: Diagnosis not present

## 2021-09-09 DIAGNOSIS — F419 Anxiety disorder, unspecified: Secondary | ICD-10-CM | POA: Diagnosis not present

## 2021-09-09 DIAGNOSIS — N186 End stage renal disease: Secondary | ICD-10-CM

## 2021-09-09 NOTE — H&P (View-Only) (Signed)
[facility] [place] PROGRESS NOTE  ASSESSMENT / PLAN: Wayne Meadows is a 87 y.o. male with end-stage renal disease dialyzing Monday, Wednesday, and Friday via a right arm brachiocephalic arteriovenous fistula created for him 04/03/21.  He was initially lost to follow-up with a wound problem in his antecubital fossa.  Thankfully this healed with wound care.  Unfortunately, he has developed a traumatic cannulation of his right upper extremity fistula with a tense fluid collection in the upper arm which is causing some early ulceration of the skin.  I think this needs to be incised and drained in the operating room.  We will plan to do this in the near future on a nondialysis day  SUBJECTIVE: Returns to clinic for evaluation of his fistula.  I had initially lost him to follow-up in March when he developed dehiscence of a brachiocephalic arteriovenous fistula incision.  This thankfully healed with local wound care.  He returns  OBJECTIVE: BP (!) 160/67 (BP Location: Left Arm, Patient Position: Sitting, Cuff Size: Normal)   Pulse 99   Temp 98 F (36.7 C)   Resp 20   SpO2 93%   No acute distress Regular rate and rhythm Unlabored breathing Right BC AVF with good thrill Large tense fluid collection in mid arm with early overlaying ulceration     Latest Ref Rng & Units 09/03/2021    6:34 PM 08/20/2021    4:01 AM 08/19/2021    5:32 AM  CBC  WBC 4.0 - 10.5 K/uL 10.1  9.6  9.6   Hemoglobin 13.0 - 17.0 g/dL 11.1  12.2  12.6   Hematocrit 39.0 - 52.0 % 33.9  38.7  39.1   Platelets 150 - 400 K/uL 211  251  297         Latest Ref Rng & Units 09/03/2021    6:34 PM 08/20/2021    4:01 AM 08/19/2021    5:32 AM  CMP  Glucose 70 - 99 mg/dL 86  91  95   BUN 8 - 23 mg/dL 22  76  59   Creatinine 0.61 - 1.24 mg/dL 2.94  8.01  6.31   Sodium 135 - 145 mmol/L 139  138  139   Potassium 3.5 - 5.1 mmol/L 4.3  4.9  5.2   Chloride 98 - 111 mmol/L 99  96  97   CO2 22 - 32 mmol/L '27   26  23   '$ Calcium 8.9 - 10.3 mg/dL 8.6  8.7  8.9    Raena Pau N. Stanford Breed, MD [facility] Phone Number: [phone] 09/09/2021 6:10 PM

## 2021-09-09 NOTE — Progress Notes (Signed)
[facility] [place] PROGRESS NOTE  ASSESSMENT / PLAN: Wayne Meadows is a 87 y.o. male with end-stage renal disease dialyzing Monday, Wednesday, and Friday via a right arm brachiocephalic arteriovenous fistula created for him 04/03/21.  He was initially lost to follow-up with a wound problem in his antecubital fossa.  Thankfully this healed with wound care.  Unfortunately, he has developed a traumatic cannulation of his right upper extremity fistula with a tense fluid collection in the upper arm which is causing some early ulceration of the skin.  I think this needs to be incised and drained in the operating room.  We will plan to do this in the near future on a nondialysis day  SUBJECTIVE: Returns to clinic for evaluation of his fistula.  I had initially lost him to follow-up in March when he developed dehiscence of a brachiocephalic arteriovenous fistula incision.  This thankfully healed with local wound care.  He returns  OBJECTIVE: BP (!) 160/67 (BP Location: Left Arm, Patient Position: Sitting, Cuff Size: Normal)   Pulse 99   Temp 98 F (36.7 C)   Resp 20   SpO2 93%   No acute distress Regular rate and rhythm Unlabored breathing Right BC AVF with good thrill Large tense fluid collection in mid arm with early overlaying ulceration     Latest Ref Rng & Units 09/03/2021    6:34 PM 08/20/2021    4:01 AM 08/19/2021    5:32 AM  CBC  WBC 4.0 - 10.5 K/uL 10.1  9.6  9.6   Hemoglobin 13.0 - 17.0 g/dL 11.1  12.2  12.6   Hematocrit 39.0 - 52.0 % 33.9  38.7  39.1   Platelets 150 - 400 K/uL 211  251  297         Latest Ref Rng & Units 09/03/2021    6:34 PM 08/20/2021    4:01 AM 08/19/2021    5:32 AM  CMP  Glucose 70 - 99 mg/dL 86  91  95   BUN 8 - 23 mg/dL 22  76  59   Creatinine 0.61 - 1.24 mg/dL 2.94  8.01  6.31   Sodium 135 - 145 mmol/L 139  138  139   Potassium 3.5 - 5.1 mmol/L 4.3  4.9  5.2   Chloride 98 - 111 mmol/L 99  96  97   CO2 22 - 32 mmol/L '27   26  23   '$ Calcium 8.9 - 10.3 mg/dL 8.6  8.7  8.9    Jonel Sick N. Stanford Breed, MD [facility] Phone Number: [phone] 09/09/2021 6:10 PM

## 2021-09-10 ENCOUNTER — Other Ambulatory Visit: Payer: Self-pay

## 2021-09-10 DIAGNOSIS — N186 End stage renal disease: Secondary | ICD-10-CM | POA: Diagnosis not present

## 2021-09-10 DIAGNOSIS — I679 Cerebrovascular disease, unspecified: Secondary | ICD-10-CM | POA: Diagnosis not present

## 2021-09-10 DIAGNOSIS — E1122 Type 2 diabetes mellitus with diabetic chronic kidney disease: Secondary | ICD-10-CM | POA: Diagnosis not present

## 2021-09-10 DIAGNOSIS — R262 Difficulty in walking, not elsewhere classified: Secondary | ICD-10-CM | POA: Diagnosis not present

## 2021-09-10 DIAGNOSIS — J449 Chronic obstructive pulmonary disease, unspecified: Secondary | ICD-10-CM | POA: Diagnosis not present

## 2021-09-10 DIAGNOSIS — K861 Other chronic pancreatitis: Secondary | ICD-10-CM | POA: Diagnosis not present

## 2021-09-10 NOTE — Progress Notes (Signed)
[name] confirmed receipt of the pre-op instructions.

## 2021-09-10 NOTE — Progress Notes (Signed)
Spoke the nurse [name] from [facility] [phone], [phone] [phone], to inform her of the pt's surgery and to have her call back upon receipt of the pre-op instructions. She denies the pt having covid/flu symptoms, or having to be tested in the last 2 weeks or positive in the last 90 days. She states the pt is AAOx4, and is currently having a dialysis session. Awaiting confirmation. Please see pre-op instructions for medication list and arrival times.

## 2021-09-10 NOTE — Pre-Procedure Instructions (Signed)
    Wayne Meadows  09/10/2021     Surgical Instructions   Your procedure is scheduled on Thurs., September 11, 2021 from 2:25PM-3:55PM .  Report to [facility] Main Entrance "A" at 11:55 A.M., then check in with the Admitting office.  Call this number if you have problems the morning of surgery:  [phone]   Remember:  Do not eat or drink after midnight on July 19th    Take these medicines the morning of surgery with A SIP OF WATER: Please provide the exact times the medication was given for anesthesia safety. Aspirin EC BusPIRone (BUSPAR) CloNIDine (CATAPRES) Diltiazem (CARDIZEM CD) GuaiFENesin  Ipratropium-Albuterol (COMBIVENT) Levothyroxine (SYNTHROID) Naphazoline-glycerin (CLEAR EYES REDNESS) Pantoprazole (PROTONIX) PredniSONE (DELTASONE) Tamsulosin (FLOMAX)  If Needed: Acetaminophen (TYLENOL) Albuterol (VENTOLIN HFA) HydrALAZINE (APRESOLINE) HydrOXYzine (ATARAX) Ondansetron (ZOFRAN) OxyCODONE (OXY IR/ROXICODONE)  As of today, STOP taking any Aspirin and Aspirin containing products (unless otherwise instructed by your surgeon) Aleve, Naproxen, Ibuprofen, Motrin, Advil, Goody's, BC's, all herbal medications, fish oil, and all vitamins.             Day of Surgery: Do not wear jewelry. Do not wear lotions, powders, colognes, or deodorant. Do not shave 48 hours prior to surgery. Men may shave face and neck. Do not bring valuables to the hospital.             [facility] is not responsible for any belongings or valuables.  Do NOT Smoke (Tobacco/Vaping)  24 hours prior to your procedure  If you use a CPAP at night, you may bring your mask and machine for your overnight stay.   Contacts, glasses, hearing aids, dentures or partials may not be worn into surgery, please bring cases for these belongings   For patients admitted to the hospital, discharge time will be determined by your treatment team.   Patients discharged the day of surgery will not be allowed to drive  home, and someone needs to stay with them for 24 hours.  Special instructions:    Oral Hygiene is also important to reduce your risk of infection.  Remember - BRUSH YOUR TEETH THE MORNING OF SURGERY WITH YOUR REGULAR TOOTHPASTE  [facility]- Preparing For Surgery  Before surgery, you can play an important role. Because skin is not sterile, your skin needs to be as free of germs as possible. You can reduce the number of germs on your skin by washing with an Antibacterial Soap before surgery.    Please follow these instructions carefully.   Shower the NIGHT BEFORE SURGERY and the MORNING OF SURGERY with an Antibacterial Soap.   Pat yourself dry with a CLEAN TOWEL.  Wear CLEAN PAJAMAS to bed the night before surgery  Place CLEAN SHEETS on your bed the night before your surgery  DO NOT SLEEP WITH PETS.  Reminder: Take a shower with Antibacterial soap. Wear Clean/Comfortable clothing the morning of surgery Do not apply any deodorants/lotions.   Remember to brush your teeth WITH YOUR REGULAR TOOTHPASTE.  If you have been in contact with anyone that has tested positive in the last 10 days please notify you surgeon.  Notify your provider:  if you develop a fever of 100.4 or greater, sneezing, cough, sore throat, shortness of breath or body aches.  Please read over the following fact sheets that you were given.

## 2021-09-11 ENCOUNTER — Ambulatory Visit (HOSPITAL_BASED_OUTPATIENT_CLINIC_OR_DEPARTMENT_OTHER): Payer: Medicare Other | Admitting: Anesthesiology

## 2021-09-11 ENCOUNTER — Encounter (HOSPITAL_COMMUNITY)
Admission: RE | Disposition: A | Discharge status: Skilled Nursing Facility | Payer: Self-pay | Source: Home / Self Care | Attending: Vascular Surgery

## 2021-09-11 ENCOUNTER — Ambulatory Visit (HOSPITAL_COMMUNITY): Payer: Medicare Other | Admitting: Anesthesiology

## 2021-09-11 ENCOUNTER — Observation Stay (HOSPITAL_COMMUNITY)
Admission: RE | Admit: 2021-09-11 | Discharge: 2021-09-12 | Disposition: A | Discharge status: Skilled Nursing Facility | Payer: Medicare Other | Attending: Vascular Surgery | Admitting: Vascular Surgery

## 2021-09-11 ENCOUNTER — Other Ambulatory Visit: Payer: Self-pay

## 2021-09-11 DIAGNOSIS — T82898A Other specified complication of vascular prosthetic devices, implants and grafts, initial encounter: Principal | ICD-10-CM | POA: Insufficient documentation

## 2021-09-11 DIAGNOSIS — I2510 Atherosclerotic heart disease of native coronary artery without angina pectoris: Secondary | ICD-10-CM | POA: Insufficient documentation

## 2021-09-11 DIAGNOSIS — M7981 Nontraumatic hematoma of soft tissue: Secondary | ICD-10-CM | POA: Diagnosis not present

## 2021-09-11 DIAGNOSIS — Z955 Presence of coronary angioplasty implant and graft: Secondary | ICD-10-CM | POA: Diagnosis not present

## 2021-09-11 DIAGNOSIS — I25119 Atherosclerotic heart disease of native coronary artery with unspecified angina pectoris: Secondary | ICD-10-CM | POA: Diagnosis not present

## 2021-09-11 DIAGNOSIS — Z992 Dependence on renal dialysis: Secondary | ICD-10-CM

## 2021-09-11 DIAGNOSIS — I120 Hypertensive chronic kidney disease with stage 5 chronic kidney disease or end stage renal disease: Secondary | ICD-10-CM | POA: Diagnosis not present

## 2021-09-11 DIAGNOSIS — Z87891 Personal history of nicotine dependence: Secondary | ICD-10-CM

## 2021-09-11 DIAGNOSIS — J449 Chronic obstructive pulmonary disease, unspecified: Secondary | ICD-10-CM | POA: Diagnosis not present

## 2021-09-11 DIAGNOSIS — Y838 Other surgical procedures as the cause of abnormal reaction of the patient, or of later complication, without mention of misadventure at the time of the procedure: Secondary | ICD-10-CM | POA: Insufficient documentation

## 2021-09-11 DIAGNOSIS — L7632 Postprocedural hematoma of skin and subcutaneous tissue following other procedure: Secondary | ICD-10-CM | POA: Diagnosis not present

## 2021-09-11 DIAGNOSIS — Z85528 Personal history of other malignant neoplasm of kidney: Secondary | ICD-10-CM | POA: Insufficient documentation

## 2021-09-11 DIAGNOSIS — Z8673 Personal history of transient ischemic attack (TIA), and cerebral infarction without residual deficits: Secondary | ICD-10-CM | POA: Insufficient documentation

## 2021-09-11 DIAGNOSIS — F419 Anxiety disorder, unspecified: Secondary | ICD-10-CM | POA: Diagnosis not present

## 2021-09-11 DIAGNOSIS — N186 End stage renal disease: Secondary | ICD-10-CM | POA: Insufficient documentation

## 2021-09-11 DIAGNOSIS — I252 Old myocardial infarction: Secondary | ICD-10-CM | POA: Diagnosis not present

## 2021-09-11 DIAGNOSIS — E039 Hypothyroidism, unspecified: Secondary | ICD-10-CM | POA: Diagnosis not present

## 2021-09-11 HISTORY — PX: INCISION AND DRAINAGE: SHX5863

## 2021-09-11 LAB — CBC
HCT: 33.4 % — ABNORMAL LOW (ref 39.0–52.0)
Hemoglobin: 10.7 g/dL — ABNORMAL LOW (ref 13.0–17.0)
MCH: 29.5 pg (ref 26.0–34.0)
MCHC: 32 g/dL (ref 30.0–36.0)
MCV: 92 fL (ref 80.0–100.0)
Platelets: 284 10*3/uL (ref 150–400)
RBC: 3.63 MIL/uL — ABNORMAL LOW (ref 4.22–5.81)
RDW: 17.2 % — ABNORMAL HIGH (ref 11.5–15.5)
WBC: 10.3 10*3/uL (ref 4.0–10.5)
nRBC: 0 % (ref 0.0–0.2)

## 2021-09-11 LAB — POCT I-STAT, CHEM 8
BUN: 46 mg/dL — ABNORMAL HIGH (ref 8–23)
Calcium, Ion: 1.12 mmol/L — ABNORMAL LOW (ref 1.15–1.40)
Chloride: 100 mmol/L (ref 98–111)
Creatinine, Ser: 5.1 mg/dL — ABNORMAL HIGH (ref 0.61–1.24)
Glucose, Bld: 94 mg/dL (ref 70–99)
HCT: 33 % — ABNORMAL LOW (ref 39.0–52.0)
Hemoglobin: 11.2 g/dL — ABNORMAL LOW (ref 13.0–17.0)
Potassium: 4.7 mmol/L (ref 3.5–5.1)
Sodium: 138 mmol/L (ref 135–145)
TCO2: 28 mmol/L (ref 22–32)

## 2021-09-11 LAB — CREATININE, SERUM
Creatinine, Ser: 5.64 mg/dL — ABNORMAL HIGH (ref 0.61–1.24)
GFR, Estimated: 9 mL/min — ABNORMAL LOW (ref >60)

## 2021-09-11 LAB — SURGICAL PCR SCREEN
MRSA, PCR: POSITIVE — AB
Staphylococcus aureus: POSITIVE — AB

## 2021-09-11 SURGERY — INCISION AND DRAINAGE
Anesthesia: General | Laterality: Right

## 2021-09-11 MED ORDER — FENTANYL CITRATE (PF) 250 MCG/5ML IJ SOLN
INTRAMUSCULAR | Status: AC
  Filled 2021-09-11: qty 5

## 2021-09-11 MED ORDER — PROSOURCE PLUS PO LIQD
30.0000 mL | Freq: Two times a day (BID) | ORAL | Status: DC
Start: 2021-09-12 — End: 2021-09-12
  Administered 2021-09-12 (×2): 30 mL via ORAL
  Filled 2021-09-11 (×3): qty 30

## 2021-09-11 MED ORDER — PRO-STAT 64 PO LIQD
30.0000 mL | Freq: Every evening | ORAL | Status: DC
Start: 2021-09-11 — End: 2021-09-12
  Administered 2021-09-11: 30 mL via ORAL

## 2021-09-11 MED ORDER — CHLORHEXIDINE GLUCONATE CLOTH 2 % EX PADS: 6.0000 | MEDICATED_PAD | Freq: Every day | CUTANEOUS | Status: DC

## 2021-09-11 MED ORDER — HYDRALAZINE HCL 20 MG/ML IJ SOLN: 5.0000 mg | INTRAMUSCULAR | Status: DC | PRN

## 2021-09-11 MED ORDER — BUSPIRONE HCL 5 MG PO TABS
5.0000 mg | ORAL_TABLET | Freq: Two times a day (BID) | ORAL | Status: DC
  Administered 2021-09-11 – 2021-09-12 (×2): 5 mg via ORAL
  Filled 2021-09-11 (×2): qty 1

## 2021-09-11 MED ORDER — HYDROXYZINE HCL 10 MG PO TABS
10.0000 mg | ORAL_TABLET | Freq: Three times a day (TID) | ORAL | Status: DC | PRN
Start: 2021-09-11 — End: 2021-09-12
  Filled 2021-09-11 (×2): qty 1

## 2021-09-11 MED ORDER — PREDNISONE 20 MG PO TABS: 20.0000 mg | ORAL_TABLET | ORAL | Status: DC

## 2021-09-11 MED ORDER — FENTANYL CITRATE (PF) 250 MCG/5ML IJ SOLN
INTRAMUSCULAR | Status: DC | PRN
  Administered 2021-09-11: 50 ug via INTRAVENOUS
  Administered 2021-09-11: 100 ug via INTRAVENOUS

## 2021-09-11 MED ORDER — PREDNISONE 20 MG PO TABS: 20.0000 mg | ORAL_TABLET | Freq: Every evening | ORAL | Status: DC

## 2021-09-11 MED ORDER — CHLORHEXIDINE GLUCONATE 4 % EX LIQD: 60.0000 mL | Freq: Once | CUTANEOUS | Status: DC

## 2021-09-11 MED ORDER — BISACODYL 10 MG RE SUPP
10.0000 mg | Freq: Every day | RECTAL | Status: DC | PRN
Start: 2021-09-11 — End: 2021-09-12

## 2021-09-11 MED ORDER — HYDRALAZINE HCL 10 MG PO TABS: 10.0000 mg | ORAL_TABLET | Freq: Three times a day (TID) | ORAL | Status: DC | PRN

## 2021-09-11 MED ORDER — GUAIFENESIN-DM 100-10 MG/5ML PO SYRP: 15.0000 mL | ORAL_SOLUTION | ORAL | Status: DC | PRN

## 2021-09-11 MED ORDER — NEPRO/CARBSTEADY PO LIQD
237.0000 mL | Freq: Three times a day (TID) | ORAL | Status: DC
  Administered 2021-09-12: 237 mL via ORAL

## 2021-09-11 MED ORDER — SODIUM CHLORIDE 0.9 % IV SOLN: 250.0000 mL | INTRAVENOUS | Status: DC | PRN

## 2021-09-11 MED ORDER — LEVOTHYROXINE SODIUM 50 MCG PO TABS
50.0000 ug | ORAL_TABLET | Freq: Every day | ORAL | Status: DC
  Administered 2021-09-12: 50 ug via ORAL
  Filled 2021-09-11: qty 1

## 2021-09-11 MED ORDER — ACETAMINOPHEN 325 MG PO TABS: 650.0000 mg | ORAL_TABLET | Freq: Four times a day (QID) | ORAL | Status: DC | PRN

## 2021-09-11 MED ORDER — ONDANSETRON HCL 4 MG PO TABS: 4.0000 mg | ORAL_TABLET | Freq: Every day | ORAL | Status: DC | PRN

## 2021-09-11 MED ORDER — ONDANSETRON HCL 4 MG/2ML IJ SOLN
INTRAMUSCULAR | Status: DC | PRN
  Administered 2021-09-11: 4 mg via INTRAVENOUS

## 2021-09-11 MED ORDER — MELATONIN 5 MG PO TABS
5.0000 mg | ORAL_TABLET | Freq: Every day | ORAL | Status: DC
  Administered 2021-09-11: 5 mg via ORAL
  Filled 2021-09-11: qty 1

## 2021-09-11 MED ORDER — PHENOL 1.4 % MT LIQD: 1.0000 | OROMUCOSAL | Status: DC | PRN

## 2021-09-11 MED ORDER — CHLORHEXIDINE GLUCONATE 0.12 % MT SOLN
OROMUCOSAL | Status: AC
  Administered 2021-09-11: 15 mL
  Filled 2021-09-11: qty 15

## 2021-09-11 MED ORDER — HYDROMORPHONE HCL 1 MG/ML IJ SOLN: 0.5000 mg | INTRAMUSCULAR | Status: DC | PRN

## 2021-09-11 MED ORDER — LANTHANUM CARBONATE 500 MG PO CHEW
1000.0000 mg | CHEWABLE_TABLET | Freq: Three times a day (TID) | ORAL | Status: DC
  Administered 2021-09-12 (×3): 1000 mg via ORAL
  Filled 2021-09-11 (×3): qty 2

## 2021-09-11 MED ORDER — IPRATROPIUM-ALBUTEROL 0.5-2.5 (3) MG/3ML IN SOLN
3.0000 mL | RESPIRATORY_TRACT | Status: DC | PRN
  Filled 2021-09-11: qty 3

## 2021-09-11 MED ORDER — SODIUM CHLORIDE 0.9% FLUSH: 3.0000 mL | INTRAVENOUS | Status: DC | PRN

## 2021-09-11 MED ORDER — ASPIRIN 81 MG PO TBEC
81.0000 mg | DELAYED_RELEASE_TABLET | Freq: Every day | ORAL | Status: DC
Start: 2021-09-11 — End: 2021-09-12
  Administered 2021-09-11 – 2021-09-12 (×2): 81 mg via ORAL
  Filled 2021-09-11 (×2): qty 1

## 2021-09-11 MED ORDER — ONDANSETRON HCL 4 MG/2ML IJ SOLN: 4.0000 mg | Freq: Four times a day (QID) | INTRAMUSCULAR | Status: DC | PRN

## 2021-09-11 MED ORDER — FENTANYL CITRATE (PF) 100 MCG/2ML IJ SOLN
INTRAMUSCULAR | Status: AC
  Filled 2021-09-11: qty 2

## 2021-09-11 MED ORDER — CLONIDINE HCL 0.1 MG PO TABS
0.1000 mg | ORAL_TABLET | Freq: Every day | ORAL | Status: DC
  Administered 2021-09-11 – 2021-09-12 (×2): 0.1 mg via ORAL
  Filled 2021-09-11 (×2): qty 1

## 2021-09-11 MED ORDER — PROPOFOL 500 MG/50ML IV EMUL
INTRAVENOUS | Status: DC | PRN
  Administered 2021-09-11: 130 ug/kg/min via INTRAVENOUS

## 2021-09-11 MED ORDER — HEPARIN SODIUM (PORCINE) 5000 UNIT/ML IJ SOLN
5000.0000 [IU] | Freq: Three times a day (TID) | INTRAMUSCULAR | Status: DC
Start: 2021-09-12 — End: 2021-09-12
  Administered 2021-09-12 (×2): 5000 [IU] via SUBCUTANEOUS
  Filled 2021-09-11 (×2): qty 1

## 2021-09-11 MED ORDER — POLYVINYL ALCOHOL 1.4 % OP SOLN
1.0000 [drp] | Freq: Three times a day (TID) | OPHTHALMIC | Status: DC
  Administered 2021-09-12 (×3): 1 [drp] via OPHTHALMIC
  Filled 2021-09-11 (×2): qty 15

## 2021-09-11 MED ORDER — FENTANYL CITRATE (PF) 100 MCG/2ML IJ SOLN
25.0000 ug | INTRAMUSCULAR | Status: DC | PRN
  Administered 2021-09-11: 25 ug via INTRAVENOUS

## 2021-09-11 MED ORDER — FERROUS SULFATE 325 (65 FE) MG PO TABS
325.0000 mg | ORAL_TABLET | ORAL | Status: DC
  Administered 2021-09-12: 325 mg via ORAL
  Filled 2021-09-11: qty 1

## 2021-09-11 MED ORDER — PREDNISOLONE SODIUM PHOSPHATE 10 MG PO TBDP: 10.0000 mg | ORAL_TABLET | ORAL | Status: DC

## 2021-09-11 MED ORDER — LIDOCAINE 2% (20 MG/ML) 5 ML SYRINGE
INTRAMUSCULAR | Status: DC | PRN
  Administered 2021-09-11: 60 mg via INTRAVENOUS

## 2021-09-11 MED ORDER — ALBUTEROL SULFATE (2.5 MG/3ML) 0.083% IN NEBU
2.5000 mg | INHALATION_SOLUTION | Freq: Four times a day (QID) | RESPIRATORY_TRACT | Status: DC | PRN
Start: 2021-09-11 — End: 2021-09-12

## 2021-09-11 MED ORDER — NAPHAZOLINE-GLYCERIN 0.012-0.25 % OP SOLN: 1.0000 [drp] | Freq: Three times a day (TID) | OPHTHALMIC | Status: DC

## 2021-09-11 MED ORDER — PRO-STAT SUGAR FREE PO LIQD
30.0000 mL | Freq: Every evening | ORAL | Status: DC
  Filled 2021-09-11: qty 30

## 2021-09-11 MED ORDER — BACITRACIN-NEOMYCIN-POLYMYXIN OINTMENT TUBE
1.0000 | TOPICAL_OINTMENT | Freq: Three times a day (TID) | CUTANEOUS | Status: DC
Start: 2021-09-11 — End: 2021-09-12
  Administered 2021-09-12 (×2): 1 via TOPICAL
  Filled 2021-09-11 (×2): qty 14

## 2021-09-11 MED ORDER — 0.9 % SODIUM CHLORIDE (POUR BTL) OPTIME
TOPICAL | Status: DC | PRN
  Administered 2021-09-11: 1000 mL

## 2021-09-11 MED ORDER — CEFAZOLIN SODIUM-DEXTROSE 2-3 GM-%(50ML) IV SOLR
INTRAVENOUS | Status: DC | PRN
  Administered 2021-09-11: 2 g via INTRAVENOUS

## 2021-09-11 MED ORDER — NOVASOURCE RENAL PO LIQD: 237.0000 mL | Freq: Two times a day (BID) | ORAL | Status: DC

## 2021-09-11 MED ORDER — PRAVASTATIN SODIUM 40 MG PO TABS
40.0000 mg | ORAL_TABLET | Freq: Every day | ORAL | Status: DC
  Administered 2021-09-11: 40 mg via ORAL
  Filled 2021-09-11: qty 1

## 2021-09-11 MED ORDER — ACETAMINOPHEN 500 MG PO TABS
1000.0000 mg | ORAL_TABLET | Freq: Once | ORAL | Status: AC
Start: 2021-09-11 — End: 2021-09-11
  Administered 2021-09-11: 1000 mg via ORAL
  Filled 2021-09-11: qty 2

## 2021-09-11 MED ORDER — SODIUM CHLORIDE 0.9% FLUSH
3.0000 mL | Freq: Two times a day (BID) | INTRAVENOUS | Status: DC
  Administered 2021-09-11 – 2021-09-12 (×2): 3 mL via INTRAVENOUS

## 2021-09-11 MED ORDER — HYDROCODONE-ACETAMINOPHEN 5-325 MG PO TABS
1.0000 | ORAL_TABLET | ORAL | Status: DC | PRN
  Administered 2021-09-11 – 2021-09-12 (×3): 2 via ORAL
  Filled 2021-09-11 (×3): qty 2

## 2021-09-11 MED ORDER — DOCUSATE SODIUM 100 MG PO CAPS
100.0000 mg | ORAL_CAPSULE | Freq: Two times a day (BID) | ORAL | Status: DC
  Administered 2021-09-11 – 2021-09-12 (×2): 100 mg via ORAL
  Filled 2021-09-11 (×2): qty 1

## 2021-09-11 MED ORDER — PANTOPRAZOLE SODIUM 40 MG PO TBEC
40.0000 mg | DELAYED_RELEASE_TABLET | Freq: Two times a day (BID) | ORAL | Status: DC
  Administered 2021-09-11 – 2021-09-12 (×2): 40 mg via ORAL
  Filled 2021-09-11 (×3): qty 1

## 2021-09-11 MED ORDER — SODIUM CHLORIDE 0.9 % IV SOLN: INTRAVENOUS | Status: DC

## 2021-09-11 MED ORDER — PANTOPRAZOLE SODIUM 40 MG PO TBEC: 40.0000 mg | DELAYED_RELEASE_TABLET | Freq: Every day | ORAL | Status: DC

## 2021-09-11 MED ORDER — MUPIROCIN 2 % EX OINT
1.0000 | TOPICAL_OINTMENT | Freq: Two times a day (BID) | CUTANEOUS | Status: DC
  Filled 2021-09-11: qty 22

## 2021-09-11 MED ORDER — TAMSULOSIN HCL 0.4 MG PO CAPS
0.4000 mg | ORAL_CAPSULE | Freq: Every day | ORAL | Status: DC
Start: 2021-09-11 — End: 2021-09-12
  Administered 2021-09-11 – 2021-09-12 (×2): 0.4 mg via ORAL
  Filled 2021-09-11 (×2): qty 1

## 2021-09-11 MED ORDER — POTASSIUM CHLORIDE CRYS ER 20 MEQ PO TBCR
20.0000 meq | EXTENDED_RELEASE_TABLET | Freq: Once | ORAL | Status: AC
  Administered 2021-09-11: 20 meq via ORAL
  Filled 2021-09-11: qty 1

## 2021-09-11 MED ORDER — SODIUM CHLORIDE 0.9 % IV SOLN: INTRAVENOUS | Status: DC | PRN

## 2021-09-11 MED ORDER — IPRATROPIUM-ALBUTEROL 0.5-2.5 (3) MG/3ML IN SOLN
3.0000 mL | Freq: Four times a day (QID) | RESPIRATORY_TRACT | Status: DC
Start: 2021-09-11 — End: 2021-09-12
  Administered 2021-09-12 (×4): 3 mL via RESPIRATORY_TRACT
  Filled 2021-09-11 (×4): qty 3

## 2021-09-11 MED ORDER — PREDNISOLONE 5 MG PO TABS: 10.0000 mg | ORAL_TABLET | Freq: Every evening | ORAL | Status: DC

## 2021-09-11 MED ORDER — DEXAMETHASONE SODIUM PHOSPHATE 10 MG/ML IJ SOLN
INTRAMUSCULAR | Status: DC | PRN
  Administered 2021-09-11: 10 mg via INTRAVENOUS

## 2021-09-11 MED ORDER — POLYETHYLENE GLYCOL 3350 17 G PO PACK
17.0000 g | PACK | Freq: Every day | ORAL | Status: DC
  Administered 2021-09-11 – 2021-09-12 (×2): 17 g via ORAL
  Filled 2021-09-11 (×2): qty 1

## 2021-09-11 MED ORDER — DILTIAZEM HCL ER COATED BEADS 180 MG PO CP24
180.0000 mg | ORAL_CAPSULE | Freq: Every day | ORAL | Status: DC
  Administered 2021-09-12: 180 mg via ORAL
  Filled 2021-09-11: qty 1

## 2021-09-11 MED ORDER — ALUM & MAG HYDROXIDE-SIMETH 200-200-20 MG/5ML PO SUSP: 15.0000 mL | ORAL | Status: DC | PRN

## 2021-09-11 MED ORDER — PREDNISONE 10 MG PO TABS
30.0000 mg | ORAL_TABLET | Freq: Every evening | ORAL | Status: AC
  Administered 2021-09-11 – 2021-09-12 (×2): 30 mg via ORAL
  Filled 2021-09-11 (×2): qty 1

## 2021-09-11 MED ORDER — PROPOFOL 10 MG/ML IV BOLUS
INTRAVENOUS | Status: AC
  Filled 2021-09-11: qty 20

## 2021-09-11 MED ORDER — METOPROLOL TARTRATE 5 MG/5ML IV SOLN: 2.0000 mg | INTRAVENOUS | Status: DC | PRN

## 2021-09-11 MED ORDER — GUAIFENESIN ER 600 MG PO TB12
600.0000 mg | ORAL_TABLET | Freq: Two times a day (BID) | ORAL | Status: DC
  Administered 2021-09-11 – 2021-09-12 (×2): 600 mg via ORAL
  Filled 2021-09-11 (×2): qty 1

## 2021-09-11 MED ORDER — CEFAZOLIN SODIUM-DEXTROSE 2-4 GM/100ML-% IV SOLN
2.0000 g | INTRAVENOUS | Status: DC
  Filled 2021-09-11: qty 100

## 2021-09-11 MED ORDER — PROPOFOL 10 MG/ML IV BOLUS
INTRAVENOUS | Status: DC | PRN
  Administered 2021-09-11: 130 mg via INTRAVENOUS

## 2021-09-11 MED ORDER — LABETALOL HCL 5 MG/ML IV SOLN: 10.0000 mg | INTRAVENOUS | Status: DC | PRN

## 2021-09-11 SURGICAL SUPPLY — 44 items
BAG COUNTER SPONGE SURGICOUNT (BAG) ×2 IMPLANT
BNDG ELASTIC 4X5.8 VLCR STR LF (GAUZE/BANDAGES/DRESSINGS) ×1 IMPLANT
BNDG ELASTIC 6X5.8 VLCR STR LF (GAUZE/BANDAGES/DRESSINGS) ×1 IMPLANT
BNDG GAUZE DERMACEA FLUFF (GAUZE/BANDAGES/DRESSINGS) ×1
BNDG GAUZE DERMACEA FLUFF 4 (GAUZE/BANDAGES/DRESSINGS) IMPLANT
BNDG GAUZE ELAST 4 BULKY (GAUZE/BANDAGES/DRESSINGS) IMPLANT
CANISTER SUCT 3000ML PPV (MISCELLANEOUS) ×2 IMPLANT
CLIP LIGATING EXTRA MED SLVR (CLIP) ×2 IMPLANT
CLIP LIGATING EXTRA SM BLUE (MISCELLANEOUS) ×2 IMPLANT
COVER SURGICAL LIGHT HANDLE (MISCELLANEOUS) ×2 IMPLANT
DRAIN PENROSE 0.25X12 (DRAIN) ×1 IMPLANT
DRAPE EXTREMITY T 121X128X90 (DISPOSABLE) IMPLANT
DRAPE HALF SHEET 40X57 (DRAPES) IMPLANT
DRAPE INCISE IOBAN 66X45 STRL (DRAPES) IMPLANT
DRAPE ORTHO SPLIT 77X108 STRL (DRAPES)
DRAPE SURG ORHT 6 SPLT 77X108 (DRAPES) IMPLANT
DRESSING PEEL AND PLC PRVNA 13 (GAUZE/BANDAGES/DRESSINGS) IMPLANT
DRSG ADAPTIC 3X8 NADH LF (GAUZE/BANDAGES/DRESSINGS) IMPLANT
DRSG PAD ABDOMINAL 8X10 ST (GAUZE/BANDAGES/DRESSINGS) ×1 IMPLANT
DRSG PEEL AND PLACE PREVENA 13 (GAUZE/BANDAGES/DRESSINGS)
ELECT REM PT RETURN 9FT ADLT (ELECTROSURGICAL) ×2
ELECTRODE REM PT RTRN 9FT ADLT (ELECTROSURGICAL) ×1 IMPLANT
GAUZE SPONGE 4X4 12PLY STRL (GAUZE/BANDAGES/DRESSINGS) ×1 IMPLANT
GAUZE SPONGE 4X4 12PLY STRL LF (GAUZE/BANDAGES/DRESSINGS) ×2 IMPLANT
GLOVE BIO SURGEON STRL SZ7.5 (GLOVE) ×2 IMPLANT
GOWN STRL REUS W/ TWL LRG LVL3 (GOWN DISPOSABLE) ×2 IMPLANT
GOWN STRL REUS W/ TWL XL LVL3 (GOWN DISPOSABLE) ×1 IMPLANT
GOWN STRL REUS W/TWL LRG LVL3 (GOWN DISPOSABLE) ×4
GOWN STRL REUS W/TWL XL LVL3 (GOWN DISPOSABLE) ×2
KIT BASIN OR (CUSTOM PROCEDURE TRAY) ×2 IMPLANT
KIT TURNOVER KIT B (KITS) ×2 IMPLANT
NS IRRIG 1000ML POUR BTL (IV SOLUTION) ×2 IMPLANT
PACK GENERAL/GYN (CUSTOM PROCEDURE TRAY) IMPLANT
PAD ARMBOARD 7.5X6 YLW CONV (MISCELLANEOUS) ×4 IMPLANT
SUT ETHILON 2 0 PSLX (SUTURE) ×2 IMPLANT
SUT ETHILON 3 0 PS 1 (SUTURE) IMPLANT
SUT VIC AB 2-0 CT1 27 (SUTURE)
SUT VIC AB 2-0 CT1 TAPERPNT 27 (SUTURE) IMPLANT
SUT VIC AB 3-0 SH 27 (SUTURE)
SUT VIC AB 3-0 SH 27X BRD (SUTURE) IMPLANT
SUT VICRYL 4-0 PS2 18IN ABS (SUTURE) IMPLANT
TOWEL GREEN STERILE (TOWEL DISPOSABLE) ×4 IMPLANT
TOWEL GREEN STERILE FF (TOWEL DISPOSABLE) ×2 IMPLANT
WATER STERILE IRR 1000ML POUR (IV SOLUTION) ×2 IMPLANT

## 2021-09-11 NOTE — Anesthesia Postprocedure Evaluation (Signed)
Anesthesia Post Note  Patient: Wayne Meadows  Procedure(s) Performed: INCISION AND DRAINAGE OF RIGHT ARM FISTULA (Right)     Patient location during evaluation: PACU Anesthesia Type: General Level of consciousness: awake and alert Pain management: pain level controlled Vital Signs Assessment: post-procedure vital signs reviewed and stable Respiratory status: spontaneous breathing, nonlabored ventilation, respiratory function stable and patient connected to nasal cannula oxygen Cardiovascular status: blood pressure returned to baseline and stable Postop Assessment: no apparent nausea or vomiting Anesthetic complications: no   No notable events documented.  Last Vitals:  Vitals:   09/11/21 2030 09/11/21 2100  BP: (!) 152/67 (!) 155/63  Pulse: 92 91  Resp: 17 17  Temp:    SpO2: 95% 94%    Last Pain:  Vitals:   09/11/21 2100  TempSrc:   PainSc: 3                  Keenen Roessner P Kolston Lacount

## 2021-09-11 NOTE — Anesthesia Preprocedure Evaluation (Addendum)
Anesthesia Evaluation  Patient identified by MRN, date of birth, ID band Patient awake    Reviewed: Allergy & Precautions, NPO status , Patient's Chart, lab work & pertinent test results  Airway Mallampati: II  TM Distance: >3 FB Neck ROM: Full    Dental  (+) Edentulous Upper, Edentulous Lower, Dental Advisory Given   Pulmonary COPD (2.5L O2),  oxygen dependent, former smoker,    Pulmonary exam normal breath sounds clear to auscultation       Cardiovascular hypertension, Pt. on medications + angina + CAD, + Past MI and + Cardiac Stents  Normal cardiovascular exam+ dysrhythmias Atrial Fibrillation + Valvular Problems/Murmurs (mild/mod AI) AI  Rhythm:Regular Rate:Normal  TTE 2023 1. Left ventricular ejection fraction, by estimation, is 65 to 70%. The  left ventricle has hyperdynamic function. The left ventricle has no  regional wall motion abnormalities. There is mild concentric left  ventricular hypertrophy. Left ventricular  diastolic parameters are consistent with Grade I diastolic dysfunction  (impaired relaxation).  2. Right ventricular systolic function is normal. The right ventricular  size is normal. There is mildly elevated pulmonary artery systolic  pressure.  3. Left atrial size was mildly dilated.  4. 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  can't rule out PFO.  5. Right atrial size was mildly dilated.  6. The mitral valve is normal in structure. No evidence of mitral valve  regurgitation.  7. The aortic valve was not well visualized. Aortic valve regurgitation  is mild to moderate.  8. The inferior vena cava is normal in size with greater than 50%  respiratory variability, suggesting right atrial pressure of 3 mmHg.   Cath 2022 Stable three-vessel disease with widely patent stent in the proximal Ramus Intermedius, moderate 40 to 50% in-stent restenosis in the proximal RCA BMS,  focal 50% mid LAD just prior to a bend (the previously noted subtotaled first diagonal branch is now fully occluded). Severe Systemic Hypertensionwith moderately elevated LVEDP. (LV gram not performed because of contrast conservation)    Neuro/Psych PSYCHIATRIC DISORDERS Anxiety CVA    GI/Hepatic Neg liver ROS, PUD,   Endo/Other  Hypothyroidism   Renal/GU ESRF and DialysisRenal disease (dialysis MWF, completed dialysis yesterday)Lab Results      Component                Value               Date                      CREATININE               5.10 (H)            09/11/2021                BUN                      46 (H)              09/11/2021                NA                       138                 09/11/2021                K  4.7                 09/11/2021                CL                       100                 09/11/2021                CO2                      27                  09/03/2021             negative genitourinary   Musculoskeletal  (+) Arthritis ,   Abdominal   Peds  Hematology negative hematology ROS (+)   Anesthesia Other Findings   Reproductive/Obstetrics                            Anesthesia Physical Anesthesia Plan  ASA: 3  Anesthesia Plan: General   Post-op Pain Management: Tylenol PO (pre-op)*   Induction: Intravenous  PONV Risk Score and Plan: 2 and Ondansetron and Dexamethasone  Airway Management Planned: Oral ETT and LMA  Additional Equipment:   Intra-op Plan:   Post-operative Plan: Extubation in OR  Informed Consent: I have reviewed the patients History and Physical, chart, labs and discussed the procedure including the risks, benefits and alternatives for the proposed anesthesia with the patient or authorized representative who has indicated his/her understanding and acceptance.     Dental advisory given  Plan Discussed with: CRNA  Anesthesia Plan Comments:          Anesthesia Quick Evaluation

## 2021-09-11 NOTE — Interval H&P Note (Signed)
History and Physical Interval Note:  09/11/2021 5:33 PM  Wayne Meadows  has presented today for surgery, with the diagnosis of ESRD.  The various methods of treatment have been discussed with the patient and family. After consideration of risks, benefits and other options for treatment, the patient has consented to  Procedure(s): INCISION AND DRAINAGE OF RIGHT ARM FISTULA (Right) as a surgical intervention.  The patient's history has been reviewed, patient examined, no change in status, stable for surgery.  I have reviewed the patient's chart and labs.  Questions were answered to the patient's satisfaction.     Cherre Robins

## 2021-09-11 NOTE — Transfer of Care (Signed)
Immediate Anesthesia Transfer of Care Note  Patient: Wayne Meadows  Procedure(s) Performed: INCISION AND DRAINAGE OF RIGHT ARM FISTULA (Right)  Patient Location: PACU  Anesthesia Type:General  Level of Consciousness: awake, alert  and oriented  Airway & Oxygen Therapy: Patient Spontanous Breathing and Patient connected to nasal cannula oxygen  Post-op Assessment: Report given to RN and Post -op Vital signs reviewed and stable  Post vital signs: Reviewed and stable  Last Vitals:  Vitals Value Taken Time  BP 106/80 09/11/21 1830  Temp    Pulse 84 09/11/21 1833  Resp 21 09/11/21 1833  SpO2 95 % 09/11/21 1833  Vitals shown include unvalidated device data.  Last Pain:  Vitals:   09/11/21 1324  TempSrc:   PainSc: 5       Patients Stated Pain Goal: 3 (04/23/29 4388)  Complications: No notable events documented.

## 2021-09-11 NOTE — Op Note (Signed)
DATE OF SERVICE: 09/11/2021  PATIENT:  Wayne Meadows  87 y.o. male  PRE-OPERATIVE DIAGNOSIS:  fluid collection about right arm fistula  POST-OPERATIVE DIAGNOSIS:  hematoma about right arm fistula  PROCEDURE:   Incision and drainage of right arm hematoma  SURGEON:  Surgeon(s) and Role:    * Cherre Robins, MD - Primary  ASSISTANT: Gerri Lins, PA-C  An experienced assistant was required given the complexity of this procedure and the standard of surgical care. My assistant helped with exposure through counter tension, suctioning, ligation and retraction to better visualize the surgical field.  My assistant expedited sewing during the case by following my sutures. Wherever I use the term "we" in the report, my assistant actively helped me with that portion of the procedure.  ANESTHESIA:   general  EBL: minimal  BLOOD ADMINISTERED:none  DRAINS: Penrose drain in the hematoma cavity    LOCAL MEDICATIONS USED:  NONE  SPECIMEN:  none  COUNTS: confirmed correct.  TOURNIQUET:  none  PATIENT DISPOSITION:  PACU - hemodynamically stable.   Delay start of Pharmacological VTE agent (>24hrs) due to surgical blood loss or risk of bleeding: no  INDICATION FOR PROCEDURE: Wayne Meadows is a 87 y.o. male with tense fluid collection about his right arm fistula with early overlying ulceration. After careful discussion of risks, benefits, and alternatives the patient was offered incision and drainage. The patient understood and wished to proceed.  OPERATIVE FINDINGS: hematoma evacuated from arm. Penrose drain left loosely in place. Closed with nylon sutures.  DESCRIPTION OF PROCEDURE: After identification of the patient in the pre-operative holding area, the patient was transferred to the operating room. The patient was positioned supine on the operating room table. Anesthesia was induced. The right arm was prepped and draped in standard fashion. A surgical pause was performed confirming  correct patient, procedure, and operative location.  A 10 blade was used to incise the fluid collection near the right upper extremity arteriovenous fistula.  We encountered hematoma.  This was manually expressed.  The cavity was copiously irrigated.  I manually probed the cavity to ensure there were no loculations.  There was brisk thrill in the fistula.  1/4 inch Penrose drain was placed into the hematoma cavity.  The incision was closed loosely with 2-0 nylon sutures.  A wet-to-dry dressing was applied.  Upon completion of the case instrument and sharps counts were confirmed correct. The patient was transferred to the PACU in good condition. I was present for all portions of the procedure.  [name]. Stanford Breed, MD [facility] Phone Number: [phone] 09/11/2021 6:23 PM

## 2021-09-11 NOTE — Anesthesia Procedure Notes (Signed)
Procedure Name: LMA Insertion Date/Time: 09/11/2021 5:50 PM  Performed by: Minerva Ends, CRNAPre-anesthesia Checklist: Patient identified, Emergency Drugs available, Suction available and Patient being monitored Patient Re-evaluated:Patient Re-evaluated prior to induction Oxygen Delivery Method: Circle system utilized Preoxygenation: Pre-oxygenation with 100% oxygen Induction Type: IV induction Ventilation: Mask ventilation without difficulty LMA: LMA inserted LMA Size: 4.0 Tube type: Oral Number of attempts: 1 Placement Confirmation: positive ETCO2 and breath sounds checked- equal and bilateral Tube secured with: Tape Dental Injury: Teeth and Oropharynx as per pre-operative assessment

## 2021-09-12 ENCOUNTER — Encounter (HOSPITAL_COMMUNITY): Payer: Self-pay | Admitting: Vascular Surgery

## 2021-09-12 DIAGNOSIS — J449 Chronic obstructive pulmonary disease, unspecified: Secondary | ICD-10-CM | POA: Diagnosis not present

## 2021-09-12 DIAGNOSIS — Z955 Presence of coronary angioplasty implant and graft: Secondary | ICD-10-CM | POA: Diagnosis not present

## 2021-09-12 DIAGNOSIS — Z8673 Personal history of transient ischemic attack (TIA), and cerebral infarction without residual deficits: Secondary | ICD-10-CM | POA: Diagnosis not present

## 2021-09-12 DIAGNOSIS — R531 Weakness: Secondary | ICD-10-CM | POA: Diagnosis not present

## 2021-09-12 DIAGNOSIS — N186 End stage renal disease: Secondary | ICD-10-CM | POA: Diagnosis not present

## 2021-09-12 DIAGNOSIS — I2510 Atherosclerotic heart disease of native coronary artery without angina pectoris: Secondary | ICD-10-CM | POA: Diagnosis not present

## 2021-09-12 DIAGNOSIS — T82898A Other specified complication of vascular prosthetic devices, implants and grafts, initial encounter: Secondary | ICD-10-CM | POA: Diagnosis not present

## 2021-09-12 DIAGNOSIS — Z7401 Bed confinement status: Secondary | ICD-10-CM | POA: Diagnosis not present

## 2021-09-12 MED ORDER — OXYCODONE HCL 5 MG PO TABS: 5.0000 mg | ORAL_TABLET | ORAL | 0 refills | Status: DC | PRN

## 2021-09-12 NOTE — Care Management Obs Status (Signed)
[place] NOTIFICATION   Patient Details  Name: Wayne Meadows MRN: [id number] Date of Birth: 11-22-1934   Medicare Observation Status Notification Given:  Yes    Tom-Johnson, Renea Ee, RN 09/12/2021, 10:25 AM

## 2021-09-12 NOTE — Progress Notes (Signed)
Pt to d/c to snf today. No transportation options for pt to receive HD at clinic today. Pt to receive out-pt HD at [facility] tomorrow. Pt needs to arrive at 10:10. CSW has discussed with snf who report that they can transport pt to HD clinic tomorrow. Contacted [facility] and spoke to [place], Agricultural consultant. Clinic aware of pt's d/c today and that snf can transport to make-up treatment tomorrow.   Melven Sartorius Renal Navigator [phone]

## 2021-09-12 NOTE — TOC Progression Note (Addendum)
Transition of Care [facility]) - Initial/Assessment Note    Patient Details  Name: Wayne Meadows MRN: [id number] Date of Birth: 03/18/1934  Transition of Care [facility] CM/SW Contact:    Milinda Antis, [place] Phone Number: 09/12/2021, 12:50 PM  Clinical Narrative:                 1215- CSW was informed that the plan was for the patient to receive dialysis at his outpatient facility not inpatient.  The patient receives dialysis at 1245 outpatient.    CSW contacted [facility].  The facility can pick the patient up from dialysis once completed at 1630, but cannot transport from hospital to the clinic.  CSW contacted [facility].  [facility] is unable to transport the patient to dialysis.  CSW contacted [employer], contracting.  The agency is unable to pick up the patient from the hospital in time to make it to his appointment.  (Earliest pickup time= 1600)  CSW attempted to contact the patient's daughter to inquire about her ability to transport.  There was no answer.  CSW informed renal navigator of the inability to secure transportation to dialysis.  Renal navigator informed the team.    CSW was informed by renal navigator that the outpatient dialysis clinic can accept the patient tomorrow at 10:10am to make up the session.   CSW called [facility] to inquire about the facility's ability to transport the patient to his dialysis clinic tomorrow to make up missed session today.  The facility will contact their transportation team and inform CSW of the outcome.  1415-  SNF can transport patient to dialysis at outpatient center tomorrow at 1010.    Patient stable for d/c see transition note.   Barriers to Discharge: No Barriers Identified   Patient Goals and CMS Choice   CMS Medicare.gov Compare Post Acute Care list provided to:: Patient Choice offered to / list presented to : Patient  Expected Discharge Plan and Services           Expected Discharge Date: 09/12/21                                     Prior Living Arrangements/Services                       Activities of Daily Living Home Assistive Devices/Equipment: Wheelchair, Environmental consultant (specify type), Oxygen ADL Screening (condition at time of admission) Patient's cognitive ability adequate to safely complete daily activities?: Yes Is the patient deaf or have difficulty hearing?: No Does the patient have difficulty seeing, even when wearing glasses/contacts?: No Does the patient have difficulty concentrating, remembering, or making decisions?: No Patient able to express need for assistance with ADLs?: Yes Does the patient have difficulty dressing or bathing?: Yes Independently performs ADLs?: Yes (appropriate for developmental age) Does the patient have difficulty walking or climbing stairs?: Yes (yes walking issues but can climb stairs) Weakness of Legs: None Weakness of Arms/Hands: None  Permission Sought/Granted                  Emotional Assessment              Admission diagnosis:  ESRD (end stage renal disease) ([place]) [N18.6] Patient Active Problem List   Diagnosis Date Noted   ESRD (end stage renal disease) ([place]) 09/11/2021   Partial small bowel obstruction ([place]) 08/17/2021   Right lower lobe pneumonia 08/13/2021  Acute on chronic respiratory failure with hypoxia ([place]) 08/13/2021   Arteriovenous fistula infection ([place]) 04/18/2021   Gastric ulcer without hemorrhage or perforation    Duodenal ulcer    Elevated INR    Acute upper GI bleed 04/06/2021   ESRD (end stage renal disease) on dialysis ([place]) 04/06/2021   Supratherapeutic INR 04/06/2021   Proteinuria 03/07/2021   AKI (acute kidney injury) ([place]) 03/05/2021   Hypothyroidism 03/05/2021   Anxiety 03/05/2021   Recurrent cellulitis of lower leg 03/04/2021   Chronic pancreatitis ([place]) 12/24/2020   Nausea and vomiting in adult 06/22/2020   Esophageal stricture    Hyperbilirubinemia 04/11/2016   Elevated lactic acid level    Segmental  colitis ([place]) 09/04/2015   Noninfectious gastroenteritis, unspecified    Benign neoplasm of transverse colon    Benign neoplasm of colon    Bright red blood per rectum    Dysphagia    Renal cell cancer ([place])    Hypertension    Centrilobular emphysema ([place])    Blood in stool 08/19/2015   Rectal bleeding 08/19/2015   CAP (community acquired pneumonia) 04/08/2014   Overweight (BMI 25.0-29.9) 04/08/2014   Healthcare-associated pneumonia 10/26/2011   Cholelithiasis 04/26/2011   Atrial fibrillation ([place]) 04/26/2011   Chest pain 03/19/2011   Unstable angina ([place]) 03/18/2011    Class: Acute   Lung nodule 03/02/2011   Liver lesion 03/02/2011   Other hyperlipidemia 03/02/2011   Abnormal CT of the abdomen 02/23/2011   Non Q wave myocardial infarction ([place]) 02/22/2011    Class: Acute   History of CVA (cerebrovascular accident) 02/22/2011   CAD (coronary artery disease) 02/22/2011   COPD with acute exacerbation ([place]) 02/22/2011   Tobacco abuse, 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

## 2021-09-12 NOTE — Consult Note (Addendum)
[facility] Nurse Consult Note: Reason for Consult: Pt is familiar to [facility] nurse from previous admission on 6/25. 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  (Change foam dressing Q 3 days or PRN soiling.) Please re-consult if further assistance is needed.  Thank-you,  Julien Girt MSN, [place], [place], [place], [place]

## 2021-09-12 NOTE — Discharge Summary (Signed)
[facility] Discharge Summary   Patient ID:  Wayne Meadows MRN: [id number] DOB/AGE: 85-Sep-1936 84 y.o.  Admit date: 09/11/2021 Discharge date: 09/12/2021 Date of Surgery: 09/11/2021 Surgeon: Surgeon(s): Cherre Robins, MD  Admission Diagnosis: ESRD (end stage renal disease) [facility]) [N18.6]  Discharge Diagnoses:  ESRD (end stage renal disease) ([place]) [N18.6]  Secondary Diagnoses: Past Medical History:  Diagnosis Date   Abnormal echocardiogram 03/08/2021   Abnormal finding on GI tract imaging    Acute blood loss anemia    Acute pancreatitis 08/12/2020   Arthritis    Carotid stenosis    Community acquired pneumonia 04/06/2021   COPD (chronic obstructive pulmonary disease) ([place])    Coronary artery disease    S/p PCI 2011;  NSTEMI 12/12:  LHC/PCI 02/23/11: LAD 60% after the septal perforator, D1 occluded with distal collaterals, proximal RI 30-40%, AV circumflex stent patent with 60% stenosis after the stent, RCA 99%, EF 60-65%.  His RCA was treated with a bare-metal stent   CVA (cerebral infarction) 2011   Right cerebral; total obstruction of the right ICA   Diverticulitis    Hypertension    Paroxysmal atrial fibrillation with RVR ([place]) 04/09/2021   Pleuritic chest pain 04/08/2014   Pressure injury of skin 03/30/2021   Rash 03/05/2021   Rectal bleeding 07/2015   Refractory nausea and vomiting 06/20/2020   Renal carcinoma (HCC)    Sepsis, unspecified organism ([place]) 04/11/2016   Stroke ([name])    Tobacco abuse, in remission     Procedure(s): INCISION AND DRAINAGE OF RIGHT ARM FISTULA  Discharged Condition: good  HPI: 87 y/o male with ESRD.  He has a working right UE AV fistula.  He has developed a traumatic cannulation of his right upper extremity fistula with a tense fluid collection in the upper arm which is causing some early ulceration of the skin.  He was scheduled for Irrigation and debridement of the right UE.   Hospital Course:  Wayne Meadows is a 87  y.o. male is S/P Right Procedure(s): INCISION AND DRAINAGE OF RIGHT ARM FISTULA mof right UE hematoma.  Pain is well controlled Ecchymosis, Pen rose drain in place Palpable radial pulse and thrill in fistula Right UE N/M/V intact Lungs non labored breathing     Pen rose will be maintained for 5 days.  Continue HD via right UE AV fistula.  Dry dressing daily to incision.  Plan for f/u in office in 2 weeks for suture removal and incision check. Wound care nurse has been consulted to make recommendation on lumbar sklin wound by night RN.  Pending recommendations.  Plan to discharge back to SNF.  Significant Diagnostic Studies: CBC Lab Results  Component Value Date   WBC 10.3 09/11/2021   HGB 10.7 (L) 09/11/2021   HCT 33.4 (L) 09/11/2021   MCV 92.0 09/11/2021   PLT 284 09/11/2021    BMET    Component Value Date/Time   NA 138 09/11/2021 1307   K 4.7 09/11/2021 1307   CL 100 09/11/2021 1307   CO2 27 09/03/2021 1834   GLUCOSE 94 09/11/2021 1307   BUN 46 (H) 09/11/2021 1307   CREATININE 5.64 (H) 09/11/2021 2206   CALCIUM 8.6 (L) 09/03/2021 1834   GFRNONAA 9 (L) 09/11/2021 2206   GFRAA 52 (L) 07/31/2017 1630   COAG Lab Results  Component Value Date   INR 1.1 08/13/2021   INR 1.1 04/18/2021   INR 1.2 04/09/2021     Disposition:  Discharge to :Skilled  nursing facility Discharge Instructions     Activity as tolerated - No restrictions   Complete by: As directed    Call MD for:  redness, tenderness, or signs of infection (pain, swelling, bleeding, redness, odor or green/yellow discharge around incision site)   Complete by: As directed    Call MD for:  severe or increased pain, loss or decreased feeling  in affected limb(s)   Complete by: As directed    Call MD for:  temperature >100.5   Complete by: As directed    Discharge instructions   Complete by: As directed    Pen rose drain to be maintained for 5 days then can be removed.  Dry dressing changes q day.  OK to use  fistula for HD.  F/U in 2 weeks for incision check and suture removal.  Wound care to lumbar skin needs to be addressed by care facility MD please.   Resume previous diet   Complete by: As directed       Allergies as of 09/12/2021       Reactions   Bee Venom Anaphylaxis   Has epi pen   Influenza Vaccines Other (See Comments)   "Mortally sick for 2 weeks"        Medication List     TAKE these medications    NovaSource Renal Liqd Take 237 mLs by mouth in the morning and at bedtime. (0800 & 1700)   (feeding supplement) PROSource Plus liquid Take 30 mLs by mouth 2 (two) times daily between meals.   feeding supplement (NEPRO CARB STEADY) Liqd Take 237 mLs by mouth 3 (three) times daily before meals.   acetaminophen 325 MG tablet Commonly known as: TYLENOL Take 2 tablets (650 mg total) by mouth every 6 (six) hours as needed for mild pain (or Fever >/= 101).   albuterol 108 (90 Base) MCG/ACT inhaler Commonly known as: VENTOLIN HFA Inhale 2 puffs into the lungs every 6 (six) hours as needed for shortness of breath.   aspirin EC 81 MG tablet Take 81 mg by mouth daily.  (0900)   Biofreeze 4 % Gel Generic drug: Menthol (Topical Analgesic) Apply 1 Application topically 3 (three) times daily as needed (pain).   bisacodyl 10 MG suppository Commonly known as: DULCOLAX Place 10 mg rectally daily as needed for moderate constipation.   busPIRone 5 MG tablet Commonly known as: BUSPAR Take 5 mg by mouth 2 (two) times daily. (0800 & 2000)   camphor-menthol lotion Commonly known as: SARNA Apply topically as needed for itching. What changed: how much to take   cloNIDine 0.1 MG tablet Commonly known as: CATAPRES Take 1 tablet (0.1 mg total) by mouth daily.   DECUBI-VITE PO Take 1 tablet by mouth in the morning. (0900)   diltiazem 180 MG 24 hr capsule Commonly known as: CARDIZEM CD Take 1 capsule (180 mg total) by mouth daily.   docusate sodium 100 MG capsule Commonly  known as: COLACE Take 1 capsule (100 mg total) by mouth every 12 (twelve) hours.   feeding supplement (PRO-STAT SUGAR FREE 64) Liqd Take 30 mLs by mouth every evening. (1700)   ferrous sulfate 325 (65 FE) MG tablet Take 1 tablet by mouth every Monday, Wednesday, and Friday. (0900)   guaiFENesin 300 MG/15ML Liqd Take 600 mg by mouth every 12 (twelve) hours. (0900 & 2100)   hydrALAZINE 10 MG tablet Commonly known as: APRESOLINE Take 1 tablet (10 mg total) by mouth every 8 (eight) hours as needed (for SBP  greater than 180).   HYDROcodone-acetaminophen 5-325 MG tablet Commonly known as: NORCO/VICODIN Take 1 tablet by mouth every 6 (six) hours as needed for moderate pain or severe pain.   hydrOXYzine 10 MG tablet Commonly known as: ATARAX Take 10 mg by mouth 3 (three) times daily as needed for itching.   ipratropium-albuterol 0.5-2.5 (3) MG/3ML Soln Commonly known as: DUONEB Take 3 mLs by nebulization every 4 (four) hours as needed (shortness of breath/wheezing).   Ipratropium-Albuterol 20-100 MCG/ACT Aers respimat Commonly known as: COMBIVENT Inhale 1 puff into the lungs 4 (four) times daily. (0900, 1300, 1700 & 2100)   lanthanum 1000 MG chewable tablet Commonly known as: FOSRENOL Chew 1 tablet (1,000 mg total) by mouth 3 (three) times daily with meals.   levothyroxine 50 MCG tablet Commonly known as: SYNTHROID Take 50 mcg by mouth daily before breakfast. (0630)   melatonin 5 MG Tabs Take 5 mg by mouth at bedtime. (2100)   multivitamin with minerals Tabs tablet Take 1 tablet by mouth daily. (0900)   naphazoline-glycerin 0.012-0.25 % Soln Commonly known as: CLEAR EYES REDNESS Place 1-2 drops into the right eye 3 (three) times daily.   neomycin-bacitracin-polymyxin Oint Commonly known as: NEOSPORIN Apply 1 application topically 3 (three) times daily. To right antecubital area   ondansetron 4 MG tablet Commonly known as: Zofran Take 1 tablet (4 mg total) by mouth  daily as needed for nausea or vomiting.   oxyCODONE 5 MG immediate release tablet Commonly known as: Oxy IR/ROXICODONE Take 1 tablet (5 mg total) by mouth every 4 (four) hours as needed (pain.). What changed: when to take this   pantoprazole 40 MG tablet Commonly known as: PROTONIX Take 1 tablet (40 mg total) by mouth 2 (two) times daily.   polyethylene glycol 17 g packet Commonly known as: MIRALAX / GLYCOLAX Take 17 g by mouth daily.   pravastatin 40 MG tablet Commonly known as: PRAVACHOL Take 40 mg by mouth at bedtime. (2100)   prednisoLONE 10 MG disintegrating tablet Commonly known as: ORAPRED ODT Take 10 mg by mouth See admin instructions. Take 1 tablet (10 mg) by mouth for 2 days   predniSONE 10 MG tablet Commonly known as: DELTASONE Take 20-30 mg by mouth See admin instructions. Take 3 tablets (30 mg) by mouth daily in the evening for 2 days, then decrease to take 2 tablets (20 mg) by mouth in the evening for 2 days then discontinue.   predniSONE 20 MG tablet Commonly known as: DELTASONE Take 20 mg by mouth See admin instructions. Take 2 tablets (40 mg) by mouth in the evening for 2 days.   tamsulosin 0.4 MG Caps capsule Commonly known as: FLOMAX Take 1 capsule (0.4 mg total) by mouth daily.       Verbal and written Discharge instructions given to the patient. Wound care per Discharge AVS  Follow-up Information     Cherre Robins, MD Follow up in 2 week(s).   Specialties: Vascular Surgery, Interventional Cardiology Why: Office will call you to arrange your appt (sent) Contact information: [place] [address] [phone]                 Signed: Roxy Horseman 09/12/2021, 8:47 AM

## 2021-09-12 NOTE — Progress Notes (Addendum)
[facility] [place]  Subjective  - Arm feels fine.   Objective (!) 145/64 91 98.1 F (36.7 C) 20 95%  Intake/Output Summary (Last 24 hours) at 09/12/2021 0816 Last data filed at 09/12/2021 0400 Gross per 24 hour  Intake 500 ml  Output 320 ml  Net 180 ml      Ecchymosis, Pen rose drain in place Palpable radial pulse and thrill in fistula Right UE N/M/V intact Lungs non labored breathing   Assessment/Planning: POD # 1 Evacuation of right UE Hematoma   Pen rose will be maintained for 5 days.  Continue HD via right UE AV fistula.  Dry dressing daily to incision.  Plan for f/u in office in 2 weeks for suture removal and incision check. Wound care nurse has been consulted to make recommendation on lumbar sklin wound by night RN.  Pending recommendations.  Plan to discharge back to SNF.  Roxy Horseman 09/12/2021 8:16 AM --  Laboratory Lab Results: Recent Labs    09/11/21 1307 09/11/21 2206  WBC  --  10.3  HGB 11.2* 10.7*  HCT 33.0* 33.4*  PLT  --  284   BMET Recent Labs    09/11/21 1307 09/11/21 2206  NA 138  --   K 4.7  --   CL 100  --   GLUCOSE 94  --   BUN 46*  --   CREATININE 5.10* 5.64*    COAG Lab Results  Component Value Date   INR 1.1 08/13/2021   INR 1.1 04/18/2021   INR 1.2 04/09/2021   No results found for: "PTT"  VASCULAR STAFF ADDENDUM: I agree with the above.   [name]. Stanford Breed, MD [facility] Phone Number: [phone] 09/12/2021 12:06 PM

## 2021-09-12 NOTE — TOC Transition Note (Signed)
Transition of Care [facility]) - CM/SW Discharge Note   Patient Details  Name: Wayne Meadows MRN: [id number] Date of Birth: [birth year]  Transition of Care [facility]) CM/SW Contact:  Milinda Antis, [place] Phone Number: 09/12/2021, 11:17 AM   Clinical Narrative:    Patient will DC to: [facility] Anticipated DC date:  09/12/2021 Transport by: Corey Harold   Per MD patient ready for DC to SNF. RN to call report prior to discharge [phone] room 712. RN, patient,  and facility notified of DC. Discharge Summary and FL2 sent to facility. DC packet will be placed on chart. Ambulance transport will be requested for patient after patient returns from dialysis.   CSW will sign off for now as social work intervention is no longer needed. Please consult [place] again if new needs arise.     Final next level of care: Skilled Nursing Facility Barriers to Discharge: No Barriers Identified   Patient Goals and CMS Choice   CMS Medicare.gov Compare Post Acute Care list provided to:: Patient Choice offered to / list presented to : Patient  Discharge Placement              Patient chooses bed at:  ([facility]) Patient to be transferred to facility by: [place] Name of family member notified: self Patient and family notified of of transfer: 09/12/21  Discharge Plan and Services                                     Social Determinants of Health (SDOH) Interventions     Readmission Risk Interventions    03/21/2021    1:21 PM 03/19/2021    1:23 PM  Readmission Risk Prevention Plan  Transportation Screening Complete   HRI or Home Care Consult Complete   SW Recovery Care/Counseling Consult Complete Complete  Skilled Nursing Facility Complete Complete

## 2021-09-13 DIAGNOSIS — J439 Emphysema, unspecified: Secondary | ICD-10-CM | POA: Diagnosis not present

## 2021-09-13 DIAGNOSIS — N139 Obstructive and reflux uropathy, unspecified: Secondary | ICD-10-CM | POA: Diagnosis not present

## 2021-09-13 DIAGNOSIS — J449 Chronic obstructive pulmonary disease, unspecified: Secondary | ICD-10-CM | POA: Diagnosis not present

## 2021-09-13 DIAGNOSIS — I739 Peripheral vascular disease, unspecified: Secondary | ICD-10-CM | POA: Diagnosis not present

## 2021-09-13 DIAGNOSIS — R262 Difficulty in walking, not elsewhere classified: Secondary | ICD-10-CM | POA: Diagnosis not present

## 2021-09-13 DIAGNOSIS — N186 End stage renal disease: Secondary | ICD-10-CM | POA: Diagnosis not present

## 2021-09-13 DIAGNOSIS — K861 Other chronic pancreatitis: Secondary | ICD-10-CM | POA: Diagnosis not present

## 2021-09-13 DIAGNOSIS — R2681 Unsteadiness on feet: Secondary | ICD-10-CM | POA: Diagnosis not present

## 2021-09-13 DIAGNOSIS — Z992 Dependence on renal dialysis: Secondary | ICD-10-CM | POA: Diagnosis not present

## 2021-09-13 DIAGNOSIS — R293 Abnormal posture: Secondary | ICD-10-CM | POA: Diagnosis not present

## 2021-09-13 DIAGNOSIS — I679 Cerebrovascular disease, unspecified: Secondary | ICD-10-CM | POA: Diagnosis not present

## 2021-09-13 DIAGNOSIS — E1122 Type 2 diabetes mellitus with diabetic chronic kidney disease: Secondary | ICD-10-CM | POA: Diagnosis not present

## 2021-09-13 DIAGNOSIS — R41841 Cognitive communication deficit: Secondary | ICD-10-CM | POA: Diagnosis not present

## 2021-09-13 DIAGNOSIS — M6281 Muscle weakness (generalized): Secondary | ICD-10-CM | POA: Diagnosis not present

## 2021-09-13 DIAGNOSIS — D631 Anemia in chronic kidney disease: Secondary | ICD-10-CM | POA: Diagnosis not present

## 2021-09-14 DIAGNOSIS — I679 Cerebrovascular disease, unspecified: Secondary | ICD-10-CM | POA: Diagnosis not present

## 2021-09-14 DIAGNOSIS — R262 Difficulty in walking, not elsewhere classified: Secondary | ICD-10-CM | POA: Diagnosis not present

## 2021-09-14 DIAGNOSIS — J449 Chronic obstructive pulmonary disease, unspecified: Secondary | ICD-10-CM | POA: Diagnosis not present

## 2021-09-14 DIAGNOSIS — E1122 Type 2 diabetes mellitus with diabetic chronic kidney disease: Secondary | ICD-10-CM | POA: Diagnosis not present

## 2021-09-14 DIAGNOSIS — K861 Other chronic pancreatitis: Secondary | ICD-10-CM | POA: Diagnosis not present

## 2021-09-14 DIAGNOSIS — N186 End stage renal disease: Secondary | ICD-10-CM | POA: Diagnosis not present

## 2021-09-15 ENCOUNTER — Telehealth: Payer: Self-pay | Admitting: Vascular Surgery

## 2021-09-15 DIAGNOSIS — I679 Cerebrovascular disease, unspecified: Secondary | ICD-10-CM | POA: Diagnosis not present

## 2021-09-15 DIAGNOSIS — K861 Other chronic pancreatitis: Secondary | ICD-10-CM | POA: Diagnosis not present

## 2021-09-15 DIAGNOSIS — E1122 Type 2 diabetes mellitus with diabetic chronic kidney disease: Secondary | ICD-10-CM | POA: Diagnosis not present

## 2021-09-15 DIAGNOSIS — J449 Chronic obstructive pulmonary disease, unspecified: Secondary | ICD-10-CM | POA: Diagnosis not present

## 2021-09-15 DIAGNOSIS — N186 End stage renal disease: Secondary | ICD-10-CM | POA: Diagnosis not present

## 2021-09-15 DIAGNOSIS — R262 Difficulty in walking, not elsewhere classified: Secondary | ICD-10-CM | POA: Diagnosis not present

## 2021-09-15 DIAGNOSIS — I120 Hypertensive chronic kidney disease with stage 5 chronic kidney disease or end stage renal disease: Secondary | ICD-10-CM | POA: Diagnosis not present

## 2021-09-15 DIAGNOSIS — G894 Chronic pain syndrome: Secondary | ICD-10-CM | POA: Diagnosis not present

## 2021-09-15 DIAGNOSIS — F419 Anxiety disorder, unspecified: Secondary | ICD-10-CM | POA: Diagnosis not present

## 2021-09-15 NOTE — Telephone Encounter (Signed)
-----   Message from Cherre Robins, MD sent at 09/11/2021  6:27 PM EDT ----- Wayne Meadows 09/11/2021 Procedure: Incision and drainage of right arm hematoma Assistant: none Follow up: 2-3 weeks with [facility] PA Studies for follow up: none  Thank you! Gershon Mussel

## 2021-09-15 NOTE — Telephone Encounter (Signed)
Laurence Slate M, PA-C  P [facility] Charge Pool; P [facility] Admin [employer] evacuation right UE hematoma f/u in 2 weeks for incision check and suture removal

## 2021-09-17 ENCOUNTER — Emergency Department (HOSPITAL_COMMUNITY): Payer: No Typology Code available for payment source

## 2021-09-17 ENCOUNTER — Emergency Department (HOSPITAL_COMMUNITY)
Admission: EM | Admit: 2021-09-17 | Discharge: 2021-09-17 | Disposition: A | Discharge status: Home / Self Care | Payer: No Typology Code available for payment source | Attending: Emergency Medicine | Admitting: Emergency Medicine

## 2021-09-17 ENCOUNTER — Encounter (HOSPITAL_COMMUNITY): Payer: Self-pay

## 2021-09-17 ENCOUNTER — Other Ambulatory Visit: Payer: Self-pay

## 2021-09-17 DIAGNOSIS — Z85520 Personal history of malignant carcinoid tumor of kidney: Secondary | ICD-10-CM | POA: Insufficient documentation

## 2021-09-17 DIAGNOSIS — I2510 Atherosclerotic heart disease of native coronary artery without angina pectoris: Secondary | ICD-10-CM | POA: Diagnosis not present

## 2021-09-17 DIAGNOSIS — S79912A Unspecified injury of left hip, initial encounter: Secondary | ICD-10-CM | POA: Diagnosis present

## 2021-09-17 DIAGNOSIS — W050XXA Fall from non-moving wheelchair, initial encounter: Secondary | ICD-10-CM | POA: Insufficient documentation

## 2021-09-17 DIAGNOSIS — R0789 Other chest pain: Secondary | ICD-10-CM | POA: Insufficient documentation

## 2021-09-17 DIAGNOSIS — M2550 Pain in unspecified joint: Secondary | ICD-10-CM | POA: Diagnosis not present

## 2021-09-17 DIAGNOSIS — Z7982 Long term (current) use of aspirin: Secondary | ICD-10-CM | POA: Diagnosis not present

## 2021-09-17 DIAGNOSIS — Z79899 Other long term (current) drug therapy: Secondary | ICD-10-CM | POA: Diagnosis not present

## 2021-09-17 DIAGNOSIS — Z7951 Long term (current) use of inhaled steroids: Secondary | ICD-10-CM | POA: Insufficient documentation

## 2021-09-17 DIAGNOSIS — J449 Chronic obstructive pulmonary disease, unspecified: Secondary | ICD-10-CM | POA: Diagnosis not present

## 2021-09-17 DIAGNOSIS — Z7401 Bed confinement status: Secondary | ICD-10-CM | POA: Diagnosis not present

## 2021-09-17 DIAGNOSIS — I10 Essential (primary) hypertension: Secondary | ICD-10-CM | POA: Diagnosis not present

## 2021-09-17 DIAGNOSIS — S32592A Other specified fracture of left pubis, initial encounter for closed fracture: Secondary | ICD-10-CM | POA: Insufficient documentation

## 2021-09-17 LAB — BASIC METABOLIC PANEL
Anion gap: 18 — ABNORMAL HIGH (ref 5–15)
BUN: 85 mg/dL — ABNORMAL HIGH (ref 8–23)
CO2: 20 mmol/L — ABNORMAL LOW (ref 22–32)
Calcium: 8.4 mg/dL — ABNORMAL LOW (ref 8.9–10.3)
Chloride: 98 mmol/L (ref 98–111)
Creatinine, Ser: 6.32 mg/dL — ABNORMAL HIGH (ref 0.61–1.24)
GFR, Estimated: 8 mL/min — ABNORMAL LOW (ref >60)
Glucose, Bld: 102 mg/dL — ABNORMAL HIGH (ref 70–99)
Potassium: 4.6 mmol/L (ref 3.5–5.1)
Sodium: 136 mmol/L (ref 135–145)

## 2021-09-17 LAB — CBC
HCT: 34.2 % — ABNORMAL LOW (ref 39.0–52.0)
Hemoglobin: 10.6 g/dL — ABNORMAL LOW (ref 13.0–17.0)
MCH: 29.4 pg (ref 26.0–34.0)
MCHC: 31 g/dL (ref 30.0–36.0)
MCV: 95 fL (ref 80.0–100.0)
Platelets: 216 10*3/uL (ref 150–400)
RBC: 3.6 MIL/uL — ABNORMAL LOW (ref 4.22–5.81)
RDW: 17.4 % — ABNORMAL HIGH (ref 11.5–15.5)
WBC: 13.3 10*3/uL — ABNORMAL HIGH (ref 4.0–10.5)
nRBC: 0 % (ref 0.0–0.2)

## 2021-09-17 MED ORDER — HYDROCODONE-ACETAMINOPHEN 5-325 MG PO TABS: 1.0000 | ORAL_TABLET | ORAL | 0 refills | Status: DC | PRN

## 2021-09-17 MED ORDER — HYDROMORPHONE HCL 1 MG/ML IJ SOLN
0.5000 mg | Freq: Once | INTRAMUSCULAR | Status: AC
  Administered 2021-09-17: 0.5 mg via INTRAVENOUS
  Filled 2021-09-17: qty 1

## 2021-09-17 NOTE — Discharge Instructions (Signed)
Please return to the ED with any new or worsening symptoms Please follow-up with Dr. Sammuel Hines in his office next week.  I have referred you, his information is attached to this document.  You will need to call and make an appointment to be seen. Please read attached guide concerning simple pelvic fractures Please utilize incentive spirometer for left-sided chest wall pain.  Please also utilize splinting as we discussed. Please pick up prescription pain medication.

## 2021-09-17 NOTE — ED Notes (Signed)
Patient provided with something to eat and drink at this time. NIBP, cardiac monitor and pulse ox remain in place. Call bell within reach and patient instructed to use if assistance is needed.

## 2021-09-17 NOTE — ED Notes (Signed)
This RN attempted to give report to [facility] at this time. No answer x1. Will attempt to give report about patient returning to facility again.

## 2021-09-17 NOTE — ED Triage Notes (Signed)
Pt BIB [facility] from [facility] facility d/t a fall. Pt Slipped out of the wheel chair to the floor. Landed on L side, c/o L hip pain. No shortening or rotation noted. Did not hit head, not on thinner, no LOC. A&O X4. DIALYSIS pt.   100 fentanyl given by EMS.   156/84 HR 80  97% on 3L- baseline.

## 2021-09-17 NOTE — ED Notes (Signed)
Patient transported to X-ray 

## 2021-09-17 NOTE — ED Notes (Signed)
This RN attempted to call report x1, transferred to voicemail of RN Colletta [place]. This RN left voicemail in regards to patient returning to facility and gave call back number to call for report.

## 2021-09-17 NOTE — ED Provider Notes (Signed)
[facility] EMERGENCY DEPARTMENT Provider Note   CSN: [id number] Arrival date & time: 09/17/21  1222     History  Chief Complaint  Patient presents with   Wayne Meadows is a 87 y.o. male with medical history of paroxysmal A-fib RVR, arthritis, carotid stenosis, COPD, CAD, diverticulitis, hypertension, renal cell carcinoma, stroke.  The patient presents to ED for evaluation of fall.  Patient states prior to arrival he slid out of his wheelchair "very hard" and landed on a concrete floor.  The patient states since this time he has had left-sided hip pain.  Patient denies losing consciousness, striking his head.  The patient denies blood thinners.  Patient also complaining of left-sided chest wall pain, states that he fell on the left side of his chest wall 2 weeks ago and came to the ER, had imaging done which was negative.  Patient states that today he fell onto the left side of his chest wall again reinjuring it.   Fall       Home Medications Prior to Admission medications   Medication Sig Start Date End Date Taking? Authorizing Provider  HYDROcodone-acetaminophen (NORCO/VICODIN) 5-325 MG tablet Take 1 tablet by mouth every 4 (four) hours as needed for severe pain. 09/17/21  Yes Azucena Cecil, PA-C  acetaminophen (TYLENOL) 325 MG tablet Take 2 tablets (650 mg total) by mouth every 6 (six) hours as needed for mild pain (or Fever >/= 101). 04/10/21   Bonnielee Haff, MD  albuterol (VENTOLIN HFA) 108 (90 Base) MCG/ACT inhaler Inhale 2 puffs into the lungs every 6 (six) hours as needed for shortness of breath.    [provider]  Amino Acids-Protein Hydrolys (FEEDING SUPPLEMENT, PRO-STAT SUGAR FREE 64,) LIQD Take 30 mLs by mouth every evening. (1700)    [provider]  aspirin EC 81 MG tablet Take 81 mg by mouth daily.  (0900)    [provider]  bisacodyl (DULCOLAX) 10 MG suppository Place 10 mg rectally daily as needed for  moderate constipation.    [provider]  busPIRone (BUSPAR) 5 MG tablet Take 5 mg by mouth 2 (two) times daily. (0800 & 2000)    [provider]  camphor-menthol (SARNA) lotion Apply topically as needed for itching. Patient taking differently: Apply 1 Application topically as needed for itching. 04/23/21   Alma Friendly, MD  cloNIDine (CATAPRES) 0.1 MG tablet Take 1 tablet (0.1 mg total) by mouth daily. 06/24/20   Harvie Heck, MD  diltiazem (CARDIZEM CD) 180 MG 24 hr capsule Take 1 capsule (180 mg total) by mouth daily. 04/11/21   Bonnielee Haff, MD  docusate sodium (COLACE) 100 MG capsule Take 1 capsule (100 mg total) by mouth every 12 (twelve) hours. 05/01/21   Sherwood Gambler, MD  ferrous sulfate 325 (65 FE) MG tablet Take 1 tablet by mouth every Monday, Wednesday, and Friday. (0900) 07/30/20   [provider]  guaiFENesin 300 MG/15ML LIQD Take 600 mg by mouth every 12 (twelve) hours. (0900 & 2100)    [provider]  hydrALAZINE (APRESOLINE) 10 MG tablet Take 1 tablet (10 mg total) by mouth every 8 (eight) hours as needed (for SBP greater than 180). 04/04/21   Little Ishikawa, MD  hydrOXYzine (ATARAX) 10 MG tablet Take 10 mg by mouth 3 (three) times daily as needed for itching.    [provider]  Ipratropium-Albuterol (COMBIVENT) 20-100 MCG/ACT AERS respimat Inhale 1 puff into the lungs 4 (four)  times daily. (0900, 1300, 1700 & 2100)    [provider]  ipratropium-albuterol (DUONEB) 0.5-2.5 (3) MG/3ML SOLN Take 3 mLs by nebulization every 4 (four) hours as needed (shortness of breath/wheezing).    [provider]  lanthanum (FOSRENOL) 1000 MG chewable tablet Chew 1 tablet (1,000 mg total) by mouth 3 (three) times daily with meals. 04/04/21   Little Ishikawa, MD  levothyroxine (SYNTHROID) 50 MCG tablet Take 50 mcg by mouth daily before breakfast. (0630)    [provider]  melatonin 5 MG TABS Take 5 mg by mouth at  bedtime. (2100)    [provider]  Menthol, Topical Analgesic, (BIOFREEZE) 4 % GEL Apply 1 Application topically 3 (three) times daily as needed (pain).    [provider]  Multiple Vitamin (MULTIVITAMIN WITH MINERALS) TABS tablet Take 1 tablet by mouth daily. (0900)    [provider]  Multiple Vitamins-Minerals (DECUBI-VITE PO) Take 1 tablet by mouth in the morning. (0900)    [provider]  naphazoline-glycerin (CLEAR EYES REDNESS) 0.012-0.25 % SOLN Place 1-2 drops into the right eye 3 (three) times daily. 04/23/21   Alma Friendly, MD  neomycin-bacitracin-polymyxin (NEOSPORIN) OINT Apply 1 application topically 3 (three) times daily. To right antecubital area 04/10/21   Bonnielee Haff, MD  Nutritional Supplements (,FEEDING SUPPLEMENT, PROSOURCE PLUS) liquid Take 30 mLs by mouth 2 (two) times daily between meals. Patient not taking: Reported on 09/10/2021 04/23/21   Alma Friendly, MD  Nutritional Supplements (FEEDING SUPPLEMENT, NEPRO CARB STEADY,) LIQD Take 237 mLs by mouth 3 (three) times daily before meals. Patient not taking: Reported on 09/10/2021 04/23/21   Alma Friendly, MD  Nutritional Supplements (NOVASOURCE RENAL) LIQD Take 237 mLs by mouth in the morning and at bedtime. (0800 & 1700)    [provider]  ondansetron (ZOFRAN) 4 MG tablet Take 1 tablet (4 mg total) by mouth daily as needed for nausea or vomiting. 04/11/21 04/11/22  Bonnielee Haff, MD  oxyCODONE (OXY IR/ROXICODONE) 5 MG immediate release tablet Take 1 tablet (5 mg total) by mouth every 4 (four) hours as needed (pain.). 09/12/21   Ulyses Amor, PA-C  pantoprazole (PROTONIX) 40 MG tablet Take 1 tablet (40 mg total) by mouth 2 (two) times daily. 04/10/21   Bonnielee Haff, MD  polyethylene glycol (MIRALAX / GLYCOLAX) 17 g packet Take 17 g by mouth daily. 05/01/21   Sherwood Gambler, MD  pravastatin (PRAVACHOL) 40 MG tablet Take 40 mg by mouth at bedtime. (2100)     [provider]  prednisoLONE (ORAPRED ODT) 10 MG disintegrating tablet Take 10 mg by mouth See admin instructions. Take 1 tablet (10 mg) by mouth for 2 days    [provider]  predniSONE (DELTASONE) 10 MG tablet Take 20-30 mg by mouth See admin instructions. Take 3 tablets (30 mg) by mouth daily in the evening for 2 days, then decrease to take 2 tablets (20 mg) by mouth in the evening for 2 days then discontinue.    [provider]  predniSONE (DELTASONE) 20 MG tablet Take 20 mg by mouth See admin instructions. Take 2 tablets (40 mg) by mouth in the evening for 2 days.    [provider]  tamsulosin (FLOMAX) 0.4 MG CAPS capsule Take 1 capsule (0.4 mg total) by mouth daily. 04/11/21   Bonnielee Haff, MD      Allergies    Bee venom and Influenza vaccines    Review of Systems   Review  of Systems  Musculoskeletal:  Positive for arthralgias. Negative for neck pain.  Neurological:  Negative for syncope.  All other systems reviewed and are negative.   Physical Exam Updated Vital Signs BP (!) 149/58   Pulse 81   Temp 98.1 F (36.7 C) (Oral)   Resp 14   SpO2 96%  Physical Exam Vitals and nursing note reviewed.  Constitutional:      General: He is not in acute distress.    Appearance: Normal appearance. He is not ill-appearing, toxic-appearing or diaphoretic.  HENT:     Head: Normocephalic and atraumatic.     Nose: Nose normal. No congestion.     Mouth/Throat:     Mouth: Mucous membranes are moist.     Pharynx: Oropharynx is clear.  Eyes:     Extraocular Movements: Extraocular movements intact.     Conjunctiva/sclera: Conjunctivae normal.     Pupils: Pupils are equal, round, and reactive to light.  Cardiovascular:     Rate and Rhythm: Normal rate and regular rhythm.  Pulmonary:     Effort: Pulmonary effort is normal.     Breath sounds: Normal breath sounds. No wheezing.  Chest:     Chest wall: Tenderness present.     Comments: Tenderness to  palpation of left-sided chest wall.  No overlying deformity, crepitus or overlying skin change. Abdominal:     General: Abdomen is flat. Bowel sounds are normal.     Palpations: Abdomen is soft.     Tenderness: There is no abdominal tenderness.  Musculoskeletal:     Cervical back: Normal range of motion and neck supple. No tenderness.     Right hip: Normal.     Left hip: Tenderness present. No deformity or lacerations. Decreased range of motion.     Comments: No shortening or rotation.  There is no obvious deformity.  There is no overlying skin change.  There is diffuse tenderness.  Skin:    General: Skin is warm and dry.     Capillary Refill: Capillary refill takes less than 2 seconds.  Neurological:     Mental Status: He is alert and oriented to person, place, and time.     ED Results / Procedures / Treatments   Labs (all labs ordered are listed, but only abnormal results are displayed) Labs Reviewed  CBC - Abnormal; Notable for the following components:      Result Value   WBC 13.3 (*)    RBC 3.60 (*)    Hemoglobin 10.6 (*)    HCT 34.2 (*)    RDW 17.4 (*)    All other components within normal limits  BASIC METABOLIC PANEL - Abnormal; Notable for the following components:   CO2 20 (*)    Glucose, Bld 102 (*)    BUN 85 (*)    Creatinine, Ser 6.32 (*)    Calcium 8.4 (*)    GFR, Estimated 8 (*)    Anion gap 18 (*)    All other components within normal limits    EKG None  Radiology DG Ribs Unilateral W/Chest Left  Result Date: 09/17/2021 CLINICAL DATA:  Left chest wall pain after fall. EXAM: LEFT RIBS AND CHEST - 3+ VIEW COMPARISON:  September 03, 2021. FINDINGS: No fracture or other bone lesions are seen involving the ribs. Mild left basilar subsegmental atelectasis is noted with small left pleural effusion. Heart size and mediastinal contours are within normal limits. IMPRESSION: No evidence of rib fracture. Mild left basilar subsegmental atelectasis is noted  with small left  pleural effusion. Electronically Signed   By: Marijo Conception M.D.   On: 09/17/2021 18:20   CT Hip Left Wo Contrast  Result Date: 09/17/2021 CLINICAL DATA:  Left hip pain.  Negative x-rays. EXAM: CT OF THE LEFT HIP WITHOUT CONTRAST TECHNIQUE: Multidetector CT imaging of the left hip was performed according to the standard protocol. Multiplanar CT image reconstructions were also generated. RADIATION DOSE REDUCTION: This exam was performed according to the departmental dose-optimization program which includes automated exposure control, adjustment of the mA and/or kV according to patient size and/or use of iterative reconstruction technique. COMPARISON:  Radiographs, same date. FINDINGS: The left hip is normally located. Moderate age related degenerative changes with joint space narrowing, osteophytic spurring and subchondral cystic change. No acute hip fracture is identified. No CT evidence of AVN. There are nondisplaced superior and inferior pubic rami fractures likely account for the patient's symptoms. The pubic symphysis is intact. The acetabulum is intact. No significant intrapelvic abnormalities are identified. There is severe diverticulosis of the sigmoid colon noted and there is advanced vascular calcifications. Lumbar spine fusion hardware is noted. No obvious complicating features. Advanced osteoporosis is noted. IMPRESSION: 1. Nondisplaced superior and inferior pubic rami fractures likely account for the patient's symptoms. No acute left hip fracture. 2. Moderate age related degenerative changes in the left hip. 3. Advanced osteoporosis. Electronically Signed   By: Marijo Sanes M.D.   On: 09/17/2021 15:36   DG Hip Unilat W or Wo Pelvis 2-3 Views Left  Result Date: 09/17/2021 CLINICAL DATA:  Hip pain after fall.  No rotation.  Mild shortening. EXAM: DG HIP (WITH OR WITHOUT PELVIS) 2-3V LEFT COMPARISON:  Left hip radiographs 09/03/2021 FINDINGS: No acute fracture or hip dislocation is identified.  Generalized osteopenia is noted. Patchy sclerosis is again seen in the left femoral head which may reflect avascular necrosis without evidence of collapse. Prior right total hip arthroplasty and lumbosacral fusion are again noted. Hyperdense material is again noted in the fecal stream. IMPRESSION: No acute osseous abnormality identified. Electronically Signed   By: Logan Bores M.D.   On: 09/17/2021 13:07    Procedures Procedures   Medications Ordered in ED Medications  HYDROmorphone (DILAUDID) injection 0.5 mg (0.5 mg Intravenous Given 09/17/21 1244)  HYDROmorphone (DILAUDID) injection 0.5 mg (0.5 mg Intravenous Given 09/17/21 1634)    ED Course/ Medical Decision Making/ A&P                           Medical Decision Making Amount and/or Complexity of Data Reviewed Radiology: ordered.  Risk Prescription drug management.   87 year old L presents to the ED for evaluation of left hip pain.  Please see HPI for further details.  On examination, the patient is afebrile and nontachycardic.  The patient lung sounds are clear bilaterally.  Patient is on chronic oxygen due to COPD.  Patient abdomen soft and compressible all 4 quadrants, no overlying skin change.  Patient left hip is not shortened or rotated.  There is diffuse tenderness, nonfocal.  Patient has decreased range of motion secondary to pain.  Patient worked up utilizing the following labs imaging studies interpreted by me personally: - BMP with elevated creatinine of 6.3 however patient is end-stage renal disease. - CBC with leukocytosis of 13.3 however patient afebrile and nontachycardic - Plain film imaging of patient hip shows no fracture however the patient does have extreme pain in unable to bear weight so we  will proceed with CT imaging - CT scan shows superior and inferior pubic rami fracture, nondisplaced. Dr. Sammuel Hines, orthopedic surgery, was consulted.  Dr. Sammuel Hines states that this injury is nonoperable.  Patient advised to  weight-bear as tolerated, provided with pain control measures.  Patient will follow-up with Dr. Sammuel Hines in his office this week. - Plain film imaging of patient left side of chest wall shows no rib fracture however there is small pleural effusion.  Patient counseled on splinting techniques, provided with incentive spirometer for pain  Patient treated with 0.5 mg of Dilaudid x2.  Patient reports pain is controlled at this time.  Patient will be discharged home and advised to follow-up with Dr. Sammuel Hines on his office for superior and inferior pubic rami fracture.  Patient provided with return precautions and he voiced understanding.  The patient had all of his questions answered to satisfaction.  The patient stable for discharge.  Final Clinical Impression(s) / ED Diagnoses Final diagnoses:  Closed fracture of multiple pubic rami, left, initial encounter [facility])    Rx / DC Orders ED Discharge Orders          Ordered    HYDROcodone-acetaminophen (NORCO/VICODIN) 5-325 MG tablet  Every 4 hours PRN        09/17/21 1842              Azucena Cecil, PA-C 52/84/13 2440    Lianne Cure, DO 12/20/23 2102

## 2021-09-17 NOTE — ED Notes (Addendum)
[facility] contacted at this time to make them aware that patient is headed back to facility at this time. All paperwork and belongings sent with patient and patient will be safely transported back to facility with [facility].

## 2021-09-17 NOTE — ED Notes (Signed)
Patient requesting breathing treatment at this time. Provider made aware of same.

## 2021-09-18 DIAGNOSIS — J449 Chronic obstructive pulmonary disease, unspecified: Secondary | ICD-10-CM | POA: Diagnosis not present

## 2021-09-18 DIAGNOSIS — I120 Hypertensive chronic kidney disease with stage 5 chronic kidney disease or end stage renal disease: Secondary | ICD-10-CM | POA: Diagnosis not present

## 2021-09-18 DIAGNOSIS — W19XXXD Unspecified fall, subsequent encounter: Secondary | ICD-10-CM | POA: Diagnosis not present

## 2021-09-18 DIAGNOSIS — G894 Chronic pain syndrome: Secondary | ICD-10-CM | POA: Diagnosis not present

## 2021-09-18 DIAGNOSIS — L89134 Pressure ulcer of right lower back, stage 4: Secondary | ICD-10-CM | POA: Diagnosis not present

## 2021-09-22 NOTE — Telephone Encounter (Signed)
This appt has been made I was not able to complete the task. Stpegram

## 2021-09-23 DIAGNOSIS — N139 Obstructive and reflux uropathy, unspecified: Secondary | ICD-10-CM | POA: Diagnosis not present

## 2021-09-23 DIAGNOSIS — F419 Anxiety disorder, unspecified: Secondary | ICD-10-CM | POA: Diagnosis not present

## 2021-09-23 DIAGNOSIS — Z992 Dependence on renal dialysis: Secondary | ICD-10-CM | POA: Diagnosis not present

## 2021-09-23 DIAGNOSIS — G894 Chronic pain syndrome: Secondary | ICD-10-CM | POA: Diagnosis not present

## 2021-09-23 DIAGNOSIS — M6259 Muscle wasting and atrophy, not elsewhere classified, multiple sites: Secondary | ICD-10-CM | POA: Diagnosis not present

## 2021-09-23 DIAGNOSIS — K861 Other chronic pancreatitis: Secondary | ICD-10-CM | POA: Diagnosis not present

## 2021-09-23 DIAGNOSIS — I679 Cerebrovascular disease, unspecified: Secondary | ICD-10-CM | POA: Diagnosis not present

## 2021-09-23 DIAGNOSIS — E1122 Type 2 diabetes mellitus with diabetic chronic kidney disease: Secondary | ICD-10-CM | POA: Diagnosis not present

## 2021-09-23 DIAGNOSIS — J439 Emphysema, unspecified: Secondary | ICD-10-CM | POA: Diagnosis not present

## 2021-09-23 DIAGNOSIS — I739 Peripheral vascular disease, unspecified: Secondary | ICD-10-CM | POA: Diagnosis not present

## 2021-09-23 DIAGNOSIS — J449 Chronic obstructive pulmonary disease, unspecified: Secondary | ICD-10-CM | POA: Diagnosis not present

## 2021-09-23 DIAGNOSIS — I120 Hypertensive chronic kidney disease with stage 5 chronic kidney disease or end stage renal disease: Secondary | ICD-10-CM | POA: Diagnosis not present

## 2021-09-23 DIAGNOSIS — D631 Anemia in chronic kidney disease: Secondary | ICD-10-CM | POA: Diagnosis not present

## 2021-09-23 DIAGNOSIS — M6281 Muscle weakness (generalized): Secondary | ICD-10-CM | POA: Diagnosis not present

## 2021-09-23 DIAGNOSIS — R262 Difficulty in walking, not elsewhere classified: Secondary | ICD-10-CM | POA: Diagnosis not present

## 2021-09-23 DIAGNOSIS — R293 Abnormal posture: Secondary | ICD-10-CM | POA: Diagnosis not present

## 2021-09-23 DIAGNOSIS — R2681 Unsteadiness on feet: Secondary | ICD-10-CM | POA: Diagnosis not present

## 2021-09-23 DIAGNOSIS — R41841 Cognitive communication deficit: Secondary | ICD-10-CM | POA: Diagnosis not present

## 2021-09-24 ENCOUNTER — Ambulatory Visit (INDEPENDENT_AMBULATORY_CARE_PROVIDER_SITE_OTHER): Payer: No Typology Code available for payment source | Admitting: Orthopaedic Surgery

## 2021-09-24 ENCOUNTER — Ambulatory Visit (HOSPITAL_BASED_OUTPATIENT_CLINIC_OR_DEPARTMENT_OTHER): Payer: Medicare Other

## 2021-09-24 ENCOUNTER — Telehealth: Payer: Self-pay | Admitting: *Deleted

## 2021-09-24 DIAGNOSIS — I5033 Acute on chronic diastolic (congestive) heart failure: Secondary | ICD-10-CM | POA: Diagnosis not present

## 2021-09-24 DIAGNOSIS — S32592A Other specified fracture of left pubis, initial encounter for closed fracture: Secondary | ICD-10-CM | POA: Diagnosis not present

## 2021-09-24 DIAGNOSIS — J449 Chronic obstructive pulmonary disease, unspecified: Secondary | ICD-10-CM | POA: Diagnosis not present

## 2021-09-24 DIAGNOSIS — R531 Weakness: Secondary | ICD-10-CM | POA: Diagnosis not present

## 2021-09-24 DIAGNOSIS — N186 End stage renal disease: Secondary | ICD-10-CM | POA: Diagnosis not present

## 2021-09-24 DIAGNOSIS — E46 Unspecified protein-calorie malnutrition: Secondary | ICD-10-CM | POA: Diagnosis not present

## 2021-09-24 DIAGNOSIS — E1122 Type 2 diabetes mellitus with diabetic chronic kidney disease: Secondary | ICD-10-CM | POA: Diagnosis not present

## 2021-09-24 DIAGNOSIS — K861 Other chronic pancreatitis: Secondary | ICD-10-CM | POA: Diagnosis not present

## 2021-09-24 DIAGNOSIS — F322 Major depressive disorder, single episode, severe without psychotic features: Secondary | ICD-10-CM | POA: Diagnosis not present

## 2021-09-24 DIAGNOSIS — J189 Pneumonia, unspecified organism: Secondary | ICD-10-CM | POA: Diagnosis not present

## 2021-09-24 DIAGNOSIS — J9621 Acute and chronic respiratory failure with hypoxia: Secondary | ICD-10-CM | POA: Diagnosis not present

## 2021-09-24 DIAGNOSIS — I679 Cerebrovascular disease, unspecified: Secondary | ICD-10-CM | POA: Diagnosis not present

## 2021-09-24 DIAGNOSIS — N390 Urinary tract infection, site not specified: Secondary | ICD-10-CM | POA: Diagnosis not present

## 2021-09-24 DIAGNOSIS — S329XXA Fracture of unspecified parts of lumbosacral spine and pelvis, initial encounter for closed fracture: Secondary | ICD-10-CM | POA: Diagnosis not present

## 2021-09-24 DIAGNOSIS — M6259 Muscle wasting and atrophy, not elsewhere classified, multiple sites: Secondary | ICD-10-CM | POA: Diagnosis not present

## 2021-09-24 DIAGNOSIS — D500 Iron deficiency anemia secondary to blood loss (chronic): Secondary | ICD-10-CM | POA: Diagnosis not present

## 2021-09-24 DIAGNOSIS — R262 Difficulty in walking, not elsewhere classified: Secondary | ICD-10-CM | POA: Diagnosis not present

## 2021-09-24 NOTE — Telephone Encounter (Signed)
[place] Nurse called asking if the NP at there facility should remove patients sutures. I advised her they were not to take out sutures that will be done at patients post op visit 09/30/21. Reine Just verbalized understanding.

## 2021-09-24 NOTE — Progress Notes (Signed)
Chief Complaint: Left hip SPR fracture     History of Present Illness:    Wayne Meadows is a 87 y.o. male presents today for follow-up after hospital admission with probable left hip superior pubic ramus fracture.  At this time he continues to be at a rehab.  He has been able to weight-bear without significant difficulty using a walker because of pain while weightbearing.  He has no pain at rest overall continues to make improvement daily    Surgical History:   None  PMH/PSH/Family History/Social History/Meds/Allergies:    Past Medical History:  Diagnosis Date   Abnormal echocardiogram 03/08/2021   Abnormal finding on GI tract imaging    Acute blood loss anemia    Acute pancreatitis 08/12/2020   Arthritis    Carotid stenosis    Community acquired pneumonia 04/06/2021   COPD (chronic obstructive pulmonary disease) ([place])    Coronary artery disease    S/p PCI 2011;  NSTEMI 12/12:  LHC/PCI 02/23/11: LAD 60% after the septal perforator, D1 occluded with distal collaterals, proximal RI 30-40%, AV circumflex stent patent with 60% stenosis after the stent, RCA 99%, EF 60-65%.  His RCA was treated with a bare-metal stent   CVA (cerebral infarction) 2011   Right cerebral; total obstruction of the right ICA   Diverticulitis    Hypertension    Paroxysmal atrial fibrillation with RVR ([place]) 04/09/2021   Pleuritic chest pain 04/08/2014   Pressure injury of skin 03/30/2021   Rash 03/05/2021   Rectal bleeding 07/2015   Refractory nausea and vomiting 06/20/2020   Renal carcinoma ([place])    Sepsis, unspecified organism ([place]) 04/11/2016   Stroke ([place])    Tobacco abuse, in remission    Past Surgical History:  Procedure Laterality Date   AV FISTULA PLACEMENT Right 04/03/2021   Procedure: ARTERIOVENOUS (AV) CREATION OF RIGHT ARM BRACHIOCEPHALIC FISTULA;  Surgeon: Cherre Robins, MD;  Location: [facility];  Service: Vascular;  Laterality: Right;   BIOPSY  04/08/2021    Procedure: BIOPSY;  Surgeon: Lavena Bullion, DO;  Location: [facility];  Service: Gastroenterology;;   COLON SURGERY     COLONOSCOPY N/A 08/22/2015   Procedure: COLONOSCOPY;  Surgeon: [name], MD;  Location: [facility];  Service: Endoscopy;  Laterality: N/A;   ESOPHAGOGASTRODUODENOSCOPY N/A 07/18/2020   Procedure: ESOPHAGOGASTRODUODENOSCOPY (EGD);  Surgeon: Milus Banister, MD;  Location: [facility];  Service: Endoscopy;  Laterality: N/A;   ESOPHAGOGASTRODUODENOSCOPY (EGD) WITH PROPOFOL N/A 06/21/2020   Procedure: ESOPHAGOGASTRODUODENOSCOPY (EGD) WITH PROPOFOL;  Surgeon: Irene Shipper, MD;  Location: [facility] ENDOSCOPY;  Service: Endoscopy;  Laterality: N/A;   ESOPHAGOGASTRODUODENOSCOPY (EGD) WITH PROPOFOL N/A 04/08/2021   Procedure: ESOPHAGOGASTRODUODENOSCOPY (EGD) WITH PROPOFOL;  Surgeon: Lavena Bullion, DO;  Location: [facility];  Service: Gastroenterology;  Laterality: N/A;   EUS N/A 07/18/2020   Procedure: UPPER ENDOSCOPIC ULTRASOUND (EUS) RADIAL;  Surgeon: Milus Banister, MD;  Location: [facility];  Service: Endoscopy;  Laterality: N/A;   FINE NEEDLE ASPIRATION N/A 07/18/2020   Procedure: FINE NEEDLE ASPIRATION (FNA) LINEAR;  Surgeon: Milus Banister, MD;  Location: [facility];  Service: Endoscopy;  Laterality: N/A;   HEMOSTASIS CLIP PLACEMENT  04/08/2021   Procedure: HEMOSTASIS CLIP PLACEMENT;  Surgeon: Lavena Bullion, DO;  Location: [facility] ENDOSCOPY;  Service: Gastroenterology;;   hip relacement  INCISION AND DRAINAGE Right 09/11/2021   Procedure: INCISION AND DRAINAGE OF RIGHT ARM FISTULA;  Surgeon: Cherre Robins, MD;  Location: [facility] OR;  Service: Vascular;  Laterality: Right;   IR FLUORO GUIDE CV LINE RIGHT  03/31/2021   IR REMOVAL TUN CV CATH W/O FL  08/15/2021   IR US GUIDE VASC ACCESS RIGHT  03/31/2021   KIDNEY SURGERY     LEFT HEART CATH AND CORONARY ANGIOGRAPHY N/A 07/09/2020   Procedure: LEFT HEART CATH AND CORONARY ANGIOGRAPHY;  Surgeon: Leonie Man,  MD;  Location: [facility] CV [facility];  Service: Cardiovascular;  Laterality: N/A;   LEFT HEART CATHETERIZATION WITH CORONARY ANGIOGRAM N/A 02/23/2011   Procedure: LEFT HEART CATHETERIZATION WITH CORONARY ANGIOGRAM;  Surgeon: [name], MD;  Location: [facility] CATH LAB;  Service: Cardiovascular;  Laterality: N/A;   LEFT HEART CATHETERIZATION WITH CORONARY ANGIOGRAM N/A 03/18/2011   Procedure: LEFT HEART CATHETERIZATION WITH CORONARY ANGIOGRAM;  Surgeon: [name], MD;  Location: [facility] CATH LAB;  Service: Cardiovascular;  Laterality: N/A;   PERCUTANEOUS CORONARY STENT INTERVENTION (PCI-S) N/A 02/23/2011   Procedure: PERCUTANEOUS CORONARY STENT INTERVENTION (PCI-S);  Surgeon: [name], MD;  Location: [facility] CATH LAB;  Service: Cardiovascular;  Laterality: N/A;   TEMPORARY PACEMAKER INSERTION N/A 02/23/2011   Procedure: TEMPORARY PACEMAKER INSERTION;  Surgeon: [name], MD;  Location: [facility] CATH LAB;  Service: Cardiovascular;  Laterality: N/A;   THROMBECTOMY W/ EMBOLECTOMY Right 04/03/2021   Procedure: THROMBECTOMY ARTERIOVENOUS FISTULA;  Surgeon: Cherre Robins, MD;  Location: [facility] OR;  Service: Vascular;  Laterality: Right;   Social History   Socioeconomic History   Marital status: Divorced    Spouse name: Not on file   Number of children: Not on file   Years of education: Not on file   Highest education level: Not on file  Occupational History   Not on file  Tobacco Use   Smoking status: Former    Types: Cigarettes    Quit date: 02/24/2007    Years since quitting: 14.5   Smokeless tobacco: Never  Vaping Use   Vaping Use: Never used  Substance and Sexual Activity   Alcohol use: Not Currently    Alcohol/week: 1.0 standard drink of alcohol    Types: 1 Standard drinks or equivalent per week    Comment: socially    Drug use: No   Sexual activity: Not on file  Other Topics Concern   Not on file  Social History Narrative   Not on file   Social Determinants of Health   Financial  Resource Strain: Not on file  Food Insecurity: Not on file  Transportation Needs: Not on file  Physical Activity: Not on file  Stress: Not on file  Social Connections: Not on file   Family History  Problem Relation Age of Onset   Heart attack Other 62   Allergies  Allergen Reactions   Bee Venom Anaphylaxis    Has epi pen   Influenza Vaccines Other (See Comments)    "Mortally sick for 2 weeks"   Current Outpatient Medications  Medication Sig Dispense Refill   acetaminophen (TYLENOL) 325 MG tablet Take 2 tablets (650 mg total) by mouth every 6 (six) hours as needed for mild pain (or Fever >/= 101).     albuterol (VENTOLIN HFA) 108 (90 Base) MCG/ACT inhaler Inhale 2 puffs into the lungs every 6 (six) hours as needed for shortness of breath.     Amino Acids-Protein Hydrolys (FEEDING SUPPLEMENT, PRO-STAT SUGAR  FREE 64,) LIQD Take 30 mLs by mouth every evening. (1700)     aspirin EC 81 MG tablet Take 81 mg by mouth daily.  (0900)     bisacodyl (DULCOLAX) 10 MG suppository Place 10 mg rectally daily as needed for moderate constipation.     busPIRone (BUSPAR) 5 MG tablet Take 5 mg by mouth 2 (two) times daily. (0800 & 2000)     camphor-menthol (SARNA) lotion Apply topically as needed for itching. (Patient taking differently: Apply 1 Application topically as needed for itching.) 222 mL 0   cloNIDine (CATAPRES) 0.1 MG tablet Take 1 tablet (0.1 mg total) by mouth daily. 30 tablet 0   diltiazem (CARDIZEM CD) 180 MG 24 hr capsule Take 1 capsule (180 mg total) by mouth daily.     docusate sodium (COLACE) 100 MG capsule Take 1 capsule (100 mg total) by mouth every 12 (twelve) hours. 60 capsule 0   ferrous sulfate 325 (65 FE) MG tablet Take 1 tablet by mouth every Monday, Wednesday, and Friday. (0900)     guaiFENesin 300 MG/15ML LIQD Take 600 mg by mouth every 12 (twelve) hours. (0900 & 2100)     hydrALAZINE (APRESOLINE) 10 MG tablet Take 1 tablet (10 mg total) by mouth every 8 (eight) hours as  needed (for SBP greater than 180). 90 tablet 0   HYDROcodone-acetaminophen (NORCO/VICODIN) 5-325 MG tablet Take 1 tablet by mouth every 4 (four) hours as needed for severe pain. 15 tablet 0   hydrOXYzine (ATARAX) 10 MG tablet Take 10 mg by mouth 3 (three) times daily as needed for itching.     Ipratropium-Albuterol (COMBIVENT) 20-100 MCG/ACT AERS respimat Inhale 1 puff into the lungs 4 (four) times daily. (0900, 1300, 1700 & 2100)     ipratropium-albuterol (DUONEB) 0.5-2.5 (3) MG/3ML SOLN Take 3 mLs by nebulization every 4 (four) hours as needed (shortness of breath/wheezing).     lanthanum (FOSRENOL) 1000 MG chewable tablet Chew 1 tablet (1,000 mg total) by mouth 3 (three) times daily with meals. 90 tablet 0   levothyroxine (SYNTHROID) 50 MCG tablet Take 50 mcg by mouth daily before breakfast. (0630)     melatonin 5 MG TABS Take 5 mg by mouth at bedtime. (2100)     Menthol, Topical Analgesic, (BIOFREEZE) 4 % GEL Apply 1 Application topically 3 (three) times daily as needed (pain).     Multiple Vitamin (MULTIVITAMIN WITH MINERALS) TABS tablet Take 1 tablet by mouth daily. (0900)     Multiple Vitamins-Minerals (DECUBI-VITE PO) Take 1 tablet by mouth in the morning. (0900)     naphazoline-glycerin (CLEAR EYES REDNESS) 0.012-0.25 % SOLN Place 1-2 drops into the right eye 3 (three) times daily.     neomycin-bacitracin-polymyxin (NEOSPORIN) OINT Apply 1 application topically 3 (three) times daily. To right antecubital area     Nutritional Supplements (,FEEDING SUPPLEMENT, PROSOURCE PLUS) liquid Take 30 mLs by mouth 2 (two) times daily between meals. (Patient not taking: Reported on 09/10/2021)     Nutritional Supplements (FEEDING SUPPLEMENT, NEPRO CARB STEADY,) LIQD Take 237 mLs by mouth 3 (three) times daily before meals. (Patient not taking: Reported on 09/10/2021)  0   Nutritional Supplements (NOVASOURCE RENAL) LIQD Take 237 mLs by mouth in the morning and at bedtime. (0800 & 1700)     ondansetron  (ZOFRAN) 4 MG tablet Take 1 tablet (4 mg total) by mouth daily as needed for nausea or vomiting. 30 tablet 1   oxyCODONE (OXY IR/ROXICODONE) 5 MG immediate release tablet Take 1  tablet (5 mg total) by mouth every 4 (four) hours as needed (pain.). 10 tablet 0   pantoprazole (PROTONIX) 40 MG tablet Take 1 tablet (40 mg total) by mouth 2 (two) times daily.     polyethylene glycol (MIRALAX / GLYCOLAX) 17 g packet Take 17 g by mouth daily. 14 each 0   pravastatin (PRAVACHOL) 40 MG tablet Take 40 mg by mouth at bedtime. (2100)     prednisoLONE (ORAPRED ODT) 10 MG disintegrating tablet Take 10 mg by mouth See admin instructions. Take 1 tablet (10 mg) by mouth for 2 days     predniSONE (DELTASONE) 10 MG tablet Take 20-30 mg by mouth See admin instructions. Take 3 tablets (30 mg) by mouth daily in the evening for 2 days, then decrease to take 2 tablets (20 mg) by mouth in the evening for 2 days then discontinue.     predniSONE (DELTASONE) 20 MG tablet Take 20 mg by mouth See admin instructions. Take 2 tablets (40 mg) by mouth in the evening for 2 days.     tamsulosin (FLOMAX) 0.4 MG CAPS capsule Take 1 capsule (0.4 mg total) by mouth daily. 30 capsule    No current facility-administered medications for this visit.   No results found.  Review of Systems:   A ROS was performed including pertinent positives and negatives as documented in the HPI.  Physical Exam :   Constitutional: NAD and appears stated age Neurological: Alert and oriented Psych: Appropriate affect and cooperative There were no vitals taken for this visit.   Comprehensive Musculoskeletal Exam:    Tenderness palpation about left groin.  He is able to stand.  He is in a wheelchair here.  He is 30 degrees internal and external rotation of the hip without pain.  Able to do a straight leg raise with significant weakness  Imaging:   Xray (3 views left hip): Nondisplaced superior pubic ramus fracture   I personally reviewed and  interpreted the radiographs.   Assessment:   87 y.o. male with a left superior pubic ramus fracture nondisplaced after a fall.  Overall he continues to improve daily.  Advised that given the fracture is minimally displaced and would not necessarily recommend x-ray follow-up.  I will plan to see him back on an as-needed basis  Plan :    -Return to clinic as needed     I personally saw and evaluated the patient, and participated in the management and treatment plan.  Vanetta Mulders, MD Attending Physician, Orthopedic Surgery  This document was dictated using Dragon voice recognition software. A reasonable attempt at proof reading has been made to minimize errors.

## 2021-09-25 ENCOUNTER — Other Ambulatory Visit: Payer: Self-pay | Admitting: *Deleted

## 2021-09-25 DIAGNOSIS — I679 Cerebrovascular disease, unspecified: Secondary | ICD-10-CM | POA: Diagnosis not present

## 2021-09-25 DIAGNOSIS — F419 Anxiety disorder, unspecified: Secondary | ICD-10-CM | POA: Diagnosis not present

## 2021-09-25 DIAGNOSIS — R262 Difficulty in walking, not elsewhere classified: Secondary | ICD-10-CM | POA: Diagnosis not present

## 2021-09-25 DIAGNOSIS — L89134 Pressure ulcer of right lower back, stage 4: Secondary | ICD-10-CM | POA: Diagnosis not present

## 2021-09-25 DIAGNOSIS — J449 Chronic obstructive pulmonary disease, unspecified: Secondary | ICD-10-CM | POA: Diagnosis not present

## 2021-09-25 DIAGNOSIS — M6259 Muscle wasting and atrophy, not elsewhere classified, multiple sites: Secondary | ICD-10-CM | POA: Diagnosis not present

## 2021-09-25 DIAGNOSIS — N390 Urinary tract infection, site not specified: Secondary | ICD-10-CM | POA: Diagnosis not present

## 2021-09-25 DIAGNOSIS — K861 Other chronic pancreatitis: Secondary | ICD-10-CM | POA: Diagnosis not present

## 2021-09-25 DIAGNOSIS — E1122 Type 2 diabetes mellitus with diabetic chronic kidney disease: Secondary | ICD-10-CM | POA: Diagnosis not present

## 2021-09-25 DIAGNOSIS — G894 Chronic pain syndrome: Secondary | ICD-10-CM | POA: Diagnosis not present

## 2021-09-25 NOTE — Progress Notes (Unsigned)
POST OPERATIVE OFFICE NOTE    CC:  F/u for surgery  HPI:  This is a 87 y.o. male who is s/p Incision and drainage of right arm hematoma 09/11/21 by Dr. Stanford Breed. He has a right upper extremity BC AV fistula created on 04/03/21. His arm is doing well  Allergies  Allergen Reactions   Bee Venom Anaphylaxis    Has epi pen   Influenza Vaccines Other (See Comments)    "Mortally sick for 2 weeks"    Current Outpatient Medications  Medication Sig Dispense Refill   acetaminophen (TYLENOL) 325 MG tablet Take 2 tablets (650 mg total) by mouth every 6 (six) hours as needed for mild pain (or Fever >/= 101).     albuterol (VENTOLIN HFA) 108 (90 Base) MCG/ACT inhaler Inhale 2 puffs into the lungs every 6 (six) hours as needed for shortness of breath.     Amino Acids-Protein Hydrolys (FEEDING SUPPLEMENT, PRO-STAT SUGAR FREE 64,) LIQD Take 30 mLs by mouth every evening. (1700)     aspirin EC 81 MG tablet Take 81 mg by mouth daily.  (0900)     bisacodyl (DULCOLAX) 10 MG suppository Place 10 mg rectally daily as needed for moderate constipation.     busPIRone (BUSPAR) 5 MG tablet Take 5 mg by mouth 2 (two) times daily. (0800 & 2000)     camphor-menthol (SARNA) lotion Apply topically as needed for itching. (Patient taking differently: Apply 1 Application topically as needed for itching.) 222 mL 0   cloNIDine (CATAPRES) 0.1 MG tablet Take 1 tablet (0.1 mg total) by mouth daily. 30 tablet 0   diltiazem (CARDIZEM CD) 180 MG 24 hr capsule Take 1 capsule (180 mg total) by mouth daily.     docusate sodium (COLACE) 100 MG capsule Take 1 capsule (100 mg total) by mouth every 12 (twelve) hours. 60 capsule 0   ferrous sulfate 325 (65 FE) MG tablet Take 1 tablet by mouth every Monday, Wednesday, and Friday. (0900)     guaiFENesin 300 MG/15ML LIQD Take 600 mg by mouth every 12 (twelve) hours. (0900 & 2100)     hydrALAZINE (APRESOLINE) 10 MG tablet Take 1 tablet (10 mg total) by mouth every 8 (eight) hours as needed (for  SBP greater than 180). 90 tablet 0   HYDROcodone-acetaminophen (NORCO/VICODIN) 5-325 MG tablet Take 1 tablet by mouth every 4 (four) hours as needed for severe pain. 15 tablet 0   hydrOXYzine (ATARAX) 10 MG tablet Take 10 mg by mouth 3 (three) times daily as needed for itching.     Ipratropium-Albuterol (COMBIVENT) 20-100 MCG/ACT AERS respimat Inhale 1 puff into the lungs 4 (four) times daily. (0900, 1300, 1700 & 2100)     ipratropium-albuterol (DUONEB) 0.5-2.5 (3) MG/3ML SOLN Take 3 mLs by nebulization every 4 (four) hours as needed (shortness of breath/wheezing).     lanthanum (FOSRENOL) 1000 MG chewable tablet Chew 1 tablet (1,000 mg total) by mouth 3 (three) times daily with meals. 90 tablet 0   levothyroxine (SYNTHROID) 50 MCG tablet Take 50 mcg by mouth daily before breakfast. (0630)     melatonin 5 MG TABS Take 5 mg by mouth at bedtime. (2100)     Menthol, Topical Analgesic, (BIOFREEZE) 4 % GEL Apply 1 Application topically 3 (three) times daily as needed (pain).     Multiple Vitamin (MULTIVITAMIN WITH MINERALS) TABS tablet Take 1 tablet by mouth daily. (0900)     Multiple Vitamins-Minerals (DECUBI-VITE PO) Take 1 tablet by mouth in the morning. (0900)  naphazoline-glycerin (CLEAR EYES REDNESS) 0.012-0.25 % SOLN Place 1-2 drops into the right eye 3 (three) times daily.     neomycin-bacitracin-polymyxin (NEOSPORIN) OINT Apply 1 application topically 3 (three) times daily. To right antecubital area     Nutritional Supplements (,FEEDING SUPPLEMENT, PROSOURCE PLUS) liquid Take 30 mLs by mouth 2 (two) times daily between meals. (Patient not taking: Reported on 09/10/2021)     Nutritional Supplements (FEEDING SUPPLEMENT, NEPRO CARB STEADY,) LIQD Take 237 mLs by mouth 3 (three) times daily before meals. (Patient not taking: Reported on 09/10/2021)  0   Nutritional Supplements (NOVASOURCE RENAL) LIQD Take 237 mLs by mouth in the morning and at bedtime. (0800 & 1700)     ondansetron (ZOFRAN) 4 MG  tablet Take 1 tablet (4 mg total) by mouth daily as needed for nausea or vomiting. 30 tablet 1   oxyCODONE (OXY IR/ROXICODONE) 5 MG immediate release tablet Take 1 tablet (5 mg total) by mouth every 4 (four) hours as needed (pain.). 10 tablet 0   pantoprazole (PROTONIX) 40 MG tablet Take 1 tablet (40 mg total) by mouth 2 (two) times daily.     polyethylene glycol (MIRALAX / GLYCOLAX) 17 g packet Take 17 g by mouth daily. 14 each 0   pravastatin (PRAVACHOL) 40 MG tablet Take 40 mg by mouth at bedtime. (2100)     prednisoLONE (ORAPRED ODT) 10 MG disintegrating tablet Take 10 mg by mouth See admin instructions. Take 1 tablet (10 mg) by mouth for 2 days     predniSONE (DELTASONE) 10 MG tablet Take 20-30 mg by mouth See admin instructions. Take 3 tablets (30 mg) by mouth daily in the evening for 2 days, then decrease to take 2 tablets (20 mg) by mouth in the evening for 2 days then discontinue.     predniSONE (DELTASONE) 20 MG tablet Take 20 mg by mouth See admin instructions. Take 2 tablets (40 mg) by mouth in the evening for 2 days.     tamsulosin (FLOMAX) 0.4 MG CAPS capsule Take 1 capsule (0.4 mg total) by mouth daily. 30 capsule    No current facility-administered medications for this visit.     ROS:  See HPI  Physical Exam:  There were no vitals filed for this visit.  Incision:  *** Extremities:  *** Neuro: *** Abdomen:  ***  Assessment/Plan:  This is a 87 y.o. male who is s/p: Incision and drainage of right arm hematoma 09/11/21 by Dr. Stanford Breed. ***  -***   Karoline Caldwell, PA-C [facility] MD:  Roxanne Mins

## 2021-09-25 NOTE — Patient Outreach (Signed)
Wayne Meadows resides in [facility].   Verified with [facility] SW Wayne Meadows is a long term care resident at [facility] under [place] benefit.  No identifiable care coordination needs.    Marthenia Rolling, MSN, RN,BSN [place] Acute Care Coordinator [phone] [facility]) [phone]  (Toll free office)

## 2021-09-26 DIAGNOSIS — K861 Other chronic pancreatitis: Secondary | ICD-10-CM | POA: Diagnosis not present

## 2021-09-26 DIAGNOSIS — M6259 Muscle wasting and atrophy, not elsewhere classified, multiple sites: Secondary | ICD-10-CM | POA: Diagnosis not present

## 2021-09-26 DIAGNOSIS — I679 Cerebrovascular disease, unspecified: Secondary | ICD-10-CM | POA: Diagnosis not present

## 2021-09-26 DIAGNOSIS — E1122 Type 2 diabetes mellitus with diabetic chronic kidney disease: Secondary | ICD-10-CM | POA: Diagnosis not present

## 2021-09-26 DIAGNOSIS — R262 Difficulty in walking, not elsewhere classified: Secondary | ICD-10-CM | POA: Diagnosis not present

## 2021-09-26 DIAGNOSIS — J449 Chronic obstructive pulmonary disease, unspecified: Secondary | ICD-10-CM | POA: Diagnosis not present

## 2021-09-28 ENCOUNTER — Other Ambulatory Visit: Payer: Self-pay

## 2021-09-28 ENCOUNTER — Encounter (HOSPITAL_COMMUNITY): Payer: Self-pay | Admitting: Emergency Medicine

## 2021-09-28 ENCOUNTER — Inpatient Hospital Stay (HOSPITAL_COMMUNITY)
Admission: EM | Admit: 2021-09-28 | Discharge: 2021-09-30 | DRG: 193 | Disposition: A | Discharge status: Skilled Nursing Facility | Payer: No Typology Code available for payment source | Source: Skilled Nursing Facility | Attending: Internal Medicine | Admitting: Internal Medicine

## 2021-09-28 ENCOUNTER — Emergency Department (HOSPITAL_COMMUNITY): Payer: No Typology Code available for payment source

## 2021-09-28 DIAGNOSIS — E785 Hyperlipidemia, unspecified: Secondary | ICD-10-CM | POA: Diagnosis present

## 2021-09-28 DIAGNOSIS — J189 Pneumonia, unspecified organism: Secondary | ICD-10-CM | POA: Diagnosis not present

## 2021-09-28 DIAGNOSIS — R296 Repeated falls: Secondary | ICD-10-CM | POA: Diagnosis present

## 2021-09-28 DIAGNOSIS — I2510 Atherosclerotic heart disease of native coronary artery without angina pectoris: Secondary | ICD-10-CM | POA: Diagnosis present

## 2021-09-28 DIAGNOSIS — I480 Paroxysmal atrial fibrillation: Secondary | ICD-10-CM | POA: Diagnosis present

## 2021-09-28 DIAGNOSIS — I471 Supraventricular tachycardia: Secondary | ICD-10-CM | POA: Diagnosis present

## 2021-09-28 DIAGNOSIS — I120 Hypertensive chronic kidney disease with stage 5 chronic kidney disease or end stage renal disease: Secondary | ICD-10-CM | POA: Diagnosis present

## 2021-09-28 DIAGNOSIS — I252 Old myocardial infarction: Secondary | ICD-10-CM

## 2021-09-28 DIAGNOSIS — Z8249 Family history of ischemic heart disease and other diseases of the circulatory system: Secondary | ICD-10-CM

## 2021-09-28 DIAGNOSIS — Z85528 Personal history of other malignant neoplasm of kidney: Secondary | ICD-10-CM

## 2021-09-28 DIAGNOSIS — J441 Chronic obstructive pulmonary disease with (acute) exacerbation: Secondary | ICD-10-CM | POA: Diagnosis present

## 2021-09-28 DIAGNOSIS — J449 Chronic obstructive pulmonary disease, unspecified: Secondary | ICD-10-CM | POA: Diagnosis present

## 2021-09-28 DIAGNOSIS — R946 Abnormal results of thyroid function studies: Secondary | ICD-10-CM | POA: Diagnosis present

## 2021-09-28 DIAGNOSIS — Z79890 Hormone replacement therapy (postmenopausal): Secondary | ICD-10-CM

## 2021-09-28 DIAGNOSIS — I4430 Unspecified atrioventricular block: Secondary | ICD-10-CM | POA: Diagnosis present

## 2021-09-28 DIAGNOSIS — I4892 Unspecified atrial flutter: Secondary | ICD-10-CM | POA: Diagnosis not present

## 2021-09-28 DIAGNOSIS — Z91030 Bee allergy status: Secondary | ICD-10-CM

## 2021-09-28 DIAGNOSIS — Z87891 Personal history of nicotine dependence: Secondary | ICD-10-CM

## 2021-09-28 DIAGNOSIS — E875 Hyperkalemia: Secondary | ICD-10-CM | POA: Diagnosis present

## 2021-09-28 DIAGNOSIS — I4891 Unspecified atrial fibrillation: Secondary | ICD-10-CM | POA: Diagnosis present

## 2021-09-28 DIAGNOSIS — Z7901 Long term (current) use of anticoagulants: Secondary | ICD-10-CM

## 2021-09-28 DIAGNOSIS — Z7982 Long term (current) use of aspirin: Secondary | ICD-10-CM

## 2021-09-28 DIAGNOSIS — D631 Anemia in chronic kidney disease: Secondary | ICD-10-CM | POA: Diagnosis present

## 2021-09-28 DIAGNOSIS — E039 Hypothyroidism, unspecified: Secondary | ICD-10-CM | POA: Diagnosis present

## 2021-09-28 DIAGNOSIS — R0689 Other abnormalities of breathing: Secondary | ICD-10-CM | POA: Diagnosis not present

## 2021-09-28 DIAGNOSIS — R059 Cough, unspecified: Secondary | ICD-10-CM | POA: Diagnosis not present

## 2021-09-28 DIAGNOSIS — R000 Tachycardia, unspecified: Secondary | ICD-10-CM | POA: Diagnosis not present

## 2021-09-28 DIAGNOSIS — M898X9 Other specified disorders of bone, unspecified site: Secondary | ICD-10-CM | POA: Diagnosis present

## 2021-09-28 DIAGNOSIS — Z888 Allergy status to other drugs, medicaments and biological substances status: Secondary | ICD-10-CM

## 2021-09-28 DIAGNOSIS — J440 Chronic obstructive pulmonary disease with acute lower respiratory infection: Secondary | ICD-10-CM | POA: Diagnosis present

## 2021-09-28 DIAGNOSIS — J9611 Chronic respiratory failure with hypoxia: Secondary | ICD-10-CM | POA: Diagnosis present

## 2021-09-28 DIAGNOSIS — Z8673 Personal history of transient ischemic attack (TIA), and cerebral infarction without residual deficits: Secondary | ICD-10-CM

## 2021-09-28 DIAGNOSIS — Z79899 Other long term (current) drug therapy: Secondary | ICD-10-CM

## 2021-09-28 DIAGNOSIS — N186 End stage renal disease: Secondary | ICD-10-CM | POA: Diagnosis present

## 2021-09-28 DIAGNOSIS — I10 Essential (primary) hypertension: Secondary | ICD-10-CM | POA: Diagnosis present

## 2021-09-28 DIAGNOSIS — R062 Wheezing: Secondary | ICD-10-CM | POA: Diagnosis not present

## 2021-09-28 DIAGNOSIS — Z992 Dependence on renal dialysis: Secondary | ICD-10-CM

## 2021-09-28 LAB — MRSA NEXT GEN BY PCR, NASAL: MRSA by PCR Next Gen: DETECTED — AB

## 2021-09-28 LAB — CBC WITH DIFFERENTIAL/PLATELET
Abs Immature Granulocytes: 0.1 10*3/uL — ABNORMAL HIGH (ref 0.00–0.07)
Basophils Absolute: 0.1 10*3/uL (ref 0.0–0.1)
Basophils Relative: 1 %
Eosinophils Absolute: 0.4 10*3/uL (ref 0.0–0.5)
Eosinophils Relative: 3 %
HCT: 35.4 % — ABNORMAL LOW (ref 39.0–52.0)
Hemoglobin: 11.3 g/dL — ABNORMAL LOW (ref 13.0–17.0)
Immature Granulocytes: 1 %
Lymphocytes Relative: 11 %
Lymphs Abs: 1.5 10*3/uL (ref 0.7–4.0)
MCH: 29.7 pg (ref 26.0–34.0)
MCHC: 31.9 g/dL (ref 30.0–36.0)
MCV: 92.9 fL (ref 80.0–100.0)
Monocytes Absolute: 0.9 10*3/uL (ref 0.1–1.0)
Monocytes Relative: 7 %
Neutro Abs: 10.6 10*3/uL — ABNORMAL HIGH (ref 1.7–7.7)
Neutrophils Relative %: 77 %
Platelets: 262 10*3/uL (ref 150–400)
RBC: 3.81 MIL/uL — ABNORMAL LOW (ref 4.22–5.81)
RDW: 17 % — ABNORMAL HIGH (ref 11.5–15.5)
WBC: 13.6 10*3/uL — ABNORMAL HIGH (ref 4.0–10.5)
nRBC: 0 % (ref 0.0–0.2)

## 2021-09-28 LAB — BASIC METABOLIC PANEL
Anion gap: 15 (ref 5–15)
BUN: 66 mg/dL — ABNORMAL HIGH (ref 8–23)
CO2: 23 mmol/L (ref 22–32)
Calcium: 9 mg/dL (ref 8.9–10.3)
Chloride: 100 mmol/L (ref 98–111)
Creatinine, Ser: 5.92 mg/dL — ABNORMAL HIGH (ref 0.61–1.24)
GFR, Estimated: 9 mL/min — ABNORMAL LOW (ref >60)
Glucose, Bld: 93 mg/dL (ref 70–99)
Potassium: 5.5 mmol/L — ABNORMAL HIGH (ref 3.5–5.1)
Sodium: 138 mmol/L (ref 135–145)

## 2021-09-28 LAB — MAGNESIUM: Magnesium: 1.5 mg/dL — ABNORMAL LOW (ref 1.7–2.4)

## 2021-09-28 LAB — POTASSIUM: Potassium: 5.6 mmol/L — ABNORMAL HIGH (ref 3.5–5.1)

## 2021-09-28 LAB — HEPATITIS B SURFACE ANTIGEN: Hepatitis B Surface Ag: NONREACTIVE

## 2021-09-28 MED ORDER — SODIUM CHLORIDE 0.9 % IV SOLN
1.0000 g | Freq: Once | INTRAVENOUS | Status: AC
  Administered 2021-09-28: 1 g via INTRAVENOUS
  Filled 2021-09-28: qty 10

## 2021-09-28 MED ORDER — ASPIRIN 81 MG PO TBEC
81.0000 mg | DELAYED_RELEASE_TABLET | Freq: Every day | ORAL | Status: DC
  Administered 2021-09-29 – 2021-09-30 (×2): 81 mg via ORAL
  Filled 2021-09-28 (×2): qty 1

## 2021-09-28 MED ORDER — ACETAMINOPHEN 650 MG RE SUPP
650.0000 mg | Freq: Four times a day (QID) | RECTAL | Status: DC | PRN
Start: 2021-09-28 — End: 2021-09-29

## 2021-09-28 MED ORDER — LANTHANUM CARBONATE 500 MG PO CHEW
1000.0000 mg | CHEWABLE_TABLET | Freq: Three times a day (TID) | ORAL | Status: DC
  Administered 2021-09-29 – 2021-09-30 (×5): 1000 mg via ORAL
  Filled 2021-09-28 (×5): qty 2

## 2021-09-28 MED ORDER — LEVOTHYROXINE SODIUM 50 MCG PO TABS
50.0000 ug | ORAL_TABLET | Freq: Every day | ORAL | Status: DC
Start: 2021-09-29 — End: 2021-10-01
  Administered 2021-09-29 – 2021-09-30 (×2): 50 ug via ORAL
  Filled 2021-09-28 (×2): qty 1

## 2021-09-28 MED ORDER — SODIUM CHLORIDE 0.9 % IV SOLN
500.0000 mg | INTRAVENOUS | Status: DC
  Administered 2021-09-29: 500 mg via INTRAVENOUS
  Filled 2021-09-28 (×2): qty 5

## 2021-09-28 MED ORDER — HYDROXYZINE HCL 10 MG PO TABS
10.0000 mg | ORAL_TABLET | Freq: Three times a day (TID) | ORAL | Status: DC | PRN
  Administered 2021-09-29 – 2021-09-30 (×4): 10 mg via ORAL
  Filled 2021-09-28 (×5): qty 1

## 2021-09-28 MED ORDER — HYDRALAZINE HCL 10 MG PO TABS: 10.0000 mg | ORAL_TABLET | Freq: Three times a day (TID) | ORAL | Status: DC | PRN

## 2021-09-28 MED ORDER — CEFTRIAXONE SODIUM 1 G IJ SOLR
1.0000 g | INTRAMUSCULAR | Status: DC
Start: 2021-09-29 — End: 2021-09-30
  Administered 2021-09-29: 1 g via INTRAVENOUS
  Filled 2021-09-28: qty 10

## 2021-09-28 MED ORDER — SODIUM CHLORIDE 0.9 % IV BOLUS
250.0000 mL | Freq: Once | INTRAVENOUS | Status: AC
Start: 2021-09-28 — End: 2021-09-28
  Administered 2021-09-28: 250 mL via INTRAVENOUS

## 2021-09-28 MED ORDER — IPRATROPIUM-ALBUTEROL 0.5-2.5 (3) MG/3ML IN SOLN
3.0000 mL | Freq: Four times a day (QID) | RESPIRATORY_TRACT | Status: DC
Start: 2021-09-28 — End: 2021-09-29
  Administered 2021-09-28 – 2021-09-29 (×2): 3 mL via RESPIRATORY_TRACT
  Filled 2021-09-28 (×2): qty 3

## 2021-09-28 MED ORDER — SODIUM CHLORIDE 0.9% FLUSH
3.0000 mL | Freq: Two times a day (BID) | INTRAVENOUS | Status: DC
  Administered 2021-09-28 – 2021-09-30 (×4): 3 mL via INTRAVENOUS

## 2021-09-28 MED ORDER — ONDANSETRON HCL 4 MG PO TABS: 4.0000 mg | ORAL_TABLET | Freq: Four times a day (QID) | ORAL | Status: DC | PRN

## 2021-09-28 MED ORDER — OXYCODONE HCL 5 MG PO TABS
5.0000 mg | ORAL_TABLET | ORAL | Status: DC | PRN
  Administered 2021-09-29 – 2021-09-30 (×4): 5 mg via ORAL
  Filled 2021-09-28 (×4): qty 1

## 2021-09-28 MED ORDER — OXYCODONE HCL 5 MG PO TABS
5.0000 mg | ORAL_TABLET | ORAL | Status: DC | PRN
Start: 2021-09-28 — End: 2021-09-28

## 2021-09-28 MED ORDER — ALBUTEROL SULFATE (2.5 MG/3ML) 0.083% IN NEBU: 2.5000 mg | INHALATION_SOLUTION | Freq: Four times a day (QID) | RESPIRATORY_TRACT | Status: DC | PRN

## 2021-09-28 MED ORDER — TAMSULOSIN HCL 0.4 MG PO CAPS
0.4000 mg | ORAL_CAPSULE | Freq: Every day | ORAL | Status: DC
  Administered 2021-09-29 – 2021-09-30 (×2): 0.4 mg via ORAL
  Filled 2021-09-28 (×2): qty 1

## 2021-09-28 MED ORDER — ACETAMINOPHEN 325 MG PO TABS: 650.0000 mg | ORAL_TABLET | Freq: Four times a day (QID) | ORAL | Status: DC | PRN

## 2021-09-28 MED ORDER — ACETAMINOPHEN 325 MG PO TABS
650.0000 mg | ORAL_TABLET | Freq: Once | ORAL | Status: AC
  Administered 2021-09-28: 650 mg via ORAL
  Filled 2021-09-28: qty 2

## 2021-09-28 MED ORDER — SODIUM ZIRCONIUM CYCLOSILICATE 10 G PO PACK
10.0000 g | PACK | Freq: Once | ORAL | Status: AC
  Administered 2021-09-28: 10 g via ORAL
  Filled 2021-09-28: qty 1

## 2021-09-28 MED ORDER — METOPROLOL TARTRATE 5 MG/5ML IV SOLN
2.5000 mg | Freq: Once | INTRAVENOUS | Status: AC
  Administered 2021-09-28: 2.5 mg via INTRAVENOUS
  Filled 2021-09-28: qty 5

## 2021-09-28 MED ORDER — SODIUM CHLORIDE 0.9 % IV SOLN
500.0000 mg | Freq: Once | INTRAVENOUS | Status: AC
  Administered 2021-09-28: 500 mg via INTRAVENOUS
  Filled 2021-09-28: qty 5

## 2021-09-28 MED ORDER — HEPARIN SODIUM (PORCINE) 5000 UNIT/ML IJ SOLN
5000.0000 [IU] | Freq: Three times a day (TID) | INTRAMUSCULAR | Status: DC
  Administered 2021-09-28 – 2021-09-30 (×6): 5000 [IU] via SUBCUTANEOUS
  Filled 2021-09-28 (×6): qty 1

## 2021-09-28 MED ORDER — OXYCODONE HCL 5 MG PO TABS
5.0000 mg | ORAL_TABLET | Freq: Once | ORAL | Status: AC
  Administered 2021-09-28: 5 mg via ORAL
  Filled 2021-09-28: qty 1

## 2021-09-28 MED ORDER — PRAVASTATIN SODIUM 40 MG PO TABS
40.0000 mg | ORAL_TABLET | Freq: Every day | ORAL | Status: DC
  Administered 2021-09-28 – 2021-09-29 (×2): 40 mg via ORAL
  Filled 2021-09-28 (×2): qty 1

## 2021-09-28 MED ORDER — CHLORHEXIDINE GLUCONATE CLOTH 2 % EX PADS
6.0000 | MEDICATED_PAD | Freq: Every day | CUTANEOUS | Status: DC
  Administered 2021-09-29 – 2021-09-30 (×2): 6 via TOPICAL

## 2021-09-28 MED ORDER — MELATONIN 5 MG PO TABS
5.0000 mg | ORAL_TABLET | Freq: Every day | ORAL | Status: DC
  Administered 2021-09-28 – 2021-09-29 (×2): 5 mg via ORAL
  Filled 2021-09-28 (×2): qty 1

## 2021-09-28 MED ORDER — ONDANSETRON HCL 4 MG/2ML IJ SOLN: 4.0000 mg | Freq: Four times a day (QID) | INTRAMUSCULAR | Status: DC | PRN

## 2021-09-28 MED ORDER — SODIUM ZIRCONIUM CYCLOSILICATE 5 G PO PACK
5.0000 g | PACK | Freq: Once | ORAL | Status: DC
  Filled 2021-09-28: qty 1

## 2021-09-28 MED ORDER — DILTIAZEM HCL ER COATED BEADS 180 MG PO CP24
180.0000 mg | ORAL_CAPSULE | Freq: Every day | ORAL | Status: DC
  Administered 2021-09-28: 180 mg via ORAL
  Filled 2021-09-28 (×2): qty 1

## 2021-09-28 NOTE — Consult Note (Signed)
Cardiology Consultation:   Patient ID: EGBERT SEIDEL MRN: [id number]; DOB: 23-Mar-1934  Admit date: 09/28/2021 Date of Consult: 09/28/2021  Primary Care Provider: [facility], [name] Primary Cardiologist: Sherren Mocha, MD  Primary Electrophysiologist:  None    Patient Profile:   Wayne Meadows is a 87 y.o. male with a hx of ESRD, CAD, carotid disease, history of CVA, hypertension, paroxysmal atrial fibrillation who is being seen today for the evaluation of shortness of breath and tachycardia at the request of emergency department.  History of Present Illness:   Mr. Wages is an 87 year old gentleman with a history of ESRD, CAD, carotid disease, history of CVA, hypertension, paroxysmal atrial fibrillation who initially presented to the emergency department after being brought in by his skilled nursing facility for the evaluation of tachycardiac and SOB.  When EMS first arrived, they treated him for respiratory distress with albuterol and Atrovent.  He has been on baseline oxygenation for several months and actually notes that he felt better yesterday and was off O2 some.  He has been tolerating dialysis and attending sessions regularly.  He has recently been admitted for upper extremity fistula issues and a closed fracture of his hip.  No recent changes in his cardiovascular medications.  On arrival, he was started back on diltiazem that was not administered yet today. He was in AFL with HR 130s. He has since convered to NSR and is asymptomatic and back to prior baseline.   Past Medical History:  Diagnosis Date   Abnormal echocardiogram 03/08/2021   Abnormal finding on GI tract imaging    Acute blood loss anemia    Acute pancreatitis 08/12/2020   Arthritis    Carotid stenosis    Community acquired pneumonia 04/06/2021   COPD (chronic obstructive pulmonary disease) (HCC)    Coronary artery disease    S/p PCI 2011;  NSTEMI 12/12:  LHC/PCI 02/23/11: LAD 60% after the septal  perforator, D1 occluded with distal collaterals, proximal RI 30-40%, AV circumflex stent patent with 60% stenosis after the stent, RCA 99%, EF 60-65%.  His RCA was treated with a bare-metal stent   CVA (cerebral infarction) 2011   Right cerebral; total obstruction of the right ICA   Diverticulitis    Hypertension    Paroxysmal atrial fibrillation with RVR ([place]) 04/09/2021   Pleuritic chest pain 04/08/2014   Pressure injury of skin 03/30/2021   Rash 03/05/2021   Rectal bleeding 07/2015   Refractory nausea and vomiting 06/20/2020   Renal carcinoma ([place])    Sepsis, unspecified organism ([place]) 04/11/2016   Stroke ([place])    Tobacco abuse, in remission     Past Surgical History:  Procedure Laterality Date   AV FISTULA PLACEMENT Right 04/03/2021   Procedure: ARTERIOVENOUS (AV) CREATION OF RIGHT ARM BRACHIOCEPHALIC FISTULA;  Surgeon: Cherre Robins, MD;  Location: [facility];  Service: Vascular;  Laterality: Right;   BIOPSY  04/08/2021   Procedure: BIOPSY;  Surgeon: Lavena Bullion, DO;  Location: [place];  Service: Gastroenterology;;   COLON SURGERY     COLONOSCOPY N/A 08/22/2015   Procedure: COLONOSCOPY;  Surgeon: [name], MD;  Location: [facility];  Service: Endoscopy;  Laterality: N/A;   ESOPHAGOGASTRODUODENOSCOPY N/A 07/18/2020   Procedure: ESOPHAGOGASTRODUODENOSCOPY (EGD);  Surgeon: Milus Banister, MD;  Location: [facility];  Service: Endoscopy;  Laterality: N/A;   ESOPHAGOGASTRODUODENOSCOPY (EGD) WITH PROPOFOL N/A 06/21/2020   Procedure: ESOPHAGOGASTRODUODENOSCOPY (EGD) WITH PROPOFOL;  Surgeon: Irene Shipper, MD;  Location: [facility] ENDOSCOPY;  Service: Endoscopy;  Laterality: N/A;   ESOPHAGOGASTRODUODENOSCOPY (EGD) WITH PROPOFOL N/A 04/08/2021   Procedure: ESOPHAGOGASTRODUODENOSCOPY (EGD) WITH PROPOFOL;  Surgeon: Lavena Bullion, DO;  Location: [place];  Service: Gastroenterology;  Laterality: N/A;   EUS N/A 07/18/2020   Procedure: UPPER ENDOSCOPIC ULTRASOUND (EUS) RADIAL;   Surgeon: Milus Banister, MD;  Location: [facility] ENDOSCOPY;  Service: Endoscopy;  Laterality: N/A;   FINE NEEDLE ASPIRATION N/A 07/18/2020   Procedure: FINE NEEDLE ASPIRATION (FNA) LINEAR;  Surgeon: Milus Banister, MD;  Location: [facility] ENDOSCOPY;  Service: Endoscopy;  Laterality: N/A;   HEMOSTASIS CLIP PLACEMENT  04/08/2021   Procedure: HEMOSTASIS CLIP PLACEMENT;  Surgeon: Lavena Bullion, DO;  Location: [facility];  Service: Gastroenterology;;   hip relacement     INCISION AND DRAINAGE Right 09/11/2021   Procedure: INCISION AND DRAINAGE OF RIGHT ARM FISTULA;  Surgeon: Cherre Robins, MD;  Location: [facility] OR;  Service: Vascular;  Laterality: Right;   IR FLUORO GUIDE CV LINE RIGHT  03/31/2021   IR REMOVAL TUN CV CATH W/O FL  08/15/2021   IR US GUIDE VASC ACCESS RIGHT  03/31/2021   KIDNEY SURGERY     LEFT HEART CATH AND CORONARY ANGIOGRAPHY N/A 07/09/2020   Procedure: LEFT HEART CATH AND CORONARY ANGIOGRAPHY;  Surgeon: Leonie Man, MD;  Location: [facility] CV [facility];  Service: Cardiovascular;  Laterality: N/A;   LEFT HEART CATHETERIZATION WITH CORONARY ANGIOGRAM N/A 02/23/2011   Procedure: LEFT HEART CATHETERIZATION WITH CORONARY ANGIOGRAM;  Surgeon: [name], MD;  Location: [facility] CATH LAB;  Service: Cardiovascular;  Laterality: N/A;   LEFT HEART CATHETERIZATION WITH CORONARY ANGIOGRAM N/A 03/18/2011   Procedure: LEFT HEART CATHETERIZATION WITH CORONARY ANGIOGRAM;  Surgeon: [name], MD;  Location: [facility] CATH LAB;  Service: Cardiovascular;  Laterality: N/A;   PERCUTANEOUS CORONARY STENT INTERVENTION (PCI-S) N/A 02/23/2011   Procedure: PERCUTANEOUS CORONARY STENT INTERVENTION (PCI-S);  Surgeon: [name], MD;  Location: [facility] CATH LAB;  Service: Cardiovascular;  Laterality: N/A;   TEMPORARY PACEMAKER INSERTION N/A 02/23/2011   Procedure: TEMPORARY PACEMAKER INSERTION;  Surgeon: [name], MD;  Location: [facility] CATH LAB;  Service: Cardiovascular;  Laterality: N/A;   THROMBECTOMY W/ EMBOLECTOMY  Right 04/03/2021   Procedure: THROMBECTOMY ARTERIOVENOUS FISTULA;  Surgeon: Cherre Robins, MD;  Location: [facility] OR;  Service: Vascular;  Laterality: Right;     Home Medications:  Prior to Admission medications   Medication Sig Start Date End Date Taking? Authorizing Provider  acetaminophen (TYLENOL) 325 MG tablet Take 2 tablets (650 mg total) by mouth every 6 (six) hours as needed for mild pain (or Fever >/= 101). 04/10/21   Bonnielee Haff, MD  albuterol (VENTOLIN HFA) 108 (90 Base) MCG/ACT inhaler Inhale 2 puffs into the lungs every 6 (six) hours as needed for shortness of breath.    [provider]  Amino Acids-Protein Hydrolys (FEEDING SUPPLEMENT, PRO-STAT SUGAR FREE 64,) LIQD Take 30 mLs by mouth every evening. (1700)    [provider]  aspirin EC 81 MG tablet Take 81 mg by mouth daily.  (0900)    [provider]  bisacodyl (DULCOLAX) 10 MG suppository Place 10 mg rectally daily as needed for moderate constipation.    [provider]  busPIRone (BUSPAR) 5 MG tablet Take 5 mg by mouth 2 (two) times daily. (0800 & 2000)    [provider]  camphor-menthol (SARNA) lotion Apply topically as needed for itching. Patient taking differently: Apply 1 Application topically as needed for itching. 04/23/21  Alma Friendly, MD  cloNIDine (CATAPRES) 0.1 MG tablet Take 1 tablet (0.1 mg total) by mouth daily. 06/24/20   Harvie Heck, MD  diltiazem (CARDIZEM CD) 180 MG 24 hr capsule Take 1 capsule (180 mg total) by mouth daily. 04/11/21   Bonnielee Haff, MD  docusate sodium (COLACE) 100 MG capsule Take 1 capsule (100 mg total) by mouth every 12 (twelve) hours. 05/01/21   Sherwood Gambler, MD  ferrous sulfate 325 (65 FE) MG tablet Take 1 tablet by mouth every Monday, Wednesday, and Friday. (0900) 07/30/20   [provider]  guaiFENesin 300 MG/15ML LIQD Take 600 mg by mouth every 12 (twelve) hours. (0900 & 2100)    [provider]  hydrALAZINE  (APRESOLINE) 10 MG tablet Take 1 tablet (10 mg total) by mouth every 8 (eight) hours as needed (for SBP greater than 180). 04/04/21   Little Ishikawa, MD  HYDROcodone-acetaminophen (NORCO/VICODIN) 5-325 MG tablet Take 1 tablet by mouth every 4 (four) hours as needed for severe pain. 09/17/21   Azucena Cecil, PA-C  hydrOXYzine (ATARAX) 10 MG tablet Take 10 mg by mouth 3 (three) times daily as needed for itching.    [provider]  Ipratropium-Albuterol (COMBIVENT) 20-100 MCG/ACT AERS respimat Inhale 1 puff into the lungs 4 (four) times daily. (0900, 1300, 1700 & 2100)    [provider]  ipratropium-albuterol (DUONEB) 0.5-2.5 (3) MG/3ML SOLN Take 3 mLs by nebulization every 4 (four) hours as needed (shortness of breath/wheezing).    [provider]  lanthanum (FOSRENOL) 1000 MG chewable tablet Chew 1 tablet (1,000 mg total) by mouth 3 (three) times daily with meals. 04/04/21   Little Ishikawa, MD  levothyroxine (SYNTHROID) 50 MCG tablet Take 50 mcg by mouth daily before breakfast. (0630)    [provider]  melatonin 5 MG TABS Take 5 mg by mouth at bedtime. (2100)    [provider]  Menthol, Topical Analgesic, (BIOFREEZE) 4 % GEL Apply 1 Application topically 3 (three) times daily as needed (pain).    [provider]  Multiple Vitamin (MULTIVITAMIN WITH MINERALS) TABS tablet Take 1 tablet by mouth daily. (0900)    [provider]  Multiple Vitamins-Minerals (DECUBI-VITE PO) Take 1 tablet by mouth in the morning. (0900)    [provider]  naphazoline-glycerin (CLEAR EYES REDNESS) 0.012-0.25 % SOLN Place 1-2 drops into the right eye 3 (three) times daily. 04/23/21   Alma Friendly, MD  neomycin-bacitracin-polymyxin (NEOSPORIN) OINT Apply 1 application topically 3 (three) times daily. To right antecubital area 04/10/21   Bonnielee Haff, MD  Nutritional Supplements (,FEEDING SUPPLEMENT, PROSOURCE PLUS) liquid Take 30  mLs by mouth 2 (two) times daily between meals. Patient not taking: Reported on 09/10/2021 04/23/21   Alma Friendly, MD  Nutritional Supplements (FEEDING SUPPLEMENT, NEPRO CARB STEADY,) LIQD Take 237 mLs by mouth 3 (three) times daily before meals. Patient not taking: Reported on 09/10/2021 04/23/21   Alma Friendly, MD  Nutritional Supplements (NOVASOURCE RENAL) LIQD Take 237 mLs by mouth in the morning and at bedtime. (0800 & 1700)    [provider]  ondansetron (ZOFRAN) 4 MG tablet Take 1 tablet (4 mg total) by mouth daily as needed for nausea or vomiting. 04/11/21 04/11/22  Bonnielee Haff, MD  oxyCODONE (OXY IR/ROXICODONE) 5 MG immediate release tablet Take 1 tablet (5 mg total) by mouth every 4 (four) hours as needed (pain.). 09/12/21   Ulyses Amor, PA-C  pantoprazole (PROTONIX) 40 MG tablet  Take 1 tablet (40 mg total) by mouth 2 (two) times daily. 04/10/21   Bonnielee Haff, MD  polyethylene glycol (MIRALAX / GLYCOLAX) 17 g packet Take 17 g by mouth daily. 05/01/21   Sherwood Gambler, MD  pravastatin (PRAVACHOL) 40 MG tablet Take 40 mg by mouth at bedtime. (2100)    [provider]  prednisoLONE (ORAPRED ODT) 10 MG disintegrating tablet Take 10 mg by mouth See admin instructions. Take 1 tablet (10 mg) by mouth for 2 days    [provider]  predniSONE (DELTASONE) 10 MG tablet Take 20-30 mg by mouth See admin instructions. Take 3 tablets (30 mg) by mouth daily in the evening for 2 days, then decrease to take 2 tablets (20 mg) by mouth in the evening for 2 days then discontinue.    [provider]  predniSONE (DELTASONE) 20 MG tablet Take 20 mg by mouth See admin instructions. Take 2 tablets (40 mg) by mouth in the evening for 2 days.    [provider]  tamsulosin (FLOMAX) 0.4 MG CAPS capsule Take 1 capsule (0.4 mg total) by mouth daily. 04/11/21   Bonnielee Haff, MD    Inpatient Medications: Scheduled Meds:  diltiazem  180 mg Oral Daily    sodium zirconium cyclosilicate  5 g Oral Once   Continuous Infusions:  PRN Meds:   Allergies:    Allergies  Allergen Reactions   Bee Venom Anaphylaxis    Has epi pen   Influenza Vaccines Other (See Comments)    "Mortally sick for 2 weeks"    Social History:   Social History   Socioeconomic History   Marital status: Divorced    Spouse name: Not on file   Number of children: Not on file   Years of education: Not on file   Highest education level: Not on file  Occupational History   Not on file  Tobacco Use   Smoking status: Former    Types: Cigarettes    Quit date: 02/24/2007    Years since quitting: 14.6   Smokeless tobacco: Never  Vaping Use   Vaping Use: Never used  Substance and Sexual Activity   Alcohol use: Not Currently    Alcohol/week: 1.0 standard drink of alcohol    Types: 1 Standard drinks or equivalent per week    Comment: socially    Drug use: No   Sexual activity: Not on file  Other Topics Concern   Not on file  Social History Narrative   Not on file   Social Determinants of Health   Financial Resource Strain: Not on file  Food Insecurity: Not on file  Transportation Needs: Not on file  Physical Activity: Not on file  Stress: Not on file  Social Connections: Not on file  Intimate Partner Violence: Not on file    Family History:   Family History  Problem Relation Age of Onset   Heart attack Other 61    12 point ROS negative unless noted above in the HPI  Physical Exam/Data:   Vitals:   09/28/21 1730 09/28/21 1800 09/28/21 1815 09/28/21 1846  BP: 125/77 109/77 112/77   Pulse: 88 (!) 134 (!) 134   Resp: '19 19 15   '$ Temp:    98.1 F (36.7 C)  TempSrc:    Oral  SpO2: 96% 98% 98%   Weight:      Height:       No intake or output data in the 24 hours ending 09/28/21 1927 [employer]  09/28/21 1423  Weight: 77.1 kg   Body mass index is 21.83 kg/m.  General:   in no acute distress HEENT: normal Lymph: no adenopathy Neck: no  JVD Endocrine:  No thryomegaly Vascular: No carotid bruits; FA pulses 2+ bilaterally without bruits  Cardiac:  normal S1, S2; RRR; no murmur  Lungs:  coarse breath sounds throughout Abd: soft, nontender, no hepatomegaly  Ext: no edema Musculoskeletal:  No deformities, BUE and BLE strength normal and equal Skin: warm and dry  Neuro:  CNs 2-12 intact, no focal abnormalities noted Psych:  Normal affect   EKG:  The EKG was personally reviewed and demonstrates:  AFL with variable blcok Telemetry:  Telemetry was personally reviewed and demonstrates:  NSR now   Relevant CV Studies: Echo 02/2021: 1. Left ventricular ejection fraction, by estimation, is 65 to 70%. The  left ventricle has hyperdynamic function. The left ventricle has no  regional wall motion abnormalities. There is mild concentric left  ventricular hypertrophy. Left ventricular  diastolic parameters are consistent with Grade I diastolic dysfunction  (impaired relaxation).   2. Right ventricular systolic function is normal. The right ventricular  size is normal. There is mildly elevated pulmonary artery systolic  pressure.   3. Left atrial size was mildly dilated.   4. 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  can't rule out PFO.   5. Right atrial size was mildly dilated.   6. The mitral valve is normal in structure. No evidence of mitral valve  regurgitation.   7. The aortic valve was not well visualized. Aortic valve regurgitation  is mild to moderate.   8. The inferior vena cava is normal in size with greater than 50%  respiratory variability, suggesting right atrial pressure of 3 mmHg.   Laboratory Data:  Chemistry Recent Labs  Lab 09/28/21 1452  NA 138  K 5.5*  CL 100  CO2 23  GLUCOSE 93  BUN 66*  CREATININE 5.92*  CALCIUM 9.0  GFRNONAA 9*  ANIONGAP 15    No results for input(s): "PROT", "ALBUMIN", "AST", "ALT", "ALKPHOS", "BILITOT" in the last 168 hours. Hematology Recent  Labs  Lab 09/28/21 1452  WBC 13.6*  RBC 3.81*  HGB 11.3*  HCT 35.4*  MCV 92.9  MCH 29.7  MCHC 31.9  RDW 17.0*  PLT 262   Cardiac EnzymesNo results for input(s): "TROPONINI" in the last 168 hours. No results for input(s): "TROPIPOC" in the last 168 hours.  BNPNo results for input(s): "BNP", "PROBNP" in the last 168 hours.  DDimer No results for input(s): "DDIMER" in the last 168 hours.  Radiology/Studies:  DG Chest Port 1 View  Result Date: 09/28/2021 CLINICAL DATA:  Shortness of breath, bladder infection EXAM: PORTABLE CHEST 1 VIEW COMPARISON:  09/17/2021 FINDINGS: Single frontal view of the chest demonstrates an unremarkable cardiac silhouette. There is increased retrocardiac consolidation and veiling opacity at the right lung base. Left costophrenic angle is excluded by collimation. No pneumothorax. No acute bony abnormalities. IMPRESSION: 1. Increasing bibasilar opacities, consistent with worsening airspace disease and/or effusion. Electronically Signed   By: Randa Ngo M.D.   On: 09/28/2021 15:03    Assessment and Plan:   # Atrial Flutter with variable AV block.  Has history of paroxysmal atrial fibrillation but no documented atrial fibrillation for quite some time.  Was not on anticoagulation previously due to bleeding risk.  He has a CHA2DS2-VASc 2 score of 5 and a HAS-BLED of 4.  Given his predisposition  to bleeding, would hold off on anticoagulation for now.  He continues to go into atrial arrhythmias, then we will have to reconsider and have a discussion with him.  But given recent falls and rib and pelvis injuries, I heir on the side of holding anticoagulation for now.  Getting back on his diltiazem has reverted him to normal sinus rhythm.  Appreciate medicine working up any other triggers (I.e treating for CAP), but no clear triggers for this currently.  We will monitor him to see if there is any further intervention needed.  - continue diltiazem - no anticoagulation at this  time  - continue asa 81 mg      For questions or updates, please contact [place] Please consult [email or web address] for contact info under     Signed, Doyne Keel, MD  09/28/2021 7:27 PM

## 2021-09-28 NOTE — Assessment & Plan Note (Signed)
Continue Synthroid °

## 2021-09-28 NOTE — Assessment & Plan Note (Signed)
Continue aspirin and statin. 

## 2021-09-28 NOTE — Assessment & Plan Note (Signed)
History of post cath paroxysmal atrial fibrillation in 2012.  Not started on anticoagulation at that time due to known hemorrhagic cyst.  No known recurrence of A-fib since that time but in the ED was found to have new atrial flutter with variable block.  Has since converted to sinus rhythm.  Admitted earlier this year with upper GI bleed and also reports recent nosebleeds. -Cardiology to see, appreciate assistance -Will hold addition of full dose anticoagulation for now -Continue aspirin 81 mg daily -Continue home diltiazem 180 mg daily -Keep on telemetry -Check magnesium and TSH -Give Lokelma for hyperkalemia

## 2021-09-28 NOTE — Assessment & Plan Note (Signed)
Has some mild wheezing on exam.  Has not received his breathing treatment so far today. -Placed on scheduled DuoNebs with albuterol as needed

## 2021-09-28 NOTE — Assessment & Plan Note (Signed)
No emergent need for tonight.  Nephrology notified for routine HD needs tomorrow if remains in hospital.  Underwent recent I&D to his RUE AV fistula on 7/20, has 2 sutures in place.

## 2021-09-28 NOTE — ED Notes (Signed)
Pharmacy sent a message about missing dose of PO Cardizem

## 2021-09-28 NOTE — Assessment & Plan Note (Signed)
S/p BMS to RCA in 2012 Stable, denies chest pain.  Continue aspirin and statin.

## 2021-09-28 NOTE — Assessment & Plan Note (Signed)
Potassium 5.5.  Give Lokelma 10 g once now.

## 2021-09-28 NOTE — ED Provider Notes (Addendum)
[facility] EMERGENCY DEPARTMENT Provider Note   CSN: [id number] Arrival date & time: 09/28/21  1410     History  Chief Complaint  Patient presents with   Shortness of Breath    Wayne Meadows is a 87 y.o. male.  HPI Patient presents for evaluation shortness of breath which started today.  He states he was doing better yesterday and did not even need his oxygen but today got worse so started using it again.  He called EMS because he did not begin to feel better.  They treated him with albuterol and Atrovent during transport.  He has been on oxygen for several months.  He has been dialyzing regularly.  He has been coughing and occasionally gets some mucus.  He denies fever, chills, chest pain, weakness or dizziness.  He is an ex-smoker.  He is currently at rehab.  He is taking medications for bladder infection currently.   Hospitalized 09/17/2021 for pelvic rami fractures.  Also had vascular procedure done 09/11/2021 for abscess of the right upper arm.    Home Medications Prior to Admission medications   Medication Sig Start Date End Date Taking? Authorizing Provider  acetaminophen (TYLENOL) 325 MG tablet Take 2 tablets (650 mg total) by mouth every 6 (six) hours as needed for mild pain (or Fever >/= 101). 04/10/21   Bonnielee Haff, MD  albuterol (VENTOLIN HFA) 108 (90 Base) MCG/ACT inhaler Inhale 2 puffs into the lungs every 6 (six) hours as needed for shortness of breath.    [provider]  Amino Acids-Protein Hydrolys (FEEDING SUPPLEMENT, PRO-STAT SUGAR FREE 64,) LIQD Take 30 mLs by mouth every evening. (1700)    [provider]  aspirin EC 81 MG tablet Take 81 mg by mouth daily.  (0900)    [provider]  bisacodyl (DULCOLAX) 10 MG suppository Place 10 mg rectally daily as needed for moderate constipation.    [provider]  busPIRone (BUSPAR) 5 MG tablet Take 5 mg by mouth 2 (two) times daily. (0800 & 2000)    [provider]  camphor-menthol (SARNA) lotion Apply topically as needed for itching. Patient taking differently: Apply 1 Application topically as needed for itching. 04/23/21   Alma Friendly, MD  cloNIDine (CATAPRES) 0.1 MG tablet Take 1 tablet (0.1 mg total) by mouth daily. 06/24/20   Harvie Heck, MD  diltiazem (CARDIZEM CD) 180 MG 24 hr capsule Take 1 capsule (180 mg total) by mouth daily. 04/11/21   Bonnielee Haff, MD  docusate sodium (COLACE) 100 MG capsule Take 1 capsule (100 mg total) by mouth every 12 (twelve) hours. 05/01/21   Sherwood Gambler, MD  ferrous sulfate 325 (65 FE) MG tablet Take 1 tablet by mouth every Monday, Wednesday, and Friday. (0900) 07/30/20   [provider]  guaiFENesin 300 MG/15ML LIQD Take 600 mg by mouth every 12 (twelve) hours. (0900 & 2100)    [provider]  hydrALAZINE (APRESOLINE) 10 MG tablet Take 1 tablet (10 mg total) by mouth every 8 (eight) hours as needed (for SBP greater than 180). 04/04/21   Little Ishikawa, MD  HYDROcodone-acetaminophen (NORCO/VICODIN) 5-325 MG tablet Take 1 tablet by mouth every 4 (four) hours as needed for severe pain. 09/17/21   Azucena Cecil, PA-C  hydrOXYzine (ATARAX) 10 MG tablet Take 10 mg by mouth 3 (three) times daily as needed for itching.    [provider]  Ipratropium-Albuterol (COMBIVENT) 20-100 MCG/ACT AERS respimat Inhale 1 puff into the  lungs 4 (four) times daily. (0900, 1300, 1700 & 2100)    [provider]  ipratropium-albuterol (DUONEB) 0.5-2.5 (3) MG/3ML SOLN Take 3 mLs by nebulization every 4 (four) hours as needed (shortness of breath/wheezing).    [provider]  lanthanum (FOSRENOL) 1000 MG chewable tablet Chew 1 tablet (1,000 mg total) by mouth 3 (three) times daily with meals. 04/04/21   Little Ishikawa, MD  levothyroxine (SYNTHROID) 50 MCG tablet Take 50 mcg by mouth daily before breakfast. (0630)    [provider]  melatonin 5 MG TABS Take 5  mg by mouth at bedtime. (2100)    [provider]  Menthol, Topical Analgesic, (BIOFREEZE) 4 % GEL Apply 1 Application topically 3 (three) times daily as needed (pain).    [provider]  Multiple Vitamin (MULTIVITAMIN WITH MINERALS) TABS tablet Take 1 tablet by mouth daily. (0900)    [provider]  Multiple Vitamins-Minerals (DECUBI-VITE PO) Take 1 tablet by mouth in the morning. (0900)    [provider]  naphazoline-glycerin (CLEAR EYES REDNESS) 0.012-0.25 % SOLN Place 1-2 drops into the right eye 3 (three) times daily. 04/23/21   Alma Friendly, MD  neomycin-bacitracin-polymyxin (NEOSPORIN) OINT Apply 1 application topically 3 (three) times daily. To right antecubital area 04/10/21   Bonnielee Haff, MD  Nutritional Supplements (,FEEDING SUPPLEMENT, PROSOURCE PLUS) liquid Take 30 mLs by mouth 2 (two) times daily between meals. Patient not taking: Reported on 09/10/2021 04/23/21   Alma Friendly, MD  Nutritional Supplements (FEEDING SUPPLEMENT, NEPRO CARB STEADY,) LIQD Take 237 mLs by mouth 3 (three) times daily before meals. Patient not taking: Reported on 09/10/2021 04/23/21   Alma Friendly, MD  Nutritional Supplements (NOVASOURCE RENAL) LIQD Take 237 mLs by mouth in the morning and at bedtime. (0800 & 1700)    [provider]  ondansetron (ZOFRAN) 4 MG tablet Take 1 tablet (4 mg total) by mouth daily as needed for nausea or vomiting. 04/11/21 04/11/22  Bonnielee Haff, MD  oxyCODONE (OXY IR/ROXICODONE) 5 MG immediate release tablet Take 1 tablet (5 mg total) by mouth every 4 (four) hours as needed (pain.). 09/12/21   Ulyses Amor, PA-C  pantoprazole (PROTONIX) 40 MG tablet Take 1 tablet (40 mg total) by mouth 2 (two) times daily. 04/10/21   Bonnielee Haff, MD  polyethylene glycol (MIRALAX / GLYCOLAX) 17 g packet Take 17 g by mouth daily. 05/01/21   Sherwood Gambler, MD  pravastatin (PRAVACHOL) 40 MG tablet Take 40 mg by mouth at bedtime.  (2100)    [provider]  prednisoLONE (ORAPRED ODT) 10 MG disintegrating tablet Take 10 mg by mouth See admin instructions. Take 1 tablet (10 mg) by mouth for 2 days    [provider]  predniSONE (DELTASONE) 10 MG tablet Take 20-30 mg by mouth See admin instructions. Take 3 tablets (30 mg) by mouth daily in the evening for 2 days, then decrease to take 2 tablets (20 mg) by mouth in the evening for 2 days then discontinue.    [provider]  predniSONE (DELTASONE) 20 MG tablet Take 20 mg by mouth See admin instructions. Take 2 tablets (40 mg) by mouth in the evening for 2 days.    [provider]  tamsulosin (FLOMAX) 0.4 MG CAPS capsule Take 1 capsule (0.4 mg total) by mouth daily. 04/11/21   Bonnielee Haff, MD      Allergies    Bee venom and Influenza vaccines    Review of Systems  Review of Systems  Physical Exam Updated Vital Signs BP 112/77   Pulse (!) 134   Temp 98.2 F (36.8 C) (Oral)   Resp 15   Ht '6\' 2"'$  (1.88 m)   Wt 77.1 kg   SpO2 98%   BMI 21.83 kg/m  Physical Exam Vitals and nursing note reviewed.  Constitutional:      General: He is not in acute distress.    Appearance: He is well-developed. He is not ill-appearing or diaphoretic.  HENT:     Head: Normocephalic and atraumatic.     Right Ear: External ear normal.     Left Ear: External ear normal.  Eyes:     Conjunctiva/sclera: Conjunctivae normal.     Pupils: Pupils are equal, round, and reactive to light.  Neck:     Trachea: Phonation normal.  Cardiovascular:     Rate and Rhythm: Normal rate and regular rhythm.     Heart sounds: Normal heart sounds.     Comments: Vascular fistula right upper arm Pulmonary:     Effort: Pulmonary effort is normal. No respiratory distress.     Breath sounds: No stridor. Rhonchi present.  Abdominal:     Palpations: Abdomen is soft.     Tenderness: There is no abdominal tenderness.  Musculoskeletal:        General: Normal range of  motion.     Cervical back: Normal range of motion and neck supple.  Skin:    General: Skin is warm and dry.  Neurological:     Mental Status: He is alert and oriented to person, place, and time.     Cranial Nerves: No cranial nerve deficit.     Sensory: No sensory deficit.     Motor: No abnormal muscle tone.     Coordination: Coordination normal.  Psychiatric:        Mood and Affect: Mood normal.        Behavior: Behavior normal.        Thought Content: Thought content normal.        Judgment: Judgment normal.     ED Results / Procedures / Treatments   Labs (all labs ordered are listed, but only abnormal results are displayed) Labs Reviewed  BASIC METABOLIC PANEL - Abnormal; Notable for the following components:      Result Value   Potassium 5.5 (*)    BUN 66 (*)    Creatinine, Ser 5.92 (*)    GFR, Estimated 9 (*)    All other components within normal limits  CBC WITH DIFFERENTIAL/PLATELET - Abnormal; Notable for the following components:   WBC 13.6 (*)    RBC 3.81 (*)    Hemoglobin 11.3 (*)    HCT 35.4 (*)    RDW 17.0 (*)    Neutro Abs 10.6 (*)    Abs Immature Granulocytes 0.10 (*)    All other components within normal limits    EKG EKG Interpretation  Date/Time:  Sunday September 28 2021 14:21:45 EDT Ventricular Rate:  142 PR Interval:  52 QRS Duration: 95 QT Interval:  312 QTC Calculation: 480 R Axis:   107 Text Interpretation: Supraventricular tachycardia Anterior infarct, old Nonspecific T abnormalities, inferior leads Since last tracing rate faster Confirmed by Daleen Bo [phone]) on 09/28/2021 3:18:34 PM  Radiology DG Chest Port 1 View  Result Date: 09/28/2021 CLINICAL DATA:  Shortness of breath, bladder infection EXAM: PORTABLE CHEST 1 VIEW COMPARISON:  09/17/2021 FINDINGS: Single frontal view of the chest demonstrates an unremarkable cardiac  silhouette. There is increased retrocardiac consolidation and veiling opacity at the right lung base. Left  costophrenic angle is excluded by collimation. No pneumothorax. No acute bony abnormalities. IMPRESSION: 1. Increasing bibasilar opacities, consistent with worsening airspace disease and/or effusion. Electronically Signed   By: Randa Ngo M.D.   On: 09/28/2021 15:03    Procedures Procedures    Medications Ordered in ED Medications  sodium zirconium cyclosilicate (LOKELMA) packet 5 g (5 g Oral Not Given 09/28/21 1656)  diltiazem (CARDIZEM CD) 24 hr capsule 180 mg (has no administration in time range)  sodium chloride 0.9 % bolus 250 mL (0 mLs Intravenous Stopped 09/28/21 1557)  metoprolol tartrate (LOPRESSOR) injection 2.5 mg (2.5 mg Intravenous Given 09/28/21 1656)  cefTRIAXone (ROCEPHIN) 1 g in sodium chloride 0.9 % 100 mL IVPB (0 g Intravenous Stopped 09/28/21 1740)  azithromycin (ZITHROMAX) 500 mg in sodium chloride 0.9 % 250 mL IVPB (0 mg Intravenous Stopped 09/28/21 1818)  acetaminophen (TYLENOL) tablet 650 mg (650 mg Oral Given 09/28/21 1728)  oxyCODONE (Oxy IR/ROXICODONE) immediate release tablet 5 mg (5 mg Oral Given 09/28/21 1821)    ED Course/ Medical Decision Making/ A&P Clinical Course as of 09/28/21 1844  Sun Sep 28, 2021  1705 Patient was given Lopressor 2.5 mg with decrease of heart rate to 100.  EKG done at this time indicates atrial Fraller with variable block. [EW]  1735 Case discussed with cardiologist, Dr. Marcelle Smiling  will see as a consult [EW]    Clinical Course User Index [EW] Daleen Bo, MD                           Medical Decision Making Patient presenting for shortness of breath, and cough.  Found to have tachycardia likely atrial flutter, this is new.  Patient required evaluation for complications of end-stage renal disease and cardiovascular sufficiency  Problems Addressed: Community acquired pneumonia, unspecified laterality: undiagnosed new problem with uncertain prognosis New onset atrial flutter ([place]): acute illness or injury that poses a threat to life or  bodily functions  Amount and/or Complexity of Data Reviewed Independent Historian:     Details: He is a cogent historian Labs: ordered.    Details: CBC, metabolic panel-potassium 5.5 high, BUN high, creatinine high, GFR low, white count high, hemoglobin low Radiology: ordered and independent interpretation performed.    Details: Chest x-ray-consistent with right-sided pneumonia, possible right-sided effusion/mild fluid overload ECG/medicine tests: ordered and independent interpretation performed.    Details: Cardiac monitor-atrial flutter Discussion of management or test interpretation with external provider(s): Case discussed with cardiologist, Dr. Marjo Bicker will see as a consult  Case discussed with hospitalist will admit the patient  Risk OTC drugs. Prescription drug management. Decision regarding hospitalization. Risk Details: Patient presenting for shortness of breath, found to have likely infectious process/community-acquired pneumonia.  Doubt fluid overload.  He dialyzed 2 days ago.  He initially had elevated heart rate at 142 which improved after some IV fluids and Lopressor.  Heart rate trended back up and oral Cardizem was ordered.  Case was discussed with cardiologist who will see as a consultant for new onset atrial flutter.  Hospitalist contacted to admit for management of pneumonia and arrange for dialysis as needed.  Critical Care Total time providing critical care: 45 minutes           Final Clinical Impression(s) / ED Diagnoses Final diagnoses:  Community acquired pneumonia, unspecified laterality  New onset atrial flutter ([place])    Rx /  DC Orders ED Discharge Orders     None         Daleen Bo, MD 09/28/21 Earley Favor    Daleen Bo, MD 09/28/21 2045

## 2021-09-28 NOTE — Assessment & Plan Note (Signed)
Currently normotensive.  Continue home diltiazem 180 mg daily.  Hold clonidine due to transient hypotension earlier in the ED.

## 2021-09-28 NOTE — Hospital Course (Signed)
Wayne Meadows is a 87 y.o. male with medical history significant for ESRD on MWF HD, CAD s/p BMS to RCA in 2012, COPD, chronic respiratory failure with hypoxia on 3 L O2 via [place], paroxysmal atrial fibrillation on anticoagulation (history of hemorrhagic renal cyst and upper GI bleed), history of CVA, hypothyroidism, HTN, HLD, anemia of CKD who is admitted with paroxysmal atrial flutter.

## 2021-09-28 NOTE — ED Triage Notes (Signed)
Pt BIB [facility] from [facility] facility due to [facility] and increased respiratory effort.  Pt is currently being treated for bladder infection.  A-fib 140; hx of the same.  Hx of COPD and Emphysema.  Pt gets dialysis on M,W, F.  Pt received 1 duoneb tx en route due to wheezing.  20g left hand.  VS BP 140/70, CBG 152, SpO2 93% on 2L of oxygen.

## 2021-09-28 NOTE — Assessment & Plan Note (Signed)
Stable on home 2-3 L O2 via [place].

## 2021-09-28 NOTE — Assessment & Plan Note (Signed)
Increasing bibasilar opacities seen on CXR.  Concern is for developing pneumonia.  Has mild leukocytosis but otherwise afebrile.  SPO2 stable on home 2-3 L O2 via [place]. -Continue empiric IV CTX and azithromycin for now, deescalate as able

## 2021-09-28 NOTE — H&P (Signed)
History and Physical    Wayne Meadows VFI:[id number] DOB: [birth year] DOA: 09/28/2021  PCP: [name]  Patient coming from: [name]  I have personally briefly reviewed patient's old medical records in [place]  Chief Complaint: Shortness of breath  HPI: Wayne Meadows is a 87 y.o. male with medical history significant for ESRD on MWF HD, CAD s/p BMS to RCA in 2012, COPD, chronic respiratory failure with hypoxia on 3 L O2 via [place], paroxysmal atrial fibrillation on anticoagulation (history of hemorrhagic renal cyst and upper GI bleed), history of CVA, hypothyroidism, HTN, HLD, anemia of CKD who presented to the ED from SNF for evaluation of shortness of breath.  Patient states that this morning he woke up feeling short of breath with palpitations.  He uses pulse ox and saw that his heart rate was in the 140s.  He says he was able to see the rapid pulse from vasculature of his RUE aVF.  He reports pleuritic chest discomfort with deep inspiration.  He reports a chronic cough related to his COPD usually productive of clear sputum in the morning.  He says he still makes a small amount of urine.  He has not seen any peripheral edema.  He completed his last routine HD session on 8/4 and is next due on 8/7.  He denies any subjective fevers, chills, diaphoresis, nausea, vomiting, abdominal pain.  ED Course  Labs/Imaging on admission: I have personally reviewed following labs and imaging studies.  Initial vitals showed BP 116/70, pulse 142, RR 15, temp 90.2 F, SPO2 98% on 2 L O2 via [place].  Labs show WBC 13.6, hemoglobin 11.3, platelets 262,000, sodium 138, potassium 5.5, bicarb 23, BUN 66, creatinine 5.92, serum glucose 93.  Portable chest x-ray shows increasing bibasilar opacities consistent with worsening airspace disease.  Initial EKG showed SVT with rate 140s.  Patient given to 50 cc normal saline and IV metoprolol 2.5 mg.  Rate improved to 100 on EKG with rhythm  showing atrial flutter with variable block.  Patient was given home dose oral diltiazem 180 mg.  Patient was started on IV ceftriaxone and azithromycin due to concern for pneumonia.  EDP discussed with on-call cardiology who will see in consultation.  The hospitalist service was consulted to admit for further evaluation and management.  Review of Systems: All systems reviewed and are negative except as documented in history of present illness above.   Past Medical History:  Diagnosis Date   Abnormal echocardiogram 03/08/2021   Abnormal finding on GI tract imaging    Acute blood loss anemia    Acute pancreatitis 08/12/2020   Arthritis    Carotid stenosis    Community acquired pneumonia 04/06/2021   COPD (chronic obstructive pulmonary disease) (HCC)    Coronary artery disease    S/p PCI 2011;  NSTEMI 12/12:  LHC/PCI 02/23/11: LAD 60% after the septal perforator, D1 occluded with distal collaterals, proximal RI 30-40%, AV circumflex stent patent with 60% stenosis after the stent, RCA 99%, EF 60-65%.  His RCA was treated with a bare-metal stent   CVA (cerebral infarction) 2011   Right cerebral; total obstruction of the right ICA   Diverticulitis    Hypertension    Paroxysmal atrial fibrillation with RVR ([place]) 04/09/2021   Pleuritic chest pain 04/08/2014   Pressure injury of skin 03/30/2021   Rash 03/05/2021   Rectal bleeding 07/2015   Refractory nausea and vomiting 06/20/2020   Renal carcinoma (HCC)    Sepsis, unspecified  organism ([place]) 04/11/2016   Stroke ([place])    Tobacco abuse, in remission     Past Surgical History:  Procedure Laterality Date   AV FISTULA PLACEMENT Right 04/03/2021   Procedure: ARTERIOVENOUS (AV) CREATION OF RIGHT ARM BRACHIOCEPHALIC FISTULA;  Surgeon: Cherre Robins, MD;  Location: [facility];  Service: Vascular;  Laterality: Right;   BIOPSY  04/08/2021   Procedure: BIOPSY;  Surgeon: Lavena Bullion, DO;  Location: [facility];  Service: Gastroenterology;;   COLON  SURGERY     COLONOSCOPY N/A 08/22/2015   Procedure: COLONOSCOPY;  Surgeon: [name], MD;  Location: [facility];  Service: Endoscopy;  Laterality: N/A;   ESOPHAGOGASTRODUODENOSCOPY N/A 07/18/2020   Procedure: ESOPHAGOGASTRODUODENOSCOPY (EGD);  Surgeon: Milus Banister, MD;  Location: [facility];  Service: Endoscopy;  Laterality: N/A;   ESOPHAGOGASTRODUODENOSCOPY (EGD) WITH PROPOFOL N/A 06/21/2020   Procedure: ESOPHAGOGASTRODUODENOSCOPY (EGD) WITH PROPOFOL;  Surgeon: Irene Shipper, MD;  Location: [facility];  Service: Endoscopy;  Laterality: N/A;   ESOPHAGOGASTRODUODENOSCOPY (EGD) WITH PROPOFOL N/A 04/08/2021   Procedure: ESOPHAGOGASTRODUODENOSCOPY (EGD) WITH PROPOFOL;  Surgeon: Lavena Bullion, DO;  Location: [facility];  Service: Gastroenterology;  Laterality: N/A;   EUS N/A 07/18/2020   Procedure: UPPER ENDOSCOPIC ULTRASOUND (EUS) RADIAL;  Surgeon: Milus Banister, MD;  Location: [facility];  Service: Endoscopy;  Laterality: N/A;   FINE NEEDLE ASPIRATION N/A 07/18/2020   Procedure: FINE NEEDLE ASPIRATION (FNA) LINEAR;  Surgeon: Milus Banister, MD;  Location: [facility];  Service: Endoscopy;  Laterality: N/A;   HEMOSTASIS CLIP PLACEMENT  04/08/2021   Procedure: HEMOSTASIS CLIP PLACEMENT;  Surgeon: Lavena Bullion, DO;  Location: [facility];  Service: Gastroenterology;;   hip relacement     INCISION AND DRAINAGE Right 09/11/2021   Procedure: INCISION AND DRAINAGE OF RIGHT ARM FISTULA;  Surgeon: Cherre Robins, MD;  Location: [facility];  Service: Vascular;  Laterality: Right;   IR FLUORO GUIDE CV LINE RIGHT  03/31/2021   IR REMOVAL TUN CV CATH W/O FL  08/15/2021   IR US GUIDE VASC ACCESS RIGHT  03/31/2021   KIDNEY SURGERY     LEFT HEART CATH AND CORONARY ANGIOGRAPHY N/A 07/09/2020   Procedure: LEFT HEART CATH AND CORONARY ANGIOGRAPHY;  Surgeon: Leonie Man, MD;  Location: [facility] CV LAB;  Service: Cardiovascular;  Laterality: N/A;   LEFT HEART CATHETERIZATION WITH  CORONARY ANGIOGRAM N/A 02/23/2011   Procedure: LEFT HEART CATHETERIZATION WITH CORONARY ANGIOGRAM;  Surgeon: [name], MD;  Location: [facility] CATH LAB;  Service: Cardiovascular;  Laterality: N/A;   LEFT HEART CATHETERIZATION WITH CORONARY ANGIOGRAM N/A 03/18/2011   Procedure: LEFT HEART CATHETERIZATION WITH CORONARY ANGIOGRAM;  Surgeon: [name], MD;  Location: [facility] CATH LAB;  Service: Cardiovascular;  Laterality: N/A;   PERCUTANEOUS CORONARY STENT INTERVENTION (PCI-S) N/A 02/23/2011   Procedure: PERCUTANEOUS CORONARY STENT INTERVENTION (PCI-S);  Surgeon: [name], MD;  Location: [facility] CATH LAB;  Service: Cardiovascular;  Laterality: N/A;   TEMPORARY PACEMAKER INSERTION N/A 02/23/2011   Procedure: TEMPORARY PACEMAKER INSERTION;  Surgeon: [name], MD;  Location[facility] CATH LAB;  Service: Cardiovascular;  Laterality: N/A;   THROMBECTOMY W/ EMBOLECTOMY Right 04/03/2021   Procedure: THROMBECTOMY ARTERIOVENOUS FISTULA;  Surgeon: Cherre Robins, MD;  Location: [facility];  Service: Vascular;  Laterality: Right;    Social History:  reports that he quit smoking about 14 years ago. His smoking use included cigarettes. He has never used smokeless tobacco. He reports that he does not currently use  alcohol after a past usage of about 1.0 standard drink of alcohol per week. He reports that he does not use drugs.  Allergies  Allergen Reactions   Bee Venom Anaphylaxis    Has epi pen   Influenza Vaccines Other (See Comments)    "Mortally sick for 2 weeks"    Family History  Problem Relation Age of Onset   Heart attack Other 2     Prior to Admission medications   Medication Sig Start Date End Date Taking? Authorizing Provider  acetaminophen (TYLENOL) 325 MG tablet Take 2 tablets (650 mg total) by mouth every 6 (six) hours as needed for mild pain (or Fever >/= 101). 04/10/21   Bonnielee Haff, MD  albuterol (VENTOLIN HFA) 108 (90 Base) MCG/ACT inhaler Inhale 2 puffs into the lungs every 6 (six)  hours as needed for shortness of breath.    [provider]  Amino Acids-Protein Hydrolys (FEEDING SUPPLEMENT, PRO-STAT SUGAR FREE 64,) LIQD Take 30 mLs by mouth every evening. (1700)    [provider]  aspirin EC 81 MG tablet Take 81 mg by mouth daily.  (0900)    [provider]  bisacodyl (DULCOLAX) 10 MG suppository Place 10 mg rectally daily as needed for moderate constipation.    [provider]  busPIRone (BUSPAR) 5 MG tablet Take 5 mg by mouth 2 (two) times daily. (0800 & 2000)    [provider]  camphor-menthol (SARNA) lotion Apply topically as needed for itching. Patient taking differently: Apply 1 Application topically as needed for itching. 04/23/21   Alma Friendly, MD  cloNIDine (CATAPRES) 0.1 MG tablet Take 1 tablet (0.1 mg total) by mouth daily. 06/24/20   Harvie Heck, MD  diltiazem (CARDIZEM CD) 180 MG 24 hr capsule Take 1 capsule (180 mg total) by mouth daily. 04/11/21   Bonnielee Haff, MD  docusate sodium (COLACE) 100 MG capsule Take 1 capsule (100 mg total) by mouth every 12 (twelve) hours. 05/01/21   Sherwood Gambler, MD  ferrous sulfate 325 (65 FE) MG tablet Take 1 tablet by mouth every Monday, Wednesday, and Friday. (0900) 07/30/20   [provider]  guaiFENesin 300 MG/15ML LIQD Take 600 mg by mouth every 12 (twelve) hours. (0900 & 2100)    [provider]  hydrALAZINE (APRESOLINE) 10 MG tablet Take 1 tablet (10 mg total) by mouth every 8 (eight) hours as needed (for SBP greater than 180). 04/04/21   Little Ishikawa, MD  HYDROcodone-acetaminophen (NORCO/VICODIN) 5-325 MG tablet Take 1 tablet by mouth every 4 (four) hours as needed for severe pain. 09/17/21   Azucena Cecil, PA-C  hydrOXYzine (ATARAX) 10 MG tablet Take 10 mg by mouth 3 (three) times daily as needed for itching.    [provider]  Ipratropium-Albuterol (COMBIVENT) 20-100 MCG/ACT AERS respimat Inhale 1 puff into the lungs 4 (four)  times daily. (0900, 1300, 1700 & 2100)    [provider]  ipratropium-albuterol (DUONEB) 0.5-2.5 (3) MG/3ML SOLN Take 3 mLs by nebulization every 4 (four) hours as needed (shortness of breath/wheezing).    [provider]  lanthanum (FOSRENOL) 1000 MG chewable tablet Chew 1 tablet (1,000 mg total) by mouth 3 (three) times daily with meals. 04/04/21   Little Ishikawa, MD  levothyroxine (SYNTHROID) 50 MCG tablet Take 50 mcg by mouth daily before breakfast. (0630)    [provider]  melatonin 5 MG TABS Take 5 mg by mouth at bedtime. (2100)    [provider]  Menthol, Topical Analgesic, (BIOFREEZE) 4 % GEL Apply 1 Application topically 3 (three) times daily as needed (pain).    [provider]  Multiple Vitamin (MULTIVITAMIN WITH MINERALS) TABS tablet Take 1 tablet by mouth daily. (0900)    [provider]  Multiple Vitamins-Minerals (DECUBI-VITE PO) Take 1 tablet by mouth in the morning. (0900)    [provider]  naphazoline-glycerin (CLEAR EYES REDNESS) 0.012-0.25 % SOLN Place 1-2 drops into the right eye 3 (three) times daily. 04/23/21   Alma Friendly, MD  neomycin-bacitracin-polymyxin (NEOSPORIN) OINT Apply 1 application topically 3 (three) times daily. To right antecubital area 04/10/21   Bonnielee Haff, MD  Nutritional Supplements (,FEEDING SUPPLEMENT, PROSOURCE PLUS) liquid Take 30 mLs by mouth 2 (two) times daily between meals. Patient not taking: Reported on 09/10/2021 04/23/21   Alma Friendly, MD  Nutritional Supplements (FEEDING SUPPLEMENT, NEPRO CARB STEADY,) LIQD Take 237 mLs by mouth 3 (three) times daily before meals. Patient not taking: Reported on 09/10/2021 04/23/21   Alma Friendly, MD  Nutritional Supplements (NOVASOURCE RENAL) LIQD Take 237 mLs by mouth in the morning and at bedtime. (0800 & 1700)    [provider]  ondansetron (ZOFRAN) 4 MG tablet Take 1 tablet (4 mg total) by mouth daily as  needed for nausea or vomiting. 04/11/21 04/11/22  Bonnielee Haff, MD  oxyCODONE (OXY IR/ROXICODONE) 5 MG immediate release tablet Take 1 tablet (5 mg total) by mouth every 4 (four) hours as needed (pain.). 09/12/21   Ulyses Amor, PA-C  pantoprazole (PROTONIX) 40 MG tablet Take 1 tablet (40 mg total) by mouth 2 (two) times daily. 04/10/21   Bonnielee Haff, MD  polyethylene glycol (MIRALAX / GLYCOLAX) 17 g packet Take 17 g by mouth daily. 05/01/21   Sherwood Gambler, MD  pravastatin (PRAVACHOL) 40 MG tablet Take 40 mg by mouth at bedtime. (2100)    [provider]  prednisoLONE (ORAPRED ODT) 10 MG disintegrating tablet Take 10 mg by mouth See admin instructions. Take 1 tablet (10 mg) by mouth for 2 days    [provider]  predniSONE (DELTASONE) 10 MG tablet Take 20-30 mg by mouth See admin instructions. Take 3 tablets (30 mg) by mouth daily in the evening for 2 days, then decrease to take 2 tablets (20 mg) by mouth in the evening for 2 days then discontinue.    [provider]  predniSONE (DELTASONE) 20 MG tablet Take 20 mg by mouth See admin instructions. Take 2 tablets (40 mg) by mouth in the evening for 2 days.    [provider]  tamsulosin (FLOMAX) 0.4 MG CAPS capsule Take 1 capsule (0.4 mg total) by mouth daily. 04/11/21   Bonnielee Haff, MD    Physical Exam: Vitals:   09/28/21 1800 09/28/21 1815 09/28/21 1846 09/28/21 1943  BP: 109/77 112/77  137/61  Pulse: (!) 134 (!) 134    Resp: 19 15    Temp:   98.1 F (36.7 C)   TempSrc:   Oral   SpO2: 98% 98%    Weight:      Height:       Constitutional: Resting in bed with head elevated, NAD, calm, comfortable Eyes: EOMI, lids and conjunctivae normal ENMT: Mucous membranes are moist. Posterior pharynx clear of any exudate or lesions.Normal dentition.  Neck: normal, supple, no masses. Respiratory: Faint expiratory wheezing throughout. Normal respiratory effort while on 2 L O2 via [place]. No accessory muscle use.   Cardiovascular: Regular rate and rhythm,  no murmurs / rubs / gallops. No extremity edema. 2+ pedal pulses.  RUE aVF with palpable thrill, 2 stitches in place. Abdomen: no tenderness, no masses palpated.  Musculoskeletal: no clubbing / cyanosis. No joint deformity upper and lower extremities. Good ROM, no contractures. Normal muscle tone.  Skin: no rashes, lesions, ulcers. No induration Neurologic: Sensation intact. Strength 5/5 in all 4.  Psychiatric: Alert and oriented x 3. Normal mood.   EKG: Personally reviewed.  Initial EKG showed SVT, rate 142.  Repeat EKG after low-dose IV metoprolol showed atrial flutter with variable block, rate 100.  Assessment/Plan Principal Problem:   Paroxysmal atrial flutter (HCC) Active Problems:   CAP (community acquired pneumonia)   Hyperkalemia   ESRD (end stage renal disease) on MWF dialysis ([place])   COPD (chronic obstructive pulmonary disease) (HCC)   Chronic respiratory failure with hypoxia (HCC)   CAD (coronary artery disease)   History of CVA (cerebrovascular accident)   Paroxysmal atrial fibrillation ([place])   Hypertension   Hypothyroidism   ADI DORO is a 87 y.o. male with medical history significant for ESRD on MWF HD, CAD s/p BMS to RCA in 2012, COPD, chronic respiratory failure with hypoxia on 3 L O2 via [place], paroxysmal atrial fibrillation on anticoagulation (history of hemorrhagic renal cyst and upper GI bleed), history of CVA, hypothyroidism, HTN, HLD, anemia of CKD who is admitted with paroxysmal atrial flutter.  Assessment and Plan: * Paroxysmal atrial flutter ([place]) History of post cath paroxysmal atrial fibrillation in 2012.  Not started on anticoagulation at that time due to known hemorrhagic cyst.  No known recurrence of A-fib since that time but in the ED was found to have new atrial flutter with variable block.  Has since converted to sinus rhythm.  Admitted earlier this year with upper GI bleed and also reports recent  nosebleeds. -Cardiology to see, appreciate assistance -Will hold addition of full dose anticoagulation for now -Continue aspirin 81 mg daily -Continue home diltiazem 180 mg daily -Keep on telemetry -Check magnesium and TSH -Give Lokelma for hyperkalemia  Hyperkalemia Potassium 5.5.  Give Lokelma 10 g once now.  CAP (community acquired pneumonia) Increasing bibasilar opacities seen on CXR.  Concern is for developing pneumonia.  Has mild leukocytosis but otherwise afebrile.  SPO2 stable on home 2-3 L O2 via [place]. -Continue empiric IV CTX and azithromycin for now, deescalate as able  ESRD (end stage renal disease) on MWF dialysis ([place]) No emergent need for tonight.  Nephrology notified for routine HD needs tomorrow if remains in hospital.  Underwent recent I&D to his RUE AV fistula on 7/20, has 2 sutures in place.  Chronic respiratory failure with hypoxia (HCC) Stable on home 2-3 L O2 via [place].  COPD (chronic obstructive pulmonary disease) (HCC) Has some mild wheezing on exam.  Has not received his breathing treatment so far today. -Placed on scheduled DuoNebs with albuterol as needed  CAD (coronary artery disease) S/p BMS to RCA in 2012 Stable, denies chest pain.  Continue aspirin and statin.  Hypothyroidism Continue Synthroid.  Hypertension Currently normotensive.  Continue home diltiazem 180 mg daily.  Hold clonidine due to transient hypotension earlier in the ED.  History of CVA (cerebrovascular accident) Continue aspirin and statin.  DVT prophylaxis: heparin injection 5,000 Units Start: 09/28/21 2200 Code Status: Full code, confirmed with patient on admission Family Communication: Discussed with patient, he has discussed with family Disposition Plan: From SNF, dispo pending clinical progress Consults called: Cardiology, nephrology notified via secure chat Severity  of Illness: The appropriate patient status for this patient is OBSERVATION. Observation status is judged to be  reasonable and necessary in order to provide the required intensity of service to ensure the patient's safety. The patient's presenting symptoms, physical exam findings, and initial radiographic and laboratory data in the context of their medical condition is felt to place them at decreased risk for further clinical deterioration. Furthermore, it is anticipated that the patient will be medically stable for discharge from the hospital within 2 midnights of admission.   Zada Finders MD [facility]  If 7PM-7AM, please contact night-coverage [email or web address]  09/28/2021, 8:19 PM

## 2021-09-29 DIAGNOSIS — I252 Old myocardial infarction: Secondary | ICD-10-CM | POA: Diagnosis not present

## 2021-09-29 DIAGNOSIS — E039 Hypothyroidism, unspecified: Secondary | ICD-10-CM | POA: Diagnosis present

## 2021-09-29 DIAGNOSIS — Z91030 Bee allergy status: Secondary | ICD-10-CM | POA: Diagnosis not present

## 2021-09-29 DIAGNOSIS — I4892 Unspecified atrial flutter: Secondary | ICD-10-CM | POA: Diagnosis not present

## 2021-09-29 DIAGNOSIS — E875 Hyperkalemia: Secondary | ICD-10-CM | POA: Diagnosis present

## 2021-09-29 DIAGNOSIS — Z79890 Hormone replacement therapy (postmenopausal): Secondary | ICD-10-CM | POA: Diagnosis not present

## 2021-09-29 DIAGNOSIS — M898X9 Other specified disorders of bone, unspecified site: Secondary | ICD-10-CM | POA: Diagnosis present

## 2021-09-29 DIAGNOSIS — Z888 Allergy status to other drugs, medicaments and biological substances status: Secondary | ICD-10-CM | POA: Diagnosis not present

## 2021-09-29 DIAGNOSIS — J189 Pneumonia, unspecified organism: Secondary | ICD-10-CM | POA: Diagnosis present

## 2021-09-29 DIAGNOSIS — I120 Hypertensive chronic kidney disease with stage 5 chronic kidney disease or end stage renal disease: Secondary | ICD-10-CM | POA: Diagnosis present

## 2021-09-29 DIAGNOSIS — Z8673 Personal history of transient ischemic attack (TIA), and cerebral infarction without residual deficits: Secondary | ICD-10-CM | POA: Diagnosis not present

## 2021-09-29 DIAGNOSIS — I2510 Atherosclerotic heart disease of native coronary artery without angina pectoris: Secondary | ICD-10-CM | POA: Diagnosis present

## 2021-09-29 DIAGNOSIS — D631 Anemia in chronic kidney disease: Secondary | ICD-10-CM | POA: Diagnosis present

## 2021-09-29 DIAGNOSIS — Z992 Dependence on renal dialysis: Secondary | ICD-10-CM | POA: Diagnosis not present

## 2021-09-29 DIAGNOSIS — Z87891 Personal history of nicotine dependence: Secondary | ICD-10-CM | POA: Diagnosis not present

## 2021-09-29 DIAGNOSIS — J9611 Chronic respiratory failure with hypoxia: Secondary | ICD-10-CM | POA: Diagnosis present

## 2021-09-29 DIAGNOSIS — I4891 Unspecified atrial fibrillation: Secondary | ICD-10-CM | POA: Diagnosis present

## 2021-09-29 DIAGNOSIS — E785 Hyperlipidemia, unspecified: Secondary | ICD-10-CM | POA: Diagnosis present

## 2021-09-29 DIAGNOSIS — I480 Paroxysmal atrial fibrillation: Secondary | ICD-10-CM | POA: Diagnosis present

## 2021-09-29 DIAGNOSIS — Z7901 Long term (current) use of anticoagulants: Secondary | ICD-10-CM | POA: Diagnosis not present

## 2021-09-29 DIAGNOSIS — I471 Supraventricular tachycardia: Secondary | ICD-10-CM | POA: Diagnosis present

## 2021-09-29 DIAGNOSIS — J440 Chronic obstructive pulmonary disease with acute lower respiratory infection: Secondary | ICD-10-CM | POA: Diagnosis present

## 2021-09-29 DIAGNOSIS — N186 End stage renal disease: Secondary | ICD-10-CM | POA: Diagnosis present

## 2021-09-29 DIAGNOSIS — I4430 Unspecified atrioventricular block: Secondary | ICD-10-CM | POA: Diagnosis present

## 2021-09-29 LAB — CBC
HCT: 28.3 % — ABNORMAL LOW (ref 39.0–52.0)
HCT: 27.7 % — ABNORMAL LOW (ref 39.0–52.0)
Hemoglobin: 9 g/dL — ABNORMAL LOW (ref 13.0–17.0)
Hemoglobin: 8.9 g/dL — ABNORMAL LOW (ref 13.0–17.0)
MCH: 29.5 pg (ref 26.0–34.0)
MCH: 29.1 pg (ref 26.0–34.0)
MCHC: 31.8 g/dL (ref 30.0–36.0)
MCHC: 32.1 g/dL (ref 30.0–36.0)
MCV: 92.8 fL (ref 80.0–100.0)
MCV: 90.5 fL (ref 80.0–100.0)
Platelets: 206 10*3/uL (ref 150–400)
Platelets: 210 10*3/uL (ref 150–400)
RBC: 3.05 MIL/uL — ABNORMAL LOW (ref 4.22–5.81)
RBC: 3.06 MIL/uL — ABNORMAL LOW (ref 4.22–5.81)
RDW: 17.1 % — ABNORMAL HIGH (ref 11.5–15.5)
RDW: 17 % — ABNORMAL HIGH (ref 11.5–15.5)
WBC: 9 10*3/uL (ref 4.0–10.5)
WBC: 9.7 10*3/uL (ref 4.0–10.5)
nRBC: 0 % (ref 0.0–0.2)
nRBC: 0 % (ref 0.0–0.2)

## 2021-09-29 LAB — BASIC METABOLIC PANEL
Anion gap: 18 — ABNORMAL HIGH (ref 5–15)
BUN: 75 mg/dL — ABNORMAL HIGH (ref 8–23)
CO2: 21 mmol/L — ABNORMAL LOW (ref 22–32)
Calcium: 8.3 mg/dL — ABNORMAL LOW (ref 8.9–10.3)
Chloride: 100 mmol/L (ref 98–111)
Creatinine, Ser: 6.55 mg/dL — ABNORMAL HIGH (ref 0.61–1.24)
GFR, Estimated: 8 mL/min — ABNORMAL LOW (ref >60)
Glucose, Bld: 145 mg/dL — ABNORMAL HIGH (ref 70–99)
Potassium: 5 mmol/L (ref 3.5–5.1)
Sodium: 139 mmol/L (ref 135–145)

## 2021-09-29 LAB — RENAL FUNCTION PANEL
Albumin: 2.6 g/dL — ABNORMAL LOW (ref 3.5–5.0)
Anion gap: 15 (ref 5–15)
BUN: 75 mg/dL — ABNORMAL HIGH (ref 8–23)
CO2: 21 mmol/L — ABNORMAL LOW (ref 22–32)
Calcium: 8.3 mg/dL — ABNORMAL LOW (ref 8.9–10.3)
Chloride: 102 mmol/L (ref 98–111)
Creatinine, Ser: 6.86 mg/dL — ABNORMAL HIGH (ref 0.61–1.24)
GFR, Estimated: 7 mL/min — ABNORMAL LOW (ref >60)
Glucose, Bld: 80 mg/dL (ref 70–99)
Phosphorus: 9 mg/dL — ABNORMAL HIGH (ref 2.5–4.6)
Potassium: 4.8 mmol/L (ref 3.5–5.1)
Sodium: 138 mmol/L (ref 135–145)

## 2021-09-29 LAB — TSH: TSH: 28.658 u[IU]/mL — ABNORMAL HIGH (ref 0.350–4.500)

## 2021-09-29 LAB — HEPATITIS B SURFACE ANTIBODY,QUALITATIVE: Hep B S Ab: NONREACTIVE

## 2021-09-29 LAB — HEPATITIS C ANTIBODY: HCV Ab: NONREACTIVE

## 2021-09-29 LAB — HEPATITIS B CORE ANTIBODY, TOTAL: Hep B Core Total Ab: NONREACTIVE

## 2021-09-29 MED ORDER — MUPIROCIN 2 % EX OINT
1.0000 | TOPICAL_OINTMENT | Freq: Two times a day (BID) | CUTANEOUS | Status: DC
  Administered 2021-09-29 – 2021-09-30 (×4): 1 via NASAL
  Filled 2021-09-29: qty 22

## 2021-09-29 MED ORDER — LIDOCAINE HCL (PF) 1 % IJ SOLN: 5.0000 mL | INTRAMUSCULAR | Status: DC | PRN

## 2021-09-29 MED ORDER — PENTAFLUOROPROP-TETRAFLUOROETH EX AERO
INHALATION_SPRAY | CUTANEOUS | Status: AC
  Filled 2021-09-29: qty 30

## 2021-09-29 MED ORDER — MAGNESIUM SULFATE IN D5W 1-5 GM/100ML-% IV SOLN
1.0000 g | Freq: Once | INTRAVENOUS | Status: AC
Start: 2021-09-29 — End: 2021-09-29
  Administered 2021-09-29: 1 g via INTRAVENOUS
  Filled 2021-09-29: qty 100

## 2021-09-29 MED ORDER — HEPARIN SODIUM (PORCINE) 1000 UNIT/ML DIALYSIS: 1000.0000 [IU] | INTRAMUSCULAR | Status: DC | PRN

## 2021-09-29 MED ORDER — CHLORHEXIDINE GLUCONATE CLOTH 2 % EX PADS
6.0000 | MEDICATED_PAD | Freq: Every day | CUTANEOUS | Status: DC
  Administered 2021-09-29: 6 via TOPICAL

## 2021-09-29 MED ORDER — DILTIAZEM HCL ER COATED BEADS 240 MG PO CP24
240.0000 mg | ORAL_CAPSULE | Freq: Every day | ORAL | Status: DC
  Administered 2021-09-29 – 2021-09-30 (×2): 240 mg via ORAL
  Filled 2021-09-29 (×2): qty 1

## 2021-09-29 MED ORDER — IPRATROPIUM-ALBUTEROL 0.5-2.5 (3) MG/3ML IN SOLN
3.0000 mL | Freq: Three times a day (TID) | RESPIRATORY_TRACT | Status: DC
  Administered 2021-09-29 – 2021-09-30 (×4): 3 mL via RESPIRATORY_TRACT
  Filled 2021-09-29 (×5): qty 3

## 2021-09-29 MED ORDER — PENTAFLUOROPROP-TETRAFLUOROETH EX AERO: 1.0000 | INHALATION_SPRAY | CUTANEOUS | Status: DC | PRN

## 2021-09-29 MED ORDER — ANTICOAGULANT SODIUM CITRATE 4% (200MG/5ML) IV SOLN: 5.0000 mL | Status: DC | PRN

## 2021-09-29 MED ORDER — DARBEPOETIN ALFA 60 MCG/0.3ML IJ SOSY
60.0000 ug | PREFILLED_SYRINGE | INTRAMUSCULAR | Status: DC
  Filled 2021-09-29: qty 0.3

## 2021-09-29 MED ORDER — GUAIFENESIN ER 600 MG PO TB12
600.0000 mg | ORAL_TABLET | Freq: Two times a day (BID) | ORAL | Status: DC
Start: 2021-09-29 — End: 2021-10-01
  Administered 2021-09-29 – 2021-09-30 (×3): 600 mg via ORAL
  Filled 2021-09-29 (×3): qty 1

## 2021-09-29 MED ORDER — ALTEPLASE 2 MG IJ SOLR: 2.0000 mg | Freq: Once | INTRAMUSCULAR | Status: DC | PRN

## 2021-09-29 MED ORDER — LIDOCAINE-PRILOCAINE 2.5-2.5 % EX CREA: 1.0000 | TOPICAL_CREAM | CUTANEOUS | Status: DC | PRN

## 2021-09-29 MED ORDER — SODIUM ZIRCONIUM CYCLOSILICATE 10 G PO PACK
10.0000 g | PACK | Freq: Once | ORAL | Status: AC
Start: 2021-09-29 — End: 2021-09-29
  Administered 2021-09-29: 10 g via ORAL
  Filled 2021-09-29: qty 1

## 2021-09-29 NOTE — Progress Notes (Signed)
Wayne Meadows  VZC:[id number] DOB: 09/18/1934 DOA: 09/28/2021 [facility]CP: [name]    Brief Narrative:  87 year old with a history of ESRD on HD MWF, CAD status post BMS to RCA 2012, COPD requiring chronic 3 L O2 support, PAF not on anticoagulation due to hemorrhagic renal cyst and UGIB, CVA, hypothyroidism, HTN, HLD, and anemia of CKD who was sent to the ER from his SNF with reported SOB.  He reported feeling dyspneic immediately upon wakening, at which time he also noted his heart rate was in the 140s.  She  Consultants:  Nephrology  Goals of Care:  Code Status: Full Code   DVT prophylaxis: Subcutaneous heparin  Interim Hx: Afebrile.  Vital signs stable.  The patient is seen postdialysis in his hospital room.  He feels short of breath and is tachycardic with heart rate 140 at the time of my visit.  He denies chest pressure or pain.  He denies headache nausea or vomiting.  Assessment & Plan:  History of remote atrial fibrillation - acute atrial flutter with RVR History of post cath PAF 2012 w/ no known recurrence - in aflutter at presentation, and has since converted to sinus rhythm - not anticoagulated due to HAS-BLED 4, CHA2DS2-VASc 5 , but consideration can be given to initiating anticoagulation and outpatient follow-up with his cardiologist if he remains stable - continue to titrate rate controlling medications and follow on telemetry  Hyperkalemia Will be managed by dialysis  Hypomagnesemia Carefully supplement magnesium in setting of atrial arrhythmia with concurrent ESRD  Possible Pneumonia bibasilar opacities seen on CXR - leukocytosis noted but afebrile - O2 stable on home 2-3 L O2 via [place] -will plan to complete 3 days of empiric antibiotic therapy but bacterial pneumonia not felt to be highly likely  ESRD on HD MWF  Nephrology following - underwent recent I&D to his RUE AV fistula on 7/20  COPD w/ Chronic hypoxic respiratory failure Stable on home 2-3 L O2 via  [place]  CAD S/p BMS to RCA in 2012 Stable, denies chest pain - continue aspirin and statin  Hypothyroidism Continue Synthroid  Hypertension  History of ischemic thrombotic CVA  Continue aspirin and statin  Family Communication: No family present at time of exam Disposition: From SNF -plan will be to return to SNF when rate controlled and stable   Objective: Blood pressure 130/64, pulse 82, temperature 98.1 F (36.7 C), temperature source Oral, resp. rate 16, height '6\' 2"'$  (1.88 m), weight 80.1 kg, SpO2 96 %.  Intake/Output Summary (Last 24 hours) at 09/29/2021 0951 Last data filed at 09/29/2021 0518 Gross per 24 hour  Intake 460 ml  Output --  Net 460 ml   Filed Weights   09/28/21 1423 09/29/21 0500 09/29/21 0810  Weight: 77.1 kg 80.6 kg 80.1 kg    Examination: General: Not in extremis Lungs: Mild tachypnea with good air movement throughout with no focal crackles Cardiovascular: Irregularly irregular and tachycardic at approximately 140 bpm Abdomen: Nontender, nondistended, soft, bowel sounds positive, no rebound, no ascites, no appreciable mass Extremities: No significant cyanosis, clubbing, or edema bilateral lower extremities  CBC: Recent Labs  Lab 09/28/21 1452 09/29/21 0311 09/29/21 0803  WBC 13.6* 9.0 9.7  NEUTROABS 10.6*  --   --   HGB 11.3* 9.0* 8.9*  HCT 35.4* 28.3* 27.7*  MCV 92.9 92.8 90.5  PLT 262 206 128   Basic Metabolic Panel: Recent Labs  Lab 09/28/21 1452 09/28/21 2222 09/29/21 0311 09/29/21 0803  NA 138  --  139 138  K 5.5* 5.6* 5.0 4.8  CL 100  --  100 102  CO2 23  --  21* 21*  GLUCOSE 93  --  145* 80  BUN 66*  --  75* 75*  CREATININE 5.92*  --  6.55* 6.86*  CALCIUM 9.0  --  8.3* 8.3*  MG  --  1.5*  --   --   PHOS  --   --   --  9.0*   GFR: Estimated Creatinine Clearance: 9.1 mL/min (A) (by C-G formula based on SCr of 6.86 mg/dL (H)).  Liver Function Tests: Recent Labs  Lab 09/29/21 0803  ALBUMIN 2.6*   HbA1C: Hgb A1c MFr Bld   Date/Time Value Ref Range Status  07/09/2020 05:00 AM 7.3 (H) 4.8 - 5.6 % Final    Comment:    (NOTE) Pre diabetes:          5.7%-6.4%  Diabetes:              >6.4%  Glycemic control for   <7.0% adults with diabetes     Scheduled Meds:  aspirin EC  81 mg Oral Daily   Chlorhexidine Gluconate Cloth  6 each Topical Q0600   darbepoetin (ARANESP) injection - DIALYSIS  60 mcg Intravenous Q Mon-HD   diltiazem  180 mg Oral Daily   guaiFENesin  600 mg Oral BID   heparin  5,000 Units Subcutaneous Q8H   ipratropium-albuterol  3 mL Inhalation QID   lanthanum  1,000 mg Oral TID WC   levothyroxine  50 mcg Oral QAC breakfast   melatonin  5 mg Oral QHS   mupirocin ointment  1 Application Nasal BID   pentafluoroprop-tetrafluoroeth       pravastatin  40 mg Oral QHS   sodium chloride flush  3 mL Intravenous Q12H   tamsulosin  0.4 mg Oral Daily   Continuous Infusions:  anticoagulant sodium citrate     azithromycin (ZITHROMAX) 500 mg in sodium chloride 0.9 % 250 mL IVPB     cefTRIAXone (ROCEPHIN)  IV       LOS: 0 days   Cherene Altes, MD [facility] Office  [phone] Pager - Text Page per Shea Evans  If 7PM-7AM, please contact night-coverage per Amion 09/29/2021, 9:51 AM

## 2021-09-29 NOTE — Progress Notes (Signed)
Patient taken to hemodialysis at this time with transport.  Placed on [facility] via tank  no c/o pain at this time.  CCMD made aware of patient going to HD.

## 2021-09-29 NOTE — Progress Notes (Signed)
Pt receives out-pt HD at [facility] on MWF. Will assist as needed.   Labib Cwynar Renal Navigator [phone] 

## 2021-09-29 NOTE — TOC Initial Note (Addendum)
Transition of Care [facility]) - Initial/Assessment Note    Patient Details  Name: Wayne Meadows MRN: [id number] Date of Birth: March 01, 1934  Transition of Care [facility]) CM/SW Contact:    Milas Gain, [place] Phone Number: 09/29/2021, 11:42 AM  Clinical Narrative:                  CSW spoke with patient at bedside.Patient reports he is from [facility] long term. Patient confirms plan is to return when medically ready for dc. Janie with [facility] confirmed no FL2 needed for patient to return.Patient is HD. CSW informed renal navigator. CSW will continue to follow and assist with patients dc planning needs.       Patient Goals and CMS Choice        Expected Discharge Plan and Services                                                Prior Living Arrangements/Services                       Activities of Daily Living      Permission Sought/Granted                  Emotional Assessment              Admission diagnosis:  Paroxysmal atrial flutter ([place]) [I48.92] New onset atrial flutter ([place]) [I48.92] Community acquired pneumonia, unspecified laterality [J18.9] Patient Active Problem List   Diagnosis Date Noted   Chronic respiratory failure with hypoxia ([place]) 09/28/2021   Paroxysmal atrial flutter ([place]) 09/28/2021   Hyperkalemia 09/28/2021   ESRD (end stage renal disease) ([place]) 09/11/2021   Partial small bowel obstruction ([place]) 08/17/2021   Right lower lobe pneumonia 08/13/2021   Acute on chronic respiratory failure with hypoxia ([place]) 08/13/2021   Arteriovenous fistula infection ([place]) 04/18/2021   Gastric ulcer without hemorrhage or perforation    Duodenal ulcer    Elevated INR    Acute upper GI bleed 04/06/2021   ESRD (end stage renal disease) on MWF dialysis ([place]) 04/06/2021   Supratherapeutic INR 04/06/2021   Proteinuria 03/07/2021   AKI (acute kidney injury) ([place]) 03/05/2021   Hypothyroidism 03/05/2021   Anxiety 03/05/2021    Recurrent cellulitis of lower leg 03/04/2021   Chronic pancreatitis ([place]) 12/24/2020   Nausea and vomiting in adult 06/22/2020   Esophageal stricture    Hyperbilirubinemia 04/11/2016   Elevated lactic acid level    Segmental colitis ([place]) 09/04/2015   Noninfectious gastroenteritis, unspecified    Benign neoplasm of transverse colon    Benign neoplasm of colon    Bright red blood per rectum    Dysphagia    Renal cell cancer ([place])    Hypertension    Centrilobular emphysema ([place])    Blood in stool 08/19/2015   Rectal bleeding 08/19/2015   CAP (community acquired pneumonia) 04/08/2014   Overweight (BMI 25.0-29.9) 04/08/2014   Healthcare-associated pneumonia 10/26/2011   Cholelithiasis 04/26/2011   Paroxysmal atrial fibrillation ([place]) 04/26/2011   Chest pain 03/19/2011   Unstable angina ([place]) 03/18/2011    Class: Acute   Lung nodule 03/02/2011   Liver lesion 03/02/2011   Other hyperlipidemia 03/02/2011   Abnormal CT of the abdomen 02/23/2011   Non Q wave myocardial infarction ([place]) 02/22/2011    Class: Acute   History of CVA (cerebrovascular  accident) 02/22/2011   CAD (coronary artery disease) 02/22/2011   COPD (chronic obstructive pulmonary disease) ([place]) 02/22/2011   Tobacco abuse, 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/22/2011   PCP:  Clinic, [place]:   [id number] [id number]: [phone]: [phone]    Social Determinants of Health (SDOH) Interventions    Readmission Risk Interventions    03/21/2021    1:21 PM 03/19/2021    1:23 PM  Readmission Risk Prevention Plan  Transportation Screening Complete   HRI or Home Care Consult Complete   SW Recovery Care/Counseling Consult Complete Complete  Skilled Nursing Facility Complete Complete

## 2021-09-29 NOTE — Consult Note (Signed)
[facility] Nurse Consult Note: Patient receiving care in [place] Reason for Consult: wound on back Wound type: Small circular wound on the middle of the back. Patient states it started 4 months ago when he had to have an X-Ray and the board that was placed under him hit his spine.  Pressure Injury POA: NA Measurement: 1 x 1 x 0.5 Wound bed: pink Drainage (amount, consistency, odor) scant serous  Periwound: macerated  Dressing procedure/placement/frequency: Clean the wound on the back with NS, allow to air dry then place a quarter size piece of Aquacel Advantage Kellie Simmering # [phone]) over the wound and secure with a foam dressing. Change the Aquacel daily.  Monitor the wound area(s) for worsening of condition such as: Signs/symptoms of infection, increase in size, development of or worsening of odor, development of pain, or increased pain at the affected locations.   Notify the medical team if any of these develop.  Thank you for the consult. [place] nurse will not follow at this time.   Please re-consult the [place] team if needed.  Cathlean Marseilles Tamala Julian, MSN, RN, [place], Lysle Pearl, [facility] Wound Treatment Associate Pager [phone]

## 2021-09-29 NOTE — Care Management Obs Status (Signed)
[place] NOTIFICATION   Patient Details  Name: ADLEY MAZUROWSKI MRN: [id number] Date of Birth: April 24, 1934   Medicare Observation Status Notification Given:  Yes    Bethena Roys, RN 09/29/2021, 4:15 PM

## 2021-09-29 NOTE — Progress Notes (Signed)
Received patient in bed to unit. Alert and oriented x 4. Informed consent signed and in  chart.   Treatment initiated: [place] Treatment completed: 1211  Patient tolerated well. Transported back to the room alert, without acute distress. Report given to patient's floor nurse.   Access used: [facility] [place] Access issues: none   Total UF removed: 4L Medication(s) given: none  Post HD VS: 155/68 85 93% on 2L 105 15  Post HD weight: 76.7kg    [name]

## 2021-09-29 NOTE — Consult Note (Signed)
Renal Service Consult Note [facility]  TONEY DIFATTA 09/29/2021 Sol Blazing, MD Requesting Physician: Dr. Thereasa Solo  Reason for Consult: ESRD pt admitted for SOB HPI: The patient is a 87 y.o. year-old w/ hx of COPD, CAD sp PCI, hx CVA, HTN, atrial fib who presented to ED from SNF yesterday for SOB. SOB lasting < 24 hrs. HR at SNF was 140's, +pleuritic CP also and clear sputum production. Has chronic cough due to COPD. No leg swelling. IIN eD BP 116/70, HR 142, temp 90 F, WBC 13K, Hb 11, K+ 5.5. Creat 5.9.  CXR showed bibasilar opacities c/w effusion vs infiltrates. EKG showed afib RVR, pt was given IV metoprolol and IV 250 cc NS. HR improved to 100 w/ aflutter w/ variable block. He then got home dose of Cardizem CD '180mg'$  x 1. IV abx were started for possible CAP. Pt was admitted. We are asked to see for ESRD.    Pt seen on HD. Resting and groggy, vague historian. No active SOB, cough or CP.  ROS - denies CP, no joint pain, no HA, no blurry vision, no rash, no diarrhea, no nausea/ vomiting, no dysuria, no difficulty voiding   Past Medical History  Past Medical History:  Diagnosis Date   Abnormal echocardiogram 03/08/2021   Abnormal finding on GI tract imaging    Acute blood loss anemia    Acute pancreatitis 08/12/2020   Arthritis    Carotid stenosis    Community acquired pneumonia 04/06/2021   COPD (chronic obstructive pulmonary disease) ([place])    Coronary artery disease    S/p PCI 2011;  NSTEMI 12/12:  LHC/PCI 02/23/11: LAD 60% after the septal perforator, D1 occluded with distal collaterals, proximal RI 30-40%, AV circumflex stent patent with 60% stenosis after the stent, RCA 99%, EF 60-65%.  His RCA was treated with a bare-metal stent   CVA (cerebral infarction) 2011   Right cerebral; total obstruction of the right ICA   Diverticulitis    Hypertension    Paroxysmal atrial fibrillation with RVR ([place]) 04/09/2021   Pleuritic chest pain 04/08/2014   Pressure injury of  skin 03/30/2021   Rash 03/05/2021   Rectal bleeding 07/2015   Refractory nausea and vomiting 06/20/2020   Renal carcinoma (HCC)    Sepsis, unspecified organism ([place]) 04/11/2016   Stroke ([place])    Tobacco abuse, in remission    Past Surgical History  Past Surgical History:  Procedure Laterality Date   AV FISTULA PLACEMENT Right 04/03/2021   Procedure: ARTERIOVENOUS (AV) CREATION OF RIGHT ARM BRACHIOCEPHALIC FISTULA;  Surgeon: Cherre Robins, MD;  Location: [place];  Service: Vascular;  Laterality: Right;   BIOPSY  04/08/2021   Procedure: BIOPSY;  Surgeon: Lavena Bullion, DO;  Location: [place];  Service: Gastroenterology;;   COLON SURGERY     COLONOSCOPY N/A 08/22/2015   Procedure: COLONOSCOPY;  Surgeon: [name], MD;  Location: [facility] ENDOSCOPY;  Service: Endoscopy;  Laterality: N/A;   ESOPHAGOGASTRODUODENOSCOPY N/A 07/18/2020   Procedure: ESOPHAGOGASTRODUODENOSCOPY (EGD);  Surgeon: Milus Banister, MD;  Location: [facility];  Service: Endoscopy;  Laterality: N/A;   ESOPHAGOGASTRODUODENOSCOPY (EGD) WITH PROPOFOL N/A 06/21/2020   Procedure: ESOPHAGOGASTRODUODENOSCOPY (EGD) WITH PROPOFOL;  Surgeon: Irene Shipper, MD;  Location: [facility] ENDOSCOPY;  Service: Endoscopy;  Laterality: N/A;   ESOPHAGOGASTRODUODENOSCOPY (EGD) WITH PROPOFOL N/A 04/08/2021   Procedure: ESOPHAGOGASTRODUODENOSCOPY (EGD) WITH PROPOFOL;  Surgeon: Lavena Bullion, DO;  Location: [facility];  Service: Gastroenterology;  Laterality: N/A;   EUS N/A  07/18/2020   Procedure: UPPER ENDOSCOPIC ULTRASOUND (EUS) RADIAL;  Surgeon: Milus Banister, MD;  Location: [facility] ENDOSCOPY;  Service: Endoscopy;  Laterality: N/A;   FINE NEEDLE ASPIRATION N/A 07/18/2020   Procedure: FINE NEEDLE ASPIRATION (FNA) LINEAR;  Surgeon: Milus Banister, MD;  Location: [facility] ENDOSCOPY;  Service: Endoscopy;  Laterality: N/A;   HEMOSTASIS CLIP PLACEMENT  04/08/2021   Procedure: HEMOSTASIS CLIP PLACEMENT;  Surgeon: Lavena Bullion, DO;  Location: [place];  Service: Gastroenterology;;   hip relacement     INCISION AND DRAINAGE Right 09/11/2021   Procedure: INCISION AND DRAINAGE OF RIGHT ARM FISTULA;  Surgeon: Cherre Robins, MD;  Location: [facility] OR;  Service: Vascular;  Laterality: Right;   IR FLUORO GUIDE CV LINE RIGHT  03/31/2021   IR REMOVAL TUN CV CATH W/O FL  08/15/2021   IR US GUIDE VASC ACCESS RIGHT  03/31/2021   KIDNEY SURGERY     LEFT HEART CATH AND CORONARY ANGIOGRAPHY N/A 07/09/2020   Procedure: LEFT HEART CATH AND CORONARY ANGIOGRAPHY;  Surgeon: Leonie Man, MD;  Location: [facility] CV LAB;  Service: Cardiovascular;  Laterality: N/A;   LEFT HEART CATHETERIZATION WITH CORONARY ANGIOGRAM N/A 02/23/2011   Procedure: LEFT HEART CATHETERIZATION WITH CORONARY ANGIOGRAM;  Surgeon: [name], MD;  Location: [facility] CATH LAB;  Service: Cardiovascular;  Laterality: N/A;   LEFT HEART CATHETERIZATION WITH CORONARY ANGIOGRAM N/A 03/18/2011   Procedure: LEFT HEART CATHETERIZATION WITH CORONARY ANGIOGRAM;  Surgeon: [name], MD;  Location: [facility] CATH LAB;  Service: Cardiovascular;  Laterality: N/A;   PERCUTANEOUS CORONARY STENT INTERVENTION (PCI-S) N/A 02/23/2011   Procedure: PERCUTANEOUS CORONARY STENT INTERVENTION (PCI-S);  Surgeon: [name], MD;  Location: [facility] CATH LAB;  Service: Cardiovascular;  Laterality: N/A;   TEMPORARY PACEMAKER INSERTION N/A 02/23/2011   Procedure: TEMPORARY PACEMAKER INSERTION;  Surgeon: [name], MD;  Location: [facility] CATH LAB;  Service: Cardiovascular;  Laterality: N/A;   THROMBECTOMY W/ EMBOLECTOMY Right 04/03/2021   Procedure: THROMBECTOMY ARTERIOVENOUS FISTULA;  Surgeon: Cherre Robins, MD;  Location: [facility] OR;  Service: Vascular;  Laterality: Right;   Family History  Family History  Problem Relation Age of Onset   Heart attack Other 88   Social History  reports that he quit smoking about 14 years ago. His smoking use included cigarettes. He has never used smokeless tobacco. He reports that he  does not currently use alcohol after a past usage of about 1.0 standard drink of alcohol per week. He reports that he does not use drugs. Allergies  Allergies  Allergen Reactions   Bee Venom Anaphylaxis    Has epi pen   Influenza Vaccines Other (See Comments)    "Mortally sick for 2 weeks"   Home medications Prior to Admission medications   Medication Sig Start Date End Date Taking? Authorizing Provider  acetaminophen (TYLENOL) 325 MG tablet Take 2 tablets (650 mg total) by mouth every 6 (six) hours as needed for mild pain (or Fever >/= 101). 04/10/21   Bonnielee Haff, MD  albuterol (VENTOLIN HFA) 108 (90 Base) MCG/ACT inhaler Inhale 2 puffs into the lungs every 6 (six) hours as needed for shortness of breath.    [provider]  Amino Acids-Protein Hydrolys (FEEDING SUPPLEMENT, PRO-STAT SUGAR FREE 64,) LIQD Take 30 mLs by mouth every evening. (1700)    [provider]  aspirin EC 81 MG tablet Take 81 mg by mouth daily.  (0900)    [provider]  bisacodyl (DULCOLAX) 10  MG suppository Place 10 mg rectally daily as needed for moderate constipation.    [provider]  busPIRone (BUSPAR) 5 MG tablet Take 5 mg by mouth 2 (two) times daily. (0800 & 2000)    [provider]  camphor-menthol (SARNA) lotion Apply topically as needed for itching. Patient taking differently: Apply 1 Application topically as needed for itching. 04/23/21   Alma Friendly, MD  cloNIDine (CATAPRES) 0.1 MG tablet Take 1 tablet (0.1 mg total) by mouth daily. 06/24/20   Harvie Heck, MD  diltiazem (CARDIZEM CD) 180 MG 24 hr capsule Take 1 capsule (180 mg total) by mouth daily. 04/11/21   Bonnielee Haff, MD  docusate sodium (COLACE) 100 MG capsule Take 1 capsule (100 mg total) by mouth every 12 (twelve) hours. 05/01/21   Sherwood Gambler, MD  ferrous sulfate 325 (65 FE) MG tablet Take 1 tablet by mouth every Monday, Wednesday, and Friday. (0900) 07/30/20   [provider]   guaiFENesin 300 MG/15ML LIQD Take 600 mg by mouth every 12 (twelve) hours. (0900 & 2100)    [provider]  hydrALAZINE (APRESOLINE) 10 MG tablet Take 1 tablet (10 mg total) by mouth every 8 (eight) hours as needed (for SBP greater than 180). 04/04/21   Little Ishikawa, MD  HYDROcodone-acetaminophen (NORCO/VICODIN) 5-325 MG tablet Take 1 tablet by mouth every 4 (four) hours as needed for severe pain. 09/17/21   Azucena Cecil, PA-C  hydrOXYzine (ATARAX) 10 MG tablet Take 10 mg by mouth 3 (three) times daily as needed for itching.    [provider]  Ipratropium-Albuterol (COMBIVENT) 20-100 MCG/ACT AERS respimat Inhale 1 puff into the lungs 4 (four) times daily. (0900, 1300, 1700 & 2100)    [provider]  ipratropium-albuterol (DUONEB) 0.5-2.5 (3) MG/3ML SOLN Take 3 mLs by nebulization every 4 (four) hours as needed (shortness of breath/wheezing).    [provider]  lanthanum (FOSRENOL) 1000 MG chewable tablet Chew 1 tablet (1,000 mg total) by mouth 3 (three) times daily with meals. 04/04/21   Little Ishikawa, MD  levothyroxine (SYNTHROID) 50 MCG tablet Take 50 mcg by mouth daily before breakfast. (0630)    [provider]  melatonin 5 MG TABS Take 5 mg by mouth at bedtime. (2100)    [provider]  Menthol, Topical Analgesic, (BIOFREEZE) 4 % GEL Apply 1 Application topically 3 (three) times daily as needed (pain).    [provider]  Multiple Vitamin (MULTIVITAMIN WITH MINERALS) TABS tablet Take 1 tablet by mouth daily. (0900)    [provider]  Multiple Vitamins-Minerals (DECUBI-VITE PO) Take 1 tablet by mouth in the morning. (0900)    [provider]  naphazoline-glycerin (CLEAR EYES REDNESS) 0.012-0.25 % SOLN Place 1-2 drops into the right eye 3 (three) times daily. 04/23/21   Alma Friendly, MD  neomycin-bacitracin-polymyxin (NEOSPORIN) OINT Apply 1 application topically 3 (three) times daily.  To right antecubital area 04/10/21   Bonnielee Haff, MD  Nutritional Supplements (,FEEDING SUPPLEMENT, PROSOURCE PLUS) liquid Take 30 mLs by mouth 2 (two) times daily between meals. Patient not taking: Reported on 09/10/2021 04/23/21   Alma Friendly, MD  Nutritional Supplements (FEEDING SUPPLEMENT, NEPRO CARB STEADY,) LIQD Take 237 mLs by mouth 3 (three) times daily before meals. Patient not taking: Reported on 09/10/2021 04/23/21   Alma Friendly, MD  Nutritional Supplements (NOVASOURCE RENAL) LIQD Take 237 mLs by mouth in the morning and at bedtime. (0800 & 1700)    [provider]  ondansetron (ZOFRAN) 4 MG tablet Take 1 tablet (4 mg total) by mouth daily as needed for nausea or vomiting. 04/11/21 04/11/22  Bonnielee Haff, MD  oxyCODONE (OXY IR/ROXICODONE) 5 MG immediate release tablet Take 1 tablet (5 mg total) by mouth every 4 (four) hours as needed (pain.). 09/12/21   Ulyses Amor, PA-C  pantoprazole (PROTONIX) 40 MG tablet Take 1 tablet (40 mg total) by mouth 2 (two) times daily. 04/10/21   Bonnielee Haff, MD  polyethylene glycol (MIRALAX / GLYCOLAX) 17 g packet Take 17 g by mouth daily. 05/01/21   Sherwood Gambler, MD  pravastatin (PRAVACHOL) 40 MG tablet Take 40 mg by mouth at bedtime. (2100)    [provider]  prednisoLONE (ORAPRED ODT) 10 MG disintegrating tablet Take 10 mg by mouth See admin instructions. Take 1 tablet (10 mg) by mouth for 2 days    [provider]  predniSONE (DELTASONE) 10 MG tablet Take 20-30 mg by mouth See admin instructions. Take 3 tablets (30 mg) by mouth daily in the evening for 2 days, then decrease to take 2 tablets (20 mg) by mouth in the evening for 2 days then discontinue.    [provider]  predniSONE (DELTASONE) 20 MG tablet Take 20 mg by mouth See admin instructions. Take 2 tablets (40 mg) by mouth in the evening for 2 days.    [provider]  tamsulosin (FLOMAX) 0.4 MG CAPS capsule Take 1 capsule (0.4 mg  total) by mouth daily. 04/11/21   Bonnielee Haff, MD     Vitals:   09/29/21 0800 09/29/21 0810 09/29/21 0830 09/29/21 0900  BP: (!) 157/53  (!) 153/58 138/65  Pulse: 88  84 68  Resp: '14  14 15  '$ Temp:      TempSrc:      SpO2: 94%  97% 95%  Weight:  80.1 kg    Height:       Exam Gen alert, no distress No rash, cyanosis or gangrene Sclera anicteric, throat clear  No jvd or bruits Chest clear bilat to bases, no rales/ wheezing RRR no MRG Abd soft ntnd no mass or ascites +bs GU normal male MS no joint effusions or deformity Ext trace LE edema, no wounds or ulcers Neuro is alert, Ox 3 , nf    RUE AVF+bruit      Home meds include - buspirone, clonidine 0.1 qd, hydrocodone-aceta prn, iportropium-albuterol prn, MVI, nepro carb steady, prn ondansetron, pantoprazole, pred taper, prns/ vits/ supps      CXR 8/6- IMPRESSION: Increasing bibasilar opacities, consistent with worsening airspace disease and/or effusion.   OP HD: MWF [facility]  4h  300/500  71kg  3K/2.5Ca bath  Heparin 3000  RUE AVF - last HD 8/4, post wt 77.3kg, may need ^edw to 73-74kg - last Hb 10.4 on 8/2 - mircera 75 ug last 7/12, q2 - iron sucrose '50mg'$  q wk   Assessment/ Plan: Atrial fib/ flutter w/ RVR - cards consulting, getting home cardizem, Mg and TSH pending.  CAP - getting IV abx, ^wbc and hypothermia on presentation. Looking better today.  Chronic resp failure - hx COPD, on home / SNF O2 at 2-3 Lpm [place].  ESRD - on HD MWF. Plan HD today.  HD access - had recent revision of the R arm AVF on 7/20.  CAD / hx PCI HTN/ vol - coming off 4-5 kg over at OP unit. CXR w/ bilat effusion w/o edema. May need dry wt raised. 4L UF  goal today w/ HD, will see how he tolerates. Continues w/ cardizem. BP's have been on the lower side here, home clonidine on hold.  Hx CVA Anemia esrd - Hb 9, due for esa will order darbe 60 ug weekly starting today.  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 Will ask about binders.       [name]  MD 09/29/2021, 9:22 AM Recent Labs  Lab 09/29/21 0311 09/29/21 0803  HGB 9.0* 8.9*  ALBUMIN  --  2.6*  CALCIUM 8.3* 8.3*  PHOS  --  9.0*  CREATININE 6.55* 6.86*  K 5.0 4.8

## 2021-09-29 NOTE — Procedures (Signed)
   I was present at this dialysis session, have reviewed the session itself and made  appropriate changes [name] MD [place] pager [phone]   09/29/2021, 9:41 AM

## 2021-09-29 NOTE — Progress Notes (Addendum)
Progress Note  Patient Name: Wayne Meadows Date of Encounter: 09/29/2021  [facility] Cardiologist: Sherren Mocha, MD   Subjective   Was aware of arrhythmia as soon as it started, then confirmed with his watch and pulse-ox Feels fine now  Inpatient Medications    Scheduled Meds:  aspirin EC  81 mg Oral Daily   Chlorhexidine Gluconate Cloth  6 each Topical Q0600   diltiazem  180 mg Oral Daily   guaiFENesin  600 mg Oral BID   heparin  5,000 Units Subcutaneous Q8H   ipratropium-albuterol  3 mL Inhalation QID   lanthanum  1,000 mg Oral TID WC   levothyroxine  50 mcg Oral QAC breakfast   melatonin  5 mg Oral QHS   mupirocin ointment  1 Application Nasal BID   pravastatin  40 mg Oral QHS   sodium chloride flush  3 mL Intravenous Q12H   tamsulosin  0.4 mg Oral Daily   Continuous Infusions:  anticoagulant sodium citrate     azithromycin (ZITHROMAX) 500 mg in sodium chloride 0.9 % 250 mL IVPB     cefTRIAXone (ROCEPHIN)  IV     PRN Meds: acetaminophen **OR** acetaminophen, albuterol, alteplase, anticoagulant sodium citrate, heparin, hydrALAZINE, hydrOXYzine, lidocaine (PF), lidocaine-prilocaine, ondansetron **OR** ondansetron (ZOFRAN) IV, oxyCODONE, pentafluoroprop-tetrafluoroeth   Vital Signs    Vitals:   09/28/21 2111 09/29/21 0358 09/29/21 0500 09/29/21 0723  BP:  (!) 162/87    Pulse: 86 87    Resp: 11 15    Temp:  97.9 F (36.6 C)    TempSrc:  Oral    SpO2: 99% 96%  94%  Weight:   80.6 kg   Height:   '6\' 2"'$  (1.88 m)     Intake/Output Summary (Last 24 hours) at 09/29/2021 0732 Last data filed at 09/29/2021 0518 Gross per 24 hour  Intake 460 ml  Output --  Net 460 ml      09/29/2021    5:00 AM 09/28/2021    2:23 PM 09/17/2021    8:07 PM  Last 3 Weights  Weight (lbs) 177 lb 11.1 oz 170 lb 145 lb 8.1 oz  Weight (kg) 80.6 kg 77.111 kg 66 kg      Telemetry    SR - Personally Reviewed  ECG    None today  - Personally Reviewed  Physical Exam   GEN: No  acute distress.   Neck: No JVD Cardiac: RRR, no murmurs, rubs, or gallops.  Respiratory: rales bases bilaterally. GI: Soft, nontender, non-distended  MS: No edema; No deformity. Neuro:  Nonfocal  Psych: Normal affect   Labs    High Sensitivity Troponin:  No results for input(s): "TROPONINIHS" in the last 720 hours.   Chemistry Recent Labs  Lab 09/28/21 1452 09/28/21 2222 09/29/21 0311  NA 138  --  139  K 5.5* 5.6* 5.0  CL 100  --  100  CO2 23  --  21*  GLUCOSE 93  --  145*  BUN 66*  --  75*  CREATININE 5.92*  --  6.55*  CALCIUM 9.0  --  8.3*  MG  --  1.5*  --   GFRNONAA 9*  --  8*  ANIONGAP 15  --  18*    Lipids No results for input(s): "CHOL", "TRIG", "HDL", "LABVLDL", "LDLCALC", "CHOLHDL" in the last 168 hours.  Hematology Recent Labs  Lab 09/28/21 1452 09/29/21 0311  WBC 13.6* 9.0  RBC 3.81* 3.05*  HGB 11.3* 9.0*  HCT 35.4* 28.3*  MCV 92.9 92.8  MCH 29.7 29.5  MCHC 31.9 31.8  RDW 17.0* 17.1*  PLT 262 206   Thyroid  Recent Labs  Lab 09/29/21 0311  TSH 28.658*   No results found for: "FREET4" No results found for: "T3FREE"  BNPNo results for input(s): "BNP", "PROBNP" in the last 168 hours.  DDimer No results for input(s): "DDIMER" in the last 168 hours.   Radiology    DG Chest Port 1 View  Result Date: 09/28/2021 CLINICAL DATA:  Shortness of breath, bladder infection EXAM: PORTABLE CHEST 1 VIEW COMPARISON:  09/17/2021 FINDINGS: Single frontal view of the chest demonstrates an unremarkable cardiac silhouette. There is increased retrocardiac consolidation and veiling opacity at the right lung base. Left costophrenic angle is excluded by collimation. No pneumothorax. No acute bony abnormalities. IMPRESSION: 1. Increasing bibasilar opacities, consistent with worsening airspace disease and/or effusion. Electronically Signed   By: Randa Ngo M.D.   On: 09/28/2021 15:03    Cardiac Studies   None this admit   ECHO: 03/08/2021  1. Left ventricular  ejection fraction, by estimation, is 65 to 70%. The  left ventricle has hyperdynamic function. The left ventricle has no  regional wall motion abnormalities. There is mild concentric left  ventricular hypertrophy. Left ventricular diastolic parameters are consistent with Grade I diastolic dysfunction (impaired relaxation).   2. Right ventricular systolic function is normal. The right ventricular  size is normal. There is mildly elevated pulmonary artery systolic  pressure.   3. Left atrial size was mildly dilated.   4. 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  can't rule out PFO.   5. Right atrial size was mildly dilated.   6. The mitral valve is normal in structure. No evidence of mitral valve  regurgitation.   7. The aortic valve was not well visualized. Aortic valve regurgitation  is mild to moderate.   8. The inferior vena cava is normal in size with greater than 50%  respiratory variability, suggesting right atrial pressure of 3 mmHg.   Comparison(s): No prior Echocardiogram.   Patient Profile     87 y.o. male male with a hx of ESRD, CAD, carotid disease, history of CVA, hypertension, paroxysmal atrial fibrillation, who was admitted 08/06 with atrial flutter >> Cards asked to see.   Assessment & Plan    Parox atrial flutter - s/p IV Cardizem w/ spont conversion to SR - now on home dose Cardizem CD 180 - no ectopy overnight - not anticoagulated dur to HAS-BLED 4, CHA2DS2-VASc =  5 - follow on telemetry  2. ESRD on HD - for HD today - got Lokelma for K+ 5.6  - per IM/Nephrology  3. Hypothyroid - PTA on  Synthroid 50 mcg qd - TSH is elevated, consider dose increase - per IM   For questions or updates, please contact [place] Please consult [email or web address] for contact info under    Signed, Rosaria Ferries, PA-C  09/29/2021, 7:32 AM   As above, patient seen and examined.  He denies dyspnea or chest pain.  He was seen in dialysis and was  noted to have intermittent atrial fibrillation with rapid ventricular response.  Note his most recent echocardiogram January 2023 showed normal LV function, grade 1 diastolic dysfunction, mild left atrial enlargement, small mass along the atrial septum suggestive of possible lipoma, mild right atrial enlargement, mild to moderate aortic insufficiency.  I will increase his Cardizem to 240 mg for better rate control.  Follow blood  pressure closely particular on dialysis days.  Issue of anticoagulation is somewhat complicated.  He has fallen previously but states to me only twice.  He is at risk for embolic event.  He will need close follow-up with his cardiologist as an outpatient as he may benefit from anticoagulation long-term.  Alternatively Watchman device could be considered.  Continue statin for history of coronary artery disease.  TSH is elevated.  Management per primary care.  Kirk Ruths, MD\

## 2021-09-30 ENCOUNTER — Ambulatory Visit: Payer: No Typology Code available for payment source

## 2021-09-30 ENCOUNTER — Inpatient Hospital Stay (HOSPITAL_COMMUNITY): Payer: No Typology Code available for payment source

## 2021-09-30 DIAGNOSIS — I4892 Unspecified atrial flutter: Secondary | ICD-10-CM

## 2021-09-30 DIAGNOSIS — R233 Spontaneous ecchymoses: Secondary | ICD-10-CM

## 2021-09-30 DIAGNOSIS — N186 End stage renal disease: Secondary | ICD-10-CM

## 2021-09-30 LAB — BASIC METABOLIC PANEL
Anion gap: 16 — ABNORMAL HIGH (ref 5–15)
BUN: 43 mg/dL — ABNORMAL HIGH (ref 8–23)
CO2: 27 mmol/L (ref 22–32)
Calcium: 8.7 mg/dL — ABNORMAL LOW (ref 8.9–10.3)
Chloride: 96 mmol/L — ABNORMAL LOW (ref 98–111)
Creatinine, Ser: 4.89 mg/dL — ABNORMAL HIGH (ref 0.61–1.24)
GFR, Estimated: 11 mL/min — ABNORMAL LOW (ref >60)
Glucose, Bld: 100 mg/dL — ABNORMAL HIGH (ref 70–99)
Potassium: 4.3 mmol/L (ref 3.5–5.1)
Sodium: 139 mmol/L (ref 135–145)

## 2021-09-30 LAB — CBC
HCT: 31.3 % — ABNORMAL LOW (ref 39.0–52.0)
Hemoglobin: 10 g/dL — ABNORMAL LOW (ref 13.0–17.0)
MCH: 29.1 pg (ref 26.0–34.0)
MCHC: 31.9 g/dL (ref 30.0–36.0)
MCV: 91 fL (ref 80.0–100.0)
Platelets: 208 10*3/uL (ref 150–400)
RBC: 3.44 MIL/uL — ABNORMAL LOW (ref 4.22–5.81)
RDW: 16.9 % — ABNORMAL HIGH (ref 11.5–15.5)
WBC: 8.8 10*3/uL (ref 4.0–10.5)
nRBC: 0 % (ref 0.0–0.2)

## 2021-09-30 LAB — ECHOCARDIOGRAM COMPLETE
AR max vel: 2.31 cm2
AV Peak grad: 14.8 mmHg
Ao pk vel: 1.93 m/s
Area-P 1/2: 5.66 cm2
Height: 74 in
S' Lateral: 3.74 cm
Weight: 2673.6 oz

## 2021-09-30 LAB — PHOSPHORUS: Phosphorus: 6.8 mg/dL — ABNORMAL HIGH (ref 2.5–4.6)

## 2021-09-30 LAB — MAGNESIUM: Magnesium: 1.6 mg/dL — ABNORMAL LOW (ref 1.7–2.4)

## 2021-09-30 LAB — HEPATITIS B SURFACE ANTIBODY, QUANTITATIVE: Hep B S AB Quant (Post): <3.1 m[IU]/mL — ABNORMAL LOW (ref >9.9)

## 2021-09-30 MED ORDER — METOPROLOL TARTRATE 25 MG PO TABS: 12.5000 mg | ORAL_TABLET | Freq: Two times a day (BID) | ORAL | Status: DC

## 2021-09-30 MED ORDER — CEFDINIR 300 MG PO CAPS: 300.0000 mg | ORAL_CAPSULE | ORAL | Status: DC

## 2021-09-30 MED ORDER — METOPROLOL TARTRATE 12.5 MG HALF TABLET
12.5000 mg | ORAL_TABLET | Freq: Two times a day (BID) | ORAL | Status: DC
Start: 2021-09-30 — End: 2021-10-01
  Administered 2021-09-30: 12.5 mg via ORAL
  Filled 2021-09-30: qty 1

## 2021-09-30 MED ORDER — DILTIAZEM HCL ER COATED BEADS 240 MG PO CP24: 240.0000 mg | ORAL_CAPSULE | Freq: Every day | ORAL | Status: DC

## 2021-09-30 MED ORDER — DARBEPOETIN ALFA 60 MCG/0.3ML IJ SOSY: 60.0000 ug | PREFILLED_SYRINGE | INTRAMUSCULAR | Status: AC

## 2021-09-30 MED ORDER — CEFDINIR 300 MG PO CAPS
300.0000 mg | ORAL_CAPSULE | Freq: Once | ORAL | Status: AC
  Administered 2021-09-30: 300 mg via ORAL
  Filled 2021-09-30: qty 1

## 2021-09-30 MED ORDER — CEFDINIR 300 MG PO CAPS: 300.0000 mg | ORAL_CAPSULE | Freq: Two times a day (BID) | ORAL | Status: DC

## 2021-09-30 MED ORDER — AZITHROMYCIN 250 MG PO TABS
500.0000 mg | ORAL_TABLET | Freq: Once | ORAL | Status: AC
  Administered 2021-09-30: 500 mg via ORAL
  Filled 2021-09-30: qty 2

## 2021-09-30 MED ORDER — OXYCODONE HCL 5 MG PO TABS: 5.0000 mg | ORAL_TABLET | Freq: Four times a day (QID) | ORAL | 0 refills | Status: DC | PRN

## 2021-09-30 NOTE — Progress Notes (Signed)
D/C order noted. Pt to d/c to snf today. Contacted [facility] to advise clinic of pt's dc today and that pt will resume care tomorrow.   Melven Sartorius Renal Navigator [phone]

## 2021-09-30 NOTE — TOC Transition Note (Addendum)
Transition of Care [facility]) - CM/SW Discharge Note   Patient Details  Name: Wayne Meadows MRN: [id number] Date of Birth: 06-Oct-1934  Transition of [facility] CM/SW Contact:  Milas Gain, Clifford Phone Number: 09/30/2021, 1:58 PM   Clinical Narrative:     Patient will DC to: [facility]  Anticipated DC date: 09/30/2021  Family notified: Tammy  Transport by: Corey Harold  ?  Per MD patient ready for DC to [facility] . RN, patient, renal navigator,patient's family, and facility notified of DC. Discharge Summary sent to facility. RN given number for report tele# [phone]. Per facility no FL2 needed. Janie with [facility] confirmed patient can come back under medicaid and if patient wants rehab when he returns can receive under medicare part B benefits. DC packet on chart. Ambulance transport requested for patient.  CSW signing off.    Final next level of care: Skilled Nursing Facility Barriers to Discharge: No Barriers Identified   Patient Goals and CMS Choice Patient states their goals for this hospitalization and ongoing recovery are:: SNF CMS Medicare.gov Compare Post Acute Care list provided to:: Patient Choice offered to / list presented to : Patient  Discharge Placement              Patient chooses bed at: [facility] Patient to be transferred to facility by: [place] Name of family member notified: Tammy Patient and family notified of of transfer: 09/30/21  Discharge Plan and Services In-house Referral: Clinical Social Work                                   Social Determinants of Health ([place]) Interventions     Readmission Risk Interventions    03/21/2021    1:21 PM 03/19/2021    1:23 PM  Readmission Risk Prevention Plan  Transportation Screening Complete   HRI or Home Care Consult Complete   SW Recovery Care/Counseling Consult Complete Complete  Skilled Nursing Facility Complete Complete

## 2021-09-30 NOTE — Progress Notes (Signed)
Progress Note  Patient Name: Wayne Meadows Date of Encounter: 09/30/2021  [facility] Cardiologist: Sherren Mocha, MD   Subjective   No CP or dyspnea  Inpatient Medications    Scheduled Meds:  aspirin EC  81 mg Oral Daily   Chlorhexidine Gluconate Cloth  6 each Topical Q0600   darbepoetin (ARANESP) injection - DIALYSIS  60 mcg Intravenous Q Mon-HD   diltiazem  240 mg Oral Daily   guaiFENesin  600 mg Oral BID   heparin  5,000 Units Subcutaneous Q8H   ipratropium-albuterol  3 mL Inhalation TID   lanthanum  1,000 mg Oral TID WC   levothyroxine  50 mcg Oral QAC breakfast   melatonin  5 mg Oral QHS   mupirocin ointment  1 Application Nasal BID   pravastatin  40 mg Oral QHS   sodium chloride flush  3 mL Intravenous Q12H   tamsulosin  0.4 mg Oral Daily   Continuous Infusions:  azithromycin (ZITHROMAX) 500 mg in sodium chloride 0.9 % 250 mL IVPB Stopped (09/29/21 1902)   cefTRIAXone (ROCEPHIN)  IV Stopped (09/29/21 1713)   PRN Meds: acetaminophen **OR** [DISCONTINUED] acetaminophen, hydrALAZINE, hydrOXYzine, ondansetron **OR** ondansetron (ZOFRAN) IV, oxyCODONE   Vital Signs    Vitals:   09/29/21 1800 09/29/21 1950 09/30/21 0114 09/30/21 0358  BP:  (!) 123/49 (!) 153/48 (!) 167/56  Pulse: 71 89 87 90  Resp:  '20 16 16  '$ Temp:  98.9 F (37.2 C) 98.5 F (36.9 C) 98.1 F (36.7 C)  TempSrc:  Oral Oral Oral  SpO2: 96% 96% 94% 93%  Weight:    75.8 kg  Height:        Intake/Output Summary (Last 24 hours) at 09/30/2021 0715 Last data filed at 09/30/2021 0604 Gross per 24 hour  Intake 1070 ml  Output 4000 ml  Net -2930 ml      09/30/2021    3:58 AM 09/29/2021    8:10 AM 09/29/2021    5:00 AM  Last 3 Weights  Weight (lbs) 167 lb 1.6 oz 176 lb 9.4 oz 177 lb 11.1 oz  Weight (kg) 75.796 kg 80.1 kg 80.6 kg      Telemetry    Sinus - Personally Reviewed  Physical Exam   GEN: No acute distress.   Neck: No JVD Cardiac: RRR, no murmurs, rubs, or gallops.  Respiratory:  Clear to auscultation bilaterally. GI: Soft, nontender, non-distended  MS: No edema Neuro:  Nonfocal  Psych: Normal affect   Labs     Chemistry Recent Labs  Lab 09/28/21 2222 09/29/21 0311 09/29/21 0803 09/30/21 0402  NA  --  139 138 139  K 5.6* 5.0 4.8 4.3  CL  --  100 102 96*  CO2  --  21* 21* 27  GLUCOSE  --  145* 80 100*  BUN  --  75* 75* 43*  CREATININE  --  6.55* 6.86* 4.89*  CALCIUM  --  8.3* 8.3* 8.7*  MG 1.5*  --   --  1.6*  ALBUMIN  --   --  2.6*  --   GFRNONAA  --  8* 7* 11*  ANIONGAP  --  18* 15 16*     Hematology Recent Labs  Lab 09/29/21 0311 09/29/21 0803 09/30/21 0402  WBC 9.0 9.7 8.8  RBC 3.05* 3.06* 3.44*  HGB 9.0* 8.9* 10.0*  HCT 28.3* 27.7* 31.3*  MCV 92.8 90.5 91.0  MCH 29.5 29.1 29.1  MCHC 31.8 32.1 31.9  RDW 17.1* 17.0* 16.9*  PLT 206 210 208   Thyroid  Recent Labs  Lab 09/29/21 0311  TSH 28.658*     Radiology    DG Chest Port 1 View  Result Date: 09/28/2021 CLINICAL DATA:  Shortness of breath, bladder infection EXAM: PORTABLE CHEST 1 VIEW COMPARISON:  09/17/2021 FINDINGS: Single frontal view of the chest demonstrates an unremarkable cardiac silhouette. There is increased retrocardiac consolidation and veiling opacity at the right lung base. Left costophrenic angle is excluded by collimation. No pneumothorax. No acute bony abnormalities. IMPRESSION: 1. Increasing bibasilar opacities, consistent with worsening airspace disease and/or effusion. Electronically Signed   By: Randa Ngo M.D.   On: 09/28/2021 15:03     Patient Profile     87 y.o. male male with a hx of ESRD, CAD, carotid disease, history of CVA, hypertension, paroxysmal atrial fibrillation, who was admitted 08/06 with atrial flutter >> Cards asked to see.  Last echocardiogram January 2023 showed normal LV function, mild left ventricular hypertrophy, grade 1 diastolic dysfunction, mild left atrial enlargement, small mass on the atrial septum in the right atrium  consistent with possible lipoma, mild left atrial enlargement, mild to moderate aortic insufficiency.  Assessment & Plan    1 paroxysmal atrial fibrillation/flutter-initially documented February 2023.  I have reviewed the patient's admission ECGs.  Initial arrhythmia may have been SVT.  I have increased his Cardizem to 240 mg daily and he remains in sinus rhythm.  We will continue.  Issue of anticoagulation is somewhat difficult.  His CHA2DS2-VASc is 5 and he is at risk for embolic event.  However he has fallen most recently with rib and pelvis injuries.  He also had GI bleed in February.  We will hold for now but should be readdressed at office follow-up.  Alternatively could consider watchman.  2 question lipoma on previous echocardiogram-We will repeat study.  3 coronary artery disease-plan to continue medical therapy with aspirin and statin.  He is not having chest pain.  4 end-stage renal disease-Per nephrology.  5 elevated TSH-Synthroid adjustment per primary service.  For questions or updates, please contact [place] Please consult [email or web address] for contact info under        Signed, Kirk Ruths, MD  09/30/2021, 7:15 AM

## 2021-09-30 NOTE — Progress Notes (Addendum)
[facility] Progress Note   Subjective: Completed dialysis yesterday. Net UF 2.9L Feels much better today. Back in SR. Plans for discharge.   Objective Vitals:   09/30/21 0925 09/30/21 1053 09/30/21 1055 09/30/21 1229  BP: (!) 172/60 (!) 186/59  (!) 156/59  Pulse: 96 89  88  Resp: '16 16  16  '$ Temp: 98.3 F (36.8 C)     TempSrc: Oral     SpO2: 90% 95% 98% 99%  Weight:      Height:         Additional Objective Labs: Basic Metabolic Panel: Recent Labs  Lab 09/29/21 0311 09/29/21 0803 09/30/21 0402  NA 139 138 139  K 5.0 4.8 4.3  CL 100 102 96*  CO2 21* 21* 27  GLUCOSE 145* 80 100*  BUN 75* 75* 43*  CREATININE 6.55* 6.86* 4.89*  CALCIUM 8.3* 8.3* 8.7*  PHOS  --  9.0* 6.8*   CBC: Recent Labs  Lab 09/28/21 1452 09/29/21 0311 09/29/21 0803 09/30/21 0402  WBC 13.6* 9.0 9.7 8.8  NEUTROABS 10.6*  --   --   --   HGB 11.3* 9.0* 8.9* 10.0*  HCT 35.4* 28.3* 27.7* 31.3*  MCV 92.9 92.8 90.5 91.0  PLT 262 206 210 208   Blood Culture    Component Value Date/Time   SDES BLOOD LEFT FOREARM 08/14/2021 0449   SPECREQUEST  08/14/2021 0449    BOTTLES DRAWN AEROBIC AND ANAEROBIC Blood Culture results may not be optimal due to an inadequate volume of blood received in culture bottles   CULT (A) 08/14/2021 0449    DIPHTHEROIDS(CORYNEBACTERIUM SPECIES) Standardized susceptibility testing for this organism is not available. Performed at [facility], [place] [address], [place], [place] [address]    REPTSTATUS 08/19/2021 FINAL 08/14/2021 0449     Physical Exam General: Alert,nad  Heart: RRR  Lungs: Clear, bilaterally  Abdomen: soft  Extremities: No LE edema  Dialysis Access: R AVF +bruit   Medications:   aspirin EC  81 mg Oral Daily   azithromycin  500 mg Oral Once   cefdinir  300 mg Oral Once   [START ON 10/01/2021] cefdinir  300 mg Oral Q24H   Chlorhexidine Gluconate Cloth  6 each Topical Q0600   darbepoetin (ARANESP) injection - DIALYSIS  60 mcg  Intravenous Q Mon-HD   diltiazem  240 mg Oral Daily   guaiFENesin  600 mg Oral BID   heparin  5,000 Units Subcutaneous Q8H   ipratropium-albuterol  3 mL Inhalation TID   lanthanum  1,000 mg Oral TID WC   levothyroxine  50 mcg Oral QAC breakfast   melatonin  5 mg Oral QHS   metoprolol tartrate  12.5 mg Oral BID   mupirocin ointment  1 Application Nasal BID   pravastatin  40 mg Oral QHS   sodium chloride flush  3 mL Intravenous Q12H   tamsulosin  0.4 mg Oral Daily    OP HD: MWF [facility]  4h  300/500  71kg  3K/2.5Ca bath  Heparin 3000  RUE AVF - last HD 8/4, post wt 77.3kg, may need ^edw to 73-74kg - last Hb 10.4 on 8/2 - mircera 75 ug last 7/12, q2 - iron sucrose '50mg'$  q wk     Assessment/ Plan: Atrial fib/ flutter w/ RVR - cards consulting. Cardizem increased.  CAP - got IV abx, ^wbc and hypothermia on presentation. Looking better today. Continue PO antibiotics.  Chronic resp failure - hx COPD, on home / SNF O2 at  2-3 Lpm [place].  ESRD - on HD MWF. Next HD 8/9.  HD access - had recent revision of the R arm AVF on 7/20.  CAD / hx PCI HTN/ vol - coming off 4-5 kg over at OP unit. CXR w/ bilat effusion w/o edema. Continues w/ cardizem. BP's have been on the lower side here, home clonidine on hold.  Tolerated 2.9L UF. Follow weights as outpatient.  Hx CVA Anemia esrd - Hb 9, due for esa will order darbe 60 ug weekly starting today.  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 Continue binders as outpatient.   Lynnda Child PA-C [facility] 09/30/2021,12:36 PM

## 2021-09-30 NOTE — Progress Notes (Signed)
PHARMACY NOTE:  ANTIMICROBIAL RENAL DOSAGE ADJUSTMENT  Current antimicrobial regimen includes a mismatch between antimicrobial dosage and estimated renal function.  As per policy approved by the Pharmacy & Therapeutics and Medical Executive Committees, the antimicrobial dosage will be adjusted accordingly.  Current antimicrobial dosage:  omnicef '300mg'$  BID x4 doses  Indication: CAP  Renal Function:  Estimated Creatinine Clearance: 12.1 mL/min (A) (by C-G formula based on SCr of 4.89 mg/dL (H)). '[x]'$      On intermittent HD, scheduled: '[]'$      On CRRT    Antimicrobial dosage has been changed to:  Omnicef '300mg'$  x1 now then '300mg'$  after HD tomorrow  Additional comments:   Thank you for allowing pharmacy to be a part of this patient's care.  Einar Grad, [facility] 09/30/2021 12:17 PM

## 2021-09-30 NOTE — Evaluation (Signed)
Physical Therapy Evaluation Patient Details Name: Wayne Meadows MRN: [id number] DOB: Sep 11, 1934 Today's Date: 09/30/2021  History of Present Illness  87 y/o male presented to ED from [facility] on 09/28/21 with SOB and increased respiratory effort, found to be in atrial flutter. Currently being treated for bladder infection.  Recent presentation to ED with fall out of w/c and L hip pain and probable L hip superior pubic ramus fx.  PMH: ESRD, CAD, COPD, HTN, CVA, renal carcinoma  Clinical Impression  Pt admitted with above diagnosis. Pt frustrated by need for O2 because he said he was able to go without it all day Sunday and then became SOB that evening. He reports he is working towards leaving SNF and returning home and keeps having setbacks. Pt able to come to EOB with increased time. Still has some L hip pain but is able to ambulate in room with RW with min A. HR to 103 bpm, SPO2 dropped to 88% on 2L O2 with recovery to 92% with 3 mins seated rest.  Pt currently with functional limitations due to the deficits listed below (see PT Problem List). Pt will benefit from skilled PT to increase their independence and safety with mobility to allow discharge to the venue listed below.          Recommendations for follow up therapy are one component of a multi-disciplinary discharge planning process, led by the attending physician.  Recommendations may be updated based on patient status, additional functional criteria and insurance authorization.  Follow Up Recommendations Skilled nursing-short term rehab (<3 hours/day) Can patient physically be transported by private vehicle: Yes    Assistance Recommended at Discharge Frequent or constant Supervision/Assistance  Patient can return home with the following  A little help with walking and/or transfers;A little help with bathing/dressing/bathroom;Assistance with cooking/housework;Assist for transportation;Help with stairs or ramp for entrance     Equipment Recommendations None recommended by PT  Recommendations for Other Services       Functional Status Assessment Patient has had a recent decline in their functional status and demonstrates the ability to make significant improvements in function in a reasonable and predictable amount of time.     Precautions / Restrictions Precautions Precautions: Fall Precaution Comments: has had several falls Restrictions Weight Bearing Restrictions: No      Mobility  Bed Mobility Overal bed mobility: Needs Assistance Bed Mobility: Supine to Sit     Supine to sit: Min guard     General bed mobility comments: pt able to come to EOB without physical assist    Transfers Overall transfer level: Needs assistance Equipment used: Rolling walker (2 wheels) Transfers: Sit to/from Stand Sit to Stand: Min guard           General transfer comment: increased time needed and close guarding but pt able to rise safely to RW    Ambulation/Gait Ambulation/Gait assistance: Min assist Gait Distance (Feet): 20 Feet Assistive device: Rolling walker (2 wheels) Gait Pattern/deviations: Step-through pattern, Antalgic Gait velocity: decreased Gait velocity interpretation: <1.8 ft/sec, indicate of risk for recurrent falls   General Gait Details: HR to 103 bpm, SPO2 dropped to 88% on 2 L O2. recovered to 92% with rest and HR 99 bpm.  Stairs            Wheelchair Mobility    Modified Rankin (Stroke Patients Only)       Balance Overall balance assessment: History of Falls, Needs assistance Sitting-balance support: No upper extremity supported, Feet supported Sitting balance-Leahy  Scale: Good     Standing balance support: Bilateral upper extremity supported, During functional activity Standing balance-Leahy Scale: Poor Standing balance comment: reliant on UE support for safety                             Pertinent Vitals/Pain Pain Assessment Pain Assessment:  Faces Faces Pain Scale: Hurts little more Pain Location: L hip, R hip, back Pain Descriptors / Indicators: Aching, Sore Pain Intervention(s): Limited activity within patient's tolerance, Monitored during session    Home Living Family/patient expects to be discharged to:: Skilled nursing facility                   Additional Comments: Pt plans to return to [facility] though hoping to get back home soon    Prior Function Prior Level of Function : Needs assist       Physical Assist : Mobility (physical)     Mobility Comments: pt reports that he was able to progress his ambulation to utilizing RW for long distance ambulation and independent with out AD to go from bed to bathroom       Hand Dominance   Dominant Hand: Right    Extremity/Trunk Assessment   Upper Extremity Assessment Upper Extremity Assessment: Overall WFL for tasks assessed    Lower Extremity Assessment Lower Extremity Assessment: LLE deficits/detail LLE Deficits / Details: still recovering from pelvic fx, able to lift LLE against grvaity LLE Sensation: WNL LLE Coordination: WNL    Cervical / Trunk Assessment Cervical / Trunk Assessment: Kyphotic  Communication   Communication: HOH  Cognition Arousal/Alertness: Awake/alert Behavior During Therapy: WFL for tasks assessed/performed Overall Cognitive Status: Within Functional Limits for tasks assessed                                          General Comments      Exercises     Assessment/Plan    PT Assessment Patient needs continued PT services  PT Problem List Decreased strength;Decreased activity tolerance;Decreased balance;Decreased mobility;Pain;Cardiopulmonary status limiting activity       PT Treatment Interventions DME instruction;Gait training;Functional mobility training;Therapeutic activities;Therapeutic exercise;Balance training;Patient/family education    PT Goals (Current goals can be found in the Care Plan  section)  Acute Rehab PT Goals Patient Stated Goal: return to [facility] but ultimately return home PT Goal Formulation: With patient Time For Goal Achievement: 10/14/21 Potential to Achieve Goals: Good    Frequency Min 2X/week     Co-evaluation               AM-PAC PT "6 Clicks" Mobility  Outcome Measure Help needed turning from your back to your side while in a flat bed without using bedrails?: None Help needed moving from lying on your back to sitting on the side of a flat bed without using bedrails?: None Help needed moving to and from a bed to a chair (including a wheelchair)?: None Help needed standing up from a chair using your arms (e.g., wheelchair or bedside chair)?: A Little Help needed to walk in hospital room?: A Little Help needed climbing 3-5 steps with a railing? : A Lot 6 Click Score: 20    End of Session Equipment Utilized During Treatment: Gait belt;Oxygen Activity Tolerance: Patient tolerated treatment well Patient left: in chair;with call bell/phone within reach Nurse Communication: Mobility status PT Visit  Diagnosis: Unsteadiness on feet (R26.81);Muscle weakness (generalized) (M62.81);Difficulty in walking, not elsewhere classified (R26.2);Pain Pain - Right/Left: Left Pain - part of body: Hip    Time: 9692-4932 PT Time Calculation (min) (ACUTE ONLY): 16 min   Charges:   PT Evaluation $PT Eval Moderate Complexity: [place], PT  Acute Rehab Services Secure chat preferred Office [place] 09/30/2021, 11:22 AM

## 2021-09-30 NOTE — Discharge Summary (Signed)
DISCHARGE SUMMARY  Wayne Meadows  MR#: [id number]  DOB:[birth year]  Date of Admission: 09/28/2021 Date of Discharge: 09/30/2021  Attending Physician:Sharlisa Hollifield Hennie Duos, MD  Patient's AOZ:HYQMVH, [name]  Consults: [facility] Cardiolgoy  Nephrology  Vascular Surgery   Disposition: D/C to SNF   Follow-up Appts:  Follow-up Information     Imogene Burn, PA-C Follow up.   Specialty: Cardiology Why: [place] location - a cardiology follow-up has been arranged for you with Ermalinda Barrios, PA-C on Wednesday Oct 08, 2021 at 10:45 AM (Arrive by 10:30 AM). Contact information: [address] [place] [address] [phone]                 Tests Needing Follow-up: -ongoing PT required in SNF rehab setting -follow HR control -follow BP with probable need to further adjust medical tx   Discharge Diagnoses: SVT v/s Acute atrial flutter with RVR History of remote atrial fibrillation -  Hyperkalemia Hypomagnesemia Possible Pneumonia ESRD on HD MWF  COPD w/ Chronic hypoxic respiratory failure CAD S/p BMS to RCA in 2012 Hypothyroidism Hypertension History of ischemic thrombotic CVA   Initial presentation: 87 year old with a history of ESRD on HD MWF, CAD status post BMS to RCA 2012, COPD requiring chronic 3 L O2 support, PAF not on anticoagulation due to hemorrhagic renal cyst and UGIB, CVA, hypothyroidism, HTN, HLD, and anemia of CKD who was sent to the ER from his SNF with reported SOB.  He reported feeling dyspneic immediately upon wakening, at which time he also noted his heart rate was in the 140s.    Hospital Course:  History of remote atrial fibrillation - acute atrial flutter with RVR History of post cath PAF 2012 w/ no known recurrence - in aflutter v/s SVT at presentation, and has since converted to sinus rhythm - not anticoagulated due to HAS-BLED 4, CHA2DS2-VASc 5 , but consideration can be given to initiating  anticoagulation and outpatient follow-up with his cardiologist if he remains stable -Cardizem dose has been increased during this hospital stay and he has tolerated it well - Cards recommendations were as follows: nitial arrhythmia may have been SVT.  I have increased his Cardizem to 240 mg daily and he remains in sinus rhythm.  We will continue.  Issue of anticoagulation is somewhat difficult.  His CHA2DS2-VASc is 5 and he is at risk for embolic event.  However he has fallen most recently with rib and pelvis injuries.  He also had GI bleed in February.  We will hold for now but should be readdressed at office follow-up.  Alternatively could consider watchman.  Question lipoma on previous echocardiogram F/u TTE this admit w/o acute findings    Hyperkalemia Corrected with dialysis   Hypomagnesemia Carefully supplemented magnesium in setting of atrial arrhythmia with concurrent ESRD   Possible Pneumonia bibasilar opacities seen on CXR - leukocytosis noted but afebrile - O2 stable on home 2-3 L O2 via [place] - to complete 3 days of empiric antibiotic therapy but bacterial pneumonia not felt to be highly likely   ESRD on HD MWF  Nephrology followed during this admit - underwent recent I&D to his RUE AV fistula on 7/20 - Vasc Surgery evaluated surgical site during this admit, and removed sutures from site    COPD w/ Chronic hypoxic respiratory failure Stable on home 2-3 L O2 via [place] - no change in O2 prescription    CAD S/p BMS to RCA in 2012 Stable, denies chest  pain - continue aspirin and statin - evaluated by Cardiolgoy this admit    Hypothyroidism Continue Synthroid   Hypertension Blood pressure poorly controlled - meds adjusted during this admit - further titration of meds likely to be required as outpt    History of ischemic thrombotic CVA  Continue aspirin and statin  Allergies as of 09/30/2021       Reactions   Bee Venom Anaphylaxis   Has epi pen   Influenza Vaccines Other (See  Comments)   "Mortally sick for 2 weeks"        Medication List     STOP taking these medications    albuterol 108 (90 Base) MCG/ACT inhaler Commonly known as: VENTOLIN HFA   cloNIDine 0.1 MG tablet Commonly known as: CATAPRES   docusate sodium 100 MG capsule Commonly known as: COLACE   guaiFENesin 300 MG/15ML Liqd   hydrALAZINE 10 MG tablet Commonly known as: APRESOLINE   HYDROcodone-acetaminophen 5-325 MG tablet Commonly known as: NORCO/VICODIN   prednisoLONE 10 MG disintegrating tablet Commonly known as: ORAPRED ODT   predniSONE 10 MG tablet Commonly known as: DELTASONE   predniSONE 20 MG tablet Commonly known as: DELTASONE       TAKE these medications    acetaminophen 325 MG tablet Commonly known as: TYLENOL Take 2 tablets (650 mg total) by mouth every 6 (six) hours as needed for mild pain (or Fever >/= 101).   aspirin EC 81 MG tablet Take 81 mg by mouth daily.  (0900)   Biofreeze 4 % Gel Generic drug: Menthol (Topical Analgesic) Apply 1 Application topically 3 (three) times daily as needed (pain).   bisacodyl 10 MG suppository Commonly known as: DULCOLAX Place 10 mg rectally daily as needed for moderate constipation.   busPIRone 5 MG tablet Commonly known as: BUSPAR Take 5 mg by mouth 2 (two) times daily. (0800 & 2000)   camphor-menthol lotion Commonly known as: SARNA Apply topically as needed for itching. What changed: how much to take   cefdinir 300 MG capsule Commonly known as: OMNICEF Take 1 capsule (300 mg total) by mouth daily. Start taking on: October 01, 2021   Darbepoetin Alfa 60 MCG/0.3ML Sosy injection Commonly known as: ARANESP Inject 0.3 mLs (60 mcg total) into the vein every Monday with hemodialysis.   DECUBI-VITE PO Take 1 tablet by mouth in the morning. (0900)   diltiazem 240 MG 24 hr capsule Commonly known as: CARDIZEM CD Take 1 capsule (240 mg total) by mouth daily. Start taking on: October 01, 2021 What changed:   medication strength how much to take   feeding supplement (PRO-STAT SUGAR FREE 64) Liqd Take 30 mLs by mouth every evening. (1700)   ferrous sulfate 325 (65 FE) MG tablet Take 1 tablet by mouth every Monday, Wednesday, and Friday. (0900)   hydrOXYzine 10 MG tablet Commonly known as: ATARAX Take 10 mg by mouth 3 (three) times daily as needed for itching.   ipratropium-albuterol 0.5-2.5 (3) MG/3ML Soln Commonly known as: DUONEB Take 3 mLs by nebulization every 4 (four) hours as needed (shortness of breath/wheezing).   Ipratropium-Albuterol 20-100 MCG/ACT Aers respimat Commonly known as: COMBIVENT Inhale 1 puff into the lungs 4 (four) times daily. (0900, 1300, 1700 & 2100)   lanthanum 1000 MG chewable tablet Commonly known as: FOSRENOL Chew 1 tablet (1,000 mg total) by mouth 3 (three) times daily with meals.   levothyroxine 50 MCG tablet Commonly known as: SYNTHROID Take 50 mcg by mouth daily before breakfast. (0630)  melatonin 5 MG Tabs Take 5 mg by mouth at bedtime. (2100)   metoprolol tartrate 25 MG tablet Commonly known as: LOPRESSOR Take 0.5 tablets (12.5 mg total) by mouth 2 (two) times daily.   multivitamin with minerals Tabs tablet Take 1 tablet by mouth daily. (0900)   naphazoline-glycerin 0.012-0.25 % Soln Commonly known as: CLEAR EYES REDNESS Place 1-2 drops into the right eye 3 (three) times daily.   neomycin-bacitracin-polymyxin Oint Commonly known as: NEOSPORIN Apply 1 application topically 3 (three) times daily. To right antecubital area   N[employer] Renal Liqd Take 237 mLs by mouth in the morning and at bedtime. (0800 & 1700) What changed: Another medication with the same name was removed. Continue taking this medication, and follow the directions you see here.   ondansetron 4 MG tablet Commonly known as: Zofran Take 1 tablet (4 mg total) by mouth daily as needed for nausea or vomiting.   oxyCODONE 5 MG immediate release tablet Commonly known  as: Oxy IR/ROXICODONE Take 1 tablet (5 mg total) by mouth every 6 (six) hours as needed for severe pain. What changed:  when to take this reasons to take this   pantoprazole 40 MG tablet Commonly known as: PROTONIX Take 1 tablet (40 mg total) by mouth 2 (two) times daily.   polyethylene glycol 17 g packet Commonly known as: MIRALAX / GLYCOLAX Take 17 g by mouth daily.   pravastatin 40 MG tablet Commonly known as: PRAVACHOL Take 40 mg by mouth at bedtime. (2100)   tamsulosin 0.4 MG Caps capsule Commonly known as: FLOMAX Take 1 capsule (0.4 mg total) by mouth daily.        Day of Discharge BP (!) 186/59 (BP Location: Left Arm)   Pulse 89   Temp 98.3 F (36.8 C) (Oral)   Resp 16   Ht '6\' 2"'$  (1.88 m)   Wt 75.8 kg   SpO2 98%   BMI 21.45 kg/m   Physical Exam: General: No acute respiratory distress Lungs: Clear to auscultation bilaterally without wheezes or crackles Cardiovascular: Regular rate and rhythm without murmur gallop or rub normal S1 and S2 Abdomen: Nontender, nondistended, soft, bowel sounds positive, no rebound, no ascites, no appreciable mass Extremities: No significant cyanosis, clubbing, or edema bilateral lower extremities  Basic Metabolic Panel: Recent Labs  Lab 09/28/21 1452 09/28/21 2222 09/29/21 0311 09/29/21 0803 09/30/21 0402  NA 138  --  139 138 139  K 5.5* 5.6* 5.0 4.8 4.3  CL 100  --  100 102 96*  CO2 23  --  21* 21* 27  GLUCOSE 93  --  145* 80 100*  BUN 66*  --  75* 75* 43*  CREATININE 5.92*  --  6.55* 6.86* 4.89*  CALCIUM 9.0  --  8.3* 8.3* 8.7*  MG  --  1.5*  --   --  1.6*  PHOS  --   --   --  9.0* 6.8*   CBC: Recent Labs  Lab 09/28/21 1452 09/29/21 0311 09/29/21 0803 09/30/21 0402  WBC 13.6* 9.0 9.7 8.8  NEUTROABS 10.6*  --   --   --   HGB 11.3* 9.0* 8.9* 10.0*  HCT 35.4* 28.3* 27.7* 31.3*  MCV 92.9 92.8 90.5 91.0  PLT 262 206 210 208    Time spent in discharge (includes decision making & examination of pt): 35  minutes  09/30/2021, 12:22 PM   Cherene Altes, MD [facility] Office  [phone]

## 2021-10-01 DIAGNOSIS — J9611 Chronic respiratory failure with hypoxia: Secondary | ICD-10-CM | POA: Diagnosis not present

## 2021-10-01 DIAGNOSIS — J449 Chronic obstructive pulmonary disease, unspecified: Secondary | ICD-10-CM | POA: Diagnosis not present

## 2021-10-01 DIAGNOSIS — M6281 Muscle weakness (generalized): Secondary | ICD-10-CM | POA: Diagnosis not present

## 2021-10-01 DIAGNOSIS — R293 Abnormal posture: Secondary | ICD-10-CM | POA: Diagnosis not present

## 2021-10-01 DIAGNOSIS — R278 Other lack of coordination: Secondary | ICD-10-CM | POA: Diagnosis not present

## 2021-10-01 DIAGNOSIS — R2681 Unsteadiness on feet: Secondary | ICD-10-CM | POA: Diagnosis not present

## 2021-10-01 DIAGNOSIS — R262 Difficulty in walking, not elsewhere classified: Secondary | ICD-10-CM | POA: Diagnosis not present

## 2021-10-01 NOTE — Progress Notes (Signed)
Cardiology Office Note    Date:  10/08/2021   ID:  Wayne Meadows, DOB [birth year], MRN [id number]   PCP:  Clinic, [facility]  Cardiologist:  Sherren Mocha, MD   Advanced Practice Provider:  No care team member to display Electrophysiologist:  None   [id number]   Chief Complaint  Patient presents with   Hospitalization Follow-up    History of Present Illness:  Wayne Meadows is a 87 y.o. male  with a hx of ESRD, CAD, carotid disease, history of CVA, hypertension, paroxysmal atrial fibrillation,   Patient was admitted 08/06 with atrial flutter >> Cards asked to see.  Last echocardiogram January 2023 showed normal LV function, mild left ventricular hypertrophy, grade 1 diastolic dysfunction, mild left atrial enlargement, small mass on the atrial septum in the right atrium consistent with possible lipoma, mild left atrial enlargement, mild to moderate aortic insufficiency. Dr. Stanford Breed reviewed all EKG's and felt he may have had SVT. CHADSvasc=5 but recent fall with rib and pelvis injuries and GI bleed in Feb. Held off anticoagulation until OP f/u and consider Watchman. Repeat echo 09/30/21 doesn't mention lipoma.  Patient comes in for f/u. Doing rehab at [facility]. No chest pain, palpitations, dyspnea since hospital. He checks his pulse ox 3 times a day and it's been normal. Now on O2. Has dialysis after this appt.    Past Medical History:  Diagnosis Date   Abnormal echocardiogram 03/08/2021   Abnormal finding on GI tract imaging    Acute blood loss anemia    Acute pancreatitis 08/12/2020   Arthritis    Carotid stenosis    Community acquired pneumonia 04/06/2021   COPD (chronic obstructive pulmonary disease) (HCC)    Coronary artery disease    S/p PCI 2011;  NSTEMI 12/12:  LHC/PCI 02/23/11: LAD 60% after the septal perforator, D1 occluded with distal collaterals, proximal RI 30-40%, AV circumflex stent patent with 60% stenosis after  the stent, RCA 99%, EF 60-65%.  His RCA was treated with a bare-metal stent   CVA (cerebral infarction) 2011   Right cerebral; total obstruction of the right ICA   Diverticulitis    Hypertension    Paroxysmal atrial fibrillation with RVR ([place]) 04/09/2021   Pleuritic chest pain 04/08/2014   Pressure injury of skin 03/30/2021   Rash 03/05/2021   Rectal bleeding 07/2015   Refractory nausea and vomiting 06/20/2020   Renal carcinoma ([place])    Sepsis, unspecified organism ([place]) 04/11/2016   Stroke ([place])    Tobacco abuse, in remission     Past Surgical History:  Procedure Laterality Date   AV FISTULA PLACEMENT Right 04/03/2021   Procedure: ARTERIOVENOUS (AV) CREATION OF RIGHT ARM BRACHIOCEPHALIC FISTULA;  Surgeon: Cherre Robins, MD;  Location: [facility];  Service: Vascular;  Laterality: Right;   BIOPSY  04/08/2021   Procedure: BIOPSY;  Surgeon: Lavena Bullion, DO;  Location: [facility];  Service: Gastroenterology;;   COLON SURGERY     COLONOSCOPY N/A 08/22/2015   Procedure: COLONOSCOPY;  Surgeon: [name], MD;  Location: [facility];  Service: Endoscopy;  Laterality: N/A;   ESOPHAGOGASTRODUODENOSCOPY N/A 07/18/2020   Procedure: ESOPHAGOGASTRODUODENOSCOPY (EGD);  Surgeon: Milus Banister, MD;  Location: [facility];  Service: Endoscopy;  Laterality: N/A;   ESOPHAGOGASTRODUODENOSCOPY (EGD) WITH PROPOFOL N/A 06/21/2020   Procedure: ESOPHAGOGASTRODUODENOSCOPY (EGD) WITH PROPOFOL;  Surgeon: Irene Shipper, MD;  Location: [facility] ENDOSCOPY;  Service: Endoscopy;  Laterality: N/A;   ESOPHAGOGASTRODUODENOSCOPY (EGD) WITH  PROPOFOL N/A 04/08/2021   Procedure: ESOPHAGOGASTRODUODENOSCOPY (EGD) WITH PROPOFOL;  Surgeon: Lavena Bullion, DO;  Location: [facility];  Service: Gastroenterology;  Laterality: N/A;   EUS N/A 07/18/2020   Procedure: UPPER ENDOSCOPIC ULTRASOUND (EUS) RADIAL;  Surgeon: Milus Banister, MD;  Location: [facility];  Service: Endoscopy;  Laterality: N/A;   FINE NEEDLE ASPIRATION  N/A 07/18/2020   Procedure: FINE NEEDLE ASPIRATION (FNA) LINEAR;  Surgeon: Milus Banister, MD;  Location: [facility];  Service: Endoscopy;  Laterality: N/A;   HEMOSTASIS CLIP PLACEMENT  04/08/2021   Procedure: HEMOSTASIS CLIP PLACEMENT;  Surgeon: Lavena Bullion, DO;  Location: [facility];  Service: Gastroenterology;;   hip relacement     INCISION AND DRAINAGE Right 09/11/2021   Procedure: INCISION AND DRAINAGE OF RIGHT ARM FISTULA;  Surgeon: Cherre Robins, MD;  Location: [facility];  Service: Vascular;  Laterality: Right;   IR FLUORO GUIDE CV LINE RIGHT  03/31/2021   IR REMOVAL TUN CV CATH W/O FL  08/15/2021   IR US GUIDE VASC ACCESS RIGHT  03/31/2021   KIDNEY SURGERY     LEFT HEART CATH AND CORONARY ANGIOGRAPHY N/A 07/09/2020   Procedure: LEFT HEART CATH AND CORONARY ANGIOGRAPHY;  Surgeon: Leonie Man, MD;  Location: [facility] CV LAB;  Service: Cardiovascular;  Laterality: N/A;   LEFT HEART CATHETERIZATION WITH CORONARY ANGIOGRAM N/A 02/23/2011   Procedure: LEFT HEART CATHETERIZATION WITH CORONARY ANGIOGRAM;  Surgeon: [name], MD;  Location: [facility] CATH LAB;  Service: Cardiovascular;  Laterality: N/A;   LEFT HEART CATHETERIZATION WITH CORONARY ANGIOGRAM N/A 03/18/2011   Procedure: LEFT HEART CATHETERIZATION WITH CORONARY ANGIOGRAM;  Surgeon: [name], MD;  Location: [facility] CATH LAB;  Service: Cardiovascular;  Laterality: N/A;   PERCUTANEOUS CORONARY STENT INTERVENTION (PCI-S) N/A 02/23/2011   Procedure: PERCUTANEOUS CORONARY STENT INTERVENTION (PCI-S);  Surgeon: [name], MD;  Location: [facility] CATH LAB;  Service: Cardiovascular;  Laterality: N/A;   TEMPORARY PACEMAKER INSERTION N/A 02/23/2011   Procedure: TEMPORARY PACEMAKER INSERTION;  Surgeon: [name], MD;  Location: [facility] CATH LAB;  Service: Cardiovascular;  Laterality: N/A;   THROMBECTOMY W/ EMBOLECTOMY Right 04/03/2021   Procedure: THROMBECTOMY ARTERIOVENOUS FISTULA;  Surgeon: Cherre Robins, MD;  Location: [facility];   Service: Vascular;  Laterality: Right;    Current Medications: Current Meds  Medication Sig   acetaminophen (TYLENOL) 325 MG tablet Take 2 tablets (650 mg total) by mouth every 6 (six) hours as needed for mild pain (or Fever >/= 101).   aspirin EC 81 MG tablet Take 81 mg by mouth daily.  (0900)   bisacodyl (DULCOLAX) 10 MG suppository Place 10 mg rectally daily as needed for moderate constipation.   busPIRone (BUSPAR) 5 MG tablet Take 5 mg by mouth 2 (two) times daily. (0800 & 2000)   camphor-menthol (SARNA) lotion Apply topically as needed for itching.   cefdinir (OMNICEF) 300 MG capsule Take 1 capsule (300 mg total) by mouth daily.   Darbepoetin Alfa (ARANESP) 60 MCG/0.3ML SOSY injection Inject 0.3 mLs (60 mcg total) into the vein every Monday with hemodialysis.   diltiazem (CARDIZEM CD) 240 MG 24 hr capsule Take 1 capsule (240 mg total) by mouth daily.   ferrous sulfate 325 (65 FE) MG tablet Take 1 tablet by mouth every Monday, Wednesday, and Friday. (0900)   hydrOXYzine (ATARAX) 10 MG tablet Take 10 mg by mouth every 6 (six) hours as needed for itching.   Ipratropium-Albuterol (COMBIVENT) 20-100 MCG/ACT AERS respimat Inhale 1 puff  into the lungs 4 (four) times daily. (0900, 1300, 1700 & 2100)   ipratropium-albuterol (DUONEB) 0.5-2.5 (3) MG/3ML SOLN Take 3 mLs by nebulization every 4 (four) hours as needed (shortness of breath/wheezing).   lanthanum (FOSRENOL) 1000 MG chewable tablet Chew 1 tablet (1,000 mg total) by mouth 3 (three) times daily with meals.   levothyroxine (SYNTHROID) 50 MCG tablet Take 50 mcg by mouth daily before breakfast. (0630)   melatonin 5 MG TABS Take 5 mg by mouth at bedtime as needed (sleep).   Menthol, Topical Analgesic, (BIOFREEZE) 4 % GEL Apply 1 Application topically 3 (three) times daily as needed (pain).   metoprolol tartrate (LOPRESSOR) 25 MG tablet Take 0.5 tablets (12.5 mg total) by mouth 2 (two) times daily.   Multiple Vitamin (MULTIVITAMIN WITH MINERALS)  TABS tablet Take 1 tablet by mouth daily. (0900)   Multiple Vitamins-Minerals (DECUBI-VITE PO) Take 1 tablet by mouth in the morning. (0900)   naphazoline-glycerin (CLEAR EYES REDNESS) 0.012-0.25 % SOLN Place 1-2 drops into the right eye 3 (three) times daily.   neomycin-bacitracin-polymyxin (NEOSPORIN) OINT Apply 1 application topically 3 (three) times daily. To right antecubital area   Nutritional Supplements (NOVASOURCE RENAL) LIQD Take 237 mLs by mouth in the morning and at bedtime. (0800 & 1700)   ondansetron (ZOFRAN) 4 MG tablet Take 1 tablet (4 mg total) by mouth daily as needed for nausea or vomiting.   oxyCODONE (OXY IR/ROXICODONE) 5 MG immediate release tablet Take 1 tablet (5 mg total) by mouth every 6 (six) hours as needed for severe pain.   pantoprazole (PROTONIX) 40 MG tablet Take 1 tablet (40 mg total) by mouth 2 (two) times daily.   polyethylene glycol (MIRALAX / GLYCOLAX) 17 g packet Take 17 g by mouth daily.   pravastatin (PRAVACHOL) 40 MG tablet Take 40 mg by mouth at bedtime. (2100)   tamsulosin (FLOMAX) 0.4 MG CAPS capsule Take 1 capsule (0.4 mg total) by mouth daily.     Allergies:   Bee venom and Influenza vaccines   Social History   Socioeconomic History   Marital status: Divorced    Spouse name: Not on file   Number of children: Not on file   Years of education: Not on file   Highest education level: Not on file  Occupational History   Not on file  Tobacco Use   Smoking status: Former    Types: Cigarettes    Quit date: 02/24/2007    Years since quitting: 14.6   Smokeless tobacco: Never  Vaping Use   Vaping Use: Never used  Substance and Sexual Activity   Alcohol use: Not Currently    Alcohol/week: 1.0 standard drink of alcohol    Types: 1 Standard drinks or equivalent per week    Comment: socially    Drug use: No   Sexual activity: Not on file  Other Topics Concern   Not on file  Social History Narrative   Not on file   Social Determinants of Health    Financial Resource Strain: Not on file  Food Insecurity: Not on file  Transportation Needs: Not on file  Physical Activity: Not on file  Stress: Not on file  Social Connections: Not on file     Family History:  The patient's  family history includes Heart attack (age of onset: 71) in an other family member.   ROS:   Please see the history of present illness.    ROS All other systems reviewed and are negative.   PHYSICAL  EXAM:   VS:  BP (!) 120/50 (BP Location: Left Arm, Patient Position: Sitting, Cuff Size: Normal)   Pulse 86   Ht '6\' 2"'$  (1.88 m)   Wt 170 lb (77.1 kg)   SpO2 95%   BMI 21.83 kg/m   Physical Exam  GEN: Thin, elderly, in no acute distress  Neck: no JVD, carotid bruits, or masses Cardiac:RRR; no murmurs, rubs, or gallops  Respiratory: decreased breath sounds with fine crackles throughout GI: soft, nontender, nondistended, + BS Ext: without cyanosis, clubbing, or edema, Good distal pulses bilaterally Neuro:  Alert and Oriented x 3,  Psych: euthymic mood, full affect  Wt Readings from Last 3 Encounters:  10/08/21 170 lb (77.1 kg)  09/30/21 167 lb 1.6 oz (75.8 kg)  09/17/21 145 lb 8.1 oz (66 kg)      Studies/Labs Reviewed:   EKG:  EKG is not ordered today.     Recent Labs: 03/17/2021: B Natriuretic Peptide 280.9 08/13/2021: ALT 13 09/29/2021: TSH 28.658 09/30/2021: BUN 43; Creatinine, Ser 4.89; Hemoglobin 10.0; Magnesium 1.6; Platelets 208; Potassium 4.3; Sodium 139   Lipid Panel    Component Value Date/Time   CHOL 65 08/12/2020 0400   TRIG 74 08/12/2020 0400   HDL 27 (L) 08/12/2020 0400   CHOLHDL 2.4 08/12/2020 0400   VLDL 15 08/12/2020 0400   LDLCALC 23 08/12/2020 0400    Additional studies/ records that were reviewed today include:  Echo 09/30/21 IMPRESSIONS     1. Left ventricular ejection fraction, by estimation, is 55 to 60%. The  left ventricle has normal function. The left ventricle has no regional  wall motion abnormalities. The left  ventricular internal cavity size was  moderately dilated. There is mild  concentric left ventricular hypertrophy. Left ventricular diastolic  parameters are consistent with Grade I diastolic dysfunction (impaired  relaxation).   2. Right ventricular systolic function is normal. The right ventricular  size is normal. There is normal pulmonary artery systolic pressure. The  estimated right ventricular systolic pressure is 97.9 mmHg.   3. Left atrial size was moderately dilated.   4. Right atrial size was moderately dilated.   5. The mitral valve is normal in structure. Trivial mitral valve  regurgitation.   6. The aortic valve is tricuspid. Aortic valve regurgitation is trivial.  No aortic stenosis is present.   7. The inferior vena cava is normal in size with greater than 50%  respiratory variability, suggesting right atrial pressure of 3 mmHg.   Comparison(s): No significant change from prior study. Prior images  reviewed side by side.   Echo 02/2021 IMPRESSIONS Left ventricular ejection fraction, by estimation, is 65 to 70%. The left ventricle has hyperdynamic function. The left ventricle has no regional wall motion abnormalities. There is mild concentric left ventricular hypertrophy. Left ventricular diastolic parameters are consistent with Grade I diastolic dysfunction (impaired relaxation). 1. Right ventricular systolic function is normal. The right ventricular size is normal. There is mildly elevated pulmonary artery systolic pressure. 2. 3. Left atrial size was mildly dilated. 4. 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 can't rule out PFO. 5. Right atrial size was mildly dilated. 6. The mitral valve is normal in structure. No evidence of mitral valve regurgitation. 7. The aortic valve was not well visualized. Aortic valve regurgitation is mild to moderate. The inferior vena cava is normal in size with greater than 50% respiratory  variability, suggesting right atrial pressure of 3 mmHg. 8. Comparison(s): No prior Echocardiogram.  FINDINGS Left Ventricle: Left ventricular ejection fraction, by estimation, is 65 to 70%. The left ventricle has hyperdynamic function. The left ventricle has no regional wall motion abnormalities. The left ventricular internal cavity size was normal in size. There is mild concentric left ventricular hypertrophy. Left ventricular diastolic parameters are consistent with Grade I diastolic dysfunction (impaired relaxation). Right Ventricle: The right ventricular size is normal. No increase in right ventricular wall thickness.   Risk Assessment/Calculations:    CHA2DS2-VASc Score = 3   This indicates a 3.2% annual risk of stroke. The patient's score is based upon: CHF History: 0 HTN History: 0 Diabetes History: 0 Stroke History: 0 Vascular Disease History: 1 Age Score: 2 Gender Score: 0        ASSESSMENT:    1. Paroxysmal atrial fibrillation (HCC)   2. Paroxysmal atrial flutter ([place])   3. Coronary artery disease involving native coronary artery of native heart without angina pectoris   4. ESRD (end stage renal disease) on MWF dialysis ([place])   5. Hypothyroidism, unspecified type      PLAN:  In order of problems listed above:  Paroxysmal atrial fibrillation/flutter-initially documented February 2023. Dr. Stanford Breed reviewed the patient's admission ECGs.  Initial arrhythmia may have been SVT.  I have increased his Cardizem to 240 mg daily and he remains in sinus rhythm.  We will continue.  Issue of anticoagulation is somewhat difficult.  His CHA2DS2-VASc is 5 and he is at risk for embolic event.  However he has fallen most recently with rib and pelvis injuries.  He also had GI bleed in February.  We will hold for now but should be readdressed at office follow-up.  Alternatively could consider watchman. Patient checks his pulse ox 3 times a day and no palpitations or fast HR on  increased diltiazem. If recurrence, place Zio. Hold off on anticoagulation for now and f/u with Dr. Burt Knack.   question lipoma on previous echocardiogram-.No mention of lipoma on echo 09/30/21.   coronary artery disease-plan to continue medical therapy with aspirin and statin.  He is not having chest pain.   ESRD-on HD    elevated TSH-Synthroid adjustment per primary service.   Shared Decision Making/Informed Consent        Medication Adjustments/Labs and Tests Ordered: Current medicines are reviewed at length with the patient today.  Concerns regarding medicines are outlined above.  Medication changes, Labs and Tests ordered today are listed in the Patient Instructions below. Patient Instructions  Medication Instructions:  Your physician recommends that you continue on your current medications as directed. Please refer to the Current Medication list given to you today.  *If you need a refill on your cardiac medications before your next appointment, please call your pharmacy*   Lab Work: None ordered   If you have labs (blood work) drawn today and your tests are completely normal, you will receive your results only by: [place] (if you have MyChart) OR A paper copy in the mail If you have any lab test that is abnormal or we need to change your treatment, we will call you to review the results.   Testing/Procedures: None ordered    Follow-Up: Follow up as scheduled    Other Instructions   Important Information About Sugar         Sumner Boast, PA-C  10/08/2021 11:46 AM    [address], [place], [place]  [address] Phone: [phone]; Fax: [phone]

## 2021-10-02 DIAGNOSIS — J449 Chronic obstructive pulmonary disease, unspecified: Secondary | ICD-10-CM | POA: Diagnosis not present

## 2021-10-02 DIAGNOSIS — R262 Difficulty in walking, not elsewhere classified: Secondary | ICD-10-CM | POA: Diagnosis not present

## 2021-10-02 DIAGNOSIS — S329XXA Fracture of unspecified parts of lumbosacral spine and pelvis, initial encounter for closed fracture: Secondary | ICD-10-CM | POA: Diagnosis not present

## 2021-10-02 DIAGNOSIS — J9621 Acute and chronic respiratory failure with hypoxia: Secondary | ICD-10-CM | POA: Diagnosis not present

## 2021-10-02 DIAGNOSIS — I10 Essential (primary) hypertension: Secondary | ICD-10-CM | POA: Diagnosis not present

## 2021-10-02 DIAGNOSIS — I471 Supraventricular tachycardia: Secondary | ICD-10-CM | POA: Diagnosis not present

## 2021-10-02 DIAGNOSIS — R278 Other lack of coordination: Secondary | ICD-10-CM | POA: Diagnosis not present

## 2021-10-02 DIAGNOSIS — L89134 Pressure ulcer of right lower back, stage 4: Secondary | ICD-10-CM | POA: Diagnosis not present

## 2021-10-02 DIAGNOSIS — G894 Chronic pain syndrome: Secondary | ICD-10-CM | POA: Diagnosis not present

## 2021-10-02 DIAGNOSIS — J9611 Chronic respiratory failure with hypoxia: Secondary | ICD-10-CM | POA: Diagnosis not present

## 2021-10-02 DIAGNOSIS — R293 Abnormal posture: Secondary | ICD-10-CM | POA: Diagnosis not present

## 2021-10-02 DIAGNOSIS — E46 Unspecified protein-calorie malnutrition: Secondary | ICD-10-CM | POA: Diagnosis not present

## 2021-10-02 DIAGNOSIS — D500 Iron deficiency anemia secondary to blood loss (chronic): Secondary | ICD-10-CM | POA: Diagnosis not present

## 2021-10-02 DIAGNOSIS — F322 Major depressive disorder, single episode, severe without psychotic features: Secondary | ICD-10-CM | POA: Diagnosis not present

## 2021-10-02 DIAGNOSIS — N186 End stage renal disease: Secondary | ICD-10-CM | POA: Diagnosis not present

## 2021-10-02 DIAGNOSIS — I480 Paroxysmal atrial fibrillation: Secondary | ICD-10-CM | POA: Diagnosis not present

## 2021-10-02 DIAGNOSIS — J189 Pneumonia, unspecified organism: Secondary | ICD-10-CM | POA: Diagnosis not present

## 2021-10-02 DIAGNOSIS — M6281 Muscle weakness (generalized): Secondary | ICD-10-CM | POA: Diagnosis not present

## 2021-10-02 DIAGNOSIS — F419 Anxiety disorder, unspecified: Secondary | ICD-10-CM | POA: Diagnosis not present

## 2021-10-02 DIAGNOSIS — R531 Weakness: Secondary | ICD-10-CM | POA: Diagnosis not present

## 2021-10-02 DIAGNOSIS — I5033 Acute on chronic diastolic (congestive) heart failure: Secondary | ICD-10-CM | POA: Diagnosis not present

## 2021-10-03 DIAGNOSIS — J9611 Chronic respiratory failure with hypoxia: Secondary | ICD-10-CM | POA: Diagnosis not present

## 2021-10-03 DIAGNOSIS — J449 Chronic obstructive pulmonary disease, unspecified: Secondary | ICD-10-CM | POA: Diagnosis not present

## 2021-10-03 DIAGNOSIS — M6281 Muscle weakness (generalized): Secondary | ICD-10-CM | POA: Diagnosis not present

## 2021-10-03 DIAGNOSIS — R262 Difficulty in walking, not elsewhere classified: Secondary | ICD-10-CM | POA: Diagnosis not present

## 2021-10-03 DIAGNOSIS — R293 Abnormal posture: Secondary | ICD-10-CM | POA: Diagnosis not present

## 2021-10-03 DIAGNOSIS — R278 Other lack of coordination: Secondary | ICD-10-CM | POA: Diagnosis not present

## 2021-10-04 DIAGNOSIS — R278 Other lack of coordination: Secondary | ICD-10-CM | POA: Diagnosis not present

## 2021-10-04 DIAGNOSIS — J449 Chronic obstructive pulmonary disease, unspecified: Secondary | ICD-10-CM | POA: Diagnosis not present

## 2021-10-04 DIAGNOSIS — M6281 Muscle weakness (generalized): Secondary | ICD-10-CM | POA: Diagnosis not present

## 2021-10-04 DIAGNOSIS — R293 Abnormal posture: Secondary | ICD-10-CM | POA: Diagnosis not present

## 2021-10-04 DIAGNOSIS — R262 Difficulty in walking, not elsewhere classified: Secondary | ICD-10-CM | POA: Diagnosis not present

## 2021-10-04 DIAGNOSIS — J9611 Chronic respiratory failure with hypoxia: Secondary | ICD-10-CM | POA: Diagnosis not present

## 2021-10-05 DIAGNOSIS — J449 Chronic obstructive pulmonary disease, unspecified: Secondary | ICD-10-CM | POA: Diagnosis not present

## 2021-10-05 DIAGNOSIS — M6281 Muscle weakness (generalized): Secondary | ICD-10-CM | POA: Diagnosis not present

## 2021-10-05 DIAGNOSIS — R278 Other lack of coordination: Secondary | ICD-10-CM | POA: Diagnosis not present

## 2021-10-05 DIAGNOSIS — R293 Abnormal posture: Secondary | ICD-10-CM | POA: Diagnosis not present

## 2021-10-05 DIAGNOSIS — J9611 Chronic respiratory failure with hypoxia: Secondary | ICD-10-CM | POA: Diagnosis not present

## 2021-10-05 DIAGNOSIS — R262 Difficulty in walking, not elsewhere classified: Secondary | ICD-10-CM | POA: Diagnosis not present

## 2021-10-06 DIAGNOSIS — J9611 Chronic respiratory failure with hypoxia: Secondary | ICD-10-CM | POA: Diagnosis not present

## 2021-10-06 DIAGNOSIS — R293 Abnormal posture: Secondary | ICD-10-CM | POA: Diagnosis not present

## 2021-10-06 DIAGNOSIS — J449 Chronic obstructive pulmonary disease, unspecified: Secondary | ICD-10-CM | POA: Diagnosis not present

## 2021-10-06 DIAGNOSIS — M6281 Muscle weakness (generalized): Secondary | ICD-10-CM | POA: Diagnosis not present

## 2021-10-06 DIAGNOSIS — R262 Difficulty in walking, not elsewhere classified: Secondary | ICD-10-CM | POA: Diagnosis not present

## 2021-10-06 DIAGNOSIS — R278 Other lack of coordination: Secondary | ICD-10-CM | POA: Diagnosis not present

## 2021-10-07 DIAGNOSIS — M6281 Muscle weakness (generalized): Secondary | ICD-10-CM | POA: Diagnosis not present

## 2021-10-07 DIAGNOSIS — J449 Chronic obstructive pulmonary disease, unspecified: Secondary | ICD-10-CM | POA: Diagnosis not present

## 2021-10-07 DIAGNOSIS — R262 Difficulty in walking, not elsewhere classified: Secondary | ICD-10-CM | POA: Diagnosis not present

## 2021-10-07 DIAGNOSIS — R278 Other lack of coordination: Secondary | ICD-10-CM | POA: Diagnosis not present

## 2021-10-07 DIAGNOSIS — R293 Abnormal posture: Secondary | ICD-10-CM | POA: Diagnosis not present

## 2021-10-07 DIAGNOSIS — J9611 Chronic respiratory failure with hypoxia: Secondary | ICD-10-CM | POA: Diagnosis not present

## 2021-10-08 ENCOUNTER — Encounter: Payer: Self-pay | Admitting: Physician Assistant

## 2021-10-08 ENCOUNTER — Ambulatory Visit (INDEPENDENT_AMBULATORY_CARE_PROVIDER_SITE_OTHER): Payer: Medicare Other | Admitting: Physician Assistant

## 2021-10-08 VITALS — BP 120/50 | HR 86 | Ht 74.0 in | Wt 170.0 lb

## 2021-10-08 DIAGNOSIS — N186 End stage renal disease: Secondary | ICD-10-CM | POA: Diagnosis not present

## 2021-10-08 DIAGNOSIS — I2510 Atherosclerotic heart disease of native coronary artery without angina pectoris: Secondary | ICD-10-CM

## 2021-10-08 DIAGNOSIS — Z992 Dependence on renal dialysis: Secondary | ICD-10-CM

## 2021-10-08 DIAGNOSIS — R262 Difficulty in walking, not elsewhere classified: Secondary | ICD-10-CM | POA: Diagnosis not present

## 2021-10-08 DIAGNOSIS — M6281 Muscle weakness (generalized): Secondary | ICD-10-CM | POA: Diagnosis not present

## 2021-10-08 DIAGNOSIS — I480 Paroxysmal atrial fibrillation: Secondary | ICD-10-CM

## 2021-10-08 DIAGNOSIS — R278 Other lack of coordination: Secondary | ICD-10-CM | POA: Diagnosis not present

## 2021-10-08 DIAGNOSIS — R293 Abnormal posture: Secondary | ICD-10-CM | POA: Diagnosis not present

## 2021-10-08 DIAGNOSIS — E039 Hypothyroidism, unspecified: Secondary | ICD-10-CM | POA: Diagnosis not present

## 2021-10-08 DIAGNOSIS — I4892 Unspecified atrial flutter: Secondary | ICD-10-CM | POA: Diagnosis not present

## 2021-10-08 DIAGNOSIS — J449 Chronic obstructive pulmonary disease, unspecified: Secondary | ICD-10-CM | POA: Diagnosis not present

## 2021-10-08 DIAGNOSIS — J9611 Chronic respiratory failure with hypoxia: Secondary | ICD-10-CM | POA: Diagnosis not present

## 2021-10-08 NOTE — Patient Instructions (Signed)

## 2021-10-09 DIAGNOSIS — R293 Abnormal posture: Secondary | ICD-10-CM | POA: Diagnosis not present

## 2021-10-09 DIAGNOSIS — J9611 Chronic respiratory failure with hypoxia: Secondary | ICD-10-CM | POA: Diagnosis not present

## 2021-10-09 DIAGNOSIS — R278 Other lack of coordination: Secondary | ICD-10-CM | POA: Diagnosis not present

## 2021-10-09 DIAGNOSIS — M6281 Muscle weakness (generalized): Secondary | ICD-10-CM | POA: Diagnosis not present

## 2021-10-09 DIAGNOSIS — R262 Difficulty in walking, not elsewhere classified: Secondary | ICD-10-CM | POA: Diagnosis not present

## 2021-10-09 DIAGNOSIS — J449 Chronic obstructive pulmonary disease, unspecified: Secondary | ICD-10-CM | POA: Diagnosis not present

## 2021-10-09 DIAGNOSIS — L89134 Pressure ulcer of right lower back, stage 4: Secondary | ICD-10-CM | POA: Diagnosis not present

## 2021-10-11 DIAGNOSIS — R278 Other lack of coordination: Secondary | ICD-10-CM | POA: Diagnosis not present

## 2021-10-11 DIAGNOSIS — J449 Chronic obstructive pulmonary disease, unspecified: Secondary | ICD-10-CM | POA: Diagnosis not present

## 2021-10-11 DIAGNOSIS — R262 Difficulty in walking, not elsewhere classified: Secondary | ICD-10-CM | POA: Diagnosis not present

## 2021-10-11 DIAGNOSIS — M6281 Muscle weakness (generalized): Secondary | ICD-10-CM | POA: Diagnosis not present

## 2021-10-11 DIAGNOSIS — J9611 Chronic respiratory failure with hypoxia: Secondary | ICD-10-CM | POA: Diagnosis not present

## 2021-10-11 DIAGNOSIS — R293 Abnormal posture: Secondary | ICD-10-CM | POA: Diagnosis not present

## 2021-10-12 DIAGNOSIS — R293 Abnormal posture: Secondary | ICD-10-CM | POA: Diagnosis not present

## 2021-10-12 DIAGNOSIS — J9611 Chronic respiratory failure with hypoxia: Secondary | ICD-10-CM | POA: Diagnosis not present

## 2021-10-12 DIAGNOSIS — R278 Other lack of coordination: Secondary | ICD-10-CM | POA: Diagnosis not present

## 2021-10-12 DIAGNOSIS — R262 Difficulty in walking, not elsewhere classified: Secondary | ICD-10-CM | POA: Diagnosis not present

## 2021-10-12 DIAGNOSIS — M6281 Muscle weakness (generalized): Secondary | ICD-10-CM | POA: Diagnosis not present

## 2021-10-12 DIAGNOSIS — J449 Chronic obstructive pulmonary disease, unspecified: Secondary | ICD-10-CM | POA: Diagnosis not present

## 2021-10-13 DIAGNOSIS — R278 Other lack of coordination: Secondary | ICD-10-CM | POA: Diagnosis not present

## 2021-10-13 DIAGNOSIS — R0989 Other specified symptoms and signs involving the circulatory and respiratory systems: Secondary | ICD-10-CM | POA: Diagnosis not present

## 2021-10-13 DIAGNOSIS — R059 Cough, unspecified: Secondary | ICD-10-CM | POA: Diagnosis not present

## 2021-10-13 DIAGNOSIS — M6281 Muscle weakness (generalized): Secondary | ICD-10-CM | POA: Diagnosis not present

## 2021-10-13 DIAGNOSIS — R293 Abnormal posture: Secondary | ICD-10-CM | POA: Diagnosis not present

## 2021-10-13 DIAGNOSIS — F419 Anxiety disorder, unspecified: Secondary | ICD-10-CM | POA: Diagnosis not present

## 2021-10-13 DIAGNOSIS — I120 Hypertensive chronic kidney disease with stage 5 chronic kidney disease or end stage renal disease: Secondary | ICD-10-CM | POA: Diagnosis not present

## 2021-10-13 DIAGNOSIS — J449 Chronic obstructive pulmonary disease, unspecified: Secondary | ICD-10-CM | POA: Diagnosis not present

## 2021-10-13 DIAGNOSIS — J9611 Chronic respiratory failure with hypoxia: Secondary | ICD-10-CM | POA: Diagnosis not present

## 2021-10-13 DIAGNOSIS — G894 Chronic pain syndrome: Secondary | ICD-10-CM | POA: Diagnosis not present

## 2021-10-13 DIAGNOSIS — R262 Difficulty in walking, not elsewhere classified: Secondary | ICD-10-CM | POA: Diagnosis not present

## 2021-10-14 DIAGNOSIS — J9611 Chronic respiratory failure with hypoxia: Secondary | ICD-10-CM | POA: Diagnosis not present

## 2021-10-14 DIAGNOSIS — M6281 Muscle weakness (generalized): Secondary | ICD-10-CM | POA: Diagnosis not present

## 2021-10-14 DIAGNOSIS — R262 Difficulty in walking, not elsewhere classified: Secondary | ICD-10-CM | POA: Diagnosis not present

## 2021-10-14 DIAGNOSIS — R293 Abnormal posture: Secondary | ICD-10-CM | POA: Diagnosis not present

## 2021-10-14 DIAGNOSIS — J449 Chronic obstructive pulmonary disease, unspecified: Secondary | ICD-10-CM | POA: Diagnosis not present

## 2021-10-14 DIAGNOSIS — R278 Other lack of coordination: Secondary | ICD-10-CM | POA: Diagnosis not present

## 2021-10-15 DIAGNOSIS — M6281 Muscle weakness (generalized): Secondary | ICD-10-CM | POA: Diagnosis not present

## 2021-10-15 DIAGNOSIS — I10 Essential (primary) hypertension: Secondary | ICD-10-CM | POA: Diagnosis not present

## 2021-10-15 DIAGNOSIS — J9611 Chronic respiratory failure with hypoxia: Secondary | ICD-10-CM | POA: Diagnosis not present

## 2021-10-15 DIAGNOSIS — R293 Abnormal posture: Secondary | ICD-10-CM | POA: Diagnosis not present

## 2021-10-15 DIAGNOSIS — I480 Paroxysmal atrial fibrillation: Secondary | ICD-10-CM | POA: Diagnosis not present

## 2021-10-15 DIAGNOSIS — G894 Chronic pain syndrome: Secondary | ICD-10-CM | POA: Diagnosis not present

## 2021-10-15 DIAGNOSIS — K5900 Constipation, unspecified: Secondary | ICD-10-CM | POA: Diagnosis not present

## 2021-10-15 DIAGNOSIS — J449 Chronic obstructive pulmonary disease, unspecified: Secondary | ICD-10-CM | POA: Diagnosis not present

## 2021-10-15 DIAGNOSIS — R262 Difficulty in walking, not elsewhere classified: Secondary | ICD-10-CM | POA: Diagnosis not present

## 2021-10-15 DIAGNOSIS — R278 Other lack of coordination: Secondary | ICD-10-CM | POA: Diagnosis not present

## 2021-10-16 DIAGNOSIS — J449 Chronic obstructive pulmonary disease, unspecified: Secondary | ICD-10-CM | POA: Diagnosis not present

## 2021-10-16 DIAGNOSIS — R278 Other lack of coordination: Secondary | ICD-10-CM | POA: Diagnosis not present

## 2021-10-16 DIAGNOSIS — R293 Abnormal posture: Secondary | ICD-10-CM | POA: Diagnosis not present

## 2021-10-16 DIAGNOSIS — R262 Difficulty in walking, not elsewhere classified: Secondary | ICD-10-CM | POA: Diagnosis not present

## 2021-10-16 DIAGNOSIS — L89134 Pressure ulcer of right lower back, stage 4: Secondary | ICD-10-CM | POA: Diagnosis not present

## 2021-10-16 DIAGNOSIS — M6281 Muscle weakness (generalized): Secondary | ICD-10-CM | POA: Diagnosis not present

## 2021-10-16 DIAGNOSIS — J9611 Chronic respiratory failure with hypoxia: Secondary | ICD-10-CM | POA: Diagnosis not present

## 2021-10-17 DIAGNOSIS — F419 Anxiety disorder, unspecified: Secondary | ICD-10-CM | POA: Diagnosis not present

## 2021-10-17 DIAGNOSIS — M6281 Muscle weakness (generalized): Secondary | ICD-10-CM | POA: Diagnosis not present

## 2021-10-17 DIAGNOSIS — J9611 Chronic respiratory failure with hypoxia: Secondary | ICD-10-CM | POA: Diagnosis not present

## 2021-10-17 DIAGNOSIS — G894 Chronic pain syndrome: Secondary | ICD-10-CM | POA: Diagnosis not present

## 2021-10-17 DIAGNOSIS — J449 Chronic obstructive pulmonary disease, unspecified: Secondary | ICD-10-CM | POA: Diagnosis not present

## 2021-10-17 DIAGNOSIS — R278 Other lack of coordination: Secondary | ICD-10-CM | POA: Diagnosis not present

## 2021-10-17 DIAGNOSIS — R262 Difficulty in walking, not elsewhere classified: Secondary | ICD-10-CM | POA: Diagnosis not present

## 2021-10-17 DIAGNOSIS — R293 Abnormal posture: Secondary | ICD-10-CM | POA: Diagnosis not present

## 2021-10-17 DIAGNOSIS — I120 Hypertensive chronic kidney disease with stage 5 chronic kidney disease or end stage renal disease: Secondary | ICD-10-CM | POA: Diagnosis not present

## 2021-10-18 DIAGNOSIS — J449 Chronic obstructive pulmonary disease, unspecified: Secondary | ICD-10-CM | POA: Diagnosis not present

## 2021-10-18 DIAGNOSIS — M6281 Muscle weakness (generalized): Secondary | ICD-10-CM | POA: Diagnosis not present

## 2021-10-18 DIAGNOSIS — R293 Abnormal posture: Secondary | ICD-10-CM | POA: Diagnosis not present

## 2021-10-18 DIAGNOSIS — R262 Difficulty in walking, not elsewhere classified: Secondary | ICD-10-CM | POA: Diagnosis not present

## 2021-10-18 DIAGNOSIS — J9611 Chronic respiratory failure with hypoxia: Secondary | ICD-10-CM | POA: Diagnosis not present

## 2021-10-18 DIAGNOSIS — R278 Other lack of coordination: Secondary | ICD-10-CM | POA: Diagnosis not present

## 2021-10-18 DIAGNOSIS — E119 Type 2 diabetes mellitus without complications: Secondary | ICD-10-CM | POA: Diagnosis not present

## 2021-10-19 DIAGNOSIS — R262 Difficulty in walking, not elsewhere classified: Secondary | ICD-10-CM | POA: Diagnosis not present

## 2021-10-19 DIAGNOSIS — M6281 Muscle weakness (generalized): Secondary | ICD-10-CM | POA: Diagnosis not present

## 2021-10-19 DIAGNOSIS — R293 Abnormal posture: Secondary | ICD-10-CM | POA: Diagnosis not present

## 2021-10-19 DIAGNOSIS — J449 Chronic obstructive pulmonary disease, unspecified: Secondary | ICD-10-CM | POA: Diagnosis not present

## 2021-10-19 DIAGNOSIS — R278 Other lack of coordination: Secondary | ICD-10-CM | POA: Diagnosis not present

## 2021-10-19 DIAGNOSIS — J9611 Chronic respiratory failure with hypoxia: Secondary | ICD-10-CM | POA: Diagnosis not present

## 2021-10-20 DIAGNOSIS — G894 Chronic pain syndrome: Secondary | ICD-10-CM | POA: Diagnosis not present

## 2021-10-20 DIAGNOSIS — I120 Hypertensive chronic kidney disease with stage 5 chronic kidney disease or end stage renal disease: Secondary | ICD-10-CM | POA: Diagnosis not present

## 2021-10-20 DIAGNOSIS — I480 Paroxysmal atrial fibrillation: Secondary | ICD-10-CM | POA: Diagnosis not present

## 2021-10-20 DIAGNOSIS — M6281 Muscle weakness (generalized): Secondary | ICD-10-CM | POA: Diagnosis not present

## 2021-10-20 DIAGNOSIS — J9611 Chronic respiratory failure with hypoxia: Secondary | ICD-10-CM | POA: Diagnosis not present

## 2021-10-20 DIAGNOSIS — R262 Difficulty in walking, not elsewhere classified: Secondary | ICD-10-CM | POA: Diagnosis not present

## 2021-10-20 DIAGNOSIS — R140 Abdominal distension (gaseous): Secondary | ICD-10-CM | POA: Diagnosis not present

## 2021-10-20 DIAGNOSIS — J449 Chronic obstructive pulmonary disease, unspecified: Secondary | ICD-10-CM | POA: Diagnosis not present

## 2021-10-20 DIAGNOSIS — K5900 Constipation, unspecified: Secondary | ICD-10-CM | POA: Diagnosis not present

## 2021-10-20 DIAGNOSIS — R293 Abnormal posture: Secondary | ICD-10-CM | POA: Diagnosis not present

## 2021-10-20 DIAGNOSIS — R100 Acute abdomen: Secondary | ICD-10-CM | POA: Diagnosis not present

## 2021-10-20 DIAGNOSIS — R278 Other lack of coordination: Secondary | ICD-10-CM | POA: Diagnosis not present

## 2021-10-20 DIAGNOSIS — R109 Unspecified abdominal pain: Secondary | ICD-10-CM | POA: Diagnosis not present

## 2021-10-21 ENCOUNTER — Other Ambulatory Visit: Payer: Self-pay

## 2021-10-21 ENCOUNTER — Inpatient Hospital Stay (HOSPITAL_COMMUNITY)
Admission: EM | Admit: 2021-10-21 | Discharge: 2021-10-30 | DRG: 177 | Disposition: A | Discharge status: Skilled Nursing Facility | Payer: No Typology Code available for payment source | Source: Skilled Nursing Facility | Attending: Internal Medicine | Admitting: Internal Medicine

## 2021-10-21 ENCOUNTER — Emergency Department (HOSPITAL_COMMUNITY): Payer: No Typology Code available for payment source

## 2021-10-21 ENCOUNTER — Encounter (HOSPITAL_COMMUNITY): Payer: Self-pay

## 2021-10-21 ENCOUNTER — Emergency Department (HOSPITAL_BASED_OUTPATIENT_CLINIC_OR_DEPARTMENT_OTHER): Payer: No Typology Code available for payment source

## 2021-10-21 DIAGNOSIS — I480 Paroxysmal atrial fibrillation: Secondary | ICD-10-CM | POA: Diagnosis not present

## 2021-10-21 DIAGNOSIS — I252 Old myocardial infarction: Secondary | ICD-10-CM

## 2021-10-21 DIAGNOSIS — G894 Chronic pain syndrome: Secondary | ICD-10-CM | POA: Diagnosis not present

## 2021-10-21 DIAGNOSIS — Z9981 Dependence on supplemental oxygen: Secondary | ICD-10-CM

## 2021-10-21 DIAGNOSIS — J189 Pneumonia, unspecified organism: Secondary | ICD-10-CM | POA: Diagnosis not present

## 2021-10-21 DIAGNOSIS — M4854XS Collapsed vertebra, not elsewhere classified, thoracic region, sequela of fracture: Secondary | ICD-10-CM | POA: Diagnosis present

## 2021-10-21 DIAGNOSIS — R140 Abdominal distension (gaseous): Secondary | ICD-10-CM | POA: Diagnosis not present

## 2021-10-21 DIAGNOSIS — Z87892 Personal history of anaphylaxis: Secondary | ICD-10-CM

## 2021-10-21 DIAGNOSIS — I2510 Atherosclerotic heart disease of native coronary artery without angina pectoris: Secondary | ICD-10-CM | POA: Diagnosis not present

## 2021-10-21 DIAGNOSIS — Z91030 Bee allergy status: Secondary | ICD-10-CM

## 2021-10-21 DIAGNOSIS — S2243XA Multiple fractures of ribs, bilateral, initial encounter for closed fracture: Secondary | ICD-10-CM | POA: Diagnosis present

## 2021-10-21 DIAGNOSIS — R0781 Pleurodynia: Secondary | ICD-10-CM | POA: Diagnosis present

## 2021-10-21 DIAGNOSIS — Z9861 Coronary angioplasty status: Secondary | ICD-10-CM | POA: Diagnosis not present

## 2021-10-21 DIAGNOSIS — I959 Hypotension, unspecified: Secondary | ICD-10-CM | POA: Diagnosis not present

## 2021-10-21 DIAGNOSIS — Z85528 Personal history of other malignant neoplasm of kidney: Secondary | ICD-10-CM

## 2021-10-21 DIAGNOSIS — Z87891 Personal history of nicotine dependence: Secondary | ICD-10-CM

## 2021-10-21 DIAGNOSIS — M549 Dorsalgia, unspecified: Secondary | ICD-10-CM | POA: Diagnosis present

## 2021-10-21 DIAGNOSIS — E039 Hypothyroidism, unspecified: Secondary | ICD-10-CM | POA: Diagnosis present

## 2021-10-21 DIAGNOSIS — J90 Pleural effusion, not elsewhere classified: Secondary | ICD-10-CM | POA: Diagnosis present

## 2021-10-21 DIAGNOSIS — J690 Pneumonitis due to inhalation of food and vomit: Principal | ICD-10-CM | POA: Diagnosis present

## 2021-10-21 DIAGNOSIS — J441 Chronic obstructive pulmonary disease with (acute) exacerbation: Secondary | ICD-10-CM | POA: Diagnosis present

## 2021-10-21 DIAGNOSIS — Z20822 Contact with and (suspected) exposure to covid-19: Secondary | ICD-10-CM | POA: Diagnosis present

## 2021-10-21 DIAGNOSIS — J9811 Atelectasis: Secondary | ICD-10-CM | POA: Diagnosis present

## 2021-10-21 DIAGNOSIS — R52 Pain, unspecified: Secondary | ICD-10-CM

## 2021-10-21 DIAGNOSIS — I10 Essential (primary) hypertension: Secondary | ICD-10-CM | POA: Diagnosis present

## 2021-10-21 DIAGNOSIS — Z8619 Personal history of other infectious and parasitic diseases: Secondary | ICD-10-CM

## 2021-10-21 DIAGNOSIS — Z8249 Family history of ischemic heart disease and other diseases of the circulatory system: Secondary | ICD-10-CM

## 2021-10-21 DIAGNOSIS — F419 Anxiety disorder, unspecified: Secondary | ICD-10-CM | POA: Diagnosis present

## 2021-10-21 DIAGNOSIS — Z992 Dependence on renal dialysis: Secondary | ICD-10-CM | POA: Diagnosis not present

## 2021-10-21 DIAGNOSIS — R17 Unspecified jaundice: Secondary | ICD-10-CM | POA: Diagnosis present

## 2021-10-21 DIAGNOSIS — J9621 Acute and chronic respiratory failure with hypoxia: Secondary | ICD-10-CM | POA: Diagnosis present

## 2021-10-21 DIAGNOSIS — I120 Hypertensive chronic kidney disease with stage 5 chronic kidney disease or end stage renal disease: Secondary | ICD-10-CM | POA: Diagnosis present

## 2021-10-21 DIAGNOSIS — N2581 Secondary hyperparathyroidism of renal origin: Secondary | ICD-10-CM | POA: Diagnosis present

## 2021-10-21 DIAGNOSIS — E875 Hyperkalemia: Secondary | ICD-10-CM | POA: Diagnosis present

## 2021-10-21 DIAGNOSIS — Z887 Allergy status to serum and vaccine status: Secondary | ICD-10-CM

## 2021-10-21 DIAGNOSIS — Z79899 Other long term (current) drug therapy: Secondary | ICD-10-CM

## 2021-10-21 DIAGNOSIS — Z9181 History of falling: Secondary | ICD-10-CM

## 2021-10-21 DIAGNOSIS — M1990 Unspecified osteoarthritis, unspecified site: Secondary | ICD-10-CM | POA: Diagnosis present

## 2021-10-21 DIAGNOSIS — N186 End stage renal disease: Secondary | ICD-10-CM | POA: Diagnosis not present

## 2021-10-21 DIAGNOSIS — Z79890 Hormone replacement therapy (postmenopausal): Secondary | ICD-10-CM

## 2021-10-21 DIAGNOSIS — M898X9 Other specified disorders of bone, unspecified site: Secondary | ICD-10-CM | POA: Diagnosis present

## 2021-10-21 DIAGNOSIS — W19XXXA Unspecified fall, initial encounter: Secondary | ICD-10-CM | POA: Diagnosis present

## 2021-10-21 DIAGNOSIS — Z8673 Personal history of transient ischemic attack (TIA), and cerebral infarction without residual deficits: Secondary | ICD-10-CM

## 2021-10-21 DIAGNOSIS — S22000S Wedge compression fracture of unspecified thoracic vertebra, sequela: Secondary | ICD-10-CM

## 2021-10-21 DIAGNOSIS — J449 Chronic obstructive pulmonary disease, unspecified: Secondary | ICD-10-CM | POA: Diagnosis not present

## 2021-10-21 DIAGNOSIS — D631 Anemia in chronic kidney disease: Secondary | ICD-10-CM | POA: Diagnosis present

## 2021-10-21 LAB — LIPASE, BLOOD: Lipase: 23 U/L (ref 11–51)

## 2021-10-21 LAB — HEPATITIS C ANTIBODY: HCV Ab: NONREACTIVE

## 2021-10-21 LAB — SARS CORONAVIRUS 2 BY RT PCR: SARS Coronavirus 2 by RT PCR: NEGATIVE

## 2021-10-21 LAB — COMPREHENSIVE METABOLIC PANEL
ALT: 13 U/L (ref 0–44)
AST: 16 U/L (ref 15–41)
Albumin: 3 g/dL — ABNORMAL LOW (ref 3.5–5.0)
Alkaline Phosphatase: 185 U/L — ABNORMAL HIGH (ref 38–126)
Anion gap: 15 (ref 5–15)
BUN: 72 mg/dL — ABNORMAL HIGH (ref 8–23)
CO2: 23 mmol/L (ref 22–32)
Calcium: 8.7 mg/dL — ABNORMAL LOW (ref 8.9–10.3)
Chloride: 93 mmol/L — ABNORMAL LOW (ref 98–111)
Creatinine, Ser: 8.35 mg/dL — ABNORMAL HIGH (ref 0.61–1.24)
GFR, Estimated: 6 mL/min — ABNORMAL LOW (ref >60)
Glucose, Bld: 102 mg/dL — ABNORMAL HIGH (ref 70–99)
Potassium: 5.6 mmol/L — ABNORMAL HIGH (ref 3.5–5.1)
Sodium: 131 mmol/L — ABNORMAL LOW (ref 135–145)
Total Bilirubin: 1.3 mg/dL — ABNORMAL HIGH (ref 0.3–1.2)
Total Protein: 6.5 g/dL (ref 6.5–8.1)

## 2021-10-21 LAB — CBC WITH DIFFERENTIAL/PLATELET
Abs Immature Granulocytes: 0.07 10*3/uL (ref 0.00–0.07)
Basophils Absolute: 0 10*3/uL (ref 0.0–0.1)
Basophils Relative: 0 %
Eosinophils Absolute: 0.3 10*3/uL (ref 0.0–0.5)
Eosinophils Relative: 3 %
HCT: 31.2 % — ABNORMAL LOW (ref 39.0–52.0)
Hemoglobin: 10.2 g/dL — ABNORMAL LOW (ref 13.0–17.0)
Immature Granulocytes: 1 %
Lymphocytes Relative: 8 %
Lymphs Abs: 0.9 10*3/uL (ref 0.7–4.0)
MCH: 30.1 pg (ref 26.0–34.0)
MCHC: 32.7 g/dL (ref 30.0–36.0)
MCV: 92 fL (ref 80.0–100.0)
Monocytes Absolute: 0.9 10*3/uL (ref 0.1–1.0)
Monocytes Relative: 8 %
Neutro Abs: 8.6 10*3/uL — ABNORMAL HIGH (ref 1.7–7.7)
Neutrophils Relative %: 80 %
Platelets: 316 10*3/uL (ref 150–400)
RBC: 3.39 MIL/uL — ABNORMAL LOW (ref 4.22–5.81)
RDW: 17.2 % — ABNORMAL HIGH (ref 11.5–15.5)
WBC: 10.8 10*3/uL — ABNORMAL HIGH (ref 4.0–10.5)
nRBC: 0 % (ref 0.0–0.2)

## 2021-10-21 LAB — HEPATITIS B CORE ANTIBODY, TOTAL: Hep B Core Total Ab: NONREACTIVE

## 2021-10-21 LAB — HEPATITIS B SURFACE ANTIGEN: Hepatitis B Surface Ag: NONREACTIVE

## 2021-10-21 LAB — HEPATITIS B SURFACE ANTIBODY,QUALITATIVE: Hep B S Ab: NONREACTIVE

## 2021-10-21 LAB — TROPONIN I (HIGH SENSITIVITY)
Troponin I (High Sensitivity): 14 ng/L (ref <18)
Troponin I (High Sensitivity): 14 ng/L (ref <18)

## 2021-10-21 MED ORDER — IOHEXOL 350 MG/ML SOLN
70.0000 mL | Freq: Once | INTRAVENOUS | Status: AC | PRN
  Administered 2021-10-21: 70 mL via INTRAVENOUS

## 2021-10-21 MED ORDER — HYDROMORPHONE HCL 1 MG/ML IJ SOLN
1.0000 mg | Freq: Once | INTRAMUSCULAR | Status: AC
  Administered 2021-10-21: 1 mg via INTRAVENOUS
  Filled 2021-10-21: qty 1

## 2021-10-21 MED ORDER — SODIUM CHLORIDE 0.9 % IV SOLN
1.5000 g | Freq: Two times a day (BID) | INTRAVENOUS | Status: DC
  Administered 2021-10-22 – 2021-10-28 (×13): 1.5 g via INTRAVENOUS
  Filled 2021-10-21 (×16): qty 4

## 2021-10-21 MED ORDER — ONDANSETRON HCL 4 MG/2ML IJ SOLN
4.0000 mg | Freq: Four times a day (QID) | INTRAMUSCULAR | Status: DC | PRN
  Administered 2021-10-22 – 2021-10-23 (×2): 4 mg via INTRAVENOUS
  Filled 2021-10-21 (×2): qty 2

## 2021-10-21 MED ORDER — SODIUM CHLORIDE 0.9 % IV SOLN
1.0000 g | Freq: Once | INTRAVENOUS | Status: DC
  Administered 2021-10-21: 1 g via INTRAVENOUS
  Filled 2021-10-21: qty 10

## 2021-10-21 MED ORDER — SODIUM CHLORIDE 0.9 % IV SOLN: 500.0000 mg | Freq: Once | INTRAVENOUS | Status: DC

## 2021-10-21 MED ORDER — ONDANSETRON HCL 4 MG/2ML IJ SOLN
4.0000 mg | Freq: Once | INTRAMUSCULAR | Status: AC
  Administered 2021-10-21: 4 mg via INTRAVENOUS
  Filled 2021-10-21: qty 2

## 2021-10-21 MED ORDER — HYDROMORPHONE HCL 1 MG/ML IJ SOLN
0.5000 mg | INTRAMUSCULAR | Status: DC | PRN
  Administered 2021-10-22 (×2): 1 mg via INTRAVENOUS
  Filled 2021-10-21 (×2): qty 1

## 2021-10-21 MED ORDER — FENTANYL CITRATE PF 50 MCG/ML IJ SOSY
50.0000 ug | PREFILLED_SYRINGE | Freq: Once | INTRAMUSCULAR | Status: AC
  Administered 2021-10-21: 50 ug via INTRAVENOUS
  Filled 2021-10-21: qty 1

## 2021-10-21 MED ORDER — HYDROMORPHONE HCL 1 MG/ML IJ SOLN
0.5000 mg | Freq: Once | INTRAMUSCULAR | Status: AC
  Administered 2021-10-21: 0.5 mg via INTRAVENOUS
  Filled 2021-10-21: qty 1

## 2021-10-21 MED ORDER — CHLORHEXIDINE GLUCONATE CLOTH 2 % EX PADS
6.0000 | MEDICATED_PAD | Freq: Every day | CUTANEOUS | Status: DC
Start: 2021-10-22 — End: 2021-10-22

## 2021-10-21 NOTE — ED Notes (Signed)
Patient taken to dialysis. 

## 2021-10-21 NOTE — ED Notes (Signed)
Pt unable to tolerate lying flat for CT d/t pain and pt reporting he can't breathe d/t pain.

## 2021-10-21 NOTE — Progress Notes (Signed)
Pharmacy Antibiotic Note  Wayne Meadows is a 87 y.o. male admitted on 10/21/2021 with aspiration pneumonia. Pt has ESRD on HD MWF. Pharmacy has been consulted for Unasyn dosing.  Pt received Ceftriaxone 1g IV x1 on 8/29 19:37 in the ED.  Plan: Initiate Unasyn (ampicillin-sulbactam) 1.5g IV q12h Monitor daily CBC, temp, SCr, and for clinical signs of improvement  F/u cultures and de-escalate antibiotics as able   Height: '6\' 2"'$  (188 cm) Weight: 77.1 kg (170 lb) IBW/kg (Calculated) : 82.2  Temp (24hrs), Avg:98.4 F (36.9 C), Min:98 F (36.7 C), Max:98.8 F (37.1 C)  Recent Labs  Lab 10/21/21 1554  WBC 10.8*  CREATININE 8.35*    Estimated Creatinine Clearance: 7.2 mL/min (A) (by C-G formula based on SCr of 8.35 mg/dL (H)).    Allergies  Allergen Reactions   Bee Venom Anaphylaxis    Has epi pen   Influenza Vaccines Other (See Comments)    "Mortally sick for 2 weeks"    Antimicrobials this admission: Ceftriaxone 8/29 x1 Unasyn 8/29 >>   Dose adjustments this admission: N/A  Microbiology results: 8/29 BCx: pending  Thank you for allowing pharmacy to be a part of this patient's care.  Luisa Hart, PharmD, BCPS Clinical Pharmacist 10/21/2021 8:16 PM   Please refer to [facility] for pharmacy phone number

## 2021-10-21 NOTE — H&P (Signed)
History [facility] Physical    [facility] WGN:[id number] DOB: [birth year] DOA: 10/21/2021  PCP: [facility]   Patient coming from: SNF   Chief Complaint: Severe back pain, missed HD, increased oxygen requirement   HPI: [facility] FECKO is a 87 y.o. male with medical history significant for paroxysmal atrial fibrillation not anticoagulated, CAD, hypothyroidism, COPD with chronic hypoxic respiratory failure, [place], [place], [place], [place], [place], [place], [place] increased cough Meadows is requiring increased FiO2.  M[facility]ows missed dialysis today due to pain.  ED Course: Upon arrival to the ED, patient is found to be afebrile Meadows saturating mid 90s on 4 L/min of supplemental oxygen with mild tachypnea Meadows stable blood pressure.  EKG features sinus rhythm Meadows chest x-ray notable for COPD changes Meadows bibasilar airspace disease.  CTA chest is negative for PE but notable for new T5 Meadows T7 compression fractures, the previously identified rib fractures, Meadows bibasilar airspace disease with material in the lower lobe bronchi suspicious for aspiration pneumonia.  Patient Meadows treated with antibiotics, multiple doses of IV Dilaudid, nephrology Meadows consulted by the ED physician, Meadows the patient Meadows taken for inpatient dialysis.  Review of Systems:  All other systems reviewed Meadows apart from HPI, are negative.  Past Medical History:  Diagnosis Date   Abnormal echocardiogram 03/08/2021   Abnormal finding on GI tract imaging    Acute blood loss anemia    Acute pancreatitis 08/12/2020   Arthritis    Carotid stenosis    Community acquired pneumonia  04/06/2021   COPD (chronic obstructive pulmonary disease) (HCC)    Coronary artery disease    S/p PCI 2011;  NSTEMI 12/12:  LHC/PCI 02/23/11: LAD 60% after the septal perforator, D1 occluded with distal collaterals, proximal RI 30-40%, AV circumflex stent patent with 60% stenosis after the stent, RCA 99%, EF 60-65%.  His RCA Meadows treated with a bare-metal stent   CVA (cerebral infarction) 2011   Right cerebral; total obstruction of the right ICA   Diverticulitis    Hypertension    Paroxysmal atrial fibrillation with RVR (HCC) 04/09/2021   Pleuritic chest pain 04/08/2014   Pressure injury of skin 03/30/2021   Rash 03/05/2021   Rectal bleeding 07/2015   Refractory nausea Meadows vomiting 06/20/2020   Renal carcinoma (HCC)    Sepsis, unspecified organism (HCC) 04/11/2016   Stroke (HCC)    Tobacco abuse, in remission     Past Surgical History:  Procedure Laterality Date   AV FISTULA PLACEMENT Right 04/03/2021   Procedure: ARTERIOVENOUS (AV) CREATION OF RIGHT ARM BRACHIOCEPHALIC FISTULA;  Surgeon: Leonie Douglas, MD;  [facility];  Service: Vascular;  Laterality: Right;   BIOPSY  04/08/2021   Procedure: BIOPSY;  Surgeon: Shellia Cleverly, DO;  Location: [facility];  Service: Gastroenterology;;   COLON SURGERY     COLONOSCOPY N/A 08/22/2015   Procedure: COLONOSCOPY;  Surgeon: Napoleon Form, MD;  Location: [facility];  Service: Endoscopy;  Laterality: N/A;   ESOPHAGOGASTRODUODENOSCOPY N/A 07/18/2020   Procedure: ESOPHAGOGASTRODUODENOSCOPY (EGD);  Surgeon: Rachael Fee, MD;  Location: [facility];  Service: Endoscopy;  Laterality: N/A;   ESOPHAGOGASTRODUODENOSCOPY (EGD) WITH PROPOFOL N/A 06/21/2020   Procedure: ESOPHAGOGASTRODUODENOSCOPY (EGD) WITH PROPOFOL;  Surgeon: Hilarie Fredrickson, MD;  Location: [facility] ENDOSCOPY;  Service: Endoscopy;  Laterality: N/A;   ESOPHAGOGASTRODUODENOSCOPY (EGD) WITH PROPOFOL N/A 04/08/2021   Procedure: ESOPHAGOGASTRODUODENOSCOPY (EGD) WITH PROPOFOL;  Surgeon:  Shellia Cleverly, DO;  Location: [facility];  Service: Gastroenterology;  Laterality: N/A;   EUS N/A 07/18/2020   Procedure: UPPER ENDOSCOPIC ULTRASOUND (EUS) RADIAL;  Surgeon: Rachael Fee, MD;  Location: [facility];  Service: Endoscopy;  Laterality: N/A;   FINE NEEDLE ASPIRATION N/A 07/18/2020   Procedure: FINE NEEDLE ASPIRATION (FNA) LINEAR;  Surgeon: Rachael Fee, MD;  Location: [facility];  Service: Endoscopy;  Laterality: N/A;   HEMOSTASIS CLIP PLACEMENT  04/08/2021   Procedure: HEMOSTASIS CLIP PLACEMENT;  Surgeon: Shellia Cleverly, DO;  Location: [facility];  Service: Gastroenterology;;   hip relacement     INCISION [facility] DRAINAGE Right 09/11/2021   Procedure: INCISION [facility] DRAINAGE OF RIGHT ARM FISTULA;  Surgeon: Leonie Douglas, MD;  Location: [facility];  Service: Vascular;  Laterality: Right;   IR FLUORO GUIDE CV LINE RIGHT  03/31/2021   IR REMOVAL TUN CV CATH W/O FL  08/15/2021   IR US GUIDE VASC ACCESS RIGHT  03/31/2021   KIDNEY SURGERY     LEFT HEART CATH [place] CORONARY ANGIOGRAPHY N/A 07/09/2020   Procedure: LEFT HEART CATH [place] CORONARY ANGIOGRAPHY;  Surgeon: Marykay Lex, MD;  Location: [facility] INVASIVE CV LAB;  Service: Cardiovascular;  Laterality: N/A;   LEFT HEART CATHETERIZATION WITH CORONARY ANGIOGRAM N/A 02/23/2011   Procedure: LEFT HEART CATHETERIZATION WITH CORONARY ANGIOGRAM;  Surgeon: Wendall Stade, MD;  Location: [facility] CATH LAB;  Service: Cardiovascular;  Laterality: N/A;   LEFT HEART CATHETERIZATION WITH CORONARY ANGIOGRAM N/A 03/18/2011   Procedure: LEFT HEART CATHETERIZATION WITH CORONARY ANGIOGRAM;  Surgeon: Laurey Morale, MD;  Location: [facility] CATH LAB;  Service: Cardiovascular;  Laterality: N/A;   PERCUTANEOUS CORONARY STENT INTERVENTION (PCI-S) N/A 02/23/2011   Procedure: PERCUTANEOUS CORONARY STENT INTERVENTION (PCI-S);  Surgeon: Wendall Stade, MD;  Location: [facility] CATH LAB;  Service: Cardiovascular;  Laterality: N/A;   TEMPORARY PACEMAKER INSERTION N/A 02/23/2011    Procedure: TEMPORARY PACEMAKER INSERTION;  Surgeon: Wendall Stade, MD;  Location: [facility] CATH LAB;  Service: Cardiovascular;  Laterality: N/A;   THROMBECTOMY W/ EMBOLECTOMY Right 04/03/2021   Procedure: THROMBECTOMY ARTERIOVENOUS FISTULA;  Surgeon: Leonie Douglas, MD;  Location: [facility];  Service: Vascular;  Laterality: Right;    Social History:   reports that Meadows quit smoking about 14 years Meadows. His smoking use included cigarettes. Meadows has never used smokeless tobacco. Meadows reports that Meadows does not currently use alcohol after a past usage of about 1.0 standard drink of alcohol per week. Meadows reports that Meadows does not use drugs.  Allergies  Allergen Reactions   Bee Venom Anaphylaxis    Has epi pen   Influenza Vaccines Other (See Comments)    "Mortally sick for 2 weeks"    Family History  Problem Relation Age of Onset   Heart attack Other 54     Prior to Admission medications   Medication Sig Start Date End Date Taking? Authorizing Provider  Amino Acids-Protein Hydrolys (PRO-STAT SUGAR FREE PO) Take 30 mLs by mouth every evening.   Yes [provider]  busPIRone (BUSPAR) 5 MG tablet Take 5 mg by mouth 2 (two) times daily. (0800 & 2000)   Yes [provider]  Ipratropium-Albuterol (COMBIVENT) 20-100 MCG/ACT AERS respimat Inhale 1 puff into the lungs 4 (four) times daily. (0900, 1300, 1700 & 2100)   Yes [provider]  ipratropium-albuterol (DUONEB) 0.5-2.5 (3) MG/3ML SOLN Take 3 mLs by nebulization every 4 (four) hours as needed (shortness of breath/wheezing).   Yes [provider]  levothyroxine (SYNTHROID) 50 MCG tablet Take 50 mcg by mouth daily before breakfast. (0630)   Yes [provider]  LIDOCAINE PAIN RELIEF 4 % Place 1 patch onto the skin daily. Apply 1 patch to back in the morning Meadows remove at bedtime for pain. 10/17/21  Yes [provider]  melatonin 5 MG TABS Take 5 mg by mouth at bedtime. 09/30/21  Yes [provider]   metoprolol tartrate (LOPRESSOR) 25 MG tablet Take 0.5 tablets (12.5 mg total) by mouth 2 (two) times daily. 09/30/21  Yes Lonia Blood, MD  naphazoline-glycerin (CLEAR EYES REDNESS) 0.012-0.25 % SOLN Place 1-2 drops into the right eye 3 (three) times daily. 04/23/21  Yes Briant Cedar, MD  neomycin-bacitracin-polymyxin (NEOSPORIN) OINT Apply 1 application topically 3 (three) times daily. To right antecubital area 04/10/21  Yes Osvaldo Shipper, MD  Nutritional Supplements (NOVASOURCE RENAL) LIQD Take 237 mLs by mouth in the morning Meadows at bedtime. (0800 & 1700)   Yes [provider]  oxyCODONE (OXY IR/ROXICODONE) 5 MG immediate release tablet Take 1 tablet (5 mg total) by mouth every 6 (six) hours as needed for severe pain. Patient taking differently: Take 5 mg by mouth every 4 (four) hours as needed for severe pain. 09/30/21  Yes Lonia Blood, MD  pravastatin (PRAVACHOL) 40 MG tablet Take 40 mg by mouth at bedtime. (2100)   Yes [provider]  senna (SENOKOT) 8.6 MG tablet Take 2 tablets by mouth at bedtime. 10/15/21  Yes [provider]  tamsulosin (FLOMAX) 0.4 MG CAPS capsule Take 1 capsule (0.4 mg total) by mouth daily. 04/11/21  Yes Osvaldo Shipper, MD  acetaminophen (TYLENOL) 325 MG tablet Take 2 tablets (650 mg total) by mouth every 6 (six) hours as needed for mild pain ([place]ows Fever >/= 101). Patient not taking: Reported on 10/21/2021 04/10/21   Osvaldo Shipper, MD  camphor-menthol [facility]) lotion Apply topically as needed for itching. Patient not taking: Reported on 10/21/2021 04/23/21   Briant Cedar, MD  cefdinir (OMNICEF) 300 MG capsule Take 1 capsule (300 mg total) by mouth daily. Patient not taking: Reported on 10/21/2021 10/01/21   Lonia Blood, MD  Darbepoetin Alfa (ARANESP) 60 MCG/0.3ML SOSY injection Inject 0.3 mLs (60 mcg total) into the vein every Monday with Meadows. 09/30/21   Lonia Blood, MD  diltiazem (CARDIZEM CD) 240 MG 24 hr  capsule Take 1 capsule (240 mg total) by mouth daily. Patient not taking: Reported on 10/21/2021 10/01/21   Lonia Blood, MD  hydrOXYzine (ATARAX) 10 MG tablet Take 10 mg by mouth every 6 (six) hours as needed for itching. Patient not taking: Reported on 10/21/2021    [provider]  lanthanum (FOSRENOL) 1000 MG chewable tablet Chew 1 tablet (1,000 mg total) by mouth 3 (three) times daily with meals. Patient not taking: Reported on 10/21/2021 04/04/21   Azucena Fallen, MD  Menthol, Topical Analgesic, (BIOFREEZE) 4 % GEL Apply 1 Application topically 3 (three) times daily as needed (pain). Patient not taking: Reported on 10/21/2021    [provider]  Multiple Vitamin (MULTIVITAMIN WITH MINERALS) TABS tablet Take 1 tablet by mouth daily. (0900) Patient  not taking: Reported on 10/21/2021    [provider]  Multiple Vitamins-Minerals (DECUBI-VITE PO) Take 1 tablet by mouth in the morning. (0900) Patient not taking: Reported on 10/21/2021    [provider]  ondansetron (ZOFRAN) 4 MG tablet Take 1 tablet (4 mg total) by mouth daily as needed for nausea Meadows vomiting. Patient not taking: Reported on 10/21/2021 04/11/21 04/11/22  Osvaldo Shipper, MD  pantoprazole (PROTONIX) 40 MG tablet Take 1 tablet (40 mg total) by mouth 2 (two) times daily. Patient not taking: Reported on 10/21/2021 04/10/21   Osvaldo Shipper, MD  polyethylene glycol (MIRALAX / GLYCOLAX) 17 g packet Take 17 g by mouth daily. Patient not taking: Reported on 10/21/2021 05/01/21   Pricilla Loveless, MD    Physical Exam: Vitals:   10/22/21 0000 10/22/21 0018 10/22/21 0030 10/22/21 0139  BP: (!) 156/66 (!) 148/61 (!) 142/70 (!) 145/57  Pulse: (!) 111 (!) 106 (!) 131 (!) 59  Resp: 14 17 12 20   Temp:  98.5 F (36.9 C) 98.2 F (36.8 C) 98.4 F (36.9 C)  TempSrc:  Oral Oral Oral  SpO2: 91% 90% 99% 95%  Weight:    76.4 kg  Height:    6\' 2"  (1.88 m)    Constitutional: NAD, calm  Eyes: PERTLA, lids  Meadows conjunctivae normal ENMT: Mucous membranes are moist. Posterior pharynx clear of any exudate Meadows lesions.   Neck: supple, no masses  Respiratory: Rhonchi b/l, no wheezing. No accessory muscle use.  Cardiovascular: S1 & S2 heard, regular rate Meadows rhythm. No significant JVD. Abdomen: No distension, no tenderness, soft. Bowel sounds active.  Musculoskeletal: no clubbing / cyanosis. No joint deformity upper Meadows lower extremities.   Skin: no significant rashes, lesions, ulcers. Warm, dry, well-perfused. Neurologic: CN 2-12 grossly intact. Sensation to light touch intact. Strength 5/5 in all 4 limbs. Alert Meadows oriented.  Psychiatric: Pleasant. Cooperative.    Labs [facility] Imaging on Admission: I have personally reviewed following labs Meadows imaging studies  CBC: Recent Labs  Lab 10/21/21 1554  WBC 10.8*  NEUTROABS 8.6*  HGB 10.2*  HCT 31.2*  MCV 92.0  PLT 316   Basic Metabolic Panel: Recent Labs  Lab 10/21/21 1554  NA 131*  K 5.6*  CL 93*  CO2 23  GLUCOSE 102*  BUN 72*  CREATININE 8.35*  CALCIUM 8.7*   GFR: Estimated Creatinine Clearance: 7.1 mL/min (A) (by C-G formula based on SCr of 8.35 mg/dL (H)). Liver Function Tests: Recent Labs  Lab 10/21/21 1554  AST 16  ALT 13  ALKPHOS 185*  BILITOT 1.3*  PROT 6.5  ALBUMIN 3.0*   Recent Labs  Lab 10/21/21 1554  LIPASE 23   No results for input(s): "AMMONIA" in the last 168 hours. Coagulation Profile: No results for input(s): "INR", "PROTIME" in the last 168 hours. Cardiac Enzymes: No results for input(s): "CKTOTAL", "CKMB", "CKMBINDEX", "TROPONINI" in the last 168 hours. BNP (last 3 results) No results for input(s): "PROBNP" in the last 8760 hours. HbA1C: No results for input(s): "HGBA1C" in the last 72 hours. CBG: No results for input(s): "GLUCAP" in the last 168 hours. Lipid Profile: No results for input(s): "CHOL", "HDL", "LDLCALC", "TRIG", "CHOLHDL", "LDLDIRECT" in the last 72 hours. Thyroid Function Tests: No  results for input(s): "TSH", "T4TOTAL", "FREET4", "T3FREE", "THYROIDAB" in the last 72 hours. Anemia Panel: No results for input(s): "VITAMINB12", "FOLATE", "FERRITIN", "TIBC", "IRON", "RETICCTPCT" in the last 72 hours. Urine analysis:    Component Value Date/Time   COLORURINE YELLOW 05/01/2021  1446   APPEARANCEUR TURBID (A) 05/01/2021 1446   LABSPEC 1.025 05/01/2021 1446   PHURINE 5.0 05/01/2021 1446   GLUCOSEU NEGATIVE 05/01/2021 1446   HGBUR LARGE (A) 05/01/2021 1446   BILIRUBINUR NEGATIVE 05/01/2021 1446   KETONESUR 5 (A) 05/01/2021 1446   PROTEINUR >=300 (A) 05/01/2021 1446   NITRITE NEGATIVE 05/01/2021 1446   LEUKOCYTESUR MODERATE (A) 05/01/2021 1446   Sepsis Labs: @LABRCNTIP (procalcitonin:4,lacticidven:4) ) Recent Results (from the past 240 hour(s))  SARS Coronavirus 2 by RT PCR (hospital order, performed in [facility] hospital lab) *cepheid single result test* Anterior Nasal Swab     Status: None   Collection Time: 10/21/21  3:54 PM   Specimen: Anterior Nasal Swab  Result Value Ref Range Status   SARS Coronavirus 2 by RT PCR NEGATIVE NEGATIVE Final    Comment: (NOTE) SARS-CoV-2 target nucleic acids are NOT DETECTED.  The SARS-CoV-2 RNA is generally detectable in upper Meadows lower respiratory specimens during the acute phase of infection. The lowest concentration of SARS-CoV-2 viral copies this assay can detect is 250 copies / mL. A negative result does not preclude SARS-CoV-2 infection Meadows should not be used as the sole basis for treatment Meadows other patient management decisions.  A negative result may occur with improper specimen collection / handling, submission of specimen other than nasopharyngeal swab, presence of viral mutation(s) within the areas targeted by this assay, Meadows inadequate number of viral copies (<250 copies / mL). A negative result must be combined with clinical observations, patient history, Meadows epidemiological information.  Fact Sheet for Patients:    RoadLapTop.co.za  Fact Sheet for Healthcare Providers: http://kim-miller.com/  This test is not yet approved Meadows  cleared by the [place] FDA Meadows has been authorized for detection Meadows/Meadows diagnosis of SARS-CoV-2 by FDA under an Emergency Use Authorization (EUA).  This EUA will remain in effect (meaning this test can be used) for the duration of the COVID-19 declaration under Section 564(b)(1) of the Act, 21 U.S.C. section 360bbb-3(b)(1), unless the authorization is terminated Meadows revoked sooner.  Performed at [facility], [address], [place], [place] [address]      Radiological Exams on Admission: CT Angio Chest PE W Meadows/M[place]ows Wo Contrast  Result Date: 10/21/2021 CLINICAL DATA:  87 year old with history of worsening RIGHT-sided pleuritic chest pain presents for evaluation for pulmonary embolism. EXAM: CT ANGIOGRAPHY CHEST WITH CONTRAST TECHNIQUE: Multidetector CT imaging of the chest Meadows performed using the standard protocol during bolus administration of intravenous contrast. Multiplanar CT image reconstructions Meadows MIPs were obtained to evaluate the vascular anatomy. RADIATION DOSE REDUCTION: This exam Meadows performed according to the departmental dose-optimization program which includes automated exposure control, adjustment of the mA Meadows/Meadows kV according to patient size Meadows/Meadows use of iterative reconstruction technique. CONTRAST:  70mL OMNIPAQUE IOHEXOL 350 MG/ML SOLN COMPARISON:  August 15, 2021. FINDINGS: Cardiovascular: Calcified aortic atherosclerosis also with noncalcified atherosclerotic plaque. Normal caliber of the thoracic aorta without acute process. Heart size is top normal. No pericardial effusion Meadows nodularity. Three-vessel coronary artery disease. Extensive coronary artery calcifications. Central pulmonary vasculature opacified to 236 Hounsfield units, moderate study quality. The study is negative for pulmonary embolism to the  segmental level. Subsegmental branches are not well evaluated due to bolus timing Mediastinum/Nodes: No acute process Meadows signs of adenopathy in the mediastinum. Lungs/Pleura: Signs of pulmonary emphysema. No pneumothorax. Pulmonary emphysema is moderate to marked Meadows worse at the lung apices. Small LEFT effusion Meadows LEFT basilar airspace disease. Material in LEFT lower  lobe bronchi Meadows mild bronchial wall thickening. Small Bochdalek's hernia containing fat on the RIGHT. Basilar atelectasis mixed with airspace disease Meadows material in RIGHT lower lobe bronchi as well. Bronchial wall thickening to the RIGHT lung base. Upper Abdomen: Incidental imaging of upper abdominal contents without acute process. Musculoskeletal: Signs of spinal fusion. Osteopenia. Spinal fusion in the upper lumbar spine is incompletely assessed. Subacute rib fractures in the bilateral chest without change compared to previous imaging. Visualized clavicles Meadows scapulae as well as the sternum are unremarkable. Signs of recent compression fractures in the thoracic spine which have occurred since August 15, 2021 showing T5 with 50-60% loss of height along the anterior endplate. No signs of central canal impingement Meadows posterior retropulsion of fracture elements. Sclerosis at this level suggests this may be subacute. Inferior endplate of T7 is fractured leading to approximately 40% loss of height at this level. Kyphotic angulation at the thoracolumbar transition is similar to previous imaging. This area Meadows the fusion are incompletely assessed. Review of the MIP images confirms the above findings. IMPRESSION: 1. No evidence of pulmonary embolism to the segmental level. 2. New T5 Meadows T7 compression fractures. T5 may be subacute but both appear recent Meadows are new since previous imaging. Correlate with pain in these areas. 3. Bilateral subacute rib fractures with similar appearance to recent imaging. 4. Bibasilar airspace disease Meadows material within lower  lobe bronchi suspicious for developing aspiration related pneumonia. 5. Pulmonary emphysema Meadows aortic atherosclerosis. 6. Three-vessel coronary artery disease. Aortic Atherosclerosis (ICD10-I70.0) Meadows Emphysema (ICD10-J43.9). Electronically Signed   By: Donzetta Kohut M.D.   On: 10/21/2021 18:44   VAS [place] LOWER EXTREMITY VENOUS (DVT) (ONLY [facility])  Result Date: 10/21/2021  Lower Venous DVT Study Patient Name:  CHUKWUKA DWORAK  Date of Exam:   10/21/2021 Medical Rec #: [id number]         Accession #:    [id number] Date of Birth: [birth year]        Patient Gender: M Patient Age:   82 years Exam Location[facility]  [facility] Procedure:      VAS [place] LOWER EXTREMITY VENOUS (DVT) Referring Phys: Lynden Oxford --------------------------------------------------------------------------------  Indications: Pain.  Comparison Study: 03/08/21 prior Performing Technologist: [place] Ponder RVS  Examination Guidelines: A complete evaluation includes B-mode imaging, spectral Doppler, color Doppler, Meadows power Doppler as needed of all accessible portions of each vessel. Bilateral testing is considered an integral part of a complete examination. Limited examinations for reoccurring indications may be performed as noted. The reflux portion of the exam is performed with the patient in reverse Trendelenburg.  +-----+---------------+---------+-----------+----------+--------------+ RIGHTCompressibilityPhasicitySpontaneityPropertiesThrombus Aging +-----+---------------+---------+-----------+----------+--------------+ CFV  Full           Yes      Yes                                 +-----+---------------+---------+-----------+----------+--------------+   +---------+---------------+---------+-----------+----------+--------------+ LEFT     CompressibilityPhasicitySpontaneityPropertiesThrombus Aging +---------+---------------+---------+-----------+----------+--------------+ CFV      Full           Yes      Yes                                  +---------+---------------+---------+-----------+----------+--------------+ SFJ      Full                                                        +---------+---------------+---------+-----------+----------+--------------+  FV Prox  Full                                                        +---------+---------------+---------+-----------+----------+--------------+ FV Mid   Full                                                        +---------+---------------+---------+-----------+----------+--------------+ FV DistalFull                                                        +---------+---------------+---------+-----------+----------+--------------+ PFV      Full                                                        +---------+---------------+---------+-----------+----------+--------------+ POP      Full           Yes      Yes                                 +---------+---------------+---------+-----------+----------+--------------+ PTV      Full                                                        +---------+---------------+---------+-----------+----------+--------------+ PERO     Full                                                        +---------+---------------+---------+-----------+----------+--------------+     Summary: RIGHT: - No evidence of common femoral vein obstruction.  LEFT: - There is no evidence of deep vein thrombosis in the lower extremity.  - No cystic structure found in the popliteal fossa.  *See table(s) above for measurements Meadows observations. Electronically signed by Waverly Ferrari MD on 10/21/2021 at 4:55:40 PM.    Final    DG Chest Portable 1 View  Result Date: 10/21/2021 CLINICAL DATA:  Chest pain short of breath EXAM: PORTABLE CHEST 1 VIEW COMPARISON:  Chest 09/28/2021 FINDINGS: COPD with hyperinflation. Progression of bibasilar airspace disease. Possible pneumonia versus atelectasis  Negative for heart failure.  Small left effusion IMPRESSION: COPD with bibasilar airspace disease, possible pneumonia. Small left pleural effusion. Electronically Signed   By: Marlan Palau M.D.   On: 10/21/2021 16:21    EKG: Independently reviewed. SR, 1st AV block.   Assessment/Plan   1. Aspiration pneumonia; acute on chronic hypoxic respiratory failure  - Requiring 4 Lpm supplemental O2 in ED, up from his usual  2.5 Lpm, Meadows is found to have bibasilar infiltrates on CT Meadows material in lower bronchi suspicious for aspiration pneumonia  - Culture blood Meadows sputum, treat with Unasyn, continue supplemental O2 as needed, keep NPO pending SLP evaluation    2. ESRD; hyperkalemia  - Potassium slightly elevated after missing HD today d/t pain  - Appreciate nephrology consultation, patient dialyzed tonight  - Renally-dose medications, restrict fluids    3. Vertebral compression fractures; rib fractures  - Patient has known b/l rib fractures from fall ~2 wks Meadows, complains of severe mid back pain Meadows noted on CT to have compression fxs involving T5 Meadows T7  - Continue pain-control, incentive spirometry, PT    4. PAF  - Not anticoagulated d/t falls, GIB, Meadows hemorrhagic renal cyst  - Continue metoprolol Meadows diltiazem    5. CAD  - No anginal complaints  - Continue statin Meadows beta-blocker    6. COPD  - No wheezing on admission  - Continue Duonebs as needed    DVT prophylaxis: sq heparin  Code Status: Full  Level of Care: Level of care: Telemetry Medical Family Communication: None present  Disposition Plan:  Patient is from: SNF  Anticipated d/c is to: SNF  Anticipated d/c date is: 10/24/21  Patient currently: Pending improvement in respiratory status, pain-control, SLP eval, transition to oral medications   Consults called: Nephrology  Admission status: Inpatient     Briscoe Deutscher, MD [facility]  10/22/2021, 1:55 AM

## 2021-10-21 NOTE — ED Triage Notes (Signed)
Pt arrived to ED via EMS from [facility] d/t missing dialysis yesterday and c/o abd pain and mid back and R rib cage and distention. Pt also c/o back pain x 1 week after a fall 2 weeks. Pt MWF dialysis pt. On 4L O2 via [place] at baseline. VSS w/ EMS.   Pt reports he didn't go to dialysis yesterday d/t his back pain.

## 2021-10-21 NOTE — ED Provider Notes (Signed)
[facility] EMERGENCY DEPARTMENT Provider Note   CSN: [id number] Arrival date & time: 10/21/21  1450     History  Chief Complaint  Patient presents with   Missed Dialysis   Abdominal Pain   Back Pain    Wayne Meadows is a 87 y.o. male.  The history is provided by the patient and medical records. No language interpreter was used.  Back Pain Location:  Thoracic spine Quality:  Aching Radiates to: R chest. Pain severity:  Severe Onset quality:  Gradual Duration:  1 week Timing:  Constant Progression:  Waxing and waning Chronicity:  New Relieved by:  Nothing Worsened by:  Deep breathing and coughing Ineffective treatments:  None tried Associated symptoms: chest pain   Associated symptoms: no abdominal pain, no fever, no headaches, no numbness and no weakness   Chest Pain Pain location:  R lateral chest and R chest Pain quality: aching and sharp   Pain radiates to:  Upper back Pain severity:  Severe Onset quality:  Gradual Duration:  1 week Timing:  Constant Progression:  Waxing and waning Chronicity:  New Context: trauma (2 weeks ago)   Relieved by:  Nothing Worsened by:  Coughing, deep breathing and movement Ineffective treatments:  None tried Associated symptoms: back pain, cough, fatigue, lower extremity edema, nausea and shortness of breath   Associated symptoms: no abdominal pain, no altered mental status, no diaphoresis, no dizziness, no fever, no headache, no numbness, no palpitations, no vomiting and no weakness   Risk factors: hypertension, immobilization and male sex   Risk factors: no prior DVT/PE        Home Medications Prior to Admission medications   Medication Sig Start Date End Date Taking? Authorizing Provider  acetaminophen (TYLENOL) 325 MG tablet Take 2 tablets (650 mg total) by mouth every 6 (six) hours as needed for mild pain (or Fever >/= 101). 04/10/21   Bonnielee Haff, MD  aspirin EC 81 MG tablet Take 81 mg by mouth  daily.  (0900)    [provider]  bisacodyl (DULCOLAX) 10 MG suppository Place 10 mg rectally daily as needed for moderate constipation.    [provider]  busPIRone (BUSPAR) 5 MG tablet Take 5 mg by mouth 2 (two) times daily. (0800 & 2000)    [provider]  camphor-menthol (SARNA) lotion Apply topically as needed for itching. 04/23/21   Alma Friendly, MD  cefdinir (OMNICEF) 300 MG capsule Take 1 capsule (300 mg total) by mouth daily. 10/01/21   Cherene Altes, MD  Darbepoetin Alfa (ARANESP) 60 MCG/0.3ML SOSY injection Inject 0.3 mLs (60 mcg total) into the vein every Monday with hemodialysis. 09/30/21   Cherene Altes, MD  diltiazem (CARDIZEM CD) 240 MG 24 hr capsule Take 1 capsule (240 mg total) by mouth daily. 10/01/21   Cherene Altes, MD  ferrous sulfate 325 (65 FE) MG tablet Take 1 tablet by mouth every Monday, Wednesday, and Friday. (0900) 07/30/20   [provider]  hydrOXYzine (ATARAX) 10 MG tablet Take 10 mg by mouth every 6 (six) hours as needed for itching.    [provider]  Ipratropium-Albuterol (COMBIVENT) 20-100 MCG/ACT AERS respimat Inhale 1 puff into the lungs 4 (four) times daily. (0900, 1300, 1700 & 2100)    [provider]  ipratropium-albuterol (DUONEB) 0.5-2.5 (3) MG/3ML SOLN Take 3 mLs by nebulization every 4 (four) hours as needed (shortness of breath/wheezing).    [provider]  lanthanum (FOSRENOL) 1000 MG  chewable tablet Chew 1 tablet (1,000 mg total) by mouth 3 (three) times daily with meals. 04/04/21   Little Ishikawa, MD  levothyroxine (SYNTHROID) 50 MCG tablet Take 50 mcg by mouth daily before breakfast. (0630)    [provider]  melatonin 5 MG TABS Take 5 mg by mouth at bedtime as needed (sleep).    [provider]  Menthol, Topical Analgesic, (BIOFREEZE) 4 % GEL Apply 1 Application topically 3 (three) times daily as needed (pain).    [provider]   metoprolol tartrate (LOPRESSOR) 25 MG tablet Take 0.5 tablets (12.5 mg total) by mouth 2 (two) times daily. 09/30/21   Cherene Altes, MD  Multiple Vitamin (MULTIVITAMIN WITH MINERALS) TABS tablet Take 1 tablet by mouth daily. (0900)    [provider]  Multiple Vitamins-Minerals (DECUBI-VITE PO) Take 1 tablet by mouth in the morning. (0900)    [provider]  naphazoline-glycerin (CLEAR EYES REDNESS) 0.012-0.25 % SOLN Place 1-2 drops into the right eye 3 (three) times daily. 04/23/21   Alma Friendly, MD  neomycin-bacitracin-polymyxin (NEOSPORIN) OINT Apply 1 application topically 3 (three) times daily. To right antecubital area 04/10/21   Bonnielee Haff, MD  Nutritional Supplements (NOVASOURCE RENAL) LIQD Take 237 mLs by mouth in the morning and at bedtime. (0800 & 1700)    [provider]  ondansetron (ZOFRAN) 4 MG tablet Take 1 tablet (4 mg total) by mouth daily as needed for nausea or vomiting. 04/11/21 04/11/22  Bonnielee Haff, MD  oxyCODONE (OXY IR/ROXICODONE) 5 MG immediate release tablet Take 1 tablet (5 mg total) by mouth every 6 (six) hours as needed for severe pain. 09/30/21   Cherene Altes, MD  pantoprazole (PROTONIX) 40 MG tablet Take 1 tablet (40 mg total) by mouth 2 (two) times daily. 04/10/21   Bonnielee Haff, MD  polyethylene glycol (MIRALAX / GLYCOLAX) 17 g packet Take 17 g by mouth daily. 05/01/21   Sherwood Gambler, MD  pravastatin (PRAVACHOL) 40 MG tablet Take 40 mg by mouth at bedtime. (2100)    [provider]  tamsulosin (FLOMAX) 0.4 MG CAPS capsule Take 1 capsule (0.4 mg total) by mouth daily. 04/11/21   Bonnielee Haff, MD      Allergies    Bee venom and Influenza vaccines    Review of Systems   Review of Systems  Constitutional:  Positive for fatigue. Negative for chills, diaphoresis and fever.  HENT:  Negative for congestion.   Respiratory:  Positive for cough, chest tightness and shortness of breath. Negative for wheezing.    Cardiovascular:  Positive for chest pain and leg swelling. Negative for palpitations.  Gastrointestinal:  Positive for nausea. Negative for abdominal pain, constipation, diarrhea and vomiting.  Genitourinary:  Negative for flank pain.  Musculoskeletal:  Positive for back pain. Negative for neck pain and neck stiffness.  Skin:  Negative for rash and wound.  Neurological:  Negative for dizziness, weakness, light-headedness, numbness and headaches.  Psychiatric/Behavioral:  Negative for agitation and confusion.   All other systems reviewed and are negative.   Physical Exam Updated Vital Signs BP (!) 139/56 (BP Location: Left Arm)   Pulse 84   Temp 98.8 F (37.1 C) (Oral)   Resp 17   Ht '6\' 2"'$  (1.88 m)   Wt 77.1 kg   SpO2 94%   BMI 21.83 kg/m  Physical Exam Vitals and nursing note reviewed.  Constitutional:      General: He is not in acute distress.  Appearance: He is well-developed. He is not ill-appearing, toxic-appearing or diaphoretic.  HENT:     Head: Normocephalic and atraumatic.  Eyes:     Conjunctiva/sclera: Conjunctivae normal.  Cardiovascular:     Rate and Rhythm: Normal rate and regular rhythm.     Heart sounds: No murmur heard. Pulmonary:     Effort: Pulmonary effort is normal. No respiratory distress.     Breath sounds: Rhonchi and rales present. No wheezing.  Chest:     Chest wall: Tenderness present.    Abdominal:     General: Bowel sounds are normal. There is distension.     Palpations: Abdomen is soft.     Tenderness: There is no abdominal tenderness. There is no right CVA tenderness or left CVA tenderness.     Hernia: A hernia is present.  Musculoskeletal:        General: No swelling.     Cervical back: Neck supple.       Back:  Skin:    General: Skin is warm and dry.     Capillary Refill: Capillary refill takes less than 2 seconds.     Coloration: Skin is not pale.     Findings: No rash.  Neurological:     Mental Status: He is alert.   Psychiatric:        Mood and Affect: Mood normal.     ED Results / Procedures / Treatments   Labs (all labs ordered are listed, but only abnormal results are displayed) Labs Reviewed  CBC WITH DIFFERENTIAL/PLATELET - Abnormal; Notable for the following components:      Result Value   WBC 10.8 (*)    RBC 3.39 (*)    Hemoglobin 10.2 (*)    HCT 31.2 (*)    RDW 17.2 (*)    Neutro Abs 8.6 (*)    All other components within normal limits  COMPREHENSIVE METABOLIC PANEL - Abnormal; Notable for the following components:   Sodium 131 (*)    Potassium 5.6 (*)    Chloride 93 (*)    Glucose, Bld 102 (*)    BUN 72 (*)    Creatinine, Ser 8.35 (*)    Calcium 8.7 (*)    Albumin 3.0 (*)    Alkaline Phosphatase 185 (*)    Total Bilirubin 1.3 (*)    GFR, Estimated 6 (*)    All other components within normal limits  SARS CORONAVIRUS 2 BY RT PCR  CULTURE, BLOOD (ROUTINE X 2)  CULTURE, BLOOD (ROUTINE X 2)  LIPASE, BLOOD  HEPATITIS B SURFACE ANTIGEN  HEPATITIS B SURFACE ANTIBODY,QUALITATIVE  HEPATITIS B SURFACE ANTIBODY, QUANTITATIVE  HEPATITIS B CORE ANTIBODY, TOTAL  HEPATITIS C ANTIBODY  LACTIC ACID, PLASMA  LACTIC ACID, PLASMA  TROPONIN I (HIGH SENSITIVITY)  TROPONIN I (HIGH SENSITIVITY)    EKG EKG Interpretation  Date/Time:  Tuesday October 21 2021 15:00:13 EDT Ventricular Rate:  83 PR Interval:  209 QRS Duration: 104 QT Interval:  389 QTC Calculation: 458 R Axis:   104 Text Interpretation: Sinus rhythm Anterior infarct, old when compared to prior, now appears sinus instead of atrial flutter. NO STEMI Confirmed by Antony Blackbird [phone]) on 10/21/2021 3:06:20 PM  Radiology CT Angio Chest PE W and/or Wo Contrast  Result Date: 10/21/2021 CLINICAL DATA:  87 year old with history of worsening RIGHT-sided pleuritic chest pain presents for evaluation for pulmonary embolism. EXAM: CT ANGIOGRAPHY CHEST WITH CONTRAST TECHNIQUE: Multidetector CT imaging of the chest was performed using  the standard protocol during bolus  administration of intravenous contrast. Multiplanar CT image reconstructions and MIPs were obtained to evaluate the vascular anatomy. RADIATION DOSE REDUCTION: This exam was performed according to the departmental dose-optimization program which includes automated exposure control, adjustment of the mA and/or kV according to patient size and/or use of iterative reconstruction technique. CONTRAST:  72m OMNIPAQUE IOHEXOL 350 MG/ML SOLN COMPARISON:  August 15, 2021. FINDINGS: Cardiovascular: Calcified aortic atherosclerosis also with noncalcified atherosclerotic plaque. Normal caliber of the thoracic aorta without acute process. Heart size is top normal. No pericardial effusion or nodularity. Three-vessel coronary artery disease. Extensive coronary artery calcifications. Central pulmonary vasculature opacified to 236 Hounsfield units, moderate study quality. The study is negative for pulmonary embolism to the segmental level. Subsegmental branches are not well evaluated due to bolus timing Mediastinum/Nodes: No acute process or signs of adenopathy in the mediastinum. Lungs/Pleura: Signs of pulmonary emphysema. No pneumothorax. Pulmonary emphysema is moderate to marked and worse at the lung apices. Small LEFT effusion and LEFT basilar airspace disease. Material in LEFT lower lobe bronchi and mild bronchial wall thickening. Small Bochdalek's hernia containing fat on the RIGHT. Basilar atelectasis mixed with airspace disease and material in RIGHT lower lobe bronchi as well. Bronchial wall thickening to the RIGHT lung base. Upper Abdomen: Incidental imaging of upper abdominal contents without acute process. Musculoskeletal: Signs of spinal fusion. Osteopenia. Spinal fusion in the upper lumbar spine is incompletely assessed. Subacute rib fractures in the bilateral chest without change compared to previous imaging. Visualized clavicles and scapulae as well as the sternum are unremarkable.  Signs of recent compression fractures in the thoracic spine which have occurred since August 15, 2021 showing T5 with 50-60% loss of height along the anterior endplate. No signs of central canal impingement or posterior retropulsion of fracture elements. Sclerosis at this level suggests this may be subacute. Inferior endplate of T7 is fractured leading to approximately 40% loss of height at this level. Kyphotic angulation at the thoracolumbar transition is similar to previous imaging. This area and the fusion are incompletely assessed. Review of the MIP images confirms the above findings. IMPRESSION: 1. No evidence of pulmonary embolism to the segmental level. 2. New T5 and T7 compression fractures. T5 may be subacute but both appear recent and are new since previous imaging. Correlate with pain in these areas. 3. Bilateral subacute rib fractures with similar appearance to recent imaging. 4. Bibasilar airspace disease and material within lower lobe bronchi suspicious for developing aspiration related pneumonia. 5. Pulmonary emphysema and aortic atherosclerosis. 6. Three-vessel coronary artery disease. Aortic Atherosclerosis (ICD10-I70.0) and Emphysema (ICD10-J43.9). Electronically Signed   By: GZetta BillsM.D.   On: 10/21/2021 18:44   VAS [place]OWER EXTREMITY VENOUS (DVT) (ONLY [facility] & [facility])  Result Date: 10/21/2021  Lower Venous DVT Study Patient Name:  WRONOLD HARDGROVE Date of Exam:   10/21/2021 Medical Rec #: [id number]        Accession #:    [id number] of Birth: [birth year]       Patient Gender: M Patient Age:   89years [facility] [place] EXTREMITY VENOUS (DVT) Referring Phys: CMarda Stalker--------------------------------------------------------------------------------  Indications: Pain.  Comparison Study: 03/08/21 prior Performing Technologist: MArchie PattenRVS  Examination Guidelines: A complete evaluation includes B-mode imaging, spectral Doppler,  color Doppler, and power Doppler as needed of all accessible portions of each vessel. Bilateral testing is considered an integral part of a complete examination. Limited examinations  for reoccurring indications may be performed as noted. The reflux portion of the exam is performed with the patient in reverse Trendelenburg.  +-----+---------------+---------+-----------+----------+--------------+ RIGHTCompressibilityPhasicitySpontaneityPropertiesThrombus Aging +-----+---------------+---------+-----------+----------+--------------+ CFV  Full           Yes      Yes                                 +-----+---------------+---------+-----------+----------+--------------+   +---------+---------------+---------+-----------+----------+--------------+ LEFT     CompressibilityPhasicitySpontaneityPropertiesThrombus Aging +---------+---------------+---------+-----------+----------+--------------+ CFV      Full           Yes      Yes                                 +---------+---------------+---------+-----------+----------+--------------+ SFJ      Full                                                        +---------+---------------+---------+-----------+----------+--------------+ FV Prox  Full                                                        +---------+---------------+---------+-----------+----------+--------------+ FV Mid   Full                                                        +---------+---------------+---------+-----------+----------+--------------+ FV DistalFull                                                        +---------+---------------+---------+-----------+----------+--------------+ PFV      Full                                                        +---------+---------------+---------+-----------+----------+--------------+ POP      Full           Yes      Yes                                  +---------+---------------+---------+-----------+----------+--------------+ PTV      Full                                                        +---------+---------------+---------+-----------+----------+--------------+ PERO     Full                                                        +---------+---------------+---------+-----------+----------+--------------+  Summary: RIGHT: - No evidence of common femoral vein obstruction.  LEFT: - There is no evidence of deep vein thrombosis in the lower extremity.  - No cystic structure found in the popliteal fossa.  *See table(s) above for measurements and observations. Electronically signed by Deitra Mayo MD on 10/21/2021 at 4:55:40 PM.    Final    DG Chest Portable 1 View  Result Date: 10/21/2021 CLINICAL DATA:  Chest pain short of breath EXAM: PORTABLE CHEST 1 VIEW COMPARISON:  Chest 09/28/2021 FINDINGS: COPD with hyperinflation. Progression of bibasilar airspace disease. Possible pneumonia versus atelectasis Negative for heart failure.  Small left effusion IMPRESSION: COPD with bibasilar airspace disease, possible pneumonia. Small left pleural effusion. Electronically Signed   By: Franchot Gallo M.D.   On: 10/21/2021 16:21    Procedures Procedures    CRITICAL CARE Performed by: Gwenyth Allegra Kynzleigh Bandel Total critical care time: 35 minutes Critical care time was exclusive of separately billable procedures and treating other patients. Critical care was necessary to treat or prevent imminent or life-threatening deterioration. Critical care was time spent personally by me on the following activities: development of treatment plan with patient and/or surrogate as well as nursing, discussions with consultants, evaluation of patient's response to treatment, examination of patient, obtaining history from patient or surrogate, ordering and performing treatments and interventions, ordering and review of laboratory studies, ordering and review  of radiographic studies, pulse oximetry and re-evaluation of patient's condition.   Medications Ordered in ED Medications  Chlorhexidine Gluconate Cloth 2 % PADS 6 each (has no administration in time range)  cefTRIAXone (ROCEPHIN) 1 g in sodium chloride 0.9 % 100 mL IVPB (has no administration in time range)  azithromycin (ZITHROMAX) 500 mg in sodium chloride 0.9 % 250 mL IVPB (has no administration in time range)  HYDROmorphone (DILAUDID) injection 1 mg (has no administration in time range)  HYDROmorphone (DILAUDID) injection 0.5 mg (0.5 mg Intravenous Given 10/21/21 1558)  ondansetron (ZOFRAN) injection 4 mg (4 mg Intravenous Given 10/21/21 1558)  fentaNYL (SUBLIMAZE) injection 50 mcg (50 mcg Intravenous Given 10/21/21 1755)  HYDROmorphone (DILAUDID) injection 1 mg (1 mg Intravenous Given 10/21/21 1806)  iohexol (OMNIPAQUE) 350 MG/ML injection 70 mL (70 mLs Intravenous Contrast Given 10/21/21 1830)    ED Course/ Medical Decision Making/ A&P                           Medical Decision Making Amount and/or Complexity of Data Reviewed Labs: ordered. Radiology: ordered.  Risk Prescription drug management. Decision regarding hospitalization.    Wayne Meadows is a 87 y.o. male with past medical history significant for previous stroke, ESRD on dialysis MWF, previous renal cell carcinoma, previous pancreatitis, paroxysmal atrial fibrillation not on anticoagulation, hypertension, hyperlipidemia, previous pneumonia, chronic abdominal hernia, and recent pelvis fracture from a fall who presents with new and worsening pleuritic right chest pain, shortness of breath, and fatigue.  Patient reports that several weeks ago he had a fall and broke his pelvis and it was not felt to need surgical management.  He was discharged and has been less mobile.  He said that his left leg has been more swollen over the last week or so and he is developed significant pain in his right chest, right lateral chest, and  right back.  He reports has had some of the back discomfort before but this feels different.  He reports his very painful to take a deep breath and is feeling more  short of breath.  He reports that he is needing 4 L of oxygen at all times now.  He is having some minimal coughing but no productive sputum.  Denies fevers or chills.  Denies any vomiting but had some nausea earlier.  Denies any constipation or diarrhea.  Reports he still makes some urine.  He reports his abdomen is always distended and this is not any different to him.  He is not having any abdominal complaints for me with specifically no pain.  On my exam, lungs did have some rales and coarseness.  His right back and chest and side were tender to palpation.  No rash to suggest shingles.  He did not have a murmur on my exam.  Abdomen is have a stented area with his hernia but it was nontender for me.  Bowel sounds were appreciated.  He did have some edema in both legs but left was worse than right.  Intact sensation and strength otherwise.  Symmetric smile.  Clear speech.  EKG does not show STEMI.  Clinically I am somewhat concerned that patient could have either previously missed rib fracture from this fall, musculoskeletal pain, pneumonia recurrence, or even something a pulmonary embolism given his recent pelvis fracture with mobility, history of cancer, and this pleuritic chest pain and shortness of breath with unilateral leg symptoms.  We will get ultrasound and labs we will get x-ray.  Will likely discuss with nephrology about if he is able to get a CT PE study since he still reports he makes urine.  Anticipate he may need this.  Patient does that he missed dialysis yesterday due to his severe pain shortness of breath and chest discomfort.  Anticipate reassessment after work-up to determine disposition.  Labs began to return.  Patient does have elevated creatinine as expected given his missed dialysis.  7:33 PM CT scan returned showing  no pulm embolism does show evidence of pneumonia.  Also has old rib fractures with patient knows about but shows some possibly subacute T6 and T7 compression fractures.  Patient reports she had a fall several weeks ago so I question if he did have some new component of injury that is acute on chronic leading to difficulty with breathing leading to development of recurrent pneumonia.  Patient will go to dialysis with nephrology but we will call for admission for the increased oxygen requirement, new pneumonia, and chest pain and shortness of breath.  Ultrasound not evidence of DVT.          Final Clinical Impression(s) / ED Diagnoses Final diagnoses:  Pneumonia due to infectious organism, unspecified laterality, unspecified part of lung  Pleuritic chest pain  Back pain, unspecified back location, unspecified back pain laterality, unspecified chronicity     Clinical Impression: 1. Pneumonia due to infectious organism, unspecified laterality, unspecified part of lung   2. Pleuritic chest pain   3. Back pain, unspecified back location, unspecified back pain laterality, unspecified chronicity     Disposition: Admit  This note was prepared with assistance of Dragon voice recognition software. Occasional wrong-word or sound-a-like substitutions may have occurred due to the inherent limitations of voice recognition software.        Atalya Dano, Gwenyth Allegra, MD 10/21/21 [phone]

## 2021-10-21 NOTE — ED Notes (Signed)
Spoke w/ lab staff and they reported they will add the hepatitis B and C labs to the gold tops that were sent down previously.

## 2021-10-21 NOTE — Progress Notes (Signed)
Lower extremity venous has been completed.   Preliminary results in CV Proc.   Jinny Blossom Alba Perillo 10/21/2021 4:15 PM

## 2021-10-21 NOTE — Consult Note (Signed)
[name] Renal Consultation Note    Indication for Consultation:  Management of ESRD/hemodialysis; anemia, hypertension/volume and secondary hyperparathyroidism  HPI: Wayne Meadows is a 87 y.o. male with PMH significant for ESRD on hemodialysis MWF at [facility], HTN, CAD, CVA, PAF not on AC, COPD, Anemia, SHPT who presented to [facility] ED via EMS from [facility] due to fall and worsening abdominal and right flank pain.  He missed HD yesterday because he was in too much pain after he fell over his wheelchair 2 weeks ago.  He was also having more SOB.  In the ED, VSS, SpO2 97% on 4 liters (usually 2 liters [place]), labs notable for Na 131, K 5.6, BUN 72, Cr 8.35, alb 3, WBC 10.8, Hgb 10.2, negative SARS.  CXR c/w COPD with bibasilar airspace disease, possible pneumonia, and small left pleural effusion.  He is being admitted for further evaluation and we were consulted to provide HD during his hospitalization.  His main complaint is severe abdominal and right back/side pain.  He was discharged from [facility] on 09/30/21 due to SOB and found to have A flutter vs SVT with RVR.  He converted to NSR with cardizem as well as pneumonia.  He was discharged back to his SNF.   Past Medical History:  Diagnosis Date   Abnormal echocardiogram 03/08/2021   Abnormal finding on GI tract imaging    Acute blood loss anemia    Acute pancreatitis 08/12/2020   Arthritis    Carotid stenosis    Community acquired pneumonia 04/06/2021   COPD (chronic obstructive pulmonary disease) (HCC)    Coronary artery disease    S/p PCI 2011;  NSTEMI 12/12:  LHC/PCI 02/23/11: LAD 60% after the septal perforator, D1 occluded with distal collaterals, proximal RI 30-40%, AV circumflex stent patent with 60% stenosis after the stent, RCA 99%, EF 60-65%.  His RCA was treated with a bare-metal stent   CVA (cerebral infarction) 2011   Right cerebral; total obstruction of the right ICA   Diverticulitis    Hypertension     Paroxysmal atrial fibrillation with RVR ([place]) 04/09/2021   Pleuritic chest pain 04/08/2014   Pressure injury of skin 03/30/2021   Rash 03/05/2021   Rectal bleeding 07/2015   Refractory nausea and vomiting 06/20/2020   Renal carcinoma ([place])    Sepsis, unspecified organism ([place]) 04/11/2016   Stroke ([place])    Tobacco abuse, in remission    Past Surgical History:  Procedure Laterality Date   AV FISTULA PLACEMENT Right 04/03/2021   Procedure: ARTERIOVENOUS (AV) CREATION OF RIGHT ARM BRACHIOCEPHALIC FISTULA;  Surgeon: Cherre Robins, MD;  Location: [place];  Service: Vascular;  Laterality: Right;   BIOPSY  04/08/2021   Procedure: BIOPSY;  Surgeon: Lavena Bullion, DO;  Location: [place];  Service: Gastroenterology;;   COLON SURGERY     COLONOSCOPY N/A 08/22/2015   Procedure: COLONOSCOPY;  Surgeon: [name], MD;  Location: [facility];  Service: Endoscopy;  Laterality: N/A;   ESOPHAGOGASTRODUODENOSCOPY N/A 07/18/2020   Procedure: ESOPHAGOGASTRODUODENOSCOPY (EGD);  Surgeon: Milus Banister, MD;  Location: [facility];  Service: Endoscopy;  Laterality: N/A;   ESOPHAGOGASTRODUODENOSCOPY (EGD) WITH PROPOFOL N/A 06/21/2020   Procedure: ESOPHAGOGASTRODUODENOSCOPY (EGD) WITH PROPOFOL;  Surgeon: Irene Shipper, MD;  Location: [facility] ENDOSCOPY;  Service: Endoscopy;  Laterality: N/A;   ESOPHAGOGASTRODUODENOSCOPY (EGD) WITH PROPOFOL N/A 04/08/2021   Procedure: ESOPHAGOGASTRODUODENOSCOPY (EGD) WITH PROPOFOL;  Surgeon: Lavena Bullion, DO;  Location: [place];  Service: Gastroenterology;  Laterality: N/A;   EUS N/A 07/18/2020   Procedure: UPPER ENDOSCOPIC ULTRASOUND (EUS) RADIAL;  Surgeon: Milus Banister, MD;  Location: [facility] ENDOSCOPY;  Service: Endoscopy;  Laterality: N/A;   FINE NEEDLE ASPIRATION N/A 07/18/2020   Procedure: FINE NEEDLE ASPIRATION (FNA) LINEAR;  Surgeon: Milus Banister, MD;  Location: [facility] ENDOSCOPY;  Service: Endoscopy;  Laterality: N/A;   HEMOSTASIS CLIP PLACEMENT  04/08/2021    Procedure: HEMOSTASIS CLIP PLACEMENT;  Surgeon: Lavena Bullion, DO;  Location: [place];  Service: Gastroenterology;;   hip relacement     INCISION AND DRAINAGE Right 09/11/2021   Procedure: INCISION AND DRAINAGE OF RIGHT ARM FISTULA;  Surgeon: Cherre Robins, MD;  Location: [facility] OR;  Service: Vascular;  Laterality: Right;   IR FLUORO GUIDE CV LINE RIGHT  03/31/2021   IR REMOVAL TUN CV CATH W/O FL  08/15/2021   IR US GUIDE VASC ACCESS RIGHT  03/31/2021   KIDNEY SURGERY     LEFT HEART CATH AND CORONARY ANGIOGRAPHY N/A 07/09/2020   Procedure: LEFT HEART CATH AND CORONARY ANGIOGRAPHY;  Surgeon: Leonie Man, MD;  Location: [place] CV LAB;  Service: Cardiovascular;  Laterality: N/A;   LEFT HEART CATHETERIZATION WITH CORONARY ANGIOGRAM N/A 02/23/2011   Procedure: LEFT HEART CATHETERIZATION WITH CORONARY ANGIOGRAM;  Surgeon: [name], MD;  Location: [facility] CATH LAB;  Service: Cardiovascular;  Laterality: N/A;   LEFT HEART CATHETERIZATION WITH CORONARY ANGIOGRAM N/A 03/18/2011   Procedure: LEFT HEART CATHETERIZATION WITH CORONARY ANGIOGRAM;  Surgeon: [name], MD;  Location: [facility] CATH LAB;  Service: Cardiovascular;  Laterality: N/A;   PERCUTANEOUS CORONARY STENT INTERVENTION (PCI-S) N/A 02/23/2011   Procedure: PERCUTANEOUS CORONARY STENT INTERVENTION (PCI-S);  Surgeon: [name], MD;  Location: [facility] CATH LAB;  Service: Cardiovascular;  Laterality: N/A;   TEMPORARY PACEMAKER INSERTION N/A 02/23/2011   Procedure: TEMPORARY PACEMAKER INSERTION;  Surgeon: [name], MD;  Location: [facility] CATH LAB;  Service: Cardiovascular;  Laterality: N/A;   THROMBECTOMY W/ EMBOLECTOMY Right 04/03/2021   Procedure: THROMBECTOMY ARTERIOVENOUS FISTULA;  Surgeon: Cherre Robins, MD;  Location: [facility] OR;  Service: Vascular;  Laterality: Right;   Family History:   Family History  Problem Relation Age of Onset   Heart attack Other 59   Social History:  reports that he quit smoking about 14 years ago. His  smoking use included cigarettes. He has never used smokeless tobacco. He reports that he does not currently use alcohol after a past usage of about 1.0 standard drink of alcohol per week. He reports that he does not use drugs. Allergies  Allergen Reactions   Bee Venom Anaphylaxis    Has epi pen   Influenza Vaccines Other (See Comments)    "Mortally sick for 2 weeks"   Prior to Admission medications   Medication Sig Start Date End Date Taking? Authorizing Provider  Amino Acids-Protein Hydrolys (PRO-STAT SUGAR FREE PO) Take 30 mLs by mouth every evening.   Yes [provider]  busPIRone (BUSPAR) 5 MG tablet Take 5 mg by mouth 2 (two) times daily. (0800 & 2000)   Yes [provider]  Ipratropium-Albuterol (COMBIVENT) 20-100 MCG/ACT AERS respimat Inhale 1 puff into the lungs 4 (four) times daily. (0900, 1300, 1700 & 2100)   Yes [provider]  ipratropium-albuterol (DUONEB) 0.5-2.5 (3) MG/3ML SOLN Take 3 mLs by nebulization every 4 (four) hours as needed (shortness of breath/wheezing).   Yes [provider]  levothyroxine (SYNTHROID) 50 MCG tablet Take 50 mcg by  mouth daily before breakfast. (0630)   Yes [provider]  LIDOCAINE PAIN RELIEF 4 % Place 1 patch onto the skin daily. Apply 1 patch to back in the morning and remove at bedtime for pain. 10/17/21  Yes [provider]  melatonin 5 MG TABS Take 5 mg by mouth at bedtime. 09/30/21  Yes [provider]  metoprolol tartrate (LOPRESSOR) 25 MG tablet Take 0.5 tablets (12.5 mg total) by mouth 2 (two) times daily. 09/30/21  Yes Cherene Altes, MD  naphazoline-glycerin (CLEAR EYES REDNESS) 0.012-0.25 % SOLN Place 1-2 drops into the right eye 3 (three) times daily. 04/23/21  Yes Alma Friendly, MD  neomycin-bacitracin-polymyxin (NEOSPORIN) OINT Apply 1 application topically 3 (three) times daily. To right antecubital area 04/10/21  Yes Bonnielee Haff, MD  Nutritional Supplements  (NOVASOURCE RENAL) LIQD Take 237 mLs by mouth in the morning and at bedtime. (0800 & 1700)   Yes [provider]  oxyCODONE (OXY IR/ROXICODONE) 5 MG immediate release tablet Take 1 tablet (5 mg total) by mouth every 6 (six) hours as needed for severe pain. Patient taking differently: Take 5 mg by mouth every 4 (four) hours as needed for severe pain. 09/30/21  Yes Cherene Altes, MD  pravastatin (PRAVACHOL) 40 MG tablet Take 40 mg by mouth at bedtime. (2100)   Yes [provider]  senna (SENOKOT) 8.6 MG tablet Take 2 tablets by mouth at bedtime. 10/15/21  Yes [provider]  tamsulosin (FLOMAX) 0.4 MG CAPS capsule Take 1 capsule (0.4 mg total) by mouth daily. 04/11/21  Yes Bonnielee Haff, MD  acetaminophen (TYLENOL) 325 MG tablet Take 2 tablets (650 mg total) by mouth every 6 (six) hours as needed for mild pain (or Fever >/= 101). Patient not taking: Reported on 10/21/2021 04/10/21   Bonnielee Haff, MD  camphor-menthol [facility]) lotion Apply topically as needed for itching. Patient not taking: Reported on 10/21/2021 04/23/21   Alma Friendly, MD  cefdinir (OMNICEF) 300 MG capsule Take 1 capsule (300 mg total) by mouth daily. Patient not taking: Reported on 10/21/2021 10/01/21   Cherene Altes, MD  Darbepoetin Alfa (ARANESP) 60 MCG/0.3ML SOSY injection Inject 0.3 mLs (60 mcg total) into the vein every Monday with hemodialysis. 09/30/21   Cherene Altes, MD  diltiazem (CARDIZEM CD) 240 MG 24 hr capsule Take 1 capsule (240 mg total) by mouth daily. Patient not taking: Reported on 10/21/2021 10/01/21   Cherene Altes, MD  hydrOXYzine (ATARAX) 10 MG tablet Take 10 mg by mouth every 6 (six) hours as needed for itching. Patient not taking: Reported on 10/21/2021    [provider]  lanthanum (FOSRENOL) 1000 MG chewable tablet Chew 1 tablet (1,000 mg total) by mouth 3 (three) times daily with meals. Patient not taking: Reported on 10/21/2021 04/04/21   Little Ishikawa, MD  Menthol, Topical Analgesic, (BIOFREEZE) 4 % GEL Apply 1 Application topically 3 (three) times daily as needed (pain). Patient not taking: Reported on 10/21/2021    [provider]  Multiple Vitamin (MULTIVITAMIN WITH MINERALS) TABS tablet Take 1 tablet by mouth daily. (0900) Patient not taking: Reported on 10/21/2021    [provider]  Multiple Vitamins-Minerals (DECUBI-VITE PO) Take 1 tablet by mouth in the morning. (0900) Patient not taking: Reported on 10/21/2021    [provider]  ondansetron (ZOFRAN) 4 MG tablet Take 1 tablet (4 mg total) by mouth daily as needed for nausea or vomiting. Patient not taking: Reported on 10/21/2021  04/11/21 04/11/22  Bonnielee Haff, MD  pantoprazole (PROTONIX) 40 MG tablet Take 1 tablet (40 mg total) by mouth 2 (two) times daily. Patient not taking: Reported on 10/21/2021 04/10/21   Bonnielee Haff, MD  polyethylene glycol (MIRALAX / GLYCOLAX) 17 g packet Take 17 g by mouth daily. Patient not taking: Reported on 10/21/2021 05/01/21   Sherwood Gambler, MD   No current facility-administered medications for this encounter.   Current Outpatient Medications  Medication Sig Dispense Refill   Amino Acids-Protein Hydrolys (PRO-STAT SUGAR FREE PO) Take 30 mLs by mouth every evening.     busPIRone (BUSPAR) 5 MG tablet Take 5 mg by mouth 2 (two) times daily. (0800 & 2000)     Ipratropium-Albuterol (COMBIVENT) 20-100 MCG/ACT AERS respimat Inhale 1 puff into the lungs 4 (four) times daily. (0900, 1300, 1700 & 2100)     ipratropium-albuterol (DUONEB) 0.5-2.5 (3) MG/3ML SOLN Take 3 mLs by nebulization every 4 (four) hours as needed (shortness of breath/wheezing).     levothyroxine (SYNTHROID) 50 MCG tablet Take 50 mcg by mouth daily before breakfast. (0630)     LIDOCAINE PAIN RELIEF 4 % Place 1 patch onto the skin daily. Apply 1 patch to back in the morning and remove at bedtime for pain.     melatonin 5 MG TABS Take 5 mg by mouth at  bedtime.     metoprolol tartrate (LOPRESSOR) 25 MG tablet Take 0.5 tablets (12.5 mg total) by mouth 2 (two) times daily.     naphazoline-glycerin (CLEAR EYES REDNESS) 0.012-0.25 % SOLN Place 1-2 drops into the right eye 3 (three) times daily.     neomycin-bacitracin-polymyxin (NEOSPORIN) OINT Apply 1 application topically 3 (three) times daily. To right antecubital area     Nutritional Supplements (NOVASOURCE RENAL) LIQD Take 237 mLs by mouth in the morning and at bedtime. (0800 & 1700)     oxyCODONE (OXY IR/ROXICODONE) 5 MG immediate release tablet Take 1 tablet (5 mg total) by mouth every 6 (six) hours as needed for severe pain. (Patient taking differently: Take 5 mg by mouth every 4 (four) hours as needed for severe pain.) 12 tablet 0   pravastatin (PRAVACHOL) 40 MG tablet Take 40 mg by mouth at bedtime. (2100)     senna (SENOKOT) 8.6 MG tablet Take 2 tablets by mouth at bedtime.     tamsulosin (FLOMAX) 0.4 MG CAPS capsule Take 1 capsule (0.4 mg total) by mouth daily. 30 capsule    acetaminophen (TYLENOL) 325 MG tablet Take 2 tablets (650 mg total) by mouth every 6 (six) hours as needed for mild pain (or Fever >/= 101). (Patient not taking: Reported on 10/21/2021)     camphor-menthol (SARNA) lotion Apply topically as needed for itching. (Patient not taking: Reported on 10/21/2021) 222 mL 0   cefdinir (OMNICEF) 300 MG capsule Take 1 capsule (300 mg total) by mouth daily. (Patient not taking: Reported on 10/21/2021)     Darbepoetin Alfa (ARANESP) 60 MCG/0.3ML SOSY injection Inject 0.3 mLs (60 mcg total) into the vein every Monday with hemodialysis. 4.2 mL    diltiazem (CARDIZEM CD) 240 MG 24 hr capsule Take 1 capsule (240 mg total) by mouth daily. (Patient not taking: Reported on 10/21/2021)     hydrOXYzine (ATARAX) 10 MG tablet Take 10 mg by mouth every 6 (six) hours as needed for itching. (Patient not taking: Reported on 10/21/2021)     lanthanum (FOSRENOL) 1000 MG chewable tablet Chew 1 tablet (1,000  mg total) by mouth 3 (three) times  daily with meals. (Patient not taking: Reported on 10/21/2021) 90 tablet 0   Menthol, Topical Analgesic, (BIOFREEZE) 4 % GEL Apply 1 Application topically 3 (three) times daily as needed (pain). (Patient not taking: Reported on 10/21/2021)     Multiple Vitamin (MULTIVITAMIN WITH MINERALS) TABS tablet Take 1 tablet by mouth daily. (0900) (Patient not taking: Reported on 10/21/2021)     Multiple Vitamins-Minerals (DECUBI-VITE PO) Take 1 tablet by mouth in the morning. (0900) (Patient not taking: Reported on 10/21/2021)     ondansetron (ZOFRAN) 4 MG tablet Take 1 tablet (4 mg total) by mouth daily as needed for nausea or vomiting. (Patient not taking: Reported on 10/21/2021) 30 tablet 1   pantoprazole (PROTONIX) 40 MG tablet Take 1 tablet (40 mg total) by mouth 2 (two) times daily. (Patient not taking: Reported on 10/21/2021)     polyethylene glycol (MIRALAX / GLYCOLAX) 17 g packet Take 17 g by mouth daily. (Patient not taking: Reported on 10/21/2021) 14 each 0   Labs: Basic Metabolic Panel: Recent Labs  Lab 10/21/21 1554  NA 131*  K 5.6*  CL 93*  CO2 23  GLUCOSE 102*  BUN 72*  CREATININE 8.35*  CALCIUM 8.7*   Liver Function Tests: Recent Labs  Lab 10/21/21 1554  AST 16  ALT 13  ALKPHOS 185*  BILITOT 1.3*  PROT 6.5  ALBUMIN 3.0*   Recent Labs  Lab 10/21/21 1554  LIPASE 23   No results for input(s): "AMMONIA" in the last 168 hours. CBC: Recent Labs  Lab 10/21/21 1554  WBC 10.8*  NEUTROABS 8.6*  HGB 10.2*  HCT 31.2*  MCV 92.0  PLT 316   Cardiac Enzymes: No results for input(s): "CKTOTAL", "CKMB", "CKMBINDEX", "TROPONINI" in the last 168 hours. CBG: No results for input(s): "GLUCAP" in the last 168 hours. Iron Studies: No results for input(s): "IRON", "TIBC", "TRANSFERRIN", "FERRITIN" in the last 72 hours. Studies/Results: VAS [place] LOWER EXTREMITY VENOUS (DVT) (ONLY [facility])  Result Date: 10/21/2021  Lower Venous DVT Study Patient Name:   Wayne Meadows  Date of Exam:   10/21/2021 Medical Rec #: [id number]         Accession #:    [id number] Date of Birth: [birth year]        Patient Gender: M Patient Age:   64 years Exam Location:  [facility] Procedure:      VAS [place] LOWER EXTREMITY VENOUS (DVT) Referring Phys: Marda Stalker --------------------------------------------------------------------------------  Indications: Pain.  Comparison Study: 03/08/21 prior Performing Technologist: Archie Patten RVS  Examination Guidelines: A complete evaluation includes B-mode imaging, spectral Doppler, color Doppler, and power Doppler as needed of all accessible portions of each vessel. Bilateral testing is considered an integral part of a complete examination. Limited examinations for reoccurring indications may be performed as noted. The reflux portion of the exam is performed with the patient in reverse Trendelenburg.  +-----+---------------+---------+-----------+----------+--------------+ RIGHTCompressibilityPhasicitySpontaneityPropertiesThrombus Aging +-----+---------------+---------+-----------+----------+--------------+ CFV  Full           Yes      Yes                                 +-----+---------------+---------+-----------+----------+--------------+   +---------+---------------+---------+-----------+----------+--------------+ LEFT     CompressibilityPhasicitySpontaneityPropertiesThrombus Aging +---------+---------------+---------+-----------+----------+--------------+ CFV      Full           Yes      Yes                                 +---------+---------------+---------+-----------+----------+--------------+  SFJ      Full                                                        +---------+---------------+---------+-----------+----------+--------------+ FV Prox  Full                                                        +---------+---------------+---------+-----------+----------+--------------+ FV  Mid   Full                                                        +---------+---------------+---------+-----------+----------+--------------+ FV DistalFull                                                        +---------+---------------+---------+-----------+----------+--------------+ PFV      Full                                                        +---------+---------------+---------+-----------+----------+--------------+ POP      Full           Yes      Yes                                 +---------+---------------+---------+-----------+----------+--------------+ PTV      Full                                                        +---------+---------------+---------+-----------+----------+--------------+ PERO     Full                                                        +---------+---------------+---------+-----------+----------+--------------+     Summary: RIGHT: - No evidence of common femoral vein obstruction.  LEFT: - There is no evidence of deep vein thrombosis in the lower extremity.  - No cystic structure found in the popliteal fossa.  *See table(s) above for measurements and observations. Electronically signed by Deitra Mayo MD on 10/21/2021 at 4:55:40 PM.    Final    DG Chest Portable 1 View  Result Date: 10/21/2021 CLINICAL DATA:  Chest pain short of breath EXAM: PORTABLE CHEST 1 VIEW COMPARISON:  Chest 09/28/2021 FINDINGS: COPD with hyperinflation. Progression of bibasilar airspace disease. Possible pneumonia versus atelectasis Negative for heart failure.  Small left effusion  IMPRESSION: COPD with bibasilar airspace disease, possible pneumonia. Small left pleural effusion. Electronically Signed   By: Franchot Gallo M.D.   On: 10/21/2021 16:21    ROS: Pertinent items are noted in HPI. Physical Exam: Vitals:   10/21/21 1500 10/21/21 1600 10/21/21 1615 10/21/21 1630  BP: (!) 135/59 (!) 150/70 (!) 145/68 (!) 142/62  Pulse: 84 88 84 84   Resp: 18 17 (!) 21 (!) 22  Temp:      TempSrc:      SpO2: 97% 95% 97% 97%  Weight:      Height:          Weight change:  No intake or output data in the 24 hours ending 10/21/21 1815 BP (!) 142/62   Pulse 84   Temp 98.8 F (37.1 C) (Oral)   Resp (!) 22   Ht '6\' 2"'$  (1.88 m)   Wt 77.1 kg   SpO2 97%   BMI 21.83 kg/m  General appearance: moderate distress and disheveled  Head: Normocephalic, without obvious abnormality, atraumatic Resp: rhonchi bilaterally Cardio: regular rate and rhythm, S1, S2 normal, no murmur, click, rub or gallop GI: soft, non-tender; bowel sounds normal; no masses,  no organomegaly Extremities: edema trace pretibial edema bilaterally and RUE AVF +T/B Dialysis Access:  Dialysis Orders: OP HD: MWF [facility]  4h  300/500  71 kg  3K/2.5Ca bath  Heparin 3000  RUE AVF - mircera 75 mcg IVP q2 weeks - iron sucrose '50mg'$  q wk  Assessment/Plan:  Back and abdominal pain - associated with SOB.  For CT angio to r/o PE but may also have rib fracture and/or pneumonia.  Plan per primary.  Unable to lie flat for CT due to pain and SOB.  Will UF with HD and hopefully can try again tonight or tomorrow.  Lower extremity duplex without evidence of DVT.  Pain management per primary but avoid morphine and baclofen. Avoid nephrotoxic medications including NSAIDs and iodinated intravenous contrast exposure unless the latter is absolutely indicated.   Preferred narcotic agents for pain control are hydromorphone, fentanyl, and methadone. Morphine should not be used.  Avoid Baclofen and avoid oral sodium phosphate and magnesium citrate based laxatives / bowel preps.   ESRD -  missed HD yesterday.  Will plan for HD today and UF as tolerated.  Hypertension/volume  - bp stable but some volume overload.  UF as tolerated  Anemia  - Hgb stable, continue with ESA and follow H/H.  Metabolic bone disease -  continue with home meds  Nutrition - renal diet.  Donetta Potts, MD [place] 10/21/2021, 6:15 PM

## 2021-10-22 ENCOUNTER — Encounter (HOSPITAL_COMMUNITY): Payer: Self-pay | Admitting: Family Medicine

## 2021-10-22 ENCOUNTER — Other Ambulatory Visit: Payer: Self-pay

## 2021-10-22 DIAGNOSIS — J690 Pneumonitis due to inhalation of food and vomit: Secondary | ICD-10-CM | POA: Diagnosis not present

## 2021-10-22 DIAGNOSIS — J449 Chronic obstructive pulmonary disease, unspecified: Secondary | ICD-10-CM | POA: Diagnosis not present

## 2021-10-22 DIAGNOSIS — M549 Dorsalgia, unspecified: Secondary | ICD-10-CM | POA: Diagnosis present

## 2021-10-22 DIAGNOSIS — Z8673 Personal history of transient ischemic attack (TIA), and cerebral infarction without residual deficits: Secondary | ICD-10-CM

## 2021-10-22 DIAGNOSIS — N186 End stage renal disease: Secondary | ICD-10-CM | POA: Diagnosis not present

## 2021-10-22 DIAGNOSIS — J9621 Acute and chronic respiratory failure with hypoxia: Secondary | ICD-10-CM | POA: Diagnosis not present

## 2021-10-22 LAB — CBC
HCT: 29.9 % — ABNORMAL LOW (ref 39.0–52.0)
Hemoglobin: 9.7 g/dL — ABNORMAL LOW (ref 13.0–17.0)
MCH: 29.8 pg (ref 26.0–34.0)
MCHC: 32.4 g/dL (ref 30.0–36.0)
MCV: 92 fL (ref 80.0–100.0)
Platelets: 321 10*3/uL (ref 150–400)
RBC: 3.25 MIL/uL — ABNORMAL LOW (ref 4.22–5.81)
RDW: 17.1 % — ABNORMAL HIGH (ref 11.5–15.5)
WBC: 10.6 10*3/uL — ABNORMAL HIGH (ref 4.0–10.5)
nRBC: 0 % (ref 0.0–0.2)

## 2021-10-22 LAB — BASIC METABOLIC PANEL
Anion gap: 15 (ref 5–15)
BUN: 33 mg/dL — ABNORMAL HIGH (ref 8–23)
CO2: 23 mmol/L (ref 22–32)
Calcium: 9 mg/dL (ref 8.9–10.3)
Chloride: 97 mmol/L — ABNORMAL LOW (ref 98–111)
Creatinine, Ser: 4.61 mg/dL — ABNORMAL HIGH (ref 0.61–1.24)
GFR, Estimated: 12 mL/min — ABNORMAL LOW (ref >60)
Glucose, Bld: 74 mg/dL (ref 70–99)
Potassium: 4 mmol/L (ref 3.5–5.1)
Sodium: 135 mmol/L (ref 135–145)

## 2021-10-22 LAB — HEPATIC FUNCTION PANEL
ALT: 13 U/L (ref 0–44)
AST: 14 U/L — ABNORMAL LOW (ref 15–41)
Albumin: 2.8 g/dL — ABNORMAL LOW (ref 3.5–5.0)
Alkaline Phosphatase: 158 U/L — ABNORMAL HIGH (ref 38–126)
Bilirubin, Direct: 0.2 mg/dL (ref 0.0–0.2)
Indirect Bilirubin: 1.3 mg/dL — ABNORMAL HIGH (ref 0.3–0.9)
Total Bilirubin: 1.5 mg/dL — ABNORMAL HIGH (ref 0.3–1.2)
Total Protein: 5.9 g/dL — ABNORMAL LOW (ref 6.5–8.1)

## 2021-10-22 LAB — MRSA NEXT GEN BY PCR, NASAL: MRSA by PCR Next Gen: DETECTED — AB

## 2021-10-22 LAB — LACTIC ACID, PLASMA: Lactic Acid, Venous: 0.7 mmol/L (ref 0.5–1.9)

## 2021-10-22 LAB — PROCALCITONIN: Procalcitonin: 0.24 ng/mL

## 2021-10-22 MED ORDER — NAPHAZOLINE-GLYCERIN 0.012-0.25 % OP SOLN
1.0000 [drp] | Freq: Three times a day (TID) | OPHTHALMIC | Status: DC
  Administered 2021-10-22: 1 [drp] via OPHTHALMIC
  Administered 2021-10-22: 2 [drp] via OPHTHALMIC
  Administered 2021-10-23 – 2021-10-26 (×4): 1 [drp] via OPHTHALMIC
  Administered 2021-10-27: 2 [drp] via OPHTHALMIC
  Administered 2021-10-28: 1 [drp] via OPHTHALMIC
  Administered 2021-10-28 – 2021-10-29 (×3): 2 [drp] via OPHTHALMIC
  Administered 2021-10-29 – 2021-10-30 (×2): 1 [drp] via OPHTHALMIC
  Filled 2021-10-22: qty 15

## 2021-10-22 MED ORDER — CHLORHEXIDINE GLUCONATE CLOTH 2 % EX PADS
6.0000 | MEDICATED_PAD | Freq: Every day | CUTANEOUS | Status: AC
  Administered 2021-10-22 – 2021-10-26 (×5): 6 via TOPICAL

## 2021-10-22 MED ORDER — CHLORHEXIDINE GLUCONATE CLOTH 2 % EX PADS
6.0000 | MEDICATED_PAD | Freq: Every day | CUTANEOUS | Status: DC
  Administered 2021-10-22 – 2021-10-24 (×3): 6 via TOPICAL

## 2021-10-22 MED ORDER — MUPIROCIN 2 % EX OINT
1.0000 | TOPICAL_OINTMENT | Freq: Two times a day (BID) | CUTANEOUS | Status: AC
  Administered 2021-10-22 – 2021-10-26 (×10): 1 via NASAL
  Filled 2021-10-22: qty 22

## 2021-10-22 MED ORDER — LIDOCAINE 5 % EX PTCH
1.0000 | MEDICATED_PATCH | Freq: Every day | CUTANEOUS | Status: DC
Start: 2021-10-22 — End: 2021-10-30
  Administered 2021-10-22 – 2021-10-30 (×8): 1 via TRANSDERMAL
  Filled 2021-10-22 (×9): qty 1

## 2021-10-22 MED ORDER — SENNA 8.6 MG PO TABS
2.0000 | ORAL_TABLET | Freq: Every day | ORAL | Status: DC
  Administered 2021-10-22 – 2021-10-29 (×7): 17.2 mg via ORAL
  Filled 2021-10-22 (×8): qty 2

## 2021-10-22 MED ORDER — BACITRACIN-NEOMYCIN-POLYMYXIN OINTMENT TUBE
1.0000 | TOPICAL_OINTMENT | Freq: Three times a day (TID) | CUTANEOUS | Status: DC
Start: 2021-10-22 — End: 2021-10-30
  Administered 2021-10-22 – 2021-10-30 (×11): 1 via TOPICAL
  Filled 2021-10-22: qty 14

## 2021-10-22 MED ORDER — BUSPIRONE HCL 5 MG PO TABS
5.0000 mg | ORAL_TABLET | Freq: Two times a day (BID) | ORAL | Status: DC
  Administered 2021-10-22 – 2021-10-30 (×18): 5 mg via ORAL
  Filled 2021-10-22 (×18): qty 1

## 2021-10-22 MED ORDER — LEVOTHYROXINE SODIUM 50 MCG PO TABS
50.0000 ug | ORAL_TABLET | Freq: Every day | ORAL | Status: DC
  Administered 2021-10-22 – 2021-10-30 (×8): 50 ug via ORAL
  Filled 2021-10-22 (×8): qty 1

## 2021-10-22 MED ORDER — HYDROCODONE-ACETAMINOPHEN 5-325 MG PO TABS
1.0000 | ORAL_TABLET | ORAL | Status: DC | PRN
  Administered 2021-10-22 – 2021-10-24 (×7): 2 via ORAL
  Administered 2021-10-24: 1 via ORAL
  Filled 2021-10-22 (×4): qty 2
  Filled 2021-10-22: qty 1
  Filled 2021-10-22 (×3): qty 2

## 2021-10-22 MED ORDER — ORAL CARE MOUTH RINSE
15.0000 mL | OROMUCOSAL | Status: DC
  Administered 2021-10-22 – 2021-10-30 (×25): 15 mL via OROMUCOSAL

## 2021-10-22 MED ORDER — SODIUM CHLORIDE 0.9% FLUSH
3.0000 mL | Freq: Two times a day (BID) | INTRAVENOUS | Status: DC
  Administered 2021-10-22 – 2021-10-30 (×14): 3 mL via INTRAVENOUS

## 2021-10-22 MED ORDER — HEPARIN SODIUM (PORCINE) 5000 UNIT/ML IJ SOLN
5000.0000 [IU] | Freq: Three times a day (TID) | INTRAMUSCULAR | Status: DC
  Administered 2021-10-22 – 2021-10-30 (×24): 5000 [IU] via SUBCUTANEOUS
  Filled 2021-10-22 (×23): qty 1

## 2021-10-22 MED ORDER — PRAVASTATIN SODIUM 40 MG PO TABS
40.0000 mg | ORAL_TABLET | Freq: Every day | ORAL | Status: DC
Start: 2021-10-22 — End: 2021-10-30
  Administered 2021-10-22 – 2021-10-29 (×8): 40 mg via ORAL
  Filled 2021-10-22 (×8): qty 1

## 2021-10-22 MED ORDER — ACETAMINOPHEN 325 MG PO TABS
650.0000 mg | ORAL_TABLET | Freq: Four times a day (QID) | ORAL | Status: DC | PRN
  Administered 2021-10-24 – 2021-10-27 (×5): 650 mg via ORAL
  Filled 2021-10-22 (×6): qty 2

## 2021-10-22 MED ORDER — HYDROMORPHONE HCL 1 MG/ML IJ SOLN
0.5000 mg | INTRAMUSCULAR | Status: DC | PRN
  Administered 2021-10-22 – 2021-10-30 (×26): 1 mg via INTRAVENOUS
  Filled 2021-10-22 (×30): qty 1

## 2021-10-22 MED ORDER — METOPROLOL TARTRATE 12.5 MG HALF TABLET
12.5000 mg | ORAL_TABLET | Freq: Two times a day (BID) | ORAL | Status: DC
  Administered 2021-10-22 – 2021-10-30 (×16): 12.5 mg via ORAL
  Filled 2021-10-22 (×16): qty 1

## 2021-10-22 MED ORDER — IPRATROPIUM-ALBUTEROL 0.5-2.5 (3) MG/3ML IN SOLN
3.0000 mL | RESPIRATORY_TRACT | Status: DC | PRN
  Administered 2021-10-22 – 2021-10-29 (×6): 3 mL via RESPIRATORY_TRACT
  Filled 2021-10-22 (×5): qty 3

## 2021-10-22 MED ORDER — ORAL CARE MOUTH RINSE: 15.0000 mL | OROMUCOSAL | Status: DC | PRN

## 2021-10-22 MED ORDER — ACETAMINOPHEN 650 MG RE SUPP: 650.0000 mg | Freq: Four times a day (QID) | RECTAL | Status: DC | PRN

## 2021-10-22 MED ORDER — DILTIAZEM HCL ER COATED BEADS 120 MG PO CP24
240.0000 mg | ORAL_CAPSULE | Freq: Every day | ORAL | Status: DC
  Administered 2021-10-22 – 2021-10-30 (×9): 240 mg via ORAL
  Filled 2021-10-22 (×10): qty 2

## 2021-10-22 MED ORDER — ASPIRIN 81 MG PO TBEC
81.0000 mg | DELAYED_RELEASE_TABLET | Freq: Every day | ORAL | Status: DC
  Administered 2021-10-22 – 2021-10-30 (×9): 81 mg via ORAL
  Filled 2021-10-22 (×9): qty 1

## 2021-10-22 MED ORDER — TAMSULOSIN HCL 0.4 MG PO CAPS
0.4000 mg | ORAL_CAPSULE | Freq: Every day | ORAL | Status: DC
  Administered 2021-10-22 – 2021-10-30 (×9): 0.4 mg via ORAL
  Filled 2021-10-22 (×9): qty 1

## 2021-10-22 NOTE — Progress Notes (Signed)
PROGRESS NOTE    Wayne Meadows  XBD:[id number] DOB: Oct 08, 1934 DOA: 10/21/2021 PCP: [name]   Brief Narrative: Wayne Meadows is a 87 y.o. male with history of paroxysmal atrial fibrillation, CAD, hypothyroidism, COPD, chronic respiratory failure with hypoxia, ESRD on HD who presented secondary to severe back pain.  CT chest obtained on admission which is negative for PE but significant for new T5 and T7 compression fractures in addition to previously known rib fractures.  Imaging was also concerning for possible aspiration pneumonia and patient was started on empiric Unasyn IV.  Patient was also started on pain medication for management of acute back pain from compression fractures.   Assessment and Plan:  Aspiration pneumonia Noted on imaging.  Patient without significant clinical symptoms.  Patient started empirically on Unasyn.  Associated mild leukocytosis  Acute on chronic respiratory failure with hypoxia Secondary to aspiration pneumonia.  Patient requiring up to 4 L/min of oxygen with associated mild tachypnea on admission. -Wean oxygen back to chronic 2.5 L/min  ESRD on HD Nephrology consulted on admission and are managing hemodialysis while inpatient.  Hyperkalemia Management with hemodialysis  Vertebral compression fractures Bilateral rib fractures Patient started on port of care with analgesic regimen.  Patient initially started on Dilaudid IV for management. -Start Norco as needed and continue Dilaudid IV for breakthrough  Paroxysmal atrial fibrillation Patient is in normal sinus rhythm.  Patient is prescribed diltiazem and metoprolol for management as an outpatient, however he is listed as not taking diltiazem.  Patient is not on anticoagulation as an outpatient. -Continue diltiazem and metoprolol  Hyperbilirubinemia Mild mostly unconjugated/indirect bilirubin.  Patient with known anemia which appears to be stable. -Obtain LDH and  haptoglobin -Recheck early Ruben in a.m.  CAD No chest pain. -Continue aspirin and pravastatin  Hypothyroidism -Continue Synthroid  COPD Asymptomatic except for worsened hypoxia which is likely secondary to aspiration pneumonia.  Patient is managed on Combivent/DuoNebs as an outpatient.  Patient is not on controller medication. -Continue DuoNeb as needed   DVT prophylaxis: Heparin subcu Code Status:   Code Status: Full Code Family Communication: None at bedside Disposition Plan: Discharge back to SNF once pain is better controlled   Consultants:  None  Procedures:  None  Antimicrobials: Unasyn IV    Subjective: Patient reports no dyspnea this morning. Some cough.   Objective: BP (!) 148/57 (BP Location: Left Arm)   Pulse 94   Temp 99.2 F (37.3 C)   Resp 18   Ht '6\' 2"'$  (1.88 m)   Wt 76.4 kg   SpO2 91%   BMI 21.63 kg/m   Examination:  General exam: Appears calm and comfortable Respiratory system: Respiratory effort normal. Cardiovascular system: S1 & S2 heard, RRR. No murmurs, rubs, gallops or clicks. Gastrointestinal system: Abdomen is distended, soft and non-tender. Ventral hernia. Decreased bowel sounds heard. Central nervous system: Alert and oriented. No focal neurological deficits. Musculoskeletal: No edema. No calf tenderness Skin: No cyanosis. No rashes Psychiatry: Judgement and insight appear normal. Mood & affect appropriate.    Data Reviewed: I have personally reviewed following labs and imaging studies  CBC Lab Results  Component Value Date   WBC 10.6 (H) 10/22/2021   RBC 3.25 (L) 10/22/2021   HGB 9.7 (L) 10/22/2021   HCT 29.9 (L) 10/22/2021   MCV 92.0 10/22/2021   MCH 29.8 10/22/2021   PLT 321 10/22/2021   MCHC 32.4 10/22/2021   RDW 17.1 (H) 10/22/2021   LYMPHSABS 0.9 10/21/2021  MONOABS 0.9 10/21/2021   EOSABS 0.3 10/21/2021   BASOSABS 0.0 73/71/0626     Last metabolic panel Lab Results  Component Value Date   NA 135  10/22/2021   K 4.0 10/22/2021   CL 97 (L) 10/22/2021   CO2 23 10/22/2021   BUN 33 (H) 10/22/2021   CREATININE 4.61 (H) 10/22/2021   GLUCOSE 74 10/22/2021   GFRNONAA 12 (L) 10/22/2021   GFRAA 52 (L) 07/31/2017   CALCIUM 9.0 10/22/2021   PHOS 6.8 (H) 09/30/2021   PROT 5.9 (L) 10/22/2021   ALBUMIN 2.8 (L) 10/22/2021   BILITOT 1.5 (H) 10/22/2021   ALKPHOS 158 (H) 10/22/2021   AST 14 (L) 10/22/2021   ALT 13 10/22/2021   ANIONGAP 15 10/22/2021    GFR: Estimated Creatinine Clearance: 12.9 mL/min (A) (by C-G formula based on SCr of 4.61 mg/dL (H)).  Recent Results (from the past 240 hour(s))  SARS Coronavirus 2 by RT PCR (hospital order, performed in [facility] hospital lab) *cepheid single result test* Anterior Nasal Swab     Status: None   Collection Time: 10/21/21  3:54 PM   Specimen: Anterior Nasal Swab  Result Value Ref Range Status   SARS Coronavirus 2 by RT PCR NEGATIVE NEGATIVE Final    Comment: (NOTE) SARS-CoV-2 target nucleic acids are NOT DETECTED.  The SARS-CoV-2 RNA is generally detectable in upper and lower respiratory specimens during the acute phase of infection. The lowest concentration of SARS-CoV-2 viral copies this assay can detect is 250 copies / mL. A negative result does not preclude SARS-CoV-2 infection and should not be used as the sole basis for treatment or other patient management decisions.  A negative result may occur with improper specimen collection / handling, submission of specimen other than nasopharyngeal swab, presence of viral mutation(s) within the areas targeted by this assay, and inadequate number of viral copies (<250 copies / mL). A negative result must be combined with clinical observations, patient history, and epidemiological information.  Fact Sheet for Patients:   https://www.patel.info/  Fact Sheet for Healthcare Providers: https://hall.com/  This test is not yet approved or  cleared  by the [place] FDA and has been authorized for detection and/or diagnosis of SARS-CoV-2 by FDA under an Emergency Use Authorization (EUA).  This EUA will remain in effect (meaning this test can be used) for the duration of the COVID-19 declaration under Section 564(b)(1) of the Act, 21 U.S.C. section 360bbb-3(b)(1), unless the authorization is terminated or revoked sooner.  Performed at [facility], [place] [address] [place], [place] [address]   MRSA Next Gen by PCR, Nasal     Status: Abnormal   Collection Time: 10/22/21  2:05 AM   Specimen: Nasal Mucosa; Nasal Swab  Result Value Ref Range Status   MRSA by PCR Next Gen DETECTED (A) NOT DETECTED Final    Comment: CRITICAL RESULT CALLED TO, READ BACK BY AND VERIFIED WITH:  Prescott Gum, RN 10/22/21 0545 A. LAFRANCE (NOTE) The GeneXpert MRSA Assay (FDA approved for NASAL specimens only), is one component of a comprehensive MRSA colonization surveillance program. It is not intended to diagnose MRSA infection nor to guide or monitor treatment for MRSA infections. Test performance is not FDA approved in patients less than 40 years old. Performed at [facility], [place] [address] [place], [place] [address]   Blood culture (routine x 2)     Status: None (Preliminary result)   Collection Time: 10/22/21  3:13 AM  Specimen: BLOOD LEFT HAND  Result Value Ref Range Status   Specimen Description BLOOD LEFT HAND  Final   Special Requests   Final    BOTTLES DRAWN AEROBIC AND ANAEROBIC Blood Culture adequate volume   Culture   Final    NO GROWTH < 12 HOURS Performed at [facility], [address] [place], [place] [address]    Report Status PENDING  Incomplete  Blood culture (routine x 2)     Status: None (Preliminary result)   Collection Time: 10/22/21  3:18 AM   Specimen: BLOOD LEFT ARM  Result Value Ref Range Status   Specimen Description BLOOD LEFT ARM  Final   Special Requests   Final    BOTTLES DRAWN AEROBIC  AND ANAEROBIC Blood Culture adequate volume   Culture   Final    NO GROWTH < 12 HOURS Performed at [facility], [place] [address] [place], [place] [address]    Report Status PENDING  Incomplete      Radiology Studies: CT Angio Chest PE W and/or Wo Contrast  Result Date: 10/21/2021 CLINICAL DATA:  87 year old with history of worsening RIGHT-sided pleuritic chest pain presents for evaluation for pulmonary embolism. EXAM: CT ANGIOGRAPHY CHEST WITH CONTRAST TECHNIQUE: Multidetector CT imaging of the chest was performed using the standard protocol during bolus administration of intravenous contrast. Multiplanar CT image reconstructions and MIPs were obtained to evaluate the vascular anatomy. RADIATION DOSE REDUCTION: This exam was performed according to the departmental dose-optimization program which includes automated exposure control, adjustment of the mA and/or kV according to patient size and/or use of iterative reconstruction technique. CONTRAST:  23m OMNIPAQUE IOHEXOL 350 MG/ML SOLN COMPARISON:  August 15, 2021. FINDINGS: Cardiovascular: Calcified aortic atherosclerosis also with noncalcified atherosclerotic plaque. Normal caliber of the thoracic aorta without acute process. Heart size is top normal. No pericardial effusion or nodularity. Three-vessel coronary artery disease. Extensive coronary artery calcifications. Central pulmonary vasculature opacified to 236 Hounsfield units, moderate study quality. The study is negative for pulmonary embolism to the segmental level. Subsegmental branches are not well evaluated due to bolus timing Mediastinum/Nodes: No acute process or signs of adenopathy in the mediastinum. Lungs/Pleura: Signs of pulmonary emphysema. No pneumothorax. Pulmonary emphysema is moderate to marked and worse at the lung apices. Small LEFT effusion and LEFT basilar airspace disease. Material in LEFT lower lobe bronchi and mild bronchial wall thickening. Small Bochdalek's hernia  containing fat on the RIGHT. Basilar atelectasis mixed with airspace disease and material in RIGHT lower lobe bronchi as well. Bronchial wall thickening to the RIGHT lung base. Upper Abdomen: Incidental imaging of upper abdominal contents without acute process. Musculoskeletal: Signs of spinal fusion. Osteopenia. Spinal fusion in the upper lumbar spine is incompletely assessed. Subacute rib fractures in the bilateral chest without change compared to previous imaging. Visualized clavicles and scapulae as well as the sternum are unremarkable. Signs of recent compression fractures in the thoracic spine which have occurred since August 15, 2021 showing T5 with 50-60% loss of height along the anterior endplate. No signs of central canal impingement or posterior retropulsion of fracture elements. Sclerosis at this level suggests this may be subacute. Inferior endplate of T7 is fractured leading to approximately 40% loss of height at this level. Kyphotic angulation at the thoracolumbar transition is similar to previous imaging. This area and the fusion are incompletely assessed. Review of the MIP images confirms the above findings. IMPRESSION: 1. No evidence of pulmonary embolism to the segmental level. 2. New T5  and T7 compression fractures. T5 may be subacute but both appear recent and are new since previous imaging. Correlate with pain in these areas. 3. Bilateral subacute rib fractures with similar appearance to recent imaging. 4. Bibasilar airspace disease and material within lower lobe bronchi suspicious for developing aspiration related pneumonia. 5. Pulmonary emphysema and aortic atherosclerosis. 6. Three-vessel coronary artery disease. Aortic Atherosclerosis (ICD10-I70.0) and Emphysema (ICD10-J43.9). Electronically Signed   By: Zetta Bills M.D.   On: 10/21/2021 18:44   VAS [place] LOWER EXTREMITY VENOUS (DVT) (ONLY [facility] & [facility])  Result Date: 10/21/2021  Lower Venous DVT Study Patient Name:  Wayne Meadows  Date of  Exam:   10/21/2021 Medical Rec #: [id number]         Accession #:    [id number] Date of Birth: [birth year]        Patient Gender: M Patient Age:   50 years Exam Location:  [facility] Procedure:      VAS [place] LOWER EXTREMITY VENOUS (DVT) Referring Phys: Marda Stalker --------------------------------------------------------------------------------  Indications: Pain.  Comparison Study: 03/08/21 prior Performing Technologist: Archie Patten RVS  Examination Guidelines: A complete evaluation includes B-mode imaging, spectral Doppler, color Doppler, and power Doppler as needed of all accessible portions of each vessel. Bilateral testing is considered an integral part of a complete examination. Limited examinations for reoccurring indications may be performed as noted. The reflux portion of the exam is performed with the patient in reverse Trendelenburg.  +-----+---------------+---------+-----------+----------+--------------+ RIGHTCompressibilityPhasicitySpontaneityPropertiesThrombus Aging +-----+---------------+---------+-----------+----------+--------------+ CFV  Full           Yes      Yes                                 +-----+---------------+---------+-----------+----------+--------------+   +---------+---------------+---------+-----------+----------+--------------+ LEFT     CompressibilityPhasicitySpontaneityPropertiesThrombus Aging +---------+---------------+---------+-----------+----------+--------------+ CFV      Full           Yes      Yes                                 +---------+---------------+---------+-----------+----------+--------------+ SFJ      Full                                                        +---------+---------------+---------+-----------+----------+--------------+ FV Prox  Full                                                        +---------+---------------+---------+-----------+----------+--------------+ FV Mid   Full                                                         +---------+---------------+---------+-----------+----------+--------------+ FV DistalFull                                                        +---------+---------------+---------+-----------+----------+--------------+  PFV      Full                                                        +---------+---------------+---------+-----------+----------+--------------+ POP      Full           Yes      Yes                                 +---------+---------------+---------+-----------+----------+--------------+ PTV      Full                                                        +---------+---------------+---------+-----------+----------+--------------+ PERO     Full                                                        +---------+---------------+---------+-----------+----------+--------------+     Summary: RIGHT: - No evidence of common femoral vein obstruction.  LEFT: - There is no evidence of deep vein thrombosis in the lower extremity.  - No cystic structure found in the popliteal fossa.  *See table(s) above for measurements and observations. Electronically signed by Deitra Mayo MD on 10/21/2021 at 4:55:40 PM.    Final    DG Chest Portable 1 View  Result Date: 10/21/2021 CLINICAL DATA:  Chest pain short of breath EXAM: PORTABLE CHEST 1 VIEW COMPARISON:  Chest 09/28/2021 FINDINGS: COPD with hyperinflation. Progression of bibasilar airspace disease. Possible pneumonia versus atelectasis Negative for heart failure.  Small left effusion IMPRESSION: COPD with bibasilar airspace disease, possible pneumonia. Small left pleural effusion. Electronically Signed   By: Franchot Gallo M.D.   On: 10/21/2021 16:21      LOS: 1 day    Cordelia Poche, MD [facility] 10/22/2021, 8:26 AM   If 7PM-7AM, please contact night-coverage [email or web address]

## 2021-10-22 NOTE — Hospital Course (Signed)
Wayne Meadows is a 87 y.o. male with history of paroxysmal atrial fibrillation, CAD, hypothyroidism, COPD, chronic respiratory failure with hypoxia, ESRD on HD who presented secondary to severe back pain.  CT chest obtained on admission which is negative for PE but significant for new T5 and T7 compression fractures in addition to previously known rib fractures.  Imaging was also concerning for possible aspiration pneumonia and patient was started on empiric Unasyn IV.  Patient was also started on pain medication for management of acute back pain from compression fractures.

## 2021-10-22 NOTE — Progress Notes (Signed)
New Admission Note:  Arrival Method: Stretcher from HD Mental Orientation: Alert and oriented x 4 Telemetry: Box 14 Afib  Assessment: Completed Skin: Warm and dry. Scabs BLE IV: NSL  Pain: 10/10 Back  Tubes: Safety Measures: Safety Fall Prevention Plan initiated.  Admission: Completed 5 M  Orientation: Patient has been orientated to the room, unit and the staff. Welcome booklet given.  Family: None  Orders have been reviewed and implemented. Will continue to monitor the patient. Call light has been placed within reach and bed alarm has been activated.   Sima Matas BSN, RN  Phone Number: [phone]

## 2021-10-22 NOTE — Plan of Care (Signed)
  Problem: Activity: Goal: Ability to tolerate increased activity will improve Outcome: Progressing   

## 2021-10-22 NOTE — Progress Notes (Signed)
Physical Therapy Evaluation Patient Details Name: Wayne Meadows MRN: [id number] DOB: 1934-12-21 Today's Date: 10/22/2021  History of Present Illness  87 y.o. male presenting to the emergency department with severe back pain and missed HD treatment. Fell 2 weeks ago with +rib fractures (improving) but worsening back pain. CTA chest is negative for PE but notable for new T5 and T7 compression fractures. PMH significant for paroxysmal atrial fibrillation not anticoagulated, CAD, hypothyroidism, COPD with chronic hypoxic respiratory failure, and ESRD on hemodialysis, now  Clinical Impression   Pt admitted secondary to problem above with deficits below. PTA patient was resident at [facility] undergoing ST-SNF for rehab. He was ambulatory with RW.  Pt currently requires minguard assist to ambulate 100 ft with RW and O2 '@4L'$ .  Anticipate patient will benefit from PT to address problems listed below.Will continue to follow acutely to maximize functional mobility independence and safety.          Recommendations for follow up therapy are one component of a multi-disciplinary discharge planning process, led by the attending physician.  Recommendations may be updated based on patient status, additional functional criteria and insurance authorization.  Follow Up Recommendations Skilled nursing-short term rehab (<3 hours/day) Can patient physically be transported by private vehicle: Yes    Assistance Recommended at Discharge Frequent or constant Supervision/Assistance  Patient can return home with the following  A little help with walking and/or transfers;A little help with bathing/dressing/bathroom;Assistance with cooking/housework;Assist for transportation;Help with stairs or ramp for entrance    Equipment Recommendations None recommended by PT  Recommendations for Other Services       Functional Status Assessment Patient has had a recent decline in their functional status and demonstrates the  ability to make significant improvements in function in a reasonable and predictable amount of time.     Precautions / Restrictions Precautions Precautions: Fall Precaution Comments: has had several falls; monitor sats Restrictions Weight Bearing Restrictions: No      Mobility  Bed Mobility Overal bed mobility: Needs Assistance Bed Mobility: Rolling, Sidelying to Sit, Sit to Supine Rolling: Modified independent (Device/Increase time) Sidelying to sit: Supervision   Sit to supine: Supervision   General bed mobility comments: pt able to come to EOB without physical assist with HOB elevated; utilized rolling and side to sit; return to bed pt moved sit to supine and reported incr back pain (recommended sit to side as less painful)    Transfers Overall transfer level: Needs assistance Equipment used: Rolling walker (2 wheels) Transfers: Sit to/from Stand Sit to Stand: Min guard           General transfer comment: increased time needed and close guarding but pt able to rise safely to RW    Ambulation/Gait Ambulation/Gait assistance: Min guard Gait Distance (Feet): 100 Feet Assistive device: Rolling walker (2 wheels) Gait Pattern/deviations: Step-through pattern, Decreased stride length Gait velocity: decreased     General Gait Details: HR to 108, sats down to 86%on 4L; with seated rest x 1.5 minutes up to 90%  Stairs            Wheelchair Mobility    Modified Rankin (Stroke Patients Only)       Balance Overall balance assessment: History of Falls, Needs assistance Sitting-balance support: No upper extremity supported, Feet supported Sitting balance-Leahy Scale: Good     Standing balance support: Bilateral upper extremity supported, During functional activity Standing balance-Leahy Scale: Poor Standing balance comment: reliant on UE support for safety  Pertinent Vitals/Pain Pain Assessment Pain Assessment:  0-10 Pain Score: 5  Pain Location: back Pain Descriptors / Indicators: Sore, Sharp Pain Intervention(s): Limited activity within patient's tolerance, Monitored during session, Premedicated before session, Repositioned    Home Living Family/patient expects to be discharged to:: Skilled nursing facility                   Additional Comments: Pt plans to return to [facility] though hoping to get back home soon    Prior Function Prior Level of Function : Needs assist       Physical Assist : Mobility (physical)     Mobility Comments: pt reports that he was able to progress his ambulation to utilizing RW for long distance ambulation and independent with out AD to go from bed to bathroom ADLs Comments: pt reports that he was able to independently bathe and dress and that he was able to work in [employer] and do [employer]   Dominant Hand: Right    Extremity/Trunk Assessment   Upper Extremity Assessment Upper Extremity Assessment: Overall WFL for tasks assessed    Lower Extremity Assessment LLE Deficits / Details: still recovering from pelvic fx, able to lift LLE against gravity LLE Sensation: WNL LLE Coordination: WNL    Cervical / Trunk Assessment Cervical / Trunk Assessment: Kyphotic  Communication   Communication: HOH  Cognition Arousal/Alertness: Awake/alert Behavior During Therapy: WFL for tasks assessed/performed Overall Cognitive Status: Within Functional Limits for tasks assessed                                          General Comments      Exercises     Assessment/Plan    PT Assessment Patient needs continued PT services  PT Problem List Decreased strength;Decreased activity tolerance;Decreased balance;Decreased mobility;Pain;Cardiopulmonary status limiting activity       PT Treatment Interventions DME instruction;Gait training;Functional mobility training;Therapeutic activities;Therapeutic exercise;Balance  training;Patient/family education    PT Goals (Current goals can be found in the Care Plan section)  Acute Rehab PT Goals Patient Stated Goal: return to [facility] but ultimately return home PT Goal Formulation: With patient Time For Goal Achievement: 11/05/21 Potential to Achieve Goals: Good    Frequency Min 2X/week     Co-evaluation               AM-PAC PT "6 Clicks" Mobility  Outcome Measure Help needed turning from your back to your side while in a flat bed without using bedrails?: None Help needed moving from lying on your back to sitting on the side of a flat bed without using bedrails?: None Help needed moving to and from a bed to a chair (including a wheelchair)?: A Little Help needed standing up from a chair using your arms (e.g., wheelchair or bedside chair)?: A Little Help needed to walk in hospital room?: A Little Help needed climbing 3-5 steps with a railing? : A Lot 6 Click Score: 19    End of Session Equipment Utilized During Treatment: Gait belt;Oxygen Activity Tolerance: Patient limited by fatigue Patient left: with call bell/phone within reach;in bed (pt refused to sit up in chair) Nurse Communication: Mobility status PT Visit Diagnosis: Unsteadiness on feet (R26.81);Muscle weakness (generalized) (M62.81);Difficulty in walking, not elsewhere classified (R26.2);Pain Pain - Right/Left:  (midline) Pain - part of body:  (back)    Time: 1130-1145 PT Time  Calculation (min) (ACUTE ONLY): 15 min   Charges:   PT Evaluation $PT Eval Low Complexity: [place], PT Acute Rehabilitation Services  Office [phone]   Rexanne Mano 10/22/2021, 12:00 PM

## 2021-10-22 NOTE — Progress Notes (Signed)
PT Cancellation Note  Patient Details Name: Wayne Meadows MRN: [id number] DOB: 1934-09-30   Cancelled Treatment:    Reason Eval/Treat Not Completed: Pain limiting ability to participate  Patient recently up to [facility] (confirmed with RN) and adamantly refusing to get OOB or attempt walking at this time. Noted pt recently had IV pain medicine and explained rationale for trying now while pain medicine most effective. Pt continued to refuse. Will reattempt later this morning.    [place]  Office [phone]  Rexanne Mano 10/22/2021, 8:52 AM

## 2021-10-22 NOTE — Progress Notes (Signed)
[facility] Progress Note   Subjective:   Pt reports feeling worse today, says his chest feels congested. Denies CP, palpitations, dizziness, nausea and chills.   Objective Vitals:   10/22/21 0030 10/22/21 0139 10/22/21 0504 10/22/21 0928  BP: (!) 142/70 (!) 145/57 (!) 148/57 (!) 130/50  Pulse: (!) 131 (!) 59 94 90  Resp: '12 20 18 18  '$ Temp: 98.2 F (36.8 C) 98.4 F (36.9 C) 99.2 F (37.3 C) 99.1 F (37.3 C)  TempSrc: Oral Oral  Oral  SpO2: 99% 95% 91% 94%  Weight:  76.4 kg    Height:  '6\' 2"'$  (1.88 m)     Physical Exam General: Alert male in NAD Heart: RRR, no murmurs, rubs or gallops Lungs: + rhonchi throughout bilateral lungs. Respirations unlabored Abdomen: Soft, non-distended, +BS Extremities: trace pretibial edema Dialysis Access: RUE AVF + bruit  Additional Objective Labs: Basic Metabolic Panel: Recent Labs  Lab 10/21/21 1554 10/22/21 0252  NA 131* 135  K 5.6* 4.0  CL 93* 97*  CO2 23 23  GLUCOSE 102* 74  BUN 72* 33*  CREATININE 8.35* 4.61*  CALCIUM 8.7* 9.0   Liver Function Tests: Recent Labs  Lab 10/21/21 1554 10/22/21 0252  AST 16 14*  ALT 13 13  ALKPHOS 185* 158*  BILITOT 1.3* 1.5*  PROT 6.5 5.9*  ALBUMIN 3.0* 2.8*   Recent Labs  Lab 10/21/21 1554  LIPASE 23   CBC: Recent Labs  Lab 10/21/21 1554 10/22/21 0252  WBC 10.8* 10.6*  NEUTROABS 8.6*  --   HGB 10.2* 9.7*  HCT 31.2* 29.9*  MCV 92.0 92.0  PLT 316 321   Blood Culture    Component Value Date/Time   SDES BLOOD LEFT ARM 10/22/2021 0318   SPECREQUEST  10/22/2021 0318    BOTTLES DRAWN AEROBIC AND ANAEROBIC Blood Culture adequate volume   CULT  10/22/2021 0318    NO GROWTH < 12 HOURS Performed at [facility], [place] [address], [place], [place] [address]    REPTSTATUS PENDING 10/22/2021 [phone]    Cardiac Enzymes: No results for input(s): "CKTOTAL", "CKMB", "CKMBINDEX", "TROPONINI" in the last 168 hours. CBG: No results for input(s): "GLUCAP" in the last  168 hours. Iron Studies: No results for input(s): "IRON", "TIBC", "TRANSFERRIN", "FERRITIN" in the last 72 hours. '@lablastinr3'$ @ Studies/Results: CT Angio Chest PE W and/or Wo Contrast  Result Date: 10/21/2021 CLINICAL DATA:  87 year old with history of worsening RIGHT-sided pleuritic chest pain presents for evaluation for pulmonary embolism. EXAM: CT ANGIOGRAPHY CHEST WITH CONTRAST TECHNIQUE: Multidetector CT imaging of the chest was performed using the standard protocol during bolus administration of intravenous contrast. Multiplanar CT image reconstructions and MIPs were obtained to evaluate the vascular anatomy. RADIATION DOSE REDUCTION: This exam was performed according to the departmental dose-optimization program which includes automated exposure control, adjustment of the mA and/or kV according to patient size and/or use of iterative reconstruction technique. CONTRAST:  100m OMNIPAQUE IOHEXOL 350 MG/ML SOLN COMPARISON:  August 15, 2021. FINDINGS: Cardiovascular: Calcified aortic atherosclerosis also with noncalcified atherosclerotic plaque. Normal caliber of the thoracic aorta without acute process. Heart size is top normal. No pericardial effusion or nodularity. Three-vessel coronary artery disease. Extensive coronary artery calcifications. Central pulmonary vasculature opacified to 236 Hounsfield units, moderate study quality. The study is negative for pulmonary embolism to the segmental level. Subsegmental branches are not well evaluated due to bolus timing Mediastinum/Nodes: No acute process or signs of adenopathy in the mediastinum. Lungs/Pleura: Signs of pulmonary emphysema. No pneumothorax.  Pulmonary emphysema is moderate to marked and worse at the lung apices. Small LEFT effusion and LEFT basilar airspace disease. Material in LEFT lower lobe bronchi and mild bronchial wall thickening. Small Bochdalek's hernia containing fat on the RIGHT. Basilar atelectasis mixed with airspace disease and material  in RIGHT lower lobe bronchi as well. Bronchial wall thickening to the RIGHT lung base. Upper Abdomen: Incidental imaging of upper abdominal contents without acute process. Musculoskeletal: Signs of spinal fusion. Osteopenia. Spinal fusion in the upper lumbar spine is incompletely assessed. Subacute rib fractures in the bilateral chest without change compared to previous imaging. Visualized clavicles and scapulae as well as the sternum are unremarkable. Signs of recent compression fractures in the thoracic spine which have occurred since August 15, 2021 showing T5 with 50-60% loss of height along the anterior endplate. No signs of central canal impingement or posterior retropulsion of fracture elements. Sclerosis at this level suggests this may be subacute. Inferior endplate of T7 is fractured leading to approximately 40% loss of height at this level. Kyphotic angulation at the thoracolumbar transition is similar to previous imaging. This area and the fusion are incompletely assessed. Review of the MIP images confirms the above findings. IMPRESSION: 1. No evidence of pulmonary embolism to the segmental level. 2. New T5 and T7 compression fractures. T5 may be subacute but both appear recent and are new since previous imaging. Correlate with pain in these areas. 3. Bilateral subacute rib fractures with similar appearance to recent imaging. 4. Bibasilar airspace disease and material within lower lobe bronchi suspicious for developing aspiration related pneumonia. 5. Pulmonary emphysema and aortic atherosclerosis. 6. Three-vessel coronary artery disease. Aortic Atherosclerosis (ICD10-I70.0) and Emphysema (ICD10-J43.9). Electronically Signed   By: Zetta Bills M.D.   On: 10/21/2021 18:44   VAS [place] LOWER EXTREMITY VENOUS (DVT) (ONLY [facility] & [facility])  Result Date: 10/21/2021  Lower Venous DVT Study Patient Name:  Wayne Meadows  Date of Exam:   10/21/2021 Medical Rec #: [id number]         Accession #:    [id number] Date of  Birth: [birth year]        Patient Gender: M Patient Age:   29 years Exam Location[facility]  [facility] Procedure:      VAS [place] LOWER EXTREMITY VENOUS (DVT) Referring Phys: Marda Stalker --------------------------------------------------------------------------------  Indications: Pain.  Comparison Study: 03/08/21 prior Performing Technologist: Archie Patten RVS  Examination Guidelines: A complete evaluation includes B-mode imaging, spectral Doppler, color Doppler, and power Doppler as needed of all accessible portions of each vessel. Bilateral testing is considered an integral part of a complete examination. Limited examinations for reoccurring indications may be performed as noted. The reflux portion of the exam is performed with the patient in reverse Trendelenburg.  +-----+---------------+---------+-----------+----------+--------------+ RIGHTCompressibilityPhasicitySpontaneityPropertiesThrombus Aging +-----+---------------+---------+-----------+----------+--------------+ CFV  Full           Yes      Yes                                 +-----+---------------+---------+-----------+----------+--------------+   +---------+---------------+---------+-----------+----------+--------------+ LEFT     CompressibilityPhasicitySpontaneityPropertiesThrombus Aging +---------+---------------+---------+-----------+----------+--------------+ CFV      Full           Yes      Yes                                 +---------+---------------+---------+-----------+----------+--------------+ SFJ  Full                                                        +---------+---------------+---------+-----------+----------+--------------+ FV Prox  Full                                                        +---------+---------------+---------+-----------+----------+--------------+ FV Mid   Full                                                         +---------+---------------+---------+-----------+----------+--------------+ FV DistalFull                                                        +---------+---------------+---------+-----------+----------+--------------+ PFV      Full                                                        +---------+---------------+---------+-----------+----------+--------------+ POP      Full           Yes      Yes                                 +---------+---------------+---------+-----------+----------+--------------+ PTV      Full                                                        +---------+---------------+---------+-----------+----------+--------------+ PERO     Full                                                        +---------+---------------+---------+-----------+----------+--------------+     Summary: RIGHT: - No evidence of common femoral vein obstruction.  LEFT: - There is no evidence of deep vein thrombosis in the lower extremity.  - No cystic structure found in the popliteal fossa.  *See table(s) above for measurements and observations. Electronically signed by Deitra Mayo MD on 10/21/2021 at 4:55:40 PM.    Final    DG Chest Portable 1 View  Result Date: 10/21/2021 CLINICAL DATA:  Chest pain short of breath EXAM: PORTABLE CHEST 1 VIEW COMPARISON:  Chest 09/28/2021 FINDINGS: COPD with hyperinflation. Progression of bibasilar airspace disease. Possible pneumonia versus atelectasis Negative for heart failure.  Small left effusion IMPRESSION: COPD with bibasilar airspace disease,  possible pneumonia. Small left pleural effusion. Electronically Signed   By: Franchot Gallo M.D.   On: 10/21/2021 16:21   Medications:  ampicillin-sulbactam (UNASYN) IV 1.5 g (10/22/21 0811)    aspirin EC  81 mg Oral Daily   busPIRone  5 mg Oral BID   Chlorhexidine Gluconate Cloth  6 each Topical Q0600   diltiazem  240 mg Oral Daily   heparin  5,000 Units Subcutaneous Q8H    levothyroxine  50 mcg Oral QAC breakfast   lidocaine  1 patch Transdermal Daily   metoprolol tartrate  12.5 mg Oral BID   mupirocin ointment  1 Application Nasal BID   naphazoline-glycerin  1-2 drop Right Eye TID   neomycin-bacitracin-polymyxin  1 Application Topical TID   mouth rinse  15 mL Mouth Rinse 4 times per day   pravastatin  40 mg Oral QHS   senna  2 tablet Oral QHS   sodium chloride flush  3 mL Intravenous Q12H   tamsulosin  0.4 mg Oral Daily    Outpatient Dialysis Orders: MWF [facility]  4h  300/500  71 kg  3K/2.5Ca bath  Heparin 3000  RUE AVF - mircera 75 mcg IVP q2 weeks - iron sucrose '50mg'$  q wk    Assessment/Plan:  Back and abdominal pain - associated with SOB.  CT consistent with pneumonia. Unfortunately HD did not help symptoms.   Plan per primary.  Lower extremity duplex without evidence of DVT.  Pain management per primary but avoid morphine and baclofen. Avoid nephrotoxic medications including NSAIDs and iodinated intravenous contrast exposure unless the latter is absolutely indicated.   Preferred narcotic agents for pain control are hydromorphone, fentanyl, and methadone. Morphine should not be used.  Avoid Baclofen and avoid oral sodium phosphate and magnesium citrate based laxatives / bowel preps.   ESRD -  missed HD Monday, had HD last night. Will plan for short treatment this evening to get back on schedule  Hypertension/volume  - bp stable but some volume overload.  UF as tolerated  Anemia  - Hgb stable, continue with ESA and follow H/H.  Metabolic bone disease -  Corrected calcium elevated. No VDRA. continue with home meds  Nutrition - renal diet.    Anice Paganini, PA-C 10/22/2021, 10:50 AM  [employer] Pager: [phone]

## 2021-10-22 NOTE — Evaluation (Signed)
Clinical/Bedside Swallow Evaluation Patient Details  Name: Wayne Meadows MRN: [id number] Date of Birth: [birth year]  Today's Date: 10/22/2021 Time: SLP Start Time (ACUTE ONLY): 3220 SLP Stop Time (ACUTE ONLY): 2542 SLP Time Calculation (min) (ACUTE ONLY): 10 min  Past Medical History:  Past Medical History:  Diagnosis Date   Abnormal echocardiogram 03/08/2021   Abnormal finding on GI tract imaging    Acute blood loss anemia    Acute pancreatitis 08/12/2020   Arthritis    Carotid stenosis    Community acquired pneumonia 04/06/2021   COPD (chronic obstructive pulmonary disease) ([place])    Coronary artery disease    S/p PCI 2011;  NSTEMI 12/12:  LHC/PCI 02/23/11: LAD 60% after the septal perforator, D1 occluded with distal collaterals, proximal RI 30-40%, AV circumflex stent patent with 60% stenosis after the stent, RCA 99%, EF 60-65%.  His RCA was treated with a bare-metal stent   CVA (cerebral infarction) 2011   Right cerebral; total obstruction of the right ICA   Diverticulitis    Hypertension    Paroxysmal atrial fibrillation with RVR ([place]) 04/09/2021   Pleuritic chest pain 04/08/2014   Pressure injury of skin 03/30/2021   Rash 03/05/2021   Rectal bleeding 07/2015   Refractory nausea and vomiting 06/20/2020   Renal carcinoma (H[facility])    Sepsis, unspecified organism ([place]) 04/11/2016   Stroke ([place])    Tobacco abuse, in remission    Past Surgical History:  Past Surgical History:  Procedure Laterality Date   AV FISTULA PLACEMENT Right 04/03/2021   Procedure: ARTERIOVENOUS (AV) CREATION OF RIGHT ARM BRACHIOCEPHALIC FISTULA;  Surgeon: Cherre Robins, MD;  Location: [place];  Service: Vascular;  Laterality: Right;   BIOPSY  04/08/2021   Procedure: BIOPSY;  Surgeon: Lavena Bullion, DO;  Location: [place];  Service: Gastroenterology;;   COLON SURGERY     COLONOSCOPY N/A 08/22/2015   Procedure: COLONOSCOPY;  Surgeon: [name], MD;  Location: [facility];  Service: Endoscopy;   Laterality: N/A;   ESOPHAGOGASTRODUODENOSCOPY N/A 07/18/2020   Procedure: ESOPHAGOGASTRODUODENOSCOPY (EGD);  Surgeon: Milus Banister, MD;  Location: [facility];  Service: Endoscopy;  Laterality: N/A;   ESOPHAGOGASTRODUODENOSCOPY (EGD) WITH PROPOFOL N/A 06/21/2020   Procedure: ESOPHAGOGASTRODUODENOSCOPY (EGD) WITH PROPOFOL;  Surgeon: Irene Shipper, MD;  Location: [facility] ENDOSCOPY;  Service: Endoscopy;  Laterality: N/A;   ESOPHAGOGASTRODUODENOSCOPY (EGD) WITH PROPOFOL N/A 04/08/2021   Procedure: ESOPHAGOGASTRODUODENOSCOPY (EGD) WITH PROPOFOL;  Surgeon: Lavena Bullion, DO;  Location: [place];  Service: Gastroenterology;  Laterality: N/A;   EUS N/A 07/18/2020   Procedure: UPPER ENDOSCOPIC ULTRASOUND (EUS) RADIAL;  Surgeon: Milus Banister, MD;  Location: [facility];  Service: Endoscopy;  Laterality: N/A;   FINE NEEDLE ASPIRATION N/A 07/18/2020   Procedure: FINE NEEDLE ASPIRATION (FNA) LINEAR;  Surgeon: Milus Banister, MD;  Location[facility];  Service: Endoscopy;  Laterality: N/A;   HEMOSTASIS CLIP PLACEMENT  04/08/2021   Procedure: HEMOSTASIS CLIP PLACEMENT;  Surgeon: Lavena Bullion, DO;  Location: [facility];  Service: Gastroenterology;;   hip relacement     INCISION AND DRAINAGE Right 09/11/2021   Procedure: INCISION AND DRAINAGE OF RIGHT ARM FISTULA;  Surgeon: Cherre Robins, MD;  [facility];  Service: Vascular;  Laterality: Right;   IR FLUORO GUIDE CV LINE RIGHT  03/31/2021   IR REMOVAL TUN CV CATH W/O FL  08/15/2021   IR US GUIDE VASC ACCESS RIGHT  03/31/2021   KIDNEY SURGERY     LEFT HEART CATH AND  CORONARY ANGIOGRAPHY N/A 07/09/2020   Procedure: LEFT HEART CATH AND CORONARY ANGIOGRAPHY;  Surgeon: Leonie Man, MD;  Location: [facility] CV LAB;  Service: Cardiovascular;  Laterality: N/A;   LEFT HEART CATHETERIZATION WITH CORONARY ANGIOGRAM N/A 02/23/2011   Procedure: LEFT HEART CATHETERIZATION WITH CORONARY ANGIOGRAM;  Surgeon: [name], MD;  Location[facility]  LAB;  Service: Cardiovascular;  Laterality: N/A;   LEFT HEART CATHETERIZATION WITH CORONARY ANGIOGRAM N/A 03/18/2011   Procedure: LEFT HEART CATHETERIZATION WITH CORONARY ANGIOGRAM;  Surgeon: [name], MD;  Location: [facility] CATH LAB;  Service: Cardiovascular;  Laterality: N/A;   PERCUTANEOUS CORONARY STENT INTERVENTION (PCI-S) N/A 02/23/2011   Procedure: PERCUTANEOUS CORONARY STENT INTERVENTION (PCI-S);  Surgeon: [name], MD;  Location: [facility] CATH LAB;  Service: Cardiovascular;  Laterality: N/A;   TEMPORARY PACEMAKER INSERTION N/A 02/23/2011   Procedure: TEMPORARY PACEMAKER INSERTION;  Surgeon: [name], MD;  Location: [facility] CATH LAB;  Service: Cardiovascular;  Laterality: N/A;   THROMBECTOMY W/ EMBOLECTOMY Right 04/03/2021   Procedure: THROMBECTOMY ARTERIOVENOUS FISTULA;  Surgeon: Cherre Robins, MD;  Location: [facility] OR;  Service: Vascular;  Laterality: Right;   HPI:  Pt is a 87 y.o. male with medical history significant for, CAD, hypothyroidism, HTN, CVA, PAF, COPD with chronic hypoxic respiratory failure, and ESRD on hemodialysis, now presenting to the ED with severe back pain, possible pneumonia, small left pleural effusion and SOB. Pt has recent rib fractures from fall approximately 2 weeks ago.    Assessment / Plan / Recommendation  Clinical Impression   Pt reports pain levels are high and he is experiencing SOB. He was alert and able to follow directions. Oral motor function appeared WFL, except for missing dentiton. Volitional cough was strong. Pt given thin liquid, puree and regular consistency trials and displayed no s/sx of aspiration. WOB did not increase from baseline during trials. Observed pt taking multiple pills at once with water, consistent with trials, no s/sx of aspiration noted. Noted pt has history of COPD and educated regarding relationship between swallow and respiratory synchronicity. Pt educated on importance of upright positioning and taking rest breaks if SOB  increases while eating. No further tx recommended at this time.   SLP Visit Diagnosis: Dysphagia, unspecified (R13.10)    Aspiration Risk  No limitations    Diet Recommendation Regular;Thin liquid   Liquid Administration via: Cup;Straw Medication Administration: Whole meds with liquid Supervision: Patient able to self feed Compensations: Slow rate;Small sips/bites Postural Changes: Seated upright at 90 degrees    Other  Recommendations Oral Care Recommendations: Oral care BID    Recommendations for follow up therapy are one component of a multi-disciplinary discharge planning process, led by the attending physician.  Recommendations may be updated based on patient status, additional functional criteria and insurance authorization.  Follow up Recommendations No SLP follow up      Assistance Recommended at Discharge PRN  Functional Status Assessment Patient has not had a recent decline in their functional status  Frequency and Duration            Prognosis        Swallow Study   General Date of Onset: 10/21/21 HPI: Pt is a 87 y.o. male with medical history significant for, CAD, hypothyroidism, HTN, CVA, PAF, COPD with chronic hypoxic respiratory failure, and ESRD on hemodialysis, now presenting to the ED with severe back pain, possible pneumonia, small left pleural effusion and SOB. Pt has recent rib fractures from fall approximately 2 weeks ago.  Type of Study: Bedside Swallow Evaluation Diet Prior to this Study: NPO Temperature Spikes Noted: No Respiratory Status: Nasal cannula History of Recent Intubation: No Behavior/Cognition: Alert;Cooperative;Pleasant mood Oral Cavity Assessment: Within Functional Limits Oral Care Completed by SLP: No Oral Cavity - Dentition: Missing dentition;Poor condition Vision: Functional for self-feeding Self-Feeding Abilities: Able to feed self Patient Positioning: Upright in bed Baseline Vocal Quality: Normal Volitional Cough:  Strong Volitional Swallow: Able to elicit    Oral/Motor/Sensory Function Overall Oral Motor/Sensory Function: Within functional limits   Ice Chips Ice chips: Not tested   Thin Liquid Thin Liquid: Within functional limits Presentation: Cup;Self Fed    Nectar Thick Nectar Thick Liquid: Not tested   Honey Thick Honey Thick Liquid: Not tested   Puree Puree: Within functional limits Presentation: Spoon;Self Fed   Solid     Solid: Within functional limits Presentation: Fertile Student SLP    10/22/2021,11:49 AM

## 2021-10-23 DIAGNOSIS — N186 End stage renal disease: Secondary | ICD-10-CM | POA: Diagnosis not present

## 2021-10-23 DIAGNOSIS — J449 Chronic obstructive pulmonary disease, unspecified: Secondary | ICD-10-CM | POA: Diagnosis not present

## 2021-10-23 DIAGNOSIS — J690 Pneumonitis due to inhalation of food and vomit: Secondary | ICD-10-CM | POA: Diagnosis not present

## 2021-10-23 DIAGNOSIS — J9621 Acute and chronic respiratory failure with hypoxia: Secondary | ICD-10-CM | POA: Diagnosis not present

## 2021-10-23 LAB — BASIC METABOLIC PANEL
Anion gap: 9 (ref 5–15)
BUN: 23 mg/dL (ref 8–23)
CO2: 31 mmol/L (ref 22–32)
Calcium: 8.4 mg/dL — ABNORMAL LOW (ref 8.9–10.3)
Chloride: 98 mmol/L (ref 98–111)
Creatinine, Ser: 4.11 mg/dL — ABNORMAL HIGH (ref 0.61–1.24)
GFR, Estimated: 14 mL/min — ABNORMAL LOW (ref >60)
Glucose, Bld: 114 mg/dL — ABNORMAL HIGH (ref 70–99)
Potassium: 4.4 mmol/L (ref 3.5–5.1)
Sodium: 138 mmol/L (ref 135–145)

## 2021-10-23 LAB — CBC
HCT: 28.5 % — ABNORMAL LOW (ref 39.0–52.0)
Hemoglobin: 9.3 g/dL — ABNORMAL LOW (ref 13.0–17.0)
MCH: 30.2 pg (ref 26.0–34.0)
MCHC: 32.6 g/dL (ref 30.0–36.0)
MCV: 92.5 fL (ref 80.0–100.0)
Platelets: 284 10*3/uL (ref 150–400)
RBC: 3.08 MIL/uL — ABNORMAL LOW (ref 4.22–5.81)
RDW: 17.4 % — ABNORMAL HIGH (ref 11.5–15.5)
WBC: 7.3 10*3/uL (ref 4.0–10.5)
nRBC: 0 % (ref 0.0–0.2)

## 2021-10-23 LAB — PROCALCITONIN: Procalcitonin: 0.35 ng/mL

## 2021-10-23 LAB — HEPATITIS B SURFACE ANTIBODY, QUANTITATIVE: Hep B S AB Quant (Post): <3.1 m[IU]/mL — ABNORMAL LOW (ref >9.9)

## 2021-10-23 LAB — EXPECTORATED SPUTUM ASSESSMENT W GRAM STAIN, RFLX TO RESP C

## 2021-10-23 LAB — LACTATE DEHYDROGENASE: LDH: 97 U/L — ABNORMAL LOW (ref 98–192)

## 2021-10-23 LAB — BILIRUBIN, FRACTIONATED(TOT/DIR/INDIR)
Bilirubin, Direct: <0.1 mg/dL (ref 0.0–0.2)
Total Bilirubin: 0.4 mg/dL (ref 0.3–1.2)

## 2021-10-23 MED ORDER — ALBUTEROL SULFATE (2.5 MG/3ML) 0.083% IN NEBU
2.5000 mg | INHALATION_SOLUTION | Freq: Two times a day (BID) | RESPIRATORY_TRACT | Status: DC
  Administered 2021-10-23: 2.5 mg via RESPIRATORY_TRACT
  Filled 2021-10-23: qty 3

## 2021-10-23 MED ORDER — CHLORHEXIDINE GLUCONATE CLOTH 2 % EX PADS
6.0000 | MEDICATED_PAD | Freq: Every day | CUTANEOUS | Status: DC
  Administered 2021-10-23 – 2021-10-24 (×2): 6 via TOPICAL

## 2021-10-23 MED ORDER — GUAIFENESIN ER 600 MG PO TB12
600.0000 mg | ORAL_TABLET | Freq: Two times a day (BID) | ORAL | Status: DC | PRN
  Administered 2021-10-23: 600 mg via ORAL
  Filled 2021-10-23: qty 1

## 2021-10-23 MED ORDER — DIPHENHYDRAMINE HCL 25 MG PO CAPS
25.0000 mg | ORAL_CAPSULE | Freq: Once | ORAL | Status: AC | PRN
  Administered 2021-10-23: 25 mg via ORAL
  Filled 2021-10-23: qty 1

## 2021-10-23 MED ORDER — ALBUTEROL SULFATE (2.5 MG/3ML) 0.083% IN NEBU
2.5000 mg | INHALATION_SOLUTION | Freq: Four times a day (QID) | RESPIRATORY_TRACT | Status: DC
Start: 2021-10-24 — End: 2021-10-30
  Administered 2021-10-24 – 2021-10-30 (×20): 2.5 mg via RESPIRATORY_TRACT
  Filled 2021-10-23 (×23): qty 3

## 2021-10-23 MED ORDER — GUAIFENESIN ER 600 MG PO TB12
1200.0000 mg | ORAL_TABLET | Freq: Two times a day (BID) | ORAL | Status: DC
Start: 2021-10-23 — End: 2021-10-30
  Administered 2021-10-23 – 2021-10-29 (×14): 1200 mg via ORAL
  Filled 2021-10-23 (×14): qty 2

## 2021-10-23 MED ORDER — ALBUTEROL SULFATE (2.5 MG/3ML) 0.083% IN NEBU
2.5000 mg | INHALATION_SOLUTION | Freq: Two times a day (BID) | RESPIRATORY_TRACT | Status: DC
  Filled 2021-10-23: qty 3

## 2021-10-23 MED ORDER — ALBUTEROL SULFATE (2.5 MG/3ML) 0.083% IN NEBU
2.5000 mg | INHALATION_SOLUTION | Freq: Two times a day (BID) | RESPIRATORY_TRACT | Status: DC
Start: 2021-10-23 — End: 2021-10-23

## 2021-10-23 NOTE — Progress Notes (Signed)
Physical Therapy Treatment Patient Details Name: Wayne Meadows MRN: [id number] DOB: [birth year] Today's Date: 10/23/2021   History of Present Illness 87 y.o. male presenting to the emergency department with severe back pain and missed HD treatment. Fell 2 weeks ago with +rib fractures (improving) but worsening back pain. CTA chest is negative for PE but notable for new T5 and T7 compression fractures. PMH significant for paroxysmal atrial fibrillation not anticoagulated, CAD, hypothyroidism, COPD with chronic hypoxic respiratory failure, and ESRD on hemodialysis, now    PT Comments    On arrival, pt slouched down in the bed with sats 83% on 5L. Pt eager to sit up due to pain in ribs and back and feeling he can't breathe. At EOB with pursed lip breathing, able to improve to 85% over ~5 minutes. Pt wanted to attempt standing to take bigger, deeper breaths--however in standing sats down to 81% on 5L. RN in to assess with pt only recovering to 85% and RN increased to 6L O2. Again worked on pursed lip breathing and not talking with pt up to 89% at end of session. RN made MD and RT aware.     Recommendations for follow up therapy are one component of a multi-disciplinary discharge planning process, led by the attending physician.  Recommendations may be updated based on patient status, additional functional criteria and insurance authorization.  Follow Up Recommendations  Skilled nursing-short term rehab (<3 hours/day) Can patient physically be transported by private vehicle: Yes   Assistance Recommended at Discharge Frequent or constant Supervision/Assistance  Patient can return home with the following A little help with walking and/or transfers;A little help with bathing/dressing/bathroom;Assistance with cooking/housework;Assist for transportation;Help with stairs or ramp for entrance   Equipment Recommendations  None recommended by PT    Recommendations for Other Services        Precautions / Restrictions Precautions Precautions: Fall Precaution Comments: has had several falls; monitor sats Restrictions Weight Bearing Restrictions: No     Mobility  Bed Mobility Overal bed mobility: Needs Assistance Bed Mobility: Rolling, Sidelying to Sit, Sit to Supine Rolling: Modified independent (Device/Increase time) Sidelying to sit: Modified independent (Device/Increase time)       General bed mobility comments: pt able to come to EOB without physical assist with HOB elevated and use of rail; end of session wanted to remain sitting EOB with bed alarm set    Transfers Overall transfer level: Needs assistance Equipment used: Rolling walker (2 wheels) Transfers: Sit to/from Stand Sit to Stand: Min guard           General transfer comment: increased time needed and close guarding due to low sats, but pt able to rise safely to RW; repeated x 2 for up to  1 minute    Ambulation/Gait               General Gait Details: unable due to low sats   Stairs             Wheelchair Mobility    Modified Rankin (Stroke Patients Only)       Balance Overall balance assessment: History of Falls, Needs assistance Sitting-balance support: No upper extremity supported, Feet supported Sitting balance-Leahy Scale: Good     Standing balance support: Bilateral upper extremity supported, During functional activity Standing balance-Leahy Scale: Poor Standing balance comment: reliant on UE support for safety  Cognition Arousal/Alertness: Awake/alert Behavior During Therapy: WFL for tasks assessed/performed Overall Cognitive Status: Within Functional Limits for tasks assessed                                          Exercises Other Exercises Other Exercises: pursed lip breathing with attempts to incr sats    General Comments        Pertinent Vitals/Pain Pain Assessment Pain Assessment:  0-10 Faces Pain Scale: Hurts little more Pain Location: back Pain Descriptors / Indicators: Sore, Sharp Pain Intervention(s): Limited activity within patient's tolerance, Monitored during session    Home Living                          Prior Function            PT Goals (current goals can now be found in the care plan section) Acute Rehab PT Goals Patient Stated Goal: return to [facility] but ultimately return home PT Goal Formulation: With patient Time For Goal Achievement: 11/05/21 Potential to Achieve Goals: Good Progress towards PT goals: Not progressing toward goals - comment (dropping sats)    Frequency    Min 2X/week      PT Plan Current plan remains appropriate    Co-evaluation              AM-PAC PT "6 Clicks" Mobility   Outcome Measure  Help needed turning from your back to your side while in a flat bed without using bedrails?: None Help needed moving from lying on your back to sitting on the side of a flat bed without using bedrails?: None Help needed moving to and from a bed to a chair (including a wheelchair)?: A Little Help needed standing up from a chair using your arms (e.g., wheelchair or bedside chair)?: A Little Help needed to walk in hospital room?: A Little Help needed climbing 3-5 steps with a railing? : A Lot 6 Click Score: 19    End of Session Equipment Utilized During Treatment: Oxygen Activity Tolerance: Treatment limited secondary to medical complications (Comment) (sats 80-85%) Patient left: with call bell/phone within reach;in bed;with bed alarm set Nurse Communication: Mobility status;Other (comment) (low sats (RN in during session to assess); sitting on EOB with alarm set if he stands) PT Visit Diagnosis: Unsteadiness on feet (R26.81);Muscle weakness (generalized) (M62.81);Difficulty in walking, not elsewhere classified (R26.2);Pain Pain - Right/Left:  (midline) Pain - part of body:  (back)     Time: 6294-7654 PT  Time Calculation (min) (ACUTE ONLY): 35 min  Charges:  $Gait Training: 8-22 mins $Therapeutic Activity: 8-22 mins                      [place]  Office [phone]    Wayne Meadows 10/23/2021, 2:01 PM

## 2021-10-23 NOTE — Progress Notes (Addendum)
Pt receives out-pt HD at [facility] on MWF. Pt arrives at 12:15 for 12:35 chair time. Will assist as needed.   Melven Sartorius Renal Navigator [phone]

## 2021-10-23 NOTE — Progress Notes (Signed)
[facility] Progress Note   Subjective:   Pt feels too much fluid was removed yesterday with HD. Says he has been getting dizzy at the end of dialysis lately. Still reports chest congestion and cough. Denies CP, dizziness, abdominal pain and nausea at present.   Objective Vitals:   10/22/21 1940 10/22/21 1950 10/22/21 2025 10/22/21 2121  BP: (!) 155/72 (!) 155/72 (!) 147/49   Pulse: 86 86 88   Resp: '15 15 19   '$ Temp: 98.3 F (36.8 C) 98.3 F (36.8 C) 99.3 F (37.4 C)   TempSrc:   Oral   SpO2: 94% 94% 92% 92%  Weight:  74.4 kg    Height:       Physical Exam General: Alert male in NAD Heart: RRR, no murmurs, rubs or gallops Lungs: + rhonchi throughout bilateral lungs. Respirations unlabored on O2 via [place] Abdomen: Soft, non-distended, +BS Extremities: no edema b/l lower extremities Dialysis Access: RUE AVF + bruit  Additional Objective Labs: Basic Metabolic Panel: Recent Labs  Lab 10/21/21 1554 10/22/21 0252 10/23/21 0451  NA 131* 135 138  K 5.6* 4.0 4.4  CL 93* 97* 98  CO2 '23 23 31  '$ GLUCOSE 102* 74 114*  BUN 72* 33* 23  CREATININE 8.35* 4.61* 4.11*  CALCIUM 8.7* 9.0 8.4*   Liver Function Tests: Recent Labs  Lab 10/21/21 1554 10/22/21 0252 10/23/21 0451  AST 16 14*  --   ALT 13 13  --   ALKPHOS 185* 158*  --   BILITOT 1.3* 1.5* 0.4  PROT 6.5 5.9*  --   ALBUMIN 3.0* 2.8*  --    Recent Labs  Lab 10/21/21 1554  LIPASE 23   CBC: Recent Labs  Lab 10/21/21 1554 10/22/21 0252 10/23/21 0451  WBC 10.8* 10.6* 7.3  NEUTROABS 8.6*  --   --   HGB 10.2* 9.7* 9.3*  HCT 31.2* 29.9* 28.5*  MCV 92.0 92.0 92.5  PLT 316 321 284   Blood Culture    Component Value Date/Time   SDES BLOOD LEFT ARM 10/22/2021 0318   SPECREQUEST  10/22/2021 0318    BOTTLES DRAWN AEROBIC AND ANAEROBIC Blood Culture adequate volume   CULT  10/22/2021 0318    NO GROWTH 1 DAY Performed at [facility], [place] [address], [place], [place] [address]    REPTSTATUS  PENDING 10/22/2021 [id number]    Cardiac Enzymes: No results for input(s): "CKTOTAL", "CKMB", "CKMBINDEX", "TROPONINI" in the last 168 hours. CBG: No results for input(s): "GLUCAP" in the last 168 hours. Iron Studies: No results for input(s): "IRON", "TIBC", "TRANSFERRIN", "FERRITIN" in the last 72 hours. '@lablastinr3'$ @ Studies/Results: CT Angio Chest PE W and/or Wo Contrast  Result Date: 10/21/2021 CLINICAL DATA:  87 year old with history of worsening RIGHT-sided pleuritic chest pain presents for evaluation for pulmonary embolism. EXAM: CT ANGIOGRAPHY CHEST WITH CONTRAST TECHNIQUE: Multidetector CT imaging of the chest was performed using the standard protocol during bolus administration of intravenous contrast. Multiplanar CT image reconstructions and MIPs were obtained to evaluate the vascular anatomy. RADIATION DOSE REDUCTION: This exam was performed according to the departmental dose-optimization program which includes automated exposure control, adjustment of the mA and/or kV according to patient size and/or use of iterative reconstruction technique. CONTRAST:  78m OMNIPAQUE IOHEXOL 350 MG/ML SOLN COMPARISON:  August 15, 2021. FINDINGS: Cardiovascular: Calcified aortic atherosclerosis also with noncalcified atherosclerotic plaque. Normal caliber of the thoracic aorta without acute process. Heart size is top normal. No pericardial effusion or nodularity. Three-vessel coronary artery disease.  Extensive coronary artery calcifications. Central pulmonary vasculature opacified to 236 Hounsfield units, moderate study quality. The study is negative for pulmonary embolism to the segmental level. Subsegmental branches are not well evaluated due to bolus timing Mediastinum/Nodes: No acute process or signs of adenopathy in the mediastinum. Lungs/Pleura: Signs of pulmonary emphysema. No pneumothorax. Pulmonary emphysema is moderate to marked and worse at the lung apices. Small LEFT effusion and LEFT basilar airspace  disease. Material in LEFT lower lobe bronchi and mild bronchial wall thickening. Small Bochdalek's hernia containing fat on the RIGHT. Basilar atelectasis mixed with airspace disease and material in RIGHT lower lobe bronchi as well. Bronchial wall thickening to the RIGHT lung base. Upper Abdomen: Incidental imaging of upper abdominal contents without acute process. Musculoskeletal: Signs of spinal fusion. Osteopenia. Spinal fusion in the upper lumbar spine is incompletely assessed. Subacute rib fractures in the bilateral chest without change compared to previous imaging. Visualized clavicles and scapulae as well as the sternum are unremarkable. Signs of recent compression fractures in the thoracic spine which have occurred since August 15, 2021 showing T5 with 50-60% loss of height along the anterior endplate. No signs of central canal impingement or posterior retropulsion of fracture elements. Sclerosis at this level suggests this may be subacute. Inferior endplate of T7 is fractured leading to approximately 40% loss of height at this level. Kyphotic angulation at the thoracolumbar transition is similar to previous imaging. This area and the fusion are incompletely assessed. Review of the MIP images confirms the above findings. IMPRESSION: 1. No evidence of pulmonary embolism to the segmental level. 2. New T5 and T7 compression fractures. T5 may be subacute but both appear recent and are new since previous imaging. Correlate with pain in these areas. 3. Bilateral subacute rib fractures with similar appearance to recent imaging. 4. Bibasilar airspace disease and material within lower lobe bronchi suspicious for developing aspiration related pneumonia. 5. Pulmonary emphysema and aortic atherosclerosis. 6. Three-vessel coronary artery disease. Aortic Atherosclerosis (ICD10-I70.0) and Emphysema (ICD10-J43.9). Electronically Signed   By: Zetta Bills M.D.   On: 10/21/2021 18:44   VAS [place] LOWER EXTREMITY VENOUS (DVT)  (ONLY [facility] & [facility])  Result Date: 10/21/2021  Lower Venous DVT Study Patient Name:  KOHEI ANTONELLIS  Date of Exam:   10/21/2021 Medical Rec #: [id number]         Accession #:    [id number] Date of Birth: [birth year]        Patient Gender: M Patient Age:   69 years Exam Location:  [facility] Procedure:      VAS [place] LOWER EXTREMITY VENOUS (DVT) Referring Phys: Marda Stalker --------------------------------------------------------------------------------  Indications: Pain.  Comparison Study: 03/08/21 prior Performing Technologist: Archie Patten RVS  Examination Guidelines: A complete evaluation includes B-mode imaging, spectral Doppler, color Doppler, and power Doppler as needed of all accessible portions of each vessel. Bilateral testing is considered an integral part of a complete examination. Limited examinations for reoccurring indications may be performed as noted. The reflux portion of the exam is performed with the patient in reverse Trendelenburg.  +-----+---------------+---------+-----------+----------+--------------+ RIGHTCompressibilityPhasicitySpontaneityPropertiesThrombus Aging +-----+---------------+---------+-----------+----------+--------------+ CFV  Full           Yes      Yes                                 +-----+---------------+---------+-----------+----------+--------------+   +---------+---------------+---------+-----------+----------+--------------+ LEFT     CompressibilityPhasicitySpontaneityPropertiesThrombus Aging +---------+---------------+---------+-----------+----------+--------------+ CFV  Full           Yes      Yes                                 +---------+---------------+---------+-----------+----------+--------------+ SFJ      Full                                                        +---------+---------------+---------+-----------+----------+--------------+ FV Prox  Full                                                         +---------+---------------+---------+-----------+----------+--------------+ FV Mid   Full                                                        +---------+---------------+---------+-----------+----------+--------------+ FV DistalFull                                                        +---------+---------------+---------+-----------+----------+--------------+ PFV      Full                                                        +---------+---------------+---------+-----------+----------+--------------+ POP      Full           Yes      Yes                                 +---------+---------------+---------+-----------+----------+--------------+ PTV      Full                                                        +---------+---------------+---------+-----------+----------+--------------+ PERO     Full                                                        +---------+---------------+---------+-----------+----------+--------------+     Summary: RIGHT: - No evidence of common femoral vein obstruction.  LEFT: - There is no evidence of deep vein thrombosis in the lower extremity.  - No cystic structure found in the popliteal fossa.  *See table(s) above for measurements and observations. Electronically signed by Deitra Mayo MD on 10/21/2021 at 4:55:40 PM.  Final    DG Chest Portable 1 View  Result Date: 10/21/2021 CLINICAL DATA:  Chest pain short of breath EXAM: PORTABLE CHEST 1 VIEW COMPARISON:  Chest 09/28/2021 FINDINGS: COPD with hyperinflation. Progression of bibasilar airspace disease. Possible pneumonia versus atelectasis Negative for heart failure.  Small left effusion IMPRESSION: COPD with bibasilar airspace disease, possible pneumonia. Small left pleural effusion. Electronically Signed   By: Franchot Gallo M.D.   On: 10/21/2021 16:21   Medications:  ampicillin-sulbactam (UNASYN) IV 1.5 g (10/23/21 0813)    aspirin EC  81 mg Oral Daily   busPIRone  5 mg  Oral BID   Chlorhexidine Gluconate Cloth  6 each Topical Q0600   Chlorhexidine Gluconate Cloth  6 each Topical Q0600   diltiazem  240 mg Oral Daily   heparin  5,000 Units Subcutaneous Q8H   levothyroxine  50 mcg Oral QAC breakfast   lidocaine  1 patch Transdermal Daily   metoprolol tartrate  12.5 mg Oral BID   mupirocin ointment  1 Application Nasal BID   naphazoline-glycerin  1-2 drop Right Eye TID   neomycin-bacitracin-polymyxin  1 Application Topical TID   mouth rinse  15 mL Mouth Rinse 4 times per day   pravastatin  40 mg Oral QHS   senna  2 tablet Oral QHS   sodium chloride flush  3 mL Intravenous Q12H   tamsulosin  0.4 mg Oral Daily    Outpatient Dialysis Orders: MWF [facility]  4h  300/500  77 kg  3K/2.5Ca bath  Heparin 3000  RUE AVF - mircera 75 mcg IVP q2 weeks- last dose 10/15/21 - iron sucrose '50mg'$  q wk   Assessment/Plan:  Back and abdominal pain - associated with SOB.  CT consistent with pneumonia. On IV abx. Unfortunately HD did not help symptoms.   Plan per primary.  Lower extremity duplex without evidence of DVT.  Pain management per primary but avoid morphine and baclofen. Preferred narcotic agents for pain control are hydromorphone, fentanyl, and methadone. Morphine should not be used.  Avoid Baclofen and avoid oral sodium phosphate and magnesium citrate based laxatives / bowel preps.   ESRD -  missed HD Monday, had HD Tuesday and Wednesday. Next HD Friday.   Hypertension/volume  - bp stable, edema improved and now below his previous EDW. Does not tolerate very high UF goals.   Anemia  - Hgb 9.3. ESA recently given outpatient. No IV Fe with acute infection.   Metabolic bone disease -  Corrected calcium elevated but improving. No VDRA. Will check phos with next labs. continue with home meds  Nutrition - on a regular diet, follow K+ and phos trends      Anice Paganini, PA-C 10/23/2021, 8:38 AM  [facility] Pager: [phone]

## 2021-10-23 NOTE — Progress Notes (Signed)
  Progress Note   Date: 10/23/2021  Patient Name: Wayne Meadows        MRN#: [id number]  Clarification of diagnosis:  Anemia of CKD

## 2021-10-23 NOTE — Progress Notes (Signed)
PROGRESS NOTE    [name]  WRU:[id number] DOB: 1934-05-15 DOA: 10/21/2021 PCP: [name]   Brief Narrative: Wayne Meadows is a 87 y.o. male with history of paroxysmal atrial fibrillation, CAD, hypothyroidism, COPD, chronic respiratory failure with hypoxia, ESRD on HD who presented secondary to severe back pain.  CT chest obtained on admission which is negative for PE but significant for new T5 and T7 compression fractures in addition to previously known rib fractures.  Imaging was also concerning for possible aspiration pneumonia and patient was started on empiric Unasyn IV.  Patient was also started on pain medication for management of acute back pain from compression fractures.   Assessment and Plan:  Aspiration pneumonia Noted on imaging.  Patient without significant clinical symptoms.  Patient started empirically on Unasyn.  Associated mild leukocytosis  Acute on chronic respiratory failure with hypoxia Secondary to aspiration pneumonia.  Patient requiring up to 4 L/min of oxygen with associated mild tachypnea on admission. -Wean oxygen back to chronic 2.5 L/min -Continuous pulse ox  ESRD on HD Nephrology consulted on admission and are managing hemodialysis while inpatient.  Hyperkalemia Management with hemodialysis  Vertebral compression fractures Bilateral rib fractures Patient started on port of care with analgesic regimen.  Patient initially started on Dilaudid IV for management. -Continue Norco as needed and continue Dilaudid IV for breakthrough  Paroxysmal atrial fibrillation Patient is in normal sinus rhythm.  Patient is prescribed diltiazem and metoprolol for management as an outpatient, however he is listed as not taking diltiazem.  Patient is not on anticoagulation as an outpatient. -Continue diltiazem and metoprolol  Hyperbilirubinemia Mild mostly unconjugated/indirect bilirubin.  Patient with known anemia which appears to be stable.  Resolved this morning. LDH low. Haptoglobin pending.  CAD No chest pain. -Continue aspirin and pravastatin  Hypothyroidism -Continue Synthroid  COPD Initially asymptomatic but symptoms may be worsening secondary to aspiration pneumonia. Worsening wheezing today. Continued hypoxia. Productive cough. -Scheduled Duonebs -Continue treatment for pneumonia -Flutter valve -Will consider steroids if he does not respond well to nebulizer treatments   DVT prophylaxis: Heparin subcu Code Status:   Code Status: Full Code Family Communication: None at bedside Disposition Plan: Discharge back to SNF once pain is better controlled   Consultants:  None  Procedures:  None  Antimicrobials: Unasyn IV    Subjective: Patient with dyspnea this morning. Patient reports having some issues with coughing up phlegm and states phlegm is thicker.   Objective: BP 137/75 (BP Location: Left Arm)   Pulse 86   Temp 99 F (37.2 C) (Oral)   Resp 20   Ht '6\' 2"'$  (1.88 m)   Wt 74.4 kg   SpO2 93%   BMI 21.06 kg/m   Examination:  General exam: Appears calm and comfortable Respiratory system: Diffuse wheezing. Respiratory effort normal. Cardiovascular system: S1 & S2 heard, RRR. Gastrointestinal system: Abdomen is distended, soft and non-tender. Normal bowel sounds heard. Central nervous system: Alert and oriented. No focal neurological deficits. Musculoskeletal: No edema. No calf tenderness Skin: No cyanosis. No rashes Psychiatry: Judgement and insight appear normal. Mood & affect appropriate.    Data Reviewed: I have personally reviewed following labs and imaging studies  CBC Lab Results  Component Value Date   WBC 7.3 10/23/2021   RBC 3.08 (L) 10/23/2021   HGB 9.3 (L) 10/23/2021   HCT 28.5 (L) 10/23/2021   MCV 92.5 10/23/2021   MCH 30.2 10/23/2021   PLT 284 10/23/2021   MCHC 32.6 10/23/2021  RDW 17.4 (H) 10/23/2021   LYMPHSABS 0.9 10/21/2021   MONOABS 0.9 10/21/2021   EOSABS 0.3  10/21/2021   BASOSABS 0.0 01/60/1093     Last metabolic panel Lab Results  Component Value Date   NA 138 10/23/2021   K 4.4 10/23/2021   CL 98 10/23/2021   CO2 31 10/23/2021   BUN 23 10/23/2021   CREATININE 4.11 (H) 10/23/2021   GLUCOSE 114 (H) 10/23/2021   GFRNONAA 14 (L) 10/23/2021   GFRAA 52 (L) 07/31/2017   CALCIUM 8.4 (L) 10/23/2021   PHOS 6.8 (H) 09/30/2021   PROT 5.9 (L) 10/22/2021   ALBUMIN 2.8 (L) 10/22/2021   BILITOT 0.4 10/23/2021   ALKPHOS 158 (H) 10/22/2021   AST 14 (L) 10/22/2021   ALT 13 10/22/2021   ANIONGAP 9 10/23/2021    GFR: Estimated Creatinine Clearance: 14.1 mL/min (A) (by C-G formula based on SCr of 4.11 mg/dL (H)).  Recent Results (from the past 240 hour(s))  SARS Coronavirus 2 by RT PCR (hospital order, performed in [facility] hospital lab) *cepheid single result test* Anterior Nasal Swab     Status: None   Collection Time: 10/21/21  3:54 PM   Specimen: Anterior Nasal Swab  Result Value Ref Range Status   SARS Coronavirus 2 by RT PCR NEGATIVE NEGATIVE Final    Comment: (NOTE) SARS-CoV-2 target nucleic acids are NOT DETECTED.  The SARS-CoV-2 RNA is generally detectable in upper and lower respiratory specimens during the acute phase of infection. The lowest concentration of SARS-CoV-2 viral copies this assay can detect is 250 copies / mL. A negative result does not preclude SARS-CoV-2 infection and should not be used as the sole basis for treatment or other patient management decisions.  A negative result may occur with improper specimen collection / handling, submission of specimen other than nasopharyngeal swab, presence of viral mutation(s) within the areas targeted by this assay, and inadequate number of viral copies (<250 copies / mL). A negative result must be combined with clinical observations, patient history, and epidemiological information.  Fact Sheet for Patients:   https://www.patel.info/  Fact Sheet for  Healthcare Providers: https://hall.com/  This test is not yet approved or  cleared by the [place] FDA and has been authorized for detection and/or diagnosis of SARS-CoV-2 by FDA under an Emergency Use Authorization (EUA).  This EUA will remain in effect (meaning this test can be used) for the duration of the COVID-19 declaration under Section 564(b)(1) of the Act, 21 U.S.C. section 360bbb-3(b)(1), unless the authorization is terminated or revoked sooner.  Performed at [facility], [place] [address], [place], [place] [address]   MRSA Next Gen by PCR, Nasal     Status: Abnormal   Collection Time: 10/22/21  2:05 AM   Specimen: Nasal Mucosa; Nasal Swab  Result Value Ref Range Status   MRSA by PCR Next Gen DETECTED (A) NOT DETECTED Final    Comment: CRITICAL RESULT CALLED TO, READ BACK BY AND VERIFIED WITH:  Prescott Gum, RN 10/22/21 0545 A. LAFRANCE (NOTE) The GeneXpert MRSA Assay (FDA approved for NASAL specimens only), is one component of a comprehensive MRSA colonization surveillance program. It is not intended to diagnose MRSA infection nor to guide or monitor treatment for MRSA infections. Test performance is not FDA approved in patients less than 65 years old. Performed at [facility], [place] [address], [place],  [address]   Blood culture (routine x 2)     Status: None (Preliminary result)  Collection Time: 10/22/21  3:13 AM   Specimen: BLOOD LEFT HAND  Result Value Ref Range Status   Specimen Description BLOOD LEFT HAND  Final   Special Requests   Final    BOTTLES DRAWN AEROBIC AND ANAEROBIC Blood Culture adequate volume   Culture   Final    NO GROWTH 1 DAY Performed at [facility], [place]  [address], [place], [place] [address]    Report Status PENDING  Incomplete  Blood culture (routine x 2)     Status: None (Preliminary result)   Collection Time: 10/22/21  3:18 AM   Specimen: BLOOD LEFT ARM  Result Value Ref Range  Status   Specimen Description BLOOD LEFT ARM  Final   Special Requests   Final    BOTTLES DRAWN AEROBIC AND ANAEROBIC Blood Culture adequate volume   Culture   Final    NO GROWTH 1 DAY Performed at [facility], [place] [address], [place], [place] [address]    Report Status PENDING  Incomplete      Radiology Studies: CT Angio Chest PE W and/or Wo Contrast  Result Date: 10/21/2021 CLINICAL DATA:  87 year old with history of worsening RIGHT-sided pleuritic chest pain presents for evaluation for pulmonary embolism. EXAM: CT ANGIOGRAPHY CHEST WITH CONTRAST TECHNIQUE: Multidetector CT imaging of the chest was performed using the standard protocol during bolus administration of intravenous contrast. Multiplanar CT image reconstructions and MIPs were obtained to evaluate the vascular anatomy. RADIATION DOSE REDUCTION: This exam was performed according to the departmental dose-optimization program which includes automated exposure control, adjustment of the mA and/or kV according to patient size and/or use of iterative reconstruction technique. CONTRAST:  28m OMNIPAQUE IOHEXOL 350 MG/ML SOLN COMPARISON:  August 15, 2021. FINDINGS: Cardiovascular: Calcified aortic atherosclerosis also with noncalcified atherosclerotic plaque. Normal caliber of the thoracic aorta without acute process. Heart size is top normal. No pericardial effusion or nodularity. Three-vessel coronary artery disease. Extensive coronary artery calcifications. Central pulmonary vasculature opacified to 236 Hounsfield units, moderate study quality. The study is negative for pulmonary embolism to the segmental level. Subsegmental branches are not well evaluated due to bolus timing Mediastinum/Nodes: No acute process or signs of adenopathy in the mediastinum. Lungs/Pleura: Signs of pulmonary emphysema. No pneumothorax. Pulmonary emphysema is moderate to marked and worse at the lung apices. Small LEFT effusion and LEFT basilar airspace disease.  Material in LEFT lower lobe bronchi and mild bronchial wall thickening. Small Bochdalek's hernia containing fat on the RIGHT. Basilar atelectasis mixed with airspace disease and material in RIGHT lower lobe bronchi as well. Bronchial wall thickening to the RIGHT lung base. Upper Abdomen: Incidental imaging of upper abdominal contents without acute process. Musculoskeletal: Signs of spinal fusion. Osteopenia. Spinal fusion in the upper lumbar spine is incompletely assessed. Subacute rib fractures in the bilateral chest without change compared to previous imaging. Visualized clavicles and scapulae as well as the sternum are unremarkable. Signs of recent compression fractures in the thoracic spine which have occurred since August 15, 2021 showing T5 with 50-60% loss of height along the anterior endplate. No signs of central canal impingement or posterior retropulsion of fracture elements. Sclerosis at this level suggests this may be subacute. Inferior endplate of T7 is fractured leading to approximately 40% loss of height at this level. Kyphotic angulation at the thoracolumbar transition is similar to previous imaging. This area and the fusion are incompletely assessed. Review of the MIP images confirms the above findings. IMPRESSION: 1. No evidence of pulmonary embolism to  the segmental level. 2. New T5 and T7 compression fractures. T5 may be subacute but both appear recent and are new since previous imaging. Correlate with pain in these areas. 3. Bilateral subacute rib fractures with similar appearance to recent imaging. 4. Bibasilar airspace disease and material within lower lobe bronchi suspicious for developing aspiration related pneumonia. 5. Pulmonary emphysema and aortic atherosclerosis. 6. Three-vessel coronary artery disease. Aortic Atherosclerosis (ICD10-I70.0) and Emphysema (ICD10-J43.9). Electronically Signed   By: Zetta Bills M.D.   On: 10/21/2021 18:44   VAS [place] LOWER EXTREMITY VENOUS (DVT) (ONLY [facility] &  [facility])  Result Date: 10/21/2021  Lower Venous DVT Study Patient Name:  Wayne Meadows  Date of Exam:   10/21/2021 Medical Rec #: [id number]         Accession #:    [id number] Date of Birth: 16-May-1934        Patient Gender: M Patient Age:   6 years Exam Location:  [facility] Procedure:      VAS [place] LOWER EXTREMITY VENOUS (DVT) Referring Phys: Marda Stalker --------------------------------------------------------------------------------  Indications: Pain.  Comparison Study: 03/08/21 prior Performing Technologist: Archie Patten RVS  Examination Guidelines: A complete evaluation includes B-mode imaging, spectral Doppler, color Doppler, and power Doppler as needed of all accessible portions of each vessel. Bilateral testing is considered an integral part of a complete examination. Limited examinations for reoccurring indications may be performed as noted. The reflux portion of the exam is performed with the patient in reverse Trendelenburg.  +-----+---------------+---------+-----------+----------+--------------+ RIGHTCompressibilityPhasicitySpontaneityPropertiesThrombus Aging +-----+---------------+---------+-----------+----------+--------------+ CFV  Full           Yes      Yes                                 +-----+---------------+---------+-----------+----------+--------------+   +---------+---------------+---------+-----------+----------+--------------+ LEFT     CompressibilityPhasicitySpontaneityPropertiesThrombus Aging +---------+---------------+---------+-----------+----------+--------------+ CFV      Full           Yes      Yes                                 +---------+---------------+---------+-----------+----------+--------------+ SFJ      Full                                                        +---------+---------------+---------+-----------+----------+--------------+ FV Prox  Full                                                         +---------+---------------+---------+-----------+----------+--------------+ FV Mid   Full                                                        +---------+---------------+---------+-----------+----------+--------------+ FV DistalFull                                                        +---------+---------------+---------+-----------+----------+--------------+  PFV      Full                                                        +---------+---------------+---------+-----------+----------+--------------+ POP      Full           Yes      Yes                                 +---------+---------------+---------+-----------+----------+--------------+ PTV      Full                                                        +---------+---------------+---------+-----------+----------+--------------+ PERO     Full                                                        +---------+---------------+---------+-----------+----------+--------------+     Summary: RIGHT: - No evidence of common femoral vein obstruction.  LEFT: - There is no evidence of deep vein thrombosis in the lower extremity.  - No cystic structure found in the popliteal fossa.  *See table(s) above for measurements and observations. Electronically signed by Deitra Mayo MD on 10/21/2021 at 4:55:40 PM.    Final    DG Chest Portable 1 View  Result Date: 10/21/2021 CLINICAL DATA:  Chest pain short of breath EXAM: PORTABLE CHEST 1 VIEW COMPARISON:  Chest 09/28/2021 FINDINGS: COPD with hyperinflation. Progression of bibasilar airspace disease. Possible pneumonia versus atelectasis Negative for heart failure.  Small left effusion IMPRESSION: COPD with bibasilar airspace disease, possible pneumonia. Small left pleural effusion. Electronically Signed   By: Franchot Gallo M.D.   On: 10/21/2021 16:21      LOS: 2 days    Cordelia Poche, MD [facility] 10/23/2021, 10:01 AM   If 7PM-7AM, please contact  night-coverage [email or web address]

## 2021-10-24 ENCOUNTER — Inpatient Hospital Stay (HOSPITAL_COMMUNITY): Payer: No Typology Code available for payment source

## 2021-10-24 DIAGNOSIS — J9621 Acute and chronic respiratory failure with hypoxia: Secondary | ICD-10-CM | POA: Diagnosis not present

## 2021-10-24 DIAGNOSIS — N186 End stage renal disease: Secondary | ICD-10-CM | POA: Diagnosis not present

## 2021-10-24 DIAGNOSIS — J690 Pneumonitis due to inhalation of food and vomit: Secondary | ICD-10-CM | POA: Diagnosis not present

## 2021-10-24 DIAGNOSIS — J449 Chronic obstructive pulmonary disease, unspecified: Secondary | ICD-10-CM | POA: Diagnosis not present

## 2021-10-24 LAB — RENAL FUNCTION PANEL
Albumin: 2.4 g/dL — ABNORMAL LOW (ref 3.5–5.0)
Anion gap: 15 (ref 5–15)
BUN: 36 mg/dL — ABNORMAL HIGH (ref 8–23)
CO2: 27 mmol/L (ref 22–32)
Calcium: 8.6 mg/dL — ABNORMAL LOW (ref 8.9–10.3)
Chloride: 97 mmol/L — ABNORMAL LOW (ref 98–111)
Creatinine, Ser: 6.07 mg/dL — ABNORMAL HIGH (ref 0.61–1.24)
GFR, Estimated: 9 mL/min — ABNORMAL LOW (ref >60)
Glucose, Bld: 96 mg/dL (ref 70–99)
Phosphorus: 5.9 mg/dL — ABNORMAL HIGH (ref 2.5–4.6)
Potassium: 4.3 mmol/L (ref 3.5–5.1)
Sodium: 139 mmol/L (ref 135–145)

## 2021-10-24 LAB — CBC
HCT: 29.5 % — ABNORMAL LOW (ref 39.0–52.0)
Hemoglobin: 9.4 g/dL — ABNORMAL LOW (ref 13.0–17.0)
MCH: 30 pg (ref 26.0–34.0)
MCHC: 31.9 g/dL (ref 30.0–36.0)
MCV: 94.2 fL (ref 80.0–100.0)
Platelets: 255 10*3/uL (ref 150–400)
RBC: 3.13 MIL/uL — ABNORMAL LOW (ref 4.22–5.81)
RDW: 17.3 % — ABNORMAL HIGH (ref 11.5–15.5)
WBC: 7.6 10*3/uL (ref 4.0–10.5)
nRBC: 0 % (ref 0.0–0.2)

## 2021-10-24 LAB — HAPTOGLOBIN: Haptoglobin: 241 mg/dL (ref 38–329)

## 2021-10-24 MED ORDER — HYDROCODONE-ACETAMINOPHEN 10-325 MG PO TABS
1.0000 | ORAL_TABLET | Freq: Four times a day (QID) | ORAL | Status: DC | PRN
  Administered 2021-10-24 – 2021-10-30 (×17): 1 via ORAL
  Filled 2021-10-24 (×17): qty 1

## 2021-10-24 MED ORDER — LIDOCAINE-PRILOCAINE 2.5-2.5 % EX CREA
1.0000 | TOPICAL_CREAM | CUTANEOUS | Status: DC | PRN
  Filled 2021-10-24: qty 5

## 2021-10-24 MED ORDER — PENTAFLUOROPROP-TETRAFLUOROETH EX AERO: 1.0000 | INHALATION_SPRAY | CUTANEOUS | Status: DC | PRN

## 2021-10-24 MED ORDER — LIDOCAINE HCL (PF) 1 % IJ SOLN: 5.0000 mL | INTRAMUSCULAR | Status: DC | PRN

## 2021-10-24 MED ORDER — HYDROXYZINE HCL 10 MG PO TABS
10.0000 mg | ORAL_TABLET | Freq: Four times a day (QID) | ORAL | Status: DC | PRN
Start: 2021-10-24 — End: 2021-10-30
  Administered 2021-10-24 – 2021-10-29 (×10): 10 mg via ORAL
  Filled 2021-10-24 (×11): qty 1

## 2021-10-24 NOTE — Consult Note (Signed)
WOC Nurse Consult Note: Patient receiving care in [facility] [phone]. Reason for Consult: back wound Wound type: Patient explains this is a trauma injury that resulted from an xray board being slid under him in April of this year Pressure Injury POA: NA Measurement: round, 0.3 cm in diameter x 0.8 cm depth with 1.3 cm circumferential undermining Wound bed: cannot be visualized Drainage (amount, consistency, odor) no drainage or substance detected when undermining probed with the cotton tipped applicator Periwound: intact Dressing procedure/placement/frequency: Wash wound on lower mid spine area with saline, pat dry. Place a foam dressing over the area. Change every 3 days and prn.  This topical approach was provided by myself today at the time of my visit.  Monitor the wound area(s) for worsening of condition such as: Signs/symptoms of infection,  Increase in size,  Development of or worsening of odor, Development of pain, or increased pain at the affected locations.  Notify the medical team if any of these develop.  Thank you for the consult.  Discussed plan of care with the patient and bedside nurse.  [facility] nurse will not follow at this time.  Please re-consult the [facility] team if needed.  Val Riles, RN, MSN, CWOCN, CNS-BC, pager [phone]

## 2021-10-24 NOTE — Plan of Care (Signed)
  Problem: Activity: Goal: Ability to tolerate increased activity will improve Outcome: Progressing   Problem: Clinical Measurements: Goal: Ability to maintain a body temperature in the normal range will improve Outcome: Progressing   Problem: Respiratory: Goal: Ability to maintain adequate ventilation will improve Outcome: Progressing   

## 2021-10-24 NOTE — Progress Notes (Signed)
Physical Therapy Treatment Patient Details Name: Wayne Meadows MRN: [id number] DOB: October 25, 1934 Today's Date: 10/24/2021   History of Present Illness 87 y.o. male presenting to the emergency department with severe back pain and missed HD treatment. Fell 2 weeks ago with +rib fractures (improving) but worsening back pain. CTA chest is negative for PE but notable for new T5 and T7 compression fractures. PMH significant for paroxysmal atrial fibrillation not anticoagulated, CAD, hypothyroidism, COPD with chronic hypoxic respiratory failure, and ESRD on hemodialysis, now    PT Comments    Pt with improved O2 saturation today on 6L O2 via HFNC. 93%O2 at rest,dropped to 80%O2 with mobilization to chair, ~4 min of pursed lipped breathing able to recover to 90%O2. Pt overall min guard for bed mobility and transfer to chair. D/c plan remains appropriate. PT will continue to follow acutely.    Recommendations for follow up therapy are one component of a multi-disciplinary discharge planning process, led by the attending physician.  Recommendations may be updated based on patient status, additional functional criteria and insurance authorization.  Follow Up Recommendations  Skilled nursing-short term rehab (<3 hours/day) Can patient physically be transported by private vehicle: Yes   Assistance Recommended at Discharge Frequent or constant Supervision/Assistance  Patient can return home with the following A little help with walking and/or transfers;A little help with bathing/dressing/bathroom;Assistance with cooking/housework;Assist for transportation;Help with stairs or ramp for entrance   Equipment Recommendations  None recommended by PT    Recommendations for Other Services       Precautions / Restrictions Precautions Precautions: Fall Precaution Comments: has had several falls; monitor sats Restrictions Weight Bearing Restrictions: No     Mobility  Bed Mobility Overal bed mobility:  Needs Assistance Bed Mobility: Supine to Sit     Supine to sit: Modified independent (Device/Increase time), HOB elevated     General bed mobility comments: pt able to come to EOB without physical assist with HOB elevated and use of rail, recommended log roll to reduce back and rib pain, pt not interested    Transfers Overall transfer level: Needs assistance Equipment used: Rolling walker (2 wheels) Transfers: Sit to/from Stand, Bed to chair/wheelchair/BSC Sit to Stand: Min guard, Min assist   Step pivot transfers: Min guard       General transfer comment: pt with reports of increased pain with pushing through B UE to scoot to EoB, provided pt min A for pad scoot prior to power up, min guard for power up into standing, min guard for stepping to recliner    Ambulation/Gait               General Gait Details: unable due to low sats       Balance Overall balance assessment: History of Falls, Needs assistance Sitting-balance support: No upper extremity supported, Feet supported Sitting balance-Leahy Scale: Good     Standing balance support: Bilateral upper extremity supported, During functional activity Standing balance-Leahy Scale: Poor Standing balance comment: reliant on UE support for safety                            Cognition Arousal/Alertness: Awake/alert Behavior During Therapy: WFL for tasks assessed/performed Overall Cognitive Status: Within Functional Limits for tasks assessed  Exercises Other Exercises Other Exercises: pursed lip breathing with attempts to incr sats    General Comments General comments (skin integrity, edema, etc.): Pt on 6L O2 via HFNC on entry with SpO2 93%O2, with transfer to chair SpO2 dropped to 80%O2, pt able to perform pursed lip breathing to recover, taking approx 4 min to rebound to 90%O2, RT in room      Pertinent Vitals/Pain Pain Assessment Pain  Assessment: 0-10 Pain Score: 5  Pain Location: anterior B ribs while slouched down in the bed, posterior ribs with pushing up to standing and relief with sitting up in recliner Pain Descriptors / Indicators: Sore, Sharp Pain Intervention(s): Limited activity within patient's tolerance, Monitored during session, Repositioned     PT Goals (current goals can now be found in the care plan section) Acute Rehab PT Goals Patient Stated Goal: return to [facility] but ultimately return home PT Goal Formulation: With patient Time For Goal Achievement: 11/05/21 Potential to Achieve Goals: Good Progress towards PT goals: Progressing toward goals    Frequency    Min 2X/week      PT Plan Current plan remains appropriate       AM-PAC PT "6 Clicks" Mobility   Outcome Measure  Help needed turning from your back to your side while in a flat bed without using bedrails?: None Help needed moving from lying on your back to sitting on the side of a flat bed without using bedrails?: None Help needed moving to and from a bed to a chair (including a wheelchair)?: A Little Help needed standing up from a chair using your arms (e.g., wheelchair or bedside chair)?: A Little Help needed to walk in hospital room?: A Little Help needed climbing 3-5 steps with a railing? : A Lot 6 Click Score: 19    End of Session Equipment Utilized During Treatment: Oxygen Activity Tolerance: Treatment limited secondary to medical complications (Comment) (O2 desaturation with mobility) Patient left: with call bell/phone within reach;with chair alarm set;in chair;Other (comment) (RT in room) Nurse Communication: Mobility status PT Visit Diagnosis: Unsteadiness on feet (R26.81);Muscle weakness (generalized) (M62.81);Difficulty in walking, not elsewhere classified (R26.2);Pain Pain - Right/Left:  (bilateral) Pain - part of body:  (back and ribs)     Time: 5784-6962 PT Time Calculation (min) (ACUTE ONLY): 26  min  Charges:  $Therapeutic Activity: 23-37 mins                     Bon Dowis B. Migdalia Dk PT, DPT Acute Rehabilitation Services Please use secure chat or  Call Office [phone]    [place] 10/24/2021, 9:03 AM

## 2021-10-24 NOTE — Progress Notes (Signed)
PROGRESS NOTE    Wayne Meadows  EPP:[id number] DOB: 03/26/1934 DOA: 10/21/2021 PCP: [name]   Brief Narrative: Wayne Meadows is a 87 y.o. male with history of paroxysmal atrial fibrillation, CAD, hypothyroidism, COPD, chronic respiratory failure with hypoxia, ESRD on HD who presented secondary to severe back pain.  CT chest obtained on admission which is negative for PE but significant for new T5 and T7 compression fractures in addition to previously known rib fractures.  Imaging was also concerning for possible aspiration pneumonia and patient was started on empiric Unasyn IV.  Patient was also started on pain medication for management of acute back pain from compression fractures.   Assessment and Plan:  Aspiration pneumonia Noted on imaging.  Patient without significant clinical symptoms.  Patient started empirically on Unasyn.  Associated mild leukocytosis. -Continue Unasyn  Acute on chronic respiratory failure with hypoxia Secondary to aspiration pneumonia.  Patient requiring up to 4 L/min of oxygen with associated mild tachypnea on admission. Oxygen requirement increased to 6 L/min. Repeat chest x-ray obtained and shows new right lower lobe area of consolidation vs effusion. -Wean oxygen back to chronic 2.5 L/min as able -Continuous pulse ox -Right lateral decubitus chest x-ray to rule in/out effusion  ESRD on HD Nephrology consulted on admission and are managing hemodialysis while inpatient.  Hyperkalemia Management with hemodialysis  Vertebral compression fractures (T5 and T7) Bilateral rib fractures Patient started on port of care with analgesic regimen.  Patient initially started on Dilaudid IV for management. -Continue Norco as needed and continue Dilaudid IV for breakthrough  Paroxysmal atrial fibrillation Patient is in normal sinus rhythm.  Patient is prescribed diltiazem and metoprolol for management as an outpatient, however he is listed as  not taking diltiazem.  Patient is not on anticoagulation as an outpatient. -Continue diltiazem and metoprolol  Hyperbilirubinemia Mild mostly unconjugated/indirect bilirubin.  Patient with known anemia which appears to be stable. Resolved this morning. LDH low. Haptoglobin pending.  CAD No chest pain. -Continue aspirin and pravastatin  Hypothyroidism -Continue Synthroid  COPD Initially asymptomatic but symptoms may be worsening secondary to aspiration pneumonia. Worsening wheezing today. Continued hypoxia. Productive cough. -Scheduled Duonebs -Continue treatment for pneumonia -Flutter valve -Will consider steroids if he does not respond well to nebulizer treatments   DVT prophylaxis: Heparin subcu Code Status:   Code Status: Full Code Family Communication: None at bedside Disposition Plan: Discharge back to SNF once pain is better controlled   Consultants:  Nephrology  Procedures:  None  Antimicrobials: Unasyn IV    Subjective: Patient reports some improvement of dyspnea from this morning. Continued rib and back pain. He continues to have some clear/cloudy phlegm production with cough.  Objective: BP (!) 151/57 (BP Location: Right Arm)   Pulse (!) 59   Temp 98.2 F (36.8 C) (Oral)   Resp (!) 9   Ht '6\' 2"'$  (1.88 m)   Wt 76.3 kg   SpO2 97%   BMI 21.60 kg/m   Examination:  General exam: Appears calm and comfortable Respiratory system: Expiratory rhonchi. Respiratory effort normal. Cardiovascular system: S1 & S2 heard, RRR. No murmurs, rubs, gallops or clicks. Gastrointestinal system: Abdomen is nondistended, soft and nontender. No organomegaly or masses felt. Normal bowel sounds heard. Central nervous system: Alert and oriented. No focal neurological deficits. Musculoskeletal: No edema. No calf tenderness Skin: No cyanosis. No rashes Psychiatry: Judgement and insight appear normal. Mood & affect appropriate.    Data Reviewed: I have personally reviewed  following labs  and imaging studies  CBC Lab Results  Component Value Date   WBC 7.6 10/24/2021   RBC 3.13 (L) 10/24/2021   HGB 9.4 (L) 10/24/2021   HCT 29.5 (L) 10/24/2021   MCV 94.2 10/24/2021   MCH 30.0 10/24/2021   PLT 255 10/24/2021   MCHC 31.9 10/24/2021   RDW 17.3 (H) 10/24/2021   LYMPHSABS 0.9 10/21/2021   MONOABS 0.9 10/21/2021   EOSABS 0.3 10/21/2021   BASOSABS 0.0 30/86/5784     Last metabolic panel Lab Results  Component Value Date   NA 139 10/24/2021   K 4.3 10/24/2021   CL 97 (L) 10/24/2021   CO2 27 10/24/2021   BUN 36 (H) 10/24/2021   CREATININE 6.07 (H) 10/24/2021   GLUCOSE 96 10/24/2021   GFRNONAA 9 (L) 10/24/2021   GFRAA 52 (L) 07/31/2017   CALCIUM 8.6 (L) 10/24/2021   PHOS 5.9 (H) 10/24/2021   PROT 5.9 (L) 10/22/2021   ALBUMIN 2.4 (L) 10/24/2021   BILITOT 0.4 10/23/2021   ALKPHOS 158 (H) 10/22/2021   AST 14 (L) 10/22/2021   ALT 13 10/22/2021   ANIONGAP 15 10/24/2021    GFR: Estimated Creatinine Clearance: 9.8 mL/min (A) (by C-G formula based on SCr of 6.07 mg/dL (H)).  Recent Results (from the past 240 hour(s))  SARS Coronavirus 2 by RT PCR (hospital order, performed in [facility] hospital lab) *cepheid single result test* Anterior Nasal Swab     Status: None   Collection Time: 10/21/21  3:54 PM   Specimen: Anterior Nasal Swab  Result Value Ref Range Status   SARS Coronavirus 2 by RT PCR NEGATIVE NEGATIVE Final    Comment: (NOTE) SARS-CoV-2 target nucleic acids are NOT DETECTED.  The SARS-CoV-2 RNA is generally detectable in upper and lower respiratory specimens during the acute phase of infection. The lowest concentration of SARS-CoV-2 viral copies this assay can detect is 250 copies / mL. A negative result does not preclude SARS-CoV-2 infection and should not be used as the sole basis for treatment or other patient management decisions.  A negative result may occur with improper specimen collection / handling, submission of specimen  other than nasopharyngeal swab, presence of viral mutation(s) within the areas targeted by this assay, and inadequate number of viral copies (<250 copies / mL). A negative result must be combined with clinical observations, patient history, and epidemiological information.  Fact Sheet for Patients:   https://www.patel.info/  Fact Sheet for Healthcare Providers: https://hall.com/  This test is not yet approved or  cleared by the [place] FDA and has been authorized for detection and/or diagnosis of SARS-CoV-2 by FDA under an Emergency Use Authorization (EUA).  This EUA will remain in effect (meaning this test can be used) for the duration of the COVID-19 declaration under Section 564(b)(1) of the Act, 21 U.S.C. section 360bbb-3(b)(1), unless the authorization is terminated or revoked sooner.  Performed at [facility], [place] [address], [place], [place] [address]   Expectorated Sputum Assessment w Gram Stain, Rflx to Resp Cult     Status: None   Collection Time: 10/22/21  1:56 AM   Specimen: Expectorated Sputum  Result Value Ref Range Status   Specimen Description EXPECTORATED SPUTUM  Final   Special Requests NONE  Final   Sputum evaluation   Final    Sputum specimen not acceptable for testing.  Please recollect.   NOTIFIED MANUEL CASTRO ON 10/23/21 @ 2019 BY DRT Performed at [facility], [place],  [place] [address]    Report Status 10/23/2021 FINAL  Final  MRSA Next Gen by PCR, Nasal     Status: Abnormal   Collection Time: 10/22/21  2:05 AM   Specimen: Nasal Mucosa; Nasal Swab  Result Value Ref Range Status   MRSA by PCR Next Gen DETECTED (A) NOT DETECTED Final    Comment: CRITICAL RESULT CALLED TO, READ BACK BY AND VERIFIED WITH:  Prescott Gum, RN 10/22/21 0545 A. LAFRANCE (NOTE) The GeneXpert MRSA Assay (FDA approved for NASAL specimens only), is one component of a comprehensive MRSA colonization  surveillance program. It is not intended to diagnose MRSA infection nor to guide or monitor treatment for MRSA infections. Test performance is not FDA approved in patients less than 61 years old. Performed at [facility], [place] [address] [place], [place] [address]   Blood culture (routine x 2)     Status: None (Preliminary result)   Collection Time: 10/22/21  3:13 AM   Specimen: BLOOD LEFT HAND  Result Value Ref Range Status   Specimen Description BLOOD LEFT HAND  Final   Special Requests   Final    BOTTLES DRAWN AEROBIC AND ANAEROBIC Blood Culture adequate volume   Culture   Final    NO GROWTH 2 DAYS Performed at [facility], [place] [address], [place], [place] [address]    Report Status PENDING  Incomplete  Blood culture (routine x 2)     Status: None (Preliminary result)   Collection Time: 10/22/21  3:18 AM   Specimen: BLOOD LEFT ARM  Result Value Ref Range Status   Specimen Description BLOOD LEFT ARM  Final   Special Requests   Final    BOTTLES DRAWN AEROBIC AND ANAEROBIC Blood Culture adequate volume   Culture   Final    NO GROWTH 2 DAYS Performed at [facility], [place] [address], [place],  [address]    Report Status PENDING  Incomplete      Radiology Studies: No results found.    LOS: 3 days    Cordelia Poche, MD [facility] 10/24/2021, 10:32 AM   If 7PM-7AM, please contact night-coverage [email or web address]

## 2021-10-24 NOTE — TOC Progression Note (Signed)
Transition of Care [facility]) - Initial/Assessment Note    Patient Details  Name: Wayne Meadows MRN: [id number] Date of Birth: 1934-07-28  [facility] CM/SW Contact:    Milinda Antis, [place] Phone Number: 10/24/2021, 3:50 PM  Clinical Narrative:                 CSW contacted Janie in admissions at [facility] and verified that the patient is LTC at the facility and can return when medically ready.          Patient Goals and CMS Choice        Expected Discharge Plan and Services           Expected Discharge Date: 10/27/21                                    Prior Living Arrangements/Services                       Activities of Daily Living Home Assistive Devices/Equipment: None ADL Screening (condition at time of admission) Patient's cognitive ability adequate to safely complete daily activities?: Yes Is the patient deaf or have difficulty hearing?: No Does the patient have difficulty seeing, even when wearing glasses/contacts?: No Does the patient have difficulty concentrating, remembering, or making decisions?: No Patient able to express need for assistance with ADLs?: Yes Does the patient have difficulty dressing or bathing?: No Independently performs ADLs?: Yes (appropriate for developmental age) Does the patient have difficulty walking or climbing stairs?: Yes Weakness of Legs: Both Weakness of Arms/Hands: None  Permission Sought/Granted                  Emotional Assessment              Admission diagnosis:  Pleuritic chest pain [R07.81] Aspiration pneumonia ([place]) [J69.0] Pneumonia due to infectious organism, unspecified laterality, unspecified part of lung [J18.9] Back pain, unspecified back location, unspecified back pain laterality, unspecified chronicity [M54.9] Patient Active Problem List   Diagnosis Date Noted   Back pain    Aspiration pneumonia ([place]) 10/21/2021   Atrial fibrillation ([place]) 09/29/2021    Chronic respiratory failure with hypoxia ([place]) 09/28/2021   Paroxysmal atrial flutter ([place]) 09/28/2021   Hyperkalemia 09/28/2021   ESRD (end stage renal disease) ([place]) 09/11/2021   Partial small bowel obstruction ([place]) 08/17/2021   Right lower lobe pneumonia 08/13/2021   Acute on chronic respiratory failure with hypoxia ([place]) 08/13/2021   Arteriovenous fistula infection ([place]) 04/18/2021   Gastric ulcer without hemorrhage or perforation    Duodenal ulcer    Elevated INR    Acute upper GI bleed 04/06/2021   ESRD (end stage renal disease) on MWF dialysis ([place]) 04/06/2021   Supratherapeutic INR 04/06/2021   Proteinuria 03/07/2021   AKI (acute kidney injury) ([place]) 03/05/2021   Hypothyroidism 03/05/2021   Anxiety 03/05/2021   Recurrent cellulitis of lower leg 03/04/2021   Chronic pancreatitis ([place]) 12/24/2020   Nausea and vomiting in adult 06/22/2020   Esophageal stricture    Hyperbilirubinemia 04/11/2016   Elevated lactic acid level    Segmental colitis ([place]) 09/04/2015   Noninfectious gastroenteritis, unspecified    Benign neoplasm of transverse colon    Benign neoplasm of colon    Bright red blood per rectum    Dysphagia    Renal cell cancer ([place])    Hypertension    Centrilobular emphysema ([place])  Blood in stool 08/19/2015   Rectal bleeding 08/19/2015   CAP (community acquired pneumonia) 04/08/2014   Overweight (BMI 25.0-29.9) 04/08/2014   Healthcare-associated pneumonia 10/26/2011   Cholelithiasis 04/26/2011   Paroxysmal atrial fibrillation ([place]) 04/26/2011   Chest pain 03/19/2011   Unstable angina ([place]) 03/18/2011    Class: Acute   Lung nodule 03/02/2011   Liver lesion 03/02/2011   Other hyperlipidemia 03/02/2011   Abnormal CT of the abdomen 02/23/2011   Non Q wave myocardial infarction ([place]) 02/22/2011    Class: Acute   History of CVA (cerebrovascular accident) 02/22/2011   CAD (coronary artery disease) 02/22/2011   COPD (chronic obstructive pulmonary disease)  ([place]) 02/22/2011   Tobacco abuse, 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/22/2011   PCP:  [facility] [place]:   [id number] [place] [address]Phone: [phone]: [phone]    Social Determinants of Health (SDOH) Interventions    Readmission Risk Interventions    09/30/2021    2:03 PM 03/21/2021    1:21 PM 03/19/2021    1:23 PM  Readmission Risk Prevention Plan  Transportation Screening Complete Complete   Medication Review ([place] Complete    PCP or Specialist appointment within 3-5 days of discharge Complete    HRI or Home Care Consult Complete Complete   SW Recovery Care/Counseling Consult Complete Complete Complete  Palliative Care Screening Not Applicable    Skilled Nursing Facility Complete Complete Complete

## 2021-10-24 NOTE — Progress Notes (Signed)
6/10 pain expressed by patient. PRN medication given. Also repositioned patient and placed pillow on left side for support.

## 2021-10-24 NOTE — Progress Notes (Addendum)
[facility] Progress Note   Subjective: Seen in room, up in chair. Feels miserable. Says he doesn't have fluid on, that the sounds are coming from his lung disease which I agree. HD later today on schedule.     Objective Vitals:   10/24/21 0930 10/24/21 1000 10/24/21 1120 10/24/21 1122  BP: (!) 151/57     Pulse: (!) 59     Resp: (!) 9     Temp: 98.2 F (36.8 C)     TempSrc: Oral     SpO2: 97% 92% 94% 92%  Weight:      Height:       Physical Exam General: Chronically ill very elderly male in NAD Heart: S1 S2 RRR No M/R/G Lungs: Few scattered rhonchi upper airway, decreased in bases. Using O2 via [place]. No WOB.  Abdomen: NABS, NT Extremities:No LE edema Dialysis Access: R AVF + T/B   Additional Objective Labs: Basic Metabolic Panel: Recent Labs  Lab 10/22/21 0252 10/23/21 0451 10/24/21 0734  NA 135 138 139  K 4.0 4.4 4.3  CL 97* 98 97*  CO2 '23 31 27  '$ GLUCOSE 74 114* 96  BUN 33* 23 36*  CREATININE 4.61* 4.11* 6.07*  CALCIUM 9.0 8.4* 8.6*  PHOS  --   --  5.9*   Liver Function Tests: Recent Labs  Lab 10/21/21 1554 10/22/21 0252 10/23/21 0451 10/24/21 0734  AST 16 14*  --   --   ALT 13 13  --   --   ALKPHOS 185* 158*  --   --   BILITOT 1.3* 1.5* 0.4  --   PROT 6.5 5.9*  --   --   ALBUMIN 3.0* 2.8*  --  2.4*   Recent Labs  Lab 10/21/21 1554  LIPASE 23   CBC: Recent Labs  Lab 10/21/21 1554 10/22/21 0252 10/23/21 0451 10/24/21 0734  WBC 10.8* 10.6* 7.3 7.6  NEUTROABS 8.6*  --   --   --   HGB 10.2* 9.7* 9.3* 9.4*  HCT 31.2* 29.9* 28.5* 29.5*  MCV 92.0 92.0 92.5 94.2  PLT 316 321 284 255   Blood Culture    Component Value Date/Time   SDES BLOOD LEFT ARM 10/22/2021 0318   SPECREQUEST  10/22/2021 0318    BOTTLES DRAWN AEROBIC AND ANAEROBIC Blood Culture adequate volume   CULT  10/22/2021 0318    NO GROWTH 2 DAYS Performed at [facility], [place] [address] [place], [place] [address]    REPTSTATUS PENDING 10/22/2021 [phone]     Cardiac Enzymes: No results for input(s): "CKTOTAL", "CKMB", "CKMBINDEX", "TROPONINI" in the last 168 hours. CBG: No results for input(s): "GLUCAP" in the last 168 hours. Iron Studies: No results for input(s): "IRON", "TIBC", "TRANSFERRIN", "FERRITIN" in the last 72 hours. '@lablastinr3'$ @ Studies/Results: DG CHEST PORT 1 VIEW  Result Date: 10/24/2021 CLINICAL DATA:  Shortness of breath EXAM: PORTABLE CHEST 1 VIEW COMPARISON:  Chest radiograph August 29 23 FINDINGS: Stable cardiac and mediastinal contours. Aortic athero sclerosis. Interval development of opacification within the right lower hemithorax which may represent combination of pleural fluid, lobar atelectasis and underlying pulmonary parenchymal consolidation. Small left pleural effusion with underlying consolidation. No pneumothorax. Lumbar spinal fusion hardware. IMPRESSION: New large area of consolidation right lower hemithorax may represent combination of lobar atelectasis and pleural fluid. Recommend short-term follow-up chest radiograph. Small left effusion and underlying opacities. Electronically Signed   By: Lovey Newcomer M.D.   On: 10/24/2021 10:59   Medications:  ampicillin-sulbactam (UNASYN)  IV 1.5 g (10/24/21 0910)    albuterol  2.5 mg Nebulization QID   aspirin EC  81 mg Oral Daily   busPIRone  5 mg Oral BID   Chlorhexidine Gluconate Cloth  6 each Topical Q0600   Chlorhexidine Gluconate Cloth  6 each Topical Q0600   Chlorhexidine Gluconate Cloth  6 each Topical Q0600   diltiazem  240 mg Oral Daily   guaiFENesin  1,200 mg Oral BID   heparin  5,000 Units Subcutaneous Q8H   levothyroxine  50 mcg Oral QAC breakfast   lidocaine  1 patch Transdermal Daily   metoprolol tartrate  12.5 mg Oral BID   mupirocin ointment  1 Application Nasal BID   naphazoline-glycerin  1-2 drop Right Eye TID   neomycin-bacitracin-polymyxin  1 Application Topical TID   mouth rinse  15 mL Mouth Rinse 4 times per day   pravastatin  40 mg Oral QHS    senna  2 tablet Oral QHS   sodium chloride flush  3 mL Intravenous Q12H   tamsulosin  0.4 mg Oral Daily     Outpatient Dialysis Orders: MWF [facility]  4h  300/500  77 kg  3K/2.5Ca bath  Heparin 3000  RUE AVF - mircera 75 mcg IVP q2 weeks- last dose 10/15/21 - iron sucrose '50mg'$  q wk   Assessment/Plan:  Back and abdominal pain - associated with SOB.  CT consistent with pneumonia. On IV abx. Unfortunately HD did not help symptoms.   Plan per primary.  Lower extremity duplex without evidence of DVT.  Pain management per primary but avoid morphine and baclofen. Preferred narcotic agents for pain control are hydromorphone, fentanyl, and methadone. Morphine should not be used.  Avoid Baclofen and avoid oral sodium phosphate and magnesium citrate based laxatives / bowel preps.   ESRD -  missed HD Monday, had HD Tuesday and Wednesday. Next HD Friday.   Hypertension/volume  - bp stable, edema improved and now below his previous EDW. Does not tolerate very high UF goals.   Anemia  - Hgb 9.3. ESA recently given outpatient. No IV Fe with acute infection.   Metabolic bone disease -  Corrected calcium elevated but improving. No VDRA. Will check phos with next labs. continue with home meds  Nutrition - on a regular diet, follow K+ and phos trends    Rita H. Brown NP-C 10/24/2021, 12:06 PM  [facility] [phone]  I have seen and examined this patient and agree with the plan of care.  Looks awful and miserable; going for HD today. Feels SOB but I'm not convinced it's all from fluid.  Dwana Melena, MD 10/24/2021, 1:10 PM

## 2021-10-25 DIAGNOSIS — J9621 Acute and chronic respiratory failure with hypoxia: Secondary | ICD-10-CM | POA: Diagnosis not present

## 2021-10-25 DIAGNOSIS — J449 Chronic obstructive pulmonary disease, unspecified: Secondary | ICD-10-CM | POA: Diagnosis not present

## 2021-10-25 DIAGNOSIS — N186 End stage renal disease: Secondary | ICD-10-CM | POA: Diagnosis not present

## 2021-10-25 DIAGNOSIS — J690 Pneumonitis due to inhalation of food and vomit: Secondary | ICD-10-CM | POA: Diagnosis not present

## 2021-10-25 LAB — CBC WITH DIFFERENTIAL/PLATELET
Abs Immature Granulocytes: 0.03 10*3/uL (ref 0.00–0.07)
Basophils Absolute: 0.1 10*3/uL (ref 0.0–0.1)
Basophils Relative: 1 %
Eosinophils Absolute: 0.1 10*3/uL (ref 0.0–0.5)
Eosinophils Relative: 2 %
HCT: 29.2 % — ABNORMAL LOW (ref 39.0–52.0)
Hemoglobin: 9.5 g/dL — ABNORMAL LOW (ref 13.0–17.0)
Immature Granulocytes: 1 %
Lymphocytes Relative: 14 %
Lymphs Abs: 0.8 10*3/uL (ref 0.7–4.0)
MCH: 30.3 pg (ref 26.0–34.0)
MCHC: 32.5 g/dL (ref 30.0–36.0)
MCV: 93 fL (ref 80.0–100.0)
Monocytes Absolute: 1 10*3/uL (ref 0.1–1.0)
Monocytes Relative: 17 %
Neutro Abs: 3.9 10*3/uL (ref 1.7–7.7)
Neutrophils Relative %: 65 %
Platelets: 230 10*3/uL (ref 150–400)
RBC: 3.14 MIL/uL — ABNORMAL LOW (ref 4.22–5.81)
RDW: 17.2 % — ABNORMAL HIGH (ref 11.5–15.5)
WBC: 5.9 10*3/uL (ref 4.0–10.5)
nRBC: 0 % (ref 0.0–0.2)

## 2021-10-25 MED ORDER — VANCOMYCIN HCL 1500 MG/300ML IV SOLN
1500.0000 mg | Freq: Once | INTRAVENOUS | Status: AC
  Administered 2021-10-25: 1500 mg via INTRAVENOUS
  Filled 2021-10-25: qty 300

## 2021-10-25 MED ORDER — PROSOURCE PLUS PO LIQD
30.0000 mL | Freq: Two times a day (BID) | ORAL | Status: DC
  Administered 2021-10-25 – 2021-10-30 (×8): 30 mL via ORAL
  Filled 2021-10-25 (×8): qty 30

## 2021-10-25 MED ORDER — VANCOMYCIN HCL 750 MG/150ML IV SOLN
750.0000 mg | INTRAVENOUS | Status: DC
  Administered 2021-10-27: 750 mg via INTRAVENOUS
  Filled 2021-10-25 (×2): qty 150

## 2021-10-25 NOTE — Plan of Care (Signed)
  Problem: Clinical Measurements: Goal: Ability to maintain a body temperature in the normal range will improve Outcome: Progressing   Problem: Respiratory: Goal: Ability to maintain adequate ventilation will improve Outcome: Progressing   Problem: Respiratory: Goal: Ability to maintain a clear airway will improve Outcome: Progressing   Problem: Activity: Goal: Ability to tolerate increased activity will improve Outcome: Progressing   Problem: Clinical Measurements: Goal: Ability to maintain a body temperature in the normal range will improve Outcome: Progressing

## 2021-10-25 NOTE — Progress Notes (Signed)
[facility] Progress Note   Subjective: Seen in room. Breathing much better today. No C/Os.   Objective Vitals:   10/25/21 0611 10/25/21 0841 10/25/21 0842 10/25/21 0900  BP: (!) 154/56   (!) 148/52  Pulse: 89 90  93  Resp: '20 18  16  '$ Temp: 98.7 F (37.1 C)   99.1 F (37.3 C)  TempSrc: Oral   Oral  SpO2: 92% 92% 92% (!) 88%  Weight: 74.7 kg     Height:       Physical Exam General: Chronically ill very elderly male in NAD Heart: S1 S2 RRR No M/R/G Lungs: Clear upper lung fields, decreased in bases. Using O2 via [place]. No WOB.  Abdomen: NABS, NT Extremities:No LE edema Dialysis Access: R AVF + T/B  Additional Objective Labs: Basic Metabolic Panel: Recent Labs  Lab 10/22/21 0252 10/23/21 0451 10/24/21 0734  NA 135 138 139  K 4.0 4.4 4.3  CL 97* 98 97*  CO2 '23 31 27  '$ GLUCOSE 74 114* 96  BUN 33* 23 36*  CREATININE 4.61* 4.11* 6.07*  CALCIUM 9.0 8.4* 8.6*  PHOS  --   --  5.9*   Liver Function Tests: Recent Labs  Lab 10/21/21 1554 10/22/21 0252 10/23/21 0451 10/24/21 0734  AST 16 14*  --   --   ALT 13 13  --   --   ALKPHOS 185* 158*  --   --   BILITOT 1.3* 1.5* 0.4  --   PROT 6.5 5.9*  --   --   ALBUMIN 3.0* 2.8*  --  2.4*   Recent Labs  Lab 10/21/21 1554  LIPASE 23   CBC: Recent Labs  Lab 10/21/21 1554 10/22/21 0252 10/23/21 0451 10/24/21 0734 10/25/21 0948  WBC 10.8* 10.6* 7.3 7.6 5.9  NEUTROABS 8.6*  --   --   --  3.9  HGB 10.2* 9.7* 9.3* 9.4* 9.5*  HCT 31.2* 29.9* 28.5* 29.5* 29.2*  MCV 92.0 92.0 92.5 94.2 93.0  PLT 316 321 284 255 230   Blood Culture    Component Value Date/Time   SDES BLOOD LEFT ARM 10/22/2021 0318   SPECREQUEST  10/22/2021 0318    BOTTLES DRAWN AEROBIC AND ANAEROBIC Blood Culture adequate volume   CULT  10/22/2021 0318    NO GROWTH 3 DAYS Performed at [facility], [place] [address], [place], [place] [address]    REPTSTATUS PENDING 10/22/2021 [phone]    Cardiac Enzymes: No results for input(s):  "CKTOTAL", "CKMB", "CKMBINDEX", "TROPONINI" in the last 168 hours. CBG: No results for input(s): "GLUCAP" in the last 168 hours. Iron Studies: No results for input(s): "IRON", "TIBC", "TRANSFERRIN", "FERRITIN" in the last 72 hours. '@lablastinr3'$ @ Studies/Results: DG Chest Right Decubitus  Result Date: 10/24/2021 CLINICAL DATA:  Shortness of breath, chest pain. EXAM: CHEST - RIGHT DECUBITUS COMPARISON:  Same day. FINDINGS: Right decubitus view demonstrates small free-flowing right pleural effusion. IMPRESSION: Small free flowing right pleural effusion is noted. Electronically Signed   By: Marijo Conception M.D.   On: 10/24/2021 16:11   DG CHEST PORT 1 VIEW  Result Date: 10/24/2021 CLINICAL DATA:  Shortness of breath EXAM: PORTABLE CHEST 1 VIEW COMPARISON:  Chest radiograph August 29 23 FINDINGS: Stable cardiac and mediastinal contours. Aortic athero sclerosis. Interval development of opacification within the right lower hemithorax which may represent combination of pleural fluid, lobar atelectasis and underlying pulmonary parenchymal consolidation. Small left pleural effusion with underlying consolidation. No pneumothorax. Lumbar spinal fusion hardware. IMPRESSION: New  large area of consolidation right lower hemithorax may represent combination of lobar atelectasis and pleural fluid. Recommend short-term follow-up chest radiograph. Small left effusion and underlying opacities. Electronically Signed   By: Lovey Newcomer M.D.   On: 10/24/2021 10:59   Medications:  ampicillin-sulbactam (UNASYN) IV 1.5 g (10/25/21 0910)   vancomycin     [START ON 10/27/2021] vancomycin      albuterol  2.5 mg Nebulization QID   aspirin EC  81 mg Oral Daily   busPIRone  5 mg Oral BID   Chlorhexidine Gluconate Cloth  6 each Topical Q0600   diltiazem  240 mg Oral Daily   guaiFENesin  1,200 mg Oral BID   heparin  5,000 Units Subcutaneous Q8H   levothyroxine  50 mcg Oral QAC breakfast   lidocaine  1 patch Transdermal Daily    metoprolol tartrate  12.5 mg Oral BID   mupirocin ointment  1 Application Nasal BID   naphazoline-glycerin  1-2 drop Right Eye TID   neomycin-bacitracin-polymyxin  1 Application Topical TID   mouth rinse  15 mL Mouth Rinse 4 times per day   pravastatin  40 mg Oral QHS   senna  2 tablet Oral QHS   sodium chloride flush  3 mL Intravenous Q12H   tamsulosin  0.4 mg Oral Daily     Outpatient Dialysis Orders: MWF [facility]  4h  300/500  77 kg  3K/2.5Ca bath  Heparin 3000 units IV  RUE AVF - mircera 75 mcg IVP q2 weeks- last dose 10/15/21 - iron sucrose '50mg'$  q wk   Assessment/Plan:  Back and abdominal pain - associated with SOB.  CT consistent with pneumonia. On IV abx. Unfortunately HD did not help symptoms.   Plan per primary.  Lower extremity duplex without evidence of DVT.  Pain management per primary but avoid morphine and baclofen. Preferred narcotic agents for pain control are hydromorphone, fentanyl, and methadone. Morphine should not be used.  Avoid Baclofen and avoid oral sodium phosphate and magnesium citrate based laxatives / bowel preps.   ESRD -  missed HD Monday, had HD Tuesday and Wednesday. Next HD 10/27/2021.   Hypertension/volume  - bp stable, edema improved and now below his previous EDW. Does not tolerate very high UF goals.   Anemia  - Hgb 9.5. ESA recently given outpatient. No IV Fe with acute infection.   Metabolic bone disease -  Corrected calcium elevated but improving. No VDRA. Will check phos with next labs. continue with home meds  Nutrition - Low albumin. Add protein supplements. on a regular diet, follow K+ and phos trends  Wayne Thiem H. Luevenia Mcavoy NP-C 10/25/2021, 11:12 AM  [employer] [phone]

## 2021-10-25 NOTE — Progress Notes (Signed)
PROGRESS NOTE    Wayne Meadows  IRS:[id number] DOB: Jul 17, 1934 DOA: 10/21/2021 PCP: [name]   Brief Narrative: Wayne Meadows is a 87 y.o. male with history of paroxysmal atrial fibrillation, CAD, hypothyroidism, COPD, chronic respiratory failure with hypoxia, ESRD on HD who presented secondary to severe back pain.  CT chest obtained on admission which is negative for PE but significant for new T5 and T7 compression fractures in addition to previously known rib fractures.  Imaging was also concerning for possible aspiration pneumonia and patient was started on empiric Unasyn IV.  Patient was also started on pain medication for management of acute back pain from compression fractures.   Assessment and Plan:  Aspiration pneumonia Noted on imaging.  Patient without significant clinical symptoms.  Patient started empirically on Unasyn.  Associated mild leukocytosis which has resolved. Patient with fever overnight and prior MRSA positive test. Recent chest imaging was inconclusive for fluid vs worsening consolidation -Broaden to Vancomycin to cover possible MRSA infection -Continue Unasyn -Repeat chest x-ray in AM  Acute on chronic respiratory failure with hypoxia Secondary to aspiration pneumonia.  Patient requiring up to 4 L/min of oxygen with associated mild tachypnea on admission. Oxygen requirement increased to 6 L/min. Repeat chest x-ray obtained and shows new right lower lobe area of consolidation vs effusion. Right lateral decubitus chest x-ray suggests effusion. Oxygen weaned down after second hemodialysis session. -Wean oxygen back to chronic 2.5 L/min as able -Continuous pulse ox  ESRD on HD Nephrology consulted on admission and are managing hemodialysis while inpatient.  Hyperkalemia Management with hemodialysis  Vertebral compression fractures (T5 and T7) Bilateral rib fractures Patient started on port of care with analgesic regimen.  Patient initially  started on Dilaudid IV for management. -Continue Norco as needed and continue Dilaudid IV for breakthrough  Paroxysmal atrial fibrillation Patient is in normal sinus rhythm.  Patient is prescribed diltiazem and metoprolol for management as an outpatient, however he is listed as not taking diltiazem.  Patient is not on anticoagulation as an outpatient. -Continue diltiazem and metoprolol  Hyperbilirubinemia Mild mostly unconjugated/indirect bilirubin.  Patient with known anemia which appears to be stable. Resolved. LDH low. Haptoglobin pending.  CAD No chest pain. -Continue aspirin and pravastatin  Hypothyroidism -Continue Synthroid  COPD Initially asymptomatic but symptoms may be worsening secondary to aspiration pneumonia. Worsening wheezing today. Continued hypoxia. Productive cough. Improving. -Scheduled Duonebs -Continue treatment for pneumonia -Flutter valve   DVT prophylaxis: Heparin subcu Code Status:   Code Status: Full Code Family Communication: None at bedside Disposition Plan: Discharge back to SNF once pain is better controlled   Consultants:  Nephrology  Procedures:  None  Antimicrobials: Unasyn IV    Subjective: Patient reports feeling better today. No issues overnight. Breathing well.  Objective: BP (!) 148/52 (BP Location: Left Arm)   Pulse 85   Temp 99.1 F (37.3 C) (Oral)   Resp (!) 22   Ht '6\' 2"'$  (1.88 m)   Wt 74.7 kg   SpO2 90%   BMI 21.14 kg/m   Examination:  General exam: Appears calm and comfortable Respiratory system: Diminished but no wheezing or rales heard. Respiratory effort normal. Cardiovascular system: S1 & S2 heard, RRR. No murmurs, rubs, gallops or clicks. Gastrointestinal system: Abdomen is distended, soft and non-tender. Normal bowel sounds heard. Central nervous system: Alert and oriented. No focal neurological deficits. Musculoskeletal: No edema. No calf tenderness Skin: No cyanosis. No rashes Psychiatry: Judgement and  insight appear normal. Mood & affect  appropriate.    Data Reviewed: I have personally reviewed following labs and imaging studies  CBC Lab Results  Component Value Date   WBC 5.9 10/25/2021   RBC 3.14 (L) 10/25/2021   HGB 9.5 (L) 10/25/2021   HCT 29.2 (L) 10/25/2021   MCV 93.0 10/25/2021   MCH 30.3 10/25/2021   PLT 230 10/25/2021   MCHC 32.5 10/25/2021   RDW 17.2 (H) 10/25/2021   LYMPHSABS 0.8 10/25/2021   MONOABS 1.0 10/25/2021   EOSABS 0.1 10/25/2021   BASOSABS 0.1 95/63/8756     Last metabolic panel Lab Results  Component Value Date   NA 139 10/24/2021   K 4.3 10/24/2021   CL 97 (L) 10/24/2021   CO2 27 10/24/2021   BUN 36 (H) 10/24/2021   CREATININE 6.07 (H) 10/24/2021   GLUCOSE 96 10/24/2021   GFRNONAA 9 (L) 10/24/2021   GFRAA 52 (L) 07/31/2017   CALCIUM 8.6 (L) 10/24/2021   PHOS 5.9 (H) 10/24/2021   PROT 5.9 (L) 10/22/2021   ALBUMIN 2.4 (L) 10/24/2021   BILITOT 0.4 10/23/2021   ALKPHOS 158 (H) 10/22/2021   AST 14 (L) 10/22/2021   ALT 13 10/22/2021   ANIONGAP 15 10/24/2021    GFR: Estimated Creatinine Clearance: 9.6 mL/min (A) (by C-G formula based on SCr of 6.07 mg/dL (H)).  Recent Results (from the past 240 hour(s))  SARS Coronavirus 2 by RT PCR (hospital order, performed in [facility] hospital lab) *cepheid single result test* Anterior Nasal Swab     Status: None   Collection Time: 10/21/21  3:54 PM   Specimen: Anterior Nasal Swab  Result Value Ref Range Status   SARS Coronavirus 2 by RT PCR NEGATIVE NEGATIVE Final    Comment: (NOTE) SARS-CoV-2 target nucleic acids are NOT DETECTED.  The SARS-CoV-2 RNA is generally detectable in upper and lower respiratory specimens during the acute phase of infection. The lowest concentration of SARS-CoV-2 viral copies this assay can detect is 250 copies / mL. A negative result does not preclude SARS-CoV-2 infection and should not be used as the sole basis for treatment or other patient management decisions.  A  negative result may occur with improper specimen collection / handling, submission of specimen other than nasopharyngeal swab, presence of viral mutation(s) within the areas targeted by this assay, and inadequate number of viral copies (<250 copies / mL). A negative result must be combined with clinical observations, patient history, and epidemiological information.  Fact Sheet for Patients:   https://www.patel.info/  Fact Sheet for Healthcare Providers: https://hall.com/  This test is not yet approved or  cleared by the [place] FDA and has been authorized for detection and/or diagnosis of SARS-CoV-2 by FDA under an Emergency Use Authorization (EUA).  This EUA will remain in effect (meaning this test can be used) for the duration of the COVID-19 declaration under Section 564(b)(1) of the Act, 21 U.S.C. section 360bbb-3(b)(1), unless the authorization is terminated or revoked sooner.  Performed at [facility], [place] [address], [place], [place] [address]   Expectorated Sputum Assessment w Gram Stain, Rflx to Resp Cult     Status: None   Collection Time: 10/22/21  1:56 AM   Specimen: Expectorated Sputum  Result Value Ref Range Status   Specimen Description EXPECTORATED SPUTUM  Final   Special Requests NONE  Final   Sputum evaluation   Final    Sputum specimen not acceptable for testing.  Please recollect.   NOTIFIED MANUEL CASTRO ON 10/23/21 @ 2019 BY  DRT Performed at [facility], [place] [address] [place], [place] [address]    Report Status 10/23/2021 FINAL  Final  MRSA Next Gen by PCR, Nasal     Status: Abnormal   Collection Time: 10/22/21  2:05 AM   Specimen: Nasal Mucosa; Nasal Swab  Result Value Ref Range Status   MRSA by PCR Next Gen DETECTED (A) NOT DETECTED Final    Comment: CRITICAL RESULT CALLED TO, READ BACK BY AND VERIFIED WITH:  Prescott Gum, RN 10/22/21 0545 A. LAFRANCE (NOTE) The GeneXpert MRSA Assay  (FDA approved for NASAL specimens only), is one component of a comprehensive MRSA colonization surveillance program. It is not intended to diagnose MRSA infection nor to guide or monitor treatment for MRSA infections. Test performance is not FDA approved in patients less than 81 years old. Performed at [facility], [place] [address] [place], [place] [address]   Blood culture (routine x 2)     Status: None (Preliminary result)   Collection Time: 10/22/21  3:13 AM   Specimen: BLOOD LEFT HAND  Result Value Ref Range Status   Specimen Description BLOOD LEFT HAND  Final   Special Requests   Final    BOTTLES DRAWN AEROBIC AND ANAEROBIC Blood Culture adequate volume   Culture   Final    NO GROWTH 3 DAYS Performed at [facility], [place] [address], [place], [place] [address]    Report Status PENDING  Incomplete  Blood culture (routine x 2)     Status: None (Preliminary result)   Collection Time: 10/22/21  3:18 AM   Specimen: BLOOD LEFT ARM  Result Value Ref Range Status   Specimen Description BLOOD LEFT ARM  Final   Special Requests   Final    BOTTLES DRAWN AEROBIC AND ANAEROBIC Blood Culture adequate volume   Culture   Final    NO GROWTH 3 DAYS Performed at [facility], [place] [address] [place], [place] [address]    Report Status PENDING  Incomplete      Radiology Studies: DG Chest Right Decubitus  Result Date: 10/24/2021 CLINICAL DATA:  Shortness of breath, chest pain. EXAM: CHEST - RIGHT DECUBITUS COMPARISON:  Same day. FINDINGS: Right decubitus view demonstrates small free-flowing right pleural effusion. IMPRESSION: Small free flowing right pleural effusion is noted. Electronically Signed   By: Marijo Conception M.D.   On: 10/24/2021 16:11   DG CHEST PORT 1 VIEW  Result Date: 10/24/2021 CLINICAL DATA:  Shortness of breath EXAM: PORTABLE CHEST 1 VIEW COMPARISON:  Chest radiograph August 29 23 FINDINGS: Stable cardiac and mediastinal contours. Aortic athero sclerosis.  Interval development of opacification within the right lower hemithorax which may represent combination of pleural fluid, lobar atelectasis and underlying pulmonary parenchymal consolidation. Small left pleural effusion with underlying consolidation. No pneumothorax. Lumbar spinal fusion hardware. IMPRESSION: New large area of consolidation right lower hemithorax may represent combination of lobar atelectasis and pleural fluid. Recommend short-term follow-up chest radiograph. Small left effusion and underlying opacities. Electronically Signed   By: Lovey Newcomer M.D.   On: 10/24/2021 10:59      LOS: 4 days    Cordelia Poche, MD [facility] 10/25/2021, 1:17 PM   If 7PM-7AM, please contact night-coverage [email or web address]

## 2021-10-25 NOTE — Progress Notes (Signed)
Pharmacy Antibiotic Note  Wayne Meadows is a 87 y.o. male admitted on 10/21/2021 with aspiration pneumonia. Pt has ESRD on HD MWF. Pharmacy has been consulted for Unasyn/vanc dosing.  Pt is on D3 of abx. CXR showed effusion. MRSA PCR positive. D/w Dr. Lonny Prude and we will add vanc to unasyn. HD scheduled MWF.  Plan: Unasyn (ampicillin-sulbactam) 1.5g IV q12h Vanc 1.5g IV x1 then '750mg'$  MWF with HD  Height: '6\' 2"'$  (188 cm) Weight: 74.7 kg (164 lb 10.9 oz) IBW/kg (Calculated) : 82.2  Temp (24hrs), Avg:99.6 F (37.6 C), Min:98.4 F (36.9 C), Max:100.9 F (38.3 C)  Recent Labs  Lab 10/21/21 1554 10/22/21 0252 10/23/21 0451 10/24/21 0734  WBC 10.8* 10.6* 7.3 7.6  CREATININE 8.35* 4.61* 4.11* 6.07*  LATICACIDVEN  --  0.7  --   --      Estimated Creatinine Clearance: 9.6 mL/min (A) (by C-G formula based on SCr of 6.07 mg/dL (H)).    Allergies  Allergen Reactions   Bee Venom Anaphylaxis    Has epi pen   Influenza Vaccines Other (See Comments)    "Mortally sick for 2 weeks"    Antimicrobials this admission: Ceftriaxone 8/29 x1 Unasyn 8/29 >>  Vanc 9/2>>  Dose adjustments this admission: N/A  Microbiology results: 8/29 BCx: ngtd   Onnie Boer, PharmD, BCIDP, AAHIVP, CPP Infectious Disease Pharmacist 10/25/2021 10:17 AM

## 2021-10-26 ENCOUNTER — Inpatient Hospital Stay (HOSPITAL_COMMUNITY): Payer: No Typology Code available for payment source

## 2021-10-26 DIAGNOSIS — J449 Chronic obstructive pulmonary disease, unspecified: Secondary | ICD-10-CM | POA: Diagnosis not present

## 2021-10-26 DIAGNOSIS — N186 End stage renal disease: Secondary | ICD-10-CM | POA: Diagnosis not present

## 2021-10-26 DIAGNOSIS — J690 Pneumonitis due to inhalation of food and vomit: Secondary | ICD-10-CM | POA: Diagnosis not present

## 2021-10-26 DIAGNOSIS — J9621 Acute and chronic respiratory failure with hypoxia: Secondary | ICD-10-CM | POA: Diagnosis not present

## 2021-10-26 MED ORDER — NEPRO/CARBSTEADY PO LIQD
237.0000 mL | Freq: Two times a day (BID) | ORAL | Status: DC
  Administered 2021-10-26 – 2021-10-30 (×6): 237 mL via ORAL

## 2021-10-26 MED ORDER — CHLORHEXIDINE GLUCONATE CLOTH 2 % EX PADS
6.0000 | MEDICATED_PAD | Freq: Every day | CUTANEOUS | Status: DC
  Administered 2021-10-26: 6 via TOPICAL

## 2021-10-26 MED ORDER — RENA-VITE PO TABS
1.0000 | ORAL_TABLET | Freq: Every day | ORAL | Status: DC
  Administered 2021-10-26 – 2021-10-29 (×4): 1 via ORAL
  Filled 2021-10-26 (×4): qty 1

## 2021-10-26 NOTE — Progress Notes (Signed)
  Mobility Specialist Criteria Algorithm Info.    10/26/21 1141  Mobility  Activity Dangled on edge of bed;Refused mobility  Range of Motion/Exercises Active;All extremities  Assistive Device None  Activity Response Tolerated poorly;RN notified   Patient with back pain and 5/5 dyspnea. Deferred further mobility at this time. Will f/u as time permits.   [place] Krissie Merrick, [place], [place]  FYBOF:[phone] Office: [phone]

## 2021-10-26 NOTE — Progress Notes (Signed)
[facility] Progress Note   Subjective: No events reported overnight. Looks better today but C/O that it's taking too  long to get his pain meds and he hurts. HD 10/27/2021 on schedule.    Objective Vitals:   10/25/21 2016 10/25/21 2121 10/26/21 0515 10/26/21 0815  BP:  (!) 131/55 (!) 130/54   Pulse:  79 77   Resp:  19 18   Temp:  98.3 F (36.8 C) 98.4 F (36.9 C)   TempSrc:      SpO2: 94% 93% 92% 91%  Weight:      Height:       Physical Exam General: Chronically ill very elderly male in NAD Heart: S1 S2 RRR No M/R/G Lungs: Clear upper lung fields, decreased in bases. Using O2 via [place]. No WOB.  Abdomen: NABS, NT Extremities:No LE edema Dialysis Access: R AVF + T/B   Additional Objective Labs: Basic Metabolic Panel: Recent Labs  Lab 10/22/21 0252 10/23/21 0451 10/24/21 0734  NA 135 138 139  K 4.0 4.4 4.3  CL 97* 98 97*  CO2 '23 31 27  '$ GLUCOSE 74 114* 96  BUN 33* 23 36*  CREATININE 4.61* 4.11* 6.07*  CALCIUM 9.0 8.4* 8.6*  PHOS  --   --  5.9*   Liver Function Tests: Recent Labs  Lab 10/21/21 1554 10/22/21 0252 10/23/21 0451 10/24/21 0734  AST 16 14*  --   --   ALT 13 13  --   --   ALKPHOS 185* 158*  --   --   BILITOT 1.3* 1.5* 0.4  --   PROT 6.5 5.9*  --   --   ALBUMIN 3.0* 2.8*  --  2.4*   Recent Labs  Lab 10/21/21 1554  LIPASE 23   CBC: Recent Labs  Lab 10/21/21 1554 10/22/21 0252 10/23/21 0451 10/24/21 0734 10/25/21 0948  WBC 10.8* 10.6* 7.3 7.6 5.9  NEUTROABS 8.6*  --   --   --  3.9  HGB 10.2* 9.7* 9.3* 9.4* 9.5*  HCT 31.2* 29.9* 28.5* 29.5* 29.2*  MCV 92.0 92.0 92.5 94.2 93.0  PLT 316 321 284 255 230   Blood Culture    Component Value Date/Time   SDES BLOOD LEFT ARM 10/22/2021 0318   SPECREQUEST  10/22/2021 0318    BOTTLES DRAWN AEROBIC AND ANAEROBIC Blood Culture adequate volume   CULT  10/22/2021 0318    NO GROWTH 3 DAYS Performed at [facility], [place] [address] [place], [place] [address]     REPTSTATUS PENDING 10/22/2021 [phone]    Cardiac Enzymes: No results for input(s): "CKTOTAL", "CKMB", "CKMBINDEX", "TROPONINI" in the last 168 hours. CBG: No results for input(s): "GLUCAP" in the last 168 hours. Iron Studies: No results for input(s): "IRON", "TIBC", "TRANSFERRIN", "FERRITIN" in the last 72 hours. '@lablastinr3'$ @ Studies/Results: DG Chest Right Decubitus  Result Date: 10/24/2021 CLINICAL DATA:  Shortness of breath, chest pain. EXAM: CHEST - RIGHT DECUBITUS COMPARISON:  Same day. FINDINGS: Right decubitus view demonstrates small free-flowing right pleural effusion. IMPRESSION: Small free flowing right pleural effusion is noted. Electronically Signed   By: Marijo Conception M.D.   On: 10/24/2021 16:11   DG CHEST PORT 1 VIEW  Result Date: 10/24/2021 CLINICAL DATA:  Shortness of breath EXAM: PORTABLE CHEST 1 VIEW COMPARISON:  Chest radiograph August 29 23 FINDINGS: Stable cardiac and mediastinal contours. Aortic athero sclerosis. Interval development of opacification within the right lower hemithorax which may represent combination of pleural fluid, lobar atelectasis and underlying  pulmonary parenchymal consolidation. Small left pleural effusion with underlying consolidation. No pneumothorax. Lumbar spinal fusion hardware. IMPRESSION: New large area of consolidation right lower hemithorax may represent combination of lobar atelectasis and pleural fluid. Recommend short-term follow-up chest radiograph. Small left effusion and underlying opacities. Electronically Signed   By: Lovey Newcomer M.D.   On: 10/24/2021 10:59   Medications:  ampicillin-sulbactam (UNASYN) IV 1.5 g (10/25/21 2157)   [START ON 10/27/2021] vancomycin      (feeding supplement) PROSource Plus  30 mL Oral BID BM   albuterol  2.5 mg Nebulization QID   aspirin EC  81 mg Oral Daily   busPIRone  5 mg Oral BID   diltiazem  240 mg Oral Daily   guaiFENesin  1,200 mg Oral BID   heparin  5,000 Units Subcutaneous Q8H   levothyroxine   50 mcg Oral QAC breakfast   lidocaine  1 patch Transdermal Daily   metoprolol tartrate  12.5 mg Oral BID   mupirocin ointment  1 Application Nasal BID   naphazoline-glycerin  1-2 drop Right Eye TID   neomycin-bacitracin-polymyxin  1 Application Topical TID   mouth rinse  15 mL Mouth Rinse 4 times per day   pravastatin  40 mg Oral QHS   senna  2 tablet Oral QHS   sodium chloride flush  3 mL Intravenous Q12H   tamsulosin  0.4 mg Oral Daily     Outpatient Dialysis Orders: MWF [facility]  4h  300/500  77 kg  3K/2.5Ca bath  Heparin 3000 units IV  RUE AVF - mircera 75 mcg IVP q2 weeks- last dose 10/15/21 - iron sucrose '50mg'$  q wk   Assessment/Plan:  Back and abdominal pain - associated with SOB.  CT consistent with pneumonia. On IV abx. Unfortunately HD did not help symptoms.   Plan per primary.  Lower extremity duplex without evidence of DVT.  Pain management per primary but avoid morphine and baclofen. Preferred narcotic agents for pain control are hydromorphone, fentanyl, and methadone. Morphine should not be used.  Avoid Baclofen and avoid oral sodium phosphate and magnesium citrate based laxatives / bowel preps.   ESRD - MWF-Next HD 10/27/2021.   Hypertension/volume  - bp stable, edema improved and now below his previous EDW. Lower on discharge. UF as tolerated.   Anemia  - Hgb 9.5. ESA recently given outpatient. No IV Fe with acute infection.   Metabolic bone disease -  Corrected calcium elevated but improving. No VDRA. PO4 close to goal.   Nutrition - Low albumin. Add protein supplements. on a regular diet, follow K+ and phos trends  Lavetta Geier H. Jaquarious Grey NP-C 10/26/2021, 8:59 AM  [employer] [phone]

## 2021-10-26 NOTE — Progress Notes (Signed)
PROGRESS NOTE    Wayne Meadows  AOZ:[id number] DOB: Sep 28, 1934 DOA: 10/21/2021 PCP: [name]   Brief Narrative: Wayne Meadows is a 87 y.o. male with history of paroxysmal atrial fibrillation, CAD, hypothyroidism, COPD, chronic respiratory failure with hypoxia, ESRD on HD who presented secondary to severe back pain.  CT chest obtained on admission which is negative for PE but significant for new T5 and T7 compression fractures in addition to previously known rib fractures.  Imaging was also concerning for possible aspiration pneumonia and patient was started on empiric Unasyn IV.  Patient was also started on pain medication for management of acute back pain from compression fractures.   Assessment and Plan:  Aspiration pneumonia Noted on imaging.  Patient without significant clinical symptoms.  Patient started empirically on Unasyn.  Associated mild leukocytosis which has resolved. Patient with fever overnight and prior MRSA positive test. Recent chest imaging was inconclusive for fluid vs worsening consolidation. Repeat chest x-ray suggest continued effusion. -Continue Vancomycin to cover possible MRSA infection -Continue Unasyn; last day of treatment is 9/4 -Incentive spirometer  Acute on chronic respiratory failure with hypoxia Secondary to aspiration pneumonia.  Patient requiring up to 4 L/min of oxygen with associated mild tachypnea on admission. Oxygen requirement increased to 6 L/min. Repeat chest x-ray obtained and shows new right lower lobe area of consolidation vs effusion. Right lateral decubitus chest x-ray suggests effusion. Oxygen weaned down to 3-4 L/min after second hemodialysis session. -Wean oxygen back to chronic 2.5 L/min as able -Continuous pulse ox  ESRD on HD Nephrology consulted on admission and are managing hemodialysis while inpatient.  Hyperkalemia Management with hemodialysis  Vertebral compression fractures (T5 and T7) Bilateral rib  fractures Patient started on port of care with analgesic regimen.  Patient initially started on Dilaudid IV for management. -Continue Norco as needed and continue Dilaudid IV for breakthrough  Paroxysmal atrial fibrillation Patient is in normal sinus rhythm.  Patient is prescribed diltiazem and metoprolol for management as an outpatient, however he is listed as not taking diltiazem.  Patient is not on anticoagulation as an outpatient. -Continue diltiazem and metoprolol  Hyperbilirubinemia Mild mostly unconjugated/indirect bilirubin.  Patient with known anemia which appears to be stable. Resolved. LDH low. Haptoglobin normal.  CAD No chest pain. -Continue aspirin and pravastatin  Hypothyroidism -Continue Synthroid  COPD Initially asymptomatic but symptoms may be worsening secondary to aspiration pneumonia. Worsening wheezing today. Continued hypoxia. Productive cough. Improving. -Scheduled Duonebs -Continue treatment for pneumonia -Flutter valve   DVT prophylaxis: Heparin subcu Code Status:   Code Status: Full Code Family Communication: None at bedside Disposition Plan: Discharge back to SNF once pain is better controlled   Consultants:  Nephrology  Procedures:  Hemodialysis  Antimicrobials: Unasyn IV    Subjective: Patient reports feeling better today. No issues overnight. Breathing well.  Objective: BP (!) 153/58 (BP Location: Left Arm)   Pulse 93   Temp 99.1 F (37.3 C) (Oral)   Resp 19   Ht '6\' 2"'$  (1.88 m)   Wt 74.7 kg   SpO2 91%   BMI 21.14 kg/m   Examination:  General exam: Appears calm and comfortable. Frail appearing. Respiratory system: Diminished. No wheezing/rales. Respiratory effort normal. Cardiovascular system: S1 & S2 heard, RRR. Gastrointestinal system: Abdomen is nondistended, soft and nontender. Normal bowel sounds heard. Central nervous system: Alert and oriented. No focal neurological deficits. Musculoskeletal: No edema. No calf  tenderness Skin: No cyanosis. No rashes Psychiatry: Judgement and insight appear normal. Mood &  affect appropriate.    Data Reviewed: I have personally reviewed following labs and imaging studies  CBC Lab Results  Component Value Date   WBC 5.9 10/25/2021   RBC 3.14 (L) 10/25/2021   HGB 9.5 (L) 10/25/2021   HCT 29.2 (L) 10/25/2021   MCV 93.0 10/25/2021   MCH 30.3 10/25/2021   PLT 230 10/25/2021   MCHC 32.5 10/25/2021   RDW 17.2 (H) 10/25/2021   LYMPHSABS 0.8 10/25/2021   MONOABS 1.0 10/25/2021   EOSABS 0.1 10/25/2021   BASOSABS 0.1 22/97/9892     Last metabolic panel Lab Results  Component Value Date   NA 139 10/24/2021   K 4.3 10/24/2021   CL 97 (L) 10/24/2021   CO2 27 10/24/2021   BUN 36 (H) 10/24/2021   CREATININE 6.07 (H) 10/24/2021   GLUCOSE 96 10/24/2021   GFRNONAA 9 (L) 10/24/2021   GFRAA 52 (L) 07/31/2017   CALCIUM 8.6 (L) 10/24/2021   PHOS 5.9 (H) 10/24/2021   PROT 5.9 (L) 10/22/2021   ALBUMIN 2.4 (L) 10/24/2021   BILITOT 0.4 10/23/2021   ALKPHOS 158 (H) 10/22/2021   AST 14 (L) 10/22/2021   ALT 13 10/22/2021   ANIONGAP 15 10/24/2021    GFR: Estimated Creatinine Clearance: 9.6 mL/min (A) (by C-G formula based on SCr of 6.07 mg/dL (H)).  Recent Results (from the past 240 hour(s))  SARS Coronavirus 2 by RT PCR (hospital order, performed in [facility] hospital lab) *cepheid single result test* Anterior Nasal Swab     Status: None   Collection Time: 10/21/21  3:54 PM   Specimen: Anterior Nasal Swab  Result Value Ref Range Status   SARS Coronavirus 2 by RT PCR NEGATIVE NEGATIVE Final    Comment: (NOTE) SARS-CoV-2 target nucleic acids are NOT DETECTED.  The SARS-CoV-2 RNA is generally detectable in upper and lower respiratory specimens during the acute phase of infection. The lowest concentration of SARS-CoV-2 viral copies this assay can detect is 250 copies / mL. A negative result does not preclude SARS-CoV-2 infection and should not be used as the  sole basis for treatment or other patient management decisions.  A negative result may occur with improper specimen collection / handling, submission of specimen other than nasopharyngeal swab, presence of viral mutation(s) within the areas targeted by this assay, and inadequate number of viral copies (<250 copies / mL). A negative result must be combined with clinical observations, patient history, and epidemiological information.  Fact Sheet for Patients:   https://www.patel.info/  Fact Sheet for Healthcare Providers: https://hall.com/  This test is not yet approved or  cleared by the [place] FDA and has been authorized for detection and/or diagnosis of SARS-CoV-2 by FDA under an Emergency Use Authorization (EUA).  This EUA will remain in effect (meaning this test can be used) for the duration of the COVID-19 declaration under Section 564(b)(1) of the Act, 21 U.S.C. section 360bbb-3(b)(1), unless the authorization is terminated or revoked sooner.  Performed at [facility], [place] [address], [place], [place] [address]   Expectorated Sputum Assessment w Gram Stain, Rflx to Resp Cult     Status: None   Collection Time: 10/22/21  1:56 AM   Specimen: Expectorated Sputum  Result Value Ref Range Status   Specimen Description EXPECTORATED SPUTUM  Final   Special Requests NONE  Final   Sputum evaluation   Final    Sputum specimen not acceptable for testing.  Please recollect.   NOTIFIED MANUEL CASTRO ON 10/23/21 @ 2019  BY DRT Performed at [facility], [place] [address] [place], [place] [address]    Report Status 10/23/2021 FINAL  Final  MRSA Next Gen by PCR, Nasal     Status: Abnormal   Collection Time: 10/22/21  2:05 AM   Specimen: Nasal Mucosa; Nasal Swab  Result Value Ref Range Status   MRSA by PCR Next Gen DETECTED (A) NOT DETECTED Final    Comment: CRITICAL RESULT CALLED TO, READ BACK BY AND VERIFIED WITH:  Prescott Gum, RN 10/22/21 0545 A. LAFRANCE (NOTE) The GeneXpert MRSA Assay (FDA approved for NASAL specimens only), is one component of a comprehensive MRSA colonization surveillance program. It is not intended to diagnose MRSA infection nor to guide or monitor treatment for MRSA infections. Test performance is not FDA approved in patients less than 4 years old. Performed at [facility], [place] [address], [place], [place] [address]   Blood culture (routine x 2)     Status: None (Preliminary result)   Collection Time: 10/22/21  3:13 AM   Specimen: BLOOD LEFT HAND  Result Value Ref Range Status   Specimen Description BLOOD LEFT HAND  Final   Special Requests   Final    BOTTLES DRAWN AEROBIC AND ANAEROBIC Blood Culture adequate volume   Culture   Final    NO GROWTH 3 DAYS Performed at [facility], [place] [address] [place], [place] [address]    Report Status PENDING  Incomplete  Blood culture (routine x 2)     Status: None (Preliminary result)   Collection Time: 10/22/21  3:18 AM   Specimen: BLOOD LEFT ARM  Result Value Ref Range Status   Specimen Description BLOOD LEFT ARM  Final   Special Requests   Final    BOTTLES DRAWN AEROBIC AND ANAEROBIC Blood Culture adequate volume   Culture   Final    NO GROWTH 3 DAYS Performed at [facility], [place] [address], [place], [place] [address]    Report Status PENDING  Incomplete      Radiology Studies: DG CHEST PORT 1 VIEW  Result Date: 10/26/2021 CLINICAL DATA:  Pneumonia. EXAM: PORTABLE CHEST 1 VIEW COMPARISON:  October 24, 2021. FINDINGS: Stable cardiomegaly. Increased bibasilar atelectasis or edema is noted with associated pleural effusions. Bony thorax is unremarkable. IMPRESSION: Increased bibasilar atelectasis or edema is noted with associated pleural effusions. Electronically Signed   By: Marijo Conception M.D.   On: 10/26/2021 09:55   DG Chest Right Decubitus  Result Date: 10/24/2021 CLINICAL DATA:  Shortness of breath,  chest pain. EXAM: CHEST - RIGHT DECUBITUS COMPARISON:  Same day. FINDINGS: Right decubitus view demonstrates small free-flowing right pleural effusion. IMPRESSION: Small free flowing right pleural effusion is noted. Electronically Signed   By: Marijo Conception M.D.   On: 10/24/2021 16:11      LOS: 5 days    Cordelia Poche, MD [facility] 10/26/2021, 11:28 AM   If 7PM-7AM, please contact night-coverage [email or web address]

## 2021-10-27 DIAGNOSIS — J449 Chronic obstructive pulmonary disease, unspecified: Secondary | ICD-10-CM | POA: Diagnosis not present

## 2021-10-27 DIAGNOSIS — J9621 Acute and chronic respiratory failure with hypoxia: Secondary | ICD-10-CM | POA: Diagnosis not present

## 2021-10-27 DIAGNOSIS — J690 Pneumonitis due to inhalation of food and vomit: Secondary | ICD-10-CM | POA: Diagnosis not present

## 2021-10-27 DIAGNOSIS — N186 End stage renal disease: Secondary | ICD-10-CM | POA: Diagnosis not present

## 2021-10-27 LAB — CULTURE, BLOOD (ROUTINE X 2)
Culture: NO GROWTH
Culture: NO GROWTH
Special Requests: ADEQUATE
Special Requests: ADEQUATE

## 2021-10-27 MED ORDER — ALTEPLASE 2 MG IJ SOLR: 2.0000 mg | Freq: Once | INTRAMUSCULAR | Status: DC | PRN

## 2021-10-27 MED ORDER — PENTAFLUOROPROP-TETRAFLUOROETH EX AERO: 1.0000 | INHALATION_SPRAY | CUTANEOUS | Status: DC | PRN

## 2021-10-27 MED ORDER — HEPARIN SODIUM (PORCINE) 1000 UNIT/ML IJ SOLN
INTRAMUSCULAR | Status: AC
  Administered 2021-10-27: 3000 [IU]
  Filled 2021-10-27: qty 3

## 2021-10-27 MED ORDER — SALINE SPRAY 0.65 % NA SOLN
1.0000 | NASAL | Status: DC | PRN
Start: 2021-10-27 — End: 2021-10-30
  Administered 2021-10-28 – 2021-10-30 (×5): 1 via NASAL
  Filled 2021-10-27: qty 44

## 2021-10-27 MED ORDER — HEPARIN SODIUM (PORCINE) 1000 UNIT/ML DIALYSIS: 1000.0000 [IU] | INTRAMUSCULAR | Status: DC | PRN

## 2021-10-27 MED ORDER — LIDOCAINE HCL (PF) 1 % IJ SOLN: 5.0000 mL | INTRAMUSCULAR | Status: DC | PRN

## 2021-10-27 MED ORDER — ANTICOAGULANT SODIUM CITRATE 4% (200MG/5ML) IV SOLN: 5.0000 mL | Status: DC | PRN

## 2021-10-27 MED ORDER — LIDOCAINE-PRILOCAINE 2.5-2.5 % EX CREA: 1.0000 | TOPICAL_CREAM | CUTANEOUS | Status: DC | PRN

## 2021-10-27 NOTE — Progress Notes (Signed)
Physical Therapy Treatment Patient Details Name: Wayne Meadows MRN: [id number] DOB: [birth year] Today's Date: 10/27/2021   History of Present Illness 87 y.o. male presenting to the emergency department with severe back pain and missed HD treatment. Fell 2 weeks ago with +rib fractures (improving) but worsening back pain. CTA chest is negative for PE but notable for new T5 and T7 compression fractures. PMH significant for paroxysmal atrial fibrillation not anticoagulated, CAD, hypothyroidism, COPD with chronic hypoxic respiratory failure, and ESRD on hemodialysis, now    PT Comments    Patient having increased pain compared to previous sessions. Willing to try and was able to sit up on side of bed x 20 minutes with sats increasing to 91%. Patient not able to attempt standing due to pain and returned to sidelying.     Recommendations for follow up therapy are one component of a multi-disciplinary discharge planning process, led by the attending physician.  Recommendations may be updated based on patient status, additional functional criteria and insurance authorization.  Follow Up Recommendations  Skilled nursing-short term rehab (<3 hours/day) Can patient physically be transported by private vehicle: No   Assistance Recommended at Discharge Frequent or constant Supervision/Assistance  Patient can return home with the following Assistance with cooking/housework;Assist for transportation;Help with stairs or ramp for entrance;A lot of help with walking and/or transfers;A lot of help with bathing/dressing/bathroom   Equipment Recommendations  None recommended by PT    Recommendations for Other Services       Precautions / Restrictions Precautions Precautions: Fall Precaution Comments: has had several falls; monitor sats Restrictions Weight Bearing Restrictions: No     Mobility  Bed Mobility Overal bed mobility: Needs Assistance Bed Mobility: Rolling, Sidelying to Sit, Sit to  Sidelying Rolling: Modified independent (Device/Increase time) Sidelying to sit: Modified independent (Device/Increase time)   Sit to supine: Supervision   General bed mobility comments: pt able to come to EOB without physical assist with HOB elevated and use of rail; return to bed near need for assist to raise legs    Transfers                   General transfer comment: pt refused to attempt due to pain    Ambulation/Gait                   Stairs             Wheelchair Mobility    Modified Rankin (Stroke Patients Only)       Balance Overall balance assessment: History of Falls, Needs assistance Sitting-balance support: No upper extremity supported, Feet supported Sitting balance-Leahy Scale: Good                                      Cognition Arousal/Alertness: Awake/alert Behavior During Therapy: WFL for tasks assessed/performed Overall Cognitive Status: Within Functional Limits for tasks assessed                                          Exercises      General Comments General comments (skin integrity, edema, etc.): Sat EOB x 20 minutes with pursed lip breathing with sats 86-91% on 6L      Pertinent Vitals/Pain Pain Assessment Pain Assessment: Faces Faces Pain Scale: Hurts worst Pain Location: ribs and back  no matter what position Pain Descriptors / Indicators: Sore, Sharp Pain Intervention(s): Limited activity within patient's tolerance, Repositioned, RN gave pain meds during session    Home Living                          Prior Function            PT Goals (current goals can now be found in the care plan section) Acute Rehab PT Goals Patient Stated Goal: return to [facility] but ultimately return home Time For Goal Achievement: 11/05/21 Potential to Achieve Goals: Good Progress towards PT goals: Not progressing toward goals - comment (limited by incr pain)    Frequency     Min 2X/week      PT Plan Current plan remains appropriate    Co-evaluation              AM-PAC PT "6 Clicks" Mobility   Outcome Measure  Help needed turning from your back to your side while in a flat bed without using bedrails?: None Help needed moving from lying on your back to sitting on the side of a flat bed without using bedrails?: None Help needed moving to and from a bed to a chair (including a wheelchair)?: A Little Help needed standing up from a chair using your arms (e.g., wheelchair or bedside chair)?: A Little Help needed to walk in hospital room?: Total Help needed climbing 3-5 steps with a railing? : Total 6 Click Score: 16    End of Session Equipment Utilized During Treatment: Oxygen Activity Tolerance: Patient limited by pain Patient left: with call bell/phone within reach;in bed Nurse Communication: Mobility status;Patient requests pain meds PT Visit Diagnosis: Unsteadiness on feet (R26.81);Muscle weakness (generalized) (M62.81);Difficulty in walking, not elsewhere classified (R26.2);Pain Pain - Right/Left:  (bilateral) Pain - part of body:  (back and ribs)     Time: 4268-3419 PT Time Calculation (min) (ACUTE ONLY): 31 min  Charges:  $Therapeutic Activity: 23-37 mins                      Arby Barrette, PT Acute Rehabilitation Services  Office [phone]    Rexanne Mano 10/27/2021, 11:34 AM

## 2021-10-27 NOTE — Progress Notes (Signed)
PROGRESS NOTE    [name]  RUE:[id number] DOB: 1934/06/13 DOA: 10/21/2021 PCP: [name]   Brief Narrative: Wayne Meadows is a 87 y.o. male with history of paroxysmal atrial fibrillation, CAD, hypothyroidism, COPD, chronic respiratory failure with hypoxia, ESRD on HD who presented secondary to severe back pain.  CT chest obtained on admission which is negative for PE but significant for new T5 and T7 compression fractures in addition to previously known rib fractures.  Imaging was also concerning for possible aspiration pneumonia and patient was started on empiric Unasyn IV.  Patient was also started on pain medication for management of acute back pain from compression fractures.   Assessment and Plan:  Aspiration pneumonia Noted on imaging.  Patient without significant clinical symptoms.  Patient started empirically on Unasyn.  Associated mild leukocytosis which has resolved. Patient with fever overnight and prior MRSA positive test. Recent chest imaging was inconclusive for fluid vs worsening consolidation. Repeat chest x-ray suggest continued effusion. -Continue Vancomycin to cover possible MRSA infection -Continue Unasyn; last day of treatment is today -Incentive spirometer  Acute on chronic respiratory failure with hypoxia Secondary to aspiration pneumonia.  Patient requiring up to 4 L/min of oxygen with associated mild tachypnea on admission. Oxygen requirement increased to 6 L/min. Repeat chest x-ray obtained and shows new right lower lobe area of consolidation vs effusion. Right lateral decubitus chest x-ray suggests effusion. Oxygen weaned down to 4-5 L/min. -Wean oxygen back to chronic 2.5 L/min as able -Continuous pulse ox  ESRD on HD Nephrology consulted on admission and are managing hemodialysis while inpatient.  Hyperkalemia Management with hemodialysis  Vertebral compression fractures (T5 and T7) Bilateral rib fractures Patient started on  port of care with analgesic regimen.  Patient initially started on Dilaudid IV for management. -Continue Norco as needed and continue Dilaudid IV for breakthrough  Paroxysmal atrial fibrillation Patient is in normal sinus rhythm.  Patient is prescribed diltiazem and metoprolol for management as an outpatient, however he is listed as not taking diltiazem.  Patient is not on anticoagulation as an outpatient. -Continue diltiazem and metoprolol  Hyperbilirubinemia Mild mostly unconjugated/indirect bilirubin.  Patient with known anemia which appears to be stable. Resolved. LDH low. Haptoglobin normal.  CAD No chest pain. -Continue aspirin and pravastatin  Hypothyroidism -Continue Synthroid  COPD Initially asymptomatic but symptoms may be worsening secondary to aspiration pneumonia. Worsening wheezing today. Continued hypoxia. Productive cough. Improving. -Scheduled Duonebs -Continue treatment for pneumonia -Flutter valve   DVT prophylaxis: Heparin subcu Code Status:   Code Status: Full Code Family Communication: None at bedside Disposition Plan: Discharge back to SNF once pain is better controlled   Consultants:  Nephrology  Procedures:  Hemodialysis  Antimicrobials: Unasyn IV    Subjective: Patient with anxiety that he believes is contributing to his respiratory issues.  Objective: BP (!) 152/55 (BP Location: Left Arm)   Pulse 81   Temp 98.3 F (36.8 C) (Oral)   Resp 14   Ht '6\' 2"'$  (1.88 m)   Wt 76.6 kg   SpO2 100%   BMI 21.68 kg/m   Examination:  General exam: Appears calm and comfortable. Frail appearing. Respiratory system: Diminished, no significant wheezing. Cardiovascular system: S1 & S2 heard, RRR. Gastrointestinal system: Abdomen is nondistended, soft and nontender. Normal bowel sounds heard. Central nervous system: Alert and oriented. No focal neurological deficits. Musculoskeletal: No edema. No calf tenderness Skin: No cyanosis. No  rashes Psychiatry: Judgement and insight appear normal. Mood & affect appropriate.  Data Reviewed: I have personally reviewed following labs and imaging studies  CBC Lab Results  Component Value Date   WBC 5.9 10/25/2021   RBC 3.14 (L) 10/25/2021   HGB 9.5 (L) 10/25/2021   HCT 29.2 (L) 10/25/2021   MCV 93.0 10/25/2021   MCH 30.3 10/25/2021   PLT 230 10/25/2021   MCHC 32.5 10/25/2021   RDW 17.2 (H) 10/25/2021   LYMPHSABS 0.8 10/25/2021   MONOABS 1.0 10/25/2021   EOSABS 0.1 10/25/2021   BASOSABS 0.1 67/67/2094     Last metabolic panel Lab Results  Component Value Date   NA 139 10/24/2021   K 4.3 10/24/2021   CL 97 (L) 10/24/2021   CO2 27 10/24/2021   BUN 36 (H) 10/24/2021   CREATININE 6.07 (H) 10/24/2021   GLUCOSE 96 10/24/2021   GFRNONAA 9 (L) 10/24/2021   GFRAA 52 (L) 07/31/2017   CALCIUM 8.6 (L) 10/24/2021   PHOS 5.9 (H) 10/24/2021   PROT 5.9 (L) 10/22/2021   ALBUMIN 2.4 (L) 10/24/2021   BILITOT 0.4 10/23/2021   ALKPHOS 158 (H) 10/22/2021   AST 14 (L) 10/22/2021   ALT 13 10/22/2021   ANIONGAP 15 10/24/2021    GFR: Estimated Creatinine Clearance: 9.8 mL/min (A) (by C-G formula based on SCr of 6.07 mg/dL (H)).  Recent Results (from the past 240 hour(s))  SARS Coronavirus 2 by RT PCR (hospital order, performed in [facility] hospital lab) *cepheid single result test* Anterior Nasal Swab     Status: None   Collection Time: 10/21/21  3:54 PM   Specimen: Anterior Nasal Swab  Result Value Ref Range Status   SARS Coronavirus 2 by RT PCR NEGATIVE NEGATIVE Final    Comment: (NOTE) SARS-CoV-2 target nucleic acids are NOT DETECTED.  The SARS-CoV-2 RNA is generally detectable in upper and lower respiratory specimens during the acute phase of infection. The lowest concentration of SARS-CoV-2 viral copies this assay can detect is 250 copies / mL. A negative result does not preclude SARS-CoV-2 infection and should not be used as the sole basis for treatment or  other patient management decisions.  A negative result may occur with improper specimen collection / handling, submission of specimen other than nasopharyngeal swab, presence of viral mutation(s) within the areas targeted by this assay, and inadequate number of viral copies (<250 copies / mL). A negative result must be combined with clinical observations, patient history, and epidemiological information.  Fact Sheet for Patients:   https://www.patel.info/  Fact Sheet for Healthcare Providers: https://hall.com/  This test is not yet approved or  cleared by the [place] FDA and has been authorized for detection and/or diagnosis of SARS-CoV-2 by FDA under an Emergency Use Authorization (EUA).  This EUA will remain in effect (meaning this test can be used) for the duration of the COVID-19 declaration under Section 564(b)(1) of the Act, 21 U.S.C. section 360bbb-3(b)(1), unless the authorization is terminated or revoked sooner.  Performed at [facility], [place] [address], [place], [place] [address]   Expectorated Sputum Assessment w Gram Stain, Rflx to Resp Cult     Status: None   Collection Time: 10/22/21  1:56 AM   Specimen: Expectorated Sputum  Result Value Ref Range Status   Specimen Description EXPECTORATED SPUTUM  Final   Special Requests NONE  Final   Sputum evaluation   Final    Sputum specimen not acceptable for testing.  Please recollect.   NOTIFIED MANUEL CASTRO ON 10/23/21 @ 2019 BY DRT Performed at [facility], [place] [address] [place], [place] [address]    Report Status 10/23/2021 FINAL  Final  MRSA Next Gen by PCR, Nasal     Status: Abnormal   Collection Time: 10/22/21  2:05 AM   Specimen: Nasal Mucosa; Nasal Swab  Result Value Ref Range Status   MRSA by PCR Next Gen DETECTED (A) NOT DETECTED Final    Comment: CRITICAL RESULT CALLED TO, READ BACK BY AND VERIFIED WITH:  Prescott Gum, RN 10/22/21 0545 A.  LAFR[place] (NOTE) The GeneXpert MRSA Assay (FDA approved for NASAL specimens only), is one component of a comprehensive MRSA colonization surveillance program. It is not intended to diagnose MRSA infection nor to guide or monitor treatment for MRSA infections. Test performance is not FDA approved in patients less than 75 years old. Performed at [facility], [place] [address], [place], [place] [address]   Blood culture (routine x 2)     Status: None   Collection Time: 10/22/21  3:13 AM   Specimen: BLOOD LEFT HAND  Result Value Ref Range Status   Specimen Description BLOOD LEFT HAND  Final   Special Requests   Final    BOTTLES DRAWN AEROBIC AND ANAEROBIC Blood Culture adequate volume   Culture   Final    NO GROWTH 5 DAYS Performed at [facility], [place] [address] [place], [place] [address]    Report Status 10/27/2021 FINAL  Final  Blood culture (routine x 2)     Status: None   Collection Time: 10/22/21  3:18 AM   Specimen: BLOOD LEFT ARM  Result Value Ref Range Status   Specimen Description BLOOD LEFT ARM  Final   Special Requests   Final    BOTTLES DRAWN AEROBIC AND ANAEROBIC Blood Culture adequate volume   Culture   Final    NO GROWTH 5 DAYS Performed at [facility], [place] [address], [place], [place] [address]    Report Status 10/27/2021 FINAL  Final      Radiology Studies: DG CHEST PORT 1 VIEW  Result Date: 10/26/2021 CLINICAL DATA:  Pneumonia. EXAM: PORTABLE CHEST 1 VIEW COMPARISON:  October 24, 2021. FINDINGS: Stable cardiomegaly. Increased bibasilar atelectasis or edema is noted with associated pleural effusions. Bony thorax is unremarkable. IMPRESSION: Increased bibasilar atelectasis or edema is noted with associated pleural effusions. Electronically Signed   By: Marijo Conception M.D.   On: 10/26/2021 09:55      LOS: 6 days    Cordelia Poche, MD [facility] 10/27/2021, 1:55 PM   If 7PM-7AM, please contact night-coverage [email or web address]

## 2021-10-27 NOTE — Progress Notes (Signed)
  Mobility Specialist Criteria Algorithm Info.   10/27/21 1226  Mobility  Activity Dangled on edge of bed;Refused mobility  Activity Response Tolerated poorly   Patient dangled EOB and deferred further mobility. Still very SOB from earlier session with PT and is to go for Dialysis soon. Will f/u as time permits.   [place] Lisandro Meggett, [place], [place]  OZHYQ:[phone] Office: [phone]

## 2021-10-27 NOTE — Progress Notes (Signed)
[facility] Progress Note   Subjective: Seen in room. Again C/O pain. Says he hasn't gotten medication but pain meds being given as ordered, last dilaudid at 0912. HD later today on schedule.   Objective Vitals:   10/26/21 2113 10/27/21 0551 10/27/21 0843 10/27/21 0844  BP: (!) 141/53 (!) 159/73    Pulse: 87 80    Resp: 20 20    Temp: 99 F (37.2 C) 98.4 F (36.9 C)    TempSrc: Oral Oral    SpO2: 94% 93% 91% 93%  Weight:      Height:       Physical Exam General: Chronically ill very elderly male in NAD Heart: S1 S2 RRR No M/R/G Lungs: Clear upper lung fields, decreased in bases. Using O2 via [place]. No WOB.  Abdomen: NABS, NT Extremities:No LE edema Dialysis Access: R AVF + T/B    Additional Objective Labs: Basic Metabolic Panel: Recent Labs  Lab 10/22/21 0252 10/23/21 0451 10/24/21 0734  NA 135 138 139  K 4.0 4.4 4.3  CL 97* 98 97*  CO2 '23 31 27  '$ GLUCOSE 74 114* 96  BUN 33* 23 36*  CREATININE 4.61* 4.11* 6.07*  CALCIUM 9.0 8.4* 8.6*  PHOS  --   --  5.9*   Liver Function Tests: Recent Labs  Lab 10/21/21 1554 10/22/21 0252 10/23/21 0451 10/24/21 0734  AST 16 14*  --   --   ALT 13 13  --   --   ALKPHOS 185* 158*  --   --   BILITOT 1.3* 1.5* 0.4  --   PROT 6.5 5.9*  --   --   ALBUMIN 3.0* 2.8*  --  2.4*   Recent Labs  Lab 10/21/21 1554  LIPASE 23   CBC: Recent Labs  Lab 10/21/21 1554 10/22/21 0252 10/23/21 0451 10/24/21 0734 10/25/21 0948  WBC 10.8* 10.6* 7.3 7.6 5.9  NEUTROABS 8.6*  --   --   --  3.9  HGB 10.2* 9.7* 9.3* 9.4* 9.5*  HCT 31.2* 29.9* 28.5* 29.5* 29.2*  MCV 92.0 92.0 92.5 94.2 93.0  PLT 316 321 284 255 230   Blood Culture    Component Value Date/Time   SDES BLOOD LEFT ARM 10/22/2021 0318   SPECREQUEST  10/22/2021 0318    BOTTLES DRAWN AEROBIC AND ANAEROBIC Blood Culture adequate volume   CULT  10/22/2021 0318    NO GROWTH 5 DAYS Performed at [facility], [place] [address] [place], [place] [address]     REPTSTATUS 10/27/2021 FINAL 10/22/2021 5638    Cardiac Enzymes: No results for input(s): "CKTOTAL", "CKMB", "CKMBINDEX", "TROPONINI" in the last 168 hours. CBG: No results for input(s): "GLUCAP" in the last 168 hours. Iron Studies: No results for input(s): "IRON", "TIBC", "TRANSFERRIN", "FERRITIN" in the last 72 hours. '@lablastinr3'$ @ Studies/Results: DG CHEST PORT 1 VIEW  Result Date: 10/26/2021 CLINICAL DATA:  Pneumonia. EXAM: PORTABLE CHEST 1 VIEW COMPARISON:  October 24, 2021. FINDINGS: Stable cardiomegaly. Increased bibasilar atelectasis or edema is noted with associated pleural effusions. Bony thorax is unremarkable. IMPRESSION: Increased bibasilar atelectasis or edema is noted with associated pleural effusions. Electronically Signed   By: Marijo Conception M.D.   On: 10/26/2021 09:55   Medications:  ampicillin-sulbactam (UNASYN) IV 1.5 g (10/26/21 2137)   vancomycin      (feeding supplement) PROSource Plus  30 mL Oral BID BM   albuterol  2.5 mg Nebulization QID   aspirin EC  81 mg Oral Daily  busPIRone  5 mg Oral BID   Chlorhexidine Gluconate Cloth  6 each Topical Q0600   diltiazem  240 mg Oral Daily   feeding supplement (NEPRO CARB STEADY)  237 mL Oral BID BM   guaiFENesin  1,200 mg Oral BID   heparin  5,000 Units Subcutaneous Q8H   levothyroxine  50 mcg Oral QAC breakfast   lidocaine  1 patch Transdermal Daily   metoprolol tartrate  12.5 mg Oral BID   multivitamin  1 tablet Oral QHS   naphazoline-glycerin  1-2 drop Right Eye TID   neomycin-bacitracin-polymyxin  1 Application Topical TID   mouth rinse  15 mL Mouth Rinse 4 times per day   pravastatin  40 mg Oral QHS   senna  2 tablet Oral QHS   sodium chloride flush  3 mL Intravenous Q12H   tamsulosin  0.4 mg Oral Daily     Outpatient Dialysis Orders: MWF [facility]  4h  300/500  77 kg  3K/2.5Ca bath  Heparin 3000 units IV  RUE AVF - mircera 75 mcg IVP q2 weeks- last dose 10/15/21 - iron sucrose '50mg'$  q wk    Assessment/Plan:  Back and abdominal pain - associated with SOB.  CT consistent with pneumonia. On IV abx. Unfortunately HD did not help symptoms.   Plan per primary.  Lower extremity duplex without evidence of DVT.  Pain management per primary but avoid morphine and baclofen. Preferred narcotic agents for pain control are hydromorphone, fentanyl, and methadone. Morphine should not be used.  Avoid Baclofen and avoid oral sodium phosphate and magnesium citrate based laxatives / bowel preps.   ESRD - [facility] HD 10/29/2021.   Hypertension/volume  - bp stable, edema improved and now below his previous EDW. Lower on discharge. UF as tolerated.   Anemia  - Hgb 9.5. ESA recently given outpatient. No IV Fe with acute infection.   Metabolic bone disease -  Corrected calcium elevated but improving. No VDRA. PO4 close to goal.   Nutrition - Low albumin. Add protein supplements. on a regular diet, follow K+ and phos trends  Dreshawn Hendershott H. Douglas Smolinsky NP-C 10/27/2021, 12:17 PM  [employer] [phone]

## 2021-10-27 NOTE — Progress Notes (Signed)
Pt off the unit to hemodialysis

## 2021-10-28 DIAGNOSIS — J9621 Acute and chronic respiratory failure with hypoxia: Secondary | ICD-10-CM | POA: Diagnosis not present

## 2021-10-28 DIAGNOSIS — J690 Pneumonitis due to inhalation of food and vomit: Secondary | ICD-10-CM | POA: Diagnosis not present

## 2021-10-28 DIAGNOSIS — J449 Chronic obstructive pulmonary disease, unspecified: Secondary | ICD-10-CM | POA: Diagnosis not present

## 2021-10-28 DIAGNOSIS — N186 End stage renal disease: Secondary | ICD-10-CM | POA: Diagnosis not present

## 2021-10-28 MED ORDER — PREDNISONE 20 MG PO TABS
40.0000 mg | ORAL_TABLET | Freq: Every day | ORAL | Status: DC
  Administered 2021-10-29 – 2021-10-30 (×2): 40 mg via ORAL
  Filled 2021-10-28 (×3): qty 2

## 2021-10-28 MED ORDER — DOXYCYCLINE HYCLATE 100 MG PO TABS
100.0000 mg | ORAL_TABLET | Freq: Two times a day (BID) | ORAL | Status: DC
  Administered 2021-10-28 – 2021-10-30 (×5): 100 mg via ORAL
  Filled 2021-10-28 (×5): qty 1

## 2021-10-28 MED ORDER — DARBEPOETIN ALFA 100 MCG/0.5ML IJ SOSY
100.0000 ug | PREFILLED_SYRINGE | INTRAMUSCULAR | Status: DC
  Administered 2021-10-29: 100 ug via INTRAVENOUS
  Filled 2021-10-28 (×2): qty 0.5

## 2021-10-28 NOTE — Progress Notes (Signed)
PROGRESS NOTE    Wayne Meadows  HCW:[id number] DOB: 22-Apr-1934 DOA: 10/21/2021 PCP: [name]   Brief Narrative: Wayne Meadows is a 87 y.o. male with history of paroxysmal atrial fibrillation, CAD, hypothyroidism, COPD, chronic respiratory failure with hypoxia, ESRD on HD who presented secondary to severe back pain.  CT chest obtained on admission which is negative for PE but significant for new T5 and T7 compression fractures in addition to previously known rib fractures.  Imaging was also concerning for possible aspiration pneumonia and patient was started on empiric Unasyn IV.  Patient was also started on pain medication for management of acute back pain from compression fractures.   Assessment and Plan:  Aspiration pneumonia Noted on imaging.  Patient without significant clinical symptoms.  Patient started empirically on Unasyn.  Associated mild leukocytosis which has resolved. Patient with fever overnight and prior MRSA positive test. Recent chest imaging was inconclusive for fluid vs worsening consolidation. Repeat chest x-ray suggest continued effusion. Patient completed course of Unasyn. -Switch Vancomycin to doxycycline -Incentive spirometer  Acute on chronic respiratory failure with hypoxia Secondary to aspiration pneumonia.  Patient requiring up to 4 L/min of oxygen with associated mild tachypnea on admission. Oxygen requirement increased to 6 L/min. Repeat chest x-ray obtained and shows new right lower lobe area of consolidation vs effusion. Right lateral decubitus chest x-ray suggests effusion. Difficulty weaning oxygen. Unsure if this is related to anxiety as patient's hypoxia isn't worsening with decreased oxygen rate. -Wean oxygen back to chronic 2.5 L/min as able -Continuous pulse ox -Will treat as possible COPD exacerbation and initiate steroids  ESRD on HD Nephrology consulted on admission and are managing hemodialysis while  inpatient.  Hyperkalemia Management with hemodialysis  Vertebral compression fractures (T5 and T7) Bilateral rib fractures Patient started on port of care with analgesic regimen.  Patient initially started on Dilaudid IV for management. -Continue Norco as needed and continue Dilaudid IV for breakthrough  Paroxysmal atrial fibrillation Patient is in normal sinus rhythm.  Patient is prescribed diltiazem and metoprolol for management as an outpatient, however he is listed as not taking diltiazem.  Patient is not on anticoagulation as an outpatient. -Continue diltiazem and metoprolol  Hyperbilirubinemia Mild mostly unconjugated/indirect bilirubin.  Patient with known anemia which appears to be stable. Resolved. LDH low. Haptoglobin normal.  CAD No chest pain. -Continue aspirin and pravastatin  Hypothyroidism -Continue Synthroid  COPD Initially asymptomatic but symptoms may be worsening secondary to aspiration pneumonia. Worsening wheezing today. Continued hypoxia. Productive cough. Improving. -Scheduled Duonebs -Continue treatment for pneumonia -Flutter valve -Add prednisone 40 mg daily x5 days   DVT prophylaxis: Heparin subcu Code Status:   Code Status: Full Code Family Communication: None at bedside Disposition Plan: Discharge back to SNF once pain is better controlled   Consultants:  Nephrology  Procedures:  Hemodialysis  Antimicrobials: Unasyn IV    Subjective: Patient reports continuing to feel better. Productive cough is improved. Patient was dyspneic with attempts to wean oxygen, but without associated hypoxia.  Objective: BP (!) 140/53 (BP Location: Left Wrist)   Pulse 76   Temp 98.2 F (36.8 C)   Resp 20   Ht '6\' 2"'$  (1.88 m)   Wt 74.2 kg   SpO2 93%   BMI 21.00 kg/m   Examination:  General exam: Appears calm and comfortable. Frail appearing. Respiratory system: Very mild end expiratory wheezing. Respiratory effort normal. Cardiovascular system: S1  & S2 heard, RRR. Gastrointestinal system: Abdomen is nondistended, soft and nontender.  Normal bowel sounds heard. Central nervous system: Alert and oriented. Musculoskeletal: No edema. No calf tenderness Psychiatry: Judgement and insight appear normal. Mood & affect appropriate.    Data Reviewed: I have personally reviewed following labs and imaging studies  CBC Lab Results  Component Value Date   WBC 5.9 10/25/2021   RBC 3.14 (L) 10/25/2021   HGB 9.5 (L) 10/25/2021   HCT 29.2 (L) 10/25/2021   MCV 93.0 10/25/2021   MCH 30.3 10/25/2021   PLT 230 10/25/2021   MCHC 32.5 10/25/2021   RDW 17.2 (H) 10/25/2021   LYMPHSABS 0.8 10/25/2021   MONOABS 1.0 10/25/2021   EOSABS 0.1 10/25/2021   BASOSABS 0.1 31/51/7616     Last metabolic panel Lab Results  Component Value Date   NA 139 10/24/2021   K 4.3 10/24/2021   CL 97 (L) 10/24/2021   CO2 27 10/24/2021   BUN 36 (H) 10/24/2021   CREATININE 6.07 (H) 10/24/2021   GLUCOSE 96 10/24/2021   GFRNONAA 9 (L) 10/24/2021   GFRAA 52 (L) 07/31/2017   CALCIUM 8.6 (L) 10/24/2021   PHOS 5.9 (H) 10/24/2021   PROT 5.9 (L) 10/22/2021   ALBUMIN 2.4 (L) 10/24/2021   BILITOT 0.4 10/23/2021   ALKPHOS 158 (H) 10/22/2021   AST 14 (L) 10/22/2021   ALT 13 10/22/2021   ANIONGAP 15 10/24/2021    GFR: Estimated Creatinine Clearance: 9.5 mL/min (A) (by C-G formula based on SCr of 6.07 mg/dL (H)).  Recent Results (from the past 240 hour(s))  SARS Coronavirus 2 by RT PCR (hospital order, performed in [facility] hospital lab) *cepheid single result test* Anterior Nasal Swab     Status: None   Collection Time: 10/21/21  3:54 PM   Specimen: Anterior Nasal Swab  Result Value Ref Range Status   SARS Coronavirus 2 by RT PCR NEGATIVE NEGATIVE Final    Comment: (NOTE) SARS-CoV-2 target nucleic acids are NOT DETECTED.  The SARS-CoV-2 RNA is generally detectable in upper and lower respiratory specimens during the acute phase of infection. The  lowest concentration of SARS-CoV-2 viral copies this assay can detect is 250 copies / mL. A negative result does not preclude SARS-CoV-2 infection and should not be used as the sole basis for treatment or other patient management decisions.  A negative result may occur with improper specimen collection / handling, submission of specimen other than nasopharyngeal swab, presence of viral mutation(s) within the areas targeted by this assay, and inadequate number of viral copies (<250 copies / mL). A negative result must be combined with clinical observations, patient history, and epidemiological information.  Fact Sheet for Patients:   https://www.patel.info/  Fact Sheet for Healthcare Providers: https://hall.com/  This test is not yet approved or  cleared by the [place] FDA and has been authorized for detection and/or diagnosis of SARS-CoV-2 by FDA under an Emergency Use Authorization (EUA).  This EUA will remain in effect (meaning this test can be used) for the duration of the COVID-19 declaration under Section 564(b)(1) of the Act, 21 U.S.C. section 360bbb-3(b)(1), unless the authorization is terminated or revoked sooner.  Performed at [facility], [place] [address], [place], [place] [address]   Expectorated Sputum Assessment w Gram Stain, Rflx to Resp Cult     Status: None   Collection Time: 10/22/21  1:56 AM   Specimen: Expectorated Sputum  Result Value Ref Range Status   Specimen Description EXPECTORATED SPUTUM  Final   Special Requests NONE  Final   Sputum evaluation  Final    Sputum specimen not acceptable for testing.  Please recollect.   NOTIFIED MANUEL CASTRO ON 10/23/21 @ 2019 BY DRT Performed at [facility], [place] [address] [place], [place] [address]    Report Status 10/23/2021 FINAL  Final  MRSA Next Gen by PCR, Nasal     Status: Abnormal   Collection Time: 10/22/21  2:05 AM   Specimen: Nasal Mucosa; Nasal  Swab  Result Value Ref Range Status   MRSA by PCR Next Gen DETECTED (A) NOT DETECTED Final    Comment: CRITICAL RESULT CALLED TO, READ BACK BY AND VERIFIED WITH:  Prescott Gum, RN 10/22/21 0545 A. LAFRANCE (NOTE) The GeneXpert MRSA Assay (FDA approved for NASAL specimens only), is one component of a comprehensive MRSA colonization surveillance program. It is not intended to diagnose MRSA infection nor to guide or monitor treatment for MRSA infections. Test performance is not FDA approved in patients less than 48 years old. Performed at [facility], [place] [address] [place], [place] [address]   Blood culture (routine x 2)     Status: None   Collection Time: 10/22/21  3:13 AM   Specimen: BLOOD LEFT HAND  Result Value Ref Range Status   Specimen Description BLOOD LEFT HAND  Final   Special Requests   Final    BOTTLES DRAWN AEROBIC AND ANAEROBIC Blood Culture adequate volume   Culture   Final    NO GROWTH 5 DAYS Performed at [facility], [place] [address], [place], [place] [address]    Report Status 10/27/2021 FINAL  Final  Blood culture (routine x 2)     Status: None   Collection Time: 10/22/21  3:18 AM   Specimen: BLOOD LEFT ARM  Result Value Ref Range Status   Specimen Description BLOOD LEFT ARM  Final   Special Requests   Final    BOTTLES DRAWN AEROBIC AND ANAEROBIC Blood Culture adequate volume   Culture   Final    NO GROWTH 5 DAYS Performed at [facility], [place] [address] [place],  [address]    Report Status 10/27/2021 FINAL  Final      Radiology Studies: No results found.    LOS: 7 days    Cordelia Poche, MD [facility] 10/28/2021, 12:07 PM   If 7PM-7AM, please contact night-coverage [email or web address]

## 2021-10-28 NOTE — TOC Progression Note (Signed)
Transition of Care [facility]) - Initial/Assessment Note    Patient Details  Name: Wayne Meadows MRN: [id number] Date of Birth: 1934-10-10  Transition of Care [facility]) CM/SW Contact:    Milinda Antis, [place] Phone Number: 10/28/2021, 11:23 AM  Clinical Narrative:                 Patient to return to [facility] where patient was LT resident when medically ready.    TOC will continue to follow.      Patient Goals and CMS Choice        Expected Discharge Plan and Services           Expected Discharge Date: 10/27/21                                    Prior Living Arrangements/Services                       Activities of Daily Living Home Assistive Devices/Equipment: None ADL Screening (condition at time of admission) Patient's cognitive ability adequate to safely complete daily activities?: Yes Is the patient deaf or have difficulty hearing?: No Does the patient have difficulty seeing, even when wearing glasses/contacts?: No Does the patient have difficulty concentrating, remembering, or making decisions?: No Patient able to express need for assistance with ADLs?: Yes Does the patient have difficulty dressing or bathing?: No Independently performs ADLs?: Yes (appropriate for developmental age) Does the patient have difficulty walking or climbing stairs?: Yes Weakness of Legs: Both Weakness of Arms/Hands: None  Permission Sought/Granted                  Emotional Assessment              Admission diagnosis:  Pleuritic chest pain [R07.81] Aspiration pneumonia ([place]) [J69.0] Pneumonia due to infectious organism, unspecified laterality, unspecified part of lung [J18.9] Back pain, unspecified back location, unspecified back pain laterality, unspecified chronicity [M54.9] Patient Active Problem List   Diagnosis Date Noted   Back pain    Aspiration pneumonia ([place]) 10/21/2021   Atrial fibrillation ([place]) 09/29/2021   Chronic respiratory failure  with hypoxia ([place]) 09/28/2021   Paroxysmal atrial flutter ([place]) 09/28/2021   Hyperkalemia 09/28/2021   ESRD (end stage renal disease) ([place]) 09/11/2021   Partial small bowel obstruction ([place]) 08/17/2021   Right lower lobe pneumonia 08/13/2021   Acute on chronic respiratory failure with hypoxia ([place]) 08/13/2021   Arteriovenous fistula infection ([place]) 04/18/2021   Gastric ulcer without hemorrhage or perforation    Duodenal ulcer    Elevated INR    Acute upper GI bleed 04/06/2021   ESRD (end stage renal disease) on MWF dialysis ([place]) 04/06/2021   Supratherapeutic INR 04/06/2021   Proteinuria 03/07/2021   AKI (acute kidney injury) ([place]) 03/05/2021   Hypothyroidism 03/05/2021   Anxiety 03/05/2021   Recurrent cellulitis of lower leg 03/04/2021   Chronic pancreatitis ([place]) 12/24/2020   Nausea and vomiting in adult 06/22/2020   Esophageal stricture    Hyperbilirubinemia 04/11/2016   Elevated lactic acid level    Segmental colitis ([place]) 09/04/2015   Noninfectious gastroenteritis, unspecified    Benign neoplasm of transverse colon    Benign neoplasm of colon    Bright red blood per rectum    Dysphagia    Renal cell cancer ([place])    Hypertension    Centrilobular emphysema ([place])    Blood in stool  08/19/2015   Rectal bleeding 08/19/2015   CAP (community acquired pneumonia) 04/08/2014   Overweight (BMI 25.0-29.9) 04/08/2014   Healthcare-associated pneumonia 10/26/2011   Cholelithiasis 04/26/2011   Paroxysmal atrial fibrillation ([place]) 04/26/2011   Chest pain 03/19/2011   Unstable angina ([place]) 03/18/2011    Class: Acute   Lung nodule 03/02/2011   Liver lesion 03/02/2011   Other hyperlipidemia 03/02/2011   Abnormal CT of the abdomen 02/23/2011   Non Q wave myocardial infarction ([place]) 02/22/2011    Class: Acute   History of CVA (cerebrovascular accident) 02/22/2011   CAD (coronary artery disease) 02/22/2011   COPD (chronic obstructive pulmonary disease) ([place]) 02/22/2011   Tobacco  abuse, 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/22/2011   PCP:  Clinic[facility]:   [id number] [place] [address] [address] [id number]: [phone]: [phone]    Social Determinants of Health (SDOH) Interventions    Readmission Risk Interventions    09/30/2021    2:03 PM 03/21/2021    1:21 PM 03/19/2021    1:23 PM  Readmission Risk Prevention Plan  Transportation Screening Complete Complete   Medication Review ([place] Complete    PCP or Specialist appointment within 3-5 days of discharge Complete    HRI or Home Care Consult Complete Complete   SW Recovery Care/Counseling Consult Complete Complete Complete  Palliative Care Screening Not Applicable    Skilled Nursing Facility Complete Complete Complete

## 2021-10-28 NOTE — Progress Notes (Signed)
[facility] Progress Note   Subjective:  Seen in room - breathing a little better. Denies CP or back pain at the moment.  Objective Vitals:   10/27/21 2052 10/27/21 2142 10/28/21 0832 10/28/21 0851  BP:  (!) 161/47  (!) 140/53  Pulse:  95  76  Resp:  19  20  Temp:  98 F (36.7 C)  98.2 F (36.8 C)  TempSrc:      SpO2: (!) 89% 96% 93% 93%  Weight:      Height:       Physical Exam General: Elderly man, NAD. Nasal O2 in place Heart: RRR; no murmur Lungs: CTA anteriolaterally Abdomen: soft Extremities: No LE edema Dialysis Access:  R AVF + thrill  Additional Objective Labs: Basic Metabolic Panel: Recent Labs  Lab 10/22/21 0252 10/23/21 0451 10/24/21 0734  NA 135 138 139  K 4.0 4.4 4.3  CL 97* 98 97*  CO2 '23 31 27  '$ GLUCOSE 74 114* 96  BUN 33* 23 36*  CREATININE 4.61* 4.11* 6.07*  CALCIUM 9.0 8.4* 8.6*  PHOS  --   --  5.9*   Liver Function Tests: Recent Labs  Lab 10/21/21 1554 10/22/21 0252 10/23/21 0451 10/24/21 0734  AST 16 14*  --   --   ALT 13 13  --   --   ALKPHOS 185* 158*  --   --   BILITOT 1.3* 1.5* 0.4  --   PROT 6.5 5.9*  --   --   ALBUMIN 3.0* 2.8*  --  2.4*   Recent Labs  Lab 10/21/21 1554  LIPASE 23   CBC: Recent Labs  Lab 10/21/21 1554 10/22/21 0252 10/23/21 0451 10/24/21 0734 10/25/21 0948  WBC 10.8* 10.6* 7.3 7.6 5.9  NEUTROABS 8.6*  --   --   --  3.9  HGB 10.2* 9.7* 9.3* 9.4* 9.5*  HCT 31.2* 29.9* 28.5* 29.5* 29.2*  MCV 92.0 92.0 92.5 94.2 93.0  PLT 316 321 284 255 230   Medications:  ampicillin-sulbactam (UNASYN) IV 1.5 g (10/28/21 0859)   vancomycin 750 mg (10/27/21 1702)    (feeding supplement) PROSource Plus  30 mL Oral BID BM   albuterol  2.5 mg Nebulization QID   aspirin EC  81 mg Oral Daily   busPIRone  5 mg Oral BID   Chlorhexidine Gluconate Cloth  6 each Topical Q0600   diltiazem  240 mg Oral Daily   feeding supplement (NEPRO CARB STEADY)  237 mL Oral BID BM   guaiFENesin  1,200 mg Oral BID    heparin  5,000 Units Subcutaneous Q8H   levothyroxine  50 mcg Oral QAC breakfast   lidocaine  1 patch Transdermal Daily   metoprolol tartrate  12.5 mg Oral BID   multivitamin  1 tablet Oral QHS   naphazoline-glycerin  1-2 drop Right Eye TID   neomycin-bacitracin-polymyxin  1 Application Topical TID   mouth rinse  15 mL Mouth Rinse 4 times per day   pravastatin  40 mg Oral QHS   senna  2 tablet Oral QHS   sodium chloride flush  3 mL Intravenous Q12H   tamsulosin  0.4 mg Oral Daily    Dialysis Orders: MWF [facility] 4h  300/500  77 kg  3K/2.5Ca bath  Heparin 3000 units IV, RUE AVF - mircera 75 mcg IVP q2 weeks- last dose 10/15/21 - iron sucrose '50mg'$  q wk  Assessment/Plan: Aspiration pneumonia: Initially on Unasyn -> now Vancomycin. WBC improving. Acute on chronic espiratory failure/COPD:  Still requiring O2. CXR 9/3 with bibasilar effusions. ESRD: Continue HD per usual MWF schedule - for HD tomorrow. HTN/volume: BP up slightly, UF as tolerated. Below current OP EDW - challenge further and will lower on discharge. Anemia of ESRD: Hgb 9.5 - due for next dose ESA tomorrow, will order Aranesp. Secondary HPTH: Ca ok, Phos high - binders not being given here, repeat labs tomorrow and start if above goal. Nutrition: Alb low, continue supplements. CAD Spinal compression Fx (T5/T7)   Veneta Penton, PA-C 10/28/2021, 10:28 AM  [facility]

## 2021-10-28 NOTE — Progress Notes (Addendum)
Mobility Specialist Progress Note:   10/28/21 1520  Mobility  Activity Transferred from bed to chair  Level of Assistance Standby assist, set-up cues, supervision of patient - no hands on  Assistive Device None  Distance Ambulated (ft) 3 ft  Activity Response Tolerated well  $Mobility charge 1 Mobility   Pt agreeable to mobility session despite significant L side pain. SpO2 88% on 6LO2 once sitting EOB, recovered to 92% at end of session in chair. Required no physical assist throughout transfer. Left in chair with all needs met.   Nelta Numbers Acute Rehab Secure Chat or Office Phone: [phone]

## 2021-10-29 ENCOUNTER — Inpatient Hospital Stay (HOSPITAL_COMMUNITY): Payer: No Typology Code available for payment source

## 2021-10-29 DIAGNOSIS — J189 Pneumonia, unspecified organism: Secondary | ICD-10-CM

## 2021-10-29 LAB — CBC
HCT: 31 % — ABNORMAL LOW (ref 39.0–52.0)
Hemoglobin: 9.9 g/dL — ABNORMAL LOW (ref 13.0–17.0)
MCH: 29.1 pg (ref 26.0–34.0)
MCHC: 31.9 g/dL (ref 30.0–36.0)
MCV: 91.2 fL (ref 80.0–100.0)
Platelets: 275 10*3/uL (ref 150–400)
RBC: 3.4 MIL/uL — ABNORMAL LOW (ref 4.22–5.81)
RDW: 16.3 % — ABNORMAL HIGH (ref 11.5–15.5)
WBC: 5.1 10*3/uL (ref 4.0–10.5)
nRBC: 0 % (ref 0.0–0.2)

## 2021-10-29 LAB — RENAL FUNCTION PANEL
Albumin: 2.5 g/dL — ABNORMAL LOW (ref 3.5–5.0)
Anion gap: 15 (ref 5–15)
BUN: 42 mg/dL — ABNORMAL HIGH (ref 8–23)
CO2: 26 mmol/L (ref 22–32)
Calcium: 8.6 mg/dL — ABNORMAL LOW (ref 8.9–10.3)
Chloride: 95 mmol/L — ABNORMAL LOW (ref 98–111)
Creatinine, Ser: 4.76 mg/dL — ABNORMAL HIGH (ref 0.61–1.24)
GFR, Estimated: 11 mL/min — ABNORMAL LOW (ref >60)
Glucose, Bld: 143 mg/dL — ABNORMAL HIGH (ref 70–99)
Phosphorus: 4.4 mg/dL (ref 2.5–4.6)
Potassium: 4 mmol/L (ref 3.5–5.1)
Sodium: 136 mmol/L (ref 135–145)

## 2021-10-29 MED ORDER — MELATONIN 3 MG PO TABS
3.0000 mg | ORAL_TABLET | Freq: Every day | ORAL | Status: DC
  Administered 2021-10-29: 3 mg via ORAL
  Filled 2021-10-29: qty 1

## 2021-10-29 NOTE — Progress Notes (Signed)
Mobility Specialist Progress Note:   10/29/21 1320  Mobility  Activity Moved into chair position in bed  Level of Assistance Independent  Assistive Device None  Activity Response Tolerated well  $Mobility charge 1 Mobility   Pt agreeable to mobility however limited by lunch and HD today. Agreeable to sit up in bed to eat lunch. SpO2 WFL on 5LO2. Pt left with all needs met.     Acute Rehab Secure Chat or Office Phone: [phone]  

## 2021-10-29 NOTE — Progress Notes (Signed)
  Progress Note Patient: Wayne Meadows XBW:[id number] DOB: 09/15/1934 DOA: 10/21/2021  DOS: the patient was seen and examined on 10/29/2021  Brief hospital course: Wayne Meadows is a 87 y.o. male with history of paroxysmal atrial fibrillation, CAD, hypothyroidism, COPD, chronic respiratory failure with hypoxia, ESRD on HD who presented secondary to severe back pain.  CT chest obtained on admission which is negative for PE but significant for new T5 and T7 compression fractures in addition to previously known rib fractures.  Imaging was also concerning for possible aspiration pneumonia and patient was started on empiric Unasyn IV.  Patient was also started on pain medication for management of acute back pain from compression fractures. Assessment and Plan: Aspiration pneumonia COPD exacerbation. Acute on chronic hypoxic respiratory failure. Presented to hospital with complaints of back pain and increasing oxygen needs. Pneumonia seen on admission. At baseline uses 2.5 L of oxygen. On admission required 4 L of oxygen to maintain adequate saturation.  On 9/6 on 5 L of oxygen. Continue to wean. Continue incentive spirometry and flutter valve. Completed IV antibiotics. Prednisone was added on 9/6.  Continue.  Continue nebulizer therapy. Chest x-ray on 9/6 shows small to moderate bilateral pleural effusion and atelectasis. Volume management per nephrology.   ESRD on HD Nephrology consulted Volume management, electrolyte management, anemia management per nephrology with HD   Vertebral compression fractures (T5 and T7) Bilateral rib fractures Appears to be new. No focal deficit. Currently on a combination of Dilaudid, lidocaine patch, Norco. For now continue current regimen.  Paroxysmal atrial fibrillation Patient is in normal sinus rhythm. On Cardizem and metoprolol. Not on any anticoagulation as an outpatient most likely due to high fall risk. Currently tolerating both medicines.    Hyperbilirubinemia Mild mostly unconjugated/indirect bilirubin. Patient with known anemia which appears to be stable. Resolved. LDH low. Haptoglobin normal.   CAD No chest pain. -Continue aspirin and pravastatin   Hypothyroidism -Continue Synthroid  Subjective: Continues to have fatigue and tiredness.  No nausea no vomiting.  Reports left-sided chest pain after cough or deep breathing.  No fever no chills.  Physical Exam: Vitals:   10/29/21 1726 10/29/21 1809 10/29/21 1823 10/29/21 1824  BP:  (!) 149/50    Pulse:  72    Resp:      Temp:  98.7 F (37.1 C)    TempSrc:  Oral    SpO2:  91% (!) 89% 90%  Weight: 75.4 kg     Height:       General: Appear in moderate distress; no visible Abnormal Neck Mass Or lumps, Conjunctiva normal Cardiovascular: S1 and S2 Present, aortic systolic Murmur, Respiratory: good respiratory effort, Bilateral Air entry present and faint Crackles, no wheezes Abdomen: Bowel Sound present, Non tender  Extremities: no Pedal edema Neurology: alert and oriented to time, place, and person  Gait not checked due to patient safety concerns   Data Reviewed: I have Reviewed nursing notes, Vitals, and Lab results since pt's last encounter. Pertinent lab results CBC and BMP I have ordered test including CBC and BMP    Family Communication: No one at bedside  Disposition: Status is: Inpatient Remains inpatient appropriate because: Still hypoxic requiring further close observation and change in therapies.  Author: Berle Mull, MD 10/29/2021 7:45 PM  Please look on [email or web address] to find out who is on call.

## 2021-10-29 NOTE — Progress Notes (Signed)
[facility] Progress Note   Subjective:  Seen in room - looks a bit better today, breathing slowly improving. No CP. For HD later today.  Objective Vitals:   10/28/21 1745 10/28/21 2016 10/29/21 0009 10/29/21 0516  BP: (!) 164/59   (!) 164/60  Pulse: 70   73  Resp: 19   18  Temp: 98.3 F (36.8 C)   97.7 F (36.5 C)  TempSrc:      SpO2: 97% 94% 96% 95%  Weight:    76.2 kg  Height:       Physical Exam General: Elderly man, NAD. Nasal O2 in place Heart: RRR; no murmur Lungs: CTA anteriolaterally Abdomen: soft Extremities: No LE edema Dialysis Access:  R AVF + thrill  Additional Objective Labs: Basic Metabolic Panel: Recent Labs  Lab 10/23/21 0451 10/24/21 0734  NA 138 139  K 4.4 4.3  CL 98 97*  CO2 31 27  GLUCOSE 114* 96  BUN 23 36*  CREATININE 4.11* 6.07*  CALCIUM 8.4* 8.6*  PHOS  --  5.9*   Liver Function Tests: Recent Labs  Lab 10/23/21 0451 10/24/21 0734  BILITOT 0.4  --   ALBUMIN  --  2.4*   CBC: Recent Labs  Lab 10/23/21 0451 10/24/21 0734 10/25/21 0948  WBC 7.3 7.6 5.9  NEUTROABS  --   --  3.9  HGB 9.3* 9.4* 9.5*  HCT 28.5* 29.5* 29.2*  MCV 92.5 94.2 93.0  PLT 284 255 230   Medications:   (feeding supplement) PROSource Plus  30 mL Oral BID BM   albuterol  2.5 mg Nebulization QID   aspirin EC  81 mg Oral Daily   busPIRone  5 mg Oral BID   darbepoetin (ARANESP) injection - DIALYSIS  100 mcg Intravenous Q Wed-HD   diltiazem  240 mg Oral Daily   doxycycline  100 mg Oral Q12H   feeding supplement (NEPRO CARB STEADY)  237 mL Oral BID BM   guaiFENesin  1,200 mg Oral BID   heparin  5,000 Units Subcutaneous Q8H   levothyroxine  50 mcg Oral QAC breakfast   lidocaine  1 patch Transdermal Daily   metoprolol tartrate  12.5 mg Oral BID   multivitamin  1 tablet Oral QHS   naphazoline-glycerin  1-2 drop Right Eye TID   neomycin-bacitracin-polymyxin  1 Application Topical TID   mouth rinse  15 mL Mouth Rinse 4 times per day    pravastatin  40 mg Oral QHS   predniSONE  40 mg Oral Q breakfast   senna  2 tablet Oral QHS   sodium chloride flush  3 mL Intravenous Q12H   tamsulosin  0.4 mg Oral Daily    Dialysis Orders: MWF [facility] 4h  300/500  77 kg  3K/2.5Ca bath  Heparin 3000 units IV, RUE AVF - mircera 75 mcg IVP q2 weeks- last dose 10/15/21 - iron sucrose '50mg'$  q wk   Assessment/Plan: Aspiration pneumonia: Initially on Unasyn -> then Vancomycin -> now doxycycline. WBC improving. Acute on chronic espiratory failure/COPD:  Still requiring O2. CXR 9/3 with bibasilar effusions. ESRD: Continue HD per usual MWF schedule - for HD today. HTN/volume: BP up slightly, UF as tolerated. Below current OP EDW - challenge further and will lower on discharge. Will get updated CXR to see if effusions are less. Anemia of ESRD: Hgb 9.5 - for Aranesp today. Secondary HPTH: Ca ok, Phos high - binders not being given here, repeat labs tomorrow and start if above goal. Nutrition: Alb  low, continue supplements. CAD Spinal compression Fx (T5/T7): Pain control per primary.  Veneta Penton, PA-C 10/29/2021, 9:58 AM  [employer]

## 2021-10-29 NOTE — TOC Progression Note (Signed)
Transition of Care [facility]) - Initial/Assessment Note    Patient Details  Name: Wayne Meadows MRN: [id number] Date of Birth: [birth year]  Transition of Care [facility]) CM/SW Contact:    Milinda Antis, [place] Phone Number: 10/29/2021, 10:35 AM  Clinical Narrative:                 CSW was contacted by admissions at [facility] to inquire about patient's return.  CSW informed admissions that, per MD, patient is not medically ready to d/c today.  TOC will continue to follow.         Patient Goals and CMS Choice        Expected Discharge Plan and Services           Expected Discharge Date: 10/27/21                                    Prior Living Arrangements/Services                       Activities of Daily Living Home Assistive Devices/Equipment: None ADL Screening (condition at time of admission) Patient's cognitive ability adequate to safely complete daily activities?: Yes Is the patient deaf or have difficulty hearing?: No Does the patient have difficulty seeing, even when wearing glasses/contacts?: No Does the patient have difficulty concentrating, remembering, or making decisions?: No Patient able to express need for assistance with ADLs?: Yes Does the patient have difficulty dressing or bathing?: No Independently performs ADLs?: Yes (appropriate for developmental age) Does the patient have difficulty walking or climbing stairs?: Yes Weakness of Legs: Both Weakness of Arms/Hands: None  Permission Sought/Granted                  Emotional Assessment              Admission diagnosis:  Pleuritic chest pain [R07.81] Aspiration pneumonia ([place]) [J69.0] Pneumonia due to infectious organism, unspecified laterality, unspecified part of lung [J18.9] Back pain, unspecified back location, unspecified back pain laterality, unspecified chronicity [M54.9] Patient Active Problem List   Diagnosis Date Noted   Back pain    Aspiration pneumonia  ([place]) 10/21/2021   Atrial fibrillation ([place]) 09/29/2021   Chronic respiratory failure with hypoxia ([place]) 09/28/2021   Paroxysmal atrial flutter ([place]) 09/28/2021   Hyperkalemia 09/28/2021   ESRD (end stage renal disease) ([place]) 09/11/2021   Partial small bowel obstruction ([place]) 08/17/2021   Right lower lobe pneumonia 08/13/2021   Acute on chronic respiratory failure with hypoxia ([place]) 08/13/2021   Arteriovenous fistula infection ([place]) 04/18/2021   Gastric ulcer without hemorrhage or perforation    Duodenal ulcer    Elevated INR    Acute upper GI bleed 04/06/2021   ESRD (end stage renal disease) on MWF dialysis ([place]) 04/06/2021   Supratherapeutic INR 04/06/2021   Proteinuria 03/07/2021   AKI (acute kidney injury) ([place]) 03/05/2021   Hypothyroidism 03/05/2021   Anxiety 03/05/2021   Recurrent cellulitis of lower leg 03/04/2021   Chronic pancreatitis ([place]) 12/24/2020   Nausea and vomiting in adult 06/22/2020   Esophageal stricture    Hyperbilirubinemia 04/11/2016   Elevated lactic acid level    Segmental colitis ([place]) 09/04/2015   Noninfectious gastroenteritis, unspecified    Benign neoplasm of transverse colon    Benign neoplasm of colon    Bright red blood per rectum    Dysphagia    Renal cell cancer ([place])  Hypertension    Centrilobular emphysema ([place])    Blood in stool 08/19/2015   Rectal bleeding 08/19/2015   CAP (community acquired pneumonia) 04/08/2014   Overweight (BMI 25.0-29.9) 04/08/2014   Healthcare-associated pneumonia 10/26/2011   Cholelithiasis 04/26/2011   Paroxysmal atrial fibrillation ([place]) 04/26/2011   Chest pain 03/19/2011   Unstable angina ([place]) 03/18/2011    Class: Acute   Lung nodule 03/02/2011   Liver lesion 03/02/2011   Other hyperlipidemia 03/02/2011   Abnormal CT of the abdomen 02/23/2011   Non Q wave myocardial infarction ([place]) 02/22/2011    Class: Acute   History of CVA (cerebrovascular accident) 02/22/2011   CAD (coronary artery disease)  02/22/2011   COPD (chronic obstructive pulmonary disease) ([place]) 02/22/2011   Tobacco abuse, 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/22/2011   PCP:  Clinic, [place]:   [id number] [id number]: [phone]: [phone]    Social Determinants of Health (SDOH) Interventions    Readmission Risk Interventions    09/30/2021    2:03 PM 03/21/2021    1:21 PM 03/19/2021    1:23 PM  Readmission Risk Prevention Plan  Transportation Screening Complete Complete   Medication Review ([place] Complete    PCP or Specialist appointment within 3-5 days of discharge Complete    HRI or Home Care Consult Complete Complete   SW Recovery Care/Counseling Consult Complete Complete Complete  Palliative Care Screening Not Applicable    Skilled Nursing Facility Complete Complete Complete

## 2021-10-30 DIAGNOSIS — S2243XA Multiple fractures of ribs, bilateral, initial encounter for closed fracture: Secondary | ICD-10-CM | POA: Diagnosis present

## 2021-10-30 DIAGNOSIS — S22000S Wedge compression fracture of unspecified thoracic vertebra, sequela: Secondary | ICD-10-CM

## 2021-10-30 DIAGNOSIS — J690 Pneumonitis due to inhalation of food and vomit: Secondary | ICD-10-CM | POA: Diagnosis not present

## 2021-10-30 MED ORDER — DM-GUAIFENESIN ER 30-600 MG PO TB12
1.0000 | ORAL_TABLET | Freq: Two times a day (BID) | ORAL | Status: DC
  Administered 2021-10-30: 1 via ORAL
  Filled 2021-10-30: qty 1

## 2021-10-30 MED ORDER — HYDROCODONE-ACETAMINOPHEN 10-325 MG PO TABS: 1.0000 | ORAL_TABLET | ORAL | 0 refills | Status: DC | PRN

## 2021-10-30 MED ORDER — ASPIRIN 81 MG PO TBEC: 81.0000 mg | DELAYED_RELEASE_TABLET | Freq: Every day | ORAL | 0 refills | Status: DC

## 2021-10-30 MED ORDER — BENZONATATE 100 MG PO CAPS: 100.0000 mg | ORAL_CAPSULE | Freq: Three times a day (TID) | ORAL | 0 refills | Status: AC

## 2021-10-30 MED ORDER — DM-GUAIFENESIN ER 30-600 MG PO TB12: 1.0000 | ORAL_TABLET | Freq: Two times a day (BID) | ORAL | 0 refills | Status: AC

## 2021-10-30 MED ORDER — BENZONATATE 100 MG PO CAPS
100.0000 mg | ORAL_CAPSULE | Freq: Three times a day (TID) | ORAL | Status: DC
  Administered 2021-10-30: 100 mg via ORAL
  Filled 2021-10-30: qty 1

## 2021-10-30 MED ORDER — HYDROCODONE-ACETAMINOPHEN 10-325 MG PO TABS
1.0000 | ORAL_TABLET | ORAL | Status: DC | PRN
  Administered 2021-10-30: 1 via ORAL
  Filled 2021-10-30: qty 1

## 2021-10-30 MED ORDER — PANTOPRAZOLE SODIUM 40 MG PO TBEC: 40.0000 mg | DELAYED_RELEASE_TABLET | Freq: Every day | ORAL | 0 refills | Status: DC

## 2021-10-30 MED ORDER — GUAIFENESIN 100 MG/5ML PO LIQD: 10.0000 mL | ORAL | 0 refills | Status: DC | PRN

## 2021-10-30 MED ORDER — METOPROLOL TARTRATE 25 MG PO TABS: 12.5000 mg | ORAL_TABLET | Freq: Two times a day (BID) | ORAL | 0 refills | Status: AC

## 2021-10-30 MED ORDER — GUAIFENESIN 100 MG/5ML PO LIQD
10.0000 mL | Freq: Three times a day (TID) | ORAL | Status: DC
  Administered 2021-10-30: 10 mL via ORAL
  Filled 2021-10-30: qty 15

## 2021-10-30 MED ORDER — BUSPIRONE HCL 5 MG PO TABS: 5.0000 mg | ORAL_TABLET | Freq: Three times a day (TID) | ORAL | 0 refills | Status: AC

## 2021-10-30 MED ORDER — PREDNISONE 10 MG PO TABS: ORAL_TABLET | ORAL | 0 refills | Status: DC

## 2021-10-30 MED ORDER — RENA-VITE PO TABS: 1.0000 | ORAL_TABLET | Freq: Every day | ORAL | 0 refills | Status: DC

## 2021-10-30 MED ORDER — HYDROMORPHONE HCL 1 MG/ML IJ SOLN: 0.5000 mg | INTRAMUSCULAR | Status: DC | PRN

## 2021-10-30 MED ORDER — DILTIAZEM HCL ER COATED BEADS 240 MG PO CP24: 240.0000 mg | ORAL_CAPSULE | Freq: Every day | ORAL | 0 refills | Status: DC

## 2021-10-30 NOTE — Progress Notes (Signed)
Physical Therapy Treatment Patient Details Name: Wayne Meadows MRN: [id number] DOB: 27-Sep-1934 Today's Date: 10/30/2021   History of Present Illness 87 y.o. male presenting to the emergency department with severe back pain and missed HD treatment. Fell 2 weeks ago with +rib fractures (improving) but worsening back pain. CTA chest is negative for PE but notable for new T5 and T7 compression fractures. PMH significant for paroxysmal atrial fibrillation not anticoagulated, CAD, hypothyroidism, COPD with chronic hypoxic respiratory failure, and ESRD on hemodialysis, now    PT Comments    Patient doing better overall with only on 3L NCO2. Able to slowly progress to sitting EOB, standing, and then walking in room on 3L with lowest sats 84% with return to 88% in 2 minutes with seated pursed lip breathing.     Recommendations for follow up therapy are one component of a multi-disciplinary discharge planning process, led by the attending physician.  Recommendations may be updated based on patient status, additional functional criteria and insurance authorization.  Follow Up Recommendations  Skilled nursing-short term rehab (<3 hours/day) Can patient physically be transported by private vehicle: No   Assistance Recommended at Discharge Frequent or constant Supervision/Assistance  Patient can return home with the following Assistance with cooking/housework;Assist for transportation;Help with stairs or ramp for entrance;A lot of help with walking and/or transfers;A lot of help with bathing/dressing/bathroom   Equipment Recommendations  None recommended by PT    Recommendations for Other Services       Precautions / Restrictions Precautions Precautions: Fall Precaution Comments: has had several falls; monitor sats Restrictions Weight Bearing Restrictions: No     Mobility  Bed Mobility Overal bed mobility: Needs Assistance Bed Mobility: Rolling, Sidelying to Sit, Sit to Sidelying Rolling:  Modified independent (Device/Increase time) Sidelying to sit: Modified independent (Device/Increase time)       General bed mobility comments: pt able to come to EOB without physical assist with HOB elevated and use of rail;    Transfers Overall transfer level: Needs assistance Equipment used: Rolling walker (2 wheels) Transfers: Sit to/from Stand Sit to Stand: Min assist           General transfer comment: slight imbalance upon standing with min assist to steady    Ambulation/Gait Ambulation/Gait assistance: Min guard Gait Distance (Feet): 15 Feet Assistive device: Rolling walker (2 wheels) Gait Pattern/deviations: Step-through pattern, Decreased stride length Gait velocity: decreased     General Gait Details: on 3L with lowest sats 84% and recovered to 88% in 2 minutes   Stairs             Wheelchair Mobility    Modified Rankin (Stroke Patients Only)       Balance Overall balance assessment: History of Falls, Needs assistance Sitting-balance support: No upper extremity supported, Feet supported Sitting balance-Leahy Scale: Good     Standing balance support: Bilateral upper extremity supported, During functional activity Standing balance-Leahy Scale: Poor Standing balance comment: reliant on UE support for safety                            Cognition Arousal/Alertness: Awake/alert Behavior During Therapy: WFL for tasks assessed/performed Overall Cognitive Status: Within Functional Limits for tasks assessed                                          Exercises Other Exercises Other  Exercises: pursed lip breathing with attempts to incr sats    General Comments General comments (skin integrity, edema, etc.): Incr time moving to EOB due to coughing and incr dyspnea.      Pertinent Vitals/Pain Pain Assessment Pain Assessment: 0-10 Pain Score: 5  Pain Location: ribs and back no matter what position Pain Descriptors /  Indicators: Sore, Sharp Pain Intervention(s): Limited activity within patient's tolerance, Monitored during session, Premedicated before session, Repositioned    Home Living                          Prior Function            PT Goals (current goals can now be found in the care plan section) Acute Rehab PT Goals Patient Stated Goal: return to [facility] but ultimately return home Time For Goal Achievement: 11/05/21 Potential to Achieve Goals: Good Progress towards PT goals: Progressing toward goals (after medical decline)    Frequency    Min 2X/week      PT Plan Current plan remains appropriate    Co-evaluation              AM-PAC PT "6 Clicks" Mobility   Outcome Measure  Help needed turning from your back to your side while in a flat bed without using bedrails?: None Help needed moving from lying on your back to sitting on the side of a flat bed without using bedrails?: None Help needed moving to and from a bed to a chair (including a wheelchair)?: A Little Help needed standing up from a chair using your arms (e.g., wheelchair or bedside chair)?: A Little Help needed to walk in hospital room?: Total Help needed climbing 3-5 steps with a railing? : Total 6 Click Score: 16    End of Session Equipment Utilized During Treatment: Oxygen Activity Tolerance: Patient limited by pain;Treatment limited secondary to medical complications (Comment) (decr sats) Patient left: with call bell/phone within reach;in chair Nurse Communication: Mobility status PT Visit Diagnosis: Unsteadiness on feet (R26.81);Muscle weakness (generalized) (M62.81);Difficulty in walking, not elsewhere classified (R26.2);Pain Pain - Right/Left:  (bilateral) Pain - part of body:  (back and ribs)     Time: 3557-3220 PT Time Calculation (min) (ACUTE ONLY): 23 min  Charges:  $Therapeutic Activity: 8-22 mins                      [place]  Office  [phone]    Rexanne Mano 10/30/2021, 9:16 AM

## 2021-10-30 NOTE — Progress Notes (Signed)
Patient ID: Wayne Meadows, male   DOB: September 28, 1934, 87 y.o.   MRN: [id number]  Attempted to call report to [facility]. Phone number left with Network engineer. Informed that [facility] was here now to pick patient up. Secretary stated nurse would call me to obtain report.  Haydee Salter, RN

## 2021-10-30 NOTE — TOC Transition Note (Signed)
Transition of Care [facility]) - CM/SW Discharge Note   Patient Details  Name: Wayne Meadows MRN: [id number] Date of Birth: [birth year]  [facility] CM/SW Contact:  Milinda Antis, [place] Phone Number: 10/30/2021, 12:18 PM   Clinical Narrative:     Patient will DC to: [facility] Anticipated DC date: 10/30/2021 Family notified: Yes Transport by: Corey Harold   Per MD patient ready for DC to SNF. RN to call report prior to discharge [phone] room 706A. RN,  patient's family, and facility notified of DC. Discharge Summary and FL2 sent to facility. DC packet on chart. Ambulance transport requested for patient.   CSW will sign off for now as social work intervention is no longer needed. Please consult [place] again if new needs arise.    Final next level of care: Skilled Nursing Facility Barriers to Discharge: No Barriers Identified   Patient Goals and CMS Choice        Discharge Placement              Patient chooses bed at:  ([facility]) Patient to be transferred to facility by: [place] Name of family member notified: Lyna Poser (Daughter)   [phone] Patient and family notified of of transfer: 10/30/21  Discharge Plan and Services                                     Social Determinants of Health (SDOH) Interventions     Readmission Risk Interventions    09/30/2021    2:03 PM 03/21/2021    1:21 PM 03/19/2021    1:23 PM  Readmission Risk Prevention Plan  Transportation Screening Complete Complete   Medication Review [employer] photographer) Complete    PCP or Specialist appointment within 3-5 days of discharge Complete    HRI or Home Care Consult Complete Complete   SW Recovery Care/Counseling Consult Complete Complete Complete  Palliative Care Screening Not Applicable    Skilled Nursing Facility Complete Complete Complete

## 2021-10-30 NOTE — Discharge Summary (Signed)
Physician Discharge Summary   Patient: Wayne Meadows MRN: [id number] DOB: January 19, 1935  Admit date:     10/21/2021  Discharge date: 10/30/21  Discharge Physician: Berle Mull  PCP: [name]  Recommendations at discharge: Follow up with PCP in 1 week.  Recommend referral to IR if back pain does not improve with PT.    Follow-up Information     Clinic, [place]. Schedule an appointment as soon as possible for a visit in 1 week(s).   Contact information: [place] [address] [phone]                Discharge Diagnoses: Principal Problem:   Aspiration pneumonia ([place]) Active Problems:   ESRD (end stage renal disease) on MWF dialysis ([place])   COPD (chronic obstructive pulmonary disease) (HCC)   CAD (coronary artery disease)   History of CVA (cerebrovascular accident)   Paroxysmal atrial fibrillation (HCC)   Hypertension   Hypothyroidism   Acute on chronic respiratory failure with hypoxia (HCC)   Back pain   Thoracic compression fracture, sequela   Multiple fractures of ribs, bilateral, initial encounter for closed fracture  Hospital Course: Wayne Meadows is a 87 y.o. male with history of paroxysmal atrial fibrillation, CAD, hypothyroidism, COPD, chronic respiratory failure with hypoxia, ESRD on HD who presented secondary to severe back pain.  CT chest obtained on admission which is negative for PE but significant for new T5 and T7 compression fractures in addition to previously known rib fractures.  Imaging was also concerning for possible aspiration pneumonia and patient was started on empiric Unasyn IV.  Patient was also started on pain medication for management of acute back pain from compression fractures. Assessment and Plan  Aspiration pneumonia COPD exacerbation. Acute on chronic hypoxic respiratory failure. Presented to hospital with complaints of back pain and increasing oxygen needs. Pneumonia  seen on admission. At baseline uses 2.5 L of oxygen. On admission required 4 L of oxygen to maintain adequate saturation.  On 9/6 on 5 L of oxygen. Weaned to 3 lpm at rest and 4 lpm on exertion.  Continue incentive spirometry and flutter valve. Completed antibiotics. Prednisone was added on 9/6.  Continue taper.  Continue nebulizer therapy. Chest x-ray on 9/6 shows small to moderate bilateral pleural effusion and atelectasis. Much better than before.  Needing 3-4 LPM on exertion for adequate sats. Volume management per nephrology.   ESRD on HD Nephrology consulted Volume management, electrolyte management, anemia management per nephrology with HD   Vertebral compression fractures (T5 and T7) Bilateral rib fractures Appears to be new. No focal deficit. Currently on a combination of Dilaudid, lidocaine patch, Norco. Continue norco and lidocaine patch,   Paroxysmal atrial fibrillation Patient is in normal sinus rhythm. On Cardizem and metoprolol. Not on any anticoagulation as an outpatient most likely due to high fall risk. Currently tolerating both medicines.   Hyperbilirubinemia Mild mostly unconjugated/indirect bilirubin. Patient with known anemia which appears to be stable. Resolved. LDH low. Haptoglobin normal.   CAD No chest pain. -Continue aspirin and pravastatin   Hypothyroidism -Continue Synthroid   Pain control - [place] Controlled Substance Reporting System database was reviewed. and patient was instructed, not to drive, operate heavy machinery, perform activities at heights, swimming or participation in water activities or provide baby-sitting services while on Pain, Sleep and Anxiety Medications; until their outpatient Physician has advised to do so again. Also recommended to not to take more than prescribed Pain, Sleep and Anxiety Medications.  Consultants:  Nephrology   Procedures performed:  HD  DISCHARGE MEDICATION: Allergies as of 10/30/2021        Reactions   Bee Venom Anaphylaxis   Has epi pen   Influenza Vaccines Other (See Comments)   "Mortally sick for 2 weeks"        Medication List     STOP taking these medications    camphor-menthol lotion Commonly known as: SARNA   cefdinir 300 MG capsule Commonly known as: OMNICEF   DECUBI-VITE PO   hydrOXYzine 10 MG tablet Commonly known as: ATARAX   lanthanum 1000 MG chewable tablet Commonly known as: FOSRENOL   multivitamin with minerals Tabs tablet   ondansetron 4 MG tablet Commonly known as: Zofran   oxyCODONE 5 MG immediate release tablet Commonly known as: Oxy IR/ROXICODONE       TAKE these medications    acetaminophen 325 MG tablet Commonly known as: TYLENOL Take 2 tablets (650 mg total) by mouth every 6 (six) hours as needed for mild pain (or Fever >/= 101).   aspirin EC 81 MG tablet Take 1 tablet (81 mg total) by mouth daily. Swallow whole. Start taking on: October 31, 2021   benzonatate 100 MG capsule Commonly known as: TESSALON Take 1 capsule (100 mg total) by mouth 3 (three) times daily.   Biofreeze 4 % Gel Generic drug: Menthol (Topical Analgesic) Apply 1 Application topically 3 (three) times daily as needed (pain).   busPIRone 5 MG tablet Commonly known as: BUSPAR Take 1 tablet (5 mg total) by mouth 3 (three) times daily. (0800 & 2000) What changed: when to take this   Darbepoetin Alfa 60 MCG/0.3ML Sosy injection Commonly known as: ARANESP Inject 0.3 mLs (60 mcg total) into the vein every Monday with hemodialysis.   dextromethorphan-guaiFENesin 30-600 MG 12hr tablet Commonly known as: MUCINEX DM Take 1 tablet by mouth 2 (two) times daily.   diltiazem 240 MG 24 hr capsule Commonly known as: CARDIZEM CD Take 1 capsule (240 mg total) by mouth daily.   guaiFENesin 100 MG/5ML liquid Commonly known as: ROBITUSSIN Take 10 mLs by mouth every 4 (four) hours as needed for cough or to loosen phlegm.   HYDROcodone-acetaminophen 10-325  MG tablet Commonly known as: NORCO Take 1 tablet by mouth every 4 (four) hours as needed for severe pain or moderate pain.   ipratropium-albuterol 0.5-2.5 (3) MG/3ML Soln Commonly known as: DUONEB Take 3 mLs by nebulization every 4 (four) hours as needed (shortness of breath/wheezing).   Ipratropium-Albuterol 20-100 MCG/ACT Aers respimat Commonly known as: COMBIVENT Inhale 1 puff into the lungs 4 (four) times daily. (0900, 1300, 1700 & 2100)   levothyroxine 50 MCG tablet Commonly known as: SYNTHROID Take 50 mcg by mouth daily before breakfast. (0630)   Lidocaine Pain Relief 4 % Generic drug: lidocaine Place 1 patch onto the skin daily. Apply 1 patch to back in the morning and remove at bedtime for pain.   melatonin 5 MG Tabs Take 5 mg by mouth at bedtime.   metoprolol tartrate 25 MG tablet Commonly known as: LOPRESSOR Take 0.5 tablets (12.5 mg total) by mouth 2 (two) times daily.   multivitamin Tabs tablet Take 1 tablet by mouth at bedtime.   naphazoline-glycerin 0.012-0.25 % Soln Commonly known as: CLEAR EYES REDNESS Place 1-2 drops into the right eye 3 (three) times daily.   neomycin-bacitracin-polymyxin Oint Commonly known as: NEOSPORIN Apply 1 application topically 3 (three) times daily. To right antecubital area   NovaSource Renal Liqd Take  237 mLs by mouth in the morning and at bedtime. (0800 & 1700)   pantoprazole 40 MG tablet Commonly known as: PROTONIX Take 1 tablet (40 mg total) by mouth daily for 14 days. What changed: when to take this   polyethylene glycol 17 g packet Commonly known as: MIRALAX / GLYCOLAX Take 17 g by mouth daily.   pravastatin 40 MG tablet Commonly known as: PRAVACHOL Take 40 mg by mouth at bedtime. (2100)   predniSONE 10 MG tablet Commonly known as: DELTASONE Take '40mg'$  daily for 3days,Take '30mg'$  daily for 3days,Take '20mg'$  daily for 3days,Take '10mg'$  daily for 3days, then stop Start taking on: October 31, 2021   PRO-STAT SUGAR  FREE PO Take 30 mLs by mouth every evening.   senna 8.6 MG tablet Commonly known as: SENOKOT Take 2 tablets by mouth at bedtime.   tamsulosin 0.4 MG Caps capsule Commonly known as: FLOMAX Take 1 capsule (0.4 mg total) by mouth daily.               Durable Medical Equipment  (From admission, onward)           Start     Ordered   10/30/21 1119  For home use only DME oxygen  Once       Question Answer Comment  Length of Need Lifetime   Mode or (Route) Nasal cannula   Liters per Minute 4   Frequency Continuous (stationary and portable oxygen unit needed)   Oxygen delivery system Gas      10/30/21 1118              Discharge Care Instructions  (From admission, onward)           Start     Ordered   10/30/21 0000  Discharge wound care:       Comments: Wash wound on lower mid spine area with saline, pat dry. Place a foam dressing over the area. Change every 3 days and prn.   10/30/21 1117           Disposition: SNF Diet recommendation: Renal diet  Discharge Exam: Vitals:   10/30/21 0408 10/30/21 0600 10/30/21 0800 10/30/21 1030  BP:   (!) 139/57 (!) 143/54  Pulse:   72 72  Resp:   20   Temp:   98.7 F (37.1 C)   TempSrc:   Oral   SpO2:  (!) 35% 93%   Weight: 72.9 kg     Height:       General: Appear in mild distress; no visible Abnormal Neck Mass Or lumps, Conjunctiva normal Cardiovascular: S1 and S2 Present, no Murmur, Respiratory: good respiratory effort, Bilateral Air entry present and no Crackles, Occasional  wheezes Abdomen: Bowel Sound present, Non tender  Extremities: no Pedal edema Neurology: alert and oriented to time, place, and person  Filed Weights   10/29/21 1308 10/29/21 1726 10/30/21 0408  Weight: 76.4 kg 75.4 kg 72.9 kg   Condition at discharge: stable  The results of significant diagnostics from this hospitalization (including imaging, microbiology, ancillary and laboratory) are listed below for reference.   Imaging  Studies: DG CHEST PORT 1 VIEW  Result Date: 10/29/2021 CLINICAL DATA:  Short of breath. Pleural effusion. Follow-up study. EXAM: PORTABLE CHEST 1 VIEW COMPARISON:  10/26/2021 and older exams.  CT, 10/21/2021. FINDINGS: Bilateral lung base opacities obscure the hemidiaphragms consistent with a combination of small to moderate effusions and atelectasis. Perihilar so opacities noted on the previous day's study have improved, which may  be due to more wrecked positioning on the current exam. No convincing pulmonary edema. Underlying changes of COPD/emphysema, stable. No pneumothorax. IMPRESSION: 1. No convincing change from the most recent prior exam. 2. Small to moderate bilateral effusions with associated lung base opacities, likely atelectasis. Consider a component of pneumonia if there are consistent clinical findings. 3. Underlying COPD/emphysema.  No convincing pulmonary edema. Electronically Signed   By: Lajean Manes M.D.   On: 10/29/2021 10:53   DG CHEST PORT 1 VIEW  Result Date: 10/26/2021 CLINICAL DATA:  Pneumonia. EXAM: PORTABLE CHEST 1 VIEW COMPARISON:  October 24, 2021. FINDINGS: Stable cardiomegaly. Increased bibasilar atelectasis or edema is noted with associated pleural effusions. Bony thorax is unremarkable. IMPRESSION: Increased bibasilar atelectasis or edema is noted with associated pleural effusions. Electronically Signed   By: Marijo Conception M.D.   On: 10/26/2021 09:55   DG Chest Right Decubitus  Result Date: 10/24/2021 CLINICAL DATA:  Shortness of breath, chest pain. EXAM: CHEST - RIGHT DECUBITUS COMPARISON:  Same day. FINDINGS: Right decubitus view demonstrates small free-flowing right pleural effusion. IMPRESSION: Small free flowing right pleural effusion is noted. Electronically Signed   By: Marijo Conception M.D.   On: 10/24/2021 16:11   DG CHEST PORT 1 VIEW  Result Date: 10/24/2021 CLINICAL DATA:  Shortness of breath EXAM: PORTABLE CHEST 1 VIEW COMPARISON:  Chest radiograph August  29 23 FINDINGS: Stable cardiac and mediastinal contours. Aortic athero sclerosis. Interval development of opacification within the right lower hemithorax which may represent combination of pleural fluid, lobar atelectasis and underlying pulmonary parenchymal consolidation. Small left pleural effusion with underlying consolidation. No pneumothorax. Lumbar spinal fusion hardware. IMPRESSION: New large area of consolidation right lower hemithorax may represent combination of lobar atelectasis and pleural fluid. Recommend short-term follow-up chest radiograph. Small left effusion and underlying opacities. Electronically Signed   By: Lovey Newcomer M.D.   On: 10/24/2021 10:59   CT Angio Chest PE W and/or Wo Contrast  Result Date: 10/21/2021 CLINICAL DATA:  87 year old with history of worsening RIGHT-sided pleuritic chest pain presents for evaluation for pulmonary embolism. EXAM: CT ANGIOGRAPHY CHEST WITH CONTRAST TECHNIQUE: Multidetector CT imaging of the chest was performed using the standard protocol during bolus administration of intravenous contrast. Multiplanar CT image reconstructions and MIPs were obtained to evaluate the vascular anatomy. RADIATION DOSE REDUCTION: This exam was performed according to the departmental dose-optimization program which includes automated exposure control, adjustment of the mA and/or kV according to patient size and/or use of iterative reconstruction technique. CONTRAST:  71m OMNIPAQUE IOHEXOL 350 MG/ML SOLN COMPARISON:  August 15, 2021. FINDINGS: Cardiovascular: Calcified aortic atherosclerosis also with noncalcified atherosclerotic plaque. Normal caliber of the thoracic aorta without acute process. Heart size is top normal. No pericardial effusion or nodularity. Three-vessel coronary artery disease. Extensive coronary artery calcifications. Central pulmonary vasculature opacified to 236 Hounsfield units, moderate study quality. The study is negative for pulmonary embolism to the  segmental level. Subsegmental branches are not well evaluated due to bolus timing Mediastinum/Nodes: No acute process or signs of adenopathy in the mediastinum. Lungs/Pleura: Signs of pulmonary emphysema. No pneumothorax. Pulmonary emphysema is moderate to marked and worse at the lung apices. Small LEFT effusion and LEFT basilar airspace disease. Material in LEFT lower lobe bronchi and mild bronchial wall thickening. Small Bochdalek's hernia containing fat on the RIGHT. Basilar atelectasis mixed with airspace disease and material in RIGHT lower lobe bronchi as well. Bronchial wall thickening to the RIGHT lung base. Upper Abdomen: Incidental imaging of upper  abdominal contents without acute process. Musculoskeletal: Signs of spinal fusion. Osteopenia. Spinal fusion in the upper lumbar spine is incompletely assessed. Subacute rib fractures in the bilateral chest without change compared to previous imaging. Visualized clavicles and scapulae as well as the sternum are unremarkable. Signs of recent compression fractures in the thoracic spine which have occurred since August 15, 2021 showing T5 with 50-60% loss of height along the anterior endplate. No signs of central canal impingement or posterior retropulsion of fracture elements. Sclerosis at this level suggests this may be subacute. Inferior endplate of T7 is fractured leading to approximately 40% loss of height at this level. Kyphotic angulation at the thoracolumbar transition is similar to previous imaging. This area and the fusion are incompletely assessed. Review of the MIP images confirms the above findings. IMPRESSION: 1. No evidence of pulmonary embolism to the segmental level. 2. New T5 and T7 compression fractures. T5 may be subacute but both appear recent and are new since previous imaging. Correlate with pain in these areas. 3. Bilateral subacute rib fractures with similar appearance to recent imaging. 4. Bibasilar airspace disease and material within lower  lobe bronchi suspicious for developing aspiration related pneumonia. 5. Pulmonary emphysema and aortic atherosclerosis. 6. Three-vessel coronary artery disease. Aortic Atherosclerosis (ICD10-I70.0) and Emphysema (ICD10-J43.9). Electronically Signed   By: Zetta Bills M.D.   On: 10/21/2021 18:44   VAS [place] LOWER EXTREMITY VENOUS (DVT) (ONLY [facility] & [facility])  Result Date: 10/21/2021  Lower Venous DVT Study Patient Name:  MATTHEW CINA  Date of Exam:   10/21/2021 Medical Rec #: [id number]         Accession #:    [id number] Date of Birth: 1935-02-05        Patient Gender: M Patient Age:   4 years Exam Location:  [facility] Procedure:      VAS [place] LOWER EXTREMITY VENOUS (DVT) Referring Phys: Marda Stalker --------------------------------------------------------------------------------  Indications: Pain.  Comparison Study: 03/08/21 prior Performing Technologist: Archie Patten RVS  Examination Guidelines: A complete evaluation includes B-mode imaging, spectral Doppler, color Doppler, and power Doppler as needed of all accessible portions of each vessel. Bilateral testing is considered an integral part of a complete examination. Limited examinations for reoccurring indications may be performed as noted. The reflux portion of the exam is performed with the patient in reverse Trendelenburg.  +-----+---------------+---------+-----------+----------+--------------+ RIGHTCompressibilityPhasicitySpontaneityPropertiesThrombus Aging +-----+---------------+---------+-----------+----------+--------------+ CFV  Full           Yes      Yes                                 +-----+---------------+---------+-----------+----------+--------------+   +---------+---------------+---------+-----------+----------+--------------+ LEFT     CompressibilityPhasicitySpontaneityPropertiesThrombus Aging +---------+---------------+---------+-----------+----------+--------------+ CFV      Full           Yes      Yes                                  +---------+---------------+---------+-----------+----------+--------------+ SFJ      Full                                                        +---------+---------------+---------+-----------+----------+--------------+ FV Prox  Full                                                        +---------+---------------+---------+-----------+----------+--------------+  FV Mid   Full                                                        +---------+---------------+---------+-----------+----------+--------------+ FV DistalFull                                                        +---------+---------------+---------+-----------+----------+--------------+ PFV      Full                                                        +---------+---------------+---------+-----------+----------+--------------+ POP      Full           Yes      Yes                                 +---------+---------------+---------+-----------+----------+--------------+ PTV      Full                                                        +---------+---------------+---------+-----------+----------+--------------+ PERO     Full                                                        +---------+---------------+---------+-----------+----------+--------------+     Summary: RIGHT: - No evidence of common femoral vein obstruction.  LEFT: - There is no evidence of deep vein thrombosis in the lower extremity.  - No cystic structure found in the popliteal fossa.  *See table(s) above for measurements and observations. Electronically signed by Deitra Mayo MD on 10/21/2021 at 4:55:40 PM.    Final    DG Chest Portable 1 View  Result Date: 10/21/2021 CLINICAL DATA:  Chest pain short of breath EXAM: PORTABLE CHEST 1 VIEW COMPARISON:  Chest 09/28/2021 FINDINGS: COPD with hyperinflation. Progression of bibasilar airspace disease. Possible pneumonia versus atelectasis  Negative for heart failure.  Small left effusion IMPRESSION: COPD with bibasilar airspace disease, possible pneumonia. Small left pleural effusion. Electronically Signed   By: Franchot Gallo M.D.   On: 10/21/2021 16:21    Microbiology: Results for orders placed or performed during the hospital encounter of 10/21/21  SARS Coronavirus 2 by RT PCR (hospital order, performed in [facility] hospital lab) *cepheid single result test* Anterior Nasal Swab     Status: None   Collection Time: 10/21/21  3:54 PM   Specimen: Anterior Nasal Swab  Result Value Ref Range Status   SARS Coronavirus 2 by RT PCR NEGATIVE NEGATIVE Final    Comment: (NOTE) SARS-CoV-2 target nucleic acids are NOT DETECTED.  The SARS-CoV-2 RNA is generally detectable in upper and  lower respiratory specimens during the acute phase of infection. The lowest concentration of SARS-CoV-2 viral copies this assay can detect is 250 copies / mL. A negative result does not preclude SARS-CoV-2 infection and should not be used as the sole basis for treatment or other patient management decisions.  A negative result may occur with improper specimen collection / handling, submission of specimen other than nasopharyngeal swab, presence of viral mutation(s) within the areas targeted by this assay, and inadequate number of viral copies (<250 copies / mL). A negative result must be combined with clinical observations, patient history, and epidemiological information.  Fact Sheet for Patients:   https://www..info/  Fact Sheet for Healthcare Providers: https://hall.com/  This test is not yet approved or  cleared by the [place] FDA and has been authorized for detection and/or diagnosis of SARS-CoV-2 by FDA under an Emergency Use Authorization (EUA).  This EUA will remain in effect (meaning this test can be used) for the duration of the COVID-19 declaration under Section 564(b)(1) of the Act,  21 U.S.C. section 360bbb-3(b)(1), unless the authorization is terminated or revoked sooner.  Performed at [facility], [place] [address], [place], [place] [address]   Expectorated Sputum Assessment w Gram Stain, Rflx to Resp Cult     Status: None   Collection Time: 10/22/21  1:56 AM   Specimen: Expectorated Sputum  Result Value Ref Range Status   Specimen Description EXPECTORATED SPUTUM  Final   Special Requests NONE  Final   Sputum evaluation   Final    Sputum specimen not acceptable for testing.  Please recollect.   NOTIFIED MANUEL CASTRO ON 10/23/21 @ 2019 BY DRT Performed at [facility], [place] [address] [place], [place] [address]    Report Status 10/23/2021 FINAL  Final  MRSA Next Gen by PCR, Nasal     Status: Abnormal   Collection Time: 10/22/21  2:05 AM   Specimen: Nasal Mucosa; Nasal Swab  Result Value Ref Range Status   MRSA by PCR Next Gen DETECTED (A) NOT DETECTED Final    Comment: CRITICAL RESULT CALLED TO, READ BACK BY AND VERIFIED WITH:  Prescott Gum, RN 10/22/21 0545 A. LAFRANCE (NOTE) The GeneXpert MRSA Assay (FDA approved for NASAL specimens only), is one component of a comprehensive MRSA colonization surveillance program. It is not intended to diagnose MRSA infection nor to guide or monitor treatment for MRSA infections. Test performance is not FDA approved in patients less than 33 years old. Performed at [facility], [place] [address] [place], [place] [address]   Blood culture (routine x 2)     Status: None   Collection Time: 10/22/21  3:13 AM   Specimen: BLOOD LEFT HAND  Result Value Ref Range Status   Specimen Description BLOOD LEFT HAND  Final   Special Requests   Final    BOTTLES DRAWN AEROBIC AND ANAEROBIC Blood Culture adequate volume   Culture   Final    NO GROWTH 5 DAYS Performed at [facility], [place] [address] [place], [place] [address]    Report Status 10/27/2021 FINAL  Final  Blood culture (routine x 2)     Status: None    Collection Time: 10/22/21  3:18 AM   Specimen: BLOOD LEFT ARM  Result Value Ref Range Status   Specimen Description BLOOD LEFT ARM  Final   Special Requests   Final    BOTTLES DRAWN AEROBIC AND ANAEROBIC Blood Culture adequate volume  Culture   Final    NO GROWTH 5 DAYS Performed at [facility], [place] [address], [place], [place] [address]    Report Status 10/27/2021 FINAL  Final   Labs: CBC: Recent Labs  Lab 10/24/21 0734 10/25/21 0948 10/29/21 1335  WBC 7.6 5.9 5.1  NEUTROABS  --  3.9  --   HGB 9.4* 9.5* 9.9*  HCT 29.5* 29.2* 31.0*  MCV 94.2 93.0 91.2  PLT 255 230 335   Basic Metabolic Panel: Recent Labs  Lab 10/24/21 0734 10/29/21 1335  NA 139 136  K 4.3 4.0  CL 97* 95*  CO2 27 26  GLUCOSE 96 143*  BUN 36* 42*  CREATININE 6.07* 4.76*  CALCIUM 8.6* 8.6*  PHOS 5.9* 4.4   Liver Function Tests: Recent Labs  Lab 10/24/21 0734 10/29/21 1335  ALBUMIN 2.4* 2.5*   CBG: No results for input(s): "GLUCAP" in the last 168 hours.  Discharge time spent: greater than 30 minutes.  Signed: Berle Mull, MD [facility]

## 2021-10-30 NOTE — Progress Notes (Signed)
D/C order noted. Contacted [facility] to advise clinic of pt's d/c today to snf and that pt will resume care tomorrow.   Melven Sartorius Renal Navigator [phone]

## 2021-10-30 NOTE — Plan of Care (Signed)
Patient ID: Wayne Meadows, male   DOB: 27-Sep-1934, 87 y.o.   MRN: [id number]  Problem: Activity: Goal: Ability to tolerate increased activity will improve Outcome: Progressing   Problem: Clinical Measurements: Goal: Ability to maintain a body temperature in the normal range will improve Outcome: Progressing   Problem: Respiratory: Goal: Ability to maintain adequate ventilation will improve Outcome: Progressing Goal: Ability to maintain a clear airway will improve Outcome: Progressing   Problem: Education: Goal: Knowledge of General Education information will improve Description: Including pain rating scale, medication(s)/side effects and non-pharmacologic comfort measures Outcome: Progressing   Problem: Health Behavior/Discharge Planning: Goal: Ability to manage health-related needs will improve Outcome: Progressing   Problem: Clinical Measurements: Goal: Ability to maintain clinical measurements within normal limits will improve Outcome: Progressing Goal: Will remain free from infection Outcome: Progressing Goal: Diagnostic test results will improve Outcome: Progressing Goal: Respiratory complications will improve Outcome: Progressing Goal: Cardiovascular complication will be avoided Outcome: Progressing   Problem: Activity: Goal: Risk for activity intolerance will decrease Outcome: Progressing   Problem: Nutrition: Goal: Adequate nutrition will be maintained Outcome: Progressing   Problem: Coping: Goal: Level of anxiety will decrease Outcome: Progressing   Problem: Elimination: Goal: Will not experience complications related to bowel motility Outcome: Progressing Goal: Will not experience complications related to urinary retention Outcome: Progressing   Problem: Pain Managment: Goal: General experience of comfort will improve Outcome: Progressing   Problem: Safety: Goal: Ability to remain free from injury will improve Outcome: Progressing   Problem:  Skin Integrity: Goal: Risk for impaired skin integrity will decrease Outcome: Progressing   Problem: Education: Goal: Knowledge of disease and its progression will improve Outcome: Progressing Goal: Individualized Educational Video(s) Outcome: Progressing   Problem: Fluid Volume: Goal: Compliance with measures to maintain balanced fluid volume will improve Outcome: Progressing   Problem: Health Behavior/Discharge Planning: Goal: Ability to manage health-related needs will improve Outcome: Progressing   Problem: Nutritional: Goal: Ability to make healthy dietary choices will improve Outcome: Progressing   Problem: Clinical Measurements: Goal: Complications related to the disease process, condition or treatment will be avoided or minimized Outcome: Progressing   Problem: Activity: Goal: Ability to tolerate increased activity will improve Outcome: Progressing   Problem: Clinical Measurements: Goal: Ability to maintain a body temperature in the normal range will improve Outcome: Progressing   Problem: Respiratory: Goal: Ability to maintain adequate ventilation will improve Outcome: Progressing Goal: Ability to maintain a clear airway will improve Outcome: Progressing  Haydee Salter, RN

## 2021-10-30 NOTE — Progress Notes (Signed)
Patient refused ambulation r/t pain level of 3. PRN medication has already been administered. Patient stated at this time he does not feel up to the ambulation in the hallway.

## 2021-10-30 NOTE — Progress Notes (Signed)
[facility] Progress Note   Subjective:  Seen in room. Hospitalist in room as well - plan is to try to ambulate with O2 4L/min to assure sats stay ok in which case he would be able to discharge today. Denies CP. Says mucus is too thick to cough up. Dialysis went fine yesterday - 3L net UF off.  Objective Vitals:   10/30/21 0408 10/30/21 0600 10/30/21 0800 10/30/21 1030  BP:   (!) 139/57 (!) 143/54  Pulse:   72 72  Resp:   20   Temp:   98.7 F (37.1 C)   TempSrc:   Oral   SpO2:  (!) 35% 93%   Weight: 72.9 kg     Height:       Physical Exam General: Elderly man, NAD. Nasal O2 in place Heart: RRR; no murmur Lungs: Diffuse wheezing throughout all lung fields, dull L base Abdomen: soft Extremities: No LE edema Dialysis Access:  R AVF + thrill  Additional Objective Labs: Basic Metabolic Panel: Recent Labs  Lab 10/24/21 0734 10/29/21 1335  NA 139 136  K 4.3 4.0  CL 97* 95*  CO2 27 26  GLUCOSE 96 143*  BUN 36* 42*  CREATININE 6.07* 4.76*  CALCIUM 8.6* 8.6*  PHOS 5.9* 4.4   Liver Function Tests: Recent Labs  Lab 10/24/21 0734 10/29/21 1335  ALBUMIN 2.4* 2.5*   CBC: Recent Labs  Lab 10/24/21 0734 10/25/21 0948 10/29/21 1335  WBC 7.6 5.9 5.1  NEUTROABS  --  3.9  --   HGB 9.4* 9.5* 9.9*  HCT 29.5* 29.2* 31.0*  MCV 94.2 93.0 91.2  PLT 255 230 275   Studies/Results: DG CHEST PORT 1 VIEW  Result Date: 10/29/2021 CLINICAL DATA:  Short of breath. Pleural effusion. Follow-up study. EXAM: PORTABLE CHEST 1 VIEW COMPARISON:  10/26/2021 and older exams.  CT, 10/21/2021. FINDINGS: Bilateral lung base opacities obscure the hemidiaphragms consistent with a combination of small to moderate effusions and atelectasis. Perihilar so opacities noted on the previous day's study have improved, which may be due to more wrecked positioning on the current exam. No convincing pulmonary edema. Underlying changes of COPD/emphysema, stable. No pneumothorax. IMPRESSION: 1. No  convincing change from the most recent prior exam. 2. Small to moderate bilateral effusions with associated lung base opacities, likely atelectasis. Consider a component of pneumonia if there are consistent clinical findings. 3. Underlying COPD/emphysema.  No convincing pulmonary edema. Electronically Signed   By: Lajean Manes M.D.   On: 10/29/2021 10:53    Medications:   (feeding supplement) PROSource Plus  30 mL Oral BID BM   albuterol  2.5 mg Nebulization QID   aspirin EC  81 mg Oral Daily   benzonatate  100 mg Oral TID   busPIRone  5 mg Oral BID   darbepoetin (ARANESP) injection - DIALYSIS  100 mcg Intravenous Q Wed-HD   dextromethorphan-guaiFENesin  1 tablet Oral BID   diltiazem  240 mg Oral Daily   doxycycline  100 mg Oral Q12H   feeding supplement (NEPRO CARB STEADY)  237 mL Oral BID BM   guaiFENesin  10 mL Oral TID   heparin  5,000 Units Subcutaneous Q8H   levothyroxine  50 mcg Oral QAC breakfast   lidocaine  1 patch Transdermal Daily   melatonin  3 mg Oral QHS   metoprolol tartrate  12.5 mg Oral BID   multivitamin  1 tablet Oral QHS   naphazoline-glycerin  1-2 drop Right Eye TID   neomycin-bacitracin-polymyxin  1 Application Topical TID   mouth rinse  15 mL Mouth Rinse 4 times per day   pravastatin  40 mg Oral QHS   predniSONE  40 mg Oral Q breakfast   senna  2 tablet Oral QHS   sodium chloride flush  3 mL Intravenous Q12H   tamsulosin  0.4 mg Oral Daily   Dialysis Orders: MWF [facility] 4h  300/500  77 kg  3K/2.5Ca bath  Heparin 3000 units IV, RUE AVF - mircera 75 mcg IVP q2 weeks- last dose 10/15/21 - iron sucrose '50mg'$  q wk   Assessment/Plan: Aspiration pneumonia: Initially on Unasyn -> then Vancomycin -> now doxycycline. WBC improving. Acute on chronic espiratory failure/COPD:  Baseline O2 2.5L/min, still requiring higher dose O2 here. CXR 9/3 with bibasilar effusions. On prednisone taper. ESRD: Continue HD per usual MWF schedule - for HD tomorrow. HTN/volume: BP decent,  UF as tolerated. Below current OP EDW - challenge further and will lower on discharge.  Anemia of ESRD: Hgb 9.9 - continue Aranesp weekly. Secondary HPTH: Ca ok, Phos better, binders not being given here. Nutrition: Alb low, continue supplements. CAD Spinal compression Fx (T5/T7): Pain control per primary.  Veneta Penton, PA-C 10/30/2021, 11:05 AM  [employer]

## 2021-10-30 NOTE — Consult Note (Signed)
   [facility] Inpatient Consult   10/30/2021  ACEL NATZKE 1935-01-27 [id number]  [employer] [ACO] Patient: Medicare   *Reviewed for high risk and LOS  Primary Care Provider:  Cl[facility], [name]  If the patient goes to a [facility] affiliated facility then, patient can be followed by [facility] PAC RN with traditional Medicare and approved Medicare Advantage plans. Patient is [place]   Plan:   [facility] PAC RN can fbe notified as returning to [facility], LTC.  For questions or referrals, please contact:   Natividad Brood, RN BSN [facility] Liaison  [phone] business mobile phone Toll free office [phone]  Fax number: [phone] [email or web address]

## 2021-10-30 NOTE — Plan of Care (Signed)
Pt alert and oriented x 4. Pt received 1 dose of dilaudid and 1 dose of norco this shift for abdomen and rib pain. Abdomen rounded and distended passing gas. Bsx4. Pt took senna at hs as prescribed. MD orders to wean oxygen to 3L. Weaned to 3L pt requested oxygen to be increased 3.5 at approx 0400. At 0600 entered room sats 97% on 3.5 L decreased oxygen to 3L and sating 95%.  Problem: Activity: Goal: Ability to tolerate increased activity will improve Outcome: Progressing   Problem: Clinical Measurements: Goal: Ability to maintain a body temperature in the normal range will improve Outcome: Progressing   Problem: Respiratory: Goal: Ability to maintain adequate ventilation will improve Outcome: Progressing Goal: Ability to maintain a clear airway will improve Outcome: Progressing   Problem: Education: Goal: Knowledge of General Education information will improve Description: Including pain rating scale, medication(s)/side effects and non-pharmacologic comfort measures Outcome: Progressing   Problem: Health Behavior/Discharge Planning: Goal: Ability to manage health-related needs will improve Outcome: Progressing   Problem: Clinical Measurements: Goal: Ability to maintain clinical measurements within normal limits will improve Outcome: Progressing Goal: Will remain free from infection Outcome: Progressing Goal: Diagnostic test results will improve Outcome: Progressing Goal: Respiratory complications will improve Outcome: Progressing Goal: Cardiovascular complication will be avoided Outcome: Progressing   Problem: Activity: Goal: Risk for activity intolerance will decrease Outcome: Progressing   Problem: Nutrition: Goal: Adequate nutrition will be maintained Outcome: Progressing   Problem: Coping: Goal: Level of anxiety will decrease Outcome: Progressing   Problem: Elimination: Goal: Will not experience complications related to bowel motility Outcome: Progressing Goal:  Will not experience complications related to urinary retention Outcome: Progressing   Problem: Pain Managment: Goal: General experience of comfort will improve Outcome: Progressing   Problem: Safety: Goal: Ability to remain free from injury will improve Outcome: Progressing   Problem: Skin Integrity: Goal: Risk for impaired skin integrity will decrease Outcome: Progressing   Problem: Education: Goal: Knowledge of disease and its progression will improve Outcome: Progressing Goal: Individualized Educational Video(s) Outcome: Progressing   Problem: Fluid Volume: Goal: Compliance with measures to maintain balanced fluid volume will improve Outcome: Progressing   Problem: Health Behavior/Discharge Planning: Goal: Ability to manage health-related needs will improve Outcome: Progressing   Problem: Nutritional: Goal: Ability to make healthy dietary choices will improve Outcome: Progressing   Problem: Clinical Measurements: Goal: Complications related to the disease process, condition or treatment will be avoided or minimized Outcome: Progressing   Problem: Activity: Goal: Ability to tolerate increased activity will improve Outcome: Progressing   Problem: Clinical Measurements: Goal: Ability to maintain a body temperature in the normal range will improve Outcome: Progressing   Problem: Respiratory: Goal: Ability to maintain adequate ventilation will improve Outcome: Progressing Goal: Ability to maintain a clear airway will improve Outcome: Progressing

## 2021-10-31 DIAGNOSIS — N186 End stage renal disease: Secondary | ICD-10-CM | POA: Diagnosis not present

## 2021-10-31 DIAGNOSIS — J9611 Chronic respiratory failure with hypoxia: Secondary | ICD-10-CM | POA: Diagnosis not present

## 2021-10-31 DIAGNOSIS — J690 Pneumonitis due to inhalation of food and vomit: Secondary | ICD-10-CM | POA: Diagnosis not present

## 2021-10-31 DIAGNOSIS — M6281 Muscle weakness (generalized): Secondary | ICD-10-CM | POA: Diagnosis not present

## 2021-10-31 DIAGNOSIS — R278 Other lack of coordination: Secondary | ICD-10-CM | POA: Diagnosis not present

## 2021-10-31 DIAGNOSIS — S2243XS Multiple fractures of ribs, bilateral, sequela: Secondary | ICD-10-CM | POA: Diagnosis not present

## 2021-10-31 DIAGNOSIS — J449 Chronic obstructive pulmonary disease, unspecified: Secondary | ICD-10-CM | POA: Diagnosis not present

## 2021-10-31 DIAGNOSIS — R293 Abnormal posture: Secondary | ICD-10-CM | POA: Diagnosis not present

## 2021-10-31 DIAGNOSIS — R2681 Unsteadiness on feet: Secondary | ICD-10-CM | POA: Diagnosis not present

## 2021-10-31 DIAGNOSIS — S22060S Wedge compression fracture of T7-T8 vertebra, sequela: Secondary | ICD-10-CM | POA: Diagnosis not present

## 2021-10-31 DIAGNOSIS — U071 COVID-19: Secondary | ICD-10-CM | POA: Diagnosis not present

## 2021-10-31 DIAGNOSIS — R262 Difficulty in walking, not elsewhere classified: Secondary | ICD-10-CM | POA: Diagnosis not present

## 2021-11-03 DIAGNOSIS — N186 End stage renal disease: Secondary | ICD-10-CM | POA: Diagnosis not present

## 2021-11-03 DIAGNOSIS — J449 Chronic obstructive pulmonary disease, unspecified: Secondary | ICD-10-CM | POA: Diagnosis not present

## 2021-11-03 DIAGNOSIS — S2243XS Multiple fractures of ribs, bilateral, sequela: Secondary | ICD-10-CM | POA: Diagnosis not present

## 2021-11-03 DIAGNOSIS — J690 Pneumonitis due to inhalation of food and vomit: Secondary | ICD-10-CM | POA: Diagnosis not present

## 2021-11-03 DIAGNOSIS — S22060S Wedge compression fracture of T7-T8 vertebra, sequela: Secondary | ICD-10-CM | POA: Diagnosis not present

## 2021-11-03 DIAGNOSIS — J9611 Chronic respiratory failure with hypoxia: Secondary | ICD-10-CM | POA: Diagnosis not present

## 2021-11-04 DIAGNOSIS — N186 End stage renal disease: Secondary | ICD-10-CM | POA: Diagnosis not present

## 2021-11-04 DIAGNOSIS — J449 Chronic obstructive pulmonary disease, unspecified: Secondary | ICD-10-CM | POA: Diagnosis not present

## 2021-11-04 DIAGNOSIS — J690 Pneumonitis due to inhalation of food and vomit: Secondary | ICD-10-CM | POA: Diagnosis not present

## 2021-11-04 DIAGNOSIS — S2243XS Multiple fractures of ribs, bilateral, sequela: Secondary | ICD-10-CM | POA: Diagnosis not present

## 2021-11-04 DIAGNOSIS — J9611 Chronic respiratory failure with hypoxia: Secondary | ICD-10-CM | POA: Diagnosis not present

## 2021-11-04 DIAGNOSIS — S22060S Wedge compression fracture of T7-T8 vertebra, sequela: Secondary | ICD-10-CM | POA: Diagnosis not present

## 2021-11-05 ENCOUNTER — Encounter (HOSPITAL_COMMUNITY): Payer: Self-pay | Admitting: Internal Medicine

## 2021-11-05 ENCOUNTER — Observation Stay (HOSPITAL_COMMUNITY)
Admission: EM | Admit: 2021-11-05 | Discharge: 2021-11-06 | Payer: No Typology Code available for payment source | Attending: Student | Admitting: Student

## 2021-11-05 DIAGNOSIS — Z87891 Personal history of nicotine dependence: Secondary | ICD-10-CM | POA: Insufficient documentation

## 2021-11-05 DIAGNOSIS — J441 Chronic obstructive pulmonary disease with (acute) exacerbation: Secondary | ICD-10-CM | POA: Diagnosis present

## 2021-11-05 DIAGNOSIS — J449 Chronic obstructive pulmonary disease, unspecified: Secondary | ICD-10-CM | POA: Insufficient documentation

## 2021-11-05 DIAGNOSIS — K2210 Ulcer of esophagus without bleeding: Secondary | ICD-10-CM

## 2021-11-05 DIAGNOSIS — Z593 Problems related to living in residential institution: Secondary | ICD-10-CM

## 2021-11-05 DIAGNOSIS — I480 Paroxysmal atrial fibrillation: Secondary | ICD-10-CM | POA: Diagnosis not present

## 2021-11-05 DIAGNOSIS — K449 Diaphragmatic hernia without obstruction or gangrene: Secondary | ICD-10-CM | POA: Diagnosis not present

## 2021-11-05 DIAGNOSIS — Z79899 Other long term (current) drug therapy: Secondary | ICD-10-CM | POA: Insufficient documentation

## 2021-11-05 DIAGNOSIS — U071 COVID-19: Secondary | ICD-10-CM | POA: Diagnosis not present

## 2021-11-05 DIAGNOSIS — K2970 Gastritis, unspecified, without bleeding: Secondary | ICD-10-CM | POA: Diagnosis not present

## 2021-11-05 DIAGNOSIS — Z9981 Dependence on supplemental oxygen: Secondary | ICD-10-CM | POA: Insufficient documentation

## 2021-11-05 DIAGNOSIS — Z7982 Long term (current) use of aspirin: Secondary | ICD-10-CM | POA: Insufficient documentation

## 2021-11-05 DIAGNOSIS — J9611 Chronic respiratory failure with hypoxia: Secondary | ICD-10-CM | POA: Diagnosis present

## 2021-11-05 DIAGNOSIS — Z8673 Personal history of transient ischemic attack (TIA), and cerebral infarction without residual deficits: Secondary | ICD-10-CM | POA: Diagnosis not present

## 2021-11-05 DIAGNOSIS — E039 Hypothyroidism, unspecified: Secondary | ICD-10-CM | POA: Diagnosis not present

## 2021-11-05 DIAGNOSIS — Z85528 Personal history of other malignant neoplasm of kidney: Secondary | ICD-10-CM | POA: Insufficient documentation

## 2021-11-05 DIAGNOSIS — K2211 Ulcer of esophagus with bleeding: Secondary | ICD-10-CM | POA: Diagnosis not present

## 2021-11-05 DIAGNOSIS — I10 Essential (primary) hypertension: Secondary | ICD-10-CM | POA: Diagnosis present

## 2021-11-05 DIAGNOSIS — N186 End stage renal disease: Secondary | ICD-10-CM | POA: Insufficient documentation

## 2021-11-05 DIAGNOSIS — Z992 Dependence on renal dialysis: Secondary | ICD-10-CM

## 2021-11-05 DIAGNOSIS — K920 Hematemesis: Secondary | ICD-10-CM | POA: Diagnosis present

## 2021-11-05 DIAGNOSIS — K922 Gastrointestinal hemorrhage, unspecified: Secondary | ICD-10-CM | POA: Diagnosis not present

## 2021-11-05 DIAGNOSIS — Z789 Other specified health status: Secondary | ICD-10-CM

## 2021-11-05 DIAGNOSIS — I2510 Atherosclerotic heart disease of native coronary artery without angina pectoris: Secondary | ICD-10-CM | POA: Diagnosis not present

## 2021-11-05 DIAGNOSIS — I120 Hypertensive chronic kidney disease with stage 5 chronic kidney disease or end stage renal disease: Secondary | ICD-10-CM | POA: Diagnosis not present

## 2021-11-05 LAB — CBC WITH DIFFERENTIAL/PLATELET
Abs Immature Granulocytes: 0.16 10*3/uL — ABNORMAL HIGH (ref 0.00–0.07)
Basophils Absolute: 0 10*3/uL (ref 0.0–0.1)
Basophils Relative: 0 %
Eosinophils Absolute: 0 10*3/uL (ref 0.0–0.5)
Eosinophils Relative: 0 %
HCT: 38.6 % — ABNORMAL LOW (ref 39.0–52.0)
Hemoglobin: 12.3 g/dL — ABNORMAL LOW (ref 13.0–17.0)
Immature Granulocytes: 1 %
Lymphocytes Relative: 3 %
Lymphs Abs: 0.6 10*3/uL — ABNORMAL LOW (ref 0.7–4.0)
MCH: 30.8 pg (ref 26.0–34.0)
MCHC: 31.9 g/dL (ref 30.0–36.0)
MCV: 96.5 fL (ref 80.0–100.0)
Monocytes Absolute: 0.7 10*3/uL (ref 0.1–1.0)
Monocytes Relative: 4 %
Neutro Abs: 16.4 10*3/uL — ABNORMAL HIGH (ref 1.7–7.7)
Neutrophils Relative %: 92 %
Platelets: 422 10*3/uL — ABNORMAL HIGH (ref 150–400)
RBC: 4 MIL/uL — ABNORMAL LOW (ref 4.22–5.81)
RDW: 19 % — ABNORMAL HIGH (ref 11.5–15.5)
WBC: 17.9 10*3/uL — ABNORMAL HIGH (ref 4.0–10.5)
nRBC: 0 % (ref 0.0–0.2)

## 2021-11-05 LAB — TYPE AND SCREEN
ABO/RH(D): O POS
Antibody Screen: NEGATIVE

## 2021-11-05 LAB — COMPREHENSIVE METABOLIC PANEL
ALT: 21 U/L (ref 0–44)
AST: 21 U/L (ref 15–41)
Albumin: 3.2 g/dL — ABNORMAL LOW (ref 3.5–5.0)
Alkaline Phosphatase: 44 U/L (ref 38–126)
Anion gap: 14 (ref 5–15)
BUN: 16 mg/dL (ref 8–23)
CO2: 26 mmol/L (ref 22–32)
Calcium: 8.9 mg/dL (ref 8.9–10.3)
Chloride: 97 mmol/L — ABNORMAL LOW (ref 98–111)
Creatinine, Ser: 3 mg/dL — ABNORMAL HIGH (ref 0.61–1.24)
GFR, Estimated: 20 mL/min — ABNORMAL LOW (ref >60)
Glucose, Bld: 123 mg/dL — ABNORMAL HIGH (ref 70–99)
Potassium: 4.6 mmol/L (ref 3.5–5.1)
Sodium: 137 mmol/L (ref 135–145)
Total Bilirubin: 1.1 mg/dL (ref 0.3–1.2)
Total Protein: 7.1 g/dL (ref 6.5–8.1)

## 2021-11-05 LAB — RESP PANEL BY RT-PCR (FLU A&B, COVID) ARPGX2
Influenza A by PCR: NEGATIVE
Influenza B by PCR: NEGATIVE
SARS Coronavirus 2 by RT PCR: POSITIVE — AB

## 2021-11-05 LAB — PROTIME-INR
INR: 1.2 (ref 0.8–1.2)
Prothrombin Time: 14.6 seconds (ref 11.4–15.2)

## 2021-11-05 MED ORDER — METOPROLOL TARTRATE 12.5 MG HALF TABLET
12.5000 mg | ORAL_TABLET | Freq: Two times a day (BID) | ORAL | Status: DC
  Administered 2021-11-05 – 2021-11-06 (×2): 12.5 mg via ORAL
  Filled 2021-11-05 (×2): qty 1

## 2021-11-05 MED ORDER — ACETAMINOPHEN 325 MG PO TABS
650.0000 mg | ORAL_TABLET | Freq: Four times a day (QID) | ORAL | Status: DC | PRN
  Administered 2021-11-05: 650 mg via ORAL
  Filled 2021-11-05: qty 2

## 2021-11-05 MED ORDER — ONDANSETRON HCL 4 MG/2ML IJ SOLN: 4.0000 mg | Freq: Once | INTRAMUSCULAR | Status: DC

## 2021-11-05 MED ORDER — PANTOPRAZOLE SODIUM 40 MG IV SOLR
40.0000 mg | Freq: Once | INTRAVENOUS | Status: AC
  Administered 2021-11-05: 40 mg via INTRAVENOUS
  Filled 2021-11-05: qty 10

## 2021-11-05 MED ORDER — ONDANSETRON HCL 4 MG/2ML IJ SOLN
4.0000 mg | Freq: Once | INTRAMUSCULAR | Status: AC
  Administered 2021-11-05: 4 mg via INTRAVENOUS
  Filled 2021-11-05: qty 2

## 2021-11-05 MED ORDER — BUSPIRONE HCL 5 MG PO TABS
5.0000 mg | ORAL_TABLET | Freq: Three times a day (TID) | ORAL | Status: DC
  Administered 2021-11-05 – 2021-11-06 (×2): 5 mg via ORAL
  Filled 2021-11-05 (×2): qty 1

## 2021-11-05 MED ORDER — DILTIAZEM HCL ER COATED BEADS 240 MG PO CP24
240.0000 mg | ORAL_CAPSULE | Freq: Every day | ORAL | Status: DC
  Administered 2021-11-06: 240 mg via ORAL
  Filled 2021-11-05: qty 1

## 2021-11-05 MED ORDER — PANTOPRAZOLE INFUSION (NEW) - SIMPLE MED
8.0000 mg/h | INTRAVENOUS | Status: DC
  Administered 2021-11-05 – 2021-11-06 (×2): 8 mg/h via INTRAVENOUS
  Filled 2021-11-05 (×2): qty 80
  Filled 2021-11-05: qty 100

## 2021-11-05 MED ORDER — ONDANSETRON HCL 4 MG/2ML IJ SOLN: 4.0000 mg | Freq: Four times a day (QID) | INTRAMUSCULAR | Status: DC | PRN

## 2021-11-05 MED ORDER — IPRATROPIUM-ALBUTEROL 20-100 MCG/ACT IN AERS
1.0000 | INHALATION_SPRAY | Freq: Four times a day (QID) | RESPIRATORY_TRACT | Status: DC
  Administered 2021-11-05 – 2021-11-06 (×3): 1 via RESPIRATORY_TRACT
  Filled 2021-11-05: qty 4

## 2021-11-05 MED ORDER — ACETAMINOPHEN 650 MG RE SUPP: 650.0000 mg | Freq: Four times a day (QID) | RECTAL | Status: DC | PRN

## 2021-11-05 MED ORDER — LEVOTHYROXINE SODIUM 50 MCG PO TABS
50.0000 ug | ORAL_TABLET | Freq: Every day | ORAL | Status: DC
  Administered 2021-11-06: 50 ug via ORAL
  Filled 2021-11-05: qty 1

## 2021-11-05 MED ORDER — SODIUM CHLORIDE 0.9 % IV SOLN
8.0000 mg | Freq: Once | INTRAVENOUS | Status: DC
  Filled 2021-11-05: qty 4

## 2021-11-05 MED ORDER — LIDOCAINE VISCOUS HCL 2 % MT SOLN: 15.0000 mL | Freq: Once | OROMUCOSAL | Status: DC

## 2021-11-05 MED ORDER — ONDANSETRON HCL 4 MG PO TABS: 4.0000 mg | ORAL_TABLET | Freq: Four times a day (QID) | ORAL | Status: DC | PRN

## 2021-11-05 NOTE — Assessment & Plan Note (Signed)
Stable. Continue supplemental O2 @ 2.5 L/min

## 2021-11-05 NOTE — Assessment & Plan Note (Signed)
Stable. Continue synthroid 50 mcg daily

## 2021-11-05 NOTE — ED Provider Notes (Signed)
[facility] EMERGENCY DEPARTMENT Provider Note  CSN: [id number] Arrival date & time: 11/05/21 1747  Chief Complaint(s) Hematemesis  HPI Wayne Meadows is a 87 y.o. male with PMH paroxysmal A-fib not on anticoagulation likely secondary to history of GI bleeding, CAD, hypothyroidism, COPD, chronic respiratory failure on 4 L CKD on hemodialysis Monday Wednesday Friday who presents emergency department for evaluation of hematemesis.  Patient just recently discharged in the hospital on 10/30/2021 for aspiration pneumonia and pain control from a compression fracture.  Patient states that he he recently tested positive for COVID-19 and completed his full dialysis session today, but immediately afterwards, had multiple episodes of hematemesis with bright red blood.  Patient does have a history of peptic ulcer disease diagnosed by inpatient EGD in February 2023.  Patient endorses persistent nausea, epigastric pain and abdominal bloating but denies chest pain, shortness of breath, headache, fever or other systemic symptoms   Past Medical History Past Medical History:  Diagnosis Date   Abnormal echocardiogram 03/08/2021   Abnormal finding on GI tract imaging    Acute blood loss anemia    Acute pancreatitis 08/12/2020   Arthritis    Carotid stenosis    Community acquired pneumonia 04/06/2021   COPD (chronic obstructive pulmonary disease) ([place])    Coronary artery disease    S/p PCI 2011;  NSTEMI 12/12:  LHC/PCI 02/23/11: LAD 60% after the septal perforator, D1 occluded with distal collaterals, proximal RI 30-40%, AV circumflex stent patent with 60% stenosis after the stent, RCA 99%, EF 60-65%.  His RCA was treated with a bare-metal stent   CVA (cerebral infarction) 2011   Right cerebral; total obstruction of the right ICA   Diverticulitis    Hypertension    Paroxysmal atrial fibrillation with RVR ([place]) 04/09/2021   Pleuritic chest pain 04/08/2014   Pressure injury of skin 03/30/2021    Rash 03/05/2021   Rectal bleeding 07/2015   Refractory nausea and vomiting 06/20/2020   Renal carcinoma (HCC)    Sepsis, unspecified organism ([place]) 04/11/2016   Stroke ([place])    Tobacco abuse, in remission    Patient Active Problem List   Diagnosis Date Noted   Thoracic compression fracture, sequela 10/30/2021   Multiple fractures of ribs, bilateral, initial encounter for closed fracture 10/30/2021   Back pain    Aspiration pneumonia ([place]) 10/21/2021   Atrial fibrillation ([place]) 09/29/2021   Chronic respiratory failure with hypoxia ([place]) 09/28/2021   Paroxysmal atrial flutter ([place]) 09/28/2021   Hyperkalemia 09/28/2021   ESRD (end stage renal disease) ([place]) 09/11/2021   Partial small bowel obstruction ([place]) 08/17/2021   Right lower lobe pneumonia 08/13/2021   Acute on chronic respiratory failure with hypoxia ([place]) 08/13/2021   Arteriovenous fistula infection ([place]) 04/18/2021   Gastric ulcer without hemorrhage or perforation    Duodenal ulcer    Elevated INR    Acute upper GI bleed 04/06/2021   ESRD (end stage renal disease) on MWF dialysis ([place]) 04/06/2021   Supratherapeutic INR 04/06/2021   Proteinuria 03/07/2021   AKI (acute kidney injury) ([place]) 03/05/2021   Hypothyroidism 03/05/2021   Anxiety 03/05/2021   Recurrent cellulitis of lower leg 03/04/2021   Chronic pancreatitis ([place]) 12/24/2020   Nausea and vomiting in adult 06/22/2020   Esophageal stricture    Hyperbilirubinemia 04/11/2016   Elevated lactic acid level    Segmental colitis ([place]) 09/04/2015   Noninfectious gastroenteritis, unspecified    Benign neoplasm of transverse colon    Benign neoplasm of colon  Bright red blood per rectum    Dysphagia    Renal cell cancer (HCC)    Hypertension    Centrilobular emphysema ([place])    Blood in stool 08/19/2015   Rectal bleeding 08/19/2015   CAP (community acquired pneumonia) 04/08/2014   Overweight (BMI 25.0-29.9) 04/08/2014   Healthcare-associated pneumonia 10/26/2011    Cholelithiasis 04/26/2011   Paroxysmal atrial fibrillation ([place]) 04/26/2011   Chest pain 03/19/2011   Unstable angina ([place]) 03/18/2011    Class: Acute   Lung nodule 03/02/2011   Liver lesion 03/02/2011   Other hyperlipidemia 03/02/2011   Abnormal CT of the abdomen 02/23/2011   Non Q wave myocardial infarction ([place]) 02/22/2011    Class: Acute   History of CVA (cerebrovascular accident) 02/22/2011   CAD (coronary artery disease) 02/22/2011   COPD (chronic obstructive pulmonary disease) ([place]) 02/22/2011   Tobacco abuse, 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/22/2011   Home Medication(s) Prior to Admission medications   Medication Sig Start Date End Date Taking? Authorizing Provider  acetaminophen (TYLENOL) 325 MG tablet Take 2 tablets (650 mg total) by mouth every 6 (six) hours as needed for mild pain (or Fever >/= 101). Patient not taking: Reported on 10/21/2021 04/10/21   Bonnielee Haff, MD  Amino Acids-Protein Hydrolys (PRO-STAT SUGAR FREE PO) Take 30 mLs by mouth every evening.    [provider]  aspirin EC 81 MG tablet Take 1 tablet (81 mg total) by mouth daily. Swallow whole. 10/31/21   Lavina Hamman, MD  benzonatate (TESSALON) 100 MG capsule Take 1 capsule (100 mg total) by mouth 3 (three) times daily. 10/30/21   Lavina Hamman, MD  busPIRone (BUSPAR) 5 MG tablet Take 1 tablet (5 mg total) by mouth 3 (three) times daily. (0800 & 2000) 10/30/21   Lavina Hamman, MD  Darbepoetin Alfa (ARANESP) 60 MCG/0.3ML SOSY injection Inject 0.3 mLs (60 mcg total) into the vein every Monday with hemodialysis. 09/30/21   Cherene Altes, MD  dextromethorphan-guaiFENesin (MUCINEX DM) 30-600 MG 12hr tablet Take 1 tablet by mouth 2 (two) times daily. 10/30/21   Lavina Hamman, MD  diltiazem (CARDIZEM CD) 240 MG 24 hr capsule Take 1 capsule (240 mg total) by mouth daily. 10/30/21   Lavina Hamman, MD  guaiFENesin (ROBITUSSIN) 100 MG/5ML liquid Take 10 mLs by mouth every 4 (four) hours as needed for cough or to loosen  phlegm. 10/30/21   Lavina Hamman, MD  HYDROcodone-acetaminophen (NORCO) 10-325 MG tablet Take 1 tablet by mouth every 4 (four) hours as needed for severe pain or moderate pain. 10/30/21   Lavina Hamman, MD  Ipratropium-Albuterol (COMBIVENT) 20-100 MCG/ACT AERS respimat Inhale 1 puff into the lungs 4 (four) times daily. (0900, 1300, 1700 & 2100)    [provider]  ipratropium-albuterol (DUONEB) 0.5-2.5 (3) MG/3ML SOLN Take 3 mLs by nebulization every 4 (four) hours as needed (shortness of breath/wheezing).    [provider]  levothyroxine (SYNTHROID) 50 MCG tablet Take 50 mcg by mouth daily before breakfast. (0630)    [provider]  LIDOCAINE PAIN RELIEF 4 % Place 1 patch onto the skin daily. Apply 1 patch to back in the morning and remove at bedtime for pain. 10/17/21   [provider]  melatonin 5 MG TABS Take 5 mg by mouth at bedtime. 09/30/21   [provider]  Menthol, Topical Analgesic, (BIOFREEZE) 4 % GEL Apply 1 Application topically 3 (three) times daily as needed (pain). Patient not taking: Reported on 10/21/2021  [provider]  metoprolol tartrate (LOPRESSOR) 25 MG tablet Take 0.5 tablets (12.5 mg total) by mouth 2 (two) times daily. 10/30/21   Lavina Hamman, MD  multivitamin (RENA-VIT) TABS tablet Take 1 tablet by mouth at bedtime. 10/30/21   Lavina Hamman, MD  naphazoline-glycerin (CLEAR EYES REDNESS) 0.012-0.25 % SOLN Place 1-2 drops into the right eye 3 (three) times daily. 04/23/21   Alma Friendly, MD  neomycin-bacitracin-polymyxin (NEOSPORIN) OINT Apply 1 application topically 3 (three) times daily. To right antecubital area 04/10/21   Bonnielee Haff, MD  Nutritional Supplements (NOVASOURCE RENAL) LIQD Take 237 mLs by mouth in the morning and at bedtime. (0800 & 1700)    [provider]  pantoprazole (PROTONIX) 40 MG tablet Take 1 tablet (40 mg total) by mouth daily for 14 days. 10/30/21 11/13/21  Lavina Hamman, MD   polyethylene glycol (MIRALAX / GLYCOLAX) 17 g packet Take 17 g by mouth daily. Patient not taking: Reported on 10/21/2021 05/01/21   Sherwood Gambler, MD  pravastatin (PRAVACHOL) 40 MG tablet Take 40 mg by mouth at bedtime. (2100)    [provider]  predniSONE (DELTASONE) 10 MG tablet Take '40mg'$  daily for 3days,Take '30mg'$  daily for 3days,Take '20mg'$  daily for 3days,Take '10mg'$  daily for 3days, then stop 10/31/21   Lavina Hamman, MD  senna (SENOKOT) 8.6 MG tablet Take 2 tablets by mouth at bedtime. 10/15/21   [provider]  tamsulosin (FLOMAX) 0.4 MG CAPS capsule Take 1 capsule (0.4 mg total) by mouth daily. 04/11/21   Bonnielee Haff, MD                                                                                                                                    Past Surgical History Past Surgical History:  Procedure Laterality Date   AV FISTULA PLACEMENT Right 04/03/2021   Procedure: ARTERIOVENOUS (AV) CREATION OF RIGHT ARM BRACHIOCEPHALIC FISTULA;  Surgeon: Cherre Robins, MD;  Location: [place];  Service: Vascular;  Laterality: Right;   BIOPSY  04/08/2021   Procedure: BIOPSY;  Surgeon: Lavena Bullion, DO;  Location: [place];  Service: Gastroenterology;;   COLON SURGERY     COLONOSCOPY N/A 08/22/2015   Procedure: COLONOSCOPY;  Surgeon: [name], MD;  Location: [place] [facility];  Service: Endoscopy;  Laterality: N/A;   ESOPHAGOGASTRODUODENOSCOPY N/A 07/18/2020   Procedure: ESOPHAGOGASTRODUODENOSCOPY (EGD);  Surgeon: Milus Banister, MD;  Location: [facility];  Service: Endoscopy;  Laterality: N/A;   ESOPHAGOGASTRODUODENOSCOPY (EGD) WITH PROPOFOL N/A 06/21/2020   Procedure: ESOPHAGOGASTRODUODENOSCOPY (EGD) WITH PROPOFOL;  Surgeon: Irene Shipper, MD;  Location[facility] ENDOSCOPY;  Service: Endoscopy;  Laterality: N/A;   ESOPHAGOGASTRODUODENOSCOPY (EGD) WITH PROPOFOL N/A 04/08/2021   Procedure: ESOPHAGOGASTRODUODENOSCOPY (EGD) WITH PROPOFOL;  Surgeon: Lavena Bullion, DO;   Location: [place];  Service: Gastroenterology;  Laterality: N/A;   EUS N/A 07/18/2020   Procedure: UPPER ENDOSCOPIC ULTRASOUND (EUS) RADIAL;  Surgeon:  Milus Banister, MD;  Location: [facility];  Service: Endoscopy;  Laterality: N/A;   FINE NEEDLE ASPIRATION N/A 07/18/2020   Procedure: FINE NEEDLE ASPIRATION (FNA) LINEAR;  Surgeon: Milus Banister, MD;  Location: [facility];  Service: Endoscopy;  Laterality: N/A;   HEMOSTASIS CLIP PLACEMENT  04/08/2021   Procedure: HEMOSTASIS CLIP PLACEMENT;  Surgeon: Lavena Bullion, DO;  Location: [place];  Service: Gastroenterology;;   hip relacement     INCISION AND DRAINAGE Right 09/11/2021   Procedure: INCISION AND DRAINAGE OF RIGHT ARM FISTULA;  Surgeon: Cherre Robins, MD;  Location: [facility];  Service: Vascular;  Laterality: Right;   IR FLUORO GUIDE CV LINE RIGHT  03/31/2021   IR REMOVAL TUN CV CATH W/O FL  08/15/2021   IR US GUIDE VASC ACCESS RIGHT  03/31/2021   KIDNEY SURGERY     LEFT HEART CATH AND CORONARY ANGIOGRAPHY N/A 07/09/2020   Procedure: LEFT HEART CATH AND CORONARY ANGIOGRAPHY;  Surgeon: Leonie Man, MD;  Location: [place] CV LAB;  Service: Cardiovascular;  Laterality: N/A;   LEFT HEART CATHETERIZATION WITH CORONARY ANGIOGRAM N/A 02/23/2011   Procedure: LEFT HEART CATHETERIZATION WITH CORONARY ANGIOGRAM;  Surgeon: [name], MD;  Location[facility] CATH LAB;  Service: Cardiovascular;  Laterality: N/A;   LEFT HEART CATHETERIZATION WITH CORONARY ANGIOGRAM N/A 03/18/2011   Procedure: LEFT HEART CATHETERIZATION WITH CORONARY ANGIOGRAM;  Surgeon: [name], MD;  Location: [facility] CATH LAB;  Service: Cardiovascular;  Laterality: N/A;   PERCUTANEOUS CORONARY STENT INTERVENTION (PCI-S) N/A 02/23/2011   Procedure: PERCUTANEOUS CORONARY STENT INTERVENTION (PCI-S);  Surgeon: [name], MD;  Location: [facility] CATH LAB;  Service: Cardiovascular;  Laterality: N/A;   TEMPORARY PACEMAKER INSERTION N/A 02/23/2011   Procedure: TEMPORARY  PACEMAKER INSERTION;  Surgeon: [name], MD;  Location: [facility] CATH LAB;  Service: Cardiovascular;  Laterality: N/A;   THROMBECTOMY W/ EMBOLECTOMY Right 04/03/2021   Procedure: THROMBECTOMY ARTERIOVENOUS FISTULA;  Surgeon: Cherre Robins, MD;  Location: [facility] OR;  Service: Vascular;  Laterality: Right;   Family History Family History  Problem Relation Age of Onset   Heart attack Other 44    Social History Social History   Tobacco Use   Smoking status: Former    Types: Cigarettes    Quit date: 02/24/2007    Years since quitting: 14.7   Smokeless tobacco: Never  Vaping Use   Vaping Use: Never used  Substance Use Topics   Alcohol use: Not Currently    Alcohol/week: 1.0 standard drink of alcohol    Types: 1 Standard drinks or equivalent per week    Comment: socially    Drug use: No   Allergies Bee venom and Influenza vaccines  Review of Systems Review of Systems  Gastrointestinal:  Positive for nausea and vomiting.    Physical Exam Vital Signs  I have reviewed the triage vital signs BP (!) 155/57   Pulse 91   Temp 98.9 F (37.2 C) (Oral)   Resp 15   SpO2 100%   Physical Exam Constitutional:      General: He is not in acute distress.    Appearance: Normal appearance.  HENT:     Head: Normocephalic and atraumatic.     Nose: No congestion or rhinorrhea.  Eyes:     General:        Right eye: No discharge.        Left eye: No discharge.     Extraocular Movements: Extraocular movements intact.  Pupils: Pupils are equal, round, and reactive to light.  Cardiovascular:     Rate and Rhythm: Normal rate and regular rhythm.     Heart sounds: No murmur heard. Pulmonary:     Effort: No respiratory distress.     Breath sounds: No wheezing or rales.  Abdominal:     General: There is no distension.     Tenderness: There is abdominal tenderness.  Musculoskeletal:        General: Normal range of motion.     Cervical back: Normal range of motion.  Skin:    General:  Skin is warm and dry.  Neurological:     General: No focal deficit present.     Mental Status: He is alert.     ED Results and Treatments Labs (all labs ordered are listed, but only abnormal results are displayed) Labs Reviewed  COMPREHENSIVE METABOLIC PANEL - Abnormal; Notable for the following components:      Result Value   Chloride 97 (*)    Glucose, Bld 123 (*)    Creatinine, Ser 3.00 (*)    Albumin 3.2 (*)    GFR, Estimated 20 (*)    All other components within normal limits  CBC WITH DIFFERENTIAL/PLATELET - Abnormal; Notable for the following components:   WBC 17.9 (*)    RBC 4.00 (*)    Hemoglobin 12.3 (*)    HCT 38.6 (*)    RDW 19.0 (*)    Platelets 422 (*)    Neutro Abs 16.4 (*)    Lymphs Abs 0.6 (*)    Abs Immature Granulocytes 0.16 (*)    All other components within normal limits  RESP PANEL BY RT-PCR (FLU A&B, COVID) ARPGX2  PROTIME-INR  TYPE AND SCREEN                                                                                                                          Radiology No results found.  Pertinent labs & imaging results that were available during my care of the patient were reviewed by me and considered in my medical decision making (see MDM for details).  Medications Ordered in ED Medications  pantoprazole (PROTONIX) injection 40 mg (40 mg Intravenous Given 11/05/21 1816)  ondansetron (ZOFRAN) injection 4 mg (4 mg Intravenous Given 11/05/21 1817)  Procedures Procedures  (including critical care time)  Medical Decision Making / ED Course   This patient presents to the ED for concern of hematemesis, this involves an extensive number of treatment options, and is a complaint that carries with it a high risk of complications and morbidity.  The differential diagnosis includes peptic ulcer disease, esophageal  varices, Mallory-Weiss, dieulafoy  lesion  MDM: Patient seen emergency room for evaluation of hematemesis.  Physical exam largely unremarkable and patient remains hemodynamically stable on his home 4 L O2.  Mild epigastric tenderness to palpation.  Laboratory evaluation with a white count of 17.9 which may be elevated in the setting of his active COVID-19 infection, hemoglobin is 12.3 which is significant improved from other evaluations but given that his platelet count is also elevated at 422, suspect that patient may be volume down from his recent dialysis section elevating all 3 of his cell lines.  Creatinine is 3 but chemistry otherwise unremarkable.  Type and screen obtained and INR is normal.  Patient given Zofran and Protonix and GI was consulted.  I spoke with Dr. Rush Landmark who recommends PPI administration and hospital admission and he will round on the patient.  Patient then admitted.   Additional history obtained:  -External records from outside source obtained and reviewed including: Chart review including previous notes, labs, imaging, consultation notes   Lab Tests: -I ordered, reviewed, and interpreted labs.   The pertinent results include:   Labs Reviewed  COMPREHENSIVE METABOLIC PANEL - Abnormal; Notable for the following components:      Result Value   Chloride 97 (*)    Glucose, Bld 123 (*)    Creatinine, Ser 3.00 (*)    Albumin 3.2 (*)    GFR, Estimated 20 (*)    All other components within normal limits  CBC WITH DIFFERENTIAL/PLATELET - Abnormal; Notable for the following components:   WBC 17.9 (*)    RBC 4.00 (*)    Hemoglobin 12.3 (*)    HCT 38.6 (*)    RDW 19.0 (*)    Platelets 422 (*)    Neutro Abs 16.4 (*)    Lymphs Abs 0.6 (*)    Abs Immature Granulocytes 0.16 (*)    All other components within normal limits  RESP PANEL BY RT-PCR (FLU A&B, COVID) ARPGX2  PROTIME-INR  TYPE AND SCREEN      EKG   EKG Interpretation  Date/Time:  Wednesday November 05 2021 17:50:23 EDT Ventricular Rate:  97 PR Interval:  192 QRS Duration: 105 QT Interval:  383 QTC Calculation: 487 R Axis:   95 Text Interpretation: Sinus rhythm Probable left atrial enlargement Anterior infarct, old Left bundle branch block Confirmed by Rosa ([id number]) on 11/05/2021 8:43:51 PM         Medicines ordered and prescription drug management: Meds ordered this encounter  Medications   pantoprazole (PROTONIX) injection 40 mg   DISCONTD: ondansetron (ZOFRAN) injection 4 mg   DISCONTD: lidocaine (XYLOCAINE) 2 % viscous mouth solution 15 mL   DISCONTD: ondansetron (ZOFRAN) 8 mg in sodium chloride 0.9 % 50 mL IVPB   ondansetron (ZOFRAN) injection 4 mg    -I have reviewed the patients home medicines and have made adjustments as needed  Critical interventions none  Consultations Obtained: I requested consultation with the gastroenterologist,  and discussed lab and imaging findings as well as pertinent plan - they recommend: Hospital admission and PPI administration   Cardiac Monitoring: The patient was maintained on a cardiac monitor.  I personally viewed and interpreted the cardiac monitored which showed an underlying rhythm of: NSR  Social Determinants of Health:  Factors impacting patients care include: none   Reevaluation: After the interventions noted above, I reevaluated the patient and found that they have :improved  Co morbidities that complicate the patient evaluation  Past Medical History:  Diagnosis Date   Abnormal echocardiogram 03/08/2021   Abnormal finding on GI tract imaging    Acute blood loss anemia    Acute pancreatitis 08/12/2020   Arthritis    Carotid stenosis    Community acquired pneumonia 04/06/2021   COPD (chronic obstructive pulmonary disease) ([place])    Coronary artery disease    S/p PCI 2011;  NSTEMI 12/12:  LHC/PCI 02/23/11: LAD 60% after the septal perforator, D1 occluded with distal collaterals, proximal RI 30-40%, AV  circumflex stent patent with 60% stenosis after the stent, RCA 99%, EF 60-65%.  His RCA was treated with a bare-metal stent   CVA (cerebral infarction) 2011   Right cerebral; total obstruction of the right ICA   Diverticulitis    Hypertension    Paroxysmal atrial fibrillation with RVR ([place]) 04/09/2021   Pleuritic chest pain 04/08/2014   Pressure injury of skin 03/30/2021   Rash 03/05/2021   Rectal bleeding 07/2015   Refractory nausea and vomiting 06/20/2020   Renal carcinoma ([place])    Sepsis, unspecified organism ([place]) 04/11/2016   Stroke ([place])    Tobacco abuse, in remission       Dispostion: I considered admission for this patient, and due to hematemesis, patient require hospital admission     Final Clinical Impression(s) / ED Diagnoses Final diagnoses:  None     '@PCDICTATION'$ @    Teressa Lower, MD 11/05/21 2045

## 2021-11-05 NOTE — ED Triage Notes (Signed)
Pt BIB [facility] from a dialysis center where he was dialyzed today. Ot completed whole treatment, however, per staff, pt started throwing up blood half way through the treatment. Had 2 episodes. Hx copd. On 4L O2 at baseline. Pt was diagnosed w Covid yesterday. A&OX4. Ambulatory.

## 2021-11-05 NOTE — Assessment & Plan Note (Signed)
Chronic. Stable. If he stays past tomorrow, he will need nephrology consult for routine HD on Friday.

## 2021-11-05 NOTE — Assessment & Plan Note (Signed)
Observation med/surg bed. Start protonix gtts. NPO. EDP has consulted [facility]. Repeat CBC in AM.

## 2021-11-05 NOTE — H&P (Signed)
History and Physical    Wayne Meadows UTM:[id number] DOB: [birth year] DOA: 11/05/2021  DOS: the patient was seen and examined on 11/05/2021  PCP: [name]   Patient coming from: [name]  I have personally briefly reviewed patient's old medical records in [place]  CC: hematemesis HPI: 87 yo WM with hx of ESRD on HD on M, W, F, hx of HTN, hypothyroidism, hx of gastric and duodenal ulcer 03-2021, chronic hypoxic respiratory failure on O2 @ 2.5 L/min, COPD, presents to the ER today with hematemesis after his dialysis.  Patient states that he had some vomiting of blood yesterday while he is in the nursing home in the evening.  He continued this this afternoon after dialysis around 1 PM.  Patient brought to the ER.  Patient was diagnosed with COVID on 11/04/2021 at [facility].  See the report from the nursing home.  HgB stable in ER. No further hematemesis. EDP has consulted [facility]  TRH consulted for admission.  Pt denies any abd pain. IV zofran relieved his nausea. No chest pain or SOB.      ED Course: started on protonix gtts. [facility] consulted.  Review of Systems:  Review of Systems  Constitutional: Negative.   HENT: Negative.    Eyes: Negative.   Respiratory: Negative.    Cardiovascular: Negative.   Gastrointestinal:  Positive for abdominal pain, nausea and vomiting.  Genitourinary: Negative.   Musculoskeletal: Negative.   Skin: Negative.   Neurological: Negative.   Endo/Heme/Allergies: Negative.   Psychiatric/Behavioral: Negative.    All other systems reviewed and are negative.   Past Medical History:  Diagnosis Date   Abnormal echocardiogram 03/08/2021   Abnormal finding on GI tract imaging    Acute blood loss anemia    Acute pancreatitis 08/12/2020   Arteriovenous fistula infection ([place]) 04/18/2021   Arthritis    Carotid stenosis    Community acquired pneumonia 04/06/2021   COPD (chronic obstructive pulmonary disease)  ([place])    Coronary artery disease    S/p PCI 2011;  NSTEMI 12/12:  LHC/PCI 02/23/11: LAD 60% after the septal perforator, D1 occluded with distal collaterals, proximal RI 30-40%, AV circumflex stent patent with 60% stenosis after the stent, RCA 99%, EF 60-65%.  His RCA was treated with a bare-metal stent   CVA (cerebral infarction) 2011   Right cerebral; total obstruction of the right ICA   Diverticulitis    Hypertension    Paroxysmal atrial fibrillation with RVR ([place]) 04/09/2021   Partial small bowel obstruction ([place]) 08/17/2021   Pleuritic chest pain 04/08/2014   Pressure injury of skin 03/30/2021   Rash 03/05/2021   Rectal bleeding 07/2015   Refractory nausea and vomiting 06/20/2020   Renal carcinoma ([place])    Sepsis, unspecified organism ([place]) 04/11/2016   Stroke ([place])    Tobacco abuse, in remission    Unstable angina ([place]) 03/18/2011    Past Surgical History:  Procedure Laterality Date   AV FISTULA PLACEMENT Right 04/03/2021   Procedure: ARTERIOVENOUS (AV) CREATION OF RIGHT ARM BRACHIOCEPHALIC FISTULA;  Surgeon: Cherre Robins, MD;  Location: [facility];  Service: Vascular;  Laterality: Right;   BIOPSY  04/08/2021   Procedure: BIOPSY;  Surgeon: Lavena Bullion, DO;  Location: [facility];  Service: Gastroenterology;;   COLON SURGERY     COLONOSCOPY N/A 08/22/2015   Procedure: COLONOSCOPY;  Surgeon: [name], MD;  Location: [facility];  Service: Endoscopy;  Laterality: N/A;   ESOPHAGOGASTRODUODENOSCOPY N/A 07/18/2020  Procedure: ESOPHAGOGASTRODUODENOSCOPY (EGD);  Surgeon: Milus Banister, MD;  Location: [facility];  Service: Endoscopy;  Laterality: N/A;   ESOPHAGOGASTRODUODENOSCOPY (EGD) WITH PROPOFOL N/A 06/21/2020   Procedure: ESOPHAGOGASTRODUODENOSCOPY (EGD) WITH PROPOFOL;  Surgeon: Irene Shipper, MD;  Location: [facility] ENDOSCOPY;  Service: Endoscopy;  Laterality: N/A;   ESOPHAGOGASTRODUODENOSCOPY (EGD) WITH PROPOFOL N/A 04/08/2021   Procedure: ESOPHAGOGASTRODUODENOSCOPY (EGD)  WITH PROPOFOL;  Surgeon: Lavena Bullion, DO;  Location: [facility];  Service: Gastroenterology;  Laterality: N/A;   EUS N/A 07/18/2020   Procedure: UPPER ENDOSCOPIC ULTRASOUND (EUS) RADIAL;  Surgeon: Milus Banister, MD;  Location: [facility];  Service: Endoscopy;  Laterality: N/A;   FINE NEEDLE ASPIRATION N/A 07/18/2020   Procedure: FINE NEEDLE ASPIRATION (FNA) LINEAR;  Surgeon: Milus Banister, MD;  Location: [facility];  Service: Endoscopy;  Laterality: N/A;   HEMOSTASIS CLIP PLACEMENT  04/08/2021   Procedure: HEMOSTASIS CLIP PLACEMENT;  Surgeon: Lavena Bullion, DO;  Location: [facility];  Service: Gastroenterology;;   hip relacement     INCISION AND DRAINAGE Right 09/11/2021   Procedure: INCISION AND DRAINAGE OF RIGHT ARM FISTULA;  Surgeon: Cherre Robins, MD;  Location: [facility];  Service: Vascular;  Laterality: Right;   IR FLUORO GUIDE CV LINE RIGHT  03/31/2021   IR REMOVAL TUN CV CATH W/O FL  08/15/2021   IR US GUIDE VASC ACCESS RIGHT  03/31/2021   KIDNEY SURGERY     LEFT HEART CATH AND CORONARY ANGIOGRAPHY N/A 07/09/2020   Procedure: LEFT HEART CATH AND CORONARY ANGIOGRAPHY;  Surgeon: Leonie Man, MD;  Location: [facility];  Service: Cardiovascular;  Laterality: N/A;   LEFT HEART CATHETERIZATION WITH CORONARY ANGIOGRAM N/A 02/23/2011   Procedure: LEFT HEART CATHETERIZATION WITH CORONARY ANGIOGRAM;  Surgeon: [name], MD;  Location: [facility] CATH LAB;  Service: Cardiovascular;  Laterality: N/A;   LEFT HEART CATHETERIZATION WITH CORONARY ANGIOGRAM N/A 03/18/2011   Procedure: LEFT HEART CATHETERIZATION WITH CORONARY ANGIOGRAM;  Surgeon: [name], MD;  Location[facility] CATH LAB;  Service: Cardiovascular;  Laterality: N/A;   PERCUTANEOUS CORONARY STENT INTERVENTION (PCI-S) N/A 02/23/2011   Procedure: PERCUTANEOUS CORONARY STENT INTERVENTION (PCI-S);  Surgeon: [name], MD;  Location: [facility] CATH LAB;  Service: Cardiovascular;  Laterality: N/A;   TEMPORARY PACEMAKER  INSERTION N/A 02/23/2011   Procedure: TEMPORARY PACEMAKER INSERTION;  Surgeon: [name], MD;  Location: [facility] CATH LAB;  Service: Cardiovascular;  Laterality: N/A;   THROMBECTOMY W/ EMBOLECTOMY Right 04/03/2021   Procedure: THROMBECTOMY ARTERIOVENOUS FISTULA;  Surgeon: Cherre Robins, MD;  Location: [facility];  Service: Vascular;  Laterality: Right;     reports that he quit smoking about 14 years ago. His smoking use included cigarettes. He has never used smokeless tobacco. He reports that he does not currently use alcohol after a past usage of about 1.0 standard drink of alcohol per week. He reports that he does not use drugs.  Allergies  Allergen Reactions   Bee Venom Anaphylaxis    Has epi pen   Influenza Vaccines Other (See Comments)    "Mortally sick for 2 weeks"    Family History  Problem Relation Age of Onset   Heart attack Other 56    Prior to Admission medications   Medication Sig Start Date End Date Taking? Authorizing Provider  acetaminophen (TYLENOL) 325 MG tablet Take 2 tablets (650 mg total) by mouth every 6 (six) hours as needed for mild pain (or Fever >/= 101). Patient not taking: Reported on 10/21/2021  04/10/21   Bonnielee Haff, MD  Amino Acids-Protein Hydrolys (PRO-STAT SUGAR FREE PO) Take 30 mLs by mouth every evening.    [provider]  aspirin EC 81 MG tablet Take 1 tablet (81 mg total) by mouth daily. Swallow whole. 10/31/21   Lavina Hamman, MD  benzonatate (TESSALON) 100 MG capsule Take 1 capsule (100 mg total) by mouth 3 (three) times daily. 10/30/21   Lavina Hamman, MD  busPIRone (BUSPAR) 5 MG tablet Take 1 tablet (5 mg total) by mouth 3 (three) times daily. (0800 & 2000) 10/30/21   Lavina Hamman, MD  Darbepoetin Alfa (ARANESP) 60 MCG/0.3ML SOSY injection Inject 0.3 mLs (60 mcg total) into the vein every Monday with hemodialysis. 09/30/21   Cherene Altes, MD  dextromethorphan-guaiFENesin (MUCINEX DM) 30-600 MG 12hr tablet Take 1 tablet by mouth 2  (two) times daily. 10/30/21   Lavina Hamman, MD  diltiazem (CARDIZEM CD) 240 MG 24 hr capsule Take 1 capsule (240 mg total) by mouth daily. 10/30/21   Lavina Hamman, MD  guaiFENesin (ROBITUSSIN) 100 MG/5ML liquid Take 10 mLs by mouth every 4 (four) hours as needed for cough or to loosen phlegm. 10/30/21   Lavina Hamman, MD  HYDROcodone-acetaminophen (NORCO) 10-325 MG tablet Take 1 tablet by mouth every 4 (four) hours as needed for severe pain or moderate pain. 10/30/21   Lavina Hamman, MD  Ipratropium-Albuterol (COMBIVENT) 20-100 MCG/ACT AERS respimat Inhale 1 puff into the lungs 4 (four) times daily. (0900, 1300, 1700 & 2100)    [provider]  ipratropium-albuterol (DUONEB) 0.5-2.5 (3) MG/3ML SOLN Take 3 mLs by nebulization every 4 (four) hours as needed (shortness of breath/wheezing).    [provider]  levothyroxine (SYNTHROID) 50 MCG tablet Take 50 mcg by mouth daily before breakfast. (0630)    [provider]  LIDOCAINE PAIN RELIEF 4 % Place 1 patch onto the skin daily. Apply 1 patch to back in the morning and remove at bedtime for pain. 10/17/21   [provider]  melatonin 5 MG TABS Take 5 mg by mouth at bedtime. 09/30/21   [provider]  Menthol, Topical Analgesic, (BIOFREEZE) 4 % GEL Apply 1 Application topically 3 (three) times daily as needed (pain). Patient not taking: Reported on 10/21/2021    [provider]  metoprolol tartrate (LOPRESSOR) 25 MG tablet Take 0.5 tablets (12.5 mg total) by mouth 2 (two) times daily. 10/30/21   Lavina Hamman, MD  multivitamin (RENA-VIT) TABS tablet Take 1 tablet by mouth at bedtime. 10/30/21   Lavina Hamman, MD  naphazoline-glycerin (CLEAR EYES REDNESS) 0.012-0.25 % SOLN Place 1-2 drops into the right eye 3 (three) times daily. 04/23/21   Alma Friendly, MD  neomycin-bacitracin-polymyxin (NEOSPORIN) OINT Apply 1 application topically 3 (three) times daily. To right antecubital area 04/10/21    Bonnielee Haff, MD  Nutritional Supplements (NOVASOURCE RENAL) LIQD Take 237 mLs by mouth in the morning and at bedtime. (0800 & 1700)    [provider]  pantoprazole (PROTONIX) 40 MG tablet Take 1 tablet (40 mg total) by mouth daily for 14 days. 10/30/21 11/13/21  Lavina Hamman, MD  polyethylene glycol (MIRALAX / GLYCOLAX) 17 g packet Take 17 g by mouth daily. Patient not taking: Reported on 10/21/2021 05/01/21   Sherwood Gambler, MD  pravastatin (PRAVACHOL) 40 MG tablet Take 40 mg by mouth at bedtime. (2100)    [provider]  predniSONE (DELTASONE) 10 MG tablet Take '40mg'$   daily for 3days,Take '30mg'$  daily for 3days,Take '20mg'$  daily for 3days,Take '10mg'$  daily for 3days, then stop 10/31/21   Lavina Hamman, MD  senna (SENOKOT) 8.6 MG tablet Take 2 tablets by mouth at bedtime. 10/15/21   [provider]  tamsulosin (FLOMAX) 0.4 MG CAPS capsule Take 1 capsule (0.4 mg total) by mouth daily. 04/11/21   Bonnielee Haff, MD    Physical Exam: Vitals:   11/05/21 1830 11/05/21 1900 11/05/21 1915 11/05/21 2030  BP: (!) 152/58 (!) 154/59 (!) 155/57 (!) 139/56  Pulse: 89 88 91 85  Resp: '14 13 15 16  '$ Temp:      TempSrc:      SpO2: 100% 100% 100% 99%    Physical Exam Vitals and nursing note reviewed.  Constitutional:      Appearance: He is not toxic-appearing or diaphoretic.     Comments: Appears chronically ill  HENT:     Head: Normocephalic and atraumatic.     Mouth/Throat:     Mouth: Mucous membranes are moist.  Cardiovascular:     Rate and Rhythm: Normal rate and regular rhythm.     Pulses: Normal pulses.  Pulmonary:     Effort: Pulmonary effort is normal. No respiratory distress.     Breath sounds: Decreased breath sounds present. No wheezing.  Abdominal:     General: Bowel sounds are normal. There is no distension.     Palpations: Abdomen is soft.     Tenderness: There is abdominal tenderness in the epigastric area. There is no guarding or rebound.  Musculoskeletal:      Right lower leg: No edema.     Left lower leg: No edema.     Comments: RUE +thrill/bruit from AV fistula/graft  Skin:    General: Skin is warm and dry.     Capillary Refill: Capillary refill takes less than 2 seconds.  Neurological:     General: No focal deficit present.     Mental Status: He is alert and oriented to person, place, and time.      Labs on Admission: I have personally reviewed following labs and imaging studies  CBC: Recent Labs  Lab 11/05/21 1750  WBC 17.9*  NEUTROABS 16.4*  HGB 12.3*  HCT 38.6*  MCV 96.5  PLT 811*   Basic Metabolic Panel: Recent Labs  Lab 11/05/21 1750  NA 137  K 4.6  CL 97*  CO2 26  GLUCOSE 123*  BUN 16  CREATININE 3.00*  CALCIUM 8.9   GFR: Estimated Creatinine Clearance: 18.9 mL/min (A) (by C-G formula based on SCr of 3 mg/dL (H)). Liver Function Tests: Recent Labs  Lab 11/05/21 1750  AST 21  ALT 21  ALKPHOS 44  BILITOT 1.1  PROT 7.1  ALBUMIN 3.2*   No results for input(s): "LIPASE", "AMYLASE" in the last 168 hours. No results for input(s): "AMMONIA" in the last 168 hours. Coagulation Profile: Recent Labs  Lab 11/05/21 1750  INR 1.2   Cardiac Enzymes: No results for input(s): "CKTOTAL", "CKMB", "CKMBINDEX", "TROPONINI", "TROPONINIHS" in the last 168 hours. BNP (last 3 results) No results for input(s): "PROBNP" in the last 8760 hours. HbA1C: No results for input(s): "HGBA1C" in the last 72 hours. CBG: No results for input(s): "GLUCAP" in the last 168 hours. Lipid Profile: No results for input(s): "CHOL", "HDL", "LDLCALC", "TRIG", "CHOLHDL", "LDLDIRECT" in the last 72 hours. Thyroid Function Tests: No results for input(s): "TSH", "T4TOTAL", "FREET4", "T3FREE", "THYROIDAB" in the last 72 hours. Anemia Panel: No results for input(s): "  VITAMINB12", "FOLATE", "FERRITIN", "TIBC", "IRON", "RETICCTPCT" in the last 72 hours. Urine analysis:    Component Value Date/Time   COLORURINE YELLOW 05/01/2021 1446    APPEARANCEUR TURBID (A) 05/01/2021 1446   LABSPEC 1.025 05/01/2021 1446   PHURINE 5.0 05/01/2021 1446   GLUCOSEU NEGATIVE 05/01/2021 1446   HGBUR LARGE (A) 05/01/2021 1446   BILIRUBINUR NEGATIVE 05/01/2021 1446   KETONESUR 5 (A) 05/01/2021 1446   PROTEINUR >=300 (A) 05/01/2021 1446   NITRITE NEGATIVE 05/01/2021 1446   LEUKOCYTESUR MODERATE (A) 05/01/2021 1446    Radiological Exams on Admission: I have personally reviewed images No results found.  EKG: My personal interpretation of EKG shows: NSR, LBBB    Assessment/Plan Principal Problem:   UGI bleed Active Problems:   ESRD (end stage renal disease) on MWF dialysis (HCC)   COPD (chronic obstructive pulmonary disease) (HCC)   Paroxysmal atrial fibrillation (HCC) - not on systemic anticoagulation due to fall risk   Essential hypertension   Acquired hypothyroidism   Chronic respiratory failure with hypoxia (HCC) - 2.5 L/min continuously   Lives in nursing home - [place]   COVID-19 virus infection    Assessment and Plan: * UGI bleed Observation med/surg bed. Start protonix gtts. NPO. EDP has consulted [facility]. Repeat CBC in AM.  ESRD (end stage renal disease) on MWF dialysis (HCC) Chronic. Stable. If he stays past tomorrow, he will need nephrology consult for routine HD on Friday.  Lives in nursing home - [name] chronic  Chronic respiratory failure with hypoxia (HCC) - 2.5 L/min continuously Stable. Continue supplemental O2 @ 2.5 L/min  Acquired hypothyroidism Stable. Continue synthroid 50 mcg daily  Essential hypertension Stable. Continue cardizem cd 240 mg daily, lopressor 12.5 mg bid  Paroxysmal atrial fibrillation (HCC) - not on systemic anticoagulation due to fall risk Stable. Hold ASA due to UGI bleed. Not on systemic anticoagulation due to fall risk.  COPD (chronic obstructive pulmonary disease) (HCC) Stable. Currently not exacerbated.  COVID-19 virus infection No hypoxia. Asymptomatic. No need  to start antivirals.   DVT prophylaxis: SCDs Code Status: Full Code Family Communication: no family at bedside  Disposition Plan: return to SNF  Consults called: EDP has consulted [facility] Dr. Rush Landmark  Admission status: Observation, Med-Surg   Kristopher Oppenheim, DO [facility] 11/05/2021, 8:59 PM

## 2021-11-05 NOTE — Assessment & Plan Note (Signed)
Stable. Continue cardizem cd 240 mg daily, lopressor 12.5 mg bid

## 2021-11-05 NOTE — Assessment & Plan Note (Signed)
Stable. Hold ASA due to UGI bleed. Not on systemic anticoagulation due to fall risk.

## 2021-11-05 NOTE — Assessment & Plan Note (Signed)
chronic

## 2021-11-05 NOTE — Assessment & Plan Note (Signed)
No hypoxia. Asymptomatic. No need to start antivirals.

## 2021-11-05 NOTE — Assessment & Plan Note (Signed)
Stable.  Currently not exacerbated. 

## 2021-11-05 NOTE — Subjective & Objective (Signed)
CC: hematemesis HPI: 87 yo WM with hx of ESRD on HD on M, W, F, hx of HTN, hypothyroidism, hx of gastric and duodenal ulcer 03-2021, chronic hypoxic respiratory failure on O2 @ 2.5 L/min, COPD, presents to the ER today with hematemesis after his dialysis.  Patient states that he had some vomiting of blood yesterday while he is in the nursing home in the evening.  He continued this this afternoon after dialysis around 1 PM.  Patient brought to the ER.  Patient was diagnosed with COVID on 11/04/2021 at [facility].  See the report from the nursing home.  HgB stable in ER. No further hematemesis. EDP has consulted [facility]  TRH consulted for admission.  Pt denies any abd pain. IV zofran relieved his nausea. No chest pain or SOB.

## 2021-11-06 ENCOUNTER — Observation Stay (HOSPITAL_COMMUNITY): Payer: No Typology Code available for payment source | Admitting: Anesthesiology

## 2021-11-06 ENCOUNTER — Encounter (HOSPITAL_COMMUNITY): Payer: Self-pay | Admitting: Internal Medicine

## 2021-11-06 ENCOUNTER — Observation Stay (HOSPITAL_BASED_OUTPATIENT_CLINIC_OR_DEPARTMENT_OTHER): Payer: No Typology Code available for payment source | Admitting: Anesthesiology

## 2021-11-06 ENCOUNTER — Other Ambulatory Visit: Payer: Self-pay

## 2021-11-06 ENCOUNTER — Encounter (HOSPITAL_COMMUNITY)
Admission: EM | Disposition: A | Discharge status: Other Acute Inpatient Hospital | Payer: Self-pay | Source: Home / Self Care | Attending: Student

## 2021-11-06 DIAGNOSIS — Z87891 Personal history of nicotine dependence: Secondary | ICD-10-CM | POA: Diagnosis not present

## 2021-11-06 DIAGNOSIS — N186 End stage renal disease: Secondary | ICD-10-CM | POA: Diagnosis not present

## 2021-11-06 DIAGNOSIS — J449 Chronic obstructive pulmonary disease, unspecified: Secondary | ICD-10-CM

## 2021-11-06 DIAGNOSIS — K2100 Gastro-esophageal reflux disease with esophagitis, without bleeding: Secondary | ICD-10-CM | POA: Diagnosis not present

## 2021-11-06 DIAGNOSIS — I252 Old myocardial infarction: Secondary | ICD-10-CM | POA: Diagnosis not present

## 2021-11-06 DIAGNOSIS — I25119 Atherosclerotic heart disease of native coronary artery with unspecified angina pectoris: Secondary | ICD-10-CM | POA: Diagnosis not present

## 2021-11-06 DIAGNOSIS — K2970 Gastritis, unspecified, without bleeding: Secondary | ICD-10-CM | POA: Diagnosis not present

## 2021-11-06 DIAGNOSIS — K920 Hematemesis: Secondary | ICD-10-CM

## 2021-11-06 DIAGNOSIS — K2210 Ulcer of esophagus without bleeding: Secondary | ICD-10-CM | POA: Diagnosis not present

## 2021-11-06 DIAGNOSIS — U071 COVID-19: Secondary | ICD-10-CM | POA: Diagnosis not present

## 2021-11-06 DIAGNOSIS — Z992 Dependence on renal dialysis: Secondary | ICD-10-CM | POA: Diagnosis not present

## 2021-11-06 DIAGNOSIS — I120 Hypertensive chronic kidney disease with stage 5 chronic kidney disease or end stage renal disease: Secondary | ICD-10-CM | POA: Diagnosis not present

## 2021-11-06 DIAGNOSIS — K449 Diaphragmatic hernia without obstruction or gangrene: Secondary | ICD-10-CM | POA: Diagnosis not present

## 2021-11-06 DIAGNOSIS — K209 Esophagitis, unspecified without bleeding: Secondary | ICD-10-CM

## 2021-11-06 DIAGNOSIS — K922 Gastrointestinal hemorrhage, unspecified: Secondary | ICD-10-CM | POA: Diagnosis not present

## 2021-11-06 DIAGNOSIS — R5381 Other malaise: Secondary | ICD-10-CM

## 2021-11-06 HISTORY — PX: BIOPSY: SHX5522

## 2021-11-06 HISTORY — PX: ESOPHAGOGASTRODUODENOSCOPY (EGD) WITH PROPOFOL: SHX5813

## 2021-11-06 LAB — CBC WITH DIFFERENTIAL/PLATELET
Abs Immature Granulocytes: 0.07 10*3/uL (ref 0.00–0.07)
Basophils Absolute: 0 10*3/uL (ref 0.0–0.1)
Basophils Relative: 0 %
Eosinophils Absolute: 0.1 10*3/uL (ref 0.0–0.5)
Eosinophils Relative: 0 %
HCT: 32.5 % — ABNORMAL LOW (ref 39.0–52.0)
Hemoglobin: 10.4 g/dL — ABNORMAL LOW (ref 13.0–17.0)
Immature Granulocytes: 1 %
Lymphocytes Relative: 11 %
Lymphs Abs: 1.4 10*3/uL (ref 0.7–4.0)
MCH: 30.7 pg (ref 26.0–34.0)
MCHC: 32 g/dL (ref 30.0–36.0)
MCV: 95.9 fL (ref 80.0–100.0)
Monocytes Absolute: 1 10*3/uL (ref 0.1–1.0)
Monocytes Relative: 8 %
Neutro Abs: 9.7 10*3/uL — ABNORMAL HIGH (ref 1.7–7.7)
Neutrophils Relative %: 80 %
Platelets: 327 10*3/uL (ref 150–400)
RBC: 3.39 MIL/uL — ABNORMAL LOW (ref 4.22–5.81)
RDW: 19 % — ABNORMAL HIGH (ref 11.5–15.5)
WBC: 12.2 10*3/uL — ABNORMAL HIGH (ref 4.0–10.5)
nRBC: 0.2 % (ref 0.0–0.2)

## 2021-11-06 LAB — HEPATITIS B SURFACE ANTIGEN: Hepatitis B Surface Ag: NONREACTIVE

## 2021-11-06 LAB — HEPATITIS C ANTIBODY: HCV Ab: NONREACTIVE

## 2021-11-06 LAB — HEPATITIS B SURFACE ANTIBODY,QUALITATIVE: Hep B S Ab: NONREACTIVE

## 2021-11-06 LAB — HEPATITIS B CORE ANTIBODY, TOTAL: Hep B Core Total Ab: NONREACTIVE

## 2021-11-06 SURGERY — ESOPHAGOGASTRODUODENOSCOPY (EGD) WITH PROPOFOL
Anesthesia: Monitor Anesthesia Care

## 2021-11-06 MED ORDER — SODIUM CHLORIDE 0.9 % IV SOLN: INTRAVENOUS | Status: DC

## 2021-11-06 MED ORDER — LIDOCAINE HCL (PF) 1 % IJ SOLN: 5.0000 mL | INTRAMUSCULAR | Status: DC | PRN

## 2021-11-06 MED ORDER — LIDOCAINE 2% (20 MG/ML) 5 ML SYRINGE
INTRAMUSCULAR | Status: DC | PRN
  Administered 2021-11-06: 40 mg via INTRAVENOUS

## 2021-11-06 MED ORDER — PANTOPRAZOLE SODIUM 40 MG PO TBEC: DELAYED_RELEASE_TABLET | ORAL | 0 refills | Status: DC

## 2021-11-06 MED ORDER — PANTOPRAZOLE SODIUM 40 MG PO TBEC
40.0000 mg | DELAYED_RELEASE_TABLET | Freq: Two times a day (BID) | ORAL | Status: DC
  Administered 2021-11-06: 40 mg via ORAL
  Filled 2021-11-06: qty 1

## 2021-11-06 MED ORDER — PROPOFOL 500 MG/50ML IV EMUL
INTRAVENOUS | Status: DC | PRN
  Administered 2021-11-06: 100 ug/kg/min via INTRAVENOUS

## 2021-11-06 MED ORDER — HEPARIN SODIUM (PORCINE) 1000 UNIT/ML IJ SOLN: 1000.0000 [IU] | INTRAMUSCULAR | Status: DC | PRN

## 2021-11-06 MED ORDER — LIDOCAINE-PRILOCAINE 2.5-2.5 % EX CREA: 1.0000 | TOPICAL_CREAM | CUTANEOUS | Status: DC | PRN

## 2021-11-06 MED ORDER — HYDROCODONE-ACETAMINOPHEN 10-325 MG PO TABS
1.0000 | ORAL_TABLET | Freq: Once | ORAL | Status: AC
  Administered 2021-11-06: 1 via ORAL
  Filled 2021-11-06: qty 1

## 2021-11-06 MED ORDER — SODIUM CHLORIDE 0.9 % IV SOLN: INTRAVENOUS | Status: DC | PRN

## 2021-11-06 MED ORDER — PENTAFLUOROPROP-TETRAFLUOROETH EX AERO: 1.0000 | INHALATION_SPRAY | CUTANEOUS | Status: DC | PRN

## 2021-11-06 MED ORDER — PNEUMOCOCCAL 20-VAL CONJ VACC 0.5 ML IM SUSY: 0.5000 mL | PREFILLED_SYRINGE | INTRAMUSCULAR | Status: DC

## 2021-11-06 SURGICAL SUPPLY — 15 items

## 2021-11-06 NOTE — Consult Note (Signed)
[place] Admit Date: 11/05/2021 11/06/2021 Rexene Agent Requesting Physician:  Cyndia Skeeters MD  Reason for Consult:  ESRD comanagement HPI:  33M ESRD MWF [facility] using AVF RUE presented to ED yesterday after having hematemsis post HD.  Pt completed treatment was 2kg above EDW.    In ED pt had stable VS, Hb 12.3-->10.4 (higher value probably is some hemoconcentration; Hb at HD unit yesterday pre Tx was 10.8).  No further hematesis overnight.  Is on PPI gtt.  GI to see.  Resides at [facility] SNF,tested positive for COVID 11/04/21.  Not many symptoms.  No fevers, stable O2 status.   PMH Incudes: COPD on chronic O2 Hx/o gastric and duodenal ulcers CAD  Outpt HD Rx [facility], F180, MWF, 4h, EDW 72.5kg, 3K/2.5Ca, Hep 3k IVB qTx  ROS Balance of 12 systems is negative w/ exceptions as above  PMH  Past Medical History:  Diagnosis Date   Abnormal echocardiogram 03/08/2021   Abnormal finding on GI tract imaging    Acute blood loss anemia    Acute pancreatitis 08/12/2020   Arteriovenous fistula infection ([place]) 04/18/2021   Arthritis    Carotid stenosis    Community acquired pneumonia 04/06/2021   COPD (chronic obstructive pulmonary disease) ([place])    Coronary artery disease    S/p PCI 2011;  NSTEMI 12/12:  LHC/PCI 02/23/11: LAD 60% after the septal perforator, D1 occluded with distal collaterals, proximal RI 30-40%, AV circumflex stent patent with 60% stenosis after the stent, RCA 99%, EF 60-65%.  His RCA was treated with a bare-metal stent   CVA (cerebral infarction) 2011   Right cerebral; total obstruction of the right ICA   Diverticulitis    Hypertension    Paroxysmal atrial fibrillation with RVR ([place]) 04/09/2021   Partial small bowel obstruction ([place]) 08/17/2021   Pleuritic chest pain 04/08/2014   Pressure injury of skin 03/30/2021   Rash 03/05/2021   Rectal bleeding 07/2015   Refractory nausea and vomiting 06/20/2020   Renal carcinoma ([place])    Sepsis, unspecified organism ([place]) 04/11/2016    Stroke ([place])    Tobacco abuse, in remission    Unstable angina ([place]) 03/18/2011   [place]  Past Surgical History:  Procedure Laterality Date   AV FISTULA PLACEMENT Right 04/03/2021   Procedure: ARTERIOVENOUS (AV) CREATION OF RIGHT ARM BRACHIOCEPHALIC FISTULA;  Surgeon: Cherre Robins, MD;  Location: [facility];  Service: Vascular;  Laterality: Right;   BIOPSY  04/08/2021   Procedure: BIOPSY;  Surgeon: Lavena Bullion, DO;  Location: [facility];  Service: Gastroenterology;;   COLON SURGERY     COLONOSCOPY N/A 08/22/2015   Procedure: COLONOSCOPY;  Surgeon: [name], MD;  Location: [facility];  Service: Endoscopy;  Laterality: N/A;   ESOPHAGOGASTRODUODENOSCOPY N/A 07/18/2020   Procedure: ESOPHAGOGASTRODUODENOSCOPY (EGD);  Surgeon: Milus Banister, MD;  [facility];  Service: Endoscopy;  Laterality: N/A;   ESOPHAGOGASTRODUODENOSCOPY (EGD) WITH PROPOFOL N/A 06/21/2020   Procedure: ESOPHAGOGASTRODUODENOSCOPY (EGD) WITH PROPOFOL;  Surgeon: Irene Shipper, MD;  Location[facility] ENDOSCOPY;  Service: Endoscopy;  Laterality: N/A;   ESOPHAGOGASTRODUODENOSCOPY (EGD) WITH PROPOFOL N/A 04/08/2021   Procedure: ESOPHAGOGASTRODUODENOSCOPY (EGD) WITH PROPOFOL;  Surgeon: Lavena Bullion, DO;  Location: [facility];  Service: Gastroenterology;  Laterality: N/A;   EUS N/A 07/18/2020   Procedure: UPPER ENDOSCOPIC ULTRASOUND (EUS) RADIAL;  Surgeon: Milus Banister, MD;  Location: [facility];  Service: Endoscopy;  Laterality: N/A;   FINE NEEDLE ASPIRATION N/A 07/18/2020   Procedure: FINE NEEDLE ASPIRATION (FNA) LINEAR;  Surgeon: Milus Banister, MD;  Location: [facility];  Service: Endoscopy;  Laterality: N/A;   HEMOSTASIS CLIP PLACEMENT  04/08/2021   Procedure: HEMOSTASIS CLIP PLACEMENT;  Surgeon: Lavena Bullion, DO;  Location: [place] [facility];  Service: Gastroenterology;;   hip relacement     INCISION AND DRAINAGE Right 09/11/2021   Procedure: INCISION AND DRAINAGE OF RIGHT ARM FISTULA;   Surgeon: Cherre [facility]ins, MD;  Location: [facility];  Service: Vascular;  Laterality: Right;   IR FLUORO GUIDE CV LINE RIGHT  03/31/2021   IR REMOVAL TUN CV CATH W/O FL  08/15/2021   IR US GUIDE VASC ACCESS RIGHT  03/31/2021   KIDNEY SURGERY     LEFT HEART CATH AND CORONARY ANGIOGRAPHY N/A 07/09/2020   Procedure: LEFT HEART CATH AND CORONARY ANGIOGRAPHY;  Surgeon: Leonie Man, MD;  Location: [facility] CV [facility];  Service: Cardiovascular;  Laterality: N/A;   LEFT HEART CATHETERIZATION WITH CORONARY ANGIOGRAM N/A 02/23/2011   Procedure: LEFT HEART CATHETERIZATION WITH CORONARY ANGIOGRAM;  Surgeon: [name], MD;  Location[facility] C[facility] LAB;  Service: Cardiovascular;  Laterality: N/A;   LEFT HEART CATHETERIZATION WITH CORONARY ANGIOGRAM N/A 03/18/2011   Procedure: LEFT HEART CATHETERIZATION WITH CORONARY ANGIOGRAM;  Surgeon: [name], MD;  Location: [facility] C[facility] LAB;  Service: Cardiovascular;  Laterality: N/A;   PERCUTANEOUS CORONARY STENT INTERVENTION (PCI-S) N/A 02/23/2011   Procedure: PERCUTANEOUS CORONARY STENT INTERVENTION (PCI-S);  Surgeon: [name], MD;  Location[facility] C[facility] LAB;  Service: Cardiovascular;  Laterality: N/A;   TEMPORARY PACEMAKER INSERTION N/A 02/23/2011   Procedure: TEMPORARY PACEMAKER INSERTION;  Surgeon: [name], MD;  Location: [facility] CATH LAB;  Service: Cardiovascular;  Laterality: N/A;   THROMBECTOMY W/ EMBOLECTOMY Right 04/03/2021   Procedure: THROMBECTOMY ARTERIOVENOUS FISTULA;  Surgeon: Cherre Robins, MD;  Location[facility] OR;  Service: Vascular;  Laterality: Right;   FH  Family History  Problem Relation Age of Onset   Heart attack Other 40   [place]  reports that he quit smoking about 14 years ago. His smoking use included cigarettes. He has never used smokeless tobacco. He reports that he does not currently use alcohol after a past usage of about 1.0 standard drink of alcohol per week. He reports that he does not use drugs. Allergies  Allergies  Allergen Reactions    Bee Venom Anaphylaxis    Has epi pen   Influenza Vaccines Other (See Comments)    "Mortally sick for 2 weeks"   Home medications Prior to Admission medications   Medication Sig Start Date End Date Taking? Authorizing Provider  acetaminophen (TYLENOL) 325 MG tablet Take 2 tablets (650 mg total) by mouth every 6 (six) hours as needed for mild pain (or Fever >/= 101). 04/10/21  Yes Bonnielee Haff, MD  Amino Acids-Protein Hydrolys (PRO-STAT SUGAR FREE PO) Take 30 mLs by mouth every evening.   Yes [provider]  aspirin EC 81 MG tablet Take 1 tablet (81 mg total) by mouth daily. Swallow whole. 10/31/21  Yes Lavina Hamman, MD  benzonatate (TESSALON) 100 MG capsule Take 1 capsule (100 mg total) by mouth 3 (three) times daily. 10/30/21  Yes Lavina Hamman, MD  busPIRone (BUSPAR) 5 MG tablet Take 1 tablet (5 mg total) by mouth 3 (three) times daily. (0800 & 2000) Patient taking differently: Take 5 mg by mouth 2 (two) times daily. (0800 & 2000) 10/30/21  Yes Lavina Hamman, MD  Darbepoetin Alfa (ARANESP) 60 MCG/0.3ML SOSY injection Inject 0.3  mLs (60 mcg total) into the vein every Monday with hemodialysis. 09/30/21  Yes Cherene Altes, MD  dextromethorphan-guaiFENesin [facility] DM) 30-600 MG 12hr tablet Take 1 tablet by mouth 2 (two) times daily. 10/30/21  Yes Lavina Hamman, MD  diltiazem (CARDIZEM CD) 240 MG 24 hr capsule Take 1 capsule (240 mg total) by mouth daily. 10/30/21  Yes Lavina Hamman, MD  ferrous sulfate 325 (65 FE) MG tablet Take 325 mg by mouth every Monday, Wednesday, and Friday.   Yes [provider]  guaiFENesin (ROBITUSSIN) 100 MG/5ML liquid Take 10 mLs by mouth every 4 (four) hours as needed for cough or to loosen phlegm. 10/30/21  Yes Lavina Hamman, MD  hydrALAZINE (APRESOLINE) 10 MG tablet Take 10 mg by mouth 3 (three) times daily as needed (for SBP greater than 180).   Yes [provider]  HYDROcodone-acetaminophen (NORCO) 10-325 MG tablet Take 1 tablet by  mouth every 4 (four) hours as needed for severe pain or moderate pain. 10/30/21  Yes Lavina Hamman, MD  Ipratropium-Albuterol (COMBIVENT) 20-100 MCG/ACT AERS respimat Inhale 1 puff into the lungs 4 (four) times daily. (0900, 1300, 1700 & 2100)   Yes [provider]  ipratropium-albuterol (DUONEB) 0.5-2.5 (3) MG/3ML SOLN Take 3 mLs by nebulization every 4 (four) hours as needed (shortness of breath/wheezing).   Yes [provider]  levothyroxine (SYNTHROID) 50 MCG tablet Take 50 mcg by mouth daily before breakfast. (0630)   Yes [provider]  LIDOCAINE PAIN RELIEF 4 % Place 1 patch onto the skin daily. Apply 1 patch to back in the morning and remove at bedtime for pain. 10/17/21  Yes [provider]  melatonin 5 MG TABS Take 5 mg by mouth at bedtime. 09/30/21  Yes [provider]  metoprolol tartrate (LOPRESSOR) 25 MG tablet Take 0.5 tablets (12.5 mg total) by mouth 2 (two) times daily. 10/30/21  Yes Lavina Hamman, MD  naphazoline-glycerin (CLEAR EYES REDNESS) 0.012-0.25 % SOLN Place 1-2 drops into the right eye 3 (three) times daily. 04/23/21  Yes Alma Friendly, MD  neomycin-bacitracin-polymyxin (NEOSPORIN) OINT Apply 1 application topically 3 (three) times daily. To right antecubital area 04/10/21  Yes Bonnielee Haff, MD  Nutritional Supplements (NOVASOURCE RENAL) LIQD Take 237 mLs by mouth in the morning and at bedtime. (0800 & 1700)   Yes [provider]  pantoprazole (PROTONIX) 40 MG tablet Take 1 tablet (40 mg total) by mouth daily for 14 days. 10/30/21 11/13/21 Yes Lavina Hamman, MD  polyethylene glycol (MIRALAX / GLYCOLAX) 17 g packet Take 17 g by mouth daily. 05/01/21  Yes Sherwood Gambler, MD  pravastatin (PRAVACHOL) 40 MG tablet Take 40 mg by mouth at bedtime. (2100)   Yes [provider]  senna (SENOKOT) 8.6 MG tablet Take 2 tablets by mouth at bedtime. 10/15/21  Yes [provider]  sevelamer carbonate (RENVELA) 800 MG  tablet Take 1,600 mg by mouth 3 (three) times daily with meals.   Yes [provider]  tamsulosin (FLOMAX) 0.4 MG CAPS capsule Take 1 capsule (0.4 mg total) by mouth daily. 04/11/21  Yes Bonnielee Haff, MD  Menthol, Topical Analgesic, (BIOFREEZE) 4 % GEL Apply 1 Application topically 3 (three) times daily as needed (pain). Patient not taking: Reported on 10/21/2021    [provider]  multivitamin (RENA-VIT) TABS tablet Take 1 tablet by mouth at bedtime. 10/30/21   Lavina Hamman, MD    Current Medications Scheduled Meds:  busPIRone  5  mg Oral TID   diltiazem  240 mg Oral Daily   Ipratropium-Albuterol  1 puff Inhalation QID   levothyroxine  50 mcg Oral Q0600   metoprolol tartrate  12.5 mg Oral BID   [START ON 11/07/2021] pneumococcal 20-valent conjugate vaccine  0.5 mL Intramuscular Tomorrow-1000   Continuous Infusions:  pantoprazole 8 mg/hr (11/06/21 0953)   PRN Meds:.acetaminophen **OR** acetaminophen, ondansetron **OR** ondansetron (ZOFRAN) IV  CBC Recent Labs  Lab 11/05/21 1750 11/06/21 0533  WBC 17.9* 12.2*  NEUTROABS 16.4* 9.7*  HGB 12.3* 10.4*  HCT 38.6* 32.5*  MCV 96.5 95.9  PLT 422* 436   Basic Metabolic Panel Recent Labs  Lab 11/05/21 1750  NA 137  K 4.6  CL 97*  CO2 26  GLUCOSE 123*  BUN 16  CREATININE 3.00*  CALCIUM 8.9    Physical Exam   Blood pressure (!) 149/61, pulse 82, temperature 98.6 F (37 C), temperature source Oral, resp. rate 16, SpO2 98 %. GEN: elderly, chronically ill appearing, NAD ENT: NCAT EYES: EOMI CV: RRR PULM: CTAB, nl wob ABD: s/nt/nd SKIN: no rashyes/lesions, RUE AVF w/o erythema EXT:No sig LEE AVF +B/T  Assessment 86M ESRD with UGIB  UBIG: Hb stable.  VSS. GI to see. On PPI gtt.  No further events since admission ESRD MWF: on schedule, HD otmorrow; hold bolus heparin Anemia: Hb stable in 10s, as above, CTM COVID19 infection, seems mild, ASx, CTM COPD on home O2 CKD-BMD: Ca stable HTN/Vol: trend  post weights, BP stable  Plan As above  Rexene Agent  11/06/2021, 10:04 AM

## 2021-11-06 NOTE — Anesthesia Postprocedure Evaluation (Signed)
Anesthesia Post Note  Patient: SELAH ZELMAN  Procedure(s) Performed: ESOPHAGOGASTRODUODENOSCOPY (EGD) WITH PROPOFOL BIOPSY     Patient location during evaluation: PACU Anesthesia Type: MAC Level of consciousness: awake and alert Pain management: pain level controlled Vital Signs Assessment: post-procedure vital signs reviewed and stable Respiratory status: spontaneous breathing, nonlabored ventilation, respiratory function stable and patient connected to nasal cannula oxygen Cardiovascular status: stable and blood pressure returned to baseline Postop Assessment: no apparent nausea or vomiting Anesthetic complications: no   No notable events documented.  Last Vitals:  Vitals:   11/06/21 1259 11/06/21 1308  BP: (!) 131/52 (!) 128/49  Pulse: 79 78  Resp: 14 15  Temp:    SpO2: 97% 100%    Last Pain:  Vitals:   11/06/21 1308  TempSrc:   PainSc: 6                  Sherea Liptak

## 2021-11-06 NOTE — Plan of Care (Signed)

## 2021-11-06 NOTE — TOC Initial Note (Addendum)
Transition of Care [facility]) - Initial/Assessment Note    Patient Details  Name: LEMMIE VANLANEN MRN: [id number] Date of Birth: May 09, 1934  [facility] CM/SW Contact:    Curlene Labrum, RN Phone Number: 11/06/2021, 10:39 AM  Clinical Narrative:                 April, CM at [facility] called and notified of patient's admission to the hospital from [facility].  The patient is active with [facility] [place] and PCP is Windy Fast, MD.  The [facility] MS[facility] is Lidia Collum - [phone] for follow up for discharge planning needs.  The patient was admitted for observation.  Medicare observation information provided to the patient's daughter by phone and Medicare observation letter to be given to the patient.  The patient is currently in respiratory isolation for COVID infection.  I called and spoke with the patient's daughter by phone and she is aware that patient was admitted to the hospital from [facility] facility.  The patient has been admitted to the LTC facility for years and remains under their care at the facility with chronic oxygen and care needs.  The patient is medically stable for discharge back to the [facility] facility today.  I called and spoke with Narda Rutherford, CM at [facility] and she is able to accept the patient back for care.  Attending physician, Dr. Cyndia Skeeters is aware and will complete discharge summary and orders to return the patient back to the facility for LTC.  I called the patient's daughter and gave her an update regarding her father's return to the facility this afternoon for care.  Discharge summary updated in the hub for the facility.  [facility] was called and transportation was arranged for return to the facility.  Bedside nursing - Please call report to [facility] facility at [phone] for Room 707.  The patient's lunch has been ordered by the tech and patient will return to the facility as long as he is able to tolerate his  lunch.       Patient Goals and CMS Choice        Expected Discharge Plan and Services                                                Prior Living Arrangements/Services                       Activities of Daily Living Home Assistive Devices/Equipment: Gilford Rile (specify type) ADL Screening (condition at time of admission) Patient's cognitive ability adequate to safely complete daily activities?: Yes Is the patient deaf or have difficulty hearing?: No Does the patient have difficulty seeing, even when wearing glasses/contacts?: No Does the patient have difficulty concentrating, remembering, or making decisions?: No Patient able to express need for assistance with ADLs?: Yes Does the patient have difficulty dressing or bathing?: No Independently performs ADLs?: Yes (appropriate for developmental age) Does the patient have difficulty walking or climbing stairs?: Yes Weakness of Legs: Both Weakness of Arms/Hands: None  Permission Sought/Granted                  Emotional Assessment              Admission diagnosis:  UGI bleed [K92.2] Hematemesis with nausea [K92.0] Patient Active Problem List   Diagnosis  Date Noted   UGI bleed 11/05/2021   Lives in nursing home - [name] 11/05/2021   COVID-19 virus infection 11/05/2021   Thoracic compression fracture, sequela 10/30/2021   Multiple fractures of ribs, bilateral, initial encounter for closed fracture 10/30/2021   Back pain    Chronic respiratory failure with hypoxia (HCC) - 2.5 L/min continuously 09/28/2021   Paroxysmal atrial flutter ([place]) 09/28/2021   ESRD (end stage renal disease) ([place]) 09/11/2021   Gastric ulcer without hemorrhage or perforation    Duodenal ulcer    ESRD (end stage renal disease) on MWF dialysis ([place]) 04/06/2021   Proteinuria 03/07/2021   Acquired hypothyroidism 03/05/2021   Anxiety 03/05/2021   Chronic pancreatitis ([place]) 12/24/2020   Esophageal stricture    Benign  neoplasm of transverse colon    Benign neoplasm of colon    Dysphagia    Renal cell cancer ([place])    Essential hypertension    Centrilobular emphysema ([place])    Cholelithiasis 04/26/2011   Paroxysmal atrial fibrillation ([place]) - not on systemic anticoagulation due to fall risk 04/26/2011   Lung nodule 03/02/2011   Liver lesion 03/02/2011   Other hyperlipidemia 03/02/2011   Abnormal CT of the abdomen 02/23/2011   Non Q wave myocardial infarction ([place]) 02/22/2011    Class: Acute   History of CVA (cerebrovascular accident) 02/22/2011   CAD (coronary artery disease) 02/22/2011   COPD (chronic obstructive pulmonary disease) ([place]) 02/22/2011   Tobacco abuse, 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/22/2011   PCP:  Clinic, [place]:   [id number] [place] [facility] [id number]: [phone]: [phone]    Social Determinants of Health (SDOH) Interventions    Readmission Risk Interventions    09/30/2021    2:03 PM 03/21/2021    1:21 PM 03/19/2021    1:23 PM  Readmission Risk Prevention Plan  Transportation Screening Complete Complete   Medication [place] ([place] Complete    PCP or Specialist appointment within 3-5 days of discharge Complete    HRI or Home Care Consult Complete Complete   SW Recovery Care/Counseling Consult Complete Complete Complete  Palliative Care Screening Not Applicable    Skilled Nursing Facility Complete Complete Complete

## 2021-11-06 NOTE — Discharge Summary (Signed)
Physician Discharge Summary  Wayne Meadows UKG:[id number] DOB: [birth year] DOA: 11/05/2021  PCP: [name]  Admit date: 11/05/2021 Discharge date: 11/06/2021 Admitted From: LTC at [facility] Disposition:  LTC at [facility] Recommendations for Outpatient Follow-up:  Follow up with PCP in 1 to 2 weeks Outpatient follow-up with [facility] in 8 weeks for repeat EGD Continue isolation precaution for total of 10 days from 11/04/2021 Check CBC and BMP in 1 week Please follow up on the following pending results: Biopsy from erosive esophagitis   Discharge Condition: Stable CODE STATUS: Full code   Hospital course 87 year old M with PMH of ESRD on HD MWF, COPD/chronic hypoxic RF on 2.5 L, gastric and duodenal ulcer, paroxysmal A-fib not on AC, HTN, hypothyroidism and debility presenting with an episode of hematemesis after dialysis.  Hgb slightly higher than baseline likely from hemoconcentration.  [facility] consulted.     Patient tested positive for COVID-19 at SNF on 11/04/2021.  Patient is not symptomatic.  Fully vaccinated including booster.   Patient has not had further hematemesis.  No melena or hematochezia.  Hgb down to 10.2 which seems to be his baseline.  EGD on 9/14 showed LA grade D esophagitis with no bleeding (biopsied) 2 cm hiatal hernia, gastritis, normal duodenum and no ulcer.  GI recommended Protonix 40 mg twice daily for 8 weeks followed by 40 mg daily, advancing diet as tolerated and follow-up in GI clinic for repeat EGD in 8 weeks.  Not a candidate for Carafate due to ESRD.  See individual problem list below for more.   Problems addressed during this hospitalization Principal Problem:   UGI bleed Active Problems:   ESRD (end stage renal disease) on MWF dialysis ([place])   COPD (chronic obstructive pulmonary disease) (HCC)   Paroxysmal atrial fibrillation (HCC) - not on systemic anticoagulation due to fall risk   Essential hypertension   Acquired  hypothyroidism   Chronic respiratory failure with hypoxia (HCC) - 2.5 L/min continuously   Lives in nursing home - [name]   COVID-19 virus infection   Hematemesis with nausea   Erosive esophagitis   Hiatal hernia   Hematemesis/upper GI bleed: Patient is on low-dose aspirin.  Not on anticoagulation no other NSAID.  Recently started on prednisone taper likely for COPD.  Seems to have resolved.  Initial Hgb of 12.3 likely hemoconcentration.  No melena or hematochezia.  EGD as above. Recent Labs (within last 365 days)              Recent Labs    09/29/21 0803 09/30/21 0402 10/21/21 1554 10/22/21 0252 10/23/21 0451 10/24/21 0734 10/25/21 0948 10/29/21 1335 11/05/21 1750 11/06/21 0533  HGB 8.9* 10.0* 10.2* 9.7* 9.3* 9.4* 9.5* 9.9* 12.3* 10.4*    -GI recs:           -Protonix 40 mg twice daily for 8 weeks followed by 40 mg daily,  -advancing diet as tolerated  -follow-up in GI clinic for repeat EGD in 8 weeks.   -Not a candidate for Carafate due to ESRD.  Erosive esophagitis -Management as above. -Follow-up biopsy   ESRD on HD MWF: No indication for urgent dialysis. -Continue HD outpatient   Chronic COPD/chronic hypoxic RF on 2.5 L at baseline -Complete prednisone taper as previously planned -Continue home inhalers and oxygen   Paroxysmal A-fib not on anticoagulation -Continue home Cardizem and metoprolol   Essential HTN -Continue home meds.   COVID-19 infection: Tested positive for 11/04/2021 at nursing home..  Not symptomatic.  Vaccinated including booster. -Complete prednisone and molnupiravir as previously planned   Debility: Lives at nursing home.  Uses rolling walker and wheelchair at baseline.    Body mass index is 20.63 kg/m.           Vital signs Vitals:   11/06/21 1252 11/06/21 1259 11/06/21 1308 11/06/21 1321  BP: (!) 137/48 (!) 131/52 (!) 128/49 (!) 134/50  Pulse: 77 79 78 74  Temp: (!) 97.3 F (36.3 C)   98.4 F (36.9 C)  Resp: '14 14 15  15  '$ Weight:      SpO2: 100% 97% 100% 94%  TempSrc: Oral   Oral  BMI (Calculated):         Discharge exam  GENERAL: No apparent distress.  Nontoxic. HEENT: MMM.  Vision and hearing grossly intact.  NECK: Supple.  No apparent JVD.  RESP:  No IWOB.  Fair aeration bilaterally. CVS:  RRR. Heart sounds normal.  ABD/GI/GU: BS+. Abd soft, NTND.  MSK/EXT:  Moves extremities. No apparent deformity. No edema.  SKIN: no apparent skin lesion or wound NEURO: Awake and alert. Oriented appropriately.  No apparent focal neuro deficit. PSYCH: Calm. Normal affect.   Discharge Instructions Discharge Instructions     Diet - low sodium heart healthy   Complete by: As directed    Soft diet today.   Increase activity slowly   Complete by: As directed    No wound care   Complete by: As directed       Allergies as of 11/06/2021       Reactions   Bee Venom Anaphylaxis   Has epi pen   Influenza Vaccines Other (See Comments)   "Mortally sick for 2 weeks"        Medication List     STOP taking these medications    aspirin EC 81 MG tablet   multivitamin tablet       TAKE these medications    acetaminophen 325 MG tablet Commonly known as: TYLENOL Take 2 tablets (650 mg total) by mouth every 6 (six) hours as needed for mild pain (or Fever >/= 101). What changed: reasons to take this   antiseptic oral rinse Liqd 10 mLs by Mouth Rinse route 2 (two) times daily.   benzonatate 100 MG capsule Commonly known as: TESSALON Take 1 capsule (100 mg total) by mouth 3 (three) times daily.   Biofreeze 4 % Gel Generic drug: Menthol (Topical Analgesic) Apply 1 Application topically 3 (three) times daily as needed (pain).   busPIRone 5 MG tablet Commonly known as: BUSPAR Take 1 tablet (5 mg total) by mouth 3 (three) times daily. (0800 & 2000) What changed: additional instructions   Darbepoetin Alfa 60 MCG/0.3ML Sosy injection Commonly known as: ARANESP Inject 0.3 mLs (60 mcg total) into  the vein every Monday with hemodialysis.   Decubi-Vite Caps Take 1 capsule by mouth daily.   dextromethorphan-guaiFENesin 30-600 MG 12hr tablet Commonly known as: MUCINEX DM Take 1 tablet by mouth 2 (two) times daily.   diltiazem 240 MG 24 hr capsule Commonly known as: CARDIZEM CD Take 1 capsule (240 mg total) by mouth daily.   HYDROcodone-acetaminophen 10-325 MG tablet Commonly known as: NORCO Take 1 tablet by mouth every 4 (four) hours as needed for severe pain or moderate pain.   ipratropium-albuterol 0.5-2.5 (3) MG/3ML Soln Commonly known as: DUONEB Take 3 mLs by nebulization every 4 (four) hours as needed (shortness of breath, wheezing).   Ipratropium-Albuterol 20-100 MCG/ACT Aers respimat Commonly known as:  COMBIVENT Inhale 1 puff into the lungs 4 (four) times daily.   levothyroxine 50 MCG tablet Commonly known as: SYNTHROID Take 50 mcg by mouth daily before breakfast.   lidocaine 4 % Place 1 patch onto the skin daily. To back   melatonin 5 MG Tabs Take 5 mg by mouth at bedtime.   metoprolol tartrate 25 MG tablet Commonly known as: LOPRESSOR Take 0.5 tablets (12.5 mg total) by mouth 2 (two) times daily.   molnupiravir EUA 200 MG Caps capsule Commonly known as: LAGEVRIO Take 4 capsules by mouth 2 (two) times daily.   naphazoline-glycerin 0.012-0.25 % Soln Commonly known as: CLEAR EYES REDNESS Place 1-2 drops into the right eye 3 (three) times daily.   neomycin-bacitracin-polymyxin Oint Commonly known as: NEOSPORIN Apply 1 application topically 3 (three) times daily. To right antecubital area   NovaSource Renal Liqd Take 237 mLs by mouth in the morning and at bedtime.   pantoprazole 40 MG tablet Commonly known as: PROTONIX Take 1 tablet (40 mg total) by mouth 2 (two) times daily for 60 days, THEN 1 tablet (40 mg total) daily. Start taking on: November 06, 2021 What changed: See the new instructions.   polyethylene glycol 17 g packet Commonly known as:  MIRALAX / GLYCOLAX Take 17 g by mouth daily.   pravastatin 40 MG tablet Commonly known as: PRAVACHOL Take 40 mg by mouth daily. (2100)   predniSONE 10 MG tablet Commonly known as: DELTASONE Take 10-40 mg by mouth See admin instructions. 40 mg daily for 3 days, 30 mg daily for 3 days,  20 mg daily for 3 days,  10 mg daily for 3 days.   PRO-STAT SUGAR FREE PO Take 30 mLs by mouth every evening.   senna 8.6 MG tablet Commonly known as: SENOKOT Take 8.6 mg by mouth 2 (two) times daily.   sodium chloride 0.65 % Soln nasal spray Commonly known as: OCEAN Place 2 sprays into both nostrils 2 (two) times daily.   tamsulosin 0.4 MG Caps capsule Commonly known as: FLOMAX Take 1 capsule (0.4 mg total) by mouth daily.        Consultations: Gastroenterology  Procedures/Studies: 9/14-EGD showed LA grade D esophagitis with no bleeding (biopsied) 2 cm hiatal hernia, gastritis, normal duodenum and no ulcer   DG CHEST PORT 1 VIEW  Result Date: 10/29/2021 CLINICAL DATA:  Short of breath. Pleural effusion. Follow-up study. EXAM: PORTABLE CHEST 1 VIEW COMPARISON:  10/26/2021 and older exams.  CT, 10/21/2021. FINDINGS: Bilateral lung base opacities obscure the hemidiaphragms consistent with a combination of small to moderate effusions and atelectasis. Perihilar so opacities noted on the previous day's study have improved, which may be due to more wrecked positioning on the current exam. No convincing pulmonary edema. Underlying changes of COPD/emphysema, stable. No pneumothorax. IMPRESSION: 1. No convincing change from the most recent prior exam. 2. Small to moderate bilateral effusions with associated lung base opacities, likely atelectasis. Consider a component of pneumonia if there are consistent clinical findings. 3. Underlying COPD/emphysema.  No convincing pulmonary edema. Electronically Signed   By: Lajean Manes M.D.   On: 10/29/2021 10:53   DG CHEST PORT 1 VIEW  Result Date:  10/26/2021 CLINICAL DATA:  Pneumonia. EXAM: PORTABLE CHEST 1 VIEW COMPARISON:  October 24, 2021. FINDINGS: Stable cardiomegaly. Increased bibasilar atelectasis or edema is noted with associated pleural effusions. Bony thorax is unremarkable. IMPRESSION: Increased bibasilar atelectasis or edema is noted with associated pleural effusions. Electronically Signed   By: Marijo Conception  M.D.   On: 10/26/2021 09:55   DG Chest Right Decubitus  Result Date: 10/24/2021 CLINICAL DATA:  Shortness of breath, chest pain. EXAM: CHEST - RIGHT DECUBITUS COMPARISON:  Same day. FINDINGS: Right decubitus view demonstrates small free-flowing right pleural effusion. IMPRESSION: Small free flowing right pleural effusion is noted. Electronically Signed   By: Marijo Conception M.D.   On: 10/24/2021 16:11   DG CHEST PORT 1 VIEW  Result Date: 10/24/2021 CLINICAL DATA:  Shortness of breath EXAM: PORTABLE CHEST 1 VIEW COMPARISON:  Chest radiograph August 29 23 FINDINGS: Stable cardiac and mediastinal contours. Aortic athero sclerosis. Interval development of opacification within the right lower hemithorax which may represent combination of pleural fluid, lobar atelectasis and underlying pulmonary parenchymal consolidation. Small left pleural effusion with underlying consolidation. No pneumothorax. Lumbar spinal fusion hardware. IMPRESSION: New large area of consolidation right lower hemithorax may represent combination of lobar atelectasis and pleural fluid. Recommend short-term follow-up chest radiograph. Small left effusion and underlying opacities. Electronically Signed   By: Lovey Newcomer M.D.   On: 10/24/2021 10:59   CT Angio Chest PE W and/or Wo Contrast  Result Date: 10/21/2021 CLINICAL DATA:  87 year old with history of worsening RIGHT-sided pleuritic chest pain presents for evaluation for pulmonary embolism. EXAM: CT ANGIOGRAPHY CHEST WITH CONTRAST TECHNIQUE: Multidetector CT imaging of the chest was performed using the standard  protocol during bolus administration of intravenous contrast. Multiplanar CT image reconstructions and MIPs were obtained to evaluate the vascular anatomy. RADIATION DOSE REDUCTION: This exam was performed according to the departmental dose-optimization program which includes automated exposure control, adjustment of the mA and/or kV according to patient size and/or use of iterative reconstruction technique. CONTRAST:  60m OMNIPAQUE IOHEXOL 350 MG/ML SOLN COMPARISON:  August 15, 2021. FINDINGS: Cardiovascular: Calcified aortic atherosclerosis also with noncalcified atherosclerotic plaque. Normal caliber of the thoracic aorta without acute process. Heart size is top normal. No pericardial effusion or nodularity. Three-vessel coronary artery disease. Extensive coronary artery calcifications. Central pulmonary vasculature opacified to 236 Hounsfield units, moderate study quality. The study is negative for pulmonary embolism to the segmental level. Subsegmental branches are not well evaluated due to bolus timing Mediastinum/Nodes: No acute process or signs of adenopathy in the mediastinum. Lungs/Pleura: Signs of pulmonary emphysema. No pneumothorax. Pulmonary emphysema is moderate to marked and worse at the lung apices. Small LEFT effusion and LEFT basilar airspace disease. Material in LEFT lower lobe bronchi and mild bronchial wall thickening. Small Bochdalek's hernia containing fat on the RIGHT. Basilar atelectasis mixed with airspace disease and material in RIGHT lower lobe bronchi as well. Bronchial wall thickening to the RIGHT lung base. Upper Abdomen: Incidental imaging of upper abdominal contents without acute process. Musculoskeletal: Signs of spinal fusion. Osteopenia. Spinal fusion in the upper lumbar spine is incompletely assessed. Subacute rib fractures in the bilateral chest without change compared to previous imaging. Visualized clavicles and scapulae as well as the sternum are unremarkable. Signs of recent  compression fractures in the thoracic spine which have occurred since August 15, 2021 showing T5 with 50-60% loss of height along the anterior endplate. No signs of central canal impingement or posterior retropulsion of fracture elements. Sclerosis at this level suggests this may be subacute. Inferior endplate of T7 is fractured leading to approximately 40% loss of height at this level. Kyphotic angulation at the thoracolumbar transition is similar to previous imaging. This area and the fusion are incompletely assessed. Review of the MIP images confirms the above findings. IMPRESSION: 1. No evidence of  pulmonary embolism to the segmental level. 2. New T5 and T7 compression fractures. T5 may be subacute but both appear recent and are new since previous imaging. Correlate with pain in these areas. 3. Bilateral subacute rib fractures with similar appearance to recent imaging. 4. Bibasilar airspace disease and material within lower lobe bronchi suspicious for developing aspiration related pneumonia. 5. Pulmonary emphysema and aortic atherosclerosis. 6. Three-vessel coronary artery disease. Aortic Atherosclerosis (ICD10-I70.0) and Emphysema (ICD10-J43.9). Electronically Signed   By: Zetta Bills M.D.   On: 10/21/2021 18:44   VAS [place] LOWER EXTREMITY VENOUS (DVT) (ONLY [facility] & [facility])  Result Date: 10/21/2021  Lower Venous DVT Study Patient Name:  TAHEEM FRICKE  Date of Exam:   10/21/2021 Medical Rec #: [id number]         Accession #:    [id number] Date of Birth: 15-Feb-1935        Patient Gender: M Patient Age:   34 years Exam Location:  [facility] Procedure:      VAS [place] LOWER EXTREMITY VENOUS (DVT) Referring Phys: Marda Stalker --------------------------------------------------------------------------------  Indications: Pain.  Comparison Study: 03/08/21 prior Performing Technologist: Archie Patten RVS  Examination Guidelines: A complete evaluation includes B-mode imaging, spectral Doppler, color Doppler, and  power Doppler as needed of all accessible portions of each vessel. Bilateral testing is considered an integral part of a complete examination. Limited examinations for reoccurring indications may be performed as noted. The reflux portion of the exam is performed with the patient in reverse Trendelenburg.  +-----+---------------+---------+-----------+----------+--------------+ RIGHTCompressibilityPhasicitySpontaneityPropertiesThrombus Aging +-----+---------------+---------+-----------+----------+--------------+ CFV  Full           Yes      Yes                                 +-----+---------------+---------+-----------+----------+--------------+   +---------+---------------+---------+-----------+----------+--------------+ LEFT     CompressibilityPhasicitySpontaneityPropertiesThrombus Aging +---------+---------------+---------+-----------+----------+--------------+ CFV      Full           Yes      Yes                                 +---------+---------------+---------+-----------+----------+--------------+ SFJ      Full                                                        +---------+---------------+---------+-----------+----------+--------------+ FV Prox  Full                                                        +---------+---------------+---------+-----------+----------+--------------+ FV Mid   Full                                                        +---------+---------------+---------+-----------+----------+--------------+ FV DistalFull                                                        +---------+---------------+---------+-----------+----------+--------------+  PFV      Full                                                        +---------+---------------+---------+-----------+----------+--------------+ POP      Full           Yes      Yes                                  +---------+---------------+---------+-----------+----------+--------------+ PTV      Full                                                        +---------+---------------+---------+-----------+----------+--------------+ PERO     Full                                                        +---------+---------------+---------+-----------+----------+--------------+     Summary: RIGHT: - No evidence of common femoral vein obstruction.  LEFT: - There is no evidence of deep vein thrombosis in the lower extremity.  - No cystic structure found in the popliteal fossa.  *See table(s) above for measurements and observations. Electronically signed by Deitra Mayo MD on 10/21/2021 at 4:55:40 PM.    Final    DG Chest Portable 1 View  Result Date: 10/21/2021 CLINICAL DATA:  Chest pain short of breath EXAM: PORTABLE CHEST 1 VIEW COMPARISON:  Chest 09/28/2021 FINDINGS: COPD with hyperinflation. Progression of bibasilar airspace disease. Possible pneumonia versus atelectasis Negative for heart failure.  Small left effusion IMPRESSION: COPD with bibasilar airspace disease, possible pneumonia. Small left pleural effusion. Electronically Signed   By: Franchot Gallo M.D.   On: 10/21/2021 16:21       The results of significant diagnostics from this hospitalization (including imaging, microbiology, ancillary and laboratory) are listed below for reference.     Microbiology: Recent Results (from the past 240 hour(s))  Resp Panel by RT-PCR (Flu A&B, Covid) Anterior Nasal Swab     Status: Abnormal   Collection Time: 11/05/21  8:54 PM   Specimen: Anterior Nasal Swab  Result Value Ref Range Status   SARS Coronavirus 2 by RT PCR POSITIVE (A) NEGATIVE Final    Comment: (NOTE) SARS-CoV-2 target nucleic acids are DETECTED.  The SARS-CoV-2 RNA is generally detectable in upper respiratory specimens during the acute phase of infection. Positive results are indicative of the presence of the identified virus,  but do not rule out bacterial infection or co-infection with other pathogens not detected by the test. Clinical correlation with patient history and other diagnostic information is necessary to determine patient infection status. The expected result is Negative.  Fact Sheet for Patients: EntrepreneurPulse.com.au  Fact Sheet for Healthcare Providers: IncredibleEmployment.be  This test is not yet approved or cleared by the [place] FDA and  has been authorized for detection and/or diagnosis of SARS-CoV-2 by FDA under an Emergency Use Authorization (EUA).  This EUA will remain in  effect (meaning this test can be used) for the duration of  the COVID-19 declaration under Section 564(b)(1) of the A ct, 21 U.S.C. section 360bbb-3(b)(1), unless the authorization is terminated or revoked sooner.     Influenza A by PCR NEGATIVE NEGATIVE Final   Influenza B by PCR NEGATIVE NEGATIVE Final    Comment: (NOTE) The Xpert Xpress SARS-CoV-2/FLU/RSV plus assay is intended as an aid in the diagnosis of influenza from Nasopharyngeal swab specimens and should not be used as a sole basis for treatment. Nasal washings and aspirates are unacceptable for Xpert Xpress SARS-CoV-2/FLU/RSV testing.  Fact Sheet for Patients: EntrepreneurPulse.com.au  Fact Sheet for Healthcare Providers: IncredibleEmployment.be  This test is not yet approved or cleared by the [place] FDA and has been authorized for detection and/or diagnosis of SARS-CoV-2 by FDA under an Emergency Use Authorization (EUA). This EUA will remain in effect (meaning this test can be used) for the duration of the COVID-19 declaration under Section 564(b)(1) of the Act, 21 U.S.C. section 360bbb-3(b)(1), unless the authorization is terminated or revoked.  Performed at [facility], [place] [address] [place], [place] [address]      Labs:  CBC: Recent Labs   Lab 11/05/21 1750 11/06/21 0533  WBC 17.9* 12.2*  NEUTROABS 16.4* 9.7*  HGB 12.3* 10.4*  HCT 38.6* 32.5*  MCV 96.5 95.9  PLT 422* 327   BMP &GFR Recent Labs  Lab 11/05/21 1750  NA 137  K 4.6  CL 97*  CO2 26  GLUCOSE 123*  BUN 16  CREATININE 3.00*  CALCIUM 8.9   Estimated Creatinine Clearance: 18.9 mL/min (A) (by C-G formula based on SCr of 3 mg/dL (H)). Liver & Pancreas: Recent Labs  Lab 11/05/21 1750  AST 21  ALT 21  ALKPHOS 44  BILITOT 1.1  PROT 7.1  ALBUMIN 3.2*   No results for input(s): "LIPASE", "AMYLASE" in the last 168 hours. No results for input(s): "AMMONIA" in the last 168 hours. Diabetic: No results for input(s): "HGBA1C" in the last 72 hours. No results for input(s): "GLUCAP" in the last 168 hours. Cardiac Enzymes: No results for input(s): "CKTOTAL", "CKMB", "CKMBINDEX", "TROPONINI" in the last 168 hours. No results for input(s): "PROBNP" in the last 8760 hours. Coagulation Profile: Recent Labs  Lab 11/05/21 1750  INR 1.2   Thyroid Function Tests: No results for input(s): "TSH", "T4TOTAL", "FREET4", "T3FREE", "THYROIDAB" in the last 72 hours. Lipid Profile: No results for input(s): "CHOL", "HDL", "LDLCALC", "TRIG", "CHOLHDL", "LDLDIRECT" in the last 72 hours. Anemia Panel: No results for input(s): "VITAMINB12", "FOLATE", "FERRITIN", "TIBC", "IRON", "RETICCTPCT" in the last 72 hours. Urine analysis:    Component Value Date/Time   COLORURINE YELLOW 05/01/2021 1446   APPEARANCEUR TURBID (A) 05/01/2021 1446   LABSPEC 1.025 05/01/2021 1446   PHURINE 5.0 05/01/2021 1446   GLUCOSEU NEGATIVE 05/01/2021 1446   HGBUR LARGE (A) 05/01/2021 1446   BILIRUBINUR NEGATIVE 05/01/2021 1446   KETONESUR 5 (A) 05/01/2021 1446   PROTEINUR >=300 (A) 05/01/2021 1446   NITRITE NEGATIVE 05/01/2021 1446   LEUKOCYTESUR MODERATE (A) 05/01/2021 1446   Sepsis Labs: Invalid input(s): "PROCALCITONIN", "LACTICIDVEN"   SIGNED:  Mercy Riding, MD  [facility] 11/06/2021, 1:58 PM

## 2021-11-06 NOTE — Transfer of Care (Signed)
Immediate Anesthesia Transfer of Care Note  Patient: Wayne Meadows  Procedure(s) Performed: ESOPHAGOGASTRODUODENOSCOPY (EGD) WITH PROPOFOL BIOPSY  Patient Location: Endoscopy Unit  Anesthesia Type:MAC  Level of Consciousness: awake, alert  and oriented  Airway & Oxygen Therapy: Patient Spontanous Breathing and Patient connected to nasal cannula oxygen  Post-op Assessment: Report given to RN, Post -op Vital signs reviewed and stable and Patient moving all extremities  Post vital signs: Reviewed and stable  Last Vitals:  Vitals Value Taken Time  BP 137/48 11/06/21 1252  Temp 36.3 C 11/06/21 1252  Pulse 77 11/06/21 1252  Resp 14 11/06/21 1252  SpO2 100 % 11/06/21 1252    Last Pain:  Vitals:   11/06/21 1252  TempSrc: Oral  PainSc:       Patients Stated Pain Goal: 2 (79/48/01 6553)  Complications: No notable events documented.

## 2021-11-06 NOTE — Progress Notes (Signed)
Pt to return to snf today. Pt receives out-pt HD at [facility]) on MWF. Pt arrives at 12:15 for 12:35 chair time. Contacted clinic and spoke to [place]. Staff aware of pt's d/c today and that pt will resume care tomorrow. Clinic aware of pt's covid diagnosis and has proper protocol in place.   Melven Sartorius Renal Navigator [phone]

## 2021-11-06 NOTE — Consult Note (Addendum)
Attending physician's note   I have taken a history, reviewed the chart, and examined the patient. I performed a substantive portion of this encounter, including complete performance of at least one of the key components, in conjunction with the APP. I agree with the APP's note, impression, and recommendations with my edits.   87 year old male with medical history as outlined below, presents with hematemesis.  Initially had episode of nonbloody emesis, then vomiting dark material that he felt was "old blood".  Admission hemoglobin largely stable from baseline.  Hemoglobin was 10.8 yesterday at dialysis, then 12.3 on arrival, which was likely a function of hemoconcentration.  Repeat 10.4 today.  Baseline appears to be ~10).  Additionally, recently tested positive for COVID on 11/04/2021.  Did have hospital admission for UGIB in 03/2021.  EGD on 04/08/2021 with 2 cm segment of nondysplastic Barrett's Esophagus, 2 superficial gastric ulcers (path: Gastritis, no H. pylori), 3 to Diel ulcers in the bulb/C-sweep measuring up to 6 mm.  Was treated with high-dose PPI.  Did receive 3 unit PRBCs on that admission.  1) Hematemesis 2) History of gastric ulcers 3) History of duodenal ulcers - Plan for expedited EGD today for diagnostic and therapeutic intent - Continue serial CBC checks - Continue IV PPI - No need for RBC transfusion at this juncture  4) History of Barrett's Esophagus - Can reevaluate during endoscopy today.  Otherwise had planned for repeat upper endoscopy at 1 year for surveillance  5) ESRD on HD - HD MWF per Nephrology service  6) COVID - Management per primary Hospitalist service   [place], [place], [place] [phone] office          Consultation  Referring Provider: Dr. Cyndia Skeeters      Primary Care Physician:  Clinic, [name] Primary Gastroenterologist: Dr. Silverio Decamp       Reason for Consultation: GI bleed            HPI:   Wayne Meadows is a 87 y.o. male  with a past medical history of hypertension, CAD status post NSTEMI with RCA stent 2012, chronic hypoxic respiratory failure on O2 at 2.5 L/min, COPD, CVA, diabetes type 2, renal cell carcinoma status postresection in 2011, pancreatitis, pancreatic lesion, colon perforation status post partial colectomy in 2013, ESRD on HD , diverticulosis and multiple others, who presented to the ED on 11/05/2021 with complaint of GI bleed.    Today, patient explains that yesterday sometime after dinner he had an episode of vomiting, but it did not have any blood in it.  He then went to dialysis yesterday and almost immediately after started vomiting "old blood" per him.  He has had no further hematemesis since being admitted to the hospital.  Tells me he is not sure if he has been on his Pantoprazole but "they give me what ever I am supposed to have" at his nursing facility.      Apparently recently diagnosed with COVID on 11/04/2021 at [facility].    Denies fever, chills, weight loss, change in bowel habits, abdominal pain, heartburn or reflux.  ER course: Hemoglobin stable, no further episodes of hematemesis  GI history: 04/06/2021 consult by our service for anemia and GI bleed: At that admission underwent EGD as below  EGD 04/08/2021 with salmon-colored mucosa, nonbleeding gastric ulcers, nonbleeding duodenal ulcers and normal second portion of duodenum, continued on Pantoprazole 40 twice daily   EGD 06/21/2020: 1. Incidental esophageal stricture 2. Otherwise essentially normal EGD. Possibly mildly congested  proximal esophageal mucosa as described.   Colonoscopy 08/22/2015: - Preparation of the colon was inadequate. - Two 5 to 7 mm polyps in the transverse colon, removed with a cold snare. Resected and retrieved. - Two 8 to 12 mm polyps in the rectum and in the transverse colon, removed with a hot snare. Resected and retrieved. - Granularity in the sigmoid colon. Biopsied. - Diverticulosis in the sigmoid  colon, in the descending colon and in the transverse colon. - Non-bleeding internal hemorrhoids. -No further colonoscopies recommended due to age 38. Colon, polyp(s), Transverse - TUBULAR ADENOMA, 2 FRAGMENTS. - HYPERPLASTIC POLYP, 1 FRAGMENT. - NO HIGH GRADE DYSPLASIA OR MALIGNANCY IDENTIFIED. 2. Colon, biopsy, Sigmoid - CHRONIC ACTIVE COLITIS. - NO DYSPLASIA OR MALIGNANCY IDENTIFIED.  Past Medical History:  Diagnosis Date   Abnormal echocardiogram 03/08/2021   Abnormal finding on GI tract imaging    Acute blood loss anemia    Acute pancreatitis 08/12/2020   Arteriovenous fistula infection ([place]) 04/18/2021   Arthritis    Carotid stenosis    Community acquired pneumonia 04/06/2021   COPD (chronic obstructive pulmonary disease) ([place])    Coronary artery disease    S/p PCI 2011;  NSTEMI 12/12:  LHC/PCI 02/23/11: LAD 60% after the septal perforator, D1 occluded with distal collaterals, proximal RI 30-40%, AV circumflex stent patent with 60% stenosis after the stent, RCA 99%, EF 60-65%.  His RCA was treated with a bare-metal stent   CVA (cerebral infarction) 2011   Right cerebral; total obstruction of the right ICA   Diverticulitis    Hypertension    Paroxysmal atrial fibrillation with RVR ([place]) 04/09/2021   Partial small bowel obstruction ([place]) 08/17/2021   Pleuritic chest pain 04/08/2014   Pressure injury of skin 03/30/2021   Rash 03/05/2021   Rectal bleeding 07/2015   Refractory nausea and vomiting 06/20/2020   Renal carcinoma ([place])    Sepsis, unspecified organism ([place]) 04/11/2016   Stroke ([place])    Tobacco abuse, in remission    Unstable angina ([place]) 03/18/2011    Past Surgical History:  Procedure Laterality Date   AV FISTULA PLACEMENT Right 04/03/2021   Procedure: ARTERIOVENOUS (AV) CREATION OF RIGHT ARM BRACHIOCEPHALIC FISTULA;  Surgeon: Cherre Robins, MD;  Location: [facility];  Service: Vascular;  Laterality: Right;   BIOPSY  04/08/2021   Procedure: BIOPSY;  Surgeon: Lavena Bullion,  DO;  Location: [facility];  Service: Gastroenterology;;   COLON SURGERY     COLONOSCOPY N/A 08/22/2015   Procedure: COLONOSCOPY;  Surgeon: [name], MD;  Location: [facility];  Service: Endoscopy;  Laterality: N/A;   ESOPHAGOGASTRODUODENOSCOPY N/A 07/18/2020   Procedure: ESOPHAGOGASTRODUODENOSCOPY (EGD);  Surgeon: Milus Banister, MD;  Location: [facility];  Service: Endoscopy;  Laterality: N/A;   ESOPHAGOGASTRODUODENOSCOPY (EGD) WITH PROPOFOL N/A 06/21/2020   Procedure: ESOPHAGOGASTRODUODENOSCOPY (EGD) WITH PROPOFOL;  Surgeon: Irene Shipper, MD;  Location: [facility] ENDOSCOPY;  Service: Endoscopy;  Laterality: N/A;   ESOPHAGOGASTRODUODENOSCOPY (EGD) WITH PROPOFOL N/A 04/08/2021   Procedure: ESOPHAGOGASTRODUODENOSCOPY (EGD) WITH PROPOFOL;  Surgeon: Lavena Bullion, DO;  Location: [facility];  Service: Gastroenterology;  Laterality: N/A;   EUS N/A 07/18/2020   Procedure: UPPER ENDOSCOPIC ULTRASOUND (EUS) RADIAL;  Surgeon: Milus Banister, MD;  Location: [facility];  Service: Endoscopy;  Laterality: N/A;   FINE NEEDLE ASPIRATION N/A 07/18/2020   Procedure: FINE NEEDLE ASPIRATION (FNA) LINEAR;  Surgeon: Milus Banister, MD;  Location: [facility];  Service: Endoscopy;  Laterality: N/A;   HEMOSTASIS CLIP PLACEMENT  04/08/2021  Procedure: HEMOSTASIS CLIP PLACEMENT;  Surgeon: Lavena Bullion, DO;  Location: [facility];  Service: Gastroenterology;;   hip relacement     INCISION AND DRAINAGE Right 09/11/2021   Procedure: INCISION AND DRAINAGE OF RIGHT ARM FISTULA;  Surgeon: Cherre Robins, MD;  Location: [facility] OR;  Service: Vascular;  Laterality: Right;   IR FLUORO GUIDE CV LINE RIGHT  03/31/2021   IR REMOVAL TUN CV CATH W/O FL  08/15/2021   IR US GUIDE VASC ACCESS RIGHT  03/31/2021   KIDNEY SURGERY     LEFT HEART CATH AND CORONARY ANGIOGRAPHY N/A 07/09/2020   Procedure: LEFT HEART CATH AND CORONARY ANGIOGRAPHY;  Surgeon: Leonie Man, MD;  Location: [facility] CV LAB;  Service:  Cardiovascular;  Laterality: N/A;   LEFT HEART CATHETERIZATION WITH CORONARY ANGIOGRAM N/A 02/23/2011   Procedure: LEFT HEART CATHETERIZATION WITH CORONARY ANGIOGRAM;  Surgeon: [name], MD;  Location: [facility] CATH LAB;  Service: Cardiovascular;  Laterality: N/A;   LEFT HEART CATHETERIZATION WITH CORONARY ANGIOGRAM N/A 03/18/2011   Procedure: LEFT HEART CATHETERIZATION WITH CORONARY ANGIOGRAM;  Surgeon: [name], MD;  Location: [facility] C[facility] LAB;  Service: Cardiovascular;  Laterality: N/A;   PERCUTANEOUS CORONARY STENT INTERVENTION (PCI-S) N/A 02/23/2011   Procedure: PERCUTANEOUS CORONARY STENT INTERVENTION (PCI-S);  Surgeon: [name], MD;  Location: [facility] CATH LAB;  Service: Cardiovascular;  Laterality: N/A;   TEMPORARY PACEMAKER INSERTION N/A 02/23/2011   Procedure: TEMPORARY PACEMAKER INSERTION;  Surgeon: [name], MD;  Location: [facility] LAB;  Service: Cardiovascular;  Laterality: N/A;   THROMBECTOMY W/ EMBOLECTOMY Right 04/03/2021   Procedure: THROMBECTOMY ARTERIOVENOUS FISTULA;  Surgeon: Cherre Robins, MD;  Location: [facility] OR;  Service: Vascular;  Laterality: Right;    Family History  Problem Relation Age of Onset   Heart attack Other 42    Social History   Tobacco Use   Smoking status: Former    Types: Cigarettes    Quit date: 02/24/2007    Years since quitting: 14.7   Smokeless tobacco: Never  Vaping Use   Vaping Use: Never used  Substance Use Topics   Alcohol use: Not Currently    Alcohol/week: 1.0 standard drink of alcohol    Types: 1 Standard drinks or equivalent per week    Comment: socially    Drug use: No    Prior to Admission medications   Medication Sig Start Date End Date Taking? Authorizing Provider  acetaminophen (TYLENOL) 325 MG tablet Take 2 tablets (650 mg total) by mouth every 6 (six) hours as needed for mild pain (or Fever >/= 101). 04/10/21  Yes Bonnielee Haff, MD  Amino Acids-Protein Hydrolys (PRO-STAT SUGAR FREE PO) Take 30 mLs by mouth every  evening.   Yes [provider]  aspirin EC 81 MG tablet Take 1 tablet (81 mg total) by mouth daily. Swallow whole. 10/31/21  Yes Lavina Hamman, MD  benzonatate (TESSALON) 100 MG capsule Take 1 capsule (100 mg total) by mouth 3 (three) times daily. 10/30/21  Yes Lavina Hamman, MD  busPIRone (BUSPAR) 5 MG tablet Take 1 tablet (5 mg total) by mouth 3 (three) times daily. (0800 & 2000) Patient taking differently: Take 5 mg by mouth 2 (two) times daily. (0800 & 2000) 10/30/21  Yes Lavina Hamman, MD  Darbepoetin Alfa (ARANESP) 60 MCG/0.3ML SOSY injection Inject 0.3 mLs (60 mcg total) into the vein every Monday with hemodialysis. 09/30/21  Yes Cherene Altes, MD  dextromethorphan-guaiFENesin [facility] DM) 30-600 MG 12hr  tablet Take 1 tablet by mouth 2 (two) times daily. 10/30/21  Yes Lavina Hamman, MD  diltiazem (CARDIZEM CD) 240 MG 24 hr capsule Take 1 capsule (240 mg total) by mouth daily. 10/30/21  Yes Lavina Hamman, MD  ferrous sulfate 325 (65 FE) MG tablet Take 325 mg by mouth every Monday, Wednesday, and Friday.   Yes [provider]  guaiFENesin (ROBITUSSIN) 100 MG/5ML liquid Take 10 mLs by mouth every 4 (four) hours as needed for cough or to loosen phlegm. 10/30/21  Yes Lavina Hamman, MD  hydrALAZINE (APRESOLINE) 10 MG tablet Take 10 mg by mouth 3 (three) times daily as needed (for SBP greater than 180).   Yes [provider]  HYDROcodone-acetaminophen (NORCO) 10-325 MG tablet Take 1 tablet by mouth every 4 (four) hours as needed for severe pain or moderate pain. 10/30/21  Yes Lavina Hamman, MD  Ipratropium-Albuterol (COMBIVENT) 20-100 MCG/ACT AERS respimat Inhale 1 puff into the lungs 4 (four) times daily. (0900, 1300, 1700 & 2100)   Yes [provider]  ipratropium-albuterol (DUONEB) 0.5-2.5 (3) MG/3ML SOLN Take 3 mLs by nebulization every 4 (four) hours as needed (shortness of breath/wheezing).   Yes [provider]  levothyroxine (SYNTHROID) 50 MCG  tablet Take 50 mcg by mouth daily before breakfast. (0630)   Yes [provider]  LIDOCAINE PAIN RELIEF 4 % Place 1 patch onto the skin daily. Apply 1 patch to back in the morning and remove at bedtime for pain. 10/17/21  Yes [provider]  melatonin 5 MG TABS Take 5 mg by mouth at bedtime. 09/30/21  Yes [provider]  metoprolol tartrate (LOPRESSOR) 25 MG tablet Take 0.5 tablets (12.5 mg total) by mouth 2 (two) times daily. 10/30/21  Yes Lavina Hamman, MD  naphazoline-glycerin (CLEAR EYES REDNESS) 0.012-0.25 % SOLN Place 1-2 drops into the right eye 3 (three) times daily. 04/23/21  Yes Alma Friendly, MD  neomycin-bacitracin-polymyxin (NEOSPORIN) OINT Apply 1 application topically 3 (three) times daily. To right antecubital area 04/10/21  Yes Bonnielee Haff, MD  Nutritional Supplements (NOVASOURCE RENAL) LIQD Take 237 mLs by mouth in the morning and at bedtime. (0800 & 1700)   Yes [provider]  pantoprazole (PROTONIX) 40 MG tablet Take 1 tablet (40 mg total) by mouth daily for 14 days. 10/30/21 11/13/21 Yes Lavina Hamman, MD  polyethylene glycol (MIRALAX / GLYCOLAX) 17 g packet Take 17 g by mouth daily. 05/01/21  Yes Sherwood Gambler, MD  pravastatin (PRAVACHOL) 40 MG tablet Take 40 mg by mouth at bedtime. (2100)   Yes [provider]  senna (SENOKOT) 8.6 MG tablet Take 2 tablets by mouth at bedtime. 10/15/21  Yes [provider]  sevelamer carbonate (RENVELA) 800 MG tablet Take 1,600 mg by mouth 3 (three) times daily with meals.   Yes [provider]  tamsulosin (FLOMAX) 0.4 MG CAPS capsule Take 1 capsule (0.4 mg total) by mouth daily. 04/11/21  Yes Bonnielee Haff, MD  Menthol, Topical Analgesic, (BIOFREEZE) 4 % GEL Apply 1 Application topically 3 (three) times daily as needed (pain). Patient not taking: Reported on 10/21/2021    [provider]  multivitamin (RENA-VIT) TABS tablet Take 1 tablet by mouth at bedtime. 10/30/21    Lavina Hamman, MD    Current Facility-Administered Medications  Medication Dose Route Frequency Provider Last Rate Last Admin   acetaminophen (TYLENOL) tablet 650 mg  650 mg Oral Q6H PRN Kristopher Oppenheim, DO   650 mg  at 11/05/21 2351   Or   acetaminophen (TYLENOL) suppository 650 mg  650 mg Rectal Q6H PRN Kristopher Oppenheim, DO       busPIRone (BUSPAR) tablet 5 mg  5 mg Oral TID Kristopher Oppenheim, DO   5 mg at 11/06/21 0953   diltiazem (CARDIZEM CD) 24 hr capsule 240 mg  240 mg Oral Daily Kristopher Oppenheim, DO   240 mg at 11/06/21 3329   Ipratropium-Albuterol (COMBIVENT) respimat 1 puff  1 puff Inhalation QID Kristopher Oppenheim, DO   1 puff at 11/06/21 0953   levothyroxine (SYNTHROID) tablet 50 mcg  50 mcg Oral Q0600 Kristopher Oppenheim, DO   50 mcg at 11/06/21 0525   metoprolol tartrate (LOPRESSOR) tablet 12.5 mg  12.5 mg Oral BID Kristopher Oppenheim, DO   12.5 mg at 11/06/21 0953   ondansetron (ZOFRAN) tablet 4 mg  4 mg Oral Q6H PRN Kristopher Oppenheim, DO       Or   ondansetron [facility]) injection 4 mg  4 mg Intravenous Q6H PRN Kristopher Oppenheim, DO       pantoprozole (PROTONIX) 80 mg /NS 100 mL infusion  8 mg/hr Intravenous Continuous Kristopher Oppenheim, DO 10 mL/hr at 11/06/21 0953 8 mg/hr at 11/06/21 0953   [START ON 11/07/2021] pneumococcal 20-valent conjugate vaccine (PREVNAR 20) injection 0.5 mL  0.5 mL Intramuscular Tomorrow-1000 Velna Ochs, MD        Allergies as of 11/05/2021 - Review Complete 11/05/2021  Allergen Reaction Noted   Bee venom Anaphylaxis 02/22/2011   Influenza vaccines Other (See Comments) 07/13/2013     Review of Systems:    Constitutional: No weight loss, fever or chills Skin: No rash Cardiovascular: No chest pain Respiratory: +chronic SOB Gastrointestinal: See HPI and otherwise negative Genitourinary: No dysuria Neurological: No headache, dizziness or syncope Musculoskeletal: No new muscle or joint pain Hematologic: No bruising Psychiatric: No history of depression or anxiety    Physical Exam:  Vital signs in last 24  hours: Temp:  [98.6 F (37 C)-98.9 F (37.2 C)] 98.6 F (37 C) (09/14 0802) Pulse Rate:  [82-99] 82 (09/14 0802) Resp:  [13-20] 16 (09/14 0802) BP: (138-156)/(45-61) 149/61 (09/14 0802) SpO2:  [97 %-100 %] 98 % (09/14 0802)   General:   Pleasant Elderly Caucasian male appears to be in NAD, Well developed, Well nourished, alert and cooperative Head:  Normocephalic and atraumatic. Eyes:   PEERL, EOMI. No icterus. Conjunctiva pink. Ears:  Normal auditory acuity. Neck:  Supple Throat: Oral cavity and pharynx without inflammation, swelling or lesion. Lungs: Respirations even and unlabored. Lungs clear to auscultation bilaterally.   No wheezes, crackles, or rhonchi.  Heart: Normal S1, S2. No MRG. Regular rate and rhythm. No peripheral edema, cyanosis or pallor.  Abdomen:  Soft, nondistended, mild generalized ttp,  No rebound or guarding. Normal bowel sounds. No appreciable masses or hepatomegaly. Rectal:  Not performed.  Msk:  Symmetrical without gross deformities. Peripheral pulses intact.  Extremities:  Without edema, no deformity or joint abnormality.  Neurologic:  Alert and  oriented x4;  grossly normal neurologically.  Skin:   Dry and intact without significant lesions or rashes. Psychiatric: Demonstrates good judgement and reason without abnormal affect or behaviors.   LAB RESULTS: Recent Labs    11/05/21 1750 11/06/21 0533  WBC 17.9* 12.2*  HGB 12.3* 10.4*  HCT 38.6* 32.5*  PLT 422* 327   BMET Recent Labs    11/05/21 1750  NA 137  K 4.6  CL 97*  CO2 26  GLUCOSE  123*  BUN 16  CREATININE 3.00*  CALCIUM 8.9   LFT Recent Labs    11/05/21 1750  PROT 7.1  ALBUMIN 3.2*  AST 21  ALT 21  ALKPHOS 44  BILITOT 1.1   PT/INR Recent Labs    11/05/21 1750  LABPROT 14.6  INR 1.2     Impression / Plan:   Impression: 1.  Upper GI bleed: Started with hematemesis 11/05/2021 with 1 or 2 episodes after dialysis, none overnight, hemoglobin 12.3--> 10.4, last EGD in  February with nonbleeding gastric and duodenal ulcers, no NSAID history, no blood thinners; question of PUD versus other 2.  ESRD on HD Monday Wednesday Friday 3.  A-fib: Not on systemic anticoagulation due to fall risk aspirin on hold 4.  COPD with chronic respiratory failure on O2 via nasal cannula 5.  COVID-19  Plan: 1.  [place] for EGD this afternoon 2:30 with Dr. Bryan Lemma. Discussed risks, benefits, limitations and alternatives with patient he agrees to proceed. 2.  Patient remain n.p.o. for now 3.  Continue Pantoprazole infusion for now. 4.  Continue to monitor hemoglobin and transfuse as needed less than 7  Thank you for your kind consultation, we will continue to follow.  Lavone Nian [facility]  11/06/2021, 10:38 AM

## 2021-11-06 NOTE — Progress Notes (Signed)
Report was called to Database administrator at [facility]. All pertinent information was discussed and receiving RN had no questions or concerns related to discharge. All personal belongings will accompany patient upon leaving the unit.

## 2021-11-06 NOTE — Op Note (Signed)
[facility] Patient Name: Wayne Meadows Procedure Date : 11/06/2021 MRN: [id number] Attending MD: Gerrit Heck , MD Date of Birth: 02-09-1935 CSN: [id number] Age: 87 Admit Type: Inpatient Procedure:                Upper GI endoscopy Indications:              Hematemesis, Hx of gastric and duodenal ulcers. Hgb                            stable/at baseline. Providers:                Gerrit Heck, MD, Glori Bickers, RN, Frazier Richards, Technician Referring MD:              Medicines:                Monitored Anesthesia Care Complications:            No immediate complications. Estimated Blood Loss:     Estimated blood loss was minimal. Procedure:                Pre-Anesthesia Assessment:                           - Prior to the procedure, a History and Physical                            was performed, and patient medications and                            allergies were reviewed. The patient's tolerance of                            previous anesthesia was also reviewed. The risks                            and benefits of the procedure and the sedation                            options and risks were discussed with the patient.                            All questions were answered, and informed consent                            was obtained. Prior Anticoagulants: The patient has                            taken no previous anticoagulant or antiplatelet                            agents. ASA Grade Assessment: III - A patient with  severe systemic disease. After reviewing the risks                            and benefits, the patient was deemed in                            satisfactory condition to undergo the procedure.                           After obtaining informed consent, the endoscope was                            passed under direct vision. Throughout the                            procedure, the  patient's blood pressure, pulse, and                            oxygen saturations were monitored continuously. The                            [id number]-H190 ([id number]) Olympus endoscope was introduced                            through the mouth, and advanced to the second part                            of duodenum. The upper GI endoscopy was                            accomplished without difficulty. The patient                            tolerated the procedure well. Scope In: Scope Out: Findings:      LA Grade D (one or more mucosal breaks involving at least 75% of       esophageal circumference) esophagitis with no bleeding was found in the       lower third of the esophagus. Biopsies were taken with a cold forceps       for histology. Estimated blood loss was minimal.      A 2 cm hiatal hernia was present.      Patchy minimal inflammation characterized by congestion (edema) and       erythema was found in the gastric body and in the gastric antrum. This       was previously biopsied and benign.      The examined duodenum was normal. Impression:               - LA Grade D esophagitis with no bleeding. Biopsied.                           - 2 cm hiatal hernia.                           - Gastritis.                           -  Normal examined duodenum.                           - The previously noted gastric and duodenal ulcers                            were not present on this study. Recommendation:           - Return patient to hospital ward for ongoing care.                           - Advance diet as tolerated.                           - Use Protonix (pantoprazole) 40 mg PO BID for 8                            weeks, then reduce to 40 mg daily.                           - Avoid eating close to bedtime. To remain upright                            after eating.                           - Await pathology results.                           - Will plan for outpatient f/u in the GI  clinic                            with plan for repeat EGD in 8 weeks to evaluate for                            appropriate mucosal healing.                           - Cannot use carafate due to ESRD/HD.                           - Inpatient GI service will sign off at this time.                            Please do not hesitate to contact us with                            additional questions or concerns. Procedure Code(s):        --- Professional ---                           [phone], Esophagogastroduodenoscopy, flexible,                            transoral; with biopsy, single or multiple Diagnosis  Code(s):        --- Professional ---                           K20.90, Esophagitis, unspecified without bleeding                           K44.9, Diaphragmatic hernia without obstruction or                            gangrene                           K29.70, Gastritis, unspecified, without bleeding                           K92.0, Hematemesis CPT copyright 2019 American Medical Association. All rights reserved. The codes documented in this report are preliminary and upon coder review may  be revised to meet current compliance requirements. Gerrit Heck, MD 11/06/2021 12:55:15 PM Number of Addenda: 0

## 2021-11-06 NOTE — Care Management Obs Status (Cosign Needed)
[place] NOTIFICATION   Patient Details  Name: STONEWALL DOSS MRN: [id number] Date of Birth: 1934-05-28   Medicare Observation Status Notification Given:  Yes    Curlene Labrum, RN 11/06/2021, 1:48 PM

## 2021-11-06 NOTE — H&P (View-Only) (Signed)
Attending physician's note   I have taken a history, reviewed the chart, and examined the patient. I performed a substantive portion of this encounter, including complete performance of at least one of the key components, in conjunction with the APP. I agree with the APP's note, impression, and recommendations with my edits.   87 year old male with medical history as outlined below, presents with hematemesis.  Initially had episode of nonbloody emesis, then vomiting dark material that he felt was "old blood".  Admission hemoglobin largely stable from baseline.  Hemoglobin was 10.8 yesterday at dialysis, then 12.3 on arrival, which was likely a function of hemoconcentration.  Repeat 10.4 today.  Baseline appears to be ~10).  Additionally, recently tested positive for COVID on 11/04/2021.  Did have hospital admission for UGIB in 03/2021.  EGD on 04/08/2021 with 2 cm segment of nondysplastic Barrett's Esophagus, 2 superficial gastric ulcers (path: Gastritis, no H. pylori), 3 to Diel ulcers in the bulb/C-sweep measuring up to 6 mm.  Was treated with high-dose PPI.  Did receive 3 unit PRBCs on that admission.  1) Hematemesis 2) History of gastric ulcers 3) History of duodenal ulcers - Plan for expedited EGD today for diagnostic and therapeutic intent - Continue serial CBC checks - Continue IV PPI - No need for RBC transfusion at this juncture  4) History of Barrett's Esophagus - Can reevaluate during endoscopy today.  Otherwise had planned for repeat upper endoscopy at 1 year for surveillance  5) ESRD on HD - HD MWF per Nephrology service  6) COVID - Management per primary Hospitalist service   [place], [place], [place] [phone] office          Consultation  Referring Provider: Dr. Cyndia Skeeters      Primary Care Physician:  [name] Primary Gastroenterologist: Dr. Silverio Decamp       Reason for Consultation: GI bleed            HPI:   Wayne Meadows is a 87 y.o. male  with a past medical history of hypertension, CAD status post NSTEMI with RCA stent 2012, chronic hypoxic respiratory failure on O2 at 2.5 L/min, COPD, CVA, diabetes type 2, renal cell carcinoma status postresection in 2011, pancreatitis, pancreatic lesion, colon perforation status post partial colectomy in 2013, ESRD on HD , diverticulosis and multiple others, who presented to the ED on 11/05/2021 with complaint of GI bleed.    Today, patient explains that yesterday sometime after dinner he had an episode of vomiting, but it did not have any blood in it.  He then went to dialysis yesterday and almost immediately after started vomiting "old blood" per him.  He has had no further hematemesis since being admitted to the hospital.  Tells me he is not sure if he has been on his Pantoprazole but "they give me what ever I am supposed to have" at his nursing facility.      Apparently recently diagnosed with COVID on 11/04/2021 at [facility].    Denies fever, chills, weight loss, change in bowel habits, abdominal pain, heartburn or reflux.  ER course: Hemoglobin stable, no further episodes of hematemesis  GI history: 04/06/2021 consult by our service for anemia and GI bleed: At that admission underwent EGD as below  EGD 04/08/2021 with salmon-colored mucosa, nonbleeding gastric ulcers, nonbleeding duodenal ulcers and normal second portion of duodenum, continued on Pantoprazole 40 twice daily   EGD 06/21/2020: 1. Incidental esophageal stricture 2. Otherwise essentially normal EGD. Possibly mildly congested  proximal esophageal mucosa as described.   Colonoscopy 08/22/2015: - Preparation of the colon was inadequate. - Two 5 to 7 mm polyps in the transverse colon, removed with a cold snare. Resected and retrieved. - Two 8 to 12 mm polyps in the rectum and in the transverse colon, removed with a hot snare. Resected and retrieved. - Granularity in the sigmoid colon. Biopsied. - Diverticulosis in the sigmoid  colon, in the descending colon and in the transverse colon. - Non-bleeding internal hemorrhoids. -No further colonoscopies recommended due to age 61. Colon, polyp(s), Transverse - TUBULAR ADENOMA, 2 FRAGMENTS. - HYPERPLASTIC POLYP, 1 FRAGMENT. - NO HIGH GRADE DYSPLASIA OR MALIGNANCY IDENTIFIED. 2. Colon, biopsy, Sigmoid - CHRONIC ACTIVE COLITIS. - NO DYSPLASIA OR MALIGNANCY IDENTIFIED.  Past Medical History:  Diagnosis Date   Abnormal echocardiogram 03/08/2021   Abnormal finding on GI tract imaging    Acute blood loss anemia    Acute pancreatitis 08/12/2020   Arteriovenous fistula infection ([place]) 04/18/2021   Arthritis    Carotid stenosis    Community acquired pneumonia 04/06/2021   COPD (chronic obstructive pulmonary disease) ([place])    Coronary artery disease    S/p PCI 2011;  NSTEMI 12/12:  LHC/PCI 02/23/11: LAD 60% after the septal perforator, D1 occluded with distal collaterals, proximal RI 30-40%, AV circumflex stent patent with 60% stenosis after the stent, RCA 99%, EF 60-65%.  His RCA was treated with a bare-metal stent   CVA (cerebral infarction) 2011   Right cerebral; total obstruction of the right ICA   Diverticulitis    Hypertension    Paroxysmal atrial fibrillation with RVR ([place]) 04/09/2021   Partial small bowel obstruction ([place]) 08/17/2021   Pleuritic chest pain 04/08/2014   Pressure injury of skin 03/30/2021   Rash 03/05/2021   Rectal bleeding 07/2015   Refractory nausea and vomiting 06/20/2020   Renal carcinoma ([place])    Sepsis, unspecified organism ([place]) 04/11/2016   Stroke ([place])    Tobacco abuse, in remission    Unstable angina ([place]) 03/18/2011    Past Surgical History:  Procedure Laterality Date   AV FISTULA PLACEMENT Right 04/03/2021   Procedure: ARTERIOVENOUS (AV) CREATION OF RIGHT ARM BRACHIOCEPHALIC FISTULA;  Surgeon: Cherre Robins, MD;  Location: [facility];  Service: Vascular;  Laterality: Right;   BIOPSY  04/08/2021   Procedure: BIOPSY;  Surgeon: Lavena Bullion,  DO;  Location: [place];  Service: Gastroenterology;;   COLON SURGERY     COLONOSCOPY N/A 08/22/2015   Procedure: COLONOSCOPY;  Surgeon: [name], MD;  Location: [facility]COPY;  Service: Endoscopy;  Laterality: N/A;   ESOPHAGOGASTRODUODENOSCOPY N/A 07/18/2020   Procedure: ESOPHAGOGASTRODUODENOSCOPY (EGD);  Surgeon: Milus Banister, MD;  Location: [facility];  Service: Endoscopy;  Laterality: N/A;   ESOPHAGOGASTRODUODENOSCOPY (EGD) WITH PROPOFOL N/A 06/21/2020   Procedure: ESOPHAGOGASTRODUODENOSCOPY (EGD) WITH PROPOFOL;  Surgeon: Irene Shipper, MD;  Location: [facility] ENDOSCOPY;  Service: Endoscopy;  Laterality: N/A;   ESOPHAGOGASTRODUODENOSCOPY (EGD) WITH PROPOFOL N/A 04/08/2021   Procedure: ESOPHAGOGASTRODUODENOSCOPY (EGD) WITH PROPOFOL;  Surgeon: Lavena Bullion, DO;  Location: [place];  Service: Gastroenterology;  Laterality: N/A;   EUS N/A 07/18/2020   Procedure: UPPER ENDOSCOPIC ULTRASOUND (EUS) RADIAL;  Surgeon: Milus Banister, MD;  Location: [facility];  Service: Endoscopy;  Laterality: N/A;   FINE NEEDLE ASPIRATION N/A 07/18/2020   Procedure: FINE NEEDLE ASPIRATION (FNA) LINEAR;  Surgeon: Milus Banister, MD;  Location: [facility] ENDOSCOPY;  Service: Endoscopy;  Laterality: N/A;   HEMOSTASIS CLIP PLACEMENT  04/08/2021  Procedure: HEMOSTASIS CLIP PLACEMENT;  Surgeon: Lavena Bullion, DO;  Location: [place] ENDOSCOPY;  Service: Gastroenterology;;   hip relacement     INCISION AND DRAINAGE Right 09/11/2021   Procedure: INCISION AND DRAINAGE OF RIGHT ARM FISTULA;  Surgeon: Cherre Robins, MD;  Location: [facility] OR;  Service: Vascular;  Laterality: Right;   IR FLUORO GUIDE CV LINE RIGHT  03/31/2021   IR REMOVAL TUN CV CATH W/O FL  08/15/2021   IR US GUIDE VASC ACCESS RIGHT  03/31/2021   KIDNEY SURGERY     LEFT HEART CATH AND CORONARY ANGIOGRAPHY N/A 07/09/2020   Procedure: LEFT HEART CATH AND CORONARY ANGIOGRAPHY;  Surgeon: Leonie Man, MD;  Location: [facility] CV LAB;  Service:  Cardiovascular;  Laterality: N/A;   LEFT HEART CATHETERIZATION WITH CORONARY ANGIOGRAM N/A 02/23/2011   Procedure: LEFT HEART CATHETERIZATION WITH CORONARY ANGIOGRAM;  Surgeon: [name], MD;  Location: [facility] CATH LAB;  Service: Cardiovascular;  Laterality: N/A;   LEFT HEART CATHETERIZATION WITH CORONARY ANGIOGRAM N/A 03/18/2011   Procedure: LEFT HEART CATHETERIZATION WITH CORONARY ANGIOGRAM;  Surgeon: [name], MD;  Location: [facility] CATH LAB;  Service: Cardiovascular;  Laterality: N/A;   PERCUTANEOUS CORONARY STENT INTERVENTION (PCI-S) N/A 02/23/2011   Procedure: PERCUTANEOUS CORONARY STENT INTERVENTION (PCI-S);  Surgeon: [name], MD;  Location: [facility] CATH LAB;  Service: Cardiovascular;  Laterality: N/A;   TEMPORARY PACEMAKER INSERTION N/A 02/23/2011   Procedure: TEMPORARY PACEMAKER INSERTION;  Surgeon: [name], MD;  Location: [facility] CATH LAB;  Service: Cardiovascular;  Laterality: N/A;   THROMBECTOMY W/ EMBOLECTOMY Right 04/03/2021   Procedure: THROMBECTOMY ARTERIOVENOUS FISTULA;  Surgeon: Cherre Robins, MD;  Location[facility] OR;  Service: Vascular;  Laterality: Right;    Family History  Problem Relation Age of Onset   Heart attack Other 55    Social History   Tobacco Use   Smoking status: Former    Types: Cigarettes    Quit date: 02/24/2007    Years since quitting: 14.7   Smokeless tobacco: Never  Vaping Use   Vaping Use: Never used  Substance Use Topics   Alcohol use: Not Currently    Alcohol/week: 1.0 standard drink of alcohol    Types: 1 Standard drinks or equivalent per week    Comment: socially    Drug use: No    Prior to Admission medications   Medication Sig Start Date End Date Taking? Authorizing Provider  acetaminophen (TYLENOL) 325 MG tablet Take 2 tablets (650 mg total) by mouth every 6 (six) hours as needed for mild pain (or Fever >/= 101). 04/10/21  Yes Bonnielee Haff, MD  Amino Acids-Protein Hydrolys (PRO-STAT SUGAR FREE PO) Take 30 mLs by mouth every  evening.   Yes [provider]  aspirin EC 81 MG tablet Take 1 tablet (81 mg total) by mouth daily. Swallow whole. 10/31/21  Yes Lavina Hamman, MD  benzonatate (TESSALON) 100 MG capsule Take 1 capsule (100 mg total) by mouth 3 (three) times daily. 10/30/21  Yes Lavina Hamman, MD  busPIRone (BUSPAR) 5 MG tablet Take 1 tablet (5 mg total) by mouth 3 (three) times daily. (0800 & 2000) Patient taking differently: Take 5 mg by mouth 2 (two) times daily. (0800 & 2000) 10/30/21  Yes Lavina Hamman, MD  Darbepoetin Alfa (ARANESP) 60 MCG/0.3ML SOSY injection Inject 0.3 mLs (60 mcg total) into the vein every Monday with hemodialysis. 09/30/21  Yes Cherene Altes, MD  dextromethorphan-guaiFENesin [facility] DM) 30-600 MG 12hr  tablet Take 1 tablet by mouth 2 (two) times daily. 10/30/21  Yes Lavina Hamman, MD  diltiazem (CARDIZEM CD) 240 MG 24 hr capsule Take 1 capsule (240 mg total) by mouth daily. 10/30/21  Yes Lavina Hamman, MD  ferrous sulfate 325 (65 FE) MG tablet Take 325 mg by mouth every Monday, Wednesday, and Friday.   Yes [provider]  guaiFENesin (ROBITUSSIN) 100 MG/5ML liquid Take 10 mLs by mouth every 4 (four) hours as needed for cough or to loosen phlegm. 10/30/21  Yes Lavina Hamman, MD  hydrALAZINE (APRESOLINE) 10 MG tablet Take 10 mg by mouth 3 (three) times daily as needed (for SBP greater than 180).   Yes [provider]  HYDROcodone-acetaminophen (NORCO) 10-325 MG tablet Take 1 tablet by mouth every 4 (four) hours as needed for severe pain or moderate pain. 10/30/21  Yes Lavina Hamman, MD  Ipratropium-Albuterol (COMBIVENT) 20-100 MCG/ACT AERS respimat Inhale 1 puff into the lungs 4 (four) times daily. (0900, 1300, 1700 & 2100)   Yes [provider]  ipratropium-albuterol (DUONEB) 0.5-2.5 (3) MG/3ML SOLN Take 3 mLs by nebulization every 4 (four) hours as needed (shortness of breath/wheezing).   Yes [provider]  levothyroxine (SYNTHROID) 50 MCG  tablet Take 50 mcg by mouth daily before breakfast. (0630)   Yes [provider]  LIDOCAINE PAIN RELIEF 4 % Place 1 patch onto the skin daily. Apply 1 patch to back in the morning and remove at bedtime for pain. 10/17/21  Yes [provider]  melatonin 5 MG TABS Take 5 mg by mouth at bedtime. 09/30/21  Yes [provider]  metoprolol tartrate (LOPRESSOR) 25 MG tablet Take 0.5 tablets (12.5 mg total) by mouth 2 (two) times daily. 10/30/21  Yes Lavina Hamman, MD  naphazoline-glycerin (CLEAR EYES REDNESS) 0.012-0.25 % SOLN Place 1-2 drops into the right eye 3 (three) times daily. 04/23/21  Yes Alma Friendly, MD  neomycin-bacitracin-polymyxin (NEOSPORIN) OINT Apply 1 application topically 3 (three) times daily. To right antecubital area 04/10/21  Yes Bonnielee Haff, MD  Nutritional Supplements (NOVASOURCE RENAL) LIQD Take 237 mLs by mouth in the morning and at bedtime. (0800 & 1700)   Yes [provider]  pantoprazole (PROTONIX) 40 MG tablet Take 1 tablet (40 mg total) by mouth daily for 14 days. 10/30/21 11/13/21 Yes Lavina Hamman, MD  polyethylene glycol (MIRALAX / GLYCOLAX) 17 g packet Take 17 g by mouth daily. 05/01/21  Yes Sherwood [place], MD  pravastatin (PRAVACHOL) 40 MG tablet Take 40 mg by mouth at bedtime. (2100)   Yes [provider]  senna (SENOKOT) 8.6 MG tablet Take 2 tablets by mouth at bedtime. 10/15/21  Yes [provider]  sevelamer carbonate (RENVELA) 800 MG tablet Take 1,600 mg by mouth 3 (three) times daily with meals.   Yes [provider]  tamsulosin (FLOMAX) 0.4 MG CAPS capsule Take 1 capsule (0.4 mg total) by mouth daily. 04/11/21  Yes Bonnielee Haff, MD  Menthol, Topical Analgesic, (BIOFREEZE) 4 % GEL Apply 1 Application topically 3 (three) times daily as needed (pain). Patient not taking: Reported on 10/21/2021    [provider]  multivitamin (RENA-VIT) TABS tablet Take 1 tablet by mouth at bedtime. 10/30/21    Lavina Hamman, MD    Current Facility-Administered Medications  Medication Dose Route Frequency Provider Last Rate Last Admin   acetaminophen (TYLENOL) tablet 650 mg  650 mg Oral Q6H PRN Kristopher Oppenheim, DO   650 mg  at 11/05/21 2351   Or   acetaminophen (TYLENOL) suppository 650 mg  650 mg Rectal Q6H PRN Kristopher Oppenheim, DO       busPIRone (BUSPAR) tablet 5 mg  5 mg Oral TID Kristopher Oppenheim, DO   5 mg at 11/06/21 0953   diltiazem (CARDIZEM CD) 24 hr capsule 240 mg  240 mg Oral Daily Kristopher Oppenheim, DO   240 mg at 11/06/21 5621   Ipratropium-Albuterol (COMBIVENT) respimat 1 puff  1 puff Inhalation QID Kristopher Oppenheim, DO   1 puff at 11/06/21 0953   levothyroxine (SYNTHROID) tablet 50 mcg  50 mcg Oral Q0600 Kristopher Oppenheim, DO   50 mcg at 11/06/21 0525   metoprolol tartrate (LOPRESSOR) tablet 12.5 mg  12.5 mg Oral BID Kristopher Oppenheim, DO   12.5 mg at 11/06/21 0953   ondansetron (ZOFRAN) tablet 4 mg  4 mg Oral Q6H PRN Kristopher Oppenheim, DO       Or   ondansetron [facility]) injection 4 mg  4 mg Intravenous Q6H PRN Kristopher Oppenheim, DO       pantoprozole (PROTONIX) 80 mg /NS 100 mL infusion  8 mg/hr Intravenous Continuous Kristopher Oppenheim, DO 10 mL/hr at 11/06/21 0953 8 mg/hr at 11/06/21 0953   [START ON 11/07/2021] pneumococcal 20-valent conjugate vaccine (PREVNAR 20) injection 0.5 mL  0.5 mL Intramuscular Tomorrow-1000 Velna Ochs, MD        Allergies as of 11/05/2021 - Review Complete 11/05/2021  Allergen Reaction Noted   Bee venom Anaphylaxis 02/22/2011   Influenza vaccines Other (See Comments) 07/13/2013     Review of Systems:    Constitutional: No weight loss, fever or chills Skin: No rash Cardiovascular: No chest pain Respiratory: +chronic SOB Gastrointestinal: See HPI and otherwise negative Genitourinary: No dysuria Neurological: No headache, dizziness or syncope Musculoskeletal: No new muscle or joint pain Hematologic: No bruising Psychiatric: No history of depression or anxiety    Physical Exam:  Vital signs in last 24  hours: Temp:  [98.6 F (37 C)-98.9 F (37.2 C)] 98.6 F (37 C) (09/14 0802) Pulse Rate:  [82-99] 82 (09/14 0802) Resp:  [13-20] 16 (09/14 0802) BP: (138-156)/(45-61) 149/61 (09/14 0802) SpO2:  [97 %-100 %] 98 % (09/14 0802)   General:   Pleasant Elderly Caucasian male appears to be in NAD, Well developed, Well nourished, alert and cooperative Head:  Normocephalic and atraumatic. Eyes:   PEERL, EOMI. No icterus. Conjunctiva pink. Ears:  Normal auditory acuity. Neck:  Supple Throat: Oral cavity and pharynx without inflammation, swelling or lesion. Lungs: Respirations even and unlabored. Lungs clear to auscultation bilaterally.   No wheezes, crackles, or rhonchi.  Heart: Normal S1, S2. No MRG. Regular rate and rhythm. No peripheral edema, cyanosis or pallor.  Abdomen:  Soft, nondistended, mild generalized ttp,  No rebound or guarding. Normal bowel sounds. No appreciable masses or hepatomegaly. Rectal:  Not performed.  Msk:  Symmetrical without gross deformities. Peripheral pulses intact.  Extremities:  Without edema, no deformity or joint abnormality.  Neurologic:  Alert and  oriented x4;  grossly normal neurologically.  Skin:   Dry and intact without significant lesions or rashes. Psychiatric: Demonstrates good judgement and reason without abnormal affect or behaviors.   LAB RESULTS: Recent Labs    11/05/21 1750 11/06/21 0533  WBC 17.9* 12.2*  HGB 12.3* 10.4*  HCT 38.6* 32.5*  PLT 422* 327   BMET Recent Labs    11/05/21 1750  NA 137  K 4.6  CL 97*  CO2 26  GLUCOSE  123*  BUN 16  CREATININE 3.00*  CALCIUM 8.9   LFT Recent Labs    11/05/21 1750  PROT 7.1  ALBUMIN 3.2*  AST 21  ALT 21  ALKPHOS 44  BILITOT 1.1   PT/INR Recent Labs    11/05/21 1750  LABPROT 14.6  INR 1.2     Impression / Plan:   Impression: 1.  Upper GI bleed: Started with hematemesis 11/05/2021 with 1 or 2 episodes after dialysis, none overnight, hemoglobin 12.3--> 10.4, last EGD in  February with nonbleeding gastric and duodenal ulcers, no NSAID history, no blood thinners; question of PUD versus other 2.  ESRD on HD Monday Wednesday Friday 3.  A-fib: Not on systemic anticoagulation due to fall risk aspirin on hold 4.  COPD with chronic respiratory failure on O2 via nasal cannula 5.  COVID-19  Plan: 1.  [place] for EGD this afternoon 2:30 with Dr. Bryan Lemma. Discussed risks, benefits, limitations and alternatives with patient he agrees to proceed. 2.  Patient remain n.p.o. for now 3.  Continue Pantoprazole infusion for now. 4.  Continue to monitor hemoglobin and transfuse as needed less than 7  Thank you for your kind consultation, we will continue to follow.  Lavone Nian [facility]  11/06/2021, 10:38 AM

## 2021-11-06 NOTE — Interval H&P Note (Signed)
History and Physical Interval Note:  11/06/2021 12:16 PM  Wayne Meadows  has presented today for surgery, with the diagnosis of Hematemesis.  The various methods of treatment have been discussed with the patient and family. After consideration of risks, benefits and other options for treatment, the patient has consented to  Procedure(s): ESOPHAGOGASTRODUODENOSCOPY (EGD) WITH PROPOFOL (N/A) as a surgical intervention.  The patient's history has been reviewed, patient examined, no change in status, stable for surgery.  I have reviewed the patient's chart and labs.  Questions were answered to the patient's satisfaction.     Dominic Pea Sophy Mesler

## 2021-11-06 NOTE — Anesthesia Preprocedure Evaluation (Addendum)
Anesthesia Evaluation  Patient identified by MRN, date of birth, ID band Patient awake    Reviewed: Allergy & Precautions, NPO status , Patient's Chart, lab work & pertinent test results  Airway Mallampati: II  TM Distance: >3 FB Neck ROM: Full    Dental  (+) Edentulous Upper, Edentulous Lower, Dental Advisory Given   Pulmonary COPD (2.5L O2),  oxygen dependent, former smoker,    Pulmonary exam normal breath sounds clear to auscultation       Cardiovascular hypertension, Pt. on medications + angina + CAD, + Past MI and + Cardiac Stents  Normal cardiovascular exam+ dysrhythmias Atrial Fibrillation + Valvular Problems/Murmurs (mild/mod AI) AI  Rhythm:Regular Rate:Normal  TTE 2023 1. Left ventricular ejection fraction, by estimation, is 65 to 70%. The  left ventricle has hyperdynamic function. The left ventricle has no  regional wall motion abnormalities. There is mild concentric left  ventricular hypertrophy. Left ventricular  diastolic parameters are consistent with Grade I diastolic dysfunction  (impaired relaxation).  2. Right ventricular systolic function is normal. The right ventricular  size is normal. There is mildly elevated pulmonary artery systolic  pressure.  3. Left atrial size was mildly dilated.  4. 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  can't rule out PFO.  5. Right atrial size was mildly dilated.  6. The mitral valve is normal in structure. No evidence of mitral valve  regurgitation.  7. The aortic valve was not well visualized. Aortic valve regurgitation  is mild to moderate.  8. The inferior vena cava is normal in size with greater than 50%  respiratory variability, suggesting right atrial pressure of 3 mmHg.   Cath 2022 Stable three-vessel disease with widely patent stent in the proximal Ramus Intermedius, moderate 40 to 50% in-stent restenosis in the proximal RCA BMS,  focal 50% mid LAD just prior to a bend (the previously noted subtotaled first diagonal branch is now fully occluded). Severe Systemic Hypertensionwith moderately elevated LVEDP. (LV gram not performed because of contrast conservation)    Neuro/Psych PSYCHIATRIC DISORDERS Anxiety CVA    GI/Hepatic Neg liver ROS, PUD,   Endo/Other  Hypothyroidism   Renal/GU ESRF and DialysisRenal disease (dialysis MWF, completed dialysis yesterday)  negative genitourinary   Musculoskeletal  (+) Arthritis ,   Abdominal   Peds  Hematology  (+) Blood dyscrasia, anemia ,   Anesthesia Other Findings   Reproductive/Obstetrics                            Anesthesia Physical  Anesthesia Plan  ASA: 4 and emergent  Anesthesia Plan: MAC   Post-op Pain Management:    Induction: Intravenous  PONV Risk Score and Plan: 2 and Ondansetron and Dexamethasone  Airway Management Planned: Natural Airway and Simple Face Mask  Additional Equipment: None  Intra-op Plan:   Post-operative Plan:   Informed Consent: I have reviewed the patients History and Physical, chart, labs and discussed the procedure including the risks, benefits and alternatives for the proposed anesthesia with the patient or authorized representative who has indicated his/her understanding and acceptance.     Dental advisory given  Plan Discussed with: CRNA and Anesthesiologist  Anesthesia Plan Comments: (CC: hematemesis HPI: 87 yo WM with hx of ESRD on HD on M, W, F, hx of HTN, hypothyroidism, hx of gastric and duodenal ulcer 03-2021, chronic hypoxic respiratory failure on O2 @ 2.5 L/min, COPD, presents to the ER today with hematemesis  after his dialysis.  Patient states that he had some vomiting of blood yesterday while he is in the nursing home in the evening.  He continued this this afternoon after dialysis around 1 PM.  Patient brought to the ER.)        Anesthesia Quick Evaluation

## 2021-11-07 LAB — HEPATITIS B SURFACE ANTIBODY, QUANTITATIVE: Hep B S AB Quant (Post): <3.1 m[IU]/mL — ABNORMAL LOW (ref >9.9)

## 2021-11-10 LAB — SURGICAL PATHOLOGY

## 2021-11-11 DIAGNOSIS — R278 Other lack of coordination: Secondary | ICD-10-CM | POA: Diagnosis not present

## 2021-11-11 DIAGNOSIS — I5033 Acute on chronic diastolic (congestive) heart failure: Secondary | ICD-10-CM | POA: Diagnosis not present

## 2021-11-11 DIAGNOSIS — M6281 Muscle weakness (generalized): Secondary | ICD-10-CM | POA: Diagnosis not present

## 2021-11-11 DIAGNOSIS — R262 Difficulty in walking, not elsewhere classified: Secondary | ICD-10-CM | POA: Diagnosis not present

## 2021-11-11 DIAGNOSIS — D500 Iron deficiency anemia secondary to blood loss (chronic): Secondary | ICD-10-CM | POA: Diagnosis not present

## 2021-11-11 DIAGNOSIS — J9611 Chronic respiratory failure with hypoxia: Secondary | ICD-10-CM | POA: Diagnosis not present

## 2021-11-11 DIAGNOSIS — U071 COVID-19: Secondary | ICD-10-CM | POA: Diagnosis not present

## 2021-11-11 DIAGNOSIS — J449 Chronic obstructive pulmonary disease, unspecified: Secondary | ICD-10-CM | POA: Diagnosis not present

## 2021-11-11 DIAGNOSIS — K920 Hematemesis: Secondary | ICD-10-CM | POA: Diagnosis not present

## 2021-11-11 DIAGNOSIS — N186 End stage renal disease: Secondary | ICD-10-CM | POA: Diagnosis not present

## 2021-11-11 DIAGNOSIS — S2243XS Multiple fractures of ribs, bilateral, sequela: Secondary | ICD-10-CM | POA: Diagnosis not present

## 2021-11-11 DIAGNOSIS — R2681 Unsteadiness on feet: Secondary | ICD-10-CM | POA: Diagnosis not present

## 2021-11-11 DIAGNOSIS — R293 Abnormal posture: Secondary | ICD-10-CM | POA: Diagnosis not present

## 2021-11-11 DIAGNOSIS — J690 Pneumonitis due to inhalation of food and vomit: Secondary | ICD-10-CM | POA: Diagnosis not present

## 2021-11-11 DIAGNOSIS — S22060S Wedge compression fracture of T7-T8 vertebra, sequela: Secondary | ICD-10-CM | POA: Diagnosis not present

## 2021-11-12 DIAGNOSIS — S2243XS Multiple fractures of ribs, bilateral, sequela: Secondary | ICD-10-CM | POA: Diagnosis not present

## 2021-11-12 DIAGNOSIS — J690 Pneumonitis due to inhalation of food and vomit: Secondary | ICD-10-CM | POA: Diagnosis not present

## 2021-11-12 DIAGNOSIS — N186 End stage renal disease: Secondary | ICD-10-CM | POA: Diagnosis not present

## 2021-11-12 DIAGNOSIS — J449 Chronic obstructive pulmonary disease, unspecified: Secondary | ICD-10-CM | POA: Diagnosis not present

## 2021-11-12 DIAGNOSIS — J9611 Chronic respiratory failure with hypoxia: Secondary | ICD-10-CM | POA: Diagnosis not present

## 2021-11-12 DIAGNOSIS — S22060S Wedge compression fracture of T7-T8 vertebra, sequela: Secondary | ICD-10-CM | POA: Diagnosis not present

## 2021-11-13 DIAGNOSIS — S22060S Wedge compression fracture of T7-T8 vertebra, sequela: Secondary | ICD-10-CM | POA: Diagnosis not present

## 2021-11-13 DIAGNOSIS — L89134 Pressure ulcer of right lower back, stage 4: Secondary | ICD-10-CM | POA: Diagnosis not present

## 2021-11-13 DIAGNOSIS — J690 Pneumonitis due to inhalation of food and vomit: Secondary | ICD-10-CM | POA: Diagnosis not present

## 2021-11-13 DIAGNOSIS — N186 End stage renal disease: Secondary | ICD-10-CM | POA: Diagnosis not present

## 2021-11-13 DIAGNOSIS — J449 Chronic obstructive pulmonary disease, unspecified: Secondary | ICD-10-CM | POA: Diagnosis not present

## 2021-11-13 DIAGNOSIS — J9611 Chronic respiratory failure with hypoxia: Secondary | ICD-10-CM | POA: Diagnosis not present

## 2021-11-13 DIAGNOSIS — S2243XS Multiple fractures of ribs, bilateral, sequela: Secondary | ICD-10-CM | POA: Diagnosis not present

## 2021-11-17 DIAGNOSIS — J449 Chronic obstructive pulmonary disease, unspecified: Secondary | ICD-10-CM | POA: Diagnosis not present

## 2021-11-17 DIAGNOSIS — N186 End stage renal disease: Secondary | ICD-10-CM | POA: Diagnosis not present

## 2021-11-17 DIAGNOSIS — S2243XS Multiple fractures of ribs, bilateral, sequela: Secondary | ICD-10-CM | POA: Diagnosis not present

## 2021-11-17 DIAGNOSIS — J690 Pneumonitis due to inhalation of food and vomit: Secondary | ICD-10-CM | POA: Diagnosis not present

## 2021-11-17 DIAGNOSIS — S22060S Wedge compression fracture of T7-T8 vertebra, sequela: Secondary | ICD-10-CM | POA: Diagnosis not present

## 2021-11-17 DIAGNOSIS — J9611 Chronic respiratory failure with hypoxia: Secondary | ICD-10-CM | POA: Diagnosis not present

## 2021-11-18 DIAGNOSIS — J690 Pneumonitis due to inhalation of food and vomit: Secondary | ICD-10-CM | POA: Diagnosis not present

## 2021-11-18 DIAGNOSIS — S2243XS Multiple fractures of ribs, bilateral, sequela: Secondary | ICD-10-CM | POA: Diagnosis not present

## 2021-11-18 DIAGNOSIS — N186 End stage renal disease: Secondary | ICD-10-CM | POA: Diagnosis not present

## 2021-11-18 DIAGNOSIS — J9611 Chronic respiratory failure with hypoxia: Secondary | ICD-10-CM | POA: Diagnosis not present

## 2021-11-18 DIAGNOSIS — S22060S Wedge compression fracture of T7-T8 vertebra, sequela: Secondary | ICD-10-CM | POA: Diagnosis not present

## 2021-11-18 DIAGNOSIS — J449 Chronic obstructive pulmonary disease, unspecified: Secondary | ICD-10-CM | POA: Diagnosis not present

## 2021-11-19 DIAGNOSIS — J449 Chronic obstructive pulmonary disease, unspecified: Secondary | ICD-10-CM | POA: Diagnosis not present

## 2021-11-19 DIAGNOSIS — J9611 Chronic respiratory failure with hypoxia: Secondary | ICD-10-CM | POA: Diagnosis not present

## 2021-11-19 DIAGNOSIS — S2243XS Multiple fractures of ribs, bilateral, sequela: Secondary | ICD-10-CM | POA: Diagnosis not present

## 2021-11-19 DIAGNOSIS — S22060S Wedge compression fracture of T7-T8 vertebra, sequela: Secondary | ICD-10-CM | POA: Diagnosis not present

## 2021-11-19 DIAGNOSIS — J690 Pneumonitis due to inhalation of food and vomit: Secondary | ICD-10-CM | POA: Diagnosis not present

## 2021-11-19 DIAGNOSIS — N186 End stage renal disease: Secondary | ICD-10-CM | POA: Diagnosis not present

## 2021-11-20 DIAGNOSIS — J449 Chronic obstructive pulmonary disease, unspecified: Secondary | ICD-10-CM | POA: Diagnosis not present

## 2021-11-20 DIAGNOSIS — S2243XS Multiple fractures of ribs, bilateral, sequela: Secondary | ICD-10-CM | POA: Diagnosis not present

## 2021-11-20 DIAGNOSIS — N186 End stage renal disease: Secondary | ICD-10-CM | POA: Diagnosis not present

## 2021-11-20 DIAGNOSIS — J9611 Chronic respiratory failure with hypoxia: Secondary | ICD-10-CM | POA: Diagnosis not present

## 2021-11-20 DIAGNOSIS — L89134 Pressure ulcer of right lower back, stage 4: Secondary | ICD-10-CM | POA: Diagnosis not present

## 2021-11-20 DIAGNOSIS — J690 Pneumonitis due to inhalation of food and vomit: Secondary | ICD-10-CM | POA: Diagnosis not present

## 2021-11-20 DIAGNOSIS — S22060S Wedge compression fracture of T7-T8 vertebra, sequela: Secondary | ICD-10-CM | POA: Diagnosis not present

## 2021-11-21 DIAGNOSIS — N186 End stage renal disease: Secondary | ICD-10-CM | POA: Diagnosis not present

## 2021-11-21 DIAGNOSIS — J9611 Chronic respiratory failure with hypoxia: Secondary | ICD-10-CM | POA: Diagnosis not present

## 2021-11-21 DIAGNOSIS — S22060S Wedge compression fracture of T7-T8 vertebra, sequela: Secondary | ICD-10-CM | POA: Diagnosis not present

## 2021-11-21 DIAGNOSIS — J690 Pneumonitis due to inhalation of food and vomit: Secondary | ICD-10-CM | POA: Diagnosis not present

## 2021-11-21 DIAGNOSIS — S2243XS Multiple fractures of ribs, bilateral, sequela: Secondary | ICD-10-CM | POA: Diagnosis not present

## 2021-11-21 DIAGNOSIS — J449 Chronic obstructive pulmonary disease, unspecified: Secondary | ICD-10-CM | POA: Diagnosis not present

## 2021-11-24 DIAGNOSIS — R293 Abnormal posture: Secondary | ICD-10-CM | POA: Diagnosis not present

## 2021-11-24 DIAGNOSIS — R262 Difficulty in walking, not elsewhere classified: Secondary | ICD-10-CM | POA: Diagnosis not present

## 2021-11-24 DIAGNOSIS — J690 Pneumonitis due to inhalation of food and vomit: Secondary | ICD-10-CM | POA: Diagnosis not present

## 2021-11-24 DIAGNOSIS — S2243XS Multiple fractures of ribs, bilateral, sequela: Secondary | ICD-10-CM | POA: Diagnosis not present

## 2021-11-24 DIAGNOSIS — R2681 Unsteadiness on feet: Secondary | ICD-10-CM | POA: Diagnosis not present

## 2021-11-24 DIAGNOSIS — R110 Nausea: Secondary | ICD-10-CM | POA: Diagnosis not present

## 2021-11-24 DIAGNOSIS — M6281 Muscle weakness (generalized): Secondary | ICD-10-CM | POA: Diagnosis not present

## 2021-11-24 DIAGNOSIS — S22060S Wedge compression fracture of T7-T8 vertebra, sequela: Secondary | ICD-10-CM | POA: Diagnosis not present

## 2021-11-24 DIAGNOSIS — R278 Other lack of coordination: Secondary | ICD-10-CM | POA: Diagnosis not present

## 2021-11-24 DIAGNOSIS — R109 Unspecified abdominal pain: Secondary | ICD-10-CM | POA: Diagnosis not present

## 2021-11-24 DIAGNOSIS — I120 Hypertensive chronic kidney disease with stage 5 chronic kidney disease or end stage renal disease: Secondary | ICD-10-CM | POA: Diagnosis not present

## 2021-11-24 DIAGNOSIS — J9611 Chronic respiratory failure with hypoxia: Secondary | ICD-10-CM | POA: Diagnosis not present

## 2021-11-24 DIAGNOSIS — J449 Chronic obstructive pulmonary disease, unspecified: Secondary | ICD-10-CM | POA: Diagnosis not present

## 2021-11-24 DIAGNOSIS — R140 Abdominal distension (gaseous): Secondary | ICD-10-CM | POA: Diagnosis not present

## 2021-11-24 DIAGNOSIS — I480 Paroxysmal atrial fibrillation: Secondary | ICD-10-CM | POA: Diagnosis not present

## 2021-11-24 DIAGNOSIS — N186 End stage renal disease: Secondary | ICD-10-CM | POA: Diagnosis not present

## 2021-11-25 DIAGNOSIS — R110 Nausea: Secondary | ICD-10-CM | POA: Diagnosis not present

## 2021-11-25 DIAGNOSIS — I480 Paroxysmal atrial fibrillation: Secondary | ICD-10-CM | POA: Diagnosis not present

## 2021-11-25 DIAGNOSIS — S2243XS Multiple fractures of ribs, bilateral, sequela: Secondary | ICD-10-CM | POA: Diagnosis not present

## 2021-11-25 DIAGNOSIS — J690 Pneumonitis due to inhalation of food and vomit: Secondary | ICD-10-CM | POA: Diagnosis not present

## 2021-11-25 DIAGNOSIS — S22060S Wedge compression fracture of T7-T8 vertebra, sequela: Secondary | ICD-10-CM | POA: Diagnosis not present

## 2021-11-25 DIAGNOSIS — I10 Essential (primary) hypertension: Secondary | ICD-10-CM | POA: Diagnosis not present

## 2021-11-25 DIAGNOSIS — I120 Hypertensive chronic kidney disease with stage 5 chronic kidney disease or end stage renal disease: Secondary | ICD-10-CM | POA: Diagnosis not present

## 2021-11-25 DIAGNOSIS — N186 End stage renal disease: Secondary | ICD-10-CM | POA: Diagnosis not present

## 2021-11-25 DIAGNOSIS — J449 Chronic obstructive pulmonary disease, unspecified: Secondary | ICD-10-CM | POA: Diagnosis not present

## 2021-11-25 DIAGNOSIS — J9611 Chronic respiratory failure with hypoxia: Secondary | ICD-10-CM | POA: Diagnosis not present

## 2021-11-26 DIAGNOSIS — J449 Chronic obstructive pulmonary disease, unspecified: Secondary | ICD-10-CM | POA: Diagnosis not present

## 2021-11-26 DIAGNOSIS — J9611 Chronic respiratory failure with hypoxia: Secondary | ICD-10-CM | POA: Diagnosis not present

## 2021-11-26 DIAGNOSIS — S2243XS Multiple fractures of ribs, bilateral, sequela: Secondary | ICD-10-CM | POA: Diagnosis not present

## 2021-11-26 DIAGNOSIS — J690 Pneumonitis due to inhalation of food and vomit: Secondary | ICD-10-CM | POA: Diagnosis not present

## 2021-11-26 DIAGNOSIS — N186 End stage renal disease: Secondary | ICD-10-CM | POA: Diagnosis not present

## 2021-11-26 DIAGNOSIS — S22060S Wedge compression fracture of T7-T8 vertebra, sequela: Secondary | ICD-10-CM | POA: Diagnosis not present

## 2021-11-27 DIAGNOSIS — J9611 Chronic respiratory failure with hypoxia: Secondary | ICD-10-CM | POA: Diagnosis not present

## 2021-11-27 DIAGNOSIS — L89134 Pressure ulcer of right lower back, stage 4: Secondary | ICD-10-CM | POA: Diagnosis not present

## 2021-11-27 DIAGNOSIS — J690 Pneumonitis due to inhalation of food and vomit: Secondary | ICD-10-CM | POA: Diagnosis not present

## 2021-11-27 DIAGNOSIS — J449 Chronic obstructive pulmonary disease, unspecified: Secondary | ICD-10-CM | POA: Diagnosis not present

## 2021-11-27 DIAGNOSIS — N186 End stage renal disease: Secondary | ICD-10-CM | POA: Diagnosis not present

## 2021-11-27 DIAGNOSIS — S22060S Wedge compression fracture of T7-T8 vertebra, sequela: Secondary | ICD-10-CM | POA: Diagnosis not present

## 2021-11-27 DIAGNOSIS — S2243XS Multiple fractures of ribs, bilateral, sequela: Secondary | ICD-10-CM | POA: Diagnosis not present

## 2021-11-28 DIAGNOSIS — S22060S Wedge compression fracture of T7-T8 vertebra, sequela: Secondary | ICD-10-CM | POA: Diagnosis not present

## 2021-11-28 DIAGNOSIS — J449 Chronic obstructive pulmonary disease, unspecified: Secondary | ICD-10-CM | POA: Diagnosis not present

## 2021-11-28 DIAGNOSIS — J690 Pneumonitis due to inhalation of food and vomit: Secondary | ICD-10-CM | POA: Diagnosis not present

## 2021-11-28 DIAGNOSIS — J9611 Chronic respiratory failure with hypoxia: Secondary | ICD-10-CM | POA: Diagnosis not present

## 2021-11-28 DIAGNOSIS — S2243XS Multiple fractures of ribs, bilateral, sequela: Secondary | ICD-10-CM | POA: Diagnosis not present

## 2021-11-28 DIAGNOSIS — N186 End stage renal disease: Secondary | ICD-10-CM | POA: Diagnosis not present

## 2021-12-01 DIAGNOSIS — J690 Pneumonitis due to inhalation of food and vomit: Secondary | ICD-10-CM | POA: Diagnosis not present

## 2021-12-01 DIAGNOSIS — Z79899 Other long term (current) drug therapy: Secondary | ICD-10-CM | POA: Diagnosis not present

## 2021-12-01 DIAGNOSIS — J449 Chronic obstructive pulmonary disease, unspecified: Secondary | ICD-10-CM | POA: Diagnosis not present

## 2021-12-01 DIAGNOSIS — N186 End stage renal disease: Secondary | ICD-10-CM | POA: Diagnosis not present

## 2021-12-01 DIAGNOSIS — J9611 Chronic respiratory failure with hypoxia: Secondary | ICD-10-CM | POA: Diagnosis not present

## 2021-12-01 DIAGNOSIS — S22060S Wedge compression fracture of T7-T8 vertebra, sequela: Secondary | ICD-10-CM | POA: Diagnosis not present

## 2021-12-01 DIAGNOSIS — S2243XS Multiple fractures of ribs, bilateral, sequela: Secondary | ICD-10-CM | POA: Diagnosis not present

## 2021-12-02 DIAGNOSIS — J9611 Chronic respiratory failure with hypoxia: Secondary | ICD-10-CM | POA: Diagnosis not present

## 2021-12-02 DIAGNOSIS — S2243XS Multiple fractures of ribs, bilateral, sequela: Secondary | ICD-10-CM | POA: Diagnosis not present

## 2021-12-02 DIAGNOSIS — N186 End stage renal disease: Secondary | ICD-10-CM | POA: Diagnosis not present

## 2021-12-02 DIAGNOSIS — J690 Pneumonitis due to inhalation of food and vomit: Secondary | ICD-10-CM | POA: Diagnosis not present

## 2021-12-02 DIAGNOSIS — Z79899 Other long term (current) drug therapy: Secondary | ICD-10-CM | POA: Diagnosis not present

## 2021-12-02 DIAGNOSIS — J449 Chronic obstructive pulmonary disease, unspecified: Secondary | ICD-10-CM | POA: Diagnosis not present

## 2021-12-02 DIAGNOSIS — S22060S Wedge compression fracture of T7-T8 vertebra, sequela: Secondary | ICD-10-CM | POA: Diagnosis not present

## 2021-12-03 DIAGNOSIS — S22060S Wedge compression fracture of T7-T8 vertebra, sequela: Secondary | ICD-10-CM | POA: Diagnosis not present

## 2021-12-03 DIAGNOSIS — S2243XS Multiple fractures of ribs, bilateral, sequela: Secondary | ICD-10-CM | POA: Diagnosis not present

## 2021-12-03 DIAGNOSIS — N186 End stage renal disease: Secondary | ICD-10-CM | POA: Diagnosis not present

## 2021-12-03 DIAGNOSIS — J690 Pneumonitis due to inhalation of food and vomit: Secondary | ICD-10-CM | POA: Diagnosis not present

## 2021-12-03 DIAGNOSIS — J9611 Chronic respiratory failure with hypoxia: Secondary | ICD-10-CM | POA: Diagnosis not present

## 2021-12-03 DIAGNOSIS — J449 Chronic obstructive pulmonary disease, unspecified: Secondary | ICD-10-CM | POA: Diagnosis not present

## 2021-12-03 DIAGNOSIS — Z79899 Other long term (current) drug therapy: Secondary | ICD-10-CM | POA: Diagnosis not present

## 2021-12-04 ENCOUNTER — Emergency Department (HOSPITAL_COMMUNITY): Payer: No Typology Code available for payment source

## 2021-12-04 ENCOUNTER — Emergency Department (HOSPITAL_COMMUNITY)
Admission: EM | Admit: 2021-12-04 | Discharge: 2021-12-04 | Disposition: A | Discharge status: Skilled Nursing Facility | Payer: No Typology Code available for payment source | Attending: Emergency Medicine | Admitting: Emergency Medicine

## 2021-12-04 ENCOUNTER — Other Ambulatory Visit: Payer: Self-pay

## 2021-12-04 DIAGNOSIS — I2510 Atherosclerotic heart disease of native coronary artery without angina pectoris: Secondary | ICD-10-CM | POA: Insufficient documentation

## 2021-12-04 DIAGNOSIS — J449 Chronic obstructive pulmonary disease, unspecified: Secondary | ICD-10-CM | POA: Insufficient documentation

## 2021-12-04 DIAGNOSIS — R609 Edema, unspecified: Secondary | ICD-10-CM | POA: Diagnosis not present

## 2021-12-04 DIAGNOSIS — Z87891 Personal history of nicotine dependence: Secondary | ICD-10-CM | POA: Diagnosis not present

## 2021-12-04 DIAGNOSIS — Z743 Need for continuous supervision: Secondary | ICD-10-CM | POA: Diagnosis not present

## 2021-12-04 DIAGNOSIS — M545 Low back pain, unspecified: Secondary | ICD-10-CM | POA: Diagnosis not present

## 2021-12-04 DIAGNOSIS — N186 End stage renal disease: Secondary | ICD-10-CM | POA: Insufficient documentation

## 2021-12-04 DIAGNOSIS — S0003XA Contusion of scalp, initial encounter: Secondary | ICD-10-CM | POA: Diagnosis not present

## 2021-12-04 DIAGNOSIS — Z8616 Personal history of COVID-19: Secondary | ICD-10-CM | POA: Insufficient documentation

## 2021-12-04 DIAGNOSIS — G8929 Other chronic pain: Secondary | ICD-10-CM | POA: Insufficient documentation

## 2021-12-04 DIAGNOSIS — I120 Hypertensive chronic kidney disease with stage 5 chronic kidney disease or end stage renal disease: Secondary | ICD-10-CM | POA: Insufficient documentation

## 2021-12-04 DIAGNOSIS — W19XXXA Unspecified fall, initial encounter: Secondary | ICD-10-CM | POA: Diagnosis not present

## 2021-12-04 DIAGNOSIS — I480 Paroxysmal atrial fibrillation: Secondary | ICD-10-CM | POA: Diagnosis not present

## 2021-12-04 DIAGNOSIS — E039 Hypothyroidism, unspecified: Secondary | ICD-10-CM | POA: Insufficient documentation

## 2021-12-04 DIAGNOSIS — Z992 Dependence on renal dialysis: Secondary | ICD-10-CM | POA: Insufficient documentation

## 2021-12-04 DIAGNOSIS — S0990XA Unspecified injury of head, initial encounter: Secondary | ICD-10-CM | POA: Diagnosis not present

## 2021-12-04 DIAGNOSIS — R531 Weakness: Secondary | ICD-10-CM | POA: Diagnosis not present

## 2021-12-04 LAB — CBC WITH DIFFERENTIAL/PLATELET
Abs Immature Granulocytes: 0.07 10*3/uL (ref 0.00–0.07)
Basophils Absolute: 0.1 10*3/uL (ref 0.0–0.1)
Basophils Relative: 1 %
Eosinophils Absolute: 0.4 10*3/uL (ref 0.0–0.5)
Eosinophils Relative: 4 %
HCT: 34.9 % — ABNORMAL LOW (ref 39.0–52.0)
Hemoglobin: 11.2 g/dL — ABNORMAL LOW (ref 13.0–17.0)
Immature Granulocytes: 1 %
Lymphocytes Relative: 10 %
Lymphs Abs: 1 10*3/uL (ref 0.7–4.0)
MCH: 31.5 pg (ref 26.0–34.0)
MCHC: 32.1 g/dL (ref 30.0–36.0)
MCV: 98.3 fL (ref 80.0–100.0)
Monocytes Absolute: 0.8 10*3/uL (ref 0.1–1.0)
Monocytes Relative: 8 %
Neutro Abs: 7.5 10*3/uL (ref 1.7–7.7)
Neutrophils Relative %: 76 %
Platelets: 236 10*3/uL (ref 150–400)
RBC: 3.55 MIL/uL — ABNORMAL LOW (ref 4.22–5.81)
RDW: 15.7 % — ABNORMAL HIGH (ref 11.5–15.5)
WBC: 9.9 10*3/uL (ref 4.0–10.5)
nRBC: 0 % (ref 0.0–0.2)

## 2021-12-04 LAB — BASIC METABOLIC PANEL
Anion gap: 13 (ref 5–15)
BUN: 22 mg/dL (ref 8–23)
CO2: 28 mmol/L (ref 22–32)
Calcium: 7.9 mg/dL — ABNORMAL LOW (ref 8.9–10.3)
Chloride: 96 mmol/L — ABNORMAL LOW (ref 98–111)
Creatinine, Ser: 4.51 mg/dL — ABNORMAL HIGH (ref 0.61–1.24)
GFR, Estimated: 12 mL/min — ABNORMAL LOW (ref >60)
Glucose, Bld: 90 mg/dL (ref 70–99)
Potassium: 4.4 mmol/L (ref 3.5–5.1)
Sodium: 137 mmol/L (ref 135–145)

## 2021-12-04 MED ORDER — ACETAMINOPHEN 500 MG PO TABS
1000.0000 mg | ORAL_TABLET | Freq: Once | ORAL | Status: AC
  Administered 2021-12-04: 1000 mg via ORAL
  Filled 2021-12-04: qty 2

## 2021-12-04 MED ORDER — HYDROXYZINE HCL 10 MG PO TABS
10.0000 mg | ORAL_TABLET | Freq: Once | ORAL | Status: AC
  Administered 2021-12-04: 10 mg via ORAL
  Filled 2021-12-04: qty 1

## 2021-12-04 NOTE — ED Notes (Signed)
Attempted calling report to the facility x2

## 2021-12-04 NOTE — Discharge Instructions (Signed)
We evaluated you after your fall.  Your CT scans were negative for any injury.  Your lab tests were also reassuring.  Please be careful when standing up suddenly to avoid further falls.  Given your age you are at high risk for having serious injury from your fall.  Please return to the emergency department if you develop any new or worsening symptoms, such as severe headaches, vomiting, abdominal pain, or any other concerning symptoms.

## 2021-12-04 NOTE — ED Provider Notes (Signed)
[facility] EMERGENCY DEPARTMENT Provider Note  CSN: [id number] Arrival date & time: 12/04/21 1217  Chief Complaint(s) Fall  HPI Wayne Meadows is a 87 y.o. male with a history of COPD on chronic oxygen, coronary artery disease, prior stroke, end-stage renal disease on dialysis presenting to the emergency department with fall.  Patient reports that he was at his facility when he went to get some items from his closet, he stood up and fell backwards as he lost his balance.  Denies loss of consciousness.  Denies any preceding lightheadedness or dizziness.  Reports chronic balance issues.  He reports he asked someone for help but they told him he needed to get it out himself.  Denies any ongoing headaches reports a bump on the back of his head.  No neck pain.  Reports some mild acute on chronic low back pain.  No numbness or tingling.  Has been able to ambulate.  No nausea, vomiting.  No abdominal pain or chest pain.   Past Medical History Past Medical History:  Diagnosis Date   Abnormal echocardiogram 03/08/2021   Abnormal finding on GI tract imaging    Acute blood loss anemia    Acute pancreatitis 08/12/2020   Arteriovenous fistula infection ([place]) 04/18/2021   Arthritis    Carotid stenosis    Community acquired pneumonia 04/06/2021   COPD (chronic obstructive pulmonary disease) ([place])    Coronary artery disease    S/p PCI 2011;  NSTEMI 12/12:  LHC/PCI 02/23/11: LAD 60% after the septal perforator, D1 occluded with distal collaterals, proximal RI 30-40%, AV circumflex stent patent with 60% stenosis after the stent, RCA 99%, EF 60-65%.  His RCA was treated with a bare-metal stent   CVA (cerebral infarction) 2011   Right cerebral; total obstruction of the right ICA   Diverticulitis    Hypertension    Paroxysmal atrial fibrillation with RVR ([place]) 04/09/2021   Partial small bowel obstruction ([place]) 08/17/2021   Pleuritic chest pain 04/08/2014   Pressure injury of skin 03/30/2021    Rash 03/05/2021   Rectal bleeding 07/2015   Refractory nausea and vomiting 06/20/2020   Renal carcinoma ([place])    Sepsis, unspecified organism ([place]) 04/11/2016   Stroke ([place])    Tobacco abuse, in remission    Unstable angina ([place]) 03/18/2011   Patient Active Problem List   Diagnosis Date Noted   Hematemesis with nausea    Erosive esophagitis    Hiatal hernia    UGI bleed 11/05/2021   Lives in nursing home - [name] 11/05/2021   COVID-19 virus infection 11/05/2021   Thoracic compression fracture, sequela 10/30/2021   Multiple fractures of ribs, bilateral, initial encounter for closed fracture 10/30/2021   Back pain    Chronic respiratory failure with hypoxia (HCC) - 2.5 L/min continuously 09/28/2021   Paroxysmal atrial flutter ([place]) 09/28/2021   ESRD (end stage renal disease) ([place]) 09/11/2021   Gastric ulcer without hemorrhage or perforation    Duodenal ulcer    ESRD (end stage renal disease) on MWF dialysis ([place]) 04/06/2021   Proteinuria 03/07/2021   Acquired hypothyroidism 03/05/2021   Anxiety 03/05/2021   Chronic pancreatitis ([place]) 12/24/2020   Esophageal stricture    Benign neoplasm of transverse colon    Benign neoplasm of colon    Dysphagia    Renal cell cancer ([place])    Essential hypertension    Centrilobular emphysema ([place])    Cholelithiasis 04/26/2011   Paroxysmal atrial fibrillation (HCC) - not on systemic anticoagulation due  to fall risk 04/26/2011   Lung nodule 03/02/2011   Liver lesion 03/02/2011   Other hyperlipidemia 03/02/2011   Abnormal CT of the abdomen 02/23/2011   Non Q wave myocardial infarction [facility]) 02/22/2011    Class: Acute   History of CVA (cerebrovascular accident) 02/22/2011   CAD (coronary artery disease) 02/22/2011   COPD (chronic obstructive pulmonary disease) ([place]) 02/22/2011   Tobacco abuse, 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/22/2011   Home Medication(s) Prior to Admission medications   Medication Sig Start Date End Date Taking? Authorizing Provider   acetaminophen (TYLENOL) 325 MG tablet Take 2 tablets (650 mg total) by mouth every 6 (six) hours as needed for mild pain (or Fever >/= 101). Patient taking differently: Take 650 mg by mouth every 6 (six) hours as needed for mild pain (fever > 101). 04/10/21   Bonnielee Haff, MD  Amino Acids-Protein Hydrolys (PRO-STAT SUGAR FREE PO) Take 30 mLs by mouth every evening.    [provider]  antiseptic oral rinse (BIOTENE) LIQD 10 mLs by Mouth Rinse route 2 (two) times daily.    [provider]  benzonatate (TESSALON) 100 MG capsule Take 1 capsule (100 mg total) by mouth 3 (three) times daily. 10/30/21   Lavina Hamman, MD  busPIRone (BUSPAR) 5 MG tablet Take 1 tablet (5 mg total) by mouth 3 (three) times daily. (0800 & 2000) Patient taking differently: Take 5 mg by mouth 3 (three) times daily. 10/30/21   Lavina Hamman, MD  Darbepoetin Alfa (ARANESP) 60 MCG/0.3ML SOSY injection Inject 0.3 mLs (60 mcg total) into the vein every Monday with hemodialysis. 09/30/21   Cherene Altes, MD  dextromethorphan-guaiFENesin (MUCINEX DM) 30-600 MG 12hr tablet Take 1 tablet by mouth 2 (two) times daily. 10/30/21   Lavina Hamman, MD  diltiazem (CARDIZEM CD) 240 MG 24 hr capsule Take 1 capsule (240 mg total) by mouth daily. 10/30/21   Lavina Hamman, MD  HYDROcodone-acetaminophen (NORCO) 10-325 MG tablet Take 1 tablet by mouth every 4 (four) hours as needed for severe pain or moderate pain. 10/30/21   Lavina Hamman, MD  Ipratropium-Albuterol (COMBIVENT) 20-100 MCG/ACT AERS respimat Inhale 1 puff into the lungs 4 (four) times daily.    [provider]  ipratropium-albuterol (DUONEB) 0.5-2.5 (3) MG/3ML SOLN Take 3 mLs by nebulization every 4 (four) hours as needed (shortness of breath, wheezing).    [provider]  levothyroxine (SYNTHROID) 50 MCG tablet Take 50 mcg by mouth daily before breakfast.    [provider]  lidocaine 4 % Place 1 patch onto the skin daily. To back     [provider]  melatonin 5 MG TABS Take 5 mg by mouth at bedtime. 09/30/21   [provider]  Menthol, Topical Analgesic, (BIOFREEZE) 4 % GEL Apply 1 Application topically 3 (three) times daily as needed (pain).    [provider]  metoprolol tartrate (LOPRESSOR) 25 MG tablet Take 0.5 tablets (12.5 mg total) by mouth 2 (two) times daily. 10/30/21   Lavina Hamman, MD  molnupiravir EUA (LAGEVRIO) 200 MG CAPS capsule Take 4 capsules by mouth 2 (two) times daily.    [provider]  Multiple Vitamins-Minerals (DECUBI-VITE) CAPS Take 1 capsule by mouth daily.    [provider]  naphazoline-glycerin (CLEAR EYES REDNESS) 0.012-0.25 % SOLN Place 1-2 drops into the right eye 3 (three) times daily. 04/23/21   Alma Friendly, MD  neomycin-bacitracin-polymyxin (NEOSPORIN) OINT Apply 1 application topically 3 (three) times daily. To right  antecubital area 04/10/21   Bonnielee Haff, MD  Nutritional Supplements (NOVASOURCE RENAL) LIQD Take 237 mLs by mouth in the morning and at bedtime.    [provider]  pantoprazole (PROTONIX) 40 MG tablet Take 1 tablet (40 mg total) by mouth 2 (two) times daily for 60 days, THEN 1 tablet (40 mg total) daily. 11/06/21 04/05/22  Mercy Riding, MD  polyethylene glycol (MIRALAX / GLYCOLAX) 17 g packet Take 17 g by mouth daily. 05/01/21   Sherwood Gambler, MD  pravastatin (PRAVACHOL) 40 MG tablet Take 40 mg by mouth daily. (2100)    [provider]  predniSONE (DELTASONE) 10 MG tablet Take 10-40 mg by mouth See admin instructions. 40 mg daily for 3 days, 30 mg daily for 3 days,  20 mg daily for 3 days,  10 mg daily for 3 days.    [provider]  senna (SENOKOT) 8.6 MG tablet Take 8.6 mg by mouth 2 (two) times daily. 10/15/21   [provider]  sodium chloride (OCEAN) 0.65 % SOLN nasal spray Place 2 sprays into both nostrils 2 (two) times daily.    [provider]  tamsulosin (FLOMAX) 0.4 MG  CAPS capsule Take 1 capsule (0.4 mg total) by mouth daily. 04/11/21   Bonnielee Haff, MD                                                                                                                                    Past Surgical History Past Surgical History:  Procedure Laterality Date   AV FISTULA PLACEMENT Right 04/03/2021   Procedure: ARTERIOVENOUS (AV) CREATION OF RIGHT ARM BRACHIOCEPHALIC FISTULA;  Surgeon: Cherre Robins, MD;  Location[facility] OR;  Service: Vascular;  Laterality: Right;   BIOPSY  04/08/2021   Procedure: BIOPSY;  Surgeon: Lavena Bullion, DO;  Location: [place];  Service: Gastroenterology;;   BIOPSY  11/06/2021   Procedure: BIOPSY;  Surgeon: Lavena Bullion, DO;  Location: [place];  Service: Gastroenterology;;   COLON SURGERY     COLONOSCOPY N/A 08/22/2015   Procedure: COLONOSCOPY;  Surgeon: [name], MD;  Location: [place] ENDOSCOPY;  Service: Endoscopy;  Laterality: N/A;   ESOPHAGOGASTRODUODENOSCOPY N/A 07/18/2020   Procedure: ESOPHAGOGASTRODUODENOSCOPY (EGD);  Surgeon: Milus Banister, MD;  Location: [facility];  Service: Endoscopy;  Laterality: N/A;   ESOPHAGOGASTRODUODENOSCOPY (EGD) WITH PROPOFOL N/A 06/21/2020   Procedure: ESOPHAGOGASTRODUODENOSCOPY (EGD) WITH PROPOFOL;  Surgeon: Irene Shipper, MD;  Location[facility] ENDOSCOPY;  Service: Endoscopy;  Laterality: N/A;   ESOPHAGOGASTRODUODENOSCOPY (EGD) WITH PROPOFOL N/A 04/08/2021   Procedure: ESOPHAGOGASTRODUODENOSCOPY (EGD) WITH PROPOFOL;  Surgeon: Lavena Bullion, DO;  Location: [place];  Service: Gastroenterology;  Laterality: N/A;   ESOPHAGOGASTRODUODENOSCOPY (EGD) WITH PROPOFOL N/A 11/06/2021   Procedure: ESOPHAGOGASTRODUODENOSCOPY (EGD) WITH PROPOFOL;  Surgeon: Lavena Bullion, DO;  Location: [place];  Service: Gastroenterology;  Laterality: N/A;   EUS N/A 07/18/2020   Procedure: UPPER ENDOSCOPIC  ULTRASOUND (EUS) RADIAL;  Surgeon: Milus Banister, MD;  Location: [facility] ENDOSCOPY;   Service: Endoscopy;  Laterality: N/A;   FINE NEEDLE ASPIRATION N/A 07/18/2020   Procedure: FINE NEEDLE ASPIRATION (FNA) LINEAR;  Surgeon: Milus Banister, MD;  Location: [facility] ENDOSCOPY;  Service: Endoscopy;  Laterality: N/A;   HEMOSTASIS CLIP PLACEMENT  04/08/2021   Procedure: HEMOSTASIS CLIP PLACEMENT;  Surgeon: Lavena Bullion, DO;  Location: [place];  Service: Gastroenterology;;   hip relacement     INCISION AND DRAINAGE Right 09/11/2021   Procedure: INCISION AND DRAINAGE OF RIGHT ARM FISTULA;  Surgeon: Cherre Robins, MD;  Location: [facility] OR;  Service: Vascular;  Laterality: Right;   IR FLUORO GUIDE CV LINE RIGHT  03/31/2021   IR REMOVAL TUN CV CATH W/O FL  08/15/2021   IR US GUIDE VASC ACCESS RIGHT  03/31/2021   KIDNEY SURGERY     LEFT HEART CATH AND CORONARY ANGIOGRAPHY N/A 07/09/2020   Procedure: LEFT HEART CATH AND CORONARY ANGIOGRAPHY;  Surgeon: Leonie Man, MD;  Location: [place] CV LAB;  Service: Cardiovascular;  Laterality: N/A;   LEFT HEART CATHETERIZATION WITH CORONARY ANGIOGRAM N/A 02/23/2011   Procedure: LEFT HEART CATHETERIZATION WITH CORONARY ANGIOGRAM;  Surgeon: [name], MD;  Location: [facility] CATH LAB;  Service: Cardiovascular;  Laterality: N/A;   LEFT HEART CATHETERIZATION WITH CORONARY ANGIOGRAM N/A 03/18/2011   Procedure: LEFT HEART CATHETERIZATION WITH CORONARY ANGIOGRAM;  Surgeon: [name], MD;  Location: [facility] CATH LAB;  Service: Cardiovascular;  Laterality: N/A;   PERCUTANEOUS CORONARY STENT INTERVENTION (PCI-S) N/A 02/23/2011   Procedure: PERCUTANEOUS CORONARY STENT INTERVENTION (PCI-S);  Surgeon: [name], MD;  Location: [facility] CATH LAB;  Service: Cardiovascular;  Laterality: N/A;   TEMPORARY PACEMAKER INSERTION N/A 02/23/2011   Procedure: TEMPORARY PACEMAKER INSERTION;  Surgeon: [name], MD;  Location: [facility] CATH LAB;  Service: Cardiovascular;  Laterality: N/A;   THROMBECTOMY W/ EMBOLECTOMY Right 04/03/2021   Procedure: THROMBECTOMY ARTERIOVENOUS  FISTULA;  Surgeon: Cherre Robins, MD;  Location: [facility] OR;  Service: Vascular;  Laterality: Right;   Family History Family History  Problem Relation Age of Onset   Heart attack Other 18    Social History Social History   Tobacco Use   Smoking status: Former    Types: Cigarettes    Quit date: 02/24/2007    Years since quitting: 14.7   Smokeless tobacco: Never  Vaping Use   Vaping Use: Never used  Substance Use Topics   Alcohol use: Not Currently    Alcohol/week: 1.0 standard drink of alcohol    Types: 1 Standard drinks or equivalent per week    Comment: socially    Drug use: No   Allergies Bee venom and Influenza vaccines  Review of Systems Review of Systems  All other systems reviewed and are negative.   Physical Exam Vital Signs  I have reviewed the triage vital signs BP (!) 160/58 (BP Location: Left Arm)   Pulse 77   Temp 98.4 F (36.9 C) (Oral)   Resp 16   Ht 6' (1.829 m)   Wt 92.5 kg   SpO2 99%   BMI 27.67 kg/m  Physical Exam Vitals and nursing note reviewed.  Constitutional:      General: He is not in acute distress.    Appearance: Normal appearance.  HENT:     Head: Normocephalic.     Comments: Soft tissue swelling to the right posterior scalp, no wound or laceration    Mouth/Throat:  Mouth: Mucous membranes are moist.  Eyes:     Conjunctiva/sclera: Conjunctivae normal.  Neck:     Comments: No midline C or T-spine tenderness.  Mild midline L-spine tenderness, no step-off Cardiovascular:     Rate and Rhythm: Normal rate and regular rhythm.  Pulmonary:     Effort: Pulmonary effort is normal. No respiratory distress.     Breath sounds: Normal breath sounds.  Abdominal:     General: Abdomen is flat.     Palpations: Abdomen is soft.     Tenderness: There is no abdominal tenderness.  Musculoskeletal:     Right lower leg: No edema.     Left lower leg: No edema.     Comments: Full range of motion of the bilateral upper and lower extremities  without focal tenderness or pain  Skin:    General: Skin is warm and dry.     Capillary Refill: Capillary refill takes less than 2 seconds.  Neurological:     Mental Status: He is alert and oriented to person, place, and time. Mental status is at baseline.  Psychiatric:        Mood and Affect: Mood normal.        Behavior: Behavior normal.     ED Results and Treatments Labs (all labs ordered are listed, but only abnormal results are displayed) Labs Reviewed  BASIC METABOLIC PANEL - Abnormal; Notable for the following components:      Result Value   Chloride 96 (*)    Creatinine, Ser 4.51 (*)    Calcium 7.9 (*)    GFR, Estimated 12 (*)    All other components within normal limits  CBC WITH DIFFERENTIAL/PLATELET - Abnormal; Notable for the following components:   RBC 3.55 (*)    Hemoglobin 11.2 (*)    HCT 34.9 (*)    RDW 15.7 (*)    All other components within normal limits                                                                                                                          Radiology DG Chest 2 View  Result Date: 12/04/2021 CLINICAL DATA:  chest pain EXAM: CHEST - 2 VIEW COMPARISON:  Chest x-ray 10/29/2021. FINDINGS: Moderate left and small right pleural effusions. Overlying bibasilar opacities. No visible pneumothorax. Cardiomediastinal silhouette is within normal limits. No evidence of acute osseous abnormality. IMPRESSION: Moderate left and small right pleural effusions with overlying bibasilar atelectasis and/or consolidation. Electronically Signed   By: Margaretha Sheffield M.D.   On: 12/04/2021 14:26   CT Head Wo Contrast  Result Date: 12/04/2021 CLINICAL DATA:  Trauma EXAM: CT HEAD WITHOUT CONTRAST CT CERVICAL SPINE WITHOUT CONTRAST TECHNIQUE: Multidetector CT imaging of the head and cervical spine was performed following the standard protocol without intravenous contrast. Multiplanar CT image reconstructions of the cervical spine were also generated.  RADIATION DOSE REDUCTION: This exam was performed according to the departmental dose-optimization program which includes automated exposure control,  adjustment of the mA and/or kV according to patient size and/or use of iterative reconstruction technique. COMPARISON:  CT Neck 08/09/15, CT Head 08/09/15 FINDINGS: CT HEAD FINDINGS Brain: Redemonstrated chronic infarct in the right centrum semiovale. No evidence of acute infarction, hemorrhage, hydrocephalus, extra-axial collection or mass lesion/mass effect. Vascular: No hyperdense vessel or unexpected calcification. Skull: Normal. Negative for fracture or focal lesion. Sinuses/Orbits: Air-fluid level in bilateral maxillary sinuses. There is mucosal thickening ethmoid air cells. No mastoid effusion. Middle ear effusion. There are degenerative changes of the left TMJ. Other: Mild soft tissues stranding over the midline posterior scalp. CT CERVICAL SPINE FINDINGS Limitations: Assessment is limited due to motion artifact. Alignment: Grade 1 anterolisthesis of C3 on C4, likely secondary to progressive degenerative changes. Skull base and vertebrae: No acute fracture. No primary bone lesion or focal pathologic process. Soft tissues and spinal canal: No prevertebral fluid or swelling. No visible canal hematoma. Disc levels:  There is at least mild spinal canal stenosis at C5-C6. Upper chest: Moderate left pleural effusion. Other: Incidentally noted is a 1.5 cm left thyroid nodule. IMPRESSION: CT HEAD: 1. No acute intracranial abnormality. 2. Air-fluid level in bilateral maxillary sinuses, correlate for acute sinusitis. 3. Mild soft tissue swelling over the midline occipital scalp. CT CERVICAL SPINE: 1. Assessment is limited due to motion artifact. Within this limitaiton, no acute cervical spine fracture. 2. Moderate left pleural effusion. 3. Incidentally noted 1.5 cm left thyroid nodule. Recommend further evaluation with a thyroid ultrasound, if not previously performed.  Electronically Signed   By: Marin Roberts M.D.   On: 12/04/2021 13:59   CT Cervical Spine Wo Contrast  Result Date: 12/04/2021 CLINICAL DATA:  Trauma EXAM: CT HEAD WITHOUT CONTRAST CT CERVICAL SPINE WITHOUT CONTRAST TECHNIQUE: Multidetector CT imaging of the head and cervical spine was performed following the standard protocol without intravenous contrast. Multiplanar CT image reconstructions of the cervical spine were also generated. RADIATION DOSE REDUCTION: This exam was performed according to the departmental dose-optimization program which includes automated exposure control, adjustment of the mA and/or kV according to patient size and/or use of iterative reconstruction technique. COMPARISON:  CT Neck 08/09/15, CT Head 08/09/15 FINDINGS: CT HEAD FINDINGS Brain: Redemonstrated chronic infarct in the right centrum semiovale. No evidence of acute infarction, hemorrhage, hydrocephalus, extra-axial collection or mass lesion/mass effect. Vascular: No hyperdense vessel or unexpected calcification. Skull: Normal. Negative for fracture or focal lesion. Sinuses/Orbits: Air-fluid level in bilateral maxillary sinuses. There is mucosal thickening ethmoid air cells. No mastoid effusion. Middle ear effusion. There are degenerative changes of the left TMJ. Other: Mild soft tissues stranding over the midline posterior scalp. CT CERVICAL SPINE FINDINGS Limitations: Assessment is limited due to motion artifact. Alignment: Grade 1 anterolisthesis of C3 on C4, likely secondary to progressive degenerative changes. Skull base and vertebrae: No acute fracture. No primary bone lesion or focal pathologic process. Soft tissues and spinal canal: No prevertebral fluid or swelling. No visible canal hematoma. Disc levels:  There is at least mild spinal canal stenosis at C5-C6. Upper chest: Moderate left pleural effusion. Other: Incidentally noted is a 1.5 cm left thyroid nodule. IMPRESSION: CT HEAD: 1. No acute intracranial abnormality.  2. Air-fluid level in bilateral maxillary sinuses, correlate for acute sinusitis. 3. Mild soft tissue swelling over the midline occipital scalp. CT CERVICAL SPINE: 1. Assessment is limited due to motion artifact. Within this limitaiton, no acute cervical spine fracture. 2. Moderate left pleural effusion. 3. Incidentally noted 1.5 cm left thyroid nodule. Recommend further evaluation with  a thyroid ultrasound, if not previously performed. Electronically Signed   By: Marin Roberts M.D.   On: 12/04/2021 13:59   CT Lumbar Spine Wo Contrast  Result Date: 12/04/2021 CLINICAL DATA:  Fall EXAM: CT LUMBAR SPINE WITHOUT CONTRAST TECHNIQUE: Multidetector CT imaging of the lumbar spine was performed without intravenous contrast administration. Multiplanar CT image reconstructions were also generated. RADIATION DOSE REDUCTION: This exam was performed according to the departmental dose-optimization program which includes automated exposure control, adjustment of the mA and/or kV according to patient size and/or use of iterative reconstruction technique. COMPARISON:  No prior dedicated CT of the lumbar spine, correlation is made with CT abdomen pelvis 05/01/2021 and MRI lumbar spine 04/27/2005 FINDINGS: Segmentation: 5 lumbar-type vertebral bodies. Alignment: Unchanged focal kyphosis centered at L1-L2, secondary to a remote L2 compression deformity. No traumatic listhesis. Mild S shaped curvature of the thoracolumbar spine. Vertebrae: No acute fracture or suspicious osseous lesion. Posterior fusion L2-S1. The pedicle screws of L5 and S1 extend past the anterior cortex of these vertebral bodies. Paraspinal and other soft tissues: Aortic atherosclerosis. Left pleural effusion. Multiple low-density right right-greater-than-left renal lesions, most likely renal cysts, which appear similar to 05/01/2021, and for which no follow-up currently indicated. Disc levels: No high-grade spinal canal stenosis or neural foraminal narrowing.  IMPRESSION: 1. No acute fracture or traumatic listhesis. 2. Left pleural effusion. 3. Aortic atherosclerosis. Aortic Atherosclerosis (ICD10-I70.0). Electronically Signed   By: Merilyn Baba M.D.   On: 12/04/2021 13:58    Pertinent labs & imaging results that were available during my care of the patient were reviewed by me and considered in my medical decision making (see MDM for details).  Medications Ordered in ED Medications  acetaminophen (TYLENOL) tablet 1,000 mg (1,000 mg Oral Given 12/04/21 1352)                                                                                                                                     Procedures Procedures  (including critical care time)  Medical Decision Making / ED Course   MDM:  87 year old male presenting after fall.  Patient well-appearing, physical exam with some low back midline tenderness, bump on the back of the head.  Differential includes intracranial bleeding, skull fracture, subdural hematoma, lumbar spine fracture, spinal cord injury.  CT scan of the head and neck reveals soft tissue injury without underlying fracture or intracranial bleeding.  CT scan of the lumbar spine reveals no acute fracture.  Doubt spinal cord injury with normal ambulation and no fracture.  No evidence of muscular total injury on exam.  Chest x-ray with chronic bilateral pleural effusions.  I independently interpreted and visualized imaging and agree with radiology interpretation.  We will obtain basic labs although low concern for acute process.  Neurologic exam overall reassuring, doubt vertigo as cause of patient's fall.  He reports balance issues are chronic problem.  Clinical Course as of 12/04/21 1607  Thu Dec 04, 2021  1607 Imaging negative. Labs reassuring. Will discharge patient to home. All questions answered. Patient comfortable with plan of discharge. Return precautions discussed with patient and specified on the after visit summary.  [WS]     Clinical Course User Index [WS] Cristie Hem, MD     Additional history obtained: -Additional history obtained from ems -External records from outside source obtained and reviewed including: Chart review including previous notes, labs, imaging, consultation notes   Lab Tests: -I ordered, reviewed, and interpreted labs.   The pertinent results include:   Labs Reviewed  BASIC METABOLIC PANEL - Abnormal; Notable for the following components:      Result Value   Chloride 96 (*)    Creatinine, Ser 4.51 (*)    Calcium 7.9 (*)    GFR, Estimated 12 (*)    All other components within normal limits  CBC WITH DIFFERENTIAL/PLATELET - Abnormal; Notable for the following components:   RBC 3.55 (*)    Hemoglobin 11.2 (*)    HCT 34.9 (*)    RDW 15.7 (*)    All other components within normal limits       Imaging Studies ordered: I ordered imaging studies including CT head, neck, L spine, CXR On my interpretation imaging demonstrates no acute process, soft tissue injury I independently visualized and interpreted imaging. I agree with the radiologist interpretation   Medicines ordered and prescription drug management: Meds ordered this encounter  Medications   acetaminophen (TYLENOL) tablet 1,000 mg    -I have reviewed the patients home medicines and have made adjustments as needed   Social Determinants of Health:  Factors impacting patients care include: former smoker   Reevaluation: After the interventions noted above, I reevaluated the patient and found that they have improved  Co morbidities that complicate the patient evaluation  Past Medical History:  Diagnosis Date   Abnormal echocardiogram 03/08/2021   Abnormal finding on GI tract imaging    Acute blood loss anemia    Acute pancreatitis 08/12/2020   Arteriovenous fistula infection ([place]) 04/18/2021   Arthritis    Carotid stenosis    Community acquired pneumonia 04/06/2021   COPD (chronic obstructive pulmonary  disease) ([place])    Coronary artery disease    S/p PCI 2011;  NSTEMI 12/12:  LHC/PCI 02/23/11: LAD 60% after the septal perforator, D1 occluded with distal collaterals, proximal RI 30-40%, AV circumflex stent patent with 60% stenosis after the stent, RCA 99%, EF 60-65%.  His RCA was treated with a bare-metal stent   CVA (cerebral infarction) 2011   Right cerebral; total obstruction of the right ICA   Diverticulitis    Hypertension    Paroxysmal atrial fibrillation with RVR ([place]) 04/09/2021   Partial small bowel obstruction ([place]) 08/17/2021   Pleuritic chest pain 04/08/2014   Pressure injury of skin 03/30/2021   Rash 03/05/2021   Rectal bleeding 07/2015   Refractory nausea and vomiting 06/20/2020   Renal carcinoma (HCC)    Sepsis, unspecified organism ([place]) 04/11/2016   Stroke ([place])    Tobacco abuse, in remission    Unstable angina ([place]) 03/18/2011      Dispostion: Discharge    Final Clinical Impression(s) / ED Diagnoses Final diagnoses:  Fall, initial encounter  Contusion of scalp, initial encounter     This chart was dictated using voice recognition software.  Despite best efforts to proofread,  errors can occur which can change the documentation meaning.    Cristie Hem, MD 12/04/21  1607  

## 2021-12-04 NOTE — ED Triage Notes (Signed)
Pt bib by PTAR, [place] pt was leaning over to get his laundry. His left leg gave out on him and he fell backwards. Abrasion on the back of his head. Bleeding controlled. Pt does have a sore on lower back. No active bleeding. Pt stated when he fell "it pulled on the area" No LOC, dizziness  Pt c/o headache.  Dialysis port on right arm. MWF schedule.  No missed dialysis.  Pt is A&O x4  152/70  77HR 95% 3L COPD 20RR

## 2021-12-04 NOTE — ED Notes (Signed)
Attempted to call report to facility x1

## 2021-12-05 DIAGNOSIS — J9611 Chronic respiratory failure with hypoxia: Secondary | ICD-10-CM | POA: Diagnosis not present

## 2021-12-05 DIAGNOSIS — J449 Chronic obstructive pulmonary disease, unspecified: Secondary | ICD-10-CM | POA: Diagnosis not present

## 2021-12-05 DIAGNOSIS — N186 End stage renal disease: Secondary | ICD-10-CM | POA: Diagnosis not present

## 2021-12-05 DIAGNOSIS — S2243XS Multiple fractures of ribs, bilateral, sequela: Secondary | ICD-10-CM | POA: Diagnosis not present

## 2021-12-05 DIAGNOSIS — S22060S Wedge compression fracture of T7-T8 vertebra, sequela: Secondary | ICD-10-CM | POA: Diagnosis not present

## 2021-12-05 DIAGNOSIS — J690 Pneumonitis due to inhalation of food and vomit: Secondary | ICD-10-CM | POA: Diagnosis not present

## 2021-12-08 ENCOUNTER — Ambulatory Visit: Payer: No Typology Code available for payment source | Admitting: Cardiovascular Disease

## 2021-12-08 DIAGNOSIS — S2243XS Multiple fractures of ribs, bilateral, sequela: Secondary | ICD-10-CM | POA: Diagnosis not present

## 2021-12-08 DIAGNOSIS — N186 End stage renal disease: Secondary | ICD-10-CM | POA: Diagnosis not present

## 2021-12-08 DIAGNOSIS — J9611 Chronic respiratory failure with hypoxia: Secondary | ICD-10-CM | POA: Diagnosis not present

## 2021-12-08 DIAGNOSIS — J690 Pneumonitis due to inhalation of food and vomit: Secondary | ICD-10-CM | POA: Diagnosis not present

## 2021-12-08 DIAGNOSIS — S22060S Wedge compression fracture of T7-T8 vertebra, sequela: Secondary | ICD-10-CM | POA: Diagnosis not present

## 2021-12-08 DIAGNOSIS — J449 Chronic obstructive pulmonary disease, unspecified: Secondary | ICD-10-CM | POA: Diagnosis not present

## 2021-12-09 DIAGNOSIS — L299 Pruritus, unspecified: Secondary | ICD-10-CM | POA: Diagnosis not present

## 2021-12-09 DIAGNOSIS — J449 Chronic obstructive pulmonary disease, unspecified: Secondary | ICD-10-CM | POA: Diagnosis not present

## 2021-12-09 DIAGNOSIS — G894 Chronic pain syndrome: Secondary | ICD-10-CM | POA: Diagnosis not present

## 2021-12-09 DIAGNOSIS — S2243XS Multiple fractures of ribs, bilateral, sequela: Secondary | ICD-10-CM | POA: Diagnosis not present

## 2021-12-09 DIAGNOSIS — I480 Paroxysmal atrial fibrillation: Secondary | ICD-10-CM | POA: Diagnosis not present

## 2021-12-09 DIAGNOSIS — I120 Hypertensive chronic kidney disease with stage 5 chronic kidney disease or end stage renal disease: Secondary | ICD-10-CM | POA: Diagnosis not present

## 2021-12-09 DIAGNOSIS — N186 End stage renal disease: Secondary | ICD-10-CM | POA: Diagnosis not present

## 2021-12-09 DIAGNOSIS — J9611 Chronic respiratory failure with hypoxia: Secondary | ICD-10-CM | POA: Diagnosis not present

## 2021-12-09 DIAGNOSIS — F419 Anxiety disorder, unspecified: Secondary | ICD-10-CM | POA: Diagnosis not present

## 2021-12-09 DIAGNOSIS — S22060S Wedge compression fracture of T7-T8 vertebra, sequela: Secondary | ICD-10-CM | POA: Diagnosis not present

## 2021-12-09 DIAGNOSIS — J690 Pneumonitis due to inhalation of food and vomit: Secondary | ICD-10-CM | POA: Diagnosis not present

## 2021-12-10 DIAGNOSIS — N186 End stage renal disease: Secondary | ICD-10-CM | POA: Diagnosis not present

## 2021-12-10 DIAGNOSIS — J690 Pneumonitis due to inhalation of food and vomit: Secondary | ICD-10-CM | POA: Diagnosis not present

## 2021-12-10 DIAGNOSIS — S22060S Wedge compression fracture of T7-T8 vertebra, sequela: Secondary | ICD-10-CM | POA: Diagnosis not present

## 2021-12-10 DIAGNOSIS — J9611 Chronic respiratory failure with hypoxia: Secondary | ICD-10-CM | POA: Diagnosis not present

## 2021-12-10 DIAGNOSIS — J449 Chronic obstructive pulmonary disease, unspecified: Secondary | ICD-10-CM | POA: Diagnosis not present

## 2021-12-10 DIAGNOSIS — S2243XS Multiple fractures of ribs, bilateral, sequela: Secondary | ICD-10-CM | POA: Diagnosis not present

## 2021-12-11 DIAGNOSIS — E1122 Type 2 diabetes mellitus with diabetic chronic kidney disease: Secondary | ICD-10-CM | POA: Diagnosis not present

## 2021-12-11 DIAGNOSIS — G894 Chronic pain syndrome: Secondary | ICD-10-CM | POA: Diagnosis not present

## 2021-12-11 DIAGNOSIS — L299 Pruritus, unspecified: Secondary | ICD-10-CM | POA: Diagnosis not present

## 2021-12-11 DIAGNOSIS — F411 Generalized anxiety disorder: Secondary | ICD-10-CM | POA: Diagnosis not present

## 2021-12-11 DIAGNOSIS — J690 Pneumonitis due to inhalation of food and vomit: Secondary | ICD-10-CM | POA: Diagnosis not present

## 2021-12-11 DIAGNOSIS — S2243XS Multiple fractures of ribs, bilateral, sequela: Secondary | ICD-10-CM | POA: Diagnosis not present

## 2021-12-11 DIAGNOSIS — S22060S Wedge compression fracture of T7-T8 vertebra, sequela: Secondary | ICD-10-CM | POA: Diagnosis not present

## 2021-12-11 DIAGNOSIS — N186 End stage renal disease: Secondary | ICD-10-CM | POA: Diagnosis not present

## 2021-12-11 DIAGNOSIS — L89134 Pressure ulcer of right lower back, stage 4: Secondary | ICD-10-CM | POA: Diagnosis not present

## 2021-12-11 DIAGNOSIS — I120 Hypertensive chronic kidney disease with stage 5 chronic kidney disease or end stage renal disease: Secondary | ICD-10-CM | POA: Diagnosis not present

## 2021-12-11 DIAGNOSIS — J449 Chronic obstructive pulmonary disease, unspecified: Secondary | ICD-10-CM | POA: Diagnosis not present

## 2021-12-11 DIAGNOSIS — J9611 Chronic respiratory failure with hypoxia: Secondary | ICD-10-CM | POA: Diagnosis not present

## 2021-12-12 DIAGNOSIS — J9611 Chronic respiratory failure with hypoxia: Secondary | ICD-10-CM | POA: Diagnosis not present

## 2021-12-12 DIAGNOSIS — N186 End stage renal disease: Secondary | ICD-10-CM | POA: Diagnosis not present

## 2021-12-12 DIAGNOSIS — J449 Chronic obstructive pulmonary disease, unspecified: Secondary | ICD-10-CM | POA: Diagnosis not present

## 2021-12-12 DIAGNOSIS — S2243XS Multiple fractures of ribs, bilateral, sequela: Secondary | ICD-10-CM | POA: Diagnosis not present

## 2021-12-12 DIAGNOSIS — S22060S Wedge compression fracture of T7-T8 vertebra, sequela: Secondary | ICD-10-CM | POA: Diagnosis not present

## 2021-12-12 DIAGNOSIS — J690 Pneumonitis due to inhalation of food and vomit: Secondary | ICD-10-CM | POA: Diagnosis not present

## 2021-12-15 DIAGNOSIS — N186 End stage renal disease: Secondary | ICD-10-CM | POA: Diagnosis not present

## 2021-12-15 DIAGNOSIS — J690 Pneumonitis due to inhalation of food and vomit: Secondary | ICD-10-CM | POA: Diagnosis not present

## 2021-12-15 DIAGNOSIS — J9611 Chronic respiratory failure with hypoxia: Secondary | ICD-10-CM | POA: Diagnosis not present

## 2021-12-15 DIAGNOSIS — J449 Chronic obstructive pulmonary disease, unspecified: Secondary | ICD-10-CM | POA: Diagnosis not present

## 2021-12-15 DIAGNOSIS — S22060S Wedge compression fracture of T7-T8 vertebra, sequela: Secondary | ICD-10-CM | POA: Diagnosis not present

## 2021-12-15 DIAGNOSIS — S2243XS Multiple fractures of ribs, bilateral, sequela: Secondary | ICD-10-CM | POA: Diagnosis not present

## 2021-12-16 DIAGNOSIS — S2243XS Multiple fractures of ribs, bilateral, sequela: Secondary | ICD-10-CM | POA: Diagnosis not present

## 2021-12-16 DIAGNOSIS — N186 End stage renal disease: Secondary | ICD-10-CM | POA: Diagnosis not present

## 2021-12-16 DIAGNOSIS — J9611 Chronic respiratory failure with hypoxia: Secondary | ICD-10-CM | POA: Diagnosis not present

## 2021-12-16 DIAGNOSIS — S22060S Wedge compression fracture of T7-T8 vertebra, sequela: Secondary | ICD-10-CM | POA: Diagnosis not present

## 2021-12-16 DIAGNOSIS — J690 Pneumonitis due to inhalation of food and vomit: Secondary | ICD-10-CM | POA: Diagnosis not present

## 2021-12-16 DIAGNOSIS — J449 Chronic obstructive pulmonary disease, unspecified: Secondary | ICD-10-CM | POA: Diagnosis not present

## 2021-12-16 NOTE — Progress Notes (Unsigned)
Office Visit    Patient Name: Wayne Meadows Date of Encounter: 12/18/2021  Primary Care Provider:  Clinic, [name] Primary Cardiologist:  Sherren Mocha, MD Primary Electrophysiologist: None  Chief Complaint    Wayne Meadows is a 87 y.o. male with PMH of history of CAD s/p PCI with bare-metal stent to RCA, CVA with right ICA occlusion, PAF, ESRD, carotid disease, COPD, HTN, HLD who presents today for 72-monthfollow-up of coronary artery disease and atrial flutter.  Past Medical History    Past Medical History:  Diagnosis Date   Abnormal echocardiogram 03/08/2021   Abnormal finding on GI tract imaging    Acute blood loss anemia    Acute pancreatitis 08/12/2020   Arteriovenous fistula infection ([place] 04/18/2021   Arthritis    Carotid stenosis    Community acquired pneumonia 04/06/2021   COPD (chronic obstructive pulmonary disease) ([place]    Coronary artery disease    S/p PCI 2011;  NSTEMI 12/12:  LHC/PCI 02/23/11: LAD 60% after the septal perforator, D1 occluded with distal collaterals, proximal RI 30-40%, AV circumflex stent patent with 60% stenosis after the stent, RCA 99%, EF 60-65%.  His RCA was treated with a bare-metal stent   CVA (cerebral infarction) 2011   Right cerebral; total obstruction of the right ICA   Diverticulitis    Hypertension    Paroxysmal atrial fibrillation with RVR ([place] 04/09/2021   Partial small bowel obstruction ([place] 08/17/2021   Pleuritic chest pain 04/08/2014   Pressure injury of skin 03/30/2021   Rash 03/05/2021   Rectal bleeding 07/2015   Refractory nausea and vomiting 06/20/2020   Renal carcinoma ([place]    Sepsis, unspecified organism ([place] 04/11/2016   Stroke ([place]    Tobacco abuse, in remission    Unstable angina ([place] 03/18/2011   Past Surgical History:  Procedure Laterality Date   AV FISTULA PLACEMENT Right 04/03/2021   Procedure: ARTERIOVENOUS (AV) CREATION OF RIGHT ARM BRACHIOCEPHALIC FISTULA;  Surgeon: HCherre Robins MD;   [facility]  Service: Vascular;  Laterality: Right;   BIOPSY  04/08/2021   Procedure: BIOPSY;  Surgeon: CLavena Bullion DO;  [facility] [facility]  Service: Gastroenterology;;   BIOPSY  11/06/2021   Procedure: BIOPSY;  Surgeon: CLavena Bullion DO;  [facility]  Service: Gastroenterology;;   COLON SURGERY     COLONOSCOPY N/A 08/22/2015   Procedure: COLONOSCOPY;  Surgeon: KMauri Pole MD;  [facility];  Service: Endoscopy;  Laterality: N/A;   ESOPHAGOGASTRODUODENOSCOPY N/A 07/18/2020   Procedure: ESOPHAGOGASTRODUODENOSCOPY (EGD);  Surgeon: JMilus Banister MD;  [facility];  Service: Endoscopy;  Laterality: N/A;   ESOPHAGOGASTRODUODENOSCOPY (EGD) WITH PROPOFOL N/A 06/21/2020   Procedure: ESOPHAGOGASTRODUODENOSCOPY (EGD) WITH PROPOFOL;  Surgeon: PIrene Shipper MD;  [facility];  Service: Endoscopy;  Laterality: N/A;   ESOPHAGOGASTRODUODENOSCOPY (EGD) WITH PROPOFOL N/A 04/08/2021   Procedure: ESOPHAGOGASTRODUODENOSCOPY (EGD) WITH PROPOFOL;  Surgeon: CLavena Bullion DO;  [facility]  Service: Gastroenterology;  Laterality: N/A;   ESOPHAGOGASTRODUODENOSCOPY (EGD) WITH PROPOFOL N/A 11/06/2021   Procedure: ESOPHAGOGASTRODUODENOSCOPY (EGD) WITH PROPOFOL;  Surgeon: CLavena Bullion DO;  [facility]  Service: Gastroenterology;  Laterality: N/A;   EUS N/A 07/18/2020   Procedure: UPPER ENDOSCOPIC ULTRASOUND (EUS) RADIAL;  Surgeon: JMilus Banister MD;  [facility];  Service: Endoscopy;  Laterality: N/A;   FINE NEEDLE ASPIRATION N/A 07/18/2020   Procedure: FINE NEEDLE ASPIRATION (FNA) LINEAR;  Surgeon: JMilus Banister MD;  [facility];  Service: Endoscopy;  Laterality: N/A;   HEMOSTASIS CLIP PLACEMENT  04/08/2021   Procedure: HEMOSTASIS CLIP PLACEMENT;  Surgeon: Lavena Bullion, DO;  Location: [facility];  Service: Gastroenterology;;   hip relacement     INCISION AND DRAINAGE Right  09/11/2021   Procedure: INCISION AND DRAINAGE OF RIGHT ARM FISTULA;  Surgeon: Cherre Robins, MD;  [facility];  Service: Vascular;  Laterality: Right;   IR FLUORO GUIDE CV LINE RIGHT  03/31/2021   IR REMOVAL TUN CV CATH W/O FL  08/15/2021   IR US GUIDE VASC ACCESS RIGHT  03/31/2021   KIDNEY SURGERY     LEFT HEART CATH AND CORONARY ANGIOGRAPHY N/A 07/09/2020   Procedure: LEFT HEART CATH AND CORONARY ANGIOGRAPHY;  Surgeon: Leonie Man, MD;  Location: [place] CV LAB;  Service: Cardiovascular;  Laterality: N/A;   LEFT HEART CATHETERIZATION WITH CORONARY ANGIOGRAM N/A 02/23/2011   Procedure: LEFT HEART CATHETERIZATION WITH CORONARY ANGIOGRAM;  Surgeon: [name], MD;  Location[facility] CATH LAB;  Service: Cardiovascular;  Laterality: N/A;   LEFT HEART CATHETERIZATION WITH CORONARY ANGIOGRAM N/A 03/18/2011   Procedure: LEFT HEART CATHETERIZATION WITH CORONARY ANGIOGRAM;  Surgeon: [name], MD;  Location[facility] C[facility] LAB;  Service: Cardiovascular;  Laterality: N/A;   PERCUTANEOUS CORONARY STENT INTERVENTION (PCI-S) N/A 02/23/2011   Procedure: PERCUTANEOUS CORONARY STENT INTERVENTION (PCI-S);  Surgeon: [name], MD;  Location: [facility] CATH LAB;  Service: Cardiovascular;  Laterality: N/A;   TEMPORARY PACEMAKER INSERTION N/A 02/23/2011   Procedure: TEMPORARY PACEMAKER INSERTION;  Surgeon: [name], MD;  Location: [facility] CATH LAB;  Service: Cardiovascular;  Laterality: N/A;   THROMBECTOMY W/ EMBOLECTOMY Right 04/03/2021   Procedure: THROMBECTOMY ARTERIOVENOUS FISTULA;  Surgeon: Cherre Robins, MD;  Location: [place];  Service: Vascular;  Laterality: Right;    Allergies  Allergies  Allergen Reactions   Bee Venom Anaphylaxis    Has epi pen   Influenza Vaccines Other (See Comments)    "Mortally sick for 2 weeks"    History of Present Illness    Wayne Meadows  is a 87 year old male with the above mention past medical history who presents today for 43-monthfollow-up.  Mr. GAraceli Bouchehas  extensive cardiac history [facility] and BMS to RCA implanted 01/2011 and total occlusion of small diagonal. Patient was placed on anticoagulation due to known hemorrhagic renal cyst.  He was admitted 02/2011 with unstable angina and repeat LHC was performed which revealed patent proximal to mid RCA stent and no significant changes from previous cath.  He was admitted 06/2020 with complaint of midsternal chest pain.  High-sensitivity troponins were negative and chest x-ray revealed COPD with no acute findings.  Cardiology was consulted and patient underwent repeat LHC that revealed stable three-vessel disease with a fully occluded first diagonal branch.  He was admitted 09/28/2021 with tachycardia and shortness of breath.  EKG completed and patient was found to be in atrial flutter with variable AV block.  2D echo was completed 02/2021 with EF of 65-70%, hyperdynamic with mild concentric LVH and grade 1 DD with mass noted along the atrial septum and mildly dilated RA with mild to moderate AV regurgitation.  He was treated with Cardizem and discharged with no further intervention. His CHA2DS2-VASc is 5 and he is at risk for embolic event.  He was readmitted on 10/21/2021 after missing dialysis session.  He was found to have respiratory failure  with hypoxia.     Wayne Meadows presents today for 57-monthfollow-up alone.  Since last being seen in the office patient reports that he is experiencing any bouts of palpitations or tachycardia.   He was seen previously on 11/06/2021 for hematemesis and underwent EGD that showed esophagitis with no bleeding.  He was started on Protonix 40 mg twice daily and Carafate due to his ESRD.  He recently experienced a fall on 12/04/2021 [facility] and brain that were normal.  He continues to have increased fall risk and is currently not on anticoagulation.  He is compliant with his current  medication regimen and reports no adverse reactions currently.  His blood pressure today was 158/58 and was 138/62 on recheck.  [facility]  Patient denies chest pain, palpitations, dyspnea, PND, orthopnea, nausea, vomiting, dizziness, syncope, edema, weight gain, or early satiety.    Current Outpatient Medications  Medication Sig Dispense Refill   acetaminophen (TYLENOL) 325 MG tablet Take 2 tablets (650 mg total) by mouth every 6 (six) hours as needed for mild pain (or Fever >/= 101). (Patient taking differently: Take 650 mg by mouth every 6 (six) hours as needed for mild pain (fever > 101).)     Amino Acids-Protein Hydrolys (PRO-STAT SUGAR FREE PO) Take 30 mLs by mouth every evening.     antiseptic oral rinse (BIOTENE) LIQD 10 mLs by Mouth Rinse route 2 (two) times daily.     baclofen (LIORESAL) 10 MG tablet Take 10 mg by mouth daily at 6 (six) AM.     benzonatate (TESSALON) 100 MG capsule Take 1 capsule (100 mg total) by mouth 3 (three) times daily. 20 capsule 0   busPIRone (BUSPAR) 5 MG tablet Take 1 tablet (5 mg total) by mouth 3 (three) times daily. (0800 & 2000) (Patient taking differently: Take 5 mg by mouth 3 (three) times daily.) 60 tablet 0   cloNIDine (CATAPRES) 0.1 MG tablet Take 0.1 mg by mouth daily.     clopidogrel (PLAVIX) 75 MG tablet Take 75 mg by mouth daily.     Darbepoetin Alfa (ARANESP) 60 MCG/0.3ML SOSY injection Inject 0.3 mLs (60 mcg total) into the vein every Monday with hemodialysis. 4.2 mL    dextromethorphan-guaiFENesin (MUCINEX DM) 30-600 MG 12hr tablet Take 1 tablet by mouth 2 (two) times daily. 60 tablet 0   diltiazem (CARDIZEM CD) 240 MG 24 hr capsule Take 1 capsule (240 mg total) by mouth daily. 30 capsule 0   furosemide (LASIX) 40 MG tablet Take 40 mg by mouth 2 (two) times daily.     HYDROcodone-acetaminophen (NORCO) 10-325 MG tablet Take 1 tablet by mouth every 4 (four) hours as needed for severe pain or  moderate pain. 30 tablet 0   Ipratropium-Albuterol (COMBIVENT) 20-100 MCG/ACT AERS respimat Inhale 1 puff into the lungs 4 (four) times daily.     ipratropium-albuterol (DUONEB) 0.5-2.5 (3) MG/3ML SOLN Take 3 mLs by nebulization every 4 (four) hours as needed (shortness of breath, wheezing).     levothyroxine (SYNTHROID) 50 MCG tablet Take 50 mcg by mouth daily before breakfast.     lidocaine 4 % Place 1 patch onto the skin daily. To back     melatonin 5 MG TABS Take 5 mg by mouth at bedtime.     Menthol, Topical Analgesic, (BIOFREEZE) 4 % GEL Apply 1 Application topically 3 (three) times daily as  needed (pain).     metoprolol tartrate (LOPRESSOR) 25 MG tablet Take 0.5 tablets (12.5 mg total) by mouth 2 (two) times daily. 15 tablet 0   molnupiravir EUA (LAGEVRIO) 200 MG CAPS capsule Take 4 capsules by mouth 2 (two) times daily.     Multiple Vitamins-Minerals (DECUBI-VITE) CAPS Take 1 capsule by mouth daily.     naphazoline-glycerin (CLEAR EYES REDNESS) 0.012-0.25 % SOLN Place 1-2 drops into the right eye 3 (three) times daily.     neomycin-bacitracin-polymyxin (NEOSPORIN) OINT Apply 1 application topically 3 (three) times daily. To right antecubital area     Nutritional Supplements (NOVASOURCE RENAL) LIQD Take 237 mLs by mouth in the morning and at bedtime.     pantoprazole (PROTONIX) 40 MG tablet Take 1 tablet (40 mg total) by mouth 2 (two) times daily for 60 days, THEN 1 tablet (40 mg total) daily. 210 tablet 0   polyethylene glycol (MIRALAX / GLYCOLAX) 17 g packet Take 17 g by mouth daily. 14 each 0   pravastatin (PRAVACHOL) 40 MG tablet Take 40 mg by mouth daily. (2100)     predniSONE (DELTASONE) 10 MG tablet Take 10-40 mg by mouth See admin instructions. 40 mg daily for 3 days, 30 mg daily for 3 days,  20 mg daily for 3 days,  10 mg daily for 3 days.     senna (SENOKOT) 8.6 MG tablet Take 8.6 mg by mouth 2 (two) times daily.     sodium chloride (OCEAN) 0.65 % SOLN nasal spray Place 2 sprays  into both nostrils 2 (two) times daily.     tamsulosin (FLOMAX) 0.4 MG CAPS capsule Take 1 capsule (0.4 mg total) by mouth daily. 30 capsule    No current facility-administered medications for this visit.     Review of Systems  Please see the history of present illness.    (+) Chronic arthritis All other systems reviewed and are otherwise negative except as noted above.  Physical Exam    Wt Readings from Last 3 Encounters:  12/18/21 160 lb 12.8 oz (72.9 kg)  12/04/21 204 lb (92.5 kg)  11/06/21 160 lb 11.5 oz (72.9 kg)   VS: Vitals:   12/18/21 1137 12/18/21 1206  BP: (!) 158/58 138/62  Pulse: 84   SpO2: 96%   ,Body mass index is 21.81 kg/m.  Constitutional:      Appearance: Healthy appearance. Not in distress.  Neck:     Vascular: JVD normal.  Pulmonary:     Effort: Pulmonary effort is normal.     Breath sounds: No wheezing. No rales. Diminished in the bases Cardiovascular:     Normal rate. Regular rhythm. Normal S1. Normal S2.      Murmurs: There is no murmur.  Edema:    Peripheral edema absent.  Abdominal:     Palpations: Abdomen is soft non tender. There is no hepatomegaly.  Skin:    General: Skin is warm and dry.  Neurological:     General: No focal deficit present.     Mental Status: Alert and oriented to person, place and time.     Cranial Nerves: Cranial nerves are intact.  EKG/LABS/Other Studies Reviewed    ECG personally reviewed by me today -none completed today  Risk Assessment/Calculations:    CHA2DS2-VASc Score = 5   This indicates a 7.2% annual risk of stroke. The patient's score is based upon: CHF History: 0 HTN History: 0 Diabetes History: 0 Stroke History: 2 Vascular Disease History: 1 Age Score: 2  Gender Score: 0           Lab Results  Component Value Date   WBC 9.9 12/04/2021   HGB 11.2 (L) 12/04/2021   HCT 34.9 (L) 12/04/2021   MCV 98.3 12/04/2021   PLT 236 12/04/2021   Lab Results  Component Value Date   CREATININE  4.51 (H) 12/04/2021   BUN 22 12/04/2021   NA 137 12/04/2021   K 4.4 12/04/2021   CL 96 (L) 12/04/2021   CO2 28 12/04/2021   Lab Results  Component Value Date   ALT 21 11/05/2021   AST 21 11/05/2021   ALKPHOS 44 11/05/2021   BILITOT 1.1 11/05/2021   Lab Results  Component Value Date   CHOL 65 08/12/2020   HDL 27 (L) 08/12/2020   LDLCALC 23 08/12/2020   TRIG 74 08/12/2020   CHOLHDL 2.4 08/12/2020    Lab Results  Component Value Date   HGBA1C 7.3 (H) 07/09/2020    Assessment & Plan    1.  Paroxysmal atrial flutter: -Patient is currently rate controlled by auscultation today with rate of 84 bpm.  He is compliant with his current regimen of metoprolol 12.5 mg twice daily  and Cardizem 240 mg -He is currently not on anticoagulation due to increased fall risk -Patient's CHA2DS2-VASc score is 5, I will follow-up with Dr. Burt Knack regarding patient's need for anticoagulation or possible referral for watchman.  2.  Essential hypertension: -Patient's blood pressure today was elevated initially at 154/54 and was 138/62 on recheck. -Continue current antihypertensive regimen as prescribed  3.  Coronary artery disease: -CAD s/p PCI with bare-metal stent to RCA, CVA with right ICA occlusion -Today patient denies any chest pain or anginal equivalent -Continue pravastatin 40 mg, metoprolol 12.5 mg, Plavix 75 mg daily  4.  ESRD: -Patient has history of end-stage renal disease and is currently dialyzed Monday Wednesday and Friday -Current treatment plan per nephrology      Disposition: Follow-up with Sherren Mocha, MD or APP in 3 months   Medication Adjustments/Labs and Tests Ordered: Current medicines are reviewed at length with the patient today.  Concerns regarding medicines are outlined above.   Signed, Mable Fill, Marissa Nestle, NP 12/18/2021, 12:41 PM [facility]  Note:  This document was prepared using Dragon voice recognition software and may include  unintentional dictation errors.

## 2021-12-17 DIAGNOSIS — S2243XS Multiple fractures of ribs, bilateral, sequela: Secondary | ICD-10-CM | POA: Diagnosis not present

## 2021-12-17 DIAGNOSIS — N186 End stage renal disease: Secondary | ICD-10-CM | POA: Diagnosis not present

## 2021-12-17 DIAGNOSIS — J449 Chronic obstructive pulmonary disease, unspecified: Secondary | ICD-10-CM | POA: Diagnosis not present

## 2021-12-17 DIAGNOSIS — S22060S Wedge compression fracture of T7-T8 vertebra, sequela: Secondary | ICD-10-CM | POA: Diagnosis not present

## 2021-12-17 DIAGNOSIS — J9611 Chronic respiratory failure with hypoxia: Secondary | ICD-10-CM | POA: Diagnosis not present

## 2021-12-17 DIAGNOSIS — J690 Pneumonitis due to inhalation of food and vomit: Secondary | ICD-10-CM | POA: Diagnosis not present

## 2021-12-18 ENCOUNTER — Encounter: Payer: Self-pay | Admitting: Nurse Practitioner

## 2021-12-18 ENCOUNTER — Ambulatory Visit: Payer: Medicare Other | Attending: Cardiovascular Disease | Admitting: Nurse Practitioner

## 2021-12-18 VITALS — BP 138/62 | HR 84 | Ht 72.0 in | Wt 160.8 lb

## 2021-12-18 DIAGNOSIS — I2510 Atherosclerotic heart disease of native coronary artery without angina pectoris: Secondary | ICD-10-CM

## 2021-12-18 DIAGNOSIS — L89134 Pressure ulcer of right lower back, stage 4: Secondary | ICD-10-CM | POA: Diagnosis not present

## 2021-12-18 DIAGNOSIS — S22060S Wedge compression fracture of T7-T8 vertebra, sequela: Secondary | ICD-10-CM | POA: Diagnosis not present

## 2021-12-18 DIAGNOSIS — J449 Chronic obstructive pulmonary disease, unspecified: Secondary | ICD-10-CM | POA: Diagnosis not present

## 2021-12-18 DIAGNOSIS — J9611 Chronic respiratory failure with hypoxia: Secondary | ICD-10-CM | POA: Diagnosis not present

## 2021-12-18 DIAGNOSIS — N186 End stage renal disease: Secondary | ICD-10-CM | POA: Insufficient documentation

## 2021-12-18 DIAGNOSIS — I4892 Unspecified atrial flutter: Secondary | ICD-10-CM | POA: Insufficient documentation

## 2021-12-18 DIAGNOSIS — E1122 Type 2 diabetes mellitus with diabetic chronic kidney disease: Secondary | ICD-10-CM | POA: Diagnosis not present

## 2021-12-18 DIAGNOSIS — S2243XS Multiple fractures of ribs, bilateral, sequela: Secondary | ICD-10-CM | POA: Diagnosis not present

## 2021-12-18 DIAGNOSIS — J690 Pneumonitis due to inhalation of food and vomit: Secondary | ICD-10-CM | POA: Diagnosis not present

## 2021-12-18 DIAGNOSIS — I10 Essential (primary) hypertension: Secondary | ICD-10-CM | POA: Diagnosis not present

## 2021-12-18 NOTE — Patient Instructions (Signed)
Medication Instructions:  Your physician recommends that you continue on your current medications as directed. Please refer to the Current Medication list given to you today.  *If you need a refill on your cardiac medications before your next appointment, please call your pharmacy*   Follow-Up: At [facility], you and your health needs are our priority.  As part of our continuing mission to provide you with exceptional heart care, we have created designated Provider Care Teams.  These Care Teams include your primary Cardiologist (physician) and Advanced Practice Providers (APPs -  Physician Assistants and Nurse Practitioners) who all work together to provide you with the care you need, when you need it.  We recommend signing up for the patient portal called "MyChart".  Sign up information is provided on this After Visit Summary.  MyChart is used to connect with patients for Virtual Visits (Telemedicine).  Patients are able to view lab/test results, encounter notes, upcoming appointments, etc.  Non-urgent messages can be sent to your provider as well.   To learn more about what you can do with MyChart, go to [email or web address].    Your next appointment:   3 month(s)  The format for your next appointment:   In Person  Provider:   Ambrose Pancoast, NP

## 2021-12-19 DIAGNOSIS — S2243XS Multiple fractures of ribs, bilateral, sequela: Secondary | ICD-10-CM | POA: Diagnosis not present

## 2021-12-19 DIAGNOSIS — J9611 Chronic respiratory failure with hypoxia: Secondary | ICD-10-CM | POA: Diagnosis not present

## 2021-12-19 DIAGNOSIS — J449 Chronic obstructive pulmonary disease, unspecified: Secondary | ICD-10-CM | POA: Diagnosis not present

## 2021-12-19 DIAGNOSIS — J690 Pneumonitis due to inhalation of food and vomit: Secondary | ICD-10-CM | POA: Diagnosis not present

## 2021-12-19 DIAGNOSIS — N186 End stage renal disease: Secondary | ICD-10-CM | POA: Diagnosis not present

## 2021-12-19 DIAGNOSIS — S22060S Wedge compression fracture of T7-T8 vertebra, sequela: Secondary | ICD-10-CM | POA: Diagnosis not present

## 2021-12-20 DIAGNOSIS — J9611 Chronic respiratory failure with hypoxia: Secondary | ICD-10-CM | POA: Diagnosis not present

## 2021-12-20 DIAGNOSIS — S22060S Wedge compression fracture of T7-T8 vertebra, sequela: Secondary | ICD-10-CM | POA: Diagnosis not present

## 2021-12-20 DIAGNOSIS — J449 Chronic obstructive pulmonary disease, unspecified: Secondary | ICD-10-CM | POA: Diagnosis not present

## 2021-12-20 DIAGNOSIS — S2243XS Multiple fractures of ribs, bilateral, sequela: Secondary | ICD-10-CM | POA: Diagnosis not present

## 2021-12-20 DIAGNOSIS — J690 Pneumonitis due to inhalation of food and vomit: Secondary | ICD-10-CM | POA: Diagnosis not present

## 2021-12-20 DIAGNOSIS — N186 End stage renal disease: Secondary | ICD-10-CM | POA: Diagnosis not present

## 2021-12-22 DIAGNOSIS — I120 Hypertensive chronic kidney disease with stage 5 chronic kidney disease or end stage renal disease: Secondary | ICD-10-CM | POA: Diagnosis not present

## 2021-12-22 DIAGNOSIS — R918 Other nonspecific abnormal finding of lung field: Secondary | ICD-10-CM | POA: Diagnosis not present

## 2021-12-22 DIAGNOSIS — G894 Chronic pain syndrome: Secondary | ICD-10-CM | POA: Diagnosis not present

## 2021-12-22 DIAGNOSIS — I480 Paroxysmal atrial fibrillation: Secondary | ICD-10-CM | POA: Diagnosis not present

## 2021-12-22 DIAGNOSIS — J449 Chronic obstructive pulmonary disease, unspecified: Secondary | ICD-10-CM | POA: Diagnosis not present

## 2021-12-22 DIAGNOSIS — F411 Generalized anxiety disorder: Secondary | ICD-10-CM | POA: Diagnosis not present

## 2021-12-23 DIAGNOSIS — J449 Chronic obstructive pulmonary disease, unspecified: Secondary | ICD-10-CM | POA: Diagnosis not present

## 2021-12-23 DIAGNOSIS — S22060S Wedge compression fracture of T7-T8 vertebra, sequela: Secondary | ICD-10-CM | POA: Diagnosis not present

## 2021-12-23 DIAGNOSIS — L299 Pruritus, unspecified: Secondary | ICD-10-CM | POA: Diagnosis not present

## 2021-12-23 DIAGNOSIS — J9611 Chronic respiratory failure with hypoxia: Secondary | ICD-10-CM | POA: Diagnosis not present

## 2021-12-23 DIAGNOSIS — S2243XS Multiple fractures of ribs, bilateral, sequela: Secondary | ICD-10-CM | POA: Diagnosis not present

## 2021-12-23 DIAGNOSIS — N186 End stage renal disease: Secondary | ICD-10-CM | POA: Diagnosis not present

## 2021-12-23 DIAGNOSIS — J189 Pneumonia, unspecified organism: Secondary | ICD-10-CM | POA: Diagnosis not present

## 2021-12-23 DIAGNOSIS — G894 Chronic pain syndrome: Secondary | ICD-10-CM | POA: Diagnosis not present

## 2021-12-23 DIAGNOSIS — I120 Hypertensive chronic kidney disease with stage 5 chronic kidney disease or end stage renal disease: Secondary | ICD-10-CM | POA: Diagnosis not present

## 2021-12-23 DIAGNOSIS — J690 Pneumonitis due to inhalation of food and vomit: Secondary | ICD-10-CM | POA: Diagnosis not present

## 2021-12-24 ENCOUNTER — Telehealth: Payer: Self-pay

## 2021-12-24 DIAGNOSIS — R278 Other lack of coordination: Secondary | ICD-10-CM | POA: Diagnosis not present

## 2021-12-24 DIAGNOSIS — M6281 Muscle weakness (generalized): Secondary | ICD-10-CM | POA: Diagnosis not present

## 2021-12-24 DIAGNOSIS — N186 End stage renal disease: Secondary | ICD-10-CM | POA: Diagnosis not present

## 2021-12-24 DIAGNOSIS — R2681 Unsteadiness on feet: Secondary | ICD-10-CM | POA: Diagnosis not present

## 2021-12-24 DIAGNOSIS — S2243XS Multiple fractures of ribs, bilateral, sequela: Secondary | ICD-10-CM | POA: Diagnosis not present

## 2021-12-24 DIAGNOSIS — J690 Pneumonitis due to inhalation of food and vomit: Secondary | ICD-10-CM | POA: Diagnosis not present

## 2021-12-24 DIAGNOSIS — R262 Difficulty in walking, not elsewhere classified: Secondary | ICD-10-CM | POA: Diagnosis not present

## 2021-12-24 DIAGNOSIS — J449 Chronic obstructive pulmonary disease, unspecified: Secondary | ICD-10-CM | POA: Diagnosis not present

## 2021-12-24 DIAGNOSIS — S22060S Wedge compression fracture of T7-T8 vertebra, sequela: Secondary | ICD-10-CM | POA: Diagnosis not present

## 2021-12-24 DIAGNOSIS — R293 Abnormal posture: Secondary | ICD-10-CM | POA: Diagnosis not present

## 2021-12-24 DIAGNOSIS — J9611 Chronic respiratory failure with hypoxia: Secondary | ICD-10-CM | POA: Diagnosis not present

## 2021-12-24 NOTE — Telephone Encounter (Signed)
Left message to call back. Will arrange LAAO consult with Dr. Burt Knack.

## 2021-12-24 NOTE — Telephone Encounter (Signed)
-----   Message from Sherren Mocha, MD sent at 12/18/2021  3:02 PM EDT ----- Yes he sounds like an appropriate patient for Watchman I'll ask Valetta Fuller to set up a consult. Arther Abbott ----- Message ----- From: Marylu Lund., NP Sent: 12/18/2021  12:49 PM EDT To: Sherren Mocha, MD  Hi Dr. Burt Knack, Saw [name] today in clinic for 5-monthfollow-up atrial fibrillation/flutter.  He was maintaining sinus rhythm on Cardizem 240 mg.  He is currently not on anticoagulation due to high fall risk and recent ED visit for upper GI bleed.  His chads Vascor is a 5 he has a history of CVA.  Would he be a good candidate for watchman based on his high fall risk and history of GI bleed.  He is currently on Plavix 75 mg and is tolerating well without any complaints of bleeding.  Appreciate your feedback and looking forward to your recommendations.  EAmbrose Pancoast NP

## 2021-12-25 DIAGNOSIS — J690 Pneumonitis due to inhalation of food and vomit: Secondary | ICD-10-CM | POA: Diagnosis not present

## 2021-12-25 DIAGNOSIS — L89134 Pressure ulcer of right lower back, stage 4: Secondary | ICD-10-CM | POA: Diagnosis not present

## 2021-12-25 DIAGNOSIS — N186 End stage renal disease: Secondary | ICD-10-CM | POA: Diagnosis not present

## 2021-12-25 DIAGNOSIS — S22060S Wedge compression fracture of T7-T8 vertebra, sequela: Secondary | ICD-10-CM | POA: Diagnosis not present

## 2021-12-25 DIAGNOSIS — E1122 Type 2 diabetes mellitus with diabetic chronic kidney disease: Secondary | ICD-10-CM | POA: Diagnosis not present

## 2021-12-25 DIAGNOSIS — J449 Chronic obstructive pulmonary disease, unspecified: Secondary | ICD-10-CM | POA: Diagnosis not present

## 2021-12-25 DIAGNOSIS — S2243XS Multiple fractures of ribs, bilateral, sequela: Secondary | ICD-10-CM | POA: Diagnosis not present

## 2021-12-25 DIAGNOSIS — J9611 Chronic respiratory failure with hypoxia: Secondary | ICD-10-CM | POA: Diagnosis not present

## 2021-12-25 NOTE — Telephone Encounter (Signed)
Left message to call back  

## 2021-12-26 DIAGNOSIS — J449 Chronic obstructive pulmonary disease, unspecified: Secondary | ICD-10-CM | POA: Diagnosis not present

## 2021-12-26 DIAGNOSIS — J690 Pneumonitis due to inhalation of food and vomit: Secondary | ICD-10-CM | POA: Diagnosis not present

## 2021-12-26 DIAGNOSIS — S2243XS Multiple fractures of ribs, bilateral, sequela: Secondary | ICD-10-CM | POA: Diagnosis not present

## 2021-12-26 DIAGNOSIS — N186 End stage renal disease: Secondary | ICD-10-CM | POA: Diagnosis not present

## 2021-12-26 DIAGNOSIS — S22060S Wedge compression fracture of T7-T8 vertebra, sequela: Secondary | ICD-10-CM | POA: Diagnosis not present

## 2021-12-26 DIAGNOSIS — J9611 Chronic respiratory failure with hypoxia: Secondary | ICD-10-CM | POA: Diagnosis not present

## 2021-12-28 DIAGNOSIS — J9611 Chronic respiratory failure with hypoxia: Secondary | ICD-10-CM | POA: Diagnosis not present

## 2021-12-28 DIAGNOSIS — S2243XS Multiple fractures of ribs, bilateral, sequela: Secondary | ICD-10-CM | POA: Diagnosis not present

## 2021-12-28 DIAGNOSIS — J449 Chronic obstructive pulmonary disease, unspecified: Secondary | ICD-10-CM | POA: Diagnosis not present

## 2021-12-28 DIAGNOSIS — N186 End stage renal disease: Secondary | ICD-10-CM | POA: Diagnosis not present

## 2021-12-28 DIAGNOSIS — J690 Pneumonitis due to inhalation of food and vomit: Secondary | ICD-10-CM | POA: Diagnosis not present

## 2021-12-28 DIAGNOSIS — S22060S Wedge compression fracture of T7-T8 vertebra, sequela: Secondary | ICD-10-CM | POA: Diagnosis not present

## 2021-12-29 NOTE — Telephone Encounter (Signed)
Left message to call back  

## 2021-12-30 DIAGNOSIS — J449 Chronic obstructive pulmonary disease, unspecified: Secondary | ICD-10-CM | POA: Diagnosis not present

## 2021-12-30 DIAGNOSIS — S22060S Wedge compression fracture of T7-T8 vertebra, sequela: Secondary | ICD-10-CM | POA: Diagnosis not present

## 2021-12-30 DIAGNOSIS — I480 Paroxysmal atrial fibrillation: Secondary | ICD-10-CM | POA: Diagnosis not present

## 2021-12-30 DIAGNOSIS — M6281 Muscle weakness (generalized): Secondary | ICD-10-CM | POA: Diagnosis not present

## 2021-12-30 DIAGNOSIS — G894 Chronic pain syndrome: Secondary | ICD-10-CM | POA: Diagnosis not present

## 2021-12-30 DIAGNOSIS — N186 End stage renal disease: Secondary | ICD-10-CM | POA: Diagnosis not present

## 2021-12-30 DIAGNOSIS — F411 Generalized anxiety disorder: Secondary | ICD-10-CM | POA: Diagnosis not present

## 2021-12-30 DIAGNOSIS — J9611 Chronic respiratory failure with hypoxia: Secondary | ICD-10-CM | POA: Diagnosis not present

## 2021-12-30 DIAGNOSIS — S2243XS Multiple fractures of ribs, bilateral, sequela: Secondary | ICD-10-CM | POA: Diagnosis not present

## 2021-12-30 DIAGNOSIS — J690 Pneumonitis due to inhalation of food and vomit: Secondary | ICD-10-CM | POA: Diagnosis not present

## 2021-12-31 ENCOUNTER — Other Ambulatory Visit: Payer: Self-pay

## 2021-12-31 ENCOUNTER — Encounter (HOSPITAL_COMMUNITY): Payer: Self-pay

## 2021-12-31 ENCOUNTER — Emergency Department (HOSPITAL_COMMUNITY)
Admission: EM | Admit: 2021-12-31 | Discharge: 2022-01-01 | Disposition: A | Discharge status: Skilled Nursing Facility | Payer: No Typology Code available for payment source | Attending: Emergency Medicine | Admitting: Emergency Medicine

## 2021-12-31 ENCOUNTER — Emergency Department (HOSPITAL_COMMUNITY): Payer: No Typology Code available for payment source

## 2021-12-31 DIAGNOSIS — F17200 Nicotine dependence, unspecified, uncomplicated: Secondary | ICD-10-CM | POA: Insufficient documentation

## 2021-12-31 DIAGNOSIS — N186 End stage renal disease: Secondary | ICD-10-CM | POA: Diagnosis not present

## 2021-12-31 DIAGNOSIS — M25511 Pain in right shoulder: Secondary | ICD-10-CM | POA: Insufficient documentation

## 2021-12-31 DIAGNOSIS — I10 Essential (primary) hypertension: Secondary | ICD-10-CM | POA: Diagnosis not present

## 2021-12-31 DIAGNOSIS — Z79899 Other long term (current) drug therapy: Secondary | ICD-10-CM | POA: Diagnosis not present

## 2021-12-31 DIAGNOSIS — J449 Chronic obstructive pulmonary disease, unspecified: Secondary | ICD-10-CM | POA: Insufficient documentation

## 2021-12-31 DIAGNOSIS — I2510 Atherosclerotic heart disease of native coronary artery without angina pectoris: Secondary | ICD-10-CM | POA: Insufficient documentation

## 2021-12-31 DIAGNOSIS — R0789 Other chest pain: Secondary | ICD-10-CM | POA: Diagnosis not present

## 2021-12-31 DIAGNOSIS — I120 Hypertensive chronic kidney disease with stage 5 chronic kidney disease or end stage renal disease: Secondary | ICD-10-CM | POA: Diagnosis not present

## 2021-12-31 DIAGNOSIS — Z85528 Personal history of other malignant neoplasm of kidney: Secondary | ICD-10-CM | POA: Diagnosis not present

## 2021-12-31 DIAGNOSIS — Z7902 Long term (current) use of antithrombotics/antiplatelets: Secondary | ICD-10-CM | POA: Diagnosis not present

## 2021-12-31 DIAGNOSIS — J690 Pneumonitis due to inhalation of food and vomit: Secondary | ICD-10-CM | POA: Diagnosis not present

## 2021-12-31 DIAGNOSIS — R0902 Hypoxemia: Secondary | ICD-10-CM | POA: Diagnosis not present

## 2021-12-31 DIAGNOSIS — Z992 Dependence on renal dialysis: Secondary | ICD-10-CM | POA: Insufficient documentation

## 2021-12-31 DIAGNOSIS — S2243XS Multiple fractures of ribs, bilateral, sequela: Secondary | ICD-10-CM | POA: Diagnosis not present

## 2021-12-31 DIAGNOSIS — S22060S Wedge compression fracture of T7-T8 vertebra, sequela: Secondary | ICD-10-CM | POA: Diagnosis not present

## 2021-12-31 DIAGNOSIS — R079 Chest pain, unspecified: Secondary | ICD-10-CM | POA: Diagnosis not present

## 2021-12-31 DIAGNOSIS — J9611 Chronic respiratory failure with hypoxia: Secondary | ICD-10-CM | POA: Diagnosis not present

## 2021-12-31 LAB — CBC
HCT: 34.5 % — ABNORMAL LOW (ref 39.0–52.0)
Hemoglobin: 11.2 g/dL — ABNORMAL LOW (ref 13.0–17.0)
MCH: 30.6 pg (ref 26.0–34.0)
MCHC: 32.5 g/dL (ref 30.0–36.0)
MCV: 94.3 fL (ref 80.0–100.0)
Platelets: 255 10*3/uL (ref 150–400)
RBC: 3.66 MIL/uL — ABNORMAL LOW (ref 4.22–5.81)
RDW: 15.3 % (ref 11.5–15.5)
WBC: 15.1 10*3/uL — ABNORMAL HIGH (ref 4.0–10.5)
nRBC: 0 % (ref 0.0–0.2)

## 2021-12-31 LAB — BASIC METABOLIC PANEL
Anion gap: 13 (ref 5–15)
BUN: 26 mg/dL — ABNORMAL HIGH (ref 8–23)
CO2: 29 mmol/L (ref 22–32)
Calcium: 8.8 mg/dL — ABNORMAL LOW (ref 8.9–10.3)
Chloride: 99 mmol/L (ref 98–111)
Creatinine, Ser: 3.89 mg/dL — ABNORMAL HIGH (ref 0.61–1.24)
GFR, Estimated: 15 mL/min — ABNORMAL LOW (ref >60)
Glucose, Bld: 80 mg/dL (ref 70–99)
Potassium: 4.8 mmol/L (ref 3.5–5.1)
Sodium: 141 mmol/L (ref 135–145)

## 2021-12-31 LAB — TROPONIN I (HIGH SENSITIVITY): Troponin I (High Sensitivity): 18 ng/L — ABNORMAL HIGH (ref <18)

## 2021-12-31 NOTE — ED Provider Triage Note (Addendum)
Emergency Medicine Provider Triage Evaluation Note  Wayne Meadows , a 87 y.o. male  was evaluated in triage.  Pt complains of sudden onset had an episode that lasted 10 seconds, which resolved after. He took nitroglycerin with resolution in symptoms.  Patient does wear oxygen at baseline, on 2 to 3 L via nasal cannula without any increase.  Review of Systems  Positive: Sob, cp  Negative: Fever, cough  Physical Exam  There were no vitals taken for this visit. Gen:   Awake, no distress   Resp:  Normal effort  MSK:   Moves extremities without difficulty  Other:  Lungs are somewhat diminished to auscultation.No pitting edema or calf tenderness BL.   Medical Decision Making  Medically screening exam initiated at 5:45 PM.  Appropriate orders placed.  Wayne Meadows was informed that the remainder of the evaluation will be completed by another provider, this initial triage assessment does not replace that evaluation, and the importance of remaining in the ED until their evaluation is complete.     Wayne Fitting, PA-C 12/31/21 [place], Wayne Bucks, PA-C 12/31/21 1751

## 2021-12-31 NOTE — ED Notes (Signed)
Oxygen tank replaced at 3L

## 2021-12-31 NOTE — ED Triage Notes (Addendum)
Coming from dialysis after having a 10sec episode of chest pain.  Pain on inspiration.  Non radiating.  Gave nitro and asa.  Dialysis MWF Patient on 4L [place] at all times.  Patient denies pain at this time.

## 2022-01-01 DIAGNOSIS — I2510 Atherosclerotic heart disease of native coronary artery without angina pectoris: Secondary | ICD-10-CM | POA: Diagnosis not present

## 2022-01-01 DIAGNOSIS — I480 Paroxysmal atrial fibrillation: Secondary | ICD-10-CM | POA: Diagnosis not present

## 2022-01-01 DIAGNOSIS — L89134 Pressure ulcer of right lower back, stage 4: Secondary | ICD-10-CM | POA: Diagnosis not present

## 2022-01-01 DIAGNOSIS — N186 End stage renal disease: Secondary | ICD-10-CM | POA: Diagnosis not present

## 2022-01-01 DIAGNOSIS — G894 Chronic pain syndrome: Secondary | ICD-10-CM | POA: Diagnosis not present

## 2022-01-01 DIAGNOSIS — I120 Hypertensive chronic kidney disease with stage 5 chronic kidney disease or end stage renal disease: Secondary | ICD-10-CM | POA: Diagnosis not present

## 2022-01-01 DIAGNOSIS — E1122 Type 2 diabetes mellitus with diabetic chronic kidney disease: Secondary | ICD-10-CM | POA: Diagnosis not present

## 2022-01-01 DIAGNOSIS — J449 Chronic obstructive pulmonary disease, unspecified: Secondary | ICD-10-CM | POA: Diagnosis not present

## 2022-01-01 DIAGNOSIS — F411 Generalized anxiety disorder: Secondary | ICD-10-CM | POA: Diagnosis not present

## 2022-01-01 LAB — TROPONIN I (HIGH SENSITIVITY): Troponin I (High Sensitivity): 19 ng/L — ABNORMAL HIGH (ref <18)

## 2022-01-01 NOTE — ED Provider Notes (Signed)
[facility] EMERGENCY DEPARTMENT Provider Note   CSN: [id number] Arrival date & time: 12/31/21  1741     History  Chief Complaint  Patient presents with   Chest Pain    Wayne Meadows is a 87 y.o. male.   Chest Pain Patient presents after chest pain.  Has been initially screened last night but has been waiting around 17 hours.  Was at dialysis and had 15 seconds of right shoulder pain.  May have gone to the chest.  Pain-free after that.  Previous MI but states this felt different.  Patient states he should not of, just wants to go home.  He is on chronic oxygen.    Past Medical History:  Diagnosis Date   Abnormal echocardiogram 03/08/2021   Abnormal finding on GI tract imaging    Acute blood loss anemia    Acute pancreatitis 08/12/2020   Arteriovenous fistula infection ([place]) 04/18/2021   Arthritis    Carotid stenosis    Community acquired pneumonia 04/06/2021   COPD (chronic obstructive pulmonary disease) ([place])    Coronary artery disease    S/p PCI 2011;  NSTEMI 12/12:  LHC/PCI 02/23/11: LAD 60% after the septal perforator, D1 occluded with distal collaterals, proximal RI 30-40%, AV circumflex stent patent with 60% stenosis after the stent, RCA 99%, EF 60-65%.  His RCA was treated with a bare-metal stent   CVA (cerebral infarction) 2011   Right cerebral; total obstruction of the right ICA   Diverticulitis    Hypertension    Paroxysmal atrial fibrillation with RVR ([place]) 04/09/2021   Partial small bowel obstruction ([place]) 08/17/2021   Pleuritic chest pain 04/08/2014   Pressure injury of skin 03/30/2021   Rash 03/05/2021   Rectal bleeding 07/2015   Refractory nausea and vomiting 06/20/2020   Renal carcinoma (HCC)    Sepsis, unspecified organism ([place]) 04/11/2016   Stroke ([place])    Tobacco abuse, in remission    Unstable angina ([place]) 03/18/2011    Home Medications Prior to Admission medications   Medication Sig Start Date End Date Taking? Authorizing Provider   acetaminophen (TYLENOL) 325 MG tablet Take 2 tablets (650 mg total) by mouth every 6 (six) hours as needed for mild pain (or Fever >/= 101). Patient taking differently: Take 650 mg by mouth every 6 (six) hours as needed for mild pain (fever > 101). 04/10/21   Bonnielee Haff, MD  Amino Acids-Protein Hydrolys (PRO-STAT SUGAR FREE PO) Take 30 mLs by mouth every evening.    [provider]  antiseptic oral rinse (BIOTENE) LIQD 10 mLs by Mouth Rinse route 2 (two) times daily.    [provider]  baclofen (LIORESAL) 10 MG tablet Take 10 mg by mouth daily at 6 (six) AM.    [provider]  benzonatate (TESSALON) 100 MG capsule Take 1 capsule (100 mg total) by mouth 3 (three) times daily. 10/30/21   Lavina Hamman, MD  busPIRone (BUSPAR) 5 MG tablet Take 1 tablet (5 mg total) by mouth 3 (three) times daily. (0800 & 2000) Patient taking differently: Take 5 mg by mouth 3 (three) times daily. 10/30/21   Lavina Hamman, MD  cloNIDine (CATAPRES) 0.1 MG tablet Take 0.1 mg by mouth daily.    [provider]  clopidogrel (PLAVIX) 75 MG tablet Take 75 mg by mouth daily.    [provider]  Darbepoetin Alfa (ARANESP) 60 MCG/0.3ML SOSY injection Inject 0.3 mLs (60 mcg total) into the vein every Monday with hemodialysis.  09/30/21   Cherene Altes, MD  dextromethorphan-guaiFENesin (MUCINEX DM) 30-600 MG 12hr tablet Take 1 tablet by mouth 2 (two) times daily. 10/30/21   Lavina Hamman, MD  diltiazem (CARDIZEM CD) 240 MG 24 hr capsule Take 1 capsule (240 mg total) by mouth daily. 10/30/21   Lavina Hamman, MD  furosemide (LASIX) 40 MG tablet Take 40 mg by mouth 2 (two) times daily.    [provider]  HYDROcodone-acetaminophen (NORCO) 10-325 MG tablet Take 1 tablet by mouth every 4 (four) hours as needed for severe pain or moderate pain. 10/30/21   Lavina Hamman, MD  Ipratropium-Albuterol (COMBIVENT) 20-100 MCG/ACT AERS respimat Inhale 1 puff into the lungs 4 (four)  times daily.    [provider]  ipratropium-albuterol (DUONEB) 0.5-2.5 (3) MG/3ML SOLN Take 3 mLs by nebulization every 4 (four) hours as needed (shortness of breath, wheezing).    [provider]  levothyroxine (SYNTHROID) 50 MCG tablet Take 50 mcg by mouth daily before breakfast.    [provider]  lidocaine 4 % Place 1 patch onto the skin daily. To back    [provider]  melatonin 5 MG TABS Take 5 mg by mouth at bedtime. 09/30/21   [provider]  Menthol, Topical Analgesic, (BIOFREEZE) 4 % GEL Apply 1 Application topically 3 (three) times daily as needed (pain).    [provider]  metoprolol tartrate (LOPRESSOR) 25 MG tablet Take 0.5 tablets (12.5 mg total) by mouth 2 (two) times daily. 10/30/21   Lavina Hamman, MD  molnupiravir EUA (LAGEVRIO) 200 MG CAPS capsule Take 4 capsules by mouth 2 (two) times daily.    [provider]  Multiple Vitamins-Minerals (DECUBI-VITE) CAPS Take 1 capsule by mouth daily.    [provider]  naphazoline-glycerin (CLEAR EYES REDNESS) 0.012-0.25 % SOLN Place 1-2 drops into the right eye 3 (three) times daily. 04/23/21   Alma Friendly, MD  neomycin-bacitracin-polymyxin (NEOSPORIN) OINT Apply 1 application topically 3 (three) times daily. To right antecubital area 04/10/21   Bonnielee Haff, MD  Nutritional Supplements (NOVASOURCE RENAL) LIQD Take 237 mLs by mouth in the morning and at bedtime.    [provider]  pantoprazole (PROTONIX) 40 MG tablet Take 1 tablet (40 mg total) by mouth 2 (two) times daily for 60 days, THEN 1 tablet (40 mg total) daily. 11/06/21 04/05/22  Mercy Riding, MD  polyethylene glycol (MIRALAX / GLYCOLAX) 17 g packet Take 17 g by mouth daily. 05/01/21   Sherwood Gambler, MD  pravastatin (PRAVACHOL) 40 MG tablet Take 40 mg by mouth daily. (2100)    [provider]  predniSONE (DELTASONE) 10 MG tablet Take 10-40 mg by mouth See admin instructions. 40 mg  daily for 3 days, 30 mg daily for 3 days,  20 mg daily for 3 days,  10 mg daily for 3 days.    [provider]  senna (SENOKOT) 8.6 MG tablet Take 8.6 mg by mouth 2 (two) times daily. 10/15/21   [provider]  sodium chloride (OCEAN) 0.65 % SOLN nasal spray Place 2 sprays into both nostrils 2 (two) times daily.    [provider]  tamsulosin (FLOMAX) 0.4 MG CAPS capsule Take 1 capsule (0.4 mg total) by mouth daily. 04/11/21   Bonnielee Haff, MD      Allergies    Bee venom and Influenza vaccines    Review of Systems   Review of Systems  Cardiovascular:  Positive for chest pain.  Physical Exam Updated Vital Signs BP (!) 154/63 (BP Location: Left Arm)   Pulse 95   Temp 98.6 F (37 C) (Oral)   Resp 17   Ht 6' (1.829 m)   Wt 72.6 kg   SpO2 97%   BMI 21.70 kg/m  Physical Exam Vitals and nursing note reviewed.  Cardiovascular:     Rate and Rhythm: Normal rate.  Pulmonary:     Breath sounds: No decreased breath sounds or wheezing.     Comments: Mildly harsh breath sounds. Chest:     Chest wall: No tenderness.  Neurological:     Mental Status: He is alert.     ED Results / Procedures / Treatments   Labs (all labs ordered are listed, but only abnormal results are displayed) Labs Reviewed  BASIC METABOLIC PANEL - Abnormal; Notable for the following components:      Result Value   BUN 26 (*)    Creatinine, Ser 3.89 (*)    Calcium 8.8 (*)    GFR, Estimated 15 (*)    All other components within normal limits  CBC - Abnormal; Notable for the following components:   WBC 15.1 (*)    RBC 3.66 (*)    Hemoglobin 11.2 (*)    HCT 34.5 (*)    All other components within normal limits  TROPONIN I (HIGH SENSITIVITY) - Abnormal; Notable for the following components:   Troponin I (High Sensitivity) 18 (*)    All other components within normal limits  TROPONIN I (HIGH SENSITIVITY) - Abnormal; Notable for the following components:   Troponin I (High  Sensitivity) 19 (*)    All other components within normal limits    EKG EKG Interpretation  Date/Time:  Wednesday December 31 2021 17:57:03 EST Ventricular Rate:  98 PR Interval:  158 QRS Duration: 98 QT Interval:  350 QTC Calculation: 446 R Axis:   88 Text Interpretation: Normal sinus rhythm Cannot rule out Anterior infarct , age undetermined Abnormal ECG When compared with ECG of 05-Nov-2021 17:50, No significant change since last tracing Confirmed by Davonna Belling [phone]) on 01/01/2022 10:40:57 AM  Radiology DG Chest 1 View  Result Date: 12/31/2021 CLINICAL DATA:  Chest pain EXAM: CHEST  1 VIEW COMPARISON:  12/04/2021 FINDINGS: Transverse diameter of heart is increased. Small to moderate bilateral pleural effusions are seen, more so on the left side. Low position of diaphragms suggests COPD. Pulmonary vascularity is unremarkable. There are no signs of alveolar pulmonary edema in the parahilar regions. There is no pneumothorax. There is a linear metallic density overlying the right lower lung field. This may be an artifact outside the patient's body. There is evidence of surgical fusion in lumbar spine. IMPRESSION: Bilateral pleural effusions with interval increase. COPD. There are no signs of alveolar pulmonary edema or focal consolidation in the visualized lung fields. Evaluation of left lower lung fields for infiltrates is limited by superimposed effusion. Electronically Signed   By: Elmer Picker M.D.   On: 12/31/2021 18:39    Procedures Procedures    Medications Ordered in ED Medications - No data to display  ED Course/ Medical Decision Making/ A&P                           Medical Decision Making  Patient with chest pain.  Reportedly was in the shoulder.  Pain-free now.  Lasted 15 seconds at dialysis around per patient 19 hours ago.  EKG reassuring.  Troponin just  slightly above normal but stable.  Has been at this level before.  Patient is eager to leave.  Chest  x-ray reassuring but does have worsening effusions..  Doubt ischemia does not want to stay in the longer will discharge home.        Final Clinical Impression(s) / ED Diagnoses Final diagnoses:  Acute pain of right shoulder  End stage renal disease on dialysis [facility])    Rx / DC Orders ED Discharge Orders     None         Davonna Belling, MD 01/01/22 1041

## 2022-01-01 NOTE — ED Notes (Signed)
[facility] has been called for pt 

## 2022-01-01 NOTE — ED Notes (Signed)
[facility] here for pt now

## 2022-01-01 NOTE — ED Notes (Signed)
Oxygen tank replaced. Patient placed in recliner for comfort and warm blankets given.

## 2022-01-01 NOTE — ED Notes (Signed)
Oxygen tank replaced °

## 2022-01-02 DIAGNOSIS — S22060S Wedge compression fracture of T7-T8 vertebra, sequela: Secondary | ICD-10-CM | POA: Diagnosis not present

## 2022-01-02 DIAGNOSIS — J9611 Chronic respiratory failure with hypoxia: Secondary | ICD-10-CM | POA: Diagnosis not present

## 2022-01-02 DIAGNOSIS — J690 Pneumonitis due to inhalation of food and vomit: Secondary | ICD-10-CM | POA: Diagnosis not present

## 2022-01-02 DIAGNOSIS — S2243XS Multiple fractures of ribs, bilateral, sequela: Secondary | ICD-10-CM | POA: Diagnosis not present

## 2022-01-02 DIAGNOSIS — N186 End stage renal disease: Secondary | ICD-10-CM | POA: Diagnosis not present

## 2022-01-02 DIAGNOSIS — J449 Chronic obstructive pulmonary disease, unspecified: Secondary | ICD-10-CM | POA: Diagnosis not present

## 2022-01-03 DIAGNOSIS — G894 Chronic pain syndrome: Secondary | ICD-10-CM | POA: Diagnosis not present

## 2022-01-03 DIAGNOSIS — F419 Anxiety disorder, unspecified: Secondary | ICD-10-CM | POA: Diagnosis not present

## 2022-01-03 DIAGNOSIS — I120 Hypertensive chronic kidney disease with stage 5 chronic kidney disease or end stage renal disease: Secondary | ICD-10-CM | POA: Diagnosis not present

## 2022-01-03 DIAGNOSIS — F411 Generalized anxiety disorder: Secondary | ICD-10-CM | POA: Diagnosis not present

## 2022-01-03 DIAGNOSIS — K259 Gastric ulcer, unspecified as acute or chronic, without hemorrhage or perforation: Secondary | ICD-10-CM | POA: Diagnosis not present

## 2022-01-03 DIAGNOSIS — I2510 Atherosclerotic heart disease of native coronary artery without angina pectoris: Secondary | ICD-10-CM | POA: Diagnosis not present

## 2022-01-03 DIAGNOSIS — N186 End stage renal disease: Secondary | ICD-10-CM | POA: Diagnosis not present

## 2022-01-03 DIAGNOSIS — M6281 Muscle weakness (generalized): Secondary | ICD-10-CM | POA: Diagnosis not present

## 2022-01-03 DIAGNOSIS — I480 Paroxysmal atrial fibrillation: Secondary | ICD-10-CM | POA: Diagnosis not present

## 2022-01-04 DIAGNOSIS — J449 Chronic obstructive pulmonary disease, unspecified: Secondary | ICD-10-CM | POA: Diagnosis not present

## 2022-01-04 DIAGNOSIS — S22060S Wedge compression fracture of T7-T8 vertebra, sequela: Secondary | ICD-10-CM | POA: Diagnosis not present

## 2022-01-04 DIAGNOSIS — J9611 Chronic respiratory failure with hypoxia: Secondary | ICD-10-CM | POA: Diagnosis not present

## 2022-01-04 DIAGNOSIS — S2243XS Multiple fractures of ribs, bilateral, sequela: Secondary | ICD-10-CM | POA: Diagnosis not present

## 2022-01-04 DIAGNOSIS — J690 Pneumonitis due to inhalation of food and vomit: Secondary | ICD-10-CM | POA: Diagnosis not present

## 2022-01-04 DIAGNOSIS — N186 End stage renal disease: Secondary | ICD-10-CM | POA: Diagnosis not present

## 2022-01-05 DIAGNOSIS — J449 Chronic obstructive pulmonary disease, unspecified: Secondary | ICD-10-CM | POA: Diagnosis not present

## 2022-01-05 DIAGNOSIS — N186 End stage renal disease: Secondary | ICD-10-CM | POA: Diagnosis not present

## 2022-01-05 DIAGNOSIS — S2243XS Multiple fractures of ribs, bilateral, sequela: Secondary | ICD-10-CM | POA: Diagnosis not present

## 2022-01-05 DIAGNOSIS — J690 Pneumonitis due to inhalation of food and vomit: Secondary | ICD-10-CM | POA: Diagnosis not present

## 2022-01-05 DIAGNOSIS — S22060S Wedge compression fracture of T7-T8 vertebra, sequela: Secondary | ICD-10-CM | POA: Diagnosis not present

## 2022-01-05 DIAGNOSIS — J9611 Chronic respiratory failure with hypoxia: Secondary | ICD-10-CM | POA: Diagnosis not present

## 2022-01-05 NOTE — Telephone Encounter (Signed)
Left message to call back to arrange Watchman consult.  After several attempts to reach the patient, letter mailed with information to call back.

## 2022-01-06 DIAGNOSIS — J9611 Chronic respiratory failure with hypoxia: Secondary | ICD-10-CM | POA: Diagnosis not present

## 2022-01-06 DIAGNOSIS — S2243XS Multiple fractures of ribs, bilateral, sequela: Secondary | ICD-10-CM | POA: Diagnosis not present

## 2022-01-06 DIAGNOSIS — J449 Chronic obstructive pulmonary disease, unspecified: Secondary | ICD-10-CM | POA: Diagnosis not present

## 2022-01-06 DIAGNOSIS — J690 Pneumonitis due to inhalation of food and vomit: Secondary | ICD-10-CM | POA: Diagnosis not present

## 2022-01-06 DIAGNOSIS — N186 End stage renal disease: Secondary | ICD-10-CM | POA: Diagnosis not present

## 2022-01-06 DIAGNOSIS — S22060S Wedge compression fracture of T7-T8 vertebra, sequela: Secondary | ICD-10-CM | POA: Diagnosis not present

## 2022-01-07 DIAGNOSIS — S2243XS Multiple fractures of ribs, bilateral, sequela: Secondary | ICD-10-CM | POA: Diagnosis not present

## 2022-01-07 DIAGNOSIS — J449 Chronic obstructive pulmonary disease, unspecified: Secondary | ICD-10-CM | POA: Diagnosis not present

## 2022-01-07 DIAGNOSIS — I120 Hypertensive chronic kidney disease with stage 5 chronic kidney disease or end stage renal disease: Secondary | ICD-10-CM | POA: Diagnosis not present

## 2022-01-07 DIAGNOSIS — L299 Pruritus, unspecified: Secondary | ICD-10-CM | POA: Diagnosis not present

## 2022-01-07 DIAGNOSIS — J690 Pneumonitis due to inhalation of food and vomit: Secondary | ICD-10-CM | POA: Diagnosis not present

## 2022-01-07 DIAGNOSIS — F411 Generalized anxiety disorder: Secondary | ICD-10-CM | POA: Diagnosis not present

## 2022-01-07 DIAGNOSIS — G894 Chronic pain syndrome: Secondary | ICD-10-CM | POA: Diagnosis not present

## 2022-01-07 DIAGNOSIS — S22060S Wedge compression fracture of T7-T8 vertebra, sequela: Secondary | ICD-10-CM | POA: Diagnosis not present

## 2022-01-07 DIAGNOSIS — N186 End stage renal disease: Secondary | ICD-10-CM | POA: Diagnosis not present

## 2022-01-07 DIAGNOSIS — J9611 Chronic respiratory failure with hypoxia: Secondary | ICD-10-CM | POA: Diagnosis not present

## 2022-01-08 DIAGNOSIS — E1122 Type 2 diabetes mellitus with diabetic chronic kidney disease: Secondary | ICD-10-CM | POA: Diagnosis not present

## 2022-01-08 DIAGNOSIS — S2243XS Multiple fractures of ribs, bilateral, sequela: Secondary | ICD-10-CM | POA: Diagnosis not present

## 2022-01-08 DIAGNOSIS — J9611 Chronic respiratory failure with hypoxia: Secondary | ICD-10-CM | POA: Diagnosis not present

## 2022-01-08 DIAGNOSIS — S22060S Wedge compression fracture of T7-T8 vertebra, sequela: Secondary | ICD-10-CM | POA: Diagnosis not present

## 2022-01-08 DIAGNOSIS — N186 End stage renal disease: Secondary | ICD-10-CM | POA: Diagnosis not present

## 2022-01-08 DIAGNOSIS — J449 Chronic obstructive pulmonary disease, unspecified: Secondary | ICD-10-CM | POA: Diagnosis not present

## 2022-01-08 DIAGNOSIS — L89134 Pressure ulcer of right lower back, stage 4: Secondary | ICD-10-CM | POA: Diagnosis not present

## 2022-01-08 DIAGNOSIS — J690 Pneumonitis due to inhalation of food and vomit: Secondary | ICD-10-CM | POA: Diagnosis not present

## 2022-01-09 DIAGNOSIS — J449 Chronic obstructive pulmonary disease, unspecified: Secondary | ICD-10-CM | POA: Diagnosis not present

## 2022-01-09 DIAGNOSIS — S2243XS Multiple fractures of ribs, bilateral, sequela: Secondary | ICD-10-CM | POA: Diagnosis not present

## 2022-01-09 DIAGNOSIS — J9611 Chronic respiratory failure with hypoxia: Secondary | ICD-10-CM | POA: Diagnosis not present

## 2022-01-09 DIAGNOSIS — S22060S Wedge compression fracture of T7-T8 vertebra, sequela: Secondary | ICD-10-CM | POA: Diagnosis not present

## 2022-01-09 DIAGNOSIS — N186 End stage renal disease: Secondary | ICD-10-CM | POA: Diagnosis not present

## 2022-01-09 DIAGNOSIS — J690 Pneumonitis due to inhalation of food and vomit: Secondary | ICD-10-CM | POA: Diagnosis not present

## 2022-01-12 DIAGNOSIS — S22060S Wedge compression fracture of T7-T8 vertebra, sequela: Secondary | ICD-10-CM | POA: Diagnosis not present

## 2022-01-12 DIAGNOSIS — J9611 Chronic respiratory failure with hypoxia: Secondary | ICD-10-CM | POA: Diagnosis not present

## 2022-01-12 DIAGNOSIS — J690 Pneumonitis due to inhalation of food and vomit: Secondary | ICD-10-CM | POA: Diagnosis not present

## 2022-01-12 DIAGNOSIS — J449 Chronic obstructive pulmonary disease, unspecified: Secondary | ICD-10-CM | POA: Diagnosis not present

## 2022-01-12 DIAGNOSIS — N186 End stage renal disease: Secondary | ICD-10-CM | POA: Diagnosis not present

## 2022-01-12 DIAGNOSIS — S2243XS Multiple fractures of ribs, bilateral, sequela: Secondary | ICD-10-CM | POA: Diagnosis not present

## 2022-01-13 DIAGNOSIS — J690 Pneumonitis due to inhalation of food and vomit: Secondary | ICD-10-CM | POA: Diagnosis not present

## 2022-01-13 DIAGNOSIS — S22060S Wedge compression fracture of T7-T8 vertebra, sequela: Secondary | ICD-10-CM | POA: Diagnosis not present

## 2022-01-13 DIAGNOSIS — J9611 Chronic respiratory failure with hypoxia: Secondary | ICD-10-CM | POA: Diagnosis not present

## 2022-01-13 DIAGNOSIS — S2243XS Multiple fractures of ribs, bilateral, sequela: Secondary | ICD-10-CM | POA: Diagnosis not present

## 2022-01-13 DIAGNOSIS — N186 End stage renal disease: Secondary | ICD-10-CM | POA: Diagnosis not present

## 2022-01-13 DIAGNOSIS — J449 Chronic obstructive pulmonary disease, unspecified: Secondary | ICD-10-CM | POA: Diagnosis not present

## 2022-01-14 DIAGNOSIS — S22060S Wedge compression fracture of T7-T8 vertebra, sequela: Secondary | ICD-10-CM | POA: Diagnosis not present

## 2022-01-14 DIAGNOSIS — L89134 Pressure ulcer of right lower back, stage 4: Secondary | ICD-10-CM | POA: Diagnosis not present

## 2022-01-14 DIAGNOSIS — J9611 Chronic respiratory failure with hypoxia: Secondary | ICD-10-CM | POA: Diagnosis not present

## 2022-01-14 DIAGNOSIS — S2243XS Multiple fractures of ribs, bilateral, sequela: Secondary | ICD-10-CM | POA: Diagnosis not present

## 2022-01-14 DIAGNOSIS — J690 Pneumonitis due to inhalation of food and vomit: Secondary | ICD-10-CM | POA: Diagnosis not present

## 2022-01-14 DIAGNOSIS — N186 End stage renal disease: Secondary | ICD-10-CM | POA: Diagnosis not present

## 2022-01-14 DIAGNOSIS — J449 Chronic obstructive pulmonary disease, unspecified: Secondary | ICD-10-CM | POA: Diagnosis not present

## 2022-01-14 DIAGNOSIS — E1122 Type 2 diabetes mellitus with diabetic chronic kidney disease: Secondary | ICD-10-CM | POA: Diagnosis not present

## 2022-01-15 DIAGNOSIS — Z79899 Other long term (current) drug therapy: Secondary | ICD-10-CM | POA: Diagnosis not present

## 2022-01-15 DIAGNOSIS — J9611 Chronic respiratory failure with hypoxia: Secondary | ICD-10-CM | POA: Diagnosis not present

## 2022-01-15 DIAGNOSIS — J449 Chronic obstructive pulmonary disease, unspecified: Secondary | ICD-10-CM | POA: Diagnosis not present

## 2022-01-15 DIAGNOSIS — N186 End stage renal disease: Secondary | ICD-10-CM | POA: Diagnosis not present

## 2022-01-15 DIAGNOSIS — J690 Pneumonitis due to inhalation of food and vomit: Secondary | ICD-10-CM | POA: Diagnosis not present

## 2022-01-15 DIAGNOSIS — S22060S Wedge compression fracture of T7-T8 vertebra, sequela: Secondary | ICD-10-CM | POA: Diagnosis not present

## 2022-01-15 DIAGNOSIS — S2243XS Multiple fractures of ribs, bilateral, sequela: Secondary | ICD-10-CM | POA: Diagnosis not present

## 2022-01-17 DIAGNOSIS — M6281 Muscle weakness (generalized): Secondary | ICD-10-CM | POA: Diagnosis not present

## 2022-01-17 DIAGNOSIS — J690 Pneumonitis due to inhalation of food and vomit: Secondary | ICD-10-CM | POA: Diagnosis not present

## 2022-01-17 DIAGNOSIS — J449 Chronic obstructive pulmonary disease, unspecified: Secondary | ICD-10-CM | POA: Diagnosis not present

## 2022-01-17 DIAGNOSIS — S2243XS Multiple fractures of ribs, bilateral, sequela: Secondary | ICD-10-CM | POA: Diagnosis not present

## 2022-01-17 DIAGNOSIS — G894 Chronic pain syndrome: Secondary | ICD-10-CM | POA: Diagnosis not present

## 2022-01-17 DIAGNOSIS — I120 Hypertensive chronic kidney disease with stage 5 chronic kidney disease or end stage renal disease: Secondary | ICD-10-CM | POA: Diagnosis not present

## 2022-01-17 DIAGNOSIS — I480 Paroxysmal atrial fibrillation: Secondary | ICD-10-CM | POA: Diagnosis not present

## 2022-01-17 DIAGNOSIS — S22060S Wedge compression fracture of T7-T8 vertebra, sequela: Secondary | ICD-10-CM | POA: Diagnosis not present

## 2022-01-17 DIAGNOSIS — F419 Anxiety disorder, unspecified: Secondary | ICD-10-CM | POA: Diagnosis not present

## 2022-01-17 DIAGNOSIS — J9611 Chronic respiratory failure with hypoxia: Secondary | ICD-10-CM | POA: Diagnosis not present

## 2022-01-17 DIAGNOSIS — N186 End stage renal disease: Secondary | ICD-10-CM | POA: Diagnosis not present

## 2022-01-18 DIAGNOSIS — S2243XS Multiple fractures of ribs, bilateral, sequela: Secondary | ICD-10-CM | POA: Diagnosis not present

## 2022-01-18 DIAGNOSIS — N186 End stage renal disease: Secondary | ICD-10-CM | POA: Diagnosis not present

## 2022-01-18 DIAGNOSIS — J9611 Chronic respiratory failure with hypoxia: Secondary | ICD-10-CM | POA: Diagnosis not present

## 2022-01-18 DIAGNOSIS — S22060S Wedge compression fracture of T7-T8 vertebra, sequela: Secondary | ICD-10-CM | POA: Diagnosis not present

## 2022-01-18 DIAGNOSIS — J690 Pneumonitis due to inhalation of food and vomit: Secondary | ICD-10-CM | POA: Diagnosis not present

## 2022-01-18 DIAGNOSIS — J449 Chronic obstructive pulmonary disease, unspecified: Secondary | ICD-10-CM | POA: Diagnosis not present

## 2022-01-19 DIAGNOSIS — J690 Pneumonitis due to inhalation of food and vomit: Secondary | ICD-10-CM | POA: Diagnosis not present

## 2022-01-19 DIAGNOSIS — N186 End stage renal disease: Secondary | ICD-10-CM | POA: Diagnosis not present

## 2022-01-19 DIAGNOSIS — S2243XS Multiple fractures of ribs, bilateral, sequela: Secondary | ICD-10-CM | POA: Diagnosis not present

## 2022-01-19 DIAGNOSIS — S22060S Wedge compression fracture of T7-T8 vertebra, sequela: Secondary | ICD-10-CM | POA: Diagnosis not present

## 2022-01-19 DIAGNOSIS — J9611 Chronic respiratory failure with hypoxia: Secondary | ICD-10-CM | POA: Diagnosis not present

## 2022-01-19 DIAGNOSIS — J449 Chronic obstructive pulmonary disease, unspecified: Secondary | ICD-10-CM | POA: Diagnosis not present

## 2022-01-20 ENCOUNTER — Emergency Department (HOSPITAL_COMMUNITY): Payer: No Typology Code available for payment source

## 2022-01-20 ENCOUNTER — Inpatient Hospital Stay (HOSPITAL_COMMUNITY)
Admission: EM | Admit: 2022-01-20 | Discharge: 2022-01-27 | DRG: 291 | Disposition: A | Discharge status: Skilled Nursing Facility | Payer: No Typology Code available for payment source | Source: Skilled Nursing Facility | Attending: Internal Medicine | Admitting: Internal Medicine

## 2022-01-20 DIAGNOSIS — I5032 Chronic diastolic (congestive) heart failure: Secondary | ICD-10-CM | POA: Diagnosis present

## 2022-01-20 DIAGNOSIS — I132 Hypertensive heart and chronic kidney disease with heart failure and with stage 5 chronic kidney disease, or end stage renal disease: Principal | ICD-10-CM | POA: Diagnosis present

## 2022-01-20 DIAGNOSIS — N2581 Secondary hyperparathyroidism of renal origin: Secondary | ICD-10-CM | POA: Diagnosis present

## 2022-01-20 DIAGNOSIS — Z992 Dependence on renal dialysis: Secondary | ICD-10-CM | POA: Diagnosis not present

## 2022-01-20 DIAGNOSIS — G928 Other toxic encephalopathy: Secondary | ICD-10-CM | POA: Diagnosis present

## 2022-01-20 DIAGNOSIS — G894 Chronic pain syndrome: Secondary | ICD-10-CM | POA: Diagnosis not present

## 2022-01-20 DIAGNOSIS — Z9981 Dependence on supplemental oxygen: Secondary | ICD-10-CM

## 2022-01-20 DIAGNOSIS — Z87891 Personal history of nicotine dependence: Secondary | ICD-10-CM

## 2022-01-20 DIAGNOSIS — R059 Cough, unspecified: Secondary | ICD-10-CM | POA: Diagnosis not present

## 2022-01-20 DIAGNOSIS — E875 Hyperkalemia: Secondary | ICD-10-CM | POA: Diagnosis present

## 2022-01-20 DIAGNOSIS — E785 Hyperlipidemia, unspecified: Secondary | ICD-10-CM | POA: Diagnosis present

## 2022-01-20 DIAGNOSIS — R636 Underweight: Secondary | ICD-10-CM | POA: Diagnosis present

## 2022-01-20 DIAGNOSIS — D631 Anemia in chronic kidney disease: Secondary | ICD-10-CM | POA: Diagnosis present

## 2022-01-20 DIAGNOSIS — J441 Chronic obstructive pulmonary disease with (acute) exacerbation: Secondary | ICD-10-CM | POA: Diagnosis not present

## 2022-01-20 DIAGNOSIS — N186 End stage renal disease: Secondary | ICD-10-CM | POA: Diagnosis not present

## 2022-01-20 DIAGNOSIS — M898X9 Other specified disorders of bone, unspecified site: Secondary | ICD-10-CM | POA: Diagnosis present

## 2022-01-20 DIAGNOSIS — F419 Anxiety disorder, unspecified: Secondary | ICD-10-CM | POA: Diagnosis present

## 2022-01-20 DIAGNOSIS — Z79899 Other long term (current) drug therapy: Secondary | ICD-10-CM

## 2022-01-20 DIAGNOSIS — J9811 Atelectasis: Secondary | ICD-10-CM | POA: Diagnosis present

## 2022-01-20 DIAGNOSIS — R0603 Acute respiratory distress: Principal | ICD-10-CM

## 2022-01-20 DIAGNOSIS — Z8711 Personal history of peptic ulcer disease: Secondary | ICD-10-CM

## 2022-01-20 DIAGNOSIS — E872 Acidosis, unspecified: Secondary | ICD-10-CM | POA: Diagnosis present

## 2022-01-20 DIAGNOSIS — R7989 Other specified abnormal findings of blood chemistry: Secondary | ICD-10-CM | POA: Diagnosis not present

## 2022-01-20 DIAGNOSIS — R0602 Shortness of breath: Secondary | ICD-10-CM

## 2022-01-20 DIAGNOSIS — E871 Hypo-osmolality and hyponatremia: Secondary | ICD-10-CM | POA: Diagnosis present

## 2022-01-20 DIAGNOSIS — I480 Paroxysmal atrial fibrillation: Secondary | ICD-10-CM | POA: Diagnosis present

## 2022-01-20 DIAGNOSIS — Z8673 Personal history of transient ischemic attack (TIA), and cerebral infarction without residual deficits: Secondary | ICD-10-CM

## 2022-01-20 DIAGNOSIS — F411 Generalized anxiety disorder: Secondary | ICD-10-CM | POA: Diagnosis not present

## 2022-01-20 DIAGNOSIS — J9622 Acute and chronic respiratory failure with hypercapnia: Secondary | ICD-10-CM | POA: Diagnosis present

## 2022-01-20 DIAGNOSIS — Z85528 Personal history of other malignant neoplasm of kidney: Secondary | ICD-10-CM

## 2022-01-20 DIAGNOSIS — K2270 Barrett's esophagus without dysplasia: Secondary | ICD-10-CM | POA: Diagnosis present

## 2022-01-20 DIAGNOSIS — J80 Acute respiratory distress syndrome: Secondary | ICD-10-CM | POA: Diagnosis not present

## 2022-01-20 DIAGNOSIS — I120 Hypertensive chronic kidney disease with stage 5 chronic kidney disease or end stage renal disease: Secondary | ICD-10-CM | POA: Diagnosis not present

## 2022-01-20 DIAGNOSIS — Z515 Encounter for palliative care: Secondary | ICD-10-CM | POA: Diagnosis not present

## 2022-01-20 DIAGNOSIS — G4700 Insomnia, unspecified: Secondary | ICD-10-CM | POA: Diagnosis not present

## 2022-01-20 DIAGNOSIS — J9621 Acute and chronic respiratory failure with hypoxia: Secondary | ICD-10-CM | POA: Diagnosis present

## 2022-01-20 DIAGNOSIS — Z887 Allergy status to serum and vaccine status: Secondary | ICD-10-CM

## 2022-01-20 DIAGNOSIS — I252 Old myocardial infarction: Secondary | ICD-10-CM

## 2022-01-20 DIAGNOSIS — I2510 Atherosclerotic heart disease of native coronary artery without angina pectoris: Secondary | ICD-10-CM | POA: Diagnosis present

## 2022-01-20 DIAGNOSIS — Z1152 Encounter for screening for COVID-19: Secondary | ICD-10-CM

## 2022-01-20 DIAGNOSIS — Z79890 Hormone replacement therapy (postmenopausal): Secondary | ICD-10-CM

## 2022-01-20 DIAGNOSIS — Z8249 Family history of ischemic heart disease and other diseases of the circulatory system: Secondary | ICD-10-CM

## 2022-01-20 DIAGNOSIS — Z91158 Patient's noncompliance with renal dialysis for other reason: Secondary | ICD-10-CM

## 2022-01-20 DIAGNOSIS — Z91030 Bee allergy status: Secondary | ICD-10-CM

## 2022-01-20 DIAGNOSIS — I739 Peripheral vascular disease, unspecified: Secondary | ICD-10-CM | POA: Diagnosis present

## 2022-01-20 DIAGNOSIS — M1990 Unspecified osteoarthritis, unspecified site: Secondary | ICD-10-CM | POA: Diagnosis present

## 2022-01-20 DIAGNOSIS — R64 Cachexia: Secondary | ICD-10-CM | POA: Diagnosis present

## 2022-01-20 DIAGNOSIS — K219 Gastro-esophageal reflux disease without esophagitis: Secondary | ICD-10-CM | POA: Diagnosis present

## 2022-01-20 DIAGNOSIS — Z6821 Body mass index (BMI) 21.0-21.9, adult: Secondary | ICD-10-CM

## 2022-01-20 DIAGNOSIS — J9601 Acute respiratory failure with hypoxia: Secondary | ICD-10-CM | POA: Diagnosis not present

## 2022-01-20 DIAGNOSIS — I10 Essential (primary) hypertension: Secondary | ICD-10-CM | POA: Diagnosis not present

## 2022-01-20 DIAGNOSIS — J449 Chronic obstructive pulmonary disease, unspecified: Secondary | ICD-10-CM | POA: Diagnosis not present

## 2022-01-20 DIAGNOSIS — Z8616 Personal history of COVID-19: Secondary | ICD-10-CM | POA: Diagnosis not present

## 2022-01-20 DIAGNOSIS — E039 Hypothyroidism, unspecified: Secondary | ICD-10-CM | POA: Diagnosis present

## 2022-01-20 DIAGNOSIS — R54 Age-related physical debility: Secondary | ICD-10-CM | POA: Diagnosis present

## 2022-01-20 DIAGNOSIS — Z955 Presence of coronary angioplasty implant and graft: Secondary | ICD-10-CM

## 2022-01-20 LAB — CBC WITH DIFFERENTIAL/PLATELET
Abs Immature Granulocytes: 0.09 10*3/uL — ABNORMAL HIGH (ref 0.00–0.07)
Basophils Absolute: 0.1 10*3/uL (ref 0.0–0.1)
Basophils Relative: 1 %
Eosinophils Absolute: 0.1 10*3/uL (ref 0.0–0.5)
Eosinophils Relative: 1 %
HCT: 34.4 % — ABNORMAL LOW (ref 39.0–52.0)
Hemoglobin: 10.7 g/dL — ABNORMAL LOW (ref 13.0–17.0)
Immature Granulocytes: 1 %
Lymphocytes Relative: 4 %
Lymphs Abs: 0.5 10*3/uL — ABNORMAL LOW (ref 0.7–4.0)
MCH: 30.6 pg (ref 26.0–34.0)
MCHC: 31.1 g/dL (ref 30.0–36.0)
MCV: 98.3 fL (ref 80.0–100.0)
Monocytes Absolute: 0.1 10*3/uL (ref 0.1–1.0)
Monocytes Relative: 1 %
Neutro Abs: 12.5 10*3/uL — ABNORMAL HIGH (ref 1.7–7.7)
Neutrophils Relative %: 92 %
Platelets: 463 10*3/uL — ABNORMAL HIGH (ref 150–400)
RBC: 3.5 MIL/uL — ABNORMAL LOW (ref 4.22–5.81)
RDW: 15.1 % (ref 11.5–15.5)
WBC: 13.4 10*3/uL — ABNORMAL HIGH (ref 4.0–10.5)
nRBC: 0 % (ref 0.0–0.2)

## 2022-01-20 LAB — COMPREHENSIVE METABOLIC PANEL
ALT: 11 U/L (ref 0–44)
AST: 15 U/L (ref 15–41)
Albumin: 3.1 g/dL — ABNORMAL LOW (ref 3.5–5.0)
Alkaline Phosphatase: 122 U/L (ref 38–126)
Anion gap: 18 — ABNORMAL HIGH (ref 5–15)
BUN: 53 mg/dL — ABNORMAL HIGH (ref 8–23)
CO2: 23 mmol/L (ref 22–32)
Calcium: 8.8 mg/dL — ABNORMAL LOW (ref 8.9–10.3)
Chloride: 91 mmol/L — ABNORMAL LOW (ref 98–111)
Creatinine, Ser: 7.91 mg/dL — ABNORMAL HIGH (ref 0.61–1.24)
GFR, Estimated: 6 mL/min — ABNORMAL LOW (ref >60)
Glucose, Bld: 112 mg/dL — ABNORMAL HIGH (ref 70–99)
Potassium: 6.3 mmol/L (ref 3.5–5.1)
Sodium: 132 mmol/L — ABNORMAL LOW (ref 135–145)
Total Bilirubin: 2.2 mg/dL — ABNORMAL HIGH (ref 0.3–1.2)
Total Protein: 6.9 g/dL (ref 6.5–8.1)

## 2022-01-20 LAB — I-STAT CHEM 8, ED
BUN: 55 mg/dL — ABNORMAL HIGH (ref 8–23)
Calcium, Ion: 1.1 mmol/L — ABNORMAL LOW (ref 1.15–1.40)
Chloride: 96 mmol/L — ABNORMAL LOW (ref 98–111)
Creatinine, Ser: 8.4 mg/dL — ABNORMAL HIGH (ref 0.61–1.24)
Glucose, Bld: 120 mg/dL — ABNORMAL HIGH (ref 70–99)
HCT: 33 % — ABNORMAL LOW (ref 39.0–52.0)
Hemoglobin: 11.2 g/dL — ABNORMAL LOW (ref 13.0–17.0)
Potassium: 5.7 mmol/L — ABNORMAL HIGH (ref 3.5–5.1)
Sodium: 128 mmol/L — ABNORMAL LOW (ref 135–145)
TCO2: 24 mmol/L (ref 22–32)

## 2022-01-20 LAB — I-STAT VENOUS BLOOD GAS, ED
Acid-base deficit: 5 mmol/L — ABNORMAL HIGH (ref 0.0–2.0)
Bicarbonate: 23.8 mmol/L (ref 20.0–28.0)
Calcium, Ion: 1.09 mmol/L — ABNORMAL LOW (ref 1.15–1.40)
HCT: 33 % — ABNORMAL LOW (ref 39.0–52.0)
Hemoglobin: 11.2 g/dL — ABNORMAL LOW (ref 13.0–17.0)
O2 Saturation: 96 %
Potassium: 5.6 mmol/L — ABNORMAL HIGH (ref 3.5–5.1)
Sodium: 127 mmol/L — ABNORMAL LOW (ref 135–145)
TCO2: 26 mmol/L (ref 22–32)
pCO2, Ven: 59.8 mmHg (ref 44–60)
pH, Ven: 7.208 — ABNORMAL LOW (ref 7.25–7.43)
pO2, Ven: 105 mmHg — ABNORMAL HIGH (ref 32–45)

## 2022-01-20 LAB — CBG MONITORING, ED: Glucose-Capillary: 176 mg/dL — ABNORMAL HIGH (ref 70–99)

## 2022-01-20 LAB — HEPATITIS B CORE ANTIBODY, TOTAL: Hep B Core Total Ab: NONREACTIVE

## 2022-01-20 LAB — TROPONIN I (HIGH SENSITIVITY)
Troponin I (High Sensitivity): 13 ng/L (ref <18)
Troponin I (High Sensitivity): 12 ng/L (ref <18)

## 2022-01-20 LAB — BRAIN NATRIURETIC PEPTIDE: B Natriuretic Peptide: 2754.5 pg/mL — ABNORMAL HIGH (ref 0.0–100.0)

## 2022-01-20 LAB — RESP PANEL BY RT-PCR (FLU A&B, COVID) ARPGX2
Influenza A by PCR: NEGATIVE
Influenza B by PCR: NEGATIVE
SARS Coronavirus 2 by RT PCR: NEGATIVE

## 2022-01-20 LAB — HEPATITIS B SURFACE ANTIGEN: Hepatitis B Surface Ag: NONREACTIVE

## 2022-01-20 MED ORDER — CHLORHEXIDINE GLUCONATE CLOTH 2 % EX PADS: 6.0000 | MEDICATED_PAD | Freq: Every day | CUTANEOUS | Status: DC

## 2022-01-20 MED ORDER — ALBUTEROL (5 MG/ML) CONTINUOUS INHALATION SOLN
10.0000 mg/h | INHALATION_SOLUTION | Freq: Once | RESPIRATORY_TRACT | Status: AC
  Administered 2022-01-20: 10 mg/h via RESPIRATORY_TRACT
  Filled 2022-01-20: qty 20

## 2022-01-20 MED ORDER — PANTOPRAZOLE SODIUM 40 MG IV SOLR
40.0000 mg | INTRAVENOUS | Status: DC
  Administered 2022-01-21: 40 mg via INTRAVENOUS
  Filled 2022-01-20: qty 10

## 2022-01-20 MED ORDER — IPRATROPIUM-ALBUTEROL 0.5-2.5 (3) MG/3ML IN SOLN
3.0000 mL | Freq: Four times a day (QID) | RESPIRATORY_TRACT | Status: DC
  Administered 2022-01-20 – 2022-01-21 (×3): 3 mL via RESPIRATORY_TRACT
  Filled 2022-01-20 (×3): qty 3

## 2022-01-20 MED ORDER — DEXTROSE 50 % IV SOLN
1.0000 | Freq: Once | INTRAVENOUS | Status: AC
  Administered 2022-01-20: 50 mL via INTRAVENOUS
  Filled 2022-01-20: qty 50

## 2022-01-20 MED ORDER — HYDRALAZINE HCL 20 MG/ML IJ SOLN
10.0000 mg | INTRAMUSCULAR | Status: DC | PRN
  Administered 2022-01-21: 20 mg via INTRAVENOUS
  Filled 2022-01-20: qty 1

## 2022-01-20 MED ORDER — ALBUTEROL SULFATE (2.5 MG/3ML) 0.083% IN NEBU
INHALATION_SOLUTION | RESPIRATORY_TRACT | Status: AC
  Filled 2022-01-20: qty 12

## 2022-01-20 MED ORDER — INSULIN ASPART 100 UNIT/ML IV SOLN
5.0000 [IU] | Freq: Once | INTRAVENOUS | Status: AC
  Administered 2022-01-20: 5 [IU] via INTRAVENOUS

## 2022-01-20 MED ORDER — SODIUM CHLORIDE 0.9 % IV SOLN: 500.0000 mg | INTRAVENOUS | Status: DC

## 2022-01-20 MED ORDER — CALCIUM GLUCONATE-NACL 1-0.675 GM/50ML-% IV SOLN
1.0000 g | Freq: Once | INTRAVENOUS | Status: AC
  Administered 2022-01-21: 1000 mg via INTRAVENOUS
  Filled 2022-01-20: qty 50

## 2022-01-20 MED ORDER — MAGNESIUM SULFATE 2 GM/50ML IV SOLN
2.0000 g | Freq: Once | INTRAVENOUS | Status: AC
  Administered 2022-01-20: 2 g via INTRAVENOUS
  Filled 2022-01-20: qty 50

## 2022-01-20 MED ORDER — LORAZEPAM 2 MG/ML IJ SOLN
1.0000 mg | Freq: Once | INTRAMUSCULAR | Status: AC
  Administered 2022-01-20: 1 mg via INTRAMUSCULAR
  Filled 2022-01-20: qty 1

## 2022-01-20 MED ORDER — IPRATROPIUM-ALBUTEROL 0.5-2.5 (3) MG/3ML IN SOLN
3.0000 mL | Freq: Once | RESPIRATORY_TRACT | Status: AC
  Administered 2022-01-20: 3 mL via RESPIRATORY_TRACT

## 2022-01-20 MED ORDER — HEPARIN SODIUM (PORCINE) 1000 UNIT/ML DIALYSIS: 2000.0000 [IU] | INTRAMUSCULAR | Status: DC | PRN

## 2022-01-20 MED ORDER — HEPARIN SODIUM (PORCINE) 5000 UNIT/ML IJ SOLN
5000.0000 [IU] | Freq: Three times a day (TID) | INTRAMUSCULAR | Status: DC
  Administered 2022-01-21 – 2022-01-27 (×20): 5000 [IU] via SUBCUTANEOUS
  Filled 2022-01-20 (×20): qty 1

## 2022-01-20 MED ORDER — LORAZEPAM 2 MG/ML IJ SOLN
0.5000 mg | Freq: Once | INTRAMUSCULAR | Status: AC
  Administered 2022-01-20: 0.5 mg via INTRAVENOUS
  Filled 2022-01-20: qty 1

## 2022-01-20 MED ORDER — METHYLPREDNISOLONE SODIUM SUCC 125 MG IJ SOLR
125.0000 mg | Freq: Once | INTRAMUSCULAR | Status: AC
  Administered 2022-01-20: 125 mg via INTRAVENOUS
  Filled 2022-01-20: qty 2

## 2022-01-20 MED ORDER — IPRATROPIUM-ALBUTEROL 0.5-2.5 (3) MG/3ML IN SOLN
3.0000 mL | RESPIRATORY_TRACT | Status: DC | PRN
  Administered 2022-01-22 – 2022-01-23 (×2): 3 mL via RESPIRATORY_TRACT
  Filled 2022-01-20 (×2): qty 3

## 2022-01-20 MED ORDER — SODIUM CHLORIDE 0.9 % IV SOLN
2.0000 g | INTRAVENOUS | Status: DC
  Administered 2022-01-21 (×2): 2 g via INTRAVENOUS
  Filled 2022-01-20 (×2): qty 20

## 2022-01-20 NOTE — ED Notes (Signed)
Patient transported upstairs to dialysis with RT and RN on bedside monitor

## 2022-01-20 NOTE — Progress Notes (Signed)
Patient arrived by EMS with [facility] acting as blowby.  Per EMS, they had attempted CPAP on patient however patient did not tolerate.  Patient was placed on bipap with initial sats reading 81% prior to bipap administration.  At this time, patient is tolerating bipap and sats have improved to 100% on 50%.  Patient also receiving continuous albuterol nebulizer per MD order.  Will continue to monitor.

## 2022-01-20 NOTE — Progress Notes (Signed)
RT and RN transported [place] patient to Hemodialysis. Vital signs stable through out.

## 2022-01-20 NOTE — H&P (Addendum)
NAME:  Wayne Meadows, MRN:  [id number], DOB:  1934/09/17, LOS: 0 ADMISSION DATE:  01/20/2022, CONSULTATION DATE:  11/28 REFERRING MD:  Dr. Francia Greaves, CHIEF COMPLAINT:  acute respiratory failure w/ hypoxia   History of Present Illness:  Wayne Meadows is a 87 y.o. M who resides at [facility] and has PMH as outlined below including but not limited to ESRD on HD MWF (last Friday 11/24, missed session Monday 11/27), prior CVA, HTN, A-fib, COPD, SBO, COVID. He was brought to [facility] 11/28 for SOB and cough x 1 week.  He missed his dialysis session on 11/27 and began to feel worse after this.  On EMS arrival to the facility they attempted to place him on BiPAP which he promptly removed.  He was given 40 mg of Solu-Medrol to the ED.  In ED, he was placed on BiPAP after he had hypoxia and increased work of breathing.  His work of breathing improved on BiPAP.  He was also noted to be hypertensive with BP 180/77, repeats improved with most recent 130/57. CXR showed pulmonary edema with unchanged L pleural effusion and bibasilar atelectasis.  Nephrology was consulted for HD and due to his ongoing BiPAP requirements, PCCM asked to evaluate for ICU admission.   Pertinent  Medical History   Past Medical History:  Diagnosis Date   Abnormal echocardiogram 03/08/2021   Abnormal finding on GI tract imaging    Acute blood loss anemia    Acute pancreatitis 08/12/2020   Arteriovenous fistula infection ([place]) 04/18/2021   Arthritis    Carotid stenosis    Community acquired pneumonia 04/06/2021   COPD (chronic obstructive pulmonary disease) (HCC)    Coronary artery disease    S/p PCI 2011;  NSTEMI 12/12:  LHC/PCI 02/23/11: LAD 60% after the septal perforator, D1 occluded with distal collaterals, proximal RI 30-40%, AV circumflex stent patent with 60% stenosis after the stent, RCA 99%, EF 60-65%.  His RCA was treated with a bare-metal stent   CVA (cerebral infarction) 2011   Right cerebral; total obstruction of  the right ICA   Diverticulitis    Hypertension    Paroxysmal atrial fibrillation with RVR ([place]) 04/09/2021   Partial small bowel obstruction ([place]) 08/17/2021   Pleuritic chest pain 04/08/2014   Pressure injury of skin 03/30/2021   Rash 03/05/2021   Rectal bleeding 07/2015   Refractory nausea and vomiting 06/20/2020   Renal carcinoma ([place])    Sepsis, unspecified organism ([place]) 04/11/2016   Stroke ([place])    Tobacco abuse, in remission    Unstable angina ([place]) 03/18/2011     Significant Hospital Events: Including procedures, antibiotic start and stop dates in addition to other pertinent events   11/28: missed dialysis yesterday; admitted [facility] SOB; on bipap  Interim History / Subjective:  Patient just given 0.5 ativan for agitaiton due to anxiety and taking bipap mask off. Would desat to 31s.   Objective   Blood pressure (!) 160/62, pulse 92, temperature (!) 97.3 F (36.3 C), resp. rate 19, height 6' (1.829 m), weight 72 kg, SpO2 99 %.    FiO2 (%):  [50 %] 50 %   Intake/Output Summary (Last 24 hours) at 01/20/2022 1827 Last data filed at 01/20/2022 1649 Gross per 24 hour  Intake 50 ml  Output --  Net 50 ml   Filed Weights   01/20/22 1402  Weight: 72 kg    Examination: General:  critically ill appearing on mech vent HEENT: MM pink/moist; bipap in place Neuro: lethargic  recently given ativan CV: s1s2, no m/r/g PULM:  dim rhonchi BS bilaterally; bipap 10/5 50% sats 99% GI: distended; slightly firm, diminished BS Extremities: warm/dry, ble edema; RUE fistula with thrill Skin: no rashes or lesions appreciated   Assessment & Plan:   Acute Hypoxic respiratory failure with increased work of breathing - 2/2 volume overload in setting of missed dialysis along with COPD exacerbation and possible HCAP. Pulmonary Edema Chronic/unchanged L pleural effusion. Bibasilar atelectasis. P: - Continue BiPAP - ABG at 8:30 to evaluate mental status change; consider intubation if worsens - HD  for volume removal as below - duoneb scheduled - Empiric Ceftriaxone/azithro for AECOPD  ESRD on HD MWF - last session Friday 1/24, missed a session on Monday 11/27. Hyponatremia, Hyperkalemia - 2/2 above. Hypocalcemia. P: - Nephrology consulted, appreciate the assistance. - Nephrology planning for HD tonight for volume removal, then continue usual HD schedule thereafter. - 1g Ca gluconate; given insulin/dextrose  Acute encephalopathy: likely ativan related P: -limit sedating meds -abg at 8:30 pm -if sedation needed for bipap use consider low dose precedex   Distended Abdomen P: -will attempt KUB if able to lay flat  Hx HTN, PAF, CAD. P: - Volume removal as above. - hold home antihypertensives and statin while npo - Once able to come off BiPAP and take PO safely then can resume home Cardizem, Lopressor, Pravastatin. - prn hydralazine for htn  Hx Hypothyroidism. P: - Check TSH. - Continue home Synthroid when able to take po  Hx CVA. - hold ASA while npo  Hx of anxiety P: -hold buspar  GERD P: -PPI  Best Practice (right click and "Reselect all SmartList Selections" daily)   Diet/type: NPO DVT prophylaxis: prophylactic heparin  GI prophylaxis: PPI Lines: N/A Foley:  N/A Code Status:  full code Last date of multidisciplinary goals of care discussion [pending]  Labs   CBC: Recent Labs  Lab 01/20/22 1550 01/20/22 1617 01/20/22 1623  WBC 13.4*  --   --   NEUTROABS 12.5*  --   --   HGB 10.7* 11.2* 11.2*  HCT 34.4* 33.0* 33.0*  MCV 98.3  --   --   PLT 463*  --   --     Basic Metabolic Panel: Recent Labs  Lab 01/20/22 1550 01/20/22 1617 01/20/22 1623  NA 132* 127* 128*  K 6.3* 5.6* 5.7*  CL 91*  --  96*  CO2 23  --   --   GLUCOSE 112*  --  120*  BUN 53*  --  55*  CREATININE 7.91*  --  8.40*  CALCIUM 8.8*  --   --    GFR: Estimated Creatinine Clearance: 6.5 mL/min (A) (by C-G formula based on SCr of 8.4 mg/dL (H)). Recent Labs  Lab  01/20/22 1550  WBC 13.4*    Liver Function Tests: Recent Labs  Lab 01/20/22 1550  AST 15  ALT 11  ALKPHOS 122  BILITOT 2.2*  PROT 6.9  ALBUMIN 3.1*   No results for input(s): "LIPASE", "AMYLASE" in the last 168 hours. No results for input(s): "AMMONIA" in the last 168 hours.  ABG    Component Value Date/Time   HCO3 23.8 01/20/2022 1617   TCO2 24 01/20/2022 1623   ACIDBASEDEF 5.0 (H) 01/20/2022 1617   O2SAT 96 01/20/2022 1617     Coagulation Profile: No results for input(s): "INR", "PROTIME" in the last 168 hours.  Cardiac Enzymes: No results for input(s): "CKTOTAL", "CKMB", "CKMBINDEX", "TROPONINI" in the last 168 hours.  HbA1C:  Hgb A1c MFr Bld  Date/Time Value Ref Range Status  07/09/2020 05:00 AM 7.3 (H) 4.8 - 5.6 % Final    Comment:    (NOTE) Pre diabetes:          5.7%-6.4%  Diabetes:              >6.4%  Glycemic control for   <7.0% adults with diabetes     CBG: No results for input(s): "GLUCAP" in the last 168 hours.  Review of Systems:   Unable to obtain while on bipap and gets sob when off; obtained from chart review.  Past Medical History:  He,  has a past medical history of Abnormal echocardiogram (03/08/2021), Abnormal finding on GI tract imaging, Acute blood loss anemia, Acute pancreatitis (08/12/2020), Arteriovenous fistula infection ([place]) (04/18/2021), Arthritis, Carotid stenosis, Community acquired pneumonia (04/06/2021), COPD (chronic obstructive pulmonary disease) ([place]), Coronary artery disease, CVA (cerebral infarction) (2011), Diverticulitis, Hypertension, Paroxysmal atrial fibrillation with RVR ([place]) (04/09/2021), Partial small bowel obstruction ([place]) (08/17/2021), Pleuritic chest pain (04/08/2014), Pressure injury of skin (03/30/2021), Rash (03/05/2021), Rectal bleeding (07/2015), Refractory nausea and vomiting (06/20/2020), Renal carcinoma ([place]), Sepsis, unspecified organism ([place]) (04/11/2016), Stroke [facility]), Tobacco abuse, in remission, and Unstable  angina ([place]) (03/18/2011).   Surgical History:   Past Surgical History:  Procedure Laterality Date   AV FISTULA PLACEMENT Right 04/03/2021   Procedure: ARTERIOVENOUS (AV) CREATION OF RIGHT ARM BRACHIOCEPHALIC FISTULA;  Surgeon: Cherre Robins, MD;  Location: [facility] OR;  Service: Vascular;  Laterality: Right;   BIOPSY  04/08/2021   Procedure: BIOPSY;  Surgeon: Lavena Bullion, DO;  Location: [place];  Service: Gastroenterology;;   BIOPSY  11/06/2021   Procedure: BIOPSY;  Surgeon: Lavena Bullion, DO;  Location: [place];  Service: Gastroenterology;;   COLON SURGERY     COLONOSCOPY N/A 08/22/2015   Procedure: COLONOSCOPY;  Surgeon: [name], MD;  Location: [place] ENDOSCOPY;  Service: Endoscopy;  Laterality: N/A;   ESOPHAGOGASTRODUODENOSCOPY N/A 07/18/2020   Procedure: ESOPHAGOGASTRODUODENOSCOPY (EGD);  Surgeon: Milus Banister, MD;  Location: [facility];  Service: Endoscopy;  Laterality: N/A;   ESOPHAGOGASTRODUODENOSCOPY (EGD) WITH PROPOFOL N/A 06/21/2020   Procedure: ESOPHAGOGASTRODUODENOSCOPY (EGD) WITH PROPOFOL;  Surgeon: Irene Shipper, MD;  Location[facility] ENDOSCOPY;  Service: Endoscopy;  Laterality: N/A;   ESOPHAGOGASTRODUODENOSCOPY (EGD) WITH PROPOFOL N/A 04/08/2021   Procedure: ESOPHAGOGASTRODUODENOSCOPY (EGD) WITH PROPOFOL;  Surgeon: Lavena Bullion, DO;  Location: [place];  Service: Gastroenterology;  Laterality: N/A;   ESOPHAGOGASTRODUODENOSCOPY (EGD) WITH PROPOFOL N/A 11/06/2021   Procedure: ESOPHAGOGASTRODUODENOSCOPY (EGD) WITH PROPOFOL;  Surgeon: Lavena Bullion, DO;  Location: [place];  Service: Gastroenterology;  Laterality: N/A;   EUS N/A 07/18/2020   Procedure: UPPER ENDOSCOPIC ULTRASOUND (EUS) RADIAL;  Surgeon: Milus Banister, MD;  Location: [facility] ENDOSCOPY;  Service: Endoscopy;  Laterality: N/A;   FINE NEEDLE ASPIRATION N/A 07/18/2020   Procedure: FINE NEEDLE ASPIRATION (FNA) LINEAR;  Surgeon: Milus Banister, MD;  Location: [facility] ENDOSCOPY;  Service:  Endoscopy;  Laterality: N/A;   HEMOSTASIS CLIP PLACEMENT  04/08/2021   Procedure: HEMOSTASIS CLIP PLACEMENT;  Surgeon: Lavena Bullion, DO;  Location: [place] ENDOSCOPY;  Service: Gastroenterology;;   hip relacement     INCISION AND DRAINAGE Right 09/11/2021   Procedure: INCISION AND DRAINAGE OF RIGHT ARM FISTULA;  Surgeon: Cherre Robins, MD;  Location: [facility] OR;  Service: Vascular;  Laterality: Right;   IR FLUORO GUIDE CV LINE RIGHT  03/31/2021   IR REMOVAL TUN CV CATH W/O [facility]  08/15/2021  IR US GUIDE VASC ACCESS RIGHT  03/31/2021   KIDNEY SURGERY     LEFT HEART CATH AND CORONARY ANGIOGRAPHY N/A 07/09/2020   Procedure: LEFT HEART CATH AND CORONARY ANGIOGRAPHY;  Surgeon: Leonie Man, MD;  Location: [place] CV LAB;  Service: Cardiovascular;  Laterality: N/A;   LEFT HEART CATHETERIZATION WITH CORONARY ANGIOGRAM N/A 02/23/2011   Procedure: LEFT HEART CATHETERIZATION WITH CORONARY ANGIOGRAM;  Surgeon: [name], MD;  Location: [facility] CATH LAB;  Service: Cardiovascular;  Laterality: N/A;   LEFT HEART CATHETERIZATION WITH CORONARY ANGIOGRAM N/A 03/18/2011   Procedure: LEFT HEART CATHETERIZATION WITH CORONARY ANGIOGRAM;  Surgeon: [name], MD;  Location: [facility] CATH LAB;  Service: Cardiovascular;  Laterality: N/A;   PERCUTANEOUS CORONARY STENT INTERVENTION (PCI-S) N/A 02/23/2011   Procedure: PERCUTANEOUS CORONARY STENT INTERVENTION (PCI-S);  Surgeon: [name], MD;  Location: [facility] CATH LAB;  Service: Cardiovascular;  Laterality: N/A;   TEMPORARY PACEMAKER INSERTION N/A 02/23/2011   Procedure: TEMPORARY PACEMAKER INSERTION;  Surgeon: [name], MD;  Location: [facility] CATH LAB;  Service: Cardiovascular;  Laterality: N/A;   THROMBECTOMY W/ EMBOLECTOMY Right 04/03/2021   Procedure: THROMBECTOMY ARTERIOVENOUS FISTULA;  Surgeon: Cherre Robins, MD;  Location: [place];  Service: Vascular;  Laterality: Right;     Social History:   reports that he quit smoking about 14 years ago. His smoking use included  cigarettes. He has never used smokeless tobacco. He reports that he does not currently use alcohol after a past usage of about 1.0 standard drink of alcohol per week. He reports that he does not use drugs.   Family History:  His family history includes Heart attack (age of onset: 53) in an other family member.   Allergies Allergies  Allergen Reactions   Bee Venom Anaphylaxis   Influenza Vaccines Other (See Comments)    "Mortally sick for 2 weeks"     Home Medications  Prior to Admission medications   Medication Sig Start Date End Date Taking? Authorizing Provider  acetaminophen (TYLENOL) 325 MG tablet Take 2 tablets (650 mg total) by mouth every 6 (six) hours as needed for mild pain (or Fever >/= 101). Patient taking differently: Take 650 mg by mouth every 6 (six) hours as needed for mild pain (fever > 101F). 04/10/21  Yes Bonnielee Haff, MD  Amino Acids-Protein Hydrolys (FEEDING SUPPLEMENT, PRO-STAT SUGAR FREE 64,) LIQD Take 30 mLs by mouth every evening.   Yes [provider]  antiseptic oral rinse (BIOTENE) LIQD 10 mLs by Mouth Rinse route 2 (two) times daily.   Yes [provider]  benzonatate (TESSALON) 100 MG capsule Take 1 capsule (100 mg total) by mouth 3 (three) times daily. 10/30/21  Yes Lavina Hamman, MD  busPIRone (BUSPAR) 5 MG tablet Take 1 tablet (5 mg total) by mouth 3 (three) times daily. (0800 & 2000) Patient taking differently: Take 5 mg by mouth 3 (three) times daily. 10/30/21  Yes Lavina Hamman, MD  dextromethorphan-guaiFENesin [facility] DM) 30-600 MG 12hr tablet Take 1 tablet by mouth 2 (two) times daily. 10/30/21  Yes Lavina Hamman, MD  diltiazem (CARDIZEM CD) 240 MG 24 hr capsule Take 1 capsule (240 mg total) by mouth daily. Patient taking differently: Take 240 mg by mouth at bedtime. 10/30/21  Yes Lavina Hamman, MD  HYDROcodone-acetaminophen [facility]) 10-325 MG tablet Take 1 tablet by mouth every 4 (four) hours as needed for severe pain or moderate  pain. 10/30/21  Yes Lavina Hamman, MD  hydrOXYzine (  ATARAX) 10 MG tablet Take 10 mg by mouth every 6 (six) hours as needed for itching.   Yes [provider]  Ipratropium-Albuterol (COMBIVENT RESPIMAT) 20-100 MCG/ACT AERS respimat Inhale 1 puff into the lungs 4 (four) times daily.   Yes [provider]  ipratropium-albuterol (DUONEB) 0.5-2.5 (3) MG/3ML SOLN Take 3 mLs by nebulization every 4 (four) hours as needed (shortness of breath, wheezing).   Yes [provider]  levothyroxine (SYNTHROID) 75 MCG tablet Take 75 mcg by mouth in the morning. (0630)   Yes [provider]  melatonin 5 MG TABS Take 5 mg by mouth at bedtime.   Yes [provider]  Menthol, Topical Analgesic, (BIOFREEZE) 4 % GEL Apply 1 application  topically 3 (three) times daily as needed (topical analgesic).   Yes [provider]  metoprolol tartrate (LOPRESSOR) 25 MG tablet Take 0.5 tablets (12.5 mg total) by mouth 2 (two) times daily. 10/30/21  Yes Lavina Hamman, MD  Naphazoline-Glycerin (CLEAR EYES REDNESS RELIEF OP) Place 1-2 drops into the right eye 3 (three) times daily.   Yes [provider]  neomycin-bacitracin-polymyxin (NEOSPORIN) OINT Apply 1 application topically 3 (three) times daily. To right antecubital area 04/10/21  Yes Bonnielee Haff, MD  Nutritional Supplements (NOVASOURCE RENAL) LIQD Take 237 mLs by mouth 2 (two) times daily.   Yes [provider]  ondansetron (ZOFRAN) 4 MG tablet Take 4 mg by mouth every 6 (six) hours as needed for nausea or vomiting.   Yes [provider]  pantoprazole (PROTONIX) 40 MG tablet Take 40 mg by mouth in the morning.   Yes [provider]  polyethylene glycol (MIRALAX / GLYCOLAX) 17 g packet Take 17 g by mouth daily. Patient taking differently: Take 17 g by mouth in the morning. 05/01/21  Yes Sherwood Gambler, MD  pravastatin (PRAVACHOL) 40 MG tablet Take 40 mg by mouth at bedtime.   Yes [provider]  senna (SENOKOT) 8.6 MG TABS tablet Take 8.6 mg by mouth 2 (two) times daily.   Yes [provider]  sevelamer carbonate (RENVELA) 800 MG tablet Take 1,600 mg by mouth with breakfast, with lunch, and with evening meal.   Yes [provider]  sodium chloride (OCEAN) 0.65 % SOLN nasal spray Place 2 sprays into both nostrils 2 (two) times daily.   Yes [provider]  tamsulosin (FLOMAX) 0.4 MG CAPS capsule Take 1 capsule (0.4 mg total) by mouth daily. Patient taking differently: Take 0.4 mg by mouth in the morning. 04/11/21  Yes Bonnielee Haff, MD  Darbepoetin Alfa (ARANESP) 60 MCG/0.3ML SOSY injection Inject 0.3 mLs (60 mcg total) into the vein every Monday with hemodialysis. Patient not taking: Reported on 01/20/2022 09/30/21   Cherene Altes, MD     Critical care time: 45 minutes    JD Rexene Agent [facility] 01/20/2022, 6:27 PM  Please see [email or web address] for pager details.  From 7A-7P if no response, please call [phone]. After hours, please call ELink [phone].

## 2022-01-20 NOTE — Consult Note (Signed)
Renal Service Consult Note [facility]  [place] 01/20/2022 Sol Blazing, MD Requesting Physician: Dr. Francia Greaves  Reason for Consult: ESRD pt w/ resp distress HPI: The patient is a 87 y.o. year-old w/ PMH as below who presented to ED w/ SOB and cough for 1 week. Pt missed HD yesterday, last HD was Friday last week. In ED BP was 159/ 75, HR 102, RR 18- 24. Pt was placed on bipap and breathing has improved. CXR showed pulm edema and L basilar atx/ consolidation.  We are asked to see for dialysis.    Pt seen in room, pt is somnolent and not providing much history.     ROS - n/a   Past Medical History  Past Medical History:  Diagnosis Date   Abnormal echocardiogram 03/08/2021   Abnormal finding on GI tract imaging    Acute blood loss anemia    Acute pancreatitis 08/12/2020   Arteriovenous fistula infection ([place]) 04/18/2021   Arthritis    Carotid stenosis    Community acquired pneumonia 04/06/2021   COPD (chronic obstructive pulmonary disease) ([place])    Coronary artery disease    S/p PCI 2011;  NSTEMI 12/12:  LHC/PCI 02/23/11: LAD 60% after the septal perforator, D1 occluded with distal collaterals, proximal RI 30-40%, AV circumflex stent patent with 60% stenosis after the stent, RCA 99%, EF 60-65%.  His RCA was treated with a bare-metal stent   CVA (cerebral infarction) 2011   Right cerebral; total obstruction of the right ICA   Diverticulitis    Hypertension    Paroxysmal atrial fibrillation with RVR ([place]) 04/09/2021   Partial small bowel obstruction ([place]) 08/17/2021   Pleuritic chest pain 04/08/2014   Pressure injury of skin 03/30/2021   Rash 03/05/2021   Rectal bleeding 07/2015   Refractory nausea and vomiting 06/20/2020   Renal carcinoma ([place])    Sepsis, unspecified organism ([place]) 04/11/2016   Stroke ([place])    Tobacco abuse, in remission    Unstable angina ([place]) 03/18/2011   Past Surgical History  Past Surgical History:  Procedure Laterality Date   AV  FISTULA PLACEMENT Right 04/03/2021   Procedure: ARTERIOVENOUS (AV) CREATION OF RIGHT ARM BRACHIOCEPHALIC FISTULA;  Surgeon: Cherre Robins, MD;  Location: [place];  Service: Vascular;  Laterality: Right;   BIOPSY  04/08/2021   Procedure: BIOPSY;  Surgeon: Lavena Bullion, DO;  Location: [place];  Service: Gastroenterology;;   BIOPSY  11/06/2021   Procedure: BIOPSY;  Surgeon: Lavena Bullion, DO;  Location: [place];  Service: Gastroenterology;;   COLON SURGERY     COLONOSCOPY N/A 08/22/2015   Procedure: COLONOSCOPY;  Surgeon: [name], MD;  Location: [place] ENDOSCOPY;  Service: Endoscopy;  Laterality: N/A;   ESOPHAGOGASTRODUODENOSCOPY N/A 07/18/2020   Procedure: ESOPHAGOGASTRODUODENOSCOPY (EGD);  Surgeon: Milus Banister, MD;  Location: [facility];  Service: Endoscopy;  Laterality: N/A;   ESOPHAGOGASTRODUODENOSCOPY (EGD) WITH PROPOFOL N/A 06/21/2020   Procedure: ESOPHAGOGASTRODUODENOSCOPY (EGD) WITH PROPOFOL;  Surgeon: Irene Shipper, MD;  Location: [facility] ENDOSCOPY;  Service: Endoscopy;  Laterality: N/A;   ESOPHAGOGASTRODUODENOSCOPY (EGD) WITH PROPOFOL N/A 04/08/2021   Procedure: ESOPHAGOGASTRODUODENOSCOPY (EGD) WITH PROPOFOL;  Surgeon: Lavena Bullion, DO;  Location: [place];  Service: Gastroenterology;  Laterality: N/A;   ESOPHAGOGASTRODUODENOSCOPY (EGD) WITH PROPOFOL N/A 11/06/2021   Procedure: ESOPHAGOGASTRODUODENOSCOPY (EGD) WITH PROPOFOL;  Surgeon: Lavena Bullion, DO;  Location: [place];  Service: Gastroenterology;  Laterality: N/A;   EUS N/A 07/18/2020   Procedure: UPPER ENDOSCOPIC ULTRASOUND (EUS) RADIAL;  Surgeon: Milus Banister, MD;  Location: [facility];  Service: Endoscopy;  Laterality: N/A;   FINE NEEDLE ASPIRATION N/A 07/18/2020   Procedure: FINE NEEDLE ASPIRATION (FNA) LINEAR;  Surgeon: Milus Banister, MD;  Location: [facility] ENDOSCOPY;  Service: Endoscopy;  Laterality: N/A;   HEMOSTASIS CLIP PLACEMENT  04/08/2021   Procedure: HEMOSTASIS CLIP PLACEMENT;   Surgeon: Lavena Bullion, DO;  Location: [place];  Service: Gastroenterology;;   hip relacement     INCISION AND DRAINAGE Right 09/11/2021   Procedure: INCISION AND DRAINAGE OF RIGHT ARM FISTULA;  Surgeon: Cherre Robins, MD;  Location: [facility] OR;  Service: Vascular;  Laterality: Right;   IR FLUORO GUIDE CV LINE RIGHT  03/31/2021   IR REMOVAL TUN CV CATH W/O FL  08/15/2021   IR US GUIDE VASC ACCESS RIGHT  03/31/2021   KIDNEY SURGERY     LEFT HEART CATH AND CORONARY ANGIOGRAPHY N/A 07/09/2020   Procedure: LEFT HEART CATH AND CORONARY ANGIOGRAPHY;  Surgeon: Leonie Man, MD;  Location: [place] CV LAB;  Service: Cardiovascular;  Laterality: N/A;   LEFT HEART CATHETERIZATION WITH CORONARY ANGIOGRAM N/A 02/23/2011   Procedure: LEFT HEART CATHETERIZATION WITH CORONARY ANGIOGRAM;  Surgeon: [name], MD;  [facility] C[facility] LAB;  Service: Cardiovascular;  Laterality: N/A;   LEFT HEART CATHETERIZATION WITH CORONARY ANGIOGRAM N/A 03/18/2011   Procedure: LEFT HEART CATHETERIZATION WITH CORONARY ANGIOGRAM;  Surgeon: [name], MD;  Location: [facility] CATH LAB;  Service: Cardiovascular;  Laterality: N/A;   PERCUTANEOUS CORONARY STENT INTERVENTION (PCI-S) N/A 02/23/2011   Procedure: PERCUTANEOUS CORONARY STENT INTERVENTION (PCI-S);  Surgeon: [name], MD;  Location: [facility] CATH LAB;  Service: Cardiovascular;  Laterality: N/A;   TEMPORARY PACEMAKER INSERTION N/A 02/23/2011   Procedure: TEMPORARY PACEMAKER INSERTION;  Surgeon: [name], MD;  Location: [facility] CATH LAB;  Service: Cardiovascular;  Laterality: N/A;   THROMBECTOMY W/ EMBOLECTOMY Right 04/03/2021   Procedure: THROMBECTOMY ARTERIOVENOUS FISTULA;  Surgeon: Cherre Robins, MD;  Location: [facility] OR;  Service: Vascular;  Laterality: Right;   Family History  Family History  Problem Relation Age of Onset   Heart attack Other 35   Social History  reports that he quit smoking about 14 years ago. His smoking use included cigarettes. He has  never used smokeless tobacco. He reports that he does not currently use alcohol after a past usage of about 1.0 standard drink of alcohol per week. He reports that he does not use drugs. Allergies  Allergies  Allergen Reactions   Bee Venom Anaphylaxis   Influenza Vaccines Other (See Comments)    "Mortally sick for 2 weeks"   Home medications Prior to Admission medications   Medication Sig Start Date End Date Taking? Authorizing Provider  acetaminophen (TYLENOL) 325 MG tablet Take 2 tablets (650 mg total) by mouth every 6 (six) hours as needed for mild pain (or Fever >/= 101). Patient taking differently: Take 650 mg by mouth every 6 (six) hours as needed for mild pain (fever > 101F). 04/10/21  Yes Bonnielee Haff, MD  Amino Acids-Protein Hydrolys (FEEDING SUPPLEMENT, PRO-STAT SUGAR FREE 64,) LIQD Take 30 mLs by mouth every evening.   Yes [provider]  antiseptic oral rinse (BIOTENE) LIQD 10 mLs by Mouth Rinse route 2 (two) times daily.   Yes [provider]  benzonatate (TESSALON) 100 MG capsule Take 1 capsule (100 mg total) by mouth 3 (three) times daily. 10/30/21  Yes Lavina Hamman, MD  busPIRone (BUSPAR) 5 MG  tablet Take 1 tablet (5 mg total) by mouth 3 (three) times daily. (0800 & 2000) Patient taking differently: Take 5 mg by mouth 3 (three) times daily. 10/30/21  Yes Lavina Hamman, MD  dextromethorphan-guaiFENesin [facility] DM) 30-600 MG 12hr tablet Take 1 tablet by mouth 2 (two) times daily. 10/30/21  Yes Lavina Hamman, MD  diltiazem (CARDIZEM CD) 240 MG 24 hr capsule Take 1 capsule (240 mg total) by mouth daily. Patient taking differently: Take 240 mg by mouth at bedtime. 10/30/21  Yes Lavina Hamman, MD  HYDROcodone-acetaminophen [facility]) 10-325 MG tablet Take 1 tablet by mouth every 4 (four) hours as needed for severe pain or moderate pain. 10/30/21  Yes Lavina Hamman, MD  hydrOXYzine (ATARAX) 10 MG tablet Take 10 mg by mouth every 6 (six) hours as needed for itching.    Yes [provider]  Ipratropium-Albuterol (COMBIVENT RESPIMAT) 20-100 MCG/ACT AERS respimat Inhale 1 puff into the lungs 4 (four) times daily.   Yes [provider]  ipratropium-albuterol (DUONEB) 0.5-2.5 (3) MG/3ML SOLN Take 3 mLs by nebulization every 4 (four) hours as needed (shortness of breath, wheezing).   Yes [provider]  levothyroxine (SYNTHROID) 75 MCG tablet Take 75 mcg by mouth in the morning. (0630)   Yes [provider]  melatonin 5 MG TABS Take 5 mg by mouth at bedtime.   Yes [provider]  Menthol, Topical Analgesic, (BIOFREEZE) 4 % GEL Apply 1 application  topically 3 (three) times daily as needed (topical analgesic).   Yes [provider]  metoprolol tartrate (LOPRESSOR) 25 MG tablet Take 0.5 tablets (12.5 mg total) by mouth 2 (two) times daily. 10/30/21  Yes Lavina Hamman, MD  Naphazoline-Glycerin (CLEAR EYES REDNESS RELIEF OP) Place 1-2 drops into the right eye 3 (three) times daily.   Yes [provider]  neomycin-bacitracin-polymyxin (NEOSPORIN) OINT Apply 1 application topically 3 (three) times daily. To right antecubital area 04/10/21  Yes Bonnielee Haff, MD  Nutritional Supplements (NOVASOURCE RENAL) LIQD Take 237 mLs by mouth 2 (two) times daily.   Yes [provider]  ondansetron (ZOFRAN) 4 MG tablet Take 4 mg by mouth every 6 (six) hours as needed for nausea or vomiting.   Yes [provider]  pantoprazole (PROTONIX) 40 MG tablet Take 40 mg by mouth in the morning.   Yes [provider]  polyethylene glycol (MIRALAX / GLYCOLAX) 17 g packet Take 17 g by mouth daily. Patient taking differently: Take 17 g by mouth in the morning. 05/01/21  Yes Sherwood Gambler, MD  pravastatin (PRAVACHOL) 40 MG tablet Take 40 mg by mouth at bedtime.   Yes [provider]  senna (SENOKOT) 8.6 MG TABS tablet Take 8.6 mg by mouth 2 (two) times daily.   Yes [provider]  sevelamer  carbonate (RENVELA) 800 MG tablet Take 1,600 mg by mouth with breakfast, with lunch, and with evening meal.   Yes [provider]  sodium chloride (OCEAN) 0.65 % SOLN nasal spray Place 2 sprays into both nostrils 2 (two) times daily.   Yes [provider]  tamsulosin (FLOMAX) 0.4 MG CAPS capsule Take 1 capsule (0.4 mg total) by mouth daily. Patient taking differently: Take 0.4 mg by mouth in the morning. 04/11/21  Yes Bonnielee Haff, MD  Darbepoetin Alfa (ARANESP) 60 MCG/0.3ML SOSY injection Inject 0.3 mLs (60 mcg total) into the vein every Monday with hemodialysis. Patient not taking: Reported on 01/20/2022 09/30/21   Cherene Altes, MD  Vitals:   01/20/22 1615 01/20/22 1630 01/20/22 1645 01/20/22 1700  BP: (!) 145/62 (!) 135/57 (!) 131/57 (!) 136/59  Pulse: 91 90 88 88  Resp: '19 17 15 15  '$ Temp:      TempSrc:      SpO2: 97% 97% 96% 98%  Weight:      Height:       Exam Gen somnolent, on bipap, wob is stable No rash, cyanosis or gangrene Sclera anicteric No jvd or bruits Chest some scattered crackles at the bases bilat RRR no MRG Abd soft ntnd no mass or ascites +bs GU normal male MS no joint effusions or deformity Ext 1-2+  bilat pretib edema L> R, no wounds or ulcers Neuro is alert, Ox 3 , nf    RUA AVF+bruit   Home meds include - buspar, cardizem cd 240 qd, norco prn, combivent respimat, duoneb, synthroid, metoprolol 12.5 bid, pantoprazole, pravachol, renvela 2 ac tid, flomax, prns/ vits/ supps      OP HD: MWF [facility] 4h  400/1.5   72.5kg  3K/2.5Ca bath   Hep 3000  RUA AVF - last HD 11/24, post 74kg - mircera 75 ug q4, last 11/21 - no vdra  Assessment/ Plan: Resp distress - pulm edema by CXR +/- COPD +/- pna. IV abx per pmd. Pt missed HD yesterday, so vol excess is likely. Plan HD tonight upstairs, get volume down.  ESRD - usual HD MWF. Missed HD yesterday. HD tonight as above.  COPD - per pmd  HTN - cont cardizem while here.  Volume excess - w/  LE edema and pulm edema by CXR. As above.  MBD ckd - labs pending Anemia ckd - Hb 10.7, outpt esa not due for another 3 wks. Follow.     [name]  MD 01/20/2022, 5:16 PM Recent Labs  Lab 01/20/22 1617 01/20/22 1623  HGB 11.2* 11.2*  CREATININE  --  8.40*  K 5.6* 5.7*   Inpatient medications:  [START ON 01/21/2022] Chlorhexidine Gluconate Cloth  6 each Topical Q0600

## 2022-01-20 NOTE — ED Triage Notes (Signed)
[facility] from [employer] for SOB, cough x1 week, missed dialysis yesterday, last dialysis Friday

## 2022-01-20 NOTE — ED Provider Notes (Signed)
Patient is improved on BiPAP.  Patient with significant anxiety.  Initial administration of Ativan 2 mg IM seems to improved his anxiety and he tolerates BiPAP mask well.  Initial work-up is most suggestive of likely fluid overload.  Nephrology is aware of case and will follow with plan for inpatient dialysis.  Patient maintaining appropriate oxygenation with BiPAP mask in place.  After initial IM Ativan started to wear off the patient became more agitated.  He was picking at his mask and then removing it.  Patient with significant quick desaturation from BiPAP support to O2 sats in the 70s - 80s on room air alone.  Additional Ativan 0.5 mg IV given.  Patient became more relaxed and tolerates BiPAP well at this time.  Given patient's significant O2 requirement and need for BiPAP with close monitoring to avoid desaturation critical care made aware of case.    Critical care has evaluated the patient with plan to admit.   Valarie Merino, MD 01/20/22 [phone]

## 2022-01-20 NOTE — ED Notes (Signed)
Pt removing mask, asking for "Ativan". MD aware.

## 2022-01-20 NOTE — ED Notes (Signed)
Patient placement notified about patient being transferred to dialysis

## 2022-01-20 NOTE — ED Notes (Signed)
Xray at bedside to attempt abd Xray. Unsuccessful attempt.

## 2022-01-20 NOTE — ED Provider Notes (Signed)
[facility] EMERGENCY DEPARTMENT Provider Note   CSN: [id number] Arrival date & time: 01/20/22  1357     History  Chief Complaint  Patient presents with   Shortness of Breath    Wayne Meadows is a 87 y.o. male.   Shortness of Breath  87 year old male with medical history significant for HTN, prior CVA, CAD, COPD, diverticulitis, paroxysmal atrial fibrillation, pancreatitis, small bowel obstruction, ESRD on HD Monday Wednesday Friday who presents to the emergency department in respiratory distress after missed dialysis.  The patient states that he missed his dialysis session yesterday.  Last dialysis was on Friday.  He has had shortness of breath and a cough for the past week.  He presents from the [facility] facility.  No clear fevers or chills.  EMS attempted to administer BiPAP which the patient tore off.  He was administered 40 mg of IM Solu-Medrol at his facility.  He denies any chest pain.  Home Medications Prior to Admission medications   Medication Sig Start Date End Date Taking? Authorizing Provider  antiseptic oral rinse (BIOTENE) LIQD 10 mLs by Mouth Rinse route 2 (two) times daily.   Yes [provider]  benzonatate (TESSALON) 100 MG capsule Take 1 capsule (100 mg total) by mouth 3 (three) times daily. 10/30/21  Yes Lavina Hamman, MD  busPIRone (BUSPAR) 5 MG tablet Take 1 tablet (5 mg total) by mouth 3 (three) times daily. (0800 & 2000) Patient taking differently: Take 5 mg by mouth 3 (three) times daily. 10/30/21  Yes Lavina Hamman, MD  diltiazem (CARDIZEM CD) 240 MG 24 hr capsule Take 1 capsule (240 mg total) by mouth daily. Patient taking differently: Take 240 mg by mouth at bedtime. 10/30/21  Yes Lavina Hamman, MD  HYDROcodone-acetaminophen [facility]) 10-325 MG tablet Take 1 tablet by mouth every 4 (four) hours as needed for severe pain or moderate pain. 10/30/21  Yes Lavina Hamman, MD  Ipratropium-Albuterol (COMBIVENT RESPIMAT)  20-100 MCG/ACT AERS respimat Inhale 1 puff into the lungs 4 (four) times daily.   Yes [provider]  melatonin 5 MG TABS Take 5 mg by mouth at bedtime.   Yes [provider]  Naphazoline-Glycerin (CLEAR EYES REDNESS RELIEF OP) Place 1-2 drops into the right eye 3 (three) times daily.   Yes [provider]  neomycin-bacitracin-polymyxin (NEOSPORIN) OINT Apply 1 application topically 3 (three) times daily. To right antecubital area 04/10/21  Yes Bonnielee Haff, MD  pantoprazole (PROTONIX) 40 MG tablet Take 40 mg by mouth in the morning.   Yes [provider]  polyethylene glycol (MIRALAX / GLYCOLAX) 17 g packet Take 17 g by mouth daily. Patient taking differently: Take 17 g by mouth in the morning. 05/01/21  Yes Sherwood Gambler, MD  pravastatin (PRAVACHOL) 40 MG tablet Take 40 mg by mouth at bedtime.   Yes [provider]  sodium chloride (OCEAN) 0.65 % SOLN nasal spray Place 2 sprays into both nostrils 2 (two) times daily.   Yes [provider]  tamsulosin (FLOMAX) 0.4 MG CAPS capsule Take 1 capsule (0.4 mg total) by mouth daily. Patient taking differently: Take 0.4 mg by mouth in the morning. 04/11/21  Yes Bonnielee Haff, MD  acetaminophen (TYLENOL) 325 MG tablet Take 2 tablets (650 mg total) by mouth every 6 (six) hours as needed for mild pain (or Fever >/= 101). Patient taking differently: Take 650 mg by mouth every 6 (six) hours as needed for mild pain (fever > 101).  04/10/21   Bonnielee Haff, MD  Darbepoetin Alfa (ARANESP) 60 MCG/0.3ML SOSY injection Inject 0.3 mLs (60 mcg total) into the vein every Monday with hemodialysis. 09/30/21   Cherene Altes, MD  dextromethorphan-guaiFENesin (MUCINEX DM) 30-600 MG 12hr tablet Take 1 tablet by mouth 2 (two) times daily. 10/30/21   Lavina Hamman, MD  metoprolol tartrate (LOPRESSOR) 25 MG tablet Take 0.5 tablets (12.5 mg total) by mouth 2 (two) times daily. 10/30/21   Lavina Hamman, MD       Allergies    Bee venom and Influenza vaccines    Review of Systems   Review of Systems  Unable to perform ROS: Acuity of condition  Respiratory:  Positive for shortness of breath.     Physical Exam Updated Vital Signs BP (!) 150/67   Pulse 100   Temp 98.2 F (36.8 C) (Axillary)   Resp (!) 31   Ht 6' (1.829 m)   Wt 72 kg   SpO2 92%   BMI 21.53 kg/m  Physical Exam Vitals and nursing note reviewed.  Constitutional:      General: He is in acute distress.     Appearance: He is ill-appearing.  HENT:     Head: Normocephalic and atraumatic.  Eyes:     Conjunctiva/sclera: Conjunctivae normal.     Pupils: Pupils are equal, round, and reactive to light.  Cardiovascular:     Rate and Rhythm: Normal rate and regular rhythm.  Pulmonary:     Effort: Tachypnea, accessory muscle usage and respiratory distress present.     Breath sounds: Examination of the right-upper field reveals wheezing. Examination of the left-upper field reveals wheezing. Examination of the right-middle field reveals wheezing. Examination of the left-middle field reveals wheezing. Examination of the right-lower field reveals wheezing and rales. Examination of the left-lower field reveals wheezing and rales. Wheezing and rales present.  Abdominal:     General: There is no distension.     Tenderness: There is no guarding.  Musculoskeletal:        General: No deformity or signs of injury.     Cervical back: Neck supple.     Right lower leg: Edema present.     Left lower leg: Edema present.     Comments: Trace bilateral lower extremity pitting edema  Skin:    Findings: No lesion or rash.  Neurological:     General: No focal deficit present.     Mental Status: He is alert. Mental status is at baseline.     ED Results / Procedures / Treatments   Labs (all labs ordered are listed, but only abnormal results are displayed) Labs Reviewed  RESP PANEL BY RT-PCR (FLU A&B, COVID) [id number]  CBC WITH  DIFFERENTIAL/PLATELET  COMPREHENSIVE METABOLIC PANEL  BRAIN NATRIURETIC PEPTIDE  I-STAT VENOUS BLOOD GAS, ED  I-STAT CHEM 8, ED  TROPONIN I (HIGH SENSITIVITY)    EKG EKG Interpretation  Date/Time:  Tuesday January 20 2022 14:04:23 EST Ventricular Rate:  102 PR Interval:  204 QRS Duration: 110 QT Interval:  355 QTC Calculation: 463 R Axis:   73 Text Interpretation: Sinus or ectopic atrial tachycardia LVH with secondary repolarization abnormality Anterior infarct, old Artifact in lead(s) I II III aVR aVL aVF V1 V2 V3 V4 V5 V6 Confirmed by Regan Lemming ([id number]) on 01/20/2022 2:24:01 PM  Radiology DG Chest Port 1 View  Result Date: 01/20/2022 CLINICAL DATA:  Shortness of breath EXAM: PORTABLE CHEST 1 VIEW COMPARISON:  Radiograph 01/01/2019 FINDINGS: Unchanged cardiomediastinal silhouette.  There is a moderate size left pleural effusion with distal basilar consolidation, similar prior. There are increased diffuse interstitial opacities and left upper lung alveolar opacities. Skin fold overlies the right upper chest. No evidence of pneumothorax. Bones are unchanged including multiple thoracic compression deformity. IMPRESSION: Increased pulmonary edema. Unchanged moderate size left pleural effusion with adjacent dense consolidation or atelectasis. Electronically Signed   By: Maurine Simmering M.D.   On: 01/20/2022 14:51    Procedures .Critical Care  Performed by: Regan Lemming, MD Authorized by: Regan Lemming, MD   Critical care provider statement:    Critical care time (minutes):  30   Critical care was necessary to treat or prevent imminent or life-threatening deterioration of the following conditions:  Respiratory failure   Critical care was time spent personally by me on the following activities:  Development of treatment plan with patient or surrogate, discussions with consultants, evaluation of patient's response to treatment, examination of patient, ordering and review of laboratory  studies, ordering and review of radiographic studies, ordering and performing treatments and interventions, pulse oximetry, re-evaluation of patient's condition and review of old charts     Medications Ordered in ED Medications  magnesium sulfate IVPB 2 g 50 mL (has no administration in time range)  methylPREDNISolone sodium succinate (SOLU-MEDROL) 125 mg/2 mL injection 125 mg (has no administration in time range)  albuterol (PROVENTIL,VENTOLIN) solution continuous neb (10 mg/hr Nebulization Given 01/20/22 1409)  albuterol (PROVENTIL) (2.5 MG/3ML) 0.083% nebulizer solution (  Given 01/20/22 1409)  LORazepam (ATIVAN) injection 1 mg (1 mg Intramuscular Given 01/20/22 1440)  LORazepam (ATIVAN) injection 1 mg (1 mg Intramuscular Given 01/20/22 1527)    ED Course/ Medical Decision Making/ A&P                           Medical Decision Making Amount and/or Complexity of Data Reviewed Labs: ordered. Radiology: ordered.  Risk Prescription drug management.   87 year old male with medical history significant for HTN, prior CVA, CAD, COPD, diverticulitis, paroxysmal atrial fibrillation, pancreatitis, small bowel obstruction, ESRD on HD Monday Wednesday Friday who presents to the emergency department in respiratory distress after missed dialysis.  The patient states that he missed his dialysis session yesterday.  Last dialysis was on Friday.  He has had shortness of breath and a cough for the past week.  He presents from the [place] facility.  No clear fevers or chills.  EMS attempted to administer BiPAP which the patient tore off.  He was administered 40 mg of IM Solu-Medrol at his facility.  He denies any chest pain.  On arrival, the patient was afebrile, temperature 98.2, tachycardic P103, tachypneic RR 25, saturating 81% on room air, subsequently placed on BiPAP with improvement in O2 saturations 100%, hypertensive BP 180/77.  Sinus tachycardia noted on cardiac telemetry.  Physical  exam concerning for mixed COPD exacerbation versus volume overload with bilateral pitting edema, wheezing throughout all lung fields, bibasilar Rales.  In the setting of the patient's missed dialysis, considered volume overload/hypervolemia versus viral infection, COVID-19, influenza, COPD exacerbation.  While on BiPAP, the patient settled and maintain his saturations.  He was administered a continuous albuterol nebulizer 10 mg/h, IV magnesium, IV Solu-Medrol.  Plan at time of signout to reassess the patient, plan for likely admission, follow-up patient labs which were pending at time of signout.  Patient appeared more comfortable on BiPAP, endorse some anxiousness for which Ativan was administered.  Signout given to Dr. Francia Greaves  at 1500.   Final Clinical Impression(s) / ED Diagnoses Final diagnoses:  Respiratory distress  COPD exacerbation ([place])  Shortness of breath  ESRD (end stage renal disease) ([place])    Rx / DC Orders ED Discharge Orders     None         Regan Lemming, MD 01/20/22 1532

## 2022-01-20 NOTE — ED Notes (Signed)
Multiple IV attempts, unsuccessful. IV team consult placed.

## 2022-01-20 NOTE — Progress Notes (Signed)
Patient is pulling the Bipap mask off because he states it's too much pressure.  RT and Dr. Francia Greaves reattached the Bipap mask and titrated the pressures to IPAP 10, EPAP 5, VT 575. Patient states it's better at this time.

## 2022-01-20 NOTE — ED Notes (Signed)
Pt anxious, taking mask off. MD informed.

## 2022-01-20 NOTE — ED Notes (Signed)
Pt taking bipap mask off. Unable to redirect to keep mask on. MD at bedside.

## 2022-01-21 ENCOUNTER — Inpatient Hospital Stay (HOSPITAL_COMMUNITY): Payer: No Typology Code available for payment source

## 2022-01-21 DIAGNOSIS — R7989 Other specified abnormal findings of blood chemistry: Secondary | ICD-10-CM

## 2022-01-21 DIAGNOSIS — J9601 Acute respiratory failure with hypoxia: Secondary | ICD-10-CM | POA: Diagnosis not present

## 2022-01-21 LAB — COMPREHENSIVE METABOLIC PANEL
ALT: 12 U/L (ref 0–44)
AST: 17 U/L (ref 15–41)
Albumin: 2.8 g/dL — ABNORMAL LOW (ref 3.5–5.0)
Alkaline Phosphatase: 113 U/L (ref 38–126)
Anion gap: 18 — ABNORMAL HIGH (ref 5–15)
BUN: 29 mg/dL — ABNORMAL HIGH (ref 8–23)
CO2: 22 mmol/L (ref 22–32)
Calcium: 8.3 mg/dL — ABNORMAL LOW (ref 8.9–10.3)
Chloride: 91 mmol/L — ABNORMAL LOW (ref 98–111)
Creatinine, Ser: 4.97 mg/dL — ABNORMAL HIGH (ref 0.61–1.24)
GFR, Estimated: 11 mL/min — ABNORMAL LOW (ref >60)
Glucose, Bld: 104 mg/dL — ABNORMAL HIGH (ref 70–99)
Potassium: 4.5 mmol/L (ref 3.5–5.1)
Sodium: 131 mmol/L — ABNORMAL LOW (ref 135–145)
Total Bilirubin: 1.8 mg/dL — ABNORMAL HIGH (ref 0.3–1.2)
Total Protein: 6.6 g/dL (ref 6.5–8.1)

## 2022-01-21 LAB — POCT I-STAT 7, (LYTES, BLD GAS, ICA,H+H)
Acid-Base Excess: 0 mmol/L (ref 0.0–2.0)
Bicarbonate: 25.9 mmol/L (ref 20.0–28.0)
Calcium, Ion: 1.13 mmol/L — ABNORMAL LOW (ref 1.15–1.40)
HCT: 33 % — ABNORMAL LOW (ref 39.0–52.0)
Hemoglobin: 11.2 g/dL — ABNORMAL LOW (ref 13.0–17.0)
O2 Saturation: 93 %
Patient temperature: 98.5
Potassium: 4.4 mmol/L (ref 3.5–5.1)
Sodium: 132 mmol/L — ABNORMAL LOW (ref 135–145)
TCO2: 27 mmol/L (ref 22–32)
pCO2 arterial: 48.7 mmHg — ABNORMAL HIGH (ref 32–48)
pH, Arterial: 7.334 — ABNORMAL LOW (ref 7.35–7.45)
pO2, Arterial: 72 mmHg — ABNORMAL LOW (ref 83–108)

## 2022-01-21 LAB — BLOOD GAS, ARTERIAL
Acid-Base Excess: 3.7 mmol/L — ABNORMAL HIGH (ref 0.0–2.0)
Bicarbonate: 29.7 mmol/L — ABNORMAL HIGH (ref 20.0–28.0)
Drawn by: 23604
O2 Saturation: 99.2 %
Patient temperature: 36.8
pCO2 arterial: 49 mmHg — ABNORMAL HIGH (ref 32–48)
pH, Arterial: 7.39 (ref 7.35–7.45)
pO2, Arterial: 113 mmHg — ABNORMAL HIGH (ref 83–108)

## 2022-01-21 LAB — CBC
HCT: 33.4 % — ABNORMAL LOW (ref 39.0–52.0)
Hemoglobin: 10.7 g/dL — ABNORMAL LOW (ref 13.0–17.0)
MCH: 30.8 pg (ref 26.0–34.0)
MCHC: 32 g/dL (ref 30.0–36.0)
MCV: 96.3 fL (ref 80.0–100.0)
Platelets: 349 10*3/uL (ref 150–400)
RBC: 3.47 MIL/uL — ABNORMAL LOW (ref 4.22–5.81)
RDW: 15 % (ref 11.5–15.5)
WBC: 10.1 10*3/uL (ref 4.0–10.5)
nRBC: 0 % (ref 0.0–0.2)

## 2022-01-21 LAB — ECHOCARDIOGRAM COMPLETE
Area-P 1/2: 6.6 cm2
Calc EF: 43.6 %
Height: 72 in
MV VTI: 2.85 cm2
S' Lateral: 4.3 cm
Single Plane A2C EF: 44.4 %
Single Plane A4C EF: 42.5 %
Weight: 2670.21 oz

## 2022-01-21 LAB — GLUCOSE, CAPILLARY
Glucose-Capillary: 100 mg/dL — ABNORMAL HIGH (ref 70–99)
Glucose-Capillary: 105 mg/dL — ABNORMAL HIGH (ref 70–99)
Glucose-Capillary: 111 mg/dL — ABNORMAL HIGH (ref 70–99)
Glucose-Capillary: 112 mg/dL — ABNORMAL HIGH (ref 70–99)
Glucose-Capillary: 97 mg/dL (ref 70–99)
Glucose-Capillary: 97 mg/dL (ref 70–99)

## 2022-01-21 LAB — TSH: TSH: 4.573 u[IU]/mL — ABNORMAL HIGH (ref 0.350–4.500)

## 2022-01-21 LAB — MAGNESIUM: Magnesium: 1.9 mg/dL (ref 1.7–2.4)

## 2022-01-21 LAB — T4, FREE: Free T4: 0.66 ng/dL (ref 0.61–1.12)

## 2022-01-21 MED ORDER — DEXMEDETOMIDINE HCL IN NACL 400 MCG/100ML IV SOLN
0.4000 ug/kg/h | INTRAVENOUS | Status: DC
  Administered 2022-01-21: 0.4 ug/kg/h via INTRAVENOUS
  Administered 2022-01-22: 1 ug/kg/h via INTRAVENOUS
  Filled 2022-01-21 (×2): qty 100

## 2022-01-21 MED ORDER — LORAZEPAM 2 MG/ML IJ SOLN
0.5000 mg | INTRAMUSCULAR | Status: DC | PRN
  Administered 2022-01-22 – 2022-01-26 (×7): 0.5 mg via INTRAVENOUS
  Filled 2022-01-21 (×8): qty 1

## 2022-01-21 MED ORDER — CHLORHEXIDINE GLUCONATE CLOTH 2 % EX PADS
6.0000 | MEDICATED_PAD | Freq: Every day | CUTANEOUS | Status: DC
  Administered 2022-01-22 – 2022-01-24 (×2): 6 via TOPICAL

## 2022-01-21 MED ORDER — BUSPIRONE HCL 5 MG PO TABS
5.0000 mg | ORAL_TABLET | Freq: Three times a day (TID) | ORAL | Status: DC
  Administered 2022-01-21 – 2022-01-27 (×15): 5 mg via ORAL
  Filled 2022-01-21 (×15): qty 1

## 2022-01-21 MED ORDER — METOPROLOL TARTRATE 5 MG/5ML IV SOLN
2.5000 mg | INTRAVENOUS | Status: DC | PRN
  Administered 2022-01-22 (×2): 2.5 mg via INTRAVENOUS
  Filled 2022-01-21 (×2): qty 5

## 2022-01-21 MED ORDER — FUROSEMIDE 10 MG/ML IJ SOLN
120.0000 mg | Freq: Once | INTRAVENOUS | Status: AC
  Administered 2022-01-21: 120 mg via INTRAVENOUS
  Filled 2022-01-21: qty 10

## 2022-01-21 MED ORDER — LEVOTHYROXINE SODIUM 75 MCG PO TABS
75.0000 ug | ORAL_TABLET | Freq: Every morning | ORAL | Status: DC
  Administered 2022-01-24 – 2022-01-27 (×4): 75 ug via ORAL
  Filled 2022-01-21 (×4): qty 1

## 2022-01-21 MED ORDER — METHYLPREDNISOLONE SODIUM SUCC 40 MG IJ SOLR
40.0000 mg | Freq: Two times a day (BID) | INTRAMUSCULAR | Status: DC
  Administered 2022-01-21 – 2022-01-22 (×2): 40 mg via INTRAVENOUS
  Filled 2022-01-21 (×2): qty 1

## 2022-01-21 MED ORDER — DILTIAZEM HCL ER COATED BEADS 240 MG PO CP24
240.0000 mg | ORAL_CAPSULE | Freq: Every day | ORAL | Status: DC
  Filled 2022-01-21 (×2): qty 1

## 2022-01-21 MED ORDER — HYDRALAZINE HCL 20 MG/ML IJ SOLN: 20.0000 mg | Freq: Once | INTRAMUSCULAR | Status: DC

## 2022-01-21 MED ORDER — ALBUTEROL SULFATE (2.5 MG/3ML) 0.083% IN NEBU
INHALATION_SOLUTION | RESPIRATORY_TRACT | Status: AC
  Administered 2022-01-21: 2.5 mg via RESPIRATORY_TRACT
  Filled 2022-01-21: qty 3

## 2022-01-21 MED ORDER — LORAZEPAM 2 MG/ML IJ SOLN
1.0000 mg | Freq: Once | INTRAMUSCULAR | Status: AC
  Administered 2022-01-21: 1 mg via INTRAVENOUS

## 2022-01-21 MED ORDER — BUDESONIDE 0.25 MG/2ML IN SUSP
0.2500 mg | Freq: Two times a day (BID) | RESPIRATORY_TRACT | Status: DC
  Administered 2022-01-21 – 2022-01-27 (×13): 0.25 mg via RESPIRATORY_TRACT
  Filled 2022-01-21 (×13): qty 2

## 2022-01-21 MED ORDER — SODIUM CHLORIDE 0.9 % IV SOLN
500.0000 mg | INTRAVENOUS | Status: DC
  Administered 2022-01-21 – 2022-01-22 (×2): 500 mg via INTRAVENOUS
  Filled 2022-01-21 (×2): qty 5

## 2022-01-21 MED ORDER — METOPROLOL TARTRATE 12.5 MG HALF TABLET
12.5000 mg | ORAL_TABLET | Freq: Two times a day (BID) | ORAL | Status: DC
  Administered 2022-01-22 – 2022-01-23 (×2): 12.5 mg via ORAL
  Filled 2022-01-21 (×2): qty 1

## 2022-01-21 MED ORDER — HEPARIN SODIUM (PORCINE) 1000 UNIT/ML IJ SOLN
INTRAMUSCULAR | Status: AC
  Administered 2022-01-21: 3000 [IU]
  Filled 2022-01-21: qty 3

## 2022-01-21 MED ORDER — PANTOPRAZOLE SODIUM 40 MG PO TBEC
40.0000 mg | DELAYED_RELEASE_TABLET | Freq: Every morning | ORAL | Status: DC
  Administered 2022-01-22 – 2022-01-23 (×2): 40 mg via ORAL
  Filled 2022-01-21 (×2): qty 1

## 2022-01-21 MED ORDER — SENNA 8.6 MG PO TABS
8.6000 mg | ORAL_TABLET | Freq: Two times a day (BID) | ORAL | Status: DC
  Administered 2022-01-22 – 2022-01-27 (×8): 8.6 mg via ORAL
  Filled 2022-01-21 (×8): qty 1

## 2022-01-21 MED ORDER — ACETAMINOPHEN 325 MG PO TABS
650.0000 mg | ORAL_TABLET | Freq: Four times a day (QID) | ORAL | Status: DC | PRN
  Administered 2022-01-26: 650 mg via ORAL
  Filled 2022-01-21: qty 2

## 2022-01-21 MED ORDER — ALBUTEROL SULFATE (2.5 MG/3ML) 0.083% IN NEBU: 2.5000 mg | INHALATION_SOLUTION | Freq: Once | RESPIRATORY_TRACT | Status: AC

## 2022-01-21 MED ORDER — PRAVASTATIN SODIUM 40 MG PO TABS
40.0000 mg | ORAL_TABLET | Freq: Every day | ORAL | Status: DC
  Administered 2022-01-23 – 2022-01-26 (×4): 40 mg via ORAL
  Filled 2022-01-21 (×4): qty 1

## 2022-01-21 MED ORDER — MUPIROCIN 2 % EX OINT: 1.0000 | TOPICAL_OINTMENT | Freq: Two times a day (BID) | CUTANEOUS | Status: DC

## 2022-01-21 MED ORDER — IPRATROPIUM-ALBUTEROL 0.5-2.5 (3) MG/3ML IN SOLN
3.0000 mL | RESPIRATORY_TRACT | Status: DC
  Administered 2022-01-21 – 2022-01-23 (×12): 3 mL via RESPIRATORY_TRACT
  Filled 2022-01-21 (×11): qty 3

## 2022-01-21 MED ORDER — METHYLPREDNISOLONE SODIUM SUCC 125 MG IJ SOLR
80.0000 mg | Freq: Once | INTRAMUSCULAR | Status: AC
  Administered 2022-01-21: 80 mg via INTRAVENOUS
  Filled 2022-01-21: qty 2

## 2022-01-21 MED ORDER — DM-GUAIFENESIN ER 30-600 MG PO TB12
1.0000 | ORAL_TABLET | Freq: Two times a day (BID) | ORAL | Status: DC
  Administered 2022-01-22 – 2022-01-27 (×10): 1 via ORAL
  Filled 2022-01-21 (×13): qty 1

## 2022-01-21 MED ORDER — ORAL CARE MOUTH RINSE
15.0000 mL | OROMUCOSAL | Status: DC
  Administered 2022-01-21 – 2022-01-27 (×22): 15 mL via OROMUCOSAL

## 2022-01-21 MED ORDER — POLYETHYLENE GLYCOL 3350 17 G PO PACK
17.0000 g | PACK | Freq: Every day | ORAL | Status: DC
  Administered 2022-01-22 – 2022-01-25 (×4): 17 g via ORAL
  Filled 2022-01-21 (×5): qty 1

## 2022-01-21 MED ORDER — HYDROXYZINE HCL 10 MG PO TABS
10.0000 mg | ORAL_TABLET | Freq: Four times a day (QID) | ORAL | Status: DC | PRN
  Administered 2022-01-23 – 2022-01-26 (×2): 10 mg via ORAL
  Filled 2022-01-21 (×2): qty 1

## 2022-01-21 NOTE — Progress Notes (Signed)
RT transported pt from dialysis to 8D43 with no complications. RT and transport at bedside.

## 2022-01-21 NOTE — Progress Notes (Signed)
[facility] Progress Note  Subjective: 3.5 L off w/ HD overnight, still on bipap.   Vitals:   01/21/22 1430 01/21/22 1500 01/21/22 1507 01/21/22 1600  BP:  (!) 154/60    Pulse: (!) 101 97 96   Resp: (!) 27 (!) 21 19   Temp:    99.3 F (37.4 C)  TempSrc:    Axillary  SpO2: 97% 94% 94%   Weight:      Height:        Exam: Gen somnolent, on bipap No rash, cyanosis or gangrene Sclera anicteric No jvd or bruits Chest tight on expiration, wheezing throughout, maybe some crackles at bases RRR no MRG Abd soft ntnd no mass or ascites +bs GU normal male MS no joint effusions or deformity Ext mild pretib edema , better Neuro as above    RUA AVF+bruit    Home meds include - buspar, cardizem cd 240 qd, norco prn, combivent respimat, duoneb, synthroid, metoprolol 12.5 bid, pantoprazole, pravachol, renvela 2 ac tid, flomax, prns/ vits/ supps         OP HD: MWF [facility] 4h  400/1.5   72.5kg  3K/2.5Ca bath   Hep 3000  RUA AVF - last HD 11/24, post 74kg - mircera 75 ug q4, last 11/21 - no vdra   Assessment/ Plan: Resp distress - pulm edema by CXR +/- COPD +/- pna. IV abx per pmd. Pt missed HD yesterday, so vol excess is likely. Had HD overnight w/ 3.5 L off. CXR today perihilar edema is better, L effusion persists. Sig wheezing on exam.  ESRD - usual HD MWF. Missed HD Monday. Had HD last night here. Plan HD again today sometime as staffing allows to get back on schedule.  COPD - per pmd  HTN - cont cardizem while here.  Volume excess - as above, about 3kg up by wts today, UF goal 3 L w/ HD today MBD ckd - labs pending Anemia ckd - Hb 10.7, outpt esa not due for another 3 wks. Follow.          Rob Tawonda Legaspi 01/21/2022, 4:35 PM   Recent Labs  Lab 01/20/22 1550 01/20/22 1617 01/20/22 1623 01/21/22 0604 01/21/22 0610  HGB 10.7*   < > 11.2* 11.2* 10.7*  ALBUMIN 3.1*  --   --   --  2.8*  CALCIUM 8.8*  --   --   --  8.3*  CREATININE 7.91*  --  8.40*  --  4.97*  K  6.3*   < > 5.7* 4.4 4.5   < > = values in this interval not displayed.   No results for input(s): "IRON", "TIBC", "FERRITIN" in the last 168 hours. Inpatient medications:  budesonide (PULMICORT) nebulizer solution  0.25 mg Nebulization BID   busPIRone  5 mg Oral TID   Chlorhexidine Gluconate Cloth  6 each Topical Q0600   dextromethorphan-guaiFENesin  1 tablet Oral BID   diltiazem  240 mg Oral QHS   heparin  5,000 Units Subcutaneous Q8H   ipratropium-albuterol  3 mL Nebulization Q4H   levothyroxine  75 mcg Oral q AM   methylPREDNISolone (SOLU-MEDROL) injection  40 mg Intravenous BID   metoprolol tartrate  12.5 mg Oral BID   mouth rinse  15 mL Mouth Rinse 4 times per day   pantoprazole  40 mg Oral q AM   polyethylene glycol  17 g Oral Daily   pravastatin  40 mg Oral QHS   senna  8.6 mg Oral BID  azithromycin Stopped (01/21/22 1010)   cefTRIAXone (ROCEPHIN)  IV Stopped (01/21/22 0255)   acetaminophen, hydrALAZINE, hydrOXYzine, ipratropium-albuterol

## 2022-01-21 NOTE — Progress Notes (Signed)
RT attempted pt off BIPAP on 4L [place]. Pt did not tolerate for long. Pt became very SOB and sats dropped to 85%. Pt placed back on BIPAP and FIO2 was increased to 50% and IPAP increased to 10. Pt sats are now 95% and WOB WNL.

## 2022-01-21 NOTE — Progress Notes (Signed)
   01/21/22 2021  Pain Assessment  Pain Scale 0-10  Pain Score 0  Neurological  Level of Consciousness Responds to Voice  Orientation Level Oriented to person  Respiratory  Respiratory Pattern Labored  Chest Assessment Chest expansion symmetrical  Bilateral Breath Sounds Fine crackles  R Upper  Breath Sounds Expiratory wheezes  L Upper Breath Sounds Expiratory wheezes  R Lower Breath Sounds Fine crackles  L Lower Breath Sounds Fine crackles  Cardiac  Pulse Irregular  Heart Sounds S1, S2  Cardiac Rhythm ST  Ectopy PAC   Received patient in bed to unit.  Alert and oriented.  Informed consent signed and in chart.   Treatment initiated: 7741S Treatment completed: 2021p  Patient tolerated well.  Transported back to the room  Alert, without acute distress.  Hand-off given to patient's nurse.   Access used: Yes Access issues: No  Total UF removed: 2769m Medication(s) given: Hydralazine'10mg'$ , Ativan '1mg'$  Post HD VS: 98.4 (Axillary), 186/87, 121, 31, O2 on bipap 98% Post HD weight: None   [facility]

## 2022-01-21 NOTE — Progress Notes (Signed)
2200 meds held r/t patient being on BiPAP. eLink MD suggested to hold all PO meds. Metoprolol switched to IV route.

## 2022-01-21 NOTE — Progress Notes (Signed)
[place] Progress Note Patient Name: DORIN STOOKSBURY DOB: [birth year] MRN: [id number]   Date of Service  01/21/2022  HPI/Events of Note  87/M with history of hypertension, afib, COPD, ESRD, presenting with shortness of breath. Evaluation in the ED revealed pt to be hypoxemic, with CXR showing increased L pleural effusion, along with pulmonary edema. HE was placed on BIPAP, and admitted to the ICU for further management.   eICU Interventions  - Admit to ICU - Maintain on BIPAP support.  - Follow serial ABG, will adjust BIPAP settings as appropriate - Planned to resume HD to address pulmonary edema - Continue bronchodilators for underlying COPD.  - Started on empiric antibioitcs - will follow cultures and deescalate as indicated.  - DVT prophylaxis- heparin [place]        Musab Wingard M DELA CRUZ 01/21/2022, 2:09 AM

## 2022-01-21 NOTE — Progress Notes (Signed)
Per. MD Roney Jaffe to bring Patient to Hemodialysis Unit.

## 2022-01-21 NOTE — Progress Notes (Signed)
NAME:  Wayne Meadows, MRN:  [id number], DOB:  07-Sep-1934, LOS: 1 ADMISSION DATE:  01/20/2022, CONSULTATION DATE:  11/28 REFERRING MD:  Dr. Francia Meadows, CHIEF COMPLAINT:  acute respiratory failure w/ hypoxia   History of Present Illness:  Wayne Meadows is a 87 y.o. M who resides at [facility] and has PMH as outlined below including but not limited to ESRD on HD MWF (last Friday 11/24, missed session Monday 11/27), prior CVA, HTN, A-fib, COPD, SBO, COVID. He was brought to [facility] 11/28 for SOB and cough x 1 week.  He missed his dialysis session on 11/27 and began to feel worse after this.  On EMS arrival to the facility they attempted to place him on BiPAP which he promptly removed.  He was given 40 mg of Solu-Medrol to the ED.  In ED, he was placed on BiPAP after he had hypoxia and increased work of breathing.  His work of breathing improved on BiPAP.  He was also noted to be hypertensive with BP 180/77, repeats improved with most recent 130/57. CXR showed pulmonary edema with unchanged L pleural effusion and bibasilar atelectasis.  Nephrology was consulted for HD and due to his ongoing BiPAP requirements, PCCM asked to evaluate for ICU admission.   Pertinent  Medical History   Past Medical History:  Diagnosis Date   Abnormal echocardiogram 03/08/2021   Abnormal finding on GI tract imaging    Acute blood loss anemia    Acute pancreatitis 08/12/2020   Arteriovenous fistula infection ([place]) 04/18/2021   Arthritis    Carotid stenosis    Community acquired pneumonia 04/06/2021   COPD (chronic obstructive pulmonary disease) (HCC)    Coronary artery disease    S/p PCI 2011;  NSTEMI 12/12:  LHC/PCI 02/23/11: LAD 60% after the septal perforator, D1 occluded with distal collaterals, proximal RI 30-40%, AV circumflex stent patent with 60% stenosis after the stent, RCA 99%, EF 60-65%.  His RCA was treated with a bare-metal stent   CVA (cerebral infarction) 2011   Right cerebral; total obstruction of  the right ICA   Diverticulitis    Hypertension    Paroxysmal atrial fibrillation with RVR ([place]) 04/09/2021   Partial small bowel obstruction ([place]) 08/17/2021   Pleuritic chest pain 04/08/2014   Pressure injury of skin 03/30/2021   Rash 03/05/2021   Rectal bleeding 07/2015   Refractory nausea and vomiting 06/20/2020   Renal carcinoma ([place])    Sepsis, unspecified organism ([place]) 04/11/2016   Stroke ([place])    Tobacco abuse, in remission    Unstable angina ([place]) 03/18/2011     Significant Hospital Events: Including procedures, antibiotic start and stop dates in addition to other pertinent events   11/28: missed dialysis yesterday; admitted Wayne Meadows SOB; on bipap; received dialysis  Interim History / Subjective:  Dialysis last night  Patient more alert today Attempted to place on [place] and give breathing treatment. Patient quickly became SOB and desated in 80s. Audibly wheezing. Placed back on bipap with instant relief.  Objective   Blood pressure (!) 161/81, pulse (!) 115, temperature (!) 96.6 F (35.9 C), temperature source Axillary, resp. rate 17, height 6' (1.829 m), weight 75.7 kg, SpO2 93 %.    FiO2 (%):  [40 %-50 %] 50 %   Intake/Output Summary (Last 24 hours) at 01/21/2022 0830 Last data filed at 01/21/2022 0700 Gross per 24 hour  Intake 175.22 ml  Output 3500 ml  Net -3324.78 ml    Filed Weights   01/20/22 1402 01/21/22  0500  Weight: 72 kg 75.7 kg    Examination: General:  ill appearing on mech vent HEENT: MM pink/moist; bipap in place Neuro: sleepy but more alert today; follows commands; PERRL CV: s1s2, no m/r/g PULM:  dim clear BS bilaterally; bipap 10/5 50% sats 99% GI: distended; diminished BS Extremities: warm/dry, ble edema; RUE fistula good thrill Skin: no rashes or lesions appreciated   Assessment & Plan:   Acute Hypoxic respiratory failure with increased work of breathing - 2/2 volume overload in setting of missed dialysis; less likely COPD exacerbation; possible  HCAP. Pulmonary Edema Chronic/unchanged L pleural effusion. Bibasilar atelectasis. P: - continue BiPAP; wean for sats >92% - may attempt to place on [place] as tolerated for breaks off bipap - will talk with nephro for possible HD volume removal again today - IV lasix 120 - duoneb scheduled; will add pulmicort -consider IV steroids if still having difficulty weaning off bipap - Empiric Ceftriaxone/azithro for AECOPD - pulm toiletry when able to come off bipap  ESRD on HD MWF - last session Friday 1/24, missed a session on Monday 11/27. Hyponatremia, Hyperkalemia - 2/2 above. Hypocalcemia. P: - Nephrology consulted, appreciate the assistance. - HD per nephro - Trend BMP - Replace electrolytes as indicated - Avoid nephrotoxic agents, ensure adequate renal perfusion  Acute encephalopathy: likely ativan related P: -improved this am -limit sedating meds -if sedation needed for bipap use consider low dose precedex   Distended Abdomen P: -unable to obtain KUB due to inability of laying flat -continue home bowel regimen -continue to monitor and watch for BM  Hx HTN, PAF, CAD.: not on AC at home P: - Volume removal as above - telemetry monitoring - resume home PO Cardizem, Lopressor, Pravastatin. - prn hydralazine for htn  Hx Hypothyroidism. Elevated TSH P: - check t3/t4 - resume home Synthroid  Hx CVA. - supportive care  Hx of anxiety P: -continue buspar and prn hydroxyzine  GERD P: -PPI  Best Practice (right click and "Reselect all SmartList Selections" daily)   Diet/type: NPO and NPO w/ oral meds DVT prophylaxis: prophylactic heparin  GI prophylaxis: PPI Lines: N/A Foley:  N/A Code Status:  full code Last date of multidisciplinary goals of care discussion [11/29 spoke with daughter Wayne Meadows and updated over phone.]  Labs   CBC: Recent Labs  Lab 01/20/22 1550 01/20/22 1617 01/20/22 1623 01/21/22 0604 01/21/22 0610  WBC 13.4*  --   --   --  10.1   NEUTROABS 12.5*  --   --   --   --   HGB 10.7* 11.2* 11.2* 11.2* 10.7*  HCT 34.4* 33.0* 33.0* 33.0* 33.4*  MCV 98.3  --   --   --  96.3  PLT 463*  --   --   --  349     Basic Metabolic Panel: Recent Labs  Lab 01/20/22 1550 01/20/22 1617 01/20/22 1623 01/21/22 0604 01/21/22 0610  NA 132* 127* 128* 132* 131*  K 6.3* 5.6* 5.7* 4.4 4.5  CL 91*  --  96*  --  91*  CO2 23  --   --   --  22  GLUCOSE 112*  --  120*  --  104*  BUN 53*  --  55*  --  29*  CREATININE 7.91*  --  8.40*  --  4.97*  CALCIUM 8.8*  --   --   --  8.3*  MG  --   --   --   --  1.9  GFR: Estimated Creatinine Clearance: 11.6 mL/min (A) (by C-G formula based on SCr of 4.97 mg/dL (H)). Recent Labs  Lab 01/20/22 1550 01/21/22 0610  WBC 13.4* 10.1     Liver Function Tests: Recent Labs  Lab 01/20/22 1550 01/21/22 0610  AST 15 17  ALT 11 12  ALKPHOS 122 113  BILITOT 2.2* 1.8*  PROT 6.9 6.6  ALBUMIN 3.1* 2.8*    No results for input(s): "LIPASE", "AMYLASE" in the last 168 hours. No results for input(s): "AMMONIA" in the last 168 hours.  ABG    Component Value Date/Time   PHART 7.334 (L) 01/21/2022 0604   PCO2ART 48.7 (H) 01/21/2022 0604   PO2ART 72 (L) 01/21/2022 0604   HCO3 25.9 01/21/2022 0604   TCO2 27 01/21/2022 0604   ACIDBASEDEF 5.0 (H) 01/20/2022 1617   O2SAT 93 01/21/2022 0604     Coagulation Profile: No results for input(s): "INR", "PROTIME" in the last 168 hours.  Cardiac Enzymes: No results for input(s): "CKTOTAL", "CKMB", "CKMBINDEX", "TROPONINI" in the last 168 hours.  HbA1C: Hgb A1c MFr Bld  Date/Time Value Ref Range Status  07/09/2020 05:00 AM 7.3 (H) 4.8 - 5.6 % Final    Comment:    (NOTE) Pre diabetes:          5.7%-6.4%  Diabetes:              >6.4%  Glycemic control for   <7.0% adults with diabetes     CBG: Recent Labs  Lab 01/20/22 1852 01/21/22 0341 01/21/22 0714  GLUCAP 176* 111* 105*    Review of Systems:   Unable to obtain while on bipap and  gets sob when off; obtained from chart review.  Past Medical History:  He,  has a past medical history of Abnormal echocardiogram (03/08/2021), Abnormal finding on GI tract imaging, Acute blood loss anemia, Acute pancreatitis (08/12/2020), Arteriovenous fistula infection ([place]) (04/18/2021), Arthritis, Carotid stenosis, Community acquired pneumonia (04/06/2021), COPD (chronic obstructive pulmonary disease) ([place]), Coronary artery disease, CVA (cerebral infarction) (2011), Diverticulitis, Hypertension, Paroxysmal atrial fibrillation with RVR ([place]) (04/09/2021), Partial small bowel obstruction ([place]) (08/17/2021), Pleuritic chest pain (04/08/2014), Pressure injury of skin (03/30/2021), Rash (03/05/2021), Rectal bleeding (07/2015), Refractory nausea and vomiting (06/20/2020), Renal carcinoma ([place]), Sepsis, unspecified organism ([place]) (04/11/2016), Stroke [facility]), Tobacco abuse, in remission, and Unstable angina ([place]) (03/18/2011).   Surgical History:   Past Surgical History:  Procedure Laterality Date   AV FISTULA PLACEMENT Right 04/03/2021   Procedure: ARTERIOVENOUS (AV) CREATION OF RIGHT ARM BRACHIOCEPHALIC FISTULA;  Surgeon: Cherre Robins, MD;  Location: [facility] OR;  Service: Vascular;  Laterality: Right;   BIOPSY  04/08/2021   Procedure: BIOPSY;  Surgeon: Lavena Bullion, DO;  Location: [place];  Service: Gastroenterology;;   BIOPSY  11/06/2021   Procedure: BIOPSY;  Surgeon: Lavena Bullion, DO;  Location: [place];  Service: Gastroenterology;;   COLON SURGERY     COLONOSCOPY N/A 08/22/2015   Procedure: COLONOSCOPY;  Surgeon: [name], MD;  Location: [place] ENDOSCOPY;  Service: Endoscopy;  Laterality: N/A;   ESOPHAGOGASTRODUODENOSCOPY N/A 07/18/2020   Procedure: ESOPHAGOGASTRODUODENOSCOPY (EGD);  Surgeon: Milus Banister, MD;  Location: [facility];  Service: Endoscopy;  Laterality: N/A;   ESOPHAGOGASTRODUODENOSCOPY (EGD) WITH PROPOFOL N/A 06/21/2020   Procedure: ESOPHAGOGASTRODUODENOSCOPY (EGD)  WITH PROPOFOL;  Surgeon: Irene Shipper, MD;  Location: [facility] ENDOSCOPY;  Service: Endoscopy;  Laterality: N/A;   ESOPHAGOGASTRODUODENOSCOPY (EGD) WITH PROPOFOL N/A 04/08/2021   Procedure: ESOPHAGOGASTRODUODENOSCOPY (EGD) WITH PROPOFOL;  Surgeon: Lavena Bullion, DO;  Location: [facility] ENDOSCOPY;  Service: Gastroenterology;  Laterality: N/A;   ESOPHAGOGASTRODUODENOSCOPY (EGD) WITH PROPOFOL N/A 11/06/2021   Procedure: ESOPHAGOGASTRODUODENOSCOPY (EGD) WITH PROPOFOL;  Surgeon: Lavena Bullion, DO;  Location: [place];  Service: Gastroenterology;  Laterality: N/A;   EUS N/A 07/18/2020   Procedure: UPPER ENDOSCOPIC ULTRASOUND (EUS) RADIAL;  Surgeon: Milus Banister, MD;  Location: [facility] ENDOSCOPY;  Service: Endoscopy;  Laterality: N/A;   FINE NEEDLE ASPIRATION N/A 07/18/2020   Procedure: FINE NEEDLE ASPIRATION (FNA) LINEAR;  Surgeon: Milus Banister, MD;  Location: [facility] ENDOSCOPY;  Service: Endoscopy;  Laterality: N/A;   HEMOSTASIS CLIP PLACEMENT  04/08/2021   Procedure: HEMOSTASIS CLIP PLACEMENT;  Surgeon: Lavena Bullion, DO;  Location: [place];  Service: Gastroenterology;;   hip relacement     INCISION AND DRAINAGE Right 09/11/2021   Procedure: INCISION AND DRAINAGE OF RIGHT ARM FISTULA;  Surgeon: Cherre Robins, MD;  Location: [facility] OR;  Service: Vascular;  Laterality: Right;   IR FLUORO GUIDE CV LINE RIGHT  03/31/2021   IR REMOVAL TUN CV CATH W/O FL  08/15/2021   IR US GUIDE VASC ACCESS RIGHT  03/31/2021   KIDNEY SURGERY     LEFT HEART CATH AND CORONARY ANGIOGRAPHY N/A 07/09/2020   Procedure: LEFT HEART CATH AND CORONARY ANGIOGRAPHY;  Surgeon: Leonie Man, MD;  Location: [place] CV LAB;  Service: Cardiovascular;  Laterality: N/A;   LEFT HEART CATHETERIZATION WITH CORONARY ANGIOGRAM N/A 02/23/2011   Procedure: LEFT HEART CATHETERIZATION WITH CORONARY ANGIOGRAM;  Surgeon: [name], MD;  Location: [facility] CATH LAB;  Service: Cardiovascular;  Laterality: N/A;   LEFT HEART CATHETERIZATION WITH  CORONARY ANGIOGRAM N/A 03/18/2011   Procedure: LEFT HEART CATHETERIZATION WITH CORONARY ANGIOGRAM;  Surgeon: [name], MD;  Location: [facility] CATH LAB;  Service: Cardiovascular;  Laterality: N/A;   PERCUTANEOUS CORONARY STENT INTERVENTION (PCI-S) N/A 02/23/2011   Procedure: PERCUTANEOUS CORONARY STENT INTERVENTION (PCI-S);  Surgeon: [name], MD;  Location: [facility] CATH LAB;  Service: Cardiovascular;  Laterality: N/A;   TEMPORARY PACEMAKER INSERTION N/A 02/23/2011   Procedure: TEMPORARY PACEMAKER INSERTION;  Surgeon: [name], MD;  Location: [facility] CATH LAB;  Service: Cardiovascular;  Laterality: N/A;   THROMBECTOMY W/ EMBOLECTOMY Right 04/03/2021   Procedure: THROMBECTOMY ARTERIOVENOUS FISTULA;  Surgeon: Cherre Robins, MD;  Location: [place];  Service: Vascular;  Laterality: Right;     Social History:   reports that he quit smoking about 14 years ago. His smoking use included cigarettes. He has never used smokeless tobacco. He reports that he does not currently use alcohol after a past usage of about 1.0 standard drink of alcohol per week. He reports that he does not use drugs.   Family History:  His family history includes Heart attack (age of onset: 14) in an other family member.   Allergies Allergies  Allergen Reactions   Bee Venom Anaphylaxis   Influenza Vaccines Other (See Comments)    "Mortally sick for 2 weeks"     Home Medications  Prior to Admission medications   Medication Sig Start Date End Date Taking? Authorizing Provider  acetaminophen (TYLENOL) 325 MG tablet Take 2 tablets (650 mg total) by mouth every 6 (six) hours as needed for mild pain (or Fever >/= 101). Patient taking differently: Take 650 mg by mouth every 6 (six) hours as needed for mild pain (fever > 101F). 04/10/21  Yes Bonnielee Haff, MD  Amino Acids-Protein Hydrolys (FEEDING SUPPLEMENT, PRO-STAT SUGAR FREE 64,) LIQD Take 30 mLs by  mouth every evening.   Yes [provider]  antiseptic oral rinse  (BIOTENE) LIQD 10 mLs by Mouth Rinse route 2 (two) times daily.   Yes [provider]  benzonatate (TESSALON) 100 MG capsule Take 1 capsule (100 mg total) by mouth 3 (three) times daily. 10/30/21  Yes Lavina Hamman, MD  busPIRone (BUSPAR) 5 MG tablet Take 1 tablet (5 mg total) by mouth 3 (three) times daily. (0800 & 2000) Patient taking differently: Take 5 mg by mouth 3 (three) times daily. 10/30/21  Yes Lavina Hamman, MD  dextromethorphan-guaiFENesin [facility] DM) 30-600 MG 12hr tablet Take 1 tablet by mouth 2 (two) times daily. 10/30/21  Yes Lavina Hamman, MD  diltiazem (CARDIZEM CD) 240 MG 24 hr capsule Take 1 capsule (240 mg total) by mouth daily. Patient taking differently: Take 240 mg by mouth at bedtime. 10/30/21  Yes Lavina Hamman, MD  HYDROcodone-acetaminophen [facility]) 10-325 MG tablet Take 1 tablet by mouth every 4 (four) hours as needed for severe pain or moderate pain. 10/30/21  Yes Lavina Hamman, MD  hydrOXYzine (ATARAX) 10 MG tablet Take 10 mg by mouth every 6 (six) hours as needed for itching.   Yes [provider]  Ipratropium-Albuterol (COMBIVENT RESPIMAT) 20-100 MCG/ACT AERS respimat Inhale 1 puff into the lungs 4 (four) times daily.   Yes [provider]  ipratropium-albuterol (DUONEB) 0.5-2.5 (3) MG/3ML SOLN Take 3 mLs by nebulization every 4 (four) hours as needed (shortness of breath, wheezing).   Yes [provider]  levothyroxine (SYNTHROID) 75 MCG tablet Take 75 mcg by mouth in the morning. (0630)   Yes [provider]  melatonin 5 MG TABS Take 5 mg by mouth at bedtime.   Yes [provider]  Menthol, Topical Analgesic, (BIOFREEZE) 4 % GEL Apply 1 application  topically 3 (three) times daily as needed (topical analgesic).   Yes [provider]  metoprolol tartrate (LOPRESSOR) 25 MG tablet Take 0.5 tablets (12.5 mg total) by mouth 2 (two) times daily. 10/30/21  Yes Lavina Hamman, MD  Naphazoline-Glycerin (CLEAR EYES  REDNESS RELIEF OP) Place 1-2 drops into the right eye 3 (three) times daily.   Yes [provider]  neomycin-bacitracin-polymyxin (NEOSPORIN) OINT Apply 1 application topically 3 (three) times daily. To right antecubital area 04/10/21  Yes Bonnielee Haff, MD  Nutritional Supplements (NOVASOURCE RENAL) LIQD Take 237 mLs by mouth 2 (two) times daily.   Yes [provider]  ondansetron (ZOFRAN) 4 MG tablet Take 4 mg by mouth every 6 (six) hours as needed for nausea or vomiting.   Yes [provider]  pantoprazole (PROTONIX) 40 MG tablet Take 40 mg by mouth in the morning.   Yes [provider]  polyethylene glycol (MIRALAX / GLYCOLAX) 17 g packet Take 17 g by mouth daily. Patient taking differently: Take 17 g by mouth in the morning. 05/01/21  Yes Sherwood Gambler, MD  pravastatin (PRAVACHOL) 40 MG tablet Take 40 mg by mouth at bedtime.   Yes [provider]  senna (SENOKOT) 8.6 MG TABS tablet Take 8.6 mg by mouth 2 (two) times daily.   Yes [provider]  sevelamer carbonate (RENVELA) 800 MG tablet Take 1,600 mg by mouth with breakfast, with lunch, and with evening meal.   Yes [provider]  sodium chloride (OCEAN) 0.65 % SOLN nasal spray Place 2 sprays into both nostrils 2 (two) times daily.   Yes [provider]  tamsulosin (FLOMAX) 0.4 MG CAPS  capsule Take 1 capsule (0.4 mg total) by mouth daily. Patient taking differently: Take 0.4 mg by mouth in the morning. 04/11/21  Yes Bonnielee Haff, MD  Darbepoetin Alfa (ARANESP) 60 MCG/0.3ML SOSY injection Inject 0.3 mLs (60 mcg total) into the vein every Monday with hemodialysis. Patient not taking: Reported on 01/20/2022 09/30/21   Cherene Altes, MD     Critical care time: 35 minutes    JD Rexene Agent [facility] 01/21/2022, 8:30 AM  Please see [email or web address] for pager details.  From 7A-7P if no response, please call [phone]. After hours, please call  ELink [phone].

## 2022-01-21 NOTE — Progress Notes (Signed)
[facility] Progress Note Patient Name: Wayne Meadows DOB: [birth year] MRN: [id number]   Date of Service  01/21/2022  HPI/Events of Note  Patient not able to take Cardizem or Metoprolol PO for AFIB.  eICU Interventions  Plan: Metoprolol 2.5 mg IV Q 3 hours PRN HR > 115.      Intervention Category Major Interventions: Arrhythmia - evaluation and management  [name] 01/21/2022, 10:30 PM

## 2022-01-21 NOTE — Significant Event (Addendum)
Rapid Response Event Note   Reason for Call :  Respiratory distress.  Per HD RN, pt arrival to HD unit on bipap and in no distress. Pt became hypertensive(SBP-200s) during HD tx. This was treated with '20mg'$  hydralazine(at 1927) and SBP decreased to 170s. Pt's breathing then became labored with accessory muscle use. RT was called and pt was given a duoneb and bipap 10/5 was increased to 12/8. RRT called by RT to assist.   Pt is an ICU pt who is being dialyzed in the HD unit.  Initial Focused Assessment:  Pt lying in bed with eyes open. He is in resp distress, +WOB, +accessory muscle use. He is c/o SOB, denies CP. Lungs with wheezing t/o, decreased in LLL. Skin warm and dry.  HR-122, SBP-170s, RR-45, SpO2-98% on .50 bipap.   2.7L taken off during HD tx.   Interventions:  Duoneb PTA RRT ABG-7.39/49/113/29.7 Albuterol tx Ativan '1mg'$  IV x 1 Precedex gtt(will start in ICU) PCXR(will get in ICU) Plan of Care:  ABG ok. '1mg'$  ativan given for anxiety. Pt sleepy after ativan administration but still tachypneic and c/o SOB. Plan to start precedex gtt once back in ICU. Plan of care discussed with Janann Colonel ICU RN.   Event Summary:   MD Notified: Dr. Duwayne Heck at bedside on my arrival.  Call [place], Mayumi Summerson Anderson, RN

## 2022-01-21 NOTE — Progress Notes (Signed)
RT transported pt from dialysis to 4N 23 without complications.

## 2022-01-21 NOTE — Progress Notes (Signed)
  Echocardiogram 2D Echocardiogram has been performed.  Wayne Meadows 01/21/2022, 9:40 AM

## 2022-01-21 NOTE — Progress Notes (Signed)
Pt. Got Hydralazine '10mg'$  for increase SBP, Ativan '1mg'$  per Dr.Nicole Gonazels, respiratory to do ABG's. Pt. Continue to complaint of cannot breath, Dr Geralynn Ochs ask RN to take patient off the machine, and the patient had 2 mins left before treatment ended.

## 2022-01-22 DIAGNOSIS — J9601 Acute respiratory failure with hypoxia: Secondary | ICD-10-CM | POA: Diagnosis not present

## 2022-01-22 LAB — CBC
HCT: 32.5 % — ABNORMAL LOW (ref 39.0–52.0)
Hemoglobin: 10.1 g/dL — ABNORMAL LOW (ref 13.0–17.0)
MCH: 30 pg (ref 26.0–34.0)
MCHC: 31.1 g/dL (ref 30.0–36.0)
MCV: 96.4 fL (ref 80.0–100.0)
Platelets: 256 10*3/uL (ref 150–400)
RBC: 3.37 MIL/uL — ABNORMAL LOW (ref 4.22–5.81)
RDW: 15.4 % (ref 11.5–15.5)
WBC: 10.6 10*3/uL — ABNORMAL HIGH (ref 4.0–10.5)
nRBC: 0 % (ref 0.0–0.2)

## 2022-01-22 LAB — BASIC METABOLIC PANEL
Anion gap: 18 — ABNORMAL HIGH (ref 5–15)
BUN: 29 mg/dL — ABNORMAL HIGH (ref 8–23)
CO2: 26 mmol/L (ref 22–32)
Calcium: 8.8 mg/dL — ABNORMAL LOW (ref 8.9–10.3)
Chloride: 93 mmol/L — ABNORMAL LOW (ref 98–111)
Creatinine, Ser: 4.09 mg/dL — ABNORMAL HIGH (ref 0.61–1.24)
GFR, Estimated: 14 mL/min — ABNORMAL LOW (ref >60)
Glucose, Bld: 115 mg/dL — ABNORMAL HIGH (ref 70–99)
Potassium: 4.1 mmol/L (ref 3.5–5.1)
Sodium: 137 mmol/L (ref 135–145)

## 2022-01-22 LAB — MAGNESIUM: Magnesium: 1.9 mg/dL (ref 1.7–2.4)

## 2022-01-22 LAB — GLUCOSE, CAPILLARY
Glucose-Capillary: 115 mg/dL — ABNORMAL HIGH (ref 70–99)
Glucose-Capillary: 117 mg/dL — ABNORMAL HIGH (ref 70–99)
Glucose-Capillary: 71 mg/dL (ref 70–99)
Glucose-Capillary: 93 mg/dL (ref 70–99)
Glucose-Capillary: 98 mg/dL (ref 70–99)

## 2022-01-22 LAB — HEPATITIS B SURFACE ANTIBODY, QUANTITATIVE: Hep B S AB Quant (Post): <3.1 m[IU]/mL — ABNORMAL LOW (ref >9.9)

## 2022-01-22 LAB — HEPATITIS B E ANTIBODY: Hep B E Ab: NEGATIVE

## 2022-01-22 LAB — T3, FREE: T3, Free: 1.5 pg/mL — ABNORMAL LOW (ref 2.0–4.4)

## 2022-01-22 MED ORDER — FUROSEMIDE 10 MG/ML IJ SOLN
40.0000 mg | Freq: Once | INTRAMUSCULAR | Status: AC
  Administered 2022-01-22: 40 mg via INTRAVENOUS
  Filled 2022-01-22: qty 4

## 2022-01-22 MED ORDER — HEPARIN SODIUM (PORCINE) 1000 UNIT/ML DIALYSIS
3000.0000 [IU] | INTRAMUSCULAR | Status: AC | PRN
  Administered 2022-01-23: 3000 [IU] via INTRAVENOUS_CENTRAL
  Filled 2022-01-22: qty 3

## 2022-01-22 MED ORDER — PREDNISONE 20 MG PO TABS
40.0000 mg | ORAL_TABLET | Freq: Every day | ORAL | Status: DC
  Administered 2022-01-23: 40 mg via ORAL
  Filled 2022-01-22: qty 2

## 2022-01-22 MED ORDER — CHLORHEXIDINE GLUCONATE CLOTH 2 % EX PADS
6.0000 | MEDICATED_PAD | Freq: Every day | CUTANEOUS | Status: DC
  Administered 2022-01-24: 6 via TOPICAL

## 2022-01-22 MED ORDER — REVEFENACIN 175 MCG/3ML IN SOLN
175.0000 ug | Freq: Every day | RESPIRATORY_TRACT | Status: DC
  Administered 2022-01-23: 175 ug via RESPIRATORY_TRACT
  Filled 2022-01-22: qty 3

## 2022-01-22 NOTE — Progress Notes (Signed)
Pt receives out-pt HD at [facility] on MWF 12:35 chair time. Will assist as needed.   Melven Sartorius Renal Navigator [phone]

## 2022-01-22 NOTE — Progress Notes (Signed)
NAME:  Wayne Meadows, MRN:  [id number], DOB:  1934-08-19, LOS: 2 ADMISSION DATE:  01/20/2022, CONSULTATION DATE:  11/28 REFERRING MD:  Dr. Francia Greaves, CHIEF COMPLAINT:  acute respiratory failure w/ hypoxia   History of Present Illness:  Wayne Meadows is a 87 y.o. M who resides at [facility] and has PMH as outlined below including but not limited to ESRD on HD MWF (last Friday 11/24, missed session Monday 11/27), prior CVA, HTN, A-fib, COPD, SBO, COVID. He was brought to [facility] 11/28 for SOB and cough x 1 week.  He missed his dialysis session on 11/27 and began to feel worse after this.  On EMS arrival to the facility they attempted to place him on BiPAP which he promptly removed.  He was given 40 mg of Solu-Medrol to the ED.  In ED, he was placed on BiPAP after he had hypoxia and increased work of breathing.  His work of breathing improved on BiPAP.  He was also noted to be hypertensive with BP 180/77, repeats improved with most recent 130/57. CXR showed pulmonary edema with unchanged L pleural effusion and bibasilar atelectasis.  Nephrology was consulted for HD and due to his ongoing BiPAP requirements, PCCM asked to evaluate for ICU admission.   Pertinent  Medical History   Past Medical History:  Diagnosis Date   Abnormal echocardiogram 03/08/2021   Abnormal finding on GI tract imaging    Acute blood loss anemia    Acute pancreatitis 08/12/2020   Arteriovenous fistula infection ([place]) 04/18/2021   Arthritis    Carotid stenosis    Community acquired pneumonia 04/06/2021   COPD (chronic obstructive pulmonary disease) (HCC)    Coronary artery disease    S/p PCI 2011;  NSTEMI 12/12:  LHC/PCI 02/23/11: LAD 60% after the septal perforator, D1 occluded with distal collaterals, proximal RI 30-40%, AV circumflex stent patent with 60% stenosis after the stent, RCA 99%, EF 60-65%.  His RCA was treated with a bare-metal stent   CVA (cerebral infarction) 2011   Right cerebral; total obstruction of  the right ICA   Diverticulitis    Hypertension    Paroxysmal atrial fibrillation with RVR ([place]) 04/09/2021   Partial small bowel obstruction ([place]) 08/17/2021   Pleuritic chest pain 04/08/2014   Pressure injury of skin 03/30/2021   Rash 03/05/2021   Rectal bleeding 07/2015   Refractory nausea and vomiting 06/20/2020   Renal carcinoma ([place])    Sepsis, unspecified organism ([place]) 04/11/2016   Stroke ([place])    Tobacco abuse, in remission    Unstable angina ([place]) 03/18/2011     Significant Hospital Events: Including procedures, antibiotic start and stop dates in addition to other pertinent events   11/28: missed dialysis yesterday; admitted [facility] SOB; on bipap; received dialysis 11/30 transfer out of ICU   Interim History / Subjective:  Off BiPAP   Wants a warm blanket  Objective   Blood pressure (!) 158/72, pulse 92, temperature 98.2 F (36.8 C), temperature source Oral, resp. rate 17, height 6' (1.829 m), weight 72.9 kg, SpO2 92 %.    FiO2 (%):  [40 %-50 %] 40 %   Intake/Output Summary (Last 24 hours) at 01/22/2022 1332 Last data filed at 01/22/2022 1200 Gross per 24 hour  Intake 513.4 ml  Output 2700 ml  Net -2186.6 ml   Filed Weights   01/20/22 1402 01/21/22 0500 01/22/22 0500  Weight: 72 kg 75.7 kg 72.9 kg    Examination: General: frail elderly M NAD  HEENT: NCAT  pink mm  Neuro:AAOx3 CV: rr s1s2 no rgm PULM:  symmetrical chest expansion, L wheeze  GI: soft hypoactive  Extremities: no acute deformity  Skin: clean dry warm    Assessment & Plan:   Acute hypoxic respiratory failure Pulmonary edema Chronic L pleural effusion Atelectasis AECOPD  P: - wean O2, PRN BiPAP -change solumedrol to PO pred  - Empiric Ceftriaxone/azithro for AECOPD - duoneb, pulmicort  -pulm hygiene  ESRD  P: - Hd per nephro   Abdominal distention  P: -continue home bowel regimen -continue to monitor and watch for BM  Hx HTN pAF not on AC CAD P: -cardizem, lopressor, statin  -  prn hydralazine for htn  Hypothyroidism. P: -  Synthroid  Hx CVA Hx of anxiety P: -continue buspar and prn hydroxyzine -supportive care   GERD P: -PPI  Best Practice (right click and "Reselect all SmartList Selections" daily)   Diet/type: Regular consistency (see orders) DVT prophylaxis: prophylactic heparin  GI prophylaxis: PPI Lines: N/A Foley:  N/A Code Status:  full code Last date of multidisciplinary goals of care discussion [11/29 spoke with daughter [name] and updated over phone.]  Trasnfer out of ICU 11/30   Labs   CBC: Recent Labs  Lab 01/20/22 1550 01/20/22 1617 01/20/22 1623 01/21/22 0604 01/21/22 0610 01/22/22 0550  WBC 13.4*  --   --   --  10.1 10.6*  NEUTROABS 12.5*  --   --   --   --   --   HGB 10.7* 11.2* 11.2* 11.2* 10.7* 10.1*  HCT 34.4* 33.0* 33.0* 33.0* 33.4* 32.5*  MCV 98.3  --   --   --  96.3 96.4  PLT 463*  --   --   --  349 361    Basic Metabolic Panel: Recent Labs  Lab 01/20/22 1550 01/20/22 1617 01/20/22 1623 01/21/22 0604 01/21/22 0610 01/22/22 0550  NA 132* 127* 128* 132* 131* 137  K 6.3* 5.6* 5.7* 4.4 4.5 4.1  CL 91*  --  96*  --  91* 93*  CO2 23  --   --   --  22 26  GLUCOSE 112*  --  120*  --  104* 115*  BUN 53*  --  55*  --  29* 29*  CREATININE 7.91*  --  8.40*  --  4.97* 4.09*  CALCIUM 8.8*  --   --   --  8.3* 8.8*  MG  --   --   --   --  1.9 1.9   GFR: Estimated Creatinine Clearance: 13.6 mL/min (A) (by C-G formula based on SCr of 4.09 mg/dL (H)). Recent Labs  Lab 01/20/22 1550 01/21/22 0610 01/22/22 0550  WBC 13.4* 10.1 10.6*    Liver Function Tests: Recent Labs  Lab 01/20/22 1550 01/21/22 0610  AST 15 17  ALT 11 12  ALKPHOS 122 113  BILITOT 2.2* 1.8*  PROT 6.9 6.6  ALBUMIN 3.1* 2.8*   No results for input(s): "LIPASE", "AMYLASE" in the last 168 hours. No results for input(s): "AMMONIA" in the last 168 hours.  ABG    Component Value Date/Time   PHART 7.39 01/21/2022 2014   PCO2ART 49 (H)  01/21/2022 2014   PO2ART 113 (H) 01/21/2022 2014   HCO3 29.7 (H) 01/21/2022 2014   TCO2 27 01/21/2022 0604   ACIDBASEDEF 5.0 (H) 01/20/2022 1617   O2SAT 99.2 01/21/2022 2014     Coagulation Profile: No results for input(s): "INR", "PROTIME" in the last 168 hours.  Cardiac Enzymes:  No results for input(s): "CKTOTAL", "CKMB", "CKMBINDEX", "TROPONINI" in the last 168 hours.  HbA1C: Hgb A1c MFr Bld  Date/Time Value Ref Range Status  07/09/2020 05:00 AM 7.3 (H) 4.8 - 5.6 % Final    Comment:    (NOTE) Pre diabetes:          5.7%-6.4%  Diabetes:              >6.4%  Glycemic control for   <7.0% adults with diabetes     CBG: Recent Labs  Lab 01/21/22 2128 01/21/22 2339 01/22/22 0340 01/22/22 0724 01/22/22 [place] MSN, AGACNP-BC [place] for pager 01/22/2022, 1:32 PM

## 2022-01-22 NOTE — Progress Notes (Signed)
Per staff patient has tolerated being on [place] most of the day.  After transferring to PCU he had acute respiratory distress.  Staff gave his respiratory treatment and RT placed patient on Bipap.  After a short time on bipap he began to improve.  Will continue bipap per RT as patient tolerates.  RN to call if patient has addition respiratory distress.   BP 141/71  HR 130  RR 17-24 O2 sat 96% on Bipap  Axillary temp 97.9   As patient began to improve HR 90s  RR 20-22 but less labored

## 2022-01-22 NOTE — Progress Notes (Signed)
This RN spoke with [facility] RN regarding pt being unable to tolerate being off bipap long enough to safely take PO medications.  Attempted to take pt off bipap earlier to eat, but pt short of breath with talking and asked to be placed back on bipap.  Pt has PRN medications to cover missing doses of scheduled PO medications, and per [facility] RN this should be fine.  If pt able to tolerate being off bipap later in shift, will attempt to administer POs at that time.  Pt resting comfortably on bipap at this time.

## 2022-01-22 NOTE — Progress Notes (Signed)
RT called to bedside as pt in respirator distress. Pt placed on bipap 15/8 R 8/ 50%. Audible Coarse Crackles/Rales Heard, Retractions, distress noted, pt diaphoretic. Rapid Response RN notified.

## 2022-01-22 NOTE — Progress Notes (Signed)
  Transition of Care [facility]) Screening Note   Patient Details  Name: Wayne Meadows Date of Birth: 1934/11/03   Transition [facility] Care [facility]) CM/SW Contact:    Benard Halsted, LCSW Phone Number: 01/22/2022, 4:56 PM    Transition of Care Department [facility]) has reviewed patient from [facility] long term care. We will continue to monitor patient advancement through interdisciplinary progression rounds. If new patient transition needs arise, please place a TOC consult.

## 2022-01-22 NOTE — Progress Notes (Signed)
[facility] Progress Note  Subjective: 2.5 L overnight UF, down to dry wt. Off bipap.   Vitals:   01/22/22 0815 01/22/22 0851 01/22/22 0900 01/22/22 1000  BP:  (!) 161/66 (!) 153/57 139/61  Pulse:  84 88 94  Resp:  '16 20 19  '$ Temp:      TempSrc:      SpO2: 99%  94% 94%  Weight:      Height:        Exam: Gen on [place] O2, lethargic, no ^wob No jvd or bruits Chest better air movement, min wheezing, no rales RRR no MRG Abd soft ntnd no mass or ascites +bs Ext no edema Neuro as above    RUA AVF+bruit    Home meds include - buspar, cardizem cd 240 qd, norco prn, combivent respimat, duoneb, synthroid, metoprolol 12.5 bid, pantoprazole, pravachol, renvela 2 ac tid, flomax, prns/ vits/ supps         OP HD: MWF [facility] 4h  400/1.5   72.5kg  3K/2.5Ca bath   Hep 3000  RUA AVF - last HD 11/24, post 74kg - mircera 75 ug q4, last 11/21 - no vdra   Assessment/ Plan: Resp distress - pulm edema by CXR +/- COPD +/- pna. IV abx per pmd. Pt missed HD x 1. Vol excess per initial CXR. Had HD last 2 days for 3.5L and 2.5L respectively. Off bipap now. Wheezing resolved as well.  ESRD - usual HD MWF. Missed HD x 1. Had HD here tues and wed. HD tomorrow.  COPD - per pmd  HTN - on cardizem cd at home.  Volume excess - resolved, at dry wt. BP's high still so may need to lower his dry wt.  MBD ckd - labs pending Anemia ckd - Hb 10.7, outpt esa not due for another 3 wks. Follow.          Rob Renu Asby 01/22/2022, 12:14 PM   Recent Labs  Lab 01/20/22 1550 01/20/22 1617 01/21/22 0610 01/22/22 0550  HGB 10.7*   < > 10.7* 10.1*  ALBUMIN 3.1*  --  2.8*  --   CALCIUM 8.8*  --  8.3* 8.8*  CREATININE 7.91*   < > 4.97* 4.09*  K 6.3*   < > 4.5 4.1   < > = values in this interval not displayed.    No results for input(s): "IRON", "TIBC", "FERRITIN" in the last 168 hours. Inpatient medications:  budesonide (PULMICORT) nebulizer solution  0.25 mg Nebulization BID   busPIRone  5 mg  Oral TID   Chlorhexidine Gluconate Cloth  6 each Topical Q0600   dextromethorphan-guaiFENesin  1 tablet Oral BID   diltiazem  240 mg Oral QHS   heparin  5,000 Units Subcutaneous Q8H   hydrALAZINE  20 mg Intravenous Once   ipratropium-albuterol  3 mL Nebulization Q4H   levothyroxine  75 mcg Oral q AM   methylPREDNISolone (SOLU-MEDROL) injection  40 mg Intravenous BID   metoprolol tartrate  12.5 mg Oral BID   mouth rinse  15 mL Mouth Rinse 4 times per day   pantoprazole  40 mg Oral q AM   polyethylene glycol  17 g Oral Daily   pravastatin  40 mg Oral QHS   senna  8.6 mg Oral BID    azithromycin 250 mL/hr at 01/22/22 1000   cefTRIAXone (ROCEPHIN)  IV Stopped (01/21/22 2217)   acetaminophen, heparin, hydrALAZINE, hydrOXYzine, ipratropium-albuterol, LORazepam, metoprolol tartrate

## 2022-01-22 NOTE — Progress Notes (Signed)
[place] to RN to place floor mats both sides of bed per safety plan

## 2022-01-23 ENCOUNTER — Inpatient Hospital Stay (HOSPITAL_COMMUNITY): Payer: No Typology Code available for payment source

## 2022-01-23 DIAGNOSIS — Z515 Encounter for palliative care: Secondary | ICD-10-CM

## 2022-01-23 DIAGNOSIS — J441 Chronic obstructive pulmonary disease with (acute) exacerbation: Secondary | ICD-10-CM | POA: Diagnosis not present

## 2022-01-23 DIAGNOSIS — N186 End stage renal disease: Secondary | ICD-10-CM | POA: Diagnosis not present

## 2022-01-23 DIAGNOSIS — J9601 Acute respiratory failure with hypoxia: Secondary | ICD-10-CM | POA: Diagnosis not present

## 2022-01-23 LAB — CBC
HCT: 30.9 % — ABNORMAL LOW (ref 39.0–52.0)
HCT: 33.7 % — ABNORMAL LOW (ref 39.0–52.0)
Hemoglobin: 10.1 g/dL — ABNORMAL LOW (ref 13.0–17.0)
Hemoglobin: 10.4 g/dL — ABNORMAL LOW (ref 13.0–17.0)
MCH: 30.7 pg (ref 26.0–34.0)
MCH: 29.9 pg (ref 26.0–34.0)
MCHC: 32.7 g/dL (ref 30.0–36.0)
MCHC: 30.9 g/dL (ref 30.0–36.0)
MCV: 93.9 fL (ref 80.0–100.0)
MCV: 96.8 fL (ref 80.0–100.0)
Platelets: 279 10*3/uL (ref 150–400)
Platelets: 301 10*3/uL (ref 150–400)
RBC: 3.29 MIL/uL — ABNORMAL LOW (ref 4.22–5.81)
RBC: 3.48 MIL/uL — ABNORMAL LOW (ref 4.22–5.81)
RDW: 15.5 % (ref 11.5–15.5)
RDW: 15.6 % — ABNORMAL HIGH (ref 11.5–15.5)
WBC: 8.7 10*3/uL (ref 4.0–10.5)
WBC: 9.9 10*3/uL (ref 4.0–10.5)
nRBC: 0 % (ref 0.0–0.2)
nRBC: 0 % (ref 0.0–0.2)

## 2022-01-23 LAB — RENAL FUNCTION PANEL
Albumin: 2.6 g/dL — ABNORMAL LOW (ref 3.5–5.0)
Anion gap: 16 — ABNORMAL HIGH (ref 5–15)
BUN: 59 mg/dL — ABNORMAL HIGH (ref 8–23)
CO2: 23 mmol/L (ref 22–32)
Calcium: 8.4 mg/dL — ABNORMAL LOW (ref 8.9–10.3)
Chloride: 98 mmol/L (ref 98–111)
Creatinine, Ser: 6.22 mg/dL — ABNORMAL HIGH (ref 0.61–1.24)
GFR, Estimated: 8 mL/min — ABNORMAL LOW (ref >60)
Glucose, Bld: 86 mg/dL (ref 70–99)
Phosphorus: 6.8 mg/dL — ABNORMAL HIGH (ref 2.5–4.6)
Potassium: 4.4 mmol/L (ref 3.5–5.1)
Sodium: 137 mmol/L (ref 135–145)

## 2022-01-23 LAB — GLUCOSE, CAPILLARY
Glucose-Capillary: 111 mg/dL — ABNORMAL HIGH (ref 70–99)
Glucose-Capillary: 64 mg/dL — ABNORMAL LOW (ref 70–99)
Glucose-Capillary: 66 mg/dL — ABNORMAL LOW (ref 70–99)
Glucose-Capillary: 85 mg/dL (ref 70–99)
Glucose-Capillary: 89 mg/dL (ref 70–99)
Glucose-Capillary: 94 mg/dL (ref 70–99)

## 2022-01-23 LAB — CREATININE, SERUM
Creatinine, Ser: 6.12 mg/dL — ABNORMAL HIGH (ref 0.61–1.24)
GFR, Estimated: 8 mL/min — ABNORMAL LOW (ref >60)

## 2022-01-23 MED ORDER — LEVALBUTEROL HCL 0.63 MG/3ML IN NEBU
0.6300 mg | INHALATION_SOLUTION | Freq: Four times a day (QID) | RESPIRATORY_TRACT | Status: DC
  Administered 2022-01-23 – 2022-01-26 (×11): 0.63 mg via RESPIRATORY_TRACT
  Filled 2022-01-23 (×11): qty 3

## 2022-01-23 MED ORDER — HEPARIN SODIUM (PORCINE) 1000 UNIT/ML DIALYSIS: 3000.0000 [IU] | Freq: Once | INTRAMUSCULAR | Status: DC

## 2022-01-23 MED ORDER — HEPARIN SODIUM (PORCINE) 5000 UNIT/ML IJ SOLN: 5000.0000 [IU] | Freq: Three times a day (TID) | INTRAMUSCULAR | Status: DC

## 2022-01-23 MED ORDER — IPRATROPIUM-ALBUTEROL 0.5-2.5 (3) MG/3ML IN SOLN: 3.0000 mL | Freq: Four times a day (QID) | RESPIRATORY_TRACT | Status: DC

## 2022-01-23 MED ORDER — SODIUM CHLORIDE 0.9 % IV SOLN
1.0000 g | INTRAVENOUS | Status: DC
  Administered 2022-01-23 – 2022-01-27 (×4): 1 g via INTRAVENOUS
  Filled 2022-01-23 (×6): qty 10

## 2022-01-23 MED ORDER — VANCOMYCIN HCL 1250 MG/250ML IV SOLN
1250.0000 mg | Freq: Once | INTRAVENOUS | Status: AC
  Administered 2022-01-23: 1250 mg via INTRAVENOUS
  Filled 2022-01-23: qty 250

## 2022-01-23 MED ORDER — DILTIAZEM LOAD VIA INFUSION
20.0000 mg | Freq: Once | INTRAVENOUS | Status: DC
  Filled 2022-01-23: qty 20

## 2022-01-23 MED ORDER — METOPROLOL TARTRATE 5 MG/5ML IV SOLN
5.0000 mg | INTRAVENOUS | Status: DC | PRN
  Administered 2022-01-23: 5 mg via INTRAVENOUS
  Filled 2022-01-23: qty 5

## 2022-01-23 MED ORDER — METHYLPREDNISOLONE SODIUM SUCC 40 MG IJ SOLR
40.0000 mg | Freq: Two times a day (BID) | INTRAMUSCULAR | Status: DC
  Administered 2022-01-23 – 2022-01-25 (×5): 40 mg via INTRAVENOUS
  Filled 2022-01-23 (×5): qty 1

## 2022-01-23 MED ORDER — DEXTROSE 50 % IV SOLN
12.5000 g | INTRAVENOUS | Status: AC
  Administered 2022-01-23: 12.5 g via INTRAVENOUS
  Filled 2022-01-23: qty 50

## 2022-01-23 MED ORDER — PANTOPRAZOLE SODIUM 40 MG IV SOLR
40.0000 mg | Freq: Two times a day (BID) | INTRAVENOUS | Status: DC
  Administered 2022-01-23 – 2022-01-26 (×6): 40 mg via INTRAVENOUS
  Filled 2022-01-23 (×6): qty 10

## 2022-01-23 MED ORDER — IPRATROPIUM BROMIDE 0.02 % IN SOLN
0.5000 mg | Freq: Four times a day (QID) | RESPIRATORY_TRACT | Status: DC
  Administered 2022-01-23 – 2022-01-26 (×11): 0.5 mg via RESPIRATORY_TRACT
  Filled 2022-01-23 (×11): qty 2.5

## 2022-01-23 MED ORDER — ALBUTEROL SULFATE (2.5 MG/3ML) 0.083% IN NEBU: 2.5000 mg | INHALATION_SOLUTION | RESPIRATORY_TRACT | Status: DC | PRN

## 2022-01-23 MED ORDER — DILTIAZEM HCL 60 MG PO TABS
60.0000 mg | ORAL_TABLET | Freq: Four times a day (QID) | ORAL | Status: DC
  Administered 2022-01-23 – 2022-01-27 (×13): 60 mg via ORAL
  Filled 2022-01-23 (×15): qty 1

## 2022-01-23 MED ORDER — DILTIAZEM HCL-DEXTROSE 125-5 MG/125ML-% IV SOLN (PREMIX)
5.0000 mg/h | INTRAVENOUS | Status: DC
  Filled 2022-01-23: qty 125

## 2022-01-23 MED ORDER — METOPROLOL TARTRATE 25 MG PO TABS
25.0000 mg | ORAL_TABLET | Freq: Three times a day (TID) | ORAL | Status: DC
  Administered 2022-01-23 – 2022-01-27 (×9): 25 mg via ORAL
  Filled 2022-01-23 (×11): qty 1

## 2022-01-23 NOTE — Progress Notes (Signed)
PROGRESS NOTE        PATIENT DETAILS Name: Wayne Meadows Age: 87 y.o. Sex: male Date of Birth: 12-15-1934 Admit Date: 01/20/2022 Admitting Physician Wayne Gobble, MD [name], Wayne Meadows  Brief Summary: Patient is a 87 y.o.  male with history of COPD on home O2, ESRD on HD MWF, PAF-not on anticoagulation-presented to hospital with shortness of breath-he was found to have acute on chronic hypoxic respiratory failure requiring BiPAP-due to a combination of pulm edema/from missed hemodialysis with some contribution from his underlying COPD.  He was briefly monitored in the [place] transferred to [facility] on 12/1.  Significant events: 11/28>> presented with SOB-on BiPAP-felt to have pulm edema due to missed HD/COPD exacerbation..  Unable to be liberated off BiPAP even after HD x 1. 12/1>> transfer to [facility]-transient episode of A-fib RVR-spontaneously converted to sinus rhythm.  Significant studies: 11/28>> CXR: Increased pulm edema, unchanged moderate left-sided pleural effusion 11/29>> CXR: Stable large left pleural effusion. 11/29>> echo: EF 65-68%, RV systolic function normal.  Significant microbiology data: 11/28>> COVID/influenza PCR: Negative  Procedures: None  Consults: PCCM  Subjective: Seen multiple times this morning-initially was on BiPAP, then briefly liberated off BiPAP-on 4 L of oxygen but having significant anxiety issues/bronchospasm.  Subsequently went into A-fib RVR-and was placed back on BiPAP again.  Objective: Vitals: Blood pressure (!) 176/68, pulse (!) 104, temperature 98.7 F (37.1 C), temperature source Oral, resp. rate 19, height 6' (1.829 m), weight 72.9 kg, SpO2 92 %.   Exam: Gen Exam: Anxious when not on BiPAP-but not in acute distress. HEENT:atraumatic, normocephalic Chest: Moving air-Coarse rhonchi all over. CVS:S1S2 regular Abdomen:soft non tender, non distended Extremities:no edema Neurology: Non focal Skin:  no rash  Pertinent Labs/Radiology:    Latest Ref Rng & Units 01/23/2022    5:24 AM 01/22/2022    5:50 AM 01/21/2022    6:10 AM  CBC  WBC 4.0 - 10.5 K/uL 8.7  10.6  10.1   Hemoglobin 13.0 - 17.0 g/dL 10.1  10.1  10.7   Hematocrit 39.0 - 52.0 % 30.9  32.5  33.4   Platelets 150 - 400 K/uL 279  256  349     Lab Results  Component Value Date   NA 137 01/22/2022   K 4.1 01/22/2022   CL 93 (L) 01/22/2022   CO2 26 01/22/2022      Assessment/Plan: Acute on chronic hypoxic respiratory failure (on 2-3 L of oxygen at home) COPD exacerbation Multifactorial etiology-pulm edema from missed HD x 1, COPD exacerbation, possible PNA Significant amount of anxiety playing a role when briefly comes off BiPAP.  Briefly of BiPAP for a while but back on BiPAP Given significant bronchospasm-restart IV steroids, scheduled bronchodilators Unclear whether he has some left-sided pneumonia-adjacent to his left pleural effusion-will restart empiric antibiotics given his overall clinical situation. Nephrology following for HD today-if still unable to be liberated off BiPAP after HD today-will likely need CT imaging of his chest.  PAF RVR Brief episode of RVR this morning-spontaneously converted to sinus rhythm Reviewed prior cardiology note-not on anticoagulation due to history of GI bleeding/fall risk (CHA2DS2-VASc 5) Given brief nature of RVR-suspect we could avoid anticoagulation for now Will temporarily switch to short acting Cardizem, beta-blocker dosage increased Monitor in telemetry  Chronic left-sided pleural effusion Suspect transudative-likely related to HD/possible diastolic CHF If unable to be liberated  off the BiPAP-in spite of multiple HD sessions-May need thoracocentesis/CT chest.  Anxiety Playing a big role in his symptomatology Apparently is on BuSpar at home-but since he is on BiPAP-will use IV Ativan as needed.  ESRD on HD MWF See above regarding volume overload Nephrology following  and directing HD care  Hx of CAD-s/p PCI to RCA 2011 Hx of CVA Hx of PAD Stable-continue statin Suspect not on an platelets due to history of recurrent GI bleeding  HLD Continue statin  Hypothyroidism Synthroid  History of gastric/duodenal ulcers Barrett's esophagus No active GI bleeding noted On PPI  Palliative care Very anxious to have a goals of care conversation today Briefly spoke with his daughter Wayne Meadows does not think he would want " machines "/ventilator".  However does not want to make decisions when the patient is still awake/alert. Will keep him as a full code for now-I will continue maximal medical treatment as above-and have palliative care schedule a goals of care conversation.  Underweight: Estimated body mass index is 21.8 kg/m as calculated from the following:   Height as of this encounter: 6' (1.829 m).   Weight as of this encounter: 72.9 kg.   Code status:   Code Status: Full Code   DVT Prophylaxis: heparin injection 5,000 Units Start: 01/20/22 2200   Family Communication: Daughter-Wayne Meadows [phone] -updated over the phone 12/1   Disposition Plan: Status is: Inpatient Remains inpatient appropriate because: Severity of illness   Planned Discharge Destination:Skilled nursing facility   Diet: Diet Order             Diet regular Room service appropriate? Yes; Fluid consistency: Thin  Diet effective now                     Antimicrobial agents: Anti-infectives (From admission, onward)    Start     Dose/Rate Route Frequency Ordered Stop   01/21/22 0830  azithromycin (ZITHROMAX) 500 mg in sodium chloride 0.9 % 250 mL IVPB  Status:  Discontinued        500 mg 250 mL/hr over 60 Minutes Intravenous Every 24 hours 01/21/22 0726 01/22/22 1453   01/20/22 1915  cefTRIAXone (ROCEPHIN) 2 g in sodium chloride 0.9 % 100 mL IVPB  Status:  Discontinued        2 g 200 mL/hr over 30 Minutes Intravenous Every 24 hours 01/20/22 1908 01/22/22 1453    01/20/22 1915  azithromycin (ZITHROMAX) 500 mg in sodium chloride 0.9 % 250 mL IVPB  Status:  Discontinued        500 mg 250 mL/hr over 60 Minutes Intravenous Every 24 hours 01/20/22 1908 01/21/22 0726        MEDICATIONS: Scheduled Meds:  budesonide (PULMICORT) nebulizer solution  0.25 mg Nebulization BID   busPIRone  5 mg Oral TID   Chlorhexidine Gluconate Cloth  6 each Topical Q0600   Chlorhexidine Gluconate Cloth  6 each Topical Q0600   dextromethorphan-guaiFENesin  1 tablet Oral BID   diltiazem  60 mg Oral Q6H   heparin  5,000 Units Subcutaneous Q8H   hydrALAZINE  20 mg Intravenous Once   levalbuterol  0.63 mg Nebulization Q6H   And   ipratropium  0.5 mg Nebulization Q6H   levothyroxine  75 mcg Oral q AM   metoprolol tartrate  25 mg Oral Q8H   mouth rinse  15 mL Mouth Rinse 4 times per day   pantoprazole  40 mg Oral q AM   polyethylene glycol  17 g  Oral Daily   pravastatin  40 mg Oral QHS   predniSONE  40 mg Oral Q breakfast   revefenacin  175 mcg Nebulization Daily   senna  8.6 mg Oral BID   Continuous Infusions: PRN Meds:.acetaminophen, albuterol, heparin, hydrALAZINE, hydrOXYzine, LORazepam, metoprolol tartrate   I have personally reviewed following labs and imaging studies  LABORATORY DATA: CBC: Recent Labs  Lab 01/20/22 1550 01/20/22 1617 01/20/22 1623 01/21/22 0604 01/21/22 0610 01/22/22 0550 01/23/22 0524  WBC 13.4*  --   --   --  10.1 10.6* 8.7  NEUTROABS 12.5*  --   --   --   --   --   --   HGB 10.7*   < > 11.2* 11.2* 10.7* 10.1* 10.1*  HCT 34.4*   < > 33.0* 33.0* 33.4* 32.5* 30.9*  MCV 98.3  --   --   --  96.3 96.4 93.9  PLT 463*  --   --   --  349 256 279   < > = values in this interval not displayed.    Basic Metabolic Panel: Recent Labs  Lab 01/20/22 1550 01/20/22 1617 01/20/22 1623 01/21/22 0604 01/21/22 0610 01/22/22 0550  NA 132* 127* 128* 132* 131* 137  K 6.3* 5.6* 5.7* 4.4 4.5 4.1  CL 91*  --  96*  --  91* 93*  CO2 23  --   --    --  22 26  GLUCOSE 112*  --  120*  --  104* 115*  BUN 53*  --  55*  --  29* 29*  CREATININE 7.91*  --  8.40*  --  4.97* 4.09*  CALCIUM 8.8*  --   --   --  8.3* 8.8*  MG  --   --   --   --  1.9 1.9    GFR: Estimated Creatinine Clearance: 13.6 mL/min (A) (by C-G formula based on SCr of 4.09 mg/dL (H)).  Liver Function Tests: Recent Labs  Lab 01/20/22 1550 01/21/22 0610  AST 15 17  ALT 11 12  ALKPHOS 122 113  BILITOT 2.2* 1.8*  PROT 6.9 6.6  ALBUMIN 3.1* 2.8*   No results for input(s): "LIPASE", "AMYLASE" in the last 168 hours. No results for input(s): "AMMONIA" in the last 168 hours.  Coagulation Profile: No results for input(s): "INR", "PROTIME" in the last 168 hours.  Cardiac Enzymes: No results for input(s): "CKTOTAL", "CKMB", "CKMBINDEX", "TROPONINI" in the last 168 hours.  BNP (last 3 results) No results for input(s): "PROBNP" in the last 8760 hours.  Lipid Profile: No results for input(s): "CHOL", "HDL", "LDLCALC", "TRIG", "CHOLHDL", "LDLDIRECT" in the last 72 hours.  Thyroid Function Tests: Recent Labs    01/20/22 2000 01/21/22 0610  TSH  --  4.573*  FREET4  --  0.66  T3FREE 1.5*  --     Anemia Panel: No results for input(s): "VITAMINB12", "FOLATE", "FERRITIN", "TIBC", "IRON", "RETICCTPCT" in the last 72 hours.  Urine analysis:    Component Value Date/Time   COLORURINE YELLOW 05/01/2021 1446   APPEARANCEUR TURBID (A) 05/01/2021 1446   LABSPEC 1.025 05/01/2021 1446   PHURINE 5.0 05/01/2021 1446   GLUCOSEU NEGATIVE 05/01/2021 1446   HGBUR LARGE (A) 05/01/2021 1446   BILIRUBINUR NEGATIVE 05/01/2021 1446   KETONESUR 5 (A) 05/01/2021 1446   PROTEINUR >=300 (A) 05/01/2021 1446   NITRITE NEGATIVE 05/01/2021 1446   LEUKOCYTESUR MODERATE (A) 05/01/2021 1446    Sepsis Labs: Lactic Acid, Venous    Component Value Date/Time  LATICACIDVEN 0.7 10/22/2021 0252    MICROBIOLOGY: Recent Results (from the past 240 hour(s))  Resp Panel by RT-PCR (Flu  A&B, Covid) Anterior Nasal Swab     Status: None   Collection Time: 01/20/22  2:03 PM   Specimen: Anterior Nasal Swab  Result Value Ref Range Status   SARS Coronavirus 2 by RT PCR NEGATIVE NEGATIVE Final    Comment: (NOTE) SARS-CoV-2 target nucleic acids are NOT DETECTED.  The SARS-CoV-2 RNA is generally detectable in upper respiratory specimens during the acute phase of infection. The lowest concentration of SARS-CoV-2 viral copies this assay can detect is 138 copies/mL. A negative result does not preclude SARS-Cov-2 infection and should not be used as the sole basis for treatment or other patient management decisions. A negative result may occur with  improper specimen collection/handling, submission of specimen other than nasopharyngeal swab, presence of viral mutation(s) within the areas targeted by this assay, and inadequate number of viral copies(<138 copies/mL). A negative result must be combined with clinical observations, patient history, and epidemiological information. The expected result is Negative.  Fact Sheet for Patients:  EntrepreneurPulse.com.au  Fact Sheet for Healthcare Providers:  IncredibleEmployment.be  This test is no t yet approved or cleared by the [place] FDA and  has been authorized for detection and/or diagnosis of SARS-CoV-2 by FDA under an Emergency Use Authorization (EUA). This EUA will remain  in effect (meaning this test can be used) for the duration of the COVID-19 declaration under Section 564(b)(1) of the Act, 21 U.S.C.section 360bbb-3(b)(1), unless the authorization is terminated  or revoked sooner.       Influenza A by PCR NEGATIVE NEGATIVE Final   Influenza B by PCR NEGATIVE NEGATIVE Final    Comment: (NOTE) The Xpert Xpress SARS-CoV-2/FLU/RSV plus assay is intended as an aid in the diagnosis of influenza from Nasopharyngeal swab specimens and should not be used as a sole basis for treatment.  Nasal washings and aspirates are unacceptable for Xpert Xpress SARS-CoV-2/FLU/RSV testing.  Fact Sheet for Patients: EntrepreneurPulse.com.au  Fact Sheet for Healthcare Providers: IncredibleEmployment.be  This test is not yet approved or cleared by the [place] FDA and has been authorized for detection and/or diagnosis of SARS-CoV-2 by FDA under an Emergency Use Authorization (EUA). This EUA will remain in effect (meaning this test can be used) for the duration of the COVID-19 declaration under Section 564(b)(1) of the Act, 21 U.S.C. section 360bbb-3(b)(1), unless the authorization is terminated or revoked.  Performed at [facility], [place] [address], [place], [place] [address]     RADIOLOGY STUDIES/RESULTS: DG CHEST PORT 1 VIEW  Result Date: 01/21/2022 CLINICAL DATA:  None shortness of breath EXAM: PORTABLE CHEST 1 VIEW COMPARISON:  Film from earlier in the same day. FINDINGS: Cardiac shadow is stable. The lungs are well aerated bilaterally. Large left-sided pleural effusion is again noted and stable. No bony abnormality is noted. IMPRESSION: Stable large left pleural effusion. Electronically Signed   By: Inez Catalina M.D.   On: 01/21/2022 21:27     LOS: 3 days   Oren Binet, MD  [facility]    To contact the attending provider between 7A-7P or the covering provider during after hours 7P-7A, please log into the web site [email or web address] and access using universal [place] password for that web site. If you do not have the password, please call the hospital operator.  01/23/2022, 11:14 AM

## 2022-01-23 NOTE — Progress Notes (Signed)
Bipap patient transported from [id number] to Dialysis without any complications.

## 2022-01-23 NOTE — Progress Notes (Signed)
[facility] Progress Note  Subjective: back on bipap now. CXR repeat does not show pulm edema, bibasilar atx and L effusion.   Vitals:   01/22/22 2355 01/23/22 0300 01/23/22 0821 01/23/22 1220  BP:  (!) 165/56 (!) 176/68 (!) 165/68  Pulse:  90 (!) 104 86  Resp:  18 19   Temp:  97.9 F (36.6 C) 98.7 F (37.1 C)   TempSrc:  Axillary Oral   SpO2: 97% 96% 92%   Weight:      Height:        Exam: Gen on [place] O2, lethargic on bipap No jvd or bruits Chest no sig rales, air movement ok  RRR no MRG Abd soft ntnd no mass or ascites +bs Ext no edema, cachectic  Neuro as above    RUA AVF+bruit    Home meds include - buspar, cardizem cd 240 qd, norco prn, combivent respimat, duoneb, synthroid, metoprolol 12.5 bid, pantoprazole, pravachol, renvela 2 ac tid, flomax, prns/ vits/ supps         OP HD: MWF [facility] 4h  400/1.5   72.5kg  3K/2.5Ca bath   Hep 3000  RUA AVF - last HD 11/24, post 74kg - mircera 75 ug q4, last 11/21 - no vdra   Assessment/ Plan: Resp distress - pulm edema by CXR +/- COPD +/- pna. IV abx per pmd. Pt missed HD x one. Pulm edema (mild) per initial CXR. Had HD last 2 days for 3.5L and 2.5L respectively. Came off bipap yest but back on today. Repeat film does not show recurrent edema, rather L effusions and bilat atx/ infiltrates (?aspiration). Wheezing is better too. Will try to get more fluid off w/ HD today to see if this helps.  ESRD - usual HD MWF. Missed HD x 1. Had HD here tues and wed. HD today Atrial fib - getting po BB and cardizem, HR 80s- 90s.  COPD - per pmd  HTN - as above MBD ckd - labs pending Anemia ckd - Hb 10.7, outpt esa not due for another 3 wks. Follow. GOC - frequent admissions, cachectic, frail, elderly , esrd and not doing well acutely, will consult pallative care          Wayne Meadows 01/23/2022, 12:55 PM   Recent Labs  Lab 01/20/22 1550 01/20/22 1617 01/21/22 0610 01/22/22 0550 01/23/22 0524 01/23/22 1134  HGB 10.7*    < > 10.7* 10.1* 10.1* 10.4*  ALBUMIN 3.1*  --  2.8*  --   --   --   CALCIUM 8.8*  --  8.3* 8.8*  --   --   CREATININE 7.91*   < > 4.97* 4.09*  --   --   K 6.3*   < > 4.5 4.1  --   --    < > = values in this interval not displayed.    No results for input(s): "IRON", "TIBC", "FERRITIN" in the last 168 hours. Inpatient medications:  budesonide (PULMICORT) nebulizer solution  0.25 mg Nebulization BID   busPIRone  5 mg Oral TID   Chlorhexidine Gluconate Cloth  6 each Topical Q0600   Chlorhexidine Gluconate Cloth  6 each Topical Q0600   dextromethorphan-guaiFENesin  1 tablet Oral BID   diltiazem  60 mg Oral Q6H   heparin  5,000 Units Subcutaneous Q8H   hydrALAZINE  20 mg Intravenous Once   levalbuterol  0.63 mg Nebulization Q6H   And   ipratropium  0.5 mg Nebulization Q6H   levothyroxine  75  mcg Oral q AM   methylPREDNISolone (SOLU-MEDROL) injection  40 mg Intravenous Q12H   metoprolol tartrate  25 mg Oral Q8H   mouth rinse  15 mL Mouth Rinse 4 times per day   pantoprazole (PROTONIX) IV  40 mg Intravenous Q12H   polyethylene glycol  17 g Oral Daily   pravastatin  40 mg Oral QHS   senna  8.6 mg Oral BID    ceFEPime (MAXIPIME) IV     vancomycin 1,250 mg (01/23/22 1233)   acetaminophen, albuterol, heparin, hydrALAZINE, hydrOXYzine, LORazepam, metoprolol tartrate

## 2022-01-23 NOTE — Consult Note (Signed)
Palliative Medicine Inpatient Consult Note  Consulting Provider: Dr. Jonnie Finner  Reason for consult:   [facility] [facility]iative Medicine Consult  Reason for Consult? very frail 87 yo not doing well acutely  , cachectic, ESRD, frequent hospital stays,  please assess for [place]   01/23/2022  HPI:  Per intake H&P --> Patient is a 87 y.o.  male with history of COPD on home O2, ESRD on HD MWF, PAF-not on anticoagulation-presented to hospital with shortness of breath-he was found to have acute on chronic hypoxic respiratory failure requiring BiPAP-due to a combination of pulm edema/from missed hemodialysis with some contribution from his underlying COPD.  He was briefly monitored in the [facility] transferred to [facility] on 12/1.   Palliative care has been asked to get involved in the setting of chronic frailty to discuss goals of care.   Clinical Assessment/Goals of Care:  *Please note that this is a verbal dictation therefore any spelling or grammatical errors are due to the "[place]" system interpretation.  I have reviewed medical records including EPIC notes, labs and imaging, received report from bedside RN, assessed the patient who is in bed on bipap. He was able to have this removed for a short time to have a conversation.    I met with Andreus to further discuss diagnosis prognosis, GOC, EOL wishes, disposition and options.   I introduced Palliative Medicine as specialized medical care for people living with serious illness. It focuses on providing relief from the symptoms and stress of a serious illness. The goal is to improve quality of life for both the patient and the family.  Medical History Review and Understanding:  Kazden and I reviewed his history of ESRD on HD, COPD, PAF, HTN, CVA, CAD, renal carcinoma, and pancreatitis.   Social History:  Case lives in [place]. He is a Theatre manager. He has two children, one grandchild, and four great grandchildren. 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 He enjoyed tinkering in the garage and shares a love of foreign race cares such as Industrial/product designer. He is not a man of overt faith.   Functional and Nutritional State:  Patient has been living at [facility] since February of this year. He has relied on staff there for bADL's, he is able to walk with a walker though is not terribly motivated to do so. He is able to feed himself.   Palliative Symptoms:  Shortness of breath in the setting of chronic COPD and having missed HD session.   Advance Directives:  A detailed discussion was had today regarding advanced directives.  Patient designates his daughter, [name] as his surrogate Media planner.   Code Status:  Concepts specific to code status, artifical feeding and hydration, continued IV antibiotics and rehospitalization was had.  The difference between a aggressive medical intervention path  and a palliative comfort care path for this patient at this time was had.  Prior MOST form reviewed which enlisted full scope/full care.  Encouraged Gwyndolyn Saxon to consider DNR/DNI status understanding evidenced based poor outcomes in similar hospitalized patient, as the cause of arrest is likely associated with advanced chronic/terminal illness rather than an easily reversible acute cardio-pulmonary event. I explained that DNR/DNI does not change the medical plan and it only comes into effect after a person has arrested (died).  It is a protective measure to keep [place] from harming the patient in their last moments of life.  Despite the above knowledge at this  time Majed would desire all measures to preserve life.  He expresses that he would like to have a little bit more time with his great-grandchildren.  I emphasized that it is important to consider what the quality of that time looks like.  " Hard choices" booklet left at bedside for patient and his family to  review  Discussion:  Breyden and I reviewed the events leading to hospitalization.  We discussed the pleural effusion on his left lung which appears chronic.  We discussed his ongoing and worsening dyspnea this week in the setting of having missed dialysis this past Monday.  Reviewed conditions likely contributing inclusive of underlying COPD and atrial fibrillation.  Gwyndolyn Saxon and I talked about his initial hope to graduate from [place] after getting rehabilitation though his inability to do so.  Reviewed that this was a disappointment to him as he was hopeful he will get back to his prior living environment.  I shared with Garett that he has been recurrently readmitted to the hospital and appears overall to continue to face hurdles on his road to recovery.  I shared that perhaps it is worthwhile to have a meeting with he and his daughter present to further identify what we should and should not do moving into the future.  He is open to this and agrees to my calling his daughter [name] to set up a meeting this weekend.  Discussed the importance of continued conversation with family and their  medical providers regarding overall plan of care and treatment options, ensuring decisions are within the context of the patients values and GOCs. ____________________________________ Addendum:  I called and spoke with Eleuterio's daughter Tammy this afternoon.  She shares with me that Sean has for a long period of time been fairly sedentary which certainly has contributed to his cachexia.  She expresses that in January of this year he got cellulitis and had very significant dehydration as result of that.  It was during this hospitalization that he was initiated on hemodialysis.  Tammy expresses that Makya has refused dialysis on multiple occasions in the past is not clear if he wants to continue doing this.  She expresses that both she and her sister-in-law have tried to motivate Phares and get him to a  point where he would participate more meaningfully in rehabilitation at [facility] though this has been to no avail.  Tammy shares with me the precipitous decline Alvie has experienced in the setting of recurrent rehospitalization's due to infectious processes inclusive of cellulitis and pneumonia.  Tammy is well aware that jerzy roepke is quite frail and not thriving.  We did discuss having an in person meeting Sunday at noon for further conversations.  Tammy vocalizes that these conversations need to be had and we identified the importance of discussing CODE STATUS, the idea of continuing dialysis, and the idea of rehospitalization.  I shared that if Josefa Half to determine he no longer wants dialysis then we will further talk about comfort and hospice care.  Decision Maker: Lyna Poser (Daughter): [phone] (Home Phone)   SUMMARY OF RECOMMENDATIONS   FULL CODE --> strongly encourage consideration of DNAR given patient's underlying comorbidities and generalized frailty  Open and honest conversations were had with Gwyndolyn Saxon and his daughter [name] in the setting of his acute on chronic illness  Plan for family meeting on Sunday at noon  Ongoing palliative care support  Code Status/Advance Care Planning: FULL CODE  Palliative Prophylaxis:  Aspiration, Bowel Regimen, Delirium Protocol, Frequent Pain Assessment, Oral  Care, Palliative Wound Care, and Turn Reposition  Additional Recommendations (Limitations, Scope, Preferences): Continue current course Psycho-social/Spiritual:  Desire for further Chaplaincy support: No, not at this time Additional Recommendations: Provided education on chronic illnesses   Prognosis: Multiple chronic co-morbidities. Increased 12 month mortality risk.   Discharge Planning: Discharge plan is uncertain at this time, previously from [facility].  Vitals:   01/23/22 1220 01/23/22 1300  BP: (!) 165/68 (!) 165/67  Pulse: 86 85  Resp:  14  Temp:   98.2 F (36.8 C)  SpO2:  98%    Intake/Output Summary (Last 24 hours) at 01/23/2022 1320 Last data filed at 01/22/2022 1745 Gross per 24 hour  Intake --  Output 1 ml  Net -1 ml   Last Weight  Most recent update: 01/22/2022  5:07 AM    Weight  72.9 kg (160 lb 11.5 oz)            Gen:  Elderly Caucasian M in moderate distress HEENT: Dry mucous membranes CV: Regular rate and irregular rhythm  PULM: On Bipap ABD: soft/nontender EXT: No edema Neuro: Alert and oriented x3  PPS: 30%   This conversation/these recommendations were discussed with patient primary care team, Dr. Sloan Leiter  Billing based on MDM: High  Problems Addressed: One acute or chronic illness or injury that poses a threat to life or bodily function  Amount and/or Complexity of Data: Category 3:Discussion of management or test interpretation with external physician/other qualified health care professional/appropriate source (not separately reported)  Risks: Decision regarding hospitalization or escalation of hospital care and Decision not to resuscitate or to de-escalate care because of poor prognosis ______________________________________________________ [facility] Team Team Cell Phone: [phone] Please utilize secure chat with additional questions, if there is no response within 30 minutes please call the above phone number  Palliative Medicine Team providers are available by phone from 7am to 7pm daily and can be reached through the team cell phone.  Should this patient require assistance outside of these hours, please call the patient's attending physician.

## 2022-01-23 NOTE — Progress Notes (Signed)
Patient was transported from Hemodialysis via the BiPAP  with no complications.

## 2022-01-23 NOTE — Progress Notes (Signed)
Pharmacy Antibiotic Note  Wayne Meadows is a 87 y.o. male with pneumonia.  Pharmacy has been consulted for Vancomycin / Cefepime.  ESRD - usually HD MWF  Plan: Vancomycin 1250 mg iv x 1 - follow up inpatient HD schedule Cefepime 1 gram iv Q 24 hours  Height: 6' (182.9 cm) Weight: 72.9 kg (160 lb 11.5 oz) IBW/kg (Calculated) : 77.6  Temp (24hrs), Avg:98.1 F (36.7 C), Min:97.9 F (36.6 C), Max:98.7 F (37.1 C)  Recent Labs  Lab 01/20/22 1550 01/20/22 1623 01/21/22 0610 01/22/22 0550 01/23/22 0524  WBC 13.4*  --  10.1 10.6* 8.7  CREATININE 7.91* 8.40* 4.97* 4.09*  --     Estimated Creatinine Clearance: 13.6 mL/min (A) (by C-G formula based on SCr of 4.09 mg/dL (H)).    Allergies  Allergen Reactions   Bee Venom Anaphylaxis   Influenza Vaccines Other (See Comments)    "Mortally sick for 2 weeks"      Thank you for allowing pharmacy to be a part of this patient's care.  Tad Moore 01/23/2022 12:29 PM

## 2022-01-23 NOTE — Progress Notes (Signed)
Received patient in bed to unit.  Alert and oriented.  Informed consent signed and in chart.    Patient tolerated well.  Transported back to the room  Alert, without acute distress.  Hand-off given to patient's nurse.   Access used: Right AVF Access issues: none  Total UF removed: 2.5L Medication(s) given: none Post HD VS: BP: 146/57, pulse: 63, RR: 20, O2sat: 100 Post HD weight: 70.1 kg   [facility]

## 2022-01-24 ENCOUNTER — Inpatient Hospital Stay (HOSPITAL_COMMUNITY): Payer: No Typology Code available for payment source

## 2022-01-24 DIAGNOSIS — Z515 Encounter for palliative care: Secondary | ICD-10-CM | POA: Diagnosis not present

## 2022-01-24 DIAGNOSIS — J9601 Acute respiratory failure with hypoxia: Secondary | ICD-10-CM | POA: Diagnosis not present

## 2022-01-24 LAB — CBC
HCT: 32.8 % — ABNORMAL LOW (ref 39.0–52.0)
Hemoglobin: 10.4 g/dL — ABNORMAL LOW (ref 13.0–17.0)
MCH: 30 pg (ref 26.0–34.0)
MCHC: 31.7 g/dL (ref 30.0–36.0)
MCV: 94.5 fL (ref 80.0–100.0)
Platelets: 316 10*3/uL (ref 150–400)
RBC: 3.47 MIL/uL — ABNORMAL LOW (ref 4.22–5.81)
RDW: 15.3 % (ref 11.5–15.5)
WBC: 8.1 10*3/uL (ref 4.0–10.5)
nRBC: 0 % (ref 0.0–0.2)

## 2022-01-24 LAB — BASIC METABOLIC PANEL
Anion gap: 20 — ABNORMAL HIGH (ref 5–15)
BUN: 42 mg/dL — ABNORMAL HIGH (ref 8–23)
CO2: 20 mmol/L — ABNORMAL LOW (ref 22–32)
Calcium: 8.4 mg/dL — ABNORMAL LOW (ref 8.9–10.3)
Chloride: 96 mmol/L — ABNORMAL LOW (ref 98–111)
Creatinine, Ser: 4.47 mg/dL — ABNORMAL HIGH (ref 0.61–1.24)
GFR, Estimated: 12 mL/min — ABNORMAL LOW (ref >60)
Glucose, Bld: 84 mg/dL (ref 70–99)
Potassium: 4.3 mmol/L (ref 3.5–5.1)
Sodium: 136 mmol/L (ref 135–145)

## 2022-01-24 LAB — GLUCOSE, CAPILLARY
Glucose-Capillary: 84 mg/dL (ref 70–99)
Glucose-Capillary: 84 mg/dL (ref 70–99)
Glucose-Capillary: 90 mg/dL (ref 70–99)
Glucose-Capillary: 120 mg/dL — ABNORMAL HIGH (ref 70–99)

## 2022-01-24 LAB — C-REACTIVE PROTEIN: CRP: 6.4 mg/dL — ABNORMAL HIGH (ref <1.0)

## 2022-01-24 LAB — MAGNESIUM: Magnesium: 1.8 mg/dL (ref 1.7–2.4)

## 2022-01-24 LAB — PROCALCITONIN: Procalcitonin: 1.32 ng/mL

## 2022-01-24 LAB — BRAIN NATRIURETIC PEPTIDE: B Natriuretic Peptide: 3340.8 pg/mL — ABNORMAL HIGH (ref 0.0–100.0)

## 2022-01-24 MED ORDER — CHLORHEXIDINE GLUCONATE CLOTH 2 % EX PADS
6.0000 | MEDICATED_PAD | Freq: Every day | CUTANEOUS | Status: DC
  Administered 2022-01-25 – 2022-01-27 (×3): 6 via TOPICAL

## 2022-01-24 MED ORDER — NALOXONE HCL 0.4 MG/ML IJ SOLN: 0.4000 mg | INTRAMUSCULAR | Status: DC | PRN

## 2022-01-24 MED ORDER — VANCOMYCIN HCL 750 MG/150ML IV SOLN
750.0000 mg | Freq: Once | INTRAVENOUS | Status: AC
  Administered 2022-01-24: 750 mg via INTRAVENOUS
  Filled 2022-01-24: qty 150

## 2022-01-24 MED ORDER — ORAL CARE MOUTH RINSE: 15.0000 mL | OROMUCOSAL | Status: DC | PRN

## 2022-01-24 MED ORDER — HYDROCODONE-ACETAMINOPHEN 5-325 MG PO TABS
1.0000 | ORAL_TABLET | Freq: Four times a day (QID) | ORAL | Status: DC | PRN
  Administered 2022-01-24 – 2022-01-27 (×5): 1 via ORAL
  Filled 2022-01-24 (×5): qty 1

## 2022-01-24 NOTE — Evaluation (Signed)
Occupational Therapy Evaluation Patient Details Name: Wayne Meadows MRN: [id number] DOB: 1935-02-21 Today's Date: 01/24/2022   History of Present Illness 87 y.o. male admitted  01/20/22 with Acute on chronic hypoxic respiratory failure, PAF RVR, and Left pleural effusion. history of COPD on home O2, ESRD on HD MWF, PAF.   Clinical Impression   Patient admitted for the diagnosis above.  Patient stating he is currently at a local SNF, but is hoping to eventually return home.  Patient has poor insight into his deficits, poor dynamic balance and is weak.  The plan is for him to return to a SNF, and continue short term rehab.  OT will follow in the acute setting.        Recommendations for follow up therapy are one component of a multi-disciplinary discharge planning process, led by the attending physician.  Recommendations may be updated based on patient status, additional functional criteria and insurance authorization.   Follow Up Recommendations  Skilled nursing-short term rehab (<3 hours/day)     Assistance Recommended at Discharge Frequent or constant Supervision/Assistance  Patient can return home with the following A little help with bathing/dressing/bathroom;A little help with walking and/or transfers;Assist for transportation;Assistance with cooking/housework    Functional Status Assessment  Patient has had a recent decline in their functional status and demonstrates the ability to make significant improvements in function in a reasonable and predictable amount of time.  Equipment Recommendations  None recommended by OT    Recommendations for Other Services       Precautions / Restrictions Precautions Precautions: Fall Precaution Comments: monitor O2 Restrictions Weight Bearing Restrictions: No      Mobility Bed Mobility   Bed Mobility: Sit to Supine       Sit to supine: Supervision        Transfers Overall transfer level: Needs assistance Equipment used:  Rolling walker (2 wheels) Transfers: Sit to/from Stand, Bed to chair/wheelchair/BSC Sit to Stand: Min assist     Step pivot transfers: Min assist            Balance Overall balance assessment: Needs assistance Sitting-balance support: No upper extremity supported, Feet supported Sitting balance-Leahy Scale: Fair     Standing balance support: Bilateral upper extremity supported Standing balance-Leahy Scale: Poor                             ADL either performed or assessed with clinical judgement   ADL Overall ADL's : Needs assistance/impaired Eating/Feeding: Set up;Sitting   Grooming: Wash/dry hands;Wash/dry face;Set up;Sitting   Upper Body Bathing: Set up;Sitting   Lower Body Bathing: Minimal assistance;Sit to/from stand   Upper Body Dressing : Set up;Sitting   Lower Body Dressing: Minimal assistance;Sit to/from stand   Toilet Transfer: Minimal Teacher, English as a foreign language;Ambulation                   Vision Baseline Vision/History: 1 Wears glasses Patient Visual Report: No change from baseline       Perception     Praxis      Pertinent Vitals/Pain Pain Assessment Pain Assessment: No/denies pain Pain Intervention(s): Monitored during session     Hand Dominance Right   Extremity/Trunk Assessment Upper Extremity Assessment Upper Extremity Assessment: Generalized weakness   Lower Extremity Assessment Lower Extremity Assessment: Defer to PT evaluation   Cervical / Trunk Assessment Cervical / Trunk Assessment: Kyphotic   Communication Communication Communication: HOH   Cognition Arousal/Alertness: Awake/alert Behavior During Therapy: [facility]  for tasks assessed/performed Overall Cognitive Status: No family/caregiver present to determine baseline cognitive functioning Area of Impairment: Problem solving, Following commands, Safety/judgement                       Following Commands: Follows one step commands  inconsistently Safety/Judgement: Decreased awareness of safety, Decreased awareness of deficits   Problem Solving: Slow processing, Requires verbal cues       General Comments   VSS on supplemental O2    Exercises     Shoulder Instructions      Home Living Family/patient expects to be discharged to:: Skilled nursing facility                                 Additional Comments: Pt plans to return to [facility] though hoping to get back home soon      Prior Functioning/Environment Prior Level of Function : Needs assist             Mobility Comments: Was walking and exercising at [facility] with rehab team. States he had gotten weaker over the past couple of weeks, having more difficulty ADLs Comments: pt reports that he was able to partiicpate with bathing and dressing at sit to stand level, but needing increasing assist.        OT Problem List: Decreased strength;Decreased activity tolerance;Impaired balance (sitting and/or standing);Decreased safety awareness;Decreased cognition      OT Treatment/Interventions: Self-care/ADL training;Therapeutic exercise;Therapeutic activities;Cognitive remediation/compensation;Patient/family education;Balance training;DME and/or AE instruction    OT Goals(Current goals can be found in the care plan section) Acute Rehab OT Goals Patient Stated Goal: Return home OT Goal Formulation: With patient Time For Goal Achievement: 02/06/22 Potential to Achieve Goals: Good ADL Goals Pt Will Perform Grooming: with supervision;standing Pt Will Perform Lower Body Dressing: with supervision;sit to/from stand Pt Will Transfer to Toilet: with supervision;ambulating;regular height toilet Pt/caregiver will Perform Home Exercise Program: Increased strength;Both right and left upper extremity;With theraband;With Supervision  OT Frequency: Min 2X/week    Co-evaluation              AM-PAC OT "6 Clicks" Daily Activity     Outcome  Measure Help from another person eating meals?: None Help from another person taking care of personal grooming?: None Help from another person toileting, which includes using toliet, bedpan, or urinal?: A Little Help from another person bathing (including washing, rinsing, drying)?: A Little Help from another person to put on and taking off regular upper body clothing?: None Help from another person to put on and taking off regular lower body clothing?: A Little 6 Click Score: 21   End of Session Equipment Utilized During Treatment: Rolling walker (2 wheels);Oxygen Nurse Communication: Mobility status  Activity Tolerance: Patient tolerated treatment well Patient left: in bed;with call bell/phone within reach;with bed alarm set  OT Visit Diagnosis: Unsteadiness on feet (R26.81);Other symptoms and signs involving cognitive function                Time: 1550-1606 OT Time Calculation (min): 16 min Charges:  OT General Charges $OT Visit: 1 Visit OT Evaluation $OT Eval Moderate Complexity: 1 Mod  01/24/2022  RP, OTR/L  Acute Rehabilitation Services  Office:  [phone]   Metta Clines 01/24/2022, 4:12 PM

## 2022-01-24 NOTE — Evaluation (Signed)
Physical Therapy Evaluation Patient Details Name: Wayne Meadows MRN: [id number] DOB: 04-04-1934 Today's Date: 01/24/2022  History of Present Illness  87 y.o. male admitted  01/20/22 with Acute on chronic hypoxic respiratory failure, PAF RVR, and Left pleural effusion. history of COPD on home O2, ESRD on HD MWF, PAF.  Clinical Impression  Pt admitted with above diagnosis. Min assist with transfer out of bed using RW, showing posterior LOB. SpO2 91% on 7L HHFNC HR in 60s. Reviewed exercises for LE strengthening.   Pt currently with functional limitations due to the deficits listed below (see PT Problem List). Pt will benefit from skilled PT to increase their independence and safety with mobility to allow discharge to the venue listed below.          Recommendations for follow up therapy are one component of a multi-disciplinary discharge planning process, led by the attending physician.  Recommendations may be updated based on patient status, additional functional criteria and insurance authorization.  Follow Up Recommendations Skilled nursing-short term rehab (<3 hours/day) Can patient physically be transported by private vehicle: Yes    Assistance Recommended at Discharge Intermittent Supervision/Assistance  Patient can return home with the following  A little help with walking and/or transfers;A little help with bathing/dressing/bathroom;Assistance with cooking/housework;Direct supervision/assist for financial management;Direct supervision/assist for medications management;Assist for transportation;Help with stairs or ramp for entrance    Equipment Recommendations None recommended by PT  Recommendations for Other Services       Functional Status Assessment Patient has had a recent decline in their functional status and demonstrates the ability to make significant improvements in function in a reasonable and predictable amount of time.     Precautions / Restrictions  Precautions Precautions: Fall Precaution Comments: monitor O2 Restrictions Weight Bearing Restrictions: No      Mobility  Bed Mobility Overal bed mobility: Needs Assistance Bed Mobility: Supine to Sit     Supine to sit: Supervision     General bed mobility comments: extra time, assist with lines only    Transfers Overall transfer level: Needs assistance Equipment used: Rolling walker (2 wheels) Transfers: Sit to/from Stand, Bed to chair/wheelchair/BSC Sit to Stand: Min assist   Step pivot transfers: Min assist       General transfer comment: Min assist for boost and balance, leaning posteriorly, cues for technique, required a couple of attempts. Min assist with step pivot to recliner. Tolerated standing approx 3 minutes became fatigued, SpO2 91% on 7L HHFNC, returned to 96% seated. HR 62,    Ambulation/Gait               General Gait Details: deferred  Stairs            Wheelchair Mobility    Modified Rankin (Stroke Patients Only)       Balance Overall balance assessment: Needs assistance Sitting-balance support: No upper extremity supported, Feet supported Sitting balance-Leahy Scale: Fair     Standing balance support: Bilateral upper extremity supported Standing balance-Leahy Scale: Poor Standing balance comment: posterior instability.                             Pertinent Vitals/Pain Pain Assessment Pain Assessment: Faces Faces Pain Scale: Hurts a little bit Pain Location: left flank (hx of fx this year reported.) Pain Descriptors / Indicators: Aching Pain Intervention(s): Monitored during session, Patient requesting pain meds-RN notified    Home Living Family/patient expects to be discharged to:: Skilled nursing facility  Additional Comments: Pt plans to return to [facility] though hoping to get back home soon    Prior Function Prior Level of Function : Needs assist  Cognitive Assist : Mobility  (cognitive)     Physical Assist : Mobility (physical) Mobility (physical): Gait;Transfers;Bed mobility   Mobility Comments: Was walking and exercising at [facility] with rehab team. States he had gotten weaker over the past couple of weeks, having more difficulty       Hand Dominance   Dominant Hand: Right    Extremity/Trunk Assessment   Upper Extremity Assessment Upper Extremity Assessment: Defer to OT evaluation    Lower Extremity Assessment Lower Extremity Assessment: Generalized weakness       Communication   Communication: HOH  Cognition Arousal/Alertness: Awake/alert Behavior During Therapy: WFL for tasks assessed/performed Overall Cognitive Status: No family/caregiver present to determine baseline cognitive functioning                                 General Comments: Oriented, however delayed and reports seeing spiders and bugs on the floor and wall - reoriented, as there are no bugs where he is stating he sees them.        General Comments      Exercises General Exercises - Lower Extremity Ankle Circles/Pumps: AROM, Both, 20 reps, Supine Quad Sets: Strengthening, Both, 15 reps, Supine Gluteal Sets: Strengthening, Both, 15 reps, Seated Long Arc Quad: Strengthening, Both, 10 reps, Seated Hip ABduction/ADduction: Strengthening, Both, 10 reps, Seated Straight Leg Raises: Strengthening, Both, 5 reps, Supine   Assessment/Plan    PT Assessment Patient needs continued PT services  PT Problem List Decreased strength;Decreased range of motion;Decreased activity tolerance;Decreased balance;Decreased mobility;Decreased cognition;Decreased coordination;Decreased knowledge of use of DME;Decreased safety awareness;Decreased knowledge of precautions;Cardiopulmonary status limiting activity;Pain       PT Treatment Interventions DME instruction;Gait training;Functional mobility training;Therapeutic activities;Therapeutic exercise;Balance  training;Neuromuscular re-education;Patient/family education;Cognitive remediation    PT Goals (Current goals can be found in the Care Plan section)  Acute Rehab PT Goals Patient Stated Goal: Go back to rehab, regain strength and eventually go home. PT Goal Formulation: With patient Time For Goal Achievement: 02/07/22 Potential to Achieve Goals: Good    Frequency Min 2X/week     Co-evaluation               AM-PAC PT "6 Clicks" Mobility  Outcome Measure Help needed turning from your back to your side while in a flat bed without using bedrails?: A Little Help needed moving from lying on your back to sitting on the side of a flat bed without using bedrails?: A Little Help needed moving to and from a bed to a chair (including a wheelchair)?: A Little Help needed standing up from a chair using your arms (e.g., wheelchair or bedside chair)?: A Little Help needed to walk in hospital room?: A Lot Help needed climbing 3-5 steps with a railing? : Total 6 Click Score: 15    End of Session Equipment Utilized During Treatment: Gait belt;Oxygen Activity Tolerance: Patient limited by fatigue Patient left: in chair;with call bell/phone within reach;with chair alarm set Nurse Communication: Mobility status;Other (comment) (visual hallucinations) PT Visit Diagnosis: Unsteadiness on feet (R26.81);Other abnormalities of gait and mobility (R26.89);Muscle weakness (generalized) (M62.81);History of falling (Z91.81);Difficulty in walking, not elsewhere classified (R26.2);Pain Pain - Right/Left: Left Pain - part of body:  (flank)    Time: 6644-0347 PT Time Calculation (min) (ACUTE ONLY): 33 min  Charges:   PT Evaluation $PT Eval Low Complexity: 1 Low PT Treatments $Therapeutic Activity: 8-22 mins        Candie Mile, PT, DPT Physical Therapist Acute Rehabilitation Services [place] 01/24/2022, 10:42 AM

## 2022-01-24 NOTE — Progress Notes (Signed)
   01/24/22 2140  Vitals  Temp 98.7 F (37.1 C)  BP (!) 140/68  Pulse Rate 90  ECG Heart Rate (!) 124  Resp 18  Post Treatment  Dialyzer Clearance Heavily streaked  Duration of HD Treatment -hour(s) 3.5 hour(s)  Liters Processed 69.5  Fluid Removed (mL) 2700 mL  Tolerated HD Treatment Yes   TX fin. W/o difficulty

## 2022-01-24 NOTE — Progress Notes (Signed)
   Palliative Medicine Inpatient Follow Up Note   HPI:  Patient is a 87 y.o.  male with history of COPD on home O2, ESRD on HD MWF, PAF-not on anticoagulation-presented to hospital with shortness of breath-he was found to have acute on chronic hypoxic respiratory failure requiring BiPAP-due to a combination of pulm edema/from missed hemodialysis with some contribution from his underlying COPD.  He was briefly monitored in the ICU-but transferred to [facility] on 12/1.    Palliative care has been asked to get involved in the setting of chronic frailty to discuss goals of care.   Today's Discussion 01/24/2022  *Please note that this is a verbal dictation therefore any spelling or grammatical errors are due to the "Dragon Medical One" system interpretation.  Chart reviewed inclusive of vital signs, progress notes, laboratory results, and diagnostic images.   I met with Wayne Meadows at bedside this morning. I took the bipap off and placed him on [place] in order to have a conversation. [name] was alert to self and place though was hallucinating that his son was beside us. I was able to reorient him. WE discussed the severity of his illness' and how he is contending overall with a consistently declining health state. I re-broached to topic of code status though Bawi is again vocal about his desire for resuscitation.  I shared the concerns over traumatizing his body and his health being in a far worse state after the fact. He was not phased by this reality it appeared.  We discussed the plan for a family meeting tomorrow with patients daughter and daughter in law.   Questions and concerns addressed/Palliative Support Provided.   Objective Assessment: Vital Signs Vitals:   01/24/22 0743 01/24/22 0800  BP: (!) 151/53 (!) 146/51  Pulse:    Resp:  18  Temp:  98.4 F (36.9 C)  SpO2:  99%    Intake/Output Summary (Last 24 hours) at 01/24/2022 0904 Last data filed at 01/23/2022 1747 Gross per 24 hour  Intake  --  Output 2500 ml  Net -2500 ml   Last Weight  Most recent update: 01/24/2022  5:33 AM    Weight  70.5 kg (155 lb 6.8 oz)            Gen:  Elderly Caucasian M in moderate distress HEENT: Dry mucous membranes CV: Regular rate and irregular rhythm  PULM: On Bipap ABD: soft/nontender EXT: No edema Neuro: Alert and oriented x2  SUMMARY OF RECOMMENDATIONS   FULL CODE --> strongly encourage consideration of DNAR given patient's underlying comorbidities and generalized frailty    Plan for family meeting on Sunday at noon  Delirium precautions   Ongoing palliative care support  Billing based on MDM: High  ______________________________________________________________________________________   [facility] Palliative Medicine Team Team Cell Phone: [phone] Please utilize secure chat with additional questions, if there is no response within 30 minutes please call the above phone number  Palliative Medicine Team providers are available by phone from 7am to 7pm daily and can be reached through the team cell phone.  Should this patient require assistance outside of these hours, please call the patient's attending physician.     

## 2022-01-24 NOTE — Progress Notes (Signed)
Pt taken off bipap and placed on 7L [place] HFNC at this time, wean O2 as tolerated.

## 2022-01-24 NOTE — Progress Notes (Signed)
Wayne Meadows  Subjective: pt w/ no new c/o's  Vitals:   01/24/22 0500 01/24/22 0700 01/24/22 0743 01/24/22 0800  BP:   (!) 151/53 (!) 146/51  Pulse:      Resp:    18  Temp:    98.4 F (36.9 C)  TempSrc:    Oral  SpO2:  99%  99%  Weight: 70.5 kg     Height:        Exam: Gen seen in room, not in distress No jvd or bruits Chest no sig rales, air movement ok  RRR no MRG Abd soft ntnd no mass or ascites +bs Ext no edema, cachectic  Neuro as above    RUA AVF+bruit    Home meds include - buspar, cardizem cd 240 qd, norco prn, combivent respimat, duoneb, synthroid, metoprolol 12.5 bid, pantoprazole, pravachol, renvela 2 ac tid, flomax, prns/ vits/ supps         OP HD: MWF [facility] 4h  400/1.5   72.5kg  3K/2.5Ca bath   Hep 3000  RUA AVF - last HD 11/24, post 74kg - mircera 75 ug q4, last 11/21 - no vdra   Assessment/ Plan: Resp distress - pulm edema by CXR +/- COPD +/- pna. IV abx per pmd. Pt missed HD x one. Pulm edema (mild) per initial CXR. Had HD here 3 times, is down 5kg. Needs fluid restriction and I changed to renal diet. Extra HD today, further UF as tol.  ESRD - usual HD MWF. Missed HD x 1. HD today off schedule, extra HD for volume. Atrial fib - getting po BB and cardizem, HR 80s- 90s.  COPD - per pmd  HTN - BP's good MBD ckd - labs pending Anemia ckd - Hb 10.7, outpt esa not due for another 3 wks. Follow. GOC - frequent admissions, cachectic, frail, elderly , esrd and not doing well acutely, will consult pallative care          Wayne Meadows 01/24/2022, 11:32 AM   Recent Labs  Lab 01/21/22 0610 01/22/22 0550 01/23/22 1134 01/23/22 1425 01/24/22 0149 01/24/22 0710  HGB 10.7*   < > 10.4*  --  10.4*  --   ALBUMIN 2.8*  --   --  2.6*  --   --   CALCIUM 8.3*   < >  --  8.4*  --  8.4*  PHOS  --   --   --  6.8*  --   --   CREATININE 4.97*   < > 6.12* 6.22*  --  4.47*  K 4.5   < >  --  4.4  --  4.3   < > = values in this interval  not displayed.    No results for input(s): "IRON", "TIBC", "FERRITIN" in the last 168 hours. Inpatient medications:  budesonide (PULMICORT) nebulizer solution  0.25 mg Nebulization BID   busPIRone  5 mg Oral TID   Chlorhexidine Gluconate Cloth  6 each Topical Q0600   Chlorhexidine Gluconate Cloth  6 each Topical Q0600   dextromethorphan-guaiFENesin  1 tablet Oral BID   diltiazem  60 mg Oral Q6H   heparin  5,000 Units Subcutaneous Q8H   levalbuterol  0.63 mg Nebulization Q6H   And   ipratropium  0.5 mg Nebulization Q6H   levothyroxine  75 mcg Oral q AM   methylPREDNISolone (SOLU-MEDROL) injection  40 mg Intravenous Q12H   metoprolol tartrate  25 mg Oral Q8H   mouth rinse  15 mL Mouth  Rinse 4 times per day   pantoprazole (PROTONIX) IV  40 mg Intravenous Q12H   polyethylene glycol  17 g Oral Daily   pravastatin  40 mg Oral QHS   senna  8.6 mg Oral BID    ceFEPime (MAXIPIME) IV 1 g (01/23/22 1342)   acetaminophen, albuterol, hydrALAZINE, hydrOXYzine, LORazepam, metoprolol tartrate

## 2022-01-24 NOTE — Progress Notes (Signed)
PROGRESS NOTE        PATIENT DETAILS Name: Wayne Meadows Age: 87 y.o. Sex: male Date of Birth: 09-24-1934 Admit Date: 01/20/2022 Admitting Physician Collene Gobble, MD SH[id number], [name]  Brief Summary: Patient is a 87 y.o.  male with history of COPD on home O2, ESRD on HD MWF, PAF-not on anticoagulation-presented to hospital with shortness of breath-he was found to have acute on chronic hypoxic respiratory failure requiring BiPAP-due to a combination of pulm edema/from missed hemodialysis with some contribution from his underlying COPD.  He was briefly monitored in the [facility] transferred to [facility] on 12/1.  Significant events: 11/28>> presented with SOB-on BiPAP-felt to have pulm edema due to missed HD/COPD exacerbation..  Unable to be liberated off BiPAP even after HD x 1. 12/1>> transfer to [facility]-transient episode of A-fib RVR-spontaneously converted to sinus rhythm.  Significant studies: 11/28>> CXR: Increased pulm edema, unchanged moderate left-sided pleural effusion 11/29>> CXR: Stable large left pleural effusion. 11/29>> echo: EF 58-85%, RV systolic function normal.  Significant microbiology data: 11/28>> COVID/influenza PCR: Negative  Procedures: None  Consults: PCCM  Subjective: Patient in bed, appears comfortable, denies any headache, no fever, no chest pain or pressure, no shortness of breath , no abdominal pain. No new focal weakness.  Objective: Vitals: Blood pressure (!) 146/51, pulse 67, temperature 98.4 F (36.9 C), temperature source Oral, resp. rate 18, height 6' (1.829 m), weight 70.5 kg, SpO2 99 %.   Exam:  Awake , mildly confused, No new F.N deficits, Normal affect [place].AT,PERRAL Supple Neck, No JVD,   Symmetrical Chest wall movement, Good air movement bilaterally, CTAB RRR,No Gallops, Rubs or new Murmurs,  +ve B.Sounds, Abd Soft, No tenderness,   No Cyanosis, Clubbing or edema    Assessment/Plan: Acute on  chronic hypoxic respiratory failure (on 2-3 L of oxygen at home) with COPD exacerbation and acute on diastolic chronic CHF EF 02%.  Multifactorial etiology-pulm edema from missed HD x 1, COPD exacerbation, possible PNA, was on BiPAP for multiple days now on a as needed basis since 01/24/2022, fluid bleeding removed via dialysis, anxiety also playing a role.  Continue steroid taper, supportive care along with HD for fluid removal and monitor.  PAF RVR Brief episode of RVR this morning-spontaneously converted to sinus rhythm Reviewed prior cardiology note-not on anticoagulation due to history of GI bleeding/fall risk (CHA2DS2-VASc 5),  continue combination of Cardizem and beta-blocker and monitor on telemetry.   Chronic left-sided pleural effusion Suspect transudative-likely related to HD/possible diastolic CHF If unable to be liberated off the BiPAP-in spite of multiple HD sessions-May need thoracocentesis/CT chest.  Anxiety Playing a big role in his symptomatology Apparently is on BuSpar at home-but since he is on BiPAP-will use IV Ativan as needed.  ESRD on HD MWF See above regarding volume overload Nephrology following and directing HD care  Hx of CAD-s/p PCI to RCA 2011 Hx of CVA Hx of PAD Stable-continue statin Suspect not on an platelets due to history of recurrent GI bleeding  HLD Continue statin  Hypothyroidism Synthroid  History of gastric/duodenal ulcers Barrett's esophagus No active GI bleeding noted On PPI  Palliative care Palliative care following for goals of care.  Underweight: Estimated body mass index is 21.08 kg/m as calculated from the following:   Height as of this encounter: 6' (1.829 m).   Weight as of this encounter:  70.5 kg.   Code status:   Code Status: Full Code   DVT Prophylaxis: heparin injection 5,000 Units Start: 01/20/22 2200   Family Communication: Daughter-Tammy [phone] -updated over the phone 12//2/23   Disposition  Plan: Status is: Inpatient Remains inpatient appropriate because: Severity of illness   Planned Discharge Destination:Skilled nursing facility   Diet: Diet Order             Diet regular Room service appropriate? Yes; Fluid consistency: Thin  Diet effective now                    MEDICATIONS: Scheduled Meds:  budesonide (PULMICORT) nebulizer solution  0.25 mg Nebulization BID   busPIRone  5 mg Oral TID   Chlorhexidine Gluconate Cloth  6 each Topical Q0600   Chlorhexidine Gluconate Cloth  6 each Topical Q0600   dextromethorphan-guaiFENesin  1 tablet Oral BID   diltiazem  60 mg Oral Q6H   heparin  5,000 Units Subcutaneous Q8H   levalbuterol  0.63 mg Nebulization Q6H   And   ipratropium  0.5 mg Nebulization Q6H   levothyroxine  75 mcg Oral q AM   methylPREDNISolone (SOLU-MEDROL) injection  40 mg Intravenous Q12H   metoprolol tartrate  25 mg Oral Q8H   mouth rinse  15 mL Mouth Rinse 4 times per day   pantoprazole (PROTONIX) IV  40 mg Intravenous Q12H   polyethylene glycol  17 g Oral Daily   pravastatin  40 mg Oral QHS   senna  8.6 mg Oral BID   Continuous Infusions:  ceFEPime (MAXIPIME) IV 1 g (01/23/22 1342)   vancomycin     PRN Meds:.acetaminophen, albuterol, hydrALAZINE, hydrOXYzine, LORazepam, metoprolol tartrate   I have personally reviewed following labs and imaging studies  LABORATORY DATA:  Recent Labs  Lab 01/20/22 1550 01/20/22 1617 01/21/22 0610 01/22/22 0550 01/23/22 0524 01/23/22 1134 01/24/22 0149  WBC 13.4*  --  10.1 10.6* 8.7 9.9 8.1  HGB 10.7*   < > 10.7* 10.1* 10.1* 10.4* 10.4*  HCT 34.4*   < > 33.4* 32.5* 30.9* 33.7* 32.8*  PLT 463*  --  349 256 279 301 316  MCV 98.3  --  96.3 96.4 93.9 96.8 94.5  MCH 30.6  --  30.8 30.0 30.7 29.9 30.0  MCHC 31.1  --  32.0 31.1 32.7 30.9 31.7  RDW 15.1  --  15.0 15.4 15.5 15.6* 15.3  LYMPHSABS 0.5*  --   --   --   --   --   --   MONOABS 0.1  --   --   --   --   --   --   EOSABS 0.1  --   --   --    --   --   --   BASOSABS 0.1  --   --   --   --   --   --    < > = values in this interval not displayed.    Recent Labs  Lab 01/20/22 1550 01/20/22 1617 01/20/22 1623 01/21/22 0604 01/21/22 0610 01/22/22 0550 01/23/22 1134 01/23/22 1425 01/24/22 0710  NA 132*   < > 128* 132* 131* 137  --  137 136  K 6.3*   < > 5.7* 4.4 4.5 4.1  --  4.4 4.3  CL 91*  --  96*  --  91* 93*  --  98 96*  CO2 23  --   --   --  22 26  --  23 20*  GLUCOSE 112*  --  120*  --  104* 115*  --  86 84  BUN 53*  --  55*  --  29* 29*  --  59* 42*  CREATININE 7.91*  --  8.40*  --  4.97* 4.09* 6.12* 6.22* 4.47*  AST 15  --   --   --  17  --   --   --   --   ALT 11  --   --   --  12  --   --   --   --   ALKPHOS 122  --   --   --  113  --   --   --   --   BILITOT 2.2*  --   --   --  1.8*  --   --   --   --   ALBUMIN 3.1*  --   --   --  2.8*  --   --  2.6*  --   CRP  --   --   --   --   --   --   --   --  6.4*  PROCALCITON  --   --   --   --   --   --   --   --  1.32  TSH  --   --   --   --  4.573*  --   --   --   --   BNP 2,754.5*  --   --   --   --   --   --   --  3,340.8*  MG  --   --   --   --  1.9 1.9  --   --  1.8  CALCIUM 8.8*  --   --   --  8.3* 8.8*  --  8.4* 8.4*   < > = values in this interval not displayed.      RADIOLOGY STUDIES/RESULTS: DG Chest Port 1 View  Result Date: 01/24/2022 CLINICAL DATA:  87 year old male with history of shortness of breath. EXAM: PORTABLE CHEST 1 VIEW COMPARISON:  Chest x-ray 01/23/2022. FINDINGS: Small right and moderate left pleural effusions. Bibasilar opacities (left-greater-than-right), compatible with areas of atelectasis and/or consolidation. There is cephalization of the pulmonary vasculature and slight indistinctness of the interstitial markings suggestive of mild pulmonary edema. Mild cardiomegaly. Upper mediastinal contours are within normal limits. Atherosclerotic calcifications are noted in the thoracic aorta. IMPRESSION: 1. The appearance the chest is most  suggestive of congestive heart failure, as above. 2. Bibasilar opacities are favored to predominantly reflect areas of atelectasis, however, underlying airspace consolidation (particularly in the left lower lobe) is not excluded. 3. Aortic atherosclerosis. Electronically Signed   By: Vinnie Langton M.D.   On: 01/24/2022 09:00   DG Chest Port 1 View  Result Date: 01/23/2022 CLINICAL DATA:  Shortness of breath EXAM: PORTABLE CHEST 1 VIEW COMPARISON:  Two days ago FINDINGS: Allowing for redistribution similar appearance of moderate to large left pleural effusion. Hazy pulmonary density of both lung bases. No pneumothorax. Normal heart size and stable mediastinal contours. IMPRESSION: At least moderate left pleural effusion that is similar to prior when allowing for redistribution. Stable pulmonary opacity usually from atelectasis. Electronically Signed   By: Jorje Guild M.D.   On: 01/23/2022 11:39     LOS: 4 days   Signature  -    Lala Lund M.D on 01/24/2022 at 9:18 AM   -  To page go to [email or web address]

## 2022-01-25 DIAGNOSIS — Z515 Encounter for palliative care: Secondary | ICD-10-CM | POA: Diagnosis not present

## 2022-01-25 LAB — CBC WITH DIFFERENTIAL/PLATELET
Abs Immature Granulocytes: 0.03 10*3/uL (ref 0.00–0.07)
Basophils Absolute: 0 10*3/uL (ref 0.0–0.1)
Basophils Relative: 0 %
Eosinophils Absolute: 0 10*3/uL (ref 0.0–0.5)
Eosinophils Relative: 0 %
HCT: 35.6 % — ABNORMAL LOW (ref 39.0–52.0)
Hemoglobin: 11.3 g/dL — ABNORMAL LOW (ref 13.0–17.0)
Immature Granulocytes: 1 %
Lymphocytes Relative: 6 %
Lymphs Abs: 0.4 10*3/uL — ABNORMAL LOW (ref 0.7–4.0)
MCH: 30.1 pg (ref 26.0–34.0)
MCHC: 31.7 g/dL (ref 30.0–36.0)
MCV: 94.7 fL (ref 80.0–100.0)
Monocytes Absolute: 0.2 10*3/uL (ref 0.1–1.0)
Monocytes Relative: 4 %
Neutro Abs: 5.3 10*3/uL (ref 1.7–7.7)
Neutrophils Relative %: 89 %
Platelets: 317 10*3/uL (ref 150–400)
RBC: 3.76 MIL/uL — ABNORMAL LOW (ref 4.22–5.81)
RDW: 15 % (ref 11.5–15.5)
WBC: 5.9 10*3/uL (ref 4.0–10.5)
nRBC: 0 % (ref 0.0–0.2)

## 2022-01-25 LAB — BASIC METABOLIC PANEL
Anion gap: 19 — ABNORMAL HIGH (ref 5–15)
BUN: 29 mg/dL — ABNORMAL HIGH (ref 8–23)
CO2: 24 mmol/L (ref 22–32)
Calcium: 8.7 mg/dL — ABNORMAL LOW (ref 8.9–10.3)
Chloride: 94 mmol/L — ABNORMAL LOW (ref 98–111)
Creatinine, Ser: 3.47 mg/dL — ABNORMAL HIGH (ref 0.61–1.24)
GFR, Estimated: 17 mL/min — ABNORMAL LOW (ref >60)
Glucose, Bld: 136 mg/dL — ABNORMAL HIGH (ref 70–99)
Potassium: 3.7 mmol/L (ref 3.5–5.1)
Sodium: 137 mmol/L (ref 135–145)

## 2022-01-25 LAB — GLUCOSE, CAPILLARY
Glucose-Capillary: 178 mg/dL — ABNORMAL HIGH (ref 70–99)
Glucose-Capillary: 150 mg/dL — ABNORMAL HIGH (ref 70–99)
Glucose-Capillary: 183 mg/dL — ABNORMAL HIGH (ref 70–99)
Glucose-Capillary: 201 mg/dL — ABNORMAL HIGH (ref 70–99)
Glucose-Capillary: 177 mg/dL — ABNORMAL HIGH (ref 70–99)

## 2022-01-25 LAB — BRAIN NATRIURETIC PEPTIDE: B Natriuretic Peptide: 3036.8 pg/mL — ABNORMAL HIGH (ref 0.0–100.0)

## 2022-01-25 LAB — C-REACTIVE PROTEIN: CRP: 3.9 mg/dL — ABNORMAL HIGH (ref <1.0)

## 2022-01-25 LAB — MAGNESIUM: Magnesium: 1.8 mg/dL (ref 1.7–2.4)

## 2022-01-25 LAB — PROCALCITONIN: Procalcitonin: 1.11 ng/mL

## 2022-01-25 MED ORDER — HEPARIN SODIUM (PORCINE) 1000 UNIT/ML DIALYSIS: 1000.0000 [IU] | INTRAMUSCULAR | Status: DC | PRN

## 2022-01-25 MED ORDER — ALTEPLASE 2 MG IJ SOLR: 2.0000 mg | Freq: Once | INTRAMUSCULAR | Status: DC | PRN

## 2022-01-25 MED ORDER — PENTAFLUOROPROP-TETRAFLUOROETH EX AERO: 1.0000 | INHALATION_SPRAY | CUTANEOUS | Status: DC | PRN

## 2022-01-25 MED ORDER — HYDRALAZINE HCL 50 MG PO TABS
50.0000 mg | ORAL_TABLET | Freq: Three times a day (TID) | ORAL | Status: DC
  Administered 2022-01-25 – 2022-01-27 (×5): 50 mg via ORAL
  Filled 2022-01-25 (×5): qty 1

## 2022-01-25 MED ORDER — MELATONIN 3 MG PO TABS
3.0000 mg | ORAL_TABLET | Freq: Every evening | ORAL | Status: DC | PRN
  Administered 2022-01-26: 3 mg via ORAL
  Filled 2022-01-25: qty 1

## 2022-01-25 MED ORDER — INSULIN ASPART 100 UNIT/ML IJ SOLN
0.0000 [IU] | Freq: Three times a day (TID) | INTRAMUSCULAR | Status: DC
  Administered 2022-01-26: 1 [IU] via SUBCUTANEOUS
  Administered 2022-01-26 – 2022-01-27 (×2): 2 [IU] via SUBCUTANEOUS
  Administered 2022-01-27: 1 [IU] via SUBCUTANEOUS
  Administered 2022-01-27: 3 [IU] via SUBCUTANEOUS

## 2022-01-25 MED ORDER — LIDOCAINE-PRILOCAINE 2.5-2.5 % EX CREA: 1.0000 | TOPICAL_CREAM | CUTANEOUS | Status: DC | PRN

## 2022-01-25 MED ORDER — ONDANSETRON HCL 4 MG/2ML IJ SOLN
4.0000 mg | Freq: Four times a day (QID) | INTRAMUSCULAR | Status: DC | PRN
  Administered 2022-01-25 – 2022-01-26 (×2): 4 mg via INTRAVENOUS
  Filled 2022-01-25 (×2): qty 2

## 2022-01-25 MED ORDER — LIDOCAINE HCL (PF) 1 % IJ SOLN: 5.0000 mL | INTRAMUSCULAR | Status: DC | PRN

## 2022-01-25 MED ORDER — METHYLPREDNISOLONE SODIUM SUCC 40 MG IJ SOLR
40.0000 mg | Freq: Every day | INTRAMUSCULAR | Status: DC
  Administered 2022-01-27: 40 mg via INTRAVENOUS
  Filled 2022-01-25: qty 1

## 2022-01-25 MED ORDER — VANCOMYCIN VARIABLE DOSE PER UNSTABLE RENAL FUNCTION (PHARMACIST DOSING): Status: DC

## 2022-01-25 NOTE — Progress Notes (Signed)
Palliative Medicine Inpatient Follow Up Note HPI: Patient is a 87 y.o.  male with history of COPD on home O2, ESRD on HD MWF, PAF-not on anticoagulation-presented to hospital with shortness of breath-he was found to have acute on chronic hypoxic respiratory failure requiring BiPAP-due to a combination of pulm edema/from missed hemodialysis with some contribution from his underlying COPD.  He was briefly monitored in the [place] transferred to [facility] on 12/1.    Palliative care has been asked to get involved in the setting of chronic frailty to discuss goals of care.   Today's Discussion 01/25/2022  *Please note that this is a verbal dictation therefore any spelling or grammatical errors are due to the "[employer] [employer]" system interpretation.  Chart reviewed inclusive of vital signs, progress notes, laboratory results, and diagnostic images.   I met with Gwyndolyn Saxon at bedside this morning.  He is in good spirits despite having slept poorly these last three nights. We discussed the goals at this time to remain the course of treatment and get back to [facility]. Patients expressed disappointment that he has not been strong enough to get home though understands this may not be a real possibility given his deficits.  I spoke with patients daughter, [name] over the phone. She shares that patients is improving per her SIL who was able to see the patient. She is apprehensive about a meeting today as she worries it may diminish patients spirits. We decided that for now since he is improving we can hold off. Family had already met and are aware if her declines further or is incoherent that they will need to step in and make decisions.   I asked if OP Palliative support would be reasonable to arrange which family is in agreement with.   Questions and concerns addressed/Palliative Support Provided.   Objective Assessment: Vital Signs Vitals:   01/25/22 0757 01/25/22 0816  BP: (!) 166/58   Pulse: 67    Resp: 15   Temp: 98.2 F (36.8 C)   SpO2: 96% 93%    Intake/Output Summary (Last 24 hours) at 01/25/2022 1029 Last data filed at 01/24/2022 2140 Gross per 24 hour  Intake --  Output 5800 ml  Net -5800 ml    Last Weight  Most recent update: 01/24/2022 10:12 PM    Weight  67.8 kg (149 lb 7.6 oz)            Gen:  Elderly Caucasian M in moderate distress HEENT: Dry mucous membranes CV: Regular rate and irregular rhythm  PULM: On Bipap ABD: soft/nontender EXT: No edema Neuro: Alert and oriented x2  SUMMARY OF RECOMMENDATIONS   FULL CODE --> strongly encourage consideration of DNAR given patient's underlying comorbidities and generalized frailty  Plan for OP Palliative support on discharge   Ongoing incremental palliative care support  Billing based on MDM: High  ______________________________________________________________________________________ [facility] Team Team Cell Phone: [phone] Please utilize secure chat with additional questions, if there is no response within 30 minutes please call the above phone number  Palliative Medicine Team providers are available by phone from 7am to 7pm daily and can be reached through the team cell phone.  Should this patient require assistance outside of these hours, please call the patient's attending physician.

## 2022-01-25 NOTE — Progress Notes (Addendum)
[facility] Progress Note   Subjective:    Seen and examined patient at bedside. Received an extra HD yesterday and tolerated net UF 2.7L. He denies SOB, CP, and N/V. Next HD 12/4.  Objective Vitals:   01/25/22 0000 01/25/22 0300 01/25/22 0757 01/25/22 0816  BP: (!) 135/50 (!) 135/50 (!) 166/58   Pulse: 66 72 67   Resp: '18 15 15   '$ Temp: 98.2 F (36.8 C) 98.8 F (37.1 C) 98.2 F (36.8 C)   TempSrc: Oral Oral Oral   SpO2: 95% 94% 96% 93%  Weight:      Height:       Physical Exam General: Awake, alert, NAD Heart: S1 and S2; No MRGs Lungs: Clear anteriorly/laterally; No wheezing, rhonchi, or rales Abdomen: Soft and non-tender Extremities: No LE edema Dialysis Access: R AVF (+) B/T   Filed Weights   01/23/22 1747 01/24/22 0500 01/24/22 2140  Weight: 70.1 kg 70.5 kg 67.8 kg    Intake/Output Summary (Last 24 hours) at 01/25/2022 1012 Last data filed at 01/24/2022 2140 Gross per 24 hour  Intake --  Output 5800 ml  Net -5800 ml    Additional Objective Labs: Basic Metabolic Panel: Recent Labs  Lab 01/23/22 1425 01/24/22 0710 01/25/22 0222  NA 137 136 137  K 4.4 4.3 3.7  CL 98 96* 94*  CO2 23 20* 24  GLUCOSE 86 84 136*  BUN 59* 42* 29*  CREATININE 6.22* 4.47* 3.47*  CALCIUM 8.4* 8.4* 8.7*  PHOS 6.8*  --   --    Liver Function Tests: Recent Labs  Lab 01/20/22 1550 01/21/22 0610 01/23/22 1425  AST 15 17  --   ALT 11 12  --   ALKPHOS 122 113  --   BILITOT 2.2* 1.8*  --   PROT 6.9 6.6  --   ALBUMIN 3.1* 2.8* 2.6*   No results for input(s): "LIPASE", "AMYLASE" in the last 168 hours. CBC: Recent Labs  Lab 01/20/22 1550 01/20/22 1617 01/22/22 0550 01/23/22 0524 01/23/22 1134 01/24/22 0149 01/25/22 0222  WBC 13.4*   < > 10.6* 8.7 9.9 8.1 5.9  NEUTROABS 12.5*  --   --   --   --   --  5.3  HGB 10.7*   < > 10.1* 10.1* 10.4* 10.4* 11.3*  HCT 34.4*   < > 32.5* 30.9* 33.7* 32.8* 35.6*  MCV 98.3   < > 96.4 93.9 96.8 94.5 94.7  PLT 463*   <  > 256 279 301 316 317   < > = values in this interval not displayed.   Blood Culture    Component Value Date/Time   SDES BLOOD LEFT ARM 10/22/2021 0318   SPECREQUEST  10/22/2021 0318    BOTTLES DRAWN AEROBIC AND ANAEROBIC Blood Culture adequate volume   CULT  10/22/2021 0318    NO GROWTH 5 DAYS Performed at [facility], [place] [address] [place], [place] [address]    REPTSTATUS 10/27/2021 FINAL 10/22/2021 6295    Cardiac Enzymes: No results for input(s): "CKTOTAL", "CKMB", "CKMBINDEX", "TROPONINI" in the last 168 hours. CBG: Recent Labs  Lab 01/24/22 1205 01/24/22 1634 01/24/22 2329 01/25/22 0331 01/25/22 0802  GLUCAP 84 90 120* 178* 150*   Iron Studies: No results for input(s): "IRON", "TIBC", "TRANSFERRIN", "FERRITIN" in the last 72 hours. Lab Results  Component Value Date   INR 1.2 11/05/2021   INR 1.1 08/13/2021   INR 1.1 04/18/2021   Studies/Results: [facility] [address]  Result Date: 01/24/2022 CLINICAL DATA:  87 year old male with history of shortness of breath. EXAM: PORTABLE CHEST 1 VIEW COMPARISON:  Chest x-ray 01/23/2022. FINDINGS: Small right and moderate left pleural effusions. Bibasilar opacities (left-greater-than-right), compatible with areas of atelectasis and/or consolidation. There is cephalization of the pulmonary vasculature and slight indistinctness of the interstitial markings suggestive of mild pulmonary edema. Mild cardiomegaly. Upper mediastinal contours are within normal limits. Atherosclerotic calcifications are noted in the thoracic aorta. IMPRESSION: 1. The appearance the chest is most suggestive of congestive heart failure, as above. 2. Bibasilar opacities are favored to predominantly reflect areas of atelectasis, however, underlying airspace consolidation (particularly in the left lower lobe) is not excluded. 3. Aortic atherosclerosis. Electronically Signed   By: Vinnie Langton M.D.   On: 01/24/2022 09:00   DG Chest Port 1 View  Result  Date: 01/23/2022 CLINICAL DATA:  Shortness of breath EXAM: PORTABLE CHEST 1 VIEW COMPARISON:  Two days ago FINDINGS: Allowing for redistribution similar appearance of moderate to large left pleural effusion. Hazy pulmonary density of both lung bases. No pneumothorax. Normal heart size and stable mediastinal contours. IMPRESSION: At least moderate left pleural effusion that is similar to prior when allowing for redistribution. Stable pulmonary opacity usually from atelectasis. Electronically Signed   By: Jorje Guild M.D.   On: 01/23/2022 11:39    Medications:  ceFEPime (MAXIPIME) IV Stopped (01/24/22 1323)    budesonide (PULMICORT) nebulizer solution  0.25 mg Nebulization BID   busPIRone  5 mg Oral TID   Chlorhexidine Gluconate Cloth  6 each Topical Q0600   dextromethorphan-guaiFENesin  1 tablet Oral BID   diltiazem  60 mg Oral Q6H   heparin  5,000 Units Subcutaneous Q8H   hydrALAZINE  50 mg Oral Q8H   levalbuterol  0.63 mg Nebulization Q6H   And   ipratropium  0.5 mg Nebulization Q6H   levothyroxine  75 mcg Oral q AM   methylPREDNISolone (SOLU-MEDROL) injection  40 mg Intravenous Q12H   metoprolol tartrate  25 mg Oral Q8H   mouth rinse  15 mL Mouth Rinse 4 times per day   pantoprazole (PROTONIX) IV  40 mg Intravenous Q12H   polyethylene glycol  17 g Oral Daily   pravastatin  40 mg Oral QHS   senna  8.6 mg Oral BID    Dialysis Orders: [facility]-MWF 4h  400/1.5   72.5kg  3K/2.5Ca bath   Hep 3000  RUA AVF - last HD 11/24, post 74kg - mircera 75 ug q4, last 11/21 - no vdra  Assessment/Plan: Resp distress - pulm edema by CXR +/- COPD +/- pna. IV abx per pmd. Pt missed HD x one. Pulm edema (mild) per initial CXR. Had HD here 3 times, is down 5kg. Needs fluid restriction and I changed to renal diet. Received extra HD yesterday, further UF as tol.  ESRD - usual HD MWF. Missed HD x 1. HD today off schedule, extra HD for volume yesterday. Post weight yesterday was 67.8kg. More likely will lower  EDW at dc. Next HD 12/4. Atrial fib - getting po BB and cardizem, HR 80s- 90s.  COPD - per pmd  HTN - BP's seem okay MBD ckd - Ca ok, will check PO4 in AM Anemia ckd - Hb 11.3, outpt esa not due for another 3 wks. ESA not indicated for now. Monitor trend. GOC - frequent admissions, cachectic, frail, elderly , esrd and not doing well acutely, will pallative care consulted.   Tobie Poet, NP [facility] 01/25/2022,10:12  AM  LOS: 5 days

## 2022-01-25 NOTE — Progress Notes (Signed)
[facility] night cross cover note:   I was notified by RN of the patient's request for a sleep aid, with patient noting that he takes melatonin at home. I subsequently placed order for prn melatonin for insomnia.     Babs Bertin, DO Hospitalist

## 2022-01-25 NOTE — Progress Notes (Signed)
PROGRESS NOTE        PATIENT DETAILS Name: Wayne Meadows Age: 87 y.o. Sex: male Date of Birth: 1934-03-23 Admit Date: 01/20/2022 Admitting Physician Collene Gobble, MD [id number], [name]  Brief Summary: Patient is a 87 y.o.  male with history of COPD on home O2, ESRD on HD MWF, PAF-not on anticoagulation-presented to hospital with shortness of breath-he was found to have acute on chronic hypoxic respiratory failure requiring BiPAP-due to a combination of pulm edema/from missed hemodialysis with some contribution from his underlying COPD.  He was briefly monitored in the [facility] transferred to [facility] on 12/1.  Significant events: 11/28>> presented with SOB-on BiPAP-felt to have pulm edema due to missed HD/COPD exacerbation..  Unable to be liberated off BiPAP even after HD x 1. 12/1>> transfer to [facility]-transient episode of A-fib RVR-spontaneously converted to sinus rhythm.  Significant studies: 11/28>> CXR: Increased pulm edema, unchanged moderate left-sided pleural effusion 11/29>> CXR: Stable large left pleural effusion. 11/29>> echo: EF 83-41%, RV systolic function normal.  Significant microbiology data: 11/28>> COVID/influenza PCR: Negative  Procedures: None  Consults: PCCM, Renal  Subjective: Patient in bed, appears comfortable, denies any headache, no fever, no chest pain or pressure, no shortness of breath , no abdominal pain. No new focal weakness.  Objective: Vitals: Blood pressure (!) 166/58, pulse 67, temperature 98.2 F (36.8 C), temperature source Oral, resp. rate 15, height 6' (1.829 m), weight 67.8 kg, SpO2 93 %.   Exam:  Awake Alert, No new F.N deficits, Normal affect [place].AT,PERRAL Supple Neck, No JVD,   Symmetrical Chest wall movement, Good air movement bilaterally, CTAB RRR,No Gallops, Rubs or new Murmurs,  +ve B.Sounds, Abd Soft, No tenderness,   No Cyanosis, Clubbing or edema    Assessment/Plan:  Acute on chronic  hypoxic respiratory failure (on 2-3 L of oxygen at home) with COPD exacerbation and acute on diastolic chronic CHF EF 96% -   Multifactorial etiology-pulm edema from missed HD x 1, COPD exacerbation, possible PNA, was on BiPAP for multiple days now on a as needed basis since 01/24/2022, fluid bleeding removed via dialysis, anxiety also playing a role.  Continue steroid taper, supportive care along with HD for fluid removal and monitor, improving.  PAF RVR - Brief episode of RVR this morning-spontaneously converted to sinus rhythm, Reviewed prior cardiology note-not on anticoagulation due to history of GI bleeding/fall risk (CHA2DS2-VASc 5),  continue combination of Cardizem and beta-blocker and monitor on telemetry.  Chronic left-sided pleural effusion - Suspect transudative-likely related to HD/possible diastolic CHF, If unable to be liberated off the BiPAP-in spite of multiple HD sessions-May need thoracocentesis/CT chest.  Anxiety - Playing a big role in his symptomatology, apparently is on BuSpar at home-but since he is on BiPAP-will use IV Ativan as needed.  ESRD on HD MWF - See above regarding volume overload, Nephrology following and directing HD care  Hx of CAD-s/p PCI to RCA 2011, Hx of CVA, Hx of PAD - Stable-continue statin, suspect not on an platelets due to history of recurrent GI bleeding  HTN - on Cardizem, added Hydralazine.  HLD - Continue statin  Hypothyroidism - Synthroid  History of gastric/duodenal ulcers, Barrett's esophagus - No active GI bleeding noted, On PPI   Underweight: Estimated body mass index is 20.27 kg/m as calculated from the following:   Height as of this encounter: 6' (1.829  m).   Weight as of this encounter: 67.8 kg.   Code status:   Code Status: Full Code   DVT Prophylaxis: heparin injection 5,000 Units Start: 01/20/22 2200   Family Communication: Daughter-Tammy [phone] -updated over the phone 12//2/23   Disposition Plan: Status is:  Inpatient Remains inpatient appropriate because: Severity of illness   Planned Discharge Destination:Skilled nursing facility   Diet: Diet Order             Diet renal with fluid restriction Fluid restriction: 1200 mL Fluid; Room service appropriate? Yes; Fluid consistency: Thin  Diet effective now                    MEDICATIONS: Scheduled Meds:  budesonide (PULMICORT) nebulizer solution  0.25 mg Nebulization BID   busPIRone  5 mg Oral TID   Chlorhexidine Gluconate Cloth  6 each Topical Q0600   dextromethorphan-guaiFENesin  1 tablet Oral BID   diltiazem  60 mg Oral Q6H   heparin  5,000 Units Subcutaneous Q8H   levalbuterol  0.63 mg Nebulization Q6H   And   ipratropium  0.5 mg Nebulization Q6H   levothyroxine  75 mcg Oral q AM   methylPREDNISolone (SOLU-MEDROL) injection  40 mg Intravenous Q12H   metoprolol tartrate  25 mg Oral Q8H   mouth rinse  15 mL Mouth Rinse 4 times per day   pantoprazole (PROTONIX) IV  40 mg Intravenous Q12H   polyethylene glycol  17 g Oral Daily   pravastatin  40 mg Oral QHS   senna  8.6 mg Oral BID   Continuous Infusions:  ceFEPime (MAXIPIME) IV Stopped (01/24/22 1323)   PRN Meds:.acetaminophen, albuterol, hydrALAZINE, HYDROcodone-acetaminophen, hydrOXYzine, LORazepam, metoprolol tartrate, naLOXone (NARCAN)  injection, ondansetron (ZOFRAN) IV, mouth rinse   I have personally reviewed following labs and imaging studies  LABORATORY DATA:  Recent Labs  Lab 01/20/22 1550 01/20/22 1617 01/22/22 0550 01/23/22 0524 01/23/22 1134 01/24/22 0149 01/25/22 0222  WBC 13.4*   < > 10.6* 8.7 9.9 8.1 5.9  HGB 10.7*   < > 10.1* 10.1* 10.4* 10.4* 11.3*  HCT 34.4*   < > 32.5* 30.9* 33.7* 32.8* 35.6*  PLT 463*   < > 256 279 301 316 317  MCV 98.3   < > 96.4 93.9 96.8 94.5 94.7  MCH 30.6   < > 30.0 30.7 29.9 30.0 30.1  MCHC 31.1   < > 31.1 32.7 30.9 31.7 31.7  RDW 15.1   < > 15.4 15.5 15.6* 15.3 15.0  LYMPHSABS 0.5*  --   --   --   --   --  0.4*   MONOABS 0.1  --   --   --   --   --  0.2  EOSABS 0.1  --   --   --   --   --  0.0  BASOSABS 0.1  --   --   --   --   --  0.0   < > = values in this interval not displayed.    Recent Labs  Lab 01/20/22 1550 01/20/22 1617 01/21/22 0610 01/22/22 0550 01/23/22 1134 01/23/22 1425 01/24/22 0710 01/25/22 0222  NA 132*   < > 131* 137  --  137 136 137  K 6.3*   < > 4.5 4.1  --  4.4 4.3 3.7  CL 91*   < > 91* 93*  --  98 96* 94*  CO2 23  --  22 26  --  23 20*  24  GLUCOSE 112*   < > 104* 115*  --  86 84 136*  BUN 53*   < > 29* 29*  --  59* 42* 29*  CREATININE 7.91*   < > 4.97* 4.09* 6.12* 6.22* 4.47* 3.47*  AST 15  --  17  --   --   --   --   --   ALT 11  --  12  --   --   --   --   --   ALKPHOS 122  --  113  --   --   --   --   --   BILITOT 2.2*  --  1.8*  --   --   --   --   --   ALBUMIN 3.1*  --  2.8*  --   --  2.6*  --   --   CRP  --   --   --   --   --   --  6.4* 3.9*  PROCALCITON  --   --   --   --   --   --  1.32 1.11  TSH  --   --  4.573*  --   --   --   --   --   BNP 2,754.5*  --   --   --   --   --  3,340.8* 3,036.8*  MG  --   --  1.9 1.9  --   --  1.8 1.8  CALCIUM 8.8*  --  8.3* 8.8*  --  8.4* 8.4* 8.7*   < > = values in this interval not displayed.      RADIOLOGY STUDIES/RESULTS: DG Chest Port 1 View  Result Date: 01/24/2022 CLINICAL DATA:  87 year old male with history of shortness of breath. EXAM: PORTABLE CHEST 1 VIEW COMPARISON:  Chest x-ray 01/23/2022. FINDINGS: Small right and moderate left pleural effusions. Bibasilar opacities (left-greater-than-right), compatible with areas of atelectasis and/or consolidation. There is cephalization of the pulmonary vasculature and slight indistinctness of the interstitial markings suggestive of mild pulmonary edema. Mild cardiomegaly. Upper mediastinal contours are within normal limits. Atherosclerotic calcifications are noted in the thoracic aorta. IMPRESSION: 1. The appearance the chest is most suggestive of congestive heart  failure, as above. 2. Bibasilar opacities are favored to predominantly reflect areas of atelectasis, however, underlying airspace consolidation (particularly in the left lower lobe) is not excluded. 3. Aortic atherosclerosis. Electronically Signed   By: Vinnie Langton M.D.   On: 01/24/2022 09:00   DG Chest Port 1 View  Result Date: 01/23/2022 CLINICAL DATA:  Shortness of breath EXAM: PORTABLE CHEST 1 VIEW COMPARISON:  Two days ago FINDINGS: Allowing for redistribution similar appearance of moderate to large left pleural effusion. Hazy pulmonary density of both lung bases. No pneumothorax. Normal heart size and stable mediastinal contours. IMPRESSION: At least moderate left pleural effusion that is similar to prior when allowing for redistribution. Stable pulmonary opacity usually from atelectasis. Electronically Signed   By: Jorje Guild M.D.   On: 01/23/2022 11:39     LOS: 5 days   Signature  -    Lala Lund M.D on 01/25/2022 at 9:04 AM   -  To page go to [email or web address]

## 2022-01-25 NOTE — Progress Notes (Signed)
[facility] night cross cover note:   I was notified by RN that patient transiently in A fib RVR upon returning from HD, with HR's 110-130's.no new symptoms. BP stable. He received scheduled po BB and prn iv lopressor, with HR's improving into low 100's.   I ordered a STAT EKG at the time of the initial RVR.    Also, patient c/o back pain, chronic, for which he requested home norco 10/325. I resumed norco, but at lower dose/ frequency relative to home.     Babs Bertin, DO Hospitalist

## 2022-01-25 NOTE — Progress Notes (Signed)
   01/24/22 2210  Assess: MEWS Score  Temp 98.5 F (36.9 C)  BP (!) 147/62  MAP (mmHg) 79  Pulse Rate 68  ECG Heart Rate (!) 118  Resp 16  Level of Consciousness Alert  SpO2 94 %  O2 Device Nasal Cannula  Patient Activity (if Appropriate) In bed  O2 Flow Rate (L/min) 4 L/min  Assess: MEWS Score  MEWS Temp 0  MEWS Systolic 0  MEWS Pulse 2  MEWS RR 0  MEWS LOC 0  MEWS Score 2  MEWS Score Color Yellow  Assess: if the MEWS score is Yellow or Red  Were vital signs taken at a resting state? Yes  Focused Assessment Change from prior assessment (see assessment flowsheet)  Does the patient meet 2 or more of the SIRS criteria? No  MEWS guidelines implemented *See Row Information* No, other (Comment)  Notify: Charge Nurse/RN  Name of Charge Nurse/RN Notified Iona Beard  Date Charge Nurse/RN Notified 01/24/22  Time Charge Nurse/RN Notified 2200  Provider Notification  Provider Name/Title Dr.Howerter  Date Provider Notified 01/24/22  Time Provider Notified 2220  Method of Notification  (Secure chat)  Notification Reason New onset of dysrhythmia (A -fib)  Provider response See new orders (STAT EKG.)  Date of Provider Response 01/24/22  Time of Provider Response 2222  Document  Patient Outcome Stabilized after interventions  Assess: SIRS CRITERIA  SIRS Temperature  0  SIRS Pulse 1  SIRS Respirations  0  SIRS WBC 1  SIRS Score Sum  2

## 2022-01-26 DIAGNOSIS — J9601 Acute respiratory failure with hypoxia: Secondary | ICD-10-CM | POA: Diagnosis not present

## 2022-01-26 LAB — PROCALCITONIN: Procalcitonin: 0.78 ng/mL

## 2022-01-26 LAB — CBC WITH DIFFERENTIAL/PLATELET
Abs Immature Granulocytes: 0.07 10*3/uL (ref 0.00–0.07)
Basophils Absolute: 0 10*3/uL (ref 0.0–0.1)
Basophils Relative: 0 %
Eosinophils Absolute: 0 10*3/uL (ref 0.0–0.5)
Eosinophils Relative: 0 %
HCT: 38.4 % — ABNORMAL LOW (ref 39.0–52.0)
Hemoglobin: 12.5 g/dL — ABNORMAL LOW (ref 13.0–17.0)
Immature Granulocytes: 1 %
Lymphocytes Relative: 6 %
Lymphs Abs: 0.4 10*3/uL — ABNORMAL LOW (ref 0.7–4.0)
MCH: 30 pg (ref 26.0–34.0)
MCHC: 32.6 g/dL (ref 30.0–36.0)
MCV: 92.1 fL (ref 80.0–100.0)
Monocytes Absolute: 0.4 10*3/uL (ref 0.1–1.0)
Monocytes Relative: 6 %
Neutro Abs: 6.6 10*3/uL (ref 1.7–7.7)
Neutrophils Relative %: 87 %
Platelets: 329 10*3/uL (ref 150–400)
RBC: 4.17 MIL/uL — ABNORMAL LOW (ref 4.22–5.81)
RDW: 14.8 % (ref 11.5–15.5)
WBC: 7.5 10*3/uL (ref 4.0–10.5)
nRBC: 0 % (ref 0.0–0.2)

## 2022-01-26 LAB — BASIC METABOLIC PANEL
Anion gap: 17 — ABNORMAL HIGH (ref 5–15)
BUN: 58 mg/dL — ABNORMAL HIGH (ref 8–23)
CO2: 25 mmol/L (ref 22–32)
Calcium: 8.6 mg/dL — ABNORMAL LOW (ref 8.9–10.3)
Chloride: 91 mmol/L — ABNORMAL LOW (ref 98–111)
Creatinine, Ser: 5.47 mg/dL — ABNORMAL HIGH (ref 0.61–1.24)
GFR, Estimated: 10 mL/min — ABNORMAL LOW (ref >60)
Glucose, Bld: 128 mg/dL — ABNORMAL HIGH (ref 70–99)
Potassium: 4.5 mmol/L (ref 3.5–5.1)
Sodium: 133 mmol/L — ABNORMAL LOW (ref 135–145)

## 2022-01-26 LAB — BRAIN NATRIURETIC PEPTIDE: B Natriuretic Peptide: 3215.7 pg/mL — ABNORMAL HIGH (ref 0.0–100.0)

## 2022-01-26 LAB — GLUCOSE, CAPILLARY
Glucose-Capillary: 129 mg/dL — ABNORMAL HIGH (ref 70–99)
Glucose-Capillary: 142 mg/dL — ABNORMAL HIGH (ref 70–99)
Glucose-Capillary: 144 mg/dL — ABNORMAL HIGH (ref 70–99)
Glucose-Capillary: 161 mg/dL — ABNORMAL HIGH (ref 70–99)
Glucose-Capillary: 164 mg/dL — ABNORMAL HIGH (ref 70–99)
Glucose-Capillary: 166 mg/dL — ABNORMAL HIGH (ref 70–99)

## 2022-01-26 LAB — HEMOGLOBIN A1C
Hgb A1c MFr Bld: 5.3 % (ref 4.8–5.6)
Mean Plasma Glucose: 105 mg/dL

## 2022-01-26 LAB — MAGNESIUM: Magnesium: 1.9 mg/dL (ref 1.7–2.4)

## 2022-01-26 LAB — C-REACTIVE PROTEIN: CRP: 1.7 mg/dL — ABNORMAL HIGH (ref <1.0)

## 2022-01-26 LAB — VANCOMYCIN, RANDOM: Vancomycin Rm: 12 ug/mL

## 2022-01-26 LAB — PHOSPHORUS: Phosphorus: 5.3 mg/dL — ABNORMAL HIGH (ref 2.5–4.6)

## 2022-01-26 MED ORDER — SEVELAMER CARBONATE 800 MG PO TABS
1600.0000 mg | ORAL_TABLET | Freq: Three times a day (TID) | ORAL | Status: DC
  Administered 2022-01-26 – 2022-01-27 (×4): 1600 mg via ORAL
  Filled 2022-01-26 (×5): qty 2

## 2022-01-26 MED ORDER — HEPARIN SODIUM (PORCINE) 1000 UNIT/ML DIALYSIS
3000.0000 [IU] | Freq: Once | INTRAMUSCULAR | Status: AC
  Administered 2022-01-26: 3000 [IU] via INTRAVENOUS_CENTRAL
  Filled 2022-01-26: qty 3

## 2022-01-26 MED ORDER — PANTOPRAZOLE SODIUM 40 MG PO TBEC
40.0000 mg | DELAYED_RELEASE_TABLET | Freq: Two times a day (BID) | ORAL | Status: DC
  Administered 2022-01-26 – 2022-01-27 (×2): 40 mg via ORAL
  Filled 2022-01-26 (×2): qty 1

## 2022-01-26 MED ORDER — IPRATROPIUM BROMIDE 0.02 % IN SOLN: 0.5000 mg | Freq: Four times a day (QID) | RESPIRATORY_TRACT | Status: DC

## 2022-01-26 MED ORDER — LEVALBUTEROL HCL 0.63 MG/3ML IN NEBU
0.6300 mg | INHALATION_SOLUTION | Freq: Two times a day (BID) | RESPIRATORY_TRACT | Status: DC
  Administered 2022-01-26 – 2022-01-27 (×2): 0.63 mg via RESPIRATORY_TRACT
  Filled 2022-01-26 (×2): qty 3

## 2022-01-26 MED ORDER — LEVALBUTEROL HCL 0.63 MG/3ML IN NEBU: 0.6300 mg | INHALATION_SOLUTION | Freq: Two times a day (BID) | RESPIRATORY_TRACT | Status: DC

## 2022-01-26 MED ORDER — VANCOMYCIN HCL 750 MG/150ML IV SOLN
750.0000 mg | Freq: Once | INTRAVENOUS | Status: AC
  Administered 2022-01-26: 750 mg via INTRAVENOUS
  Filled 2022-01-26: qty 150

## 2022-01-26 MED ORDER — IPRATROPIUM BROMIDE 0.02 % IN SOLN
0.5000 mg | Freq: Two times a day (BID) | RESPIRATORY_TRACT | Status: DC
  Administered 2022-01-26 – 2022-01-27 (×2): 0.5 mg via RESPIRATORY_TRACT
  Filled 2022-01-26 (×2): qty 2.5

## 2022-01-26 NOTE — Progress Notes (Signed)
   01/26/22 1905  Vitals  Temp 98.7 F (37.1 C)  Temp Source Oral  BP (!) 142/48  MAP (mmHg) 75  BP Location Left Arm  BP Method Automatic  Patient Position (if appropriate) Lying  Pulse Rate 94  Pulse Rate Source Monitor  ECG Heart Rate 94  Resp 18  Post Treatment  Dialyzer Clearance Heavily streaked  Duration of HD Treatment -hour(s) 3.9 hour(s)  Liters Processed 71.1  Fluid Removed (mL) 1.7 mL  Tolerated HD Treatment Yes  Post-Hemodialysis Comments came off TX 20 min.s early due to cloting.  AVG/AVF Arterial Site Held (minutes) 5 minutes  AVG/AVF Venous Site Held (minutes) 5 minutes   TX fin. W/ 20 min.s to go, ended due to clotting. Went back on non re breather at 94%.

## 2022-01-26 NOTE — Progress Notes (Signed)
PROGRESS NOTE        PATIENT DETAILS Name: Wayne Meadows Age: 87 y.o. Sex: male Date of Birth: January 19, 1935 Admit Date: 01/20/2022 Admitting Physician Collene Gobble, MD [id number], [name]  Brief Summary: Patient is a 87 y.o.  male with history of COPD on home O2, ESRD on HD MWF, PAF-not on anticoagulation-presented to hospital with shortness of breath-he was found to have acute on chronic hypoxic respiratory failure requiring BiPAP-due to a combination of pulm edema/from missed hemodialysis with some contribution from his underlying COPD.  He was briefly monitored in the [place] transferred to [facility] on 12/1.  Significant events: 11/28>> presented with SOB-on BiPAP-felt to have pulm edema due to missed HD/COPD exacerbation..  Unable to be liberated off BiPAP even after HD x 1. 12/1>> transfer to [facility]-transient episode of A-fib RVR-spontaneously converted to sinus rhythm.  Significant studies: 11/28>> CXR: Increased pulm edema, unchanged moderate left-sided pleural effusion 11/29>> CXR: Stable large left pleural effusion. 11/29>> echo: EF 02-63%, RV systolic function normal.  Significant microbiology data: 11/28>> COVID/influenza PCR: Negative  Procedures: None  Consults: PCCM, Renal  Subjective: Patient in bed, appears comfortable, denies any headache, no fever, no chest pain or pressure, no shortness of breath , no abdominal pain. No new focal weakness.  Objective: Vitals: Blood pressure (!) 148/62, pulse 62, temperature 98 F (36.7 C), temperature source Oral, resp. rate 14, height 6' (1.829 m), weight 67.8 kg, SpO2 93 %.   Exam:  Awake Alert, No new F.N deficits, Normal affect Bath.AT,PERRAL Supple Neck, No JVD,   Symmetrical Chest wall movement, Good air movement bilaterally, CTAB RRR,No Gallops, Rubs or new Murmurs,  +ve B.Sounds, Abd Soft, No tenderness,   No Cyanosis, Clubbing or edema    Assessment/Plan:  Acute on chronic  hypoxic respiratory failure (on 2-3 L of oxygen at home) with COPD exacerbation and acute on diastolic chronic CHF EF 78% -   Multifactorial etiology-pulm edema from missed HD x 1, COPD exacerbation, possible PNA, was on BiPAP for multiple days now on a as needed basis since 01/24/2022, fluid bleeding removed via dialysis, anxiety also playing a role.  Continue steroid taper, supportive care along with HD for fluid removal and monitor, improving.  PAF RVR - Brief episode of RVR this morning-spontaneously converted to sinus rhythm, Reviewed prior cardiology note-not on anticoagulation due to history of GI bleeding/fall risk (CHA2DS2-VASc 5),  continue combination of Cardizem and beta-blocker and monitor on telemetry.  Chronic left-sided pleural effusion - Suspect transudative-likely related to HD/possible diastolic CHF, If unable to be liberated off the BiPAP-in spite of multiple HD sessions-May need thoracocentesis/CT chest.  Anxiety - Playing a big role in his symptomatology, apparently is on BuSpar at home-but since he is on BiPAP-will use IV Ativan as needed.  ESRD on HD MWF - See above regarding volume overload, Nephrology following and directing HD care  Hx of CAD-s/p PCI to RCA 2011, Hx of CVA, Hx of PAD - Stable-continue statin, suspect not on an platelets due to history of recurrent GI bleeding  HTN - on Cardizem, added Hydralazine.  HLD - Continue statin  Hypothyroidism - Synthroid  History of gastric/duodenal ulcers, Barrett's esophagus - No active GI bleeding noted, On PPI   Underweight: Estimated body mass index is 20.27 kg/m as calculated from the following:   Height as of this encounter: 6' (1.829  m).   Weight as of this encounter: 67.8 kg.   Code status:   Code Status: Full Code   DVT Prophylaxis: heparin injection 5,000 Units Start: 01/20/22 2200   Family Communication: Daughter-Tammy [phone] -: On 01/26/2022 message left at 11:06 AM   Disposition Plan: Status  is: Inpatient Remains inpatient appropriate because: Severity of illness   Planned Discharge Destination:Skilled nursing facility   Diet: Diet Order             Diet renal with fluid restriction Fluid restriction: 1200 mL Fluid; Room service appropriate? Yes; Fluid consistency: Thin  Diet effective now                    MEDICATIONS: Scheduled Meds:  budesonide (PULMICORT) nebulizer solution  0.25 mg Nebulization BID   busPIRone  5 mg Oral TID   Chlorhexidine Gluconate Cloth  6 each Topical Q0600   dextromethorphan-guaiFENesin  1 tablet Oral BID   diltiazem  60 mg Oral Q6H   heparin  3,000 Units Dialysis Once in dialysis   heparin  5,000 Units Subcutaneous Q8H   hydrALAZINE  50 mg Oral Q8H   insulin aspart  0-9 Units Subcutaneous TID WC   levalbuterol  0.63 mg Nebulization Q6H   And   ipratropium  0.5 mg Nebulization Q6H   levothyroxine  75 mcg Oral q AM   methylPREDNISolone (SOLU-MEDROL) injection  40 mg Intravenous Daily   metoprolol tartrate  25 mg Oral Q8H   mouth rinse  15 mL Mouth Rinse 4 times per day   pantoprazole  40 mg Oral BID   polyethylene glycol  17 g Oral Daily   pravastatin  40 mg Oral QHS   senna  8.6 mg Oral BID   sevelamer carbonate  1,600 mg Oral TID WC   vancomycin variable dose per unstable renal function (pharmacist dosing)   Does not apply See admin instructions   Continuous Infusions:  ceFEPime (MAXIPIME) IV 1 g (01/25/22 1221)   PRN Meds:.acetaminophen, albuterol, alteplase, heparin, hydrALAZINE, HYDROcodone-acetaminophen, hydrOXYzine, lidocaine (PF), lidocaine-prilocaine, LORazepam, melatonin, metoprolol tartrate, naLOXone (NARCAN)  injection, ondansetron (ZOFRAN) IV, mouth rinse, pentafluoroprop-tetrafluoroeth   I have personally reviewed following labs and imaging studies  LABORATORY DATA:  Recent Labs  Lab 01/20/22 1550 01/20/22 1617 01/23/22 0524 01/23/22 1134 01/24/22 0149 01/25/22 0222 01/26/22 0836  WBC 13.4*   < > 8.7  9.9 8.1 5.9 7.5  HGB 10.7*   < > 10.1* 10.4* 10.4* 11.3* 12.5*  HCT 34.4*   < > 30.9* 33.7* 32.8* 35.6* 38.4*  PLT 463*   < > 279 301 316 317 329  MCV 98.3   < > 93.9 96.8 94.5 94.7 92.1  MCH 30.6   < > 30.7 29.9 30.0 30.1 30.0  MCHC 31.1   < > 32.7 30.9 31.7 31.7 32.6  RDW 15.1   < > 15.5 15.6* 15.3 15.0 14.8  LYMPHSABS 0.5*  --   --   --   --  0.4* 0.4*  MONOABS 0.1  --   --   --   --  0.2 0.4  EOSABS 0.1  --   --   --   --  0.0 0.0  BASOSABS 0.1  --   --   --   --  0.0 0.0   < > = values in this interval not displayed.    Recent Labs  Lab 01/20/22 1550 01/20/22 1617 01/21/22 0610 01/22/22 0550 01/23/22 1134 01/23/22 1425 01/24/22 0710 01/25/22 0222  01/26/22 0836  NA 132*   < > 131* 137  --  137 136 137 133*  K 6.3*   < > 4.5 4.1  --  4.4 4.3 3.7 4.5  CL 91*   < > 91* 93*  --  98 96* 94* 91*  CO2 23  --  22 26  --  23 20* 24 25  GLUCOSE 112*   < > 104* 115*  --  86 84 136* 128*  BUN 53*   < > 29* 29*  --  59* 42* 29* 58*  CREATININE 7.91*   < > 4.97* 4.09* 6.12* 6.22* 4.47* 3.47* 5.47*  AST 15  --  17  --   --   --   --   --   --   ALT 11  --  12  --   --   --   --   --   --   ALKPHOS 122  --  113  --   --   --   --   --   --   BILITOT 2.2*  --  1.8*  --   --   --   --   --   --   ALBUMIN 3.1*  --  2.8*  --   --  2.6*  --   --   --   CRP  --   --   --   --   --   --  6.4* 3.9* 1.7*  PROCALCITON  --   --   --   --   --   --  1.32 1.11  --   TSH  --   --  4.573*  --   --   --   --   --   --   BNP 2,754.5*  --   --   --   --   --  3,340.8* 3,036.8* 3,215.7*  MG  --   --  1.9 1.9  --   --  1.8 1.8 1.9  CALCIUM 8.8*  --  8.3* 8.8*  --  8.4* 8.4* 8.7* 8.6*   < > = values in this interval not displayed.      RADIOLOGY STUDIES/RESULTS: No results found.   LOS: 6 days   Signature  -    Lala Lund M.D on 01/26/2022 at 11:06 AM   -  To page go to [email or web address]

## 2022-01-26 NOTE — Progress Notes (Signed)
Pharmacy Antibiotic Note  Wayne Meadows is a 87 y.o. male with pneumonia.  Pharmacy has been consulted for Vancomycin / Cefepime.  ESRD - usually HD MWF  Vancomycin level 12 this AM   Plan: Vancomycin 750 mg iv x 1 dose after HD today Cefepime 1 gram iv Q 24 hours 5 day antibiotic course completes 01/27/22   Height: 6' (182.9 cm) Weight: 67.8 kg (149 lb 7.6 oz) IBW/kg (Calculated) : 77.6  Temp (24hrs), Avg:98.2 F (36.8 C), Min:97.9 F (36.6 C), Max:98.5 F (36.9 C)  Recent Labs  Lab 01/23/22 0524 01/23/22 1134 01/23/22 1425 01/24/22 0149 01/24/22 0710 01/25/22 0222 01/26/22 0836  WBC 8.7 9.9  --  8.1  --  5.9 7.5  CREATININE  --  6.12* 6.22*  --  4.47* 3.47* 5.47*  VANCORANDOM  --   --   --   --   --   --  12     Estimated Creatinine Clearance: 9.5 mL/min (A) (by C-G formula based on SCr of 5.47 mg/dL (H)).    Allergies  Allergen Reactions   Bee Venom Anaphylaxis   Influenza Vaccines Other (See Comments)    "Mortally sick for 2 weeks"      Thank you for allowing pharmacy to be a part of this patient's care.  Tad Moore 01/26/2022 1:10 PM

## 2022-01-26 NOTE — Progress Notes (Signed)
[facility] Progress Note   Subjective:  Seen in room - says he feels ok but has c/o about food. On O2 2L/hr, his baseline. Denies CP/dyspnea.  Objective Vitals:   01/26/22 0446 01/26/22 0738 01/26/22 0800 01/26/22 0812  BP:  (!) 146/51 (!) 148/62   Pulse:  69 62   Resp:  15 14   Temp:  97.9 F (36.6 C)  98 F (36.7 C)  TempSrc:  Oral  Oral  SpO2:  93% 93%   Weight: 67.8 kg     Height:       Physical Exam General: Frail man, NAD. Nasal O2 in place Heart: RRR; no murmur Lungs: CTA anteriorly, no wheezing or rales Abdomen: soft, non-tender Extremities: no LE edema Dialysis Access: R AVF + bruit  Additional Objective Labs: Basic Metabolic Panel: Recent Labs  Lab 01/23/22 1425 01/24/22 0710 01/25/22 0222  NA 137 136 137  K 4.4 4.3 3.7  CL 98 96* 94*  CO2 23 20* 24  GLUCOSE 86 84 136*  BUN 59* 42* 29*  CREATININE 6.22* 4.47* 3.47*  CALCIUM 8.4* 8.4* 8.7*  PHOS 6.8*  --   --    Liver Function Tests: Recent Labs  Lab 01/20/22 1550 01/21/22 0610 01/23/22 1425  AST 15 17  --   ALT 11 12  --   ALKPHOS 122 113  --   BILITOT 2.2* 1.8*  --   PROT 6.9 6.6  --   ALBUMIN 3.1* 2.8* 2.6*   CBC: Recent Labs  Lab 01/20/22 1550 01/20/22 1617 01/23/22 0524 01/23/22 1134 01/24/22 0149 01/25/22 0222 01/26/22 0836  WBC 13.4*   < > 8.7 9.9 8.1 5.9 7.5  NEUTROABS 12.5*  --   --   --   --  5.3 6.6  HGB 10.7*   < > 10.1* 10.4* 10.4* 11.3* 12.5*  HCT 34.4*   < > 30.9* 33.7* 32.8* 35.6* 38.4*  MCV 98.3   < > 93.9 96.8 94.5 94.7 92.1  PLT 463*   < > 279 301 316 317 329   < > = values in this interval not displayed.  Medications:  ceFEPime (MAXIPIME) IV 1 g (01/25/22 1221)    budesonide (PULMICORT) nebulizer solution  0.25 mg Nebulization BID   busPIRone  5 mg Oral TID   Chlorhexidine Gluconate Cloth  6 each Topical Q0600   dextromethorphan-guaiFENesin  1 tablet Oral BID   diltiazem  60 mg Oral Q6H   heparin  3,000 Units Dialysis Once in dialysis    heparin  5,000 Units Subcutaneous Q8H   hydrALAZINE  50 mg Oral Q8H   insulin aspart  0-9 Units Subcutaneous TID WC   levalbuterol  0.63 mg Nebulization Q6H   And   ipratropium  0.5 mg Nebulization Q6H   levothyroxine  75 mcg Oral q AM   methylPREDNISolone (SOLU-MEDROL) injection  40 mg Intravenous Daily   metoprolol tartrate  25 mg Oral Q8H   mouth rinse  15 mL Mouth Rinse 4 times per day   pantoprazole (PROTONIX) IV  40 mg Intravenous Q12H   polyethylene glycol  17 g Oral Daily   pravastatin  40 mg Oral QHS   senna  8.6 mg Oral BID   vancomycin variable dose per unstable renal function (pharmacist dosing)   Does not apply See admin instructions    Dialysis Orders: [facility]-MWF 4h  400/1.5   72.5kg  3K/2.5Ca bath   Hep 3000  RUA AVF - last HD 11/24, post 74kg -  mircera 75 ug q4, last 11/21 - no vdra  Assessment/Plan: Resp distress: Multifactorial: COPD + HCAP + pulm edema. Now well below his prior dry weight, will lower on HD. Remains on Vanc/Cefepime.  ESRD: Continue HD on MWF schedule - for HD later today. Paroxysmal A-Fib: On BB and cardizem, HR 80s- 90s. No AC d/t Hx GIB and fall risk. COPD - per pmd  HTN: BP stable, continue home meds. Secondary HPTH: Ca ok, Phos a little high. Has not been getting his binders here - will restart home meds (Renvela 2/meals) Anemia of ESRD: Hgb > 12, no ESA needs for now.  GOC: Frequent admits and falls, frail. Palliative care consulted. Family has elected to continue full scope/FULL CODE - but willing to follow-up with palliative care as outpatient.   Veneta Penton, PA-C 01/26/2022, 10:16 AM  [employer]

## 2022-01-26 NOTE — NC FL2 (Signed)
[place] LEVEL OF CARE FORM     IDENTIFICATION  Patient Name: Wayne Meadows Birthdate: 11-08-1934 Sex: male Admission Date (Current Location): 01/20/2022  [facility] and [place] Number:  [name] and Address:  [facility], [place] [address], [place], [place] [address]      Provider Number: [id number]  Attending Physician Name and Address:  Thurnell Lose, MD  Relative Name and Phone Number:       Current Level of Care: Hospital Recommended Level of Care: [place] Prior Approval Number:    Date Approved/Denied:   PASRR Number: [id number]  Discharge Plan: SNF    Current Diagnoses: Patient Active Problem List   Diagnosis Date Noted   Acute respiratory failure with hypoxia ([place]) 01/20/2022   Hematemesis with nausea    Erosive esophagitis    Hiatal hernia    UGI bleed 11/05/2021   Lives in nursing home - [name] 11/05/2021   COVID-19 virus infection 11/05/2021   Thoracic compression fracture, sequela 10/30/2021   Multiple fractures of ribs, bilateral, initial encounter for closed fracture 10/30/2021   Back pain    Chronic respiratory failure with hypoxia (HCC) - 2.5 L/min continuously 09/28/2021   Paroxysmal atrial flutter ([place]) 09/28/2021   ESRD (end stage renal disease) ([place]) 09/11/2021   Gastric ulcer without hemorrhage or perforation    Duodenal ulcer    ESRD (end stage renal disease) on MWF dialysis ([place]) 04/06/2021   Proteinuria 03/07/2021   Acquired hypothyroidism 03/05/2021   Anxiety 03/05/2021   Chronic pancreatitis ([place]) 12/24/2020   Esophageal stricture    Benign neoplasm of transverse colon    Benign neoplasm of colon    Dysphagia    Renal cell cancer ([place])    Essential hypertension    Centrilobular emphysema ([place])    Cholelithiasis 04/26/2011   Paroxysmal atrial fibrillation ([place]) - not on systemic anticoagulation due to fall risk 04/26/2011   Lung nodule 03/02/2011   Liver  lesion 03/02/2011   Other hyperlipidemia 03/02/2011   Abnormal CT of the abdomen 02/23/2011   Non Q wave myocardial infarction ([place]) 02/22/2011   History of CVA (cerebrovascular accident) 02/22/2011   CAD (coronary artery disease) 02/22/2011   COPD exacerbation ([place]) 02/22/2011   Tobacco abuse, 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/22/2011    Orientation RESPIRATION BLADDER Height & Weight     Self, Time, Situation, Place  Normal Incontinent, External catheter Weight: 149 lb 7.6 oz (67.8 kg) Height:  6' (182.9 cm)  BEHAVIORAL SYMPTOMS/MOOD NEUROLOGICAL BOWEL NUTRITION STATUS      Continent Diet (See dc summary)  AMBULATORY STATUS COMMUNICATION OF NEEDS Skin   Extensive Assist Verbally Normal                       Personal Care Assistance Level of Assistance  Bathing, Feeding, Dressing Bathing Assistance: Maximum assistance Feeding assistance: Limited assistance Dressing Assistance: Maximum assistance     Functional Limitations Info  Sight, Hearing, Speech Sight Info: Impaired Hearing Info: Impaired Speech Info: Adequate    SPECIAL CARE FACTORS FREQUENCY  PT (By licensed PT), OT (By licensed OT)     PT Frequency: 5xweek OT Frequency: 5xweek            Contractures Contractures Info: Not present    Additional Factors Info  Code Status, Allergies Code Status Info: Full Allergies Info: Bee Venom and  Influenza Vaccines           Current Medications (01/26/2022):  This is the current hospital active medication list Current Facility-Administered Medications  Medication Dose Route Frequency Provider Last Rate Last Admin   acetaminophen (TYLENOL) tablet 650 mg  650 mg Oral Q6H PRN Andres Labrum D, PA-C   650 mg at 01/26/22 0736   albuterol (PROVENTIL) (2.5 MG/3ML) 0.083% nebulizer solution 2.5 mg  2.5 mg Nebulization Q2H PRN Jonetta Osgood, MD       alteplase (CATHFLO ACTIVASE) injection 2 mg  2 mg Intracatheter Once PRN Adelfa Koh, NP       budesonide (PULMICORT)  nebulizer solution 0.25 mg  0.25 mg Nebulization BID Andres Labrum D, PA-C   0.25 mg at 01/26/22 3825   busPIRone (BUSPAR) tablet 5 mg  5 mg Oral TID Mick Sell, PA-C   5 mg at 01/26/22 0919   ceFEPIme (MAXIPIME) 1 g in sodium chloride 0.9 % 100 mL IVPB  1 g Intravenous Q24H Lala Lund K, MD 200 mL/hr at 01/25/22 1221 1 g at 01/25/22 1221   Chlorhexidine Gluconate Cloth 2 % PADS 6 each  6 each Topical Q0600 Roney Jaffe, MD   6 each at 01/26/22 0919   dextromethorphan-guaiFENesin ([place] DM) 30-600 MG per 12 hr tablet 1 tablet  1 tablet Oral BID Mick Sell, PA-C   1 tablet at 01/26/22 0539   diltiazem (CARDIZEM) tablet 60 mg  60 mg Oral Q6H Jonetta Osgood, MD   60 mg at 01/26/22 0006   heparin injection 1,000 Units  1,000 Units Dialysis PRN Adelfa Koh, NP       heparin injection 3,000 Units  3,000 Units Dialysis Once in dialysis Roney Jaffe, MD       heparin injection 5,000 Units  5,000 Units Subcutaneous Q8H Mick Sell, PA-C   5,000 Units at 01/26/22 1319   hydrALAZINE (APRESOLINE) injection 10-40 mg  10-40 mg Intravenous Q4H PRN Andres Labrum D, PA-C   20 mg at 01/21/22 1927   hydrALAZINE (APRESOLINE) tablet 50 mg  50 mg Oral Q8H Thurnell Lose, MD   50 mg at 01/25/22 2210   HYDROcodone-acetaminophen (NORCO/VICODIN) 5-325 MG per tablet 1 tablet  1 tablet Oral Q6H PRN Howerter, Justin B, DO   1 tablet at 01/25/22 1931   hydrOXYzine (ATARAX) tablet 10 mg  10 mg Oral Q6H PRN Andres Labrum D, PA-C   10 mg at 01/23/22 2149   insulin aspart (novoLOG) injection 0-9 Units  0-9 Units Subcutaneous TID WC Thurnell Lose, MD   1 Units at 01/26/22 1211   levalbuterol (XOPENEX) nebulizer solution 0.63 mg  0.63 mg Nebulization BID Thurnell Lose, MD       And   ipratropium (ATROVENT) nebulizer solution 0.5 mg  0.5 mg Nebulization BID Thurnell Lose, MD       levothyroxine (SYNTHROID) tablet 75 mcg  75 mcg Oral q AM Andres Labrum D, PA-C   75 mcg at 01/26/22 0641    lidocaine (PF) (XYLOCAINE) 1 % injection 5 mL  5 mL Intradermal PRN Adelfa Koh, NP       lidocaine-prilocaine (EMLA) cream 1 Application  1 Application Topical PRN Adelfa Koh, NP       LORazepam (ATIVAN) injection 0.5 mg  0.5 mg Intravenous Q4H PRN Collier Bullock, MD   0.5 mg at 01/25/22 2201   melatonin tablet 3 mg  3 mg Oral QHS PRN Howerter, Justin B, DO   3 mg at 01/26/22 0025   methylPREDNISolone sodium succinate (SOLU-MEDROL) 40  mg/mL injection 40 mg  40 mg Intravenous Daily Thurnell Lose, MD       metoprolol tartrate (LOPRESSOR) injection 5 mg  5 mg Intravenous Q2H PRN Jonetta Osgood, MD   5 mg at 01/23/22 1003   metoprolol tartrate (LOPRESSOR) tablet 25 mg  25 mg Oral Q8H Ghimire, Shanker M, MD   25 mg at 01/25/22 1209   naloxone (NARCAN) injection 0.4 mg  0.4 mg Intravenous PRN Howerter, Justin B, DO       ondansetron (ZOFRAN) injection 4 mg  4 mg Intravenous Q6H PRN Thurnell Lose, MD   4 mg at 01/26/22 1211   Oral care mouth rinse  15 mL Mouth Rinse 4 times per day Laqueta Jean, MD   15 mL at 01/26/22 1214   Oral care mouth rinse  15 mL Mouth Rinse PRN Thurnell Lose, MD       pantoprazole (PROTONIX) EC tablet 40 mg  40 mg Oral BID Thurnell Lose, MD       pentafluoroprop-tetrafluoroeth (GEBAUERS) aerosol 1 Application  1 Application Topical PRN Adelfa Koh, NP       polyethylene glycol (MIRALAX / GLYCOLAX) packet 17 g  17 g Oral Daily Mick Sell, PA-C   17 g at 01/25/22 0851   pravastatin (PRAVACHOL) tablet 40 mg  40 mg Oral QHS Andres Labrum D, PA-C   40 mg at 01/25/22 2209   senna (SENOKOT) tablet 8.6 mg  8.6 mg Oral BID Mick Sell, PA-C   8.6 mg at 01/25/22 2210   sevelamer carbonate (RENVELA) tablet 1,600 mg  1,600 mg Oral TID WC Loren Racer, PA-C   1,600 mg at 01/26/22 1318   vancomycin (VANCOREADY) IVPB 750 mg/150 mL  750 mg Intravenous Once Thurnell Lose, MD         Discharge Medications: Please see  discharge summary for a list of discharge medications.  Relevant Imaging Results:  Relevant Lab Results:   Additional Information SS Number [id number] Requires outpatient dialysis.   Beckey Rutter, MSW, LCSWA, LCASA Transitions of Care  Clinical Social Worker I

## 2022-01-27 ENCOUNTER — Encounter (HOSPITAL_COMMUNITY): Payer: Self-pay | Admitting: Emergency Medicine

## 2022-01-27 ENCOUNTER — Other Ambulatory Visit: Payer: Self-pay

## 2022-01-27 DIAGNOSIS — J9601 Acute respiratory failure with hypoxia: Secondary | ICD-10-CM | POA: Diagnosis not present

## 2022-01-27 LAB — CBC WITH DIFFERENTIAL/PLATELET
Abs Immature Granulocytes: 0.1 10*3/uL — ABNORMAL HIGH (ref 0.00–0.07)
Basophils Absolute: 0 10*3/uL (ref 0.0–0.1)
Basophils Relative: 0 %
Eosinophils Absolute: 0 10*3/uL (ref 0.0–0.5)
Eosinophils Relative: 0 %
HCT: 35.3 % — ABNORMAL LOW (ref 39.0–52.0)
Hemoglobin: 11.4 g/dL — ABNORMAL LOW (ref 13.0–17.0)
Immature Granulocytes: 1 %
Lymphocytes Relative: 8 %
Lymphs Abs: 0.8 10*3/uL (ref 0.7–4.0)
MCH: 30.2 pg (ref 26.0–34.0)
MCHC: 32.3 g/dL (ref 30.0–36.0)
MCV: 93.6 fL (ref 80.0–100.0)
Monocytes Absolute: 0.9 10*3/uL (ref 0.1–1.0)
Monocytes Relative: 8 %
Neutro Abs: 8.8 10*3/uL — ABNORMAL HIGH (ref 1.7–7.7)
Neutrophils Relative %: 83 %
Platelets: 258 10*3/uL (ref 150–400)
RBC: 3.77 MIL/uL — ABNORMAL LOW (ref 4.22–5.81)
RDW: 14.8 % (ref 11.5–15.5)
WBC: 10.6 10*3/uL — ABNORMAL HIGH (ref 4.0–10.5)
nRBC: 0 % (ref 0.0–0.2)

## 2022-01-27 LAB — C-REACTIVE PROTEIN: CRP: 1.1 mg/dL — ABNORMAL HIGH (ref <1.0)

## 2022-01-27 LAB — BASIC METABOLIC PANEL
Anion gap: 12 (ref 5–15)
BUN: 36 mg/dL — ABNORMAL HIGH (ref 8–23)
CO2: 27 mmol/L (ref 22–32)
Calcium: 8.4 mg/dL — ABNORMAL LOW (ref 8.9–10.3)
Chloride: 95 mmol/L — ABNORMAL LOW (ref 98–111)
Creatinine, Ser: 3.87 mg/dL — ABNORMAL HIGH (ref 0.61–1.24)
GFR, Estimated: 15 mL/min — ABNORMAL LOW (ref >60)
Glucose, Bld: 137 mg/dL — ABNORMAL HIGH (ref 70–99)
Potassium: 3.8 mmol/L (ref 3.5–5.1)
Sodium: 134 mmol/L — ABNORMAL LOW (ref 135–145)

## 2022-01-27 LAB — MAGNESIUM: Magnesium: 1.5 mg/dL — ABNORMAL LOW (ref 1.7–2.4)

## 2022-01-27 LAB — GLUCOSE, CAPILLARY
Glucose-Capillary: 117 mg/dL — ABNORMAL HIGH (ref 70–99)
Glucose-Capillary: 170 mg/dL — ABNORMAL HIGH (ref 70–99)
Glucose-Capillary: 126 mg/dL — ABNORMAL HIGH (ref 70–99)
Glucose-Capillary: 233 mg/dL — ABNORMAL HIGH (ref 70–99)

## 2022-01-27 LAB — BRAIN NATRIURETIC PEPTIDE: B Natriuretic Peptide: 1866.5 pg/mL — ABNORMAL HIGH (ref 0.0–100.0)

## 2022-01-27 MED ORDER — DILTIAZEM HCL 60 MG PO TABS: 60.0000 mg | ORAL_TABLET | Freq: Three times a day (TID) | ORAL | Status: AC

## 2022-01-27 MED ORDER — MAGNESIUM SULFATE 2 GM/50ML IV SOLN
2.0000 g | Freq: Once | INTRAVENOUS | Status: AC
  Administered 2022-01-27: 2 g via INTRAVENOUS
  Filled 2022-01-27: qty 50

## 2022-01-27 MED ORDER — PROSOURCE PLUS PO LIQD
30.0000 mL | Freq: Two times a day (BID) | ORAL | Status: DC
  Administered 2022-01-27 (×2): 30 mL via ORAL
  Filled 2022-01-27 (×2): qty 30

## 2022-01-27 MED ORDER — HYDROCODONE-ACETAMINOPHEN 10-325 MG PO TABS: 1.0000 | ORAL_TABLET | Freq: Four times a day (QID) | ORAL | 0 refills | Status: AC | PRN

## 2022-01-27 NOTE — Progress Notes (Signed)
D/C order noted. Contacted [facility] to advise staff that pt will d/c today and resume care tomorrow.   Melven Sartorius Renal Navigator [phone]

## 2022-01-27 NOTE — Discharge Summary (Addendum)
Wayne Meadows SKA:[id number] DOB: 05-Jan-1935 DOA: 01/20/2022  PCP: [name]  Admit date: 01/20/2022  Discharge date: 01/27/2022  Admitted From: SNF   Disposition:  SNF   Recommendations for Outpatient Follow-up:   Follow up with PCP in 1-2 weeks  PCP Please obtain BMP/CBC, 2 view CXR in 1week,  (see Discharge instructions)   PCP Please follow up on the following pending results:    Home Health: None   Equipment/Devices: None  Consultations: Renal, PCCM Discharge Condition: Stable    CODE STATUS: Full    Diet Recommendation: Renal diet with 1.5 L fluid restriction per day    Chief Complaint  Patient presents with   Shortness of Breath     Brief history of present illness from the day of admission and additional interim summary    87 y.o.  male with history of COPD on home O2, ESRD on HD MWF, PAF-not on anticoagulation-presented to hospital with shortness of breath-he was found to have acute on chronic hypoxic respiratory failure requiring BiPAP-due to a combination of pulm edema/from missed hemodialysis with some contribution from his underlying COPD.  He was briefly monitored in the [place] transferred to [facility] on 12/1.   Significant events: 11/28>> presented with SOB-on BiPAP-felt to have pulm edema due to missed HD/COPD exacerbation..  Unable to be liberated off BiPAP even after HD x 1. 12/1>> transfer to [facility]-transient episode of A-fib RVR-spontaneously converted to sinus rhythm.   Significant studies: 11/28>> CXR: Increased pulm edema, unchanged moderate left-sided pleural effusion 11/29>> CXR: Stable large left pleural effusion. 11/29>> echo: EF 20-35%, RV systolic function normal.   Significant microbiology data: 11/28>> COVID/influenza PCR: Negative   Procedures: None    Consults: PCCM, Renal                                                                 Hospital Course   Acute on chronic hypoxic respiratory failure (on 2-3 L of oxygen at home) with COPD exacerbation and acute on diastolic chronic CHF EF 59% -   Multifactorial etiology-pulm edema from missed HD x 1, COPD exacerbation, possible PNA, was on BiPAP for multiple days now on a as needed basis since 01/24/2022, fluid bleeding removed via dialysis.  Showed significant improvement after fluid was removed via HD, significant role was being plagued by fluid overload in his respiratory failure, now on 1 L nasal cannula oxygen and symptom-free.  Continue HD Monday Wednesday Friday, finishing his antibiotic course for pneumonia today.  Stop steroids.  Monitor closely at SNF.   PAF RVR - Brief episode of RVR this morning-spontaneously converted to sinus rhythm, Reviewed prior cardiology note-not on anticoagulation due to history of GI bleeding/fall risk (CHA2DS2-VASc 5),  continue combination of Cardizem and beta-blocker.     Chronic left-sided pleural effusion -  Suspect transudative-likely related to HD-no shortness of breath now, monitor intermittently with two-view chest x-ray in 7 to 10 days.   Anxiety - Stable now.   ESRD on HD MWF - See above regarding volume overload, Nephrology following and directing HD care   Hx of CAD-s/p PCI to RCA 2011, Hx of CVA, Hx of PAD - Stable-continue statin, suspect not on an platelets due to history of recurrent GI bleeding   HTN - on Cardizem and beta-blocker,stable   HLD - Continue statin   Hypothyroidism - Synthroid   History of gastric/duodenal ulcers, Barrett's esophagus - No active GI bleeding noted, On PPI  Mid Back chronic cyst - per patient for 6 to 8 mths, outpt Ortho follow up.   Discharge diagnosis     Principal Problem:   Acute respiratory failure with hypoxia (HCC) Active Problems:   COPD exacerbation [facility])    Discharge instructions     Discharge Instructions     Discharge instructions   Complete by: As directed    Follow with Primary MD [name] in 7 days   Get CBC, BMP, Magnesium  -  checked next visit with your primary MD or SNF MD    Activity: As tolerated with Full fall precautions use walker/cane & assistance as needed  Disposition SNF  Diet: Renal diet with 1.5 L fluid restriction per day  Special Instructions: If you have smoked or chewed Tobacco  in the last 2 yrs please stop smoking, stop any regular Alcohol  and or any Recreational drug use.  On your next visit with your primary care physician please Get Medicines reviewed and adjusted.  Please request your Prim.MD to go over all Hospital Tests and Procedure/Radiological results at the follow up, please get all Hospital records sent to your Prim MD by signing hospital release before you go home.  If you experience worsening of your admission symptoms, develop shortness of breath, life threatening emergency, suicidal or homicidal thoughts you must seek medical attention immediately by calling 911 or calling your MD immediately  if symptoms less severe.  You Must read complete instructions/literature along with all the possible adverse reactions/side effects for all the Medicines you take and that have been prescribed to you. Take any new Medicines after you have completely understood and accpet all the possible adverse reactions/side effects.   Increase activity slowly   Complete by: As directed        Discharge Medications   Allergies as of 01/27/2022       Reactions   Bee Venom Anaphylaxis   Influenza Vaccines Other (See Comments)   "Mortally sick for 2 weeks"        Medication List     STOP taking these medications    diltiazem 240 MG 24 hr capsule Commonly known as: CARDIZEM CD   neomycin-bacitracin-polymyxin Oint Commonly known as: NEOSPORIN       TAKE these medications    acetaminophen 325 MG tablet Commonly  known as: TYLENOL Take 2 tablets (650 mg total) by mouth every 6 (six) hours as needed for mild pain (or Fever >/= 101). What changed: reasons to take this   antiseptic oral rinse Liqd 10 mLs by Mouth Rinse route 2 (two) times daily.   benzonatate 100 MG capsule Commonly known as: TESSALON Take 1 capsule (100 mg total) by mouth 3 (three) times daily.   Biofreeze 4 % Gel Generic drug: Menthol (Topical Analgesic) Apply 1 application  topically 3 (three) times daily as needed (  topical analgesic).   busPIRone 5 MG tablet Commonly known as: BUSPAR Take 1 tablet (5 mg total) by mouth 3 (three) times daily. (0800 & 2000) What changed: additional instructions   CLEAR EYES REDNESS RELIEF OP Place 1-2 drops into the right eye 3 (three) times daily.   Combivent Respimat 20-100 MCG/ACT Aers respimat Generic drug: Ipratropium-Albuterol Inhale 1 puff into the lungs 4 (four) times daily.   ipratropium-albuterol 0.5-2.5 (3) MG/3ML Soln Commonly known as: DUONEB Take 3 mLs by nebulization every 4 (four) hours as needed (shortness of breath, wheezing).   Darbepoetin Alfa 60 MCG/0.3ML Sosy injection Commonly known as: ARANESP Inject 0.3 mLs (60 mcg total) into the vein every Monday with hemodialysis.   dextromethorphan-guaiFENesin 30-600 MG 12hr tablet Commonly known as: MUCINEX DM Take 1 tablet by mouth 2 (two) times daily.   diltiazem 60 MG tablet Commonly known as: CARDIZEM Take 1 tablet (60 mg total) by mouth 3 (three) times daily.   feeding supplement (PRO-STAT SUGAR FREE 64) Liqd Take 30 mLs by mouth every evening.   HYDROcodone-acetaminophen 10-325 MG tablet Commonly known as: NORCO Take 1 tablet by mouth every 6 (six) hours as needed for severe pain or moderate pain. What changed: when to take this   hydrOXYzine 10 MG tablet Commonly known as: ATARAX Take 10 mg by mouth every 6 (six) hours as needed for itching.   levothyroxine 75 MCG tablet Commonly known as:  SYNTHROID Take 75 mcg by mouth in the morning. (0630)   melatonin 5 MG Tabs Take 5 mg by mouth at bedtime.   metoprolol tartrate 25 MG tablet Commonly known as: LOPRESSOR Take 0.5 tablets (12.5 mg total) by mouth 2 (two) times daily.   NovaSource Renal Liqd Take 237 mLs by mouth 2 (two) times daily.   ondansetron 4 MG tablet Commonly known as: ZOFRAN Take 4 mg by mouth every 6 (six) hours as needed for nausea or vomiting.   pantoprazole 40 MG tablet Commonly known as: PROTONIX Take 40 mg by mouth in the morning.   polyethylene glycol 17 g packet Commonly known as: MIRALAX / GLYCOLAX Take 17 g by mouth daily. What changed: when to take this   pravastatin 40 MG tablet Commonly known as: PRAVACHOL Take 40 mg by mouth at bedtime.   senna 8.6 MG Tabs tablet Commonly known as: SENOKOT Take 8.6 mg by mouth 2 (two) times daily.   sevelamer carbonate 800 MG tablet Commonly known as: RENVELA Take 1,600 mg by mouth with breakfast, with lunch, and with evening meal.   sodium chloride 0.65 % Soln nasal spray Commonly known as: OCEAN Place 2 sprays into both nostrils 2 (two) times daily.   tamsulosin 0.4 MG Caps capsule Commonly known as: FLOMAX Take 1 capsule (0.4 mg total) by mouth daily. What changed: when to take this          Major procedures and Radiology Reports - PLEASE review detailed and final reports thoroughly  -      DG Chest Port 1 View  Result Date: 01/24/2022 CLINICAL DATA:  87 year old male with history of shortness of breath. EXAM: PORTABLE CHEST 1 VIEW COMPARISON:  Chest x-ray 01/23/2022. FINDINGS: Small right and moderate left pleural effusions. Bibasilar opacities (left-greater-than-right), compatible with areas of atelectasis and/or consolidation. There is cephalization of the pulmonary vasculature and slight indistinctness of the interstitial markings suggestive of mild pulmonary edema. Mild cardiomegaly. Upper mediastinal contours are within normal  limits. Atherosclerotic calcifications are noted in the thoracic aorta. IMPRESSION: 1.  The appearance the chest is most suggestive of congestive heart failure, as above. 2. Bibasilar opacities are favored to predominantly reflect areas of atelectasis, however, underlying airspace consolidation (particularly in the left lower lobe) is not excluded. 3. Aortic atherosclerosis. Electronically Signed   By: Vinnie Langton M.D.   On: 01/24/2022 09:00   DG Chest Port 1 View  Result Date: 01/23/2022 CLINICAL DATA:  Shortness of breath EXAM: PORTABLE CHEST 1 VIEW COMPARISON:  Two days ago FINDINGS: Allowing for redistribution similar appearance of moderate to large left pleural effusion. Hazy pulmonary density of both lung bases. No pneumothorax. Normal heart size and stable mediastinal contours. IMPRESSION: At least moderate left pleural effusion that is similar to prior when allowing for redistribution. Stable pulmonary opacity usually from atelectasis. Electronically Signed   By: Jorje Guild M.D.   On: 01/23/2022 11:39   DG CHEST PORT 1 VIEW  Result Date: 01/21/2022 CLINICAL DATA:  None shortness of breath EXAM: PORTABLE CHEST 1 VIEW COMPARISON:  Film from earlier in the same day. FINDINGS: Cardiac shadow is stable. The lungs are well aerated bilaterally. Large left-sided pleural effusion is again noted and stable. No bony abnormality is noted. IMPRESSION: Stable large left pleural effusion. Electronically Signed   By: Inez Catalina M.D.   On: 01/21/2022 21:27   ECHOCARDIOGRAM COMPLETE  Result Date: 01/21/2022    ECHOCARDIOGRAM REPORT   Patient Name:   Wayne Meadows Date of Exam: 01/21/2022 Medical Rec #:  [id number]        Height:       72.0 in Accession #:    [id number]       Weight:       166.9 lb Date of Birth:  11-01-1934       BSA:          1.973 m Patient Age:    87 years         BP:           161/81 mmHg Patient Gender: M                HR:           98 bpm. Exam Location:  Inpatient  Procedure: 2D Echo, Color Doppler and Cardiac Doppler Indications:    Elevated BNP  History:        Patient has prior history of Echocardiogram examinations, most                 recent 09/30/2021. CAD, COPD and Stroke, Arrythmias:Atrial                 Fibrillation, Signs/Symptoms:Dyspnea; Risk Factors:Hypertension                 and Former Smoker. ESRD, chronic pleural effusion.  Sonographer:    Eartha Inch Referring Phys: Andres Labrum, D  Sonographer Comments: Technically difficult study due to poor echo windows. Image acquisition challenging due to patient body habitus and Image acquisition challenging due to respiratory motion. Pt on BiPAP at time of study. IMPRESSIONS  1. Left ventricular ejection fraction, by estimation, is 50 to 55%. The left ventricle has low normal function. The left ventricle has no regional wall motion abnormalities. The left ventricular internal cavity size was mildly dilated. Indeterminate diastolic filling due to E-A fusion.  2. Right ventricular systolic function is normal. The right ventricular size is not well visualized. There is normal pulmonary artery systolic pressure.  3. Left atrial size was mild to moderately dilated.  4. Right atrial size was moderately dilated.  5. Moderate pleural effusion.  6. Trivial mitral valve regurgitation.  7. Aortic valve regurgitation is not visualized. No aortic stenosis is present.  8. The inferior vena cava is normal in size with greater than 50% respiratory variability, suggesting right atrial pressure of 3 mmHg. Conclusion(s)/Recommendation(s): Compared to prior echo, EF mildly reduced. Moderate pleural effusion. FINDINGS  Left Ventricle: Left ventricular ejection fraction, by estimation, is 50 to 55%. The left ventricle has low normal function. The left ventricle has no regional wall motion abnormalities. The left ventricular internal cavity size was mildly dilated. There is borderline left ventricular hypertrophy. Abnormal (paradoxical)  septal motion, consistent with left bundle branch block. Indeterminate diastolic filling due to E-A fusion. Right Ventricle: The right ventricular size is not well visualized. Right ventricular systolic function is normal. There is normal pulmonary artery systolic pressure. The tricuspid regurgitant velocity is 2.45 m/s, and with an assumed right atrial pressure of 3 mmHg, the estimated right ventricular systolic pressure is 62.9 mmHg. Left Atrium: Left atrial size was mild to moderately dilated. Right Atrium: Right atrial size was moderately dilated. Pericardium: Trivial pericardial effusion is present. Mitral Valve: Trivial mitral valve regurgitation. MV peak gradient, 7.4 mmHg. The mean mitral valve gradient is 3.0 mmHg. Tricuspid Valve: Tricuspid valve regurgitation is mild. Aortic Valve: Aortic valve regurgitation is not visualized. No aortic stenosis is present. Pulmonic Valve: Pulmonic valve regurgitation is not visualized. Aorta: The aortic root and ascending aorta are structurally normal, with no evidence of dilitation. Venous: The inferior vena cava is normal in size with greater than 50% respiratory variability, suggesting right atrial pressure of 3 mmHg. IAS/Shunts: The interatrial septum was not well visualized. Additional Comments: There is a moderate pleural effusion.  LEFT VENTRICLE PLAX 2D LVIDd:         5.90 cm      Diastology LVIDs:         4.30 cm      LV e' medial:    2.07 cm/s LV PW:         1.10 cm      LV E/e' medial:  58.9 LV IVS:        0.90 cm      LV e' lateral:   4.35 cm/s LVOT diam:     2.10 cm      LV E/e' lateral: 28.0 LV SV:         96 LV SV Index:   49 LVOT Area:     3.46 cm  LV Volumes (MOD) LV vol d, MOD A2C: 157.0 ml LV vol d, MOD A4C: 171.0 ml LV vol s, MOD A2C: 87.3 ml LV vol s, MOD A4C: 98.4 ml LV SV MOD A2C:     69.7 ml LV SV MOD A4C:     171.0 ml LV SV MOD BP:      72.2 ml RIGHT VENTRICLE RV S prime:     16.60 cm/s TAPSE (M-mode): 2.3 cm LEFT ATRIUM             Index         RIGHT ATRIUM           Index LA diam:        4.50 cm 2.28 cm/m   RA Area:     24.50 cm LA Vol (A2C):   82.3 ml 41.72 ml/m  RA Volume:   94.20 ml  47.75 ml/m LA Vol (A4C):   38.8 ml 19.67 ml/m LA  Biplane Vol: 59.2 ml 30.01 ml/m  AORTIC VALVE LVOT Vmax:   129.00 cm/s LVOT Vmean:  103.000 cm/s LVOT VTI:    0.277 m  AORTA Ao Root diam: 2.80 cm Ao Asc diam:  3.00 cm MITRAL VALVE                TRICUSPID VALVE MV Area (PHT): 6.60 cm     TR Peak grad:   24.0 mmHg MV Area VTI:   2.85 cm     TR Vmax:        245.00 cm/s MV Peak grad:  7.4 mmHg MV Mean grad:  3.0 mmHg     SHUNTS MV Vmax:       1.36 m/s     Systemic VTI:  0.28 m MV Vmean:      84.8 cm/s    Systemic Diam: 2.10 cm MV Decel Time: 115 msec MV E velocity: 122.00 cm/s MV A velocity: 49.30 cm/s MV E/A ratio:  2.47 Placido Sou signed by Phineas Inches Signature Date/Time: 01/21/2022/10:06:57 AM    Final    DG Chest Port 1 View  Result Date: 01/21/2022 CLINICAL DATA:  Hypoxic respiratory failure. EXAM: PORTABLE CHEST 1 VIEW COMPARISON:  Portable chest 01/20/2022, CTA chest 10/21/2021 FINDINGS: 5:33 a.m. A moderate-sized left pleural effusion is again noted with overlying atelectasis or consolidation. There is band atelectasis overlying the crest of the right hemidiaphragm with remaining lungs clear with COPD change. Right sulci are sharp. Cardiomegaly is again noted with mild central vascular prominence. There is no overt edema. The mediastinal configuration is stable with mild aortic calcification and tortuosity. Osteopenia, mild levoscoliosis and multilevel thoracic spine compression fractures are again shown. Overall aeration seems unchanged.  No new abnormality. IMPRESSION: 1. No significant change since yesterday's study. 2. Moderate-sized left pleural effusion with overlying atelectasis or consolidation. 3. COPD. 4. Cardiomegaly with mild central vascular prominence. 5. Aortic atherosclerosis. Electronically Signed   By: Telford Nab  M.D.   On: 01/21/2022 05:54   DG Chest Port 1 View  Result Date: 01/20/2022 CLINICAL DATA:  Shortness of breath EXAM: PORTABLE CHEST 1 VIEW COMPARISON:  Radiograph 01/01/2019 FINDINGS: Unchanged cardiomediastinal silhouette. There is a moderate size left pleural effusion with distal basilar consolidation, similar prior. There are increased diffuse interstitial opacities and left upper lung alveolar opacities. Skin fold overlies the right upper chest. No evidence of pneumothorax. Bones are unchanged including multiple thoracic compression deformity. IMPRESSION: Increased pulmonary edema. Unchanged moderate size left pleural effusion with adjacent dense consolidation or atelectasis. Electronically Signed   By: Maurine Simmering M.D.   On: 01/20/2022 14:51   DG Chest 1 View  Result Date: 12/31/2021 CLINICAL DATA:  Chest pain EXAM: CHEST  1 VIEW COMPARISON:  12/04/2021 FINDINGS: Transverse diameter of heart is increased. Small to moderate bilateral pleural effusions are seen, more so on the left side. Low position of diaphragms suggests COPD. Pulmonary vascularity is unremarkable. There are no signs of alveolar pulmonary edema in the parahilar regions. There is no pneumothorax. There is a linear metallic density overlying the right lower lung field. This may be an artifact outside the patient's body. There is evidence of surgical fusion in lumbar spine. IMPRESSION: Bilateral pleural effusions with interval increase. COPD. There are no signs of alveolar pulmonary edema or focal consolidation in the visualized lung fields. Evaluation of left lower lung fields for infiltrates is limited by superimposed effusion. Electronically Signed   By: Elmer Picker M.D.   On: 12/31/2021 18:39  Micro Results    Recent Results (from the past 240 hour(s))  Resp Panel by RT-PCR (Flu A&B, Covid) Anterior Nasal Swab     Status: None   Collection Time: 01/20/22  2:03 PM   Specimen: Anterior Nasal Swab  Result Value Ref  Range Status   SARS Coronavirus 2 by RT PCR NEGATIVE NEGATIVE Final    Comment: (NOTE) SARS-CoV-2 target nucleic acids are NOT DETECTED.  The SARS-CoV-2 RNA is generally detectable in upper respiratory specimens during the acute phase of infection. The lowest concentration of SARS-CoV-2 viral copies this assay can detect is 138 copies/mL. A negative result does not preclude SARS-Cov-2 infection and should not be used as the sole basis for treatment or other patient management decisions. A negative result may occur with  improper specimen collection/handling, submission of specimen other than nasopharyngeal swab, presence of viral mutation(s) within the areas targeted by this assay, and inadequate number of viral copies(<138 copies/mL). A negative result must be combined with clinical observations, patient history, and epidemiological information. The expected result is Negative.  Fact Sheet for Patients:  EntrepreneurPulse.com.au  Fact Sheet for Healthcare Providers:  IncredibleEmployment.be  This test is no t yet approved or cleared by the [place] FDA and  has been authorized for detection and/or diagnosis of SARS-CoV-2 by FDA under an Emergency Use Authorization (EUA). This EUA will remain  in effect (meaning this test can be used) for the duration of the COVID-19 declaration under Section 564(b)(1) of the Act, 21 U.S.C.section 360bbb-3(b)(1), unless the authorization is terminated  or revoked sooner.       Influenza A by PCR NEGATIVE NEGATIVE Final   Influenza B by PCR NEGATIVE NEGATIVE Final    Comment: (NOTE) The Xpert Xpress SARS-CoV-2/FLU/RSV plus assay is intended as an aid in the diagnosis of influenza from Nasopharyngeal swab specimens and should not be used as a sole basis for treatment. Nasal washings and aspirates are unacceptable for Xpert Xpress SARS-CoV-2/FLU/RSV testing.  Fact Sheet for  Patients: EntrepreneurPulse.com.au  Fact Sheet for Healthcare Providers: IncredibleEmployment.be  This test is not yet approved or cleared by the [place] FDA and has been authorized for detection and/or diagnosis of SARS-CoV-2 by FDA under an Emergency Use Authorization (EUA). This EUA will remain in effect (meaning this test can be used) for the duration of the COVID-19 declaration under Section 564(b)(1) of the Act, 21 U.S.C. section 360bbb-3(b)(1), unless the authorization is terminated or revoked.  Performed at [facility], [place] [address], [place], [place] [address]     Today   Subjective    Wayne Meadows today has no headache,no chest abdominal pain,no new weakness tingling or numbness, feels much better   Objective   Blood pressure 136/61, pulse 79, temperature 98.4 F (36.9 C), temperature source Oral, resp. rate 18, height 6' (1.829 m), weight 67.5 kg, SpO2 91 %.   Intake/Output Summary (Last 24 hours) at 01/27/2022 1047 Last data filed at 01/27/2022 0400 Gross per 24 hour  Intake 480 ml  Output 1.7 ml  Net 478.3 ml    Exam  Awake Alert, No new F.N deficits,    [place].AT,PERRAL Supple Neck,   Symmetrical Chest wall movement, Good air movement bilaterally, CTAB RRR,No Gallops,   +ve B.Sounds, Abd Soft, Non tender,  No Cyanosis, Clubbing or edema    Data Review   Recent Labs  Lab 01/20/22 1550 01/20/22 1617 01/23/22 1134 01/24/22 0149 01/25/22 0222 01/26/22 0836 01/27/22 0548  WBC 13.4*   < >  9.9 8.1 5.9 7.5 10.6*  HGB 10.7*   < > 10.4* 10.4* 11.3* 12.5* 11.4*  HCT 34.4*   < > 33.7* 32.8* 35.6* 38.4* 35.3*  PLT 463*   < > 301 316 317 329 258  MCV 98.3   < > 96.8 94.5 94.7 92.1 93.6  MCH 30.6   < > 29.9 30.0 30.1 30.0 30.2  MCHC 31.1   < > 30.9 31.7 31.7 32.6 32.3  RDW 15.1   < > 15.6* 15.3 15.0 14.8 14.8  LYMPHSABS 0.5*  --   --   --  0.4* 0.4* 0.8  MONOABS 0.1  --   --   --  0.2 0.4 0.9  EOSABS  0.1  --   --   --  0.0 0.0 0.0  BASOSABS 0.1  --   --   --  0.0 0.0 0.0   < > = values in this interval not displayed.    Recent Labs  Lab 01/20/22 1550 01/20/22 1617 01/21/22 0610 01/22/22 0550 01/23/22 1134 01/23/22 1425 01/24/22 0710 01/25/22 0147 01/25/22 0222 01/26/22 0836 01/27/22 0548  NA 132*   < > 131* 137  --  137 136  --  137 133* 134*  K 6.3*   < > 4.5 4.1  --  4.4 4.3  --  3.7 4.5 3.8  CL 91*   < > 91* 93*  --  98 96*  --  94* 91* 95*  CO2 23  --  22 26  --  23 20*  --  '24 25 27  '$ GLUCOSE 112*   < > 104* 115*  --  86 84  --  136* 128* 137*  BUN 53*   < > 29* 29*  --  59* 42*  --  29* 58* 36*  CREATININE 7.91*   < > 4.97* 4.09*   < > 6.22* 4.47*  --  3.47* 5.47* 3.87*  AST 15  --  17  --   --   --   --   --   --   --   --   ALT 11  --  12  --   --   --   --   --   --   --   --   ALKPHOS 122  --  113  --   --   --   --   --   --   --   --   BILITOT 2.2*  --  1.8*  --   --   --   --   --   --   --   --   ALBUMIN 3.1*  --  2.8*  --   --  2.6*  --   --   --   --   --   CRP  --   --   --   --   --   --  6.4*  --  3.9* 1.7* 1.1*  PROCALCITON  --   --   --   --   --   --  1.32  --  1.11 0.78  --   TSH  --   --  4.573*  --   --   --   --   --   --   --   --   HGBA1C  --   --   --   --   --   --   --  5.3  --   --   --  BNP 2,754.5*  --   --   --   --   --  3,340.8*  --  3,036.8* 3,215.7* 1,866.5*  MG  --    < > 1.9 1.9  --   --  1.8  --  1.8 1.9 1.5*  CALCIUM 8.8*  --  8.3* 8.8*  --  8.4* 8.4*  --  8.7* 8.6* 8.4*   < > = values in this interval not displayed.     Total Time in preparing paper work, data evaluation and todays exam - 35 minutes  Signature  -    Lala Lund M.D on 01/27/2022 at 10:47 AM   -  To page go to [email or web address]

## 2022-01-27 NOTE — Progress Notes (Signed)
[facility] Progress Note   Subjective:  Seen in room - looks comfortable. S/p HD last night - 1.7L net UF. Reports afterwards, he felt very "congested" - but he reports breathing better this AM despite higher O2 requirement.  Objective Vitals:   01/27/22 0558 01/27/22 0600 01/27/22 0800 01/27/22 0816  BP: (!) 144/63 (!) 144/63 136/61   Pulse: 85 84 79   Resp:  20 18   Temp:   98.4 F (36.9 C)   TempSrc:   Oral   SpO2:  94% 93% 91%  Weight:      Height:       Physical Exam General: Frail man, NAD. Nasal O2 in place Heart: RRR; no murmur Lungs: CTA anteriorly, no wheezing or rales Abdomen: soft, non-tender Extremities: no LE edema Dialysis Access: R AVF + bruit  Additional Objective Labs: Basic Metabolic Panel: Recent Labs  Lab 01/23/22 1425 01/24/22 0710 01/25/22 0222 01/26/22 0836 01/27/22 0548  NA 137   < > 137 133* 134*  K 4.4   < > 3.7 4.5 3.8  CL 98   < > 94* 91* 95*  CO2 23   < > '24 25 27  '$ GLUCOSE 86   < > 136* 128* 137*  BUN 59*   < > 29* 58* 36*  CREATININE 6.22*   < > 3.47* 5.47* 3.87*  CALCIUM 8.4*   < > 8.7* 8.6* 8.4*  PHOS 6.8*  --   --  5.3*  --    < > = values in this interval not displayed.   Liver Function Tests: Recent Labs  Lab 01/20/22 1550 01/21/22 0610 01/23/22 1425  AST 15 17  --   ALT 11 12  --   ALKPHOS 122 113  --   BILITOT 2.2* 1.8*  --   PROT 6.9 6.6  --   ALBUMIN 3.1* 2.8* 2.6*   CBC: Recent Labs  Lab 01/23/22 1134 01/24/22 0149 01/25/22 0222 01/26/22 0836 01/27/22 0548  WBC 9.9 8.1 5.9 7.5 10.6*  NEUTROABS  --   --  5.3 6.6 8.8*  HGB 10.4* 10.4* 11.3* 12.5* 11.4*  HCT 33.7* 32.8* 35.6* 38.4* 35.3*  MCV 96.8 94.5 94.7 92.1 93.6  PLT 301 316 317 329 258   Medications:  ceFEPime (MAXIPIME) IV Stopped (01/26/22 2200)   magnesium sulfate bolus IVPB      budesonide (PULMICORT) nebulizer solution  0.25 mg Nebulization BID   busPIRone  5 mg Oral TID   Chlorhexidine Gluconate Cloth  6 each Topical  Q0600   dextromethorphan-guaiFENesin  1 tablet Oral BID   diltiazem  60 mg Oral Q6H   heparin  5,000 Units Subcutaneous Q8H   hydrALAZINE  50 mg Oral Q8H   insulin aspart  0-9 Units Subcutaneous TID WC   levalbuterol  0.63 mg Nebulization BID   And   ipratropium  0.5 mg Nebulization BID   levothyroxine  75 mcg Oral q AM   methylPREDNISolone (SOLU-MEDROL) injection  40 mg Intravenous Daily   metoprolol tartrate  25 mg Oral Q8H   mouth rinse  15 mL Mouth Rinse 4 times per day   pantoprazole  40 mg Oral BID   polyethylene glycol  17 g Oral Daily   pravastatin  40 mg Oral QHS   senna  8.6 mg Oral BID   sevelamer carbonate  1,600 mg Oral TID WC    Dialysis Orders: [facility]-MWF 4h  400/1.5   72.5kg  3K/2.5Ca bath   Hep 3000  RUA AVF - last HD 11/24, post 74kg - mircera 75 ug q4, last 11/21 - no vdra   Assessment/Plan: Resp distress: Multifactorial: COPD + HCAP + pulm edema. Now well below his prior dry weight, will lower on HD. Remains on Vanc/Cefepime.  ESRD: Continue HD on MWF schedule - next HD tomorrow (12/6). Paroxysmal A-Fib: On BB and cardizem, HR 80s- 90s. No AC d/t Hx GIB and fall risk. COPD: Per primary. HTN: BP stable, continue home meds. Secondary HPTH: Ca ok, Phos a little high -restarted home meds (Renvela 2/meals) Anemia of ESRD: Hgb 11.4, no ESA needs for now. Nutrition: Alb low, adding supplements. GOC: Frequent admits and falls, frail. Palliative care consulted. Family has elected to continue full scope/FULL CODE - but willing to follow-up with palliative care as outpatient.    Veneta Penton, PA-C 01/27/2022, 8:47 AM  [employer]

## 2022-01-27 NOTE — Plan of Care (Signed)

## 2022-01-27 NOTE — TOC Progression Note (Signed)
Transition of Care [facility]) - Progression Note    Patient Details  Name: TYMOTHY CASS MRN: [id number] Date of Birth: 03-Apr-1934  Transition of Care [facility]) CM/SW [place], LCSW Phone Number: 01/27/2022, 3:26 PM  Clinical Narrative:    [facility] aware patient likely ready for discharge tomorrow.    Expected Discharge Plan: [place] Barriers to Discharge: Barriers Resolved  Expected Discharge Plan and Services Expected Discharge Plan: [place] In-house Referral: Clinical Social Work   Post Acute Care Choice: [place] Living arrangements for the past 2 months: [place] Expected Discharge Date: 01/27/22                                     Social Determinants of Health (SDOH) Interventions    Readmission Risk Interventions    01/27/2022    3:25 PM 09/30/2021    2:03 PM 03/21/2021    1:21 PM  Readmission Risk Prevention Plan  Transportation Screening Complete Complete Complete  Medication Review Press photographer) Complete Complete   PCP or Specialist appointment within 3-5 days of discharge Complete Complete   HRI or Home Care Consult Complete Complete Complete  SW Recovery Care/Counseling Consult Complete Complete Complete  Palliative Care Screening Not Applicable Not Applicable   Skilled Nursing Facility Complete Complete Complete

## 2022-01-27 NOTE — TOC Transition Note (Addendum)
Transition of Care [facility]) - CM/SW Discharge Note   Patient Details  Name: Wayne Meadows MRN: [id number] Date of Birth: [birth year]  Transition of Care [facility]) CM/SW Contact:  Benard Halsted, LCSW Phone Number: 01/27/2022, 3:26 PM   Clinical Narrative:    Patient will DC to: [facility] Anticipated DC date: 01/27/22 Family notified: Updated patient's daughter, [name], as patient requested Transport by: Corey Harold   Per MD patient ready for DC to [facility]. RN to call report prior to discharge [phone]). RN, patient, patient's family, and facility notified of DC. Discharge Summary and FL2 sent to facility. DC packet on chart including signed script. Ambulance transport requested for patient.   CSW will sign off for now as social work intervention is no longer needed. Please consult [place] again if new needs arise.     Final next level of care: Skilled Nursing Facility Barriers to Discharge: Barriers Resolved   Patient Goals and CMS Choice Patient states their goals for this hospitalization and ongoing recovery are:: Return to snf CMS Medicare.gov Compare Post Acute Care list provided to:: Patient Choice offered to / list presented to : Patient  Discharge Placement   Existing PASRR number confirmed : 01/27/22          Patient [facility] bed at: [facility] Patient to be transferred to facility by: [facility]   Patient and family notified of of transfer: 01/27/22  Discharge Plan and Services In-house Referral: Clinical Social Work   Post Acute Care Choice: [place]                               Social Determinants of Health (SDOH) Interventions     Readmission Risk Interventions    01/27/2022    3:25 PM 09/30/2021    2:03 PM 03/21/2021    1:21 PM  Readmission Risk Prevention Plan  Transportation Screening Complete Complete Complete  Medication Review Press photographer) Complete Complete   PCP or Specialist appointment within 3-5 days of  discharge Complete Complete   HRI or Home Care Consult Complete Complete Complete  SW Recovery Care/Counseling Consult Complete Complete Complete  Palliative Care Screening Not Applicable Not Applicable   Skilled Nursing Facility Complete Complete Complete

## 2022-01-27 NOTE — Progress Notes (Signed)
Physical Therapy Treatment Patient Details Name: Wayne Meadows MRN: [id number] DOB: [birth year] Today's Date: 01/27/2022   History of Present Illness 87 y.o. male admitted  01/20/22 with Acute on chronic hypoxic respiratory failure, PAF RVR, and Left pleural effusion. history of COPD on home O2, ESRD on HD MWF, PAF.    PT Comments    Patient progressing with mobility and eager to work on strength as feels he is getting weaker.  Reports not eating well due to missed meals for dialysis and now teeth not fitting well so brought him a Nepro.  Patient tolerating mobility at bedside with desaturation on 4L O2 so increased to 6L.  Patient able to perform sit to stand several times with rest breaks.  He remains appropriate for SNF level rehab at d/c.    Recommendations for follow up therapy are one component of a multi-disciplinary discharge planning process, led by the attending physician.  Recommendations may be updated based on patient status, additional functional criteria and insurance authorization.  Follow Up Recommendations  Skilled nursing-short term rehab (<3 hours/day) Can patient physically be transported by private vehicle: Yes   Assistance Recommended at Discharge Intermittent Supervision/Assistance  Patient can return home with the following A little help with walking and/or transfers;A little help with bathing/dressing/bathroom;Assistance with cooking/housework;Direct supervision/assist for financial management;Direct supervision/assist for medications management;Assist for transportation;Help with stairs or ramp for entrance   Equipment Recommendations  None recommended by PT    Recommendations for Other Services       Precautions / Restrictions Precautions Precautions: Fall Precaution Comments: monitor O2     Mobility  Bed Mobility Overal bed mobility: Needs Assistance Bed Mobility: Supine to Sit     Supine to sit: Supervision, Min assist Sit to supine:  Supervision   General bed mobility comments: assist for lifting trunk initially, then to supine to sit up on opposite side of bed to get to chair end of session    Transfers Overall transfer level: Needs assistance Equipment used: Rolling walker (2 wheels) Transfers: Sit to/from Stand, Bed to chair/wheelchair/BSC Sit to Stand: Min assist, From elevated surface   Step pivot transfers: Min assist, From elevated surface       General transfer comment: stood at bedside for marching in place x 2 then sit<>stand x 3 then transferred to recliner with RW and min A for all; bed height elevated for leg length    Ambulation/Gait               General Gait Details: NT due to limited activity tolerance and O2 dependence   Stairs             Wheelchair Mobility    Modified Rankin (Stroke Patients Only)       Balance Overall balance assessment: Needs assistance Sitting-balance support: Feet supported Sitting balance-Leahy Scale: Fair     Standing balance support: Bilateral upper extremity supported Standing balance-Leahy Scale: Poor                              Cognition Arousal/Alertness: Awake/alert Behavior During Therapy: WFL for tasks assessed/performed Overall Cognitive Status: Within Functional Limits for tasks assessed                                 General Comments: seems WNL for PT session, not formally tested; but aware of [facility], issues with SpO2 and mobility,  etc        Exercises General Exercises - Lower Extremity Hip Flexion/Marching: 10 reps, Both, Standing (x 2) Other Exercises Other Exercises: sit<>stand x 3    General Comments General comments (skin integrity, edema, etc.): on 4L O2 SpO2 down to 85% after first marching in place, increased time to recover so increased to 6L O2 and back to 91% and did not desat rest of session      Pertinent Vitals/Pain Pain Assessment Faces Pain Scale: Hurts little  more Pain Location: chronic back pain Pain Descriptors / Indicators: Aching, Discomfort Pain Intervention(s): Monitored during session, Repositioned    Home Living Family/patient expects to be discharged to:: Skilled nursing facility                        Prior Function            PT Goals (current goals can now be found in the care plan section) Progress towards PT goals: Progressing toward goals    Frequency    Min 2X/week      PT Plan Current plan remains appropriate    Co-evaluation              AM-PAC PT "6 Clicks" Mobility   Outcome Measure  Help needed turning from your back to your side while in a flat bed without using bedrails?: A Little Help needed moving from lying on your back to sitting on the side of a flat bed without using bedrails?: A Little Help needed moving to and from a bed to a chair (including a wheelchair)?: A Little Help needed standing up from a chair using your arms (e.g., wheelchair or bedside chair)?: A Little Help needed to walk in hospital room?: Total Help needed climbing 3-5 steps with a railing? : Total 6 Click Score: 14    End of Session Equipment Utilized During Treatment: Gait belt;Oxygen Activity Tolerance: Patient limited by fatigue Patient left: in chair;with call bell/phone within reach;with chair alarm set   PT Visit Diagnosis: Unsteadiness on feet (R26.81);Other abnormalities of gait and mobility (R26.89);Muscle weakness (generalized) (M62.81);History of falling (Z91.81);Difficulty in walking, not elsewhere classified (R26.2);Pain Pain - part of body:  (back)     Time: 3888-2800 PT Time Calculation (min) (ACUTE ONLY): 35 min  Charges:  $Therapeutic Exercise: 8-22 mins $Therapeutic Activity: 8-22 mins                     Magda Kiel, PT Acute Rehabilitation Services Office:[phone] 01/27/2022    Reginia Naas 01/27/2022, 4:36 PM

## 2022-01-27 NOTE — TOC Initial Note (Signed)
Transition of Care [facility]) - Initial/Assessment Note    Patient Details  Name: Wayne Meadows MRN: [id number] Date of Birth: [birth year]  Transition of Care [facility]) CM/SW Contact:    Benard Halsted, LCSW Phone Number: 01/27/2022, 3:25 PM  Clinical Narrative:                 CSW following for medical readiness to return to [facility].   Expected Discharge Plan: Skilled Nursing Facility Barriers to Discharge: Barriers Resolved   Patient Goals and CMS Choice Patient states their goals for this hospitalization and ongoing recovery are:: Return to snf CMS Medicare.gov Compare Post Acute Care list provided to:: Patient Choice offered to / list presented to : Patient  Expected Discharge Plan and Services Expected Discharge Plan: [place] In-house Referral: Clinical Social Work   Post Acute Care Choice: [place] Living arrangements for the past 2 months: [place] Expected Discharge Date: 01/27/22                                    Prior Living Arrangements/Services Living arrangements for the past 2 months: [place] Lives with:: Facility Resident Patient language and need for interpreter reviewed:: Yes Do you feel safe going back to the place where you live?: Yes      Need for Family Participation in Patient Care: Yes (Comment) Care giver support system in place?: Yes (comment)   Criminal Activity/Legal Involvement Pertinent to Current Situation/Hospitalization: No - Comment as needed  Activities of Daily Living Home Assistive Devices/Equipment: None (From SNF all devices and equipment provided by facility) ADL Screening (condition at time of admission) Patient's cognitive ability adequate to safely complete daily activities?: Yes Is the patient deaf or have difficulty hearing?: No Does the patient have difficulty seeing, even when wearing glasses/contacts?: No Does the patient have difficulty  concentrating, remembering, or making decisions?: No Patient able to express need for assistance with ADLs?: Yes Does the patient have difficulty dressing or bathing?: Yes Independently performs ADLs?: No Communication: Independent Dressing (OT): Needs assistance Is this a change from baseline?: Pre-admission baseline Grooming: Needs assistance Is this a change from baseline?: Pre-admission baseline Feeding: Independent Bathing: Needs assistance Is this a change from baseline?: Pre-admission baseline Toileting: Needs assistance Is this a change from baseline?: Pre-admission baseline In/Out Bed: Needs assistance Is this a change from baseline?: Pre-admission baseline Walks in Home: Needs assistance Is this a change from baseline?: Pre-admission baseline Does the patient have difficulty walking or climbing stairs?: Yes Weakness of Legs: Both Weakness of Arms/Hands: Both  Permission Sought/Granted Permission sought to share information with : Investment banker, corporate granted to share info w AGENCY: [facility]        Emotional Assessment Appearance:: Appears stated age     Orientation: : Oriented to Self, Oriented to Place, Oriented to Situation, Oriented to  Time Alcohol / Substance Use: Not Applicable Psych Involvement: No (comment)  Admission diagnosis:  Shortness of breath [R06.02] Respiratory distress [R06.03] ESRD (end stage renal disease) ([place]) [N18.6] COPD exacerbation ([place]) [J44.1] Acute respiratory failure with hypoxia ([place]) [J96.01] Patient Active Problem List   Diagnosis Date Noted   Acute respiratory failure with hypoxia ([place]) 01/20/2022   Hematemesis with nausea    Erosive esophagitis    Hiatal hernia    UGI bleed 11/05/2021   Lives in nursing home - [facility] 11/05/2021  COVID-19 virus infection 11/05/2021   Thoracic compression fracture, sequela 10/30/2021   Multiple fractures of ribs, bilateral, initial encounter for closed  fracture 10/30/2021   Back pain    Chronic respiratory failure with hypoxia (HCC) - 2.5 L/min continuously 09/28/2021   Paroxysmal atrial flutter ([place]) 09/28/2021   ESRD (end stage renal disease) ([place]) 09/11/2021   Gastric ulcer without hemorrhage or perforation    Duodenal ulcer    ESRD (end stage renal disease) on MWF dialysis ([place]) 04/06/2021   Proteinuria 03/07/2021   Acquired hypothyroidism 03/05/2021   Anxiety 03/05/2021   Chronic pancreatitis ([place]) 12/24/2020   Esophageal stricture    Benign neoplasm of transverse colon    Benign neoplasm of colon    Dysphagia    Renal cell cancer ([place])    Essential hypertension    Centrilobular emphysema ([place])    Cholelithiasis 04/26/2011   Paroxysmal atrial fibrillation ([place]) - not on systemic anticoagulation due to fall risk 04/26/2011   Lung nodule 03/02/2011   Liver lesion 03/02/2011   Other hyperlipidemia 03/02/2011   Abnormal CT of the abdomen 02/23/2011   Non Q wave myocardial infarction ([place]) 02/22/2011    Class: Acute   History of CVA (cerebrovascular accident) 02/22/2011   CAD (coronary artery disease) 02/22/2011   COPD exacerbation ([place]) 02/22/2011   Tobacco abuse, 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/22/2011   PCP:  Clinic, [place]:  No Pharmacies Listed    Social Determinants of Health (SDOH) Interventions    Readmission Risk Interventions    01/27/2022    3:25 PM 09/30/2021    2:03 PM 03/21/2021    1:21 PM  Readmission Risk Prevention Plan  Transportation Screening Complete Complete Complete  Medication Review Press photographer) Complete Complete   PCP or Specialist appointment within 3-5 days of discharge Complete Complete   HRI or Home Care Consult Complete Complete Complete  SW Recovery Care/Counseling Consult Complete Complete Complete  Palliative Care Screening Not Applicable Not Applicable   Skilled Nursing Facility Complete Complete Complete

## 2022-01-27 NOTE — Progress Notes (Signed)
Attempted calling [facility] at [phone] got no any response from the place

## 2022-01-27 NOTE — Progress Notes (Signed)
Pt ordered to discharge to [facility] with [facility]. AVS placed in discharge packet .Assigned nurse in the [place] SNF couldnot be reached out Education provided as needed. Patient verbalized understanding. All questions answered.

## 2022-01-27 NOTE — Discharge Instructions (Signed)
Follow with Primary MD Clinic, [name] in 7 days   Get CBC, BMP, Magnesium  -  checked next visit with your primary MD or SNF MD    Activity: As tolerated with Full fall precautions use walker/cane & assistance as needed  Disposition SNF  Diet: Renal diet with 1.5 L fluid restriction per day  Special Instructions: If you have smoked or chewed Tobacco  in the last 2 yrs please stop smoking, stop any regular Alcohol  and or any Recreational drug use.  On your next visit with your primary care physician please Get Medicines reviewed and adjusted.  Please request your Prim.MD to go over all Hospital Tests and Procedure/Radiological results at the follow up, please get all Hospital records sent to your Prim MD by signing hospital release before you go home.  If you experience worsening of your admission symptoms, develop shortness of breath, life threatening emergency, suicidal or homicidal thoughts you must seek medical attention immediately by calling 911 or calling your MD immediately  if symptoms less severe.  You Must read complete instructions/literature along with all the possible adverse reactions/side effects for all the Medicines you take and that have been prescribed to you. Take any new Medicines after you have completely understood and accpet all the possible adverse reactions/side effects.

## 2022-01-28 DIAGNOSIS — E782 Mixed hyperlipidemia: Secondary | ICD-10-CM | POA: Diagnosis not present

## 2022-01-28 DIAGNOSIS — I10 Essential (primary) hypertension: Secondary | ICD-10-CM | POA: Diagnosis not present

## 2022-01-28 DIAGNOSIS — I639 Cerebral infarction, unspecified: Secondary | ICD-10-CM | POA: Diagnosis not present

## 2022-01-28 DIAGNOSIS — I2510 Atherosclerotic heart disease of native coronary artery without angina pectoris: Secondary | ICD-10-CM | POA: Diagnosis not present

## 2022-01-28 DIAGNOSIS — D638 Anemia in other chronic diseases classified elsewhere: Secondary | ICD-10-CM | POA: Diagnosis not present

## 2022-01-28 DIAGNOSIS — J90 Pleural effusion, not elsewhere classified: Secondary | ICD-10-CM | POA: Diagnosis not present

## 2022-01-28 DIAGNOSIS — G894 Chronic pain syndrome: Secondary | ICD-10-CM | POA: Diagnosis not present

## 2022-01-28 DIAGNOSIS — E039 Hypothyroidism, unspecified: Secondary | ICD-10-CM | POA: Diagnosis not present

## 2022-01-28 DIAGNOSIS — K259 Gastric ulcer, unspecified as acute or chronic, without hemorrhage or perforation: Secondary | ICD-10-CM | POA: Diagnosis not present

## 2022-01-28 DIAGNOSIS — I480 Paroxysmal atrial fibrillation: Secondary | ICD-10-CM | POA: Diagnosis not present

## 2022-01-28 DIAGNOSIS — N186 End stage renal disease: Secondary | ICD-10-CM | POA: Diagnosis not present

## 2022-01-28 DIAGNOSIS — J449 Chronic obstructive pulmonary disease, unspecified: Secondary | ICD-10-CM | POA: Diagnosis not present

## 2022-01-29 DIAGNOSIS — L89134 Pressure ulcer of right lower back, stage 4: Secondary | ICD-10-CM | POA: Diagnosis not present

## 2022-01-29 DIAGNOSIS — J189 Pneumonia, unspecified organism: Secondary | ICD-10-CM | POA: Diagnosis not present

## 2022-01-29 DIAGNOSIS — J441 Chronic obstructive pulmonary disease with (acute) exacerbation: Secondary | ICD-10-CM | POA: Diagnosis not present

## 2022-01-29 DIAGNOSIS — D500 Iron deficiency anemia secondary to blood loss (chronic): Secondary | ICD-10-CM | POA: Diagnosis not present

## 2022-01-29 DIAGNOSIS — E1122 Type 2 diabetes mellitus with diabetic chronic kidney disease: Secondary | ICD-10-CM | POA: Diagnosis not present

## 2022-01-29 DIAGNOSIS — E46 Unspecified protein-calorie malnutrition: Secondary | ICD-10-CM | POA: Diagnosis not present

## 2022-01-29 DIAGNOSIS — N186 End stage renal disease: Secondary | ICD-10-CM | POA: Diagnosis not present

## 2022-02-02 DIAGNOSIS — N390 Urinary tract infection, site not specified: Secondary | ICD-10-CM | POA: Diagnosis not present

## 2022-02-03 DIAGNOSIS — N390 Urinary tract infection, site not specified: Secondary | ICD-10-CM | POA: Diagnosis not present

## 2022-02-03 DIAGNOSIS — G894 Chronic pain syndrome: Secondary | ICD-10-CM | POA: Diagnosis not present

## 2022-02-03 DIAGNOSIS — F411 Generalized anxiety disorder: Secondary | ICD-10-CM | POA: Diagnosis not present

## 2022-02-03 DIAGNOSIS — J449 Chronic obstructive pulmonary disease, unspecified: Secondary | ICD-10-CM | POA: Diagnosis not present

## 2022-02-03 DIAGNOSIS — I480 Paroxysmal atrial fibrillation: Secondary | ICD-10-CM | POA: Diagnosis not present

## 2022-02-03 DIAGNOSIS — I120 Hypertensive chronic kidney disease with stage 5 chronic kidney disease or end stage renal disease: Secondary | ICD-10-CM | POA: Diagnosis not present

## 2022-02-04 DIAGNOSIS — R2681 Unsteadiness on feet: Secondary | ICD-10-CM | POA: Diagnosis not present

## 2022-02-04 DIAGNOSIS — J9611 Chronic respiratory failure with hypoxia: Secondary | ICD-10-CM | POA: Diagnosis not present

## 2022-02-04 DIAGNOSIS — R293 Abnormal posture: Secondary | ICD-10-CM | POA: Diagnosis not present

## 2022-02-04 DIAGNOSIS — J690 Pneumonitis due to inhalation of food and vomit: Secondary | ICD-10-CM | POA: Diagnosis not present

## 2022-02-04 DIAGNOSIS — M6281 Muscle weakness (generalized): Secondary | ICD-10-CM | POA: Diagnosis not present

## 2022-02-04 DIAGNOSIS — R278 Other lack of coordination: Secondary | ICD-10-CM | POA: Diagnosis not present

## 2022-02-04 DIAGNOSIS — S22060S Wedge compression fracture of T7-T8 vertebra, sequela: Secondary | ICD-10-CM | POA: Diagnosis not present

## 2022-02-04 DIAGNOSIS — N186 End stage renal disease: Secondary | ICD-10-CM | POA: Diagnosis not present

## 2022-02-04 DIAGNOSIS — R262 Difficulty in walking, not elsewhere classified: Secondary | ICD-10-CM | POA: Diagnosis not present

## 2022-02-04 DIAGNOSIS — J449 Chronic obstructive pulmonary disease, unspecified: Secondary | ICD-10-CM | POA: Diagnosis not present

## 2022-02-04 DIAGNOSIS — S2243XS Multiple fractures of ribs, bilateral, sequela: Secondary | ICD-10-CM | POA: Diagnosis not present

## 2022-02-05 DIAGNOSIS — S22060S Wedge compression fracture of T7-T8 vertebra, sequela: Secondary | ICD-10-CM | POA: Diagnosis not present

## 2022-02-05 DIAGNOSIS — N186 End stage renal disease: Secondary | ICD-10-CM | POA: Diagnosis not present

## 2022-02-05 DIAGNOSIS — J449 Chronic obstructive pulmonary disease, unspecified: Secondary | ICD-10-CM | POA: Diagnosis not present

## 2022-02-05 DIAGNOSIS — J690 Pneumonitis due to inhalation of food and vomit: Secondary | ICD-10-CM | POA: Diagnosis not present

## 2022-02-05 DIAGNOSIS — J9611 Chronic respiratory failure with hypoxia: Secondary | ICD-10-CM | POA: Diagnosis not present

## 2022-02-05 DIAGNOSIS — S2243XS Multiple fractures of ribs, bilateral, sequela: Secondary | ICD-10-CM | POA: Diagnosis not present

## 2022-02-05 DIAGNOSIS — E1122 Type 2 diabetes mellitus with diabetic chronic kidney disease: Secondary | ICD-10-CM | POA: Diagnosis not present

## 2022-02-05 DIAGNOSIS — L89134 Pressure ulcer of right lower back, stage 4: Secondary | ICD-10-CM | POA: Diagnosis not present

## 2022-02-06 DIAGNOSIS — J9611 Chronic respiratory failure with hypoxia: Secondary | ICD-10-CM | POA: Diagnosis not present

## 2022-02-06 DIAGNOSIS — S2243XS Multiple fractures of ribs, bilateral, sequela: Secondary | ICD-10-CM | POA: Diagnosis not present

## 2022-02-06 DIAGNOSIS — J690 Pneumonitis due to inhalation of food and vomit: Secondary | ICD-10-CM | POA: Diagnosis not present

## 2022-02-06 DIAGNOSIS — J449 Chronic obstructive pulmonary disease, unspecified: Secondary | ICD-10-CM | POA: Diagnosis not present

## 2022-02-06 DIAGNOSIS — S22060S Wedge compression fracture of T7-T8 vertebra, sequela: Secondary | ICD-10-CM | POA: Diagnosis not present

## 2022-02-06 DIAGNOSIS — N186 End stage renal disease: Secondary | ICD-10-CM | POA: Diagnosis not present

## 2022-02-09 DIAGNOSIS — N186 End stage renal disease: Secondary | ICD-10-CM | POA: Diagnosis not present

## 2022-02-09 DIAGNOSIS — J9611 Chronic respiratory failure with hypoxia: Secondary | ICD-10-CM | POA: Diagnosis not present

## 2022-02-09 DIAGNOSIS — J449 Chronic obstructive pulmonary disease, unspecified: Secondary | ICD-10-CM | POA: Diagnosis not present

## 2022-02-09 DIAGNOSIS — S2243XS Multiple fractures of ribs, bilateral, sequela: Secondary | ICD-10-CM | POA: Diagnosis not present

## 2022-02-09 DIAGNOSIS — S22060S Wedge compression fracture of T7-T8 vertebra, sequela: Secondary | ICD-10-CM | POA: Diagnosis not present

## 2022-02-09 DIAGNOSIS — J690 Pneumonitis due to inhalation of food and vomit: Secondary | ICD-10-CM | POA: Diagnosis not present

## 2022-02-10 DIAGNOSIS — J9611 Chronic respiratory failure with hypoxia: Secondary | ICD-10-CM | POA: Diagnosis not present

## 2022-02-10 DIAGNOSIS — G894 Chronic pain syndrome: Secondary | ICD-10-CM | POA: Diagnosis not present

## 2022-02-10 DIAGNOSIS — J449 Chronic obstructive pulmonary disease, unspecified: Secondary | ICD-10-CM | POA: Diagnosis not present

## 2022-02-10 DIAGNOSIS — S2243XS Multiple fractures of ribs, bilateral, sequela: Secondary | ICD-10-CM | POA: Diagnosis not present

## 2022-02-10 DIAGNOSIS — I480 Paroxysmal atrial fibrillation: Secondary | ICD-10-CM | POA: Diagnosis not present

## 2022-02-10 DIAGNOSIS — F411 Generalized anxiety disorder: Secondary | ICD-10-CM | POA: Diagnosis not present

## 2022-02-10 DIAGNOSIS — J690 Pneumonitis due to inhalation of food and vomit: Secondary | ICD-10-CM | POA: Diagnosis not present

## 2022-02-10 DIAGNOSIS — L299 Pruritus, unspecified: Secondary | ICD-10-CM | POA: Diagnosis not present

## 2022-02-10 DIAGNOSIS — S22060S Wedge compression fracture of T7-T8 vertebra, sequela: Secondary | ICD-10-CM | POA: Diagnosis not present

## 2022-02-10 DIAGNOSIS — I10 Essential (primary) hypertension: Secondary | ICD-10-CM | POA: Diagnosis not present

## 2022-02-10 DIAGNOSIS — N186 End stage renal disease: Secondary | ICD-10-CM | POA: Diagnosis not present

## 2022-02-12 DIAGNOSIS — S22060S Wedge compression fracture of T7-T8 vertebra, sequela: Secondary | ICD-10-CM | POA: Diagnosis not present

## 2022-02-12 DIAGNOSIS — N186 End stage renal disease: Secondary | ICD-10-CM | POA: Diagnosis not present

## 2022-02-12 DIAGNOSIS — S2243XS Multiple fractures of ribs, bilateral, sequela: Secondary | ICD-10-CM | POA: Diagnosis not present

## 2022-02-12 DIAGNOSIS — J9611 Chronic respiratory failure with hypoxia: Secondary | ICD-10-CM | POA: Diagnosis not present

## 2022-02-12 DIAGNOSIS — J690 Pneumonitis due to inhalation of food and vomit: Secondary | ICD-10-CM | POA: Diagnosis not present

## 2022-02-12 DIAGNOSIS — J449 Chronic obstructive pulmonary disease, unspecified: Secondary | ICD-10-CM | POA: Diagnosis not present

## 2022-02-12 DIAGNOSIS — E1122 Type 2 diabetes mellitus with diabetic chronic kidney disease: Secondary | ICD-10-CM | POA: Diagnosis not present

## 2022-02-12 DIAGNOSIS — L89134 Pressure ulcer of right lower back, stage 4: Secondary | ICD-10-CM | POA: Diagnosis not present

## 2022-02-13 DIAGNOSIS — S22060S Wedge compression fracture of T7-T8 vertebra, sequela: Secondary | ICD-10-CM | POA: Diagnosis not present

## 2022-02-13 DIAGNOSIS — N186 End stage renal disease: Secondary | ICD-10-CM | POA: Diagnosis not present

## 2022-02-13 DIAGNOSIS — S2243XS Multiple fractures of ribs, bilateral, sequela: Secondary | ICD-10-CM | POA: Diagnosis not present

## 2022-02-13 DIAGNOSIS — J9611 Chronic respiratory failure with hypoxia: Secondary | ICD-10-CM | POA: Diagnosis not present

## 2022-02-13 DIAGNOSIS — J690 Pneumonitis due to inhalation of food and vomit: Secondary | ICD-10-CM | POA: Diagnosis not present

## 2022-02-13 DIAGNOSIS — J449 Chronic obstructive pulmonary disease, unspecified: Secondary | ICD-10-CM | POA: Diagnosis not present

## 2022-02-15 DIAGNOSIS — S22060S Wedge compression fracture of T7-T8 vertebra, sequela: Secondary | ICD-10-CM | POA: Diagnosis not present

## 2022-02-15 DIAGNOSIS — S2243XS Multiple fractures of ribs, bilateral, sequela: Secondary | ICD-10-CM | POA: Diagnosis not present

## 2022-02-15 DIAGNOSIS — J9611 Chronic respiratory failure with hypoxia: Secondary | ICD-10-CM | POA: Diagnosis not present

## 2022-02-15 DIAGNOSIS — J690 Pneumonitis due to inhalation of food and vomit: Secondary | ICD-10-CM | POA: Diagnosis not present

## 2022-02-15 DIAGNOSIS — N186 End stage renal disease: Secondary | ICD-10-CM | POA: Diagnosis not present

## 2022-02-15 DIAGNOSIS — J449 Chronic obstructive pulmonary disease, unspecified: Secondary | ICD-10-CM | POA: Diagnosis not present

## 2022-02-17 DIAGNOSIS — J690 Pneumonitis due to inhalation of food and vomit: Secondary | ICD-10-CM | POA: Diagnosis not present

## 2022-02-17 DIAGNOSIS — S22060S Wedge compression fracture of T7-T8 vertebra, sequela: Secondary | ICD-10-CM | POA: Diagnosis not present

## 2022-02-17 DIAGNOSIS — S2243XS Multiple fractures of ribs, bilateral, sequela: Secondary | ICD-10-CM | POA: Diagnosis not present

## 2022-02-17 DIAGNOSIS — J9611 Chronic respiratory failure with hypoxia: Secondary | ICD-10-CM | POA: Diagnosis not present

## 2022-02-17 DIAGNOSIS — N186 End stage renal disease: Secondary | ICD-10-CM | POA: Diagnosis not present

## 2022-02-17 DIAGNOSIS — J449 Chronic obstructive pulmonary disease, unspecified: Secondary | ICD-10-CM | POA: Diagnosis not present

## 2022-02-18 DIAGNOSIS — J9611 Chronic respiratory failure with hypoxia: Secondary | ICD-10-CM | POA: Diagnosis not present

## 2022-02-18 DIAGNOSIS — S22060S Wedge compression fracture of T7-T8 vertebra, sequela: Secondary | ICD-10-CM | POA: Diagnosis not present

## 2022-02-18 DIAGNOSIS — J690 Pneumonitis due to inhalation of food and vomit: Secondary | ICD-10-CM | POA: Diagnosis not present

## 2022-02-18 DIAGNOSIS — J449 Chronic obstructive pulmonary disease, unspecified: Secondary | ICD-10-CM | POA: Diagnosis not present

## 2022-02-18 DIAGNOSIS — S2243XS Multiple fractures of ribs, bilateral, sequela: Secondary | ICD-10-CM | POA: Diagnosis not present

## 2022-02-18 DIAGNOSIS — N186 End stage renal disease: Secondary | ICD-10-CM | POA: Diagnosis not present

## 2022-02-19 DIAGNOSIS — J449 Chronic obstructive pulmonary disease, unspecified: Secondary | ICD-10-CM | POA: Diagnosis not present

## 2022-02-19 DIAGNOSIS — L89134 Pressure ulcer of right lower back, stage 4: Secondary | ICD-10-CM | POA: Diagnosis not present

## 2022-02-19 DIAGNOSIS — S22060S Wedge compression fracture of T7-T8 vertebra, sequela: Secondary | ICD-10-CM | POA: Diagnosis not present

## 2022-02-19 DIAGNOSIS — N186 End stage renal disease: Secondary | ICD-10-CM | POA: Diagnosis not present

## 2022-02-19 DIAGNOSIS — S2243XS Multiple fractures of ribs, bilateral, sequela: Secondary | ICD-10-CM | POA: Diagnosis not present

## 2022-02-19 DIAGNOSIS — J690 Pneumonitis due to inhalation of food and vomit: Secondary | ICD-10-CM | POA: Diagnosis not present

## 2022-02-19 DIAGNOSIS — E1122 Type 2 diabetes mellitus with diabetic chronic kidney disease: Secondary | ICD-10-CM | POA: Diagnosis not present

## 2022-02-19 DIAGNOSIS — J9611 Chronic respiratory failure with hypoxia: Secondary | ICD-10-CM | POA: Diagnosis not present

## 2022-02-20 DIAGNOSIS — S2243XS Multiple fractures of ribs, bilateral, sequela: Secondary | ICD-10-CM | POA: Diagnosis not present

## 2022-02-20 DIAGNOSIS — J9611 Chronic respiratory failure with hypoxia: Secondary | ICD-10-CM | POA: Diagnosis not present

## 2022-02-20 DIAGNOSIS — N186 End stage renal disease: Secondary | ICD-10-CM | POA: Diagnosis not present

## 2022-02-20 DIAGNOSIS — J690 Pneumonitis due to inhalation of food and vomit: Secondary | ICD-10-CM | POA: Diagnosis not present

## 2022-02-20 DIAGNOSIS — J449 Chronic obstructive pulmonary disease, unspecified: Secondary | ICD-10-CM | POA: Diagnosis not present

## 2022-02-20 DIAGNOSIS — S22060S Wedge compression fracture of T7-T8 vertebra, sequela: Secondary | ICD-10-CM | POA: Diagnosis not present

## 2022-02-21 DIAGNOSIS — N186 End stage renal disease: Secondary | ICD-10-CM | POA: Diagnosis not present

## 2022-02-21 DIAGNOSIS — J9611 Chronic respiratory failure with hypoxia: Secondary | ICD-10-CM | POA: Diagnosis not present

## 2022-02-21 DIAGNOSIS — S2243XS Multiple fractures of ribs, bilateral, sequela: Secondary | ICD-10-CM | POA: Diagnosis not present

## 2022-02-21 DIAGNOSIS — J449 Chronic obstructive pulmonary disease, unspecified: Secondary | ICD-10-CM | POA: Diagnosis not present

## 2022-02-21 DIAGNOSIS — J690 Pneumonitis due to inhalation of food and vomit: Secondary | ICD-10-CM | POA: Diagnosis not present

## 2022-02-21 DIAGNOSIS — S22060S Wedge compression fracture of T7-T8 vertebra, sequela: Secondary | ICD-10-CM | POA: Diagnosis not present

## 2022-03-19 ENCOUNTER — Inpatient Hospital Stay (HOSPITAL_COMMUNITY): Payer: No Typology Code available for payment source

## 2022-03-19 ENCOUNTER — Emergency Department (HOSPITAL_COMMUNITY): Payer: No Typology Code available for payment source

## 2022-03-19 ENCOUNTER — Ambulatory Visit: Payer: Medicare Other | Admitting: Nurse Practitioner

## 2022-03-19 ENCOUNTER — Inpatient Hospital Stay (HOSPITAL_COMMUNITY)
Admission: EM | Admit: 2022-03-19 | Discharge: 2022-03-26 | DRG: 853 | Disposition: E | Payer: No Typology Code available for payment source | Source: Skilled Nursing Facility | Attending: Pulmonary Disease | Admitting: Pulmonary Disease

## 2022-03-19 ENCOUNTER — Other Ambulatory Visit: Payer: Self-pay

## 2022-03-19 ENCOUNTER — Inpatient Hospital Stay (HOSPITAL_COMMUNITY): Admission: EM | Disposition: E | Payer: Self-pay | Source: Skilled Nursing Facility | Attending: Pulmonary Disease

## 2022-03-19 DIAGNOSIS — Y95 Nosocomial condition: Secondary | ICD-10-CM | POA: Diagnosis present

## 2022-03-19 DIAGNOSIS — I2510 Atherosclerotic heart disease of native coronary artery without angina pectoris: Secondary | ICD-10-CM | POA: Diagnosis present

## 2022-03-19 DIAGNOSIS — N186 End stage renal disease: Secondary | ICD-10-CM | POA: Diagnosis present

## 2022-03-19 DIAGNOSIS — Z87891 Personal history of nicotine dependence: Secondary | ICD-10-CM

## 2022-03-19 DIAGNOSIS — I480 Paroxysmal atrial fibrillation: Secondary | ICD-10-CM | POA: Diagnosis present

## 2022-03-19 DIAGNOSIS — I10 Essential (primary) hypertension: Secondary | ICD-10-CM

## 2022-03-19 DIAGNOSIS — Z515 Encounter for palliative care: Secondary | ICD-10-CM | POA: Diagnosis not present

## 2022-03-19 DIAGNOSIS — N390 Urinary tract infection, site not specified: Secondary | ICD-10-CM | POA: Diagnosis present

## 2022-03-19 DIAGNOSIS — Z9981 Dependence on supplemental oxygen: Secondary | ICD-10-CM | POA: Diagnosis not present

## 2022-03-19 DIAGNOSIS — Z8616 Personal history of COVID-19: Secondary | ICD-10-CM

## 2022-03-19 DIAGNOSIS — K2210 Ulcer of esophagus without bleeding: Secondary | ICD-10-CM

## 2022-03-19 DIAGNOSIS — Z9911 Dependence on respirator [ventilator] status: Secondary | ICD-10-CM | POA: Diagnosis not present

## 2022-03-19 DIAGNOSIS — I213 ST elevation (STEMI) myocardial infarction of unspecified site: Principal | ICD-10-CM

## 2022-03-19 DIAGNOSIS — J9601 Acute respiratory failure with hypoxia: Secondary | ICD-10-CM

## 2022-03-19 DIAGNOSIS — Z992 Dependence on renal dialysis: Secondary | ICD-10-CM

## 2022-03-19 DIAGNOSIS — Z85528 Personal history of other malignant neoplasm of kidney: Secondary | ICD-10-CM

## 2022-03-19 DIAGNOSIS — I3139 Other pericardial effusion (noninflammatory): Secondary | ICD-10-CM | POA: Diagnosis present

## 2022-03-19 DIAGNOSIS — N281 Cyst of kidney, acquired: Secondary | ICD-10-CM | POA: Diagnosis present

## 2022-03-19 DIAGNOSIS — Z9861 Coronary angioplasty status: Secondary | ICD-10-CM

## 2022-03-19 DIAGNOSIS — Z8249 Family history of ischemic heart disease and other diseases of the circulatory system: Secondary | ICD-10-CM

## 2022-03-19 DIAGNOSIS — I2111 ST elevation (STEMI) myocardial infarction involving right coronary artery: Secondary | ICD-10-CM | POA: Diagnosis present

## 2022-03-19 DIAGNOSIS — J189 Pneumonia, unspecified organism: Secondary | ICD-10-CM | POA: Diagnosis present

## 2022-03-19 DIAGNOSIS — J440 Chronic obstructive pulmonary disease with acute lower respiratory infection: Secondary | ICD-10-CM | POA: Diagnosis present

## 2022-03-19 DIAGNOSIS — Z66 Do not resuscitate: Secondary | ICD-10-CM | POA: Diagnosis present

## 2022-03-19 DIAGNOSIS — I252 Old myocardial infarction: Secondary | ICD-10-CM | POA: Diagnosis not present

## 2022-03-19 DIAGNOSIS — Z887 Allergy status to serum and vaccine status: Secondary | ICD-10-CM

## 2022-03-19 DIAGNOSIS — E872 Acidosis, unspecified: Secondary | ICD-10-CM | POA: Diagnosis present

## 2022-03-19 DIAGNOSIS — R6521 Severe sepsis with septic shock: Secondary | ICD-10-CM

## 2022-03-19 DIAGNOSIS — I4892 Unspecified atrial flutter: Secondary | ICD-10-CM

## 2022-03-19 DIAGNOSIS — I120 Hypertensive chronic kidney disease with stage 5 chronic kidney disease or end stage renal disease: Secondary | ICD-10-CM | POA: Diagnosis present

## 2022-03-19 DIAGNOSIS — R579 Shock, unspecified: Secondary | ICD-10-CM

## 2022-03-19 DIAGNOSIS — R570 Cardiogenic shock: Secondary | ICD-10-CM | POA: Diagnosis present

## 2022-03-19 DIAGNOSIS — Z8673 Personal history of transient ischemic attack (TIA), and cerebral infarction without residual deficits: Secondary | ICD-10-CM

## 2022-03-19 DIAGNOSIS — Z79899 Other long term (current) drug therapy: Secondary | ICD-10-CM

## 2022-03-19 DIAGNOSIS — I219 Acute myocardial infarction, unspecified: Secondary | ICD-10-CM | POA: Diagnosis present

## 2022-03-19 DIAGNOSIS — A419 Sepsis, unspecified organism: Principal | ICD-10-CM

## 2022-03-19 DIAGNOSIS — I442 Atrioventricular block, complete: Secondary | ICD-10-CM | POA: Diagnosis present

## 2022-03-19 DIAGNOSIS — Z79890 Hormone replacement therapy (postmenopausal): Secondary | ICD-10-CM

## 2022-03-19 DIAGNOSIS — Z91030 Bee allergy status: Secondary | ICD-10-CM

## 2022-03-19 HISTORY — PX: RIGHT/LEFT HEART CATH AND CORONARY ANGIOGRAPHY: CATH118266

## 2022-03-19 HISTORY — PX: CORONARY/GRAFT ACUTE MI REVASCULARIZATION: CATH118305

## 2022-03-19 LAB — POCT I-STAT EG7
Acid-base deficit: 1 mmol/L (ref 0.0–2.0)
Acid-base deficit: 2 mmol/L (ref 0.0–2.0)
Bicarbonate: 26.3 mmol/L (ref 20.0–28.0)
Bicarbonate: 27.1 mmol/L (ref 20.0–28.0)
Calcium, Ion: 1.08 mmol/L — ABNORMAL LOW (ref 1.15–1.40)
Calcium, Ion: 1.08 mmol/L — ABNORMAL LOW (ref 1.15–1.40)
HCT: 33 % — ABNORMAL LOW (ref 39.0–52.0)
HCT: 33 % — ABNORMAL LOW (ref 39.0–52.0)
Hemoglobin: 11.2 g/dL — ABNORMAL LOW (ref 13.0–17.0)
Hemoglobin: 11.2 g/dL — ABNORMAL LOW (ref 13.0–17.0)
O2 Saturation: 82 %
O2 Saturation: 84 %
Potassium: 4.8 mmol/L (ref 3.5–5.1)
Potassium: 4.8 mmol/L (ref 3.5–5.1)
Sodium: 137 mmol/L (ref 135–145)
Sodium: 137 mmol/L (ref 135–145)
TCO2: 28 mmol/L (ref 22–32)
TCO2: 29 mmol/L (ref 22–32)
pCO2, Ven: 59 mmHg (ref 44–60)
pCO2, Ven: 60.6 mmHg — ABNORMAL HIGH (ref 44–60)
pH, Ven: 7.257 (ref 7.25–7.43)
pH, Ven: 7.258 (ref 7.25–7.43)
pO2, Ven: 55 mmHg — ABNORMAL HIGH (ref 32–45)
pO2, Ven: 57 mmHg — ABNORMAL HIGH (ref 32–45)

## 2022-03-19 LAB — I-STAT CHEM 8, ED
BUN: 26 mg/dL — ABNORMAL HIGH (ref 8–23)
Calcium, Ion: 0.94 mmol/L — ABNORMAL LOW (ref 1.15–1.40)
Chloride: 100 mmol/L (ref 98–111)
Creatinine, Ser: 4.8 mg/dL — ABNORMAL HIGH (ref 0.61–1.24)
Glucose, Bld: 106 mg/dL — ABNORMAL HIGH (ref 70–99)
HCT: 43 % (ref 39.0–52.0)
Hemoglobin: 14.6 g/dL (ref 13.0–17.0)
Potassium: 5.3 mmol/L — ABNORMAL HIGH (ref 3.5–5.1)
Sodium: 134 mmol/L — ABNORMAL LOW (ref 135–145)
TCO2: 24 mmol/L (ref 22–32)

## 2022-03-19 LAB — COMPREHENSIVE METABOLIC PANEL
ALT: 6 U/L (ref 0–44)
AST: 25 U/L (ref 15–41)
Albumin: 2.9 g/dL — ABNORMAL LOW (ref 3.5–5.0)
Alkaline Phosphatase: 107 U/L (ref 38–126)
Anion gap: 13 (ref 5–15)
BUN: 20 mg/dL (ref 8–23)
CO2: 23 mmol/L (ref 22–32)
Calcium: 8.4 mg/dL — ABNORMAL LOW (ref 8.9–10.3)
Chloride: 98 mmol/L (ref 98–111)
Creatinine, Ser: 4.49 mg/dL — ABNORMAL HIGH (ref 0.61–1.24)
GFR, Estimated: 12 mL/min — ABNORMAL LOW (ref >60)
Glucose, Bld: 109 mg/dL — ABNORMAL HIGH (ref 70–99)
Potassium: 5.4 mmol/L — ABNORMAL HIGH (ref 3.5–5.1)
Sodium: 134 mmol/L — ABNORMAL LOW (ref 135–145)
Total Bilirubin: 1.4 mg/dL — ABNORMAL HIGH (ref 0.3–1.2)
Total Protein: 6.1 g/dL — ABNORMAL LOW (ref 6.5–8.1)

## 2022-03-19 LAB — POCT I-STAT 7, (LYTES, BLD GAS, ICA,H+H)
Acid-base deficit: 16 mmol/L — ABNORMAL HIGH (ref 0.0–2.0)
Acid-base deficit: 1 mmol/L (ref 0.0–2.0)
Acid-base deficit: 1 mmol/L (ref 0.0–2.0)
Bicarbonate: 17.4 mmol/L — ABNORMAL LOW (ref 20.0–28.0)
Bicarbonate: 26.6 mmol/L (ref 20.0–28.0)
Bicarbonate: 28.1 mmol/L — ABNORMAL HIGH (ref 20.0–28.0)
Calcium, Ion: 0.89 mmol/L — CL (ref 1.15–1.40)
Calcium, Ion: 1.03 mmol/L — ABNORMAL LOW (ref 1.15–1.40)
Calcium, Ion: 0.99 mmol/L — ABNORMAL LOW (ref 1.15–1.40)
HCT: 37 % — ABNORMAL LOW (ref 39.0–52.0)
HCT: 32 % — ABNORMAL LOW (ref 39.0–52.0)
HCT: 33 % — ABNORMAL LOW (ref 39.0–52.0)
Hemoglobin: 12.6 g/dL — ABNORMAL LOW (ref 13.0–17.0)
Hemoglobin: 10.9 g/dL — ABNORMAL LOW (ref 13.0–17.0)
Hemoglobin: 11.2 g/dL — ABNORMAL LOW (ref 13.0–17.0)
O2 Saturation: 99 %
O2 Saturation: 100 %
O2 Saturation: 100 %
Patient temperature: 102.8
Potassium: 3.6 mmol/L (ref 3.5–5.1)
Potassium: 4.6 mmol/L (ref 3.5–5.1)
Potassium: 4 mmol/L (ref 3.5–5.1)
Sodium: 113 mmol/L — CL (ref 135–145)
Sodium: 137 mmol/L (ref 135–145)
Sodium: 138 mmol/L (ref 135–145)
TCO2: 20 mmol/L — ABNORMAL LOW (ref 22–32)
TCO2: 28 mmol/L (ref 22–32)
TCO2: 30 mmol/L (ref 22–32)
pCO2 arterial: 86.4 mmHg (ref 32–48)
pCO2 arterial: 55.5 mmHg — ABNORMAL HIGH (ref 32–48)
pCO2 arterial: 74.8 mmHg (ref 32–48)
pH, Arterial: 6.912 — CL (ref 7.35–7.45)
pH, Arterial: 7.288 — ABNORMAL LOW (ref 7.35–7.45)
pH, Arterial: 7.196 — CL (ref 7.35–7.45)
pO2, Arterial: 252 mmHg — ABNORMAL HIGH (ref 83–108)
pO2, Arterial: 339 mmHg — ABNORMAL HIGH (ref 83–108)
pO2, Arterial: 259 mmHg — ABNORMAL HIGH (ref 83–108)

## 2022-03-19 LAB — GLUCOSE, CAPILLARY: Glucose-Capillary: 106 mg/dL — ABNORMAL HIGH (ref 70–99)

## 2022-03-19 LAB — I-STAT VENOUS BLOOD GAS, ED
Acid-Base Excess: 3 mmol/L — ABNORMAL HIGH (ref 0.0–2.0)
Bicarbonate: 25.2 mmol/L (ref 20.0–28.0)
Calcium, Ion: 0.98 mmol/L — ABNORMAL LOW (ref 1.15–1.40)
HCT: 40 % (ref 39.0–52.0)
Hemoglobin: 13.6 g/dL (ref 13.0–17.0)
O2 Saturation: 83 %
Potassium: 5.3 mmol/L — ABNORMAL HIGH (ref 3.5–5.1)
Sodium: 135 mmol/L (ref 135–145)
TCO2: 26 mmol/L (ref 22–32)
pCO2, Ven: 31 mmHg — ABNORMAL LOW (ref 44–60)
pH, Ven: 7.518 — ABNORMAL HIGH (ref 7.25–7.43)
pO2, Ven: 42 mmHg (ref 32–45)

## 2022-03-19 LAB — CBC WITH DIFFERENTIAL/PLATELET
Abs Immature Granulocytes: 0.01 10*3/uL (ref 0.00–0.07)
Basophils Absolute: 0 10*3/uL (ref 0.0–0.1)
Basophils Relative: 1 %
Eosinophils Absolute: 0 10*3/uL (ref 0.0–0.5)
Eosinophils Relative: 1 %
HCT: 41.8 % (ref 39.0–52.0)
Hemoglobin: 12.6 g/dL — ABNORMAL LOW (ref 13.0–17.0)
Immature Granulocytes: 0 %
Lymphocytes Relative: 8 %
Lymphs Abs: 0.3 10*3/uL — ABNORMAL LOW (ref 0.7–4.0)
MCH: 30.1 pg (ref 26.0–34.0)
MCHC: 30.1 g/dL (ref 30.0–36.0)
MCV: 100 fL (ref 80.0–100.0)
Monocytes Absolute: 0.1 10*3/uL (ref 0.1–1.0)
Monocytes Relative: 2 %
Neutro Abs: 3.5 10*3/uL (ref 1.7–7.7)
Neutrophils Relative %: 88 %
Platelets: 199 10*3/uL (ref 150–400)
RBC: 4.18 MIL/uL — ABNORMAL LOW (ref 4.22–5.81)
RDW: 14.5 % (ref 11.5–15.5)
WBC: 3.9 10*3/uL — ABNORMAL LOW (ref 4.0–10.5)
nRBC: 0 % (ref 0.0–0.2)

## 2022-03-19 LAB — POCT ACTIVATED CLOTTING TIME
Activated Clotting Time: 439 seconds
Activated Clotting Time: 234 seconds

## 2022-03-19 LAB — PROTIME-INR
INR: 1.3 — ABNORMAL HIGH (ref 0.8–1.2)
Prothrombin Time: 16.2 seconds — ABNORMAL HIGH (ref 11.4–15.2)

## 2022-03-19 LAB — LACTIC ACID, PLASMA: Lactic Acid, Venous: 2.7 mmol/L (ref 0.5–1.9)

## 2022-03-19 LAB — BRAIN NATRIURETIC PEPTIDE: B Natriuretic Peptide: 1863.7 pg/mL — ABNORMAL HIGH (ref 0.0–100.0)

## 2022-03-19 LAB — TROPONIN I (HIGH SENSITIVITY): Troponin I (High Sensitivity): 1694 ng/L (ref <18)

## 2022-03-19 SURGERY — CORONARY/GRAFT ACUTE MI REVASCULARIZATION
Anesthesia: LOCAL

## 2022-03-19 MED ORDER — VASOPRESSIN 20 UNITS/100 ML INFUSION FOR SHOCK: 0.0000 [IU]/min | INTRAVENOUS | Status: DC

## 2022-03-19 MED ORDER — MIDAZOLAM HCL 2 MG/2ML IJ SOLN
INTRAMUSCULAR | Status: AC
  Filled 2022-03-19: qty 2

## 2022-03-19 MED ORDER — POLYETHYLENE GLYCOL 3350 17 G PO PACK: 17.0000 g | PACK | Freq: Every day | ORAL | Status: DC | PRN

## 2022-03-19 MED ORDER — ONDANSETRON HCL 4 MG/2ML IJ SOLN
4.0000 mg | Freq: Once | INTRAMUSCULAR | Status: AC
  Administered 2022-03-19: 4 mg via INTRAVENOUS
  Filled 2022-03-19: qty 2

## 2022-03-19 MED ORDER — FENTANYL CITRATE (PF) 100 MCG/2ML IJ SOLN
INTRAMUSCULAR | Status: DC | PRN
  Administered 2022-03-19: 50 ug via INTRAVENOUS

## 2022-03-19 MED ORDER — MIDAZOLAM HCL 2 MG/2ML IJ SOLN
INTRAMUSCULAR | Status: DC | PRN
  Administered 2022-03-19: 2 mg
  Administered 2022-03-19: 2 mg via INTRAVENOUS

## 2022-03-19 MED ORDER — SODIUM CHLORIDE 0.9 % IV SOLN: 1.0000 g | INTRAVENOUS | Status: DC

## 2022-03-19 MED ORDER — LIDOCAINE HCL (PF) 1 % IJ SOLN
INTRAMUSCULAR | Status: AC
  Filled 2022-03-19: qty 30

## 2022-03-19 MED ORDER — VANCOMYCIN HCL 1750 MG/350ML IV SOLN
1750.0000 mg | Freq: Once | INTRAVENOUS | Status: DC
  Filled 2022-03-19: qty 350

## 2022-03-19 MED ORDER — SODIUM BICARBONATE 8.4 % IV SOLN
INTRAVENOUS | Status: AC
  Filled 2022-03-19: qty 100

## 2022-03-19 MED ORDER — ONDANSETRON HCL 4 MG/2ML IJ SOLN: 4.0000 mg | Freq: Four times a day (QID) | INTRAMUSCULAR | Status: DC | PRN

## 2022-03-19 MED ORDER — SODIUM CHLORIDE 0.9% FLUSH: 3.0000 mL | Freq: Two times a day (BID) | INTRAVENOUS | Status: DC

## 2022-03-19 MED ORDER — METOPROLOL TARTRATE 5 MG/5ML IV SOLN: 2.5000 mg | Freq: Once | INTRAVENOUS | Status: DC

## 2022-03-19 MED ORDER — EPINEPHRINE HCL 5 MG/250ML IV SOLN IN NS
INTRAVENOUS | Status: AC | PRN
  Administered 2022-03-19: 15 ug/min via INTRAVENOUS

## 2022-03-19 MED ORDER — MIDAZOLAM BOLUS VIA INFUSION: 0.0000 mg | INTRAVENOUS | Status: DC | PRN

## 2022-03-19 MED ORDER — SODIUM BICARBONATE 8.4 % IV SOLN
INTRAVENOUS | Status: DC
  Filled 2022-03-19: qty 1000

## 2022-03-19 MED ORDER — IOHEXOL 350 MG/ML SOLN
INTRAVENOUS | Status: DC | PRN
  Administered 2022-03-19: 140 mL

## 2022-03-19 MED ORDER — ASPIRIN 81 MG PO CHEW
324.0000 mg | CHEWABLE_TABLET | Freq: Once | ORAL | Status: AC
  Administered 2022-03-19: 324 mg via ORAL
  Filled 2022-03-19: qty 4

## 2022-03-19 MED ORDER — NOREPINEPHRINE 4 MG/250ML-% IV SOLN
2.0000 ug/min | INTRAVENOUS | Status: DC
  Administered 2022-03-19: 2 ug/min via INTRAVENOUS

## 2022-03-19 MED ORDER — TICAGRELOR 90 MG PO TABS: 90.0000 mg | ORAL_TABLET | Freq: Two times a day (BID) | ORAL | Status: DC

## 2022-03-19 MED ORDER — MIDAZOLAM-SODIUM CHLORIDE 100-0.9 MG/100ML-% IV SOLN: 0.0000 mg/h | INTRAVENOUS | Status: DC

## 2022-03-19 MED ORDER — FENTANYL BOLUS VIA INFUSION: 25.0000 ug | INTRAVENOUS | Status: DC | PRN

## 2022-03-19 MED ORDER — EPINEPHRINE 1 MG/10ML IJ SOSY
PREFILLED_SYRINGE | INTRAMUSCULAR | Status: AC
  Filled 2022-03-19: qty 10

## 2022-03-19 MED ORDER — FENTANYL CITRATE (PF) 100 MCG/2ML IJ SOLN
INTRAMUSCULAR | Status: AC
  Filled 2022-03-19: qty 2

## 2022-03-19 MED ORDER — FENTANYL 2500MCG IN NS 250ML (10MCG/ML) PREMIX INFUSION: 25.0000 ug/h | INTRAVENOUS | Status: DC

## 2022-03-19 MED ORDER — EPINEPHRINE HCL 5 MG/250ML IV SOLN IN NS
0.5000 ug/min | INTRAVENOUS | Status: DC
  Administered 2022-03-19: 30 ug/min via INTRAVENOUS
  Filled 2022-03-19: qty 250

## 2022-03-19 MED ORDER — ACETAMINOPHEN 325 MG PO TABS: 650.0000 mg | ORAL_TABLET | ORAL | Status: DC | PRN

## 2022-03-19 MED ORDER — NOREPINEPHRINE 4 MG/250ML-% IV SOLN
INTRAVENOUS | Status: AC
  Filled 2022-03-19: qty 250

## 2022-03-19 MED ORDER — HYDRALAZINE HCL 20 MG/ML IJ SOLN: 10.0000 mg | INTRAMUSCULAR | Status: DC | PRN

## 2022-03-19 MED ORDER — IPRATROPIUM-ALBUTEROL 0.5-2.5 (3) MG/3ML IN SOLN
3.0000 mL | Freq: Once | RESPIRATORY_TRACT | Status: AC
  Administered 2022-03-19: 3 mL via RESPIRATORY_TRACT
  Filled 2022-03-19: qty 3

## 2022-03-19 MED ORDER — SODIUM BICARBONATE 8.4 % IV SOLN
INTRAVENOUS | Status: DC | PRN
  Administered 2022-03-19: 100 meq via INTRAVENOUS

## 2022-03-19 MED ORDER — SODIUM CHLORIDE 0.9% FLUSH: 3.0000 mL | INTRAVENOUS | Status: DC | PRN

## 2022-03-19 MED ORDER — PHENYLEPHRINE 80 MCG/ML (10ML) SYRINGE FOR IV PUSH (FOR BLOOD PRESSURE SUPPORT): 80.0000 ug | PREFILLED_SYRINGE | Freq: Once | INTRAVENOUS | Status: DC

## 2022-03-19 MED ORDER — EPINEPHRINE HCL 5 MG/250ML IV SOLN IN NS: 0.5000 ug/min | INTRAVENOUS | Status: DC

## 2022-03-19 MED ORDER — SODIUM CHLORIDE 0.9 % IV SOLN
250.0000 mL | INTRAVENOUS | Status: DC
  Administered 2022-03-19: 250 mL via INTRAVENOUS

## 2022-03-19 MED ORDER — HEPARIN SODIUM (PORCINE) 5000 UNIT/ML IJ SOLN
INTRAMUSCULAR | Status: AC
  Filled 2022-03-19: qty 1

## 2022-03-19 MED ORDER — CANGRELOR BOLUS VIA INFUSION
INTRAVENOUS | Status: DC | PRN
  Administered 2022-03-19: 2178 ug via INTRAVENOUS

## 2022-03-19 MED ORDER — ASPIRIN 81 MG PO CHEW: 81.0000 mg | CHEWABLE_TABLET | Freq: Every day | ORAL | Status: DC

## 2022-03-19 MED ORDER — SODIUM BICARBONATE 8.4 % IV SOLN
INTRAVENOUS | Status: AC
  Administered 2022-03-19: 50 meq
  Filled 2022-03-19: qty 100

## 2022-03-19 MED ORDER — HEPARIN (PORCINE) IN NACL 1000-0.9 UT/500ML-% IV SOLN
INTRAVENOUS | Status: AC
  Filled 2022-03-19: qty 1000

## 2022-03-19 MED ORDER — CANGRELOR TETRASODIUM 50 MG IV SOLR
INTRAVENOUS | Status: AC
  Filled 2022-03-19: qty 50

## 2022-03-19 MED ORDER — SODIUM CHLORIDE 0.9 % IV SOLN
1.0000 g | Freq: Once | INTRAVENOUS | Status: DC
  Filled 2022-03-19: qty 10

## 2022-03-19 MED ORDER — LABETALOL HCL 5 MG/ML IV SOLN: 10.0000 mg | INTRAVENOUS | Status: DC | PRN

## 2022-03-19 MED ORDER — SODIUM BICARBONATE 8.4 % IV SOLN
INTRAVENOUS | Status: AC
  Filled 2022-03-19: qty 50

## 2022-03-19 MED ORDER — DOCUSATE SODIUM 100 MG PO CAPS: 100.0000 mg | ORAL_CAPSULE | Freq: Two times a day (BID) | ORAL | Status: DC | PRN

## 2022-03-19 MED ORDER — PHENYLEPHRINE 80 MCG/ML (10ML) SYRINGE FOR IV PUSH (FOR BLOOD PRESSURE SUPPORT)
PREFILLED_SYRINGE | INTRAVENOUS | Status: AC
  Filled 2022-03-19: qty 20

## 2022-03-19 MED ORDER — NITROGLYCERIN 1 MG/10 ML FOR IR/CATH LAB
INTRA_ARTERIAL | Status: AC
  Filled 2022-03-19: qty 10

## 2022-03-19 MED ORDER — SODIUM CHLORIDE 0.9 % IV BOLUS
750.0000 mL | Freq: Once | INTRAVENOUS | Status: AC
  Administered 2022-03-19: 750 mL via INTRAVENOUS

## 2022-03-19 MED ORDER — SODIUM CHLORIDE 0.9 % IV SOLN
INTRAVENOUS | Status: AC | PRN
  Administered 2022-03-19: 4 ug/kg/min via INTRAVENOUS

## 2022-03-19 MED ORDER — HEPARIN SODIUM (PORCINE) 5000 UNIT/ML IJ SOLN: 5000.0000 [IU] | Freq: Three times a day (TID) | INTRAMUSCULAR | Status: DC

## 2022-03-19 MED ORDER — FENTANYL CITRATE PF 50 MCG/ML IJ SOSY: 25.0000 ug | PREFILLED_SYRINGE | Freq: Once | INTRAMUSCULAR | Status: DC

## 2022-03-19 MED ORDER — NOREPINEPHRINE 4 MG/250ML-% IV SOLN: 0.0000 ug/min | INTRAVENOUS | Status: DC

## 2022-03-19 MED ORDER — MIDAZOLAM BOLUS VIA INFUSION: 2.0000 mg | Freq: Once | INTRAVENOUS | Status: DC

## 2022-03-19 MED ORDER — SODIUM CHLORIDE 0.9 % IV SOLN: 250.0000 mL | INTRAVENOUS | Status: DC | PRN

## 2022-03-19 MED ORDER — LIDOCAINE HCL (PF) 1 % IJ SOLN
INTRAMUSCULAR | Status: DC | PRN
  Administered 2022-03-19: 10 mL

## 2022-03-19 MED ORDER — SODIUM CHLORIDE 0.9 % IV BOLUS: 500.0000 mL | Freq: Once | INTRAVENOUS | Status: DC

## 2022-03-19 MED ORDER — NOREPINEPHRINE BITARTRATE 1 MG/ML IV SOLN
INTRAVENOUS | Status: AC | PRN
  Administered 2022-03-19: 40 ug/min via INTRAVENOUS

## 2022-03-19 MED ORDER — NOREPINEPHRINE 4 MG/250ML-% IV SOLN
0.0000 ug/min | INTRAVENOUS | Status: DC
  Administered 2022-03-19: 60 ug/min via INTRAVENOUS
  Filled 2022-03-19: qty 250

## 2022-03-19 MED ORDER — HEPARIN SODIUM (PORCINE) 1000 UNIT/ML IJ SOLN
INTRAMUSCULAR | Status: DC | PRN
  Administered 2022-03-19: 3000 [IU] via INTRAVENOUS
  Administered 2022-03-19: 6000 [IU] via INTRAVENOUS

## 2022-03-19 MED ORDER — HEPARIN SODIUM (PORCINE) 5000 UNIT/ML IJ SOLN
4000.0000 [IU] | Freq: Once | INTRAMUSCULAR | Status: AC
  Administered 2022-03-19: 4000 [IU] via INTRAVENOUS

## 2022-03-19 SURGICAL SUPPLY — 27 items
BALL SAPPHIRE NC24 4.5X15 (BALLOONS) ×1
BALLN EMERGE MR 2.5X12 (BALLOONS) ×1
BALLOON EMERGE MR 2.5X12 (BALLOONS) IMPLANT
BALLOON SAPPHIRE NC24 4.5X15 (BALLOONS) IMPLANT
CABLE ADAPT PACING TEMP 12FT (ADAPTER) IMPLANT
CATH 5FR JL3.5 JR4 ANG PIG MP (CATHETERS) IMPLANT
CATH INFINITI 5FR JL4 (CATHETERS) IMPLANT
CATH S G BIP PACING (CATHETERS) IMPLANT
CATH SWAN GANZ VIP 7.5F (CATHETERS) IMPLANT
CATH VISTA GUIDE 6FR JR4 (CATHETERS) IMPLANT
GLIDESHEATH SLEND SS 6F .021 (SHEATH) IMPLANT
GUIDEWIRE INQWIRE 1.5J.035X260 (WIRE) IMPLANT
INQWIRE 1.5J .035X260CM (WIRE)
KIT ENCORE 26 ADVANTAGE (KITS) IMPLANT
KIT HEART LEFT (KITS) ×1 IMPLANT
PACK CARDIAC CATHETERIZATION (CUSTOM PROCEDURE TRAY) ×1 IMPLANT
SHEATH PINNACLE 6F 10CM (SHEATH) IMPLANT
SHEATH PINNACLE 8F 10CM (SHEATH) IMPLANT
SHEATH PROBE COVER 6X72 (BAG) IMPLANT
SLEEVE REPOSITIONING LENGTH 30 (MISCELLANEOUS) IMPLANT
STENT SYNERGY XD 4.0X24 (Permanent Stent) IMPLANT
SYNERGY XD 4.0X24 (Permanent Stent) ×1 IMPLANT
TRANSDUCER W/STOPCOCK (MISCELLANEOUS) ×1 IMPLANT
TUBING CIL FLEX 10 FLL-RA (TUBING) ×1 IMPLANT
WIRE COUGAR XT STRL 190CM (WIRE) IMPLANT
WIRE EMERALD 3MM-J .025X260CM (WIRE) IMPLANT
WIRE EMERALD 3MM-J .035X150CM (WIRE) IMPLANT

## 2022-03-20 ENCOUNTER — Encounter (HOSPITAL_COMMUNITY): Payer: Self-pay | Admitting: Cardiovascular Disease

## 2022-03-20 MED FILL — henylephrine-NaCl Pref Syr 0.8 MG/10ML-0.9% (80 MCG/ML): INTRAVENOUS | Qty: 10 | Status: AC

## 2022-03-20 MED FILL — Heparin Sod (Porcine)-NaCl IV Soln 1000 Unit/500ML-0.9%: INTRAVENOUS | Qty: 1000 | Status: AC

## 2022-03-20 MED FILL — Nitroglycerin IV Soln 100 MCG/ML in D5W: INTRA_ARTERIAL | Qty: 10 | Status: AC

## 2022-03-26 NOTE — Progress Notes (Signed)
RT assisted with patient transport on vent from Cath Lab holding to 1T05 without complications.

## 2022-03-26 NOTE — IPAL (Signed)
  Interdisciplinary Goals of Care Family Meeting   Date carried out:: Mar 29, 2022  Location of the meeting: Bedside  Member's involved: Physician and Family Member or next of kin  Durable Power of Attorney or acting medical decision maker: Daughter Wayne Meadows    Discussion: We discussed goals of care for Wayne Meadows .  I explained that Wayne Meadows is suffering from cardiogenic and septic shock in the setting of a STEMI and pneumonia and UTI.  Despite having angioplasty and stenting, fluids, antibiotics and mechanical ventilation his condition continues to deteriorate with refractory shock.  Discussed with the [facility] team who agrees that due to Wayne Meadows comorbid illnesses he is not a candidate for advanced, mechanical circulatory shock treatment.  Given the progressive decline in his blood pressures despite aggressive interventions I explained to his daughter that there are no further treatments which will be ineffective and CPR would cause unnecessary pain and suffering in the event in the inevitable event of a cardiac arrest.    Code status: Full DNR  Disposition: In-patient comfort care   Time spent for the meeting: 25 minutes  [name] Mar 29, 2022, 2:40 PM

## 2022-03-26 NOTE — ED Notes (Signed)
RT at bedside

## 2022-03-26 NOTE — Consult Note (Addendum)
Cardiology Consultation   Wayne [name] MRN: [id number]; DOB: 1934/09/05  Admit date: 04-04-2022 Date of Consult: Apr 04, 2022  PCP:  [facility], [facility] Cardiologist:  Sherren Mocha, MD     Wayne Meadows Profile:   Wayne Meadows is a 88 y.o. male with a hx of COPD, coronary artery disease, history of stroke, end-stage renal disease on HD, paroxysmal atrial fibrillation, carotid disease, HTN, HLD who is being seen 04/04/22 for the evaluation of STEMI at the request of Dr Truett Mainland.  History of Present Illness:   Wayne Meadows is an 88 year old male with above medical history who is followed by Dr. Burt Knack.  Per chart review, Wayne Meadows has extensive history of CAD.  Previously had stents placed at [facility] in 2009.  In 01/2011, Wayne Meadows was admitted for NSTEMI and emergent heart catheterization showed 99% stenosis of large RCA, total occlusion of small first diagonal with some collateral flow.  He had a bare-metal stent placed to his RCA.  After his cath, he developed postprocedure atrial fibrillation.  He was not started on anticoagulation as he had a hemorrhagic renal cyst.  He again was admitted in 02/2011 for unstable angina and underwent cardiac catheterization that showed a patent stent in the proximal-mid RCA.  After 2013, Wayne Meadows was lost to follow-up for several years.  Was admitted in 06/2020 planing of chest pain.  Underwent left heart catheterization on 07/09/2020 that showed stable three-vessel disease with widely patent stent in the proximal ramus intermediate, moderate 40-50% in-stent restenosis in the proximal RCA bare-metal stent, 50% stenosis in mid LAD.  It was recommended that we consider evaluation for causes of not anginal chest pain.  Wayne Meadows was later admitted in 09/2021 for atrial fibrillation.  Cardiogram on 09/30/2021 showed EF 55-60% with no regional wall motion abnormalities, mild LVH, grade 1 diastolic dysfunction, normal RV  systolic function, moderately dilated left atrium and moderately dilated right atrium.  Wayne Meadows was treated with an increased dose of Cardizem and he converted to normal sinus rhythm.  Wayne Meadows was not started on anticoagulation at that time as he had recently fallen and suffered rib and pelvis injuries.  Also had a GI bleed in 03/2021.  Wayne Meadows presented to [facility] via EMS on 04-Apr-2022 due to complaints of shortness of breath.  With EMS, Wayne Meadows was tachycardic with heart rate in the 130s-140s.  EMS reported that Wayne Meadows was hot to the touch and had been feeling nauseous.  Ischial vital signs showed heart rate 132 bpm, respiratory rate 20 breaths/min, temperature 100.6 F, BP 148/59.  EKG in the ED showed ST elevation in leads II, 3, aVF, and slight ST depression in leads V1-V4.  Code STEMI was called.  Wayne Meadows became hypotensive and bradycardic and was started on nor epi and epinephrine drips. He was intubated due to severe respiratory distress. He was transported emergently to the Cath Lab   Past Medical History:  Diagnosis Date   Abnormal echocardiogram 03/08/2021   Abnormal finding on GI tract imaging    Acute blood loss anemia    Acute pancreatitis 08/12/2020   Arteriovenous fistula infection ([place]) 04/18/2021   Arthritis    Carotid stenosis    Community acquired pneumonia 04/06/2021   COPD (chronic obstructive pulmonary disease) (HCC)    Coronary artery disease    S/p PCI 2011;  NSTEMI 12/12:  LHC/PCI 02/23/11: LAD 60% after the septal perforator, D1 occluded with distal collaterals, proximal RI 30-40%, AV circumflex stent patent  with 60% stenosis after the stent, RCA 99%, EF 60-65%.  His RCA was treated with a bare-metal stent   CVA (cerebral infarction) 2011   Right cerebral; total obstruction of the right ICA   Diverticulitis    Hypertension    Paroxysmal atrial fibrillation with RVR ([place]) 04/09/2021   Partial small bowel obstruction ([place]) 08/17/2021   Pleuritic chest pain  04/08/2014   Pressure injury of skin 03/30/2021   Rash 03/05/2021   Rectal bleeding 07/2015   Refractory nausea and vomiting 06/20/2020   Renal carcinoma ([place])    Sepsis, unspecified organism ([place]) 04/11/2016   Stroke ([place])    Tobacco abuse, in remission    Unstable angina ([place]) 03/18/2011    Past Surgical History:  Procedure Laterality Date   AV FISTULA PLACEMENT Right 04/03/2021   Procedure: ARTERIOVENOUS (AV) CREATION OF RIGHT ARM BRACHIOCEPHALIC FISTULA;  Surgeon: Cherre Robins, MD;  Location: [place];  Service: Vascular;  Laterality: Right;   BIOPSY  04/08/2021   Procedure: BIOPSY;  Surgeon: Lavena Bullion, DO;  Location: [place];  Service: Gastroenterology;;   BIOPSY  11/06/2021   Procedure: BIOPSY;  Surgeon: Lavena Bullion, DO;  Location: [place];  Service: Gastroenterology;;   COLON SURGERY     COLONOSCOPY N/A 08/22/2015   Procedure: COLONOSCOPY;  Surgeon: [name], MD;  Location: [place] ENDOSCOPY;  Service: Endoscopy;  Laterality: N/A;   ESOPHAGOGASTRODUODENOSCOPY N/A 07/18/2020   Procedure: ESOPHAGOGASTRODUODENOSCOPY (EGD);  Surgeon: Milus Banister, MD;  Location: [facility];  Service: Endoscopy;  Laterality: N/A;   ESOPHAGOGASTRODUODENOSCOPY (EGD) WITH PROPOFOL N/A 06/21/2020   Procedure: ESOPHAGOGASTRODUODENOSCOPY (EGD) WITH PROPOFOL;  Surgeon: Irene Shipper, MD;  Location: [facility] ENDOSCOPY;  Service: Endoscopy;  Laterality: N/A;   ESOPHAGOGASTRODUODENOSCOPY (EGD) WITH PROPOFOL N/A 04/08/2021   Procedure: ESOPHAGOGASTRODUODENOSCOPY (EGD) WITH PROPOFOL;  Surgeon: Lavena Bullion, DO;  Location: [place];  Service: Gastroenterology;  Laterality: N/A;   ESOPHAGOGASTRODUODENOSCOPY (EGD) WITH PROPOFOL N/A 11/06/2021   Procedure: ESOPHAGOGASTRODUODENOSCOPY (EGD) WITH PROPOFOL;  Surgeon: Lavena Bullion, DO;  Location: [place];  Service: Gastroenterology;  Laterality: N/A;   EUS N/A 07/18/2020   Procedure: UPPER ENDOSCOPIC ULTRASOUND (EUS) RADIAL;  Surgeon:  Milus Banister, MD;  Location: [facility] ENDOSCOPY;  Service: Endoscopy;  Laterality: N/A;   FINE NEEDLE ASPIRATION N/A 07/18/2020   Procedure: FINE NEEDLE ASPIRATION (FNA) LINEAR;  Surgeon: Milus Banister, MD;  Location: [facility] ENDOSCOPY;  Service: Endoscopy;  Laterality: N/A;   HEMOSTASIS CLIP PLACEMENT  04/08/2021   Procedure: HEMOSTASIS CLIP PLACEMENT;  Surgeon: Lavena Bullion, DO;  Location: [place];  Service: Gastroenterology;;   hip relacement     INCISION AND DRAINAGE Right 09/11/2021   Procedure: INCISION AND DRAINAGE OF RIGHT ARM FISTULA;  Surgeon: Cherre Robins, MD;  Location: [facility] OR;  Service: Vascular;  Laterality: Right;   IR FLUORO GUIDE CV LINE RIGHT  03/31/2021   IR REMOVAL TUN CV CATH W/O FL  08/15/2021   IR US GUIDE VASC ACCESS RIGHT  03/31/2021   KIDNEY SURGERY     LEFT HEART CATH AND CORONARY ANGIOGRAPHY N/A 07/09/2020   Procedure: LEFT HEART CATH AND CORONARY ANGIOGRAPHY;  Surgeon: Leonie Man, MD;  Location: [place] CV LAB;  Service: Cardiovascular;  Laterality: N/A;   LEFT HEART CATHETERIZATION WITH CORONARY ANGIOGRAM N/A 02/23/2011   Procedure: LEFT HEART CATHETERIZATION WITH CORONARY ANGIOGRAM;  Surgeon: [name], MD;  Location: [facility] CATH LAB;  Service: Cardiovascular;  Laterality: N/A;   LEFT HEART CATHETERIZATION  WITH CORONARY ANGIOGRAM N/A 03/18/2011   Procedure: LEFT HEART CATHETERIZATION WITH CORONARY ANGIOGRAM;  Surgeon: [name], MD;  Location: [facility] CATH LAB;  Service: Cardiovascular;  Laterality: N/A;   PERCUTANEOUS CORONARY STENT INTERVENTION (PCI-S) N/A 02/23/2011   Procedure: PERCUTANEOUS CORONARY STENT INTERVENTION (PCI-S);  Surgeon: [name], MD;  Location: [facility] CATH LAB;  Service: Cardiovascular;  Laterality: N/A;   TEMPORARY PACEMAKER INSERTION N/A 02/23/2011   Procedure: TEMPORARY PACEMAKER INSERTION;  Surgeon: [name], MD;  Location: [facility] CATH LAB;  Service: Cardiovascular;  Laterality: N/A;   THROMBECTOMY W/ EMBOLECTOMY Right  04/03/2021   Procedure: THROMBECTOMY ARTERIOVENOUS FISTULA;  Surgeon: Cherre Robins, MD;  Location: [facility] OR;  Service: Vascular;  Laterality: Right;     Home Medications:  Prior to Admission medications   Medication Sig Start Date End Date Taking? Authorizing Provider  acetaminophen (TYLENOL) 325 MG tablet Take 2 tablets (650 mg total) by mouth every 6 (six) hours as needed for mild pain (or Fever >/= 101). Wayne Meadows taking differently: Take 650 mg by mouth every 6 (six) hours as needed for mild pain (fever > 101F). 04/10/21   Bonnielee Haff, MD  Amino Acids-Protein Hydrolys (FEEDING SUPPLEMENT, PRO-STAT SUGAR FREE 64,) LIQD Take 30 mLs by mouth every evening.    [provider]  antiseptic oral rinse (BIOTENE) LIQD 10 mLs by Mouth Rinse route 2 (two) times daily.    [provider]  benzonatate (TESSALON) 100 MG capsule Take 1 capsule (100 mg total) by mouth 3 (three) times daily. 10/30/21   Lavina Hamman, MD  busPIRone (BUSPAR) 5 MG tablet Take 1 tablet (5 mg total) by mouth 3 (three) times daily. (0800 & 2000) Wayne Meadows taking differently: Take 5 mg by mouth 3 (three) times daily. 10/30/21   Lavina Hamman, MD  Darbepoetin Alfa (ARANESP) 60 MCG/0.3ML SOSY injection Inject 0.3 mLs (60 mcg total) into the vein every Monday with hemodialysis. Wayne Meadows not taking: Reported on 01/20/2022 09/30/21   Cherene Altes, MD  dextromethorphan-guaiFENesin [facility] DM) 30-600 MG 12hr tablet Take 1 tablet by mouth 2 (two) times daily. 10/30/21   Lavina Hamman, MD  diltiazem (CARDIZEM) 60 MG tablet Take 1 tablet (60 mg total) by mouth 3 (three) times daily. 01/27/22   Thurnell Lose, MD  HYDROcodone-acetaminophen (NORCO) 10-325 MG tablet Take 1 tablet by mouth every 6 (six) hours as needed for severe pain or moderate pain. 01/27/22   Thurnell Lose, MD  hydrOXYzine (ATARAX) 10 MG tablet Take 10 mg by mouth every 6 (six) hours as needed for itching.    [provider]   Ipratropium-Albuterol (COMBIVENT RESPIMAT) 20-100 MCG/ACT AERS respimat Inhale 1 puff into the lungs 4 (four) times daily.    [provider]  ipratropium-albuterol (DUONEB) 0.5-2.5 (3) MG/3ML SOLN Take 3 mLs by nebulization every 4 (four) hours as needed (shortness of breath, wheezing).    [provider]  levothyroxine (SYNTHROID) 75 MCG tablet Take 75 mcg by mouth in the morning. (0630)    [provider]  melatonin 5 MG TABS Take 5 mg by mouth at bedtime.    [provider]  Menthol, Topical Analgesic, (BIOFREEZE) 4 % GEL Apply 1 application  topically 3 (three) times daily as needed (topical analgesic).    [provider]  metoprolol tartrate (LOPRESSOR) 25 MG tablet Take 0.5 tablets (12.5 mg total) by mouth 2 (two) times daily. 10/30/21   Lavina Hamman, MD  Naphazoline-Glycerin (CLEAR EYES REDNESS RELIEF  OP) Place 1-2 drops into the right eye 3 (three) times daily.    [provider]  Nutritional Supplements (NOVASOURCE RENAL) LIQD Take 237 mLs by mouth 2 (two) times daily.    [provider]  ondansetron (ZOFRAN) 4 MG tablet Take 4 mg by mouth every 6 (six) hours as needed for nausea or vomiting.    [provider]  pantoprazole (PROTONIX) 40 MG tablet Take 40 mg by mouth in the morning.    [provider]  polyethylene glycol (MIRALAX / GLYCOLAX) 17 g packet Take 17 g by mouth daily. Wayne Meadows taking differently: Take 17 g by mouth in the morning. 05/01/21   Sherwood Gambler, MD  pravastatin (PRAVACHOL) 40 MG tablet Take 40 mg by mouth at bedtime.    [provider]  senna (SENOKOT) 8.6 MG TABS tablet Take 8.6 mg by mouth 2 (two) times daily.    [provider]  sevelamer carbonate (RENVELA) 800 MG tablet Take 1,600 mg by mouth with breakfast, with lunch, and with evening meal.    [provider]  sodium chloride (OCEAN) 0.65 % SOLN nasal spray Place 2 sprays into both nostrils 2 (two)  times daily.    [provider]  tamsulosin (FLOMAX) 0.4 MG CAPS capsule Take 1 capsule (0.4 mg total) by mouth daily. Wayne Meadows taking differently: Take 0.4 mg by mouth in the morning. 04/11/21   Bonnielee Haff, MD    Inpatient Medications: Scheduled Meds:  [START ON 03/20/2022] aspirin  81 mg Oral Daily   fentaNYL (SUBLIMAZE) injection  25 mcg Intravenous Once   heparin       [START ON 03/20/2022] heparin  5,000 Units Subcutaneous Q8H   metoprolol tartrate  2.5 mg Intravenous Once   phenylephrine  80 mcg Intravenous Once   sodium chloride flush  3 mL Intravenous Q12H   [START ON 03/20/2022] ticagrelor  90 mg Oral BID   Continuous Infusions:  sodium chloride 20 mL/hr at March 30, 2022 1500   sodium chloride     ceFEPime (MAXIPIME) IV     [START ON 03/20/2022] ceFEPime (MAXIPIME) IV     epinephrine 30 mcg/min (March 30, 2022 1500)   fentaNYL infusion INTRAVENOUS 50 mcg/hr (2022/03/30 1500)   midazolam     norepinephrine (LEVOPHED) Adult infusion Stopped (2022-03-30 1447)   sodium bicarbonate 150 mEq in dextrose 5 % 1,150 mL infusion Stopped (2022-03-30 1447)   vancomycin     vasopressin Stopped (2022/03/30 1447)   PRN Meds: sodium chloride, acetaminophen, docusate sodium, fentaNYL, heparin, hydrALAZINE, labetalol, midazolam, ondansetron (ZOFRAN) IV, polyethylene glycol, sodium chloride flush  Allergies:    Allergies  Allergen Reactions   Bee Venom Anaphylaxis   Influenza Vaccines Other (See Comments)    "Mortally sick for 2 weeks"    Social History:   Social History   Socioeconomic History   Marital status: Divorced    Spouse name: Not on file   Number of children: Not on file   Years of education: Not on file   Highest education level: Not on file  Occupational History   Not on file  Tobacco Use   Smoking status: Former    Types: Cigarettes    Quit date: 02/24/2007    Years since quitting: 15.0   Smokeless tobacco: Never  Vaping Use   Vaping Use: Never used  Substance and  Sexual Activity   Alcohol use: Not Currently    Alcohol/week: 1.0 standard drink of alcohol    Types: 1 Standard drinks or equivalent per week  Comment: socially    Drug use: No   Sexual activity: Not on file  Other Topics Concern   Not on file  Social History Narrative   [name] resident for rehab   Social Determinants of Health   Financial Resource Strain: Not on file  Food Insecurity: No Food Insecurity (01/27/2022)   Hunger Vital Sign    Worried About Running Out of Food in the Last Year: Never true    Ran Out of Food in the Last Year: Never true  Transportation Needs: No Transportation Needs (01/27/2022)   PRAPARE - Hydrologist (Medical): No    Lack of Transportation (Non-Medical): No  Physical Activity: Not on file  Stress: Not on file  Social Connections: Not on file  Intimate Partner Violence: Not At Risk (01/27/2022)   Humiliation, Afraid, Rape, and Kick questionnaire    Fear of Current or Ex-Partner: No    Emotionally Abused: No    Physically Abused: No    Sexually Abused: No    Family History:    Family History  Problem Relation Age of Onset   Heart attack Other 84     ROS:  Please see the history of present illness.   All other ROS reviewed and negative.     Physical Exam/Data:   Vitals:   03-21-22 1446 2022/03/21 1447 21-Mar-2022 1448 03/21/22 1449  BP:      Pulse: (!) 141 (!) 173    Resp: (!) 0 (!) 0 (!) 0 (!) 0  SpO2: (!) 67% (!) 71%    Weight:      Height:        Intake/Output Summary (Last 24 hours) at Mar 21, 2022 1506 Last data filed at 03/21/22 1500 Gross per 24 hour  Intake 1021.87 ml  Output --  Net 1021.87 ml      03-21-22   11:00 AM 01/27/2022    4:59 AM 01/26/2022   10:56 PM  Last 3 Weights  Weight (lbs) 160 lb 148 lb 13 oz 145 lb 11.6 oz  Weight (kg) 72.576 kg 67.5 kg 66.1 kg     Body mass index is 21.7 kg/m.   Physical exam per MD   EKG:  The EKG was personally reviewed and demonstrates:   Sinus tachycardia, HR 137 BPM, ST elevation in leads II, III, aVF, and slight ST depression in leads V1-V4   Relevant CV Studies:   Laboratory Data:  High Sensitivity Troponin:   Recent Labs  Lab Mar 21, 2022 1049  TROPONINIHS 1,694*     Chemistry Recent Labs  Lab 2022/03/21 1049 March 21, 2022 1053 2022-03-21 1055 March 21, 2022 1209 03/21/22 1239 03/21/2022 1252 03-21-22 1438  NA 134*   < > 134*   < > 137 137  137 138  K 5.4*   < > 5.3*   < > 4.6 4.8  4.8 4.0  CL 98  --  100  --   --   --   --   CO2 23  --   --   --   --   --   --   GLUCOSE 109*  --  106*  --   --   --   --   BUN 20  --  26*  --   --   --   --   CREATININE 4.49*  --  4.80*  --   --   --   --   CALCIUM 8.4*  --   --   --   --   --   --  GFRNONAA 12*  --   --   --   --   --   --   ANIONGAP 13  --   --   --   --   --   --    < > = values in this interval not displayed.    Recent Labs  Lab 2022/03/21 1049  PROT 6.1*  ALBUMIN 2.9*  AST 25  ALT 6  ALKPHOS 107  BILITOT 1.4*   Lipids No results for input(s): "CHOL", "TRIG", "HDL", "LABVLDL", "LDLCALC", "CHOLHDL" in the last 168 hours.  Hematology Recent Labs  Lab March 21, 2022 1049 Mar 21, 2022 1053 2022/03/21 1239 03/21/22 1252 Mar 21, 2022 1438  WBC 3.9*  --   --   --   --   RBC 4.18*  --   --   --   --   HGB 12.6*   < > 10.9* 11.2*  11.2* 11.2*  HCT 41.8   < > 32.0* 33.0*  33.0* 33.0*  MCV 100.0  --   --   --   --   MCH 30.1  --   --   --   --   MCHC 30.1  --   --   --   --   RDW 14.5  --   --   --   --   PLT 199  --   --   --   --    < > = values in this interval not displayed.   Thyroid No results for input(s): "TSH", "FREET4" in the last 168 hours.  BNP Recent Labs  Lab 03/21/2022 1049  BNP 1,863.7*    DDimer No results for input(s): "DDIMER" in the last 168 hours.   Radiology/Studies:  DG Chest 1 View  Result Date: 03-21-22 CLINICAL DATA:  Intubation EXAM: CHEST  1 VIEW COMPARISON:  Same day chest radiograph. FINDINGS: Interval intubation with the  endotracheal tube terminating below the thoracic inlet and above the carina. Bilateral costophrenic angles are excluded from the field of view. Cardiac and mediastinal contours are unchanged. Persistent bibasilar pulmonary opacities, left-greater-than-right. IMPRESSION: Interval intubation with the endotracheal tube terminating below the thoracic inlet and above the carina. Electronically Signed   By: Marin Roberts M.D.   On: 21-Mar-2022 11:38   DG Chest Port 1 View  Result Date: 03-21-2022 CLINICAL DATA:  Respiratory distress EXAM: PORTABLE CHEST 1 VIEW COMPARISON:  Chest radiograph 01/24/2022 FINDINGS: The heart size is grossly stable. The upper mediastinal contours are normal. There is a moderate-sized pleural effusion with dense retrocardiac opacity, worsened since 01/24/2022. Additional perihilar opacity may reflect layering fluid or superimposed edema. There is no focal airspace disease on the right. There is no significant right effusion. There is no appreciable pneumothorax There is no acute osseous abnormality. IMPRESSION: Moderate-sized left pleural effusion with dense retrocardiac opacity worsened since 01/24/2022. Additional perihilar opacity may reflect layering fluid or superimposed edema. Electronically Signed   By: Valetta Mole M.D.   On: 03/21/2022 10:40     Assessment and Plan:   Interior STEMI  -Wayne Meadows presented to the [facility] ED on March 21, 2022.  When he called EMS, chief complaint was shortness of breath.  EKG in the ED showed ST elevation in leads II, III, aVF and slight ST depression in V1-V4 - Code STEMI was activated, Wayne Meadows became bradycardic and hypotensive - Wayne Meadows was taken emergently to cath lab  - Critical care consulted for ventilator management and to assist management of cardiogenic shock    Risk Assessment/Risk  Scores:   TIMI Risk Score for ST  Elevation MI:   The Wayne Meadows's TIMI risk score is 9, which indicates a 35.9% risk of all cause mortality at 30 days.{      CHA2DS2-VASc Score = 5   This indicates a 7.2% annual risk of stroke. The Wayne Meadows's score is based upon: CHF History: 0 HTN History: 0 Diabetes History: 0 Stroke History: 2 Vascular Disease History: 1 Age Score: 2 Gender Score: 0     For questions or updates, please contact [place] Please consult [email or web address] for contact info under    Signed, Margie Billet, PA-C  Apr 13, 2022 3:06 PM    Wayne Meadows seen and examined. Agree with assessment and plan.  Wayne Meadows is an 88 year old gentleman with end-stage renal disease on dialysis, history of CAD status post prior stenting to his ramus and his RCA, COPD, recent pneumonia, who is a resident at [facility].  Today he became increasing short of breath need to [facility] ER evaluation.  In the ER he initially was tachycardic heart rate 140 with suggestion of ST elevation inferiorly.  Had poor aeration to his lungs and documented pleural effusion.  He had recently been hospitalized in December in the ER, he was visibly short of breath.  Heart rate decreased and blood pressure dropped he was started on pressure support.  Subsequent ECG showed more pronounced ST elevation inferiorly.  At that time, I contacted the Wayne Meadows's daughter informed her of his inferior myocardial infarction and need for urgent catheterization.  Due to his progressive shortness of breath it was felt that he should be intubated in the emergency room prior to coming to the catheterization laboratory.  He also was started on IV epinephrine in addition to Levophed.  I reviewed the severity with his daughter.  Plan emergent cardiac catheterization . Critical care time 35 minutes in ER   Troy Sine, MD, [facility] April 13, 2022 5:22 PM

## 2022-03-26 NOTE — Progress Notes (Signed)
Versed 2 mg and fentanyl 50 mcg pulled in ED for IV push prior to transfer to cath lab for code STEMI. Medications were used and charted in procedure log - no additional pull from pyxis for initial doses given in cath lab.  Antonietta Jewel, PharmD, [facility] Clinical Pharmacist  Phone: [phone] 03/29/2022 12:03 PM  Please check AMION for all [place] phone numbers After 10:00 PM, call [place] [phone]

## 2022-03-26 NOTE — Discharge Planning (Signed)
  Transition of Care [facility]) Screening Note   Patient Details  Name: Wayne Meadows Date of Birth: [birth year]   Transition of Care [facility]) CM/SW Contact:    Fuller Mandril, RN Phone Number: April 15, 2022, 11:37 AM    Transition of Care Department [facility]) has reviewed patient and no TOC needs have been identified at this time. We will continue to monitor patient advancement through interdisciplinary progression rounds. If new patient transition needs arise, please place a TOC consult.

## 2022-03-26 NOTE — Consult Note (Signed)
NAME:  Wayne Meadows, MRN:  [id number], DOB:  1934-06-28, LOS: 0 ADMISSION DATE:  03/20/22, CONSULTATION DATE: 03/20/2022 REFERRING MD: Cardiology, CHIEF COMPLAINT: Shock cardiogenic in nature ventilator dependent respiratory failure, end-stage renal disease  History of Present Illness:  88 year old male with a plethora of health issues as documented below who presented to [facility] emergency department via EMS on March 20, 2022 complaining of shortness of breath.  He is found to have an acute MI he was hypotensive bradycardic started on nor epi and epinephrine drips and transported to the Cath Lab for urgent interventions.  He is intubated his first pH was 6.91 he was given bicarb he does have end-stage renal disease and may need a bicarbonate drip.  Pulmonary critical care was called to the bedside while he was in the catheterization lab and was asked to manage the ventilator and assist with the cardiogenic shock.  Postcardiac c catheterization in the intensive care unit at that time including an arterial line, central venous access, osteomyelitis and bicarbonate drip.  Will check serial ABGs to monitor his lactic acidosis mucositis and guide [place] in ventilator management.  Pertinent  Medical History   Past Medical History:  Diagnosis Date   Abnormal echocardiogram 03/08/2021   Abnormal finding on GI tract imaging    Acute blood loss anemia    Acute pancreatitis 08/12/2020   Arteriovenous fistula infection ([place]) 04/18/2021   Arthritis    Carotid stenosis    Community acquired pneumonia 04/06/2021   COPD (chronic obstructive pulmonary disease) (HCC)    Coronary artery disease    S/p PCI 2011;  NSTEMI 12/12:  LHC/PCI 02/23/11: LAD 60% after the septal perforator, D1 occluded with distal collaterals, proximal RI 30-40%, AV circumflex stent patent with 60% stenosis after the stent, RCA 99%, EF 60-65%.  His RCA was treated with a bare-metal stent   CVA (cerebral infarction) 2011   Right  cerebral; total obstruction of the right ICA   Diverticulitis    Hypertension    Paroxysmal atrial fibrillation with RVR ([place]) 04/09/2021   Partial small bowel obstruction ([place]) 08/17/2021   Pleuritic chest pain 04/08/2014   Pressure injury of skin 03/30/2021   Rash 03/05/2021   Rectal bleeding 07/2015   Refractory nausea and vomiting 06/20/2020   Renal carcinoma ([place])    Sepsis, unspecified organism ([place]) 04/11/2016   Stroke ([place])    Tobacco abuse, in remission    Unstable angina ([place]) 03/18/2011     Significant Hospital Events: Including procedures, antibiotic start and stop dates in addition to other pertinent events   03-20-2022 cardiac catheterization  Interim History / Subjective:  Presented to the emergency room with acute ST elevation MI and taken to Cath Lab  Objective   Blood pressure (!) 143/64, pulse (!) 145, resp. rate (!) 27, height 6' (1.829 m), weight 72.6 kg, SpO2 94 %.    Vent Mode: PRVC FiO2 (%):  [100 %] 100 % Set Rate:  [16 bmp] 16 bmp Vt Set:  [450 mL] 450 mL PEEP:  [5 cmH20] 5 cmH20 Plateau Pressure:  [11 cmH20] 11 cmH20  No intake or output data in the 24 hours ending 03/20/2022 1205 Filed Weights   Mar 20, 2022 1100  Weight: 72.6 kg    Examination: General: Thin male is currently on the Cath Lab table intubated HENT: Endotracheal tube is in place Lungs: Creased breath sounds throughout Cardiovascular: Tachycardic to 130s 140s on epinephrine and norepinephrine Abdomen: Soft nontender Extremities: Warm and dry Neuro: Sedated paralyzed and  intubated   Resolved Hospital Problem list     Assessment & Plan:  Ventilator dependent respiratory failure in the setting of acute MI that required intubation. Underlying O2 dependent bronchodilator dependent COPD Suspected left lower lobe pneumonia in a skilled nursing facility patient Admit to the intensive care unit Ventilator adjustments dependent on arterial blood gas Bronchodilators Sputum culture Empirical  antimicrobial therapy with vancomycin and cefepime Note 03/19/2024 12:20 PM first pH of 6.91 pCO2 is elevated 2 A of bicarb-given along with increased respiratory rate on the ventilator from 16-24 he will need a repeat ABG  Acute MI with a history of right coronary artery bare-metal stent with possible reocclusion.  cardiac shock cath lab results not completed at the time of this dictation.  Vasopressor support as needed Per cardiology Anticoagulation Vasopressor support Check lactic acid to rule out lactic acidosis Place arterial line when stabilized He will need venous access once stabilized  End-stage renal disease reportedly gets dialysis Monday Wednesdays and Fridays per chart but he reported to EMS he gets it Tuesday Thursdays and Saturdays.  Last dialysis date is unknown at the time of this dictation.  History of renal cell carcinoma Nephrology consult Dialysis as needed  History of CVA Neurological checks in the ICU   Best Practice (right click and "Reselect all SmartList Selections" daily)   Diet/type: NPO DVT prophylaxis: systemic heparin GI prophylaxis: PPI Lines: N/A Foley:  N/A Code Status:  full code Last date of multidisciplinary goals of care discussion [tbd]  Labs   CBC: Recent Labs  Lab April 07, 2022 1049 2022-04-07 1053 04/07/2022 1055  WBC 3.9*  --   --   NEUTROABS 3.5  --   --   HGB 12.6* 13.6 14.6  HCT 41.8 40.0 43.0  MCV 100.0  --   --   PLT 199  --   --     Basic Metabolic Panel: Recent Labs  Lab 04-07-22 1053 04-07-2022 1055  NA 135 134*  K 5.3* 5.3*  CL  --  100  GLUCOSE  --  106*  BUN  --  26*  CREATININE  --  4.80*   GFR: Estimated Creatinine Clearance: 11.6 mL/min (A) (by C-G formula based on SCr of 4.8 mg/dL (H)). Recent Labs  Lab Apr 07, 2022 1049  WBC 3.9*    Liver Function Tests: No results for input(s): "AST", "ALT", "ALKPHOS", "BILITOT", "PROT", "ALBUMIN" in the last 168 hours. No results for input(s): "LIPASE", "AMYLASE" in the  last 168 hours. No results for input(s): "AMMONIA" in the last 168 hours.  ABG    Component Value Date/Time   PHART 7.39 01/21/2022 2014   PCO2ART 49 (H) 01/21/2022 2014   PO2ART 113 (H) 01/21/2022 2014   HCO3 25.2 2022/04/07 1053   TCO2 24 2022-04-07 1055   ACIDBASEDEF 5.0 (H) 01/20/2022 1617   O2SAT 83 07-Apr-2022 1053     Coagulation Profile: Recent Labs  Lab 04/07/2022 1049  INR 1.3*    Cardiac Enzymes: No results for input(s): "CKTOTAL", "CKMB", "CKMBINDEX", "TROPONINI" in the last 168 hours.  HbA1C: Hgb A1c MFr Bld  Date/Time Value Ref Range Status  01/25/2022 01:47 AM 5.3 4.8 - 5.6 % Final    Comment:    (NOTE)         Prediabetes: 5.7 - 6.4         Diabetes: >6.4         Glycemic control for adults with diabetes: <7.0   07/09/2020 05:00 AM 7.3 (H) 4.8 - 5.6 %  Final    Comment:    (NOTE) Pre diabetes:          5.7%-6.4%  Diabetes:              >6.4%  Glycemic control for   <7.0% adults with diabetes     CBG: No results for input(s): "GLUCAP" in the last 168 hours.  Review of Systems:   na  Past Medical History:  He,  has a past medical history of Abnormal echocardiogram (03/08/2021), Abnormal finding on GI tract imaging, Acute blood loss anemia, Acute pancreatitis (08/12/2020), Arteriovenous fistula infection ([place]) (04/18/2021), Arthritis, Carotid stenosis, Community acquired pneumonia (04/06/2021), COPD (chronic obstructive pulmonary disease) ([place]), Coronary artery disease, CVA (cerebral infarction) (2011), Diverticulitis, Hypertension, Paroxysmal atrial fibrillation with RVR ([place]) (04/09/2021), Partial small bowel obstruction ([place]) (08/17/2021), Pleuritic chest pain (04/08/2014), Pressure injury of skin (03/30/2021), Rash (03/05/2021), Rectal bleeding (07/2015), Refractory nausea and vomiting (06/20/2020), Renal carcinoma ([place]), Sepsis, unspecified organism ([place]) (04/11/2016), Stroke [facility]), Tobacco abuse, in remission, and Unstable angina ([place]) (03/18/2011).    Surgical History:   Past Surgical History:  Procedure Laterality Date   AV FISTULA PLACEMENT Right 04/03/2021   Procedure: ARTERIOVENOUS (AV) CREATION OF RIGHT ARM BRACHIOCEPHALIC FISTULA;  Surgeon: Cherre Robins, MD;  Location: [facility] OR;  Service: Vascular;  Laterality: Right;   BIOPSY  04/08/2021   Procedure: BIOPSY;  Surgeon: Lavena Bullion, DO;  Location: [place];  Service: Gastroenterology;;   BIOPSY  11/06/2021   Procedure: BIOPSY;  Surgeon: Lavena Bullion, DO;  Location: [place];  Service: Gastroenterology;;   COLON SURGERY     COLONOSCOPY N/A 08/22/2015   Procedure: COLONOSCOPY;  Surgeon: [name], MD;  Location: [facility] ENDOSCOPY;  Service: Endoscopy;  Laterality: N/A;   ESOPHAGOGASTRODUODENOSCOPY N/A 07/18/2020   Procedure: ESOPHAGOGASTRODUODENOSCOPY (EGD);  Surgeon: Milus Banister, MD;  Location: [facility];  Service: Endoscopy;  Laterality: N/A;   ESOPHAGOGASTRODUODENOSCOPY (EGD) WITH PROPOFOL N/A 06/21/2020   Procedure: ESOPHAGOGASTRODUODENOSCOPY (EGD) WITH PROPOFOL;  Surgeon: Irene Shipper, MD;  Location: [facility] ENDOSCOPY;  Service: Endoscopy;  Laterality: N/A;   ESOPHAGOGASTRODUODENOSCOPY (EGD) WITH PROPOFOL N/A 04/08/2021   Procedure: ESOPHAGOGASTRODUODENOSCOPY (EGD) WITH PROPOFOL;  Surgeon: Lavena Bullion, DO;  Location: [place];  Service: Gastroenterology;  Laterality: N/A;   ESOPHAGOGASTRODUODENOSCOPY (EGD) WITH PROPOFOL N/A 11/06/2021   Procedure: ESOPHAGOGASTRODUODENOSCOPY (EGD) WITH PROPOFOL;  Surgeon: Lavena Bullion, DO;  Location: [place];  Service: Gastroenterology;  Laterality: N/A;   EUS N/A 07/18/2020   Procedure: UPPER ENDOSCOPIC ULTRASOUND (EUS) RADIAL;  Surgeon: Milus Banister, MD;  Location: [facility] ENDOSCOPY;  Service: Endoscopy;  Laterality: N/A;   FINE NEEDLE ASPIRATION N/A 07/18/2020   Procedure: FINE NEEDLE ASPIRATION (FNA) LINEAR;  Surgeon: Milus Banister, MD;  Location: [facility] ENDOSCOPY;  Service: Endoscopy;  Laterality: N/A;    HEMOSTASIS CLIP PLACEMENT  04/08/2021   Procedure: HEMOSTASIS CLIP PLACEMENT;  Surgeon: Lavena Bullion, DO;  Location: [place];  Service: Gastroenterology;;   hip relacement     INCISION AND DRAINAGE Right 09/11/2021   Procedure: INCISION AND DRAINAGE OF RIGHT ARM FISTULA;  Surgeon: Cherre Robins, MD;  Location: [facility] OR;  Service: Vascular;  Laterality: Right;   IR FLUORO GUIDE CV LINE RIGHT  03/31/2021   IR REMOVAL TUN CV CATH W/O FL  08/15/2021   IR US GUIDE VASC ACCESS RIGHT  03/31/2021   KIDNEY SURGERY     LEFT HEART CATH AND CORONARY ANGIOGRAPHY N/A 07/09/2020   Procedure: LEFT HEART CATH AND CORONARY ANGIOGRAPHY;  Surgeon: Leonie Man, MD;  Location: [place] CV LAB;  Service: Cardiovascular;  Laterality: N/A;   LEFT HEART CATHETERIZATION WITH CORONARY ANGIOGRAM N/A 02/23/2011   Procedure: LEFT HEART CATHETERIZATION WITH CORONARY ANGIOGRAM;  Surgeon: [name], MD;  Location: [facility] CATH LAB;  Service: Cardiovascular;  Laterality: N/A;   LEFT HEART CATHETERIZATION WITH CORONARY ANGIOGRAM N/A 03/18/2011   Procedure: LEFT HEART CATHETERIZATION WITH CORONARY ANGIOGRAM;  Surgeon: [name], MD;  Location[facility] CATH LAB;  Service: Cardiovascular;  Laterality: N/A;   PERCUTANEOUS CORONARY STENT INTERVENTION (PCI-S) N/A 02/23/2011   Procedure: PERCUTANEOUS CORONARY STENT INTERVENTION (PCI-S);  Surgeon: [name], MD;  Location: [facility] CATH LAB;  Service: Cardiovascular;  Laterality: N/A;   TEMPORARY PACEMAKER INSERTION N/A 02/23/2011   Procedure: TEMPORARY PACEMAKER INSERTION;  Surgeon: [name], MD;  Location: [facility] CATH LAB;  Service: Cardiovascular;  Laterality: N/A;   THROMBECTOMY W/ EMBOLECTOMY Right 04/03/2021   Procedure: THROMBECTOMY ARTERIOVENOUS FISTULA;  Surgeon: Cherre Robins, MD;  Location: [place];  Service: Vascular;  Laterality: Right;     Social History:   reports that he quit smoking about 15 years ago. His smoking use included cigarettes. He has never  used smokeless tobacco. He reports that he does not currently use alcohol after a past usage of about 1.0 standard drink of alcohol per week. He reports that he does not use drugs.   Family History:  His family history includes Heart attack (age of onset: 63) in an other family member.   Allergies Allergies  Allergen Reactions   Bee Venom Anaphylaxis   Influenza Vaccines Other (See Comments)    "Mortally sick for 2 weeks"     Home Medications  Prior to Admission medications   Medication Sig Start Date End Date Taking? Authorizing Provider  acetaminophen (TYLENOL) 325 MG tablet Take 2 tablets (650 mg total) by mouth every 6 (six) hours as needed for mild pain (or Fever >/= 101). Patient taking differently: Take 650 mg by mouth every 6 (six) hours as needed for mild pain (fever > 101F). 04/10/21   Bonnielee Haff, MD  Amino Acids-Protein Hydrolys (FEEDING SUPPLEMENT, PRO-STAT SUGAR FREE 64,) LIQD Take 30 mLs by mouth every evening.    [provider]  antiseptic oral rinse (BIOTENE) LIQD 10 mLs by Mouth Rinse route 2 (two) times daily.    [provider]  benzonatate (TESSALON) 100 MG capsule Take 1 capsule (100 mg total) by mouth 3 (three) times daily. 10/30/21   Lavina Hamman, MD  busPIRone (BUSPAR) 5 MG tablet Take 1 tablet (5 mg total) by mouth 3 (three) times daily. (0800 & 2000) Patient taking differently: Take 5 mg by mouth 3 (three) times daily. 10/30/21   Lavina Hamman, MD  Darbepoetin Alfa (ARANESP) 60 MCG/0.3ML SOSY injection Inject 0.3 mLs (60 mcg total) into the vein every Monday with hemodialysis. Patient not taking: Reported on 01/20/2022 09/30/21   Cherene Altes, MD  dextromethorphan-guaiFENesin [facility]) 30-600 MG 12hr tablet Take 1 tablet by mouth 2 (two) times daily. 10/30/21   Lavina Hamman, MD  diltiazem (CARDIZEM) 60 MG tablet Take 1 tablet (60 mg total) by mouth 3 (three) times daily. 01/27/22   Thurnell Lose, MD  HYDROcodone-acetaminophen  (NORCO) 10-325 MG tablet Take 1 tablet by mouth every 6 (six) hours as needed for severe pain or moderate pain. 01/27/22   Thurnell Lose, MD  hydrOXYzine (ATARAX) 10 MG tablet Take 10 mg by mouth every  6 (six) hours as needed for itching.    [provider]  Ipratropium-Albuterol (COMBIVENT RESPIMAT) 20-100 MCG/ACT AERS respimat Inhale 1 puff into the lungs 4 (four) times daily.    [provider]  ipratropium-albuterol (DUONEB) 0.5-2.5 (3) MG/3ML SOLN Take 3 mLs by nebulization every 4 (four) hours as needed (shortness of breath, wheezing).    [provider]  levothyroxine (SYNTHROID) 75 MCG tablet Take 75 mcg by mouth in the morning. (0630)    [provider]  melatonin 5 MG TABS Take 5 mg by mouth at bedtime.    [provider]  Menthol, Topical Analgesic, (BIOFREEZE) 4 % GEL Apply 1 application  topically 3 (three) times daily as needed (topical analgesic).    [provider]  metoprolol tartrate (LOPRESSOR) 25 MG tablet Take 0.5 tablets (12.5 mg total) by mouth 2 (two) times daily. 10/30/21   Lavina Hamman, MD  Naphazoline-Glycerin (CLEAR EYES REDNESS RELIEF OP) Place 1-2 drops into the right eye 3 (three) times daily.    [provider]  Nutritional Supplements (NOVASOURCE RENAL) LIQD Take 237 mLs by mouth 2 (two) times daily.    [provider]  ondansetron (ZOFRAN) 4 MG tablet Take 4 mg by mouth every 6 (six) hours as needed for nausea or vomiting.    [provider]  pantoprazole (PROTONIX) 40 MG tablet Take 40 mg by mouth in the morning.    [provider]  polyethylene glycol (MIRALAX / GLYCOLAX) 17 g packet Take 17 g by mouth daily. Patient taking differently: Take 17 g by mouth in the morning. 05/01/21   Sherwood Gambler, MD  pravastatin (PRAVACHOL) 40 MG tablet Take 40 mg by mouth at bedtime.    [provider]  senna (SENOKOT) 8.6 MG TABS tablet Take 8.6 mg by mouth 2 (two) times daily.     [provider]  sevelamer carbonate (RENVELA) 800 MG tablet Take 1,600 mg by mouth with breakfast, with lunch, and with evening meal.    [provider]  sodium chloride (OCEAN) 0.65 % SOLN nasal spray Place 2 sprays into both nostrils 2 (two) times daily.    [provider]  tamsulosin (FLOMAX) 0.4 MG CAPS capsule Take 1 capsule (0.4 mg total) by mouth daily. Patient taking differently: Take 0.4 mg by mouth in the morning. 04/11/21   Bonnielee Haff, MD     Critical care time: 48 min    Richardson Landry Gar Glance ACNP Acute Care Nurse Practitioner [place] Please consult Amion 2022-04-15, 12:05 PM

## 2022-03-26 NOTE — ED Provider Notes (Signed)
[facility] CATH LAB Provider Note  CSN: [id number] Arrival date & time: 04-07-22 0944  Chief Complaint(s) No chief complaint on file.  HPI Wayne Meadows is a 88 y.o. male history of COPD, coronary artery disease, prior stroke, end-stage renal disease on Tuesday, Thursday, Friday dialysis, presenting to the emergency department shortness of breath.  Patient reports shortness of breath began yesterday.  He also reports productive cough.  He reports fevers and chills.  He denies any chest pain.  Denies any leg swelling.  Reports he has been compliant with his dialysis.  No abdominal pain, back pain.  He also reports generalized weakness.  EMS gave patient inhaler.  Patient had a temperature of 100.6 with EMS.   Past Medical History Past Medical History:  Diagnosis Date   Abnormal echocardiogram 03/08/2021   Abnormal finding on GI tract imaging    Acute blood loss anemia    Acute pancreatitis 08/12/2020   Arteriovenous fistula infection ([place]) 04/18/2021   Arthritis    Carotid stenosis    Community acquired pneumonia 04/06/2021   COPD (chronic obstructive pulmonary disease) ([place])    Coronary artery disease    S/p PCI 2011;  NSTEMI 12/12:  LHC/PCI 02/23/11: LAD 60% after the septal perforator, D1 occluded with distal collaterals, proximal RI 30-40%, AV circumflex stent patent with 60% stenosis after the stent, RCA 99%, EF 60-65%.  His RCA was treated with a bare-metal stent   CVA (cerebral infarction) 2011   Right cerebral; total obstruction of the right ICA   Diverticulitis    Hypertension    Paroxysmal atrial fibrillation with RVR ([place]) 04/09/2021   Partial small bowel obstruction ([place]) 08/17/2021   Pleuritic chest pain 04/08/2014   Pressure injury of skin 03/30/2021   Rash 03/05/2021   Rectal bleeding 07/2015   Refractory nausea and vomiting 06/20/2020   Renal carcinoma ([place])    Sepsis, unspecified organism ([place]) 04/11/2016   Stroke ([place])    Tobacco abuse, in  remission    Unstable angina ([place]) 03/18/2011   Patient Active Problem List   Diagnosis Date Noted   Acute MI ([place]) April 07, 2022   Acute respiratory failure with hypoxia ([place]) 01/20/2022   Hematemesis with nausea    Erosive esophagitis    Hiatal hernia    UGI bleed 11/05/2021   Lives in nursing home - [name] 11/05/2021   COVID-19 virus infection 11/05/2021   Thoracic compression fracture, sequela 10/30/2021   Multiple fractures of ribs, bilateral, initial encounter for closed fracture 10/30/2021   Back pain    Chronic respiratory failure with hypoxia (HCC) - 2.5 L/min continuously 09/28/2021   Paroxysmal atrial flutter ([place]) 09/28/2021   ESRD (end stage renal disease) ([place]) 09/11/2021   Gastric ulcer without hemorrhage or perforation    Duodenal ulcer    ESRD (end stage renal disease) on MWF dialysis ([place]) 04/06/2021   Proteinuria 03/07/2021   Acquired hypothyroidism 03/05/2021   Anxiety 03/05/2021   Chronic pancreatitis ([place]) 12/24/2020   Esophageal stricture    Benign neoplasm of transverse colon    Benign neoplasm of colon    Dysphagia    Renal cell cancer ([place])    Essential hypertension    Centrilobular emphysema ([place])    Cholelithiasis 04/26/2011   Paroxysmal atrial fibrillation ([place]) - not on systemic anticoagulation due to fall risk 04/26/2011   Lung nodule 03/02/2011   Liver lesion 03/02/2011   Other hyperlipidemia 03/02/2011   Abnormal CT of the abdomen 02/23/2011   Non Q  wave myocardial infarction ([place]) 02/22/2011    Class: Acute   History of CVA (cerebrovascular accident) 02/22/2011   CAD (coronary artery disease) 02/22/2011   COPD exacerbation ([place]) 02/22/2011   Tobacco abuse, 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/22/2011   Home Medication(s) Prior to Admission medications   Medication Sig Start Date End Date Taking? Authorizing Provider  acetaminophen (TYLENOL) 325 MG tablet Take 2 tablets (650 mg total) by mouth every 6 (six) hours as needed for mild pain (or Fever >/=  101). Patient taking differently: Take 650 mg by mouth every 6 (six) hours as needed for mild pain (fever > 101F). 04/10/21   Bonnielee Haff, MD  Amino Acids-Protein Hydrolys (FEEDING SUPPLEMENT, PRO-STAT SUGAR FREE 64,) LIQD Take 30 mLs by mouth every evening.    [provider]  antiseptic oral rinse (BIOTENE) LIQD 10 mLs by Mouth Rinse route 2 (two) times daily.    [provider]  benzonatate (TESSALON) 100 MG capsule Take 1 capsule (100 mg total) by mouth 3 (three) times daily. 10/30/21   Lavina Hamman, MD  busPIRone (BUSPAR) 5 MG tablet Take 1 tablet (5 mg total) by mouth 3 (three) times daily. (0800 & 2000) Patient taking differently: Take 5 mg by mouth 3 (three) times daily. 10/30/21   Lavina Hamman, MD  Darbepoetin Alfa (ARANESP) 60 MCG/0.3ML SOSY injection Inject 0.3 mLs (60 mcg total) into the vein every Monday with hemodialysis. Patient not taking: Reported on 01/20/2022 09/30/21   Cherene Altes, MD  dextromethorphan-guaiFENesin [facility] DM) 30-600 MG 12hr tablet Take 1 tablet by mouth 2 (two) times daily. 10/30/21   Lavina Hamman, MD  diltiazem (CARDIZEM) 60 MG tablet Take 1 tablet (60 mg total) by mouth 3 (three) times daily. 01/27/22   Thurnell Lose, MD  HYDROcodone-acetaminophen (NORCO) 10-325 MG tablet Take 1 tablet by mouth every 6 (six) hours as needed for severe pain or moderate pain. 01/27/22   Thurnell Lose, MD  hydrOXYzine (ATARAX) 10 MG tablet Take 10 mg by mouth every 6 (six) hours as needed for itching.    [provider]  Ipratropium-Albuterol (COMBIVENT RESPIMAT) 20-100 MCG/ACT AERS respimat Inhale 1 puff into the lungs 4 (four) times daily.    [provider]  ipratropium-albuterol (DUONEB) 0.5-2.5 (3) MG/3ML SOLN Take 3 mLs by nebulization every 4 (four) hours as needed (shortness of breath, wheezing).    [provider]  levothyroxine (SYNTHROID) 75 MCG tablet Take 75 mcg by mouth in the morning. (0630)    [provider]  melatonin 5 MG TABS Take 5 mg by mouth at bedtime.    [provider]  Menthol, Topical Analgesic, (BIOFREEZE) 4 % GEL Apply 1 application  topically 3 (three) times daily as needed (topical analgesic).    [provider]  metoprolol tartrate (LOPRESSOR) 25 MG tablet Take 0.5 tablets (12.5 mg total) by mouth 2 (two) times daily. 10/30/21   Lavina Hamman, MD  Naphazoline-Glycerin (CLEAR EYES REDNESS RELIEF OP) Place 1-2 drops into the right eye 3 (three) times daily.    [provider]  Nutritional Supplements (NOVASOURCE RENAL) LIQD Take 237 mLs by mouth 2 (two) times daily.    [provider]  ondansetron (ZOFRAN) 4 MG tablet Take 4 mg by mouth every 6 (six) hours as needed for nausea or vomiting.    [provider]  pantoprazole (PROTONIX) 40 MG tablet Take 40 mg by mouth in the morning.    [provider]  polyethylene glycol (  MIRALAX / GLYCOLAX) 17 g packet Take 17 g by mouth daily. Patient taking differently: Take 17 g by mouth in the morning. 05/01/21   Sherwood Gambler, MD  pravastatin (PRAVACHOL) 40 MG tablet Take 40 mg by mouth at bedtime.    [provider]  senna (SENOKOT) 8.6 MG TABS tablet Take 8.6 mg by mouth 2 (two) times daily.    [provider]  sevelamer carbonate (RENVELA) 800 MG tablet Take 1,600 mg by mouth with breakfast, with lunch, and with evening meal.    [provider]  sodium chloride (OCEAN) 0.65 % SOLN nasal spray Place 2 sprays into both nostrils 2 (two) times daily.    [provider]  tamsulosin (FLOMAX) 0.4 MG CAPS capsule Take 1 capsule (0.4 mg total) by mouth daily. Patient taking differently: Take 0.4 mg by mouth in the morning. 04/11/21   Bonnielee Haff, MD                                                                                                                                    Past Surgical History Past Surgical History:  Procedure Laterality Date    AV FISTULA PLACEMENT Right 04/03/2021   Procedure: ARTERIOVENOUS (AV) CREATION OF RIGHT ARM BRACHIOCEPHALIC FISTULA;  Surgeon: Cherre Robins, MD;  Location: [facility] OR;  Service: Vascular;  Laterality: Right;   BIOPSY  04/08/2021   Procedure: BIOPSY;  Surgeon: Lavena Bullion, DO;  Location: [facility];  Service: Gastroenterology;;   BIOPSY  11/06/2021   Procedure: BIOPSY;  Surgeon: Lavena Bullion, DO;  Location: [facility];  Service: Gastroenterology;;   COLON SURGERY     COLONOSCOPY N/A 08/22/2015   Procedure: COLONOSCOPY;  Surgeon: [name], MD;  Location: [facility] ENDOSCOPY;  Service: Endoscopy;  Laterality: N/A;   ESOPHAGOGASTRODUODENOSCOPY N/A 07/18/2020   Procedure: ESOPHAGOGASTRODUODENOSCOPY (EGD);  Surgeon: Milus Banister, MD;  Location: [facility];  Service: Endoscopy;  Laterality: N/A;   ESOPHAGOGASTRODUODENOSCOPY (EGD) WITH PROPOFOL N/A 06/21/2020   Procedure: ESOPHAGOGASTRODUODENOSCOPY (EGD) WITH PROPOFOL;  Surgeon: Irene Shipper, MD;  Location: [facility] ENDOSCOPY;  Service: Endoscopy;  Laterality: N/A;   ESOPHAGOGASTRODUODENOSCOPY (EGD) WITH PROPOFOL N/A 04/08/2021   Procedure: ESOPHAGOGASTRODUODENOSCOPY (EGD) WITH PROPOFOL;  Surgeon: Lavena Bullion, DO;  Location: [facility];  Service: Gastroenterology;  Laterality: N/A;   ESOPHAGOGASTRODUODENOSCOPY (EGD) WITH PROPOFOL N/A 11/06/2021   Procedure: ESOPHAGOGASTRODUODENOSCOPY (EGD) WITH PROPOFOL;  Surgeon: Lavena Bullion, DO;  Location: [facility];  Service: Gastroenterology;  Laterality: N/A;   EUS N/A 07/18/2020   Procedure: UPPER ENDOSCOPIC ULTRASOUND (EUS) RADIAL;  Surgeon: Milus Banister, MD;  Location: [facility] ENDOSCOPY;  Service: Endoscopy;  Laterality: N/A;   FINE NEEDLE ASPIRATION N/A 07/18/2020   Procedure: FINE NEEDLE ASPIRATION (FNA) LINEAR;  Surgeon: Milus Banister, MD;  Location: [facility] ENDOSCOPY;  Service: Endoscopy;  Laterality: N/A;   HEMOSTASIS CLIP PLACEMENT  04/08/2021   Procedure: HEMOSTASIS CLIP  PLACEMENT;  Surgeon:  Cirigliano, Dominic Pea, DO;  Location: [facility] ENDOSCOPY;  Service: Gastroenterology;;   hip relacement     INCISION AND DRAINAGE Right 09/11/2021   Procedure: INCISION AND DRAINAGE OF RIGHT ARM FISTULA;  Surgeon: Cherre Robins, MD;  Location: [facility] OR;  Service: Vascular;  Laterality: Right;   IR FLUORO GUIDE CV LINE RIGHT  03/31/2021   IR REMOVAL TUN CV CATH W/O FL  08/15/2021   IR US GUIDE VASC ACCESS RIGHT  03/31/2021   KIDNEY SURGERY     LEFT HEART CATH AND CORONARY ANGIOGRAPHY N/A 07/09/2020   Procedure: LEFT HEART CATH AND CORONARY ANGIOGRAPHY;  Surgeon: Leonie Man, MD;  Location: [facility] CV LAB;  Service: Cardiovascular;  Laterality: N/A;   LEFT HEART CATHETERIZATION WITH CORONARY ANGIOGRAM N/A 02/23/2011   Procedure: LEFT HEART CATHETERIZATION WITH CORONARY ANGIOGRAM;  Surgeon: [name], MD;  Location: [facility] CATH LAB;  Service: Cardiovascular;  Laterality: N/A;   LEFT HEART CATHETERIZATION WITH CORONARY ANGIOGRAM N/A 03/18/2011   Procedure: LEFT HEART CATHETERIZATION WITH CORONARY ANGIOGRAM;  Surgeon: [name], MD;  Location: [facility] CATH LAB;  Service: Cardiovascular;  Laterality: N/A;   PERCUTANEOUS CORONARY STENT INTERVENTION (PCI-S) N/A 02/23/2011   Procedure: PERCUTANEOUS CORONARY STENT INTERVENTION (PCI-S);  Surgeon: [name], MD;  Location[facility] CATH LAB;  Service: Cardiovascular;  Laterality: N/A;   TEMPORARY PACEMAKER INSERTION N/A 02/23/2011   Procedure: TEMPORARY PACEMAKER INSERTION;  Surgeon: [name], MD;  Location: [facility] CATH LAB;  Service: Cardiovascular;  Laterality: N/A;   THROMBECTOMY W/ EMBOLECTOMY Right 04/03/2021   Procedure: THROMBECTOMY ARTERIOVENOUS FISTULA;  Surgeon: Cherre Robins, MD;  Location: [facility] OR;  Service: Vascular;  Laterality: Right;   Family History Family History  Problem Relation Age of Onset   Heart attack Other 29    Social History Social History   Tobacco Use   Smoking status: Former    Types: Cigarettes     Quit date: 02/24/2007    Years since quitting: 15.0   Smokeless tobacco: Never  Vaping Use   Vaping Use: Never used  Substance Use Topics   Alcohol use: Not Currently    Alcohol/week: 1.0 standard drink of alcohol    Types: 1 Standard drinks or equivalent per week    Comment: socially    Drug use: No   Allergies Bee venom and Influenza vaccines  Review of Systems Review of Systems  All other systems reviewed and are negative.   Physical Exam Vital Signs  I have reviewed the triage vital signs BP (!) 143/64   Pulse (!) 145   Resp (!) 27   Ht 6' (1.829 m)   Wt 72.6 kg   SpO2 94%   BMI 21.70 kg/m  Physical Exam Vitals and nursing note reviewed.  Constitutional:      General: He is in acute distress.     Appearance: He is ill-appearing.  HENT:     Mouth/Throat:     Mouth: Mucous membranes are dry.  Eyes:     Conjunctiva/sclera: Conjunctivae normal.  Cardiovascular:     Rate and Rhythm: Regular rhythm. Tachycardia present.  Pulmonary:     Comments: Tachypnea with left basilar crackles, no wheezing Abdominal:     General: Abdomen is flat.     Palpations: Abdomen is soft.     Tenderness: There is no abdominal tenderness.  Musculoskeletal:     Right lower leg: No edema.     Left lower leg: No edema.  Skin:  General: Skin is warm and dry.     Capillary Refill: Capillary refill takes less than 2 seconds.  Neurological:     Mental Status: He is alert and oriented to person, place, and time. Mental status is at baseline.  Psychiatric:        Mood and Affect: Mood normal.        Behavior: Behavior normal.     ED Results and Treatments Labs (all labs ordered are listed, but only abnormal results are displayed) Labs Reviewed  COMPREHENSIVE METABOLIC PANEL - Abnormal; Notable for the following components:      Result Value   Sodium 134 (*)    Potassium 5.4 (*)    Glucose, Bld 109 (*)    Creatinine, Ser 4.49 (*)    Calcium 8.4 (*)    Total Protein 6.1 (*)     Albumin 2.9 (*)    Total Bilirubin 1.4 (*)    GFR, Estimated 12 (*)    All other components within normal limits  BRAIN NATRIURETIC PEPTIDE - Abnormal; Notable for the following components:   B Natriuretic Peptide 1,863.7 (*)    All other components within normal limits  CBC WITH DIFFERENTIAL/PLATELET - Abnormal; Notable for the following components:   WBC 3.9 (*)    RBC 4.18 (*)    Hemoglobin 12.6 (*)    Lymphs Abs 0.3 (*)    All other components within normal limits  PROTIME-INR - Abnormal; Notable for the following components:   Prothrombin Time 16.2 (*)    INR 1.3 (*)    All other components within normal limits  LACTIC ACID, PLASMA - Abnormal; Notable for the following components:   Lactic Acid, Venous 2.7 (*)    All other components within normal limits  I-STAT CHEM 8, ED - Abnormal; Notable for the following components:   Sodium 134 (*)    Potassium 5.3 (*)    BUN 26 (*)    Creatinine, Ser 4.80 (*)    Glucose, Bld 106 (*)    Calcium, Ion 0.94 (*)    All other components within normal limits  I-STAT VENOUS BLOOD GAS, ED - Abnormal; Notable for the following components:   pH, Ven 7.518 (*)    pCO2, Ven 31.0 (*)    Acid-Base Excess 3.0 (*)    Potassium 5.3 (*)    Calcium, Ion 0.98 (*)    All other components within normal limits  TROPONIN I (HIGH SENSITIVITY) - Abnormal; Notable for the following components:   Troponin I (High Sensitivity) 1,694 (*)    All other components within normal limits  CULTURE, BLOOD (ROUTINE X 2)  CULTURE, BLOOD (ROUTINE X 2)  RESP PANEL BY RT-PCR (RSV, FLU A&B, COVID)  RVPGX2  CULTURE, RESPIRATORY W GRAM STAIN  LACTIC ACID, PLASMA  LACTIC ACID, PLASMA  LACTIC ACID, PLASMA  PROCALCITONIN  TROPONIN I (HIGH SENSITIVITY)  Radiology DG Chest 1 View  Result Date: 04/02/22 CLINICAL DATA:  Intubation EXAM: CHEST  1 VIEW  COMPARISON:  Same day chest radiograph. FINDINGS: Interval intubation with the endotracheal tube terminating below the thoracic inlet and above the carina. Bilateral costophrenic angles are excluded from the field of view. Cardiac and mediastinal contours are unchanged. Persistent bibasilar pulmonary opacities, left-greater-than-right. IMPRESSION: Interval intubation with the endotracheal tube terminating below the thoracic inlet and above the carina. Electronically Signed   By: Marin Roberts M.D.   On: 04-02-2022 11:38   DG Chest Port 1 View  Result Date: Apr 02, 2022 CLINICAL DATA:  Respiratory distress EXAM: PORTABLE CHEST 1 VIEW COMPARISON:  Chest radiograph 01/24/2022 FINDINGS: The heart size is grossly stable. The upper mediastinal contours are normal. There is a moderate-sized pleural effusion with dense retrocardiac opacity, worsened since 01/24/2022. Additional perihilar opacity may reflect layering fluid or superimposed edema. There is no focal airspace disease on the right. There is no significant right effusion. There is no appreciable pneumothorax There is no acute osseous abnormality. IMPRESSION: Moderate-sized left pleural effusion with dense retrocardiac opacity worsened since 01/24/2022. Additional perihilar opacity may reflect layering fluid or superimposed edema. Electronically Signed   By: Valetta Mole M.D.   On: 04/02/2022 10:40    Pertinent labs & imaging results that were available during my care of the patient were reviewed by me and considered in my medical decision making (see MDM for details).  Medications Ordered in ED Medications  heparin 5000 UNIT/ML injection (has no administration in time range)  metoprolol tartrate (LOPRESSOR) injection 2.5 mg ( Intravenous MAR Hold 2022/04/02 1146)  PHENYLephrine 80 mcg/ml in normal saline Adult IV Push Syringe (For Blood Pressure Support) ( Intravenous MAR Hold 04-02-22 1146)  0.9 %  sodium chloride infusion (has no administration in time  range)  midazolam (VERSED) 100 mg/100 mL (1 mg/mL) premix infusion (has no administration in time range)  midazolam (VERSED) bolus via infusion 0-5 mg ( Intravenous MAR Hold 02-Apr-2022 1146)  fentaNYL (SUBLIMAZE) injection 25 mcg ( Intravenous MAR Hold 04/02/2022 1146)  fentaNYL 2542mg in NS 2571m(1079mml) infusion-PREMIX (has no administration in time range)  fentaNYL (SUBLIMAZE) bolus via infusion 25-100 mcg ( Intravenous MAR Hold 1/202/09/2444)  norepinephrine (LEVOPHED) 4mg67m 250mL82m016 mg/mL) premix infusion ( Intravenous MAR Hold 03/19/06-Feb-2024)  lidocaine (PF) (XYLOCAINE) 1 % injection (10 mLs  Given 03/19/00-09-2022)  EPINEPHrine (ADRENALIN) 5 mg in NS 250 mL (0.02 mg/mL) premix infusion (has no administration in time range)  heparin sodium (porcine) injection (6,000 Units Intravenous Given 03/19/06-Feb-2024)  midazolam (VERSED) injection (2 mg Intravenous Given 03/19/00-09-2022)  fentaNYL (SUBLIMAZE) injection (50 mcg Intravenous Given 03/19/00-09-2022)  EPINEPHrine (ADRENALIN) 5 mg in NS 250 mL (0.02 mg/mL) premix infusion (30 mcg/min Intravenous Rate/Dose Change 03/19/00-09-2022)  cangrelor (KENGREAL) bolus via infusion (2,178 mcg Intravenous Given 03/19/00-09-2022)  cangrelor (KENGREAL) 50,000 mcg in sodium chloride 0.9 % 250 mL (200 mcg/mL) infusion (4 mcg/kg/min  72.6 kg Intravenous New Bag/Given 03/19/00/09/2022)  sodium bicarbonate injection (100 mEq Intravenous Given 03/19/06-Feb-2024)  ceFEPIme (MAXIPIME) 1 g in sodium chloride 0.9 % 100 mL IVPB (has no administration in time range)  vancomycin (VANCOREADY) IVPB 1750 mg/350 mL (has no administration in time range)  ceFEPIme (MAXIPIME) 1 g in sodium chloride 0.9 % 100 mL IVPB (has no administration in time range)  docusate sodium (COLACE) capsule 100 mg (has no administration in time range)  polyethylene glycol (MIRALAX / GLYCOLAX) packet  17 g (has no administration in time range)  sodium bicarbonate 150 mEq in dextrose 5 % 1,150 mL infusion (has no administration  in time range)  ipratropium-albuterol (DUONEB) 0.5-2.5 (3) MG/3ML nebulizer solution 3 mL (3 mLs Nebulization Given 03/20/22 1032)  sodium chloride 0.9 % bolus 750 mL (750 mLs Intravenous New Bag/Given 03/20/22 1052)  aspirin chewable tablet 324 mg (324 mg Oral Given 03/20/22 1044)  ondansetron (ZOFRAN) injection 4 mg (4 mg Intravenous Given 2022/03/20 1047)  heparin injection 4,000 Units (4,000 Units Intravenous Given 03-20-22 1109)                                                                                                                                     Procedures .Critical Care  Performed by: Cristie Hem, MD Authorized by: Cristie Hem, MD   Critical care provider statement:    Critical care time (minutes):  75   Critical care was necessary to treat or prevent imminent or life-threatening deterioration of the following conditions:  Circulatory failure, cardiac failure, sepsis and shock   Critical care was time spent personally by me on the following activities:  Development of treatment plan with patient or surrogate, discussions with consultants, evaluation of patient's response to treatment, examination of patient, ordering and review of laboratory studies, ordering and review of radiographic studies, ordering and performing treatments and interventions, pulse oximetry, re-evaluation of patient's condition and review of old charts Procedure Name: Intubation Date/Time: March 20, 2022 12:37 PM  Performed by: Cristie Hem, MDPre-anesthesia Checklist: Patient identified, Patient being monitored, Emergency Drugs available, Timeout performed and Suction available Oxygen Delivery Method: Non-rebreather mask Preoxygenation: Pre-oxygenation with 100% oxygen Induction Type: Rapid sequence Ventilation: Mask ventilation without difficulty Laryngoscope Size: 3 and Mac Grade View: Grade I Number of attempts: 1 Airway Equipment and Method: Video-laryngoscopy Placement  Confirmation: ETT inserted through vocal cords under direct vision, CO2 detector and Breath sounds checked- equal and bilateral Secured at: 24 cm Tube secured with: ETT holder    Ultrasound ED Echo  Date/Time: 03/20/22 12:38 PM  Performed by: Cristie Hem, MD Authorized by: Cristie Hem, MD   Procedure details:    Indications: dyspnea     Views: subxiphoid and IVC view     Images: archived   Findings:    Pericardium: small pericardial effusion     LV Function: depressed (30 - 50%)     IVC: collapsed   Impression:    Impression: pericardial effusion present and probable low CVP     (including critical care time)  Medical Decision Making / ED Course   MDM:  88 year old male presenting with shortness of breath.  Patient ill-appearing with tachycardia, tachypnea, hypoxia on typical oxygen.  Initial EKG obtained by EMS without evidence of STEMI.  EKG obtained in the emergency department due to tachycardia and shortness of breath demonstrated inferior STEMI and code STEMI was activated.  Patient not having  chest pain.  Suspect patient likely has STEMI, given EKG changes, cardiology agrees and will take patient to the catheterization lab for further management.  Suspect patient also probably has a superimposed pneumonia, does have some left-sided lung sounds, elevated temperature, was tachycardic on arrival but not hypotensive.  No sign of pneumothorax.  Bedside ultrasound with collapsible IVC.  Fluid administered.  Given severe respiratory distress, patient not able to lie flat for cardiac catheterization, intubated in the emergency department, patient verbally consented for intubation.  Patient developed significant hypotension and complete heart block, required initiation of norepinephrine and epinephrine drips.  Cardiology consulted pulmonology and critical care for further management of his ventilator.  Patient started on cefepime and vancomycin.  Blood cultures  obtained.  Patient will be admitted for further management of his critical illness.  Updated daughter on guarded prognosis given age and vital sign abnormalities. Recevied heparin for ACS and aspirin      Additional history obtained: -Additional history obtained from ems -External records from outside source obtained and reviewed including: Chart review including previous notes, labs, imaging, consultation notes including admission December 2023   Lab Tests: -I ordered, reviewed, and interpreted labs.   The pertinent results include:   Labs Reviewed  COMPREHENSIVE METABOLIC PANEL - Abnormal; Notable for the following components:      Result Value   Sodium 134 (*)    Potassium 5.4 (*)    Glucose, Bld 109 (*)    Creatinine, Ser 4.49 (*)    Calcium 8.4 (*)    Total Protein 6.1 (*)    Albumin 2.9 (*)    Total Bilirubin 1.4 (*)    GFR, Estimated 12 (*)    All other components within normal limits  BRAIN NATRIURETIC PEPTIDE - Abnormal; Notable for the following components:   B Natriuretic Peptide 1,863.7 (*)    All other components within normal limits  CBC WITH DIFFERENTIAL/PLATELET - Abnormal; Notable for the following components:   WBC 3.9 (*)    RBC 4.18 (*)    Hemoglobin 12.6 (*)    Lymphs Abs 0.3 (*)    All other components within normal limits  PROTIME-INR - Abnormal; Notable for the following components:   Prothrombin Time 16.2 (*)    INR 1.3 (*)    All other components within normal limits  LACTIC ACID, PLASMA - Abnormal; Notable for the following components:   Lactic Acid, Venous 2.7 (*)    All other components within normal limits  I-STAT CHEM 8, ED - Abnormal; Notable for the following components:   Sodium 134 (*)    Potassium 5.3 (*)    BUN 26 (*)    Creatinine, Ser 4.80 (*)    Glucose, Bld 106 (*)    Calcium, Ion 0.94 (*)    All other components within normal limits  I-STAT VENOUS BLOOD GAS, ED - Abnormal; Notable for the following components:   pH, Ven  7.518 (*)    pCO2, Ven 31.0 (*)    Acid-Base Excess 3.0 (*)    Potassium 5.3 (*)    Calcium, Ion 0.98 (*)    All other components within normal limits  TROPONIN I (HIGH SENSITIVITY) - Abnormal; Notable for the following components:   Troponin I (High Sensitivity) 1,694 (*)    All other components within normal limits  CULTURE, BLOOD (ROUTINE X 2)  CULTURE, BLOOD (ROUTINE X 2)  RESP PANEL BY RT-PCR (RSV, FLU A&B, COVID)  RVPGX2  CULTURE, RESPIRATORY W GRAM STAIN  LACTIC  ACID, PLASMA  LACTIC ACID, PLASMA  LACTIC ACID, PLASMA  PROCALCITONIN  TROPONIN I (HIGH SENSITIVITY)    Notable for elevated troponin, signs of end stage renal disease  EKG   EKG Interpretation  Date/Time:  2022/04/07 11:18:54 EST Ventricular Rate:  57 PR Interval:  205 QRS Duration: 109 QT Interval:  411 QTC Calculation: 401 R Axis:   90 Text Interpretation: AV block, complete (third degree) Inferior infarct, acute (LCx) Anterior infarct, age indeterminate ST depression V1-V3, suggest recording posterior leads >>> Acute MI <<< Confirmed by Garnette Gunner [phone]) on 04-07-2022 11:36:54 AM         Imaging Studies ordered: I ordered imaging studies including CXR On my interpretation imaging demonstrates left sided infiltrate/effusion I independently visualized and interpreted imaging. I agree with the radiologist interpretation   Medicines ordered and prescription drug management: Meds ordered this encounter  Medications   ipratropium-albuterol (DUONEB) 0.5-2.5 (3) MG/3ML nebulizer solution 3 mL   DISCONTD: sodium chloride 0.9 % bolus 500 mL   sodium chloride 0.9 % bolus 750 mL   aspirin chewable tablet 324 mg   ondansetron (ZOFRAN) injection 4 mg   heparin 5000 UNIT/ML injection    Jardin, Carla G: cabinet override   heparin injection 4,000 Units   metoprolol tartrate (LOPRESSOR) injection 2.5 mg   PHENYLephrine 80 mcg/ml in normal saline Adult IV Push Syringe (For Blood Pressure  Support)   0.9 %  sodium chloride infusion   DISCONTD: norepinephrine (LEVOPHED) 52m in 2561m(0.016 mg/mL) premix infusion    Order Specific Question:   IV Access    Answer:   Peripheral   midazolam (VERSED) 100 mg/100 mL (1 mg/mL) premix infusion   midazolam (VERSED) bolus via infusion 0-5 mg   fentaNYL (SUBLIMAZE) injection 25 mcg   fentaNYL 250086min NS 250m77m0mc44m) infusion-PREMIX   fentaNYL (SUBLIMAZE) bolus via infusion 25-100 mcg   DISCONTD: EPINEPHrine (ADRENALIN) 5 mg in NS 250 mL (0.02 mg/mL) premix infusion   norepinephrine (LEVOPHED) 4mg i44m50mL (32m6 mg/mL) premix infusion   lidocaine (PF) (XYLOCAINE) 1 % injection   EPINEPHrine (ADRENALIN) 5 mg in NS 250 mL (0.02 mg/mL) premix infusion   heparin sodium (porcine) injection   midazolam (VERSED) injection   fentaNYL (SUBLIMAZE) injection   EPINEPHrine (ADRENALIN) 5 mg in NS 250 mL (0.02 mg/mL) premix infusion   cangrelor (KENGREAL) bolus via infusion   cangrelor (KENGREAL) 50,000 mcg in sodium chloride 0.9 % 250 mL (200 mcg/mL) infusion   sodium bicarbonate injection   ceFEPIme (MAXIPIME) 1 g in sodium chloride 0.9 % 100 mL IVPB    Order Specific Question:   Antibiotic Indication:    Answer:   HCAP   vancomycin (VANCOREADY) IVPB 1750 mg/350 mL    Order Specific Question:   Indication:    Answer:   Pneumonia   ceFEPIme (MAXIPIME) 1 g in sodium chloride 0.9 % 100 mL IVPB    Order Specific Question:   Antibiotic Indication:    Answer:   HCAP   docusate sodium (COLACE) capsule 100 mg   polyethylene glycol (MIRALAX / GLYCOLAX) packet 17 g   sodium bicarbonate 150 mEq in dextrose 5 % 1,150 mL infusion    Sodium Bicarbonate 150 mEq added to 1L D5W = 480 mOsm/L (effective osmolarity: 261 mOsm/L).    -I have reviewed the patients home medicines and have made adjustments as needed   Consultations Obtained: I requested consultation with the cardiologist,  and discussed lab and imaging findings as  well as pertinent plan  - they recommend: cardiac catheterization   Cardiac Monitoring: The patient was maintained on a cardiac monitor.  I personally viewed and interpreted the cardiac monitored which showed an underlying rhythm of: sinus tachycardia and complete heart block  Social Determinants of Health:  Diagnosis or treatment significantly limited by social determinants of health: former smoker   Reevaluation: After the interventions noted above, I reevaluated the patient and found that they have stayed the same  Co morbidities that complicate the patient evaluation  Past Medical History:  Diagnosis Date   Abnormal echocardiogram 03/08/2021   Abnormal finding on GI tract imaging    Acute blood loss anemia    Acute pancreatitis 08/12/2020   Arteriovenous fistula infection ([place]) 04/18/2021   Arthritis    Carotid stenosis    Community acquired pneumonia 04/06/2021   COPD (chronic obstructive pulmonary disease) ([place])    Coronary artery disease    S/p PCI 2011;  NSTEMI 12/12:  LHC/PCI 02/23/11: LAD 60% after the septal perforator, D1 occluded with distal collaterals, proximal RI 30-40%, AV circumflex stent patent with 60% stenosis after the stent, RCA 99%, EF 60-65%.  His RCA was treated with a bare-metal stent   CVA (cerebral infarction) 2011   Right cerebral; total obstruction of the right ICA   Diverticulitis    Hypertension    Paroxysmal atrial fibrillation with RVR ([place]) 04/09/2021   Partial small bowel obstruction ([place]) 08/17/2021   Pleuritic chest pain 04/08/2014   Pressure injury of skin 03/30/2021   Rash 03/05/2021   Rectal bleeding 07/2015   Refractory nausea and vomiting 06/20/2020   Renal carcinoma (H[facility])    Sepsis, unspecified organism ([place]) 04/11/2016   Stroke ([place])    Tobacco abuse, in remission    Unstable angina ([place]) 03/18/2011      Dispostion: Disposition decision including need for hospitalization was considered, and patient admitted to the hospital.    Final Clinical Impression(s)  / ED Diagnoses Final diagnoses:  Acute ST elevation myocardial infarction (STEMI), unspecified artery ([place])     This chart was dictated using voice recognition software.  Despite best efforts to proofread,  errors can occur which can change the documentation meaning.    Cristie Hem, MD 04-09-22 [phone]

## 2022-03-26 NOTE — Death Summary Note (Signed)
DEATH SUMMARY   Patient Details  Name: Wayne Meadows MRN: [id number] DOB: [birth year]  Admission/Discharge Information   Admit Date:  03-Apr-2022  Date of Death:  2022/04/03  Time of Death:  05-21-1514  Length of Stay: 0  Referring Physician: [name]   Reason(s) for Hospitalization  STEMI  Diagnoses  Preliminary cause of death:  STEMI Secondary Diagnoses (including complications and co-morbidities):  Principal Problem:   Acute MI [facility]) Active Problems:   ST elevation myocardial infarction involving right coronary artery [facility]) Cardiogenic shock ESRD Septic Shock Acute respiratory failure with hypoxemia   Brief Hospital Course (including significant findings, care, treatment, and services provided and events leading to death)  88 y/o male with a past medical history of ESRD, A-fib, COPD, prior cigarette smoker and known coronary artery disease presented to [facility] on 04-03-2022 in the context of an ST elevation MI. The patient resides in a nursing home. Upon my arrival and assessment of the patient he was intubated, sedated in the Cath Lab so he was unable to provide history. History was obtained by speaking with other caregivers and chart review. Briefly, the patient has been suffering from recurrent pneumonia and has been hospitalized several times at this facility recently. Today he presented in the context of shortness of breath and was found to have an ST elevation MI. In the Cath Lab he was noted to have near complete occlusion of his right coronary artery, and has been in profound shock. EMS noted a temperature of 100.6 in transport.   After he had PCI in the Cath Lab he remained in refractory shock.  He was transported to the cardiac intensive care unit where multiple vasopressors were administered including epinephrine, vasopressin and norepinephrine.  Multiple boluses of all vasopressors were administered, bicarbonate was administered and bolus and  infusion form, mechanical ventilation was used.  He was treated with vancomycin and cefepime for presumed healthcare associate pneumonia and a urinary tract infection.  Despite these interventions the patient's blood pressure continue to plummet and had refractory shock.  We discussed his overall poor prognosis with the patient's daughter who agreed with the recommendation for full comfort measures.  Patient's time of death was 84 on 04/03/22    Pertinent Labs and Studies  Significant Diagnostic Studies DG Chest 1 View  Result Date: 04/03/22 CLINICAL DATA:  Intubation EXAM: CHEST  1 VIEW COMPARISON:  Same day chest radiograph. FINDINGS: Interval intubation with the endotracheal tube terminating below the thoracic inlet and above the carina. Bilateral costophrenic angles are excluded from the field of view. Cardiac and mediastinal contours are unchanged. Persistent bibasilar pulmonary opacities, left-greater-than-right. IMPRESSION: Interval intubation with the endotracheal tube terminating below the thoracic inlet and above the carina. Electronically Signed   By: Marin Roberts M.D.   On: 2022/04/03 11:38   DG Chest Port 1 View  Result Date: Apr 03, 2022 CLINICAL DATA:  Respiratory distress EXAM: PORTABLE CHEST 1 VIEW COMPARISON:  Chest radiograph 01/24/2022 FINDINGS: The heart size is grossly stable. The upper mediastinal contours are normal. There is a moderate-sized pleural effusion with dense retrocardiac opacity, worsened since 01/24/2022. Additional perihilar opacity may reflect layering fluid or superimposed edema. There is no focal airspace disease on the right. There is no significant right effusion. There is no appreciable pneumothorax There is no acute osseous abnormality. IMPRESSION: Moderate-sized left pleural effusion with dense retrocardiac opacity worsened since 01/24/2022. Additional perihilar opacity may reflect layering fluid or superimposed edema. Electronically Signed  By:  Valetta Mole M.D.   On: Apr 14, 2022 10:40    Microbiology No results found for this or any previous visit (from the past 240 hour(s)).  Lab Basic Metabolic Panel: Recent Labs  Lab April 14, 2022 1049 04/14/22 1053 2022-04-14 1055 04-14-2022 1209 2022-04-14 1239 2022-04-14 1252 04/14/2022 1438  NA 134*   < > 134* 113* 137 137  137 138  K 5.4*   < > 5.3* 3.6 4.6 4.8  4.8 4.0  CL 98  --  100  --   --   --   --   CO2 23  --   --   --   --   --   --   GLUCOSE 109*  --  106*  --   --   --   --   BUN 20  --  26*  --   --   --   --   CREATININE 4.49*  --  4.80*  --   --   --   --   CALCIUM 8.4*  --   --   --   --   --   --    < > = values in this interval not displayed.   Liver Function Tests: Recent Labs  Lab 2022-04-14 1049  AST 25  ALT 6  ALKPHOS 107  BILITOT 1.4*  PROT 6.1*  ALBUMIN 2.9*   No results for input(s): "LIPASE", "AMYLASE" in the last 168 hours. No results for input(s): "AMMONIA" in the last 168 hours. CBC: Recent Labs  Lab Apr 14, 2022 1049 04-14-2022 1053 2022/04/14 1055 April 14, 2022 1209 04-14-22 1239 04/14/2022 1252 04/14/2022 1438  WBC 3.9*  --   --   --   --   --   --   NEUTROABS 3.5  --   --   --   --   --   --   HGB 12.6*   < > 14.6 12.6* 10.9* 11.2*  11.2* 11.2*  HCT 41.8   < > 43.0 37.0* 32.0* 33.0*  33.0* 33.0*  MCV 100.0  --   --   --   --   --   --   PLT 199  --   --   --   --   --   --    < > = values in this interval not displayed.   Cardiac Enzymes: No results for input(s): "CKTOTAL", "CKMB", "CKMBINDEX", "TROPONINI" in the last 168 hours. Sepsis Labs: Recent Labs  Lab April 14, 2022 1049  WBC 3.9*  LATICACIDVEN 2.7*    Procedures/Operations  Left heart cath Mechanical ventilation [facility] [place] cath   [facility] 14-Apr-2022, 3:15 PM

## 2022-03-26 NOTE — ED Triage Notes (Signed)
Pt BIB by [facility] from [facility]. Pt experiencing SHOB for a couple days. Pt normally on 3L nasal cannula O2. EMS heard expiratory wheezing. Pt takes medications for congestion. Pt took 4 puffs of his personal inhaler then took EMS duo neb. Pt has been tachy 130-140s. EMS reported pt is hot to the touch. Pt has been feeling nauseous as well. Pt already took his morning medications. He is a diaylsis pt (T,TH,S). He is right arm restricted. Full code.   LVS  132 HR  97% on Neb  20 RR  Temp 100.6  Cbg 142  BP 148/59

## 2022-03-26 NOTE — ED Notes (Signed)
Pt placed on NRB.

## 2022-03-26 NOTE — Progress Notes (Deleted)
Office Visit    Patient Name: Wayne Meadows Date of Encounter: 03/18/2022  Primary Care Provider:  Clinic, [name] Primary Cardiologist:  Sherren Mocha, MD Primary Electrophysiologist: None  Chief Complaint    Wayne Meadows is a 88 y.o. male with PMH of history of CAD s/p PCI with bare-metal stent to RCA, CVA with right ICA occlusion, PAF, ESRD, carotid disease, COPD, HTN, HLD who presents today for 56-monthfollow-up.  Past Medical History    Past Medical History:  Diagnosis Date   Abnormal echocardiogram 03/08/2021   Abnormal finding on GI tract imaging    Acute blood loss anemia    Acute pancreatitis 08/12/2020   Arteriovenous fistula infection ([facility] 04/18/2021   Arthritis    Carotid stenosis    Community acquired pneumonia 04/06/2021   COPD (chronic obstructive pulmonary disease) ([place]    Coronary artery disease    S/p PCI 2011;  NSTEMI 12/12:  LHC/PCI 02/23/11: LAD 60% after the septal perforator, D1 occluded with distal collaterals, proximal RI 30-40%, AV circumflex stent patent with 60% stenosis after the stent, RCA 99%, EF 60-65%.  His RCA was treated with a bare-metal stent   CVA (cerebral infarction) 2011   Right cerebral; total obstruction of the right ICA   Diverticulitis    Hypertension    Paroxysmal atrial fibrillation with RVR ([facility] 04/09/2021   Partial small bowel obstruction ([facility] 08/17/2021   Pleuritic chest pain 04/08/2014   Pressure injury of skin 03/30/2021   Rash 03/05/2021   Rectal bleeding 07/2015   Refractory nausea and vomiting 06/20/2020   Renal carcinoma ([place]    Sepsis, unspecified organism ([facility] 04/11/2016   Stroke ([facility]    Tobacco abuse, in remission    Unstable angina ([facility] 03/18/2011   Past Surgical History:  Procedure Laterality Date   AV FISTULA PLACEMENT Right 04/03/2021   Procedure: ARTERIOVENOUS (AV) CREATION OF RIGHT ARM BRACHIOCEPHALIC FISTULA;  Surgeon: HCherre Robins MD;  [place] [facility]  Service: Vascular;  Laterality:  Right;   BIOPSY  04/08/2021   Procedure: BIOPSY;  Surgeon: CLavena Bullion DO;  [place] [facility]  Service: Gastroenterology;;   BIOPSY  11/06/2021   Procedure: BIOPSY;  Surgeon: CLavena Bullion DO;  [place]  Service: Gastroenterology;;   COLON SURGERY     COLONOSCOPY N/A 08/22/2015   Procedure: COLONOSCOPY;  Surgeon: KMauri Pole MD;  [facility];  Service: Endoscopy;  Laterality: N/A;   ESOPHAGOGASTRODUODENOSCOPY N/A 07/18/2020   Procedure: ESOPHAGOGASTRODUODENOSCOPY (EGD);  Surgeon: JMilus Banister MD;  [facility];  Service: Endoscopy;  Laterality: N/A;   ESOPHAGOGASTRODUODENOSCOPY (EGD) WITH PROPOFOL N/A 06/21/2020   Procedure: ESOPHAGOGASTRODUODENOSCOPY (EGD) WITH PROPOFOL;  Surgeon: PIrene Shipper MD;  [facility]  Service: Endoscopy;  Laterality: N/A;   ESOPHAGOGASTRODUODENOSCOPY (EGD) WITH PROPOFOL N/A 04/08/2021   Procedure: ESOPHAGOGASTRODUODENOSCOPY (EGD) WITH PROPOFOL;  Surgeon: CLavena Bullion DO;  [place] [facility]  Service: Gastroenterology;  Laterality: N/A;   ESOPHAGOGASTRODUODENOSCOPY (EGD) WITH PROPOFOL N/A 11/06/2021   Procedure: ESOPHAGOGASTRODUODENOSCOPY (EGD) WITH PROPOFOL;  Surgeon: CLavena Bullion DO;  [place] [place]  Service: Gastroenterology;  Laterality: N/A;   EUS N/A 07/18/2020   Procedure: UPPER ENDOSCOPIC ULTRASOUND (EUS) RADIAL;  Surgeon: JMilus Banister MD;  [facility];  Service: Endoscopy;  Laterality: N/A;   FINE NEEDLE ASPIRATION N/A 07/18/2020   Procedure: FINE NEEDLE ASPIRATION (FNA) LINEAR;  Surgeon: JMilus Banister MD;  [facility];  Service: Endoscopy;  Laterality:  N/A;   HEMOSTASIS CLIP PLACEMENT  04/08/2021   Procedure: HEMOSTASIS CLIP PLACEMENT;  Surgeon: Lavena Bullion, DO;  Location: [place];  Service: Gastroenterology;;   hip relacement     INCISION AND DRAINAGE Right 09/11/2021   Procedure: INCISION AND DRAINAGE OF RIGHT ARM  FISTULA;  Surgeon: Cherre Robins, MD;  [facility];  Service: Vascular;  Laterality: Right;   IR FLUORO GUIDE CV LINE RIGHT  03/31/2021   IR REMOVAL TUN CV CATH W/O FL  08/15/2021   IR US GUIDE VASC ACCESS RIGHT  03/31/2021   KIDNEY SURGERY     LEFT HEART CATH AND CORONARY ANGIOGRAPHY N/A 07/09/2020   Procedure: LEFT HEART CATH AND CORONARY ANGIOGRAPHY;  Surgeon: Leonie Man, MD;  Location: [place] CV LAB;  Service: Cardiovascular;  Laterality: N/A;   LEFT HEART CATHETERIZATION WITH CORONARY ANGIOGRAM N/A 02/23/2011   Procedure: LEFT HEART CATHETERIZATION WITH CORONARY ANGIOGRAM;  Surgeon: [name], MD;  Location: [facility] CATH LAB;  Service: Cardiovascular;  Laterality: N/A;   LEFT HEART CATHETERIZATION WITH CORONARY ANGIOGRAM N/A 03/18/2011   Procedure: LEFT HEART CATHETERIZATION WITH CORONARY ANGIOGRAM;  Surgeon: [name], MD;  Location: [facility] CATH LAB;  Service: Cardiovascular;  Laterality: N/A;   PERCUTANEOUS CORONARY STENT INTERVENTION (PCI-S) N/A 02/23/2011   Procedure: PERCUTANEOUS CORONARY STENT INTERVENTION (PCI-S);  Surgeon: [name], MD;  Location: [facility] CATH LAB;  Service: Cardiovascular;  Laterality: N/A;   TEMPORARY PACEMAKER INSERTION N/A 02/23/2011   Procedure: TEMPORARY PACEMAKER INSERTION;  Surgeon: [name], MD;  Location: [facility] CATH LAB;  Service: Cardiovascular;  Laterality: N/A;   THROMBECTOMY W/ EMBOLECTOMY Right 04/03/2021   Procedure: THROMBECTOMY ARTERIOVENOUS FISTULA;  Surgeon: Cherre Robins, MD;  Location: [place];  Service: Vascular;  Laterality: Right;    Allergies  Allergies  Allergen Reactions   Bee Venom Anaphylaxis   Influenza Vaccines Other (See Comments)    "Mortally sick for 2 weeks"    History of Present Illness    Wayne Meadows  is a 88 year old male with the above mention past medical history who presents today for 57-monthfollow-up.  Mr. Wayne Bouchehas extensive cardiac history [facility]  and BMS to RCA implanted 01/2011 and total occlusion of small diagonal. Patient was placed on anticoagulation due to known hemorrhagic renal cyst.  He was admitted 02/2011 with unstable angina and repeat LHC was performed which revealed patent proximal to mid RCA stent and no significant changes from previous cath.  He was admitted 06/2020 with complaint of midsternal chest pain.  High-sensitivity troponins were negative and chest x-ray revealed COPD with no acute findings.  Cardiology was consulted and patient underwent repeat LHC that revealed stable three-vessel disease with a fully occluded first diagonal branch.   He was admitted 09/28/2021 with tachycardia and shortness of breath.  EKG completed and patient was found to be in atrial flutter with variable AV block.  2D echo was completed 02/2021 with EF of 65-70%, hyperdynamic with mild concentric LVH and grade 1 DD with mass noted along the atrial septum and mildly dilated RA with mild to moderate AV regurgitation.  He was treated with Cardizem and discharged with no further intervention. His CHA2DS2-VASc is 5 and he is at risk for embolic event.  He was readmitted on 10/21/2021 after missing dialysis session.  He was found to have respiratory failure with hypoxia.  Mr. Wayne Bouchewas seen on 12/18/2021 for 237-monthollow-up  and reported bouts of palpitations and was currently not on anticoagulation due to high fall risk with recent GI bleed.  I reached out to Dr. Burt Knack his primary cardiologist who agreed that he would be a good candidate for the [facility] program and he was referred for possible workup by Sammuel Bailiff, RN.  Patient was contacted several times but did not return call and letter was sent regarding consultation.  He was admitted 01/20/2022 with acute on chronic hypoxic respiratory failure and PAF with RVR.  He was felt to have pulmonary edema due to several missed HD sessions and COPD exacerbation.  Chest x-ray was completed showing increased pulmonary  edema and left sided pleural effusion.  He was placed on BiPAP for several days and then was transition to as needed usage.  He showed significant improvement following HD sessions and had spontaneous conversion of AF with RVR.  He was discharged 01/27/2022 to [facility].   Since last being seen in the office patient reports***.  Patient denies chest pain, palpitations, dyspnea, PND, orthopnea, nausea, vomiting, dizziness, syncope, edema, weight gain, or early satiety.     ***Notes: -Patient was recently admitted with pulmonary edema and COPD exacerbation after missing several HD visits. -Follow-up and referral to Dr. Burt Knack for Watchman? -Rate control with atrial fibrillation Home Medications    Current Outpatient Medications  Medication Sig Dispense Refill   acetaminophen (TYLENOL) 325 MG tablet Take 2 tablets (650 mg total) by mouth every 6 (six) hours as needed for mild pain (or Fever >/= 101). (Patient taking differently: Take 650 mg by mouth every 6 (six) hours as needed for mild pain (fever > 101F).)     Amino Acids-Protein Hydrolys (FEEDING SUPPLEMENT, PRO-STAT SUGAR FREE 64,) LIQD Take 30 mLs by mouth every evening.     antiseptic oral rinse (BIOTENE) LIQD 10 mLs by Mouth Rinse route 2 (two) times daily.     benzonatate (TESSALON) 100 MG capsule Take 1 capsule (100 mg total) by mouth 3 (three) times daily. 20 capsule 0   busPIRone (BUSPAR) 5 MG tablet Take 1 tablet (5 mg total) by mouth 3 (three) times daily. (0800 & 2000) (Patient taking differently: Take 5 mg by mouth 3 (three) times daily.) 60 tablet 0   Darbepoetin Alfa (ARANESP) 60 MCG/0.3ML SOSY injection Inject 0.3 mLs (60 mcg total) into the vein every Monday with hemodialysis. (Patient not taking: Reported on 01/20/2022) 4.2 mL    dextromethorphan-guaiFENesin (MUCINEX DM) 30-600 MG 12hr tablet Take 1 tablet by mouth 2 (two) times daily. 60 tablet 0   diltiazem (CARDIZEM) 60 MG tablet Take 1 tablet (60 mg total) by mouth  3 (three) times daily.     HYDROcodone-acetaminophen (NORCO) 10-325 MG tablet Take 1 tablet by mouth every 6 (six) hours as needed for severe pain or moderate pain. 2 tablet 0   hydrOXYzine (ATARAX) 10 MG tablet Take 10 mg by mouth every 6 (six) hours as needed for itching.     Ipratropium-Albuterol (COMBIVENT RESPIMAT) 20-100 MCG/ACT AERS respimat Inhale 1 puff into the lungs 4 (four) times daily.     ipratropium-albuterol (DUONEB) 0.5-2.5 (3) MG/3ML SOLN Take 3 mLs by nebulization every 4 (four) hours as needed (shortness of breath, wheezing).     levothyroxine (SYNTHROID) 75 MCG tablet Take 75 mcg by mouth in the morning. (0630)     melatonin 5 MG TABS Take 5 mg by mouth at bedtime.     Menthol, Topical Analgesic, (BIOFREEZE) 4 % GEL Apply 1 application  topically 3 (three) times daily as needed (topical analgesic).     metoprolol tartrate (LOPRESSOR) 25 MG tablet Take 0.5 tablets (12.5 mg total) by mouth 2 (two) times daily. 15 tablet 0   Naphazoline-Glycerin (CLEAR EYES REDNESS RELIEF OP) Place 1-2 drops into the right eye 3 (three) times daily.     Nutritional Supplements (NOVASOURCE RENAL) LIQD Take 237 mLs by mouth 2 (two) times daily.     ondansetron (ZOFRAN) 4 MG tablet Take 4 mg by mouth every 6 (six) hours as needed for nausea or vomiting.     pantoprazole (PROTONIX) 40 MG tablet Take 40 mg by mouth in the morning.     polyethylene glycol (MIRALAX / GLYCOLAX) 17 g packet Take 17 g by mouth daily. (Patient taking differently: Take 17 g by mouth in the morning.) 14 each 0   pravastatin (PRAVACHOL) 40 MG tablet Take 40 mg by mouth at bedtime.     senna (SENOKOT) 8.6 MG TABS tablet Take 8.6 mg by mouth 2 (two) times daily.     sevelamer carbonate (RENVELA) 800 MG tablet Take 1,600 mg by mouth with breakfast, with lunch, and with evening meal.     sodium chloride (OCEAN) 0.65 % SOLN nasal spray Place 2 sprays into both nostrils 2 (two) times daily.     tamsulosin (FLOMAX) 0.4 MG CAPS capsule  Take 1 capsule (0.4 mg total) by mouth daily. (Patient taking differently: Take 0.4 mg by mouth in the morning.) 30 capsule    No current facility-administered medications for this visit.     Review of Systems  Please see the history of present illness.    (+)*** (+)***  All other systems reviewed and are otherwise negative except as noted above.  Physical Exam    Wt Readings from Last 3 Encounters:  01/27/22 148 lb 13 oz (67.5 kg)  12/31/21 160 lb (72.6 kg)  12/18/21 160 lb 12.8 oz (72.9 kg)   [id number] were no vitals filed for this visit.,There is no height or weight on file to calculate BMI.  Constitutional:      Appearance: Healthy appearance. Not in distress.  Neck:     Vascular: JVD normal.  Pulmonary:     Effort: Pulmonary effort is normal.     Breath sounds: No wheezing. No rales. Diminished in the bases Cardiovascular:     Normal rate. Regular rhythm. Normal S1. Normal S2.      Murmurs: There is no murmur.  Edema:    Peripheral edema absent.  Abdominal:     Palpations: Abdomen is soft non tender. There is no hepatomegaly.  Skin:    General: Skin is warm and dry.  Neurological:     General: No focal deficit present.     Mental Status: Alert and oriented to person, place and time.     Cranial Nerves: Cranial nerves are intact.  EKG/LABS/Other Studies Reviewed    ECG personally reviewed by me today - ***  Risk Assessment/Calculations:   {Does this patient have ATRIAL FIBRILLATION?:[phone]}        Lab Results  Component Value Date   WBC 10.6 (H) 01/27/2022   HGB 11.4 (L) 01/27/2022   HCT 35.3 (L) 01/27/2022   MCV 93.6 01/27/2022   PLT 258 01/27/2022   Lab Results  Component Value Date   CREATININE 3.87 (H) 01/27/2022   BUN 36 (H) 01/27/2022   NA 134 (L) 01/27/2022   K 3.8 01/27/2022   CL 95 (L) 01/27/2022   CO2 27  01/27/2022   Lab Results  Component Value Date   ALT 12 01/21/2022   AST 17 01/21/2022   ALKPHOS 113 01/21/2022   BILITOT 1.8  (H) 01/21/2022   Lab Results  Component Value Date   CHOL 65 08/12/2020   HDL 27 (L) 08/12/2020   LDLCALC 23 08/12/2020   TRIG 74 08/12/2020   CHOLHDL 2.4 08/12/2020    Lab Results  Component Value Date   HGBA1C 5.3 01/25/2022    Assessment & Plan    1.  Paroxysmal atrial flutter: -Patient is currently rate controlled by auscultation today with rate of 84 bpm.  He is compliant with his current regimen of metoprolol 12.5 mg twice daily  and Cardizem 240 mg -He is currently not on anticoagulation due to increased fall risk -Patient's CHA2DS2-VASc score is 5, I will follow-up with Dr. Burt Knack regarding patient's need for anticoagulation or possible referral for watchman.  Several attempts made to reach patient with letter sent however patient did not respond back.   2.  Essential hypertension: -Patient's blood pressure today was*** -Continue current antihypertensive regimen as prescribed   3.  Coronary artery disease: -CAD s/p PCI with bare-metal stent to RCA, CVA with right ICA occlusion -Today patient denies any chest pain or anginal equivalent -Continue pravastatin 40 mg, metoprolol 12.5 mg, Plavix 75 mg daily   4.  ESRD: -Patient has history of end-stage renal disease and is currently dialyzed Monday Wednesday and Friday -Current treatment plan per nephrology    5. History of gastric/duodenal ulcers, Barrett's esophagus - No active GI bleeding noted, On PPI       Disposition: Follow-up with Sherren Mocha, MD or APP in *** months {Are you ordering a CV Procedure (e.g. stress test, cath, DCCV, TEE, etc)?   Press F2        :[id number]   Medication Adjustments/Labs and Tests Ordered: Current medicines are reviewed at length with the patient today.  Concerns regarding medicines are outlined above.   Signed, Mable Fill, Marissa Nestle, NP 03/18/2022, 9:43 AM [facility]  Note:  This document was prepared using Dragon voice recognition software and may  include unintentional dictation errors.

## 2022-03-26 NOTE — Progress Notes (Signed)
   March 27, 2022 1100  Spiritual Encounters  Type of Visit Initial  Care provided to: Patient  Referral source Code page  Reason for visit Code  OnCall Visit No   Ch responded to code STEMI. Pt's daughter is on the way but doesn't know when she will be there. Pt is on the way to the Cath Lab. No follow-up needed at this time.

## 2022-03-26 NOTE — Progress Notes (Incomplete)
Pharmacy Antibiotic Note  Wayne Meadows is a 88 y.o. male admitted on 06-Apr-2022 with pneumonia.  Pharmacy has been consulted for vancomycin and cefepime dosing.  Presented with SOB. WBC 3.9, LA 2.7, Scr 4.8 - hx ESRD. CXR showing moderate sized L pleural effusion w/ perihilar opacity.   Plan: {Assessment:21075}  Height: 6' (182.9 cm) Weight: 72.6 kg (160 lb) IBW/kg (Calculated) : 77.6  No data recorded.  Recent Labs  Lab April 06, 2022 1049 04/06/2022 1055  WBC 3.9*  --   CREATININE  --  4.80*    Estimated Creatinine Clearance: 11.6 mL/min (A) (by C-G formula based on SCr of 4.8 mg/dL (H)).    Allergies  Allergen Reactions   Bee Venom Anaphylaxis   Influenza Vaccines Other (See Comments)    "Mortally sick for 2 weeks"    Antimicrobials this admission: Vancomycin 06-Apr-2022 >>  Cefepime 06-Apr-2022 >>   Dose adjustments this admission: N/A  Microbiology results: 06-Apr-2022 BCx: sent 04-06-2022 Sputum: sent  04-06-2022 COVID/Flu PCR: sent  Thank you for allowing pharmacy to be a part of this patient's care.  Antonietta Jewel, PharmD, [place] Clinical Pharmacist  Phone: [phone] 2022-04-06 12:30 PM  Please check AMION for all [place] phone numbers After 10:00 PM, call [place] [phone]

## 2022-03-26 NOTE — Progress Notes (Signed)
Vent turned off at 1514 after TOD called.

## 2022-03-26 DEATH — deceased

## 2022-05-12 ENCOUNTER — Ambulatory Visit: Payer: Medicare Other | Admitting: Podiatry

## 2023-10-14 IMAGING — CR DG CHEST 2V
3 series · 3 of 3 positions shown · non-contrast
Comparison: Chest x-ray 07/08/2020, CT chest 08/12/2020

CLINICAL DATA: Bilateral leg swelling.  Shortness of breath.

EXAM:
CHEST - 2 VIEW

[w chest lat]
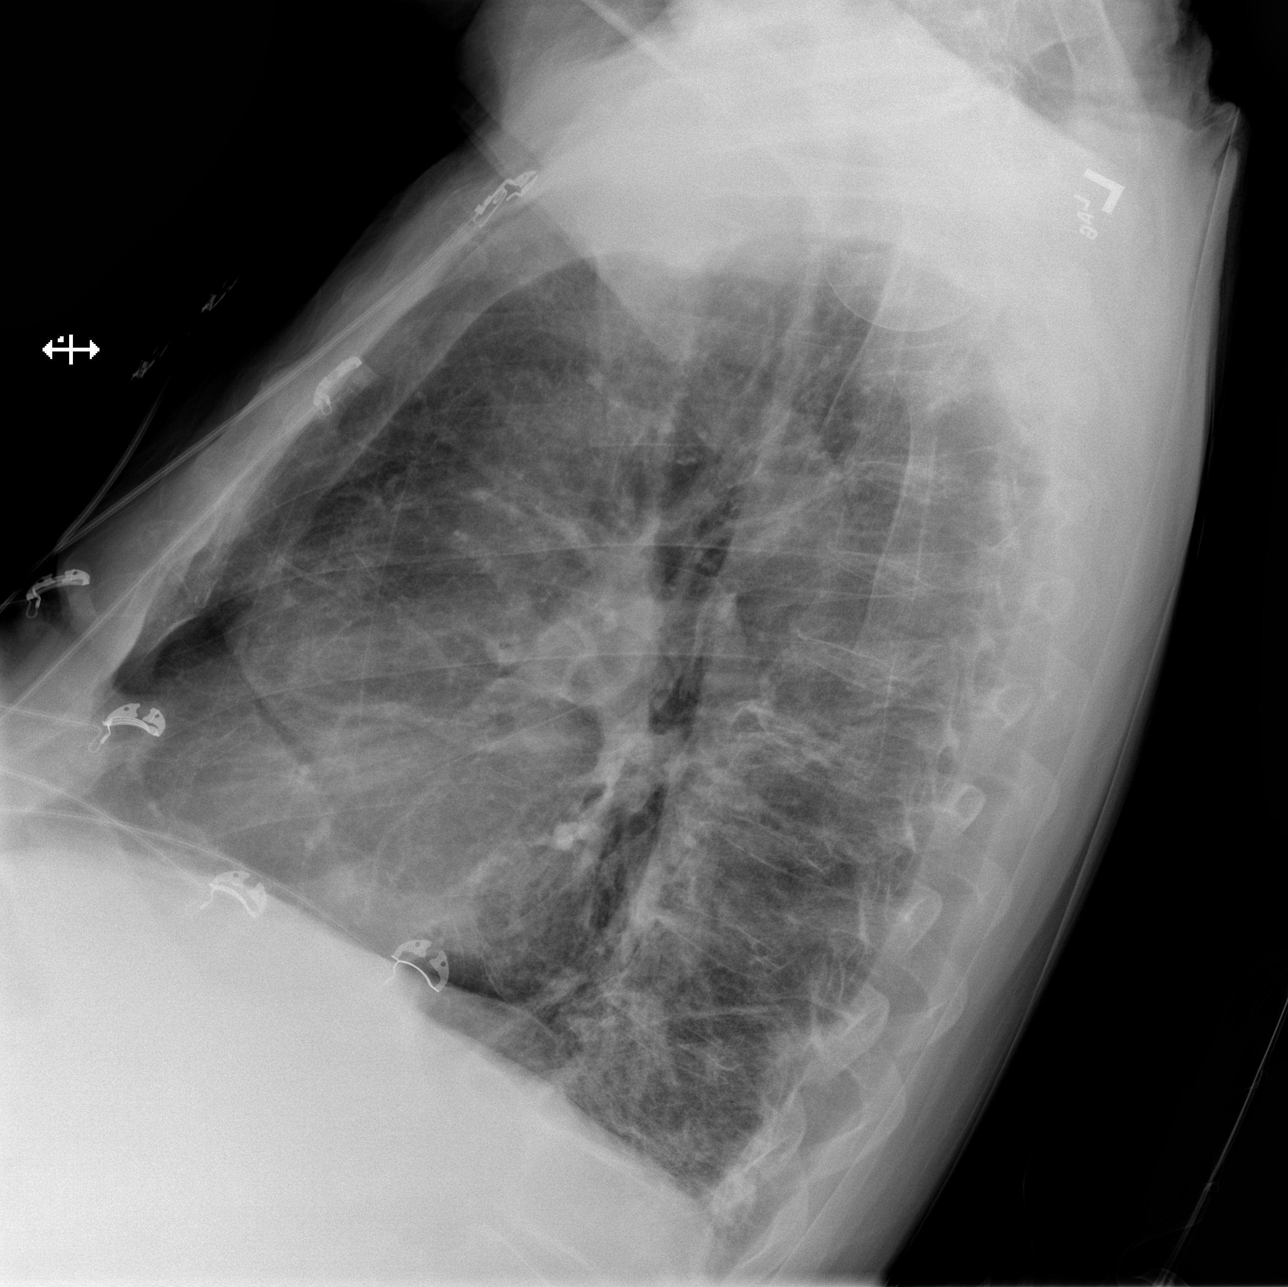

[x chest ap (1 of 2)]
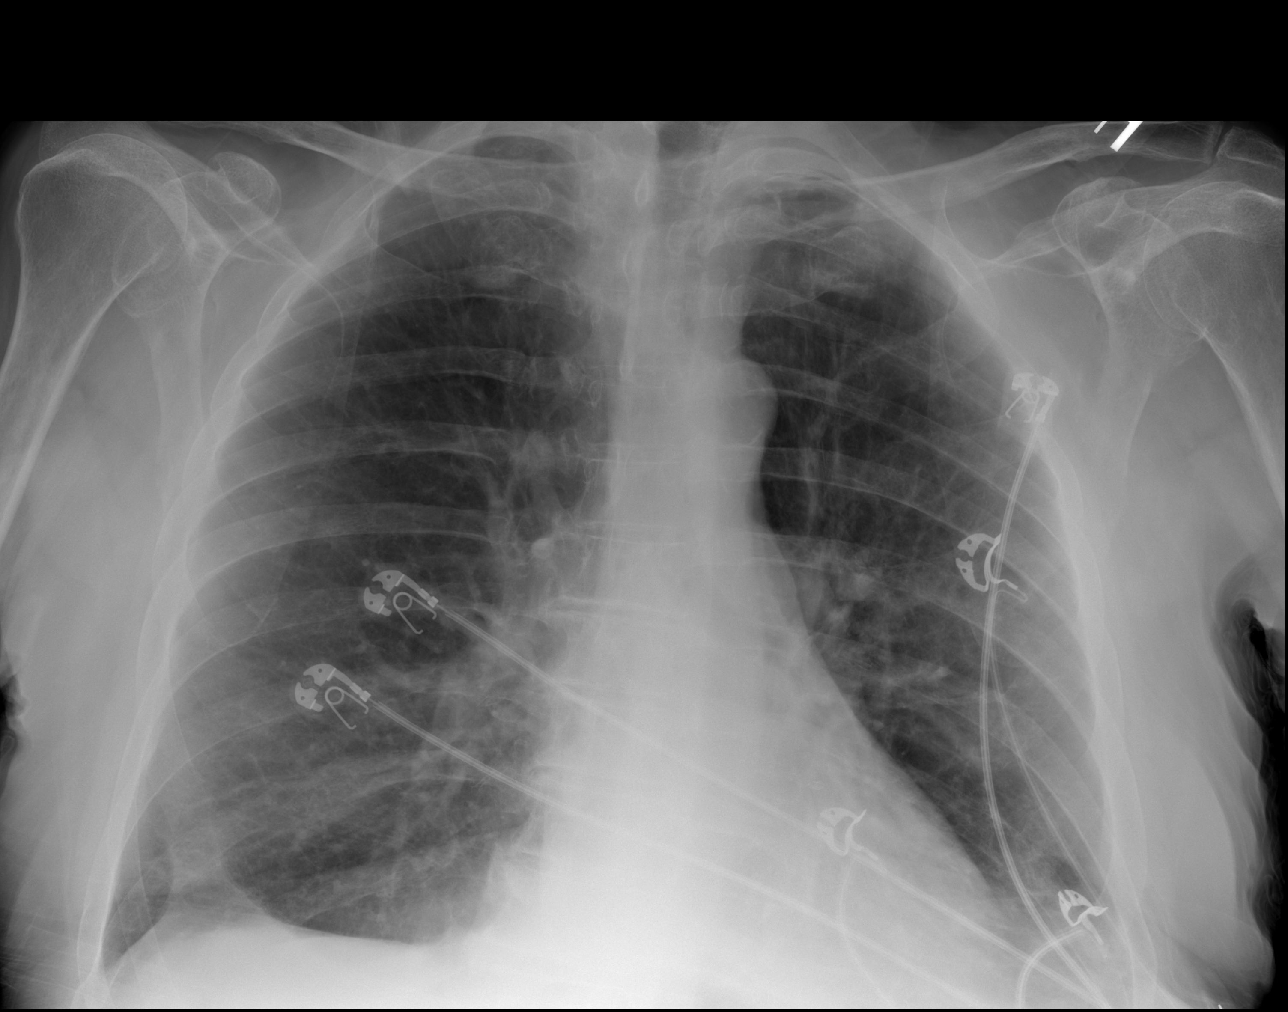

[x chest ap (2 of 2)]
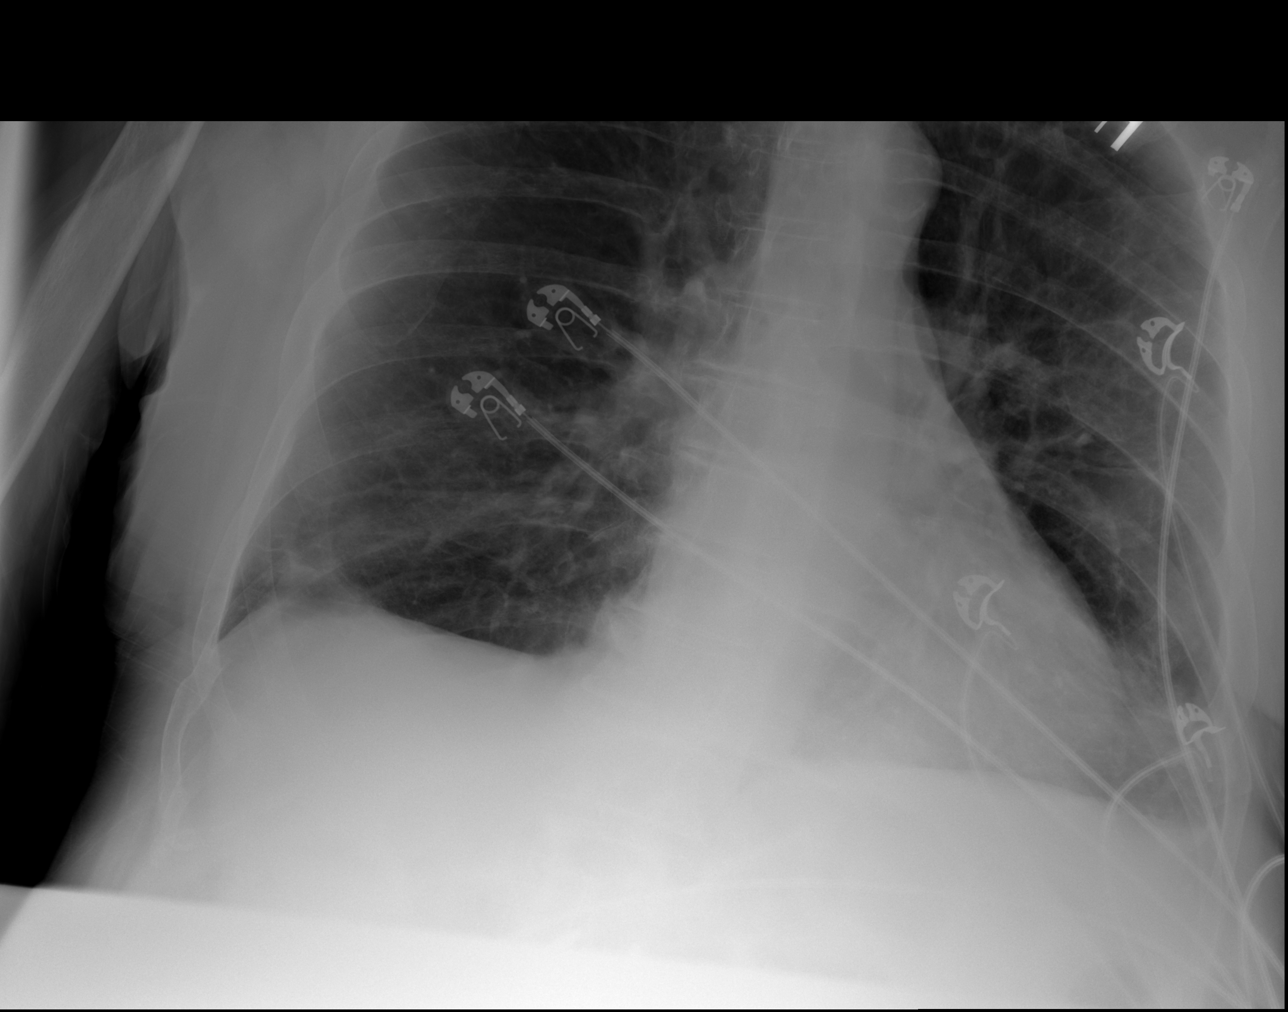

[3 of 3 positions shown; findings below may reference images not displayed]

FINDINGS: The heart and mediastinal contours are unchanged. Aortic
calcification.

Right base atelectasis. No focal consolidation. No pulmonary edema.
No right pleural effusion. Interval development of a trace left
pleural effusion. No pneumothorax.

No acute osseous abnormality.
IMPRESSION: 1. Interval development of a trace left pleural effusion.
2.  Aortic Atherosclerosis ([id number]).

## 2024-01-29 IMAGING — DX DG CHEST 2V
2 series · 2 of 2 positions shown · non-contrast
Comparison: 04/06/2021

CLINICAL DATA: Questionable sepsis

EXAM:
CHEST - 2 VIEW

[chest lat]
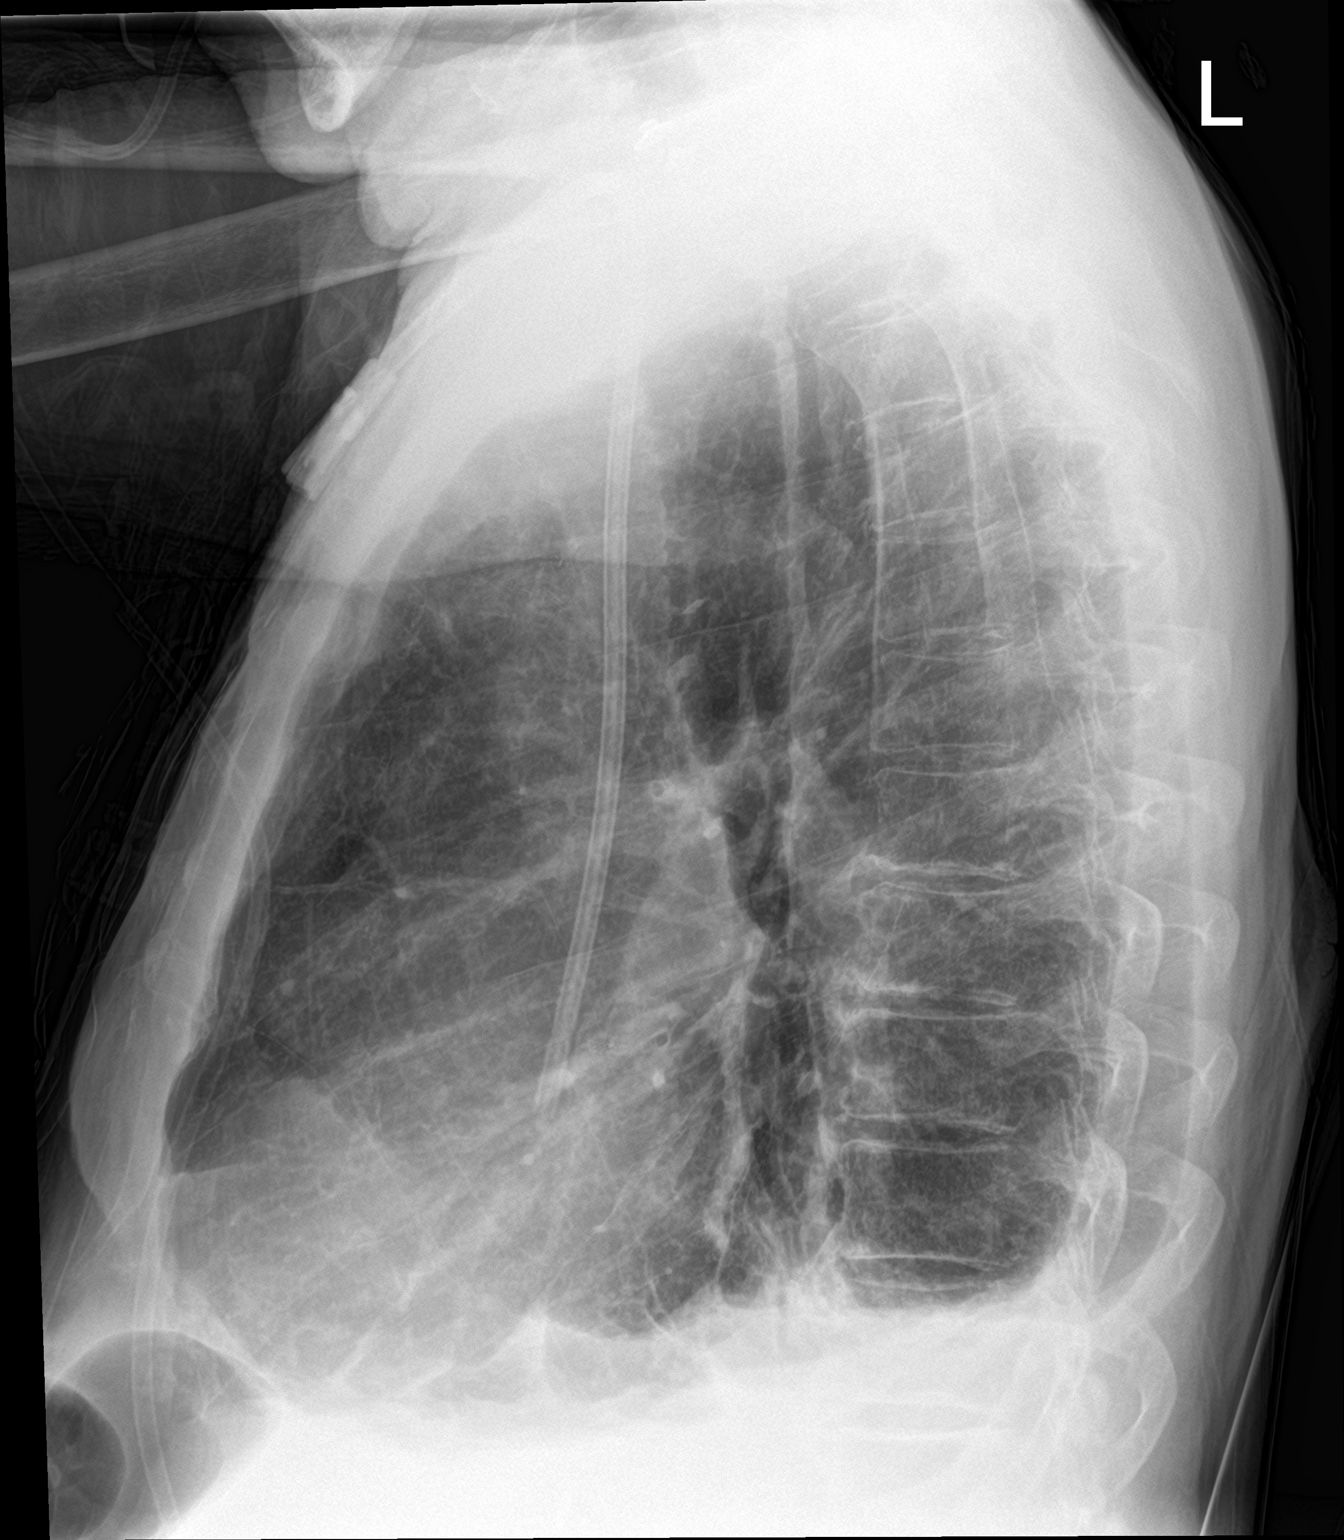

[chest ap]
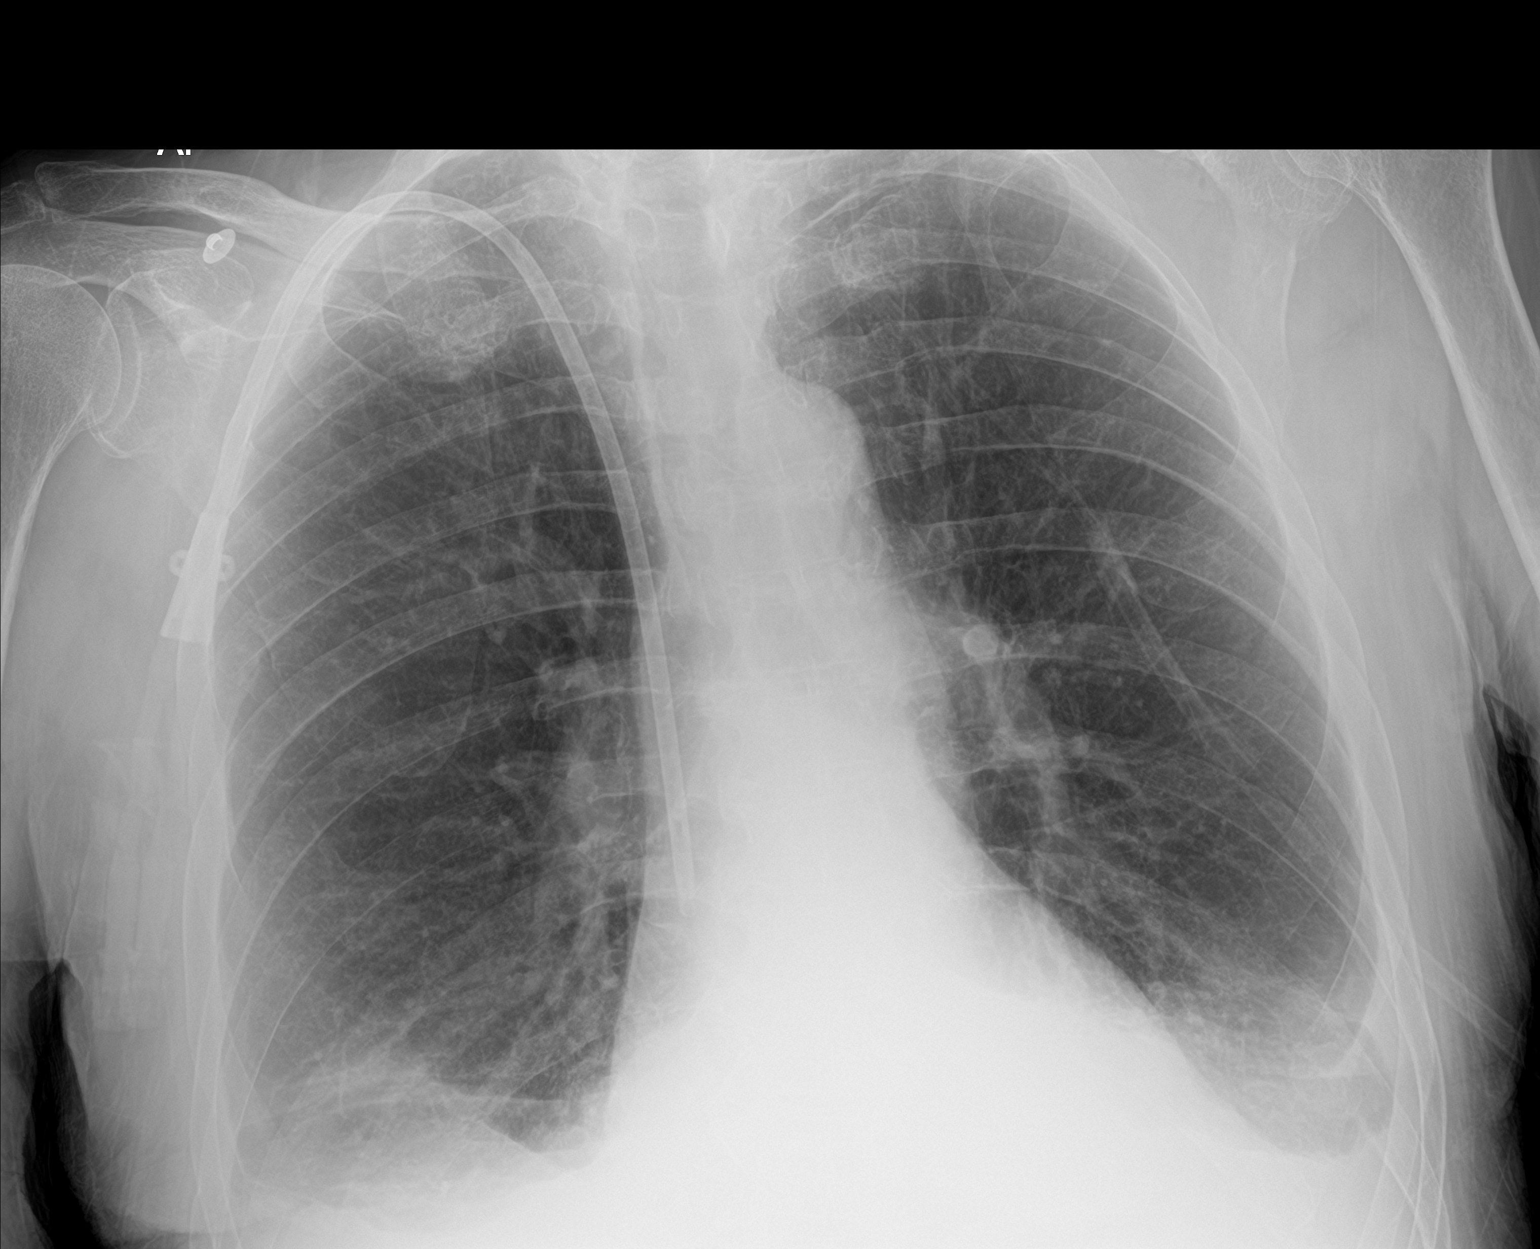

[2 of 2 positions shown; findings below may reference images not displayed]

FINDINGS: Right dialysis catheter remains in place, unchanged. Small bilateral
pleural effusions. Bibasilar atelectasis or infiltrates. Findings
similar to prior study. Heart is normal size. No acute bony
abnormality.
IMPRESSION: Small bilateral pleural effusions with bibasilar atelectasis or
infiltrates.
